# Patient Record
Sex: Male | Born: 1950
Health system: Southern US, Community
[De-identification: ages and names within clinical notes are randomized; demographics above are authoritative.]

## PROBLEM LIST (undated history)

## (undated) ENCOUNTER — Emergency Department (HOSPITAL_COMMUNITY): Admission: EM

## (undated) ENCOUNTER — Ambulatory Visit: Admission: EM | Payer: PPO

## (undated) DIAGNOSIS — N200 Calculus of kidney: Secondary | ICD-10-CM

## (undated) DIAGNOSIS — E119 Type 2 diabetes mellitus without complications: Secondary | ICD-10-CM

## (undated) DIAGNOSIS — H3530 Unspecified macular degeneration: Secondary | ICD-10-CM

## (undated) DIAGNOSIS — S3992XA Unspecified injury of lower back, initial encounter: Secondary | ICD-10-CM

## (undated) DIAGNOSIS — I2510 Atherosclerotic heart disease of native coronary artery without angina pectoris: Secondary | ICD-10-CM

## (undated) DIAGNOSIS — I509 Heart failure, unspecified: Secondary | ICD-10-CM

## (undated) DIAGNOSIS — M1990 Unspecified osteoarthritis, unspecified site: Secondary | ICD-10-CM

## (undated) DIAGNOSIS — R0683 Snoring: Secondary | ICD-10-CM

## (undated) DIAGNOSIS — I10 Essential (primary) hypertension: Secondary | ICD-10-CM

## (undated) DIAGNOSIS — Z794 Long term (current) use of insulin: Secondary | ICD-10-CM

## (undated) DIAGNOSIS — F329 Major depressive disorder, single episode, unspecified: Secondary | ICD-10-CM

## (undated) DIAGNOSIS — E785 Hyperlipidemia, unspecified: Secondary | ICD-10-CM

## (undated) DIAGNOSIS — F32A Depression, unspecified: Secondary | ICD-10-CM

## (undated) DIAGNOSIS — I119 Hypertensive heart disease without heart failure: Secondary | ICD-10-CM

## (undated) HISTORY — DX: Unspecified osteoarthritis, unspecified site: M19.90

## (undated) HISTORY — DX: Major depressive disorder, single episode, unspecified: F32.9

## (undated) HISTORY — PX: CORONARY ANGIOPLASTY: SHX604

## (undated) HISTORY — DX: Snoring: R06.83

## (undated) HISTORY — DX: Hyperlipidemia, unspecified: E78.5

## (undated) HISTORY — DX: Type 2 diabetes mellitus without complications: E11.9

## (undated) HISTORY — DX: Unspecified injury of lower back, initial encounter: S39.92XA

## (undated) HISTORY — DX: Calculus of kidney: N20.0

## (undated) HISTORY — DX: Essential (primary) hypertension: I10

## (undated) HISTORY — PX: CORONARY ARTERY BYPASS GRAFT: SHX141

## (undated) HISTORY — DX: Depression, unspecified: F32.A

## (undated) HISTORY — DX: Long term (current) use of insulin: Z79.4

## (undated) HISTORY — PX: CIRCUMCISION: SUR203

## (undated) HISTORY — DX: Hypertensive heart disease without heart failure: I11.9

---

## 1999-12-03 ENCOUNTER — Encounter: Payer: Self-pay | Admitting: Emergency Medicine

## 1999-12-03 ENCOUNTER — Emergency Department (HOSPITAL_COMMUNITY): Admission: EM | Admit: 1999-12-03 | Discharge: 1999-12-03 | Payer: Self-pay | Admitting: Emergency Medicine

## 2000-09-29 HISTORY — PX: CARDIAC CATHETERIZATION: SHX172

## 2000-10-04 ENCOUNTER — Inpatient Hospital Stay (HOSPITAL_COMMUNITY): Admission: EM | Admit: 2000-10-04 | Discharge: 2000-10-06 | Payer: Self-pay | Admitting: Emergency Medicine

## 2000-10-04 ENCOUNTER — Encounter: Payer: Self-pay | Admitting: Emergency Medicine

## 2002-02-26 DIAGNOSIS — S3992XA Unspecified injury of lower back, initial encounter: Secondary | ICD-10-CM

## 2002-02-26 HISTORY — DX: Unspecified injury of lower back, initial encounter: S39.92XA

## 2002-08-29 ENCOUNTER — Encounter: Payer: Self-pay | Admitting: Family Medicine

## 2002-08-29 LAB — CONVERTED CEMR LAB: PSA: 0.2 ng/mL

## 2002-10-04 ENCOUNTER — Encounter: Admission: RE | Admit: 2002-10-04 | Discharge: 2002-10-04 | Payer: Self-pay | Admitting: Family Medicine

## 2002-10-04 ENCOUNTER — Encounter: Payer: Self-pay | Admitting: Family Medicine

## 2002-10-18 ENCOUNTER — Encounter: Payer: Self-pay | Admitting: Family Medicine

## 2002-10-18 ENCOUNTER — Encounter: Admission: RE | Admit: 2002-10-18 | Discharge: 2002-10-18 | Payer: Self-pay | Admitting: Family Medicine

## 2003-07-21 ENCOUNTER — Ambulatory Visit (HOSPITAL_COMMUNITY): Admission: RE | Admit: 2003-07-21 | Discharge: 2003-07-23 | Payer: Self-pay | Admitting: Neurosurgery

## 2006-10-28 ENCOUNTER — Encounter: Payer: Self-pay | Admitting: Family Medicine

## 2006-10-28 LAB — CONVERTED CEMR LAB
Hgb A1c MFr Bld: 11.5 %
Microalbumin U total vol: NEGATIVE mg/L
PSA: 0.21 ng/mL

## 2006-11-21 ENCOUNTER — Ambulatory Visit: Payer: Self-pay | Admitting: Family Medicine

## 2006-11-21 LAB — CONVERTED CEMR LAB
ALT: 22 units/L (ref 0–40)
AST: 16 units/L (ref 0–37)
Albumin: 3.8 g/dL (ref 3.5–5.2)
Alkaline Phosphatase: 71 units/L (ref 39–117)
BUN: 14 mg/dL (ref 6–23)
Bilirubin, Direct: 0.1 mg/dL (ref 0.0–0.3)
CO2: 28 meq/L (ref 19–32)
Calcium: 9 mg/dL (ref 8.4–10.5)
Chloride: 101 meq/L (ref 96–112)
Cholesterol: 245 mg/dL (ref 0–200)
Creatinine, Ser: 0.9 mg/dL (ref 0.4–1.5)
Creatinine,U: 64.1 mg/dL
Direct LDL: 156.6 mg/dL
GFR calc Af Amer: 112 mL/min
GFR calc non Af Amer: 92 mL/min
Glucose, Bld: 354 mg/dL — ABNORMAL HIGH (ref 70–99)
HDL: 33.3 mg/dL — ABNORMAL LOW (ref >39.0)
Hgb A1c MFr Bld: 11.5 % — ABNORMAL HIGH (ref 4.6–6.0)
Microalb Creat Ratio: 14 mg/g (ref 0.0–30.0)
Microalb, Ur: 0.9 mg/dL (ref 0.0–1.9)
PSA: 0.21 ng/mL (ref 0.10–4.00)
Potassium: 5.1 meq/L (ref 3.5–5.1)
Sodium: 136 meq/L (ref 135–145)
TSH: 2.28 microintl units/mL (ref 0.35–5.50)
Total Bilirubin: 0.8 mg/dL (ref 0.3–1.2)
Total CHOL/HDL Ratio: 7.4
Total Protein: 6.9 g/dL (ref 6.0–8.3)
Triglycerides: 276 mg/dL (ref 0–149)
VLDL: 55 mg/dL — ABNORMAL HIGH (ref 0–40)

## 2006-12-05 ENCOUNTER — Ambulatory Visit: Payer: Self-pay | Admitting: Family Medicine

## 2006-12-05 ENCOUNTER — Encounter: Payer: Self-pay | Admitting: Family Medicine

## 2006-12-05 DIAGNOSIS — E785 Hyperlipidemia, unspecified: Secondary | ICD-10-CM

## 2006-12-05 DIAGNOSIS — E1159 Type 2 diabetes mellitus with other circulatory complications: Secondary | ICD-10-CM | POA: Insufficient documentation

## 2006-12-05 DIAGNOSIS — I10 Essential (primary) hypertension: Secondary | ICD-10-CM

## 2006-12-05 DIAGNOSIS — M1990 Unspecified osteoarthritis, unspecified site: Secondary | ICD-10-CM | POA: Insufficient documentation

## 2006-12-05 DIAGNOSIS — E1169 Type 2 diabetes mellitus with other specified complication: Secondary | ICD-10-CM | POA: Insufficient documentation

## 2006-12-14 ENCOUNTER — Ambulatory Visit: Payer: Self-pay | Admitting: Family Medicine

## 2006-12-14 LAB — CONVERTED CEMR LAB
BUN: 14 mg/dL (ref 6–23)
CO2: 27 meq/L (ref 19–32)
Calcium: 9.4 mg/dL (ref 8.4–10.5)
Chloride: 95 meq/L — ABNORMAL LOW (ref 96–112)
Creatinine, Ser: 1 mg/dL (ref 0.4–1.5)
GFR calc Af Amer: 99 mL/min
GFR calc non Af Amer: 82 mL/min
Glucose, Bld: 413 mg/dL — ABNORMAL HIGH (ref 70–99)
Potassium: 4.9 meq/L (ref 3.5–5.1)
Sodium: 134 meq/L — ABNORMAL LOW (ref 135–145)

## 2007-03-26 ENCOUNTER — Encounter: Payer: Self-pay | Admitting: Family Medicine

## 2007-03-26 DIAGNOSIS — Z87442 Personal history of urinary calculi: Secondary | ICD-10-CM | POA: Insufficient documentation

## 2007-04-10 ENCOUNTER — Ambulatory Visit: Payer: Self-pay | Admitting: Family Medicine

## 2007-04-10 DIAGNOSIS — F528 Other sexual dysfunction not due to a substance or known physiological condition: Secondary | ICD-10-CM | POA: Insufficient documentation

## 2007-04-11 ENCOUNTER — Ambulatory Visit: Payer: Self-pay | Admitting: Family Medicine

## 2007-04-13 LAB — CONVERTED CEMR LAB
BUN: 19 mg/dL (ref 6–23)
CO2: 28 meq/L (ref 19–32)
Calcium: 9.7 mg/dL (ref 8.4–10.5)
Chloride: 104 meq/L (ref 96–112)
Cholesterol: 235 mg/dL (ref 0–200)
Creatinine, Ser: 0.9 mg/dL (ref 0.4–1.5)
Direct LDL: 163.8 mg/dL
GFR calc Af Amer: 112 mL/min
GFR calc non Af Amer: 92 mL/min
Glucose, Bld: 288 mg/dL — ABNORMAL HIGH (ref 70–99)
HDL: 29.4 mg/dL — ABNORMAL LOW (ref >39.0)
Hgb A1c MFr Bld: 10.4 % — ABNORMAL HIGH (ref 4.6–6.0)
Potassium: 4.7 meq/L (ref 3.5–5.1)
Sodium: 140 meq/L (ref 135–145)
Total CHOL/HDL Ratio: 8
Triglycerides: 234 mg/dL (ref 0–149)
VLDL: 47 mg/dL — ABNORMAL HIGH (ref 0–40)

## 2007-06-21 ENCOUNTER — Ambulatory Visit: Payer: Self-pay | Admitting: Family Medicine

## 2007-06-29 ENCOUNTER — Ambulatory Visit: Payer: Self-pay | Admitting: Cardiology

## 2007-06-29 ENCOUNTER — Encounter (INDEPENDENT_AMBULATORY_CARE_PROVIDER_SITE_OTHER): Payer: Self-pay | Admitting: *Deleted

## 2007-06-29 ENCOUNTER — Observation Stay (HOSPITAL_COMMUNITY): Admission: EM | Admit: 2007-06-29 | Discharge: 2007-07-04 | Payer: Self-pay | Admitting: Emergency Medicine

## 2007-06-29 ENCOUNTER — Ambulatory Visit: Payer: Self-pay | Admitting: Internal Medicine

## 2007-06-30 ENCOUNTER — Encounter: Payer: Self-pay | Admitting: Internal Medicine

## 2007-06-30 HISTORY — PX: CARDIAC CATHETERIZATION: SHX172

## 2007-07-01 ENCOUNTER — Encounter: Payer: Self-pay | Admitting: Internal Medicine

## 2007-07-03 ENCOUNTER — Ambulatory Visit: Payer: Self-pay | Admitting: Internal Medicine

## 2007-07-03 ENCOUNTER — Encounter: Payer: Self-pay | Admitting: Family Medicine

## 2007-07-06 ENCOUNTER — Ambulatory Visit: Payer: Self-pay | Admitting: Family Medicine

## 2007-07-06 ENCOUNTER — Encounter (INDEPENDENT_AMBULATORY_CARE_PROVIDER_SITE_OTHER): Payer: Self-pay | Admitting: *Deleted

## 2007-07-06 DIAGNOSIS — F331 Major depressive disorder, recurrent, moderate: Secondary | ICD-10-CM | POA: Insufficient documentation

## 2007-09-05 ENCOUNTER — Telehealth: Payer: Self-pay | Admitting: Family Medicine

## 2007-10-09 ENCOUNTER — Ambulatory Visit: Payer: Self-pay | Admitting: Family Medicine

## 2007-10-09 LAB — CONVERTED CEMR LAB
Cholesterol, target level: 200 mg/dL
HDL goal, serum: 40 mg/dL
LDL Goal: 70 mg/dL

## 2007-10-11 LAB — CONVERTED CEMR LAB: Hgb A1c MFr Bld: 10.2 % — ABNORMAL HIGH (ref 4.6–6.0)

## 2007-10-17 ENCOUNTER — Ambulatory Visit: Payer: Self-pay | Admitting: Family Medicine

## 2007-10-26 ENCOUNTER — Encounter (INDEPENDENT_AMBULATORY_CARE_PROVIDER_SITE_OTHER): Payer: Self-pay | Admitting: *Deleted

## 2007-10-26 LAB — CONVERTED CEMR LAB
BUN: 13 mg/dL (ref 6–23)
CO2: 30 meq/L (ref 19–32)
Calcium: 8.7 mg/dL (ref 8.4–10.5)
Chloride: 104 meq/L (ref 96–112)
Creatinine, Ser: 1.1 mg/dL (ref 0.4–1.5)
GFR calc Af Amer: 89 mL/min
GFR calc non Af Amer: 73 mL/min
Glucose, Bld: 266 mg/dL — ABNORMAL HIGH (ref 70–99)
Hgb A1c MFr Bld: 10.2 % — ABNORMAL HIGH (ref 4.6–6.0)
Potassium: 4.4 meq/L (ref 3.5–5.1)
Sodium: 140 meq/L (ref 135–145)

## 2007-11-20 ENCOUNTER — Encounter: Payer: Self-pay | Admitting: Family Medicine

## 2007-11-22 ENCOUNTER — Ambulatory Visit: Payer: Self-pay | Admitting: Family Medicine

## 2007-11-23 ENCOUNTER — Ambulatory Visit (HOSPITAL_BASED_OUTPATIENT_CLINIC_OR_DEPARTMENT_OTHER): Admission: RE | Admit: 2007-11-23 | Discharge: 2007-11-23 | Payer: Self-pay | Admitting: Urology

## 2007-11-23 ENCOUNTER — Encounter (INDEPENDENT_AMBULATORY_CARE_PROVIDER_SITE_OTHER): Payer: Self-pay | Admitting: Urology

## 2007-12-13 ENCOUNTER — Telehealth: Payer: Self-pay | Admitting: Family Medicine

## 2007-12-25 ENCOUNTER — Ambulatory Visit: Payer: Self-pay | Admitting: Family Medicine

## 2008-01-08 ENCOUNTER — Ambulatory Visit: Payer: Self-pay | Admitting: Family Medicine

## 2008-01-10 ENCOUNTER — Ambulatory Visit: Payer: Self-pay | Admitting: Family Medicine

## 2008-01-11 LAB — CONVERTED CEMR LAB
ALT: 16 units/L (ref 0–53)
AST: 14 units/L (ref 0–37)
Albumin: 3.6 g/dL (ref 3.5–5.2)
Alkaline Phosphatase: 81 units/L (ref 39–117)
BUN: 16 mg/dL (ref 6–23)
Bilirubin, Direct: 0.1 mg/dL (ref 0.0–0.3)
CO2: 29 meq/L (ref 19–32)
Calcium: 9 mg/dL (ref 8.4–10.5)
Chloride: 108 meq/L (ref 96–112)
Cholesterol: 246 mg/dL (ref 0–200)
Creatinine, Ser: 0.9 mg/dL (ref 0.4–1.5)
Direct LDL: 186.1 mg/dL
GFR calc Af Amer: 111 mL/min
GFR calc non Af Amer: 92 mL/min
Glucose, Bld: 372 mg/dL — ABNORMAL HIGH (ref 70–99)
HDL: 27.7 mg/dL — ABNORMAL LOW (ref >39.0)
Hgb A1c MFr Bld: 10.6 % — ABNORMAL HIGH (ref 4.6–6.0)
Potassium: 5 meq/L (ref 3.5–5.1)
Sodium: 140 meq/L (ref 135–145)
Total Bilirubin: 0.6 mg/dL (ref 0.3–1.2)
Total CHOL/HDL Ratio: 8.9
Total Protein: 7 g/dL (ref 6.0–8.3)
Triglycerides: 165 mg/dL — ABNORMAL HIGH (ref 0–149)
VLDL: 33 mg/dL (ref 0–40)

## 2008-03-19 ENCOUNTER — Encounter (INDEPENDENT_AMBULATORY_CARE_PROVIDER_SITE_OTHER): Payer: Self-pay | Admitting: *Deleted

## 2008-03-19 ENCOUNTER — Telehealth: Payer: Self-pay | Admitting: Family Medicine

## 2008-03-19 ENCOUNTER — Emergency Department (HOSPITAL_COMMUNITY): Admission: EM | Admit: 2008-03-19 | Discharge: 2008-03-20 | Payer: Self-pay | Admitting: Emergency Medicine

## 2008-03-20 ENCOUNTER — Ambulatory Visit: Payer: Self-pay | Admitting: Family Medicine

## 2008-03-21 ENCOUNTER — Encounter: Payer: Self-pay | Admitting: Family Medicine

## 2008-03-21 ENCOUNTER — Telehealth: Payer: Self-pay | Admitting: Family Medicine

## 2008-03-25 ENCOUNTER — Telehealth: Payer: Self-pay | Admitting: Family Medicine

## 2008-04-09 ENCOUNTER — Ambulatory Visit: Payer: Self-pay | Admitting: Family Medicine

## 2008-04-09 LAB — CONVERTED CEMR LAB
ALT: 19 units/L (ref 0–53)
AST: 15 units/L (ref 0–37)
Albumin: 3.6 g/dL (ref 3.5–5.2)
Alkaline Phosphatase: 99 units/L (ref 39–117)
Bilirubin, Direct: 0.1 mg/dL (ref 0.0–0.3)
Cholesterol: 174 mg/dL (ref 0–200)
HDL: 28.7 mg/dL — ABNORMAL LOW (ref >39.0)
Hgb A1c MFr Bld: 12.8 % — ABNORMAL HIGH (ref 4.6–6.0)
LDL Cholesterol: 120 mg/dL — ABNORMAL HIGH (ref 0–99)
Total Bilirubin: 0.6 mg/dL (ref 0.3–1.2)
Total CHOL/HDL Ratio: 6.1
Total Protein: 6.9 g/dL (ref 6.0–8.3)
Triglycerides: 129 mg/dL (ref 0–149)
VLDL: 26 mg/dL (ref 0–40)

## 2008-04-14 ENCOUNTER — Ambulatory Visit: Payer: Self-pay | Admitting: Family Medicine

## 2008-07-09 ENCOUNTER — Ambulatory Visit: Payer: Self-pay | Admitting: Family Medicine

## 2008-07-11 LAB — CONVERTED CEMR LAB
ALT: 17 units/L (ref 0–53)
AST: 13 units/L (ref 0–37)
Albumin: 3.5 g/dL (ref 3.5–5.2)
Alkaline Phosphatase: 70 units/L (ref 39–117)
BUN: 16 mg/dL (ref 6–23)
Bilirubin, Direct: 0.1 mg/dL (ref 0.0–0.3)
CO2: 28 meq/L (ref 19–32)
Calcium: 9.2 mg/dL (ref 8.4–10.5)
Chloride: 106 meq/L (ref 96–112)
Cholesterol: 192 mg/dL (ref 0–200)
Creatinine, Ser: 0.9 mg/dL (ref 0.4–1.5)
Creatinine,U: 159.7 mg/dL
GFR calc Af Amer: 111 mL/min
GFR calc non Af Amer: 92 mL/min
Glucose, Bld: 207 mg/dL — ABNORMAL HIGH (ref 70–99)
HDL: 32.3 mg/dL — ABNORMAL LOW (ref >39.0)
Hgb A1c MFr Bld: 11.9 % — ABNORMAL HIGH (ref 4.6–6.0)
LDL Cholesterol: 142 mg/dL — ABNORMAL HIGH (ref 0–99)
Microalb Creat Ratio: 3.1 mg/g (ref 0.0–30.0)
Microalb, Ur: 0.5 mg/dL (ref 0.0–1.9)
PSA: 0.25 ng/mL (ref 0.10–4.00)
Potassium: 5.2 meq/L — ABNORMAL HIGH (ref 3.5–5.1)
Sodium: 141 meq/L (ref 135–145)
Total Bilirubin: 0.7 mg/dL (ref 0.3–1.2)
Total CHOL/HDL Ratio: 5.9
Total Protein: 6.8 g/dL (ref 6.0–8.3)
Triglycerides: 88 mg/dL (ref 0–149)
VLDL: 18 mg/dL (ref 0–40)

## 2008-07-14 ENCOUNTER — Ambulatory Visit: Payer: Self-pay | Admitting: Family Medicine

## 2008-07-22 ENCOUNTER — Encounter: Payer: Self-pay | Admitting: Family Medicine

## 2008-10-17 ENCOUNTER — Encounter (INDEPENDENT_AMBULATORY_CARE_PROVIDER_SITE_OTHER): Payer: Self-pay | Admitting: *Deleted

## 2008-11-04 ENCOUNTER — Encounter: Payer: Self-pay | Admitting: Family Medicine

## 2009-01-12 ENCOUNTER — Ambulatory Visit: Payer: Self-pay | Admitting: Family Medicine

## 2009-01-15 LAB — CONVERTED CEMR LAB
ALT: 16 units/L (ref 0–53)
AST: 15 units/L (ref 0–37)
Albumin: 3.7 g/dL (ref 3.5–5.2)
Alkaline Phosphatase: 83 units/L (ref 39–117)
BUN: 13 mg/dL (ref 6–23)
Bilirubin, Direct: 0 mg/dL (ref 0.0–0.3)
CO2: 30 meq/L (ref 19–32)
Calcium: 8.9 mg/dL (ref 8.4–10.5)
Chloride: 110 meq/L (ref 96–112)
Cholesterol: 228 mg/dL — ABNORMAL HIGH (ref 0–200)
Creatinine, Ser: 0.9 mg/dL (ref 0.4–1.5)
Creatinine,U: 101.3 mg/dL
Direct LDL: 183.3 mg/dL
GFR calc non Af Amer: 91.74 mL/min (ref >60)
Glucose, Bld: 288 mg/dL — ABNORMAL HIGH (ref 70–99)
HDL: 33.1 mg/dL — ABNORMAL LOW (ref >39.00)
Hgb A1c MFr Bld: 11 % — ABNORMAL HIGH (ref 4.6–6.5)
Microalb Creat Ratio: 4.9 mg/g (ref 0.0–30.0)
Microalb, Ur: 0.5 mg/dL (ref 0.0–1.9)
Potassium: 4.7 meq/L (ref 3.5–5.1)
Sodium: 143 meq/L (ref 135–145)
Total Bilirubin: 0.7 mg/dL (ref 0.3–1.2)
Total CHOL/HDL Ratio: 7
Total Protein: 7.2 g/dL (ref 6.0–8.3)
Triglycerides: 103 mg/dL (ref 0.0–149.0)
VLDL: 20.6 mg/dL (ref 0.0–40.0)

## 2009-05-19 ENCOUNTER — Ambulatory Visit: Payer: Self-pay | Admitting: Cardiology

## 2009-05-19 ENCOUNTER — Observation Stay (HOSPITAL_COMMUNITY): Admission: EM | Admit: 2009-05-19 | Discharge: 2009-05-20 | Payer: Self-pay | Admitting: Emergency Medicine

## 2009-05-21 ENCOUNTER — Telehealth: Payer: Self-pay | Admitting: Family Medicine

## 2009-06-02 ENCOUNTER — Ambulatory Visit: Payer: Self-pay | Admitting: Family Medicine

## 2009-06-04 ENCOUNTER — Encounter: Payer: Self-pay | Admitting: Family Medicine

## 2009-06-08 ENCOUNTER — Telehealth: Payer: Self-pay | Admitting: Family Medicine

## 2009-06-11 ENCOUNTER — Ambulatory Visit: Payer: Self-pay | Admitting: Family Medicine

## 2009-06-12 LAB — CONVERTED CEMR LAB
ALT: 19 units/L (ref 0–53)
AST: 19 units/L (ref 0–37)
Albumin: 4 g/dL (ref 3.5–5.2)
Alkaline Phosphatase: 66 units/L (ref 39–117)
BUN: 15 mg/dL (ref 6–23)
Bilirubin, Direct: 0 mg/dL (ref 0.0–0.3)
CO2: 31 meq/L (ref 19–32)
Calcium: 9 mg/dL (ref 8.4–10.5)
Chloride: 102 meq/L (ref 96–112)
Creatinine, Ser: 0.8 mg/dL (ref 0.4–1.5)
GFR calc non Af Amer: 104.95 mL/min (ref >60)
Glucose, Bld: 75 mg/dL (ref 70–99)
Lipase: 12 units/L (ref 11.0–59.0)
Potassium: 4 meq/L (ref 3.5–5.1)
Sodium: 142 meq/L (ref 135–145)
Total Bilirubin: 0.8 mg/dL (ref 0.3–1.2)
Total Protein: 7.5 g/dL (ref 6.0–8.3)

## 2009-06-15 ENCOUNTER — Encounter: Payer: Self-pay | Admitting: Family Medicine

## 2009-06-15 ENCOUNTER — Ambulatory Visit: Payer: Self-pay | Admitting: Family Medicine

## 2009-06-18 ENCOUNTER — Telehealth (INDEPENDENT_AMBULATORY_CARE_PROVIDER_SITE_OTHER): Payer: Self-pay

## 2009-06-19 ENCOUNTER — Ambulatory Visit: Payer: Self-pay | Admitting: Internal Medicine

## 2009-06-25 ENCOUNTER — Encounter: Payer: Self-pay | Admitting: Internal Medicine

## 2009-06-25 ENCOUNTER — Ambulatory Visit: Payer: Self-pay | Admitting: Internal Medicine

## 2009-06-26 ENCOUNTER — Ambulatory Visit: Payer: Self-pay | Admitting: Family Medicine

## 2009-06-26 LAB — CONVERTED CEMR LAB
ALT: 17 units/L (ref 0–53)
AST: 15 units/L (ref 0–37)
Albumin: 3.8 g/dL (ref 3.5–5.2)
Alkaline Phosphatase: 70 units/L (ref 39–117)
BUN: 16 mg/dL (ref 6–23)
Bilirubin, Direct: 0 mg/dL (ref 0.0–0.3)
CO2: 31 meq/L (ref 19–32)
Calcium: 8.7 mg/dL (ref 8.4–10.5)
Chloride: 101 meq/L (ref 96–112)
Cholesterol: 252 mg/dL — ABNORMAL HIGH (ref 0–200)
Creatinine, Ser: 1 mg/dL (ref 0.4–1.5)
Direct LDL: 203.4 mg/dL
GFR calc non Af Amer: 81.11 mL/min (ref >60)
Glucose, Bld: 198 mg/dL — ABNORMAL HIGH (ref 70–99)
HDL: 35.5 mg/dL — ABNORMAL LOW (ref >39.00)
Hgb A1c MFr Bld: 8.6 % — ABNORMAL HIGH (ref 4.6–6.5)
Potassium: 4.6 meq/L (ref 3.5–5.1)
Sodium: 142 meq/L (ref 135–145)
Total Bilirubin: 0.5 mg/dL (ref 0.3–1.2)
Total CHOL/HDL Ratio: 7
Total Protein: 7.3 g/dL (ref 6.0–8.3)
Triglycerides: 152 mg/dL — ABNORMAL HIGH (ref 0.0–149.0)
VLDL: 30.4 mg/dL (ref 0.0–40.0)

## 2009-06-29 ENCOUNTER — Encounter: Payer: Self-pay | Admitting: Internal Medicine

## 2009-06-30 ENCOUNTER — Encounter: Payer: Self-pay | Admitting: Internal Medicine

## 2009-07-03 ENCOUNTER — Ambulatory Visit: Payer: Self-pay | Admitting: Family Medicine

## 2009-07-17 ENCOUNTER — Ambulatory Visit: Payer: Self-pay | Admitting: Family Medicine

## 2009-07-17 LAB — CONVERTED CEMR LAB
BUN: 21 mg/dL (ref 6–23)
CO2: 32 meq/L (ref 19–32)
Calcium: 9.4 mg/dL (ref 8.4–10.5)
Chloride: 108 meq/L (ref 96–112)
Creatinine, Ser: 0.9 mg/dL (ref 0.4–1.5)
GFR calc non Af Amer: 91.58 mL/min (ref >60)
Glucose, Bld: 103 mg/dL — ABNORMAL HIGH (ref 70–99)
Potassium: 4.3 meq/L (ref 3.5–5.1)
Sodium: 144 meq/L (ref 135–145)

## 2009-07-29 DIAGNOSIS — K2970 Gastritis, unspecified, without bleeding: Secondary | ICD-10-CM | POA: Insufficient documentation

## 2009-07-29 DIAGNOSIS — K2990 Gastroduodenitis, unspecified, without bleeding: Secondary | ICD-10-CM

## 2009-07-31 ENCOUNTER — Ambulatory Visit: Payer: Self-pay | Admitting: Internal Medicine

## 2009-08-05 ENCOUNTER — Telehealth: Payer: Self-pay | Admitting: Internal Medicine

## 2009-08-07 ENCOUNTER — Telehealth: Payer: Self-pay | Admitting: Family Medicine

## 2009-08-10 ENCOUNTER — Ambulatory Visit: Payer: Self-pay | Admitting: Family Medicine

## 2009-08-10 DIAGNOSIS — K5900 Constipation, unspecified: Secondary | ICD-10-CM | POA: Insufficient documentation

## 2009-09-01 ENCOUNTER — Emergency Department (HOSPITAL_COMMUNITY): Admission: EM | Admit: 2009-09-01 | Discharge: 2009-09-01 | Payer: Self-pay | Admitting: Emergency Medicine

## 2009-09-03 ENCOUNTER — Ambulatory Visit: Payer: Self-pay | Admitting: Internal Medicine

## 2009-09-03 LAB — HM COLONOSCOPY

## 2009-10-06 ENCOUNTER — Ambulatory Visit: Payer: Self-pay | Admitting: Family Medicine

## 2009-10-06 DIAGNOSIS — M79606 Pain in leg, unspecified: Secondary | ICD-10-CM | POA: Insufficient documentation

## 2009-10-07 ENCOUNTER — Encounter: Payer: Self-pay | Admitting: Family Medicine

## 2009-10-07 LAB — CONVERTED CEMR LAB
ALT: 20 units/L (ref 0–53)
AST: 15 units/L (ref 0–37)
Albumin: 3.7 g/dL (ref 3.5–5.2)
Alkaline Phosphatase: 88 units/L (ref 39–117)
BUN: 16 mg/dL (ref 6–23)
Bilirubin, Direct: 0.1 mg/dL (ref 0.0–0.3)
CO2: 30 meq/L (ref 19–32)
Calcium: 9.3 mg/dL (ref 8.4–10.5)
Chloride: 101 meq/L (ref 96–112)
Cholesterol: 208 mg/dL — ABNORMAL HIGH (ref 0–200)
Creatinine, Ser: 0.9 mg/dL (ref 0.4–1.5)
Direct LDL: 153.2 mg/dL
GFR calc non Af Amer: 91.51 mL/min (ref >60)
Glucose, Bld: 331 mg/dL — ABNORMAL HIGH (ref 70–99)
HDL: 36.7 mg/dL — ABNORMAL LOW (ref >39.00)
Hgb A1c MFr Bld: 9.5 % — ABNORMAL HIGH (ref 4.6–6.5)
PSA: 0.21 ng/mL (ref 0.10–4.00)
Potassium: 4.8 meq/L (ref 3.5–5.1)
Sodium: 138 meq/L (ref 135–145)
Total Bilirubin: 0.7 mg/dL (ref 0.3–1.2)
Total CHOL/HDL Ratio: 6
Total Protein: 7.2 g/dL (ref 6.0–8.3)
Triglycerides: 167 mg/dL — ABNORMAL HIGH (ref 0.0–149.0)
VLDL: 33.4 mg/dL (ref 0.0–40.0)

## 2009-10-08 ENCOUNTER — Encounter: Payer: Self-pay | Admitting: Family Medicine

## 2009-10-08 ENCOUNTER — Ambulatory Visit: Payer: Self-pay

## 2009-10-16 ENCOUNTER — Encounter: Payer: Self-pay | Admitting: Family Medicine

## 2009-11-20 ENCOUNTER — Ambulatory Visit: Payer: Self-pay | Admitting: Family Medicine

## 2009-11-20 LAB — HM DIABETES FOOT EXAM

## 2009-11-26 ENCOUNTER — Ambulatory Visit: Payer: Self-pay | Admitting: Family Medicine

## 2009-11-26 DIAGNOSIS — H348392 Tributary (branch) retinal vein occlusion, unspecified eye, stable: Secondary | ICD-10-CM | POA: Insufficient documentation

## 2009-12-03 ENCOUNTER — Encounter: Payer: Self-pay | Admitting: Family Medicine

## 2009-12-03 LAB — HM DIABETES EYE EXAM

## 2009-12-21 ENCOUNTER — Ambulatory Visit: Payer: Self-pay | Admitting: Family Medicine

## 2009-12-24 ENCOUNTER — Encounter: Payer: Self-pay | Admitting: Family Medicine

## 2010-01-04 ENCOUNTER — Ambulatory Visit: Payer: Self-pay | Admitting: Family Medicine

## 2010-01-07 LAB — CONVERTED CEMR LAB
Hgb A1c MFr Bld: 8.2 % — ABNORMAL HIGH (ref 4.6–6.5)
PSA: 0.36 ng/mL (ref 0.10–4.00)

## 2010-01-08 ENCOUNTER — Ambulatory Visit: Payer: Self-pay | Admitting: Family Medicine

## 2010-09-14 ENCOUNTER — Ambulatory Visit
Admission: RE | Admit: 2010-09-14 | Discharge: 2010-09-14 | Payer: Self-pay | Source: Home / Self Care | Attending: Family Medicine | Admitting: Family Medicine

## 2010-09-14 DIAGNOSIS — M6530 Trigger finger, unspecified finger: Secondary | ICD-10-CM | POA: Insufficient documentation

## 2010-09-17 ENCOUNTER — Encounter: Payer: Self-pay | Admitting: Family Medicine

## 2010-09-17 ENCOUNTER — Other Ambulatory Visit: Payer: Self-pay | Admitting: Family Medicine

## 2010-09-17 ENCOUNTER — Ambulatory Visit
Admission: RE | Admit: 2010-09-17 | Discharge: 2010-09-17 | Payer: Self-pay | Source: Home / Self Care | Attending: Family Medicine | Admitting: Family Medicine

## 2010-09-17 DIAGNOSIS — R5381 Other malaise: Secondary | ICD-10-CM | POA: Insufficient documentation

## 2010-09-17 DIAGNOSIS — R5383 Other fatigue: Secondary | ICD-10-CM

## 2010-09-17 LAB — BASIC METABOLIC PANEL
BUN: 17 mg/dL (ref 6–23)
CO2: 30 mEq/L (ref 19–32)
Calcium: 8.7 mg/dL (ref 8.4–10.5)
Chloride: 105 mEq/L (ref 96–112)
Creatinine, Ser: 0.9 mg/dL (ref 0.4–1.5)
GFR: 92.41 mL/min (ref >60.00)
Glucose, Bld: 133 mg/dL — ABNORMAL HIGH (ref 70–99)
Potassium: 4.3 mEq/L (ref 3.5–5.1)
Sodium: 142 mEq/L (ref 135–145)

## 2010-09-17 LAB — LIPID PANEL
Cholesterol: 186 mg/dL (ref 0–200)
HDL: 29.2 mg/dL — ABNORMAL LOW (ref >39.00)
LDL Cholesterol: 131 mg/dL — ABNORMAL HIGH (ref 0–99)
Total CHOL/HDL Ratio: 6
Triglycerides: 127 mg/dL (ref 0.0–149.0)
VLDL: 25.4 mg/dL (ref 0.0–40.0)

## 2010-09-17 LAB — HEPATIC FUNCTION PANEL
ALT: 25 U/L (ref 0–53)
AST: 19 U/L (ref 0–37)
Albumin: 3.6 g/dL (ref 3.5–5.2)
Alkaline Phosphatase: 70 U/L (ref 39–117)
Bilirubin, Direct: 0.1 mg/dL (ref 0.0–0.3)
Total Bilirubin: 0.4 mg/dL (ref 0.3–1.2)
Total Protein: 6.4 g/dL (ref 6.0–8.3)

## 2010-09-17 LAB — CBC WITH DIFFERENTIAL/PLATELET
Basophils Absolute: 0 10*3/uL (ref 0.0–0.1)
Basophils Relative: 0.4 % (ref 0.0–3.0)
Eosinophils Absolute: 0.1 10*3/uL (ref 0.0–0.7)
Eosinophils Relative: 2.2 % (ref 0.0–5.0)
HCT: 39 % (ref 39.0–52.0)
Hemoglobin: 13.3 g/dL (ref 13.0–17.0)
Lymphocytes Relative: 32.9 % (ref 12.0–46.0)
Lymphs Abs: 1.7 10*3/uL (ref 0.7–4.0)
MCHC: 34 g/dL (ref 30.0–36.0)
MCV: 89.3 fl (ref 78.0–100.0)
Monocytes Absolute: 0.4 10*3/uL (ref 0.1–1.0)
Monocytes Relative: 7.2 % (ref 3.0–12.0)
Neutro Abs: 3 10*3/uL (ref 1.4–7.7)
Neutrophils Relative %: 57.3 % (ref 43.0–77.0)
Platelets: 194 10*3/uL (ref 150.0–400.0)
RBC: 4.36 Mil/uL (ref 4.22–5.81)
RDW: 14.1 % (ref 11.5–14.6)
WBC: 5.3 10*3/uL (ref 4.5–10.5)

## 2010-09-17 LAB — VITAMIN B12: Vitamin B-12: 209 pg/mL — ABNORMAL LOW (ref 211–911)

## 2010-09-17 LAB — TESTOSTERONE: Testosterone: 269.2 ng/dL — ABNORMAL LOW (ref 350.00–890.00)

## 2010-09-17 LAB — HEMOGLOBIN A1C: Hgb A1c MFr Bld: 9.4 % — ABNORMAL HIGH (ref 4.6–6.5)

## 2010-09-27 LAB — CONVERTED CEMR LAB: Vit D, 25-Hydroxy: 24 ng/mL — ABNORMAL LOW (ref 30–89)

## 2010-09-28 ENCOUNTER — Encounter: Payer: Self-pay | Admitting: Family Medicine

## 2010-09-28 ENCOUNTER — Ambulatory Visit
Admission: RE | Admit: 2010-09-28 | Discharge: 2010-09-28 | Payer: Self-pay | Source: Home / Self Care | Attending: Family Medicine | Admitting: Family Medicine

## 2010-09-28 ENCOUNTER — Other Ambulatory Visit: Payer: Self-pay | Admitting: Family Medicine

## 2010-09-28 DIAGNOSIS — E291 Testicular hypofunction: Secondary | ICD-10-CM | POA: Insufficient documentation

## 2010-09-28 DIAGNOSIS — E559 Vitamin D deficiency, unspecified: Secondary | ICD-10-CM | POA: Insufficient documentation

## 2010-09-28 DIAGNOSIS — E538 Deficiency of other specified B group vitamins: Secondary | ICD-10-CM | POA: Insufficient documentation

## 2010-09-28 LAB — TESTOSTERONE: Testosterone: 232.82 ng/dL — ABNORMAL LOW (ref 350.00–890.00)

## 2010-09-28 LAB — FOLLICLE STIMULATING HORMONE: FSH: 9.9 m[IU]/mL (ref 1.4–18.1)

## 2010-09-28 LAB — TSH: TSH: 1.73 u[IU]/mL (ref 0.35–5.50)

## 2010-09-28 LAB — LUTEINIZING HORMONE: LH: 5.96 m[IU]/mL (ref 1.50–9.30)

## 2010-09-28 NOTE — Assessment & Plan Note (Signed)
Summary: FOLLOW UP/EVE  Medications Added GLIPIZIDE 5 MG  TABS (GLIPIZIDE) Take 1 tablet by mouth once a day ALTACE 2.5 MG CAPS (RAMIPRIL) Take 1 capsule by mouth once a day DIFLUCAN 150 MG TABS (FLUCONAZOLE) Take 1 tablet by mouth single dose AVANDAMET 2-500 MG TABS (ROSIGLITAZONE-METFORMIN) Take 1 tablet by mouth twice a day MENS MULTIVITAMIN PLUS   TABS (MULTIPLE VITAMINS-MINERALS) once daily ENALAPRIL MALEATE 20 MG  TABS (ENALAPRIL MALEATE) Take 1 tablet by mouth once a day CIALIS 20 MG  TABS (TADALAFIL) 1/2-1 tab by mouth daily as needed ED        Vital Signs:  Patient Profile:   60 Years Old Male Height:     68.5 inches Weight:      272.6 pounds Temp:     97.8 degrees F oral Pulse rate:   100 / minute Pulse rhythm:   regular BP sitting:   150 / 86  (left arm) Cuff size:   large  Vitals Entered By: [name] (April 10, 2007 3:16 PM)               Chief Complaint:  Follow up.  Diabetes Management History:      He is (or has been) enrolled in the "Diabetic Education Program".  He states understanding of dietary principles and is following his diet appropriately.  He is checking home blood sugars.  He says that he is not exercising regularly.        Hypoglycemic symptoms are not occurring.        Other questions/concerns include: Was given rx of glipizide for once daily so has not been taking it two times a day. He is having significant gas from metformin.        His home fasting blood sugars are as follows: highest: 230; lowest: 180.    Hypertension History:      He notes no problems with any antihypertensive medication side effects.        Positive major cardiovascular risk factors include male age 91 years old or older, diabetes, hyperlipidemia, and hypertension.  Negative major cardiovascular risk factors include non-tobacco-user status.        Positive history for target organ damage include ASHD (either angina; prior MI; prior CABG).      Current  Allergies: ! LIPITOR (ATORVASTATIN CALCIUM)  Past Medical History:    Reviewed history from 12/05/2006 and no changes required:       Coronary artery disease       Diabetes mellitus, type II       Hyperlipidemia       Hypertension       Osteoarthritis   Family History:    Reviewed history from 12/05/2006 and no changes required:       father died age 16 Dm, MI       mother age 63 Alzheimer's, emphesema       Family History of CAD Male 1st degree relative <50       Family History Diabetes 1st degree relative       brother ? neck cancer   Risk Factors:  Exercise:  no   Review of Systems      See HPI   Physical Exam  General:     overweight-appearing.   Lungs:     Normal respiratory effort, chest expands symmetrically. Lungs are clear to auscultation, no crackles or wheezes. Heart:     Normal rate and regular rhythm. S1 and S2 normal without gallop, murmur, click,  rub or other extra sounds.  Diabetes Management Exam:    Foot Exam (with socks and/or shoes not present):       Sensory-Pinprick/Light touch:          Left medial foot (L-4): normal          Left dorsal foot (L-5): normal          Left lateral foot (S-1): normal          Right medial foot (L-4): normal          Right dorsal foot (L-5): normal          Right lateral foot (S-1): normal       Sensory-Monofilament:          Left foot: normal          Right foot: normal       Inspection:          Left foot: normal          Right foot: normal       Nails:          Left foot: normal          Right foot: normal    Impression & Recommendations:  Problem # 1:  HYPERTENSION (ICD-401.9) Not at goal.  Increase enalapril to 20 mg daily.  Check Cr after adjustment. Encouraged exercise, weight loss, healthy eating habits.  The following medications were removed from the medication list:    Altace 2.5 Mg Caps (Ramipril) .Marland Kitchen... Take 1 capsule by mouth once a day  His updated medication list for this problem  includes:    Enalapril Maleate 10 Mg Tabs (Enalapril maleate) ..... Once daily    Enalapril Maleate 20 Mg Tabs (Enalapril maleate) .Marland Kitchen... Take 1 tablet by mouth once a day   Problem # 2:  HYPERLIPIDEMIA (ICD-272.4) likely poorly controlled still, but pt has lost 9 pounds.  Recheck lipids and consider medication if needed.  Problem # 3:  DIABETES MELLITUS, TYPE II (ICD-250.00) Likely not at goal.  Pt was unable to increase glipizide becaus eonly recieved 30 tabs per month.  Will likely need to increase.  He would like to avoid increasing metformin if possible due to SE of gas that he is already experiencing.  The following medications were removed from the medication list:    Altace 2.5 Mg Caps (Ramipril) .Marland Kitchen... Take 1 capsule by mouth once a day    Avandamet 2-500 Mg Tabs (Rosiglitazone-metformin) .Marland Kitchen... Take 1 tablet by mouth twice a day  His updated medication list for this problem includes:    Metformin Hcl 1000 Mg Tabs (Metformin hcl) .Marland Kitchen..Marland Kitchen Two times a day    Glipizide 5 Mg Tabs (Glipizide) .Marland Kitchen... Take 1 tablet by mouth once a day    Enalapril Maleate 10 Mg Tabs (Enalapril maleate) ..... Once daily    Enalapril Maleate 20 Mg Tabs (Enalapril maleate) .Marland Kitchen... Take 1 tablet by mouth once a day   Problem # 4:  ERECTILE DYSFUNCTION (ICD-302.72)  His updated medication list for this problem includes:    Cialis 20 Mg Tabs (Tadalafil) .Marland Kitchen... 1/2-1 tab by mouth daily as needed ed   Complete Medication List: 1)  Metformin Hcl 1000 Mg Tabs (Metformin hcl) .... Two times a day 2)  Glipizide 5 Mg Tabs (Glipizide) .... Take 1 tablet by mouth once a day 3)  Enalapril Maleate 10 Mg Tabs (Enalapril maleate) .... Once daily 4)  Advil 200 Mg Caps (Ibuprofen) .... As needed 5)  Mens Multivitamin Plus Tabs (Multiple vitamins-minerals) .... Once daily 6)  Enalapril Maleate 20 Mg Tabs (Enalapril maleate) .... Take 1 tablet by mouth once a day 7)  Cialis 20 Mg Tabs (Tadalafil) .... 1/2-1 tab by mouth daily  as needed ed  Diabetes Management Assessment/Plan:      The following lipid goals have been established for the patient: Total cholesterol goal of 200; LDL cholesterol goal of 100; HDL cholesterol goal of 40; Triglyceride goal of 200.  His blood pressure goal is < 130/80.    Hypertension Assessment/Plan:      [name] hypertensive risk group is category C: Target organ damage and/or diabetes.  Today's blood pressure is 150/86.  His blood pressure goal is < 130/80.   Patient Instructions: 1)  Fasting lab appointment Dx 250.00 HgA1C , BMET, lipids 2)  Increase enalapril to 20 mg daily. 3)  Please schedule a follow-up appointment in 3 months. 4)  HbgA1C prior to visit, ICD-9: 250.00    Prescriptions: CIALIS 20 MG  TABS (TADALAFIL) 1/2-1 tab by mouth daily as needed ED  #6 x 0   Entered and Authorized by:   Kerby Nora MD   Signed by:   Kerby Nora MD on 04/11/2007   Method used:   Samples Given   RxID:   [id number] ENALAPRIL MALEATE 20 MG  TABS (ENALAPRIL MALEATE) Take 1 tablet by mouth once a day  #30 x 6   Entered and Authorized by:   Kerby Nora MD   Signed by:   Kerby Nora MD on 04/10/2007   Method used:   Print then Give to Patient   RxID:   [id number]       Current Allergies: ! LIPITOR (ATORVASTATIN CALCIUM)

## 2010-09-28 NOTE — Assessment & Plan Note (Signed)
Summary: ROA 30 MIN.  CYD   Vital Signs:  Patient profile:   60 year old male Height:      68.5 inches Weight:      295.6 pounds BMI:     44.45 Temp:     98.4 degrees F oral Pulse rate:   76 / minute Pulse rhythm:   regular BP sitting:   140 / 60  (left arm) Cuff size:   large  Vitals Entered By: Benny Lennert CMA Duncan Dull) (July 03, 2009 8:38 AM)  History of Present Illness: Chief complaint cpx  Recently seen by GI for right upper quadrant abdominal pain...EGD showed gasstritis...thought to be due to medicaitons (NSAID and meloxicam). H pylori negative. Increased nexium to two times a day Has been feeling some better unitl it all returned yesterday.  He states he sometimes has numbness under right arm. He feels pain in right back and right lower chest over rib cage.  He states no pain with bending forward or moving.  The patient is here for annual wellness exam and preventative care.    He also has the following poorly controlled chronic issues.   DM, very poorly controlled on insulin 70/30, but better than at last check....cannot afford other.  FBS in last few weeks 80-120,  not checking post prandial.  Never sees less than 60.  Taking 70 Units BID  in last month.  Still not exercising.Marland Kitchenlaying in bed all day.  "i don't care"  HTN, slightly above goal , 130/80. on lisinopril 10 mg daily.   High cholesterol: LDL 100 points above goal.  HAs been intolerant of statins recently.  Depression, somewhat improved...still very unmotivated.   Problems Prior to Update: 1)  Chest Pain  (ICD-786.50) 2)  Early Satiety  (ICD-780.94) 3)  Abdominal Pain Right Upper Quadrant  (ICD-789.01) 4)  Abdominal Pain, Right Upper Quadrant  (ICD-789.01) 5)  Achilles Tendinitis, Bilateral  (ICD-726.71) 6)  Abdominal Pain, Right Upper Quadrant  (ICD-789.01) 7)  Healthy Adult Male  (ICD-V70.0) 8)  Special Screening For Malignant Neoplasms Colon  (ICD-V76.51) 9)  Special Screening Malignant  Neoplasm of Prostate  (ICD-V76.44) 10)  Depressive Disorder  (ICD-311) 11)  Erectile Dysfunction  (ICD-302.72) 12)  Obesity  (ICD-278.00) 13)  Family History Diabetes 1st Degree Relative  (ICD-V18.0) 14)  Family History of Cad Male 1st Degree Relative <50  (ICD-V17.3) 15)  Osteoarthritis  (ICD-715.90) 16)  Hypertension  (ICD-401.9) 17)  Hyperlipidemia  (ICD-272.4) 18)  Diabetes Mellitus, Type II  (ICD-250.00) 19)  Coronary Artery Disease  (ICD-414.00) 20)  Hx of Renal Calculus, Hx of  (ICD-V13.01)  Current Medications (verified): 1)  Humulin 70/30 70-30 % Susp (Insulin Isophane & Regular) .... 70 Units Before Breakfast and 70 Units Before Supper 2)  Fluoxetine Hcl 40 Mg Caps (Fluoxetine Hcl) .Marland Kitchen.. 1 Tab By Mouth Daily 3)  Nexium 40 Mg Cpdr (Esomeprazole Magnesium) .... One Tablet By Mouth Twice Daily 30 Min Prior To Breakfast and Dinner 4)  Lisinopril 20 Mg Tabs (Lisinopril) .... Take 1 Tablet By Mouth Once A Day 5)  Mens Multivitamin Plus  Tabs (Multiple Vitamins-Minerals) .... Once Daily 6)  Fenofibrate 160 Mg Tabs (Fenofibrate) .Marland Kitchen.. 1 Tab By Mouth Daily 7)  Hydrocodone-Acetaminophen 10-325 Mg/84ml Soln (Hydrocodone-Acetaminophen) .Marland Kitchen.. 1 Tab By Mouth Q6 Hour As Needed Severe Pain 8)  Hydrocodone-Acetaminophen 10-325 Mg Tabs (Hydrocodone-Acetaminophen) .Marland Kitchen.. 1 Tab By Mouth Q6 Hours As Needed Pain  Allergies: 1)  ! Lipitor (Atorvastatin Calcium)  Past History:  Past medical, surgical,  family and social histories (including risk factors) reviewed, and no changes noted (except as noted below).  Past Medical History: Reviewed history from 06/19/2009 and no changes required. Coronary artery disease/MILD. Diabetes mellitus, type II/INSULIN DEPENDENT Hyperlipidemia Hypertension Osteoarthritis Depression Kidney Stones Obesity  Past Surgical History: Reviewed history from 07/06/2007 and no changes required. 09/2000: Cardiac Cath  diffuse LAD, 30 % LCA  EF 50-65% 02/2002 back injury:  workers comp 11/08 cath:no significant CAD  Family History: Reviewed history from 06/19/2009 and no changes required. father died age 8 Dm, MI mother age 4 Alzheimer's, emphesema Family History of CAD Male 1st degree relative <50 Family History Diabetes 1st degree relative brother ? neck cancer No FH of Colon Cancer:  Social History: Reviewed history from 03/26/2007 and no changes required. disabled because of back injury finacial concerns wife with depression daughter died 68 months ago Married Never Smoked Alcohol use-no Drug use-no Regular exercise-yes  Review of Systems General:  Denies fatigue and fever. GU:  Denies dysuria. Derm:  Denies rash. Psych:  Complains of depression; denies suicidal thoughts/plans. Endo:  Denies excessive hunger, excessive thirst, and polyuria.  Physical Exam  General:  alert, well-developed, well-nourished, and well-hydrated.  morbidly obese   Mouth:  MMM Neck:  no carotid bruit or thyromegaly no cervical or supraclavicular lymphadenopathy  Chest Wall:  ttp right central and lower Lungs:  Normal respiratory effort, chest expands symmetrically. Lungs are clear to auscultation, no crackles or wheezes. Heart:  Normal rate and regular rhythm. S1 and S2 normal without gallop, murmur, click, rub or other extra sounds. Abdomen:  Ttp over right upper quadrant and partially over epigastrum, no rebound, no guarding, no masses.no hepatomegaly and no splenomegaly.   Pulses:  R and L posterior tibial pulses are full and equal bilaterally  Extremities:  no edema  Diabetes Management Exam:    Foot Exam (with socks and/or shoes not present):       Sensory-Pinprick/Light touch:          Left medial foot (L-4): normal          Left dorsal foot (L-5): normal          Left lateral foot (S-1): normal          Right medial foot (L-4): normal          Right dorsal foot (L-5): normal          Right lateral foot (S-1): normal       Sensory-Monofilament:           Left foot: normal          Right foot: normal       Inspection:          Left foot: normal          Right foot: normal       Nails:          Left foot: normal          Right foot: normal   Impression & Recommendations:  Problem # 1:  ABDOMINAL PAIN RIGHT UPPER QUADRANT (ICD-789.01) ? due tpo GERD?choronic gastritis seen on ENDO. Pt still with episode of pain despite BID Nexium. May take loneger on this med and diet change to have more improvement. Continue Nexium two times a day, samples given and request sent to [facility] for drug assistance through pharmacutical program.  NO other clear etiololgy other than possible MSK pain.  Stay off NSAIDs given gastritis.   Problem # 2:  CHEST  PAIN (ICD-786.50) see above.  Problem # 3:  DEPRESSIVE DISORDER (ICD-311) ? lack of motivation caused by poor control of depression. Increase fluoxetine.  His updated medication list for this problem includes:    Fluoxetine Hcl 40 Mg Caps (Fluoxetine hcl) .Marland Kitchen... 1 tab by mouth daily  Problem # 4:  HYPERTENSION (ICD-401.9)  Poor control... increase lisinopril to max. Follow up in 2 weeks for Creatinine and BP check.  His updated medication list for this problem includes:    Lisinopril 20 Mg Tabs (Lisinopril) .Marland Kitchen... Take 1 tablet by mouth once a day  BP today: 140/60 Prior BP: 142/84 (06/19/2009)  Prior 10 Yr Risk Heart Disease: N/A (04/10/2007)  Labs Reviewed: K+: 4.6 (06/26/2009) Creat: : 1.0 (06/26/2009)   Chol: 252 (06/26/2009)   HDL: 35.50 (06/26/2009)   LDL: 142 (07/09/2008)   TG: 152.0 (06/26/2009)  Problem # 5:  DIABETES MELLITUS, TYPE II (ICD-250.00) Poor control..very noncompliant .Marland Kitchen.? due to depression and pain. Continue current dose insulin. Readdress at folow up in 2 weeks.  His updated medication list for this problem includes:    Humulin 70/30 70-30 % Susp (Insulin isophane & regular) .Marland KitchenMarland KitchenMarland KitchenMarland Kitchen 70 units before breakfast and 70 units before supper    Lisinopril 20 Mg Tabs (Lisinopril)  .Marland Kitchen... Take 1 tablet by mouth once a day  Problem # 6:  HYPERLIPIDEMIA (ICD-272.4) Far from goal. Intolerant of statins in past. Cannot afford welchol. Will add fenofibrate and recheck in 3 months. NEEDS TO MAKE DIET CHANGES!!! His updated medication list for this problem includes:    Fenofibrate 160 Mg Tabs (Fenofibrate) .Marland Kitchen... 1 tab by mouth daily  Labs Reviewed: SGOT: 15 (06/26/2009)   SGPT: 17 (06/26/2009)  Lipid Goals: Chol Goal: 200 (10/09/2007)   HDL Goal: 40 (10/09/2007)   LDL Goal: 70 (10/09/2007)   TG Goal: 150 (10/09/2007)  Prior 10 Yr Risk Heart Disease: N/A (04/10/2007)   HDL:35.50 (06/26/2009), 33.10 (01/12/2009)  LDL:142 (07/09/2008), 120 (04/09/2008)  Chol:252 (06/26/2009), 228 (01/12/2009)  Trig:152.0 (06/26/2009), 103.0 (01/12/2009)  Complete Medication List: 1)  Humulin 70/30 70-30 % Susp (Insulin isophane & regular) .... 70 units before breakfast and 70 units before supper 2)  Fluoxetine Hcl 40 Mg Caps (Fluoxetine hcl) .Marland Kitchen.. 1 tab by mouth daily 3)  Nexium 40 Mg Cpdr (Esomeprazole magnesium) .... One tablet by mouth twice daily 30 min prior to breakfast and dinner 4)  Lisinopril 20 Mg Tabs (Lisinopril) .... Take 1 tablet by mouth once a day 5)  Mens Multivitamin Plus Tabs (Multiple vitamins-minerals) .... Once daily 6)  Fenofibrate 160 Mg Tabs (Fenofibrate) .Marland Kitchen.. 1 tab by mouth daily 7)  Hydrocodone-acetaminophen 10-325 Mg/70ml Soln (Hydrocodone-acetaminophen) .Marland Kitchen.. 1 tab by mouth q6 hour as needed severe pain 8)  Hydrocodone-acetaminophen 10-325 Mg Tabs (Hydrocodone-acetaminophen) .Marland Kitchen.. 1 tab by mouth q6 hours as needed pain  Other Orders: Gastroenterology Referral (GI)  Patient Instructions: 1)  Increase fluoxetine to 40 tabs po  mg daily.  2)  Increase lisinopril to 2 tabs daily.Marland KitchenMarland KitchenReturn for BMET 7-10 days after increase Dx 401.1 3)  Start cholesterol medicaiton fenofibrate. 4)  Try pain medication, for severe pain only...do not take and drive.  5)  Continue at  higher dose of insulin if possible 70 Units daily...try to eat every 5 hours healthy foods. 6)  Referral Appointment Information 7)  Day/Date: 8)  Time: 9)  Place/MD: 10)  Address: 11)  Phone/Fax: 12)  Patient given appointment information. Information/Orders faxed/mailed.  13)  Follow up in 2 weeks for pain in right chest,  abdomen.  14)  Follow up appt in 3 months DM.  15)  BMP prior to visit, ICD-9:  16)  Hepatic Panel prior to visit ICD-9:  17)  Lipid panel prior to visit ICD-9 :  18)  PSA prior to visit ICD-9:  19)  HgBA1c prior to visit  ICD-9:  Prescriptions: HYDROCODONE-ACETAMINOPHEN 10-325 MG TABS (HYDROCODONE-ACETAMINOPHEN) 1 tab by mouth q6 hours as needed pain  #20 x 0   Entered and Authorized by:   Kerby Nora MD   Signed by:   Kerby Nora MD on 07/03/2009   Method used:   Print then Give to Patient   RxID:   [id number] HYDROCODONE-ACETAMINOPHEN 10-325 MG/15ML SOLN (HYDROCODONE-ACETAMINOPHEN) 1 tab by mouth q6 hour as needed severe pain  #20 x 0   Entered and Authorized by:   Kerby Nora MD   Signed by:   Kerby Nora MD on 07/03/2009   Method used:   Print then Give to Patient   RxID:   [id number] FENOFIBRATE 160 MG TABS (FENOFIBRATE) 1 tab by mouth daily  #30 x 11   Entered and Authorized by:   Kerby Nora MD   Signed by:   Kerby Nora MD on 07/03/2009   Method used:   Electronically to        [facility] [address] (retail)       [address]       [place], [place]  [address]       Ph: [phone]       Fax: [phone]   RxID:   [id number] LISINOPRIL 20 MG TABS (LISINOPRIL) Take 1 tablet by mouth once a day  #30 x 11   Entered and Authorized by:   Kerby Nora MD   Signed by:   Kerby Nora MD on 07/03/2009   Method used:   Electronically to        [facility] [address] (retail)       [address]       [place], [place]  [address]       Ph: [phone]       Fax:  [phone]   RxID:   [id number] FLUOXETINE HCL 40 MG CAPS (FLUOXETINE HCL) 1 tab by mouth daily  #30 x 11   Entered and Authorized by:   Kerby Nora MD   Signed by:   Kerby Nora MD on 07/03/2009   Method used:   Electronically to        [facility] [address] (retail)       [address], [address]       [place], [place]  [address]       Ph: [phone]       Fax: [phone]   RxID:   [id number]   Current Allergies (reviewed today): ! LIPITOR (ATORVASTATIN CALCIUM)  Last Flu Vaccine:  given (05/29/2008 10:22:06 PM) Flu Vaccine Result Date:  07/03/2009 Flu Vaccine Result:  given Flu Vaccine Next Due:  1 yr

## 2010-09-28 NOTE — Progress Notes (Signed)
  Phone Note Call from Patient Call back at Home Phone [phone]   Caller: Spouse Call For: Kerby Nora MD Summary of Call: Patient wife called wanting to know if you could write rx for insulin b/c it would be cheaper and he wouldnt have to come by here as much Initial call taken by: Benny Lennert CMA Duncan Dull),  August 07, 2009 3:48 PM  Follow-up for Phone Call        COme by here as much?  I don't understand. Follow-up by: Kerby Nora MD,  August 07, 2009 3:56 PM  Additional Follow-up for Phone Call Additional follow up Details #1::        Patient buys insulin otc at [facility]  Additional Follow-up by: Benny Lennert CMA Duncan Dull),  August 10, 2009 8:10 AM    Additional Follow-up for Phone Call Additional follow up Details #2::    How many vials or boxes does he want at a time? He uses a lot of insulin.  You may send in through EMR the quantity requested with 3 refills.  Follow-up by: Kerby Nora MD,  August 10, 2009 9:52 AM  Prescriptions: HUMULIN 70/30 70-30 % SUSP (INSULIN ISOPHANE & REGULAR) 70 units before breakfast and 70 units before supper  #4 vials x 3   Entered by:   Benny Lennert CMA (AAMA)   Authorized by:   Kerby Nora MD   Signed by:   Benny Lennert CMA (AAMA) on 08/10/2009   Method used:   Electronically to        [facility] [address] (retail)       [address]       [place], [place]  [address]       Ph: [phone]       Fax: [phone]   RxID:   [id number]   Current Allergies (reviewed today): ! LIPITOR (ATORVASTATIN CALCIUM)

## 2010-09-28 NOTE — Progress Notes (Signed)
Summary: prior auth needed for omeprazole  Phone Note Other Incoming   Call placed by: [employer] Call placed to: dr Ermalene Searing Summary of Call: prior auth form for omeprazole is on your desk, they will approve 20 mg two times a day but 40 mg needs review Initial call taken by: Lowella Petties,  March 21, 2008 3:42 PM  Follow-up for Phone Call        Okay have him do 20 mg 2 times a day.  Do we need to send in another Rx? We need to get this med to him before the weekend or he may go back to the ER!  To me that is the same dose  as 40mg  daily  :) Follow-up by: Kerby Nora MD,  March 21, 2008 3:48 PM  Additional Follow-up for Phone Call Additional follow up Details #1::        New Rx. sent to [facility], [employer], [place] by [facility]: Additional Follow-up by: Delilah Shan,  March 21, 2008 5:28 PM        Appended Document: Orders Update    Clinical Lists Changes  Medications: Changed medication from OMEPRAZOLE 40 MG  CPDR (OMEPRAZOLE) 1 tab by mouth daily to OMEPRAZOLE 20 MG  CPDR (OMEPRAZOLE) 1 tab by mouth two times a day - Signed Rx of OMEPRAZOLE 20 MG  CPDR (OMEPRAZOLE) 1 tab by mouth two times a day;  #60 x 3;  Signed;  Entered by: Kerby Nora MD;  Authorized by: Kerby Nora MD;  Method used: Electronic    Prescriptions: OMEPRAZOLE 20 MG  CPDR (OMEPRAZOLE) 1 tab by mouth two times a day  #60 x 3   Entered and Authorized by:   Kerby Nora MD   Signed by:   Kerby Nora MD on 03/21/2008   Method used:   Electronically sent to ...       [facility] [address]       [address]       [place], [place]  [address]       Ph: [phone]       Fax: [phone]   RxID:   [id number]

## 2010-09-28 NOTE — Miscellaneous (Signed)
Summary: PT Initial Eval/[facility]  PT Initial Eval/[facility]   Imported By: Lanelle Bal 12/30/2009 13:17:12  _____________________________________________________________________  External Attachment:    Type:   Image     Comment:   External Document

## 2010-09-28 NOTE — Medication Information (Signed)
Summary: [facility] Diabetic Supplies Order  [facility] Order   Imported By: Beau Fanny 11/05/2008 09:06:55  _____________________________________________________________________  External Attachment:    Type:   Image     Comment:   External Document

## 2010-09-28 NOTE — Letter (Signed)
Summary: EGD Instructions  [facility]  [address] [place], [place] [address]   Phone: [phone]  Fax: [phone]       [name]    03/16/1951    MRN: [id number]       Procedure Day /Date: 06-25-09     Arrival Time:  9:30 AM     Procedure Time: 10:30 AM     Location of Procedure:                        [facility] (4th Floor) _  _ [facility] ( Outpatient Registration) _  _ [facility] (Short Stay on [address]   PREPARATION FOR ENDOSCOPY   On 06-25-09 THE DAY OF THE PROCEDURE:  1.   No solid foods, milk or milk products are allowed after midnight the night before your procedure.  2.   Do not drink anything colored red or purple.  Avoid juices with pulp.  No orange juice.  3.  You may drink clear liquids until 8:30 AM which is 2 hours before your procedure.                                                                                                CLEAR LIQUIDS INCLUDE: Water Jello Ice Popsicles Tea (sugar ok, no milk/cream) Powdered fruit flavored drinks Coffee (sugar ok, no milk/cream) Gatorade Juice: apple, white grape, white cranberry  Lemonade Clear bullion, consomm, broth Carbonated beverages (any kind) Strained chicken noodle soup Hard Candy   MEDICATION INSTRUCTIONS  Unless otherwise instructed, you should take regular prescription medications with a small sip of water as early as possible the morning of your procedure.  Diabetic patients - see separate instructions.             OTHER INSTRUCTIONS  You will need a responsible adult at least 61 years of age to accompany you and drive you home.   This person must remain in the waiting room during your procedure.  Wear loose fitting clothing that is easily removed.  Leave jewelry and other valuables at home.  However, you may wish to bring a book to read or an iPod/MP3 player to listen to music as you wait for your procedure to  start.  Remove all body piercing jewelry and leave at home.  Total time from sign-in until discharge is approximately 2-3 hours.  You should go home directly after your procedure and rest.  You can resume normal activities the day after your procedure.  The day of your procedure you should not:   Drive   Make legal decisions   Operate machinery   Drink alcohol   Return to work  You will receive specific instructions about eating, activities and medications before you leave.    The above instructions have been reviewed and explained to me by   _______________________    I fully understand and can verbalize these instructions _____________________________ Date _________

## 2010-09-28 NOTE — Assessment & Plan Note (Signed)
Summary: CPX/RBH   Vital Signs:  Patient Profile:   60 Years Old Male Height:     68.5 inches Weight:      277.13 pounds Temp:     97.6 degrees F oral Pulse rate:   88 / minute Pulse rhythm:   regular BP sitting:   132 / 74  (left arm) Cuff size:   large  Vitals Entered By: Delilah Shan (July 14, 2008 11:44 AM)                 Chief Complaint:  CPX.  History of Present Illness: Feels like pravastatin at higher dose has caused some muscle ache  Diabetes Management History:      He is (or has been) enrolled in the "Diabetic Education Program".  He is not checking home blood sugars.  He says that he is not exercising regularly.        Hypoglycemic symptoms are not occurring.  No hyperglycemic symptoms are reported.        Other questions/concerns include: eats late at night.  No changes have been made to his treatment plan since last visit.        His home fasting blood sugars are as follows: highest: 220; lowest: 190.    Hypertension History:      improved control.        Positive major cardiovascular risk factors include male age 57 years old or older, diabetes, hyperlipidemia, and hypertension.  Negative major cardiovascular risk factors include non-tobacco-user status.        Positive history for target organ damage include ASHD (either angina; prior MI; prior CABG).    Lipid Management History:      Positive NCEP/ATP III risk factors include male age 12 years old or older, diabetes, HDL cholesterol less than 40, hypertension, and ASHD (atherosclerotic heart disease).  Negative NCEP/ATP III risk factors include non-tobacco-user status.         Current Allergies (reviewed today): ! LIPITOR (ATORVASTATIN CALCIUM)  Past Medical History:    Reviewed history from 12/05/2006 and no changes required:       Coronary artery disease       Diabetes mellitus, type II       Hyperlipidemia       Hypertension       Osteoarthritis   Social History:    Reviewed history  from 03/26/2007 and no changes required:       disabled because of back injury       finacial concerns       wife with depression       daughter died 9 months ago       Married       Never Smoked       Alcohol use-no       Drug use-no       Regular exercise-yes           Review of Systems  General      Denies fatigue.  CV      Denies chest pain or discomfort.  Resp      Denies shortness of breath.  GI      Denies abdominal pain.   Physical Exam  General:     alert, well-developed, well-nourished, and well-hydrated.  morbidly obese   Eyes:     No corneal or conjunctival inflammation noted. EOMI. Perrla. Funduscopic exam benign, without hemorrhages, exudates or papilledema. Vision grossly normal. Ears:     TMs retracted with some fluid Nose:  no airflow obstruction, mucosal erythema, and mucosal edema.   Mouth:     no exudates and pharyngeal erythema.   Neck:     no carotid bruit or thyromegaly  Lungs:     Normal respiratory effort, chest expands symmetrically. Lungs are clear to auscultation, no crackles or wheezes. Heart:     Normal rate and regular rhythm. S1 and S2 normal without gallop, murmur, click, rub or other extra sounds. Abdomen:     Bowel sounds positive,abdomen soft and non-tender without masses, organomegaly or hernias noted. Rectal:     No external abnormalities noted. Normal sphincter tone. No rectal masses or tenderness. Prostate:     Prostate gland firm and smooth, no enlargement, nodularity, tenderness, mass, asymmetry or induration. Pulses:     R and L posterior tibial pulses are full and equal bilaterally  Extremities:     no edema Neurologic:     alert & oriented X3 and gait normal.    Diabetes Management Exam:    Foot Exam (with socks and/or shoes not present):       Sensory-Pinprick/Light touch:          Left medial foot (L-4): normal          Left dorsal foot (L-5): normal          Left lateral foot (S-1): normal           Right medial foot (L-4): normal          Right dorsal foot (L-5): normal          Right lateral foot (S-1): normal       Sensory-Monofilament:          Left foot: normal          Right foot: normal       Inspection:          Left foot: normal          Right foot: normal       Nails:          Left foot: normal          Right foot: normal    Impression & Recommendations:  Problem # 1:  Preventive Health Care (ICD-V70.0) Reviewed preventive care protocols, scheduled due services, and updated immunizations. Encouraged exercise, weight loss, healthy eating habits.   Problem # 2:  HYPERTENSION (ICD-401.9) well controlled on current medication.  His updated medication list for this problem includes:    Enalapril Maleate 20 Mg Tabs (Enalapril maleate) .Marland Kitchen... 2 tab by mouth daily    Coreg 6.25 Mg Tabs (Carvedilol) .Marland Kitchen... Take 1 tablet by mouth two times a day    Amlodipine Besylate 5 Mg Tabs (Amlodipine besylate) .Marland Kitchen... Take 1 tablet by mouth once a day  BP today: 132/74 Prior BP: 142/76 (04/14/2008)  Prior 10 Yr Risk Heart Disease: N/A (04/10/2007)  Labs Reviewed: Creat: 0.9 (07/09/2008) Chol: 192 (07/09/2008)   HDL: 32.3 (07/09/2008)   LDL: 142 (07/09/2008)   TG: 88 (07/09/2008)   Problem # 3:  HYPERLIPIDEMIA (ICD-272.4) Not at goal, not tolerating higher dose of pravastatin. WIll decrease back to 40 mg dose and add Zetia.  Recheck in 3 months.  His updated medication list for this problem includes:    Pravastatin Sodium 40 Mg Tabs (Pravastatin sodium) .Marland Kitchen... Take 1 tablet by mouth once a day    Zetia 10 Mg Tabs (Ezetimibe) .Marland Kitchen... Take 1 tablet by mouth once a day  Labs Reviewed: Chol: 192 (07/09/2008)  HDL: 32.3 (07/09/2008)   LDL: 142 (07/09/2008)   TG: 88 (07/09/2008) SGOT: 13 (07/09/2008)   SGPT: 17 (07/09/2008)  Lipid Goals: Chol Goal: 200 (10/09/2007)   HDL Goal: 40 (10/09/2007)   LDL Goal: 70 (10/09/2007)   TG Goal: 150 (10/09/2007)  Prior 10 Yr Risk Heart Disease: N/A  (04/10/2007)   Problem # 4:  DIABETES MELLITUS, TYPE II (ICD-250.00) Very poor control. Poor compliance with diet and exercise. Increase insulin to 50 UNits two times a day (cannot change type of insulin due to cost) His updated medication list for this problem includes:    Enalapril Maleate 20 Mg Tabs (Enalapril maleate) .Marland Kitchen... 2 tab by mouth daily    Humulin 70/30 70-30 % Susp (Insulin isophane & regular) .Marland KitchenMarland KitchenMarland KitchenMarland Kitchen 50 units injected twice daily  Labs Reviewed: HgBA1c: 11.9 (07/09/2008)   Creat: 0.9 (07/09/2008)   Microalbumin: 0.5 (07/09/2008)   Complete Medication List: 1)  Advil 200 Mg Caps (Ibuprofen) .... As needed 2)  Mens Multivitamin Plus Tabs (Multiple vitamins-minerals) .... Once daily 3)  Enalapril Maleate 20 Mg Tabs (Enalapril maleate) .... 2 tab by mouth daily 4)  Coreg 6.25 Mg Tabs (Carvedilol) .... Take 1 tablet by mouth two times a day 5)  Humulin 70/30 70-30 % Susp (Insulin isophane & regular) .... 50 units injected twice daily 6)  Amlodipine Besylate 5 Mg Tabs (Amlodipine besylate) .... Take 1 tablet by mouth once a day 7)  Ventolin Hfa 108 (90 Base) Mcg/act Aers (Albuterol sulfate) .Marland Kitchen.. 1-2 puffs q 4h as needed chest tightness of wheezing 8)  Pravastatin Sodium 40 Mg Tabs (Pravastatin sodium) .... Take 1 tablet by mouth once a day 9)  Protonix 40 Mg Pack (Pantoprazole sodium) .Marland Kitchen.. 1 tab by mouth daily 10)  Zetia 10 Mg Tabs (Ezetimibe) .... Take 1 tablet by mouth once a day  Other Orders: Gastroenterology Referral (GI)  Diabetes Management Assessment/Plan:      The following lipid goals have been established for the patient: Total cholesterol goal of 200; LDL cholesterol goal of 70; HDL cholesterol goal of 40; Triglyceride goal of 150.  His blood pressure goal is < 130/80.    Current Insulin Use:    Longer Acting Insulin:       Type:  Humulin 70/30       Dosage:  15 units at breakfast; 15 units at bedtime.  Hypertension Assessment/Plan:      [name]  hypertensive risk group is category C: Target organ damage and/or diabetes.  Today's blood pressure is 132/74.  His blood pressure goal is < 130/80.  Lipid Assessment/Plan:      Based on NCEP/ATP III, the patient's risk factor category is "history of coronary disease, peripheral vascular disease, cerebrovascular disease, or aortic aneurysm along with either diabetes, current smoker, or LDL > 130 plus HDL < 40 plus triglycerides > 200".  From this information, the patient's calculated lipid goals are as follows: Total cholesterol goal is 200; LDL cholesterol goal is 70; HDL cholesterol goal is 40; Triglyceride goal is 150.  His LDL cholesterol goal has not been met.     Patient Instructions: 1)  Decrease back down to 1 tablet of pravastatin daily. 2)  Add new medicaine Zetia.   3)  Increase insulin to 50 UNits twice daily. 4)  Call in  aweek if morning blood sugar are still above 120.   5)  Please schedule a follow-up appointment in 3 months 30 min appt. Marland Kitchen 6)  BMP prior to visit, ICD-9:250.00 ALL  7)  Hepatic Panel prior to visit, ICD-9: 8)  Lipid Panel prior to visit, ICD-9: 9)  HbgA1C prior to visit, ICD-9: 10)  Referral Appointment Information 11)  Day/Date: 12)  Time: 13)  Place/MD: 14)  Address: 15)  Phone/Fax: 16)  Patient given appointment information. Information/Orders faxed/mailed.    Prescriptions: PROTONIX 40 MG  PACK (PANTOPRAZOLE SODIUM) 1 tab by mouth daily  #30 x 11   Entered and Authorized by:   Kerby Nora MD   Signed by:   Kerby Nora MD on 07/14/2008   Method used:   Electronically to        [facility] [address] (retail)       [address], [address]       [place], [place]  [address]       Ph: [phone]       Fax: [phone]   RxID:   [id number] ENALAPRIL MALEATE 20 MG  TABS (ENALAPRIL MALEATE) 2 tab by mouth daily  #60 x 5   Entered and Authorized by:   Kerby Nora MD   Signed by:   Kerby Nora MD on 07/14/2008   Method  used:   Electronically to        [facility] [address] (retail)       [name]       [place], [place]  [address]       Ph: [phone]       Fax: [phone]   RxID:   [id number] ZETIA 10 MG TABS (EZETIMIBE) Take 1 tablet by mouth once a day  #30 x 5   Entered and Authorized by:   Kerby Nora MD   Signed by:   Kerby Nora MD on 07/14/2008   Method used:   Electronically to        [facility] [address] (retail)       [address], [address]       [place], [place]  [address]       Ph: [phone]       Fax: [phone]   RxID:   [id number]  ] Current Allergies (reviewed today): ! LIPITOR (ATORVASTATIN CALCIUM) Current Medications (including changes made in today's visit):  ADVIL 200 MG  CAPS (IBUPROFEN) as needed MENS MULTIVITAMIN PLUS   TABS (MULTIPLE VITAMINS-MINERALS) once daily ENALAPRIL MALEATE 20 MG  TABS (ENALAPRIL MALEATE) 2 tab by mouth daily COREG 6.25 MG  TABS (CARVEDILOL) Take 1 tablet by mouth two times a day HUMULIN 70/30 70-30 %  SUSP (INSULIN ISOPHANE & REGULAR) 50 Units injected twice daily AMLODIPINE BESYLATE 5 MG  TABS (AMLODIPINE BESYLATE) Take 1 tablet by mouth once a day VENTOLIN HFA 108 (90 BASE) MCG/ACT  AERS (ALBUTEROL SULFATE) 1-2 puffs q 4h as needed chest tightness of wheezing PRAVASTATIN SODIUM 40 MG  TABS (PRAVASTATIN SODIUM) Take 1 tablet by mouth once a day PROTONIX 40 MG  PACK (PANTOPRAZOLE SODIUM) 1 tab by mouth daily ZETIA 10 MG TABS (EZETIMIBE) Take 1 tablet by mouth once a day  Last Flu Vaccine:  Fluvax 3+ (06/30/2007 9:44:19 AM) Flu Vaccine Result Date:  05/29/2008 Flu Vaccine Result:  given Flu Vaccine Next Due:  1 yr TD Result Date:  06/30/2007 TD Result:  given TD Next Due:  10 yr Flex Sig Next Due:  Not Indicated Hemoccult Next  Due:  Not Indicated

## 2010-09-28 NOTE — Assessment & Plan Note (Signed)
Summary: ROA   Vital Signs:  Patient profile:   60 year old male Weight:      303 pounds BMI:     45.56 Temp:     98.1 degrees F oral Pulse rate:   84 / minute Pulse rhythm:   regular BP sitting:   122 / 52  (left arm) Cuff size:   large  Vitals Entered By: Linde Gillis CMA Duncan Dull) (October 06, 2009 8:15 AM) CC: return office visit   History of Present Illness: ER visit in 08/2009 for SOB..he states occurs intermittantly..under a lot of stress. Labs and   DM, poor control... Fasting blood sugar rarely 120-200.Marland Kitchenworse when eating late. Using 70/30 &0 Untis two times a day. Eats a lot of bread. Not interested in trying to get back on metformin. Does not want to take more shots. Continues to gain weight.   Not exercising much.  B leg pain... calves.  Improves if lies down and rests. Legs swelling intermittantly. Joints stiff after sitting in car for a while. Occ numbness in right sole of foot. No curent low back pain, no incontinence.    Problems Prior to Update: 1)  Constipation  (ICD-564.00) 2)  Gastritis  (ICD-535.50) 3)  Screening, Colon Cancer  (ICD-V76.51) 4)  Chest Pain  (ICD-786.50) 5)  Early Satiety  (ICD-780.94) 6)  Abdominal Pain Right Upper Quadrant  (ICD-789.01) 7)  Abdominal Pain, Right Upper Quadrant  (ICD-789.01) 8)  Achilles Tendinitis, Bilateral  (ICD-726.71) 9)  Abdominal Pain, Right Upper Quadrant  (ICD-789.01) 10)  Healthy Adult Male  (ICD-V70.0) 67)  Special Screening For Malignant Neoplasms Colon  (ICD-V76.51) 12)  Special Screening Malignant Neoplasm of Prostate  (ICD-V76.44) 13)  Depressive Disorder  (ICD-311) 14)  Erectile Dysfunction  (ICD-302.72) 15)  Obesity  (ICD-278.00) 16)  Family History Diabetes 1st Degree Relative  (ICD-V18.0) 17)  Family History of Cad Male 1st Degree Relative <50  (ICD-V17.3) 18)  Osteoarthritis  (ICD-715.90) 19)  Hypertension  (ICD-401.9) 20)  Hyperlipidemia  (ICD-272.4) 21)  Diabetes Mellitus, Type II   (ICD-250.00) 22)  Coronary Artery Disease  (ICD-414.00) 23)  Hx of Renal Calculus, Hx of  (ICD-V13.01)  Current Medications (verified): 1)  Humulin 70/30 70-30 % Susp (Insulin Isophane & Regular) .... 70 Units Before Breakfast and 70 Units Before Supper 2)  Fluoxetine Hcl 40 Mg Caps (Fluoxetine Hcl) .Marland Kitchen.. 1 Tab By Mouth Daily 3)  Nexium 40 Mg Cpdr (Esomeprazole Magnesium) .... One Tablet By Mouth Twice Daily 30 Min Prior To Breakfast and Dinner 4)  Lisinopril 20 Mg Tabs (Lisinopril) .... Take 1 Tablet By Mouth Once A Day 5)  Mens Multivitamin Plus  Tabs (Multiple Vitamins-Minerals) .... Once Daily 6)  Fenofibrate 160 Mg Tabs (Fenofibrate) .Marland Kitchen.. 1 Tab By Mouth Daily  Allergies: 1)  ! Lipitor (Atorvastatin Calcium)  Past History:  Past medical, surgical, family and social histories (including risk factors) reviewed, and no changes noted (except as noted below).  Past Medical History: Reviewed history from 06/19/2009 and no changes required. Coronary artery disease/MILD. Diabetes mellitus, type II/INSULIN DEPENDENT Hyperlipidemia Hypertension Osteoarthritis Depression Kidney Stones Obesity  Past Surgical History: Reviewed history from 07/29/2009 and no changes required. 09/2000: Cardiac Cath  diffuse LAD, 30 % LCA  EF 50-65% 02/2002 back injury: workers comp 11/08 cath:no significant CAD  Unremarkable  Family History: Reviewed history from 07/29/2009 and no changes required. father died age 47 Dm, MI mother age 3 Alzheimer's, emphesema Family History of CAD Male 1st degree relative <50 Family History  Diabetes 1st degree relative:Father brother ? neck cancer No FH of Colon Cancer:  Social History: Reviewed history from 03/26/2007 and no changes required. disabled because of back injury finacial concerns wife with depression daughter died 52 months ago Married Never Smoked Alcohol use-no Drug use-no Regular exercise-yes  Review of Systems General:  Complains of  fatigue. CV:  Denies chest pain or discomfort. Resp:  Denies shortness of breath. GI:  Denies abdominal pain and bloody stools. GU:  Denies dysuria.  Physical Exam  General:  morbidlesey ob Mouth:  MMM Neck:  no carotid bruit or thyromegaly no cervical or supraclavicular lymphadenopathy  Lungs:  Normal respiratory effort, chest expands symmetrically. Lungs are clear to auscultation, no crackles or wheezes. Heart:  Normal rate and regular rhythm. S1 and S2 normal without gallop, murmur, click, rub or other extra sounds. Pulses:  R and L posterior tibial pulses are diminishedbilaterally  Extremities:  no edema Neurologic:  No cranial nerve deficits noted. Station and gait are normal. Sensory, motor and coordinative functions appear intact.  Diabetes Management Exam:    Foot Exam (with socks and/or shoes not present):       Sensory-Pinprick/Light touch:          Left medial foot (L-4): normal          Left dorsal foot (L-5): normal          Left lateral foot (S-1): normal          Right medial foot (L-4): normal          Right dorsal foot (L-5): normal          Right lateral foot (S-1): normal       Sensory-Monofilament:          Left foot: normal          Right foot: normal       Inspection:          Left foot: normal          Right foot: normal       Nails:          Left foot: normal          Right foot: normal   Impression & Recommendations:  Problem # 1:  LEG PAIN, BILATERAL (ICD-729.5) Eval for claudicatioon with B ABIs. ? arthritis from obesity contributing.  Orders: Radiology Referral (Radiology)  Problem # 2:  HYPERTENSION (ICD-401.9) Well controlled on ciurrent meds.  His updated medication list for this problem includes:    Lisinopril 20 Mg Tabs (Lisinopril) .Marland Kitchen... Take 1 tablet by mouth once a day  BP today: 122/52 Prior BP: 158/72 (08/10/2009)  Prior 10 Yr Risk Heart Disease: N/A (04/10/2007)  Labs Reviewed: K+: 4.3 (07/17/2009) Creat: : 0.9  (07/17/2009)   Chol: 252 (06/26/2009)   HDL: 35.50 (06/26/2009)   LDL: 142 (07/09/2008)   TG: 152.0 (06/26/2009)  Problem # 3:  DIABETES MELLITUS, TYPE II (ICD-250.00) Discussed weight gain.Larry Sierras not open to adding other meds. Cannot afford chaning to long acting insulin.  Increase 70/30 to 72 UNits two times a day...can titrate up if not at goal. Needs to be compliant with exercsie and diet changes.  A1C pending.  His updated medication list for this problem includes:    Humulin 70/30 70-30 % Susp (Insulin isophane & regular) .Marland KitchenMarland KitchenMarland KitchenMarland Kitchen 70 units before breakfast and 70 units before supper    Lisinopril 20 Mg Tabs (Lisinopril) .Marland Kitchen... Take 1 tablet by mouth once a day  Complete Medication  List: 1)  Humulin 70/30 70-30 % Susp (Insulin isophane & regular) .... 70 units before breakfast and 70 units before supper 2)  Fluoxetine Hcl 40 Mg Caps (Fluoxetine hcl) .Marland Kitchen.. 1 tab by mouth daily 3)  Nexium 40 Mg Cpdr (Esomeprazole magnesium) .... One tablet by mouth twice daily 30 min prior to breakfast and dinner 4)  Lisinopril 20 Mg Tabs (Lisinopril) .... Take 1 tablet by mouth once a day 5)  Mens Multivitamin Plus Tabs (Multiple vitamins-minerals) .... Once daily 6)  Fenofibrate 160 Mg Tabs (Fenofibrate) .Marland Kitchen.. 1 tab by mouth daily  Patient Instructions: 1)  Referral Appointment Information 2)  Day/Date: 3)  Time: 4)  Place/MD: 5)  Address: 6)  Phone/Fax: 7)  Patient given appointment information. Information/Orders faxed/mailed. 8)  HgBA1c prior to visit  ICD-9: 250.00 9)  PSA prior to visit ICD-9: v76.44 10)  Please schedule a follow-up appointment in 3 months CPX 11)  Increase inslin to 72 Units two times a day.   Current Allergies (reviewed today): ! LIPITOR (ATORVASTATIN CALCIUM)  Appended Document: ROA Notify pt..no clear blockage in B leg arteries to cause leg pain. Any low back pain?   Appended Document: ROA see other note.Consuello Masse CMA

## 2010-09-28 NOTE — Assessment & Plan Note (Signed)
Summary: cpx/rbh   Vital Signs:  Patient profile:   60 year old male Height:      68.5 inches Weight:      298.8 pounds BMI:     44.93 Temp:     98.1 degrees F oral Pulse rate:   80 / minute Pulse rhythm:   regular BP sitting:   130 / 80  (left arm) Cuff size:   large  Vitals Entered By: Benny Lennert CMA Duncan Dull) (Jan 08, 2010 8:32 AM)  History of Present Illness: Chief complaint cpx  The patient is here for annual wellness exam and preventative care.      Seeing eye MD for vision loss B eyes.  Feels due to HTN. Improved BP control on double dose of lisinopril.  No clear diabetic changes  Seen Dr. Patsy Lager for Achilles tendonitis.  Steriod injections not option..increase risk of tears. Improving on PT.   4 lb weight loss. HAs stopped all sweets, and red meat. Decreased salt in diet.   DM 70 Units 70/30  in AM and PM...  occ low CBGs in evening...taking 70/30 at bedtime .Marland KitchenNOT before supper.   Lipid Management History:      Positive NCEP/ATP III risk factors include male age 70 years old or older, diabetes, HDL cholesterol less than 40, hypertension, and ASHD (either angina/prior MI/prior CABG).  Negative NCEP/ATP III risk factors include non-tobacco-user status.        His compliance with the TLC diet is fair.  The patient expresses understanding of adjunctive measures for cholesterol lowering.  Adjunctive measures started by the patient include aerobic exercise, fiber, limit alcohol consumpton, and weight reduction.  He expresses no side effects from his lipid-lowering medication.  The patient denies any symptoms to suggest myopathy or liver disease.     Problems Prior to Update: 1)  Dermatitis  (ICD-692.9) 2)  Branch Retinal Vein Occlusion  (ICD-362.36) 3)  Leg Pain, Bilateral  (ICD-729.5) 4)  Constipation  (ICD-564.00) 5)  Gastritis  (ICD-535.50) 6)  Screening, Colon Cancer  (ICD-V76.51) 7)  Chest Pain  (ICD-786.50) 8)  Early Satiety  (ICD-780.94) 9)  Abdominal Pain  Right Upper Quadrant  (ICD-789.01) 10)  Abdominal Pain, Right Upper Quadrant  (ICD-789.01) 11)  Achilles Tendinitis, Bilateral  (ICD-726.71) 12)  Abdominal Pain, Right Upper Quadrant  (ICD-789.01) 13)  Healthy Adult Male  (ICD-V70.0) 18)  Special Screening For Malignant Neoplasms Colon  (ICD-V76.51) 15)  Special Screening Malignant Neoplasm of Prostate  (ICD-V76.44) 16)  Depressive Disorder  (ICD-311) 17)  Erectile Dysfunction  (ICD-302.72) 18)  Obesity  (ICD-278.00) 19)  Family History Diabetes 1st Degree Relative  (ICD-V18.0) 20)  Family History of Cad Male 1st Degree Relative <50  (ICD-V17.3) 21)  Osteoarthritis  (ICD-715.90) 22)  Hypertension  (ICD-401.9) 23)  Hyperlipidemia  (ICD-272.4) 24)  Diabetes Mellitus, Type II  (ICD-250.00) 25)  Coronary Artery Disease  (ICD-414.00) 26)  Hx of Renal Calculus, Hx of  (ICD-V13.01)  Current Medications (verified): 1)  Humulin 70/30 70-30 % Susp (Insulin Isophane & Regular) .... 70 Units Before Breakfast and 70 Units Before Supper 2)  Fluoxetine Hcl 40 Mg Caps (Fluoxetine Hcl) .Marland Kitchen.. 1 Tab By Mouth Daily 3)  Nexium 40 Mg Cpdr (Esomeprazole Magnesium) .... One Tablet By Mouth Twice Daily 30 Min Prior To Breakfast and Dinner 4)  Lisinopril 40 Mg Tabs (Lisinopril) .Marland Kitchen.. 1 By Mouth Daily 5)  Mens Multivitamin Plus  Tabs (Multiple Vitamins-Minerals) .... Once Daily 6)  Fenofibrate 160 Mg Tabs (Fenofibrate) .Marland Kitchen.. 1 Tab  By Mouth Daily 7)  Pravastatin Sodium 20 Mg Tabs (Pravastatin Sodium) .... Take 1 Tablet By Mouth Once A Day 8)  Meloxicam 15 Mg Tabs (Meloxicam) .Marland Kitchen.. 1 Tab By Mouth Daily 9)  Nitroglycerin 0.2 Mg/hr Pt24 (Nitroglycerin) .... Apply 1/4 Patch To Each Achilles Tendon Daily (Re: Achilles Tendinopathy) 10)  Triamcinolone Acetonide 0.5 % Crea (Triamcinolone Acetonide) .... Aaa Two Times A Day X 2 Weeks  Allergies: 1)  ! Lipitor (Atorvastatin Calcium)  Past History:  Past medical, surgical, family and social histories (including risk  factors) reviewed, and no changes noted (except as noted below).  Past Medical History: Reviewed history from 06/19/2009 and no changes required. Coronary artery disease/MILD. Diabetes mellitus, type II/INSULIN DEPENDENT Hyperlipidemia Hypertension Osteoarthritis Depression Kidney Stones Obesity  Past Surgical History: Reviewed history from 07/29/2009 and no changes required. 09/2000: Cardiac Cath  diffuse LAD, 30 % LCA  EF 50-65% 02/2002 back injury: workers comp 11/08 cath:no significant CAD  Unremarkable  Family History: Reviewed history from 07/29/2009 and no changes required. father died age 30 Dm, MI mother age 19 Alzheimer's, emphesema Family History of CAD Male 1st degree relative <50 Family History Diabetes 1st degree relative:Father brother ? neck cancer No FH of Colon Cancer:  Social History: Reviewed history from 03/26/2007 and no changes required. disabled because of back injury finacial concerns wife with depression daughter died 92 months ago Married Never Smoked Alcohol use-no Drug use-no Regular exercise-yes  Review of Systems General:  Denies fatigue and fever. CV:  Denies chest pain or discomfort. Resp:  Denies shortness of breath. GI:  Denies abdominal pain. GU:  Denies dysuria. Derm:  dry flaky skin on arms, hands, knees...ithcy.  Physical Exam  General:  morbidly obese  Head:  normocephalic, atraumatic, and no abnormalities observed.   Ears:  External ear exam shows no significant lesions or deformities.  Otoscopic examination reveals clear canals, tympanic membranes are intact bilaterally without bulging, retraction, inflammation or discharge. Hearing is grossly normal bilaterally. Nose:  External nasal examination shows no deformity or inflammation. Nasal mucosa are pink and moist without lesions or exudates. Mouth:  MMM Neck:  no carotid bruit or thyromegaly no cervical or supraclavicular lymphadenopathy  Chest Wall:  No deformities,  masses, tenderness or gynecomastia noted. Lungs:  normal respiratory effort, no intercostal retractions, and no accessory muscle use.   Heart:  Normal rate and regular rhythm. S1 and S2 normal without gallop, murmur, click, rub or other extra sounds. Abdomen:  severe central obesity Rectal:  no external abnormalities, no hemorrhoids, and normal sphincter tone.   Genitalia:  retracted penis due to obesity, circumcised, no hydrocele, no varicocele, no scrotal masses, and no testicular masses or atrophy.   Prostate:  no gland enlargement.   Pulses:  R and L posterior tibial pulses are diminishedbilaterally  Extremities:  trace left pedal edema and trace right pedal edema.   Neurologic:  No cranial nerve deficits noted. Station and gait are normal. Sensory, motor and coordinative functions appear intact. Skin:  dry flaky thickened skin on arms and hands, calluses hands Psych:  Cognition and judgment appear intact. Alert and cooperative with normal attention span and concentration. No apparent delusions, illusions, hallucinations   Impression & Recommendations:  Problem # 1:  HEALTHY ADULT MALE (ICD-V70.0) [name] preventative maintenance and recommended screening tests for an annual wellness exam were reviewed in full today. Brought up to date unless services declined.  Counselled on the importance of diet, exercise, and its role in overall health and mortality.  [name] FH and SH was reviewed, including their home life, tobacco status, and drug and alcohol status.     Problem # 2:  BRANCH RETINAL VEIN OCCLUSION (ICD-362.36) Seeing eye MD...needs better HTN control...now improved on higher dose of BP med.  Will check Cr today to insure stable.   Problem # 3:  HYPERTENSION (ICD-401.9) improved control with 40 mg lisinopril.  Orders: TLB-BMP (Basic Metabolic Panel-BMET) (80048-METABOL)  Problem # 4:  DIABETES MELLITUS, TYPE II (ICD-250.00) Improving control. Discussed insulin  dosing and low CBGs. recheck in 3 months.  His updated medication list for this problem includes:    Humulin 70/30 70-30 % Susp (Insulin isophane & regular) .Marland KitchenMarland KitchenMarland KitchenMarland Kitchen 70 units before breakfast and 70 units before supper    Lisinopril 40 Mg Tabs (Lisinopril) .Marland Kitchen... 1 by mouth daily  Problem # 5:  HYPERLIPIDEMIA (ICD-272.4) Counseled on diet changes...intolerant of pravachol 40 in past.  Recehk in 3 months.  His updated medication list for this problem includes:    Fenofibrate 160 Mg Tabs (Fenofibrate) .Marland Kitchen... 1 tab by mouth daily    Pravastatin Sodium 20 Mg Tabs (Pravastatin sodium) .Marland Kitchen... Take 1 tablet by mouth once a day  Complete Medication List: 1)  Humulin 70/30 70-30 % Susp (Insulin isophane & regular) .... 70 units before breakfast and 70 units before supper 2)  Fluoxetine Hcl 40 Mg Caps (Fluoxetine hcl) .Marland Kitchen.. 1 tab by mouth daily 3)  Nexium 40 Mg Cpdr (Esomeprazole magnesium) .... One tablet by mouth twice daily 30 min prior to breakfast and dinner 4)  Lisinopril 40 Mg Tabs (Lisinopril) .Marland Kitchen.. 1 by mouth daily 5)  Mens Multivitamin Plus Tabs (Multiple vitamins-minerals) .... Once daily 6)  Fenofibrate 160 Mg Tabs (Fenofibrate) .Marland Kitchen.. 1 tab by mouth daily 7)  Pravastatin Sodium 20 Mg Tabs (Pravastatin sodium) .... Take 1 tablet by mouth once a day 8)  Meloxicam 15 Mg Tabs (Meloxicam) .Marland Kitchen.. 1 tab by mouth daily 9)  Nitroglycerin 0.2 Mg/hr Pt24 (Nitroglycerin) .... Apply 1/4 patch to each achilles tendon daily (re: achilles tendinopathy) 10)  Triamcinolone Acetonide 0.5 % Crea (Triamcinolone acetonide) .... Aaa two times a day x 2 weeks  Other Orders: Prescription Created Electronically [phone])  Lipid Assessment/Plan:      Based on NCEP/ATP III, the patient's risk factor category is "history of coronary disease, peripheral vascular disease, cerebrovascular disease, or aortic aneurysm along with either diabetes, current smoker, or LDL > 130 plus HDL < 40 plus triglycerides > 200".  [name]  lipid goals are as follows: Total cholesterol goal is 200; LDL cholesterol goal is 70; HDL cholesterol goal is 40; Triglyceride goal is 150.  His LDL cholesterol goal has not been met.    Patient Instructions: 1)  Take insulin before breakfast and before dinner. Try to hold bedtime carbs. 2)  Increase fiber and lean protein decrease breads, sweets further. 3)   Increase activity as tolerated.  4)  Please schedule a follow-up appointment in 3 months .  5)  BMP prior to visit, ICD-9: 250.00 6)  Hepatic Panel prior to visit ICD-9:  7)  Lipid panel prior to visit ICD-9 :  8)  HgBA1c prior to visit  ICD-9:  9)  Urine Microalbumin prior to visit ICD-9 :  10)  Call insurance to determine if shingle vaccine is covered...let us know if you are interested in shingles vaccine/Zostavax.  Prescriptions: TRIAMCINOLONE ACETONIDE 0.5 % CREA (TRIAMCINOLONE ACETONIDE) AAA two times a day x 2 weeks  #30-60 gm x 0  Entered and Authorized by:   Kerby Nora MD   Signed by:   Kerby Nora MD on 01/08/2010   Method used:   Electronically to        [facility] [address] (retail)       [address]       [place], [place]  [address]       Ph: [phone]       Fax: [phone]   RxID:   [id number]   Current Allergies (reviewed today): ! LIPITOR (ATORVASTATIN CALCIUM)

## 2010-09-28 NOTE — Progress Notes (Signed)
Summary: Fluoxetine  Phone Note Refill Request Message from:  Fax from Pharmacy on December 13, 2007 4:57 PM  Refills Requested: Medication #1:  FLUOXETINE HCL 10 MG  CAPS Take 1 tablet by mouth once a day  Method Requested: [facility] Initial call taken by: Delilah Shan,  December 13, 2007 4:57 PM      Prescriptions: FLUOXETINE HCL 10 MG  CAPS (FLUOXETINE HCL) Take 1 tablet by mouth once a day  #30 x 5   Entered and Authorized by:   Kerby Nora MD   Signed by:   Kerby Nora MD on 12/13/2007   Method used:   Electronically sent to ...       [facility]*       [address]       [place], [place]  [address]       Ph: [phone] or [phone]       Fax: [phone]   RxID:   [id number]

## 2010-09-28 NOTE — Progress Notes (Signed)
Summary: Problems with medication  Phone Note Call from Patient Call back at [phone]   Caller: Patient Call For: Dr. Ermalene Searing Summary of Call: Patient states he is having a lot of problems trying to take the Omeprazole.  It is prescribed two times a day.  He says he has been having palpitations, sweating, achiness, fatigue and disorientation.  He says he didn't take it this a.m. and this is the first time that he has felt like getting out of the house in 4 or 5 days.  He uses [facility] in [place]. Initial call taken by: Delilah Shan,  March 25, 2008 12:09 PM  Follow-up for Phone Call        We will try a different PPI, but it may take a while for his insurance to approve. I will send in Rx For protonix 40 mg daily. Follow-up by: Kerby Nora MD,  March 25, 2008 12:15 PM  Additional Follow-up for Phone Call Additional follow up Details #1::        Patient Advised.  Additional Follow-up by: Delilah Shan,  March 25, 2008 12:23 PM    New/Updated Medications: PROTONIX 40 MG  PACK (PANTOPRAZOLE SODIUM) 1 tab by mouth daily   Prescriptions: PROTONIX 40 MG  PACK (PANTOPRAZOLE SODIUM) 1 tab by mouth daily  #30 x 5   Entered and Authorized by:   Kerby Nora MD   Signed by:   Kerby Nora MD on 03/25/2008   Method used:   Electronically sent to ...       [facility] [address]       [address]       [place], [place]  [address]       Ph: [phone]       Fax: [phone]   RxID:   [id number] PROTONIX 40 MG  PACK (PANTOPRAZOLE SODIUM) 1 tab by mouth daily  #30 x 5   Entered and Authorized by:   Kerby Nora MD   Signed by:   Kerby Nora MD on 03/25/2008   Method used:   Electronically sent to ...       [facility]*       [address]       [place], [place]  [address]       Ph: [phone] or [phone]       Fax: [phone]   RxID:   [id number]

## 2010-09-28 NOTE — Assessment & Plan Note (Signed)
Vital Signs:  Patient Profile:   60 Years Old Male Height:     68.5 inches Weight:      281 pounds BMI:     42.26 Temp:     98 degrees F Pulse rate:   92 / minute BP supine:   162 / 78 Cuff size:   large               Chief Complaint:  Type 2 diabetes mellitus follow-up.  History of Present Illness:  Type 2 Diabetes Mellitus Follow-Up      This is a 60 year old man who presents for Type 2 diabetes mellitus follow-up.  Started Avandamet 500/2 mg by mouth two times a day 2 weeks ago. To  expensive , wants cheaper med .  Some loose stool, not bad.   He has been trying to watch eating habits and walks occasionally....6 lb weight loss in last few weeks.  FBS: running approximately 280 .  The patient reports chronic numbness of extremities, but denies self managed hypoglycemia and hypoglycemia requiring help.  The patient denies the following symptoms: chest pain.      Hypertension Follow-Up      The patient also presents for Hypertension follow-up.  Started ramipril 2.5mg  2 weeks ago.  Expensive for him.  The patient reports lightheadedness and urinary frequency.   One episode of "woozyheadedness" yesterday lasted 2 hours.   Was having occ symptoms like that prior to starting medicines.  Did not measure BP or CBG at the time.  He does have continued fatigue.      Diabetes Management History:      He states understanding of dietary principles and is following his diet appropriately.  Sensory loss is noted.  Self foot exams are being performed.  He is checking home blood sugars.  He says that he is exercising.        Hypoglycemic symptoms are not occurring.  Hyperglycemic symptoms include polyuria and blurred vision.        Symptoms which suggest diabetic complications include lightheadedness/orthostatic.  The following changes have been made to his treatment plan since last visit: medication changes.       Past Medical History:    Coronary artery disease    Diabetes mellitus, type  II    Hyperlipidemia    Hypertension    Osteoarthritis  Past Surgical History:    09/2000: Cardiac Cath  diffuse LAD, 30 % LCA  EF 50-65%    02/2002 back injury: workers comp   Family History:    father died age 50 Dm, MI    mother age 41 Alzheimer's, emphesema    Family History of CAD Male 1st degree relative <50    Family History Diabetes 1st degree relative    brother ? neck cancer  Social History:    disabled because of back injury    finacial concerns    wife with depression    daughter died 9 months ago    Married    Never Smoked    Alcohol use-no    Drug use-no    Regular exercise-yes   Risk Factors:  Tobacco use:  never Drug use:  no Alcohol use:  no Exercise:  yes   Review of Systems       see HPI   Physical Exam  General:     Well-developed,well-nourished,in no acute distress; alert,appropriate and cooperative throughout examination Chest Wall:     No deformities, masses, tenderness or gynecomastia noted.  Lungs:     Normal respiratory effort, chest expands symmetrically. Lungs are clear to auscultation, no crackles or wheezes. Heart:     Normal rate and regular rhythm. S1 and S2 normal without gallop, murmur, click, rub or other extra sounds. Pulses:     R and L carotid,radial,femoral,dorsalis pedis and posterior tibial pulses are full and equal bilaterally Extremities:     No clubbing, cyanosis, edema, or deformity noted with normal full range of motion of all joints.   Psych:     Resigned attitude,stated "no one cares about you if you have no money" Oriented X3, memory intact for recent and remote, normally interactive, good eye contact, not anxious appearing, and not agitated.      Impression & Recommendations:  Problem # 1:  DIABETES MELLITUS, TYPE II (ICD-250.00) Assessment: Improved Change from Avandamet to metformin and glipizide for affordability.  Increase metformin to 1000 mg two times a day, glipizide at 5 mg daily.   Continue to  follow CBGs.  Call if greater than 120.  Will likely need further adjustment.  Return for HgA1C in 2 months.  Contiue exercise, lifestyle changes.  Problem # 2:  HYPERTENSION (ICD-401.9) Assessment: Unchanged Change from ramipril to generic enalapril.  Increase dosage to 10 mg daily. (While using up ramipril take 2 tab daily for 5 mg toatl)  Return in 7 days for BMET to eval for any renal fx change.  Problem # 3:  HYPERLIPIDEMIA (ICD-272.4) Once Dm and BP under better control, will start chol medicine. Recheck in 2 months after diet and exercise changes.    Diabetes Management Assessment/Plan:      The following lipid goals have been established for the patient: Total cholesterol goal of 200; LDL cholesterol goal of 100; HDL cholesterol goal of 40; Triglyceride goal of 200.

## 2010-09-28 NOTE — Miscellaneous (Signed)
Summary: carafate rx  Clinical Lists Changes  Medications: Added new medication of CARAFATE 1 GM  TABS (SUCRALFATE) one tablet twice daily x one week - Signed Rx of CARAFATE 1 GM  TABS (SUCRALFATE) one tablet twice daily x one week;  #14 x 0;  Signed;  Entered by: Weston Brass;  Authorized by: Hart Carwin MD;  Method used: Electronically to [facility] [address], [place], [place], [place]  [address], Ph: [phone], Fax: [phone]    Prescriptions: CARAFATE 1 GM  TABS (SUCRALFATE) one tablet twice daily x one week  #14 x 0   Entered by:   Weston Brass   Authorized by:   Hart Carwin MD   Signed by:   Weston Brass on 06/25/2009   Method used:   Electronically to        [facility] [address] (retail)       [address]       [place], [place]  [address]       Ph: [phone]       Fax: [phone]   RxID:   [id number]

## 2010-09-28 NOTE — Assessment & Plan Note (Signed)
Summary: SWELLING AND PAIN IN FEET/CLE   Vital Signs:  Patient profile:   60 year old male Height:      68.5 inches Weight:      303.2 pounds BMI:     45.60 Temp:     98.1 degrees F oral Pulse rate:   80 / minute Pulse rhythm:   regular BP sitting:   150 / 70  (left arm) Cuff size:   large  Vitals Entered By: Benny Lennert CMA Duncan Dull) (November 20, 2009 11:38 AM)  History of Present Illness: Pain in legs worse in last few weeks..had tried to start walking more... pain is focal over lower achilles, very tender and swollen over posterior foot.  PAin with walking more than standing. Cannot exercisie due to pain. Pain great with flexing.  In past has used diclofenac/meloxicam  for what I though was achilles tendonopathy..resolved. Did have ? stomach upset ? associated with NSAIDs at that time.  GERD, stable on Nexium daily.   Pain in calves tolerable, improving ...neg dopplers. Never got tramadol for pain   Problems Prior to Update: 1)  Blurred Vision  (ICD-368.8) 2)  Leg Pain, Bilateral  (ICD-729.5) 3)  Constipation  (ICD-564.00) 4)  Gastritis  (ICD-535.50) 5)  Screening, Colon Cancer  (ICD-V76.51) 6)  Chest Pain  (ICD-786.50) 7)  Early Satiety  (ICD-780.94) 8)  Abdominal Pain Right Upper Quadrant  (ICD-789.01) 9)  Abdominal Pain, Right Upper Quadrant  (ICD-789.01) 10)  Achilles Tendinitis, Bilateral  (ICD-726.71) 11)  Abdominal Pain, Right Upper Quadrant  (ICD-789.01) 12)  Healthy Adult Male  (ICD-V70.0) 73)  Special Screening For Malignant Neoplasms Colon  (ICD-V76.51) 14)  Special Screening Malignant Neoplasm of Prostate  (ICD-V76.44) 15)  Depressive Disorder  (ICD-311) 16)  Erectile Dysfunction  (ICD-302.72) 17)  Obesity  (ICD-278.00) 18)  Family History Diabetes 1st Degree Relative  (ICD-V18.0) 19)  Family History of Cad Male 1st Degree Relative <50  (ICD-V17.3) 20)  Osteoarthritis  (ICD-715.90) 21)  Hypertension  (ICD-401.9) 22)  Hyperlipidemia  (ICD-272.4) 23)   Diabetes Mellitus, Type II  (ICD-250.00) 24)  Coronary Artery Disease  (ICD-414.00) 25)  Hx of Renal Calculus, Hx of  (ICD-V13.01)  Current Medications (verified): 1)  Humulin 70/30 70-30 % Susp (Insulin Isophane & Regular) .... 70 Units Before Breakfast and 70 Units Before Supper 2)  Fluoxetine Hcl 40 Mg Caps (Fluoxetine Hcl) .Marland Kitchen.. 1 Tab By Mouth Daily 3)  Nexium 40 Mg Cpdr (Esomeprazole Magnesium) .... One Tablet By Mouth Twice Daily 30 Min Prior To Breakfast and Dinner 4)  Lisinopril 20 Mg Tabs (Lisinopril) .... Take 1 Tablet By Mouth Once A Day 5)  Mens Multivitamin Plus  Tabs (Multiple Vitamins-Minerals) .... Once Daily 6)  Fenofibrate 160 Mg Tabs (Fenofibrate) .Marland Kitchen.. 1 Tab By Mouth Daily 7)  Pravastatin Sodium 20 Mg Tabs (Pravastatin Sodium) .... Take 1 Tablet By Mouth Once A Day 8)  Meloxicam 15 Mg Tabs (Meloxicam) .Marland Kitchen.. 1 Tab By Mouth Daily  Allergies: 1)  ! Lipitor (Atorvastatin Calcium)  Past History:  Past medical, surgical, family and social histories (including risk factors) reviewed, and no changes noted (except as noted below).  Past Medical History: Reviewed history from 06/19/2009 and no changes required. Coronary artery disease/MILD. Diabetes mellitus, type II/INSULIN DEPENDENT Hyperlipidemia Hypertension Osteoarthritis Depression Kidney Stones Obesity  Past Surgical History: Reviewed history from 07/29/2009 and no changes required. 09/2000: Cardiac Cath  diffuse LAD, 30 % LCA  EF 50-65% 02/2002 back injury: workers comp 11/08 cath:no significant CAD  Unremarkable  Family History: Reviewed history from 07/29/2009 and no changes required. father died age 21 Dm, MI mother age 84 Alzheimer's, emphesema Family History of CAD Male 1st degree relative <50 Family History Diabetes 1st degree relative:Father brother ? neck cancer No FH of Colon Cancer:  Social History: Reviewed history from 03/26/2007 and no changes required. disabled because of back  injury finacial concerns wife with depression daughter died 91 months ago Married Never Smoked Alcohol use-no Drug use-no Regular exercise-yes  Review of Systems General:  Complains of fatigue; denies fever. CV:  Denies chest pain or discomfort. Resp:  Denies shortness of breath.  Physical Exam  General:  morbidly obese Lungs:  Normal respiratory effort, chest expands symmetrically. Lungs are clear to auscultation, no crackles or wheezes. Heart:  Normal rate and regular rhythm. S1 and S2 normal without gallop, murmur, click, rub or other extra sounds. Msk:  B ttp over insertion of achiles tendon, swelling at insertion of achilles Pulses:  R and L posterior tibial pulses are diminishedbilaterally  Extremities:  trace left pedal edema and trace right pedal edema.    Diabetes Management Exam:    Foot Exam (with socks and/or shoes not present):       Sensory-Pinprick/Light touch:          Left medial foot (L-4): normal          Left dorsal foot (L-5): normal          Left lateral foot (S-1): normal          Right medial foot (L-4): normal          Right dorsal foot (L-5): normal          Right lateral foot (S-1): normal       Sensory-Monofilament:          Left foot: normal          Right foot: normal       Inspection:          Left foot: normal          Right foot: normal       Nails:          Left foot: normal          Right foot: normal   Impression & Recommendations:  Problem # 1:  ACHILLES TENDINITIS, BILATERAL (ICD-726.71) Refer to Dr. Patsy Lager for further eval. In meantime, stretching, ice, elevation and meloxicam.   Complete Medication List: 1)  Humulin 70/30 70-30 % Susp (Insulin isophane & regular) .... 70 units before breakfast and 70 units before supper 2)  Fluoxetine Hcl 40 Mg Caps (Fluoxetine hcl) .Marland Kitchen.. 1 tab by mouth daily 3)  Nexium 40 Mg Cpdr (Esomeprazole magnesium) .... One tablet by mouth twice daily 30 min prior to breakfast and dinner 4)  Lisinopril  20 Mg Tabs (Lisinopril) .... Take 1 tablet by mouth once a day 5)  Mens Multivitamin Plus Tabs (Multiple vitamins-minerals) .... Once daily 6)  Fenofibrate 160 Mg Tabs (Fenofibrate) .Marland Kitchen.. 1 tab by mouth daily 7)  Pravastatin Sodium 20 Mg Tabs (Pravastatin sodium) .... Take 1 tablet by mouth once a day 8)  Meloxicam 15 Mg Tabs (Meloxicam) .Marland Kitchen.. 1 tab by mouth daily  Patient Instructions: 1)  COnsult appt with Dr. Patsy Lager for achilles tendonitis..? steroid injection/etc.  2)  Start meloxicam daily.  3)  ICe ankles, elevate above heart. 4)  Start gentle stretching exercsies.  Prescriptions: MELOXICAM 15 MG TABS (MELOXICAM) 1 tab by mouth daily  #30  x 1   Entered and Authorized by:   Kerby Nora MD   Signed by:   Kerby Nora MD on 11/20/2009   Method used:   Electronically to        [facility] (retail)       [address]       [place], [place]  [address]       Ph: [phone]       Fax: [phone]   RxID:   [id number]   Current Allergies (reviewed today): ! LIPITOR (ATORVASTATIN CALCIUM)

## 2010-09-28 NOTE — Progress Notes (Signed)
Summary: re chest pain  Phone Note Call from Patient   Caller: Spouse Call For: dr Ermalene Searing Summary of Call: pt has had chest pain since yesterday, eased up some today but he ate a cracker and it came back, wife thinks this is acid reflux and asks that pt either be seen or something called in. appt made for tomorrow, advised  to have pt take tums or [place] till then, ER if symptoms worsen Initial call taken by: Lowella Petties,  March 19, 2008 2:59 PM  Follow-up for Phone Call        Cardiac cath negative in 3/09. Likely reflux. Recommended prilosec but pt already tried. Likely will need different PPI. Will see in OV tommorow.  Follow-up by: Kerby Nora MD,  March 19, 2008 3:13 PM

## 2010-09-28 NOTE — Progress Notes (Signed)
Summary: prior auth needed for protonix  Phone Note From Pharmacy   Caller: [facility]  [phone] [employer] Summary of Call: Prior Berkley Harvey is needed for protonix, form is on your desk. Initial call taken by: Lowella Petties CMA,  May 21, 2009 8:37 AM

## 2010-09-28 NOTE — Assessment & Plan Note (Signed)
Summary: 2WK F/U / LFW   Vital Signs:  Patient profile:   60 year old male Height:      68.5 inches Weight:      293.50 pounds BMI:     44.14 Temp:     97.9 degrees F oral Pulse rate:   84 / minute Pulse rhythm:   regular BP sitting:   142 / 82  (left arm) Cuff size:   large  Vitals Entered By: Delilah Shan CMA (AAMA) (July 17, 2009 8:10 AM) CC: 2 week follow up   History of Present Illness:   At last OV 2 weeks ago ...we 1) Increase fluoxetine to 40 tabs po  mg daily...some improvement with motivation..mainly because pain gone. 2)  Increase lisinopril to 2 tabs daily.Marland KitchenMarland KitchenNeeds  BMET 7-10 days after increase Dx 401.1 3)  Start cholesterol medicaiton fenofibrate. 4)  Percocet for severe pain only 5)  Continue at higher dose of insulin if possible 70 Units daily...try to eat every 5 hours healthy foods. 6)referred for GI colonoscopy..fasting CBG today 120.  For his chest pain gastritis...he is now on nexium two times a day..since last seen pain is COMPLETEly GONE.  HAs not even filled te pain medication.  He feels like like he had pain improved with large BM.  Problems Prior to Update: 1)  Chest Pain  (ICD-786.50) 2)  Early Satiety  (ICD-780.94) 3)  Abdominal Pain Right Upper Quadrant  (ICD-789.01) 4)  Abdominal Pain, Right Upper Quadrant  (ICD-789.01) 5)  Achilles Tendinitis, Bilateral  (ICD-726.71) 6)  Abdominal Pain, Right Upper Quadrant  (ICD-789.01) 7)  Healthy Adult Male  (ICD-V70.0) 8)  Special Screening For Malignant Neoplasms Colon  (ICD-V76.51) 9)  Special Screening Malignant Neoplasm of Prostate  (ICD-V76.44) 10)  Depressive Disorder  (ICD-311) 11)  Erectile Dysfunction  (ICD-302.72) 12)  Obesity  (ICD-278.00) 13)  Family History Diabetes 1st Degree Relative  (ICD-V18.0) 14)  Family History of Cad Male 1st Degree Relative <50  (ICD-V17.3) 15)  Osteoarthritis  (ICD-715.90) 16)  Hypertension  (ICD-401.9) 17)  Hyperlipidemia  (ICD-272.4) 18)  Diabetes  Mellitus, Type II  (ICD-250.00) 19)  Coronary Artery Disease  (ICD-414.00) 20)  Hx of Renal Calculus, Hx of  (ICD-V13.01)  Current Medications (verified): 1)  Humulin 70/30 70-30 % Susp (Insulin Isophane & Regular) .... 70 Units Before Breakfast and 70 Units Before Supper 2)  Fluoxetine Hcl 40 Mg Caps (Fluoxetine Hcl) .Marland Kitchen.. 1 Tab By Mouth Daily 3)  Nexium 40 Mg Cpdr (Esomeprazole Magnesium) .... One Tablet By Mouth Twice Daily 30 Min Prior To Breakfast and Dinner 4)  Lisinopril 20 Mg Tabs (Lisinopril) .... Take 1 Tablet By Mouth Once A Day 5)  Carafate 1 Gm  Tabs (Sucralfate) .... One Tablet Twice Daily X One Week 6)  Fenofibrate 160 Mg Tabs (Fenofibrate) .Marland Kitchen.. 1 Tab By Mouth Daily 7)  Hydrocodone-Acetaminophen 10-325 Mg/41ml Soln (Hydrocodone-Acetaminophen) .Marland Kitchen.. 1 Tab By Mouth Q6 Hour As Needed Severe Pain 8)  Hydrocodone-Acetaminophen 10-325 Mg Tabs (Hydrocodone-Acetaminophen) .Marland Kitchen.. 1 Tab By Mouth Q6 Hours As Needed Pain  Allergies: 1)  ! Lipitor (Atorvastatin Calcium)  Past History:  Past medical, surgical, family and social histories (including risk factors) reviewed, and no changes noted (except as noted below).  Past Medical History: Reviewed history from 06/19/2009 and no changes required. Coronary artery disease/MILD. Diabetes mellitus, type II/INSULIN DEPENDENT Hyperlipidemia Hypertension Osteoarthritis Depression Kidney Stones Obesity  Past Surgical History: Reviewed history from 07/06/2007 and no changes required. 09/2000: Cardiac Cath  diffuse LAD, 30 %  LCA  EF 50-65% 02/2002 back injury: workers comp 11/08 cath:no significant CAD  Family History: Reviewed history from 06/19/2009 and no changes required. father died age 53 Dm, MI mother age 55 Alzheimer's, emphesema Family History of CAD Male 1st degree relative <50 Family History Diabetes 1st degree relative brother ? neck cancer No FH of Colon Cancer:  Social History: Reviewed history from 03/26/2007 and no  changes required. disabled because of back injury finacial concerns wife with depression daughter died 39 months ago Married Never Smoked Alcohol use-no Drug use-no Regular exercise-yes  Review of Systems General:  Denies fatigue and fever. CV:  Denies chest pain or discomfort. Resp:  Denies shortness of breath, sputum productive, and wheezing. GI:  Denies abdominal pain, bloody stools, constipation, and diarrhea. GU:  Denies dysuria.  Physical Exam  General:  obeses appearing male in NAD Mouth:  MMM Neck:  no carotid bruit or thyromegaly no cervical or supraclavicular lymphadenopathy  Lungs:  Normal respiratory effort, chest expands symmetrically. Lungs are clear to auscultation, no crackles or wheezes. Heart:  Normal rate and regular rhythm. S1 and S2 normal without gallop, murmur, click, rub or other extra sounds. Abdomen:  Bowel sounds positive,abdomen soft and non-tender without masses, organomegaly or hernias noted. Pulses:  R and L posterior tibial pulses are full and equal bilaterally  Extremities:  no edema    Impression & Recommendations:  Problem # 1:  ABDOMINAL PAIN RIGHT UPPER QUADRANT (ICD-789.01) resolved with large BM or ? due to two times a day Nexium treating acute and chronic gastritis seen on EGD. Continue two times a day Nexium for at least another few months.    Problem # 2:  CHEST PAIN (ICD-786.50) resolved  Problem # 3:  HYPERTENSION (ICD-401.9) Mild improvement..needs lifestyle change . Continue to follow. aMay need change of medicaition.  His updated medication list for this problem includes:    Lisinopril 20 Mg Tabs (Lisinopril) .Marland Kitchen... Take 1 tablet by mouth once a day  Orders: TLB-BMP (Basic Metabolic Panel-BMET) (80048-METABOL)  Problem # 4:  DIABETES MELLITUS, TYPE II (ICD-250.00) Improving per pt CBGs.  Continue on current dose of Humulin. Follow up as scheduled.  His updated medication list for this problem includes:    Humulin 70/30  70-30 % Susp (Insulin isophane & regular) .Marland KitchenMarland KitchenMarland KitchenMarland Kitchen 70 units before breakfast and 70 units before supper    Lisinopril 20 Mg Tabs (Lisinopril) .Marland Kitchen... Take 1 tablet by mouth once a day  Problem # 5:  DEPRESSIVE DISORDER (ICD-311) Improving on higher dose of medicaiton. NO SI His updated medication list for this problem includes:    Fluoxetine Hcl 40 Mg Caps (Fluoxetine hcl) .Marland Kitchen... 1 tab by mouth daily  Complete Medication List: 1)  Humulin 70/30 70-30 % Susp (Insulin isophane & regular) .... 70 units before breakfast and 70 units before supper 2)  Fluoxetine Hcl 40 Mg Caps (Fluoxetine hcl) .Marland Kitchen.. 1 tab by mouth daily 3)  Nexium 40 Mg Cpdr (Esomeprazole magnesium) .... One tablet by mouth twice daily 30 min prior to breakfast and dinner 4)  Lisinopril 20 Mg Tabs (Lisinopril) .... Take 1 tablet by mouth once a day 5)  Carafate 1 Gm Tabs (Sucralfate) .... One tablet twice daily x one week 6)  Fenofibrate 160 Mg Tabs (Fenofibrate) .Marland Kitchen.. 1 tab by mouth daily 7)  Hydrocodone-acetaminophen 10-325 Mg/37ml Soln (Hydrocodone-acetaminophen) .Marland Kitchen.. 1 tab by mouth q6 hour as needed severe pain 8)  Hydrocodone-acetaminophen 10-325 Mg Tabs (Hydrocodone-acetaminophen) .Marland Kitchen.. 1 tab by mouth q6 hours as needed  pain  Patient Instructions: 1)  On way out.Marland Kitchenspeak with Jacki Cones to get nexium financial assistance.  2)  Increase fiber and water...info given. 3)  Continue current medicaitons. 4)  keep appts as scheduled.   Current Allergies (reviewed today): ! LIPITOR (ATORVASTATIN CALCIUM)

## 2010-09-28 NOTE — Assessment & Plan Note (Signed)
Summary: per dr Ermalene Searing achilles tendonitis ? steroid injection/etc/rbh   Vital Signs:  Patient profile:   60 year old male Height:      68.5 inches Weight:      307.8 pounds BMI:     46.29 Temp:     98.1 degrees F oral Pulse rate:   80 / minute Pulse rhythm:   regular BP sitting:   170 / 82  (left arm) Cuff size:   large  Vitals Entered By: Benny Lennert CMA Duncan Dull) (November 26, 2009 11:05 AM)  Vision Screening:Left eye w/o correction: 20 / 20 Right Eye w/o correction: 20 / 100 Both eyes w/o correction:  20/ 20        Vision Entered By: Benny Lennert CMA Duncan Dull) (November 26, 2009 11:09 AM)   History of Present Illness: Chief complaint per bedsole achilles tendonitis ? steroid injection   Seen at Dr. Daphine Deutscher request for recommendations for achilles tendinopathy.  1. Additionally, Left eye for 1 month and last 2 wks has bothered him and see out of it (feels like film over eye) No eye MD. Non-acute.  Right and left achilles tendinopathy -- has ben ongoing for all winter. -- started before christmas, but today is bothering him a lot.    his Achilles tendinopathy will be variable, sometimes a walker more than the left, and other times or ore on the right. Currently he is having significant pain on the right, the left is not bothering him that much.   He has not had any focal injury that would produce a partial rupture that he can recall. He has no known history of any prior traumatic injury, fracture, or operative intervention in the affected area.  His pain does localize focally to the swollen nodule at both Achilles insertions.   Allergies: 1)  ! Lipitor (Atorvastatin Calcium)  Past History:  Past medical, surgical, family and social histories (including risk factors) reviewed, and no changes noted (except as noted below).  Past Medical History: Reviewed history from 06/19/2009 and no changes required. Coronary artery disease/MILD. Diabetes mellitus, type  II/INSULIN DEPENDENT Hyperlipidemia Hypertension Osteoarthritis Depression Kidney Stones Obesity  Past Surgical History: Reviewed history from 07/29/2009 and no changes required. 09/2000: Cardiac Cath  diffuse LAD, 30 % LCA  EF 50-65% 02/2002 back injury: workers comp 11/08 cath:no significant CAD  Unremarkable  Family History: Reviewed history from 07/29/2009 and no changes required. father died age 5 Dm, MI mother age 54 Alzheimer's, emphesema Family History of CAD Male 1st degree relative <50 Family History Diabetes 1st degree relative:Father brother ? neck cancer No FH of Colon Cancer:  Social History: Reviewed history from 03/26/2007 and no changes required. disabled because of back injury finacial concerns wife with depression daughter died 20 months ago Married Never Smoked Alcohol use-no Drug use-no Regular exercise-yes  Review of Systems       REVIEW OF SYSTEMS  GEN: No systemic complaints, no fevers, chills, sweats, or other acute illnesses MSK: Detailed in the HPI GI: tolerating PO intake without difficulty Neuro: No numbness, parasthesias, or tingling associated. Otherwise the pertinent positives of the ROS are noted above.    Physical Exam  General:  morbidlesey obwell-hydrated, appropriate dress, and normal appearance.   Head:  normocephalic, atraumatic, and no abnormalities observed.   Ears:  no external deformities.   Nose:  no external deformity.   Lungs:  normal respiratory effort, no intercostal retractions, and no accessory muscle use.   Msk:  bilaterally, notable pes planus. He  also has some significant transverse arch breakdown.  nontender throughout forefoot, midfoot, bilateral malleoli on each  ankle.  Nontender at the plantar fascia, all ligamentous structures, peroneal and posterior tibialis tendons.  At the insertion point, bilateral Achilles, there is a palpable nodule that is tender to palpation.   Impression &  Recommendations:  Problem # 1:  ACHILLES TENDINITIS, BILATERAL (ICD-726.71) Pathophysiology of achilles tendinopathy reviewed.  Additionally, I have given the patient the program emphasizing eccentric overloading detailed in the instructions based on Dr. Renato Gails work and protocols. eccentric overload he has been demonstrated in repetitive, followup  imaging, and on pathological specimen to  help remodel  avascular  tendon and promote  good blood flow and increased tensile strength.  Placed in Tuli's heel cups. NTG patches have been demonstrated to encourage blood flow in chronic tendinopathy, diminish pain, and encourage remodeling of tendon. Encouraged ice massage.   local corticosteroid injections are contraindicated in Achilles tendinopathy - the rupture rate is very high. Dexamethasone iontophoresis can be used in select patients, but in this patient with a BMI of 46, this places him at too great a risk for rupture.  BMI, foot structure, and some financial limitations make this a difficult case.  Problem # 2:  BLURRED VISION (ICD-368.8)  Orders: Ophthalmology Referral (Ophthalmology)  Complete Medication List: 1)  Humulin 70/30 70-30 % Susp (Insulin isophane & regular) .... 70 units before breakfast and 70 units before supper 2)  Fluoxetine Hcl 40 Mg Caps (Fluoxetine hcl) .Marland Kitchen.. 1 tab by mouth daily 3)  Nexium 40 Mg Cpdr (Esomeprazole magnesium) .... One tablet by mouth twice daily 30 min prior to breakfast and dinner 4)  Lisinopril 20 Mg Tabs (Lisinopril) .... Take 1 tablet by mouth once a day 5)  Mens Multivitamin Plus Tabs (Multiple vitamins-minerals) .... Once daily 6)  Fenofibrate 160 Mg Tabs (Fenofibrate) .Marland Kitchen.. 1 tab by mouth daily 7)  Pravastatin Sodium 20 Mg Tabs (Pravastatin sodium) .... Take 1 tablet by mouth once a day 8)  Tramadol Hcl 50 Mg Tabs (Tramadol hcl) .Marland Kitchen.. 1 tab by mouth daily as needed pain 9)  Meloxicam 15 Mg Tabs (Meloxicam) .Marland Kitchen.. 1 tab by mouth  daily  Patient Instructions: 1)  Achilles Rehab 2)  USE YOUR HEEL CUPS 3)  GET SOME NEW INSOLES:  4)  [employer] INSOLES, [email or web address] 5)  OR SPENCO 6)  Begin with easy walking, heel, toe and backwards 7)  Calf raises on a step 8)  First lower and then raise on 1 foot 9)  If this is painful lower on 1 foot but do the heel raise on both feet 10)  Begin with 3 sets of 10 repetitions 11)  Increase by 5 repetitions every 3 days 12)  Goal is 3 sets of 30 repetitions 13)  Do with both knee straight and knee at 20 degrees of flexion 14)  If pain persists at 3 sets of 30 - add backpack with 5 lbs 15)  Increase by 5 lbs per week to max of 30 lbs   Current Allergies (reviewed today): ! LIPITOR (ATORVASTATIN CALCIUM)

## 2010-09-28 NOTE — Progress Notes (Signed)
Summary: Ultrasound report pt still hurting badly.  Phone Note Call from Patient Call back at [phone]   Caller: Patient Call For: Dr. Patsy Lager Summary of Call: Pt calls has not heard from ultrasound report from Thursdaky. Still hurting bad. Pt request call re ultrasound report. [phone].Please advise.  Initial call taken by: Lewanda Rife LPN,  June 08, 2009 4:54 PM  Follow-up for Phone Call        discussed with him that this was normal.  Normal CT chest, abd, pelvis recently, too.  Tells me he has been taking a lot of Advil and Alleve -- Told him to stop all this, stop Voltaren and c/w Prilosec  Please schedule a f/u this week with Dr. Ermalene Searing Follow-up by: Hannah Beat MD,  June 08, 2009 5:44 PM

## 2010-09-28 NOTE — Progress Notes (Signed)
Summary: Coreg, Fluoxetine, Humulin 70/30  Phone Note Refill Request Message from:  Fax from Pharmacy on September 05, 2007 5:05 PM  Refills Requested: Medication #1:  HUMULIN 70/30 70-30 %  SUSP 15 units two times a day  Medication #2:  COREG 6.25 MG  TABS Take 1 tablet by mouth two times a day  Medication #3:  FLUOXETINE HCL 10 MG  CAPS Take 1 tablet by mouth once a day  Method Requested: Will fax back to pharmacy after approval Initial call taken by: Delilah Shan,  September 05, 2007 5:06 PM      Prescriptions: FLUOXETINE HCL 10 MG  CAPS (FLUOXETINE HCL) Take 1 tablet by mouth once a day  #30 x 5   Entered and Authorized by:   Kerby Nora MD   Signed by:   Kerby Nora MD on 09/05/2007   Method used:   Handwritten   RxID:   [id number] HUMULIN 70/30 70-30 %  SUSP (INSULIN ISOPHANE & REGULAR) 15 units two times a day  #1 box x 5   Entered and Authorized by:   Kerby Nora MD   Signed by:   Kerby Nora MD on 09/05/2007   Method used:   Handwritten   RxID:   [id number] COREG 6.25 MG  TABS (CARVEDILOL) Take 1 tablet by mouth two times a day  #60 x 5   Entered and Authorized by:   Kerby Nora MD   Signed by:   Kerby Nora MD on 09/05/2007   Method used:   Handwritten   RxID:   [id number]    Faxed

## 2010-09-28 NOTE — Medication Information (Signed)
Summary: [facility]  [facility]   Imported By: Lanelle Bal 10/20/2009 08:18:01  _____________________________________________________________________  External Attachment:    Type:   Image     Comment:   External Document

## 2010-09-28 NOTE — Letter (Signed)
Summary: Patient Notice-Endo Biopsy Results  [facility]  [address] [place], [place] [address]   Phone: [phone]  Fax: [phone]        June 30, 2009 MRN: [id number]    [name] [address] [place], [place]  [address]    Dear [name],  I am pleased to inform you that the biopsies taken during your recent endoscopic examination did not show any evidence of cancer upon pathologic examination.The tissue from Your stomach showed mild inflammation  Additional information/recommendations:  __No further action is needed at this time.  Please follow-up with      your primary care physician for your other healthcare needs.  __ Please call [phone] to schedule a return visit to review      your condition.  __x Continue with the treatment plan as outlined on the day of your      exam.  _   Please call us if you are having persistent problems or have questions about your condition that have not been fully answered at this time.  Sincerely,  Hart Carwin MD  This letter has been electronically signed by your physician.  Appended Document: Patient Notice-Endo Biopsy Results Letter mailed to patient.

## 2010-09-28 NOTE — Assessment & Plan Note (Signed)
Summary: coughing,chest congestion/bir   Vital Signs:  Patient Profile:   60 Years Old Male Height:     68.5 inches Weight:      271 pounds Temp:     98.8 degrees F oral Pulse rate:   90 / minute BP sitting:   158 / 90  (right arm) Cuff size:   large  Vitals Entered By: Cooper Render (December 25, 2007 8:26 AM)                 Chief Complaint:  URI sx.  History of Present Illness: Here for URI sx x 2wks--getting worse, nasal congestion, short of breath, wheezing, cough, fever/chills at onset.  Taking Nyquil, etc OTCs, cough syrup OTC.  Must sit up to sleep for off and on x2wks--worse past 4d.   Now disabled--due to back problems.    Current Allergies (reviewed today): ! LIPITOR (ATORVASTATIN CALCIUM)     Review of Systems      See HPI   Physical Exam  General:     alert, well-developed, well-nourished, and well-hydrated.  morbidly obese  mild reap distress Ears:     TMs retracted with some fluid Nose:     no airflow obstruction, mucosal erythema, and mucosal edema.   Mouth:     no exudates and pharyngeal erythema.   Lungs:     moist harsh cough, wheezing at end of expir. pulse Ox is 91-92 sitting in exam room on room air --8:45--started HHNeb with Xopenex 1.25 --9:20--less tight, chest without wheezes, cough moister 9:25 started HHNeb withXopenex 1.25--has drunk 2 glasses of water 10:00--no wheezes heard, cough looser Neurologic:     alert & oriented X3 and gait normal.   Cervical Nodes:     no anterior cervical adenopathy and no posterior cervical adenopathy.   Psych:     normally interactive and good eye contact.      Impression & Recommendations:  Problem # 1:  WHEEZING (ICD-786.07) Assessment: New HHNeb x2 done with improvement gave Ventolin HFA with instructions as how to use 1-2 puffs q4h as needed chest tightness/wheezing see back tomorrow if not improved, or to ER tonight if gets tight and cant move air Orders: Xopenex 1.25mg   (Z6109) Nebulizer Tx (60454) Nebulizer Tx (09811) Xopenex 1.25mg  (B1478)   Problem # 2:  BRONCHITIS, ACUTE (ICD-466.0) Assessment: New continue comfort care measures: increase po fluids, rest, tylenol or IBP as needed willl start on Amoxicillin two times a day x7 His updated medication list for this problem includes:    Amoxicillin 500 Mg Tabs (Amoxicillin) .Marland Kitchen... 2 tabs two times a day for 7d see back in 5d if not improved    Ventolin Hfa 108 (90 Base) Mcg/act Aers (Albuterol sulfate) .Marland Kitchen... 1-2 puffs q 4h as needed chest tightness of wheezing  His updated medication list for this problem includes:    Amoxicillin 500 Mg Tabs (Amoxicillin) .Marland Kitchen... 2 tabs two times a day for 7d    Ventolin Hfa 108 (90 Base) Mcg/act Aers (Albuterol sulfate) .Marland Kitchen... 1-2 puffs q 4h as needed chest tightness of wheezing  Orders: Nebulizer Tx (29562)   Complete Medication List: 1)  Advil 200 Mg Caps (Ibuprofen) .... As needed 2)  Mens Multivitamin Plus Tabs (Multiple vitamins-minerals) .... Once daily 3)  Enalapril Maleate 20 Mg Tabs (Enalapril maleate) .... 2 tab by mouth daily 4)  Glipizide 10 Mg Tabs (Glipizide) .... Take 1 tablet by mouth two times a day 5)  Coreg 6.25 Mg Tabs (Carvedilol) .... Take 1  tablet by mouth two times a day 6)  Humulin 70/30 70-30 % Susp (Insulin isophane & regular) .... 20  units two times a day 7)  Amlodipine Besylate 5 Mg Tabs (Amlodipine besylate) .... Take 1 tablet by mouth once a day 8)  Amoxicillin 500 Mg Tabs (Amoxicillin) .... 2 tabs two times a day for 7d 9)  Ventolin Hfa 108 (90 Base) Mcg/act Aers (Albuterol sulfate) .Marland Kitchen.. 1-2 puffs q 4h as needed chest tightness of wheezing     Prescriptions: AMOXICILLIN 500 MG  TABS (AMOXICILLIN) 2 tabs two times a day for 7d  #28 x 0   Entered and Authorized by:   Gildardo Griffes FNP   Signed by:   Gildardo Griffes FNP on 12/25/2007   Method used:   Print then Give to Patient   RxID:    [id number]  ] Prior Medications (reviewed today): ADVIL 200 MG  CAPS (IBUPROFEN) as needed MENS MULTIVITAMIN PLUS   TABS (MULTIPLE VITAMINS-MINERALS) once daily ENALAPRIL MALEATE 20 MG  TABS (ENALAPRIL MALEATE) 2 tab by mouth daily GLIPIZIDE 10 MG  TABS (GLIPIZIDE) Take 1 tablet by mouth two times a day COREG 6.25 MG  TABS (CARVEDILOL) Take 1 tablet by mouth two times a day HUMULIN 70/30 70-30 %  SUSP (INSULIN ISOPHANE & REGULAR) 20  units two times a day AMLODIPINE BESYLATE 5 MG  TABS (AMLODIPINE BESYLATE) Take 1 tablet by mouth once a day Current Allergies (reviewed today): ! LIPITOR (ATORVASTATIN CALCIUM)  Medication Administration  Medication # 1:    Medication: Xopenex 1.25mg     Diagnosis: WHEEZING (ICD-786.07)    Dose: 1.25/28ml    Route: inhaled    Exp Date: 11/09    Lot #: [id number]    Mfr: sepracor    Given by: Gildardo Griffes FNP (December 26, 2007 7:34 AM)  Medication # 2:    Medication: Xopenex 1.25mg     Diagnosis: WHEEZING (ICD-786.07)    Dose: 1.25/65ml    Route: inhaled    Exp Date: 11/09    Lot #: [id number]    Mfr: sepracor    Given by: Gildardo Griffes FNP (December 26, 2007 7:34 AM)  Orders Added: 1)  Xopenex 1.25mg  [Y4034] 2)  Est. Patient Level IV [74259] 3)  Nebulizer Tx [94640] 4)  Nebulizer Tx [94640] 5)  Nebulizer Tx [94640] 6)  Xopenex 1.25mg  [D6387]
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
## 2010-09-28 NOTE — Consult Note (Signed)
Summary: [facility]/Consultation Report/Dr. Earlene Plater  [facility]/Consultation Report/Dr. Earlene Plater   Imported By: Mickle Asper 11/22/2007 15:49:10  _____________________________________________________________________  External Attachment:    Type:   Image     Comment:   External Document

## 2010-09-28 NOTE — Medication Information (Signed)
Summary: [facility] Diabetic [facility]lies Order  [employer] [facility] Diabetic Supplies Order   Imported By: Beau Fanny 07/23/2008 08:52:40  _____________________________________________________________________  External Attachment:    Type:   Image     Comment:   External Document

## 2010-09-28 NOTE — Letter (Signed)
Summary: [facility]  [facility]   Imported By: Beau Fanny 11/22/2007 13:26:30  _____________________________________________________________________  External Attachment:    Type:   Image     Comment:   External Document

## 2010-09-28 NOTE — Assessment & Plan Note (Signed)
Summary: per dr copland/[facility]   Vital Signs:  Patient profile:   60 year old male Height:      68.5 inches Weight:      289.4 pounds BMI:     43.52 Temp:     98.0 degrees F oral Pulse rate:   76 / minute Pulse rhythm:   regular BP sitting:   120 / 84  (left arm) Cuff size:   large  Vitals Entered By: Benny Lennert CMA (AAMA) (June 11, 2009 1:01 PM)  History of Present Illness: Chief complaint per dr. Patsy Lager  Continues to have right upper quadrant pain..worse after eating gravy biscuit last night. Has been going on x 5 weeks. Pain occ in epigastrum and radiates to back. 5/10 on pain scale....sharp pain severe intermittant.  No nausea no vomiting. Feels numb feeling over right chest wall. Using omeprazole 40 mg daily... minimal improvement.   [place] looked normal  Stopped aleve and advil..has decreased meloxicam to once daily.  Ankle pain is resolved with exercsie.    Problems Prior to Update: 1)  Early Satiety  (ICD-780.94) 2)  Abdominal Pain Right Upper Quadrant  (ICD-789.01) 3)  Abdominal Pain, Right Upper Quadrant  (ICD-789.01) 4)  Achilles Tendinitis, Bilateral  (ICD-726.71) 5)  Abdominal Pain, Right Upper Quadrant  (ICD-789.01) 6)  Healthy Adult Male  (ICD-V70.0) 7)  Special Screening For Malignant Neoplasms Colon  (ICD-V76.51) 8)  Special Screening Malignant Neoplasm of Prostate  (ICD-V76.44) 9)  Depressive Disorder  (ICD-311) 10)  Erectile Dysfunction  (ICD-302.72) 11)  Obesity  (ICD-278.00) 12)  Family History Diabetes 1st Degree Relative  (ICD-V18.0) 13)  Family History of Cad Male 1st Degree Relative <50  (ICD-V17.3) 14)  Osteoarthritis  (ICD-715.90) 15)  Hypertension  (ICD-401.9) 16)  Hyperlipidemia  (ICD-272.4) 17)  Diabetes Mellitus, Type II  (ICD-250.00) 18)  Coronary Artery Disease  (ICD-414.00) 19)  Hx of Renal Calculus, Hx of  (ICD-V13.01)  Current Medications (verified): 1)  Advil 200 Mg  Caps (Ibuprofen) .... As Needed 2)  Mens Multivitamin  Plus   Tabs (Multiple Vitamins-Minerals) .... Once Daily 3)  Coreg 6.25 Mg  Tabs (Carvedilol) .... Take 1 Tablet By Mouth Two Times A Day 4)  Ventolin Hfa 108 (90 Base) Mcg/act  Aers (Albuterol Sulfate) .Marland Kitchen.. 1-2 Puffs Q 4h As Needed Chest Tightness of Wheezing 5)  Novolog Mix 70/30 70-30 % Susp (Insulin Aspart Prot & Aspart) .... 70 Units Prior  To Breakfast,  70 Units Prior To Supper 6)  Fish Oil   Oil (Fish Oil) .... Takes 2 Per Day 7)  Pravastatin Sodium 40 Mg Tabs (Pravastatin Sodium) .... Take 1 Tablet By Mouth Once A Day 8)  Metformin Hcl 1000 Mg Tabs (Metformin Hcl) .... Take 1 Tablet By Mouth Two Times A Day 9)  Fluoxetine Hcl 20 Mg Caps (Fluoxetine Hcl) .Marland Kitchen.. 1 Tab By Daily 10)  Omeprazole 40 Mg Cpdr (Omeprazole) .Marland Kitchen.. 1 Tab By Mouth Daily 11)  Diclofenac Sodium 75 Mg Tbec (Diclofenac Sodium) .Marland Kitchen.. 1 Tab By Mouth Two Times A Day As Needed Pain in Ankles  Allergies (verified): 1)  ! Lipitor (Atorvastatin Calcium)  Past History:  Past medical, surgical, family and social histories (including risk factors) reviewed, and no changes noted (except as noted below).  Past Medical History: Reviewed history from 12/05/2006 and no changes required. Coronary artery disease Diabetes mellitus, type II Hyperlipidemia Hypertension Osteoarthritis  Past Surgical History: Reviewed history from 07/06/2007 and no changes required. 09/2000: Cardiac Cath  diffuse LAD, 30 %  LCA  EF 50-65% 02/2002 back injury: workers comp 11/08 cath:no significant CAD  Family History: Reviewed history from 12/05/2006 and no changes required. father died age 34 Dm, MI mother age 69 Alzheimer's, emphesema Family History of CAD Male 1st degree relative <50 Family History Diabetes 1st degree relative brother ? neck cancer  Social History: Reviewed history from 03/26/2007 and no changes required. disabled because of back injury finacial concerns wife with depression daughter died 30 months ago Married Never  Smoked Alcohol use-no Drug use-no Regular exercise-yes  Review of Systems General:  Denies fatigue. CV:  Denies lightheadness, swelling of feet, and swelling of hands. Resp:  Denies shortness of breath. GI:  Complains of abdominal pain; denies bloody stools, constipation, and diarrhea. GU:  Denies dysuria.  Physical Exam  General:  alert, well-developed, well-nourished, and well-hydrated.  morbidly obese   Mouth:  MMM Neck:  no carotid bruit or thyromegaly no cervical or supraclavicular lymphadenopathy  Lungs:  Normal respiratory effort, chest expands symmetrically. Lungs are clear to auscultation, no crackles or wheezes. Heart:  Normal rate and regular rhythm. S1 and S2 normal without gallop, murmur, click, rub or other extra sounds. Abdomen:  Ttp over right upper quadrant and partially over epigastrum, no rebound, no guarding, no masses.no hepatomegaly and no splenomegaly.     Impression & Recommendations:  Problem # 1:  ABDOMINAL PAIN, RIGHT UPPER QUADRANT (ICD-789.01) Has had normal Abd/pelvic CT in hospital. Neg cardiac workup. Normal [place].  Symptoms still seem most consistent with gallbladder dysfunction.  Will send for HIDA scan to determin functioning of gallbladder.  If normal ejection...will refer to GI for possible ENDO to eval for ulcer, etc.  Less likely MSK issue.   Pt has stopped NSAIDs, instructed to avoid other OTC meds that can irritate stomach. ( of note he did say stomach felt BETTER after taking NSAID, not worse)  Orders: Radiology Referral (Radiology) TLB-BMP (Basic Metabolic Panel-BMET) (80048-METABOL) TLB-Hepatic/Liver Function Pnl (80076-HEPATIC) TLB-Lipase (83690-LIPASE)  Problem # 2:  ACHILLES TENDINITIS, BILATERAL (ICD-726.71) Resolved with exercises, stretching. Able to walk and excercise...but now states abdominal pain limiting him.  Complete Medication List: 1)  Advil 200 Mg Caps (Ibuprofen) .... As needed 2)  Mens Multivitamin Plus Tabs (Multiple  vitamins-minerals) .... Once daily 3)  Coreg 6.25 Mg Tabs (Carvedilol) .... Take 1 tablet by mouth two times a day 4)  Ventolin Hfa 108 (90 Base) Mcg/act Aers (Albuterol sulfate) .Marland Kitchen.. 1-2 puffs q 4h as needed chest tightness of wheezing 5)  Novolog Mix 70/30 70-30 % Susp (Insulin aspart prot & aspart) .... 70 units prior  to breakfast,  70 units prior to supper 6)  Fish Oil Oil (Fish oil) .... Takes 2 per day 7)  Pravastatin Sodium 40 Mg Tabs (Pravastatin sodium) .... Take 1 tablet by mouth once a day 8)  Metformin Hcl 1000 Mg Tabs (Metformin hcl) .... Take 1 tablet by mouth two times a day 9)  Fluoxetine Hcl 20 Mg Caps (Fluoxetine hcl) .Marland Kitchen.. 1 tab by daily 10)  Omeprazole 40 Mg Cpdr (Omeprazole) .Marland Kitchen.. 1 tab by mouth daily 11)  Diclofenac Sodium 75 Mg Tbec (Diclofenac sodium) .Marland Kitchen.. 1 tab by mouth two times a day as needed pain in ankles  Patient Instructions: 1)  Referral Appointment Information 2)  Day/Date: 3)  Time: 4)  Place/MD: 5)  Address: 6)  Phone/Fax: 7)  Patient given appointment information. Information/Orders faxed/mailed.  8)  Stop omeprazole...change to Nexium once daily  9)  Stop meloxicam, advil, aleve...may use tylenol.  10)  Eat low fat , low spice foods. Avoid tomsato and citris.  11)  Please schedule a follow-up appointment in 2 weeksabdominal pain.   Current Allergies (reviewed today): ! LIPITOR (ATORVASTATIN CALCIUM)

## 2010-09-28 NOTE — Letter (Signed)
Summary: Diabetic Instructions  [facility]  [address] [place], [place] [address]   Phone: [phone]  Fax: [phone]    JELAN BATTERTON [birth year] MRN: [id number]    _ x _   INSULIN (LONG ACTING) MEDICATION INSTRUCTIONS (Lantus, NPH, 70/30, Humulin, Novolin-N)   The day before your procedure:   Take  your regular evening dose    The day of your procedure:   Do not take your morning dose

## 2010-09-28 NOTE — Letter (Signed)
Summary: [name]  [facility]   Imported By: Lanelle Bal 12/08/2009 13:56:03  _____________________________________________________________________  External Attachment:    Type:   Image     Comment:   External Document  Appended Document: Orders Update    Clinical Lists Changes  Observations: Added new observation of EYES COMMENT: 11/2010 (12/08/2009 14:25) Added new observation of DMEYEEXMRES: no DR (12/03/2009 14:26) Added new observation of DIAB EYE EX: no DR (12/03/2009 14:26)       Diabetes Management History:      He is (or has been) enrolled in the "Diabetic Education Program".  He is not checking home blood sugars.  He says that he is not exercising regularly.    Diabetes Management Exam:    Eye Exam:       Eye Exam done elsewhere          Date: 12/03/2009          Results: no DR          Done by: Nile Riggs  Diabetes Management Assessment/Plan:      The following lipid goals have been established for the patient: Total cholesterol goal of 200; LDL cholesterol goal of 70; HDL cholesterol goal of 40; Triglyceride goal of 150.  His blood pressure goal is < 130/80.    Current Insulin Use:    Longer Acting Insulin:       Type:  Humulin 70/30       Dosage:  15 units at breakfast; 15 units at bedtime.

## 2010-09-28 NOTE — Assessment & Plan Note (Signed)
Summary: ruq abdominal pain/sheri   History of Present Illness Visit Type: consult  Primary GI MD: Lina Sar MD Primary Provider: Kerby Nora, MD Requesting Provider: Kerby Nora, MD Chief Complaint: RUQ abd pain, chest pain, nausea, and bloating  History of Present Illness:   60  YO MALE ,NEW TO GI TODAY,REFERRED FOR EVALUATION OF RIGHT UPPER ABDOMINAL ,RIGHT CHEST AND EPIGASTRIC PAIN. HE HAD RECENT HOSPITALIZATION IN SEPT 2010 FOR THIS SAM E PAIN WHICH HAS BEEN ONGOING FOR OVER A MONTH. WORKUP AT THAT TOME ,NEGATIVE FOR CARDIAC ETIOLOGY-PRIOR NEGATIVE CATH 08. HE HAD CT ANGIO OF CHEST/ABDOMEN/PELVIS ALL NEGATIVE EXCEPT FOR LUMBAR SPINE. OUTPATIENT HE HAD ABDOMINAL US -NORMAL, AND CCK HIDA SCAN WITH EF 99 %. HE WAS STARTED ON NEXIUM ABOUT A WEEK AGO,MAY BE HELPING A LITTLE . HE C/O FEELING FULL,LIKE HIS FOOD IS SITTING IN HIS CHEST,SOME EARLY SATIEITY. NO NAUSEA,VOMITING,NO REGURGITATION,NO REFLUX,HEARTBURN ,ETC. APPETITE FAIR,NO WT LOSS,BMS NORMAL.HE DESCRIBES THE PAIN AS A DEEP PAIN,WHICH GOES FROM RIGHT CHEST TO RUQ AROUND INTO BACK AND SOMETIMES THRU TO HIS BACK,SXS FAIRLY CONSTANT. SOMETIMES HE FEELS SWOLLEN ON THE RIGHT SIDE,ALMOST NUMB. NO INJURY ETC.   GI Review of Systems    Reports abdominal pain, chest pain, and  dysphagia with solids.     Location of  Abdominal pain: RUQ.    Denies acid reflux, belching, bloating, dysphagia with liquids, heartburn, loss of appetite, nausea, vomiting, vomiting blood, and  weight loss.        Denies anal fissure, black tarry stools, change in bowel habit, constipation, diarrhea, diverticulosis, fecal incontinence, heme positive stool, hemorrhoids, irritable bowel syndrome, jaundice, light color stool, liver problems, rectal bleeding, and  rectal pain.    Current Medications (verified): 1)  Humulin 70/30 70-30 % Susp (Insulin Isophane & Regular) .... 70 Units Before Breakfast and 70 Units Before Supper 2)  Fluoxetine Hcl 20 Mg Caps (Fluoxetine Hcl)  .Marland Kitchen.. 1 Tab By Daily 3)  Nexium 40 Mg Cpdr (Esomeprazole Magnesium) .... One Tablet By Mouth Once Daily 4)  Lisinopril 10 Mg Tabs (Lisinopril) .... One Tablet By Mouth Once Daily  Allergies (verified): 1)  ! Lipitor (Atorvastatin Calcium)  Past History:  Past Medical History: Coronary artery disease/MILD. Diabetes mellitus, type II/INSULIN DEPENDENT Hyperlipidemia Hypertension Osteoarthritis Depression Kidney Stones Obesity  Past Surgical History: Reviewed history from 07/06/2007 and no changes required. 09/2000: Cardiac Cath  diffuse LAD, 30 % LCA  EF 50-65% 02/2002 back injury: workers comp 11/08 cath:no significant CAD  Family History: father died age 78 Dm, MI mother age 71 Alzheimer's, emphesema Family History of CAD Male 1st degree relative <50 Family History Diabetes 1st degree relative brother ? neck cancer No FH of Colon Cancer:  Social History: Reviewed history from 03/26/2007 and no changes required. disabled because of back injury finacial concerns wife with depression daughter died 77 months ago Married Never Smoked Alcohol use-no Drug use-no Regular exercise-yes  Review of Systems       The patient complains of depression-new, fatigue, headaches-new, sleeping problems, swelling of feet/legs, thirst - excessive, and urination - excessive.  The patient denies allergy/sinus, anemia, anxiety-new, arthritis/joint pain, back pain, blood in urine, breast changes/lumps, change in vision, confusion, cough, coughing up blood, fainting, fever, hearing problems, heart murmur, heart rhythm changes, itching, muscle pains/cramps, night sweats, nosebleeds, shortness of breath, skin rash, sore throat, swollen lymph glands, urination changes/pain, urine leakage, vision changes, and voice change.         ROS OTHERWISE AS IN HPI  Vital Signs:  Patient profile:   60 year old male Height:      68.5 inches Weight:      293 pounds BMI:     44.06 BSA:     2.42 Pulse rate:    76 / minute Pulse rhythm:   regular BP sitting:   142 / 84  (left arm) Cuff size:   large  Vitals Entered By: Ok Anis CMA (June 19, 2009 8:58 AM)  Physical Exam  General:  Well developed, well nourished, no acute distress.,OBESE Head:  Normocephalic and atraumatic. Eyes:  PERRLA, no icterus. Neck:  Supple; no masses or thyromegaly. Chest Wall:  SOME TENDERNESS WITH HARD PALPATION OF CHEST WALL ON RIGHT Lungs:  Clear throughout to auscultation. Heart:  Regular rate and rhythm; no murmurs, rubs,  or bruits. Abdomen:  LARGE,SOFT, MILD TENDERNESS RUQ ,SUBXYPHOID, NO MASS, NO HSM,BS +,NO SPLASH, Rectal:  NOT DONE Extremities:  No clubbing, cyanosis, edema or deformities noted. Neurologic:  Alert and  oriented x4;  grossly normal neurologically. Psych:  Alert and cooperative. Normal mood and affect.depressed affect.     Impression & Recommendations:  Problem # 1:  CHEST PAIN (ICD-786.50) Assessment New 60 YO MALE WITH ONE MONTH HX OF CHEST,RIGHT UPPER ABDOMINAL PAIN,ASSOCIATED WITH FULLNESS IN CHEST,VAGUE DYSPHAGIA. NEGATIVE CARDIAC WORKUP,NEGATIVE GB WORKUP. R/O ESOPHAGEAL PAIN,STRICTURE,GASTROPARESIS. ALSO CONCERNED HE MAY HAVE MUSCULOSKETAL COMPONENT .  INCREASE NEXIUM TO 40 MG TWICE DAILY SCHEDULE EGD  WITH DR Juanda Chance WITH POSSIBLE DILATION-PROCEDURE DISCUSSED IN DETAIL WITH PT AND WIFE FURTHER PLANS PENDING EGD.  Problem # 2:  DIABETES MELLITUS, TYPE II (ICD-250.00) Assessment: Comment Only INSULIN DEPENDENT  Problem # 3:  OSTEOARTHRITIS (ICD-715.90) Assessment: Comment Only DISABLED SECONDARY TO LUMBAR SPINE DISEASE  Problem # 4:  HYPERTENSION (ICD-401.9) Assessment: Comment Only  Problem # 5:  DEPRESSIVE DISORDER (ICD-311) Assessment: Comment Only  Other Orders: EGD (EGD)  Patient Instructions: 1)  We have scheduled the Endoscopy with Dr. Lina Sar for 06-25-09. 2)  Endoscopy and conscous sedation brochure provided. 3)  Nexium samples provided.  Take 1  tab 30 min prior to breakfast and dinner. ( twice daily).  4)  Copy sent to : Kerby Nora, MD 5)  The medication list was reviewed and reconciled.  All changed / newly prescribed medications were explained.  A complete medication list was provided to the patient / caregiver.

## 2010-09-28 NOTE — Letter (Signed)
Summary: Diabetic Instructions  [facility]  [address] [place], [place] [address]   Phone: [phone]  Fax: [phone]    ALVIE FOWLES October 26, 1950 MRN: [id number]   _  _   ORAL DIABETIC MEDICATION INSTRUCTIONS  The day before your procedure:   Take your diabetic pill as you do normally  The day of your procedure:   Do not take your diabetic pill    We will check your blood sugar levels during the admission process and again in Recovery before discharging you home  ________________________________________________________________________  X  INSULIN (LONG ACTING) MEDICATION INSTRUCTIONS (Lantus, NPH, 70/30, Humulin, Novolin-N)   The day before your procedure:   Take  your regular evening dose    The day of your procedure:   Do not take your morning dose    _  _   INSULIN (SHORT ACTING) MEDICATION INSTRUCTIONS (Regular, Humulog, Novolog)   The day before your procedure:   Do not take your evening dose   The day of your procedure:   Do not take your morning dose   _  _   INSULIN PUMP MEDICATION INSTRUCTIONS  We will contact the physician managing your diabetic care for written dosage instructions for the day before your procedure and the day of your procedure.  Once we have received the instructions, we will contact you.

## 2010-09-28 NOTE — Assessment & Plan Note (Signed)
Summary: BACK PAIN/CONSPITATION/RBH   Vital Signs:  Patient profile:   60 year old male Weight:      303.04 pounds BMI:     45.57 Temp:     98.3 degrees F oral Pulse rate:   92 / minute Pulse rhythm:   regular BP sitting:   158 / 72  (left arm) Cuff size:   large  Vitals Entered By: Linde Gillis CMA Duncan Dull) (August 10, 2009 11:32 AM) CC: back pain, constipation x 4 days   History of Present Illness: Pt here for difficulty going to the bathroom. He buys stuff to drink forr constipation , the name of which he does not know. He has seen Dr Margot Chimes in prep for exam next month. He has increased his fluid intake at Dr Albertina Senegal request. He has some lower right flank pain since he has been constipated. He has felt constipated since his prior rectal exam. He has gained 30 pounds recently w/o0 changing his dietary habits. He denies fever or chills, blood in urine. He hasn't had a bowel movement in three days.  Problems Prior to Update: 1)  Gastritis  (ICD-535.50) 2)  Screening, Colon Cancer  (ICD-V76.51) 3)  Chest Pain  (ICD-786.50) 4)  Early Satiety  (ICD-780.94) 5)  Abdominal Pain Right Upper Quadrant  (ICD-789.01) 6)  Abdominal Pain, Right Upper Quadrant  (ICD-789.01) 7)  Achilles Tendinitis, Bilateral  (ICD-726.71) 8)  Abdominal Pain, Right Upper Quadrant  (ICD-789.01) 9)  Healthy Adult Male  (ICD-V70.0) 10)  Special Screening For Malignant Neoplasms Colon  (ICD-V76.51) 11)  Special Screening Malignant Neoplasm of Prostate  (ICD-V76.44) 12)  Depressive Disorder  (ICD-311) 13)  Erectile Dysfunction  (ICD-302.72) 14)  Obesity  (ICD-278.00) 15)  Family History Diabetes 1st Degree Relative  (ICD-V18.0) 16)  Family History of Cad Male 1st Degree Relative <50  (ICD-V17.3) 17)  Osteoarthritis  (ICD-715.90) 18)  Hypertension  (ICD-401.9) 19)  Hyperlipidemia  (ICD-272.4) 20)  Diabetes Mellitus, Type II  (ICD-250.00) 21)  Coronary Artery Disease  (ICD-414.00) 22)  Hx of Renal Calculus, Hx  of  (ICD-V13.01)  Medications Prior to Update: 1)  Humulin 70/30 70-30 % Susp (Insulin Isophane & Regular) .... 70 Units Before Breakfast and 70 Units Before Supper 2)  Fluoxetine Hcl 40 Mg Caps (Fluoxetine Hcl) .Marland Kitchen.. 1 Tab By Mouth Daily 3)  Nexium 40 Mg Cpdr (Esomeprazole Magnesium) .... One Tablet By Mouth Twice Daily 30 Min Prior To Breakfast and Dinner 4)  Lisinopril 20 Mg Tabs (Lisinopril) .... Take 1 Tablet By Mouth Once A Day 5)  Mens Multivitamin Plus  Tabs (Multiple Vitamins-Minerals) .... Once Daily 6)  Fenofibrate 160 Mg Tabs (Fenofibrate) .Marland Kitchen.. 1 Tab By Mouth Daily 7)  Miralax   Powd (Polyethylene Glycol 3350) .... As Per Prep  Instructions. 8)  Reglan 10 Mg  Tabs (Metoclopramide Hcl) .... As Per Prep Instructions. 9)  Dulcolax 5 Mg  Tbec (Bisacodyl) .... Day Before Procedure Take 2 At 3pm and 2 At 8pm.  Allergies: 1)  ! Lipitor (Atorvastatin Calcium)  Physical Exam  General:  obese appearing male in mild distrerss from abd discomfort and right upeer flank pain. Head:  Normocephalic and atraumatic without obvious abnormalities. No apparent alopecia or balding. Eyes:  No corneal or conjunctival inflammation noted. EOMI. Perrla. Funduscopic exam benign, without hemorrhages, exudates or papilledema. Vision grossly normal. Ears:  External ear exam shows no significant lesions or deformities.  Otoscopic examination reveals clear canals, tympanic membranes are intact bilaterally without bulging, retraction, inflammation or discharge.  Hearing is grossly normal bilaterally. Nose:  External nasal examination shows no deformity or inflammation. Nasal mucosa are pink and moist without lesions or exudates. Mouth:  Oral mucosa and oropharynx without lesions or exudates.  Teeth in good repair. Neck:  No deformities, masses, or tenderness noted. Chest Wall:  No deformities, masses, tenderness or gynecomastia noted. Lungs:  Normal respiratory effort, chest expands symmetrically. Lungs are clear  to auscultation, no crackles or wheezes. Heart:  Normal rate and regular rhythm. S1 and S2 normal without gallop, murmur, click, rub or other extra sounds. Abdomen:  Bowel sounds positive,abdomen soft and non-tender without masses, organomegaly or hernias noted. Very protuberrant, minimal percussion gas in right and left upper quadrants. Rectal:  No external abnormalities noted. Normal sphincter tone. No rectal masses or tenderness. SIgnificant firm but not hard BM in rectal vault, brown and Gneg.   Impression & Recommendations:  Problem # 1:  CONSTIPATION (ICD-564.00) Assessment New  See instructions. His updated medication list for this problem includes:    Miralax Powd (Polyethylene glycol 3350) .Marland Kitchen... As per prep  instructions.    Dulcolax 5 Mg Tbec (Bisacodyl) .Marland Kitchen... Day before procedure take 2 at 3pm and 2 at 8pm.  Discussed dietary fiber measures and increased water intake.   Complete Medication List: 1)  Humulin 70/30 70-30 % Susp (Insulin isophane & regular) .... 70 units before breakfast and 70 units before supper 2)  Fluoxetine Hcl 40 Mg Caps (Fluoxetine hcl) .Marland Kitchen.. 1 tab by mouth daily 3)  Nexium 40 Mg Cpdr (Esomeprazole magnesium) .... One tablet by mouth twice daily 30 min prior to breakfast and dinner 4)  Lisinopril 20 Mg Tabs (Lisinopril) .... Take 1 tablet by mouth once a day 5)  Mens Multivitamin Plus Tabs (Multiple vitamins-minerals) .... Once daily 6)  Fenofibrate 160 Mg Tabs (Fenofibrate) .Marland Kitchen.. 1 tab by mouth daily 7)  Miralax Powd (Polyethylene glycol 3350) .... As per prep  instructions. 8)  Reglan 10 Mg Tabs (Metoclopramide hcl) .... As per prep instructions. 9)  Dulcolax 5 Mg Tbec (Bisacodyl) .... Day before procedure take 2 at 3pm and 2 at 8pm.  Patient Instructions: 1)  Start Citrucel one tsp by mouth i 8 oz water. 2)  Walk regularly. 3)  Use MOM 2 tbsp (30 cc) now and repeat in 4 hrs if no BM.  4)  Use OTC enema 45 mins after first dose of MOM. (Milk of  Magnesia) 5)  May repeat next day, same schedule.  6)  RTC Thu with Dr Ermalene Searing.  Current Allergies (reviewed today): ! LIPITOR (ATORVASTATIN CALCIUM) Updated/Current Medications (including changes made in today's visit):  HUMULIN 70/30 70-30 % SUSP (INSULIN ISOPHANE & REGULAR) 70 units before breakfast and 70 units before supper FLUOXETINE HCL 40 MG CAPS (FLUOXETINE HCL) 1 tab by mouth daily NEXIUM 40 MG CPDR (ESOMEPRAZOLE MAGNESIUM) one tablet by mouth twice daily 30 min prior to breakfast and dinner LISINOPRIL 20 MG TABS (LISINOPRIL) Take 1 tablet by mouth once a day MENS MULTIVITAMIN PLUS  TABS (MULTIPLE VITAMINS-MINERALS) once daily FENOFIBRATE 160 MG TABS (FENOFIBRATE) 1 tab by mouth daily MIRALAX   POWD (POLYETHYLENE GLYCOL 3350) As per prep  instructions. REGLAN 10 MG  TABS (METOCLOPRAMIDE HCL) As per prep instructions. DULCOLAX 5 MG  TBEC (BISACODYL) Day before procedure take 2 at 3pm and 2 at 8pm.

## 2010-09-28 NOTE — Miscellaneous (Signed)
  Clinical Lists Changes  Medications: Changed medication from HUMULIN 70/30 70-30 %  SUSP (INSULIN ISOPHANE & REGULAR) 15 units two times a day to HUMULIN 70/30 70-30 %  SUSP (INSULIN ISOPHANE & REGULAR) 20  units two times a day

## 2010-09-28 NOTE — Progress Notes (Signed)
Summary: Prep not at pharm  Phone Note Call from Patient Call back at Home Phone [phone]   Caller: Spouse Summary of Call: Miralax prep not at pharmacy.  Pt was at pharm this AM.  Please re-send.  [facility] [address]  Initial call taken by: Francee Piccolo CMA Duncan Dull),  August 05, 2009 11:12 AM  Follow-up for Phone Call        Rx resent to patient's pharmacy. Patient advised. Follow-up by: Hortense Ramal CMA (AAMA),  August 05, 2009 11:15 AM    Prescriptions: MIRALAX   POWD (POLYETHYLENE GLYCOL 3350) As per prep  instructions.  #255 grams x 0   Entered by:   Hortense Ramal CMA (AAMA)   Authorized by:   Hart Carwin MD   Signed by:   Hortense Ramal CMA (AAMA) on 08/05/2009   Method used:   Electronically to        [facility] [address] (retail)       [address], [address]       [place], [place]  [address]       Ph: [phone]       Fax: [phone]   RxID:   [id number] REGLAN 10 MG  TABS (METOCLOPRAMIDE HCL) As per prep instructions.  #2 x 0   Entered by:   Hortense Ramal CMA (AAMA)   Authorized by:   Hart Carwin MD   Signed by:   Hortense Ramal CMA (AAMA) on 08/05/2009   Method used:   Electronically to        [facility] [address] (retail)       [address]       [name], [place]  [address]       Ph: [phone]       Fax: [phone]   RxID:   [id number] DULCOLAX 5 MG  TBEC (BISACODYL) Day before procedure take 2 at 3pm and 2 at 8pm.  #4 x 0   Entered by:   Hortense Ramal CMA (AAMA)   Authorized by:   Hart Carwin MD   Signed by:   Hortense Ramal CMA (AAMA) on 08/05/2009   Method used:   Electronically to        [facility] [address] (retail)       [address], [address]       [place], [place]  [address]       Ph: [phone]       Fax: [phone]   RxID:   [id number]

## 2010-09-28 NOTE — Miscellaneous (Signed)
Summary: Orders Update  Clinical Lists Changes  Orders: Added new Test order of Arterial Duplex Lower Extremity (Arterial Duplex Low) - Signed 

## 2010-09-28 NOTE — Miscellaneous (Signed)
Summary: Orders Update   Clinical Lists Changes  Orders: Added new Service order of Est. Patient Level IV (99214) - Signed 

## 2010-09-28 NOTE — Miscellaneous (Signed)
  Clinical Lists Changes  Observations: Added new observation of PNEUMOVAX: Pneumovax (06/30/2007 9:44) Added new observation of FLU VAX: Fluvax 3+ (06/30/2007 9:44)       Influenza Immunization History:    Influenza # 1:  Fluvax 3+ (06/30/2007)  Pneumovax Immunization History:    Pneumovax # 1:  Pneumovax (06/30/2007)

## 2010-09-28 NOTE — Letter (Signed)
Summary: [facility] Instructions  [facility]  [address] [place], [place] [address]   Phone: [phone]  Fax: [phone]       [name]    Jan 05, 1951    MRN: [id number]       Procedure Day /Date: 09/03/09 (Thursday)     Arrival Time: 10:00 am     Procedure Time: 11:00 am     Location of Procedure:                    _x_  [facility] (4th Floor)  PREPARATION FOR COLONOSCOPY WITH MIRALAX  Starting 5 days prior to your procedure 08/28/09 do not eat nuts, seeds, popcorn, corn, beans, peas,  salads, or any raw vegetables.  Do not take any fiber supplements (e.g. Metamucil, Citrucel, and Benefiber). ____________________________________________________________________________________________________   THE DAY BEFORE YOUR PROCEDURE         DATE: 09/02/08 DAY: Wednesday 1   Drink clear liquids the entire day-NO SOLID FOOD  2   Do not drink anything colored red or purple.  Avoid juices with pulp.  No orange juice.  3   Drink at least 64 oz. (8 glasses) of fluid/clear liquids during the day to prevent dehydration and help the prep work efficiently.  CLEAR LIQUIDS INCLUDE: Water Jello Ice Popsicles Tea (sugar ok, no milk/cream) Powdered fruit flavored drinks Coffee (sugar ok, no milk/cream) Gatorade Juice: apple, white grape, white cranberry  Lemonade Clear bullion, consomm, broth Carbonated beverages (any kind) Strained chicken noodle soup Hard Candy  4   Mix the entire bottle of Miralax with 64 oz. of Gatorade/Powerade in the morning and put in the refrigerator to chill.  5   At 3:00 pm take 2 Dulcolax/Bisacodyl tablets.  6   At 4:30 pm take one Reglan/Metoclopramide tablet.  7  Starting at 5:00 pm drink one 8 oz glass of the Miralax mixture every 15-20 minutes until you have finished drinking the entire 64 oz.  You should finish drinking prep around 7:30 or 8:00 pm.  8   If you are nauseated, you may take the 2nd Reglan/Metoclopramide tablet  at 6:30 pm.        9    At 8:00 pm take 2 more DULCOLAX/Bisacodyl tablets.         THE DAY OF YOUR PROCEDURE      DATE:  09/03/09 DAY: Thursday  You may drink clear liquids until 9:00 am  (2 HOURS BEFORE PROCEDURE).   MEDICATION INSTRUCTIONS  Unless otherwise instructed, you should take regular prescription medications with a small sip of water as early as possible the morning of your procedure.  Diabetic patients - see separate instructions.         OTHER INSTRUCTIONS  You will need a responsible adult at least 61 years of age to accompany you and drive you home.   This person must remain in the waiting room during your procedure.  Wear loose fitting clothing that is easily removed.  Leave jewelry and other valuables at home.  However, you may wish to bring a book to read or an iPod/MP3 player to listen to music as you wait for your procedure to start.  Remove all body piercing jewelry and leave at home.  Total time from sign-in until discharge is approximately 2-3 hours.  You should go home directly after your procedure and rest.  You can resume normal activities the day after your procedure.  The day of your procedure you should  not:   Drive   Make legal decisions   Operate machinery   Drink alcohol   Return to work  You will receive specific instructions about eating, activities and medications before you leave.   The above instructions have been reviewed and explained to me by   Hortense Ramal, CMA    I fully understand and can verbalize these instructions _____________________________ Date 07/31/09

## 2010-09-28 NOTE — Assessment & Plan Note (Signed)
Summary: PRE OP CLEARANCE/CLE   Vital Signs:  Patient Profile:   60 Years Old Male Height:     68.5 inches Weight:      284.13 pounds Temp:     98 degrees F oral Pulse rate:   76 / minute Pulse rhythm:   regular BP sitting:   176 / 84  (left arm) Cuff size:   large  Vitals Entered By: Delilah Shan (November 22, 2007 12:29 PM)                 Chief Complaint:  Pre-op clearance -( that form was faxed this a.m. to his Urologist/lsf).  History of Present Illness: Scheduled for adult circumsicion for phimois by urologist.  Day surgery at [facility] long.  BP  elevated he feels because he is very nervous about surgery BP has been elevated at home even on higher dose of enalapril 40 mg daily He thought he was to stop coreg, so has not taken this in a while  Cardiac cath : no significant CAD  DM, poor control: CBGs, fasting 130-140, later in day 200 post prandial depending on what he eats Not exercising much.  He feels like blood sugar i    Current Allergies (reviewed today): ! LIPITOR (ATORVASTATIN CALCIUM)  Past Medical History:    Reviewed history from 12/05/2006 and no changes required:       Coronary artery disease       Diabetes mellitus, type II       Hyperlipidemia       Hypertension       Osteoarthritis     Review of Systems  General      Denies fatigue.  CV      Denies chest pain or discomfort.  Resp      Denies shortness of breath.  GI      Denies abdominal pain and bloody stools.  GU      Denies dysuria.   Physical Exam  General:     fatigued appearing , NAD, overweight Neck:     no carotid bruit Lungs:     Normal respiratory effort, chest expands symmetrically. Lungs are clear to auscultation, no crackles or wheezes. Heart:     Normal rate and regular rhythm. S1 and S2 normal without gallop, murmur, click, rub or other extra sounds. Abdomen:     Bowel sounds positive,abdomen soft and non-tender without masses, organomegaly or hernias  noted. Pulses:     R and L posterior tibial pulses are full and equal bilaterally  Extremities:     no edema Neurologic:     No cranial nerve deficits noted. Station and gait are normal. Sensory, motor and coordinative functions appear intact.    Impression & Recommendations:  Problem # 1:  PREOPERATIVE EXAMINATION (ICD-V72.84) Cleared for minor elevtive suregery.  Given obesity and sleep apnea recommend IS after surgery. Early mobilization.  Follow blood sugars closely.  HTN control as below.  Problem # 2:  HYPERTENSION (ICD-401.9) poor control. Add new med amlodipine. Discussed SE and benefits.  Follow up at scheduled DM check.   His updated medication list for this problem includes:    Enalapril Maleate 20 Mg Tabs (Enalapril maleate) .Marland Kitchen... 2 tab by mouth daily    Coreg 6.25 Mg Tabs (Carvedilol) .Marland Kitchen... Take 1 tablet by mouth two times a day    Amlodipine Besylate 5 Mg Tabs (Amlodipine besylate) .Marland Kitchen... Take 1 tablet by mouth once a day  BP today: 176/84 Prior BP: 142/90 (10/09/2007)  Prior 10 Yr Risk Heart Disease: N/A (04/10/2007)  Labs Reviewed: Creat: 1.1 (10/17/2007) Chol: 235 (04/11/2007)   HDL: 29.4 (04/11/2007)   LDL: DEL (04/11/2007)   TG: 234 (04/11/2007)   Complete Medication List: 1)  Advil 200 Mg Caps (Ibuprofen) .... As needed 2)  Mens Multivitamin Plus Tabs (Multiple vitamins-minerals) .... Once daily 3)  Enalapril Maleate 20 Mg Tabs (Enalapril maleate) .... 2 tab by mouth daily 4)  Glipizide 10 Mg Tabs (Glipizide) .... Take 1 tablet by mouth two times a day 5)  Coreg 6.25 Mg Tabs (Carvedilol) .... Take 1 tablet by mouth two times a day 6)  Humulin 70/30 70-30 % Susp (Insulin isophane & regular) .... 20  units two times a day 7)  Fluoxetine Hcl 10 Mg Caps (Fluoxetine hcl) .... Take 1 tablet by mouth once a day 8)  Amlodipine Besylate 5 Mg Tabs (Amlodipine besylate) .... Take 1 tablet by mouth once a day Patient did not bring medications or an updated medication  list.  The above list is what is currently on the chart.   Patient Instructions: 1)  Start new BP med: amlodipine 2)  Keep appt for DM in [place]. 3)  BMP prior to visit, ICD-9: DX 250.00 ALL 4)  Hepatic Panel prior to visit, ICD-9: 5)  Lipid Panel prior to visit, ICD-9: 6)  HbgA1C prior to visit, ICD-9:    Prescriptions: AMLODIPINE BESYLATE 5 MG  TABS (AMLODIPINE BESYLATE) Take 1 tablet by mouth once a day  #30 x 11   Entered and Authorized by:   Kerby Nora MD   Signed by:   Kerby Nora MD on 11/22/2007   Method used:   Electronically sent to ...       [facility]*       [address]       [place], [place]  [address]       Ph: [phone] or [phone]       Fax: [phone]   RxID:   [id number] COREG 6.25 MG  TABS (CARVEDILOL) Take 1 tablet by mouth two times a day  #60 x 11   Entered and Authorized by:   Kerby Nora MD   Signed by:   Kerby Nora MD on 11/22/2007   Method used:   Electronically sent to ...       [facility]*       [address]       [place], [place]  [address]       Ph: [phone] or [phone]       Fax: [phone]   RxID:   [id number]  ] Current Allergies (reviewed today): ! LIPITOR (ATORVASTATIN CALCIUM)

## 2010-09-28 NOTE — Assessment & Plan Note (Signed)
Summary: 3 MONTH FOLLOW UP/[facility]   Vital Signs:  Patient Profile:   60 Years Old Male Height:     68.5 inches Weight:      265.25 pounds Temp:     98.3 degrees F oral Pulse rate:   76 / minute Pulse rhythm:   regular BP sitting:   142 / 76  (left arm) Cuff size:   large  Vitals Entered By: Delilah Shan (April 14, 2008 8:25 AM)                 Chief Complaint:  3 months follow up.  History of Present Illness: No further chest pain since on protonix x  2 weeks  Diabetes Management History:      He is (or has been) enrolled in the "Diabetic Education Program".  He states understanding of dietary principles but he is not following the appropriate diet.  No sensory loss is reported.  Self foot exams are not being performed.  He is not checking home blood sugars.  He says that he is not exercising regularly.        Hypoglycemic symptoms are not occurring.  No hyperglycemic symptoms are reported.        There are no symptoms to suggest diabetic complications.  Since his last visit, no infections have occurred.  The following treatment plan problems are reported: acceptance or denial issues.        His home fasting blood sugars are as follows: highest: 220; lowest: 180.    Hypertension History:      He denies headache, chest pain, palpitations, dyspnea with exertion, orthopnea, PND, peripheral edema, visual symptoms, neurologic problems, syncope, and side effects from treatment.        Positive major cardiovascular risk factors include male age 48 years old or older, diabetes, hyperlipidemia, and hypertension.  Negative major cardiovascular risk factors include non-tobacco-user status.        Positive history for target organ damage include ASHD (either angina; prior MI; prior CABG).    Lipid Management History:      Positive NCEP/ATP III risk factors include male age 6 years old or older, diabetes, HDL cholesterol less than 40, hypertension, and ASHD (atherosclerotic heart disease).   Negative NCEP/ATP III risk factors include non-tobacco-user status.         Current Allergies (reviewed today): ! LIPITOR (ATORVASTATIN CALCIUM)  Past Medical History:    Reviewed history from 12/05/2006 and no changes required:       Coronary artery disease       Diabetes mellitus, type II       Hyperlipidemia       Hypertension       Osteoarthritis   Social History:    Reviewed history from 03/26/2007 and no changes required:       disabled because of back injury       finacial concerns       wife with depression       daughter died 9 months ago       Married       Never Smoked       Alcohol use-no       Drug use-no       Regular exercise-yes           Review of Systems      See HPI  Resp      Denies shortness of breath.  GI      Denies abdominal pain.  GU  Complains of erectile dysfunction.  MS      Denies joint pain, joint redness, and joint swelling.   Physical Exam  General:     alert, well-developed, well-nourished, and well-hydrated.  morbidly obese   Neck:     no carotid bruit or thyromegaly  Chest Wall:     No deformities, masses, tenderness or gynecomastia noted. Lungs:     Normal respiratory effort, chest expands symmetrically. Lungs are clear to auscultation, no crackles or wheezes. Heart:     Normal rate and regular rhythm. S1 and S2 normal without gallop, murmur, click, rub or other extra sounds. Abdomen:     Bowel sounds positive,abdomen soft and non-tender without masses, organomegaly or hernias noted. Pulses:     R and L posterior tibial pulses are full and equal bilaterally  Extremities:     no edema  Diabetes Management Exam:    Foot Exam (with socks and/or shoes not present):       Sensory-Pinprick/Light touch:          Left medial foot (L-4): normal          Left dorsal foot (L-5): normal          Left lateral foot (S-1): normal          Right medial foot (L-4): normal          Right dorsal foot (L-5): normal           Right lateral foot (S-1): normal       Sensory-Monofilament:          Left foot: normal          Right foot: normal       Inspection:          Left foot: normal          Right foot: normal       Nails:          Left foot: normal          Right foot: normal    Impression & Recommendations:  Problem # 1:  DIABETES MELLITUS, TYPE II (ICD-250.00) Very poor control. He admits to complete noncompliance with diet, he will work on this. Increase insulin to 40 Units two times a day.  His updated medication list for this problem includes:    Enalapril Maleate 20 Mg Tabs (Enalapril maleate) .Marland Kitchen... 2 tab by mouth daily    Humulin 70/30 70-30 % Susp (Insulin isophane & regular) .Marland KitchenMarland KitchenMarland KitchenMarland Kitchen 40 units injected twice daily   Problem # 2:  HYPERTENSION (ICD-401.9) Not quite at goal. Encouraged exercise, weight loss, healthy eating habits.   Increase medicine if not at goal at next visit.  His updated medication list for this problem includes:    Enalapril Maleate 20 Mg Tabs (Enalapril maleate) .Marland Kitchen... 2 tab by mouth daily    Coreg 6.25 Mg Tabs (Carvedilol) .Marland Kitchen... Take 1 tablet by mouth two times a day    Amlodipine Besylate 5 Mg Tabs (Amlodipine besylate) .Marland Kitchen... Take 1 tablet by mouth once a day   Problem # 3:  HYPERLIPIDEMIA (ICD-272.4) Dramatically better on pravastatin and with diet changes he made because of recent GERD.  Will increase pravastatin to 80 mg daily.  His updated medication list for this problem includes:    Pravastatin Sodium 40 Mg Tabs (Pravastatin sodium) .Marland Kitchen... Take 2 tablet by mouth once a day   Complete Medication List: 1)  Advil 200 Mg Caps (Ibuprofen) .... As needed 2)  Mens Multivitamin  Plus Tabs (Multiple vitamins-minerals) .... Once daily 3)  Enalapril Maleate 20 Mg Tabs (Enalapril maleate) .... 2 tab by mouth daily 4)  Coreg 6.25 Mg Tabs (Carvedilol) .... Take 1 tablet by mouth two times a day 5)  Humulin 70/30 70-30 % Susp (Insulin isophane & regular) .... 40 units injected  twice daily 6)  Amlodipine Besylate 5 Mg Tabs (Amlodipine besylate) .... Take 1 tablet by mouth once a day 7)  Ventolin Hfa 108 (90 Base) Mcg/act Aers (Albuterol sulfate) .Marland Kitchen.. 1-2 puffs q 4h as needed chest tightness of wheezing 8)  Pravastatin Sodium 40 Mg Tabs (Pravastatin sodium) .... Take 2 tablet by mouth once a day 9)  Protonix 40 Mg Pack (Pantoprazole sodium) .Marland Kitchen.. 1 tab by mouth daily  Diabetes Management Assessment/Plan:      The following lipid goals have been established for the patient: Total cholesterol goal of 200; LDL cholesterol goal of 70; HDL cholesterol goal of 40; Triglyceride goal of 150.  His blood pressure goal is < 130/80.    Current Insulin Use:    Longer Acting Insulin:       Type:  Humulin 70/30       Dosage:  15 units at breakfast; 15 units at bedtime.  Hypertension Assessment/Plan:      [name] hypertensive risk group is category C: Target organ damage and/or diabetes.  Today's blood pressure is 142/76.  His blood pressure goal is < 130/80.  Lipid Assessment/Plan:      Based on NCEP/ATP III, the patient's risk factor category is "history of coronary disease, peripheral vascular disease, cerebrovascular disease, or aortic aneurysm along with either diabetes, current smoker, or LDL > 130 plus HDL < 40 plus triglycerides > 200".  From this information, the patient's calculated lipid goals are as follows: Total cholesterol goal is 200; LDL cholesterol goal is 70; HDL cholesterol goal is 40; Triglyceride goal is 150.  His LDL cholesterol goal has not been met.     Patient Instructions: 1)  Increase insulin to 40 units twice daily. 2)  Increase pravastatin cholesterol medicine to 2 TABLETS once a day. 3)  Please schedule a CPE in 3 months. 4)  BMP prior to visit, ICD-9: 250.00 ALL 5)  Hepatic Panel prior to visit, ICD-9: 6)  Lipid Panel prior to visit, ICD-9: 7)  HbgA1C prior to visit, ICD-9: 8)  PSA prior to visit, ICD-9: v76.44 9)  Urine Microalbumin prior  to visit, ICD-9:   Prescriptions: PRAVASTATIN SODIUM 40 MG  TABS (PRAVASTATIN SODIUM) Take 2 tablet by mouth once a day  #60 x 5   Entered and Authorized by:   Kerby Nora MD   Signed by:   Kerby Nora MD on 04/14/2008   Method used:   Electronically sent to ...       [facility] [address]       [address]       [place], [place]  [address]       Ph: [phone]       Fax: [phone]   RxID:   [id number] HUMULIN 70/30 70-30 %  SUSP (INSULIN ISOPHANE & REGULAR) 40 Units injected twice daily  #3 bottle x 5   Entered and Authorized by:   Kerby Nora MD   Signed by:   Kerby Nora MD on 04/14/2008   Method used:   Electronically sent to ...       [facility]  [phone]  [address]       [address], [address]       [place], [place]  [address]       Ph: [phone]       Fax: [phone]   RxID:   [id number]  ] Current Allergies (reviewed today): ! LIPITOR (ATORVASTATIN CALCIUM) Current Medications (including changes made in today's visit):  ADVIL 200 MG  CAPS (IBUPROFEN) as needed MENS MULTIVITAMIN PLUS   TABS (MULTIPLE VITAMINS-MINERALS) once daily ENALAPRIL MALEATE 20 MG  TABS (ENALAPRIL MALEATE) 2 tab by mouth daily COREG 6.25 MG  TABS (CARVEDILOL) Take 1 tablet by mouth two times a day HUMULIN 70/30 70-30 %  SUSP (INSULIN ISOPHANE & REGULAR) 40 Units injected twice daily AMLODIPINE BESYLATE 5 MG  TABS (AMLODIPINE BESYLATE) Take 1 tablet by mouth once a day VENTOLIN HFA 108 (90 BASE) MCG/ACT  AERS (ALBUTEROL SULFATE) 1-2 puffs q 4h as needed chest tightness of wheezing PRAVASTATIN SODIUM 40 MG  TABS (PRAVASTATIN SODIUM) Take 2 tablet by mouth once a day PROTONIX 40 MG  PACK (PANTOPRAZOLE SODIUM) 1 tab by mouth daily

## 2010-09-28 NOTE — Assessment & Plan Note (Signed)
Summary: CONSULT BEFORE COL PT INSULIN DEP.Marland KitchenEM   History of Present Illness Visit Type: Initial Consult Primary GI MD: Lina Sar MD Primary Provider: Kerby Nora, MD Requesting Provider: Kerby Nora, MD Chief Complaint: Patient states that there may be 3 to 4 days between bowel movements,bloating, colon screening: patient on insulin History of Present Illness:   This is a 60 year old white male with  change in his  bowel habits. He  is here to discuss having a colonoscopy. He had a recent upper endoscopy for gastroesophageal reflux His symptoms are  completely controlled on Nexium 40 mg daily. He has never had a colonoscopy and there is no family history of colon cancer. His hemoglobin is 13.4, hematocrit is 39.1. He is an insulin-dependent diabetic. The constipation started after he was put on an increased dose of Prozac from 20 to 40 mg a day and and increased dose of lisinopril.   GI Review of Systems      Denies abdominal pain, acid reflux, belching, bloating, chest pain, dysphagia with liquids, dysphagia with solids, heartburn, loss of appetite, nausea, vomiting, vomiting blood, weight loss, and  weight gain.      Reports change in bowel habits, constipation, and  diarrhea.     Denies anal fissure, black tarry stools, diverticulosis, fecal incontinence, heme positive stool, hemorrhoids, irritable bowel syndrome, jaundice, light color stool, liver problems, rectal bleeding, and  rectal pain.    Current Medications (verified): 1)  Humulin 70/30 70-30 % Susp (Insulin Isophane & Regular) .... 70 Units Before Breakfast and 70 Units Before Supper 2)  Fluoxetine Hcl 40 Mg Caps (Fluoxetine Hcl) .Marland Kitchen.. 1 Tab By Mouth Daily 3)  Nexium 40 Mg Cpdr (Esomeprazole Magnesium) .... One Tablet By Mouth Twice Daily 30 Min Prior To Breakfast and Dinner 4)  Lisinopril 20 Mg Tabs (Lisinopril) .... Take 1 Tablet By Mouth Once A Day 5)  Mens Multivitamin Plus  Tabs (Multiple Vitamins-Minerals) .... Once  Daily 6)  Fenofibrate 160 Mg Tabs (Fenofibrate) .Marland Kitchen.. 1 Tab By Mouth Daily  Allergies (verified): 1)  ! Lipitor (Atorvastatin Calcium)  Past History:  Past Medical History: Reviewed history from 06/19/2009 and no changes required. Coronary artery disease/MILD. Diabetes mellitus, type II/INSULIN DEPENDENT Hyperlipidemia Hypertension Osteoarthritis Depression Kidney Stones Obesity  Past Surgical History: Reviewed history from 07/29/2009 and no changes required. 09/2000: Cardiac Cath  diffuse LAD, 30 % LCA  EF 50-65% 02/2002 back injury: workers comp 11/08 cath:no significant CAD  Unremarkable  Family History: Reviewed history from 07/29/2009 and no changes required. father died age 41 Dm, MI mother age 61 Alzheimer's, emphesema Family History of CAD Male 1st degree relative <50 Family History Diabetes 1st degree relative:Father brother ? neck cancer No FH of Colon Cancer:  Social History: Reviewed history from 03/26/2007 and no changes required. disabled because of back injury finacial concerns wife with depression daughter died 56 months ago Married Never Smoked Alcohol use-no Drug use-no Regular exercise-yes  Review of Systems       The patient complains of back pain, cough, depression-new, fatigue, headaches-new, muscle pains/cramps, shortness of breath, and swelling of feet/legs.         Pertinent positive and negative review of systems were noted in the above HPI. All other ROS was otherwise negative.   Vital Signs:  Patient profile:   60 year old male Height:      68.5 inches Weight:      302 pounds BMI:     45.41 Pulse rate:   68 / minute  Pulse rhythm:   regular BP sitting:   148 / 72  (left arm) Cuff size:   large  Vitals Entered By: June McMurray CMA Duncan Dull) (July 31, 2009 3:19 PM)   Impression & Recommendations:  Problem # 1:  GASTRITIS (ICD-535.50) controlled on Nexium 40 mg daily.  Problem # 2:  SCREENING, COLON CANCER  (ICD-V76.51) Patient is due for a screening colonoscopy. I have given him a brochure on constipation and have scheduled him for a colonoscopy with  MiraLax prep. He will adjust his insulin dose prior to the procedure.  Other Orders: Colonoscopy (Colon)  Patient Instructions: 1)  screening colonoscopy., adjust Insulin dose as per protocol 2)  Booklet on constipation. 3)  Copy sent to : Dr Maryellen Pile Prescriptions: DULCOLAX 5 MG  TBEC (BISACODYL) Day before procedure take 2 at 3pm and 2 at 8pm.  #4 x 0   Entered and Authorized by:   Hart Carwin MD   Signed by:   Hart Carwin MD on 08/01/2009   Method used:   Historical   RxID:   [id number] REGLAN 10 MG  TABS (METOCLOPRAMIDE HCL) As per prep instructions.  #2 x 0   Entered by:   Hortense Ramal CMA (AAMA)   Authorized by:   Hart Carwin MD   Signed by:   Hart Carwin MD on 08/01/2009   Method used:   Historical   RxID:   [id number] MIRALAX   POWD (POLYETHYLENE GLYCOL 3350) As per prep  instructions.  #255gm x 0   Entered and Authorized by:   Hart Carwin MD   Signed by:   Hart Carwin MD on 08/01/2009   Method used:   Historical   RxID:   [id number]

## 2010-09-28 NOTE — Medication Information (Signed)
Summary: [employer]/Formulary Exception Request Form/Signed an  [employer]/Formulary Exception Request Form/Signed and Faxed   Imported By: Mickle Asper 03/25/2008 15:22:59  _____________________________________________________________________  External Attachment:    Type:   Image     Comment:   External Document

## 2010-09-28 NOTE — Procedures (Signed)
Summary: Upper Endoscopy  Patient: [name] Note: All result statuses are Final unless otherwise noted.  Tests: (1) Upper Endoscopy (EGD)   EGD Upper Endoscopy       DONE (C)     [facility]     [address]     [place], [place]  [address]           OPERATIVE PROCEDURE REPORT           PATIENT:  [name], [name]  MR#:  [id number]     BIRTHDATE:  01/14/1951  GENDER:  male           ENDOSCOPIST:  Hedwig Morton. Juanda Chance, MD     ASSISTANT:           PROCEDURE DATE:  06/25/2009     PRE-PROCEDURE PREPERATION:     PRE-PROCEDURE PHYSICAL:  Amy Michelle Nasuti, MD     PROCEDURE:  EGD with biopsy     ASA CLASS:  Class II     INDICATIONS:  1) chest pain  2) abdominal pain RUQ abd. pain,     negative cardial work up, normal sono of the abdomen, nl HIDA with     CCK,     obesity, depression           MEDICATIONS:  Versed 6 mg, Fentanyl 50 mcg     TOPICAL ANESTHETIC:  Exactacain Spray           DESCRIPTION OF PROCEDURE:   After the risks benefits and     alternatives of the procedure were thoroughly explained, informed     consent was obtained.  The LB [id number] endoscope was     introduced through the mouth and advanced to the second portion of     the duodenum, without limitations.  The instrument was slowly     withdrawn as the mucosa was fully examined.     <<PROCEDUREIMAGES>>           Moderate gastritis was found. multiple angry appearing erosions in     the antrum, bopsies taken With standard forceps, a biopsy was     obtained and sent to pathology (see image3, image4, image6, and     image7).  Otherwise the examination was normal (see image9,     image8, image5, image2, and image1).    Retroflexed views revealed     no abnormalities.    The scope was then withdrawn from the patient     and the procedure terminated.           COMPLICATIONS:  None           IMPRESSION:  1) Moderate gastritis     2) Otherwise normal examination     r/o drug induced gastritis, r/o  H.Pylori, correlation with     abdominal pain is possible but not for sure     RECOMMENDATIONS:  1) await pathology results     Nexiem 40 mg po bid, stop all NSAID's,     Carafate 1gm po bid x 1 week, #14           REPEAT EXAM:  In 0 year(s) for.     DISCHARGE INSTRUCTIONS:           ______________________________     Hedwig Morton. Juanda Chance, MD           CPT CODES:           DIAGNOSIS CODES:  CC:           n.     REVISED:  06/25/2009 03:24 PM     eSIGNED:   Hedwig Morton. Brodie at 06/25/2009 03:24 PM           Page 2 of 3   Dayne, Chait, [phone]  Note: An exclamation mark (!) indicates a result that was not dispersed into the flowsheet. Document Creation Date: 06/25/2009 4:33 PM _______________________________________________________________________  (1) Order result status: Final Collection or observation date-time: 06/25/2009 11:38 Requested date-time:  Receipt date-time:  Reported date-time:  Referring Physician:   Ordering Physician: Lina Sar [phone]) Specimen Source:  Source: Launa Grill Order Number: [id number] Lab site:

## 2010-09-28 NOTE — Assessment & Plan Note (Signed)
Summary: F/U /CLE   Vital Signs:  Patient profile:   60 year old male Height:      68.5 inches Weight:      295.4 pounds BMI:     44.42 Temp:     98.2 degrees F oral Pulse rate:   76 / minute Pulse rhythm:   regular BP sitting:   140 / 88  (left arm) Cuff size:   large  Vitals Entered By: Benny Lennert CMA Duncan Dull) (June 02, 2009 3:54 PM)  History of Present Illness: Chief complaint [place]  Juanita Laster Seen for Chest Pain 9/21-9/22 Cardiac eval..negative.  Resolved.  Told to take protonix..cannot afford.   Since then pain right upper quadrant x 3 weeks...feels numb and swollen. Radiates around right side. Non tender to palpaltion, pain deep in.  Intermittant. Advil helps a little.  No change with food.  CT abdomen and pelvis negative.  No falls or injury.  Rolling spool recently...prior to pain starting.   Poor control of DM..."i just don't care any more" HAd to cut back insulin to 60 Units because cannot afford more. Feels fatigued all the time.  B ankles hurt..cannot walk because of pain ongoing x 1 year. Worse in last 6 months...cannot stretch back of heel. Swelling on elft insertion of achilles.    Problems Prior to Update: 1)  Abdominal Pain, Right Upper Quadrant  (ICD-789.01) 2)  Healthy Adult Male  (ICD-V70.0) 3)  Special Screening For Malignant Neoplasms Colon  (ICD-V76.51) 4)  Special Screening Malignant Neoplasm of Prostate  (ICD-V76.44) 5)  Depressive Disorder  (ICD-311) 6)  Erectile Dysfunction  (ICD-302.72) 7)  Obesity  (ICD-278.00) 8)  Family History Diabetes 1st Degree Relative  (ICD-V18.0) 9)  Family History of Cad Male 1st Degree Relative <50  (ICD-V17.3) 10)  Osteoarthritis  (ICD-715.90) 11)  Hypertension  (ICD-401.9) 12)  Hyperlipidemia  (ICD-272.4) 13)  Diabetes Mellitus, Type II  (ICD-250.00) 14)  Coronary Artery Disease  (ICD-414.00) 15)  Hx of Renal Calculus, Hx of  (ICD-V13.01)  Current Medications (verified): 1)  Advil 200  Mg  Caps (Ibuprofen) .... As Needed 2)  Mens Multivitamin Plus   Tabs (Multiple Vitamins-Minerals) .... Once Daily 3)  Coreg 6.25 Mg  Tabs (Carvedilol) .... Take 1 Tablet By Mouth Two Times A Day 4)  Ventolin Hfa 108 (90 Base) Mcg/act  Aers (Albuterol Sulfate) .Marland Kitchen.. 1-2 Puffs Q 4h As Needed Chest Tightness of Wheezing 5)  Novolog Mix 70/30 70-30 % Susp (Insulin Aspart Prot & Aspart) .... 70 Units Prior  To Breakfast,  70 Units Prior To Supper 6)  Fish Oil   Oil (Fish Oil) .... Takes 2 Per Day 7)  Pravastatin Sodium 40 Mg Tabs (Pravastatin Sodium) .... Take 1 Tablet By Mouth Once A Day 8)  Metformin Hcl 1000 Mg Tabs (Metformin Hcl) .... Take 1 Tablet By Mouth Two Times A Day 9)  Fluoxetine Hcl 20 Mg Caps (Fluoxetine Hcl) .Marland Kitchen.. 1 Tab By Daily 10)  Omeprazole 40 Mg Cpdr (Omeprazole) .Marland Kitchen.. 1 Tab By Mouth Daily 11)  Diclofenac Sodium 75 Mg Tbec (Diclofenac Sodium) .Marland Kitchen.. 1 Tab By Mouth Two Times A Day As Needed Pain in Ankles  Allergies: 1)  ! Lipitor (Atorvastatin Calcium)  Past History:  Past medical, surgical, family and social histories (including risk factors) reviewed, and no changes noted (except as noted below).  Past Medical History: Reviewed history from 12/05/2006 and no changes required. Coronary artery disease Diabetes mellitus, type II Hyperlipidemia Hypertension Osteoarthritis  Past Surgical  History: Reviewed history from 07/06/2007 and no changes required. 09/2000: Cardiac Cath  diffuse LAD, 30 % LCA  EF 50-65% 02/2002 back injury: workers comp 11/08 cath:no significant CAD  Family History: Reviewed history from 12/05/2006 and no changes required. father died age 36 Dm, MI mother age 59 Alzheimer's, emphesema Family History of CAD Male 1st degree relative <50 Family History Diabetes 1st degree relative brother ? neck cancer  Social History: Reviewed history from 03/26/2007 and no changes required. disabled because of back injury finacial concerns wife with  depression daughter died 25 months ago Married Never Smoked Alcohol use-no Drug use-no Regular exercise-yes  Review of Systems General:  Complains of fatigue. CV:  Complains of chest pain or discomfort. Resp:  Denies shortness of breath. GI:  Complains of abdominal pain; denies bloody stools, constipation, and diarrhea.  Physical Exam  General:  alert, well-developed, well-nourished, and well-hydrated.  morbidly obese   Mouth:  MMM Neck:  no carotid bruit or thyromegaly no cervical or supraclavicular lymphadenopathy  Lungs:  Normal respiratory effort, chest expands symmetrically. Lungs are clear to auscultation, no crackles or wheezes. Heart:  Normal rate and regular rhythm. S1 and S2 normal without gallop, murmur, click, rub or other extra sounds. Abdomen:  ttp in right upper quadrant, no rebound no guarding, no mass Msk:  B ttp over insertion of achiles tendon Pulses:  R and L posterior tibial pulses are full and equal bilaterally  Extremities:  no edema  Diabetes Management Exam:    Foot Exam (with socks and/or shoes not present):       Sensory-Pinprick/Light touch:          Left medial foot (L-4): normal          Left dorsal foot (L-5): normal          Left lateral foot (S-1): normal          Right medial foot (L-4): normal          Right dorsal foot (L-5): normal          Right lateral foot (S-1): normal       Sensory-Monofilament:          Left foot: normal          Right foot: normal       Inspection:          Left foot: normal          Right foot: normal       Nails:          Left foot: normal          Right foot: normal   Impression & Recommendations:  Problem # 1:  ABDOMINAL PAIN, RIGHT UPPER QUADRANT (ICD-789.01)  Most consistent with gallbladder issue, or GERD.  Will check [place] of RUQ,restart omeprazole 40 mg daily.  Discussed healthy eating habits.  Orders: Radiology Referral (Radiology)  Problem # 2:  ACHILLES TENDINITIS, BILATERAL  (ICD-726.71) NSAIDs.Marland Kitchenstop if abdominal pain worsened. Info given..start stretching exercsies and ice. Follow up if not improving.   Problem # 3:  DIABETES MELLITUS, TYPE II (ICD-250.00) Poor control.. cannot afford other meidcaiton...noncompliant with recommendations and increases. Increase 70/30 to &)_ units two times a day..cannot afford long acting or other oral medication. He states he wants to work on exercise aggressively when ankles feeling better.  Consider application for financial assistance for Januvia, actos next OV.  His updated medication list for this problem includes:    Novolog Mix 70/30 70-30 % Susp (  Insulin aspart prot & aspart) .Marland KitchenMarland KitchenMarland KitchenMarland Kitchen 70 units prior  to breakfast,  70 units prior to supper    Metformin Hcl 1000 Mg Tabs (Metformin hcl) .Marland Kitchen... Take 1 tablet by mouth two times a day  Labs Reviewed: Creat: 0.9 (01/12/2009)   Microalbumin: Negative (10/28/2006) Reviewed HgBA1c results: 11.0 (01/12/2009)  11.9 (07/09/2008)  Problem # 4:  DEPRESSIVE DISORDER (ICD-311) Start low dose fluoxetine. No SI. Offered counseling, cannot afford.  His updated medication list for this problem includes:    Fluoxetine Hcl 20 Mg Caps (Fluoxetine hcl) .Marland Kitchen... 1 tab by daily  Complete Medication List: 1)  Advil 200 Mg Caps (Ibuprofen) .... As needed 2)  Mens Multivitamin Plus Tabs (Multiple vitamins-minerals) .... Once daily 3)  Coreg 6.25 Mg Tabs (Carvedilol) .... Take 1 tablet by mouth two times a day 4)  Ventolin Hfa 108 (90 Base) Mcg/act Aers (Albuterol sulfate) .Marland Kitchen.. 1-2 puffs q 4h as needed chest tightness of wheezing 5)  Novolog Mix 70/30 70-30 % Susp (Insulin aspart prot & aspart) .... 70 units prior  to breakfast,  70 units prior to supper 6)  Fish Oil Oil (Fish oil) .... Takes 2 per day 7)  Pravastatin Sodium 40 Mg Tabs (Pravastatin sodium) .... Take 1 tablet by mouth once a day 8)  Metformin Hcl 1000 Mg Tabs (Metformin hcl) .... Take 1 tablet by mouth two times a day 9)  Fluoxetine Hcl  20 Mg Caps (Fluoxetine hcl) .Marland Kitchen.. 1 tab by daily 10)  Omeprazole 40 Mg Cpdr (Omeprazole) .Marland Kitchen.. 1 tab by mouth daily 11)  Diclofenac Sodium 75 Mg Tbec (Diclofenac sodium) .Marland Kitchen.. 1 tab by mouth two times a day as needed pain in ankles  Patient Instructions: 1)  Increase to Humulin 70/30 to 70 Units two times a day.  2)  Start fluoxetine.Marland Kitchenantidepressant once daily at bedtime. 3)  Use diclofenac for ankle pain. 4)  ICe and stretches. 5)  Start omeprazole for reflux?chest pain.  6)  BMP prior to visit, ICD-9: 250.00 7)  Hepatic Panel prior to visit ICD-9:  8)  Lipid panel prior to visit ICD-9 :  9)  HgBA1c prior to visit  ICD-9:  10)  Please schedule a follow-up appointment in 1 month 30 min OV DM. 11)  Referral Appointment Information 12)  Day/Date: 13)  Time: 14)  Place/MD: 15)  Address: 16)  Phone/Fax: 17)  Patient given appointment information. Information/Orders faxed/mailed.  Prescriptions: DICLOFENAC SODIUM 75 MG TBEC (DICLOFENAC SODIUM) 1 tab by mouth two times a day as needed pain in ankles  #60 x 0   Entered and Authorized by:   Kerby Nora MD   Signed by:   Kerby Nora MD on 06/02/2009   Method used:   Electronically to        [facility] [address] (retail)       [address]       [place], [place]  [address]       Ph: [phone]       Fax: [phone]   RxID:   [id number] OMEPRAZOLE 40 MG CPDR (OMEPRAZOLE) 1 tab by mouth daily  #30 x 5   Entered and Authorized by:   Kerby Nora MD   Signed by:   Kerby Nora MD on 06/02/2009   Method used:   Electronically to        [facility] [address] (retail)       [address], [place]  [address]       [place], [place]  [address]       Ph: [phone]       Fax: [phone]   RxID:   [id number] FLUOXETINE HCL 20 MG CAPS (FLUOXETINE HCL) 1 tab by daily  #30 x 5   Entered and Authorized by:   Kerby Nora MD   Signed by:   Kerby Nora MD on 06/02/2009   Method  used:   Electronically to        [facility] [address] (retail)       [address]       [place], [place]  [address]       Ph: [phone]       Fax: [phone]   RxID:   [id number]   Current Allergies (reviewed today): ! LIPITOR (ATORVASTATIN CALCIUM)   Family History:    Reviewed history from 12/05/2006 and no changes required:       father died age 48 Dm, MI       mother age 1 Alzheimer's, emphesema       Family History of CAD Male 1st degree relative <50       Family History Diabetes 1st degree relative       brother ? neck cancer  Social History:    Reviewed history from 03/26/2007 and no changes required:       disabled because of back injury       finacial concerns       wife with depression       daughter died 9 months ago       Married       Never Smoked       Alcohol use-no       Drug use-no       Regular exercise-yes

## 2010-09-28 NOTE — Assessment & Plan Note (Signed)
Summary: OV   Vital Signs:  Patient Profile:   60 Years Old Male Height:     68.5 inches Weight:      270.56 pounds Temp:     97.7 degrees F oral Pulse rate:   84 / minute Pulse rhythm:   regular BP sitting:   146 / 90  (left arm) Cuff size:   large  Vitals Entered By: Delilah Shan (June 21, 2007 12:11 PM)                 Chief Complaint:  Cough, congestion, and feels bad.  Acute Visit History:      The patient complains of cough, fever, nasal discharge, and sore throat.  These symptoms began 1 week ago.  Other comments include: progressively worse fatigue no medication.        His highest temperature has been subjective.        The patient notes wheezing.  The cough interferes with his sleep.  The character of the cough is described as nonproductive.  There is no history of shortness of breath associated with his cough.         Current Allergies (reviewed today): ! LIPITOR (ATORVASTATIN CALCIUM)      Physical Exam  General:     fatigued appearing , NAD Ears:     External ear exam shows no significant lesions or deformities.  Otoscopic examination reveals clear canals, tympanic membranes are intact bilaterally without bulging, retraction, inflammation or discharge. Hearing is grossly normal bilaterally. Nose:     External nasal examination shows no deformity or inflammation. Nasal mucosa are pink and moist without lesions or exudates. Mouth:     Oral mucosa and oropharynx without lesions or exudates.  Teeth in good repair. Lungs:     B diffuse wheeze on expiration, no rales, rhoni=chino dullness and no fremitus.   Heart:     Normal rate and regular rhythm. S1 and S2 normal without gallop, murmur, click, rub or other extra sounds. Cervical Nodes:     No lymphadenopathy noted    Impression & Recommendations:  Problem # 1:  BRONCHITIS, ACUTE (ICD-466.0)  His updated medication list for this problem includes:    Azithromycin 250 Mg Tabs (Azithromycin)  .Marland Kitchen... 2 tab by mouth x 1 day, then 1 tab daily    Albuterol 90 Mcg/act Aers (Albuterol) .Marland Kitchen... 2 puff inh q 4 hour as needed wheeze    Guaifenesin-codeine 100-10 Mg/26ml Syrp (Guaifenesin-codeine) .Marland Kitchen... 1-2 teaspoon by mouth q hs as needed cough  Take antibiotics and other medications as directed. Encouraged to push clear liquids, get enough rest, and take acetaminophen as needed. To be seen in 5-7 days if no improvement, sooner if worse.  Call if wheezing no t improving or SOB.   Complete Medication List: 1)  Metformin Hcl 1000 Mg Tabs (Metformin hcl) .... Two times a day 2)  Glipizide 5 Mg Tabs (Glipizide) .... Take 1 tablet by mouth once a day 3)  Enalapril Maleate 10 Mg Tabs (Enalapril maleate) .... Once daily 4)  Advil 200 Mg Caps (Ibuprofen) .... As needed 5)  Mens Multivitamin Plus Tabs (Multiple vitamins-minerals) .... Once daily 6)  Enalapril Maleate 20 Mg Tabs (Enalapril maleate) .... Take 1 tablet by mouth once a day 7)  Cialis 20 Mg Tabs (Tadalafil) .... 1/2-1 tab by mouth daily as needed ed 8)  Azithromycin 250 Mg Tabs (Azithromycin) .... 2 tab by mouth x 1 day, then 1 tab daily 9)  Albuterol 90 Mcg/act  Aers (Albuterol) .... 2 puff inh q 4 hour as needed wheeze 10)  Guaifenesin-codeine 100-10 Mg/79ml Syrp (Guaifenesin-codeine) .Marland Kitchen.. 1-2 teaspoon by mouth q hs as needed cough  Other Orders: CXR- 2view (CXR)     Prescriptions: GUAIFENESIN-CODEINE 100-10 MG/5ML  SYRP (GUAIFENESIN-CODEINE) 1-2 teaspoon by mouth q HS as needed cough  #8 oz x 0   Entered and Authorized by:   Kerby Nora MD   Signed by:   Kerby Nora MD on 06/21/2007   Method used:   Print then Give to Patient   RxID:   [id number] ALBUTEROL 90 MCG/ACT  AERS (ALBUTEROL) 2 puff inh q 4 hour as needed wheeze  #1 x 0   Entered and Authorized by:   Kerby Nora MD   Signed by:   Kerby Nora MD on 06/21/2007   Method used:   Electronically sent to ...       [facility]*       [address]        [place], [place]  [address]       Ph: [phone] or [phone]       Fax: [phone]   RxID:   [id number] AZITHROMYCIN 250 MG  TABS (AZITHROMYCIN) 2 tab by mouth x 1 day, then 1 tab daily  #6 x 0   Entered and Authorized by:   Kerby Nora MD   Signed by:   Kerby Nora MD on 06/21/2007   Method used:   Electronically sent to ...       [facility]*       [address]       [place], [place]  [address]       Ph: [phone] or [phone]       Fax: [phone]   RxID:   [id number]  ] Current Allergies (reviewed today): ! LIPITOR (ATORVASTATIN CALCIUM) Current Medications (including changes made in today's visit):  METFORMIN HCL 1000 MG  TABS (METFORMIN HCL) two times a day GLIPIZIDE 5 MG  TABS (GLIPIZIDE) Take 1 tablet by mouth once a day ENALAPRIL MALEATE 10 MG  TABS (ENALAPRIL MALEATE) once daily ADVIL 200 MG  CAPS (IBUPROFEN) as needed MENS MULTIVITAMIN PLUS   TABS (MULTIPLE VITAMINS-MINERALS) once daily ENALAPRIL MALEATE 20 MG  TABS (ENALAPRIL MALEATE) Take 1 tablet by mouth once a day CIALIS 20 MG  TABS (TADALAFIL) 1/2-1 tab by mouth daily as needed ED AZITHROMYCIN 250 MG  TABS (AZITHROMYCIN) 2 tab by mouth x 1 day, then 1 tab daily ALBUTEROL 90 MCG/ACT  AERS (ALBUTEROL) 2 puff inh q 4 hour as needed wheeze GUAIFENESIN-CODEINE 100-10 MG/5ML  SYRP (GUAIFENESIN-CODEINE) 1-2 teaspoon by mouth q HS as needed cough

## 2010-09-28 NOTE — Procedures (Signed)
Summary: Colonoscopy  Patient: [name] Note: All result statuses are Final unless otherwise noted.  Tests: (1) Colonoscopy (COL)   COL Colonoscopy           DONE     [facility]     [address]     [place], [place]  [address]           COLONOSCOPY PROCEDURE REPORT           PATIENT:  [name], [name]  MR#:  [id number]     BIRTHDATE:  1950-10-26, 58 yrs. old  GENDER:  male           ENDOSCOPIST:  Hedwig Morton. Juanda Chance, MD     Referred by:  [name], M.D.           PROCEDURE DATE:  09/03/2009     PROCEDURE:  Colonoscopy [id number]     ASA CLASS:  Class II     INDICATIONS:  Routine Risk Screening           MEDICATIONS:   Versed 6 mg, Fentanyl 75 mcg           DESCRIPTION OF PROCEDURE:   After the risks benefits and     alternatives of the procedure were thoroughly explained, informed     consent was obtained.  Digital rectal exam was performed and     revealed no rectal masses.   The LB [id number] endoscope     was introduced through the anus and advanced to the cecum, which     was identified by both the appendix and ileocecal valve, without     limitations.  The quality of the prep was adequate, using MiraLax.     The instrument was then slowly withdrawn as the colon was fully     examined.     <<PROCEDUREIMAGES>>           FINDINGS:  No polyps or cancers were seen (see image1, image2,     image3, and image4).   Retroflexed views in the rectum revealed no     abnormalities.    The scope was then withdrawn from the patient     and the procedure completed.           COMPLICATIONS:  None           ENDOSCOPIC IMPRESSION:     1) No polyps or cancers     2) Normal colonoscopy     RECOMMENDATIONS:     1) high fiber diet           REPEAT EXAM:  In 10 year(s) for.           ______________________________     Hedwig Morton. Juanda Chance, MD           CC:           n.     eSIGNED:   Hedwig Morton. Dorella Laster at 09/03/2009 11:56 AM           Larry Sierras, [id number]  Note: An  exclamation mark (!) indicates a result that was not dispersed into the flowsheet. Document Creation Date: 09/03/2009 1:00 PM _______________________________________________________________________  (1) Order result status: Final Collection or observation date-time: 09/03/2009 11:51 Requested date-time:  Receipt date-time:  Reported date-time:  Referring Physician:   Ordering Physician: Lina Sar [phone]) Specimen Source:  Source: Launa Grill Order Number: [id number] Lab site:   Appended Document: Colonoscopy    Clinical Lists Changes  Observations: Added new observation  of COLONNXTDUE: 08/2019 (09/03/2009 13:26)

## 2010-09-28 NOTE — Assessment & Plan Note (Signed)
Summary: chest pain   Vital Signs:  Patient Profile:   60 Years Old Male Height:     68.5 inches Weight:      263.50 pounds Temp:     97.8 degrees F oral Pulse rate:   80 / minute Pulse rhythm:   regular BP sitting:   144 / 80  (left arm) Cuff size:   large  Vitals Entered By: Delilah Shan (March 20, 2008 9:24 AM)                 Chief Complaint:  Chest Pain.  History of Present Illness: Seen in ER last night, records reviwed: Chest pain began last PM after eating a       cheeseburger - non exertional, constant allnight long, located inthe  epigastric       region  (points  under  thexiphoid), radiates to the back,worse with eating and       associated with nasuea and vomiting.  He had [facility]  [facility]  [facility]  with  no       improvement and then 8mg  of Morphine with pain from 10/10 to 5/10. Pt had clean       cath in 11/08 by Dr. Eden Emms.  He has h/o non compliance with diet and insulin had aCBG of 450 in ER CBC neg, neg acetone, nml CMET, lipase 20, neg cardiac enzymes. CT of abdomen nml, normal bedside US for AAA. Relief of epigastric pain with famotidine. Had not taken insulin last night.  Still today has some epigastric pain, decrease appetite.     Current Allergies (reviewed today): ! LIPITOR (ATORVASTATIN CALCIUM)  Past Medical History:    Reviewed history from 12/05/2006 and no changes required:       Coronary artery disease       Diabetes mellitus, type II       Hyperlipidemia       Hypertension       Osteoarthritis   Social History:    Reviewed history from 03/26/2007 and no changes required:       disabled because of back injury       finacial concerns       wife with depression       daughter died 9 months ago       Married       Never Smoked       Alcohol use-no       Drug use-no       Regular exercise-yes           Review of Systems      See HPI   Physical Exam  General:     alert, well-developed, well-nourished, and  well-hydrated.  morbidly obese   Neck:     no carotid bruit or thyromegaly  Lungs:     Normal respiratory effort, chest expands symmetrically. Lungs are clear to auscultation, no crackles or wheezes. Heart:     Normal rate and regular rhythm. S1 and S2 normal without gallop, murmur, click, rub or other extra sounds. Abdomen:     ttp mild in epigastrum    Impression & Recommendations:  Problem # 1:  CHEST PAIN (ICD-786.50) Nml cath earlier in year.  Likely due to GERD, gastritis.  Start higher dose of omeprazole then given in ER. Rx given. Diet info given, lifestyle changes recommended.  If not improving will try nexium or protonix and check Hpylori.  Consider EGD if not improving after thoae changes.  Problem #  2:  DIABETES MELLITUS, TYPE II (ICD-250.00) Assessment: Comment Only poor control. HAs appt in next few weks. Encouraged exercise, weight loss, healthy eating habits.  His updated medication list for this problem includes:    Enalapril Maleate 20 Mg Tabs (Enalapril maleate) .Marland Kitchen... 2 tab by mouth daily    Humulin 70/30 70-30 % Susp (Insulin isophane & regular) .Marland Kitchen... 25  units two times a day, if fasting blood sugar not less than 200 increase to 30 units twice daily  Labs Reviewed: HgBA1c: 10.6 (01/08/2008)   Creat: 0.9 (01/08/2008)   Microalbumin: 0.9 (11/21/2006)   Complete Medication List: 1)  Advil 200 Mg Caps (Ibuprofen) .... As needed 2)  Mens Multivitamin Plus Tabs (Multiple vitamins-minerals) .... Once daily 3)  Enalapril Maleate 20 Mg Tabs (Enalapril maleate) .... 2 tab by mouth daily 4)  Coreg 6.25 Mg Tabs (Carvedilol) .... Take 1 tablet by mouth two times a day 5)  Humulin 70/30 70-30 % Susp (Insulin isophane & regular) .... 25  units two times a day, if fasting blood sugar not less than 200 increase to 30 units twice daily 6)  Amlodipine Besylate 5 Mg Tabs (Amlodipine besylate) .... Take 1 tablet by mouth once a day 7)  Ventolin Hfa 108 (90 Base) Mcg/act Aers  (Albuterol sulfate) .Marland Kitchen.. 1-2 puffs q 4h as needed chest tightness of wheezing 8)  Pravastatin Sodium 40 Mg Tabs (Pravastatin sodium) .... Take 1 tablet by mouth once a day 9)  Vicodin 5-500 Mg Tabs (Hydrocodone-acetaminophen) 10)  Omeprazole 40 Mg Cpdr (Omeprazole) .Marland Kitchen.. 1 tab by mouth daily    Prescriptions: OMEPRAZOLE 40 MG  CPDR (OMEPRAZOLE) 1 tab by mouth daily  #30 x 3   Entered and Authorized by:   Kerby Nora MD   Signed by:   Kerby Nora MD on 03/20/2008   Method used:   Electronically sent to ...       [facility] [address]       [address]       [place], [place]  [address]       Ph: [phone]       Fax: [phone]   RxID:   [id number]  ] Current Allergies (reviewed today): ! LIPITOR (ATORVASTATIN CALCIUM) Current Medications (including changes made in today's visit):  ADVIL 200 MG  CAPS (IBUPROFEN) as needed MENS MULTIVITAMIN PLUS   TABS (MULTIPLE VITAMINS-MINERALS) once daily ENALAPRIL MALEATE 20 MG  TABS (ENALAPRIL MALEATE) 2 tab by mouth daily COREG 6.25 MG  TABS (CARVEDILOL) Take 1 tablet by mouth two times a day HUMULIN 70/30 70-30 %  SUSP (INSULIN ISOPHANE & REGULAR) 25  units two times a day, if fasting blood sugar not less than 200 increase to 30 Units twice daily AMLODIPINE BESYLATE 5 MG  TABS (AMLODIPINE BESYLATE) Take 1 tablet by mouth once a day VENTOLIN HFA 108 (90 BASE) MCG/ACT  AERS (ALBUTEROL SULFATE) 1-2 puffs q 4h as needed chest tightness of wheezing PRAVASTATIN SODIUM 40 MG  TABS (PRAVASTATIN SODIUM) Take 1 tablet by mouth once a day VICODIN 5-500 MG  TABS (HYDROCODONE-ACETAMINOPHEN)  OMEPRAZOLE 40 MG  CPDR (OMEPRAZOLE) 1 tab by mouth daily

## 2010-09-28 NOTE — Consult Note (Signed)
Summary: [facility]  [facility] Picket FNP-C   Imported By: Eleonore Chiquito 01/14/2008 12:42:52  _____________________________________________________________________  External Attachment:    Type:   Image     Comment:   External Document  Appended Document: [facility] Picket FNP-C Please fax last labs and flowsheet of PSA values to [facility]rologist at [facility]. Please Let them know that we didn't check aPSA at this last check because we knew he was seeing them. If we need to check it we can.  Appended Document: [facility] Picket FNP-C I'm working on this.  I've printed labs and am trying to locate flowsheet.    Appended Document: [facility] Labs faxed to [facility] as instructed.

## 2010-09-28 NOTE — Assessment & Plan Note (Signed)
Summary: F/U/CLE   Vital Signs:  Patient profile:   60 year old male Height:      68.5 inches Weight:      287.50 pounds BMI:     43.23 Temp:     98.2 degrees F oral Pulse rate:   76 / minute Pulse rhythm:   regular BP sitting:   136 / 76  (left arm) Cuff size:   large  Vitals Entered By: Delilah Shan (Jan 12, 2009 8:21 AM) CC: Follow up.   Med Refill of Coreg   History of Present Illness: Insurance cancelled...cannot afford any medicaiton...stopped many medications.  DM, poor control: FBS: still around 250.  Also post prandial around 300.  HTN, fair control:stopped enalapril and amlodipine...only taking coreg 6.25 two times a day  States he feels better off medicaiton..felt drained, itchy. Watching more what he eats now... 10 l weight gain over winter...but limited with exercsie due to chronic back issues.  Chol:  stopped cholesterol medication.   Problems Prior to Update: 1)  Healthy Adult Male  (ICD-V70.0) 2)  Special Screening For Malignant Neoplasms Colon  (ICD-V76.51) 3)  Special Screening Malignant Neoplasm of Prostate  (ICD-V76.44) 4)  Depressive Disorder  (ICD-311) 5)  Erectile Dysfunction  (ICD-302.72) 6)  Obesity  (ICD-278.00) 7)  Family History Diabetes 1st Degree Relative  (ICD-V18.0) 8)  Family History of Cad Male 1st Degree Relative <50  (ICD-V17.3) 9)  Osteoarthritis  (ICD-715.90) 10)  Hypertension  (ICD-401.9) 11)  Hyperlipidemia  (ICD-272.4) 12)  Diabetes Mellitus, Type II  (ICD-250.00) 13)  Coronary Artery Disease  (ICD-414.00) 14)  Hx of Renal Calculus, Hx of  (ICD-V13.01)  Current Medications (verified): 1)  Advil 200 Mg  Caps (Ibuprofen) .... As Needed 2)  Mens Multivitamin Plus   Tabs (Multiple Vitamins-Minerals) .... Once Daily 3)  Coreg 6.25 Mg  Tabs (Carvedilol) .... Take 1 Tablet By Mouth Two Times A Day 4)  Ventolin Hfa 108 (90 Base) Mcg/act  Aers (Albuterol Sulfate) .Marland Kitchen.. 1-2 Puffs Q 4h As Needed Chest Tightness of Wheezing 5)  Novolog  Mix 70/30 70-30 % Susp (Insulin Aspart Prot & Aspart) .... 70 Units Prior  To Breakfast,  70 Units Prior To Supper 6)  Fish Oil   Oil (Fish Oil) .... Takes 2 Per Day  Allergies: 1)  ! Lipitor (Atorvastatin Calcium)  Past History:  Past medical, surgical, family and social histories (including risk factors) reviewed, and no changes noted (except as noted below).  Past Medical History:    Reviewed history from 12/05/2006 and no changes required:    Coronary artery disease    Diabetes mellitus, type II    Hyperlipidemia    Hypertension    Osteoarthritis  Past Surgical History:    Reviewed history from 07/06/2007 and no changes required:    09/2000: Cardiac Cath  diffuse LAD, 30 % LCA  EF 50-65%    02/2002 back injury: workers comp    11/08 cath:no significant CAD  Family History:    Reviewed history from 12/05/2006 and no changes required:       father died age 67 Dm, MI       mother age 27 Alzheimer's, emphesema       Family History of CAD Male 1st degree relative <50       Family History Diabetes 1st degree relative       brother ? neck cancer  Social History:    Reviewed history from 03/26/2007 and no changes required:  disabled because of back injury       finacial concerns       wife with depression       daughter died 31 months ago       Married       Never Smoked       Alcohol use-no       Drug use-no       Regular exercise-yes         Review of Systems General:  Denies fatigue and fever; 'I feel great ". CV:  Denies chest pain or discomfort. Resp:  Denies shortness of breath.  Physical Exam  General:  alert, well-developed, well-nourished, and well-hydrated.  morbidly obese   Mouth:  no exudates and pharyngeal erythema.   Neck:  no carotid bruit or thyromegaly  Lungs:  Normal respiratory effort, chest expands symmetrically. Lungs are clear to auscultation, no crackles or wheezes. Heart:  Normal rate and regular rhythm. S1 and S2 normal without gallop,  murmur, click, rub or other extra sounds. Pulses:  R and L posterior tibial pulses are full and equal bilaterally  Extremities:  no edema  Diabetes Management Exam:    Foot Exam (with socks and/or shoes not present):       Sensory-Pinprick/Light touch:          Left medial foot (L-4): normal          Left dorsal foot (L-5): normal          Left lateral foot (S-1): normal          Right medial foot (L-4): normal          Right dorsal foot (L-5): normal          Right lateral foot (S-1): normal       Sensory-Monofilament:          Left foot: normal          Right foot: normal       Inspection:          Left foot: normal          Right foot: normal       Nails:          Left foot: normal          Right foot: normal   Impression & Recommendations:  Problem # 1:  DIABETES MELLITUS, TYPE II (ICD-250.00)  Poo control...counseled on diet and exercise...x 20 mintues. Increase insulin to 70 Units two times a day...instructed on correct use.  The following medications were removed from the medication list:    Enalapril Maleate 20 Mg Tabs (Enalapril maleate) .Marland Kitchen... 2 tab by mouth daily    Humulin 70/30 70-30 % Susp (Insulin isophane & regular) .Marland KitchenMarland KitchenMarland KitchenMarland Kitchen 50 units injected twice daily His updated medication list for this problem includes:    Novolog Mix 70/30 70-30 % Susp (Insulin aspart prot & aspart) .Marland KitchenMarland KitchenMarland KitchenMarland Kitchen 70 units prior  to breakfast,  70 units prior to supper  Orders: TLB-BMP (Basic Metabolic Panel-BMET) (80048-METABOL) TLB-Hepatic/Liver Function Pnl (80076-HEPATIC) TLB-Lipid Panel (80061-LIPID) TLB-Microalbumin/Creat Ratio, Urine (82043-MALB) TLB-A1C / Hgb A1C (Glycohemoglobin) (83036-A1C)  Labs Reviewed: Creat: 0.9 (07/09/2008)   Microalbumin: Negative (10/28/2006) Reviewed HgBA1c results: 11.9 (07/09/2008)  12.8 (04/09/2008)  Problem # 2:  HYPERTENSION (ICD-401.9)  Appears controlle don mediciton...still above goal 130/80...but will address  DM and chol issues first.  The  following medications were removed from the medication list:    Enalapril Maleate 20 Mg Tabs (Enalapril maleate) .Marland Kitchen... 2 tab  by mouth daily    Amlodipine Besylate 5 Mg Tabs (Amlodipine besylate) .Marland Kitchen... Take 1 tablet by mouth once a day His updated medication list for this problem includes:    Coreg 6.25 Mg Tabs (Carvedilol) .Marland Kitchen... Take 1 tablet by mouth two times a day  BP today: 136/76 Prior BP: 132/74 (07/14/2008)  Prior 10 Yr Risk Heart Disease: N/A (04/10/2007)  Labs Reviewed: K+: 5.2 (07/09/2008) Creat: : 0.9 (07/09/2008)   Chol: 192 (07/09/2008)   HDL: 32.3 (07/09/2008)   LDL: 142 (07/09/2008)   TG: 88 (07/09/2008)  Problem # 3:  HYPERLIPIDEMIA (ICD-272.4)  Likely poor control...off medications.  The following medications were removed from the medication list:    Pravastatin Sodium 40 Mg Tabs (Pravastatin sodium) .Marland Kitchen... Take 1 tablet by mouth once a day    Zetia 10 Mg Tabs (Ezetimibe) .Marland Kitchen... Take 1 tablet by mouth once a day  Labs Reviewed: SGOT: 13 (07/09/2008)   SGPT: 17 (07/09/2008)  Lipid Goals: Chol Goal: 200 (10/09/2007)   HDL Goal: 40 (10/09/2007)   LDL Goal: 70 (10/09/2007)   TG Goal: 150 (10/09/2007)  Prior 10 Yr Risk Heart Disease: N/A (04/10/2007)   HDL:32.3 (07/09/2008), 28.7 (04/09/2008)  LDL:142 (07/09/2008), 120 (04/09/2008)  Chol:192 (07/09/2008), 174 (04/09/2008)  Trig:88 (07/09/2008), 129 (04/09/2008)  Problem # 4:  CORONARY ARTERY DISEASE (ICD-414.00) Needs better risk factor control. The following medications were removed from the medication list:    Enalapril Maleate 20 Mg Tabs (Enalapril maleate) .Marland Kitchen... 2 tab by mouth daily    Amlodipine Besylate 5 Mg Tabs (Amlodipine besylate) .Marland Kitchen... Take 1 tablet by mouth once a day His updated medication list for this problem includes:    Coreg 6.25 Mg Tabs (Carvedilol) .Marland Kitchen... Take 1 tablet by mouth two times a day  Complete Medication List: 1)  Advil 200 Mg Caps (Ibuprofen) .... As needed 2)  Mens Multivitamin Plus  Tabs (Multiple vitamins-minerals) .... Once daily 3)  Coreg 6.25 Mg Tabs (Carvedilol) .... Take 1 tablet by mouth two times a day 4)  Ventolin Hfa 108 (90 Base) Mcg/act Aers (Albuterol sulfate) .Marland Kitchen.. 1-2 puffs q 4h as needed chest tightness of wheezing 5)  Novolog Mix 70/30 70-30 % Susp (Insulin aspart prot & aspart) .... 70 units prior  to breakfast,  70 units prior to supper 6)  Fish Oil Oil (Fish oil) .... Takes 2 per day  Patient Instructions: 1)  Please schedule a follow-up appointment in 3 months  DM 2)  HgBA1c prior to visit  ICD-9: 250.00 Prescriptions: COREG 6.25 MG  TABS (CARVEDILOL) Take 1 tablet by mouth two times a day  #60 x 11   Entered and Authorized by:   Kerby Nora MD   Signed by:   Kerby Nora MD on 01/12/2009   Method used:   Electronically to        [facility] (retail)       [address]       [place], [place]  [address]       Ph: [phone]       Fax: [phone]   RxID:   [id number]   Current Allergies (reviewed today): ! LIPITOR (ATORVASTATIN CALCIUM)

## 2010-09-28 NOTE — Assessment & Plan Note (Signed)
Summary: F/U from Hospital   Vital Signs:  Patient Profile:   60 Years Old Male Height:     68.5 inches Weight:      269 pounds Temp:     97.8 degrees F oral Pulse rate:   80 / minute Pulse rhythm:   regular BP sitting:   144 / 76  (left arm) Cuff size:   large  Vitals Entered By: Delilah Shan (July 06, 2007 9:22 AM)                 Chief Complaint:  Hospital follow up.  History of Present Illness: Needs refills on his new meds that were started in hospital if you  elect to keep him on them...................................................................Marland KitchenLugene Fuquay  July 06, 2007 9:46 AM   Hospitalized 10/31 x 5 days central chest pain, sweating , weak, nauseated, neg cath, nml abdominal US except  no further chest pain doesn't feel great now tired, fatigue worried about everything anhedonia frustrated with financial issues doens't want to talk to anybody wants to cry all the time lost child 15 months ago no SI  started on fluoxetine  BP was high in hospital so started on coreg 6.25 two times a day Started on insulin Humulin 70/30yesterday 15 units two times a day  A1C was 9.3 This AM CBG 242 stopped metformin  Current Allergies (reviewed today): ! LIPITOR (ATORVASTATIN CALCIUM)  Past Medical History:    Reviewed history from 12/05/2006 and no changes required:       Coronary artery disease       Diabetes mellitus, type II       Hyperlipidemia       Hypertension       Osteoarthritis  Past Surgical History:    09/2000: Cardiac Cath  diffuse LAD, 30 % LCA  EF 50-65%    02/2002 back injury: workers comp    11/08 cath:no significant CAD   Family History:    Reviewed history from 12/05/2006 and no changes required:       father died age 32 Dm, MI       mother age 74 Alzheimer's, emphesema       Family History of CAD Male 1st degree relative <50       Family History Diabetes 1st degree relative       brother ? neck cancer     Physical  Exam  General:     fatigued appearing , NAD, overweight Neck:     no carotid bruit Lungs:     Normal respiratory effort, chest expands symmetrically. Lungs are clear to auscultation, no crackles or wheezes. Heart:     Normal rate and regular rhythm. S1 and S2 normal without gallop, murmur, click, rub or other extra sounds. Abdomen:     Bowel sounds positive,abdomen soft and non-tender without masses, organomegaly or hernias noted. Extremities:     no edema Psych:     depressed affect.      Impression & Recommendations:  Problem # 1:  CHEST PAIN (ICD-786.50) resolved. Neg cath.  ? secondary to anxiety vs GERd  Problem # 2:  DEPRESSIVE DISORDER (ICD-311) Encourased continuing medication, may take 3-4 weeks to work. No SI. His updated medication list for this problem includes:    Fluoxetine Hcl 10 Mg Caps (Fluoxetine hcl) .Marland Kitchen... Take 1 tablet by mouth once a day   Problem # 3:  HYPERTENSION (ICD-401.9) On higher doses on medication.  Follow BP at home, call if consistently above  140/90.  REcheck at next visit. The following medications were removed from the medication list:    Enalapril Maleate 10 Mg Tabs (Enalapril maleate) ..... Once daily  His updated medication list for this problem includes:    Enalapril Maleate 20 Mg Tabs (Enalapril maleate) .Marland Kitchen... Take 1 tablet by mouth once a day    Coreg 6.25 Mg Tabs (Carvedilol) .Marland Kitchen... Take 1 tablet by mouth two times a day  BP today: 144/76 Prior BP: 146/90 (06/21/2007)  Prior 10 Yr Risk Heart Disease: N/A (04/10/2007)  Labs Reviewed: Creat: 0.9 (04/11/2007) Chol: 235 (04/11/2007)   HDL: 29.4 (04/11/2007)   LDL: DEL (04/11/2007)   TG: 234 (04/11/2007)   Problem # 4:  DIABETES MELLITUS, TYPE II (ICD-250.00) Started insulin.  Following CBGs.  Still far from goal.  He will call with measurements after 1 week to titrate over the phone.  We will not see him back at office for 3 months because financial difficulties, but will try to  improve control of DM over the phone. Encouraged exercise, weight loss, healthy eating habits.  The following medications were removed from the medication list:    Metformin Hcl 1000 Mg Tabs (Metformin hcl) .Marland Kitchen..Marland Kitchen Two times a day    Glipizide 5 Mg Tabs (Glipizide) .Marland Kitchen... Take 1 tablet by mouth once a day    Enalapril Maleate 10 Mg Tabs (Enalapril maleate) ..... Once daily  His updated medication list for this problem includes:    Enalapril Maleate 20 Mg Tabs (Enalapril maleate) .Marland Kitchen... Take 1 tablet by mouth once a day    Bayer Aspirin 325 Mg Tabs (Aspirin) .Marland Kitchen... Take 1 tablet by mouth once a day    Glipizide 10 Mg Tabs (Glipizide) .Marland Kitchen... Take 1 tablet by mouth two times a day    Humulin 70/30 70-30 % Susp (Insulin isophane & regular) .Marland KitchenMarland KitchenMarland KitchenMarland Kitchen 15 units two times a day   Complete Medication List: 1)  Advil 200 Mg Caps (Ibuprofen) .... As needed 2)  Mens Multivitamin Plus Tabs (Multiple vitamins-minerals) .... Once daily 3)  Enalapril Maleate 20 Mg Tabs (Enalapril maleate) .... Take 1 tablet by mouth once a day 4)  Cialis 20 Mg Tabs (Tadalafil) .... 1/2-1 tab by mouth daily as needed ed 5)  Albuterol 90 Mcg/act Aers (Albuterol) .... 2 puff inh q 4 hour as needed wheeze 6)  Bayer Aspirin 325 Mg Tabs (Aspirin) .... Take 1 tablet by mouth once a day 7)  Zocor 40 Mg Tabs (Simvastatin) .... Take 1 tablet by mouth once a day 8)  Glipizide 10 Mg Tabs (Glipizide) .... Take 1 tablet by mouth two times a day 9)  Coreg 6.25 Mg Tabs (Carvedilol) .... Take 1 tablet by mouth two times a day 10)  Humulin 70/30 70-30 % Susp (Insulin isophane & regular) .Marland Kitchen.. 15 units two times a day 11)  Glucose Test Strips  12)  Fluoxetine Hcl 10 Mg Caps (Fluoxetine hcl) .... Take 1 tablet by mouth once a day 13)  Prilosec Otc 20 Mg Tbec (Omeprazole magnesium) .... Take 1 tablet by mouth once a day 14)  Insulin Syringes    Patient Instructions: 1)  Check blood sugar in mornings, then 2 hours after dinner. 2)  Take insulin with  breakfast and supper.  3)  Call with blood sugars early next week.  4)  Cancel appt next week.   5)  Please schedule a follow-up appointment in 3 months DM check. 6)  HbgA1C prior to visit, ICD-9: 250.00    ] Current  Allergies (reviewed today): ! LIPITOR (ATORVASTATIN CALCIUM) Current Medications (including changes made in today's visit):  ADVIL 200 MG  CAPS (IBUPROFEN) as needed MENS MULTIVITAMIN PLUS   TABS (MULTIPLE VITAMINS-MINERALS) once daily ENALAPRIL MALEATE 20 MG  TABS (ENALAPRIL MALEATE) Take 1 tablet by mouth once a day CIALIS 20 MG  TABS (TADALAFIL) 1/2-1 tab by mouth daily as needed ED ALBUTEROL 90 MCG/ACT  AERS (ALBUTEROL) 2 puff inh q 4 hour as needed wheeze BAYER ASPIRIN 325 MG  TABS (ASPIRIN) Take 1 tablet by mouth once a day ZOCOR 40 MG  TABS (SIMVASTATIN) Take 1 tablet by mouth once a day GLIPIZIDE 10 MG  TABS (GLIPIZIDE) Take 1 tablet by mouth two times a day COREG 6.25 MG  TABS (CARVEDILOL) Take 1 tablet by mouth two times a day HUMULIN 70/30 70-30 %  SUSP (INSULIN ISOPHANE & REGULAR) 15 units two times a day * GLUCOSE TEST STRIPS  FLUOXETINE HCL 10 MG  CAPS (FLUOXETINE HCL) Take 1 tablet by mouth once a day PRILOSEC OTC 20 MG  TBEC (OMEPRAZOLE MAGNESIUM) Take 1 tablet by mouth once a day * INSULIN SYRINGES

## 2010-09-28 NOTE — Letter (Signed)
Summary: Patient Notice-Endo Biopsy Results  [facility]  [address] [place], [place] [address]   Phone: [phone]  Fax: [phone]        June 29, 2009 MRN: [id number]    [name] [address] [place], [place]  [address]    Dear [name],  I am pleased to inform you that the biopsies taken during your recent endoscopic examination did not show any evidence of cancer upon pathologic examination.The tissue from the stomach show mild inflammation  Additional information/recommendations:  __No further action is needed at this time.  Please follow-up with      your primary care physician for your other healthcare needs.  __ Please call [phone] to schedule a return visit to review      your condition.  x__ Continue with the treatment plan as outlined on the day of your      exam.  _   Please call us if you are having persistent problems or have questions about your condition that have not been fully answered at this time.  Sincerely,  Hart Carwin MD  This letter has been electronically signed by your physician.

## 2010-09-28 NOTE — Miscellaneous (Signed)
  Clinical Lists Changes  Medications: Changed medication from NOVOLOG MIX 70/30 70-30 % SUSP (INSULIN ASPART PROT & ASPART) As directed. to NOVOLOG MIX 70/30 70-30 % SUSP (INSULIN ASPART PROT & ASPART) As directed.

## 2010-09-28 NOTE — Progress Notes (Signed)
Summary: RUQ pain  Phone Note From Other Clinic   Caller: Shirlee Limerick @Dr  Ermalene Searing [phone] Call For: Any GI Dr Reason for Call: Schedule Patient Appt Summary of Call: Upper RUQ pain x5weeks. Would like pt worked in fairly quickly. Call back to [facility]. Initial call taken by: Leanor Kail [facility],  June 18, 2009 10:00 AM  Follow-up for Phone Call        Np3 schedued for 06-19-09 9:00 with [facility] PA .  Shirlee Limerick will notify the patient  Follow-up by: Darcey Nora RN, CGRN,  June 18, 2009 10:45 AM

## 2010-09-28 NOTE — Assessment & Plan Note (Signed)
Summary: 3 month follow up/rbh   Vital Signs:  Patient Profile:   60 Years Old Male Height:     68.5 inches Weight:      271.25 pounds Temp:     97.9 degrees F oral Pulse rate:   84 / minute Pulse rhythm:   regular BP sitting:   130 / 80  (left arm) Cuff size:   large  Vitals Entered By: Delilah Shan (Jan 10, 2008 9:22 AM)                 Chief Complaint:  3 months follow up.  Diabetes Management History:      He is (or has been) enrolled in the "Diabetic Education Program".  He states understanding of dietary principles but he is not following the appropriate diet.  No sensory loss is reported.  Self foot exams are not being performed.  He is not checking home blood sugars.  He says that he is not exercising regularly.        Other questions/concerns include: has not been checking because out of strips and doesn't have money.  Since last visit, the following infection(s) have been reported: URI.    Hypertension History:      He notes no problems with any antihypertensive medication side effects.        Positive major cardiovascular risk factors include male age 43 years old or older, diabetes, hyperlipidemia, and hypertension.  Negative major cardiovascular risk factors include non-tobacco-user status.        Positive history for target organ damage include ASHD (either angina; prior MI; prior CABG).        Current Allergies (reviewed today): ! LIPITOR (ATORVASTATIN CALCIUM)  Past Medical History:    Reviewed history from 12/05/2006 and no changes required:       Coronary artery disease       Diabetes mellitus, type II       Hyperlipidemia       Hypertension       Osteoarthritis   Social History:    Reviewed history from 03/26/2007 and no changes required:       disabled because of back injury       finacial concerns       wife with depression       daughter died 9 months ago       Married       Never Smoked       Alcohol use-no       Drug use-no  Regular exercise-yes           Review of Systems  General      Denies fatigue.  CV      Denies chest pain or discomfort.  Resp      Denies shortness of breath.      getting over recent bronchitis  GI      Denies abdominal pain.  GU      Complains of erectile dysfunction.   Physical Exam  General:     alert, well-developed, well-nourished, and well-hydrated.  morbidly obese  mild reap distress Lungs:     Normal respiratory effort, chest expands symmetrically. Lungs are clear to auscultation, no crackles or wheezes. Heart:     Normal rate and regular rhythm. S1 and S2 normal without gallop, murmur, click, rub or other extra sounds. Pulses:     R and L posterior tibial pulses are full and equal bilaterally  Extremities:     no edema  Diabetes  Management Exam:    Foot Exam (with socks and/or shoes not present):       Sensory-Pinprick/Light touch:          Left medial foot (L-4): normal          Left dorsal foot (L-5): normal          Left lateral foot (S-1): normal          Right medial foot (L-4): normal          Right dorsal foot (L-5): normal          Right lateral foot (S-1): normal       Sensory-Monofilament:          Left foot: normal          Right foot: normal       Inspection:          Left foot: normal          Right foot: normal       Nails:          Left foot: normal          Right foot: normal    Impression & Recommendations:  Problem # 1:  DIABETES MELLITUS, TYPE II (ICD-250.00) Continued poor control. Worse with recent infection and surgery. Increase insulin, cannot afford LAntus. Stop glipizde, becasue no longer working The following medications were removed from the medication list:    Glipizide 10 Mg Tabs (Glipizide) .Marland Kitchen... Take 1 tablet by mouth two times a day  His updated medication list for this problem includes:    Enalapril Maleate 20 Mg Tabs (Enalapril maleate) .Marland Kitchen... 2 tab by mouth daily    Humulin 70/30 70-30 % Susp (Insulin  isophane & regular) .Marland Kitchen... 25  units two times a day, if fasting blood sugar not less than 200 increase to 30 units twice daily  Labs Reviewed: HgBA1c: 10.2 (10/17/2007)   Creat: 1.1 (10/17/2007)   Microalbumin: Negative (10/28/2006)   Problem # 2:  HYPERTENSION (ICD-401.9) At goal with increase in medication. Encouraged exercise, weight loss, healthy eating habits.  His updated medication list for this problem includes:    Enalapril Maleate 20 Mg Tabs (Enalapril maleate) .Marland Kitchen... 2 tab by mouth daily    Coreg 6.25 Mg Tabs (Carvedilol) .Marland Kitchen... Take 1 tablet by mouth two times a day    Amlodipine Besylate 5 Mg Tabs (Amlodipine besylate) .Marland Kitchen... Take 1 tablet by mouth once a day  BP today: 130/80 Prior BP: 158/90 (12/25/2007)  Prior 10 Yr Risk Heart Disease: N/A (04/10/2007)  Labs Reviewed: Creat: 1.1 (10/17/2007) Chol: 235 (04/11/2007)   HDL: 29.4 (04/11/2007)   LDL: DEL (04/11/2007)   TG: 234 (04/11/2007)   Problem # 3:  HYPERLIPIDEMIA (ICD-272.4) poor control. Reviewed dietary changes. Cannot afford nutritionist. Start pravastatin 40 mg, will likely need to increase if tolerated. Labs Reviewed: Chol: 235 (04/11/2007)   HDL: 29.4 (04/11/2007)   LDL: DEL (04/11/2007)   TG: 234 (04/11/2007) SGOT: 16 (11/21/2006)   SGPT: 22 (11/21/2006)  Lipid Goals: Chol Goal: 200 (10/09/2007)   HDL Goal: 40 (10/09/2007)   LDL Goal: 70 (10/09/2007)   TG Goal: 150 (10/09/2007)  Prior 10 Yr Risk Heart Disease: N/A (04/10/2007)  His updated medication list for this problem includes:    Pravastatin Sodium 40 Mg Tabs (Pravastatin sodium) .Marland Kitchen... Take 1 tablet by mouth once a day   Complete Medication List: 1)  Advil 200 Mg Caps (Ibuprofen) .... As needed 2)  Mens Multivitamin Plus Tabs (Multiple  vitamins-minerals) .... Once daily 3)  Enalapril Maleate 20 Mg Tabs (Enalapril maleate) .... 2 tab by mouth daily 4)  Coreg 6.25 Mg Tabs (Carvedilol) .... Take 1 tablet by mouth two times a day 5)  Humulin 70/30  70-30 % Susp (Insulin isophane & regular) .... 25  units two times a day, if fasting blood sugar not less than 200 increase to 30 units twice daily 6)  Amlodipine Besylate 5 Mg Tabs (Amlodipine besylate) .... Take 1 tablet by mouth once a day 7)  Ventolin Hfa 108 (90 Base) Mcg/act Aers (Albuterol sulfate) .Marland Kitchen.. 1-2 puffs q 4h as needed chest tightness of wheezing 8)  Pravastatin Sodium 40 Mg Tabs (Pravastatin sodium) .... Take 1 tablet by mouth once a day  Diabetes Management Assessment/Plan:      The following lipid goals have been established for the patient: Total cholesterol goal of 200; LDL cholesterol goal of 70; HDL cholesterol goal of 40; Triglyceride goal of 150.  His blood pressure goal is < 130/80.    Current Insulin Use:    Longer Acting Insulin:       Type:  Humulin 70/30       Dosage:  15 units at breakfast; 15 units at bedtime.  Hypertension Assessment/Plan:      [name] hypertensive risk group is category C: Target organ damage and/or diabetes.  Today's blood pressure is 130/80.  His blood pressure goal is < 130/80.   Patient Instructions: 1)  Stop glipizide 2)  Increase Insulin to 25 Units twice daily, then if after 1 week fasting sugar not less tham 200  increase to 30 units twice daily. 3)  Start simvastatin daily.  4)  Get yearly eye exam.  5)  Please schedule a follow-up appointment in 3 months Dm 6)  Hepatic Panel prior to visit, ICD-9: 272.0 7)  Lipid Panel prior to visit, ICD-9: 8)  HbgA1C prior to visit, ICD-9:250.0   Prescriptions: AMLODIPINE BESYLATE 5 MG  TABS (AMLODIPINE BESYLATE) Take 1 tablet by mouth once a day  #30 x 11   Entered and Authorized by:   Kerby Nora MD   Signed by:   Kerby Nora MD on 01/10/2008   Method used:   Electronically sent to ...       [facility] [address]       [address], [address]       [place], [place]  [address]       Ph: [phone]       Fax: [phone]   RxID:    [id number] COREG 6.25 MG  TABS (CARVEDILOL) Take 1 tablet by mouth two times a day  #60 x 11   Entered and Authorized by:   Kerby Nora MD   Signed by:   Kerby Nora MD on 01/10/2008   Method used:   Electronically sent to ...       [facility] [address]       [address], [address]       [place], [place]  [address]       Ph: [phone]       Fax: [phone]   RxID:   [id number] ENALAPRIL MALEATE 20 MG  TABS (ENALAPRIL MALEATE) 2 tab by mouth daily  #60 x 5   Entered and Authorized by:   Kerby Nora MD   Signed by:   Kerby Nora MD  on 01/10/2008   Method used:   Electronically sent to ...       [facility] [address]       [address]       [place], [place]  [address]       Ph: [phone]       Fax: [phone]   RxID:   [id number] PRAVASTATIN SODIUM 40 MG  TABS (PRAVASTATIN SODIUM) Take 1 tablet by mouth once a day  #30 x 11   Entered and Authorized by:   Kerby Nora MD   Signed by:   Kerby Nora MD on 01/10/2008   Method used:   Electronically sent to ...       [facility] [address]       [address], [address]       [place], [place]  [address]       Ph: [phone]       Fax: [phone]   RxID:   [id number] HUMULIN 70/30 70-30 %  SUSP (INSULIN ISOPHANE & REGULAR) 25  units two times a day, if fasting blood sugar not less than 200 increase to 30 Units twice daily  #1 box x 11   Entered and Authorized by:   Kerby Nora MD   Signed by:   Kerby Nora MD on 01/10/2008   Method used:   Electronically sent to ...       [facility] [address]       [address]       [place], [place]  [address]       Ph: [phone]       Fax: [phone]   RxID:   [id number]  ] Current Allergies (reviewed today): ! LIPITOR (ATORVASTATIN CALCIUM) Current Medications (including changes made in today's visit):  ADVIL 200 MG   CAPS (IBUPROFEN) as needed MENS MULTIVITAMIN PLUS   TABS (MULTIPLE VITAMINS-MINERALS) once daily ENALAPRIL MALEATE 20 MG  TABS (ENALAPRIL MALEATE) 2 tab by mouth daily COREG 6.25 MG  TABS (CARVEDILOL) Take 1 tablet by mouth two times a day HUMULIN 70/30 70-30 %  SUSP (INSULIN ISOPHANE & REGULAR) 25  units two times a day, if fasting blood sugar not less than 200 increase to 30 Units twice daily AMLODIPINE BESYLATE 5 MG  TABS (AMLODIPINE BESYLATE) Take 1 tablet by mouth once a day VENTOLIN HFA 108 (90 BASE) MCG/ACT  AERS (ALBUTEROL SULFATE) 1-2 puffs q 4h as needed chest tightness of wheezing PRAVASTATIN SODIUM 40 MG  TABS (PRAVASTATIN SODIUM) Take 1 tablet by mouth once a day

## 2010-09-30 LAB — CONVERTED CEMR LAB: Prolactin: 7.7 ng/mL (ref 2.1–17.1)

## 2010-09-30 NOTE — Assessment & Plan Note (Signed)
Summary: F/U/CLE   Vital Signs:  Patient profile:   60 year old male Height:      68.5 inches Weight:      308 pounds BMI:     46.32 Temp:     98.5 degrees F oral Pulse rate:   80 / minute Pulse rhythm:   regular BP sitting:   150 / 80  (left arm) Cuff size:   large  Vitals Entered By: Benny Lennert CMA Duncan Dull) (September 14, 2010 10:54 AM)  History of Present Illness: Chief complaint follow up an right hand pain   Today pt states that he has come to a point where he is giving up...stopped all meds except BP and Dm meds.   Did not reurn for fasting labs prior to appt.  Off cholesterol med.   DM: poor control Checking rarely...still some lows. Fasting CBS: 180-250 70 Units 70/30 two times a day.   Depression, poor control : was previously on fluoxetine but has stopped now. He feels fatigued all the time.. does not want to get out of bed... has gottens ome worse since stopping meds. Overwhelmed with health issues.   Right hand pain: pain in central pain.. causes shooting pain down finger. Very tender to gentle touch, no redness or warmth. No neck pain. Right arm feels more tired than left. Not using meloxicam at all for pain.  Occ gets stuck bent.. has to force straight.      Hypertension History:      He denies headache, palpitations, dyspnea with exertion, peripheral edema, syncope, and side effects from treatment.  BP 180-190/90s at home.  Further comments include: Still with GED chest pain... over the counter med does not work as well as nexium. Marland Kitchen        Positive major cardiovascular risk factors include male age 42 years old or older, diabetes, hyperlipidemia, and hypertension.  Negative major cardiovascular risk factors include non-tobacco-user status.        Positive history for target organ damage include ASHD (either angina/prior MI/prior CABG).     Problems Prior to Update: 1)  Trigger Finger, Right Middle  (ICD-727.03) 2)  Dermatitis  (ICD-692.9) 3)   Branch Retinal Vein Occlusion  (ICD-362.36) 4)  Leg Pain, Bilateral  (ICD-729.5) 5)  Constipation  (ICD-564.00) 6)  Gastritis  (ICD-535.50) 7)  Screening, Colon Cancer  (ICD-V76.51) 8)  Chest Pain  (ICD-786.50) 9)  Early Satiety  (ICD-780.94) 10)  Abdominal Pain Right Upper Quadrant  (ICD-789.01) 11)  Abdominal Pain, Right Upper Quadrant  (ICD-789.01) 12)  Achilles Tendinitis, Bilateral  (ICD-726.71) 13)  Abdominal Pain, Right Upper Quadrant  (ICD-789.01) 14)  Healthy Adult Male  (ICD-V70.0) 15)  Special Screening For Malignant Neoplasms Colon  (ICD-V76.51) 16)  Special Screening Malignant Neoplasm of Prostate  (ICD-V76.44) 17)  Depressive Disorder  (ICD-311) 18)  Erectile Dysfunction  (ICD-302.72) 19)  Obesity  (ICD-278.00) 20)  Family History Diabetes 1st Degree Relative  (ICD-V18.0) 21)  Family History of Cad Male 1st Degree Relative <50  (ICD-V17.3) 22)  Osteoarthritis  (ICD-715.90) 23)  Hypertension  (ICD-401.9) 24)  Hyperlipidemia  (ICD-272.4) 25)  Diabetes Mellitus, Type II  (ICD-250.00) 26)  Coronary Artery Disease  (ICD-414.00) 27)  Hx of Renal Calculus, Hx of  (ICD-V13.01)  Current Medications (verified): 1)  Humulin 70/30 70-30 % Susp (Insulin Isophane & Regular) .... 70 Units Before Breakfast and 70 Units Before Supper 2)  Fluoxetine Hcl 40 Mg Caps (Fluoxetine Hcl) .Marland Kitchen.. 1 Tab By Mouth Daily 3)  Nexium  40 Mg Cpdr (Esomeprazole Magnesium) .... One Tablet By Mouth Twice Daily 30 Min Prior To Breakfast and Dinner 4)  Lisinopril 40 Mg Tabs (Lisinopril) .Marland Kitchen.. 1 By Mouth Daily 5)  Fenofibrate 160 Mg Tabs (Fenofibrate) .Marland Kitchen.. 1 Tab By Mouth Daily 6)  Pravastatin Sodium 20 Mg Tabs (Pravastatin Sodium) .... Take 1 Tablet By Mouth Once A Day 7)  Amlodipine Besylate 10 Mg Tabs (Amlodipine Besylate) .Marland Kitchen.. 1 Tab By Mouth Daily 8)  Meloxicam 15 Mg Tabs (Meloxicam) .... Take 1 Tablet By Mouth Once A Day  Allergies: 1)  ! Lipitor (Atorvastatin Calcium)  Past History:  Past medical,  surgical, family and social histories (including risk factors) reviewed, and no changes noted (except as noted below).  Past Medical History: Reviewed history from 06/19/2009 and no changes required. Coronary artery disease/MILD. Diabetes mellitus, type II/INSULIN DEPENDENT Hyperlipidemia Hypertension Osteoarthritis Depression Kidney Stones Obesity  Past Surgical History: Reviewed history from 07/29/2009 and no changes required. 09/2000: Cardiac Cath  diffuse LAD, 30 % LCA  EF 50-65% 02/2002 back injury: workers comp 11/08 cath:no significant CAD  Unremarkable  Family History: Reviewed history from 07/29/2009 and no changes required. father died age 34 Dm, MI mother age 11 Alzheimer's, emphesema Family History of CAD Male 1st degree relative <50 Family History Diabetes 1st degree relative:Father brother ? neck cancer No FH of Colon Cancer:  Social History: Reviewed history from 03/26/2007 and no changes required. disabled because of back injury finacial concerns wife with depression daughter died 11 months ago Married Never Smoked Alcohol use-no Drug use-no Regular exercise-yes  Review of Systems General:  Complains of fatigue; denies fever. CV:  Complains of chest pain or discomfort. Resp:  Denies shortness of breath. GI:  Complains of abdominal pain, gas, and indigestion; denies bloody stools. GU:  Denies dysuria. MS:  Complains of loss of strength, low back pain, and mid back pain.  Physical Exam  General:  morbidly obese in NAD  Mouth:  MMM Neck:  no carotid bruit or thyromegaly no cervical or supraclavicular lymphadenopathy  Lungs:  normal respiratory effort, no intercostal retractions, and no accessory muscle use.   Heart:  Normal rate and regular rhythm. S1 and S2 normal without gallop, murmur, click, rub or other extra sounds. Abdomen:  severe central obesity, NABS, no ttp, no rebound , no guarding, no hepatomegaly and no splenomegaly.   Msk:  full ROM  neck, neg Spurling's  Pulses:  ttp in right PIP joint 3rd digit  , ttp at base  No redenss, no swelling.  Callus and tender point at base o f right hand.  3rd digit gets stuck in flexed osition  Neg tinel, neg Phaeln. Psych:  Oriented X3, memory intact for recent and remote, normally interactive, and poor eye contact.     Impression & Recommendations:  Problem # 1:  TRIGGER FINGER, RIGHT MIDDLE (ICD-727.03) Use NSAIDs as needed pain. refer to hand specialist for trigger injection.  Orders: Orthopedic Referral (Ortho)  Problem # 2:  HYPERTENSION (ICD-401.9)  Poor control.Marland Kitchen add amlodipine to treat. Contnue lisinopril. Recheck in 2 weeks.  His updated medication list for this problem includes:    Lisinopril 40 Mg Tabs (Lisinopril) .Marland Kitchen... 1 by mouth daily    Amlodipine Besylate 10 Mg Tabs (Amlodipine besylate) .Marland Kitchen... 1 tab by mouth daily  BP today: 150/80 Prior BP: 130/80 (01/08/2010)  Prior 10 Yr Risk Heart Disease: N/A (04/10/2007)  Labs Reviewed: K+: 4.8 (10/06/2009) Creat: : 0.9 (10/06/2009)   Chol: 208 (10/06/2009)   HDL:  36.70 (10/06/2009)   LDL: 142 (07/09/2008)   TG: 167.0 (10/06/2009)  Problem # 3:  DIABETES MELLITUS, TYPE II (ICD-250.00) Poo control... will likely need to try different insulin if pt able to avoid low and still increase to goal. Pt refuses metformin given SE in past.  His updated medication list for this problem includes:    Humulin 70/30 70-30 % Susp (Insulin isophane & regular) .Marland KitchenMarland KitchenMarland KitchenMarland Kitchen 70 units before breakfast and 70 units before supper    Lisinopril 40 Mg Tabs (Lisinopril) .Marland Kitchen... 1 by mouth daily  Problem # 4:  HYPERLIPIDEMIA (ICD-272.4) Restart meds.  His updated medication list for this problem includes:    Fenofibrate 160 Mg Tabs (Fenofibrate) .Marland Kitchen... 1 tab by mouth daily    Pravastatin Sodium 20 Mg Tabs (Pravastatin sodium) .Marland Kitchen... Take 1 tablet by mouth once a day  Problem # 5:  DEPRESSIVE DISORDER (ICD-311) Poor control.. restart fluoxetine.. likely  may need change if no improvement in mood. refuses counsleor.  His updated medication list for this problem includes:    Fluoxetine Hcl 40 Mg Caps (Fluoxetine hcl) .Marland Kitchen... 1 tab by mouth daily  Complete Medication List: 1)  Humulin 70/30 70-30 % Susp (Insulin isophane & regular) .... 70 units before breakfast and 70 units before supper 2)  Fluoxetine Hcl 40 Mg Caps (Fluoxetine hcl) .Marland Kitchen.. 1 tab by mouth daily 3)  Nexium 40 Mg Cpdr (Esomeprazole magnesium) .... One tablet by mouth twice daily 30 min prior to breakfast and dinner 4)  Lisinopril 40 Mg Tabs (Lisinopril) .Marland Kitchen.. 1 by mouth daily 5)  Fenofibrate 160 Mg Tabs (Fenofibrate) .Marland Kitchen.. 1 tab by mouth daily 6)  Pravastatin Sodium 20 Mg Tabs (Pravastatin sodium) .... Take 1 tablet by mouth once a day 7)  Amlodipine Besylate 10 Mg Tabs (Amlodipine besylate) .Marland Kitchen.. 1 tab by mouth daily 8)  Meloxicam 15 Mg Tabs (Meloxicam) .... Take 1 tablet by mouth once a day  Hypertension Assessment/Plan:      [name] hypertensive risk group is category C: Target organ damage and/or diabetes.  Today's blood pressure is 150/80.  His blood pressure goal is < 130/80.  Patient Instructions: 1)  Fasting A1C, CMET, lipids Dx 250.00, B12, vit D, cbc, testosterone Dx 780.79 2)  Restart medications as on previously. 3)   Continue lisinopril and add amlodipine for BP control. 4)   Use meloxicam for pain in hand. 5)  Call if not helping.  6)  Follow up 30 min appt in 2 weeks. multiple medical issues. 7)  Referral Appointment Information 8)  Day/Date: 9)  Time: 10)  Place/MD: 11)  Address: 12)  Phone/Fax: 13)  Patient given appointment information. Information/Orders faxed/mailed.  Prescriptions: MELOXICAM 15 MG TABS (MELOXICAM) Take 1 tablet by mouth once a day  #30 x 0   Entered and Authorized by:   Kerby Nora MD   Signed by:   Kerby Nora MD on 09/14/2010   Method used:   Electronically to        [facility] [address] (retail)       [address],  [address]       [place], [place]  [address]       Ph: [phone]       Fax: [phone]   RxID:   [id number] AMLODIPINE BESYLATE 10 MG TABS (AMLODIPINE BESYLATE) 1 tab by mouth daily  #30 x 11   Entered and Authorized by:   Kerby Nora MD  Signed by:   Kerby Nora MD on 09/14/2010   Method used:   Electronically to        [facility] [address] (retail)       [address]       [place], [place]  [address]       Ph: [phone]       Fax: [phone]   RxID:   [id number]    Orders Added: 1)  Orthopedic Referral [Ortho] 2)  Est. Patient Level IV [[address]]    Current Allergies (reviewed today): ! LIPITOR (ATORVASTATIN CALCIUM)

## 2010-10-05 ENCOUNTER — Encounter: Payer: Self-pay | Admitting: Family Medicine

## 2010-10-06 NOTE — Assessment & Plan Note (Signed)
Summary: 30 MIN APPT 2 WEEK FOLLOW UP MULTIPLE MEDICAL ISSUES/[facility]   Vital Signs:  Patient profile:   60 year old male Height:      68.5 inches Weight:      307.25 pounds BMI:     46.20 Temp:     97.9 degrees F oral Pulse rate:   80 / minute Pulse rhythm:   regular BP sitting:   120 / 70  (left arm) Cuff size:   large  Vitals Entered By: Benny Lennert CMA Duncan Dull) (September 28, 2010 8:57 AM)  History of Present Illness: Chief complaint 2 week follow up   HTN: better control with addition of amlodipine to lisinopril. Now at goal < 130/80. No SE to amlodipine.   High chol: was not at goal, but he is back on simvastatin  DM: poor control. FBS...92-180, lower numbers  if doesn't eat late  was 180 because ate candy bar last night.  70 Units BID   Fatigue: low B12, low vit D, low testosterone.  Will start B12 injections and vit D supplementation  Fatigue improved some since BP lower.  Low testosterone.. eval further with labs... likley willl need Uro referral for testosterone supplementation.  Right hand pain and trigger finger: Never saw ORHTO.  Massaged hand  and using meloxicamand pain went away.   Depression:good days and bad... does not want to restart fluoxetine. Denies SI. Refuses counsling.  Chronic back pain.. meloxicam did not help.  Problems Prior to Update: 1)  Other Malaise and Fatigue  (ICD-780.79) 2)  Trigger Finger, Right Middle  (ICD-727.03) 3)  Dermatitis  (ICD-692.9) 4)  Branch Retinal Vein Occlusion  (ICD-362.36) 5)  Leg Pain, Bilateral  (ICD-729.5) 6)  Constipation  (ICD-564.00) 7)  Gastritis  (ICD-535.50) 8)  Screening, Colon Cancer  (ICD-V76.51) 9)  Chest Pain  (ICD-786.50) 10)  Early Satiety  (ICD-780.94) 11)  Abdominal Pain Right Upper Quadrant  (ICD-789.01) 12)  Abdominal Pain, Right Upper Quadrant  (ICD-789.01) 13)  Achilles Tendinitis, Bilateral  (ICD-726.71) 14)  Abdominal Pain, Right Upper Quadrant  (ICD-789.01) 15)  Healthy Adult Male   (ICD-V70.0) 32)  Special Screening For Malignant Neoplasms Colon  (ICD-V76.51) 17)  Special Screening Malignant Neoplasm of Prostate  (ICD-V76.44) 18)  Depressive Disorder  (ICD-311) 19)  Erectile Dysfunction  (ICD-302.72) 20)  Obesity  (ICD-278.00) 21)  Family History Diabetes 1st Degree Relative  (ICD-V18.0) 22)  Family History of Cad Male 1st Degree Relative <50  (ICD-V17.3) 23)  Osteoarthritis  (ICD-715.90) 24)  Hypertension  (ICD-401.9) 25)  Hyperlipidemia  (ICD-272.4) 26)  Diabetes Mellitus, Type II  (ICD-250.00) 27)  Coronary Artery Disease  (ICD-414.00) 28)  Hx of Renal Calculus, Hx of  (ICD-V13.01)  Current Medications (verified): 1)  Humulin 70/30 70-30 % Susp (Insulin Isophane & Regular) .... 70 Units Before Breakfast and 70 Units Before Supper 2)  Nexium 40 Mg Cpdr (Esomeprazole Magnesium) .... One Tablet By Mouth Twice Daily 30 Min Prior To Breakfast and Dinner 3)  Lisinopril 40 Mg Tabs (Lisinopril) .Marland Kitchen.. 1 By Mouth Daily 4)  Fenofibrate 160 Mg Tabs (Fenofibrate) .Marland Kitchen.. 1 Tab By Mouth Daily 5)  Pravastatin Sodium 20 Mg Tabs (Pravastatin Sodium) .... Take 1 Tablet By Mouth Once A Day 6)  Amlodipine Besylate 10 Mg Tabs (Amlodipine Besylate) .Marland Kitchen.. 1 Tab By Mouth Daily 7)  Vitamin D (Ergocalciferol) 50000 Unit Caps (Ergocalciferol) .Marland Kitchen.. 1 Tab By Mouth Weekly X 12 Weeks 8)  Hydrocodone-Acetaminophen 5-500 Mg Tabs (Hydrocodone-Acetaminophen) .Marland Kitchen.. 1 Tab By Mouth Daily As Needed  For Pain  Allergies: 1)  ! Lipitor (Atorvastatin Calcium)  Past History:  Past medical, surgical, family and social histories (including risk factors) reviewed, and no changes noted (except as noted below).  Past Medical History: Reviewed history from 06/19/2009 and no changes required. Coronary artery disease/MILD. Diabetes mellitus, type II/INSULIN DEPENDENT Hyperlipidemia Hypertension Osteoarthritis Depression Kidney Stones Obesity  Past Surgical History: Reviewed history from 07/29/2009 and no  changes required. 09/2000: Cardiac Cath  diffuse LAD, 30 % LCA  EF 50-65% 02/2002 back injury: workers comp 11/08 cath:no significant CAD  Unremarkable  Family History: Reviewed history from 07/29/2009 and no changes required. father died age 3 Dm, MI mother age 29 Alzheimer's, emphesema Family History of CAD Male 1st degree relative <50 Family History Diabetes 1st degree relative:Father brother ? neck cancer No FH of Colon Cancer:  Social History: Reviewed history from 03/26/2007 and no changes required. disabled because of back injury finacial concerns wife with depression daughter died 97 months ago Married Never Smoked Alcohol use-no Drug use-no Regular exercise-yes  Physical Exam  General:  morbidly obese in NAD  Mouth:  MMM Neck:  no carotid bruit or thyromegaly no cervical or supraclavicular lymphadenopathy  Lungs:  normal respiratory effort, no intercostal retractions, and no accessory muscle use.   Heart:  Normal rate and regular rhythm. S1 and S2 normal without gallop, murmur, click, rub or other extra sounds. Pulses:  R and L posterior tibial pulses are full and equal bilaterally  Extremities:  trace left pedal edema and trace right pedal edema.   Skin:  Intact without suspicious lesions or rashes Psych:  Oriented X3, memory intact for recent and remote, normally interactive, good eye contact, not anxious appearing, and not depressed appearing.     Impression & Recommendations:  Problem # 1:  DIABETES MELLITUS, TYPE II (ICD-250.00) Assessment Unchanged Pt remarks that he is ready to get back on his diet and will be able exercsie more now that he is having more energy... Hopefully with vit supplementation he will improve more with his energy as well.  We will keep 70/30 the same for now... because with diet changes we don't want him to drop too low. His updated medication list for this problem includes:    Humulin 70/30 70-30 % Susp (Insulin isophane & regular)  .Marland KitchenMarland KitchenMarland KitchenMarland Kitchen 70 units before breakfast and 70 units before supper    Lisinopril 40 Mg Tabs (Lisinopril) .Marland Kitchen... 1 by mouth daily  Problem # 2:  HYPERTENSION (ICD-401.9) Assessment: Improved Improved control on current regimen. Encouraged exercise, weight loss, healthy eating habits.  His updated medication list for this problem includes:    Lisinopril 40 Mg Tabs (Lisinopril) .Marland Kitchen... 1 by mouth daily    Amlodipine Besylate 10 Mg Tabs (Amlodipine besylate) .Marland Kitchen... 1 tab by mouth daily  Problem # 3:  VITAMIN B12 DEFICIENCY (ICD-266.2) Assessment: New Replete.  Problem # 4:  HYPOGONADISM (ICD-257.2) Assessment: New Eval further. May be candidate for testosterone supplementation. Orders: TLB-TSH (Thyroid Stimulating Hormone) (84443-TSH) TLB-Luteinizing Hormone (LH) (83002-LH) TLB-FSH (Follicle Stimulating Hormone) (83001-FSH) T-Prolactin (16109-60454) TLB-Testosterone, Total (84403-TESTO)  Problem # 5:  UNSPECIFIED VITAMIN D DEFICIENCY (ICD-268.9) Assessment: New Replace.  Problem # 6:  TRIGGER FINGER, RIGHT MIDDLE (ICD-727.03) resolved   Problem # 7:  LEG PAIN, BILATERAL (ICD-729.5) ? due to low vit D. Continued despite being off statin.  Vicodin as needed pain  given no improvement with meloxicam and history of gastritis.  Problem # 8:  HYPERLIPIDEMIA (ICD-272.4) Restart chol meds.  His updated medication  list for this problem includes:    Fenofibrate 160 Mg Tabs (Fenofibrate) .Marland Kitchen... 1 tab by mouth daily    Pravastatin Sodium 20 Mg Tabs (Pravastatin sodium) .Marland Kitchen... Take 1 tablet by mouth once a day  Complete Medication List: 1)  Humulin 70/30 70-30 % Susp (Insulin isophane & regular) .... 70 units before breakfast and 70 units before supper 2)  Nexium 40 Mg Cpdr (Esomeprazole magnesium) .... One tablet by mouth twice daily 30 min prior to breakfast and dinner 3)  Lisinopril 40 Mg Tabs (Lisinopril) .Marland Kitchen.. 1 by mouth daily 4)  Fenofibrate 160 Mg Tabs (Fenofibrate) .Marland Kitchen.. 1 tab by mouth daily 5)   Pravastatin Sodium 20 Mg Tabs (Pravastatin sodium) .... Take 1 tablet by mouth once a day 6)  Amlodipine Besylate 10 Mg Tabs (Amlodipine besylate) .Marland Kitchen.. 1 tab by mouth daily 7)  Vitamin D (ergocalciferol) 50000 Unit Caps (Ergocalciferol) .Marland Kitchen.. 1 tab by mouth weekly x 12 weeks 8)  Hydrocodone-acetaminophen 5-500 Mg Tabs (Hydrocodone-acetaminophen) .Marland Kitchen.. 1 tab by mouth daily as needed for pain  Patient Instructions: 1)  Continue new BP meds. 2)   Restart pravastain ...cholesterol med. 3)  Start monthly B12 injections... schedule next B12 inj with nurse visit. 4)  Start vit D supplement. 5)  We will call with testosterone lab results. 6)  Decrease carbs in diet. Start regular exercise. 7)   Follow up in 2 months with fasting labs prior Dx 250.00 A1C, lipids. Prescriptions: HYDROCODONE-ACETAMINOPHEN 5-500 MG TABS (HYDROCODONE-ACETAMINOPHEN) 1 tab by mouth daily as needed for pain  #30 x 0   Entered and Authorized by:   Kerby Nora MD   Signed by:   Kerby Nora MD on 09/28/2010   Method used:   Print then Give to Patient   RxID:   [id number] PRAVASTATIN SODIUM 20 MG TABS (PRAVASTATIN SODIUM) Take 1 tablet by mouth once a day  #30 x 11   Entered and Authorized by:   Kerby Nora MD   Signed by:   Kerby Nora MD on 09/28/2010   Method used:   Electronically to        [facility] [address] (retail)       [address], [address]       [place], [place]  [address]       Ph: [phone]       Fax: [phone]   RxID:   [id number] FENOFIBRATE 160 MG TABS (FENOFIBRATE) 1 tab by mouth daily  #30 x 11   Entered and Authorized by:   Kerby Nora MD   Signed by:   Kerby Nora MD on 09/28/2010   Method used:   Electronically to        [facility] [address] (retail)       [address], [address]       [place], [place]  [address]       Ph: [phone]       Fax: [phone]   RxID:   [id number] VITAMIN D (ERGOCALCIFEROL)  50000 UNIT CAPS (ERGOCALCIFEROL) 1 tab by mouth weekly x 12 weeks  #12 x 0   Entered and Authorized by:   Kerby Nora MD   Signed by:   Kerby Nora MD on 09/28/2010   Method used:   Electronically to        [facility] [address] (retail)       [address], [address]       [place], [place]  [address]       Ph: [phone]       Fax: [phone]   RxID:   [id number]    Orders Added: 1)  TLB-TSH (Thyroid Stimulating Hormone) [84443-TSH] 2)  TLB-Luteinizing Hormone (LH) [83002-LH] 3)  TLB-FSH (Follicle Stimulating Hormone) [83001-FSH] 4)  T-Prolactin [40102-72536] 5)  TLB-Testosterone, Total [84403-TESTO] 6)  Est. Patient Level IV [64403]    Current Allergies (reviewed today): ! LIPITOR (ATORVASTATIN CALCIUM)  Appended Document: 30 MIN APPT 2 WEEK FOLLOW UP MULTIPLE MEDICAL ISSUES/[facility]    Clinical Lists Changes  Orders: Added new Service order of Vit B12 1000 mcg (K7425) - Signed Added new Service order of Admin of Therapeutic Inj  intramuscular or subcutaneous (95638) - Signed       Medication Administration  Injection # 1:    Medication: Vit B12 1000 mcg    Diagnosis: VITAMIN B12 DEFICIENCY (ICD-266.2)    Route: IM    Site: L deltoid    Exp Date: 02/27/2012    Lot #: [id number]    Mfr: [employer] Regent    Patient tolerated injection without complications    Given by: Benny Lennert CMA (AAMA) (September 28, 2010 10:25 AM)  Orders Added: 1)  Vit B12 1000 mcg [J3420] 2)  Admin of Therapeutic Inj  intramuscular or subcutaneous [95188]

## 2010-10-27 ENCOUNTER — Encounter: Payer: Self-pay | Admitting: Family Medicine

## 2010-10-27 ENCOUNTER — Ambulatory Visit (INDEPENDENT_AMBULATORY_CARE_PROVIDER_SITE_OTHER): Payer: Medicare Other

## 2010-10-27 DIAGNOSIS — E538 Deficiency of other specified B group vitamins: Secondary | ICD-10-CM

## 2010-11-04 NOTE — Assessment & Plan Note (Signed)
Summary: B-12 JRT Bronson Bressman  Nurse Visit   Allergies: 1)  ! Lipitor (Atorvastatin Calcium)  Medication Administration  Injection # 1:    Medication: Vit B12 1000 mcg    Diagnosis: VITAMIN B12 DEFICIENCY (ICD-266.2)    Route: IM    Site: R deltoid    Exp Date: 03/28/2012    Lot #: [id number]    Mfr: American Regent    Patient tolerated injection without complications    Given by: Delilah Shan CMA Duncan Dull) (October 27, 2010 10:58 AM)  Orders Added: 1)  Admin of Therapeutic Inj  intramuscular or subcutaneous [96372] 2)  Vit B12 1000 mcg [J3420]   Medication Administration  Injection # 1:    Medication: Vit B12 1000 mcg    Diagnosis: VITAMIN B12 DEFICIENCY (ICD-266.2)    Route: IM    Site: R deltoid    Exp Date: 03/28/2012    Lot #: [id number]    Mfr: American Regent    Patient tolerated injection without complications    Given by: Delilah Shan CMA (AAMA) (October 27, 2010 10:58 AM)  Orders Added: 1)  Admin of Therapeutic Inj  intramuscular or subcutaneous [96372] 2)  Vit B12 1000 mcg [J3420]

## 2010-11-13 LAB — BASIC METABOLIC PANEL
BUN: 15 mg/dL (ref 6–23)
CO2: 25 mEq/L (ref 19–32)
Calcium: 9 mg/dL (ref 8.4–10.5)
Chloride: 102 mEq/L (ref 96–112)
Creatinine, Ser: 0.84 mg/dL (ref 0.4–1.5)
GFR calc Af Amer: >60 mL/min (ref >60)
GFR calc non Af Amer: >60 mL/min (ref >60)
Glucose, Bld: 306 mg/dL — ABNORMAL HIGH (ref 70–99)
Potassium: 4.1 mEq/L (ref 3.5–5.1)
Sodium: 136 mEq/L (ref 135–145)

## 2010-11-13 LAB — TROPONIN I: Troponin I: 0.05 ng/mL (ref 0.00–0.06)

## 2010-11-13 LAB — CBC
HCT: 40 % (ref 39.0–52.0)
Hemoglobin: 14 g/dL (ref 13.0–17.0)
MCHC: 35 g/dL (ref 30.0–36.0)
MCV: 87.5 fL (ref 78.0–100.0)
Platelets: 209 10*3/uL (ref 150–400)
RBC: 4.57 MIL/uL (ref 4.22–5.81)
RDW: 13.6 % (ref 11.5–15.5)
WBC: 5.5 10*3/uL (ref 4.0–10.5)

## 2010-11-13 LAB — DIFFERENTIAL
Basophils Absolute: 0 10*3/uL (ref 0.0–0.1)
Basophils Relative: 1 % (ref 0–1)
Eosinophils Absolute: 0 10*3/uL (ref 0.0–0.7)
Eosinophils Relative: 1 % (ref 0–5)
Lymphocytes Relative: 25 % (ref 12–46)
Lymphs Abs: 1.4 10*3/uL (ref 0.7–4.0)
Monocytes Absolute: 0.3 10*3/uL (ref 0.1–1.0)
Monocytes Relative: 6 % (ref 3–12)
Neutro Abs: 3.7 10*3/uL (ref 1.7–7.7)
Neutrophils Relative %: 68 % (ref 43–77)

## 2010-11-13 LAB — BRAIN NATRIURETIC PEPTIDE: Pro B Natriuretic peptide (BNP): 45 pg/mL (ref 0.0–100.0)

## 2010-11-13 LAB — GLUCOSE, CAPILLARY: Glucose-Capillary: 288 mg/dL — ABNORMAL HIGH (ref 70–99)

## 2010-11-14 LAB — GLUCOSE, CAPILLARY
Glucose-Capillary: 190 mg/dL — ABNORMAL HIGH (ref 70–99)
Glucose-Capillary: 195 mg/dL — ABNORMAL HIGH (ref 70–99)

## 2010-11-16 ENCOUNTER — Other Ambulatory Visit (INDEPENDENT_AMBULATORY_CARE_PROVIDER_SITE_OTHER): Payer: Medicare Other | Admitting: Family Medicine

## 2010-11-16 DIAGNOSIS — E119 Type 2 diabetes mellitus without complications: Secondary | ICD-10-CM

## 2010-11-16 LAB — LIPID PANEL
Cholesterol: 161 mg/dL (ref 0–200)
HDL: 27.6 mg/dL — ABNORMAL LOW (ref >39.00)
LDL Cholesterol: 101 mg/dL — ABNORMAL HIGH (ref 0–99)
Total CHOL/HDL Ratio: 6
Triglycerides: 161 mg/dL — ABNORMAL HIGH (ref 0.0–149.0)
VLDL: 32.2 mg/dL (ref 0.0–40.0)

## 2010-11-16 LAB — HEMOGLOBIN A1C: Hgb A1c MFr Bld: 9.7 % — ABNORMAL HIGH (ref 4.6–6.5)

## 2010-11-16 NOTE — Progress Notes (Signed)
Addended byMills Koller on: 11/16/2010 10:22 AM   Modules accepted: Orders

## 2010-11-22 ENCOUNTER — Ambulatory Visit: Payer: Self-pay | Admitting: Family Medicine

## 2010-11-23 ENCOUNTER — Encounter: Payer: Self-pay | Admitting: Family Medicine

## 2010-11-23 ENCOUNTER — Ambulatory Visit (INDEPENDENT_AMBULATORY_CARE_PROVIDER_SITE_OTHER): Payer: Medicare Other | Admitting: Family Medicine

## 2010-11-23 DIAGNOSIS — E538 Deficiency of other specified B group vitamins: Secondary | ICD-10-CM

## 2010-11-23 DIAGNOSIS — I10 Essential (primary) hypertension: Secondary | ICD-10-CM

## 2010-11-23 DIAGNOSIS — E785 Hyperlipidemia, unspecified: Secondary | ICD-10-CM

## 2010-11-23 DIAGNOSIS — E291 Testicular hypofunction: Secondary | ICD-10-CM

## 2010-11-23 DIAGNOSIS — M79609 Pain in unspecified limb: Secondary | ICD-10-CM

## 2010-11-23 DIAGNOSIS — E119 Type 2 diabetes mellitus without complications: Secondary | ICD-10-CM

## 2010-11-23 MED ORDER — CYANOCOBALAMIN 1000 MCG/ML IJ SOLN
1000.0000 ug | INTRAMUSCULAR | Status: AC
  Administered 2010-11-23: 1000 ug via INTRAMUSCULAR

## 2010-11-23 MED ORDER — CYANOCOBALAMIN 1000 MCG/ML IJ SOLN: 1000.0000 ug | INTRAMUSCULAR | Status: DC

## 2010-11-23 MED ORDER — INSULIN GLARGINE 100 UNIT/ML ~~LOC~~ SOLN: 70.0000 [IU] | Freq: Every day | SUBCUTANEOUS | Status: DC

## 2010-11-23 NOTE — Assessment & Plan Note (Signed)
Well controlled. Continue current medication.  

## 2010-11-23 NOTE — Assessment & Plan Note (Signed)
Poor control.  Will stop 70/30 and change to Lantus in AMs.  Titrate up  till fasting CBGs are <120.  Will likely need to add rapid acting insulin with meals. Follow up closely in 1 month.  Encouraged exercise, weight loss, healthy eating habits.

## 2010-11-23 NOTE — Assessment & Plan Note (Signed)
LDL almost at goal < 100 back on simvatatin.  Triglycerides increased some from last OV... On fenofibrate. Liver function stable last check.

## 2010-11-23 NOTE — Patient Instructions (Addendum)
Make appt for B12 injection monthy. Stop to speak with Shirlee Limerick on way out to set up referral to URO.  Work on low fat, low carb diet.. Try to start exercsie regimen of 3 days a week for 30 in at least.   Start Lantus 70 Units daily in AM.  Stop 70/30. Check fasting blood sugar each morning.. If blood sugar >120  For 2 days .Marland Kitchen Increase Lantus 2 UNits. Check in evening 2 hours after dinner. Call if blood sugar >350. Record blood sugar measurements.

## 2010-11-23 NOTE — Progress Notes (Signed)
  Subjective:    Patient ID: [name], male    DOB: 1951-07-26, 60 y.o.   MRN: [id number]  HPI HTN: better control with addition of amlodipine to lisinopril.  Now at goal < 130/80.  No SE to amlodipine.   High chol: improved back on simvastatin   DM: Continued  poor control.  FBS...92-180, lower numbers if doesn't eat late  Later in the day .Marland Kitchen This is when he has his higher numbers...327...4PM Sneaking a lot of candy and bread. No lows less than 60. 70 Units BID takes 8 AM and 8 PM  Fatigue: low B12, low vit D, low testosterone.   On B12 injections and vit D supplementation  Fatigue improved some since BP lower.   Low testosterone.. Further eval revealed persistantly low testosterone in 200s.. nml prolactin, TSh, FSH and LH. Offered URO referral for supplementation.Marland KitchenMarland KitchenNow intersted in moving forward with this... "anything to help with energy."  Depression:good days and bad... does not want to restart fluoxetine. Denies SI. Refuses counsling.   Chronic back pain.. Tolerable at this time. Per pt vicodin did not help so he is not taking this.  B leg pain, improved on its own per pt with PT:He only has pain in ankles when wearing boots. Requested diabetic shoe prescription today.    Review of Systems  Constitutional: Positive for fatigue. Negative for fever and appetite change.  Respiratory: Negative for cough and shortness of breath.   Cardiovascular: Negative for chest pain, palpitations and leg swelling.  Gastrointestinal: Negative for abdominal pain and abdominal distention.  Genitourinary: Negative for dysuria, urgency, penile swelling, scrotal swelling, penile pain and testicular pain.  Musculoskeletal: Positive for back pain.  Neurological: Negative for syncope, weakness and numbness.       Objective:   Physical Exam  Vitals reviewed. Constitutional: He is oriented to person, place, and time. He appears well-developed.  HENT:  Head: Normocephalic.  Neck: Normal  range of motion. Neck supple. No thyromegaly present.  Cardiovascular: Normal rate, regular rhythm, normal heart sounds and intact distal pulses.  Exam reveals no gallop and no friction rub.   No murmur heard. Pulmonary/Chest: Effort normal and breath sounds normal. No respiratory distress. He has no wheezes. He has no rales.  Abdominal: Soft. Bowel sounds are normal. There is no tenderness.  Musculoskeletal:       Right foot: Normal.       Left foot: Normal.       nml monofilament test B, no lesions, no lacerations.  Toenails thickened B.  Lymphadenopathy:    He has no cervical adenopathy.  Neurological: He is alert and oriented to person, place, and time.  Skin: Skin is warm and dry.  Psychiatric: He has a normal mood and affect. His behavior is normal.          Assessment & Plan:

## 2010-11-23 NOTE — Assessment & Plan Note (Signed)
Resolved

## 2010-11-23 NOTE — Assessment & Plan Note (Signed)
Refer to URO for testosterone supplementation.

## 2010-11-26 ENCOUNTER — Telehealth: Payer: Self-pay | Admitting: *Deleted

## 2010-11-26 NOTE — Telephone Encounter (Signed)
Form for diabetic supplies is on your desk.  This is a renewal.

## 2010-11-29 ENCOUNTER — Telehealth: Payer: Self-pay | Admitting: *Deleted

## 2010-11-29 MED ORDER — INSULIN NPH ISOPHANE & REGULAR (70-30) 100 UNIT/ML ~~LOC~~ SUSP: 70.0000 [IU] | Freq: Two times a day (BID) | SUBCUTANEOUS | Status: DC

## 2010-11-29 NOTE — Telephone Encounter (Signed)
Has pt been notified of Dr. Brayton Layman recommendations yet?... If not... Have him first try Lantus 100 Units daily and I will call him in a fast acting insulin to take with the largest meal of the day.. Let me know so I can do this ASAP.

## 2010-11-29 NOTE — Telephone Encounter (Signed)
Pt states his blood sugar has gone up on lantus and he is asking what to do.  He is up to 80 units in the morning.  BS last night was 474, this morning it was 302.  Please advise, pt knows you are out today.

## 2010-11-29 NOTE — Telephone Encounter (Signed)
BS has been > 350 and sometimes > 400 and went to [facility]  Acute change when change from 70/30 to Lantus.  Recommendation: change back to Novolin 70/30 now, 70 units bid   If Lantus is desired, consider tid Novolog or Humalog as well. Cc: Dr. Ermalene Searing

## 2010-11-30 ENCOUNTER — Other Ambulatory Visit: Payer: Self-pay | Admitting: *Deleted

## 2010-11-30 MED ORDER — INSULIN NPH ISOPHANE & REGULAR (70-30) 100 UNIT/ML ~~LOC~~ SUSP: SUBCUTANEOUS | Status: DC

## 2010-11-30 NOTE — Telephone Encounter (Signed)
Patient advised as instructed via telephone.  Med list updated.

## 2010-11-30 NOTE — Telephone Encounter (Signed)
Patient advised as instructed via telephone.  He stated that his blood sugar was 162 this morning and that is before he had eating anything.  He is taking Humalog 70/30 twice daily.  Please advise.

## 2010-11-30 NOTE — Telephone Encounter (Signed)
Dr Bedsole patient.  

## 2010-11-30 NOTE — Telephone Encounter (Signed)
Okay.. Pt back on 70/30. Have him increase to  75 Units 70/30  BID.  Have him call before end of week with Wed and Thursday's fasting CBGs and blood sugar 2 hours after dinner.  Please change in EMR.

## 2010-12-02 LAB — GLUCOSE, CAPILLARY
Glucose-Capillary: 220 mg/dL — ABNORMAL HIGH (ref 70–99)
Glucose-Capillary: 224 mg/dL — ABNORMAL HIGH (ref 70–99)

## 2010-12-03 LAB — URINALYSIS, ROUTINE W REFLEX MICROSCOPIC
Bilirubin Urine: NEGATIVE
Glucose, UA: 500 mg/dL — AB
Hgb urine dipstick: NEGATIVE
Ketones, ur: NEGATIVE mg/dL
Nitrite: NEGATIVE
Protein, ur: NEGATIVE mg/dL
Specific Gravity, Urine: 1.025 (ref 1.005–1.030)
Urobilinogen, UA: 1 mg/dL (ref 0.0–1.0)
pH: 5.5 (ref 5.0–8.0)

## 2010-12-03 LAB — COMPREHENSIVE METABOLIC PANEL
ALT: 17 U/L (ref 0–53)
AST: 19 U/L (ref 0–37)
Albumin: 3.5 g/dL (ref 3.5–5.2)
Alkaline Phosphatase: 88 U/L (ref 39–117)
BUN: 14 mg/dL (ref 6–23)
CO2: 25 mEq/L (ref 19–32)
Calcium: 8.9 mg/dL (ref 8.4–10.5)
Chloride: 105 mEq/L (ref 96–112)
Creatinine, Ser: 0.74 mg/dL (ref 0.4–1.5)
GFR calc Af Amer: >60 mL/min (ref >60)
GFR calc non Af Amer: >60 mL/min (ref >60)
Glucose, Bld: 188 mg/dL — ABNORMAL HIGH (ref 70–99)
Potassium: 3.7 mEq/L (ref 3.5–5.1)
Sodium: 137 mEq/L (ref 135–145)
Total Bilirubin: 0.6 mg/dL (ref 0.3–1.2)
Total Protein: 6.9 g/dL (ref 6.0–8.3)

## 2010-12-03 LAB — BRAIN NATRIURETIC PEPTIDE: Pro B Natriuretic peptide (BNP): 55 pg/mL (ref 0.0–100.0)

## 2010-12-03 LAB — LIPID PANEL
Cholesterol: 207 mg/dL — ABNORMAL HIGH (ref 0–200)
HDL: 34 mg/dL — ABNORMAL LOW (ref >39)
LDL Cholesterol: 146 mg/dL — ABNORMAL HIGH (ref 0–99)
Total CHOL/HDL Ratio: 6.1 RATIO
Triglycerides: 134 mg/dL (ref <150)
VLDL: 27 mg/dL (ref 0–40)

## 2010-12-03 LAB — DIFFERENTIAL
Basophils Absolute: 0 10*3/uL (ref 0.0–0.1)
Basophils Relative: 0 % (ref 0–1)
Eosinophils Absolute: 0.1 10*3/uL (ref 0.0–0.7)
Eosinophils Relative: 1 % (ref 0–5)
Lymphocytes Relative: 26 % (ref 12–46)
Lymphs Abs: 1.5 10*3/uL (ref 0.7–4.0)
Monocytes Absolute: 0.3 10*3/uL (ref 0.1–1.0)
Monocytes Relative: 6 % (ref 3–12)
Neutro Abs: 3.7 10*3/uL (ref 1.7–7.7)
Neutrophils Relative %: 66 % (ref 43–77)

## 2010-12-03 LAB — PROTIME-INR
INR: 1 (ref 0.00–1.49)
Prothrombin Time: 12.9 seconds (ref 11.6–15.2)

## 2010-12-03 LAB — APTT: aPTT: 27 seconds (ref 24–37)

## 2010-12-03 LAB — POCT CARDIAC MARKERS
CKMB, poc: 1.7 ng/mL (ref 1.0–8.0)
Myoglobin, poc: 74.1 ng/mL (ref 12–200)
Troponin i, poc: <0.05 ng/mL (ref 0.00–0.09)

## 2010-12-03 LAB — CBC
HCT: 39.1 % (ref 39.0–52.0)
HCT: 39.8 % (ref 39.0–52.0)
Hemoglobin: 13.4 g/dL (ref 13.0–17.0)
Hemoglobin: 13.8 g/dL (ref 13.0–17.0)
MCHC: 34.2 g/dL (ref 30.0–36.0)
MCHC: 34.5 g/dL (ref 30.0–36.0)
MCV: 87.6 fL (ref 78.0–100.0)
MCV: 88 fL (ref 78.0–100.0)
Platelets: 195 10*3/uL (ref 150–400)
Platelets: 199 10*3/uL (ref 150–400)
RBC: 4.44 MIL/uL (ref 4.22–5.81)
RBC: 4.55 MIL/uL (ref 4.22–5.81)
RDW: 13.7 % (ref 11.5–15.5)
RDW: 14.1 % (ref 11.5–15.5)
WBC: 5.7 10*3/uL (ref 4.0–10.5)
WBC: 7.1 10*3/uL (ref 4.0–10.5)

## 2010-12-03 LAB — TSH: TSH: 1.597 u[IU]/mL (ref 0.350–4.500)

## 2010-12-03 LAB — CARDIAC PANEL(CRET KIN+CKTOT+MB+TROPI)
CK, MB: 1.9 ng/mL (ref 0.3–4.0)
Relative Index: 1.5 (ref 0.0–2.5)
Total CK: 124 U/L (ref 7–232)
Troponin I: 0.02 ng/mL (ref 0.00–0.06)

## 2010-12-03 LAB — GLUCOSE, CAPILLARY
Glucose-Capillary: 121 mg/dL — ABNORMAL HIGH (ref 70–99)
Glucose-Capillary: 214 mg/dL — ABNORMAL HIGH (ref 70–99)
Glucose-Capillary: 276 mg/dL — ABNORMAL HIGH (ref 70–99)
Glucose-Capillary: 76 mg/dL (ref 70–99)

## 2010-12-03 LAB — HEMOGLOBIN A1C
Hgb A1c MFr Bld: 9.5 % — ABNORMAL HIGH (ref 4.6–6.1)
Mean Plasma Glucose: 226 mg/dL

## 2010-12-03 LAB — HEPARIN LEVEL (UNFRACTIONATED)
Heparin Unfractionated: 0.34 IU/mL (ref 0.30–0.70)
Heparin Unfractionated: 0.35 IU/mL (ref 0.30–0.70)

## 2010-12-13 ENCOUNTER — Other Ambulatory Visit: Payer: Self-pay | Admitting: Family Medicine

## 2010-12-30 ENCOUNTER — Other Ambulatory Visit: Payer: Self-pay | Admitting: *Deleted

## 2010-12-30 MED ORDER — LISINOPRIL 40 MG PO TABS: 40.0000 mg | ORAL_TABLET | Freq: Every day | ORAL | Status: DC

## 2011-01-04 ENCOUNTER — Ambulatory Visit: Payer: Medicare Other | Admitting: Family Medicine

## 2011-01-11 ENCOUNTER — Other Ambulatory Visit: Payer: Self-pay | Admitting: Family Medicine

## 2011-01-11 NOTE — H&P (Signed)
NAMEMarland [name]  GEOFFRY, BANNISTER NO.:  [id number]   MEDICAL RECORD NO.:  [id number]          PATIENT TYPE:  INP   LOCATION:  3707                         FACILITY:  [facility]   PHYSICIAN:  Gordy Savers, MDDATE OF BIRTH:  [birth year]   DATE OF ADMISSION:  06/29/2007  DATE OF DISCHARGE:                              HISTORY & PHYSICAL   CHIEF COMPLAINT:  Chest pain.   HISTORY OF PRESENT ILLNESS:  The patient is a 60 year old gentleman with  multiple cardiac risk factors who was stable until about 8 p.m. the  night prior to admission.  At that time he developed lower mid chest  pain described as a severe ache.  This was associated with nausea,  diaphoresis and persisted for several hours.  He presented to the  emergency room where he was given nitroglycerin with alleviation of the  pain.  He was also treated with morphine with final resolution of the  pain.  Cardiac markers were obtained and were negative as was a resting  EKG.  The patient was admitted to the hospital in July of 2002 for  suspected unstable angina.  The cardiac catheterization at that time  revealed nonobstructive coronary artery disease.  The patient is now  admitted for further evaluation and treatment of his chest pain  syndrome.   PAST MEDICAL HISTORY:  Medical problems include longstanding  hypertension, diabetes and dyslipidemia.  He has had no hospitalizations  other than his admission in 2002 for his heart catheterization.  There  is no history of gallbladder or peptic ulcer disease.   SOCIAL HISTORY:  Married, nonsmoker.   FAMILY HISTORY:  Father died age 11 of complications of diabetes,  details unknown.  Mother in her early 71's in good health.   PHYSICAL EXAMINATION:  GENERAL:  Examination revealed an overweight male  slightly sedated due to analgesic in no acute distress.  VITAL SIGNS:  O2 saturation 98%, blood pressure 150/80.  SKIN:  Warm and dry without rash.  NECK:  Fundi, ears, nose  and throat clear.  NECK:  Short and stocky.  No bruits appreciated.  No neck vein  distention.  CHEST:  Clear.  CARDIOVASCULAR:  Exam revealed normal S1, S2.  There is suggestion of  some anterior chest wall tenderness to gentle palpation.  ABDOMEN:  Obese, soft and nontender.  No organomegaly.  Bowel sounds  normal.  No bruits appreciated.  EXTREMITIES:  Revealed no edema.  Peripheral pulses were intact.   IMPRESSION:  Chest pain syndrome in a patient with multiple cardiac risk  factors.   DISPOSITION:  The patient will be admitted to a telemetry setting.  Cardiac enzymes will be cycled.  He will be set up for an abdominal  ultrasound this morning since he is fasting.  If the patient remains  clinically stable and enzymes remain negative will consider early  discharge and an outpatient stress Myoview.      Gordy Savers, MD  Electronically Signed     PFK/MEDQ  D:  06/29/2007  T:  06/29/2007  Job:  [id number]

## 2011-01-11 NOTE — Consult Note (Signed)
NAME:  [name], [name] NO.:  [id number]   MEDICAL RECORD NO.:  [id number]          PATIENT TYPE:  INP   LOCATION:  3707                         FACILITY:  [facility]   PHYSICIAN:  Bevelyn Buckles. Bensimhon, MDDATE OF BIRTH:  [birth year]   DATE OF CONSULTATION:  06/29/2007  DATE OF DISCHARGE:                                 CONSULTATION   CHIEF COMPLAINT:  Chest pain.   HISTORY OF PRESENT ILLNESS:  This is a 60 year old obese Caucasian male  with known nonobstructive coronary artery disease per cardiac  catheterization in 2002, not well controlled hypertension or diabetes  and also with a history of dyslipidemia.  The patient during dinner last  nigh felt severe chest discomfort epigastric to substernal, thought it  was indigestion radiating into his back with associated nausea,  lightheadedness and mild dyspnea on exertion.  He described it as 10/10  on the pain scale.  He drove himself to the emergency room.  He was  mildly diaphoretic.  He was given nitroglycerin x3, and the pain went  away.  The patient states that he has not felt pain like this before.  The pain was recurrent throughout the night, and nitroglycerin did help  sublingually.  He was given morphine which did not help.   The patient denies any medical noncompliance.  He admits to dietary  noncompliance with diabetic diet.  He does not check his blood glucose  at home.  He has a limited income but does have some assistance.  His  daughter also states that he has been depressed lately along with his  wife over the last year since the death of their daughter.  Apparently  the daughter had spina bifida and died approximately 15 months ago from  renal failure.  Apparently he has not gotten over it and has not been  taking care of himself.  His wife has been neglecting herself as well.  The patient states his wife has just recently been admitted to the  hospital 2 weeks ago with chest pain, and she has a history of  coronary  artery disease as well.  [name] daughter has become concerned  about his and his wife's health and their neglect of their health and  monitoring of their glucose as well as medications.   REVIEW OF SYSTEMS:  Positive for sweats, chest pain, shortness of  breath, mild nausea, otherwise negative.   PAST MEDICAL HISTORY:  1. Longstanding hypertension.  2. Nonobstructive coronary artery disease.      a.     Cardiac catheterization in July 2002 by Dr. Lewayne Bunting       revealing a separate ostia for the LAD and circumflex. The LAD       proximal has a 30%, and the left circumflex had diffuse 30 to 40%       stenosis after the take-off of a very large first OM.  The right       coronary artery was a dominant with no flow-limiting disease.  3. Diabetes, not well controlled.  4. Dyslipidemia.  5. Chronic back problems.  6. Obesity.   PAST SURGICAL HISTORY:  None.   SOCIAL HISTORY:  He lives in [place] with his wife.  He is disabled  secondary to back injury.  He does not smoke, does not drink alcohol and  no illicit drug use.   FAMILY HISTORY:  His mother is in her 58s with dementia and emphysema.  His father died at age 64 from complications of diabetes.  He has  brothers and sisters without coronary artery disease.   CURRENT MEDICATIONS AT HOME:  1. Enteric-coated aspirin 325 mg once a day.  2. Lovenox 40 mg daily.  3. Enalapril 20 mg once daily.  4. Metformin 10 mg b.i.d.  5. Zocor 40 mg once daily.  6. Metoprolol 50 mg b.i.d.  7. Nitroglycerin 0.4 p.r.n.  8. Insulin per sliding scale.  9. Maalox p.r.n.  10.Mylanta p.r.n.  11.Recently has been started on a nitroglycerin drip at 10 mg per      hour.   ALLERGIES:  No known drug allergies.   CURRENT LABORATORY DATA:  Hemoglobin 14.3, hematocrit 41.9, white blood  cells 10.5, platelets 253.  Sodium 136, potassium 3.9, chloride 103, CO2  of 37.8,  BUN 15, creatinine 0.7, glucose 334, LFTs within normal   limits, lipase 120.  Point of care markers:  CK 52.9, MB less than 1.0,  troponin less than 0.05.   Chest x-ray revealed cardiomegaly without definitive evidence of  failure.  EKG revels normal sinus rhythm with a ventricular rate of 71 beats per  minute without acute ST-T wave abnormalities.   PHYSICAL EXAMINATION:  VITAL SIGNS:  Blood pressure 197/87, pulse 59,  respirations 20, temperature 97.5, O2 saturation 94% on room air.  Patient weight: 274.3 pounds.  HEENT:  Head is normocephalic, atraumatic.  Eyes:  PERRLA.  Mucous  membranes of mouth pink and moist.  Tongue is midline.  NECK:  Supple, obese.  There are positive bilateral bruits.  No JVD is  seen.  CARDIOVASCULAR:  Regular rate and rhythm with 1/6 systolic murmur  without murmurs, gallops or rubs.  Pulses 2+ and equal bilaterally  radially and dorsalis pedis.  LUNGS:  Clear to auscultation without wheezes, rales or rhonchi.  ABDOMEN:  Obese, nontender with 2+ bowel sounds, no rebound or guarding.  EXTREMITIES:  Without cyanosis, clubbing or edema.  NEURO:  Cranial nerves 2 through 12 grossly intact.   IMPRESSION:  1. Chest pain in a patient with no known obstructive coronary artery      disease per catheterization in 2002 with normal EKG but multiple      cardiovascular risk factors.  2. Uncontrolled diabetes.  3. Uncontrolled hypertension.  4. Situational depression.   PLAN:  1. The patient has been seen and examined by me and by Dr. Arvilla Meres.  [name] chest pain is atypical and also has      typical features, but given the patient's risk factors and response      to nitroglycerin probably      best to proceed with cardiac catheterization on Monday to clearly      define coronary anatomy.  2. Will start proton pump inhibitor and consider treatment for      depression per primary care.  3. Will change Lopressor to Coreg 6.25 mg b.i.d. and monitor blood      pressure response.      Bettey Mare.  Lyman Bishop, NP      Bevelyn Buckles. Bensimhon, MD  Electronically Signed  KML/MEDQ  D:  06/29/2007  T:  06/30/2007  Job:  [id number]   cc:   Gordy Savers, MD

## 2011-01-11 NOTE — Cardiovascular Report (Signed)
NAMEMarland Kitchen  [name], [name] NO.:  [id number]   MEDICAL RECORD NO.:  [id number]          PATIENT TYPE:  INP   LOCATION:  2922                         FACILITY:  [facility]   PHYSICIAN:  Noralyn Pick. Eden Emms, MD, FACCDATE OF BIRTH:  Jul 07, 1951   DATE OF PROCEDURE:  DATE OF DISCHARGE:                            CARDIAC CATHETERIZATION   CORONARY ARTERIOGRAPHY:   INDICATION:  Chest pain.   Left Heart Catheterization:  A 6-French catheter was placed in the  right femoral artery.  The  patient was morbidly obese.  Fluoroscopically, we identified the femoral  head.  His groin stick, however, turned out to be slightly high.  Angiography of the iliac showed this to be just below the inferior  epigastric artery and we decided not to StarClose the patient at the end  of the case.   Left main coronary artery was normal.   Left anterior descending artery was normal.   There was a large branching first diagonal branch which was normal.   Circumflex coronary artery was nondominant and normal.  There was a  large obtuse marginal branch which was normal.   The right coronary artery was dominant.  There was 20 to 30%  multiple  discrete lesions in the proximal and distal vessel.  PDA had 30% mid-  vessel disease.   RV ventriculography:  RV ventriculography was normal.  EF was 60%.  There was no gradient across the aortic valve and no MR.  Aortic  pressure is 130/72, LV pressure is 130/12.   IMPRESSION:  Patient does not have significant coronary artery disease.  He will be watched overnight due to the risks for retroperitoneal  bleeding given a somewhat high stick and the patient's morbid obesity.   As long as his groin looks stable and his hemoglobin and creatinine are  stable, he will be discharged by primary care service in the morning.      Noralyn Pick. Eden Emms, MD, [facility]  Electronically Signed     PCN/MEDQ  D:  07/02/2007  T:  07/02/2007  Job:  [id number]

## 2011-01-11 NOTE — Op Note (Signed)
NAME:  [name], [name] NO.:  [id number]   MEDICAL RECORD NO.:  [id number]          PATIENT TYPE:  AMB   LOCATION:  [facility]                         FACILITY:  [facility]   PHYSICIAN:  Ronald L. Earlene Plater, M.D.  DATE OF BIRTH:  01/26/1951   DATE OF PROCEDURE:  11/23/2007  DATE OF DISCHARGE:                               OPERATIVE REPORT   PREOPERATIVE DIAGNOSES:  1. Phimosis.  2. Balanitis.  3. Diabetes.   POSTOPERATIVE DIAGNOSES:  1. Phimosis.  2. Balanitis.  3. Diabetes.   OPERATIVE PROCEDURE:  Circumcision.   SURGEON:  Dr. Gaynelle Arabian.   ANESTHESIA:  LMA.   BLOOD LOSS:  15 mL.   TUBES:  None.   COMPLICATIONS:  None.   INDICATIONS FOR PROCEDURE:  [name] is a very nice 60 year old white male  who presents with significant penile problems. He notes he has not been  able to retract his foreskin for some time and when he does try to  retract it, he has significant cracking and bleeding.  He is diabetic  and has some difficulty controlling his sugars. On examination, he had a  very tight phimosis, chronic inflammation although it was not acute and  we were unable to retract the foreskin. He was also having some  hesitancy of urination. After understanding the risks, benefits and  alternatives, he elected to undergo circumcision.   DESCRIPTION OF PROCEDURE:  The patient was placed in the supine position  and after proper LMA anesthesia was placed in the supine position and  prepped and draped with Betadine in a sterile fashion.  A dorsal slit  was performed and the glans was retracted and again prepped with  Betadine. A circumferential incision was made in the shaft skin at the  appropriate level and extended down to Buck's fascia and the corpus  spongiosum. A circumferential incision was made approximately 2 mm  proximal to the corona aerota and extended to the same tissue planes.  The foreskin was then carefully resected utilizing both sharp dissection  and blunt  dissection and Bovie coagulation cautery.  Good hemostasis  noted to be present and the foreskin was submitted for identification  only. A thorough irrigation was performed with normal saline.  The shaft  skin was then approximated to the mucosa, a dorsal stitch was placed  with 4-0 chromic as was a ventral U type stitch and each was run to the  other. A frenuloplasty was then  performed with a running of 4-0 chromic catgut.  Good hemostasis was  present.  The wound was dressed with Vaseline gauze, a 4x4 sponge and  Coban.  A dorsal penile block was then performed with 10 mL of 0.25%  Marcaine and the patient was taken to the recovery room stable.      Ronald L. Earlene Plater, M.D.  Electronically Signed     RLD/MEDQ  D:  11/23/2007  T:  11/24/2007  Job:  [id number]

## 2011-01-14 NOTE — Discharge Summary (Signed)
[place]  Patient:    [name], [name]                        MRN: [id number] Adm. Date:  [id number] Disc. Date: [id number] Attending:  Learta Codding Dictator:   Annett Fabian, P.A. CC:         Lewayne Bunting, M.D.   Discharge Summary  DISCHARGE DIAGNOSES: 1. Chest pain, etiology unknown, negative cardiac catheterization. 2. Hypertension. 3. Non-insulin-dependent diabetes mellitus. 4. Hyperlipidemia.  HOSPITAL COURSE:  The patient presented to the ER, complaining of chest pain with radiation to the left arm along with nausea, diaphoresis, and shortness of breath.  He was seen and admitted by Lewayne Bunting, M.D., with an admitting diagnosis of unstable angina.  The patient was placed on IV nitroglycerin, heparin, and oral beta blocker.  He was noted placed on a IIb-IIIa agent secondary to heme-positive stools.  The patient was taken to the cardiac catheterization laboratory the following day by Lewayne Bunting, M.D.  Catheterization Results:  1. LAD:  Diffuse 30% proximally.  2. Left coronary artery:  Focal mid region 30-40%. 3. Ejection fraction 50-65%.  The following day, the patient was again see by Lewayne Bunting, M.D.  The patient was stable, pain-free, and felt to be stable for discharge.  DISCHARGE MEDICATIONS: 1. Enteric-coated aspirin 325 mg q.d. 2. Altace 2.5 mg q.d. 3. Zocor 20 mg q.d. at night. 4. Glucophage XR 500 mg q.d. at night.  Start on Saturday, October 07, 2000.  ACTIVITY:  The patient is to avoid driving, sexual activity, and tube baths until October 07, 2000.  He was to return to light duty at work until October 13, 2000.  DIET:  He is to follow a low-fat, low-cholesterol diet.  WOUND CARE:  He is to watch the catheterization site for pain, bleeding, and swelling.  He is to call the [place] office with any of these problems.  FOLLOW-UP:  He is to contact a primary care physician and establish a relationship with a follow-up  appointment within two to three weeks.  He is to obtain a BMET test within one week.  LABORATORY TESTS:  White count 5.1, hemoglobin 13.3, hematocrit 37.9, platelets 182.  Sodium 135, potassium 4.1, chloride 101, CO2 27, BUN 10, creatinine 0.9, glucose 237.  D-dimer 0.28.  Hemoglobin A1C 8.5.  Cardiac enzymes negative for MI serially.  Total cholesterol 210, triglycerides 144, HDL 78, total cholesterol/HDL 5.5, LDL 143.  The electrocardiogram revealed a normal sinus rhythm at 62, PR 0.194, and axis 24. DD:  10/06/00 TD:  10/07/00 Job: [id number]

## 2011-01-14 NOTE — Discharge Summary (Signed)
NAME:  [name], [name] NO.:  [id number]   MEDICAL RECORD NO.:  [id number]          PATIENT TYPE:  OBV   LOCATION:  2013                         FACILITY:  [facility]   PHYSICIAN:  Raenette Rover. Felicity Coyer, MDDATE OF BIRTH:  Mar 15, 1951   DATE OF ADMISSION:  06/29/2007  DATE OF DISCHARGE:  07/04/2007                               DISCHARGE SUMMARY   DISCHARGE DIAGNOSES:  1. Chest pain, noncardiac in origin, in the setting of low-grade      temperature and mild hypoxia.  2. Diabetes, type 2, uncontrolled.  3. Depression.   HISTORY OF PRESENT ILLNESS:  [name] is a 60 year old male who was  admitted on June 29, 2007, with chief complaint of substernal chest  pain.  He has multiple known cardiac risk factors.  He was stable until  about 8:00 p.m. on the evening prior to this admission, at which time he  developed lower midchest pain which he described as a severe ache.  This  was associated with nausea and diaphoresis which persisted for several  hours.  He came to the emergency room where he was given nitroglycerin  which alleviated his pain.  He was admitted for further evaluation and  treatment.   PAST MEDICAL HISTORY:  1. Hypertension.  2. Diabetes, type 2.  3. Dyslipidemia.   COURSE OF HOSPITALIZATION:  1. Chest pain.  The patient was admitted and underwent serial cardiac      enzymes, and cardiology consult was requested.  The patient      underwent a cardiac catheterization which was performed on July 02, 2007.  [name] cardiac catheterization was negative for      significant coronary artery disease.  The patient also underwent a      chest x-ray which showed cardiomegaly and findings compatible with      bronchitic changes versus interstitial edema.  2. Diabetes, type 2, uncontrolled.  The patient was started on insulin      during this admission, Humulin 70/30 15 units subcu twice daily      with plans for outpatient titration.   MEDICATIONS AT DISCHARGE:  1. Aspirin 325 mg p.o. daily.  2. Zocor 40 mg p.o. daily.  3. Glipizide 10 mg p.o. daily.  4. Enalapril 20 mg p.o. daily.  5. Centrum 1 tab p.o. daily.  6. Coreg 6.25 mg p.o. b.i.d.  7. Humulin 70/30 15 units subcu injection b.i.d.  8. Insulin test strips.  9. Fluoxetine 10 mg p.o. daily for depression.  10.Insulin syringes.  11.Prilosec OTC 20 mg p.o. daily.   DISPOSITION:  The patient is discharged to home.  He was recommended to  keep a list of his sugars and bring this record to his appointment with  Dr. Ermalene Searing.  He is scheduled to see Dr. Ermalene Searing on July 13, 2007.      Sandford Craze, NP      Raenette Rover. Felicity Coyer, MD  Electronically Signed    MO/MEDQ  D:  09/17/2007  T:  09/17/2007  Job:  [id number]
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
## 2011-01-17 ENCOUNTER — Ambulatory Visit: Payer: Self-pay | Admitting: Family Medicine

## 2011-02-07 ENCOUNTER — Ambulatory Visit (INDEPENDENT_AMBULATORY_CARE_PROVIDER_SITE_OTHER): Payer: Medicare Other | Admitting: Family Medicine

## 2011-02-07 ENCOUNTER — Encounter: Payer: Self-pay | Admitting: Family Medicine

## 2011-02-07 DIAGNOSIS — E785 Hyperlipidemia, unspecified: Secondary | ICD-10-CM

## 2011-02-07 DIAGNOSIS — I2510 Atherosclerotic heart disease of native coronary artery without angina pectoris: Secondary | ICD-10-CM

## 2011-02-07 DIAGNOSIS — E119 Type 2 diabetes mellitus without complications: Secondary | ICD-10-CM

## 2011-02-07 DIAGNOSIS — E559 Vitamin D deficiency, unspecified: Secondary | ICD-10-CM

## 2011-02-07 DIAGNOSIS — I10 Essential (primary) hypertension: Secondary | ICD-10-CM

## 2011-02-07 DIAGNOSIS — E291 Testicular hypofunction: Secondary | ICD-10-CM

## 2011-02-07 MED ORDER — LIRAGLUTIDE 18 MG/3ML ~~LOC~~ SOLN: SUBCUTANEOUS | Status: DC

## 2011-02-07 NOTE — Assessment & Plan Note (Signed)
Recommend testosterone supplementation as this is likely key cause of fatigue.

## 2011-02-07 NOTE — Assessment & Plan Note (Signed)
Due for re-eval. 

## 2011-02-07 NOTE — Progress Notes (Signed)
  Subjective:    Patient ID: [name], male    DOB: 15-Feb-1951, 60 y.o.   MRN: [id number]  HPI  Diabetes: Poor control.. 70/30 73 Units BID, occ taking 65 or 70 Units at night if running low Using medications without difficulties:No Hypoglycemic episodes: 5 episodes -- down to 60-70 at night Hyperglycemic episodes: 220 after pie the night before Feet problems:None Blood Sugars averaging:120-180, checking 2-3 times a day. Eye exam within last year: No  Hypertension: Well controlled on lisinopril   Using medication without problems or lightheadedness:  Chest pain with exertion: None Edema:None Short of breath:mild.. Mainly fatigue Average home MVH:QION controlled Other issues:  Elevated Cholesterol: almost at goal LDL <70 Using medications without problems: No Muscle aches: None Other complaints:None  Hypogonadism: Never went to urologist, wife was sick, had to cancel.  Continue severe fatigue, lack on motivation. Denies depression/anxiety.  PMH and SH reviewed.   Vital signs, Meds and allergies reviewed.  ROS: See HPI.  Otherwise nontributory.   GEN: nad, alert and oriented, morbidly obese HEENT: mucous membranes moist NECK: supple w/o LA CV: rrr.  no murmur PULM: ctab, no inc wob ABD: soft, +bs EXT: no edema SKIN: no acute rash  Diabetic foot exam: Normal inspection No skin breakdown No calluses  Normal DP pulses Normal sensation to light tough and monofilament Nails normal    Fatigue continues.Marland Kitchen He felt that vit D helped, vit B12 helped minimally. Finishe vit D several months ago.   Review of Systems     Objective:   Physical Exam        Assessment & Plan:

## 2011-02-07 NOTE — Assessment & Plan Note (Signed)
Poor control. Disucssed correct time to treat low, not to adjust insulin at home on own. Go back to 70/30 70 Units daily. Start victoza .Marland Kitchen Samples and instruction given in detail.

## 2011-02-07 NOTE — Patient Instructions (Addendum)
We will call you with vit D results.. If low we will call in prescription. If normal start taking vit D3 about 1200 mg daily Continue 70/30 at 70 Units BID.  Do not decrease insulin or treat with OJ or sugar if running 80 or above.  You need to get used to normal blood sugar numbers. Start victoza, call if too expensive. Keep appt for urologist to look into getting on testosterone replacement.  Try to get back on track with diet and exercise.

## 2011-02-07 NOTE — Assessment & Plan Note (Signed)
Almost at goal <70 on current meds. Encouraged exercise, weight loss, healthy eating habits.

## 2011-02-08 ENCOUNTER — Telehealth: Payer: Self-pay | Admitting: Family Medicine

## 2011-02-08 LAB — VITAMIN D 25 HYDROXY (VIT D DEFICIENCY, FRACTURES): Vit D, 25-Hydroxy: 29 ng/mL — ABNORMAL LOW (ref 30–89)

## 2011-02-08 MED ORDER — VITAMIN D (ERGOCALCIFEROL) 1.25 MG (50000 UNIT) PO CAPS: 50000.0000 [IU] | ORAL_CAPSULE | ORAL | Status: DC

## 2011-02-08 NOTE — Telephone Encounter (Signed)
Vit D is low again. We will repeat prescription dose x 12 weeks, then pt needs to take OTC ca and Vit D 600/400 BID.

## 2011-02-09 NOTE — Telephone Encounter (Signed)
Patient advised.

## 2011-03-10 ENCOUNTER — Other Ambulatory Visit: Payer: Self-pay | Admitting: Family Medicine

## 2011-03-17 ENCOUNTER — Encounter: Payer: Self-pay | Admitting: Family Medicine

## 2011-04-28 ENCOUNTER — Telehealth: Payer: Self-pay | Admitting: Family Medicine

## 2011-04-28 NOTE — Telephone Encounter (Signed)
Called by Dr. Zella Ball retinal specialist. 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 Dr. Zella Ball recommends tighter control of BP (pt stated today occ numbers around 160 systolic).. Past numbers in our office have been 122/70, 130/70. He has upcomig OV in 04/2011.. We will re-eval BP at that time. May need medication adjustment.

## 2011-05-06 ENCOUNTER — Other Ambulatory Visit: Payer: Self-pay | Admitting: Family Medicine

## 2011-05-09 ENCOUNTER — Other Ambulatory Visit (INDEPENDENT_AMBULATORY_CARE_PROVIDER_SITE_OTHER): Payer: Medicare Other

## 2011-05-09 DIAGNOSIS — E119 Type 2 diabetes mellitus without complications: Secondary | ICD-10-CM

## 2011-05-09 LAB — HEMOGLOBIN A1C: Hgb A1c MFr Bld: 8.1 % — ABNORMAL HIGH (ref 4.6–6.5)

## 2011-05-12 ENCOUNTER — Ambulatory Visit (INDEPENDENT_AMBULATORY_CARE_PROVIDER_SITE_OTHER): Payer: Medicare Other | Admitting: Family Medicine

## 2011-05-12 ENCOUNTER — Encounter: Payer: Self-pay | Admitting: Family Medicine

## 2011-05-12 VITALS — BP 140/72 | HR 84 | Temp 98.0°F | Ht 67.0 in | Wt 304.8 lb

## 2011-05-12 DIAGNOSIS — E785 Hyperlipidemia, unspecified: Secondary | ICD-10-CM

## 2011-05-12 DIAGNOSIS — Z23 Encounter for immunization: Secondary | ICD-10-CM

## 2011-05-12 DIAGNOSIS — E119 Type 2 diabetes mellitus without complications: Secondary | ICD-10-CM

## 2011-05-12 DIAGNOSIS — I10 Essential (primary) hypertension: Secondary | ICD-10-CM

## 2011-05-12 MED ORDER — LOSARTAN POTASSIUM 25 MG PO TABS: 25.0000 mg | ORAL_TABLET | Freq: Every day | ORAL | Status: DC

## 2011-05-12 NOTE — Patient Instructions (Signed)
Add losartan to other medicines.  Follow BP at home  After you get monitor fixed... Goal <130/80. Call if it is running above this. Stay on same dose of victoza and insulin.  Wathc diet and restart exercsie

## 2011-05-12 NOTE — Assessment & Plan Note (Signed)
Re-eval next OV. 

## 2011-05-12 NOTE — Progress Notes (Signed)
  Subjective:    Patient ID: [name], male    DOB: 12-01-1950, 60 y.o.   MRN: [id number]  HPI  Diabetes: Poor control.. 70/30 65 Units BID (saw blood sugars coming down, started victoza last OV, has been on 1.8 for a while now. Using medications without difficulties:No  Hypoglycemic episodes: Does still have slight los to 80s at night, eats small amount of cracker, peanut butter. Hyperglycemic episodes: 253    Feet problems:None  Blood Sugars averaging: 109-126, checking 2 times a day.  Eye exam within last year: Yes No weight loss.    Hypertension: Recent moderate on lisinopril, above goal today. At eye Md office recent visit was high. Given eye issues we need very close BP control. Using medication without problems or lightheadedness:  Chest pain with exertion: None  Edema:None  Short of breath:mild.. Mainly fatigue  Average home BPs: He feels home meter not working well. Other issues:   Elevated Cholesterol: almost at goal LDL <70 at last check  On fenofibrate Using medications without problems: No  Muscle aches: None  Other complaints:Due for re-eval next check.     Review of Systems  Constitutional: Positive for fatigue. Negative for fever.  HENT: Negative for ear pain.   Eyes: Negative for pain.  Respiratory: Positive for shortness of breath. Negative for wheezing.   Cardiovascular: Negative for chest pain, palpitations and leg swelling.  Gastrointestinal: Negative for abdominal distention.       Objective:   Physical Exam  Constitutional: Vital signs are normal. He appears well-developed and well-nourished.  HENT:  Head: Normocephalic.  Right Ear: Hearing normal.  Left Ear: Hearing normal.  Nose: Nose normal.  Mouth/Throat: Oropharynx is clear and moist and mucous membranes are normal.  Neck: Trachea normal. Carotid bruit is not present. No mass and no thyromegaly present.  Cardiovascular: Normal rate, regular rhythm and normal pulses.  Exam reveals no  gallop, no distant heart sounds and no friction rub.   No murmur heard.      No peripheral edema  Pulmonary/Chest: Effort normal and breath sounds normal. No respiratory distress.  Skin: Skin is warm, dry and intact. No rash noted.  Psychiatric: He has a normal mood and affect. His speech is normal and behavior is normal. Thought content normal.    Diabetic foot exam: Normal inspection No skin breakdown No calluses  Normal DP pulses Normal sensation to light touch and monofilament Nails normal       Assessment & Plan:

## 2011-05-12 NOTE — Assessment & Plan Note (Signed)
Inadequate control, add losartan for closer control given CAD and eye issues.

## 2011-05-12 NOTE — Assessment & Plan Note (Signed)
Improved control on victoza. Continue work. He has self lower dose of insulin to 65 Units BID, but we will let this stand for now. Work on diet and exercise improvement. Follow up in 3 months.

## 2011-05-23 LAB — URINALYSIS, ROUTINE W REFLEX MICROSCOPIC
Bilirubin Urine: NEGATIVE
Glucose, UA: 500 — AB
Hgb urine dipstick: NEGATIVE
Ketones, ur: NEGATIVE
Nitrite: NEGATIVE
Protein, ur: NEGATIVE
Specific Gravity, Urine: 1.012
Urobilinogen, UA: 1
pH: 7

## 2011-05-23 LAB — CBC
HCT: 37.3 — ABNORMAL LOW
Hemoglobin: 13.1
MCHC: 35.1
MCV: 84.6
Platelets: 262
RBC: 4.41
RDW: 13.6
WBC: 6

## 2011-05-23 LAB — PROTIME-INR
INR: 0.9
Prothrombin Time: 12.7

## 2011-05-23 LAB — COMPREHENSIVE METABOLIC PANEL
ALT: 27
AST: 16
Albumin: 3.7
Alkaline Phosphatase: 79
BUN: 7
CO2: 31
Calcium: 9.4
Chloride: 103
Creatinine, Ser: 0.79
GFR calc Af Amer: >60
GFR calc non Af Amer: >60
Glucose, Bld: 179 — ABNORMAL HIGH
Potassium: 5.1
Sodium: 139
Total Bilirubin: 0.6
Total Protein: 6.5

## 2011-05-23 LAB — URINE MICROSCOPIC-ADD ON

## 2011-05-23 LAB — APTT: aPTT: 30

## 2011-05-27 LAB — CBC
HCT: 41.2
Hemoglobin: 14
MCHC: 34.1
MCV: 87.8
Platelets: 205
RBC: 4.69
RDW: 14.2
WBC: 10.4

## 2011-05-27 LAB — COMPREHENSIVE METABOLIC PANEL
ALT: 15
AST: 19
Albumin: 3.8
Alkaline Phosphatase: 92
BUN: 13
CO2: 27
Calcium: 9.3
Chloride: 102
Creatinine, Ser: 0.87
GFR calc Af Amer: >60
GFR calc non Af Amer: >60
Glucose, Bld: 388 — ABNORMAL HIGH
Potassium: 3.9
Sodium: 136
Total Bilirubin: 0.9
Total Protein: 6.8

## 2011-05-27 LAB — DIFFERENTIAL
Basophils Absolute: 0
Basophils Relative: 0
Eosinophils Absolute: 0
Eosinophils Relative: 0
Lymphocytes Relative: 10 — ABNORMAL LOW
Lymphs Abs: 1
Monocytes Absolute: 0.3
Monocytes Relative: 3
Neutro Abs: 9 — ABNORMAL HIGH
Neutrophils Relative %: 87 — ABNORMAL HIGH

## 2011-05-27 LAB — POCT CARDIAC MARKERS
CKMB, poc: 1.5
CKMB, poc: <1 — ABNORMAL LOW
Myoglobin, poc: 32.2
Myoglobin, poc: 42.5
Operator id: 277751
Operator id: 277751
Troponin i, poc: <0.05
Troponin i, poc: <0.05

## 2011-05-27 LAB — LIPASE, BLOOD: Lipase: 20

## 2011-05-27 LAB — KETONES, QUALITATIVE: Acetone, Bld: NEGATIVE

## 2011-06-07 LAB — CK TOTAL AND CKMB (NOT AT ARMC)
CK, MB: 1.6
Relative Index: INVALID
Total CK: 48

## 2011-06-07 LAB — LIPID PANEL
Cholesterol: 151
HDL: 27 — ABNORMAL LOW
LDL Cholesterol: 76
Total CHOL/HDL Ratio: 5.6
Triglycerides: 238 — ABNORMAL HIGH
VLDL: 48 — ABNORMAL HIGH

## 2011-06-07 LAB — BASIC METABOLIC PANEL
BUN: 11
BUN: 13
BUN: 14
CO2: 27
CO2: 28
CO2: 29
Calcium: 8.6
Calcium: 8.6
Calcium: 8.7
Chloride: 101
Chloride: 102
Chloride: 104
Creatinine, Ser: 0.77
Creatinine, Ser: 0.81
Creatinine, Ser: 0.99
GFR calc Af Amer: >60
GFR calc Af Amer: >60
GFR calc Af Amer: >60
GFR calc non Af Amer: >60
GFR calc non Af Amer: >60
GFR calc non Af Amer: >60
Glucose, Bld: 142 — ABNORMAL HIGH
Glucose, Bld: 187 — ABNORMAL HIGH
Glucose, Bld: 209 — ABNORMAL HIGH
Potassium: 3.5
Potassium: 3.6
Potassium: 3.7
Sodium: 137
Sodium: 139
Sodium: 140

## 2011-06-07 LAB — CBC
HCT: 35.1 — ABNORMAL LOW
HCT: 36.7 — ABNORMAL LOW
HCT: 36.8 — ABNORMAL LOW
HCT: 37.8 — ABNORMAL LOW
Hemoglobin: 12.1 — ABNORMAL LOW
Hemoglobin: 12.4 — ABNORMAL LOW
Hemoglobin: 12.4 — ABNORMAL LOW
Hemoglobin: 12.8 — ABNORMAL LOW
MCHC: 33.7
MCHC: 33.9
MCHC: 34
MCHC: 34.4
MCV: 87
MCV: 87
MCV: 87.7
MCV: 87.9
Platelets: 172
Platelets: 189
Platelets: 191
Platelets: 205
RBC: 4.04 — ABNORMAL LOW
RBC: 4.19 — ABNORMAL LOW
RBC: 4.22
RBC: 4.31
RDW: 13.4
RDW: 13.6
RDW: 13.6
RDW: 13.8
WBC: 5.9
WBC: 7.4
WBC: 7.9
WBC: 8.2

## 2011-06-07 LAB — HEPATIC FUNCTION PANEL
ALT: 18
AST: 11
Albumin: 3.4 — ABNORMAL LOW
Alkaline Phosphatase: 70
Bilirubin, Direct: 0.1
Indirect Bilirubin: 0.6
Total Bilirubin: 0.7
Total Protein: 6.2

## 2011-06-07 LAB — CARDIAC PANEL(CRET KIN+CKTOT+MB+TROPI)
CK, MB: 0.9
CK, MB: 1.1
CK, MB: 1.1
Relative Index: INVALID
Relative Index: INVALID
Relative Index: INVALID
Total CK: 39
Total CK: 42
Total CK: 48
Troponin I: 0.01
Troponin I: 0.02
Troponin I: 0.02

## 2011-06-07 LAB — HEPARIN LEVEL (UNFRACTIONATED)
Heparin Unfractionated: 0.22 — ABNORMAL LOW
Heparin Unfractionated: 0.24 — ABNORMAL LOW
Heparin Unfractionated: 0.36
Heparin Unfractionated: 0.38
Heparin Unfractionated: 0.4

## 2011-06-07 LAB — PROTIME-INR
INR: 1
INR: 1
Prothrombin Time: 13.3
Prothrombin Time: 13.4

## 2011-06-07 LAB — D-DIMER, QUANTITATIVE: D-Dimer, Quant: 0.77 — ABNORMAL HIGH

## 2011-06-08 LAB — POCT I-STAT CREATININE
Creatinine, Ser: 0.7
Operator id: 270651

## 2011-06-08 LAB — I-STAT 8, (EC8 V) (CONVERTED LAB)
BUN: 15
Bicarbonate: 23.9
Chloride: 103
Glucose, Bld: 334 — ABNORMAL HIGH
HCT: 44
Hemoglobin: 15
Operator id: 270651
Potassium: 3.9
Sodium: 136
TCO2: 25
pCO2, Ven: 37.8 — ABNORMAL LOW
pH, Ven: 7.409 — ABNORMAL HIGH

## 2011-06-08 LAB — POCT CARDIAC MARKERS
CKMB, poc: 1.3
CKMB, poc: <1 — ABNORMAL LOW
Myoglobin, poc: 52.9
Myoglobin, poc: 54.8
Operator id: 270651
Operator id: 288831
Troponin i, poc: <0.05
Troponin i, poc: <0.05

## 2011-06-08 LAB — HEMOGLOBIN A1C
Hgb A1c MFr Bld: 9.3 — ABNORMAL HIGH
Mean Plasma Glucose: 254

## 2011-06-08 LAB — DIFFERENTIAL
Basophils Absolute: 0
Basophils Relative: 0
Eosinophils Absolute: 0
Eosinophils Relative: 0
Lymphocytes Relative: 15
Lymphs Abs: 1.6
Monocytes Absolute: 0.4
Monocytes Relative: 4
Neutro Abs: 8.5 — ABNORMAL HIGH
Neutrophils Relative %: 81 — ABNORMAL HIGH

## 2011-06-08 LAB — CBC
HCT: 41.9
Hemoglobin: 14.3
MCHC: 34.1
MCV: 87.4
Platelets: 253
RBC: 4.8
RDW: 13.6
WBC: 10.5

## 2011-06-08 LAB — HEPATIC FUNCTION PANEL
ALT: 21
AST: 17
Albumin: 4
Alkaline Phosphatase: 79
Bilirubin, Direct: 0.2
Indirect Bilirubin: 0.6
Total Bilirubin: 0.8
Total Protein: 7

## 2011-06-08 LAB — TSH: TSH: 1.41

## 2011-06-08 LAB — CK TOTAL AND CKMB (NOT AT ARMC)
CK, MB: 1.6
Relative Index: INVALID
Total CK: 66

## 2011-06-08 LAB — LIPASE, BLOOD: Lipase: 20

## 2011-06-08 LAB — TROPONIN I: Troponin I: 0.02

## 2011-08-04 ENCOUNTER — Other Ambulatory Visit: Payer: Medicare Other

## 2011-08-11 ENCOUNTER — Ambulatory Visit: Payer: Medicare Other | Admitting: Family Medicine

## 2011-09-29 ENCOUNTER — Other Ambulatory Visit: Payer: Self-pay | Admitting: Family Medicine

## 2011-12-17 LAB — HM DIABETES EYE EXAM

## 2012-01-12 ENCOUNTER — Other Ambulatory Visit: Payer: Medicare Other

## 2012-01-13 ENCOUNTER — Other Ambulatory Visit (INDEPENDENT_AMBULATORY_CARE_PROVIDER_SITE_OTHER): Payer: Medicare Other

## 2012-01-13 DIAGNOSIS — E785 Hyperlipidemia, unspecified: Secondary | ICD-10-CM

## 2012-01-13 DIAGNOSIS — I10 Essential (primary) hypertension: Secondary | ICD-10-CM

## 2012-01-13 DIAGNOSIS — E119 Type 2 diabetes mellitus without complications: Secondary | ICD-10-CM

## 2012-01-13 DIAGNOSIS — IMO0001 Reserved for inherently not codable concepts without codable children: Secondary | ICD-10-CM

## 2012-01-13 LAB — COMPREHENSIVE METABOLIC PANEL
ALT: 28 U/L (ref 0–53)
AST: 21 U/L (ref 0–37)
Albumin: 3.7 g/dL (ref 3.5–5.2)
Alkaline Phosphatase: 75 U/L (ref 39–117)
BUN: 19 mg/dL (ref 6–23)
CO2: 27 mEq/L (ref 19–32)
Calcium: 9 mg/dL (ref 8.4–10.5)
Chloride: 108 mEq/L (ref 96–112)
Creatinine, Ser: 0.8 mg/dL (ref 0.4–1.5)
GFR: 112.09 mL/min (ref >60.00)
Glucose, Bld: 263 mg/dL — ABNORMAL HIGH (ref 70–99)
Potassium: 4.1 mEq/L (ref 3.5–5.1)
Sodium: 142 mEq/L (ref 135–145)
Total Bilirubin: 0.6 mg/dL (ref 0.3–1.2)
Total Protein: 6.9 g/dL (ref 6.0–8.3)

## 2012-01-13 LAB — LIPID PANEL
Cholesterol: 208 mg/dL — ABNORMAL HIGH (ref 0–200)
HDL: 33.2 mg/dL — ABNORMAL LOW (ref >39.00)
Total CHOL/HDL Ratio: 6
Triglycerides: 240 mg/dL — ABNORMAL HIGH (ref 0.0–149.0)
VLDL: 48 mg/dL — ABNORMAL HIGH (ref 0.0–40.0)

## 2012-01-13 LAB — HEMOGLOBIN A1C: Hgb A1c MFr Bld: 9.2 % — ABNORMAL HIGH (ref 4.6–6.5)

## 2012-01-13 LAB — LDL CHOLESTEROL, DIRECT: Direct LDL: 148.3 mg/dL

## 2012-01-16 ENCOUNTER — Encounter: Payer: Self-pay | Admitting: Family Medicine

## 2012-01-16 ENCOUNTER — Ambulatory Visit (INDEPENDENT_AMBULATORY_CARE_PROVIDER_SITE_OTHER): Payer: Medicare Other | Admitting: Family Medicine

## 2012-01-16 VITALS — BP 150/80 | HR 82 | Temp 98.6°F | Ht 67.0 in | Wt 309.0 lb

## 2012-01-16 DIAGNOSIS — F329 Major depressive disorder, single episode, unspecified: Secondary | ICD-10-CM

## 2012-01-16 DIAGNOSIS — I10 Essential (primary) hypertension: Secondary | ICD-10-CM

## 2012-01-16 DIAGNOSIS — E785 Hyperlipidemia, unspecified: Secondary | ICD-10-CM

## 2012-01-16 DIAGNOSIS — E119 Type 2 diabetes mellitus without complications: Secondary | ICD-10-CM

## 2012-01-16 DIAGNOSIS — E291 Testicular hypofunction: Secondary | ICD-10-CM

## 2012-01-16 LAB — HM DIABETES FOOT EXAM

## 2012-01-16 MED ORDER — PAROXETINE HCL 10 MG PO TABS: 10.0000 mg | ORAL_TABLET | ORAL | Status: DC

## 2012-01-16 MED ORDER — LOSARTAN POTASSIUM 50 MG PO TABS: 50.0000 mg | ORAL_TABLET | Freq: Every day | ORAL | Status: DC

## 2012-01-16 NOTE — Progress Notes (Signed)
  Subjective:    Patient ID: [name], male    DOB: [birth year], 61 y.o.   MRN: [id number]  HPI 61 year old male with DM, high chol and HTN who states he has stopped most his medication  because he does not care any more... He  Reports depressive symptoms.   He reports fatigue, he has lost a lot of his eye sight. He has insomnia.  A lot of stresses. Wife and him arguing a lot. Feels like he wants to cry all the time.  Has history of low testosterone...tried  replacement but didn't help much and he has to help up his wife a lot (she is not supposed to touch women). Also very expensive.   Diabetes: Poor control. Has been gaining weight, eating a lot since on insulin 70/30. Using medications without difficulties:Yes, see above Hypoglycemic episodes:? Hyperglycemic episodes:? Feet problems:None Blood Sugars averaging:not checking regualrly eye exam within last year:yes  Lab Results  Component Value Date   HGBA1C 9.2* 01/13/2012   He is interested in bariatric surgery like banding.  Elevated Cholesterol: Stooped pravastatin due to side effects. Even at low dose. Using medications without problems:None Lab Results  Component Value Date   CHOL 208* 01/13/2012   HDL 33.20* 01/13/2012   LDLCALC 101* 11/16/2010   LDLDIRECT 148.3 01/13/2012   TRIG 240.0* 01/13/2012   CHOLHDL 6 01/13/2012    Muscle aches: Yes Diet compliance:Poor Exercise:None Other complaints:  Hypertension:  Stopped lisinopril (feels like it made him feel ill. , still on amlodipine and losartan  Using medication without problems or lightheadedness: NOne Chest pain with exertion: NONE Edema:occ Short of breath:None Average home BPs:not checking Other issues:  Review of Systems     Objective:   Physical Exam  Constitutional: Vital signs are normal. He appears well-developed and well-nourished.  HENT:  Head: Normocephalic.  Right Ear: Hearing normal.  Left Ear: Hearing normal.  Nose: Nose normal.    Mouth/Throat: Oropharynx is clear and moist and mucous membranes are normal.  Neck: Trachea normal. Carotid bruit is not present. No mass and no thyromegaly present.  Cardiovascular: Normal rate, regular rhythm and normal pulses.  Exam reveals no gallop, no distant heart sounds and no friction rub.   No murmur heard.      No peripheral edema  Pulmonary/Chest: Effort normal and breath sounds normal. No respiratory distress.  Skin: Skin is warm, dry and intact. No rash noted.  Psychiatric: His speech is normal. Thought content normal. He is slowed. Cognition and memory are normal. He expresses impulsivity. He exhibits a depressed mood. He expresses no homicidal and no suicidal ideation. He expresses no suicidal plans and no homicidal plans.   Diabetic foot exam: Abormal inspection No skin breakdown Positive for  calluses  On soles, no ulcers Normal DP pulses Normal sensation to light touch and monofilament Nails thickened        Assessment & Plan:

## 2012-01-16 NOTE — Assessment & Plan Note (Signed)
Refuses testosterone replacement.

## 2012-01-16 NOTE — Assessment & Plan Note (Signed)
Poor control... Causing barrier to his medication compliance. Will treat with Paxil 10 mg, follow up in 1 month.

## 2012-01-16 NOTE — Assessment & Plan Note (Signed)
Poor control of lisinopril.. Given SE.. Will hold and instead increase losartan to 50 mg. May need further increase to 100 mg daily.

## 2012-01-16 NOTE — Assessment & Plan Note (Signed)
Goal <70 given CAD. Poor control off pravastatin.. Will not restart due to SE.  Next OV consider red yeast rice if agreeable.

## 2012-01-16 NOTE — Patient Instructions (Addendum)
Stop at front desk to get phone number for [facility] [place] [facility] for information session on bariatric surgery. Increase losartan to 50 mg daily. Start paroxetine for depression, take at bedtime. Continue insulin at current dose.. Will adjust at next OV

## 2012-01-16 NOTE — Assessment & Plan Note (Signed)
Poor control on 70/30.. Cannot afford a lot of other options and has had SE from several meds. Discussed bariatric surgery.Marland Kitchen He is interested and will contact [facility] for more info.

## 2012-01-20 ENCOUNTER — Other Ambulatory Visit: Payer: Self-pay | Admitting: *Deleted

## 2012-01-20 MED ORDER — CLEVER CHOICE GLUCOSE CONTROL LOW VI LIQD: Status: DC

## 2012-01-20 MED ORDER — DR LANCETS MISC: Status: DC

## 2012-01-20 MED ORDER — CLEVER CHOICE GLUCOSE CONTROL HIGH VI LIQD: Status: DC

## 2012-01-27 ENCOUNTER — Other Ambulatory Visit: Payer: Self-pay | Admitting: *Deleted

## 2012-01-27 MED ORDER — GLUCOSE BLOOD VI STRP: ORAL_STRIP | Status: DC

## 2012-02-06 ENCOUNTER — Other Ambulatory Visit: Payer: Self-pay | Admitting: Family Medicine

## 2012-02-06 ENCOUNTER — Telehealth: Payer: Self-pay | Admitting: *Deleted

## 2012-02-06 NOTE — Telephone Encounter (Signed)
Refill on humulin was sent to pharmacy today but [facility] has faxed a note stating that novolin 70/30 is what they have now that is less expensive.

## 2012-02-07 MED ORDER — INSULIN NPH ISOPHANE & REGULAR (70-30) 100 UNIT/ML ~~LOC~~ SUSP: SUBCUTANEOUS | Status: DC

## 2012-02-07 NOTE — Telephone Encounter (Signed)
Medication updated and sent to pharmacy.

## 2012-02-07 NOTE — Telephone Encounter (Signed)
Okay ot change to novolin 70/30.Marland KitchenMarland Kitchenplease change in med list and refill.

## 2012-02-14 ENCOUNTER — Other Ambulatory Visit: Payer: Self-pay | Admitting: *Deleted

## 2012-02-14 MED ORDER — INSULIN NPH ISOPHANE & REGULAR (70-30) 100 UNIT/ML ~~LOC~~ SUSP: SUBCUTANEOUS | Status: DC

## 2012-02-16 ENCOUNTER — Ambulatory Visit: Payer: Medicare Other | Admitting: Family Medicine

## 2012-02-17 ENCOUNTER — Other Ambulatory Visit: Payer: Self-pay | Admitting: *Deleted

## 2012-02-17 MED ORDER — GLUCOSE BLOOD VI STRP: ORAL_STRIP | Status: AC

## 2012-05-01 ENCOUNTER — Other Ambulatory Visit: Payer: Self-pay | Admitting: *Deleted

## 2012-05-01 NOTE — Telephone Encounter (Signed)
Faxed refill request for humulin 70/30, inject 70 units before breakfast and 70 units before supper.  This isnt on med list, novolin is.  Please advise.  Pharmacy is [facility].

## 2012-05-03 MED ORDER — INSULIN NPH ISOPHANE & REGULAR (70-30) 100 UNIT/ML ~~LOC~~ SUSP: SUBCUTANEOUS | Status: DC

## 2012-05-03 NOTE — Telephone Encounter (Signed)
Okay to refill humulin 70/30 in place of novolin. Please change in med list.

## 2012-05-04 ENCOUNTER — Other Ambulatory Visit: Payer: Self-pay | Admitting: Internal Medicine

## 2012-05-07 ENCOUNTER — Other Ambulatory Visit: Payer: Self-pay | Admitting: Family Medicine

## 2012-05-11 ENCOUNTER — Other Ambulatory Visit: Payer: Self-pay | Admitting: Family Medicine

## 2012-05-21 ENCOUNTER — Other Ambulatory Visit: Payer: Self-pay | Admitting: *Deleted

## 2012-05-24 ENCOUNTER — Other Ambulatory Visit: Payer: Self-pay

## 2012-05-31 ENCOUNTER — Telehealth: Payer: Self-pay | Admitting: Family Medicine

## 2012-05-31 NOTE — Telephone Encounter (Signed)
Refill- humulin 70/30 subQ 10ml vial. Inject 70 units subcutaneously before breakfast and 70 units before supper. Qty 1

## 2012-06-01 MED ORDER — INSULIN NPH ISOPHANE & REGULAR (70-30) 100 UNIT/ML ~~LOC~~ SUSP: SUBCUTANEOUS | Status: DC

## 2012-06-01 NOTE — Telephone Encounter (Signed)
Okay to refill? 

## 2012-07-06 ENCOUNTER — Other Ambulatory Visit: Payer: Self-pay | Admitting: Family Medicine

## 2012-07-09 ENCOUNTER — Ambulatory Visit (INDEPENDENT_AMBULATORY_CARE_PROVIDER_SITE_OTHER): Payer: Medicare Other

## 2012-07-09 DIAGNOSIS — Z23 Encounter for immunization: Secondary | ICD-10-CM

## 2012-08-15 ENCOUNTER — Telehealth: Payer: Self-pay

## 2012-08-15 ENCOUNTER — Ambulatory Visit (INDEPENDENT_AMBULATORY_CARE_PROVIDER_SITE_OTHER): Payer: Medicare Other | Admitting: Family Medicine

## 2012-08-15 ENCOUNTER — Encounter: Payer: Self-pay | Admitting: Family Medicine

## 2012-08-15 VITALS — BP 140/80 | HR 108 | Temp 98.6°F | Ht 67.0 in | Wt 305.1 lb

## 2012-08-15 DIAGNOSIS — R509 Fever, unspecified: Secondary | ICD-10-CM

## 2012-08-15 DIAGNOSIS — R05 Cough: Secondary | ICD-10-CM

## 2012-08-15 DIAGNOSIS — J159 Unspecified bacterial pneumonia: Secondary | ICD-10-CM

## 2012-08-15 DIAGNOSIS — R52 Pain, unspecified: Secondary | ICD-10-CM

## 2012-08-15 DIAGNOSIS — R059 Cough, unspecified: Secondary | ICD-10-CM

## 2012-08-15 LAB — POCT INFLUENZA A/B: Influenza A, POC: NEGATIVE

## 2012-08-15 MED ORDER — DOXYCYCLINE HYCLATE 100 MG PO TABS: 100.0000 mg | ORAL_TABLET | Freq: Two times a day (BID) | ORAL | Status: DC

## 2012-08-15 MED ORDER — HYDROCODONE-HOMATROPINE 5-1.5 MG/5ML PO SYRP
ORAL_SOLUTION | ORAL | Status: DC
Start: 2012-08-15 — End: 2012-12-21

## 2012-08-15 MED ORDER — ALBUTEROL SULFATE HFA 108 (90 BASE) MCG/ACT IN AERS: 2.0000 | INHALATION_SPRAY | Freq: Four times a day (QID) | RESPIRATORY_TRACT | Status: DC | PRN

## 2012-08-15 NOTE — Telephone Encounter (Signed)
pts wife said [facility] has not received rx. Spoke with Meisha at [facility] [address] in process of filling 3 rx. Pt can pick up in one hr. Mrs. Bottger notified while on phone.

## 2012-08-15 NOTE — Progress Notes (Signed)
Nature conservation officer at [facility] [address] [place] [address] Phone: [phone] Fax: [phone]  Date:  08/15/2012   Name:  [name]   DOB:  1951-03-25   MRN:  [id number] Gender: male Age: 61 y.o.  PCP:  Kerby Nora, MD  Evaluating MD: Hannah Beat, MD   Chief Complaint: Cough, Fever and Generalized Body Aches   History of Present Illness:  [name] is a 61 y.o. pleasant patient who presents with 61 the following:  5-6 days of fever, coughing wheezing w/o h/o tobacco abuse or COPD, intermittently productive cough. No GI c/o. 101.6 temperature.   No production of cough, feels like throat is very sore. R ear hurting some. No nausea, vomitting.  No sleep in 3 days.   Patient Active Problem List  Diagnosis  . DIABETES MELLITUS, TYPE II  . HYPERLIPIDEMIA  . OBESITY  . ERECTILE DYSFUNCTION  . DEPRESSIVE DISORDER  . BRANCH RETINAL VEIN OCCLUSION  . HYPERTENSION  . CORONARY ARTERY DISEASE  . GASTRITIS  . CONSTIPATION  . OSTEOARTHRITIS  . LEG PAIN, BILATERAL  . RENAL CALCULUS, HX OF  . TRIGGER FINGER, RIGHT MIDDLE  . OTHER MALAISE AND FATIGUE  . HYPOGONADISM  . VITAMIN B12 DEFICIENCY  . UNSPECIFIED VITAMIN D DEFICIENCY    Past Medical History  Diagnosis Date  . CAD (coronary artery disease)     mild  . Diabetes mellitus type 2, insulin dependent   . Hyperlipemia   . Hypertension   . Osteoarthritis   . Depression   . Kidney stones   . Obesity   . Back injury 02/2002    worker's comp    Past Surgical History  Procedure Date  . Cardiac catheterization 09/2000    diffuse LAD 30% LCA  EF 50-60%  . Cardiac catheterization 06/2007    no significant CAD    History  Substance Use Topics  . Smoking status: Never Smoker   . Smokeless tobacco: Not on file  . Alcohol Use: No    Family History  Problem Relation Age of Onset  . Alzheimer's disease Mother   . Emphysema Mother   . Diabetes Father   . Heart disease Father     MI  .  Cancer Brother     ? Neck cancer    Allergies  Allergen Reactions  . Atorvastatin     REACTION: Body aches    Medication list has been reviewed and updated.  Outpatient Prescriptions Prior to Visit  Medication Sig Dispense Refill  . amLODipine (NORVASC) 10 MG tablet TAKE ONE TABLET BY MOUTH EVERY DAY  30 tablet  3  . Blood Glucose Calibration (CLEVER CHOICE GLUCOSE CONTROL) HIGH LIQD Use as directed.  1 each  3  . Blood Glucose Calibration (CLEVER CHOICE GLUCOSE CONTROL) LOW LIQD Use as directed  1 each  3  . DR Lancets MISC Test blood sugar three times a day.  300 each  6  . glucose blood (CLEVER CHOICE AUTO-CODE TEST) test strip Use as instructed  300 each  6  . insulin NPH-insulin regular (HUMULIN 70/30) (70-30) 100 UNIT/ML injection Inject 70 units before breakfast and 70 units before supper.  40 mL  10  . losartan (COZAAR) 50 MG tablet Take 1 tablet (50 mg total) by mouth daily.  30 tablet  11  . PARoxetine (PAXIL) 10 MG tablet Take 1 tablet (10 mg total) by mouth every morning.  30 tablet  3   Last reviewed on  08/15/2012  9:20 AM by Hannah Beat, MD  Review of Systems:  ROS: GEN: Acute illness details above GI: Tolerating PO intake GU: maintaining adequate hydration and urination Pulm: No SOB Interactive and getting along well at home.  Otherwise, ROS is as per the HPI.   Physical Examination: Filed Vitals:   08/15/12 0841  BP: 140/80  Pulse: 108  Temp: 98.6 F (37 C)  TempSrc: Oral  Height: 5\' 7"  (1.702 m)  Weight: 305 lb 1.9 oz (138.402 kg)  SpO2: 97%    Body mass index is 47.79 kg/(m^2). Ideal Body Weight: Weight in (lb) to have BMI = 25: 159.3    GEN: A and O x 3. WDWN. NAD.    ENT: Nose clear, ext NML.  No LAD.  No JVD.  TM's clear. Oropharynx clear.  PULM: Normal WOB, no distress. Diffuse mostly upper and midportion of lungs with rhonchi and scattered rare wheezes. CV: RRR, no M/G/R, No rubs, No JVD.   EXT: warm and well-perfused, No  c/c/e. PSYCH: Pleasant and conversant.   Assessment and Plan:  1. Pneumonia, bacterial    2. Cough  Influenza A/B  3. Fever  Influenza A/B  4. Body aches  Influenza A/B   Flu neg With pulm exam, most c/w PNA  Doxy, if not improving by Monday, will change to LVQ  Orders Today:  Orders Placed This Encounter  Procedures  . Influenza A/B   Results for orders placed in visit on 08/15/12  POCT INFLUENZA A/B      Component Value Range   Influenza A, POC Negative     Influenza B, POC         Updated Medication List: (Includes new medications, updates to list, dose adjustments) Meds ordered this encounter  Medications  . doxycycline (VIBRA-TABS) 100 MG tablet    Sig: Take 1 tablet (100 mg total) by mouth 2 (two) times daily.    Dispense:  20 tablet    Refill:  0  . HYDROcodone-homatropine (HYCODAN) 5-1.5 MG/5ML syrup    Sig: 1-2 tsp po at night before bed prn cough    Dispense:  240 mL    Refill:  0  . albuterol (PROVENTIL HFA;VENTOLIN HFA) 108 (90 BASE) MCG/ACT inhaler    Sig: Inhale 2 puffs into the lungs every 6 (six) hours as needed for wheezing.    Dispense:  1 Inhaler    Refill:  3    Medications Discontinued: There are no discontinued medications.   Hannah Beat, MD

## 2012-08-17 ENCOUNTER — Telehealth: Payer: Self-pay | Admitting: Family Medicine

## 2012-08-17 MED ORDER — LEVOFLOXACIN 500 MG PO TABS: 500.0000 mg | ORAL_TABLET | Freq: Every day | ORAL | Status: DC

## 2012-08-17 NOTE — Telephone Encounter (Signed)
Pt's wife left v/m stating that pt was seen on 08/15/12 and has been vomiting for the past 2 days.  She wants to know if he needs to have med changed and stated "and we do not have 24 hours to wait".  Please advise.

## 2012-08-17 NOTE — Telephone Encounter (Signed)
Patient checked blood sugar and it was 254. Patient and wife advised as below.

## 2012-08-17 NOTE — Telephone Encounter (Signed)
Per pt feeling some better, blood sugars not being checked. Needs to check blood sugars regularly while sick and vomiting. Continue medication regimen. N/V likely from antibiotics as SE.  If this continues change .Marland KitchenMarland KitchenStop doxy and change to levaquin. If not keeping down fluids or antibiotics in next 8 hours or if shortness of breath increasing... Go to ER.

## 2012-10-11 ENCOUNTER — Encounter: Payer: Medicare Other | Admitting: Family Medicine

## 2012-10-22 ENCOUNTER — Encounter: Payer: Medicare Other | Admitting: Family Medicine

## 2012-11-08 ENCOUNTER — Other Ambulatory Visit: Payer: Self-pay | Admitting: Family Medicine

## 2012-11-30 ENCOUNTER — Telehealth: Payer: Self-pay | Admitting: Family Medicine

## 2012-11-30 DIAGNOSIS — E119 Type 2 diabetes mellitus without complications: Secondary | ICD-10-CM

## 2012-11-30 DIAGNOSIS — E538 Deficiency of other specified B group vitamins: Secondary | ICD-10-CM

## 2012-11-30 DIAGNOSIS — I10 Essential (primary) hypertension: Secondary | ICD-10-CM

## 2012-11-30 DIAGNOSIS — E559 Vitamin D deficiency, unspecified: Secondary | ICD-10-CM

## 2012-11-30 NOTE — Telephone Encounter (Signed)
Message copied by [name] on Fri Nov 30, 2012  2:04 PM ------      Message from: Alvina Chou      Created: Thu Nov 22, 2012  3:46 PM      Regarding: Lab orders for Monday, 4.7.14       Patient is scheduled for CPX labs, please order future labs, Thanks , Terri       ------

## 2012-12-03 ENCOUNTER — Other Ambulatory Visit (INDEPENDENT_AMBULATORY_CARE_PROVIDER_SITE_OTHER): Payer: Medicare HMO

## 2012-12-03 DIAGNOSIS — E559 Vitamin D deficiency, unspecified: Secondary | ICD-10-CM

## 2012-12-03 DIAGNOSIS — E119 Type 2 diabetes mellitus without complications: Secondary | ICD-10-CM

## 2012-12-03 DIAGNOSIS — E538 Deficiency of other specified B group vitamins: Secondary | ICD-10-CM

## 2012-12-03 LAB — COMPREHENSIVE METABOLIC PANEL
ALT: 21 U/L (ref 0–53)
AST: 15 U/L (ref 0–37)
Albumin: 3.8 g/dL (ref 3.5–5.2)
Alkaline Phosphatase: 83 U/L (ref 39–117)
BUN: 13 mg/dL (ref 6–23)
CO2: 30 mEq/L (ref 19–32)
Calcium: 8.9 mg/dL (ref 8.4–10.5)
Chloride: 102 mEq/L (ref 96–112)
Creatinine, Ser: 1 mg/dL (ref 0.4–1.5)
GFR: 82.09 mL/min (ref >60.00)
Glucose, Bld: 293 mg/dL — ABNORMAL HIGH (ref 70–99)
Potassium: 4.9 mEq/L (ref 3.5–5.1)
Sodium: 139 mEq/L (ref 135–145)
Total Bilirubin: 0.4 mg/dL (ref 0.3–1.2)
Total Protein: 7.2 g/dL (ref 6.0–8.3)

## 2012-12-03 LAB — HEMOGLOBIN A1C: Hgb A1c MFr Bld: 9.8 % — ABNORMAL HIGH (ref 4.6–6.5)

## 2012-12-03 LAB — LIPID PANEL
Cholesterol: 231 mg/dL — ABNORMAL HIGH (ref 0–200)
HDL: 29.3 mg/dL — ABNORMAL LOW (ref >39.00)
Total CHOL/HDL Ratio: 8
Triglycerides: 217 mg/dL — ABNORMAL HIGH (ref 0.0–149.0)
VLDL: 43.4 mg/dL — ABNORMAL HIGH (ref 0.0–40.0)

## 2012-12-03 LAB — LDL CHOLESTEROL, DIRECT: Direct LDL: 166 mg/dL

## 2012-12-03 LAB — VITAMIN B12: Vitamin B-12: 1067 pg/mL — ABNORMAL HIGH (ref 211–911)

## 2012-12-03 NOTE — Addendum Note (Signed)
Addended by: Baldomero Lamy on: 12/03/2012 08:49 AM   Modules accepted: Orders

## 2012-12-04 LAB — VITAMIN D 25 HYDROXY (VIT D DEFICIENCY, FRACTURES): Vit D, 25-Hydroxy: 35 ng/mL (ref 30–89)

## 2012-12-06 ENCOUNTER — Encounter: Payer: Medicare Other | Admitting: Family Medicine

## 2012-12-10 ENCOUNTER — Other Ambulatory Visit: Payer: Self-pay | Admitting: Family Medicine

## 2012-12-21 ENCOUNTER — Ambulatory Visit (INDEPENDENT_AMBULATORY_CARE_PROVIDER_SITE_OTHER): Payer: Medicare HMO | Admitting: Family Medicine

## 2012-12-21 ENCOUNTER — Encounter: Payer: Self-pay | Admitting: Family Medicine

## 2012-12-21 ENCOUNTER — Telehealth: Payer: Self-pay

## 2012-12-21 VITALS — BP 140/62 | HR 88 | Temp 98.0°F | Ht 66.25 in | Wt 305.0 lb

## 2012-12-21 DIAGNOSIS — R0989 Other specified symptoms and signs involving the circulatory and respiratory systems: Secondary | ICD-10-CM

## 2012-12-21 DIAGNOSIS — I10 Essential (primary) hypertension: Secondary | ICD-10-CM

## 2012-12-21 DIAGNOSIS — Z125 Encounter for screening for malignant neoplasm of prostate: Secondary | ICD-10-CM

## 2012-12-21 DIAGNOSIS — Z23 Encounter for immunization: Secondary | ICD-10-CM

## 2012-12-21 DIAGNOSIS — R0683 Snoring: Secondary | ICD-10-CM | POA: Insufficient documentation

## 2012-12-21 DIAGNOSIS — F329 Major depressive disorder, single episode, unspecified: Secondary | ICD-10-CM

## 2012-12-21 DIAGNOSIS — E785 Hyperlipidemia, unspecified: Secondary | ICD-10-CM

## 2012-12-21 DIAGNOSIS — E119 Type 2 diabetes mellitus without complications: Secondary | ICD-10-CM

## 2012-12-21 DIAGNOSIS — R0609 Other forms of dyspnea: Secondary | ICD-10-CM

## 2012-12-21 DIAGNOSIS — E291 Testicular hypofunction: Secondary | ICD-10-CM

## 2012-12-21 DIAGNOSIS — R5381 Other malaise: Secondary | ICD-10-CM

## 2012-12-21 LAB — TESTOSTERONE: Testosterone: 272.33 ng/dL — ABNORMAL LOW (ref 350.00–890.00)

## 2012-12-21 MED ORDER — TESTOSTERONE 2.5 MG/24HR TD PT24: 1.0000 | MEDICATED_PATCH | Freq: Every day | TRANSDERMAL | Status: DC

## 2012-12-21 MED ORDER — TRIAMCINOLONE ACETONIDE 0.5 % EX CREA: TOPICAL_CREAM | Freq: Two times a day (BID) | CUTANEOUS | Status: DC

## 2012-12-21 MED ORDER — EXENATIDE 5 MCG/0.02ML ~~LOC~~ SOPN: 5.0000 ug | PEN_INJECTOR | Freq: Two times a day (BID) | SUBCUTANEOUS | Status: DC

## 2012-12-21 NOTE — Patient Instructions (Addendum)
Stop at lab on your way out. Continue insulin at current dose. Add byetta twice daily 5 mcg... Causes weight loss and better blood sugar control. Follow up in 1 month 30 min... Bring in fasting blood sugars. Call [facility] for  Bariatric surgery seminar. Stop at front desk to set up sleep test with pulmonologist.

## 2012-12-21 NOTE — Progress Notes (Signed)
Subjective:    Patient ID: [name], male    DOB: June 06, 1951, 62 y.o.   MRN: [id number]  HPI I have personally reviewed the Medicare Annual Wellness questionnaire and have noted 1. [name] medical and social history 2. Their use of alcohol, tobacco or illicit drugs 3. Their current medications and supplements 4. [name] functional ability including ADL's, fall risks, home safety risks and hearing or visual             impairment. 5. Diet and physical activities 6. Evidence for depression or mood disorders The patients weight, height, BMI and visual acuity have been recorded in the chart I have made referrals, counseling and provided education to the patient based review of the above and I have provided the pt with a written personalized care plan for preventive services.  Depression: Financial issues, stress. Continues to have feeling of frustration and hopelessness that effect his compliance with lifestyle change and at times medications. No SI.  SSRI in past made him feel worse.  Issues with testosterone low... Did not like testosterone supplement .Marland Kitchen Did not help and family could not touch him.   Some sleep apnea.Marland Kitchen Per wife.  Has different insurance than in past.. May cover more, should be better.  Diabetes: Poor worsening  control. On insulin 70/30. 70 units BID Gaining weight Lab Results  Component Value Date   HGBA1C 9.8* 12/03/2012  Using medications without difficulties:Yes, see above  Hypoglycemic episodes:?  Hyperglycemic episodes:?  Feet problems:None  Blood Sugars averaging:not checking regularly. eye exam within last year:yes   Wt Readings from Last 3 Encounters:  12/21/12 305 lb (138.347 kg)  08/15/12 305 lb 1.9 oz (138.402 kg)  01/16/12 309 lb (140.161 kg)      Elevated Cholesterol:  Poor control. Stopped pravastatin due to side effects. Even at low dose.   Lab Results  Component Value Date   CHOL 231* 12/03/2012   HDL 29.30* 12/03/2012   LDLCALC 101* 11/16/2010   LDLDIRECT 166.0 12/03/2012   TRIG 217.0* 12/03/2012   CHOLHDL 8 12/03/2012   Using medications without problems:None Muscle aches: Yes  Diet compliance:Poor ! Exercise:None! Other complaints:  CAD, goal LDL <70.  Hypertension: On amlodipine and losartan. Lisinopril made him feel ill. Using medication without problems or lightheadedness: None  Chest pain with exertion: NONE  Edema:occ  Short of breath:None  Average home BPs:not checking  Other issues:  Rash on arms and scalp for yearls off and on. Lotion makes it worse.   Review of Systems  Constitutional: Positive for fatigue. Negative for fever.  HENT: Negative for ear pain.   Eyes: Negative for pain.  Respiratory: Negative for shortness of breath.   Cardiovascular: Negative for chest pain and palpitations.  Gastrointestinal: Negative for abdominal pain.  Genitourinary: Negative for dysuria.       Objective:   Physical Exam  Constitutional: He is oriented to person, place, and time. Vital signs are normal. He appears well-developed and well-nourished.  Morbidly obese  HENT:  Head: Normocephalic.  Right Ear: Hearing normal.  Left Ear: Hearing normal.  Nose: Nose normal.  Mouth/Throat: Oropharynx is clear and moist and mucous membranes are normal.  Neck: Trachea normal. Carotid bruit is not present. No mass and no thyromegaly present.  Cardiovascular: Normal rate, regular rhythm and normal pulses.  Exam reveals no gallop, no distant heart sounds and no friction rub.   No murmur heard. No peripheral edema  Pulmonary/Chest: Effort normal and breath sounds normal. No respiratory  distress.  Abdominal: Soft. Bowel sounds are normal. There is no tenderness.  Neurological: He is alert and oriented to person, place, and time.  Skin: Skin is warm, dry and intact. No rash noted.  Psychiatric: His speech is normal. Thought content normal. He is slowed. Cognition and memory are normal. He expresses impulsivity.  He exhibits a depressed mood. He expresses no homicidal and no suicidal ideation. He expresses no suicidal plans and no homicidal plans.      Diabetic foot exam: Normal inspection No skin breakdown No calluses  Normal DP pulses Normal sensation to light touch and monofilament Nails normal      Assessment & Plan:  [name] preventative maintenance and recommended screening tests for an annual wellness exam were reviewed in full today. Brought up to date unless services declined.  Counselled on the importance of diet, exercise, and its role in overall health and mortality. [name] FH and SH was reviewed, including their home life, tobacco status, and drug and alcohol status.   Vaccines: Due for PNA vaccine. Colon: Dr. Juanda Chance 08/2009, nml, repeat in 2021. Prostate:  Due for re-eval. Lab Results  Component Value Date   PSA 0.36 01/04/2010   PSA 0.21 10/06/2009   PSA 0.25 07/09/2008  Nonsmoker

## 2012-12-21 NOTE — Assessment & Plan Note (Signed)
Very poorly controlled. Pt concerned about weight gain with insulin. Will add byetta 5 mcg BID if pt can afford and will have him follow up in 1 month.

## 2012-12-21 NOTE — Telephone Encounter (Signed)
Byetta requires prior auth; form on Dr [employer].

## 2012-12-24 ENCOUNTER — Telehealth: Payer: Self-pay | Admitting: *Deleted

## 2012-12-24 DIAGNOSIS — E291 Testicular hypofunction: Secondary | ICD-10-CM

## 2012-12-24 NOTE — Telephone Encounter (Signed)
Androderm 2.5 mg is no longer available. Needs new Rx for 2 mg or 4 mg.   Also requires PA. Form in your IN box for completion.

## 2012-12-24 NOTE — Telephone Encounter (Signed)
Error

## 2012-12-25 MED ORDER — TESTOSTERONE 2 MG/24HR TD PT24: 2.0000 mg | MEDICATED_PATCH | Freq: Every day | TRANSDERMAL | Status: DC

## 2012-12-28 NOTE — Telephone Encounter (Signed)
Approval letter received and on Dr Daphine Deutscher desk for signature and scanning.(faxed to [facility] [address]).

## 2012-12-28 NOTE — Telephone Encounter (Signed)
Ask pt, since adroderm denied.. Does he want to try testim gel again. Family can touch him. Put gel on non exposed area.

## 2012-12-28 NOTE — Telephone Encounter (Signed)
Patient does not want the gel at all

## 2012-12-28 NOTE — Telephone Encounter (Signed)
Denial letter received for androderm patch; on Dr Albertina Senegal desk.

## 2012-12-29 NOTE — Assessment & Plan Note (Signed)
Will re-eval testosterone. Discussed options for testosterone replacement.. Can try patches if covered by insurance.

## 2012-12-29 NOTE — Assessment & Plan Note (Signed)
Well controlled. Continue current medication.  

## 2012-12-29 NOTE — Assessment & Plan Note (Signed)
Sleep apnea may be contributor to daytime fatigue... Send for sleep apnea eval.

## 2012-12-29 NOTE — Assessment & Plan Note (Signed)
LDl almost at goal, trigs high anf HDl low. Encouraged exercise, weight loss, healthy eating habits.

## 2012-12-29 NOTE — Assessment & Plan Note (Signed)
Major contributor to pt non compliance given lack of motivation to care for himself.  Pt not interested in medication to treat at this time

## 2013-01-11 ENCOUNTER — Other Ambulatory Visit: Payer: Self-pay | Admitting: Family Medicine

## 2013-01-22 ENCOUNTER — Ambulatory Visit: Payer: Medicare HMO | Admitting: Family Medicine

## 2013-01-22 DIAGNOSIS — Z0289 Encounter for other administrative examinations: Secondary | ICD-10-CM

## 2013-01-23 ENCOUNTER — Institutional Professional Consult (permissible substitution): Payer: Medicare HMO | Admitting: Pulmonary Disease

## 2013-02-07 ENCOUNTER — Other Ambulatory Visit: Payer: Self-pay | Admitting: Family Medicine

## 2013-02-20 ENCOUNTER — Other Ambulatory Visit: Payer: Medicare HMO

## 2013-02-22 ENCOUNTER — Other Ambulatory Visit (INDEPENDENT_AMBULATORY_CARE_PROVIDER_SITE_OTHER): Payer: Medicare HMO

## 2013-02-22 DIAGNOSIS — E291 Testicular hypofunction: Secondary | ICD-10-CM

## 2013-02-22 DIAGNOSIS — Z125 Encounter for screening for malignant neoplasm of prostate: Secondary | ICD-10-CM

## 2013-02-22 LAB — PSA, MEDICARE: PSA: 0.25 ng/ml (ref 0.10–4.00)

## 2013-02-22 LAB — TESTOSTERONE: Testosterone: 243.81 ng/dL — ABNORMAL LOW (ref 350.00–890.00)

## 2013-02-27 ENCOUNTER — Telehealth: Payer: Self-pay

## 2013-02-27 NOTE — Telephone Encounter (Signed)
Pt can not take Amlodipine and Losartan Potassium any more;meds cause legs to cramp severely. pts wife wants to know if substitute med can be called [facility] [address]. Legs have been bothering him for awhile (not sure how long )the patient stopped both BP meds  For 2 days and leg pain stopped. Pt restarted BP meds 02/26/13 and today legs are hurting severely again. Pt does not have accurate BP cuff; pt also having problems with BS. Pt already has appt scheduled to see Dr Ermalene Searing 02/28/13 at 12 noon. Advised pt if condition changes or worsens to go to UC.

## 2013-02-27 NOTE — Telephone Encounter (Signed)
Agreee with need for appt.

## 2013-02-28 ENCOUNTER — Encounter: Payer: Self-pay | Admitting: Family Medicine

## 2013-02-28 ENCOUNTER — Ambulatory Visit: Payer: Medicare HMO | Admitting: Family Medicine

## 2013-02-28 ENCOUNTER — Ambulatory Visit (INDEPENDENT_AMBULATORY_CARE_PROVIDER_SITE_OTHER): Payer: Medicare HMO | Admitting: Family Medicine

## 2013-02-28 ENCOUNTER — Other Ambulatory Visit: Payer: Self-pay | Admitting: Family Medicine

## 2013-02-28 VITALS — BP 140/70 | HR 83 | Temp 98.1°F | Ht 67.0 in | Wt 308.0 lb

## 2013-02-28 DIAGNOSIS — M79609 Pain in unspecified limb: Secondary | ICD-10-CM

## 2013-02-28 DIAGNOSIS — I10 Essential (primary) hypertension: Secondary | ICD-10-CM

## 2013-02-28 DIAGNOSIS — E119 Type 2 diabetes mellitus without complications: Secondary | ICD-10-CM

## 2013-02-28 LAB — HM DIABETES FOOT EXAM

## 2013-02-28 NOTE — Patient Instructions (Addendum)
Take daily multivitamin and B complex (B12). Start walking 3-5 time a week, 20-30 min at a time. Gradually increase.  Stay off BP meds for now... Follow BP at home.. Call in 2 weeks with meassurement. Loose weight... Goal to start is 30 lbs in next 3 months.

## 2013-02-28 NOTE — Assessment & Plan Note (Signed)
Resolved off BP meds 

## 2013-02-28 NOTE — Progress Notes (Addendum)
  Subjective:    Patient ID: [name], male    DOB: [birth year], 62 y.o.   MRN: [id number]  HPI 62 year olfd male with chronic leg pain presents for follow up.  He had noted association of leg pain with BP medications.  He stopped both BP meds  for 4 days and he felt 100% better. ( No change if he stops one at a time, it is both of the medications)  He started them back on Tuesday and symptoms returned.  He wishes to change these.  No achiness in legs.   Diabetes:  Poor control on 70/30. Using medications without difficulties:never tried byetta. Hypoglycemic episodes:? Hyperglycemic episodes:? Feet problems:None Blood Sugars averaging:not checking lately eye exam within last year:none   Review of Systems  Constitutional: Positive for fatigue.  HENT: Negative for ear discharge.   Eyes: Negative for pain.  Respiratory: Negative for shortness of breath.   Cardiovascular: Negative for chest pain, palpitations and leg swelling.  Gastrointestinal: Negative for abdominal pain.       Objective:   Physical Exam  Constitutional: He is oriented to person, place, and time. Vital signs are normal. He appears well-developed and well-nourished.  Morbidly obese  HENT:  Head: Normocephalic.  Right Ear: Hearing normal.  Left Ear: Hearing normal.  Nose: Nose normal.  Mouth/Throat: Oropharynx is clear and moist and mucous membranes are normal.  Neck: Trachea normal. Carotid bruit is not present. No mass and no thyromegaly present.  Cardiovascular: Normal rate, regular rhythm and normal pulses.  Exam reveals no gallop, no distant heart sounds and no friction rub.   No murmur heard. No peripheral edema  Pulmonary/Chest: Effort normal and breath sounds normal. No respiratory distress.  Abdominal: Soft. Bowel sounds are normal. There is no tenderness.  Neurological: He is alert and oriented to person, place, and time.  Skin: Skin is warm, dry and intact. No rash noted.  Psychiatric: His  speech is normal. Thought content normal. He is slowed. Cognition and memory are normal. He expresses impulsivity. He exhibits a depressed mood. He expresses no homicidal and no suicidal ideation. He expresses no suicidal plans and no homicidal plans.    Diabetic foot exam: Normal inspection No skin breakdown No calluses  Normal DP pulses Normal sensation to light touch and monofilament Nails normal       Assessment & Plan:

## 2013-02-28 NOTE — Assessment & Plan Note (Signed)
Moderate control off BP meds. Goal <130/80.  Follow at home.. Work on weight loss now that able to exercsie, hopefully BP will improve with lifestyle changes. Follow up as scheduled.

## 2013-02-28 NOTE — Assessment & Plan Note (Signed)
Did not try byetta. On insulin. Likely poor control.. /Encouraged exercise, weight loss, healthy eating habits. Follow up 3 months.

## 2013-03-26 ENCOUNTER — Other Ambulatory Visit: Payer: Self-pay | Admitting: Family Medicine

## 2013-05-31 ENCOUNTER — Encounter: Payer: Self-pay | Admitting: Family Medicine

## 2013-05-31 ENCOUNTER — Ambulatory Visit (INDEPENDENT_AMBULATORY_CARE_PROVIDER_SITE_OTHER): Payer: Medicare HMO | Admitting: Family Medicine

## 2013-05-31 VITALS — BP 160/80 | HR 72 | Temp 98.2°F | Ht 67.0 in | Wt 305.2 lb

## 2013-05-31 DIAGNOSIS — I10 Essential (primary) hypertension: Secondary | ICD-10-CM

## 2013-05-31 DIAGNOSIS — E669 Obesity, unspecified: Secondary | ICD-10-CM

## 2013-05-31 DIAGNOSIS — E785 Hyperlipidemia, unspecified: Secondary | ICD-10-CM

## 2013-05-31 DIAGNOSIS — Z23 Encounter for immunization: Secondary | ICD-10-CM

## 2013-05-31 DIAGNOSIS — E119 Type 2 diabetes mellitus without complications: Secondary | ICD-10-CM

## 2013-05-31 LAB — LIPID PANEL
Cholesterol: 242 mg/dL — ABNORMAL HIGH (ref 0–200)
HDL: 38.6 mg/dL — ABNORMAL LOW (ref >39.00)
Total CHOL/HDL Ratio: 6
Triglycerides: 122 mg/dL (ref 0.0–149.0)
VLDL: 24.4 mg/dL (ref 0.0–40.0)

## 2013-05-31 LAB — COMPREHENSIVE METABOLIC PANEL
ALT: 23 U/L (ref 0–53)
AST: 19 U/L (ref 0–37)
Albumin: 4.1 g/dL (ref 3.5–5.2)
Alkaline Phosphatase: 73 U/L (ref 39–117)
BUN: 13 mg/dL (ref 6–23)
CO2: 29 mEq/L (ref 19–32)
Calcium: 9.1 mg/dL (ref 8.4–10.5)
Chloride: 104 mEq/L (ref 96–112)
Creatinine, Ser: 0.9 mg/dL (ref 0.4–1.5)
GFR: 87.06 mL/min (ref >60.00)
Glucose, Bld: 185 mg/dL — ABNORMAL HIGH (ref 70–99)
Potassium: 3.8 mEq/L (ref 3.5–5.1)
Sodium: 139 mEq/L (ref 135–145)
Total Bilirubin: 0.6 mg/dL (ref 0.3–1.2)
Total Protein: 7.4 g/dL (ref 6.0–8.3)

## 2013-05-31 LAB — LDL CHOLESTEROL, DIRECT: Direct LDL: 186.9 mg/dL

## 2013-05-31 LAB — HEMOGLOBIN A1C: Hgb A1c MFr Bld: 9.6 % — ABNORMAL HIGH (ref 4.6–6.5)

## 2013-05-31 MED ORDER — TRIAMTERENE-HCTZ 50-25 MG PO CAPS: 1.0000 | ORAL_CAPSULE | ORAL | Status: DC

## 2013-05-31 NOTE — Assessment & Plan Note (Signed)
Due fo re-eval. Encouraged exercise, weight loss, healthy eating habits.

## 2013-05-31 NOTE — Progress Notes (Signed)
Subjective:    Patient ID: [name], male    DOB: 1951-07-31, 62 y.o.   MRN: [id number]  HPI  62 year old male presetns for 3 month follow up.   At last OV: He had noted association of leg pain with BP medications.  He stopped both BP meds for 4 days and he felt 100% better. ( No change if he stops one at a time, it is both of the medications)  He started them back  and symptoms returned.  He wishes to change these.  No achiness in legs.   Hypertension:  On no medicaiton. Using medication without problems or lightheadedness: None Chest pain with exertion: Edema:None Short of breath:deconditioned. Average home BPs: elevated at [employer] Other issues:    Diabetes: Poor control on 70/30 at last check... Due for re-eval. Using 70 units BID  Lab Results  Component Value Date   HGBA1C 9.8* 12/03/2012  Using medications without difficulties:never tried byetta.  Hypoglycemic episodes:None Hyperglycemic episodes: yes Feet problems:None  Blood Sugars averaging: FBS 250-274, not checking post prandial. eye exam within last year:none    Wt Readings from Last 3 Encounters:  05/31/13 305 lb 4 oz (138.46 kg)  02/28/13 308 lb (139.708 kg)  12/21/12 305 lb (138.347 kg)     Elevated Cholesterol:  Poor control. Due fo re-eval. On no med. Lab Results  Component Value Date   CHOL 231* 12/03/2012   HDL 29.30* 12/03/2012   LDLCALC 101* 11/16/2010   LDLDIRECT 166.0 12/03/2012   TRIG 217.0* 12/03/2012   CHOLHDL 8 12/03/2012  Using medications without problems: on none Muscle aches: None Diet compliance: Exercise: With heat over summer... Not exercsiing but has started now. Other complaints:  Review of Systems  Constitutional: Positive for fatigue.  HENT: Negative for ear discharge.  Eyes: Negative for pain.  Respiratory: Negative for shortness of breath.  Cardiovascular: Negative for chest pain, palpitations and leg swelling.  Gastrointestinal: Negative for abdominal pain.   Has noted  some sharp pain 2 weeks ago in left central finger.. Was swollen.. Almost better now. Objective:   Physical Exam  Constitutional: He is oriented to person, place, and time. Vital signs are normal. He appears well-developed and well-nourished.  Morbidly obese  HENT:  Head: Normocephalic.  Right Ear: Hearing normal.  Left Ear: Hearing normal.  Nose: Nose normal.  Mouth/Throat: Oropharynx is clear and moist and mucous membranes are normal.  Neck: Trachea normal. Carotid bruit is not present. No mass and no thyromegaly present.  Cardiovascular: Normal rate, regular rhythm and normal pulses. Exam reveals no gallop, no distant heart sounds and no friction rub.  No murmur heard. No peripheral edema  Pulmonary/Chest: Effort normal and breath sounds normal. No respiratory distress.  Abdominal: Soft. Bowel sounds are normal. There is no tenderness.  Neurological: He is alert and oriented to person, place, and time.  Skin: Skin is warm, dry and intact. No rash noted.  Psychiatric: His speech is normal. Thought content normal. He is slowed. Cognition and memory are normal. He expresses impulsivity. He exhibits a depressed mood. He expresses no homicidal and no suicidal ideation. He expresses no suicidal plans and no homicidal plans.  Left central finger non tender to palpitation but decrease ROM . No swelling. Diabetic foot exam:  Normal inspection  No skin breakdown  No calluses  Normal DP pulses  Normal sensation to light touch and monofilament  Nails normal       Review of Systems  Objective:   Physical Exam        Assessment & Plan:

## 2013-05-31 NOTE — Patient Instructions (Addendum)
Stop at lab on way out. Work on exercise and weight loss. Increase insulin to 75 Units twice daily. Start new BP medication daily. Can use ibuprofen 800 mg every 8 hours for finger pain as needed for a short time. Follow up appt with labs prior in 3 months. Talk with [facility]... Ask at front for the number.

## 2013-05-31 NOTE — Assessment & Plan Note (Signed)
Poor control. Increase 70/30 to 75 units BID.

## 2013-05-31 NOTE — Assessment & Plan Note (Signed)
He has heard  about bariatric surgery " horror stories" so he is now hesitant. i wencouraged him to attend the seminar to get more info from [facility].

## 2013-05-31 NOTE — Assessment & Plan Note (Signed)
Poor control. Cannot tolerate BBlockers, amlodipine, losartan, aceI. Will try HCTZ/triamtere although this is likely not enough to get to goal < 130/80.

## 2013-06-03 ENCOUNTER — Encounter: Payer: Self-pay | Admitting: *Deleted

## 2013-06-04 ENCOUNTER — Telehealth: Payer: Self-pay | Admitting: Family Medicine

## 2013-06-04 NOTE — Telephone Encounter (Signed)
Red yeast rice 1200 mg twice daily. Make sure he has labs in 3 months scheduled.

## 2013-06-04 NOTE — Telephone Encounter (Signed)
Mrs. Neeson notified to have Mr. Cuff to take Red Yeast Rice 1200 mg two times a day.  Recheck labs in 3 months.

## 2013-06-04 NOTE — Telephone Encounter (Signed)
Message copied by [name] on Tue Jun 04, 2013  9:21 AM ------      Message from: Damita Lack      Created: Mon Jun 03, 2013 11:33 AM       Patient notified as instructed by telephone.  He is willing to try the Red Yeast Rice.  Please advise what dosage you want him to take. Copy of lab results mailed to patient. ------

## 2013-08-27 ENCOUNTER — Telehealth: Payer: Self-pay | Admitting: Family Medicine

## 2013-08-27 ENCOUNTER — Other Ambulatory Visit: Payer: Self-pay | Admitting: Family Medicine

## 2013-08-27 ENCOUNTER — Other Ambulatory Visit: Payer: Medicare HMO

## 2013-08-27 DIAGNOSIS — E1165 Type 2 diabetes mellitus with hyperglycemia: Secondary | ICD-10-CM

## 2013-08-27 DIAGNOSIS — E785 Hyperlipidemia, unspecified: Secondary | ICD-10-CM

## 2013-08-27 DIAGNOSIS — I10 Essential (primary) hypertension: Secondary | ICD-10-CM

## 2013-08-27 DIAGNOSIS — E538 Deficiency of other specified B group vitamins: Secondary | ICD-10-CM

## 2013-08-27 DIAGNOSIS — E559 Vitamin D deficiency, unspecified: Secondary | ICD-10-CM

## 2013-08-27 NOTE — Telephone Encounter (Signed)
Message copied by [name] on Tue Aug 27, 2013  7:44 AM ------      Message from: Alvina Chou      Created: Wed Aug 21, 2013 10:40 AM      Regarding: Lab orders for Tuesday, 12.30.14       Lab orders for a 3 month f/u ------

## 2013-09-03 ENCOUNTER — Ambulatory Visit: Payer: Medicare HMO | Admitting: Family Medicine

## 2013-11-01 ENCOUNTER — Other Ambulatory Visit (INDEPENDENT_AMBULATORY_CARE_PROVIDER_SITE_OTHER): Payer: Medicare HMO

## 2013-11-01 DIAGNOSIS — E1165 Type 2 diabetes mellitus with hyperglycemia: Secondary | ICD-10-CM

## 2013-11-01 DIAGNOSIS — IMO0002 Reserved for concepts with insufficient information to code with codable children: Secondary | ICD-10-CM

## 2013-11-01 DIAGNOSIS — R5383 Other fatigue: Secondary | ICD-10-CM

## 2013-11-01 DIAGNOSIS — R5381 Other malaise: Secondary | ICD-10-CM

## 2013-11-01 DIAGNOSIS — E785 Hyperlipidemia, unspecified: Secondary | ICD-10-CM

## 2013-11-01 DIAGNOSIS — I10 Essential (primary) hypertension: Secondary | ICD-10-CM

## 2013-11-01 DIAGNOSIS — E118 Type 2 diabetes mellitus with unspecified complications: Principal | ICD-10-CM

## 2013-11-01 LAB — COMPREHENSIVE METABOLIC PANEL
ALT: 18 U/L (ref 0–53)
AST: 15 U/L (ref 0–37)
Albumin: 3.8 g/dL (ref 3.5–5.2)
Alkaline Phosphatase: 74 U/L (ref 39–117)
BUN: 13 mg/dL (ref 6–23)
CO2: 29 mEq/L (ref 19–32)
Calcium: 8.9 mg/dL (ref 8.4–10.5)
Chloride: 103 mEq/L (ref 96–112)
Creatinine, Ser: 0.8 mg/dL (ref 0.4–1.5)
GFR: 101.97 mL/min (ref >60.00)
Glucose, Bld: 207 mg/dL — ABNORMAL HIGH (ref 70–99)
Potassium: 3.9 mEq/L (ref 3.5–5.1)
Sodium: 140 mEq/L (ref 135–145)
Total Bilirubin: 0.7 mg/dL (ref 0.3–1.2)
Total Protein: 7.1 g/dL (ref 6.0–8.3)

## 2013-11-01 LAB — MICROALBUMIN / CREATININE URINE RATIO
Creatinine,U: 191.8 mg/dL
Microalb Creat Ratio: 0.5 mg/g (ref 0.0–30.0)
Microalb, Ur: 1 mg/dL (ref 0.0–1.9)

## 2013-11-01 LAB — LIPID PANEL
Cholesterol: 235 mg/dL — ABNORMAL HIGH (ref 0–200)
HDL: 36.5 mg/dL — ABNORMAL LOW (ref >39.00)
LDL Cholesterol: 175 mg/dL — ABNORMAL HIGH (ref 0–99)
Total CHOL/HDL Ratio: 6
Triglycerides: 119 mg/dL (ref 0.0–149.0)
VLDL: 23.8 mg/dL (ref 0.0–40.0)

## 2013-11-01 LAB — HEMOGLOBIN A1C: Hgb A1c MFr Bld: 10.1 % — ABNORMAL HIGH (ref 4.6–6.5)

## 2013-11-08 ENCOUNTER — Ambulatory Visit (INDEPENDENT_AMBULATORY_CARE_PROVIDER_SITE_OTHER): Payer: Medicare HMO | Admitting: Family Medicine

## 2013-11-08 ENCOUNTER — Encounter: Payer: Self-pay | Admitting: Family Medicine

## 2013-11-08 VITALS — BP 148/70 | HR 90 | Temp 98.1°F | Ht 67.0 in | Wt 307.0 lb

## 2013-11-08 DIAGNOSIS — E1165 Type 2 diabetes mellitus with hyperglycemia: Secondary | ICD-10-CM

## 2013-11-08 DIAGNOSIS — R1031 Right lower quadrant pain: Secondary | ICD-10-CM | POA: Insufficient documentation

## 2013-11-08 DIAGNOSIS — IMO0002 Reserved for concepts with insufficient information to code with codable children: Secondary | ICD-10-CM

## 2013-11-08 DIAGNOSIS — E785 Hyperlipidemia, unspecified: Secondary | ICD-10-CM

## 2013-11-08 DIAGNOSIS — E118 Type 2 diabetes mellitus with unspecified complications: Principal | ICD-10-CM

## 2013-11-08 DIAGNOSIS — I10 Essential (primary) hypertension: Secondary | ICD-10-CM

## 2013-11-08 MED ORDER — MELOXICAM 15 MG PO TABS: 15.0000 mg | ORAL_TABLET | Freq: Every day | ORAL | Status: DC

## 2013-11-08 MED ORDER — HYDROCODONE-ACETAMINOPHEN 5-325 MG PO TABS: 1.0000 | ORAL_TABLET | Freq: Four times a day (QID) | ORAL | Status: DC | PRN

## 2013-11-08 NOTE — Assessment & Plan Note (Signed)
No clear hernia. Given severe pain will refer to ortho. Start NSAID and vicodin for breakthrough pain. Ice.

## 2013-11-08 NOTE — Progress Notes (Signed)
Pre visit review using our clinic review tool, if applicable. No additional management support is needed unless otherwise documented below in the visit note. 

## 2013-11-08 NOTE — Progress Notes (Signed)
63 year old male presetns for 3 month follow up.   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 Almost fell and twisted right groin  Occurred 2 days ago. Felt pull and pain, no pop heard. Now pain with movement, sitting, walking...10/10 on pain scale. Hard to sleep. Tried tyelnol.Marland Kitchen. Helped none.   Previously: He had noted association of leg pain with BP medications.  He stopped both BP meds for 4 days and he felt 100% better. ( No change if he stops one at a time, it is both of the medications)  He started them back and symptoms returned.   Hypertension: Now only on HCTZ triamterene given above SE. Improved control.  Doing much better.. No SE on meds. BP Readings from Last 3 Encounters:  11/08/13 148/70  05/31/13 160/80  02/28/13 140/70  Using medication without problems or lightheadedness: None  Chest pain with exertion:  Edema:None  Short of breath:deconditioned.  Average home BPs: 140/90s Other issues:      Given pain today... He will return tpo discuss these other chronic issues like DM, chol..   Other complaints:  Review of Systems  Constitutional: Positive for fatigue.  HENT: Negative for ear discharge.  Eyes: Negative for pain.  Respiratory: Negative for shortness of breath.  Cardiovascular: Negative for chest pain, palpitations and leg swelling.  Gastrointestinal: Negative for abdominal pain.  Has noted some sharp pain 2 weeks ago in left central finger.. Was swollen.. Almost better now.  Objective:   Physical Exam  Constitutional: He is oriented to person, place, and time. Vital signs are normal. He appears well-developed and well-nourished.  Morbidly obese  HENT:  Head: Normocephalic.  Right Ear: Hearing normal.  Left Ear: Hearing normal.  Nose: Nose normal.  Mouth/Throat: Oropharynx is clear and moist and mucous membranes are normal.  Neck: Trachea normal. Carotid bruit is not present. No mass and no thyromegaly present.  Cardiovascular: Normal rate, regular rhythm  and normal pulses. Exam reveals no gallop, no distant heart sounds and no friction rub.  No murmur heard. No peripheral edema  Pulmonary/Chest: Effort normal and breath sounds normal. No respiratory distress.  Abdominal: Soft. Bowel sounds are normal. There is no tenderness.  Neurological: He is alert and oriented to person, place, and time.  Skin: Skin is warm, dry and intact. No rash noted.    MSK: severe pain with ambulating, limping, pain with hip flexion and extension.  NO clear hernia on exam of groin, testes etc.

## 2013-11-08 NOTE — Assessment & Plan Note (Signed)
Improved control on HCTZ triamterene. Not at goal though, but pt in pain today.

## 2013-11-08 NOTE — Patient Instructions (Addendum)
Follow up in 2 weeks for 30 min appt to discuss DM chol etc.  Ice right groin 15 min off and on through day. Start meloxicam anti inflammatory. Vicodin for breakthrough pain. Stop at front desk to get ortho referral.

## 2013-11-11 ENCOUNTER — Telehealth: Payer: Self-pay | Admitting: Family Medicine

## 2013-11-11 NOTE — Telephone Encounter (Signed)
Relevant patient education mailed to patient.  

## 2013-11-22 ENCOUNTER — Ambulatory Visit: Payer: Medicare HMO | Admitting: Family Medicine

## 2013-12-17 ENCOUNTER — Encounter: Payer: Self-pay | Admitting: Family Medicine

## 2013-12-17 ENCOUNTER — Ambulatory Visit (INDEPENDENT_AMBULATORY_CARE_PROVIDER_SITE_OTHER): Payer: Commercial Managed Care - HMO | Admitting: Family Medicine

## 2013-12-17 VITALS — BP 130/58 | HR 79 | Temp 97.9°F | Ht 67.0 in | Wt 307.0 lb

## 2013-12-17 DIAGNOSIS — E1165 Type 2 diabetes mellitus with hyperglycemia: Secondary | ICD-10-CM

## 2013-12-17 DIAGNOSIS — IMO0002 Reserved for concepts with insufficient information to code with codable children: Secondary | ICD-10-CM

## 2013-12-17 DIAGNOSIS — E118 Type 2 diabetes mellitus with unspecified complications: Principal | ICD-10-CM

## 2013-12-17 DIAGNOSIS — R1031 Right lower quadrant pain: Secondary | ICD-10-CM

## 2013-12-17 MED ORDER — INSULIN DETEMIR 100 UNIT/ML FLEXPEN: 80.0000 [IU] | PEN_INJECTOR | Freq: Every day | SUBCUTANEOUS | Status: DC

## 2013-12-17 NOTE — Patient Instructions (Signed)
Stop 70/30. Start levemir flex pen 80 units daily. Check blood sugars fasting in morning (goal FBS < 120) and 2 hours after a meals. Record. Bring next appt. Don't skip meals, try a bedtime snack. Change to whole wheat, decrease potatos, pasta, bread, sweets. Work on walking 3-5 time  A week. Follow up in 1-2 weeks.

## 2013-12-17 NOTE — Progress Notes (Signed)
   Subjective:    Patient ID: [name], male    DOB: June 26, 1951, 63 y.o.   MRN: [id number]  HPI  63 year old male presents for DM follow up.  Groin strain has resolved.   Diabetes:  Poor control  on 70/30  70 units twice daily. Lab Results  Component Value Date   HGBA1C 10.1* 11/01/2013  Using medications without difficulties: Hypoglycemic episodes:None Hyperglycemic episodes: yes Feet problems:None Blood Sugars averaging: FBS 160-210, if skips dinner, low in middle of night. eye exam within last year:   He has insurance now and would like to try a different med.   Review of Systems  Constitutional: Positive for fatigue. Negative for fever.  HENT: Negative for ear pain.   Eyes: Negative for pain.  Respiratory: Negative for shortness of breath.   Cardiovascular: Negative for chest pain and palpitations.  Gastrointestinal: Negative for abdominal pain.       Objective:   Physical Exam  Constitutional: He is oriented to person, place, and time. Vital signs are normal. He appears well-developed and well-nourished.  Morbidly obese  HENT:  Head: Normocephalic.  Right Ear: Hearing normal.  Left Ear: Hearing normal.  Nose: Nose normal.  Mouth/Throat: Oropharynx is clear and moist and mucous membranes are normal.  Neck: Trachea normal. Carotid bruit is not present. No mass and no thyromegaly present.  Cardiovascular: Normal rate, regular rhythm and normal pulses.  Exam reveals no gallop, no distant heart sounds and no friction rub.   No murmur heard. No peripheral edema  Pulmonary/Chest: Effort normal and breath sounds normal. No respiratory distress.  Abdominal: Soft. Bowel sounds are normal. There is no tenderness.  Neurological: He is alert and oriented to person, place, and time.  Skin: Skin is warm, dry and intact. No rash noted.  Psychiatric: He has a normal mood and affect. His speech is normal and behavior is normal. Judgment and thought content normal. Cognition and  memory are normal. He expresses no homicidal and no suicidal ideation. He expresses no suicidal plans and no homicidal plans.  Diabetic foot exam:  inspection: dry flaky skin, ? Fungal infection, pt denies symptoms No skin breakdown Pos calluses  Normal DP pulses Normal sensation to light touch and monofilament Nails thickened         Assessment & Plan:

## 2013-12-17 NOTE — Progress Notes (Signed)
Pre visit review using our clinic review tool, if applicable. No additional management support is needed unless otherwise documented below in the visit note. 

## 2013-12-20 NOTE — Assessment & Plan Note (Signed)
Stop 70/30. Start levemir flex pen 80 units daily. Check blood sugars fasting in morning (goal FBS < 120) and 2 hours after a meals. Record. Bring next appt. Don't skip meals, try a bedtime snack. Change to whole wheat, decrease potatos, pasta, bread, sweets. Work on walking 3-5 time  A week. Follow up in 1-2 weeks.  Total visit time 30 minutes, > 50% spent counseling and cordinating patients care.

## 2013-12-20 NOTE — Assessment & Plan Note (Signed)
Resolved

## 2013-12-24 ENCOUNTER — Ambulatory Visit: Payer: Commercial Managed Care - HMO

## 2013-12-31 ENCOUNTER — Encounter: Payer: Self-pay | Admitting: Family Medicine

## 2013-12-31 ENCOUNTER — Ambulatory Visit (INDEPENDENT_AMBULATORY_CARE_PROVIDER_SITE_OTHER): Payer: Commercial Managed Care - HMO | Admitting: Family Medicine

## 2013-12-31 VITALS — BP 140/72 | HR 86 | Temp 98.0°F | Ht 67.0 in | Wt 303.5 lb

## 2013-12-31 DIAGNOSIS — IMO0002 Reserved for concepts with insufficient information to code with codable children: Secondary | ICD-10-CM

## 2013-12-31 DIAGNOSIS — E1165 Type 2 diabetes mellitus with hyperglycemia: Secondary | ICD-10-CM

## 2013-12-31 DIAGNOSIS — E118 Type 2 diabetes mellitus with unspecified complications: Principal | ICD-10-CM

## 2013-12-31 NOTE — Progress Notes (Signed)
   Subjective:    Patient ID: [name], male    DOB: 09/07/1950, 63 y.o.   MRN: [id number]  HPI  63 year old male with poorly controlled DM presents for 2 week follow up after changing to levemir .  He states that when he stopped 70/30 and started Levemir  80 units his blood sugars increased to 378-555.  He then switched back to 70/30, then 2 days ago increased to 80 unit in morning and 70 units at night. Now yesterday FBS 170,  2 hours post prandial 126,  Last night dropped to 80 at 12 AM,  then today FBS 178  Has tried bedtime snack. Lost 4 lbs , stops sweets. Wt Readings from Last 3 Encounters:  12/31/13 303 lb 8 oz (137.667 kg)  12/17/13 307 lb (139.254 kg)  11/08/13 307 lb (139.254 kg)    He is eating better now. He has started walking He has also been talking to DM nutritionist who is a neightboor.   Legs feeling good.  Did not tolearted victoza and byetta in past.  Metformin cause diarrhea.  HTN not quite at goal. He is working on lifestyle changes. BP Readings from Last 3 Encounters:  12/31/13 140/72  12/17/13 130/58  11/08/13 148/70      Review of Systems  Constitutional: Negative for fever and fatigue.  HENT: Negative for ear pain.   Eyes: Negative for pain.  Respiratory: Negative for shortness of breath.   Cardiovascular: Negative for chest pain.       Objective:   Physical Exam  Constitutional: Vital signs are normal. He appears well-developed and well-nourished.  Obese, morbid  HENT:  Head: Normocephalic.  Right Ear: Hearing normal.  Left Ear: Hearing normal.  Nose: Nose normal.  Mouth/Throat: Oropharynx is clear and moist and mucous membranes are normal.  Neck: Trachea normal. Carotid bruit is not present. No mass and no thyromegaly present.  Cardiovascular: Normal rate, regular rhythm and normal pulses.  Exam reveals no gallop, no distant heart sounds and no friction rub.   No murmur heard. No peripheral edema  Pulmonary/Chest: Effort  normal and breath sounds normal. No respiratory distress.  Skin: Skin is warm, dry and intact. No rash noted.  Psychiatric: He has a normal mood and affect. His speech is normal and behavior is normal. Thought content normal.          Assessment & Plan:

## 2013-12-31 NOTE — Progress Notes (Signed)
Pre visit review using our clinic review tool, if applicable. No additional management support is needed unless otherwise documented below in the visit note. 

## 2013-12-31 NOTE — Assessment & Plan Note (Signed)
Improving control with lifestyle changes.  Did not tolerate levemir.  Increased 70/30 to 80 in AM and 70 Units at PM.  Continue to follow , return in 2 weeks.

## 2013-12-31 NOTE — Patient Instructions (Signed)
Follow up in 2 week. Check and records everyday fasting  and 2 hours after largest meal. Call with any questions. Keep up good work with exercise and low carb diet.

## 2014-01-08 ENCOUNTER — Telehealth: Payer: Self-pay

## 2014-01-08 NOTE — Telephone Encounter (Signed)
Relevant patient education mailed to patient.  

## 2014-01-14 ENCOUNTER — Ambulatory Visit: Payer: Commercial Managed Care - HMO | Admitting: Family Medicine

## 2014-02-25 LAB — HM DIABETES EYE EXAM

## 2014-03-05 ENCOUNTER — Other Ambulatory Visit: Payer: Self-pay | Admitting: Family Medicine

## 2014-03-11 ENCOUNTER — Ambulatory Visit (INDEPENDENT_AMBULATORY_CARE_PROVIDER_SITE_OTHER): Payer: Commercial Managed Care - HMO | Admitting: Family Medicine

## 2014-03-11 ENCOUNTER — Encounter: Payer: Self-pay | Admitting: Family Medicine

## 2014-03-11 VITALS — BP 126/60 | HR 75 | Temp 98.1°F | Ht 67.0 in | Wt 303.8 lb

## 2014-03-11 DIAGNOSIS — E785 Hyperlipidemia, unspecified: Secondary | ICD-10-CM

## 2014-03-11 DIAGNOSIS — R5381 Other malaise: Secondary | ICD-10-CM

## 2014-03-11 DIAGNOSIS — IMO0002 Reserved for concepts with insufficient information to code with codable children: Secondary | ICD-10-CM

## 2014-03-11 DIAGNOSIS — E538 Deficiency of other specified B group vitamins: Secondary | ICD-10-CM

## 2014-03-11 DIAGNOSIS — E1165 Type 2 diabetes mellitus with hyperglycemia: Secondary | ICD-10-CM

## 2014-03-11 DIAGNOSIS — I10 Essential (primary) hypertension: Secondary | ICD-10-CM

## 2014-03-11 DIAGNOSIS — E118 Type 2 diabetes mellitus with unspecified complications: Principal | ICD-10-CM

## 2014-03-11 DIAGNOSIS — R5383 Other fatigue: Secondary | ICD-10-CM

## 2014-03-11 DIAGNOSIS — E291 Testicular hypofunction: Secondary | ICD-10-CM

## 2014-03-11 DIAGNOSIS — E559 Vitamin D deficiency, unspecified: Secondary | ICD-10-CM

## 2014-03-11 LAB — COMPREHENSIVE METABOLIC PANEL
ALT: 23 U/L (ref 0–53)
AST: 20 U/L (ref 0–37)
Albumin: 3.8 g/dL (ref 3.5–5.2)
Alkaline Phosphatase: 66 U/L (ref 39–117)
BUN: 18 mg/dL (ref 6–23)
CO2: 30 mEq/L (ref 19–32)
Calcium: 9.4 mg/dL (ref 8.4–10.5)
Chloride: 102 mEq/L (ref 96–112)
Creatinine, Ser: 0.9 mg/dL (ref 0.4–1.5)
GFR: 87.94 mL/min (ref >60.00)
Glucose, Bld: 187 mg/dL — ABNORMAL HIGH (ref 70–99)
Potassium: 4.4 mEq/L (ref 3.5–5.1)
Sodium: 138 mEq/L (ref 135–145)
Total Bilirubin: 0.7 mg/dL (ref 0.2–1.2)
Total Protein: 7.3 g/dL (ref 6.0–8.3)

## 2014-03-11 LAB — MICROALBUMIN / CREATININE URINE RATIO
Creatinine,U: 247.3 mg/dL
Microalb Creat Ratio: 0.3 mg/g (ref 0.0–30.0)
Microalb, Ur: 0.7 mg/dL (ref 0.0–1.9)

## 2014-03-11 LAB — CBC WITH DIFFERENTIAL/PLATELET
Basophils Absolute: 0 10*3/uL (ref 0.0–0.1)
Basophils Relative: 0.3 % (ref 0.0–3.0)
Eosinophils Absolute: 0.2 10*3/uL (ref 0.0–0.7)
Eosinophils Relative: 2.8 % (ref 0.0–5.0)
HCT: 41.4 % (ref 39.0–52.0)
Hemoglobin: 13.9 g/dL (ref 13.0–17.0)
Lymphocytes Relative: 23 % (ref 12.0–46.0)
Lymphs Abs: 1.6 10*3/uL (ref 0.7–4.0)
MCHC: 33.6 g/dL (ref 30.0–36.0)
MCV: 89.2 fl (ref 78.0–100.0)
Monocytes Absolute: 0.5 10*3/uL (ref 0.1–1.0)
Monocytes Relative: 6.8 % (ref 3.0–12.0)
Neutro Abs: 4.7 10*3/uL (ref 1.4–7.7)
Neutrophils Relative %: 67.1 % (ref 43.0–77.0)
Platelets: 209 10*3/uL (ref 150.0–400.0)
RBC: 4.64 Mil/uL (ref 4.22–5.81)
RDW: 15.1 % (ref 11.5–15.5)
WBC: 6.9 10*3/uL (ref 4.0–10.5)

## 2014-03-11 LAB — LIPID PANEL
Cholesterol: 208 mg/dL — ABNORMAL HIGH (ref 0–200)
HDL: 36.1 mg/dL — ABNORMAL LOW (ref >39.00)
LDL Cholesterol: 150 mg/dL — ABNORMAL HIGH (ref 0–99)
NonHDL: 171.9
Total CHOL/HDL Ratio: 6
Triglycerides: 110 mg/dL (ref 0.0–149.0)
VLDL: 22 mg/dL (ref 0.0–40.0)

## 2014-03-11 LAB — HM DIABETES FOOT EXAM

## 2014-03-11 LAB — HEMOGLOBIN A1C: Hgb A1c MFr Bld: 9.6 % — ABNORMAL HIGH (ref 4.6–6.5)

## 2014-03-11 LAB — TSH: TSH: 1.14 u[IU]/mL (ref 0.35–4.50)

## 2014-03-11 LAB — TESTOSTERONE: Testosterone: 297.4 ng/dL — ABNORMAL LOW (ref 300.00–890.00)

## 2014-03-11 NOTE — Assessment & Plan Note (Signed)
Well controlled. Continue current medication.  

## 2014-03-11 NOTE — Addendum Note (Signed)
Addended by: Alvina ChouWALSH, Elijah Phommachanh J on: 03/11/2014 11:36 AM   Modules accepted: Orders

## 2014-03-11 NOTE — Assessment & Plan Note (Addendum)
Likely poor control SE to statins in past.  Due for lab eval.

## 2014-03-11 NOTE — Progress Notes (Signed)
   Subjective:    Patient ID: Estella HuskJohn H Hossain, male    DOB: 1950-11-24, 63 y.o.   MRN: [id number]  Diabetes Associated symptoms include fatigue. Pertinent negatives for diabetes include no chest pain.   63 year old male presents for follow up DM, HTN and high chol.  Diabetes:  Due for re-eval a1C. Poor control previously. Did not tolearted victoza and byetta in past.  Metformin cause diarrhea.  Did not tolerate levemir. Now back on 70/30 80 units in AM and 70 Units in PM Using medications without difficulties: None Hypoglycemic episodes:None Hyperglycemic episodes: Yes Feet problems: None Blood Sugars averaging: FBS 101, 2 post prandial 220 eye exam within last year: Has appt tommorow  He is eating better now.  He has started walking  He has also been talking to DM nutritionist who is a neightboor   Elevated Cholesterol: Due for re-eval. Poor control previously.  HX of CAD.Marland Kitchen. Gaol LDL  < 70. Using medications without problems: Muscle aches:  Diet compliance: Exercise: Other complaints:  Hypertension:   Well controlled on dyazide alone! BP Readings from Last 3 Encounters:  03/11/14 126/60  12/31/13 140/72  12/17/13 130/58  Using medication without problems or lightheadedness:  Chest pain with exertion: Edema: Short of breath: Average home BPs: Other issues:  Wt Readings from Last 3 Encounters:  03/11/14 303 lb 12 oz (137.78 kg)  12/31/13 303 lb 8 oz (137.667 kg)  12/17/13 307 lb (139.254 kg)    Low testosterone: Low energy.. Cannot afford test , does not like gel as cannot be near females.    Review of Systems  Constitutional: Positive for fatigue. Negative for fever.  HENT: Negative for ear pain.   Eyes: Negative for pain.  Respiratory: Negative for cough and shortness of breath.   Cardiovascular: Negative for chest pain, palpitations and leg swelling.  Gastrointestinal: Negative for abdominal pain.       Objective:   Physical Exam  Constitutional: He is  oriented to person, place, and time. Vital signs are normal. He appears well-developed and well-nourished.  Morbid obesity  HENT:  Head: Normocephalic.  Right Ear: Hearing normal.  Left Ear: Hearing normal.  Nose: Nose normal.  Mouth/Throat: Oropharynx is clear and moist and mucous membranes are normal.  Neck: Trachea normal. Carotid bruit is not present. No mass and no thyromegaly present.  Cardiovascular: Normal rate, regular rhythm and normal pulses.  Exam reveals no gallop, no distant heart sounds and no friction rub.   No murmur heard. No peripheral edema  Pulmonary/Chest: Effort normal and breath sounds normal. No respiratory distress.  Neurological: He is alert and oriented to person, place, and time.  Skin: Skin is warm, dry and intact. No rash noted.  Psychiatric: He has a normal mood and affect. His speech is normal and behavior is normal. Thought content normal.    Diabetic foot exam: Dry skin No skin breakdown No calluses  Normal DP pulses Normal sensation to light touch and monofilament Nails thickened       Assessment & Plan:

## 2014-03-11 NOTE — Addendum Note (Signed)
Addended by: Alvina ChouWALSH, TERRI J on: 03/11/2014 10:27 AM   Modules accepted: Orders

## 2014-03-11 NOTE — Assessment & Plan Note (Signed)
Improving control current 70/30.  Due for lab eval.

## 2014-03-11 NOTE — Assessment & Plan Note (Signed)
Re-eval for secondary causes.

## 2014-03-11 NOTE — Patient Instructions (Addendum)
Stop at lab on way out. We will call with recommendations. Work on slowly starting some exercise and continue low carb diet.

## 2014-03-11 NOTE — Assessment & Plan Note (Signed)
Due for lab eval.

## 2014-03-11 NOTE — Progress Notes (Signed)
Pre visit review using our clinic review tool, if applicable. No additional management support is needed unless otherwise documented below in the visit note. 

## 2014-03-12 ENCOUNTER — Telehealth: Payer: Self-pay | Admitting: Family Medicine

## 2014-03-12 LAB — VITAMIN D 25 HYDROXY (VIT D DEFICIENCY, FRACTURES): VITD: 36.04 ng/mL

## 2014-03-12 LAB — VITAMIN B12: Vitamin B-12: 242 pg/mL (ref 211–911)

## 2014-03-12 NOTE — Telephone Encounter (Signed)
Relevant patient education mailed to patient.  

## 2014-05-16 ENCOUNTER — Encounter: Payer: Self-pay | Admitting: Internal Medicine

## 2014-06-04 ENCOUNTER — Other Ambulatory Visit: Payer: Self-pay | Admitting: Family Medicine

## 2014-06-13 ENCOUNTER — Ambulatory Visit: Payer: Commercial Managed Care - HMO | Admitting: Family Medicine

## 2014-07-09 ENCOUNTER — Ambulatory Visit (INDEPENDENT_AMBULATORY_CARE_PROVIDER_SITE_OTHER): Payer: Commercial Managed Care - HMO

## 2014-07-09 DIAGNOSIS — Z23 Encounter for immunization: Secondary | ICD-10-CM

## 2014-08-21 ENCOUNTER — Ambulatory Visit: Payer: Commercial Managed Care - HMO | Admitting: Family Medicine

## 2014-08-23 LAB — HM DIABETES EYE EXAM

## 2014-08-26 ENCOUNTER — Other Ambulatory Visit: Payer: Self-pay | Admitting: Family Medicine

## 2014-09-23 ENCOUNTER — Encounter: Payer: Self-pay | Admitting: Family Medicine

## 2014-09-23 ENCOUNTER — Ambulatory Visit (INDEPENDENT_AMBULATORY_CARE_PROVIDER_SITE_OTHER): Payer: Commercial Managed Care - HMO | Admitting: Family Medicine

## 2014-09-23 ENCOUNTER — Ambulatory Visit (INDEPENDENT_AMBULATORY_CARE_PROVIDER_SITE_OTHER)
Admission: RE | Admit: 2014-09-23 | Discharge: 2014-09-23 | Disposition: A | Discharge status: Home / Self Care | Payer: Commercial Managed Care - HMO | Source: Ambulatory Visit | Attending: Family Medicine | Admitting: Family Medicine

## 2014-09-23 VITALS — BP 136/76 | HR 88 | Temp 98.3°F | Ht 67.0 in | Wt 307.5 lb

## 2014-09-23 DIAGNOSIS — E113399 Type 2 diabetes mellitus with moderate nonproliferative diabetic retinopathy without macular edema, unspecified eye: Secondary | ICD-10-CM

## 2014-09-23 DIAGNOSIS — G8929 Other chronic pain: Secondary | ICD-10-CM | POA: Insufficient documentation

## 2014-09-23 DIAGNOSIS — M25561 Pain in right knee: Secondary | ICD-10-CM

## 2014-09-23 DIAGNOSIS — F331 Major depressive disorder, recurrent, moderate: Secondary | ICD-10-CM

## 2014-09-23 DIAGNOSIS — E785 Hyperlipidemia, unspecified: Secondary | ICD-10-CM

## 2014-09-23 DIAGNOSIS — E118 Type 2 diabetes mellitus with unspecified complications: Secondary | ICD-10-CM

## 2014-09-23 DIAGNOSIS — I10 Essential (primary) hypertension: Secondary | ICD-10-CM

## 2014-09-23 DIAGNOSIS — E1165 Type 2 diabetes mellitus with hyperglycemia: Secondary | ICD-10-CM

## 2014-09-23 DIAGNOSIS — E11319 Type 2 diabetes mellitus with unspecified diabetic retinopathy without macular edema: Secondary | ICD-10-CM | POA: Insufficient documentation

## 2014-09-23 DIAGNOSIS — IMO0002 Reserved for concepts with insufficient information to code with codable children: Secondary | ICD-10-CM

## 2014-09-23 DIAGNOSIS — E11339 Type 2 diabetes mellitus with moderate nonproliferative diabetic retinopathy without macular edema: Secondary | ICD-10-CM

## 2014-09-23 LAB — LIPID PANEL
Cholesterol: 225 mg/dL — ABNORMAL HIGH (ref 0–200)
HDL: 35.7 mg/dL — ABNORMAL LOW (ref >39.00)
LDL Cholesterol: 159 mg/dL — ABNORMAL HIGH (ref 0–99)
NonHDL: 189.3
Total CHOL/HDL Ratio: 6
Triglycerides: 154 mg/dL — ABNORMAL HIGH (ref 0.0–149.0)
VLDL: 30.8 mg/dL (ref 0.0–40.0)

## 2014-09-23 LAB — COMPREHENSIVE METABOLIC PANEL
ALT: 18 U/L (ref 0–53)
AST: 16 U/L (ref 0–37)
Albumin: 4.1 g/dL (ref 3.5–5.2)
Alkaline Phosphatase: 77 U/L (ref 39–117)
BUN: 19 mg/dL (ref 6–23)
CO2: 30 mEq/L (ref 19–32)
Calcium: 9.8 mg/dL (ref 8.4–10.5)
Chloride: 101 mEq/L (ref 96–112)
Creatinine, Ser: 1.02 mg/dL (ref 0.40–1.50)
GFR: 77.94 mL/min (ref >60.00)
Glucose, Bld: 243 mg/dL — ABNORMAL HIGH (ref 70–99)
Potassium: 4.9 mEq/L (ref 3.5–5.1)
Sodium: 138 mEq/L (ref 135–145)
Total Bilirubin: 0.4 mg/dL (ref 0.2–1.2)
Total Protein: 7.6 g/dL (ref 6.0–8.3)

## 2014-09-23 LAB — HEMOGLOBIN A1C: Hgb A1c MFr Bld: 10.1 % — ABNORMAL HIGH (ref 4.6–6.5)

## 2014-09-23 LAB — HM DIABETES FOOT EXAM

## 2014-09-23 MED ORDER — BUPROPION HCL ER (XL) 150 MG PO TB24: 150.0000 mg | ORAL_TABLET | Freq: Every day | ORAL | Status: DC

## 2014-09-23 MED ORDER — DICLOFENAC SODIUM 75 MG PO TBEC: 75.0000 mg | DELAYED_RELEASE_TABLET | Freq: Two times a day (BID) | ORAL | Status: DC

## 2014-09-23 NOTE — Progress Notes (Signed)
64 year old male presents for 3 month follow up. Last appt was 6 months ago.  He is now experiencing right knee pain. He " messed up " his right knee on 08/17/2014.  He stepped down out of barn, no fall, no twist. Stepped down hard. No pop. Knee just gave out and leaned against dryer.  Had swelling initially, but still bigger in posterior knee.  Used crutches for two week, could not afford to come to doctor.  Since then pain at rest and with walking. Worse after up on knee alot. Gets stiff after sitting a long time.  He  has pain in  Anterior kne greatest medially. He has been using advil or tylenol... Helps minimally. He most to stay off knee.   He does have  issue with decreased motion in right knee.  Diabetes: Due for re-eval a1C. Poor control previously. Lab Results  Component Value Date   HGBA1C 9.6* 03/11/2014  Did not tolearte victoza and byetta in past.  Metformin cause diarrhea. Did not tolerate levemir. Now back on 70/30 80 units in AM and 70 Units in PM Using medications without difficulties: None Hypoglycemic episodes:None Hyperglycemic episodes: Yes Feet problems: None Blood Sugars averaging: FBS 204, 2 post prandially not checking eye exam within last year: Has appt tommorow  He   Elevated Cholesterol:Due for re-eval. Poor control previously. SE to statins in past. Lab Results  Component Value Date   CHOL 208* 03/11/2014   HDL 36.10* 03/11/2014   LDLCALC 150* 03/11/2014   LDLDIRECT 186.9 05/31/2013   TRIG 110.0 03/11/2014   CHOLHDL 6 03/11/2014  HX of CAD.Marland Kitchen. Goal LDL < 70. Using medications without problems: Muscle aches:  Diet compliance: Exercise: Other complaints:  Hypertension:  Well controlled on dyazide alone! BP Readings from Last 3 Encounters:  09/23/14 136/76  03/11/14 126/60  12/31/13 140/72  Using medication without problems or lightheadedness:  None Chest pain with exertion:None Edema:occ Short of breath:None Average home BPs: not  checking Other issues:  Wt Readings from Last 3 Encounters:  09/23/14 307 lb 8 oz (139.481 kg)  03/11/14 303 lb 12 oz (137.78 kg)  12/31/13 303 lb 8 oz (137.667 kg)    He states that his vision has worsened.from chronic eye disease.. Due to pasmt elevated BP and DM. Getting injections in eye. Cataract surgery helped some in last 6 months. Tearful frequently ongoing fpor years worse in last few months. He has decreased motivation. He " just does not care"  "I don't care about me health. I don't care to make lifestyle changes." More irritable lately.   Mood is getting worse. No SI. He is not sleeping well at night. He has low testosterone but does not want to treat.    Constitutional: Positive for fatigue. Negative for fever.  HENT: Negative for ear pain.  Eyes: Negative for pain.  Respiratory: Negative for cough and shortness of breath.  Cardiovascular: Negative for chest pain, palpitations and leg swelling.  Gastrointestinal: Negative for abdominal pain.       Objective:   Physical Exam  Constitutional: He is oriented to person, place, and time. Vital signs are normal. He appears well-developed and well-nourished.  Morbid obesity  HENT:  Head: Normocephalic.  Right Ear: Hearing normal.  Left Ear: Hearing normal.  Nose: Nose normal.  Mouth/Throat: Oropharynx is clear and moist and mucous membranes are normal.  Neck: Trachea normal. Carotid bruit is not present. No mass and no thyromegaly present.  Cardiovascular: Normal rate, regular rhythm and  normal pulses. Exam reveals no gallop, no distant heart sounds and no friction rub.  No murmur heard. No peripheral edema  Pulmonary/Chest: Effort normal and breath sounds normal. No respiratory distress.  Neurological: He is alert and oriented to person, place, and time.  Skin: Skin is warm, dry and intact. No rash noted.  Psychiatric: He has sad and tearful mood and  Depressed affect. His speech is normal and behavior is  slowed. Thought content normal.  Poor judgement.  NO SI, No HI.  Riht knee, mild effusion, ttp in right medial joint line., no redness, neg Mcmurray's, neg ant/post drawer. No apin over medical collateral ligament. Decrease ROM in right knee.   Diabetic foot exam: Dry skin No skin breakdown No calluses  Normal DP pulses Normal sensation to light touch and monofilament Nails thickened

## 2014-09-23 NOTE — Assessment & Plan Note (Signed)
Due fpor re-eval. liekly poor control. Need to treat depression to get pt motivated to care abotu working on DM. Continue insulin at current dose for now.

## 2014-09-23 NOTE — Assessment & Plan Note (Signed)
Due for re-eval. 

## 2014-09-23 NOTE — Assessment & Plan Note (Signed)
Likely due to OA flare. Will eval with X-ray. Start NSAID. May need steroid injection. Reocmmended weight loss.

## 2014-09-23 NOTE — Assessment & Plan Note (Signed)
Well controlled. Continue current medication.  

## 2014-09-23 NOTE — Progress Notes (Signed)
Pre visit review using our clinic review tool, if applicable. No additional management support is needed unless otherwise documented below in the visit note. 

## 2014-09-23 NOTE — Assessment & Plan Note (Signed)
Manifested mainly as lack of motivation. Start wellbutrin Xl daily. Follow up in 1 month.

## 2014-09-23 NOTE — Patient Instructions (Addendum)
Stop at lab on way out. We will call with X-ray results. Start diclofenac twice daily for knee pain. Do not take aspirin or ibuprofen or aleve with this. Start wellbutrin (also known as bupropion) in the morning. Call if too expensive. Start exercise (walking) as much as you can tolerate with your knee.  Follow up in 1 month mood. Call in 2 weeks if knee pain is not better.

## 2014-09-26 NOTE — Addendum Note (Signed)
Addended by: Damita LackLORING, Babbette Dalesandro S on: 09/26/2014 01:02 PM   Modules accepted: Medications

## 2014-10-21 ENCOUNTER — Other Ambulatory Visit: Payer: Self-pay | Admitting: Family Medicine

## 2014-10-21 NOTE — Telephone Encounter (Signed)
Last office visit 09/23/2014.  Last refilled 09/23/2014 for #30 with no refills.  Ok to refill?

## 2014-10-24 ENCOUNTER — Encounter: Payer: Self-pay | Admitting: Family Medicine

## 2014-10-24 ENCOUNTER — Ambulatory Visit (INDEPENDENT_AMBULATORY_CARE_PROVIDER_SITE_OTHER): Payer: Commercial Managed Care - HMO | Admitting: Family Medicine

## 2014-10-24 VITALS — BP 138/64 | HR 90 | Temp 98.3°F | Ht 67.0 in | Wt 314.5 lb

## 2014-10-24 DIAGNOSIS — E1165 Type 2 diabetes mellitus with hyperglycemia: Secondary | ICD-10-CM

## 2014-10-24 DIAGNOSIS — F331 Major depressive disorder, recurrent, moderate: Secondary | ICD-10-CM

## 2014-10-24 DIAGNOSIS — IMO0002 Reserved for concepts with insufficient information to code with codable children: Secondary | ICD-10-CM

## 2014-10-24 DIAGNOSIS — E11319 Type 2 diabetes mellitus with unspecified diabetic retinopathy without macular edema: Secondary | ICD-10-CM

## 2014-10-24 NOTE — Patient Instructions (Signed)
Icnrease exercise.. Try to walk some daily or 150 min per week. Decrease bread each day, decrease sweets to 2 times a week.  Decrease fast food to 1 day per week.  B12 10  mcg daily and b complex.

## 2014-10-24 NOTE — Assessment & Plan Note (Signed)
Pt states did not tolerate wellbutrin. No interested in another med.  Feels mood is some better with weather improvement.

## 2014-10-24 NOTE — Progress Notes (Signed)
Pre visit review using our clinic review tool, if applicable. No additional management support is needed unless otherwise documented below in the visit note. 

## 2014-10-24 NOTE — Assessment & Plan Note (Signed)
Pt seems to be more motivated than previously. Disucussed lifestyle changes in detail and set out goal plan for pyt. Follow up in 2 months.

## 2014-10-24 NOTE — Progress Notes (Signed)
   Subjective:    Patient ID: [name], male    DOB: August 21, 1951, 64 y.o.   MRN: [id number]  HPI   64 year old male presents for 1 month follow up on depression and fatigue after initiating wellbutrin XL. He reports that he felt it made him mean. He took it for about 15 days. He felt like it made him bloated as well.  BP Readings from Last 3 Encounters:  10/24/14 138/64  09/23/14 136/76  03/11/14 126/60   He feels better overall because  Weather is brighter. He denies SI. He would like to try to lose weight loss. He feel more motivated to exercise. His right knee is much better than before. Wt Readings from Last 3 Encounters:  10/24/14 314 lb 8 oz (142.656 kg)  09/23/14 307 lb 8 oz (139.481 kg)  03/11/14 303 lb 12 oz (137.78 kg)        Review of Systems  Constitutional: Negative for fatigue.  HENT: Negative for ear pain.   Eyes: Negative for pain.  Respiratory: Negative for cough.   Cardiovascular: Negative for chest pain.       Objective:   Physical Exam  Constitutional: He is oriented to person, place, and time. Vital signs are normal. He appears well-developed and well-nourished.  Morbid obesity  HENT:  Head: Normocephalic.  Right Ear: Hearing normal.  Left Ear: Hearing normal.  Nose: Nose normal.  Mouth/Throat: Oropharynx is clear and moist and mucous membranes are normal.  Neck: Trachea normal. Carotid bruit is not present. No thyroid mass and no thyromegaly present.  Cardiovascular: Normal rate, regular rhythm and normal pulses.  Exam reveals no gallop, no distant heart sounds and no friction rub.   No murmur heard. No peripheral edema  Pulmonary/Chest: Effort normal and breath sounds normal. No respiratory distress.  Neurological: He is alert and oriented to person, place, and time.  Skin: Skin is warm, dry and intact. No rash noted.  Psychiatric: He has a normal mood and affect. His speech is normal and behavior is normal. Thought content normal.           Assessment & Plan:

## 2014-11-17 DIAGNOSIS — H3581 Retinal edema: Secondary | ICD-10-CM | POA: Diagnosis not present

## 2014-11-17 DIAGNOSIS — H34813 Central retinal vein occlusion, bilateral: Secondary | ICD-10-CM | POA: Diagnosis not present

## 2014-11-17 DIAGNOSIS — H3582 Retinal ischemia: Secondary | ICD-10-CM | POA: Diagnosis not present

## 2014-11-17 DIAGNOSIS — E11339 Type 2 diabetes mellitus with moderate nonproliferative diabetic retinopathy without macular edema: Secondary | ICD-10-CM | POA: Diagnosis not present

## 2014-11-20 ENCOUNTER — Other Ambulatory Visit: Payer: Self-pay | Admitting: Family Medicine

## 2014-11-20 NOTE — Telephone Encounter (Signed)
Last office visit 10/24/2014.  Last refilled 10/21/2014 for #30 with no refills.  Ok to refill?

## 2014-12-08 ENCOUNTER — Other Ambulatory Visit: Payer: Self-pay | Admitting: Family Medicine

## 2014-12-12 ENCOUNTER — Other Ambulatory Visit: Payer: Self-pay | Admitting: Family Medicine

## 2014-12-12 NOTE — Telephone Encounter (Signed)
Last office visit 10/24/2014.  Last refilled 11/20/2014 for #30.  Should we increase quantity to #60 since directions are to take twice a day?  Ok to refill?

## 2014-12-23 ENCOUNTER — Other Ambulatory Visit: Payer: Self-pay | Admitting: Family Medicine

## 2014-12-23 ENCOUNTER — Ambulatory Visit (INDEPENDENT_AMBULATORY_CARE_PROVIDER_SITE_OTHER): Payer: Commercial Managed Care - HMO | Admitting: Family Medicine

## 2014-12-23 ENCOUNTER — Encounter: Payer: Self-pay | Admitting: Family Medicine

## 2014-12-23 VITALS — BP 148/70 | HR 79 | Temp 97.8°F | Ht 67.0 in | Wt 303.5 lb

## 2014-12-23 DIAGNOSIS — F331 Major depressive disorder, recurrent, moderate: Secondary | ICD-10-CM | POA: Diagnosis not present

## 2014-12-23 DIAGNOSIS — R0683 Snoring: Secondary | ICD-10-CM

## 2014-12-23 DIAGNOSIS — E785 Hyperlipidemia, unspecified: Secondary | ICD-10-CM | POA: Diagnosis not present

## 2014-12-23 DIAGNOSIS — IMO0002 Reserved for concepts with insufficient information to code with codable children: Secondary | ICD-10-CM

## 2014-12-23 DIAGNOSIS — E1165 Type 2 diabetes mellitus with hyperglycemia: Secondary | ICD-10-CM

## 2014-12-23 DIAGNOSIS — I10 Essential (primary) hypertension: Secondary | ICD-10-CM | POA: Diagnosis not present

## 2014-12-23 DIAGNOSIS — E11319 Type 2 diabetes mellitus with unspecified diabetic retinopathy without macular edema: Secondary | ICD-10-CM

## 2014-12-23 DIAGNOSIS — M25561 Pain in right knee: Secondary | ICD-10-CM

## 2014-12-23 LAB — LIPID PANEL
Cholesterol: 237 mg/dL — ABNORMAL HIGH (ref 0–200)
HDL: 35.5 mg/dL — ABNORMAL LOW (ref >39.00)
LDL Cholesterol: 173 mg/dL — ABNORMAL HIGH (ref 0–99)
NonHDL: 201.5
Total CHOL/HDL Ratio: 7
Triglycerides: 145 mg/dL (ref 0.0–149.0)
VLDL: 29 mg/dL (ref 0.0–40.0)

## 2014-12-23 LAB — COMPREHENSIVE METABOLIC PANEL
ALT: 23 U/L (ref 0–53)
AST: 16 U/L (ref 0–37)
Albumin: 4.1 g/dL (ref 3.5–5.2)
Alkaline Phosphatase: 73 U/L (ref 39–117)
BUN: 21 mg/dL (ref 6–23)
CO2: 32 mEq/L (ref 19–32)
Calcium: 9.2 mg/dL (ref 8.4–10.5)
Chloride: 101 mEq/L (ref 96–112)
Creatinine, Ser: 0.93 mg/dL (ref 0.40–1.50)
GFR: 86.64 mL/min (ref >60.00)
Glucose, Bld: 206 mg/dL — ABNORMAL HIGH (ref 70–99)
Potassium: 3.9 mEq/L (ref 3.5–5.1)
Sodium: 138 mEq/L (ref 135–145)
Total Bilirubin: 0.3 mg/dL (ref 0.2–1.2)
Total Protein: 7.1 g/dL (ref 6.0–8.3)

## 2014-12-23 LAB — HM DIABETES FOOT EXAM

## 2014-12-23 LAB — HEMOGLOBIN A1C: Hgb A1c MFr Bld: 9.2 % — ABNORMAL HIGH (ref 4.6–6.5)

## 2014-12-23 MED ORDER — PIOGLITAZONE HCL 30 MG PO TABS: 30.0000 mg | ORAL_TABLET | Freq: Every day | ORAL | Status: DC

## 2014-12-23 MED ORDER — HYDROCODONE-ACETAMINOPHEN 5-325 MG PO TABS: 1.0000 | ORAL_TABLET | Freq: Two times a day (BID) | ORAL | Status: DC | PRN

## 2014-12-23 MED ORDER — SERTRALINE HCL 50 MG PO TABS: 50.0000 mg | ORAL_TABLET | Freq: Every day | ORAL | Status: DC

## 2014-12-23 NOTE — Assessment & Plan Note (Signed)
Due for re-eval. 

## 2014-12-23 NOTE — Assessment & Plan Note (Signed)
Due for re-eval. Likely poor control. Will need to adjust medication.

## 2014-12-23 NOTE — Progress Notes (Signed)
64 year old male presents for 3 month follow up. Last appt was 6 months ago.  He is continuing to have right  right knee pain. He " messed up " his right knee on 08/17/2014. He stepped down out of barn, no fall, no twist. Stepped down hard. No pop. Knee just gave out and leaned against dryer. Had swelling initially, but still bigger in posterior knee. Used crutches for two week, could not afford to come to doctor.   He was having good and bad days, but now pain is continued. He cannot sleep at night given knee pain. Gradually worse in last month. He has pain in Anterior knee greatest medially. Occ popping. Cannot bend knee fully. He has diclofenac twice a day. He mostly to stay off knee.  Took some of his wife tramadol , did not help much.  At last OV prescribed NSAID, X-ray showed: swelling in tissues and possible loose body.  Recommended steroid inj/ referral to Dr. Salena Saner. May also need MRI eval /arthroscopy  He reports he continues to feel exhausted. No desire to do anything. . No energy to do anything. He feels depressed and sad. Has anhedonia. Low testosterone in past.  Snores at night.  Sits on couch everyday.  Did not tolerate wellbutrin in past.  Diabetes:  Due for re-eval. Likely poorly controlled on 70/30 insulin. Lab Results  Component Value Date   HGBA1C 10.1* 09/23/2014  Did not tolearte victoza and byetta in past.  Metformin cause diarrhea. Did not tolerate levemir. On 70/30 80 units in AM and 75 Units in PM Using medications without difficulties: None Hypoglycemic episodes:None Hyperglycemic episodes: Yes Feet problems: None Blood Sugars averaging: FBS 276, 2 post prandially not checking eye exam within last year:  07/2014  Elevated Cholesterol: Due for re-eval. Poor control previously. SE to statins in past. HX of CAD.Marland Kitchen. Goal LDL < 70. Using medications without problems: Muscle aches:  Diet compliance:poor Exercise none   HTN, borderline control,  previously well controlled on  Dyazide. BP Readings from Last 3 Encounters:  12/23/14 148/70  10/24/14 138/64  09/23/14 136/76     Constitutional: Positive for fatigue. Negative for fever.  HENT: Negative for ear pain.  Eyes: Negative for pain.  Respiratory: Negative for cough and shortness of breath.  Cardiovascular: Negative for chest pain, palpitations and leg swelling.  Gastrointestinal: Negative for abdominal pain.      Objective:  Physical Exam  Constitutional: He is oriented to person, place, and time. Vital signs are normal. He appears well-developed and well-nourished.  Morbid obesity  HENT:  Head: Normocephalic.  Right Ear: Hearing normal.  Left Ear: Hearing normal.  Nose: Nose normal.  Mouth/Throat: Oropharynx is clear and moist and mucous membranes are normal.  Neck: Trachea normal. Carotid bruit is not present. No mass and no thyromegaly present.  Cardiovascular: Normal rate, regular rhythm and normal pulses. Exam reveals no gallop, no distant heart sounds and no friction rub.  No murmur heard. 1 plus peripheral edema  Pulmonary/Chest: Effort normal and breath sounds normal. No respiratory distress.  Neurological: He is alert and oriented to person, place, and time.  Skin: Skin is warm, dry and intact. No rash noted.  Psychiatric: He has sad and tearful mood and Depressed affect. His speech is normal and behavior is slowed. Thought content normal. Poor judgement. NO SI, No HI.  Right knee, mild effusion, ttp in right medial joint line., no redness, neg Mcmurray's, neg ant/post drawer. No pain over medical collateral ligament.  Decrease ROM in right knee.   Diabetic foot exam: Dry skin No skin breakdown  Calluses  On heels Normal DP pulses Normal sensation to light touch and monofilament Nails thickened

## 2014-12-23 NOTE — Assessment & Plan Note (Signed)
Concern for meniscal tear. Will refer to Dr. Patsy Lageropland for further eval and possible MRI.

## 2014-12-23 NOTE — Assessment & Plan Note (Signed)
Borderline control on dyazide.

## 2014-12-23 NOTE — Progress Notes (Signed)
Pre visit review using our clinic review tool, if applicable. No additional management support is needed unless otherwise documented below in the visit note. 

## 2014-12-23 NOTE — Patient Instructions (Addendum)
Stop at lab on way out. Work on increasing exercise as tolerated once knee improved. Decrease portion size.  Push water intake. Avoid soda, juice, sweet tea. Stop at front desk to set up referral for sleep testing. Start sertraline 50 mg daily.  Follow up in 1 month 30 min OV.

## 2014-12-23 NOTE — Assessment & Plan Note (Signed)
Poor control. Will start trial of sertraline.

## 2014-12-24 ENCOUNTER — Encounter: Payer: Self-pay | Admitting: *Deleted

## 2014-12-24 MED ORDER — GLUCOSE BLOOD VI STRP: ORAL_STRIP | Status: DC

## 2014-12-24 MED ORDER — LANCETS MISC: Status: DC

## 2014-12-24 NOTE — Addendum Note (Signed)
Addended by: Damita LackLORING, Tamre Cass S on: 12/24/2014 01:01 PM   Modules accepted: Orders

## 2014-12-31 ENCOUNTER — Ambulatory Visit (INDEPENDENT_AMBULATORY_CARE_PROVIDER_SITE_OTHER): Payer: Commercial Managed Care - HMO | Admitting: Family Medicine

## 2014-12-31 ENCOUNTER — Encounter: Payer: Self-pay | Admitting: Family Medicine

## 2014-12-31 VITALS — BP 140/86 | HR 82 | Temp 98.0°F | Ht 67.0 in | Wt 311.2 lb

## 2014-12-31 DIAGNOSIS — M25561 Pain in right knee: Secondary | ICD-10-CM | POA: Diagnosis not present

## 2014-12-31 DIAGNOSIS — M2391 Unspecified internal derangement of right knee: Secondary | ICD-10-CM

## 2014-12-31 MED ORDER — METHYLPREDNISOLONE ACETATE 40 MG/ML IJ SUSP
80.0000 mg | Freq: Once | INTRAMUSCULAR | Status: AC
  Administered 2014-12-31: 80 mg via INTRA_ARTICULAR

## 2014-12-31 NOTE — Patient Instructions (Signed)
After you have an injection of any joint, the numbing medicine will make it feel better for a few hours. Later tonight, it is common for the joint to feel worse. The steroid will take 48-72 hours to start working - it is the thing that will likely provide the most relief.  Ice the joint where you had the injection at least 2-3 times a day for 20 minutes for 3 days. If have had swelling, pain in the joint itself, ice for 1 week.  You can use an ice bag, frozen peas or corn, an ice pack - all work  

## 2014-12-31 NOTE — Progress Notes (Signed)
Pre visit review using our clinic review tool, if applicable. No additional management support is needed unless otherwise documented below in the visit note. 

## 2014-12-31 NOTE — Progress Notes (Signed)
Dr. Karleen HampshireSpencer T. Emil Weigold, MD, CAQ [facility] [address] [place], [id number] Phone: [phone] Fax: [phone]  12/31/2014  Patient: William HuskJohn H [name], MRN: [id number], DOB: 1951/07/01, 64 y.o.  Primary Physician:  William NoraAmy Bedsole, MD  Chief Complaint: Right Medial Knee Pain  Subjective:   [name] is a 64 y.o. very pleasant male patient who presents with the following:  Consulting MD: Dr. Ermalene SearingBedsole Reason: R knee pain  5-6 months ago, he was coming out of barn, and knee gave out on it and stepped down on it. Could not put any pressure on it, and it has been that way off and on. Hurts 95% of the time. Today, the patient's symptoms are actually a little bit better, but generally he is very impaired in his day-to-day life.  He has not had any locking up of the joint, but he does primarily have medial knee pain.  He has had some intermittent swelling.  He occasionally feels like it might give way.  Never had old knee injuries.  Doing some lawn service.   Past Medical History, Surgical History, Social History, Family History, Problem List, Medications, and Allergies have been reviewed and updated if relevant.  GEN: No fevers, chills. Nontoxic. Primarily MSK c/o today. MSK: Detailed in the HPI GI: tolerating PO intake without difficulty Neuro: No numbness, parasthesias, or tingling associated. Otherwise the pertinent positives of the ROS are noted above.   Objective:   BP 140/86 mmHg  Pulse 82  Temp(Src) 98 F (36.7 C) (Oral)  Ht 5\' 7"  (1.702 m)  Wt 311 lb 4 oz (141.182 kg)  BMI 48.74 kg/m2  SpO2 92%   GEN: WDWN, NAD, Non-toxic, Alert & Oriented x 3 HEENT: Atraumatic, Normocephalic.  Ears and Nose: No external deformity. EXTR: No clubbing/cyanosis/edema NEURO: Normal gait, mild antalgia PSYCH: Normally interactive. Conversant. Not depressed or anxious appearing.  Calm demeanor.    Right knee: The patient lacks 4 of extension.   Flexion to 110.  He does have some tenderness on the medial joint line.  Lateral joint line is nontender.  Stable MCL and LCL.  Lachman is negative.  Anterior and posterior drawer testing is negative.  Other special testing is equivocal given the patient's effusion.  He does have a moderate sized effusion.  Radiology: EXAM: RIGHT KNEE - COMPLETE 4+ VIEW  COMPARISON: None.  FINDINGS: Mild prepatellar soft tissue swelling is noted. Lucency noted along the superior lateral aspect of the patella most likely secondary to a bipartite patella as opposed to patellar fracture. Tiny bony density noted in the infrapatellar region, this could be a tiny loose body. No other focal abnormality identified.  IMPRESSION: 1. 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 2. Prepatellar soft tissue swelling. 3. Tiny bony density noted inferior to patella. Tiny loose body cannot be excluded.   Electronically Signed  By: [name]  On: 09/23/2014 09:43  Assessment and Plan:   Right knee pain - Plan: methylPREDNISolone acetate (DEPO-MEDROL) injection 80 mg  Internal derangement of right knee  I'm doubtful that the very very small radiopacity is contributing to the patient's symptoms.  Bipartite patella is a normal variant.  Radiographically, the patient has minimal osteoarthritic findings. Suspect probable meniscal tear, may be degenerative in nature.  It is worthwhile trying more conservative measures.  He has been doing some oral NSAIDs.  Continue icing as needed.  Aspirate and inject the patient's  right knee.  Recheck in 4 weeks.  Knee Aspiration and Injection, RIGHT Patient verbally consented; risks, benefits, and alternatives explained including possible infection. Patient prepped with Chloraprep. Ethyl chloride for anesthesia. 10 cc of 1% Lidocaine used in wheal then injected Subcutaneous fashion with 22 gauge needle on lateral  approach. Under sterilne conditions, 18 gauge needle used via lateral approach to aspirate 30 cc of clear, yellow fluid. Then 8 cc of Lidocaine 1% and Depo-Medrol 80 mg injected. Tolerated well, decreased pain, no complications.   Follow-up: Return in about 4 weeks (around 01/28/2015).  Signed,  William GaleaSpencer T. Iviona Hole, MD   Patient's Medications  New Prescriptions   No medications on file  Previous Medications   DICLOFENAC (VOLTAREN) 75 MG EC TABLET    TAKE ONE TABLET BY MOUTH TWICE DAILY   GLUCOSE BLOOD TEST STRIP    Use to check blood sugar two times a day.   HYDROCODONE-ACETAMINOPHEN (NORCO/VICODIN) 5-325 MG PER TABLET    Take 1 tablet by mouth every 12 (twelve) hours as needed for moderate pain.   INSULIN NPH-REGULAR HUMAN (NOVOLIN 70/30 RELION) (70-30) 100 UNIT/ML INJECTION    80 units in the morning and 75 units at night.   LANCETS MISC    Use to check blood sugar two times a day   PIOGLITAZONE (ACTOS) 30 MG TABLET    Take 1 tablet (30 mg total) by mouth daily.   RED YEAST RICE 600 MG CAPS    Take 1 capsule by mouth 2 (two) times daily.   SERTRALINE (ZOLOFT) 50 MG TABLET    Take 1 tablet (50 mg total) by mouth daily.   TRIAMTERENE-HYDROCHLOROTHIAZIDE (DYAZIDE) 50-25 MG PER CAPSULE    TAKE ONE CAPSULE BY MOUTH IN THE MORNING  Modified Medications   No medications on file  Discontinued Medications   No medications on file

## 2015-01-07 ENCOUNTER — Other Ambulatory Visit: Payer: Self-pay | Admitting: *Deleted

## 2015-01-07 DIAGNOSIS — E1165 Type 2 diabetes mellitus with hyperglycemia: Principal | ICD-10-CM

## 2015-01-07 DIAGNOSIS — E11319 Type 2 diabetes mellitus with unspecified diabetic retinopathy without macular edema: Secondary | ICD-10-CM

## 2015-01-07 DIAGNOSIS — IMO0002 Reserved for concepts with insufficient information to code with codable children: Secondary | ICD-10-CM

## 2015-01-07 MED ORDER — GLUCOSE BLOOD VI STRP
ORAL_STRIP | Status: DC
Start: 2015-01-07 — End: 2015-01-08

## 2015-01-08 ENCOUNTER — Other Ambulatory Visit: Payer: Self-pay | Admitting: *Deleted

## 2015-01-08 MED ORDER — GLUCOSE BLOOD VI STRP: ORAL_STRIP | Status: DC

## 2015-01-08 MED ORDER — ACCU-CHEK NANO SMARTVIEW W/DEVICE KIT: PACK | Status: DC

## 2015-01-08 NOTE — Telephone Encounter (Signed)
Mr Gilkison notified. °

## 2015-01-08 NOTE — Telephone Encounter (Signed)
[facility] clever choice meter,  that patient told me he had, can not be ordered.  Got on [employer] Website that states they cover Accu-Chek meters and test strips.  Rx for Accu-Chek Nano Meter and test strips [facility]  Hopefully patient can receive a new meter and test strips without any cost to him.

## 2015-01-12 DIAGNOSIS — H3581 Retinal edema: Secondary | ICD-10-CM | POA: Diagnosis not present

## 2015-01-12 DIAGNOSIS — H3582 Retinal ischemia: Secondary | ICD-10-CM | POA: Diagnosis not present

## 2015-01-12 DIAGNOSIS — E11339 Type 2 diabetes mellitus with moderate nonproliferative diabetic retinopathy without macular edema: Secondary | ICD-10-CM | POA: Diagnosis not present

## 2015-01-12 DIAGNOSIS — H34813 Central retinal vein occlusion, bilateral: Secondary | ICD-10-CM | POA: Diagnosis not present

## 2015-01-20 ENCOUNTER — Ambulatory Visit: Payer: Commercial Managed Care - HMO | Admitting: Family Medicine

## 2015-02-02 ENCOUNTER — Other Ambulatory Visit: Payer: Self-pay | Admitting: Family Medicine

## 2015-02-02 NOTE — Telephone Encounter (Signed)
Received refill request electronically from pharmacy. Last refill 12/12/14 #60, last office visit 12/31/14. Is it okay to refill medication?

## 2015-02-11 ENCOUNTER — Encounter: Payer: Self-pay | Admitting: Family Medicine

## 2015-02-11 ENCOUNTER — Ambulatory Visit (INDEPENDENT_AMBULATORY_CARE_PROVIDER_SITE_OTHER): Payer: Commercial Managed Care - HMO | Admitting: Family Medicine

## 2015-02-11 VITALS — BP 130/60 | HR 79 | Temp 98.0°F | Ht 67.0 in | Wt 308.5 lb

## 2015-02-11 DIAGNOSIS — M2391 Unspecified internal derangement of right knee: Secondary | ICD-10-CM | POA: Diagnosis not present

## 2015-02-11 DIAGNOSIS — IMO0002 Reserved for concepts with insufficient information to code with codable children: Secondary | ICD-10-CM

## 2015-02-11 DIAGNOSIS — M25561 Pain in right knee: Secondary | ICD-10-CM

## 2015-02-11 DIAGNOSIS — E1165 Type 2 diabetes mellitus with hyperglycemia: Secondary | ICD-10-CM | POA: Diagnosis not present

## 2015-02-11 DIAGNOSIS — E11319 Type 2 diabetes mellitus with unspecified diabetic retinopathy without macular edema: Secondary | ICD-10-CM | POA: Diagnosis not present

## 2015-02-11 MED ORDER — GLUCOSE BLOOD VI STRP: ORAL_STRIP | Status: DC

## 2015-02-11 MED ORDER — LANCETS MISC: Status: DC

## 2015-02-11 MED ORDER — METHYLPREDNISOLONE ACETATE 40 MG/ML IJ SUSP
80.0000 mg | Freq: Once | INTRAMUSCULAR | Status: AC
  Administered 2015-02-11: 80 mg via INTRA_ARTICULAR

## 2015-02-11 MED ORDER — HYDROCODONE-ACETAMINOPHEN 5-325 MG PO TABS: 1.0000 | ORAL_TABLET | Freq: Two times a day (BID) | ORAL | Status: DC | PRN

## 2015-02-11 MED ORDER — ONETOUCH DELICA LANCETS 33G MISC: Status: DC

## 2015-02-11 NOTE — Progress Notes (Signed)
Dr. Frederico Hamman T. Michelena Culmer, MD, [facility] [place], [address] Phone: [phone] Fax: [phone]  02/11/2015  Patient: [name], MRN: [id number], DOB: 10-04-1950, 64 y.o.  Primary Physician:  [name], MD  Chief Complaint: Joint Swelling  Subjective:   [name] is a 64 y.o. very pleasant male patient who presents with the following:  R knee pain: Got better for a while, now swollen a lot and still hurting.   History get better quite a bit for some time after we aspirated and injecting his knee, but he is still having pain with basic day-to-day activities and pain with with movement. He is limping and having pain when going up and downstairs. He has not had any additional trauma or injury.  12/31/2014 Last OV with Owens Loffler, MD  Consulting MD: Dr. Diona Browner Reason: R knee pain  5-6 months ago, he was coming out of barn, and knee gave out on it and stepped down on it. Could not put any pressure on it, and it has been that way off and on. Hurts 95% of the time. Today, the patient's symptoms are actually a little bit better, but generally he is very impaired in his day-to-day life.  He has not had any locking up of the joint, but he does primarily have medial knee pain.  He has had some intermittent swelling.  He occasionally feels like it might give way.  Never had old knee injuries.  Doing some lawn service.   Past Medical History, Surgical History, Social History, Family History, Problem List, Medications, and Allergies have been reviewed and updated if relevant.  GEN: No fevers, chills. Nontoxic. Primarily MSK c/o today. MSK: Detailed in the HPI GI: tolerating PO intake without difficulty Neuro: No numbness, parasthesias, or tingling associated. Otherwise the pertinent positives of the ROS are noted above.   Objective:   BP 130/60 mmHg  Pulse 79  Temp(Src) 98 F (36.7 C) (Oral)  Ht 5' 7"  (1.702 m)   Wt 308 lb 8 oz (139.935 kg)  BMI 48.31 kg/m2   GEN: WDWN, NAD, Non-toxic, Alert & Oriented x 3 HEENT: Atraumatic, Normocephalic.  Ears and Nose: No external deformity. EXTR: No clubbing/cyanosis/edema NEURO: Normal gait, mild antalgia PSYCH: Normally interactive. Conversant. Not depressed or anxious appearing.  Calm demeanor.    Right knee: The patient lacks 4 of extension.  Flexion to 110.  He does have some tenderness on the medial joint line.  Lateral joint line is nontender.  Stable MCL and LCL.  Lachman is negative.  Anterior and posterior drawer testing is negative.  Other special testing is equivocal given the patient's effusion.  He does have a moderate sized effusion.  Radiology: EXAM: RIGHT KNEE - COMPLETE 4+ VIEW  COMPARISON: None.  FINDINGS: Mild prepatellar soft tissue swelling is noted. Lucency noted along the superior lateral aspect of the patella most likely secondary to a bipartite patella as opposed to patellar fracture. Tiny bony density noted in the infrapatellar region, this could be a tiny loose body. No other focal abnormality identified.  IMPRESSION: 1. 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 2. Prepatellar soft tissue swelling. 3. Tiny bony density noted inferior to patella. Tiny loose body cannot be excluded.   Electronically Signed  By: Marcello Moores Register  On: 09/23/2014 09:43  Assessment and Plan:   Right knee pain - Plan: methylPREDNISolone acetate (DEPO-MEDROL) injection 80 mg  Internal derangement of right knee  Uncontrolled diabetes mellitus with retinopathy - Plan: DISCONTINUED: Lancets MISC  We discussed different ways of treating this. I suspect he probably has internal derangement, more likely a meniscal tear. At this point, given his initial failure of conservative management recommended obtaining an MRI of the right knee to help [place] decide definitive management.  Consideration of potential intervention if indicated. He is limited somewhat from a financial standpoint, so he wanted to do anything conservative that we could, and felt like operation was out of the question for him financially.  Therefore we are going to aspirate his knee again and see if we can get into comfort for the summer.  Knee Aspiration and Injection, R Patient verbally consented; risks, benefits, and alternatives explained including possible infection. Patient prepped with Chloraprep. Ethyl chloride for anesthesia. 10 cc of 1% Lidocaine used in wheal then injected Subcutaneous fashion with 22 gauge needle on lateral approach. Under sterilne conditions, 18 gauge needle used via lateral approach to aspirate 15 cc of clear, yellow synovial fluid. Then 8 cc of Lidocaine 1% and Depo-Medrol 80 mg injected. Tolerated well, decreased pain, no complications.   Follow-up: prn  New Prescriptions   ONETOUCH DELICA LANCETS 85U MISC    Use to check blood sugars up to three times a day.  Dx: E11.319 On insulin   No orders of the defined types were placed in this encounter.    Signed,  Maud Deed. Aminta Sakurai, MD   Patient's Medications  New Prescriptions   ONETOUCH DELICA LANCETS 31S MISC    Use to check blood sugars up to three times a day.  Dx: E11.319 On insulin  Previous Medications   DICLOFENAC (VOLTAREN) 75 MG EC TABLET    TAKE ONE TABLET BY MOUTH TWICE DAILY   INSULIN NPH-REGULAR HUMAN (NOVOLIN 70/30 RELION) (70-30) 100 UNIT/ML INJECTION    80 units in the morning and 75 units at night.   PIOGLITAZONE (ACTOS) 30 MG TABLET    Take 1 tablet (30 mg total) by mouth daily.   RED YEAST RICE 600 MG CAPS    Take 1 capsule by mouth 2 (two) times daily.   SERTRALINE (ZOLOFT) 50 MG TABLET    Take 1 tablet (50 mg total) by mouth daily.   TRIAMTERENE-HYDROCHLOROTHIAZIDE (DYAZIDE) 50-25 MG PER CAPSULE    TAKE ONE CAPSULE BY MOUTH IN THE MORNING  Modified Medications   Modified Medication Previous  Medication   GLUCOSE BLOOD (ONETOUCH VERIO) TEST STRIP glucose blood (ONETOUCH VERIO) test strip      Use to check blood sugar up to three times a day.  Dx: E11.319 On Insulin    1 each by Other route as needed for other. Use as instructed   HYDROCODONE-ACETAMINOPHEN (NORCO/VICODIN) 5-325 MG PER TABLET HYDROcodone-acetaminophen (NORCO/VICODIN) 5-325 MG per tablet      Take 1 tablet by mouth every 12 (twelve) hours as needed for moderate pain.    Take 1 tablet by mouth every 12 (twelve) hours as needed for moderate pain.  Discontinued Medications   BLOOD GLUCOSE MONITORING SUPPL (ACCU-CHEK NANO SMARTVIEW) W/DEVICE KIT    Use to check blood sugar two times a day.  Dx: E11.319  Accu Chek Voucher #[id number] for free meter   GLUCOSE BLOOD (ACCU-CHEK SMARTVIEW) TEST STRIP    Use to check blood sugar two times a day.  Dx: E11.319   LANCETS MISC    Use to check blood sugar two times a day

## 2015-02-11 NOTE — Progress Notes (Signed)
Pre visit review using our clinic review tool, if applicable. No additional management support is needed unless otherwise documented below in the visit note. 

## 2015-02-14 DIAGNOSIS — Z23 Encounter for immunization: Secondary | ICD-10-CM | POA: Diagnosis not present

## 2015-02-18 ENCOUNTER — Telehealth: Payer: Self-pay

## 2015-02-18 NOTE — Telephone Encounter (Signed)
Diabetic Bundle. I tired to contact pt. Home phone number would not connect.

## 2015-02-25 ENCOUNTER — Institutional Professional Consult (permissible substitution): Payer: Commercial Managed Care - HMO | Admitting: Pulmonary Disease

## 2015-03-10 ENCOUNTER — Other Ambulatory Visit: Payer: Self-pay | Admitting: Family Medicine

## 2015-03-23 DIAGNOSIS — H34813 Central retinal vein occlusion, bilateral: Secondary | ICD-10-CM | POA: Diagnosis not present

## 2015-03-23 DIAGNOSIS — H3581 Retinal edema: Secondary | ICD-10-CM | POA: Diagnosis not present

## 2015-04-08 ENCOUNTER — Other Ambulatory Visit: Payer: Self-pay | Admitting: Family Medicine

## 2015-04-14 ENCOUNTER — Other Ambulatory Visit: Payer: Self-pay | Admitting: *Deleted

## 2015-04-14 MED ORDER — INSULIN NPH ISOPHANE & REGULAR (70-30) 100 UNIT/ML ~~LOC~~ SUSP: SUBCUTANEOUS | Status: DC

## 2015-05-07 ENCOUNTER — Other Ambulatory Visit: Payer: Self-pay | Admitting: Family Medicine

## 2015-06-08 ENCOUNTER — Other Ambulatory Visit: Payer: Self-pay | Admitting: Family Medicine

## 2015-06-08 NOTE — Telephone Encounter (Signed)
Please call and scheduled CPE with fasting labs for Dr. Ermalene Searing.

## 2015-06-15 DIAGNOSIS — H348112 Central retinal vein occlusion, right eye, stable: Secondary | ICD-10-CM | POA: Diagnosis not present

## 2015-06-15 DIAGNOSIS — H34812 Central retinal vein occlusion, left eye, with macular edema: Secondary | ICD-10-CM | POA: Diagnosis not present

## 2015-06-15 DIAGNOSIS — E113393 Type 2 diabetes mellitus with moderate nonproliferative diabetic retinopathy without macular edema, bilateral: Secondary | ICD-10-CM | POA: Diagnosis not present

## 2015-06-25 ENCOUNTER — Ambulatory Visit (INDEPENDENT_AMBULATORY_CARE_PROVIDER_SITE_OTHER): Payer: Medicare Other

## 2015-06-25 DIAGNOSIS — Z23 Encounter for immunization: Secondary | ICD-10-CM | POA: Diagnosis not present

## 2015-07-03 ENCOUNTER — Emergency Department
Admission: EM | Admit: 2015-07-03 | Discharge: 2015-07-03 | Disposition: A | Discharge status: Home / Self Care | Payer: Medicare Other | Attending: Emergency Medicine | Admitting: Emergency Medicine

## 2015-07-03 ENCOUNTER — Emergency Department: Payer: Medicare Other

## 2015-07-03 ENCOUNTER — Encounter: Payer: Self-pay | Admitting: *Deleted

## 2015-07-03 DIAGNOSIS — Z79899 Other long term (current) drug therapy: Secondary | ICD-10-CM | POA: Insufficient documentation

## 2015-07-03 DIAGNOSIS — Z794 Long term (current) use of insulin: Secondary | ICD-10-CM | POA: Diagnosis not present

## 2015-07-03 DIAGNOSIS — Z7984 Long term (current) use of oral hypoglycemic drugs: Secondary | ICD-10-CM | POA: Insufficient documentation

## 2015-07-03 DIAGNOSIS — I10 Essential (primary) hypertension: Secondary | ICD-10-CM | POA: Diagnosis not present

## 2015-07-03 DIAGNOSIS — M25561 Pain in right knee: Secondary | ICD-10-CM | POA: Diagnosis not present

## 2015-07-03 DIAGNOSIS — M1711 Unilateral primary osteoarthritis, right knee: Secondary | ICD-10-CM

## 2015-07-03 DIAGNOSIS — E11319 Type 2 diabetes mellitus with unspecified diabetic retinopathy without macular edema: Secondary | ICD-10-CM | POA: Diagnosis not present

## 2015-07-03 DIAGNOSIS — M179 Osteoarthritis of knee, unspecified: Secondary | ICD-10-CM | POA: Diagnosis not present

## 2015-07-03 MED ORDER — KETOROLAC TROMETHAMINE 60 MG/2ML IM SOLN
60.0000 mg | Freq: Once | INTRAMUSCULAR | Status: AC
  Administered 2015-07-03: 60 mg via INTRAMUSCULAR
  Filled 2015-07-03: qty 2

## 2015-07-03 MED ORDER — OXYCODONE-ACETAMINOPHEN 5-325 MG PO TABS: 1.0000 | ORAL_TABLET | ORAL | Status: DC | PRN

## 2015-07-03 MED ORDER — ETODOLAC 400 MG PO TABS: 400.0000 mg | ORAL_TABLET | Freq: Two times a day (BID) | ORAL | Status: DC

## 2015-07-03 NOTE — Discharge Instructions (Signed)

## 2015-07-03 NOTE — ED Notes (Signed)
Pt has chronic right knee pain, has seen PCP several times, pt reports pain increased last night

## 2015-07-03 NOTE — ED Provider Notes (Signed)
[facility] Emergency Department Provider Note  ____________________________________________  Time seen: Approximately 1:26 PM  I have reviewed the triage vital signs and the nursing notes.   HISTORY  Chief Complaint Knee Pain   HPI [name] is a 64 y.o. male is for evaluation of right knee pain times one year. Patient states history of the same currently no relief with diclofenac and hydrocodone.This pain is increased when trying to bear weight or walk.   Past Medical History  Diagnosis Date  . CAD (coronary artery disease)     mild  . Diabetes mellitus type 2, insulin dependent (HCC)   . Hyperlipemia   . Hypertension   . Osteoarthritis   . Depression   . Kidney stones   . Obesity   . Back injury 02/2002    worker's comp    Patient Active Problem List   Diagnosis Date Noted  . Right medial knee pain 09/23/2014  . Diabetic retinopathy (HCC) 09/23/2014  . Snoring 12/21/2012  . HYPOGONADISM 09/28/2010  . VITAMIN B12 DEFICIENCY 09/28/2010  . UNSPECIFIED VITAMIN D DEFICIENCY 09/28/2010  . OTHER MALAISE AND FATIGUE 09/17/2010  . TRIGGER FINGER, RIGHT MIDDLE 09/14/2010  . BRANCH RETINAL VEIN OCCLUSION 11/26/2009  . LEG PAIN, BILATERAL 10/06/2009  . CONSTIPATION 08/10/2009  . GASTRITIS 07/29/2009  . Major depressive disorder, recurrent episode, moderate (HCC) 07/06/2007  . ERECTILE DYSFUNCTION 04/10/2007  . Uncontrolled diabetes mellitus with retinopathy (HCC) 03/26/2007  . OBESITY 03/26/2007  . CORONARY ARTERY DISEASE 03/26/2007  . RENAL CALCULUS, HX OF 03/26/2007  . Hyperlipidemia LDL goal <70 12/05/2006  . Essential hypertension, benign 12/05/2006  . OSTEOARTHRITIS 12/05/2006    Past Surgical History  Procedure Laterality Date  . Cardiac catheterization  09/2000    diffuse LAD 30% LCA  EF 50-60%  . Cardiac catheterization  06/2007    no significant CAD    Current Outpatient Rx  Name  Route  Sig  Dispense  Refill  . etodolac  (LODINE) 400 MG tablet   Oral   Take 1 tablet (400 mg total) by mouth 2 (two) times daily.   60 tablet   0   . glucose blood (ONETOUCH VERIO) test strip      Use to check blood sugar up to three times a day.  Dx: E11.319 On Insulin   100 each   11   . insulin NPH-regular Human (NOVOLIN 70/30 RELION) (70-30) 100 UNIT/ML injection      80 units in the morning and 75 units at night.   50 mL   5   . ONETOUCH DELICA LANCETS 33G MISC      Use to check blood sugars up to three times a day.  Dx: E11.319 On insulin   100 each   11   . oxyCODONE-acetaminophen (ROXICET) 5-325 MG tablet   Oral   Take 1-2 tablets by mouth every 4 (four) hours as needed for severe pain.   15 tablet   0   . pioglitazone (ACTOS) 30 MG tablet   Oral   Take 1 tablet (30 mg total) by mouth daily.   30 tablet   11   . Red Yeast Rice 600 MG CAPS   Oral   Take 1 capsule by mouth 2 (two) times daily.         . sertraline (ZOLOFT) 50 MG tablet   Oral   Take 1 tablet (50 mg total) by mouth daily.   30 tablet   3   .  triamterene-hydrochlorothiazide (DYAZIDE) 50-25 MG capsule      TAKE ONE CAPSULE BY MOUTH IN THE MORNING   30 capsule   2     Allergies Atorvastatin  Family History  Problem Relation Age of Onset  . Alzheimer's disease Mother   . Emphysema Mother   . Diabetes Father   . Heart disease Father     MI  . Cancer Brother     ? Neck cancer    Social History Social History  Substance Use Topics  . Smoking status: Never Smoker   . Smokeless tobacco: Never Used  . Alcohol Use: No    Review of Systems Constitutional: No fever/chills Eyes: No visual changes. ENT: No sore throat. Cardiovascular: Denies chest pain. Respiratory: Denies shortness of breath. Gastrointestinal: No abdominal pain.  No nausea, no vomiting.  No diarrhea.  No constipation. Genitourinary: Negative for dysuria. Musculoskeletal: Positive for right knee pain. Skin: Negative for rash. Neurological:  Negative for headaches, focal weakness or numbness.  10-point ROS otherwise negative.  ____________________________________________   PHYSICAL EXAM:  VITAL SIGNS: ED Triage Vitals  Enc Vitals Group     BP 07/03/15 1212 189/72 mmHg     Pulse Rate 07/03/15 1212 80     Resp 07/03/15 1212 22     Temp 07/03/15 1212 98.1 F (36.7 C)     Temp Source 07/03/15 1212 Oral     SpO2 07/03/15 1212 97 %     Weight 07/03/15 1212 305 lb (138.347 kg)     Height 07/03/15 1212  (1.676 m)     Head Cir --      Peak Flow --      Pain Score 07/03/15 1213 2     Pain Loc --      Pain Edu? --      Excl. in GC? --     Constitutional: Alert and oriented. Well appearing and in no acute distress. Eyes: Conjunctivae are normal. PERRL. EOMI. Head: Atraumatic. Nose: No congestion/rhinnorhea. Mouth/Throat: Mucous membranes are moist.  Oropharynx non-erythematous. Neck: No stridor.   Cardiovascular: Normal rate, regular rhythm. Grossly normal heart sounds.  Good peripheral circulation. Respiratory: Normal respiratory effort.  No retractions. Lungs CTAB. Gastrointestinal: Soft and nontender. No distention. No abdominal bruits. No CVA tenderness. Musculoskeletal: Right knee tenderness with increased pain with flexion. Minimal edema noted. Neurologic:  Normal speech and language. No gross focal neurologic deficits are appreciated. No gait instability. Skin:  Skin is warm, dry and intact. No rash noted. Psychiatric: Mood and affect are normal. Speech and behavior are normal.  ____________________________________________   LABS (all labs ordered are listed, but only abnormal results are displayed)  Labs Reviewed - No data to display ____________________________________________    RADIOLOGY    IMPRESSION: Mild-to-moderate medial compartment and mild patellofemoral compartment osteoarthritis in the right knee. No right knee  joint effusion. ____________________________________________   PROCEDURES  Procedure(s) performed: None  Critical Care performed: No  ____________________________________________   INITIAL IMPRESSION / ASSESSMENT AND PLAN / ED COURSE  Pertinent labs & imaging results that were available during my care of the patient were reviewed by me and considered in my medical decision making (see chart for details).  Severe tricompartmental osteoarthritis of the right knee. Rx given for lodine 400 mg twice a day, Percocet 5/325 4 times a day when necessary. Patient to follow up with PCP for MRI or continue treatment. Patient voices no other emergency medical complaints at this time. ____________________________________________   FINAL CLINICAL IMPRESSION(S) /  ED DIAGNOSES  Final diagnoses:  Osteoarthritis of right knee, unspecified osteoarthritis type      Evangeline Dakin, PA-C 07/03/15 1451  Phineas Semen, MD 07/03/15 [phone]

## 2015-07-03 NOTE — ED Notes (Signed)
Says rigth knee pain this am.  History of injry to same knee about a year ago.

## 2015-07-31 ENCOUNTER — Ambulatory Visit: Payer: Medicare Other | Admitting: Family Medicine

## 2015-08-04 ENCOUNTER — Telehealth: Payer: Self-pay | Admitting: Family Medicine

## 2015-08-04 ENCOUNTER — Ambulatory Visit: Payer: Medicare Other | Admitting: Family Medicine

## 2015-08-04 NOTE — Telephone Encounter (Signed)
Pt's wife called at 8:36am to cancel appt, he was called into work. Does the appt need to be rescheduled and would you like to keep it on as a no show?

## 2015-08-04 NOTE — Telephone Encounter (Signed)
Please reschedule.Marland Kitchen. No change for pt.

## 2015-08-09 ENCOUNTER — Other Ambulatory Visit: Payer: Self-pay | Admitting: Family Medicine

## 2015-08-10 NOTE — Telephone Encounter (Signed)
Left a message with pt's wife to call back and reschedule appt.

## 2015-08-10 NOTE — Telephone Encounter (Signed)
Last office visit 02/11/2015 with Dr. Patsy Lageropland.  Not on current medication list.  Refill?

## 2015-09-02 LAB — HM DIABETES EYE EXAM

## 2015-09-07 ENCOUNTER — Other Ambulatory Visit: Payer: Self-pay | Admitting: Family Medicine

## 2015-09-08 NOTE — Telephone Encounter (Signed)
Last office visit 02/12/2015 with Dr. Patsy Lageropland.  Last office visit with Dr. Ermalene SearingBedsole 12/23/2014 states to follow up in 4 weeks for multiple issues.  No future appointments are scheduled.  Refill?

## 2015-09-11 ENCOUNTER — Other Ambulatory Visit: Payer: Self-pay

## 2015-09-11 MED ORDER — ETODOLAC 400 MG PO TABS: 400.0000 mg | ORAL_TABLET | Freq: Two times a day (BID) | ORAL | Status: DC

## 2015-09-11 NOTE — Telephone Encounter (Signed)
Ms [name] said that diclofenac is not helping pain and request etodolac to [facility] [address]. Advised should not take diclofenac and etodolac at same time. Ms [name] voiced understanding. ED PA prescribed the etodolac.

## 2015-09-21 DIAGNOSIS — E113393 Type 2 diabetes mellitus with moderate nonproliferative diabetic retinopathy without macular edema, bilateral: Secondary | ICD-10-CM | POA: Diagnosis not present

## 2015-09-21 DIAGNOSIS — H348112 Central retinal vein occlusion, right eye, stable: Secondary | ICD-10-CM | POA: Diagnosis not present

## 2015-09-21 DIAGNOSIS — H3582 Retinal ischemia: Secondary | ICD-10-CM | POA: Diagnosis not present

## 2015-09-21 DIAGNOSIS — H34812 Central retinal vein occlusion, left eye, with macular edema: Secondary | ICD-10-CM | POA: Diagnosis not present

## 2015-09-28 ENCOUNTER — Encounter: Payer: Self-pay | Admitting: Emergency Medicine

## 2015-09-28 ENCOUNTER — Emergency Department: Payer: PPO

## 2015-09-28 ENCOUNTER — Emergency Department
Admission: EM | Admit: 2015-09-28 | Discharge: 2015-09-28 | Disposition: A | Discharge status: Home / Self Care | Payer: PPO | Attending: Emergency Medicine | Admitting: Emergency Medicine

## 2015-09-28 DIAGNOSIS — Z7984 Long term (current) use of oral hypoglycemic drugs: Secondary | ICD-10-CM | POA: Diagnosis not present

## 2015-09-28 DIAGNOSIS — E11319 Type 2 diabetes mellitus with unspecified diabetic retinopathy without macular edema: Secondary | ICD-10-CM | POA: Diagnosis not present

## 2015-09-28 DIAGNOSIS — F419 Anxiety disorder, unspecified: Secondary | ICD-10-CM | POA: Diagnosis not present

## 2015-09-28 DIAGNOSIS — Z79899 Other long term (current) drug therapy: Secondary | ICD-10-CM | POA: Insufficient documentation

## 2015-09-28 DIAGNOSIS — R109 Unspecified abdominal pain: Secondary | ICD-10-CM

## 2015-09-28 DIAGNOSIS — Z791 Long term (current) use of non-steroidal anti-inflammatories (NSAID): Secondary | ICD-10-CM | POA: Diagnosis not present

## 2015-09-28 DIAGNOSIS — M545 Low back pain, unspecified: Secondary | ICD-10-CM

## 2015-09-28 DIAGNOSIS — Z794 Long term (current) use of insulin: Secondary | ICD-10-CM | POA: Diagnosis not present

## 2015-09-28 DIAGNOSIS — I10 Essential (primary) hypertension: Secondary | ICD-10-CM | POA: Insufficient documentation

## 2015-09-28 LAB — BASIC METABOLIC PANEL
Anion gap: 7 (ref 5–15)
BUN: 20 mg/dL (ref 6–20)
CO2: 26 mmol/L (ref 22–32)
Calcium: 9.1 mg/dL (ref 8.9–10.3)
Chloride: 104 mmol/L (ref 101–111)
Creatinine, Ser: 0.99 mg/dL (ref 0.61–1.24)
GFR calc Af Amer: >60 mL/min (ref >60)
GFR calc non Af Amer: >60 mL/min (ref >60)
Glucose, Bld: 338 mg/dL — ABNORMAL HIGH (ref 65–99)
Potassium: 4.1 mmol/L (ref 3.5–5.1)
Sodium: 137 mmol/L (ref 135–145)

## 2015-09-28 LAB — URINALYSIS COMPLETE WITH MICROSCOPIC (ARMC ONLY)
Bilirubin Urine: NEGATIVE
Glucose, UA: >500 mg/dL — AB
Hgb urine dipstick: NEGATIVE
Ketones, ur: NEGATIVE mg/dL
Leukocytes, UA: NEGATIVE
Nitrite: NEGATIVE
Protein, ur: NEGATIVE mg/dL
Specific Gravity, Urine: 1.025 (ref 1.005–1.030)
Squamous Epithelial / LPF: NONE SEEN
WBC, UA: NONE SEEN WBC/hpf (ref 0–5)
pH: 6 (ref 5.0–8.0)

## 2015-09-28 LAB — CBC
HCT: 41.9 % (ref 40.0–52.0)
Hemoglobin: 14 g/dL (ref 13.0–18.0)
MCH: 29.8 pg (ref 26.0–34.0)
MCHC: 33.4 g/dL (ref 32.0–36.0)
MCV: 89 fL (ref 80.0–100.0)
Platelets: 211 10*3/uL (ref 150–440)
RBC: 4.7 MIL/uL (ref 4.40–5.90)
RDW: 14 % (ref 11.5–14.5)
WBC: 6.6 10*3/uL (ref 3.8–10.6)

## 2015-09-28 MED ORDER — KETOROLAC TROMETHAMINE 30 MG/ML IJ SOLN
30.0000 mg | Freq: Once | INTRAMUSCULAR | Status: AC
  Administered 2015-09-28: 30 mg via INTRAMUSCULAR
  Filled 2015-09-28: qty 1

## 2015-09-28 MED ORDER — CYCLOBENZAPRINE HCL 10 MG PO TABS: 10.0000 mg | ORAL_TABLET | Freq: Three times a day (TID) | ORAL | Status: DC | PRN

## 2015-09-28 MED ORDER — OXYCODONE-ACETAMINOPHEN 5-325 MG PO TABS
1.0000 | ORAL_TABLET | Freq: Once | ORAL | Status: AC
  Administered 2015-09-28: 1 via ORAL
  Filled 2015-09-28: qty 1

## 2015-09-28 NOTE — ED Notes (Signed)
MD Kinner at bedside  

## 2015-09-28 NOTE — ED Notes (Signed)
Patient did not drive, states his granddaughter brought him here

## 2015-09-28 NOTE — ED Notes (Signed)
Patient transported to CT 

## 2015-09-28 NOTE — ED Notes (Signed)
MD at bedside. 

## 2015-09-28 NOTE — ED Provider Notes (Signed)
[facility] Emergency Department Provider Note  ____________________________________________    I have reviewed the triage vital signs and the nursing notes.   HISTORY  Chief Complaint Flank Pain  back pain   HPI [name] is a 65 y.o. male who presents with left sided low back pain and left flank pain which is sharp in nature and moderate to severe. He feels this pain may be related to a kidney stone although it gets worse with movement and flexion of his back. He denies injury to the area. No trauma. No fevers no chills. No dysuria. No hematuria. No recent travel. No focal deficits. No abdominal pain     Past Medical History  Diagnosis Date  . CAD (coronary artery disease)     mild  . Diabetes mellitus type 2, insulin dependent (HCC)   . Hyperlipemia   . Hypertension   . Osteoarthritis   . Depression   . Kidney stones   . Obesity   . Back injury 02/2002    worker's comp    Patient Active Problem List   Diagnosis Date Noted  . Right medial knee pain 09/23/2014  . Diabetic retinopathy (HCC) 09/23/2014  . Snoring 12/21/2012  . HYPOGONADISM 09/28/2010  . VITAMIN B12 DEFICIENCY 09/28/2010  . UNSPECIFIED VITAMIN D DEFICIENCY 09/28/2010  . OTHER MALAISE AND FATIGUE 09/17/2010  . TRIGGER FINGER, RIGHT MIDDLE 09/14/2010  . BRANCH RETINAL VEIN OCCLUSION 11/26/2009  . LEG PAIN, BILATERAL 10/06/2009  . CONSTIPATION 08/10/2009  . GASTRITIS 07/29/2009  . Major depressive disorder, recurrent episode, moderate (HCC) 07/06/2007  . ERECTILE DYSFUNCTION 04/10/2007  . Uncontrolled diabetes mellitus with retinopathy (HCC) 03/26/2007  . OBESITY 03/26/2007  . CORONARY ARTERY DISEASE 03/26/2007  . RENAL CALCULUS, HX OF 03/26/2007  . Hyperlipidemia LDL goal <70 12/05/2006  . Essential hypertension, benign 12/05/2006  . OSTEOARTHRITIS 12/05/2006    Past Surgical History  Procedure Laterality Date  . Cardiac catheterization  09/2000    diffuse LAD 30%  LCA  EF 50-60%  . Cardiac catheterization  06/2007    no significant CAD    Current Outpatient Rx  Name  Route  Sig  Dispense  Refill  . cyclobenzaprine (FLEXERIL) 10 MG tablet   Oral   Take 1 tablet (10 mg total) by mouth every 8 (eight) hours as needed for muscle spasms.   20 tablet   1   . diclofenac (VOLTAREN) 75 MG EC tablet      TAKE ONE TABLET BY MOUTH TWICE DAILY   60 tablet   0   . etodolac (LODINE) 400 MG tablet   Oral   Take 1 tablet (400 mg total) by mouth 2 (two) times daily.   60 tablet   0   . glucose blood (ONETOUCH VERIO) test strip      Use to check blood sugar up to three times a day.  Dx: E11.319 On Insulin   100 each   11   . insulin NPH-regular Human (NOVOLIN 70/30 RELION) (70-30) 100 UNIT/ML injection      80 units in the morning and 75 units at night.   50 mL   5   . ONETOUCH DELICA LANCETS 33G MISC      Use to check blood sugars up to three times a day.  Dx: E11.319 On insulin   100 each   11   . oxyCODONE-acetaminophen (ROXICET) 5-325 MG tablet   Oral   Take 1-2 tablets by mouth every 4 (four) hours  as needed for severe pain.   15 tablet   0   . pioglitazone (ACTOS) 30 MG tablet   Oral   Take 1 tablet (30 mg total) by mouth daily.   30 tablet   11   . Red Yeast Rice 600 MG CAPS   Oral   Take 1 capsule by mouth 2 (two) times daily.         . sertraline (ZOLOFT) 50 MG tablet   Oral   Take 1 tablet (50 mg total) by mouth daily.   30 tablet   3   . triamterene-hydrochlorothiazide (DYAZIDE) 50-25 MG capsule      TAKE ONE CAPSULE BY MOUTH IN THE MORNING   30 capsule   0     Allergies Atorvastatin  Family History  Problem Relation Age of Onset  . Alzheimer's disease Mother   . Emphysema Mother   . Diabetes Father   . Heart disease Father     MI  . Cancer Brother     ? Neck cancer    Social History Social History  Substance Use Topics  . Smoking status: Never Smoker   . Smokeless tobacco: Never Used  .  Alcohol Use: No    Review of Systems  Constitutional: Negative for fever. Eyes: Negative for visual changes. ENT: Negative for sore throat Cardiovascular: Negative for chest pain. Respiratory: Negative for shortness of breath. Gastrointestinal: Negative for  vomiting and diarrhea. Genitourinary: Negative for dysuria. Musculoskeletal: Positive for back pain Skin: Negative for rash. Neurological: Negative for headaches or focal weakness Psychiatric: Mild anxiety    ____________________________________________   PHYSICAL EXAM:  VITAL SIGNS: ED Triage Vitals  Enc Vitals Group     BP 09/28/15 1245 154/68 mmHg     Pulse Rate 09/28/15 1244 84     Resp 09/28/15 1244 18     Temp 09/28/15 1244 98.1 F (36.7 C)     Temp Source 09/28/15 1244 Oral     SpO2 09/28/15 1244 98 %     Weight 09/28/15 1231 303 lb (137.44 kg)     Height 09/28/15 1231  (1.676 m)     Head Cir --      Peak Flow --      Pain Score 09/28/15 1232 8     Pain Loc --      Pain Edu? --      Excl. in GC? --      Constitutional: Alert and oriented. Well appearing and in no distress. Eyes: Conjunctivae are normal.  ENT   Head: Normocephalic and atraumatic.   Mouth/Throat: Mucous membranes are moist. Cardiovascular: Normal rate, regular rhythm. Normal and symmetric distal pulses are present in all extremities.  Respiratory: Normal respiratory effort without tachypnea nor retractions. Breath sounds are clear and equal bilaterally.  Gastrointestinal: Soft and non-tender in all quadrants. No distention. There is no CVA tenderness. Genitourinary: deferred Musculoskeletal: Nontender with normal range of motion in all extremities. Patient with moderate lumbar left paraspinal tenderness to palpation suspicious for muscle spasm Neurologic:  Normal speech and language. No gross focal neurologic deficits are appreciated. Skin:  Skin is warm, dry and intact. No rash noted. Psychiatric: Mood and affect are  normal. Patient exhibits appropriate insight and judgment.  ____________________________________________    LABS (pertinent positives/negatives)  Labs Reviewed  URINALYSIS COMPLETEWITH MICROSCOPIC ([facility] ONLY) - Abnormal; Notable for the following:    Color, Urine YELLOW (*)    APPearance CLEAR (*)    Glucose, UA >500 (*)  Bacteria, UA RARE (*)    All other components within normal limits  BASIC METABOLIC PANEL - Abnormal; Notable for the following:    Glucose, Bld 338 (*)    All other components within normal limits  CBC    ____________________________________________   EKG  None  ____________________________________________    RADIOLOGY I have personally reviewed any xrays that were ordered on this patient: CT abdomen pelvis unremarkable  ____________________________________________   PROCEDURES  Procedure(s) performed: none  Critical Care performed: none  ____________________________________________   INITIAL IMPRESSION / ASSESSMENT AND PLAN / ED COURSE  Pertinent labs & imaging results that were available during my care of the patient were reviewed by me and considered in my medical decision making (see chart for details).  Patient overall well-appearing. I suspect his pain is related to back pain as opposed to a kidney stone however he is concerned so we will obtain CT renal stone study. His lab work is unremarkable. His urine is unremarkable  CT renal stone study is normal. Toradol 30 mg IM given  I suspect this is muscular skeletal back pain. His abdomen is nontender. His labs are normal. His vitals are normal. Discharged him with a muscle relaxer for suspected muscle spasm  ____________________________________________   FINAL CLINICAL IMPRESSION(S) / ED DIAGNOSES  Final diagnoses:  Flank pain, acute  Bilateral low back pain without sciatica     Jene Every, MD 09/28/15 2156

## 2015-09-28 NOTE — ED Notes (Signed)
Pt here with c/o intermittent left lower back pain since Sat, states it hurts worse with movement, denies burning with urination, denies injury to area, denies hematuria. Pt has had kidney stones in the past, is unsure if this pain is the same.

## 2015-09-28 NOTE — Discharge Instructions (Signed)

## 2015-10-08 ENCOUNTER — Telehealth: Payer: Self-pay | Admitting: Family Medicine

## 2015-10-08 NOTE — Telephone Encounter (Signed)
Pt needs appt for DM and labs prior, can refill until appt once made.

## 2015-10-08 NOTE — Telephone Encounter (Signed)
Last office visit 02/12/2015 with Dr. Patsy Lager. Last office visit with Dr. Ermalene Searing 12/23/2014 states to follow up in 4 weeks for multiple issues. No future appointments are scheduled. Refill?

## 2015-10-09 MED ORDER — TRIAMTERENE-HCTZ 50-25 MG PO CAPS: 1.0000 | ORAL_CAPSULE | Freq: Every morning | ORAL | Status: DC

## 2015-10-09 MED ORDER — DICLOFENAC SODIUM 75 MG PO TBEC: 75.0000 mg | DELAYED_RELEASE_TABLET | Freq: Two times a day (BID) | ORAL | Status: DC

## 2015-10-09 NOTE — Addendum Note (Signed)
Addended by: Damita Lack on: 10/09/2015 03:29 PM   Modules accepted: Orders

## 2015-10-09 NOTE — Telephone Encounter (Signed)
2/13 labs 2/17 cpx Pt aware

## 2015-10-09 NOTE — Addendum Note (Signed)
Addended by: LORING, DONNA S on: 10/09/2015 03:29 PM   Modules accepted: Orders  

## 2015-10-09 NOTE — Telephone Encounter (Signed)
Please call and schedule Diabetes follow up with labs prior for Dr. Ermalene Searing. She will not refill his medicine until he has scheduled an appointment.

## 2015-10-12 ENCOUNTER — Other Ambulatory Visit: Payer: PPO

## 2015-10-13 ENCOUNTER — Other Ambulatory Visit: Payer: Self-pay | Admitting: *Deleted

## 2015-10-13 MED ORDER — TRIAMTERENE-HCTZ 37.5-25 MG PO CAPS: 1.0000 | ORAL_CAPSULE | Freq: Every day | ORAL | Status: DC

## 2015-10-13 NOTE — Telephone Encounter (Signed)
Received fax from [facility] stating Triam/HCTZ 50-25 mg is currently on backorder.  Asking if Dr. Ermalene Searing would like to use 37.5-2.5 mg instead.  Ok per Dr. Ermalene Searing to change to 37.5-25 mg.  New Rx sent into [facility] [address]  Medication list updated.

## 2015-10-16 ENCOUNTER — Encounter: Payer: Self-pay | Admitting: Family Medicine

## 2015-10-16 ENCOUNTER — Ambulatory Visit (INDEPENDENT_AMBULATORY_CARE_PROVIDER_SITE_OTHER): Payer: PPO | Admitting: Family Medicine

## 2015-10-16 VITALS — BP 140/72 | HR 83 | Temp 98.2°F | Ht 67.0 in | Wt 305.8 lb

## 2015-10-16 DIAGNOSIS — E785 Hyperlipidemia, unspecified: Secondary | ICD-10-CM | POA: Diagnosis not present

## 2015-10-16 DIAGNOSIS — Z1159 Encounter for screening for other viral diseases: Secondary | ICD-10-CM

## 2015-10-16 DIAGNOSIS — Z9119 Patient's noncompliance with other medical treatment and regimen: Secondary | ICD-10-CM

## 2015-10-16 DIAGNOSIS — E113399 Type 2 diabetes mellitus with moderate nonproliferative diabetic retinopathy without macular edema, unspecified eye: Secondary | ICD-10-CM

## 2015-10-16 DIAGNOSIS — I10 Essential (primary) hypertension: Secondary | ICD-10-CM

## 2015-10-16 DIAGNOSIS — E1165 Type 2 diabetes mellitus with hyperglycemia: Secondary | ICD-10-CM

## 2015-10-16 DIAGNOSIS — Z9111 Patient's noncompliance with dietary regimen: Secondary | ICD-10-CM

## 2015-10-16 DIAGNOSIS — Z794 Long term (current) use of insulin: Secondary | ICD-10-CM

## 2015-10-16 DIAGNOSIS — Z125 Encounter for screening for malignant neoplasm of prostate: Secondary | ICD-10-CM

## 2015-10-16 DIAGNOSIS — Z91148 Patient's other noncompliance with medication regimen for other reason: Secondary | ICD-10-CM

## 2015-10-16 DIAGNOSIS — F331 Major depressive disorder, recurrent, moderate: Secondary | ICD-10-CM

## 2015-10-16 DIAGNOSIS — E538 Deficiency of other specified B group vitamins: Secondary | ICD-10-CM

## 2015-10-16 DIAGNOSIS — Z91119 Patient's noncompliance with dietary regimen due to unspecified reason: Secondary | ICD-10-CM

## 2015-10-16 DIAGNOSIS — Z91199 Patient's noncompliance with other medical treatment and regimen due to unspecified reason: Secondary | ICD-10-CM

## 2015-10-16 DIAGNOSIS — Z9114 Patient's other noncompliance with medication regimen: Secondary | ICD-10-CM

## 2015-10-16 LAB — HM DIABETES FOOT EXAM

## 2015-10-16 MED ORDER — PIOGLITAZONE HCL 45 MG PO TABS: 45.0000 mg | ORAL_TABLET | Freq: Every day | ORAL | Status: DC

## 2015-10-16 MED ORDER — GLUCOSE BLOOD VI STRP: ORAL_STRIP | Status: DC

## 2015-10-16 MED ORDER — "INSULIN SYRINGE-NEEDLE U-100 30G X 1/2"" 0.5 ML MISC": Status: DC

## 2015-10-16 MED ORDER — ONETOUCH ULTRASOFT LANCETS MISC: Status: DC

## 2015-10-16 MED ORDER — INSULIN NPH ISOPHANE & REGULAR (70-30) 100 UNIT/ML ~~LOC~~ SUSP: SUBCUTANEOUS | Status: DC

## 2015-10-16 MED ORDER — ONETOUCH ULTRA SYSTEM W/DEVICE KIT: PACK | Status: DC

## 2015-10-16 NOTE — Patient Instructions (Addendum)
Check blood sugar fasting in morning and 2 hours after a meal each day. Twice a day. Restart actos 45 mg daily.  Get back on track with healthy low carb diet, exercise as tolerated ( walking), and weight loss.  Keep appt for fasting labs next week.

## 2015-10-16 NOTE — Progress Notes (Signed)
   Subjective:    Patient ID: [name], male    DOB: 20-Jun-1951, 65 y.o.   MRN: [id number]  HPI  65 year old male pt presents for DM follow up. He is not compliant with appointments and was last seen for DM 11/2014 .  Diabetes:  He is overdue for re-eval. Did not tolearte victoza and byetta in past.  Metformin cause diarrhea. Did not tolerate levemir. On 70/30 80 units in AM and 75 Units in PM and NO LONGER TAKING  actos 45 mg daily. Lab Results  Component Value Date   HGBA1C 9.2* 12/23/2014  Using medications without difficulties:? Hypoglycemic episodes:? Hyperglycemic episodes:? Feet problems:None Blood Sugars averaging: does not have meter, needs prescription. eye exam within last year: 07/2015  Elevated Cholesterol: Due for re-eval. Poor control previously. SE to statins in past. HX of CAD.Marland Kitchen Goal LDL < 70. Using medications without problems: Muscle aches:  Diet compliance:poor Exercise none   HTN, well controlled on Dyazide. BP Readings from Last 3 Encounters:  10/16/15 140/72  09/28/15 154/68  07/03/15 189/72  Not checking BP at home.  No CP, NO SOB. He has been very poorly motivated in past.  Major moderate depression:  Reports resolved. No longer taking sertraline. Gave him SE>       Review of Systems  Constitutional: Positive for fatigue. Negative for fever.  HENT: Negative for ear pain.   Eyes: Negative for pain.  Respiratory: Negative for shortness of breath and wheezing.   Cardiovascular: Negative for chest pain.  Gastrointestinal: Negative for abdominal pain.       Objective:   Physical Exam  Constitutional: He is oriented to person, place, and time. Vital signs are normal. He appears well-developed and well-nourished.  Morbid obesity   HENT:  Head: Normocephalic.  Right Ear: Hearing normal.  Left Ear: Hearing normal.  Nose: Nose normal.  Mouth/Throat: Oropharynx is clear and moist and mucous membranes are normal.  Neck: Trachea  normal. Carotid bruit is not present. No thyroid mass and no thyromegaly present.  Cardiovascular: Normal rate, regular rhythm and normal pulses.  Exam reveals no gallop, no distant heart sounds and no friction rub.   No murmur heard. No peripheral edema  Pulmonary/Chest: Effort normal and breath sounds normal. No respiratory distress.  Neurological: He is alert and oriented to person, place, and time.  Skin: Skin is warm, dry and intact. No rash noted.  Psychiatric: He has a normal mood and affect. His speech is normal and behavior is normal. Thought content normal.  Diabetic foot exam: Normal inspection No skin breakdown No calluses  Normal DP pulses Normal sensation to light touch and monofilament Nails normal         Assessment & Plan:

## 2015-10-16 NOTE — Progress Notes (Signed)
Pre visit review using our clinic review tool, if applicable. No additional management support is needed unless otherwise documented below in the visit note. 

## 2015-10-19 ENCOUNTER — Other Ambulatory Visit: Payer: Self-pay | Admitting: Family Medicine

## 2015-10-19 ENCOUNTER — Other Ambulatory Visit (INDEPENDENT_AMBULATORY_CARE_PROVIDER_SITE_OTHER): Payer: PPO

## 2015-10-19 DIAGNOSIS — E538 Deficiency of other specified B group vitamins: Secondary | ICD-10-CM | POA: Diagnosis not present

## 2015-10-19 DIAGNOSIS — E1165 Type 2 diabetes mellitus with hyperglycemia: Secondary | ICD-10-CM | POA: Diagnosis not present

## 2015-10-19 DIAGNOSIS — Z125 Encounter for screening for malignant neoplasm of prostate: Secondary | ICD-10-CM

## 2015-10-19 DIAGNOSIS — E113399 Type 2 diabetes mellitus with moderate nonproliferative diabetic retinopathy without macular edema, unspecified eye: Secondary | ICD-10-CM | POA: Diagnosis not present

## 2015-10-19 DIAGNOSIS — Z794 Long term (current) use of insulin: Secondary | ICD-10-CM | POA: Diagnosis not present

## 2015-10-19 DIAGNOSIS — Z1159 Encounter for screening for other viral diseases: Secondary | ICD-10-CM

## 2015-10-19 LAB — COMPREHENSIVE METABOLIC PANEL
ALT: 24 U/L (ref 0–53)
AST: 16 U/L (ref 0–37)
Albumin: 4.2 g/dL (ref 3.5–5.2)
Alkaline Phosphatase: 71 U/L (ref 39–117)
BUN: 23 mg/dL (ref 6–23)
CO2: 30 mEq/L (ref 19–32)
Calcium: 9.8 mg/dL (ref 8.4–10.5)
Chloride: 102 mEq/L (ref 96–112)
Creatinine, Ser: 1.09 mg/dL (ref 0.40–1.50)
GFR: 71.95 mL/min (ref >60.00)
Glucose, Bld: 281 mg/dL — ABNORMAL HIGH (ref 70–99)
Potassium: 5.3 mEq/L — ABNORMAL HIGH (ref 3.5–5.1)
Sodium: 142 mEq/L (ref 135–145)
Total Bilirubin: 0.4 mg/dL (ref 0.2–1.2)
Total Protein: 6.9 g/dL (ref 6.0–8.3)

## 2015-10-19 LAB — LIPID PANEL
Cholesterol: 245 mg/dL — ABNORMAL HIGH (ref 0–200)
HDL: 37.7 mg/dL — ABNORMAL LOW (ref >39.00)
LDL Cholesterol: 174 mg/dL — ABNORMAL HIGH (ref 0–99)
NonHDL: 206.97
Total CHOL/HDL Ratio: 6
Triglycerides: 165 mg/dL — ABNORMAL HIGH (ref 0.0–149.0)
VLDL: 33 mg/dL (ref 0.0–40.0)

## 2015-10-19 LAB — PSA, MEDICARE: PSA: 0.27 ng/ml (ref 0.10–4.00)

## 2015-10-19 LAB — VITAMIN B12: Vitamin B-12: >1500 pg/mL — ABNORMAL HIGH (ref 211–911)

## 2015-10-19 LAB — HEMOGLOBIN A1C: Hgb A1c MFr Bld: 10.1 % — ABNORMAL HIGH (ref 4.6–6.5)

## 2015-10-20 ENCOUNTER — Encounter: Payer: Self-pay | Admitting: *Deleted

## 2015-10-20 ENCOUNTER — Telehealth: Payer: Self-pay | Admitting: Family Medicine

## 2015-10-20 LAB — HEPATITIS C ANTIBODY

## 2015-10-20 NOTE — Telephone Encounter (Signed)
Left message for Gunnar Fusi (wife) to return my call.

## 2015-10-20 NOTE — Addendum Note (Signed)
Addended by: Alvina Chou on: 10/20/2015 04:36 PM   Modules accepted: Orders

## 2015-10-20 NOTE — Telephone Encounter (Signed)
Labs results and instructions discussed with Gunnar Fusi (wife) as requested by Mr. Amiri.  I also mailed letter with results and instructions to patient.  Medication list updated.

## 2015-10-20 NOTE — Telephone Encounter (Signed)
Pt returned call - please call wife at [phone] due to pr being at work Thanks

## 2015-10-21 ENCOUNTER — Other Ambulatory Visit: Payer: Self-pay | Admitting: Family Medicine

## 2015-10-21 DIAGNOSIS — Z1159 Encounter for screening for other viral diseases: Secondary | ICD-10-CM | POA: Diagnosis not present

## 2015-10-21 LAB — HEPATITIS C ANTIBODY: HCV Ab: NEGATIVE

## 2015-10-21 NOTE — Addendum Note (Signed)
Addended by: Baldomero Lamy on: 10/21/2015 10:17 AM   Modules accepted: Orders

## 2015-10-22 LAB — HEPATITIS C ANTIBODY: HCV Ab: NEGATIVE

## 2015-10-27 ENCOUNTER — Other Ambulatory Visit: Payer: Self-pay | Admitting: *Deleted

## 2015-10-27 MED ORDER — "INSULIN SYRINGE-NEEDLE U-100 30G X 1/2"" 0.5 ML MISC": Status: DC

## 2015-11-05 ENCOUNTER — Other Ambulatory Visit: Payer: Self-pay | Admitting: Family Medicine

## 2015-11-05 DIAGNOSIS — Z91199 Patient's noncompliance with other medical treatment and regimen due to unspecified reason: Secondary | ICD-10-CM | POA: Insufficient documentation

## 2015-11-05 DIAGNOSIS — Z9111 Patient's noncompliance with dietary regimen: Secondary | ICD-10-CM | POA: Insufficient documentation

## 2015-11-05 DIAGNOSIS — Z9119 Patient's noncompliance with other medical treatment and regimen: Secondary | ICD-10-CM | POA: Insufficient documentation

## 2015-11-05 DIAGNOSIS — Z9114 Patient's other noncompliance with medication regimen: Secondary | ICD-10-CM

## 2015-11-05 NOTE — Assessment & Plan Note (Signed)
Per pt resolved off sertraline but still amotivational and same depressed affect.

## 2015-11-05 NOTE — Assessment & Plan Note (Signed)
Due for re-eval. 

## 2015-11-05 NOTE — Assessment & Plan Note (Signed)
Well controlled. Continue current medication. Encouraged exercise, weight loss, healthy eating habits.  

## 2015-11-05 NOTE — Assessment & Plan Note (Signed)
Due for re-eval.  Check blood sugar fasting in morning and 2 hours after a meal each day. Twice a day. Restart actos 45 mg daily.  Get back on track with healthy low carb diet, exercise as tolerated ( walking), and weight loss.

## 2015-11-05 NOTE — Assessment & Plan Note (Signed)
Followed by opthamologist. Needs better DM control.

## 2015-11-19 ENCOUNTER — Other Ambulatory Visit: Payer: Self-pay | Admitting: Family Medicine

## 2015-11-20 ENCOUNTER — Encounter: Payer: Self-pay | Admitting: Family Medicine

## 2015-11-20 ENCOUNTER — Ambulatory Visit (INDEPENDENT_AMBULATORY_CARE_PROVIDER_SITE_OTHER): Payer: Medicare Other | Admitting: Family Medicine

## 2015-11-20 VITALS — BP 142/68 | HR 84 | Temp 97.8°F | Ht 66.0 in | Wt 306.8 lb

## 2015-11-20 DIAGNOSIS — E785 Hyperlipidemia, unspecified: Secondary | ICD-10-CM

## 2015-11-20 DIAGNOSIS — I10 Essential (primary) hypertension: Secondary | ICD-10-CM | POA: Diagnosis not present

## 2015-11-20 DIAGNOSIS — Z0000 Encounter for general adult medical examination without abnormal findings: Secondary | ICD-10-CM | POA: Diagnosis not present

## 2015-11-20 DIAGNOSIS — E113399 Type 2 diabetes mellitus with moderate nonproliferative diabetic retinopathy without macular edema, unspecified eye: Secondary | ICD-10-CM

## 2015-11-20 DIAGNOSIS — I2510 Atherosclerotic heart disease of native coronary artery without angina pectoris: Secondary | ICD-10-CM | POA: Diagnosis not present

## 2015-11-20 DIAGNOSIS — E1165 Type 2 diabetes mellitus with hyperglycemia: Secondary | ICD-10-CM

## 2015-11-20 DIAGNOSIS — Z794 Long term (current) use of insulin: Secondary | ICD-10-CM

## 2015-11-20 DIAGNOSIS — Z7189 Other specified counseling: Secondary | ICD-10-CM | POA: Insufficient documentation

## 2015-11-20 LAB — HM DIABETES FOOT EXAM

## 2015-11-20 NOTE — Progress Notes (Signed)
Subjective:    Patient ID: [name], male    DOB: 1951/07/23, 65 y.o.   MRN: [id number]  HPI I have personally reviewed the Medicare Annual Wellness questionnaire and have noted 1. [name] medical and social history 2. Their use of alcohol, tobacco or illicit drugs 3. Their current medications and supplements 4. [name] functional ability including ADL's, fall risks, home safety risks and hearing or visual             impairment. 5. Diet and physical activities 6. Evidence for depression or mood disorders 7.         Updated provider list Cognitive evaluation was performed and recorded on pt medicare questionnaire form. The patients weight, height, BMI and visual acuity have been recorded in the chart  I have made referrals, counseling and provided education to the patient based review of the above and I have provided the pt with a written personalized care plan for preventive services.   Pt resistant and noncompliant with medications in past. Today he is very motivated.  Diabetes:  Tried to restart but had lightheadedness with actos so stoppedpoor control despite 70/30 80 units BID. " I wants to start with exercisie and diet changes, I know I can do it if I try" He refuses no no more meds.   Lab Results  Component Value Date   HGBA1C 10.1* 10/19/2015  Using medications without difficulties: Hypoglycemic episodes:none Hyperglycemic episodes: 260 after ice cream Feet problems:none Blood Sugars averaging: FBS 150-160 eye exam within last year: every 3 months  Elevated Cholesterol:  Poor control, pt want to try lifestlye changes given SE to meds in past. Lab Results  Component Value Date   CHOL 245* 10/19/2015   HDL 37.70* 10/19/2015   LDLCALC 174* 10/19/2015   LDLDIRECT 186.9 05/31/2013   TRIG 165.0* 10/19/2015   CHOLHDL 6 10/19/2015  SE to statins in past. HX of CAD.Marland Kitchen. Goal LDL < 70. Using medications without problems: Mu Diet compliance:poor Exercise  none   HTN, moderate control on Dyazide. BP Readings from Last 3 Encounters:  11/20/15 142/68  10/16/15 140/72  09/28/15 154/68  Not checking BP at home. No CP, NO SOB.  Major moderate depression: Reports resolved. No longer taking sertraline. Gave him SE>            Review of Systems  Constitutional: Positive for fatigue. Negative for fever.  HENT: Negative for ear pain.   Eyes: Negative for pain.  Respiratory: Positive for shortness of breath. Negative for cough.   Cardiovascular: Negative for chest pain, palpitations and leg swelling.  Gastrointestinal: Negative for abdominal pain.  Genitourinary: Negative for dysuria.  Musculoskeletal: Negative for arthralgias.  Neurological: Negative for syncope, light-headedness and headaches.  Psychiatric/Behavioral: Negative for dysphoric mood.       Objective:   Physical Exam  Constitutional: He is oriented to person, place, and time. Vital signs are normal. He appears well-developed and well-nourished.  Morbid obesity   HENT:  Head: Normocephalic.  Right Ear: Hearing normal.  Left Ear: Hearing normal.  Nose: Nose normal.  Mouth/Throat: Oropharynx is clear and moist and mucous membranes are normal.  Neck: Trachea normal. Carotid bruit is not present. No thyroid mass and no thyromegaly present.  Cardiovascular: Normal rate, regular rhythm and normal pulses.  Exam reveals no gallop, no distant heart sounds and no friction rub.   No murmur heard. No peripheral edema  Pulmonary/Chest: Effort normal and breath sounds normal. No respiratory distress.  Neurological: He  is alert and oriented to person, place, and time.  Skin: Skin is warm, dry and intact. No rash noted.  Psychiatric: He has a normal mood and affect. His speech is normal and behavior is normal. Thought content normal.    Diabetic foot exam: Normal inspection No skin breakdown No calluses  Normal DP pulses Normal sensation to light touch and  monofilament Nails normal       Assessment & Plan:  [name] preventative maintenance and recommended screening tests for an annual wellness exam were reviewed in full today. Brought up to date unless services declined.  Counselled on the importance of diet, exercise, and its role in overall health and mortality. [name] FH and SH was reviewed, including their home life, tobacco status, and drug and alcohol status.   Vaccines: uptodate except for shingles, will consider.  Colon: nml 2011, repeat in 10 years  PSA:  stable Lab Results  Component Value Date   PSA 0.27 10/19/2015   PSA 0.25 02/22/2013   PSA 0.36 01/04/2010  nonsmoker  Hep C:done

## 2015-11-20 NOTE — Assessment & Plan Note (Signed)
Not at goal.. Pt refuses med at this time.. Will consider welchol if not at goal in 3 months.

## 2015-11-20 NOTE — Assessment & Plan Note (Signed)
No HCPOA or living will. Info packet given ( 2017)

## 2015-11-20 NOTE — Assessment & Plan Note (Signed)
Borderline control. Recommended ADSH diet and lifestyle changes. Continue dyazide.

## 2015-11-20 NOTE — Assessment & Plan Note (Signed)
Followed by eye MD. 

## 2015-11-20 NOTE — Assessment & Plan Note (Signed)
Pt now more motivated to make a change. Continue insulin at 80 BID and work on weight loss, exercise and diet changes. Pt refuses other meds at this point.

## 2015-11-20 NOTE — Patient Instructions (Addendum)
Stop at lab on way out for urine microalbumin. Work on low [place] diet, start regular exercise and  work on weight loss. Decrease animal fats.. Decrease cheese, butter, cream Decrease portion size.  Hold multivitamin for now. Avoid bananas. Replace with apples.  Call insurance to determine if shingles vaccine.

## 2015-11-20 NOTE — Progress Notes (Signed)
Pre visit review using our clinic review tool, if applicable. No additional management support is needed unless otherwise documented below in the visit note. 

## 2015-11-20 NOTE — Assessment & Plan Note (Signed)
Stable control. NO symptoms

## 2015-12-24 ENCOUNTER — Inpatient Hospital Stay
Admission: EM | Admit: 2015-12-24 | Discharge: 2015-12-26 | DRG: 247 | Disposition: A | Discharge status: Home / Self Care | Payer: PPO | Attending: Internal Medicine | Admitting: Internal Medicine

## 2015-12-24 ENCOUNTER — Encounter: Payer: Self-pay | Admitting: Emergency Medicine

## 2015-12-24 ENCOUNTER — Emergency Department: Payer: PPO

## 2015-12-24 DIAGNOSIS — Z8249 Family history of ischemic heart disease and other diseases of the circulatory system: Secondary | ICD-10-CM | POA: Diagnosis not present

## 2015-12-24 DIAGNOSIS — R079 Chest pain, unspecified: Secondary | ICD-10-CM

## 2015-12-24 DIAGNOSIS — Z794 Long term (current) use of insulin: Secondary | ICD-10-CM

## 2015-12-24 DIAGNOSIS — I200 Unstable angina: Secondary | ICD-10-CM | POA: Diagnosis not present

## 2015-12-24 DIAGNOSIS — M545 Low back pain: Secondary | ICD-10-CM | POA: Diagnosis not present

## 2015-12-24 DIAGNOSIS — E785 Hyperlipidemia, unspecified: Secondary | ICD-10-CM | POA: Diagnosis present

## 2015-12-24 DIAGNOSIS — Z888 Allergy status to other drugs, medicaments and biological substances status: Secondary | ICD-10-CM | POA: Diagnosis not present

## 2015-12-24 DIAGNOSIS — Z6841 Body Mass Index (BMI) 40.0 and over, adult: Secondary | ICD-10-CM | POA: Diagnosis not present

## 2015-12-24 DIAGNOSIS — F17290 Nicotine dependence, other tobacco product, uncomplicated: Secondary | ICD-10-CM | POA: Diagnosis present

## 2015-12-24 DIAGNOSIS — Z87442 Personal history of urinary calculi: Secondary | ICD-10-CM | POA: Diagnosis not present

## 2015-12-24 DIAGNOSIS — Z9114 Patient's other noncompliance with medication regimen: Secondary | ICD-10-CM

## 2015-12-24 DIAGNOSIS — Z820 Family history of epilepsy and other diseases of the nervous system: Secondary | ICD-10-CM | POA: Diagnosis not present

## 2015-12-24 DIAGNOSIS — I214 Non-ST elevation (NSTEMI) myocardial infarction: Principal | ICD-10-CM | POA: Diagnosis present

## 2015-12-24 DIAGNOSIS — G8929 Other chronic pain: Secondary | ICD-10-CM | POA: Diagnosis not present

## 2015-12-24 DIAGNOSIS — I25110 Atherosclerotic heart disease of native coronary artery with unstable angina pectoris: Secondary | ICD-10-CM | POA: Diagnosis not present

## 2015-12-24 DIAGNOSIS — E1165 Type 2 diabetes mellitus with hyperglycemia: Secondary | ICD-10-CM | POA: Diagnosis present

## 2015-12-24 DIAGNOSIS — Z825 Family history of asthma and other chronic lower respiratory diseases: Secondary | ICD-10-CM

## 2015-12-24 DIAGNOSIS — Z833 Family history of diabetes mellitus: Secondary | ICD-10-CM

## 2015-12-24 DIAGNOSIS — I10 Essential (primary) hypertension: Secondary | ICD-10-CM | POA: Diagnosis not present

## 2015-12-24 DIAGNOSIS — E781 Pure hyperglyceridemia: Secondary | ICD-10-CM | POA: Diagnosis present

## 2015-12-24 DIAGNOSIS — E119 Type 2 diabetes mellitus without complications: Secondary | ICD-10-CM | POA: Diagnosis not present

## 2015-12-24 HISTORY — DX: Atherosclerotic heart disease of native coronary artery without angina pectoris: I25.10

## 2015-12-24 HISTORY — DX: Morbid (severe) obesity due to excess calories: E66.01

## 2015-12-24 LAB — CBC
HCT: 41.8 % (ref 40.0–52.0)
Hemoglobin: 14.2 g/dL (ref 13.0–18.0)
MCH: 30.1 pg (ref 26.0–34.0)
MCHC: 34.1 g/dL (ref 32.0–36.0)
MCV: 88.2 fL (ref 80.0–100.0)
Platelets: 200 10*3/uL (ref 150–440)
RBC: 4.73 MIL/uL (ref 4.40–5.90)
RDW: 14.4 % (ref 11.5–14.5)
WBC: 7 10*3/uL (ref 3.8–10.6)

## 2015-12-24 LAB — GLUCOSE, CAPILLARY
Glucose-Capillary: 260 mg/dL — ABNORMAL HIGH (ref 65–99)
Glucose-Capillary: 273 mg/dL — ABNORMAL HIGH (ref 65–99)

## 2015-12-24 LAB — APTT: aPTT: 28 s (ref 24–36)

## 2015-12-24 LAB — BASIC METABOLIC PANEL WITH GFR
Anion gap: 13 (ref 5–15)
BUN: 24 mg/dL — ABNORMAL HIGH (ref 6–20)
CO2: 25 mmol/L (ref 22–32)
Calcium: 9.4 mg/dL (ref 8.9–10.3)
Chloride: 99 mmol/L — ABNORMAL LOW (ref 101–111)
Creatinine, Ser: 1.04 mg/dL (ref 0.61–1.24)
GFR calc Af Amer: >60 mL/min
GFR calc non Af Amer: >60 mL/min
Glucose, Bld: 357 mg/dL — ABNORMAL HIGH (ref 65–99)
Potassium: 3.8 mmol/L (ref 3.5–5.1)
Sodium: 137 mmol/L (ref 135–145)

## 2015-12-24 LAB — PROTIME-INR
INR: 0.96
Prothrombin Time: 13 s (ref 11.4–15.0)

## 2015-12-24 LAB — TROPONIN I
Troponin I: 0.08 ng/mL — ABNORMAL HIGH (ref <0.031)
Troponin I: 0.12 ng/mL — ABNORMAL HIGH (ref <0.031)

## 2015-12-24 MED ORDER — SODIUM CHLORIDE 0.9 % WEIGHT BASED INFUSION
3.0000 mL/kg/h | INTRAVENOUS | Status: AC
  Administered 2015-12-25: 3 mL/kg/h via INTRAVENOUS

## 2015-12-24 MED ORDER — CARVEDILOL 6.25 MG PO TABS
6.2500 mg | ORAL_TABLET | Freq: Two times a day (BID) | ORAL | Status: DC
  Administered 2015-12-24 – 2015-12-26 (×4): 6.25 mg via ORAL
  Filled 2015-12-24 (×4): qty 1

## 2015-12-24 MED ORDER — ASPIRIN EC 81 MG PO TBEC
81.0000 mg | DELAYED_RELEASE_TABLET | Freq: Every day | ORAL | Status: DC
  Filled 2015-12-24: qty 1

## 2015-12-24 MED ORDER — ACETAMINOPHEN 325 MG PO TABS: 650.0000 mg | ORAL_TABLET | Freq: Four times a day (QID) | ORAL | Status: DC | PRN

## 2015-12-24 MED ORDER — HEPARIN (PORCINE) IN NACL 100-0.45 UNIT/ML-% IJ SOLN
1600.0000 [IU]/h | INTRAMUSCULAR | Status: DC
  Administered 2015-12-24: 1250 [IU]/h via INTRAVENOUS
  Administered 2015-12-25: 1600 [IU]/h via INTRAVENOUS
  Filled 2015-12-24 (×4): qty 250

## 2015-12-24 MED ORDER — NITROGLYCERIN 0.4 MG SL SUBL: 0.4000 mg | SUBLINGUAL_TABLET | SUBLINGUAL | Status: DC | PRN

## 2015-12-24 MED ORDER — INSULIN ASPART 100 UNIT/ML ~~LOC~~ SOLN
0.0000 [IU] | Freq: Three times a day (TID) | SUBCUTANEOUS | Status: DC
  Administered 2015-12-24 – 2015-12-25 (×3): 8 [IU] via SUBCUTANEOUS
  Administered 2015-12-26: 2 [IU] via SUBCUTANEOUS
  Filled 2015-12-24: qty 8
  Filled 2015-12-24: qty 2
  Filled 2015-12-24 (×2): qty 8

## 2015-12-24 MED ORDER — METOPROLOL TARTRATE 25 MG PO TABS
25.0000 mg | ORAL_TABLET | Freq: Two times a day (BID) | ORAL | Status: DC
  Filled 2015-12-24: qty 1

## 2015-12-24 MED ORDER — MORPHINE SULFATE (PF) 2 MG/ML IV SOLN: 1.0000 mg | INTRAVENOUS | Status: DC | PRN

## 2015-12-24 MED ORDER — HEPARIN SODIUM (PORCINE) 5000 UNIT/ML IJ SOLN: 4000.0000 [IU] | Freq: Once | INTRAMUSCULAR | Status: DC

## 2015-12-24 MED ORDER — ONDANSETRON HCL 4 MG PO TABS: 4.0000 mg | ORAL_TABLET | Freq: Four times a day (QID) | ORAL | Status: DC | PRN

## 2015-12-24 MED ORDER — ASPIRIN 81 MG PO CHEW
81.0000 mg | CHEWABLE_TABLET | ORAL | Status: AC
  Administered 2015-12-25: 81 mg via ORAL
  Filled 2015-12-24: qty 1

## 2015-12-24 MED ORDER — MORPHINE SULFATE (PF) 2 MG/ML IV SOLN
2.0000 mg | INTRAVENOUS | Status: DC | PRN
  Administered 2015-12-25: 2 mg via INTRAVENOUS

## 2015-12-24 MED ORDER — HEPARIN BOLUS VIA INFUSION
4000.0000 [IU] | Freq: Once | INTRAVENOUS | Status: AC
  Administered 2015-12-24: 4000 [IU] via INTRAVENOUS
  Filled 2015-12-24: qty 4000

## 2015-12-24 MED ORDER — ONDANSETRON HCL 4 MG/2ML IJ SOLN: 4.0000 mg | Freq: Four times a day (QID) | INTRAMUSCULAR | Status: DC | PRN

## 2015-12-24 MED ORDER — SODIUM CHLORIDE 0.9 % IV SOLN: 250.0000 mL | INTRAVENOUS | Status: DC | PRN

## 2015-12-24 MED ORDER — NITROGLYCERIN 2 % TD OINT
1.0000 [in_us] | TOPICAL_OINTMENT | Freq: Once | TRANSDERMAL | Status: AC
  Administered 2015-12-24: 1 [in_us] via TOPICAL

## 2015-12-24 MED ORDER — ASPIRIN 81 MG PO CHEW
324.0000 mg | CHEWABLE_TABLET | Freq: Once | ORAL | Status: AC
  Administered 2015-12-24: 324 mg via ORAL

## 2015-12-24 MED ORDER — SODIUM CHLORIDE 0.9% FLUSH: 3.0000 mL | INTRAVENOUS | Status: DC | PRN

## 2015-12-24 MED ORDER — SODIUM CHLORIDE 0.9% FLUSH
3.0000 mL | Freq: Two times a day (BID) | INTRAVENOUS | Status: DC
  Administered 2015-12-24: 3 mL via INTRAVENOUS

## 2015-12-24 MED ORDER — TRIAMTERENE-HCTZ 50-25 MG PO CAPS: 1.0000 | ORAL_CAPSULE | Freq: Every morning | ORAL | Status: DC

## 2015-12-24 MED ORDER — TRIAMTERENE-HCTZ 37.5-25 MG PO TABS
1.0000 | ORAL_TABLET | Freq: Every day | ORAL | Status: DC
  Administered 2015-12-26: 1 via ORAL
  Filled 2015-12-24: qty 1

## 2015-12-24 MED ORDER — INSULIN ASPART PROT & ASPART (70-30 MIX) 100 UNIT/ML ~~LOC~~ SUSP
80.0000 [IU] | Freq: Two times a day (BID) | SUBCUTANEOUS | Status: DC
  Administered 2015-12-25 – 2015-12-26 (×2): 80 [IU] via SUBCUTANEOUS
  Filled 2015-12-24 (×2): qty 80

## 2015-12-24 MED ORDER — CLOPIDOGREL BISULFATE 75 MG PO TABS
300.0000 mg | ORAL_TABLET | Freq: Once | ORAL | Status: AC
  Administered 2015-12-24: 300 mg via ORAL
  Filled 2015-12-24: qty 4

## 2015-12-24 MED ORDER — AMLODIPINE BESYLATE 5 MG PO TABS
5.0000 mg | ORAL_TABLET | Freq: Every day | ORAL | Status: DC
  Administered 2015-12-24 – 2015-12-26 (×2): 5 mg via ORAL
  Filled 2015-12-24 (×2): qty 1

## 2015-12-24 MED ORDER — ALPRAZOLAM 0.25 MG PO TABS
0.2500 mg | ORAL_TABLET | Freq: Two times a day (BID) | ORAL | Status: DC | PRN
  Administered 2015-12-24 – 2015-12-25 (×2): 0.25 mg via ORAL
  Filled 2015-12-24 (×2): qty 1

## 2015-12-24 MED ORDER — ACETAMINOPHEN 650 MG RE SUPP: 650.0000 mg | Freq: Four times a day (QID) | RECTAL | Status: DC | PRN

## 2015-12-24 MED ORDER — HEPARIN (PORCINE) IN NACL 100-0.45 UNIT/ML-% IJ SOLN: 10.0000 [IU]/kg/h | Freq: Once | INTRAMUSCULAR | Status: DC

## 2015-12-24 MED ORDER — NITROGLYCERIN 2 % TD OINT
TOPICAL_OINTMENT | TRANSDERMAL | Status: AC
  Filled 2015-12-24: qty 1

## 2015-12-24 MED ORDER — ASPIRIN 81 MG PO CHEW
CHEWABLE_TABLET | ORAL | Status: AC
  Filled 2015-12-24: qty 4

## 2015-12-24 MED ORDER — HYDRALAZINE HCL 20 MG/ML IJ SOLN
10.0000 mg | Freq: Four times a day (QID) | INTRAMUSCULAR | Status: DC | PRN
  Administered 2015-12-24: 10 mg via INTRAVENOUS
  Filled 2015-12-24: qty 1

## 2015-12-24 MED ORDER — SODIUM CHLORIDE 0.9 % WEIGHT BASED INFUSION
1.0000 mL/kg/h | INTRAVENOUS | Status: DC
  Administered 2015-12-25: 1 mL/kg/h via INTRAVENOUS

## 2015-12-24 NOTE — ED Notes (Signed)
Patient transported to X-ray 

## 2015-12-24 NOTE — H&P (Addendum)
[facility] [place] at [facility]   PATIENT NAME: [name]    MR#:  [id number]  DATE OF BIRTH:  1950-10-23  DATE OF ADMISSION:  12/24/2015  PRIMARY CARE PHYSICIAN: Kerby Nora, MD   REQUESTING/REFERRING PHYSICIAN: Dr. Juliette Alcide  CHIEF COMPLAINT:   Chest pain today  HISTORY OF PRESENT ILLNESS:  [name]  is a 65 y.o. 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 Patient said down. Minutes did not feel better or chest pain return as he tried to work again on the farm. Came to the emergency room received aspirin Plavix nitroglycerin started on heparin drip. His troponin is 0.08. The patient is bit on the higher side. His chest pain at present is 2/10. He is being admitted for further rash of management of non-Q-wave MI/unstable angina  PAST MEDICAL HISTORY:   Past Medical History  Diagnosis Date  . CAD (coronary artery disease)     mild  . Diabetes mellitus type 2, insulin dependent (HCC)   . Hyperlipemia   . Hypertension   . Osteoarthritis   . Depression   . Kidney stones   . Obesity   . Back injury 02/2002    worker's comp    PAST SURGICAL HISTOIRY:   Past Surgical History  Procedure Laterality Date  . Cardiac catheterization  09/2000    diffuse LAD 30% LCA  EF 50-60%  . Cardiac catheterization  06/2007    no significant CAD    SOCIAL HISTORY:   Social History  Substance Use Topics  . Smoking status: Never Smoker   . Smokeless tobacco: Never Used  . Alcohol Use: No    FAMILY HISTORY:   Family History  Problem Relation Age of Onset  . Alzheimer's disease Mother   . Emphysema Mother   . Diabetes Father   . Heart disease  Father     MI  . Cancer Brother     ? Neck cancer    DRUG ALLERGIES:   Allergies  Allergen Reactions  . Atorvastatin     REACTION: Body aches    REVIEW OF SYSTEMS:  Review of Systems  Constitutional: Negative for fever, chills and weight loss.  HENT: Negative for ear discharge, ear pain and nosebleeds.   Eyes: Negative for blurred vision, pain and discharge.  Respiratory: Negative for sputum production, shortness of breath, wheezing and stridor.   Cardiovascular: Positive for chest pain. Negative for palpitations, orthopnea and PND.  Gastrointestinal: Negative for nausea, vomiting, abdominal pain and diarrhea.  Genitourinary: Negative for urgency and frequency.  Musculoskeletal: Negative for back pain and joint pain.  Neurological: Negative for sensory change, speech change, focal weakness and weakness.  Psychiatric/Behavioral: Negative for depression and hallucinations. The patient is not nervous/anxious.   All other systems reviewed and are negative.    MEDICATIONS AT HOME:   Prior to Admission medications   Medication Sig Start Date End Date Taking? Authorizing Provider  insulin NPH-regular Human (NOVOLIN 70/30 RELION) (70-30) 100 UNIT/ML injection 80 units in the morning and 75 units at night. Patient taking differently: 80 units in the morning and 80 units at night. 10/16/15  Yes Amy Michelle Nasuti, MD  triamterene-hydrochlorothiazide (DYAZIDE) 50-25 MG capsule TAKE 1  CAPSULE BY MOUTH EVERY MORNING 11/05/15  Yes Amy E Bedsole, MD      VITAL SIGNS:  Blood pressure 174/87, pulse 76, temperature 98.6 F (37 C), temperature source Oral, resp. rate 17, height 5\' 6"  (1.676 m), weight 138.347 kg (305 lb), SpO2 99 %.  PHYSICAL EXAMINATION:  GENERAL:  65 y.o.-year-old patient lying in the bed with no acute distress. Morbid obesity  EYES: Pupils equal, round, reactive to light and accommodation. No scleral icterus. Extraocular muscles intact.  HEENT: Head atraumatic, normocephalic.  Oropharynx and nasopharynx clear.  NECK:  Supple, no jugular venous distention. No thyroid enlargement, no tenderness.  LUNGS: Normal breath sounds bilaterally, no wheezing, rales,rhonchi or crepitation. No use of accessory muscles of respiration.  CARDIOVASCULAR: S1, S2 normal. No murmurs, rubs, or gallops.  ABDOMEN: Soft, nontender, nondistended. Bowel sounds present. No organomegaly or mass.  EXTREMITIES: No pedal edema, cyanosis, or clubbing.  NEUROLOGIC: Cranial nerves II through XII are intact. Muscle strength 5/5 in all extremities. Sensation intact. Gait not checked.  PSYCHIATRIC: The patient is alert and oriented x 3.  SKIN: No obvious rash, lesion, or ulcer.   LABORATORY PANEL:   CBC  Recent Labs Lab 12/24/15 1432  WBC 7.0  HGB 14.2  HCT 41.8  PLT 200   ------------------------------------------------------------------------------------------------------------------  Chemistries   Recent Labs Lab 12/24/15 1432  NA 137  K 3.8  CL 99*  CO2 25  GLUCOSE 357*  BUN 24*  CREATININE 1.04  CALCIUM 9.4   ------------------------------------------------------------------------------------------------------------------  Cardiac Enzymes  Recent Labs Lab 12/24/15 1432  TROPONINI 0.08*   ------------------------------------------------------------------------------------------------------------------  RADIOLOGY:  Dg Chest 2 View  12/24/2015  CLINICAL DATA:  Chest pain for 4 days EXAM: CHEST  2 VIEW COMPARISON:  09/01/2009 FINDINGS: Cardiac shadow is at the upper limits of normal in size. The lungs are well aerated bilaterally. No focal infiltrate or sizable effusion is seen. No bony abnormality is noted. IMPRESSION: No active cardiopulmonary disease. Electronically Signed   By: Alcide CleverMark  Lukens M.D.   On: 12/24/2015 15:38    EKG:  , Sinus rhythm. T wave changes in lateral leads. No order EKG for comparison IMPRESSION AND PLAN:  [name]  is a 65 y.o. 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   1. Acute non-Q wave MI/unstable angina -Admitted to telemetry -Cycle cardiac enzymes 3 -Cardiology consultation by Dr. Kirke CorinArida -Continue aspirin, statins, beta blockers, nitroglycerin, heparin drip  2. Accelerated hypertension -Continue hydrochlorothiazide/triamterene and beta blockers   3. Morbid obesity   4. Type 2 diabetes  Scale insulin and home dose of insulin  5. DVT prophylaxis on heparin drip   6.tobacco abuse  Smoking cessation advised 3 mins spent Discussed with cardiology , patient, family  All the records are reviewed and case discussed with ED provider. Management plans discussed with the patient, family and they are in agreement.  CODE STATUS: full  TOTAL TIME TAKING CARE OF THIS PATIENT: 50 minutes.    Jeanette Rauth M.D on 12/24/2015 at 4:34 PM  Between 7am to 6pm - Pager - [phone]  After 6pm go to [email or web address] - password [email or web address]  [facility]  Office  [phone]  CC: Primary care physician; Kerby NoraAmy Bedsole, MD

## 2015-12-24 NOTE — ED Notes (Signed)
Patient presents to the ED with centralized chest pain since yesterday that is worse on exertion.  Patient denies any history of heart attacks.  Patient states prior to yesterday he was having occasional intermittent chest pain but now the chest pain seems almost constant, with exacerbations on exertion.  Patient is alert and oriented at this time.  Skin is warm and dry.  Reports an episode earlier today with nausea, dizziness, and diaphoresis.

## 2015-12-24 NOTE — Consult Note (Signed)
Cardiology Consultation Note  Patient ID: [name], MRN: [id number], DOB/AGE: Mar 04, 1951 65 y.o. Admit date: 12/24/2015   Date of Consult: 12/24/2015 Primary Physician: Kerby Nora, MD Primary Cardiologist: New to [facility]  Chief Complaint: Chest pain Reason for Consult: NSTEMI  HPI: 65 y.o. male with h/o nonobstructive CAD described in detail below, poorly controlled DM and HTN, HLD, morbid obesity, history of chest pain, depression, and chronic low back pain who presented to [facility] with approximately 2 month history of chest pain that worsened over the past several days. Initial troponin was found to be 0.08. Cardiology is consulted for further evaluation.   Patient underwent cardiac cath in 2002 that showed no signigicant CAD. He had recurrent chest pain in 2008 with cardiac cath showing normal left main, LAD, and LCx. Proximal and distal RCA with 20-30% stenosis. PDA 30% stenosed. Medical Rx was advised. No ischemic evaluations since. He openly admits he does not take good care of himself from a medical standpoint and has been noncompliant with his meds. He eats whatever he wants. His weight has been stable in the low 300 pound range.   Over the past 2 months he has had chronic chest pain with activity. This pain was not lifestyle limiting to him at that time. Pain was only with exertion and at that time not associated with symptoms. However, over the past 2 days his pain became worse to the point that he has had to stop whatever activity he is performing (walking), and rest because of his pain. Chest pain is described as severe, radiated to the left shoulder, was associated with nausea and diaphoresis. Pain is currently a 2/10 at rest. He notes with mild exertion his pain would be back to "severe." He has had issues with LE edema for many years. No orthopnea. He does note early satiety. He was never a smoker, rarely drank ETOH, and denies any illegal drugs. His brother recently suffered an MI the  year prior. He is uncertain of his father's medical history. His mother has dementia.   Upon the patient's arrival to [facility] they were found to have a troponin of 0.08, SBP in the 180's to 190's, glucose 357, . ECG showed NSR, 83 bpm, left axis deviation, nonspecific lateral st/t changes, CXR nonacute. He was started on heparin gtt and nitro paste. BP remains elevated in the 180s systolic.    Past Medical History  Diagnosis Date  . Coronary artery disease, non-occlusive     a. cath 2002 with no sig CAD; b. cath 2008 normal LM, LAD, LCx, p&dRCA 20-30%, PDA 30%  . Diabetes mellitus type 2, insulin dependent (HCC)   . Hyperlipemia   . Hypertension   . Osteoarthritis   . Depression   . Kidney stones   . Morbid obesity (HCC)   . Back injury 02/2002    worker's comp      Most Recent Cardiac Studies: Cardiac cath 2002: Nonobstructive disease  Cardiac cath 2008: Normal left main, LAD, and LCx. Proximal and distal RCA with 20-30% stenosis. PDA 30% stenosed.     Surgical History:  Past Surgical History  Procedure Laterality Date  . Cardiac catheterization  09/2000    diffuse LAD 30% LCA  EF 50-60%  . Cardiac catheterization  06/2007    no significant CAD     Home Meds: Prior to Admission medications   Medication Sig Start Date End Date Taking? Authorizing Provider  insulin NPH-regular Human (NOVOLIN 70/30 RELION) (70-30) 100 UNIT/ML injection 80  units in the morning and 75 units at night. Patient taking differently: 80 units in the morning and 80 units at night. 10/16/15  Yes Amy Michelle Nasuti, MD  triamterene-hydrochlorothiazide (DYAZIDE) 50-25 MG capsule TAKE 1 CAPSULE BY MOUTH EVERY MORNING 11/05/15  Yes Amy Michelle Nasuti, MD    Inpatient Medications:  . amLODipine  5 mg Oral Daily  . carvedilol  6.25 mg Oral BID WC  . insulin aspart  0-15 Units Subcutaneous TID WC   . heparin 1,250 Units/hr (12/24/15 1643)    Allergies:  Allergies  Allergen Reactions  . Atorvastatin     REACTION: Body  aches    Social History   Social History  . Marital Status: Married    Spouse Name: N/A  . Number of Children: N/A  . Years of Education: N/A   Occupational History  . disabled     back injury   Social History Main Topics  . Smoking status: Never Smoker   . Smokeless tobacco: Never Used  . Alcohol Use: No  . Drug Use: No  . Sexual Activity: Not on file   Other Topics Concern  . Not on file   Social History Narrative   Financial concerns, wife with depression.   Minimal exercise.   Diet: poor.     Family History  Problem Relation Age of Onset  . Alzheimer's disease Mother   . Emphysema Mother   . Diabetes Father   . Heart disease Father     MI  . Cancer Brother     ? Neck cancer     Review of Systems: Review of Systems  Constitutional: Positive for malaise/fatigue and diaphoresis. Negative for fever, chills and weight loss.  HENT: Negative for congestion.   Eyes: Negative for discharge and redness.  Respiratory: Negative for cough, hemoptysis, sputum production, shortness of breath and wheezing.   Cardiovascular: Positive for chest pain and leg swelling. Negative for palpitations, orthopnea, claudication and PND.  Gastrointestinal: Positive for nausea. Negative for heartburn, vomiting and abdominal pain.  Musculoskeletal: Negative for myalgias and falls.  Skin: Negative for rash.  Neurological: Positive for weakness. Negative for sensory change, speech change, focal weakness and loss of consciousness.  Endo/Heme/Allergies: Does not bruise/bleed easily.  Psychiatric/Behavioral: Negative for substance abuse. The patient is not nervous/anxious.   All other systems reviewed and are negative.   Labs:  Recent Labs  12/24/15 1432  TROPONINI 0.08*   Lab Results  Component Value Date   WBC 7.0 12/24/2015   HGB 14.2 12/24/2015   HCT 41.8 12/24/2015   MCV 88.2 12/24/2015   PLT 200 12/24/2015     Recent Labs Lab 12/24/15 1432  NA 137  K 3.8  CL 99*    CO2 25  BUN 24*  CREATININE 1.04  CALCIUM 9.4  GLUCOSE 357*   Lab Results  Component Value Date   CHOL 245* 10/19/2015   HDL 37.70* 10/19/2015   LDLCALC 174* 10/19/2015   TRIG 165.0* 10/19/2015   Lab Results  Component Value Date   DDIMER * 07/03/2007    0.77        AT THE INHOUSE ESTABLISHED CUTOFF VALUE OF 0.48 ug/mL FEU, THIS ASSAY HAS BEEN DOCUMENTED IN THE LITERATURE TO HAVE    Radiology/Studies:  Dg Chest 2 View  12/24/2015  CLINICAL DATA:  Chest pain for 4 days EXAM: CHEST  2 VIEW COMPARISON:  09/01/2009 FINDINGS: Cardiac shadow is at the upper limits of normal in size. The lungs are well aerated  bilaterally. No focal infiltrate or sizable effusion is seen. No bony abnormality is noted. IMPRESSION: No active cardiopulmonary disease. Electronically Signed   By: Alcide CleverMark  Lukens M.D.   On: 12/24/2015 15:38    EKG: NSR, 83 bpm, left axis deviation, nonspecific lateral st/t changes  Weights: Filed Weights   12/24/15 1428  Weight: 305 lb (138.347 kg)     Physical Exam: Blood pressure 174/87, pulse 76, temperature 98.6 F (37 C), temperature source Oral, resp. rate 17, height 5\' 6"  (1.676 m), weight 305 lb (138.347 kg), SpO2 99 %. Body mass index is 49.25 kg/(m^2). General: Well developed, well nourished, in no acute distress. Head: Normocephalic, atraumatic, sclera non-icteric, no xanthomas, nares are without discharge.  Neck: Left-sided carotid bruit. JVD not elevated. Lungs: Clear bilaterally to auscultation without wheezes, rales, or rhonchi. Breathing is unlabored. Heart: RRR with S1 S2. No murmurs, rubs, or gallops appreciated. Abdomen: Obese, soft, non-tender, non-distended with normoactive bowel sounds. No hepatomegaly. No rebound/guarding. No obvious abdominal masses. Msk:  Strength and tone appear normal for age. Extremities: No clubbing or cyanosis. Trace pre-tibial edema along bilateral LE.  Distal pedal pulses are 2+ and equal bilaterally. Neuro: Alert and  oriented X 3. No facial asymmetry. No focal deficit. Moves all extremities spontaneously. Psych:  Responds to questions appropriately with a normal affect.    Assessment and Plan:   1. NSTEMI/history of nonobstructive CAD by cardiac cath in 2002 and 2008: -Schedule for cardiac cath 4/28  -Heparin gtt -Aspirin and Plavix (already given) -Work to make chest pain free, if unable to achieve this may need [facility] -Add morphine prn -Coreg 6.25 mg bid -Check fasting lipids and A1C -Check echo to evaluate LV systolic function, wall motion, and right-sided pressure -Continue to cycle troponin -Risks and benefits of cardiac catheterization have been discussed with the patient including risks of bleeding, bruising, infection, kidney damage, stroke, heart attack, and death. The patient understands these risks and is willing to proceed with the procedure. All questions have been answered and concerns listened to  2. Malignant HTN: -Add Coreg as above -Add Norvasc, titrate as needed -May need hydralazine  -Restart home medications  3. HLD: -Lipids as above -Lipitor  4. IDDM: -Poorly controlled -Per IM -A1C  5. Morbid obesity: -Weight loss advised    Elinor DodgeSigned, Kenyah Luba, PA-C Pager: [phone] 12/24/2015, 5:24 PM

## 2015-12-24 NOTE — ED Provider Notes (Signed)
[facility] Emergency Department Provider Note  ____________________________________________  Time seen: Approximately 3:59 PM  I have reviewed the triage vital signs and the nursing notes.   HISTORY  Chief Complaint Chest Pain    HPI [name] is a 65 y.o. male who complains of chest pain which is quite severe. It radiates to his left arm the back of his neck. He comes on with exertion gets worse if he continues to exert himself and he gets very weak and has to sit down because he cannot go any further. The pain then gets better. He comes in by sweating paleness and short illness of breath. She thinks she may have had something like this several months ago but he doesn't remember for sure. This is much more severe however. The pain lasts as long as he exerts himself and maybe 5 minutes after he stops.   Past Medical History  Diagnosis Date  . CAD (coronary artery disease)     mild  . Diabetes mellitus type 2, insulin dependent ([place])   . Hyperlipemia   . Hypertension   . Osteoarthritis   . Depression   . Kidney stones   . Obesity   . Back injury 02/2002    worker's comp    Patient Active Problem List   Diagnosis Date Noted  . Advanced care planning/counseling discussion 11/20/2015  . Noncompliance with diabetes treatment 11/05/2015  . Noncompliance with diet and medication regimen 11/05/2015  . Right medial knee pain 09/23/2014  . Diabetic retinopathy ([place]) 09/23/2014  . Snoring 12/21/2012  . HYPOGONADISM 09/28/2010  . VITAMIN B12 DEFICIENCY 09/28/2010  . UNSPECIFIED VITAMIN D DEFICIENCY 09/28/2010  . OTHER MALAISE AND FATIGUE 09/17/2010  . TRIGGER FINGER, RIGHT MIDDLE 09/14/2010  . [place] OCCLUSION 11/26/2009  . LEG PAIN, BILATERAL 10/06/2009  . CONSTIPATION 08/10/2009  . GASTRITIS 07/29/2009  . Major depressive disorder, recurrent episode, moderate ([place]) 07/06/2007  . ERECTILE DYSFUNCTION 04/10/2007  . Uncontrolled diabetes  mellitus with retinopathy ([place]) 03/26/2007  . OBESITY 03/26/2007  . CAD (coronary artery disease) 03/26/2007  . RENAL CALCULUS, HX OF 03/26/2007  . Hyperlipidemia LDL goal <70 12/05/2006  . Essential hypertension, benign 12/05/2006  . OSTEOARTHRITIS 12/05/2006    Past Surgical History  Procedure Laterality Date  . Cardiac catheterization  09/2000    diffuse LAD 30% LCA  EF 50-60%  . Cardiac catheterization  06/2007    no significant CAD    Current Outpatient Rx  Name  Route  Sig  Dispense  Refill  . Blood Glucose Monitoring Suppl (ONE TOUCH ULTRA SYSTEM KIT) w/Device KIT      Use to check blood sugar two times a day.  Dx: E11.319   1 each   0   . diclofenac (VOLTAREN) 75 MG EC tablet      TAKE ONE TABLET BY MOUTH TWICE DAILY   60 tablet   0   . glucose blood (ONE TOUCH TEST STRIPS) test strip      Use to check blood sugar two times a day.  Dx: E11.319   100 each   12   . insulin NPH-regular Human (NOVOLIN 70/30 RELION) (70-30) 100 UNIT/ML injection      80 units in the morning and 75 units at night. Patient taking differently: 80 units in the morning and 80 units at night.   40 mL   5   . Insulin Syringe-Needle U-100 (B-D INS SYRINGE 0.5CC/30GX1/2") 30G X 1/2" 0.5 ML MISC  Use to inject 80 units in the morning and 80 units in afternoon.  E11.319   100 each   5   . Lancets (ONETOUCH ULTRASOFT) lancets      Use to check blood sugar two times a day.  Dx: E11.319   100 each   12   . pioglitazone (ACTOS) 45 MG tablet   Oral   Take 1 tablet (45 mg total) by mouth daily.   30 tablet   3   . Red Yeast Rice 600 MG CAPS   Oral   Take 1 capsule by mouth 2 (two) times daily.         Marland Kitchen triamterene-hydrochlorothiazide (DYAZIDE) 50-25 MG capsule      TAKE 1 CAPSULE BY MOUTH EVERY MORNING   30 capsule   5     Allergies Atorvastatin  Family History  Problem Relation Age of Onset  . Alzheimer's disease Mother   . Emphysema Mother   . Diabetes Father    . Heart disease Father     MI  . Cancer Brother     ? Neck cancer    Social History Social History  Substance Use Topics  . Smoking status: Never Smoker   . Smokeless tobacco: Never Used  . Alcohol Use: No    Family history:  Patient reports his brother and grandfather uncle and possibly father have all had heart disease.  Review of Systems Constitutional: No fever/chills Eyes: No visual changes. ENT: No sore throat. Cardiovascular: chest pain. Respiratory: shortness of breath. Gastrointestinal: No abdominal pain.  No nausea, no vomiting.  No diarrhea.  No constipation. Genitourinary: Negative for dysuria. Musculoskeletal: Negative for back pain. Skin: Negative for rash. Neurological: Negative for headaches, focal weakness or numbness.  10-point ROS otherwise negative.  ____________________________________________   PHYSICAL EXAM:  VITAL SIGNS: ED Triage Vitals  Enc Vitals Group     BP 12/24/15 1429 182/85 mmHg     Pulse Rate 12/24/15 1428 82     Resp 12/24/15 1428 18     Temp 12/24/15 1428 98.6 F (37 C)     Temp Source 12/24/15 1428 Oral     SpO2 12/24/15 1428 95 %     Weight 12/24/15 1428 305 lb (138.347 kg)     Height 12/24/15 1428 5' 6"  (1.676 m)     Head Cir --      Peak Flow --      Pain Score 12/24/15 1429 2     Pain Loc --      Pain Edu? --      Excl. in [place]? --     Constitutional: Alert and oriented. Well appearing and in no acute distress. Eyes: Conjunctivae are normal. PERRL. EOMI. Head: Atraumatic. Nose: No congestion/rhinnorhea. Mouth/Throat: Mucous membranes are moist.  Oropharynx non-erythematous. Neck: No stridor.  Cardiovascular: Normal rate, regular rhythm. Grossly normal heart sounds.  Good peripheral circulation. Respiratory: Normal respiratory effort.  No retractions. Lungs CTAB. Gastrointestinal: Soft and nontender. No distention. No abdominal bruits. No CVA tenderness. Musculoskeletal: No lower extremity tenderness nor edema.   No joint effusions. Neurologic:  Normal speech and language. No gross focal neurologic deficits are appreciated. No gait instability. Skin:  Skin is warm, dry and intact. No rash noted. Psychiatric: Mood and affect are normal. Speech and behavior are normal.  ____________________________________________   LABS (all labs ordered are listed, but only abnormal results are displayed)  Labs Reviewed  BASIC METABOLIC PANEL - Abnormal; Notable for the following:  Chloride 99 (*)    Glucose, Bld 357 (*)    BUN 24 (*)    All other components within normal limits  TROPONIN I - Abnormal; Notable for the following:    Troponin I 0.08 (*)    All other components within normal limits  CBC  PROTIME-INR  APTT   ____________________________________________  EKG  EKG read and interpreted by me shows normal sinus rhythm rate of 83 left axis nonspecific ST-T wave changes especially laterally  ____________________________________________  RADIOLOGY  Chest x-ray read by radiology shows no acute disease ____________________________________________   PROCEDURES  Critical care time 10 minutes ____________________________________________   INITIAL IMPRESSION / ASSESSMENT AND PLAN / ED COURSE  Pertinent labs & imaging results that were available during my care of the patient were reviewed by me and considered in my medical decision making (see chart for details).  Patient's history sounds accurate for unstable angina we'll treat him with aspirin Plavix and heparin and nitro paste. ____________________________________________   FINAL CLINICAL IMPRESSION(S) / ED DIAGNOSES  Final diagnoses:  Chest pain, unspecified chest pain type      Nena Polio, MD 12/24/15 [phone]

## 2015-12-24 NOTE — ED Notes (Signed)
Pt in via triage with complaints of centralized chest pain since Sunday, worsening over the last two days with increasing shortness of breath.  Pt reports dizziness, sweating, weakness associated with the pain.  Pt unable to describe the pain at this time.  Pt A/Ox4, vitals WDL, family at bedside.

## 2015-12-24 NOTE — Consult Note (Signed)
ANTICOAGULATION CONSULT NOTE - Initial Consult  Pharmacy Consult for heparin drip Indication: chest pain/ACS  Allergies  Allergen Reactions  . Atorvastatin     REACTION: Body aches    Patient Measurements: Height: 5\' 6"  (167.6 cm) Weight: (!) 305 lb (138.347 kg) IBW/kg (Calculated) : 63.8 Heparin Dosing Weight: 93.7kg  Vital Signs: Temp: 98.6 F (37 C) (04/27 1428) Temp Source: Oral (04/27 1428) BP: 175/82 mmHg (04/27 1544) Pulse Rate: 76 (04/27 1544)  Labs:  Recent Labs  12/24/15 1432  HGB 14.2  HCT 41.8  PLT 200  CREATININE 1.04  TROPONINI 0.08*    Estimated Creatinine Clearance: 92.5 mL/min (by C-G formula based on Cr of 1.04).   Medical History: Past Medical History  Diagnosis Date  . CAD (coronary artery disease)     mild  . Diabetes mellitus type 2, insulin dependent (HCC)   . Hyperlipemia   . Hypertension   . Osteoarthritis   . Depression   . Kidney stones   . Obesity   . Back injury 02/2002    worker's comp    Medications:  Scheduled:  . aspirin  324 mg Oral Once  . heparin  4,000 Units Intravenous Once  . nitroGLYCERIN  1 inch Topical Once    Assessment: Pt is a 65 year old male who presents with chest pain. Review of patient chart shows no home anticoagulants. Baseline labs have been ordered.    Goal of Therapy:  Heparin level 0.3-0.7 units/ml Monitor platelets by anticoagulation protocol: Yes   Plan:  Give 4000 units bolus x 1 Start heparin infusion at 1250 units/hr Check anti-Xa level in 6 hours and daily while on heparin Continue to monitor H&H and platelets  Laurene FootmanMelissa D Lawanna Cecere 12/24/2015,4:06 PM

## 2015-12-25 ENCOUNTER — Encounter
Admission: EM | Disposition: A | Discharge status: Home / Self Care | Payer: Self-pay | Source: Home / Self Care | Attending: Internal Medicine

## 2015-12-25 ENCOUNTER — Encounter: Payer: Self-pay | Admitting: Cardiovascular Disease

## 2015-12-25 DIAGNOSIS — E119 Type 2 diabetes mellitus without complications: Secondary | ICD-10-CM | POA: Diagnosis not present

## 2015-12-25 DIAGNOSIS — I10 Essential (primary) hypertension: Secondary | ICD-10-CM | POA: Diagnosis not present

## 2015-12-25 DIAGNOSIS — E785 Hyperlipidemia, unspecified: Secondary | ICD-10-CM | POA: Diagnosis not present

## 2015-12-25 DIAGNOSIS — G8929 Other chronic pain: Secondary | ICD-10-CM | POA: Insufficient documentation

## 2015-12-25 DIAGNOSIS — I214 Non-ST elevation (NSTEMI) myocardial infarction: Secondary | ICD-10-CM | POA: Insufficient documentation

## 2015-12-25 DIAGNOSIS — I25110 Atherosclerotic heart disease of native coronary artery with unstable angina pectoris: Secondary | ICD-10-CM | POA: Diagnosis not present

## 2015-12-25 DIAGNOSIS — R079 Chest pain, unspecified: Secondary | ICD-10-CM

## 2015-12-25 DIAGNOSIS — I200 Unstable angina: Secondary | ICD-10-CM | POA: Diagnosis not present

## 2015-12-25 HISTORY — PX: CARDIAC CATHETERIZATION: SHX172

## 2015-12-25 LAB — LIPID PANEL
Cholesterol: 231 mg/dL — ABNORMAL HIGH (ref 0–200)
HDL: 33 mg/dL — ABNORMAL LOW (ref >40)
LDL Cholesterol: 161 mg/dL — ABNORMAL HIGH (ref 0–99)
Total CHOL/HDL Ratio: 7 RATIO
Triglycerides: 187 mg/dL — ABNORMAL HIGH (ref <150)
VLDL: 37 mg/dL (ref 0–40)

## 2015-12-25 LAB — COMPREHENSIVE METABOLIC PANEL
ALT: 31 U/L (ref 17–63)
AST: 22 U/L (ref 15–41)
Albumin: 3.6 g/dL (ref 3.5–5.0)
Alkaline Phosphatase: 59 U/L (ref 38–126)
Anion gap: 8 (ref 5–15)
BUN: 23 mg/dL — ABNORMAL HIGH (ref 6–20)
CO2: 27 mmol/L (ref 22–32)
Calcium: 8.7 mg/dL — ABNORMAL LOW (ref 8.9–10.3)
Chloride: 103 mmol/L (ref 101–111)
Creatinine, Ser: 0.98 mg/dL (ref 0.61–1.24)
GFR calc Af Amer: >60 mL/min (ref >60)
GFR calc non Af Amer: >60 mL/min (ref >60)
Glucose, Bld: 269 mg/dL — ABNORMAL HIGH (ref 65–99)
Potassium: 3.6 mmol/L (ref 3.5–5.1)
Sodium: 138 mmol/L (ref 135–145)
Total Bilirubin: 0.7 mg/dL (ref 0.3–1.2)
Total Protein: 6.7 g/dL (ref 6.5–8.1)

## 2015-12-25 LAB — CBC
HCT: 37.3 % — ABNORMAL LOW (ref 40.0–52.0)
Hemoglobin: 12.8 g/dL — ABNORMAL LOW (ref 13.0–18.0)
MCH: 29.7 pg (ref 26.0–34.0)
MCHC: 34.3 g/dL (ref 32.0–36.0)
MCV: 86.4 fL (ref 80.0–100.0)
Platelets: 180 10*3/uL (ref 150–440)
RBC: 4.32 MIL/uL — ABNORMAL LOW (ref 4.40–5.90)
RDW: 14.3 % (ref 11.5–14.5)
WBC: 6.4 10*3/uL (ref 3.8–10.6)

## 2015-12-25 LAB — TROPONIN I
Troponin I: 0.07 ng/mL — ABNORMAL HIGH (ref <0.031)
Troponin I: 0.1 ng/mL — ABNORMAL HIGH (ref <0.031)

## 2015-12-25 LAB — GLUCOSE, CAPILLARY
Glucose-Capillary: 251 mg/dL — ABNORMAL HIGH (ref 65–99)
Glucose-Capillary: 267 mg/dL — ABNORMAL HIGH (ref 65–99)
Glucose-Capillary: 294 mg/dL — ABNORMAL HIGH (ref 65–99)
Glucose-Capillary: 194 mg/dL — ABNORMAL HIGH (ref 65–99)

## 2015-12-25 LAB — HEPARIN LEVEL (UNFRACTIONATED)
Heparin Unfractionated: 0.15 IU/mL — ABNORMAL LOW (ref 0.30–0.70)
Heparin Unfractionated: 0.32 IU/mL (ref 0.30–0.70)

## 2015-12-25 LAB — HEMOGLOBIN A1C: Hgb A1c MFr Bld: 10.4 % — ABNORMAL HIGH (ref 4.0–6.0)

## 2015-12-25 SURGERY — LEFT HEART CATH AND CORONARY ANGIOGRAPHY
Anesthesia: Moderate Sedation

## 2015-12-25 MED ORDER — HEPARIN (PORCINE) IN NACL 2-0.9 UNIT/ML-% IJ SOLN
INTRAMUSCULAR | Status: AC
  Filled 2015-12-25: qty 500

## 2015-12-25 MED ORDER — NITROGLYCERIN 5 MG/ML IV SOLN
INTRAVENOUS | Status: AC
  Filled 2015-12-25: qty 10

## 2015-12-25 MED ORDER — HEPARIN (PORCINE) IN NACL 2-0.9 UNIT/ML-% IJ SOLN
INTRAMUSCULAR | Status: AC
Start: 2015-12-25 — End: 2015-12-25
  Filled 2015-12-25: qty 500

## 2015-12-25 MED ORDER — MORPHINE SULFATE (PF) 2 MG/ML IV SOLN
INTRAVENOUS | Status: AC
  Filled 2015-12-25: qty 1

## 2015-12-25 MED ORDER — FENTANYL CITRATE (PF) 100 MCG/2ML IJ SOLN
INTRAMUSCULAR | Status: AC
  Filled 2015-12-25: qty 2

## 2015-12-25 MED ORDER — SODIUM CHLORIDE 0.9 % WEIGHT BASED INFUSION
3.0000 mL/kg/h | INTRAVENOUS | Status: DC
  Administered 2015-12-25: 3 mL/kg/h via INTRAVENOUS

## 2015-12-25 MED ORDER — MIDAZOLAM HCL 2 MG/2ML IJ SOLN
INTRAMUSCULAR | Status: AC
  Filled 2015-12-25: qty 2

## 2015-12-25 MED ORDER — CLOPIDOGREL BISULFATE 75 MG PO TABS
ORAL_TABLET | ORAL | Status: AC
  Filled 2015-12-25: qty 4

## 2015-12-25 MED ORDER — SODIUM CHLORIDE 0.9% FLUSH
3.0000 mL | Freq: Two times a day (BID) | INTRAVENOUS | Status: DC
  Administered 2015-12-25 – 2015-12-26 (×2): 3 mL via INTRAVENOUS

## 2015-12-25 MED ORDER — IOPAMIDOL (ISOVUE-300) INJECTION 61%
INTRAVENOUS | Status: DC | PRN
  Administered 2015-12-25: 370 mL via INTRA_ARTERIAL

## 2015-12-25 MED ORDER — ASPIRIN 81 MG PO CHEW
CHEWABLE_TABLET | ORAL | Status: DC | PRN
  Administered 2015-12-25: 324 mg via ORAL

## 2015-12-25 MED ORDER — CLOPIDOGREL BISULFATE 75 MG PO TABS
75.0000 mg | ORAL_TABLET | Freq: Every day | ORAL | Status: DC
  Administered 2015-12-26: 75 mg via ORAL
  Filled 2015-12-25: qty 1

## 2015-12-25 MED ORDER — ASPIRIN EC 325 MG PO TBEC
325.0000 mg | DELAYED_RELEASE_TABLET | Freq: Every day | ORAL | Status: DC
  Administered 2015-12-25 – 2015-12-26 (×2): 325 mg via ORAL
  Filled 2015-12-25 (×2): qty 1

## 2015-12-25 MED ORDER — BIVALIRUDIN 250 MG IV SOLR
INTRAVENOUS | Status: AC
  Filled 2015-12-25: qty 250

## 2015-12-25 MED ORDER — ASPIRIN 81 MG PO CHEW
CHEWABLE_TABLET | ORAL | Status: AC
  Filled 2015-12-25: qty 3

## 2015-12-25 MED ORDER — ACETAMINOPHEN 325 MG PO TABS: 650.0000 mg | ORAL_TABLET | ORAL | Status: DC | PRN

## 2015-12-25 MED ORDER — MIDAZOLAM HCL 2 MG/2ML IJ SOLN
INTRAMUSCULAR | Status: DC | PRN
  Administered 2015-12-25: 1 mg via INTRAVENOUS

## 2015-12-25 MED ORDER — SODIUM CHLORIDE 0.9% FLUSH: 3.0000 mL | INTRAVENOUS | Status: DC | PRN

## 2015-12-25 MED ORDER — CLOPIDOGREL BISULFATE 75 MG PO TABS
ORAL_TABLET | ORAL | Status: DC | PRN
  Administered 2015-12-25: 300 mg via ORAL

## 2015-12-25 MED ORDER — FENTANYL CITRATE (PF) 100 MCG/2ML IJ SOLN
INTRAMUSCULAR | Status: DC | PRN
  Administered 2015-12-25 (×2): 25 ug via INTRAVENOUS

## 2015-12-25 MED ORDER — SODIUM CHLORIDE 0.9 % IV SOLN
250.0000 mg | INTRAVENOUS | Status: DC | PRN
  Administered 2015-12-25: 1.75 mg/kg/h via INTRAVENOUS

## 2015-12-25 MED ORDER — SODIUM CHLORIDE 0.9 % IV SOLN: 250.0000 mL | INTRAVENOUS | Status: DC | PRN

## 2015-12-25 MED ORDER — HEPARIN BOLUS VIA INFUSION
2900.0000 [IU] | Freq: Once | INTRAVENOUS | Status: AC
  Administered 2015-12-25: 2900 [IU] via INTRAVENOUS
  Filled 2015-12-25: qty 2900

## 2015-12-25 MED ORDER — BIVALIRUDIN BOLUS VIA INFUSION - CUPID
INTRAVENOUS | Status: DC | PRN
  Administered 2015-12-25: 103.95 mg via INTRAVENOUS

## 2015-12-25 MED ORDER — FENTANYL CITRATE (PF) 100 MCG/2ML IJ SOLN
INTRAMUSCULAR | Status: DC | PRN
  Administered 2015-12-25: 50 ug via INTRAVENOUS

## 2015-12-25 MED ORDER — ONDANSETRON HCL 4 MG/2ML IJ SOLN: 4.0000 mg | Freq: Four times a day (QID) | INTRAMUSCULAR | Status: DC | PRN

## 2015-12-25 MED ORDER — EZETIMIBE 10 MG PO TABS
10.0000 mg | ORAL_TABLET | Freq: Every day | ORAL | Status: DC
  Administered 2015-12-25 – 2015-12-26 (×2): 10 mg via ORAL
  Filled 2015-12-25 (×2): qty 1

## 2015-12-25 MED ORDER — LIVING WELL WITH DIABETES BOOK
Freq: Once | Status: AC
  Administered 2015-12-26: 10:00:00
  Filled 2015-12-25: qty 1

## 2015-12-25 SURGICAL SUPPLY — 19 items
BALLN TREK RX 2.5X15 (BALLOONS) ×2
BALLN ~~LOC~~ TREK RX 2.75X8 (BALLOONS) ×2
BALLOON TREK RX 2.5X15 (BALLOONS) ×1 IMPLANT
BALLOON ~~LOC~~ TREK RX 2.75X8 (BALLOONS) ×1 IMPLANT
CATH INFINITI 5FR ANG PIGTAIL (CATHETERS) ×2 IMPLANT
CATH INFINITI 5FR JL4 (CATHETERS) ×2 IMPLANT
CATH INFINITI JR4 5F (CATHETERS) ×2 IMPLANT
CATH VISTA GUIDE 6FR XB3.5 SH (CATHETERS) ×2 IMPLANT
DEVICE CLOSURE MYNXGRIP 6/7F (Vascular Products) ×2 IMPLANT
DEVICE INFLAT 30 PLUS (MISCELLANEOUS) ×2 IMPLANT
KIT MANI 3VAL PERCEP (MISCELLANEOUS) ×2 IMPLANT
NEEDLE PERC 18GX7CM (NEEDLE) ×2 IMPLANT
NEEDLE SMART 18G ACCESS (NEEDLE) ×2 IMPLANT
PACK CARDIAC CATH (CUSTOM PROCEDURE TRAY) ×2 IMPLANT
SHEATH AVANTI 5FR X 11CM (SHEATH) ×2 IMPLANT
SHEATH AVANTI 6FR X 11CM (SHEATH) ×2 IMPLANT
STENT XIENCE ALPINE RX 2.5X15 (Permanent Stent) ×2 IMPLANT
WIRE EMERALD 3MM-J .035X150CM (WIRE) ×2 IMPLANT
WIRE G HI TQ BMW 190 (WIRE) ×2 IMPLANT

## 2015-12-25 NOTE — Progress Notes (Signed)
[facility] - [place] at [facility]                                                                                                                                                                                            Patient Demographics   Dmani Mizer, is a 65 y.o. male, DOB - [birth year], ZOX:[id number]  Admit date - 12/24/2015   Admitting Physician Enedina Finner, MD  Outpatient Primary MD for the patient is Kerby Nora, MD   LOS - 1  Subjective: Isn't seen post-catheter. He had a stent placed he denies any chest pains currently.     Review of Systems:   CONSTITUTIONAL: No documented fever. No fatigue, weakness. No weight gain, no weight loss.  EYES: No blurry or double vision.  ENT: No tinnitus. No postnasal drip. No redness of the oropharynx.  RESPIRATORY: No cough, no wheeze, no hemoptysis. No dyspnea.  CARDIOVASCULAR: Positive chest pain. No orthopnea. No palpitations. No syncope.  GASTROINTESTINAL: No nausea, no vomiting or diarrhea. No abdominal pain. No melena or hematochezia.  GENITOURINARY: No dysuria or hematuria.  ENDOCRINE: No polyuria or nocturia. No heat or cold intolerance.  HEMATOLOGY: No anemia. No bruising. No bleeding.  INTEGUMENTARY: No rashes. No lesions.  MUSCULOSKELETAL: No arthritis. No swelling. No gout.  NEUROLOGIC: No numbness, tingling, or ataxia. No seizure-type activity.  PSYCHIATRIC: No anxiety. No insomnia. No ADD.    Vitals:   Filed Vitals:   12/25/15 1310 12/25/15 1325 12/25/15 1351 12/25/15 1415  BP: 159/76 143/70 137/76 136/63  Pulse: 64 62 63 65  Temp:    97.6 F (36.4 C)  TempSrc:    Oral  Resp: Height:      Weight:      SpO2: 93% 94% 95% 94%    Wt Readings from Last 3 Encounters:  12/24/15 138.619 kg (305 lb 9.6 oz)  11/20/15 139.141 kg (306 lb 12 oz)  10/16/15 138.687 kg (305 lb 12 oz)     Intake/Output Summary (Last 24 hours) at 12/25/15 1454 Last data filed at 12/25/15 0731  Gross  per 24 hour  Intake 698.49 ml  Output    950 ml  Net -251.51 ml    Physical Exam:   GENERAL: Pleasant-appearing in no apparent distress.  HEAD, EYES, EARS, NOSE AND THROAT: Atraumatic, normocephalic. Extraocular muscles are intact. Pupils equal and reactive to light. Sclerae anicteric. No conjunctival injection. No oro-pharyngeal erythema.  NECK: Supple. There is no jugular venous distention. No bruits, no lymphadenopathy, no thyromegaly.  HEART: Regular rate and rhythm,. No murmurs, no rubs, no clicks.  LUNGS: Clear  to auscultation bilaterally. No rales or rhonchi. No wheezes.  ABDOMEN: Soft, flat, nontender, nondistended. Has good bowel sounds. No hepatosplenomegaly appreciated.  EXTREMITIES: Bilateral lower extremity swelling and erythema which appears to be chronic NEUROLOGIC: The patient is alert, awake, and oriented x3 with no focal motor or sensory deficits appreciated bilaterally.  SKIN: Moist and warm with no rashes appreciated.  Psych: Not anxious, depressed LN: No inguinal LN enlargement    Antibiotics   Anti-infectives    None      Medications   Scheduled Meds: . amLODipine  5 mg Oral Daily  . aspirin EC  325 mg Oral Daily  . aspirin EC  81 mg Oral Daily  . carvedilol  6.25 mg Oral BID WC  . [START ON 12/26/2015] clopidogrel  75 mg Oral Q breakfast  . insulin aspart  0-15 Units Subcutaneous TID WC  . insulin aspart protamine- aspart  80 Units Subcutaneous BID WC  . living well with diabetes book   Does not apply Once  . morphine      . sodium chloride flush  3 mL Intravenous Q12H  . triamterene-hydrochlorothiazide  1 tablet Oral Daily   Continuous Infusions: . sodium chloride    . heparin 1,600 Units/hr (12/25/15 0616)   PRN Meds:.sodium chloride, acetaminophen **OR** acetaminophen, acetaminophen, ALPRAZolam, hydrALAZINE, morphine injection, morphine injection, nitroGLYCERIN, ondansetron **OR** ondansetron (ZOFRAN) IV, ondansetron (ZOFRAN) IV, sodium chloride  flush   Data Review:   Micro Results No results found for this or any previous visit (from the past 240 hour(s)).  Radiology Reports Dg Chest 2 View  12/24/2015  CLINICAL DATA:  Chest pain for 4 days EXAM: CHEST  2 VIEW COMPARISON:  09/01/2009 FINDINGS: Cardiac shadow is at the upper limits of normal in size. The lungs are well aerated bilaterally. No focal infiltrate or sizable effusion is seen. No bony abnormality is noted. IMPRESSION: No active cardiopulmonary disease. Electronically Signed   By: Alcide CleverMark  Lukens M.D.   On: 12/24/2015 15:38     CBC  Recent Labs Lab 12/24/15 1432 12/25/15 0604  WBC 7.0 6.4  HGB 14.2 12.8*  HCT 41.8 37.3*  PLT 200 180  MCV 88.2 86.4  MCH 30.1 29.7  MCHC 34.1 34.3  RDW 14.4 14.3    Chemistries   Recent Labs Lab 12/24/15 1432 12/25/15 0604  NA 137 138  K 3.8 3.6  CL 99* 103  CO2 25 27  GLUCOSE 357* 269*  BUN 24* 23*  CREATININE 1.04 0.98  CALCIUM 9.4 8.7*  AST  --  22  ALT  --  31  ALKPHOS  --  59  BILITOT  --  0.7   ------------------------------------------------------------------------------------------------------------------ estimated creatinine clearance is 98.3 mL/min (by C-G formula based on Cr of 0.98). ------------------------------------------------------------------------------------------------------------------ No results for input(s): HGBA1C in the last 72 hours. ------------------------------------------------------------------------------------------------------------------  Recent Labs  12/25/15 0604  CHOL 231*  HDL 33*  LDLCALC 161*  TRIG 187*  CHOLHDL 7.0   ------------------------------------------------------------------------------------------------------------------ No results for input(s): TSH, T4TOTAL, T3FREE, THYROIDAB in the last 72 hours.  Invalid input(s): FREET3 ------------------------------------------------------------------------------------------------------------------ No results for  input(s): VITAMINB12, FOLATE, FERRITIN, TIBC, IRON, RETICCTPCT in the last 72 hours.  Coagulation profile  Recent Labs Lab 12/24/15 1432  INR 0.96    No results for input(s): DDIMER in the last 72 hours.  Cardiac Enzymes  Recent Labs Lab 12/24/15 1918 12/24/15 2350 12/25/15 0604  TROPONINI 0.12* 0.10* 0.07*   ------------------------------------------------------------------------------------------------------------------ Invalid input(s): POCBNP    Assessment & Plan   ohn  Panuco is a 65 y.o. male with a known history ofcoronary artery disease presenting with chest pain  1. Acute non-Q wave MI/unstable angina -Status post cardiac catheter with stent placement to the LAD Continue current therapy with aspirin and Plavix Continue Coreg  2. Accelerated hypertension -Continue hydrochlorothiazide/triamterene and  and Coreg and Norvasc  3. Morbid obesity   4. Type 2 diabetes  Scale insulin and home dose of insulin  5. Hypertriglyceridemia continue start cholesterol-lowering medication  6. DVT prophylaxis Place patient currently ambulatory    Start     Ordered   12/25/15 1345  Full code   Continuous     12/25/15 1344    Code Status History    Date Active Date Inactive Code Status Order ID Comments User Context   12/24/2015  6:14 PM 12/25/2015  1:44 PM Full Code [id number]  Enedina Finner, MD Inpatient           Consu cardiology   DVT ProphyM  patient is ambulatory Lab Results  Component Value Date   PLT 180 12/25/2015     Time Spent in minutes   35  Greater than 50% of time spent in care coordination and counseling patient regarding the condition and plan of care.   Auburn Bilberry M.D on 12/25/2015 at 2:54 PM  Between 7am to 6pm - Pager - [phone]  After 6pm go to [email or web address] - password [email or web address] [facility]  [facility]   Office  [phone]

## 2015-12-25 NOTE — Progress Notes (Signed)
Cardiac cath prelim:  Chronically occluded RCA in the proximal to mid region. Severe proximal LAD disease,  Unable to exclude moderate mid LAD disease after the takeoff of the diagonal branch. Normal EF >55%, no significant AS or MR  Recommendations:  Case discussed with Dr. Juliann Paresallwood,  He will attempt PCI of the LAD. High risk case given morbid obesity, challenging images, and occluded RCA.

## 2015-12-25 NOTE — Consult Note (Signed)
ANTICOAGULATION CONSULT NOTE - Initial Consult  Pharmacy Consult for heparin drip Indication: chest pain/ACS  Allergies  Allergen Reactions  . Atorvastatin     REACTION: Body aches    Patient Measurements: Height: 5\' 6"  (167.6 cm) Weight: (!) 305 lb 9.6 oz (138.619 kg) IBW/kg (Calculated) : 63.8 Heparin Dosing Weight: 93.7kg  Vital Signs: Temp: 98.7 F (37.1 C) (04/27 1936) Temp Source: Oral (04/27 1936) BP: 136/57 mmHg (04/27 1936) Pulse Rate: 92 (04/27 1936)  Labs:  Recent Labs  12/24/15 1432 12/24/15 1918 12/24/15 2350  HGB 14.2  --   --   HCT 41.8  --   --   PLT 200  --   --   APTT 28  --   --   LABPROT 13.0  --   --   INR 0.96  --   --   HEPARINUNFRC  --   --  0.15*  CREATININE 1.04  --   --   TROPONINI 0.08* 0.12*  --     Estimated Creatinine Clearance: 92.6 mL/min (by C-G formula based on Cr of 1.04).   Medical History: Past Medical History  Diagnosis Date  . Coronary artery disease, non-occlusive     a. cath 2002 with no sig CAD; b. cath 2008 normal LM, LAD, LCx, p&dRCA 20-30%, PDA 30%  . Diabetes mellitus type 2, insulin dependent (HCC)   . Hyperlipemia   . Hypertension   . Osteoarthritis   . Depression   . Kidney stones   . Morbid obesity (HCC)   . Back injury 02/2002    worker's comp    Medications:  Scheduled:  . amLODipine  5 mg Oral Daily  . aspirin  81 mg Oral Pre-Cath  . aspirin EC  81 mg Oral Daily  . carvedilol  6.25 mg Oral BID WC  . heparin  2,900 Units Intravenous Once  . insulin aspart  0-15 Units Subcutaneous TID WC  . insulin aspart protamine- aspart  80 Units Subcutaneous BID WC  . sodium chloride flush  3 mL Intravenous Q12H  . triamterene-hydrochlorothiazide  1 tablet Oral Daily    Assessment: Pt is a 65 year old male who presents with chest pain. Review of patient chart shows no home anticoagulants. Baseline labs have been ordered.    Goal of Therapy:  Heparin level 0.3-0.7 units/ml Monitor platelets by  anticoagulation protocol: Yes   Plan:  First heparin level subtherapeutic. 2900 units IV x 1 bolus and increase rate to 1600 units/hr. Will recheck level in 6 hours.  Carola FrostNathan A Chanse Kagel, Pharm.D., BCPS Clinical Pharmacist 12/25/2015,12:23 AM

## 2015-12-25 NOTE — Progress Notes (Signed)
Inpatient Diabetes Program Recommendations  AACE/ADA: New Consensus Statement on Inpatient Glycemic Control (2015)  Target Ranges:  Prepandial:   less than 140 mg/dL      Peak postprandial:   less than 180 mg/dL (1-2 hours)      Critically ill patients:  140 - 180 mg/dL   Review of Glycemic Control  Results for [name], [name] (MRN [id number]) as of 12/25/2015 11:34  Ref. Range 12/24/2015 18:15 12/24/2015 20:45 12/25/2015 07:37 12/25/2015 07:38  Glucose-Capillary Latest Ref Range: 65-99 mg/dL 409260 ([name]) 811273 ([name]) 914251 ([name]) 267 ([name])    Diabetes history: Type 2, A1C 10.1% on 10/19/15  Outpatient Diabetes medications: Novolin 70/30 80 units bid, Actos 45mg  qday ordered on 10/16/15 (not on current med list) Current orders for Inpatient glycemic control: Novolog mix 70/30  80 units bid, Novolog 0-15 mg tid  Inpatient Diabetes Program Recommendations: Consider increasing Novolog correction to resistant scale 0-20 units tid.  Consider adding Novolog correction 0-5 units qhs  MD note from 10/16/15 states patient did not tolerate Byetta, Levemir, Victoza.  Metformin caused diarrhea.    Susette RacerJulie Tanishi Nault, RN, BA, MHA, CDE Diabetes Coordinator Inpatient Diabetes Program  [phone] (Team Pager) [phone] [facility] Office) 12/25/2015 11:45 AM

## 2015-12-25 NOTE — Consult Note (Signed)
ANTICOAGULATION CONSULT NOTE - Initial Consult  Pharmacy Consult for heparin drip Indication: chest pain/ACS  Allergies  Allergen Reactions  . Atorvastatin     REACTION: Body aches    Patient Measurements: Height: 5\' 6"  (167.6 cm) Weight: (!) 305 lb 9.6 oz (138.619 kg) IBW/kg (Calculated) : 63.8 Heparin Dosing Weight: 93.7kg  Vital Signs: Temp: 97.6 F (36.4 C) (04/28 1415) Temp Source: Oral (04/28 1415) BP: 136/63 mmHg (04/28 1415) Pulse Rate: 65 (04/28 1415)  Labs:  Recent Labs  12/24/15 1432 12/24/15 1918 12/24/15 2350 12/25/15 0604 12/25/15 0703  HGB 14.2  --   --  12.8*  --   HCT 41.8  --   --  37.3*  --   PLT 200  --   --  180  --   APTT 28  --   --   --   --   LABPROT 13.0  --   --   --   --   INR 0.96  --   --   --   --   HEPARINUNFRC  --   --  0.15*  --  0.32  CREATININE 1.04  --   --  0.98  --   TROPONINI 0.08* 0.12* 0.10* 0.07*  --     Estimated Creatinine Clearance: 98.3 mL/min (by C-G formula based on Cr of 0.98).   Assessment: Pt is a 65 year old male who presents with chest pain. Review of patient chart shows no home anticoagulants. Baseline labs have been ordered.    Goal of Therapy:  Heparin level 0.3-0.7 units/ml Monitor platelets by anticoagulation protocol: Yes   Plan:  Current orders for heparin 1600 units/hr. Heparin level therapeutic this morning. Patient to cath lab. Per RN heparin discontinued in cath lab. No additional monitoring at this time  Garlon HatchetJody Wauneta Silveria, PharmD Clinical Pharmacist  12/25/2015,2:28 PM

## 2015-12-25 NOTE — Progress Notes (Signed)
Pt returns from cath lab status post stent placement. Right groin has bandage which is clean, dry and intact, no hematoma noted with palpation.  Pedal pulses are +1 bilaterally. Pt has no complaints of pain, SR on telemetry, VSS. I will continue to assess.

## 2015-12-26 ENCOUNTER — Inpatient Hospital Stay
Admit: 2015-12-26 | Discharge: 2015-12-26 | Disposition: A | Discharge status: Home / Self Care | Payer: PPO | Attending: Internal Medicine | Admitting: Internal Medicine

## 2015-12-26 ENCOUNTER — Inpatient Hospital Stay: Admit: 2015-12-26 | Payer: PPO

## 2015-12-26 DIAGNOSIS — I10 Essential (primary) hypertension: Secondary | ICD-10-CM | POA: Diagnosis not present

## 2015-12-26 DIAGNOSIS — E785 Hyperlipidemia, unspecified: Secondary | ICD-10-CM | POA: Diagnosis not present

## 2015-12-26 DIAGNOSIS — I214 Non-ST elevation (NSTEMI) myocardial infarction: Secondary | ICD-10-CM | POA: Diagnosis not present

## 2015-12-26 DIAGNOSIS — E119 Type 2 diabetes mellitus without complications: Secondary | ICD-10-CM | POA: Diagnosis not present

## 2015-12-26 LAB — GLUCOSE, CAPILLARY
Glucose-Capillary: 142 mg/dL — ABNORMAL HIGH (ref 65–99)
Glucose-Capillary: 74 mg/dL (ref 65–99)

## 2015-12-26 LAB — ECHOCARDIOGRAM COMPLETE
Height: 66 in
Weight: 4889.6 oz

## 2015-12-26 MED ORDER — INSULIN NPH ISOPHANE & REGULAR (70-30) 100 UNIT/ML ~~LOC~~ SUSP: SUBCUTANEOUS | Status: DC

## 2015-12-26 MED ORDER — ASPIRIN 325 MG PO TBEC: 325.0000 mg | DELAYED_RELEASE_TABLET | Freq: Every day | ORAL | Status: DC

## 2015-12-26 MED ORDER — NITROGLYCERIN 0.4 MG SL SUBL: 0.4000 mg | SUBLINGUAL_TABLET | SUBLINGUAL | Status: DC | PRN

## 2015-12-26 MED ORDER — AMLODIPINE BESYLATE 5 MG PO TABS: 5.0000 mg | ORAL_TABLET | Freq: Every day | ORAL | Status: DC

## 2015-12-26 MED ORDER — CARVEDILOL 6.25 MG PO TABS: 6.2500 mg | ORAL_TABLET | Freq: Two times a day (BID) | ORAL | Status: DC

## 2015-12-26 MED ORDER — CLOPIDOGREL BISULFATE 75 MG PO TABS: 75.0000 mg | ORAL_TABLET | Freq: Every day | ORAL | Status: DC

## 2015-12-26 MED ORDER — EZETIMIBE 10 MG PO TABS: 10.0000 mg | ORAL_TABLET | Freq: Every day | ORAL | Status: DC

## 2015-12-26 NOTE — Discharge Instructions (Signed)
Keep log of sugars at home No heavy lifting for 2 weeks

## 2015-12-26 NOTE — Progress Notes (Signed)
*  PRELIMINARY RESULTS* Echocardiogram 2D Echocardiogram has been performed.  [name] 12/26/2015, 10:06 AM

## 2015-12-26 NOTE — Discharge Summary (Signed)
[facility] - [place] at [facility]   PATIENT NAME: [name]    MR#:  [id number]  DATE OF BIRTH:  08/16/1951  DATE OF ADMISSION:  12/24/2015 ADMITTING PHYSICIAN: Enedina Finner, MD  DATE OF DISCHARGE: 12/26/15  PRIMARY CARE PHYSICIAN: Kerby Nora, MD    ADMISSION DIAGNOSIS:  Chest pain, unspecified chest pain type [R07.9]  DISCHARGE DIAGNOSIS:  Acute NQWMI s/p DES in proximal 95% stenosed LAD HL DM-2 HTN Morbid obesity  SECONDARY DIAGNOSIS:   Past Medical History  Diagnosis Date  . Coronary artery disease, non-occlusive     a. cath 2002 with no sig CAD; b. cath 2008 normal LM, LAD, LCx, p&dRCA 20-30%, PDA 30%  . Diabetes mellitus type 2, insulin dependent (HCC)   . Hyperlipemia   . Hypertension   . Osteoarthritis   . Depression   . Kidney stones   . Morbid obesity (HCC)   . Back injury 02/2002    worker's comp    HOSPITAL COURSE:   Nehme is a 65 y.o. male with a known history of coronary artery disease presenting with chest pain  1. Acute non-Q wave MI/unstable angina -Status post cardiac catheter with stent placement to the LAD Continue current therapy with aspirin and Plavix Continue Coreg, prn nitro, zetia  2. Accelerated hypertension -Continue hydrochlorothiazide/triamterene,Coreg and Norvasc  3. Morbid obesity   4. Type 2 diabetes  Scale insulin and home dose of insulin  5. Hypertriglyceridemia continue start cholesterol-lowering medication zetia Pt intolerant to statins      Overall stable D/c to home  CONSULTS OBTAINED:  Treatment Team:  Iran Ouch, MD Chilton Si, MD  DRUG ALLERGIES:   Allergies  Allergen Reactions  . Atorvastatin     REACTION: Body aches    DISCHARGE MEDICATIONS:   Current Discharge Medication List    START taking these medications   Details  amLODipine (NORVASC) 5 MG tablet Take 1 tablet (5 mg total) by mouth daily. Qty: 30 tablet, Refills: 2    aspirin EC 325 MG  EC tablet Take 1 tablet (325 mg total) by mouth daily. Qty: 30 tablet, Refills: 0    carvedilol (COREG) 6.25 MG tablet Take 1 tablet (6.25 mg total) by mouth 2 (two) times daily with a meal. Qty: 60 tablet, Refills: 2    clopidogrel (PLAVIX) 75 MG tablet Take 1 tablet (75 mg total) by mouth daily with breakfast. Qty: 30 tablet, Refills: 2    ezetimibe (ZETIA) 10 MG tablet Take 1 tablet (10 mg total) by mouth daily. Qty: 30 tablet, Refills: 2    nitroGLYCERIN (NITROSTAT) 0.4 MG SL tablet Place 1 tablet (0.4 mg total) under the tongue every 5 (five) minutes as needed for chest pain. Qty: 20 tablet, Refills: 2      CONTINUE these medications which have CHANGED   Details  insulin NPH-regular Human (NOVOLIN 70/30 RELION) (70-30) 100 UNIT/ML injection 80 units in the morning and 80 units at night. Qty: 40 mL, Refills: 5      CONTINUE these medications which have NOT CHANGED   Details  triamterene-hydrochlorothiazide (DYAZIDE) 50-25 MG capsule TAKE 1 CAPSULE BY MOUTH EVERY MORNING Qty: 30 capsule, Refills: 5        If you experience worsening of your admission symptoms, develop shortness of breath, life threatening emergency, suicidal or homicidal thoughts you must seek medical attention immediately by calling 911 or calling your MD immediately  if symptoms less severe.  You Must read complete instructions/literature along with all  the possible adverse reactions/side effects for all the Medicines you take and that have been prescribed to you. Take any new Medicines after you have completely understood and accept all the possible adverse reactions/side effects.   Please note  You were cared for by a hospitalist during your hospital stay. If you have any questions about your discharge medications or the care you received while you were in the hospital after you are discharged, you can call the unit and asked to speak with the hospitalist on call if the hospitalist that took care of you is  not available. Once you are discharged, your primary care physician will handle any further medical issues. Please note that NO REFILLS for any discharge medications will be authorized once you are discharged, as it is imperative that you return to your primary care physician (or establish a relationship with a primary care physician if you do not have one) for your aftercare needs so that they can reassess your need for medications and monitor your lab values. Today   SUBJECTIVE  Doing well  VITAL SIGNS:  Blood pressure 131/52, pulse 75, temperature 98.2 F (36.8 C), temperature source Oral, resp. rate 20, height  (1.676 m), weight 138.619 kg (305 lb 9.6 oz), SpO2 95 %.  I/O:   Intake/Output Summary (Last 24 hours) at 12/26/15 0746 Last data filed at 12/26/15 0523  Gross per 24 hour  Intake    568 ml  Output   1400 ml  Net   -832 ml    PHYSICAL EXAMINATION:  GENERAL:  65 y.o.-year-old patient lying in the bed with no acute distress. obese EYES: Pupils equal, round, reactive to light and accommodation. No scleral icterus. Extraocular muscles intact.  HEENT: Head atraumatic, normocephalic. Oropharynx and nasopharynx clear.  NECK:  Supple, no jugular venous distention. No thyroid enlargement, no tenderness.  LUNGS: Normal breath sounds bilaterally, no wheezing, rales,rhonchi or crepitation. No use of accessory muscles of respiration.  CARDIOVASCULAR: S1, S2 normal. No murmurs, rubs, or gallops.  ABDOMEN: Soft, non-tender, non-distended. Bowel sounds present. No organomegaly or mass.  EXTREMITIES: No pedal edema, cyanosis, or clubbing.  NEUROLOGIC: Cranial nerves II through XII are intact. Muscle strength 5/5 in all extremities. Sensation intact. Gait not checked.  PSYCHIATRIC: The patient is alert and oriented x 3.  SKIN: No obvious rash, lesion, or ulcer.   DATA REVIEW:   CBC   Recent Labs Lab 12/25/15 0604  WBC 6.4  HGB 12.8*  HCT 37.3*  PLT 180    Chemistries    Recent Labs Lab 12/25/15 0604  NA 138  K 3.6  CL 103  CO2 27  GLUCOSE 269*  BUN 23*  CREATININE 0.98  CALCIUM 8.7*  AST 22  ALT 31  ALKPHOS 59  BILITOT 0.7    Microbiology Results   No results found for this or any previous visit (from the past 240 hour(s)).  RADIOLOGY:  Dg Chest 2 View  12/24/2015  CLINICAL DATA:  Chest pain for 4 days EXAM: CHEST  2 VIEW COMPARISON:  09/01/2009 FINDINGS: Cardiac shadow is at the upper limits of normal in size. The lungs are well aerated bilaterally. No focal infiltrate or sizable effusion is seen. No bony abnormality is noted. IMPRESSION: No active cardiopulmonary disease. Electronically Signed   By: Alcide Clever M.D.   On: 12/24/2015 15:38     Management plans discussed with the patient, family and they are in agreement.  CODE STATUS:     Code Status Orders  Start     Ordered   12/25/15 1345  Full code   Continuous     12/25/15 1344    Code Status History    Date Active Date Inactive Code Status Order ID Comments User Context   12/24/2015  6:14 PM 12/25/2015  1:44 PM Full Code [id number]  Enedina FinnerSona Marnie Fazzino, MD Inpatient      TOTAL TIME TAKING CARE OF THIS PATIENT: 40 minutes.    Ryle Buscemi M.D on 12/26/2015 at 7:46 AM  Between 7am to 6pm - Pager - [phone] After 6pm go to [email or web address] - password [email or web address] [facility]  [place] [place] [facility]  Office  [phone]  CC: Primary care physician; Kerby NoraAmy Bedsole, MD

## 2015-12-26 NOTE — Progress Notes (Signed)
A & O. Ambulated in the hallway and tolerated it well. Pt reports no pain. Groin no bleeding or hematoma. NSR. Room air. Pt has no further concerns at this time. Wife to take home.

## 2015-12-28 ENCOUNTER — Telehealth: Payer: Self-pay | Admitting: *Deleted

## 2015-12-28 NOTE — Telephone Encounter (Signed)
Transition Care Management Follow-up Telephone Call   Date discharged? 12/26/15   How have you been since you were released from the hospital? Doing well, very tired with little energy.   Do you understand why you were in the hospital? yes   Do you understand the discharge instructions? yes   Where were you discharged to? home   Items Reviewed:  Medications reviewed: yes  Allergies reviewed: yes  Dietary changes reviewed: no  Referrals reviewed: no   Functional Questionnaire:   Activities of Daily Living (ADLs):   He states they are independent in the following: ambulation, bathing and hygiene, feeding, continence, grooming, toileting and dressing States they require assistance with the following: none   Any transportation issues/concerns?: no   Any patient concerns? no   Confirmed importance and date/time of follow-up visits scheduled yes, 53/17 @ 1315  Provider Appointment booked with Kerby NoraAmy Bedsole, MD  Confirmed with patient if condition begins to worsen call PCP or go to the ER.  Patient was given the office number and encouraged to call back with question or concerns.  : yes

## 2015-12-30 ENCOUNTER — Encounter: Payer: Self-pay | Admitting: Family Medicine

## 2015-12-30 ENCOUNTER — Ambulatory Visit (INDEPENDENT_AMBULATORY_CARE_PROVIDER_SITE_OTHER): Payer: PPO | Admitting: Family Medicine

## 2015-12-30 VITALS — BP 120/58 | HR 71 | Ht 66.0 in | Wt 306.4 lb

## 2015-12-30 DIAGNOSIS — E785 Hyperlipidemia, unspecified: Secondary | ICD-10-CM | POA: Diagnosis not present

## 2015-12-30 DIAGNOSIS — IMO0002 Reserved for concepts with insufficient information to code with codable children: Secondary | ICD-10-CM

## 2015-12-30 DIAGNOSIS — E1159 Type 2 diabetes mellitus with other circulatory complications: Secondary | ICD-10-CM | POA: Diagnosis not present

## 2015-12-30 DIAGNOSIS — I10 Essential (primary) hypertension: Secondary | ICD-10-CM

## 2015-12-30 DIAGNOSIS — Z794 Long term (current) use of insulin: Secondary | ICD-10-CM

## 2015-12-30 DIAGNOSIS — I214 Non-ST elevation (NSTEMI) myocardial infarction: Secondary | ICD-10-CM

## 2015-12-30 DIAGNOSIS — E1165 Type 2 diabetes mellitus with hyperglycemia: Secondary | ICD-10-CM

## 2015-12-30 DIAGNOSIS — Z5189 Encounter for other specified aftercare: Secondary | ICD-10-CM

## 2015-12-30 DIAGNOSIS — I2510 Atherosclerotic heart disease of native coronary artery without angina pectoris: Secondary | ICD-10-CM

## 2015-12-30 MED ORDER — LIRAGLUTIDE 18 MG/3ML ~~LOC~~ SOPN: 0.6000 mg | PEN_INJECTOR | Freq: Every day | SUBCUTANEOUS | Status: DC

## 2015-12-30 NOTE — Assessment & Plan Note (Signed)
Intolerant to statins. Now on zetia. Recheck in 3 months. Encouraged exercise, weight loss, healthy eating habits.

## 2015-12-30 NOTE — Progress Notes (Signed)
Pre visit review using our clinic review tool, if applicable. No additional management support is needed unless otherwise documented below in the visit note. 

## 2015-12-30 NOTE — Assessment & Plan Note (Signed)
On asa and plavix.  Needs risk factor control.

## 2015-12-30 NOTE — Assessment & Plan Note (Signed)
Continue insulin. SE to metformin in past. Start victoza 0.6  Daily. Follow up in 3-4 weeks as scheduled with home measurments.

## 2015-12-30 NOTE — Assessment & Plan Note (Signed)
Well controlled. Continue current medication.  

## 2015-12-30 NOTE — Patient Instructions (Addendum)
Call to make appointment with cardiology. Look into bariatric surgery.[facility]. [facility] [place] [facility] seminar. Now that legs feeling better.. Start walking or water exercise 3-5 times a weeks. Start victoza daily low dose.  Check blood sugar fasting in morning and bring measuremant to next OV. Continue current dose of inulin.

## 2015-12-30 NOTE — Progress Notes (Signed)
   Subjective:    Patient ID: William HuskJohn H [name], male    DOB: 04-May-1951, 65 y.o.   MRN: [id number]  HPI   65 year old male with CAD, poorly controlled DM, cholesterol presents following admission to [facility] for NSTEMI on 4/27. Discharge date to home 12/26/2015 Transitional care call made on  12/28/2015.  1. Acute non-Q wave MI/unstable angina Status post cardiac catheter with stent placement to the LAD by Dr. Mariah MillingGollan Continue current therapy with aspirin and Plavix Continue Coreg, prn nitro, zetia  2. Accelerated hypertension -Continue hydrochlorothiazide/triamterene,Coreg and Norvasc  3. Morbid obesity   4. Type 2 diabetes, poor control.. A1C was 10.4 Scale insulin and home dose of insulin.  5. Hypertriglyceridemia and hypercholesterolemia: start cholesterol-lowering medication zetia Pt intolerant to statin  LDL was 161 and tri 187  Today pt reports he continues to feel tired. He has been tolerating the zetia well BP well controlled.  No chest pain, feeling better overall.  Less shortness breath. BP Readings from Last 3 Encounters:  12/30/15 120/58  12/26/15 131/52  11/20/15 142/68   . Wt Readings from Last 3 Encounters:  12/30/15 306 lb 6.4 oz (138.982 kg)  12/24/15 305 lb 9.6 oz (138.619 kg)  11/20/15 306 lb 12 oz (139.141 kg)    Was told to make appt with cardiology.Marland Kitchen. Has not made yet.    Social History /Family History/Past Medical History reviewed and updated if needed.  Review of Systems  Constitutional: Negative for fever and fatigue.  HENT: Negative for ear pain.   Eyes: Negative for pain.  Respiratory: Negative for cough and shortness of breath.   Cardiovascular: Negative for chest pain, palpitations and leg swelling.  Gastrointestinal: Negative for abdominal pain.  Genitourinary: Negative for dysuria.  Musculoskeletal: Negative for arthralgias.  Neurological: Negative for syncope, light-headedness and headaches.  Psychiatric/Behavioral: Negative for dysphoric  mood.       Objective:   Physical Exam  Constitutional: Vital signs are normal. He appears well-developed and well-nourished.  Morbidly obese  HENT:  Head: Normocephalic.  Right Ear: Hearing normal.  Left Ear: Hearing normal.  Nose: Nose normal.  Mouth/Throat: Oropharynx is clear and moist and mucous membranes are normal.  Neck: Trachea normal. Carotid bruit is not present. No thyroid mass and no thyromegaly present.  Cardiovascular: Normal rate, regular rhythm and normal pulses.  Exam reveals no gallop, no distant heart sounds and no friction rub.   No murmur heard. No peripheral edema  Pulmonary/Chest: Effort normal and breath sounds normal. No respiratory distress.  Skin: Skin is warm, dry and intact. No rash noted.  Psychiatric: He has a normal mood and affect. His speech is normal and behavior is normal. Thought content normal.          Assessment & Plan:

## 2016-01-11 ENCOUNTER — Other Ambulatory Visit: Payer: Self-pay | Admitting: Family Medicine

## 2016-01-12 ENCOUNTER — Encounter (INDEPENDENT_AMBULATORY_CARE_PROVIDER_SITE_OTHER): Payer: Self-pay

## 2016-01-12 ENCOUNTER — Encounter: Payer: Self-pay | Admitting: Nurse Practitioner

## 2016-01-12 ENCOUNTER — Ambulatory Visit (INDEPENDENT_AMBULATORY_CARE_PROVIDER_SITE_OTHER): Payer: PPO | Admitting: Nurse Practitioner

## 2016-01-12 VITALS — BP 120/60 | HR 73 | Ht 67.0 in | Wt 308.2 lb

## 2016-01-12 DIAGNOSIS — E1139 Type 2 diabetes mellitus with other diabetic ophthalmic complication: Secondary | ICD-10-CM | POA: Insufficient documentation

## 2016-01-12 DIAGNOSIS — E11319 Type 2 diabetes mellitus with unspecified diabetic retinopathy without macular edema: Secondary | ICD-10-CM | POA: Insufficient documentation

## 2016-01-12 DIAGNOSIS — E1339 Other specified diabetes mellitus with other diabetic ophthalmic complication: Secondary | ICD-10-CM | POA: Insufficient documentation

## 2016-01-12 DIAGNOSIS — I10 Essential (primary) hypertension: Secondary | ICD-10-CM

## 2016-01-12 DIAGNOSIS — Z794 Long term (current) use of insulin: Secondary | ICD-10-CM

## 2016-01-12 DIAGNOSIS — I110 Hypertensive heart disease with heart failure: Secondary | ICD-10-CM | POA: Insufficient documentation

## 2016-01-12 DIAGNOSIS — K219 Gastro-esophageal reflux disease without esophagitis: Secondary | ICD-10-CM

## 2016-01-12 DIAGNOSIS — I2510 Atherosclerotic heart disease of native coronary artery without angina pectoris: Secondary | ICD-10-CM

## 2016-01-12 DIAGNOSIS — I119 Hypertensive heart disease without heart failure: Secondary | ICD-10-CM

## 2016-01-12 DIAGNOSIS — R0683 Snoring: Secondary | ICD-10-CM

## 2016-01-12 DIAGNOSIS — I214 Non-ST elevation (NSTEMI) myocardial infarction: Secondary | ICD-10-CM | POA: Diagnosis not present

## 2016-01-12 DIAGNOSIS — I25118 Atherosclerotic heart disease of native coronary artery with other forms of angina pectoris: Secondary | ICD-10-CM | POA: Insufficient documentation

## 2016-01-12 DIAGNOSIS — E785 Hyperlipidemia, unspecified: Secondary | ICD-10-CM | POA: Insufficient documentation

## 2016-01-12 DIAGNOSIS — E1365 Other specified diabetes mellitus with hyperglycemia: Secondary | ICD-10-CM

## 2016-01-12 DIAGNOSIS — E119 Type 2 diabetes mellitus without complications: Secondary | ICD-10-CM

## 2016-01-12 MED ORDER — CLOPIDOGREL BISULFATE 75 MG PO TABS: 75.0000 mg | ORAL_TABLET | Freq: Every day | ORAL | Status: DC

## 2016-01-12 MED ORDER — CARVEDILOL 6.25 MG PO TABS: 6.2500 mg | ORAL_TABLET | Freq: Two times a day (BID) | ORAL | Status: DC

## 2016-01-12 MED ORDER — EZETIMIBE 10 MG PO TABS: 10.0000 mg | ORAL_TABLET | Freq: Every day | ORAL | Status: DC

## 2016-01-12 MED ORDER — AMLODIPINE BESYLATE 5 MG PO TABS: 5.0000 mg | ORAL_TABLET | Freq: Every day | ORAL | Status: DC

## 2016-01-12 MED ORDER — ASPIRIN 81 MG PO TBEC: 81.0000 mg | DELAYED_RELEASE_TABLET | Freq: Every day | ORAL | Status: DC

## 2016-01-12 NOTE — Patient Instructions (Signed)
Medication Instructions:  Your physician has recommended you make the following change in your medication:  REDUCE Aspirin to 81mg  daily.  Refills have been sent to your pharmacy for your cardiac medications  Labwork: Your physician recommends that you return for a FASTING lipid profile and lft in 6 weeks  Testing/Procedures: None ordered  Follow-Up: Your physician recommends that you schedule a follow-up appointment in: 3 months with Dr.Gollan   Any Other Special Instructions Will Be Listed Below (If Applicable).     If you need a refill on your cardiac medications before your next appointment, please call your pharmacy.

## 2016-01-12 NOTE — Progress Notes (Signed)
Office Visit    Patient Name: [name] Date of Encounter: 01/12/2016  Primary Care Provider:  Kerby Nora, MD Primary Cardiologist:  Concha Se, MD   Chief Complaint     65 year old male status post recent non-ST elevation MI and stenting of the LAD who presents for follow-up.  Past Medical History    Past Medical History  Diagnosis Date  . Coronary artery disease, non-occlusive     a. cath 2002 with no sig CAD;  b. cath 2008 normal LM, LAD, LCx, p&dRCA 20-30%, PDA 30%; c.11/2015 NSTEMI/PCI: LM nl, LAD 95p (2.5x15 Xience DES), LCX nl, RCA 100p/m w/ L->R collats, EF 55-65%.  . Diabetes mellitus type 2, insulin dependent (HCC)   . Hyperlipemia   . Hypertensive heart disease   . Osteoarthritis   . Depression   . Kidney stones   . Morbid obesity (HCC)   . Back injury 02/2002    worker's comp  . Snoring    Past Surgical History  Procedure Laterality Date  . Cardiac catheterization  09/2000    diffuse LAD 30% LCA  EF 50-60%  . Cardiac catheterization  06/2007    no significant CAD  . Cardiac catheterization N/A 12/25/2015    Procedure: Left Heart Cath and Coronary Angiography;  Surgeon: Antonieta Iba, MD;  Location: [facility] INVASIVE CV LAB;  Service: Cardiovascular;  Laterality: N/A;  . Cardiac catheterization N/A 12/25/2015    Procedure: Coronary Stent Intervention;  Surgeon: Alwyn Pea, MD;  Location: [facility] INVASIVE CV LAB;  Service: Cardiovascular;  Laterality: N/A;    Allergies  Allergies  Allergen Reactions  . Atorvastatin     REACTION: Body aches    History of Present Illness    65 year old male with prior history of coronary artery disease, hypertension, hyperlipidemia, type 2 diabetes mellitus, and morbid obesity. He has a cardiac history dating back to 2002 at which time, he underwent diagnostic catheterization revealing no significant CAD. Since gone catheterization 2008 again showed nonobstructive CAD. He was admitted to [facility] in late  April 2017 with a two-month history of progressive exertional chest discomfort. He ruled in for non-STEMI and subsequent underwent catheterization revealing a chronic total occlusion of the right coronary artery with severe proximal LAD stenosis. The LAD was felt to be the culprit vessel and this was successfully stented using a drug-eluting stent.  Since his discharge, he has done reasonably well. He has noticed that evening on full strength aspirin and Plavix, he has had intermittent indigestion following meals. This often is relieved by drinking milk. He has not had any angina or dyspnea. Further, he denies PND, orthopnea, dizziness, syncope, edema, or early satiety. His right groin has healed up well. He has been taking it easy since discharge and is looking forward to getting back to work.  Home Medications    Prior to Admission medications   Medication Sig Start Date End Date Taking? Authorizing Provider  amLODipine (NORVASC) 5 MG tablet Take 1 tablet (5 mg total) by mouth daily. 01/12/16  Yes Ok Anis, NP  aspirin 81 MG EC tablet Take 1 tablet (81 mg total) by mouth daily. 01/12/16  Yes Ok Anis, NP  carvedilol (COREG) 6.25 MG tablet Take 1 tablet (6.25 mg total) by mouth 2 (two) times daily with a meal. 01/12/16  Yes Ok Anis, NP  clopidogrel (PLAVIX) 75 MG tablet Take 1 tablet (75 mg total) by mouth daily with breakfast. 01/12/16  Yes Ok Anis,  NP  diclofenac (VOLTAREN) 75 MG EC tablet TAKE ONE TABLET BY MOUTH TWICE DAILY 01/12/16  Yes Amy E Bedsole, MD  ezetimibe (ZETIA) 10 MG tablet Take 1 tablet (10 mg total) by mouth daily. 01/12/16  Yes Ok Anis, NP  insulin NPH-regular Human (NOVOLIN 70/30 RELION) (70-30) 100 UNIT/ML injection 80 units in the morning and 80 units at night. 12/26/15  Yes Enedina Finner, MD  nitroGLYCERIN (NITROSTAT) 0.4 MG SL tablet Place 1 tablet (0.4 mg total) under the tongue every 5 (five) minutes as needed for chest pain.  12/26/15  Yes Enedina Finner, MD  triamterene-hydrochlorothiazide (DYAZIDE) 50-25 MG capsule TAKE 1 CAPSULE BY MOUTH EVERY MORNING 11/05/15  Yes Amy Michelle Nasuti, MD    Review of Systems    He has been having some postprandial indigestion. He denies chest pain, palpitations, dyspnea, pnd, orthopnea, n, v, dizziness, syncope, edema, weight gain, or early satiety.  All other systems reviewed and are otherwise negative except as noted above.  Physical Exam    VS:  BP 120/60 mmHg  Pulse 73  Ht 5\' 7"  (1.702 m)  Wt 308 lb 4 oz (139.821 kg)  BMI 48.27 kg/m2 , BMI Body mass index is 48.27 kg/(m^2). GEN: Morbidly obese Caucasian male in no acute distress. HEENT: normal. Neck: Supple, obese, difficult to assess JVP. No bruits. Cardiac: RRR, no murmurs, rubs, or gallops. No clubbing, cyanosis, edema.  Radials/DP/PT 2+ and equal bilaterally.  Right groin catheterization site without bleeding, bruit, or hematoma. Respiratory:  Respirations regular and unlabored, clear to auscultation bilaterally. GI:  Obese,Soft, nontender, nondistended, BS + x 4. MS: no deformity or atrophy. Skin: warm and dry, no rash. Neuro:  Strength and sensation are intact. Psych: Normal affect.  Accessory Clinical Findings    ECG - Regular sinus rhythm, 73, PAC, leftward axis, first-degree AV block, no acute ST or T changes.  Assessment & Plan    1.  Non-ST segment elevation myocardial infarction, subsequent episode of care/coronary artery disease: Status post recent admission and catheterization revealing severe proximal LAD disease and chronic total occlusion of the right coronary artery. The LAD was successfully treated with a drug-eluting stent. Since discharge, he has done well without chest pain or dyspnea. He is not planning on participating in cardiac rehabilitation as he says his wife is sick and he needs to take care of her. He has been having some indigestion and I note that he is currently taking 325 of aspirin and also  Plavix. I've asked him to reduce his aspirin dose to 81 mg daily. I further advised that he can use over-the-counter Pepcid or we would be happy to prescribe Protonix if he continues to have indigestion symptoms on the lower dose of aspirin. I have also noted that he takes Voltaren for bilateral knee pain. He has been on this for a long time and is currently taking 75 mg twice a day. In the setting of concomitant Plavix usage, this dose should be limited to 50 mg twice a day. Continue beta blocker and Zetia. He is producing intolerant to statins.  2. Hypertensive heart disease: Blood pressure was elevated during his hospitalization but is currently stable on beta blocker, calcium channel blocker, and triamterene-HCTZ. Given history of diabetes, will need to consider ACE inhibitor in the future.  3. Hyperlipidemia: LDL was 161 on April 28. He is intolerant to statins secondary to myalgias. Continue Zetia. Follow-up lipids and LFTs in 6 weeks.  4. Type 2 diabetes mellitus: He uses insulin  and is followed closely by primary care. His A1c in April was 10.4.  5. Morbid obesity: We discussed the importance of calorie restriction and increase activity with a goal of weight loss. I also discussed the role of cardiac rehabilitation however he does not feel as though that will fit into his lifestyle.  6. Snoring: Patient reports that he snores at night. I recommended referral to pulmonology for sleep study however at this time, he is not interested in pursuing this.  7. GERD:  Increased post-prandial Ss since hospitalization - likely attributable to full dose ASA, Plavix, and voltaren 75 bid.  Reduce ASA to 81 mg daily.  I also offered to Rx protonix.  He'd like to see if Ss improve on a lower dose of ASA and if not, he will try OTC pepcid.  I told him to avoid prilosec (omeprazone) OTC as it could interfere w/ Plavix.  8.  Disposition: Follow-up lipids and LFTs in 6 weeks. Follow-up with Dr. Mariah MillingGollan in 3  months.   Nicolasa Duckinghristopher [name], NP 01/12/2016, 8:39 AM

## 2016-01-19 DIAGNOSIS — E113393 Type 2 diabetes mellitus with moderate nonproliferative diabetic retinopathy without macular edema, bilateral: Secondary | ICD-10-CM | POA: Diagnosis not present

## 2016-01-19 DIAGNOSIS — H348132 Central retinal vein occlusion, bilateral, stable: Secondary | ICD-10-CM | POA: Diagnosis not present

## 2016-01-19 DIAGNOSIS — H3582 Retinal ischemia: Secondary | ICD-10-CM | POA: Diagnosis not present

## 2016-01-19 DIAGNOSIS — H3563 Retinal hemorrhage, bilateral: Secondary | ICD-10-CM | POA: Diagnosis not present

## 2016-02-05 ENCOUNTER — Telehealth: Payer: Self-pay

## 2016-02-05 NOTE — Telephone Encounter (Signed)
Mrs [name] left v/m(DPR signed) that pt was put on Lopressor after heart attack and pt is constipated; no BM in 7 days;pts stomach is swollen and feels bloated;  pt is taking stool softeners with no results.pt took magnesium citrate and had very small constipated stool three days ago. Pt feels miserable but no abd pain. Mrs [name] wants Lopressor changed to different med. [name] is not home now but when he gets home Mrs [name] will take pt to the [facility] near where pt lives. Mrs [name] will contact cardiologist about changing Lopressor to different med.  FYI to Dr Ermalene SearingBedsole.

## 2016-02-09 ENCOUNTER — Other Ambulatory Visit: Payer: Self-pay | Admitting: Family Medicine

## 2016-02-09 NOTE — Telephone Encounter (Signed)
Last office visit 12/30/2015.  Last refilled 01/12/2016 for #60 with no refills.  Ok to refill?

## 2016-02-16 ENCOUNTER — Other Ambulatory Visit (INDEPENDENT_AMBULATORY_CARE_PROVIDER_SITE_OTHER): Payer: PPO

## 2016-02-16 DIAGNOSIS — E785 Hyperlipidemia, unspecified: Secondary | ICD-10-CM

## 2016-02-16 DIAGNOSIS — Z794 Long term (current) use of insulin: Secondary | ICD-10-CM | POA: Diagnosis not present

## 2016-02-16 DIAGNOSIS — E119 Type 2 diabetes mellitus without complications: Secondary | ICD-10-CM | POA: Diagnosis not present

## 2016-02-16 LAB — HEPATIC FUNCTION PANEL
ALT: 23 U/L (ref 0–53)
AST: 16 U/L (ref 0–37)
Albumin: 4 g/dL (ref 3.5–5.2)
Alkaline Phosphatase: 73 U/L (ref 39–117)
Bilirubin, Direct: 0.1 mg/dL (ref 0.0–0.3)
Total Bilirubin: 0.4 mg/dL (ref 0.2–1.2)
Total Protein: 7.2 g/dL (ref 6.0–8.3)

## 2016-02-16 LAB — BASIC METABOLIC PANEL
BUN: 20 mg/dL (ref 6–23)
CO2: 33 mEq/L — ABNORMAL HIGH (ref 19–32)
Calcium: 9.6 mg/dL (ref 8.4–10.5)
Chloride: 103 mEq/L (ref 96–112)
Creatinine, Ser: 1.1 mg/dL (ref 0.40–1.50)
GFR: 71.12 mL/min (ref >60.00)
Glucose, Bld: 178 mg/dL — ABNORMAL HIGH (ref 70–99)
Potassium: 5 mEq/L (ref 3.5–5.1)
Sodium: 141 mEq/L (ref 135–145)

## 2016-02-16 LAB — HEMOGLOBIN A1C: Hgb A1c MFr Bld: 8.1 % — ABNORMAL HIGH (ref 4.6–6.5)

## 2016-02-16 LAB — MICROALBUMIN / CREATININE URINE RATIO
Creatinine,U: 155.1 mg/dL
Microalb Creat Ratio: 0.5 mg/g (ref 0.0–30.0)
Microalb, Ur: 0.8 mg/dL (ref 0.0–1.9)

## 2016-02-16 LAB — LIPID PANEL
Cholesterol: 197 mg/dL (ref 0–200)
HDL: 33.2 mg/dL — ABNORMAL LOW (ref >39.00)
LDL Cholesterol: 139 mg/dL — ABNORMAL HIGH (ref 0–99)
NonHDL: 163.46
Total CHOL/HDL Ratio: 6
Triglycerides: 122 mg/dL (ref 0.0–149.0)
VLDL: 24.4 mg/dL (ref 0.0–40.0)

## 2016-02-19 ENCOUNTER — Telehealth: Payer: Self-pay | Admitting: Family Medicine

## 2016-02-19 ENCOUNTER — Other Ambulatory Visit: Payer: PPO

## 2016-02-19 NOTE — Telephone Encounter (Signed)
-----   Message from Natasha C Chavers sent at 02/09/2016  1:37 PM EDT ----- Regarding: Dm f/u labs Fri 6/23, need orders. Thanks! :-) Please order future dm f/u labs for pt's upcoming lab appt. Thanks Tasha   

## 2016-02-24 ENCOUNTER — Other Ambulatory Visit: Payer: PPO

## 2016-02-26 ENCOUNTER — Encounter: Payer: Self-pay | Admitting: Family Medicine

## 2016-02-26 ENCOUNTER — Ambulatory Visit (INDEPENDENT_AMBULATORY_CARE_PROVIDER_SITE_OTHER): Payer: PPO | Admitting: Family Medicine

## 2016-02-26 VITALS — BP 130/60 | HR 80 | Temp 98.3°F | Ht 66.0 in | Wt 304.8 lb

## 2016-02-26 DIAGNOSIS — I10 Essential (primary) hypertension: Secondary | ICD-10-CM | POA: Diagnosis not present

## 2016-02-26 DIAGNOSIS — I2510 Atherosclerotic heart disease of native coronary artery without angina pectoris: Secondary | ICD-10-CM | POA: Diagnosis not present

## 2016-02-26 DIAGNOSIS — Z794 Long term (current) use of insulin: Secondary | ICD-10-CM

## 2016-02-26 DIAGNOSIS — E119 Type 2 diabetes mellitus without complications: Secondary | ICD-10-CM

## 2016-02-26 DIAGNOSIS — E785 Hyperlipidemia, unspecified: Secondary | ICD-10-CM

## 2016-02-26 MED ORDER — EXENATIDE 5 MCG/0.02ML ~~LOC~~ SOPN: 5.0000 ug | PEN_INJECTOR | Freq: Two times a day (BID) | SUBCUTANEOUS | Status: DC

## 2016-02-26 NOTE — Progress Notes (Signed)
65 year old male with CAD, poorly controlled DM, cholesterol with recent NSTEMI on 4/27 presents for DM follow up.  1. Acute non-Q wave MI/unstable angina Status post cardiac catheter with stent placement to the LAD by Dr. Mariah MillingGollan Continue current therapy with aspirin and Plavix Continue Coreg, prn nitro, zetia  2. Accelerated hypertension, BP now at goal on current meds. -On hydrochlorothiazide/triamterene,Coreg and Norvasc BP Readings from Last 3 Encounters:  02/26/16 130/60  01/12/16 120/60  12/30/15 120/58  Using medication without problems or lightheadedness:  Chest pain with exertion:None Edema:None Short of breath:None Average home BPs: Other issues: Still has decreased energy. Trying to move around more than previously.   3.  Type 2 diabetes, poor control.. A1C was 10.4 Lab Results  Component Value Date   HGBA1C 8.1* 02/16/2016  Improved control with lifestyle change... Could not afford victoza.  On 80 / 75 Using medications without difficulties: Hypoglycemic episodes:None Hyperglycemic episodes:None Feet problems: None Blood Sugars averaging: FBS 90-138 eye exam within last year: yes  Wt Readings from Last 3 Encounters:  02/26/16 304 lb 12 oz (138.234 kg)  01/12/16 308 lb 4 oz (139.821 kg)  12/30/15 306 lb 6.4 oz (138.982 kg)   Body mass index is 49.21 kg/(m^2).   4. Hypertriglyceridemia and hypercholesterolemia:  Pt intolerant to statin  LDL was 161 and tri 187 Now on zetia. LD improved but not yet at goal. Lab Results  Component Value Date   CHOL 197 02/16/2016   HDL 33.20* 02/16/2016   LDLCALC 139* 02/16/2016   LDLDIRECT 186.9 05/31/2013   TRIG 122.0 02/16/2016   CHOLHDL 6 02/16/2016     Social History /Family History/Past Medical History reviewed and updated if needed.  Review of Systems  Constitutional: Negative for fever and fatigue.  HENT: Negative for ear pain.  Eyes: Negative for pain.  Respiratory: Negative for cough and shortness of  breath.  Cardiovascular: Negative for chest pain, palpitations and leg swelling.  Gastrointestinal: Negative for abdominal pain.  Genitourinary: Negative for dysuria.  Musculoskeletal: Negative for arthralgias.  Neurological: Negative for syncope, light-headedness and headaches.  Psychiatric/Behavioral: Negative for dysphoric mood.       Objective:   Physical Exam  Constitutional: Vital signs are normal. He appears well-developed and well-nourished.  Morbidly obese  HENT:  Head: Normocephalic.  Right Ear: Hearing normal.  Left Ear: Hearing normal.  Nose: Nose normal.  Mouth/Throat: Oropharynx is clear and moist and mucous membranes are normal.  Neck: Trachea normal. Carotid bruit is not present. No thyroid mass and no thyromegaly present.  Cardiovascular: Normal rate, regular rhythm and normal pulses. Exam reveals no gallop, no distant heart sounds and no friction rub.  No murmur heard. No peripheral edema  Pulmonary/Chest: Effort normal and breath sounds normal. No respiratory distress.  Skin: Skin is warm, dry and intact. No rash noted.  Psychiatric: He has a normal mood and affect. His speech is normal and behavior is normal. Thought content normal.       Diabetic foot exam: Normal inspection No skin breakdown No calluses  Normal DP pulses Normal sensation to light touch and monofilament Nails normal

## 2016-02-26 NOTE — Patient Instructions (Addendum)
Start byetta twice daily  Keep up great work on healthy eating! Keep moving more, try swimming!

## 2016-02-26 NOTE — Progress Notes (Signed)
Pre visit review using our clinic review tool, if applicable. No additional management support is needed unless otherwise documented below in the visit note. 

## 2016-02-26 NOTE — Assessment & Plan Note (Signed)
Has follow up with cardiology in 03/2016. Continue current med.. Plavix, bblocker

## 2016-02-26 NOTE — Assessment & Plan Note (Signed)
Well controlled. Continue current medication.  

## 2016-02-26 NOTE — Assessment & Plan Note (Signed)
Improved control with zetia but far from goal. Intolerant of statins. Encouraged exercise, weight loss, healthy eating habits.

## 2016-02-26 NOTE — Assessment & Plan Note (Signed)
Much improved with aggressive lifestyle changes. Could not afford victoza... Will try byetta for weight loss and sugar control.

## 2016-03-04 ENCOUNTER — Other Ambulatory Visit: Payer: Self-pay | Admitting: *Deleted

## 2016-03-04 MED ORDER — TRIAMTERENE-HCTZ 37.5-25 MG PO TABS: 1.0000 | ORAL_TABLET | Freq: Every day | ORAL | Status: DC

## 2016-03-04 NOTE — Telephone Encounter (Signed)
Received fax from [facility] [address] stating that the triamterene 50-25 mg capsules are no longer available.  Spoke with pharmacist who states only the tablet form in available.  Tablets only come in 37.5-25 mg or 75-50 mg.  Per Dr. Ermalene SearingBedsole will try patient on the 37.5-25 mg tablet.  [facility] Rx sent into [facility].  [name] notified of change in medication and advised to call our office if his blood pressure starts running high or if he starts having increase swelling on new dose of medcation.  Patient states understanding.

## 2016-03-09 ENCOUNTER — Emergency Department: Payer: PPO

## 2016-03-09 ENCOUNTER — Encounter: Payer: Self-pay | Admitting: Emergency Medicine

## 2016-03-09 ENCOUNTER — Inpatient Hospital Stay (HOSPITAL_BASED_OUTPATIENT_CLINIC_OR_DEPARTMENT_OTHER)
Admit: 2016-03-09 | Discharge: 2016-03-09 | Disposition: A | Discharge status: Home / Self Care | Payer: PPO | Attending: Physician Assistant | Admitting: Physician Assistant

## 2016-03-09 ENCOUNTER — Inpatient Hospital Stay
Admission: EM | Admit: 2016-03-09 | Discharge: 2016-03-11 | DRG: 281 | Disposition: A | Discharge status: Home / Self Care | Payer: PPO | Attending: Internal Medicine | Admitting: Internal Medicine

## 2016-03-09 DIAGNOSIS — R0789 Other chest pain: Secondary | ICD-10-CM | POA: Diagnosis not present

## 2016-03-09 DIAGNOSIS — M7989 Other specified soft tissue disorders: Secondary | ICD-10-CM | POA: Diagnosis present

## 2016-03-09 DIAGNOSIS — I10 Essential (primary) hypertension: Secondary | ICD-10-CM | POA: Diagnosis not present

## 2016-03-09 DIAGNOSIS — Z955 Presence of coronary angioplasty implant and graft: Secondary | ICD-10-CM | POA: Diagnosis not present

## 2016-03-09 DIAGNOSIS — R079 Chest pain, unspecified: Secondary | ICD-10-CM

## 2016-03-09 DIAGNOSIS — D649 Anemia, unspecified: Secondary | ICD-10-CM | POA: Diagnosis not present

## 2016-03-09 DIAGNOSIS — E113399 Type 2 diabetes mellitus with moderate nonproliferative diabetic retinopathy without macular edema, unspecified eye: Secondary | ICD-10-CM

## 2016-03-09 DIAGNOSIS — R0602 Shortness of breath: Secondary | ICD-10-CM | POA: Diagnosis not present

## 2016-03-09 DIAGNOSIS — I25118 Atherosclerotic heart disease of native coronary artery with other forms of angina pectoris: Secondary | ICD-10-CM

## 2016-03-09 DIAGNOSIS — I25110 Atherosclerotic heart disease of native coronary artery with unstable angina pectoris: Secondary | ICD-10-CM | POA: Diagnosis present

## 2016-03-09 DIAGNOSIS — E11319 Type 2 diabetes mellitus with unspecified diabetic retinopathy without macular edema: Secondary | ICD-10-CM | POA: Diagnosis not present

## 2016-03-09 DIAGNOSIS — Z79899 Other long term (current) drug therapy: Secondary | ICD-10-CM | POA: Diagnosis not present

## 2016-03-09 DIAGNOSIS — I5032 Chronic diastolic (congestive) heart failure: Secondary | ICD-10-CM | POA: Diagnosis present

## 2016-03-09 DIAGNOSIS — E1165 Type 2 diabetes mellitus with hyperglycemia: Secondary | ICD-10-CM | POA: Diagnosis not present

## 2016-03-09 DIAGNOSIS — Z794 Long term (current) use of insulin: Secondary | ICD-10-CM | POA: Diagnosis not present

## 2016-03-09 DIAGNOSIS — Z6841 Body Mass Index (BMI) 40.0 and over, adult: Secondary | ICD-10-CM | POA: Diagnosis not present

## 2016-03-09 DIAGNOSIS — I252 Old myocardial infarction: Secondary | ICD-10-CM | POA: Diagnosis not present

## 2016-03-09 DIAGNOSIS — F329 Major depressive disorder, single episode, unspecified: Secondary | ICD-10-CM | POA: Diagnosis present

## 2016-03-09 DIAGNOSIS — Z889 Allergy status to unspecified drugs, medicaments and biological substances status: Secondary | ICD-10-CM

## 2016-03-09 DIAGNOSIS — R9430 Abnormal result of cardiovascular function study, unspecified: Secondary | ICD-10-CM

## 2016-03-09 DIAGNOSIS — H5441 Blindness, right eye, normal vision left eye: Secondary | ICD-10-CM | POA: Diagnosis present

## 2016-03-09 DIAGNOSIS — E785 Hyperlipidemia, unspecified: Secondary | ICD-10-CM | POA: Diagnosis not present

## 2016-03-09 DIAGNOSIS — I2510 Atherosclerotic heart disease of native coronary artery without angina pectoris: Secondary | ICD-10-CM | POA: Diagnosis not present

## 2016-03-09 DIAGNOSIS — I110 Hypertensive heart disease with heart failure: Secondary | ICD-10-CM | POA: Diagnosis present

## 2016-03-09 DIAGNOSIS — I214 Non-ST elevation (NSTEMI) myocardial infarction: Principal | ICD-10-CM

## 2016-03-09 DIAGNOSIS — M1990 Unspecified osteoarthritis, unspecified site: Secondary | ICD-10-CM | POA: Diagnosis not present

## 2016-03-09 DIAGNOSIS — Z7982 Long term (current) use of aspirin: Secondary | ICD-10-CM

## 2016-03-09 DIAGNOSIS — Z7902 Long term (current) use of antithrombotics/antiplatelets: Secondary | ICD-10-CM

## 2016-03-09 DIAGNOSIS — I200 Unstable angina: Secondary | ICD-10-CM | POA: Diagnosis not present

## 2016-03-09 DIAGNOSIS — E1169 Type 2 diabetes mellitus with other specified complication: Secondary | ICD-10-CM | POA: Diagnosis present

## 2016-03-09 DIAGNOSIS — Z789 Other specified health status: Secondary | ICD-10-CM

## 2016-03-09 DIAGNOSIS — E119 Type 2 diabetes mellitus without complications: Secondary | ICD-10-CM | POA: Diagnosis not present

## 2016-03-09 LAB — CBC WITH DIFFERENTIAL/PLATELET
Basophils Absolute: 0 10*3/uL (ref 0–0.1)
Basophils Relative: 1 %
Eosinophils Absolute: 0.3 10*3/uL (ref 0–0.7)
Eosinophils Relative: 4 %
HCT: 37.9 % — ABNORMAL LOW (ref 40.0–52.0)
Hemoglobin: 13.1 g/dL (ref 13.0–18.0)
Lymphocytes Relative: 18 %
Lymphs Abs: 1.1 10*3/uL (ref 1.0–3.6)
MCH: 30.6 pg (ref 26.0–34.0)
MCHC: 34.5 g/dL (ref 32.0–36.0)
MCV: 88.9 fL (ref 80.0–100.0)
Monocytes Absolute: 0.4 10*3/uL (ref 0.2–1.0)
Monocytes Relative: 6 %
Neutro Abs: 4.1 10*3/uL (ref 1.4–6.5)
Neutrophils Relative %: 71 %
Platelets: 197 10*3/uL (ref 150–440)
RBC: 4.26 MIL/uL — ABNORMAL LOW (ref 4.40–5.90)
RDW: 15 % — ABNORMAL HIGH (ref 11.5–14.5)
WBC: 5.9 10*3/uL (ref 3.8–10.6)

## 2016-03-09 LAB — GLUCOSE, CAPILLARY
Glucose-Capillary: 235 mg/dL — ABNORMAL HIGH (ref 65–99)
Glucose-Capillary: 184 mg/dL — ABNORMAL HIGH (ref 65–99)
Glucose-Capillary: 147 mg/dL — ABNORMAL HIGH (ref 65–99)

## 2016-03-09 LAB — COMPREHENSIVE METABOLIC PANEL
ALT: 24 U/L (ref 17–63)
AST: 20 U/L (ref 15–41)
Albumin: 3.6 g/dL (ref 3.5–5.0)
Alkaline Phosphatase: 77 U/L (ref 38–126)
Anion gap: 7 (ref 5–15)
BUN: 20 mg/dL (ref 6–20)
CO2: 27 mmol/L (ref 22–32)
Calcium: 8.5 mg/dL — ABNORMAL LOW (ref 8.9–10.3)
Chloride: 104 mmol/L (ref 101–111)
Creatinine, Ser: 0.99 mg/dL (ref 0.61–1.24)
GFR calc Af Amer: >60 mL/min (ref >60)
GFR calc non Af Amer: >60 mL/min (ref >60)
Glucose, Bld: 307 mg/dL — ABNORMAL HIGH (ref 65–99)
Potassium: 3.9 mmol/L (ref 3.5–5.1)
Sodium: 138 mmol/L (ref 135–145)
Total Bilirubin: 0.4 mg/dL (ref 0.3–1.2)
Total Protein: 6.8 g/dL (ref 6.5–8.1)

## 2016-03-09 LAB — TROPONIN I
Troponin I: 0.03 ng/mL (ref <0.03)
Troponin I: 1.31 ng/mL (ref <0.03)
Troponin I: 2.14 ng/mL (ref <0.03)

## 2016-03-09 LAB — APTT: aPTT: 27 seconds (ref 24–36)

## 2016-03-09 LAB — PROTIME-INR
INR: 0.98
Prothrombin Time: 13.2 seconds (ref 11.4–15.0)

## 2016-03-09 LAB — HEPARIN LEVEL (UNFRACTIONATED): Heparin Unfractionated: 0.38 IU/mL (ref 0.30–0.70)

## 2016-03-09 MED ORDER — SODIUM CHLORIDE 0.9% FLUSH: 3.0000 mL | INTRAVENOUS | Status: DC | PRN

## 2016-03-09 MED ORDER — SODIUM CHLORIDE 0.9% FLUSH
3.0000 mL | Freq: Two times a day (BID) | INTRAVENOUS | Status: DC
  Administered 2016-03-10 – 2016-03-11 (×2): 3 mL via INTRAVENOUS

## 2016-03-09 MED ORDER — MORPHINE SULFATE (PF) 4 MG/ML IV SOLN
4.0000 mg | Freq: Once | INTRAVENOUS | Status: AC
  Administered 2016-03-09: 4 mg via INTRAVENOUS
  Filled 2016-03-09: qty 1

## 2016-03-09 MED ORDER — SODIUM CHLORIDE 0.9% FLUSH: 3.0000 mL | Freq: Two times a day (BID) | INTRAVENOUS | Status: DC

## 2016-03-09 MED ORDER — SODIUM CHLORIDE 0.9 % WEIGHT BASED INFUSION
3.0000 mL/kg/h | INTRAVENOUS | Status: DC
  Administered 2016-03-10: 3 mL/kg/h via INTRAVENOUS

## 2016-03-09 MED ORDER — ACETAMINOPHEN 650 MG RE SUPP: 650.0000 mg | Freq: Four times a day (QID) | RECTAL | Status: DC | PRN

## 2016-03-09 MED ORDER — HEPARIN (PORCINE) IN NACL 100-0.45 UNIT/ML-% IJ SOLN
1650.0000 [IU]/h | INTRAMUSCULAR | Status: DC
  Administered 2016-03-09: 1250 [IU]/h via INTRAVENOUS
  Administered 2016-03-10: 1450 [IU]/h via INTRAVENOUS
  Filled 2016-03-09 (×4): qty 250

## 2016-03-09 MED ORDER — ENOXAPARIN SODIUM 40 MG/0.4ML ~~LOC~~ SOLN
40.0000 mg | Freq: Two times a day (BID) | SUBCUTANEOUS | Status: DC
  Administered 2016-03-09: 40 mg via SUBCUTANEOUS
  Filled 2016-03-09: qty 0.4

## 2016-03-09 MED ORDER — OXYCODONE HCL 5 MG PO TABS
5.0000 mg | ORAL_TABLET | ORAL | Status: DC | PRN
  Administered 2016-03-11: 5 mg via ORAL
  Filled 2016-03-09: qty 1

## 2016-03-09 MED ORDER — INSULIN ASPART 100 UNIT/ML ~~LOC~~ SOLN
0.0000 [IU] | Freq: Every day | SUBCUTANEOUS | Status: DC
  Administered 2016-03-10: 2 [IU] via SUBCUTANEOUS
  Filled 2016-03-09 (×2): qty 2

## 2016-03-09 MED ORDER — INSULIN ASPART 100 UNIT/ML ~~LOC~~ SOLN
0.0000 [IU] | Freq: Three times a day (TID) | SUBCUTANEOUS | Status: DC
  Administered 2016-03-09: 2 [IU] via SUBCUTANEOUS
  Administered 2016-03-09 – 2016-03-10 (×4): 3 [IU] via SUBCUTANEOUS
  Administered 2016-03-11: 2 [IU] via SUBCUTANEOUS
  Filled 2016-03-09: qty 3
  Filled 2016-03-09: qty 2
  Filled 2016-03-09 (×3): qty 3
  Filled 2016-03-09: qty 2

## 2016-03-09 MED ORDER — SODIUM CHLORIDE 0.9 % IV SOLN: 250.0000 mL | INTRAVENOUS | Status: DC | PRN

## 2016-03-09 MED ORDER — ACETAMINOPHEN 325 MG PO TABS
650.0000 mg | ORAL_TABLET | Freq: Four times a day (QID) | ORAL | Status: DC | PRN
Start: 2016-03-09 — End: 2016-03-11

## 2016-03-09 MED ORDER — ONDANSETRON HCL 4 MG PO TABS: 4.0000 mg | ORAL_TABLET | Freq: Four times a day (QID) | ORAL | Status: DC | PRN

## 2016-03-09 MED ORDER — ASPIRIN 81 MG PO CHEW
81.0000 mg | CHEWABLE_TABLET | ORAL | Status: AC
  Administered 2016-03-10: 81 mg via ORAL
  Filled 2016-03-09: qty 1

## 2016-03-09 MED ORDER — TICAGRELOR 90 MG PO TABS
90.0000 mg | ORAL_TABLET | Freq: Two times a day (BID) | ORAL | Status: DC
  Administered 2016-03-10 – 2016-03-11 (×2): 90 mg via ORAL
  Filled 2016-03-09 (×2): qty 1

## 2016-03-09 MED ORDER — CLOPIDOGREL BISULFATE 75 MG PO TABS: 75.0000 mg | ORAL_TABLET | Freq: Every day | ORAL | Status: DC

## 2016-03-09 MED ORDER — HEPARIN BOLUS VIA INFUSION
2000.0000 [IU] | Freq: Once | INTRAVENOUS | Status: AC
  Administered 2016-03-09: 2000 [IU] via INTRAVENOUS
  Filled 2016-03-09: qty 2000

## 2016-03-09 MED ORDER — ONDANSETRON HCL 4 MG/2ML IJ SOLN: 4.0000 mg | Freq: Four times a day (QID) | INTRAMUSCULAR | Status: DC | PRN

## 2016-03-09 MED ORDER — PERFLUTREN LIPID MICROSPHERE
1.0000 mL | INTRAVENOUS | Status: AC | PRN
  Administered 2016-03-09: 2 mL via INTRAVENOUS
  Filled 2016-03-09: qty 10

## 2016-03-09 MED ORDER — CARVEDILOL 6.25 MG PO TABS
6.2500 mg | ORAL_TABLET | Freq: Two times a day (BID) | ORAL | Status: DC
  Administered 2016-03-09 – 2016-03-11 (×4): 6.25 mg via ORAL
  Filled 2016-03-09 (×4): qty 1

## 2016-03-09 MED ORDER — MORPHINE SULFATE (PF) 2 MG/ML IV SOLN: 2.0000 mg | INTRAVENOUS | Status: DC | PRN

## 2016-03-09 MED ORDER — ASPIRIN EC 81 MG PO TBEC
81.0000 mg | DELAYED_RELEASE_TABLET | Freq: Every day | ORAL | Status: DC
  Administered 2016-03-11: 81 mg via ORAL
  Filled 2016-03-09 (×2): qty 1

## 2016-03-09 MED ORDER — SODIUM CHLORIDE 0.9 % WEIGHT BASED INFUSION
1.0000 mL/kg/h | INTRAVENOUS | Status: DC
  Administered 2016-03-10 (×2): 1 mL/kg/h via INTRAVENOUS

## 2016-03-09 MED ORDER — PRAVASTATIN SODIUM 40 MG PO TABS
40.0000 mg | ORAL_TABLET | Freq: Every day | ORAL | Status: DC
  Administered 2016-03-09 – 2016-03-10 (×2): 40 mg via ORAL
  Filled 2016-03-09 (×2): qty 1

## 2016-03-09 MED ORDER — AMLODIPINE BESYLATE 5 MG PO TABS
5.0000 mg | ORAL_TABLET | Freq: Every day | ORAL | Status: DC
  Administered 2016-03-11: 5 mg via ORAL
  Filled 2016-03-09 (×2): qty 1

## 2016-03-09 MED ORDER — EZETIMIBE 10 MG PO TABS
10.0000 mg | ORAL_TABLET | Freq: Every day | ORAL | Status: DC
  Administered 2016-03-11: 10 mg via ORAL
  Filled 2016-03-09 (×2): qty 1

## 2016-03-09 MED ORDER — INSULIN ASPART PROT & ASPART (70-30 MIX) 100 UNIT/ML ~~LOC~~ SUSP
45.0000 [IU] | Freq: Two times a day (BID) | SUBCUTANEOUS | Status: DC
  Administered 2016-03-09 – 2016-03-11 (×3): 45 [IU] via SUBCUTANEOUS
  Filled 2016-03-09 (×3): qty 45

## 2016-03-09 MED ORDER — NITROGLYCERIN 0.4 MG SL SUBL
0.4000 mg | SUBLINGUAL_TABLET | SUBLINGUAL | Status: DC | PRN
  Administered 2016-03-09: 0.4 mg via SUBLINGUAL
  Filled 2016-03-09: qty 1

## 2016-03-09 MED ORDER — ONDANSETRON HCL 4 MG/2ML IJ SOLN
4.0000 mg | Freq: Once | INTRAMUSCULAR | Status: AC
  Administered 2016-03-09: 4 mg via INTRAVENOUS
  Filled 2016-03-09: qty 2

## 2016-03-09 NOTE — Progress Notes (Signed)
Troponin remains elevated/ Alycia Rossettiyan, [place] aware/ heparin gtt started/ on schedule for cardiac cath 7/13/ no pain or discomfort reported by pt/ no changes on tele/ will continue to monitor.

## 2016-03-09 NOTE — H&P (Signed)
[facility] [place] [facility]   PATIENT NAME: William SierrasJohn [name]    MR#:  [id number]  DATE OF BIRTH:  [birth year]  DATE OF ADMISSION:  03/09/2016  PRIMARY CARE PHYSICIAN: Kerby NoraAmy Bedsole, MD   REQUESTING/REFERRING PHYSICIAN: Dr Raymon MuttonErika Gayle  CHIEF COMPLAINT:   Chief Complaint  Patient presents with  . Chest Pain    HISTORY OF PRESENT ILLNESS:  [name]  is a 65 y.o. male with a known history of CAD. He presents to the ER with chest pain starting this morning at 8 AM. He was already up and was walking around and sat down. Pain is in the center of his chest 10 out of 10 in intensity sharp at times. Yesterday he was short of breath the entire day. Currently chest pain free after morphine and nitroglycerin in the emergency room. Since his heart attack and stents back in April he has been tired all the time area and these had poor exercise capacity recently. No diaphoresis with episode. Positive for nausea but no vomiting. In the ER his first troponin was negative. Hospitalist services were contacted for further evaluation. He states that he's been taking his Plavix since his stents back in April.  PAST MEDICAL HISTORY:   Past Medical History  Diagnosis Date  . Coronary artery disease, non-occlusive     a. cath 2002 with no sig CAD;  b. cath 2008 normal LM, LAD, LCx, p&dRCA 20-30%, PDA 30%; c.11/2015 NSTEMI/PCI: LM nl, LAD 95p (2.5x15 Xience DES), LCX nl, RCA 100p/m w/ L->R collats, EF 55-65%.  . Diabetes mellitus type 2, insulin dependent (HCC)   . Hyperlipemia   . Hypertensive heart disease   . Osteoarthritis   . Depression   . Kidney stones   . Morbid obesity (HCC)   . Back injury 02/2002    worker's comp  . Snoring     PAST SURGICAL HISTORY:   Past Surgical History  Procedure Laterality Date  . Cardiac catheterization  09/2000    diffuse LAD 30% LCA  EF 50-60%  . Cardiac catheterization  06/2007    no significant CAD  . Cardiac catheterization N/A  12/25/2015    Procedure: Left Heart Cath and Coronary Angiography;  Surgeon: Antonieta Ibaimothy J Gollan, MD;  Location: [facility] INVASIVE CV LAB;  Service: Cardiovascular;  Laterality: N/A;  . Cardiac catheterization N/A 12/25/2015    Procedure: Coronary Stent Intervention;  Surgeon: Alwyn Peawayne D Callwood, MD;  Location: [facility] INVASIVE CV LAB;  Service: Cardiovascular;  Laterality: N/A;  . Circumcision      SOCIAL HISTORY:   Social History  Substance Use Topics  . Smoking status: Never Smoker   . Smokeless tobacco: Never Used  . Alcohol Use: No    FAMILY HISTORY:   Family History  Problem Relation Age of Onset  . Alzheimer's disease Mother   . Emphysema Mother   . Diabetes Father   . Heart disease Father     MI  . Cancer Brother     ? Neck cancer    DRUG ALLERGIES:   Allergies  Allergen Reactions  . Atorvastatin     REACTION: Body aches    REVIEW OF SYSTEMS:  CONSTITUTIONAL: No fever.  Positive for fatigue and weakness.  EYES: Legally blind in right eye and left eye blurry vision. EARS, NOSE, AND THROAT: No tinnitus or ear pain. No sore throat RESPIRATORY: No cough, shortness of breath, wheezing or hemoptysis.  CARDIOVASCULAR: Positive for chest pain and edema.  GASTROINTESTINAL: Positive for  nausea. No vomiting or abdominal pain. No blood in bowel movements. Frequent bowel movements yesterday. GENITOURINARY: No dysuria, hematuria.  ENDOCRINE: No polyuria, nocturia,  HEMATOLOGY: No anemia, easy bruising or bleeding SKIN: Rash right arm and right leg MUSCULOSKELETAL: Positive for joint pains.   NEUROLOGIC: No tingling, numbness, weakness.  PSYCHIATRY: No anxiety. Positive for depression.   MEDICATIONS AT HOME:   Prior to Admission medications   Medication Sig Start Date End Date Taking? Authorizing Provider  amLODipine (NORVASC) 5 MG tablet Take 1 tablet (5 mg total) by mouth daily. 01/12/16  Yes Ok Anis, NP  aspirin 81 MG EC tablet Take 1 tablet (81 mg total) by mouth  daily. 01/12/16  Yes Ok Anis, NP  carvedilol (COREG) 6.25 MG tablet Take 1 tablet (6.25 mg total) by mouth 2 (two) times daily with a meal. Patient taking differently: Take 6.25 mg by mouth daily.  01/12/16  Yes Ok Anis, NP  clopidogrel (PLAVIX) 75 MG tablet Take 1 tablet (75 mg total) by mouth daily with breakfast. 01/12/16  Yes Ok Anis, NP  diclofenac (VOLTAREN) 75 MG EC tablet Take 75 mg by mouth 2 (two) times daily.   Yes Historical Provider, MD  ezetimibe (ZETIA) 10 MG tablet Take 1 tablet (10 mg total) by mouth daily. 01/12/16  Yes Ok Anis, NP  insulin NPH-regular Human (NOVOLIN 70/30 RELION) (70-30) 100 UNIT/ML injection 80 units in the morning and 80 units at night. Patient taking differently: Inject 70-80 Units into the skin See admin instructions. 80 units every morning and 70 units every evening 12/26/15  Yes Enedina Finner, MD  nitroGLYCERIN (NITROSTAT) 0.4 MG SL tablet Place 1 tablet (0.4 mg total) under the tongue every 5 (five) minutes as needed for chest pain. 12/26/15  Yes Enedina Finner, MD      VITAL SIGNS:  Blood pressure 150/65, pulse 72, temperature 98.1 F (36.7 C), temperature source Oral, resp. rate 18, height  (1.702 m), weight 137.893 kg (304 lb), SpO2 95 %.  PHYSICAL EXAMINATION:  GENERAL:  65 y.o.-year-old patient lying in the bed with no acute distress.  EYES: Pupils equal, round, reactive to light and accommodation. No scleral icterus. Extraocular muscles intact.  HEENT: Head atraumatic, normocephalic. Oropharynx and nasopharynx clear.  NECK:  Supple, no jugular venous distention. No thyroid enlargement, no tenderness.  LUNGS: Normal breath sounds bilaterally, no wheezing, rales,rhonchi or crepitation. No use of accessory muscles of respiration.  CARDIOVASCULAR: S1, S2 normal. No murmurs, rubs, or gallops.  ABDOMEN: Soft, nontender, nondistended. Bowel sounds present. No organomegaly or mass.  EXTREMITIES: 2+ edema. No  cyanosis, or clubbing.  NEUROLOGIC: Cranial nerves II through XII are intact. Muscle strength 5/5 in all extremities. Sensation intact. Gait not checked.  PSYCHIATRIC: The patient is alert and oriented x 3.  SKIN: No rash, lesion, or ulcer.   LABORATORY PANEL:   CBC  Recent Labs Lab 03/09/16 0837  WBC 5.9  HGB 13.1  HCT 37.9*  PLT 197   ------------------------------------------------------------------------------------------------------------------  Chemistries   Recent Labs Lab 03/09/16 0837  NA 138  K 3.9  CL 104  CO2 27  GLUCOSE 307*  BUN 20  CREATININE 0.99  CALCIUM 8.5*  AST 20  ALT 24  ALKPHOS 77  BILITOT 0.4   ------------------------------------------------------------------------------------------------------------------  Cardiac Enzymes  Recent Labs Lab 03/09/16 0837  TROPONINI 0.03*   ------------------------------------------------------------------------------------------------------------------  RADIOLOGY:  Dg Chest Portable 1 View  03/09/2016  CLINICAL DATA:  Central chest pain shortness of breath for  1 day, coronary disease, diabetes mellitus, hypertension, hyperlipidemia EXAM: PORTABLE CHEST 1 VIEW COMPARISON:  Portable exam 0844 hours compared to 12/24/2015 FINDINGS: Enlargement of cardiac silhouette. Mediastinal contours and pulmonary vascularity normal. Lungs clear. No pleural effusion or pneumothorax. Bones unremarkable. IMPRESSION: Enlargement of cardiac silhouette. No acute abnormalities. Electronically Signed   By: Ulyses Southward M.D.   On: 03/09/2016 09:01    EKG:   Normal sinus rhythm 71 bpm  IMPRESSION AND PLAN:   1. Unstable angina with chest pain shortness of breath and nausea. Patient has history of coronary artery disease with recent stents. I will get 2 more sets of cardiac enzymes. Observe on telemetry. If cardiology consult for further testing. Resume aspirin, Plavix, Coreg. Patient has intolerance to statins. 2. Type 2  diabetes mellitus last hemoglobin A1c elevated at 8.1. Give half dose of his insulin this evening just in case testing tomorrow. 3. Essential hypertension continue usual medications and monitor 4. Hyperlipidemia unspecified. Continue Zetia 5. Morbid obesity. Weight loss needed.  All the records are reviewed and case discussed with ED provider. Management plans discussed with the patient, family and they are in agreement.  CODE STATUS: Full code  TOTAL TIME TAKING CARE OF THIS PATIENT: 50 minutes.    Alford Highland M.D on 03/09/2016 at 10:40 AM  Between 7am to 6pm - Pager - [phone]  After 6pm call admission pager [phone]  [facility]  [phone]  CC: Primary care physician; Kerby Nora, MD

## 2016-03-09 NOTE — Progress Notes (Signed)
Ryan, [place] paged to make aware of elevated troponin

## 2016-03-09 NOTE — Consult Note (Signed)
Cardiology Consultation Note  Patient ID: LLIAM HOH, MRN: [id number], DOB/AGE: [birth year] 65 y.o. Admit date: 03/09/2016   Date of Consult: 03/09/2016 Primary Physician: [name], MD Primary Cardiologist: Dr. Rockey Situ, MD Requesting Physician: Dr. Leslye Peer, MD  Chief Complaint: Chest pain Reason for Consult: Unstable angina  HPI: 65 y.o. male with h/o coronary artery disease s/p recent NSTEMI 11/2015 s/p PCI/DES to the LAD with CTO of the RCA, hypertension, hyperlipidemia, type 2 diabetes mellitus, and morbid obesity who returns to [facility] on 7/12 with chest pain.   He has a cardiac history dating back to 2002 at which time, he underwent diagnostic catheterization revealing no significant CAD. Since gone catheterization 2008 again showed nonobstructive CAD. He was admitted to [facility] regional in late April 2017 with a two-month history of progressive exertional chest discomfort. He ruled in for non-STEMI and subsequent underwent catheterization revealing a chronic total occlusion of the right coronary artery with severe proximal LAD stenosis. The LAD was felt to be the culprit vessel and this was successfully stented using a drug-eluting stent. At hospital follow up he was doing well and was taking full-dose aspirin and Plavix with intermittnet indegestion following meals, relieved with milk. He had not had any chest pain or SOB. His cath site had healed well.   He returned to [facility] on 7/12 with development of substernal chest pain this morning that radiated to his bilateral arms. Patient had just gone to get some breakfast for himself and his wife. He was back a home preparing to mow the lawn when he developed the above chest pain. Pain was associated with SOB. Pain lasted 1.5 hours and did not resolve until he got to [facility] ED and had taken SL NTG x 3. He is currently chest pain free. Of note, the patient noted increased SOB on 7/11 and has been fatigued since his MI in April. He has not missed any  doses of aspirin or Plavix. He is not taking his statin 2/2 myalgias.   Labs showed an initial troponin 0.03, SCr 0.99, K+ 3.9, WBC 5.9, HGB 13.1, CXR with no acute cardiopulmonary abnormalities, EKG showed NSR, 71 bpm, low voltage precoridal leads, no acute st/t changes.    Past Medical History  Diagnosis Date  . Coronary artery disease, non-occlusive     a. cath 2002 with no sig CAD;  b. cath 2008 normal LM, LAD, LCx, p&dRCA 20-30%, PDA 30%; c.11/2015 NSTEMI/PCI: LM nl, LAD 95p (2.5x15 Xience DES), LCX nl, RCA 100p/m w/ L->R collats, EF 55-65%.  . Diabetes mellitus type 2, insulin dependent ([place])   . Hyperlipemia   . Hypertensive heart disease   . Osteoarthritis   . Depression   . Kidney stones   . Morbid obesity ([place])   . Back injury 02/2002    worker's comp  . Snoring       Most Recent Cardiac Studies: Cardiac cath 12/25/2015: Coronary angiography:  Coronary dominance: Right  Left mainstem: Large vessel, short, that bifurcates into the LAD and left circumflex, no significant disease noted  Left anterior descending (LAD): Large vessel that extends to the apical region, diagonal branch 2 of moderate size, there is severe proximal LAD disease before the takeoff of the diagional #1. Challenging image quality, though there appears to be 60 % disease after the takeoff of the diagonal branch  Left circumflex (LCx): Large vessel with OM branch 2, no significant disease noted though not well visualized.  Right coronary artery (RCA): Right dominant vessel with PL  and PDA, occluded in the proximal to mid region. Collaterals from left to right. PDA and PL branches are small to moderate in size.   Left ventriculography: Left ventricular systolic function is normal, LVEF is estimated at 55-65%, there is no significant mitral regurgitation , no significant aortic valve stenosis  Final Conclusions:  Chronically occluded RCA in the proximal to mid region. Severe proximal LAD disease,  Unable  to exclude moderate mid LAD disease after the takeoff of the diagonal branch. Normal EF >55%, no significant AS or MR  Coronary Findings    Dominance: Right   Left Anterior Descending   . Mid LAD lesion, 95% stenosed. The lesion is type C Discrete located proximal to major branch. The lesion was not previously treated. Pressure wire/FFR was not performed on the lesionIVUS was not performed on the lesion.   Marland Kitchen PCI: The pre-interventional distal flow is normal (TIMI 3). Pre-stent angioplasty was performed. A drug-eluting stent was placed. Minimum lumen area: 2.8 mm. The strut is apposed. Post-stent angioplasty was performed. Lesion length: 12 mm. Maximum pressure: 12 atm. The post-interventional distal flow is normal (TIMI 3). The intervention was successful. No complications occurred at this lesion. Pressure wire/FFR was not performed on the lesion. IVUS was not performed on the lesion. FVR was not performed on the lesion. No optical coherence tomography (OCT) was performed.  . There is no residual stenosis post intervention.        Coronary Diagrams    Diagnostic Diagram           Post-Intervention Diagram           Surgical History:  Past Surgical History  Procedure Laterality Date  . Cardiac catheterization  09/2000    diffuse LAD 30% LCA  EF 50-60%  . Cardiac catheterization  06/2007    no significant CAD  . Cardiac catheterization N/A 12/25/2015    Procedure: Left Heart Cath and Coronary Angiography;  Surgeon: Minna Merritts, MD;  Location: [place] CV LAB;  Service: Cardiovascular;  Laterality: N/A;  . Cardiac catheterization N/A 12/25/2015    Procedure: Coronary Stent Intervention;  Surgeon: Yolonda Kida, MD;  Location: [place] CV LAB;  Service: Cardiovascular;  Laterality: N/A;  . Circumcision       Home Meds: Prior to Admission medications   Medication Sig Start Date End Date Taking? Authorizing Provider  amLODipine (NORVASC) 5 MG tablet Take 1  tablet (5 mg total) by mouth daily. 01/12/16  Yes Rogelia Mire, NP  aspirin 81 MG EC tablet Take 1 tablet (81 mg total) by mouth daily. 01/12/16  Yes Rogelia Mire, NP  carvedilol (COREG) 6.25 MG tablet Take 1 tablet (6.25 mg total) by mouth 2 (two) times daily with a meal. Patient taking differently: Take 6.25 mg by mouth daily.  01/12/16  Yes Rogelia Mire, NP  clopidogrel (PLAVIX) 75 MG tablet Take 1 tablet (75 mg total) by mouth daily with breakfast. 01/12/16  Yes Rogelia Mire, NP  diclofenac (VOLTAREN) 75 MG EC tablet Take 75 mg by mouth 2 (two) times daily.   Yes Historical Provider, MD  ezetimibe (ZETIA) 10 MG tablet Take 1 tablet (10 mg total) by mouth daily. 01/12/16  Yes Rogelia Mire, NP  insulin NPH-regular Human (NOVOLIN 70/30 RELION) (70-30) 100 UNIT/ML injection 80 units in the morning and 80 units at night. Patient taking differently: Inject 70-80 Units into the skin See admin instructions. 80 units every morning and 70 units every evening  12/26/15  Yes Fritzi Mandes, MD  nitroGLYCERIN (NITROSTAT) 0.4 MG SL tablet Place 1 tablet (0.4 mg total) under the tongue every 5 (five) minutes as needed for chest pain. 12/26/15  Yes Fritzi Mandes, MD    Inpatient Medications:  . amLODipine  5 mg Oral Daily  . aspirin  81 mg Oral Daily  . carvedilol  6.25 mg Oral BID WC  . [START ON 03/10/2016] clopidogrel  75 mg Oral Q breakfast  . enoxaparin (LOVENOX) injection  40 mg Subcutaneous Q24H  . ezetimibe  10 mg Oral Daily  . insulin aspart  0-5 Units Subcutaneous QHS  . insulin aspart  0-9 Units Subcutaneous TID WC  . insulin aspart protamine- aspart  45 Units Subcutaneous BID WC  . sodium chloride flush  3 mL Intravenous Q12H      Allergies:  Allergies  Allergen Reactions  . Atorvastatin     REACTION: Body aches    Social History   Social History  . Marital Status: Married    Spouse Name: N/A  . Number of Children: N/A  . Years of Education: N/A    Occupational History  . disabled     back injury   Social History Main Topics  . Smoking status: Never Smoker   . Smokeless tobacco: Never Used  . Alcohol Use: No  . Drug Use: No  . Sexual Activity: Not on file   Other Topics Concern  . Not on file   Social History Narrative   Financial concerns, wife with depression.   Minimal exercise.   Diet: poor.     Family History  Problem Relation Age of Onset  . Alzheimer's disease Mother   . Emphysema Mother   . Diabetes Father   . Heart disease Father     MI  . Cancer Brother     ? Neck cancer     Review of Systems: Review of Systems  Constitutional: Positive for malaise/fatigue. Negative for fever, chills, weight loss and diaphoresis.  HENT: Negative for congestion.   Eyes: Negative for discharge and redness.  Respiratory: Positive for shortness of breath. Negative for cough, hemoptysis, sputum production and wheezing.   Cardiovascular: Positive for chest pain and leg swelling. Negative for palpitations, orthopnea, claudication and PND.  Gastrointestinal: Negative for nausea, vomiting, blood in stool and melena.  Musculoskeletal: Negative for falls.  Skin: Negative for rash.  Neurological: Positive for weakness. Negative for dizziness, sensory change, speech change, focal weakness and loss of consciousness.  Endo/Heme/Allergies: Does not bruise/bleed easily.  Psychiatric/Behavioral: Negative for substance abuse. The patient is not nervous/anxious.   All other systems reviewed and are negative.   Labs:  Recent Labs  03/09/16 0837  TROPONINI 0.03*   Lab Results  Component Value Date   WBC 5.9 03/09/2016   HGB 13.1 03/09/2016   HCT 37.9* 03/09/2016   MCV 88.9 03/09/2016   PLT 197 03/09/2016     Recent Labs Lab 03/09/16 0837  NA 138  K 3.9  CL 104  CO2 27  BUN 20  CREATININE 0.99  CALCIUM 8.5*  PROT 6.8  BILITOT 0.4  ALKPHOS 77  ALT 24  AST 20  GLUCOSE 307*   Lab Results  Component Value Date    CHOL 197 02/16/2016   HDL 33.20* 02/16/2016   LDLCALC 139* 02/16/2016   TRIG 122.0 02/16/2016   Lab Results  Component Value Date   DDIMER * 07/03/2007    0.77        AT  THE INHOUSE ESTABLISHED CUTOFF VALUE OF 0.48 ug/mL FEU, THIS ASSAY HAS BEEN DOCUMENTED IN THE LITERATURE TO HAVE    Radiology/Studies:  Dg Chest Portable 1 View  03/09/2016  CLINICAL DATA:  Central chest pain shortness of breath for 1 day, coronary disease, diabetes mellitus, hypertension, hyperlipidemia EXAM: PORTABLE CHEST 1 VIEW COMPARISON:  Portable exam 0844 hours compared to 12/24/2015 FINDINGS: Enlargement of cardiac silhouette. Mediastinal contours and pulmonary vascularity normal. Lungs clear. No pleural effusion or pneumothorax. Bones unremarkable. IMPRESSION: Enlargement of cardiac silhouette. No acute abnormalities. Electronically Signed   By: Lavonia Dana M.D.   On: 03/09/2016 09:01    EKG: Interpreted by me showed: NSR, 71 bpm, low voltage precoridal leads, no acute st/t changes  Telemetry: Interpreted by me showed: NSR  Weights: Filed Weights   03/09/16 0842  Weight: 304 lb (137.893 kg)     Physical Exam: Blood pressure 120/60, pulse 73, temperature 98.1 F (36.7 C), temperature source Oral, resp. rate 18, height 5' 7"  (1.702 m), weight 304 lb (137.893 kg), SpO2 99 %. Body mass index is 47.6 kg/(m^2). General: Well developed, well nourished, in no acute distress. Head: Normocephalic, atraumatic, sclera non-icteric, no xanthomas, nares are without discharge.  Neck: Negative for carotid bruits. JVD not elevated. Lungs: Clear bilaterally to auscultation without wheezes, rales, or rhonchi. Breathing is unlabored. Heart: RRR with S1 S2. No murmurs, rubs, or gallops appreciated. Palpitation does not reproduce the patient's symptoms.  Abdomen: Obese, soft, non-tender, non-distended with normoactive bowel sounds. No hepatomegaly. No rebound/guarding. No obvious abdominal masses. Msk:  Strength and tone  appear normal for age. Extremities: No clubbing or cyanosis. Trace pre-tibial edema. Distal pedal pulses are 2+ and equal bilaterally. Neuro: Alert and oriented X 3. No facial asymmetry. No focal deficit. Moves all extremities spontaneously. Psych:  Responds to questions appropriately with a normal affect.    Assessment and Plan:  Principal Problem:   Unstable angina (HCC) Active Problems:   CAD (coronary artery disease)   Uncontrolled diabetes mellitus with retinopathy ([place])   Hypertensive heart disease   Hyperlipidemia LDL goal <70   Morbid obesity ([place])    1. Unstable angina/CAD s/p recent NSTEMI 11/2015 s/p PCI/DES to the LAD with CTO of the RCA: -Pain is similar to his prior MI in April 2017 -Currently without chest pain -Initial troponin 0.03. Continue to cycle troponin levels this afternoon -If troponin remains negative plan for Lexiscan Myoview on 03/10/16. If troponin trends positive plan for repeat cardiac cath on 03/10/16 with Dr. Fletcher Anon, MD -Check echo to evaluate LVSF, wall motion, and RVSP given patient's pain, SOB, and increased fatigue -Continue DAPT with aspirin and Plavix -Continue Coreg as BP allows -Not on a statin 2/2 myalgias, continue Zetia  2. Hypertensive heart disease: -BP soft at this time s/p SL NTG -Monitor, address accordingly  -Continue current medications as BP allows  3. Uncontrolled IDDM: -Per IM -Last A1C 8.1% on 02/16/16  4. HLD: -Zetia as above -Intolerant to statins 2/2 myalgias -LDL not at goal as of 02/16/16 -Lifestyle changes are recommended   5. Morbid obesity: -Would benefit from cardiac rehab   Signed, Marcille Blanco [place] Pager: [phone] 03/09/2016, 11:25 AM

## 2016-03-09 NOTE — ED Notes (Signed)
Pt arrived via EMS from home for reports of non-radiating chest pain that began approximately 45 minutes ago. Pt reports mild SOB and nausea. EMS administered 3 NTG and 324 mg ASA. EMS reports 164/90, CBG 409.

## 2016-03-09 NOTE — Progress Notes (Signed)
ANTICOAGULATION CONSULT NOTE - Initial Consult  Pharmacy Consult for Heparin Indication: chest pain/ACS  Allergies  Allergen Reactions  . Atorvastatin     REACTION: Body aches    Patient Measurements: Height:  (170.2 cm) Weight: (!) 301 lb 14.4 oz (136.941 kg) IBW/kg (Calculated) : 66.1 Heparin Dosing Weight: 98.2  Vital Signs: Temp: 98.3 F (36.8 C) (07/12 1136) Temp Source: Oral (07/12 1136) BP: 125/67 mmHg (07/12 1136) Pulse Rate: 66 (07/12 1136)  Labs:  Recent Labs  03/09/16 0837 03/09/16 1259  HGB 13.1  --   HCT 37.9*  --   PLT 197  --   APTT 27  --   LABPROT 13.2  --   INR 0.98  --   CREATININE 0.99  --   TROPONINI 0.03* 1.31*    Estimated Creatinine Clearance: 98 mL/min (by C-G formula based on Cr of 0.99).   Medical History: Past Medical History  Diagnosis Date  . Coronary artery disease, non-occlusive     a. cath 2002 with no sig CAD;  b. cath 2008 normal LM, LAD, LCx, p&dRCA 20-30%, PDA 30%; c.11/2015 NSTEMI/PCI: LM nl, LAD 95p (2.5x15 Xience DES), LCX nl, RCA 100p/m w/ L->R collats, EF 55-65%.  . Diabetes mellitus type 2, insulin dependent (HCC)   . Hyperlipemia   . Hypertensive heart disease   . Osteoarthritis   . Depression   . Kidney stones   . Morbid obesity (HCC)   . Back injury 02/2002    worker's comp  . Snoring     Medications:  Prescriptions prior to admission  Medication Sig Dispense Refill Last Dose  . amLODipine (NORVASC) 5 MG tablet Take 1 tablet (5 mg total) by mouth daily. 30 tablet 5 03/09/2016 at am  . aspirin 81 MG EC tablet Take 1 tablet (81 mg total) by mouth daily.   03/09/2016 at 0600  . carvedilol (COREG) 6.25 MG tablet Take 1 tablet (6.25 mg total) by mouth 2 (two) times daily with a meal. (Patient taking differently: Take 6.25 mg by mouth daily. ) 60 tablet 5 03/09/2016 at 0600  . clopidogrel (PLAVIX) 75 MG tablet Take 1 tablet (75 mg total) by mouth daily with breakfast. 30 tablet 5 03/09/2016 at 0600  . diclofenac  (VOLTAREN) 75 MG EC tablet Take 75 mg by mouth 2 (two) times daily.   03/09/2016 at am  . ezetimibe (ZETIA) 10 MG tablet Take 1 tablet (10 mg total) by mouth daily. 30 tablet 5 03/09/2016 at am  . insulin NPH-regular Human (NOVOLIN 70/30 RELION) (70-30) 100 UNIT/ML injection 80 units in the morning and 80 units at night. (Patient taking differently: Inject 70-80 Units into the skin See admin instructions. 80 units every morning and 70 units every evening) 40 mL 5 03/09/2016 at am  . nitroGLYCERIN (NITROSTAT) 0.4 MG SL tablet Place 1 tablet (0.4 mg total) under the tongue every 5 (five) minutes as needed for chest pain. 20 tablet 2 PRN   Scheduled:  . [START ON 03/10/2016] amLODipine  5 mg Oral Daily  . [START ON 03/10/2016] aspirin  81 mg Oral Daily  . carvedilol  6.25 mg Oral BID WC  . [START ON 03/10/2016] ezetimibe  10 mg Oral Daily  . heparin  2,000 Units Intravenous Once  . insulin aspart  0-5 Units Subcutaneous QHS  . insulin aspart  0-9 Units Subcutaneous TID WC  . insulin aspart protamine- aspart  45 Units Subcutaneous BID WC  . sodium chloride flush  3 mL Intravenous  Q12H  . [START ON 03/10/2016] ticagrelor  90 mg Oral BID    Assessment: Pharmacy consulted to dose and monitor heparin therapy in this 65 year old woman with ACS.  Goal of Therapy:  HL goal of 0.3-0.7 Monitor platelets by anticoagulation protocol: Yes   Plan:  Patient is currently on Ticagrelor and Aspirin therapy; therefore will give a lower heparin bolus. Will give heparin bolus 2000 units x 1 and will start heparin gtt @ 1250 units/hr. HL ordered @ 21:00. Pharmacy to follow and adjust per consult.     Rogena Deupree D 03/09/2016,2:43 PM

## 2016-03-09 NOTE — Progress Notes (Addendum)
ANTICOAGULATION CONSULT NOTE - Initial Consult  Pharmacy Consult for Heparin Indication: chest pain/ACS  Allergies  Allergen Reactions  . Atorvastatin     REACTION: Body aches    Patient Measurements: Height:  (170.2 cm) Weight: (!) 301 lb 14.4 oz (136.941 kg) IBW/kg (Calculated) : 66.1 Heparin Dosing Weight: 98.2  Vital Signs: Temp: 98.3 F (36.8 C) (07/12 2033) Temp Source: Oral (07/12 2033) BP: 123/55 mmHg (07/12 2120) Pulse Rate: 72 (07/12 2120)  Labs:  Recent Labs  03/09/16 0837 03/09/16 1259 03/09/16 1616 03/09/16 2115  HGB 13.1  --   --   --   HCT 37.9*  --   --   --   PLT 197  --   --   --   APTT 27  --   --   --   LABPROT 13.2  --   --   --   INR 0.98  --   --   --   HEPARINUNFRC  --   --   --  0.38  CREATININE 0.99  --   --   --   TROPONINI 0.03* 1.31* 2.14*  --     Estimated Creatinine Clearance: 98 mL/min (by C-G formula based on Cr of 0.99).   Medical History: Past Medical History  Diagnosis Date  . Coronary artery disease, non-occlusive     a. cath 2002 with no sig CAD;  b. cath 2008 normal LM, LAD, LCx, p&dRCA 20-30%, PDA 30%; c.11/2015 NSTEMI/PCI: LM nl, LAD 95p (2.5x15 Xience DES), LCX nl, RCA 100p/m w/ L->R collats, EF 55-65%.  . Diabetes mellitus type 2, insulin dependent (HCC)   . Hyperlipemia   . Hypertensive heart disease   . Osteoarthritis   . Depression   . Kidney stones   . Morbid obesity (HCC)   . Back injury 02/2002    worker's comp  . Snoring     Medications:  Prescriptions prior to admission  Medication Sig Dispense Refill Last Dose  . amLODipine (NORVASC) 5 MG tablet Take 1 tablet (5 mg total) by mouth daily. 30 tablet 5 03/09/2016 at am  . aspirin 81 MG EC tablet Take 1 tablet (81 mg total) by mouth daily.   03/09/2016 at 0600  . carvedilol (COREG) 6.25 MG tablet Take 1 tablet (6.25 mg total) by mouth 2 (two) times daily with a meal. (Patient taking differently: Take 6.25 mg by mouth daily. ) 60 tablet 5 03/09/2016 at  0600  . clopidogrel (PLAVIX) 75 MG tablet Take 1 tablet (75 mg total) by mouth daily with breakfast. 30 tablet 5 03/09/2016 at 0600  . diclofenac (VOLTAREN) 75 MG EC tablet Take 75 mg by mouth 2 (two) times daily.   03/09/2016 at am  . ezetimibe (ZETIA) 10 MG tablet Take 1 tablet (10 mg total) by mouth daily. 30 tablet 5 03/09/2016 at am  . insulin NPH-regular Human (NOVOLIN 70/30 RELION) (70-30) 100 UNIT/ML injection 80 units in the morning and 80 units at night. (Patient taking differently: Inject 70-80 Units into the skin See admin instructions. 80 units every morning and 70 units every evening) 40 mL 5 03/09/2016 at am  . nitroGLYCERIN (NITROSTAT) 0.4 MG SL tablet Place 1 tablet (0.4 mg total) under the tongue every 5 (five) minutes as needed for chest pain. 20 tablet 2 PRN   Scheduled:  . [START ON 03/10/2016] amLODipine  5 mg Oral Daily  . [START ON 03/10/2016] aspirin  81 mg Oral Pre-Cath  . [START ON 03/10/2016] aspirin  81 mg Oral Daily  . carvedilol  6.25 mg Oral BID WC  . [START ON 03/10/2016] ezetimibe  10 mg Oral Daily  . insulin aspart  0-5 Units Subcutaneous QHS  . insulin aspart  0-9 Units Subcutaneous TID WC  . insulin aspart protamine- aspart  45 Units Subcutaneous BID WC  . pravastatin  40 mg Oral q1800  . sodium chloride flush  3 mL Intravenous Q12H  . sodium chloride flush  3 mL Intravenous Q12H  . [START ON 03/10/2016] ticagrelor  90 mg Oral BID    Assessment: Pharmacy consulted to dose and monitor heparin therapy in this 65 year old woman with ACS.  Goal of Therapy:  HL goal of 0.3-0.7 Monitor platelets by anticoagulation protocol: Yes   Plan:  Patient is currently on Ticagrelor and Aspirin therapy; therefore will give a lower heparin bolus. Will give heparin bolus 2000 units x 1 and will start heparin gtt @ 1250 units/hr. HL ordered @ 21:00. Pharmacy to follow and adjust per consult.   7/12 21:00 heparin level 0.38. Recheck with AM labs to confirm.  7/13 AM heparin  level 0.26. 1500 unit bolus and increase rate to 1450 units/hr. Recheck in 6 hours.    Reeanna Acri S 03/09/2016,9:42 PM

## 2016-03-09 NOTE — ED Provider Notes (Signed)
[facility] Emergency Department Provider Note   ____________________________________________  Time seen: Approximately 8:47 AM  I have reviewed the triage vital signs and the nursing notes.   HISTORY  Chief Complaint Chest Pain    HPI [name] is a 65 y.o. male with coronary artery disease suspect NSTEMI and catheterization showing severe LAD and RCA disease in 11/2015 treated with DES, hypertension, hyperlipidemia, type 2 diabetes mellitus, and morbid obesity who presents for evaluation of less than one hour of nonradiating nonpleuritic central chest pressure that began while he was driving, gradual onset, constant, now significantly improved after full dose aspirin and 3 nitroglycerin tablets given by EMS. He reports that he also had shortness of breath and nausea. He reports that this does feel similar to when he previous he presented to the hospital for treatment of myocardial infarction. No recent illness including no cough, sneezing, runny nose, congestion, vomiting, diarrhea, fevers or chills.    Past Medical History  Diagnosis Date  . Coronary artery disease, non-occlusive     a. cath 2002 with no sig CAD;  b. cath 2008 normal LM, LAD, LCx, p&dRCA 20-30%, PDA 30%; c.11/2015 NSTEMI/PCI: LM nl, LAD 95p (2.5x15 Xience DES), LCX nl, RCA 100p/m w/ L->R collats, EF 55-65%.  . Diabetes mellitus type 2, insulin dependent (HCC)   . Hyperlipemia   . Hypertensive heart disease   . Osteoarthritis   . Depression   . Kidney stones   . Morbid obesity (HCC)   . Back injury 02/2002    worker's comp  . Snoring     Patient Active Problem List   Diagnosis Date Noted  . Hypertensive heart disease   . Diabetes mellitus type 2, insulin dependent (HCC)   . Coronary artery disease, non-occlusive   . Morbid obesity (HCC)   . NSTEMI (non-ST elevated myocardial infarction) (HCC)   . Uncontrolled type 2 diabetes mellitus with circulatory disorder (HCC)   .  Unstable angina (HCC) 12/24/2015  . Advanced care planning/counseling discussion 11/20/2015  . Noncompliance with diabetes treatment 11/05/2015  . Noncompliance with diet and medication regimen 11/05/2015  . Right medial knee pain 09/23/2014  . Diabetic retinopathy (HCC) 09/23/2014  . Snoring 12/21/2012  . HYPOGONADISM 09/28/2010  . VITAMIN B12 DEFICIENCY 09/28/2010  . UNSPECIFIED VITAMIN D DEFICIENCY 09/28/2010  . OTHER MALAISE AND FATIGUE 09/17/2010  . TRIGGER FINGER, RIGHT MIDDLE 09/14/2010  . BRANCH RETINAL VEIN OCCLUSION 11/26/2009  . LEG PAIN, BILATERAL 10/06/2009  . CONSTIPATION 08/10/2009  . GASTRITIS 07/29/2009  . Major depressive disorder, recurrent episode, moderate (HCC) 07/06/2007  . ERECTILE DYSFUNCTION 04/10/2007  . Uncontrolled diabetes mellitus with retinopathy (HCC) 03/26/2007  . OBESITY 03/26/2007  . CAD (coronary artery disease) 03/26/2007  . RENAL CALCULUS, HX OF 03/26/2007  . Hyperlipidemia LDL goal <70 12/05/2006  . Essential hypertension, benign 12/05/2006  . OSTEOARTHRITIS 12/05/2006    Past Surgical History  Procedure Laterality Date  . Cardiac catheterization  09/2000    diffuse LAD 30% LCA  EF 50-60%  . Cardiac catheterization  06/2007    no significant CAD  . Cardiac catheterization N/A 12/25/2015    Procedure: Left Heart Cath and Coronary Angiography;  Surgeon: Antonieta Iba, MD;  Location: [facility] INVASIVE CV LAB;  Service: Cardiovascular;  Laterality: N/A;  . Cardiac catheterization N/A 12/25/2015    Procedure: Coronary Stent Intervention;  Surgeon: Alwyn Pea, MD;  Location: [facility] INVASIVE CV LAB;  Service: Cardiovascular;  Laterality: N/A;    Current Outpatient  Rx  Name  Route  Sig  Dispense  Refill  . amLODipine (NORVASC) 5 MG tablet   Oral   Take 1 tablet (5 mg total) by mouth daily.   30 tablet   5   . aspirin 81 MG EC tablet   Oral   Take 1 tablet (81 mg total) by mouth daily.         . carvedilol (COREG) 6.25 MG tablet    Oral   Take 1 tablet (6.25 mg total) by mouth 2 (two) times daily with a meal. Patient taking differently: Take 6.25 mg by mouth daily.    60 tablet   5   . clopidogrel (PLAVIX) 75 MG tablet   Oral   Take 1 tablet (75 mg total) by mouth daily with breakfast.   30 tablet   5   . diclofenac (VOLTAREN) 75 MG EC tablet   Oral   Take 75 mg by mouth 2 (two) times daily.         Marland Kitchen. ezetimibe (ZETIA) 10 MG tablet   Oral   Take 1 tablet (10 mg total) by mouth daily.   30 tablet   5   . insulin NPH-regular Human (NOVOLIN 70/30 RELION) (70-30) 100 UNIT/ML injection      80 units in the morning and 80 units at night. Patient taking differently: Inject 70-80 Units into the skin See admin instructions. 80 units every morning and 70 units every evening   40 mL   5   . nitroGLYCERIN (NITROSTAT) 0.4 MG SL tablet   Sublingual   Place 1 tablet (0.4 mg total) under the tongue every 5 (five) minutes as needed for chest pain.   20 tablet   2   . exenatide (BYETTA) 5 MCG/0.02ML SOPN injection   Subcutaneous   Inject 0.02 mLs (5 mcg total) into the skin 2 (two) times daily with a meal. Patient not taking: Reported on 03/09/2016   1.2 mL   11   . triamterene-hydrochlorothiazide (MAXZIDE-25) 37.5-25 MG tablet   Oral   Take 1 tablet by mouth daily. Patient not taking: Reported on 03/09/2016   90 tablet   1     Allergies Atorvastatin  Family History  Problem Relation Age of Onset  . Alzheimer's disease Mother   . Emphysema Mother   . Diabetes Father   . Heart disease Father     MI  . Cancer Brother     ? Neck cancer    Social History Social History  Substance Use Topics  . Smoking status: Never Smoker   . Smokeless tobacco: Never Used  . Alcohol Use: No    Review of Systems Constitutional: No fever/chills Eyes: No visual changes. ENT: No sore throat. Cardiovascular: + chest pain. Respiratory: + shortness of breath. Gastrointestinal: No abdominal pain.  + nausea, no  vomiting.  No diarrhea.  No constipation. Genitourinary: Negative for dysuria. Musculoskeletal: Negative for back pain. Skin: Negative for rash. Neurological: Negative for headaches, focal weakness or numbness.  10-point ROS otherwise negative.  ____________________________________________   PHYSICAL EXAM:  VITAL SIGNS: ED Triage Vitals  Enc Vitals Group     BP 03/09/16 0842 150/65 mmHg     Pulse Rate 03/09/16 0842 72     Resp 03/09/16 0842 18     Temp 03/09/16 0842 98.1 F (36.7 C)     Temp Source 03/09/16 0842 Oral     SpO2 03/09/16 0842 95 %     Weight 03/09/16 0842 304  lb (137.893 kg)     Height 03/09/16 0842  (1.702 m)     Head Cir --      Peak Flow --      Pain Score 03/09/16 0841 1     Pain Loc --      Pain Edu? --      Excl. in GC? --     Constitutional: Alert and oriented. Nontoxic appearing and in no acute distress. Eyes: Conjunctivae are normal. PERRL. EOMI. Head: Atraumatic. Nose: No congestion/rhinnorhea. Mouth/Throat: Mucous membranes are moist.  Oropharynx non-erythematous. Neck: No stridor. Cardiovascular: Normal rate, regular rhythm. Grossly normal heart sounds.  Good peripheral circulation. Respiratory: Normal respiratory effort.  No retractions. Lungs CTAB. Gastrointestinal: Soft and nontender. No distention.  No CVA tenderness. Genitourinary: deferred Musculoskeletal:  No joint effusions. 1+ pitting edema bilateral lower extremities no calf tenderness or asymmetry. Neurologic:  Normal speech and language. No gross focal neurologic deficits are appreciated.  Skin:  Skin is warm, dry and intact. No rash noted. Psychiatric: Mood and affect are normal. Speech and behavior are normal.  ____________________________________________   LABS (all labs ordered are listed, but only abnormal results are displayed)  Labs Reviewed  CBC WITH DIFFERENTIAL/PLATELET - Abnormal; Notable for the following:    RBC 4.26 (*)    HCT 37.9 (*)    RDW 15.0 (*)      All other components within normal limits  COMPREHENSIVE METABOLIC PANEL - Abnormal; Notable for the following:    Glucose, Bld 307 (*)    Calcium 8.5 (*)    All other components within normal limits  TROPONIN I - Abnormal; Notable for the following:    Troponin I 0.03 (*)    All other components within normal limits  PROTIME-INR  APTT  TROPONIN I  TROPONIN I   ____________________________________________  EKG  ED ECG REPORT I, Gayla Doss, the attending physician, personally viewed and interpreted this ECG.   Date: 03/09/2016  EKG Time:08:40  Rate: 71  Rhythm: normal sinus rhythm  Axis: normal  Intervals:none  ST&T Change: No acute ST elevation or acute ST depression.  ____________________________________________  RADIOLOGY  CXR  IMPRESSION: Enlargement of cardiac silhouette.  No acute abnormalities. ____________________________________________   PROCEDURES  Procedure(s) performed: None  Procedures  Critical Care performed: No  ____________________________________________   INITIAL IMPRESSION / ASSESSMENT AND PLAN / ED COURSE  Pertinent labs & imaging results that were available during my care of the patient were reviewed by me and considered in my medical decision making (see chart for details).  [name] is a 65 y.o. male with coronary artery disease suspect NSTEMI and catheterization showing severe LAD and RCA disease in 11/2015 treated with DES, hypertension, hyperlipidemia, type 2 diabetes mellitus, and morbid obesity who presents for evaluation of less than one hour of nonradiating nonpleuritic central chest pressure that began while he was driving. His pain is improved significantly after EMS treatment, currently he is reporting just 1 out of 10 tightness in the central chest. He is generally well-appearing, in no acute distress, vital signs are stable and he is afebrile. EKG not consistent with any acute ischemia however  concern is for ACS  given prior similar presentations for same. we'll obtain screening labs, chest x-ray, treat his pain and anticipate admission. Pain is not ripping or tearing in nature, nonradiating, nonpleuritic, I doubt that this represents acute aortic dissection or pulmonary embolism.  ----------------------------------------- 10:25 AM on 03/09/2016 ----------------------------------------- Patient continues to be well-appearing, currently  chest pain-free. Chest x-ray shows no acute cardiopulmonary abnormality. Initial troponin 0.03, downtrending from prior. Unremarkable CBC and CMP. Case discussed with hospitalist, Dr. Hilton Sinclair, for admission at this time. ____________________________________________   FINAL CLINICAL IMPRESSION(S) / ED DIAGNOSES  Final diagnoses:  Chest pain, unspecified chest pain type      NEW MEDICATIONS STARTED DURING THIS VISIT:  New Prescriptions   No medications on file     Note:  This document was prepared using Dragon voice recognition software and may include unintentional dictation errors.    Gayla Doss, MD 03/09/16 1026

## 2016-03-10 ENCOUNTER — Other Ambulatory Visit: Payer: PPO

## 2016-03-10 ENCOUNTER — Encounter
Admission: EM | Disposition: A | Discharge status: Home / Self Care | Payer: Self-pay | Source: Home / Self Care | Attending: Internal Medicine

## 2016-03-10 DIAGNOSIS — E785 Hyperlipidemia, unspecified: Secondary | ICD-10-CM | POA: Diagnosis not present

## 2016-03-10 DIAGNOSIS — I214 Non-ST elevation (NSTEMI) myocardial infarction: Secondary | ICD-10-CM | POA: Diagnosis not present

## 2016-03-10 DIAGNOSIS — E119 Type 2 diabetes mellitus without complications: Secondary | ICD-10-CM | POA: Diagnosis not present

## 2016-03-10 DIAGNOSIS — I25118 Atherosclerotic heart disease of native coronary artery with other forms of angina pectoris: Secondary | ICD-10-CM | POA: Diagnosis not present

## 2016-03-10 DIAGNOSIS — I2510 Atherosclerotic heart disease of native coronary artery without angina pectoris: Secondary | ICD-10-CM

## 2016-03-10 DIAGNOSIS — I10 Essential (primary) hypertension: Secondary | ICD-10-CM | POA: Diagnosis not present

## 2016-03-10 HISTORY — PX: CARDIAC CATHETERIZATION: SHX172

## 2016-03-10 LAB — GLUCOSE, CAPILLARY
Glucose-Capillary: 209 mg/dL — ABNORMAL HIGH (ref 65–99)
Glucose-Capillary: 221 mg/dL — ABNORMAL HIGH (ref 65–99)
Glucose-Capillary: 237 mg/dL — ABNORMAL HIGH (ref 65–99)
Glucose-Capillary: 209 mg/dL — ABNORMAL HIGH (ref 65–99)

## 2016-03-10 LAB — CBC
HCT: 38.1 % — ABNORMAL LOW (ref 40.0–52.0)
Hemoglobin: 12.9 g/dL — ABNORMAL LOW (ref 13.0–18.0)
MCH: 30.4 pg (ref 26.0–34.0)
MCHC: 33.8 g/dL (ref 32.0–36.0)
MCV: 89.9 fL (ref 80.0–100.0)
Platelets: 195 10*3/uL (ref 150–440)
RBC: 4.24 MIL/uL — ABNORMAL LOW (ref 4.40–5.90)
RDW: 14.7 % — ABNORMAL HIGH (ref 11.5–14.5)
WBC: 7.7 10*3/uL (ref 3.8–10.6)

## 2016-03-10 LAB — ECHOCARDIOGRAM COMPLETE
Height: 67 in
Weight: 4830.4 oz

## 2016-03-10 LAB — TROPONIN I: Troponin I: 0.87 ng/mL (ref <0.03)

## 2016-03-10 LAB — BASIC METABOLIC PANEL
Anion gap: 6 (ref 5–15)
BUN: 22 mg/dL — ABNORMAL HIGH (ref 6–20)
CO2: 29 mmol/L (ref 22–32)
Calcium: 8.4 mg/dL — ABNORMAL LOW (ref 8.9–10.3)
Chloride: 106 mmol/L (ref 101–111)
Creatinine, Ser: 0.98 mg/dL (ref 0.61–1.24)
GFR calc Af Amer: >60 mL/min (ref >60)
GFR calc non Af Amer: >60 mL/min (ref >60)
Glucose, Bld: 136 mg/dL — ABNORMAL HIGH (ref 65–99)
Potassium: 3.5 mmol/L (ref 3.5–5.1)
Sodium: 141 mmol/L (ref 135–145)

## 2016-03-10 LAB — HEPARIN LEVEL (UNFRACTIONATED)
Heparin Unfractionated: 0.26 IU/mL — ABNORMAL LOW (ref 0.30–0.70)
Heparin Unfractionated: 0.25 IU/mL — ABNORMAL LOW (ref 0.30–0.70)

## 2016-03-10 SURGERY — LEFT HEART CATH AND CORONARY ANGIOGRAPHY
Anesthesia: Moderate Sedation

## 2016-03-10 MED ORDER — HEPARIN SODIUM (PORCINE) 1000 UNIT/ML IJ SOLN
INTRAMUSCULAR | Status: DC | PRN
  Administered 2016-03-10: 6000 [IU] via INTRAVENOUS

## 2016-03-10 MED ORDER — ISOSORBIDE MONONITRATE ER 30 MG PO TB24
30.0000 mg | ORAL_TABLET | Freq: Every day | ORAL | Status: DC
  Administered 2016-03-10 – 2016-03-11 (×2): 30 mg via ORAL
  Filled 2016-03-10 (×2): qty 1

## 2016-03-10 MED ORDER — FENTANYL CITRATE (PF) 100 MCG/2ML IJ SOLN
INTRAMUSCULAR | Status: AC
  Filled 2016-03-10: qty 2

## 2016-03-10 MED ORDER — MIDAZOLAM HCL 2 MG/2ML IJ SOLN
INTRAMUSCULAR | Status: AC
  Filled 2016-03-10: qty 2

## 2016-03-10 MED ORDER — HEPARIN BOLUS VIA INFUSION
1450.0000 [IU] | Freq: Once | INTRAVENOUS | Status: DC
  Filled 2016-03-10: qty 1450

## 2016-03-10 MED ORDER — FENTANYL CITRATE (PF) 100 MCG/2ML IJ SOLN
INTRAMUSCULAR | Status: DC | PRN
  Administered 2016-03-10: 50 ug via INTRAVENOUS

## 2016-03-10 MED ORDER — SODIUM CHLORIDE 0.9 % IV SOLN: 250.0000 mL | INTRAVENOUS | Status: DC | PRN

## 2016-03-10 MED ORDER — HEPARIN (PORCINE) IN NACL 2-0.9 UNIT/ML-% IJ SOLN
INTRAMUSCULAR | Status: AC
  Filled 2016-03-10: qty 500

## 2016-03-10 MED ORDER — IOPAMIDOL (ISOVUE-300) INJECTION 61%
INTRAVENOUS | Status: DC | PRN
  Administered 2016-03-10: 155 mL via INTRA_ARTERIAL

## 2016-03-10 MED ORDER — SODIUM CHLORIDE 0.9% FLUSH
3.0000 mL | Freq: Two times a day (BID) | INTRAVENOUS | Status: DC
  Administered 2016-03-10 – 2016-03-11 (×2): 3 mL via INTRAVENOUS

## 2016-03-10 MED ORDER — VERAPAMIL HCL 2.5 MG/ML IV SOLN
INTRAVENOUS | Status: AC
  Filled 2016-03-10: qty 2

## 2016-03-10 MED ORDER — OXYCODONE-ACETAMINOPHEN 5-325 MG PO TABS
1.0000 | ORAL_TABLET | Freq: Once | ORAL | Status: AC
Start: 2016-03-10 — End: 2016-03-10
  Administered 2016-03-10: 1 via ORAL
  Filled 2016-03-10: qty 1

## 2016-03-10 MED ORDER — MIDAZOLAM HCL 2 MG/2ML IJ SOLN
INTRAMUSCULAR | Status: DC | PRN
  Administered 2016-03-10: 1 mg via INTRAVENOUS

## 2016-03-10 MED ORDER — VERAPAMIL HCL 2.5 MG/ML IV SOLN
INTRAVENOUS | Status: DC | PRN
  Administered 2016-03-10: 2.5 mg via INTRA_ARTERIAL

## 2016-03-10 MED ORDER — HEPARIN BOLUS VIA INFUSION
1500.0000 [IU] | Freq: Once | INTRAVENOUS | Status: AC
  Administered 2016-03-10: 1500 [IU] via INTRAVENOUS
  Filled 2016-03-10: qty 1500

## 2016-03-10 MED ORDER — SODIUM CHLORIDE 0.9% FLUSH: 3.0000 mL | INTRAVENOUS | Status: DC | PRN

## 2016-03-10 MED ORDER — HEPARIN SODIUM (PORCINE) 1000 UNIT/ML IJ SOLN
INTRAMUSCULAR | Status: AC
  Filled 2016-03-10: qty 1

## 2016-03-10 MED ORDER — SODIUM CHLORIDE 0.9 % IV SOLN
INTRAVENOUS | Status: AC
  Administered 2016-03-10: 17:00:00 via INTRAVENOUS

## 2016-03-10 SURGICAL SUPPLY — 11 items
CATH INFINITI 5 FR JL3.5 (CATHETERS) ×2 IMPLANT
CATH INFINITI 5FR ANG PIGTAIL (CATHETERS) ×2 IMPLANT
CATH OPTITORQUE JACKY 4.0 5F (CATHETERS) ×2 IMPLANT
CATH VISTA GUIDE 6FR XBLAD3.5 (CATHETERS) ×2 IMPLANT
DEVICE RAD TR BAND REGULAR (VASCULAR PRODUCTS) ×2 IMPLANT
DRAPE BRACHIAL (DRAPES) ×2 IMPLANT
GLIDESHEATH SLEND SS 6F .021 (SHEATH) ×2 IMPLANT
KIT MANI 3VAL PERCEP (MISCELLANEOUS) ×2 IMPLANT
PACK CARDIAC CATH (CUSTOM PROCEDURE TRAY) ×2 IMPLANT
WIRE HITORQ VERSACORE ST 145CM (WIRE) ×2 IMPLANT
WIRE SAFE-T 1.5MM-J .035X260CM (WIRE) ×2 IMPLANT

## 2016-03-10 NOTE — Care Management (Signed)
Positive troponins, peaked at 2.14. NSTEMI- IV Heparin. For cardiac cath today. Following.

## 2016-03-10 NOTE — H&P (View-Only) (Signed)
Patient: [name] / Admit Date: 03/09/2016 / Date of Encounter: 03/10/2016, 7:23 AM   Subjective: He is for cardiac cath today at 1:30 Pm with Dr. Kirke Corin, MD. No acute overnight events. Had one episode of chest pain overnight that required one SL NTG with resolution of pain. No pain since.   Review of Systems: Review of Systems  Constitutional: Positive for malaise/fatigue. Negative for fever, chills, weight loss and diaphoresis.  HENT: Negative for congestion.   Eyes: Negative for discharge and redness.  Respiratory: Positive for shortness of breath. Negative for cough, hemoptysis, sputum production and wheezing.   Cardiovascular: Positive for chest pain and leg swelling. Negative for palpitations, orthopnea, claudication and PND.  Gastrointestinal: Negative for nausea, vomiting, blood in stool and melena.  Genitourinary: Negative for hematuria.  Musculoskeletal: Negative for falls.  Skin: Negative for rash.  Neurological: Negative for dizziness, sensory change, speech change, focal weakness, loss of consciousness and weakness.  Endo/Heme/Allergies: Does not bruise/bleed easily.  Psychiatric/Behavioral: The patient is not nervous/anxious.   All other systems reviewed and are negative.   Objective: Telemetry: NSR, 70's bpm Physical Exam: Blood pressure 138/63, pulse 68, temperature 97.9 F (36.6 C), temperature source Oral, resp. rate 18, height  (1.702 m), weight 301 lb 14.4 oz (136.941 kg), SpO2 95 %. Body mass index is 47.27 kg/(m^2). General: Well developed, well nourished, in no acute distress. Head: Normocephalic, atraumatic, sclera non-icteric, no xanthomas, nares are without discharge. Neck: Negative for carotid bruits. JVP not elevated. Lungs: Clear bilaterally to auscultation without wheezes, rales, or rhonchi. Breathing is unlabored. Heart: RRR S1 S2 without murmurs, rubs, or gallops.  Abdomen: Obese, soft, non-tender, non-distended with normoactive bowel  sounds. No rebound/guarding. Extremities: No clubbing or cyanosis. No edema. Distal pedal pulses are 2+ and equal bilaterally. Neuro: Alert and oriented X 3. Moves all extremities spontaneously. Psych:  Responds to questions appropriately with a normal affect.   Intake/Output Summary (Last 24 hours) at 03/10/16 0723 Last data filed at 03/10/16 0657  Gross per 24 hour  Intake      0 ml  Output    950 ml  Net   -950 ml    Inpatient Medications:  . amLODipine  5 mg Oral Daily  . aspirin  81 mg Oral Daily  . carvedilol  6.25 mg Oral BID WC  . ezetimibe  10 mg Oral Daily  . insulin aspart  0-5 Units Subcutaneous QHS  . insulin aspart  0-9 Units Subcutaneous TID WC  . insulin aspart protamine- aspart  45 Units Subcutaneous BID WC  . pravastatin  40 mg Oral q1800  . sodium chloride flush  3 mL Intravenous Q12H  . sodium chloride flush  3 mL Intravenous Q12H  . ticagrelor  90 mg Oral BID   Infusions:  . sodium chloride 1 mL/kg/hr (03/10/16 0711)  . heparin 1,450 Units/hr (03/10/16 1610)    Labs:  Recent Labs  03/09/16 0837 03/10/16 0455  NA 138 141  K 3.9 3.5  CL 104 106  CO2 27 29  GLUCOSE 307* 136*  BUN 20 22*  CREATININE 0.99 0.98  CALCIUM 8.5* 8.4*    Recent Labs  03/09/16 0837  AST 20  ALT 24  ALKPHOS 77  BILITOT 0.4  PROT 6.8  ALBUMIN 3.6    Recent Labs  03/09/16 0837 03/10/16 0455  WBC 5.9 7.7  NEUTROABS 4.1  --   HGB 13.1 12.9*  HCT 37.9* 38.1*  MCV 88.9 89.9  PLT  197 195    Recent Labs  03/09/16 0837 03/09/16 1259 03/09/16 1616  TROPONINI 0.03* 1.31* 2.14*   Invalid input(s): POCBNP No results for input(s): HGBA1C in the last 72 hours.   Weights: Filed Weights   03/09/16 0842 03/09/16 1136  Weight: 304 lb (137.893 kg) 301 lb 14.4 oz (136.941 kg)     Radiology/Studies:  Dg Chest Portable 1 View  03/09/2016  CLINICAL DATA:  Central chest pain shortness of breath for 1 day, coronary disease, diabetes mellitus, hypertension,  hyperlipidemia EXAM: PORTABLE CHEST 1 VIEW COMPARISON:  Portable exam 0844 hours compared to 12/24/2015 FINDINGS: Enlargement of cardiac silhouette. Mediastinal contours and pulmonary vascularity normal. Lungs clear. No pleural effusion or pneumothorax. Bones unremarkable. IMPRESSION: Enlargement of cardiac silhouette. No acute abnormalities. Electronically Signed   By: Ulyses SouthwardMark  Boles M.D.   On: 03/09/2016 09:01     Assessment and Plan  Principal Problem:   NSTEMI (non-ST elevated myocardial infarction) [facility](HCC) Active Problems:   CAD (coronary artery disease)   Unstable angina (HCC)   Uncontrolled diabetes mellitus with retinopathy (HCC)   Hypertensive heart disease   Hyperlipidemia LDL goal <70   Morbid obesity (HCC)   Essential hypertension   Statin intolerance    1.NSTEMI/CAD s/p recent PCI/DES to LAD in 11/2015: -Cardiac cath this afternoon with Dr. Kirke CorinArida, MD -Heparin gtt -Aspirin and Brilinta  -Coreg -Currently, without pain -Risks and benefits of cardiac catheterization have been discussed with the patient including risks of bleeding, bruising, infection, kidney damage, stroke, heart attack, and death. The patient understands these risks and is willing to proceed with the procedure. All questions have been answered and concerns listened to  2. Hypertensive heart disease: -Improved -Continue current antihypertensives   3. Uncontrolled IDDM: -Per IM  4. HLD: -Statin intolerant -Zetia  5. Morbid obesity: -Would benefit from cardiac rehab   Signed, Carola FrostRyan Kenedy Haisley, PA-C [facility] Pager: [phone] 03/10/2016, 7:23 AM

## 2016-03-10 NOTE — Progress Notes (Signed)
[facility] -  at [facility]   PATIENT NAME: William SierrasJohn Dosch    MR#:  [id number]  DATE OF BIRTH:  Jan 19, 1951  SUBJECTIVE:  CHIEF COMPLAINT:   Chief Complaint  Patient presents with  . Chest Pain  Patient is a 65 year old Caucasian male who is present, history significant for history of coronary artery disease status post cardiac catheterization in April 2017, status post proximal LAD stent in the setting of acute MI, history of hypertension, hyperlipidemia, diabetes, obesity, who presents to the hospital with complaints of chest pain in the sternal area, as intense as 10 out of 10 by intensity, accompanied by shortness of breath. On arrival to the hospital. Patient's troponin was found to be elevated at maximum 2.14 on the second set, then decreased to 0.87 on this third set. Patient denies any chest pains or any other abnormalities today. Cardiac catheterization as recommended by cardiologist. Patient is being managed on heparin intravenously, aspirin, Belinda, Coreg, Pravachol.  Review of Systems  Constitutional: Negative for fever, chills and weight loss.  HENT: Negative for congestion.   Eyes: Negative for blurred vision and double vision.  Respiratory: Negative for cough, sputum production, shortness of breath and wheezing.   Cardiovascular: Negative for chest pain, palpitations, orthopnea, leg swelling and PND.  Gastrointestinal: Negative for nausea, vomiting, abdominal pain, diarrhea, constipation and blood in stool.  Genitourinary: Negative for dysuria, urgency, frequency and hematuria.  Musculoskeletal: Negative for falls.  Neurological: Negative for dizziness, tremors, focal weakness and headaches.  Endo/Heme/Allergies: Does not bruise/bleed easily.  Psychiatric/Behavioral: Negative for depression. The patient does not have insomnia.     VITAL SIGNS: Blood pressure 142/64, pulse 59, temperature 98.1 F (36.7 C), temperature source Oral, resp. rate  18, height 5\' 7"  (1.702 m), weight 136.941 kg (301 lb 14.4 oz), SpO2 96 %.  PHYSICAL EXAMINATION:   GENERAL:  65 y.o.-year-old  obese patient sitting in this chair  with no acute distress.  EYES: Pupils equal, round, reactive to light and accommodation. No scleral icterus. Extraocular muscles intact.  HEENT: Head atraumatic, normocephalic. Oropharynx and nasopharynx clear.  NECK:  Supple, no jugular venous distention. No thyroid enlargement, no tenderness.  LUNGS: Normal breath sounds bilaterally, no wheezing, rales,rhonchi or crepitation. No use of accessory muscles of respiration.  CARDIOVASCULAR: S1, S2 normal. No murmurs, rubs, or gallops.  ABDOMEN: Soft, nontender, nondistended. Bowel sounds present. No organomegaly or mass.  EXTREMITIES: No pedal edema, cyanosis, or clubbing.  NEUROLOGIC: Cranial nerves II through XII are intact. Muscle strength 5/5 in all extremities. Sensation intact. Gait not checked.  PSYCHIATRIC: The patient is alert and oriented x 3.  SKIN: No obvious rash, lesion, or ulcer.   ORDERS/RESULTS REVIEWED:   CBC  Recent Labs Lab 03/09/16 0837 03/10/16 0455  WBC 5.9 7.7  HGB 13.1 12.9*  HCT 37.9* 38.1*  PLT 197 195  MCV 88.9 89.9  MCH 30.6 30.4  MCHC 34.5 33.8  RDW 15.0* 14.7*  LYMPHSABS 1.1  --   MONOABS 0.4  --   EOSABS 0.3  --   BASOSABS 0.0  --    ------------------------------------------------------------------------------------------------------------------  Chemistries   Recent Labs Lab 03/09/16 0837 03/10/16 0455  NA 138 141  K 3.9 3.5  CL 104 106  CO2 27 29  GLUCOSE 307* 136*  BUN 20 22*  CREATININE 0.99 0.98  CALCIUM 8.5* 8.4*  AST 20  --   ALT 24  --   ALKPHOS 77  --   BILITOT 0.4  --    ------------------------------------------------------------------------------------------------------------------  estimated creatinine clearance is 99 mL/min (by C-G formula based on Cr of  0.98). ------------------------------------------------------------------------------------------------------------------ No results for input(s): TSH, T4TOTAL, T3FREE, THYROIDAB in the last 72 hours.  Invalid input(s): FREET3  Cardiac Enzymes  Recent Labs Lab 03/09/16 1259 03/09/16 1616 03/10/16 1151  TROPONINI 1.31* 2.14* 0.87*   ------------------------------------------------------------------------------------------------------------------ Invalid input(s): POCBNP ---------------------------------------------------------------------------------------------------------------  RADIOLOGY: Dg Chest Portable 1 View  03/09/2016  CLINICAL DATA:  Central chest pain shortness of breath for 1 day, coronary disease, diabetes mellitus, hypertension, hyperlipidemia EXAM: PORTABLE CHEST 1 VIEW COMPARISON:  Portable exam 0844 hours compared to 12/24/2015 FINDINGS: Enlargement of cardiac silhouette. Mediastinal contours and pulmonary vascularity normal. Lungs clear. No pleural effusion or pneumothorax. Bones unremarkable. IMPRESSION: Enlargement of cardiac silhouette. No acute abnormalities. Electronically Signed   By: Ulyses Southward M.D.   On: 03/09/2016 09:01    EKG:  Orders placed or performed during the hospital encounter of 03/09/16  . ED EKG  . ED EKG  . EKG 12-Lead  . EKG 12-Lead    ASSESSMENT AND PLAN:  Principal Problem:   NSTEMI (non-ST elevated myocardial infarction) (HCC) Active Problems:   Uncontrolled diabetes mellitus with retinopathy (HCC)   Hyperlipidemia LDL goal <70   CAD (coronary artery disease)   Unstable angina (HCC)   Hypertensive heart disease   Morbid obesity (HCC)   Essential hypertension   Statin intolerance #1. Non-STEMI, continue patient on aspirin, Belinda, Coreg, Pravachol, IV heparin, cardiac enzymes are decreasing, which patient is chest pain-free. Cardiac catheterization as recommended by cardiologist, discharge planning as per cardiology  recommendations #2. Anemia, follow closely, no active bleeding noted #3. Diabetes mellitus type 2, most recent hemoglobin A1c was 8.1 in June 2017, continue patient on insulin 7030, patient may benefit from endocrinologist. Follow-up as outpatient, lifestyle Center education and follow-up #4. Hyperlipidemia, continue patient on Pravachol/Zetia, lipid panel in April 2017 showed a cholesterol level of 231, triglycerides 187, HDL 33 and LDL 161, now Pravachol is added to Zetia, follow as outpatient, patient needs to lose weight, get dietary involved for recommendations #5. Hypertension, continue Norvasc, Coreg, advance as needed  Management plans discussed with the patient, family and they are in agreement.   DRUG ALLERGIES:  Allergies  Allergen Reactions  . Atorvastatin     REACTION: Body aches    CODE STATUS:     Code Status Orders        Start     Ordered   03/09/16 1035  Full code   Continuous     03/09/16 1034    Code Status History    Date Active Date Inactive Code Status Order ID Comments User Context   12/25/2015  1:44 PM 12/26/2015  2:08 PM Full Code [id number]  Alwyn Pea, MD Inpatient   12/24/2015  6:14 PM 12/25/2015  1:44 PM Full Code [id number]  Enedina Finner, MD Inpatient      TOTAL TIME TAKING CARE OF THIS PATIENT: 40  minutes.    Katharina Caper M.D on 03/10/2016 at 12:39 PM  Between 7am to 6pm - Pager - [phone]  After 6pm go to [email or web address] - password [email or web address] [facility]  [facility]  Office  [phone]  CC: Primary care physician; Kerby Nora, MD

## 2016-03-10 NOTE — Progress Notes (Signed)
  Patient: [name] / Admit Date: 03/09/2016 / Date of Encounter: 03/10/2016, 7:23 AM   Subjective: He is for cardiac cath today at 1:30 Pm with Dr. Arida, MD. No acute overnight events. Had one episode of chest pain overnight that required one SL NTG with resolution of pain. No pain since.   Review of Systems: Review of Systems  Constitutional: Positive for malaise/fatigue. Negative for fever, chills, weight loss and diaphoresis.  HENT: Negative for congestion.   Eyes: Negative for discharge and redness.  Respiratory: Positive for shortness of breath. Negative for cough, hemoptysis, sputum production and wheezing.   Cardiovascular: Positive for chest pain and leg swelling. Negative for palpitations, orthopnea, claudication and PND.  Gastrointestinal: Negative for nausea, vomiting, blood in stool and melena.  Genitourinary: Negative for hematuria.  Musculoskeletal: Negative for falls.  Skin: Negative for rash.  Neurological: Negative for dizziness, sensory change, speech change, focal weakness, loss of consciousness and weakness.  Endo/Heme/Allergies: Does not bruise/bleed easily.  Psychiatric/Behavioral: The patient is not nervous/anxious.   All other systems reviewed and are negative.   Objective: Telemetry: NSR, 70's bpm Physical Exam: Blood pressure 138/63, pulse 68, temperature 97.9 F (36.6 C), temperature source Oral, resp. rate 18, height 5' 7" (1.702 m), weight 301 lb 14.4 oz (136.941 kg), SpO2 95 %. Body mass index is 47.27 kg/(m^2). General: Well developed, well nourished, in no acute distress. Head: Normocephalic, atraumatic, sclera non-icteric, no xanthomas, nares are without discharge. Neck: Negative for carotid bruits. JVP not elevated. Lungs: Clear bilaterally to auscultation without wheezes, rales, or rhonchi. Breathing is unlabored. Heart: RRR S1 S2 without murmurs, rubs, or gallops.  Abdomen: Obese, soft, non-tender, non-distended with normoactive bowel  sounds. No rebound/guarding. Extremities: No clubbing or cyanosis. No edema. Distal pedal pulses are 2+ and equal bilaterally. Neuro: Alert and oriented X 3. Moves all extremities spontaneously. Psych:  Responds to questions appropriately with a normal affect.   Intake/Output Summary (Last 24 hours) at 03/10/16 0723 Last data filed at 03/10/16 0657  Gross per 24 hour  Intake      0 ml  Output    950 ml  Net   -950 ml    Inpatient Medications:  . amLODipine  5 mg Oral Daily  . aspirin  81 mg Oral Daily  . carvedilol  6.25 mg Oral BID WC  . ezetimibe  10 mg Oral Daily  . insulin aspart  0-5 Units Subcutaneous QHS  . insulin aspart  0-9 Units Subcutaneous TID WC  . insulin aspart protamine- aspart  45 Units Subcutaneous BID WC  . pravastatin  40 mg Oral q1800  . sodium chloride flush  3 mL Intravenous Q12H  . sodium chloride flush  3 mL Intravenous Q12H  . ticagrelor  90 mg Oral BID   Infusions:  . sodium chloride 1 mL/kg/hr (03/10/16 0711)  . heparin 1,450 Units/hr (03/10/16 0638)    Labs:  Recent Labs  03/09/16 0837 03/10/16 0455  NA 138 141  K 3.9 3.5  CL 104 106  CO2 27 29  GLUCOSE 307* 136*  BUN 20 22*  CREATININE 0.99 0.98  CALCIUM 8.5* 8.4*    Recent Labs  03/09/16 0837  AST 20  ALT 24  ALKPHOS 77  BILITOT 0.4  PROT 6.8  ALBUMIN 3.6    Recent Labs  03/09/16 0837 03/10/16 0455  WBC 5.9 7.7  NEUTROABS 4.1  --   HGB 13.1 12.9*  HCT 37.9* 38.1*  MCV 88.9 89.9  PLT   197 195    Recent Labs  03/09/16 0837 03/09/16 1259 03/09/16 1616  TROPONINI 0.03* 1.31* 2.14*   Invalid input(s): POCBNP No results for input(s): HGBA1C in the last 72 hours.   Weights: Filed Weights   03/09/16 0842 03/09/16 1136  Weight: 304 lb (137.893 kg) 301 lb 14.4 oz (136.941 kg)     Radiology/Studies:  Dg Chest Portable 1 View  03/09/2016  CLINICAL DATA:  Central chest pain shortness of breath for 1 day, coronary disease, diabetes mellitus, hypertension,  hyperlipidemia EXAM: PORTABLE CHEST 1 VIEW COMPARISON:  Portable exam 0844 hours compared to 12/24/2015 FINDINGS: Enlargement of cardiac silhouette. Mediastinal contours and pulmonary vascularity normal. Lungs clear. No pleural effusion or pneumothorax. Bones unremarkable. IMPRESSION: Enlargement of cardiac silhouette. No acute abnormalities. Electronically Signed   By: Ulyses SouthwardMark  Boles M.D.   On: 03/09/2016 09:01     Assessment and Plan  Principal Problem:   NSTEMI (non-ST elevated myocardial infarction) [facility](HCC) Active Problems:   CAD (coronary artery disease)   Unstable angina (HCC)   Uncontrolled diabetes mellitus with retinopathy (HCC)   Hypertensive heart disease   Hyperlipidemia LDL goal <70   Morbid obesity (HCC)   Essential hypertension   Statin intolerance    1.NSTEMI/CAD s/p recent PCI/DES to LAD in 11/2015: -Cardiac cath this afternoon with Dr. Kirke CorinArida, MD -Heparin gtt -Aspirin and Brilinta  -Coreg -Currently, without pain -Risks and benefits of cardiac catheterization have been discussed with the patient including risks of bleeding, bruising, infection, kidney damage, stroke, heart attack, and death. The patient understands these risks and is willing to proceed with the procedure. All questions have been answered and concerns listened to  2. Hypertensive heart disease: -Improved -Continue current antihypertensives   3. Uncontrolled IDDM: -Per IM  4. HLD: -Statin intolerant -Zetia  5. Morbid obesity: -Would benefit from cardiac rehab   Signed, Carola FrostRyan Janssen Zee, PA-C [facility] Pager: [phone] 03/10/2016, 7:23 AM

## 2016-03-10 NOTE — Progress Notes (Signed)
ANTICOAGULATION CONSULT NOTE - Initial Consult  Pharmacy Consult for Heparin Indication: chest pain/ACS  Allergies  Allergen Reactions  . Atorvastatin     REACTION: Body aches    Patient Measurements: Height: 5\' 7"  (170.2 cm) Weight: (!) 301 lb 14.4 oz (136.941 kg) IBW/kg (Calculated) : 66.1 Heparin Dosing Weight: 98.2  Vital Signs: Temp: 98.1 F (36.7 C) (07/13 1201) Temp Source: Oral (07/13 1201) BP: 142/64 mmHg (07/13 1201) Pulse Rate: 59 (07/13 1201)  Labs:  Recent Labs  03/09/16 0837 03/09/16 1259 03/09/16 1616 03/09/16 2115 03/10/16 0455 03/10/16 1151  HGB 13.1  --   --   --  12.9*  --   HCT 37.9*  --   --   --  38.1*  --   PLT 197  --   --   --  195  --   APTT 27  --   --   --   --   --   LABPROT 13.2  --   --   --   --   --   INR 0.98  --   --   --   --   --   HEPARINUNFRC  --   --   --  0.38 0.26* 0.25*  CREATININE 0.99  --   --   --  0.98  --   TROPONINI 0.03* 1.31* 2.14*  --   --   --     Estimated Creatinine Clearance: 99 mL/min (by C-G formula based on Cr of 0.98).   Medical History: Past Medical History  Diagnosis Date  . Coronary artery disease, non-occlusive     a. cath 2002 with no sig CAD;  b. cath 2008 normal LM, LAD, LCx, p&dRCA 20-30%, PDA 30%; c.11/2015 NSTEMI/PCI: LM nl, LAD 95p (2.5x15 Xience DES), LCX nl, RCA 100p/m w/ L->R collats, EF 55-65%.  . Diabetes mellitus type 2, insulin dependent (HCC)   . Hyperlipemia   . Hypertensive heart disease   . Osteoarthritis   . Depression   . Kidney stones   . Morbid obesity (HCC)   . Back injury 02/2002    worker's comp  . Snoring     Medications:  Prescriptions prior to admission  Medication Sig Dispense Refill Last Dose  . amLODipine (NORVASC) 5 MG tablet Take 1 tablet (5 mg total) by mouth daily. 30 tablet 5 03/09/2016 at am  . aspirin 81 MG EC tablet Take 1 tablet (81 mg total) by mouth daily.   03/09/2016 at 0600  . carvedilol (COREG) 6.25 MG tablet Take 1 tablet (6.25 mg total) by  mouth 2 (two) times daily with a meal. (Patient taking differently: Take 6.25 mg by mouth daily. ) 60 tablet 5 03/09/2016 at 0600  . clopidogrel (PLAVIX) 75 MG tablet Take 1 tablet (75 mg total) by mouth daily with breakfast. 30 tablet 5 03/09/2016 at 0600  . diclofenac (VOLTAREN) 75 MG EC tablet Take 75 mg by mouth 2 (two) times daily.   03/09/2016 at am  . ezetimibe (ZETIA) 10 MG tablet Take 1 tablet (10 mg total) by mouth daily. 30 tablet 5 03/09/2016 at am  . insulin NPH-regular Human (NOVOLIN 70/30 RELION) (70-30) 100 UNIT/ML injection 80 units in the morning and 80 units at night. (Patient taking differently: Inject 70-80 Units into the skin See admin instructions. 80 units every morning and 70 units every evening) 40 mL 5 03/09/2016 at am  . nitroGLYCERIN (NITROSTAT) 0.4 MG SL tablet Place 1 tablet (0.4 mg total) under  the tongue every 5 (five) minutes as needed for chest pain. 20 tablet 2 PRN   Scheduled:  . amLODipine  5 mg Oral Daily  . aspirin  81 mg Oral Daily  . carvedilol  6.25 mg Oral BID WC  . ezetimibe  10 mg Oral Daily  . heparin  1,450 Units Intravenous Once  . insulin aspart  0-5 Units Subcutaneous QHS  . insulin aspart  0-9 Units Subcutaneous TID WC  . insulin aspart protamine- aspart  45 Units Subcutaneous BID WC  . oxyCODONE-acetaminophen  1 tablet Oral Once  . pravastatin  40 mg Oral q1800  . sodium chloride flush  3 mL Intravenous Q12H  . sodium chloride flush  3 mL Intravenous Q12H  . ticagrelor  90 mg Oral BID    Assessment: Pharmacy consulted to dose and monitor heparin therapy in this 65 year old woman with ACS.  Goal of Therapy:  HL goal of 0.3-0.7 Monitor platelets by anticoagulation protocol: Yes   Plan:  Patient is currently on Ticagrelor and Aspirin therapy; therefore will give a lower heparin bolus. Will give heparin bolus 2000 units x 1 and will start heparin gtt @ 1250 units/hr. HL ordered @ 21:00. Pharmacy to follow and adjust per consult.   7/12  21:00 heparin level 0.38. Recheck with AM labs to confirm.  7/13 AM heparin level 0.26. 1500 unit bolus and increase rate to 1450 units/hr. Recheck in 6 hours.  7/13 1151 HL 0.25  Bolus 1450 and increase rate to 1650. Spoke with RN- pt is about to leave for the cath lab at this time. Rates not adjusted. Will follow up after cath for future plan.    Laurene Footman Maccia 03/10/2016,12:24 PM

## 2016-03-10 NOTE — Progress Notes (Signed)
To cath lab via bed.

## 2016-03-10 NOTE — Progress Notes (Signed)
Pt returned from cath lab.  No distress on ra. Cardiac monitor in place, pt denies chest pain at this time.  IVF infusing well lt hand.  Dressing to rt radial wrist dry and intact. Denies need at this time.  CB in reach, SR up x 2.

## 2016-03-10 NOTE — Interval H&P Note (Signed)
Cath Lab Visit (complete for each Cath Lab visit)  Clinical Evaluation Leading to the Procedure:   ACS: Yes.    Non-ACS:    Anginal Classification: class IV  Anti-ischemic medical therapy: Maximal Therapy (2 or more classes of medications)  Non-Invasive Test Results: No non-invasive testing performed  Prior CABG: No previous CABG      History and Physical Interval Note:  03/10/2016 1:35 PM  William HuskJohn H Tally  has presented today for surgery, with the diagnosis of NSTEMI  The various methods of treatment have been discussed with the patient and family. After consideration of risks, benefits and other options for treatment, the patient has consented to  Procedure(s): Left Heart Cath and Coronary Angiography (N/A) as a surgical intervention .  [name] history has been reviewed, patient examined, no change in status, stable for surgery.  I have reviewed the patient's chart and labs.  Questions were answered to the patient's satisfaction.     Lorine BearsMuhammad Raymond Azure

## 2016-03-11 ENCOUNTER — Encounter: Payer: Self-pay | Admitting: Cardiovascular Disease

## 2016-03-11 DIAGNOSIS — I5032 Chronic diastolic (congestive) heart failure: Secondary | ICD-10-CM | POA: Diagnosis not present

## 2016-03-11 DIAGNOSIS — Z955 Presence of coronary angioplasty implant and graft: Secondary | ICD-10-CM | POA: Diagnosis not present

## 2016-03-11 DIAGNOSIS — I252 Old myocardial infarction: Secondary | ICD-10-CM | POA: Diagnosis not present

## 2016-03-11 DIAGNOSIS — Z794 Long term (current) use of insulin: Secondary | ICD-10-CM | POA: Diagnosis not present

## 2016-03-11 DIAGNOSIS — H5441 Blindness, right eye, normal vision left eye: Secondary | ICD-10-CM | POA: Diagnosis not present

## 2016-03-11 DIAGNOSIS — I214 Non-ST elevation (NSTEMI) myocardial infarction: Secondary | ICD-10-CM | POA: Diagnosis not present

## 2016-03-11 DIAGNOSIS — I25110 Atherosclerotic heart disease of native coronary artery with unstable angina pectoris: Secondary | ICD-10-CM | POA: Diagnosis not present

## 2016-03-11 DIAGNOSIS — D649 Anemia, unspecified: Secondary | ICD-10-CM | POA: Diagnosis not present

## 2016-03-11 DIAGNOSIS — E11319 Type 2 diabetes mellitus with unspecified diabetic retinopathy without macular edema: Secondary | ICD-10-CM | POA: Diagnosis not present

## 2016-03-11 DIAGNOSIS — Z79899 Other long term (current) drug therapy: Secondary | ICD-10-CM | POA: Diagnosis not present

## 2016-03-11 DIAGNOSIS — M1990 Unspecified osteoarthritis, unspecified site: Secondary | ICD-10-CM | POA: Diagnosis not present

## 2016-03-11 DIAGNOSIS — M7989 Other specified soft tissue disorders: Secondary | ICD-10-CM

## 2016-03-11 DIAGNOSIS — I10 Essential (primary) hypertension: Secondary | ICD-10-CM | POA: Diagnosis not present

## 2016-03-11 DIAGNOSIS — R9430 Abnormal result of cardiovascular function study, unspecified: Secondary | ICD-10-CM

## 2016-03-11 DIAGNOSIS — I110 Hypertensive heart disease with heart failure: Secondary | ICD-10-CM | POA: Diagnosis not present

## 2016-03-11 DIAGNOSIS — Z6841 Body Mass Index (BMI) 40.0 and over, adult: Secondary | ICD-10-CM | POA: Diagnosis not present

## 2016-03-11 DIAGNOSIS — Z7902 Long term (current) use of antithrombotics/antiplatelets: Secondary | ICD-10-CM | POA: Diagnosis not present

## 2016-03-11 DIAGNOSIS — F329 Major depressive disorder, single episode, unspecified: Secondary | ICD-10-CM | POA: Diagnosis not present

## 2016-03-11 DIAGNOSIS — E119 Type 2 diabetes mellitus without complications: Secondary | ICD-10-CM | POA: Diagnosis not present

## 2016-03-11 DIAGNOSIS — E1165 Type 2 diabetes mellitus with hyperglycemia: Secondary | ICD-10-CM | POA: Diagnosis not present

## 2016-03-11 DIAGNOSIS — E785 Hyperlipidemia, unspecified: Secondary | ICD-10-CM | POA: Diagnosis not present

## 2016-03-11 DIAGNOSIS — I2510 Atherosclerotic heart disease of native coronary artery without angina pectoris: Secondary | ICD-10-CM | POA: Diagnosis not present

## 2016-03-11 DIAGNOSIS — Z7982 Long term (current) use of aspirin: Secondary | ICD-10-CM | POA: Diagnosis not present

## 2016-03-11 DIAGNOSIS — R079 Chest pain, unspecified: Secondary | ICD-10-CM | POA: Diagnosis not present

## 2016-03-11 LAB — BASIC METABOLIC PANEL
Anion gap: 5 (ref 5–15)
BUN: 22 mg/dL — ABNORMAL HIGH (ref 6–20)
CO2: 28 mmol/L (ref 22–32)
Calcium: 8.4 mg/dL — ABNORMAL LOW (ref 8.9–10.3)
Chloride: 103 mmol/L (ref 101–111)
Creatinine, Ser: 1.01 mg/dL (ref 0.61–1.24)
GFR calc Af Amer: >60 mL/min (ref >60)
GFR calc non Af Amer: >60 mL/min (ref >60)
Glucose, Bld: 173 mg/dL — ABNORMAL HIGH (ref 65–99)
Potassium: 3.8 mmol/L (ref 3.5–5.1)
Sodium: 136 mmol/L (ref 135–145)

## 2016-03-11 LAB — TROPONIN I: Troponin I: 0.75 ng/mL (ref <0.03)

## 2016-03-11 LAB — GLUCOSE, CAPILLARY
Glucose-Capillary: 171 mg/dL — ABNORMAL HIGH (ref 65–99)
Glucose-Capillary: 228 mg/dL — ABNORMAL HIGH (ref 65–99)

## 2016-03-11 MED ORDER — FUROSEMIDE 20 MG PO TABS: 20.0000 mg | ORAL_TABLET | Freq: Every day | ORAL | Status: DC

## 2016-03-11 MED ORDER — POTASSIUM CHLORIDE CRYS ER 20 MEQ PO TBCR
20.0000 meq | EXTENDED_RELEASE_TABLET | Freq: Every day | ORAL | Status: DC
Start: 2016-03-11 — End: 2016-09-09

## 2016-03-11 MED ORDER — POTASSIUM CHLORIDE CRYS ER 20 MEQ PO TBCR
20.0000 meq | EXTENDED_RELEASE_TABLET | Freq: Every day | ORAL | Status: DC
  Administered 2016-03-11: 20 meq via ORAL
  Filled 2016-03-11: qty 1

## 2016-03-11 MED ORDER — LORAZEPAM 1 MG PO TABS
1.0000 mg | ORAL_TABLET | Freq: Once | ORAL | Status: AC
  Administered 2016-03-11: 1 mg via ORAL
  Filled 2016-03-11: qty 1

## 2016-03-11 MED ORDER — FUROSEMIDE 20 MG PO TABS
20.0000 mg | ORAL_TABLET | Freq: Every day | ORAL | Status: DC
  Administered 2016-03-11: 20 mg via ORAL
  Filled 2016-03-11: qty 1

## 2016-03-11 MED ORDER — PRAVASTATIN SODIUM 40 MG PO TABS: 40.0000 mg | ORAL_TABLET | Freq: Every day | ORAL | Status: DC

## 2016-03-11 MED ORDER — TICAGRELOR 90 MG PO TABS: 90.0000 mg | ORAL_TABLET | Freq: Two times a day (BID) | ORAL | Status: DC

## 2016-03-11 MED ORDER — ISOSORBIDE MONONITRATE ER 30 MG PO TB24: 30.0000 mg | ORAL_TABLET | Freq: Every day | ORAL | Status: DC

## 2016-03-11 NOTE — Progress Notes (Signed)
Pt complaining of smothering feeling when he lays down. O2 saturations are 97, lung sounds are clear. MD Sheryle Hailiamond made aware. No new orders at this time. Will continue to monitor.   [name], [name]

## 2016-03-11 NOTE — Care Management (Signed)
Case discussed with attending. No home health needs. Brillinta coupon given.

## 2016-03-11 NOTE — Progress Notes (Signed)
  RD consulted for nutrition education regarding diabetes and heart healthy eating  RD provided "Plate Method for Balanced Meal Plan" handout. Discussed different food groups and their effects on blood sugar, emphasizing carbohydrate-containing foods. Provided list of carbohydrates and recommended serving sizes of common foods.  Discussed importance of controlled and consistent carbohydrate intake throughout the day. Provided examples of ways to balance meals/snacks and encouraged intake of high-fiber, whole grain complex carbohydrates. Teach back method used.  RD also provided "Heart Healthy Nutrition Therapy" handout from the Academy of Nutrition and Dietetics. Reviewed patient's dietary recall. Provided examples on ways to decrease sodium and fat intake in diet. Discouraged intake of processed foods and use of salt shaker. Encouraged fresh fruits and vegetables as well as whole grain sources of carbohydrates to maximize fiber intake. Teach back method used.  Expect fair compliance.  Body mass index is 47.27 kg/(m^2). Pt meets criteria for morbid obesity based on current BMI.  Current diet order is heart healthy/carb modified, patient is consuming approximately 100% of meals at this time. Labs and medications reviewed. No further nutrition interventions warranted at this time. RD contact information provided. If additional nutrition issues arise, please re-consult RD.  Jansen Goodpasture B. Freida BusmanAllen, RD, LDN [phone] (pager) Weekend/On-Call pager [phone])

## 2016-03-11 NOTE — Progress Notes (Signed)
Inpatient Diabetes Program Recommendations  AACE/ADA: New Consensus Statement on Inpatient Glycemic Control (2015)  Target Ranges:  Prepandial:   less than 140 mg/dL      Peak postprandial:   less than 180 mg/dL (1-2 hours)      Critically ill patients:  140 - 180 mg/dL   Lab Results  Component Value Date   GLUCAP 171* 03/11/2016   HGBA1C 8.1* 02/16/2016    Review of Glycemic Control Results for [name], Bravlio H (MRN [id number]) as of 03/11/2016 09:53  Ref. Range 03/10/2016 07:19 03/10/2016 11:59 03/10/2016 16:38 03/10/2016 21:02 03/11/2016 07:39  Glucose-Capillary Latest Ref Range: 65-99 mg/dL 469209 (H) 629221 (H) 528237 (H) 209 (H) 171 (H)    Home medications-  Novolin 70/30 80units QAM, 70 units QPM, Byetta 5 mgc BID (does not take Byetta). Inpatient medications:Novolog 70/30 45units BID, Novolog 0-9units AC, 0-5units HS     Susette RacerJulie Raylee Strehl, RN, [place], [place]HA, CDE Diabetes Coordinator Inpatient Diabetes Program  [phone] (Team Pager) [phone] [facility] Office) 03/23/2016 10:07 AM

## 2016-03-11 NOTE — Discharge Summary (Signed)
[facility] [place] at [facility]   PATIENT NAME: [name]    MR#:  [id number]  DATE OF BIRTH:  [birth year]  DATE OF ADMISSION:  03/09/2016 ADMITTING PHYSICIAN: Alford Highlandichard Wieting, MD  DATE OF DISCHARGE: No discharge date for patient encounter.  PRIMARY CARE PHYSICIAN: Kerby NoraAmy Bedsole, MD     ADMISSION DIAGNOSIS:  Chest pain with moderate risk for cardiac etiology [R07.9] Chest pain, unspecified chest pain type [R07.9]  DISCHARGE DIAGNOSIS:  Principal Problem:   NSTEMI (non-ST elevated myocardial infarction) (HCC) Active Problems:   Unstable angina (HCC)   Uncontrolled diabetes mellitus with retinopathy (HCC)   Hyperlipidemia LDL goal <70   Hypertensive heart disease   Morbid obesity (HCC)   Essential hypertension   Elevated left ventricular end-diastolic pressure (LVEDP)   Leg swelling   CAD (coronary artery disease)   Statin intolerance   Anemia   SECONDARY DIAGNOSIS:   Past Medical History  Diagnosis Date  . Coronary artery disease, non-occlusive     a. cath 2002 with no sig CAD;  b. cath 2008 normal LM, LAD, LCx, p&dRCA 20-30%, PDA 30%; c.11/2015 NSTEMI/PCI: LM nl, LAD 95p (2.5x15 Xience DES), LCX nl, RCA 100p/m w/ L->R collats, EF 55-65%.  . Diabetes mellitus type 2, insulin dependent (HCC)   . Hyperlipemia   . Hypertensive heart disease   . Osteoarthritis   . Depression   . Kidney stones   . Morbid obesity (HCC)   . Back injury 02/2002    worker's comp  . Snoring     .pro HOSPITAL COURSE:   Patient is a 65 year old Caucasian male who is present, history significant for history of coronary artery disease status post cardiac catheterization in April 2017, status post proximal LAD stent in the setting of acute MI, history of hypertension, hyperlipidemia, diabetes, obesity, who presents to the hospital with complaints of chest pain in the sternal area, as intense as 10 out of 10 by intensity, accompanied by shortness of breath. On arrival  to the hospital. Patient's troponin was found to be elevated at maximum 2.14 on the second set, then decreased to 0.87 on this third set. Patient denies any chest pains or any other abnormalities today. Cardiac catheterization was recommended by cardiologist. Patient was initially managed on heparin intravenously, aspirin, Belinda, Coreg, Pravachol. The patient underwent cardiac catheterization, 03/10/2016 by Dr. Kirke CorinArida, it revealed significant two-vessel coronary artery disease with known chronically occluded RCA, left to right collaterals, patent proximal LAD stent with minimal in-stent stenosis. Normal left ventricular systolic function with moderately elevated left ventricular end-diastolic pressure at 23 mmHg. Patient exhibited orthopnea, was noted to have lower extremity swelling. He was recommended to be initiated on Imdur, Lasix, Brilinta instead of Plavix. He was felt to be stable to be discharged home Discussion by problem: #1. Non-STEMI, continue patient on aspirin, Brilinta, Coreg, Pravachol, zetia, status post cardiac catheterization, 03/10/2016 by Dr. Kirke CorinArida revealing significant two-vessel coronary artery disease, occluded RCA, collaterals, patent proximal LAD stent, cardiologist, felt that non- STEMI was due to demand ischemia, cardiac rehabilitation was recommended and follow-up with cardiologist as outpatient #2. Anemia, follow closely as outpatient, no active bleeding noted #3. Diabetes mellitus type 2, most recent hemoglobin A1c was 8.1 in June 2017, continue patient on insulin 70/30, patient may benefit from endocrinologist follow-up as outpatient, lifestyle Center education and follow-up, blood glucose levels were ranging between 170-240 in the hospital #4. Hyperlipidemia, continue patient on Pravachol/Zetia, lipid panel in April 2017 showed a cholesterol level of 231,  triglycerides 187, HDL 33 and LDL 161, now Pravachol was added to Zetia, follow as outpatient, patient was advised to lose  weight, dietary was involved  #5. Hypertension, continue Norvasc, Coreg, advance as needed , blood pressure is relatively well controlled currently #6. Elevated left ventricular end-diastolic pressure, Lasix and Imdur was initiated, follow-up as outpatient #7. Lower extremity swelling, compression stockings were recommended, discussed this family, they're agreeable.   DISCHARGE CONDITIONS:   Good  CONSULTS OBTAINED:  Treatment Team:  Antonieta Iba, MD Almond Lint, MD  DRUG ALLERGIES:   Allergies  Allergen Reactions  . Atorvastatin     REACTION: Body aches    DISCHARGE MEDICATIONS:   Current Discharge Medication List    START taking these medications   Details  furosemide (LASIX) 20 MG tablet Take 1 tablet (20 mg total) by mouth daily. Qty: 30 tablet, Refills: 5    isosorbide mononitrate (IMDUR) 30 MG 24 hr tablet Take 1 tablet (30 mg total) by mouth daily. Qty: 30 tablet, Refills: 5    potassium chloride SA (K-DUR,KLOR-CON) 20 MEQ tablet Take 1 tablet (20 mEq total) by mouth daily. Qty: 30 tablet, Refills: 5    pravastatin (PRAVACHOL) 40 MG tablet Take 1 tablet (40 mg total) by mouth daily at 6 PM. Qty: 30 tablet, Refills: 5    ticagrelor (BRILINTA) 90 MG TABS tablet Take 1 tablet (90 mg total) by mouth 2 (two) times daily. Qty: 60 tablet, Refills: 5      CONTINUE these medications which have NOT CHANGED   Details  amLODipine (NORVASC) 5 MG tablet Take 1 tablet (5 mg total) by mouth daily. Qty: 30 tablet, Refills: 5    aspirin 81 MG EC tablet Take 1 tablet (81 mg total) by mouth daily.    carvedilol (COREG) 6.25 MG tablet Take 1 tablet (6.25 mg total) by mouth 2 (two) times daily with a meal. Qty: 60 tablet, Refills: 5    ezetimibe (ZETIA) 10 MG tablet Take 1 tablet (10 mg total) by mouth daily. Qty: 30 tablet, Refills: 5    insulin NPH-regular Human (NOVOLIN 70/30 RELION) (70-30) 100 UNIT/ML injection 80 units in the morning and 80 units at night. Qty:  40 mL, Refills: 5    nitroGLYCERIN (NITROSTAT) 0.4 MG SL tablet Place 1 tablet (0.4 mg total) under the tongue every 5 (five) minutes as needed for chest pain. Qty: 20 tablet, Refills: 2      STOP taking these medications     clopidogrel (PLAVIX) 75 MG tablet      diclofenac (VOLTAREN) 75 MG EC tablet          DISCHARGE INSTRUCTIONS:    Patient is to follow-up with primary care physician, cardiologist as outpatient, would benefit from endocrinology, lifestyle follow-up, cardiac rehabilitation. The patient needs to lose weight  If you experience worsening of your admission symptoms, develop shortness of breath, life threatening emergency, suicidal or homicidal thoughts you must seek medical attention immediately by calling 911 or calling your MD immediately  if symptoms less severe.  You Must read complete instructions/literature along with all the possible adverse reactions/side effects for all the Medicines you take and that have been prescribed to you. Take any new Medicines after you have completely understood and accept all the possible adverse reactions/side effects.   Please note  You were cared for by a hospitalist during your hospital stay. If you have any questions about your discharge medications or the care you received while you were in the  hospital after you are discharged, you can call the unit and asked to speak with the hospitalist on call if the hospitalist that took care of you is not available. Once you are discharged, your primary care physician will handle any further medical issues. Please note that NO REFILLS for any discharge medications will be authorized once you are discharged, as it is imperative that you return to your primary care physician (or establish a relationship with a primary care physician if you do not have one) for your aftercare needs so that they can reassess your need for medications and monitor your lab values.    Today   CHIEF COMPLAINT:    Chief Complaint  Patient presents with  . Chest Pain    HISTORY OF PRESENT ILLNESS:  Dodge Ator  is a 65 y.o. male with a known history of coronary artery disease status post cardiac catheterization in April 2017, status post proximal LAD stent in the setting of acute MI, history of hypertension, hyperlipidemia, diabetes, obesity, who presents to the hospital with complaints of chest pain in the sternal area, as intense as 10 out of 10 by intensity, accompanied by shortness of breath. On arrival to the hospital. Patient's troponin was found to be elevated at maximum 2.14 on the second set, then decreased to 0.87 on this third set. Patient denies any chest pains or any other abnormalities today. Cardiac catheterization was recommended by cardiologist. Patient was initially managed on heparin intravenously, aspirin, Belinda, Coreg, Pravachol. The patient underwent cardiac catheterization, 03/10/2016 by Dr. Kirke Corin, it revealed significant two-vessel coronary artery disease with known chronically occluded RCA, left to right collaterals, patent proximal LAD stent with minimal in-stent stenosis. Normal left ventricular systolic function with moderately elevated left ventricular end-diastolic pressure at 23 mmHg. Patient exhibited orthopnea, was noted to have lower extremity swelling. He was recommended to be initiated on Imdur, Lasix, Brilinta instead of Plavix. He was felt to be stable to be discharged home Discussion by problem: #1. Non-STEMI, continue patient on aspirin, Brilinta, Coreg, Pravachol, zetia, status post cardiac catheterization, 03/10/2016 by Dr. Kirke Corin revealing significant two-vessel coronary artery disease, occluded RCA, collaterals, patent proximal LAD stent, cardiologist, felt that non- STEMI was due to demand ischemia, cardiac rehabilitation was recommended and follow-up with cardiologist as outpatient #2. Anemia, follow closely as outpatient, no active bleeding noted #3. Diabetes  mellitus type 2, most recent hemoglobin A1c was 8.1 in June 2017, continue patient on insulin 70/30, patient may benefit from endocrinologist follow-up as outpatient, lifestyle Center education and follow-up, blood glucose levels were ranging between 170-240 in the hospital #4. Hyperlipidemia, continue patient on Pravachol/Zetia, lipid panel in April 2017 showed a cholesterol level of 231, triglycerides 187, HDL 33 and LDL 161, now Pravachol was added to Zetia, follow as outpatient, patient was advised to lose weight, dietary was involved  #5. Hypertension, continue Norvasc, Coreg, advance as needed , blood pressure is relatively well controlled currently #6. Elevated left ventricular end-diastolic pressure, Lasix and Imdur was initiated, follow-up as outpatient #7. Lower extremity swelling, compression stockings were recommended, discussed this family, they're agreeable.   VITAL SIGNS:  Blood pressure 127/52, pulse 64, temperature 97.4 F (36.3 C), temperature source Oral, resp. rate 20, height 5\' 7"  (1.702 m), weight 136.941 kg (301 lb 14.4 oz), SpO2 100 %.  I/O:   Intake/Output Summary (Last 24 hours) at 03/11/16 1124 Last data filed at 03/11/16 0740  Gross per 24 hour  Intake 1366.21 ml  Output   1550 ml  Net -183.79 ml    PHYSICAL EXAMINATION:  GENERAL:  64 y.o.-year-old patient lying in the bed with no acute distress.  EYES: Pupils equal, round, reactive to light and accommodation. No scleral icterus. Extraocular muscles intact.  HEENT: Head atraumatic, normocephalic. Oropharynx and nasopharynx clear.  NECK:  Supple, no jugular venous distention. No thyroid enlargement, no tenderness.  LUNGS: Normal breath sounds bilaterally, no wheezing, rales,rhonchi or crepitation. No use of accessory muscles of respiration.  CARDIOVASCULAR: S1, S2 normal. No murmurs, rubs, or gallops.  ABDOMEN: Soft, non-tender, non-distended. Bowel sounds present. No organomegaly or mass.  EXTREMITIES: 1+  lower extremity and pedal edema, no cyanosis, or clubbing.  NEUROLOGIC: Cranial nerves II through XII are intact. Muscle strength 5/5 in all extremities. Sensation intact. Gait not checked.  PSYCHIATRIC: The patient is alert and oriented x 3.  SKIN: No obvious rash, lesion, or ulcer.   DATA REVIEW:   CBC  Recent Labs Lab 03/10/16 0455  WBC 7.7  HGB 12.9*  HCT 38.1*  PLT 195    Chemistries   Recent Labs Lab 03/09/16 0837  03/11/16 0445  NA 138  < > 136  K 3.9  < > 3.8  CL 104  < > 103  CO2 27  < > 28  GLUCOSE 307*  < > 173*  BUN 20  < > 22*  CREATININE 0.99  < > 1.01  CALCIUM 8.5*  < > 8.4*  AST 20  --   --   ALT 24  --   --   ALKPHOS 77  --   --   BILITOT 0.4  --   --   < > = values in this interval not displayed.  Cardiac Enzymes  Recent Labs Lab 03/11/16 0445  TROPONINI 0.75*    Microbiology Results  No results found for this or any previous visit.  RADIOLOGY:  No results found.  EKG:   Orders placed or performed during the hospital encounter of 03/09/16  . ED EKG  . ED EKG  . EKG 12-Lead  . EKG 12-Lead      Management plans discussed with the patient, family and they are in agreement.  CODE STATUS:     Code Status Orders        Start     Ordered   03/09/16 1035  Full code   Continuous     03/09/16 1034    Code Status History    Date Active Date Inactive Code Status Order ID Comments User Context   12/25/2015  1:44 PM 12/26/2015  2:08 PM Full Code [id number]  Alwyn Pea, MD Inpatient   12/24/2015  6:14 PM 12/25/2015  1:44 PM Full Code [id number]  Enedina Finner, MD Inpatient      TOTAL TIME TAKING CARE OF THIS PATIENT: 40 minutes.    Katharina Caper M.D on 03/11/2016 at 11:24 AM  Between 7am to 6pm - Pager - [phone]  After 6pm go to [email or web address] - password [email or web address] [facility]  [place] [facility]  Office  [phone]  CC: Primary care physician; Kerby Nora, MD

## 2016-03-11 NOTE — Progress Notes (Signed)
Patient is alert and oriented, independent, on room air, denies pain, reports dyspnea following taking Brilinta, patient told cardiologist PA Ryan and Dr. Winona LegatoVaickute notified via nurse, patient assured that this is a common symptom with taking Brilinta and will resolve with time. Pt is d/c to home and instructed to f/u with pcp and cardiologist, RX sent to [facility] in [place], patient's daughter at bedside during physician rounding, pt reports understanding of d/c instructions and has no further questions at this time. Incision site in Right radial is clean, dry, and intact. Pt pushed to visitor entrance via volunteer staff. Vital signs stable, uneventful shift.

## 2016-03-11 NOTE — Progress Notes (Signed)
Patient: [name] / Admit Date: 03/09/2016 / Date of Encounter: 03/11/2016, 7:12 AM   Subjective: Cardiac cath on 7/13 showed significant 2-vessel CAD with known chronically occluded RCA with left-to-right collaterals. Patent proximal LAD stent with mild ISR. Normal LVSF with moderately elevated LVEDP at 23 mm Hg. Orthopneic overnight. No orders placed at that time. Currently, SOB has resolved and he is laying flat in bed without issues.   Review of Systems: Review of Systems  Constitutional: Positive for malaise/fatigue. Negative for fever, chills, weight loss and diaphoresis.  HENT: Negative for congestion.   Eyes: Negative for discharge and redness.  Respiratory: Positive for shortness of breath. Negative for cough, hemoptysis, sputum production and wheezing.   Cardiovascular: Positive for orthopnea and leg swelling. Negative for chest pain, palpitations, claudication and PND.  Gastrointestinal: Negative for nausea and vomiting.  Musculoskeletal: Negative for falls.  Skin: Negative for rash.  Neurological: Negative for dizziness, sensory change, speech change, focal weakness, loss of consciousness and weakness.  Endo/Heme/Allergies: Does not bruise/bleed easily.  Psychiatric/Behavioral: The patient is not nervous/anxious.   All other systems reviewed and are negative.   Objective: Telemetry: NSR, 60's bpm Physical Exam: Blood pressure 162/74, pulse 63, temperature 98 F (36.7 C), temperature source Oral, resp. rate 16, height  (1.702 m), weight 301 lb 14.4 oz (136.941 kg), SpO2 99 %. Body mass index is 47.27 kg/(m^2). General: Well developed, well nourished, in no acute distress. Head: Normocephalic, atraumatic, sclera non-icteric, no xanthomas, nares are without discharge. Neck: Negative for carotid bruits. JVP not elevated. Lungs: Clear bilaterally to auscultation without wheezes, rales, or rhonchi. Breathing is unlabored. Heart: RRR S1 S2 without murmurs, rubs, or  gallops.  Abdomen: Obese, soft, non-tender, non-distended with normoactive bowel sounds. No rebound/guarding. Extremities: No clubbing or cyanosis. No edema. Distal pedal pulses are 2+ and equal bilaterally. Cath site without bleeding, bruising, swelling, erythema, or TTP.  Neuro: Alert and oriented X 3. Moves all extremities spontaneously. Psych:  Responds to questions appropriately with a normal affect.   Intake/Output Summary (Last 24 hours) at 03/11/16 0454 Last data filed at 03/11/16 0115  Gross per 24 hour  Intake 1166.21 ml  Output   1675 ml  Net -508.79 ml    Inpatient Medications:  . amLODipine  5 mg Oral Daily  . aspirin  81 mg Oral Daily  . carvedilol  6.25 mg Oral BID WC  . ezetimibe  10 mg Oral Daily  . insulin aspart  0-5 Units Subcutaneous QHS  . insulin aspart  0-9 Units Subcutaneous TID WC  . insulin aspart protamine- aspart  45 Units Subcutaneous BID WC  . isosorbide mononitrate  30 mg Oral Daily  . pravastatin  40 mg Oral q1800  . sodium chloride flush  3 mL Intravenous Q12H  . sodium chloride flush  3 mL Intravenous Q12H  . ticagrelor  90 mg Oral BID   Infusions:    Labs:  Recent Labs  03/09/16 0837 03/10/16 0455  NA 138 141  K 3.9 3.5  CL 104 106  CO2 27 29  GLUCOSE 307* 136*  BUN 20 22*  CREATININE 0.99 0.98  CALCIUM 8.5* 8.4*    Recent Labs  03/09/16 0837  AST 20  ALT 24  ALKPHOS 77  BILITOT 0.4  PROT 6.8  ALBUMIN 3.6    Recent Labs  03/09/16 0837 03/10/16 0455  WBC 5.9 7.7  NEUTROABS 4.1  --   HGB 13.1 12.9*  HCT 37.9* 38.1*  MCV 88.9 89.9  PLT 197 195    Recent Labs  03/09/16 1259 03/09/16 1616 03/10/16 1151 03/11/16 0445  TROPONINI 1.31* 2.14* 0.87* 0.75*   Invalid input(s): POCBNP No results for input(s): HGBA1C in the last 72 hours.   Weights: Filed Weights   03/09/16 0842 03/09/16 1136  Weight: 304 lb (137.893 kg) 301 lb 14.4 oz (136.941 kg)     Radiology/Studies:  Dg Chest Portable 1  View  03/09/2016  CLINICAL DATA:  Central chest pain shortness of breath for 1 day, coronary disease, diabetes mellitus, hypertension, hyperlipidemia EXAM: PORTABLE CHEST 1 VIEW COMPARISON:  Portable exam 0844 hours compared to 12/24/2015 FINDINGS: Enlargement of cardiac silhouette. Mediastinal contours and pulmonary vascularity normal. Lungs clear. No pleural effusion or pneumothorax. Bones unremarkable. IMPRESSION: Enlargement of cardiac silhouette. No acute abnormalities. Electronically Signed   By: Ulyses SouthwardMark  Boles M.D.   On: 03/09/2016 09:01     Assessment and Plan  Principal Problem:   NSTEMI (non-ST elevated myocardial infarction) [facility](HCC) Active Problems:   CAD (coronary artery disease)   Unstable angina (HCC)   Uncontrolled diabetes mellitus with retinopathy (HCC)   Hypertensive heart disease   Hyperlipidemia LDL goal <70   Morbid obesity (HCC)   Essential hypertension   Statin intolerance    1. NSTEMI/Demand ischemia/CAD s/p recent PCI in 11/2015: -No culprit for NSTEMI by cardiac cath on 03/10/16 -Possibly demand ischemia in the setting of the patient's chronically occluded RCA with underlying hypertensive heart disease and moderately elevated LVEDP -Breathing is improved this morning -SOB possibly multifactorial including elevated LVEDP, recently starting Brilinta, morbid obesity and possible OHS, and deconditioning  -Continue Brilinta for now and see how his breathing progresses as the adverse effect associated with Brilinta generally improves in a couple of weeks -Start low-dose Lasix 20 mg daily (no previous diuretic) -bmet pending this morning  -Continue DAPT with aspirin 81 mg and Brilinta 90 mg bid -Continue Coreg -Imdur added on 7/13, continue  -Cardiac rehab  2. Hypertensive heart disease:  -As above -Continue Coreg  3. Uncontrolled IDDM: -Per IM  4. HLD: -Intolerant to statins -On Low-dose pravastatin as a trial -Zetia -Healthy diet advised  5. Morbid  obesity/possibel OHS: -Weight loss advised -Cardiac rehab as above   Signed, Eula Listenyan Eyad Rochford, PA-C [facility] Pager: [phone] 03/11/2016, 7:12 AM

## 2016-03-14 ENCOUNTER — Telehealth: Payer: Self-pay

## 2016-03-14 NOTE — Telephone Encounter (Signed)
Transition Care Management Follow-up Telephone Call - Norvel Richardsulliam, Kevin M.    Date discharged? 03/11/2016   How have you been since you were released from the hospital? Health status improving   Do you understand why you were in the hospital? Yes   Do you understand the discharge instructions? Yes   Where were you discharged to? Home   Items Reviewed:  Medications reviewed: Yes  Allergies reviewed: Yes  Dietary changes reviewed:  N/A  Referrals reviewed: N/A   Functional Questionnaire:   Activities of Daily Living (ADLs):   He states they are independent in the following: independent with ADLs States they require assistance with the following: none   Any transportation issues/concerns?: NO, pt drives   Any patient concerns? NO   Confirmed importance and date/time of follow-up visits scheduled YES  Provider Appointment booked with Dr. Ermalene SearingBedsole on 03/24/16 @ 1500  Confirmed with patient if condition begins to worsen call PCP or go to the ER.  Patient was given the office number and encouraged to call back with question or concerns.  : YES

## 2016-03-24 ENCOUNTER — Ambulatory Visit (INDEPENDENT_AMBULATORY_CARE_PROVIDER_SITE_OTHER): Payer: PPO | Admitting: Family Medicine

## 2016-03-24 ENCOUNTER — Encounter: Payer: Self-pay | Admitting: Family Medicine

## 2016-03-24 VITALS — BP 120/60 | HR 72 | Temp 98.2°F | Ht 66.0 in | Wt 298.8 lb

## 2016-03-24 DIAGNOSIS — Z794 Long term (current) use of insulin: Secondary | ICD-10-CM

## 2016-03-24 DIAGNOSIS — I214 Non-ST elevation (NSTEMI) myocardial infarction: Secondary | ICD-10-CM | POA: Diagnosis not present

## 2016-03-24 DIAGNOSIS — E1165 Type 2 diabetes mellitus with hyperglycemia: Secondary | ICD-10-CM

## 2016-03-24 DIAGNOSIS — E785 Hyperlipidemia, unspecified: Secondary | ICD-10-CM

## 2016-03-24 DIAGNOSIS — M7989 Other specified soft tissue disorders: Secondary | ICD-10-CM

## 2016-03-24 DIAGNOSIS — E1159 Type 2 diabetes mellitus with other circulatory complications: Secondary | ICD-10-CM | POA: Diagnosis not present

## 2016-03-24 DIAGNOSIS — I200 Unstable angina: Secondary | ICD-10-CM | POA: Diagnosis not present

## 2016-03-24 DIAGNOSIS — IMO0002 Reserved for concepts with insufficient information to code with codable children: Secondary | ICD-10-CM

## 2016-03-24 MED ORDER — CARVEDILOL 6.25 MG PO TABS: 6.2500 mg | ORAL_TABLET | Freq: Every day | ORAL | 1 refills | Status: DC

## 2016-03-24 MED ORDER — DICLOFENAC SODIUM 75 MG PO TBEC: 75.0000 mg | DELAYED_RELEASE_TABLET | Freq: Two times a day (BID) | ORAL | 0 refills | Status: DC

## 2016-03-24 MED ORDER — EZETIMIBE 10 MG PO TABS: 10.0000 mg | ORAL_TABLET | Freq: Every day | ORAL | 1 refills | Status: DC

## 2016-03-24 MED ORDER — INSULIN NPH ISOPHANE & REGULAR (70-30) 100 UNIT/ML ~~LOC~~ SUSP: SUBCUTANEOUS | 11 refills | Status: DC

## 2016-03-24 NOTE — Assessment & Plan Note (Signed)
Improved with lasix. Encouraged weight loss, exercise and compression hose.

## 2016-03-24 NOTE — Assessment & Plan Note (Signed)
pPoor control but improving with pt starting to be compliant with diet and exercise.  Reviewed diet in detail and made changes to further lower CBGS.  Pt could not afford byetta or victoza.  He wishes to continue to low glucose with lifestyle change. Recheck  In 9i/2017. If not at goal consider adding glipizide  Given it may be the only  DM med he can afford.  Actos contraindicated. SE to metformin.

## 2016-03-24 NOTE — Assessment & Plan Note (Signed)
No further chest pain.  Has follow up with cards.

## 2016-03-24 NOTE — Progress Notes (Signed)
   Subjective:    Patient ID: [name], male    DOB: [birth year], 65 y.o.   MRN: [id number]  HPI   65 year old male history significant for history of coronary artery disease status post cardiac catheterization in April 2017, status post proximal LAD stent in the setting of acute MIpresents for hospital follow up.  Admitted on 03/09/16. Discharged  On 7/14. Dx NSTEMI, unstable angina, uncontrolled DM, hyperlipidemia. Presented with chest pain, trop elevated Admitted.  Cardiac cath performed 7/13 by Dr. Kirke Corin. it revealed significant two-vessel coronary artery disease with known chronically occluded RCA, left to right collaterals, patent proximal LAD stent with minimal in-stent stenosis. Normal left ventricular systolic function with moderately elevated left ventricular end-diastolic pressure at 23 mmHg.  He was recommended to be initiated on Imdur, Lasix, Brilinta instead of Plavix. Referred for cardiac rehab.  Pravachol and zetia continued for hyperlipidemia.  DM continued poor but improving control. CBGs 170-240 in hospital.  insulin 80 in AM and 70 at night 70/30.  CBG 130-140 at home in last week. At last OV I recommended victoza (not affordable), pt was to look into [employer] coverage) this was still too high).  He has been working aggressively on diet control. No sweet foods.  reviewed his diet in detail.. Found some problem areas.Danella Penton for breakfast., etc. Wt Readings from Last 3 Encounters:  03/24/16 298 lb 12 oz (135.5 kg)  03/09/16 (!) 301 lb 14.4 oz (136.9 kg)  02/26/16 (!) 304 lb 12 oz (138.2 kg)   BP Readings from Last 3 Encounters:  03/24/16 120/60  03/11/16 (!) 127/52  02/26/16 130/60           Review of Systems  Constitutional: Positive for fatigue. Negative for fever.  HENT: Negative for ear pain.   Eyes: Negative for pain.  Respiratory: Negative for cough and shortness of breath.   Cardiovascular: Negative for chest pain, palpitations and leg swelling.    Gastrointestinal: Negative for abdominal pain.  Genitourinary: Negative for dysuria.  Musculoskeletal: Negative for arthralgias.  Neurological: Negative for syncope, light-headedness and headaches.  Psychiatric/Behavioral: Negative for dysphoric mood.       Objective:   Physical Exam  Constitutional: Vital signs are normal. He appears well-developed and well-nourished.  Morbidly obese  HENT:  Head: Normocephalic.  Right Ear: Hearing normal.  Left Ear: Hearing normal.  Nose: Nose normal.  Mouth/Throat: Oropharynx is clear and moist and mucous membranes are normal.  Neck: Trachea normal. Carotid bruit is not present. No thyroid mass and no thyromegaly present.  Cardiovascular: Normal rate, regular rhythm and normal pulses.  Exam reveals no gallop, no distant heart sounds and no friction rub.   No murmur heard. 1 + pitting bilateral peripheral edema  Pulmonary/Chest: Effort normal and breath sounds normal. No respiratory distress.  Skin: Skin is warm, dry and intact. No rash noted.  Chronically dry flaky skin  Psychiatric: He has a normal mood and affect. His speech is normal and behavior is normal. Thought content normal.          Assessment & Plan:

## 2016-03-24 NOTE — Progress Notes (Signed)
Pre visit review using our clinic review tool, if applicable. No additional management support is needed unless otherwise documented below in the visit note. 

## 2016-03-24 NOTE — Assessment & Plan Note (Signed)
Encouraged pt to stay on statin and zetia.  Pt tolerating okay.  Will recheck in 04/2016.

## 2016-03-24 NOTE — Patient Instructions (Signed)
Replace grits in morning with hard boiled egg or oatmeal, or egg white omelet.  For snacks try mozzarella cheese sticks, low sugar protein bar, small bag of almonds. Keep up great work on diet and work on walking some more. Keep appt as schedule.

## 2016-03-28 ENCOUNTER — Other Ambulatory Visit: Payer: Self-pay | Admitting: *Deleted

## 2016-03-28 MED ORDER — INSULIN NPH ISOPHANE & REGULAR (70-30) 100 UNIT/ML ~~LOC~~ SUSP: SUBCUTANEOUS | 11 refills | Status: DC

## 2016-03-29 DIAGNOSIS — E113312 Type 2 diabetes mellitus with moderate nonproliferative diabetic retinopathy with macular edema, left eye: Secondary | ICD-10-CM | POA: Diagnosis not present

## 2016-03-29 DIAGNOSIS — H348132 Central retinal vein occlusion, bilateral, stable: Secondary | ICD-10-CM | POA: Diagnosis not present

## 2016-03-29 DIAGNOSIS — E113391 Type 2 diabetes mellitus with moderate nonproliferative diabetic retinopathy without macular edema, right eye: Secondary | ICD-10-CM | POA: Diagnosis not present

## 2016-04-05 ENCOUNTER — Observation Stay (HOSPITAL_COMMUNITY)
Admission: EM | Admit: 2016-04-05 | Discharge: 2016-04-07 | Disposition: A | Discharge status: Home / Self Care | Payer: PPO | Attending: Internal Medicine | Admitting: Internal Medicine

## 2016-04-05 ENCOUNTER — Emergency Department (HOSPITAL_COMMUNITY): Payer: PPO

## 2016-04-05 ENCOUNTER — Encounter (HOSPITAL_COMMUNITY): Payer: Self-pay | Admitting: Emergency Medicine

## 2016-04-05 DIAGNOSIS — I25110 Atherosclerotic heart disease of native coronary artery with unstable angina pectoris: Secondary | ICD-10-CM | POA: Insufficient documentation

## 2016-04-05 DIAGNOSIS — E11319 Type 2 diabetes mellitus with unspecified diabetic retinopathy without macular edema: Secondary | ICD-10-CM | POA: Diagnosis not present

## 2016-04-05 DIAGNOSIS — Z955 Presence of coronary angioplasty implant and graft: Secondary | ICD-10-CM | POA: Insufficient documentation

## 2016-04-05 DIAGNOSIS — I152 Hypertension secondary to endocrine disorders: Secondary | ICD-10-CM | POA: Diagnosis present

## 2016-04-05 DIAGNOSIS — E785 Hyperlipidemia, unspecified: Secondary | ICD-10-CM | POA: Insufficient documentation

## 2016-04-05 DIAGNOSIS — Z7982 Long term (current) use of aspirin: Secondary | ICD-10-CM | POA: Diagnosis not present

## 2016-04-05 DIAGNOSIS — I214 Non-ST elevation (NSTEMI) myocardial infarction: Principal | ICD-10-CM | POA: Insufficient documentation

## 2016-04-05 DIAGNOSIS — I2510 Atherosclerotic heart disease of native coronary artery without angina pectoris: Secondary | ICD-10-CM | POA: Diagnosis present

## 2016-04-05 DIAGNOSIS — I119 Hypertensive heart disease without heart failure: Secondary | ICD-10-CM | POA: Diagnosis not present

## 2016-04-05 DIAGNOSIS — E1159 Type 2 diabetes mellitus with other circulatory complications: Secondary | ICD-10-CM | POA: Diagnosis present

## 2016-04-05 DIAGNOSIS — I249 Acute ischemic heart disease, unspecified: Secondary | ICD-10-CM

## 2016-04-05 DIAGNOSIS — Z79899 Other long term (current) drug therapy: Secondary | ICD-10-CM | POA: Insufficient documentation

## 2016-04-05 DIAGNOSIS — I200 Unstable angina: Secondary | ICD-10-CM | POA: Diagnosis not present

## 2016-04-05 DIAGNOSIS — I252 Old myocardial infarction: Secondary | ICD-10-CM | POA: Insufficient documentation

## 2016-04-05 DIAGNOSIS — R079 Chest pain, unspecified: Secondary | ICD-10-CM | POA: Diagnosis not present

## 2016-04-05 DIAGNOSIS — I10 Essential (primary) hypertension: Secondary | ICD-10-CM | POA: Diagnosis present

## 2016-04-05 DIAGNOSIS — Z7902 Long term (current) use of antithrombotics/antiplatelets: Secondary | ICD-10-CM | POA: Insufficient documentation

## 2016-04-05 DIAGNOSIS — Z794 Long term (current) use of insulin: Secondary | ICD-10-CM | POA: Insufficient documentation

## 2016-04-05 DIAGNOSIS — Z6841 Body Mass Index (BMI) 40.0 and over, adult: Secondary | ICD-10-CM | POA: Insufficient documentation

## 2016-04-05 DIAGNOSIS — E1169 Type 2 diabetes mellitus with other specified complication: Secondary | ICD-10-CM | POA: Diagnosis present

## 2016-04-05 LAB — BASIC METABOLIC PANEL
Anion gap: 8 (ref 5–15)
BUN: 21 mg/dL — ABNORMAL HIGH (ref 6–20)
CO2: 24 mmol/L (ref 22–32)
Calcium: 8.3 mg/dL — ABNORMAL LOW (ref 8.9–10.3)
Chloride: 104 mmol/L (ref 101–111)
Creatinine, Ser: 1.18 mg/dL (ref 0.61–1.24)
GFR calc Af Amer: >60 mL/min (ref >60)
GFR calc non Af Amer: >60 mL/min (ref >60)
Glucose, Bld: 366 mg/dL — ABNORMAL HIGH (ref 65–99)
Potassium: 3.6 mmol/L (ref 3.5–5.1)
Sodium: 136 mmol/L (ref 135–145)

## 2016-04-05 LAB — CBC
HCT: 35.2 % — ABNORMAL LOW (ref 39.0–52.0)
Hemoglobin: 11.5 g/dL — ABNORMAL LOW (ref 13.0–17.0)
MCH: 29.3 pg (ref 26.0–34.0)
MCHC: 32.7 g/dL (ref 30.0–36.0)
MCV: 89.8 fL (ref 78.0–100.0)
Platelets: 193 10*3/uL (ref 150–400)
RBC: 3.92 MIL/uL — ABNORMAL LOW (ref 4.22–5.81)
RDW: 13.8 % (ref 11.5–15.5)
WBC: 6.2 10*3/uL (ref 4.0–10.5)

## 2016-04-05 LAB — I-STAT TROPONIN, ED: Troponin i, poc: 0.02 ng/mL (ref 0.00–0.08)

## 2016-04-05 MED ORDER — NITROGLYCERIN 2 % TD OINT
1.0000 [in_us] | TOPICAL_OINTMENT | Freq: Four times a day (QID) | TRANSDERMAL | Status: DC
  Administered 2016-04-05 – 2016-04-06 (×5): 1 [in_us] via TOPICAL
  Filled 2016-04-05 (×2): qty 30
  Filled 2016-04-05: qty 1
  Filled 2016-04-05 (×26): qty 30

## 2016-04-05 NOTE — ED Notes (Signed)
Liliana Clineakeem Campbell; Transporter, returned pt to room.

## 2016-04-05 NOTE — ED Triage Notes (Signed)
Pt presents to ER from home with [facility] for substernal CP that radiates to LEFT shoulder blade and back with diaphoresis and fatigue and DOE; pt reports hx of MI x 2 this year which presented similarly; pt was given  ASA and 3 SL NTG enroute which brought pain from 10/10 to 1/10 on pain scale;  EMS reports 1st degree block on EKG; CBG was 349 1 hr after meal prior to calling 911

## 2016-04-05 NOTE — ED Provider Notes (Signed)
[facility]-EMERGENCY DEPT Provider Note   CSN: [id number] Arrival date & time: 04/05/16  1958  First Provider Contact:  None       History   Chief Complaint Chief Complaint  Patient presents with  . Chest Pain    HPI [name] is a 65 y.o. male.  HPI  This patient with multiple medical comorbidities including diabetes, hypertension, morbid obesity, coronary artery disease with recent cath this year, and  NSTEMI 2 months ago who presents for chest pain. It started about an hour prior to presentation. It was crushing, central chest, with shortness of breath. He says it feels like his last heart attack. He also has recently stopped taking his Brillinta into the past month.  Past Medical History:  Diagnosis Date  . Back injury 02/2002   worker's comp  . Coronary artery disease, non-occlusive    a. cath 2002 with no sig CAD;  b. cath 2008 normal LM, LAD, LCx, p&dRCA 20-30%, PDA 30%; c.11/2015 NSTEMI/PCI: LM nl, LAD 95p (2.5x15 Xience DES), LCX nl, RCA 100p/m w/ L->R collats, EF 55-65%.  . Depression   . Diabetes mellitus type 2, insulin dependent (HCC)   . Hyperlipemia   . Hypertensive heart disease   . Kidney stones   . Morbid obesity (HCC)   . Osteoarthritis   . Snoring     Patient Active Problem List   Diagnosis Date Noted  . Elevated left ventricular end-diastolic pressure (LVEDP) 03/11/2016  . Leg swelling 03/11/2016  . Anemia 03/11/2016  . Essential hypertension   . Statin intolerance   . Hypertensive heart disease   . Diabetes mellitus type 2, insulin dependent (HCC)   . Coronary artery disease, non-occlusive   . Morbid obesity (HCC)   . NSTEMI (non-ST elevated myocardial infarction) (HCC)   . Uncontrolled type 2 diabetes mellitus with circulatory disorder (HCC)   . Unstable angina (HCC) 12/24/2015  . Advanced care planning/counseling discussion 11/20/2015  . Noncompliance with diabetes treatment 11/05/2015  . Noncompliance with diet and medication regimen  11/05/2015  . Right medial knee pain 09/23/2014  . Diabetic retinopathy (HCC) 09/23/2014  . Snoring 12/21/2012  . HYPOGONADISM 09/28/2010  . VITAMIN B12 DEFICIENCY 09/28/2010  . UNSPECIFIED VITAMIN D DEFICIENCY 09/28/2010  . OTHER MALAISE AND FATIGUE 09/17/2010  . TRIGGER FINGER, RIGHT MIDDLE 09/14/2010  . BRANCH RETINAL VEIN OCCLUSION 11/26/2009  . LEG PAIN, BILATERAL 10/06/2009  . CONSTIPATION 08/10/2009  . GASTRITIS 07/29/2009  . Major depressive disorder, recurrent episode, moderate (HCC) 07/06/2007  . ERECTILE DYSFUNCTION 04/10/2007  . Uncontrolled diabetes mellitus with retinopathy (HCC) 03/26/2007  . OBESITY 03/26/2007  . CAD (coronary artery disease) 03/26/2007  . RENAL CALCULUS, HX OF 03/26/2007  . Hyperlipidemia LDL goal <70 12/05/2006  . Essential hypertension, benign 12/05/2006  . OSTEOARTHRITIS 12/05/2006    Past Surgical History:  Procedure Laterality Date  . CARDIAC CATHETERIZATION  09/2000   diffuse LAD 30% LCA  EF 50-60%  . CARDIAC CATHETERIZATION  06/2007   no significant CAD  . CARDIAC CATHETERIZATION N/A 12/25/2015   Procedure: Left Heart Cath and Coronary Angiography;  Surgeon: Antonieta Iba, MD;  Location: [facility] INVASIVE CV LAB;  Service: Cardiovascular;  Laterality: N/A;  . CARDIAC CATHETERIZATION N/A 12/25/2015   Procedure: Coronary Stent Intervention;  Surgeon: Alwyn Pea, MD;  Location: [facility] INVASIVE CV LAB;  Service: Cardiovascular;  Laterality: N/A;  . CARDIAC CATHETERIZATION N/A 03/10/2016   Procedure: Left Heart Cath and Coronary Angiography;  Surgeon: Iran Ouch, MD;  Location:  [facility] INVASIVE CV LAB;  Service: Cardiovascular;  Laterality: N/A;  . CIRCUMCISION         Home Medications    Prior to Admission medications   Medication Sig Start Date End Date Taking? Authorizing Provider  amLODipine (NORVASC) 5 MG tablet Take 1 tablet (5 mg total) by mouth daily. 01/12/16   Ok Anis, NP  aspirin 81 MG EC tablet Take 1 tablet  (81 mg total) by mouth daily. 01/12/16   Ok Anis, NP  carvedilol (COREG) 6.25 MG tablet Take 1 tablet (6.25 mg total) by mouth daily. 03/24/16   Amy Michelle Nasuti, MD  diclofenac (VOLTAREN) 75 MG EC tablet Take 1 tablet (75 mg total) by mouth 2 (two) times daily. 03/24/16   Amy Michelle Nasuti, MD  ezetimibe (ZETIA) 10 MG tablet Take 1 tablet (10 mg total) by mouth daily. 03/24/16   Amy Michelle Nasuti, MD  furosemide (LASIX) 20 MG tablet Take 1 tablet (20 mg total) by mouth daily. 03/11/16   Katharina Caper, MD  insulin NPH-regular Human (NOVOLIN 70/30 RELION) (70-30) 100 UNIT/ML injection Inject 80 units every morning and 70 units every evening 03/28/16   Amy E Ermalene Searing, MD  isosorbide mononitrate (IMDUR) 30 MG 24 hr tablet Take 1 tablet (30 mg total) by mouth daily. 03/11/16   Katharina Caper, MD  nitroGLYCERIN (NITROSTAT) 0.4 MG SL tablet Place 1 tablet (0.4 mg total) under the tongue every 5 (five) minutes as needed for chest pain. 12/26/15   Enedina Finner, MD  potassium chloride SA (K-DUR,KLOR-CON) 20 MEQ tablet Take 1 tablet (20 mEq total) by mouth daily. 03/11/16   Katharina Caper, MD  pravastatin (PRAVACHOL) 40 MG tablet Take 1 tablet (40 mg total) by mouth daily at 6 PM. 03/11/16   Katharina Caper, MD  ticagrelor (BRILINTA) 90 MG TABS tablet Take 1 tablet (90 mg total) by mouth 2 (two) times daily. 03/11/16   Katharina Caper, MD    Family History Family History  Problem Relation Age of Onset  . Alzheimer's disease Mother   . Emphysema Mother   . Diabetes Father   . Heart disease Father     MI  . Cancer Brother     ? Neck cancer    Social History Social History  Substance Use Topics  . Smoking status: Never Smoker  . Smokeless tobacco: Never Used  . Alcohol use No     Allergies   Atorvastatin   Review of Systems Review of Systems  Constitutional: Positive for diaphoresis. Negative for chills and fever.  HENT: Negative for ear pain and sore throat.   Eyes: Negative for pain and visual  disturbance.  Respiratory: Positive for shortness of breath. Negative for cough.   Cardiovascular: Positive for chest pain. Negative for palpitations.  Gastrointestinal: Positive for nausea. Negative for abdominal pain and vomiting.  Genitourinary: Negative for dysuria and hematuria.  Musculoskeletal: Negative for arthralgias and back pain.  Skin: Negative for color change and rash.  Neurological: Negative for seizures and syncope.  All other systems reviewed and are negative.    Physical Exam Updated Vital Signs BP 140/63   Pulse 72   Temp 98.2 F (36.8 C) (Oral)   Resp 12   Ht  (1.702 m)   Wt 134.7 kg   SpO2 100%   BMI 46.52 kg/m   Physical Exam  Constitutional: He appears well-developed and well-nourished.  HENT:  Head: Normocephalic and atraumatic.  Eyes: Conjunctivae are normal.  Neck: Neck supple.  Cardiovascular:  Normal rate and regular rhythm.   No murmur heard. Pulmonary/Chest: Effort normal and breath sounds normal. No respiratory distress.  Abdominal: Soft. There is no tenderness.  Musculoskeletal: He exhibits no edema.  Neurological: He is alert.  Skin: Skin is warm and dry.  Psychiatric: He has a normal mood and affect.  Nursing note and vitals reviewed.    ED Treatments / Results  Labs (all labs ordered are listed, but only abnormal results are displayed) Labs Reviewed  BASIC METABOLIC PANEL  CBC  I-STAT TROPOININ, ED    EKG  EKG Interpretation None       Radiology No results found.  Procedures Procedures (including critical care time)  Medications Ordered in ED Medications - No data to display   Initial Impression / Assessment and Plan / ED Course  I have reviewed the triage vital signs and the nursing notes.  Pertinent labs & imaging results that were available during my care of the patient were reviewed by me and considered in my medical decision making (see chart for details).  Clinical Course    High risk ACS patient  with recent NSTEMI has typical ACS symptoms today.  EKG NSR and similar to prior, first trop negative, CXR unremarkable.  Concerned that he has stopped taking his Brilinta. Admitted to cardiology for further management.  Final Clinical Impressions(s) / ED Diagnoses   Final diagnoses:  None    New Prescriptions New Prescriptions   No medications on file     Marcelina MorelMichael Britten Seyfried, MD 04/06/16 [id number]    Pricilla LovelessScott Goldston, MD 04/07/16 [phone]

## 2016-04-05 NOTE — H&P (Signed)
Cardiologist:   CC: CP  HPI: 65 yo obese CA man with HTN, DM, CAD manifested as NSTEMI in 11/2015 (PCI to LAD; was discharged on Plavix) and again 02/2016 (no PCI; was discharged on Brilinta), presented to ER for evaluation. Reports an episode of midsternal chest tightness while sitting associated with SOB. No syncope, diaphoresis. EMS called and symptoms improved with ASA and sl NTG. He reports not taking last two doses of Brilinta (last night and this morning) due to dyspnea. Otherwise taking all other meds. Now CP free with stable vitals.     Cath/PCI 12/25/2015:   Mid RCA to Dist RCA lesion, 100% stenosed.  Mid LAD-1 lesion, 90% stenosed.  Mid LAD-2 lesion, 60% stenosed.  successful PCI and stent with DES to mid LAD 2.5 x 15 mm xience   Cath 03/18/2016:  Mid RCA to Dist RCA lesion, 100% stenosed.  Mid LAD-2 lesion, 60% stenosed.  Mid LAD-1 lesion, 20% stenosed. The lesion was previously treated with a drug-eluting stent between one and five months ago.  The left ventricular systolic function is normal.  Review of Systems:  10 systems reviewed unremarkable except as noted in HPI   Past Medical History:  Diagnosis Date  . Back injury 02/2002   worker's comp  . Coronary artery disease, non-occlusive    a. cath 2002 with no sig CAD;  b. cath 2008 normal LM, LAD, LCx, p&dRCA 20-30%, PDA 30%; c.11/2015 NSTEMI/PCI: LM nl, LAD 95p (2.5x15 Xience DES), LCX nl, RCA 100p/m w/ L->R collats, EF 55-65%.  . Depression   . Diabetes mellitus type 2, insulin dependent (HCC)   . Hyperlipemia   . Hypertensive heart disease   . Kidney stones   . Morbid obesity (HCC)   . Osteoarthritis   . Snoring     No current facility-administered medications on file prior to encounter.    Current Outpatient Prescriptions on File Prior to Encounter  Medication Sig Dispense Refill  . amLODipine (NORVASC) 5 MG tablet Take 1 tablet (5 mg total) by mouth daily. 30 tablet 5  . aspirin 81 MG EC  tablet Take 1 tablet (81 mg total) by mouth daily.    . carvedilol (COREG) 6.25 MG tablet Take 1 tablet (6.25 mg total) by mouth daily. 90 tablet 1  . diclofenac (VOLTAREN) 75 MG EC tablet Take 1 tablet (75 mg total) by mouth 2 (two) times daily. (Patient taking differently: Take 75 mg by mouth daily. ) 60 tablet 0  . ezetimibe (ZETIA) 10 MG tablet Take 1 tablet (10 mg total) by mouth daily. 90 tablet 1  . furosemide (LASIX) 20 MG tablet Take 1 tablet (20 mg total) by mouth daily. 30 tablet 5  . insulin NPH-regular Human (NOVOLIN 70/30 RELION) (70-30) 100 UNIT/ML injection Inject 80 units every morning and 70 units every evening (Patient taking differently: Inject 60-70 Units into the skin 2 (two) times daily. Inject 70 units every morning and 60 units every evening) 4 vial 11  . isosorbide mononitrate (IMDUR) 30 MG 24 hr tablet Take 1 tablet (30 mg total) by mouth daily. 30 tablet 5  . nitroGLYCERIN (NITROSTAT) 0.4 MG SL tablet Place 1 tablet (0.4 mg total) under the tongue every 5 (five) minutes as needed for chest pain. 20 tablet 2  . potassium chloride SA (K-DUR,KLOR-CON) 20 MEQ tablet Take 1 tablet (20 mEq total) by mouth daily. 30 tablet 5  . pravastatin (PRAVACHOL) 40 MG tablet Take 1 tablet (40 mg total) by mouth daily  at 6 PM. 30 tablet 5  . ticagrelor (BRILINTA) 90 MG TABS tablet Take 1 tablet (90 mg total) by mouth 2 (two) times daily. 60 tablet 5      Allergies  Allergen Reactions  . Atorvastatin     REACTION: Body aches    Social History   Social History  . Marital status: Married    Spouse name: N/A  . Number of children: N/A  . Years of education: N/A   Occupational History  . disabled Unemployed    back injury   Social History Main Topics  . Smoking status: Never Smoker  . Smokeless tobacco: Never Used  . Alcohol use No  . Drug use: No  . Sexual activity: Not on file   Other Topics Concern  . Not on file   Social History Narrative   Financial concerns, wife  with depression.   Minimal exercise.   Diet: poor.    Family History  Problem Relation Age of Onset  . Alzheimer's disease Mother   . Emphysema Mother   . Diabetes Father   . Heart disease Father     MI  . Cancer Brother     ? Neck cancer    PHYSICAL EXAM: Vitals:   04/05/16 2015 04/05/16 2113  BP: 136/67 160/71  Pulse: 72 65  Resp: 19 17  Temp:     General:  Well appearing. No respiratory difficulty HEENT: normal Neck: supple. no JVD. Carotids 2+ bilat; no bruits. No lymphadenopathy or thryomegaly appreciated. Cor: PMI nondisplaced. Regular rate & rhythm. No rubs, gallops or murmurs. Lungs: clear Abdomen: soft, nontender, nondistended. No hepatosplenomegaly. No bruits or masses. Good bowel sounds. Extremities: no cyanosis, clubbing, rash, edema Neuro: alert & oriented x 3, cranial nerves grossly intact. moves all 4 extremities w/o difficulty. Affect pleasant.  ECG: NSR, no acute ST/T changes  Results for orders placed or performed during the hospital encounter of 04/05/16 (from the past 24 hour(s))  Basic metabolic panel     Status: Abnormal   Collection Time: 04/05/16  8:15 PM  Result Value Ref Range   Sodium 136 135 - 145 mmol/L   Potassium 3.6 3.5 - 5.1 mmol/L   Chloride 104 101 - 111 mmol/L   CO2 24 22 - 32 mmol/L   Glucose, Bld 366 (H) 65 - 99 mg/dL   BUN 21 (H) 6 - 20 mg/dL   Creatinine, Ser 1.61 0.61 - 1.24 mg/dL   Calcium 8.3 (L) 8.9 - 10.3 mg/dL   GFR calc non Af Amer >60 >60 mL/min   GFR calc Af Amer >60 >60 mL/min   Anion gap 8 5 - 15  CBC     Status: Abnormal   Collection Time: 04/05/16  8:15 PM  Result Value Ref Range   WBC 6.2 4.0 - 10.5 K/uL   RBC 3.92 (L) 4.22 - 5.81 MIL/uL   Hemoglobin 11.5 (L) 13.0 - 17.0 g/dL   HCT 09.6 (L) 04.5 - 40.9 %   MCV 89.8 78.0 - 100.0 fL   MCH 29.3 26.0 - 34.0 pg   MCHC 32.7 30.0 - 36.0 g/dL   RDW 81.1 91.4 - 78.2 %   Platelets 193 150 - 400 K/uL  I-stat troponin, ED     Status: None   Collection Time:  04/05/16  8:26 PM  Result Value Ref Range   Troponin i, poc 0.02 0.00 - 0.08 ng/mL   Comment 3           Dg  Chest 2 View  Result Date: 04/05/2016 CLINICAL DATA:  Chest pain for several hours with diarrhea cysts and fatigue. Coronary artery disease and hypertension. EXAM: CHEST  2 VIEW COMPARISON:  03/09/2016 FINDINGS: Mild enlargement of the cardiopericardial silhouette, without edema. The degree of cardiomegaly is similar to the prior exam. The lungs appear clear.  No pleural effusion. IMPRESSION: 1. Stable mild cardiomegaly, without edema. Electronically Signed   By: Gaylyn Rong M.D.   On: 04/05/2016 20:45     ASSESSMENT:  1. Angina pectoris in the setting of known CAD and PCI to LAD in 11/2015 (for NSTEMI) and recurrent NSTEMI in 02/2016 (no PCI) - now CP free - initial Trop and ECG unremarkable  - No shock or acute HF - Reports missed two doses of Brilinta due to SOB - not certain if angina equivalent or Brilinta related dyspnea  2. DM  PLAN/DISCUSSION:  Admit for OBS Serial Trop  Will switch Brilinta to Plavix by loading Plavix today.  Continue other home meds Monitor BG, SSI    Nevin Bloodgood, MD Cardiology

## 2016-04-06 ENCOUNTER — Encounter (HOSPITAL_COMMUNITY)
Admission: EM | Disposition: A | Discharge status: Home / Self Care | Payer: Self-pay | Source: Home / Self Care | Attending: Emergency Medicine

## 2016-04-06 DIAGNOSIS — I214 Non-ST elevation (NSTEMI) myocardial infarction: Secondary | ICD-10-CM

## 2016-04-06 DIAGNOSIS — R7989 Other specified abnormal findings of blood chemistry: Secondary | ICD-10-CM | POA: Diagnosis not present

## 2016-04-06 DIAGNOSIS — E785 Hyperlipidemia, unspecified: Secondary | ICD-10-CM

## 2016-04-06 DIAGNOSIS — I2510 Atherosclerotic heart disease of native coronary artery without angina pectoris: Secondary | ICD-10-CM

## 2016-04-06 DIAGNOSIS — I209 Angina pectoris, unspecified: Secondary | ICD-10-CM | POA: Diagnosis not present

## 2016-04-06 DIAGNOSIS — I25110 Atherosclerotic heart disease of native coronary artery with unstable angina pectoris: Secondary | ICD-10-CM | POA: Diagnosis not present

## 2016-04-06 DIAGNOSIS — I10 Essential (primary) hypertension: Secondary | ICD-10-CM | POA: Diagnosis not present

## 2016-04-06 HISTORY — PX: CARDIAC CATHETERIZATION: SHX172

## 2016-04-06 LAB — CBC
HCT: 36.8 % — ABNORMAL LOW (ref 39.0–52.0)
HCT: 36.5 % — ABNORMAL LOW (ref 39.0–52.0)
Hemoglobin: 12.1 g/dL — ABNORMAL LOW (ref 13.0–17.0)
Hemoglobin: 11.9 g/dL — ABNORMAL LOW (ref 13.0–17.0)
MCH: 29.9 pg (ref 26.0–34.0)
MCH: 29.3 pg (ref 26.0–34.0)
MCHC: 32.9 g/dL (ref 30.0–36.0)
MCHC: 32.6 g/dL (ref 30.0–36.0)
MCV: 90.9 fL (ref 78.0–100.0)
MCV: 89.9 fL (ref 78.0–100.0)
Platelets: 193 10*3/uL (ref 150–400)
Platelets: 186 10*3/uL (ref 150–400)
RBC: 4.05 MIL/uL — ABNORMAL LOW (ref 4.22–5.81)
RBC: 4.06 MIL/uL — ABNORMAL LOW (ref 4.22–5.81)
RDW: 13.9 % (ref 11.5–15.5)
RDW: 13.9 % (ref 11.5–15.5)
WBC: 8.6 10*3/uL (ref 4.0–10.5)
WBC: 6.7 10*3/uL (ref 4.0–10.5)

## 2016-04-06 LAB — GLUCOSE, CAPILLARY
Glucose-Capillary: 262 mg/dL — ABNORMAL HIGH (ref 65–99)
Glucose-Capillary: 158 mg/dL — ABNORMAL HIGH (ref 65–99)
Glucose-Capillary: 174 mg/dL — ABNORMAL HIGH (ref 65–99)
Glucose-Capillary: 239 mg/dL — ABNORMAL HIGH (ref 65–99)
Glucose-Capillary: 299 mg/dL — ABNORMAL HIGH (ref 65–99)

## 2016-04-06 LAB — BRAIN NATRIURETIC PEPTIDE: B Natriuretic Peptide: 46.1 pg/mL (ref 0.0–100.0)

## 2016-04-06 LAB — PROTIME-INR
INR: 1.11
Prothrombin Time: 14.3 seconds (ref 11.4–15.2)

## 2016-04-06 LAB — CREATININE, SERUM
Creatinine, Ser: 1.16 mg/dL (ref 0.61–1.24)
Creatinine, Ser: 1.04 mg/dL (ref 0.61–1.24)
GFR calc Af Amer: >60 mL/min (ref >60)
GFR calc Af Amer: >60 mL/min (ref >60)
GFR calc non Af Amer: >60 mL/min (ref >60)
GFR calc non Af Amer: >60 mL/min (ref >60)

## 2016-04-06 LAB — TROPONIN I
Troponin I: 2.74 ng/mL (ref <0.03)
Troponin I: 4.75 ng/mL (ref <0.03)
Troponin I: 3.5 ng/mL (ref <0.03)

## 2016-04-06 LAB — HEPARIN LEVEL (UNFRACTIONATED): Heparin Unfractionated: 0.49 IU/mL (ref 0.30–0.70)

## 2016-04-06 SURGERY — LEFT HEART CATH AND CORONARY ANGIOGRAPHY

## 2016-04-06 MED ORDER — IOPAMIDOL (ISOVUE-370) INJECTION 76%
INTRAVENOUS | Status: AC
  Filled 2016-04-06: qty 100

## 2016-04-06 MED ORDER — INSULIN ASPART PROT & ASPART (70-30 MIX) 100 UNIT/ML ~~LOC~~ SUSP
70.0000 [IU] | Freq: Two times a day (BID) | SUBCUTANEOUS | Status: DC
  Administered 2016-04-06 – 2016-04-07 (×2): 70 [IU] via SUBCUTANEOUS
  Filled 2016-04-06: qty 10

## 2016-04-06 MED ORDER — ISOSORBIDE MONONITRATE ER 60 MG PO TB24
60.0000 mg | ORAL_TABLET | Freq: Every day | ORAL | Status: DC
  Administered 2016-04-06 – 2016-04-07 (×2): 60 mg via ORAL
  Filled 2016-04-06 (×2): qty 1

## 2016-04-06 MED ORDER — SODIUM CHLORIDE 0.9 % IV SOLN: 250.0000 mL | INTRAVENOUS | Status: DC | PRN

## 2016-04-06 MED ORDER — HEPARIN SODIUM (PORCINE) 5000 UNIT/ML IJ SOLN
5000.0000 [IU] | Freq: Three times a day (TID) | INTRAMUSCULAR | Status: DC
  Administered 2016-04-06 – 2016-04-07 (×2): 5000 [IU] via SUBCUTANEOUS
  Filled 2016-04-06 (×2): qty 1

## 2016-04-06 MED ORDER — PRAVASTATIN SODIUM 40 MG PO TABS
ORAL_TABLET | ORAL | Status: AC
  Filled 2016-04-06: qty 1

## 2016-04-06 MED ORDER — SODIUM CHLORIDE 0.9% FLUSH: 3.0000 mL | INTRAVENOUS | Status: DC | PRN

## 2016-04-06 MED ORDER — HEPARIN (PORCINE) IN NACL 100-0.45 UNIT/ML-% IJ SOLN
1600.0000 [IU]/h | INTRAMUSCULAR | Status: DC
  Administered 2016-04-06: 1600 [IU]/h via INTRAVENOUS
  Filled 2016-04-06: qty 250

## 2016-04-06 MED ORDER — SODIUM CHLORIDE 0.9 % WEIGHT BASED INFUSION: 1.0000 mL/kg/h | INTRAVENOUS | Status: AC

## 2016-04-06 MED ORDER — SODIUM CHLORIDE 0.9% FLUSH: 3.0000 mL | Freq: Two times a day (BID) | INTRAVENOUS | Status: DC

## 2016-04-06 MED ORDER — ZOLPIDEM TARTRATE 5 MG PO TABS
5.0000 mg | ORAL_TABLET | Freq: Once | ORAL | Status: AC
  Administered 2016-04-06: 5 mg via ORAL
  Filled 2016-04-06: qty 1

## 2016-04-06 MED ORDER — NITROGLYCERIN 0.4 MG SL SUBL: 0.4000 mg | SUBLINGUAL_TABLET | SUBLINGUAL | Status: DC | PRN

## 2016-04-06 MED ORDER — CLOPIDOGREL BISULFATE 75 MG PO TABS
600.0000 mg | ORAL_TABLET | Freq: Once | ORAL | Status: AC
  Administered 2016-04-06: 600 mg via ORAL

## 2016-04-06 MED ORDER — CLOPIDOGREL BISULFATE 75 MG PO TABS
ORAL_TABLET | ORAL | Status: AC
  Administered 2016-04-06: 600 mg via ORAL
  Filled 2016-04-06: qty 8

## 2016-04-06 MED ORDER — OXYCODONE-ACETAMINOPHEN 5-325 MG PO TABS: 1.0000 | ORAL_TABLET | ORAL | Status: DC | PRN

## 2016-04-06 MED ORDER — HEPARIN (PORCINE) IN NACL 2-0.9 UNIT/ML-% IJ SOLN
INTRAMUSCULAR | Status: AC
  Filled 2016-04-06: qty 1500

## 2016-04-06 MED ORDER — ASPIRIN 81 MG PO CHEW
81.0000 mg | CHEWABLE_TABLET | ORAL | Status: AC
  Administered 2016-04-06: 81 mg via ORAL
  Filled 2016-04-06: qty 1

## 2016-04-06 MED ORDER — LIDOCAINE HCL (PF) 1 % IJ SOLN
INTRAMUSCULAR | Status: AC
  Filled 2016-04-06: qty 30

## 2016-04-06 MED ORDER — VERAPAMIL HCL 2.5 MG/ML IV SOLN
INTRAVENOUS | Status: AC
  Filled 2016-04-06: qty 2

## 2016-04-06 MED ORDER — SODIUM CHLORIDE 0.9 % WEIGHT BASED INFUSION: 1.0000 mL/kg/h | INTRAVENOUS | Status: DC

## 2016-04-06 MED ORDER — IOPAMIDOL (ISOVUE-370) INJECTION 76%
INTRAVENOUS | Status: AC
  Filled 2016-04-06: qty 50

## 2016-04-06 MED ORDER — MIDAZOLAM HCL 2 MG/2ML IJ SOLN
INTRAMUSCULAR | Status: AC
  Filled 2016-04-06: qty 2

## 2016-04-06 MED ORDER — FENTANYL CITRATE (PF) 100 MCG/2ML IJ SOLN
INTRAMUSCULAR | Status: AC
  Filled 2016-04-06: qty 2

## 2016-04-06 MED ORDER — AMLODIPINE BESYLATE 5 MG PO TABS
5.0000 mg | ORAL_TABLET | Freq: Every day | ORAL | Status: DC
Start: 2016-04-06 — End: 2016-04-07
  Administered 2016-04-06 – 2016-04-07 (×2): 5 mg via ORAL
  Filled 2016-04-06 (×2): qty 1

## 2016-04-06 MED ORDER — SODIUM CHLORIDE 0.9% FLUSH
3.0000 mL | Freq: Two times a day (BID) | INTRAVENOUS | Status: DC
  Administered 2016-04-06: 3 mL via INTRAVENOUS

## 2016-04-06 MED ORDER — EZETIMIBE 10 MG PO TABS
10.0000 mg | ORAL_TABLET | Freq: Every day | ORAL | Status: DC
  Administered 2016-04-06 – 2016-04-07 (×2): 10 mg via ORAL
  Filled 2016-04-06 (×2): qty 1

## 2016-04-06 MED ORDER — TRIAMTERENE-HCTZ 37.5-25 MG PO TABS
1.0000 | ORAL_TABLET | Freq: Every day | ORAL | Status: DC
  Administered 2016-04-06 – 2016-04-07 (×2): 1 via ORAL
  Filled 2016-04-06 (×2): qty 1

## 2016-04-06 MED ORDER — VERAPAMIL HCL 2.5 MG/ML IV SOLN
INTRAVENOUS | Status: DC | PRN
  Administered 2016-04-06: 10 mL via INTRA_ARTERIAL

## 2016-04-06 MED ORDER — ASPIRIN EC 81 MG PO TBEC: 81.0000 mg | DELAYED_RELEASE_TABLET | Freq: Every day | ORAL | Status: DC

## 2016-04-06 MED ORDER — FUROSEMIDE 20 MG PO TABS
20.0000 mg | ORAL_TABLET | Freq: Every day | ORAL | Status: DC
  Administered 2016-04-07: 20 mg via ORAL
  Filled 2016-04-06 (×2): qty 1

## 2016-04-06 MED ORDER — CARVEDILOL 6.25 MG PO TABS
ORAL_TABLET | ORAL | Status: AC
  Filled 2016-04-06: qty 1

## 2016-04-06 MED ORDER — ONDANSETRON HCL 4 MG/2ML IJ SOLN: 4.0000 mg | Freq: Four times a day (QID) | INTRAMUSCULAR | Status: DC | PRN

## 2016-04-06 MED ORDER — SODIUM CHLORIDE 0.9 % WEIGHT BASED INFUSION: 3.0000 mL/kg/h | INTRAVENOUS | Status: DC

## 2016-04-06 MED ORDER — HEPARIN BOLUS VIA INFUSION
4000.0000 [IU] | Freq: Once | INTRAVENOUS | Status: AC
  Administered 2016-04-06: 4000 [IU] via INTRAVENOUS
  Filled 2016-04-06: qty 4000

## 2016-04-06 MED ORDER — ENOXAPARIN SODIUM 40 MG/0.4ML ~~LOC~~ SOLN: 40.0000 mg | Freq: Every day | SUBCUTANEOUS | Status: DC

## 2016-04-06 MED ORDER — HEPARIN (PORCINE) IN NACL 2-0.9 UNIT/ML-% IJ SOLN
INTRAMUSCULAR | Status: DC | PRN
  Administered 2016-04-06: 1000 mL

## 2016-04-06 MED ORDER — PRAVASTATIN SODIUM 40 MG PO TABS
40.0000 mg | ORAL_TABLET | Freq: Every day | ORAL | Status: DC
  Administered 2016-04-06: 40 mg via ORAL
  Filled 2016-04-06: qty 1

## 2016-04-06 MED ORDER — LIDOCAINE HCL (PF) 1 % IJ SOLN
INTRAMUSCULAR | Status: DC | PRN
Start: 2016-04-06 — End: 2016-04-06
  Administered 2016-04-06: 2 mL via INTRADERMAL

## 2016-04-06 MED ORDER — FENTANYL CITRATE (PF) 100 MCG/2ML IJ SOLN
INTRAMUSCULAR | Status: DC | PRN
  Administered 2016-04-06 (×2): 50 ug via INTRAVENOUS

## 2016-04-06 MED ORDER — CLOPIDOGREL BISULFATE 75 MG PO TABS
75.0000 mg | ORAL_TABLET | Freq: Every day | ORAL | Status: DC
  Administered 2016-04-06 – 2016-04-07 (×2): 75 mg via ORAL
  Filled 2016-04-06 (×2): qty 1

## 2016-04-06 MED ORDER — ACETAMINOPHEN 325 MG PO TABS: 650.0000 mg | ORAL_TABLET | ORAL | Status: DC | PRN

## 2016-04-06 MED ORDER — HEPARIN SODIUM (PORCINE) 1000 UNIT/ML IJ SOLN
INTRAMUSCULAR | Status: AC
  Filled 2016-04-06: qty 1

## 2016-04-06 MED ORDER — POTASSIUM CHLORIDE CRYS ER 20 MEQ PO TBCR
20.0000 meq | EXTENDED_RELEASE_TABLET | Freq: Every day | ORAL | Status: DC
  Administered 2016-04-06 – 2016-04-07 (×2): 20 meq via ORAL
  Filled 2016-04-06 (×2): qty 1

## 2016-04-06 MED ORDER — ASPIRIN 81 MG PO CHEW
81.0000 mg | CHEWABLE_TABLET | Freq: Every day | ORAL | Status: DC
Start: 2016-04-06 — End: 2016-04-07
  Administered 2016-04-07: 81 mg via ORAL
  Filled 2016-04-06: qty 1

## 2016-04-06 MED ORDER — CLOPIDOGREL BISULFATE 75 MG PO TABS: 75.0000 mg | ORAL_TABLET | Freq: Every day | ORAL | Status: DC

## 2016-04-06 MED ORDER — ACETAMINOPHEN 325 MG PO TABS
650.0000 mg | ORAL_TABLET | ORAL | Status: DC | PRN
Start: 2016-04-06 — End: 2016-04-06

## 2016-04-06 MED ORDER — IOPAMIDOL (ISOVUE-370) INJECTION 76%
INTRAVENOUS | Status: DC | PRN
Start: 2016-04-06 — End: 2016-04-06
  Administered 2016-04-06: 110 mL via INTRA_ARTERIAL

## 2016-04-06 MED ORDER — CARVEDILOL 6.25 MG PO TABS
6.2500 mg | ORAL_TABLET | Freq: Every day | ORAL | Status: DC
  Administered 2016-04-06 – 2016-04-07 (×2): 6.25 mg via ORAL
  Filled 2016-04-06 (×2): qty 1

## 2016-04-06 MED ORDER — MIDAZOLAM HCL 2 MG/2ML IJ SOLN
INTRAMUSCULAR | Status: DC | PRN
  Administered 2016-04-06 (×2): 1 mg via INTRAVENOUS

## 2016-04-06 SURGICAL SUPPLY — 11 items
CATH INFINITI 5 FR JL3.5 (CATHETERS) ×2 IMPLANT
CATH INFINITI JR4 5F (CATHETERS) ×2 IMPLANT
CATH LAUNCHER 5F EBU3.5 (CATHETERS) ×2 IMPLANT
DEVICE RAD COMP TR BAND LRG (VASCULAR PRODUCTS) ×2 IMPLANT
GLIDESHEATH SLEND A-KIT 6F 22G (SHEATH) ×2 IMPLANT
KIT HEART LEFT (KITS) ×2 IMPLANT
PACK CARDIAC CATHETERIZATION (CUSTOM PROCEDURE TRAY) ×2 IMPLANT
TRANSDUCER W/STOPCOCK (MISCELLANEOUS) ×2 IMPLANT
TUBING CIL FLEX 10 FLL-RA (TUBING) ×2 IMPLANT
WIRE HI TORQ VERSACORE-J 145CM (WIRE) ×2 IMPLANT
WIRE SAFE-T 1.5MM-J .035X260CM (WIRE) ×2 IMPLANT

## 2016-04-06 NOTE — H&P (View-Only) (Signed)
Patient Name: Estella HuskJohn H Sheek Date of Encounter: 04/06/2016     Active Problems:   Chest pain    SUBJECTIVE  No chest pain currently. Glad to be off Brilnta. Talked with wife. Plan is heart cath today.   CURRENT MEDS . amLODipine  5 mg Oral Daily  . aspirin EC  81 mg Oral Daily  . carvedilol  6.25 mg Oral Q breakfast  . clopidogrel  75 mg Oral Q breakfast  . ezetimibe  10 mg Oral Daily  . furosemide  20 mg Oral Daily  . insulin aspart protamine- aspart  70 Units Subcutaneous BID WC  . isosorbide mononitrate  60 mg Oral Daily  . nitroGLYCERIN  1 inch Topical Q6H  . potassium chloride SA  20 mEq Oral Daily  . pravastatin  40 mg Oral q1800  . triamterene-hydrochlorothiazide  1 tablet Oral Daily    OBJECTIVE  Vitals:   04/05/16 2200 04/05/16 2230 04/05/16 2310 04/06/16 0452  BP: 138/71 138/76 (!) 145/70 129/62  Pulse: 69 71 77 72  Resp: 14 20 18 18   Temp:   98.5 F (36.9 C) 98.1 F (36.7 C)  TempSrc:   Oral Oral  SpO2: 99% 98% 97% 97%  Weight:   299 lb 12.8 oz (136 kg) 296 lb 1.6 oz (134.3 kg)  Height:   5\' 7"  (1.702 m)     Intake/Output Summary (Last 24 hours) at 04/06/16 0737 Last data filed at 04/06/16 0100  Gross per 24 hour  Intake              540 ml  Output              200 ml  Net              340 ml   Filed Weights   04/05/16 2010 04/05/16 2310 04/06/16 0452  Weight: 297 lb (134.7 kg) 299 lb 12.8 oz (136 kg) 296 lb 1.6 oz (134.3 kg)    PHYSICAL EXAM  General: Pleasant, NAD. obese Neuro: Alert and oriented X 3. Moves all extremities spontaneously. Psych: Normal affect. HEENT:  Normal  Neck: Supple without bruits or JVD. Lungs:  Resp regular and unlabored, CTA. Heart: RRR no s3, s4, or murmurs. Abdomen: Soft, non-tender, non-distended, BS + x 4.  Extremities: No clubbing, cyanosis or edema. DP/PT/Radials 2+ and equal bilaterally.  Accessory Clinical Findings  CBC  Recent Labs  04/05/16 2015 04/06/16 0147  WBC 6.2 8.6  HGB 11.5* 12.1*    HCT 35.2* 36.8*  MCV 89.8 90.9  PLT 193 193   Basic Metabolic Panel  Recent Labs  04/05/16 2015 04/06/16 0147  NA 136  --   K 3.6  --   CL 104  --   CO2 24  --   GLUCOSE 366*  --   BUN 21*  --   CREATININE 1.18 1.16  CALCIUM 8.3*  --    Liver Function Tests No results for input(s): AST, ALT, ALKPHOS, BILITOT, PROT, ALBUMIN in the last 72 hours. No results for input(s): LIPASE, AMYLASE in the last 72 hours. Cardiac Enzymes  Recent Labs  04/06/16 0147  TROPONINI 2.74*    TELE  Sinus with PVCs  Radiology/Studies  Dg Chest 2 View  Result Date: 04/05/2016 CLINICAL DATA:  Chest pain for several hours with diarrhea cysts and fatigue. Coronary artery disease and hypertension. EXAM: CHEST  2 VIEW COMPARISON:  03/09/2016 FINDINGS: Mild enlargement of the cardiopericardial silhouette, without edema. The degree of cardiomegaly  is similar to the prior exam. The lungs appear clear.  No pleural effusion. IMPRESSION: 1. Stable mild cardiomegaly, without edema. Electronically Signed   By: Walter  Liebkemann M.D.   On: 04/05/2016 20:45   Dg Chest Portable 1 View  Result Date: 03/09/2016 CLINICAL DATA:  Central chest pain shortness of breath for 1 day, coronary disease, diabetes mellitus, hypertension, hyperlipidemia EXAM: PORTABLE CHEST 1 VIEW COMPARISON:  Portable exam 0844 hours compared to 12/24/2015 FINDINGS: Enlargement of cardiac silhouette. Mediastinal contours and pulmonary vascularity normal. Lungs clear. No pleural effusion or pneumothorax. Bones unremarkable. IMPRESSION: Enlargement of cardiac silhouette. No acute abnormalities. Electronically Signed   By: Mark  Boles M.D.   On: 03/09/2016 09:01   Cath/PCI 12/25/2015:   Mid RCA to Dist RCA lesion, 100% stenosed.  Mid LAD-1 lesion, 90% stenosed.  Mid LAD-2 lesion, 60% stenosed.  successful PCI and stent with DES to mid LAD 2.5 x 15 mm xience   Cath 03/18/2016:  Mid RCA to Dist RCA lesion, 100% stenosed.  Mid LAD-2  lesion, 60% stenosed.  Mid LAD-1 lesion, 20% stenosed. The lesion was previously treated with a drug-eluting stent between one and five months ago.  The left ventricular systolic function is normal.    ASSESSMENT AND PLAN 65 yo obese [place] man with morbid obesity, HTN, DM, CAD manifested as NSTEMI in 11/2015 (PCI to LAD; was discharged on Plavix) and again 02/2016 (no PCI; was discharged on Brilinta), who presented to the [facility] ER on 04/05/16 for evaluation of chest pain and now ruled in for NSTEMI.  NSTEMI: troponin now 2.74. He had missed two doses of Brilinta this week and had significant SOB associated with the drug. This was discontinued and he was loaded with plavix 600mg. On Iv heparin. Plan for cath today  I have reviewed the risks, indications, and alternatives to cardiac catheterization and possible angioplasty/stenting with the patient. Risks include but are not limited to bleeding, infection, vascular injury, stroke, myocardial infection, arrhythmia, kidney injury, radiation-related injury in the case of prolonged fluoroscopy use, emergency cardiac surgery, and death. The patient understands the risks of serious complication is low (<1%).    CAD s/p PCI to LAD in 11/2015 (for NSTEMI) and recurrent NSTEMI in 02/2016 (no PCI) with known CTO RCA: continue ASA, plavix (SOB with Brilinta), BB and statin  DMT2: last HgA1c 8.1 in 01/2016. Cont SSI   HTN: BP well controlled currently   HLD: LDL 139 on 02/16/16. He has been on pravastatin 40mg daily and Zetia 10mg daily. Has a listed allergy to atorvastatin. Consider trailing high dose Crestor or sending to lipid clinic for evaluation for PCSK9i candidacy with recurrent ischemic events.  Obesity: Body mass index is 46.38 kg/m. He will need to work on diet and exercise.    Signed, Kathryn Thompson PA-C  Pager [phone]  Agree with note by Katie Thompson PA-C  Known CAD s/p NSTEMI PCI / DES LAD with known occl RCA. Had cath last month without  changes in anatomy. Plavix switched to Brilenta. He missed 2 doses of Brilenta and had his usual CP. Currently pain free on IV hep. Trop trending up. For re look cath this AM.  Jonathan J. Berry, M.D., FACP, FACC, FAHA, FSCAI [facility] [address] , [place]  [address]  [phone] 04/06/2016 9:30 AM  

## 2016-04-06 NOTE — Interval H&P Note (Signed)
Cath Lab Visit (complete for each Cath Lab visit)  Clinical Evaluation Leading to the Procedure:   ACS: Yes.    Non-ACS:    Anginal Classification: CCS III  Anti-ischemic medical therapy: Maximal Therapy (2 or more classes of medications)  Non-Invasive Test Results: No non-invasive testing performed  Prior CABG: No previous CABG      History and Physical Interval Note:  04/06/2016 10:18 AM  [name]  has presented today for surgery, with the diagnosis of cp  The various methods of treatment have been discussed with the patient and family. After consideration of risks, benefits and other options for treatment, the patient has consented to  Procedure(s): Left Heart Cath and Coronary Angiography (N/A) as a surgical intervention .  [name] history has been reviewed, patient examined, no change in status, stable for surgery.  I have reviewed the patient's chart and labs.  Questions were answered to the patient's satisfaction.     Lyn RecordsHenry W Smith III

## 2016-04-06 NOTE — Progress Notes (Signed)
Patient Name: Estella HuskJohn H Sheek Date of Encounter: 04/06/2016     Active Problems:   Chest pain    SUBJECTIVE  No chest pain currently. Glad to be off Brilnta. Talked with wife. Plan is heart cath today.   CURRENT MEDS . amLODipine  5 mg Oral Daily  . aspirin EC  81 mg Oral Daily  . carvedilol  6.25 mg Oral Q breakfast  . clopidogrel  75 mg Oral Q breakfast  . ezetimibe  10 mg Oral Daily  . furosemide  20 mg Oral Daily  . insulin aspart protamine- aspart  70 Units Subcutaneous BID WC  . isosorbide mononitrate  60 mg Oral Daily  . nitroGLYCERIN  1 inch Topical Q6H  . potassium chloride SA  20 mEq Oral Daily  . pravastatin  40 mg Oral q1800  . triamterene-hydrochlorothiazide  1 tablet Oral Daily    OBJECTIVE  Vitals:   04/05/16 2200 04/05/16 2230 04/05/16 2310 04/06/16 0452  BP: 138/71 138/76 (!) 145/70 129/62  Pulse: 69 71 77 72  Resp: 14 20 18 18   Temp:   98.5 F (36.9 C) 98.1 F (36.7 C)  TempSrc:   Oral Oral  SpO2: 99% 98% 97% 97%  Weight:   299 lb 12.8 oz (136 kg) 296 lb 1.6 oz (134.3 kg)  Height:   5\' 7"  (1.702 m)     Intake/Output Summary (Last 24 hours) at 04/06/16 0737 Last data filed at 04/06/16 0100  Gross per 24 hour  Intake              540 ml  Output              200 ml  Net              340 ml   Filed Weights   04/05/16 2010 04/05/16 2310 04/06/16 0452  Weight: 297 lb (134.7 kg) 299 lb 12.8 oz (136 kg) 296 lb 1.6 oz (134.3 kg)    PHYSICAL EXAM  General: Pleasant, NAD. obese Neuro: Alert and oriented X 3. Moves all extremities spontaneously. Psych: Normal affect. HEENT:  Normal  Neck: Supple without bruits or JVD. Lungs:  Resp regular and unlabored, CTA. Heart: RRR no s3, s4, or murmurs. Abdomen: Soft, non-tender, non-distended, BS + x 4.  Extremities: No clubbing, cyanosis or edema. DP/PT/Radials 2+ and equal bilaterally.  Accessory Clinical Findings  CBC  Recent Labs  04/05/16 2015 04/06/16 0147  WBC 6.2 8.6  HGB 11.5* 12.1*    HCT 35.2* 36.8*  MCV 89.8 90.9  PLT 193 193   Basic Metabolic Panel  Recent Labs  04/05/16 2015 04/06/16 0147  NA 136  --   K 3.6  --   CL 104  --   CO2 24  --   GLUCOSE 366*  --   BUN 21*  --   CREATININE 1.18 1.16  CALCIUM 8.3*  --    Liver Function Tests No results for input(s): AST, ALT, ALKPHOS, BILITOT, PROT, ALBUMIN in the last 72 hours. No results for input(s): LIPASE, AMYLASE in the last 72 hours. Cardiac Enzymes  Recent Labs  04/06/16 0147  TROPONINI 2.74*    TELE  Sinus with PVCs  Radiology/Studies  Dg Chest 2 View  Result Date: 04/05/2016 CLINICAL DATA:  Chest pain for several hours with diarrhea cysts and fatigue. Coronary artery disease and hypertension. EXAM: CHEST  2 VIEW COMPARISON:  03/09/2016 FINDINGS: Mild enlargement of the cardiopericardial silhouette, without edema. The degree of cardiomegaly  is similar to the prior exam. The lungs appear clear.  No pleural effusion. IMPRESSION: 1. Stable mild cardiomegaly, without edema. Electronically Signed   By: Gaylyn Rong M.D.   On: 04/05/2016 20:45   Dg Chest Portable 1 View  Result Date: 03/09/2016 CLINICAL DATA:  Central chest pain shortness of breath for 1 day, coronary disease, diabetes mellitus, hypertension, hyperlipidemia EXAM: PORTABLE CHEST 1 VIEW COMPARISON:  Portable exam 0844 hours compared to 12/24/2015 FINDINGS: Enlargement of cardiac silhouette. Mediastinal contours and pulmonary vascularity normal. Lungs clear. No pleural effusion or pneumothorax. Bones unremarkable. IMPRESSION: Enlargement of cardiac silhouette. No acute abnormalities. Electronically Signed   By: Ulyses Southward M.D.   On: 03/09/2016 09:01   Cath/PCI 12/25/2015:   Mid RCA to Dist RCA lesion, 100% stenosed.  Mid LAD-1 lesion, 90% stenosed.  Mid LAD-2 lesion, 60% stenosed.  successful PCI and stent with DES to mid LAD 2.5 x 15 mm xience   Cath 03/18/2016:  Mid RCA to Dist RCA lesion, 100% stenosed.  Mid LAD-2  lesion, 60% stenosed.  Mid LAD-1 lesion, 20% stenosed. The lesion was previously treated with a drug-eluting stent between one and five months ago.  The left ventricular systolic function is normal.    ASSESSMENT AND PLAN 65 yo obese [place] man with morbid obesity, HTN, DM, CAD manifested as NSTEMI in 11/2015 (PCI to LAD; was discharged on Plavix) and again 02/2016 (no PCI; was discharged on Brilinta), who presented to the [facility] ER on 04/05/16 for evaluation of chest pain and now ruled in for NSTEMI.  NSTEMI: troponin now 2.74. He had missed two doses of Brilinta this week and had significant SOB associated with the drug. This was discontinued and he was loaded with plavix . On Iv heparin. Plan for cath today  I have reviewed the risks, indications, and alternatives to cardiac catheterization and possible angioplasty/stenting with the patient. Risks include but are not limited to bleeding, infection, vascular injury, stroke, myocardial infection, arrhythmia, kidney injury, radiation-related injury in the case of prolonged fluoroscopy use, emergency cardiac surgery, and death. The patient understands the risks of serious complication is low (<1%).    CAD s/p PCI to LAD in 11/2015 (for NSTEMI) and recurrent NSTEMI in 02/2016 (no PCI) with known CTO RCA: continue ASA, plavix (SOB with Brilinta), BB and statin  DMT2: last HgA1c 8.1 in 01/2016. Cont SSI   HTN: BP well controlled currently   HLD: LDL 139 on 02/16/16. He has been on pravastatin  daily and Zetia  daily. Has a listed allergy to atorvastatin. Consider trailing high dose Crestor or sending to lipid clinic for evaluation for PCSK9i candidacy with recurrent ischemic events.  Obesity: Body mass index is 46.38 kg/m. He will need to work on diet and exercise.    Signed, Cline Crock PA-C  Pager [phone]  Agree with note by Carlean Jews PA-C  Known CAD s/p NSTEMI PCI / DES LAD with known occl RCA. Had cath last month without  changes in anatomy. Plavix switched to Brilenta. He missed 2 doses of Brilenta and had his usual CP. Currently pain free on IV hep. Trop trending up. For re look cath this AM.  Runell Gess, M.D., Roseanne Reno [facility] [address] [place], [place]  [address]  [phone] 04/06/2016 9:30 AM

## 2016-04-06 NOTE — Progress Notes (Signed)
CRITICAL VALUE ALERT  Critical value received:  Troponin 2.74  Date of notification:  04/06/16  Time of notification:  0318  Critical value read back:Yes.    Nurse who received alert:  Sheppard Evensina Evian Salguero  MD notified (1st page):  Azeem  Time of first page:  0321  MD notified (2nd page):  Time of second page:  Responding MD:  Azeem  Time MD responded:  0325  Orders placed for client to be NPO and start Heparin gtt.

## 2016-04-06 NOTE — Progress Notes (Signed)
ANTICOAGULATION CONSULT NOTE - Initial Consult  Pharmacy Consult for Heparin Indication: chest pain/ACS  Allergies  Allergen Reactions  . Atorvastatin     REACTION: Body aches    Patient Measurements: Height: 5\' 7"  (170.2 cm) Weight: 299 lb 12.8 oz (136 kg) IBW/kg (Calculated) : 66.1 Heparin Dosing Weight:  100 kg  Vital Signs: Temp: 98.5 F (36.9 C) (08/08 2310) Temp Source: Oral (08/08 2310) BP: 145/70 (08/08 2310) Pulse Rate: 77 (08/08 2310)  Labs:  Recent Labs  04/05/16 2015 04/06/16 0147  HGB 11.5* 12.1*  HCT 35.2* 36.8*  PLT 193 193  CREATININE 1.18 1.16  TROPONINI  --  2.74*    Estimated Creatinine Clearance: 83.4 mL/min (by C-G formula based on SCr of 1.16 mg/dL).   Medical History: Past Medical History:  Diagnosis Date  . Back injury 02/2002   worker's comp  . Coronary artery disease, non-occlusive    a. cath 2002 with no sig CAD;  b. cath 2008 normal LM, LAD, LCx, p&dRCA 20-30%, PDA 30%; c.11/2015 NSTEMI/PCI: LM nl, LAD 95p (2.5x15 Xience DES), LCX nl, RCA 100p/m w/ L->R collats, EF 55-65%.  . Depression   . Diabetes mellitus type 2, insulin dependent (HCC)   . Hyperlipemia   . Hypertensive heart disease   . Kidney stones   . Morbid obesity (HCC)   . Osteoarthritis   . Snoring     Medications:  Prescriptions Prior to Admission  Medication Sig Dispense Refill Last Dose  . amLODipine (NORVASC) 5 MG tablet Take 1 tablet (5 mg total) by mouth daily. 30 tablet 5 04/05/2016 at Unknown time  . aspirin 81 MG EC tablet Take 1 tablet (81 mg total) by mouth daily.   04/05/2016 at Unknown time  . carvedilol (COREG) 6.25 MG tablet Take 1 tablet (6.25 mg total) by mouth daily. 90 tablet 1 04/05/2016 at 0530  . ezetimibe (ZETIA) 10 MG tablet Take 1 tablet (10 mg total) by mouth daily. 90 tablet 1 04/05/2016 at Unknown time  . furosemide (LASIX) 20 MG tablet Take 1 tablet (20 mg total) by mouth daily. 30 tablet 5 04/05/2016 at Unknown time  . insulin NPH-regular Human  (NOVOLIN 70/30 RELION) (70-30) 100 UNIT/ML injection Inject 80 units every morning and 70 units every evening (Patient taking differently: Inject 60-70 Units into the skin 2 (two) times daily. Inject 70 units every morning and 60 units every evening) 4 vial 11 04/05/2016 at Unknown time  . isosorbide mononitrate (IMDUR) 30 MG 24 hr tablet Take 1 tablet (30 mg total) by mouth daily. 30 tablet 5 04/05/2016 at Unknown time  . nitroGLYCERIN (NITROSTAT) 0.4 MG SL tablet Place 1 tablet (0.4 mg total) under the tongue every 5 (five) minutes as needed for chest pain. 20 tablet 2 04/05/2016 at Unknown time  . potassium chloride SA (K-DUR,KLOR-CON) 20 MEQ tablet Take 1 tablet (20 mEq total) by mouth daily. 30 tablet 5 04/05/2016 at Unknown time  . pravastatin (PRAVACHOL) 40 MG tablet Take 1 tablet (40 mg total) by mouth daily at 6 PM. 30 tablet 5 04/05/2016 at Unknown time  . triamterene-hydrochlorothiazide (MAXZIDE-25) 37.5-25 MG tablet Take 1 tablet by mouth daily.   04/05/2016 at Unknown time    Assessment: 65 y.o. male with chest pain, elevated cardiac markers, for heparin   Goal of Therapy:  Heparin level 0.3-0.7 units/ml Monitor platelets by anticoagulation protocol: Yes   Plan:  Heparin 4000 units IV bolus, then start heparin 1600 units/hr Check heparin level in 6 hours.  Luisalberto Beegle, Gary Fleet 04/06/2016,3:27 AM

## 2016-04-06 NOTE — Progress Notes (Signed)
ANTICOAGULATION CONSULT NOTE - Follow Up Consult  Pharmacy Consult for heparin Indication: chest pain/ACS  Allergies  Allergen Reactions  . Atorvastatin     REACTION: Body aches    Patient Measurements: Height: 5\' 7"  (170.2 cm) Weight: 296 lb 1.6 oz (134.3 kg) IBW/kg (Calculated) : 66.1 Heparin Dosing Weight: 98.6 kg  Vital Signs: Temp: 98.1 F (36.7 C) (08/09 0452) Temp Source: Oral (08/09 0452) BP: 129/62 (08/09 0452) Pulse Rate: 72 (08/09 0452)  Labs:  Recent Labs  04/05/16 2015 04/06/16 0147 04/06/16 0750 04/06/16 0950  HGB 11.5* 12.1*  --   --   HCT 35.2* 36.8*  --   --   PLT 193 193  --   --   LABPROT  --   --   --  14.3  INR  --   --   --  1.11  HEPARINUNFRC  --   --   --  0.49  CREATININE 1.18 1.16  --   --   TROPONINI  --  2.74* 4.75*  --     Estimated Creatinine Clearance: 82.8 mL/min (by C-G formula based on SCr of 1.16 mg/dL).  Assessment: 65 yo M on heparin for ACS.  First HL drawn 6 hours after 4000 units bolus and drip at 1600 units/hr is therapeutic at 0.49. INR 1.11, CBC stable, no bleeding reported.   Goal of Therapy:  Heparin level 0.3-0.7 units/ml Monitor platelets by anticoagulation protocol: Yes   Plan: continue heparin at 1600 units/hr F/u after cardiac cath  Herby AbrahamMichelle T. Rosemaria Inabinet, Pharm.D. [phone] 04/06/2016 10:35 AM

## 2016-04-06 NOTE — Plan of Care (Signed)
Cardiology   Trop above 2. Ruled in fro NSTEMI. Still CP free. Stable vitals.   He already has received his ASA 324 mg po, Plavix 600 mg po, Coreg.   Will start Heparin. Keep NPO. Possible cath in am.   Nevin BloodgoodAmir Aidian Salomon, MD

## 2016-04-07 ENCOUNTER — Encounter (HOSPITAL_COMMUNITY): Payer: Self-pay | Admitting: Interventional Cardiology

## 2016-04-07 DIAGNOSIS — I2510 Atherosclerotic heart disease of native coronary artery without angina pectoris: Secondary | ICD-10-CM | POA: Diagnosis present

## 2016-04-07 DIAGNOSIS — I209 Angina pectoris, unspecified: Secondary | ICD-10-CM

## 2016-04-07 DIAGNOSIS — R7989 Other specified abnormal findings of blood chemistry: Secondary | ICD-10-CM | POA: Diagnosis not present

## 2016-04-07 DIAGNOSIS — I214 Non-ST elevation (NSTEMI) myocardial infarction: Secondary | ICD-10-CM | POA: Diagnosis not present

## 2016-04-07 DIAGNOSIS — E785 Hyperlipidemia, unspecified: Secondary | ICD-10-CM | POA: Diagnosis not present

## 2016-04-07 DIAGNOSIS — I10 Essential (primary) hypertension: Secondary | ICD-10-CM | POA: Diagnosis not present

## 2016-04-07 LAB — GLUCOSE, CAPILLARY: Glucose-Capillary: 137 mg/dL — ABNORMAL HIGH (ref 65–99)

## 2016-04-07 MED ORDER — CLOPIDOGREL BISULFATE 75 MG PO TABS: 75.0000 mg | ORAL_TABLET | Freq: Every day | ORAL | 3 refills | Status: DC

## 2016-04-07 NOTE — Care Management Obs Status (Signed)
MEDICARE OBSERVATION STATUS NOTIFICATION   Patient Details  Name: [name] MRN: [id number] Date of Birth: 1951/05/01   Medicare Observation Status Notification Given:  Yes    Gala LewandowskyGraves-Bigelow, Kapri Nero Kaye, RN 04/07/2016, 10:22 AM

## 2016-04-07 NOTE — Discharge Summary (Signed)
Discharge Summary    Patient ID: [name],  MRN: [id number], DOB/AGE: 1950/11/24 65 y.o.  Admit date: 04/05/2016 Discharge date: 04/07/2016  Primary Care Provider: Kerby Nora Primary Cardiologist: Dr. Mariah Milling    Discharge Diagnoses    Principal Problem:   NSTEMI (non-ST elevated myocardial infarction) [facility]) Active Problems:   Uncontrolled diabetes mellitus with retinopathy (HCC)   Hyperlipidemia LDL goal <70   OBESITY   Essential hypertension, benign   Morbid obesity (HCC)   Coronary artery disease, non-occlusive   Hyperlipemia   Allergies Allergies  Allergen Reactions  . Atorvastatin     REACTION: Body aches     History of Present Illness     [name] is a 65 y.o. male with a history of morbid obesity, HTN, DM, CAD manifested as NSTEMI in 11/2015 (PCI to LAD; was discharged on Plavix) and again 02/2016 (no PCI; was discharged on Brilinta), who presented to the [facility] ER on 04/05/16 for evaluation of chest pain and ruled in for NSTEMI.  He has a cardiac history dating back to 2002 at which time, he underwent diagnostic catheterization revealing no significant CAD. Since gone catheterization 2008 again showed nonobstructive CAD. He was admitted to [facility] in late April 2017 with a two-month history of progressive exertional chest discomfort. He ruled in for non-STEMI and subsequent underwent catheterization revealing a chronic total occlusion of the right coronary artery with severe proximal LAD stenosis. The LAD was felt to be the culprit vessel and this was successfully stented using a drug-eluting stent. He was placed on ASA and plavix.  He was admitted again in 02/2016 for NSTEMI but no culprit found on cath 03/10/16. Felt to be possibly demand ischemia in the setting of the patient's chronically occluded RCA with underlying hypertensive heart disease and moderately elevated LVEDP. He was switched to Brilinta.   He then re-presented to [facility] on 04/05/16 with  recurrent chest pain. He reported an episode of midsternal chest tightness while sitting associated with SOB. No syncope, diaphoresis. EMS called and symptoms improved with ASA and SL NTG. He reported not taking his last two doses of Brilinta (the night before and that morning) due to dyspnea. He was admitted for further work up and observation.     Hospital Course     Consultants: none  NSTEMI: troponin peaked at 4.75. He had missed two doses of Brilinta this week and had significant SOB associated with the drug. This was discontinued and he was loaded with plavix . He underwent repeat cath yesterday which showed no significant change in angiographic appearance of coronary arteries since previous study. He has a known CTO of the RCA that is well collateralized and widely patent proximal to mid LAD stent and 70% marginal stenosis. Plan is to continue dual antiplatelet therapy.  CAD s/p PCI to LAD in 11/2015 (for NSTEMI) and recurrent NSTEMI in 02/2016 (no PCI) and recurrent NSTEMI this admission (no PCI): continue ASA, plavix (SOB with Brilinta), BB and statin  DMT2: last HgA1c 8.1 in 01/2016. Cont home regimen    HTN: BP well controlled currently   HLD: LDL 139 on 02/16/16. He has been on pravastatin  daily and Zetia  daily. Has a listed allergy to atorvastatin. Consider trailing high dose Crestor or sending to lipid clinic for evaluation for PCSK9i candidacy with recurrent ischemic events.  Obesity: Body mass index is 46.38 kg/m.He will need to work on diet and exercise.   Snoring: likely has OSA but  has refused sleep study in the past.   The patient has had an uncomplicated hospital course and is recovering well. The radial catheter site is stable. He  has been seen by Dr. Allyson SabalBerry today and deemed ready for discharge home. All follow-up appointments have been scheduled. Discharge medications are listed below.  _____________  Discharge Vitals Blood pressure (!) 120/54, pulse  73, temperature 98.1 F (36.7 C), temperature source Oral, resp. rate 16, height 5\' 7"  (1.702 m), weight 295 lb (133.8 kg), SpO2 96 %.  Filed Weights   04/05/16 2310 04/06/16 0452 04/07/16 0340  Weight: 299 lb 12.8 oz (136 kg) 296 lb 1.6 oz (134.3 kg) 295 lb (133.8 kg)    Labs & Radiologic Studies     CBC  Recent Labs  04/06/16 0147 04/06/16 1518  WBC 8.6 6.7  HGB 12.1* 11.9*  HCT 36.8* 36.5*  MCV 90.9 89.9  PLT 193 186   Basic Metabolic Panel  Recent Labs  04/05/16 2015 04/06/16 0147 04/06/16 1518  NA 136  --   --   K 3.6  --   --   CL 104  --   --   CO2 24  --   --   GLUCOSE 366*  --   --   BUN 21*  --   --   CREATININE 1.18 1.16 1.04  CALCIUM 8.3*  --   --     Cardiac Enzymes  Recent Labs  04/06/16 0147 04/06/16 0750 04/06/16 1518  TROPONINI 2.74* 4.75* 3.50*     Dg Chest 2 View  Result Date: 04/05/2016 CLINICAL DATA:  Chest pain for several hours with diarrhea cysts and fatigue. Coronary artery disease and hypertension. EXAM: CHEST  2 VIEW COMPARISON:  03/09/2016 FINDINGS: Mild enlargement of the cardiopericardial silhouette, without edema. The degree of cardiomegaly is similar to the prior exam. The lungs appear clear.  No pleural effusion. IMPRESSION: 1. Stable mild cardiomegaly, without edema. Electronically Signed   By: Gaylyn RongWalter  Liebkemann M.D.   On: 04/05/2016 20:45   Dg Chest Portable 1 View  Result Date: 03/09/2016 CLINICAL DATA:  Central chest pain shortness of breath for 1 day, coronary disease, diabetes mellitus, hypertension, hyperlipidemia EXAM: PORTABLE CHEST 1 VIEW COMPARISON:  Portable exam 0844 hours compared to 12/24/2015 FINDINGS: Enlargement of cardiac silhouette. Mediastinal contours and pulmonary vascularity normal. Lungs clear. No pleural effusion or pneumothorax. Bones unremarkable. IMPRESSION: Enlargement of cardiac silhouette. No acute abnormalities. Electronically Signed   By: Ulyses SouthwardMark  Boles M.D.   On: 03/09/2016 09:01      Diagnostic Studies/Procedures    Procedures 04/06/16 Left Heart Cath and Coronary Angiography  Conclusion   Mid RCA to Dist RCA lesion, 100 %stenosed.  Mid LAD-2 lesion, 60 %stenosed.  Mid LAD-1 lesion, 20 %stenosed.  The left ventricular systolic function is normal.  The left ventricular ejection fraction is 50-55% by visual estimate.  LV end diastolic pressure is normal.  2nd [name] lesion, 75 %stenosed.  Chronic total occlusion of the right coronary in the proximal segment well collateralized from the left circumflex and LAD.  Widely patent proximal to mid LAD stent previously placed in July. There is first diagonal diagonal and LAD 30 and 50% narrowing respectively.  Widely patent circumflex unchanged from previous with 70% narrowing in a small branch of the first marginal. Circumflex collateralizes the distal right coronary left ventricular branch.  Inferobasal hypokinesis. EF 50%. EDP is normal.  Unable to identify a significant change in the angiographic appearance since prior study in  July. RECOMMENDATIONS:  Continuation of dual antiplatelet therapy is stressed.  Further management per treating team.     _____________    Disposition   Pt is being discharged home today in good condition.  Follow-up Plans & Appointments    Follow-up Information    Julien Nordmann, MD. Go on 04/18/2016.   Specialty:  Cardiology Why:  @ 8am Contact information: [address] [place] [address] [phone]            Discharge Medications     Medication List    TAKE these medications   amLODipine 5 MG tablet Commonly known as:  NORVASC Take 1 tablet (5 mg total) by mouth daily.   aspirin 81 MG EC tablet Take 1 tablet (81 mg total) by mouth daily.   carvedilol 6.25 MG tablet Commonly known as:  COREG Take 1 tablet (6.25 mg total) by mouth daily.   clopidogrel 75 MG tablet Commonly known as:  PLAVIX Take 1 tablet (75 mg total) by  mouth daily with breakfast.   ezetimibe 10 MG tablet Commonly known as:  ZETIA Take 1 tablet (10 mg total) by mouth daily.   furosemide 20 MG tablet Commonly known as:  LASIX Take 1 tablet (20 mg total) by mouth daily.   insulin NPH-regular Human (70-30) 100 UNIT/ML injection Commonly known as:  NOVOLIN 70/30 RELION Inject 80 units every morning and 70 units every evening What changed:  how much to take  how to take this  when to take this  additional instructions   isosorbide mononitrate 30 MG 24 hr tablet Commonly known as:  IMDUR Take 1 tablet (30 mg total) by mouth daily.   nitroGLYCERIN 0.4 MG SL tablet Commonly known as:  NITROSTAT Place 1 tablet (0.4 mg total) under the tongue every 5 (five) minutes as needed for chest pain.   potassium chloride SA 20 MEQ tablet Commonly known as:  K-DUR,KLOR-CON Take 1 tablet (20 mEq total) by mouth daily.   pravastatin 40 MG tablet Commonly known as:  PRAVACHOL Take 1 tablet (40 mg total) by mouth daily at 6 PM.   triamterene-hydrochlorothiazide 37.5-25 MG tablet Commonly known as:  MAXZIDE-25 Take 1 tablet by mouth daily.       Aspirin prescribed at discharge?  Yes High Intensity Statin Prescribed? (Lipitor 40-80mg  or Crestor 20-40mg ): No pravastain. Does not tolerate lipitor Beta Blocker Prescribed? Yes For EF 45% or less, Was ACEI/ARB Prescribed? No: normal EF ADP Receptor Inhibitor Prescribed? (i.e. Plavix etc.-Includes Medically Managed Patients): Yes For EF <45%, Aldosterone Inhibitor Prescribed? No, normal EF  Was EF assessed during THIS hospitalization? Yes Was Cardiac Rehab II ordered? (Included Medically managed Patients): No   Outstanding Labs/Studies   none  Duration of Discharge Encounter   Greater than 30 minutes including physician time.  Signed, Cline Crock PA-C 04/07/2016, 1:25 PM

## 2016-04-07 NOTE — Progress Notes (Signed)
Patient Name: DOMENIC SCHOENBERGER Date of Encounter: 04/07/2016     Active Problems:   Uncontrolled diabetes mellitus with retinopathy (HCC)   Hyperlipidemia LDL goal <70   Essential hypertension, benign   CAD (coronary artery disease)   Ischemic chest pain (HCC)    SUBJECTIVE  No chest pain or shortness of breath. Feeling much better off the Brilinta. Ready to go home.  CURRENT MEDS . amLODipine  5 mg Oral Daily  . aspirin  81 mg Oral Daily  . carvedilol  6.25 mg Oral Q breakfast  . clopidogrel  75 mg Oral Q breakfast  . ezetimibe  10 mg Oral Daily  . furosemide  20 mg Oral Daily  . heparin  5,000 Units Subcutaneous Q8H  . insulin aspart protamine- aspart  70 Units Subcutaneous BID WC  . isosorbide mononitrate  60 mg Oral Daily  . nitroGLYCERIN  1 inch Topical Q6H  . potassium chloride SA  20 mEq Oral Daily  . pravastatin  40 mg Oral q1800  . sodium chloride flush  3 mL Intravenous Q12H  . triamterene-hydrochlorothiazide  1 tablet Oral Daily    OBJECTIVE  Vitals:   04/06/16 1711 04/06/16 1741 04/06/16 1955 04/07/16 0340  BP: 125/60 (!) 115/54 131/65 (!) 120/54  Pulse:   78 73  Resp:   16 16  Temp:   97.9 F (36.6 C) 98.1 F (36.7 C)  TempSrc:   Oral Oral  SpO2: 97% 96% 97% 96%  Weight:    295 lb (133.8 kg)  Height:        Intake/Output Summary (Last 24 hours) at 04/07/16 0900 Last data filed at 04/07/16 0341  Gross per 24 hour  Intake              240 ml  Output             1750 ml  Net            -1510 ml   Filed Weights   04/05/16 2310 04/06/16 0452 04/07/16 0340  Weight: 299 lb 12.8 oz (136 kg) 296 lb 1.6 oz (134.3 kg) 295 lb (133.8 kg)    PHYSICAL EXAM  General: Pleasant, NAD. obese Neuro: Alert and oriented X 3. Moves all extremities spontaneously. Psych: Normal affect. HEENT:  Normal  Neck: Supple without bruits or JVD. Lungs:  Resp regular and unlabored, CTA. Heart: RRR no s3, s4, or murmurs. Abdomen: Soft, non-tender, non-distended, BS + x  4.  Extremities: No clubbing, cyanosis or edema. DP/PT/Radials 2+ and equal bilaterally.  Accessory Clinical Findings  CBC  Recent Labs  04/06/16 0147 04/06/16 1518  WBC 8.6 6.7  HGB 12.1* 11.9*  HCT 36.8* 36.5*  MCV 90.9 89.9  PLT 193 186   Basic Metabolic Panel  Recent Labs  04/05/16 2015 04/06/16 0147 04/06/16 1518  NA 136  --   --   K 3.6  --   --   CL 104  --   --   CO2 24  --   --   GLUCOSE 366*  --   --   BUN 21*  --   --   CREATININE 1.18 1.16 1.04  CALCIUM 8.3*  --   --    Liver Function Tests No results for input(s): AST, ALT, ALKPHOS, BILITOT, PROT, ALBUMIN in the last 72 hours. No results for input(s): LIPASE, AMYLASE in the last 72 hours. Cardiac Enzymes  Recent Labs  04/06/16 0147 04/06/16 0750 04/06/16 1518  TROPONINI 2.74*  4.75* 3.50*    TELE  NSR  Radiology/Studies  Dg Chest 2 View  Result Date: 04/05/2016 CLINICAL DATA:  Chest pain for several hours with diarrhea cysts and fatigue. Coronary artery disease and hypertension. EXAM: CHEST  2 VIEW COMPARISON:  03/09/2016 FINDINGS: Mild enlargement of the cardiopericardial silhouette, without edema. The degree of cardiomegaly is similar to the prior exam. The lungs appear clear.  No pleural effusion. IMPRESSION: 1. Stable mild cardiomegaly, without edema. Electronically Signed   By: Gaylyn Rong M.D.   On: 04/05/2016 20:45   Dg Chest Portable 1 View  Result Date: 03/09/2016 CLINICAL DATA:  Central chest pain shortness of breath for 1 day, coronary disease, diabetes mellitus, hypertension, hyperlipidemia EXAM: PORTABLE CHEST 1 VIEW COMPARISON:  Portable exam 0844 hours compared to 12/24/2015 FINDINGS: Enlargement of cardiac silhouette. Mediastinal contours and pulmonary vascularity normal. Lungs clear. No pleural effusion or pneumothorax. Bones unremarkable. IMPRESSION: Enlargement of cardiac silhouette. No acute abnormalities. Electronically Signed   By: Ulyses Southward M.D.   On: 03/09/2016  09:01    Procedures 04/06/16 Left Heart Cath and Coronary Angiography  Conclusion    Mid RCA to Dist RCA lesion, 100 %stenosed.  Mid LAD-2 lesion, 60 %stenosed.  Mid LAD-1 lesion, 20 %stenosed.  The left ventricular systolic function is normal.  The left ventricular ejection fraction is 50-55% by visual estimate.  LV end diastolic pressure is normal.  2nd [name] lesion, 75 %stenosed.  Chronic total occlusion of the right coronary in the proximal segment well collateralized from the left circumflex and LAD.  Widely patent proximal to mid LAD stent previously placed in July. There is first diagonal diagonal and LAD 30 and 50% narrowing respectively.  Widely patent circumflex unchanged from previous with 70% narrowing in a small branch of the first marginal. Circumflex collateralizes the distal right coronary left ventricular branch.  Inferobasal hypokinesis. EF 50%. EDP is normal.  Unable to identify a significant change in the angiographic appearance since prior study in July. RECOMMENDATIONS:  Continuation of dual antiplatelet therapy is stressed.  Further management per treating team.      ASSESSMENT AND PLAN 65 yo obese [place] man with morbid obesity, HTN, DM, CAD manifested as NSTEMI in 11/2015 (PCI to LAD; was discharged on Plavix) and again 02/2016 (no PCI; was discharged on Brilinta), who presented to the [facility] ER on 04/05/16 for evaluation of chest pain and now ruled in for NSTEMI.  NSTEMI: troponin peaked at 4.75. He had missed two doses of Brilinta this week and had significant SOB associated with the drug. This was discontinued and he was loaded with plavix . He underwent repeat cath yesterday which showed no significant change in angiographic appearance of coronary arteries since previous study. He has a known CTO of the RCA that is well collateralized and widely patent proximal to mid LAD stent and 70% marginal stenosis. Plan is to continue dual antiplatelet  therapy.  CAD s/p PCI to LAD in 11/2015 (for NSTEMI) and recurrent NSTEMI in 02/2016 (no PCI) and recurrent NSTEMI this admission (no PCI): continue ASA, plavix (SOB with Brilinta), BB and statin  DMT2: last HgA1c 8.1 in 01/2016. Cont home regimen    HTN: BP well controlled currently   HLD: LDL 139 on 02/16/16. He has been on pravastatin  daily and Zetia  daily. Has a listed allergy to atorvastatin. Consider trailing high dose Crestor or sending to lipid clinic for evaluation for PCSK9i candidacy with recurrent ischemic events.  Obesity: Body mass index is  46.38 kg/m. He will need to work on diet and exercise.   Signed, Cline CrockKathryn Thompson PA-C  Pager [phone]  .Agree with note by Carlean JewsKatie Thompson PA-C  Results of cath noted Widely patent prox LAD stent, chronic total RCA with L---> R collat and OM dz. Trop trending down. No further CP. On DAPT. OK for DC home today. ROV with DR. Gollan (already scheduled).   Runell GessJonathan J. Berry, M.D., FACP, [facility]FACC, Earl LagosFAHA, [facility]SC[facility] [address] [place], [place]  [id number]  [phone] 04/07/2016 9:33 AM

## 2016-04-18 ENCOUNTER — Encounter: Payer: Self-pay | Admitting: Cardiovascular Disease

## 2016-04-18 ENCOUNTER — Ambulatory Visit (INDEPENDENT_AMBULATORY_CARE_PROVIDER_SITE_OTHER): Payer: PPO | Admitting: Cardiovascular Disease

## 2016-04-18 VITALS — BP 110/50 | HR 80 | Ht 66.0 in | Wt 298.2 lb

## 2016-04-18 DIAGNOSIS — E1159 Type 2 diabetes mellitus with other circulatory complications: Secondary | ICD-10-CM

## 2016-04-18 DIAGNOSIS — E785 Hyperlipidemia, unspecified: Secondary | ICD-10-CM | POA: Diagnosis not present

## 2016-04-18 DIAGNOSIS — E1165 Type 2 diabetes mellitus with hyperglycemia: Secondary | ICD-10-CM

## 2016-04-18 DIAGNOSIS — I214 Non-ST elevation (NSTEMI) myocardial infarction: Secondary | ICD-10-CM

## 2016-04-18 DIAGNOSIS — Z794 Long term (current) use of insulin: Secondary | ICD-10-CM

## 2016-04-18 DIAGNOSIS — IMO0002 Reserved for concepts with insufficient information to code with codable children: Secondary | ICD-10-CM

## 2016-04-18 DIAGNOSIS — I10 Essential (primary) hypertension: Secondary | ICD-10-CM

## 2016-04-18 DIAGNOSIS — R0602 Shortness of breath: Secondary | ICD-10-CM

## 2016-04-18 DIAGNOSIS — I25110 Atherosclerotic heart disease of native coronary artery with unstable angina pectoris: Secondary | ICD-10-CM

## 2016-04-18 DIAGNOSIS — I119 Hypertensive heart disease without heart failure: Secondary | ICD-10-CM

## 2016-04-18 MED ORDER — ROSUVASTATIN CALCIUM 40 MG PO TABS: 40.0000 mg | ORAL_TABLET | Freq: Every day | ORAL | 11 refills | Status: DC

## 2016-04-18 NOTE — Patient Instructions (Addendum)
Medication Instructions:   Please hold the pravastatin Start crestor 1/2 pill for a few weeks, then up to a full pill Stay on zetia   lasix/furosemide as needed for leg swelling, breathing  Labwork:  No new labs  Testing/Procedures:  We [facility], recent MI with stent, with symptoms of fatigue, shortness of breath   Follow-Up: It was a pleasure seeing you in the office today. Please call us if you have new issues that need to be addressed before your next appt.  [phone]  Your physician wants you to follow-up in: 6 months.  You will receive a reminder letter in the mail two months in advance. If you don't receive a letter, please call our office to schedule the follow-up appointment.  If you need a refill on your cardiac medications before your next appointment, please call your pharmacy.

## 2016-04-18 NOTE — Progress Notes (Signed)
Cardiology Office Note  Date:  04/18/2016   ID:  [name], DOB 1950-11-15, MRN [id number]  PCP:  Kerby NoraAmy Bedsole, MD   Chief Complaint  Patient presents with  . Other    F/u ED C/o fatigue, no energy and blurred vision with furosemide. Meds reviewed verbally with pt.    HPI:  William HuskJohn H [name] is a 65 y.o. male with a history of morbid obesity, HTN, DM, CAD manifested as NSTEMI in 11/2015 (PCI to LAD; was discharged on Plavix) and again 02/2016 (no PCI; was discharged on Brilinta), who presented to the [facility] ER on 04/05/16 for NSTEMI. He presents to establish care in the [place] office for his Coronary artery disease  He reports feeling very tired, short of breath on exertion   has not recovered from previous RCA infarct Reports he needs to take care of his wife, works on a chicken farm, unable to get going. He denies any lower extremity edema, no abdominal bloating, no other symptoms concerning for fluid retention. Presumably started on Lasix for elevated LVEDP  Has side effect on Lasix, causes blurry vision Previously had problems on brilinta, this caused shortness of breath Most recent catheterization 04/06/2016, stable LAD stent, occluded RCA with collaterals There was discussion about changing his cholesterol management. Was discharged on the same medications  Cath 04/06/16 EF 50%  Chronic total occlusion of the right coronary in the proximal segment well collateralized from the left circumflex and LAD.  Widely patent proximal to mid LAD stent previously placed in July. There is first diagonal diagonal and LAD 30 and 50% narrowing respectively.  Widely patent circumflex unchanged from previous with 70% narrowing in a small branch of the first marginal. Circumflex collateralizes the distal right coronary left ventricular branch.  Inferobasal hypokinesis. EF 50%. EDP is normal.   2002 diagnostic catheterization revealing no significant CAD.  catheterization 2008 again showed  nonobstructive CAD.    Other past medical history reviewed admitted to [facility] in late April 2017 with a two-month history of progressive exertional chest discomfort.  ruled in for non-STEMI and subsequent underwent catheterization revealing a chronic total occlusion of the right coronary artery with severe proximal LAD stenosis. The LAD was felt to be the culprit vessel and this was successfully stented using a drug-eluting stent. He was placed on ASA and plavix.  admitted again in 02/2016 for NSTEMI but no culprit found on cath 03/10/16. Felt to be possibly demand ischemia in the setting of the patient's chronically occluded RCA with underlying hypertensive heart disease and moderately elevated LVEDP. He was switched to Brilinta.   re[facility] on 04/05/16 with recurrent chest pain.    PMH:   has a past medical history of Back injury (02/2002); Coronary artery disease, non-occlusive; Depression; Diabetes mellitus type 2, insulin dependent (HCC); Hyperlipemia; Hypertensive heart disease; Kidney stones; Morbid obesity (HCC); Osteoarthritis; and Snoring.  PSH:    Past Surgical History:  Procedure Laterality Date  . CARDIAC CATHETERIZATION  09/2000   diffuse LAD 30% LCA  EF 50-60%  . CARDIAC CATHETERIZATION  06/2007   no significant CAD  . CARDIAC CATHETERIZATION N/A 12/25/2015   Procedure: Left Heart Cath and Coronary Angiography;  Surgeon: Antonieta Ibaimothy J Kameran Lallier, MD;  Location: [facility] INVASIVE CV LAB;  Service: Cardiovascular;  Laterality: N/A;  . CARDIAC CATHETERIZATION N/A 12/25/2015   Procedure: Coronary Stent Intervention;  Surgeon: Alwyn Peawayne D Callwood, MD;  Location: [facility] INVASIVE CV LAB;  Service: Cardiovascular;  Laterality: N/A;  . CARDIAC CATHETERIZATION N/A  03/10/2016   Procedure: Left Heart Cath and Coronary Angiography;  Surgeon: Iran OuchMuhammad A Arida, MD;  Location: [facility] INVASIVE CV LAB;  Service: Cardiovascular;  Laterality: N/A;  . CARDIAC CATHETERIZATION N/A 04/06/2016    Procedure: Left Heart Cath and Coronary Angiography;  Surgeon: Lyn RecordsHenry W Smith, MD;  [facility];  Service: Cardiovascular;  Laterality: N/A;  . CIRCUMCISION      Current Outpatient Prescriptions  Medication Sig Dispense Refill  . amLODipine (NORVASC) 5 MG tablet Take 1 tablet (5 mg total) by mouth daily. 30 tablet 5  . aspirin 81 MG EC tablet Take 1 tablet (81 mg total) by mouth daily.    . carvedilol (COREG) 6.25 MG tablet Take 1 tablet (6.25 mg total) by mouth daily. 90 tablet 1  . clopidogrel (PLAVIX) 75 MG tablet Take 1 tablet (75 mg total) by mouth daily with breakfast. 90 tablet 3  . ezetimibe (ZETIA) 10 MG tablet Take 1 tablet (10 mg total) by mouth daily. 90 tablet 1  . furosemide (LASIX) 20 MG tablet Take 1 tablet (20 mg total) by mouth daily. 30 tablet 5  . insulin NPH-regular Human (NOVOLIN 70/30 RELION) (70-30) 100 UNIT/ML injection Inject 80 units every morning and 70 units every evening (Patient taking differently: Inject 60-70 Units into the skin 2 (two) times daily. Inject 70 units every morning and 60 units every evening) 4 vial 11  . isosorbide mononitrate (IMDUR) 30 MG 24 hr tablet Take 1 tablet (30 mg total) by mouth daily. 30 tablet 5  . nitroGLYCERIN (NITROSTAT) 0.4 MG SL tablet Place 1 tablet (0.4 mg total) under the tongue every 5 (five) minutes as needed for chest pain. 20 tablet 2  . potassium chloride SA (K-DUR,KLOR-CON) 20 MEQ tablet Take 1 tablet (20 mEq total) by mouth daily. 30 tablet 5  . pravastatin (PRAVACHOL) 40 MG tablet Take 1 tablet (40 mg total) by mouth daily at 6 PM. 30 tablet 5  . triamterene-hydrochlorothiazide (MAXZIDE-25) 37.5-25 MG tablet Take 1 tablet by mouth daily.     No current facility-administered medications for this visit.      Allergies:   Atorvastatin   Social History:  The patient  reports that he has never smoked. He has never used smokeless tobacco. He reports that he does not drink alcohol or use drugs.   Family  History:   family history includes Alzheimer's disease in his mother; Cancer in his brother; Diabetes in his father; Emphysema in his mother; Heart disease in his father.    Review of Systems: Review of Systems  Constitutional: Negative.   Respiratory: Negative.   Cardiovascular: Negative.   Gastrointestinal: Negative.   Musculoskeletal: Negative.   Neurological: Negative.   Psychiatric/Behavioral: Negative.   All other systems reviewed and are negative.    PHYSICAL EXAM: VS:  BP (!) 110/50 (BP Location: Left Arm, Patient Position: Sitting, Cuff Size: Large)   Pulse 80   Ht 5\' 6"  (1.676 m)   Wt 298 lb 4 oz (135.3 kg)   BMI 48.14 kg/m  , BMI Body mass index is 48.14 kg/m. GEN: Well nourished, well developed, in no acute distress , obese HEENT: normal  Neck: no JVD, there is 1+ carotid bruit on the right, no masses Cardiac: RRR; no murmurs, rubs, or gallops,no edema  Respiratory:  clear to auscultation bilaterally, normal work of breathing GI: soft, nontender, nondistended, + BS MS: no deformity or atrophy  Skin: warm and dry, no rash Neuro:  Strength and sensation are  intact Psych: euthymic mood, full affect    Recent Labs: 03/09/2016: ALT 24 04/05/2016: BUN 21; Potassium 3.6; Sodium 136 04/06/2016: B Natriuretic Peptide 46.1; Creatinine, Ser 1.04; Hemoglobin 11.9; Platelets 186    Lipid Panel Lab Results  Component Value Date   CHOL 197 02/16/2016   HDL 33.20 (L) 02/16/2016   LDLCALC 139 (H) 02/16/2016   TRIG 122.0 02/16/2016      Wt Readings from Last 3 Encounters:  04/18/16 298 lb 4 oz (135.3 kg)  04/07/16 295 lb (133.8 kg)  03/24/16 298 lb 12 oz (135.5 kg)       ASSESSMENT AND PLAN:  NSTEMI: Recent  troponin peaked at 4.75.August 2017  missed two doses of Brilinta and had significant SOB associated with the drug repeat cath  showed no significant change in angiographic appearance of coronary arteries   known CTO of the RCA that is well  collateralized andwidely patent proximal to mid LAD stent and 70% marginal stenosis.  Doing well on aspirin and Plavix   CAD s/p PCI to LAD in 11/2015 (for NSTEMI) and recurrent NSTEMI in 02/2016 (no PCI) and recurrent NSTEMI this admission (no PCI): continue ASA, plavix (SOB with Brilinta), BB and statin  currently with no symptoms of angina  DMT2:  last HgA1c 8.1 in 01/2016.  Cont home regimen  Recommended close follow-up with primary care for aggressive diabetes control   HTN:  Blood pressure is well controlled on today's visit. No changes made to the medications.   HLD: LDL 139 on 02/16/16.  Recommended he stop pravastatin, start Crestor 20 mg daily for several weeks then increase up to 40 mg daily stay on Zetia.    morbid Obesity:  Body mass index is 46.38 kg/m.He will need to work on diet and exercise.  Referral made to cardiac rehabilitation  Snoring:  likely has OSA but has refused sleep study in the past.   Shortness of breath  Recent RCA infarct,  Deconditioning . Referral made to cardiac rehabilitation at [facility] at his request    Total encounter time more than 25 minutes  Greater than 50% was spent in counseling and coordination of care with the patient   Disposition:   F/U  6 months  Orders Placed This Encounter  Procedures  . EKG 12-Lead     Signed, Dossie Arbour, M.D., Ph.D. 04/18/2016  [facility] [place], [place] [phone]

## 2016-05-01 ENCOUNTER — Other Ambulatory Visit: Payer: Self-pay

## 2016-05-01 ENCOUNTER — Encounter (HOSPITAL_COMMUNITY): Payer: Self-pay

## 2016-05-01 ENCOUNTER — Emergency Department (HOSPITAL_COMMUNITY): Payer: PPO

## 2016-05-01 ENCOUNTER — Emergency Department (HOSPITAL_COMMUNITY)
Admission: EM | Admit: 2016-05-01 | Discharge: 2016-05-01 | Disposition: A | Discharge status: Home / Self Care | Payer: PPO | Attending: Emergency Medicine | Admitting: Emergency Medicine

## 2016-05-01 DIAGNOSIS — M1990 Unspecified osteoarthritis, unspecified site: Secondary | ICD-10-CM | POA: Diagnosis not present

## 2016-05-01 DIAGNOSIS — R079 Chest pain, unspecified: Secondary | ICD-10-CM | POA: Diagnosis not present

## 2016-05-01 DIAGNOSIS — I252 Old myocardial infarction: Secondary | ICD-10-CM | POA: Insufficient documentation

## 2016-05-01 DIAGNOSIS — Z9111 Patient's noncompliance with dietary regimen: Secondary | ICD-10-CM | POA: Insufficient documentation

## 2016-05-01 DIAGNOSIS — I200 Unstable angina: Secondary | ICD-10-CM

## 2016-05-01 DIAGNOSIS — I440 Atrioventricular block, first degree: Secondary | ICD-10-CM | POA: Insufficient documentation

## 2016-05-01 DIAGNOSIS — E1165 Type 2 diabetes mellitus with hyperglycemia: Secondary | ICD-10-CM | POA: Diagnosis not present

## 2016-05-01 DIAGNOSIS — Z9114 Patient's other noncompliance with medication regimen: Secondary | ICD-10-CM | POA: Diagnosis not present

## 2016-05-01 DIAGNOSIS — Z888 Allergy status to other drugs, medicaments and biological substances status: Secondary | ICD-10-CM | POA: Diagnosis not present

## 2016-05-01 DIAGNOSIS — R918 Other nonspecific abnormal finding of lung field: Secondary | ICD-10-CM | POA: Diagnosis not present

## 2016-05-01 DIAGNOSIS — Z9889 Other specified postprocedural states: Secondary | ICD-10-CM | POA: Diagnosis not present

## 2016-05-01 DIAGNOSIS — G4733 Obstructive sleep apnea (adult) (pediatric): Secondary | ICD-10-CM | POA: Insufficient documentation

## 2016-05-01 DIAGNOSIS — Z9119 Patient's noncompliance with other medical treatment and regimen: Secondary | ICD-10-CM | POA: Insufficient documentation

## 2016-05-01 DIAGNOSIS — Z794 Long term (current) use of insulin: Secondary | ICD-10-CM | POA: Insufficient documentation

## 2016-05-01 DIAGNOSIS — R0789 Other chest pain: Secondary | ICD-10-CM | POA: Diagnosis not present

## 2016-05-01 DIAGNOSIS — Z7982 Long term (current) use of aspirin: Secondary | ICD-10-CM | POA: Insufficient documentation

## 2016-05-01 DIAGNOSIS — I10 Essential (primary) hypertension: Secondary | ICD-10-CM | POA: Diagnosis not present

## 2016-05-01 DIAGNOSIS — E11319 Type 2 diabetes mellitus with unspecified diabetic retinopathy without macular edema: Secondary | ICD-10-CM | POA: Insufficient documentation

## 2016-05-01 DIAGNOSIS — Z79899 Other long term (current) drug therapy: Secondary | ICD-10-CM | POA: Insufficient documentation

## 2016-05-01 DIAGNOSIS — Z87442 Personal history of urinary calculi: Secondary | ICD-10-CM | POA: Insufficient documentation

## 2016-05-01 DIAGNOSIS — M79605 Pain in left leg: Secondary | ICD-10-CM | POA: Diagnosis not present

## 2016-05-01 DIAGNOSIS — E785 Hyperlipidemia, unspecified: Secondary | ICD-10-CM | POA: Insufficient documentation

## 2016-05-01 DIAGNOSIS — E1159 Type 2 diabetes mellitus with other circulatory complications: Secondary | ICD-10-CM | POA: Insufficient documentation

## 2016-05-01 DIAGNOSIS — Z955 Presence of coronary angioplasty implant and graft: Secondary | ICD-10-CM | POA: Insufficient documentation

## 2016-05-01 DIAGNOSIS — I25110 Atherosclerotic heart disease of native coronary artery with unstable angina pectoris: Secondary | ICD-10-CM | POA: Insufficient documentation

## 2016-05-01 LAB — BASIC METABOLIC PANEL
Anion gap: 5 (ref 5–15)
BUN: 25 mg/dL — ABNORMAL HIGH (ref 6–20)
CO2: 27 mmol/L (ref 22–32)
Calcium: 9.2 mg/dL (ref 8.9–10.3)
Chloride: 107 mmol/L (ref 101–111)
Creatinine, Ser: 1.15 mg/dL (ref 0.61–1.24)
GFR calc Af Amer: >60 mL/min (ref >60)
GFR calc non Af Amer: >60 mL/min (ref >60)
Glucose, Bld: 279 mg/dL — ABNORMAL HIGH (ref 65–99)
Potassium: 4.7 mmol/L (ref 3.5–5.1)
Sodium: 139 mmol/L (ref 135–145)

## 2016-05-01 LAB — CBC
HCT: 36.7 % — ABNORMAL LOW (ref 39.0–52.0)
Hemoglobin: 11.8 g/dL — ABNORMAL LOW (ref 13.0–17.0)
MCH: 29.6 pg (ref 26.0–34.0)
MCHC: 32.2 g/dL (ref 30.0–36.0)
MCV: 92.2 fL (ref 78.0–100.0)
Platelets: 189 10*3/uL (ref 150–400)
RBC: 3.98 MIL/uL — ABNORMAL LOW (ref 4.22–5.81)
RDW: 14 % (ref 11.5–15.5)
WBC: 6.8 10*3/uL (ref 4.0–10.5)

## 2016-05-01 LAB — I-STAT TROPONIN, ED: Troponin i, poc: 0 ng/mL (ref 0.00–0.08)

## 2016-05-01 MED ORDER — INSULIN ASPART 100 UNIT/ML ~~LOC~~ SOLN: 0.0000 [IU] | Freq: Three times a day (TID) | SUBCUTANEOUS | Status: DC

## 2016-05-01 NOTE — ED Notes (Signed)
Pt requesting to leave. States does not want to be admitted. RN notified Mayford Knifeurner, MD. MD states must sign AMA. Pt informed.

## 2016-05-01 NOTE — ED Notes (Signed)
Critical Value reported to Madilyn Hookees, MD

## 2016-05-01 NOTE — ED Triage Notes (Signed)
To hallway via EMS.  Onset 12 noon today chest pain and nausea.  Onset 24 hours of weakness, fatigue.  Pt missed church d/t not "feeling good".  Pt states "feels like last heart attack".  Pt took NTG x 1 and ASA 324 mg.  EMS gave NTG x 2 and pain resolved.  Pt c/o achy shoulders and back.

## 2016-05-01 NOTE — ED Provider Notes (Signed)
[facility]-EM[facility] DEPT Provider Note   CSN: [id number] Arrival date & time: 05/01/16  1410     History   Chief Complaint Chief Complaint  Patient presents with  . Chest Pain    HPI [name] is a 65 y.o. male.  [name] is a 65 y.o. male with h/o T2DM, HLD, obesity, HTN, CAD s/p NSTEMI 4/17 - PCI to LAD, 7/17, and 8/17, total occlusion of RCA presents to ED with complaint of chest pain. Patient states he woke up this morning "not feeling well" with generalized fatigue. Around noon today he started been experiencing centralized chest pain described as a pressure sensation, 8-9/10, with radiation to shoulders, arms and back and associated shortness of breath. Patient states he has had 3 heart attacks over the last 5 months and states symptoms are the exact same as his heart attacks. He took 2 baby aspirin and one nitroglycerin which eased the pain. EMS provided another 2 nitroglycerin which has resolved his chest pain. He does endorse bilateral lower extremity swelling that has improved. He denies any missed medication doses. No fever, nausea, vomiting, abdominal pain, trouble swallowing, changes in vision, syncope, headache, numbness, weakness, dysuria, hematuria, cough. Patient is followed by Dr. Mariah Milling.       Past Medical History:  Diagnosis Date  . Back injury 02/2002   worker's comp  . Coronary artery disease, non-occlusive    a. cath 2002 with no sig CAD;  b. cath 2008 normal LM, LAD, LCx, p&dRCA 20-30%, PDA 30%; c.11/2015 NSTEMI/PCI: LM nl, LAD 95p (2.5x15 Xience DES), LCX nl, RCA 100p/m w/ L->R collats, EF 55-65% c. NSTEMI (02/2016) with no culprit leision, switched to Brilinta.  d. NSTEMI 03/2016: again, no culprit lesion and switched back to plavix 2/2 SOB with Brilnta.    . Depression   . Diabetes mellitus type 2, insulin dependent (HCC)   . Hyperlipemia   . Hypertensive heart disease   . Kidney stones   . Morbid obesity (HCC)   . Osteoarthritis   . Snoring      Patient Active Problem List   Diagnosis Date Noted  . Coronary artery disease, non-occlusive   . Hyperlipemia   . Elevated left ventricular end-diastolic pressure (LVEDP) 03/11/2016  . Leg swelling 03/11/2016  . Anemia 03/11/2016  . Essential hypertension   . Statin intolerance   . Hypertensive heart disease   . Diabetes mellitus type 2, insulin dependent (HCC)   . Coronary artery disease involving native coronary artery of native heart with unstable angina pectoris (HCC)   . Morbid obesity (HCC)   . NSTEMI (non-ST elevated myocardial infarction) (HCC)   . Uncontrolled type 2 diabetes mellitus with circulatory disorder (HCC)   . Advanced care planning/counseling discussion 11/20/2015  . Noncompliance with diabetes treatment 11/05/2015  . Noncompliance with diet and medication regimen 11/05/2015  . Right medial knee pain 09/23/2014  . Diabetic retinopathy (HCC) 09/23/2014  . Snoring 12/21/2012  . HYPOGONADISM 09/28/2010  . VITAMIN B12 DEFICIENCY 09/28/2010  . UNSPECIFIED VITAMIN D DEFICIENCY 09/28/2010  . OTHER MALAISE AND FATIGUE 09/17/2010  . TRIGGER FINGER, RIGHT MIDDLE 09/14/2010  . BRANCH RETINAL VEIN OCCLUSION 11/26/2009  . LEG PAIN, BILATERAL 10/06/2009  . CONSTIPATION 08/10/2009  . GASTRITIS 07/29/2009  . Major depressive disorder, recurrent episode, moderate (HCC) 07/06/2007  . ERECTILE DYSFUNCTION 04/10/2007  . Uncontrolled diabetes mellitus with retinopathy (HCC) 03/26/2007  . OBESITY 03/26/2007  . RENAL CALCULUS, HX OF 03/26/2007  . Hyperlipidemia LDL goal <70 12/05/2006  .  Essential hypertension, benign 12/05/2006  . OSTEOARTHRITIS 12/05/2006    Past Surgical History:  Procedure Laterality Date  . CARDIAC CATHETERIZATION  09/2000   diffuse LAD 30% LCA  EF 50-60%  . CARDIAC CATHETERIZATION  06/2007   no significant CAD  . CARDIAC CATHETERIZATION N/A 12/25/2015   Procedure: Left Heart Cath and Coronary Angiography;  Surgeon: Antonieta Iba, MD;   Location: [facility] INVASIVE CV LAB;  Service: Cardiovascular;  Laterality: N/A;  . CARDIAC CATHETERIZATION N/A 12/25/2015   Procedure: Coronary Stent Intervention;  Surgeon: Alwyn Pea, MD;  Location: [facility] INVASIVE CV LAB;  Service: Cardiovascular;  Laterality: N/A;  . CARDIAC CATHETERIZATION N/A 03/10/2016   Procedure: Left Heart Cath and Coronary Angiography;  Surgeon: Iran Ouch, MD;  Location: [facility] INVASIVE CV LAB;  Service: Cardiovascular;  Laterality: N/A;  . CARDIAC CATHETERIZATION N/A 04/06/2016   Procedure: Left Heart Cath and Coronary Angiography;  Surgeon: Lyn Records, MD;  Location: [facility] INVASIVE CV LAB;  Service: Cardiovascular;  Laterality: N/A;  . CIRCUMCISION         Home Medications    Prior to Admission medications   Medication Sig Start Date End Date Taking? Authorizing Provider  amLODipine (NORVASC) 5 MG tablet Take 1 tablet (5 mg total) by mouth daily. 01/12/16  Yes Ok Anis, NP  aspirin 81 MG EC tablet Take 1 tablet (81 mg total) by mouth daily. 01/12/16  Yes Ok Anis, NP  carvedilol (COREG) 6.25 MG tablet Take 1 tablet (6.25 mg total) by mouth daily. 03/24/16  Yes Amy Michelle Nasuti, MD  clopidogrel (PLAVIX) 75 MG tablet Take 1 tablet (75 mg total) by mouth daily with breakfast. 04/07/16  Yes Janetta Hora, PA-C  ezetimibe (ZETIA) 10 MG tablet Take 1 tablet (10 mg total) by mouth daily. 03/24/16  Yes Amy Michelle Nasuti, MD  furosemide (LASIX) 20 MG tablet Take 1 tablet (20 mg total) by mouth daily. 03/11/16  Yes Katharina Caper, MD  insulin NPH-regular Human (NOVOLIN 70/30 RELION) (70-30) 100 UNIT/ML injection Inject 80 units every morning and 70 units every evening Patient taking differently: Inject 60-70 Units into the skin 2 (two) times daily. Inject 70 units every morning and 60 units every evening 03/28/16  Yes Amy E Bedsole, MD  isosorbide mononitrate (IMDUR) 30 MG 24 hr tablet Take 1 tablet (30 mg total) by mouth daily. 03/11/16  Yes Katharina Caper,  MD  nitroGLYCERIN (NITROSTAT) 0.4 MG SL tablet Place 1 tablet (0.4 mg total) under the tongue every 5 (five) minutes as needed for chest pain. 12/26/15  Yes Enedina Finner, MD  potassium chloride SA (K-DUR,KLOR-CON) 20 MEQ tablet Take 1 tablet (20 mEq total) by mouth daily. 03/11/16  Yes Katharina Caper, MD  pravastatin (PRAVACHOL) 40 MG tablet Take 40 mg by mouth daily.   Yes Historical Provider, MD  triamterene-hydrochlorothiazide (MAXZIDE-25) 37.5-25 MG tablet Take 1 tablet by mouth daily.   Yes Historical Provider, MD  rosuvastatin (CRESTOR) 40 MG tablet Take 1 tablet (40 mg total) by mouth daily. Patient not taking: Reported on 05/01/2016 04/18/16 04/18/17  Antonieta Iba, MD    Family History Family History  Problem Relation Age of Onset  . Alzheimer's disease Mother   . Emphysema Mother   . Diabetes Father   . Heart disease Father     MI  . Cancer Brother     ? Neck cancer    Social History Social History  Substance Use Topics  . Smoking status: Never Smoker  .  Smokeless tobacco: Never Used  . Alcohol use No     Allergies   Atorvastatin   Review of Systems Review of Systems  Constitutional: Positive for fatigue. Negative for chills, diaphoresis and fever.  HENT: Negative for trouble swallowing.   Eyes: Negative for visual disturbance.  Respiratory: Positive for shortness of breath.   Cardiovascular: Positive for chest pain and leg swelling.  Gastrointestinal: Negative for abdominal pain, blood in stool, constipation, diarrhea, nausea and vomiting.  Genitourinary: Negative for dysuria and hematuria.  Musculoskeletal: Positive for arthralgias and myalgias. Negative for neck pain.  Skin: Negative for rash.  Neurological: Negative for syncope and headaches.     Physical Exam Updated Vital Signs BP 131/95   Pulse 74   Temp 98.2 F (36.8 C) (Oral)   SpO2 96%   Physical Exam  Constitutional: He appears well-developed and well-nourished. No distress.  HENT:  Head:  Normocephalic and atraumatic.  Mouth/Throat: Oropharynx is clear and moist. No oropharyngeal exudate.  Eyes: Conjunctivae and EOM are normal. Pupils are equal, round, and reactive to light. Right eye exhibits no discharge. Left eye exhibits no discharge. No scleral icterus.  Neck: Normal range of motion and phonation normal. Neck supple. No neck rigidity. Normal range of motion present.  Cardiovascular: Normal rate, regular rhythm and normal heart sounds.   No murmur heard. Pulses:      Radial pulses are 2+ on the right side, and 2+ on the left side.       Dorsalis pedis pulses are 1+ on the right side, and 1+ on the left side.  Pulmonary/Chest: Effort normal and breath sounds normal. No stridor. No respiratory distress. He has no wheezes. He has no rales.  Abdominal: Soft. Bowel sounds are normal. He exhibits no distension. There is no tenderness. There is no rigidity, no rebound, no guarding and no CVA tenderness.  Musculoskeletal: Normal range of motion. He exhibits edema ( 1+ b/l lower extremity edema).  Lymphadenopathy:    He has no cervical adenopathy.  Neurological: He is alert. He is not disoriented. Coordination and gait normal. GCS eye subscore is 4. GCS verbal subscore is 5. GCS motor subscore is 6.  Skin: Skin is warm and dry. He is not diaphoretic.  Psychiatric: He has a normal mood and affect. His behavior is normal.    ED Treatments / Results  Labs (all labs ordered are listed, but only abnormal results are displayed) Labs Reviewed  BASIC METABOLIC PANEL - Abnormal; Notable for the following:       Result Value   Glucose, Bld 279 (*)    BUN 25 (*)    All other components within normal limits  CBC - Abnormal; Notable for the following:    RBC 3.98 (*)    Hemoglobin 11.8 (*)    HCT 36.7 (*)    All other components within normal limits  I-STAT TROPOININ, ED    EKG  EKG Interpretation  Date/Time:  Sunday May 01 2016 13:57:44 EDT Ventricular Rate:  75 PR  Interval:  226 QRS Duration: 78 QT Interval:  388 QTC Calculation: 433 R Axis:   11 Text Interpretation:  Sinus rhythm with 1st degree A-V block Otherwise normal ECG Confirmed by Lincoln Brigham [phone]) on 05/01/2016 2:51:31 PM       Radiology Dg Chest 2 View  Result Date: 05/01/2016 CLINICAL DATA:  65 year old male with a history of chest pain EXAM: CHEST  2 VIEW COMPARISON:  04/05/2016 FINDINGS: Cardiomediastinal silhouette unchanged. Similar appearance of low  lung volumes with crowded interstitial markings and fullness of the central vasculature. No pneumothorax or pleural effusion. No confluent airspace disease. Coarsened interstitial markings. No displaced fracture. IMPRESSION: Low lung volumes and chronic lung changes without evidence of superimposed acute cardiopulmonary disease. Signed, Yvone NeuJaime S. Loreta AveWagner, DO [facility] Electronically Signed   By: Gilmer MorJaime  Wagner D.O.   On: 05/01/2016 14:48    Procedures Procedures (including critical care time)  Medications Ordered in ED Medications - No data to display   Initial Impression / Assessment and Plan / ED Course  I have reviewed the triage vital signs and the nursing notes.  Pertinent labs & imaging results that were available during my care of the patient were reviewed by me and considered in my medical decision making (see chart for details).  Clinical Course    Patient presents to ED complaint of chest pain. Patient is afebrile and non-toxic appearing in NAD. Vital signs are mobile for mildly elevated blood pressure otherwise stable. Exam remarkable for mild bilateral lower extremity swelling. Review of records show patient has extensive cardiac history with 3 NSTEMI's in the last 5 months, he underwent PCI of the LAD following his initial NSTEMI. During his last NSTEMI in August, initial troponin was negative, repeat troponin elevated to 2.7. Concern for ACS. Will order labs, EKG,  chest x-ray.  Troponin negative. EKG shows 1st degree AV block. Heart score calculated to be 6-7. CBC and BMP reassuring. Chest x-ray negative for acute cardiopulmonary process. Given cardiac h/o will consult cardiology.   4:21 PM: Spoke with Dr. Diona BrownerMcDowell, appreciate his time. Cardiology to see patient.   5:29 PM: Patient seen by Dr. Gala RomneyBensimhon, greatly appreciate his time and input. Patient to be admitted for serial troponins and further evaluation and management of chest pain.    Final Clinical Impressions(s) / ED Diagnoses   Final diagnoses:  Chest pain, unspecified chest pain type    New Prescriptions New Prescriptions   No medications on file     Lona Kettleshley Laurel Lucindia Lemley, PA-C 05/01/16 1732    Tilden FossaElizabeth Rees, MD 05/02/16 [phone]

## 2016-05-01 NOTE — H&P (Signed)
CARDIOLOGY H&P  HPI:  [name] a 65 y.o.malewith a history ofmorbid obesity, HTN, DM, CAD manifested as NSTEMI in 11/2015 (PCI to LAD; was discharged on Plavix) who presents with recurrent CP.   Had NSTEMI in 4/17 and had PCI of LAD. Readmitted in 7/17 with CP. Relook cath with patent LAD stent and CTO RCA.   Readmitted in early August 2017 with NSTEMI. Peak troponin 4.7. Relook cath once again stable with no culprit felt to be possibly demand ischemia in setting of RCA CTO. Initially switched to Brilinta but unable to tolerate and switched back to Plaxix and ASA.   Cath 04/06/16 EF 50%  Chronic total occlusion of the right coronary in the proximal segment well collateralized from the left circumflex and LAD.  Widely patent proximal to mid LAD stent previously placed in July. There is first diagonal diagonal and LAD 30 and 50% narrowing respectively.  Widely patent circumflex unchanged from previous with 70% narrowing in a small branch of the first marginal. Circumflex collateralizes the distal right coronary left ventricular branch.  Inferobasal hypokinesis. EF 50%. EDP is normal.   York Spaniel he has been feeling well until last night. Restless all night. This am had several episodes of chest pressure relieved by NTG. Says felt just like MI. 1 episode of nausea. Now feels ok.  Ambulation limited by back pain and leg pain. ECG normal. Trop 0.0    Review of Systems:     Cardiac Review of Systems: {Y] = yes [ ]  = no  Chest Pain [  y  ]  Resting SOB [   ] Exertional SOB  [  y]  Orthopnea [  ]   Pedal Edema [   ]    Palpitations [  ] Syncope  [  ]   Presyncope [   ]  General Review of Systems: [Y] = yes [  ]=no Constitional: recent weight change [  ]; anorexia [  ]; fatigue [ y ]; nausea [  ]; night sweats [  ]; fever [  ]; or chills [  ];        Dental: poor dentition[  ];   Eye : blurred vision [  ]; diplopia [   ]; vision changes [  ];  Amaurosis fugax[  ]; Resp: cough [  ];   wheezing[  ];  hemoptysis[  ]; shortness of breath[  ]; paroxysmal nocturnal dyspnea[  ]; dyspnea on exertion[ y ]; or orthopnea[  ];  GI:  gallstones[  ], vomiting[  ];  dysphagia[  ]; melena[  ];  hematochezia [  ]; heartburn[  ];   GU: kidney stones [  ]; hematuria[  ];   dysuria [  ];  nocturia[  ];               Skin: rash [  ], swelling[  ];, hair loss[  ];  peripheral edema[  ];  or itching[  ]; Musculosketetal: myalgias[  ];  joint swelling[  ];  joint erythema[  ];  joint pain[y  ];  back pain[y  ];  Heme/Lymph: bruising[  ];  bleeding[  ];  anemia[  ];  Neuro: TIA[  ];  headaches[  ];  stroke[  ];  vertigo[  ];  seizures[  ];   paresthesias[  ];  difficulty walking[ y ];  Psych:depression[y  ]; anxiety[  y];  Endocrine: diabetes[ y ];  thyroid dysfunction[  ];  Other:  Past Medical History:  Diagnosis Date  . Back injury 02/2002   worker's comp  . Coronary artery disease, non-occlusive    a. cath 2002 with no sig CAD;  b. cath 2008 normal LM, LAD, LCx, p&dRCA 20-30%, PDA 30%; c.11/2015 NSTEMI/PCI: LM nl, LAD 95p (2.5x15 Xience DES), LCX nl, RCA 100p/m w/ L->R collats, EF 55-65% c. NSTEMI (02/2016) with no culprit leision, switched to Brilinta.  d. NSTEMI 03/2016: again, no culprit lesion and switched back to plavix 2/2 SOB with Brilnta.    . Depression   . Diabetes mellitus type 2, insulin dependent (HCC)   . Hyperlipemia   . Hypertensive heart disease   . Kidney stones   . Morbid obesity (HCC)   . Osteoarthritis   . Snoring     Prior to Admission medications   Medication Sig Start Date End Date Taking? Authorizing Provider  amLODipine (NORVASC) 5 MG tablet Take 1 tablet (5 mg total) by mouth daily. 01/12/16  Yes Ok Anis, NP  aspirin 81 MG EC tablet Take 1 tablet (81 mg total) by mouth daily. 01/12/16  Yes Ok Anis, NP  carvedilol (COREG) 6.25 MG tablet Take 1 tablet (6.25 mg total) by mouth daily. 03/24/16  Yes Amy Michelle Nasuti, MD  clopidogrel (PLAVIX) 75 MG  tablet Take 1 tablet (75 mg total) by mouth daily with breakfast. 04/07/16  Yes Janetta Hora, PA-C  ezetimibe (ZETIA) 10 MG tablet Take 1 tablet (10 mg total) by mouth daily. 03/24/16  Yes Amy Michelle Nasuti, MD  furosemide (LASIX) 20 MG tablet Take 1 tablet (20 mg total) by mouth daily. 03/11/16  Yes Katharina Caper, MD  insulin NPH-regular Human (NOVOLIN 70/30 RELION) (70-30) 100 UNIT/ML injection Inject 80 units every morning and 70 units every evening Patient taking differently: Inject 60-70 Units into the skin 2 (two) times daily. Inject 70 units every morning and 60 units every evening 03/28/16  Yes Amy E Bedsole, MD  isosorbide mononitrate (IMDUR) 30 MG 24 hr tablet Take 1 tablet (30 mg total) by mouth daily. 03/11/16  Yes Katharina Caper, MD  nitroGLYCERIN (NITROSTAT) 0.4 MG SL tablet Place 1 tablet (0.4 mg total) under the tongue every 5 (five) minutes as needed for chest pain. 12/26/15  Yes Enedina Finner, MD  potassium chloride SA (K-DUR,KLOR-CON) 20 MEQ tablet Take 1 tablet (20 mEq total) by mouth daily. 03/11/16  Yes Katharina Caper, MD  pravastatin (PRAVACHOL) 40 MG tablet Take 40 mg by mouth daily.   Yes Historical Provider, MD  triamterene-hydrochlorothiazide (MAXZIDE-25) 37.5-25 MG tablet Take 1 tablet by mouth daily.   Yes Historical Provider, MD  rosuvastatin (CRESTOR) 40 MG tablet Take 1 tablet (40 mg total) by mouth daily. Patient not taking: Reported on 05/01/2016 04/18/16 04/18/17  Antonieta Iba, MD      Allergies  Allergen Reactions  . Atorvastatin Other (See Comments)    REACTION: Body aches    Social History   Social History  . Marital status: Married    Spouse name: N/A  . Number of children: N/A  . Years of education: N/A   Occupational History  . disabled Unemployed    back injury   Social History Main Topics  . Smoking status: Never Smoker  . Smokeless tobacco: Never Used  . Alcohol use No  . Drug use: No  . Sexual activity: Not on file   Other Topics Concern  .  Not on file   Social History Narrative   Financial concerns, wife with depression.  Minimal exercise.   Diet: poor.    Family History  Problem Relation Age of Onset  . Alzheimer's disease Mother   . Emphysema Mother   . Diabetes Father   . Heart disease Father     MI  . Cancer Brother     ? Neck cancer    PHYSICAL EXAM: Vitals:   05/01/16 1417  BP: 131/95  Pulse: 74  Temp: 98.2 F (36.8 C)   General:  Obese male sitting in bed NAD HEENT: normal Neck: supple.no obvious JVD . Carotids 2+ bilat; no bruits. No lymphadenopathy or thryomegaly appreciated. Cor: PMI nonpalpable. Regular rate & rhythm. No rubs, gallops or murmurs. Lungs: clear Abdomen: obese soft, nontender, nondistended.  No bruits or masses. Good bowel sounds. Extremities: no cyanosis, clubbing, rash, edema Neuro: alert & oriented x 3, cranial nerves grossly intact. moves all 4 extremities w/o difficulty. Affect pleasant.  ECG: NSR 75 1AVB No ST-T wave abnormalities.    Results for orders placed or performed during the hospital encounter of 05/01/16 (from the past 24 hour(s))  Basic metabolic panel     Status: Abnormal   Collection Time: 05/01/16  2:16 PM  Result Value Ref Range   Sodium 139 135 - 145 mmol/L   Potassium 4.7 3.5 - 5.1 mmol/L   Chloride 107 101 - 111 mmol/L   CO2 27 22 - 32 mmol/L   Glucose, Bld 279 (H) 65 - 99 mg/dL   BUN 25 (H) 6 - 20 mg/dL   Creatinine, Ser 1.61 0.61 - 1.24 mg/dL   Calcium 9.2 8.9 - 09.6 mg/dL   GFR calc non Af Amer >60 >60 mL/min   GFR calc Af Amer >60 >60 mL/min   Anion gap 5 5 - 15  CBC     Status: Abnormal   Collection Time: 05/01/16  2:16 PM  Result Value Ref Range   WBC 6.8 4.0 - 10.5 K/uL   RBC 3.98 (L) 4.22 - 5.81 MIL/uL   Hemoglobin 11.8 (L) 13.0 - 17.0 g/dL   HCT 04.5 (L) 40.9 - 81.1 %   MCV 92.2 78.0 - 100.0 fL   MCH 29.6 26.0 - 34.0 pg   MCHC 32.2 30.0 - 36.0 g/dL   RDW 91.4 78.2 - 95.6 %   Platelets 189 150 - 400 K/uL  I-stat troponin, ED      Status: None   Collection Time: 05/01/16  2:27 PM  Result Value Ref Range   Troponin i, poc 0.00 0.00 - 0.08 ng/mL   Comment 3           Dg Chest 2 View  Result Date: 05/01/2016 CLINICAL DATA:  65 year old male with a history of chest pain EXAM: CHEST  2 VIEW COMPARISON:  04/05/2016 FINDINGS: Cardiomediastinal silhouette unchanged. Similar appearance of low lung volumes with crowded interstitial markings and fullness of the central vasculature. No pneumothorax or pleural effusion. No confluent airspace disease. Coarsened interstitial markings. No displaced fracture. IMPRESSION: Low lung volumes and chronic lung changes without evidence of superimposed acute cardiopulmonary disease. Signed, Yvone Neu. Loreta Ave, DO [facility] Electronically Signed   By: [name] D.O.   On: 05/01/2016 14:48     ASSESSMENT: 1. Recurrent CP/USA 2. CAD      --s/p PCI stent to LAD 5/17. With CTO RCA     --relook cath 04/05/16 with stable CAD 3. Obesity 4. DM2 5. Exertional leg pain - probable claudication  6. OSA - refuses CPAP  PLAN/DISCUSSION:  CP concerning for [place] but currently no objective evidence of ischemia. Recent cath films reviewed. Will bring in for observation to cycle troponin. Continue ASA, Plavix and statin. Start heparin. Treat HTN and cover DM2 with SSI.   Will review films with Dr. [place] or Eldridge DaceVaranasi to see if he would be candidate for CTO off RCA. (+/1 flow wire of LCX)  If rules out wants to go home tomorrow for eye doctor appt on Tuesday and come back for CTO eval.   Will also need eval for PAD if he has not alread had.   Jenner Rosier,MD 5:22 PM

## 2016-05-03 DIAGNOSIS — E113312 Type 2 diabetes mellitus with moderate nonproliferative diabetic retinopathy with macular edema, left eye: Secondary | ICD-10-CM | POA: Diagnosis not present

## 2016-05-27 ENCOUNTER — Other Ambulatory Visit: Payer: PPO

## 2016-05-27 ENCOUNTER — Other Ambulatory Visit: Payer: Self-pay | Admitting: Family Medicine

## 2016-05-27 ENCOUNTER — Telehealth: Payer: Self-pay | Admitting: Family Medicine

## 2016-05-27 DIAGNOSIS — Z794 Long term (current) use of insulin: Principal | ICD-10-CM

## 2016-05-27 DIAGNOSIS — E119 Type 2 diabetes mellitus without complications: Secondary | ICD-10-CM

## 2016-05-27 NOTE — Telephone Encounter (Signed)
Last office visit 03/24/2016.  Not on current medication list.  Refill?

## 2016-05-27 NOTE — Telephone Encounter (Signed)
-----   Message from Alvina Chouerri J Walsh sent at 05/19/2016  5:00 PM EDT ----- Regarding: Lab orders for Friday, 9.29.17 Lab orders for a 3 month follow up appt.

## 2016-05-27 NOTE — Telephone Encounter (Signed)
[name] notified as instructed by telephone. 

## 2016-05-27 NOTE — Telephone Encounter (Signed)
Notify pt .Marland Kitchen. Can use diclofenac for pain  intermittantly but can increase risk of heart attack in long run so limit use or use tylenol instead for pain

## 2016-06-03 ENCOUNTER — Ambulatory Visit: Payer: PPO | Admitting: Family Medicine

## 2016-07-03 ENCOUNTER — Emergency Department (HOSPITAL_COMMUNITY): Payer: PPO

## 2016-07-03 ENCOUNTER — Encounter (HOSPITAL_COMMUNITY): Payer: Self-pay | Admitting: Emergency Medicine

## 2016-07-03 ENCOUNTER — Emergency Department (HOSPITAL_COMMUNITY)
Admission: EM | Admit: 2016-07-03 | Discharge: 2016-07-04 | Disposition: A | Discharge status: Home / Self Care | Payer: PPO | Attending: Emergency Medicine | Admitting: Emergency Medicine

## 2016-07-03 DIAGNOSIS — R079 Chest pain, unspecified: Secondary | ICD-10-CM

## 2016-07-03 DIAGNOSIS — I252 Old myocardial infarction: Secondary | ICD-10-CM | POA: Insufficient documentation

## 2016-07-03 DIAGNOSIS — Z794 Long term (current) use of insulin: Secondary | ICD-10-CM | POA: Insufficient documentation

## 2016-07-03 DIAGNOSIS — Z79899 Other long term (current) drug therapy: Secondary | ICD-10-CM | POA: Diagnosis not present

## 2016-07-03 DIAGNOSIS — R0789 Other chest pain: Secondary | ICD-10-CM | POA: Insufficient documentation

## 2016-07-03 DIAGNOSIS — I2510 Atherosclerotic heart disease of native coronary artery without angina pectoris: Secondary | ICD-10-CM | POA: Diagnosis not present

## 2016-07-03 DIAGNOSIS — I10 Essential (primary) hypertension: Secondary | ICD-10-CM | POA: Insufficient documentation

## 2016-07-03 DIAGNOSIS — Z7982 Long term (current) use of aspirin: Secondary | ICD-10-CM | POA: Diagnosis not present

## 2016-07-03 DIAGNOSIS — E119 Type 2 diabetes mellitus without complications: Secondary | ICD-10-CM | POA: Insufficient documentation

## 2016-07-03 DIAGNOSIS — R0602 Shortness of breath: Secondary | ICD-10-CM | POA: Diagnosis not present

## 2016-07-03 LAB — CBC
HCT: 36.4 % — ABNORMAL LOW (ref 39.0–52.0)
Hemoglobin: 11.9 g/dL — ABNORMAL LOW (ref 13.0–17.0)
MCH: 28.8 pg (ref 26.0–34.0)
MCHC: 32.7 g/dL (ref 30.0–36.0)
MCV: 88.1 fL (ref 78.0–100.0)
Platelets: 205 10*3/uL (ref 150–400)
RBC: 4.13 MIL/uL — ABNORMAL LOW (ref 4.22–5.81)
RDW: 14 % (ref 11.5–15.5)
WBC: 6 10*3/uL (ref 4.0–10.5)

## 2016-07-03 LAB — I-STAT TROPONIN, ED
Troponin i, poc: 0 ng/mL (ref 0.00–0.08)
Troponin i, poc: 0.02 ng/mL (ref 0.00–0.08)

## 2016-07-03 LAB — BASIC METABOLIC PANEL
Anion gap: 9 (ref 5–15)
BUN: 13 mg/dL (ref 6–20)
CO2: 27 mmol/L (ref 22–32)
Calcium: 8.9 mg/dL (ref 8.9–10.3)
Chloride: 101 mmol/L (ref 101–111)
Creatinine, Ser: 1.09 mg/dL (ref 0.61–1.24)
GFR calc Af Amer: >60 mL/min (ref >60)
GFR calc non Af Amer: >60 mL/min (ref >60)
Glucose, Bld: 459 mg/dL — ABNORMAL HIGH (ref 65–99)
Potassium: 3.9 mmol/L (ref 3.5–5.1)
Sodium: 137 mmol/L (ref 135–145)

## 2016-07-03 LAB — PROTIME-INR
INR: 1.01
Prothrombin Time: 13.3 seconds (ref 11.4–15.2)

## 2016-07-03 MED ORDER — ALBUTEROL (5 MG/ML) CONTINUOUS INHALATION SOLN
INHALATION_SOLUTION | RESPIRATORY_TRACT | Status: AC
  Filled 2016-07-03: qty 20

## 2016-07-03 MED ORDER — ASPIRIN 81 MG PO CHEW
324.0000 mg | CHEWABLE_TABLET | Freq: Once | ORAL | Status: AC
  Administered 2016-07-03: 324 mg via ORAL
  Filled 2016-07-03: qty 4

## 2016-07-03 NOTE — ED Notes (Signed)
Patient transported to X-ray via stretcher 

## 2016-07-03 NOTE — ED Triage Notes (Signed)
[facility] with complaints of chest pain. The patient was walking down his driveway when he experienced severe chest pain that radiated to his low back. During transit the pt was hypertensive with systolic in the 230s and glucose of 400. The patient does not complain of any chest pain upon arrival to the ED. Vitals are stable.

## 2016-07-03 NOTE — ED Provider Notes (Signed)
[facility]-EMERGENCY DEPT Provider Note   CSN: [id number] Arrival date & time: 07/03/16  1943     History   Chief Complaint Chief Complaint  Patient presents with  . Chest Pain    HPI [name] is a 65 y.o. male.  HPI   Severe chest pain began 6PM, after had walked trash down driveway short walk from truck to gate.  5 minutes severe pain, worse pain than had with prior MI.  Tried chewing gum in case it was indigestion.  Similar pain to prior MI but more severe.  Severe tightness middle of chest and went to back, no radiation to arm or neck.  Felt lightheaded, short of breath, no nausea.  Took nitro at home and that helped, no pain since took the nitro. Trash was not heavy. Went away initially after resting, then it started to come back and took nitro and it got better.   Glucose elevated with EMS. Htn 230 with EMS, reports normally well controlled.   Past Medical History:  Diagnosis Date  . Back injury 02/2002   worker's comp  . Coronary artery disease, non-occlusive    a. cath 2002 with no sig CAD;  b. cath 2008 normal LM, LAD, LCx, p&dRCA 20-30%, PDA 30%; c.11/2015 NSTEMI/PCI: LM nl, LAD 95p (2.5x15 Xience DES), LCX nl, RCA 100p/m w/ L->R collats, EF 55-65% c. NSTEMI (02/2016) with no culprit leision, switched to Brilinta.  d. NSTEMI 03/2016: again, no culprit lesion and switched back to plavix 2/2 SOB with Brilnta.    . Depression   . Diabetes mellitus type 2, insulin dependent (HCC)   . Hyperlipemia   . Hypertensive heart disease   . Kidney stones   . Morbid obesity (HCC)   . Osteoarthritis   . Snoring     Patient Active Problem List   Diagnosis Date Noted  . Coronary artery disease, non-occlusive   . Hyperlipemia   . Elevated left ventricular end-diastolic pressure (LVEDP) 03/11/2016  . Leg swelling 03/11/2016  . Anemia 03/11/2016  . Essential hypertension   . Statin intolerance   . Hypertensive heart disease   . Diabetes mellitus type 2, insulin dependent (HCC)     . Coronary artery disease involving native coronary artery of native heart with unstable angina pectoris (HCC)   . Morbid obesity (HCC)   . NSTEMI (non-ST elevated myocardial infarction) (HCC)   . Uncontrolled type 2 diabetes mellitus with circulatory disorder (HCC)   . Advanced care planning/counseling discussion 11/20/2015  . Noncompliance with diabetes treatment 11/05/2015  . Noncompliance with diet and medication regimen 11/05/2015  . Right medial knee pain 09/23/2014  . Diabetic retinopathy (HCC) 09/23/2014  . Snoring 12/21/2012  . HYPOGONADISM 09/28/2010  . VITAMIN B12 DEFICIENCY 09/28/2010  . UNSPECIFIED VITAMIN D DEFICIENCY 09/28/2010  . OTHER MALAISE AND FATIGUE 09/17/2010  . TRIGGER FINGER, RIGHT MIDDLE 09/14/2010  . BRANCH RETINAL VEIN OCCLUSION 11/26/2009  . LEG PAIN, BILATERAL 10/06/2009  . CONSTIPATION 08/10/2009  . GASTRITIS 07/29/2009  . Major depressive disorder, recurrent episode, moderate (HCC) 07/06/2007  . ERECTILE DYSFUNCTION 04/10/2007  . Uncontrolled diabetes mellitus with retinopathy (HCC) 03/26/2007  . OBESITY 03/26/2007  . RENAL CALCULUS, HX OF 03/26/2007  . Hyperlipidemia LDL goal <70 12/05/2006  . Essential hypertension, benign 12/05/2006  . OSTEOARTHRITIS 12/05/2006    Past Surgical History:  Procedure Laterality Date  . CARDIAC CATHETERIZATION  09/2000   diffuse LAD 30% LCA  EF 50-60%  . CARDIAC CATHETERIZATION  06/2007   no significant CAD  .  CARDIAC CATHETERIZATION N/A 12/25/2015   Procedure: Left Heart Cath and Coronary Angiography;  Surgeon: Antonieta Ibaimothy J Gollan, MD;  Location: [facility] INVASIVE CV LAB;  Service: Cardiovascular;  Laterality: N/A;  . CARDIAC CATHETERIZATION N/A 12/25/2015   Procedure: Coronary Stent Intervention;  Surgeon: Alwyn Peawayne D Callwood, MD;  Location: [facility] INVASIVE CV LAB;  Service: Cardiovascular;  Laterality: N/A;  . CARDIAC CATHETERIZATION N/A 03/10/2016   Procedure: Left Heart Cath and Coronary Angiography;  Surgeon: Iran OuchMuhammad A  Arida, MD;  Location: [facility] INVASIVE CV LAB;  Service: Cardiovascular;  Laterality: N/A;  . CARDIAC CATHETERIZATION N/A 04/06/2016   Procedure: Left Heart Cath and Coronary Angiography;  Surgeon: Lyn RecordsHenry W Smith, MD;  [facility];  Service: Cardiovascular;  Laterality: N/A;  . CIRCUMCISION         Home Medications    Prior to Admission medications   Medication Sig Start Date End Date Taking? Authorizing Provider  aspirin 81 MG EC tablet Take 1 tablet (81 mg total) by mouth daily. 01/12/16  Yes Ok Anishristopher R [name], NP  insulin NPH-regular Human (NOVOLIN 70/30 RELION) (70-30) 100 UNIT/ML injection Inject 80 units every morning and 70 units every evening Patient taking differently: Inject 60-80 Units into the skin 2 (two) times daily. Inject 80 units subcutaneously before breakfast and 60 units before supper 03/28/16  Yes Amy E Bedsole, MD  nitroGLYCERIN (NITROSTAT) 0.4 MG SL tablet Place 1 tablet (0.4 mg total) under the tongue every 5 (five) minutes as needed for chest pain. 12/26/15  Yes Enedina FinnerSona Patel, MD  PRESCRIPTION MEDICATION Eye drops - administer day before and day after eye injections or laser treatments - next office visit mid December 2017   Yes Historical Provider, MD  amLODipine (NORVASC) 5 MG tablet Take 1 tablet (5 mg total) by mouth daily. 01/12/16   Ok Anishristopher R [name], NP  carvedilol (COREG) 6.25 MG tablet Take 1 tablet (6.25 mg total) by mouth daily. 03/24/16   Excell SeltzerAmy E Bedsole, MD  clopidogrel (PLAVIX) 75 MG tablet Take 1 tablet (75 mg total) by mouth daily with breakfast. 04/07/16   Janetta HoraKathryn R Thompson, PA-C  diclofenac (VOLTAREN) 75 MG EC tablet TAKE ONE TABLET BY MOUTH TWICE DAILY 05/27/16   Amy Michelle NasutiE Bedsole, MD  ezetimibe (ZETIA) 10 MG tablet Take 1 tablet (10 mg total) by mouth daily. 03/24/16   Amy Michelle NasutiE Bedsole, MD  furosemide (LASIX) 20 MG tablet Take 1 tablet (20 mg total) by mouth daily. 03/11/16   Katharina Caperima Vaickute, MD  isosorbide mononitrate (IMDUR) 30 MG 24 hr tablet Take 1  tablet (30 mg total) by mouth daily. 03/11/16   Katharina Caperima Vaickute, MD  potassium chloride SA (K-DUR,KLOR-CON) 20 MEQ tablet Take 1 tablet (20 mEq total) by mouth daily. 03/11/16   Katharina Caperima Vaickute, MD  pravastatin (PRAVACHOL) 40 MG tablet Take 40 mg by mouth daily.    Historical Provider, MD  rosuvastatin (CRESTOR) 40 MG tablet Take 1 tablet (40 mg total) by mouth daily. Patient not taking: Reported on 05/01/2016 04/18/16 04/18/17  Antonieta Ibaimothy J Gollan, MD  triamterene-hydrochlorothiazide (MAXZIDE-25) 37.5-25 MG tablet Take 1 tablet by mouth daily.    Historical Provider, MD    Family History Family History  Problem Relation Age of Onset  . Alzheimer's disease Mother   . Emphysema Mother   . Diabetes Father   . Heart disease Father     MI  . Cancer Brother     ? Neck cancer    Social History Social History  Substance Use Topics  .  Smoking status: Never Smoker  . Smokeless tobacco: Never Used  . Alcohol use No     Allergies   Atorvastatin   Review of Systems Review of Systems  Constitutional: Negative for fever.  HENT: Negative for congestion and sore throat.   Eyes: Negative for visual disturbance.  Respiratory: Positive for shortness of breath. Negative for cough.   Cardiovascular: Positive for chest pain. Negative for leg swelling.  Gastrointestinal: Positive for nausea. Negative for abdominal pain and vomiting.  Genitourinary: Negative for difficulty urinating.  Musculoskeletal: Positive for back pain. Negative for neck pain.  Neurological: Positive for light-headedness and headaches (mild after nitro). Negative for weakness and numbness.     Physical Exam Updated Vital Signs BP (!) 139/120   Pulse 73   Temp 97.9 F (36.6 C) (Oral)   Resp 24   Ht 5\' 6"  (1.676 m)   Wt 297 lb (134.7 kg)   SpO2 97%   BMI 47.94 kg/m   Physical Exam  Constitutional: He is oriented to person, place, and time. He appears well-developed and well-nourished. No distress.  HENT:  Head:  Normocephalic and atraumatic.  Eyes: Conjunctivae and EOM are normal.  Neck: Normal range of motion.  Cardiovascular: Normal rate, regular rhythm, normal heart sounds and intact distal pulses.  Exam reveals no gallop and no friction rub.   No murmur heard. Pulmonary/Chest: Effort normal and breath sounds normal. No respiratory distress. He has no wheezes. He has no rales. He exhibits tenderness (reports chronic chest wall tenderness).  Abdominal: Soft. He exhibits no distension. There is no tenderness. There is no guarding.  Musculoskeletal: He exhibits edema (trace bilateral).  Neurological: He is alert and oriented to person, place, and time.  Skin: Skin is warm and dry. He is not diaphoretic.  Nursing note and vitals reviewed.    ED Treatments / Results  Labs (all labs ordered are listed, but only abnormal results are displayed) Labs Reviewed  BASIC METABOLIC PANEL - Abnormal; Notable for the following:       Result Value   Glucose, Bld 459 (*)    All other components within normal limits  CBC - Abnormal; Notable for the following:    RBC 4.13 (*)    Hemoglobin 11.9 (*)    HCT 36.4 (*)    All other components within normal limits  PROTIME-INR  I-STAT TROPOININ, ED  I-STAT TROPOININ, ED    EKG  EKG Interpretation  Date/Time:  Sunday July 03 2016 19:48:01 EST Ventricular Rate:  75 PR Interval:    QRS Duration: 100 QT Interval:  389 QTC Calculation: 435 R Axis:   -25 Text Interpretation:  Sinus rhythm Borderline prolonged PR interval Borderline left axis deviation No significant change since last tracing Confirmed by [facility] MD, Keighan Amezcua ([id number]) on 07/03/2016 7:59:34 PM       Radiology Dg Chest 2 View  Result Date: 07/03/2016 CLINICAL DATA:  Acute onset chest pain and shortness of breath today. Previous myocardial infarcts. EXAM: CHEST  2 VIEW COMPARISON:  05/01/2016 FINDINGS: The heart size and mediastinal contours are within normal limits. Both lungs are clear.  The visualized skeletal structures are unremarkable. IMPRESSION: No active cardiopulmonary disease. Electronically Signed   By: Myles Rosenthal M.D.   On: 07/03/2016 21:43    Procedures Procedures (including critical care time)  Medications Ordered in ED Medications  aspirin chewable tablet 324 mg (324 mg Oral Given 07/03/16 2043)     Initial Impression / Assessment and Plan / ED Course  I have reviewed the triage vital signs and the nursing notes.  Pertinent labs & imaging results that were available during my care of the patient were reviewed by me and considered in my medical decision making (see chart for details).  Clinical Course     65yo male with history of htn, DM, CAD with NSTEMI 11/2015 with PCI, again 02/2016 with cath showing no culprit, likely demand ischemia, presents with concern for chest pain.  Bilateral upper and lower extremity pulses equal, no mediastinal widening, low suspicion for dissection. No tachypnea, no hypoxia, no asymmetric leg swelling, improvement of pain and doubt PE.  Pain resolved with nitroglycerin prior to arrival.  Troponin x2 negative and doubt ACS.  Discussed recommendation for observation given hx of CAD, however patient reports he needs to return home to care for his wife and understands risks. Glucose elevated however no evidence of DKA. Recommend close Cardiology and PCP follow up and return to ED for new or repeat symptoms.   Final Clinical Impressions(s) / ED Diagnoses   Final diagnoses:  Chest pain, unspecified type    New Prescriptions Discharge Medication List as of 07/03/2016 11:56 PM       Alvira Monday, MD 07/04/16 1059

## 2016-07-04 ENCOUNTER — Telehealth: Payer: Self-pay | Admitting: Cardiovascular Disease

## 2016-07-04 NOTE — Telephone Encounter (Signed)
[facility] er [facility] for [facility].  Per spouse they were going to keep him overnight but he wouldn't stay.  Patient calling now with wife stating he feels drained denies cp but says chest is sore.  Patient is seeing Gollan tomorrow at 3

## 2016-07-05 ENCOUNTER — Encounter: Payer: Self-pay | Admitting: Cardiovascular Disease

## 2016-07-05 ENCOUNTER — Ambulatory Visit (INDEPENDENT_AMBULATORY_CARE_PROVIDER_SITE_OTHER): Payer: PPO | Admitting: Cardiovascular Disease

## 2016-07-05 VITALS — BP 110/60 | HR 78 | Ht 67.0 in | Wt 296.5 lb

## 2016-07-05 DIAGNOSIS — I25110 Atherosclerotic heart disease of native coronary artery with unstable angina pectoris: Secondary | ICD-10-CM | POA: Diagnosis not present

## 2016-07-05 DIAGNOSIS — E785 Hyperlipidemia, unspecified: Secondary | ICD-10-CM

## 2016-07-05 DIAGNOSIS — Z23 Encounter for immunization: Secondary | ICD-10-CM

## 2016-07-05 DIAGNOSIS — I10 Essential (primary) hypertension: Secondary | ICD-10-CM | POA: Diagnosis not present

## 2016-07-05 DIAGNOSIS — G8929 Other chronic pain: Secondary | ICD-10-CM

## 2016-07-05 DIAGNOSIS — Z794 Long term (current) use of insulin: Secondary | ICD-10-CM

## 2016-07-05 DIAGNOSIS — E1159 Type 2 diabetes mellitus with other circulatory complications: Secondary | ICD-10-CM | POA: Diagnosis not present

## 2016-07-05 DIAGNOSIS — IMO0002 Reserved for concepts with insufficient information to code with codable children: Secondary | ICD-10-CM

## 2016-07-05 DIAGNOSIS — R079 Chest pain, unspecified: Secondary | ICD-10-CM

## 2016-07-05 DIAGNOSIS — E1165 Type 2 diabetes mellitus with hyperglycemia: Secondary | ICD-10-CM

## 2016-07-05 MED ORDER — ISOSORBIDE MONONITRATE ER 30 MG PO TB24: 30.0000 mg | ORAL_TABLET | Freq: Two times a day (BID) | ORAL | 3 refills | Status: DC

## 2016-07-05 MED ORDER — NITROGLYCERIN 0.4 MG SL SUBL: 0.4000 mg | SUBLINGUAL_TABLET | SUBLINGUAL | 6 refills | Status: DC | PRN

## 2016-07-05 NOTE — Progress Notes (Signed)
Cardiology Office Note  Date:  07/05/2016   ID:  [name], DOB Aug 19, 1951, MRN [id number]  PCP:  Kerby NoraAmy Bedsole, MD   Chief Complaint  Patient presents with  . other    Follow [facility]. Meds reviewed by the pt. verbally. Pt. c/o shortness of breath with over exertion and fatigue.     HPI:  William FareJohn H Atkinsis a 65 y.o.malewith a history ofmorbid obesity, HTN, DM, CAD manifested as NSTEMI in 11/2015 (PCI to LAD; was discharged on Plavix) and again 02/2016 (no PCI; was discharged on Brilinta), Again [facility] on 04/05/16 for NSTEMI (no PCI),  Who presents for follow-up of his coronary artery disease  Since his last clinic visit, he has been back to the emergency room twice 05/01/2016 was getting ready to go to the church, developed pain in his mediastinal area Symptoms relieved with nitroglycerin, ruled out in the ER, sent home  Reports his sugars have been running high  07/03/2016 reports that even relatively active, mowing all day on zero turn mower, Then lifted a heavy toilet for the church Cutback into his truck, developed acute onset chest pain Took nitroglycerin, aspirin Went to the ER 07/03/16 Symptoms resolved on the way EKG unchanged, "was not like a heart attack" BP went up in the setting of pain  Reports having some pain in the exam room today when he pulls his shoulders back, feels tight in his mediastinal area  Continues to have chronic fatigue Mild snoring, not bad Will sleep for 3 hours, then wakes up, sits in the living room for 40 minutes then goes back to bed  Takes care of his wife, she is in a wheelchair, works on a chicken farm, unable to get going.  Has side effect on Lasix, causes blurry vision Now taking Lasix as needed, denies any excessive leg swelling  Previously had problems on brilinta, this caused shortness of breath Most recent catheterization 04/06/2016, stable LAD stent, occluded RCA with collaterals  EKG on today's visit  shows normal sinus rhythm with rate 78 bpm, first-degree AV block, old inferior MI  Other past medical history reviewed Cath 04/06/16 EF 50%  Chronic total occlusion of the right coronary in the proximal segment well collateralized from the left circumflex and LAD.  Widely patent proximal to mid LAD stent previously placed in July. There is first diagonal diagonal and LAD 30 and 50% narrowing respectively.  Widely patent circumflex unchanged from previous with 70% narrowing in a small branch of the first marginal. Circumflex collateralizes the distal right coronary left ventricular branch.  Inferobasal hypokinesis. EF 50%. EDP is normal.   2002 diagnostic catheterization revealing no significant CAD.  catheterization 2008 again showed nonobstructive CAD.   admitted to [facility] in late April 2017 with a two-month history of progressive exertional chest discomfort.  ruled in for non-STEMI and subsequent underwent catheterization revealing a chronic total occlusion of the right coronary artery with severe proximal LAD stenosis. The LAD was felt to be the culprit vessel and this was successfully stented using a drug-eluting stent. He was placed on ASA and plavix.  admitted again in 02/2016 for NSTEMI but no culprit found on cath 03/10/16. Felt to be possibly demand ischemia in the setting of the patient's chronically occluded RCA with underlying hypertensive heart disease and moderately elevated LVEDP. He was switched to Brilinta.   [facility]05/16 with recurrent chest pain.    PMH:   has a past medical  history of Back injury (02/2002); Coronary artery disease, non-occlusive; Depression; Diabetes mellitus type 2, insulin dependent (HCC); Hyperlipemia; Hypertensive heart disease; Kidney stones; Morbid obesity (HCC); Osteoarthritis; and Snoring.  PSH:    Past Surgical History:  Procedure Laterality Date  . CARDIAC CATHETERIZATION  09/2000   diffuse LAD 30% LCA  EF  50-60%  . CARDIAC CATHETERIZATION  06/2007   no significant CAD  . CARDIAC CATHETERIZATION N/A 12/25/2015   Procedure: Left Heart Cath and Coronary Angiography;  Surgeon: Antonieta Iba, MD;  Location: [facility] INVASIVE CV LAB;  Service: Cardiovascular;  Laterality: N/A;  . CARDIAC CATHETERIZATION N/A 12/25/2015   Procedure: Coronary Stent Intervention;  Surgeon: Alwyn Pea, MD;  Location: [facility] INVASIVE CV LAB;  Service: Cardiovascular;  Laterality: N/A;  . CARDIAC CATHETERIZATION N/A 03/10/2016   Procedure: Left Heart Cath and Coronary Angiography;  Surgeon: Iran Ouch, MD;  Location: [facility] INVASIVE CV LAB;  Service: Cardiovascular;  Laterality: N/A;  . CARDIAC CATHETERIZATION N/A 04/06/2016   Procedure: Left Heart Cath and Coronary Angiography;  Surgeon: Lyn Records, MD;  Location: [facility] INVASIVE CV LAB;  Service: Cardiovascular;  Laterality: N/A;  . CIRCUMCISION      Current Outpatient Prescriptions  Medication Sig Dispense Refill  . aspirin 81 MG EC tablet Take 1 tablet (81 mg total) by mouth daily.    . carvedilol (COREG) 6.25 MG tablet Take 1 tablet (6.25 mg total) by mouth daily. 90 tablet 1  . clopidogrel (PLAVIX) 75 MG tablet Take 1 tablet (75 mg total) by mouth daily with breakfast. 90 tablet 3  . ezetimibe (ZETIA) 10 MG tablet Take 1 tablet (10 mg total) by mouth daily. 90 tablet 1  . furosemide (LASIX) 20 MG tablet Take 1 tablet (20 mg total) by mouth daily. 30 tablet 5  . insulin NPH-regular Human (NOVOLIN 70/30 RELION) (70-30) 100 UNIT/ML injection Inject 80 units every morning and 70 units every evening (Patient taking differently: Inject 60-80 Units into the skin 2 (two) times daily. Inject 80 units subcutaneously before breakfast and 60 units before supper) 4 vial 11  . isosorbide mononitrate (IMDUR) 30 MG 24 hr tablet Take 1 tablet (30 mg total) by mouth 2 (two) times daily. 180 tablet 3  . nitroGLYCERIN (NITROSTAT) 0.4 MG SL tablet Place 1 tablet (0.4 mg total) under the  tongue every 5 (five) minutes as needed for chest pain. 25 tablet 6  . potassium chloride SA (K-DUR,KLOR-CON) 20 MEQ tablet Take 1 tablet (20 mEq total) by mouth daily. 30 tablet 5  . pravastatin (PRAVACHOL) 40 MG tablet Take 40 mg by mouth daily.    Marland Kitchen PRESCRIPTION MEDICATION Eye drops - administer day before and day after eye injections or laser treatments - next office visit mid December 2017     No current facility-administered medications for this visit.      Allergies:   Atorvastatin   Social History:  The patient  reports that he has never smoked. He has never used smokeless tobacco. He reports that he does not drink alcohol or use drugs.   Family History:   family history includes Alzheimer's disease in his mother; Cancer in his brother; Diabetes in his father; Emphysema in his mother; Heart disease in his father.    Review of Systems: Review of Systems  Constitutional: Positive for malaise/fatigue.  Respiratory: Negative.   Cardiovascular: Positive for chest pain.  Gastrointestinal: Negative.   Musculoskeletal: Negative.   Neurological: Negative.   Psychiatric/Behavioral: Negative.   All other systems  reviewed and are negative.    PHYSICAL EXAM: VS:  BP 110/60 (BP Location: Left Arm, Patient Position: Sitting, Cuff Size: Large)   Pulse 78   Ht 5\' 7"  (1.702 m)   Wt 296 lb 8 oz (134.5 kg)   BMI 46.44 kg/m  , BMI Body mass index is 46.44 kg/m. GEN: Well nourished, well developed, in no acute distress, obese  HEENT: normal  Neck: no JVD, carotid bruits, or masses Cardiac: RRR; no murmurs, rubs, or gallops,no edema  Respiratory:  clear to auscultation bilaterally, normal work of breathing GI: soft, nontender, nondistended, + BS MS: no deformity or atrophy  Skin: warm and dry, no rash Neuro:  Strength and sensation are intact Psych: euthymic mood, full affect    Recent Labs: 03/09/2016: ALT 24 04/06/2016: B Natriuretic Peptide 46.1 07/03/2016: BUN 13; Creatinine,  Ser 1.09; Hemoglobin 11.9; Platelets 205; Potassium 3.9; Sodium 137    Lipid Panel Lab Results  Component Value Date   CHOL 197 02/16/2016   HDL 33.20 (L) 02/16/2016   LDLCALC 139 (H) 02/16/2016   TRIG 122.0 02/16/2016      Wt Readings from Last 3 Encounters:  07/05/16 296 lb 8 oz (134.5 kg)  07/03/16 297 lb (134.7 kg)  04/18/16 298 lb 4 oz (135.3 kg)       ASSESSMENT AND PLAN:  Coronary artery disease involving native coronary artery of native heart with unstable angina pectoris (HCC) - Plan: EKG 12-Lead Several cardiac catheterizations this year Details as above, trips to the emergency room, likely atypical chest pain though unable to exclude stable angina No further testing at this time  Encounter for immunization - Plan: Flu Vaccine QUAD 36+ mos IM Flu shot provided  Hyperlipidemia LDL goal <70 Unable to tolerate Crestor, had myalgias He is back on pravastatin with Zetia  Uncontrolled type 2 diabetes mellitus with other circulatory complication, with long-term current use of insulin (HCC) Reports having strict diet, does not eat breads, potatoes Tries not to eat carbs If he does not eat after 5:00, sugar drops low in the morning, has to eat a snack in the evenings But does report sugars are chronically elevated more than 200  Essential hypertension Recommended he hold the triamterene HCTZ We will increase isosorbide up to 30 mg twice a day for possible anginal symptoms Recommended he take Lasix with potassium as needed for any ankle swelling  Chronic chest pain Several trips to the emergency room Most recent episodes concerning for musculoskeletal disorder Seem to happen after heavy exertion Recommended he push on his chest when he has symptoms, if soreness, likely musculoskeletal. He does have a tendency to overdo it We did refill his nitroglycerin   Total encounter time more than 25 minutes  Greater than 50% was spent in counseling and coordination of care  with the patient   Disposition:   F/U  6 months   Orders Placed This Encounter  Procedures  . Flu Vaccine QUAD 36+ mos IM  . EKG 12-Lead     Signed, [name], M.D., Ph.D. 07/05/2016  [facility], [place] [phone]

## 2016-07-05 NOTE — Patient Instructions (Addendum)
Medication Instructions:   Please hold the triamterene HCTZ  Increase the isosorbide up to twice a day (long acting nitro)  Labwork:  No new labs needed  Testing/Procedures:  No further testing at this time   Follow-Up: It was a pleasure seeing you in the office today. Please call us if you have new issues that need to be addressed before your next appt.  [phone]  Your physician wants you to follow-up in: 6 months.  You will receive a reminder letter in the mail two months in advance. If you don't receive a letter, please call our office to schedule the follow-up appointment.  If you need a refill on your cardiac medications before your next appointment, please call your pharmacy.

## 2016-07-20 ENCOUNTER — Telehealth: Payer: Self-pay | Admitting: Cardiovascular Disease

## 2016-07-20 NOTE — Telephone Encounter (Signed)
Spoke w/ pt.  He reports that he has felt bad since starting Zetia at his last ov on 11/7. Reports that he feels funny, has no energy, chest pain, joint pain, and "just don't care anymore". Reports that he read the potential side effects and has all of them. He did not take this am and does not want to resume.  Advised him to hold and see if sx improve. Asked him to call back after the holidays and let me know how he is doing. He understands to call the office over the long weekend and may leave a message for provider on call if necessary and to proceed to ED or call 911 if sx become emergent.

## 2016-07-20 NOTE — Telephone Encounter (Signed)
Pt wife states Isosorbide is causing him chest pain. States this started when Dr. Mariah MillingGollan increased this medication. Please call.

## 2016-07-29 ENCOUNTER — Other Ambulatory Visit: Payer: Self-pay

## 2016-07-29 MED ORDER — CYCLOBENZAPRINE HCL 10 MG PO TABS: 10.0000 mg | ORAL_TABLET | Freq: Three times a day (TID) | ORAL | 1 refills | Status: DC | PRN

## 2016-07-29 NOTE — Telephone Encounter (Signed)
[facility] left v/m requesting refill flexeril 10 mg. rx last filled by ED physician for back pain. Pt last seen 03/24/16. Last refilled # 20 x 1 on 09/28/15 by Dr Fayette PhoKinner(ED physician). I spoke with Mrs Lauritsen(DPR signed) and the heart doctor told pt that if his chest was sore when pressed on chest  It was due to spasms in his chest and not a heart attack; pt wants to take flexeril for this but Dr Mariah MillingGollan did not tell pt to take the flexeril for chest pain. Pt wanted Dr Daphine DeutscherBedsole's opinion and to refill med. Pt is not having CP now; CP occurs on and off.Please advise.

## 2016-07-29 NOTE — Telephone Encounter (Signed)
Okay to try flexeril if having chest pain spasms but  to contact  US if it is not improving the pain.  call sooner or Go to ER if shortness of breath along with chest pain.

## 2016-07-29 NOTE — Telephone Encounter (Signed)
Mr. Vickey Sagestkins notified as instructed by telephone.

## 2016-08-02 NOTE — Telephone Encounter (Signed)
[facility] called, cyclobenzaprine has not been called in that was approved on 07/29/16.[facility].

## 2016-08-09 ENCOUNTER — Other Ambulatory Visit: Payer: Self-pay | Admitting: Family Medicine

## 2016-08-09 DIAGNOSIS — H34812 Central retinal vein occlusion, left eye, with macular edema: Secondary | ICD-10-CM | POA: Diagnosis not present

## 2016-08-09 DIAGNOSIS — E113312 Type 2 diabetes mellitus with moderate nonproliferative diabetic retinopathy with macular edema, left eye: Secondary | ICD-10-CM | POA: Diagnosis not present

## 2016-08-09 DIAGNOSIS — E113391 Type 2 diabetes mellitus with moderate nonproliferative diabetic retinopathy without macular edema, right eye: Secondary | ICD-10-CM | POA: Diagnosis not present

## 2016-08-09 DIAGNOSIS — H348112 Central retinal vein occlusion, right eye, stable: Secondary | ICD-10-CM | POA: Diagnosis not present

## 2016-08-17 DIAGNOSIS — H34812 Central retinal vein occlusion, left eye, with macular edema: Secondary | ICD-10-CM | POA: Diagnosis not present

## 2016-08-17 DIAGNOSIS — E113312 Type 2 diabetes mellitus with moderate nonproliferative diabetic retinopathy with macular edema, left eye: Secondary | ICD-10-CM | POA: Diagnosis not present

## 2016-08-18 ENCOUNTER — Other Ambulatory Visit: Payer: Self-pay | Admitting: Family Medicine

## 2016-09-07 DIAGNOSIS — H348112 Central retinal vein occlusion, right eye, stable: Secondary | ICD-10-CM | POA: Diagnosis not present

## 2016-09-07 DIAGNOSIS — E113312 Type 2 diabetes mellitus with moderate nonproliferative diabetic retinopathy with macular edema, left eye: Secondary | ICD-10-CM | POA: Diagnosis not present

## 2016-09-07 DIAGNOSIS — E113391 Type 2 diabetes mellitus with moderate nonproliferative diabetic retinopathy without macular edema, right eye: Secondary | ICD-10-CM | POA: Diagnosis not present

## 2016-09-07 DIAGNOSIS — H34812 Central retinal vein occlusion, left eye, with macular edema: Secondary | ICD-10-CM | POA: Diagnosis not present

## 2016-09-09 ENCOUNTER — Encounter: Payer: Self-pay | Admitting: Family Medicine

## 2016-09-09 ENCOUNTER — Ambulatory Visit (INDEPENDENT_AMBULATORY_CARE_PROVIDER_SITE_OTHER): Payer: PPO | Admitting: Family Medicine

## 2016-09-09 DIAGNOSIS — B9789 Other viral agents as the cause of diseases classified elsewhere: Secondary | ICD-10-CM | POA: Diagnosis not present

## 2016-09-09 DIAGNOSIS — J069 Acute upper respiratory infection, unspecified: Secondary | ICD-10-CM | POA: Diagnosis not present

## 2016-09-09 DIAGNOSIS — F4323 Adjustment disorder with mixed anxiety and depressed mood: Secondary | ICD-10-CM

## 2016-09-09 DIAGNOSIS — K219 Gastro-esophageal reflux disease without esophagitis: Secondary | ICD-10-CM | POA: Insufficient documentation

## 2016-09-09 MED ORDER — ALPRAZOLAM 0.25 MG PO TABS: 0.5000 mg | ORAL_TABLET | Freq: Two times a day (BID) | ORAL | 0 refills | Status: DC | PRN

## 2016-09-09 NOTE — Patient Instructions (Addendum)
Call to let me know what OTC med for heartburn you are using.  Avoid triggers for reflux.  Can use alprazolam for  anxiety/mood  and sleep  twice daily as needed.  Call if interested in grief counseling or support group info.  Symptomatic care for viral infeciton.. with mucinex and flonase as needed.  Call if not turning the corner in 3-4 days.

## 2016-09-09 NOTE — Assessment & Plan Note (Signed)
Improved with current OTC med.. Will call to let us know what it is.   trigger avoidance reviewed. Encouraged exercise, weight loss, healthy eating habits.

## 2016-09-09 NOTE — Progress Notes (Signed)
Pre visit review using our clinic review tool, if applicable. No additional management support is needed unless otherwise documented below in the visit note. 

## 2016-09-09 NOTE — Progress Notes (Signed)
   Subjective:    Patient ID: [name], male    DOB: 1950-10-01, 66 y.o.   MRN: [id number]  HPI   66 year old male presents with multiple complaints. 1. His wife passed away 2 weeks a.go and he has been noting severe depression. Cannot sleep at night.  He feels like he needs her to be there. He sits an crys all the time. Children are supportive.  He has trouble when they leave. No SI, no HI    2. Heartburn, chest pain after eating off and on. OTC med for reflux helped some. Occurred after eating bacon yesterday. Acid taste in throat.   3. Cough, sore throat and nasal congestion x 3 days  Using mucinex DM 12 hours.  No fever.  Has noted some SOB and wheeze with exertion.      Review of Systems  Constitutional: Positive for fatigue.  HENT: Negative for ear pain.   Eyes: Negative for pain.  Respiratory: Positive for cough. Negative for shortness of breath.   Cardiovascular: Negative for chest pain and leg swelling.  Gastrointestinal: Negative for abdominal distention.  Psychiatric/Behavioral: Positive for agitation, dysphoric mood and sleep disturbance. Negative for confusion. The patient is nervous/anxious.        Objective:   Physical Exam  Constitutional: Vital signs are normal. He appears well-developed and well-nourished.  HENT:  Head: Normocephalic.  Right Ear: Hearing normal.  Left Ear: Hearing normal.  Nose: Nose normal.  Mouth/Throat: Oropharynx is clear and moist and mucous membranes are normal.  Neck: Trachea normal. Carotid bruit is not present. No thyroid mass and no thyromegaly present.  Cardiovascular: Normal rate, regular rhythm and normal pulses.  Exam reveals no gallop, no distant heart sounds and no friction rub.   No murmur heard. No peripheral edema  Pulmonary/Chest: Effort normal and breath sounds normal. No respiratory distress.  Skin: Skin is warm, dry and intact. No rash noted.  Psychiatric: His speech is normal. Judgment and thought  content normal. He is withdrawn. Cognition and memory are normal. He exhibits a depressed mood.          Assessment & Plan:

## 2016-09-09 NOTE — Assessment & Plan Note (Signed)
Symptomatic care 

## 2016-09-09 NOTE — Assessment & Plan Note (Signed)
Will treat in short term with alprazolam. Close follow up in 4 weeks.  Offered grief counseling referral.

## 2016-09-20 ENCOUNTER — Emergency Department: Payer: PPO

## 2016-09-20 ENCOUNTER — Inpatient Hospital Stay
Admission: EM | Admit: 2016-09-20 | Discharge: 2016-09-21 | DRG: 292 | Disposition: A | Discharge status: Home / Self Care | Payer: PPO | Attending: Internal Medicine | Admitting: Internal Medicine

## 2016-09-20 ENCOUNTER — Encounter: Payer: Self-pay | Admitting: Emergency Medicine

## 2016-09-20 DIAGNOSIS — I110 Hypertensive heart disease with heart failure: Secondary | ICD-10-CM | POA: Diagnosis not present

## 2016-09-20 DIAGNOSIS — I5033 Acute on chronic diastolic (congestive) heart failure: Secondary | ICD-10-CM | POA: Diagnosis not present

## 2016-09-20 DIAGNOSIS — I5031 Acute diastolic (congestive) heart failure: Secondary | ICD-10-CM | POA: Diagnosis not present

## 2016-09-20 DIAGNOSIS — I2510 Atherosclerotic heart disease of native coronary artery without angina pectoris: Secondary | ICD-10-CM | POA: Diagnosis present

## 2016-09-20 DIAGNOSIS — R079 Chest pain, unspecified: Secondary | ICD-10-CM

## 2016-09-20 DIAGNOSIS — K219 Gastro-esophageal reflux disease without esophagitis: Secondary | ICD-10-CM | POA: Diagnosis present

## 2016-09-20 DIAGNOSIS — Z809 Family history of malignant neoplasm, unspecified: Secondary | ICD-10-CM

## 2016-09-20 DIAGNOSIS — I10 Essential (primary) hypertension: Secondary | ICD-10-CM | POA: Diagnosis not present

## 2016-09-20 DIAGNOSIS — Z8249 Family history of ischemic heart disease and other diseases of the circulatory system: Secondary | ICD-10-CM

## 2016-09-20 DIAGNOSIS — Z7902 Long term (current) use of antithrombotics/antiplatelets: Secondary | ICD-10-CM

## 2016-09-20 DIAGNOSIS — Z825 Family history of asthma and other chronic lower respiratory diseases: Secondary | ICD-10-CM

## 2016-09-20 DIAGNOSIS — I509 Heart failure, unspecified: Secondary | ICD-10-CM | POA: Diagnosis not present

## 2016-09-20 DIAGNOSIS — E1165 Type 2 diabetes mellitus with hyperglycemia: Secondary | ICD-10-CM | POA: Diagnosis not present

## 2016-09-20 DIAGNOSIS — E11319 Type 2 diabetes mellitus with unspecified diabetic retinopathy without macular edema: Secondary | ICD-10-CM | POA: Diagnosis not present

## 2016-09-20 DIAGNOSIS — I5032 Chronic diastolic (congestive) heart failure: Secondary | ICD-10-CM

## 2016-09-20 DIAGNOSIS — R0600 Dyspnea, unspecified: Secondary | ICD-10-CM

## 2016-09-20 DIAGNOSIS — Z794 Long term (current) use of insulin: Secondary | ICD-10-CM | POA: Diagnosis not present

## 2016-09-20 DIAGNOSIS — E785 Hyperlipidemia, unspecified: Secondary | ICD-10-CM | POA: Diagnosis present

## 2016-09-20 DIAGNOSIS — Z833 Family history of diabetes mellitus: Secondary | ICD-10-CM

## 2016-09-20 DIAGNOSIS — Z7982 Long term (current) use of aspirin: Secondary | ICD-10-CM

## 2016-09-20 DIAGNOSIS — J810 Acute pulmonary edema: Secondary | ICD-10-CM | POA: Diagnosis not present

## 2016-09-20 DIAGNOSIS — Z9111 Patient's noncompliance with dietary regimen: Secondary | ICD-10-CM

## 2016-09-20 DIAGNOSIS — R0602 Shortness of breath: Secondary | ICD-10-CM | POA: Diagnosis not present

## 2016-09-20 DIAGNOSIS — I252 Old myocardial infarction: Secondary | ICD-10-CM | POA: Diagnosis not present

## 2016-09-20 DIAGNOSIS — Z9114 Patient's other noncompliance with medication regimen: Secondary | ICD-10-CM | POA: Diagnosis not present

## 2016-09-20 DIAGNOSIS — Z820 Family history of epilepsy and other diseases of the nervous system: Secondary | ICD-10-CM

## 2016-09-20 DIAGNOSIS — M1990 Unspecified osteoarthritis, unspecified site: Secondary | ICD-10-CM | POA: Diagnosis present

## 2016-09-20 DIAGNOSIS — I25118 Atherosclerotic heart disease of native coronary artery with other forms of angina pectoris: Secondary | ICD-10-CM | POA: Diagnosis present

## 2016-09-20 DIAGNOSIS — E1169 Type 2 diabetes mellitus with other specified complication: Secondary | ICD-10-CM | POA: Diagnosis present

## 2016-09-20 DIAGNOSIS — G8929 Other chronic pain: Secondary | ICD-10-CM | POA: Diagnosis present

## 2016-09-20 DIAGNOSIS — F430 Acute stress reaction: Secondary | ICD-10-CM | POA: Diagnosis not present

## 2016-09-20 DIAGNOSIS — Z6841 Body Mass Index (BMI) 40.0 and over, adult: Secondary | ICD-10-CM

## 2016-09-20 DIAGNOSIS — E119 Type 2 diabetes mellitus without complications: Secondary | ICD-10-CM | POA: Diagnosis not present

## 2016-09-20 DIAGNOSIS — Z955 Presence of coronary angioplasty implant and graft: Secondary | ICD-10-CM

## 2016-09-20 DIAGNOSIS — Z91199 Patient's noncompliance with other medical treatment and regimen due to unspecified reason: Secondary | ICD-10-CM

## 2016-09-20 DIAGNOSIS — R748 Abnormal levels of other serum enzymes: Secondary | ICD-10-CM | POA: Diagnosis not present

## 2016-09-20 HISTORY — DX: Heart failure, unspecified: I50.9

## 2016-09-20 LAB — CBC
HCT: 35.9 % — ABNORMAL LOW (ref 40.0–52.0)
HCT: 38.7 % — ABNORMAL LOW (ref 40.0–52.0)
Hemoglobin: 12.1 g/dL — ABNORMAL LOW (ref 13.0–18.0)
Hemoglobin: 12.8 g/dL — ABNORMAL LOW (ref 13.0–18.0)
MCH: 29.6 pg (ref 26.0–34.0)
MCH: 29.1 pg (ref 26.0–34.0)
MCHC: 33.6 g/dL (ref 32.0–36.0)
MCHC: 33 g/dL (ref 32.0–36.0)
MCV: 88.1 fL (ref 80.0–100.0)
MCV: 88.3 fL (ref 80.0–100.0)
Platelets: 204 10*3/uL (ref 150–440)
Platelets: 219 10*3/uL (ref 150–440)
RBC: 4.08 MIL/uL — ABNORMAL LOW (ref 4.40–5.90)
RBC: 4.38 MIL/uL — ABNORMAL LOW (ref 4.40–5.90)
RDW: 15.6 % — ABNORMAL HIGH (ref 11.5–14.5)
RDW: 15.8 % — ABNORMAL HIGH (ref 11.5–14.5)
WBC: 6.1 10*3/uL (ref 3.8–10.6)
WBC: 6 10*3/uL (ref 3.8–10.6)

## 2016-09-20 LAB — BASIC METABOLIC PANEL
Anion gap: 8 (ref 5–15)
BUN: 19 mg/dL (ref 6–20)
CO2: 30 mmol/L (ref 22–32)
Calcium: 8.9 mg/dL (ref 8.9–10.3)
Chloride: 104 mmol/L (ref 101–111)
Creatinine, Ser: 1 mg/dL (ref 0.61–1.24)
GFR calc Af Amer: >60 mL/min (ref >60)
GFR calc non Af Amer: >60 mL/min (ref >60)
Glucose, Bld: 158 mg/dL — ABNORMAL HIGH (ref 65–99)
Potassium: 3.9 mmol/L (ref 3.5–5.1)
Sodium: 142 mmol/L (ref 135–145)

## 2016-09-20 LAB — GLUCOSE, CAPILLARY
Glucose-Capillary: 149 mg/dL — ABNORMAL HIGH (ref 65–99)
Glucose-Capillary: 148 mg/dL — ABNORMAL HIGH (ref 65–99)
Glucose-Capillary: 279 mg/dL — ABNORMAL HIGH (ref 65–99)
Glucose-Capillary: 271 mg/dL — ABNORMAL HIGH (ref 65–99)

## 2016-09-20 LAB — BRAIN NATRIURETIC PEPTIDE: B Natriuretic Peptide: 249 pg/mL — ABNORMAL HIGH (ref 0.0–100.0)

## 2016-09-20 LAB — CREATININE, SERUM
Creatinine, Ser: 1.01 mg/dL (ref 0.61–1.24)
GFR calc Af Amer: >60 mL/min (ref >60)
GFR calc non Af Amer: >60 mL/min (ref >60)

## 2016-09-20 LAB — TROPONIN I
Troponin I: 0.07 ng/mL (ref <0.03)
Troponin I: 0.08 ng/mL (ref <0.03)
Troponin I: 0.07 ng/mL (ref <0.03)
Troponin I: 0.08 ng/mL (ref <0.03)
Troponin I: 0.07 ng/mL (ref <0.03)

## 2016-09-20 MED ORDER — ALPRAZOLAM 0.5 MG PO TABS
0.5000 mg | ORAL_TABLET | Freq: Two times a day (BID) | ORAL | Status: DC | PRN
  Administered 2016-09-20 – 2016-09-21 (×2): 0.5 mg via ORAL
  Filled 2016-09-20 (×2): qty 1

## 2016-09-20 MED ORDER — ISOSORBIDE MONONITRATE ER 30 MG PO TB24
30.0000 mg | ORAL_TABLET | Freq: Two times a day (BID) | ORAL | Status: DC
  Administered 2016-09-20 – 2016-09-21 (×3): 30 mg via ORAL
  Filled 2016-09-20 (×3): qty 1

## 2016-09-20 MED ORDER — FUROSEMIDE 10 MG/ML IJ SOLN
40.0000 mg | Freq: Once | INTRAMUSCULAR | Status: AC
  Administered 2016-09-20: 40 mg via INTRAVENOUS
  Filled 2016-09-20: qty 4

## 2016-09-20 MED ORDER — EZETIMIBE 10 MG PO TABS
10.0000 mg | ORAL_TABLET | Freq: Every day | ORAL | Status: DC
  Administered 2016-09-20 – 2016-09-21 (×2): 10 mg via ORAL
  Filled 2016-09-20 (×2): qty 1

## 2016-09-20 MED ORDER — CARVEDILOL 6.25 MG PO TABS
6.2500 mg | ORAL_TABLET | Freq: Two times a day (BID) | ORAL | Status: DC
Start: 2016-09-20 — End: 2016-09-21
  Administered 2016-09-20 – 2016-09-21 (×2): 6.25 mg via ORAL
  Filled 2016-09-20 (×2): qty 1

## 2016-09-20 MED ORDER — DICLOFENAC SODIUM 75 MG PO TBEC
75.0000 mg | DELAYED_RELEASE_TABLET | Freq: Two times a day (BID) | ORAL | Status: DC
  Administered 2016-09-20 – 2016-09-21 (×3): 75 mg via ORAL
  Filled 2016-09-20 (×4): qty 1

## 2016-09-20 MED ORDER — SODIUM CHLORIDE 0.9% FLUSH
3.0000 mL | Freq: Two times a day (BID) | INTRAVENOUS | Status: DC
  Administered 2016-09-20 – 2016-09-21 (×3): 3 mL via INTRAVENOUS

## 2016-09-20 MED ORDER — NITROGLYCERIN 2 % TD OINT
1.0000 [in_us] | TOPICAL_OINTMENT | Freq: Once | TRANSDERMAL | Status: AC
  Administered 2016-09-20: 1 [in_us] via TOPICAL
  Filled 2016-09-20: qty 1

## 2016-09-20 MED ORDER — CYCLOBENZAPRINE HCL 10 MG PO TABS: 10.0000 mg | ORAL_TABLET | Freq: Three times a day (TID) | ORAL | Status: DC | PRN

## 2016-09-20 MED ORDER — INSULIN ASPART PROT & ASPART (70-30 MIX) 100 UNIT/ML ~~LOC~~ SUSP
50.0000 [IU] | Freq: Two times a day (BID) | SUBCUTANEOUS | Status: DC
  Administered 2016-09-20 – 2016-09-21 (×2): 50 [IU] via SUBCUTANEOUS
  Filled 2016-09-20 (×2): qty 50

## 2016-09-20 MED ORDER — FUROSEMIDE 10 MG/ML IJ SOLN
40.0000 mg | Freq: Two times a day (BID) | INTRAMUSCULAR | Status: DC
  Administered 2016-09-20 – 2016-09-21 (×2): 40 mg via INTRAVENOUS
  Filled 2016-09-20 (×2): qty 4

## 2016-09-20 MED ORDER — CLOPIDOGREL BISULFATE 75 MG PO TABS
75.0000 mg | ORAL_TABLET | Freq: Every day | ORAL | Status: DC
  Administered 2016-09-21: 75 mg via ORAL
  Filled 2016-09-20: qty 1

## 2016-09-20 MED ORDER — ALUM & MAG HYDROXIDE-SIMETH 200-200-20 MG/5ML PO SUSP
30.0000 mL | Freq: Four times a day (QID) | ORAL | Status: DC | PRN
  Administered 2016-09-20: 30 mL via ORAL
  Filled 2016-09-20: qty 30

## 2016-09-20 MED ORDER — PRAVASTATIN SODIUM 20 MG PO TABS
40.0000 mg | ORAL_TABLET | Freq: Every day | ORAL | Status: DC
  Administered 2016-09-20 – 2016-09-21 (×2): 40 mg via ORAL
  Filled 2016-09-20: qty 2
  Filled 2016-09-20: qty 1

## 2016-09-20 MED ORDER — ENOXAPARIN SODIUM 40 MG/0.4ML ~~LOC~~ SOLN
40.0000 mg | SUBCUTANEOUS | Status: DC
  Administered 2016-09-21: 40 mg via SUBCUTANEOUS
  Filled 2016-09-20: qty 0.4

## 2016-09-20 MED ORDER — INSULIN ASPART 100 UNIT/ML ~~LOC~~ SOLN
0.0000 [IU] | Freq: Three times a day (TID) | SUBCUTANEOUS | Status: DC
  Administered 2016-09-20: 5 [IU] via SUBCUTANEOUS
  Filled 2016-09-20: qty 5

## 2016-09-20 MED ORDER — NITROGLYCERIN 0.4 MG SL SUBL: 0.4000 mg | SUBLINGUAL_TABLET | SUBLINGUAL | Status: DC | PRN

## 2016-09-20 MED ORDER — ASPIRIN EC 81 MG PO TBEC
81.0000 mg | DELAYED_RELEASE_TABLET | Freq: Every day | ORAL | Status: DC
  Administered 2016-09-20 – 2016-09-21 (×2): 81 mg via ORAL
  Filled 2016-09-20 (×2): qty 1

## 2016-09-20 NOTE — Progress Notes (Signed)
Patient admitted to unit. Oriented to room, call bell, and staff. Bed in lowest position. Fall safety plan reviewed. Full assessment to Epic. Skin assessment verified with Serenity RN. Telemetry box verification with tele clerk and Natalie NT- Box#: [id number]. Will continue to monitor.

## 2016-09-20 NOTE — ED Notes (Signed)
SpO2 91%. Patient placed on 2L [place].  Patient tolerating well.   SpO2 99%.

## 2016-09-20 NOTE — ED Notes (Signed)
Patient given lunch tray.

## 2016-09-20 NOTE — ED Notes (Signed)
Waiting for pharmacy to send up missing medication.

## 2016-09-20 NOTE — H&P (Signed)
[employer] [facility] [facility] [place] [facility]   PATIENT NAME: [name]    MR#:  [id number]  DATE OF BIRTH:  [birth year]  DATE OF ADMISSION:  09/20/2016  PRIMARY CARE PHYSICIAN: Kerby NoraAmy Bedsole, MD   REQUESTING/REFERRING PHYSICIAN: Zenda AlpersWebster  CHIEF COMPLAINT:   Chief Complaint  Patient presents with  . Chest Pain  . Shortness of Breath    HISTORY OF PRESENT ILLNESS: [name]  is a 66 y.o. male with a known history of CAD, CHF, Htn, DM, obesity- his wife died 3 weeks ago. He drinks a lot of liquid daily. Claims " NOBODY TOLD ME ABOUT THIS BEFORE" drinks a gallon of green tea in a day and 2-3 bottles of diet coke. He was advised to take lasix tab- when have swelling on legs, taking it- but still very SOB with walking just 10 steps and can not sleep at night in bed for last 2 days. At baseline- he hae very poor functional status- can not walk from parking lot to the store or in hospital. Noted to have CHF exacerbation.  PAST MEDICAL HISTORY:   Past Medical History:  Diagnosis Date  . Back injury 02/2002   worker's comp  . CHF (congestive heart failure) (HCC)   . Coronary artery disease, non-occlusive    a. cath 2002 with no sig CAD;  b. cath 2008 normal LM, LAD, LCx, p&dRCA 20-30%, PDA 30%; c.11/2015 NSTEMI/PCI: LM nl, LAD 95p (2.5x15 Xience DES), LCX nl, RCA 100p/m w/ L->R collats, EF 55-65% c. NSTEMI (02/2016) with no culprit leision, switched to Brilinta.  d. NSTEMI 03/2016: again, no culprit lesion and switched back to plavix 2/2 SOB with Brilnta.    . Depression   . Diabetes mellitus type 2, insulin dependent (HCC)   . Hyperlipemia   . Hypertensive heart disease   . Kidney stones   . Morbid obesity (HCC)   . Osteoarthritis   . Snoring     PAST SURGICAL HISTORY: Past Surgical History:  Procedure Laterality Date  . CARDIAC CATHETERIZATION  09/2000   diffuse LAD 30% LCA  EF 50-60%  . CARDIAC CATHETERIZATION  06/2007   no significant CAD  . CARDIAC CATHETERIZATION N/A  12/25/2015   Procedure: Left Heart Cath and Coronary Angiography;  Surgeon: Antonieta Ibaimothy J Gollan, MD;  Location: [facility] INVASIVE CV LAB;  Service: Cardiovascular;  Laterality: N/A;  . CARDIAC CATHETERIZATION N/A 12/25/2015   Procedure: Coronary Stent Intervention;  Surgeon: Alwyn Peawayne D Callwood, MD;  Location: [facility] INVASIVE CV LAB;  Service: Cardiovascular;  Laterality: N/A;  . CARDIAC CATHETERIZATION N/A 03/10/2016   Procedure: Left Heart Cath and Coronary Angiography;  Surgeon: Iran OuchMuhammad A Arida, MD;  Location: [facility] INVASIVE CV LAB;  Service: Cardiovascular;  Laterality: N/A;  . CARDIAC CATHETERIZATION N/A 04/06/2016   Procedure: Left Heart Cath and Coronary Angiography;  Surgeon: Lyn RecordsHenry W Smith, MD;  [facility];  Service: Cardiovascular;  Laterality: N/A;  . CIRCUMCISION      SOCIAL HISTORY:  Social History  Substance Use Topics  . Smoking status: Never Smoker  . Smokeless tobacco: Never Used  . Alcohol use No    FAMILY HISTORY:  Family History  Problem Relation Age of Onset  . Alzheimer's disease Mother   . Emphysema Mother   . Diabetes Father   . Heart disease Father     MI  . Cancer Brother     ? Neck cancer    DRUG ALLERGIES:  Allergies  Allergen Reactions  . Atorvastatin Other (  See Comments)    Body aches    REVIEW OF SYSTEMS:   CONSTITUTIONAL: No fever, fatigue or weakness.  EYES: No blurred or double vision.  EARS, NOSE, AND THROAT: No tinnitus or ear pain.  RESPIRATORY: No cough,have shortness of breath,no wheezing or hemoptysis.  CARDIOVASCULAR: No chest pain,have severe orthopnea, edema.  GASTROINTESTINAL: No nausea, vomiting, diarrhea or abdominal pain.  GENITOURINARY: No dysuria, hematuria.  ENDOCRINE: No polyuria, nocturia,  HEMATOLOGY: No anemia, easy bruising or bleeding SKIN: No rash or lesion. MUSCULOSKELETAL: No joint pain or arthritis.   NEUROLOGIC: No tingling, numbness, weakness.  PSYCHIATRY: No anxiety or depression.   MEDICATIONS AT  HOME:  Prior to Admission medications   Medication Sig Start Date End Date Taking? Authorizing Provider  ALPRAZolam Prudy Feeler) 0.25 MG tablet Take 2 tablets (0.5 mg total) by mouth 2 (two) times daily as needed for anxiety. 09/09/16   Amy Michelle Nasuti, MD  aspirin 81 MG EC tablet Take 1 tablet (81 mg total) by mouth daily. 01/12/16   Ok Anis, NP  carvedilol (COREG) 6.25 MG tablet TAKE 1 TABLET BY MOUTH ONCE DAILY 08/09/16   Amy Michelle Nasuti, MD  clopidogrel (PLAVIX) 75 MG tablet Take 1 tablet (75 mg total) by mouth daily with breakfast. 04/07/16   Janetta Hora, PA-C  cyclobenzaprine (FLEXERIL) 10 MG tablet Take 1 tablet (10 mg total) by mouth every 8 (eight) hours as needed for muscle spasms. 07/29/16   Amy Michelle Nasuti, MD  diclofenac (VOLTAREN) 75 MG EC tablet Take 75 mg by mouth 2 (two) times daily.    Historical Provider, MD  ezetimibe (ZETIA) 10 MG tablet Take 1 tablet (10 mg total) by mouth daily. Patient not taking: Reported on 09/09/2016 03/24/16   [name], MD  furosemide (LASIX) 40 MG tablet  08/26/16   Historical Provider, MD  insulin NPH-regular Human (NOVOLIN 70/30 RELION) (70-30) 100 UNIT/ML injection Inject 80 units every morning and 70 units every evening Patient taking differently: Inject 60-80 Units into the skin 2 (two) times daily. Inject 80 units subcutaneously before breakfast and 60 units before supper 03/28/16   Amy E Ermalene Searing, MD  isosorbide mononitrate (IMDUR) 30 MG 24 hr tablet Take 1 tablet (30 mg total) by mouth 2 (two) times daily. 07/05/16   Antonieta Iba, MD  nitroGLYCERIN (NITROSTAT) 0.4 MG SL tablet Place 1 tablet (0.4 mg total) under the tongue every 5 (five) minutes as needed for chest pain. 07/05/16   Antonieta Iba, MD  ONE TOUCH ULTRA TEST test strip  07/26/16   Historical Provider, MD  pravastatin (PRAVACHOL) 40 MG tablet Take 40 mg by mouth daily.    Historical Provider, MD  PRESCRIPTION MEDICATION Eye drops - administer day before and day after eye  injections or laser treatments - next office visit mid December 2017    Historical Provider, MD      PHYSICAL EXAMINATION:   VITAL SIGNS: Blood pressure (!) 161/80, pulse 81, temperature 97.7 F (36.5 C), temperature source Oral, resp. rate 17, height 5\' 7"  (1.702 m), weight (!) 138.3 kg (305 lb), SpO2 95 %.  GENERAL:  66 y.o.-year-old obese patient lying in the bed with no acute distress.  EYES: Pupils equal, round, reactive to light and accommodation. No scleral icterus. Extraocular muscles intact.  HEENT: Head atraumatic, normocephalic. Oropharynx and nasopharynx clear.  NECK:  Supple, no jugular venous distention. No thyroid enlargement, no tenderness.  LUNGS: Normal breath sounds bilaterally, no wheezing, b/l crepitation. No use of  accessory muscles of respiration.  CARDIOVASCULAR: S1, S2 normal. No murmurs, rubs, or gallops.  ABDOMEN: Soft, nontender, nondistended. Bowel sounds present. No organomegaly or mass.  EXTREMITIES: b/l pedal edema, no cyanosis, or clubbing.  NEUROLOGIC: Cranial nerves II through XII are intact. Muscle strength 5/5 in all extremities. Sensation intact. Gait not checked.  PSYCHIATRIC: The patient is alert and oriented x 3.  SKIN: No obvious rash, lesion, or ulcer.   LABORATORY PANEL:   CBC  Recent Labs Lab 09/20/16 0507  WBC 6.1  HGB 12.1*  HCT 35.9*  PLT 204  MCV 88.1  MCH 29.6  MCHC 33.6  RDW 15.6*   ------------------------------------------------------------------------------------------------------------------  Chemistries   Recent Labs Lab 09/20/16 0507  NA 142  K 3.9  CL 104  CO2 30  GLUCOSE 158*  BUN 19  CREATININE 1.00  CALCIUM 8.9   ------------------------------------------------------------------------------------------------------------------ estimated creatinine clearance is 97.6 mL/min (by C-G formula based on SCr of 1  mg/dL). ------------------------------------------------------------------------------------------------------------------ No results for input(s): TSH, T4TOTAL, T3FREE, THYROIDAB in the last 72 hours.  Invalid input(s): FREET3   Coagulation profile No results for input(s): INR, PROTIME in the last 168 hours. ------------------------------------------------------------------------------------------------------------------- No results for input(s): DDIMER in the last 72 hours. -------------------------------------------------------------------------------------------------------------------  Cardiac Enzymes  Recent Labs Lab 09/20/16 0507  TROPONINI 0.07*   ------------------------------------------------------------------------------------------------------------------ Invalid input(s): POCBNP  ---------------------------------------------------------------------------------------------------------------  Urinalysis    Component Value Date/Time   COLORURINE YELLOW (A) 09/28/2015 1243   APPEARANCEUR CLEAR (A) 09/28/2015 1243   LABSPEC 1.025 09/28/2015 1243   PHURINE 6.0 09/28/2015 1243   GLUCOSEU >500 (A) 09/28/2015 1243   HGBUR NEGATIVE 09/28/2015 1243   BILIRUBINUR NEGATIVE 09/28/2015 1243   KETONESUR NEGATIVE 09/28/2015 1243   PROTEINUR NEGATIVE 09/28/2015 1243   UROBILINOGEN 1.0 05/19/2009 1218   NITRITE NEGATIVE 09/28/2015 1243   LEUKOCYTESUR NEGATIVE 09/28/2015 1243     RADIOLOGY: Dg Chest 1 View  Result Date: 09/20/2016 CLINICAL DATA:  Initial evaluation for acute chest pain, shortness of breath. EXAM: CHEST 1 VIEW COMPARISON:  Prior radiograph from 07/03/2016. FINDINGS: Cardiomegaly, similar to previous. Mediastinal silhouette within normal limits. Lungs normally inflated. Diffuse pulmonary vascular congestion with interstitial prominence, compatible with pulmonary interstitial edema. No focal infiltrates. The no definite pleural effusion. No pneumothorax. No acute  osseous abnormality. IMPRESSION: Cardiomegaly with diffuse pulmonary vascular congestion and interstitial prominence, compatible with pulmonary interstitial edema. Electronically Signed   By: Rise Mu M.D.   On: 09/20/2016 06:06    EKG: Orders placed or performed during the hospital encounter of 09/20/16  . EKG 12-Lead  . EKG 12-Lead  . ED EKG within 10 minutes  . ED EKG within 10 minutes    IMPRESSION AND PLAN:  * Ac on Ch diastolic CHF   Recent Echo, no need to repeat.   I councelled about fluid restriction. Daily weights   I/o monitor.   IV lasix, Tele, troponin monitor.   Consult cardio.  * DM   Ins 70/30 - 50 U BID   ISS.  * Htn   Coreg, lasix  * Hyperlipidemia   Statin.  * CAD   Cont home meds.   All the records are reviewed and case discussed with ED provider. Management plans discussed with the patient, family and they are in agreement.  CODE STATUS: Full. Code Status History    Date Active Date Inactive Code Status Order ID Comments User Context   04/06/2016  1:28 AM 04/07/2016  2:36 PM Full Code [id number]  Nevin Bloodgood, MD Inpatient   03/09/2016  10:34 AM 03/11/2016  3:22 PM Full Code [id number]  Alford Highland, MD ED   12/25/2015  1:44 PM 12/26/2015  2:08 PM Full Code [id number]  Alwyn Pea, MD Inpatient   12/24/2015  6:14 PM 12/25/2015  1:44 PM Full Code [id number]  Enedina Finner, MD Inpatient       TOTAL TIME TAKING CARE OF THIS PATIENT: 50 minutes.    Altamese Dilling M.D on 09/20/2016   Between 7am to 6pm - Pager - [phone]  After 6pm go to [email or web address] - password [email or web address]  [facility]  Office  [phone]  CC: Primary care physician; Kerby Nora, MD   Note: This dictation was prepared with Dragon dictation along with smaller phrase technology. Any transcriptional errors that result from this process are unintentional.

## 2016-09-20 NOTE — ED Notes (Addendum)
Pt oxygen saturation dropped to 89% when moving from wheelchair to stretcher. Increased respirations and labor breathing noted upon getting into stretcher. Pt placed on 2 L nasal cannula. Pt instructed to take slow deep breaths in through his nose and out through his mouth.

## 2016-09-20 NOTE — ED Provider Notes (Signed)
[facility] Emergency Department Provider Note   ____________________________________________   First MD Initiated Contact with Patient 09/20/16 0518     (approximate)  I have reviewed the triage vital signs and the nursing notes.   HISTORY  Chief Complaint Chest Pain and Shortness of Breath    HPI [name] is a 66 y.o. male who comes into the hospital today with some shortness of breath. He reports that he feels swollen. He started having symptoms yesterday. He reports that he only takes his Lasix as needed. He reports that he was told to take it when he used to. He did not take it yesterday but did take about 2 days ago. He reports that he gets really short of breath on exertion and he is unable to lay flat. The patient does not wear oxygen at home. He's had 3 heart attacks last year and reports that ever since then he is always had a little bit of chest discomfort. He says it hurts a little bit in his chest but nothing severe. He said that his wife passed away 3 weeks ago and he's been feeling very stressed. He reports that he's been unable to sleep because he so swollen and so uncomfortable. The patient decided to come into the hospital today for evaluation. He was going to wait to see his cardiologist but decided he could not do that anymore.   Past Medical History:  Diagnosis Date  . Back injury 02/2002   worker's comp  . CHF (congestive heart failure) (HCC)   . Coronary artery disease, non-occlusive    a. cath 2002 with no sig CAD;  b. cath 2008 normal LM, LAD, LCx, p&dRCA 20-30%, PDA 30%; c.11/2015 NSTEMI/PCI: LM nl, LAD 95p (2.5x15 Xience DES), LCX nl, RCA 100p/m w/ L->R collats, EF 55-65% c. NSTEMI (02/2016) with no culprit leision, switched to Brilinta.  d. NSTEMI 03/2016: again, no culprit lesion and switched back to plavix 2/2 SOB with Brilnta.    . Depression   . Diabetes mellitus type 2, insulin dependent (HCC)   . Hyperlipemia   .  Hypertensive heart disease   . Kidney stones   . Morbid obesity (HCC)   . Osteoarthritis   . Snoring     Patient Active Problem List   Diagnosis Date Noted  . Viral URI with cough 09/09/2016  . Adjustment disorder with mixed anxiety and depressed mood 09/09/2016  . GERD (gastroesophageal reflux disease) 09/09/2016  . Coronary artery disease, non-occlusive   . Hyperlipemia   . Elevated left ventricular end-diastolic pressure (LVEDP) 03/11/2016  . Leg swelling 03/11/2016  . Anemia 03/11/2016  . Essential hypertension   . Statin intolerance   . Hypertensive heart disease   . Diabetes mellitus type 2, insulin dependent (HCC)   . Coronary artery disease involving native coronary artery of native heart with unstable angina pectoris (HCC)   . Morbid obesity (HCC)   . Chronic chest pain   . NSTEMI (non-ST elevated myocardial infarction) (HCC)   . Uncontrolled type 2 diabetes mellitus with circulatory disorder (HCC)   . Advanced care planning/counseling discussion 11/20/2015  . Noncompliance with diabetes treatment 11/05/2015  . Noncompliance with diet and medication regimen 11/05/2015  . Right medial knee pain 09/23/2014  . Diabetic retinopathy (HCC) 09/23/2014  . Snoring 12/21/2012  . HYPOGONADISM 09/28/2010  . VITAMIN B12 DEFICIENCY 09/28/2010  . UNSPECIFIED VITAMIN D DEFICIENCY 09/28/2010  . OTHER MALAISE AND FATIGUE 09/17/2010  . TRIGGER FINGER, RIGHT MIDDLE 09/14/2010  .  BRANCH RETINAL VEIN OCCLUSION 11/26/2009  . LEG PAIN, BILATERAL 10/06/2009  . CONSTIPATION 08/10/2009  . GASTRITIS 07/29/2009  . Major depressive disorder, recurrent episode, moderate (HCC) 07/06/2007  . ERECTILE DYSFUNCTION 04/10/2007  . Uncontrolled diabetes mellitus with retinopathy (HCC) 03/26/2007  . OBESITY 03/26/2007  . RENAL CALCULUS, HX OF 03/26/2007  . Hyperlipidemia LDL goal <70 12/05/2006  . Essential hypertension, benign 12/05/2006  . OSTEOARTHRITIS 12/05/2006    Past Surgical History:    Procedure Laterality Date  . CARDIAC CATHETERIZATION  09/2000   diffuse LAD 30% LCA  EF 50-60%  . CARDIAC CATHETERIZATION  06/2007   no significant CAD  . CARDIAC CATHETERIZATION N/A 12/25/2015   Procedure: Left Heart Cath and Coronary Angiography;  Surgeon: Antonieta Iba, MD;  Location: [facility] INVASIVE CV LAB;  Service: Cardiovascular;  Laterality: N/A;  . CARDIAC CATHETERIZATION N/A 12/25/2015   Procedure: Coronary Stent Intervention;  Surgeon: Alwyn Pea, MD;  Location: [facility] INVASIVE CV LAB;  Service: Cardiovascular;  Laterality: N/A;  . CARDIAC CATHETERIZATION N/A 03/10/2016   Procedure: Left Heart Cath and Coronary Angiography;  Surgeon: Iran Ouch, MD;  Location: [facility] INVASIVE CV LAB;  Service: Cardiovascular;  Laterality: N/A;  . CARDIAC CATHETERIZATION N/A 04/06/2016   Procedure: Left Heart Cath and Coronary Angiography;  Surgeon: Lyn Records, MD;  Location: [facility] INVASIVE CV LAB;  Service: Cardiovascular;  Laterality: N/A;  . CIRCUMCISION      Prior to Admission medications   Medication Sig Start Date End Date Taking? Authorizing Provider  ALPRAZolam Prudy Feeler) 0.25 MG tablet Take 2 tablets (0.5 mg total) by mouth 2 (two) times daily as needed for anxiety. 09/09/16   Amy Michelle Nasuti, MD  aspirin 81 MG EC tablet Take 1 tablet (81 mg total) by mouth daily. 01/12/16   Ok Anis, NP  carvedilol (COREG) 6.25 MG tablet TAKE 1 TABLET BY MOUTH ONCE DAILY 08/09/16   Amy Michelle Nasuti, MD  clopidogrel (PLAVIX) 75 MG tablet Take 1 tablet (75 mg total) by mouth daily with breakfast. 04/07/16   Janetta Hora, PA-C  cyclobenzaprine (FLEXERIL) 10 MG tablet Take 1 tablet (10 mg total) by mouth every 8 (eight) hours as needed for muscle spasms. 07/29/16   Amy Michelle Nasuti, MD  diclofenac (VOLTAREN) 75 MG EC tablet Take 75 mg by mouth 2 (two) times daily.    Historical Provider, MD  ezetimibe (ZETIA) 10 MG tablet Take 1 tablet (10 mg total) by mouth daily. Patient not taking: Reported on  09/09/2016 03/24/16   [name], MD  furosemide (LASIX) 40 MG tablet  08/26/16   Historical Provider, MD  insulin NPH-regular Human (NOVOLIN 70/30 RELION) (70-30) 100 UNIT/ML injection Inject 80 units every morning and 70 units every evening Patient taking differently: Inject 60-80 Units into the skin 2 (two) times daily. Inject 80 units subcutaneously before breakfast and 60 units before supper 03/28/16   Amy E Ermalene Searing, MD  isosorbide mononitrate (IMDUR) 30 MG 24 hr tablet Take 1 tablet (30 mg total) by mouth 2 (two) times daily. 07/05/16   Antonieta Iba, MD  nitroGLYCERIN (NITROSTAT) 0.4 MG SL tablet Place 1 tablet (0.4 mg total) under the tongue every 5 (five) minutes as needed for chest pain. 07/05/16   Antonieta Iba, MD  ONE TOUCH ULTRA TEST test strip  07/26/16   Historical Provider, MD  pravastatin (PRAVACHOL) 40 MG tablet Take 40 mg by mouth daily.    Historical Provider, MD  PRESCRIPTION MEDICATION Eye drops -  administer day before and day after eye injections or laser treatments - next office visit mid December 2017    Historical Provider, MD    Allergies Atorvastatin  Family History  Problem Relation Age of Onset  . Alzheimer's disease Mother   . Emphysema Mother   . Diabetes Father   . Heart disease Father     MI  . Cancer Brother     ? Neck cancer    Social History Social History  Substance Use Topics  . Smoking status: Never Smoker  . Smokeless tobacco: Never Used  . Alcohol use No    Review of Systems Constitutional: No fever/chills Eyes: No visual changes. ENT: No sore throat. Cardiovascular:  chest pain. Respiratory: shortness of breath. Gastrointestinal: No abdominal pain.  No nausea, no vomiting.  No diarrhea.  No constipation. Genitourinary: Negative for dysuria. Musculoskeletal: Negative for back pain. Skin: Negative for rash. Neurological: Negative for headaches, focal weakness or numbness.  10-point ROS otherwise  negative.  ____________________________________________   PHYSICAL EXAM:  VITAL SIGNS: ED Triage Vitals  Enc Vitals Group     BP 09/20/16 0445 (!) 194/91     Pulse Rate 09/20/16 0445 85     Resp 09/20/16 0445 20     Temp 09/20/16 0445 97.7 F (36.5 C)     Temp Source 09/20/16 0445 Oral     SpO2 09/20/16 0445 96 %     Weight 09/20/16 0446 (!) 305 lb (138.3 kg)     Height 09/20/16 0446 5\' 7"  (1.702 m)     Head Circumference --      Peak Flow --      Pain Score 09/20/16 0446 3     Pain Loc --      Pain Edu? --      Excl. in GC? --     Constitutional: Alert and oriented. Well appearing and in moderate distress. Eyes: Conjunctivae are normal. PERRL. EOMI. Head: Atraumatic. Nose: No congestion/rhinnorhea. Mouth/Throat: Mucous membranes are moist.  Oropharynx non-erythematous. Cardiovascular: Normal rate, regular rhythm. Grossly normal heart sounds.  Good peripheral circulation. Respiratory: Normal respiratory effort.  No retractions. Diminished breath sounds throughout. Gastrointestinal: Soft and nontender. No distention. Positive bowel sounds Musculoskeletal: Bilateral lower extremity pitting edema  Neurologic:  Normal speech and language.  Skin:  Skin is warm, dry and intact.  Psychiatric: Mood and affect are normal.   ____________________________________________   LABS (all labs ordered are listed, but only abnormal results are displayed)  Labs Reviewed  BASIC METABOLIC PANEL - Abnormal; Notable for the following:       Result Value   Glucose, Bld 158 (*)    All other components within normal limits  CBC - Abnormal; Notable for the following:    RBC 4.08 (*)    Hemoglobin 12.1 (*)    HCT 35.9 (*)    RDW 15.6 (*)    All other components within normal limits  TROPONIN I - Abnormal; Notable for the following:    Troponin I 0.07 (*)    All other components within normal limits  BRAIN NATRIURETIC PEPTIDE - Abnormal; Notable for the following:    B Natriuretic  Peptide 249.0 (*)    All other components within normal limits   ____________________________________________  EKG  ED ECG REPORT I, Rebecka Apley, the attending physician, personally viewed and interpreted this ECG.   Date: 09/20/2016  EKG Time: 447  Rate: 83  Rhythm: normal sinus rhythm  Axis: normal  Intervals:none  ST&T Change: Flipped T waves in leads 1, aVL  ____________________________________________  RADIOLOGY  CXR ____________________________________________   PROCEDURES  Procedure(s) performed: None  Procedures  Critical Care performed: No  ____________________________________________   INITIAL IMPRESSION / ASSESSMENT AND PLAN / ED COURSE  Pertinent labs & imaging results that were available during my care of the patient were reviewed by me and considered in my medical decision making (see chart for details).  This is a 66 year old male who comes into the hospital today with some shortness of breath. He reports he has also been swelling in his lower legs. He reports that he is unable to walk without getting extremely short of breath and he cannot lay flat. The patient does have a history of heart attacks and heart failure in the past. He reports though that he only takes his Lasix as needed. I will give the patient a dose of nitroglycerin paste to his chest as well as a dose of Lasix. Given the patient's edema and the fact that he was hypoxic when he initially arrived I will admit the patient to the hospitalist service. The patient also appears to have some EKG changes compared to November 2017.  Clinical Course as of Sep 20 709  Tue Sep 20, 2016  [id number] Cardiomegaly with diffuse pulmonary vascular congestion and interstitial prominence, compatible with pulmonary interstitial edema.   DG Chest 1 View [AW]    Clinical Course User Index [AW] Rebecka ApleyAllison P Niraj Kudrna, MD     ____________________________________________   FINAL CLINICAL IMPRESSION(S) /  ED DIAGNOSES  Final diagnoses:  Acute on chronic congestive heart failure, unspecified congestive heart failure type (HCC)  Dyspnea, unspecified type  Acute pulmonary edema (HCC)      NEW MEDICATIONS STARTED DURING THIS VISIT:  New Prescriptions   No medications on file     Note:  This document was prepared using Dragon voice recognition software and may include unintentional dictation errors.    Rebecka ApleyAllison P Valente Fosberg, MD 09/20/16 [phone]

## 2016-09-20 NOTE — ED Notes (Signed)
Admitting MD at bedside.

## 2016-09-20 NOTE — ED Notes (Signed)
Patient here for SOB. Patient states he has been under a great deal of stress, wife passed two weeks ago. Patient A&O x4. Denies pain or needs at this time. SpO2 WNL, O2 removed per patient request. Maintaining SpO2 WNL. Call light within reach. Daughter at bedside.

## 2016-09-20 NOTE — Consult Note (Signed)
Cardiology Consultation Note  Patient ID: [name], MRN: [id number], DOB/AGE: May 01, 1951 66 y.o. Admit date: 09/20/2016   Date of Consult: 09/20/2016 Primary Physician: Kerby Nora, MD Primary Cardiologist: Dr. Mariah Milling, MD Requesting Physician: Dr. Elisabeth Pigeon, MD  Chief Complaint: SOB Reason for Consult: Acute on chronic diastolic CHF  HPI: 66 y.o. male with h/o CAD with NSTEMI in 11/2015 with PCI to LAD with recurrent NSTEMI x2 in 02/2016 and 03/2016 medically managed, chronic diastolic CHF, morbid obesity, HTN, DM, chronic fatigue, and deconditioning who presented to [facility] on 1/23 with increased SOB.   He has a cardiac history dating back to 2002 at which time, he underwent diagnostic catheterization revealing no significant CAD. Since gone catheterization 2008 again showed nonobstructive CAD. Admitted to [facility] 11/2015 for NSTEMI. Cardiac cath showed CTO of the RCA with severe proximal LAD stenosis. The LAD was felt to be the culprit lesion and was successfully stented. He was placed on ASA and Plavix. Admitted again in 02/2016 for NSTEMI. No culprit lesion was seen on repeat cardiac cath on 03/10/2016. Felt to be possibly demand ischemia in the setting of the patient's CTO of the RCA with underlying hpertensive heart disease and moderately elevated LVEDP. He was switched to Brilinta. Admitted for a 3rd time in August 2017 for NSTEMI with cardiac cath on 04/06/2016 showed CTO of teh RCA in the proximal segment well collateralized from the LCx and LAD. Widely patent proximal to mid LAD stent previously placed. There as D1 and LAD 30 and 50% stenosis respectively. Widely patent LCx unchanged from previous cath with 70% narrowing in a small branch of the OM1. LCx collateralizes the distal right coronary left ventricular branch. Inferobasal hypokinesis with an EF of 50%. EDP was normal. Brilinta has lead to some SOB. At his last office visit with Dr. Mariah Milling he was needing Lasix only prn for LE swelling and was  doing well. Most recent echo from 02/2016 showed an EF of 50-55%, no RWMA, GR2DD.   Sadly, his wife passed away 3 weeks prior.   He presented to [facility] with a 2 day history of increased SOB. He reports SOB with conversation and now becomes SOB with taking just several steps. He has been helping with his ill wife for the past several months and just has not been taking good care of himself. He does drink large quantities of tea, drinking 1 gallon of tea daily along with 2-3 sodas. Upon his arrival to [facility] he was noted to have a CXR that showed cardiomegaly with diffuse pulmonary vascular congestion and interstital edema. BNP 249. Troponin 0.07-->0.08. SCr 1.00, K+ 3.9. WBC 6.1, HGB 12.1, PLT 204. EKG NSR, 83 bpm, nonspecific st/t changes. He was noted to be markedly hypertensive with a SBP of 194 mmHg upon ED presentation that has since improved to 154 systolic. He was given IV Lasix 40 mg in the ED with a UOP of 1.5 L to date. IM placed him on IV Lasix 40 mg bid. He was continued on Coreg, ASA, Plavix, Imdur 30, pravastatin.    Past Medical History:  Diagnosis Date  . Back injury 02/2002   worker's comp  . CHF (congestive heart failure) (HCC)   . Coronary artery disease, non-occlusive    a. cath 2002 with no sig CAD;  b. cath 2008 normal LM, LAD, LCx, p&dRCA 20-30%, PDA 30%; c.11/2015 NSTEMI/PCI: LM nl, LAD 95p (2.5x15 Xience DES), LCX nl, RCA 100p/m w/ L->R collats, EF 55-65% c. NSTEMI (02/2016) with no culprit  leision, switched to Brilinta.  d. NSTEMI 03/2016: again, no culprit lesion and switched back to plavix 2/2 SOB with Brilnta.    . Depression   . Diabetes mellitus type 2, insulin dependent (HCC)   . Hyperlipemia   . Hypertensive heart disease   . Kidney stones   . Morbid obesity (HCC)   . Osteoarthritis   . Snoring       Most Recent Cardiac Studies: As above   Surgical History:  Past Surgical History:  Procedure Laterality Date  . CARDIAC CATHETERIZATION  09/2000   diffuse LAD 30% LCA   EF 50-60%  . CARDIAC CATHETERIZATION  06/2007   no significant CAD  . CARDIAC CATHETERIZATION N/A 12/25/2015   Procedure: Left Heart Cath and Coronary Angiography;  Surgeon: Antonieta Iba, MD;  Location: [facility] INVASIVE CV LAB;  Service: Cardiovascular;  Laterality: N/A;  . CARDIAC CATHETERIZATION N/A 12/25/2015   Procedure: Coronary Stent Intervention;  Surgeon: Alwyn Pea, MD;  Location: [facility] INVASIVE CV LAB;  Service: Cardiovascular;  Laterality: N/A;  . CARDIAC CATHETERIZATION N/A 03/10/2016   Procedure: Left Heart Cath and Coronary Angiography;  Surgeon: Iran Ouch, MD;  Location: [facility] INVASIVE CV LAB;  Service: Cardiovascular;  Laterality: N/A;  . CARDIAC CATHETERIZATION N/A 04/06/2016   Procedure: Left Heart Cath and Coronary Angiography;  Surgeon: Lyn Records, MD;  Location: [facility] INVASIVE CV LAB;  Service: Cardiovascular;  Laterality: N/A;  . CIRCUMCISION       Home Meds: Prior to Admission medications   Medication Sig Start Date End Date Taking? Authorizing Provider  ALPRAZolam Prudy Feeler) 0.25 MG tablet Take 2 tablets (0.5 mg total) by mouth 2 (two) times daily as needed for anxiety. 09/09/16  Yes Amy Michelle Nasuti, MD  aspirin 81 MG EC tablet Take 1 tablet (81 mg total) by mouth daily. 01/12/16  Yes Ok Anis, NP  carvedilol (COREG) 6.25 MG tablet TAKE 1 TABLET BY MOUTH ONCE DAILY 08/09/16  Yes Amy Michelle Nasuti, MD  clopidogrel (PLAVIX) 75 MG tablet Take 1 tablet (75 mg total) by mouth daily with breakfast. 04/07/16  Yes Janetta Hora, PA-C  cyclobenzaprine (FLEXERIL) 10 MG tablet Take 1 tablet (10 mg total) by mouth every 8 (eight) hours as needed for muscle spasms. 07/29/16  Yes Amy Michelle Nasuti, MD  diclofenac (VOLTAREN) 75 MG EC tablet Take 75 mg by mouth 2 (two) times daily.   Yes Historical Provider, MD  furosemide (LASIX) 40 MG tablet Take 20 mg by mouth daily.  08/26/16  Yes Historical Provider, MD  insulin NPH-regular Human (NOVOLIN 70/30 RELION) (70-30) 100 UNIT/ML  injection Inject 80 units every morning and 70 units every evening Patient taking differently: Inject 60-70 Units into the skin 2 (two) times daily. Inject 70 units subcutaneously before breakfast and 60 units before supper 03/28/16  Yes Amy E Bedsole, MD  isosorbide mononitrate (IMDUR) 30 MG 24 hr tablet Take 1 tablet (30 mg total) by mouth 2 (two) times daily. 07/05/16  Yes Antonieta Iba, MD  nitroGLYCERIN (NITROSTAT) 0.4 MG SL tablet Place 1 tablet (0.4 mg total) under the tongue every 5 (five) minutes as needed for chest pain. 07/05/16  Yes Antonieta Iba, MD  pravastatin (PRAVACHOL) 40 MG tablet Take 40 mg by mouth daily.   Yes Historical Provider, MD  PRESCRIPTION MEDICATION Eye drops - administer day before and day after eye injections or laser treatments - next office visit mid December 2017   Yes Historical Provider, MD  ranitidine (  ZANTAC) 150 MG tablet Take 150 mg by mouth 2 (two) times daily as needed for heartburn.   Yes Historical Provider, MD  ONE TOUCH ULTRA TEST test strip  07/26/16   Historical Provider, MD    Inpatient Medications:  . aspirin EC  81 mg Oral Daily  . carvedilol  6.25 mg Oral BID WC  . [START ON 09/21/2016] clopidogrel  75 mg Oral Q breakfast  . diclofenac  75 mg Oral BID  . enoxaparin (LOVENOX) injection  40 mg Subcutaneous Q24H  . ezetimibe  10 mg Oral Daily  . furosemide  40 mg Intravenous Q12H  . insulin aspart  0-9 Units Subcutaneous TID WC  . insulin aspart protamine- aspart  50 Units Subcutaneous BID WC  . isosorbide mononitrate  30 mg Oral BID  . pravastatin  40 mg Oral Daily  . sodium chloride flush  3 mL Intravenous Q12H     Allergies:  Allergies  Allergen Reactions  . Atorvastatin Other (See Comments)    Body aches    Social History   Social History  . Marital status: Widowed    Spouse name: N/A  . Number of children: N/A  . Years of education: N/A   Occupational History  . disabled Unemployed    back injury   Social History  Main Topics  . Smoking status: Never Smoker  . Smokeless tobacco: Never Used  . Alcohol use No  . Drug use: No  . Sexual activity: Not on file   Other Topics Concern  . Not on file   Social History Narrative   Financial concerns, wife with depression.   Minimal exercise.   Diet: poor.     Family History  Problem Relation Age of Onset  . Alzheimer's disease Mother   . Emphysema Mother   . Diabetes Father   . Heart disease Father     MI  . Cancer Brother     ? Neck cancer     Review of Systems: Review of Systems  Constitutional: Positive for malaise/fatigue. Negative for chills, diaphoresis, fever and weight loss.  HENT: Negative for congestion.   Eyes: Negative for discharge and redness.  Respiratory: Positive for shortness of breath. Negative for cough, hemoptysis, sputum production and wheezing.   Cardiovascular: Positive for chest pain, orthopnea and leg swelling. Negative for palpitations, claudication and PND.  Gastrointestinal: Negative for abdominal pain, blood in stool, heartburn, melena, nausea and vomiting.  Genitourinary: Negative for hematuria.  Musculoskeletal: Negative for falls and myalgias.  Skin: Negative for rash.  Neurological: Positive for weakness. Negative for dizziness, tingling, tremors, sensory change, speech change, focal weakness and loss of consciousness.  Endo/Heme/Allergies: Does not bruise/bleed easily.  Psychiatric/Behavioral: Negative for substance abuse. The patient is not nervous/anxious.   All other systems reviewed and are negative.   Labs:  Recent Labs  09/20/16 0507 09/20/16 0811 09/20/16 1155  TROPONINI 0.07* 0.08* 0.07*   Lab Results  Component Value Date   WBC 6.0 09/20/2016   HGB 12.8 (L) 09/20/2016   HCT 38.7 (L) 09/20/2016   MCV 88.3 09/20/2016   PLT 219 09/20/2016     Recent Labs Lab 09/20/16 0507 09/20/16 1049  NA 142  --   K 3.9  --   CL 104  --   CO2 30  --   BUN 19  --   CREATININE 1.00 1.01    CALCIUM 8.9  --   GLUCOSE 158*  --    Lab Results  Component Value  Date   CHOL 197 02/16/2016   HDL 33.20 (L) 02/16/2016   LDLCALC 139 (H) 02/16/2016   TRIG 122.0 02/16/2016     Radiology/Studies:  Dg Chest 1 View  Result Date: 09/20/2016 CLINICAL DATA:  Initial evaluation for acute chest pain, shortness of breath. EXAM: CHEST 1 VIEW COMPARISON:  Prior radiograph from 07/03/2016. FINDINGS: Cardiomegaly, similar to previous. Mediastinal silhouette within normal limits. Lungs normally inflated. Diffuse pulmonary vascular congestion with interstitial prominence, compatible with pulmonary interstitial edema. No focal infiltrates. The no definite pleural effusion. No pneumothorax. No acute osseous abnormality. IMPRESSION: Cardiomegaly with diffuse pulmonary vascular congestion and interstitial prominence, compatible with pulmonary interstitial edema. Electronically Signed   By: Rise Mu M.D.   On: 09/20/2016 06:06    EKG: Interpreted by me showed: NSR, 83 bpm, nonspecific st/t changes Telemetry: Interpreted by me showed: NSR, 60's bpm  Weights: Filed Weights   09/20/16 0446  Weight: (!) 305 lb (138.3 kg)     Physical Exam: Blood pressure (!) 157/77, pulse 74, temperature 97.7 F (36.5 C), temperature source Oral, resp. rate 17, height 5\' 7"  (1.702 m), weight (!) 305 lb (138.3 kg), SpO2 97 %. Body mass index is 47.77 kg/m. General: Well developed, well nourished, in no acute distress. Eating a chicken sandwich and baked potato chips in the ED. Head: Normocephalic, atraumatic, sclera non-icteric, no xanthomas, nares are without discharge.  Neck: Negative for carotid bruits. JVD difficult to assess 2/2 habitus. Lungs: Clear bilaterally to auscultation without wheezes, rales, or rhonchi. Breathing is unlabored. Heart: RRR with S1 S2. No murmurs, rubs, or gallops appreciated. Abdomen: Obese, soft, non-tender, distended with normoactive bowel sounds. No hepatomegaly. No  rebound/guarding. No obvious abdominal masses. Msk:  Strength and tone appear normal for age. Extremities: No clubbing or cyanosis. 2+ pitting edema. Distal pedal pulses are 2+ and equal bilaterally. Neuro: Alert and oriented X 3. No facial asymmetry. No focal deficit. Moves all extremities spontaneously. Psych:  Responds to questions appropriately with a normal affect.    Assessment and Plan:  Principal Problem:   Acute on chronic diastolic CHF (congestive heart failure) (HCC) Active Problems:   Noncompliance with diet and medication regimen   Hypertensive heart disease   Uncontrolled diabetes mellitus with retinopathy (HCC)   Chronic chest pain   CAD in native artery   Hyperlipidemia LDL goal <70   Morbid obesity (HCC)    1. Acute on chronic diastolic CHF:  -Diuresing well on IV Lasix ordered by IM, continue with KCl repletion  -Recent echo 02/2016, no need to repeat at this time  -Exacerbation likely in the setting of significant dietary indiscretions and poorly controlled hypertension  -Continue Coreg  -Daily weights and strict I&O  -Would benefit from nutrition consult to educate patient on heart healthy diet and fluids restriction, defer consult to IM   2. Hypertensive heart disease:  -Poorly controlled  -Improving  -Lasix and Coreg as above  -Add hydralazine   3. Elevated troponin/CAD as above:  -Cardiac cath x 3 within the past 12 months  -Elevated troponin likely supply demand ischemia in the setting of his volume overload and CTO of the RCA with malignant hypertension  -No plans for inpatient ischemia eavluation  -Consider outpatient stress testing  -Continue DAPT with ASA and Plavix  -Change pravastatin to Lipitor for secondary prevention   4. IDDM:  -Per IM   5. HLD:  -As above   6. Deconditioning:  -Would benefit from pulmonary rehab  7. Dietary indiscretions: -Would benefit  from nutritionist consult as above -Will defer this consult to IM  8. Acute  stress reaction/grieving: -Sadly, his wife passed away 3 weeks ago -Per IM   Signed, Eula Listen, PA-C [facility] Pager: [phone] 09/20/2016, 1:11 PM

## 2016-09-20 NOTE — ED Triage Notes (Signed)
Pt presents to ED via wheelchair with c/o chest pain and short of breath since yesterday. Generalized edema noted, (+) 2 noted to bilateral legs. Pt speaking in complete sentences, respirations even and unlabored. Skin warm and dry.

## 2016-09-21 ENCOUNTER — Telehealth: Payer: Self-pay | Admitting: Cardiovascular Disease

## 2016-09-21 ENCOUNTER — Other Ambulatory Visit: Payer: Self-pay | Admitting: Family Medicine

## 2016-09-21 ENCOUNTER — Inpatient Hospital Stay: Payer: PPO

## 2016-09-21 DIAGNOSIS — R748 Abnormal levels of other serum enzymes: Secondary | ICD-10-CM

## 2016-09-21 LAB — CBC
HCT: 37.3 % — ABNORMAL LOW (ref 40.0–52.0)
Hemoglobin: 12.6 g/dL — ABNORMAL LOW (ref 13.0–18.0)
MCH: 29.7 pg (ref 26.0–34.0)
MCHC: 33.7 g/dL (ref 32.0–36.0)
MCV: 88 fL (ref 80.0–100.0)
Platelets: 203 10*3/uL (ref 150–440)
RBC: 4.23 MIL/uL — ABNORMAL LOW (ref 4.40–5.90)
RDW: 15.7 % — ABNORMAL HIGH (ref 11.5–14.5)
WBC: 5.8 10*3/uL (ref 3.8–10.6)

## 2016-09-21 LAB — BASIC METABOLIC PANEL
Anion gap: 6 (ref 5–15)
BUN: 24 mg/dL — ABNORMAL HIGH (ref 6–20)
CO2: 33 mmol/L — ABNORMAL HIGH (ref 22–32)
Calcium: 8.8 mg/dL — ABNORMAL LOW (ref 8.9–10.3)
Chloride: 103 mmol/L (ref 101–111)
Creatinine, Ser: 1.09 mg/dL (ref 0.61–1.24)
GFR calc Af Amer: >60 mL/min (ref >60)
GFR calc non Af Amer: >60 mL/min (ref >60)
Glucose, Bld: 141 mg/dL — ABNORMAL HIGH (ref 65–99)
Potassium: 3.6 mmol/L (ref 3.5–5.1)
Sodium: 142 mmol/L (ref 135–145)

## 2016-09-21 LAB — GLUCOSE, CAPILLARY
Glucose-Capillary: 110 mg/dL — ABNORMAL HIGH (ref 65–99)
Glucose-Capillary: 156 mg/dL — ABNORMAL HIGH (ref 65–99)

## 2016-09-21 MED ORDER — DICLOFENAC SODIUM 75 MG PO TBEC: 75.0000 mg | DELAYED_RELEASE_TABLET | Freq: Two times a day (BID) | ORAL | Status: DC | PRN

## 2016-09-21 MED ORDER — FUROSEMIDE 20 MG PO TABS: 20.0000 mg | ORAL_TABLET | Freq: Two times a day (BID) | ORAL | Status: DC

## 2016-09-21 MED ORDER — ENOXAPARIN SODIUM 40 MG/0.4ML ~~LOC~~ SOLN: 40.0000 mg | Freq: Two times a day (BID) | SUBCUTANEOUS | Status: DC

## 2016-09-21 MED ORDER — ALPRAZOLAM 0.5 MG PO TABS: 0.5000 mg | ORAL_TABLET | Freq: Two times a day (BID) | ORAL | 0 refills | Status: DC | PRN

## 2016-09-21 NOTE — Care Management Important Message (Signed)
Important Message  Patient Details  Name: William HuskJohn H [name] MRN: [id number] Date of Birth: [birth year]   Medicare Important Message Given:  No  Patient was discharged before CM could give him a signed copy of his IM notice   Eber HongGreene, Lakethia Coppess R, RN 09/21/2016, 3:44 PM

## 2016-09-21 NOTE — Telephone Encounter (Signed)
Last office visit 09/09/2016.  Potassium Chloride is not on current medication list. Patient is currently admitted to the hospital.  Refill?

## 2016-09-21 NOTE — Discharge Instructions (Signed)
Heart Failure Clinic appointment on September 29, 2016 at 11:30am with Clarisa Kindredina Hackney, FNP. Please call [phone] to reschedule.   Fluid restriction up to 1200 ml daily Low salt diet. Daily weigh your self, if > 2 Lb gain in a day or > 5 Lb in 1 week- take lasix 20 mg 2 times a day for 2-3 days- and still not able to get rid of extra weight- call your doctor or cardiologist.

## 2016-09-21 NOTE — Progress Notes (Signed)
Initial Heart Failure Clinic appointment scheduled on September 29, 2016 at 11:30am. Thank you.

## 2016-09-21 NOTE — Discharge Summary (Signed)
[facility] [place] at [facility]   PATIENT NAME: [name]    MR#:  [id number]  DATE OF BIRTH:  [birth year]  DATE OF ADMISSION:  09/20/2016 ADMITTING PHYSICIAN: Altamese Dilling, MD  DATE OF DISCHARGE: 09/21/2016  PRIMARY CARE PHYSICIAN: Kerby Nora, MD    ADMISSION DIAGNOSIS:  CHF (congestive heart failure) (HCC) [I50.9] Acute pulmonary edema (HCC) [J81.0] Acute on chronic congestive heart failure, unspecified congestive heart failure type (HCC) [I50.9] Dyspnea, unspecified type [R06.00]  DISCHARGE DIAGNOSIS:  Principal Problem:   Acute on chronic diastolic CHF (congestive heart failure) (HCC) Active Problems:   Uncontrolled diabetes mellitus with retinopathy (HCC)   Hyperlipidemia LDL goal <70   Noncompliance with diet and medication regimen   Chronic chest pain   Hypertensive heart disease with heart failure (HCC)   CAD in native artery   Morbid obesity (HCC)   Acute pulmonary edema (HCC)   SECONDARY DIAGNOSIS:   Past Medical History:  Diagnosis Date  . Back injury 02/2002   worker's comp  . CHF (congestive heart failure) (HCC)   . Coronary artery disease, non-occlusive    a. cath 2002 with no sig CAD;  b. cath 2008 normal LM, LAD, LCx, p&dRCA 20-30%, PDA 30%; c.11/2015 NSTEMI/PCI: LM nl, LAD 95p (2.5x15 Xience DES), LCX nl, RCA 100p/m w/ L->R collats, EF 55-65% c. NSTEMI (02/2016) with no culprit leision, switched to Brilinta.  d. NSTEMI 03/2016: again, no culprit lesion and switched back to plavix 2/2 SOB with Brilnta.    . Depression   . Diabetes mellitus type 2, insulin dependent (HCC)   . Hyperlipemia   . Hypertensive heart disease   . Kidney stones   . Morbid obesity (HCC)   . Osteoarthritis   . Snoring     HOSPITAL COURSE:   * Ac on Ch diastolic CHF   Recent Echo, no need to repeat.   I councelled about fluid restriction. Daily weights   I/o monitor.   IV lasix, Tele, troponin monitor.   Consult cardio appreciated.    He had good diuresis with IV lasix, feels much better now.   D/c home with follow up in CHF clinic and in Cardio clinic.  * DM   Ins 70/30 - 50 U BID   ISS.  * Htn   Coreg, lasix  * Hyperlipidemia   Statin.  * CAD   Cont home meds.    DISCHARGE CONDITIONS:   Stable.  CONSULTS OBTAINED:  Treatment Team:  Yvonne Kendall, MD  DRUG ALLERGIES:   Allergies  Allergen Reactions  . Atorvastatin Other (See Comments)    Body aches    DISCHARGE MEDICATIONS:   Current Discharge Medication List    CONTINUE these medications which have CHANGED   Details  ALPRAZolam (XANAX) 0.5 MG tablet Take 1 tablet (0.5 mg total) by mouth 2 (two) times daily as needed for anxiety. Qty: 30 tablet, Refills: 0      CONTINUE these medications which have NOT CHANGED   Details  aspirin 81 MG EC tablet Take 1 tablet (81 mg total) by mouth daily.    carvedilol (COREG) 6.25 MG tablet TAKE 1 TABLET BY MOUTH ONCE DAILY Qty: 90 tablet, Refills: 1    clopidogrel (PLAVIX) 75 MG tablet Take 1 tablet (75 mg total) by mouth daily with breakfast. Qty: 90 tablet, Refills: 3    cyclobenzaprine (FLEXERIL) 10 MG tablet Take 1 tablet (10 mg total) by mouth every 8 (eight) hours as needed for muscle spasms. Qty:  30 tablet, Refills: 1    diclofenac (VOLTAREN) 75 MG EC tablet Take 75 mg by mouth 2 (two) times daily.    furosemide (LASIX) 40 MG tablet Take 20 mg by mouth daily.     insulin NPH-regular Human (NOVOLIN 70/30 RELION) (70-30) 100 UNIT/ML injection Inject 80 units every morning and 70 units every evening Qty: 4 vial, Refills: 11    isosorbide mononitrate (IMDUR) 30 MG 24 hr tablet Take 1 tablet (30 mg total) by mouth 2 (two) times daily. Qty: 180 tablet, Refills: 3    nitroGLYCERIN (NITROSTAT) 0.4 MG SL tablet Place 1 tablet (0.4 mg total) under the tongue every 5 (five) minutes as needed for chest pain. Qty: 25 tablet, Refills: 6    pravastatin (PRAVACHOL) 40 MG tablet Take 40 mg by  mouth daily.    PRESCRIPTION MEDICATION Eye drops - administer day before and day after eye injections or laser treatments - next office visit mid December 2017    ranitidine (ZANTAC) 150 MG tablet Take 150 mg by mouth 2 (two) times daily as needed for heartburn.    ONE TOUCH ULTRA TEST test strip       STOP taking these medications     ezetimibe (ZETIA) 10 MG tablet          DISCHARGE INSTRUCTIONS:    Follow with CHF clinic and Cardio clinic.  Fluid restriction up to 1200 ml daily Low salt diet. Daily weigh your self, if > 2 Lb gain in a day or > 5 Lb in 1 week- take lasix 20 mg 2 times a day for 2 days- and still not able to get rid of extra weight- call your doctor or cardiologist.    If you experience worsening of your admission symptoms, develop shortness of breath, life threatening emergency, suicidal or homicidal thoughts you must seek medical attention immediately by calling 911 or calling your MD immediately  if symptoms less severe.  You Must read complete instructions/literature along with all the possible adverse reactions/side effects for all the Medicines you take and that have been prescribed to you. Take any new Medicines after you have completely understood and accept all the possible adverse reactions/side effects.   Please note  You were cared for by a hospitalist during your hospital stay. If you have any questions about your discharge medications or the care you received while you were in the hospital after you are discharged, you can call the unit and asked to speak with the hospitalist on call if the hospitalist that took care of you is not available. Once you are discharged, your primary care physician will handle any further medical issues. Please note that NO REFILLS for any discharge medications will be authorized once you are discharged, as it is imperative that you return to your primary care physician (or establish a relationship with a primary care  physician if you do not have one) for your aftercare needs so that they can reassess your need for medications and monitor your lab values.    Today   CHIEF COMPLAINT:   Chief Complaint  Patient presents with  . Chest Pain  . Shortness of Breath    HISTORY OF PRESENT ILLNESS:  [name]  is a 66 y.o. male with a known history of CAD, CHF, Htn, DM, obesity- his wife died 3 weeks ago. He drinks a lot of liquid daily. Claims " NOBODY TOLD ME ABOUT THIS BEFORE" drinks a gallon of green tea in a day and  2-3 bottles of diet coke. He was advised to take lasix tab- when have swelling on legs, taking it- but still very SOB with walking just 10 steps and can not sleep at night in bed for last 2 days. At baseline- he hae very poor functional status- can not walk from parking lot to the store or in hospital. Noted to have CHF exacerbation.   VITAL SIGNS:  Blood pressure (!) 105/55, pulse 66, temperature 97.7 F (36.5 C), temperature source Oral, resp. rate 10, height 5\' 7"  (1.702 m), weight (!) 136.6 kg (301 lb 3.2 oz), SpO2 93 %.  I/O:   Intake/Output Summary (Last 24 hours) at 09/21/16 1357 Last data filed at 09/21/16 1047  Gross per 24 hour  Intake              240 ml  Output             1150 ml  Net             -910 ml    PHYSICAL EXAMINATION:  GENERAL:  66 y.o.-year-old patient lying in the bed with no acute distress.  EYES: Pupils equal, round, reactive to light and accommodation. No scleral icterus. Extraocular muscles intact.  HEENT: Head atraumatic, normocephalic. Oropharynx and nasopharynx clear.  NECK:  Supple, no jugular venous distention. No thyroid enlargement, no tenderness.  LUNGS: Normal breath sounds bilaterally, no wheezing, rales,rhonchi or crepitation. No use of accessory muscles of respiration.  CARDIOVASCULAR: S1, S2 normal. No murmurs, rubs, or gallops.  ABDOMEN: Soft, non-tender, non-distended. Bowel sounds present. No organomegaly or mass.  EXTREMITIES: some  pedal edema,no cyanosis, or clubbing.  NEUROLOGIC: Cranial nerves II through XII are intact. Muscle strength 5/5 in all extremities. Sensation intact. Gait not checked.  PSYCHIATRIC: The patient is alert and oriented x 3.  SKIN: No obvious rash, lesion, or ulcer.   DATA REVIEW:   CBC  Recent Labs Lab 09/21/16 0502  WBC 5.8  HGB 12.6*  HCT 37.3*  PLT 203    Chemistries   Recent Labs Lab 09/21/16 0502  NA 142  K 3.6  CL 103  CO2 33*  GLUCOSE 141*  BUN 24*  CREATININE 1.09  CALCIUM 8.8*    Cardiac Enzymes  Recent Labs Lab 09/20/16 1828  TROPONINI 0.07*    Microbiology Results  No results found for this or any previous visit.  RADIOLOGY:  Dg Chest 1 View  Result Date: 09/20/2016 CLINICAL DATA:  Initial evaluation for acute chest pain, shortness of breath. EXAM: CHEST 1 VIEW COMPARISON:  Prior radiograph from 07/03/2016. FINDINGS: Cardiomegaly, similar to previous. Mediastinal silhouette within normal limits. Lungs normally inflated. Diffuse pulmonary vascular congestion with interstitial prominence, compatible with pulmonary interstitial edema. No focal infiltrates. The no definite pleural effusion. No pneumothorax. No acute osseous abnormality. IMPRESSION: Cardiomegaly with diffuse pulmonary vascular congestion and interstitial prominence, compatible with pulmonary interstitial edema. Electronically Signed   By: Rise Mu M.D.   On: 09/20/2016 06:06   Dg Chest 2 View  Result Date: 09/21/2016 CLINICAL DATA:  66 year old male with CHF. EXAM: CHEST  2 VIEW COMPARISON:  Chest radiograph dated 09/20/2016 FINDINGS: Overall there has been slight interval improvement of the previously seen vascular congestion and pulmonary edema. An area of slight increased density in the lingula likely vascular crowding. Developing pneumonia is less likely but not excluded. Clinical correlation is recommended. No definite focal consolidation. There is no pleural effusion or  pneumothorax. Stable mild cardiomegaly. No acute osseous pathology. IMPRESSION: Slight interval  improvement of the congestive changes seen on the radiograph of 09/20/2016. Electronically Signed   By: Elgie CollardArash  Radparvar M.D.   On: 09/21/2016 05:19    EKG:   Orders placed or performed during the hospital encounter of 09/20/16  . EKG 12-Lead  . EKG 12-Lead  . ED EKG within 10 minutes  . ED EKG within 10 minutes      Management plans discussed with the patient, family and they are in agreement.  CODE STATUS:     Code Status Orders        Start     Ordered   09/20/16 0951  Full code  Continuous     09/20/16 0951    Code Status History    Date Active Date Inactive Code Status Order ID Comments User Context   04/06/2016  1:28 AM 04/07/2016  2:36 PM Full Code [id number]  Nevin BloodgoodAmir Azeem, MD Inpatient   03/09/2016 10:34 AM 03/11/2016  3:22 PM Full Code [id number]  Alford Highlandichard Wieting, MD ED   12/25/2015  1:44 PM 12/26/2015  2:08 PM Full Code [id number]  Alwyn Peawayne D Callwood, MD Inpatient   12/24/2015  6:14 PM 12/25/2015  1:44 PM Full Code [id number]  Enedina FinnerSona Patel, MD Inpatient      TOTAL TIME TAKING CARE OF THIS PATIENT: 35 minutes.    Altamese DillingVACHHANI, Kinan Safley M.D on 09/21/2016 at 1:57 PM  Between 7am to 6pm - Pager - [phone]  After 6pm go to [email or web address] - password [email or web address]  [facility]  Office  [phone]  CC: Primary care physician; Kerby NoraAmy Bedsole, MD   Note: This dictation was prepared with Dragon dictation along with smaller phrase technology. Any transcriptional errors that result from this process are unintentional.

## 2016-09-21 NOTE — Telephone Encounter (Signed)
[facility] ph [facility] for CHF  [facility]

## 2016-09-21 NOTE — Progress Notes (Signed)
Patient is alert and oriented, vss, no complaints of pain.  D/C telemetry and D/c PIV.  No questions at this time.  PAtient escorted out of hospital via wheelchair by volunteers.

## 2016-09-21 NOTE — Progress Notes (Signed)
PHARMACIST - PHYSICIAN COMMUNICATION  CONCERNING:  Enoxaparin (Lovenox) for DVT Prophylaxis   RECOMMENDATION: Patient was prescribed enoxaprin 40mg  q24 hours for VTE prophylaxis.   Filed Weights   09/20/16 0446 09/20/16 1826 09/21/16 0515  Weight: (!) 305 lb (138.3 kg) (!) 302 lb 14.6 oz (137.4 kg) (!) 301 lb 3.2 oz (136.6 kg)    Body mass index is 47.17 kg/m.  Estimated Creatinine Clearance: 88.9 mL/min (by C-G formula based on SCr of 1.09 mg/dL).   [facility] patient is candidate for enoxaparin 40mg  every 12 hour dosing due to BMI being >40.  DESCRIPTION: Pharmacy has adjusted enoxaparin dose per [facility] policy.  Patient is now receiving enoxaparin 40mg  every 12 hours.   Cher NakaiSheema Schyler Counsell, PharmD Clinical Pharmacist  09/21/2016 12:14 PM

## 2016-09-21 NOTE — Telephone Encounter (Signed)
[facility].  Patient understands to follow up with Dr. Mariah MillingGollan on 09/30/16 at 2:[facility]. Patient understands discharge instructions? yes Patient understands medications and regiment? yes Patient understands to bring all medications to this visit? yes

## 2016-09-21 NOTE — Care Management (Signed)
CM was intending to assess patient due to diagnosis and he discharge before CM could assess.  there was no consult order

## 2016-09-21 NOTE — Progress Notes (Signed)
Patient Name: [name] Date of Encounter: 09/21/2016  Primary Cardiologist: [facility] Problem List     Principal Problem:   Acute on chronic diastolic CHF (congestive heart failure) (HCC) Active Problems:   Noncompliance with diet and medication regimen   Hypertensive heart disease with heart failure (HCC)   Uncontrolled diabetes mellitus with retinopathy (HCC)   Chronic chest pain   CAD in native artery   Hyperlipidemia LDL goal <70   Morbid obesity (HCC)   Acute pulmonary edema (HCC)     Subjective   Breathing much better this morning. UOP of 2.2 L for the past 24 hours. Labs stable. Wants to go home.   Inpatient Medications    Scheduled Meds: . aspirin EC  81 mg Oral Daily  . carvedilol  6.25 mg Oral BID WC  . clopidogrel  75 mg Oral Q breakfast  . diclofenac  75 mg Oral BID  . enoxaparin (LOVENOX) injection  40 mg Subcutaneous Q24H  . ezetimibe  10 mg Oral Daily  . furosemide  40 mg Intravenous Q12H  . insulin aspart  0-9 Units Subcutaneous TID WC  . insulin aspart protamine- aspart  50 Units Subcutaneous BID WC  . isosorbide mononitrate  30 mg Oral BID  . pravastatin  40 mg Oral Daily  . sodium chloride flush  3 mL Intravenous Q12H   Continuous Infusions:  PRN Meds: ALPRAZolam, alum & mag hydroxide-simeth, cyclobenzaprine, nitroGLYCERIN   Vital Signs    Vitals:   09/20/16 1730 09/20/16 1826 09/20/16 2006 09/21/16 0515  BP: (!) 167/81 (!) 181/70 (!) 141/61 (!) 121/56  Pulse: 74 72 77 63  Resp:  16  15  Temp:  97.9 F (36.6 C) 98.4 F (36.9 C) 97.7 F (36.5 C)  TempSrc:  Oral Oral Oral  SpO2: 97% 99% 100% 99%  Weight:  (!) 302 lb 14.6 oz (137.4 kg)  (!) 301 lb 3.2 oz (136.6 kg)  Height:  5\' 7"  (1.702 m)      Intake/Output Summary (Last 24 hours) at 09/21/16 1100 Last data filed at 09/21/16 1047  Gross per 24 hour  Intake              240 ml  Output             1450 ml  Net            -1210 ml   Filed Weights   09/20/16 0446  09/20/16 1826 09/21/16 0515  Weight: (!) 305 lb (138.3 kg) (!) 302 lb 14.6 oz (137.4 kg) (!) 301 lb 3.2 oz (136.6 kg)    Physical Exam    GEN: Well nourished, well developed, in no acute distress.  HEENT: Grossly normal.  Neck: Supple, JVD improved, no carotid bruits, or masses. Cardiac: RRR, no murmurs, rubs, or gallops. No clubbing or cyanosis. 1+ bilateral pitting edema to the knees.  Radials/DP/PT 2+ and equal bilaterally.  Respiratory:  Respirations regular and unlabored, clear to auscultation bilaterally. GI: Obese, soft, nontender, distended, BS + x 4. MS: no deformity or atrophy. Skin: warm and dry, no rash. Neuro:  Strength and sensation are intact. Psych: AAOx3.  Normal affect.  Labs    CBC  Recent Labs  09/20/16 1049 09/21/16 0502  WBC 6.0 5.8  HGB 12.8* 12.6*  HCT 38.7* 37.3*  MCV 88.3 88.0  PLT 219 203   Basic Metabolic Panel  Recent Labs  09/20/16 0507 09/20/16 1049 09/21/16 0502  NA 142  --  142  K 3.9  --  3.6  CL 104  --  103  CO2 30  --  33*  GLUCOSE 158*  --  141*  BUN 19  --  24*  CREATININE 1.00 1.01 1.09  CALCIUM 8.9  --  8.8*   Liver Function Tests No results for input(s): AST, ALT, ALKPHOS, BILITOT, PROT, ALBUMIN in the last 72 hours. No results for input(s): LIPASE, AMYLASE in the last 72 hours. Cardiac Enzymes  Recent Labs  09/20/16 1155 09/20/16 1543 09/20/16 1828  TROPONINI 0.07* 0.08* 0.07*   BNP Invalid input(s): POCBNP D-Dimer No results for input(s): DDIMER in the last 72 hours. Hemoglobin A1C No results for input(s): HGBA1C in the last 72 hours. Fasting Lipid Panel No results for input(s): CHOL, HDL, LDLCALC, TRIG, CHOLHDL, LDLDIRECT in the last 72 hours. Thyroid Function Tests No results for input(s): TSH, T4TOTAL, T3FREE, THYROIDAB in the last 72 hours.  Invalid input(s): FREET3  Telemetry    Sinus rhythm - Personally Reviewed  ECG    n/a - Personally Reviewed  Radiology    Dg Chest 1 View  Result  Date: 09/20/2016 CLINICAL DATA:  Initial evaluation for acute chest pain, shortness of breath. EXAM: CHEST 1 VIEW COMPARISON:  Prior radiograph from 07/03/2016. FINDINGS: Cardiomegaly, similar to previous. Mediastinal silhouette within normal limits. Lungs normally inflated. Diffuse pulmonary vascular congestion with interstitial prominence, compatible with pulmonary interstitial edema. No focal infiltrates. The no definite pleural effusion. No pneumothorax. No acute osseous abnormality. IMPRESSION: Cardiomegaly with diffuse pulmonary vascular congestion and interstitial prominence, compatible with pulmonary interstitial edema. Electronically Signed   By: Rise Mu M.D.   On: 09/20/2016 06:06   Dg Chest 2 View  Result Date: 09/21/2016 CLINICAL DATA:  66 year old male with CHF. EXAM: CHEST  2 VIEW COMPARISON:  Chest radiograph dated 09/20/2016 FINDINGS: Overall there has been slight interval improvement of the previously seen vascular congestion and pulmonary edema. An area of slight increased density in the lingula likely vascular crowding. Developing pneumonia is less likely but not excluded. Clinical correlation is recommended. No definite focal consolidation. There is no pleural effusion or pneumothorax. Stable mild cardiomegaly. No acute osseous pathology. IMPRESSION: Slight interval improvement of the congestive changes seen on the radiograph of 09/20/2016. Electronically Signed   By: Elgie Collard M.D.   On: 09/21/2016 05:19    Cardiac Studies   LHC 03/2016: Conclusion     Mid RCA to Dist RCA lesion, 100 %stenosed.  Mid LAD-2 lesion, 60 %stenosed.  Mid LAD-1 lesion, 20 %stenosed.  The left ventricular systolic function is normal.  The left ventricular ejection fraction is 50-55% by visual estimate.  LV end diastolic pressure is normal.  2nd [name] lesion, 75 %stenosed.    Chronic total occlusion of the right coronary in the proximal segment well collateralized from the  left circumflex and LAD.  Widely patent proximal to mid LAD stent previously placed in July. There is first diagonal diagonal and LAD 30 and 50% narrowing respectively.  Widely patent circumflex unchanged from previous with 70% narrowing in a small branch of the first marginal. Circumflex collateralizes the distal right coronary left ventricular branch.  Inferobasal hypokinesis. EF 50%. EDP is normal.  Unable to identify a significant change in the angiographic appearance since prior study in July.  RECOMMENDATIONS:   Continuation of dual antiplatelet therapy is stressed.  Further management per treating team.     Patient Profile     66 y.o. male with history of CAD with  NSTEMI in 11/2015 with PCI to LAD with recurrent NSTEMI x2 in 02/2016 and 03/2016 medically managed, chronic diastolic CHF, morbid obesity, HTN, DM, chronic fatigue, and deconditioning who [facility] with increased SOB found to be volume overloaded in the setting of dietary noncompliance.   Assessment & Plan    1. Acute on chronic diastolic CHF:  -Much improved -Diuresing well on IV Lasix ordered by IM -Could transition back to PO home regimen of 20 mg daily with an extra dose prn weight gain or SOB with KCl repletion -Will increase home dose of Lasix to 20 mg bid from daily -Recent echo 02/2016, no need to repeat at this time  -Exacerbation likely in the setting of significant dietary indiscretions and poorly controlled hypertension  -Continue Coreg  -Daily weights and strict I&O  -Would benefit from nutrition consult to educate patient on heart healthy diet and fluids restriction, defer consult to IM   2. Hypertensive heart disease:  -Improved -Lasix and Coreg as above   3. Elevated troponin/CAD as above:  -Cardiac cath x 3 within the past 12 months  -Elevated troponin likely supply demand ischemia in the setting of his volume overload and CTO of the RCA with malignant hypertension  -No plans  for inpatient ischemia eavluation  -Consider outpatient stress testing  -Continue DAPT with ASA and Plavix  -Unable to change pravastatin to Lipitor 2/2 intolerance   4. IDDM:  -Per IM   5. HLD:  -As above   6. Deconditioning:  -Would benefit from pulmonary rehab  7. Dietary indiscretions: -Would benefit from nutritionist consult as above -Will defer this consult to IM  8. Acute stress reaction/grieving: -Sadly, his wife passed away 3 weeks ago -Per IM   Signed, Eula Listenyan Krishon Adkison, PA-C [facility] Pager: [phone] 09/21/2016, 11:00 AM

## 2016-09-22 NOTE — Telephone Encounter (Signed)
Await discharge recommendation.

## 2016-09-24 ENCOUNTER — Other Ambulatory Visit: Payer: Self-pay | Admitting: Family Medicine

## 2016-09-24 NOTE — Telephone Encounter (Signed)
Not listed on his discharge medication list.  Refill?

## 2016-09-29 ENCOUNTER — Ambulatory Visit: Payer: PPO | Admitting: Family

## 2016-09-30 ENCOUNTER — Ambulatory Visit (INDEPENDENT_AMBULATORY_CARE_PROVIDER_SITE_OTHER): Payer: PPO | Admitting: Cardiovascular Disease

## 2016-09-30 VITALS — BP 158/94 | HR 79 | Ht 66.0 in | Wt 301.5 lb

## 2016-09-30 DIAGNOSIS — IMO0002 Reserved for concepts with insufficient information to code with codable children: Secondary | ICD-10-CM

## 2016-09-30 DIAGNOSIS — I10 Essential (primary) hypertension: Secondary | ICD-10-CM

## 2016-09-30 DIAGNOSIS — E1165 Type 2 diabetes mellitus with hyperglycemia: Secondary | ICD-10-CM

## 2016-09-30 DIAGNOSIS — I5033 Acute on chronic diastolic (congestive) heart failure: Secondary | ICD-10-CM

## 2016-09-30 DIAGNOSIS — I2510 Atherosclerotic heart disease of native coronary artery without angina pectoris: Secondary | ICD-10-CM

## 2016-09-30 DIAGNOSIS — I214 Non-ST elevation (NSTEMI) myocardial infarction: Secondary | ICD-10-CM | POA: Diagnosis not present

## 2016-09-30 DIAGNOSIS — E1159 Type 2 diabetes mellitus with other circulatory complications: Secondary | ICD-10-CM

## 2016-09-30 DIAGNOSIS — Z794 Long term (current) use of insulin: Secondary | ICD-10-CM

## 2016-09-30 MED ORDER — LOSARTAN POTASSIUM 50 MG PO TABS: 50.0000 mg | ORAL_TABLET | Freq: Every day | ORAL | 3 refills | Status: DC

## 2016-09-30 MED ORDER — FUROSEMIDE 40 MG PO TABS
40.0000 mg | ORAL_TABLET | Freq: Two times a day (BID) | ORAL | 11 refills | Status: DC
Start: 2016-09-30 — End: 2017-11-21

## 2016-09-30 NOTE — Progress Notes (Signed)
Cardiology Office Note  Date:  09/30/2016   ID:  [name], DOB 1951-03-18, MRN [id number]  PCP:  Kerby Nora, MD   Chief Complaint  Patient presents with  . other     [facility] ED-to-Hospital F/U tcm for CHF. Pt states his wife just died, but has been doing well since hospital. Reviewed meds with pt verbally.    HPI:  William Choi Atkinsis a 65 y.o.malewith a history ofmorbid obesity, HTN, DM, CAD manifested as NSTEMI in 11/2015 (PCI to LAD; was discharged on Plavix) and again 02/2016 (no PCI; was discharged on Brilinta), Again presented to [facility] ER on 04/05/16 for NSTEMI (no PCI),  catheterization 04/06/2016, stable LAD stent, occluded RCA with collaterals Who presents for follow-up of his coronary artery disease Chronic total occlusion of RCA Preserved ejection fraction  Taking lasix 40 mg daily Weight at home 301 Watching his fluids,  Trying not to drink  GERD gone, fullness gone Currently taking isosorbide 30 mg twice a day Still grieving over the loss of his wife  hospital admission with discharge on 09/21/2016 for acute pulmonary edema, acute on chronic CHF Significant dietary noncompliance, drinks a lot of liquid Was drinking melena green tea per day, bottles of diet Coke Presented to the hospital with shortness of breath and weight gain Poor functional status at baseline Was discharged on only Lasix 40 mg daily  Frequent trips to the emergency room for chest pain relieved with nitroglycerin, none since he has left the hospital 09/21/2016  Sugars previously running high  EKG on today's visit shows normal sinus rhythm with rate 79 bpm, no significant ST or T-wave changes   Other past medical history reviewed 07/03/2016 reports that even relatively active, mowing all day on zero turn mower, Then lifted a heavy toilet for the church Cutback into his truck, developed acute onset chest pain Took nitroglycerin, aspirin Went to the ER 07/03/16 Symptoms resolved on the way EKG  unchanged, "was not like a heart attack" BP went up in the setting of pain  Continues to have chronic fatigue Mild snoring, not bad Will sleep for 3 hours, then wakes up, sits in the living room for 40 minutes then goes back to bed  Previously had problems on brilinta, this caused shortness of breath  EKG on today's visit shows normal sinus rhythm with rate 78 bpm, first-degree AV block, old inferior MI  Other past medical history reviewed Cath 04/06/16 EF 50%  Chronic total occlusion of the right coronary in the proximal segment well collateralized from the left circumflex and LAD.  Widely patent proximal to mid LAD stent previously placed in July. There is first diagonal diagonal and LAD 30 and 50% narrowing respectively.  Widely patent circumflex unchanged from previous with 70% narrowing in a small branch of the first marginal. Circumflex collateralizes the distal right coronary left ventricular branch.  Inferobasal hypokinesis. EF 50%. EDP is normal.   2002 diagnostic catheterization revealing no significant CAD.  catheterization 2008 again showed nonobstructive CAD.   admitted to [place] [facility] in late April 2017 with a two-month history of progressive exertional chest discomfort. ruled in for non-STEMI and subsequent underwent catheterization revealing a chronic total occlusion of the right coronary artery with severe proximal LAD stenosis. The LAD was felt to be the culprit vessel and this was successfully stented using a drug-eluting stent. He was placed on ASA and plavix.  admitted again in 02/2016 for NSTEMI but no culprit found on cath 03/10/16. Felt to be possibly  demand ischemia in the setting of the patient's chronically occluded RCA with underlying hypertensive heart disease and moderately elevated LVEDP. He was switched to Brilinta.   re-presented to [facility] on 04/05/16 with recurrent chest pain.    PMH:   has a past medical history of Back injury (02/2002);  CHF (congestive heart failure) (HCC); Coronary artery disease, non-occlusive; Depression; Diabetes mellitus type 2, insulin dependent (HCC); Hyperlipemia; Hypertensive heart disease; Kidney stones; Morbid obesity (HCC); Osteoarthritis; and Snoring.  PSH:    Past Surgical History:  Procedure Laterality Date  . CARDIAC CATHETERIZATION  09/2000   diffuse LAD 30% LCA  EF 50-60%  . CARDIAC CATHETERIZATION  06/2007   no significant CAD  . CARDIAC CATHETERIZATION N/A 12/25/2015   Procedure: Left Heart Cath and Coronary Angiography;  Surgeon: Antonieta Iba, MD;  Location: [facility] INVASIVE CV LAB;  Service: Cardiovascular;  Laterality: N/A;  . CARDIAC CATHETERIZATION N/A 12/25/2015   Procedure: Coronary Stent Intervention;  Surgeon: Alwyn Pea, MD;  Location: [facility] INVASIVE CV LAB;  Service: Cardiovascular;  Laterality: N/A;  . CARDIAC CATHETERIZATION N/A 03/10/2016   Procedure: Left Heart Cath and Coronary Angiography;  Surgeon: Iran Ouch, MD;  Location: [facility] INVASIVE CV LAB;  Service: Cardiovascular;  Laterality: N/A;  . CARDIAC CATHETERIZATION N/A 04/06/2016   Procedure: Left Heart Cath and Coronary Angiography;  Surgeon: Lyn Records, MD;  Location: [facility] INVASIVE CV LAB;  Service: Cardiovascular;  Laterality: N/A;  . CIRCUMCISION      Current Outpatient Prescriptions  Medication Sig Dispense Refill  . ALPRAZolam (XANAX) 0.5 MG tablet Take 1 tablet (0.5 mg total) by mouth 2 (two) times daily as needed for anxiety. 30 tablet 0  . aspirin 81 MG EC tablet Take 1 tablet (81 mg total) by mouth daily.    . carvedilol (COREG) 6.25 MG tablet TAKE 1 TABLET BY MOUTH ONCE DAILY 90 tablet 1  . clopidogrel (PLAVIX) 75 MG tablet Take 1 tablet (75 mg total) by mouth daily with breakfast. 90 tablet 3  . cyclobenzaprine (FLEXERIL) 10 MG tablet Take 1 tablet (10 mg total) by mouth every 8 (eight) hours as needed for muscle spasms. 30 tablet 1  . diclofenac (VOLTAREN) 75 MG EC tablet Take 75 mg by mouth 2  (two) times daily.    . furosemide (LASIX) 40 MG tablet Take 40 mg by mouth.    . insulin NPH-regular Human (NOVOLIN 70/30 RELION) (70-30) 100 UNIT/ML injection Inject 80 units every morning and 70 units every evening (Patient taking differently: Inject 60-70 Units into the skin 2 (two) times daily. Inject 70 units subcutaneously before breakfast and 60 units before supper) 4 vial 11  . isosorbide mononitrate (IMDUR) 30 MG 24 hr tablet Take 1 tablet (30 mg total) by mouth 2 (two) times daily. 180 tablet 3  . nitroGLYCERIN (NITROSTAT) 0.4 MG SL tablet Place 1 tablet (0.4 mg total) under the tongue every 5 (five) minutes as needed for chest pain. 25 tablet 6  . ONE TOUCH ULTRA TEST test strip     . potassium chloride SA (K-DUR,KLOR-CON) 20 MEQ tablet Take 20 mEq by mouth once.    . pravastatin (PRAVACHOL) 40 MG tablet Take 40 mg by mouth daily.    Marland Kitchen PRESCRIPTION MEDICATION Eye drops - administer day before and day after eye injections or laser treatments - next office visit mid December 2017    . ranitidine (ZANTAC) 150 MG tablet Take 150 mg by mouth 2 (two) times daily as needed for heartburn.  No current facility-administered medications for this visit.      Allergies:   Atorvastatin   Social History:  The patient  reports that he has never smoked. He has never used smokeless tobacco. He reports that he does not drink alcohol or use drugs.   Family History:   family history includes Alzheimer's disease in his mother; Cancer in his brother; Diabetes in his father; Emphysema in his mother; Heart disease in his father.    Review of Systems: Review of Systems  Constitutional: Negative.   Respiratory: Negative.   Cardiovascular: Negative.   Gastrointestinal: Negative.   Musculoskeletal: Negative.   Neurological: Negative.   Psychiatric/Behavioral: Negative.   All other systems reviewed and are negative.    PHYSICAL EXAM: VS:  BP (!) 158/94 (BP Location: Left Arm, Patient Position:  Sitting, Cuff Size: Normal)   Pulse 79   Ht 5\' 6"  (1.676 m)   Wt (!) 301 lb 8 oz (136.8 kg)   BMI 48.66 kg/m  , BMI Body mass index is 48.66 kg/m. GEN: Well nourished, well developed, in no acute distress  HEENT: normal  Neck: no JVD, carotid bruits, or masses Cardiac: RRR; no murmurs, rubs, or gallops,no edema  Respiratory:  clear to auscultation bilaterally, normal work of breathing GI: soft, nontender, nondistended, + BS MS: no deformity or atrophy  Skin: warm and dry, no rash Neuro:  Strength and sensation are intact Psych: euthymic mood, full affect    Recent Labs: 03/09/2016: ALT 24 09/20/2016: B Natriuretic Peptide 249.0 09/21/2016: BUN 24; Creatinine, Ser 1.09; Hemoglobin 12.6; Platelets 203; Potassium 3.6; Sodium 142    Lipid Panel Lab Results  Component Value Date   CHOL 197 02/16/2016   HDL 33.20 (L) 02/16/2016   LDLCALC 139 (H) 02/16/2016   TRIG 122.0 02/16/2016      Wt Readings from Last 3 Encounters:  09/30/16 (!) 301 lb 8 oz (136.8 kg)  09/21/16 (!) 301 lb 3.2 oz (136.6 kg)  09/09/16 (!) 308 lb (139.7 kg)       ASSESSMENT AND PLAN:  Acute on chronic diastolic CHF (congestive heart failure) (HCC) - Plan: EKG 12-Lead Recent hospital records reviewed with him in detail Weight is stable on his current medication regiment He has changed his diet, decreased his fluid intake Recommended he take extra Lasix after lunch for weight more than 304 pounds Otherwise stay on Lasix 40 mg daily Recommended he follow-up with CHF clinic in one or 2 months to establish care  Coronary artery disease, non-occlusive - Plan: EKG 12-Lead Currently with no symptoms of angina. No further workup at this time. Continue current medication regimen.Previously with symptoms of angina, controlled by taking isosorbide 30 minute grams twice a day   Essential hypertension - Plan: EKG 12-Lead we will add losartan 50 mg daily If blood pressure continues to run high, we need to  increase losartan up to 100 mg daily  NSTEMI (non-ST elevated myocardial infarction) (HCC) - Plan: EKG 12-Lead  Uncontrolled type 2 diabetes mellitus with other circulatory complication, with long-term current use of insulin (HCC) - Plan: EKG 12-Lead Stressed importance of aggressive diabetes control, low sugars, low carbohydrates   Total encounter time more than 25 minutes  Greater than 50% was spent in counseling and coordination of care with the patient   Disposition:   F/U  4 months  Orders Placed This Encounter  Procedures  . EKG 12-Lead     Signed, Dossie Arbourim Gollan, M.D., Ph.D. 09/30/2016  [facility],  [place] [phone]

## 2016-09-30 NOTE — Patient Instructions (Addendum)
Medication Instructions:   Please start losartan one pill a day for blood pressure, protect kidneys  Labwork:  No new labs needed  Testing/Procedures:  No further testing at this time   I recommend watching educational videos on topics of interest to you at:       [email or web address]  Enter code: HEARTCARE    Follow-Up: It was a pleasure seeing you in the office today. Please call us if you have new issues that need to be addressed before your next appt.  [phone]  Your physician wants you to follow-up in: 4 months.  You will receive a reminder letter in the mail two months in advance. If you don't receive a letter, please call our office to schedule the follow-up appointment.  If you need a refill on your cardiac medications before your next appointment, please call your pharmacy.

## 2016-10-03 ENCOUNTER — Ambulatory Visit: Payer: PPO | Admitting: Family

## 2016-10-06 ENCOUNTER — Other Ambulatory Visit (INDEPENDENT_AMBULATORY_CARE_PROVIDER_SITE_OTHER): Payer: PPO

## 2016-10-06 DIAGNOSIS — Z794 Long term (current) use of insulin: Secondary | ICD-10-CM | POA: Diagnosis not present

## 2016-10-06 DIAGNOSIS — E119 Type 2 diabetes mellitus without complications: Secondary | ICD-10-CM | POA: Diagnosis not present

## 2016-10-06 LAB — COMPREHENSIVE METABOLIC PANEL
ALT: 19 U/L (ref 0–53)
AST: 16 U/L (ref 0–37)
Albumin: 3.9 g/dL (ref 3.5–5.2)
Alkaline Phosphatase: 67 U/L (ref 39–117)
BUN: 19 mg/dL (ref 6–23)
CO2: 35 mEq/L — ABNORMAL HIGH (ref 19–32)
Calcium: 9.4 mg/dL (ref 8.4–10.5)
Chloride: 107 mEq/L (ref 96–112)
Creatinine, Ser: 1.03 mg/dL (ref 0.40–1.50)
GFR: 76.58 mL/min (ref >60.00)
Glucose, Bld: 148 mg/dL — ABNORMAL HIGH (ref 70–99)
Potassium: 4.1 mEq/L (ref 3.5–5.1)
Sodium: 145 mEq/L (ref 135–145)
Total Bilirubin: 0.4 mg/dL (ref 0.2–1.2)
Total Protein: 6.9 g/dL (ref 6.0–8.3)

## 2016-10-10 ENCOUNTER — Encounter: Payer: Self-pay | Admitting: Family Medicine

## 2016-10-10 ENCOUNTER — Ambulatory Visit (INDEPENDENT_AMBULATORY_CARE_PROVIDER_SITE_OTHER): Payer: PPO | Admitting: Family Medicine

## 2016-10-10 VITALS — BP 138/60 | HR 71 | Temp 98.0°F | Ht 69.0 in | Wt 305.5 lb

## 2016-10-10 DIAGNOSIS — F331 Major depressive disorder, recurrent, moderate: Secondary | ICD-10-CM

## 2016-10-10 DIAGNOSIS — F528 Other sexual dysfunction not due to a substance or known physiological condition: Secondary | ICD-10-CM | POA: Diagnosis not present

## 2016-10-10 DIAGNOSIS — F4323 Adjustment disorder with mixed anxiety and depressed mood: Secondary | ICD-10-CM | POA: Diagnosis not present

## 2016-10-10 MED ORDER — SILDENAFIL CITRATE 20 MG PO TABS: ORAL_TABLET | ORAL | 0 refills | Status: DC

## 2016-10-10 MED ORDER — VENLAFAXINE HCL ER 37.5 MG PO CP24: ORAL_CAPSULE | ORAL | 3 refills | Status: DC

## 2016-10-10 MED ORDER — ALPRAZOLAM 0.5 MG PO TABS: 0.5000 mg | ORAL_TABLET | Freq: Two times a day (BID) | ORAL | 0 refills | Status: DC | PRN

## 2016-10-10 NOTE — Progress Notes (Signed)
   Subjective:    Patient ID: Estella HuskJohn H Bann, male    DOB: May 24, 1951, 66 y.o.   MRN: [id number]  HPI   66 year old male presents for follow up 4 weeks for moderately severe major depression recurrent and adjustment disorder from loss of wife..  At last OV on 1/12 he was started on  Alprazolam as needed twice daily.  In meantime he was admitted to hospital on 09/20/2016 for Acute CHF.  recommended fluid restriction and low salt diet.  Daily weights and lasix 20 mg  2 times daily  For 2 days.  He followed up with cardiology on 09/30/2016  they recommended he take extra lasix for weight > 304 lbs.  Plans on establishing in CHF clinic. Added losartan for BP control    Today he reports the alprazolam was helpful.Marland Kitchen. He is using alprazolam as needed about once daily. He is not sleeping very well at night. Waking up  In early Am and hard to go back to sleep. He is still very tearful. He has noted some gradually improvement in mood but not that much.  He has tried sertraline, paxil and wellbutrin in past.. Had SE or did not take long.   He has been weighing everyday and wts are stable at home at 300. He has not noted fluid coming back. Wt Readings from Last 3 Encounters:  10/10/16 (!) 305 lb 8 oz (138.6 kg)  09/30/16 (!) 301 lb 8 oz (136.8 kg)  09/21/16 (!) 301 lb 3.2 oz (136.6 kg)    BP much improved with addition of losartan. BP Readings from Last 3 Encounters:  10/10/16 138/60  09/30/16 (!) 158/94  09/21/16 (!) 105/55      Review of Systems  Constitutional: Positive for fatigue. Negative for fever.  HENT: Negative for ear pain.   Eyes: Negative for pain.  Respiratory: Negative for shortness of breath.   Cardiovascular: Negative for chest pain.  Gastrointestinal: Negative for abdominal pain.  Genitourinary:       Erectile dysfunction       Objective:   Physical Exam  Constitutional: Vital signs are normal. He appears well-developed and well-nourished.  Morbidly obese male  in NAD  central obesity  HENT:  Head: Normocephalic.  Right Ear: Hearing normal.  Left Ear: Hearing normal.  Nose: Nose normal.  Mouth/Throat: Oropharynx is clear and moist and mucous membranes are normal.  Neck: Trachea normal. Carotid bruit is not present. No thyroid mass and no thyromegaly present.  Cardiovascular: Normal rate, regular rhythm and normal pulses.  Exam reveals no gallop, no distant heart sounds and no friction rub.   No murmur heard. No peripheral edema  Pulmonary/Chest: Effort normal and breath sounds normal. No respiratory distress.  Skin: Skin is warm, dry and intact. No rash noted.  Psychiatric: His speech is normal. Thought content normal. He is withdrawn. He exhibits a depressed mood.  Tearful through conversation          Assessment & Plan:

## 2016-10-10 NOTE — Patient Instructions (Signed)
Start  37.5 mg daily at bedtime of venlafaxine.  If tolerating increase to 2 tabs daily at bedtime.  Can use alprazolam as needed for anxiety or overwhelming feelings.

## 2016-10-10 NOTE — Assessment & Plan Note (Signed)
Inadequate control. Start venlafaxine at bedtime. Continue alprazolam prn.

## 2016-10-10 NOTE — Progress Notes (Signed)
Pre visit review using our clinic review tool, if applicable. No additional management support is needed unless otherwise documented below in the visit note. 

## 2016-10-10 NOTE — Assessment & Plan Note (Signed)
Likely due to  Vascular disease and medication SE.  Will try trial of sildenafil per pt request. Pt not toi use with nitroglycerin.

## 2016-10-11 ENCOUNTER — Other Ambulatory Visit: Payer: Self-pay | Admitting: Family Medicine

## 2016-10-11 NOTE — Telephone Encounter (Signed)
I believe he is taking this along with his lasix. Verify with pt to make sure.  Potassium at goal at last check, so what ever he is doing NOW is what we need to continue.

## 2016-10-11 NOTE — Telephone Encounter (Signed)
Last office visit 10/10/2016.  Is patient currently on K+?  Ok to refill?

## 2016-10-12 NOTE — Telephone Encounter (Signed)
Spoke with [name].  He states he takes the K+ every morning with his other medications.  [facility]

## 2016-11-08 ENCOUNTER — Ambulatory Visit: Payer: PPO | Admitting: Family Medicine

## 2016-11-09 DIAGNOSIS — H348132 Central retinal vein occlusion, bilateral, stable: Secondary | ICD-10-CM | POA: Diagnosis not present

## 2016-11-09 DIAGNOSIS — E113393 Type 2 diabetes mellitus with moderate nonproliferative diabetic retinopathy without macular edema, bilateral: Secondary | ICD-10-CM | POA: Diagnosis not present

## 2016-11-09 DIAGNOSIS — H3582 Retinal ischemia: Secondary | ICD-10-CM | POA: Diagnosis not present

## 2016-12-01 ENCOUNTER — Ambulatory Visit: Payer: PPO | Admitting: Family

## 2016-12-16 ENCOUNTER — Other Ambulatory Visit: Payer: Self-pay | Admitting: Family Medicine

## 2017-01-02 ENCOUNTER — Other Ambulatory Visit: Payer: Self-pay | Admitting: *Deleted

## 2017-01-02 MED ORDER — DICLOFENAC SODIUM 75 MG PO TBEC: 75.0000 mg | DELAYED_RELEASE_TABLET | Freq: Two times a day (BID) | ORAL | 1 refills | Status: DC

## 2017-01-02 MED ORDER — RANITIDINE HCL 150 MG PO TABS: 150.0000 mg | ORAL_TABLET | Freq: Two times a day (BID) | ORAL | 1 refills | Status: DC | PRN

## 2017-01-11 DIAGNOSIS — E113393 Type 2 diabetes mellitus with moderate nonproliferative diabetic retinopathy without macular edema, bilateral: Secondary | ICD-10-CM | POA: Diagnosis not present

## 2017-01-11 DIAGNOSIS — H3582 Retinal ischemia: Secondary | ICD-10-CM | POA: Diagnosis not present

## 2017-01-11 DIAGNOSIS — H34812 Central retinal vein occlusion, left eye, with macular edema: Secondary | ICD-10-CM | POA: Diagnosis not present

## 2017-01-11 DIAGNOSIS — H348112 Central retinal vein occlusion, right eye, stable: Secondary | ICD-10-CM | POA: Diagnosis not present

## 2017-01-11 LAB — HM DIABETES EYE EXAM

## 2017-01-29 NOTE — Progress Notes (Signed)
Cardiology Office Note  Date:  01/30/2017   ID:  [name], DOB 11/25/1950, MRN [id number]  PCP:  [name], MD   Chief Complaint  Patient presents with  . other    4 month follow up. Meds reviewed by the pt. verbally. Pt. c/o chest tightness at times; more like indigestion.     HPI:  [name] a 65 y.o.malewith a history of morbid obesity,  HTN,  DM,  CAD  NSTEMI in 11/2015 (PCI to LAD; was discharged on Plavix) and again 02/2016 (no PCI; was discharged on Brilinta), Again presented to [facility] ER on 04/05/16 for NSTEMI (no PCI),  catheterization 04/06/2016, stable LAD stent, occluded RCA with collaterals Chronic total occlusion of RCA EF 55 to 60% Who presents for follow-up of his coronary artery disease  Works part time chicken farm, Has not been going much recently Depressed around dinner time Has a lady friend, family found upon it Reports he does not want to get married again Lost his wife 5 months ago, still having trouble adjusting Continues to have atypical chest pain, not with exertion Worsening GERD symptoms not relieved with H2 blocker  Previously with Frequent trips to the emergency room for chest pain relieved with nitroglycerin, none since he has left the hospital 09/21/2016  EKG personally reviewed by myself on todays visit Shows normal sinus rhythm rate 73 bpm no significant ST or T-wave changes  Other past medical history reviewed hospital admission with discharge on 09/21/2016 for acute pulmonary edema, acute on chronic CHF Significant dietary noncompliance, drinks a lot of liquid Was drinking green tea, bottles of diet Coke  Presented to the hospital with shortness of breath and weight gain Poor functional status at baseline Was discharged on only Lasix 40 mg daily  07/03/2016 reports that even relatively active, mowing all day on zero turn mower, Then lifted a heavy toilet for the church Cutback into his truck, developed acute onset chest  pain Took nitroglycerin, aspirin Went to the ER 07/03/16 Symptoms resolved on the way EKG unchanged, "was not like a heart attack" BP went up in the setting of pain  Continues to have chronic fatigue Mild snoring, not bad Will sleep for 3 hours, then wakes up, sits in the living room for 40 minutes then goes back to bed  Previously had problems on brilinta, this caused shortness of breath  EKG on today's visit shows normal sinus rhythm with rate 78 bpm, first-degree AV block, old inferior MI  Other past medical history reviewed Cath 04/06/16 EF 50%  Chronic total occlusion of the right coronary in the proximal segment well collateralized from the left circumflex and LAD.  Widely patent proximal to mid LAD stent previously placed in July. There is first diagonal diagonal and LAD 30 and 50% narrowing respectively.  Widely patent circumflex unchanged from previous with 70% narrowing in a small branch of the first marginal. Circumflex collateralizes the distal right coronary left ventricular branch.  Inferobasal hypokinesis. EF 50%. EDP is normal.   2002 diagnostic catheterization revealing no significant CAD.  catheterization 2008 again showed nonobstructive CAD.   admitted to [facility] in late April 2017 with a two-month history of progressive exertional chest discomfort. ruled in for non-STEMI and subsequent underwent catheterization revealing a chronic total occlusion of the right coronary artery with severe proximal LAD stenosis. The LAD was felt to be the culprit vessel and this was successfully stented using a drug-eluting stent. He was placed on ASA and plavix.  admitted again in 02/2016 for NSTEMI but no culprit found on cath 03/10/16. Felt to be possibly demand ischemia in the setting of the patient's chronically occluded RCA with underlying hypertensive heart disease and moderately elevated LVEDP. He was switched to Brilinta.   re-presented to [facility] on 04/05/16  with recurrent chest pain.    PMH:   has a past medical history of Back injury (02/2002); CHF (congestive heart failure) (HCC); Coronary artery disease, non-occlusive; Depression; Diabetes mellitus type 2, insulin dependent (HCC); Hyperlipemia; Hypertensive heart disease; Kidney stones; Morbid obesity (HCC); Osteoarthritis; and Snoring.  PSH:    Past Surgical History:  Procedure Laterality Date  . CARDIAC CATHETERIZATION  09/2000   diffuse LAD 30% LCA  EF 50-60%  . CARDIAC CATHETERIZATION  06/2007   no significant CAD  . CARDIAC CATHETERIZATION N/A 12/25/2015   Procedure: Left Heart Cath and Coronary Angiography;  Surgeon: Antonieta Iba, MD;  Location: [facility] INVASIVE CV LAB;  Service: Cardiovascular;  Laterality: N/A;  . CARDIAC CATHETERIZATION N/A 12/25/2015   Procedure: Coronary Stent Intervention;  Surgeon: Alwyn Pea, MD;  Location: [facility] INVASIVE CV LAB;  Service: Cardiovascular;  Laterality: N/A;  . CARDIAC CATHETERIZATION N/A 03/10/2016   Procedure: Left Heart Cath and Coronary Angiography;  Surgeon: Iran Ouch, MD;  Location: [facility] INVASIVE CV LAB;  Service: Cardiovascular;  Laterality: N/A;  . CARDIAC CATHETERIZATION N/A 04/06/2016   Procedure: Left Heart Cath and Coronary Angiography;  Surgeon: Lyn Records, MD;  Location: [facility] INVASIVE CV LAB;  Service: Cardiovascular;  Laterality: N/A;  . CIRCUMCISION      Current Outpatient Prescriptions  Medication Sig Dispense Refill  . ALPRAZolam (XANAX) 0.5 MG tablet Take 1 tablet (0.5 mg total) by mouth 2 (two) times daily as needed for anxiety. 30 tablet 0  . aspirin 81 MG EC tablet Take 1 tablet (81 mg total) by mouth daily.    . carvedilol (COREG) 6.25 MG tablet TAKE 1 TABLET BY MOUTH ONCE DAILY 90 tablet 1  . clopidogrel (PLAVIX) 75 MG tablet Take 1 tablet (75 mg total) by mouth daily with breakfast. 90 tablet 3  . cyclobenzaprine (FLEXERIL) 10 MG tablet Take 1 tablet (10 mg total) by mouth every 8 (eight) hours as needed for  muscle spasms. 30 tablet 1  . diclofenac (VOLTAREN) 75 MG EC tablet Take 1 tablet (75 mg total) by mouth 2 (two) times daily. 180 tablet 1  . furosemide (LASIX) 40 MG tablet Take 1 tablet (40 mg total) by mouth 2 (two) times daily. 60 tablet 11  . insulin NPH-regular Human (NOVOLIN 70/30 RELION) (70-30) 100 UNIT/ML injection Inject 80 units every morning and 70 units every evening (Patient taking differently: Inject 60-70 Units into the skin 2 (two) times daily. Inject 70 units subcutaneously before breakfast and 60 units before supper) 4 vial 11  . isosorbide mononitrate (IMDUR) 30 MG 24 hr tablet Take 1 tablet (30 mg total) by mouth 2 (two) times daily. 180 tablet 3  . losartan (COZAAR) 50 MG tablet Take 1 tablet (50 mg total) by mouth daily. 90 tablet 3  . nitroGLYCERIN (NITROSTAT) 0.4 MG SL tablet Place 1 tablet (0.4 mg total) under the tongue every 5 (five) minutes as needed for chest pain. 25 tablet 6  . ONE TOUCH ULTRA TEST test strip USE ONE STRIP TO CHECK GLUCOSE TWICE DAILY 100 each 5  . potassium chloride SA (K-DUR,KLOR-CON) 20 MEQ tablet TAKE 1 TABLET BY MOUTH ONCE DAILY 30 tablet 5  . PRESCRIPTION MEDICATION  Eye drops - administer day before and day after eye injections or laser treatments - next office visit mid December 2017    . ranitidine (ZANTAC) 150 MG tablet Take 1 tablet (150 mg total) by mouth 2 (two) times daily as needed for heartburn. 180 tablet 1  . sildenafil (REVATIO) 20 MG tablet 2-5 tabs po 30 min prior to intercourse 50 tablet 0  . venlafaxine XR (EFFEXOR-XR) 37.5 MG 24 hr capsule 1 tab po daily x 1 week, increase to 2 tabs daily if tolerating 60 capsule 3  . pravastatin (PRAVACHOL) 40 MG tablet Take 1 tablet (40 mg total) by mouth every evening. 90 tablet 3   No current facility-administered medications for this visit.      Allergies:   Atorvastatin   Social History:  The patient  reports that he has never smoked. He has never used smokeless tobacco. He reports  that he does not drink alcohol or use drugs.   Family History:   family history includes Alzheimer's disease in his mother; Cancer in his brother; Diabetes in his father; Emphysema in his mother; Heart disease in his father.    Review of Systems: Review of Systems  Constitutional: Positive for malaise/fatigue.  Respiratory: Negative.   Cardiovascular: Positive for chest pain.  Gastrointestinal: Negative.   Musculoskeletal: Negative.   Neurological: Negative.   Psychiatric/Behavioral: Negative.   All other systems reviewed and are negative.    PHYSICAL EXAM: VS:  BP 130/60 (BP Location: Left Arm, Patient Position: Sitting, Cuff Size: Large)   Pulse 73   Ht 5\' 6"  (1.676 m)   Wt 297 lb 12 oz (135.1 kg)   BMI 48.06 kg/m  , BMI Body mass index is 48.06 kg/m.  GEN: Well nourished, well developed, in no acute distress, obese  HEENT: normal  Neck: no JVD, carotid bruits, or masses Cardiac: RRR; no murmurs, rubs, or gallops,no edema  Respiratory:  clear to auscultation bilaterally, normal work of breathing GI: soft, nontender, nondistended, + BS MS: no deformity or atrophy  Skin: warm and dry, no rash Neuro:  Strength and sensation are intact Psych: euthymic mood, full affect    Recent Labs: 09/20/2016: B Natriuretic Peptide 249.0 09/21/2016: Hemoglobin 12.6; Platelets 203 10/06/2016: ALT 19; BUN 19; Creatinine, Ser 1.03; Potassium 4.1; Sodium 145    Lipid Panel Lab Results  Component Value Date   CHOL 197 02/16/2016   HDL 33.20 (L) 02/16/2016   LDLCALC 139 (H) 02/16/2016   TRIG 122.0 02/16/2016      Wt Readings from Last 3 Encounters:  01/30/17 297 lb 12 oz (135.1 kg)  10/10/16 (!) 305 lb 8 oz (138.6 kg)  09/30/16 (!) 301 lb 8 oz (136.8 kg)       ASSESSMENT AND PLAN:  Acute on chronic diastolic CHF (congestive heart failure) (HCC) - Plan: EKG 12-Lead Recommended he stay on Lasix daily, weight down several pounds from prior clinic visit  Coronary artery  disease, non-occlusive - Plan: EKG 12-Lead Currently with no symptoms of angina. No further workup at this time. Continue current medication regimen. Atypical chest pain in the past We will treat for GERD symptoms, recommended he start omeprazole Will restart pravastatin, he is unclear where this was held  Essential hypertension - Plan: EKG 12-Lead Blood pressure is well controlled on today's visit. No changes made to the medications.  NSTEMI (non-ST elevated myocardial infarction) (HCC) - Plan: EKG 12-Lead Stressed importance of aggressive diabetes and cholesterol control We'll restart pravastatin May need to add  Zetia  Uncontrolled type 2 diabetes mellitus with other circulatory complication, with long-term current use of insulin (HCC) - Plan: EKG 12-Lead Stressed importance of aggressive diabetes control, low sugars, low carbohydrates   Total encounter time more than 25 minutes  Greater than 50% was spent in counseling and coordination of care with the patient   Disposition:   F/U  12 months   Orders Placed This Encounter  Procedures  . EKG 12-Lead     Signed, Dossie Arbour, M.D., Ph.D. 01/30/2017  [facility] [place], [place] [phone]

## 2017-01-30 ENCOUNTER — Encounter: Payer: Self-pay | Admitting: Cardiovascular Disease

## 2017-01-30 ENCOUNTER — Encounter: Payer: Self-pay | Admitting: Family Medicine

## 2017-01-30 ENCOUNTER — Ambulatory Visit (INDEPENDENT_AMBULATORY_CARE_PROVIDER_SITE_OTHER): Payer: PPO | Admitting: Cardiovascular Disease

## 2017-01-30 VITALS — BP 130/60 | HR 73 | Ht 66.0 in | Wt 297.8 lb

## 2017-01-30 DIAGNOSIS — E1159 Type 2 diabetes mellitus with other circulatory complications: Secondary | ICD-10-CM

## 2017-01-30 DIAGNOSIS — I25118 Atherosclerotic heart disease of native coronary artery with other forms of angina pectoris: Secondary | ICD-10-CM | POA: Diagnosis not present

## 2017-01-30 DIAGNOSIS — E785 Hyperlipidemia, unspecified: Secondary | ICD-10-CM

## 2017-01-30 DIAGNOSIS — E1165 Type 2 diabetes mellitus with hyperglycemia: Secondary | ICD-10-CM | POA: Diagnosis not present

## 2017-01-30 DIAGNOSIS — G8929 Other chronic pain: Secondary | ICD-10-CM | POA: Diagnosis not present

## 2017-01-30 DIAGNOSIS — R079 Chest pain, unspecified: Secondary | ICD-10-CM

## 2017-01-30 DIAGNOSIS — I10 Essential (primary) hypertension: Secondary | ICD-10-CM | POA: Diagnosis not present

## 2017-01-30 DIAGNOSIS — Z794 Long term (current) use of insulin: Secondary | ICD-10-CM | POA: Diagnosis not present

## 2017-01-30 DIAGNOSIS — IMO0002 Reserved for concepts with insufficient information to code with codable children: Secondary | ICD-10-CM

## 2017-01-30 DIAGNOSIS — I5032 Chronic diastolic (congestive) heart failure: Secondary | ICD-10-CM | POA: Diagnosis not present

## 2017-01-30 MED ORDER — PRAVASTATIN SODIUM 40 MG PO TABS: 40.0000 mg | ORAL_TABLET | Freq: Every evening | ORAL | 3 refills | Status: DC

## 2017-01-30 NOTE — Patient Instructions (Addendum)
Medication Instructions:   Please restart the pravastatin one a day Call if you get aches/pain  Try the over the counter omeprazole one a day  Labwork:  No new labs needed  Testing/Procedures:  No further testing at this time   I recommend watching educational videos on topics of interest to you at:       [email or web address]  Enter code: HEARTCARE    Follow-Up: It was a pleasure seeing you in the office today. Please call us if you have new issues that need to be addressed before your next appt.  [phone]  Your physician wants you to follow-up in: 12 months.  You will receive a reminder letter in the mail two months in advance. If you don't receive a letter, please call our office to schedule the follow-up appointment.  If you need a refill on your cardiac medications before your next appointment, please call your pharmacy.

## 2017-02-01 DIAGNOSIS — H348112 Central retinal vein occlusion, right eye, stable: Secondary | ICD-10-CM | POA: Diagnosis not present

## 2017-02-01 DIAGNOSIS — H3563 Retinal hemorrhage, bilateral: Secondary | ICD-10-CM | POA: Diagnosis not present

## 2017-02-01 DIAGNOSIS — H34812 Central retinal vein occlusion, left eye, with macular edema: Secondary | ICD-10-CM | POA: Diagnosis not present

## 2017-02-01 DIAGNOSIS — E113393 Type 2 diabetes mellitus with moderate nonproliferative diabetic retinopathy without macular edema, bilateral: Secondary | ICD-10-CM | POA: Diagnosis not present

## 2017-02-21 ENCOUNTER — Other Ambulatory Visit: Payer: Self-pay

## 2017-02-21 ENCOUNTER — Ambulatory Visit (INDEPENDENT_AMBULATORY_CARE_PROVIDER_SITE_OTHER): Payer: PPO

## 2017-02-21 VITALS — BP 128/76 | HR 68 | Temp 98.2°F | Ht 66.5 in | Wt 298.8 lb

## 2017-02-21 DIAGNOSIS — Z794 Long term (current) use of insulin: Secondary | ICD-10-CM | POA: Diagnosis not present

## 2017-02-21 DIAGNOSIS — I2510 Atherosclerotic heart disease of native coronary artery without angina pectoris: Secondary | ICD-10-CM

## 2017-02-21 DIAGNOSIS — E0865 Diabetes mellitus due to underlying condition with hyperglycemia: Secondary | ICD-10-CM | POA: Diagnosis not present

## 2017-02-21 DIAGNOSIS — Z125 Encounter for screening for malignant neoplasm of prostate: Secondary | ICD-10-CM | POA: Diagnosis not present

## 2017-02-21 DIAGNOSIS — Z0000 Encounter for general adult medical examination without abnormal findings: Secondary | ICD-10-CM

## 2017-02-21 DIAGNOSIS — IMO0002 Reserved for concepts with insufficient information to code with codable children: Secondary | ICD-10-CM

## 2017-02-21 DIAGNOSIS — E0859 Diabetes mellitus due to underlying condition with other circulatory complications: Secondary | ICD-10-CM

## 2017-02-21 LAB — MICROALBUMIN / CREATININE URINE RATIO
Creatinine,U: 46.4 mg/dL
Microalb Creat Ratio: 1.5 mg/g (ref 0.0–30.0)
Microalb, Ur: <0.7 mg/dL (ref 0.0–1.9)

## 2017-02-21 LAB — LIPID PANEL
Cholesterol: 150 mg/dL (ref 0–200)
HDL: 29.8 mg/dL — ABNORMAL LOW (ref >39.00)
LDL Cholesterol: 87 mg/dL (ref 0–99)
NonHDL: 120.41
Total CHOL/HDL Ratio: 5
Triglycerides: 167 mg/dL — ABNORMAL HIGH (ref 0.0–149.0)
VLDL: 33.4 mg/dL (ref 0.0–40.0)

## 2017-02-21 LAB — HEMOGLOBIN A1C: Hgb A1c MFr Bld: 8.5 % — ABNORMAL HIGH (ref 4.6–6.5)

## 2017-02-21 LAB — PSA, MEDICARE: PSA: 0.35 ng/ml (ref 0.10–4.00)

## 2017-02-21 MED ORDER — ALPRAZOLAM 0.5 MG PO TABS: 0.5000 mg | ORAL_TABLET | Freq: Two times a day (BID) | ORAL | 0 refills | Status: DC | PRN

## 2017-02-21 NOTE — Progress Notes (Signed)
Subjective:   [name] is a 66 y.o. male who presents for Medicare Annual/Subsequent preventive examination.  Review of Systems:  N/A Cardiac Risk Factors include: advanced age (>79men, >66 women);obesity (BMI >30kg/m2);male gender;diabetes mellitus;dyslipidemia;hypertension     Objective:    Vitals: BP 128/76 (BP Location: Right Arm, Patient Position: Sitting, Cuff Size: Large)   Pulse 68   Temp 98.2 F (36.8 C) (Oral)   Ht 5' 6.5" (1.689 m) Comment: shoes  Wt 298 lb 12 oz (135.5 kg)   SpO2 96%   BMI 47.50 kg/m   Body mass index is 47.5 kg/m.  Tobacco History  Smoking Status  . Never Smoker  Smokeless Tobacco  . Never Used     Counseling given: No   Past Medical History:  Diagnosis Date  . Back injury 02/2002   worker's comp  . CHF (congestive heart failure) (HCC)   . Coronary artery disease, non-occlusive    a. cath 2002 with no sig CAD;  b. cath 2008 normal LM, LAD, LCx, p&dRCA 20-30%, PDA 30%; c.11/2015 NSTEMI/PCI: LM nl, LAD 95p (2.5x15 Xience DES), LCX nl, RCA 100p/m w/ L->R collats, EF 55-65% c. NSTEMI (02/2016) with no culprit leision, switched to Brilinta.  d. NSTEMI 03/2016: again, no culprit lesion and switched back to plavix 2/2 SOB with Brilnta.    . Depression   . Diabetes mellitus type 2, insulin dependent (HCC)   . Hyperlipemia   . Hypertensive heart disease   . Kidney stones   . Morbid obesity (HCC)   . Osteoarthritis   . Snoring    Past Surgical History:  Procedure Laterality Date  . CARDIAC CATHETERIZATION  09/2000   diffuse LAD 30% LCA  EF 50-60%  . CARDIAC CATHETERIZATION  06/2007   no significant CAD  . CARDIAC CATHETERIZATION N/A 12/25/2015   Procedure: Left Heart Cath and Coronary Angiography;  Surgeon: Antonieta Iba, MD;  Location: [facility] INVASIVE CV LAB;  Service: Cardiovascular;  Laterality: N/A;  . CARDIAC CATHETERIZATION N/A 12/25/2015   Procedure: Coronary Stent Intervention;  Surgeon: Alwyn Pea, MD;  Location: [facility]  INVASIVE CV LAB;  Service: Cardiovascular;  Laterality: N/A;  . CARDIAC CATHETERIZATION N/A 03/10/2016   Procedure: Left Heart Cath and Coronary Angiography;  Surgeon: Iran Ouch, MD;  Location: [facility] INVASIVE CV LAB;  Service: Cardiovascular;  Laterality: N/A;  . CARDIAC CATHETERIZATION N/A 04/06/2016   Procedure: Left Heart Cath and Coronary Angiography;  Surgeon: Lyn Records, MD;  Location: [facility] INVASIVE CV LAB;  Service: Cardiovascular;  Laterality: N/A;  . CIRCUMCISION     Family History  Problem Relation Age of Onset  . Alzheimer's disease Mother   . Emphysema Mother   . Diabetes Father   . Heart disease Father        MI  . Cancer Brother        ? Neck cancer   History  Sexual Activity  . Sexual activity: Not on file    Outpatient Encounter Prescriptions as of 02/21/2017  Medication Sig  . aspirin 81 MG EC tablet Take 1 tablet (81 mg total) by mouth daily.  . carvedilol (COREG) 6.25 MG tablet TAKE 1 TABLET BY MOUTH ONCE DAILY  . clopidogrel (PLAVIX) 75 MG tablet Take 1 tablet (75 mg total) by mouth daily with breakfast.  . cyclobenzaprine (FLEXERIL) 10 MG tablet Take 1 tablet (10 mg total) by mouth every 8 (eight) hours as needed for muscle spasms.  . diclofenac (VOLTAREN) 75 MG EC tablet  Take 1 tablet (75 mg total) by mouth 2 (two) times daily.  . furosemide (LASIX) 40 MG tablet Take 1 tablet (40 mg total) by mouth 2 (two) times daily.  . insulin NPH-regular Human (NOVOLIN 70/30 RELION) (70-30) 100 UNIT/ML injection Inject 80 units every morning and 70 units every evening (Patient taking differently: Inject 60-70 Units into the skin 2 (two) times daily. Inject 70 units subcutaneously before breakfast and 60 units before supper)  . isosorbide mononitrate (IMDUR) 30 MG 24 hr tablet Take 1 tablet (30 mg total) by mouth 2 (two) times daily.  Marland Kitchen losartan (COZAAR) 50 MG tablet Take 1 tablet (50 mg total) by mouth daily.  . nitroGLYCERIN (NITROSTAT) 0.4 MG SL tablet Place 1 tablet  (0.4 mg total) under the tongue every 5 (five) minutes as needed for chest pain.  . ONE TOUCH ULTRA TEST test strip USE ONE STRIP TO CHECK GLUCOSE TWICE DAILY  . potassium chloride SA (K-DUR,KLOR-CON) 20 MEQ tablet TAKE 1 TABLET BY MOUTH ONCE DAILY  . pravastatin (PRAVACHOL) 40 MG tablet Take 1 tablet (40 mg total) by mouth every evening.  Marland Kitchen PRESCRIPTION MEDICATION Eye drops - administer day before and day after eye injections or laser treatments - next office visit mid December 2017  . ranitidine (ZANTAC) 150 MG tablet Take 1 tablet (150 mg total) by mouth 2 (two) times daily as needed for heartburn.  . sildenafil (REVATIO) 20 MG tablet 2-5 tabs po 30 min prior to intercourse  . venlafaxine XR (EFFEXOR-XR) 37.5 MG 24 hr capsule 1 tab po daily x 1 week, increase to 2 tabs daily if tolerating  . ALPRAZolam (XANAX) 0.5 MG tablet Take 1 tablet (0.5 mg total) by mouth 2 (two) times daily as needed for anxiety.   No facility-administered encounter medications on file as of 02/21/2017.     Activities of Daily Living In your present state of health, do you have any difficulty performing the following activities: 02/21/2017 09/20/2016  Hearing? N N  Vision? Y N  Difficulty concentrating or making decisions? Y N  Walking or climbing stairs? Y N  Dressing or bathing? N N  Doing errands, shopping? N N  Preparing Food and eating ? N -  Using the Toilet? N -  In the past six months, have you accidently leaked urine? N -  Do you have problems with loss of bowel control? N -  Managing your Medications? N -  Managing your Finances? N -  Housekeeping or managing your Housekeeping? N -  Some recent data might be hidden    Patient Care Team: [name], MD as PCP - General Mariah Milling Tollie Pizza, MD as Consulting Physician (Cardiology)   Assessment:    N/A Exercise Activities and Dietary recommendations Current Exercise Habits: The patient does not participate in regular exercise at present, Exercise  limited by: orthopedic condition(s);cardiac condition(s)  Goals    . Increase physical activity          Starting 02/21/17, I will attempt to walk at least 15 min daily.       Fall Risk Fall Risk  02/21/2017 11/20/2015 11/20/2015 12/21/2012  Falls in the past year? No No No No   Depression Screen PHQ 2/9 Scores 02/21/2017 10/10/2016 11/20/2015 11/20/2015  PHQ - 2 Score 3 6 0 0  PHQ- 9 Score 11 17 - -    Cognitive Function MMSE - Mini Mental State Exam 02/21/2017  Orientation to time 5  Orientation to Place 5  Registration 3  Attention/ Calculation 0  Recall 3  Language- name 2 objects 0  Language- repeat 1  Language- follow 3 step command 3  Language- read & follow direction 0  Write a sentence 0  Copy design 0  Total score 20     PLEASE NOTE: A Mini-Cog screen was completed. Maximum score is 20. A value of 0 denotes this part of Folstein MMSE was not completed or the patient failed this part of the Mini-Cog screening.   Mini-Cog Screening Orientation to Time - Max 5 pts Orientation to Place - Max 5 pts Registration - Max 3 pts Recall - Max 3 pts Language Repeat - Max 1 pts Language Follow 3 Step Command - Max 3 pts     Immunization History  Administered Date(s) Administered  . Influenza Split 05/12/2011, 07/09/2012  . Influenza Whole 06/30/2007, 05/29/2008, 07/03/2009  . Influenza,inj,Quad PF,36+ Mos 05/31/2013, 07/09/2014, 06/25/2015, 07/05/2016  . Pneumococcal Conjugate-13 02/14/2015  . Pneumococcal Polysaccharide-23 06/30/2007, 12/21/2012  . Td 06/30/2007   Screening Tests Health Maintenance  Topic Date Due  . FOOT EXAM  02/28/2017 (Originally 11/19/2016)  . INFLUENZA VACCINE  03/29/2017  . TETANUS/TDAP  06/29/2017  . HEMOGLOBIN A1C  08/23/2017  . PNA vac Low Risk Adult (2 of 2 - PPSV23) 12/21/2017  . OPHTHALMOLOGY EXAM  01/11/2018  . COLONOSCOPY  09/04/2019  . Hepatitis C Screening  Completed      Plan:     I have personally reviewed and addressed the  Medicare Annual Wellness questionnaire and have noted the following in the patient's chart:  A. Medical and social history B. Use of alcohol, tobacco or illicit drugs  C. Current medications and supplements D. Functional ability and status E.  Nutritional status F.  Physical activity G. Advance directives H. List of other physicians I.  Hospitalizations, surgeries, and ER visits in previous 12 months J.  Vitals K. Screenings to include hearing, vision, cognitive, depression L. Referrals and appointments - none  In addition, I have reviewed and discussed with patient certain preventive protocols, quality metrics, and best practice recommendations. A written personalized care plan for preventive services as well as general preventive health recommendations were provided to patient.  See attached scanned questionnaire for additional information.   Signed,   Randa EvensLesia Makala Fetterolf, MHA, BS, LPN Health Coach

## 2017-02-21 NOTE — Progress Notes (Signed)
Pre visit review using our clinic review tool, if applicable. No additional management support is needed unless otherwise documented below in the visit note. 

## 2017-02-21 NOTE — Progress Notes (Signed)
I reviewed health advisor's note, was available for consultation, and agree with documentation and plan.  

## 2017-02-21 NOTE — Telephone Encounter (Signed)
Patient in today for AWV. Pt requested refill for Xanax.

## 2017-02-21 NOTE — Progress Notes (Signed)
PCP notes:   Health maintenance:  Foot exam - PCP please address at next appt A1C - completed  Abnormal screenings:   Hearing - failed Depression score : 11  Patient concerns:   Pt has requested refill for Xanax. Refill request sent to PCP.   Nurse concerns:  None  Next PCP appt:   02/28/17 @ 0800

## 2017-02-21 NOTE — Patient Instructions (Signed)
[name] , Thank you for taking time to come for your Medicare Wellness Visit. I appreciate your ongoing commitment to your health goals. Please review the following plan we discussed and let me know if I can assist you in the future.   These are the goals we discussed: Goals    . Increase physical activity          Starting 02/21/17, I will attempt to walk at least 15 min daily.        This is a list of the screening recommended for you and due dates:  Health Maintenance  Topic Date Due  . Complete foot exam   02/28/2017*  . Flu Shot  03/29/2017  . Tetanus Vaccine  06/29/2017  . Hemoglobin A1C  08/23/2017  . Pneumonia vaccines (2 of 2 - PPSV23) 12/21/2017  . Eye exam for diabetics  01/11/2018  . Colon Cancer Screening  09/04/2019  .  Hepatitis C: One time screening is recommended by Center for Disease Control  (CDC) for  adults born from [birth year] through 1966.   Completed  *Topic was postponed. The date shown is not the original due date.   Preventive Care for Adults  A healthy lifestyle and preventive care can promote health and wellness. Preventive health guidelines for adults include the following key practices.  . A routine yearly physical is a good way to check with your health care provider about your health and preventive screening. It is a chance to share any concerns and updates on your health and to receive a thorough exam.  . Visit your dentist for a routine exam and preventive care every 6 months. Brush your teeth twice a day and floss once a day. Good oral hygiene prevents tooth decay and gum disease.  . The frequency of eye exams is based on your age, health, family medical history, use  of contact lenses, and other factors. Follow your health care provider's ecommendations for frequency of eye exams.  . Eat a healthy diet. Foods like vegetables, fruits, whole grains, low-fat dairy products, and lean protein foods contain the nutrients you need without too many  calories. Decrease your intake of foods high in solid fats, added sugars, and salt. Eat the right amount of calories for you. Get information about a proper diet from your health care provider, if necessary.  . Regular physical exercise is one of the most important things you can do for your health. Most adults should get at least 150 minutes of moderate-intensity exercise (any activity that increases your heart rate and causes you to sweat) each week. In addition, most adults need muscle-strengthening exercises on 2 or more days a week.  Silver Sneakers may be a benefit available to you. To determine eligibility, you may visit the website: www.silversneakers.com or contact program at 769-246-7291 Mon-Fri between 8AM-8PM.   . Maintain a healthy weight. The body mass index (BMI) is a screening tool to identify possible weight problems. It provides an estimate of body fat based on height and weight. Your health care provider can find your BMI and can help you achieve or maintain a healthy weight.   For adults 20 years and older: ? A BMI below 18.5 is considered underweight. ? A BMI of 18.5 to 24.9 is normal. ? A BMI of 25 to 29.9 is considered overweight. ? A BMI of 30 and above is considered obese.   . Maintain normal blood lipids and cholesterol levels by exercising and minimizing your intake of saturated  fat. Eat a balanced diet with plenty of fruit and vegetables. Blood tests for lipids and cholesterol should begin at age 67 and be repeated every 5 years. If your lipid or cholesterol levels are high, you are over 50, or you are at high risk for heart disease, you may need your cholesterol levels checked more frequently. Ongoing high lipid and cholesterol levels should be treated with medicines if diet and exercise are not working.  . If you smoke, find out from your health care provider how to quit. If you do not use tobacco, please do not start.  . If you choose to drink alcohol, please do  not consume more than 2 drinks per day. One drink is considered to be 12 ounces (355 mL) of beer, 5 ounces (148 mL) of wine, or 1.5 ounces (44 mL) of liquor.  . If you are 2055-67 years old, ask your health care provider if you should take aspirin to prevent strokes.  . Use sunscreen. Apply sunscreen liberally and repeatedly throughout the day. You should seek shade when your shadow is shorter than you. Protect yourself by wearing long sleeves, pants, a wide-brimmed hat, and sunglasses year round, whenever you are outdoors.  . Once a month, do a whole body skin exam, using a mirror to look at the skin on your back. Tell your health care provider of new moles, moles that have irregular borders, moles that are larger than a pencil eraser, or moles that have changed in shape or color.

## 2017-02-23 NOTE — Telephone Encounter (Signed)
Rx faxed to pharmacy  

## 2017-02-28 ENCOUNTER — Encounter: Payer: Self-pay | Admitting: Family Medicine

## 2017-02-28 ENCOUNTER — Ambulatory Visit (INDEPENDENT_AMBULATORY_CARE_PROVIDER_SITE_OTHER): Payer: PPO | Admitting: Family Medicine

## 2017-02-28 VITALS — BP 148/62 | HR 81 | Temp 98.6°F | Ht 66.5 in | Wt 300.2 lb

## 2017-02-28 DIAGNOSIS — E1365 Other specified diabetes mellitus with hyperglycemia: Secondary | ICD-10-CM

## 2017-02-28 DIAGNOSIS — E1339 Other specified diabetes mellitus with other diabetic ophthalmic complication: Secondary | ICD-10-CM

## 2017-02-28 DIAGNOSIS — I10 Essential (primary) hypertension: Secondary | ICD-10-CM | POA: Diagnosis not present

## 2017-02-28 DIAGNOSIS — K219 Gastro-esophageal reflux disease without esophagitis: Secondary | ICD-10-CM | POA: Diagnosis not present

## 2017-02-28 DIAGNOSIS — F331 Major depressive disorder, recurrent, moderate: Secondary | ICD-10-CM | POA: Diagnosis not present

## 2017-02-28 DIAGNOSIS — E11319 Type 2 diabetes mellitus with unspecified diabetic retinopathy without macular edema: Secondary | ICD-10-CM

## 2017-02-28 DIAGNOSIS — Z0001 Encounter for general adult medical examination with abnormal findings: Secondary | ICD-10-CM

## 2017-02-28 DIAGNOSIS — IMO0002 Reserved for concepts with insufficient information to code with codable children: Secondary | ICD-10-CM

## 2017-02-28 DIAGNOSIS — E113393 Type 2 diabetes mellitus with moderate nonproliferative diabetic retinopathy without macular edema, bilateral: Secondary | ICD-10-CM

## 2017-02-28 DIAGNOSIS — E785 Hyperlipidemia, unspecified: Secondary | ICD-10-CM | POA: Diagnosis not present

## 2017-02-28 DIAGNOSIS — I110 Hypertensive heart disease with heart failure: Secondary | ICD-10-CM | POA: Diagnosis not present

## 2017-02-28 DIAGNOSIS — E1165 Type 2 diabetes mellitus with hyperglycemia: Secondary | ICD-10-CM

## 2017-02-28 DIAGNOSIS — Z0000 Encounter for general adult medical examination without abnormal findings: Secondary | ICD-10-CM

## 2017-02-28 DIAGNOSIS — I2510 Atherosclerotic heart disease of native coronary artery without angina pectoris: Secondary | ICD-10-CM | POA: Diagnosis not present

## 2017-02-28 LAB — HM DIABETES FOOT EXAM

## 2017-02-28 MED ORDER — ALPRAZOLAM 0.5 MG PO TABS: 0.5000 mg | ORAL_TABLET | Freq: Two times a day (BID) | ORAL | 0 refills | Status: DC | PRN

## 2017-02-28 NOTE — Assessment & Plan Note (Signed)
Euvolemic today. 

## 2017-02-28 NOTE — Assessment & Plan Note (Signed)
Well controlled. Continue current medication.  

## 2017-02-28 NOTE — Patient Instructions (Addendum)
Work on low [place] diet.. Do not eat if blood sugar in nml range.  Try smaller bedtime snack.  Work on increasing exercise.

## 2017-02-28 NOTE — Assessment & Plan Note (Signed)
Encouraged exercise, weight loss, healthy eating habits. ? ?

## 2017-02-28 NOTE — Assessment & Plan Note (Signed)
Inadequate control. He has symptoms when sugatr in nml rage... No lows. He is eating to avoid normal blood sugar!! Counseled on normal levels and not eating to avoid nmls. Will resolve as body gets used to normals.  decrease size of bedtime snack.  Increase movement in general.

## 2017-02-28 NOTE — Assessment & Plan Note (Signed)
Stable control on current regimen. Refilled alprazolam ti use prn.. Encouraged pt to liumit as mood improving.

## 2017-02-28 NOTE — Assessment & Plan Note (Signed)
Significantly improved on statin. Pt now compliant.

## 2017-02-28 NOTE — Progress Notes (Addendum)
Subjective:    Patient ID: [name], male    DOB: [birth year], 66 y.o.   MRN: [id number]  HPI   The patient presents for annual medicare wellness, complete physical and review of chronic health problems. He/She also has the following acute concerns today: Numbness in hands when sugar in nml range.  Fatigue, chronic.    The patient saw Lu Duffel, LPN for medicare wellness. Note reviewed in detail and important notes copied below.  Health maintenance: Foot exam - PCP please address at next appt A1C - completed Abnormal screenings:  Hearing - failed Depression score : 11 Patient concerns:  Pt has requested refill for Xanax. Refill request sent to PCP.    TODAY:  Diabetes:   Poor control on DM.Marland Kitchen trouble avoiding bread and sweet. Lab Results  Component Value Date   HGBA1C 8.5 (H) 02/21/2017  Using medications without difficulties: Hypoglycemic episodes: none Hyperglycemic episodes: yes Feet problems: none Blood Sugars averaging: FBS 97-178 eye exam within last year: yes.. Followed closely as retinopathy present   Hypertension:  He feels  BP was elevated given right prior he was talking with CMA about deceased wife. BP Readings from Last 3 Encounters:  02/28/17 (!) 148/62  02/21/17 128/76  01/30/17 130/60  Using medication without problems or lightheadedness:  none Chest pain with exertion: none  stopped lemon and onion .Marland Kitchen GERD is better. Edema: stable Short of breath: stable Average home BPs: good control Other issues:   Elevated Cholesterol:  Good control on pravastatin Lab Results  Component Value Date   CHOL 150 02/21/2017   HDL 29.80 (L) 02/21/2017   LDLCALC 87 02/21/2017   LDLDIRECT 186.9 05/31/2013   TRIG 167.0 (H) 02/21/2017   CHOLHDL 5 02/21/2017  Using medications without problems: none at this time Muscle aches: none Diet compliance: moderate Exercise: minimal Other complaints: HX of CAD followed by cards, chf  Major depressive  do:  He reports good days and bad day. Venlafaxine has helped a lot.  He request refill of alprazolam to use prn... Would use 1-2 times a week if he had.  no SI.    Social History /Family History/Past Medical History reviewed in detail and updated in EMR if needed. Blood pressure (!) 148/62, pulse 81, temperature 98.6 F (37 C), temperature source Oral, height 5' 6.5" (1.689 m), weight (!) 300 lb 4 oz (136.2 kg).  Review of Systems  Constitutional: Positive for fatigue. Negative for fever.  HENT: Negative for ear pain.   Eyes: Negative for pain.  Respiratory: Negative for cough and shortness of breath.   Cardiovascular: Negative for chest pain, palpitations and leg swelling.  Gastrointestinal: Negative for abdominal pain.  Genitourinary: Negative for dysuria.  Musculoskeletal: Negative for arthralgias.  Neurological: Negative for syncope, light-headedness and headaches.  Psychiatric/Behavioral: Negative for dysphoric mood.       Objective:   Physical Exam  Constitutional: He is oriented to person, place, and time. Vital signs are normal. He appears well-developed and well-nourished.  Morbid obesity   HENT:  Head: Normocephalic.  Right Ear: Hearing normal.  Left Ear: Hearing normal.  Nose: Nose normal.  Mouth/Throat: Oropharynx is clear and moist and mucous membranes are normal.  Neck: Trachea normal. Carotid bruit is not present. No thyroid mass and no thyromegaly present.  Cardiovascular: Normal rate, regular rhythm and normal pulses.  Exam reveals no gallop, no distant heart sounds and no friction rub.   No murmur heard. No peripheral edema  Pulmonary/Chest: Effort normal and breath  sounds normal. No respiratory distress.  Neurological: He is alert and oriented to person, place, and time.  Skin: Skin is warm, dry and intact. No rash noted.  Psychiatric: He has a normal mood and affect. His speech is normal and behavior is normal. Thought content normal.      Diabetic  foot exam: Normal inspection No skin breakdown No calluses  Normal DP pulses Normal sensation to light touch and monofilament Nails normal     Assessment & Plan:  [name] preventative maintenance and recommended screening tests for an annual wellness exam were reviewed in full today. Brought up to date unless services declined.  Counselled on the importance of diet, exercise, and its role in overall health and mortality. [name] FH and SH was reviewed, including their home life, tobacco status, and drug and alcohol status.   Vaccines: uptodate except for shingles, will consider.  Colon: nml 2011, repeat in 10 years  PSA:  stable Lab Results  Component Value Date   PSA 0.35 02/21/2017   PSA 0.27 10/19/2015   PSA 0.25 02/22/2013  nonsmoker  Hep C:done

## 2017-02-28 NOTE — Assessment & Plan Note (Signed)
Resolved with avoidance of citrus.

## 2017-03-03 NOTE — Assessment & Plan Note (Signed)
Needs better diabetes control given retinal changes. Followed closely by opthalmology.

## 2017-03-23 ENCOUNTER — Other Ambulatory Visit: Payer: Self-pay | Admitting: Family Medicine

## 2017-03-27 ENCOUNTER — Telehealth: Payer: Self-pay

## 2017-03-27 MED ORDER — PANTOPRAZOLE SODIUM 40 MG PO TBEC: 40.0000 mg | DELAYED_RELEASE_TABLET | Freq: Every day | ORAL | 3 refills | Status: DC

## 2017-03-27 NOTE — Telephone Encounter (Signed)
Will see tomorrow. May start omeprazole 40mg  [facility]

## 2017-03-27 NOTE — Telephone Encounter (Signed)
Patient walks in to make an appointment to see the doctor regarding his ongoing increase in acid reflux symptoms.  I spoke with patient by phone as he is requesting we call him in something this pm to help.    Symptoms include:   Acid sensation in throat with throat irritation Upper esophageal irritation and discomfort Symptoms ongoing for a while but progressing Has never taken any PPI type medication  He denies any chest pain, SOB or radiating pains down arm. Discomfort often noted following meals.  I informed him that he will likely need to wait and see the doctor tomorrow (appt with Dr. Reece AgarG) for further workup and determination for best medication.    He is okay with waiting and if doesn't hear back from us tonight, will wait for tx recommendations at appt.     Thanks,

## 2017-03-28 ENCOUNTER — Encounter: Payer: Self-pay | Admitting: Family Medicine

## 2017-03-28 ENCOUNTER — Ambulatory Visit (INDEPENDENT_AMBULATORY_CARE_PROVIDER_SITE_OTHER): Payer: PPO | Admitting: Family Medicine

## 2017-03-28 DIAGNOSIS — K219 Gastro-esophageal reflux disease without esophagitis: Secondary | ICD-10-CM

## 2017-03-28 NOTE — Assessment & Plan Note (Addendum)
Acute worsening over the past month - not responsive to OTC omeprazole. Doing better since addition of protonix 40mg  last night - will continue for 3 wk course then PRN. Reviewed GERD precautions as per instructions, provided with educational handout, encouraged weight loss. No red flags.

## 2017-03-28 NOTE — Patient Instructions (Addendum)
I think this is GERD (reflux).  Continue protonix 40mg  daily for 3 weeks then as needed. Try to minimize diclofenac 75mg  use (for knees).   Head of bed elevated. Avoidance of citrus, fatty foods, chocolate, peppermint, and excessive alcohol, along with sodas, orange juice (acidic drinks) At least a few hours between dinner and bed, minimize naps after eating. No smoking.  Heartburn Heartburn is a type of pain or discomfort that can happen in the throat or chest. It is often described as a burning pain. It may also cause a bad taste in the mouth. Heartburn may feel worse when you lie down or bend over, and it is often worse at night. Heartburn may be caused by stomach contents that move back up into the esophagus (reflux). Follow these instructions at home: Take these actions to decrease your discomfort and to help avoid complications. Diet  Follow a diet as recommended by your health care provider. This may involve avoiding foods and drinks such as: ? Coffee and tea (with or without caffeine). ? Drinks that contain alcohol. ? Energy drinks and sports drinks. ? Carbonated drinks or sodas. ? Chocolate and cocoa. ? Peppermint and mint flavorings. ? Garlic and onions. ? Horseradish. ? Spicy and acidic foods, including peppers, chili powder, curry powder, vinegar, hot sauces, and barbecue sauce. ? Citrus fruit juices and citrus fruits, such as oranges, lemons, and limes. ? Tomato-based foods, such as red sauce, chili, salsa, and pizza with red sauce. ? Fried and fatty foods, such as donuts, french fries, potato chips, and high-fat dressings. ? High-fat meats, such as hot dogs and fatty cuts of red and white meats, such as rib eye steak, sausage, ham, and bacon. ? High-fat dairy items, such as whole milk, butter, and cream cheese.  Eat small, frequent meals instead of large meals.  Avoid drinking large amounts of liquid with your meals.  Avoid eating meals during the 2-3 hours before  bedtime.  Avoid lying down right after you eat.  Do not exercise right after you eat. General instructions  Pay attention to any changes in your symptoms.  Take over-the-counter and prescription medicines only as told by your health care provider. Do not take aspirin, ibuprofen, or other NSAIDs unless your health care provider told you to do so.  Do not use any tobacco products, including cigarettes, chewing tobacco, and e-cigarettes. If you need help quitting, ask your health care provider.  Wear loose-fitting clothing. Do not wear anything tight around your waist that causes pressure on your abdomen.  Raise (elevate) the head of your bed about 6 inches (15 cm).  Try to reduce your stress, such as with yoga or meditation. If you need help reducing stress, ask your health care provider.  If you are overweight, reduce your weight to an amount that is healthy for you. Ask your health care provider for guidance about a safe weight loss goal.  Keep all follow-up visits as told by your health care provider. This is important. Contact a health care provider if:  You have new symptoms.  You have unexplained weight loss.  You have difficulty swallowing, or it hurts to swallow.  You have wheezing or a persistent cough.  Your symptoms do not improve with treatment.  You have frequent heartburn for more than two weeks. Get help right away if:  You have pain in your arms, neck, jaw, teeth, or back.  You feel sweaty, dizzy, or light-headed.  You have chest pain or shortness of breath.  You vomit and your vomit looks like blood or coffee grounds.  Your stool is bloody or black. This information is not intended to replace advice given to you by your health care provider. Make sure you discuss any questions you have with your health care provider. Document Released: 01/01/2009 Document Revised: 01/21/2016 Document Reviewed: 12/10/2014 Elsevier Interactive Patient Education  2017  [employer]

## 2017-03-28 NOTE — Progress Notes (Signed)
BP (!) 142/68 (BP Location: Left Arm, Patient Position: Sitting, Cuff Size: Large)   Pulse 75   Temp 98.4 F (36.9 C) (Oral)   Ht 5' 6.5" (1.689 m)   Wt 297 lb (134.7 kg)   SpO2 98%   BMI 47.22 kg/m    CC: GERD Subjective:    Patient ID: [name], male    DOB: April 10, 1951, 66 y.o.   MRN: [id number]  HPI: William HuskJohn H [name] is a 66 y.o. male presenting on 03/28/2017 for Gastroesophageal Reflux   Wife passed away 7 months ago.   Ongoing heartburn, progressively worsening the last few weeks to point of needing to come in for evaluation. Describes burning at throat and upper chest. Increased clearing of throat, increased phlegm production. No water brash. No sore throat. No dysphagia, early satiety, or nausea/vomiting.   He had tried OTC omeprazole per Dr Windell HummingbirdGollan's recs without improvement.   Denies chest pain, dyspnea.  He started protonix 40mg  last night and again today. Feeling much better.  Relevant past medical, surgical, family and social history reviewed and updated as indicated. Interim medical history since our last visit reviewed. Allergies and medications reviewed and updated. Outpatient Medications Prior to Visit  Medication Sig Dispense Refill  . ALPRAZolam (XANAX) 0.5 MG tablet Take 1 tablet (0.5 mg total) by mouth 2 (two) times daily as needed for anxiety. 30 tablet 0  . aspirin 81 MG EC tablet Take 1 tablet (81 mg total) by mouth daily.    . carvedilol (COREG) 6.25 MG tablet TAKE 1 TABLET BY MOUTH ONCE DAILY 90 tablet 1  . clopidogrel (PLAVIX) 75 MG tablet Take 1 tablet (75 mg total) by mouth daily with breakfast. 90 tablet 3  . diclofenac (VOLTAREN) 75 MG EC tablet Take 1 tablet (75 mg total) by mouth 2 (two) times daily. 180 tablet 1  . furosemide (LASIX) 40 MG tablet Take 1 tablet (40 mg total) by mouth 2 (two) times daily. 60 tablet 11  . insulin NPH-regular Human (NOVOLIN 70/30 RELION) (70-30) 100 UNIT/ML injection Inject 70 units subcutaneously before breakfast  and 60 units before supper    . isosorbide mononitrate (IMDUR) 30 MG 24 hr tablet Take 1 tablet (30 mg total) by mouth 2 (two) times daily. 180 tablet 3  . losartan (COZAAR) 50 MG tablet Take 1 tablet (50 mg total) by mouth daily. 90 tablet 3  . nitroGLYCERIN (NITROSTAT) 0.4 MG SL tablet Place 1 tablet (0.4 mg total) under the tongue every 5 (five) minutes as needed for chest pain. 25 tablet 6  . ONE TOUCH ULTRA TEST test strip USE ONE STRIP TO CHECK GLUCOSE TWICE DAILY 100 each 5  . pantoprazole (PROTONIX) 40 MG tablet Take 1 tablet (40 mg total) by mouth daily. 30 tablet 3  . potassium chloride SA (K-DUR,KLOR-CON) 20 MEQ tablet TAKE 1 TABLET BY MOUTH ONCE DAILY 90 tablet 1  . pravastatin (PRAVACHOL) 40 MG tablet Take 1 tablet (40 mg total) by mouth every evening. 90 tablet 3  . PRESCRIPTION MEDICATION Eye drops - administer day before and day after eye injections or laser treatments - next office visit mid December 2017    . venlafaxine XR (EFFEXOR-XR) 37.5 MG 24 hr capsule 1 tab po daily x 1 week, increase to 2 tabs daily if tolerating 60 capsule 3   No facility-administered medications prior to visit.      Per HPI unless specifically indicated in ROS section below Review of Systems  Objective:    BP (!) 142/68 (BP Location: Left Arm, Patient Position: Sitting, Cuff Size: Large)   Pulse 75   Temp 98.4 F (36.9 C) (Oral)   Ht 5' 6.5" (1.689 m)   Wt 297 lb (134.7 kg)   SpO2 98%   BMI 47.22 kg/m   Wt Readings from Last 3 Encounters:  03/28/17 297 lb (134.7 kg)  02/28/17 (!) 300 lb 4 oz (136.2 kg)  02/21/17 298 lb 12 oz (135.5 kg)    Physical Exam  Constitutional: He appears well-developed and well-nourished. No distress.  HENT:  Mouth/Throat: Oropharynx is clear and moist. No oropharyngeal exudate.  Cardiovascular: Normal rate, regular rhythm, normal heart sounds and intact distal pulses.   No murmur heard. Pulmonary/Chest: Effort normal and breath sounds normal. No  respiratory distress. He has no wheezes. He has no rales.  Abdominal: Soft. Normal appearance and bowel sounds are normal. He exhibits no distension and no mass. There is no hepatosplenomegaly. There is no tenderness. There is no rigidity, no rebound, no guarding, no CVA tenderness and negative Murphy's sign.  Central obesity  Musculoskeletal: He exhibits no edema.  Skin: Skin is warm and dry. No rash noted.  Psychiatric: He has a normal mood and affect.  Nursing note and vitals reviewed.  Results for orders placed or performed in visit on 02/28/17  [facility] DIABETES FOOT EXAM  Result Value Ref Range   HM Diabetic Foot Exam done    Lab Results  Component Value Date   HGBA1C 8.5 (H) 02/21/2017       Assessment & Plan:   Problem List Items Addressed This Visit    GERD (gastroesophageal reflux disease)    Acute worsening over the past month - not responsive to OTC omeprazole. Doing better since addition of protonix 40mg  last night - will continue for 3 wk course then PRN. Reviewed GERD precautions as per instructions, provided with educational handout, encouraged weight loss. No red flags.          Follow up plan: Return if symptoms worsen or fail to improve.  Eustaquio BoydenJavier Magdalen Cabana, MD

## 2017-03-28 NOTE — Telephone Encounter (Signed)
Spoke to pt and confirmed appt. Pt picked up Rx and began taking last night

## 2017-03-29 ENCOUNTER — Other Ambulatory Visit: Payer: Self-pay | Admitting: Family Medicine

## 2017-04-18 DIAGNOSIS — H34812 Central retinal vein occlusion, left eye, with macular edema: Secondary | ICD-10-CM | POA: Diagnosis not present

## 2017-04-18 DIAGNOSIS — E113393 Type 2 diabetes mellitus with moderate nonproliferative diabetic retinopathy without macular edema, bilateral: Secondary | ICD-10-CM | POA: Diagnosis not present

## 2017-04-18 DIAGNOSIS — H3563 Retinal hemorrhage, bilateral: Secondary | ICD-10-CM | POA: Diagnosis not present

## 2017-04-18 DIAGNOSIS — H348112 Central retinal vein occlusion, right eye, stable: Secondary | ICD-10-CM | POA: Diagnosis not present

## 2017-04-21 ENCOUNTER — Other Ambulatory Visit: Payer: Self-pay | Admitting: *Deleted

## 2017-04-21 MED ORDER — CLOPIDOGREL BISULFATE 75 MG PO TABS: 75.0000 mg | ORAL_TABLET | Freq: Every day | ORAL | 3 refills | Status: DC

## 2017-04-25 ENCOUNTER — Other Ambulatory Visit: Payer: Self-pay | Admitting: *Deleted

## 2017-04-25 MED ORDER — ISOSORBIDE MONONITRATE ER 30 MG PO TB24: 30.0000 mg | ORAL_TABLET | Freq: Two times a day (BID) | ORAL | 3 refills | Status: DC

## 2017-05-22 ENCOUNTER — Other Ambulatory Visit: Payer: Self-pay | Admitting: Family Medicine

## 2017-05-22 MED ORDER — VENLAFAXINE HCL ER 75 MG PO CP24: 75.0000 mg | ORAL_CAPSULE | Freq: Every day | ORAL | 1 refills | Status: DC

## 2017-05-28 ENCOUNTER — Telehealth: Payer: Self-pay | Admitting: Family Medicine

## 2017-05-28 DIAGNOSIS — E1365 Other specified diabetes mellitus with hyperglycemia: Principal | ICD-10-CM

## 2017-05-28 DIAGNOSIS — E1339 Other specified diabetes mellitus with other diabetic ophthalmic complication: Secondary | ICD-10-CM

## 2017-05-28 DIAGNOSIS — IMO0002 Reserved for concepts with insufficient information to code with codable children: Secondary | ICD-10-CM

## 2017-05-28 NOTE — Telephone Encounter (Signed)
-----   Message from Alvina Chou sent at 05/22/2017 10:50 AM EDT ----- Regarding: Lab orders for Monday, 10.1.18 Lab orders for a 3 month follow up appt.

## 2017-05-29 ENCOUNTER — Other Ambulatory Visit: Payer: PPO

## 2017-06-01 ENCOUNTER — Ambulatory Visit: Payer: PPO | Admitting: Family Medicine

## 2017-06-01 DIAGNOSIS — Z0289 Encounter for other administrative examinations: Secondary | ICD-10-CM

## 2017-06-12 ENCOUNTER — Ambulatory Visit (INDEPENDENT_AMBULATORY_CARE_PROVIDER_SITE_OTHER): Payer: PPO | Admitting: Internal Medicine

## 2017-06-12 ENCOUNTER — Encounter: Payer: Self-pay | Admitting: Internal Medicine

## 2017-06-12 ENCOUNTER — Other Ambulatory Visit: Payer: Self-pay | Admitting: Internal Medicine

## 2017-06-12 ENCOUNTER — Ambulatory Visit (INDEPENDENT_AMBULATORY_CARE_PROVIDER_SITE_OTHER)
Admission: RE | Admit: 2017-06-12 | Discharge: 2017-06-12 | Disposition: A | Discharge status: Home / Self Care | Payer: PPO | Source: Ambulatory Visit | Attending: Internal Medicine | Admitting: Internal Medicine

## 2017-06-12 VITALS — BP 138/84 | HR 83 | Temp 98.2°F | Wt 295.0 lb

## 2017-06-12 DIAGNOSIS — W19XXXA Unspecified fall, initial encounter: Secondary | ICD-10-CM | POA: Diagnosis not present

## 2017-06-12 DIAGNOSIS — S6991XA Unspecified injury of right wrist, hand and finger(s), initial encounter: Secondary | ICD-10-CM | POA: Diagnosis not present

## 2017-06-12 DIAGNOSIS — M79641 Pain in right hand: Secondary | ICD-10-CM

## 2017-06-12 DIAGNOSIS — S62524A Nondisplaced fracture of distal phalanx of right thumb, initial encounter for closed fracture: Secondary | ICD-10-CM

## 2017-06-12 DIAGNOSIS — Y92009 Unspecified place in unspecified non-institutional (private) residence as the place of occurrence of the external cause: Secondary | ICD-10-CM

## 2017-06-12 NOTE — Progress Notes (Signed)
Subjective:    Patient ID: [name], male    DOB: 18-May-1951, 66 y.o.   MRN: [id number]  HPI  Pt presents to the clinic today with c/o right hand pain. He reports this started 3 days ago, after he fell while walking his dog. He reports he caught himself on his right hand. He reports his right great thumb bent backwards when he fell. He has noticed mild swelling, significant pain but no bruising. He has not taken anything OTC for his symptoms.  Review of Systems      Past Medical History:  Diagnosis Date  . Back injury 02/2002   worker's comp  . CHF (congestive heart failure) (HCC)   . Coronary artery disease, non-occlusive    a. cath 2002 with no sig CAD;  b. cath 2008 normal LM, LAD, LCx, p&dRCA 20-30%, PDA 30%; c.11/2015 NSTEMI/PCI: LM nl, LAD 95p (2.5x15 Xience DES), LCX nl, RCA 100p/m w/ L->R collats, EF 55-65% c. NSTEMI (02/2016) with no culprit leision, switched to Brilinta.  d. NSTEMI 03/2016: again, no culprit lesion and switched back to plavix 2/2 SOB with Brilnta.    . Depression   . Diabetes mellitus type 2, insulin dependent (HCC)   . Hyperlipemia   . Hypertensive heart disease   . Kidney stones   . Morbid obesity (HCC)   . Osteoarthritis   . Snoring     Current Outpatient Prescriptions  Medication Sig Dispense Refill  . ALPRAZolam (XANAX) 0.5 MG tablet Take 1 tablet (0.5 mg total) by mouth 2 (two) times daily as needed for anxiety. 30 tablet 0  . aspirin 81 MG EC tablet Take 1 tablet (81 mg total) by mouth daily.    . carvedilol (COREG) 6.25 MG tablet TAKE 1 TABLET BY MOUTH ONCE A DAY 90 tablet 3  . clopidogrel (PLAVIX) 75 MG tablet Take 1 tablet (75 mg total) by mouth daily with breakfast. 90 tablet 3  . diclofenac (VOLTAREN) 75 MG EC tablet Take 1 tablet (75 mg total) by mouth 2 (two) times daily. 180 tablet 1  . furosemide (LASIX) 40 MG tablet Take 1 tablet (40 mg total) by mouth 2 (two) times daily. 60 tablet 11  . isosorbide mononitrate (IMDUR) 30 MG 24 hr  tablet Take 1 tablet (30 mg total) by mouth 2 (two) times daily. 180 tablet 3  . losartan (COZAAR) 50 MG tablet Take 1 tablet (50 mg total) by mouth daily. 90 tablet 3  . nitroGLYCERIN (NITROSTAT) 0.4 MG SL tablet Place 1 tablet (0.4 mg total) under the tongue every 5 (five) minutes as needed for chest pain. 25 tablet 6  . NOVOLIN 70/30 (70-30) 100 UNIT/ML injection INJECT 80 UNITS SUBCUTANEOUSLY IN THE MORNING AND 70 UNITS IN THE EVENING 40 mL 11  . ONE TOUCH ULTRA TEST test strip USE ONE STRIP TO CHECK GLUCOSE TWICE DAILY 100 each 5  . pantoprazole (PROTONIX) 40 MG tablet Take 1 tablet (40 mg total) by mouth daily. 30 tablet 3  . potassium chloride SA (K-DUR,KLOR-CON) 20 MEQ tablet TAKE 1 TABLET BY MOUTH ONCE DAILY 90 tablet 1  . pravastatin (PRAVACHOL) 40 MG tablet Take 1 tablet (40 mg total) by mouth every evening. 90 tablet 3  . PRESCRIPTION MEDICATION Eye drops - administer day before and day after eye injections or laser treatments - next office visit mid December 2017    . venlafaxine XR (EFFEXOR XR) 75 MG 24 hr capsule Take 1 capsule (75 mg total) by mouth daily  with breakfast. 90 capsule 1   No current facility-administered medications for this visit.     Allergies  Allergen Reactions  . Atorvastatin Other (See Comments)    Body aches    Family History  Problem Relation Age of Onset  . Alzheimer's disease Mother   . Emphysema Mother   . Diabetes Father   . Heart disease Father        MI  . Cancer Brother        ? Neck cancer    Social History   Social History  . Marital status: Widowed    Spouse name: N/A  . Number of children: N/A  . Years of education: N/A   Occupational History  . disabled Unemployed    back injury   Social History Main Topics  . Smoking status: Never Smoker  . Smokeless tobacco: Never Used  . Alcohol use No  . Drug use: No  . Sexual activity: Not on file   Other Topics Concern  . Not on file   Social History Narrative   Financial  concerns, wife with depression.   Minimal exercise.   Diet: poor.     Constitutional: Denies fever, malaise, fatigue, headache or abrupt weight changes.  Musculoskeletal: Pt reports right hand pain. Denies difficulty with gait, muscle pain.    No other specific complaints in a complete review of systems (except as listed in HPI above).  Objective:   Physical Exam  BP 138/84   Pulse 83   Temp 98.2 F (36.8 C) (Oral)   Wt 295 lb (133.8 kg)   SpO2 98%   BMI 46.90 kg/m  Wt Readings from Last 3 Encounters:  06/12/17 295 lb (133.8 kg)  03/28/17 297 lb (134.7 kg)  02/28/17 (!) 300 lb 4 oz (136.2 kg)    General: Appears his stated age, well developed, well nourished in NAD. Musculoskeletal: Normal flexion and extension of the right wrist. Pain with rotation of the right wrist. No pain with palpation of the radius. He has pain with palpation over the right posterior lateral carpals. Pain with palpation of the right thumb. He has pain with flexion and extension of the thumb.   BMET    Component Value Date/Time   NA 145 10/06/2016 0846   K 4.1 10/06/2016 0846   CL 107 10/06/2016 0846   CO2 35 (H) 10/06/2016 0846   GLUCOSE 148 (H) 10/06/2016 0846   BUN 19 10/06/2016 0846   CREATININE 1.03 10/06/2016 0846   CALCIUM 9.4 10/06/2016 0846   GFRNONAA >60 09/21/2016 0502   GFRAA >60 09/21/2016 0502    Lipid Panel     Component Value Date/Time   CHOL 150 02/21/2017 1357   TRIG 167.0 (H) 02/21/2017 1357   HDL 29.80 (L) 02/21/2017 1357   CHOLHDL 5 02/21/2017 1357   VLDL 33.4 02/21/2017 1357   LDLCALC 87 02/21/2017 1357    CBC    Component Value Date/Time   WBC 5.8 09/21/2016 0502   RBC 4.23 (L) 09/21/2016 0502   HGB 12.6 (L) 09/21/2016 0502   HCT 37.3 (L) 09/21/2016 0502   PLT 203 09/21/2016 0502   MCV 88.0 09/21/2016 0502   MCH 29.7 09/21/2016 0502   MCHC 33.7 09/21/2016 0502   RDW 15.7 (H) 09/21/2016 0502   LYMPHSABS 1.1 03/09/2016 0837   MONOABS 0.4 03/09/2016 0837    EOSABS 0.3 03/09/2016 0837   BASOSABS 0.0 03/09/2016 0837    Hgb A1C Lab Results  Component Value Date  HGBA1C 8.5 (H) 02/21/2017            Assessment & Plan:   Right Hand Pain s/p Fall:  Xray of right hand today Right hand placed in ACE wrap Encouraged ice, elevation Tylenol 1000 mg Q8H prn  Will follow up after xray is back, return precautions discussed Nicki Reaper, NP

## 2017-06-12 NOTE — Patient Instructions (Signed)
Elastic Bandage and RICE What does an elastic bandage do? Elastic bandages come in different shapes and sizes. They generally provide support to your injury and reduce swelling while you are healing, but they can perform different functions. Your health care provider will help you to decide what is best for your protection, recovery, or rehabilitation following an injury. What are some general tips for using an elastic bandage?  Use the bandage as directed by the maker of the bandage that you are using.  Do not wrap the bandage too tightly. This may cut off the circulation in the arm or leg in the area below the bandage. ? If part of your body beyond the bandage becomes blue, numb, cold, swollen, or is more painful, your bandage is most likely too tight. If this occurs, remove your bandage and reapply it more loosely.  See your health care provider if the bandage seems to be making your problems worse rather than better.  An elastic bandage should be removed and reapplied every 3-4 hours or as directed by your health care provider. What is RICE? The routine care of many injuries includes rest, ice, compression, and elevation (RICE therapy). Rest Rest is required to allow your body to heal. Generally, you can resume your routine activities when you are comfortable and have been given permission by your health care provider. Ice Icing your injury helps to keep the swelling down and it reduces pain. Do not apply ice directly to your skin.  Put ice in a plastic bag.  Place a towel between your skin and the bag.  Leave the ice on for 20 minutes, 2-3 times per day.  Do this for as long as you are directed by your health care provider. Compression Compression helps to keep swelling down, gives support, and helps with discomfort. Compression may be done with an elastic bandage. Elevation Elevation helps to reduce swelling and it decreases pain. If possible, your injured area should be placed at  or above the level of your heart or the center of your chest. When should I seek medical care? You should seek medical care if:  You have persistent pain and swelling.  Your symptoms are getting worse rather than improving.  These symptoms may indicate that further evaluation or further X-rays are needed. Sometimes, X-rays may not show a small broken bone (fracture) until a number of days later. Make a follow-up appointment with your health care provider. Ask when your X-ray results will be ready. Make sure that you get your X-ray results. When should I seek immediate medical care? You should seek immediate medical care if:  You have a sudden onset of severe pain at or below the area of your injury.  You develop redness or increased swelling around your injury.  You have tingling or numbness at or below the area of your injury that does not improve after you remove the elastic bandage.  This information is not intended to replace advice given to you by your health care provider. Make sure you discuss any questions you have with your health care provider. Document Released: 02/04/2002 Document Revised: 07/11/2016 Document Reviewed: 03/31/2014 Elsevier Interactive Patient Education  2017 [employer]  

## 2017-06-14 DIAGNOSIS — M25531 Pain in right wrist: Secondary | ICD-10-CM | POA: Diagnosis not present

## 2017-06-20 DIAGNOSIS — H34812 Central retinal vein occlusion, left eye, with macular edema: Secondary | ICD-10-CM | POA: Diagnosis not present

## 2017-06-20 DIAGNOSIS — E113393 Type 2 diabetes mellitus with moderate nonproliferative diabetic retinopathy without macular edema, bilateral: Secondary | ICD-10-CM | POA: Diagnosis not present

## 2017-06-20 DIAGNOSIS — H348112 Central retinal vein occlusion, right eye, stable: Secondary | ICD-10-CM | POA: Diagnosis not present

## 2017-06-20 DIAGNOSIS — H3582 Retinal ischemia: Secondary | ICD-10-CM | POA: Diagnosis not present

## 2017-07-04 DIAGNOSIS — E113392 Type 2 diabetes mellitus with moderate nonproliferative diabetic retinopathy without macular edema, left eye: Secondary | ICD-10-CM | POA: Diagnosis not present

## 2017-07-05 DIAGNOSIS — M1811 Unilateral primary osteoarthritis of first carpometacarpal joint, right hand: Secondary | ICD-10-CM | POA: Diagnosis not present

## 2017-07-07 ENCOUNTER — Other Ambulatory Visit: Payer: Self-pay | Admitting: Cardiovascular Disease

## 2017-07-19 ENCOUNTER — Other Ambulatory Visit: Payer: Self-pay | Admitting: Family Medicine

## 2017-08-01 ENCOUNTER — Telehealth: Payer: Self-pay | Admitting: *Deleted

## 2017-08-01 NOTE — Telephone Encounter (Signed)
Received fax from [employer] requesting order for diabetic testing supplies.  Spoke with [name] who confirmed he would like to get supplies from this company.  Form completed and faxed back to [employer] at [phone].

## 2017-08-04 DIAGNOSIS — H348112 Central retinal vein occlusion, right eye, stable: Secondary | ICD-10-CM | POA: Diagnosis not present

## 2017-08-04 DIAGNOSIS — H348122 Central retinal vein occlusion, left eye, stable: Secondary | ICD-10-CM | POA: Diagnosis not present

## 2017-08-04 DIAGNOSIS — E113393 Type 2 diabetes mellitus with moderate nonproliferative diabetic retinopathy without macular edema, bilateral: Secondary | ICD-10-CM | POA: Diagnosis not present

## 2017-08-15 IMAGING — DX DG CHEST 2V
2 series · 2 of 2 positions shown · non-contrast
Comparison: 05/01/2016

CLINICAL DATA: Acute onset chest pain and shortness of breath
today. Previous myocardial infarcts.

EXAM:
CHEST  2 VIEW

[chest pa]
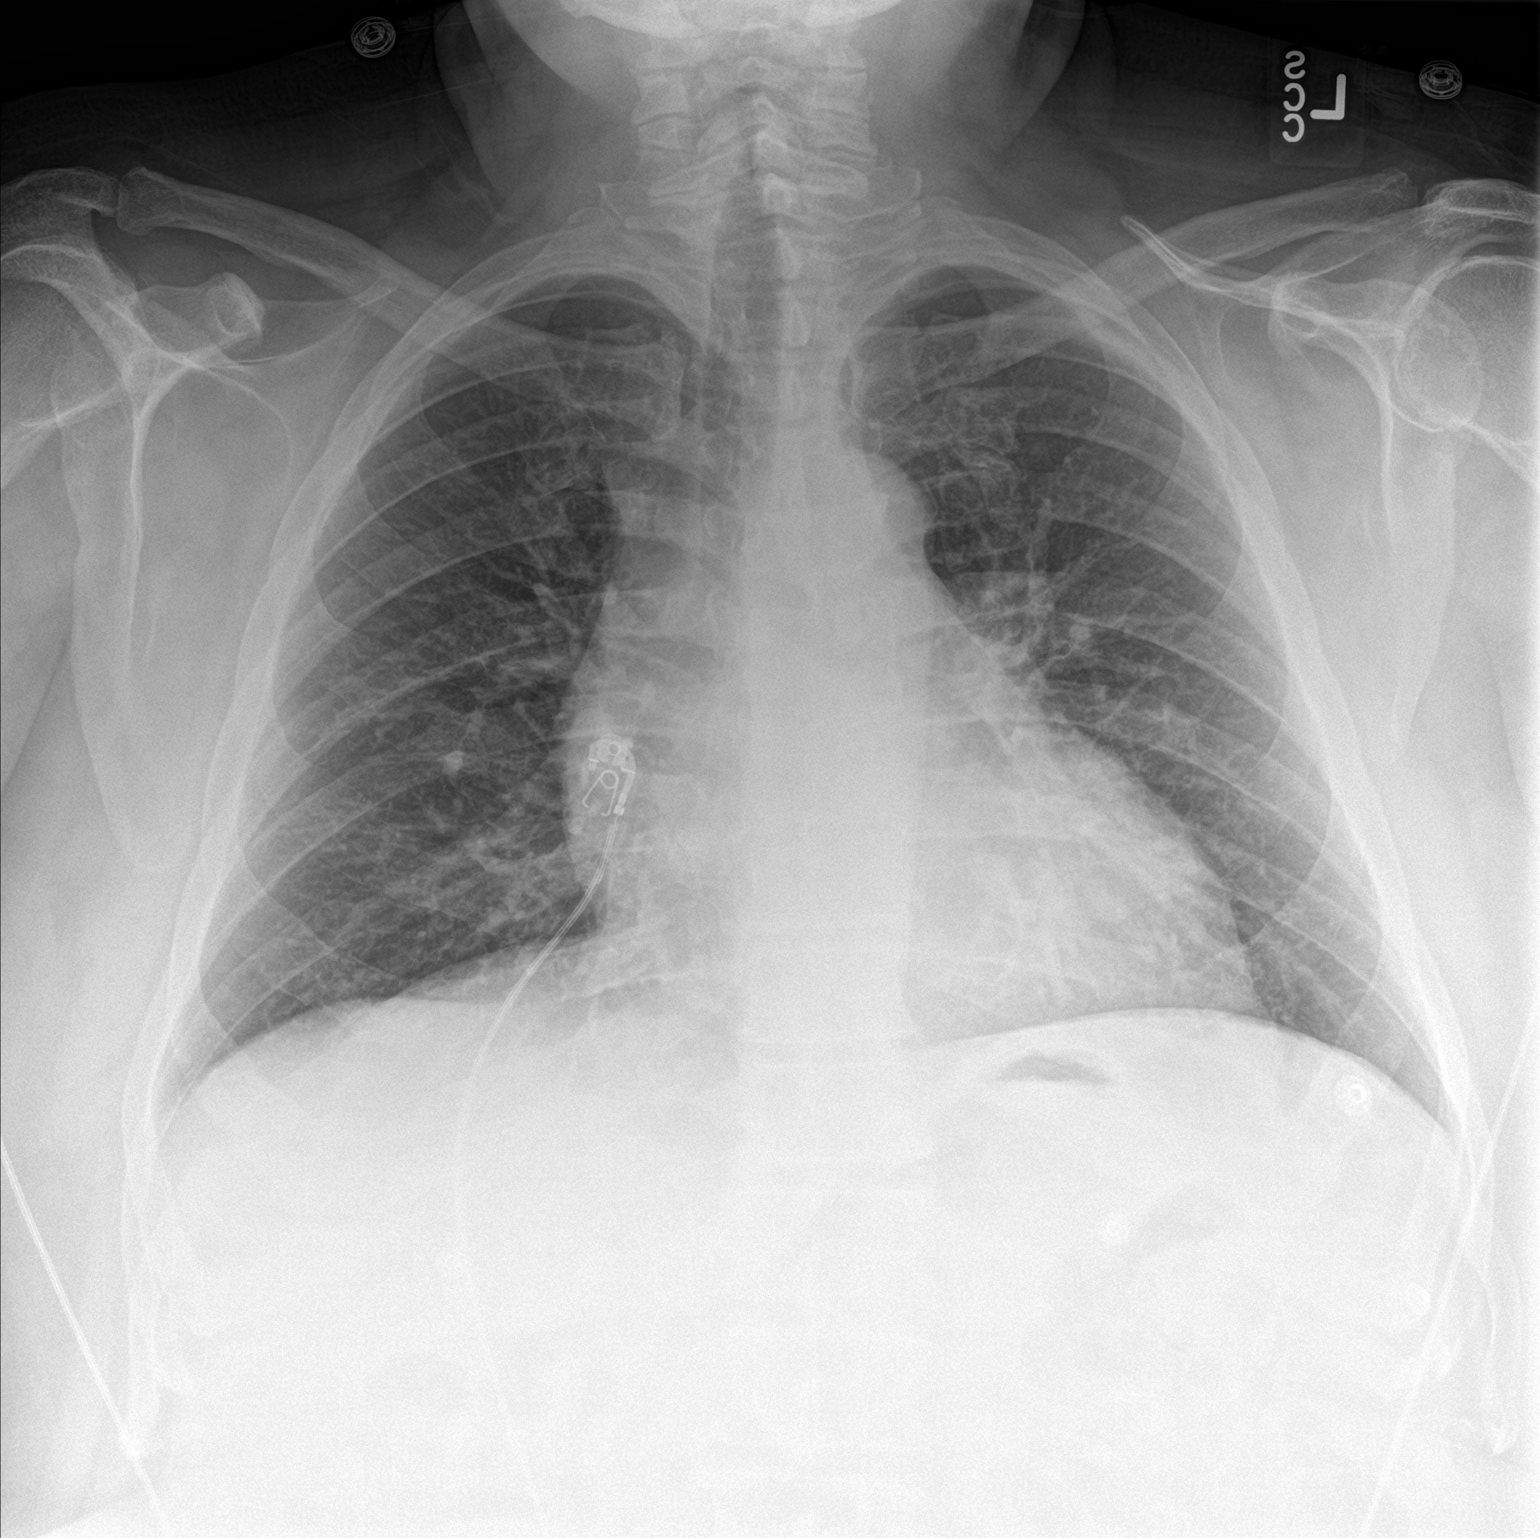

[chest lat]
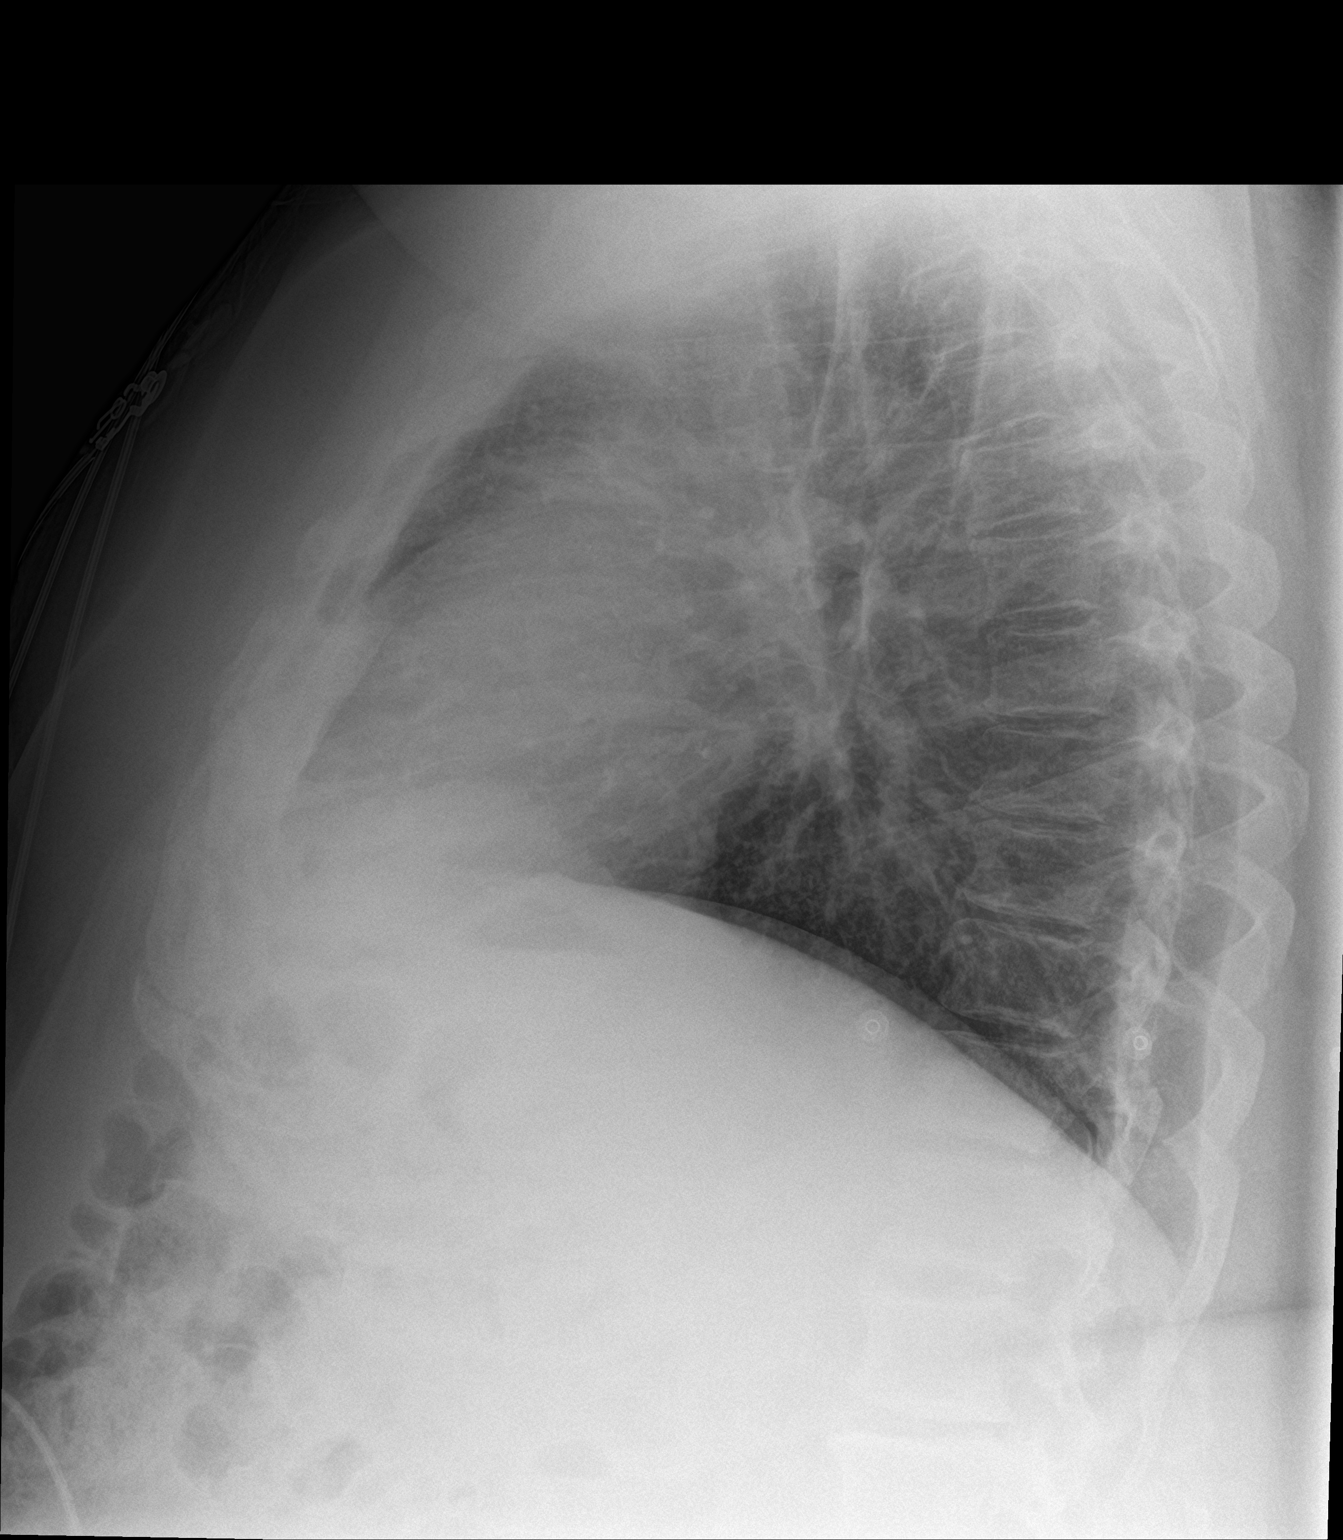

[2 of 2 positions shown; findings below may reference images not displayed]

FINDINGS: The heart size and mediastinal contours are within normal limits.
Both lungs are clear. The visualized skeletal structures are
unremarkable.
IMPRESSION: No active cardiopulmonary disease.

## 2017-09-19 ENCOUNTER — Other Ambulatory Visit: Payer: Self-pay | Admitting: Family Medicine

## 2017-10-19 ENCOUNTER — Telehealth: Payer: Self-pay | Admitting: Cardiovascular Disease

## 2017-10-19 ENCOUNTER — Ambulatory Visit (INDEPENDENT_AMBULATORY_CARE_PROVIDER_SITE_OTHER): Payer: Medicare Other | Admitting: Family Medicine

## 2017-10-19 ENCOUNTER — Encounter: Payer: Self-pay | Admitting: Family Medicine

## 2017-10-19 ENCOUNTER — Other Ambulatory Visit: Payer: Self-pay

## 2017-10-19 VITALS — BP 140/70 | HR 74 | Temp 98.8°F | Ht 66.5 in | Wt 293.8 lb

## 2017-10-19 DIAGNOSIS — Z8719 Personal history of other diseases of the digestive system: Secondary | ICD-10-CM | POA: Diagnosis not present

## 2017-10-19 DIAGNOSIS — E1365 Other specified diabetes mellitus with hyperglycemia: Secondary | ICD-10-CM | POA: Diagnosis not present

## 2017-10-19 DIAGNOSIS — I5032 Chronic diastolic (congestive) heart failure: Secondary | ICD-10-CM

## 2017-10-19 DIAGNOSIS — I25118 Atherosclerotic heart disease of native coronary artery with other forms of angina pectoris: Secondary | ICD-10-CM | POA: Diagnosis not present

## 2017-10-19 DIAGNOSIS — E113393 Type 2 diabetes mellitus with moderate nonproliferative diabetic retinopathy without macular edema, bilateral: Secondary | ICD-10-CM

## 2017-10-19 DIAGNOSIS — I10 Essential (primary) hypertension: Secondary | ICD-10-CM

## 2017-10-19 DIAGNOSIS — G8929 Other chronic pain: Secondary | ICD-10-CM | POA: Diagnosis not present

## 2017-10-19 DIAGNOSIS — E785 Hyperlipidemia, unspecified: Secondary | ICD-10-CM | POA: Diagnosis not present

## 2017-10-19 DIAGNOSIS — E1339 Other specified diabetes mellitus with other diabetic ophthalmic complication: Secondary | ICD-10-CM

## 2017-10-19 DIAGNOSIS — R5383 Other fatigue: Secondary | ICD-10-CM | POA: Diagnosis not present

## 2017-10-19 DIAGNOSIS — R079 Chest pain, unspecified: Secondary | ICD-10-CM

## 2017-10-19 DIAGNOSIS — IMO0002 Reserved for concepts with insufficient information to code with codable children: Secondary | ICD-10-CM

## 2017-10-19 DIAGNOSIS — K219 Gastro-esophageal reflux disease without esophagitis: Secondary | ICD-10-CM

## 2017-10-19 LAB — HM DIABETES FOOT EXAM

## 2017-10-19 NOTE — Patient Instructions (Addendum)
Please stop at the lab to have labs drawn.  We may need to increase depression medication. Call to set up appt to set up early follow up with cardiology given possible progressive angina. Continue pantoprazole but add ranitidine ( zantac) 150 mg  twice daily. Please stop at the front desk to set up referral to GI for consideration of GI source of worsening chest pain.

## 2017-10-19 NOTE — Telephone Encounter (Signed)
Pt c/o of Chest Pain: STAT if CP now or developed within 24 hours  1. Are you having CP right now? no   2. Are you experiencing any other symptoms (ex. SOB, nausea, vomiting, sweating)? Pain between breast, sweating sometimes  3. How long have you been experiencing CP? 2-3 weeks  4. Is your CP continuous or coming and going? Comes and goes  5. Have you taken Nitroglycerin? "A couple of times" within a week and a half ago. Seems to be getting worse. ?

## 2017-10-19 NOTE — Telephone Encounter (Signed)
S/w patient. He is not having chest pain or discomfort at this time. He saw PCP today who recommended he call us for evaluation. He's going to be set up with GI as well. He routinely has chest discomfort after eating and even from walking to the mailbox. Sometimes the pain wakes him from sleep. He does not get shortness of breath. Sometimes he sweats. He is concerned about this. Patient scheduled to see Dr Mariah MillingGollan tomorrow at 4:20pm. Pt verbalized understanding to call 911 or go to the emergency room, if he develops any new or worsening symptoms.

## 2017-10-19 NOTE — Progress Notes (Signed)
Subjective:    Patient ID: [name], male    DOB: 05/17/1951, 67 y.o.   MRN: [id number]  HPI   67 year old male who is overdue for HTN,DM, CHF and cholesterol eval presents for follow up but today he states he has an acute issue... Progressive worsening of  chest pain  after eating and  with walking to mailbox.. Worse in last few weeks.. Occ waking up in middle of night with pain.  Improves after sitting down. Nitroglycerin helps with the pain as well. Occ painful to touch.  No heartburn, no burping. Occ SOB with the chest pain.   No increase in swelling.  He continue to have chronic fatigue... Worse in last 3-4 months.  He does have history of chronic chest pain/angina.. On imdur.    Wt Readings from Last 3 Encounters:  10/19/17 293 lb 12 oz (133.2 kg)  06/12/17 295 lb (133.8 kg)  03/28/17 297 lb (134.7 kg)     Decreased motivation, decreased anhedonia last few months.  On effexor daily.  Increased fatigue.  Poor sleep at  Night, mid racing.  He is high risk for MI given past CAD with angina, DM, obesity, high cholesterol and  Family history as well as noncompliance with treatments.  Last OV with cardiology 01/2017   HtN.. borderline good control on  Coreg.losartan  He is not taking his pravastatin for cholesterol given SE.  He is on pantoprazole for GERD.  Diabetes:   Overdue for lab eval.  On novolin 70/30 units AM 40 units PM he is open to chaning to a different insulin regimen. Lab Results  Component Value Date   HGBA1C 8.5 (H) 02/21/2017  Using medications without difficulties: Hypoglycemic episodes: none Hyperglycemic episodes:none Feet problems: no ulcers  Blood Sugars averaging: FBS 130,  Not checking regularly. eye exam within last year: yes   Overdue for cholesterol eval.  Lab Results  Component Value Date   CHOL 150 02/21/2017   HDL 29.80 (L) 02/21/2017   LDLCALC 87 02/21/2017   LDLDIRECT 186.9 05/31/2013   TRIG 167.0 (H) 02/21/2017   CHOLHDL 5  02/21/2017     Social History /Family History/Past Medical History reviewed in detail and updated in EMR if needed. Blood pressure 140/70, pulse 74, temperature 98.8 F (37.1 C), temperature source Oral, height 5' 6.5" (1.689 m), weight 293 lb 12 oz (133.2 kg).4  EKG: stableEKG, with nospecific t wave changes and Q wave normal sinus rhythm, unchanged from previous tracings in 01/2017.   Review of Systems  Constitutional: Positive for fatigue. Negative for fever.  HENT: Negative for ear pain.   Eyes: Negative for pain.  Respiratory: Negative for cough and shortness of breath.   Cardiovascular: Negative for palpitations and leg swelling.  Gastrointestinal: Negative for abdominal pain.  Genitourinary: Negative for dysuria.  Musculoskeletal: Negative for arthralgias.  Neurological: Negative for syncope, light-headedness and headaches.  Psychiatric/Behavioral: Negative for dysphoric mood.       Objective:   Physical Exam  Constitutional: He is oriented to person, place, and time. Vital signs are normal. He appears well-developed and well-nourished.  Morbid obesity   HENT:  Head: Normocephalic.  Right Ear: Hearing normal.  Left Ear: Hearing normal.  Nose: Nose normal.  Mouth/Throat: Oropharynx is clear and moist and mucous membranes are normal.  Neck: Trachea normal. Carotid bruit is not present. No thyroid mass and no thyromegaly present.  Cardiovascular: Normal rate, regular rhythm and normal pulses. Exam reveals no gallop, no distant heart  sounds and no friction rub.  No murmur heard. No peripheral edema  Pulmonary/Chest: Effort normal and breath sounds normal. No respiratory distress.  Neurological: He is alert and oriented to person, place, and time.  Skin: Skin is warm, dry and intact. No rash noted.  Psychiatric: His speech is normal. Judgment and thought content normal. His affect is blunt. He is withdrawn. Cognition and memory are normal. He exhibits a depressed mood.      Diabetic foot exam: Normal inspection No skin breakdown No calluses  Normal DP pulses Normal sensation to light touch and monofilament Nails normal      Assessment & Plan:

## 2017-10-20 ENCOUNTER — Encounter: Payer: Self-pay | Admitting: Gastroenterology

## 2017-10-20 ENCOUNTER — Ambulatory Visit: Payer: Medicare Other | Admitting: Cardiovascular Disease

## 2017-10-20 ENCOUNTER — Encounter: Payer: Self-pay | Admitting: Cardiovascular Disease

## 2017-10-20 ENCOUNTER — Other Ambulatory Visit: Payer: Self-pay | Admitting: Cardiovascular Disease

## 2017-10-20 VITALS — BP 136/60 | HR 73 | Ht 66.0 in | Wt 293.0 lb

## 2017-10-20 DIAGNOSIS — I214 Non-ST elevation (NSTEMI) myocardial infarction: Secondary | ICD-10-CM | POA: Diagnosis not present

## 2017-10-20 DIAGNOSIS — I2510 Atherosclerotic heart disease of native coronary artery without angina pectoris: Secondary | ICD-10-CM | POA: Diagnosis not present

## 2017-10-20 DIAGNOSIS — IMO0002 Reserved for concepts with insufficient information to code with codable children: Secondary | ICD-10-CM

## 2017-10-20 DIAGNOSIS — R079 Chest pain, unspecified: Secondary | ICD-10-CM | POA: Diagnosis not present

## 2017-10-20 DIAGNOSIS — I5032 Chronic diastolic (congestive) heart failure: Secondary | ICD-10-CM

## 2017-10-20 DIAGNOSIS — I25118 Atherosclerotic heart disease of native coronary artery with other forms of angina pectoris: Secondary | ICD-10-CM | POA: Diagnosis not present

## 2017-10-20 DIAGNOSIS — E1339 Other specified diabetes mellitus with other diabetic ophthalmic complication: Secondary | ICD-10-CM

## 2017-10-20 DIAGNOSIS — E1365 Other specified diabetes mellitus with hyperglycemia: Secondary | ICD-10-CM

## 2017-10-20 DIAGNOSIS — I10 Essential (primary) hypertension: Secondary | ICD-10-CM

## 2017-10-20 LAB — COMPREHENSIVE METABOLIC PANEL
ALT: 17 U/L (ref 0–53)
AST: 15 U/L (ref 0–37)
Albumin: 4.1 g/dL (ref 3.5–5.2)
Alkaline Phosphatase: 94 U/L (ref 39–117)
BUN: 14 mg/dL (ref 6–23)
CO2: 29 mEq/L (ref 19–32)
Calcium: 9.8 mg/dL (ref 8.4–10.5)
Chloride: 102 mEq/L (ref 96–112)
Creatinine, Ser: 1.03 mg/dL (ref 0.40–1.50)
GFR: 76.34 mL/min (ref >60.00)
Glucose, Bld: 299 mg/dL — ABNORMAL HIGH (ref 70–99)
Potassium: 4.5 mEq/L (ref 3.5–5.1)
Sodium: 140 mEq/L (ref 135–145)
Total Bilirubin: 0.4 mg/dL (ref 0.2–1.2)
Total Protein: 7.5 g/dL (ref 6.0–8.3)

## 2017-10-20 LAB — CBC WITH DIFFERENTIAL/PLATELET
Basophils Absolute: 0.1 10*3/uL (ref 0.0–0.1)
Basophils Relative: 1.2 % (ref 0.0–3.0)
Eosinophils Absolute: 0.2 10*3/uL (ref 0.0–0.7)
Eosinophils Relative: 3 % (ref 0.0–5.0)
HCT: 42.2 % (ref 39.0–52.0)
Hemoglobin: 14.2 g/dL (ref 13.0–17.0)
Lymphocytes Relative: 26.1 % (ref 12.0–46.0)
Lymphs Abs: 1.9 10*3/uL (ref 0.7–4.0)
MCHC: 33.7 g/dL (ref 30.0–36.0)
MCV: 91.1 fl (ref 78.0–100.0)
Monocytes Absolute: 0.6 10*3/uL (ref 0.1–1.0)
Monocytes Relative: 7.8 % (ref 3.0–12.0)
Neutro Abs: 4.4 10*3/uL (ref 1.4–7.7)
Neutrophils Relative %: 61.9 % (ref 43.0–77.0)
Platelets: 234 10*3/uL (ref 150.0–400.0)
RBC: 4.63 Mil/uL (ref 4.22–5.81)
RDW: 14.8 % (ref 11.5–15.5)
WBC: 7.2 10*3/uL (ref 4.0–10.5)

## 2017-10-20 LAB — LIPID PANEL
Cholesterol: 246 mg/dL — ABNORMAL HIGH (ref 0–200)
HDL: 36.4 mg/dL — ABNORMAL LOW (ref >39.00)
NonHDL: 209.3
Total CHOL/HDL Ratio: 7
Triglycerides: 214 mg/dL — ABNORMAL HIGH (ref 0.0–149.0)
VLDL: 42.8 mg/dL — ABNORMAL HIGH (ref 0.0–40.0)

## 2017-10-20 LAB — VITAMIN D 25 HYDROXY (VIT D DEFICIENCY, FRACTURES): VITD: 27.63 ng/mL — ABNORMAL LOW (ref 30.00–100.00)

## 2017-10-20 LAB — LDL CHOLESTEROL, DIRECT: Direct LDL: 174 mg/dL

## 2017-10-20 LAB — TESTOSTERONE: Testosterone: 353.34 ng/dL (ref 300.00–890.00)

## 2017-10-20 LAB — T3, FREE: T3, Free: 3.7 pg/mL (ref 2.3–4.2)

## 2017-10-20 LAB — HEMOGLOBIN A1C: Hgb A1c MFr Bld: 10 % — ABNORMAL HIGH (ref 4.6–6.5)

## 2017-10-20 LAB — VITAMIN B12: Vitamin B-12: 1287 pg/mL — ABNORMAL HIGH (ref 211–911)

## 2017-10-20 LAB — TSH: TSH: 2.64 u[IU]/mL (ref 0.35–4.50)

## 2017-10-20 LAB — T4, FREE: Free T4: 0.79 ng/dL (ref 0.60–1.60)

## 2017-10-20 MED ORDER — EZETIMIBE 10 MG PO TABS: 10.0000 mg | ORAL_TABLET | Freq: Every day | ORAL | 3 refills | Status: DC

## 2017-10-20 MED ORDER — ROSUVASTATIN CALCIUM 40 MG PO TABS: 40.0000 mg | ORAL_TABLET | Freq: Every day | ORAL | 3 refills | Status: DC

## 2017-10-20 MED ORDER — RANITIDINE HCL 150 MG PO TABS: 150.0000 mg | ORAL_TABLET | Freq: Two times a day (BID) | ORAL | 3 refills | Status: DC

## 2017-10-20 NOTE — Patient Instructions (Addendum)
Medication Instructions:   No medication changes made  Labwork:  No new labs needed  Testing/Procedures:  We will schedule a lexiscan myoview for angina/chest pain, known CAD, unable to treadmill [facility]  Your caregiver has ordered a Stress Test with nuclear imaging. The purpose of this test is to evaluate the blood supply to your heart muscle. This procedure is referred to as a "Non-Invasive Stress Test." This is because other than having an IV started in your vein, nothing is inserted or "invades" your body. Cardiac stress tests are done to find areas of poor blood flow to the heart by determining the extent of coronary artery disease (CAD). Some patients exercise on a treadmill, which naturally increases the blood flow to your heart, while others who are  unable to walk on a treadmill due to physical limitations have a pharmacologic/chemical stress agent called Lexiscan . This medicine will mimic walking on a treadmill by temporarily increasing your coronary blood flow.   Please note: these test may take anywhere between 2-4 hours to complete  PLEASE REPORT TO [facility] ENTRANCE  THE VOLUNTEERS AT THE FIRST DESK WILL DIRECT YOU WHERE TO GO  Date of Procedure:_____Wednesday, Feb 27______  Arrival Time for Procedure:_____9:15 am_________  Instructions regarding medication:   __X__ : Hold diabetes medication morning of procedure  __X__:  Hold CARVEDILOL the night before and morning of procedure   How to prepare for your Myoview test:  1. Do not eat or drink after midnight 2. No caffeine for 24 hours prior to test 3. No smoking 24 hours prior to test. 4. Your medication may be taken with water.  If your doctor stopped a medication because of this test, do not take that medication. 5. Ladies, please do not wear dresses.  Skirts or pants are appropriate. Please wear a short sleeve shirt. 6. No perfume, cologne or lotion.  Follow-Up: It was a pleasure seeing you in  the office today. Please call us if you have new issues that need to be addressed before your next appt.  [phone]  Your physician wants you to follow-up in: 12 months.  You will receive a reminder letter in the mail two months in advance. If you don't receive a letter, please call our office to schedule the follow-up appointment.  If you need a refill on your cardiac medications before your next appointment, please call your pharmacy.  For educational health videos Log in to : [email or web address] Or : [email or web address], password : [email or web address] Cardiac Nuclear Scan A cardiac nuclear scan is a test that measures blood flow to the heart when a person is resting and when he or she is exercising. The test looks for problems such as:  Not enough blood reaching a portion of the heart.  The heart muscle not working normally.  You may need this test if:  You have heart disease.  You have had abnormal lab results.  You have had heart surgery or angioplasty.  You have chest pain.  You have shortness of breath.  In this test, a radioactive dye (tracer) is injected into your bloodstream. After the tracer has traveled to your heart, an imaging device is used to measure how much of the tracer is absorbed by or distributed to various areas of your heart. This procedure is usually done at a hospital and takes 2-4 hours. Tell a health care provider about:  Any allergies you have.  All medicines you are taking, including vitamins, herbs, eye drops, creams, and over-the-counter  medicines.  Any problems you or family members have had with the use of anesthetic medicines.  Any blood disorders you have.  Any surgeries you have had.  Any medical conditions you have.  Whether you are pregnant or may be pregnant. What are the risks? Generally, this is a safe procedure. However, problems may occur, including:  Serious chest pain and heart attack. This is only a risk if the stress portion of the test  is done.  Rapid heartbeat.  Sensation of warmth in your chest. This usually passes quickly.  What happens before the procedure?  Ask your health care provider about changing or stopping your regular medicines. This is especially important if you are taking diabetes medicines or blood thinners.  Remove your jewelry on the day of the procedure. What happens during the procedure?  An IV tube will be inserted into one of your veins.  Your health care provider will inject a small amount of radioactive tracer through the tube.  You will wait for 20-40 minutes while the tracer travels through your bloodstream.  Your heart activity will be monitored with an electrocardiogram (ECG).  You will lie down on an exam table.  Images of your heart will be taken for about 15-20 minutes.  You may be asked to exercise on a treadmill or stationary bike. While you exercise, your heart's activity will be monitored with an ECG, and your blood pressure will be checked. If you are unable to exercise, you may be given a medicine to increase blood flow to parts of your heart.  When blood flow to your heart has peaked, a tracer will again be injected through the IV tube.  After 20-40 minutes, you will get back on the exam table and have more images taken of your heart.  When the procedure is over, your IV tube will be removed. The procedure may vary among health care providers and hospitals. Depending on the type of tracer used, scans may need to be repeated 3-4 hours later. What happens after the procedure?  Unless your health care provider tells you otherwise, you may return to your normal schedule, including diet, activities, and medicines.  Unless your health care provider tells you otherwise, you may increase your fluid intake. This will help flush the contrast dye from your body. Drink enough fluid to keep your urine clear or pale yellow.  It is up to you to get your test results. Ask your health  care provider, or the department that is doing the test, when your results will be ready. Summary  A cardiac nuclear scan measures the blood flow to the heart when a person is resting and when he or she is exercising.  You may need this test if you are at risk for heart disease.  Tell your health care provider if you are pregnant.  Unless your health care provider tells you otherwise, increase your fluid intake. This will help flush the contrast dye from your body. Drink enough fluid to keep your urine clear or pale yellow. This information is not intended to replace advice given to you by your health care provider. Make sure you discuss any questions you have with your health care provider. Document Released: 09/09/2004 Document Revised: 08/17/2016 Document Reviewed: 07/24/2013 Elsevier Interactive Patient Education  2017 [employer].

## 2017-10-20 NOTE — Progress Notes (Signed)
Cardiology Office Note  Date:  10/20/2017   ID:  MALVERN KADLEC, DOB May 06, 1951, MRN [id number]  PCP:  Jinny Sanders, MD   Chief Complaint  Patient presents with  . Other    Referred by bedsole for Chest discomfort. Patient c/o chest discomfort and thinks it is from his Acid reflux. Meds reviewed verbally with patient.     HPI:  NAVRAJ DREIBELBIS a 67 y.o.malewith a history of morbid obesity,  HTN,  DM,  CAD  NSTEMI in 11/2015 (PCI to LAD; was discharged on Plavix) and again 02/2016 (no PCI; was discharged on Brilinta), Again presented to [facility] ER on 04/05/16 for NSTEMI (no PCI),  catheterization 04/06/2016, stable LAD stent, occluded RCA with collaterals Chronic total occlusion of RCA EF 55 to 60% Chronic chest pain syndrome GERD Who presents for follow-up of his coronary artery disease  In follow-up today he reports that he has had continued problems with depression Sitting around for weeks on end, not doing anything Weight continues to run high, close to 300 pounds  Long history of dietary noncompliance , high soda intake Works part time chicken farm, 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 Depression stems from loss of his wife 15 months ago Difficulty adjusting still, not sleeping well  Long history of atypical chest pain, Numerous cardiac catheterizations including July 2017, August 2017, treated medically  Currently on proton pump inhibitor H2 blocker added in the past day or so Thinks it might be helping his chest pain  Chest pain sometimes with exertion, often at rest Sometimes with food, sometimes tender with palpation Will hurt in bed, sometimes better when he sits up Thinks his bed is like a hammock, does not get chest pain when he sleeps in the middle Sometimes nitroglycerin  will help  EKG personally reviewed by myself on todays visit Shows normal sinus rhythm rate 72 bpm no significant ST or T-wave changes  Other past medical history reviewed hospital admission with discharge  on 09/21/2016 for acute pulmonary edema, acute on chronic CHF Significant dietary noncompliance, drinks a lot of liquid Was drinking green tea, bottles of diet Coke  Presented to the hospital with shortness of breath and weight gain Poor functional status at baseline Was discharged on only Lasix 40 mg daily  07/03/2016 reports that even relatively active, mowing all day on zero turn mower, Then lifted a heavy toilet for the church Cutback into his truck, developed acute onset chest pain Took nitroglycerin, aspirin Went to the ER 07/03/16 Symptoms resolved on the way EKG unchanged, "was not like a heart attack" BP went up in the setting of pain  Continues to have chronic fatigue Mild snoring, not bad Will sleep for 3 hours, then wakes up, sits in the living room for 40 minutes then goes back to bed  Previously had problems on brilinta, this caused shortness of breath  Cath 04/06/16 EF 50%  Chronic total occlusion of the right coronary in the proximal segment well collateralized from the left circumflex and LAD.  Widely patent proximal to mid LAD stent previously placed in July. There is first diagonal diagonal and LAD 30 and 50% narrowing respectively.  Widely patent circumflex unchanged from previous with 70% narrowing in a small branch of the first marginal. Circumflex collateralizes the distal right coronary left ventricular branch.  Inferobasal hypokinesis. EF 50%. EDP is normal.   admitted to [facility] in late April 2017 with a two-month history of progressive exertional chest discomfort. ruled in for non-STEMI and subsequent underwent  catheterization revealing a chronic total occlusion of the right coronary artery with severe proximal LAD stenosis. The LAD was felt to be the culprit vessel and this was successfully stented using a drug-eluting stent. He was placed on ASA and plavix.  admitted again in 02/2016 for NSTEMI but no culprit found on cath 03/10/16. Felt  to be possibly demand ischemia in the setting of the patient's chronically occluded RCA with underlying hypertensive heart disease and moderately elevated LVEDP. He was switched to Brilinta.   re-presented to [facility] on 04/05/16 with recurrent chest pain.    PMH:   has a past medical history of Back injury (02/2002), CHF (congestive heart failure) ([place]), Coronary artery disease, non-occlusive, Depression, Diabetes mellitus type 2, insulin dependent ([place]), Hyperlipemia, Hypertensive heart disease, Kidney stones, Morbid obesity ([place]), Osteoarthritis, and Snoring.  PSH:    Past Surgical History:  Procedure Laterality Date  . CARDIAC CATHETERIZATION  09/2000   diffuse LAD 30% LCA  EF 50-60%  . CARDIAC CATHETERIZATION  06/2007   no significant CAD  . CARDIAC CATHETERIZATION N/A 12/25/2015   Procedure: Left Heart Cath and Coronary Angiography;  Surgeon: Minna Merritts, MD;  Location: [facility];  Service: Cardiovascular;  Laterality: N/A;  . CARDIAC CATHETERIZATION N/A 12/25/2015   Procedure: Coronary Stent Intervention;  Surgeon: Yolonda Kida, MD;  Location: [facility];  Service: Cardiovascular;  Laterality: N/A;  . CARDIAC CATHETERIZATION N/A 03/10/2016   Procedure: Left Heart Cath and Coronary Angiography;  Surgeon: Wellington Hampshire, MD;  Location: [facility];  Service: Cardiovascular;  Laterality: N/A;  . CARDIAC CATHETERIZATION N/A 04/06/2016   Procedure: Left Heart Cath and Coronary Angiography;  Surgeon: Belva Crome, MD;  Location: [facility];  Service: Cardiovascular;  Laterality: N/A;  . CIRCUMCISION      Current Outpatient Medications  Medication Sig Dispense Refill  . aspirin 81 MG EC tablet Take 1 tablet (81 mg total) by mouth daily.    . Blood Glucose Monitoring Suppl (ACCU-CHEK GUIDE) w/Device KIT     . carvedilol (COREG) 6.25 MG tablet TAKE 1 TABLET BY MOUTH ONCE A DAY 90 tablet 3  . clopidogrel (PLAVIX) 75 MG tablet Take 1 tablet (75 mg  total) by mouth daily with breakfast. 90 tablet 3  . diclofenac (VOLTAREN) 75 MG EC tablet Take 1 tablet (75 mg total) by mouth 2 (two) times daily. 180 tablet 1  . furosemide (LASIX) 40 MG tablet Take 1 tablet (40 mg total) by mouth 2 (two) times daily. 60 tablet 11  . isosorbide mononitrate (IMDUR) 30 MG 24 hr tablet Take 1 tablet (30 mg total) by mouth 2 (two) times daily. 180 tablet 3  . losartan (COZAAR) 50 MG tablet TAKE 1 TABLET BY MOUTH DAILY 90 tablet 3  . nitroGLYCERIN (NITROSTAT) 0.4 MG SL tablet Place 1 tablet (0.4 mg total) under the tongue every 5 (five) minutes as needed for chest pain. 25 tablet 6  . NOVOLIN 70/30 (70-30) 100 UNIT/ML injection INJECT 80 UNITS SUBCUTANEOUSLY IN THE MORNING AND 70 UNITS IN THE EVENING (Patient taking differently: INJECT 70 UNITS SUBCUTANEOUSLY IN THE MORNING AND 40 UNITS IN THE EVENING) 40 mL 11  . ONE TOUCH ULTRA TEST test strip USE ONE STRIP TO CHECK GLUCOSE TWICE DAILY 100 each 5  . pantoprazole (PROTONIX) 40 MG tablet TAKE 1 TABLET BY MOUTH ONCE DAILY 30 tablet 3  . potassium chloride SA (K-DUR,KLOR-CON) 20 MEQ tablet TAKE 1 TABLET BY MOUTH ONCE  DAILY 90 tablet 1  . pravastatin (PRAVACHOL) 40 MG tablet Take 1 tablet (40 mg total) by mouth every evening. 90 tablet 3  . PRESCRIPTION MEDICATION Eye drops - administer day before and day after eye injections or laser treatments - next office visit mid December 2017    . venlafaxine XR (EFFEXOR XR) 75 MG 24 hr capsule Take 1 capsule (75 mg total) by mouth daily with breakfast. 90 capsule 1   No current facility-administered medications for this visit.      Allergies:   Atorvastatin   Social History:  The patient  reports that  has never smoked. he has never used smokeless tobacco. He reports that he does not drink alcohol or use drugs.   Family History:   family history includes Alzheimer's disease in his mother; Cancer in his brother; Diabetes in his father; Emphysema in his mother; Heart disease  in his father.    Review of Systems: Review of Systems  Constitutional: Positive for malaise/fatigue.  Respiratory: Negative.   Cardiovascular: Positive for chest pain.  Gastrointestinal: Negative.   Musculoskeletal: Negative.   Neurological: Negative.   Psychiatric/Behavioral: Negative.   All other systems reviewed and are negative.    PHYSICAL EXAM: VS:  BP 136/60 (BP Location: Left Arm, Patient Position: Sitting, Cuff Size: Normal)   Pulse 73   Ht 5' 6"  (1.676 m)   Wt 293 lb (132.9 kg)   BMI 47.29 kg/m  , BMI Body mass index is 47.29 kg/m. Constitutional:  oriented to person, place, and time. No distress. Obese  HENT:  Head: Normocephalic and atraumatic.  Eyes:  no discharge. No scleral icterus.  Neck: Normal range of motion. Neck supple. No JVD present.  Cardiovascular: Normal rate, regular rhythm, normal heart sounds and intact distal pulses. Exam reveals no gallop and no friction rub. No edema No murmur heard. Pulmonary/Chest: Effort normal and breath sounds normal. No stridor. No respiratory distress.  no wheezes.  no rales.  no tenderness.  Abdominal: Soft.  no distension.  no tenderness.  Musculoskeletal: Normal range of motion.  no  tenderness or deformity.  Neurological:  normal muscle tone. Coordination normal. No atrophy Skin: Skin is warm and dry. No rash noted. not diaphoretic.  Psychiatric:  normal mood and affect. behavior is normal. Thought content normal.      Recent Labs: 10/19/2017: ALT 17; BUN 14; Creatinine, Ser 1.03; Hemoglobin 14.2; Platelets 234.0; Potassium 4.5; Sodium 140; TSH 2.64    Lipid Panel Lab Results  Component Value Date   CHOL 246 (H) 10/19/2017   HDL 36.40 (L) 10/19/2017   LDLCALC 87 02/21/2017   TRIG 214.0 (H) 10/19/2017      Wt Readings from Last 3 Encounters:  10/20/17 293 lb (132.9 kg)  10/19/17 293 lb 12 oz (133.2 kg)  06/12/17 295 lb (133.8 kg)       ASSESSMENT AND PLAN:  Acute on chronic diastolic CHF  (congestive heart failure) ([place]) - Plan: EKG 12-Lead Appears relatively euvolemic on today's visit No changes to his medications  Coronary artery disease, non-occlusive - Plan: EKG 12-Lead Atypical symptoms, hurts with sitting up, hurts with certain foods, hurts when he touches Sometimes hurts on exertion Long history of atypical pain 2-day pharmacological Myoview has been ordered to rule out ischemia Recently started on H2 blocker which he feels is helping his symptoms He reports he is being scheduled for EGD  Essential hypertension - Plan: EKG 12-Lead Blood pressure is well controlled on today's visit. No changes made  to the medications.  NSTEMI (non-ST elevated myocardial infarction) ([place]) - Plan: EKG 12-Lead Stressed importance of aggressive diabetes and cholesterol control Unclear if he is compliant with his pravastatin LDL running markedly high Zetia sent in Will likely need to change to Crestor 40 and stressed medication compliance   Uncontrolled type 2 diabetes mellitus with other circulatory complication, with long-term current use of insulin ([place]) - Plan: EKG 12-Lead Stressed importance of aggressive diabetes control, low sugars, low carbohydrates Hemoglobin A1c 10.0 Likely exacerbated by underlying depression  High soda intake   Total encounter time more than 45 minutes  Greater than 50% was spent in counseling and coordination of care with the patient  Disposition:   F/U  12 months   No orders of the defined types were placed in this encounter.    Signed, Esmond Plants, M.D., Ph.D. 10/20/2017  [place], [place]

## 2017-10-23 ENCOUNTER — Telehealth: Payer: Self-pay | Admitting: Family Medicine

## 2017-10-23 ENCOUNTER — Telehealth: Payer: Self-pay | Admitting: *Deleted

## 2017-10-23 MED ORDER — VENLAFAXINE HCL ER 150 MG PO CP24: 150.0000 mg | ORAL_CAPSULE | Freq: Every day | ORAL | 5 refills | Status: DC

## 2017-10-23 MED ORDER — PEN NEEDLES 29G X 12MM MISC: 11 refills | Status: DC

## 2017-10-23 MED ORDER — BASAGLAR KWIKPEN 100 UNIT/ML ~~LOC~~ SOPN: 60.0000 [IU] | PEN_INJECTOR | Freq: Every day | SUBCUTANEOUS | 11 refills | Status: DC

## 2017-10-23 MED ORDER — VITAMIN D (ERGOCALCIFEROL) 1.25 MG (50000 UNIT) PO CAPS: 50000.0000 [IU] | ORAL_CAPSULE | ORAL | 0 refills | Status: DC

## 2017-10-23 NOTE — Telephone Encounter (Signed)
Let pt know meds discussed/ med changes sent in.  Increased venlafaxine to 150 mg daily ( stop the 75 mg daily) Start basalgar 60 units daily ( Stop 70/30) this last all day. We may need to increase it.  Take crestor and zetia as reocmmended by cardiology.  take vit D course. Also have him call in 2 weeks with fasting blood sugars and sugar 2 hours after largest meal of day in 2 weeks.

## 2017-10-23 NOTE — Telephone Encounter (Addendum)
Spoke with [name].  He states he changed insurance at the beginning of the year.  He states [facility] he was told that this plan does not cover insulin because you can buy it over the counter.    He states the [employer] pen was going to cost him $145 and the needles $20.  I tried to pull up the formulary on line but it is blocked by [facility].  I called [employer] and after being transferred to 3 different departments, they could not just tell me what insulin is covered.  I had to give a specific names of medication and then they could look it up.  I asked about Lantus Solostar and they said it was covered as a Tier 3 which is still going to be expensive.  Please advise.

## 2017-10-23 NOTE — Telephone Encounter (Signed)
Please have pt call  His insurance to determine what insulins they cover better.

## 2017-10-23 NOTE — Telephone Encounter (Signed)
-----   Message from Antonieta Ibaimothy J Gollan, MD sent at 10/20/2017  5:45 PM EST ----- Cholesterol is markedly high Would recommend he change pravastatin to Crestor 40 mg daily Would also add Zetia 10 mg daily Prescriptions already sent in thx TG

## 2017-10-23 NOTE — Telephone Encounter (Signed)
Patient calling back, unable to afford insulin pen. Would like a call back. Call back [phone]

## 2017-10-23 NOTE — Telephone Encounter (Signed)
Patient aware that prescriptions for Crestor and zetia were sent to his pharmacy.

## 2017-10-23 NOTE — Telephone Encounter (Signed)
[name] notified as instructed by telephone.  He states understanding.  I advised him to call us back if he has any questions.

## 2017-10-24 NOTE — Telephone Encounter (Signed)
Let pt know I am not aware of any other way to get a cheaper insulin than what he is doing. Please cahnge back his med list to what he was previously doing.  Does he want a referral to ENDO?

## 2017-10-25 ENCOUNTER — Telehealth: Payer: Self-pay | Admitting: Cardiovascular Disease

## 2017-10-25 ENCOUNTER — Encounter
Admission: RE | Admit: 2017-10-25 | Discharge: 2017-10-25 | Disposition: A | Discharge status: Home / Self Care | Payer: Medicare Other | Source: Ambulatory Visit | Attending: Cardiovascular Disease | Admitting: Cardiovascular Disease

## 2017-10-25 DIAGNOSIS — R079 Chest pain, unspecified: Secondary | ICD-10-CM

## 2017-10-25 DIAGNOSIS — E1365 Other specified diabetes mellitus with hyperglycemia: Secondary | ICD-10-CM | POA: Diagnosis not present

## 2017-10-25 DIAGNOSIS — E1339 Other specified diabetes mellitus with other diabetic ophthalmic complication: Secondary | ICD-10-CM | POA: Diagnosis not present

## 2017-10-25 DIAGNOSIS — I2510 Atherosclerotic heart disease of native coronary artery without angina pectoris: Secondary | ICD-10-CM | POA: Diagnosis not present

## 2017-10-25 DIAGNOSIS — I214 Non-ST elevation (NSTEMI) myocardial infarction: Secondary | ICD-10-CM

## 2017-10-25 DIAGNOSIS — I10 Essential (primary) hypertension: Secondary | ICD-10-CM | POA: Diagnosis not present

## 2017-10-25 DIAGNOSIS — IMO0002 Reserved for concepts with insufficient information to code with codable children: Secondary | ICD-10-CM

## 2017-10-25 MED ORDER — TECHNETIUM TC 99M TETROFOSMIN IV KIT
13.9700 | PACK | Freq: Once | INTRAVENOUS | Status: AC | PRN
  Administered 2017-10-25: 13.97 via INTRAVENOUS

## 2017-10-25 MED ORDER — TECHNETIUM TC 99M TETROFOSMIN IV KIT
33.4770 | PACK | Freq: Once | INTRAVENOUS | Status: AC | PRN
  Administered 2017-10-25: 33.477 via INTRAVENOUS

## 2017-10-25 MED ORDER — REGADENOSON 0.4 MG/5ML IV SOLN
0.4000 mg | Freq: Once | INTRAVENOUS | Status: AC
  Administered 2017-10-25: 0.4 mg via INTRAVENOUS

## 2017-10-25 NOTE — Telephone Encounter (Signed)
[name] notified as instructed by telephone.Marland Kitchen.  He does not wish to see an endocrinologist at this time.  Will continue doing 70/30 insulin.  He is currently taking 70 units in the morning and 40 units at bedtime.  Medication list updated.

## 2017-10-25 NOTE — Addendum Note (Signed)
Addended by: Damita LackLORING, DONNA S on: 10/25/2017 12:26 PM   Modules accepted: Orders

## 2017-10-25 NOTE — Telephone Encounter (Signed)
Patient states that he has been having chest pain and reviewed that I made Dr. Mariah MillingGollan aware and he will be reviewing his stress test images. EKG during stress test did not show any changes and he states that after rest it resolved. Advised that I will call him with his results either today or tomorrow. He verbalized understanding and had no further questions at this time.

## 2017-10-25 NOTE — Telephone Encounter (Signed)
Pt calling stating he had a myoview this morning He states since he's been home today His chest is hurting, broke out in a little sweat/  Went to lay down and woke up and is still having the pain   Please advise

## 2017-10-26 LAB — NM MYOCAR MULTI W/SPECT W/WALL MOTION / EF
Estimated workload: 1 METS
Exercise duration (min): 0 min
Exercise duration (sec): 0 s
LV dias vol: 157 mL (ref 62–150)
LV sys vol: 96 mL
MPHR: 153 {beats}/min
Peak HR: 90 {beats}/min
Percent HR: 58 %
Rest HR: 69 {beats}/min
SDS: 11
SRS: 7
SSS: 17
TID: 1.14

## 2017-11-02 IMAGING — DX DG CHEST 1V
1 series · 1 of 1 positions shown · non-contrast
Comparison: Prior radiograph from 07/03/2016.

CLINICAL DATA: Initial evaluation for acute chest pain, shortness
of breath.

EXAM:
CHEST 1 VIEW

[chest ap]
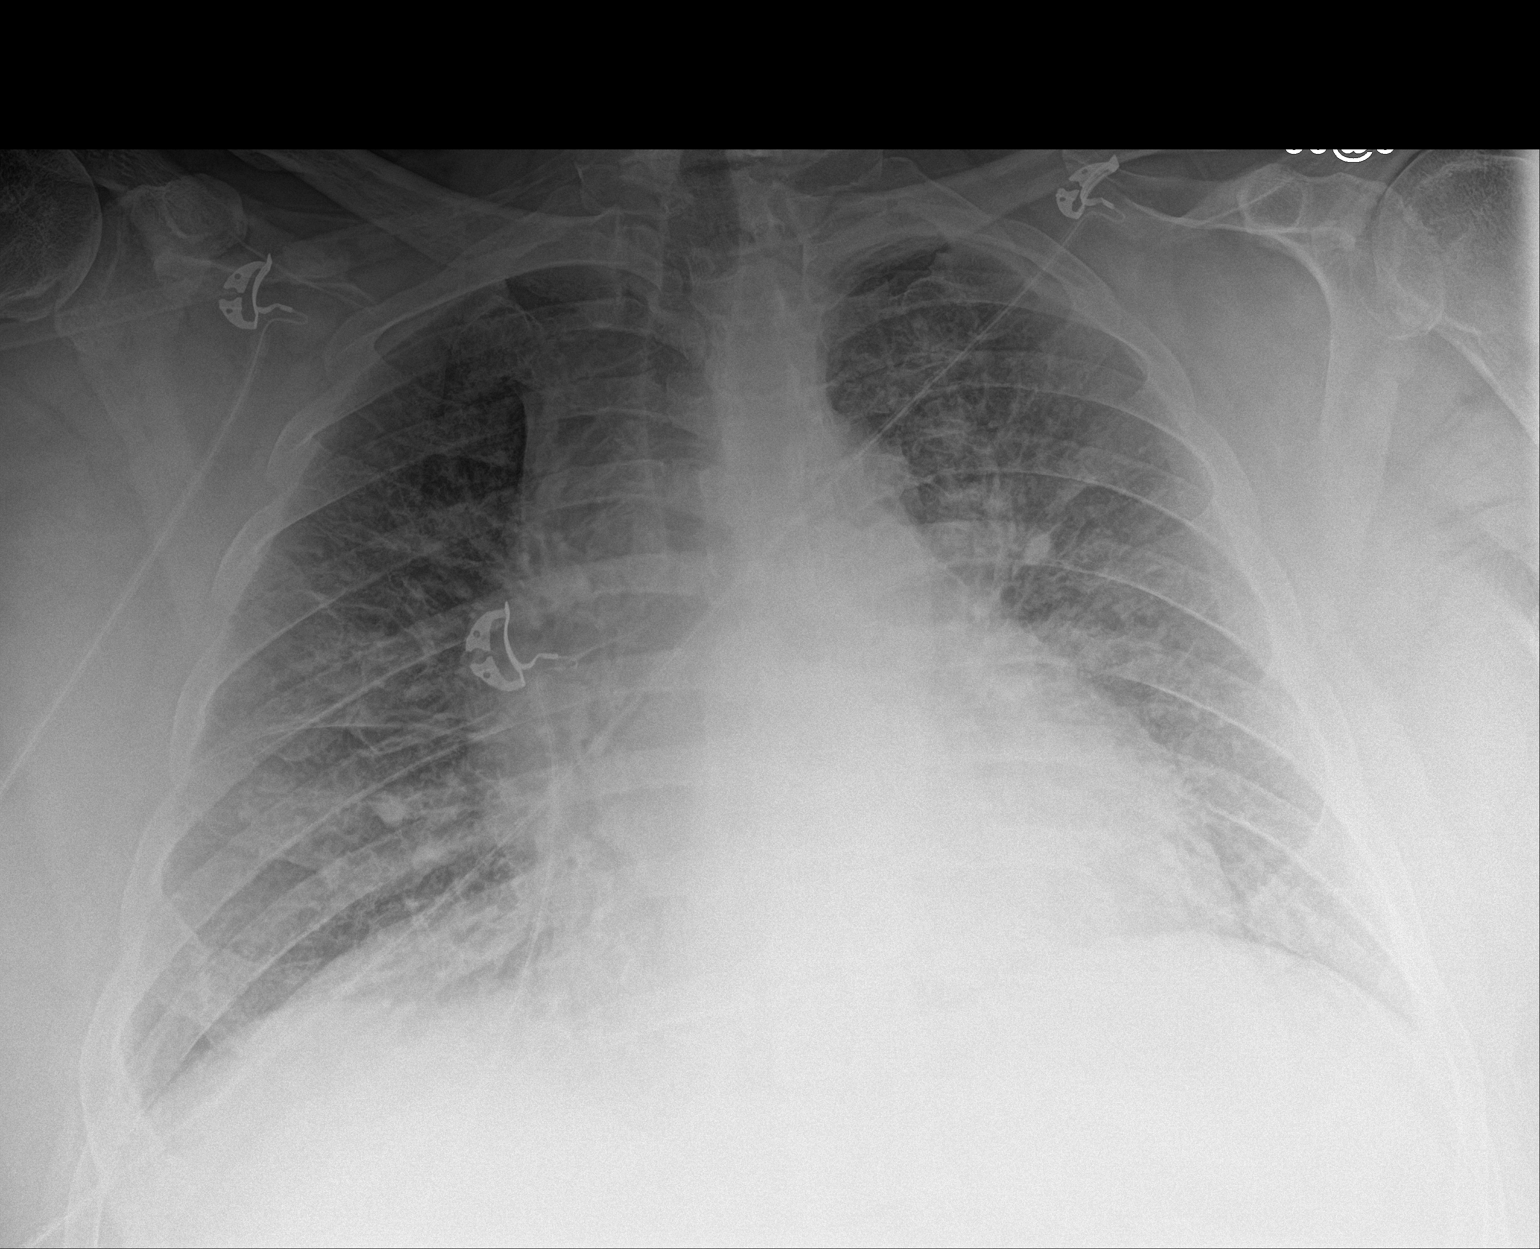

[1 of 1 positions shown; findings below may reference images not displayed]

FINDINGS: Cardiomegaly, similar to previous. Mediastinal silhouette within
normal limits.

Lungs normally inflated. Diffuse pulmonary vascular congestion with
interstitial prominence, compatible with pulmonary interstitial
edema. No focal infiltrates. The no definite pleural effusion. No
pneumothorax.

No acute osseous abnormality.
IMPRESSION: Cardiomegaly with diffuse pulmonary vascular congestion and
interstitial prominence, compatible with pulmonary interstitial
edema.

## 2017-11-03 IMAGING — CR DG CHEST 2V
2 series · 2 of 2 positions shown · non-contrast
Comparison: Chest radiograph dated 09/20/2016

CLINICAL DATA: 65-year-old male with CHF.

EXAM:
CHEST  2 VIEW

[chest pa]
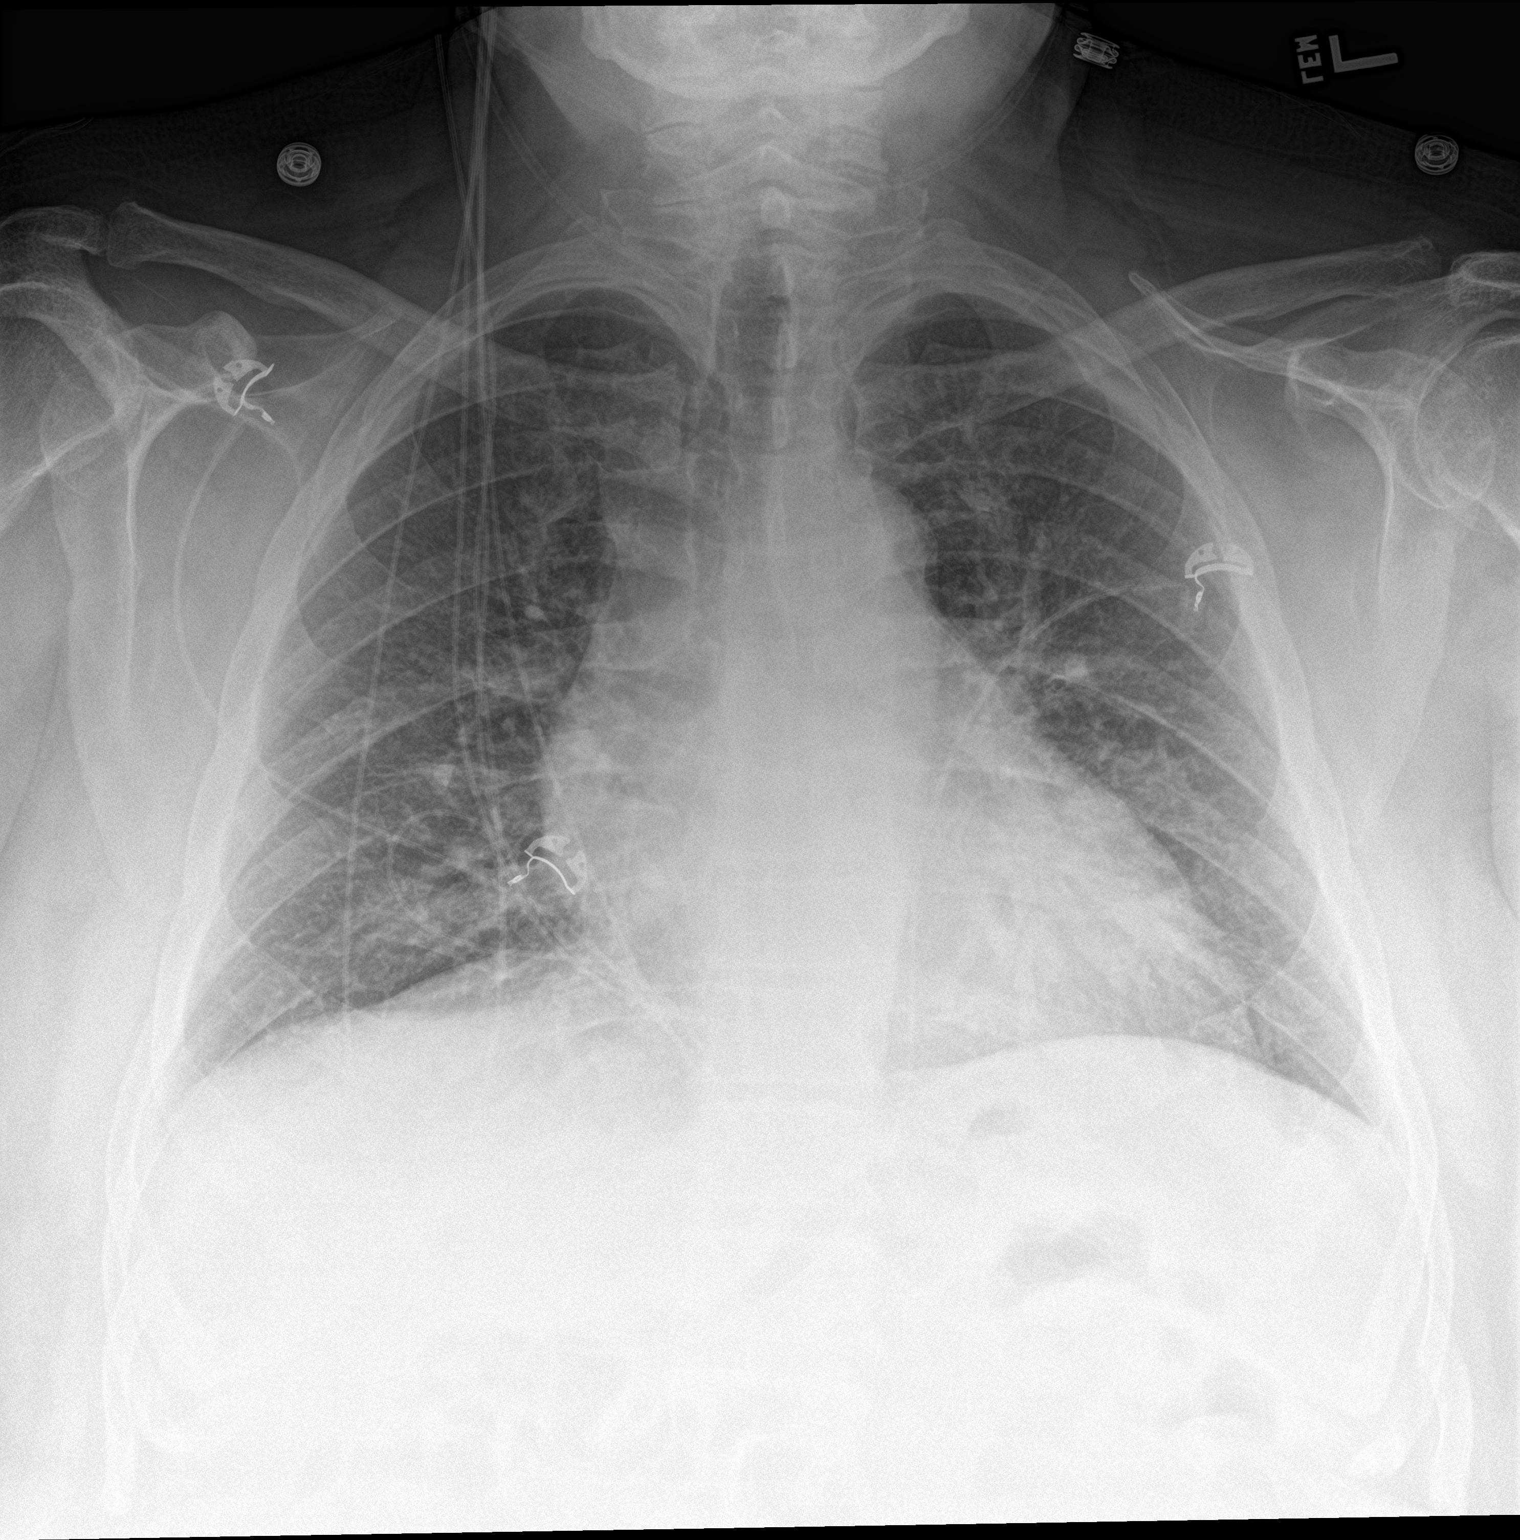

[chest lat]
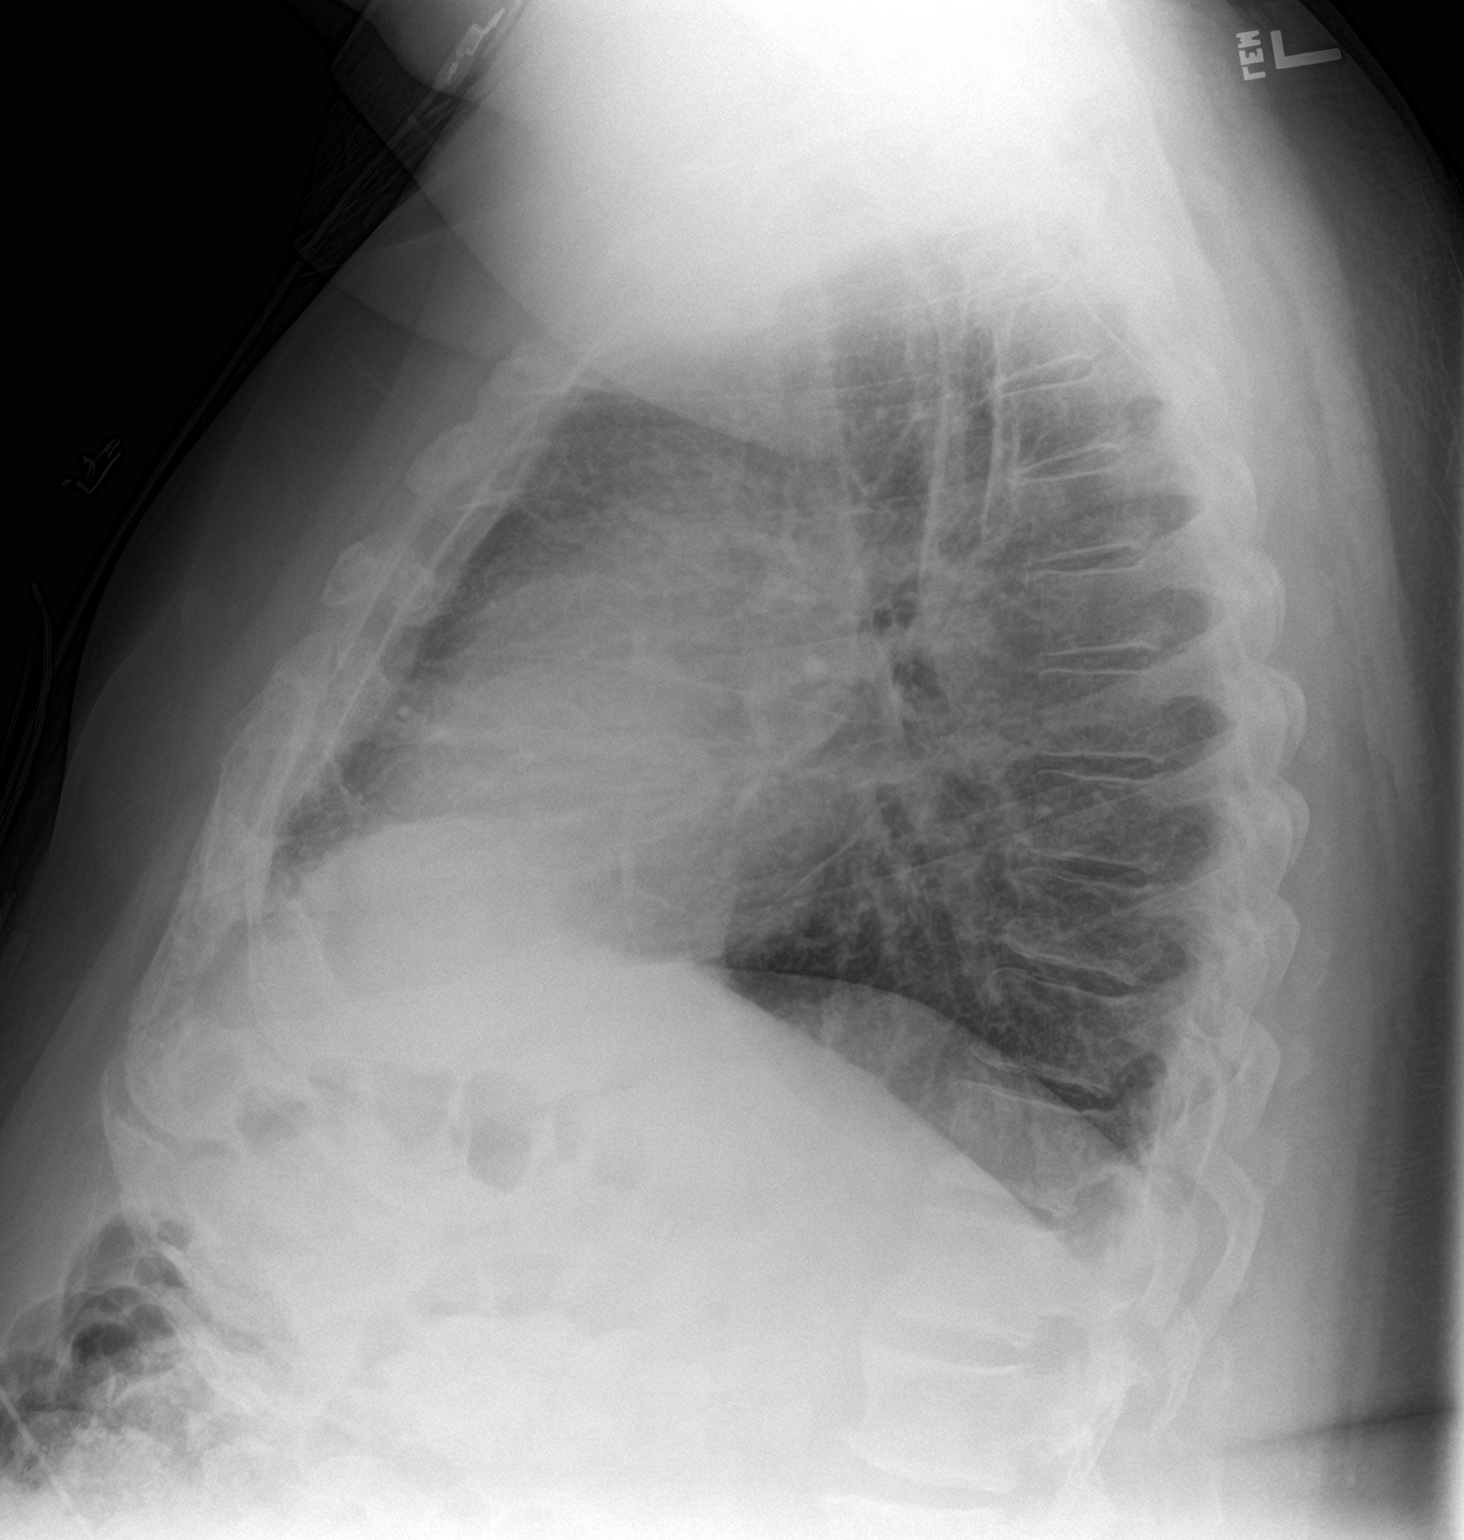

[2 of 2 positions shown; findings below may reference images not displayed]

FINDINGS: Overall there has been slight interval improvement of the previously
seen vascular congestion and pulmonary edema. An area of slight
increased density in the lingula likely vascular crowding.
Developing pneumonia is less likely but not excluded. Clinical
correlation is recommended. No definite focal consolidation. There
is no pleural effusion or pneumothorax. Stable mild cardiomegaly. No
acute osseous pathology.
IMPRESSION: Slight interval improvement of the congestive changes seen on the
radiograph of 09/20/2016.

## 2017-11-14 ENCOUNTER — Telehealth: Payer: Self-pay

## 2017-11-14 NOTE — Telephone Encounter (Signed)
I tried calling Dr Ememila at [phone] to find out what screening was done on 11/13/17 that was abnormal but no answer and mailbox was full. 

## 2017-11-14 NOTE — Telephone Encounter (Signed)
Need more info

## 2017-11-14 NOTE — Telephone Encounter (Signed)
I tried calling Dr Shelby MattocksEmemila at [phone] to find out what screening was done on 11/13/17 that was abnormal but no answer and mailbox was full.

## 2017-11-14 NOTE — Telephone Encounter (Signed)
PLEASE NOTE: All timestamps contained within this report are represented as [place] Standard Time. CONFIDENTIALTY NOTICE: This fax transmission is intended only for the addressee. It contains information that is legally privileged, confidential or otherwise protected from use or disclosure. If you are not the intended recipient, you are strictly prohibited from reviewing, disclosing, copying using or disseminating any of this information or taking any action in reliance on or regarding this information. If you have received this fax in error, please notify us immediately by telephone so that we can arrange for its return to us. Phone: [phone], Toll-Free: [phone], Fax: [phone] Page: 1 of 1 Call Id: [id number] [facility] Client Nonclinical Telephone Record [facility] Client [facility] - [facility] - Night Physician Kerby NoraBedsole, Amy - MD Contact Type Call Who Is Calling Patient / Member / Family / Caregiver Caller Name Dr. Percell BostonEmemila Caller Phone Number [phone] Patient Name William SierrasJohn [name] Patient DOB [birth year] Call Type Message Only Information Provided Reason for Call Request for General Office Information Initial Comment Caller [facility]Ememila [facility] states they saw a patient of Dr. Ermalene SearingBedsole and he had a screening today and the results are abnormal. Additional Comment Call Closed By: Ophelia CharterKayla Young Transaction Date/Time: 11/13/2017 8:32:52 PM (ET)

## 2017-11-15 NOTE — Telephone Encounter (Addendum)
I tried calling Dr Shelby MattocksEmemila at [phone] to find out what screening was done on 11/13/17 that was abnormal but no answer and mailbox was full. I called and spoke with Mr. Vickey Sagestkins.  He states [employer] came out and did a lot of crazy things to him.  He states they told him his blood sugar was high and that his right foot has poor circulation but he states he doesn't understand that because he is not having any issues with his right foot.  He states they did give him paperwork to bring to Dr. Ermalene SearingBedsole at his next office visit.  He is scheduled to see Dr. Ermalene SearingBedsole on 01/16/2018.

## 2017-11-16 NOTE — Telephone Encounter (Signed)
Noted. Will review at 12/2017 OV.

## 2017-11-20 ENCOUNTER — Other Ambulatory Visit: Payer: Self-pay | Admitting: Family Medicine

## 2017-11-21 ENCOUNTER — Other Ambulatory Visit: Payer: Self-pay | Admitting: Cardiovascular Disease

## 2017-11-21 ENCOUNTER — Other Ambulatory Visit: Payer: Self-pay | Admitting: Family Medicine

## 2017-11-22 ENCOUNTER — Encounter: Payer: Self-pay | Admitting: Family Medicine

## 2017-11-24 NOTE — Assessment & Plan Note (Signed)
Poor control likely but start with labs eval. Discussed treatment options.

## 2017-11-24 NOTE — Assessment & Plan Note (Signed)
Given increasing in frequency and exertional.. Concern for progressive angina.. recommend early return to cardiology for re-eval as well as risk factor treatment.

## 2017-11-24 NOTE — Assessment & Plan Note (Signed)
Euvolemic today. 

## 2017-11-24 NOTE — Assessment & Plan Note (Signed)
Continue pantoprazole but add ranitidine ( zantac) 150 mg  twice daily. Please stop at the front desk to set up referral to GI for consideration of GI source of worsening chest pain.

## 2017-11-24 NOTE — Assessment & Plan Note (Signed)
Recommend keeping up with yearly eye exam.

## 2017-11-24 NOTE — Assessment & Plan Note (Addendum)
Re-eval with labs. Pt refuses statin at this time.

## 2017-11-27 ENCOUNTER — Ambulatory Visit: Payer: Medicare Other | Admitting: Gastroenterology

## 2017-11-29 DIAGNOSIS — H348112 Central retinal vein occlusion, right eye, stable: Secondary | ICD-10-CM | POA: Diagnosis not present

## 2017-11-29 DIAGNOSIS — E113391 Type 2 diabetes mellitus with moderate nonproliferative diabetic retinopathy without macular edema, right eye: Secondary | ICD-10-CM | POA: Diagnosis not present

## 2017-11-29 DIAGNOSIS — H34812 Central retinal vein occlusion, left eye, with macular edema: Secondary | ICD-10-CM | POA: Diagnosis not present

## 2017-11-29 DIAGNOSIS — E113312 Type 2 diabetes mellitus with moderate nonproliferative diabetic retinopathy with macular edema, left eye: Secondary | ICD-10-CM | POA: Diagnosis not present

## 2017-12-20 DIAGNOSIS — E113312 Type 2 diabetes mellitus with moderate nonproliferative diabetic retinopathy with macular edema, left eye: Secondary | ICD-10-CM | POA: Diagnosis not present

## 2018-01-16 ENCOUNTER — Other Ambulatory Visit: Payer: Self-pay | Admitting: Cardiovascular Disease

## 2018-01-16 ENCOUNTER — Encounter: Payer: Self-pay | Admitting: Family Medicine

## 2018-01-16 ENCOUNTER — Ambulatory Visit (INDEPENDENT_AMBULATORY_CARE_PROVIDER_SITE_OTHER): Payer: Medicare Other | Admitting: Family Medicine

## 2018-01-16 VITALS — BP 138/64 | HR 85 | Temp 98.2°F | Ht 66.5 in | Wt 288.0 lb

## 2018-01-16 DIAGNOSIS — Z789 Other specified health status: Secondary | ICD-10-CM

## 2018-01-16 DIAGNOSIS — B353 Tinea pedis: Secondary | ICD-10-CM

## 2018-01-16 DIAGNOSIS — E113393 Type 2 diabetes mellitus with moderate nonproliferative diabetic retinopathy without macular edema, bilateral: Secondary | ICD-10-CM | POA: Diagnosis not present

## 2018-01-16 DIAGNOSIS — R079 Chest pain, unspecified: Secondary | ICD-10-CM | POA: Diagnosis not present

## 2018-01-16 DIAGNOSIS — L309 Dermatitis, unspecified: Secondary | ICD-10-CM | POA: Diagnosis not present

## 2018-01-16 DIAGNOSIS — IMO0002 Reserved for concepts with insufficient information to code with codable children: Secondary | ICD-10-CM

## 2018-01-16 DIAGNOSIS — E1365 Other specified diabetes mellitus with hyperglycemia: Secondary | ICD-10-CM

## 2018-01-16 DIAGNOSIS — E1339 Other specified diabetes mellitus with other diabetic ophthalmic complication: Secondary | ICD-10-CM | POA: Diagnosis not present

## 2018-01-16 DIAGNOSIS — G8929 Other chronic pain: Secondary | ICD-10-CM | POA: Diagnosis not present

## 2018-01-16 LAB — HM DIABETES FOOT EXAM

## 2018-01-16 LAB — POCT GLYCOSYLATED HEMOGLOBIN (HGB A1C): Hemoglobin A1C: 9.1 % — AB (ref 4.0–5.6)

## 2018-01-16 MED ORDER — TRIAMCINOLONE ACETONIDE 0.5 % EX CREA: 1.0000 "application " | TOPICAL_CREAM | Freq: Two times a day (BID) | CUTANEOUS | 0 refills | Status: DC

## 2018-01-16 MED ORDER — VITAMIN D (ERGOCALCIFEROL) 1.25 MG (50000 UNIT) PO CAPS: 50000.0000 [IU] | ORAL_CAPSULE | ORAL | 0 refills | Status: DC

## 2018-01-16 MED ORDER — CLOTRIMAZOLE 1 % EX CREA: 1.0000 "application " | TOPICAL_CREAM | Freq: Two times a day (BID) | CUTANEOUS | 2 refills | Status: DC

## 2018-01-16 NOTE — Progress Notes (Signed)
Subjective:    Patient ID: [name], male    DOB: Dec 17, 1950, 67 y.o.   MRN: [id number]  Diabetes  He presents for his follow-up diabetic visit. He has type 2 diabetes mellitus. His disease course has been improving. There are no hypoglycemic associated symptoms. Pertinent negatives for hypoglycemia include no headaches. (Occ lows in AMs) Associated symptoms include chest pain and fatigue. Pertinent negatives for diabetes include no foot ulcerations. There are no hypoglycemic complications. Symptoms are stable. Diabetic complications include heart disease and retinopathy. Current diabetic treatment includes insulin injections. He is compliant with treatment most of the time. His weight is decreasing steadily. He is following a diabetic (working harder on diet) diet. Meal planning includes avoidance of concentrated sweets. He has not had a previous visit with a dietitian. He participates in exercise intermittently. His breakfast blood glucose is taken between 7-8 am. His breakfast blood glucose range is generally 140-180 mg/dl. His lunch blood glucose is taken between 11-12 pm. His lunch blood glucose range is generally >200 mg/dl. His bedtime blood glucose is taken between 9-10 pm. His bedtime blood glucose range is generally >200 mg/dl. An ACE inhibitor/angiotensin II receptor blocker is being taken. He does not see a podiatrist.Eye exam is current.   Wt Readings from Last 3 Encounters:  01/16/18 288 lb (130.6 kg)  10/20/17 293 lb (132.9 kg)  10/19/17 293 lb 12 oz (133.38 kg)     67 year old morbidly obese male presents for DM follow up.   At last OV in 10/19/2017 he was having chest pain  Increase.  Returned to see cardiology on  10/20/2017 Dr. Mariah Milling. NSTEMI in 11/2015 (PCI to LAD; was discharged on Plavix) and again 02/2016 (no PCI; was discharged on Brilinta), Again presented to [facility] ER on 04/05/16 for NSTEMI (no PCI),  catheterization 04/06/2016, stable LAD stent, occluded RCA with  collaterals Chronic total occlusion of RCA EF 55 to 60%  Felt CP due to Chronic chest pain syndrome,GE RD  Today:   he feels mood is improved in last few weeks.. vit D seem to be helping.  Diabetes:   Improving A1C , 5 lb weight loss in last 3 months  Lab Results  Component Value Date   HGBA1C 9.1 (A) 01/16/2018  See above summary as well.  Stopped caffeine, still some soda, working Now more active.. 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  Now getting out of bed. Mood improving overall. Vit D has helped with energy. Will refill.   In 09/2017 zetia, stopped statin given SE.... Over due for re-eval. Will do at next labs in 02/2018, no SE to zetia.   He has had rash on arms and right upper calf for years. Picks at skin on arms although not itchy  New rash on feet, dry flaky, occ ithcy,  More red, spreading in last month.  Blood pressure 138/64, pulse 85, temperature 98.2 F (36.8 C), temperature source Oral, height 5' 6.5" (1.689 m), weight 288 lb (130.6 kg).  Review of Systems  Constitutional: Positive for fatigue. Negative for fever.  HENT: Negative for ear pain.   Eyes: Negative for pain.  Respiratory: Negative for cough and shortness of breath.   Cardiovascular: Positive for chest pain. Negative for palpitations and leg swelling.       No change and nitroglycerin  Or reflux med helps  Gastrointestinal: Negative for abdominal pain.  Genitourinary: Negative for dysuria.  Musculoskeletal: Negative for arthralgias.  Neurological: Negative for syncope, light-headedness  and headaches.  Psychiatric/Behavioral: Negative for dysphoric mood.       Objective:   Physical Exam  Constitutional: He is oriented to person, place, and time. Vital signs are normal. He appears well-developed and well-nourished.  Morbid obesity   HENT:  Head: Normocephalic.  Right Ear: Hearing normal.  Left Ear: Hearing normal.  Nose: Nose normal.  Mouth/Throat: Oropharynx is clear and moist and mucous  membranes are normal.  Neck: Trachea normal. Carotid bruit is not present. No thyroid mass and no thyromegaly present.  Cardiovascular: Normal rate, regular rhythm and normal pulses. Exam reveals no gallop, no distant heart sounds and no friction rub.  No murmur heard. No peripheral edema  Pulmonary/Chest: Effort normal and breath sounds normal. No respiratory distress.  Neurological: He is alert and oriented to person, place, and time.  Skin: Skin is warm, dry and intact. No rash noted.  thickened dry skin on hands dorsally and dorsal arms.. Excoriations and scar where chronic picking.  Scabs on scalp, no erthema  sun damage to skin on arms Leading edge to rash on right leg and feet, dry flaky skin  Psychiatric: His speech is normal. Judgment and thought content normal. His affect is blunt. He is withdrawn. Cognition and memory are normal. He exhibits a depressed mood.       Diabetic foot exam: Normal inspection No skin breakdown No calluses  Normal DP pulses Normal sensation to light touch and monofilament Nails normal  Assessment & Plan:

## 2018-01-16 NOTE — Assessment & Plan Note (Signed)
Treat with antifungal. 

## 2018-01-16 NOTE — Assessment & Plan Note (Signed)
?   Due to chronic picking, ? Photosensitivity? Treat with topical steroid.

## 2018-01-16 NOTE — Patient Instructions (Signed)
Keep up great work on being more active. Continue healthy low carb diet, decrease sweets further.  Continue current dose of insulin.  Continue zetia for cholesterol.  Start cream for arms, stop picking at them and limit sun exposure or wear sunscreen on arms. Start fungal cream for leg and feet. Spray shoes weekly for fungal infection.

## 2018-01-16 NOTE — Assessment & Plan Note (Signed)
Doing much better on zetia. Re-eval chol at [facility] labs.

## 2018-01-16 NOTE — Assessment & Plan Note (Signed)
Improving A1C with pt mood better he has been getting out of bed.. Going to a part-time job.  encouraged pt to continue improvement. He cannot afford other DM meds in past.. Not willing to increase now given lows occ in AM

## 2018-01-16 NOTE — Assessment & Plan Note (Signed)
Followed by eye MD. 

## 2018-01-17 ENCOUNTER — Other Ambulatory Visit: Payer: Self-pay | Admitting: Family Medicine

## 2018-01-25 ENCOUNTER — Other Ambulatory Visit: Payer: Self-pay

## 2018-01-25 MED ORDER — LOSARTAN POTASSIUM 50 MG PO TABS: 50.0000 mg | ORAL_TABLET | Freq: Every day | ORAL | 3 refills | Status: DC

## 2018-01-25 MED ORDER — ISOSORBIDE MONONITRATE ER 30 MG PO TB24: 30.0000 mg | ORAL_TABLET | Freq: Two times a day (BID) | ORAL | 3 refills | Status: DC

## 2018-01-25 MED ORDER — EZETIMIBE 10 MG PO TABS: 10.0000 mg | ORAL_TABLET | Freq: Every day | ORAL | 3 refills | Status: DC

## 2018-01-26 ENCOUNTER — Other Ambulatory Visit: Payer: Self-pay

## 2018-01-26 MED ORDER — FUROSEMIDE 40 MG PO TABS: 40.0000 mg | ORAL_TABLET | Freq: Two times a day (BID) | ORAL | 3 refills | Status: DC

## 2018-01-26 NOTE — Telephone Encounter (Signed)
Refill for Furosemide to [facility]

## 2018-01-30 ENCOUNTER — Other Ambulatory Visit: Payer: Self-pay | Admitting: *Deleted

## 2018-01-30 MED ORDER — CLOPIDOGREL BISULFATE 75 MG PO TABS: ORAL_TABLET | ORAL | 3 refills | Status: DC

## 2018-01-30 MED ORDER — CARVEDILOL 6.25 MG PO TABS: 6.2500 mg | ORAL_TABLET | Freq: Every day | ORAL | 3 refills | Status: DC

## 2018-01-30 MED ORDER — ROSUVASTATIN CALCIUM 40 MG PO TABS
40.0000 mg | ORAL_TABLET | Freq: Every day | ORAL | 3 refills | Status: DC
Start: 2018-01-30 — End: 2018-12-27

## 2018-01-30 MED ORDER — VENLAFAXINE HCL ER 150 MG PO CP24: 150.0000 mg | ORAL_CAPSULE | Freq: Every day | ORAL | 1 refills | Status: DC

## 2018-01-30 MED ORDER — RANITIDINE HCL 150 MG PO TABS: 150.0000 mg | ORAL_TABLET | Freq: Two times a day (BID) | ORAL | 3 refills | Status: DC

## 2018-01-30 MED ORDER — TRIAMCINOLONE ACETONIDE 0.5 % EX CREA: 1.0000 "application " | TOPICAL_CREAM | Freq: Two times a day (BID) | CUTANEOUS | 0 refills | Status: DC

## 2018-01-30 MED ORDER — VITAMIN D3 1.25 MG (50000 UT) PO CAPS: 1.0000 | ORAL_CAPSULE | ORAL | 0 refills | Status: DC

## 2018-01-30 NOTE — Telephone Encounter (Signed)
Last office visit 01/16/2018.  Last refilled Triamcinolone cream:  01/16/2018 for 30 g with no refills.  Vit D3 50,000 units 01/16/2018 for #12 with no refills.  [facility]  Ok to refill?

## 2018-01-31 MED ORDER — PANTOPRAZOLE SODIUM 40 MG PO TBEC: 40.0000 mg | DELAYED_RELEASE_TABLET | Freq: Every day | ORAL | 1 refills | Status: DC

## 2018-01-31 NOTE — Addendum Note (Signed)
Addended by: Damita LackLORING, Shyler Hamill S on: 01/31/2018 12:04 PM   Modules accepted: Orders

## 2018-02-13 DIAGNOSIS — E113312 Type 2 diabetes mellitus with moderate nonproliferative diabetic retinopathy with macular edema, left eye: Secondary | ICD-10-CM | POA: Diagnosis not present

## 2018-02-13 DIAGNOSIS — H348112 Central retinal vein occlusion, right eye, stable: Secondary | ICD-10-CM | POA: Diagnosis not present

## 2018-02-13 DIAGNOSIS — H34812 Central retinal vein occlusion, left eye, with macular edema: Secondary | ICD-10-CM | POA: Diagnosis not present

## 2018-02-13 DIAGNOSIS — E113391 Type 2 diabetes mellitus with moderate nonproliferative diabetic retinopathy without macular edema, right eye: Secondary | ICD-10-CM | POA: Diagnosis not present

## 2018-02-27 ENCOUNTER — Ambulatory Visit: Payer: PPO

## 2018-02-27 ENCOUNTER — Ambulatory Visit (INDEPENDENT_AMBULATORY_CARE_PROVIDER_SITE_OTHER): Payer: Medicare Other

## 2018-02-27 VITALS — BP 130/68 | HR 70 | Temp 98.7°F | Ht 66.5 in | Wt 284.5 lb

## 2018-02-27 DIAGNOSIS — E785 Hyperlipidemia, unspecified: Secondary | ICD-10-CM

## 2018-02-27 DIAGNOSIS — I10 Essential (primary) hypertension: Secondary | ICD-10-CM

## 2018-02-27 DIAGNOSIS — IMO0002 Reserved for concepts with insufficient information to code with codable children: Secondary | ICD-10-CM

## 2018-02-27 DIAGNOSIS — Z23 Encounter for immunization: Secondary | ICD-10-CM | POA: Diagnosis not present

## 2018-02-27 DIAGNOSIS — E1365 Other specified diabetes mellitus with hyperglycemia: Secondary | ICD-10-CM | POA: Diagnosis not present

## 2018-02-27 DIAGNOSIS — Z5181 Encounter for therapeutic drug level monitoring: Secondary | ICD-10-CM

## 2018-02-27 DIAGNOSIS — R6882 Decreased libido: Secondary | ICD-10-CM

## 2018-02-27 DIAGNOSIS — Z0000 Encounter for general adult medical examination without abnormal findings: Secondary | ICD-10-CM

## 2018-02-27 DIAGNOSIS — E559 Vitamin D deficiency, unspecified: Secondary | ICD-10-CM | POA: Diagnosis not present

## 2018-02-27 DIAGNOSIS — E1339 Other specified diabetes mellitus with other diabetic ophthalmic complication: Secondary | ICD-10-CM | POA: Diagnosis not present

## 2018-02-27 LAB — VITAMIN D 25 HYDROXY (VIT D DEFICIENCY, FRACTURES): VITD: 47.26 ng/mL (ref 30.00–100.00)

## 2018-02-27 LAB — LIPID PANEL
Cholesterol: 99 mg/dL (ref 0–200)
HDL: 32.4 mg/dL — ABNORMAL LOW (ref >39.00)
LDL Cholesterol: 50 mg/dL (ref 0–99)
NonHDL: 66.61
Total CHOL/HDL Ratio: 3
Triglycerides: 85 mg/dL (ref 0.0–149.0)
VLDL: 17 mg/dL (ref 0.0–40.0)

## 2018-02-27 LAB — COMPREHENSIVE METABOLIC PANEL
ALT: 18 U/L (ref 0–53)
AST: 15 U/L (ref 0–37)
Albumin: 3.9 g/dL (ref 3.5–5.2)
Alkaline Phosphatase: 84 U/L (ref 39–117)
BUN: 12 mg/dL (ref 6–23)
CO2: 33 mEq/L — ABNORMAL HIGH (ref 19–32)
Calcium: 9.2 mg/dL (ref 8.4–10.5)
Chloride: 102 mEq/L (ref 96–112)
Creatinine, Ser: 0.97 mg/dL (ref 0.40–1.50)
GFR: 81.73 mL/min (ref >60.00)
Glucose, Bld: 212 mg/dL — ABNORMAL HIGH (ref 70–99)
Potassium: 4.5 mEq/L (ref 3.5–5.1)
Sodium: 142 mEq/L (ref 135–145)
Total Bilirubin: 0.5 mg/dL (ref 0.2–1.2)
Total Protein: 7.3 g/dL (ref 6.0–8.3)

## 2018-02-27 LAB — HEMOGLOBIN A1C: Hgb A1c MFr Bld: 9.9 % — ABNORMAL HIGH (ref 4.6–6.5)

## 2018-02-27 NOTE — Progress Notes (Signed)
Subjective:   [name] is a 67 y.o. male who presents for Medicare Annual/Subsequent preventive examination.  Review of Systems:  N/A Cardiac Risk Factors include: advanced age (>74mn, >>66women);obesity (BMI >30kg/m2);diabetes mellitus;dyslipidemia;hypertension;male gender     Objective:    Vitals: BP 130/68 (BP Location: Right Arm, Patient Position: Sitting, Cuff Size: Normal)   Pulse 70   Temp 98.7 F (37.1 C) (Oral)   Ht 5' 6.5" (1.689 m) Comment: shoes  Wt 284 lb 8 oz (129 kg)   SpO2 98%   BMI 45.23 kg/m   Body mass index is 45.23 kg/m.  Advanced Directives 02/27/2018 02/21/2017 09/20/2016 07/03/2016 05/01/2016 04/05/2016 04/05/2016  Does Patient Have a Medical Advance Directive? _0  No No  Would patient like information on creating a medical advance directive? No - Patient declined - No - Patient declined No - patient declined information - No - patient declined information -    Tobacco Social History   Tobacco Use  Smoking Status Never Smoker  Smokeless Tobacco Never Used     Counseling given: No   Clinical Intake:  Pre-visit preparation completed: Yes  Pain : No/denies pain Pain Score: 0-No pain     Nutritional Status: BMI > 30  Obese Nutritional Risks: None Diabetes: Yes CBG done?: No Did pt. bring in CBG monitor from home?: No  How often do you need to have someone help you when you read instructions, pamphlets, or other written materials from your doctor or pharmacy?: 1 - Never What is the last grade level you completed in school?: 6th grade  Interpreter Needed?: No  Comments: pt lives alone Information entered by :: LPinson, LPN  Past Medical History:  Diagnosis Date  . Back injury 02/2002   worker's comp  . CHF (congestive heart failure) ([place]   . Coronary artery disease, non-occlusive    a. cath 2002 with no sig CAD;  b. cath 2008 normal LM, LAD, LCx, p&dRCA 20-30%, PDA 30%; c.11/2015 NSTEMI/PCI: LM nl, LAD 95p (2.5x15 Xience  DES), LCX nl, RCA 100p/m w/ L->R collats, EF 55-65% c. NSTEMI (02/2016) with no culprit leision, switched to Brilinta.  d. NSTEMI 03/2016: again, no culprit lesion and switched back to plavix 2/2 SOB with Brilnta.    . Depression   . Diabetes mellitus type 2, insulin dependent ([place]   . Hyperlipemia   . Hypertensive heart disease   . Kidney stones   . Morbid obesity ([facility]   . Osteoarthritis   . Snoring    Past Surgical History:  Procedure Laterality Date  . CARDIAC CATHETERIZATION  09/2000   diffuse LAD 30% LCA  EF 50-60%  . CARDIAC CATHETERIZATION  06/2007   no significant CAD  . CARDIAC CATHETERIZATION N/A 12/25/2015   Procedure: Left Heart Cath and Coronary Angiography;  Surgeon: TMinna Merritts MD;  [facility];  Service: Cardiovascular;  Laterality: N/A;  . CARDIAC CATHETERIZATION N/A 12/25/2015   Procedure: Coronary Stent Intervention;  Surgeon: DYolonda Kida MD;  [facility];  Service: Cardiovascular;  Laterality: N/A;  . CARDIAC CATHETERIZATION N/A 03/10/2016   Procedure: Left Heart Cath and Coronary Angiography;  Surgeon: MWellington Hampshire MD;  [facility];  Service: Cardiovascular;  Laterality: N/A;  . CARDIAC CATHETERIZATION N/A 04/06/2016   Procedure: Left Heart Cath and Coronary Angiography;  Surgeon: HBelva Crome MD;  [facility];  Service: Cardiovascular;  Laterality: N/A;  . CIRCUMCISION  Family History  Problem Relation Age of Onset  . Alzheimer's disease Mother   . Emphysema Mother   . Diabetes Father   . Heart disease Father        MI  . Cancer Brother        ? Neck cancer   Social History   Socioeconomic History  . Marital status: Widowed    Spouse name: Not on file  . Number of children: Not on file  . Years of education: Not on file  . Highest education level: Not on file  Occupational History  . Occupation: disabled    Fish farm manager: UNEMPLOYED    Comment: back injury    Social Needs  . Financial resource strain: Not on file  . Food insecurity:    Worry: Not on file    Inability: Not on file  . Transportation needs:    Medical: Not on file    Non-medical: Not on file  Tobacco Use  . Smoking status: Never Smoker  . Smokeless tobacco: Never Used  Substance and Sexual Activity  . Alcohol use: No    Alcohol/week: 0.0 oz  . Drug use: No  . Sexual activity: Not Currently  Lifestyle  . Physical activity:    Days per week: Not on file    Minutes per session: Not on file  . Stress: Not on file  Relationships  . Social connections:    Talks on phone: Not on file    Gets together: Not on file    Attends religious service: Not on file    Active member of club or organization: Not on file    Attends meetings of clubs or organizations: Not on file    Relationship status: Not on file  Other Topics Concern  . Not on file  Social History Narrative   Financial concerns, wife with depression.   Minimal exercise.   Diet: poor.    Outpatient Encounter Medications as of 02/27/2018  Medication Sig  . aspirin 81 MG EC tablet Take 1 tablet (81 mg total) by mouth daily.  . Blood Glucose Monitoring Suppl (ACCU-CHEK GUIDE) w/Device KIT   . carvedilol (COREG) 6.25 MG tablet Take 1 tablet (6.25 mg total) by mouth daily.  . Cholecalciferol (VITAMIN D3) 50000 units CAPS Take 1 capsule by mouth every 7 (seven) days.  . clopidogrel (PLAVIX) 75 MG tablet TAKE 1 TABLET BY MOUTH ONCE DAILY WITH BREAKFAST  . clotrimazole (LOTRIMIN) 1 % cream Apply 1 application topically 2 (two) times daily. Apply to feet and leg bilateral, continue 2 days after rash gone.  . ezetimibe (ZETIA) 10 MG tablet Take 1 tablet (10 mg total) by mouth daily.  . furosemide (LASIX) 40 MG tablet Take 1 tablet (40 mg total) by mouth 2 (two) times daily.  . insulin NPH-regular Human (NOVOLIN 70/30) (70-30) 100 UNIT/ML injection Inject 70 units in the morning and 40 units at bedtime.  . isosorbide  mononitrate (IMDUR) 30 MG 24 hr tablet Take 1 tablet (30 mg total) by mouth 2 (two) times daily.  Marland Kitchen losartan (COZAAR) 50 MG tablet Take 1 tablet (50 mg total) by mouth daily.  . nitroGLYCERIN (NITROSTAT) 0.4 MG SL tablet DISSOLVE 1 TABLET UNDER TONGUE AS NEEDEDFOR CHEST PAIN. MAY REPEAT 5 MINUTES APART 3 TIMES IF NEEDED  . ONE TOUCH ULTRA TEST test strip USE ONE STRIP TO CHECK GLUCOSE TWICE DAILY  . pantoprazole (PROTONIX) 40 MG tablet Take 1 tablet (40 mg total) by mouth daily.  . potassium chloride  SA (K-DUR,KLOR-CON) 20 MEQ tablet TAKE 1 TABLET BY MOUTH ONCE DAILY  . PRESCRIPTION MEDICATION Eye drops - administer day before and day after eye injections or laser treatments - next office visit mid December 2017  . ranitidine (ZANTAC) 150 MG tablet Take 1 tablet (150 mg total) by mouth 2 (two) times daily.  . rosuvastatin (CRESTOR) 40 MG tablet Take 1 tablet (40 mg total) by mouth daily.  Marland Kitchen triamcinolone cream (KENALOG) 0.5 % Apply 1 application topically 2 (two) times daily. To arms  . venlafaxine XR (EFFEXOR-XR) 150 MG 24 hr capsule Take 1 capsule (150 mg total) by mouth daily with breakfast.   No facility-administered encounter medications on file as of 02/27/2018.     Activities of Daily Living In your present state of health, do you have any difficulty performing the following activities: 02/27/2018  Hearing? N  Vision? Y  Difficulty concentrating or making decisions? N  Walking or climbing stairs? N  Dressing or bathing? N  Doing errands, shopping? N  Preparing Food and eating ? N  Using the Toilet? N  In the past six months, have you accidently leaked urine? N  Do you have problems with loss of bowel control? N  Managing your Medications? N  Managing your Finances? N  Housekeeping or managing your Housekeeping? N  Some recent data might be hidden    Patient Care Team: Jinny Sanders, MD as PCP - General Rockey Situ Kathlene November, MD as Consulting Physician (Cardiology)   Assessment:    This is a routine wellness examination for Demontrae.   Hearing Screening   _0  _1  _2  _3  _4  _5  _6  _7  _8   Right ear:   40 40 40  0    Left ear:   40 40 40  0    Vision Screening Comments: Vision exam in June 2019    Exercise Activities and Dietary recommendations Current Exercise Habits: The patient does not participate in regular exercise at present, Exercise limited by: None identified  Goals    . Patient Stated     Starting 02/27/2018, I will continue to take medications as prescribed.        Fall Risk Fall Risk  02/27/2018 02/21/2017 11/20/2015 11/20/2015 12/21/2012  Falls in the past year? Yes No No No No  Comment fell while walking dog; fell at church after missing a step - - - -  Number falls in past yr: 2 or more - - - -  Injury with Fall? Yes - - - -  Risk Factor Category  High Fall Risk - - - -   Depression Screen PHQ 2/9 Scores 02/27/2018 02/21/2017 10/10/2016 11/20/2015  PHQ - 2 Score _9 0  PHQ- 9 Score _10 -    Cognitive Function MMSE - Mini Mental State Exam 02/27/2018 02/21/2017  Orientation to time 5 5  Orientation to Place 5 5  Registration 3 3  Attention/ Calculation 0 0  Recall 3 3  Language- name 2 objects 0 0  Language- repeat 1 1  Language- follow 3 step command 3 3  Language- read & follow direction 0 0  Write a sentence 0 0  Copy design 0 0  Total score 20 20       PLEASE NOTE: A Mini-Cog screen was completed. Maximum score is 20. A value of 0 denotes this part of Folstein MMSE was not completed or the patient failed this part of the Mini-Cog screening.   Mini-Cog Screening Orientation to  Time - Max 5 pts Orientation to Place - Max 5 pts Registration - Max 3 pts Recall - Max 3 pts Language Repeat - Max 1 pts Language Follow 3 Step Command - Max 3 pts   Immunization History  Administered Date(s) Administered  . Influenza Split 05/12/2011, 07/09/2012  . Influenza Whole 06/30/2007, 05/29/2008, 07/03/2009  .  Influenza,inj,Quad PF,6+ Mos 05/31/2013, 07/09/2014, 06/25/2015, 07/05/2016, 06/22/2017  . Pneumococcal Conjugate-13 02/14/2015  . Pneumococcal Polysaccharide-23 06/30/2007, 12/21/2012, 02/27/2018  . Td 06/30/2007   Screening Tests Health Maintenance  Topic Date Due  . TETANUS/TDAP  02/28/2019 (Originally 06/29/2017)  . INFLUENZA VACCINE  03/29/2018  . HEMOGLOBIN A1C  07/19/2018  . FOOT EXAM  01/17/2019  . OPHTHALMOLOGY EXAM  02/24/2019  . COLONOSCOPY  09/04/2019  . Hepatitis C Screening  Completed  . PNA vac Low Risk Adult  Completed      Plan:     I have personally reviewed, addressed, and noted the following in the patient's chart:  A. Medical and social history B. Use of alcohol, tobacco or illicit drugs  C. Current medications and supplements D. Functional ability and status E.  Nutritional status F.  Physical activity G. Advance directives H. List of other physicians I.  Hospitalizations, surgeries, and ER visits in previous 12 months J.  [place] to include hearing, vision, cognitive, depression L. Referrals and appointments - none  In addition, I have reviewed and discussed with patient certain preventive protocols, quality metrics, and best practice recommendations. A written personalized care plan for preventive services as well as general preventive health recommendations were provided to patient.  See attached scanned questionnaire for additional information.   Signed,   Lindell Noe, MHA, BS, LPN Health Coach

## 2018-02-27 NOTE — Progress Notes (Signed)
PCP notes:   Health maintenance:  Tetanus vaccine - postponed/insurance PPSV23 - administered  Abnormal screenings:   Hearing: failed  Hearing Screening   125Hz  250Hz  500Hz  1000Hz  2000Hz  3000Hz  4000Hz  6000Hz  8000Hz   Right ear:   40 40 40  0    Left ear:   40 40 40  0      Falls: hx of multiple falls Fall Risk  02/27/2018 02/21/2017 11/20/2015 11/20/2015 12/21/2012  Falls in the past year? Yes No No No No  Comment fell while walking dog; fell at church after missing a step - - - -  Number falls in past yr: 2 or more - - - -  Injury with Fall? Yes - - - -  Risk Factor Category  High Fall Risk - - - -    Depression score: 10 Depression screen [facility] 2/9 02/27/2018 02/21/2017 10/10/2016 11/20/2015 11/20/2015  Decreased Interest 1 1 3  0 0  Down, Depressed, Hopeless 3 2 3  0 0  PHQ - 2 Score 4 3 6  0 0  Altered sleeping 2 3 3  - -  Tired, decreased energy 3 3 3  - -  Change in appetite 0 0 0 - -  Feeling bad or failure about yourself  1 2 3  - -  Trouble concentrating 0 0 2 - -  Moving slowly or fidgety/restless 0 0 0 - -  Suicidal thoughts 0 0 0 - -  PHQ-9 Score 10 11 17  - -  Difficult doing work/chores Not difficult at all Not difficult at all - - -    Patient concerns:   Patient voiced several concerns regarding erectile dysfunction. States prescribed ED medication was ineffective.  Patient states GERD medication is ineffective.  Patient reports concerns with chronic fatigue.   Nurse concerns:  None  Next PCP appt:   03/06/18 @ 1000

## 2018-02-27 NOTE — Patient Instructions (Signed)
[name] , Thank you for taking time to come for your Medicare Wellness Visit. I appreciate your ongoing commitment to your health goals. Please review the following plan we discussed and let me know if I can assist you in the future.   These are the goals we discussed: Goals    . Patient Stated     Starting 02/27/2018, I will continue to take medications as prescribed.        This is a list of the screening recommended for you and due dates:  Health Maintenance  Topic Date Due  . Tetanus Vaccine  02/28/2019*  . Flu Shot  03/29/2018  . Hemoglobin A1C  07/19/2018  . Complete foot exam   01/17/2019  . Eye exam for diabetics  02/24/2019  . Colon Cancer Screening  09/04/2019  .  Hepatitis C: One time screening is recommended by Center for Disease Control  (CDC) for  adults born from [birth year] through 1966.   Completed  . Pneumonia vaccines  Completed  *Topic was postponed. The date shown is not the original due date.   Preventive Care for Adults  A healthy lifestyle and preventive care can promote health and wellness. Preventive health guidelines for adults include the following key practices.  . A routine yearly physical is a good way to check with your health care provider about your health and preventive screening. It is a chance to share any concerns and updates on your health and to receive a thorough exam.  . Visit your dentist for a routine exam and preventive care every 6 months. Brush your teeth twice a day and floss once a day. Good oral hygiene prevents tooth decay and gum disease.  . The frequency of eye exams is based on your age, health, family medical history, use  of contact lenses, and other factors. Follow your health care provider's recommendations for frequency of eye exams.  . Eat a healthy diet. Foods like vegetables, fruits, whole grains, low-fat dairy products, and lean protein foods contain the nutrients you need without too many calories. Decrease your intake of  foods high in solid fats, added sugars, and salt. Eat the right amount of calories for you. Get information about a proper diet from your health care provider, if necessary.  . Regular physical exercise is one of the most important things you can do for your health. Most adults should get at least 150 minutes of moderate-intensity exercise (any activity that increases your heart rate and causes you to sweat) each week. In addition, most adults need muscle-strengthening exercises on 2 or more days a week.  Silver Sneakers may be a benefit available to you. To determine eligibility, you may visit the website: www.silversneakers.com or contact program at (956)[phone] Mon-Fri between 8AM-8PM.   . Maintain a healthy weight. The body mass index (BMI) is a screening tool to identify possible weight problems. It provides an estimate of body fat based on height and weight. Your health care provider can find your BMI and can help you achieve or maintain a healthy weight.   For adults 20 years and older: ? A BMI below 18.5 is considered underweight. ? A BMI of 18.5 to 24.9 is normal. ? A BMI of 25 to 29.9 is considered overweight. ? A BMI of 30 and above is considered obese.   . Maintain normal blood lipids and cholesterol levels by exercising and minimizing your intake of saturated fat. Eat a balanced diet with plenty of fruit and vegetables. Blood  tests for lipids and cholesterol should begin at age 72 and be repeated every 5 years. If your lipid or cholesterol levels are high, you are over 50, or you are at high risk for heart disease, you may need your cholesterol levels checked more frequently. Ongoing high lipid and cholesterol levels should be treated with medicines if diet and exercise are not working.  . If you smoke, find out from your health care provider how to quit. If you do not use tobacco, please do not start.  . If you choose to drink alcohol, please do not consume more than 2 drinks per  day. One drink is considered to be 12 ounces (355 mL) of beer, 5 ounces (148 mL) of wine, or 1.5 ounces (44 mL) of liquor.  . If you are 57-23 years old, ask your health care provider if you should take aspirin to prevent strokes.  . Use sunscreen. Apply sunscreen liberally and repeatedly throughout the day. You should seek shade when your shadow is shorter than you. Protect yourself by wearing long sleeves, pants, a wide-brimmed hat, and sunglasses year round, whenever you are outdoors.  . Once a month, do a whole body skin exam, using a mirror to look at the skin on your back. Tell your health care provider of new moles, moles that have irregular borders, moles that are larger than a pencil eraser, or moles that have changed in shape or color.

## 2018-02-28 LAB — TESTOSTERONE TOTAL,FREE,BIO, MALES
Albumin: 4 g/dL (ref 3.6–5.1)
Sex Hormone Binding: 49 nmol/L (ref 22–77)
Testosterone, Bioavailable: 77.3 ng/dL — ABNORMAL LOW (ref >=110.0)
Testosterone, Free: 42.1 pg/mL — ABNORMAL LOW (ref 46.0–224.0)
Testosterone: 437 ng/dL (ref 250–827)

## 2018-03-02 NOTE — Progress Notes (Signed)
I reviewed health advisor's note, was available for consultation, and agree with documentation and plan.  

## 2018-03-06 ENCOUNTER — Ambulatory Visit (INDEPENDENT_AMBULATORY_CARE_PROVIDER_SITE_OTHER): Payer: Medicare Other | Admitting: Family Medicine

## 2018-03-06 ENCOUNTER — Encounter: Payer: Self-pay | Admitting: Family Medicine

## 2018-03-06 VITALS — BP 130/56 | HR 81 | Temp 98.2°F | Ht 66.5 in | Wt 285.5 lb

## 2018-03-06 DIAGNOSIS — F331 Major depressive disorder, recurrent, moderate: Secondary | ICD-10-CM | POA: Diagnosis not present

## 2018-03-06 DIAGNOSIS — Z0000 Encounter for general adult medical examination without abnormal findings: Secondary | ICD-10-CM | POA: Diagnosis not present

## 2018-03-06 DIAGNOSIS — E113393 Type 2 diabetes mellitus with moderate nonproliferative diabetic retinopathy without macular edema, bilateral: Secondary | ICD-10-CM

## 2018-03-06 DIAGNOSIS — K219 Gastro-esophageal reflux disease without esophagitis: Secondary | ICD-10-CM

## 2018-03-06 DIAGNOSIS — I10 Essential (primary) hypertension: Secondary | ICD-10-CM | POA: Diagnosis not present

## 2018-03-06 DIAGNOSIS — E1365 Other specified diabetes mellitus with hyperglycemia: Secondary | ICD-10-CM

## 2018-03-06 DIAGNOSIS — IMO0002 Reserved for concepts with insufficient information to code with codable children: Secondary | ICD-10-CM

## 2018-03-06 DIAGNOSIS — N522 Drug-induced erectile dysfunction: Secondary | ICD-10-CM | POA: Diagnosis not present

## 2018-03-06 DIAGNOSIS — E559 Vitamin D deficiency, unspecified: Secondary | ICD-10-CM | POA: Diagnosis not present

## 2018-03-06 DIAGNOSIS — R5383 Other fatigue: Secondary | ICD-10-CM | POA: Diagnosis not present

## 2018-03-06 DIAGNOSIS — I2510 Atherosclerotic heart disease of native coronary artery without angina pectoris: Secondary | ICD-10-CM | POA: Diagnosis not present

## 2018-03-06 DIAGNOSIS — I5032 Chronic diastolic (congestive) heart failure: Secondary | ICD-10-CM

## 2018-03-06 DIAGNOSIS — N529 Male erectile dysfunction, unspecified: Secondary | ICD-10-CM | POA: Insufficient documentation

## 2018-03-06 DIAGNOSIS — E785 Hyperlipidemia, unspecified: Secondary | ICD-10-CM

## 2018-03-06 DIAGNOSIS — M79605 Pain in left leg: Secondary | ICD-10-CM

## 2018-03-06 DIAGNOSIS — E1339 Other specified diabetes mellitus with other diabetic ophthalmic complication: Secondary | ICD-10-CM | POA: Diagnosis not present

## 2018-03-06 DIAGNOSIS — M79604 Pain in right leg: Secondary | ICD-10-CM

## 2018-03-06 MED ORDER — DEXLANSOPRAZOLE 60 MG PO CPDR: 60.0000 mg | DELAYED_RELEASE_CAPSULE | Freq: Every day | ORAL | 1 refills | Status: DC

## 2018-03-06 NOTE — Patient Instructions (Addendum)
Stop pantoprazole.. Try dexilant instead. Can still use zantac twice daily as needed for breakthrough reflux.  Start stretching of legs and heat on low back.  Stop sweets in diet, increase exercise.. Walk more. Once done vit D 50,000 weekly.. Then change to OTC vit D 4000 IU unit daily.

## 2018-03-06 NOTE — Assessment & Plan Note (Signed)
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 Not clearly linked to chronic low back pain.  treat with tylenol, heat and stretching.

## 2018-03-06 NOTE — Assessment & Plan Note (Signed)
At goal on crestor.. Tolerating well.

## 2018-03-06 NOTE — Assessment & Plan Note (Signed)
Stable and unchanged per pt. Multifactorial. Encouraged exercise, weight loss, healthy eating habits.

## 2018-03-06 NOTE — Assessment & Plan Note (Signed)
Stop pantoprazole and try a trial of dexilant.

## 2018-03-06 NOTE — Assessment & Plan Note (Signed)
Resolved with supplementation.[name]. cahnge to OTC when course completed.

## 2018-03-06 NOTE — Progress Notes (Signed)
Subjective:    Patient ID: [name], male    DOB: [birth year], 67 y.o.   MRN: [id number]  HPI   The patient presents for annual medicare wellness, complete physical and review of chronic health problems. He/She also has the following acute concerns today: ED, GERD, low back pain and thigh   The patient saw Lu Duffel, LPN for medicare wellness. Note reviewed in detail and important notes copied below.  Health maintenance:  Tetanus vaccine - postponed/insurance PPSV23 - administered  Abnormal screenings:   Hearing: failed             Hearing Screening   125Hz  250Hz  500Hz  1000Hz  2000Hz  3000Hz  4000Hz  6000Hz  8000Hz   Right ear:   40 40 40  0    Left ear:   40 40 40  0      Falls: hx of multiple falls Fall Risk  02/27/2018 02/21/2017 11/20/2015 11/20/2015 12/21/2012  Falls in the past year? Yes No No No No  Comment fell while walking dog; fell at church after missing a step - - - -  Number falls in past yr: 2 or more - - - -  Injury with Fall? Yes - - - -  Risk Factor Category  High Fall Risk - - - -    Depression score: 10 Depression screen [facility] 2/9 02/27/2018 02/21/2017 10/10/2016 11/20/2015 11/20/2015  Decreased Interest 1 1 3  0 0  Down, Depressed, Hopeless 3 2 3  0 0  PHQ - 2 Score 4 3 6  0 0  Altered sleeping 2 3 3  - -  Tired, decreased energy 3 3 3  - -  Change in appetite 0 0 0 - -  Feeling bad or failure about yourself  1 2 3  - -  Trouble concentrating 0 0 2 - -  Moving slowly or fidgety/restless 0 0 0 - -  Suicidal thoughts 0 0 0 - -  PHQ-9 Score 10 11 17  - -  Difficult doing work/chores Not difficult at all Not difficult at all - - -    Patient concerns:   Patient voiced several concerns regarding erectile dysfunction. States prescribed ED medication was ineffective.  Patient states GERD medication is ineffective.  Patient reports concerns with chronic fatigue.   03/06/18 TODAY:   Has been  Mowing lawns on riding mower.. In last 3  days he has had low back pain radiating to bilateral thighs. Thigh tight.  Tylenol does not help pain. Gradually improving some.  No numbness, no weakness.  no falls.  Hx of chronic back pain.   Diabetes:   Worsened from last check at 9.1 in 12/2017 He states he cannot afford any other DM med. Lab Results  Component Value Date   HGBA1C 9.9 (H) 02/27/2018  Using medications without difficulties: Hypoglycemic episodes:none Hyperglycemic episodes: at night Feet problems:no ulcers Blood Sugars averaging: 97-119 eye exam within last year:  CAD followed by Dr. Mariah Milling  Diastolic CHF: euvolemic on exam today.  Vit D def now resolved.   Total testosterone in Nml range.  Elevated Cholesterol:  LDL at goal < 70 on crestor Lab Results  Component Value Date   CHOL 99 02/27/2018   HDL 32.40 (L) 02/27/2018   LDLCALC 50 02/27/2018   LDLDIRECT 174.0 10/19/2017   TRIG 85.0 02/27/2018   CHOLHDL 3 02/27/2018  Using medications without problems: Muscle aches:  Diet compliance: working on healthy eating Exercise: trying walk some until hurt his back. Other complaints: Wt Readings from Last 3 Encounters:  03/06/18 285 lb 8 oz (129.5 kg)  02/27/18 284 lb 8 oz (129 kg)  01/16/18 288 lb (130.6 kg)    MDD:  Gradually improiving.. Trying to do more.. Get out of bed.  Fatigue chronic is stable not worse not much better but he is used to it.    GERD:  Pantoprazole in addition to zantac not helping.   Chronic chest pain: helped with nitroglycerin   ED: multifactorial.. Due to obesity, meds, DM, CAD etc.   Social History /Family History/Past Medical History reviewed in detail and updated in EMR if needed. Blood pressure (!) 130/56, pulse 81, temperature 98.2 F (36.8 C), temperature source Oral, height 5' 6.5" (1.689 m), weight 285 lb 8 oz (129.5 kg), SpO2 98 %.    Review of Systems  Constitutional: Negative for fatigue and fever.  HENT: Negative for ear pain.   Eyes: Negative for  pain.  Respiratory: Negative for cough and shortness of breath.   Cardiovascular: Negative for chest pain, palpitations and leg swelling.  Gastrointestinal: Negative for abdominal pain.  Genitourinary: Negative for dysuria.  Musculoskeletal: Negative for arthralgias.  Neurological: Negative for syncope, light-headedness and headaches.  Psychiatric/Behavioral: Negative for dysphoric mood.       Objective:   Physical Exam  Constitutional: He appears well-developed and well-nourished.  Non-toxic appearance. He does not appear ill. No distress.  HENT:  Head: Normocephalic and atraumatic.  Right Ear: Hearing, tympanic membrane, external ear and ear canal normal.  Left Ear: Hearing, tympanic membrane, external ear and ear canal normal.  Nose: Nose normal.  Mouth/Throat: Uvula is midline, oropharynx is clear and moist and mucous membranes are normal.  Eyes: Pupils are equal, round, and reactive to light. Conjunctivae, EOM and lids are normal. Lids are everted and swept, no foreign bodies found.  Neck: Trachea normal, normal range of motion and phonation normal. Neck supple. Carotid bruit is not present. No thyroid mass and no thyromegaly present.  Cardiovascular: Normal rate, regular rhythm, S1 normal, S2 normal, intact distal pulses and normal pulses. Exam reveals no gallop.  No murmur heard. Pulmonary/Chest: Breath sounds normal. He has no wheezes. He has no rhonchi. He has no rales.  Abdominal: Soft. Normal appearance and bowel sounds are normal. There is no hepatosplenomegaly. There is no tenderness. There is no rebound, no guarding and no CVA tenderness. No hernia.  Musculoskeletal:       Lumbar back: He exhibits tenderness. He exhibits normal range of motion and no bony tenderness.  Bilateral thigh tightness.  Lymphadenopathy:    He has no cervical adenopathy.  Neurological: He is alert. He has normal strength and normal reflexes. No cranial nerve deficit or sensory deficit. Gait  normal.  Skin: Skin is warm, dry and intact. No rash noted.  Psychiatric: He has a normal mood and affect. His speech is normal and behavior is normal. Judgment normal.      Diabetic foot exam: Normal inspection No skin breakdown No calluses  Normal DP pulses Normal sensation to light touch and monofilament Nails normal     Assessment & Plan:  [name] preventative maintenance and recommended screening tests for an annual wellness exam were reviewed in full today. Brought up to date unless services declined.  Counselled on the importance of diet, exercise, and its role in overall health and mortality. [name] FH and SH was reviewed, including their home life, tobacco status, and drug and alcohol status.    Refused tetanus vaccine. uptodate with eye exam Colonoscopy 2011 due  in 2021 Lab Results  Component Value Date   PSA 0.35 02/21/2017   PSA 0.27 10/19/2015   PSA 0.25 02/22/2013   Will check PSA not lab check.

## 2018-03-06 NOTE — Assessment & Plan Note (Signed)
Euvolemic on exam today. 

## 2018-03-06 NOTE — Assessment & Plan Note (Signed)
Followed by opthalmology. Due to DM.

## 2018-03-06 NOTE — Assessment & Plan Note (Signed)
Poor control.. Pt refuses med cahnge.Marland Kitchen. He states it is " all from my diet. I need to stop the sweets" He is losing weight and continues to want to change his diet. Also encouraged increase in exercise.

## 2018-03-09 ENCOUNTER — Telehealth: Payer: Self-pay | Admitting: Family Medicine

## 2018-03-09 MED ORDER — RABEPRAZOLE SODIUM 20 MG PO TBEC: 20.0000 mg | DELAYED_RELEASE_TABLET | Freq: Every day | ORAL | 3 refills | Status: DC

## 2018-03-09 MED ORDER — RABEPRAZOLE SODIUM 20 MG PO TBEC: 20.0000 mg | DELAYED_RELEASE_TABLET | Freq: Every day | ORAL | 11 refills | Status: DC

## 2018-03-09 NOTE — Telephone Encounter (Signed)
Pharmacist at [facility] notified as instructed by telephone. Aciphex resent to [facility] and I cancelled prescription that was [facility] in [facility].

## 2018-03-09 NOTE — Telephone Encounter (Signed)
D/C dexilant Wil send in new rx.

## 2018-03-09 NOTE — Telephone Encounter (Signed)
Copied from CRM [id number]. Topic: Quick Communication - See Telephone Encounter >> Mar 09, 2018 12:25 PM Oneal GroutSebastian, Jennifer S wrote: CRM for notification. See Telephone encounter for: 03/09/18. Drug interaction with clopidogrel (PLAVIX) 75 MG tablet and dexlansoprazole (DEXILANT) 60 MG capsule. Please advise Ref # [id number]

## 2018-03-13 NOTE — Telephone Encounter (Signed)
This was a duplicate request for Aciphex refill through [facility]

## 2018-03-16 NOTE — Telephone Encounter (Signed)
[facility] needs clarification on directions for clopidogrel (PLAVIX) 75 MG tablet, call back @ [phone] reference number [id number]

## 2018-03-16 NOTE — Telephone Encounter (Signed)
Wanted to make sure the doctor was aware of the possible interaction between Aciphex and Plavix. I advised them she was aware.

## 2018-04-06 ENCOUNTER — Telehealth: Payer: Self-pay | Admitting: *Deleted

## 2018-04-06 NOTE — Telephone Encounter (Addendum)
Received fax from [facility] stating PPIs may reduce the antiplatelet activity of Plavix.  Per Dr. Ermalene SearingBedsole:  [name] was notified by telephone to stop the Aciphex because it can decrease the effectiveness of his Plavix.  I instructed him to change back to pantoprazole, which patient still has.  If the pantoprazole does not help at all, I ask that he call us back.  We can then change to either Prevacid or Dexilant.  Patient states understanding.  Medication list updated. [facility] notified of change via fax.

## 2018-04-06 NOTE — Addendum Note (Signed)
Addended by: Damita LackLORING, Niko Penson S on: 04/06/2018 12:51 PM   Modules accepted: Orders

## 2018-04-10 ENCOUNTER — Ambulatory Visit (INDEPENDENT_AMBULATORY_CARE_PROVIDER_SITE_OTHER): Payer: Medicare Other | Admitting: Family Medicine

## 2018-04-10 ENCOUNTER — Telehealth: Payer: Self-pay

## 2018-04-10 ENCOUNTER — Encounter: Payer: Self-pay | Admitting: Family Medicine

## 2018-04-10 DIAGNOSIS — L309 Dermatitis, unspecified: Secondary | ICD-10-CM | POA: Diagnosis not present

## 2018-04-10 DIAGNOSIS — K219 Gastro-esophageal reflux disease without esophagitis: Secondary | ICD-10-CM | POA: Diagnosis not present

## 2018-04-10 DIAGNOSIS — W57XXXA Bitten or stung by nonvenomous insect and other nonvenomous arthropods, initial encounter: Secondary | ICD-10-CM | POA: Diagnosis not present

## 2018-04-10 DIAGNOSIS — B354 Tinea corporis: Secondary | ICD-10-CM | POA: Diagnosis not present

## 2018-04-10 DIAGNOSIS — S30861A Insect bite (nonvenomous) of abdominal wall, initial encounter: Secondary | ICD-10-CM

## 2018-04-10 MED ORDER — TRIAMCINOLONE ACETONIDE 0.5 % EX CREA: 1.0000 "application " | TOPICAL_CREAM | Freq: Two times a day (BID) | CUTANEOUS | 1 refills | Status: DC

## 2018-04-10 MED ORDER — CLOTRIMAZOLE 1 % EX CREA
1.0000 | TOPICAL_CREAM | Freq: Two times a day (BID) | CUTANEOUS | 2 refills | Status: DC
Start: 2018-04-10 — End: 2018-06-26

## 2018-04-10 MED ORDER — LANSOPRAZOLE 30 MG PO CPDR: 30.0000 mg | DELAYED_RELEASE_CAPSULE | Freq: Every day | ORAL | 3 refills | Status: DC

## 2018-04-10 MED ORDER — RABEPRAZOLE SODIUM 20 MG PO TBEC: 20.0000 mg | DELAYED_RELEASE_TABLET | Freq: Every day | ORAL | 0 refills | Status: DC

## 2018-04-10 NOTE — Patient Instructions (Addendum)
Stop pantoprazole to aciphex. Can use zantac OTC for symtpoms twice daily as needed   Call if not improving chest pain and reflux in 2 weeks for referral to GI .  Call  If you have new onset fever, neck stiffness, numbness, new neurologic change in next 1-2 weeks.   Tick Bite Information, Adult Ticks are insects that draw blood for food. Most ticks live in shrubs and grassy areas. They climb onto people and animals that brush against the leaves and grasses that they rest on. Then they bite, attaching themselves to the skin. Most ticks are harmless, but some ticks carry germs that can spread to a person through a bite and cause a disease. To reduce your risk of getting a disease from a tick bite, it is important to take steps to prevent tick bites. It is also important to check for ticks after being outdoors. If you find that a tick has attached to you, watch for symptoms of disease. How can I prevent tick bites? Take these steps to help prevent tick bites when you are outdoors in an area where ticks are found:  Use insect repellent that has DEET (20% or higher), picaridin, or IR3535 in it. Use it on: ? Skin that is showing. ? The top of your boots. ? Your pant legs. ? Your sleeve cuffs.  For repellent products that contain permethrin, follow product instructions. Use these products on: ? Clothing. ? Gear. ? Boots. ? Tents.  Wear protective clothing. Long sleeves and long pants offer the best protection from ticks.  Wear light-colored clothing so you can see ticks more easily.  Tuck your pant legs into your socks.  If you go walking on a trail, stay in the middle of the trail so your skin, hair, and clothing do not touch the bushes.  Avoid walking through areas with long grass.  Check for ticks on your clothing, hair, and skin often while you are outside, and check again before you go inside. Make sure to check the places that ticks attach themselves most often. These places include  the scalp, neck, armpits, waist, groin, and joint areas. Ticks that carry a disease called Lyme disease have to be attached to the skin for 24-48 hours. Checking for ticks every day will lessen your risk of this and other diseases.  When you come indoors, wash your clothes and take a shower or a bath right away. Dry your clothes in a dryer on high heat for at least 60 minutes. This will kill any ticks in your clothes.  What is the proper way to remove a tick? If you find a tick on your body, remove it as soon as possible. Removing a tick sooner rather than later can prevent germs from passing from the tick to your body. To remove a tick that is crawling on your skin but has not bitten:  Go outdoors and brush the tick off.  Remove the tick with tape or a lint roller.  To remove a tick that is attached to your skin:  Wash your hands.  If you have latex gloves, put them on.  Use tweezers, curved forceps, or a tick-removal tool to gently grasp the tick as close to your skin and the tick's head as possible.  Gently pull with steady, upward pressure until the tick lets go. When removing the tick: ? Take care to keep the tick's head attached to its body. ? Do not twist or jerk the tick. This can make  the tick's head or mouth break off. ? Do not squeeze or crush the tick's body. This could force disease-carrying fluids from the tick into your body.  Do not try to remove a tick with heat, alcohol, petroleum jelly, or fingernail polish. Using these methods can cause the tick to salivate and regurgitate into your bloodstream, increasing your risk of getting a disease. What should I do after removing a tick?  Clean the bite area with soap and water, rubbing alcohol, or an iodine scrub.  If an antiseptic cream or ointment is available, apply a small amount to the bite site.  Wash and disinfect any instruments that you used to remove the tick. How should I dispose of a tick? To dispose of a live  tick, use one of these methods:  Place it in rubbing alcohol.  Place it in a sealed bag or container.  Wrap it tightly in tape.  Flush it down the toilet.  Contact a health care provider if:  You have symptoms of a disease after a tick bite. Symptoms of a tick-borne disease can occur from moments after the tick bites to up to 30 days after a tick is removed. Symptoms include: ? Muscle, joint, or bone pain. ? Difficulty walking or moving your legs. ? Numbness in the legs. ? Paralysis. ? Red rash around the tick bite area that is shaped like a target or a "bull's-eye." ? Redness and swelling in the area of the tick bite. ? Fever. ? Repeated vomiting. ? Diarrhea. ? Weight loss. ? Tender, swollen lymph glands. ? Shortness of breath. ? Cough. ? Pain in the abdomen. ? Headache. ? Abnormal tiredness. ? A change in your level of consciousness. ? Confusion. Get help right away if:  You are not able to remove a tick.  A part of a tick breaks off and gets stuck in your skin.  Your symptoms get worse. Summary  Ticks may carry germs that can spread to a person through a bite and cause disease.  Wear protective clothing and use insect repellent to prevent tick bites. Follow product instructions.  If you find a tick on your body, remove it as soon as possible. If the tick is attached, do not try to remove with heat, alcohol, petroleum jelly, or fingernail polish.  Remove the attached tick using tweezers, curved forceps, or a tick-removal tool. Gently pull with steady, upward pressure until the tick lets go. Do not twist or jerk the tick. Do not squeeze or crush the tick's body.  If you have symptoms after being bitten by a tick, contact a health care provider. This information is not intended to replace advice given to you by your health care provider. Make sure you discuss any questions you have with your health care provider. Document Released: 08/12/2000 Document Revised:  05/27/2016 Document Reviewed: 05/27/2016 Elsevier Interactive Patient Education  [employer]2018 Elsevier Inc.

## 2018-04-10 NOTE — Assessment & Plan Note (Addendum)
Stop aciphex given interaction with plavix. Trial of  Prevacid . If not improving will refer to GI for ENDO.

## 2018-04-10 NOTE — Progress Notes (Signed)
   Subjective:    Patient ID: William HuskJohn H [name], male    DOB: Nov 11, 1950, 67 y.o.   MRN: [id number]  HPI  67 year old male presents for new onset tick bite.   He reports 2 weeks tick bite on right flank, small brown. Not sure how long attached. No headache, no fever. No new rash. No neck stiffness. No joint swelling. Feeling well overall except for below.     He also continues to have chest pain and reflux despite protonix and zantac.  he has been on both of these now for > 2 months.he was much better with symptoms on aciphex but had some achiness on it.  ( He is on plavix and cannot take aciphex)  Last endoscopy 2010 with  Dr. Juanda ChanceBrodie:  Showed moderate  Acute on chronic gastritis, no malignancy on path. Neg Hpylori.  Can try prevacid or dexilant.. May need PA.   Review of Systems  Constitutional: Negative for fatigue and fever.  HENT: Negative for ear pain.   Eyes: Negative for pain.  Respiratory: Negative for cough and shortness of breath.   Cardiovascular: Negative for chest pain, palpitations and leg swelling.  Gastrointestinal: Negative for abdominal pain.  Genitourinary: Negative for dysuria.  Musculoskeletal: Negative for arthralgias.  Skin: Positive for rash.  Neurological: Negative for syncope, light-headedness and headaches.  Psychiatric/Behavioral: Negative for dysphoric mood.       Objective:   Physical Exam  Constitutional: Vital signs are normal. He appears well-developed and well-nourished.  HENT:  Head: Normocephalic.  Right Ear: Hearing normal.  Left Ear: Hearing normal.  Nose: Nose normal.  Mouth/Throat: Oropharynx is clear and moist and mucous membranes are normal.  Neck: Trachea normal. Carotid bruit is not present. No thyroid mass and no thyromegaly present.  Cardiovascular: Normal rate, regular rhythm and normal pulses. Exam reveals no gallop, no distant heart sounds and no friction rub.  No murmur heard. No peripheral edema  Pulmonary/Chest: Effort  normal and breath sounds normal. No respiratory distress.  Skin: Skin is warm, dry and intact. No rash noted.  Fungal infection ( leading edge on right arm superimposed on chronic dermatitis).  Fungal rash on leg improving but not yet gone, right.  Scabs on scalp.  Small scab on right mid back at site of tick bite, no surrounding erythema.  Psychiatric: He has a normal mood and affect. His speech is normal and behavior is normal. Thought content normal.          Assessment & Plan:

## 2018-04-10 NOTE — Telephone Encounter (Signed)
Ladona Ridgelaylor called [facility] for clarification; pt was seen 04/10/18; [facility]; Ladona Ridgelaylor said pt was to stop aciphex and try prevacid. Under Assessment and Plan has stop aciphex given interaction with Plavix;trial of Prevacid. But AVS has stop pantoprazole to aciphex. Please advise.

## 2018-04-10 NOTE — Telephone Encounter (Signed)
Pt is to stop pantoprazole. Remain off aciphex.. Already stopped.  Start a trial of prevacid.. D/C rx for aciphex and I will send in prevacid.

## 2018-04-10 NOTE — Assessment & Plan Note (Signed)
Apply topical steroid to atrm and scalp.. If not improving will refer to derm.

## 2018-04-10 NOTE — Telephone Encounter (Signed)
I spoke with [name] at [facility] and he d/c aciphex rx and understands pt is to stop pantoprazole, remain off aciphex and start trial of prevacid.nothing further needed.

## 2018-04-10 NOTE — Assessment & Plan Note (Signed)
Resolving. Can apply triamcinolone cream BID. No S/S of tick illness. INfo on tick bites provided and reviewed in detail.

## 2018-04-10 NOTE — Assessment & Plan Note (Signed)
Complete course of topical antifungal  On right arm and right leg. If not improving needs derm Bx.

## 2018-04-25 ENCOUNTER — Other Ambulatory Visit: Payer: Self-pay | Admitting: Family Medicine

## 2018-06-05 ENCOUNTER — Ambulatory Visit: Payer: Medicare Other | Admitting: Family Medicine

## 2018-06-13 ENCOUNTER — Other Ambulatory Visit: Payer: Self-pay | Admitting: Family Medicine

## 2018-06-22 ENCOUNTER — Other Ambulatory Visit (INDEPENDENT_AMBULATORY_CARE_PROVIDER_SITE_OTHER): Payer: Medicare Other

## 2018-06-22 ENCOUNTER — Telehealth: Payer: Self-pay | Admitting: Family Medicine

## 2018-06-22 DIAGNOSIS — IMO0002 Reserved for concepts with insufficient information to code with codable children: Secondary | ICD-10-CM

## 2018-06-22 DIAGNOSIS — E1339 Other specified diabetes mellitus with other diabetic ophthalmic complication: Secondary | ICD-10-CM | POA: Diagnosis not present

## 2018-06-22 DIAGNOSIS — E1365 Other specified diabetes mellitus with hyperglycemia: Secondary | ICD-10-CM

## 2018-06-22 LAB — COMPREHENSIVE METABOLIC PANEL
ALT: 21 U/L (ref 0–53)
AST: 17 U/L (ref 0–37)
Albumin: 3.8 g/dL (ref 3.5–5.2)
Alkaline Phosphatase: 85 U/L (ref 39–117)
BUN: 16 mg/dL (ref 6–23)
CO2: 29 mEq/L (ref 19–32)
Calcium: 9.3 mg/dL (ref 8.4–10.5)
Chloride: 103 mEq/L (ref 96–112)
Creatinine, Ser: 0.93 mg/dL (ref 0.40–1.50)
GFR: 85.72 mL/min (ref >60.00)
Glucose, Bld: 247 mg/dL — ABNORMAL HIGH (ref 70–99)
Potassium: 4.2 mEq/L (ref 3.5–5.1)
Sodium: 141 mEq/L (ref 135–145)
Total Bilirubin: 0.4 mg/dL (ref 0.2–1.2)
Total Protein: 6.8 g/dL (ref 6.0–8.3)

## 2018-06-22 LAB — LIPID PANEL
Cholesterol: 90 mg/dL (ref 0–200)
HDL: 30.8 mg/dL — ABNORMAL LOW (ref >39.00)
LDL Cholesterol: 39 mg/dL (ref 0–99)
NonHDL: 59.61
Total CHOL/HDL Ratio: 3
Triglycerides: 102 mg/dL (ref 0.0–149.0)
VLDL: 20.4 mg/dL (ref 0.0–40.0)

## 2018-06-22 LAB — HEMOGLOBIN A1C: Hgb A1c MFr Bld: 10 % — ABNORMAL HIGH (ref 4.6–6.5)

## 2018-06-22 NOTE — Telephone Encounter (Signed)
-----   Message from Alvina Chou sent at 06/15/2018 10:02 AM EDT ----- Regarding: Lab orders for Friday, 10.25.19 Lab orders for a dm appt

## 2018-06-26 ENCOUNTER — Encounter: Payer: Self-pay | Admitting: Family Medicine

## 2018-06-26 ENCOUNTER — Ambulatory Visit (INDEPENDENT_AMBULATORY_CARE_PROVIDER_SITE_OTHER): Payer: Medicare Other | Admitting: Family Medicine

## 2018-06-26 VITALS — BP 134/60 | HR 83 | Temp 98.1°F | Ht 66.5 in | Wt 280.5 lb

## 2018-06-26 DIAGNOSIS — E113393 Type 2 diabetes mellitus with moderate nonproliferative diabetic retinopathy without macular edema, bilateral: Secondary | ICD-10-CM

## 2018-06-26 DIAGNOSIS — E785 Hyperlipidemia, unspecified: Secondary | ICD-10-CM

## 2018-06-26 DIAGNOSIS — H34812 Central retinal vein occlusion, left eye, with macular edema: Secondary | ICD-10-CM | POA: Diagnosis not present

## 2018-06-26 DIAGNOSIS — F331 Major depressive disorder, recurrent, moderate: Secondary | ICD-10-CM

## 2018-06-26 DIAGNOSIS — H348112 Central retinal vein occlusion, right eye, stable: Secondary | ICD-10-CM | POA: Diagnosis not present

## 2018-06-26 DIAGNOSIS — E113312 Type 2 diabetes mellitus with moderate nonproliferative diabetic retinopathy with macular edema, left eye: Secondary | ICD-10-CM | POA: Diagnosis not present

## 2018-06-26 DIAGNOSIS — E113391 Type 2 diabetes mellitus with moderate nonproliferative diabetic retinopathy without macular edema, right eye: Secondary | ICD-10-CM | POA: Diagnosis not present

## 2018-06-26 DIAGNOSIS — E1339 Other specified diabetes mellitus with other diabetic ophthalmic complication: Secondary | ICD-10-CM

## 2018-06-26 DIAGNOSIS — E1365 Other specified diabetes mellitus with hyperglycemia: Secondary | ICD-10-CM

## 2018-06-26 DIAGNOSIS — IMO0002 Reserved for concepts with insufficient information to code with codable children: Secondary | ICD-10-CM

## 2018-06-26 MED ORDER — VENLAFAXINE HCL ER 75 MG PO CP24: ORAL_CAPSULE | ORAL | 5 refills | Status: DC

## 2018-06-26 MED ORDER — METFORMIN HCL ER 500 MG PO TB24: 500.0000 mg | ORAL_TABLET | Freq: Every day | ORAL | 11 refills | Status: DC

## 2018-06-26 NOTE — Assessment & Plan Note (Signed)
Poor control. NO SE to venlafaxine and helped in past... Try trial of increased dose to 225 mg daily. Follow up in 1 month.

## 2018-06-26 NOTE — Assessment & Plan Note (Signed)
LDL at goal on statin. 

## 2018-06-26 NOTE — Assessment & Plan Note (Signed)
Encouraged exercise, weight loss, healthy eating habits. ? ?

## 2018-06-26 NOTE — Patient Instructions (Addendum)
Take an additional 75 mg XL  In addition to the 150 mg XL already taking to get to a dose of 225 mg daily.  Start low dose metformin in addition to insulin. Work on low [employer], increase exercise.

## 2018-06-26 NOTE — Progress Notes (Signed)
   Subjective:    Patient ID: [name], male    DOB: 07-25-1951, 67 y.o.   MRN: [id number]  HPI    67 year old male resents for follow up DM   he reports he is too depressed to try to work on lifestyle change. He not sleeping at night, not leaving house. Watching TV all day.  Lost wife 22 months ago. Venlafaxine 150 mg daily is not helping.  Diabetes:   Inadequate control on Insulin 70/30 70 units in AM and 40 units in PM.  Could not afford victoza. Actos did not help much in PAST Lab Results  Component Value Date   HGBA1C 10.0 (H) 06/22/2018  Using medications without difficulties: Hypoglycemic episodes:none Hyperglycemic episodes:many Feet problems: no ulcer. Blood Sugars averaging: > 200 eye exam within last year: 01/2018  Elevated Cholesterol:  LDL at goal on crestor 40 mg daily. Lab Results  Component Value Date   CHOL 90 06/22/2018   HDL 30.80 (L) 06/22/2018   LDLCALC 39 06/22/2018   LDLDIRECT 174.0 10/19/2017   TRIG 102.0 06/22/2018   CHOLHDL 3 06/22/2018  Using medications without problems: Muscle aches:  Diet compliance: poor Exercise: none Other complaints:    Social History /Family History/Past Medical History reviewed in detail and updated in EMR if needed. Blood pressure 134/60, pulse 83, temperature 98.1 F (36.7 C), temperature source Oral, height 5' 6.5" (1.689 m), weight 280 lb 8 oz (127.2 kg).   Review of Systems  Constitutional: Negative for fatigue and fever.  HENT: Negative for ear pain.   Eyes: Negative for pain.  Respiratory: Negative for cough and shortness of breath.   Cardiovascular: Negative for chest pain, palpitations and leg swelling.  Gastrointestinal: Negative for abdominal pain.  Genitourinary: Negative for dysuria.  Musculoskeletal: Negative for arthralgias.  Neurological: Negative for syncope, light-headedness and headaches.  Psychiatric/Behavioral: Negative for dysphoric mood.       Objective:   Physical Exam    Constitutional: He is oriented to person, place, and time. Vital signs are normal. He appears well-developed and well-nourished.  Morbid obesity   HENT:  Head: Normocephalic.  Right Ear: Hearing normal.  Left Ear: Hearing normal.  Nose: Nose normal.  Mouth/Throat: Oropharynx is clear and moist and mucous membranes are normal.  Neck: Trachea normal. Carotid bruit is not present. No thyroid mass and no thyromegaly present.  Cardiovascular: Normal rate, regular rhythm and normal pulses. Exam reveals no gallop, no distant heart sounds and no friction rub.  No murmur heard. No peripheral edema  Pulmonary/Chest: Effort normal and breath sounds normal. No respiratory distress.  Neurological: He is alert and oriented to person, place, and time.  Skin: Skin is warm, dry and intact. No rash noted.  thickened dry skin on hands dorsally and dorsal arms.. Excoriations and scar where chronic picking.  Scabs on scalp, no erthema  sun damage to skin on arms Leading edge to rash on right leg and feet, dry flaky skin  Psychiatric: His speech is normal. Judgment and thought content normal. His affect is blunt. He is withdrawn. Cognition and memory are normal.          Assessment & Plan:

## 2018-06-26 NOTE — Assessment & Plan Note (Signed)
Add low dose metformin ( had diarrhea in past) .Marland Kitchen As insulin sensitizer.. Will try to increase as tolerated.

## 2018-07-24 ENCOUNTER — Ambulatory Visit: Payer: Medicare Other | Admitting: Family Medicine

## 2018-08-28 ENCOUNTER — Other Ambulatory Visit: Payer: Self-pay | Admitting: Family Medicine

## 2018-08-30 NOTE — Telephone Encounter (Signed)
Okay to refill.. I cannot sign refill given the way it was put in for some reason.

## 2018-08-30 NOTE — Telephone Encounter (Signed)
Last office visit 06/26/2018 for DM.  Not on current medication list.  Refill?

## 2018-09-11 ENCOUNTER — Ambulatory Visit (INDEPENDENT_AMBULATORY_CARE_PROVIDER_SITE_OTHER): Payer: Medicare Other | Admitting: Family Medicine

## 2018-09-11 ENCOUNTER — Encounter: Payer: Self-pay | Admitting: Family Medicine

## 2018-09-11 VITALS — BP 130/84 | HR 88 | Temp 98.3°F | Ht 66.5 in | Wt 279.5 lb

## 2018-09-11 DIAGNOSIS — B354 Tinea corporis: Secondary | ICD-10-CM

## 2018-09-11 MED ORDER — CLOTRIMAZOLE 1 % EX CREA: 1.0000 "application " | TOPICAL_CREAM | Freq: Two times a day (BID) | CUTANEOUS | 0 refills | Status: DC

## 2018-09-11 NOTE — Progress Notes (Signed)
   Subjective:    Patient ID: [name], male    DOB: 16-Jul-1951, 68 y.o.   MRN: [id number]  HPI   68 year old obese male  Presents for new onset rash on right upper arm in last 1-2 weeks.  it is very itchy.. starts a small circle and increases in size.  He has been using lotion OTC and steroid cream on it... itching better.Marland Kitchen spreading.  No known exposoure... except new sheet. No fever.  No new meds.   Social History /Family History/Past Medical History reviewed in detail and updated in EMR if needed. Blood pressure 130/84, pulse 88, temperature 98.3 F (36.8 C), temperature source Oral, height 5' 6.5" (1.689 m), weight 279 lb 8 oz (126.8 kg).   Review of Systems  Constitutional: Negative for fatigue and fever.  HENT: Negative for ear pain.   Eyes: Negative for pain.  Respiratory: Negative for cough and shortness of breath.   Cardiovascular: Negative for chest pain, palpitations and leg swelling.  Gastrointestinal: Negative for abdominal pain.  Genitourinary: Negative for dysuria.  Musculoskeletal: Negative for arthralgias.  Neurological: Negative for syncope, light-headedness and headaches.  Psychiatric/Behavioral: Negative for dysphoric mood.       Objective:   Physical Exam Constitutional:      Appearance: He is well-developed.  HENT:     Head: Normocephalic.     Right Ear: Hearing normal.     Left Ear: Hearing normal.     Nose: Nose normal.  Neck:     Thyroid: No thyroid mass or thyromegaly.     Vascular: No carotid bruit.     Trachea: Trachea normal.  Cardiovascular:     Rate and Rhythm: Normal rate and regular rhythm.     Pulses: Normal pulses.     Heart sounds: Heart sounds not distant. No murmur. No friction rub. No gallop.      Comments: No peripheral edema Pulmonary:     Effort: Pulmonary effort is normal. No respiratory distress.     Breath sounds: Normal breath sounds.  Skin:    General: Skin is warm and dry.     Findings: No rash.   Comments: Right lower arm with dermatitis/psoriasis   New rash... multiple ring lesions.. central clearing and  Raised borders.  Psychiatric:        Speech: Speech normal.        Behavior: Behavior normal.        Thought Content: Thought content normal.           Assessment & Plan:

## 2018-09-11 NOTE — Patient Instructions (Signed)
Body Ringworm  Body ringworm is an infection of the skin that often causes a ring-shaped rash. Body ringworm can affect any part of your skin. It can spread easily to others. Body ringworm is also called tinea corporis.  What are the causes?  This condition is caused by funguses called dermatophytes. The condition develops when these funguses grow out of control on the skin.  You can get this condition if you touch a person or animal that has it. You can also get it if you share clothing, bedding, towels, or any other object with an infected person or pet.  What increases the risk?  This condition is more likely to develop in:   Athletes who often make skin-to-skin contact with other athletes, such as wrestlers.   People who share equipment and mats.   People with a weakened immune system.  What are the signs or symptoms?  Symptoms of this condition include:   Itchy, raised red spots and bumps.   Red scaly patches.   A ring-shaped rash. The rash may have:  ? A clear center.  ? Scales or red bumps at its center.  ? Redness near its borders.  ? Dry and scaly skin on or around it.  How is this diagnosed?  This condition can usually be diagnosed with a skin exam. A skin scraping may be taken from the affected area and examined under a microscope to see if the fungus is present.  How is this treated?  This condition may be treated with:   An antifungal cream or ointment.   An antifungal shampoo.   Antifungal medicines. These may be prescribed if your ringworm is severe, keeps coming back, or lasts a long time.  Follow these instructions at home:   Take over-the-counter and prescription medicines only as told by your health care provider.   If you were given an antifungal cream or ointment:  ? Use it as told by your health care provider.  ? Wash the infected area and dry it completely before applying the cream or ointment.   If you were given an antifungal shampoo:  ? Use it as told by your health care  provider.  ? Leave the shampoo on your body for 3-5 minutes before rinsing.   While you have a rash:  ? Wear loose clothing to stop clothes from rubbing and irritating it.  ? Wash or change your bed sheets every night.   If your pet has the same infection, take your pet to see a veterinarian.  How is this prevented?   Practice good hygiene.   Wear sandals or shoes in public places and showers.   Do not share personal items with others.   Avoid touching red patches of skin on other people.   Avoid touching pets that have bald spots.   If you touch an animal that has a bald spot, wash your hands.  Contact a health care provider if:   Your rash continues to spread after 7 days of treatment.   Your rash is not gone in 4 weeks.   The area around your rash gets red, warm, tender, and swollen.  This information is not intended to replace advice given to you by your health care provider. Make sure you discuss any questions you have with your health care provider.  Document Released: 08/12/2000 Document Revised: 01/21/2016 Document Reviewed: 06/11/2015  Elsevier Interactive Patient Education  2019 Elsevier Inc.

## 2018-10-01 ENCOUNTER — Other Ambulatory Visit: Payer: Self-pay | Admitting: Cardiovascular Disease

## 2018-10-02 ENCOUNTER — Encounter: Payer: Self-pay | Admitting: Family Medicine

## 2018-10-02 ENCOUNTER — Telehealth: Payer: Self-pay

## 2018-10-02 LAB — HEMOGLOBIN A1C: Hemoglobin A1C: 9.6

## 2018-10-02 NOTE — Telephone Encounter (Signed)
NP called to say she did an A1C on him today and it was 9.6 Wanted to let Dr Pattricia Boss know.

## 2018-10-02 NOTE — Telephone Encounter (Signed)
Please enter a1c in quick abstraction.. Ask pt if he is tolerating metformin low dose.

## 2018-10-02 NOTE — Telephone Encounter (Signed)
I will refer him to dermatology.  I will send in an different medication for DM if he is agreeable.. such as semaglutide oral.

## 2018-10-02 NOTE — Telephone Encounter (Signed)
A1c abstracted as instructed by Dr. Ermalene Searing.   Spoke with Mr. Andreotti.  He states could not tolerate the Metformin so he is not taking it at all.  He states he still has red spots all over from the ringworm that is very itchy.  He is out of the cream.  Wants to know what he should do.  Please advise.

## 2018-10-03 MED ORDER — SEMAGLUTIDE 3 MG PO TABS: 3.0000 mg | ORAL_TABLET | Freq: Every day | ORAL | 1 refills | Status: DC

## 2018-10-03 MED ORDER — VENLAFAXINE HCL ER 75 MG PO CP24: ORAL_CAPSULE | ORAL | 1 refills | Status: DC

## 2018-10-03 MED ORDER — SEMAGLUTIDE 3 MG PO TABS: 3.0000 mg | ORAL_TABLET | Freq: Every day | ORAL | 0 refills | Status: DC

## 2018-10-03 MED ORDER — LOSARTAN POTASSIUM 50 MG PO TABS: 50.0000 mg | ORAL_TABLET | Freq: Every day | ORAL | 1 refills | Status: DC

## 2018-10-03 MED ORDER — VENLAFAXINE HCL ER 150 MG PO CP24: 150.0000 mg | ORAL_CAPSULE | Freq: Every day | ORAL | 1 refills | Status: DC

## 2018-10-03 NOTE — Telephone Encounter (Signed)
[name] notified as instructed by telephone.  He is agreeable to trying different diabetic medication.

## 2018-10-03 NOTE — Telephone Encounter (Signed)
[name] notified as instructed by telephone. Patient states understanding.  ?

## 2018-10-03 NOTE — Addendum Note (Signed)
Addended by: Damita Lack on: 10/03/2018 04:29 PM   Modules accepted: Orders

## 2018-10-03 NOTE — Telephone Encounter (Signed)
Pt returning your call

## 2018-10-03 NOTE — Telephone Encounter (Signed)
Left message for [name] to return my call.

## 2018-10-03 NOTE — Addendum Note (Signed)
Addended by: Kerby Nora E on: 10/03/2018 02:54 PM   Modules accepted: Orders

## 2018-10-03 NOTE — Telephone Encounter (Signed)
Call Start semaglutide daily low dose.. if tolerated have pt call in 30 days to increase to effective dose 7 mg.

## 2018-10-03 NOTE — Telephone Encounter (Signed)
Spoke with Mr. Kubala.  He states the semaglutide was going to cost him over $100 at [facility] and is asking if we can send the prescription to his mail order pharmacy instead.  He also ask that we send in refills for his venlafaxine and losartan to them as well.  Refills sent as requested to [facility].

## 2018-10-18 ENCOUNTER — Ambulatory Visit (INDEPENDENT_AMBULATORY_CARE_PROVIDER_SITE_OTHER): Payer: Medicare Other | Admitting: Family Medicine

## 2018-10-18 ENCOUNTER — Encounter: Payer: Self-pay | Admitting: Family Medicine

## 2018-10-18 VITALS — BP 128/68 | HR 81 | Temp 98.2°F | Ht 66.5 in | Wt 277.2 lb

## 2018-10-18 DIAGNOSIS — E1365 Other specified diabetes mellitus with hyperglycemia: Secondary | ICD-10-CM

## 2018-10-18 DIAGNOSIS — E1339 Other specified diabetes mellitus with other diabetic ophthalmic complication: Secondary | ICD-10-CM

## 2018-10-18 DIAGNOSIS — IMO0002 Reserved for concepts with insufficient information to code with codable children: Secondary | ICD-10-CM

## 2018-10-18 DIAGNOSIS — R197 Diarrhea, unspecified: Secondary | ICD-10-CM | POA: Diagnosis not present

## 2018-10-18 LAB — COMPREHENSIVE METABOLIC PANEL
ALT: 19 U/L (ref 0–53)
AST: 16 U/L (ref 0–37)
Albumin: 3.7 g/dL (ref 3.5–5.2)
Alkaline Phosphatase: 105 U/L (ref 39–117)
BUN: 15 mg/dL (ref 6–23)
CO2: 29 mEq/L (ref 19–32)
Calcium: 8.7 mg/dL (ref 8.4–10.5)
Chloride: 103 mEq/L (ref 96–112)
Creatinine, Ser: 1.06 mg/dL (ref 0.40–1.50)
GFR: 69.28 mL/min (ref >60.00)
Glucose, Bld: 197 mg/dL — ABNORMAL HIGH (ref 70–99)
Potassium: 3.5 mEq/L (ref 3.5–5.1)
Sodium: 139 mEq/L (ref 135–145)
Total Bilirubin: 0.4 mg/dL (ref 0.2–1.2)
Total Protein: 7 g/dL (ref 6.0–8.3)

## 2018-10-18 LAB — CBC WITH DIFFERENTIAL/PLATELET
Basophils Absolute: 0 10*3/uL (ref 0.0–0.1)
Basophils Relative: 0.3 % (ref 0.0–3.0)
Eosinophils Absolute: 0.1 10*3/uL (ref 0.0–0.7)
Eosinophils Relative: 1.9 % (ref 0.0–5.0)
HCT: 39.8 % (ref 39.0–52.0)
Hemoglobin: 13.5 g/dL (ref 13.0–17.0)
Lymphocytes Relative: 18.8 % (ref 12.0–46.0)
Lymphs Abs: 1.4 10*3/uL (ref 0.7–4.0)
MCHC: 33.9 g/dL (ref 30.0–36.0)
MCV: 88.1 fl (ref 78.0–100.0)
Monocytes Absolute: 1 10*3/uL (ref 0.1–1.0)
Monocytes Relative: 13.1 % — ABNORMAL HIGH (ref 3.0–12.0)
Neutro Abs: 4.8 10*3/uL (ref 1.4–7.7)
Neutrophils Relative %: 65.9 % (ref 43.0–77.0)
Platelets: 218 10*3/uL (ref 150.0–400.0)
RBC: 4.52 Mil/uL (ref 4.22–5.81)
RDW: 15.6 % — ABNORMAL HIGH (ref 11.5–15.5)
WBC: 7.3 10*3/uL (ref 4.0–10.5)

## 2018-10-18 NOTE — Assessment & Plan Note (Signed)
Decrease insulin if glucose become low with decreased intake.

## 2018-10-18 NOTE — Addendum Note (Signed)
Addended by: Damita Lack on: 10/18/2018 02:00 PM   Modules accepted: Orders

## 2018-10-18 NOTE — Progress Notes (Addendum)
   Subjective:    Patient ID: [name], male    DOB: March 16, 1951, 68 y.o.   MRN: [id number]  HPI    68 year old male presents with new onset diarrhea.  He reports it started on 2/16.  He has  Watery stools.. happens after eating... 12 times a day. Low abd pain started 2 days ago.He feels very tired and weak. No fever, no emesis, no blood in stool.  Stool started brown  But now dark black, has and odor. Did take small pepto bismol.  Afraid to eat because it will cause BM. No sick exposures.  The day before onset had spaghetti at church.  He works at a [employer]... touches raw eggs and chicken stool.   no travel, no well or creek water.  no recent antibitoics  FBS 2 days ago 152. Felt low but did not check it this AM.  Social History /Family History/Past Medical History reviewed in detail and updated in EMR if needed. Blood pressure 128/68, pulse 81, temperature 98.2 F (36.8 C), temperature source Oral, height 5' 6.5" (1.689 m), weight 277 lb 4 oz (125.8 kg), SpO2 98 %.   NEGATIVE HEMEOCULT CARD  Review of Systems  Constitutional: Positive for fatigue. Negative for fever.  HENT: Negative for ear pain.   Eyes: Negative for pain.  Respiratory: Negative for cough and shortness of breath.   Cardiovascular: Negative for chest pain, palpitations and leg swelling.  Gastrointestinal: Positive for abdominal pain and diarrhea. Negative for anal bleeding.  Genitourinary: Negative for dysuria.  Musculoskeletal: Negative for arthralgias.  Neurological: Negative for syncope, light-headedness and headaches.  Psychiatric/Behavioral: Negative for dysphoric mood.       Objective:   Physical Exam Constitutional:      Appearance: He is well-developed. He is obese.  HENT:     Head: Normocephalic.     Right Ear: Hearing normal.     Left Ear: Hearing normal.     Nose: Nose normal.  Neck:     Thyroid: No thyroid mass or thyromegaly.     Vascular: No carotid bruit.     Trachea:  Trachea normal.  Cardiovascular:     Rate and Rhythm: Normal rate and regular rhythm.     Pulses: Normal pulses.     Heart sounds: Heart sounds not distant. No murmur. No friction rub. No gallop.      Comments: No peripheral edema Pulmonary:     Effort: Pulmonary effort is normal. No respiratory distress.     Breath sounds: Normal breath sounds.  Abdominal:     Tenderness: There is abdominal tenderness in the right lower quadrant and left lower quadrant.     Comments:  Very mild soreness in bilateral lower quadrant  Skin:    General: Skin is warm and dry.     Findings: No rash.  Psychiatric:        Speech: Speech normal.        Behavior: Behavior normal.        Thought Content: Thought content normal.           Assessment & Plan:

## 2018-10-18 NOTE — Patient Instructions (Addendum)
Hold lasix until diarrhea resolved.  Push fluids as much as tolerated.  Eat something!  At least chicken soup etc.  Stop pepto bismol for now.

## 2018-10-18 NOTE — Assessment & Plan Note (Signed)
Hold lasix as mild dehydration likely, increase fluids.  Eval with labs and GI panel.

## 2018-10-19 LAB — GASTROINTESTINAL PATHOGEN PANEL PCR
C. difficile Tox A/B, PCR: NOT DETECTED
Campylobacter, PCR: DETECTED — AB
Cryptosporidium, PCR: NOT DETECTED
E coli (ETEC) LT/ST PCR: NOT DETECTED
E coli (STEC) stx1/stx2, PCR: NOT DETECTED
E coli 0157, PCR: NOT DETECTED
Giardia lamblia, PCR: NOT DETECTED
Norovirus, PCR: NOT DETECTED
Rotavirus A, PCR: NOT DETECTED
Salmonella, PCR: NOT DETECTED
Shigella, PCR: NOT DETECTED

## 2018-10-23 DIAGNOSIS — H34813 Central retinal vein occlusion, bilateral, with macular edema: Secondary | ICD-10-CM | POA: Diagnosis not present

## 2018-10-23 DIAGNOSIS — H3563 Retinal hemorrhage, bilateral: Secondary | ICD-10-CM | POA: Diagnosis not present

## 2018-10-23 DIAGNOSIS — H3582 Retinal ischemia: Secondary | ICD-10-CM | POA: Diagnosis not present

## 2018-10-23 DIAGNOSIS — E113393 Type 2 diabetes mellitus with moderate nonproliferative diabetic retinopathy without macular edema, bilateral: Secondary | ICD-10-CM | POA: Diagnosis not present

## 2018-11-28 ENCOUNTER — Telehealth: Payer: Self-pay

## 2018-11-28 NOTE — Telephone Encounter (Signed)
LMOV for patient to call back.  Need to see if they have a smartphone and trial run Doxy with patient.

## 2018-11-29 ENCOUNTER — Other Ambulatory Visit: Payer: Self-pay

## 2018-11-29 ENCOUNTER — Telehealth (INDEPENDENT_AMBULATORY_CARE_PROVIDER_SITE_OTHER): Payer: HMO | Admitting: Cardiovascular Disease

## 2018-11-29 DIAGNOSIS — E1365 Other specified diabetes mellitus with hyperglycemia: Secondary | ICD-10-CM

## 2018-11-29 DIAGNOSIS — E1339 Other specified diabetes mellitus with other diabetic ophthalmic complication: Secondary | ICD-10-CM | POA: Diagnosis not present

## 2018-11-29 DIAGNOSIS — E785 Hyperlipidemia, unspecified: Secondary | ICD-10-CM | POA: Diagnosis not present

## 2018-11-29 DIAGNOSIS — I119 Hypertensive heart disease without heart failure: Secondary | ICD-10-CM

## 2018-11-29 DIAGNOSIS — I25118 Atherosclerotic heart disease of native coronary artery with other forms of angina pectoris: Secondary | ICD-10-CM

## 2018-11-29 DIAGNOSIS — I10 Essential (primary) hypertension: Secondary | ICD-10-CM

## 2018-11-29 DIAGNOSIS — IMO0002 Reserved for concepts with insufficient information to code with codable children: Secondary | ICD-10-CM

## 2018-11-29 NOTE — Progress Notes (Addendum)
Virtual Visit via Telephone Note   This visit type was conducted due to national recommendations for restrictions regarding the COVID-19 Pandemic (e.g. social distancing) in an effort to limit this patient's exposure and mitigate transmission in our community.  Due to her co-morbid illnesses, this patient is at least at moderate risk for complications without adequate follow up.  This format is felt to be most appropriate for this patient at this time.  The patient did not have access to video technology/had technical difficulties with video requiring transitioning to audio format only (telephone).  All issues noted in this document were discussed and addressed.  No physical exam could be performed with this format.  Please refer to the patient's chart for her  consent to telehealth for [facility].    Date:  11/29/2018   ID:  [name], DOB 10-27-1950, MRN [id number]  Patient Location:  [place] [address]   Provider location:   [facility], [place] office  PCP:  Jinny Sanders, MD  Cardiologist:  Patsy Baltimore  Chief Complaint: Fatigue, depression    History of Present Illness:    [name] is a 68 y.o. male who presents via audio/video conferencing for a telehealth visit today.   The patient does not symptoms concerning for COVID-19 infection (fever, chills, cough, or new SHORTNESS OF BREATH).   Patient has a past medical history of morbid obesity,  HTN,  DM,  CAD  NSTEMI in 11/2015 (PCI to LAD; was discharged on Plavix) and again 02/2016 (no PCI; was discharged on Brilinta), Again presented to [facility] ER on 04/05/16 for NSTEMI (no PCI),  catheterization 04/06/2016, stable LAD stent, occluded RCA with collaterals Chronic total occlusion of RCA EF 55 to 60% Chronic chest pain syndrome GERD Who presents for follow-up of his coronary artery disease  Wife died 2 years ago Lives on his own Does his own chores  Tired all the time  With walking , gets tired Watches TV a lot  Depression still an issue Weight continues to run high,  dietary noncompliance ,  Previously worked part time chicken farm, 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  Widely patent proximal to mid LAD stent previously placed in July. There is first diagonal diagonal and LAD 30 and 50% narrowing respectively.  Widely patent circumflex unchanged from previous with 70% narrowing in a small branch of the first marginal. Circumflex collateralizes the distal right coronary left ventricular branch.  Inferobasal hypokinesis. EF 50%. EDP is normal.  Unable to identify a significant change in the angiographic appearance since prior study in July.   Past Medical History:  Diagnosis Date  . Back injury 02/2002   worker's comp  . CHF (congestive heart failure) ([place])   . Coronary artery disease, non-occlusive    a. cath 2002 with no sig CAD;  b. cath 2008 normal LM, LAD, LCx, p&dRCA 20-30%, PDA 30%; c.11/2015 NSTEMI/PCI: LM nl, LAD 95p (2.5x15 Xience DES), LCX nl, RCA 100p/m w/ L->R collats, EF 55-65% c. NSTEMI (02/2016)  with no culprit leision, switched to Brilinta.  d. NSTEMI 03/2016: again, no culprit lesion and switched back to plavix 2/2 SOB with Brilnta.    . Depression   . Diabetes mellitus type 2, insulin dependent ([place])   . Hyperlipemia   . Hypertensive heart disease   . Kidney stones   . Morbid obesity ([place])   . Osteoarthritis   . Snoring    Past Surgical History:  Procedure Laterality Date  . CARDIAC  CATHETERIZATION  09/2000   diffuse LAD 30% LCA  EF 50-60%  . CARDIAC CATHETERIZATION  06/2007   no significant CAD  . CARDIAC CATHETERIZATION N/A 12/25/2015   Procedure: Left Heart Cath and Coronary Angiography;  Surgeon: Minna Merritts, MD;  Location: [place] [facility];  Service: Cardiovascular;  Laterality: N/A;  . CARDIAC CATHETERIZATION N/A 12/25/2015   Procedure: Coronary Stent Intervention;  Surgeon: Yolonda Kida, MD;  Location: [place] [facility];  Service: Cardiovascular;  Laterality: N/A;  . CARDIAC CATHETERIZATION N/A 03/10/2016   Procedure: Left Heart Cath and Coronary Angiography;  Surgeon: Wellington Hampshire, MD;  Location: [place] [facility];  Service: Cardiovascular;  Laterality: N/A;  . CARDIAC CATHETERIZATION N/A 04/06/2016   Procedure: Left Heart Cath and Coronary Angiography;  Surgeon: Belva Crome, MD;  Location: [place] [facility];  Service: Cardiovascular;  Laterality: N/A;  . CIRCUMCISION       No outpatient medications have been marked as taking for the 11/29/18 encounter (Appointment) with Minna Merritts, MD.     Allergies:   Atorvastatin   Social History   Tobacco Use  . Smoking status: Never Smoker  . Smokeless tobacco: Never Used  Substance Use Topics  . Alcohol use: No    Alcohol/week: 0.0 standard drinks  . Drug use: No     Current Outpatient Medications on File Prior to Visit  Medication Sig Dispense Refill  . aspirin 81 MG EC tablet Take 1 tablet (81 mg total) by mouth daily.    . Blood Glucose Monitoring Suppl (ACCU-CHEK GUIDE) w/Device KIT     . carvedilol (COREG) 6.25 MG tablet Take 1 tablet (6.25 mg total) by mouth daily. 90 tablet 3  . clopidogrel (PLAVIX) 75 MG tablet TAKE 1 TABLET BY MOUTH ONCE DAILY WITH BREAKFAST 90 tablet 3  . ezetimibe (ZETIA) 10 MG tablet Take 1 tablet (10 mg total) by mouth daily. 90 tablet 3  . furosemide (LASIX) 40 MG tablet Take 1 tablet (40 mg total) by mouth 2 (two) times daily. 180 tablet 3  . insulin  NPH-regular Human (NOVOLIN 70/30) (70-30) 100 UNIT/ML injection Inject 70 units in the morning and 40 units at bedtime.    . isosorbide mononitrate (IMDUR) 30 MG 24 hr tablet Take 1 tablet (30 mg total) by mouth 2 (two) times daily. 180 tablet 3  . losartan (COZAAR) 50 MG tablet Take 1 tablet (50 mg total) by mouth daily. 90 tablet 1  . nitroGLYCERIN (NITROSTAT) 0.4 MG SL tablet DISSOLVE 1 TABLET UNDER TONGUE AS NEEDEDFOR CHEST PAIN. MAY REPEAT 5 MINUTES APART 3 TIMES IF NEEDED 25 tablet 3  . ONE TOUCH ULTRA TEST test strip USE ONE STRIP TO CHECK GLUCOSE TWICE DAILY 100 each 5  . potassium chloride SA (K-DUR,KLOR-CON) 20 MEQ tablet TAKE 1 TABLET BY MOUTH ONCE DAILY 90 tablet 1  . PRESCRIPTION MEDICATION Eye drops - administer day before and day after eye injections or laser treatments - next office visit mid December 2017    .  ranitidine (ZANTAC) 150 MG tablet Take 1 tablet (150 mg total) by mouth 2 (two) times daily. 180 tablet 3  . rosuvastatin (CRESTOR) 40 MG tablet Take 1 tablet (40 mg total) by mouth daily. 90 tablet 3  . triamcinolone cream (KENALOG) 0.5 % Apply topically twice daily to arms 30 g 1  . venlafaxine XR (EFFEXOR-XR) 150 MG 24 hr capsule Take 1 capsule (150 mg total) by mouth daily with breakfast. 90 capsule 1  . venlafaxine XR (EFFEXOR-XR) 75 MG 24 hr capsule Take an additional 75 mg  In addition to the 150 mg XL already taking to get to a d ose of 225 mg daily. 90 capsule 1   No current facility-administered medications on file prior to visit.      Family Hx: [name] family history includes Alzheimer's disease in his mother; Cancer in his brother; Diabetes in his father; Emphysema in his mother; Heart disease in his father.  ROS:   Please see the history of present illness.    Review of Systems  Constitutional: Negative.   Respiratory: Negative.   Cardiovascular: Negative.   Gastrointestinal: Positive for heartburn.  Musculoskeletal: Negative.   Neurological:  Negative.   Psychiatric/Behavioral: Negative.   All other systems reviewed and are negative.     Labs/Other Tests and Data Reviewed:    Recent Labs: 10/18/2018: ALT 19; BUN 15; Creatinine, Ser 1.06; Hemoglobin 13.5; Platelets 218.0; Potassium 3.5; Sodium 139   Recent Lipid Panel Lab Results  Component Value Date/Time   CHOL 90 06/22/2018 08:50 AM   TRIG 102.0 06/22/2018 08:50 AM   HDL 30.80 (L) 06/22/2018 08:50 AM   CHOLHDL 3 06/22/2018 08:50 AM   LDLCALC 39 06/22/2018 08:50 AM   LDLDIRECT 174.0 10/19/2017 04:00 PM    Wt Readings from Last 3 Encounters:  10/18/18 277 lb 4 oz (125.8 kg)  09/11/18 279 lb 8 oz (126.8 kg)  06/26/18 280 lb 8 oz (127.2 kg)     Exam:    Vital Signs:  120s over 70, pulse 80, respirations 16  Well nourished, well developed male in no acute distress.    ASSESSMENT & PLAN:    Coronary artery disease of native artery of native heart with stable angina pectoris (HCC) Currently with no symptoms of angina. No further workup at this time. Continue current medication regimen.Stable  Morbid obesity ([place]) We have encouraged continued exercise, careful diet management in an effort to lose weight.  Very deconditioned  Hyperlipidemia LDL goal <70 Cholesterol is at goal on the current lipid regimen. No changes to the medications were made.  Essential hypertension, benign Blood pressure is well controlled on today's visit. No changes made to the medications. Stable  DM (diabetes mellitus), secondary, uncontrolled, w/eye complications (HCC) Hemoglobin A1c markedly elevated greater than 9 Discussed his diet with him  he drinks diet soda Recommended low carbohydrate meals and more walking  GERD: Stable, takes PPI, sometimes H2 blocker Tries to avoid certain foods  JSEGB-15 Education: The signs and symptoms of COVID-19 were discussed with the patient and how to seek care for testing (follow up with PCP or arrange E-visit).  The importance of social  distancing was discussed today.  Patient Risk:   After full review of this patients clinical status, I feel that they are at least moderate risk at this time.  Time:   Today, I have spent 25 minutes with the patient with telehealth technology discussing GERD symptoms, anginal symptoms, diet changes needed for weight loss and conditioning.  Recommended walking program.     Medication Adjustments/Labs and Tests Ordered: Current medicines are reviewed at length with the patient today.  Concerns regarding medicines are outlined above.   Tests Ordered: No tests ordered   Medication Changes: No changes made   Disposition: Follow-up in 6 months   Signed, Ida Rogue, MD  11/29/2018 3:21 PM    [place] Office [address] [place] [address], [place], [place] [address]

## 2018-11-29 NOTE — Patient Instructions (Signed)

## 2018-12-16 ENCOUNTER — Other Ambulatory Visit: Payer: Self-pay | Admitting: Cardiovascular Disease

## 2018-12-26 ENCOUNTER — Telehealth: Payer: Self-pay | Admitting: Family Medicine

## 2018-12-26 NOTE — Telephone Encounter (Signed)
Copied from CRM [id number]. Topic: Quick Communication - See Telephone Encounter >> Dec 26, 2018  4:06 PM Angela Nevin wrote: CRM for notification. See Telephone encounter for: 12/26/18.   Patient requesting all medications on file be sent to new pharmacy due to insurance change.    Pharmacy (added to chart) : [facility] [address], [place] - [address]  [phone] (Phone) [phone] (Fax)

## 2018-12-26 NOTE — Telephone Encounter (Signed)
Forms were received today.  They have been completed and placed in Dr. Daphine Deutscher in box for signature.  She is not back in the office until Thursday.

## 2018-12-26 NOTE — Telephone Encounter (Signed)
Shanda Bumps with [employer] is calling in regards to the paperwork that was faxed to our office yesterday .  This was a form to state that he either has diabetes or congestive heart failure.   They are needing this filled out asap for the patient or for someone to call an verbally give them this information   PHONE- [phone] Member service line. They will give message to [place].

## 2018-12-27 MED ORDER — VENLAFAXINE HCL ER 150 MG PO CP24: 150.0000 mg | ORAL_CAPSULE | Freq: Every day | ORAL | 1 refills | Status: DC

## 2018-12-27 MED ORDER — POTASSIUM CHLORIDE CRYS ER 20 MEQ PO TBCR: 20.0000 meq | EXTENDED_RELEASE_TABLET | Freq: Every day | ORAL | 1 refills | Status: DC

## 2018-12-27 MED ORDER — LOSARTAN POTASSIUM 50 MG PO TABS: 50.0000 mg | ORAL_TABLET | Freq: Every day | ORAL | 1 refills | Status: DC

## 2018-12-27 MED ORDER — VENLAFAXINE HCL ER 75 MG PO CP24: 75.0000 mg | ORAL_CAPSULE | Freq: Every day | ORAL | 1 refills | Status: DC

## 2018-12-27 MED ORDER — FUROSEMIDE 40 MG PO TABS: 40.0000 mg | ORAL_TABLET | Freq: Two times a day (BID) | ORAL | 1 refills | Status: DC

## 2018-12-27 MED ORDER — CLOPIDOGREL BISULFATE 75 MG PO TABS: ORAL_TABLET | ORAL | 1 refills | Status: DC

## 2018-12-27 MED ORDER — VENLAFAXINE HCL ER 75 MG PO CP24: ORAL_CAPSULE | ORAL | 1 refills | Status: DC

## 2018-12-27 NOTE — Addendum Note (Signed)
Addended by: Damita Lack on: 12/27/2018 12:17 PM   Modules accepted: Orders

## 2018-12-27 NOTE — Telephone Encounter (Signed)
Form faxed to [facility] [employer] at [phone].

## 2018-12-27 NOTE — Telephone Encounter (Signed)
Done and placed in Donna's box.  

## 2018-12-27 NOTE — Telephone Encounter (Signed)
I have sent refills to [facility] that Dr. Ermalene Searing normally prescribes for Mr. Ciszek.  The rest are prescribed by Dr. Mariah Milling.  Will forward to Dr. Mariah Milling for his approval.

## 2018-12-27 NOTE — Telephone Encounter (Signed)
We should have refilled his meds on recent evisit 11/2018

## 2018-12-28 MED ORDER — NITROGLYCERIN 0.4 MG SL SUBL: SUBLINGUAL_TABLET | SUBLINGUAL | 3 refills | Status: DC

## 2018-12-28 MED ORDER — ISOSORBIDE MONONITRATE ER 30 MG PO TB24: 30.0000 mg | ORAL_TABLET | Freq: Two times a day (BID) | ORAL | 3 refills | Status: DC

## 2018-12-28 MED ORDER — EZETIMIBE 10 MG PO TABS: 10.0000 mg | ORAL_TABLET | Freq: Every day | ORAL | 3 refills | Status: DC

## 2018-12-28 MED ORDER — ROSUVASTATIN CALCIUM 40 MG PO TABS: 40.0000 mg | ORAL_TABLET | Freq: Every day | ORAL | 3 refills | Status: DC

## 2018-12-28 NOTE — Telephone Encounter (Signed)
Carvedilol can be increased to 6.25 twice daily Reviewed blood pressure heart rates, should be able to tolerate it fine

## 2018-12-28 NOTE — Telephone Encounter (Signed)
Dr. Mariah Milling, it looks like he has been taking coreg 6.25 mg once daily since summer of 2017, but I don't see where anyone advised him to do this- just wanted to make sure you were aware before I refilled.   Thanks!  Other cardiac drugs not refilled by Dr. Ermalene Searing were sent to the patient's new pharmacy as requested.

## 2019-01-01 ENCOUNTER — Ambulatory Visit (INDEPENDENT_AMBULATORY_CARE_PROVIDER_SITE_OTHER): Payer: HMO | Admitting: Family Medicine

## 2019-01-01 ENCOUNTER — Encounter: Payer: Self-pay | Admitting: Family Medicine

## 2019-01-01 VITALS — Ht 66.5 in | Wt 280.0 lb

## 2019-01-01 DIAGNOSIS — IMO0002 Reserved for concepts with insufficient information to code with codable children: Secondary | ICD-10-CM

## 2019-01-01 DIAGNOSIS — E1339 Other specified diabetes mellitus with other diabetic ophthalmic complication: Secondary | ICD-10-CM

## 2019-01-01 DIAGNOSIS — E1365 Other specified diabetes mellitus with hyperglycemia: Secondary | ICD-10-CM | POA: Diagnosis not present

## 2019-01-01 MED ORDER — DULAGLUTIDE 0.75 MG/0.5ML ~~LOC~~ SOAJ: 0.5000 mL | SUBCUTANEOUS | 11 refills | Status: DC

## 2019-01-01 MED ORDER — INSULIN NPH ISOPHANE & REGULAR (70-30) 100 UNIT/ML ~~LOC~~ SUSP: SUBCUTANEOUS | 5 refills | Status: DC

## 2019-01-01 MED ORDER — MISC. DEVICES MISC: 11 refills | Status: DC

## 2019-01-01 MED ORDER — MISC. DEVICES KIT: PACK | 0 refills | Status: DC

## 2019-01-01 NOTE — Patient Instructions (Signed)
Continue current &0/30 dose.  Add trulicty once weekly.  Follow blood sugars daily fasting and occ 2 hour after meals.

## 2019-01-01 NOTE — Assessment & Plan Note (Signed)
Continue insulin 70/30. Add trulicity weekly as now covered.  Work on low [employer].  follow up in 2 weeks to reassess med SE and effect. Pt will follow CBGs.. sent in rx for meter and test strips.

## 2019-01-01 NOTE — Progress Notes (Signed)
VIRTUAL VISIT Due to national recommendations of social distancing due to [place] 19, a virtual visit is felt to be most appropriate for this patient at this time.   Interactive audio and video telecommunications were attempted between this provider and patient, however failed, due to patient having technical difficulties OR patient did not have access to video capability.  We continued and completed visit with audio only.    I connected with the patient on 01/01/19 at  2:50 PM EDT by virtual telehealth platform and verified that I am speaking with the correct person using two identifiers.   I discussed the limitations, risks, security and privacy concerns of performing an evaluation and management service by  virtual telehealth platform and the availability of in person appointments. I also discussed with the patient that there may be a patient responsible charge related to this service. The patient expressed understanding and agreed to proceed.  Patient location: Home Provider Location: [facility] Participants: [name] and [name]   Chief Complaint  Patient presents with  . Diabetes    Discuss changing insulin     History of Present Illness: 68 year old morbidly obese  male with poorly controlled DM, retinopathy due to diabetes, CAD,and HTN presents for discussion of changing diabetes medication. [facility].  Diabetes: he is currently poorly controlled on Novolin 70/30 70 units in AM and 40 units at night Lab Results  Component Value Date   HGBA1C 9.6 10/02/2018  In past cost is one of his biggest issue.. could not afford Victoza. Using medications without difficulties: Hypoglycemic episodes:none Hyperglycemic episodes:occ Feet problems:no ulcers Blood Sugars averaging: FBS 120-150, 2 hour post prandial: not checking eye exam within last year: 01/2018   Wt Readings from Last 3 Encounters:  01/01/19 280 lb (127 kg)   10/18/18 277 lb 4 oz (125.8 kg)  09/11/18 279 lb 8 oz (126.8 kg)     COVID 19 screen No recent travel or known exposure to [place] The patient denies respiratory symptoms of COVID 19 at this time.  The importance of social distancing was discussed today.   Review of Systems  Constitutional: Negative for chills and fever.  HENT: Negative for congestion and ear pain.   Eyes: Negative for pain and redness.  Respiratory: Negative for cough and shortness of breath.   Cardiovascular: Negative for chest pain, palpitations and leg swelling.  Gastrointestinal: Negative for abdominal pain, blood in stool, constipation, diarrhea, nausea and vomiting.  Genitourinary: Negative for dysuria.  Musculoskeletal: Negative for falls and myalgias.  Skin: Negative for rash.  Neurological: Negative for dizziness.  Psychiatric/Behavioral: Negative for depression. The patient is not nervous/anxious.       Past Medical History:  Diagnosis Date  . Back injury 02/2002   worker's comp  . CHF (congestive heart failure) ([place])   . Coronary artery disease, non-occlusive    a. cath 2002 with no sig CAD;  b. cath 2008 normal LM, LAD, LCx, p&dRCA 20-30%, PDA 30%; c.11/2015 NSTEMI/PCI: LM nl, LAD 95p (2.5x15 Xience DES), LCX nl, RCA 100p/m w/ L->R collats, EF 55-65% c. NSTEMI (02/2016) with no culprit leision, switched to Brilinta.  d. NSTEMI 03/2016: again, no culprit lesion and switched back to plavix 2/2 SOB with Brilnta.    . Depression   . Diabetes mellitus type 2, insulin dependent ([place])   . Hyperlipemia   . Hypertensive heart disease   . Kidney stones   . Morbid obesity (  [place])   . Osteoarthritis   . Snoring     reports that he has never smoked. He has never used smokeless tobacco. He reports that he does not drink alcohol or use drugs.   Current Outpatient Medications:  .  aspirin 81 MG EC tablet, Take 1 tablet (81 mg total) by mouth daily., Disp: , Rfl:  .  Blood Glucose Monitoring Suppl (ACCU-CHEK GUIDE)  w/Device KIT, , Disp: , Rfl:  .  carvedilol (COREG) 6.25 MG tablet, TAKE 1 TABLET BY MOUTH  DAILY, Disp: 90 tablet, Rfl: 1 .  clopidogrel (PLAVIX) 75 MG tablet, TAKE 1 TABLET BY MOUTH ONCE DAILY WITH BREAKFAST, Disp: 90 tablet, Rfl: 1 .  ezetimibe (ZETIA) 10 MG tablet, Take 1 tablet (10 mg total) by mouth daily., Disp: 90 tablet, Rfl: 3 .  furosemide (LASIX) 40 MG tablet, Take 1 tablet (40 mg total) by mouth 2 (two) times daily., Disp: 180 tablet, Rfl: 1 .  insulin NPH-regular Human (NOVOLIN 70/30) (70-30) 100 UNIT/ML injection, Inject 70 units in the morning and 40 units at bedtime., Disp: , Rfl:  .  isosorbide mononitrate (IMDUR) 30 MG 24 hr tablet, Take 1 tablet (30 mg total) by mouth 2 (two) times daily., Disp: 180 tablet, Rfl: 3 .  losartan (COZAAR) 50 MG tablet, Take 1 tablet (50 mg total) by mouth daily., Disp: 90 tablet, Rfl: 1 .  nitroGLYCERIN (NITROSTAT) 0.4 MG SL tablet, DISSOLVE 1 TABLET UNDER TONGUE AS NEEDEDFOR CHEST PAIN. MAY REPEAT 5 MINUTES APART 3 TIMES IF NEEDED, Disp: 25 tablet, Rfl: 3 .  ONE TOUCH ULTRA TEST test strip, USE ONE STRIP TO CHECK GLUCOSE TWICE DAILY, Disp: 100 each, Rfl: 5 .  potassium chloride SA (K-DUR) 20 MEQ tablet, Take 1 tablet (20 mEq total) by mouth daily., Disp: 90 tablet, Rfl: 1 .  PRESCRIPTION MEDICATION, Eye drops - administer day before and day after eye injections or laser treatments - next office visit mid December 2017, Disp: , Rfl:  .  ranitidine (ZANTAC) 150 MG tablet, Take 1 tablet (150 mg total) by mouth 2 (two) times daily., Disp: 180 tablet, Rfl: 3 .  rosuvastatin (CRESTOR) 40 MG tablet, Take 1 tablet (40 mg total) by mouth daily., Disp: 90 tablet, Rfl: 3 .  triamcinolone cream (KENALOG) 0.5 %, Apply topically twice daily to arms, Disp: 30 g, Rfl: 1 .  venlafaxine XR (EFFEXOR-XR) 150 MG 24 hr capsule, Take 1 capsule (150 mg total) by mouth daily with breakfast., Disp: 90 capsule, Rfl: 1 .  venlafaxine XR (EFFEXOR-XR) 75 MG 24 hr capsule, Take 1  capsule (75 mg total) by mouth daily. in addition to the 150 mg XL already taking to get to a total dose of 225 mg daily., Disp: 90 capsule, Rfl: 1   Observations/Objective: Height 5' 6.5" (1.689 m), weight 280 lb (127 kg).  Physical Exam  Physical Exam Constitutional:      General: The patient is not in acute distress. Pulmonary:     Effort: Pulmonary effort is normal. No respiratory distress.  Neurological:     Mental Status: The patient is alert and oriented to person, place, and time.  Psychiatric:        Mood and Affect: Mood normal.        Behavior: Behavior normal.   Assessment and Plan DM (diabetes mellitus), secondary, uncontrolled, w/eye complications (HCC) Continue insulin 70/30. Add trulicity weekly as now covered.  Work on low [employer].  follow up in 2 weeks to reassess med  SE and effect. Pt will follow CBGs.. sent in rx for meter and test strips.   Discussed the assessment and treatment plan with the patient. The patient was provided an opportunity to ask questions and all were answered. The patient agreed with the plan and demonstrated an understanding of the instructions.   The patient was advised to call back or seek an in-person evaluation if the symptoms worsen or if the condition fails to improve as anticipated.     [name], MD

## 2019-01-02 NOTE — Progress Notes (Signed)
Left message asking pt to call office  °

## 2019-01-03 ENCOUNTER — Other Ambulatory Visit: Payer: Self-pay | Admitting: Family Medicine

## 2019-01-03 MED ORDER — FUROSEMIDE 40 MG PO TABS: 40.0000 mg | ORAL_TABLET | Freq: Two times a day (BID) | ORAL | 1 refills | Status: DC

## 2019-01-03 MED ORDER — CLOPIDOGREL BISULFATE 75 MG PO TABS: ORAL_TABLET | ORAL | 1 refills | Status: DC

## 2019-01-03 MED ORDER — VENLAFAXINE HCL ER 150 MG PO CP24: 150.0000 mg | ORAL_CAPSULE | Freq: Every day | ORAL | 1 refills | Status: DC

## 2019-01-03 MED ORDER — LOSARTAN POTASSIUM 50 MG PO TABS: 50.0000 mg | ORAL_TABLET | Freq: Every day | ORAL | 1 refills | Status: DC

## 2019-01-03 MED ORDER — VENLAFAXINE HCL ER 75 MG PO CP24: 75.0000 mg | ORAL_CAPSULE | Freq: Every day | ORAL | 1 refills | Status: DC

## 2019-01-03 MED ORDER — POTASSIUM CHLORIDE CRYS ER 20 MEQ PO TBCR: 20.0000 meq | EXTENDED_RELEASE_TABLET | Freq: Every day | ORAL | 1 refills | Status: DC

## 2019-01-03 NOTE — Telephone Encounter (Signed)
Spoke with pt his insurance has changed and he needs to get this [facility]   Pt stated he needs all his meds sent there   He didn't have the names of the rx's   Best number [phone]

## 2019-01-03 NOTE — Telephone Encounter (Signed)
I have sent refills for the medications that Dr. Ermalene Searing normally refills.  The rest are from Dr. Mariah Milling.  Will forward to him to approve other medications.

## 2019-01-03 NOTE — Progress Notes (Signed)
Appointment 5/19

## 2019-01-04 ENCOUNTER — Other Ambulatory Visit: Payer: Self-pay | Admitting: Cardiovascular Disease

## 2019-01-04 ENCOUNTER — Other Ambulatory Visit: Payer: Self-pay | Admitting: Family Medicine

## 2019-01-07 NOTE — Telephone Encounter (Signed)
Normally this medication is refilled by Dr. Mariah Milling.  Refill was sent to him but he has not authorized refill yet.  I will ask Dr. Ermalene Searing if she is okay refill his Carvedilol?

## 2019-01-07 NOTE — Telephone Encounter (Signed)
Chris left v/m requesting status of carvedilol refill to [facility].

## 2019-01-08 NOTE — Telephone Encounter (Signed)
Okay to refill... for some reason it wioll not let me put in a pharmacy... can you please refill #90  1 refill

## 2019-01-15 ENCOUNTER — Encounter: Payer: Self-pay | Admitting: Family Medicine

## 2019-01-15 ENCOUNTER — Ambulatory Visit (INDEPENDENT_AMBULATORY_CARE_PROVIDER_SITE_OTHER): Payer: HMO | Admitting: Family Medicine

## 2019-01-15 ENCOUNTER — Ambulatory Visit: Payer: HMO | Admitting: Family Medicine

## 2019-01-15 DIAGNOSIS — E1365 Other specified diabetes mellitus with hyperglycemia: Secondary | ICD-10-CM | POA: Diagnosis not present

## 2019-01-15 DIAGNOSIS — E1339 Other specified diabetes mellitus with other diabetic ophthalmic complication: Secondary | ICD-10-CM

## 2019-01-15 DIAGNOSIS — IMO0002 Reserved for concepts with insufficient information to code with codable children: Secondary | ICD-10-CM

## 2019-01-15 NOTE — Assessment & Plan Note (Signed)
Improved control of blood sugar but no weight loss yet.  Continue current dose.. but will likely increase to max dose at next OV  If A1C not at goal in 3 months

## 2019-01-15 NOTE — Progress Notes (Signed)
VIRTUAL VISIT Due to national recommendations of social distancing due to [place] 19, a virtual visit is felt to be most appropriate for this patient at this time.   I connected with the patient on 01/15/19 at  3:40 PM EDT by virtual telehealth platform and verified that I am speaking with the correct person using two identifiers.   I discussed the limitations, risks, security and privacy concerns of performing an evaluation and management service by  virtual telehealth platform and the availability of in person appointments. I also discussed with the patient that there may be a patient responsible charge related to this service. The patient expressed understanding and agreed to proceed.  Patient location: Home Provider Location: [facility] Participants: [name] and [name]   Chief Complaint  Patient presents with  . Follow-up    Trulicity start    History of Present Illness: 68 year old male presents for follow up DM, following Trulicity start on 08/29/313. FOr 2 weeks. He reports no side effects... feels more hungry. NO N/V/abd pain.  He is stuck at home so nibbling.   FBS: 105-130, not checking after meals. Occ 90 in middle of night.. has PB sandwich.. 2-3 nights a week.  No 60s or below.  Wt Readings from Last 3 Encounters:  01/15/19 282 lb (127.9 kg)  01/01/19 280 lb (127 kg)  10/18/18 277 lb 4 oz (125.8 kg)    Going back to work tommorow.  COVID 19 screen No recent travel or known exposure to COVID19 The patient denies respiratory symptoms of COVID 19 at this time.  The importance of social distancing was discussed today.   Review of Systems  Constitutional: Positive for malaise/fatigue. Negative for chills and fever.  HENT: Negative for congestion and ear pain.   Eyes: Negative for pain and redness.  Respiratory: Negative for cough and shortness of breath.   Cardiovascular: Negative for chest pain, palpitations and leg swelling.  Gastrointestinal:  Negative for abdominal pain, blood in stool, constipation, diarrhea, nausea and vomiting.  Genitourinary: Negative for dysuria.  Musculoskeletal: Negative for falls and myalgias.  Skin: Negative for rash.  Neurological: Negative for dizziness.  Psychiatric/Behavioral: Negative for depression. The patient is not nervous/anxious.       Past Medical History:  Diagnosis Date  . Back injury 02/2002   worker's comp  . CHF (congestive heart failure) ([place])   . Coronary artery disease, non-occlusive    a. cath 2002 with no sig CAD;  b. cath 2008 normal LM, LAD, LCx, p&dRCA 20-30%, PDA 30%; c.11/2015 NSTEMI/PCI: LM nl, LAD 95p (2.5x15 Xience DES), LCX nl, RCA 100p/m w/ L->R collats, EF 55-65% c. NSTEMI (02/2016) with no culprit leision, switched to Brilinta.  d. NSTEMI 03/2016: again, no culprit lesion and switched back to plavix 2/2 SOB with Brilnta.    . Depression   . Diabetes mellitus type 2, insulin dependent ([place])   . Hyperlipemia   . Hypertensive heart disease   . Kidney stones   . Morbid obesity ([place])   . Osteoarthritis   . Snoring     reports that he has never smoked. He has never used smokeless tobacco. He reports that he does not drink alcohol or use drugs.   Current Outpatient Medications:  .  aspirin 81 MG EC tablet, Take 1 tablet (81 mg total) by mouth daily., Disp: , Rfl:  .  Blood Glucose Monitoring Suppl (ACCU-CHEK GUIDE) w/Device KIT, , Disp: , Rfl:  .  carvedilol (COREG) 6.25  MG tablet, Take 1 tablet (6.25 mg total) by mouth daily., Disp: 90 tablet, Rfl: 1 .  clopidogrel (PLAVIX) 75 MG tablet, TAKE 1 TABLET BY MOUTH ONCE DAILY WITH BREAKFAST, Disp: 90 tablet, Rfl: 1 .  Dulaglutide (TRULICITY) 1.75 ZW/2.5EN SOPN, Inject 0.5 mLs into the skin once a week., Disp: 3 mL, Rfl: 11 .  ezetimibe (ZETIA) 10 MG tablet, TAKE 1 TABLET BY MOUTH ONCE DAILY, Disp: 90 tablet, Rfl: 3 .  furosemide (LASIX) 40 MG tablet, Take 1 tablet (40 mg total) by mouth 2 (two) times daily., Disp: 180 tablet,  Rfl: 1 .  insulin NPH-regular Human (NOVOLIN 70/30) (70-30) 100 UNIT/ML injection, Inject 70 units in the morning and 40 units at bedtime., Disp: 50 mL, Rfl: 5 .  isosorbide mononitrate (IMDUR) 30 MG 24 hr tablet, TAKE 1 TABLET BY MOUTH TWICE A DAY, Disp: 180 tablet, Rfl: 3 .  Lancets (ONETOUCH DELICA PLUS [id number]) MISC, , Disp: , Rfl:  .  losartan (COZAAR) 50 MG tablet, Take 1 tablet (50 mg total) by mouth daily., Disp: 90 tablet, Rfl: 1 .  Misc. Devices KIT, One touch ultra glucometer Check blood sugar 1-2 times daily  Dx 13.39, Disp: 1 each, Rfl: 0 .  Misc. Devices MISC, One touch Ultra Test Strips  Check blood sugar fasting and 2 hours after meals  Dx 13.39, Disp: 100 each, Rfl: 11 .  nitroGLYCERIN (NITROSTAT) 0.4 MG SL tablet, DISSOLVE 1 TABLET UNDER TONGUE AS NEEDEDFOR CHEST PAIN. MAY REPEAT 5 MINUTES APART 3 TIMES IF NEEDED, Disp: 25 tablet, Rfl: 3 .  ONE TOUCH ULTRA TEST test strip, USE ONE STRIP TO CHECK GLUCOSE TWICE DAILY, Disp: 100 each, Rfl: 5 .  potassium chloride SA (K-DUR) 20 MEQ tablet, Take 1 tablet (20 mEq total) by mouth daily., Disp: 90 tablet, Rfl: 1 .  PRESCRIPTION MEDICATION, Eye drops - administer day before and day after eye injections or laser treatments - next office visit mid December 2017, Disp: , Rfl:  .  rosuvastatin (CRESTOR) 40 MG tablet, Take 1 tablet (40 mg total) by mouth daily., Disp: 90 tablet, Rfl: 3 .  triamcinolone cream (KENALOG) 0.5 %, Apply topically twice daily to arms, Disp: 30 g, Rfl: 1 .  venlafaxine XR (EFFEXOR-XR) 150 MG 24 hr capsule, Take 1 capsule (150 mg total) by mouth daily with breakfast., Disp: 90 capsule, Rfl: 1 .  venlafaxine XR (EFFEXOR-XR) 75 MG 24 hr capsule, Take 1 capsule (75 mg total) by mouth daily. in addition to the 150 mg XL already taking to get to a total dose of 225 mg daily., Disp: 90 capsule, Rfl: 1   Observations/Objective: Height 5' 6.5" (1.689 m), weight 282 lb (127.9 kg).  Physical Exam  Physical  Exam Constitutional:      General: The patient is not in acute distress. Pulmonary:     Effort: Pulmonary effort is normal. No respiratory distress.  Neurological:     Mental Status: The patient is alert and oriented to person, place, and time.  Psychiatric:        Mood and Affect: Mood normal.        Behavior: Behavior normal.   Assessment and Plan DM (diabetes mellitus), secondary, uncontrolled, w/eye complications ([place]) Improved control of blood sugar but no weight loss yet.  Continue current dose.. but will likely increase to max dose at next OV  If A1C not at goal in 3 months       I discussed the assessment and treatment plan with the  patient. The patient was provided an opportunity to ask questions and all were answered. The patient agreed with the plan and demonstrated an understanding of the instructions.   The patient was advised to call back or seek an in-person evaluation if the symptoms worsen or if the condition fails to improve as anticipated.     William Lofts, MD

## 2019-01-17 NOTE — Progress Notes (Signed)
Pt has medicare wellness 7/14 Medicare cpx 7/21

## 2019-02-01 ENCOUNTER — Other Ambulatory Visit: Payer: Self-pay

## 2019-02-01 NOTE — Patient Outreach (Signed)
  [facility]) Care Management Chronic Special Needs Program    02/01/2019  Name: [name], DOB: Jun 06, 1951  MRN: [id number]   Mr. Champion Phelan is enrolled in a chronic special needs plan. RNCM called to review health risk assessment and complete individualized care plan. RNCM spoke with client. Two patient identifiers confirmed. Client states this is not a good time to talk and request a call back next week.  Plan: RNCM will make telephonic outreach next week.  Kathyrn Sheriff, RN, MSN, [facility] Chronic Care Management Coordinator [facility] [phone]

## 2019-02-05 ENCOUNTER — Other Ambulatory Visit: Payer: Self-pay

## 2019-02-05 NOTE — Patient Outreach (Signed)
  [facility]) Care Management Chronic Special Needs Program  02/05/2019  Name: [name] DOB: 1951-04-02  MRN: [id number]  Mr. Daud Cayer is enrolled in a chronic special needs plan for Heart Failure. Chronic Care Management Coordinator telephoned client to review health risk assessment and to develop individualized care plan.  Introduced the chronic care management program, importance of client participation, and taking their care plan to all provider appointments and inpatient facilities.    Subjective: Client reports history of diabetes, heart failure, heart attack. PHQ2=4 PHQ9=11. Client reports a history of depression. He states the medication helps. He talks about missing his wife since she passed away. 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 Client is receptive to social work referral. Client is also on greater than 8 medications and is receptive to pharmacy referral. Client did not know his A1C, but states that it will be checked at his next appointment. He reports he was just started on a different medication to help manage his diabetes. Client reports he weighs himself daily, but does not record his weights. Client states he feels he is more knowledgeable about diabetes than heart failure management.    Goals Addressed            This Visit's Progress   . Client understands the importance of follow-up with providers by attending scheduled visits      . Client verbalize knowledge of Heart Failure disease self management skills within 6 months.      . Client verbalizes knowledge of Heart Attack self management skills within the next 6 months.      . Client will report weighing and recording weights daily within 6 months.      . Client will verbalize knowledge of diabetes self-management as evidenced by Hgb A1C <7 or as defined by provider.       Diabetes self management actions:  Glucose  monitoring per provider recommendations  Perform Quality checks on blood meter  Eat Healthy  Check feet daily  Visit provider every 3-6 months as directed  Hbg A1C level every 3-6 months.  Eye Exam yearly    . Client will verbalize knowledge of self management of Hypertension as evidences by BP reading of 140/90 or less; or as defined by provider      . Maintain timely refills of Heart Failure medication as prescribed within the year       . Obtain annual  Lipid Profile, LDL-C      . Visit Primary Care Provider or Cardiologist at least 2 times per year        Covid 19 precautions discussed. RNCM reinforced the 24 hour nurse advice line. RNCM encouraged client to contact health care concierge for benefit questions. Client encouraged to call RNCM as needed/Confirmed he has contact number.  Plan:  Send successful outreach letter with a copy of their individualized care plan, Send individual care plan to provider and Send educational material. Send advanced directive packet per client request. Chronic care management coordination will outreach in: 6 months. Will refer client to:  Social Work and [place], RN, MSN, [place] [phone]

## 2019-02-06 ENCOUNTER — Telehealth: Payer: Self-pay | Admitting: Family Medicine

## 2019-02-06 ENCOUNTER — Other Ambulatory Visit: Payer: Self-pay

## 2019-02-06 NOTE — Patient Outreach (Signed)
  [facility]) Care Management Chronic Special Needs Program    02/06/2019  Name: [name], DOB: [birth year]  MRN: [id number]   [name] is enrolled in a chronic special needs plan.   Care Coordination: RNCM called primary care office. Left message regarding client's PHQ2/9 scores, [facility] social work referral regarding community resources. Also informed note routed to primary care on yesterday.  Plan: RNCM will continue to follow.  Thea Silversmith, RN, MSN, [place] [phone]

## 2019-02-06 NOTE — Telephone Encounter (Signed)
[facility], spoke with patient yesterday. He has PHQ2-4 and PHQ9-11.  Patient told her the medication is helping.  She referred patient to a Education officer, museum to help him with community resources.  Denton Brick routed her notes to [facility] yesterday, but she wanted to make Dr.Bedsole aware of the PHQ results.

## 2019-02-07 ENCOUNTER — Other Ambulatory Visit: Payer: Self-pay | Admitting: Pharmacy Technician

## 2019-02-07 ENCOUNTER — Other Ambulatory Visit: Payer: Self-pay | Admitting: Pharmacist

## 2019-02-07 NOTE — Telephone Encounter (Signed)
Noted. Patient and I have discussed his mood frequently in past.  Call if pt wishes to discuss mood further he can make a virtual OV.

## 2019-02-07 NOTE — Patient Outreach (Signed)
[facility]) Care Management  02/07/2019  NAZIR HACKER [birth year] [id number]                          Medication Assistance Referral  Referral From: [place]  Medication/Company: [employer] 78/93 and Trulicity  / [employer] Patient application portion:  Education officer, museum portion: Faxed  to Dr. Diona Browner   Follow up:  Will follow up with patient in 7-10 business days to confirm application(s) have been received.  Maud Deed Chana Bode [place] Certified Pharmacy Technician [place] Management Direct Dial:[phone]

## 2019-02-07 NOTE — Telephone Encounter (Signed)
Spoke with [name].  He denies appointment at this time.    He states he just feels depressed on some day but states he is doing alright.  He will call back to schedule virtual visit if the need arises.

## 2019-02-07 NOTE — Patient Outreach (Addendum)
[facility]) Care Management  [place]   02/07/2019  [name] May 01, 1951 [id number]  Reason for referral: Medication Review  Referral source: [employer] C-SNP Care Manager with [facility] Current insurance: [employer] C-SNP  PMHx includes but not limited to:  T2DM, retinopathy, CAD, NSTEMI x 3 in 2017, PCI to LAD, depression, HTN, HLD, morbid obesity, OA, kidney stones, GERD  Outreach:  Successful telephone call with [name].  HIPAA identifiers verified.   Subjective:  Patient reports he has no issues with paying for medication at this time as HTA is paying all co-pays.  He states he is concerned about costs once he enters the coverage gap though and won't be able to afford his insulin and Trulicity.  He states he has the One Touch glucometer and tries to check CBGs 2x daily, however sometimes forgets.  He reports being "tired" all the time and mentions his wife's death a few years ago as a primary reason.    Objective:  Lab Results  Component Value Date   CREATININE 1.06 10/18/2018   CREATININE 0.93 06/22/2018   CREATININE 0.97 02/27/2018    Lab Results  Component Value Date   HGBA1C 9.6 10/02/2018    Lipid Panel     Component Value Date/Time   CHOL 90 06/22/2018 0850   TRIG 102.0 06/22/2018 0850   HDL 30.80 (L) 06/22/2018 0850   CHOLHDL 3 06/22/2018 0850   VLDL 20.4 06/22/2018 0850   LDLCALC 39 06/22/2018 0850   LDLDIRECT 174.0 10/19/2017 1600    BP Readings from Last 3 Encounters:  10/18/18 128/68  09/11/18 130/84  06/26/18 134/60    Allergies  Allergen Reactions  . Atorvastatin Other (See Comments)    Body aches    Medications Reviewed Today    Reviewed by Luretha Rued, RN (Registered Nurse) on 02/05/19 at 1511  Med List Status: <None>  Medication Order Taking? Sig Documenting Provider Last Dose Status Informant  aspirin 81 MG EC tablet [id number] Yes Take 1 tablet (81 mg total) by mouth daily. Theora Gianotti, NP Taking Active Family Member           Med Note [place] Nunnery, Marva Panda May 01, 2016  3:28 PM)    Blood Glucose Monitoring Suppl (ACCU-CHEK GUIDE) w/Device Drucie Opitz [id number]   [provider]  Active   carvedilol (COREG) 6.25 MG tablet [id number] Yes Take 1 tablet (6.25 mg total) by mouth daily. Jinny Sanders, MD Taking Active   clopidogrel (PLAVIX) 75 MG tablet [id number] Yes TAKE 1 TABLET BY MOUTH ONCE DAILY WITH BREAKFAST Bedsole, Amy E, MD Taking Active   Dulaglutide (TRULICITY) 2.63 ZC/5.8IF SOPN [id number] Yes Inject 0.5 mLs into the skin once a week. Jinny Sanders, MD Taking Active   ezetimibe (ZETIA) 10 MG tablet [id number] Yes TAKE 1 TABLET BY MOUTH ONCE DAILY Gollan, Kathlene November, MD Taking Active   furosemide (LASIX) 40 MG tablet [id number] Yes Take 1 tablet (40 mg total) by mouth 2 (two) times daily. Jinny Sanders, MD Taking Active            Med Note Luretha Rued   Tue Feb 05, 2019  3:08 PM) Takes once a day.  insulin NPH-regular Human (NOVOLIN 70/30) (70-30) 100 UNIT/ML injection [id number] Yes Inject 70 units in the morning and 40 units at bedtime. Jinny Sanders, MD Taking Active   isosorbide mononitrate (IMDUR) 30 MG 24 hr tablet [id number] Yes TAKE  1 TABLET BY MOUTH TWICE A DAY Gollan, Kathlene November, MD Taking Active   Lancets (ONETOUCH DELICA PLUS QIWLNL89Q) [place] [id number]   [provider]  Active   losartan (COZAAR) 50 MG tablet [id number] Yes Take 1 tablet (50 mg total) by mouth daily. Jinny Sanders, MD Taking Active   Misc. Devices KIT [id number]  One touch ultra glucometer Check blood sugar 1-2 times daily  Dx 13.39 Diona Browner, Amy E, MD  Active   Misc. Devices MISC [id number]  One touch Ultra Test Strips  Check blood sugar fasting and 2 hours after meals  Dx 13.39 Bedsole, Amy E, MD  Active   nitroGLYCERIN (NITROSTAT) 0.4 MG SL tablet [id number] Yes DISSOLVE 1 TABLET UNDER TONGUE AS NEEDEDFOR CHEST PAIN. MAY REPEAT 5 MINUTES APART 3 TIMES IF NEEDED  Gollan, Kathlene November, MD Taking Active   ONE TOUCH ULTRA TEST test strip [id number]  USE ONE STRIP TO CHECK GLUCOSE TWICE DAILY Bedsole, Amy E, MD  Active   potassium chloride SA (K-DUR) 20 MEQ tablet [id number] Yes Take 1 tablet (20 mEq total) by mouth daily. Jinny Sanders, MD Taking Active   PRESCRIPTION MEDICATION [id number] Yes Eye drops - administer day before and day after eye injections or laser treatments - next office visit mid December 2017 [provider] Taking Active Family Member           Med Note Samara Snide, CALLIE M   Tue Sep 20, 2016  8:11 AM) Pt still does not know the name of the eye drops  rosuvastatin (CRESTOR) 40 MG tablet [id number] Yes Take 1 tablet (40 mg total) by mouth daily. Minna Merritts, MD Taking Active   triamcinolone cream (KENALOG) 0.5 % [id number] Yes Apply topically twice daily to arms Jinny Sanders, MD Taking Active            Med Note Luretha Rued   Tue Feb 05, 2019  3:10 PM) As needed.  venlafaxine XR (EFFEXOR-XR) 150 MG 24 hr capsule [id number] Yes Take 1 capsule (150 mg total) by mouth daily with breakfast. Diona Browner, Amy E, MD Taking Active   venlafaxine XR (EFFEXOR-XR) 75 MG 24 hr capsule [id number] Yes Take 1 capsule (75 mg total) by mouth daily. in addition to the 150 mg XL already taking to get to a total dose of 225 mg daily. Jinny Sanders, MD Taking Active           Assessment: Drugs sorted by system:  Neurologic/Psychologic: venlafaxine  Hematologic: aspirin, clopidogrel  Cardiovascular: carvedilol, isosorbide mononitrate, losartan, rosuvastatin, ezetimibe, furosemide, potassium, NTG SL  Endocrine: Novolin 70/30, dulaglutide  Medication Review Findings:  . Allergy to statins: Tolerating rosuvastatin . Carvedilol:  Prescribed once daily, usual dose is BID, noted telephone note from 4/29 with MD ok to increase to BID.   . Isosorbide mononitrate:  Prescribed BID, usual dose once daily, noted change in dose by cardiologist in 2017 for  possible anginal symptoms  Medication Assistance Findings:  Medication assistance needs identified: Trulicity, Novolin 01/77  Extra Help:  Not eligible for Extra Help Low Income Subsidy based on reported income and assets  Patient Assistance Programs: Trulicity made by [employer] o Income requirement met: Yes o Out-of-pocket prescription expenditure met:   Not Applicable - Patient has met application requirements to apply for this patient assistance program.   - Reviewed program requirements with patient.    Novolin 70/30 made by [place] requirement met: Yes o Out-of-pocket prescription expenditure met:  Not Applicable - Patient has met application requirements to apply for this patient assistance program.   - Reviewed program requirements with patient.    Plan: . Will contact Dr. Zachery Dauer  regarding patient assistance programs .  Patient could substitute Trulicity --> Ozempic and apply to one program, [employer] for both GLP-1 agonist and insulin OR substitute Novolin to Humulin 70/30 and apply to [employer] for GLP-1 agonist and insulin.  . Will contact Dr. Rockey Situ regarding carvedilol dosing  Ralene Bathe, PharmD, [place] [phone]     Addendum: Will assist patient with applying for Trulicity and Humulin 92/95.  I will route patient assistance letter to [place] technician who will coordinate patient assistance program application process for medications listed above.  [facility] pharmacy technician will assist with obtaining all required documents from both patient and provider(s) and submit application(s) once completed.    Ralene Bathe, PharmD, [place] [phone]

## 2019-02-07 NOTE — Progress Notes (Signed)
I would prefer 1 or 3  Thanks,  [name], MD [place] at [facility]

## 2019-02-11 ENCOUNTER — Ambulatory Visit: Payer: Self-pay | Admitting: Pharmacist

## 2019-02-12 ENCOUNTER — Telehealth: Payer: Self-pay | Admitting: *Deleted

## 2019-02-12 MED ORDER — CARVEDILOL 6.25 MG PO TABS: 6.2500 mg | ORAL_TABLET | Freq: Every day | ORAL | 3 refills | Status: DC

## 2019-02-12 NOTE — Telephone Encounter (Signed)
Spoke with Dr. Rockey Situ regarding patients carvedilol 6.25 mg and he reports that patient has been on once a day dosing for years. Previous message mentioned that if needed we could increase but patient has continued to take once a day. Reviewed with patient to see how he has been taking it and he has in fact been taking once a day for years. Notes revealed that nurse was just getting clarification on dosage since this is usually a twice a day medication but after discussion with Dr. Rockey Situ and confirmation with patient we are going to leave his prescription at once a day. Will route message to pharmacy member and let her know. Discussed with patient to continue taking once a day and to let us know if he should have any further questions or concerns.

## 2019-02-12 NOTE — Telephone Encounter (Signed)
-----   Message from Minna Merritts, MD sent at 02/09/2019 11:19 PM EDT -----  ----- Message ----- From: Rudean Haskell, [facility] Sent: 02/07/2019  11:14 AM EDT To: Minna Merritts, MD  Hi Dr. Rockey Situ,  I spoke with Mr. Eyerman today for a medication review.  He is still taking carvedilol once daily based on prescription label instructions.  I saw you had ok'd increase to BID last month.  Can you please send in new RX to pharmacy with updated instructions?  This may help him remember correct dosing.   Thanks so much, Ralene Bathe, PharmD, [place] [phone]

## 2019-02-18 ENCOUNTER — Other Ambulatory Visit: Payer: Self-pay | Admitting: *Deleted

## 2019-02-18 ENCOUNTER — Encounter: Payer: Self-pay | Admitting: *Deleted

## 2019-02-18 NOTE — Patient Outreach (Signed)
[facility] [place] [facility]) Care Management  02/18/2019  TRESEAN MATTIX 14-May-1951 [id number]   CSW was able to make initial contact with patient today to perform phone assessment, as well as assess and assist with social work needs and services.  CSW introduced self, explained role and types of services provided through [facility] ([place] [facility]).  CSW further explained to patient that CSW works with patient's C-SNP Chronic Care Management Coordinator, also with [place] [facility], Thea Silversmith. CSW then explained the reason for the call, indicating that Ms. Juleen China thought that patient would benefit from social work services and resources to assist with counseling and supportive services for symptoms of depression.  CSW obtained two HIPAA compliant identifiers from patient, which included patient's name and date of birth.  Patient admitted that he is still grieving the loss of his wife, who died roughly two and a half years ago.  Patient reported that he and his wife had been together since the 4th grade, and married for 53 years.  Patient went on to explain that he and his wife were always together, as they did everything as a couple and were basically inseparable.  Patient indicated that he is now trying to learn to live his life without her, which is most difficult in the evenings and on weekends.  Patient stated that he stopped taking his antidepressant medication for a while because he did not think it was working, but has since started back and now admits to taking the medication exactly as prescribed.  CSW noted that patient is currently working with Ralene Bathe, Pharmacist with [facility] regarding medication regimen.  Patient reported that he is taking Effexor-XR 150 MG PO Daily with breakfast, as well as 75 MG PO Daily.  CSW offered to provide telephonic counseling and supportive services to patient, at least until COVID-19  restrictions are lifted, and then in-home face-to-face counseling services, but patient denied, indicating that he is "managing well".  CSW then offered to help patient get established with a counselor/therapist in [facility] that accepts [employer], agreeing to mail patient a complete listing of counselors/therapists, but again, patient declined.  Last, CSW spoke with patient at length about Grief and Loss Counseling services, offered through [facility] (now Manufacturing engineer), but patient was not the least bit interested in receiving information or having CSW make a referral.  CSW will perform a case closure on patient, as patient denies the needs for social work services at this time, nor is patient interested in receiving counseling and supportive services for symptoms of grief and loss and depression.  CSW will notify patient's C-SNP Chronic Care Management Coordinator with [place] [facility], Thea Silversmith of CSW's plans to close patient's case.  CSW will fax an update to patient's Primary Care Physician, Dr. [name] to ensure that they are aware of CSW's involvement with patient's plan of care.  CSW was able to confirm that patient has the correct contact information for CSW, encouraging patient to contact CSW directly if he changes his mind about receiving social work services.  Patient voiced understanding and was agreeable to this plan.    Nat Christen, BSW, MSW, LCSW  Licensed Education officer, [facility]  Mailing [place] [address], [place], [place] [address] Physical Address-[address], [place], [place] [address] Toll Free Main # [phone] Fax # [phone] Cell # [phone]  Office # [phone] [email or web address]

## 2019-02-19 DIAGNOSIS — E113393 Type 2 diabetes mellitus with moderate nonproliferative diabetic retinopathy without macular edema, bilateral: Secondary | ICD-10-CM | POA: Diagnosis not present

## 2019-02-19 DIAGNOSIS — H34812 Central retinal vein occlusion, left eye, with macular edema: Secondary | ICD-10-CM | POA: Diagnosis not present

## 2019-02-19 DIAGNOSIS — H3582 Retinal ischemia: Secondary | ICD-10-CM | POA: Diagnosis not present

## 2019-02-19 DIAGNOSIS — H348112 Central retinal vein occlusion, right eye, stable: Secondary | ICD-10-CM | POA: Diagnosis not present

## 2019-03-04 ENCOUNTER — Telehealth: Payer: Self-pay | Admitting: *Deleted

## 2019-03-04 NOTE — Telephone Encounter (Signed)
Yes he can stop the medication. Have him call his insurance and find out if byetta or victoza cheaper option.

## 2019-03-04 NOTE — Telephone Encounter (Signed)
Patient called stating that he has been on Trulicity for two months and the cost has been $4200. Patient stated that the medication is too expensive and he wants to know if he can come off of it?  Patient stated that he does not understanding insurance, but he is in the donut hole.

## 2019-03-04 NOTE — Telephone Encounter (Signed)
If patient is in the donut hole, everything will be expensive because he will have to pay out of pocket for.

## 2019-03-06 ENCOUNTER — Other Ambulatory Visit: Payer: Self-pay | Admitting: Pharmacist

## 2019-03-06 ENCOUNTER — Other Ambulatory Visit: Payer: Self-pay | Admitting: Pharmacy Technician

## 2019-03-06 DIAGNOSIS — H34812 Central retinal vein occlusion, left eye, with macular edema: Secondary | ICD-10-CM | POA: Diagnosis not present

## 2019-03-06 NOTE — Patient Outreach (Signed)
[facility]) Care Management  [place] 03/06/2019  [name] 19-Jan-1951 [id number]  Communication received from [employer] that patient called regarding concerns about affording medication.   F/u call placed to patient.  Patient was mailed paperwork to complete for patient assistance program for Trulicity and Humulin 92/42 on 02/07/2019.  Paperwork has not yet been returned to [name].  Patient states he has poor eyesight and usually leaves his mail in a pile for family to help him review.  He will ask his daughter to go through his mail this afternoon.  I reviewed with patient that the applications will be in a [employer] envelope and will include my contact information in a letter if he or his daughter need to reach out to me with questions.  Patient voiced understanding.   Plan: Will route note to [place] technician to f/u again with patient re: paperwork in the next few days.   Ralene Bathe, PharmD, [place] [phone]

## 2019-03-08 ENCOUNTER — Other Ambulatory Visit: Payer: Self-pay

## 2019-03-08 ENCOUNTER — Telehealth: Payer: Self-pay | Admitting: Family Medicine

## 2019-03-08 ENCOUNTER — Other Ambulatory Visit: Payer: Self-pay | Admitting: Pharmacy Technician

## 2019-03-08 DIAGNOSIS — E1339 Other specified diabetes mellitus with other diabetic ophthalmic complication: Secondary | ICD-10-CM

## 2019-03-08 DIAGNOSIS — IMO0002 Reserved for concepts with insufficient information to code with codable children: Secondary | ICD-10-CM

## 2019-03-08 NOTE — Patient Outreach (Signed)
[facility]) Care Management  03/08/2019  [name] 1950-09-09 [id number]    Successful call placed to patient regarding patient assistance application(s) for Trulicity and Humulin , HIPAA identifiers verified. [name] states that his daughter is going going to come by over the weekend and look thru his mail to confirm he has received the application.   Follow up:  Will follow up with patient in 2-3 business days to verify application was received.  Maud Deed Chana Bode [place] Certified Pharmacy Technician [place] Management Direct Dial:[phone]

## 2019-03-08 NOTE — Telephone Encounter (Signed)
-----   Message from Cloyd Stagers, RT sent at 03/07/2019  4:10 PM EDT ----- Regarding: Lab Orders for Monday 7.13.2020 Please place lab orders for Monday 7.13.2020, office visit for physical on Tuesday 7.21.2020 Thank you, Dyke Maes RT(R)

## 2019-03-08 NOTE — Patient Outreach (Signed)
  [facility]) Care Management Chronic Special Needs Program    03/08/2019  Name: [name], DOB: 09/04/1950  MRN: [id number]   [name] is enrolled in a chronic special needs plan. Referral from healthcare concierge regarding medication assistance need. [facility] pharmacy actively involved.  Plan: RNCM will continue to follow.  Thea Silversmith, RN, MSN, [place] [phone]

## 2019-03-11 ENCOUNTER — Other Ambulatory Visit: Payer: Self-pay | Admitting: Pharmacy Technician

## 2019-03-11 NOTE — Patient Outreach (Signed)
[facility]) Care Management  03/11/2019  [name] [birth year] [id number]    Successful call placed to patient regarding patient assistance application(s) for Trulicity and Humulin , HIPAA identifiers verified. [name] states that he and his daughter were unable to go through his mail due to attending a wedding this weekend. He states that he will go thru it by Wednesday. Requested that he contact me to let me know whether or not he comes across it. Provided patient with my contact number.  Follow up:  Will follow up with patient in 3-5 business days if call has not been returned.  Maud Deed Chana Bode [place] Certified Pharmacy Technician [place] Management Direct Dial:[phone]

## 2019-03-12 ENCOUNTER — Other Ambulatory Visit (INDEPENDENT_AMBULATORY_CARE_PROVIDER_SITE_OTHER): Payer: HMO

## 2019-03-12 ENCOUNTER — Ambulatory Visit: Payer: Medicare Other

## 2019-03-12 DIAGNOSIS — E1365 Other specified diabetes mellitus with hyperglycemia: Secondary | ICD-10-CM | POA: Diagnosis not present

## 2019-03-12 DIAGNOSIS — IMO0002 Reserved for concepts with insufficient information to code with codable children: Secondary | ICD-10-CM

## 2019-03-12 DIAGNOSIS — E1339 Other specified diabetes mellitus with other diabetic ophthalmic complication: Secondary | ICD-10-CM

## 2019-03-12 LAB — COMPREHENSIVE METABOLIC PANEL
ALT: 19 U/L (ref 0–53)
AST: 16 U/L (ref 0–37)
Albumin: 3.9 g/dL (ref 3.5–5.2)
Alkaline Phosphatase: 85 U/L (ref 39–117)
BUN: 19 mg/dL (ref 6–23)
CO2: 29 mEq/L (ref 19–32)
Calcium: 8.8 mg/dL (ref 8.4–10.5)
Chloride: 102 mEq/L (ref 96–112)
Creatinine, Ser: 1.07 mg/dL (ref 0.40–1.50)
GFR: 68.46 mL/min (ref >60.00)
Glucose, Bld: 362 mg/dL — ABNORMAL HIGH (ref 70–99)
Potassium: 4.8 mEq/L (ref 3.5–5.1)
Sodium: 139 mEq/L (ref 135–145)
Total Bilirubin: 0.5 mg/dL (ref 0.2–1.2)
Total Protein: 6.7 g/dL (ref 6.0–8.3)

## 2019-03-12 LAB — HEMOGLOBIN A1C: Hgb A1c MFr Bld: 9.1 % — ABNORMAL HIGH (ref 4.6–6.5)

## 2019-03-12 LAB — LIPID PANEL
Cholesterol: 180 mg/dL (ref 0–200)
HDL: 35.5 mg/dL — ABNORMAL LOW (ref >39.00)
LDL Cholesterol: 115 mg/dL — ABNORMAL HIGH (ref 0–99)
NonHDL: 144.76
Total CHOL/HDL Ratio: 5
Triglycerides: 147 mg/dL (ref 0.0–149.0)
VLDL: 29.4 mg/dL (ref 0.0–40.0)

## 2019-03-12 NOTE — Telephone Encounter (Signed)
Will discuss med replacments at upcoming [place] 03/19/2019

## 2019-03-18 ENCOUNTER — Other Ambulatory Visit: Payer: Self-pay | Admitting: Pharmacy Technician

## 2019-03-18 NOTE — Patient Outreach (Signed)
[facility]) Care Management  03/18/2019  VIRAJ LIBY [birth year] [id number]    Successful call placed to patient regarding patient assistance application(s) for Trulicity and Humulin , HIPAA identifiers verified. Mr. Hubbert confirms that he received the application and that his daughter is coming to help him fill it out tonight. He states he will try to have it in the mail tomorrow.  Follow up:  Will submit application to [employer] once all documents have been received.  Maud Deed Chana Bode [place] Certified Pharmacy Technician [place] Management Direct Dial:[phone]

## 2019-03-19 ENCOUNTER — Encounter: Payer: Self-pay | Admitting: Family Medicine

## 2019-03-19 ENCOUNTER — Other Ambulatory Visit: Payer: Self-pay

## 2019-03-19 ENCOUNTER — Ambulatory Visit (INDEPENDENT_AMBULATORY_CARE_PROVIDER_SITE_OTHER): Payer: HMO | Admitting: Family Medicine

## 2019-03-19 VITALS — BP 130/66 | HR 89 | Temp 98.6°F | Ht 66.5 in | Wt 289.8 lb

## 2019-03-19 DIAGNOSIS — Z125 Encounter for screening for malignant neoplasm of prostate: Secondary | ICD-10-CM

## 2019-03-19 DIAGNOSIS — Z9111 Patient's noncompliance with dietary regimen: Secondary | ICD-10-CM

## 2019-03-19 DIAGNOSIS — Z0000 Encounter for general adult medical examination without abnormal findings: Secondary | ICD-10-CM

## 2019-03-19 DIAGNOSIS — Z9114 Patient's other noncompliance with medication regimen: Secondary | ICD-10-CM | POA: Diagnosis not present

## 2019-03-19 DIAGNOSIS — I25118 Atherosclerotic heart disease of native coronary artery with other forms of angina pectoris: Secondary | ICD-10-CM

## 2019-03-19 DIAGNOSIS — F331 Major depressive disorder, recurrent, moderate: Secondary | ICD-10-CM | POA: Diagnosis not present

## 2019-03-19 DIAGNOSIS — E1339 Other specified diabetes mellitus with other diabetic ophthalmic complication: Secondary | ICD-10-CM | POA: Diagnosis not present

## 2019-03-19 DIAGNOSIS — E785 Hyperlipidemia, unspecified: Secondary | ICD-10-CM | POA: Diagnosis not present

## 2019-03-19 DIAGNOSIS — I110 Hypertensive heart disease with heart failure: Secondary | ICD-10-CM | POA: Diagnosis not present

## 2019-03-19 DIAGNOSIS — E1365 Other specified diabetes mellitus with hyperglycemia: Secondary | ICD-10-CM | POA: Diagnosis not present

## 2019-03-19 DIAGNOSIS — IMO0002 Reserved for concepts with insufficient information to code with codable children: Secondary | ICD-10-CM

## 2019-03-19 DIAGNOSIS — I10 Essential (primary) hypertension: Secondary | ICD-10-CM

## 2019-03-19 LAB — HM DIABETES FOOT EXAM

## 2019-03-19 LAB — PSA, MEDICARE: PSA: 0.3 ng/ml (ref 0.10–4.00)

## 2019-03-19 NOTE — Assessment & Plan Note (Signed)
Well controlled. Continue current medication.  

## 2019-03-19 NOTE — Assessment & Plan Note (Signed)
No longer at goal.. pt thinks he may have missed statin for 1 month accidentally.. now back on.

## 2019-03-19 NOTE — Assessment & Plan Note (Signed)
He is doing much better with compliance in last year.

## 2019-03-19 NOTE — Assessment & Plan Note (Signed)
Euvo,emic in office today.

## 2019-03-19 NOTE — Patient Instructions (Addendum)
Please stop at the lab to have labs drawn. Call if interested in counselor. We will request a copy of recent eye exam.   [name] , Thank you for taking time to come for your Medicare Wellness Visit. I appreciate your ongoing commitment to your health goals. Please review the following plan we discussed and let me know if I can assist you in the future.   These are the goals we discussed: Goals    . Client understands the importance of follow-up with providers by attending scheduled visits    . Client verbalize knowledge of Heart Failure disease self management skills within 6 months.    . Client verbalizes knowledge of Heart Attack self management skills within the next 6 months.    . Client will report weighing and recording weights daily within 6 months.    . Client will verbalize knowledge of diabetes self-management as evidenced by Hgb A1C <7 or as defined by provider.     Diabetes self management actions:  Glucose monitoring per provider recommendations  Perform Quality checks on blood meter  Eat Healthy  Check feet daily  Visit provider every 3-6 months as directed  Hbg A1C level every 3-6 months.  Eye Exam yearly    . Client will verbalize knowledge of self management of Hypertension as evidences by BP reading of 140/90 or less; or as defined by provider    . Increase physical activity     Starting 02/21/17, I will attempt to walk at least 15 min daily.     . Maintain timely refills of Heart Failure medication as prescribed within the year     . Obtain annual  Lipid Profile, LDL-C    . Patient Stated     Starting 02/27/2018, I will continue to take medications as prescribed.     . Visit Primary Care Provider or Cardiologist at least 2 times per year       This is a list of the screening recommended for you and due dates:  Health Maintenance  Topic Date Due  . Tetanus Vaccine  06/29/2017  . Complete foot exam   01/17/2019  . Eye exam for diabetics  02/24/2019   . Flu Shot  03/30/2019  . Colon Cancer Screening  09/04/2019  . Hemoglobin A1C  09/12/2019  .  Hepatitis C: One time screening is recommended by Center for Disease Control  (CDC) for  adults born from [birth year] through 1966.   Completed  . Pneumonia vaccines  Completed

## 2019-03-19 NOTE — Assessment & Plan Note (Signed)
Encouraged exercise, weight loss, healthy eating habits. ? ?

## 2019-03-19 NOTE — Assessment & Plan Note (Signed)
Followed by cards °

## 2019-03-19 NOTE — Progress Notes (Signed)
Chief Complaint  Patient presents with  . Medicare Wellness    Wants to discuss Trulicity.     History of Present Illness: HPI  The patient presents for annual medicare wellness, complete physical and review of chronic health problems. He/She also has the following acute concerns today: none  I have personally reviewed the Medicare Annual Wellness questionnaire and have noted 1. [name] medical and social history 2. Their use of alcohol, tobacco or illicit drugs 3. Their current medications and supplements 4. [name] functional ability including ADL's, fall risks, home safety risks and hearing or visual             impairment. 5. Diet and physical activities 6. Evidence for depression or mood disorders 7.         Updated provider list Cognitive evaluation was performed and recorded on pt medicare questionnaire form. The patients weight, height, BMI and visual acuity have been recorded in the chart  I have made referrals, counseling and provided education to the patient based review of the above and I have provided the pt with a written personalized care plan for preventive services.   Documentation of this information was scanned into the electronic record under the media tab.   Advance directives and end of life planning reviewed in detail with patient and documented in EMR. Patient given handout on advance care directives if needed. HCPOA and living will updated if needed.  Hearing Screening   125Hz  250Hz  500Hz  1000Hz  2000Hz  3000Hz  4000Hz  6000Hz  8000Hz   Right ear:   20 20 20   0    Left ear:   20 20 20   0    Vision Screening Comments: Last eye exam, 02/2019   Depression screen [facility] 2/9 03/19/2019 02/18/2019 02/05/2019  Decreased Interest 3 1 3   Down, Depressed, Hopeless 3 1 1   PHQ - 2 Score 6 2 4   Altered sleeping 0 0 3  Tired, decreased energy 3 0 3  Change in appetite 0 1 0  Feeling bad or failure about yourself  1 0 0  Trouble concentrating 1 1 1   Moving slowly  or fidgety/restless 1 1 0  Suicidal thoughts 0 0 0  PHQ-9 Score 12 5 11   Difficult doing work/chores - Somewhat difficult Extremely dIfficult   Fall Risk  03/19/2019 02/18/2019 02/27/2018 02/21/2017 11/20/2015  Falls in the past year? 1 0 Yes No No  Comment - - fell while walking dog; fell at church after missing a step - -  Number falls in past yr: 1 0 2 or more - -  Injury with Fall? 0 0 Yes - -  Risk Factor Category  - - High Fall Risk - -  Risk for fall due to : - Other (Comment) - - -  Follow up - Education provided;Falls prevention discussed - - -   Diabetes:  Cannot afford Trulicity  as in donut hole.. had to stop... but [place] now helping apply for assistance. A1C improved in last 9 months down from 10.  Lab Results  Component Value Date   HGBA1C 9.1 (H) 03/12/2019  Using medications without difficulties: Hypoglycemic episodes:? Hyperglycemic episodes:? Feet problems: no ulcer Blood Sugars averaging:not checking eye exam within last year: frequently Wt Readings from Last 3 Encounters:  03/19/19 289 lb 12 oz (131.4 kg)  01/15/19 282 lb (127.9 kg)  01/01/19 280 lb (127 kg)   Elevated Cholesterol:  LDL not at goal < 70.. has been previosly well controlled on crestor. Had stopped for  1 month by accident. Lab Results  Component Value Date   CHOL 180 03/12/2019   HDL 35.50 (L) 03/12/2019   LDLCALC 115 (H) 03/12/2019   LDLDIRECT 174.0 10/19/2017   TRIG 147.0 03/12/2019   CHOLHDL 5 03/12/2019  Using medications without problems: Muscle aches:  Diet compliance: Exercise: mowing yards, but that is it. Other complaints: CAD followed by Dr. Rockey Situ  Hypertension:    BP at goal  BP Readings from Last 3 Encounters:  03/19/19 130/66  10/18/18 128/68  09/11/18 130/84  Using medication without problems or lightheadedness: none Chest pain with exertion:none Edema:none Short of breath:none Average home BPs: Other issues:  MDD:  Gradually improiving.. Trying to do more..  Get out of bed. On venlafaxine 225 mg daily.  Fatigue chronic is stable but difficult for him.   GERD:  Pantoprazole in addition to zantac.   Chronic chest pain: helped with nitroglycerin   ED: multifactorial.. Due to obesity, meds, DM, CAD etc.   Patient Care Team: Jinny Sanders, MD as PCP - General Rockey Situ Kathlene November, MD as Consulting Physician (Cardiology) Luretha Rued, RN as [place] Management [place], Octavio Graves, [facility] as [place] Management (Pharmacist) Adaline Sill, CPhT as [place] Research scientist (life sciences))  COVID 19 screen No recent travel or known exposure to [place] The patient denies respiratory symptoms of COVID 19 at this time.  The importance of social distancing was discussed today.   Review of Systems  Constitutional: Negative for chills and fever.  HENT: Negative for congestion and ear pain.   Eyes: Negative for pain and redness.  Respiratory: Negative for cough and shortness of breath.   Cardiovascular: Negative for chest pain, palpitations and leg swelling.  Gastrointestinal: Negative for abdominal pain, blood in stool, constipation, diarrhea, nausea and vomiting.  Genitourinary: Negative for dysuria.  Musculoskeletal: Negative for falls and myalgias.  Skin: Negative for rash.  Neurological: Negative for dizziness.  Psychiatric/Behavioral: Negative for depression. The patient is not nervous/anxious.       Past Medical History:  Diagnosis Date  . Back injury 02/2002   worker's comp  . CHF (congestive heart failure) ([place])   . Coronary artery disease, non-occlusive    a. cath 2002 with no sig CAD;  b. cath 2008 normal LM, LAD, LCx, p&dRCA 20-30%, PDA 30%; c.11/2015 NSTEMI/PCI: LM nl, LAD 95p (2.5x15 Xience DES), LCX nl, RCA 100p/m w/ L->R collats, EF 55-65% c. NSTEMI (02/2016) with no culprit leision, switched to Brilinta.  d. NSTEMI 03/2016: again, no culprit lesion and switched back  to plavix 2/2 SOB with Brilnta.    . Depression   . Diabetes mellitus type 2, insulin dependent ([place])   . Hyperlipemia   . Hypertensive heart disease   . Kidney stones   . Morbid obesity ([place])   . Osteoarthritis   . Snoring     reports that he has never smoked. He has never used smokeless tobacco. He reports that he does not drink alcohol or use drugs.   Current Outpatient Medications:  .  aspirin 81 MG EC tablet, Take 1 tablet (81 mg total) by mouth daily., Disp: , Rfl:  .  carvedilol (COREG) 6.25 MG tablet, Take 1 tablet (6.25 mg total) by mouth daily. ONCE A DAY, Disp: 90 tablet, Rfl: 3 .  clopidogrel (PLAVIX) 75 MG tablet, TAKE 1 TABLET BY MOUTH ONCE DAILY WITH BREAKFAST, Disp: 90 tablet, Rfl: 1 .  Dulaglutide (TRULICITY) 8.36 OQ/9.4TM SOPN, Inject  0.5 mLs into the skin once a week., Disp: 3 mL, Rfl: 11 .  ezetimibe (ZETIA) 10 MG tablet, TAKE 1 TABLET BY MOUTH ONCE DAILY, Disp: 90 tablet, Rfl: 3 .  furosemide (LASIX) 40 MG tablet, Take 1 tablet (40 mg total) by mouth 2 (two) times daily., Disp: 180 tablet, Rfl: 1 .  insulin NPH-regular Human (NOVOLIN 70/30) (70-30) 100 UNIT/ML injection, Inject 70 units in the morning and 40 units at bedtime., Disp: 50 mL, Rfl: 5 .  isosorbide mononitrate (IMDUR) 30 MG 24 hr tablet, TAKE 1 TABLET BY MOUTH TWICE A DAY, Disp: 180 tablet, Rfl: 3 .  Lancets (ONETOUCH DELICA PLUS ZOXWRU04V) MISC, , Disp: , Rfl:  .  losartan (COZAAR) 50 MG tablet, Take 1 tablet (50 mg total) by mouth daily., Disp: 90 tablet, Rfl: 1 .  Misc. Devices KIT, One touch ultra glucometer Check blood sugar 1-2 times daily  Dx 13.39, Disp: 1 each, Rfl: 0 .  Misc. Devices MISC, One touch Ultra Test Strips  Check blood sugar fasting and 2 hours after meals  Dx 13.39, Disp: 100 each, Rfl: 11 .  nitroGLYCERIN (NITROSTAT) 0.4 MG SL tablet, DISSOLVE 1 TABLET UNDER TONGUE AS NEEDEDFOR CHEST PAIN. MAY REPEAT 5 MINUTES APART 3 TIMES IF NEEDED, Disp: 25 tablet, Rfl: 3 .  potassium chloride SA  (K-DUR) 20 MEQ tablet, Take 1 tablet (20 mEq total) by mouth daily., Disp: 90 tablet, Rfl: 1 .  rosuvastatin (CRESTOR) 40 MG tablet, Take 1 tablet (40 mg total) by mouth daily., Disp: 90 tablet, Rfl: 3 .  venlafaxine XR (EFFEXOR-XR) 150 MG 24 hr capsule, Take 1 capsule (150 mg total) by mouth daily with breakfast. (Patient taking differently: Take 150 mg by mouth daily with breakfast. Take with Effexor-XR 22m for a total dose of 2247m, Disp: 90 capsule, Rfl: 1 .  venlafaxine XR (EFFEXOR-XR) 75 MG 24 hr capsule, Take 1 capsule (75 mg total) by mouth daily. in addition to the 150 mg XL already taking to get to a total dose of 225 mg daily., Disp: 90 capsule, Rfl: 1   Observations/Objective: Blood pressure 130/66, pulse 89, temperature 98.6 F (37 C), temperature source Temporal, height 5' 6.5" (1.689 m), weight 289 lb 12 oz (131.4 kg), SpO2 97 %.  Physical Exam Constitutional:      General: He is not in acute distress.    Appearance: Normal appearance. He is well-developed. He is obese. He is not ill-appearing or toxic-appearing.  HENT:     Head: Normocephalic and atraumatic.     Right Ear: Hearing, tympanic membrane, ear canal and external ear normal.     Left Ear: Hearing, tympanic membrane, ear canal and external ear normal.     Nose: Nose normal.     Mouth/Throat:     Pharynx: Uvula midline.  Eyes:     General: Lids are normal. Lids are everted, no foreign bodies appreciated.     Conjunctiva/sclera: Conjunctivae normal.     Pupils: Pupils are equal, round, and reactive to light.  Neck:     Musculoskeletal: Normal range of motion and neck supple.     Thyroid: No thyroid mass or thyromegaly.     Vascular: No carotid bruit.     Trachea: Trachea and phonation normal.  Cardiovascular:     Rate and Rhythm: Normal rate and regular rhythm.     Pulses: Normal pulses.     Heart sounds: S1 normal and S2 normal. Heart sounds not distant. No murmur. No friction rub.  No gallop.      Comments:  No peripheral edema Pulmonary:     Effort: Pulmonary effort is normal. No respiratory distress.     Breath sounds: Normal breath sounds. No wheezing, rhonchi or rales.  Abdominal:     General: Bowel sounds are normal.     Palpations: Abdomen is soft.     Tenderness: There is no abdominal tenderness. There is no guarding or rebound.     Hernia: No hernia is present.  Lymphadenopathy:     Cervical: No cervical adenopathy.  Skin:    General: Skin is warm and dry.     Findings: No rash.  Neurological:     Mental Status: He is alert.     Cranial Nerves: No cranial nerve deficit.     Sensory: No sensory deficit.     Gait: Gait normal.     Deep Tendon Reflexes: Reflexes are normal and symmetric.  Psychiatric:        Speech: Speech normal.        Behavior: Behavior normal.        Thought Content: Thought content normal.        Judgment: Judgment normal.       Foot exam:  Normal sensation.  No peripheral swelling  No ulcers on feet.  Assessment and Plan  [name] preventative maintenance and recommended screening tests for an annual wellness exam were reviewed in full today. Brought up to date unless services declined.  Counselled on the importance of diet, exercise, and its role in overall health and mortality. [name] FH and SH was reviewed, including their home life, tobacco status, and drug and alcohol status.   Refused tetanus vaccine. uptodate with eye exam Colonoscopy 2011 due in 2021 Due for repeat PSA Lab Results  Component Value Date   PSA 0.35 02/21/2017   PSA 0.27 10/19/2015   PSA 0.25 02/22/2013    Major depressive disorder, recurrent episode, moderate On high dose venlafaxine. Pt states he is not doing so great with this but medicaiotn definitely helps. He refuses counselor , support group for bereavement or psychiatrist.  " It's just me".  Morbid obesity ([place]) Encouraged exercise, weight loss, healthy eating habits.   Noncompliance with  diet and medication regimen He is doing much better with compliance in last year.   Hypertensive heart disease with heart failure ([place]) Euvo,emic in office today.  Hyperlipidemia LDL goal <70 No longer at goal.. pt thinks he may have missed statin for 1 month accidentally.. now back on.  Essential hypertension, benign Well controlled. Continue current medication.   DM (diabetes mellitus), secondary, uncontrolled, w/eye complications ([place]) Improving control but still poor. [facility] pharmacist helping with med assistance. If trulicity more affordable.. will plan in increasing dose.  Coronary artery disease of native artery of native heart with stable angina pectoris ([place]) Followed by cards.    [name], MD

## 2019-03-19 NOTE — Assessment & Plan Note (Signed)
On high dose venlafaxine. Pt states he is not doing so great with this but medicaiotn definitely helps. He refuses counselor , support group for bereavement or psychiatrist.  " It's just me".

## 2019-03-19 NOTE — Assessment & Plan Note (Signed)
Improving control but still poor. [facility] pharmacist helping with med assistance. If trulicity more affordable.. will plan in increasing dose.

## 2019-03-21 ENCOUNTER — Other Ambulatory Visit: Payer: Self-pay | Admitting: Pharmacy Technician

## 2019-03-21 NOTE — Patient Outreach (Signed)
[place] [facility]) Care Management  03/21/2019  [name] 1951/02/26 [id number]   Received patient portion(s) of patient assistance application for Trulicity and Humulin. Faxed completed application and required documents into [employer].  Will follow up with company in 3-5 business days to check status of application.  Maud Deed Chana Bode [place] Certified Pharmacy Technician [place] Management Direct Dial:[phone]

## 2019-03-25 ENCOUNTER — Other Ambulatory Visit: Payer: Self-pay | Admitting: Pharmacy Technician

## 2019-03-25 NOTE — Patient Outreach (Signed)
[place] [facility]) Care Management  03/25/2019  [name] [birth year] [id number]    Follow up call placed to [facility] regarding patient assistance application(s) for Trulicity and Humulin , Catana confirms patient has been approved as of 7/26 until 08/29/19. Faxed prescription portion of application into [facility].  Follow up:  Will follow up with [facility] in 3-5 business days to check prescription status  Maud Deed. Chana Bode [place] Certified Pharmacy Technician [place] Management Direct Dial:[phone]

## 2019-03-27 ENCOUNTER — Other Ambulatory Visit: Payer: Self-pay | Admitting: Pharmacy Technician

## 2019-03-27 NOTE — Patient Outreach (Addendum)
[place] [facility]) Care Management  03/27/2019  [name] Nov 22, 1950 [id number]    Follow up call placed to [facility]) regarding patient assistance application(s) for Trulicity and Humulin , Theadora Rama confirms that both medications are ready to be shipped to patient.    Successful call placed to patient regarding patient assistance update for Trulicity and Humulin, HIPAA identifiers verified. Informed [name] of above details, requested that [name] have 1 of his daughters contact me so that I can provide them with [facility] contact information so that shipment of medication can be set up.  Follow up:  Will follow up with patient in 2-3 business days if have not received call from daughter.  Maud Deed Chana Bode [place] Certified Pharmacy Technician [place] Management Direct [phone]   ADDENDUM 2:16pm  Return call from patients daughter [name], [place] identifiers verified. Provided Nikki with contact information for [employer] so that she can call to set up dad's shipment of medications.  Will follow up with patient in 3-5 business days to confirm shipment has been set up.  Maud Deed Chana Bode [place] Certified Pharmacy Technician [place] Management Direct [phone]

## 2019-04-01 ENCOUNTER — Other Ambulatory Visit: Payer: Self-pay | Admitting: Pharmacy Technician

## 2019-04-01 NOTE — Patient Outreach (Signed)
[place] [facility]) Care Management  04/01/2019  AMMIEL GUINEY 04-07-1951 [id number]    Successful call placed to patient regarding patient assistance medication delivery of Trulicity and Humulin 03/00, HIPAA identifiers verified. Mr. Staver confirms his daughter was able to set up for medication to be delivered tomorrow. Requested that he contact me if he runs into any issues.  Follow up:  Will route note to [place] for case closure  Maud Deed. Chana Bode [place] Certified Pharmacy Technician [place] Management Direct Dial:[phone]

## 2019-04-02 ENCOUNTER — Other Ambulatory Visit: Payer: Self-pay | Admitting: Pharmacy Technician

## 2019-04-02 DIAGNOSIS — H35372 Puckering of macula, left eye: Secondary | ICD-10-CM | POA: Diagnosis not present

## 2019-04-02 DIAGNOSIS — H34812 Central retinal vein occlusion, left eye, with macular edema: Secondary | ICD-10-CM | POA: Diagnosis not present

## 2019-04-02 NOTE — Patient Outreach (Signed)
[place] [facility]) Care Management  04/02/2019  [name] 1950-11-09 [id number]   Incoming voicemail from patient stating he received his Trulicity today from [facility] [employer]) but not his Humulin.  Outreach call made to [facility], Collie Siad states that Humulin was scheduled to be delivered today as well and is not sure why it wasn't. She resubmitted order and states that she would outreach patient when she confirms shipment.  Return call to patient, HIPAA identifiers verified. [name] in the cooler box again and states that Humulin 70/30 was not in box. Informed him of above details. Informed him that if he has not heard from [facility] by Thursday that he should have his daughter contact the company (with number I had provided to her for [employer])  again due to me not being available for the remainder of the week. He stated he would do so if need be.  Requested that he contact Monday if issue has not been resolved  Maud Deed. Chana Bode [place] Certified Pharmacy Technician [place] Management Direct Dial:[phone]

## 2019-04-03 ENCOUNTER — Other Ambulatory Visit: Payer: Self-pay | Admitting: Pharmacist

## 2019-04-03 NOTE — Patient Outreach (Signed)
[place]) Care Management [place]  04/03/2019  DAWUD MAYS [birth year] [id number]  Patient has been approved for Humulin 99/77 and Trulicity patient assistance program(s).  Patient has been instructed on how to order refills and renewal process for 2020.    Plan: [place] case is being closed due to the following reasons: -Goals of care have been met. -I have provided my contact information if patient or family needs to reach out to me in the future.  -Thank you for allowing [facility] pharmacy to be involved in this patient's care.    Ralene Bathe, PharmD, [place] [phone]

## 2019-05-29 DIAGNOSIS — H34812 Central retinal vein occlusion, left eye, with macular edema: Secondary | ICD-10-CM | POA: Diagnosis not present

## 2019-05-29 DIAGNOSIS — E113393 Type 2 diabetes mellitus with moderate nonproliferative diabetic retinopathy without macular edema, bilateral: Secondary | ICD-10-CM | POA: Diagnosis not present

## 2019-05-29 DIAGNOSIS — H3582 Retinal ischemia: Secondary | ICD-10-CM | POA: Diagnosis not present

## 2019-05-29 DIAGNOSIS — H35372 Puckering of macula, left eye: Secondary | ICD-10-CM | POA: Diagnosis not present

## 2019-06-13 ENCOUNTER — Telehealth: Payer: Self-pay | Admitting: Family Medicine

## 2019-06-13 DIAGNOSIS — E1339 Other specified diabetes mellitus with other diabetic ophthalmic complication: Secondary | ICD-10-CM

## 2019-06-13 DIAGNOSIS — IMO0002 Reserved for concepts with insufficient information to code with codable children: Secondary | ICD-10-CM

## 2019-06-13 NOTE — Telephone Encounter (Signed)
-----   Message from Ellamae Sia sent at 06/11/2019  3:09 PM EDT ----- Regarding: Lab orders for Friday, 10.23.20 Lab orders for a 3 month follow up appt.

## 2019-06-21 ENCOUNTER — Other Ambulatory Visit (INDEPENDENT_AMBULATORY_CARE_PROVIDER_SITE_OTHER): Payer: HMO

## 2019-06-21 ENCOUNTER — Other Ambulatory Visit: Payer: Self-pay

## 2019-06-21 DIAGNOSIS — E1339 Other specified diabetes mellitus with other diabetic ophthalmic complication: Secondary | ICD-10-CM

## 2019-06-21 DIAGNOSIS — E1365 Other specified diabetes mellitus with hyperglycemia: Secondary | ICD-10-CM

## 2019-06-21 DIAGNOSIS — IMO0002 Reserved for concepts with insufficient information to code with codable children: Secondary | ICD-10-CM

## 2019-06-21 LAB — LIPID PANEL
Cholesterol: 200 mg/dL (ref 0–200)
HDL: 35.7 mg/dL — ABNORMAL LOW (ref >39.00)
LDL Cholesterol: 133 mg/dL — ABNORMAL HIGH (ref 0–99)
NonHDL: 164
Total CHOL/HDL Ratio: 6
Triglycerides: 153 mg/dL — ABNORMAL HIGH (ref 0.0–149.0)
VLDL: 30.6 mg/dL (ref 0.0–40.0)

## 2019-06-21 LAB — COMPREHENSIVE METABOLIC PANEL
ALT: 21 U/L (ref 0–53)
AST: 18 U/L (ref 0–37)
Albumin: 4 g/dL (ref 3.5–5.2)
Alkaline Phosphatase: 86 U/L (ref 39–117)
BUN: 22 mg/dL (ref 6–23)
CO2: 30 mEq/L (ref 19–32)
Calcium: 9.2 mg/dL (ref 8.4–10.5)
Chloride: 101 mEq/L (ref 96–112)
Creatinine, Ser: 1.03 mg/dL (ref 0.40–1.50)
GFR: 71.47 mL/min (ref >60.00)
Glucose, Bld: 232 mg/dL — ABNORMAL HIGH (ref 70–99)
Potassium: 4.1 mEq/L (ref 3.5–5.1)
Sodium: 140 mEq/L (ref 135–145)
Total Bilirubin: 0.5 mg/dL (ref 0.2–1.2)
Total Protein: 7 g/dL (ref 6.0–8.3)

## 2019-06-21 LAB — HEMOGLOBIN A1C: Hgb A1c MFr Bld: 10.5 % — ABNORMAL HIGH (ref 4.6–6.5)

## 2019-06-21 NOTE — Progress Notes (Signed)
No critical labs need to be addressed urgently. We will discuss labs in detail at upcoming office visit.   

## 2019-06-25 ENCOUNTER — Ambulatory Visit: Payer: HMO | Admitting: Family Medicine

## 2019-06-25 ENCOUNTER — Telehealth: Payer: Self-pay

## 2019-06-25 NOTE — Telephone Encounter (Signed)
[place] Night - Client Nonclinical Telephone Record AccessNurse Client [place] Night - Client Client Site [place] Physician [name] - MD Contact Type Call Who Is Calling Patient / Member / Family / Caregiver Caller Name Melvyn Neth Caller Phone Number [phone] Patient Name [name] Patient DOB 09/18/2050 Call Type Message Only Information Provided Reason for Call Request to [place] Appointment Initial Comment Cancel her fathers appt. for today, is running a fever Additional Comment Pls call back to reschedule. Call Closed By: [name] Transaction Date/Time: 06/25/2019 7:51:33 AM (ET)

## 2019-06-25 NOTE — Telephone Encounter (Signed)
Noted  

## 2019-06-25 NOTE — Telephone Encounter (Signed)
R/s appointment 11/10

## 2019-07-09 ENCOUNTER — Ambulatory Visit (INDEPENDENT_AMBULATORY_CARE_PROVIDER_SITE_OTHER): Payer: HMO | Admitting: Family Medicine

## 2019-07-09 ENCOUNTER — Encounter: Payer: Self-pay | Admitting: Family Medicine

## 2019-07-09 ENCOUNTER — Other Ambulatory Visit: Payer: Self-pay

## 2019-07-09 VITALS — BP 146/82 | HR 69 | Temp 97.7°F | Ht 66.5 in | Wt 292.5 lb

## 2019-07-09 DIAGNOSIS — I10 Essential (primary) hypertension: Secondary | ICD-10-CM | POA: Diagnosis not present

## 2019-07-09 DIAGNOSIS — IMO0002 Reserved for concepts with insufficient information to code with codable children: Secondary | ICD-10-CM

## 2019-07-09 DIAGNOSIS — R21 Rash and other nonspecific skin eruption: Secondary | ICD-10-CM

## 2019-07-09 DIAGNOSIS — E785 Hyperlipidemia, unspecified: Secondary | ICD-10-CM

## 2019-07-09 DIAGNOSIS — E1339 Other specified diabetes mellitus with other diabetic ophthalmic complication: Secondary | ICD-10-CM | POA: Diagnosis not present

## 2019-07-09 DIAGNOSIS — E1365 Other specified diabetes mellitus with hyperglycemia: Secondary | ICD-10-CM

## 2019-07-09 MED ORDER — FREESTYLE LIBRE 14 DAY SENSOR MISC: 1.0000 | 0 refills | Status: DC

## 2019-07-09 MED ORDER — FREESTYLE LIBRE 14 DAY READER DEVI: 1.0000 | 0 refills | Status: DC

## 2019-07-09 NOTE — Progress Notes (Signed)
Chief Complaint  Patient presents with  . Follow-up    3 months    History of Present Illness: HPI   68 year old male pt presents for 3 month follow up of DM  Diabetes:   He is taking  60 units and 40 Units at night of NPH and taking the Trulicity weekly. Lab Results  Component Value Date   HGBA1C 10.5 (H) 06/21/2019  He has been eating more sweets. Using medications without difficulties: Hypoglycemic episodes: Hyperglycemic episodes: Feet problems:none Blood Sugars averaging: not checking meter not working right. Hard to see machine and to get it to work right. Eye exam within last year:    Daughter Lexine Baton is helping him out.  Hypertension:    Not at goal on  Current regimen BP Readings from Last 3 Encounters:  07/09/19 (!) 146/82  03/19/19 130/66  10/18/18 128/68  Using medication without problems or lightheadedness: none Chest pain with exertion:none Edema:none Short of breath:none Average home BPs: Other issues:  Elevated Cholesterol: CAD LDL goal < 70.. no longer at goal on crestor and zetia Using medications without problems: Muscle aches:  Diet compliance: poor Exercise: none Other complaints:   COVID 19 screen No recent travel or known exposure to COVID19 The patient denies respiratory symptoms of COVID 19 at this time.  The importance of social distancing was discussed today.   Review of Systems  Constitutional: Negative for chills and fever.  HENT: Negative for congestion and ear pain.   Eyes: Negative for pain and redness.  Respiratory: Negative for cough and shortness of breath.   Cardiovascular: Negative for chest pain, palpitations and leg swelling.  Gastrointestinal: Negative for abdominal pain, blood in stool, constipation, diarrhea, nausea and vomiting.  Genitourinary: Negative for dysuria.  Musculoskeletal: Negative for falls and myalgias.  Skin: Negative for rash.  Neurological: Negative for dizziness.  Psychiatric/Behavioral: Negative  for depression. The patient is not nervous/anxious.       Past Medical History:  Diagnosis Date  . Back injury 02/2002   worker's comp  . CHF (congestive heart failure) ([place])   . Coronary artery disease, non-occlusive    a. cath 2002 with no sig CAD;  b. cath 2008 normal LM, LAD, LCx, p&dRCA 20-30%, PDA 30%; c.11/2015 NSTEMI/PCI: LM nl, LAD 95p (2.5x15 Xience DES), LCX nl, RCA 100p/m w/ L->R collats, EF 55-65% c. NSTEMI (02/2016) with no culprit leision, switched to Brilinta.  d. NSTEMI 03/2016: again, no culprit lesion and switched back to plavix 2/2 SOB with Brilnta.    . Depression   . Diabetes mellitus type 2, insulin dependent ([place])   . Hyperlipemia   . Hypertensive heart disease   . Kidney stones   . Morbid obesity ([place])   . Osteoarthritis   . Snoring     reports that he has never smoked. He has never used smokeless tobacco. He reports that he does not drink alcohol or use drugs.   Current Outpatient Medications:  .  aspirin 81 MG EC tablet, Take 1 tablet (81 mg total) by mouth daily., Disp: , Rfl:  .  Blood Glucose Calibration (ONETOUCH VERIO) High SOLN, , Disp: , Rfl:  .  Blood Glucose Monitoring Suppl (ONETOUCH VERIO FLEX SYSTEM) w/Device KIT, , Disp: , Rfl:  .  carvedilol (COREG) 6.25 MG tablet, Take 1 tablet (6.25 mg total) by mouth daily. ONCE A DAY, Disp: 90 tablet, Rfl: 3 .  clopidogrel (PLAVIX) 75 MG tablet, TAKE 1 TABLET BY MOUTH ONCE DAILY WITH BREAKFAST, Disp:  90 tablet, Rfl: 1 .  Dulaglutide (TRULICITY) 5.27 PO/2.4MP SOPN, Inject 0.5 mLs into the skin once a week., Disp: 3 mL, Rfl: 11 .  ezetimibe (ZETIA) 10 MG tablet, TAKE 1 TABLET BY MOUTH ONCE DAILY, Disp: 90 tablet, Rfl: 3 .  furosemide (LASIX) 40 MG tablet, Take 1 tablet (40 mg total) by mouth 2 (two) times daily., Disp: 180 tablet, Rfl: 1 .  insulin NPH-regular Human (NOVOLIN 70/30) (70-30) 100 UNIT/ML injection, Inject 70 units in the morning and 40 units at bedtime., Disp: 50 mL, Rfl: 5 .  isosorbide mononitrate  (IMDUR) 30 MG 24 hr tablet, TAKE 1 TABLET BY MOUTH TWICE A DAY, Disp: 180 tablet, Rfl: 3 .  Lancets (ONETOUCH DELICA PLUS NTIRWE31V) MISC, , Disp: , Rfl:  .  losartan (COZAAR) 50 MG tablet, Take 1 tablet (50 mg total) by mouth daily., Disp: 90 tablet, Rfl: 1 .  nitroGLYCERIN (NITROSTAT) 0.4 MG SL tablet, DISSOLVE 1 TABLET UNDER TONGUE AS NEEDEDFOR CHEST PAIN. MAY REPEAT 5 MINUTES APART 3 TIMES IF NEEDED, Disp: 25 tablet, Rfl: 3 .  ONETOUCH VERIO test strip, , Disp: , Rfl:  .  rosuvastatin (CRESTOR) 40 MG tablet, Take 1 tablet (40 mg total) by mouth daily., Disp: 90 tablet, Rfl: 3 .  venlafaxine XR (EFFEXOR-XR) 150 MG 24 hr capsule, Take 1 capsule (150 mg total) by mouth daily with breakfast. (Patient taking differently: Take 150 mg by mouth daily with breakfast. Take with Effexor-XR 25m for a total dose of 2251m, Disp: 90 capsule, Rfl: 1 .  venlafaxine XR (EFFEXOR-XR) 75 MG 24 hr capsule, Take 1 capsule (75 mg total) by mouth daily. in addition to the 150 mg XL already taking to get to a total dose of 225 mg daily., Disp: 90 capsule, Rfl: 1 .  potassium chloride SA (K-DUR) 20 MEQ tablet, Take 1 tablet (20 mEq total) by mouth daily. (Patient not taking: Reported on 07/09/2019), Disp: 90 tablet, Rfl: 1   Observations/Objective: Blood pressure (!) 146/82, pulse 69, temperature 97.7 F (36.5 C), temperature source Temporal, height 5' 6.5" (1.689 m), weight 292 lb 8 oz (132.7 kg), SpO2 98 %.  Physical Exam Constitutional:      Appearance: He is well-developed.  HENT:     Head: Normocephalic.     Right Ear: Hearing normal.     Left Ear: Hearing normal.     Nose: Nose normal.  Neck:     Thyroid: No thyroid mass or thyromegaly.     Vascular: No carotid bruit.     Trachea: Trachea normal.  Cardiovascular:     Rate and Rhythm: Normal rate and regular rhythm.     Pulses: Normal pulses.     Heart sounds: Heart sounds not distant. No murmur. No friction rub. No gallop.      Comments: No peripheral  edema Pulmonary:     Effort: Pulmonary effort is normal. No respiratory distress.     Breath sounds: Normal breath sounds.  Skin:    General: Skin is warm and dry.     Findings: No rash.  Psychiatric:        Speech: Speech normal.        Behavior: Behavior normal.        Thought Content: Thought content normal.      Assessment and Plan   Hyperlipidemia LDL goal <70  Cholesterol: CAD LDL goal < 70.. no longer at goal on crestor and zetia   Essential hypertension, benign Not at goal on current regimen.  Plan more aggressive diet and weight loss efforts.  DM (diabetes mellitus), secondary, uncontrolled, w/eye complications ([place]) We will try to see in freestyle meter is covered.. contact [facility] for assistance.  Decrease the sweets in diet.  Watch portion size.  Increase walking as able.  Plan increase in trulicity once ready for next refill.    [name], MD

## 2019-07-09 NOTE — Patient Instructions (Addendum)
We will try to see in freestyle meter is covered.. contact [facility] for assistance.  Decrease the sweets in diet.  Watch portion size.  Increase walking as able.  When you are almost out of trulicity.Marland Kitchen we will increase to the highest dose.

## 2019-07-18 ENCOUNTER — Other Ambulatory Visit: Payer: Self-pay | Admitting: Family Medicine

## 2019-07-18 DIAGNOSIS — M8580 Other specified disorders of bone density and structure, unspecified site: Secondary | ICD-10-CM

## 2019-07-22 DIAGNOSIS — B359 Dermatophytosis, unspecified: Secondary | ICD-10-CM | POA: Diagnosis not present

## 2019-07-24 ENCOUNTER — Other Ambulatory Visit: Payer: Self-pay | Admitting: Family Medicine

## 2019-08-01 ENCOUNTER — Ambulatory Visit: Payer: Self-pay

## 2019-08-07 DIAGNOSIS — H34812 Central retinal vein occlusion, left eye, with macular edema: Secondary | ICD-10-CM | POA: Diagnosis not present

## 2019-08-07 DIAGNOSIS — E113393 Type 2 diabetes mellitus with moderate nonproliferative diabetic retinopathy without macular edema, bilateral: Secondary | ICD-10-CM | POA: Diagnosis not present

## 2019-08-07 DIAGNOSIS — H348112 Central retinal vein occlusion, right eye, stable: Secondary | ICD-10-CM | POA: Diagnosis not present

## 2019-08-07 DIAGNOSIS — H35372 Puckering of macula, left eye: Secondary | ICD-10-CM | POA: Diagnosis not present

## 2019-08-09 ENCOUNTER — Other Ambulatory Visit: Payer: Self-pay

## 2019-08-09 NOTE — Patient Outreach (Signed)
[facility]) Care Management Chronic Special Needs Program  08/09/2019  Name: [name] DOB: Feb 04, 1951  MRN: [id number]  Mr. Minas Bonser is enrolled in a chronic special needs plan for Heart Failure. Reviewed and updated care plan.  Subjective: client reports he is doing better. He reports he has seen cardiologist and denies any issues at this time. Client reports he takes his medications as prescribed. He states he does not weigh daily, but checks his weight at least three times/week. Client states his weight ranges between 285-290. Client denies shortness of breath or edema. He denies any issue related to heart failure at this time. Client reports, "I am watching what I eat and all."   He reports A1C was up when last checked because he has been eating a lot of "sweets".  A1C 10/5 on 06/21/2019. However he reports he is staying away from sweets. " don't even buy anything sweet no more". I'm watching what I eat". Client reports blood sugar 135 today and states he expects his A1C to be lower at his next check.    Goals Addressed            This Visit's Progress   . COMPLETED: Client understands the importance of follow-up with providers by attending scheduled visits       Voiced importance of attending provider visits.    . COMPLETED: Client verbalize knowledge of Heart Failure disease self management skills within 6 months.       Voiced taking medications; attending provider visits; reports weighs self about three times/week; signs/symptoms of exacerbation: increased shortness of breath, increased swelling or edema; increased fatigue; monitoring salt intake and fluid intake.    . COMPLETED: Client verbalizes knowledge of Heart Attack self management skills within the next 6 months.       Voiced takes medications as prescribed; attends provider visits as scheduled    . Client will report weighing and recording weights daily within 6 months.   On track   . Client  will verbalize knowledge of diabetes self-management as evidenced by Hgb A1C <7 or as defined by provider.   On track    Diabetes self management actions:  Glucose monitoring per provider recommendations  Eat Healthy  Check feet daily  Visit provider every 3-6 months as directed  Hbg A1C level every 3-6 months.   Eye Exam yearly    . COMPLETED: Client will verbalize knowledge of self management of Hypertension as evidences by BP reading of 140/90 or less; or as defined by provider       Reports takes medications as prescribed, attends provider visits as prescribed, watches salt intake.    . COMPLETED: Increase physical activity       Starting 02/21/17, I will attempt to walk at least 15 min daily.  Reports walks at least 15 minutes "every day".    . COMPLETED: Maintain timely refills of Heart Failure medication as prescribed within the year        Denies difficulty with obtaining medications.    . COMPLETED: Obtain annual  Lipid Profile, LDL-C       Done 06/21/2019    . COMPLETED: Patient Stated       States is taking medications as prescribed.    . COMPLETED: Visit Primary Care Provider or Cardiologist at least 2 times per year       Reports has seen provider at least twice this year.      RNCM reviewed signs/symptoms of heart failure  exacerbation; discussed the role of salt in hear failure and other heart conditions. Discussed foods to avoid that is high in salt. Discussed how to read the label to determine about how much salt is in what he is eating. RNCM also discussed importance of monitoring fluid intake.   Covid 19 precautions discussed; RNCM encouraged client to call the 24 hour nurse advice line as needed; RNCM reinforced the availability of the health care concierge for benefits questions and encouraged client to call RNCM as needed.  Plan: RNCM will send updated care plan to client; send updated care plan to primary care. RNCM will outreach within the next 6 months  or sooner as indicated. RNCM will send client sodium/how to read a label handout.  Thea Silversmith, RN, MSN, [place] [phone]       .

## 2019-08-15 ENCOUNTER — Telehealth: Payer: Self-pay | Admitting: Family Medicine

## 2019-08-15 NOTE — Telephone Encounter (Signed)
[name] with [employer] called in regards to a form they faxed over to the office yesterday (12/16)  She stated that this is for the patient to have Cardiac Genic testing done  She stated that if you did not receive the fax it there is a web site that our office can go to in order to sign for the patient to have this done.  [email or web address]  CODEMarlis [name]    PHONE- [phone]

## 2019-08-15 NOTE — Telephone Encounter (Signed)
Spoke with Mr. Osten.   He states this company called him and he does not want to do the Cardiac Genetic Testing.  Form faxed back to [employer] denying their request.  FYI to Dr. Diona Browner.

## 2019-08-19 ENCOUNTER — Other Ambulatory Visit: Payer: Self-pay | Admitting: *Deleted

## 2019-08-19 MED ORDER — FREESTYLE LIBRE 14 DAY SENSOR MISC: 1.0000 | 5 refills | Status: DC

## 2019-08-19 NOTE — Assessment & Plan Note (Signed)
Cholesterol: CAD LDL goal < 70.. no longer at goal on crestor and zetia

## 2019-08-19 NOTE — Assessment & Plan Note (Addendum)
Not at goal on current regimen. Plan more aggressive diet and weight loss efforts.

## 2019-08-19 NOTE — Assessment & Plan Note (Signed)
We will try to see in freestyle meter is covered.. contact [facility] for assistance.  Decrease the sweets in diet.  Watch portion size.  Increase walking as able.  Plan increase in trulicity once ready for next refill.

## 2019-10-08 ENCOUNTER — Telehealth: Payer: Self-pay | Admitting: Family Medicine

## 2019-10-08 NOTE — Chronic Care Management (AMB) (Signed)
  Chronic Care Management   Note  10/08/2019 Name: ESA RADEN MRN: [id number] DOB: April 30, 1951  [name] is a 69 y.o. year old male who is a primary care patient of Bedsole, Amy E, MD. I reached out to [name] by phone today in response to a referral sent by Mr. Desiree Lucy Sharpnack's PCP, Jinny Sanders, MD.   Mr. Pinson was given information about Chronic Care Management services today including:  1. CCM service includes personalized support from designated clinical staff supervised by his physician, including individualized plan of care and coordination with other care providers 2. 24/7 contact phone numbers for assistance for urgent and routine care needs. 3. Service will only be billed when office clinical staff spend 20 minutes or more in a month to coordinate care. 4. Only one practitioner may furnish and bill the service in a calendar month. 5. The patient may stop CCM services at any time (effective at the end of the month) by phone call to the office staff. 6. The patient will be responsible for cost sharing (co-pay) of up to 20% of the service fee (after annual deductible is met).  Patient agreed to services and verbal consent obtained.   Follow up plan:   Raynicia Dukes [employer] Scheduler

## 2019-10-09 ENCOUNTER — Telehealth: Payer: Self-pay

## 2019-10-09 ENCOUNTER — Other Ambulatory Visit: Payer: Self-pay

## 2019-10-09 ENCOUNTER — Ambulatory Visit: Payer: HMO

## 2019-10-09 ENCOUNTER — Other Ambulatory Visit: Payer: Self-pay | Admitting: Family Medicine

## 2019-10-09 ENCOUNTER — Encounter (INDEPENDENT_AMBULATORY_CARE_PROVIDER_SITE_OTHER): Payer: Self-pay

## 2019-10-09 DIAGNOSIS — I25118 Atherosclerotic heart disease of native coronary artery with other forms of angina pectoris: Secondary | ICD-10-CM

## 2019-10-09 DIAGNOSIS — I5032 Chronic diastolic (congestive) heart failure: Secondary | ICD-10-CM

## 2019-10-09 DIAGNOSIS — F331 Major depressive disorder, recurrent, moderate: Secondary | ICD-10-CM

## 2019-10-09 DIAGNOSIS — IMO0002 Reserved for concepts with insufficient information to code with codable children: Secondary | ICD-10-CM

## 2019-10-09 DIAGNOSIS — E785 Hyperlipidemia, unspecified: Secondary | ICD-10-CM

## 2019-10-09 DIAGNOSIS — I10 Essential (primary) hypertension: Secondary | ICD-10-CM

## 2019-10-09 DIAGNOSIS — E1339 Other specified diabetes mellitus with other diabetic ophthalmic complication: Secondary | ICD-10-CM

## 2019-10-09 DIAGNOSIS — K219 Gastro-esophageal reflux disease without esophagitis: Secondary | ICD-10-CM

## 2019-10-09 DIAGNOSIS — E113393 Type 2 diabetes mellitus with moderate nonproliferative diabetic retinopathy without macular edema, bilateral: Secondary | ICD-10-CM

## 2019-10-09 NOTE — Telephone Encounter (Signed)
I would like to request a referral for [name] to chronic care management pharmacy services focusing on the following conditions:   Essential hypertension, benign  [I10]  Coronary artery disease of native artery of native heart with stable angina pectoris (HCC) [I25.118]  DM (diabetes mellitus), secondary, uncontrolled, w/eye complications (HCC) [E13.39, E13.65]  Hyperlipidemia LDL goal <70  [E78.5]  Thank you,  [name], PharmD Clinical Pharmacist [facility] [phone]

## 2019-10-09 NOTE — Patient Instructions (Addendum)
Dear [name],  It was a pleasure meeting you during our initial appointment on October 09, 2019. Below is a summary of the goals we discussed and components of chronic care management. Please contact me anytime with questions or concerns.  I have attached a handout on diabetes and nutrition for your information.   Visit Information  Goals Addressed            This Visit's Progress   . Pharmacy Care Plan       Current Barriers:  . Chronic Disease Management support, education, and care coordination needs related to: hypertension, hyperlipidemia, congestive heart failure, coronary artery disease, GERD, diabetes, diabetic retinopathy, osteoarthritis, major depressive disorder  Pharmacist Clinical Goal(s):  Marland Kitchen Maintain blood pressure within goal of < 140/90 mmHg . Use Freestyle Libre without errors . Improve diabetes control with goal of A1c < 7% . Patient will check weight daily, if weight gain of more than 3 pounds in one day or 5 pounds in one week, call your doctor  . Achieve cholesterol goal of LDL < 70 mg/dL  Interventions: . Comprehensive medication review performed. . Coordinate adherence packaging and medication delivery . Resume rosuvastatin 40 mg once daily  Patient Self Care Activities:  . Self-administers medications as prescribed . Self monitors vital signs, including blood pressure, heart rate, blood glucose  Initial goal documentation         [name] was given information about Chronic Care Management services today including:  1. CCM service includes personalized support from designated clinical staff supervised by his physician, including individualized plan of care and coordination with other care providers 2. 24/7 contact phone numbers for assistance for urgent and routine care needs. 3. Service will only be billed when office clinical staff spend 20 minutes or more in a month to coordinate care. 4. Only one practitioner may furnish and bill the  service in a calendar month. 5. The patient may stop CCM services at any time (effective at the end of the month) by phone call to the office staff. 6. The patient will be responsible for cost sharing (co-pay) of up to 20% of the service fee (after annual deductible is met).  Patient agreed to services and verbal consent obtained.   Face to Face appointment with pharmacist scheduled for:  Tuesday, March 2nd, 11:30 AM  Please bring your medications, freestyle libre kit used to check your sugars, and your home blood pressure monitor (if you have one) to this visit.   Debbora Dus, PharmD Clinical Pharmacist [facility] [phone]  . Diabetes Mellitus and Nutrition, Adult When you have diabetes (diabetes mellitus), it is very important to have healthy eating habits because your blood sugar (glucose) levels are greatly affected by what you eat and drink. Eating healthy foods in the appropriate amounts, at about the same times every day, can help you:  Control your blood glucose.  Lower your risk of heart disease.  Improve your blood pressure.  Reach or maintain a healthy weight. Every person with diabetes is different, and each person has different needs for a meal plan. Your health care provider may recommend that you work with a diet and nutrition specialist (dietitian) to make a meal plan that is best for you. Your meal plan may vary depending on factors such as:  The calories you need.  The medicines you take.  Your weight.  Your blood glucose, blood pressure, and cholesterol levels.  Your activity level.  Other health  conditions you have, such as heart or kidney disease. How do carbohydrates affect me? Carbohydrates, also called carbs, affect your blood glucose level more than any other type of food. Eating carbs naturally raises the amount of glucose in your blood. Carb counting is a method for keeping track of how many carbs you eat. Counting  carbs is important to keep your blood glucose at a healthy level, especially if you use insulin or take certain oral diabetes medicines. It is important to know how many carbs you can safely have in each meal. This is different for every person. Your dietitian can help you calculate how many carbs you should have at each meal and for each snack. Foods that contain carbs include:  Bread, cereal, rice, pasta, and crackers.  Potatoes and corn.  Peas, beans, and lentils.  Milk and yogurt.  Fruit and juice.  Desserts, such as cakes, cookies, ice cream, and candy. How does alcohol affect me? Alcohol can cause a sudden decrease in blood glucose (hypoglycemia), especially if you use insulin or take certain oral diabetes medicines. Hypoglycemia can be a life-threatening condition. Symptoms of hypoglycemia (sleepiness, dizziness, and confusion) are similar to symptoms of having too much alcohol. If your health care provider says that alcohol is safe for you, follow these guidelines:  Limit alcohol intake to no more than 1 drink per day for nonpregnant women and 2 drinks per day for men. One drink equals 12 oz of beer, 5 oz of wine, or 1 oz of hard liquor.  Do not drink on an empty stomach.  Keep yourself hydrated with water, diet soda, or unsweetened iced tea.  Keep in mind that regular soda, juice, and other mixers may contain a lot of sugar and must be counted as carbs. What are tips for following this plan?  Reading food labels  Start by checking the serving size on the "Nutrition Facts" label of packaged foods and drinks. The amount of calories, carbs, fats, and other nutrients listed on the label is based on one serving of the item. Many items contain more than one serving per package.  Check the total grams (g) of carbs in one serving. You can calculate the number of servings of carbs in one serving by dividing the total carbs by 15. For example, if a food has 30 g of total carbs, it  would be equal to 2 servings of carbs.  Check the number of grams (g) of saturated and trans fats in one serving. Choose foods that have low or no amount of these fats.  Check the number of milligrams (mg) of salt (sodium) in one serving. Most people should limit total sodium intake to less than 2,300 mg per day.  Always check the nutrition information of foods labeled as "low-fat" or "nonfat". These foods may be higher in added sugar or refined carbs and should be avoided.  Talk to your dietitian to identify your daily goals for nutrients listed on the label. Shopping  Avoid buying canned, premade, or processed foods. These foods tend to be high in fat, sodium, and added sugar.  Shop around the outside edge of the grocery store. This includes fresh fruits and vegetables, bulk grains, fresh meats, and fresh dairy. Cooking  Use low-heat cooking methods, such as baking, instead of high-heat cooking methods like deep frying.  Cook using healthy oils, such as olive, canola, or sunflower oil.  Avoid cooking with butter, cream, or high-fat meats. Meal planning  Eat meals and snacks regularly,  preferably at the same times every day. Avoid going long periods of time without eating.  Eat foods high in fiber, such as fresh fruits, vegetables, beans, and whole grains. Talk to your dietitian about how many servings of carbs you can eat at each meal.  Eat 4-6 ounces (oz) of lean protein each day, such as lean meat, chicken, fish, eggs, or tofu. One oz of lean protein is equal to: ? 1 oz of meat, chicken, or fish. ? 1 egg. ?  cup of tofu.  Eat some foods each day that contain healthy fats, such as avocado, nuts, seeds, and fish. Lifestyle  Check your blood glucose regularly.  Exercise regularly as told by your health care provider. This may include: ? 150 minutes of moderate-intensity or vigorous-intensity exercise each week. This could be brisk walking, biking, or water  aerobics. ? Stretching and doing strength exercises, such as yoga or weightlifting, at least 2 times a week.  Take medicines as told by your health care provider.  Do not use any products that contain nicotine or tobacco, such as cigarettes and e-cigarettes. If you need help quitting, ask your health care provider.  Work with a Social worker or diabetes educator to identify strategies to manage stress and any emotional and social challenges. Questions to ask a health care provider  Do I need to meet with a diabetes educator?  Do I need to meet with a dietitian?  What number can I call if I have questions?  When are the best times to check my blood glucose? Where to find more information:  American Diabetes Association: diabetes.org  Academy of Nutrition and Dietetics: www.eatright.[employer] of Diabetes and Digestive and Kidney Diseases (NIH): [employer] Summary  A healthy meal plan will help you control your blood glucose and maintain a healthy lifestyle.  Working with a diet and nutrition specialist (dietitian) can help you make a meal plan that is best for you.  Keep in mind that carbohydrates (carbs) and alcohol have immediate effects on your blood glucose levels. It is important to count carbs and to use alcohol carefully. This information is not intended to replace advice given to you by your health care provider. Make sure you discuss any questions you have with your health care provider. Document Revised: 07/28/2017 Document Reviewed: 09/19/2016 Elsevier Patient Education  2020 [employer].

## 2019-10-09 NOTE — Chronic Care Management (AMB) (Signed)
Chronic Care Management Pharmacy  Name: [name]  MRN: [id number] DOB: 05/08/1951  Chief Complaint/ HPI  [name],  69 y.o., male presents for their Initial CCM visit with the clinical pharmacist via telephone.  PCP : Jinny Sanders, MD   Specialists:  [facility], Cardiology  Their chronic conditions include: HTN, HLD, CHF (diastolic), CAD, GERD, DM, diabetic retinopathy, OA, MDD  Allergies/Intolerances atorvastatin (body aches)  Patient concerns:   Very low energy, tired - falls sleep anywhere from 11 PM-1AM,  wakes up 10 AM, sleeps well; has to sit down after walking to mailbox and back, gets tired, but not SOB; counseled on exercise and eating healthy for energy, patient has history of OSA per cardio 2017 ([place]) and refuses CPAP, this likely increases fatigue, may also improve with better DM control. Mood may also be contributing.  Freestyle libre - fell off when he took a bath, needs help keeping it on skin  Office Visits:  07/09/19, [facility] - DM, rash, BG meter not working correctly, ordered freestyle meter if covered, watch diet, increase walking, increase Trulicity once ready for next refill, dermatology referral  04/03/19, [facility] - patient approved for Humulin 20/35 and Trulicity PAP  5/97/41, [facility] - Donut hole with Trulicity, discontinued, applying for assistance  Consult Visit:  11/29/18, [facility] - h/o NSTEMI 2017 x 3, PCI x 2, no medication changes  Medications: Outpatient Encounter Medications as of 10/09/2019  Medication Sig Note  . aspirin 81 MG EC tablet Take 1 tablet (81 mg total) by mouth daily.   . Blood Glucose Calibration (ONETOUCH VERIO) High SOLN    . Blood Glucose Monitoring Suppl (ONETOUCH VERIO FLEX SYSTEM) w/Device KIT    . carvedilol (COREG) 6.25 MG tablet Take 1 tablet (6.25 mg total) by mouth daily. ONCE A DAY   . clopidogrel (PLAVIX) 75 MG tablet TAKE 1 TABLET BY MOUTH ONCE DAILY WITH BREAKFAST     . Continuous Blood Gluc Receiver (FREESTYLE LIBRE 14 DAY READER) DEVI 1 Device by Does not apply route every 14 (fourteen) days.   . Continuous Blood Gluc Sensor (FREESTYLE LIBRE 14 DAY SENSOR) MISC 1 Device by Does not apply route every 14 (fourteen) days.   . Dulaglutide (TRULICITY) 6.38 GT/3.6IW SOPN Inject 0.5 mLs into the skin once a week.   . ezetimibe (ZETIA) 10 MG tablet TAKE 1 TABLET BY MOUTH ONCE DAILY   . furosemide (LASIX) 40 MG tablet Take 1 tablet (40 mg total) by mouth 2 (two) times daily. 02/05/2019: Takes once a day.  . insulin NPH-regular Human (NOVOLIN 70/30) (70-30) 100 UNIT/ML injection Inject 70 units in the morning and 40 units at bedtime.   . isosorbide mononitrate (IMDUR) 30 MG 24 hr tablet TAKE 1 TABLET BY MOUTH TWICE A DAY   . Lancets (ONETOUCH DELICA PLUS OEHOZY24M) MISC    . losartan (COZAAR) 50 MG tablet Take 1 tablet (50 mg total) by mouth daily.   . nitroGLYCERIN (NITROSTAT) 0.4 MG SL tablet DISSOLVE 1 TABLET UNDER TONGUE AS NEEDEDFOR CHEST PAIN. MAY REPEAT 5 MINUTES APART 3 TIMES IF NEEDED   . ONETOUCH VERIO test strip    . potassium chloride SA (K-DUR) 20 MEQ tablet Take 1 tablet (20 mEq total) by mouth daily. (Patient not taking: Reported on 07/09/2019)   . rosuvastatin (CRESTOR) 40 MG tablet Take 1 tablet (40 mg total) by mouth daily.   Marland Kitchen venlafaxine XR (EFFEXOR-XR) 150 MG 24 hr capsule Take 1 capsule (150  mg total) by mouth daily with breakfast. Take with Effexor-XR 6m for a total dose of 2250m  . venlafaxine XR (EFFEXOR-XR) 75 MG 24 hr capsule TAKE 1 CAPSULE BY MOUTH ONCE DAILY. TAKEIN ADDITION TO THE 150 MG CAPSULE TO EQUAL A TOTAL DOSE OF 225 MG DAILY    No facility-administered encounter medications on file as of 10/09/2019.   Current Diagnosis/Assessment: Goals    . Client will report weighing and recording weights daily within 6 months.    . Client will verbalize knowledge of diabetes self-management as evidenced by Hgb A1C <7 or as defined by  provider.     Diabetes self management actions:  Glucose monitoring per provider recommendations  Eat Healthy  Check feet daily  Visit provider every 3-6 months as directed  Hbg A1C level every 3-6 months.   Eye Exam yearly    . Pharmacy Care Plan     Current Barriers:  . Chronic Disease Management support, education, and care coordination needs related to: hypertension, hyperlipidemia, congestive heart failure, coronary artery disease, GERD, diabetes, diabetic retinopathy, osteoarthritis, major depressive disorder  Pharmacist Clinical Goal(s):  . Marland Kitchenaintain blood pressure within goal of < 140/90 mmHg . Use Freestyle Libre without errors . Improve diabetes control with goal of A1c < 7% . Patient will check weight daily, if weight gain of more than 3 pounds in one day or 5 pounds in one week, call your doctor  . Achieve cholesterol goal of LDL < 70 mg/dL  Interventions: . Comprehensive medication review performed. . Coordinate adherence packaging and medication delivery . Resume rosuvastatin 40 mg once daily  Patient Self Care Activities:  . Self-administers medications as prescribed . Self monitors vital signs, including blood pressure, heart rate, blood glucose  Initial goal documentation         Diabetes  Recent Relevant Labs: Lab Results  Component Value Date/Time   HGBA1C 10.5 (H) 06/21/2019 07:57 AM   HGBA1C 9.1 (H) 03/12/2019 10:03 AM   HGBA1C 9.6 10/02/2018 12:00 AM   MICROALBUR <0.7 02/21/2017 01:57 PM   MICROALBUR 0.8 02/16/2016 08:24 AM    Checking BG: Last used Freestyle Libre 3 weeks ago; sensor fell off early and he didn't want to waste sensors; reports he washes skin first, this is the first time he has had an issue with falling off early Recent FBG Readings: none to report, unable to see numbers on liCalumetreports occasional hypoglycemia overnight if skipping bedtime snack Patient is currently uncontrolled on the following medications:   Dulaglutide  0.75 mg/0.5 mL - Inject once weekly  Novolin 70/30 - Inject 70 units in the morning and 40 units at bedtime (takes up to 15 minutes after meals)   Last diabetic eye exam:  Lab Results  Component Value Date/Time   HMDIABEYEEXA Retinopathy (A) 01/11/2017 12:00 AM    Last diabetic foot exam:  Lab Results  Component Value Date/Time   HMDIABFOOTEX done 03/19/2019 12:00 AM    We discussed: Tegaderm patches to cover sensors; patient will come in office on 10/29/19 for assessment of BG and libre use. Tolerating Trulicity well, has noticed reduced appetite, but hasn't noticed weight change; PCP already plans to increase Trulicity dose to 1.5 mg/dL. Pt will discuss with PCP at appt later this week.  Diet: trying to avoid sweets  Plan: Continue current medications; Discuss Trulicity dose increase with Dr. BeDiona Brownern 10/11/19. Patient will come into office on 10/29/19 for visit with pharmacist to explain freestyle libre and assess BG readings.  Instructed patient to restart using libre today.  Heart Failure  Managed by Dr. Rockey Situ Type: Diastolic Last ejection fraction: 55-60%  NYHA Class: III (marked limitation of activity) AHA HF Stage: C (Heart disease and symptoms present)  Patient has failed these meds in past: none  Patient is currently controlled on the following medications:   Furosemide 40 mg - twice daily (usually only takes once daily unless excess fluid)  We discussed weighing daily; if you gain more than 3 pounds in one day or 5 pounds in one week call your doctor  Plan: Continue current medications ,  Hypertension  Office blood pressures are  BP Readings from Last 3 Encounters:  07/09/19 (!) 146/82  03/19/19 130/66  10/18/18 128/68   BMP Latest Ref Rng & Units 06/21/2019 03/12/2019 10/18/2018  Glucose 70 - 99 mg/dL 232(H) 362(H) 197(H)  BUN 6 - 23 mg/dL 22 19 15   Creatinine 0.40 - 1.50 mg/dL 1.03 1.07 1.06  Sodium 135 - 145 mEq/L 140 139 139  Potassium 3.5 - 5.1 mEq/L 4.1 4.8  3.5  Chloride 96 - 112 mEq/L 101 102 103  CO2 19 - 32 mEq/L 30 29 29   Calcium 8.4 - 10.5 mg/dL 9.2 8.8 8.7   Patient is currently uncontrolled on the following medications:   Carvedilol 6.25 mg - once daily (Gollan)  Losartan 50 mg - once daily   Furosemide 40 mg - once daily (second dose is PRN)  Patient has failed these meds in the past: none reported Patient checks BP at home infrequently Patient home BP readings are ranging: none reported  Plan: Continue current medications. Patient to bring home monitor to clinic for assessment on 10/29/19.   Hyperlipidemia  Lipid Panel     Component Value Date/Time   CHOL 200 06/21/2019 0757   TRIG 153.0 (H) 06/21/2019 0757   HDL 35.70 (L) 06/21/2019 0757   CHOLHDL 6 06/21/2019 0757   VLDL 30.6 06/21/2019 0757   LDLCALC 133 (H) 06/21/2019 0757   LDLDIRECT 174.0 10/19/2017 1600  LDL goal < 70  Patient has failed these meds in past: atorvastatin Patient is currently uncontrolled on the following medications:   Rosuvastatin 40 mg - once daily  Ezetimibe 10 mg - once daily (Gollan)  We discussed:  Patient is not currently taking rosuvastatin, didn't realize he was no longer taking until he couldn't find it in his pillbox today. Seems patient was also not taking by accident prior to last lipid panel, per chart review.  Plan: Continue ezetimibe daily. Discussed with patient the need to resume rosuvastatin. Will ask Dr. Diona Browner for new refills. Please send rx to [facility].  CAD   CBC Latest Ref Rng & Units 10/18/2018 10/19/2017 09/21/2016  WBC 4.0 - 10.5 K/uL 7.3 7.2 5.8  Hemoglobin 13.0 - 17.0 g/dL 13.5 14.2 12.6(L)  Hematocrit 39.0 - 52.0 % 39.8 42.2 37.3(L)  Platelets 150.0 - 400.0 K/uL 218.0 234.0 203  NSTEMI 2017 x 3, PCI x 2 Patient has failed these meds in past: none Patient is currently controlled on the following medications:   Aspirin 81 mg - once daily  Isosorbide Mononitrate 30 mg - twice daily  (Gollan)  Nitroglycerin 0.4 mg SL - as needed  Plavix 75 mg - once daily with breakfast  We discussed: hasn't taken nitrogylcerin in 1-2 years  Plan: Continue current medications  MDD  Symptoms: patient still lacks desire to get out of the house, but reports doing much better with medication Patient has failed these  meds in past: none reported Patient is currently controlled on the following medications:   Venlafaxine XR 75 mg and 150 mg - take total dose of 225 mg once daily We discussed: encouraged family time, doing things he used to enjoy, 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 which keeps him busy  Plan: Continue current medications  Medication Management:  OTCs: no longer taking anything for acid reflux, taking vitamin D 1000 IU - 2 a day, Nepro drink 1 daily, tylenol PM PRN   [facility]   Adherence: Family fills pillbox once weekly; history of non-adherence due to cost; Takes morning medications around 11 AM (with breakfast- oatmeal, eggs, cornflakes, takes the rest at supper (evening meal); Thursdays - Trulicity  Cost concerns: PAP - Trulicity, Novolin 70, 40 units (currently free with insurance until he goes into donut hole; we will reapply for PAP prior to donut hole this year)   Exercise: limited due to fatigue, fear of falls  Verbal consent obtained for [facility] enhanced pharmacy services (medication synchronization, adherence packaging, delivery coordination). A medication sync plan was created to allow patient to get all medications delivered once every 30 to 90 days per patient preference. Patient understands they have freedom to choose pharmacy and clinical pharmacist will coordinate care between all prescribers and [facility].  CCM Follow Up: Tuesday, March 2nd, 11:30 AM face to face  Debbora Dus, PharmD Clinical Pharmacist [facility] at [facility] [phone]

## 2019-10-09 NOTE — Telephone Encounter (Signed)
Referral entered in Epic.  Will forward to Dr. Ermalene Searing to sign.

## 2019-10-10 ENCOUNTER — Other Ambulatory Visit: Payer: Self-pay

## 2019-10-10 MED ORDER — CARVEDILOL 6.25 MG PO TABS: 6.2500 mg | ORAL_TABLET | Freq: Every day | ORAL | 0 refills | Status: DC

## 2019-10-10 MED ORDER — EZETIMIBE 10 MG PO TABS: 10.0000 mg | ORAL_TABLET | Freq: Every day | ORAL | 0 refills | Status: DC

## 2019-10-10 MED ORDER — ISOSORBIDE MONONITRATE ER 30 MG PO TB24: 30.0000 mg | ORAL_TABLET | Freq: Two times a day (BID) | ORAL | 0 refills | Status: DC

## 2019-10-10 MED ORDER — ROSUVASTATIN CALCIUM 40 MG PO TABS: 40.0000 mg | ORAL_TABLET | Freq: Every day | ORAL | 0 refills | Status: DC

## 2019-10-10 NOTE — Telephone Encounter (Signed)
Requested Prescriptions   Signed Prescriptions Disp Refills  . rosuvastatin (CRESTOR) 40 MG tablet 90 tablet 0    Sig: Take 1 tablet (40 mg total) by mouth daily.    Authorizing Provider: Antonieta Iba    Ordering User: Margrett Rud carvedilol (COREG) 6.25 MG tablet 90 tablet 0    Sig: Take 1 tablet (6.25 mg total) by mouth daily.    Authorizing Provider: Antonieta Iba    Ordering User: Margrett Rud ezetimibe (ZETIA) 10 MG tablet 90 tablet 0    Sig: Take 1 tablet (10 mg total) by mouth daily.    Authorizing Provider: Antonieta Iba    Ordering User: Margrett Rud isosorbide mononitrate (IMDUR) 30 MG 24 hr tablet 180 tablet 0    Sig: Take 1 tablet (30 mg total) by mouth 2 (two) times daily.    Authorizing Provider: Antonieta Iba    Ordering User: Margrett Rud

## 2019-10-11 ENCOUNTER — Ambulatory Visit: Payer: HMO | Admitting: Family Medicine

## 2019-10-15 ENCOUNTER — Encounter: Payer: Self-pay | Admitting: Family Medicine

## 2019-10-15 ENCOUNTER — Ambulatory Visit (INDEPENDENT_AMBULATORY_CARE_PROVIDER_SITE_OTHER): Payer: HMO | Admitting: Family Medicine

## 2019-10-15 ENCOUNTER — Other Ambulatory Visit: Payer: Self-pay

## 2019-10-15 VITALS — BP 150/90 | HR 87 | Temp 97.3°F | Ht 66.5 in | Wt 294.5 lb

## 2019-10-15 DIAGNOSIS — E1365 Other specified diabetes mellitus with hyperglycemia: Secondary | ICD-10-CM

## 2019-10-15 DIAGNOSIS — E1339 Other specified diabetes mellitus with other diabetic ophthalmic complication: Secondary | ICD-10-CM | POA: Diagnosis not present

## 2019-10-15 DIAGNOSIS — I10 Essential (primary) hypertension: Secondary | ICD-10-CM | POA: Diagnosis not present

## 2019-10-15 DIAGNOSIS — Z9119 Patient's noncompliance with other medical treatment and regimen: Secondary | ICD-10-CM | POA: Diagnosis not present

## 2019-10-15 DIAGNOSIS — R7989 Other specified abnormal findings of blood chemistry: Secondary | ICD-10-CM | POA: Diagnosis not present

## 2019-10-15 DIAGNOSIS — Z91199 Patient's noncompliance with other medical treatment and regimen due to unspecified reason: Secondary | ICD-10-CM

## 2019-10-15 DIAGNOSIS — E785 Hyperlipidemia, unspecified: Secondary | ICD-10-CM | POA: Diagnosis not present

## 2019-10-15 DIAGNOSIS — E113393 Type 2 diabetes mellitus with moderate nonproliferative diabetic retinopathy without macular edema, bilateral: Secondary | ICD-10-CM

## 2019-10-15 DIAGNOSIS — IMO0002 Reserved for concepts with insufficient information to code with codable children: Secondary | ICD-10-CM

## 2019-10-15 LAB — POCT GLYCOSYLATED HEMOGLOBIN (HGB A1C): Hemoglobin A1C: 10.1 % — AB (ref 4.0–5.6)

## 2019-10-15 MED ORDER — TRULICITY 0.75 MG/0.5ML ~~LOC~~ SOAJ: 1.5000 mg | SUBCUTANEOUS | 11 refills | Status: DC

## 2019-10-15 MED ORDER — ROSUVASTATIN CALCIUM 40 MG PO TABS: 40.0000 mg | ORAL_TABLET | Freq: Every day | ORAL | 3 refills | Status: DC

## 2019-10-15 NOTE — Progress Notes (Signed)
Chief Complaint  Patient presents with  . Diabetes    History of Present Illness: HPI  Diabetes:  Minimal improvement in last 3 months. He plans on starting to use freestyle Elenor Legato He was out of trulicity for 1 month. No SE to trulicty  He is eating less and feeling more full. Lab Results  Component Value Date   HGBA1C 10.1 (A) 10/15/2019  Using medications without difficulties: Hypoglycemic episodes:none Hyperglycemic episodes: yes Feet problems: no ulcers Blood Sugars averaging: above goal eye exam within last year: due  Wt Readings from Last 3 Encounters:  10/15/19 294 lb 8 oz (133.6 kg)  07/09/19 292 lb 8 oz (132.7 kg)  03/19/19 289 lb 12 oz (131.4 kg)    He plans to move all meds to [facility].    Not checking BP at home  BP Readings from Last 3 Encounters:  10/15/19 (!) 150/90  07/09/19 (!) 146/82  03/19/19 130/66   Needs to restart crestor... per DERM.Marland Kitchen now no longer on that treatment  On Zetia.Marland Kitchen not at goal This visit occurred during the SARS-CoV-2 public health emergency.  Safety protocols were in place, including screening questions prior to the visit, additional usage of staff PPE, and extensive cleaning of exam room while observing appropriate contact time as indicated for disinfecting solutions.   COVID 19 screen:  No recent travel or known exposure to COVID19 The patient denies respiratory symptoms of COVID 19 at this time. The importance of social distancing was discussed today.     Review of Systems  Constitutional: Positive for malaise/fatigue. Negative for chills and fever.  HENT: Negative for congestion and ear pain.   Eyes: Negative for pain and redness.  Respiratory: Negative for cough and shortness of breath.   Cardiovascular: Negative for chest pain, palpitations and leg swelling.  Gastrointestinal: Negative for abdominal pain, blood in stool, constipation, diarrhea, nausea and vomiting.  Genitourinary: Negative for dysuria.   Musculoskeletal: Negative for falls and myalgias.  Skin: Negative for rash.  Neurological: Negative for dizziness.  Psychiatric/Behavioral: Negative for depression. The patient is not nervous/anxious.       Past Medical History:  Diagnosis Date  . Back injury 02/2002   worker's comp  . CHF (congestive heart failure) ([place])   . Coronary artery disease, non-occlusive    a. cath 2002 with no sig CAD;  b. cath 2008 normal LM, LAD, LCx, p&dRCA 20-30%, PDA 30%; c.11/2015 NSTEMI/PCI: LM nl, LAD 95p (2.5x15 Xience DES), LCX nl, RCA 100p/m w/ L->R collats, EF 55-65% c. NSTEMI (02/2016) with no culprit leision, switched to Brilinta.  d. NSTEMI 03/2016: again, no culprit lesion and switched back to plavix 2/2 SOB with Brilnta.    . Depression   . Diabetes mellitus type 2, insulin dependent ([place])   . Hyperlipemia   . Hypertensive heart disease   . Kidney stones   . Morbid obesity ([place])   . Osteoarthritis   . Snoring     reports that he has never smoked. He has never used smokeless tobacco. He reports that he does not drink alcohol or use drugs.   Current Outpatient Medications:  .  aspirin 81 MG EC tablet, Take 1 tablet (81 mg total) by mouth daily., Disp: , Rfl:  .  Blood Glucose Calibration (ONETOUCH VERIO) High SOLN, , Disp: , Rfl:  .  Blood Glucose Monitoring Suppl (ONETOUCH VERIO FLEX SYSTEM) w/Device KIT, , Disp: , Rfl:  .  carvedilol (COREG) 6.25 MG tablet, Take 1 tablet (6.25 mg total)  by mouth daily., Disp: 90 tablet, Rfl: 0 .  clopidogrel (PLAVIX) 75 MG tablet, TAKE 1 TABLET BY MOUTH ONCE DAILY WITH BREAKFAST, Disp: 90 tablet, Rfl: 1 .  Continuous Blood Gluc Receiver (FREESTYLE LIBRE 14 DAY READER) DEVI, 1 Device by Does not apply route every 14 (fourteen) days., Disp: 1 Device, Rfl: 0 .  Continuous Blood Gluc Sensor (FREESTYLE LIBRE 14 DAY SENSOR) MISC, 1 Device by Does not apply route every 14 (fourteen) days., Disp: 2 each, Rfl: 5 .  Dulaglutide (TRULICITY) 9.81 XB/1.4NW SOPN, Inject  0.5 mLs into the skin once a week., Disp: 3 mL, Rfl: 11 .  ezetimibe (ZETIA) 10 MG tablet, Take 1 tablet (10 mg total) by mouth daily., Disp: 90 tablet, Rfl: 0 .  furosemide (LASIX) 40 MG tablet, Take 1 tablet (40 mg total) by mouth 2 (two) times daily., Disp: 180 tablet, Rfl: 1 .  insulin NPH-regular Human (NOVOLIN 70/30) (70-30) 100 UNIT/ML injection, Inject 70 units in the morning and 40 units at bedtime., Disp: 50 mL, Rfl: 5 .  isosorbide mononitrate (IMDUR) 30 MG 24 hr tablet, Take 1 tablet (30 mg total) by mouth 2 (two) times daily., Disp: 180 tablet, Rfl: 0 .  Lancets (ONETOUCH DELICA PLUS [id number]) MISC, , Disp: , Rfl:  .  losartan (COZAAR) 50 MG tablet, TAKE 1 TABLET BY MOUTH ONCE DAILY, Disp: 90 tablet, Rfl: 1 .  nitroGLYCERIN (NITROSTAT) 0.4 MG SL tablet, DISSOLVE 1 TABLET UNDER TONGUE AS NEEDEDFOR CHEST PAIN. MAY REPEAT 5 MINUTES APART 3 TIMES IF NEEDED, Disp: 25 tablet, Rfl: 3 .  ONETOUCH VERIO test strip, , Disp: , Rfl:  .  potassium chloride SA (K-DUR) 20 MEQ tablet, Take 1 tablet (20 mEq total) by mouth daily., Disp: 90 tablet, Rfl: 1 .  rosuvastatin (CRESTOR) 40 MG tablet, Take 1 tablet (40 mg total) by mouth daily., Disp: 90 tablet, Rfl: 0 .  venlafaxine XR (EFFEXOR-XR) 150 MG 24 hr capsule, Take 1 capsule (150 mg total) by mouth daily with breakfast. Take with Effexor-XR 76m for a total dose of 2231m Disp: 90 capsule, Rfl: 1 .  venlafaxine XR (EFFEXOR-XR) 75 MG 24 hr capsule, TAKE 1 CAPSULE BY MOUTH ONCE DAILY. TAKEIN ADDITION TO THE 150 MG CAPSULE TO EQUAL A TOTAL DOSE OF 225 MG DAILY, Disp: 90 capsule, Rfl: 1   Observations/Objective: Blood pressure (!) 150/90, pulse 87, temperature (!) 97.3 F (36.3 C), temperature source Temporal, height 5' 6.5" (1.689 m), weight 294 lb 8 oz (133.6 kg), SpO2 96 %.  Physical Exam Constitutional:      Appearance: He is well-developed. He is obese.  HENT:     Head: Normocephalic.     Right Ear: Hearing normal.     Left Ear: Hearing  normal.     Nose: Nose normal.  Neck:     Thyroid: No thyroid mass or thyromegaly.     Vascular: No carotid bruit.     Trachea: Trachea normal.  Cardiovascular:     Rate and Rhythm: Normal rate and regular rhythm.     Pulses: Normal pulses.     Heart sounds: Heart sounds not distant. No murmur. No friction rub. No gallop.      Comments: No peripheral edema Pulmonary:     Effort: Pulmonary effort is normal. No respiratory distress.     Breath sounds: Normal breath sounds.  Skin:    General: Skin is warm and dry.     Findings: No rash.  Psychiatric:  Speech: Speech normal.        Behavior: Behavior normal.        Thought Content: Thought content normal.      Assessment and Plan  Morbid obesity ([place]) Encouraged exercise, weight loss, healthy eating habits.   Noncompliance with diabetes treatment  Currently seeing our pharmacist to assist with compliance issues as well as to assist with cost of medications.  Hyperlipidemia LDL goal <70 Restart crestor. Low cholesterol diet reviewed.  Essential hypertension, benign  Follow BP at home and call with measurements in 1-2 weeks.  Diabetic retinopathy Overdue for eye eval...encouraged pt to return to opthalmology.  DM (diabetes mellitus), secondary, uncontrolled, w/eye complications ([place]) Increase trulicity to 1.5 mg weekly. Continue work on [employer] and low carb diet.      [name], MD

## 2019-10-15 NOTE — Patient Instructions (Addendum)
Increase trulicity to 1.5 mg weekly.  Restart crestor daily now off fungal medication.  If possible get blood pressure cuff, check blood pressure at home.Marland Kitchen goal < 140/90. Bring measurements to next office visit.

## 2019-10-16 DIAGNOSIS — H34812 Central retinal vein occlusion, left eye, with macular edema: Secondary | ICD-10-CM | POA: Diagnosis not present

## 2019-10-16 DIAGNOSIS — H3582 Retinal ischemia: Secondary | ICD-10-CM | POA: Diagnosis not present

## 2019-10-16 DIAGNOSIS — E113393 Type 2 diabetes mellitus with moderate nonproliferative diabetic retinopathy without macular edema, bilateral: Secondary | ICD-10-CM | POA: Diagnosis not present

## 2019-10-16 DIAGNOSIS — H35372 Puckering of macula, left eye: Secondary | ICD-10-CM | POA: Diagnosis not present

## 2019-10-23 ENCOUNTER — Telehealth: Payer: Self-pay

## 2019-10-23 MED ORDER — CLOPIDOGREL BISULFATE 75 MG PO TABS: ORAL_TABLET | ORAL | 1 refills | Status: DC

## 2019-10-23 MED ORDER — FUROSEMIDE 40 MG PO TABS: 40.0000 mg | ORAL_TABLET | Freq: Every day | ORAL | 1 refills | Status: DC

## 2019-10-23 MED ORDER — VENLAFAXINE HCL ER 75 MG PO CP24: ORAL_CAPSULE | ORAL | 1 refills | Status: DC

## 2019-10-23 MED ORDER — LOSARTAN POTASSIUM 50 MG PO TABS: 50.0000 mg | ORAL_TABLET | Freq: Every day | ORAL | 1 refills | Status: DC

## 2019-10-23 MED ORDER — NOVOLIN 70/30 (70-30) 100 UNIT/ML ~~LOC~~ SUSP: SUBCUTANEOUS | 5 refills | Status: DC

## 2019-10-23 MED ORDER — VENLAFAXINE HCL ER 150 MG PO CP24: 150.0000 mg | ORAL_CAPSULE | Freq: Every day | ORAL | 1 refills | Status: DC

## 2019-10-23 NOTE — Telephone Encounter (Signed)
Refills sent as requested

## 2019-10-23 NOTE — Telephone Encounter (Signed)
[name] is transferring his preferred pharmacy to [facility]. The following medication refills are needed:    Novolin 70/30 - 70 units AM, 40 units PM   Furosemide - can we add 'as needed' to the directions? pt takes once daily and second daily dose PRN (reports he was instructed by cardiologist)  Venlafaxine (75 mg and 150 mg)  Clopidogrel  Losartan  Please send refills to [facility] in [place].  Thank you!  [name], PharmD Clinical Pharmacist [facility] at [facility] [phone]

## 2019-10-28 NOTE — Chronic Care Management (AMB) (Deleted)
Chronic Care Management Pharmacy  Name: [name]  MRN: [id number] DOB: 1950/09/06  Chief Complaint/ HPI  [name],  69 y.o. , male presents for their Follow-Up CCM visit with the clinical pharmacist In office.  PCP : Jinny Sanders, MD  Their chronic conditions include:   Office Visits:  7/84/12: out of Trulicity for 1 month, increase Trulicity to 1.5 mg weekly, restart Crestor (off antifungal), purchase BP cuff and monitor at home  Consult Visit:***  Current Diagnosis/Assessment:  Diabetes   Recent Relevant Labs: Lab Results  Component Value Date/Time   HGBA1C 10.1 (A) 10/15/2019 03:04 PM   HGBA1C 10.5 (H) 06/21/2019 07:57 AM   HGBA1C 9.1 (H) 03/12/2019 10:03 AM   HGBA1C 9.6 10/02/2018 12:00 AM   MICROALBUR <0.7 02/21/2017 01:57 PM   MICROALBUR 0.8 02/16/2016 08:24 AM     Checking BG: Last used Freestyle Libre 3 weeks ago; sensor fell off early and he didn't want to waste sensors; reports he washes skin first, this is the first time he has had an issue with falling off early Recent FBG Readings: none to report, unable to see numbers on [place]; reports occasional hypoglycemia overnight if skipping bedtime snack Patient is currently uncontrolled on the following medications:   Dulaglutide 0.75 mg/0.5 mL - Inject once weekly  Novolin 70/30 - Inject 70 units in the morning and 40 units at bedtime (takes up to 15 minutes after meals)   Last diabetic Foot exam:  Lab Results  Component Value Date/Time   HMDIABEYEEXA Retinopathy (A) 01/11/2017 12:00 AM    Last diabetic Eye exam:  Lab Results  Component Value Date/Time   HMDIABFOOTEX done 03/19/2019 12:00 AM    We discussed: Tegaderm patches to cover sensors; patient will come in office on 10/29/19 for assessment of BG and libre use. Tolerating Trulicity well, has noticed reduced appetite, but hasn't noticed weight change; PCP already plans to increase Trulicity dose to 1.5 mg/dL. Pt will discuss with PCP at appt  later this week.  Diet: trying to avoid sweets  Plan: Continue current medications; Discuss Trulicity dose increase with Dr. Diona Browner on 10/11/19. Patient will come into office on 10/29/19 for visit with pharmacist to explain freestyle libre and assess BG readings. Instructed patient to restart using libre today.  Hypertension   BP today is:  {CHL HP UPSTREAM Pharmacist BP ranges:[id number]}  Office blood pressures are  BP Readings from Last 3 Encounters:  10/15/19 (!) 150/90  07/09/19 (!) 146/82  03/19/19 130/66   CMP Latest Ref Rng & Units 06/21/2019 03/12/2019 10/18/2018  Glucose 70 - 99 mg/dL 232(H) 362(H) 197(H)  BUN 6 - 23 mg/dL _0 Creatinine 0.40 - 1.50 mg/dL 1.03 1.07 1.06  Sodium 135 - 145 mEq/L 140 139 139  Potassium 3.5 - 5.1 mEq/L 4.1 4.8 3.5  Chloride 96 - 112 mEq/L 101 102 103  CO2 19 - 32 mEq/L _1 Calcium 8.4 - 10.5 mg/dL 9.2 8.8 8.7  Total Protein 6.0 - 8.3 g/dL 7.0 6.7 7.0  Total Bilirubin 0.2 - 1.2 mg/dL 0.5 0.5 0.4  Alkaline Phos 39 - 117 U/L 86 85 105  AST 0 - 37 U/L _2 ALT 0 - 53 U/L _3 Patient is currently uncontrolled on the following medications:   Carvedilol 6.25 mg - once daily (Gollan)  Losartan 50 mg - once daily   Furosemide 40 mg - once daily (second dose is PRN)  Patient has failed these meds in the past: none reported Patient checks BP at home infrequently Patient home BP readings are ranging: none reported  Plan: Continue current medications. Patient to bring home monitor to clinic for assessment on 10/29/19.   Restarted rosuvastatin??  Current Outpatient Medications on File Prior to Visit  Medication Sig Dispense Refill  . aspirin 81 MG EC tablet Take 1 tablet (81 mg total) by mouth daily.    . Blood Glucose Calibration (ONETOUCH VERIO) High SOLN     . Blood Glucose Monitoring Suppl (ONETOUCH VERIO FLEX SYSTEM) w/Device KIT     . carvedilol (COREG) 6.25 MG tablet Take 1 tablet (6.25 mg total) by mouth  daily. 90 tablet 0  . clopidogrel (PLAVIX) 75 MG tablet TAKE 1 TABLET BY MOUTH ONCE DAILY WITH BREAKFAST 90 tablet 1  . Continuous Blood Gluc Receiver (FREESTYLE LIBRE 14 DAY READER) DEVI 1 Device by Does not apply route every 14 (fourteen) days. 1 Device 0  . Continuous Blood Gluc Sensor (FREESTYLE LIBRE 14 DAY SENSOR) MISC 1 Device by Does not apply route every 14 (fourteen) days. 2 each 5  . Dulaglutide (TRULICITY) 1.96 QI/2.9NL SOPN Inject 1.5 mg into the skin once a week. 3 mL 11  . ezetimibe (ZETIA) 10 MG tablet Take 1 tablet (10 mg total) by mouth daily. 90 tablet 0  . furosemide (LASIX) 40 MG tablet Take 1 tablet (40 mg total) by mouth daily. Can take a 2nd daily dose as needed. 180 tablet 1  . insulin NPH-regular Human (NOVOLIN 70/30) (70-30) 100 UNIT/ML injection Inject 70 units in the morning and 40 units at bedtime. 50 mL 5  . isosorbide mononitrate (IMDUR) 30 MG 24 hr tablet Take 1 tablet (30 mg total) by mouth 2 (two) times daily. 180 tablet 0  . Lancets (ONETOUCH DELICA PLUS [id number]) MISC     . losartan (COZAAR) 50 MG tablet Take 1 tablet (50 mg total) by mouth daily. 90 tablet 1  . nitroGLYCERIN (NITROSTAT) 0.4 MG SL tablet DISSOLVE 1 TABLET UNDER TONGUE AS NEEDEDFOR CHEST PAIN. MAY REPEAT 5 MINUTES APART 3 TIMES IF NEEDED 25 tablet 3  . ONETOUCH VERIO test strip     . potassium chloride SA (K-DUR) 20 MEQ tablet Take 1 tablet (20 mEq total) by mouth daily. 90 tablet 1  . rosuvastatin (CRESTOR) 40 MG tablet Take 1 tablet (40 mg total) by mouth daily. 90 tablet 3  . venlafaxine XR (EFFEXOR-XR) 150 MG 24 hr capsule Take 1 capsule (150 mg total) by mouth daily with breakfast. Take with Effexor-XR 49m for a total dose of 2225m90 capsule 1  . venlafaxine XR (EFFEXOR-XR) 75 MG 24 hr capsule TAKE 1 CAPSULE BY MOUTH ONCE DAILY. TAKEIN ADDITION TO THE 150 MG CAPSULE TO EQUAL A TOTAL DOSE OF 225 MG DAILY 90 capsule 1   No current facility-administered medications on file prior to visit.

## 2019-10-29 ENCOUNTER — Telehealth: Payer: Self-pay | Admitting: Family Medicine

## 2019-10-29 ENCOUNTER — Ambulatory Visit: Payer: HMO

## 2019-10-29 NOTE — Telephone Encounter (Signed)
Patient called today and cancelled his 11:30 appointment He stated that he is not feeling well and did not want to take a chance coming in.

## 2019-11-04 NOTE — Assessment & Plan Note (Signed)
Follow BP at home and call with measurements in 1-2 weeks.

## 2019-11-04 NOTE — Assessment & Plan Note (Signed)
Overdue for eye eval...encouraged pt to return to opthalmology.

## 2019-11-04 NOTE — Assessment & Plan Note (Signed)
Increase trulicity to 1.5 mg weekly. Continue work on [employer] and low carb diet.

## 2019-11-04 NOTE — Assessment & Plan Note (Signed)
Encouraged exercise, weight loss, healthy eating habits. ? ?

## 2019-11-04 NOTE — Assessment & Plan Note (Signed)
Restart crestor. Low cholesterol diet reviewed.

## 2019-11-04 NOTE — Assessment & Plan Note (Signed)
Currently seeing our pharmacist to assist with compliance issues as well as to assist with cost of medications.

## 2019-11-19 ENCOUNTER — Ambulatory Visit: Payer: HMO

## 2019-11-19 ENCOUNTER — Telehealth: Payer: Self-pay | Admitting: Family Medicine

## 2019-11-19 NOTE — Telephone Encounter (Signed)
Patient's daughter called today to cancel patient's 3:30 appointment  They had a death in the family and will not be able to make appointment

## 2019-11-27 ENCOUNTER — Telehealth: Payer: Self-pay

## 2019-11-27 ENCOUNTER — Ambulatory Visit: Payer: Self-pay

## 2019-11-27 DIAGNOSIS — E1339 Other specified diabetes mellitus with other diabetic ophthalmic complication: Secondary | ICD-10-CM

## 2019-11-27 DIAGNOSIS — R5383 Other fatigue: Secondary | ICD-10-CM

## 2019-11-27 DIAGNOSIS — IMO0002 Reserved for concepts with insufficient information to code with codable children: Secondary | ICD-10-CM

## 2019-11-27 DIAGNOSIS — I10 Essential (primary) hypertension: Secondary | ICD-10-CM

## 2019-11-27 DIAGNOSIS — I2510 Atherosclerotic heart disease of native coronary artery without angina pectoris: Secondary | ICD-10-CM

## 2019-11-27 DIAGNOSIS — F4323 Adjustment disorder with mixed anxiety and depressed mood: Secondary | ICD-10-CM

## 2019-11-27 NOTE — Chronic Care Management (AMB) (Signed)
Chronic Care Management Pharmacy  Name: [name]  MRN: [id number] DOB: [birth year]  Chief Complaint/ HPI  [name],  69 y.o., male presents for their Follow-Up CCM visit with the clinical pharmacist via telephone.  PCP : Jinny Sanders, MD  Their chronic conditions addressed today include: depression, diabetes, hypertension, CAD, hyperlipidemia   Patient concerns: very depressed, naps during the day, unable to sleep at night until 4-5 AM  Office Visits: 8/58/85: out of Trulicity for 1 month, increase Trulicity to 1.5 mg weekly, restart Crestor, (off antifungal), purchase BP cuff and monitor at home  Consult Visit: none since last CCM visit on 10/09/19  Allergies  Allergen Reactions  . Atorvastatin Other (See Comments)    Body aches   Medications: Outpatient Encounter Medications as of 11/27/2019  Medication Sig  . aspirin 81 MG EC tablet Take 1 tablet (81 mg total) by mouth daily.  . Blood Glucose Calibration (ONETOUCH VERIO) High SOLN   . Blood Glucose Monitoring Suppl (ONETOUCH VERIO FLEX SYSTEM) w/Device KIT   . carvedilol (COREG) 6.25 MG tablet Take 1 tablet (6.25 mg total) by mouth daily.  . clopidogrel (PLAVIX) 75 MG tablet TAKE 1 TABLET BY MOUTH ONCE DAILY WITH BREAKFAST  . Continuous Blood Gluc Receiver (FREESTYLE LIBRE 14 DAY READER) DEVI 1 Device by Does not apply route every 14 (fourteen) days.  . Continuous Blood Gluc Sensor (FREESTYLE LIBRE 14 DAY SENSOR) MISC 1 Device by Does not apply route every 14 (fourteen) days.  . Dulaglutide (TRULICITY) 0.27 XA/1.2IN SOPN Inject 1.5 mg into the skin once a week.  . ezetimibe (ZETIA) 10 MG tablet Take 1 tablet (10 mg total) by mouth daily.  . furosemide (LASIX) 40 MG tablet Take 1 tablet (40 mg total) by mouth daily. Can take a 2nd daily dose as needed.  . insulin NPH-regular Human (NOVOLIN 70/30) (70-30) 100 UNIT/ML injection Inject 70 units in the morning and 40 units at bedtime.  . isosorbide mononitrate (IMDUR)  30 MG 24 hr tablet Take 1 tablet (30 mg total) by mouth 2 (two) times daily.  . Lancets (ONETOUCH DELICA PLUS [id number]) [place]   . losartan (COZAAR) 50 MG tablet Take 1 tablet (50 mg total) by mouth daily.  . nitroGLYCERIN (NITROSTAT) 0.4 MG SL tablet DISSOLVE 1 TABLET UNDER TONGUE AS NEEDEDFOR CHEST PAIN. MAY REPEAT 5 MINUTES APART 3 TIMES IF NEEDED  . ONETOUCH VERIO test strip   . potassium chloride SA (K-DUR) 20 MEQ tablet Take 1 tablet (20 mEq total) by mouth daily.  . rosuvastatin (CRESTOR) 40 MG tablet Take 1 tablet (40 mg total) by mouth daily.  Marland Kitchen venlafaxine XR (EFFEXOR-XR) 150 MG 24 hr capsule Take 1 capsule (150 mg total) by mouth daily with breakfast. Take with Effexor-XR 83m for a total dose of 2221m . venlafaxine XR (EFFEXOR-XR) 75 MG 24 hr capsule TAKE 1 CAPSULE BY MOUTH ONCE DAILY. TAKEIN ADDITION TO THE 150 MG CAPSULE TO EQUAL A TOTAL DOSE OF 225 MG DAILY   No facility-administered encounter medications on file as of 11/27/2019.   Current Diagnosis/Assessment: Goals    . Client will report weighing and recording weights daily within 6 months.    . Client will verbalize knowledge of diabetes self-management as evidenced by Hgb A1C <7 or as defined by provider.     Diabetes self management actions:  Glucose monitoring per provider recommendations  Eat Healthy  Check feet daily  Visit provider every 3-6 months as directed  Hbg A1C  level every 3-6 months.   Eye Exam yearly    . Pharmacy Care Plan     Current Barriers:  . Chronic Disease Management support, education, and care coordination needs related to: hypertension, hyperlipidemia, congestive heart failure, coronary artery disease, GERD, diabetes, diabetic retinopathy, osteoarthritis, major depressive disorder  Pharmacist Clinical Goal(s):  Marland Kitchen Improve mood and fatigue. We discussed the following goals today: o Continue to get outside by mowing 2-3 lawns per week. o Attend church this Sunday to be around your  community. o Walk to your mailbox daily. o Participate in one additional activity outside of your home weekly. o Reduce caffeine intake and limit daytime napping. . Maintain blood pressure within goal of < 140/90 mmHg. Recommend picking up a blood monitor from [employer]. The price is $35. Let me know if this is affordable for you. Check your blood pressure daily in the morning before medications and keep a written log of your readings. . Improve diabetes control with goal of A1c < 7%. Begin using freestyle Libre sensor today.  Dr. Diona Browner increased your Trulicity to 1.5 mg weekly. I have contacted [name] so they know your correct dose. Please finish out the 0.75 mg dose by taking 2 injections next Thursday 12/05/19, then begin one 1.5 mg injection weekly. Pharmacist will call to follow up on readings in one week.  . Prevent heart failure exacerbations. Recommend checking weight daily, if weight gain of more than 3 pounds in one day or 5 pounds in one week, call your doctor. . Achieve cholesterol goal of LDL < 70 mg/dL. I have called the rosuvastatin in to [facility] and it will be delivered with your next bubble pack.   Interventions: . Comprehensive medication review performed. . Coordinate adherence packaging and medication delivery through [facility].  Patient Self Care Activities:  . Self-administers medications as prescribed . Self monitors vital signs, including blood pressure, heart rate, blood glucose  Please see past updates related to this goal by clicking on the "Past Updates" button in the selected goal         Diabetes  Recent Relevant Labs: Lab Results  Component Value Date/Time   HGBA1C 10.1 (A) 10/15/2019 03:04 PM   HGBA1C 10.5 (H) 06/21/2019 07:57 AM   HGBA1C 9.1 (H) 03/12/2019 10:03 AM   HGBA1C 9.6 10/02/2018 12:00 AM   MICROALBUR <0.7 02/21/2017 01:57 PM   MICROALBUR 0.8 02/16/2016 08:24 AM    Checking BG: None Patient is  currently uncontrolled on the following medications:   Dulaglutide 0.75 mg/0.5 mL - Inject 1.5 mg once weekly  Novolin 70/30 - Inject 70 units in the morning and 40 units at bedtime (takes up to 15 minutes after meals)   Last diabetic eye exam:  Lab Results  Component Value Date/Time   HMDIABEYEEXA Retinopathy (A) 01/11/2017 12:00 AM    Last diabetic foot exam:  Lab Results  Component Value Date/Time   HMDIABFOOTEX done 03/19/2019 12:00 AM    We discussed: patient continues medications as prescribed but has very little energy and has not started back on freestyle [place]; confirms he would rather use Libre than traditional fingerstick method and will start the [place] today with plan for a telephone follow up call in 1 week to assess readings; due to confusion with pharmacies, pt has not started increased dose of Trulicity. [place] today and set up patient for medication sync and bubble packaging. Will have patient take Trulicity 4.23 mg this Thursday, then 2 injections the following  week, then pick up Trulicity 1.5 mg (refilled 0.75 mg dose last week).  Exercise: limited due to fatigue, fear of falls Diet: tries to limit sweets  Plan: [place] today. Pharmacist will call in 1 week to reassess. Begin taking 2 injections of Trulicity next Thursday, 12/05/19, then pick up higher dose from pharmacy.   Hyperlipidemia  Lipid Panel     Component Value Date/Time   CHOL 200 06/21/2019 0757   TRIG 153.0 (H) 06/21/2019 0757   HDL 35.70 (L) 06/21/2019 0757   CHOLHDL 6 06/21/2019 0757   VLDL 30.6 06/21/2019 0757   LDLCALC 133 (H) 06/21/2019 0757   LDLDIRECT 174.0 10/19/2017 1600   LDL goal < 70  Patient has failed these meds in past: atorvastatin Patient is currently uncontrolled on the following medications:   Rosuvastatin 40 mg - once daily  Ezetimibe 10 mg - once daily (Gollan)  We discussed: Pt has not yet started rosuvastatin, called [facility] today to  ensure they have rx and they will put it in the bubble packaging for next delivery.  Plan: Continue current medications. Recommend repeat lipid panel in 4-12 weeks.   Hypertension   Office blood pressures are  BP Readings from Last 3 Encounters:  10/15/19 (!) 150/90  07/09/19 (!) 146/82  03/19/19 130/66   CMP Latest Ref Rng & Units 06/21/2019 03/12/2019 10/18/2018  Glucose 70 - 99 mg/dL 232(H) 362(H) 197(H)  BUN 6 - 23 mg/dL 22 19 15   Creatinine 0.40 - 1.50 mg/dL 1.03 1.07 1.06  Sodium 135 - 145 mEq/L 140 139 139  Potassium 3.5 - 5.1 mEq/L 4.1 4.8 3.5  Chloride 96 - 112 mEq/L 101 102 103  CO2 19 - 32 mEq/L 30 29 29   Calcium 8.4 - 10.5 mg/dL 9.2 8.8 8.7  Total Protein 6.0 - 8.3 g/dL 7.0 6.7 7.0  Total Bilirubin 0.2 - 1.2 mg/dL 0.5 0.5 0.4  Alkaline Phos 39 - 117 U/L 86 85 105  AST 0 - 37 U/L 18 16 16   ALT 0 - 53 U/L 21 19 19    Patient is currently uncontrolled on the following medications:   Carvedilol 6.25 mg - once daily (Gollan)  Losartan 50 mg - once daily   Furosemide 40 mg - once daily (second dose is PRN)  Patient has failed these meds in the past: none reported (does not have a monitor); not taking potassium anymore, unsure when it was discontinued, but will recheck potassium at next office visit   Plan: Continue current medications. Recommend purchasing a home blood pressure monitor with arm cuff. Checked pricing at [place], $35. Recommend labs with next office visit to assess potassium.   MDD  Symptoms: energy remains low, but sleep schedule is increasingly more erratic; previously going to sleep at 11PM-1AM now unable to fall asleep until 4-5 AM, due to depressed mood, low energy, no longer enjoys watching TV and hasn't checked his mail in more than a week Sleep hygiene: taking multiple naps through the day, drinks caffeine (diet soda) throughout the day  Patient has failed these meds in past: none reported Patient is currently uncontrolled on the following  medications:   Venlafaxine XR 75 mg and 150 mg - take total dose of 225 mg once daily  Tylenol PM - PRN for sleep   We discussed: pt is aware not leaving the home, not doing the things he used to do, and mourning his wife all contribute to current mood; completed PHQ-9 today (score:15--> moderately severe depression); denies  any thoughts of harming himself, reports he has faith that gives him hope; would like to stay on venlafaxine as he believes it helps  Goal setting: mow 2-3 lawns per week, go to church this Sunday on Easter (hasn't been in over a year and would like to return), check mail daily, reduce caffeine intake and limit napping; participate in 1 additional activity outside of the home weekly  Plan: Continue current medications; Follow up with pharmacist monthly for accountability with goal setting.   Medication Management: OTCs: no longer taking anything for acid reflux, taking vitamin D 1000 IU - 2 a day, Nepro drink 1 daily, tylenol PM PRN  Pharmacy: [facility]; currently picks up or delivery --> recommend sync meds and packaging (would like me to call [name] and get bubble packs and med sync in place - called and completed today) Adherence: Family fills pillbox once weekly; history of non-adherence due to cost; Takes morning medications around 11 AM (with breakfast - oatmeal, eggs, cornflakes, takes the rest at supper (evening meal); Thursdays - Trulicity Cost concerns: PAP - Trulicity, Novolin 70, 40 units (currently free with insurance until he goes into donut hole; we will reapply for PAP prior to donut hole this year)   Debbora Dus, PharmD Clinical Pharmacist [facility] at [facility] [phone]

## 2019-11-27 NOTE — Patient Instructions (Addendum)
November 27, 2019  Dear [name],  Below is a summary of the goals we discussed on November 27, 2019. Please contact me anytime with questions or concerns.   Visit Information  Goals    . Client will report weighing and recording weights daily within 6 months.    . Client will verbalize knowledge of diabetes self-management as evidenced by Hgb A1C <7 or as defined by provider.     Diabetes self management actions:  Glucose monitoring per provider recommendations  Eat Healthy  Check feet daily  Visit provider every 3-6 months as directed  Hbg A1C level every 3-6 months.   Eye Exam yearly    . Pharmacy Care Plan     Current Barriers:  . Chronic Disease Management support, education, and care coordination needs related to: hypertension, hyperlipidemia, congestive heart failure, coronary artery disease, GERD, diabetes, diabetic retinopathy, osteoarthritis, major depressive disorder  Pharmacist Clinical Goal(s):  Marland Kitchen Improve mood and fatigue. We discussed the following goals today: o Continue to get outside by mowing 2-3 lawns per week. o Attend church this Sunday to be around your community. o Walk to your mailbox daily. o Participate in one additional activity outside of your home weekly. o Reduce caffeine intake and limit daytime napping. . Maintain blood pressure within goal of < 140/90 mmHg. Recommend picking up a blood monitor from [employer]. The price is $35. Let me know if this is affordable for you. Check your blood pressure daily in the morning before medications and keep a written log of your readings. . Improve diabetes control with goal of A1c < 7%. Begin using freestyle Libre sensor today.  Dr. Ermalene Searing increased your Trulicity to 1.5 mg weekly. I have contacted [facility] so they know your correct dose. Please finish out the 0.75 mg dose by taking 2 injections next Thursday 12/05/19, then begin one 1.5 mg injection weekly. Pharmacist will call to follow up on  readings in one week.  . Prevent heart failure exacerbations. Recommend checking weight daily, if weight gain of more than 3 pounds in one day or 5 pounds in one week, call your doctor. . Achieve cholesterol goal of LDL < 70 mg/dL. I have called the rosuvastatin in to [facility] and it will be delivered with your next bubble pack.   Interventions: . Comprehensive medication review performed. . Coordinate adherence packaging and medication delivery through [facility].  Patient Self Care Activities:  . Self-administers medications as prescribed . Self monitors vital signs, including blood pressure, heart rate, blood glucose  Please see past updates related to this goal by clicking on the "Past Updates" button in the selected goal          The patient verbalized understanding of instructions provided today and agreed to receive a mailed copy of patient instruction and/or educational materials.  The pharmacy team will reach out to the patient again over the next 7 days.   [name], PharmD Clinical Pharmacist [facility] at [facility] [phone]

## 2019-11-27 NOTE — Telephone Encounter (Signed)
Dr Ermalene Searing,  I believe you increased Mr. Hanisch Trulicity at your last visit with him. It seems the prescription was written for the 0.75 mg dose but the directions say to take 1.5 mg weekly. Could you send in a new prescription for the 1.5 mg dose so he doesn't have to give himself 2 injections each week or did you want him to keep the lower dose in case he didn't tolerate it?  Thanks!  [name], PharmD Clinical Pharmacist [facility] at [facility] [phone]

## 2019-11-28 MED ORDER — TRULICITY 1.5 MG/0.5ML ~~LOC~~ SOAJ: 1.5000 mg | SUBCUTANEOUS | 11 refills | Status: DC

## 2019-11-28 NOTE — Telephone Encounter (Signed)
Nope, that was unintentional. I sent in the correct dose prescription. Thanks!

## 2019-12-02 NOTE — Progress Notes (Deleted)
Virtual Visit via Telephone Note   This visit type was conducted due to national recommendations for restrictions regarding the COVID-19 Pandemic (e.g. social distancing) in an effort to limit this patient's exposure and mitigate transmission in our community.  Due to her co-morbid illnesses, this patient is at least at moderate risk for complications without adequate follow up.  This format is felt to be most appropriate for this patient at this time.  The patient did not have access to video technology/had technical difficulties with video requiring transitioning to audio format only (telephone).  All issues noted in this document were discussed and addressed.  No physical exam could be performed with this format.  Please refer to the patient's chart for her  consent to telehealth for [facility].    Date:  12/02/2019   ID:  [name], DOB Jul 14, 1951, MRN [id number]  Patient Location:  [place] [address]   Provider location:   [facility], [place] office  PCP:  Jinny Sanders, MD  Cardiologist:  Patsy Baltimore  Chief Complaint: Fatigue, depression    History of Present Illness:    [name] is a 69 y.o. male who presents via audio/video conferencing for a telehealth visit today.   The patient does not symptoms concerning for COVID-19 infection (fever, chills, cough, or new SHORTNESS OF BREATH).   Patient has a past medical history of morbid obesity,  HTN,  DM,  CAD  NSTEMI in 11/2015 (PCI to LAD; was discharged on Plavix) and again 02/2016 (no PCI; was discharged on Brilinta), Again presented to [facility] on 04/05/16 for NSTEMI (no PCI),  catheterization 04/06/2016, stable LAD stent, occluded RCA with collaterals Chronic total occlusion of RCA EF 55 to 60% Chronic chest pain syndrome GERD Who presents for follow-up of his coronary artery disease  Wife died 2 years ago Lives on his own Does his own chores  Tired all the  time With walking , gets tired Watches TV a lot  Depression still an issue Weight continues to run high,  dietary noncompliance ,  Previously worked part time chicken farm, 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  Widely patent proximal to mid LAD stent previously placed in July. There is first diagonal diagonal and LAD 30 and 50% narrowing respectively.  Widely patent circumflex unchanged from previous with 70% narrowing in a small branch of the first marginal. Circumflex collateralizes the distal right coronary left ventricular branch.  Inferobasal hypokinesis. EF 50%. EDP is normal.  Unable to identify a significant change in the angiographic appearance since prior study in July.   Past Medical History:  Diagnosis Date  . Back injury 02/2002   worker's comp  . CHF (congestive heart failure) ([place])   . Coronary artery disease, non-occlusive    a. cath 2002 with no sig CAD;  b. cath 2008 normal LM, LAD, LCx, p&dRCA 20-30%, PDA 30%; c.11/2015 NSTEMI/PCI: LM nl, LAD 95p (2.5x15 Xience DES), LCX nl, RCA 100p/m w/ L->R collats, EF 55-65% c. NSTEMI (02/2016)  with no culprit leision, switched to Brilinta.  d. NSTEMI 03/2016: again, no culprit lesion and switched back to plavix 2/2 SOB with Brilnta.    . Depression   . Diabetes mellitus type 2, insulin dependent ([place])   . Hyperlipemia   . Hypertensive heart disease   . Kidney stones   . Morbid obesity ([place])   . Osteoarthritis   . Snoring    Past Surgical History:  Procedure Laterality Date  .  CARDIAC CATHETERIZATION  09/2000   diffuse LAD 30% LCA  EF 50-60%  . CARDIAC CATHETERIZATION  06/2007   no significant CAD  . CARDIAC CATHETERIZATION N/A 12/25/2015   Procedure: Left Heart Cath and Coronary Angiography;  Surgeon: Minna Merritts, MD;  Location: [place] [facility];  Service: Cardiovascular;  Laterality: N/A;  . CARDIAC CATHETERIZATION N/A 12/25/2015   Procedure: Coronary Stent Intervention;  Surgeon: Yolonda Kida, MD;  Location: [place] [facility];  Service: Cardiovascular;  Laterality: N/A;  . CARDIAC CATHETERIZATION N/A 03/10/2016   Procedure: Left Heart Cath and Coronary Angiography;  Surgeon: Wellington Hampshire, MD;  Location: [place] [facility];  Service: Cardiovascular;  Laterality: N/A;  . CARDIAC CATHETERIZATION N/A 04/06/2016   Procedure: Left Heart Cath and Coronary Angiography;  Surgeon: Belva Crome, MD;  Location: [place] [facility];  Service: Cardiovascular;  Laterality: N/A;  . CIRCUMCISION       No outpatient medications have been marked as taking for the 12/03/19 encounter (Appointment) with Minna Merritts, MD.     Allergies:   Atorvastatin   Social History   Tobacco Use  . Smoking status: Never Smoker  . Smokeless tobacco: Never Used  Substance Use Topics  . Alcohol use: No    Alcohol/week: 0.0 standard drinks  . Drug use: No     Current Outpatient Medications on File Prior to Visit  Medication Sig Dispense Refill  . aspirin 81 MG EC tablet Take 1 tablet (81 mg total) by mouth daily.    . Blood Glucose Calibration (ONETOUCH VERIO) High SOLN     . Blood Glucose Monitoring Suppl (ONETOUCH VERIO FLEX SYSTEM) w/Device KIT     . carvedilol (COREG) 6.25 MG tablet Take 1 tablet (6.25 mg total) by mouth daily. 90 tablet 0  . clopidogrel (PLAVIX) 75 MG tablet TAKE 1 TABLET BY MOUTH ONCE DAILY WITH BREAKFAST 90 tablet 1  . Continuous Blood Gluc Receiver (FREESTYLE LIBRE 14 DAY READER) DEVI 1 Device by Does not apply route every 14 (fourteen) days. 1  Device 0  . Continuous Blood Gluc Sensor (FREESTYLE LIBRE 14 DAY SENSOR) MISC 1 Device by Does not apply route every 14 (fourteen) days. 2 each 5  . Dulaglutide (TRULICITY) 1.5 KM/6.2MM SOPN Inject 1.5 mg into the skin once a week. 3 mL 11  . ezetimibe (ZETIA) 10 MG tablet Take 1 tablet (10 mg total) by mouth daily. 90 tablet 0  . furosemide (LASIX) 40 MG tablet Take 1 tablet (40 mg total) by mouth daily. Can take a 2nd daily dose as needed. 180 tablet 1  . insulin NPH-regular Human (NOVOLIN 70/30) (70-30) 100 UNIT/ML injection Inject 70 units in the morning and 40 units at bedtime. 50 mL 5  . isosorbide mononitrate (IMDUR) 30 MG 24 hr tablet Take 1 tablet (30 mg total) by mouth 2 (two) times daily. 180 tablet 0  . Lancets (ONETOUCH DELICA PLUS [id number]) MISC     . losartan (COZAAR) 50 MG tablet Take 1 tablet (50  mg total) by mouth daily. 90 tablet 1  . nitroGLYCERIN (NITROSTAT) 0.4 MG SL tablet DISSOLVE 1 TABLET UNDER TONGUE AS NEEDEDFOR CHEST PAIN. MAY REPEAT 5 MINUTES APART 3 TIMES IF NEEDED 25 tablet 3  . ONETOUCH VERIO test strip     . potassium chloride SA (K-DUR) 20 MEQ tablet Take 1 tablet (20 mEq total) by mouth daily. (Patient not taking: Reported on 11/27/2019) 90 tablet 1  . rosuvastatin (CRESTOR) 40 MG tablet Take 1 tablet (40 mg total) by mouth daily. 90 tablet 3  . venlafaxine XR (EFFEXOR-XR) 150 MG 24 hr capsule Take 1 capsule (150 mg total) by mouth daily with breakfast. Take with Effexor-XR 65m for a total dose of 2233m90 capsule 1  . venlafaxine XR (EFFEXOR-XR) 75 MG 24 hr capsule TAKE 1 CAPSULE BY MOUTH ONCE DAILY. TAKEIN ADDITION TO THE 150 MG CAPSULE TO EQUAL A TOTAL DOSE OF 225 MG DAILY 90 capsule 1   No current facility-administered medications on file prior to visit.     Family Hx: [name] family history includes Alzheimer's disease in his mother; Cancer in his brother; Diabetes in his father; Emphysema in his mother; Heart disease in his father.  ROS:   Please  see the history of present illness.    Review of Systems  Constitutional: Negative.   Respiratory: Negative.   Cardiovascular: Negative.   Gastrointestinal: Positive for heartburn.  Musculoskeletal: Negative.   Neurological: Negative.   Psychiatric/Behavioral: Negative.   All other systems reviewed and are negative.     Labs/Other Tests and Data Reviewed:    Recent Labs: 06/21/2019: ALT 21; BUN 22; Creatinine, Ser 1.03; Potassium 4.1; Sodium 140   Recent Lipid Panel Lab Results  Component Value Date/Time   CHOL 200 06/21/2019 07:57 AM   TRIG 153.0 (H) 06/21/2019 07:57 AM   HDL 35.70 (L) 06/21/2019 07:57 AM   CHOLHDL 6 06/21/2019 07:57 AM   LDLCALC 133 (H) 06/21/2019 07:57 AM   LDLDIRECT 174.0 10/19/2017 04:00 PM    Wt Readings from Last 3 Encounters:  10/15/19 294 lb 8 oz (133.6 kg)  07/09/19 292 lb 8 oz (132.7 kg)  03/19/19 289 lb 12 oz (131.4 kg)     Exam:    Vital Signs:  120s over 70, pulse 80, respirations 16  Well nourished, well developed male in no acute distress.    ASSESSMENT & PLAN:    Coronary artery disease of native artery of native heart with stable angina pectoris (HCC) Currently with no symptoms of angina. No further workup at this time. Continue current medication regimen.Stable  [place] have encouraged continued exercise, careful diet management in an effort to lose weight.  Very deconditioned  Hyperlipidemia LDL goal <70 Cholesterol is at goal on the current lipid regimen. No changes to the medications were made.  Essential hypertension, benign Blood pressure is well controlled on today's visit. No changes made to the medications. Stable  DM (diabetes mellitus), secondary, uncontrolled, w/eye complications (HCC) Hemoglobin A1c markedly elevated greater than 9 Discussed his diet with him  he drinks diet soda Recommended low carbohydrate meals and more walking  GERD: Stable, takes PPI, sometimes H2 blocker Tries to  avoid certain foods  COYTKPT-46ducation: The signs and symptoms of COVID-19 were discussed with the patient and how to seek care for testing (follow up with PCP or arrange E-visit).  The importance of social distancing was discussed today.  Patient Risk:   After full review of this patients clinical  status, I feel that they are at least moderate risk at this time.  Time:   Today, I have spent 25 minutes with the patient with telehealth technology discussing GERD symptoms, anginal symptoms, diet changes needed for weight loss and conditioning.  Recommended walking program.     Medication Adjustments/Labs and Tests Ordered: Current medicines are reviewed at length with the patient today.  Concerns regarding medicines are outlined above.   Tests Ordered: No tests ordered   Medication Changes: No changes made   Disposition: Follow-up in 6 months   Signed, Ida Rogue, MD  12/02/2019 6:29 PM    [place] Office [address], [place], [place] [address]

## 2019-12-03 ENCOUNTER — Ambulatory Visit: Payer: HMO | Admitting: Cardiovascular Disease

## 2019-12-11 DIAGNOSIS — E113393 Type 2 diabetes mellitus with moderate nonproliferative diabetic retinopathy without macular edema, bilateral: Secondary | ICD-10-CM | POA: Diagnosis not present

## 2019-12-11 DIAGNOSIS — H348112 Central retinal vein occlusion, right eye, stable: Secondary | ICD-10-CM | POA: Diagnosis not present

## 2019-12-11 DIAGNOSIS — H34812 Central retinal vein occlusion, left eye, with macular edema: Secondary | ICD-10-CM | POA: Diagnosis not present

## 2019-12-11 DIAGNOSIS — H35372 Puckering of macula, left eye: Secondary | ICD-10-CM | POA: Diagnosis not present

## 2019-12-11 LAB — HM DIABETES EYE EXAM

## 2019-12-23 NOTE — Progress Notes (Signed)
Date:  12/24/2019   ID:  [name], DOB May 22, 1951, MRN [id number]  Patient Location:  [place] [address]   Provider location:   [facility], [place] office  PCP:  Jinny Sanders, MD  Cardiologist:  Patsy Baltimore  Chief Complaint  Patient presents with  . other    12 month follow up. Meds reviewed by the pt. verbally. Pt. c/o feeling tired all the time, has no energy and doesn't want to be around anybody.      History of Present Illness:    [name] is a 69 y.o. male past medical history of morbid obesity,  HTN,  DM,  CAD  NSTEMI in 11/2015 (PCI to LAD; was discharged on Plavix) and again 02/2016 (no PCI; was discharged on Brilinta), Again presented to [facility] ER on 04/05/16 for NSTEMI (no PCI),  catheterization 04/06/2016, stable LAD stent, occluded RCA with collaterals Chronic total occlusion of RCA EF 55 to 60% Chronic chest pain syndrome GERD Who presents for follow-up of his coronary artery disease  Wife died 3 years ago Lives on his own Does his own chores Sleeps day, goes to sleep 3-4 Am Sleeps till 12 noon Watches TV a lot dietary noncompliance ,   Previously worked part time chicken farm, 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  Was off statin for a while, Back on it now  Having leg swelling Thinks he is retaining fluid, high fluid intake Takes Lasix 40 daily with extra Lasix as needed  Lab work reviewed with him HBA1C 10.1 Having difficulty with his diet  EKG personally reviewed by myself on todays visit Shows normal sinus rhythm rate 75 bpm consider old inferior MI  Long history of atypical chest pain, Numerous cardiac catheterizations including July 2017, August 2017, treated medically Not much chest pain     Prior CV studies:   The following studies were reviewed today:   Chronic total occlusion of the right coronary in the proximal segment well collateralized from the left circumflex and LAD.  Widely  patent proximal to mid LAD stent previously placed in July. There is first diagonal diagonal and LAD 30 and 50% narrowing respectively.  Widely patent circumflex unchanged from previous with 70% narrowing in a small branch of the first marginal. Circumflex collateralizes the distal right coronary left ventricular branch.  Inferobasal hypokinesis. EF 50%. EDP is normal.  Unable to identify a significant change in the angiographic appearance since prior study in July.   Past Medical History:  Diagnosis Date  . Back injury 02/2002   worker's comp  . CHF (congestive heart failure) ([place])   . Coronary artery disease, non-occlusive    a. cath 2002 with no sig CAD;  b. cath 2008 normal LM, LAD, LCx, p&dRCA 20-30%, PDA 30%; c.11/2015 NSTEMI/PCI: LM nl, LAD 95p (2.5x15 Xience DES), LCX nl, RCA 100p/m w/ L->R collats, EF 55-65% c. NSTEMI (02/2016) with no culprit leision, switched to Brilinta.  d. NSTEMI 03/2016: again, no culprit lesion and switched back to plavix 2/2 SOB with Brilnta.    . Depression   . Diabetes mellitus type 2, insulin dependent ([place])   . Hyperlipemia   . Hypertensive heart disease   . Kidney stones   . Morbid obesity ([place])   . Osteoarthritis   . Snoring    Past Surgical History:  Procedure Laterality Date  . CARDIAC CATHETERIZATION  09/2000   diffuse LAD 30% LCA  EF 50-60%  . CARDIAC  CATHETERIZATION  06/2007   no significant CAD  . CARDIAC CATHETERIZATION N/A 12/25/2015   Procedure: Left Heart Cath and Coronary Angiography;  Surgeon: Minna Merritts, MD;  Location: [place] [facility];  Service: Cardiovascular;  Laterality: N/A;  . CARDIAC CATHETERIZATION N/A 12/25/2015   Procedure: Coronary Stent Intervention;  Surgeon: Yolonda Kida, MD;  Location: [place] [facility];  Service: Cardiovascular;  Laterality: N/A;  . CARDIAC CATHETERIZATION N/A 03/10/2016   Procedure: Left Heart Cath and Coronary Angiography;  Surgeon: Wellington Hampshire, MD;  Location: [place] [facility]  LAB;  Service: Cardiovascular;  Laterality: N/A;  . CARDIAC CATHETERIZATION N/A 04/06/2016   Procedure: Left Heart Cath and Coronary Angiography;  Surgeon: Belva Crome, MD;  Location: [place] [facility];  Service: Cardiovascular;  Laterality: N/A;  . CIRCUMCISION       Current Meds  Medication Sig  . aspirin 81 MG EC tablet Take 1 tablet (81 mg total) by mouth daily.  . Blood Glucose Calibration (ONETOUCH VERIO) High SOLN   . Blood Glucose Monitoring Suppl (ONETOUCH VERIO FLEX SYSTEM) w/Device KIT   . carvedilol (COREG) 6.25 MG tablet Take 1 tablet (6.25 mg total) by mouth daily.  . clopidogrel (PLAVIX) 75 MG tablet TAKE 1 TABLET BY MOUTH ONCE DAILY WITH BREAKFAST  . Continuous Blood Gluc Receiver (FREESTYLE LIBRE 14 DAY READER) DEVI 1 Device by Does not apply route every 14 (fourteen) days.  . Continuous Blood Gluc Sensor (FREESTYLE LIBRE 14 DAY SENSOR) MISC 1 Device by Does not apply route every 14 (fourteen) days.  . Dulaglutide (TRULICITY) 1.5 RU/0.4VW SOPN Inject 1.5 mg into the skin once a week.  . ezetimibe (ZETIA) 10 MG tablet Take 1 tablet (10 mg total) by mouth daily.  . furosemide (LASIX) 40 MG tablet Take 1 tablet (40 mg total) by mouth daily. Can take a 2nd daily dose as needed.  . insulin NPH-regular Human (NOVOLIN 70/30) (70-30) 100 UNIT/ML injection Inject 70 units in the morning and 40 units at bedtime.  . isosorbide mononitrate (IMDUR) 30 MG 24 hr tablet Take 1 tablet (30 mg total) by mouth 2 (two) times daily.  . Lancets (ONETOUCH DELICA PLUS [id number]) [place]   . losartan (COZAAR) 50 MG tablet Take 1 tablet (50 mg total) by mouth daily.  . nitroGLYCERIN (NITROSTAT) 0.4 MG SL tablet DISSOLVE 1 TABLET UNDER TONGUE AS NEEDEDFOR CHEST PAIN. MAY REPEAT 5 MINUTES APART 3 TIMES IF NEEDED  . ONETOUCH VERIO test strip   . potassium chloride SA (K-DUR) 20 MEQ tablet Take 1 tablet (20 mEq total) by mouth daily.  . rosuvastatin (CRESTOR) 40 MG tablet Take 1 tablet (40 mg total) by  mouth daily.  Marland Kitchen venlafaxine XR (EFFEXOR-XR) 150 MG 24 hr capsule Take 1 capsule (150 mg total) by mouth daily with breakfast. Take with Effexor-XR 75m for a total dose of 2216m . venlafaxine XR (EFFEXOR-XR) 75 MG 24 hr capsule TAKE 1 CAPSULE BY MOUTH ONCE DAILY. TAKEIN ADDITION TO THE 150 MG CAPSULE TO EQUAL A TOTAL DOSE OF 225 MG DAILY     Allergies:   Atorvastatin   Social History   Tobacco Use  . Smoking status: Never Smoker  . Smokeless tobacco: Never Used  Substance Use Topics  . Alcohol use: No    Alcohol/week: 0.0 standard drinks  . Drug use: No     Family Hx: [name] family history includes Alzheimer's disease in his mother; Cancer in his brother; Diabetes in his father; Emphysema  in his mother; Heart disease in his father.  ROS:   Please see the history of present illness.    Review of Systems  Constitutional: Negative.   Respiratory: Negative.   Cardiovascular: Negative.   Gastrointestinal: Positive for heartburn.  Musculoskeletal: Negative.   Neurological: Negative.   Psychiatric/Behavioral: Negative.   All other systems reviewed and are negative.     Labs/Other Tests and Data Reviewed:    Recent Labs: 06/21/2019: ALT 21; BUN 22; Creatinine, Ser 1.03; Potassium 4.1; Sodium 140   Recent Lipid Panel Lab Results  Component Value Date/Time   CHOL 200 06/21/2019 07:57 AM   TRIG 153.0 (H) 06/21/2019 07:57 AM   HDL 35.70 (L) 06/21/2019 07:57 AM   CHOLHDL 6 06/21/2019 07:57 AM   LDLCALC 133 (H) 06/21/2019 07:57 AM   LDLDIRECT 174.0 10/19/2017 04:00 PM    Wt Readings from Last 3 Encounters:  12/24/19 298 lb 8 oz (135.4 kg)  10/15/19 294 lb 8 oz (133.6 kg)  07/09/19 292 lb 8 oz (132.7 kg)     Exam:   BP 140/64 (BP Location: Left Arm, Patient Position: Sitting, Cuff Size: Large)   Pulse 75   Ht 5' 6"  (1.676 m)   Wt 298 lb 8 oz (135.4 kg)   BMI 48.18 kg/m  Constitutional:  oriented to person, place, and time. No distress.  HENT:  Head: Grossly  normal Eyes:  no discharge. No scleral icterus.  Neck: No JVD, no carotid bruits  Cardiovascular: Regular rate and rhythm, no murmurs appreciated Pulmonary/Chest: Clear to auscultation bilaterally, no wheezes or rails Abdominal: Soft.  no distension.  no tenderness.  Musculoskeletal: Normal range of motion Neurological:  normal muscle tone. Coordination normal. No atrophy Skin: Skin warm and dry Psychiatric: normal affect, pleasant   ASSESSMENT & PLAN:    Coronary artery disease of native artery of native heart with stable angina pectoris (HCC) Currently with no symptoms of angina. No further workup at this time. Continue current medication regimen. Recommended lifestyle modifications  Morbid obesity ([place]) Deconditioned, does not go outside the house much, recommended he work on his diet, walking program  Hyperlipidemia LDL goal <70 Was off the statin, now back on the medication  Essential hypertension, benign Blood pressure is well controlled on today's visit. No changes made to the medications.  DM (diabetes mellitus), secondary, uncontrolled, w/eye complications ([place]) Hemoglobin A1c markedly elevated greater than 10 Poor diet in general, discussed with him in detail  GERD: Stable, takes PPI   Total encounter time more than 25 minutes  Greater than 50% was spent in counseling and coordination of care with the patient   Disposition: Follow-up in 6 months   Signed, Ida Rogue, MD  12/24/2019 3:08 PM    [place] Office [address], [place], [place] [address]

## 2019-12-24 ENCOUNTER — Encounter: Payer: Self-pay | Admitting: Cardiovascular Disease

## 2019-12-24 ENCOUNTER — Ambulatory Visit (INDEPENDENT_AMBULATORY_CARE_PROVIDER_SITE_OTHER): Payer: HMO | Admitting: Cardiovascular Disease

## 2019-12-24 ENCOUNTER — Other Ambulatory Visit: Payer: Self-pay

## 2019-12-24 VITALS — BP 140/64 | HR 75 | Ht 66.0 in | Wt 298.5 lb

## 2019-12-24 DIAGNOSIS — E1339 Other specified diabetes mellitus with other diabetic ophthalmic complication: Secondary | ICD-10-CM

## 2019-12-24 DIAGNOSIS — I10 Essential (primary) hypertension: Secondary | ICD-10-CM | POA: Diagnosis not present

## 2019-12-24 DIAGNOSIS — E1365 Other specified diabetes mellitus with hyperglycemia: Secondary | ICD-10-CM

## 2019-12-24 DIAGNOSIS — I25118 Atherosclerotic heart disease of native coronary artery with other forms of angina pectoris: Secondary | ICD-10-CM | POA: Diagnosis not present

## 2019-12-24 DIAGNOSIS — IMO0002 Reserved for concepts with insufficient information to code with codable children: Secondary | ICD-10-CM

## 2019-12-24 DIAGNOSIS — E785 Hyperlipidemia, unspecified: Secondary | ICD-10-CM

## 2019-12-24 MED ORDER — FUROSEMIDE 40 MG PO TABS: 40.0000 mg | ORAL_TABLET | Freq: Every day | ORAL | 3 refills | Status: DC

## 2019-12-24 NOTE — Patient Instructions (Signed)
Medication Instructions:  Refill on lasix 40 daily Extra lasix in the Pm for leg swelling   If you need a refill on your cardiac medications before your next appointment, please call your pharmacy.    Lab work: No new labs needed   If you have labs (blood work) drawn today and your tests are completely normal, you will receive your results only by: Marland Kitchen MyChart Message (if you have MyChart) OR . A paper copy in the mail If you have any lab test that is abnormal or we need to change your treatment, we will call you to review the results.   Testing/Procedures: No new testing needed   Follow-Up: At [facility], you and your health needs are our priority.  As part of our continuing mission to provide you with exceptional heart care, we have created designated Provider Care Teams.  These Care Teams include your primary Cardiologist (physician) and Advanced Practice Providers (APPs -  Physician Assistants and Nurse Practitioners) who all work together to provide you with the care you need, when you need it.  . You will need a follow up appointment in 12 months   . Providers on your designated Care Team:   . Nicolasa Ducking, NP . Eula Listen, PA-C . Marisue Ivan, PA-C  Any Other Special Instructions Will Be Listed Below (If Applicable).  For educational health videos Log in to : [email or web address] Or : [email or web address], password : [email or web address]

## 2020-01-07 ENCOUNTER — Other Ambulatory Visit (INDEPENDENT_AMBULATORY_CARE_PROVIDER_SITE_OTHER): Payer: HMO

## 2020-01-07 DIAGNOSIS — E1339 Other specified diabetes mellitus with other diabetic ophthalmic complication: Secondary | ICD-10-CM

## 2020-01-07 DIAGNOSIS — E1365 Other specified diabetes mellitus with hyperglycemia: Secondary | ICD-10-CM

## 2020-01-07 DIAGNOSIS — IMO0002 Reserved for concepts with insufficient information to code with codable children: Secondary | ICD-10-CM

## 2020-01-07 DIAGNOSIS — R7989 Other specified abnormal findings of blood chemistry: Secondary | ICD-10-CM

## 2020-01-07 LAB — COMPREHENSIVE METABOLIC PANEL
ALT: 18 U/L (ref 0–53)
AST: 16 U/L (ref 0–37)
Albumin: 4.2 g/dL (ref 3.5–5.2)
Alkaline Phosphatase: 82 U/L (ref 39–117)
BUN: 16 mg/dL (ref 6–23)
CO2: 30 mEq/L (ref 19–32)
Calcium: 9.7 mg/dL (ref 8.4–10.5)
Chloride: 104 mEq/L (ref 96–112)
Creatinine, Ser: 0.93 mg/dL (ref 0.40–1.50)
GFR: 80.29 mL/min (ref >60.00)
Glucose, Bld: 211 mg/dL — ABNORMAL HIGH (ref 70–99)
Potassium: 4.8 mEq/L (ref 3.5–5.1)
Sodium: 142 mEq/L (ref 135–145)
Total Bilirubin: 0.4 mg/dL (ref 0.2–1.2)
Total Protein: 7.5 g/dL (ref 6.0–8.3)

## 2020-01-07 LAB — HEMOGLOBIN A1C: Hgb A1c MFr Bld: 10 % — ABNORMAL HIGH (ref 4.6–6.5)

## 2020-01-07 LAB — LIPID PANEL
Cholesterol: 148 mg/dL (ref 0–200)
HDL: 36.2 mg/dL — ABNORMAL LOW (ref >39.00)
LDL Cholesterol: 92 mg/dL (ref 0–99)
NonHDL: 111.76
Total CHOL/HDL Ratio: 4
Triglycerides: 97 mg/dL (ref 0.0–149.0)
VLDL: 19.4 mg/dL (ref 0.0–40.0)

## 2020-01-07 NOTE — Progress Notes (Signed)
No critical labs need to be addressed urgently. We will discuss labs in detail at upcoming office visit.   

## 2020-01-08 LAB — TESTOSTERONE TOTAL,FREE,BIO, MALES
Albumin: 4.1 g/dL (ref 3.6–5.1)
Sex Hormone Binding: 52 nmol/L (ref 22–77)
Testosterone, Bioavailable: 67.8 ng/dL (ref 15.0–150.0)
Testosterone, Free: 36 pg/mL (ref 6.0–73.0)
Testosterone: 401 ng/dL (ref 250–827)

## 2020-01-08 NOTE — Progress Notes (Signed)
No critical labs need to be addressed urgently. We will discuss labs in detail at upcoming office visit.   

## 2020-01-14 ENCOUNTER — Ambulatory Visit: Payer: HMO | Admitting: Family Medicine

## 2020-01-21 ENCOUNTER — Telehealth: Payer: Self-pay

## 2020-01-21 ENCOUNTER — Ambulatory Visit (INDEPENDENT_AMBULATORY_CARE_PROVIDER_SITE_OTHER): Payer: HMO | Admitting: Family Medicine

## 2020-01-21 ENCOUNTER — Encounter: Payer: Self-pay | Admitting: Family Medicine

## 2020-01-21 ENCOUNTER — Other Ambulatory Visit: Payer: Self-pay

## 2020-01-21 DIAGNOSIS — I10 Essential (primary) hypertension: Secondary | ICD-10-CM | POA: Diagnosis not present

## 2020-01-21 DIAGNOSIS — E1365 Other specified diabetes mellitus with hyperglycemia: Secondary | ICD-10-CM

## 2020-01-21 DIAGNOSIS — E785 Hyperlipidemia, unspecified: Secondary | ICD-10-CM

## 2020-01-21 DIAGNOSIS — IMO0002 Reserved for concepts with insufficient information to code with codable children: Secondary | ICD-10-CM

## 2020-01-21 DIAGNOSIS — E1339 Other specified diabetes mellitus with other diabetic ophthalmic complication: Secondary | ICD-10-CM | POA: Diagnosis not present

## 2020-01-21 MED ORDER — TRULICITY 1.5 MG/0.5ML ~~LOC~~ SOAJ: 1.5000 mg | SUBCUTANEOUS | 11 refills | Status: DC

## 2020-01-21 NOTE — Progress Notes (Signed)
Medication list updated as instructed by Dr. Ermalene Searing.

## 2020-01-21 NOTE — Patient Instructions (Addendum)
Try 1/2 tabs Crestor daily.. to se if less side effects.  Can also try OTC Co enzyme Q10 ( CoQ10).  We will have you restart Trulicity.  Work on simple daily exercises.

## 2020-01-21 NOTE — Assessment & Plan Note (Signed)
Improvement in diet, encouraged small amount of physical activity. Leg lifts etc.

## 2020-01-21 NOTE — Assessment & Plan Note (Signed)
Poor control. Trulicity 1.5 mg daily was sent to [facility].. pt requests meds to instead go to [facility] as in the past.  he has not been on Trulicity at all.. will restart at 1.5 mg daily as no SE in past to lower dose. New rx sent in.

## 2020-01-21 NOTE — Assessment & Plan Note (Signed)
Intolerant of crestor 40 mg daily.. will try 1/2 tab daily as well as CoQ10. Re-eval in 3 months.

## 2020-01-21 NOTE — Addendum Note (Signed)
Addended by: Damita Lack on: 01/21/2020 04:24 PM   Modules accepted: Orders

## 2020-01-21 NOTE — Progress Notes (Signed)
Chief Complaint  Patient presents with  . Diabetes    History of Present Illness: HPI  69 year old  Obese male presents for follow up DM.    Diabetes:  Not at goal  He has been 70/30 insulin 70 units in AM and 40-50 units at night and trulicity ( He has not been using trulicity at [facility] given issue at pharmacy per pt.) Reviewed last OV with Pharmacist on 11/27/2019. Was Libre glucose monitor placed. He reports he has been using it off and on. " I am just to lazy" He is eating healthier, less weets. Lab Results  Component Value Date   HGBA1C 10.0 (H) 01/07/2020  Using medications without difficulties: Hypoglycemic episodes: Hyperglycemic episodes: Feet problems: no ulcers Blood Sugars averaging: FBS 61-90-101, later in day even if not eating  Increases to 300 eye exam within last year:  Elevated Cholesterol:  CAD.Marland Kitchen started crestor 40 mg ( stopped caused arm and shoulder ache and pain) and zetia. Lab Results  Component Value Date   CHOL 148 01/07/2020   HDL 36.20 (L) 01/07/2020   LDLCALC 92 01/07/2020   LDLDIRECT 174.0 10/19/2017   TRIG 97.0 01/07/2020   CHOLHDL 4 01/07/2020  Using medications without problems: Muscle aches:  yes Diet compliance: improved Exercise: minimal Other complaints:   Hypertension:  Good control on Carvedilol 6.25 mg - once daily, Losartan 50 mg - once daily ,Furosemide 40 mg -oncedaily BP Readings from Last 3 Encounters:  01/21/20 138/60  12/24/19 140/64  10/15/19 (!) 150/90  Using medication without problems or lightheadedness: none Chest pain with exertion:none Edema: off and on Short of breath: stable Average home BPs: Other issues:   Fatigue: normal testosterone levels.  very inactive/deconditioned  This visit occurred during the SARS-CoV-2 public health emergency.  Safety protocols were in place, including screening questions prior to the visit, additional usage of staff PPE, and extensive cleaning of exam room while observing  appropriate contact time as indicated for disinfecting solutions.   COVID 19 screen:  No recent travel or known exposure to COVID19 The patient denies respiratory symptoms of COVID 19 at this time. The importance of social distancing was discussed today.     Review of Systems  Constitutional: Positive for malaise/fatigue. Negative for chills and fever.  HENT: Negative for congestion and ear pain.   Eyes: Negative for pain and redness.  Respiratory: Negative for cough.   Cardiovascular: Negative for chest pain, palpitations and leg swelling.  Gastrointestinal: Negative for abdominal pain, blood in stool, constipation, diarrhea, nausea and vomiting.  Genitourinary: Negative for dysuria.  Musculoskeletal: Negative for falls and myalgias.  Skin: Negative for rash.  Neurological: Negative for dizziness.  Psychiatric/Behavioral: Negative for depression. The patient is not nervous/anxious.       Past Medical History:  Diagnosis Date  . Back injury 02/2002   worker's comp  . CHF (congestive heart failure) ([place])   . Coronary artery disease, non-occlusive    a. cath 2002 with no sig CAD;  b. cath 2008 normal LM, LAD, LCx, p&dRCA 20-30%, PDA 30%; c.11/2015 NSTEMI/PCI: LM nl, LAD 95p (2.5x15 Xience DES), LCX nl, RCA 100p/m w/ L->R collats, EF 55-65% c. NSTEMI (02/2016) with no culprit leision, switched to Brilinta.  d. NSTEMI 03/2016: again, no culprit lesion and switched back to plavix 2/2 SOB with Brilnta.    . Depression   . Diabetes mellitus type 2, insulin dependent ([place])   . Hyperlipemia   . Hypertensive heart disease   . Kidney stones   .  Morbid obesity ([place])   . Osteoarthritis   . Snoring     reports that he has never smoked. He has never used smokeless tobacco. He reports that he does not drink alcohol or use drugs.   Current Outpatient Medications:  .  aspirin 81 MG EC tablet, Take 1 tablet (81 mg total) by mouth daily., Disp: , Rfl:  .  Blood Glucose Calibration (ONETOUCH VERIO)  High SOLN, , Disp: , Rfl:  .  Blood Glucose Monitoring Suppl (ONETOUCH VERIO FLEX SYSTEM) w/Device KIT, , Disp: , Rfl:  .  carvedilol (COREG) 6.25 MG tablet, Take 1 tablet (6.25 mg total) by mouth daily., Disp: 90 tablet, Rfl: 0 .  clopidogrel (PLAVIX) 75 MG tablet, TAKE 1 TABLET BY MOUTH ONCE DAILY WITH BREAKFAST, Disp: 90 tablet, Rfl: 1 .  Continuous Blood Gluc Receiver (FREESTYLE LIBRE 14 DAY READER) DEVI, 1 Device by Does not apply route every 14 (fourteen) days., Disp: 1 Device, Rfl: 0 .  Continuous Blood Gluc Sensor (FREESTYLE LIBRE 14 DAY SENSOR) MISC, 1 Device by Does not apply route every 14 (fourteen) days., Disp: 2 each, Rfl: 5 .  Dulaglutide (TRULICITY) 1.5 QJ/3.3LK SOPN, Inject 1.5 mg into the skin once a week., Disp: 3 mL, Rfl: 11 .  ezetimibe (ZETIA) 10 MG tablet, Take 1 tablet (10 mg total) by mouth daily., Disp: 90 tablet, Rfl: 0 .  furosemide (LASIX) 40 MG tablet, Take 1 tablet (40 mg total) by mouth daily. Can take a 2nd daily dose as needed., Disp: 180 tablet, Rfl: 3 .  insulin NPH-regular Human (NOVOLIN 70/30) (70-30) 100 UNIT/ML injection, Inject 70 units in the morning and 40 units at bedtime., Disp: 50 mL, Rfl: 5 .  isosorbide mononitrate (IMDUR) 30 MG 24 hr tablet, Take 1 tablet (30 mg total) by mouth 2 (two) times daily., Disp: 180 tablet, Rfl: 0 .  Lancets (ONETOUCH DELICA PLUS TGYBWL89H) MISC, , Disp: , Rfl:  .  losartan (COZAAR) 50 MG tablet, Take 1 tablet (50 mg total) by mouth daily., Disp: 90 tablet, Rfl: 1 .  nitroGLYCERIN (NITROSTAT) 0.4 MG SL tablet, DISSOLVE 1 TABLET UNDER TONGUE AS NEEDEDFOR CHEST PAIN. MAY REPEAT 5 MINUTES APART 3 TIMES IF NEEDED, Disp: 25 tablet, Rfl: 3 .  ONETOUCH VERIO test strip, , Disp: , Rfl:  .  potassium chloride SA (K-DUR) 20 MEQ tablet, Take 1 tablet (20 mEq total) by mouth daily., Disp: 90 tablet, Rfl: 1 .  venlafaxine XR (EFFEXOR-XR) 150 MG 24 hr capsule, Take 1 capsule (150 mg total) by mouth daily with breakfast. Take with  Effexor-XR 36m for a total dose of 2225m Disp: 90 capsule, Rfl: 1 .  venlafaxine XR (EFFEXOR-XR) 75 MG 24 hr capsule, TAKE 1 CAPSULE BY MOUTH ONCE DAILY. TAKEIN ADDITION TO THE 150 MG CAPSULE TO EQUAL A TOTAL DOSE OF 225 MG DAILY, Disp: 90 capsule, Rfl: 1 .  rosuvastatin (CRESTOR) 40 MG tablet, Take 1 tablet (40 mg total) by mouth daily. (Patient not taking: Reported on 01/21/2020), Disp: 90 tablet, Rfl: 3   Observations/Objective: Blood pressure 138/60, pulse 79, temperature 97.6 F (36.4 C), temperature source Temporal, height 5' 6" (1.676 m), weight 296 lb 4 oz (134.4 kg), SpO2 95 %.  Physical Exam Constitutional:      Appearance: He is well-developed. He is obese.     Comments: Central obesity  HENT:     Head: Normocephalic.     Right Ear: Hearing normal.     Left Ear: Hearing normal.  Nose: Nose normal.  Neck:     Thyroid: No thyroid mass or thyromegaly.     Vascular: No carotid bruit.     Trachea: Trachea normal.  Cardiovascular:     Rate and Rhythm: Normal rate and regular rhythm.     Pulses: Normal pulses.     Heart sounds: Heart sounds not distant. No murmur. No friction rub. No gallop.      Comments: No peripheral edema Pulmonary:     Effort: Pulmonary effort is normal. No respiratory distress.     Breath sounds: Normal breath sounds.  Skin:    General: Skin is warm and dry.     Findings: No rash.  Psychiatric:        Speech: Speech normal.        Behavior: Behavior normal.        Thought Content: Thought content normal.      Assessment and Plan   Morbid obesity (HCC) Improvement in diet, encouraged small amount of physical activity. Leg lifts etc.  Hyperlipidemia LDL goal <70 Intolerant of crestor 40 mg daily.. will try 1/2 tab daily as well as CoQ10. Re-eval in 3 months.  Essential hypertension, benign Well controlled. Continue current medication.   DM (diabetes mellitus), secondary, uncontrolled, w/eye complications (HCC) Poor control. Trulicity  1.5 mg daily was sent to [facility].. pt requests meds to instead go to [facility] as in the past.  he has not been on Trulicity at all.. will restart at 1.5 mg daily as no SE in past to lower dose. New rx sent in.     Amy Bedsole, MD   

## 2020-01-21 NOTE — Assessment & Plan Note (Signed)
Well controlled. Continue current medication.  

## 2020-01-22 ENCOUNTER — Other Ambulatory Visit: Payer: Self-pay | Admitting: Family Medicine

## 2020-01-23 ENCOUNTER — Encounter: Payer: Self-pay | Admitting: Family Medicine

## 2020-01-30 ENCOUNTER — Other Ambulatory Visit: Payer: Self-pay

## 2020-01-30 NOTE — Patient Outreach (Signed)
[facility]) Care Management Chronic Special Needs Program  01/30/2020  Name: [name] DOB: 1950/10/30  MRN: [id number]  [name] is enrolled in a chronic special needs plan for Heart Failure. Chronic Care Management Coordinator telephoned client to review health risk assessment and to update individualized care plan. RNCM reviewed the chronic care management program, importance of client participation, and taking their care plan to all provider appointments and inpatient facilities.    Subjective: client lives alone. Client states since the loss of his wife over a year ago that he has not been very active in self management of disease processes.   Client reports he takes his medications as prescribed, but does not have Trulicity and has not taken this in about three weeks because of cost. Client reports he went into the donut hole last year and may be in it now, but is unsure. Client is receptive to pharmacy referral for medication assistance.  Client reports he has not fallen, but has to be careful to change position slowly due to balance. Client states he can increase muscle strength and tone on his on and is not receptive to options such as physical therapy at this time.    PHQ2 6 and PHQ9 12. 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 Client states that is all he does . He reports he has not been interested in going places and doing other things. States. He states he has supportive children. They visit him daily and try to encourage client to socialize more. They shop for groceries for him and assist him as needed. However, client states he is interested in going places and meeting people and doing more things. Client is receptive to social work referral.   History of diabetes Hemoglobin A1C 10 on 01/07/2020. Client reports he is taking his medications as prescribed, but has not had Trulicity in about 3  weeks. Client reports primarily eats sandwiches and soup. Client states he would like to improve his health and is receptive to referral to health coach.  Goals Addressed            This Visit's Progress   .  Acknowledge receipt of Advanced Directive package (pt-stated)   On track    Discussed advance care planning. Emmi education provided: "Advance directives". Please review and plan to discuss with RN during next telephonic assessment. RN provided Advance Directive packet via mail to client per request. Plan to have Advance Directives (Living Will and POA) forms notarized and witnessed.    . Client verbalize knowledge of Heart Failure disease self management within the next 6 months.   On track    Signs and symptoms of heart failure exacerbation reviewed. Client voiced understanding. Review [employer] calendar sent in the mail for Heart Failure information. It is important to weigh daily and write down weights. Pay attention to your body if you have any shortness of breath, swelling in your feet, ankles, legs or your waistband get tight. Take medications as prescribed by your provider. Discussed the importance of monitoring amount of salt in your meal plan.  Emmi education provided on "DASH diet". Review and plan to discuss with RN during next telephonic assessment.     . Client will report no worsening of symptoms related to heart disease within the next 6 months   On track    Notify provider for symptoms of chest pain, sweating, nausea/vomiting,  irregular heartbeat, palpitations, rapid heart rate, shortness of breath, dizziness or fainting. Call 911 for severe symptoms of chest pain or shortness of breath. Take medications as prescribed. Follow a low salt meal plan, limit or avoid drinks with alcohol. Follow up with your cardiologist (heart doctor) for yearly visits. Emmi education provided on "heart disease in diabetics". Review and plan to discuss with RN during next  telephonic assessment.    . Client will report weighing and recording weights daily within 6 months. (pt-stated)   Not on track    Discussed the importance of daily weights in the management of congestive heart failure. You have said you will begin daily weights. Start by increasing to three times/week and work your way up to daily. Review [employer] calendar sent in the mail for Heart failure information.    . COMPLETED: Client will verbalize knowledge of diabetes self-management as evidenced by Hgb A1C <7 or as defined by provider.   Not on track    Hemoglobin A1C 10 on 01/07/2020. Duplicate goal, therefore, RNCM will complete this goal.    . HEMOGLOBIN A1C < 7.0       Hemoglobin A1C 10 on 01/07/2020. Have your A1C checked every 6 months if you are at goal or every 3 months if you are not at goal. Ask your provider what is your target A1C goal? Ask your provider what is your target blood sugar range? Plan to eat low carbohydrate and low salt meals, watch portion sizes and avoid sugar sweetened drinks. Review [employer] calendar sent in the mail for Diabetes action plan. Reviewed Health Coach counseling benefit provided by [employer] to assist you with achieving your goals. RN place referral to [place] Increase your exercise/activity if you are able to . Follow doctor recommendations. Emmi education provided on "diabetes diet". Please review and plan to discuss with RN during next telephonic assessment.    Marland Kitchen LIFESTYLE - DECREASE FALLS RISK   On track    Discussed importance of maintaining muscle tone and strength. Continue to change positions slowly As discussed, speak with your daughter about obtaining a Tub Bench. You may find them at your local [employer] or medical supply store. Inside your home: don't use throw rugs, use plenty of lighting, keep walkways clear of clutter, and remove tripping hazards such as cords.  Consider  installing grab bars in the hallway and grab bars in shower or tub. [facility] [place] can provide more information at [phone]. Review exercise program activity booklet mailed to you by [employer].     . Maintain timely refills of diabetic medication as prescribed within the year .   On track    Medications reviewed with client. It is important to take your medications as prescribed. Please continue to communicate with Clinical Pharmacist at [facility] as they are there to also help you achieve your goals. RN made Pharmacy referral for medication assistance (Trulicity).      . COMPLETED: Obtain Annual Eye (retinal)  Exam        Per chart review, last eye exam done 12/11/2019    . COMPLETED: Obtain Annual Foot Exam       Per chart review foot exam completed on 01/21/2020 and 03/19/2019.    Marland Kitchen Obtain annual screen for micro albuminuria (urine) , nephropathy (kidney problems)   On track    Last noted per review of chart-completed 02/21/17.  Goal renewed 2021: Diabetes can affect your kidneys.  It is important for your doctor to check your urine at least once a year. These test show how your kidneys are working.     Clydene Pugh Hemoglobin A1C at least 2 times per year   On track    Per review of chart completed  10/14/2018; 06/21/2019  Goal renewed 2021: Last done 01/07/2020 Discussed importance of consistent follow up with provider for exams and lab work. Continue to keep your follow up appointments with your provider and have lab work completed as recommended.     . Visit Primary Care Provider or Endocrinologist at least 2 times per year    On track    It is important to follow up with provider for recommended labs, procedures and medication refills. Complete your Annual Wellness visit every year.       Program changed from heart failure to diabetes, as client has not had any exacerbations and heart failure seem stable. hemoglobine A1C 10.     Plan: RNCM will send updated care plan to client; send updated care plan to primary care provider. RNCM will outreach within the next 6 months. Referral to health coach, referral to social work, send [employer] calendar, send advance directive packet.     Kathyrn Sheriff, RN, MSN, [facility] Chronic Care Management Coordinator [facility] [phone]

## 2020-01-31 NOTE — Patient Outreach (Signed)
Received a Pharmacy referral for a [employer] patient. Sent the referral to [facility]   Ticket saved successfully with Tracking ID: [id number]

## 2020-02-04 ENCOUNTER — Encounter: Payer: Self-pay | Admitting: *Deleted

## 2020-02-04 ENCOUNTER — Other Ambulatory Visit: Payer: Self-pay | Admitting: *Deleted

## 2020-02-04 NOTE — Patient Outreach (Signed)
Notes from [facility] Pharmacist Member does not qualify for LIS Advised his Novolin will be free, even in gap because he is on CSNP plan Completed [employer] PAP application for Trulicity-see attached and mailed to member Advised to call if questions 02/03/2020

## 2020-02-04 NOTE — Patient Outreach (Signed)
[facility]) Care Management  02/04/2020  [name] 1951-01-02 [id number]   CSW made an initial attempt to try and contact patient today to perform the initial phone assessment, as well as assess and assist with social work needs and services, without success.  A HIPAA compliant message was left for patient on voicemail.  CSW is currently awaiting a return call.  CSW will make a second outreach attempt within the next 3-4 business days, if a return call is not received from patient in the meantime.  CSW will also mail an Outreach Letter to patient's home requesting that patient contact CSW if patient is interested in receiving social work services through CSW with Chief Executive Officer.  Danford Bad, BSW, MSW, LCSW  Licensed Restaurant manager, [employer] [facility]  Mailing [place] [address] [place], [place] [address] Physical Address-[address], [place], [place] [address] Toll Free Main # [phone] Fax # [phone] Cell # [phone]  Office # [phone] [email or web address]

## 2020-02-06 ENCOUNTER — Ambulatory Visit: Payer: Self-pay | Admitting: *Deleted

## 2020-02-07 ENCOUNTER — Other Ambulatory Visit: Payer: Self-pay | Admitting: *Deleted

## 2020-02-07 DIAGNOSIS — H34812 Central retinal vein occlusion, left eye, with macular edema: Secondary | ICD-10-CM | POA: Diagnosis not present

## 2020-02-07 DIAGNOSIS — E113393 Type 2 diabetes mellitus with moderate nonproliferative diabetic retinopathy without macular edema, bilateral: Secondary | ICD-10-CM | POA: Diagnosis not present

## 2020-02-07 DIAGNOSIS — H348112 Central retinal vein occlusion, right eye, stable: Secondary | ICD-10-CM | POA: Diagnosis not present

## 2020-02-07 DIAGNOSIS — H35372 Puckering of macula, left eye: Secondary | ICD-10-CM | POA: Diagnosis not present

## 2020-02-07 NOTE — Patient Outreach (Signed)
[facility] Care Management  02/07/2020  DEKARI BURES Apr 03, 1951 [id number]   CSW made a second attempt to try and contact patient today to perform phone assessment, as well as assess and assist with social work needs and services, without success.  A HIPAA compliant message was left for patient on voicemail.  CSW continues to await a return call.  CSW will make a third outreach attempt within the next 3-4 business days, if a return call is not received from patient in the meantime.    Danford Bad, BSW, MSW, LCSW  Licensed Restaurant manager, [employer] [facility]  Mailing [place] [address] [place], [place] [address] Physical Address-[address], [place], [place] [address] Toll Free Main # [phone] Fax # [phone] Cell # [phone]  Office # [phone] [email or web address]

## 2020-02-11 ENCOUNTER — Telehealth: Payer: HMO

## 2020-02-11 NOTE — Chronic Care Management (AMB) (Deleted)
Chronic Care Management Pharmacy  Name: [name]  MRN: [id number] DOB: 1950/09/13  Chief Complaint/ HPI  [name],  69 y.o., male presents for their Follow-Up CCM visit with the clinical pharmacist via telephone.  PCP : Jinny Sanders, MD  Their chronic conditions addressed today include: depression, diabetes, hypertension, CAD, hyperlipidemia   Patient concerns: very depressed, naps during the day, unable to sleep at night until 4-5 AM  Office Visits: 3/76/28: out of Trulicity for 1 month, increase Trulicity to 1.5 mg weekly, restart Crestor, (off antifungal), purchase BP cuff and monitor at home  Consult Visit: none since last CCM visit on 10/09/19  Allergies  Allergen Reactions  . Atorvastatin Other (See Comments)    Body aches   Medications: Outpatient Encounter Medications as of 02/11/2020  Medication Sig  . aspirin 81 MG EC tablet Take 1 tablet (81 mg total) by mouth daily.  . Blood Glucose Calibration (ONETOUCH VERIO) High SOLN   . Blood Glucose Monitoring Suppl (ONETOUCH VERIO FLEX SYSTEM) w/Device KIT   . carvedilol (COREG) 6.25 MG tablet Take 1 tablet (6.25 mg total) by mouth daily.  . clopidogrel (PLAVIX) 75 MG tablet TAKE 1 TABLET BY MOUTH ONCE DAILY WITH BREAKFAST  . Continuous Blood Gluc Receiver (FREESTYLE LIBRE 14 DAY READER) DEVI 1 Device by Does not apply route every 14 (fourteen) days.  . Continuous Blood Gluc Sensor (FREESTYLE LIBRE 14 DAY SENSOR) MISC 1 Device by Does not apply route every 14 (fourteen) days.  . Dulaglutide (TRULICITY) 1.5 BT/5.1VO SOPN Inject 1.5 mg into the skin once a week. (Patient not taking: Reported on 01/30/2020)  . ezetimibe (ZETIA) 10 MG tablet Take 1 tablet (10 mg total) by mouth daily.  . furosemide (LASIX) 40 MG tablet Take 1 tablet (40 mg total) by mouth daily. Can take a 2nd daily dose as needed.  . insulin NPH-regular Human (NOVOLIN 70/30) (70-30) 100 UNIT/ML injection Inject 70 units in the morning and 40 units at  bedtime.  . isosorbide mononitrate (IMDUR) 30 MG 24 hr tablet Take 1 tablet (30 mg total) by mouth 2 (two) times daily.  . Lancets (ONETOUCH DELICA PLUS [id number]) [place]   . losartan (COZAAR) 50 MG tablet Take 1 tablet (50 mg total) by mouth daily.  . nitroGLYCERIN (NITROSTAT) 0.4 MG SL tablet DISSOLVE 1 TABLET UNDER TONGUE AS NEEDEDFOR CHEST PAIN. MAY REPEAT 5 MINUTES APART 3 TIMES IF NEEDED  . ONETOUCH VERIO test strip   . potassium chloride SA (K-DUR) 20 MEQ tablet Take 1 tablet (20 mEq total) by mouth daily.  . rosuvastatin (CRESTOR) 40 MG tablet Take 20 mg by mouth daily.  Marland Kitchen venlafaxine XR (EFFEXOR-XR) 150 MG 24 hr capsule TAKE 1 CAPSULE BY MOUTH ONCE DAILY WITH BREAKFAST. TAKE WITH 75 MG DOSE TO EQUAL 225MG DAILY  . venlafaxine XR (EFFEXOR-XR) 75 MG 24 hr capsule TAKE 1 CAPSULE BY MOUTH ONCE DAILY. TAKEIN ADDITION TO THE 150 MG CAPSULE TO EQUAL A TOTAL DOSE OF 225 MG DAILY   No facility-administered encounter medications on file as of 02/11/2020.   Current Diagnosis/Assessment: Goals    .   Acknowledge receipt of Advanced Directive package (pt-stated)      Discussed advance care planning. Emmi education provided: "Advance directives". Please review and plan to discuss with RN during next telephonic assessment. RN provided Advance Directive packet via mail to client per request. Plan to have Advance Directives (Living Will and POA) forms notarized and witnessed.    .  Client  verbalize knowledge of Heart Failure disease self management within the next 6 months.      Signs and symptoms of heart failure exacerbation reviewed. Client voiced understanding. Review [employer] calendar sent in the mail for Heart Failure information. It is important to weigh daily and write down weights. Pay attention to your body if you have any shortness of breath, swelling in your feet, ankles, legs or your waistband get tight. Take medications as prescribed by your provider. Discussed the importance  of monitoring amount of salt in your meal plan.  Emmi education provided on "DASH diet". Review and plan to discuss with RN during next telephonic assessment.     .  Client will report no worsening of symptoms related to heart disease within the next 6 months      Notify provider for symptoms of chest pain, sweating, nausea/vomiting, irregular heartbeat, palpitations, rapid heart rate, shortness of breath, dizziness or fainting. Call 911 for severe symptoms of chest pain or shortness of breath. Take medications as prescribed. Follow a low salt meal plan, limit or avoid drinks with alcohol. Follow up with your cardiologist (heart doctor) for yearly visits. Emmi education provided on "heart disease in diabetics". Review and plan to discuss with RN during next telephonic assessment.    .  Client will report weighing and recording weights daily within 6 months. (pt-stated)      Discussed the importance of daily weights in the management of congestive heart failure. You have said you will begin daily weights. Start by increasing to three times/week and work your way up to daily. Review [employer] calendar sent in the mail for Heart failure information.    Marland Kitchen  HEMOGLOBIN A1C < 7.0      Hemoglobin A1C 10 on 01/07/2020. Have your A1C checked every 6 months if you are at goal or every 3 months if you are not at goal. Ask your provider what is your target A1C goal? Ask your provider what is your target blood sugar range? Plan to eat low carbohydrate and low salt meals, watch portion sizes and avoid sugar sweetened drinks. Review [employer] calendar sent in the mail for Diabetes action plan. Reviewed Health Coach counseling benefit provided by [employer] to assist you with achieving your goals. RN place referral to [place] Increase your exercise/activity if you are able to . Follow doctor recommendations. Emmi education provided on "diabetes diet".  Please review and plan to discuss with RN during next telephonic assessment.    Marland Kitchen  LIFESTYLE - DECREASE FALLS RISK      Discussed importance of maintaining muscle tone and strength. Continue to change positions slowly As discussed, speak with your daughter about obtaining a Tub Bench. You may find them at your local [employer] or medical supply store. Inside your home: don't use throw rugs, use plenty of lighting, keep walkways clear of clutter, and remove tripping hazards such as cords.  Consider installing grab bars in the hallway and grab bars in shower or tub. [employer] [facility] [place] can provide more information at [phone]. Review exercise program activity booklet mailed to you by [employer].     .  Maintain timely refills of diabetic medication as prescribed within the year .      Medications reviewed with client. It is important to take your medications as prescribed. Please continue to communicate with Clinical Pharmacist at [facility] at [facility] as they are there to also help you achieve your goals.  RN made Pharmacy referral for medication assistance (Trulicity).      .  Obtain annual screen for micro albuminuria (urine) , nephropathy (kidney problems)      Last noted per review of chart-completed 02/21/17.  Goal renewed 2021: Diabetes can affect your kidneys. It is important for your doctor to check your urine at least once a year. These test show how your kidneys are working.     Illa Level Hemoglobin A1C at least 2 times per year      Per review of chart completed  10/14/2018; 06/21/2019  Goal renewed 2021: Last done 01/07/2020 Discussed importance of consistent follow up with provider for exams and lab work. Continue to keep your follow up appointments with your provider and have lab work completed as recommended.     .  Pharmacy Care Plan      Current Barriers:  . Chronic Disease Management support, education, and care coordination needs  related to: hypertension, hyperlipidemia, congestive heart failure, coronary artery disease, GERD, diabetes, diabetic retinopathy, osteoarthritis, major depressive disorder  Pharmacist Clinical Goal(s):  Marland Kitchen Improve mood and fatigue. We discussed the following goals today: o Continue to get outside by mowing 2-3 lawns per week. o Attend church this Sunday to be around your community. o Walk to your mailbox daily. o Participate in one additional activity outside of your home weekly. o Reduce caffeine intake and limit daytime napping. . Maintain blood pressure within goal of < 140/90 mmHg. Recommend picking up a blood monitor from [employer]. The price is $35. Let me know if this is affordable for you. Check your blood pressure daily in the morning before medications and keep a written log of your readings. . Improve diabetes control with goal of A1c < 7%. Begin using freestyle Libre sensor today.  Dr. Diona Browner increased your Trulicity to 1.5 mg weekly. I have contacted [facility] so they know your correct dose. Please finish out the 0.75 mg dose by taking 2 injections next Thursday 12/05/19, then begin one 1.5 mg injection weekly. Pharmacist will call to follow up on readings in one week.  . Prevent heart failure exacerbations. Recommend checking weight daily, if weight gain of more than 3 pounds in one day or 5 pounds in one week, call your doctor. . Achieve cholesterol goal of LDL < 70 mg/dL. I have called the rosuvastatin in to [facility] and it will be delivered with your next bubble pack.   Interventions: . Comprehensive medication review performed. . Coordinate adherence packaging and medication delivery through [facility].  Patient Self Care Activities:  . Self-administers medications as prescribed . Self monitors vital signs, including blood pressure, heart rate, blood glucose  Please see past updates related to this goal by clicking on the "Past Updates" button  in the selected goal       .  Visit Primary Care Provider or Endocrinologist at least 2 times per year       It is important to follow up with provider for recommended labs, procedures and medication refills. Complete your Annual Wellness visit every year.       Diabetes  Recent Relevant Labs: Lab Results  Component Value Date/Time   HGBA1C 10.0 (H) 01/07/2020 07:40 AM   HGBA1C 10.1 (A) 10/15/2019 03:04 PM   HGBA1C 10.5 (H) 06/21/2019 07:57 AM   HGBA1C 9.6 10/02/2018 12:00 AM   MICROALBUR <0.7 02/21/2017 01:57 PM   MICROALBUR 0.8 02/16/2016 08:24 AM    Checking BG: None Patient is currently  uncontrolled on the following medications:   Dulaglutide 0.75 mg/0.5 mL - Inject 1.5 mg once weekly  Novolin 70/30 - Inject 70 units in the morning and 40 units at bedtime (takes up to 15 minutes after meals)   Last diabetic eye exam:  Lab Results  Component Value Date/Time   HMDIABEYEEXA Retinopathy (A) 12/11/2019 12:00 AM    Last diabetic foot exam:  Lab Results  Component Value Date/Time   HMDIABFOOTEX done 03/19/2019 12:00 AM    We discussed: patient continues medications as prescribed but has very little energy and has not started back on freestyle Libre; confirms he would rather use Libre than traditional fingerstick method and will start the [place] today with plan for a telephone follow up call in 1 week to assess readings; due to confusion with pharmacies, pt has not started increased dose of Trulicity. [place] today and set up patient for medication sync and bubble packaging. Will have patient take Trulicity 6.83 mg this Thursday, then 2 injections the following week, then pick up Trulicity 1.5 mg (refilled 0.75 mg dose last week).  Exercise: limited due to fatigue, fear of falls Diet: tries to limit sweets  Plan: [place] today. Pharmacist will call in 1 week to reassess. Begin taking 2 injections of Trulicity next Thursday, 12/05/19, then pick up higher  dose from pharmacy.   Hyperlipidemia  Lipid Panel     Component Value Date/Time   CHOL 148 01/07/2020 0740   TRIG 97.0 01/07/2020 0740   HDL 36.20 (L) 01/07/2020 0740   CHOLHDL 4 01/07/2020 0740   VLDL 19.4 01/07/2020 0740   LDLCALC 92 01/07/2020 0740   LDLDIRECT 174.0 10/19/2017 1600   LDL goal < 70  Patient has failed these meds in past: atorvastatin Patient is currently uncontrolled on the following medications:   Rosuvastatin 40 mg - once daily - pt stopped?? Per pcp try 1/2 tab 01/21/20 as well as coq10  Ezetimibe 10 mg - once daily ([place])  We discussed: Pt has not yet started rosuvastatin, called [facility] today to ensure they have rx and they will put it in the bubble packaging for next delivery.  Plan: Continue current medications. Recommend repeat lipid panel in 4-12 weeks.   Hypertension   Office blood pressures are  BP Readings from Last 3 Encounters:  10/15/19 (!) 150/90  07/09/19 (!) 146/82  03/19/19 130/66   CMP Latest Ref Rng & Units 06/21/2019 03/12/2019 10/18/2018  Glucose 70 - 99 mg/dL 232(H) 362(H) 197(H)  BUN 6 - 23 mg/dL _0 Creatinine 0.40 - 1.50 mg/dL 1.03 1.07 1.06  Sodium 135 - 145 mEq/L 140 139 139  Potassium 3.5 - 5.1 mEq/L 4.1 4.8 3.5  Chloride 96 - 112 mEq/L 101 102 103  CO2 19 - 32 mEq/L _1 Calcium 8.4 - 10.5 mg/dL 9.2 8.8 8.7  Total Protein 6.0 - 8.3 g/dL 7.0 6.7 7.0  Total Bilirubin 0.2 - 1.2 mg/dL 0.5 0.5 0.4  Alkaline Phos 39 - 117 U/L 86 85 105  AST 0 - 37 U/L _2 ALT 0 - 53 U/L _3 Patient is currently uncontrolled on the following medications:   Carvedilol 6.25 mg - once daily ([place])  Losartan 50 mg - once daily   Furosemide 40 mg - once daily (second dose is PRN)  Patient has failed these meds in the past: none reported (does not have a monitor); not taking potassium anymore, unsure when  it was discontinued, but will recheck potassium at next office visit   Plan: Continue current  medications. Recommend purchasing a home blood pressure monitor with arm cuff. Checked pricing at [employer], $35. Recommend labs with next office visit to assess potassium.   MDD  Symptoms: energy remains low, but sleep schedule is increasingly more erratic; previously going to sleep at 11PM-1AM now unable to fall asleep until 4-5 AM, due to depressed mood, low energy, no longer enjoys watching TV and hasn't checked his mail in more than a week Sleep hygiene: taking multiple naps through the day, drinks caffeine (diet soda) throughout the day  Patient has failed these meds in past: none reported Patient is currently uncontrolled on the following medications:   Venlafaxine XR 75 mg and 150 mg - take total dose of 225 mg once daily  Tylenol PM - PRN for sleep   We discussed: pt is aware not leaving the home, not doing the things he used to do, and mourning his wife all contribute to current mood; completed PHQ-9 today (score:15--> moderately severe depression); denies any thoughts of harming himself, reports he has faith that gives him hope; would like to stay on venlafaxine as he believes it helps  Goal setting: mow 2-3 lawns per week, go to church this Sunday on Easter (hasn't been in over a year and would like to return), check mail daily, reduce caffeine intake and limit napping; participate in 1 additional activity outside of the home weekly  Plan: Continue current medications; Follow up with pharmacist monthly for accountability with goal setting.   Medication Management: OTCs: no longer taking anything for acid reflux, taking vitamin D 1000 IU - 2 a day, Nepro drink 1 daily, tylenol PM PRN  Pharmacy: [facility]; currently picks up or delivery --> recommend sync meds and packaging (would like me to call [facility] and get bubble packs and med sync in place - called and completed today) Adherence: Family fills pillbox once weekly; history of non-adherence due to cost; Takes  morning medications around 11 AM (with breakfast - oatmeal, eggs, cornflakes, takes the rest at supper (evening meal); Thursdays - Trulicity Cost concerns: PAP - Trulicity, Novolin 70, 40 units (currently free with insurance until he goes into donut hole; we will reapply for PAP prior to donut hole this year)   Debbora Dus, PharmD Clinical Pharmacist [facility] at [facility] [phone]

## 2020-02-13 ENCOUNTER — Other Ambulatory Visit: Payer: Self-pay | Admitting: *Deleted

## 2020-02-13 NOTE — Patient Outreach (Signed)
[facility]) Care Management  02/13/2020  NOLEN LINDAMOOD [birth year] [id number]   CSW made a third attempt to try and contact patient today to perform the phone assessment, as well as assess and assist with social work needs and services, without success.  A HIPAA compliant message was left for patient on voicemail.  CSW continues to await a return call.  CSW will make a fourth and final outreach attempt within the next four weeks, if a return call is not received from patient in the meantime.    Danford Bad, BSW, MSW, LCSW  Licensed Restaurant manager, [employer] [facility]  Mailing [place] [address] [place], [place] [address] Physical Address-[address], [place], [place] [address] Toll Free Main # [phone] Fax # [phone] Cell # [phone]  Office # [phone] [email or web address]

## 2020-03-06 ENCOUNTER — Ambulatory Visit: Payer: HMO

## 2020-03-06 ENCOUNTER — Other Ambulatory Visit: Payer: Self-pay

## 2020-03-06 ENCOUNTER — Telehealth: Payer: Self-pay | Admitting: Family Medicine

## 2020-03-06 DIAGNOSIS — E1339 Other specified diabetes mellitus with other diabetic ophthalmic complication: Secondary | ICD-10-CM

## 2020-03-06 DIAGNOSIS — IMO0002 Reserved for concepts with insufficient information to code with codable children: Secondary | ICD-10-CM

## 2020-03-06 DIAGNOSIS — E785 Hyperlipidemia, unspecified: Secondary | ICD-10-CM

## 2020-03-06 DIAGNOSIS — F331 Major depressive disorder, recurrent, moderate: Secondary | ICD-10-CM

## 2020-03-06 NOTE — Telephone Encounter (Signed)
Placed in dr Daphine Deutscher in basket

## 2020-03-06 NOTE — Telephone Encounter (Signed)
Completed in outbox.

## 2020-03-06 NOTE — Telephone Encounter (Signed)
Patient dropped off form for patient assistance for his medications.  Form is in rx tower.

## 2020-03-06 NOTE — Patient Instructions (Signed)
Dear [name],  Below is a summary of the goals we discussed during our follow up appointment on March 06, 2020. Please contact me anytime with questions or concerns.   Visit Information  Goals Addressed            This Visit's Progress   . Pharmacy Care Plan       CARE PLAN ENTRY (see longitudinal plan of care for additional care plan information)  Current Barriers:  . Chronic Disease Management support, education, and care coordination needs related to Hyperlipidemia, Diabetes, and Depression  Hyperlipidemia Lab Results  Component Value Date/Time   LDLCALC 92 01/07/2020 07:40 AM   LDLDIRECT 174.0 10/19/2017 04:00 PM .  Pharmacist Clinical Goal(s): o Over the next 3 months, patient will work with PharmD and providers to achieve LDL goal < 70 . Current regimen:  o Ezetimibe 10 mg - 1 tablet daily  . Interventions: o Retry rosuvastatin 40 mg - 1 tablet every other day . Patient self care activities - Over the next 3 months, patient will: o Resume rosuvastatin 40 mg - 1 tablet every other day. o Call if you have any joint pain.   Diabetes Lab Results  Component Value Date/Time   HGBA1C 10.0 (H) 01/07/2020 07:40 AM   HGBA1C 10.1 (A) 10/15/2019 03:04 PM   HGBA1C 10.5 (H) 06/21/2019 07:57 AM   HGBA1C 9.6 10/02/2018 12:00 AM .  Pharmacist Clinical Goal(s): o Over the next 3 months, patient will work with PharmD and providers to achieve A1c goal <7% . Current regimen:  o Novolin 70/30 - Inject 70 units in the morning and 40 units at evening meal . Interventions: o Recommend taking insulin 30-45 minutes before breakfast and supper to avoid low blood glucose . Patient self care activities - Over the next 3 months, patient will: o Check blood sugar daily with Freestyle Libre continuous glucose monitor o Contact provider with any episodes of hypoglycemia o Bring patient assistance forms to office for Trulicity o Begin taking your insulin 30 minutes before you eat instead of  after meals o Try limiting your carbohydrates at each meal to 45-60 grams and 15-30 grams for snacks  Depression . Pharmacist Clinical Goal(s) o Over the next 3 months, patient will work with PharmD and providers to improve depressed mood . Current regimen:  o Venlafaxine XR 75 mg and 150 mg - take total dose of 225 mg once daily . Interventions: o Recommend setting a bedtime routine and sleeping between the hours of 10 PM and 8 AM.  . Patient self care activities - Over the next 3 months, patient will: o Continue to mow 2-3 lawns per week. o Consider attending your church again. o Walk to your mailbox daily. o Try to avoid daytime naps and move bedtime up to before midnight. Avoid TV after 11 PM. Avoid caffeine after lunch.   Please see past updates related to this goal by clicking on the "Past Updates" button in the selected goal        Patient verbalizes understanding of instructions provided today.   Telephone follow up appointment with pharmacy team member scheduled for: 06/08/20 at 11:00 AM (telephone)  [name], PharmD Clinical Pharmacist  Primary Care at [facility] [phone]   Carbohydrate Counting for Diabetes Mellitus, Adult  Carbohydrate counting is a method of keeping track of how many carbohydrates you eat. Eating carbohydrates naturally increases the amount of sugar (glucose) in the blood. Counting how many carbohydrates you eat helps keep  your blood glucose within normal limits, which helps you manage your diabetes (diabetes mellitus). It is important to know how many carbohydrates you can safely have in each meal. This is different for every person. A diet and nutrition specialist (registered dietitian) can help you make a meal plan and calculate how many carbohydrates you should have at each meal and snack. Carbohydrates are found in the following foods:  Grains, such as breads and cereals.  Dried beans and soy products.  Starchy vegetables,  such as potatoes, peas, and corn.  Fruit and fruit juices.  Milk and yogurt.  Sweets and snack foods, such as cake, cookies, candy, chips, and soft drinks. How do I count carbohydrates? There are two ways to count carbohydrates in food. You can use either of the methods or a combination of both. Reading "Nutrition Facts" on packaged food The "Nutrition Facts" list is included on the labels of almost all packaged foods and beverages in the [place] It includes:  The serving size.  Information about nutrients in each serving, including the grams (g) of carbohydrate per serving. To use the "Nutrition Facts":  Decide how many servings you will have.  Multiply the number of servings by the number of carbohydrates per serving.  The resulting number is the total amount of carbohydrates that you will be having. Learning standard serving sizes of other foods When you eat carbohydrate foods that are not packaged or do not include "Nutrition Facts" on the label, you need to measure the servings in order to count the amount of carbohydrates:  Measure the foods that you will eat with a food scale or measuring cup, if needed.  Decide how many standard-size servings you will eat.  Multiply the number of servings by 15. Most carbohydrate-rich foods have about 15 g of carbohydrates per serving. ? For example, if you eat 8 oz (170 g) of strawberries, you will have eaten 2 servings and 30 g of carbohydrates (2 servings x 15 g = 30 g).  For foods that have more than one food mixed, such as soups and casseroles, you must count the carbohydrates in each food that is included. The following list contains standard serving sizes of common carbohydrate-rich foods. Each of these servings has about 15 g of carbohydrates:   hamburger bun or  English muffin.   oz (15 mL) syrup.   oz (14 g) jelly.  1 slice of bread.  1 six-inch tortilla.  3 oz (85 g) cooked rice or pasta.  4 oz (113 g) cooked dried  beans.  4 oz (113 g) starchy vegetable, such as peas, corn, or potatoes.  4 oz (113 g) hot cereal.  4 oz (113 g) mashed potatoes or  of a large baked potato.  4 oz (113 g) canned or frozen fruit.  4 oz (120 mL) fruit juice.  4-6 crackers.  6 chicken nuggets.  6 oz (170 g) unsweetened dry cereal.  6 oz (170 g) plain fat-free yogurt or yogurt sweetened with artificial sweeteners.  8 oz (240 mL) milk.  8 oz (170 g) fresh fruit or one small piece of fruit.  24 oz (680 g) popped popcorn. Example of carbohydrate counting Sample meal  3 oz (85 g) chicken breast.  6 oz (170 g) brown rice.  4 oz (113 g) corn.  8 oz (240 mL) milk.  8 oz (170 g) strawberries with sugar-free whipped topping. Carbohydrate calculation 1. Identify the foods that contain carbohydrates: ? Rice. ? Corn. ? Milk. ?  Strawberries. 2. Calculate how many servings you have of each food: ? 2 servings rice. ? 1 serving corn. ? 1 serving milk. ? 1 serving strawberries. 3. Multiply each number of servings by 15 g: ? 2 servings rice x 15 g = 30 g. ? 1 serving corn x 15 g = 15 g. ? 1 serving milk x 15 g = 15 g. ? 1 serving strawberries x 15 g = 15 g. 4. Add together all of the amounts to find the total grams of carbohydrates eaten: ? 30 g + 15 g + 15 g + 15 g = 75 g of carbohydrates total. Summary  Carbohydrate counting is a method of keeping track of how many carbohydrates you eat.  Eating carbohydrates naturally increases the amount of sugar (glucose) in the blood.  Counting how many carbohydrates you eat helps keep your blood glucose within normal limits, which helps you manage your diabetes.  A diet and nutrition specialist (registered dietitian) can help you make a meal plan and calculate how many carbohydrates you should have at each meal and snack. This information is not intended to replace advice given to you by your health care provider. Make sure you discuss any questions you have with  your health care provider. Document Revised: 03/09/2017 Document Reviewed: 01/27/2016 Elsevier Patient Education  2020 [employer].

## 2020-03-06 NOTE — Chronic Care Management (AMB) (Signed)
Chronic Care Management Pharmacy  Name: GARRICK MIDGLEY  MRN: [id number] DOB: 07-27-1951  Chief Complaint/ HPI  [name],  69 y.o., male presents for their Follow-Up CCM visit with the clinical pharmacist via telephone.  PCP : Jinny Sanders, MD  Their chronic conditions addressed today include: depression, diabetes, hypertension, CAD, hyperlipidemia   Missed CCM follow up appt on 02/11/20, rescheduled for today.  Office Visits:  01/21/20: PCP visit - DM on insulin 70/30 70 units AM, 40-50 units at night, denies taking Trulicity, uses libre on and off, A1c 18%, restart Trulicity at 1.5 mg daily; pt stopped Crestor due to arm and should pain, try 1/2 tablet daily with CoQ10, HTN controlled   11/27/19: CCM visit - assist with adherence/bubble packs through [place], increase Trulicity to 1.5 (pt has not yet increased)  10/15/19: PCP visit - out of Trulicity for 1 month, increase Trulicity to 1.5 mg weekly, restart Crestor, (off antifungal), purchase BP cuff and monitor at home  Consult Visit:   12/24/19: Cardiology - 12 month follow up, back on statin, thinks he is retaining fluid, takes Lasix 40 mg daily and extra tablet PRN. GERD, stable on PPI, HTN controlled, no med changes, RTC 6 months   Allergies  Allergen Reactions  . Atorvastatin Other (See Comments)    Body aches   Medications: Outpatient Encounter Medications as of 03/06/2020  Medication Sig  . aspirin 81 MG EC tablet Take 1 tablet (81 mg total) by mouth daily.  . Blood Glucose Calibration (ONETOUCH VERIO) High SOLN   . Blood Glucose Monitoring Suppl (ONETOUCH VERIO FLEX SYSTEM) w/Device KIT   . carvedilol (COREG) 6.25 MG tablet Take 1 tablet (6.25 mg total) by mouth daily.  . clopidogrel (PLAVIX) 75 MG tablet TAKE 1 TABLET BY MOUTH ONCE DAILY WITH BREAKFAST  . Continuous Blood Gluc Receiver (FREESTYLE LIBRE 14 DAY READER) DEVI 1 Device by Does not apply route every 14 (fourteen) days.  . Continuous Blood  Gluc Sensor (FREESTYLE LIBRE 14 DAY SENSOR) MISC 1 Device by Does not apply route every 14 (fourteen) days.  . Dulaglutide (TRULICITY) 1.5 EX/9.3ZJ SOPN Inject 1.5 mg into the skin once a week. (Patient not taking: Reported on 01/30/2020)  . ezetimibe (ZETIA) 10 MG tablet Take 1 tablet (10 mg total) by mouth daily.  . furosemide (LASIX) 40 MG tablet Take 1 tablet (40 mg total) by mouth daily. Can take a 2nd daily dose as needed.  . insulin NPH-regular Human (NOVOLIN 70/30) (70-30) 100 UNIT/ML injection Inject 70 units in the morning and 40 units at bedtime.  . isosorbide mononitrate (IMDUR) 30 MG 24 hr tablet Take 1 tablet (30 mg total) by mouth 2 (two) times daily.  . Lancets (ONETOUCH DELICA PLUS IRCVEL38B) [place]   . losartan (COZAAR) 50 MG tablet Take 1 tablet (50 mg total) by mouth daily.  . nitroGLYCERIN (NITROSTAT) 0.4 MG SL tablet DISSOLVE 1 TABLET UNDER TONGUE AS NEEDEDFOR CHEST PAIN. MAY REPEAT 5 MINUTES APART 3 TIMES IF NEEDED  . ONETOUCH VERIO test strip   . potassium chloride SA (K-DUR) 20 MEQ tablet Take 1 tablet (20 mEq total) by mouth daily.  . rosuvastatin (CRESTOR) 40 MG tablet Take 20 mg by mouth daily.  Mar[place] Kitchen venlafaxine XR (EFFEXOR-XR) 150 MG 24 hr capsule TAKE 1 CAPSULE BY MOUTH ONCE DAILY WITH BREAKFAST. TAKE WITH 75 MG DOSE TO EQUAL 225MG DAILY  . venlafaxine XR (EFFEXOR-XR) 75 MG 24 hr capsule TAKE 1 CAPSULE BY MOUTH ONCE DAILY.  TAKEIN ADDITION TO THE 150 MG CAPSULE TO EQUAL A TOTAL DOSE OF 225 MG DAILY   No facility-administered encounter medications on file as of 03/06/2020.   Current Diagnosis/Assessment: Goals    .  Pharmacy Care Plan      CARE PLAN ENTRY (see longitudinal plan of care for additional care plan information)  Current Barriers:  . Chronic Disease Management support, education, and care coordination needs related to Hyperlipidemia, Diabetes, and Depression  Hyperlipidemia Lab Results  Component Value Date/Time   LDLCALC 92 01/07/2020 07:40 AM   LDLDIRECT  174.0 10/19/2017 04:00 PM .  Pharmacist Clinical Goal(s): o Over the next 3 months, patient will work with PharmD and providers to achieve LDL goal < 70 . Current regimen:  o Ezetimibe 10 mg - 1 tablet daily  . Interventions: o Retry rosuvastatin 40 mg - 1 tablet every other day . Patient self care activities - Over the next 3 months, patient will: o Resume rosuvastatin 40 mg - 1 tablet every other day. o Call if you have any joint pain.   Diabetes Lab Results  Component Value Date/Time   HGBA1C 10.0 (H) 01/07/2020 07:40 AM   HGBA1C 10.1 (A) 10/15/2019 03:04 PM   HGBA1C 10.5 (H) 06/21/2019 07:57 AM   HGBA1C 9.6 10/02/2018 12:00 AM .  Pharmacist Clinical Goal(s): o Over the next 3 months, patient will work with PharmD and providers to achieve A1c goal <7% . Current regimen:  o Novolin 70/30 - Inject 70 units in the morning and 40 units at evening meal . Interventions: o Recommend taking insulin 30-45 minutes before breakfast and supper to avoid low blood glucose . Patient self care activities - Over the next 3 months, patient will: o Check blood sugar daily with Freestyle Libre continuous glucose monitor o Contact provider with any episodes of hypoglycemia o Bring patient assistance forms to office for Trulicity o Begin taking your insulin 30 minutes before you eat instead of after meals o Try limiting your carbohydrates at each meal to 45-60 grams and 15-30 grams for snacks  Depression . Pharmacist Clinical Goal(s) o Over the next 3 months, patient will work with PharmD and providers to improve depressed mood . Current regimen:  o Venlafaxine XR 75 mg and 150 mg - take total dose of 225 mg once daily . Interventions: o Recommend setting a bedtime routine and sleeping between the hours of 10 PM and 8 AM.  . Patient self care activities - Over the next 3 months, patient will: o Continue to mow 2-3 lawns per week. o Consider attending your church again. o Walk to your mailbox  daily. o Try to avoid daytime naps and move bedtime up to before midnight. Avoid TV after 11 PM. Avoid caffeine after lunch.   Please see past updates related to this goal by clicking on the "Past Updates" button in the selected goal             Diabetes  Recent Relevant Labs: Lab Results  Component Value Date/Time   HGBA1C 10.0 (H) 01/07/2020 07:40 AM   HGBA1C 10.1 (A) 10/15/2019 03:04 PM   HGBA1C 10.5 (H) 06/21/2019 07:57 AM   HGBA1C 9.6 10/02/2018 12:00 AM   MICROALBUR <0.7 02/21/2017 01:57 PM   MICROALBUR 0.8 02/16/2016 08:24 AM    Checking BG: applied Libre last night, continues to use off and on  Fasting BG: this morning - 247, reports BG dropped (felt bad, did not check reading) in the middle of the night  and he ate a snack resulting in high AM reading Denies frequent hypoglycemia  Patient is currently uncontrolled on the following medications:   Novolin 70/30 - Inject 70 units in the morning and 40 units at evening meal   Up to date on eye and foot exam Not taking Trulicity - needs to bring in patient assistance forms for signing, reports he will bring it this afternoon 03/06/20 Adherence: Confirms taking Novolin 70 in the morning, 40 in the evening, up to 15 minutes after start of meal --> recommend taking 30-45 min before meals Diet - denies many sweets, eats [employer] often, loves bread; discussed carb counting Exercise: limited due to fatigue, fear of falls  Plan: Bring in patient assistance forms today. Begin taking Insulin 30-45 minutes before meals to prevent hypoglycemia. Continue to wear freestyle libre daily.   Hyperlipidemia  Lipid Panel     Component Value Date/Time   CHOL 148 01/07/2020 0740   TRIG 97.0 01/07/2020 0740   HDL 36.20 (L) 01/07/2020 0740   CHOLHDL 4 01/07/2020 0740   VLDL 19.4 01/07/2020 0740   LDLCALC 92 01/07/2020 0740   LDLDIRECT 174.0 10/19/2017 1600   LDL goal < 70  Patient has failed these meds in past: atorvastatin Patient is  currently uncontrolled on the following medications:   Rosuvastatin 40 mg - 1/2 tablet daily   Ezetimibe 10 mg - 1 tablet daily   Adherence: Reports tried reducing rosuvastatin 40 mg to 1/2 tablet daily as recommended by PCP 01/21/20 and experienced joint pain. Did not try CoQ10 with rosuvastatin. He stated he would like to try rosuvastatin again with 1 tablet every other day.   Plan: Restart rosuvastatin 40 mg - 1 tablet every other day.   Hypertension   CMP Latest Ref Rng & Units 01/07/2020 06/21/2019 03/12/2019  Glucose 70 - 99 mg/dL 211(H) 232(H) 362(H)  BUN 6 - 23 mg/dL 16 22 19   Creatinine 0.40 - 1.50 mg/dL 0.93 1.03 1.07  Sodium 135 - 145 mEq/L 142 140 139  Potassium 3.5 - 5.1 mEq/L 4.8 4.1 4.8  Chloride 96 - 112 mEq/L 104 101 102  CO2 19 - 32 mEq/L 30 30 29   Calcium 8.4 - 10.5 mg/dL 9.7 9.2 8.8  Total Protein 6.0 - 8.3 g/dL 7.5 7.0 6.7  Total Bilirubin 0.2 - 1.2 mg/dL 0.4 0.5 0.5  Alkaline Phos 39 - 117 U/L 82 86 85  AST 0 - 37 U/L 16 18 16   ALT 0 - 53 U/L 18 21 19    Office blood pressures are: BP Readings from Last 3 Encounters:  01/21/20 138/60  12/24/19 140/64  10/15/19 (!) 150/90   Patient is currently uncontrolled on the following medications:   Carvedilol 6.25 mg - 1 tablet daily  Losartan 50 mg - 1 tablet daily   Patient has failed these meds in the past: none reported Previously encouraged home BP monitoring, patient did not pick up BP cuff as recommended. Office visit BP readings have improved. Adherence: > 5 day gap for losartan refill, carvedilol timely --> previously worked with Fernand Parkins to start pill packs, but pt decided to continue vials due to desire to take out medications when he has a problem.  Plan: Continue current medications.   MDD   PHQ-9 (11/27/19): 15 moderately severe depression Symptoms: Continues to report low energy, lack of motivation to leave home. He only leaves home to mow 2-3 lawns per week and occasional trip to [employer].   Usually stays in bed until 11-12 PM  and stays up watching TV until 2-3 AM each night. However, he was enjoying a television show today, which he previously reported he no longer enjoyed TV and has been checking his mail daily, which he previously reports would pile up for weeks. Sleep hygiene: Continues to take multiple naps during the day and drinks diet soda.  Goal update: At last visit set goal of mowing 2-3 lawns per week, go to church  on Easter Sunday, check mail daily, reduce caffeine intake and limit napping, participate in 1 additional activity outside of the home/week. Pt has not been to church and reports his biggest problem is not feeling like going anywhere or being around others. He does have a friend call to check in on him every day. He is mowing more lawns and checking mail daily. No change in naps or caffeine. He watches church services on TV.  Patient has failed these meds in past: multiple  Patient is currently uncontrolled on the following medications:   Venlafaxine XR 75 mg and 150 mg - take total dose of 225 mg once daily  Tylenol PM - PRN for sleep   We discussed: would like to stay on venlafaxine as he believes it helps Assessment: patient seems to be improving although he continues to mourn his wife, adjusting his sleep schedule would likely benefit his fatigue/lack of motivation   Plan: Continue current medications; Recommend setting a bedtime routine and limiting daytime naps.   CCM Follow Up: June 08, 2020 at 11 AM (telephone visit)  Debbora Dus, PharmD Clinical Pharmacist [place] [facility] [phone]

## 2020-03-06 NOTE — Progress Notes (Signed)
I have collaborated with the care management provider regarding care management and care coordination activities outlined in this encounter and have reviewed this encounter including documentation in the note and care plan. I am certifying that I agree with the content of this note and encounter as supervising physician.  

## 2020-03-09 NOTE — Telephone Encounter (Signed)
There is another place that needs Dr. Daphine Deutscher signature.  Forms placed back in her office in box to sign on Tuesday.

## 2020-03-10 NOTE — Telephone Encounter (Signed)
Mr. Ellery notified that his patient assistance paperwork is complete but they do require proof of income.  I ask that he bring me that documentation and then we can fax in his application.  Patient states he will see if he can find income info.

## 2020-03-12 ENCOUNTER — Encounter: Payer: Self-pay | Admitting: *Deleted

## 2020-03-12 ENCOUNTER — Other Ambulatory Visit: Payer: Self-pay | Admitting: *Deleted

## 2020-03-12 NOTE — Patient Outreach (Signed)
[facility]) Care Management  03/12/2020  [name] 1950-12-31 [id number]    CSW made a fourth and final attempt to try and contact patient today to perform the initial phone assessment, as well as assess and assist with social work needs and services; however, patient was unavailable at the time of CSW's call.  CSW has left HIPAA compliant messages on voicemail for patient, while awaiting a return call.  CSW will proceed with case closure, as required number of phone attempts were made and an Unsuccessful Patient Outreach Letter was mailed to patient's home, allowing a total of 4 weeks for patient to respond if patient was interested in receiving social work services and resources through CSW with Chief Executive Officer.  CSW will notify patient's Primary Care Physician, Dr. Kerby Nora of CSW's plans to close patient's case, as well as send a Physician Case Closure Letter.  CSW will also mail a Case Closure Letter to patient's home.  Last, CSW will route this note to Kathyrn Sheriff, Chronic Special Needs Program Coordinator, and individual who placed the initial referral.    Danford Bad, BSW, MSW, LCSW  Licensed Clinical Social Worker  [facility]  Mailing [place]. [address], [place], [place] [address] Physical Address-[address], [place], [place] [address] Toll Free Main # [phone] Fax # [phone] Cell # [phone]  Office # [phone] [email or web address]

## 2020-03-31 ENCOUNTER — Encounter: Payer: Self-pay | Admitting: Emergency Medicine

## 2020-03-31 ENCOUNTER — Emergency Department: Payer: HMO

## 2020-03-31 ENCOUNTER — Other Ambulatory Visit: Payer: Self-pay

## 2020-03-31 ENCOUNTER — Inpatient Hospital Stay
Admission: EM | Admit: 2020-03-31 | Discharge: 2020-04-02 | DRG: 292 | Disposition: A | Discharge status: Home / Self Care | Payer: HMO | Attending: Internal Medicine | Admitting: Internal Medicine

## 2020-03-31 DIAGNOSIS — F329 Major depressive disorder, single episode, unspecified: Secondary | ICD-10-CM | POA: Diagnosis not present

## 2020-03-31 DIAGNOSIS — I110 Hypertensive heart disease with heart failure: Secondary | ICD-10-CM | POA: Diagnosis not present

## 2020-03-31 DIAGNOSIS — I10 Essential (primary) hypertension: Secondary | ICD-10-CM | POA: Diagnosis not present

## 2020-03-31 DIAGNOSIS — I2510 Atherosclerotic heart disease of native coronary artery without angina pectoris: Secondary | ICD-10-CM | POA: Diagnosis not present

## 2020-03-31 DIAGNOSIS — E785 Hyperlipidemia, unspecified: Secondary | ICD-10-CM | POA: Diagnosis not present

## 2020-03-31 DIAGNOSIS — N529 Male erectile dysfunction, unspecified: Secondary | ICD-10-CM | POA: Diagnosis not present

## 2020-03-31 DIAGNOSIS — R0789 Other chest pain: Secondary | ICD-10-CM | POA: Diagnosis present

## 2020-03-31 DIAGNOSIS — Z7982 Long term (current) use of aspirin: Secondary | ICD-10-CM | POA: Diagnosis not present

## 2020-03-31 DIAGNOSIS — I5033 Acute on chronic diastolic (congestive) heart failure: Secondary | ICD-10-CM | POA: Diagnosis not present

## 2020-03-31 DIAGNOSIS — Z20822 Contact with and (suspected) exposure to covid-19: Secondary | ICD-10-CM | POA: Diagnosis present

## 2020-03-31 DIAGNOSIS — Z7902 Long term (current) use of antithrombotics/antiplatelets: Secondary | ICD-10-CM

## 2020-03-31 DIAGNOSIS — E1165 Type 2 diabetes mellitus with hyperglycemia: Secondary | ICD-10-CM | POA: Diagnosis not present

## 2020-03-31 DIAGNOSIS — R739 Hyperglycemia, unspecified: Secondary | ICD-10-CM

## 2020-03-31 DIAGNOSIS — Z79899 Other long term (current) drug therapy: Secondary | ICD-10-CM

## 2020-03-31 DIAGNOSIS — Z833 Family history of diabetes mellitus: Secondary | ICD-10-CM | POA: Diagnosis not present

## 2020-03-31 DIAGNOSIS — R0602 Shortness of breath: Secondary | ICD-10-CM | POA: Diagnosis not present

## 2020-03-31 DIAGNOSIS — R079 Chest pain, unspecified: Secondary | ICD-10-CM

## 2020-03-31 DIAGNOSIS — Z87442 Personal history of urinary calculi: Secondary | ICD-10-CM

## 2020-03-31 DIAGNOSIS — I509 Heart failure, unspecified: Secondary | ICD-10-CM | POA: Diagnosis not present

## 2020-03-31 DIAGNOSIS — R0600 Dyspnea, unspecified: Secondary | ICD-10-CM | POA: Diagnosis not present

## 2020-03-31 DIAGNOSIS — K219 Gastro-esophageal reflux disease without esophagitis: Secondary | ICD-10-CM | POA: Diagnosis present

## 2020-03-31 DIAGNOSIS — Z6841 Body Mass Index (BMI) 40.0 and over, adult: Secondary | ICD-10-CM | POA: Diagnosis not present

## 2020-03-31 DIAGNOSIS — J810 Acute pulmonary edema: Secondary | ICD-10-CM

## 2020-03-31 DIAGNOSIS — R531 Weakness: Secondary | ICD-10-CM

## 2020-03-31 DIAGNOSIS — I517 Cardiomegaly: Secondary | ICD-10-CM | POA: Diagnosis not present

## 2020-03-31 DIAGNOSIS — J811 Chronic pulmonary edema: Secondary | ICD-10-CM | POA: Diagnosis not present

## 2020-03-31 DIAGNOSIS — Z794 Long term (current) use of insulin: Secondary | ICD-10-CM

## 2020-03-31 DIAGNOSIS — E1159 Type 2 diabetes mellitus with other circulatory complications: Secondary | ICD-10-CM | POA: Diagnosis present

## 2020-03-31 LAB — TROPONIN I (HIGH SENSITIVITY)
Troponin I (High Sensitivity): 19 ng/L — ABNORMAL HIGH (ref <18)
Troponin I (High Sensitivity): 22 ng/L — ABNORMAL HIGH (ref <18)

## 2020-03-31 LAB — COMPREHENSIVE METABOLIC PANEL
ALT: 23 U/L (ref 0–44)
AST: 21 U/L (ref 15–41)
Albumin: 3.9 g/dL (ref 3.5–5.0)
Alkaline Phosphatase: 98 U/L (ref 38–126)
Anion gap: 11 (ref 5–15)
BUN: 24 mg/dL — ABNORMAL HIGH (ref 8–23)
CO2: 25 mmol/L (ref 22–32)
Calcium: 8.6 mg/dL — ABNORMAL LOW (ref 8.9–10.3)
Chloride: 95 mmol/L — ABNORMAL LOW (ref 98–111)
Creatinine, Ser: 1.18 mg/dL (ref 0.61–1.24)
GFR calc Af Amer: >60 mL/min (ref >60)
GFR calc non Af Amer: >60 mL/min (ref >60)
Glucose, Bld: 719 mg/dL (ref 70–99)
Potassium: 4.1 mmol/L (ref 3.5–5.1)
Sodium: 131 mmol/L — ABNORMAL LOW (ref 135–145)
Total Bilirubin: 0.5 mg/dL (ref 0.3–1.2)
Total Protein: 7.4 g/dL (ref 6.5–8.1)

## 2020-03-31 LAB — BRAIN NATRIURETIC PEPTIDE: B Natriuretic Peptide: 48.4 pg/mL (ref 0.0–100.0)

## 2020-03-31 LAB — SARS CORONAVIRUS 2 BY RT PCR (HOSPITAL ORDER, PERFORMED IN ~~LOC~~ HOSPITAL LAB): SARS Coronavirus 2: NEGATIVE

## 2020-03-31 LAB — CBC
HCT: 41 % (ref 39.0–52.0)
Hemoglobin: 13.4 g/dL (ref 13.0–17.0)
MCH: 29.8 pg (ref 26.0–34.0)
MCHC: 32.7 g/dL (ref 30.0–36.0)
MCV: 91.3 fL (ref 80.0–100.0)
Platelets: 224 10*3/uL (ref 150–400)
RBC: 4.49 MIL/uL (ref 4.22–5.81)
RDW: 14.2 % (ref 11.5–15.5)
WBC: 7 10*3/uL (ref 4.0–10.5)
nRBC: 0 % (ref 0.0–0.2)

## 2020-03-31 LAB — GLUCOSE, CAPILLARY: Glucose-Capillary: 413 mg/dL — ABNORMAL HIGH (ref 70–99)

## 2020-03-31 MED ORDER — NITROGLYCERIN 0.4 MG SL SUBL: 0.4000 mg | SUBLINGUAL_TABLET | SUBLINGUAL | Status: DC | PRN

## 2020-03-31 MED ORDER — SODIUM CHLORIDE 0.9% FLUSH
3.0000 mL | Freq: Once | INTRAVENOUS | Status: AC
  Administered 2020-03-31: 3 mL via INTRAVENOUS

## 2020-03-31 MED ORDER — INSULIN ASPART 100 UNIT/ML ~~LOC~~ SOLN
10.0000 [IU] | Freq: Once | SUBCUTANEOUS | Status: AC
  Administered 2020-03-31: 10 [IU] via INTRAVENOUS
  Filled 2020-03-31: qty 1

## 2020-03-31 MED ORDER — INSULIN ASPART PROT & ASPART (70-30 MIX) 100 UNIT/ML ~~LOC~~ SUSP
30.0000 [IU] | Freq: Every day | SUBCUTANEOUS | Status: DC
  Administered 2020-04-01: 30 [IU] via SUBCUTANEOUS
  Filled 2020-03-31: qty 10

## 2020-03-31 MED ORDER — ASPIRIN EC 81 MG PO TBEC
81.0000 mg | DELAYED_RELEASE_TABLET | Freq: Every day | ORAL | Status: DC
  Administered 2020-04-01 – 2020-04-02 (×2): 81 mg via ORAL
  Filled 2020-03-31 (×2): qty 1

## 2020-03-31 MED ORDER — LOSARTAN POTASSIUM 50 MG PO TABS
50.0000 mg | ORAL_TABLET | Freq: Every day | ORAL | Status: DC
  Administered 2020-04-01 – 2020-04-02 (×2): 50 mg via ORAL
  Filled 2020-03-31 (×2): qty 1

## 2020-03-31 MED ORDER — INSULIN ASPART 100 UNIT/ML ~~LOC~~ SOLN
0.0000 [IU] | Freq: Three times a day (TID) | SUBCUTANEOUS | Status: DC
  Administered 2020-04-01: 15 [IU] via SUBCUTANEOUS
  Administered 2020-04-01: 11 [IU] via SUBCUTANEOUS
  Administered 2020-04-01: 15 [IU] via SUBCUTANEOUS
  Administered 2020-04-02: 7 [IU] via SUBCUTANEOUS
  Filled 2020-03-31 (×4): qty 1

## 2020-03-31 MED ORDER — ACETAMINOPHEN 325 MG PO TABS: 650.0000 mg | ORAL_TABLET | ORAL | Status: DC | PRN

## 2020-03-31 MED ORDER — SODIUM CHLORIDE 0.9% FLUSH: 3.0000 mL | INTRAVENOUS | Status: DC | PRN

## 2020-03-31 MED ORDER — SODIUM CHLORIDE 0.9% FLUSH
3.0000 mL | Freq: Two times a day (BID) | INTRAVENOUS | Status: DC
  Administered 2020-04-01 (×2): 3 mL via INTRAVENOUS

## 2020-03-31 MED ORDER — INSULIN ASPART PROT & ASPART (70-30 MIX) 100 UNIT/ML ~~LOC~~ SUSP
50.0000 [IU] | Freq: Every day | SUBCUTANEOUS | Status: DC
  Administered 2020-04-01 – 2020-04-02 (×2): 50 [IU] via SUBCUTANEOUS
  Filled 2020-03-31 (×2): qty 10

## 2020-03-31 MED ORDER — FUROSEMIDE 10 MG/ML IJ SOLN: 40.0000 mg | Freq: Once | INTRAMUSCULAR | Status: DC

## 2020-03-31 MED ORDER — SODIUM CHLORIDE 0.9 % IV SOLN: 250.0000 mL | INTRAVENOUS | Status: DC | PRN

## 2020-03-31 MED ORDER — CARVEDILOL 6.25 MG PO TABS
6.2500 mg | ORAL_TABLET | Freq: Every day | ORAL | Status: DC
  Administered 2020-04-01 – 2020-04-02 (×2): 6.25 mg via ORAL
  Filled 2020-03-31 (×2): qty 1

## 2020-03-31 MED ORDER — ENOXAPARIN SODIUM 40 MG/0.4ML ~~LOC~~ SOLN
40.0000 mg | SUBCUTANEOUS | Status: DC
  Administered 2020-04-01 – 2020-04-02 (×2): 40 mg via SUBCUTANEOUS
  Filled 2020-03-31 (×2): qty 0.4

## 2020-03-31 MED ORDER — FUROSEMIDE 10 MG/ML IJ SOLN
60.0000 mg | Freq: Two times a day (BID) | INTRAMUSCULAR | Status: DC
  Administered 2020-03-31: 60 mg via INTRAVENOUS
  Filled 2020-03-31: qty 8

## 2020-03-31 MED ORDER — ONDANSETRON HCL 4 MG/2ML IJ SOLN: 4.0000 mg | Freq: Four times a day (QID) | INTRAMUSCULAR | Status: DC | PRN

## 2020-03-31 NOTE — H&P (Signed)
History and Physical    [name] [id number]:[id number] DOB: Jan 04, 1951 DOA: 03/31/2020  PCP: Jinny Sanders, MD   Patient coming from: Home  I have personally briefly reviewed patient's old medical records in [place]  Chief Complaint: Shortness of breath, chest pain  HPI: [name] is a 69 y.o. male with medical history significant for insulin-dependent type 2 diabetes, hypertensive heart disease, diastolic heart failure, morbid obesity, CAD, nonobstructive per cath in 2008, who presents to the emergency room by EMS with  chest pain that started earlier in the day.  Chest pain described as retrosternal tightness, 5 out of 10, nonradiating, associated diaphoresis but no nausea vomiting, palpitations lightheadedness.  Patient reports shortness of breath and weakness for the past 2 weeks, orthopnea with PND at nights, sometimes associated with chest pain. ED Course: On arrival, BP 161/79 with otherwise normal vitals.  EKG with normal sinus rhythm and nonspecific ST-T wave changes.  First troponin 19, blood work significant for glucose of 719 but otherwise unremarkable.  Chest x-ray showed features suggesting CHF/volume overload with cardiomegaly vascular congestion and interstitial opacities which could reflect some mild interstitial edema with developing alveolar edema versus infection.  Patient given a dose of Lasix.  Hospitalist consulted for admission.  Review of Systems: As per HPI otherwise all other systems on review of systems negative.    Past Medical History:  Diagnosis Date  . Back injury 02/2002   worker's comp  . CHF (congestive heart failure) ([place])   . Coronary artery disease, non-occlusive    a. cath 2002 with no sig CAD;  b. cath 2008 normal LM, LAD, LCx, p&dRCA 20-30%, PDA 30%; c.11/2015 NSTEMI/PCI: LM nl, LAD 95p (2.5x15 Xience DES), LCX nl, RCA 100p/m w/ L->R collats, EF 55-65% c. NSTEMI (02/2016) with no culprit leision, switched to Brilinta.  d. NSTEMI 03/2016: again,  no culprit lesion and switched back to plavix 2/2 SOB with Brilnta.    . Depression   . Diabetes mellitus type 2, insulin dependent ([place])   . Hyperlipemia   . Hypertensive heart disease   . Kidney stones   . Morbid obesity ([place])   . Osteoarthritis   . Snoring     Past Surgical History:  Procedure Laterality Date  . CARDIAC CATHETERIZATION  09/2000   diffuse LAD 30% LCA  EF 50-60%  . CARDIAC CATHETERIZATION  06/2007   no significant CAD  . CARDIAC CATHETERIZATION N/A 12/25/2015   Procedure: Left Heart Cath and Coronary Angiography;  Surgeon: Minna Merritts, MD;  Location: [facility];  Service: Cardiovascular;  Laterality: N/A;  . CARDIAC CATHETERIZATION N/A 12/25/2015   Procedure: Coronary Stent Intervention;  Surgeon: Yolonda Kida, MD;  Location: [facility];  Service: Cardiovascular;  Laterality: N/A;  . CARDIAC CATHETERIZATION N/A 03/10/2016   Procedure: Left Heart Cath and Coronary Angiography;  Surgeon: Wellington Hampshire, MD;  Location: [facility];  Service: Cardiovascular;  Laterality: N/A;  . CARDIAC CATHETERIZATION N/A 04/06/2016   Procedure: Left Heart Cath and Coronary Angiography;  Surgeon: Belva Crome, MD;  Location: [facility];  Service: Cardiovascular;  Laterality: N/A;  . CIRCUMCISION       reports that he has never smoked. He has never used smokeless tobacco. He reports that he does not drink alcohol and does not use drugs.  Allergies  Allergen Reactions  . Atorvastatin Other (See Comments)    Body aches    Family History  Problem  Relation Age of Onset  . Alzheimer's disease Mother   . Emphysema Mother   . Diabetes Father   . Heart disease Father        MI  . Cancer Brother        ? Neck cancer      Prior to Admission medications   Medication Sig Start Date End Date Taking? Authorizing Provider  aspirin 81 MG EC tablet Take 1 tablet (81 mg total) by mouth daily. 01/12/16   Theora Gianotti, NP  Blood  Glucose Calibration (ONETOUCH VERIO) High SOLN  04/09/19   [provider]  Blood Glucose Monitoring Suppl ([place]) w/Device KIT  04/09/19   [provider]  carvedilol (COREG) 6.25 MG tablet Take 1 tablet (6.25 mg total) by mouth daily. 10/10/19   Minna Merritts, MD  clopidogrel (PLAVIX) 75 MG tablet TAKE 1 TABLET BY MOUTH ONCE DAILY WITH BREAKFAST 10/23/19   Bedsole, Amy E, MD  Continuous Blood Gluc Receiver (FREESTYLE LIBRE 14 DAY READER) DEVI 1 Device by Does not apply route every 14 (fourteen) days. 07/09/19   Bedsole, Amy E, MD  Continuous Blood Gluc Sensor (FREESTYLE LIBRE 14 DAY SENSOR) MISC 1 Device by Does not apply route every 14 (fourteen) days. 08/19/19   Bedsole, Amy E, MD  Dulaglutide (TRULICITY) 1.5 PV/9.4IA SOPN Inject 1.5 mg into the skin once a week. Patient not taking: Reported on 01/30/2020 01/21/20   Jinny Sanders, MD  ezetimibe (ZETIA) 10 MG tablet Take 1 tablet (10 mg total) by mouth daily. 10/10/19   Minna Merritts, MD  furosemide (LASIX) 40 MG tablet Take 1 tablet (40 mg total) by mouth daily. Can take a 2nd daily dose as needed. 12/24/19   Minna Merritts, MD  insulin NPH-regular Human (NOVOLIN 70/30) (70-30) 100 UNIT/ML injection Inject 70 units in the morning and 40 units at bedtime. 10/23/19   Bedsole, Amy E, MD  isosorbide mononitrate (IMDUR) 30 MG 24 hr tablet Take 1 tablet (30 mg total) by mouth 2 (two) times daily. 10/10/19   Minna Merritts, MD  Lancets (ONETOUCH DELICA PLUS [id number]) [place]  01/01/19   [provider]  losartan (COZAAR) 50 MG tablet Take 1 tablet (50 mg total) by mouth daily. 10/23/19   Bedsole, Amy E, MD  nitroGLYCERIN (NITROSTAT) 0.4 MG SL tablet DISSOLVE 1 TABLET UNDER TONGUE AS NEEDEDFOR CHEST PAIN. MAY REPEAT 5 MINUTES APART 3 TIMES IF NEEDED 12/28/18   Minna Merritts, MD  [facility] VERIO test strip  04/09/19   [provider]  potassium chloride SA (K-DUR) 20 MEQ tablet Take 1 tablet (20 mEq  total) by mouth daily. 01/03/19   Bedsole, Amy E, MD  rosuvastatin (CRESTOR) 40 MG tablet Take 20 mg by mouth daily.    [provider]  venlafaxine XR (EFFEXOR-XR) 150 MG 24 hr capsule TAKE 1 CAPSULE BY MOUTH ONCE DAILY WITH BREAKFAST. TAKE WITH 75 MG DOSE TO EQUAL 225MG DAILY 01/22/20   Bedsole, Amy E, MD  venlafaxine XR (EFFEXOR-XR) 75 MG 24 hr capsule TAKE 1 CAPSULE BY MOUTH ONCE DAILY. TAKEIN ADDITION TO THE 150 MG CAPSULE TO EQUAL A TOTAL DOSE OF 225 MG DAILY 01/22/20   Jinny Sanders, MD    Physical Exam: Vitals:   03/31/20 1639 03/31/20 1643  BP:  (!) 161/79  Pulse:  80  Resp:  16  Temp:  98.2 F (36.8 C)  TempSrc:  Oral  SpO2:  96%  Weight: 132  kg   Height: 5' 6"  (1.676 m)      Vitals:   03/31/20 1639 03/31/20 1643  BP:  (!) 161/79  Pulse:  80  Resp:  16  Temp:  98.2 F (36.8 C)  TempSrc:  Oral  SpO2:  96%  Weight: 132 kg   Height: 5' 6"  (1.676 m)       Constitutional: Alert and oriented x 3 . Not in any apparent distress HEENT:      Head: Normocephalic and atraumatic.         Eyes: PERLA, EOMI, Conjunctivae are normal. Sclera is non-icteric.       Mouth/Throat: Mucous membranes are moist.       Neck: Supple with no signs of meningismus. Cardiovascular: Regular rate and rhythm. No murmurs, gallops, or rubs. 2+ symmetrical distal pulses are present . No JVD. No 2+ LE edema Respiratory: Respiratory effort increased.Lungs sounds diminished bilaterally.  Bibasilar crackles Gastrointestinal: Soft, and mildly distended with positive bowel sounds. No rebound or guarding. Genitourinary: No CVA tenderness. Musculoskeletal: Nontender with normal range of motion in all extremities. No cyanosis, or erythema of extremities. Neurologic: Normal speech and language. Face is symmetric. Moving all extremities. No gross focal neurologic deficits . Skin: Skin is warm, dry.  No rash or ulcers Psychiatric: Mood and affect are normal Speech and behavior are normal   Labs on  Admission: I have personally reviewed following labs and imaging studies  CBC: Recent Labs  Lab 03/31/20 1641  WBC 7.0  HGB 13.4  HCT 41.0  MCV 91.3  PLT 096   Basic Metabolic Panel: Recent Labs  Lab 03/31/20 1641  NA 131*  K 4.1  CL 95*  CO2 25  GLUCOSE 719*  BUN 24*  CREATININE 1.18  CALCIUM 8.6*   GFR: Estimated Creatinine Clearance: 75.1 mL/min (by C-G formula based on SCr of 1.18 mg/dL). Liver Function Tests: Recent Labs  Lab 03/31/20 1641  AST 21  ALT 23  ALKPHOS 98  BILITOT 0.5  PROT 7.4  ALBUMIN 3.9   No results for input(s): LIPASE, AMYLASE in the last 168 hours. No results for input(s): AMMONIA in the last 168 hours. Coagulation Profile: No results for input(s): INR, PROTIME in the last 168 hours. Cardiac Enzymes: No results for input(s): CKTOTAL, CKMB, CKMBINDEX, TROPONINI in the last 168 hours. BNP (last 3 results) No results for input(s): PROBNP in the last 8760 hours. HbA1C: No results for input(s): HGBA1C in the last 72 hours. CBG: Recent Labs  Lab 03/31/20 2105  GLUCAP 413*   Lipid Profile: No results for input(s): CHOL, HDL, LDLCALC, TRIG, CHOLHDL, LDLDIRECT in the last 72 hours. Thyroid Function Tests: No results for input(s): TSH, T4TOTAL, FREET4, T3FREE, THYROIDAB in the last 72 hours. Anemia Panel: No results for input(s): VITAMINB12, FOLATE, FERRITIN, TIBC, IRON, RETICCTPCT in the last 72 hours. Urine analysis:    Component Value Date/Time   COLORURINE YELLOW (A) 09/28/2015 1243   APPEARANCEUR CLEAR (A) 09/28/2015 1243   LABSPEC 1.025 09/28/2015 1243   PHURINE 6.0 09/28/2015 1243   GLUCOSEU >500 (A) 09/28/2015 1243   HGBUR NEGATIVE 09/28/2015 1243   BILIRUBINUR NEGATIVE 09/28/2015 1243   KETONESUR NEGATIVE 09/28/2015 1243   PROTEINUR NEGATIVE 09/28/2015 1243   UROBILINOGEN 1.0 05/19/2009 1218   NITRITE NEGATIVE 09/28/2015 1243   LEUKOCYTESUR NEGATIVE 09/28/2015 1243    Radiological Exams on Admission: DG Chest 2  View  Result Date: 03/31/2020 CLINICAL DATA:  Shortness of breath, chest pain and tightness, dyspnea on exertion  EXAM: CHEST - 2 VIEW COMPARISON:  Radiograph 09/21/2016, CT 09/01/2009 FINDINGS: There is cardiomegaly and central pulmonary vascular congestion with cephalization. Some diffuse hazy opacities are present throughout the lungs with basilar and perihilar predominance. More focal consolidative opacity is seen in the retrocardiac space and right infrahilar lung. No pneumothorax or visible effusion. Cardiomediastinal contours are otherwise unremarkable. No acute osseous or soft tissue abnormality. Degenerative changes are present in the imaged spine and shoulders. IMPRESSION: Features suggesting CHF/volume overload with cardiomegaly, vascular congestion and interstitial opacities which could reflect some mild interstitial edema. Additional patchy consolidative opacities in the lungs could reflect developing alveolar edema or infection. Electronically Signed   By: Lovena Le M.D.   On: 03/31/2020 17:07    EKG: Independently reviewed. Interpretation : Normal sinus rhythm with nonspecific ST-T wave changes  Assessment/Plan 69 year old male with history of insulin-dependent type 2 diabetes, hypertensive heart disease, diastolic heart failure, morbid obesity, CAD, nonobstructive per cath in 2008, presenting with 2-week history of dyspnea on exertion, orthopnea PND and retrosternal chest pain    Acute on chronic diastolic CHF (congestive heart failure) ([place]) -Patient presents with dyspnea on exertion, orthopnea, chest x-ray consistent with heart failure.  BNP pending -IV Lasix.  Continue home carvedilol and losartan -Possibly precipitated by poor compliance with medication -Continue to trend troponins to evaluate for ACS given chest pain -Daily weights intake and output monitoring -Echocardiogram in the a.m.    Chest pain -Patient with chest congestion/tightness associated with shortness of  breath -First troponin was 19, suspect demand ischemia.  EKG nonacute. -Continue to trend to evaluate for ACS -History of cardiac cath in 2008 that showed nonocclusive disease -Follow echocardiogram to evaluate for segmental wall motion abnormality -Nitroglycerin sublingual as needed chest pain with morphine for breakthrough -Continue home aspirin, carvedilol, statin    Hyperglycemia due to type 2 diabetes mellitus (HCC) -Blood sugar over 700 in the emergency room, improving to 400 by admission -Continue home insulin 70/30 but on reduced dosages with sliding scale backup coverage -Follow A1c    Essential hypertension, benign -Continue home antihypertensives pending med rec    Morbid obesity ([place]) -Complicates overall prognosis and care     DVT prophylaxis: Lovenox  Code Status: full code  Family Communication:  none  Disposition Plan: Back to previous home environment Consults called: none  Status:At the time of admission, it appears that the appropriate admission status for this patient is INPATIENT. This is judged to be reasonable and necessary in order to provide the required intensity of service to ensure the patient's safety given the presenting symptoms, physical exam findings, and initial radiographic and laboratory data in the context of their  Comorbid conditions.   Patient requires inpatient status due to high intensity of service, high risk for further deterioration and high frequency of surveillance required.   I certify that at the point of admission it is my clinical judgment that the patient will require inpatient hospital care spanning beyond [facility] [place] MD [facility]     03/31/2020, 9:43 PM

## 2020-03-31 NOTE — ED Triage Notes (Signed)
See first RN Note, pt states CP started when he woke up this morning, states generalized chest tightness, pt with noted dyspnea with exertion in triage. Pt states pain 5/10, tightness in nature.   Pt also noted to be very diaphoretic upon arrival to triage.

## 2020-03-31 NOTE — ED Provider Notes (Signed)
[facility] Emergency Department Provider Note   ____________________________________________    I have reviewed the triage vital signs and the nursing notes.   HISTORY  Chief Complaint Hyperglycemia, Chest Pain, and Weakness     HPI [name] is a 69 y.o. male with history of CHF, CAD, diabetes who presents with elevated glucose, chest discomfort, and worsening shortness of breath as well as diffuse weakness over the last 2 weeks.  Patient reports he frequently wakes up in the middle night feeling short of breath.  He becomes very short of breath with any exertion and does feel his chest occasionally get tight as well.  Does not currently have any chest pain.  No fevers or chills or cough.  No nausea or vomiting.  Has had COVID-19 vaccines  Past Medical History:  Diagnosis Date  . Back injury 02/2002   worker's comp  . CHF (congestive heart failure) ([place])   . Coronary artery disease, non-occlusive    a. cath 2002 with no sig CAD;  b. cath 2008 normal LM, LAD, LCx, p&dRCA 20-30%, PDA 30%; c.11/2015 NSTEMI/PCI: LM nl, LAD 95p (2.5x15 Xience DES), LCX nl, RCA 100p/m w/ L->R collats, EF 55-65% c. NSTEMI (02/2016) with no culprit leision, switched to Brilinta.  d. NSTEMI 03/2016: again, no culprit lesion and switched back to plavix 2/2 SOB with Brilnta.    . Depression   . Diabetes mellitus type 2, insulin dependent ([place])   . Hyperlipemia   . Hypertensive heart disease   . Kidney stones   . Morbid obesity ([place])   . Osteoarthritis   . Snoring     Patient Active Problem List   Diagnosis Date Noted  . Chest pain 03/31/2020  . Hyperglycemia due to type 2 diabetes mellitus ([place]) 03/31/2020  . Acute on chronic diastolic (congestive) heart failure ([place]) 03/31/2020  . Acute diarrhea 10/18/2018  . Tick bite of abdominal wall 04/10/2018  . Tinea corporis 04/10/2018  . Fatigue 03/06/2018  . ED (erectile dysfunction) 03/06/2018  . Dermatitis 01/16/2018  .  Tinea pedis of both feet 01/16/2018  . Acute on chronic diastolic CHF (congestive heart failure) ([place]) 09/20/2016  . Adjustment disorder with mixed anxiety and depressed mood 09/09/2016  . Gastroesophageal reflux disease 09/09/2016  . Coronary artery disease, non-occlusive   . Elevated left ventricular end-diastolic pressure (LVEDP) 03/11/2016  . Leg swelling 03/11/2016  . Anemia 03/11/2016  . Hypertensive heart disease with heart failure ([place])   . DM (diabetes mellitus), secondary, uncontrolled, w/eye complications ([place])   . Coronary artery disease of native artery of native heart with stable angina pectoris ([place])   . Morbid obesity ([place])   . Chronic chest pain   . NSTEMI (non-ST elevated myocardial infarction) ([place])   . Advanced care planning/counseling discussion 11/20/2015  . Noncompliance with diabetes treatment 11/05/2015  . Noncompliance with diet and medication regimen 11/05/2015  . Diabetic retinopathy ([place]) 09/23/2014  . Snoring 12/21/2012  . HYPOGONADISM 09/28/2010  . VITAMIN B12 DEFICIENCY 09/28/2010  . Vitamin D deficiency 09/28/2010  . OTHER MALAISE AND FATIGUE 09/17/2010  . TRIGGER FINGER, RIGHT MIDDLE 09/14/2010  . [place] OCCLUSION 11/26/2009  . Leg pain 10/06/2009  . CONSTIPATION 08/10/2009  . GASTRITIS 07/29/2009  . Major depressive disorder, recurrent episode, moderate ([place]) 07/06/2007  . ERECTILE DYSFUNCTION 04/10/2007  . RENAL CALCULUS, HX OF 03/26/2007  . Hyperlipidemia LDL goal <70 12/05/2006  . Essential hypertension, benign 12/05/2006  . Osteoarthritis 12/05/2006    Past  Surgical History:  Procedure Laterality Date  . CARDIAC CATHETERIZATION  09/2000   diffuse LAD 30% LCA  EF 50-60%  . CARDIAC CATHETERIZATION  06/2007   no significant CAD  . CARDIAC CATHETERIZATION N/A 12/25/2015   Procedure: Left Heart Cath and Coronary Angiography;  Surgeon: Minna Merritts, MD;  Location: [place] [facility];  Service: Cardiovascular;  Laterality:  N/A;  . CARDIAC CATHETERIZATION N/A 12/25/2015   Procedure: Coronary Stent Intervention;  Surgeon: Yolonda Kida, MD;  Location: [place] [facility];  Service: Cardiovascular;  Laterality: N/A;  . CARDIAC CATHETERIZATION N/A 03/10/2016   Procedure: Left Heart Cath and Coronary Angiography;  Surgeon: Wellington Hampshire, MD;  Location: [place] [facility];  Service: Cardiovascular;  Laterality: N/A;  . CARDIAC CATHETERIZATION N/A 04/06/2016   Procedure: Left Heart Cath and Coronary Angiography;  Surgeon: Belva Crome, MD;  Location: [place] [facility];  Service: Cardiovascular;  Laterality: N/A;  . CIRCUMCISION      Prior to Admission medications   Medication Sig Start Date End Date Taking? Authorizing Provider  aspirin 81 MG EC tablet Take 1 tablet (81 mg total) by mouth daily. 01/12/16   Theora Gianotti, NP  Blood Glucose Calibration (ONETOUCH VERIO) High SOLN  04/09/19   [provider]  Blood Glucose Monitoring Suppl ([place]) w/Device KIT  04/09/19   [provider]  carvedilol (COREG) 6.25 MG tablet Take 1 tablet (6.25 mg total) by mouth daily. 10/10/19   Minna Merritts, MD  clopidogrel (PLAVIX) 75 MG tablet TAKE 1 TABLET BY MOUTH ONCE DAILY WITH BREAKFAST 10/23/19   Bedsole, Amy E, MD  Continuous Blood Gluc Receiver (FREESTYLE LIBRE 14 DAY READER) DEVI 1 Device by Does not apply route every 14 (fourteen) days. 07/09/19   Bedsole, Amy E, MD  Continuous Blood Gluc Sensor (FREESTYLE LIBRE 14 DAY SENSOR) MISC 1 Device by Does not apply route every 14 (fourteen) days. 08/19/19   Bedsole, Amy E, MD  Dulaglutide (TRULICITY) 1.5 IR/5.1OA SOPN Inject 1.5 mg into the skin once a week. Patient not taking: Reported on 01/30/2020 01/21/20   Jinny Sanders, MD  ezetimibe (ZETIA) 10 MG tablet Take 1 tablet (10 mg total) by mouth daily. 10/10/19   Minna Merritts, MD  furosemide (LASIX) 40 MG tablet Take 1 tablet (40 mg total) by mouth daily. Can take a 2nd  daily dose as needed. 12/24/19   Minna Merritts, MD  insulin NPH-regular Human (NOVOLIN 70/30) (70-30) 100 UNIT/ML injection Inject 70 units in the morning and 40 units at bedtime. 10/23/19   Bedsole, Amy E, MD  isosorbide mononitrate (IMDUR) 30 MG 24 hr tablet Take 1 tablet (30 mg total) by mouth 2 (two) times daily. 10/10/19   Minna Merritts, MD  Lancets (ONETOUCH DELICA PLUS [id number]) [place]  01/01/19   [provider]  losartan (COZAAR) 50 MG tablet Take 1 tablet (50 mg total) by mouth daily. 10/23/19   Bedsole, Amy E, MD  nitroGLYCERIN (NITROSTAT) 0.4 MG SL tablet DISSOLVE 1 TABLET UNDER TONGUE AS NEEDEDFOR CHEST PAIN. MAY REPEAT 5 MINUTES APART 3 TIMES IF NEEDED 12/28/18   Minna Merritts, MD  [facility] VERIO test strip  04/09/19   [provider]  potassium chloride SA (K-DUR) 20 MEQ tablet Take 1 tablet (20 mEq total) by mouth daily. 01/03/19   Bedsole, Amy E, MD  rosuvastatin (CRESTOR) 40 MG tablet Take 20 mg by mouth daily.    [provider]  venlafaxine XR (EFFEXOR-XR) 150 MG 24 hr capsule TAKE 1 CAPSULE BY MOUTH ONCE DAILY WITH BREAKFAST. TAKE WITH 75 MG DOSE TO EQUAL 225MG DAILY 01/22/20   Bedsole, Amy E, MD  venlafaxine XR (EFFEXOR-XR) 75 MG 24 hr capsule TAKE 1 CAPSULE BY MOUTH ONCE DAILY. TAKEIN ADDITION TO THE 150 MG CAPSULE TO EQUAL A TOTAL DOSE OF 225 MG DAILY 01/22/20   Bedsole, Amy E, MD     Allergies Atorvastatin  Family History  Problem Relation Age of Onset  . Alzheimer's disease Mother   . Emphysema Mother   . Diabetes Father   . Heart disease Father        MI  . Cancer Brother        ? Neck cancer    Social History Social History   Tobacco Use  . Smoking status: Never Smoker  . Smokeless tobacco: Never Used  Vaping Use  . Vaping Use: Never used  Substance Use Topics  . Alcohol use: No    Alcohol/week: 0.0 standard drinks  . Drug use: No    Review of Systems  Constitutional: No fever/chills Eyes: No visual changes.  ENT: No  sore throat. Cardiovascular: As above Respiratory: As above Gastrointestinal: No abdominal pain.   Genitourinary: Negative for dysuria.  Positive frequent Musculoskeletal: Negative for back pain. Skin: Negative for rash. Neurological: Negative for headaches  ____________________________________________   PHYSICAL EXAM:  VITAL SIGNS: ED Triage Vitals  Enc Vitals Group     BP 03/31/20 1643 (!) 161/79     Pulse Rate 03/31/20 1643 80     Resp 03/31/20 1643 16     Temp 03/31/20 1643 98.2 F (36.8 C)     Temp Source 03/31/20 1643 Oral     SpO2 03/31/20 1643 96 %     Weight 03/31/20 1639 132 kg (291 lb)     Height 03/31/20 1639 1.676 m (5' 6" )     Head Circumference --      Peak Flow --      Pain Score 03/31/20 1639 5     Pain Loc --      Pain Edu? --      Excl. in [place]? --     Constitutional: Alert and oriented.   Nose: No congestion/rhinnorhea. Mouth/Throat: Mucous membranes are moist.    Cardiovascular: Normal rate, regular rhythm  Good peripheral circulation. Respiratory: Normal respiratory effort.  No retractions. Lungs CTAB. Gastrointestinal: Soft and nontender. No distention.  Bibasilar Rales  Musculoskeletal: No lower extremity tenderness nor edema.  Warm and well perfused Neurologic:  Normal speech and language. No gross focal neurologic deficits are appreciated.  Skin:  Skin is warm, dry and intact. No rash noted. Psychiatric: Mood and affect are normal. Speech and behavior are normal.  ____________________________________________   LABS (all labs ordered are listed, but only abnormal results are displayed)  Labs Reviewed  COMPREHENSIVE METABOLIC PANEL - Abnormal; Notable for the following components:      Result Value   Sodium 131 (*)    Chloride 95 (*)    Glucose, Bld 719 (*)    BUN 24 (*)    Calcium 8.6 (*)    All other components within normal limits  GLUCOSE, CAPILLARY - Abnormal; Notable for the following components:   Glucose-Capillary 413 (*)      All other components within normal limits  TROPONIN I (HIGH SENSITIVITY) - Abnormal; Notable for the following components:   Troponin I (High Sensitivity) 19 (*)  All other components within normal limits  SARS CORONAVIRUS 2 BY RT PCR (HOSPITAL ORDER, [facility])  CBC  CBG MONITORING, ED  TROPONIN I (HIGH SENSITIVITY)  TROPONIN I (HIGH SENSITIVITY)   ____________________________________________  EKG  ED ECG REPORT I, Lavonia Drafts, the attending physician, personally viewed and interpreted this ECG.  Date: 03/31/2020  Rhythm: normal sinus rhythm QRS Axis: normal Intervals: First-degree AV block ST/T Wave abnormalities: normal Narrative Interpretation: no evidence of acute ischemia  ____________________________________________  RADIOLOGY  Chest x-ray reviewed by me, cardiomegaly and interstitial edema ____________________________________________   PROCEDURES  Procedure(s) performed: No  Procedures   Critical Care performed: No ____________________________________________   INITIAL IMPRESSION / ASSESSMENT AND PLAN / ED COURSE  Pertinent labs & imaging results that were available during my care of the patient were reviewed by me and considered in my medical decision making (see chart for details).  Patient presents with multiple complaints, including shortness of breath, chest tightness with exertion, fatigue and weakness over the last 2 weeks.  Lab work is notable for glucose of 719, anion gap is normal, CO2 is normal.  Troponin mildly elevated at 19, no active chest pain currently.  White blood cell count is normal.  Chest x-ray most consistent with pulmonary edema/CHF.  This is likely the cause of his shortness of breath with exertion and chest discomfort as well.  It may also explain his fatigue.  Becomes markedly short of breath with ambulation although was able to maintain saturations above 90% during a very short  walk  Treated with IV Lasix, IV insulin and admitted to the hospital service for diuresis    ____________________________________________   FINAL CLINICAL IMPRESSION(S) / ED DIAGNOSES  Final diagnoses:  Acute pulmonary edema (HCC)  Hyperglycemia  Weakness  Chest pain, unspecified type        Note:  This document was prepared using Dragon voice recognition software and may include unintentional dictation errors.   Lavonia Drafts, MD 03/31/20 [phone]

## 2020-03-31 NOTE — ED Triage Notes (Signed)
Pt comes into the ED via EMS from home with c/o CP and SOB today, LBB, 98HR, CBG548, a/ox4 on arrival 98%RA

## 2020-04-01 ENCOUNTER — Inpatient Hospital Stay
Admit: 2020-04-01 | Discharge: 2020-04-01 | Disposition: A | Discharge status: Home / Self Care | Payer: HMO | Attending: Internal Medicine | Admitting: Internal Medicine

## 2020-04-01 ENCOUNTER — Other Ambulatory Visit: Payer: Self-pay

## 2020-04-01 DIAGNOSIS — E1165 Type 2 diabetes mellitus with hyperglycemia: Secondary | ICD-10-CM

## 2020-04-01 DIAGNOSIS — R079 Chest pain, unspecified: Secondary | ICD-10-CM

## 2020-04-01 DIAGNOSIS — Z794 Long term (current) use of insulin: Secondary | ICD-10-CM

## 2020-04-01 DIAGNOSIS — F329 Major depressive disorder, single episode, unspecified: Secondary | ICD-10-CM

## 2020-04-01 LAB — GLUCOSE, CAPILLARY
Glucose-Capillary: 173 mg/dL — ABNORMAL HIGH (ref 70–99)
Glucose-Capillary: 276 mg/dL — ABNORMAL HIGH (ref 70–99)
Glucose-Capillary: 309 mg/dL — ABNORMAL HIGH (ref 70–99)
Glucose-Capillary: 330 mg/dL — ABNORMAL HIGH (ref 70–99)
Glucose-Capillary: 338 mg/dL — ABNORMAL HIGH (ref 70–99)

## 2020-04-01 LAB — BASIC METABOLIC PANEL
Anion gap: 12 (ref 5–15)
BUN: 23 mg/dL (ref 8–23)
CO2: 27 mmol/L (ref 22–32)
Calcium: 8.9 mg/dL (ref 8.9–10.3)
Chloride: 99 mmol/L (ref 98–111)
Creatinine, Ser: 1.09 mg/dL (ref 0.61–1.24)
GFR calc Af Amer: >60 mL/min (ref >60)
GFR calc non Af Amer: >60 mL/min (ref >60)
Glucose, Bld: 353 mg/dL — ABNORMAL HIGH (ref 70–99)
Potassium: 3.4 mmol/L — ABNORMAL LOW (ref 3.5–5.1)
Sodium: 138 mmol/L (ref 135–145)

## 2020-04-01 LAB — ECHOCARDIOGRAM COMPLETE
AR max vel: 2.23 cm2
AV Area VTI: 2.53 cm2
AV Area mean vel: 2.13 cm2
AV Mean grad: 4.5 mmHg
AV Peak grad: 7.7 mmHg
Ao pk vel: 1.39 m/s
Area-P 1/2: 2.83 cm2
Height: 67 in
S' Lateral: 2.75 cm
Weight: 4683.2 oz

## 2020-04-01 LAB — HEMOGLOBIN A1C
Hgb A1c MFr Bld: 11 % — ABNORMAL HIGH (ref 4.8–5.6)
Mean Plasma Glucose: 269 mg/dL

## 2020-04-01 LAB — HIV ANTIBODY (ROUTINE TESTING W REFLEX): HIV Screen 4th Generation wRfx: NONREACTIVE

## 2020-04-01 LAB — TROPONIN I (HIGH SENSITIVITY)
Troponin I (High Sensitivity): 22 ng/L — ABNORMAL HIGH (ref <18)
Troponin I (High Sensitivity): 24 ng/L — ABNORMAL HIGH (ref <18)

## 2020-04-01 MED ORDER — VENLAFAXINE HCL ER 75 MG PO CP24
150.0000 mg | ORAL_CAPSULE | Freq: Every day | ORAL | Status: DC
  Administered 2020-04-01 – 2020-04-02 (×2): 150 mg via ORAL
  Filled 2020-04-01 (×2): qty 2

## 2020-04-01 MED ORDER — POTASSIUM CHLORIDE CRYS ER 20 MEQ PO TBCR
20.0000 meq | EXTENDED_RELEASE_TABLET | Freq: Two times a day (BID) | ORAL | Status: DC
  Administered 2020-04-01 – 2020-04-02 (×3): 20 meq via ORAL
  Filled 2020-04-01 (×3): qty 1

## 2020-04-01 MED ORDER — ISOSORBIDE MONONITRATE ER 30 MG PO TB24
30.0000 mg | ORAL_TABLET | Freq: Two times a day (BID) | ORAL | Status: DC
  Administered 2020-04-01 – 2020-04-02 (×3): 30 mg via ORAL
  Filled 2020-04-01 (×3): qty 1

## 2020-04-01 MED ORDER — TRAZODONE HCL 50 MG PO TABS
50.0000 mg | ORAL_TABLET | Freq: Every day | ORAL | Status: DC
  Administered 2020-04-01: 50 mg via ORAL
  Filled 2020-04-01: qty 1

## 2020-04-01 MED ORDER — VENLAFAXINE HCL ER 75 MG PO CP24
75.0000 mg | ORAL_CAPSULE | Freq: Every day | ORAL | Status: DC
  Administered 2020-04-01 – 2020-04-02 (×2): 75 mg via ORAL
  Filled 2020-04-01 (×2): qty 1

## 2020-04-01 MED ORDER — CLOPIDOGREL BISULFATE 75 MG PO TABS
75.0000 mg | ORAL_TABLET | Freq: Every day | ORAL | Status: DC
  Administered 2020-04-01 – 2020-04-02 (×2): 75 mg via ORAL
  Filled 2020-04-01 (×2): qty 1

## 2020-04-01 MED ORDER — VITAMIN D 25 MCG (1000 UNIT) PO TABS
2000.0000 [IU] | ORAL_TABLET | Freq: Every day | ORAL | Status: DC
  Administered 2020-04-01 – 2020-04-02 (×2): 2000 [IU] via ORAL
  Filled 2020-04-01 (×2): qty 2

## 2020-04-01 MED ORDER — FUROSEMIDE 10 MG/ML IJ SOLN
60.0000 mg | Freq: Two times a day (BID) | INTRAMUSCULAR | Status: DC
  Administered 2020-04-01 – 2020-04-02 (×3): 60 mg via INTRAVENOUS
  Filled 2020-04-01 (×3): qty 6

## 2020-04-01 MED ORDER — EZETIMIBE 10 MG PO TABS
10.0000 mg | ORAL_TABLET | Freq: Every day | ORAL | Status: DC
  Administered 2020-04-01 – 2020-04-02 (×2): 10 mg via ORAL
  Filled 2020-04-01 (×2): qty 1

## 2020-04-01 NOTE — Plan of Care (Signed)
  Problem: Education: Goal: Emotional status will improve 04/01/2020 1543 by Ruben Reason, RN Outcome: Progressing 04/01/2020 1536 by Ruben Reason, RN Outcome: Progressing   Problem: Education: Goal: Mental status will improve 04/01/2020 1543 by Ruben Reason, RN Outcome: Progressing 04/01/2020 1536 by Ruben Reason, RN Outcome: Progressing   Problem: Activity: Goal: Sleeping patterns will improve Outcome: Progressing

## 2020-04-01 NOTE — Progress Notes (Signed)
BMI out of range for Reds vest reading to be completed

## 2020-04-01 NOTE — Progress Notes (Signed)
Inpatient Diabetes Program Recommendations  AACE/ADA: New Consensus Statement on Inpatient Glycemic Control   Target Ranges:  Prepandial:   less than 140 mg/dL      Peak postprandial:   less than 180 mg/dL (1-2 hours)      Critically ill patients:  140 - 180 mg/dL  Results for AQUAN, KOPE (MRN [id number]) as of 04/01/2020 12:19  Ref. Range 04/01/2020 12:10  Glucose-Capillary Latest Ref Range: 70 - 99 mg/dL 629 (H)   Results for AKHIL, PISCOPO (MRN [id number]) as of 04/01/2020 12:05  Ref. Range 03/31/2020 21:05 04/01/2020 00:40 04/01/2020 07:45  Glucose-Capillary Latest Ref Range: 70 - 99 mg/dL 010 (H)  Novolog 10 units 338 (H) 330 (H)  Novolog 15 units  70/30 50 units  Results for TARRIS, DELBENE (MRN [id number]) as of 04/01/2020 12:05  Ref. Range 03/31/2020 16:41  Glucose Latest Ref Range: 70 - 99 mg/dL 034 [facility])   Results for MORRELL, FLUKE (MRN [id number]) as of 04/01/2020 12:05  Ref. Range 10/15/2019 15:04 01/07/2020 07:40 03/31/2020 21:49  Hemoglobin A1C Latest Ref Range: 4.8 - 5.6 % 10.1 (A) 10.0 (H) 11.0 (H)   Review of Glycemic Control  Diabetes history: DM2 Outpatient Diabetes medications: 70/30 70 units QAM, 70/30 40 units QPM Current orders for Inpatient glycemic control: 70/30 50 units QAM, 70/30 30 units QPM, Novolog 0-20 units TID with meals  Inpatient Diabetes Program Recommendations:    HbgA1C:  A1C 11% on 03/31/20 indicating an average glucose of 269 mg/dl over the past 2-3 months.  NOTE: Spoke with patient about diabetes and home regimen for diabetes control. Patient reports being followed by PCP for diabetes management and currently taking 70/30 70 units QAM and 70/30 40 units QPM as an outpatient for diabetes control. Inquired about Trulicity noted on home medication list and patient states he has not taken Trulicity in months. 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  Patient states that he has not checked his glucose in the past week because he has a FreeStyle Libre CGM but it won't stay stuck to his arm lately due to him sweating so much. Patient reports that he does not have a glucometer to check finger stick glucose. Discussed things he could try to see if FreeStyle Libre sticks to his skin better (such as with skin tac or using a clear tegaderm dressing to cover the CGM or some other type of tape).  Encouraged patient to talk with nursing staff at PCP office to see what other options they may have to keep the FreeStyle from not sticking. Would recommend patient be given Rx for a glucometer and testing supplies for finger sticks to have available to monitor glucose since he is having an issue with the FreeStyle [place] staying on. Patient states that it is hard for him to do finger sticks due to poor eyesight (notes he is partially blind in both eyes).   Patient states that when he was able to check his glucose it "was always high, 200's and higher".  Patient reports that last A1C was 10%.  Discussed A1C results (11% on 03/31/20 ) and explained that current A1C indicates an average glucose of 269 mg/dl over the past 2-3 months. Discussed glucose and A1C goals. Discussed importance of checking CBGs and maintaining good  CBG control to prevent long-term and short-term complications. Explained how hyperglycemia leads to damage within blood vessels which lead to the common complications seen with uncontrolled diabetes. Stressed to the patient the importance of improving glycemic control to prevent further complications from uncontrolled diabetes. Encouraged patient to reach out to PCP to find out status of Trulicity, to ask about being referred to an Endocrinologist to help improve DM control, to try the [employer] again with suggestions noted above to see if it stays on better, and to provide  glucose information to providers to assist them with making adjustments with insulin if needed.   Patient verbalized understanding of information discussed and reports no further questions at this time related to diabetes.  Thanks, Orlando Penner, RN, MSN, CDE Diabetes Coordinator Inpatient Diabetes Program [phone] (Team Pager)

## 2020-04-01 NOTE — Plan of Care (Signed)
  Problem: Education: Goal: Knowledge of [facility] Education information/materials will improve Outcome: Progressing Goal: Emotional status will improve Outcome: Progressing Goal: Mental status will improve Outcome: Progressing Goal: Verbalization of understanding the information provided will improve Outcome: Progressing   Problem: Activity: Goal: Interest or engagement in activities will improve Outcome: Progressing Goal: Sleeping patterns will improve Outcome: Progressing   

## 2020-04-01 NOTE — Progress Notes (Signed)
*  PRELIMINARY RESULTS* Echocardiogram 2D Echocardiogram has been performed.  [name] 04/01/2020, 9:26 AM 

## 2020-04-01 NOTE — Evaluation (Signed)
Physical Therapy Evaluation Patient Details Name: [name] MRN: [id number] DOB: August 16, 1951 Today's Date: 04/01/2020   History of Present Illness  Pt is a 69 y.o. male presenting to hospital 8/3 with elevated glucose, chest discomfort/tightness with exertion, worsening SOB, and diffuse weakness x2 weeks.  Pt admitted with acute on chronic diastolic CHF with LE edema and chest pain.  PMH includes CHF, CAD, DM, h/o back injury, depression, NSTEMI.  Clinical Impression  Prior to hospital admission, pt was modified independent with ambulation (used walking stick in home and rollator short distances outside); lives alone in 1 level home with ramp to enter.  Pt appearing down when talking about his wife who had passed a few years ago.  Currently pt is independent with transfers and SBA ambulating 40 feet (no AD) in room.  Limited distance ambulating d/t SOB (O2 sats 93% or greater on room air; HR WFL).  Pt with mild antalgic gait (pt reports h/o R knee issues) but overall steady and safe with ambulation in room.  Of note, pt reports he is "85% blind"--nurse notified (pt able to compensate well and takes his time with mobility in room).  Pt would benefit from skilled PT to address noted impairments and functional limitations (see below for any additional details).  Upon hospital discharge, no further PT needs anticipated.    Follow Up Recommendations No PT follow up    Equipment Recommendations  None recommended by PT    Recommendations for Other Services       Precautions / Restrictions Precautions Precaution Comments: impaired vision Restrictions Weight Bearing Restrictions: No      Mobility  Bed Mobility               General bed mobility comments: Deferred  Transfers Overall transfer level: Independent Equipment used: None             General transfer comment: steady safe transfer from bed noted  Ambulation/Gait Ambulation/Gait assistance: Supervision Gait Distance  (Feet): 40 Feet Assistive device: None   Gait velocity: decreased   General Gait Details: mild antalgic gait (pt reports h/o R knee issues) with decreased stance time R LE; mild increased B lateral sway  Stairs            Wheelchair Mobility    Modified Rankin (Stroke Patients Only)       Balance Overall balance assessment: Needs assistance Sitting-balance support: No upper extremity supported;Feet supported Sitting balance-Leahy Scale: Normal Sitting balance - Comments: steady sitting reaching outside BOS   Standing balance support: No upper extremity supported;During functional activity Standing balance-Leahy Scale: Good Standing balance comment: steady ambulation noted                             Pertinent Vitals/Pain Pain Assessment: No/denies pain    Home Living Family/patient expects to be discharged to:: Private residence Living Arrangements: Alone Available Help at Discharge: Family Type of Home: House Home Access: Ramped entrance     Home Layout: One level Home Equipment: Environmental consultant - 4 wheels;Tub bench (walking stick)      Prior Function Level of Independence: Independent with assistive device(s)         Comments: Pt uses walking stick in home and rollator when outside (can sit to rest when needed).  Pt reports balance can be challenged d/t impaired vision causing him to stumble at times but no falls in past year ("85% blind" per pt report)  Hand Dominance        Extremity/Trunk Assessment   Upper Extremity Assessment Upper Extremity Assessment: Generalized weakness    Lower Extremity Assessment Lower Extremity Assessment: Generalized weakness    Cervical / Trunk Assessment Cervical / Trunk Assessment: Normal  Communication   Communication: No difficulties  Cognition Arousal/Alertness: Awake/alert Behavior During Therapy: WFL for tasks assessed/performed Overall Cognitive Status: Within Functional Limits for tasks  assessed                                        General Comments   Nursing cleared pt for participation in physical therapy.  Pt agreeable to PT session.    Exercises     Assessment/Plan    PT Assessment Patient needs continued PT services  PT Problem List Decreased strength;Decreased activity tolerance;Decreased balance;Decreased mobility;Cardiopulmonary status limiting activity;Obesity       PT Treatment Interventions DME instruction;Gait training;Functional mobility training;Therapeutic activities;Therapeutic exercise;Balance training;Patient/family education    PT Goals (Current goals can be found in the Care Plan section)  Acute Rehab PT Goals Patient Stated Goal: to improve breathing with activity PT Goal Formulation: With patient Time For Goal Achievement: 04/15/20 Potential to Achieve Goals: Fair    Frequency Min 2X/week   Barriers to discharge        Co-evaluation               AM-PAC PT "6 Clicks" Mobility  Outcome Measure Help needed turning from your back to your side while in a flat bed without using bedrails?: None Help needed moving from lying on your back to sitting on the side of a flat bed without using bedrails?: None Help needed moving to and from a bed to a chair (including a wheelchair)?: None Help needed standing up from a chair using your arms (e.g., wheelchair or bedside chair)?: None Help needed to walk in hospital room?: None Help needed climbing 3-5 steps with a railing? : A Little 6 Click Score: 23    End of Session   Activity Tolerance: Other (comment) (Limited ambulation distance d/t SOB) Patient left: with call bell/phone within reach (sitting on edge of bed) Nurse Communication: Mobility status;Precautions PT Visit Diagnosis: Muscle weakness (generalized) (M62.81);History of falling (Z91.81)    Time: 1439-1500 PT Time Calculation (min) (ACUTE ONLY): 21 min   Charges:   PT Evaluation $PT Eval Low  Complexity: 1 Low          Otoniel Myhand, PT 04/01/20, 3:17 PM

## 2020-04-01 NOTE — Plan of Care (Signed)
  Problem: Education: Goal: Emotional status will improve Outcome: Progressing   Problem: Activity: Goal: Sleeping patterns will improve Outcome: Progressing   Problem: Coping: Goal: Ability to verbalize frustrations and anger appropriately will improve Outcome: Progressing

## 2020-04-01 NOTE — Plan of Care (Signed)
Nutrition Education Note  RD consulted for nutrition education regarding CHF.  Patient awake, alert, and reports feeling a little better today. He reports good po intake of breakfast, recalls eggs, chicken sausage, and pancake. Patient reports good appetite at home, says "I can eat" Patient recalls not eating his first meal until around 11 or 12, recalls [place] sandwiches on wheat, snacks through out the day, limits intake of red meat, usually will have a hamburger once a week, and has a peanut butter sandwich before bed. Patient reports drinking 20 2L diet soda's every week, (~3 bottles/day or 6 L of fluid/daily) He keeps bottles in various places in the house, drinking them as he goes about his day.   RD provided "Low Sodium Nutrition Therapy" handout from the Academy of [employer] and [employer]etics. Reviewed patient's dietary recall. Provided examples on ways to decrease sodium intake in diet. Discouraged intake of processed foods and use of salt shaker. Encouraged fresh fruits and vegetables as well as whole grain sources of carbohydrates to maximize fiber intake.   RD discussed why it is important for patient to adhere to diet recommendations, and emphasized the role of fluids, ways to decrease daily fluid intake, foods to avoid, and importance of weighing self daily. Teach back method used.  Expect good compliance.  Body mass index is 45.84 kg/m. Pt meets criteria for morbid obesity based on current BMI.  Current diet order is HH/CM, patient is consuming approximately 100% of meals at this time. Labs and medications reviewed. No further nutrition interventions warranted at this time. RD contact information provided. If additional nutrition issues arise, please re-consult RD.   Lars Masson, RD, LDN Clinical Nutrition After Hours/Weekend Pager # in Amion

## 2020-04-01 NOTE — Patient Outreach (Signed)
  [facility]) Care Management Chronic Special Needs Program    04/01/2020  Name: [name], DOB: November 16, 1950  MRN: [id number]   [name] is enrolled in a chronic special needs plan. RNCM received notification from care management assistant that client was admitted on 03/31/2020. Acute on chronic diastolic congestive heart failure.  Care Coordination: care plan sent to [facility] utilization management team.  Plan: RNCM will continue to follow. Care Coordinate with [facility] hospital liaison and inpatient care management as indicated.  Kathyrn Sheriff, RN, MSN, [facility] Chronic Care Management Coordinator [facility] [phone]

## 2020-04-01 NOTE — ED Notes (Signed)
Report called to [facility] rn floor nurse

## 2020-04-01 NOTE — Progress Notes (Signed)
Food tray given to patient 

## 2020-04-01 NOTE — Progress Notes (Signed)
Patient ID: [name], male   DOB: 1950-12-14, 69 y.o.   MRN: [id number] [facility] PROGRESS NOTE  LAMIN CHANDLEY KPT:[id number] DOB: 05-25-1951 DOA: 03/31/2020 PCP: [name], MD  HPI/Subjective: Patient states he just has no energy.  Does not feel like doing things.  He has been gaining fluid weight and his legs are swollen. Some shortness of breath.  Does have some feet pain.  Patient's daughter states that he drinks a lot of diet soda which can be contributing to the weight gain.  Objective: Vitals:   04/01/20 0743 04/01/20 1208  BP: (!) 128/58 132/63  Pulse: 72 84  Resp: 16 17  Temp: 97.7 F (36.5 C)   SpO2: 96% 95%    Intake/Output Summary (Last 24 hours) at 04/01/2020 1408 Last data filed at 04/01/2020 1146 Gross per 24 hour  Intake 480 ml  Output 1250 ml  Net -770 ml   Filed Weights   03/31/20 1639 04/01/20 0236 04/01/20 0243  Weight: 132 kg 132.8 kg 132.8 kg    ROS: Review of Systems  Respiratory: Positive for shortness of breath.   Cardiovascular: Negative for chest pain.  Gastrointestinal: Negative for abdominal pain, nausea and vomiting.  Musculoskeletal: Positive for joint pain.   Exam: Physical Exam HENT:     Nose: No mucosal edema.     Mouth/Throat:     Pharynx: No oropharyngeal exudate.  Eyes:     General: Lids are normal.     Conjunctiva/sclera: Conjunctivae normal.     Pupils: Pupils are equal, round, and reactive to light.  Cardiovascular:     Rate and Rhythm: Normal rate and regular rhythm.     Heart sounds: Normal heart sounds, S1 normal and S2 normal.  Pulmonary:     Breath sounds: Examination of the right-lower field reveals decreased breath sounds. Examination of the left-lower field reveals decreased breath sounds. Decreased breath sounds present. No wheezing, rhonchi or rales.  Abdominal:     Palpations: Abdomen is soft.     Tenderness: There is no abdominal tenderness.  Musculoskeletal:     Right ankle: Swelling present.      Left ankle: Swelling present.  Skin:    General: Skin is warm.     Findings: No rash.  Neurological:     Mental Status: He is alert and oriented to person, place, and time.       Data Reviewed: Basic Metabolic Panel: Recent Labs  Lab 03/31/20 1641 04/01/20 0602  NA 131* 138  K 4.1 3.4*  CL 95* 99  CO2 25 27  GLUCOSE 719* 353*  BUN 24* 23  CREATININE 1.18 1.09  CALCIUM 8.6* 8.9   Liver Function Tests: Recent Labs  Lab 03/31/20 1641  AST 21  ALT 23  ALKPHOS 98  BILITOT 0.5  PROT 7.4  ALBUMIN 3.9   CBC: Recent Labs  Lab 03/31/20 1641  WBC 7.0  HGB 13.4  HCT 41.0  MCV 91.3  PLT 224    BNP (last 3 results) Recent Labs    03/31/20 2149  BNP 48.4    CBG: Recent Labs  Lab 03/31/20 2105 04/01/20 0040 04/01/20 0745 04/01/20 1210  GLUCAP 413* 338* 330* 309*    Recent Results (from the past 240 hour(s))  SARS Coronavirus 2 by RT PCR (hospital order, performed in [facility] hospital lab) Nasopharyngeal Nasopharyngeal Swab     Status: None   Collection Time: 03/31/20  9:23 PM   Specimen: Nasopharyngeal Swab  Result Value  Ref Range Status   SARS Coronavirus 2 NEGATIVE NEGATIVE Final    Comment: (NOTE) SARS-CoV-2 target nucleic acids are NOT DETECTED.  The SARS-CoV-2 RNA is generally detectable in upper and lower respiratory specimens during the acute phase of infection. The lowest concentration of SARS-CoV-2 viral copies this assay can detect is 250 copies / mL. A negative result does not preclude SARS-CoV-2 infection and should not be used as the sole basis for treatment or other patient management decisions.  A negative result may occur with improper specimen collection / handling, submission of specimen other than nasopharyngeal swab, presence of viral mutation(s) within the areas targeted by this assay, and inadequate number of viral copies (<250 copies / mL). A negative result must be combined with clinical observations, patient history, and  epidemiological information.  Fact Sheet for Patients:   BoilerBrush.com.cy  Fact Sheet for Healthcare Providers: https://pope.com/  This test is not yet approved or  cleared by the [place] FDA and has been authorized for detection and/or diagnosis of SARS-CoV-2 by FDA under an Emergency Use Authorization (EUA).  This EUA will remain in effect (meaning this test can be used) for the duration of the COVID-19 declaration under Section 564(b)(1) of the Act, 21 U.S.C. section 360bbb-3(b)(1), unless the authorization is terminated or revoked sooner.  Performed at [facility], [address] [place], [place] [address]      Studies: DG Chest 2 View  Result Date: 03/31/2020 CLINICAL DATA:  Shortness of breath, chest pain and tightness, dyspnea on exertion EXAM: CHEST - 2 VIEW COMPARISON:  Radiograph 09/21/2016, CT 09/01/2009 FINDINGS: There is cardiomegaly and central pulmonary vascular congestion with cephalization. Some diffuse hazy opacities are present throughout the lungs with basilar and perihilar predominance. More focal consolidative opacity is seen in the retrocardiac space and right infrahilar lung. No pneumothorax or visible effusion. Cardiomediastinal contours are otherwise unremarkable. No acute osseous or soft tissue abnormality. Degenerative changes are present in the imaged spine and shoulders. IMPRESSION: Features suggesting CHF/volume overload with cardiomegaly, vascular congestion and interstitial opacities which could reflect some mild interstitial edema. Additional patchy consolidative opacities in the lungs could reflect developing alveolar edema or infection. Electronically Signed   By: Kreg Shropshire M.D.   On: 03/31/2020 17:07   ECHOCARDIOGRAM COMPLETE  Result Date: 04/01/2020    ECHOCARDIOGRAM REPORT   Patient Name:   [name] Date of Exam: 04/01/2020 Medical Rec #:  [id number]     Height:       67.0 in  Accession #:    [id number]    Weight:       292.7 lb Date of Birth:  26-Nov-1950     BSA:          2.378 m Patient Age:    69 years      BP:           128/58 mmHg Patient Gender: M             HR:           72 bpm. Exam Location:  [facility] Procedure: 2D Echo, Cardiac Doppler and Color Doppler Indications:     CHF- acute diastolic 428.31  History:         Patient has prior history of Echocardiogram examinations, most                  recent 03/09/2016. CHF, CAD; Risk Factors:Hypertension and  Diabetes.  Sonographer:     Cristela BlueJerry Hege RDCS (AE) Referring Phys:  [id number] Andris BaumannHAZEL V DUNCAN Diagnosing Phys: Harold HedgeKenneth Fath MD  Sonographer Comments: Suboptimal apical window and no subcostal window. IMPRESSIONS  1. Left ventricular ejection fraction, by estimation, is 60 to 65%. Left ventricular ejection fraction by PLAX is 61 %. The left ventricle has normal function. The left ventricle has no regional wall motion abnormalities. There is mild left ventricular hypertrophy. Left ventricular diastolic parameters are consistent with Grade I diastolic dysfunction (impaired relaxation).  2. Right ventricular systolic function is normal. The right ventricular size is normal. There is normal pulmonary artery systolic pressure.  3. Left atrial size was mildly dilated.  4. Right atrial size was mildly dilated.  5. The mitral valve was not well visualized. Trivial mitral valve regurgitation.  6. The aortic valve was not well visualized. Aortic valve regurgitation is not visualized. FINDINGS  Left Ventricle: Left ventricular ejection fraction, by estimation, is 60 to 65%. Left ventricular ejection fraction by PLAX is 61 %. The left ventricle has normal function. The left ventricle has no regional wall motion abnormalities. The left ventricular internal cavity size was normal in size. There is mild left ventricular hypertrophy. Left ventricular diastolic parameters are consistent with Grade I diastolic dysfunction (impaired  relaxation). Right Ventricle: The right ventricular size is normal. No increase in right ventricular wall thickness. Right ventricular systolic function is normal. There is normal pulmonary artery systolic pressure. The tricuspid regurgitant velocity is 1.21 m/s, and  with an assumed right atrial pressure of 10 mmHg, the estimated right ventricular systolic pressure is 15.9 mmHg. Left Atrium: Left atrial size was mildly dilated. Right Atrium: Right atrial size was mildly dilated. Pericardium: There is no evidence of pericardial effusion. Mitral Valve: The mitral valve was not well visualized. Trivial mitral valve regurgitation. Tricuspid Valve: The tricuspid valve is not well visualized. Tricuspid valve regurgitation is trivial. Aortic Valve: The aortic valve was not well visualized. Aortic valve regurgitation is not visualized. Aortic valve mean gradient measures 4.5 mmHg. Aortic valve peak gradient measures 7.7 mmHg. Aortic valve area, by VTI measures 2.53 cm. Pulmonic Valve: The pulmonic valve was not well visualized. Pulmonic valve regurgitation is trivial. Aorta: The aortic root is normal in size and structure. IAS/Shunts: The interatrial septum was not assessed.  LEFT VENTRICLE PLAX 2D LV EF:         Left            Diastology                ventricular     LV e' lateral:   9.57 cm/s                ejection        LV E/e' lateral: 7.5                fraction by     LV e' medial:    4.46 cm/s                PLAX is 61      LV E/e' medial:  16.1                %. LVIDd:         4.04 cm LVIDs:         2.75 cm LV PW:         1.48 cm LV IVS:        1.76 cm LVOT diam:  2.10 cm LV SV:         57 LV SV Index:   24 LVOT Area:     3.46 cm  RIGHT VENTRICLE RV S prime:     15.10 cm/s TAPSE (M-mode): 3.8 cm LEFT ATRIUM           Index       RIGHT ATRIUM           Index LA diam:      4.30 cm 1.81 cm/m  RA Area:     24.30 cm LA Vol (A4C): 67.2 ml 28.26 ml/m RA Volume:   78.00 ml  32.81 ml/m  AORTIC VALVE                     PULMONIC VALVE AV Area (Vmax):    2.23 cm     PV Vmax:        1.00 m/s AV Area (Vmean):   2.13 cm     PV Peak grad:   4.0 mmHg AV Area (VTI):     2.53 cm     RVOT Peak grad: 5 mmHg AV Vmax:           139.00 cm/s AV Vmean:          101.200 cm/s AV VTI:            0.226 m AV Peak Grad:      7.7 mmHg AV Mean Grad:      4.5 mmHg LVOT Vmax:         89.40 cm/s LVOT Vmean:        62.100 cm/s LVOT VTI:          0.165 m LVOT/AV VTI ratio: 0.73  AORTA Ao Root diam: 3.30 cm MITRAL VALVE                TRICUSPID VALVE MV Area (PHT): 2.83 cm     TR Peak grad:   5.9 mmHg MV Decel Time: 268 msec     TR Vmax:        121.00 cm/s MV E velocity: 71.80 cm/s MV A velocity: 122.00 cm/s  SHUNTS MV E/A ratio:  0.59         Systemic VTI:  0.16 m                             Systemic Diam: 2.10 cm Harold Hedge MD Electronically signed by Harold Hedge MD Signature Date/Time: 04/01/2020/10:15:17 AM    Final     Scheduled Meds: . aspirin EC  81 mg Oral Daily  . carvedilol  6.25 mg Oral Daily  . cholecalciferol  2,000 Units Oral Daily  . clopidogrel  75 mg Oral Daily  . enoxaparin (LOVENOX) injection  40 mg Subcutaneous Q24H  . ezetimibe  10 mg Oral Daily  . furosemide  60 mg Intravenous BID  . insulin aspart  0-20 Units Subcutaneous TID WC  . insulin aspart protamine- aspart  30 Units Subcutaneous Q supper  . insulin aspart protamine- aspart  50 Units Subcutaneous Q breakfast  . isosorbide mononitrate  30 mg Oral BID  . losartan  50 mg Oral Daily  . potassium chloride SA  20 mEq Oral BID  . sodium chloride flush  3 mL Intravenous Q12H  . traZODone  50 mg Oral QHS  . venlafaxine XR  150 mg Oral Q breakfast  . venlafaxine XR  75 mg Oral Q  breakfast   Continuous Infusions: . sodium chloride      Assessment/Plan:  1. Acute on chronic diastolic congestive heart failure with lower extremity edema.  Patient on Lasix 60 mg IV twice daily.  Continue Coreg and losartan. 2. Chest pain.  This has resolved.  Troponin  stayed on the lower side. 3. Type 2 diabetes mellitus with hyperglycemia with elevated hemoglobin A1c.  Patient started on 70/30 insulin.  A1c very elevated at 11.0 4. Essential hypertension on Coreg, Lasix and losartan 5. Morbid obesity with a BMI of 45.84.  Weight loss needed 6. Depression on Effexor.  No suicidal homicidal ideation.  Patient does not want to speak with a psychiatrist at this time. The patient has been down since his wife passed away around 3-1/2 years ago.      Code Status:     Code Status Orders  (From admission, onward)         Start     Ordered   03/31/20 2140  Full code  Continuous        03/31/20 2143        Code Status History    Date Active Date Inactive Code Status Order ID Comments User Context   09/20/2016 0951 09/21/2016 1955 Full Code [id number]  Altamese Dilling, MD ED   04/06/2016 0128 04/07/2016 1436 Full Code [id number]  Nevin Bloodgood Inpatient   03/09/2016 1034 03/11/2016 1522 Full Code [id number]  Alford Highland, MD ED   12/25/2015 1344 12/26/2015 1408 Full Code [id number]  Alwyn Pea, MD Inpatient   12/24/2015 1814 12/25/2015 1344 Full Code [id number]  Enedina Finner, MD Inpatient   Advance Care Planning Activity     Family Communication: Spoke with daughter on the phone Disposition Plan: Status is: Inpatient  Dispo: The patient is from: Home              Anticipated d/c is to: Home              Anticipated d/c date is: Potentially 04/03/2020 depending on how he is doing and how he is feeling              Patient currently being treated for acute diastolic congestive heart failure with IV Lasix.    Of note the patient did not receive a dose of IV Lasix that was ordered in the emergency room.  Pharmacy technician to report into the safety zone.  Time spent: 28 minutes  [employer]

## 2020-04-02 DIAGNOSIS — I10 Essential (primary) hypertension: Secondary | ICD-10-CM

## 2020-04-02 LAB — BASIC METABOLIC PANEL
Anion gap: 9 (ref 5–15)
BUN: 27 mg/dL — ABNORMAL HIGH (ref 8–23)
CO2: 30 mmol/L (ref 22–32)
Calcium: 8.9 mg/dL (ref 8.9–10.3)
Chloride: 99 mmol/L (ref 98–111)
Creatinine, Ser: 1.12 mg/dL (ref 0.61–1.24)
GFR calc Af Amer: >60 mL/min (ref >60)
GFR calc non Af Amer: >60 mL/min (ref >60)
Glucose, Bld: 219 mg/dL — ABNORMAL HIGH (ref 70–99)
Potassium: 4.4 mmol/L (ref 3.5–5.1)
Sodium: 138 mmol/L (ref 135–145)

## 2020-04-02 LAB — GLUCOSE, CAPILLARY: Glucose-Capillary: 226 mg/dL — ABNORMAL HIGH (ref 70–99)

## 2020-04-02 LAB — URIC ACID: Uric Acid, Serum: 6.1 mg/dL (ref 3.7–8.6)

## 2020-04-02 MED ORDER — NOVOLIN 70/30 (70-30) 100 UNIT/ML ~~LOC~~ SUSP: SUBCUTANEOUS | 5 refills | Status: DC

## 2020-04-02 MED ORDER — FUROSEMIDE 40 MG PO TABS: 40.0000 mg | ORAL_TABLET | Freq: Two times a day (BID) | ORAL | 0 refills | Status: DC

## 2020-04-02 NOTE — Progress Notes (Signed)
Inpatient Diabetes Program Recommendations  AACE/ADA: New Consensus Statement on Inpatient Glycemic Control  Target Ranges:  Prepandial:   less than 140 mg/dL      Peak postprandial:   less than 180 mg/dL (1-2 hours)      Critically ill patients:  140 - 180 mg/dL   Results for HENDERSON, FRAMPTON (MRN [id number]) as of 04/02/2020 09:02  Ref. Range 04/01/2020 07:45 04/01/2020 12:10 04/01/2020 16:40 04/01/2020 21:24 04/02/2020 07:57  Glucose-Capillary Latest Ref Range: 70 - 99 mg/dL 884 (H) 166 (H) 063 (H) 173 (H) 226 (H)   Review of Glycemic Control  Diabetes history: DM2 Outpatient Diabetes medications: 70/30 70 units QAM, 70/30 40 units QPM Current orders for Inpatient glycemic control: 70/30 50 units QAM, 70/30 30 units QPM, Novolog 0-20 units TID with meals  Inpatient Diabetes Program Recommendations:    Insulin-Please consider increasing 70/30 to 60 units QAM with breakfast and 70/30 35 units QPM with supper.  Thanks, Orlando Penner, RN, MSN, CDE Diabetes Coordinator Inpatient Diabetes Program [phone] (Team Pager from 8am to 5pm)

## 2020-04-02 NOTE — Discharge Summary (Signed)
[facility] [place] NAME: [name]    MR#:  [id number]  DATE OF BIRTH:  Apr 15, 1951  DATE OF ADMISSION:  03/31/2020 ADMITTING PHYSICIAN: Athena Masse, MD  DATE OF DISCHARGE: 04/02/2020 12:28 PM  PRIMARY CARE PHYSICIAN: Jinny Sanders, MD    ADMISSION DIAGNOSIS:  Acute pulmonary edema (HCC) [J81.0] Weakness [R53.1] Hyperglycemia [R73.9] Acute on chronic diastolic (congestive) heart failure (HCC) [I50.33] Chest pain, unspecified type [R07.9]  DISCHARGE DIAGNOSIS:  Principal Problem:   Acute on chronic diastolic CHF (congestive heart failure) (HCC) Active Problems:   Essential hypertension, benign   Morbid obesity (HCC)   Coronary artery disease, non-occlusive   Chest pain   Hyperglycemia due to type 2 diabetes mellitus (HCC)   Acute on chronic diastolic (congestive) heart failure ([place])   Depression   SECONDARY DIAGNOSIS:   Past Medical History:  Diagnosis Date  . Back injury 02/2002   worker's comp  . CHF (congestive heart failure) ([place])   . Coronary artery disease, non-occlusive    a. cath 2002 with no sig CAD;  b. cath 2008 normal LM, LAD, LCx, p&dRCA 20-30%, PDA 30%; c.11/2015 NSTEMI/PCI: LM nl, LAD 95p (2.5x15 Xience DES), LCX nl, RCA 100p/m w/ L->R collats, EF 55-65% c. NSTEMI (02/2016) with no culprit leision, switched to Brilinta.  d. NSTEMI 03/2016: again, no culprit lesion and switched back to plavix 2/2 SOB with Brilnta.    . Depression   . Diabetes mellitus type 2, insulin dependent ([place])   . Hyperlipemia   . Hypertensive heart disease   . Kidney stones   . Morbid obesity ([place])   . Osteoarthritis   . Snoring     HOSPITAL COURSE:   1.  Acute on chronic diastolic congestive heart failure with lower extremity edema.  He did have the patient on Lasix 60 mg IV twice a day.  Patient already on Coreg and losartan.  Patient was feeling better and wanted to go home on the morning of 04/02/2020.  Follow-up with Dr. Rockey Situ  cardiology as outpatient.  Follows up with CHF clinic.  Lasix switched over to oral twice a day. 2.  Chest pain this is resolved.  Troponin on the lower side. 3.  Type 2 diabetes mellitus with hyperglycemia with elevated hemoglobin A1c.  Patient restarted on 70/30 insulin.  Patient states he has it at home and he does take it.  Diabetes coordinator helped out with dosing.  Dietitian helped out with diet.  Patient advised by myself to stop drinking soda. 4.  Essential hypertension on Coreg, Lasix and losartan 5.  Morbid obesity with a BMI of 45.6 2. 6.  Depression on Effexor.  DISCHARGE CONDITIONS:   Satisfactory  CONSULTS OBTAINED:  None  DRUG ALLERGIES:   Allergies  Allergen Reactions  . Atorvastatin Other (See Comments)    Body aches    DISCHARGE MEDICATIONS:   Allergies as of 04/02/2020      Reactions   Atorvastatin Other (See Comments)   Body aches      Medication List    TAKE these medications   aspirin 81 MG EC tablet Take 1 tablet (81 mg total) by mouth daily.   carvedilol 6.25 MG tablet Commonly known as: COREG Take 1 tablet (6.25 mg total) by mouth daily.   cholecalciferol 25 MCG (1000 UNIT) tablet Commonly known as: VITAMIN D3 Take 2,000 Units by mouth daily.   clopidogrel 75 MG tablet Commonly known as: PLAVIX TAKE 1 TABLET BY  MOUTH ONCE DAILY WITH BREAKFAST   ezetimibe 10 MG tablet Commonly known as: ZETIA Take 1 tablet (10 mg total) by mouth daily.   FreeStyle Libre 14 Day Reader Kerrin Mo 1 Device by Does not apply route every 14 (fourteen) days.   FreeStyle Libre 14 Day Sensor Misc 1 Device by Does not apply route every 14 (fourteen) days.   furosemide 40 MG tablet Commonly known as: LASIX Take 1 tablet (40 mg total) by mouth 2 (two) times daily. Can take a 2nd daily dose as needed. What changed: when to take this   isosorbide mononitrate 30 MG 24 hr tablet Commonly known as: IMDUR Take 1 tablet (30 mg total) by mouth 2 (two) times daily.    losartan 50 MG tablet Commonly known as: COZAAR Take 1 tablet (50 mg total) by mouth daily.   nitroGLYCERIN 0.4 MG SL tablet Commonly known as: NITROSTAT DISSOLVE 1 TABLET UNDER TONGUE AS NEEDEDFOR CHEST PAIN. MAY REPEAT 5 MINUTES APART 3 TIMES IF NEEDED   NovoLIN 70/30 (70-30) 100 UNIT/ML injection Generic drug: insulin NPH-regular Human Inject 60 units in the morning and 35 units at bedtime. What changed: additional instructions   OneTouch Delica Plus [id number] Misc   OneTouch Verio Flex System w/Device Kit   OneTouch Verio High Soln   OneTouch Verio test strip Generic drug: glucose blood   potassium chloride SA 20 MEQ tablet Commonly known as: KLOR-CON Take 1 tablet (20 mEq total) by mouth daily.   venlafaxine XR 75 MG 24 hr capsule Commonly known as: EFFEXOR-XR TAKE 1 CAPSULE BY MOUTH ONCE DAILY. TAKEIN ADDITION TO THE 150 MG CAPSULE TO EQUAL A TOTAL DOSE OF 225 MG DAILY   venlafaxine XR 150 MG 24 hr capsule Commonly known as: EFFEXOR-XR TAKE 1 CAPSULE BY MOUTH ONCE DAILY WITH BREAKFAST. TAKE WITH 75 MG DOSE TO EQUAL 225MG DAILY        DISCHARGE INSTRUCTIONS:   Follow-up PMD 5 days Follow-up CHF clinic and cardiology as outpatient  If you experience worsening of your admission symptoms, develop shortness of breath, life threatening emergency, suicidal or homicidal thoughts you must seek medical attention immediately by calling 911 or calling your MD immediately  if symptoms less severe.  You Must read complete instructions/literature along with all the possible adverse reactions/side effects for all the Medicines you take and that have been prescribed to you. Take any new Medicines after you have completely understood and accept all the possible adverse reactions/side effects.   Please note  You were cared for by a hospitalist during your hospital stay. If you have any questions about your discharge medications or the care you received while you were in the  hospital after you are discharged, you can call the unit and asked to speak with the hospitalist on call if the hospitalist that took care of you is not available. Once you are discharged, your primary care physician will handle any further medical issues. Please note that NO REFILLS for any discharge medications will be authorized once you are discharged, as it is imperative that you return to your primary care physician (or establish a relationship with a primary care physician if you do not have one) for your aftercare needs so that they can reassess your need for medications and monitor your lab values.    Today   CHIEF COMPLAINT:   Chief Complaint  Patient presents with  . Hyperglycemia  . Chest Pain  . Weakness    HISTORY OF PRESENT ILLNESS:  [name]  Every  is a 69 y.o. male came in with chest pain shortness of breath weakness and swelling.   VITAL SIGNS:  Blood pressure (!) 137/50, pulse 79, temperature 97.7 F (36.5 C), temperature source Oral, resp. rate 16, height 5' 7" (1.702 m), weight 132.1 kg, SpO2 91 %.  I/O:    Intake/Output Summary (Last 24 hours) at 04/02/2020 1550 Last data filed at 04/02/2020 1015 Gross per 24 hour  Intake 240 ml  Output 1800 ml  Net -1560 ml    PHYSICAL EXAMINATION:  GENERAL:  69 y.o.-year-old patient lying in the bed with no acute distress.  EYES: Pupils equal, round, reactive to light and accommodation. No scleral icterus. Extraocular muscles intact.  HEENT: Head atraumatic, normocephalic. Oropharynx and nasopharynx clear.  LUNGS: Normal breath sounds bilaterally, no wheezing, rales,rhonchi or crepitation. No use of accessory muscles of respiration.  CARDIOVASCULAR: S1, S2 normal. No murmurs, rubs, or gallops.  ABDOMEN: Soft, non-tender, non-distended. Bowel sounds present. No organomegaly or mass.  EXTREMITIES: Trace pedal edema.  NEUROLOGIC: Cranial nerves II through XII are intact. Muscle strength 5/5 in all extremities. Sensation intact.  Gait not checked.  PSYCHIATRIC: The patient is alert and oriented x 3.  SKIN: No obvious rash, lesion, or ulcer.   DATA REVIEW:   CBC Recent Labs  Lab 03/31/20 1641  WBC 7.0  HGB 13.4  HCT 41.0  PLT 224    Chemistries  Recent Labs  Lab 03/31/20 1641 04/01/20 0602 04/02/20 0617  NA 131*   < > 138  K 4.1   < > 4.4  CL 95*   < > 99  CO2 25   < > 30  GLUCOSE 719*   < > 219*  BUN 24*   < > 27*  CREATININE 1.18   < > 1.12  CALCIUM 8.6*   < > 8.9  AST 21  --   --   ALT 23  --   --   ALKPHOS 98  --   --   BILITOT 0.5  --   --    < > = values in this interval not displayed.     Microbiology Results  Results for orders placed or performed during the hospital encounter of 03/31/20  SARS Coronavirus 2 by RT PCR (hospital order, performed in [facility] hospital lab) Nasopharyngeal Nasopharyngeal Swab     Status: None   Collection Time: 03/31/20  9:23 PM   Specimen: Nasopharyngeal Swab  Result Value Ref Range Status   SARS Coronavirus 2 NEGATIVE NEGATIVE Final    Comment: (NOTE) SARS-CoV-2 target nucleic acids are NOT DETECTED.  The SARS-CoV-2 RNA is generally detectable in upper and lower respiratory specimens during the acute phase of infection. The lowest concentration of SARS-CoV-2 viral copies this assay can detect is 250 copies / mL. A negative result does not preclude SARS-CoV-2 infection and should not be used as the sole basis for treatment or other patient management decisions.  A negative result may occur with improper specimen collection / handling, submission of specimen other than nasopharyngeal swab, presence of viral mutation(s) within the areas targeted by this assay, and inadequate number of viral copies (<250 copies / mL). A negative result must be combined with clinical observations, patient history, and epidemiological information.  Fact Sheet for Patients:   StrictlyIdeas.no  Fact Sheet for Healthcare  Providers: BankingDealers.co.za  This test is not yet approved or  cleared by the [place] FDA and has been authorized for detection and/or diagnosis of  SARS-CoV-2 by FDA under an Emergency Use Authorization (EUA).  This EUA will remain in effect (meaning this test can be used) for the duration of the COVID-19 declaration under Section 564(b)(1) of the Act, 21 U.S.C. section 360bbb-3(b)(1), unless the authorization is terminated or revoked sooner.  Performed at [facility], [address] [place], [place] [address]     RADIOLOGY:  DG Chest 2 View  Result Date: 03/31/2020 CLINICAL DATA:  Shortness of breath, chest pain and tightness, dyspnea on exertion EXAM: CHEST - 2 VIEW COMPARISON:  Radiograph 09/21/2016, CT 09/01/2009 FINDINGS: There is cardiomegaly and central pulmonary vascular congestion with cephalization. Some diffuse hazy opacities are present throughout the lungs with basilar and perihilar predominance. More focal consolidative opacity is seen in the retrocardiac space and right infrahilar lung. No pneumothorax or visible effusion. Cardiomediastinal contours are otherwise unremarkable. No acute osseous or soft tissue abnormality. Degenerative changes are present in the imaged spine and shoulders. IMPRESSION: Features suggesting CHF/volume overload with cardiomegaly, vascular congestion and interstitial opacities which could reflect some mild interstitial edema. Additional patchy consolidative opacities in the lungs could reflect developing alveolar edema or infection. Electronically Signed   By: Lovena Le M.D.   On: 03/31/2020 17:07   ECHOCARDIOGRAM COMPLETE  Result Date: 04/01/2020    ECHOCARDIOGRAM REPORT   Patient Name:   [name] Date of Exam: 04/01/2020 Medical Rec #:  [id number]     Height:       67.0 in Accession #:    [id number]    Weight:       292.7 lb Date of Birth:  December 22, 1950     BSA:          2.378 m Patient Age:    69 years       BP:           128/58 mmHg Patient Gender: M             HR:           72 bpm. Exam Location:  [facility] Procedure: 2D Echo, Cardiac Doppler and Color Doppler Indications:     CHF- acute diastolic 500.37  History:         Patient has prior history of Echocardiogram examinations, most                  recent 03/09/2016. CHF, CAD; Risk Factors:Hypertension and                  Diabetes.  Sonographer:     Sherrie Sport RDCS (AE) Referring Phys:  [id number] Athena Masse Diagnosing Phys: Bartholome Bill MD  Sonographer Comments: Suboptimal apical window and no subcostal window. IMPRESSIONS  1. Left ventricular ejection fraction, by estimation, is 60 to 65%. Left ventricular ejection fraction by PLAX is 61 %. The left ventricle has normal function. The left ventricle has no regional wall motion abnormalities. There is mild left ventricular hypertrophy. Left ventricular diastolic parameters are consistent with Grade I diastolic dysfunction (impaired relaxation).  2. Right ventricular systolic function is normal. The right ventricular size is normal. There is normal pulmonary artery systolic pressure.  3. Left atrial size was mildly dilated.  4. Right atrial size was mildly dilated.  5. The mitral valve was not well visualized. Trivial mitral valve regurgitation.  6. The aortic valve was not well visualized. Aortic valve regurgitation is not visualized. FINDINGS  Left Ventricle: Left ventricular ejection fraction, by estimation, is 60 to 65%. Left ventricular ejection fraction  by PLAX is 61 %. The left ventricle has normal function. The left ventricle has no regional wall motion abnormalities. The left ventricular internal cavity size was normal in size. There is mild left ventricular hypertrophy. Left ventricular diastolic parameters are consistent with Grade I diastolic dysfunction (impaired relaxation). Right Ventricle: The right ventricular size is normal. No increase in right ventricular wall thickness. Right ventricular systolic  function is normal. There is normal pulmonary artery systolic pressure. The tricuspid regurgitant velocity is 1.21 m/s, and  with an assumed right atrial pressure of 10 mmHg, the estimated right ventricular systolic pressure is 46.5 mmHg. Left Atrium: Left atrial size was mildly dilated. Right Atrium: Right atrial size was mildly dilated. Pericardium: There is no evidence of pericardial effusion. Mitral Valve: The mitral valve was not well visualized. Trivial mitral valve regurgitation. Tricuspid Valve: The tricuspid valve is not well visualized. Tricuspid valve regurgitation is trivial. Aortic Valve: The aortic valve was not well visualized. Aortic valve regurgitation is not visualized. Aortic valve mean gradient measures 4.5 mmHg. Aortic valve peak gradient measures 7.7 mmHg. Aortic valve area, by VTI measures 2.53 cm. Pulmonic Valve: The pulmonic valve was not well visualized. Pulmonic valve regurgitation is trivial. Aorta: The aortic root is normal in size and structure. IAS/Shunts: The interatrial septum was not assessed.  LEFT VENTRICLE PLAX 2D LV EF:         Left            Diastology                ventricular     LV e' lateral:   9.57 cm/s                ejection        LV E/e' lateral: 7.5                fraction by     LV e' medial:    4.46 cm/s                PLAX is 61      LV E/e' medial:  16.1                %. LVIDd:         4.04 cm LVIDs:         2.75 cm LV PW:         1.48 cm LV IVS:        1.76 cm LVOT diam:     2.10 cm LV SV:         57 LV SV Index:   24 LVOT Area:     3.46 cm  RIGHT VENTRICLE RV S prime:     15.10 cm/s TAPSE (M-mode): 3.8 cm LEFT ATRIUM           Index       RIGHT ATRIUM           Index LA diam:      4.30 cm 1.81 cm/m  RA Area:     24.30 cm LA Vol (A4C): 67.2 ml 28.26 ml/m RA Volume:   78.00 ml  32.81 ml/m  AORTIC VALVE                    PULMONIC VALVE AV Area (Vmax):    2.23 cm     PV Vmax:        1.00 m/s AV Area (Vmean):   2.13 cm     PV  Peak grad:   4.0 mmHg AV Area  (VTI):     2.53 cm     RVOT Peak grad: 5 mmHg AV Vmax:           139.00 cm/s AV Vmean:          101.200 cm/s AV VTI:            0.226 m AV Peak Grad:      7.7 mmHg AV Mean Grad:      4.5 mmHg LVOT Vmax:         89.40 cm/s LVOT Vmean:        62.100 cm/s LVOT VTI:          0.165 m LVOT/AV VTI ratio: 0.73  AORTA Ao Root diam: 3.30 cm MITRAL VALVE                TRICUSPID VALVE MV Area (PHT): 2.83 cm     TR Peak grad:   5.9 mmHg MV Decel Time: 268 msec     TR Vmax:        121.00 cm/s MV E velocity: 71.80 cm/s MV A velocity: 122.00 cm/s  SHUNTS MV E/A ratio:  0.59         Systemic VTI:  0.16 m                             Systemic Diam: 2.10 cm Bartholome Bill MD Electronically signed by Bartholome Bill MD Signature Date/Time: 04/01/2020/10:15:17 AM    Final     Management plans discussed with the patient, family and they are in agreement.  CODE STATUS:     Code Status Orders  (From admission, onward)         Start     Ordered   03/31/20 2140  Full code  Continuous        03/31/20 2143        Code Status History    Date Active Date Inactive Code Status Order ID Comments User Context   09/20/2016 0951 09/21/2016 1955 Full Code [id number]  Vaughan Basta, MD ED   04/06/2016 0128 04/07/2016 1436 Full Code [id number]  Wandra [name] Inpatient   03/09/2016 1034 03/11/2016 1522 Full Code [id number]  Loletha Grayer, MD ED   12/25/2015 1344 12/26/2015 1408 Full Code [id number]  Yolonda Kida, MD Inpatient   12/24/2015 1814 12/25/2015 1344 Full Code [id number]  Fritzi Mandes, MD Inpatient   Advance Care Planning Activity      TOTAL TIME TAKING CARE OF THIS PATIENT: 34 minutes.    Loletha Grayer M.D on 04/02/2020 at 3:50 PM  Between 7am to 6pm - Pager - [phone]  After 6pm go to [email or web address] - password [email or web address]  [facility]  CC: Primary care physician; Jinny Sanders, MD

## 2020-04-02 NOTE — Plan of Care (Signed)
Discharge instructions provided to pt.  All questions addressed.  Understanding verified through teach back.  Awaiting transportation home via POV.   Problem: Education: Goal: Knowledge of [facility] Education information/materials will improve Outcome: Adequate for Discharge Goal: Emotional status will improve Outcome: Adequate for Discharge Goal: Mental status will improve Outcome: Adequate for Discharge Goal: Verbalization of understanding the information provided will improve Outcome: Adequate for Discharge   Problem: Activity: Goal: Interest or engagement in activities will improve Outcome: Adequate for Discharge Goal: Sleeping patterns will improve Outcome: Adequate for Discharge   Problem: Coping: Goal: Ability to verbalize frustrations and anger appropriately will improve Outcome: Adequate for Discharge Goal: Ability to demonstrate self-control will improve Outcome: Adequate for Discharge   Problem: Health Behavior/Discharge Planning: Goal: Identification of resources available to assist in meeting health care needs will improve Outcome: Adequate for Discharge Goal: Compliance with treatment plan for underlying cause of condition will improve Outcome: Adequate for Discharge   Problem: Physical Regulation: Goal: Ability to maintain clinical measurements within normal limits will improve Outcome: Adequate for Discharge   Problem: Safety: Goal: Periods of time without injury will increase Outcome: Adequate for Discharge   Problem: Acute Rehab PT Goals(only PT should resolve) Goal: Pt Will Ambulate Outcome: Adequate for Discharge Goal: Pt/caregiver will Perform Home Exercise Program Outcome: Adequate for Discharge   Problem: Education: Goal: Knowledge of General Education information will improve Description: Including pain rating scale, medication(s)/side effects and non-pharmacologic comfort measures Outcome: Adequate for Discharge   Problem: Health  Behavior/Discharge Planning: Goal: Ability to manage health-related needs will improve Outcome: Adequate for Discharge   Problem: Clinical Measurements: Goal: Ability to maintain clinical measurements within normal limits will improve Outcome: Adequate for Discharge Goal: Will remain free from infection Outcome: Adequate for Discharge Goal: Diagnostic test results will improve Outcome: Adequate for Discharge Goal: Respiratory complications will improve Outcome: Adequate for Discharge Goal: Cardiovascular complication will be avoided Outcome: Adequate for Discharge   Problem: Activity: Goal: Risk for activity intolerance will decrease Outcome: Adequate for Discharge   Problem: Nutrition: Goal: Adequate nutrition will be maintained Outcome: Adequate for Discharge   Problem: Coping: Goal: Level of anxiety will decrease Outcome: Adequate for Discharge   Problem: Elimination: Goal: Will not experience complications related to bowel motility Outcome: Adequate for Discharge Goal: Will not experience complications related to urinary retention Outcome: Adequate for Discharge   Problem: Pain Managment: Goal: General experience of comfort will improve Outcome: Adequate for Discharge   Problem: Safety: Goal: Ability to remain free from injury will improve Outcome: Adequate for Discharge   Problem: Skin Integrity: Goal: Risk for impaired skin integrity will decrease Outcome: Adequate for Discharge

## 2020-04-02 NOTE — Discharge Instructions (Signed)
Fluid restrict per day Stop drinking soda

## 2020-04-03 ENCOUNTER — Other Ambulatory Visit: Payer: Self-pay

## 2020-04-03 ENCOUNTER — Telehealth: Payer: Self-pay

## 2020-04-03 NOTE — Telephone Encounter (Signed)
Transition Care Management Follow-up Telephone Call  Date of discharge and from where: 04/02/2020, [facility]  How have you been since you were released from the hospital? Patient is doing fine just tired.  Any questions or concerns? No  Items Reviewed:  Did the pt receive and understand the discharge instructions provided? Yes   Medications obtained and verified? Yes   Any new allergies since your discharge? No   Dietary orders reviewed? Yes  Do you have support at home? Yes   Functional Questionnaire: (I = Independent and D = Dependent) ADLs: I  Bathing/Dressing- I  Meal Prep- I  Eating- I  Maintaining continence- I  Transferring/Ambulation- I  Managing Meds- I  Follow up appointments reviewed:   PCP Hospital f/u appt confirmed? Yes  Scheduled to see Dr. Ermalene Searing on 04/21/2020 @ 2:40 pm. Patient cancelled appointment on 8/13 because he has another appointment scheduled that day.  Specialist Hospital f/u appt confirmed? Yes  Scheduled to see cardiology   Are transportation arrangements needed? No   If their condition worsens, is the pt aware to call PCP or go to the Emergency Dept.? Yes  Was the patient provided with contact information for the PCP's office or ED? Yes  Was to pt encouraged to call back with questions or concerns? Yes

## 2020-04-03 NOTE — Patient Outreach (Addendum)
  [facility]) Care Management Chronic Special Needs Program    04/03/2020  Name: [name], DOB: 11-02-1950  MRN: [id number]   [name] is enrolled in a chronic special needs plan for Diabetes. Client admitted 03/31/20-04/02/20- acute pulmonary edema/acute on chronic heart failure. Client will receive transition of care call per [facility] discharge. RNCM will follow up on any red flags as indicated. Care plan updated.  Goals Addressed            This Visit's Progress   . General - Client will not be readmitted within 30 days (C-SNP)- discharge date 04/02/2020   On track    Please follow discharge instructions and call provider if you have any questions. Please attend all follow up appointments as scheduled. Please take your medications as prescribed. Please call 24 hour nurse advice line as needed. [phone]).       Plan: send updated care plan to client, send updated care plan to primary care provider. RNCM will outreach to client within 1-2 months or sooner as indicated.  Kathyrn Sheriff, RN, MSN, [facility] Chronic Care Management Coordinator [facility] [phone]

## 2020-04-09 ENCOUNTER — Ambulatory Visit: Payer: HMO | Admitting: Physician Assistant

## 2020-04-09 ENCOUNTER — Telehealth: Payer: Self-pay | Admitting: Family

## 2020-04-09 NOTE — Telephone Encounter (Signed)
Spoke to patient who said he is doing well since discharged from the hospital. He as no complaints at this time, is taking his medications and checking weight daily. He is trying to stay active and follow a low sodium diet as much as he can. HE confirmed his new patient CHF Clinic appointment with [place] for 8/23.    Amber, NT

## 2020-04-10 ENCOUNTER — Inpatient Hospital Stay: Payer: HMO | Admitting: Family Medicine

## 2020-04-10 DIAGNOSIS — H348112 Central retinal vein occlusion, right eye, stable: Secondary | ICD-10-CM | POA: Diagnosis not present

## 2020-04-10 DIAGNOSIS — E113393 Type 2 diabetes mellitus with moderate nonproliferative diabetic retinopathy without macular edema, bilateral: Secondary | ICD-10-CM | POA: Diagnosis not present

## 2020-04-10 DIAGNOSIS — H34812 Central retinal vein occlusion, left eye, with macular edema: Secondary | ICD-10-CM | POA: Diagnosis not present

## 2020-04-10 DIAGNOSIS — H35372 Puckering of macula, left eye: Secondary | ICD-10-CM | POA: Diagnosis not present

## 2020-04-13 ENCOUNTER — Telehealth: Payer: Self-pay | Admitting: Family Medicine

## 2020-04-13 DIAGNOSIS — E1339 Other specified diabetes mellitus with other diabetic ophthalmic complication: Secondary | ICD-10-CM

## 2020-04-13 DIAGNOSIS — E559 Vitamin D deficiency, unspecified: Secondary | ICD-10-CM

## 2020-04-13 DIAGNOSIS — E538 Deficiency of other specified B group vitamins: Secondary | ICD-10-CM

## 2020-04-13 DIAGNOSIS — IMO0002 Reserved for concepts with insufficient information to code with codable children: Secondary | ICD-10-CM

## 2020-04-13 NOTE — Telephone Encounter (Signed)
-----   Message from Aquilla Solian, RT sent at 03/30/2020  2:29 PM EDT ----- Regarding: Lab Orders for Tuesday 8.17.2021 Please place lab orders for Tuesday 8.17.2021, office visit for f/u on Tuesday 8.24.2021 Thank you, Jones Bales RT(R)

## 2020-04-14 ENCOUNTER — Other Ambulatory Visit: Payer: Self-pay

## 2020-04-14 ENCOUNTER — Other Ambulatory Visit (INDEPENDENT_AMBULATORY_CARE_PROVIDER_SITE_OTHER): Payer: HMO

## 2020-04-14 DIAGNOSIS — E1365 Other specified diabetes mellitus with hyperglycemia: Secondary | ICD-10-CM | POA: Diagnosis not present

## 2020-04-14 DIAGNOSIS — IMO0002 Reserved for concepts with insufficient information to code with codable children: Secondary | ICD-10-CM

## 2020-04-14 DIAGNOSIS — E1339 Other specified diabetes mellitus with other diabetic ophthalmic complication: Secondary | ICD-10-CM | POA: Diagnosis not present

## 2020-04-14 LAB — COMPREHENSIVE METABOLIC PANEL
ALT: 19 U/L (ref 0–53)
AST: 16 U/L (ref 0–37)
Albumin: 4 g/dL (ref 3.5–5.2)
Alkaline Phosphatase: 85 U/L (ref 39–117)
BUN: 17 mg/dL (ref 6–23)
CO2: 31 mEq/L (ref 19–32)
Calcium: 9.4 mg/dL (ref 8.4–10.5)
Chloride: 100 mEq/L (ref 96–112)
Creatinine, Ser: 0.97 mg/dL (ref 0.40–1.50)
GFR: 76.42 mL/min (ref >60.00)
Glucose, Bld: 212 mg/dL — ABNORMAL HIGH (ref 70–99)
Potassium: 4 mEq/L (ref 3.5–5.1)
Sodium: 139 mEq/L (ref 135–145)
Total Bilirubin: 0.3 mg/dL (ref 0.2–1.2)
Total Protein: 7.2 g/dL (ref 6.0–8.3)

## 2020-04-14 LAB — LIPID PANEL
Cholesterol: 173 mg/dL (ref 0–200)
HDL: 40.3 mg/dL (ref >39.00)
LDL Cholesterol: 113 mg/dL — ABNORMAL HIGH (ref 0–99)
NonHDL: 132.73
Total CHOL/HDL Ratio: 4
Triglycerides: 99 mg/dL (ref 0.0–149.0)
VLDL: 19.8 mg/dL (ref 0.0–40.0)

## 2020-04-14 LAB — HEMOGLOBIN A1C: Hgb A1c MFr Bld: 11.1 % — ABNORMAL HIGH (ref 4.6–6.5)

## 2020-04-14 NOTE — Progress Notes (Signed)
No critical labs need to be addressed urgently. We will discuss labs in detail at upcoming office visit.   

## 2020-04-19 NOTE — Progress Notes (Deleted)
   Patient ID: [name], male    DOB: Aug 16, 1951, 69 y.o.   MRN: [id number]  HPI  [name] is a 69 yr old male with a history of  Echo report from 04/01/20 reviewed and showed an EF of 60-65% along with mild LVH.  Catheterization done 04/06/16 showed:  Mid RCA to Dist RCA lesion, 100 %stenosed.  Mid LAD-2 lesion, 60 %stenosed.  Mid LAD-1 lesion, 20 %stenosed.  The left ventricular systolic function is normal.  The left ventricular ejection fraction is 50-55% by visual estimate.  LV end diastolic pressure is normal.  2nd [name] lesion, 75 %stenosed.       Chronic total occlusion of the right coronary in the proximal segment well collateralized from the left circumflex and LAD.  Widely patent proximal to mid LAD stent previously placed in July. There is first diagonal diagonal and LAD 30 and 50% narrowing respectively.  Widely patent circumflex unchanged from previous with 70% narrowing in a small branch of the first marginal. Circumflex collateralizes the distal right coronary left ventricular branch.  Inferobasal hypokinesis. EF 50%. EDP is normal.  Admitted 03/31/20 due to acute on chronic HF. Initially given IV lasix with transition to oral diuretics. Discharged after 2 days.   He presents today for his initial visit with a chief complaint of  Review of Systems           Physical Exam  Assessment & Plan:  1: Chronic heart failure with preserved ejection fraction with mild structural changes- - NYHA class - saw cardiology Mariah Milling) 12/24/19 - BNP 03/31/20 was 48.4  2:  HTN- - BP - saw PCP Ermalene Searing) 01/21/20 - BMP 04/14/20 reviewed and showed sodium 139, potassium 4.0, creatinine 0.97 and GFR 76.42  3: DM- - A1c 04/14/20 was 11.1%

## 2020-04-20 ENCOUNTER — Ambulatory Visit: Payer: HMO | Admitting: Family

## 2020-04-21 ENCOUNTER — Encounter: Payer: Self-pay | Admitting: Family Medicine

## 2020-04-21 ENCOUNTER — Telehealth (INDEPENDENT_AMBULATORY_CARE_PROVIDER_SITE_OTHER): Payer: HMO | Admitting: Family Medicine

## 2020-04-21 VITALS — Ht 66.0 in | Wt 291.0 lb

## 2020-04-21 DIAGNOSIS — E785 Hyperlipidemia, unspecified: Secondary | ICD-10-CM

## 2020-04-21 DIAGNOSIS — I5033 Acute on chronic diastolic (congestive) heart failure: Secondary | ICD-10-CM

## 2020-04-21 DIAGNOSIS — E1339 Other specified diabetes mellitus with other diabetic ophthalmic complication: Secondary | ICD-10-CM | POA: Diagnosis not present

## 2020-04-21 DIAGNOSIS — IMO0002 Reserved for concepts with insufficient information to code with codable children: Secondary | ICD-10-CM

## 2020-04-21 DIAGNOSIS — E1365 Other specified diabetes mellitus with hyperglycemia: Secondary | ICD-10-CM

## 2020-04-21 NOTE — Assessment & Plan Note (Signed)
Unable  o tolerate 1/2 tab daily crestor.. will try 1/2 tab three days a week.

## 2020-04-21 NOTE — Assessment & Plan Note (Addendum)
Poor control. Never started back on trulicity, has titrated up 70/30 to 80 units in Am and 40 units PM.. but is dropping low in night , eating sandwich and some Am lows.   Sounds like per pt that he has had daughter complete assistance forms for Trulicity given his poor vision, but left salary blank as he was unsure but together we calculated it is a total of 1650 per month ( social security and fire depatment), 19,800 per year. We will pass this info forward and try to get pt to start Trulicity weekly.

## 2020-04-21 NOTE — Assessment & Plan Note (Signed)
Now resolved.. stable back on BID dose of lasix. And K

## 2020-04-21 NOTE — Progress Notes (Signed)
VIRTUAL VISIT Due to national recommendations of social distancing due to COVID 19, a virtual visit is felt to be most appropriate for this patient at this time.   I connected with the patient on 04/21/20 at  2:40 PM EDT by virtual telehealth platform and verified that I am speaking with the correct person using two identifiers.   Interactive audio and video telecommunications were attempted between this provider and patient, however failed, due to patient having technical difficulties OR patient did not have access to video capability.  We continued and completed visit with audio only.   I discussed the limitations, risks, security and privacy concerns of performing an evaluation and management service by  virtual telehealth platform and the availability of in person appointments. I also discussed with the patient that there may be a patient responsible charge related to this service. The patient expressed understanding and agreed to proceed.  Patient location: Home Provider Location: [place] Participants: Kerby Nora and [name]   Chief Complaint  Patient presents with  . Diabetes    History of Present Illness:  69 year old male presents for follow up DM  was admitted to hospital 8/3/to 8/5.. for acute pulmonary edema. IMproved with lasix 60 IV twice daily.  No further SOB.   Diabetes:  Poor control on insulin, was to restart Trulicity 1.5 mg weekly... he reports  He has not received it yet. Using 80 in AM and 50 in evening in last few days.. CBGs seem to be coming down.  Lab Results  Component Value Date   HGBA1C 11.1 (H) 04/14/2020  Using medications without difficulties: Hypoglycemic episodes: occ 70 Hyperglycemic episodes: yes 421 after Feet problems:no ulcer Blood Sugars averaging: has freestyle meter, fbs usually 70-180 eye exam within last year:  Elevated Cholesterol:  Not as well controlled as previously on zetia alone. Cannot tolerate Crestor even  at 1/2 dose with coQ 10. Lab Results  Component Value Date   CHOL 173 04/14/2020   HDL 40.30 04/14/2020   LDLCALC 113 (H) 04/14/2020   LDLDIRECT 174.0 10/19/2017   TRIG 99.0 04/14/2020   CHOLHDL 4 04/14/2020  Using medications without problems: Muscle aches:  Diet compliance: moderate, improving Exercise: minimal Other complaints:  COVID 19 screen No recent travel or known exposure to COVID19 The patient denies respiratory symptoms of COVID 19 at this time.  The importance of social distancing was discussed today.   ROS    Past Medical History:  Diagnosis Date  . Back injury 02/2002   worker's comp  . CHF (congestive heart failure) (HCC)   . Coronary artery disease, non-occlusive    a. cath 2002 with no sig CAD;  b. cath 2008 normal LM, LAD, LCx, p&dRCA 20-30%, PDA 30%; c.11/2015 NSTEMI/PCI: LM nl, LAD 95p (2.5x15 Xience DES), LCX nl, RCA 100p/m w/ L->R collats, EF 55-65% c. NSTEMI (02/2016) with no culprit leision, switched to Brilinta.  d. NSTEMI 03/2016: again, no culprit lesion and switched back to plavix 2/2 SOB with Brilnta.    . Depression   . Diabetes mellitus type 2, insulin dependent (HCC)   . Hyperlipemia   . Hypertensive heart disease   . Kidney stones   . Morbid obesity (HCC)   . Osteoarthritis   . Snoring     reports that he has never smoked. He has never used smokeless tobacco. He reports that he does not drink alcohol and does not use drugs.   Current Outpatient Medications:  .  aspirin  81 MG EC tablet, Take 1 tablet (81 mg total) by mouth daily., Disp: , Rfl:  .  Blood Glucose Calibration (ONETOUCH VERIO) High SOLN, , Disp: , Rfl:  .  Blood Glucose Monitoring Suppl (ONETOUCH VERIO FLEX SYSTEM) w/Device KIT, , Disp: , Rfl:  .  carvedilol (COREG) 6.25 MG tablet, Take 1 tablet (6.25 mg total) by mouth daily., Disp: 90 tablet, Rfl: 0 .  cholecalciferol (VITAMIN D3) 25 MCG (1000 UNIT) tablet, Take 2,000 Units by mouth daily., Disp: , Rfl:  .  clopidogrel (PLAVIX)  75 MG tablet, TAKE 1 TABLET BY MOUTH ONCE DAILY WITH BREAKFAST, Disp: 90 tablet, Rfl: 1 .  Continuous Blood Gluc Receiver (FREESTYLE LIBRE 14 DAY READER) DEVI, 1 Device by Does not apply route every 14 (fourteen) days., Disp: 1 Device, Rfl: 0 .  Continuous Blood Gluc Sensor (FREESTYLE LIBRE 14 DAY SENSOR) MISC, 1 Device by Does not apply route every 14 (fourteen) days., Disp: 2 each, Rfl: 5 .  ezetimibe (ZETIA) 10 MG tablet, Take 1 tablet (10 mg total) by mouth daily., Disp: 90 tablet, Rfl: 0 .  furosemide (LASIX) 40 MG tablet, Take 1 tablet (40 mg total) by mouth 2 (two) times daily. Can take a 2nd daily dose as needed., Disp: 60 tablet, Rfl: 0 .  insulin NPH-regular Human (NOVOLIN 70/30) (70-30) 100 UNIT/ML injection, Inject 60 units in the morning and 35 units at bedtime., Disp: 50 mL, Rfl: 5 .  isosorbide mononitrate (IMDUR) 30 MG 24 hr tablet, Take 1 tablet (30 mg total) by mouth 2 (two) times daily., Disp: 180 tablet, Rfl: 0 .  Lancets (ONETOUCH DELICA PLUS HCWCBJ62G) MISC, , Disp: , Rfl:  .  losartan (COZAAR) 50 MG tablet, Take 1 tablet (50 mg total) by mouth daily., Disp: 90 tablet, Rfl: 1 .  nitroGLYCERIN (NITROSTAT) 0.4 MG SL tablet, DISSOLVE 1 TABLET UNDER TONGUE AS NEEDEDFOR CHEST PAIN. MAY REPEAT 5 MINUTES APART 3 TIMES IF NEEDED, Disp: 25 tablet, Rfl: 3 .  ONETOUCH VERIO test strip, , Disp: , Rfl:  .  potassium chloride SA (K-DUR) 20 MEQ tablet, Take 1 tablet (20 mEq total) by mouth daily., Disp: 90 tablet, Rfl: 1 .  venlafaxine XR (EFFEXOR-XR) 150 MG 24 hr capsule, TAKE 1 CAPSULE BY MOUTH ONCE DAILY WITH BREAKFAST. TAKE WITH 75 MG DOSE TO EQUAL 225MG  DAILY, Disp: 90 capsule, Rfl: 1 .  venlafaxine XR (EFFEXOR-XR) 75 MG 24 hr capsule, TAKE 1 CAPSULE BY MOUTH ONCE DAILY. TAKEIN ADDITION TO THE 150 MG CAPSULE TO EQUAL A TOTAL DOSE OF 225 MG DAILY, Disp: 90 capsule, Rfl: 1   Observations/Objective: Height 5\' 6"  (1.676 m), weight 291 lb (132 kg).  Physical Exam  Physical  Exam Constitutional:      General: The patient is not in acute distress. Pulmonary:     Effort: Pulmonary effort is normal. No respiratory distress.  Neurological:     Mental Status: The patient is alert and oriented to person, place, and time.  Psychiatric:        Mood and Affect: Mood normal.        Behavior: Behavior normal.   Assessment and Plan    I discussed the assessment and treatment plan with the patient. The patient was provided an opportunity to ask questions and all were answered. The patient agreed with the plan and demonstrated an understanding of the instructions.   The patient was advised to call back or seek an in-person evaluation if the symptoms worsen or if the condition  fails to improve as anticipated.   DM (diabetes mellitus), secondary, uncontrolled, w/eye complications (HCC) Poor control. Never started back on trulicity, has titrated up 70/30 to 80 units in Am and 40 units PM.. but is dropping low in night , eating sandwich and some Am lows.   Sounds like per pt that he has had daughter complete assistance forms for Trulicity given his poor vision, but left salary blank as he was unsure but together we calculated it is a total of 1650 per month ( social security and lawnmowing), 19,800 per year. We will pass this info forward and try to get pt to start Trulicity weekly.   Acute on chronic diastolic (congestive) heart failure ([place]) Now resolved.. stable back on BID dose of lasix. And K  Hyperlipidemia LDL goal <70 Unable  o tolerate 1/2 tab daily crestor.. will try 1/2 tab three days a week.  Total visit time 20 minutes, > 50% spent counseling and cordinating patients care.    [name], MD

## 2020-04-21 NOTE — Patient Instructions (Addendum)
Change insulin 70/30 to 80 units in AM and decreased to 45 Units at night to avoid lows.  Start Trulcity back once we get it set up.  Start Crestor 1/2 tab 3 days a week.  Call to set up follow up appt in 3 months with labs prior.

## 2020-04-22 ENCOUNTER — Other Ambulatory Visit: Payer: Self-pay | Admitting: Cardiovascular Disease

## 2020-04-23 ENCOUNTER — Ambulatory Visit: Payer: Self-pay

## 2020-05-06 ENCOUNTER — Other Ambulatory Visit: Payer: Self-pay

## 2020-05-06 NOTE — Patient Outreach (Addendum)
[facility]) Care Management Chronic Special Needs Program  05/06/2020  Name: HAZAIAH EDGECOMBE DOB: 06/22/1951  MRN: [id number]  Mr. Elijan Googe is enrolled in a chronic special needs plan for Diabetes. RNCM called client to follow up and updated care plan.  Subjective: Client reports he has followed up with provider visits. Last visit with primary care 04/21/20 (tele-health). Client reports an A1C of 11 and acknowledges this to be high. Client states he is unable to afford medication Trulicity. He seems somewhat confused about his current insulin dosage and states he is currently taking Novolin 70/30 70 units in the morning and  40 units in the evening. However provider note is different (80 units in the morning and 45 units in the evening) and Medication list is different(60 units in the morning and 35 units at bedtime.. Client states he is using freestyle libre 3-4 times/day. Blood sugar this morning was 197. However, client reports he is changing lifestyle habits and has improved his diet.   History of heart failure. Last hospitalization due to heart failure 03/31/20-04/02/20. Client reports he is feeling a lot better. He reports he is weighing self daily. However, client did not attend his post hospital visit with the heart failure clinic.   Goals Addressed              This Visit's Progress   .   Acknowledge receipt of Advanced Directive package (pt-stated)   On track     Discussed advance care planning. Client request another advanced directive packet.  Emmi education resent: "Advance directives". Please review and call if you have any questions.  RN provided Advance Directive packet via mail to client per request.     .  Client verbalize knowledge of Heart Failure disease self management within the next 6 months.   On track     Please call and schedule visit with heart failure clinic at [facility], if you do not already have one scheduled [phone]. Per your request, your  RN care coordinator called and left message requesting them to reschedule your appointment.  Reports weighs self every day. It is important to weigh daily and write down weights.  Reviewed Signs and symptoms of heart failure exacerbation. Client voiced understanding. Continue to take medications as prescribed by your provider. Discussed the importance of monitoring intake of fluid.        .  COMPLETED: Client will report no worsening of symptoms related to heart disease within the next 6 months        Reports no worsening of heart disease.    .  COMPLETED: Client will report weighing and recording weights daily within 6 months. (pt-stated)        Client reports he has been weighing self every day.    .  COMPLETED: General - Client will not be readmitted within 30 days (C-SNP)- discharge date 04/02/2020        No readmissions.     Marland Kitchen  HEMOGLOBIN A1C < 7        Hemoglobin A1C 11.1  Have your A1C checked every 6 months if you are at goal or every 3 months if you are not at goal. Ask your provider what is your target A1C goal? Ask your provider what is your target blood sugar range? Plan to eat low carbohydrate and low salt meals, watch portion sizes and avoid sugar sweetened drinks. Provided Living Well with Diabetes book. Please review and plan to discuss at next telephone assessment. Increase  your exercise/activity if you are able to . Follow doctor recommendations.     Marland Kitchen  LIFESTYLE - DECREASE FALLS RISK   On track     Discussed no falls, client reports sometimes will stumble. Reinforced the importance of maintaining muscle tone and strength. Reiterated possibility of physical therapy for balance issues. Discuss with your provider if you .  Continue to change positions slowly Reports daughter obtained tub bench for daughter. Inside your home: don't use throw rugs, use plenty of lighting, keep walkways clear of clutter, and remove tripping hazards such as cords.  Consider installing  grab bars in the hallway and grab bars in shower or tub. [employer] [facility] [place] can provide more information at [phone]. Continue to be as active and maintain muscle tone-walking.     .  Maintain timely refills of diabetic medication as prescribed within the year .   On track     Medications reviewed with client. It is important to take your medications as prescribed. Please continue to communicate with Clinical Pharmacist at [facility] as they are there to also help you achieve your goals. Pharmacy referral for medication assistance (Trulicity). Pharmacy referral for medication reconciliation regarding insulin dosage.       .  COMPLETED: Obtain annual screen for micro albuminuria (urine) , nephropathy (kidney problems)        Discussed with client, who reports done.  Diabetes can affect your kidneys. It is important for your doctor to check your urine at least once a year. These test show how your kidneys are working.     .  Obtain Hemoglobin A1C at least 2 times per year   On track     A1C 11.1 on 04/14/20     .  Visit Primary Care Provider or Endocrinologist at least 2 times per year    On track     Please call and schedule visit with heart failure clinic at [facility], if you do not already have one scheduled [phone] Per your request, your RN care coordinator has called and left message requesting them to reschedule your appointment.   Latest visit with primary care provider completed (tele-health) on 04/21/20; 01/21/20 and 10/15/19.       RNCM called and left message with Heart failure clinic([place]) requesting to reschedule client's appointment. RNCM called primary care provider office for clarification of client's insulin dosage. Pharmacy referral completed for medication assistance. Also pharmacy referral for medication reconciliation regarding insulin dosage. RNCM transferred client to health care concierge regarding a benefit  question.  Plan: send updated care plan to client; send updated care plan to primary care. RNCM will continue to follow.     Kathyrn Sheriff, RN, MSN, [facility] Chronic Care Management Coordinator [facility] [phone]

## 2020-05-07 ENCOUNTER — Other Ambulatory Visit: Payer: Self-pay | Admitting: Cardiovascular Disease

## 2020-05-07 ENCOUNTER — Other Ambulatory Visit: Payer: Self-pay

## 2020-05-07 NOTE — Patient Outreach (Signed)
  [facility]) Care Management Chronic Special Needs Program  05/07/2020  Name: [name] DOB: 10-26-1950  MRN: [id number]  [name] is enrolled in a chronic special needs plan for Diabetes.    RNCM called to follow up. Client reports he has not received a call from the Heart failure clinic. RNCM provided contact number for heart failure clinic([place]) to client and client to call to schedule an appointment.  RNCM also reinforced with client that Pharmacist  Referral made for medication assistance and medication reconciliation regarding insulin dosage.    Plan: RNCM will continue to follow.    Kathyrn Sheriff, RN, MSN, [facility] Chronic Care Management Coordinator [facility] [phone]

## 2020-05-08 ENCOUNTER — Telehealth: Payer: Self-pay

## 2020-05-08 ENCOUNTER — Ambulatory Visit: Payer: HMO

## 2020-05-08 ENCOUNTER — Other Ambulatory Visit: Payer: Self-pay

## 2020-05-08 DIAGNOSIS — IMO0002 Reserved for concepts with insufficient information to code with codable children: Secondary | ICD-10-CM

## 2020-05-08 DIAGNOSIS — E1339 Other specified diabetes mellitus with other diabetic ophthalmic complication: Secondary | ICD-10-CM

## 2020-05-08 NOTE — Chronic Care Management (AMB) (Signed)
Chronic Care Management Pharmacy  Name: [name]  MRN: [id number] DOB: March 01, 1951  Chief Complaint/ HPI  [name],  69 y.o., male presents for their Follow-Up CCM visit with the clinical pharmacist via telephone.  PCP : William Sanders, MD  Their chronic conditions addressed today include: depression, diabetes, hypertension, CAD, hyperlipidemia   Acute CCM follow-up call 05/08/2020 concerning recent elevated blood sugars and out of Trulicity.   Office Visits:  04/21/2020 - Patient having morning low blood sugars with Insulin 70/30 80 units in the morning and 40 units in the evening. Daughter is helping him complete Trulicity PAP form. Try Crestor 1/2 tablet three times weekly. Resume Trulicity 1.5 mg once arrives.   01/21/20: PCP visit - DM on insulin 70/30 70 units AM, 40-50 units at night, denies taking Trulicity, uses libre on and off, A1c 39%, restart Trulicity at 1.5 mg daily; pt stopped Crestor due to arm and should pain, try 1/2 tablet daily with CoQ10, HTN controlled   11/27/19: CCM visit - assist with adherence/bubble packs through [place], increase Trulicity to 1.5 (pt has not yet increased)  10/15/19: PCP visit - out of Trulicity for 1 month, increase Trulicity to 1.5 mg weekly, restart Crestor, (off antifungal), purchase BP cuff and monitor at home  Consult Visit:   05/06/2020 [facility] Nurse visit - Patient having affordability issues with Trulicity. Discrepancy on med list. Did not attend heart failure clinic appointment post-hospital.   04/03/2020 [facility] Nurse - Patient was hospitalized for heart failure 08/03-08/05.   12/24/19: Cardiology - 12 month follow up, back on statin, thinks he is retaining fluid, takes Lasix 40 mg daily and extra tablet PRN. GERD, stable on PPI, HTN controlled, no med changes, RTC 6 months   Allergies  Allergen Reactions  . Atorvastatin Other (See Comments)    Body aches   Medications: Outpatient Encounter Medications as  of 05/08/2020  Medication Sig Note  . aspirin 81 MG EC tablet Take 1 tablet (81 mg total) by mouth daily.   . Blood Glucose Calibration (ONETOUCH VERIO) High SOLN    . Blood Glucose Monitoring Suppl (ONETOUCH VERIO FLEX SYSTEM) w/Device KIT    . carvedilol (COREG) 6.25 MG tablet Take 1 tablet (6.25 mg total) by mouth daily.   . cholecalciferol (VITAMIN D3) 25 MCG (1000 UNIT) tablet Take 2,000 Units by mouth daily.   . clopidogrel (PLAVIX) 75 MG tablet TAKE 1 TABLET BY MOUTH ONCE DAILY WITH BREAKFAST   . Continuous Blood Gluc Receiver (FREESTYLE LIBRE 14 DAY READER) DEVI 1 Device by Does not apply route every 14 (fourteen) days.   . Continuous Blood Gluc Sensor (FREESTYLE LIBRE 14 DAY SENSOR) MISC 1 Device by Does not apply route every 14 (fourteen) days.   Marland Kitchen ezetimibe (ZETIA) 10 MG tablet TAKE 1 TABLET BY MOUTH ONCE DAILY   . furosemide (LASIX) 40 MG tablet Take 1 tablet (40 mg total) by mouth 2 (two) times daily. Can take a 2nd daily dose as needed.   . insulin NPH-regular Human (NOVOLIN 70/30) (70-30) 100 UNIT/ML injection Inject 60 units in the morning and 35 units at bedtime. 05/06/2020: Client reports he is taking 70 units in the morning and 40 units in the evening.  . isosorbide mononitrate (IMDUR) 30 MG 24 hr tablet TAKE 1 TABLET BY MOUTH TWICE DAILY   . Lancets (ONETOUCH DELICA PLUS [id number]) MISC    . losartan (COZAAR) 50 MG tablet Take 1 tablet (50 mg total) by mouth  daily.   . nitroGLYCERIN (NITROSTAT) 0.4 MG SL tablet DISSOLVE 1 TABLET UNDER TONGUE AS NEEDEDFOR CHEST PAIN. MAY REPEAT 5 MINUTES APART 3 TIMES IF NEEDED   . ONETOUCH VERIO test strip    . potassium chloride SA (K-DUR) 20 MEQ tablet Take 1 tablet (20 mEq total) by mouth daily.   Marland Kitchen venlafaxine XR (EFFEXOR-XR) 150 MG 24 hr capsule TAKE 1 CAPSULE BY MOUTH ONCE DAILY WITH BREAKFAST. TAKE WITH 75 MG DOSE TO EQUAL 225MG DAILY   . venlafaxine XR (EFFEXOR-XR) 75 MG 24 hr capsule TAKE 1 CAPSULE BY MOUTH ONCE DAILY. TAKEIN ADDITION TO  THE 150 MG CAPSULE TO EQUAL A TOTAL DOSE OF 225 MG DAILY    No facility-administered encounter medications on file as of 05/08/2020.   Current Diagnosis/Assessment: Goals    .  Pharmacy Care Plan      CARE PLAN ENTRY (see longitudinal plan of care for additional care plan information)  Current Barriers:  . Chronic Disease Management support, education, and care coordination needs related to Hyperlipidemia, Diabetes, and Depression  Hyperlipidemia Lab Results  Component Value Date/Time   LDLCALC 92 01/07/2020 07:40 AM   LDLDIRECT 174.0 10/19/2017 04:00 PM .  Pharmacist Clinical Goal(s): o Over the next 3 months, patient will work with PharmD and providers to achieve LDL goal < 70 . Current regimen:  o Ezetimibe 10 mg - 1 tablet daily  . Interventions: o Retry rosuvastatin 40 mg - 1 tablet every other day . Patient self care activities - Over the next 3 months, patient will: o Resume rosuvastatin 40 mg - 1 tablet every other day. o Call if you have any joint pain.   Diabetes Lab Results  Component Value Date/Time   HGBA1C 10.0 (H) 01/07/2020 07:40 AM   HGBA1C 10.1 (A) 10/15/2019 03:04 PM   HGBA1C 10.5 (H) 06/21/2019 07:57 AM   HGBA1C 9.6 10/02/2018 12:00 AM .  Pharmacist Clinical Goal(s): o Over the next 3 months, patient will work with PharmD and providers to achieve A1c goal <7% . Current regimen:  o Novolin 70/30 - Inject 70 units in the morning and 40 units at evening meal . Interventions: o Recommend taking insulin 30-45 minutes before breakfast and supper to avoid low blood glucose . Patient self care activities - Over the next 3 months, patient will: o Check blood sugar daily with Freestyle Libre continuous glucose monitor o Contact provider with any episodes of hypoglycemia o Bring patient assistance forms to office for Trulicity o Begin taking your insulin 30 minutes before you eat instead of after meals o Try limiting your carbohydrates at each meal to 45-60 grams  and 15-30 grams for snacks  Depression . Pharmacist Clinical Goal(s) o Over the next 3 months, patient will work with PharmD and providers to improve depressed mood . Current regimen:  o Venlafaxine XR 75 mg and 150 mg - take total dose of 225 mg once daily . Interventions: o Recommend setting a bedtime routine and sleeping between the hours of 10 PM and 8 AM.  . Patient self care activities - Over the next 3 months, patient will: o Continue to mow 2-3 lawns per week. o Consider attending your church again. o Walk to your mailbox daily. o Try to avoid daytime naps and move bedtime up to before midnight. Avoid TV after 11 PM. Avoid caffeine after lunch.   Please see past updates related to this goal by clicking on the "Past Updates" button in the selected goal  Diabetes  Recent Relevant Labs: Lab Results  Component Value Date/Time   HGBA1C 11.1 (H) 04/14/2020 08:34 AM   HGBA1C 11.0 (H) 03/31/2020 09:49 PM   HGBA1C 9.6 10/02/2018 12:00 AM   MICROALBUR <0.7 02/21/2017 01:57 PM   MICROALBUR 0.8 02/16/2016 08:24 AM     Patient is currently uncontrolled on the following medications:   Novolin 70/30 - Inject 80 units in the morning and 50 units at evening meal   Update 05/08/2020 - Patient's blood sugar has been low in the morning and high in the evening per patient report. Patient is very forgetful per daughters report. He is using 80 units in the morning and 50 units in the evening. He states sometimes when it is really high he will take an extra shot. Patient reports that his diet is no the best and drives up his blood sugar as well. Patient doesn't feel like his insulin regimen works well right now but it is more affordable and he is in the doughnut hole. Encouraged patient that Trulicity is very important for his blood sugar numbers to improve. Patient acknowledges understanding.   Still not taking Trulicity - needs to bring in patient assistance forms for signing,  reports he will bring it this afternoon 05/08/2020 or Monday 05/11/2020. Was supposed to bring back in July but patient states he forget things easily and doesn't always feel like driving this far. Spoke to Daughter as well and provided her the number for [employer] to determine if patient is eligible for any extra assistance.    Diet: Patient reports that he does not eat the way he should. His libre often reads "high" because can't register number. He eats ice cream but says he doesn't plan to buy any more. Today he went to get a biscuit at [facility] for breakfast. Patient does indicate that he can keep his sugar better when he eats better.   Blood sugar readings: Reports home readings of 80-120 mg/dL Occasional readings in the 200s when eats ice cream. Patient reports evening readings 250-350 mg/dL.   Plan: Bring in patient assistance forms. Resume Trulicity once available. Contact [employer] to see if eligible for any form of extra help with medications. Continue to wear freestyle libre daily.    CCM Follow Up: June 08, 2020 at 11 AM (telephone visit)  Sherre Poot, PharmD, [facility] Clinical Pharmacist [facility] [phone] (office) [phone] (mobile)

## 2020-05-08 NOTE — Telephone Encounter (Signed)
Mr. Farve notified as instructed by telephone.  Patient states he blood sugar has come way down. He checked his blood while on the phone and it was 270 mg/dl.  Dr. Ermalene Searing notified.  Per Dr. Ermalene Searing, patient was notified to continue his insulin 80 units in the morning and 50 units as night.  Drink plenty of water and work on eating low carb foods.  ER precautions given.  Patient states understanding.  I reminded him that I still need his income information so I can send in his application for his Trulicity.  He states he has it and will bring it to me Monday morning.

## 2020-05-08 NOTE — Telephone Encounter (Addendum)
If pt refuses to go to ER.  He can go ahead and take his PM dose of 70/30 now but increase it to 50 units. Increase water intake. Low carb foods.  If sugar not coming down in next 2 hours or if abd pain, vomiting fever chest pain or shortness of breath.. go to ER.  He needs to restart Trulicity as soon as he can.

## 2020-05-08 NOTE — Telephone Encounter (Signed)
[facility] Day - Client TELEPHONE ADVICE RECORD AccessNurse Patient Name: [name] Gender: Male DOB: 10-01-1950 Age: 69 Y 5 M 29 D Return Phone Number: [phone] (Primary), [phone] (Secondary) Address[name] [address] Client [facility] Day - Client Client Site [facility] - Day Physician Kerby Nora - MD Contact Type Call Who Is Calling Patient / Member / Family / Caregiver Call Type Triage / Clinical Relationship To Patient Self Return Phone Number [phone] (Primary) Chief Complaint BLOOD SUGAR HIGH - 500 or greater Reason for Call Symptomatic / Request for Health Information Initial Comment Caller states he has high sugar. - too high to register on the machine Translation No Nurse Assessment Nurse: Thurmond Butts, RN, Meriam Sprague Date/Time (Eastern Time): 05/08/2020 12:27:12 PM Confirm and document reason for call. If symptomatic, describe symptoms. ---Caller states his blood sugar was 257 this am. It is now too high to read. No symptoms. just feels tired. He took 80 units 70/30 this am. Has the patient had close contact with a person known or suspected to have the novel coronavirus illness OR traveled / lives in area with major community spread (including international travel) in the last 14 days from the onset of symptoms? * If Asymptomatic, screen for exposure and travel within the last 14 days. ---No Does the patient have any new or worsening symptoms? ---Yes Will a triage be completed? ---Yes Related visit to physician within the last 2 weeks? ---No Does the PT have any chronic conditions? (i.e. diabetes, asthma, this includes High risk factors for pregnancy, etc.) ---Yes List chronic conditions. ---diabetes, HTN , heart attack 6 years ago. Is this a behavioral health or substance abuse call? ---No Guidelines Guideline Title Affirmed Question Affirmed Notes Nurse Date/Time  (Eastern Time) Diabetes - High Blood Sugar Blood glucose > 500 mg/ dL (96.2 mmol/L) Thurmond Butts, RN, [facility] 05/08/2020 12:29:05 PM Disp. Time [name] Time) Disposition Final User 05/08/2020 12:22:06 PM Send to Urgent Queue Richrd Humbles 05/08/2020 12:31:16 PM Go to ED Now (or PCP triage) Yes Thurmond Butts, RN, Meriam Sprague PLEASE NOTE: All timestamps contained within this report are represented as [place] Standard Time. CONFIDENTIALTY NOTICE: This fax transmission is intended only for the addressee. It contains information that is legally privileged, confidential or otherwise protected from use or disclosure. If you are not the intended recipient, you are strictly prohibited from reviewing, disclosing, copying using or disseminating any of this information or taking any action in reliance on or regarding this information. If you have received this fax in error, please notify us immediately by telephone so that we can arrange for its return to [place]. Phone: [phone], Toll-Free: [phone], Fax: [phone] Page: 2 of 2 Call Id: [id number] Caller Disagree/Comply Disagree Caller Understands Yes PreDisposition Home Care Care Advice Given Per Guideline GO TO ED NOW (OR PCP TRIAGE): * IF NO PCP (PRIMARY CARE PROVIDER) SECOND-LEVEL TRIAGE: You need to be seen within the next hour. Go to the ED/UCC at _____________ Hospital. Leave as soon as you can. * It is better and safer if another adult drives instead of you. CARE ADVICE given per Diabetes - High Blood Sugar (Adult) guideline. Comments User: Bradly Chris, RN Date/Time [name] Time): 05/08/2020 12:36:04 PM Pt says he just wants his dr to know his blood sugar is still high and he is not going to ER. Referrals GO TO FACILITY REFUSED

## 2020-05-23 ENCOUNTER — Other Ambulatory Visit: Payer: Self-pay

## 2020-05-23 ENCOUNTER — Ambulatory Visit (INDEPENDENT_AMBULATORY_CARE_PROVIDER_SITE_OTHER): Payer: HMO

## 2020-05-23 DIAGNOSIS — Z23 Encounter for immunization: Secondary | ICD-10-CM

## 2020-06-01 NOTE — Chronic Care Management (AMB) (Deleted)
Chronic Care Management Pharmacy  Name: [name]  MRN: [id number] DOB: 1950-10-30  Chief Complaint/ HPI  [name],  69 y.o., male presents for their Follow-Up CCM visit with the clinical pharmacist via telephone.  PCP : Jinny Sanders, MD  Their chronic conditions addressed today include: depression, diabetes, hypertension, CAD, hyperlipidemia   ***  Office Visits:  05/23/20: flu shot.   04/21/20 : Patient having morning low blood sugars with Insulin 70/30 80 units in the morning and 40 units in the evening. Daughter is helping him complete Trulicity PAP form. Try Crestor 1/2 tablet three times weekly. Resume Trulicity 1.5 mg once arrives.   01/21/20: PCP visit - DM on insulin 70/30 70 units AM, 40-50 units at night, denies taking Trulicity, uses libre on and off, A1c 67%, restart Trulicity at 1.5 mg daily; pt stopped Crestor due to arm and should pain, try 1/2 tablet daily with CoQ10, HTN controlled   11/27/19: CCM visit - assist with adherence/bubble packs through [place], increase Trulicity to 1.5 (pt has not yet increased)  10/15/19: PCP visit - out of Trulicity for 1 month, increase Trulicity to 1.5 mg weekly, restart Crestor, (off antifungal), purchase BP cuff and monitor at home  Consult Visit:   05/06/2020 [facility] Nurse visit - Patient having affordability issues with Trulicity. Discrepancy on med list. Did not attend heart failure clinic appointment post-hospital.   04/03/2020 [facility] Nurse - Patient was hospitalized for heart failure 08/03-08/05.   12/24/19: Cardiology - 12 month follow up, back on statin, thinks he is retaining fluid, takes Lasix 40 mg daily and extra tablet PRN. GERD, stable on PPI, HTN controlled, no med changes, RTC 6 months   Allergies  Allergen Reactions  . Atorvastatin Other (See Comments)    Body aches   Medications: Outpatient Encounter Medications as of 06/08/2020  Medication Sig Note  . aspirin 81 MG EC tablet Take 1  tablet (81 mg total) by mouth daily.   . Blood Glucose Calibration (ONETOUCH VERIO) High SOLN    . Blood Glucose Monitoring Suppl (ONETOUCH VERIO FLEX SYSTEM) w/Device KIT    . carvedilol (COREG) 6.25 MG tablet Take 1 tablet (6.25 mg total) by mouth daily.   . cholecalciferol (VITAMIN D3) 25 MCG (1000 UNIT) tablet Take 2,000 Units by mouth daily.   . clopidogrel (PLAVIX) 75 MG tablet TAKE 1 TABLET BY MOUTH ONCE DAILY WITH BREAKFAST   . Continuous Blood Gluc Receiver (FREESTYLE LIBRE 14 DAY READER) DEVI 1 Device by Does not apply route every 14 (fourteen) days.   . Continuous Blood Gluc Sensor (FREESTYLE LIBRE 14 DAY SENSOR) MISC 1 Device by Does not apply route every 14 (fourteen) days.   Marland Kitchen ezetimibe (ZETIA) 10 MG tablet TAKE 1 TABLET BY MOUTH ONCE DAILY   . furosemide (LASIX) 40 MG tablet Take 1 tablet (40 mg total) by mouth 2 (two) times daily. Can take a 2nd daily dose as needed.   . insulin NPH-regular Human (NOVOLIN 70/30) (70-30) 100 UNIT/ML injection Inject 60 units in the morning and 35 units at bedtime. (Patient taking differently: Per patient report: he is Injecting 80 units in the morning and 50 units at bedtime.) 05/06/2020: Client reports he is taking 70 units in the morning and 40 units in the evening.  . isosorbide mononitrate (IMDUR) 30 MG 24 hr tablet TAKE 1 TABLET BY MOUTH TWICE DAILY   . Lancets (ONETOUCH DELICA PLUS [id number]) MISC    . losartan (COZAAR) 50  MG tablet Take 1 tablet (50 mg total) by mouth daily.   . nitroGLYCERIN (NITROSTAT) 0.4 MG SL tablet DISSOLVE 1 TABLET UNDER TONGUE AS NEEDEDFOR CHEST PAIN. MAY REPEAT 5 MINUTES APART 3 TIMES IF NEEDED   . ONETOUCH VERIO test strip    . potassium chloride SA (K-DUR) 20 MEQ tablet Take 1 tablet (20 mEq total) by mouth daily.   Marland Kitchen venlafaxine XR (EFFEXOR-XR) 150 MG 24 hr capsule TAKE 1 CAPSULE BY MOUTH ONCE DAILY WITH BREAKFAST. TAKE WITH 75 MG DOSE TO EQUAL 225MG DAILY   . venlafaxine XR (EFFEXOR-XR) 75 MG 24 hr capsule TAKE 1  CAPSULE BY MOUTH ONCE DAILY. TAKEIN ADDITION TO THE 150 MG CAPSULE TO EQUAL A TOTAL DOSE OF 225 MG DAILY    No facility-administered encounter medications on file as of 06/08/2020.   Current Diagnosis/Assessment: Goals    .  Pharmacy Care Plan      CARE PLAN ENTRY (see longitudinal plan of care for additional care plan information)  Current Barriers:  . Chronic Disease Management support, education, and care coordination needs related to Hyperlipidemia, Diabetes, and Depression  Hyperlipidemia Lab Results  Component Value Date/Time   LDLCALC 92 01/07/2020 07:40 AM   LDLDIRECT 174.0 10/19/2017 04:00 PM .  Pharmacist Clinical Goal(s): o Over the next 3 months, patient will work with PharmD and providers to achieve LDL goal < 70 . Current regimen:  o Ezetimibe 10 mg - 1 tablet daily  . Interventions: o Retry rosuvastatin 40 mg - 1 tablet every other day . Patient self care activities - Over the next 3 months, patient will: o Resume rosuvastatin 40 mg - 1 tablet every other day. o Call if you have any joint pain.   Diabetes Lab Results  Component Value Date/Time   HGBA1C 10.0 (H) 01/07/2020 07:40 AM   HGBA1C 10.1 (A) 10/15/2019 03:04 PM   HGBA1C 10.5 (H) 06/21/2019 07:57 AM   HGBA1C 9.6 10/02/2018 12:00 AM .  Pharmacist Clinical Goal(s): o Over the next 3 months, patient will work with PharmD and providers to achieve A1c goal <7% . Current regimen:  o Novolin 70/30 - Inject 70 units in the morning and 40 units at evening meal . Interventions: o Recommend taking insulin 30-45 minutes before breakfast and supper to avoid low blood glucose . Patient self care activities - Over the next 3 months, patient will: o Check blood sugar daily with Freestyle Libre continuous glucose monitor o Contact provider with any episodes of hypoglycemia o Bring patient assistance forms to office for Trulicity o Begin taking your insulin 30 minutes before you eat instead of after meals o Try  limiting your carbohydrates at each meal to 45-60 grams and 15-30 grams for snacks  Depression . Pharmacist Clinical Goal(s) o Over the next 3 months, patient will work with PharmD and providers to improve depressed mood . Current regimen:  o Venlafaxine XR 75 mg and 150 mg - take total dose of 225 mg once daily . Interventions: o Recommend setting a bedtime routine and sleeping between the hours of 10 PM and 8 AM.  . Patient self care activities - Over the next 3 months, patient will: o Continue to mow 2-3 lawns per week. o Consider attending your church again. o Walk to your mailbox daily. o Try to avoid daytime naps and move bedtime up to before midnight. Avoid TV after 11 PM. Avoid caffeine after lunch.   Please see past updates related to this goal by clicking on  the "Past Updates" button in the selected goal             Diabetes  Recent Relevant Labs: Lab Results  Component Value Date/Time   HGBA1C 11.1 (H) 04/14/2020 08:34 AM   HGBA1C 11.0 (H) 03/31/2020 09:49 PM   HGBA1C 9.6 10/02/2018 12:00 AM   MICROALBUR <0.7 02/21/2017 01:57 PM   MICROALBUR 0.8 02/16/2016 08:24 AM     Patient is currently uncontrolled on the following medications:   Novolin 70/30 - Inject 80 units in the morning and 50 units at evening meal   Update 05/08/2020 - Patient's blood sugar has been low in the morning and high in the evening per patient report. Patient is very forgetful per daughters report. He is using 80 units in the morning and 50 units in the evening. He states sometimes when it is really high he will take an extra shot. Patient reports that his diet is no the best and drives up his blood sugar as well. Patient doesn't feel like his insulin regimen works well right now but it is more affordable and he is in the doughnut hole. Encouraged patient that Trulicity is very important for his blood sugar numbers to improve. Patient acknowledges understanding.   Still not taking Trulicity -  needs to bring in patient assistance forms for signing, reports he will bring it this afternoon 05/08/2020 or Monday 05/11/2020. Was supposed to bring back in July but patient states he forget things easily and doesn't always feel like driving this far. Spoke to Daughter as well and provided her the number for [place] [employer] to determine if patient is eligible for any extra assistance.    Diet: Patient reports that he does not eat the way he should. His libre often reads "high" because can't register number. He eats ice cream but says he doesn't plan to buy any more. Today he went to get a biscuit at [facility] for breakfast. Patient does indicate that he can keep his sugar better when he eats better.   Blood sugar readings: Reports home readings of 80-120 mg/dL Occasional readings in the 200s when eats ice cream. Patient reports evening readings 250-350 mg/dL.   Plan: Bring in patient assistance forms. Resume Trulicity once available. Contact [employer] to see if eligible for any form of extra help with medications. Continue to wear freestyle libre daily.    CCM Follow Up: June 08, 2020 at 11 AM (telephone visit)  Sherre Poot, PharmD, [facility] Clinical Pharmacist [facility] [phone] (office) [phone] (mobile)

## 2020-06-02 DIAGNOSIS — E113393 Type 2 diabetes mellitus with moderate nonproliferative diabetic retinopathy without macular edema, bilateral: Secondary | ICD-10-CM | POA: Diagnosis not present

## 2020-06-02 DIAGNOSIS — H35372 Puckering of macula, left eye: Secondary | ICD-10-CM | POA: Diagnosis not present

## 2020-06-02 DIAGNOSIS — H34812 Central retinal vein occlusion, left eye, with macular edema: Secondary | ICD-10-CM | POA: Diagnosis not present

## 2020-06-02 DIAGNOSIS — H348112 Central retinal vein occlusion, right eye, stable: Secondary | ICD-10-CM | POA: Diagnosis not present

## 2020-06-05 ENCOUNTER — Other Ambulatory Visit: Payer: Self-pay | Admitting: Family Medicine

## 2020-06-08 ENCOUNTER — Telehealth: Payer: HMO

## 2020-06-12 ENCOUNTER — Other Ambulatory Visit: Payer: Self-pay

## 2020-06-12 ENCOUNTER — Ambulatory Visit: Payer: HMO

## 2020-06-12 DIAGNOSIS — E782 Mixed hyperlipidemia: Secondary | ICD-10-CM

## 2020-06-12 DIAGNOSIS — IMO0002 Reserved for concepts with insufficient information to code with codable children: Secondary | ICD-10-CM

## 2020-06-12 DIAGNOSIS — E1339 Other specified diabetes mellitus with other diabetic ophthalmic complication: Secondary | ICD-10-CM

## 2020-06-12 NOTE — Patient Instructions (Addendum)
Visit Information  Goals Addressed            This Visit's Progress   . Pharmacy Care Plan       CARE PLAN ENTRY (see longitudinal plan of care for additional care plan information)  Current Barriers:  . Chronic Disease Management support, education, and care coordination needs related to Hyperlipidemia, Diabetes, and Depression  Hyperlipidemia Lab Results  Component Value Date/Time   LDLCALC 92 01/07/2020 07:40 AM   LDLDIRECT 174.0 10/19/2017 04:00 PM .  Pharmacist Clinical Goal(s): o Over the next 3 months, patient will work with PharmD and providers to achieve LDL goal < 70 . Current regimen:  o Ezetimibe 10 mg - 1 tablet daily  o Rosuvastatin 40 mg -1 tablet three times weekly  . Interventions: o Continue rosuvastatin 40 mg - 1 tablet three times weekly.  . Patient self care activities - Over the next 3 months, patient will: o Continue rosuvastatin 40 mg - 1 tablet three times weekly. o Call if you have any joint pain.   Diabetes Lab Results  Component Value Date/Time   HGBA1C 10.0 (H) 01/07/2020 07:40 AM   HGBA1C 10.1 (A) 10/15/2019 03:04 PM   HGBA1C 10.5 (H) 06/21/2019 07:57 AM   HGBA1C 9.6 10/02/2018 12:00 AM .  Pharmacist Clinical Goal(s): o Over the next 3 months, patient will work with PharmD and providers to achieve A1c goal <7% . Current regimen:  o Novolin 70/30 - Inject 70 units in the morning and 50 units at evening meal . Interventions: o Recommend taking insulin 30-45 minutes before breakfast and supper to avoid low blood glucose o Recommend patient resume Trulicity as soon as possible.  o Requested patient to bring financial documentation to the office for patient assistance application for Trulicity. Patient acknolwedges and states he will bring on Monday 06/15/2020 to [facility].  . Patient self care activities - Over the next 3 months, patient will: o Check blood sugar daily with Freestyle Libre continuous glucose monitor o Contact provider with  any episodes of hypoglycemia o Bring patient assistance forms to office for Trulicity o Begin taking your insulin 30 minutes before you eat instead of after meals o Try limiting your carbohydrates at each meal to 45-60 grams and 15-30 grams for snacks  Depression . Pharmacist Clinical Goal(s) o Over the next 3 months, patient will work with PharmD and providers to improve depressed mood . Current regimen:  o Venlafaxine XR 75 mg and 150 mg - take total dose of 225 mg once daily . Interventions: o Recommend setting a bedtime routine and sleeping between the hours of 10 PM and 8 AM.  . Patient self care activities - Over the next 3 months, patient will: o Continue to mow 2-3 lawns per week. o Consider attending your church again. o Walk to your mailbox daily. o Try to avoid daytime naps and move bedtime up to before midnight. Avoid TV after 11 PM. Avoid caffeine after lunch.   Please see past updates related to this goal by clicking on the "Past Updates" button in the selected goal         The patient verbalized understanding of instructions provided today and declined a print copy of patient instruction materials.   Telephone follow up appointment with pharmacy team member scheduled for: 09/02/2020  Juliane Lack, PharmD, [facility] Clinical Pharmacist [facility] [phone] (office) [phone] (mobile)   Blood Glucose Monitoring, Adult Monitoring your blood sugar (glucose) is an important part of  managing your diabetes (diabetes mellitus). Blood glucose monitoring involves checking your blood glucose as often as directed and keeping a record (log) of your results over time. Checking your blood glucose regularly and keeping a blood glucose log can:  Help you and your health care provider adjust your diabetes management plan as needed, including your medicines or insulin.  Help you understand how food, exercise, illnesses, and medicines affect your blood  glucose.  Let you know what your blood glucose is at any time. You can quickly find out if you have low blood glucose (hypoglycemia) or high blood glucose (hyperglycemia). Your health care provider will set individualized treatment goals for you. Your goals will be based on your age, other medical conditions you have, and how you respond to diabetes treatment. Generally, the goal of treatment is to maintain the following blood glucose levels:  Before meals (preprandial): 80-130 mg/dL (1.7-4.9 mmol/L).  After meals (postprandial): below 180 mg/dL (10 mmol/L).  A1c level: less than 7%. Supplies needed:  Blood glucose meter.  Test strips for your meter. Each meter has its own strips. You must use the strips that came with your meter.  A needle to prick your finger (lancet). Do not use a lancet more than one time.  A device that holds the lancet (lancing device).  A journal or log book to write down your results. How to check your blood glucose  1. Wash your hands with soap and water. 2. Prick the side of your finger (not the tip) with the lancet. Use a different finger each time. 3. Gently rub the finger until a small drop of blood appears. 4. Follow instructions that come with your meter for inserting the test strip, applying blood to the strip, and using your blood glucose meter. 5. Write down your result and any notes. Some meters allow you to use areas of your body other than your finger (alternative sites) to test your blood. The most common alternative sites are:  Forearm.  Thigh.  Palm of the hand. If you think you may have hypoglycemia, or if you have a history of not knowing when your blood glucose is getting low (hypoglycemia unawareness), do not use alternative sites. Use your finger instead. Alternative sites may not be as accurate as the fingers, because blood flow is slower in these areas. This means that the result you get may be delayed, and it may be different from  the result that you would get from your finger. Follow these instructions at home: Blood glucose log   Every time you check your blood glucose, write down your result. Also write down any notes about things that may be affecting your blood glucose, such as your diet and exercise for the day. This information can help you and your health care provider: ? Look for patterns in your blood glucose over time. ? Adjust your diabetes management plan as needed.  Check if your meter allows you to download your records to a computer. Most glucose meters store a record of glucose readings in the meter. If you have type 1 diabetes:  Check your blood glucose 2 or more times a day.  Also check your blood glucose: ? Before every insulin injection. ? Before and after exercise. ? Before meals. ? 2 hours after a meal. ? Occasionally between 2:00 a.m. and 3:00 a.m., as directed. ? Before potentially dangerous tasks, like driving or using heavy machinery. ? At bedtime.  You may need to check your blood glucose more  often, up to 6-10 times a day, if you: ? Use an insulin pump. ? Need multiple daily injections (MDI). ? Have diabetes that is not well-controlled. ? Are ill. ? Have a history of severe hypoglycemia. ? Have hypoglycemia unawareness. If you have type 2 diabetes:  If you take insulin or other diabetes medicines, check your blood glucose 2 or more times a day.  If you are on intensive insulin therapy, check your blood glucose 4 or more times a day. Occasionally, you may also need to check between 2:00 a.m. and 3:00 a.m., as directed.  Also check your blood glucose: ? Before and after exercise. ? Before potentially dangerous tasks, like driving or using heavy machinery.  You may need to check your blood glucose more often if: ? Your medicine is being adjusted. ? Your diabetes is not well-controlled. ? You are ill. General tips  Always keep your supplies with you.  If you have  questions or need help, all blood glucose meters have a 24-hour "hotline" phone number that you can call. You may also contact your health care provider.  After you use a few boxes of test strips, adjust (calibrate) your blood glucose meter by following instructions that came with your meter. Contact a health care provider if:  Your blood glucose is at or above 240 mg/dL (63.7 mmol/L) for 2 days in a row.  You have been sick or have had a fever for 2 days or longer, and you are not getting better.  You have any of the following problems for more than 6 hours: ? You cannot eat or drink. ? You have nausea or vomiting. ? You have diarrhea. Get help right away if:  Your blood glucose is lower than 54 mg/dL (3 mmol/L).  You become confused or you have trouble thinking clearly.  You have difficulty breathing.  You have moderate or large ketone levels in your urine. Summary  Monitoring your blood sugar (glucose) is an important part of managing your diabetes (diabetes mellitus).  Blood glucose monitoring involves checking your blood glucose as often as directed and keeping a record (log) of your results over time.  Your health care provider will set individualized treatment goals for you. Your goals will be based on your age, other medical conditions you have, and how you respond to diabetes treatment.  Every time you check your blood glucose, write down your result. Also write down any notes about things that may be affecting your blood glucose, such as your diet and exercise for the day. This information is not intended to replace advice given to you by your health care provider. Make sure you discuss any questions you have with your health care provider. Document Revised: 06/08/2018 Document Reviewed: 01/25/2016 Elsevier Patient Education  2020 [employer].

## 2020-06-12 NOTE — Chronic Care Management (AMB) (Signed)
Chronic Care Management Pharmacy  Name: [name]  MRN: [id number] DOB: Apr 12, 1951  Chief Complaint/ HPI  [name],  69 y.o., male presents for their Follow-Up CCM visit with the clinical pharmacist via telephone.  PCP : Jinny Sanders, MD  Their chronic conditions addressed today include: depression, diabetes, hypertension, CAD, hyperlipidemia   Primary patient concern: Patient has not brought proof of income for Trulicity application. He states he will bring 06/15/2020. Patient states he is not interested in life since his wife died.   Office Visits:  05/29/20: flu shot.   04/21/20 : Patient having morning low blood sugars with Insulin 70/30 80 units in the morning and 40 units in the evening. Daughter is helping him complete Trulicity PAP form. Try Crestor 1/2 tablet three times weekly. Resume Trulicity 1.5 mg once arrives.   01/21/20: PCP visit - DM on insulin 70/30 70 units AM, 40-50 units at night, denies taking Trulicity, uses libre on and off, A1c 74%, restart Trulicity at 1.5 mg daily; pt stopped Crestor due to arm and should pain, try 1/2 tablet daily with CoQ10, HTN controlled   11/27/19: CCM visit - assist with adherence/bubble packs through [place], increase Trulicity to 1.5 (pt has not yet increased)  10/15/19: PCP visit - out of Trulicity for 1 month, increase Trulicity to 1.5 mg weekly, restart Crestor, (off antifungal), purchase BP cuff and monitor at home  Consult Visit:   05/06/2020 [facility] Nurse visit - Patient having affordability issues with Trulicity. Discrepancy on med list. Did not attend heart failure clinic appointment post-hospital.   04/03/2020 [facility] Nurse - Patient was hospitalized for heart failure 08/03-08/05.   12/24/19: Cardiology - 12 month follow up, back on statin, thinks he is retaining fluid, takes Lasix 40 mg daily and extra tablet PRN. GERD, stable on PPI, HTN controlled, no med changes, RTC 6 months   Allergies  Allergen  Reactions  . Atorvastatin Other (See Comments)    Body aches   Medications: Outpatient Encounter Medications as of 06/12/2020  Medication Sig Note  . aspirin 81 MG EC tablet Take 1 tablet (81 mg total) by mouth daily.   . carvedilol (COREG) 6.25 MG tablet Take 1 tablet (6.25 mg total) by mouth daily.   . cholecalciferol (VITAMIN D3) 25 MCG (1000 UNIT) tablet Take 2,000 Units by mouth daily.   . clopidogrel (PLAVIX) 75 MG tablet TAKE 1 TABLET BY MOUTH ONCE DAILY WITH BREAKFAST   . Continuous Blood Gluc Receiver (FREESTYLE LIBRE 14 DAY READER) DEVI 1 Device by Does not apply route every 14 (fourteen) days.   . Continuous Blood Gluc Sensor (FREESTYLE LIBRE 14 DAY SENSOR) MISC USE TO CHECK BLOOD SUGAR AS DIRECTED. CHANGE EVERY 14 DAYS.   Marland Kitchen ezetimibe (ZETIA) 10 MG tablet TAKE 1 TABLET BY MOUTH ONCE DAILY   . furosemide (LASIX) 40 MG tablet Take 1 tablet (40 mg total) by mouth 2 (two) times daily. Can take a 2nd daily dose as needed.   . insulin NPH-regular Human (NOVOLIN 70/30) (70-30) 100 UNIT/ML injection Inject 60 units in the morning and 35 units at bedtime. (Patient taking differently: Per patient report: he is Injecting 70 units in the morning and 50 units at bedtime.) 05/06/2020: Client reports he is taking 70 units in the morning and 40 units in the evening.  . isosorbide mononitrate (IMDUR) 30 MG 24 hr tablet TAKE 1 TABLET BY MOUTH TWICE DAILY   . losartan (COZAAR) 50 MG tablet Take 1  tablet (50 mg total) by mouth daily.   . nitroGLYCERIN (NITROSTAT) 0.4 MG SL tablet DISSOLVE 1 TABLET UNDER TONGUE AS NEEDEDFOR CHEST PAIN. MAY REPEAT 5 MINUTES APART 3 TIMES IF NEEDED   . potassium chloride SA (K-DUR) 20 MEQ tablet Take 1 tablet (20 mEq total) by mouth daily.   . rosuvastatin (CRESTOR) 40 MG tablet Take 40 mg by mouth 3 (three) times a week.   . venlafaxine XR (EFFEXOR-XR) 150 MG 24 hr capsule TAKE 1 CAPSULE BY MOUTH ONCE DAILY WITH BREAKFAST. TAKE WITH 75 MG DOSE TO EQUAL 225MG DAILY   .  venlafaxine XR (EFFEXOR-XR) 75 MG 24 hr capsule TAKE 1 CAPSULE BY MOUTH ONCE DAILY. TAKEIN ADDITION TO THE 150 MG CAPSULE TO EQUAL A TOTAL DOSE OF 225 MG DAILY   . Blood Glucose Calibration (ONETOUCH VERIO) High SOLN  (Patient not taking: Reported on 06/12/2020)   . Blood Glucose Monitoring Suppl ([place]) w/Device KIT  (Patient not taking: Reported on 06/12/2020)   . Lancets (ONETOUCH DELICA PLUS [id number]) [place]  (Patient not taking: Reported on 06/12/2020)   . ONETOUCH VERIO test strip  (Patient not taking: Reported on 06/12/2020)    No facility-administered encounter medications on file as of 06/12/2020.   Current Diagnosis/Assessment: Goals    .  Pharmacy Care Plan      CARE PLAN ENTRY (see longitudinal plan of care for additional care plan information)  Current Barriers:  . Chronic Disease Management support, education, and care coordination needs related to Hyperlipidemia, Diabetes, and Depression  Hyperlipidemia Lab Results  Component Value Date/Time   LDLCALC 92 01/07/2020 07:40 AM   LDLDIRECT 174.0 10/19/2017 04:00 PM .  Pharmacist Clinical Goal(s): o Over the next 3 months, patient will work with PharmD and providers to achieve LDL goal < 70 . Current regimen:  o Ezetimibe 10 mg - 1 tablet daily  . Interventions: o Retry rosuvastatin 40 mg - 1 tablet every other day . Patient self care activities - Over the next 3 months, patient will: o Resume rosuvastatin 40 mg - 1 tablet every other day. o Call if you have any joint pain.   Diabetes Lab Results  Component Value Date/Time   HGBA1C 10.0 (H) 01/07/2020 07:40 AM   HGBA1C 10.1 (A) 10/15/2019 03:04 PM   HGBA1C 10.5 (H) 06/21/2019 07:57 AM   HGBA1C 9.6 10/02/2018 12:00 AM .  Pharmacist Clinical Goal(s): o Over the next 3 months, patient will work with PharmD and providers to achieve A1c goal <7% . Current regimen:  o Novolin 70/30 - Inject 70 units in the morning and 40 units at evening  meal . Interventions: o Recommend taking insulin 30-45 minutes before breakfast and supper to avoid low blood glucose . Patient self care activities - Over the next 3 months, patient will: o Check blood sugar daily with Freestyle Libre continuous glucose monitor o Contact provider with any episodes of hypoglycemia o Bring patient assistance forms to office for Trulicity o Begin taking your insulin 30 minutes before you eat instead of after meals o Try limiting your carbohydrates at each meal to 45-60 grams and 15-30 grams for snacks  Depression . Pharmacist Clinical Goal(s) o Over the next 3 months, patient will work with PharmD and providers to improve depressed mood . Current regimen:  o Venlafaxine XR 75 mg and 150 mg - take total dose of 225 mg once daily . Interventions: o Recommend setting a bedtime routine and sleeping between the hours of 10  PM and 8 AM.  . Patient self care activities - Over the next 3 months, patient will: o Continue to mow 2-3 lawns per week. o Consider attending your church again. o Walk to your mailbox daily. o Try to avoid daytime naps and move bedtime up to before midnight. Avoid TV after 11 PM. Avoid caffeine after lunch.   Please see past updates related to this goal by clicking on the "Past Updates" button in the selected goal             Diabetes  Recent Relevant Labs: Lab Results  Component Value Date/Time   HGBA1C 11.1 (H) 04/14/2020 08:34 AM   HGBA1C 11.0 (H) 03/31/2020 09:49 PM   HGBA1C 9.6 10/02/2018 12:00 AM   MICROALBUR <0.7 02/21/2017 01:57 PM   MICROALBUR 0.8 02/16/2016 08:24 AM     Patient is currently uncontrolled on the following medications:   Novolin 70/30 - Inject 70 units in the morning and 50 units at evening meal   Update 06/12/2020 -  Still not taking Trulicity - needs to bring in patient assistance forms for signing, reports he will bring it Monday 06/15/20. Was supposed to bring back in July and September but  patient states he forget things easily and doesn't always feel like driving this far. Spoke to Daughter in September as well and provided her the number for [employer] [employer] to determine if patient is eligible for any extra assistance.   Diet: Patient reports that he does not eat the way he should. His libre often reads "high" because can't register number. He eats ice cream but says he doesn't plan to buy any more. Today he went to get a biscuit at [facility] for breakfast. Patient does indicate that he can keep his sugar better when he eats better.   Blood sugar readings: Reports home readings of 75-110 mg/dL in the morning and 200 mg/dL in the morning.     Plan: Bring in patient assistance forms. Resume Trulicity once available. Contact [employer] to see if eligible for any form of extra help with medications. Continue to wear freestyle libre daily.    MDD   PHQ-9 (11/27/19): 15 moderately severe depression Symptoms: Continues to report low energy, lack of motivation to leave home. He only leaves home to mow 2-3 lawns per week and occasional trip to [employer].  Usually stays in bed until 11-12 AM and stays up watching TV until 2-3 AM each night. However, he was enjoying a television show today, which he previously reported he no longer enjoyed TV and has been checking his mail daily, which he previously reports would pile up for weeks. Sleep hygiene: Continues to take multiple naps during the day and drinks diet soda.  Goal: At last visit set goal of mowing 2-3 lawns per week, go to church  on Easter Sunday, check mail daily, reduce caffeine intake and limit napping, participate in 1 additional activity outside of the home/week. Pt has not been to church and reports his biggest problem is not feeling like going anywhere or being around others. He does have a friend call to check in on him every day. He is mowing more lawns and checking mail daily. No change in naps or caffeine. He watches church services on  TV.  Patient has failed these meds in past: multiple  Patient is currently uncontrolled on the following medications:   Venlafaxine XR 75 mg and 150 mg - take total dose of 225 mg once daily   We discussed:  would like to stay on venlafaxine as he believes it helps Update 06/12/2020:  Assessment: patient states that he sleeps a lot. He has a friend who moved in his home 3 weeks ago which has helped him have some company. Patient states he has no desire to go places anymore. He misses his wife. It is coming up on 4 year anniversary of her death. Encouraged patient to get outside and walk dog each day.   Plan: Continue current medications; Recommend getting outside each day and walking dog.    Hyperlipidemia  Lipid Panel     Component Value Date/Time   CHOL 173 04/14/2020 0834   TRIG 99.0 04/14/2020 0834   HDL 40.30 04/14/2020 0834   CHOLHDL 4 04/14/2020 0834   VLDL 19.8 04/14/2020 0834   LDLCALC 113 (H) 04/14/2020 0834   LDLDIRECT 174.0 10/19/2017 1600   LDL goal < 70  Patient has failed these meds in past: atorvastatin Patient is currently uncontrolled on the following medications:   Rosuvastatin 40 mg - 1/2 tablet daily   Ezetimibe 10 mg - 1 tablet daily   Adherence: Reports that he is tolerating Crestor 40 mg three times a week.   Plan: Restart rosuvastatin 40 mg - 1 tablet three times a week.   Hypertension   CMP Latest Ref Rng & Units 04/14/2020 04/02/2020 04/01/2020  Glucose 70 - 99 mg/dL 212(H) 219(H) 353(H)  BUN 6 - 23 mg/dL 17 27(H) 23  Creatinine 0.40 - 1.50 mg/dL 0.97 1.12 1.09  Sodium 135 - 145 mEq/L 139 138 138  Potassium 3.5 - 5.1 mEq/L 4.0 4.4 3.4(L)  Chloride 96 - 112 mEq/L 100 99 99  CO2 19 - 32 mEq/L 31 30 27   Calcium 8.4 - 10.5 mg/dL 9.4 8.9 8.9  Total Protein 6.0 - 8.3 g/dL 7.2 - -  Total Bilirubin 0.2 - 1.2 mg/dL 0.3 - -  Alkaline Phos 39 - 117 U/L 85 - -  AST 0 - 37 U/L 16 - -  ALT 0 - 53 U/L 19 - -   Office blood pressures are: BP Readings from  Last 3 Encounters:  04/02/20 (!) 137/50  01/21/20 138/60  12/24/19 140/64   Patient is currently uncontrolled on the following medications:   Carvedilol 6.25 mg - 1 tablet daily  Losartan 50 mg - 1 tablet daily   Patient has failed these meds in the past: none reported  Previously encouraged home BP monitoring, patient did not pick up BP cuff as recommended. Office visit BP readings have improved. Adherence: Patient's adherence is 90-99% on losartan per fill history. .  Plan: Continue current medications.   CCM Follow Up:  at 09/02/2020 @ 11 AM (telephone visit)   Sherre Poot, PharmD, [facility] Clinical Pharmacist [facility] [phone] (office) [phone] (mobile)

## 2020-06-22 ENCOUNTER — Telehealth: Payer: Self-pay | Admitting: *Deleted

## 2020-06-22 NOTE — Telephone Encounter (Signed)
Received fax today from [facility] stating [name] no longer requires most patients to provide income documentation to apply for its patient assistance program.  Application for [name] faxed into [facility] at [phone].  FYI to Dr. Ermalene Searing.

## 2020-06-23 NOTE — Telephone Encounter (Signed)
Received fax from [facility] stating patient has been approved until the end of the year 2021.

## 2020-06-29 ENCOUNTER — Other Ambulatory Visit: Payer: Self-pay

## 2020-06-29 NOTE — Patient Outreach (Addendum)
[facility]) Care Management Chronic Special Needs Program  06/29/2020  Name: [name] DOB: 09/30/1950  MRN: [id number]  [name] is enrolled in a chronic special needs plan for Diabetes. RNCM called to follow up, review and update care plan.  Subjective: client reports "I'm doing fine". Client reports he has been to primary care provider since hospitalization and has been in contact with his Cardiologist, Dr. Odis [name]. Client denies any signs/symptoms of heart failure exacerbation. Client states he is weighting self daily and weight today 295.3 pounds. Client states he sees cardiologist annually and that the office will call him when it is time to reschedule a visit for next year. He reports, in the meantime, if he has any problem or concerns he will call cardiologist.   Client reports blood sugar at this time is 210 after eating. He is using a freestyle libre glucose meter. He states his blood sugars are around 120 in the morning, but tend to be higher as the day goes on. Client is working with provider pharmacist to obtain medication assistance for Trulicity.  He continues to have no desire to go out and socialize since the loss of his wife and declines referral to social work at this time. Client reports he does things that he needs to do in the house, but does not like to go out and socialize. Client reports he has another follow up with primary care provider next month.   Goals Addressed              This Visit's Progress   .   Acknowledge receipt of Advanced Directive package (pt-stated)   On track     Call your RN care management coordinator if you have any questions.    .  Client verbalize knowledge of Heart Failure disease self management within the next 6 months.   On track     Reviewed signs/symptoms of heart failure exacerbation.  Reports weighs self every day. It is important to weigh daily and write down weights.  Continue to take medications as  prescribed by your provider. Discussed the importance of monitoring intake of fluid.        Marland Kitchen  HEMOGLOBIN A1C < 7 (pt-stated)        Hemoglobin A1C 11.1 Referral made to [facility]  Continue Diabetes self management actions: Have your A1C checked every 6 months if you are at goal or every 3 months if you are not at goal. Know your target A1C goal? Know your target blood sugar range? Plan to eat low carbohydrate and low salt meals, watch portion sizes and avoid sugar sweetened drinks. Increase your exercise/activity if you are able to . Follow doctor recommendations. Take medications as prescribed. Attend provider visits as scheduled.     Marland Kitchen  LIFESTYLE - DECREASE FALLS RISK   On track     Continue fall prevention strategies.  Reinforced the importance of being active and maintaining muscle tone and strength. Continue to be as active and maintain muscle tone-walking.     .  Maintain timely refills of diabetic medication as prescribed within the year .   On track     Medications reviewed with client. Continue to take your medications as prescribed. Please continue to communicate with Clinical Pharmacist at [facility] as they are there to also help you achieve your goals. RNCM encouraged client to contact provider office pharmacy staff, who is providing medication assistance regarding status  of your application. Client to contact provider office to follow up on status of assistance application.       .  COMPLETED: Obtain Hemoglobin A1C at least 2 times per year        A1C 11.1 on 04/14/20; A1C 11 on 03/31/20;  A1C 10.0 on 01/07/20    .  COMPLETED: Visit Primary Care Provider or Endocrinologist at least 2 times per year         Latest visit with primary care provider completed (tele-health) on 04/21/20; 01/21/20 and 10/15/19. Client reports preference to follow up with his cardiologist and states he has telephonically followed up with  cardiologist.       Plan: referral to [facility] to assist with diabetes goal/nutrition. Send updated care plan to client; send updated care plan to primary care provider. [facility] RNCM will outreach in 6 months per Tier level. [facility] Care Management will continue to provide services for this member through 08/28/2020. The [facility] care management team will assume care 08/29/2020.    Kathyrn Sheriff, RN, MSN, [facility] Chronic Care Management Coordinator [facility] [phone]

## 2020-06-30 NOTE — Chronic Care Management (AMB) (Signed)
Opened in error

## 2020-07-13 ENCOUNTER — Other Ambulatory Visit: Payer: Self-pay | Admitting: Family Medicine

## 2020-07-28 ENCOUNTER — Other Ambulatory Visit: Payer: Self-pay | Admitting: Cardiovascular Disease

## 2020-07-28 DIAGNOSIS — H348112 Central retinal vein occlusion, right eye, stable: Secondary | ICD-10-CM | POA: Diagnosis not present

## 2020-07-28 DIAGNOSIS — E113393 Type 2 diabetes mellitus with moderate nonproliferative diabetic retinopathy without macular edema, bilateral: Secondary | ICD-10-CM | POA: Diagnosis not present

## 2020-07-28 DIAGNOSIS — H34812 Central retinal vein occlusion, left eye, with macular edema: Secondary | ICD-10-CM | POA: Diagnosis not present

## 2020-07-28 DIAGNOSIS — H35372 Puckering of macula, left eye: Secondary | ICD-10-CM | POA: Diagnosis not present

## 2020-07-30 ENCOUNTER — Telehealth: Payer: Self-pay | Admitting: Family Medicine

## 2020-07-30 DIAGNOSIS — E1139 Type 2 diabetes mellitus with other diabetic ophthalmic complication: Secondary | ICD-10-CM

## 2020-07-30 DIAGNOSIS — IMO0002 Reserved for concepts with insufficient information to code with codable children: Secondary | ICD-10-CM

## 2020-07-30 DIAGNOSIS — D649 Anemia, unspecified: Secondary | ICD-10-CM

## 2020-07-30 DIAGNOSIS — Z125 Encounter for screening for malignant neoplasm of prostate: Secondary | ICD-10-CM

## 2020-07-30 DIAGNOSIS — E559 Vitamin D deficiency, unspecified: Secondary | ICD-10-CM

## 2020-07-30 DIAGNOSIS — E538 Deficiency of other specified B group vitamins: Secondary | ICD-10-CM

## 2020-07-30 NOTE — Telephone Encounter (Signed)
-----   Message from Alvina Chou sent at 07/14/2020  2:22 PM EST ----- Regarding: lab orders for Friday, 12.3.21 Patient is scheduled for CPX labs, please order future labs, Thanks , Camelia Eng

## 2020-07-31 ENCOUNTER — Other Ambulatory Visit (INDEPENDENT_AMBULATORY_CARE_PROVIDER_SITE_OTHER): Payer: HMO

## 2020-07-31 ENCOUNTER — Other Ambulatory Visit: Payer: Self-pay

## 2020-07-31 DIAGNOSIS — D649 Anemia, unspecified: Secondary | ICD-10-CM

## 2020-07-31 DIAGNOSIS — E559 Vitamin D deficiency, unspecified: Secondary | ICD-10-CM

## 2020-07-31 DIAGNOSIS — IMO0002 Reserved for concepts with insufficient information to code with codable children: Secondary | ICD-10-CM

## 2020-07-31 DIAGNOSIS — E1165 Type 2 diabetes mellitus with hyperglycemia: Secondary | ICD-10-CM | POA: Diagnosis not present

## 2020-07-31 DIAGNOSIS — Z125 Encounter for screening for malignant neoplasm of prostate: Secondary | ICD-10-CM

## 2020-07-31 DIAGNOSIS — E1139 Type 2 diabetes mellitus with other diabetic ophthalmic complication: Secondary | ICD-10-CM | POA: Diagnosis not present

## 2020-07-31 LAB — CBC WITH DIFFERENTIAL/PLATELET
Basophils Absolute: 0.1 10*3/uL (ref 0.0–0.1)
Basophils Relative: 0.8 % (ref 0.0–3.0)
Eosinophils Absolute: 0.1 10*3/uL (ref 0.0–0.7)
Eosinophils Relative: 2 % (ref 0.0–5.0)
HCT: 40.2 % (ref 39.0–52.0)
Hemoglobin: 13.2 g/dL (ref 13.0–17.0)
Lymphocytes Relative: 24.4 % (ref 12.0–46.0)
Lymphs Abs: 1.6 10*3/uL (ref 0.7–4.0)
MCHC: 32.9 g/dL (ref 30.0–36.0)
MCV: 88.9 fl (ref 78.0–100.0)
Monocytes Absolute: 0.5 10*3/uL (ref 0.1–1.0)
Monocytes Relative: 7.8 % (ref 3.0–12.0)
Neutro Abs: 4.3 10*3/uL (ref 1.4–7.7)
Neutrophils Relative %: 65 % (ref 43.0–77.0)
Platelets: 198 10*3/uL (ref 150.0–400.0)
RBC: 4.53 Mil/uL (ref 4.22–5.81)
RDW: 14.8 % (ref 11.5–15.5)
WBC: 6.6 10*3/uL (ref 4.0–10.5)

## 2020-07-31 LAB — LIPID PANEL
Cholesterol: 107 mg/dL (ref 0–200)
HDL: 34.4 mg/dL — ABNORMAL LOW (ref >39.00)
LDL Cholesterol: 52 mg/dL (ref 0–99)
NonHDL: 72.18
Total CHOL/HDL Ratio: 3
Triglycerides: 102 mg/dL (ref 0.0–149.0)
VLDL: 20.4 mg/dL (ref 0.0–40.0)

## 2020-07-31 LAB — PSA, MEDICARE: PSA: 0.27 ng/ml (ref 0.10–4.00)

## 2020-07-31 LAB — COMPREHENSIVE METABOLIC PANEL
ALT: 12 U/L (ref 0–53)
AST: 12 U/L (ref 0–37)
Albumin: 3.9 g/dL (ref 3.5–5.2)
Alkaline Phosphatase: 85 U/L (ref 39–117)
BUN: 19 mg/dL (ref 6–23)
CO2: 31 mEq/L (ref 19–32)
Calcium: 9.1 mg/dL (ref 8.4–10.5)
Chloride: 103 mEq/L (ref 96–112)
Creatinine, Ser: 1 mg/dL (ref 0.40–1.50)
GFR: 76.14 mL/min (ref >60.00)
Glucose, Bld: 218 mg/dL — ABNORMAL HIGH (ref 70–99)
Potassium: 4.4 mEq/L (ref 3.5–5.1)
Sodium: 142 mEq/L (ref 135–145)
Total Bilirubin: 0.4 mg/dL (ref 0.2–1.2)
Total Protein: 6.7 g/dL (ref 6.0–8.3)

## 2020-07-31 LAB — VITAMIN D 25 HYDROXY (VIT D DEFICIENCY, FRACTURES): VITD: 37.72 ng/mL (ref 30.00–100.00)

## 2020-07-31 LAB — HEMOGLOBIN A1C: Hgb A1c MFr Bld: 8.6 % — ABNORMAL HIGH (ref 4.6–6.5)

## 2020-07-31 NOTE — Progress Notes (Signed)
No critical labs need to be addressed urgently. We will discuss labs in detail at upcoming office visit.   

## 2020-08-04 ENCOUNTER — Other Ambulatory Visit: Payer: Self-pay

## 2020-08-04 ENCOUNTER — Other Ambulatory Visit: Payer: Self-pay | Admitting: Cardiovascular Disease

## 2020-08-04 NOTE — Patient Outreach (Signed)
  [facility]) Care Management Chronic Special Needs Program    08/04/2020  Name: [name], DOB: [birth year]  MRN: [id number]   [name] is enrolled in a chronic special needs plan for Diabetes. [facility] Care Management will continue to provide services for this member through 08/28/2020. The [facility] Care Management Team will assume care 08/29/2020.  Kathyrn Sheriff, RN, MSN, [facility] Chronic Care Management Coordinator [facility] [phone]

## 2020-08-07 ENCOUNTER — Other Ambulatory Visit: Payer: Self-pay

## 2020-08-07 ENCOUNTER — Encounter: Payer: Self-pay | Admitting: Family Medicine

## 2020-08-07 ENCOUNTER — Telehealth: Payer: Self-pay

## 2020-08-07 ENCOUNTER — Telehealth (INDEPENDENT_AMBULATORY_CARE_PROVIDER_SITE_OTHER): Payer: HMO | Admitting: Family Medicine

## 2020-08-07 VITALS — Ht 66.0 in | Wt 294.0 lb

## 2020-08-07 DIAGNOSIS — E1169 Type 2 diabetes mellitus with other specified complication: Secondary | ICD-10-CM

## 2020-08-07 DIAGNOSIS — E785 Hyperlipidemia, unspecified: Secondary | ICD-10-CM

## 2020-08-07 DIAGNOSIS — E113393 Type 2 diabetes mellitus with moderate nonproliferative diabetic retinopathy without macular edema, bilateral: Secondary | ICD-10-CM | POA: Diagnosis not present

## 2020-08-07 DIAGNOSIS — E1165 Type 2 diabetes mellitus with hyperglycemia: Secondary | ICD-10-CM | POA: Diagnosis not present

## 2020-08-07 DIAGNOSIS — IMO0002 Reserved for concepts with insufficient information to code with codable children: Secondary | ICD-10-CM

## 2020-08-07 DIAGNOSIS — E1159 Type 2 diabetes mellitus with other circulatory complications: Secondary | ICD-10-CM

## 2020-08-07 DIAGNOSIS — I152 Hypertension secondary to endocrine disorders: Secondary | ICD-10-CM

## 2020-08-07 DIAGNOSIS — E1139 Type 2 diabetes mellitus with other diabetic ophthalmic complication: Secondary | ICD-10-CM

## 2020-08-07 NOTE — Assessment & Plan Note (Signed)
Needs better DM cotnrol. Followed by opthamology.

## 2020-08-07 NOTE — Assessment & Plan Note (Signed)
Chronic, improving control.. A1C down from 11.1 (NOVOLIN 70/30) (70-30) 100 UNIT/ML injection, Inject 60 units in the morning and 35 units at bedtime. (Patient taking differently: Per patient report: he is Injecting 70 units in the morning and 40 units at bedtime.)  Started back on Trulicity 0.75 mg weekly in last 5 weeks.. feeling more full.  Discussed healthy diet and avoiding skipping meals. If not continuing to decrease at next check... consider increase in Trulicity.  If continued AM lows... reduce 70/30 dose PM and use bedtime healthy snack.

## 2020-08-07 NOTE — Assessment & Plan Note (Signed)
Stable, chronic.  Continue current medication. crestor 40, 3 times a week and zetia 10 mg daily.

## 2020-08-07 NOTE — Chronic Care Management (AMB) (Addendum)
Chronic Care Management Pharmacy Assistant   Name: [name]  MRN: [id number] DOB: 06-10-1951  Reason for Encounter: Disease State  Patient Questions:  1.  Have you seen any other providers since your last visit? Yes 08/07/20 - Dr. Kerby Nora- PCP  2.  Any changes in your medicines or health? No   PCP : [name], MD  Allergies:   Allergies  Allergen Reactions   Atorvastatin Other (See Comments)    Body aches    Medications: Outpatient Encounter Medications as of 08/07/2020  Medication Sig Note   aspirin 81 MG EC tablet Take 1 tablet (81 mg total) by mouth daily.    carvedilol (COREG) 6.25 MG tablet TAKE 1 TABLET BY MOUTH ONCE DAILY    cholecalciferol (VITAMIN D3) 25 MCG (1000 UNIT) tablet Take 2,000 Units by mouth daily.    clopidogrel (PLAVIX) 75 MG tablet TAKE 1 TABLET BY MOUTH ONCE DAILY WITH BREAKFAST    Continuous Blood Gluc Receiver (FREESTYLE LIBRE 14 DAY READER) DEVI 1 Device by Does not apply route every 14 (fourteen) days.    Continuous Blood Gluc Sensor (FREESTYLE LIBRE 14 DAY SENSOR) MISC USE TO CHECK BLOOD SUGAR AS DIRECTED. CHANGE EVERY 14 DAYS.    ezetimibe (ZETIA) 10 MG tablet TAKE 1 TABLET BY MOUTH ONCE DAILY    furosemide (LASIX) 40 MG tablet Take 1 tablet (40 mg total) by mouth 2 (two) times daily. Can take a 2nd daily dose as needed. 06/29/2020: Reports takes 40 mg every day and an additional 40 mg if needed.   insulin NPH-regular Human (NOVOLIN 70/30) (70-30) 100 UNIT/ML injection Inject 60 units in the morning and 35 units at bedtime. (Patient taking differently: Per patient report: he is Injecting 70 units in the morning and 50 units at bedtime.) 05/06/2020: Client reports he is taking 70 units in the morning and 40 units in the evening.   isosorbide mononitrate (IMDUR) 30 MG 24 hr tablet TAKE 1 TABLET BY MOUTH TWICE DAILY    losartan (COZAAR) 50 MG tablet Take 1 tablet (50 mg total) by mouth daily.    nitroGLYCERIN (NITROSTAT) 0.4 MG SL tablet  DISSOLVE 1 TABLET UNDER TONGUE AS NEEDEDFOR CHEST PAIN. MAY REPEAT 5 MINUTES APART 3 TIMES IF NEEDED    potassium chloride SA (K-DUR) 20 MEQ tablet Take 1 tablet (20 mEq total) by mouth daily.    rosuvastatin (CRESTOR) 40 MG tablet Take 40 mg by mouth 3 (three) times a week.    venlafaxine XR (EFFEXOR-XR) 150 MG 24 hr capsule TAKE 1 CAPSULE BY MOUTH ONCE DAILY WITH BREAKFAST. TAKE WITH 75 MG DOSE TO EQUAL 225MG  DAILY    venlafaxine XR (EFFEXOR-XR) 75 MG 24 hr capsule TAKE 1 CAPSULE BY MOUTH ONCE DAILY. TAKEIN ADDITION TO THE 150 MG CAPSULE TO EQUAL A TOTAL DOSE OF 225 MG DAILY    No facility-administered encounter medications on file as of 08/07/2020.    Current Diagnosis: Patient Active Problem List   Diagnosis Date Noted   Depression    Chest pain 03/31/2020   Hyperglycemia due to type 2 diabetes mellitus (HCC) 03/31/2020   Acute on chronic diastolic (congestive) heart failure (HCC) 03/31/2020   Tick bite of abdominal wall 04/10/2018   Tinea corporis 04/10/2018   Fatigue 03/06/2018   ED (erectile dysfunction) 03/06/2018   Dermatitis 01/16/2018   Tinea pedis of both feet 01/16/2018   Acute on chronic diastolic CHF (congestive heart failure) (HCC) 09/20/2016   Adjustment disorder with mixed anxiety  and depressed mood 09/09/2016   Gastroesophageal reflux disease 09/09/2016   Coronary artery disease, non-occlusive    Elevated left ventricular end-diastolic pressure (LVEDP) 03/11/2016   Leg swelling 03/11/2016   Anemia 03/11/2016   Hypertensive heart disease with heart failure (HCC)    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)    Coronary artery disease of native artery of native heart with stable angina pectoris (HCC)    Morbid obesity (HCC)    Chronic chest pain    NSTEMI (non-ST elevated myocardial infarction) (HCC)    Advanced care planning/counseling discussion 11/20/2015   Noncompliance with diabetes treatment 11/05/2015   Noncompliance with diet and medication  regimen 11/05/2015   Diabetic retinopathy (HCC) 09/23/2014   Snoring 12/21/2012   HYPOGONADISM 09/28/2010   B12 deficiency 09/28/2010   Vitamin D deficiency 09/28/2010   OTHER MALAISE AND FATIGUE 09/17/2010   TRIGGER FINGER, RIGHT MIDDLE 09/14/2010   BRANCH RETINAL VEIN OCCLUSION 11/26/2009   Leg pain 10/06/2009   CONSTIPATION 08/10/2009   GASTRITIS 07/29/2009   Major depressive disorder, recurrent episode, moderate (HCC) 07/06/2007   ERECTILE DYSFUNCTION 04/10/2007   RENAL CALCULUS, HX OF 03/26/2007   Hyperlipidemia associated with type 2 diabetes mellitus (HCC) 12/05/2006   Hypertension associated with diabetes (HCC) 12/05/2006   Osteoarthritis 12/05/2006   Recent Relevant Labs: Lab Results  Component Value Date/Time   HGBA1C 8.6 (H) 07/31/2020 08:41 AM   HGBA1C 11.1 (H) 04/14/2020 08:34 AM   HGBA1C 9.6 10/02/2018 12:00 AM   MICROALBUR <0.7 02/21/2017 01:57 PM   MICROALBUR 0.8 02/16/2016 08:24 AM    Kidney Function Lab Results  Component Value Date/Time   CREATININE 1.00 07/31/2020 08:41 AM   CREATININE 0.97 04/14/2020 08:34 AM   GFR 76.14 07/31/2020 08:41 AM   GFRNONAA >60 04/02/2020 06:17 AM   GFRAA >60 04/02/2020 06:17 AM    Current antihyperglycemic regimen:   (NOVOLIN 70/30) (70-30) 100 UNIT/ML injection, Inject 60 units in the morning and 35 units at bedtime.   Trulicity 0.75 mg weekly  What recent interventions/DTPs have been made to improve glycemic control:  Started back on Trulicity about 5 weeks ago. States he is doing very well with this.  Have there been any recent hospitalizations or ED visits since last visit with CPP? No   Patient denies hypoglycemic symptoms, including Pale, Sweaty, Shaky, Hungry, Nervous/irritable and Vision changes   Patient denies hyperglycemic symptoms, including blurry vision, excessive thirst, fatigue, polyuria and weakness   How often are you checking your blood sugar? 3-4 times daily   What are your blood sugars ranging?  States he does not have any written logs and he does not know how to work the meter to look at the memory. He was asked to write down about 7 days worth of readings in the next month and we would follow up with him.  Fasting: "Ranging from 80-120" Before meals: N/A After meals: N/A Bedtime: N/A  During the week, how often does your blood glucose drop below 70? Never Are you checking your feet daily/regularly?  Yes, denies any redness, blisters or sores.  Adherence Review: Is the patient currently on a STATIN medication? Yes rosuvastatin Is the patient currently on ACE/ARB medication? Yes losartan Does the patient have >5 day gap between last estimated fill dates? Yes - no refill history on rosuvastatin, Losartan past due, last filled 04/22/20 90 DS   Reviewed - asked Lillia Abed to call patient and encourage him to refill and resume taking rosuvastatin 1/2 tablet 3  days per week to reduce his risk of heart attack or stroke. Also encourage refill of losartan as is it should have run out around 09/23/19.   Follow-Up:  Pharmacist Review   [name], CPP notified  [name], [facility] Clinical Pharmacy Assistant [phone]

## 2020-08-07 NOTE — Assessment & Plan Note (Signed)
Stable, chronic.  Continue current medication.   On coreg 6.25 mg daily, losartan 50 mg daily.

## 2020-08-07 NOTE — Progress Notes (Signed)
VIRTUAL VISIT Due to national recommendations of social distancing due to COVID 19, a virtual visit is felt to be most appropriate for this patient at this time.   I connected with the patient on 08/07/20 at  2:00 PM EST by virtual telehealth platform and verified that I am speaking with the correct person using two identifiers.   Interactive audio and video telecommunications were attempted between this provider and patient, however failed, due to patient having technical difficulties OR patient did not have access to video capability.  We continued and completed visit with audio only.   I discussed the limitations, risks, security and privacy concerns of performing an evaluation and management service by  virtual telehealth platform and the availability of in person appointments. I also discussed with the patient that there may be a patient responsible charge related to this service. The patient expressed understanding and agreed to proceed.  Patient location: Home Provider Location: [facility] Participants: Kerby Nora and [name]   Chief Complaint  Patient presents with  . Diabetes    History of Present Illness:   69 year old male presents for follow up DM  Diabetes: Improving control.. A1C down from 11.1 (NOVOLIN 70/30) (70-30) 100 UNIT/ML injection, Inject 60 units in the morning and 35 units at bedtime. (Patient taking differently: Per patient report: he is Injecting 70 units in the morning and 40 units at bedtime.)  Started back on Trulicity in last 5 weeks.. feeling more full. Lab Results  Component Value Date   HGBA1C 8.6 (H) 07/31/2020  Using medications without difficulties: Hypoglycemic episodes: once down to 62 after less to eat at night. Hyperglycemic episodes:occ after eating 250 Feet problems: none Blood Sugars averaging: fbs 76-130 eye exam within last year:  Elevated Cholesterol:LDL at goal on crestor 40, 3 times a week and zetia 10 mg daily. Lab  Results  Component Value Date   CHOL 107 07/31/2020   HDL 34.40 (L) 07/31/2020   LDLCALC 52 07/31/2020   LDLDIRECT 174.0 10/19/2017   TRIG 102.0 07/31/2020   CHOLHDL 3 07/31/2020  Using medications without problems:none Muscle aches: none Diet compliance:Trying to eat low carb diet. Exercise:walking Other complaints: Wt Readings from Last 3 Encounters:  08/07/20 294 lb (133.4 kg)  04/21/20 291 lb (132 kg)  04/02/20 291 lb 4.8 oz (132.1 kg)   Hypertension: Not able to check BP at home. Using medication without problems or lightheadedness: none Chest pain with exertion:none Edema: off and on Short of breath:none Average home BPs: Other issues:    COVID 19 screen No recent travel or known exposure to COVID19 The patient denies respiratory symptoms of COVID 19 at this time.  The importance of social distancing was discussed today.   Review of Systems  Constitutional: Positive for malaise/fatigue. Negative for chills and fever.  HENT: Negative for congestion and ear pain.   Eyes: Negative for pain and redness.  Respiratory: Negative for cough and shortness of breath.   Cardiovascular: Negative for chest pain, palpitations and leg swelling.  Gastrointestinal: Negative for abdominal pain, blood in stool, constipation, diarrhea, nausea and vomiting.  Genitourinary: Negative for dysuria.  Musculoskeletal: Negative for falls and myalgias.  Skin: Negative for rash.  Neurological: Negative for dizziness.  Psychiatric/Behavioral: Negative for depression. The patient is not nervous/anxious.       Past Medical History:  Diagnosis Date  . Back injury 02/2002   worker's comp  . CHF (congestive heart failure) (HCC)   . Coronary artery disease,  non-occlusive    a. cath 2002 with no sig CAD;  b. cath 2008 normal LM, LAD, LCx, p&dRCA 20-30%, PDA 30%; c.11/2015 NSTEMI/PCI: LM nl, LAD 95p (2.5x15 Xience DES), LCX nl, RCA 100p/m w/ L->R collats, EF 55-65% c. NSTEMI (02/2016) with no culprit  leision, switched to Brilinta.  d. NSTEMI 03/2016: again, no culprit lesion and switched back to plavix 2/2 SOB with Brilnta.    . Depression   . Diabetes mellitus type 2, insulin dependent (HCC)   . Hyperlipemia   . Hypertensive heart disease   . Kidney stones   . Morbid obesity (HCC)   . Osteoarthritis   . Snoring     reports that he has never smoked. He has never used smokeless tobacco. He reports that he does not drink alcohol and does not use drugs.   Current Outpatient Medications:  .  aspirin 81 MG EC tablet, Take 1 tablet (81 mg total) by mouth daily., Disp: , Rfl:  .  carvedilol (COREG) 6.25 MG tablet, TAKE 1 TABLET BY MOUTH ONCE DAILY, Disp: 90 tablet, Rfl: 0 .  cholecalciferol (VITAMIN D3) 25 MCG (1000 UNIT) tablet, Take 2,000 Units by mouth daily., Disp: , Rfl:  .  clopidogrel (PLAVIX) 75 MG tablet, TAKE 1 TABLET BY MOUTH ONCE DAILY WITH BREAKFAST, Disp: 90 tablet, Rfl: 1 .  Continuous Blood Gluc Receiver (FREESTYLE LIBRE 14 DAY READER) DEVI, 1 Device by Does not apply route every 14 (fourteen) days., Disp: 1 Device, Rfl: 0 .  Continuous Blood Gluc Sensor (FREESTYLE LIBRE 14 DAY SENSOR) MISC, USE TO CHECK BLOOD SUGAR AS DIRECTED. CHANGE EVERY 14 DAYS., Disp: 2 each, Rfl: 5 .  ezetimibe (ZETIA) 10 MG tablet, TAKE 1 TABLET BY MOUTH ONCE DAILY, Disp: 90 tablet, Rfl: 1 .  furosemide (LASIX) 40 MG tablet, Take 1 tablet (40 mg total) by mouth 2 (two) times daily. Can take a 2nd daily dose as needed., Disp: 60 tablet, Rfl: 0 .  insulin NPH-regular Human (NOVOLIN 70/30) (70-30) 100 UNIT/ML injection, Inject 60 units in the morning and 35 units at bedtime. (Patient taking differently: Per patient report: he is Injecting 70 units in the morning and 50 units at bedtime.), Disp: 50 mL, Rfl: 5 .  isosorbide mononitrate (IMDUR) 30 MG 24 hr tablet, TAKE 1 TABLET BY MOUTH TWICE DAILY, Disp: 180 tablet, Rfl: 2 .  losartan (COZAAR) 50 MG tablet, Take 1 tablet (50 mg total) by mouth daily., Disp: 90  tablet, Rfl: 1 .  nitroGLYCERIN (NITROSTAT) 0.4 MG SL tablet, DISSOLVE 1 TABLET UNDER TONGUE AS NEEDEDFOR CHEST PAIN. MAY REPEAT 5 MINUTES APART 3 TIMES IF NEEDED, Disp: 25 tablet, Rfl: 3 .  potassium chloride SA (K-DUR) 20 MEQ tablet, Take 1 tablet (20 mEq total) by mouth daily., Disp: 90 tablet, Rfl: 1 .  rosuvastatin (CRESTOR) 40 MG tablet, Take 40 mg by mouth 3 (three) times a week., Disp: , Rfl:  .  venlafaxine XR (EFFEXOR-XR) 150 MG 24 hr capsule, TAKE 1 CAPSULE BY MOUTH ONCE DAILY WITH BREAKFAST. TAKE WITH 75 MG DOSE TO EQUAL 225MG  DAILY, Disp: 90 capsule, Rfl: 1 .  venlafaxine XR (EFFEXOR-XR) 75 MG 24 hr capsule, TAKE 1 CAPSULE BY MOUTH ONCE DAILY. TAKEIN ADDITION TO THE 150 MG CAPSULE TO EQUAL A TOTAL DOSE OF 225 MG DAILY, Disp: 90 capsule, Rfl: 1   Observations/Objective: Height 5\' 6"  (1.676 m), weight 294 lb (133.4 kg).  Physical Exam  Physical Exam Constitutional:      General: The patient is  not in acute distress. Pulmonary:     Effort: Pulmonary effort is normal. No respiratory distress.  Neurological:     Mental Status: The patient is alert and oriented to person, place, and time.  Psychiatric:        Mood and Affect: Mood normal.        Behavior: Behavior normal.   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 A1C down from 11.1 (NOVOLIN 70/30) (70-30) 100 UNIT/ML injection, Inject 60 units in the morning and 35 units at bedtime. (Patient taking differently: Per patient report: he is Injecting 70 units in the morning and 40 units at bedtime.)  Started back on Trulicity 0.75 mg weekly in last 5 weeks.. feeling more full.  Discussed healthy diet and avoiding skipping meals. If not continuing to decrease at next check... consider increase in Trulicity.  If continued AM lows... reduce 70/30 dose PM and use bedtime healthy snack.   Diabetic retinopathy Needs better DM cotnrol. Followed by  opthamology.  Hyperlipidemia associated with type 2 diabetes mellitus (HCC) Stable, chronic.  Continue current medication. crestor 40, 3 times a week and zetia 10 mg daily.     Hypertension associated with diabetes (HCC) Stable, chronic.  Continue current medication.   On coreg 6.25 mg daily, losartan 50 mg daily.  Total visit time 15 minutes, > 50% spent counseling and cordinating patients care.    I discussed the assessment and treatment plan with the patient. The patient was provided an opportunity to ask questions and all were answered. The patient agreed with the plan and demonstrated an understanding of the instructions.   The patient was advised to call back or seek an in-person evaluation if the symptoms worsen or if the condition fails to improve as anticipated.     Kerby Nora, MD

## 2020-08-13 ENCOUNTER — Telehealth: Payer: Self-pay

## 2020-08-13 NOTE — Chronic Care Management (AMB) (Signed)
Chronic Care Management Pharmacy Assistant   Name: [name]  MRN: [id number] DOB: 06-02-1951  Reason for Encounter: Medication Review  Dr. Ermalene Searing requested that patient be contacted regarding his rosuvastatin and losartan. Dr. Ermalene Searing would like patient to take 1/2 tablet of rosuvastatin 3 times a week and take losartan daily. Patient states he has been taking the rosuvastatin 3 days a week but has 2 bottles of it because he stopped taking it earlier this year when it was causing him pain. He notes that he is taking his losartan daily and just had his medications refilled last week for 90 days.   PCP : William Seltzer, MD  Allergies:   Allergies  Allergen Reactions  . Atorvastatin Other (See Comments)    Body aches    Medications: Outpatient Encounter Medications as of 08/13/2020  Medication Sig Note  . aspirin 81 MG EC tablet Take 1 tablet (81 mg total) by mouth daily.   . carvedilol (COREG) 6.25 MG tablet TAKE 1 TABLET BY MOUTH ONCE DAILY   . cholecalciferol (VITAMIN D3) 25 MCG (1000 UNIT) tablet Take 2,000 Units by mouth daily.   . clopidogrel (PLAVIX) 75 MG tablet TAKE 1 TABLET BY MOUTH ONCE DAILY WITH BREAKFAST   . Continuous Blood Gluc Receiver (FREESTYLE LIBRE 14 DAY READER) DEVI 1 Device by Does not apply route every 14 (fourteen) days.   . Continuous Blood Gluc Sensor (FREESTYLE LIBRE 14 DAY SENSOR) MISC USE TO CHECK BLOOD SUGAR AS DIRECTED. CHANGE EVERY 14 DAYS.   Marland Kitchen ezetimibe (ZETIA) 10 MG tablet TAKE 1 TABLET BY MOUTH ONCE DAILY   . furosemide (LASIX) 40 MG tablet Take 1 tablet (40 mg total) by mouth 2 (two) times daily. Can take a 2nd daily dose as needed. 06/29/2020: Reports takes 40 mg every day and an additional 40 mg if needed.  . insulin NPH-regular Human (NOVOLIN 70/30) (70-30) 100 UNIT/ML injection Inject 60 units in the morning and 35 units at bedtime. (Patient taking differently: Per patient report: he is Injecting 70 units in the morning and 50 units at  bedtime.) 05/06/2020: Client reports he is taking 70 units in the morning and 40 units in the evening.  . isosorbide mononitrate (IMDUR) 30 MG 24 hr tablet TAKE 1 TABLET BY MOUTH TWICE DAILY   . losartan (COZAAR) 50 MG tablet Take 1 tablet (50 mg total) by mouth daily.   . nitroGLYCERIN (NITROSTAT) 0.4 MG SL tablet DISSOLVE 1 TABLET UNDER TONGUE AS NEEDEDFOR CHEST PAIN. MAY REPEAT 5 MINUTES APART 3 TIMES IF NEEDED   . potassium chloride SA (K-DUR) 20 MEQ tablet Take 1 tablet (20 mEq total) by mouth daily.   . rosuvastatin (CRESTOR) 40 MG tablet Take 40 mg by mouth 3 (three) times a week.   . venlafaxine XR (EFFEXOR-XR) 150 MG 24 hr capsule TAKE 1 CAPSULE BY MOUTH ONCE DAILY WITH BREAKFAST. TAKE WITH 75 MG DOSE TO EQUAL 225MG  DAILY   . venlafaxine XR (EFFEXOR-XR) 75 MG 24 hr capsule TAKE 1 CAPSULE BY MOUTH ONCE DAILY. TAKEIN ADDITION TO THE 150 MG CAPSULE TO EQUAL A TOTAL DOSE OF 225 MG DAILY    No facility-administered encounter medications on file as of 08/13/2020.    Current Diagnosis: Patient Active Problem List   Diagnosis Date Noted  . Depression   . Chest pain 03/31/2020  . Hyperglycemia due to type 2 diabetes mellitus (HCC) 03/31/2020  . Acute on chronic diastolic (congestive) heart failure (HCC) 03/31/2020  . Tick bite  of abdominal wall 04/10/2018  . Tinea corporis 04/10/2018  . Fatigue 03/06/2018  . ED (erectile dysfunction) 03/06/2018  . Dermatitis 01/16/2018  . Tinea pedis of both feet 01/16/2018  . Acute on chronic diastolic CHF (congestive heart failure) (HCC) 09/20/2016  . Adjustment disorder with mixed anxiety and depressed mood 09/09/2016  . Gastroesophageal reflux disease 09/09/2016  . Coronary artery disease, non-occlusive   . Elevated left ventricular end-diastolic pressure (LVEDP) 03/11/2016  . Leg swelling 03/11/2016  . Anemia 03/11/2016  . Hypertensive heart disease with heart failure (HCC)   . 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)   .  Coronary artery disease of native artery of native heart with stable angina pectoris (HCC)   . Morbid obesity (HCC)   . Chronic chest pain   . NSTEMI (non-ST elevated myocardial infarction) (HCC)   . Advanced care planning/counseling discussion 11/20/2015  . Noncompliance with diabetes treatment 11/05/2015  . Noncompliance with diet and medication regimen 11/05/2015  . Diabetic retinopathy (HCC) 09/23/2014  . Snoring 12/21/2012  . HYPOGONADISM 09/28/2010  . B12 deficiency 09/28/2010  . Vitamin D deficiency 09/28/2010  . OTHER MALAISE AND FATIGUE 09/17/2010  . TRIGGER FINGER, RIGHT MIDDLE 09/14/2010  . BRANCH RETINAL VEIN OCCLUSION 11/26/2009  . Leg pain 10/06/2009  . CONSTIPATION 08/10/2009  . GASTRITIS 07/29/2009  . Major depressive disorder, recurrent episode, moderate (HCC) 07/06/2007  . ERECTILE DYSFUNCTION 04/10/2007  . RENAL CALCULUS, HX OF 03/26/2007  . Hyperlipidemia associated with type 2 diabetes mellitus (HCC) 12/05/2006  . Hypertension associated with diabetes (HCC) 12/05/2006  . Osteoarthritis 12/05/2006    Goals Addressed   None     Follow-Up:  Pharmacist Review   [name], CPP notified  [name], [facility] Clinical Pharmacy Assistant [phone]

## 2020-09-01 ENCOUNTER — Other Ambulatory Visit: Payer: Self-pay

## 2020-09-02 ENCOUNTER — Telehealth: Payer: Self-pay

## 2020-09-02 ENCOUNTER — Ambulatory Visit: Payer: HMO

## 2020-09-02 ENCOUNTER — Other Ambulatory Visit: Payer: Self-pay

## 2020-09-02 DIAGNOSIS — I152 Hypertension secondary to endocrine disorders: Secondary | ICD-10-CM

## 2020-09-02 DIAGNOSIS — E1139 Type 2 diabetes mellitus with other diabetic ophthalmic complication: Secondary | ICD-10-CM

## 2020-09-02 DIAGNOSIS — E1159 Type 2 diabetes mellitus with other circulatory complications: Secondary | ICD-10-CM

## 2020-09-02 DIAGNOSIS — IMO0002 Reserved for concepts with insufficient information to code with codable children: Secondary | ICD-10-CM

## 2020-09-02 DIAGNOSIS — E1169 Type 2 diabetes mellitus with other specified complication: Secondary | ICD-10-CM

## 2020-09-02 NOTE — Telephone Encounter (Signed)
PCP consult regarding CCM 09/02/20:  Pt is due for renewal of his Trulicity application through [employer]. He is currently on the 1.5 mg weekly dose (verified per patient and application sent 06/25/20 per media tab). He is coming in tomorrow to complete paperwork and is interested in increasing his Trulicity dose to 3 mg weekly.   Denies any hypoglycemia since last visit with PCP. He started eating a bedtime snack and reduced his evening meal Novolin from 40 to 35 units.  He is checking his BG 2 times daily before breakfast and 2 hours after evening meal.  Reports the following BG readings:  Fasting BG 80-120  2 hours after evening meal BG 200-250  Recommend increasing Trulicity to 3 mg weekly. Decrease evening Novolin to 28 units (20% decrease) to ensure no hypoglycemia. If you agree the application will be updated with new dose of Trulicity 3 mg for your signature. Pt will be educated on insulin dose decrease.   [name], PharmD Clinical Pharmacist [facility] at [facility] [phone]

## 2020-09-02 NOTE — Chronic Care Management (AMB) (Signed)
Chronic Care Management Pharmacy  Name: [name]  MRN: [id number] DOB: [birth year]  Chief Complaint/ HPI  [name],  70 y.o., male presents for their Follow-Up CCM visit with the clinical pharmacist via telephone.  PCP : [name], MD  Their chronic conditions addressed today include: depression, diabetes, hypertension, CAD, hyperlipidemia   Office Visits:  08/07/20: PCP visit - Continue current medications   05/23/20: Flu shot    04/21/20 : PCP visit -  Patient having morning low blood sugars with Insulin 70/30 80 units in the morning and 40 units in the evening. Daughter is helping him complete Trulicity PAP form. Try Crestor 1/2 tablet three times weekly. Resume Trulicity 1.5 mg once arrives  01/21/20: PCP visit - DM on insulin 70/30 70 units AM, 40-50 units at night, denies taking Trulicity, uses libre on and off, A1c 10%, restart Trulicity at 1.5 mg daily; pt stopped Crestor due to arm and should pain, try 1/2 tablet daily with CoQ10, HTN controlled  11/27/19: CCM visit - assist with adherence/bubble packs through [facility] pharmacy, increase Trulicity to 1.5 (pt has not yet increased)  10/15/19: PCP visit - out of Trulicity for 1 month, increase Trulicity to 1.5 mg weekly, restart Crestor, (off antifungal), purchase BP cuff and monitor at home  Consult Visit:   05/06/2020 [facility] Nurse visit - Patient having affordability issues with Trulicity. Discrepancy on med list. Did not attend heart failure clinic appointment post-hospital.   04/03/2020 [facility] Nurse - Patient was hospitalized for heart failure 08/03-08/05.   12/24/19: Cardiology - 12 month follow up, back on statin, thinks he is retaining fluid, takes Lasix 40 mg daily and extra tablet PRN. GERD, stable on PPI, HTN controlled, no med changes, RTC 6 months   Allergies  Allergen Reactions  . Atorvastatin Other (See Comments)    Body aches   Medications: Outpatient Encounter Medications as of 09/02/2020   Medication Sig Note  . aspirin 81 MG EC tablet Take 1 tablet (81 mg total) by mouth daily.   . carvedilol (COREG) 6.25 MG tablet TAKE 1 TABLET BY MOUTH ONCE DAILY   . Continuous Blood Gluc Sensor (FREESTYLE LIBRE 14 DAY SENSOR) MISC USE TO CHECK BLOOD SUGAR AS DIRECTED. CHANGE EVERY 14 DAYS.   . Dulaglutide (TRULICITY) 1.5 MG/0.5ML SOPN Inject 1.5 mg into the skin. Inject 1.5 mg once weekly (patient receives from [facility])   . ezetimibe (ZETIA) 10 MG tablet TAKE 1 TABLET BY MOUTH ONCE DAILY   . losartan (COZAAR) 50 MG tablet Take 1 tablet (50 mg total) by mouth daily.   . rosuvastatin (CRESTOR) 40 MG tablet Take 40 mg by mouth 3 (three) times a week.   . venlafaxine XR (EFFEXOR-XR) 150 MG 24 hr capsule TAKE 1 CAPSULE BY MOUTH ONCE DAILY WITH BREAKFAST. TAKE WITH 75 MG DOSE TO EQUAL 225MG  DAILY   . venlafaxine XR (EFFEXOR-XR) 75 MG 24 hr capsule TAKE 1 CAPSULE BY MOUTH ONCE DAILY. TAKEIN ADDITION TO THE 150 MG CAPSULE TO EQUAL A TOTAL DOSE OF 225 MG DAILY   . cholecalciferol (VITAMIN D3) 25 MCG (1000 UNIT) tablet Take 2,000 Units by mouth daily.   . clopidogrel (PLAVIX) 75 MG tablet TAKE 1 TABLET BY MOUTH ONCE DAILY WITH BREAKFAST   . Continuous Blood Gluc Receiver (FREESTYLE LIBRE 14 DAY READER) DEVI 1 Device by Does not apply route every 14 (fourteen) days.   . furosemide (LASIX) 40 MG tablet Take 1 tablet (40 mg total) by mouth  2 (two) times daily. Can take a 2nd daily dose as needed. 06/29/2020: Reports takes 40 mg every day and an additional 40 mg if needed.  . insulin NPH-regular Human (NOVOLIN 70/30) (70-30) 100 UNIT/ML injection Inject 60 units in the morning and 35 units at bedtime. (Patient taking differently: Inject 70 units in the morning and 35 units at bedtime.)   . isosorbide mononitrate (IMDUR) 30 MG 24 hr tablet TAKE 1 TABLET BY MOUTH TWICE DAILY   . nitroGLYCERIN (NITROSTAT) 0.4 MG SL tablet DISSOLVE 1 TABLET UNDER TONGUE AS NEEDEDFOR CHEST PAIN. MAY REPEAT 5 MINUTES APART 3 TIMES  IF NEEDED   . potassium chloride SA (K-DUR) 20 MEQ tablet Take 1 tablet (20 mEq total) by mouth daily.    No facility-administered encounter medications on file as of 09/02/2020.   Current Diagnosis/Assessment:  SDOH Interventions   Flowsheet Row Most Recent Value  SDOH Interventions   Financial Strain Interventions Other (Comment)  [Manufacturer assistance program - Trulicity]      Goals    . Pharmacy Care Plan     CARE PLAN ENTRY (see longitudinal plan of care for additional care plan information)  Current Barriers:  . Chronic Disease Management support, education, and care coordination needs related to Hyperlipidemia, Diabetes, and Depression  Hyperlipidemia Lab Results  Component Value Date/Time   LDLCALC 52 07/31/2020 08:41 AM   LDLDIRECT 174.0 10/19/2017 04:00 PM .  Pharmacist Clinical Goal(s): o Over the next 3 months, patient will work with PharmD and providers to achieve LDL goal < 70 . Current regimen:  o Ezetimibe 10 mg - 1 tablet daily  o Rosuvastatin 40 mg -1 tablet three times weekly  . Interventions: o Continue rosuvastatin 40 mg - 1 tablet three times weekly.  . Patient self care activities - Over the next 3 months, patient will: o Continue rosuvastatin 40 mg - 1 tablet three times weekly.  Diabetes Lab Results  Component Value Date/Time   HGBA1C 8.6 (H) 07/31/2020 08:41 AM   HGBA1C 11.1 (H) 04/14/2020 08:34 AM   HGBA1C 9.6 10/02/2018 12:00 AM .  Pharmacist Clinical Goal(s): o Over the next 3 months, patient will work with PharmD and providers to achieve A1c goal <7% . Current regimen:  o Novolin 70/30 - Inject 70 units in the morning and 35 units at evening meal (30 minutes before meals) o Trulicity 1.5 mg - Inject once weekly  . Interventions: o Coordinate Trulicity application renewal from [facility] PCP to consider Trulicity dose increase . Patient self care activities - Over the next 3 months, patient will: o Contact provider with any  episodes of hypoglycemia o Come into office to sign Trulicity application on 03/29/18 o Take your insulin 30 minutes before you eat instead of after meals due to prevent low blood sugars  Depression . Pharmacist Clinical Goal(s) o Over the next 3 months, patient will work with PharmD and providers to improve depressed mood . Current regimen:  o Venlafaxine XR 75 mg and 150 mg - take total dose of 225 mg once daily . Interventions: o Discussed mood with patient and he is doing much better getting out of the house daily to see friends . Patient self care activities - Over the next 3 months, patient will: o Continue current medications  Please see past updates related to this goal by clicking on the "Past Updates" button in the selected goal        Diabetes  Recent Relevant Labs: Lab Results  Component  Value Date/Time   HGBA1C 8.6 (H) 07/31/2020 08:41 AM   HGBA1C 11.1 (H) 04/14/2020 08:34 AM   HGBA1C 9.6 10/02/2018 12:00 AM   MICROALBUR <0.7 02/21/2017 01:57 PM   MICROALBUR 0.8 02/16/2016 08:24 AM   Last saw PCP 08/07/20 - "if AM lows reduce Novolin"  Patient is currently uncontrolled on the following medications:   Novolin 70/30 - Inject 70 units in the morning and 35 units at evening meal (dose confirmed per patient 09/02/20)  Trulicity 1.5 mg - Inject once weekly (dose confirmed per patient 09/02/20)   Update 09/02/20:  Checking BG 2-3 times daily with Josephine Igo, last one came off about 8 days early but they have been doing better  AM 80-120 (before breakfast)  Evening 200-250 (2 hours after evening meal)  A1c much improved now that he is on Trulicity. Reports he has been on Trulicity 1.5 mg since early Nov 2021 (confirmed by [facility] application). Previously reported having some hypoglycemia episodes overnight. Denies hypoglycemia since PCP visit 08/07/20. Eating a bedtime snack and reduced his Novolin before supper from 40 to 35 units. Lowest BG - 80 mg/dL.  Taking insulin before  meals but occasionally after meals - encouraged again to take 30 min before meals.  Watching what he eats, feeling a lot better now that he is not drinking 15-20 2 liters a week. Getting out the house 2-3 times a day with friends. Appetite is down with Trulicity, tolerating well, getting free through manufacturer program. Needs renewal of application for 2022.  Pt has enough Trulicity to last until end of January. Interested in increasing Trulicity to further improve BG control.   Plan: Continue current medications; Consult PCP to consider increasing Trulicity to 3 mg. Reduce evening Novolin to 28 units to prevent hypoglycemia in the AM with Trulicity increase. Coordinate application renewal for 2022  - Pt will come in tomorrow 12-1 PM to sign forms. Take Novolin before meals.   Hyperlipidemia  Lipid Panel     Component Value Date/Time   CHOL 107 07/31/2020 0841   TRIG 102.0 07/31/2020 0841   HDL 34.40 (L) 07/31/2020 0841   CHOLHDL 3 07/31/2020 0841   VLDL 20.4 07/31/2020 0841   LDLCALC 52 07/31/2020 0841   LDLDIRECT 174.0 10/19/2017 1600   LDL goal < 70  Patient has failed these meds in past: atorvastatin - joint pain Patient is currently uncontrolled on the following medications:   Rosuvastatin 40 mg - 1 tablet MWF    Ezetimibe 10 mg - 1 tablet daily   Update 09/02/20: Taking statin three days week due to joint pain with higher dose. Tolerating well. LDL now within goal.   Plan: Continue current medications  Hypertension   CMP Latest Ref Rng & Units 07/31/2020 04/14/2020 04/02/2020  Glucose 70 - 99 mg/dL 616(W) 737(T) 062(I)  BUN 6 - 23 mg/dL 19 17 94(W)  Creatinine 0.40 - 1.50 mg/dL 5.46 2.70 3.50  Sodium 135 - 145 mEq/L 142 139 138  Potassium 3.5 - 5.1 mEq/L 4.4 4.0 4.4  Chloride 96 - 112 mEq/L 103 100 99  CO2 19 - 32 mEq/L 31 31 30   Calcium 8.4 - 10.5 mg/dL 9.1 9.4 8.9  Total Protein 6.0 - 8.3 g/dL 6.7 7.2 -  Total Bilirubin 0.2 - 1.2 mg/dL 0.4 0.3 -  Alkaline Phos 39 - 117  U/L 85 85 -  AST 0 - 37 U/L 12 16 -  ALT 0 - 53 U/L 12 19 -   Office blood pressures are:  BP Readings from Last 3 Encounters:  04/02/20 (!) 137/50  01/21/20 138/60  12/24/19 140/64   Patient has failed these meds in the past: none reported Patient is currently uncontrolled on the following medications:   Carvedilol 6.25 mg - 1 tablet daily  Losartan 50 mg - 1 tablet daily   Update 09/02/20: Pt reports HTA called and they are checking on getting him a BP monitor. Office visit BP is within goal. DBP low at last check. Patient's adherence is 90-99% on losartan per fill history for 2021. May be prudent to check home BP to ensure within goal.  Plan: Continue current medications.   MDD   PHQ-9 (11/27/19): 15 moderately severe depression  Patient has failed these meds in past: multiple  Patient is currently controlled on the following medications:   Venlafaxine XR 75 mg and 150 mg - take total dose of 225 mg once daily  Tylenol PM - PRN for sleep   Update 09/02/20: Pt reports doing much better now. Starting to feel better about wife passing after the 4 year anniversary last month. He is going out 2-3 times a day with friends. Much happier about life and feeling much better.   Plan: Continue current medications  CCM Follow Up: 6 weeks 10/15/2020 at 1 PM (phone visit)  [name], PharmD Clinical Pharmacist  Primary Care at [facility] [phone]

## 2020-09-02 NOTE — Chronic Care Management (AMB) (Signed)
Chronic Care Management Pharmacy Assistant   Name: [name]  MRN: [id number] DOB: 1951-03-21  Reason for Encounter: 2022 Trulicity patient assistance application.   Patient Questions:  1.  Have you seen any other providers since your last visit? No  2.  Any changes in your medicines or health? No    PCP : [name], MD  Allergies:   Allergies  Allergen Reactions  . Atorvastatin Other (See Comments)    Body aches    Medications: Outpatient Encounter Medications as of 09/02/2020  Medication Sig Note  . aspirin 81 MG EC tablet Take 1 tablet (81 mg total) by mouth daily.   . carvedilol (COREG) 6.25 MG tablet TAKE 1 TABLET BY MOUTH ONCE DAILY   . cholecalciferol (VITAMIN D3) 25 MCG (1000 UNIT) tablet Take 2,000 Units by mouth daily.   . clopidogrel (PLAVIX) 75 MG tablet TAKE 1 TABLET BY MOUTH ONCE DAILY WITH BREAKFAST   . Continuous Blood Gluc Receiver (FREESTYLE LIBRE 14 DAY READER) DEVI 1 Device by Does not apply route every 14 (fourteen) days.   . Continuous Blood Gluc Sensor (FREESTYLE LIBRE 14 DAY SENSOR) MISC USE TO CHECK BLOOD SUGAR AS DIRECTED. CHANGE EVERY 14 DAYS.   Marland Kitchen ezetimibe (ZETIA) 10 MG tablet TAKE 1 TABLET BY MOUTH ONCE DAILY   . furosemide (LASIX) 40 MG tablet Take 1 tablet (40 mg total) by mouth 2 (two) times daily. Can take a 2nd daily dose as needed. 06/29/2020: Reports takes 40 mg every day and an additional 40 mg if needed.  . insulin NPH-regular Human (NOVOLIN 70/30) (70-30) 100 UNIT/ML injection Inject 60 units in the morning and 35 units at bedtime. (Patient taking differently: Per patient report: he is Injecting 70 units in the morning and 50 units at bedtime.) 05/06/2020: Client reports he is taking 70 units in the morning and 40 units in the evening.  . isosorbide mononitrate (IMDUR) 30 MG 24 hr tablet TAKE 1 TABLET BY MOUTH TWICE DAILY   . losartan (COZAAR) 50 MG tablet Take 1 tablet (50 mg total) by mouth daily.   . nitroGLYCERIN (NITROSTAT) 0.4 MG  SL tablet DISSOLVE 1 TABLET UNDER TONGUE AS NEEDEDFOR CHEST PAIN. MAY REPEAT 5 MINUTES APART 3 TIMES IF NEEDED   . potassium chloride SA (K-DUR) 20 MEQ tablet Take 1 tablet (20 mEq total) by mouth daily.   . rosuvastatin (CRESTOR) 40 MG tablet Take 40 mg by mouth 3 (three) times a week.   . venlafaxine XR (EFFEXOR-XR) 150 MG 24 hr capsule TAKE 1 CAPSULE BY MOUTH ONCE DAILY WITH BREAKFAST. TAKE WITH 75 MG DOSE TO EQUAL 225MG  DAILY   . venlafaxine XR (EFFEXOR-XR) 75 MG 24 hr capsule TAKE 1 CAPSULE BY MOUTH ONCE DAILY. TAKEIN ADDITION TO THE 150 MG CAPSULE TO EQUAL A TOTAL DOSE OF 225 MG DAILY    No facility-administered encounter medications on file as of 09/02/2020.    Current Diagnosis: Patient Active Problem List   Diagnosis Date Noted  . Depression   . Chest pain 03/31/2020  . Hyperglycemia due to type 2 diabetes mellitus (HCC) 03/31/2020  . Acute on chronic diastolic (congestive) heart failure (HCC) 03/31/2020  . Tick bite of abdominal wall 04/10/2018  . Tinea corporis 04/10/2018  . Fatigue 03/06/2018  . ED (erectile dysfunction) 03/06/2018  . Dermatitis 01/16/2018  . Tinea pedis of both feet 01/16/2018  . Acute on chronic diastolic CHF (congestive heart failure) (HCC) 09/20/2016  . Adjustment disorder with mixed anxiety and  depressed mood 09/09/2016  . Gastroesophageal reflux disease 09/09/2016  . Coronary artery disease, non-occlusive   . Elevated left ventricular end-diastolic pressure (LVEDP) 03/11/2016  . Leg swelling 03/11/2016  . Anemia 03/11/2016  . Hypertensive heart disease with heart failure (HCC)   . 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)   . Coronary artery disease of native artery of native heart with stable angina pectoris (HCC)   . Morbid obesity (HCC)   . Chronic chest pain   . NSTEMI (non-ST elevated myocardial infarction) (HCC)   . Advanced care planning/counseling discussion 11/20/2015  . Noncompliance with diabetes treatment 11/05/2015  .  Noncompliance with diet and medication regimen 11/05/2015  . Diabetic retinopathy (HCC) 09/23/2014  . Snoring 12/21/2012  . HYPOGONADISM 09/28/2010  . B12 deficiency 09/28/2010  . Vitamin D deficiency 09/28/2010  . OTHER MALAISE AND FATIGUE 09/17/2010  . TRIGGER FINGER, RIGHT MIDDLE 09/14/2010  . BRANCH RETINAL VEIN OCCLUSION 11/26/2009  . Leg pain 10/06/2009  . CONSTIPATION 08/10/2009  . GASTRITIS 07/29/2009  . Major depressive disorder, recurrent episode, moderate (HCC) 07/06/2007  . ERECTILE DYSFUNCTION 04/10/2007  . RENAL CALCULUS, HX OF 03/26/2007  . Hyperlipidemia associated with type 2 diabetes mellitus (HCC) 12/05/2006  . Hypertension associated with diabetes (HCC) 12/05/2006  . Osteoarthritis 12/05/2006   2022 Trulicity patient assistance application has been completed on behalf of the patient. Patient is aware he will need to come in to Dr. Daphine Deutscher office to sign form and provide income information.     Follow-Up:  Patient Assistance Coordination and Pharmacist Review   [name], CPP notified  [name], [facility] Clinical Pharmacy Assistant [phone]

## 2020-09-02 NOTE — Patient Instructions (Addendum)
Dear [name],  Below is a summary of the goals we discussed during our follow up appointment on September 02, 2020. Please contact me anytime with questions or concerns.   Visit Information  Goals Addressed            This Visit's Progress   . Pharmacy Care Plan       CARE PLAN ENTRY (see longitudinal plan of care for additional care plan information)  Current Barriers:  . Chronic Disease Management support, education, and care coordination needs related to Hyperlipidemia, Diabetes, and Depression  Hyperlipidemia Lab Results  Component Value Date/Time   LDLCALC 52 07/31/2020 08:41 AM   LDLDIRECT 174.0 10/19/2017 04:00 PM .  Pharmacist Clinical Goal(s): o Over the next 3 months, patient will work with PharmD and providers to achieve LDL goal < 70 . Current regimen:  o Ezetimibe 10 mg - 1 tablet daily  o Rosuvastatin 40 mg -1 tablet three times weekly  . Interventions: o Continue rosuvastatin 40 mg - 1 tablet three times weekly.  . Patient self care activities - Over the next 3 months, patient will: o Continue rosuvastatin 40 mg - 1 tablet three times weekly.  Diabetes Lab Results  Component Value Date/Time   HGBA1C 8.6 (H) 07/31/2020 08:41 AM   HGBA1C 11.1 (H) 04/14/2020 08:34 AM   HGBA1C 9.6 10/02/2018 12:00 AM .  Pharmacist Clinical Goal(s): o Over the next 3 months, patient will work with PharmD and providers to achieve A1c goal <7% . Current regimen:  o Novolin 70/30 - Inject 70 units in the morning and 35 units at evening meal (30 minutes before meals) o Trulicity 1.5 mg - Inject once weekly  . Interventions: o Coordinate Trulicity application renewal from [employer] o Consult PCP to consider Trulicity dose increase . Patient self care activities - Over the next 3 months, patient will: o Contact provider with any episodes of hypoglycemia o Come into office to sign Trulicity application on 09/04/19 o Take your insulin 30 minutes before you eat instead of after  meals due to prevent low blood sugars  Depression . Pharmacist Clinical Goal(s) o Over the next 3 months, patient will work with PharmD and providers to improve depressed mood . Current regimen:  o Venlafaxine XR 75 mg and 150 mg - take total dose of 225 mg once daily . Interventions: o Discussed mood with patient and he is doing much better getting out of the house daily to see friends . Patient self care activities - Over the next 3 months, patient will: o Continue current medications  Please see past updates related to this goal by clicking on the "Past Updates" button in the selected goal         The patient verbalized understanding of instructions, educational materials, and care plan provided today and agreed to receive a mailed copy of patient instructions, educational materials, and care plan.   Telephone follow up appointment with pharmacy team member scheduled for: 10/15/2020 at 1 PM (phone visit)  [name], PharmD Clinical Pharmacist [facility] at [facility] [phone]

## 2020-09-03 ENCOUNTER — Telehealth: Payer: Self-pay

## 2020-09-03 NOTE — Telephone Encounter (Signed)
Patient came into office to sign application for Trulicity patient assistance today 09/03/20. Application was placed in Dr. Daphine Deutscher box for signatures.  [name], PharmD Clinical Pharmacist [facility] at [facility] [phone]

## 2020-09-21 ENCOUNTER — Other Ambulatory Visit: Payer: Self-pay | Admitting: Family Medicine

## 2020-09-24 NOTE — Telephone Encounter (Signed)
Dr. Ermalene Searing approved Trulicity dose increase to 3 mg and sent new prescription to [facility]. Will tell patient to monitor BG and if any < 70, please call. Continue insulin dose the same for now.

## 2020-09-25 ENCOUNTER — Telehealth: Payer: Self-pay

## 2020-09-25 NOTE — Chronic Care Management (AMB) (Addendum)
Chronic Care Management Pharmacy Assistant   Name: [name]  MRN: [id number] DOB: [birth year]  Reason for Encounter: Trulicity patient assistance approval update  Patient Questions:  1.  Have you seen any other providers since your last visit? No  2.  Any changes in your medicines or health? No    PCP : William Seltzer, MD  Allergies:   Allergies  Allergen Reactions   Atorvastatin Other (See Comments)    Body aches    Medications: Outpatient Encounter Medications as of 09/25/2020  Medication Sig Note   aspirin 81 MG EC tablet Take 1 tablet (81 mg total) by mouth daily.    carvedilol (COREG) 6.25 MG tablet TAKE 1 TABLET BY MOUTH ONCE DAILY    cholecalciferol (VITAMIN D3) 25 MCG (1000 UNIT) tablet Take 2,000 Units by mouth daily.    clopidogrel (PLAVIX) 75 MG tablet TAKE 1 TABLET BY MOUTH ONCE DAILY WITH BREAKFAST    Continuous Blood Gluc Receiver (FREESTYLE LIBRE 14 DAY READER) DEVI 1 Device by Does not apply route every 14 (fourteen) days.    Continuous Blood Gluc Sensor (FREESTYLE LIBRE 14 DAY SENSOR) MISC USE TO CHECK BLOOD SUGAR AS DIRECTED. CHANGE EVERY 14 DAYS.    Dulaglutide (TRULICITY) 1.5 MG/0.5ML SOPN Inject 1.5 mg into the skin. Inject 1.5 mg once weekly (patient receives from [employer])    ezetimibe (ZETIA) 10 MG tablet TAKE 1 TABLET BY MOUTH ONCE DAILY    furosemide (LASIX) 40 MG tablet Take 1 tablet (40 mg total) by mouth 2 (two) times daily. Can take a 2nd daily dose as needed. 06/29/2020: Reports takes 40 mg every day and an additional 40 mg if needed.   insulin NPH-regular Human (NOVOLIN 70/30) (70-30) 100 UNIT/ML injection Inject 70 units in the morning and 35 units at bedtime.    isosorbide mononitrate (IMDUR) 30 MG 24 hr tablet TAKE 1 TABLET BY MOUTH TWICE DAILY    losartan (COZAAR) 50 MG tablet Take 1 tablet (50 mg total) by mouth daily.    nitroGLYCERIN (NITROSTAT) 0.4 MG SL tablet DISSOLVE 1 TABLET UNDER TONGUE AS NEEDEDFOR CHEST PAIN. MAY REPEAT 5  MINUTES APART 3 TIMES IF NEEDED    potassium chloride SA (K-DUR) 20 MEQ tablet Take 1 tablet (20 mEq total) by mouth daily.    rosuvastatin (CRESTOR) 40 MG tablet Take 40 mg by mouth 3 (three) times a week.    venlafaxine XR (EFFEXOR-XR) 150 MG 24 hr capsule TAKE 1 CAPSULE BY MOUTH ONCE DAILY WITH BREAKFAST. TAKE WITH 75 MG DOSE TO EQUAL 225MG  DAILY    venlafaxine XR (EFFEXOR-XR) 75 MG 24 hr capsule TAKE 1 CAPSULE BY MOUTH ONCE DAILY. TAKEIN ADDITION TO THE 150 MG CAPSULE TO EQUAL A TOTAL DOSE OF 225 MG DAILY    No facility-administered encounter medications on file as of 09/25/2020.    Current Diagnosis: Patient Active Problem List   Diagnosis Date Noted   Depression    Chest pain 03/31/2020   Hyperglycemia due to type 2 diabetes mellitus (HCC) 03/31/2020   Acute on chronic diastolic (congestive) heart failure (HCC) 03/31/2020   Tick bite of abdominal wall 04/10/2018   Tinea corporis 04/10/2018   Fatigue 03/06/2018   ED (erectile dysfunction) 03/06/2018   Dermatitis 01/16/2018   Tinea pedis of both feet 01/16/2018   Acute on chronic diastolic CHF (congestive heart failure) (HCC) 09/20/2016   Adjustment disorder with mixed anxiety and depressed mood 09/09/2016   Gastroesophageal reflux disease 09/09/2016   Coronary artery  disease, non-occlusive    Elevated left ventricular end-diastolic pressure (LVEDP) 03/11/2016   Leg swelling 03/11/2016   Anemia 03/11/2016   Hypertensive heart disease with heart failure (HCC)    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)    Coronary artery disease of native artery of native heart with stable angina pectoris (HCC)    Morbid obesity (HCC)    Chronic chest pain    NSTEMI (non-ST elevated myocardial infarction) (HCC)    Advanced care planning/counseling discussion 11/20/2015   Noncompliance with diabetes treatment 11/05/2015   Noncompliance with diet and medication regimen 11/05/2015   Diabetic retinopathy (HCC) 09/23/2014    Snoring 12/21/2012   HYPOGONADISM 09/28/2010   B12 deficiency 09/28/2010   Vitamin D deficiency 09/28/2010   OTHER MALAISE AND FATIGUE 09/17/2010   TRIGGER FINGER, RIGHT MIDDLE 09/14/2010   BRANCH RETINAL VEIN OCCLUSION 11/26/2009   Leg pain 10/06/2009   CONSTIPATION 08/10/2009   GASTRITIS 07/29/2009   Major depressive disorder, recurrent episode, moderate (HCC) 07/06/2007   ERECTILE DYSFUNCTION 04/10/2007   RENAL CALCULUS, HX OF 03/26/2007   Hyperlipidemia associated with type 2 diabetes mellitus (HCC) 12/05/2006   Hypertension associated with diabetes (HCC) 12/05/2006   Osteoarthritis 12/05/2006   Contacted patient to explain that his application for Trulicity was approved and that Dr. Ermalene [name] approved Trulicity dose increase to 3 mg. Advised patient to alert [place] of any lows below 70.  Patient verbalized understanding. Contacted [name] on patients behalf to schedule delivery, they stated that since patient was on auto-refill a delivery was scheduled for today. Follow up date set up for 10/12/20 to review updated blood glucose readings after increasing to 3 mg.  Follow-Up:  Patient Assistance Coordination and Pharmacist Review   [name], CPP notified  [name], [facility] Clinical Pharmacy Assistant [phone]  I have reviewed the care management and care coordination activities outlined in this encounter and I am certifying that I agree with the content of this note. No further action required.  [name], PharmD Clinical Pharmacist [facility] at [facility] [phone]

## 2020-10-12 ENCOUNTER — Telehealth: Payer: Self-pay

## 2020-10-12 NOTE — Chronic Care Management (AMB) (Addendum)
Chronic Care Management Pharmacy Assistant   Name: [name]  MRN: [id number] DOB: Dec 24, 1950  Reason for Encounter: Disease State- Blood glucose log update  PCP : [name], MD  Allergies:   Allergies  Allergen Reactions   Atorvastatin Other (See Comments)    Body aches    Medications: Outpatient Encounter Medications as of 10/12/2020  Medication Sig Note   aspirin 81 MG EC tablet Take 1 tablet (81 mg total) by mouth daily.    carvedilol (COREG) 6.25 MG tablet TAKE 1 TABLET BY MOUTH ONCE DAILY    cholecalciferol (VITAMIN D3) 25 MCG (1000 UNIT) tablet Take 2,000 Units by mouth daily.    clopidogrel (PLAVIX) 75 MG tablet TAKE 1 TABLET BY MOUTH ONCE DAILY WITH BREAKFAST    Continuous Blood Gluc Receiver (FREESTYLE LIBRE 14 DAY READER) DEVI 1 Device by Does not apply route every 14 (fourteen) days.    Continuous Blood Gluc Sensor (FREESTYLE LIBRE 14 DAY SENSOR) MISC USE TO CHECK BLOOD SUGAR AS DIRECTED. CHANGE EVERY 14 DAYS.    Dulaglutide (TRULICITY) 1.5 MG/0.5ML SOPN Inject 1.5 mg into the skin. Inject 1.5 mg once weekly (patient receives from [employer])    ezetimibe (ZETIA) 10 MG tablet TAKE 1 TABLET BY MOUTH ONCE DAILY    furosemide (LASIX) 40 MG tablet Take 1 tablet (40 mg total) by mouth 2 (two) times daily. Can take a 2nd daily dose as needed. 06/29/2020: Reports takes 40 mg every day and an additional 40 mg if needed.   insulin NPH-regular Human (NOVOLIN 70/30) (70-30) 100 UNIT/ML injection Inject 70 units in the morning and 35 units at bedtime.    isosorbide mononitrate (IMDUR) 30 MG 24 hr tablet TAKE 1 TABLET BY MOUTH TWICE DAILY    losartan (COZAAR) 50 MG tablet Take 1 tablet (50 mg total) by mouth daily.    nitroGLYCERIN (NITROSTAT) 0.4 MG SL tablet DISSOLVE 1 TABLET UNDER TONGUE AS NEEDEDFOR CHEST PAIN. MAY REPEAT 5 MINUTES APART 3 TIMES IF NEEDED    potassium chloride SA (K-DUR) 20 MEQ tablet Take 1 tablet (20 mEq total) by mouth daily.    rosuvastatin (CRESTOR)  40 MG tablet Take 40 mg by mouth 3 (three) times a week.    venlafaxine XR (EFFEXOR-XR) 150 MG 24 hr capsule TAKE 1 CAPSULE BY MOUTH ONCE DAILY WITH BREAKFAST. TAKE WITH 75 MG DOSE TO EQUAL 225MG  DAILY    venlafaxine XR (EFFEXOR-XR) 75 MG 24 hr capsule TAKE 1 CAPSULE BY MOUTH ONCE DAILY. TAKEIN ADDITION TO THE 150 MG CAPSULE TO EQUAL A TOTAL DOSE OF 225 MG DAILY    No facility-administered encounter medications on file as of 10/12/2020.    Current Diagnosis: Patient Active Problem List   Diagnosis Date Noted   Depression    Chest pain 03/31/2020   Hyperglycemia due to type 2 diabetes mellitus (HCC) 03/31/2020   Acute on chronic diastolic (congestive) heart failure (HCC) 03/31/2020   Tick bite of abdominal wall 04/10/2018   Tinea corporis 04/10/2018   Fatigue 03/06/2018   ED (erectile dysfunction) 03/06/2018   Dermatitis 01/16/2018   Tinea pedis of both feet 01/16/2018   Acute on chronic diastolic CHF (congestive heart failure) (HCC) 09/20/2016   Adjustment disorder with mixed anxiety and depressed mood 09/09/2016   Gastroesophageal reflux disease 09/09/2016   Coronary artery disease, non-occlusive    Elevated left ventricular end-diastolic pressure (LVEDP) 03/11/2016   Leg swelling 03/11/2016   Anemia 03/11/2016   Hypertensive heart disease with heart failure (  HCC)    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)    Coronary artery disease of native artery of native heart with stable angina pectoris (HCC)    Morbid obesity (HCC)    Chronic chest pain    NSTEMI (non-ST elevated myocardial infarction) (HCC)    Advanced care planning/counseling discussion 11/20/2015   Noncompliance with diabetes treatment 11/05/2015   Noncompliance with diet and medication regimen 11/05/2015   Diabetic retinopathy (HCC) 09/23/2014   Snoring 12/21/2012   HYPOGONADISM 09/28/2010   B12 deficiency 09/28/2010   Vitamin D deficiency 09/28/2010   OTHER MALAISE AND FATIGUE 09/17/2010    TRIGGER FINGER, RIGHT MIDDLE 09/14/2010   BRANCH RETINAL VEIN OCCLUSION 11/26/2009   Leg pain 10/06/2009   CONSTIPATION 08/10/2009   GASTRITIS 07/29/2009   Major depressive disorder, recurrent episode, moderate (HCC) 07/06/2007   ERECTILE DYSFUNCTION 04/10/2007   RENAL CALCULUS, HX OF 03/26/2007   Hyperlipidemia associated with type 2 diabetes mellitus (HCC) 12/05/2006   Hypertension associated with diabetes (HCC) 12/05/2006   Osteoarthritis 12/05/2006    Recent Relevant Labs: Lab Results  Component Value Date/Time   HGBA1C 8.6 (H) 07/31/2020 08:41 AM   HGBA1C 11.1 (H) 04/14/2020 08:34 AM   HGBA1C 9.6 10/02/2018 12:00 AM   MICROALBUR <0.7 02/21/2017 01:57 PM   MICROALBUR 0.8 02/16/2016 08:24 AM    Kidney Function Lab Results  Component Value Date/Time   CREATININE 1.00 07/31/2020 08:41 AM   CREATININE 0.97 04/14/2020 08:34 AM   GFR 76.14 07/31/2020 08:41 AM   GFRNONAA >60 04/02/2020 06:17 AM   GFRAA >60 04/02/2020 06:17 AM    Current antihyperglycemic regimen:  Trulicity 3 mg- inject once weekly Novolin 70/30 - Inject 70 units in the morning and 35 units at evening meal (30 minutes before meals)  How often are you checking your blood sugar? before breakfast and at bedtime  What are your blood sugars ranging?   Fasting: Does not have dates for most readings 10/12/20- 111    76    80   110   140   52- felt shaky Before meals: N/A After meals:  10/11/20- 301 - 2 hours after icecream sandwich Bedtime: Ranges between 150-200  On insulin? Yes Novolin 70/30 How many units: Inject 70 units in the morning and 35-40 units at evening meal  Adherence Review: Is the patient currently on a STATIN medication? Yes Is the patient currently on ACE/ARB medication? Yes Does the patient have >5 day gap between last estimated fill dates? CPP to review  States he tries not to eat anything after 6:00 p.m. He also notes that he is low on energy and has constant back pain. He states  he would like pain medication but Dr. Ermalene Searing does not want to start him on any pain medication. He states he has no interest in going out and seeing people since his wife passed away. States he wants to be home away from everyone.   Follow-Up:  Pharmacist Review  [name], CPP notified  [name], [facility] Clinical Pharmacy Assistant [phone]  I have reviewed the care management and care coordination activities outlined in this encounter and I am certifying that I agree with the content of this note. Patient has visit with me 10/15/20. Will discuss at that time.  [name], PharmD Clinical Pharmacist [facility] at [facility] [phone]

## 2020-10-15 ENCOUNTER — Ambulatory Visit (INDEPENDENT_AMBULATORY_CARE_PROVIDER_SITE_OTHER): Payer: HMO

## 2020-10-15 DIAGNOSIS — E1139 Type 2 diabetes mellitus with other diabetic ophthalmic complication: Secondary | ICD-10-CM | POA: Diagnosis not present

## 2020-10-15 DIAGNOSIS — IMO0002 Reserved for concepts with insufficient information to code with codable children: Secondary | ICD-10-CM

## 2020-10-15 DIAGNOSIS — E1165 Type 2 diabetes mellitus with hyperglycemia: Secondary | ICD-10-CM

## 2020-10-15 DIAGNOSIS — F32A Depression, unspecified: Secondary | ICD-10-CM | POA: Diagnosis not present

## 2020-10-16 NOTE — Patient Instructions (Signed)
Dear [name],  Below is a summary of the goals we discussed during our follow up appointment on October 15, 2020. Please contact me anytime with questions or concerns.   Visit Information   Current Barriers:  . Hypoglycemia . Taking insulin after meal instead of before . Improper treatment of hypoglycemia  Pharmacist Clinical Goal(s):  Marland Kitchen Over the next 30 days, patient will achieve control of hypoglycemia as evidenced by no further blood glucose readings < 70  through collaboration with PharmD and provider.   Interventions: . 1:1 collaboration with [name], MD regarding development and update of comprehensive plan of care as evidenced by provider attestation and co-signature . Inter-disciplinary care team collaboration (see longitudinal plan of care) . Comprehensive medication review performed; medication list updated in electronic medical record  Diabetes (A1c goal <7%) -uncontrolled -Current medications: . Trulicity 3 mg - Inject once weekly  Novolin 70/30 - Inject 70 units in the morning and 35 units at evening meal (30 minutes before meals) -Medications previously tried:  metformin -Current home glucose readings - using libre, checking 10-15 times daily . fasting glucose: 80-110 . bedtime: 140-160 . Hypoglycemia: yes - reports waking up at night with readings 60-70, last night 45. Treated with a pack of peanut butter crackers. Reports BG came back to 110 after about an hour. - Adherence: Reports taking his insulin in the morning at 10 AM and 6 PM. Last night, he took his evening insulin after supper.  Takes 70 units in AM and 35 at supper time. Takes Trulicity 3 mg every Thursdays -Reports hypoglycemic symptoms. Denies hyperglycemic symptoms -Current meal patterns: reports eating a small supper (ham and cheese sandwich or grilled chicken salad) and does not eat after 6 PM most nights. -Educated on rule of 15. Recommended purchasing some glucose tablets. -Recommended  to continue current medication; Discussed he may need to reduce his evening Novolin by 3-5 units if continued nighttime lows. Recommend only taking insulin before meals, not after. Pick up some glucose tablets. Take 3-4 if any readings < 70. Patient will call if any BG < 70.  Depression/Anxiety (Goal: improve symptoms) -controlled -Current treatment: . Venlafaxine XR 75 and 150 mg daily  -Medications previously tried/failed: none reported Reports up and down days as far as mood. Confirms daily adherence to venlafaxine. Still seeing friends occasionally. Went to a birthday party last Saturday. He is concerned about COVID. Keeps his house clean and does yard work.  -Patient has follow up with PCP next month. He denies concerns today. -Recommended to continue current medication  Patient Goals/Self-Care Activities . Over the next 30 days, patient will:  - take medications as prescribed    The patient verbalized understanding of instructions, educational materials, and care plan provided today and declined offer to receive copy of patient instructions, educational materials, and care plan.  The pharmacy team will reach out to the patient again over the next 30 days.   [name], PharmD Clinical Pharmacist [facility] at [facility] [phone]

## 2020-10-16 NOTE — Progress Notes (Signed)
Chronic Care Management Pharmacy Note  10/16/2020 Name:  [name] MRN:  [id number] DOB:  [birth year]  Subjective: [name] is an 70 y.o. year old male who is a primary patient of Bedsole, Amy E, MD.  The CCM team was consulted for assistance with disease management and care coordination needs.    Engaged with patient by telephone for follow up visit in response to provider referral for pharmacy case management and/or care coordination services.   Consent to Services:  The patient was given information about Chronic Care Management services, agreed to services, and gave verbal consent prior to initiation of services.  Please see initial visit note for detailed documentation.   Patient Care Team: Jinny Sanders, MD as PCP - General Rockey Situ Kathlene November, MD as Consulting Physician (Cardiology) Debbora Dus, [facility] [place] as Pharmacist (Pharmacist)  Hospital visits: None in previous 6 months  Objective:  Lab Results  Component Value Date   CREATININE 1.00 07/31/2020   BUN 19 07/31/2020   GFR 76.14 07/31/2020   GFRNONAA >60 04/02/2020   GFRAA >60 04/02/2020   NA 142 07/31/2020   K 4.4 07/31/2020   CALCIUM 9.1 07/31/2020   CO2 31 07/31/2020    Lab Results  Component Value Date/Time   HGBA1C 8.6 (H) 07/31/2020 08:41 AM   HGBA1C 11.1 (H) 04/14/2020 08:34 AM   HGBA1C 9.6 10/02/2018 12:00 AM   GFR 76.14 07/31/2020 08:41 AM   GFR 76.42 04/14/2020 08:34 AM   MICROALBUR <0.7 02/21/2017 01:57 PM   MICROALBUR 0.8 02/16/2016 08:24 AM    Last diabetic Eye exam:  Lab Results  Component Value Date/Time   HMDIABEYEEXA Retinopathy (A) 12/11/2019 12:00 AM    Last diabetic Foot exam:  Lab Results  Component Value Date/Time   HMDIABFOOTEX done 03/19/2019 12:00 AM     Lab Results  Component Value Date   CHOL 107 07/31/2020   HDL 34.40 (L) 07/31/2020   LDLCALC 52 07/31/2020   LDLDIRECT 174.0 10/19/2017   TRIG 102.0 07/31/2020   CHOLHDL 3 07/31/2020    Hepatic Function Latest  Ref Rng & Units 07/31/2020 04/14/2020 03/31/2020  Total Protein 6.0 - 8.3 g/dL 6.7 7.2 7.4  Albumin 3.5 - 5.2 g/dL 3.9 4.0 3.9  AST 0 - 37 U/L 12 16 21   ALT 0 - 53 U/L 12 19 23   Alk Phosphatase 39 - 117 U/L 85 85 98  Total Bilirubin 0.2 - 1.2 mg/dL 0.4 0.3 0.5  Bilirubin, Direct 0.0 - 0.3 mg/dL - - -    Lab Results  Component Value Date/Time   TSH 2.64 10/19/2017 04:00 PM   TSH 1.14 03/11/2014 10:26 AM   FREET4 0.79 10/19/2017 04:00 PM    CBC Latest Ref Rng & Units 07/31/2020 03/31/2020 10/18/2018  WBC 4.0 - 10.5 K/uL 6.6 7.0 7.3  Hemoglobin 13.0 - 17.0 g/dL 13.2 13.4 13.5  Hematocrit 39.0 - 52.0 % 40.2 41.0 39.8  Platelets 150.0 - 400.0 K/uL 198.0 224 218.0    Lab Results  Component Value Date/Time   VD25OH 37.72 07/31/2020 08:41 AM   VD25OH 47.26 02/27/2018 09:26 AM    Clinical ASCVD: Yes  The ASCVD Risk score Mikey Bussing DC Jr., et al., 2013) failed to calculate for the following reasons:   The patient has a prior MI or stroke diagnosis    Depression screen [facility] 2/9 06/12/2020 01/30/2020 03/19/2019  Decreased Interest 3 3 3   Down, Depressed, Hopeless 3 3 3   PHQ - 2 Score 6 6 6  Altered sleeping 3 3 0  Tired, decreased energy 3 3 3   Change in appetite 2 0 0  Feeling bad or failure about yourself  0 0 1  Trouble concentrating 2 0 1  Moving slowly or fidgety/restless 2 0 1  Suicidal thoughts 0 0 0  PHQ-9 Score 18 12 12   Difficult doing work/chores Extremely dIfficult Extremely dIfficult -  Some recent data might be hidden    Social History   Tobacco Use  Smoking Status Never Smoker  Smokeless Tobacco Never Used   BP Readings from Last 3 Encounters:  04/02/20 (!) 137/50  01/21/20 138/60  12/24/19 140/64   Pulse Readings from Last 3 Encounters:  04/02/20 79  01/21/20 79  12/24/19 75   Wt Readings from Last 3 Encounters:  08/07/20 294 lb (133.4 kg)  04/21/20 291 lb (132 kg)  04/02/20 291 lb 4.8 oz (132.1 kg)    Assessment/Interventions: Review of patient past medical  history, allergies, medications, health status, including review of consultants reports, laboratory and other test data, was performed as part of comprehensive evaluation and provision of chronic care management services.   SDOH:  (Social Determinants of Health) assessments and interventions performed: Yes - Medications affordable    CCM Care Plan  Allergies  Allergen Reactions  . Atorvastatin Other (See Comments)    Body aches    Medications Reviewed Today    Reviewed by Debbora Dus, [facility] (Pharmacist) on 10/15/20 at 1326  Med List Status: <None>  Medication Order Taking? Sig Documenting Provider Last Dose Status Informant  aspirin 81 MG EC tablet [id number] Yes Take 1 tablet (81 mg total) by mouth daily. Theora Gianotti, NP Taking Active Self           Med Note Pilar Plate May 01, 2016  3:28 PM)    carvedilol (COREG) 6.25 MG tablet [id number] Yes TAKE 1 TABLET BY MOUTH ONCE DAILY Gollan, Kathlene November, MD Taking Active   cholecalciferol (VITAMIN D3) 25 MCG (1000 UNIT) tablet [id number] Yes Take 2,000 Units by mouth daily. [provider] Taking Active   clopidogrel (PLAVIX) 75 MG tablet [id number] Yes TAKE 1 TABLET BY MOUTH ONCE DAILY WITH BREAKFAST Bedsole, Amy E, MD Taking Active   Continuous Blood Gluc Receiver (FREESTYLE LIBRE 14 DAY READER) [place] [id number] Yes 1 Device by Does not apply route every 14 (fourteen) days. Jinny Sanders, MD Taking Active Self  Continuous Blood Gluc Sensor (FREESTYLE LIBRE 14 DAY SENSOR) [place] [id number] Yes USE TO CHECK BLOOD SUGAR AS DIRECTED. CHANGE EVERY 14 DAYS. Jinny Sanders, MD Taking Active   Dulaglutide (TRULICITY) 1.5 HT/0.9PJ SOPN [id number] Yes Inject 3 mg into the skin. Inject 3 mg once weekly (patient receives from [employer]) [provider] Taking Active Self  ezetimibe (ZETIA) 10 MG tablet [id number] Yes TAKE 1 TABLET BY MOUTH ONCE DAILY Gollan, Kathlene November, MD Taking Active   furosemide (LASIX) 40 MG tablet  [id number] Yes Take 1 tablet (40 mg total) by mouth 2 (two) times daily. Can take a 2nd daily dose as needed. Loletha Grayer, MD Taking Active            Med Note Juleen China, Gerre Couch Jun 29, 2020 12:21 PM) Reports takes 40 mg every day and an additional 40 mg if needed.  insulin NPH-regular Human (NOVOLIN 70/30) (70-30) 100 UNIT/ML injection [id number] Yes Inject 70 units in the morning and 35 units at bedtime. Jinny Sanders, MD Taking Active  isosorbide mononitrate (IMDUR) 30 MG 24 hr tablet [id number] Yes TAKE 1 TABLET BY MOUTH TWICE DAILY Gollan, Kathlene November, MD Taking Active   losartan (COZAAR) 50 MG tablet [id number] Yes Take 1 tablet (50 mg total) by mouth daily. Jinny Sanders, MD Taking Active Self  nitroGLYCERIN (NITROSTAT) 0.4 MG SL tablet [id number] Yes DISSOLVE 1 TABLET UNDER TONGUE AS NEEDEDFOR CHEST PAIN. MAY REPEAT 5 MINUTES APART 3 TIMES IF NEEDED Gollan, Kathlene November, MD Taking Active Self  potassium chloride SA (K-DUR) 20 MEQ tablet [id number] No Take 1 tablet (20 mEq total) by mouth daily.  Patient not taking: Reported on 10/15/2020   Jinny Sanders, MD Not Taking Active Self  rosuvastatin (CRESTOR) 40 MG tablet [id number] Yes Take 40 mg by mouth 3 (three) times a week. [provider] Taking Active   venlafaxine XR (EFFEXOR-XR) 150 MG 24 hr capsule [id number] Yes TAKE 1 CAPSULE BY MOUTH ONCE DAILY WITH BREAKFAST. TAKE WITH 75 MG DOSE TO EQUAL 225MG DAILY Bedsole, Amy E, MD Taking Active Self  venlafaxine XR (EFFEXOR-XR) 75 MG 24 hr capsule [id number] Yes TAKE 1 CAPSULE BY MOUTH ONCE DAILY. TAKEIN ADDITION TO THE 150 MG CAPSULE TO EQUAL A TOTAL DOSE OF 225 MG DAILY Bedsole, Amy E, MD Taking Active Self          Patient Active Problem List   Diagnosis Date Noted  . Depression   . Chest pain 03/31/2020  . Hyperglycemia due to type 2 diabetes mellitus ([place]) 03/31/2020  . Acute on chronic diastolic (congestive) heart failure ([place]) 03/31/2020  . Tick bite of abdominal wall  04/10/2018  . Tinea corporis 04/10/2018  . Fatigue 03/06/2018  . ED (erectile dysfunction) 03/06/2018  . Dermatitis 01/16/2018  . Tinea pedis of both feet 01/16/2018  . Acute on chronic diastolic CHF (congestive heart failure) ([place]) 09/20/2016  . Adjustment disorder with mixed anxiety and depressed mood 09/09/2016  . Gastroesophageal reflux disease 09/09/2016  . Coronary artery disease, non-occlusive   . Elevated left ventricular end-diastolic pressure (LVEDP) 03/11/2016  . Leg swelling 03/11/2016  . Anemia 03/11/2016  . Hypertensive heart disease with heart failure ([place])   . Uncontrolled type 2 diabetes with eye complications ([place])   . Coronary artery disease of native artery of native heart with stable angina pectoris ([place])   . Morbid obesity ([place])   . Chronic chest pain   . NSTEMI (non-ST elevated myocardial infarction) ([place])   . Advanced care planning/counseling discussion 11/20/2015  . Noncompliance with diabetes treatment 11/05/2015  . Noncompliance with diet and medication regimen 11/05/2015  . Diabetic retinopathy ([place]) 09/23/2014  . Snoring 12/21/2012  . HYPOGONADISM 09/28/2010  . B12 deficiency 09/28/2010  . Vitamin D deficiency 09/28/2010  . OTHER MALAISE AND FATIGUE 09/17/2010  . TRIGGER FINGER, RIGHT MIDDLE 09/14/2010  . [place] OCCLUSION 11/26/2009  . Leg pain 10/06/2009  . CONSTIPATION 08/10/2009  . GASTRITIS 07/29/2009  . Major depressive disorder, recurrent episode, moderate ([place]) 07/06/2007  . ERECTILE DYSFUNCTION 04/10/2007  . RENAL CALCULUS, HX OF 03/26/2007  . Hyperlipidemia associated with type 2 diabetes mellitus ([place]) 12/05/2006  . Hypertension associated with diabetes ([place]) 12/05/2006  . Osteoarthritis 12/05/2006    Immunization History  Administered Date(s) Administered  . Fluad Quad(high Dose 65+) 05/23/2020  . Influenza Split 05/12/2011, 07/09/2012  . Influenza Whole 06/30/2007, 05/29/2008, 07/03/2009  . Influenza, High Dose  Seasonal PF 05/14/2019  . Influenza,inj,Quad PF,6+ Mos 05/31/2013, 07/09/2014, 06/25/2015, 07/05/2016, 06/22/2017, 06/11/2018  . PFIZER(Purple Top)SARS-COV-2 Vaccination 10/28/2019, 11/18/2019  .  Pneumococcal Conjugate-13 02/14/2015  . Pneumococcal Polysaccharide-23 06/30/2007, 12/21/2012, 02/27/2018  . Td 06/30/2007    Conditions to be addressed/monitored:  Diabetes and Depression   Current Barriers:  . Hypoglycemia . Taking insulin after meal instead of before . Improper treatment of hypoglycemia  Pharmacist Clinical Goal(s):  Marland Kitchen Over the next 30 days, patient will achieve control of hypoglycemia as evidenced by no further blood glucose readings < 70  through collaboration with PharmD and provider.   Interventions: . 1:1 collaboration with Jinny Sanders, MD regarding development and update of comprehensive plan of care as evidenced by provider attestation and co-signature . Inter-disciplinary care team collaboration (see longitudinal plan of care) . Comprehensive medication review performed; medication list updated in electronic medical record  Diabetes (A1c goal <7%) -uncontrolled -Current medications: . Trulicity 3 mg - Inject once weekly  Novolin 70/30 - Inject 70 units in the morning and 35 units at evening meal (30 minutes before meals) -Medications previously tried:  metformin -Current home glucose readings - using libre, checking 10-15 times daily . fasting glucose: 80-110 . bedtime: 140-160 . Hypoglycemia: yes - reports waking up at night with readings 60-70, last night 45. Treated with a pack of peanut butter crackers. Reports BG came back to 110 after about an hour. - Adherence: Reports taking his insulin in the morning at 10 AM and 6 PM. Last night, he took his evening insulin after supper.  Takes 70 units in AM and 35 at supper time. Takes Trulicity 3 mg every Thursdays -Reports hypoglycemic symptoms. Denies hyperglycemic symptoms -Current meal patterns: reports  eating a small supper (ham and cheese sandwich or grilled chicken salad) and does not eat after 6 PM most nights. -Educated on rule of 15. Recommended purchasing some glucose tablets. -Recommended to continue current medication; Discussed he may need to reduce his evening Novolin by 3-5 units if continued nighttime lows. Recommend only taking insulin before meals, not after. Pick up some glucose tablets. Take 3-4 if any readings < 70. Patient will call if any BG < 70.  Depression/Anxiety (Goal: improve symptoms) -controlled -Current treatment: . Venlafaxine XR 75 and 150 mg daily  -Medications previously tried/failed: none reported Reports up and down days as far as mood. Confirms daily adherence to venlafaxine. Still seeing friends occasionally. Went to a birthday party last Saturday. He is concerned about COVID. Keeps his house clean and does yard work.  -Patient has follow up with PCP next month. He denies concerns today. -Recommended to continue current medication  Patient Goals/Self-Care Activities . Over the next 30 days, patient will:  - take medications as prescribed   Medication Adherence Review: Confirms:  Aspirin 81 mg  Plavix  Carvedilol  Losartan  Rosuvastatin- Taking 1 whole statin three days a week. Reports he used to take a half tablet. Able to take a whole the last 3 weeks.  Ezetimibe 10 mg - 1 daily  Venlaxafine  Furosemide - 1 daily, if gained weight takes 2 a day. Weighs daily. Weight usually fluctuates within 1-2 lbs 291 today, 292 yesterday.  Isosorbide mononitrate  1 BID  Nitroglycerin - PRN, denies chest pain  Vitamin D3 1000 units daily  Denies taking potassium currently (K+ WNL)  Follow Up Plan: The care management team will reach out to the patient again over the next 30 days.   Medication Assistance: Trulicityobtained through [facility] medication assistance program.  Enrollment ends 08/28/2021  Patient's preferred pharmacy is:  [facility] [place]  [address] [place] [address] Phone: [phone] Fax: [phone]  Uses pill box? Yes  We discussed: Benefits of medication synchronization, packaging and delivery as well as enhanced pharmacist oversight with [employer]. Patient decided to: Continue current medication management strategy  Care Plan and Follow Up Patient Decision:  Patient agrees to Care Plan and Follow-up.  Debbora Dus, PharmD Clinical Pharmacist [facility] [phone]

## 2020-10-16 NOTE — Progress Notes (Signed)
Patient called back 2/18 at 10:30 AM due to hypoglycemia. Reports one reading 10/15/20 at 6 PM of 71 and another reading on 10/16/20 at 2 AM of 72. Reports taking 70 units insulin before breakfast and 25 units before a snack around 8 PM on 2/17. Held his insulin before supper and waited until it came up to 140 then took a reduced dose of 25 units.  Recommended:    Reduce Novolin to 65 units before breakfast and 25 units before supper (current - 70 units AM, 35 units PM)   Only take Novolin 30-45 minutes before meals, not afterwards or at bedtime.   Proper treatment of hypoglycemia - rule of 15   [name], PharmD Clinical Pharmacist [facility] [phone]

## 2020-10-27 DIAGNOSIS — E113393 Type 2 diabetes mellitus with moderate nonproliferative diabetic retinopathy without macular edema, bilateral: Secondary | ICD-10-CM | POA: Diagnosis not present

## 2020-10-27 DIAGNOSIS — H35372 Puckering of macula, left eye: Secondary | ICD-10-CM | POA: Diagnosis not present

## 2020-10-27 DIAGNOSIS — H3582 Retinal ischemia: Secondary | ICD-10-CM | POA: Diagnosis not present

## 2020-10-27 DIAGNOSIS — H34813 Central retinal vein occlusion, bilateral, with macular edema: Secondary | ICD-10-CM | POA: Diagnosis not present

## 2020-10-27 DIAGNOSIS — H34812 Central retinal vein occlusion, left eye, with macular edema: Secondary | ICD-10-CM | POA: Diagnosis not present

## 2020-10-28 ENCOUNTER — Telehealth: Payer: Self-pay | Admitting: Family Medicine

## 2020-10-28 ENCOUNTER — Other Ambulatory Visit: Payer: Self-pay | Admitting: Cardiovascular Disease

## 2020-10-28 DIAGNOSIS — E559 Vitamin D deficiency, unspecified: Secondary | ICD-10-CM

## 2020-10-28 DIAGNOSIS — IMO0002 Reserved for concepts with insufficient information to code with codable children: Secondary | ICD-10-CM

## 2020-10-28 DIAGNOSIS — E538 Deficiency of other specified B group vitamins: Secondary | ICD-10-CM

## 2020-10-28 DIAGNOSIS — E1139 Type 2 diabetes mellitus with other diabetic ophthalmic complication: Secondary | ICD-10-CM

## 2020-10-28 DIAGNOSIS — D649 Anemia, unspecified: Secondary | ICD-10-CM

## 2020-10-28 NOTE — Telephone Encounter (Signed)
-----   Message from Alvina Chou sent at 10/19/2020 10:56 AM EST ----- Regarding: Lab orders for Thursday, 3.10.22 Patient is scheduled for CPX labs, please order future labs, Thanks , Camelia Eng

## 2020-11-02 ENCOUNTER — Other Ambulatory Visit (INDEPENDENT_AMBULATORY_CARE_PROVIDER_SITE_OTHER): Payer: HMO

## 2020-11-02 ENCOUNTER — Other Ambulatory Visit: Payer: Self-pay

## 2020-11-02 DIAGNOSIS — E1139 Type 2 diabetes mellitus with other diabetic ophthalmic complication: Secondary | ICD-10-CM | POA: Diagnosis not present

## 2020-11-02 DIAGNOSIS — E1165 Type 2 diabetes mellitus with hyperglycemia: Secondary | ICD-10-CM | POA: Diagnosis not present

## 2020-11-02 DIAGNOSIS — E538 Deficiency of other specified B group vitamins: Secondary | ICD-10-CM

## 2020-11-02 DIAGNOSIS — IMO0002 Reserved for concepts with insufficient information to code with codable children: Secondary | ICD-10-CM

## 2020-11-02 LAB — COMPREHENSIVE METABOLIC PANEL
ALT: 14 U/L (ref 0–53)
AST: 18 U/L (ref 0–37)
Albumin: 3.9 g/dL (ref 3.5–5.2)
Alkaline Phosphatase: 77 U/L (ref 39–117)
BUN: 14 mg/dL (ref 6–23)
CO2: 30 mEq/L (ref 19–32)
Calcium: 9.1 mg/dL (ref 8.4–10.5)
Chloride: 105 mEq/L (ref 96–112)
Creatinine, Ser: 1.05 mg/dL (ref 0.40–1.50)
GFR: 71.68 mL/min (ref >60.00)
Glucose, Bld: 144 mg/dL — ABNORMAL HIGH (ref 70–99)
Potassium: 4.5 mEq/L (ref 3.5–5.1)
Sodium: 143 mEq/L (ref 135–145)
Total Bilirubin: 0.4 mg/dL (ref 0.2–1.2)
Total Protein: 7.4 g/dL (ref 6.0–8.3)

## 2020-11-02 LAB — HEMOGLOBIN A1C: Hgb A1c MFr Bld: 7.7 % — ABNORMAL HIGH (ref 4.6–6.5)

## 2020-11-02 LAB — LIPID PANEL
Cholesterol: 107 mg/dL (ref 0–200)
HDL: 34.3 mg/dL — ABNORMAL LOW (ref >39.00)
LDL Cholesterol: 56 mg/dL (ref 0–99)
NonHDL: 72.71
Total CHOL/HDL Ratio: 3
Triglycerides: 82 mg/dL (ref 0.0–149.0)
VLDL: 16.4 mg/dL (ref 0.0–40.0)

## 2020-11-02 LAB — VITAMIN B12: Vitamin B-12: 111 pg/mL — ABNORMAL LOW (ref 211–911)

## 2020-11-03 NOTE — Progress Notes (Signed)
No critical labs need to be addressed urgently. We will discuss labs in detail at upcoming office visit.   

## 2020-11-05 ENCOUNTER — Other Ambulatory Visit: Payer: HMO

## 2020-11-09 ENCOUNTER — Other Ambulatory Visit: Payer: Self-pay | Admitting: Cardiovascular Disease

## 2020-11-10 ENCOUNTER — Other Ambulatory Visit: Payer: Self-pay

## 2020-11-10 ENCOUNTER — Ambulatory Visit (INDEPENDENT_AMBULATORY_CARE_PROVIDER_SITE_OTHER): Payer: HMO

## 2020-11-10 DIAGNOSIS — Z0000 Encounter for general adult medical examination without abnormal findings: Secondary | ICD-10-CM

## 2020-11-10 NOTE — Progress Notes (Signed)
PCP notes:  Health Maintenance: Tdap- insurance Colonoscopy- due Foot exam- due   Abnormal Screenings: none   Patient concerns: none   Nurse concerns: none   Next PCP appt.: 11/12/2020 @ 2 pm

## 2020-11-10 NOTE — Progress Notes (Signed)
Subjective:   William HuskJohn H [name] is a 70 y.o. male who presents for Medicare Annual/Subsequent preventive examination.  Review of Systems: N/A      I connected with the patient today by telephone and verified that I am speaking with the correct person using two identifiers. Location patient: home Location nurse: work Persons participating in the telephone visit: patient, nurse.   I discussed the limitations, risks, security and privacy concerns of performing an evaluation and management service by telephone and the availability of in person appointments. I also discussed with the patient that there may be a patient responsible charge related to this service. The patient expressed understanding and verbally consented to this telephonic visit.        Cardiac Risk Factors include: advanced age (>[id number], 28>65 women);diabetes mellitus;hypertension;male gender;Other (see comment), Risk factor comments: hyperlipidemia     Objective:    Today's Vitals   11/10/20 1401  PainSc: 10-Worst pain ever   There is no height or weight on file to calculate BMI.  Advanced Directives 11/10/2020 05/06/2020 04/01/2020 03/31/2020 01/30/2020 02/18/2019 02/05/2019  Does Patient Have a Medical Advance Directive? No No No No No No No  Would patient like information on creating a medical advance directive? No - Patient declined Yes (MAU/Ambulatory/Procedural Areas - Information given) No - Patient declined No - Patient declined Yes (MAU/Ambulatory/Procedural Areas - Information given) No - Patient declined Yes (MAU/Ambulatory/Procedural Areas - Information given)    Current Medications (verified) Outpatient Encounter Medications as of 11/10/2020  Medication Sig  . aspirin 81 MG EC tablet Take 1 tablet (81 mg total) by mouth daily.  . carvedilol (COREG) 6.25 MG tablet TAKE 1 TABLET BY MOUTH ONCE DAILY  . cholecalciferol (VITAMIN D3) 25 MCG (1000 UNIT) tablet Take 2,000 Units by mouth daily.  . clopidogrel (PLAVIX) 75 MG  tablet TAKE 1 TABLET BY MOUTH ONCE DAILY WITH BREAKFAST  . Continuous Blood Gluc Receiver (FREESTYLE LIBRE 14 DAY READER) DEVI 1 Device by Does not apply route every 14 (fourteen) days.  . Continuous Blood Gluc Sensor (FREESTYLE LIBRE 14 DAY SENSOR) MISC USE TO CHECK BLOOD SUGAR AS DIRECTED. CHANGE EVERY 14 DAYS.  . Dulaglutide (TRULICITY) 1.5 MG/0.5ML SOPN Inject 3 mg into the skin. Inject 3 mg once weekly (patient receives from [facility])  . ezetimibe (ZETIA) 10 MG tablet TAKE 1 TABLET BY MOUTH ONCE DAILY  . furosemide (LASIX) 40 MG tablet Take 1 tablet (40 mg total) by mouth 2 (two) times daily. Can take a 2nd daily dose as needed.  . insulin NPH-regular Human (NOVOLIN 70/30) (70-30) 100 UNIT/ML injection Inject 70 units in the morning and 35 units at bedtime.  . isosorbide mononitrate (IMDUR) 30 MG 24 hr tablet TAKE 1 TABLET BY MOUTH TWICE DAILY  . losartan (COZAAR) 50 MG tablet Take 1 tablet (50 mg total) by mouth daily.  . nitroGLYCERIN (NITROSTAT) 0.4 MG SL tablet DISSOLVE 1 TABLET UNDER TONGUE AS NEEDEDFOR CHEST PAIN. MAY REPEAT 5 MINUTES APART 3 TIMES IF NEEDED  . potassium chloride SA (K-DUR) 20 MEQ tablet Take 1 tablet (20 mEq total) by mouth daily.  . rosuvastatin (CRESTOR) 40 MG tablet Take 40 mg by mouth 3 (three) times a week.  . venlafaxine XR (EFFEXOR-XR) 150 MG 24 hr capsule TAKE 1 CAPSULE BY MOUTH ONCE DAILY WITH BREAKFAST. TAKE WITH 75 MG DOSE TO EQUAL 225MG  DAILY  . venlafaxine XR (EFFEXOR-XR) 75 MG 24 hr capsule TAKE 1 CAPSULE BY MOUTH ONCE DAILY. TAKEIN ADDITION TO THE 150 MG  CAPSULE TO EQUAL A TOTAL DOSE OF 225 MG DAILY   No facility-administered encounter medications on file as of 11/10/2020.    Allergies (verified) Atorvastatin   History: Past Medical History:  Diagnosis Date  . Back injury 02/2002   worker's comp  . CHF (congestive heart failure) (HCC)   . Coronary artery disease, non-occlusive    a. cath 2002 with no sig CAD;  b. cath 2008 normal LM, LAD, LCx,  p&dRCA 20-30%, PDA 30%; c.11/2015 NSTEMI/PCI: LM nl, LAD 95p (2.5x15 Xience DES), LCX nl, RCA 100p/m w/ L->R collats, EF 55-65% c. NSTEMI (02/2016) with no culprit leision, switched to Brilinta.  d. NSTEMI 03/2016: again, no culprit lesion and switched back to plavix 2/2 SOB with Brilnta.    . Depression   . Diabetes mellitus type 2, insulin dependent (HCC)   . Hyperlipemia   . Hypertensive heart disease   . Kidney stones   . Morbid obesity (HCC)   . Osteoarthritis   . Snoring    Past Surgical History:  Procedure Laterality Date  . CARDIAC CATHETERIZATION  09/2000   diffuse LAD 30% LCA  EF 50-60%  . CARDIAC CATHETERIZATION  06/2007   no significant CAD  . CARDIAC CATHETERIZATION N/A 12/25/2015   Procedure: Left Heart Cath and Coronary Angiography;  Surgeon: Antonieta Iba, MD;  Location: [facility] INVASIVE CV LAB;  Service: Cardiovascular;  Laterality: N/A;  . CARDIAC CATHETERIZATION N/A 12/25/2015   Procedure: Coronary Stent Intervention;  Surgeon: Alwyn Pea, MD;  Location: [facility] INVASIVE CV LAB;  Service: Cardiovascular;  Laterality: N/A;  . CARDIAC CATHETERIZATION N/A 03/10/2016   Procedure: Left Heart Cath and Coronary Angiography;  Surgeon: Iran Ouch, MD;  Location: [facility] INVASIVE CV LAB;  Service: Cardiovascular;  Laterality: N/A;  . CARDIAC CATHETERIZATION N/A 04/06/2016   Procedure: Left Heart Cath and Coronary Angiography;  Surgeon: Lyn Records, MD;  Location: [facility] INVASIVE CV LAB;  Service: Cardiovascular;  Laterality: N/A;  . CIRCUMCISION     Family History  Problem Relation Age of Onset  . Alzheimer's disease Mother   . Emphysema Mother   . Diabetes Father   . Heart disease Father        MI  . Cancer Brother        ? Neck cancer   Social History   Socioeconomic History  . Marital status: Widowed    Spouse name: Not on file  . Number of children: Not on file  . Years of education: Not on file  . Highest education level: Not on file  Occupational History  .  Occupation: disabled    Associate Professor: UNEMPLOYED    Comment: back injury  Tobacco Use  . Smoking status: Never Smoker  . Smokeless tobacco: Never Used  Vaping Use  . Vaping Use: Never used  Substance and Sexual Activity  . Alcohol use: No    Alcohol/week: 0.0 standard drinks  . Drug use: No  . Sexual activity: Not Currently  Other Topics Concern  . Not on file  Social History Narrative   Financial concerns, wife with depression.   Minimal exercise.   Diet: poor.   Social Determinants of Health   Financial Resource Strain: Low Risk   . Difficulty of Paying Living Expenses: Not hard at all  Food Insecurity: No Food Insecurity  . Worried About Programme researcher, broadcasting/film/video in the Last Year: Never true  . Ran Out of Food in the Last Year: Never true  Transportation Needs: No Transportation  Needs  . Lack of Transportation (Medical): No  . Lack of Transportation (Non-Medical): No  Physical Activity: Inactive  . Days of Exercise per Week: 0 days  . Minutes of Exercise per Session: 0 min  Stress: Stress Concern Present  . Feeling of Stress : To some extent  Social Connections: Not on file    Tobacco Counseling Counseling given: Not Answered   Clinical Intake:  Pre-visit preparation completed: Yes  Pain : 0-10 Pain Score: 10-Worst pain ever Pain Type: Chronic pain Pain Location: Back Pain Orientation: Lower Pain Descriptors / Indicators: Aching Pain Onset: More than a month ago Pain Frequency: Intermittent     Nutritional Risks: None Diabetes: Yes CBG done?: No Did pt. bring in CBG monitor from home?: No  How often do you need to have someone help you when you read instructions, pamphlets, or other written materials from your doctor or pharmacy?: 1 - Never What is the last grade level you completed in school?: 7th  Diabetic: Yes Nutrition Risk Assessment:  Has the patient had any N/V/D within the last 2 months?  No  Does the patient have any non-healing wounds?  No  Has  the patient had any unintentional weight loss or weight gain?  No   Diabetes:  Is the patient diabetic?  Yes  If diabetic, was a CBG obtained today?  No  telephone visit  Did the patient bring in their glucometer from home?  No  telephone visit How often do you monitor your CBG's? Twice daily.   Financial Strains and Diabetes Management:  Are you having any financial strains with the device, your supplies or your medication? No .  Does the patient want to be seen by Chronic Care Management for management of their diabetes?  No  Would the patient like to be referred to a Nutritionist or for Diabetic Management?  No   Diabetic Exams:  Diabetic Eye Exam: Completed 12/11/2019 Diabetic Foot Exam: Overdue, Pt has been advised about the importance in completing this exam. Pt is scheduled for diabetic foot exam on 11/12/2020.   Interpreter Needed?: No  Information entered by :: CJohnson, LPN   Activities of Daily Living In your present state of health, do you have any difficulty performing the following activities: 11/10/2020 04/01/2020  Hearing? N -  Vision? Y -  Comment 80% blind -  Difficulty concentrating or making decisions? N -  Walking or climbing stairs? N -  Dressing or bathing? N -  Doing errands, shopping? N N  Comment - -  Quarry manager and eating ? N -  Using the Toilet? N -  In the past six months, have you accidently leaked urine? N -  Do you have problems with loss of bowel control? N -  Managing your Medications? N -  Managing your Finances? N -  Housekeeping or managing your Housekeeping? N -  Some recent data might be hidden    Patient Care Team: [name], MD as PCP - General Mariah Milling Tollie Pizza, MD as Consulting Physician (Cardiology) [name], [facility] as Pharmacist (Pharmacist)  Indicate any recent Medical Services you may have received from other than [facility] providers in the past year (date may be approximate).     Assessment:   This is a routine  wellness examination for [name].  Hearing/Vision screen  Hearing Screening   125Hz  250Hz  500Hz  1000Hz  2000Hz  3000Hz  4000Hz  6000Hz  8000Hz   Right ear:           Left ear:  Vision Screening Comments: Patient gets annual eye exams   Dietary issues and exercise activities discussed: Current Exercise Habits: The patient does not participate in regular exercise at present, Exercise limited by: orthopedic condition(s)  Goals    . Patient Stated     11/10/2020, I will maintain and continue medications as prescribed.     . Pharmacy Care Plan     CARE PLAN ENTRY (see longitudinal plan of care for additional care plan information)  Current Barriers:  . Chronic Disease Management support, education, and care coordination needs related to Hyperlipidemia, Diabetes, and Depression  Hyperlipidemia Lab Results  Component Value Date/Time   LDLCALC 52 07/31/2020 08:41 AM   LDLDIRECT 174.0 10/19/2017 04:00 PM .  Pharmacist Clinical Goal(s): o Over the next 3 months, patient will work with PharmD and providers to achieve LDL goal < 70 . Current regimen:  o Ezetimibe 10 mg - 1 tablet daily  o Rosuvastatin 40 mg -1 tablet three times weekly  . Interventions: o Continue rosuvastatin 40 mg - 1 tablet three times weekly.  . Patient self care activities - Over the next 3 months, patient will: o Continue rosuvastatin 40 mg - 1 tablet three times weekly.  Diabetes Lab Results  Component Value Date/Time   HGBA1C 8.6 (H) 07/31/2020 08:41 AM   HGBA1C 11.1 (H) 04/14/2020 08:34 AM   HGBA1C 9.6 10/02/2018 12:00 AM .  Pharmacist Clinical Goal(s): o Over the next 3 months, patient will work with PharmD and providers to achieve A1c goal <7% . Current regimen:  o Novolin 70/30 - Inject 70 units in the morning and 35 units at evening meal (30 minutes before meals) o Trulicity 1.5 mg - Inject once weekly  . Interventions: o Coordinate Trulicity application renewal from [employer] o Consult PCP to  consider Trulicity dose increase . Patient self care activities - Over the next 3 months, patient will: o Contact provider with any episodes of hypoglycemia o Come into office to sign Trulicity application on 09/04/19 o Take your insulin 30 minutes before you eat instead of after meals due to prevent low blood sugars  Depression . Pharmacist Clinical Goal(s) o Over the next 3 months, patient will work with PharmD and providers to improve depressed mood . Current regimen:  o Venlafaxine XR 75 mg and 150 mg - take total dose of 225 mg once daily . Interventions: o Discussed mood with patient and he is doing much better getting out of the house daily to see friends . Patient self care activities - Over the next 3 months, patient will: o Continue current medications  Please see past updates related to this goal by clicking on the "Past Updates" button in the selected goal        Depression Screen PHQ 2/9 Scores 11/10/2020 06/12/2020 01/30/2020 03/19/2019 02/18/2019 02/05/2019 02/27/2018  PHQ - 2 Score PHQ- 9 Score Fall Risk Fall Risk  11/10/2020 03/19/2019 02/18/2019 02/27/2018 02/21/2017  Falls in the past year? 1 1 0 Yes No  Comment - - - fell while walking dog; fell at church after missing a step -  Number falls in past yr: 1 1 0 2 or more -  Injury with Fall? 0 0 0 Yes -  Risk Factor Category  - - - High Fall Risk -  Risk for fall due to : History of fall(s);Impaired balance/gait - Other (Comment) - -  Follow  up Falls evaluation completed;Falls prevention discussed - Education provided;Falls prevention discussed - -    FALL RISK PREVENTION PERTAINING TO THE HOME:  Any stairs in or around the home? Yes  If so, are there any without handrails? No  Home free of loose throw rugs in walkways, pet beds, electrical cords, etc? Yes  Adequate lighting in your home to reduce risk of falls? Yes   ASSISTIVE DEVICES UTILIZED TO PREVENT FALLS:  Life alert? No  Use  of a cane, walker or w/c? No  Grab bars in the bathroom? No  Shower chair or bench in shower? No  Elevated toilet seat or a handicapped toilet? No   TIMED UP AND GO:  Was the test performed? N/A telephone visit .    Cognitive Function: MMSE - Mini Mental State Exam 11/10/2020 02/27/2018 02/21/2017  Orientation to time 5 5 5   Orientation to Place 5 5 5   Registration 3 3 3   Attention/ Calculation 5 0 0  Recall 3 3 3   Language- name 2 objects - 0 0  Language- repeat 1 1 1   Language- follow 3 step command - 3 3  Language- read & follow direction - 0 0  Write a sentence - 0 0  Copy design - 0 0  Total score - 20 20  Mini Cog  Mini-Cog screen was completed. Maximum score is 22. A value of 0 denotes this part of the MMSE was not completed or the patient failed this part of the Mini-Cog screening.      Immunizations Immunization History  Administered Date(s) Administered  . Fluad Quad(high Dose 65+) 05/23/2020  . Influenza Split 05/12/2011, 07/09/2012  . Influenza Whole 06/30/2007, 05/29/2008, 07/03/2009  . Influenza, High Dose Seasonal PF 05/14/2019  . Influenza,inj,Quad PF,6+ Mos 05/31/2013, 07/09/2014, 06/25/2015, 07/05/2016, 06/22/2017, 06/11/2018  . PFIZER(Purple Top)SARS-COV-2 Vaccination 10/28/2019, 11/18/2019, 05/29/2020  . Pneumococcal Conjugate-13 02/14/2015  . Pneumococcal Polysaccharide-23 06/30/2007, 12/21/2012, 02/27/2018  . Td 06/30/2007    TDAP status: Due, Education has been provided regarding the importance of this vaccine. Advised may receive this vaccine at local pharmacy or Health Dept. Aware to provide a copy of the vaccination record if obtained from local pharmacy or Health Dept. Verbalized acceptance and understanding.  Flu Vaccine status: Up to date  Pneumococcal vaccine status: Up to date  Covid-19 vaccine status: Completed vaccines  Qualifies for Shingles Vaccine? Yes   Zostavax completed No   Shingrix Completed?: No.    Education has been  provided regarding the importance of this vaccine. Patient has been advised to call insurance company to determine out of pocket expense if they have not yet received this vaccine. Advised may also receive vaccine at local pharmacy or Health Dept. Verbalized acceptance and understanding.  Screening Tests Health Maintenance  Topic Date Due  . COLONOSCOPY (Pts 45-10yrs Insurance coverage will need to be confirmed)  09/04/2019  . FOOT EXAM  03/18/2020  . TETANUS/TDAP  11/11/2023 (Originally 06/29/2017)  . OPHTHALMOLOGY EXAM  12/10/2020  . HEMOGLOBIN A1C  05/05/2021  . INFLUENZA VACCINE  Completed  . COVID-19 Vaccine  Completed  . Hepatitis C Screening  Completed  . PNA vac Low Risk Adult  Completed  . HPV VACCINES  Aged Out    Health Maintenance  Health Maintenance Due  Topic Date Due  . COLONOSCOPY (Pts 45-53yrs Insurance coverage will need to be confirmed)  09/04/2019  . FOOT EXAM  03/18/2020    Colorectal cancer screening: due, will discuss with provider at physical  Lung  Cancer Screening: (Low Dose CT Chest recommended if Age 28-80 years, 30 pack-year currently smoking OR have quit w/in 15 years.) does not qualify.     Additional Screening:  Hepatitis C Screening: does qualify; Completed 10/21/2015  Vision Screening: Recommended annual ophthalmology exams for early detection of glaucoma and other disorders of the eye. Is the patient up to date with their annual eye exam?  Yes  Who is the provider or what is the name of the office in which the patient attends annual eye exams? Dr. Lelon Perla. [facility]  If pt is not established with a provider, would they like to be referred to a provider to establish care? No .   Dental Screening: Recommended annual dental exams for proper oral hygiene  Community Resource Referral / Chronic Care Management: CRR required this visit?  No   CCM required this visit?  No      Plan:     I have personally reviewed and noted the  following in the patient's chart:   . Medical and social history . Use of alcohol, tobacco or illicit drugs  . Current medications and supplements . Functional ability and status . Nutritional status . Physical activity . Advanced directives . List of other physicians . Hospitalizations, surgeries, and ER visits in previous 12 months . Vitals . Screenings to include cognitive, depression, and falls . Referrals and appointments  In addition, I have reviewed and discussed with patient certain preventive protocols, quality metrics, and best practice recommendations. A written personalized care plan for preventive services as well as general preventive health recommendations were provided to patient.   Due to this being a telephonic visit, the after visit summary with patients personalized plan was offered to patient via office or my-chart. Patient preferred to pick up at office at next visit or via mychart.   Shaft, Corigliano, LPN   [id number]/[id number]

## 2020-11-10 NOTE — Patient Instructions (Addendum)
[name] , Thank you for taking time to come for your Medicare Wellness Visit. I appreciate your ongoing commitment to your health goals. Please review the following plan we discussed and let me know if I can assist you in the future.   Screening recommendations/referrals: Colonoscopy: due, will discuss with provider at physical  Recommended yearly ophthalmology/optometry visit for glaucoma screening and checkup Recommended yearly dental visit for hygiene and checkup  Vaccinations: Influenza vaccine: Up to date, completed 05/23/2020, due 03/2021 Pneumococcal vaccine: Completed series Tdap vaccine: decline-insurance  Shingles vaccine: due, check with your insurance regarding coverage if interested    Covid-19: Completed series  Advanced directives:Advance directive discussed with you today. Even though you declined this today please call our office should you change your mind and we can give you the proper paperwork for you to fill out.   Conditions/risks identified: diabetes, hypertension, hyperlipidemia   Next appointment: Follow up in one year for your annual wellness visit.   Preventive Care 71 Years and Older, Male Preventive care refers to lifestyle choices and visits with your health care provider that can promote health and wellness. What does preventive care include?  A yearly physical exam. This is also called an annual well check.  Dental exams once or twice a year.  Routine eye exams. Ask your health care provider how often you should have your eyes checked.  Personal lifestyle choices, including:  Daily care of your teeth and gums.  Regular physical activity.  Eating a healthy diet.  Avoiding tobacco and drug use.  Limiting alcohol use.  Practicing safe sex.  Taking low doses of aspirin every day.  Taking vitamin and mineral supplements as recommended by your health care provider. What happens during an annual well check? The services and screenings done  by your health care provider during your annual well check will depend on your age, overall health, lifestyle risk factors, and family history of disease. Counseling  Your health care provider may ask you questions about your:  Alcohol use.  Tobacco use.  Drug use.  Emotional well-being.  Home and relationship well-being.  Sexual activity.  Eating habits.  History of falls.  Memory and ability to understand (cognition).  Work and work Astronomer. Screening  You may have the following tests or measurements:  Height, weight, and BMI.  Blood pressure.  Lipid and cholesterol levels. These may be checked every 5 years, or more frequently if you are over 41 years old.  Skin check.  Lung cancer screening. You may have this screening every year starting at age 14 if you have a 30-pack-year history of smoking and currently smoke or have quit within the past 15 years.  Fecal occult blood test (FOBT) of the stool. You may have this test every year starting at age 71.  Flexible sigmoidoscopy or colonoscopy. You may have a sigmoidoscopy every 5 years or a colonoscopy every 10 years starting at age 71.  Prostate cancer screening. Recommendations will vary depending on your family history and other risks.  Hepatitis C blood test.  Hepatitis B blood test.  Sexually transmitted disease (STD) testing.  Diabetes screening. This is done by checking your blood sugar (glucose) after you have not eaten for a while (fasting). You may have this done every 1-3 years.  Abdominal aortic aneurysm (AAA) screening. You may need this if you are a current or former smoker.  Osteoporosis. You may be screened starting at age 71 if you are at high risk. Talk with your  health care provider about your test results, treatment options, and if necessary, the need for more tests. Vaccines  Your health care provider Gershman recommend certain vaccines, such as:  Influenza vaccine. This is recommended every  year.  Tetanus, diphtheria, and acellular pertussis (Tdap, Td) vaccine. You Hinnenkamp need a Td booster every 10 years.  Zoster vaccine. You Helmuth need this after age 71.  Pneumococcal 13-valent conjugate (PCV13) vaccine. One dose is recommended after age 71.  Pneumococcal polysaccharide (PPSV23) vaccine. One dose is recommended after age 76. Talk to your health care provider about which screenings and vaccines you need and how often you need them. This information is not intended to replace advice given to you by your health care provider. Make sure you discuss any questions you have with your health care provider. Document Released: 09/11/2015 Document Revised: 05/04/2016 Document Reviewed: 06/16/2015 Elsevier Interactive Patient Education  2017 [place] Prevention in the Home Falls can cause injuries. They can happen to people of all ages. There are many things you can do to make your home safe and to help prevent falls. What can I do on the outside of my home?  Regularly fix the edges of walkways and driveways and fix any cracks.  Remove anything that might make you trip as you walk through a door, such as a raised step or threshold.  Trim any bushes or trees on the path to your home.  Use bright outdoor lighting.  Clear any walking paths of anything that might make someone trip, such as rocks or tools.  Regularly check to see if handrails are loose or broken. Make sure that both sides of any steps have handrails.  Any raised decks and porches should have guardrails on the edges.  Have any leaves, snow, or ice cleared regularly.  Use sand or salt on walking paths during winter.  Clean up any spills in your garage right away. This includes oil or grease spills. What can I do in the bathroom?  Use night lights.  Install grab bars by the toilet and in the tub and shower. Do not use towel bars as grab bars.  Use non-skid mats or decals in the tub or shower.  If you  need to sit down in the shower, use a plastic, non-slip stool.  Keep the floor dry. Clean up any water that spills on the floor as soon as it happens.  Remove soap buildup in the tub or shower regularly.  Attach bath mats securely with double-sided non-slip rug tape.  Do not have throw rugs and other things on the floor that can make you trip. What can I do in the bedroom?  Use night lights.  Make sure that you have a light by your bed that is easy to reach.  Do not use any sheets or blankets that are too big for your bed. They should not hang down onto the floor.  Have a firm chair that has side arms. You can use this for support while you get dressed.  Do not have throw rugs and other things on the floor that can make you trip. What can I do in the kitchen?  Clean up any spills right away.  Avoid walking on wet floors.  Keep items that you use a lot in easy-to-reach places.  If you need to reach something above you, use a strong step stool that has a grab bar.  Keep electrical cords out of the way.  Do not use  floor polish or wax that makes floors slippery. If you must use wax, use non-skid floor wax.  Do not have throw rugs and other things on the floor that can make you trip. What can I do with my stairs?  Do not leave any items on the stairs.  Make sure that there are handrails on both sides of the stairs and use them. Fix handrails that are broken or loose. Make sure that handrails are as long as the stairways.  Check any carpeting to make sure that it is firmly attached to the stairs. Fix any carpet that is loose or worn.  Avoid having throw rugs at the top or bottom of the stairs. If you do have throw rugs, attach them to the floor with carpet tape.  Make sure that you have a light switch at the top of the stairs and the bottom of the stairs. If you do not have them, ask someone to add them for you. What else can I do to help prevent falls?  Wear shoes  that:  Do not have high heels.  Have rubber bottoms.  Are comfortable and fit you well.  Are closed at the toe. Do not wear sandals.  If you use a stepladder:  Make sure that it is fully opened. Do not climb a closed stepladder.  Make sure that both sides of the stepladder are locked into place.  Ask someone to hold it for you, if possible.  Clearly mark and make sure that you can see:  Any grab bars or handrails.  First and last steps.  Where the edge of each step is.  Use tools that help you move around (mobility aids) if they are needed. These include:  Canes.  Walkers.  Scooters.  Crutches.  Turn on the lights when you go into a dark area. Replace any light bulbs as soon as they burn out.  Set up your furniture so you have a clear path. Avoid moving your furniture around.  If any of your floors are uneven, fix them.  If there are any pets around you, be aware of where they are.  Review your medicines with your doctor. Some medicines can make you feel dizzy. This can increase your chance of falling. Ask your doctor what other things that you can do to help prevent falls. This information is not intended to replace advice given to you by your health care provider. Make sure you discuss any questions you have with your health care provider. Document Released: 06/11/2009 Document Revised: 01/21/2016 Document Reviewed: 09/19/2014 Elsevier Interactive Patient Education  2017 [employer].

## 2020-11-11 ENCOUNTER — Other Ambulatory Visit: Payer: Self-pay

## 2020-11-12 ENCOUNTER — Ambulatory Visit (INDEPENDENT_AMBULATORY_CARE_PROVIDER_SITE_OTHER)
Admission: RE | Admit: 2020-11-12 | Discharge: 2020-11-12 | Disposition: A | Discharge status: Home / Self Care | Payer: HMO | Source: Ambulatory Visit | Attending: Family Medicine | Admitting: Family Medicine

## 2020-11-12 ENCOUNTER — Encounter: Payer: Self-pay | Admitting: Family Medicine

## 2020-11-12 ENCOUNTER — Ambulatory Visit (INDEPENDENT_AMBULATORY_CARE_PROVIDER_SITE_OTHER): Payer: HMO | Admitting: Family Medicine

## 2020-11-12 ENCOUNTER — Other Ambulatory Visit: Payer: Self-pay

## 2020-11-12 VITALS — BP 140/62 | HR 76 | Temp 98.1°F | Ht 65.5 in | Wt 288.8 lb

## 2020-11-12 DIAGNOSIS — E113393 Type 2 diabetes mellitus with moderate nonproliferative diabetic retinopathy without macular edema, bilateral: Secondary | ICD-10-CM | POA: Diagnosis not present

## 2020-11-12 DIAGNOSIS — E785 Hyperlipidemia, unspecified: Secondary | ICD-10-CM | POA: Diagnosis not present

## 2020-11-12 DIAGNOSIS — E1169 Type 2 diabetes mellitus with other specified complication: Secondary | ICD-10-CM

## 2020-11-12 DIAGNOSIS — F331 Major depressive disorder, recurrent, moderate: Secondary | ICD-10-CM | POA: Diagnosis not present

## 2020-11-12 DIAGNOSIS — G8929 Other chronic pain: Secondary | ICD-10-CM

## 2020-11-12 DIAGNOSIS — E1139 Type 2 diabetes mellitus with other diabetic ophthalmic complication: Secondary | ICD-10-CM | POA: Diagnosis not present

## 2020-11-12 DIAGNOSIS — IMO0002 Reserved for concepts with insufficient information to code with codable children: Secondary | ICD-10-CM

## 2020-11-12 DIAGNOSIS — E1159 Type 2 diabetes mellitus with other circulatory complications: Secondary | ICD-10-CM

## 2020-11-12 DIAGNOSIS — M545 Low back pain, unspecified: Secondary | ICD-10-CM | POA: Diagnosis not present

## 2020-11-12 DIAGNOSIS — Z0000 Encounter for general adult medical examination without abnormal findings: Secondary | ICD-10-CM

## 2020-11-12 DIAGNOSIS — E538 Deficiency of other specified B group vitamins: Secondary | ICD-10-CM

## 2020-11-12 DIAGNOSIS — I152 Hypertension secondary to endocrine disorders: Secondary | ICD-10-CM

## 2020-11-12 DIAGNOSIS — E1142 Type 2 diabetes mellitus with diabetic polyneuropathy: Secondary | ICD-10-CM | POA: Diagnosis not present

## 2020-11-12 DIAGNOSIS — E1165 Type 2 diabetes mellitus with hyperglycemia: Secondary | ICD-10-CM

## 2020-11-12 DIAGNOSIS — I110 Hypertensive heart disease with heart failure: Secondary | ICD-10-CM

## 2020-11-12 LAB — HM DIABETES FOOT EXAM

## 2020-11-12 MED ORDER — TRAMADOL HCL 50 MG PO TABS: 50.0000 mg | ORAL_TABLET | Freq: Two times a day (BID) | ORAL | 0 refills | Status: DC | PRN

## 2020-11-12 NOTE — Patient Instructions (Addendum)
Continue current regimen. Great job! Continue with weight loss and regular exercise.  Start B12 1000 mcg  daily to every other day.  We will call with X-ray results.  Start tramadol twice daily as needed for low back pain.. call for longer prescription if helping after 5 days.

## 2020-11-12 NOTE — Progress Notes (Signed)
Patient ID: [name], male    DOB: [birth year], 70 y.o.   MRN: [id number]  This visit was conducted in person.  BP 140/62   Pulse 84   Temp 98.1 F (36.7 C) (Temporal)   Ht 5' 5.5" (1.664 m)   Wt 288 lb 12 oz (131 kg)   SpO2 90%   BMI 47.32 kg/m    CC:  Chief Complaint  Patient presents with  . Annual Exam    Part 2    Subjective:   HPI: [name] is a 70 y.o. male presenting on 11/12/2020 for Annual Exam (Part 2) and  complete physical and review of chronic health problems. He/She also has the following acute concerns today:  The patient saw a LPN or RN for medicare wellness visit.  Prevention and wellness was reviewed in detail. Note reviewed and important notes copied below.  Health Maintenance: Tdap- insurance Colonoscopy- due Foot exam- due   Abnormal Screenings: None  11/12/20  Diabetes:   Improved control on  Trulicity 3 mg weekly, Novolin70/30  70 Units/35 Units daily Lab Results  Component Value Date   HGBA1C 7.7 (H) 11/02/2020  Using medications without difficulties: Hypoglycemic episodes: if does not eat at night... FBS  Low next day... he is adjusting PM insulin dose for 25-Units. Hyperglycemic episodes: Feet problems: no ulcers, numbness in feet Blood Sugars averaging: FBS 75- 120 .. later in day 250-280 Wt Readings from Last 3 Encounters:  11/12/20 288 lb 12 oz (131 kg)  08/07/20 294 lb (133.4 kg)  04/21/20 291 lb (132 kg)    eye exam within last year: Diabetic retinopathy  Elevated Cholesterol:  LDL at goal on statin.Marland Kitchen on Crestor 40 mg daily and Zetia 10 mg daily Lab Results  Component Value Date   CHOL 107 11/02/2020   HDL 34.30 (L) 11/02/2020   LDLCALC 56 11/02/2020   LDLDIRECT 174.0 10/19/2017   TRIG 82.0 11/02/2020   CHOLHDL 3 11/02/2020  Using medications without problems: Muscle aches:  Diet compliance: trying to work on low carb diet Exercise: cannot do Other complaints:  Hypertension:    Borderline control in  office today on coreg, losartan, lasix, isosorbide BP Readings from Last 3 Encounters:  11/12/20 140/62  04/02/20 (!) 137/50  01/21/20 138/60  Using medication without problems or lightheadedness:  none Chest pain with exertion: none Edema:none Short of breath: none Average home BPs: Other issues:  MDD:  Good control on venlafaxine 215 mg daily Flowsheet Row Clinical Support from 11/10/2020 in [facility] Total Score 2     He has chronic low back pain.. cannot get comfortable at night  Ongoing since injury on job  2001.  no leg pain, no  New numbness, no new weakness.  Cannot exercise given low back pain.    Pain flared up after falling summer last year.. slipped when moping.  Relevant past medical, surgical, family and social history reviewed and updated as indicated. Interim medical history since our last visit reviewed. Allergies and medications reviewed and updated. Outpatient Medications Prior to Visit  Medication Sig Dispense Refill  . aspirin 81 MG EC tablet Take 1 tablet (81 mg total) by mouth daily.    . carvedilol (COREG) 6.25 MG tablet TAKE 1 TABLET BY MOUTH ONCE DAILY 90 tablet 0  . cholecalciferol (VITAMIN D3) 25 MCG (1000 UNIT) tablet Take 2,000 Units by mouth daily.    . clopidogrel (PLAVIX) 75 MG tablet TAKE 1 TABLET  BY MOUTH ONCE DAILY WITH BREAKFAST 90 tablet 1  . Continuous Blood Gluc Receiver (FREESTYLE LIBRE 14 DAY READER) DEVI 1 Device by Does not apply route every 14 (fourteen) days. 1 Device 0  . Continuous Blood Gluc Sensor (FREESTYLE LIBRE 14 DAY SENSOR) MISC USE TO CHECK BLOOD SUGAR AS DIRECTED. CHANGE EVERY 14 DAYS. 2 each 5  . Dulaglutide (TRULICITY) 1.5 MG/0.5ML SOPN Inject 3 mg into the skin. Inject 3 mg once weekly ([facility] from [facility])    . ezetimibe (ZETIA) 10 MG tablet TAKE 1 TABLET BY MOUTH ONCE DAILY 90 tablet 0  . furosemide (LASIX) 40 MG tablet Take 1 tablet (40 mg total) by mouth 2 (two) times daily.  Can take a 2nd daily dose as needed. 60 tablet 0  . insulin NPH-regular Human (NOVOLIN 70/30) (70-30) 100 UNIT/ML injection Inject 70 units in the morning and 35 units at bedtime. 50 mL 5  . isosorbide mononitrate (IMDUR) 30 MG 24 hr tablet TAKE 1 TABLET BY MOUTH TWICE DAILY 180 tablet 2  . losartan (COZAAR) 50 MG tablet Take 1 tablet (50 mg total) by mouth daily. 90 tablet 1  . nitroGLYCERIN (NITROSTAT) 0.4 MG SL tablet DISSOLVE 1 TABLET UNDER TONGUE AS NEEDEDFOR CHEST PAIN. MAY REPEAT 5 MINUTES APART 3 TIMES IF NEEDED 25 tablet 3  . potassium chloride SA (K-DUR) 20 MEQ tablet Take 1 tablet (20 mEq total) by mouth daily. 90 tablet 1  . rosuvastatin (CRESTOR) 40 MG tablet Take 40 mg by mouth 3 (three) times a week.    . venlafaxine XR (EFFEXOR-XR) 150 MG 24 hr capsule TAKE 1 CAPSULE BY MOUTH ONCE DAILY WITH BREAKFAST. TAKE WITH 75 MG DOSE TO EQUAL 225MG  DAILY 90 capsule 1  . venlafaxine XR (EFFEXOR-XR) 75 MG 24 hr capsule TAKE 1 CAPSULE BY MOUTH ONCE DAILY. TAKEIN ADDITION TO THE 150 MG CAPSULE TO EQUAL A TOTAL DOSE OF 225 MG DAILY 90 capsule 1   No facility-administered medications prior to visit.     Per HPI unless specifically indicated in ROS section below Review of Systems  Constitutional: Negative for fatigue and fever.  HENT: Negative for ear pain.   Eyes: Negative for pain.  Respiratory: Negative for cough and shortness of breath.   Cardiovascular: Negative for chest pain, palpitations and leg swelling.  Gastrointestinal: Negative for abdominal pain.  Genitourinary: Negative for dysuria.  Musculoskeletal: Positive for back pain. Negative for arthralgias.  Neurological: Negative for syncope, light-headedness and headaches.  Psychiatric/Behavioral: Negative for dysphoric mood.   Objective:  BP 140/62   Pulse 84   Temp 98.1 F (36.7 C) (Temporal)   Ht 5' 5.5" (1.664 m)   Wt 288 lb 12 oz (131 kg)   SpO2 90%   BMI 47.32 kg/m   Wt Readings from Last 3 Encounters:  11/12/20 288  lb 12 oz (131 kg)  08/07/20 294 lb (133.4 kg)  04/21/20 291 lb (132 kg)      Physical Exam Constitutional:      Appearance: He is well-developed. He is obese.  HENT:     Head: Normocephalic.     Right Ear: Hearing normal.     Left Ear: Hearing normal.     Nose: Nose normal.  Neck:     Thyroid: No thyroid mass or thyromegaly.     Vascular: No carotid bruit.     Trachea: Trachea normal.  Cardiovascular:     Rate and Rhythm: Normal rate and regular rhythm.     Pulses: Normal  pulses.     Heart sounds: Heart sounds not distant. No murmur heard. No friction rub. No gallop.      Comments: No peripheral edema Pulmonary:     Effort: Pulmonary effort is normal. No respiratory distress.     Breath sounds: Normal breath sounds.  Musculoskeletal:     Lumbar back: Tenderness and bony tenderness present. Decreased range of motion.  Skin:    General: Skin is warm and dry.     Findings: No rash.  Psychiatric:        Speech: Speech normal.        Behavior: Behavior normal.        Thought Content: Thought content normal.       Diabetic foot exam: Normal inspection No skin breakdown No calluses  Normal DP pulses Decreased sensation to light touch and monofilament Nails normal  Results for orders placed or performed in visit on 11/02/20  Vitamin B12  Result Value Ref Range   Vitamin B-12 111 (L) 211 - 911 pg/mL  Comprehensive metabolic panel  Result Value Ref Range   Sodium 143 135 - 145 mEq/L   Potassium 4.5 3.5 - 5.1 mEq/L   Chloride 105 96 - 112 mEq/L   CO2 30 19 - 32 mEq/L   Glucose, Bld 144 (H) 70 - 99 mg/dL   BUN 14 6 - 23 mg/dL   Creatinine, Ser 8.92 0.40 - 1.50 mg/dL   Total Bilirubin 0.4 0.2 - 1.2 mg/dL   Alkaline Phosphatase 77 39 - 117 U/L   AST 18 0 - 37 U/L   ALT 14 0 - 53 U/L   Total Protein 7.4 6.0 - 8.3 g/dL   Albumin 3.9 3.5 - 5.2 g/dL   GFR 11.94 >17.40 mL/min   Calcium 9.1 8.4 - 10.5 mg/dL  Lipid panel  Result Value Ref Range   Cholesterol 107 0 -  200 mg/dL   Triglycerides 81.4 0.0 - 149.0 mg/dL   HDL 48.18 (L) >56.31 mg/dL   VLDL 49.7 0.0 - 02.6 mg/dL   LDL Cholesterol 56 0 - 99 mg/dL   Total CHOL/HDL Ratio 3    NonHDL 72.71   Hemoglobin A1c  Result Value Ref Range   Hgb A1c MFr Bld 7.7 (H) 4.6 - 6.5 %    This visit occurred during the SARS-CoV-2 public health emergency.  Safety protocols were in place, including screening questions prior to the visit, additional usage of staff PPE, and extensive cleaning of exam room while observing appropriate contact time as indicated for disinfecting solutions.   COVID 19 screen:  No recent travel or known exposure to COVID19 The patient denies respiratory symptoms of COVID 19 at this time. The importance of social distancing was discussed today.   Assessment and Plan   [name] preventative maintenance and recommended screening tests for an annual wellness exam were reviewed in full today. Brought up to date unless services declined.  Counselled on the importance of diet, exercise, and its role in overall health and mortality. [name] FH and SH was reviewed, including their home life, tobacco status, and drug and alcohol status.   Refused tetanus vaccine. uptodate with eye exam Colonoscopy 2011,.. he refused colonoscopy and cologuard. PS A indicated Recent Labs   Problem List Items Addressed This Visit    B12 deficiency (Chronic)     Start B12 1000 mcg  daily to every other day.       Chronic low back pain    Re-eval with  X-ray.  Start tramadol twice daily as needed for low back pain.. call for longer prescription if helping after 5 days.       Relevant Orders   DG Lumbar Spine Complete (Completed)   Diabetic retinopathy (HCC) (Chronic)     Followed by eye MD.      Hyperlipidemia associated with type 2 diabetes mellitus (HCC) (Chronic)    Stable, chronic.  Continue current medication.  LDL at goal on statin.Marland Kitchen on Crestor 40 mg daily and Zetia 10 mg  daily        Hypertension associated with diabetes (HCC) (Chronic)    Borderline control in office today on coreg, losartan, lasix, isosorbide      Hypertensive heart disease with heart failure (HCC) (Chronic)   Major depressive disorder, recurrent episode, moderate (HCC) (Chronic)    Stable, chronic.  Continue current medication.   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  Continue current medication.   Improved control on  Trulicity 3 mg weekly, Novolin70/30  70 Units/35 Units daily        Other Visit Diagnoses    Routine general medical examination at a health care facility    -  Primary   Diabetic polyneuropathy associated with type 2 diabetes mellitus (HCC)          Meds ordered this encounter  Medications  . DISCONTD: traMADol (ULTRAM) 50 MG tablet    Sig: Take 1 tablet (50 mg total) by mouth every 12 (twelve) hours as needed for up to 5 days.    Dispense:  10 tablet    Refill:  0   Orders Placed This Encounter  Procedures  . DG Lumbar Spine Complete    Standing Status:   Future    Number of Occurrences:   1    Standing Expiration Date:   11/12/2021    Order Specific Question:   Reason for Exam (SYMPTOM  OR DIAGNOSIS REQUIRED)    Answer:   low back pain    Order Specific Question:   Preferred imaging location?    Answer:   [facility]  . [facility] DI[facility]ETES FOOT EXAM    This external order was created through the Results Console.    Kerby Nora, MD

## 2020-11-17 ENCOUNTER — Other Ambulatory Visit: Payer: Self-pay | Admitting: Family Medicine

## 2020-11-17 MED ORDER — TRAMADOL HCL 50 MG PO TABS: 50.0000 mg | ORAL_TABLET | Freq: Two times a day (BID) | ORAL | 0 refills | Status: DC | PRN

## 2020-11-21 ENCOUNTER — Other Ambulatory Visit: Payer: Self-pay | Admitting: Family Medicine

## 2020-12-07 ENCOUNTER — Other Ambulatory Visit: Payer: Self-pay | Admitting: Family Medicine

## 2020-12-21 ENCOUNTER — Telehealth: Payer: Self-pay

## 2020-12-21 NOTE — Progress Notes (Addendum)
Chronic Care Management Pharmacy Assistant   Name: [name]  MRN: [id number] DOB: [birth year]  Reason for Encounter: Disease State- Hypertension and Diabetes   Recent office visits:  11/12/20- Dr. Kerby Nora- PCP- Started tramadol BID for lower back pain.   Recent consult visits:  None since last CCM contact.  Hospital visits:  None in previous 6 months  Medications: Outpatient Encounter Medications as of 12/21/2020  Medication Sig   aspirin 81 MG EC tablet Take 1 tablet (81 mg total) by mouth daily.   carvedilol (COREG) 6.25 MG tablet TAKE 1 TABLET BY MOUTH ONCE DAILY   cholecalciferol (VITAMIN D3) 25 MCG (1000 UNIT) tablet Take 2,000 Units by mouth daily.   clopidogrel (PLAVIX) 75 MG tablet TAKE 1 TABLET BY MOUTH ONCE DAILY WITH BREAKFAST   Continuous Blood Gluc Receiver (FREESTYLE LIBRE 14 DAY READER) DEVI 1 Device by Does not apply route every 14 (fourteen) days.   Continuous Blood Gluc Sensor (FREESTYLE LIBRE 14 DAY SENSOR) MISC USE TO CHECK BLOOD SUGAR AS DIRECTED. CHANGE EVERY 14 DAYS.   Dulaglutide (TRULICITY) 1.5 MG/0.5ML SOPN Inject 3 mg into the skin. Inject 3 mg once weekly (patient receives from [employer])   ezetimibe (ZETIA) 10 MG tablet TAKE 1 TABLET BY MOUTH ONCE DAILY   furosemide (LASIX) 40 MG tablet TAKE 1 TABLET BY MOUTH DAILY CAN TAKE A SECOND DAILY DOSE AS NEEDED   insulin NPH-regular Human (NOVOLIN 70/30) (70-30) 100 UNIT/ML injection Inject 70 units in the morning and 35 units at bedtime.   isosorbide mononitrate (IMDUR) 30 MG 24 hr tablet TAKE 1 TABLET BY MOUTH TWICE DAILY   losartan (COZAAR) 50 MG tablet Take 1 tablet (50 mg total) by mouth daily.   nitroGLYCERIN (NITROSTAT) 0.4 MG SL tablet DISSOLVE 1 TABLET UNDER TONGUE AS NEEDEDFOR CHEST PAIN. MAY REPEAT 5 MINUTES APART 3 TIMES IF NEEDED   potassium chloride SA (K-DUR) 20 MEQ tablet Take 1 tablet (20 mEq total) by mouth daily.   rosuvastatin (CRESTOR) 40 MG tablet Take 40 mg by mouth 3 (three)  times a week.   traMADol (ULTRAM) 50 MG tablet Take 1 tablet (50 mg total) by mouth every 12 (twelve) hours as needed.   venlafaxine XR (EFFEXOR-XR) 150 MG 24 hr capsule TAKE 1 CAPSULE BY MOUTH ONCE DAILY WITH BREAKFAST. TAKE WITH 75 MG DOSE TO EQUAL 225MG  DAILY   venlafaxine XR (EFFEXOR-XR) 75 MG 24 hr capsule TAKE 1 CAPSULE BY MOUTH ONCE DAILY. TAKEIN ADDITION TO THE 150 MG CAPSULE TO EQUAL A TOTAL DOSE OF 225 MG DAILY   No facility-administered encounter medications on file as of 12/21/2020.      Recent Relevant Labs: Lab Results  Component Value Date/Time   HGBA1C 7.7 (H) 11/02/2020 08:42 AM   HGBA1C 8.6 (H) 07/31/2020 08:41 AM   HGBA1C 9.6 10/02/2018 12:00 AM   MICROALBUR <0.7 02/21/2017 01:57 PM   MICROALBUR 0.8 02/16/2016 08:24 AM    Kidney Function Lab Results  Component Value Date/Time   CREATININE 1.05 11/02/2020 08:42 AM   CREATININE 1.00 07/31/2020 08:41 AM   GFR 71.68 11/02/2020 08:42 AM   GFRNONAA >60 04/02/2020 06:17 AM   GFRAA >60 04/02/2020 06:17 AM     Current antihyperglycemic regimen:  Trulicity 3 mg - Inject once weekly- gets from patient assistance Novolin 70/30 - Inject 70 units in the morning and 35 units at evening meal (30 minutes before meals)    Patient verbally confirms he is taking the above medications as directed.  Yes states if BG is around 90-95 he does not take the evening dose. If it is around 130 he only takes about 15 units.   What recent interventions/DTPs have been made to improve glycemic control:  Discussed he may need to reduce his evening Novolin by 3-5 units if continued nighttime lows. Recommend only taking insulin before meals, not after. Pick up some glucose tablets. Take 3-4 if any readings < 70. Patient will call if any BG < 70.  Have there been any recent hospitalizations or ED visits since last visit with CPP? No  Patient reports hypoglycemic symptoms, including blurry vision, fatigue and weakness when he has low  BG.  Patient denies hyperglycemic symptoms, including excessive thirst and polyuria  How often are you checking your blood sugar? twice daily  What are your blood sugars ranging?  Fasting: 101, 175, 130, 125 Before meals:  After meals: 220, 199, 200 Bedtime:    On insulin? Yes How many units:70 units in the morning and 35 units at evening meal (30 minutes before meals states if BG is around 90-95 he does not take the evening dose. If it is around 130 he only takes about 15 units)  During the week, how often does your blood glucose drop below 70? Twice a week  Are you checking your feet daily/regularly? Yes  Adherence Review: Is the patient currently on a STATIN medication? Yes Is the patient currently on ACE/ARB medication? Yes Does the patient have >5 day gap between last estimated fill dates? Yes - statin   Recent Office Vitals: BP Readings from Last 3 Encounters:  11/12/20 140/62  04/02/20 (!) 137/50  01/21/20 138/60   Pulse Readings from Last 3 Encounters:  11/12/20 76  04/02/20 79  01/21/20 79    Wt Readings from Last 3 Encounters:  11/12/20 288 lb 12 oz (131 kg)  08/07/20 294 lb (133.4 kg)  04/21/20 291 lb (132 kg)     Kidney Function Lab Results  Component Value Date/Time   CREATININE 1.05 11/02/2020 08:42 AM   CREATININE 1.00 07/31/2020 08:41 AM   GFR 71.68 11/02/2020 08:42 AM   GFRNONAA >60 04/02/2020 06:17 AM   GFRAA >60 04/02/2020 06:17 AM    BMP Latest Ref Rng & Units 11/02/2020 07/31/2020 04/14/2020  Glucose 70 - 99 mg/dL 229(N) 989(Q) 119(E)  BUN 6 - 23 mg/dL 14 19 17   Creatinine 0.40 - 1.50 mg/dL 1.74 0.81  Sodium 135 - 145 mEq/L 143 142 139  Potassium 3.5 - 5.1 mEq/L 4.5 4.4 4.0  Chloride 96 - 112 mEq/L 105 103 100  CO2 19 - 32 mEq/L 30 31 31   Calcium 8.4 - 10.5 mg/dL 9.1 9.1 9.4     Current antihypertensive regimen:  Carvedilol 6.25 mg 1 tablet daily Imdur 30 mg 1 tablet twice daily Losartan 50 mg 1 tablet daily  Patient verbally  confirms he is taking the above medications as directed. Yes  How often are you checking your Blood Pressure? Does not check.   Current home BP readings:  Does not check. States he does not have a monitor.  Wrist or arm cuff: N/A Caffeine intake: Rarely  Salt intake: Does not add salt to food. Does not like salt OTC medications including pseudoephedrine or NSAIDs? No  What recent interventions/DTPs have been made by any provider to improve Blood Pressure control since last CPP Visit:  None  Any recent hospitalizations or ED visits since last visit with CPP? No  What diet changes have  been made to improve Blood Pressure Control?  Denies any diet changes.   What exercise is being done to improve your Blood Pressure Control?   Has trouble exercising due to back pain. Tries to get around the house and get outside to walk around. He mows about 9 different yards.   Adherence Review: Is the patient currently on ACE/ARB medication? Yes Does the patient have >5 day gap between last estimated fill dates? Yes - statin   Star Rating Drugs:  Medication:  Last Fill: Day Supply Trulicity 3mg   Patient assistance Losartan 50 mg 10/19/20 90 Rosuvastatin 40 mg 07/28/20 90  Started taking B12 after PCP recommendation. He states he has not noticed much of a difference. He states he feels like the Trulicity has helped his sugars. He does not feel like the tramadol he got from his PCP helps much but he takes it sometimes.   Follow-Up:  Pharmacist Review  07/30/20, CPP notified  [name], [facility] Clinical Pharmacy Assistant [phone]   I have reviewed the care management and care coordination activities outlined in this encounter and I am certifying that I agree with the content of this note.   [phone], PharmD Clinical Pharmacist [facility] at [facility] [phone]

## 2020-12-23 ENCOUNTER — Ambulatory Visit: Payer: HMO | Admitting: Cardiovascular Disease

## 2020-12-26 NOTE — Assessment & Plan Note (Signed)
Start B12 1000 mcg  daily to every other day.

## 2020-12-26 NOTE — Assessment & Plan Note (Signed)
Stable, chronic.  Continue current medication.   Improved control on  Trulicity 3 mg weekly, Novolin70/30  70 Units/35 Units daily

## 2020-12-26 NOTE — Assessment & Plan Note (Signed)
Followed by eye MD. 

## 2020-12-26 NOTE — Assessment & Plan Note (Signed)
Stable, chronic.  Continue current medication.  LDL at goal on statin.Marland Kitchen on Crestor 40 mg daily and Zetia 10 mg daily

## 2020-12-26 NOTE — Assessment & Plan Note (Signed)
Stable, chronic.  Continue current medication.   venlafaxine 215 mg daily

## 2020-12-26 NOTE — Assessment & Plan Note (Signed)
Borderline control in office today on coreg, losartan, lasix, isosorbide

## 2020-12-26 NOTE — Assessment & Plan Note (Signed)
Re-eval with X-ray.  Start tramadol twice daily as needed for low back pain.. call for longer prescription if helping after 5 days.

## 2020-12-29 ENCOUNTER — Other Ambulatory Visit: Payer: Self-pay | Admitting: Family Medicine

## 2020-12-29 NOTE — Telephone Encounter (Signed)
Last office visit 11/12/2020 for CPE.  Last refilled 11/17/2020 for #60 with no refills.  Next Appt: 02/12/2021 for 3 month follow up.

## 2020-12-31 ENCOUNTER — Ambulatory Visit (INDEPENDENT_AMBULATORY_CARE_PROVIDER_SITE_OTHER): Payer: HMO

## 2020-12-31 ENCOUNTER — Telehealth: Payer: Self-pay

## 2020-12-31 ENCOUNTER — Other Ambulatory Visit: Payer: Self-pay

## 2020-12-31 DIAGNOSIS — E785 Hyperlipidemia, unspecified: Secondary | ICD-10-CM

## 2020-12-31 DIAGNOSIS — E1139 Type 2 diabetes mellitus with other diabetic ophthalmic complication: Secondary | ICD-10-CM | POA: Diagnosis not present

## 2020-12-31 DIAGNOSIS — E1169 Type 2 diabetes mellitus with other specified complication: Secondary | ICD-10-CM | POA: Diagnosis not present

## 2020-12-31 DIAGNOSIS — E1165 Type 2 diabetes mellitus with hyperglycemia: Secondary | ICD-10-CM | POA: Diagnosis not present

## 2020-12-31 DIAGNOSIS — I152 Hypertension secondary to endocrine disorders: Secondary | ICD-10-CM

## 2020-12-31 DIAGNOSIS — E1159 Type 2 diabetes mellitus with other circulatory complications: Secondary | ICD-10-CM | POA: Diagnosis not present

## 2020-12-31 DIAGNOSIS — IMO0002 Reserved for concepts with insufficient information to code with codable children: Secondary | ICD-10-CM

## 2020-12-31 NOTE — Patient Instructions (Signed)
Dear [name],  Below is a summary of the goals we discussed during our follow up appointment on Dec 31, 2020. Please contact me anytime with questions or concerns.   Visit Information   Patient Care Plan: CCM Pharmacy Care Plan    Problem Identified: CHL AMB "PATIENT-SPECIFIC PROBLEM"     Long-Range Goal: Patient Stated   Start Date: 12/31/2020  Note:     Current Barriers:  . Unable to achieve control of diabetes  Pharmacist Clinical Goal(s):  Marland Kitchen Over the next 30 days, patient will achieve control of diabetes as evidence by A1c and home BG monitoring through collaboration with PharmD and provider.   Interventions: . 1:1 collaboration with William Sanders, MD regarding development and update of comprehensive plan of care as evidenced by provider attestation and co-signature . Inter-disciplinary care team collaboration (see longitudinal plan of care) . Comprehensive medication review performed; medication list updated in electronic medical record  Diabetes (A1c goal <7%) -Not ideally controlled - A1c 7.7% improved from 8.6% -Current medications: . Trulicity 3 mg - Inject once weekly  Novolin 70/30 - Inject 70 units in the morning and 35 units at evening meal (30 minutes before meals) - Pt taking differently - 20-30 units in evening due to concern for hypoglycemia -Medications previously tried:  metformin -Current home glucose readings - using libre, scanning 10-15 times daily . fasting glucose: 90-135 . After meals 200-250 . Hypoglycemia: Reports one low < 70 this month, had crackers and peanut butter. This was caused by skipped meal.  -Adherence: Reports improvement in taking insulin before meals instead of afterwards. Takes Trulicity 3 mg every Thursday -Current meal patterns: eats 2 meals daily, low appetite -We discussed transitioning Novolin 70/30 to basal bolus as patient needs higher bolus dose and is on max basal dose. He is happy to accept any changes that will help his  BG control. He would like to have better control of his post-prandial readings. He also is open to increasing Trulicity. -Consider switching to basal/bolus regimen and increase Trulicity to 4.5 mg weekly. See telephone note - Consult PCP.  Hypertension (BP goal <140/90) -Controlled - per clinic readings within goal  -Current treatment: ? Carvedilol 6.25 mg 1 tablet daily ? Imdur 30 mg 1 tablet twice daily ? Losartan 50 mg 1 tablet daily -Medications previously tried: none -Current home readings: none - does not have a monitor -Current dietary habits:  Caffeine intake: Rarely   Salt intake: Does not add salt to food. Does not like salt  OTC medications including pseudoephedrine or NSAIDs? No -Current exercise habits: Has trouble exercising due to back pain. Tries to get around the house and get outside to walk around. He mows about 9 different yards.  -Denies hypotensive/hypertensive symptoms -Educated on BP goals and benefits of medications for prevention of heart attack, stroke and kidney damage; Important of daily adherence -Recommended to continue current medication  Hyperlipidemia: (LDL goal < 70) -Controlled - LDL 56 -Current treatment: . Crestor 40 mg - 1 tablet three days a week (patient takes 1 tablet every 3-4 days) -Medications previously tried: Daily Crestor - unable to tolerate -Educated on Cholesterol goals;  -Recommended to continue current medication  Depression/Anxiety (Goal: improve symptoms) -Controlled - per patient report -Primary complaint is low energy, unclear if from depressed mood or diabetes, labs normal. Fatigue as improved some with B12 supplementation  -Current treatment: . Venlafaxine XR 75 and 150 mg (225 mg) daily -Medications previously tried/failed: none reported -Recommended to continue  current medication  Patient Goals/Self-Care Activities . Over the next 30 days, patient will:  - take medications as prescribed   Star Rating Drugs:   Medication:                Last Fill:         Day Supply Trulicity 87m                Patient assistance Losartan 50 mg           10/19/20            90 Rosuvastatin 40 mg    07/28/20          90  Patient has surplus rosuvastatin due to prior on daily dosing, now taking 2-3 days per week.  Follow Up Plan: The care management team will reach out to the patient again over the next 30 days.        The patient verbalized understanding of instructions, educational materials, and care plan provided today and agreed to receive a mailed copy of patient instructions, educational materials, and care plan.    [name] PharmD Clinical Pharmacist [phone]  Preventing Hypoglycemia Hypoglycemia occurs when the level of sugar (glucose) in the blood is too low. Hypoglycemia can happen in people who do or do not have diabetes (diabetes mellitus). It can develop quickly, and it can be a medical emergency. For most people with diabetes, a blood glucose level below 70 mg/dL (3.9 mmol/L) is considered hypoglycemia. Glucose is a type of sugar that provides the body's main source of energy. Certain hormones (insulin and glucagon) control the level of glucose in the blood. Insulin lowers blood glucose, and glucagon increases blood glucose. Hypoglycemia can result from having too much insulin in the bloodstream, or from not eating enough food that contains glucose. Your risk for hypoglycemia is higher:  If you take insulin or diabetes medicines to help lower your blood glucose or help your body make more insulin.  If you skip or delay a meal or snack.  If you are ill.  During and after exercise. You can prevent hypoglycemia by working with your health care provider to adjust your meal plan as needed and by taking other precautions. How can hypoglycemia affect me? Mild symptoms Mild hypoglycemia may not cause any symptoms. If you do have symptoms, they may  include:  Hunger.  Anxiety.  Sweating and feeling clammy.  Dizziness or feeling light-headed.  Sleepiness.  Nausea.  Increased heart rate.  Headache.  Blurry vision.  Irritability.  Tingling or numbness around the mouth, lips, or tongue.  A change in coordination.  Restless sleep. If mild hypoglycemia is not recognized and treated, it can quickly become moderate or severe hypoglycemia. Moderate symptoms Moderate hypoglycemia can cause:  Mental confusion and poor judgment.  Behavior changes.  Weakness.  Irregular heartbeat. Severe symptoms Severe hypoglycemia is a medical emergency. It can cause:  Fainting.  Seizures.  Loss of consciousness (coma).  Death. What nutrition changes can be made?  Work with your health care provider or diet and nutrition specialist (dietitian) to make a healthy meal plan that is right for you. Follow your meal plan carefully.  Eat meals at regular times.  If recommended by your health care provider, have snacks between meals.  Donot skip or delay meals or snacks. You can be at risk for hypoglycemia if you are not getting enough carbohydrates. What lifestyle changes can be made?  Work closely with your health care provider to manage your blood glucose. Make sure you know: ? Your goal blood glucose levels. ? How and when to check your blood glucose. ? The symptoms of hypoglycemia. It is important to treat it right away to keep it from becoming severe.  Do not drink alcohol on an empty stomach.  When you are ill, check your blood glucose more often than usual. Follow your sick day plan whenever you cannot eat or drink normally. Make this plan in advance with your health care provider.  Always check your blood glucose before, during, and after exercise.   How is this treated? This condition can often be treated by immediately eating or drinking something that contains sugar, such as:  Fruit juice, 4-6 oz (120-150  mL).  Regular (not diet) soda, 4-6 oz (120-150 mL).  Low-fat milk, 4 oz (120 mL).  Several pieces of hard candy.  Sugar or honey, 1 Tbsp (15 mL). Treating hypoglycemia if you have diabetes If you are alert and able to swallow safely, follow the 15:15 rule:  Take 15 grams of a rapid-acting carbohydrate. Talk with your health care provider about how much you should take.  Rapid-acting options include: ? Glucose pills (take 15 grams). ? 6-8 pieces of hard candy. ? 4-6 oz (120-150 mL) of fruit juice. ? 4-6 oz (120-150 mL) of regular (not diet) soda.  Check your blood glucose 15 minutes after you take the carbohydrate.  If the repeat blood glucose level is still at or below 70 mg/dL (3.9 mmol/L), take 15 grams of a carbohydrate again.  If your blood glucose level does not increase above 70 mg/dL (3.9 mmol/L) after 3 tries, seek emergency medical care.  After your blood glucose level returns to normal, eat a meal or a snack within 1 hour. Treating severe hypoglycemia Severe hypoglycemia is when your blood glucose level is at or below 54 mg/dL (3 mmol/L). Severe hypoglycemia is a medical emergency. Get medical help right away. If you have severe hypoglycemia and you cannot eat or drink, you may need an injection of glucagon. A family member or close friend should learn how to check your blood glucose and how to give you a glucagon injection. Ask your health care provider if you need to have an emergency glucagon injection kit available. Severe hypoglycemia may need to be treated in a hospital. The treatment may include getting glucose through an IV. You may also need treatment for the cause of your hypoglycemia. Where to find more information  American Diabetes Association: www.diabetes.[employer] of Diabetes and Digestive and Kidney Diseases: [employer] Contact a health care provider if:  You have problems keeping your blood glucose in your target range.  You  have frequent episodes of hypoglycemia. Get help right away if:  You continue to have hypoglycemia symptoms after eating or drinking something containing glucose.  Your blood glucose level is at or below 54 mg/dL (3 mmol/L).  You faint.  You have a seizure. These symptoms may represent a serious problem that is an emergency. Do not wait to see if the symptoms will go away. Get medical help right away. Call your local emergency services (911 in the [place] Summary  Know the symptoms of hypoglycemia, and when you are at risk for it (such as during exercise or when you are sick). Check your blood glucose often when you are at risk for hypoglycemia.  Hypoglycemia can develop quickly, and it can be dangerous if it  is not treated right away. If you have a history of severe hypoglycemia, make sure you know how to use your glucagon injection kit.  Make sure you know how to treat hypoglycemia. Keep a carbohydrate snack available when you may be at risk for hypoglycemia. This information is not intended to replace advice given to you by your health care provider. Make sure you discuss any questions you have with your health care provider. Document Revised: 12/07/2018 Document Reviewed: 04/12/2017 Elsevier Patient Education  2021 [employer].

## 2020-12-31 NOTE — Telephone Encounter (Signed)
Consult to switch patient's insulin from premixed, Novolin 70/30 to basal-bolus regimen. Current dose per patient is 70 units before breakfast and 20-30 units before evening meal Would like to see if he does better on basal/bolus regimen. He is still having post-prandials in 200-250 range but occasional low fasting readings.   Recommend: -Stop Novolin 70/30 - 70 units before breakfast and 20-30 units before evening meal -Start Insulin degludec [name]) 50 units daily (20% dose reduction - current dose Insulin NPH 63 units) -Start Insulin aspart (Novolog)10 units before breakfast and 5 units before evening meal (40% dose reduction - current dose regular Insulin 21 units AM and 6-9 units PM) -Increase Trulicity to 4.5 mg weekly (through patient assistance)  Let me know your thoughts. Patient would like to check price at pharmacy first and if not covered, apply for patient assistance.  [name], PharmD Clinical Pharmacist [facility] at [facility] [phone]

## 2020-12-31 NOTE — Progress Notes (Signed)
Chronic Care Management Pharmacy Note  12/31/2020 Name:  CORI HENNINGSEN MRN:  [id number] DOB:  [birth year]  Subjective: [name] is an 70 y.o. year old male who is a primary patient of Bedsole, Amy E, MD.  The CCM team was consulted for assistance with disease management and care coordination needs.    Engaged with patient by telephone for follow up visit in response to provider referral for pharmacy case management and/or care coordination services.   Consent to Services:  The patient was given information about Chronic Care Management services, agreed to services, and gave verbal consent prior to initiation of services.  Please see initial visit note for detailed documentation.   Patient Care Team: Jinny Sanders, MD as PCP - General Rockey Situ Kathlene November, MD as Consulting Physician (Cardiology) Debbora Dus, [facility] as Pharmacist (Pharmacist)  Recent office visits:  11/12/20- Dr. [name]- PCP- Started tramadol BID for lower back pain. Start B12 1000 mcg daily.  Recent consult visits:  None since last CCM contact.  Hospital visits:  None in previous 6 months  Objective:  Lab Results  Component Value Date   CREATININE 1.05 11/02/2020   BUN 14 11/02/2020   GFR 71.68 11/02/2020   GFRNONAA >60 04/02/2020   GFRAA >60 04/02/2020   NA 143 11/02/2020   K 4.5 11/02/2020   CALCIUM 9.1 11/02/2020   CO2 30 11/02/2020    Lab Results  Component Value Date/Time   HGBA1C 7.7 (H) 11/02/2020 08:42 AM   HGBA1C 8.6 (H) 07/31/2020 08:41 AM   HGBA1C 9.6 10/02/2018 12:00 AM   GFR 71.68 11/02/2020 08:42 AM   GFR 76.14 07/31/2020 08:41 AM   MICROALBUR <0.7 02/21/2017 01:57 PM   MICROALBUR 0.8 02/16/2016 08:24 AM    Last diabetic Eye exam:  Lab Results  Component Value Date/Time   HMDIABEYEEXA Retinopathy (A) 12/11/2019 12:00 AM    Last diabetic Foot exam:  Lab Results  Component Value Date/Time   HMDIABFOOTEX done 11/12/2020 12:00 AM     Lab Results  Component Value Date    CHOL 107 11/02/2020   HDL 34.30 (L) 11/02/2020   LDLCALC 56 11/02/2020   LDLDIRECT 174.0 10/19/2017   TRIG 82.0 11/02/2020   CHOLHDL 3 11/02/2020    Hepatic Function Latest Ref Rng & Units 11/02/2020 07/31/2020 04/14/2020  Total Protein 6.0 - 8.3 g/dL 7.4 6.7 7.2  Albumin 3.5 - 5.2 g/dL 3.9 3.9 4.0  AST 0 - 37 U/L _0 ALT 0 - 53 U/L _1 Alk Phosphatase 39 - 117 U/L 77 85 85  Total Bilirubin 0.2 - 1.2 mg/dL 0.4 0.4 0.3  Bilirubin, Direct 0.0 - 0.3 mg/dL - - -    Lab Results  Component Value Date/Time   TSH 2.64 10/19/2017 04:00 PM   TSH 1.14 03/11/2014 10:26 AM   FREET4 0.79 10/19/2017 04:00 PM    CBC Latest Ref Rng & Units 07/31/2020 03/31/2020 10/18/2018  WBC 4.0 - 10.5 K/uL 6.6 7.0 7.3  Hemoglobin 13.0 - 17.0 g/dL 13.2 13.4 13.5  Hematocrit 39.0 - 52.0 % 40.2 41.0 39.8  Platelets 150.0 - 400.0 K/uL 198.0 224 218.0    Lab Results  Component Value Date/Time   VD25OH 37.72 07/31/2020 08:41 AM   VD25OH 47.26 02/27/2018 09:26 AM    Clinical ASCVD: Yes  The ASCVD Risk score (Goff DC Jr., et al., 2013) failed to calculate for the following reasons:   The patient has a prior MI or stroke  diagnosis    Depression screen [facility] 2/9 11/10/2020 06/12/2020 01/30/2020  Decreased Interest _0 Down, Depressed, Hopeless _1 PHQ - 2 Score _2 Altered sleeping 0 3 3  Tired, decreased energy 0 3 3  Change in appetite 0 2 0  Feeling bad or failure about yourself  0 0 0  Trouble concentrating 0 2 0  Moving slowly or fidgety/restless 0 2 0  Suicidal thoughts 0 0 0  PHQ-9 Score _3 Difficult doing work/chores Not difficult at all Extremely dIfficult Extremely dIfficult  Some recent data might be hidden    Social History   Tobacco Use  Smoking Status Never Smoker  Smokeless Tobacco Never Used   BP Readings from Last 3 Encounters:  11/12/20 140/62  04/02/20 (!) 137/50  01/21/20 138/60   Pulse Readings from Last 3 Encounters:  11/12/20 76  04/02/20 79   01/21/20 79   Wt Readings from Last 3 Encounters:  11/12/20 288 lb 12 oz (131 kg)  08/07/20 294 lb (133.4 kg)  04/21/20 291 lb (132 kg)    Assessment/Interventions: Review of patient past medical history, allergies, medications, health status, including review of consultants reports, laboratory and other test data, was performed as part of comprehensive evaluation and provision of chronic care management services.   SDOH:  (Social Determinants of Health) assessments and interventions performed: Yes - Trulicity PAP active    CCM Care Plan  Allergies  Allergen Reactions  . Atorvastatin Other (See Comments)    Body aches    Medications Reviewed Today    Reviewed by Debbora Dus, [facility] (Pharmacist) on 12/31/20 at [place] List Status: <None>  Medication Order Taking? Sig Documenting Provider Last Dose Status Informant  aspirin 81 MG EC tablet [id number] Yes Take 1 tablet (81 mg total) by mouth daily. Theora Gianotti, NP Taking Active Self           Med Note Pilar Plate May 01, 2016  3:28 PM)    carvedilol (COREG) 6.25 MG tablet [id number] Yes TAKE 1 TABLET BY MOUTH ONCE DAILY Gollan, Kathlene November, MD Taking Active   cholecalciferol (VITAMIN D3) 25 MCG (1000 UNIT) tablet [id number] Yes Take 2,000 Units by mouth daily. [provider] Taking Active   clopidogrel (PLAVIX) 75 MG tablet [id number] Yes TAKE 1 TABLET BY MOUTH ONCE DAILY WITH BREAKFAST Bedsole, Amy E, MD Taking Active   Continuous Blood Gluc Receiver (FREESTYLE LIBRE 14 DAY READER) [place] [id number] Yes 1 Device by Does not apply route every 14 (fourteen) days. Jinny Sanders, MD Taking Active Self  Continuous Blood Gluc Sensor (FREESTYLE LIBRE 14 DAY SENSOR) [place] [id number] Yes USE TO CHECK BLOOD SUGAR AS DIRECTED. CHANGE EVERY 14 DAYS. Jinny Sanders, MD Taking Active   Dulaglutide (TRULICITY) 1.5 ZD/6.6YQ SOPN [id number] Yes Inject 3 mg into the skin. Inject 3 mg once weekly (patient receives from  [employer]) [provider] Taking Active Self  ezetimibe (ZETIA) 10 MG tablet [id number] Yes TAKE 1 TABLET BY MOUTH ONCE DAILY Gollan, Kathlene November, MD Taking Active   furosemide (LASIX) 40 MG tablet [id number] Yes TAKE 1 TABLET BY MOUTH DAILY CAN TAKE A SECOND DAILY DOSE AS NEEDED Bedsole, Amy E, MD Taking Active   insulin NPH-regular Human (NOVOLIN 70/30) (70-30) 100 UNIT/ML injection [id number] Yes Inject 70 units in the morning and 35 units at bedtime. Jinny Sanders, MD Taking Active   isosorbide mononitrate (IMDUR) 30  MG 24 hr tablet [id number] Yes TAKE 1 TABLET BY MOUTH TWICE DAILY Gollan, Kathlene November, MD Taking Active   losartan (COZAAR) 50 MG tablet [id number] Yes Take 1 tablet (50 mg total) by mouth daily. Jinny Sanders, MD Taking Active Self  nitroGLYCERIN (NITROSTAT) 0.4 MG SL tablet [id number] Yes DISSOLVE 1 TABLET UNDER TONGUE AS NEEDEDFOR CHEST PAIN. MAY REPEAT 5 MINUTES APART 3 TIMES IF NEEDED Gollan, Kathlene November, MD Taking Active Self  potassium chloride SA (K-DUR) 20 MEQ tablet [id number] Yes Take 1 tablet (20 mEq total) by mouth daily. Jinny Sanders, MD Taking Active   rosuvastatin (CRESTOR) 40 MG tablet [id number] Yes Take 40 mg by mouth 3 (three) times a week. [provider] Taking Active   traMADol (ULTRAM) 50 MG tablet [id number] Yes TAKE 1 TABLET BY MOUTH EVERY 12 HOURS ASNEEDED Bedsole, Amy E, MD Taking Active   venlafaxine XR (EFFEXOR-XR) 150 MG 24 hr capsule [id number] Yes TAKE 1 CAPSULE BY MOUTH ONCE DAILY WITH BREAKFAST. TAKE WITH 75 MG DOSE TO EQUAL $Remove'225MG'TivTyzL$  DAILY Bedsole, Amy E, MD Taking Active Self  venlafaxine XR (EFFEXOR-XR) 75 MG 24 hr capsule [id number] Yes TAKE 1 CAPSULE BY MOUTH ONCE DAILY. TAKEIN ADDITION TO THE 150 MG CAPSULE TO EQUAL A TOTAL DOSE OF 225 MG DAILY Bedsole, Amy E, MD Taking Active Self  vitamin B-12 (CYANOCOBALAMIN) 1000 MCG tablet [id number] Yes Take 1,000 mcg by mouth daily. [provider] Taking Active Self          Patient Active  Problem List   Diagnosis Date Noted  . Chronic low back pain 11/12/2020  . Hyperglycemia due to type 2 diabetes mellitus ([place]) 03/31/2020  . ED (erectile dysfunction) 03/06/2018  . Acute on chronic diastolic CHF (congestive heart failure) ([place]) 09/20/2016  . Adjustment disorder with mixed anxiety and depressed mood 09/09/2016  . Gastroesophageal reflux disease 09/09/2016  . Coronary artery disease, non-occlusive   . Elevated left ventricular end-diastolic pressure (LVEDP) 03/11/2016  . Anemia 03/11/2016  . Hypertensive heart disease with heart failure ([place])   . Uncontrolled type 2 diabetes with eye complications ([place])   . Coronary artery disease of native artery of native heart with stable angina pectoris ([place])   . Morbid obesity ([place])   . Chronic chest pain   . NSTEMI (non-ST elevated myocardial infarction) ([place])   . Advanced care planning/counseling discussion 11/20/2015  . Noncompliance with diabetes treatment 11/05/2015  . Noncompliance with diet and medication regimen 11/05/2015  . Diabetic retinopathy ([place]) 09/23/2014  . Snoring 12/21/2012  . HYPOGONADISM 09/28/2010  . B12 deficiency 09/28/2010  . Vitamin D deficiency 09/28/2010  . OTHER MALAISE AND FATIGUE 09/17/2010  . TRIGGER FINGER, RIGHT MIDDLE 09/14/2010  . [place] OCCLUSION 11/26/2009  . CONSTIPATION 08/10/2009  . GASTRITIS 07/29/2009  . Major depressive disorder, recurrent episode, moderate ([place]) 07/06/2007  . ERECTILE DYSFUNCTION 04/10/2007  . RENAL CALCULUS, HX OF 03/26/2007  . Hyperlipidemia associated with type 2 diabetes mellitus ([place]) 12/05/2006  . Hypertension associated with diabetes ([place]) 12/05/2006  . Osteoarthritis 12/05/2006    Immunization History  Administered Date(s) Administered  . Fluad Quad(high Dose 65+) 05/23/2020  . Influenza Split 05/12/2011, 07/09/2012  . Influenza Whole 06/30/2007, 05/29/2008, 07/03/2009  . Influenza, High Dose Seasonal PF 05/14/2019  . Influenza,inj,Quad  PF,6+ Mos 05/31/2013, 07/09/2014, 06/25/2015, 07/05/2016, 06/22/2017, 06/11/2018  . PFIZER(Purple Top)SARS-COV-2 Vaccination 10/28/2019, 11/18/2019, 05/29/2020  . Pneumococcal Conjugate-13 02/14/2015  . Pneumococcal Polysaccharide-23 06/30/2007, 12/21/2012, 02/27/2018  . Td 06/30/2007    Conditions  to be addressed/monitored:  Hypertension, Hyperlipidemia, Diabetes and Depression  Patient Care Plan: CCM Pharmacy Care Plan    Problem Identified: CHL AMB "PATIENT-SPECIFIC PROBLEM"     Long-Range Goal: Patient Stated   Start Date: 12/31/2020  Note:     Current Barriers:  . Unable to achieve control of diabetes  Pharmacist Clinical Goal(s):  Marland Kitchen Over the next 30 days, patient will achieve control of diabetes as evidence by A1c and home BG monitoring through collaboration with PharmD and provider.   Interventions: . 1:1 collaboration with Jinny Sanders, MD regarding development and update of comprehensive plan of care as evidenced by provider attestation and co-signature . Inter-disciplinary care team collaboration (see longitudinal plan of care) . Comprehensive medication review performed; medication list updated in electronic medical record  Diabetes (A1c goal <7%) -Not ideally controlled - A1c 7.7% improved from 8.6% -Current medications: . Trulicity 3 mg - Inject once weekly  Novolin 70/30 - Inject 70 units in the morning and 35 units at evening meal (30 minutes before meals) - Pt taking differently - 20-30 units in evening due to concern for hypoglycemia -Medications previously tried:  metformin -Current home glucose readings - using libre, scanning 10-15 times daily . fasting glucose: 90-135 . After meals 200-250 . Hypoglycemia: Reports one low < 70 this month, had crackers and peanut butter. This was caused by skipped meal.  -Adherence: Reports improvement in taking insulin before meals instead of afterwards. Takes Trulicity 3 mg every Thursday -Current meal patterns: eats 2  meals daily, low appetite -We discussed transitioning Novolin 70/30 to basal bolus as patient needs higher bolus dose and is on max basal dose. He is happy to accept any changes that will help his BG control. He would like to have better control of his post-prandial readings. He also is open to increasing Trulicity. -Consider switching to basal/bolus regimen and increase Trulicity to 4.5 mg weekly. See telephone note - Consult PCP.  Hypertension (BP goal <140/90) -Controlled - per clinic readings within goal  -Current treatment: ? Carvedilol 6.25 mg 1 tablet daily ? Imdur 30 mg 1 tablet twice daily ? Losartan 50 mg 1 tablet daily -Medications previously tried: none -Current home readings: none - does not have a monitor -Current dietary habits:  Caffeine intake: Rarely   Salt intake: Does not add salt to food. Does not like salt  OTC medications including pseudoephedrine or NSAIDs? No -Current exercise habits: Has trouble exercising due to back pain. Tries to get around the house and get outside to walk around. He mows about 9 different yards.  -Denies hypotensive/hypertensive symptoms -Educated on BP goals and benefits of medications for prevention of heart attack, stroke and kidney damage; Important of daily adherence -Recommended to continue current medication  Hyperlipidemia: (LDL goal < 70) -Controlled - LDL 56 -Current treatment: . Crestor 40 mg - 1 tablet three days a week (patient takes 1 tablet every 3-4 days) -Medications previously tried: Daily Crestor - unable to tolerate -Educated on Cholesterol goals;  -Recommended to continue current medication  Depression/Anxiety (Goal: improve symptoms) -Controlled - per patient report -Primary complaint is low energy, unclear if from depressed mood or diabetes, labs normal. Fatigue as improved some with B12 supplementation  -Current treatment: . Venlafaxine XR 75 and 150 mg (225 mg) daily -Medications previously tried/failed:  none reported -Recommended to continue current medication  Patient Goals/Self-Care Activities . Over the next 30 days, patient will:  - take medications as prescribed   Star Rating Drugs:  Medication:                Last Fill:         Day Supply Trulicity 69m                Patient assistance Losartan 50 mg           10/19/20            90 Rosuvastatin 40 mg    07/28/20          90  Patient has surplus rosuvastatin due to prior on daily dosing, now taking 2-3 days per week.  Follow Up Plan: The care management team will reach out to the patient again over the next 30 days.       Medication Assistance: [facility]ulicity [facility].  Enrollment ends 08/28/2021  [id number]: [phone]: [phone] Uses pill box? Yes  Care Plan and Follow Up Patient Decision:  Patient agrees to Care Plan and Follow-up.  MDebbora Dus PharmD Clinical Pharmacist [phone]

## 2021-01-01 NOTE — Telephone Encounter (Signed)
I think that schedule sounds fine... do we wait to hear from him or can you enter rx for my signature to send.

## 2021-01-01 NOTE — Progress Notes (Signed)
Encounter details: CCM Time Spent       Value Time User   Time spent with patient (minutes)  70 12/31/2020 10:51 PM [name], [facility]   Time spent performing Chart review  30 12/31/2020 10:51 PM [name], [facility]   Total time (minutes)  100 12/31/2020 10:51 PM [name], RPH      Moderate to High Complex Decision Making       Value Time User   Moderate to High complex decision making  Yes 12/31/2020 10:51 PM [name], [facility]      CCM Services: This encounter meets complex CCM services and moderate to high decision making.  Prior to outreach and patient consent for Chronic Care Management, I referred this patient for services after reviewing the nominated patient list or from a personal encounter with the patient.  I have personally reviewed this encounter including the documentation in this note and have collaborated with the care management provider regarding care management and care coordination activities to include development and update of the comprehensive care plan. I am certifying that I agree with the content of this note and encounter as supervising physician.

## 2021-01-05 MED ORDER — NOVOLOG FLEXPEN 100 UNIT/ML ~~LOC~~ SOPN: PEN_INJECTOR | SUBCUTANEOUS | 5 refills | Status: DC

## 2021-01-05 MED ORDER — INSULIN DEGLUDEC 100 UNIT/ML ~~LOC~~ SOPN: 50.0000 [IU] | PEN_INJECTOR | Freq: Every day | SUBCUTANEOUS | 5 refills | Status: DC

## 2021-01-05 NOTE — Telephone Encounter (Signed)
Prescriptions sent to [facility].

## 2021-01-05 NOTE — Telephone Encounter (Signed)
Patient is agreeable to changes. I don't have access to order prescriptions yet. Will send to [facility] to order rx's for insulin. I will call [facility] for Trulicity dose increase and see if fax is needed.   -Start Insulin degludec [name]) 50 units daily  -Start Insulin aspart (NovoLog Flexpen) 10 units before breakfast and 5 units before evening meal  (Please include on both prescriptions - *replaces Novolin 70/30*)  [name], PharmD Clinical Pharmacist [place] [facility] at [facility] [phone]

## 2021-01-05 NOTE — Telephone Encounter (Signed)
Prescription for Trulicity 4.5 mg - Inject  once weekly, 4 month supply with 2 refills called to [facility].

## 2021-01-08 NOTE — Telephone Encounter (Signed)
Patient called back and new insulin is no copayment. It will be delivered today. He will start [place] and [employer]. Take Novolin as prescribed today, then stop and do not take any more doses. Patient confirms understanding. Follow up next week to review.  [name], PharmD Clinical Pharmacist [place] [facility] at [facility] [phone]

## 2021-01-08 NOTE — Telephone Encounter (Signed)
Contacted patient and explained changes. Patient confirms understanding. He plans to call his pharmacy to schedule a delivery. He will call if any questions.  [name], PharmD Clinical Pharmacist [place] [facility] at [facility] [phone]

## 2021-01-11 ENCOUNTER — Telehealth: Payer: Self-pay

## 2021-01-11 ENCOUNTER — Other Ambulatory Visit: Payer: Self-pay | Admitting: Family Medicine

## 2021-01-11 MED ORDER — PEN NEEDLES 31G X 6 MM MISC: 1.0000 | 12 refills | Status: DC

## 2021-01-11 NOTE — Telephone Encounter (Signed)
Fax received that pt needed pen needles

## 2021-01-12 ENCOUNTER — Other Ambulatory Visit: Payer: Self-pay | Admitting: Family Medicine

## 2021-01-12 MED ORDER — PEN NEEDLES 31G X 6 MM MISC: 1.0000 | 12 refills | Status: DC

## 2021-01-14 ENCOUNTER — Telehealth: Payer: Self-pay

## 2021-01-14 ENCOUNTER — Other Ambulatory Visit: Payer: Self-pay | Admitting: Family Medicine

## 2021-01-14 NOTE — Telephone Encounter (Signed)
Contacted patient to review BG log and recent change in insulin.  He is taking Tresiba 50 units every morning and Novolog 10 units before breakfast and 5 units before evening meal. He increased Trulicity to 4.5 mg weekly today. He is checking BG throughout the day with his CGM. He is concerned about elevated BG readings. Denies any hypoglycemia. He started new insulin on Tuesday 01/12/21.  Home BG monitoring:  Fasting BG - 180s  After meals - 300-350  Plan: (pending PCP approval) -Increase Tresiba to 55 units daily (10%) -Increase Novolog by 1-2 units prior to meals until post prandial BG < 200 -Call [place] or office with any concerns or BG < 70 -Follow up on Monday, 01/18/21 for BG log review  [name], PharmD Clinical Pharmacist [place] [facility] at [facility] [phone]

## 2021-01-14 NOTE — Telephone Encounter (Signed)
Pt left v/m that since his insulin was changed pts BS is high. I spoke with pt; pt said since he had left the v/m he has spoken with Marcelino Duster and everything is OK now. Nothing further needed. Sending as [place] Childes to [name] RPH.

## 2021-01-15 NOTE — Telephone Encounter (Signed)
Agree with plan 

## 2021-01-18 ENCOUNTER — Telehealth: Payer: Self-pay

## 2021-01-18 ENCOUNTER — Other Ambulatory Visit: Payer: Self-pay | Admitting: Family Medicine

## 2021-01-18 NOTE — Telephone Encounter (Signed)
Contacted patient to review blood glucose log. He is very happy with glucose control after dose adjustments last Friday.  Home BG monitoring (uses CGM): His BG was 102 this morning. Usually peaks around 150-160 after meals. Lowest 97. Highest 260 after supper with high carb meal. Denies any BG < 70.  Current treatment:  Tresiba - 55 units in the morning  Novolog - 12 units before breakfast and 8 units before supper  Trulicity 4.5 mg weekly on Sundays  Denies any upset stomach with Trulicity dose increase. Denies cost concerns.  Plan: (PCP review) -Recommend take 1-2 extra units before supper if having a high carb meal.  -Will review BG log again next week and update prescriptions at that time if stable.  [name], PharmD Clinical Pharmacist [place] [facility] at [facility] [phone]

## 2021-01-18 NOTE — Telephone Encounter (Signed)
Do I need to ccmattest these  short notes as well or just sign?

## 2021-01-19 NOTE — Progress Notes (Signed)
CCM care plan from Karry Causer Bolds's appointment on 12/31/20 with [name], Pharm. D, was mailed to him per his request.   [name], CPP notified  [name], [facility] Clinical Pharmacy Assistant [phone]

## 2021-01-26 DIAGNOSIS — H34813 Central retinal vein occlusion, bilateral, with macular edema: Secondary | ICD-10-CM | POA: Diagnosis not present

## 2021-01-26 DIAGNOSIS — H34812 Central retinal vein occlusion, left eye, with macular edema: Secondary | ICD-10-CM | POA: Diagnosis not present

## 2021-01-26 DIAGNOSIS — H35372 Puckering of macula, left eye: Secondary | ICD-10-CM | POA: Diagnosis not present

## 2021-01-26 DIAGNOSIS — E113393 Type 2 diabetes mellitus with moderate nonproliferative diabetic retinopathy without macular edema, bilateral: Secondary | ICD-10-CM | POA: Diagnosis not present

## 2021-01-26 DIAGNOSIS — H3563 Retinal hemorrhage, bilateral: Secondary | ICD-10-CM | POA: Diagnosis not present

## 2021-01-26 LAB — HM DIABETES EYE EXAM

## 2021-01-27 ENCOUNTER — Other Ambulatory Visit: Payer: Self-pay | Admitting: Cardiovascular Disease

## 2021-02-01 ENCOUNTER — Other Ambulatory Visit: Payer: Self-pay | Admitting: Cardiovascular Disease

## 2021-02-03 ENCOUNTER — Other Ambulatory Visit: Payer: Self-pay | Admitting: Family Medicine

## 2021-02-03 ENCOUNTER — Telehealth: Payer: Self-pay

## 2021-02-03 MED ORDER — ROSUVASTATIN CALCIUM 40 MG PO TABS: 40.0000 mg | ORAL_TABLET | ORAL | 0 refills | Status: DC

## 2021-02-03 NOTE — Addendum Note (Signed)
Addended by: Damita Lack on: 02/03/2021 05:07 PM   Modules accepted: Orders

## 2021-02-03 NOTE — Telephone Encounter (Signed)
Updated prescription sent to [facility]. 

## 2021-02-03 NOTE — Telephone Encounter (Signed)
Patient taking rosuvastatin 40 mg -  1 tablet twice weekly. This is his max tolerated dose. Can we send in updated script to ensure patient does not fail adherence measure.  Rosuvastatin 5 mg - take 1 tablet twice weekly to [employer].  Thanks,  [name], PharmD Clinical Pharmacist [place] [facility] at [facility] [phone]

## 2021-02-04 ENCOUNTER — Other Ambulatory Visit: Payer: Self-pay

## 2021-02-04 ENCOUNTER — Ambulatory Visit (INDEPENDENT_AMBULATORY_CARE_PROVIDER_SITE_OTHER): Payer: HMO

## 2021-02-04 ENCOUNTER — Telehealth: Payer: Self-pay

## 2021-02-04 DIAGNOSIS — E1165 Type 2 diabetes mellitus with hyperglycemia: Secondary | ICD-10-CM

## 2021-02-04 DIAGNOSIS — E785 Hyperlipidemia, unspecified: Secondary | ICD-10-CM

## 2021-02-04 DIAGNOSIS — E1139 Type 2 diabetes mellitus with other diabetic ophthalmic complication: Secondary | ICD-10-CM

## 2021-02-04 DIAGNOSIS — E1169 Type 2 diabetes mellitus with other specified complication: Secondary | ICD-10-CM

## 2021-02-04 DIAGNOSIS — IMO0002 Reserved for concepts with insufficient information to code with codable children: Secondary | ICD-10-CM

## 2021-02-04 MED ORDER — NOVOLOG FLEXPEN 100 UNIT/ML ~~LOC~~ SOPN: 14.0000 [IU] | PEN_INJECTOR | Freq: Two times a day (BID) | SUBCUTANEOUS | 6 refills | Status: DC

## 2021-02-04 MED ORDER — INSULIN DEGLUDEC 100 UNIT/ML ~~LOC~~ SOPN: 60.0000 [IU] | PEN_INJECTOR | Freq: Every day | SUBCUTANEOUS | 3 refills | Status: DC

## 2021-02-04 NOTE — Patient Instructions (Signed)
Dear [name],  Below is a summary of the goals we discussed during our follow up appointment on February 04, 2021. Please contact me anytime with questions or concerns.   Visit Information  Patient Care Plan: CCM Pharmacy Care Plan     Problem Identified: CHL AMB "PATIENT-SPECIFIC PROBLEM"      Long-Range Goal: Patient Stated   Start Date: 12/31/2020  Note:    Current Barriers:  Unable to achieve control of diabetes Statin dose incorrect/past due for refill  Pharmacist Clinical Goal(s):  Over the next 30 days, patient will achieve control of diabetes as evidence by A1c and home BG monitoring through collaboration with PharmD and provider.   Interventions: 1:1 collaboration with [name], MD regarding development and update of comprehensive plan of care as evidenced by provider attestation and co-signature Inter-disciplinary care team collaboration (see longitudinal plan of care) Comprehensive medication review performed; medication list updated in electronic medical record  Diabetes (A1c goal <7%) -Not ideally controlled - A1c 7.7% improved from 8.6% -Current medications: Trulicity 4.5 mg - Inject once weekly Tresiba - 55 units in the morning Novolog - 12 units before breakfast and 8 units before supper (taking 12-13 in morning and 10 before supper) -Medications previously tried:  metformin -Current home glucose readings - using libre, scanning 10-15 times daily Fasting glucose: 140-170 After meals: 250-300 after breakfast Hypoglycemia: Reports one low < 70 this month, drank a coke and BG did return to normal. He accidnetally took 50 units of his Novolog instead of [place]. -Current meal patterns: Daily routine - Sleeps from 3-4 AM until 10:30 or 11 AM, Eats BF at 11 AM. Evening meal (sandwich) at 4-6 PM. May have a pack of nabs before bed. -Recommend - Increase Tresiba from 55 units to 60 units daily due to elevated fasting BG. Increase breakfast Novolog from 12 units to  14 units due to elevated post-breakfast BG.  Hyperlipidemia: (LDL goal < 70) -Controlled - LDL 56 -Current treatment: Crestor 40 mg - 1 tablet two days a week  -Medications previously tried: Daily Crestor - unable to tolerate -Educated on Cholesterol goals;  -Patient past due for refill. Per pharmacy records, rx on file is once daily. Asked CMA to update prescription to avoid failed adherence measure. -Recommended to continue current medication  Patient Goals/Self-Care Activities Over the next 30 days, patient will:  - take medications as prescribed   Star Rating Drugs:  Medication:                Last Fill:         Day Supply Trulicity 4.5mg               Patient assistance Losartan 50 mg           01/15/21            90 Rosuvastatin 40 mg    07/28/20          90  Follow Up Plan: The care management team will reach out to the patient again over the next 30 days. PCP visit in 2 weeks.      The patient verbalized understanding of instructions, educational materials, and care plan provided today and agreed to receive a mailed copy of patient instructions, educational materials, and care plan.  The pharmacy team will reach out to the patient again over the next 30 days.   [name], PharmD Clinical Pharmacist [facility] at [facility] [phone]

## 2021-02-04 NOTE — Addendum Note (Signed)
Addended by: Damita Lack on: 02/04/2021 05:04 PM   Modules accepted: Orders

## 2021-02-04 NOTE — Telephone Encounter (Signed)
Changes approved. Please send in update prescriptions as requested.

## 2021-02-04 NOTE — Telephone Encounter (Signed)
New prescriptions sent to [facility] as instructed by Dr. Ermalene Searing.

## 2021-02-04 NOTE — Telephone Encounter (Signed)
PCP Consult  Summary: Home glucose readings - using libre, scanning 10-15 times daily Fasting glucose: 140-170 After meals: peaks at 250-300 after breakfast, 200 after supper   Recommendations: - Increase Tresiba from 55 units to 60 units daily due to elevated fasting BG. - Increase Novolog before breakfast from 12 units to 14 units due to elevated post-breakfast BG.   Plan: -PCP send updated prescriptions to [facility] if approved -Patient has PCP follow up in 1 week    [name], PharmD Clinical Pharmacist [facility] at [facility] [phone]

## 2021-02-04 NOTE — Progress Notes (Addendum)
Encounter details: CCM Time Spent       Value Time User   Time spent with patient (minutes)  30 02/04/2021  2:45 PM Debbora Dus, [facility]   Time spent performing Chart review  30 02/04/2021  2:45 PM Debbora Dus, [facility]   Total time (minutes)  60 02/04/2021  2:45 PM Debbora Dus, RPH      Moderate to High Complex Decision Making       Value Time User   Moderate to High complex decision making  Yes 02/04/2021  2:45 PM Debbora Dus, [facility]      CCM Services: This encounter meets complex CCM services and moderate to high decision making.  Prior to outreach and patient consent for Chronic Care Management, I referred this patient for services after reviewing the nominated patient list or from a personal encounter with the patient.  I have personally reviewed this encounter including the documentation in this note and have collaborated with the care management provider regarding care management and care coordination activities to include development and update of the comprehensive care plan. I am certifying that I agree with the content of this note and encounter as supervising physician.   Chronic Care Management Pharmacy Note  02/04/2021 Name:  [name]          MRN:  [id number] DOB:  Jun 20, 1951  Summary: Home glucose readings - using libre, scanning 10-15 times daily Fasting glucose: 140-170 After meals: peaks at 250-300 after breakfast, 200 after supper   Recommendations: - Increase Tresiba from 55 units to 60 units daily due to elevated fasting BG. - Increase Novolog before breakfast from 12 units to 14 units due to elevated post-breakfast BG.  Plan: -Send telephone note to PCP for approval of insulin dose adjustments  -Follow up with PCP in 1 week -CCM team call in 2-3 weeks for BG log    Subjective: [name] is an 70 y.o. year old male who is a primary patient of Bedsole, Amy E, MD.  The CCM team was consulted for assistance with disease management and care  coordination needs.    Engaged with patient by telephone for follow up visit in response to provider referral for pharmacy case management and/or care coordination services. Contacted to review diabetes medications.  Consent to Services:  The patient was given information about Chronic Care Management services, agreed to services, and gave verbal consent prior to initiation of services.  Please see initial visit note for detailed documentation.   Patient Care Team: Jinny Sanders, MD as PCP - General Rockey Situ Kathlene November, MD as Consulting Physician (Cardiology) Debbora Dus, [facility] as Pharmacist (Pharmacist)  Recent office visits:  12/31/20 - CCM - Increase Trulicity to 4.5 mg weekly; Change insulin from Novolin 70/30 to [place] and Novolog. 11/12/20- Dr. [name]- PCP- Started tramadol BID for lower back pain. Start B12 1000 mcg daily.   Recent consult visits:  None since last CCM contact.   Hospital visits:  None in previous 6 months  Objective:  Lab Results  Component Value Date   CREATININE 1.05 11/02/2020   BUN 14 11/02/2020   GFR 71.68 11/02/2020   GFRNONAA >60 04/02/2020   GFRAA >60 04/02/2020   NA 143 11/02/2020   K 4.5 11/02/2020   CALCIUM 9.1 11/02/2020   CO2 30 11/02/2020    Lab Results  Component Value Date/Time   HGBA1C 7.7 (H) 11/02/2020 08:42 AM   HGBA1C 8.6 (H) 07/31/2020 08:41 AM   HGBA1C 9.6  10/02/2018 12:00 AM   GFR 71.68 11/02/2020 08:42 AM   GFR 76.14 07/31/2020 08:41 AM   MICROALBUR <0.7 02/21/2017 01:57 PM   MICROALBUR 0.8 02/16/2016 08:24 AM    Last diabetic Eye exam:  Lab Results  Component Value Date/Time   HMDIABEYEEXA Retinopathy (A) 12/11/2019 12:00 AM    Last diabetic Foot exam:  Lab Results  Component Value Date/Time   HMDIABFOOTEX done 11/12/2020 12:00 AM     Lab Results  Component Value Date   CHOL 107 11/02/2020   HDL 34.30 (L) 11/02/2020   LDLCALC 56 11/02/2020   LDLDIRECT 174.0 10/19/2017   TRIG 82.0 11/02/2020    CHOLHDL 3 11/02/2020    Hepatic Function Latest Ref Rng & Units 11/02/2020 07/31/2020 04/14/2020  Total Protein 6.0 - 8.3 g/dL 7.4 6.7 7.2  Albumin 3.5 - 5.2 g/dL 3.9 3.9 4.0  AST 0 - 37 U/L 18 12 16   ALT 0 - 53 U/L 14 12 19   Alk Phosphatase 39 - 117 U/L 77 85 85  Total Bilirubin 0.2 - 1.2 mg/dL 0.4 0.4 0.3  Bilirubin, Direct 0.0 - 0.3 mg/dL - - -    Lab Results  Component Value Date/Time   TSH 2.64 10/19/2017 04:00 PM   TSH 1.14 03/11/2014 10:26 AM   FREET4 0.79 10/19/2017 04:00 PM    CBC Latest Ref Rng & Units 07/31/2020 03/31/2020 10/18/2018  WBC 4.0 - 10.5 K/uL 6.6 7.0 7.3  Hemoglobin 13.0 - 17.0 g/dL 13.2 13.4 13.5  Hematocrit 39.0 - 52.0 % 40.2 41.0 39.8  Platelets 150.0 - 400.0 K/uL 198.0 224 218.0    Lab Results  Component Value Date/Time   VD25OH 37.72 07/31/2020 08:41 AM   VD25OH 47.26 02/27/2018 09:26 AM    Clinical ASCVD: Yes  The ASCVD Risk score Mikey Bussing DC Jr., et al., 2013) failed to calculate for the following reasons:   The patient has a prior MI or stroke diagnosis    Depression screen [facility] 2/9 11/10/2020 06/12/2020 01/30/2020  Decreased Interest 1 3 3   Down, Depressed, Hopeless 1 3 3   PHQ - 2 Score 2 6 6   Altered sleeping 0 3 3  Tired, decreased energy 0 3 3  Change in appetite 0 2 0  Feeling bad or failure about yourself  0 0 0  Trouble concentrating 0 2 0  Moving slowly or fidgety/restless 0 2 0  Suicidal thoughts 0 0 0  PHQ-9 Score 2 18 12   Difficult doing work/chores Not difficult at all Extremely dIfficult Extremely dIfficult  Some recent data might be hidden    Social History   Tobacco Use  Smoking Status Never  Smokeless Tobacco Never   BP Readings from Last 3 Encounters:  11/12/20 140/62  04/02/20 (!) 137/50  01/21/20 138/60   Pulse Readings from Last 3 Encounters:  11/12/20 76  04/02/20 79  01/21/20 79   Wt Readings from Last 3 Encounters:  11/12/20 288 lb 12 oz (131 kg)  08/07/20 294 lb (133.4 kg)  04/21/20 291 lb (132 kg)     Assessment/Interventions: Review of patient past medical history, allergies, medications, health status, including review of consultants reports, laboratory and other test data, was performed as part of comprehensive evaluation and provision of chronic care management services.   SDOH:  (Social Determinants of Health) assessments and interventions performed: Yes - Trulicity PAP active    CCM Care Plan  Allergies  Allergen Reactions   Atorvastatin Other (See Comments)    Body aches  Medications Reviewed Today     Reviewed by Debbora Dus, [facility] (Pharmacist) on 02/04/21 at 1437  Med List Status: <None>   Medication Order Taking? Sig Documenting Provider Last Dose Status Informant  aspirin 81 MG EC tablet [id number]  Take 1 tablet (81 mg total) by mouth daily. Theora Gianotti, NP  Active Self           Med Note Pilar Plate May 01, 2016  3:28 PM)    carvedilol (COREG) 6.25 MG tablet [id number]  TAKE 1 TABLET BY MOUTH ONCE DAILY Gollan, Kathlene November, MD  Active   cholecalciferol (VITAMIN D3) 25 MCG (1000 UNIT) tablet [id number]  Take 2,000 Units by mouth daily. [provider]  Active   clopidogrel (PLAVIX) 75 MG tablet [id number]  TAKE 1 TABLET BY MOUTH ONCE DAILY WITH BREAKFAST Bedsole, Amy E, MD  Active   Continuous Blood Gluc Receiver (FREESTYLE LIBRE 14 DAY READER) [place] [id number]  1 Device by Does not apply route every 14 (fourteen) days. Jinny Sanders, MD  Active Self  Continuous Blood Gluc Sensor (FREESTYLE LIBRE 14 DAY SENSOR) [place] [id number]  USE TO CHECK BLOOD SUGAR AS DIRECTED. CHANGE EVERY 14 DAYS. Jinny Sanders, MD  Active   Dulaglutide 4.5 MG/0.5ML SOPN [id number] Yes Inject 4.5 mg into the skin. Inject 4.5 mg once weekly (patient receives from [employer]) [provider] Taking Active Self  ezetimibe (ZETIA) 10 MG tablet [id number]  TAKE 1 TABLET BY MOUTH ONCE DAILY Gollan, Kathlene November, MD  Active   furosemide (LASIX) 40 MG tablet [id number]   TAKE 1 TABLET BY MOUTH DAILY CAN TAKE A SECOND DAILY DOSE AS NEEDED Bedsole, Amy E, MD  Active   insulin aspart (NOVOLOG FLEXPEN) 100 UNIT/ML FlexPen [id number]  10 units before breakfast and 5 units before evening meal  Patient taking differently: 12-14 units before breakfast and 10 units before evening meal   Bedsole, Amy E, MD  Active Self  insulin degludec (TRESIBA) 100 UNIT/ML FlexTouch Pen [id number] Yes Inject 50 Units into the skin daily.  Patient taking differently: Inject 60 Units into the skin daily.   Jinny Sanders, MD Taking Active   Insulin Pen Needle (PEN NEEDLES) 31G X 6 MM MISC [id number]  1 each by Does not apply route as directed. To Use With Novolog FlexPen Bedsole, Amy E, MD  Active   isosorbide mononitrate (IMDUR) 30 MG 24 hr tablet [id number]  TAKE 1 TABLET BY MOUTH TWICE DAILY Gollan, Kathlene November, MD  Active   losartan (COZAAR) 50 MG tablet [id number]  TAKE 1 TABLET BY MOUTH ONCE DAILY Bedsole, Amy E, MD  Active   nitroGLYCERIN (NITROSTAT) 0.4 MG SL tablet [id number]  DISSOLVE 1 TABLET UNDER TONGUE AS NEEDEDFOR CHEST PAIN. MAY REPEAT 5 MINUTES APART 3 TIMES IF NEEDED Gollan, Kathlene November, MD  Active Self  potassium chloride SA (K-DUR) 20 MEQ tablet [id number]  Take 1 tablet (20 mEq total) by mouth daily. Jinny Sanders, MD  Active   rosuvastatin (CRESTOR) 40 MG tablet [id number]  Take 1 tablet (40 mg total) by mouth 2 (two) times a week. Jinny Sanders, MD  Active   traMADol (ULTRAM) 50 MG tablet [id number]  TAKE 1 TABLET BY MOUTH EVERY 12 HOURS ASNEEDED Bedsole, Amy E, MD  Active   venlafaxine XR (EFFEXOR-XR) 150 MG 24 hr capsule [id number]  TAKE 1 CAPSULE BY MOUTH DAILY WITH BREAKFAST. TAKE WITH EFFEXOR XR 75MG FORA TOTAL OF 225MG Jinny Sanders, MD  Active   venlafaxine XR (EFFEXOR-XR) 75 MG 24 hr capsule [id number]  TAKE 1 CAPSULE BY MOUTH ONCE DAILY. TAKEIN ADDITION TO THE 150 MG CAPSULE FOR A TOTAL DOSE OF 225MG DAILY Bedsole, Amy E, MD  Active   vitamin B-12 (CYANOCOBALAMIN) 1000 MCG  tablet [id number]  Take 1,000 mcg by mouth daily. [provider]  Active Self            Patient Active Problem List   Diagnosis Date Noted   Chronic low back pain 11/12/2020   Hyperglycemia due to type 2 diabetes mellitus ([place]) 03/31/2020   ED (erectile dysfunction) 03/06/2018   Acute on chronic diastolic CHF (congestive heart failure) ([place]) 09/20/2016   Adjustment disorder with mixed anxiety and depressed mood 09/09/2016   Gastroesophageal reflux disease 09/09/2016   Coronary artery disease, non-occlusive    Elevated left ventricular end-diastolic pressure (LVEDP) 03/11/2016   Anemia 03/11/2016   Hypertensive heart disease with heart failure ([place])    Uncontrolled type 2 diabetes with eye complications ([place])    Coronary artery disease of native artery of native heart with stable angina pectoris ([place])    Morbid obesity ([place])    Chronic chest pain    NSTEMI (non-ST elevated myocardial infarction) ([place])    Advanced care planning/counseling discussion 11/20/2015   Noncompliance with diabetes treatment 11/05/2015   Noncompliance with diet and medication regimen 11/05/2015   Diabetic retinopathy ([place]) 09/23/2014   Snoring 12/21/2012   HYPOGONADISM 09/28/2010   B12 deficiency 09/28/2010   Vitamin D deficiency 09/28/2010   OTHER MALAISE AND FATIGUE 09/17/2010   TRIGGER FINGER, RIGHT MIDDLE 09/14/2010   BRANCH RETINAL VEIN OCCLUSION 11/26/2009   CONSTIPATION 08/10/2009   GASTRITIS 07/29/2009   Major depressive disorder, recurrent episode, moderate ([place]) 07/06/2007   ERECTILE DYSFUNCTION 04/10/2007   RENAL CALCULUS, HX OF 03/26/2007   Hyperlipidemia associated with type 2 diabetes mellitus ([place]) 12/05/2006   Hypertension associated with diabetes ([place]) 12/05/2006   Osteoarthritis 12/05/2006    Immunization History  Administered Date(s) Administered   Fluad Quad(high Dose 65+) 05/23/2020   Influenza Split 05/12/2011, 07/09/2012   Influenza Whole 06/30/2007, 05/29/2008,  07/03/2009   Influenza, High Dose Seasonal PF 05/14/2019   Influenza,inj,Quad PF,6+ Mos 05/31/2013, 07/09/2014, 06/25/2015, 07/05/2016, 06/22/2017, 06/11/2018   PFIZER(Purple Top)SARS-COV-2 Vaccination 10/28/2019, 11/18/2019, 05/29/2020   Pneumococcal Conjugate-13 02/14/2015   Pneumococcal Polysaccharide-23 06/30/2007, 12/21/2012, 02/27/2018   Td 06/30/2007    Conditions to be addressed/monitored:  Hyperlipidemia and Diabetes  Patient Care Plan: CCM Pharmacy Care Plan     Problem Identified: CHL AMB "PATIENT-SPECIFIC PROBLEM"      Long-Range Goal: Patient Stated   Start Date: 12/31/2020  Note:    Current Barriers:  Unable to achieve control of diabetes Statin dose incorrect/past due for refill  Pharmacist Clinical Goal(s):  Over the next 30 days, patient will achieve control of diabetes as evidence by A1c and home BG monitoring through collaboration with PharmD and provider.   Interventions: 1:1 collaboration with Jinny Sanders, MD regarding development and update of comprehensive plan of care as evidenced by provider attestation and co-signature Inter-disciplinary care team collaboration (see longitudinal plan of care) Comprehensive medication review performed; medication list updated in electronic medical record  Diabetes (A1c goal <7%) -Not ideally controlled - A1c 7.7% improved from 8.6% -Current medications: Trulicity 4.5 mg - Inject once weekly Tresiba - 55 units in the morning Novolog - 12 units before breakfast and 8 units before supper (taking 12-13 in morning and 10 before supper) -Medications previously  tried:  metformin -Current home glucose readings - using libre, scanning 10-15 times daily Fasting glucose: 140-170 After meals: 250-300 after breakfast Hypoglycemia: Reports one low < 70 this month, drank a coke and BG did return to normal. He accidnetally took 50 units of his Novolog instead of [place]. -Current meal patterns: Daily routine - Sleeps from 3-4 AM  until 10:30 or 11 AM, Eats BF at 11 AM. Evening meal (sandwich) at 4-6 PM. May have a pack of nabs before bed. -Recommend - Increase Tresiba from 55 units to 60 units daily due to elevated fasting BG. Increase breakfast Novolog from 12 units to 14 units due to elevated post-breakfast BG.  Hyperlipidemia: (LDL goal < 70) -Controlled - LDL 56 -Current treatment: Crestor 40 mg - 1 tablet two days a week  -Medications previously tried: Daily Crestor - unable to tolerate -Educated on Cholesterol goals;  -Patient past due for refill. Per pharmacy records, rx on file is once daily. Asked CMA to update prescription to avoid failed adherence measure. -Recommended to continue current medication  Patient Goals/Self-Care Activities Over the next 30 days, patient will:  - take medications as prescribed   Star Rating Drugs:  Medication:                Last Fill:         Day Supply Trulicity 9.1RW              Patient assistance Losartan 50 mg           01/15/21            90 Rosuvastatin 40 mg    07/28/20          90  Follow Up Plan: The care management team will reach out to the patient again over the next 30 days. PCP visit in 2 weeks.     Medication Assistance:  Trulicity obtained through [facility] medication assistance program.  Enrollment ends 08/28/2021  Patient's preferred pharmacy is:  [place], [place] [address] Phone: [phone] Fax: [phone]  Uses pill box? Yes  Care Plan and Follow Up Patient Decision:  Patient agrees to Care Plan and Follow-up.  Debbora Dus, PharmD Clinical Pharmacist [facility] at [facility] [phone]

## 2021-02-10 ENCOUNTER — Other Ambulatory Visit: Payer: Self-pay | Admitting: Family Medicine

## 2021-02-10 NOTE — Telephone Encounter (Signed)
Last office visit 11/12/2020 for CPE.  Last refilled 12/29/2020 for #60 with no refills.  Next Appt: 02/12/2021 for 3 month follow up.

## 2021-02-12 ENCOUNTER — Ambulatory Visit: Payer: HMO | Admitting: Family Medicine

## 2021-02-12 ENCOUNTER — Telehealth: Payer: Self-pay | Admitting: *Deleted

## 2021-02-12 DIAGNOSIS — IMO0002 Reserved for concepts with insufficient information to code with codable children: Secondary | ICD-10-CM

## 2021-02-12 MED ORDER — PEN NEEDLES 31G X 6 MM MISC: 3 refills | Status: DC

## 2021-02-12 NOTE — Telephone Encounter (Signed)
Updated prescription sent to [facility].

## 2021-02-12 NOTE — Telephone Encounter (Signed)
Marshaun at [facility] left a voicemail stating that they are trying to get an updated script on patient's pen needles. Powell stated that the patient's script shows he is to use them twice a day. Raford stated that patient stated that he is using them three times a day now and that is why he is out. [facility] is requesting a new script with the new directions be sent to them.

## 2021-02-17 ENCOUNTER — Encounter: Payer: Self-pay | Admitting: Cardiovascular Disease

## 2021-02-17 ENCOUNTER — Other Ambulatory Visit: Payer: Self-pay

## 2021-02-17 ENCOUNTER — Ambulatory Visit (INDEPENDENT_AMBULATORY_CARE_PROVIDER_SITE_OTHER): Payer: HMO | Admitting: Cardiovascular Disease

## 2021-02-17 VITALS — BP 134/80 | HR 77 | Ht 67.0 in | Wt 279.2 lb

## 2021-02-17 DIAGNOSIS — E1159 Type 2 diabetes mellitus with other circulatory complications: Secondary | ICD-10-CM

## 2021-02-17 DIAGNOSIS — I25118 Atherosclerotic heart disease of native coronary artery with other forms of angina pectoris: Secondary | ICD-10-CM | POA: Diagnosis not present

## 2021-02-17 DIAGNOSIS — I110 Hypertensive heart disease with heart failure: Secondary | ICD-10-CM

## 2021-02-17 DIAGNOSIS — I152 Hypertension secondary to endocrine disorders: Secondary | ICD-10-CM | POA: Diagnosis not present

## 2021-02-17 DIAGNOSIS — I5033 Acute on chronic diastolic (congestive) heart failure: Secondary | ICD-10-CM | POA: Diagnosis not present

## 2021-02-17 NOTE — Progress Notes (Signed)
Date:  02/17/2021   ID:  [name], DOB 1951-03-01, MRN [id number]  Patient Location:  [address] [place] [address]   Provider location:   [facility], [place] office  PCP:  [name], MD  Cardiologist:  Fonnie Mu  Chief Complaint  Patient presents with   12 month follow up     "Doing well." Medications reviewed by the patient verbally.      History of Present Illness:    [name] is a 70 y.o. male past medical history of morbid obesity,  HTN,  DM,  CAD  NSTEMI in 01-07-2016 (PCI to LAD; was discharged on Plavix) and again 02/2016 (no PCI; was discharged on Brilinta), Again presented to [facility] ER on 04/05/16 for NSTEMI (no PCI),  catheterization 04/06/2016, stable LAD stent, occluded RCA with collaterals Chronic total occlusion of RCA EF 55 to 60% Chronic chest pain syndrome GERD Who presents for follow-up of his coronary artery disease  Last office visit with myself 2020/01/07 Wife died >3 years ago Lives on his own Does his own chores  Lab work reviewed A1c much improved 11 down to 7.7 Total cholesterol much improved 200 down to 107 Normal BMP  Lost 20 pounds, diet changes Mowes several yards early in the morning No regular exercise program  Sleeps day, goes to sleep 3-4 Am Sleeps till 12 noon Watches TV a lot Chronic fatigue  Leg swelling stable, Continues on his Lasix EKG personally reviewed by myself on todays visit Shows normal sinus rhythm rate 77 bpm consider old inferior MI  Long history of atypical chest pain, Numerous cardiac catheterizations including July 2017, August 2017, treated medically Not much chest pain     Prior CV studies:   The following studies were reviewed today:  Chronic total occlusion of the right coronary in the proximal segment well collateralized from the left circumflex and LAD. Widely patent proximal to mid LAD stent previously placed in July. There is first diagonal  diagonal and LAD 30 and 50% narrowing respectively. Widely patent circumflex unchanged from previous with 70% narrowing in a small branch of the first marginal. Circumflex collateralizes the distal right coronary left ventricular branch. Inferobasal hypokinesis. EF 50%. EDP is normal. Unable to identify a significant change in the angiographic appearance since prior study in July.   Past Medical History:  Diagnosis Date   Back injury 02/2002   worker's comp   CHF (congestive heart failure) (HCC)    Coronary artery disease, non-occlusive    a. cath 06-Jan-2001 with no sig CAD;  b. cath 2007/01/07 normal LM, LAD, LCx, p&dRCA 20-30%, PDA 30%; c.11/2015 NSTEMI/PCI: LM nl, LAD 95p (2.5x15 Xience DES), LCX nl, RCA 100p/m w/ L->R collats, EF 55-65% c. NSTEMI (02/2016) with no culprit leision, switched to Brilinta.  d. NSTEMI 03/2016: again, no culprit lesion and switched back to plavix 2/2 SOB with Brilnta.     Depression    Diabetes mellitus type 2, insulin dependent (HCC)    Hyperlipemia    Hypertensive heart disease    Kidney stones    Morbid obesity (HCC)    Osteoarthritis    Snoring    Past Surgical History:  Procedure Laterality Date   CARDIAC CATHETERIZATION  09/2000   diffuse LAD 30% LCA  EF 50-60%   CARDIAC CATHETERIZATION  06/2007   no significant CAD   CARDIAC CATHETERIZATION N/A 12/25/2015   Procedure: Left Heart Cath and Coronary Angiography;  Surgeon: Antonieta Iba, MD;  Location: [facility] INVASIVE CV LAB;  Service: Cardiovascular;  Laterality: N/A;   CARDIAC CATHETERIZATION N/A 12/25/2015   Procedure: Coronary Stent Intervention;  Surgeon: Alwyn Pea, MD;  Location: [facility] INVASIVE CV LAB;  Service: Cardiovascular;  Laterality: N/A;   CARDIAC CATHETERIZATION N/A 03/10/2016   Procedure: Left Heart Cath and Coronary Angiography;  Surgeon: Iran Ouch, MD;  Location: [facility] INVASIVE CV LAB;  Service: Cardiovascular;  Laterality: N/A;   CARDIAC CATHETERIZATION N/A 04/06/2016   Procedure: Left  Heart Cath and Coronary Angiography;  Surgeon: Lyn Records, MD;  Location: [facility] INVASIVE CV LAB;  Service: Cardiovascular;  Laterality: N/A;   CIRCUMCISION       Current Meds  Medication Sig   aspirin 81 MG EC tablet Take 1 tablet (81 mg total) by mouth daily.   carvedilol (COREG) 6.25 MG tablet TAKE 1 TABLET BY MOUTH ONCE DAILY   cholecalciferol (VITAMIN D3) 25 MCG (1000 UNIT) tablet Take 2,000 Units by mouth daily.   clopidogrel (PLAVIX) 75 MG tablet TAKE 1 TABLET BY MOUTH ONCE DAILY WITH BREAKFAST   Continuous Blood Gluc Receiver (FREESTYLE LIBRE 14 DAY READER) DEVI 1 Device by Does not apply route every 14 (fourteen) days.   Continuous Blood Gluc Sensor (FREESTYLE LIBRE 14 DAY SENSOR) MISC USE TO CHECK BLOOD SUGAR AS DIRECTED. CHANGE EVERY 14 DAYS.   Dulaglutide 4.5 MG/0.5ML SOPN Inject 4.5 mg into the skin. Inject 4.5 mg once weekly (patient receives from [employer])   ezetimibe (ZETIA) 10 MG tablet TAKE 1 TABLET BY MOUTH ONCE DAILY   furosemide (LASIX) 40 MG tablet TAKE 1 TABLET BY MOUTH DAILY CAN TAKE A SECOND DAILY DOSE AS NEEDED   insulin aspart (NOVOLOG FLEXPEN) 100 UNIT/ML FlexPen Inject 14 Units into the skin 2 (two) times daily with a meal.   insulin degludec (TRESIBA) 100 UNIT/ML FlexTouch Pen Inject 60 Units into the skin daily.   Insulin Pen Needle (PEN NEEDLES) 31G X 6 MM MISC Use to inject insulin 3 times a day   isosorbide mononitrate (IMDUR) 30 MG 24 hr tablet TAKE 1 TABLET BY MOUTH TWICE DAILY   losartan (COZAAR) 50 MG tablet TAKE 1 TABLET BY MOUTH ONCE DAILY   nitroGLYCERIN (NITROSTAT) 0.4 MG SL tablet DISSOLVE 1 TABLET UNDER TONGUE AS NEEDEDFOR CHEST PAIN. MAY REPEAT 5 MINUTES APART 3 TIMES IF NEEDED   potassium chloride SA (K-DUR) 20 MEQ tablet Take 1 tablet (20 mEq total) by mouth daily.   rosuvastatin (CRESTOR) 40 MG tablet Take 1 tablet (40 mg total) by mouth 2 (two) times a week.   traMADol (ULTRAM) 50 MG tablet TAKE 1 TABLET BY MOUTH EVERY 12 HOURS ASNEEDED    venlafaxine XR (EFFEXOR-XR) 150 MG 24 hr capsule TAKE 1 CAPSULE BY MOUTH DAILY WITH BREAKFAST. TAKE WITH EFFEXOR XR 75MG  FORA TOTAL OF 225MG    venlafaxine XR (EFFEXOR-XR) 75 MG 24 hr capsule TAKE 1 CAPSULE BY MOUTH ONCE DAILY. TAKEIN ADDITION TO THE 150 MG CAPSULE FOR A TOTAL DOSE OF 225MG  DAILY   vitamin B-12 (CYANOCOBALAMIN) 1000 MCG tablet Take 1,000 mcg by mouth daily.     Allergies:   Atorvastatin   Social History   Tobacco Use   Smoking status: Never   Smokeless tobacco: Never  Vaping Use   Vaping Use: Never used  Substance Use Topics   Alcohol use: No    Alcohol/week: 0.0 standard drinks   Drug use: No     Family Hx: [name] family history  includes Alzheimer's disease in his mother; Cancer in his brother; Diabetes in his father; Emphysema in his mother; Heart disease in his father.  ROS:   Please see the history of present illness.    Review of Systems  Constitutional: Negative.   Respiratory: Negative.    Cardiovascular: Negative.   Gastrointestinal: Negative.   Musculoskeletal: Negative.   Neurological: Negative.   Psychiatric/Behavioral: Negative.    All other systems reviewed and are negative.    Labs/Other Tests and Data Reviewed:    Recent Labs: 03/31/2020: B Natriuretic Peptide 48.4 07/31/2020: Hemoglobin 13.2; Platelets 198.0 11/02/2020: ALT 14; BUN 14; Creatinine, Ser 1.05; Potassium 4.5; Sodium 143   Recent Lipid Panel Lab Results  Component Value Date/Time   CHOL 107 11/02/2020 08:42 AM   TRIG 82.0 11/02/2020 08:42 AM   HDL 34.30 (L) 11/02/2020 08:42 AM   CHOLHDL 3 11/02/2020 08:42 AM   LDLCALC 56 11/02/2020 08:42 AM   LDLDIRECT 174.0 10/19/2017 04:00 PM    Wt Readings from Last 3 Encounters:  02/17/21 279 lb 4 oz (126.7 kg)  11/12/20 288 lb 12 oz (131 kg)  08/07/20 294 lb (133.4 kg)     Exam:   BP 134/80 (BP Location: Left Arm, Patient Position: Sitting, Cuff Size: Large)   Pulse 77   Ht 5\' 7"  (1.702 m)   Wt 279 lb 4 oz (126.7 kg)    SpO2 97%   BMI 43.74 kg/m  Constitutional: Obese oriented to person, place, and time. No distress.  HENT:  Head: Grossly normal Eyes:  no discharge. No scleral icterus.  Neck: No JVD, no carotid bruits  Cardiovascular: Regular rate and rhythm, no murmurs appreciated Pulmonary/Chest: Clear to auscultation bilaterally, no wheezes or rails Abdominal: Soft.  no distension.  no tenderness.  Musculoskeletal: Normal range of motion Neurological:  normal muscle tone. Coordination normal. No atrophy Skin: Skin warm and dry Psychiatric: normal affect, pleasant   ASSESSMENT & PLAN:    Coronary artery disease of native artery of native heart with stable angina pectoris (HCC) Currently with no symptoms of angina. No further workup at this time. Continue current medication regimen.  Morbid obesity (HCC) Weight coming down through dietary changes Recommend walking program Active  Hyperlipidemia LDL goal <70 Cholesterol is at goal on the current lipid regimen. No changes to the medications were made.  Essential hypertension, benign Blood pressure is well controlled on today's visit. No changes made to the medications.  DM (diabetes mellitus), secondary, uncontrolled, w/eye complications (HCC) Much better sugars, discussed  GERD: Stable, takes PPI   Total encounter time more than 25 minutes  Greater than 50% was spent in counseling and coordination of care with the patient    Signed, , MD  02/17/2021 2:40 PM    [facility] [place] [address], [place], [place]

## 2021-02-17 NOTE — Patient Instructions (Signed)
Medication Instructions:  No changes  If you need a refill on your cardiac medications before your next appointment, please call your pharmacy.    Lab work: No new labs needed   If you have labs (blood work) drawn today and your tests are completely normal, you will receive your results only by: . MyChart Message (if you have MyChart) OR . A paper copy in the mail If you have any lab test that is abnormal or we need to change your treatment, we will call you to review the results.   Testing/Procedures: No new testing needed   Follow-Up: At [facility], you and your health needs are our priority.  As part of our continuing mission to provide you with exceptional heart care, we have created designated Provider Care Teams.  These Care Teams include your primary Cardiologist (physician) and Advanced Practice Providers (APPs -  Physician Assistants and Nurse Practitioners) who all work together to provide you with the care you need, when you need it.  . You will need a follow up appointment in 12 months  . Providers on your designated Care Team:   . Christopher [name], NP . Ryan Dunn, PA-C . Jacquelyn Visser, PA-C  Any Other Special Instructions Will Be Listed Below (If Applicable).  COVID-19 Vaccine Information can be found at: [email or web address] For questions related to vaccine distribution or appointments, please email [email or web address] or call [phone].     

## 2021-02-19 ENCOUNTER — Other Ambulatory Visit: Payer: Self-pay | Admitting: Family Medicine

## 2021-02-25 ENCOUNTER — Telehealth: Payer: Self-pay | Admitting: Cardiovascular Disease

## 2021-02-25 ENCOUNTER — Other Ambulatory Visit: Payer: Self-pay | Admitting: Family Medicine

## 2021-02-25 MED ORDER — NITROGLYCERIN 0.4 MG SL SUBL: SUBLINGUAL_TABLET | SUBLINGUAL | 3 refills | Status: DC

## 2021-02-25 NOTE — Telephone Encounter (Signed)
*  STAT* If patient is at the pharmacy, call can be transferred to refill team.   1. Which medications need to be refilled? (please list name of each medication and dose if known) nitroglycerin  2. Which pharmacy/location (including street and city if local pharmacy) is medication to be sent to? [facility]  3. Do they need a 30 day or 90 day supply? 1 bottle

## 2021-03-08 ENCOUNTER — Other Ambulatory Visit: Payer: Self-pay | Admitting: Cardiovascular Disease

## 2021-03-12 NOTE — Telephone Encounter (Signed)
Noted Dr. Mariah Milling routed this 2 years ago Disregarding Pt recently seen by Dr. Mariah Milling 02/17/2021 Meds reviewed at that time with no changes.

## 2021-03-18 ENCOUNTER — Telehealth: Payer: Self-pay

## 2021-03-18 NOTE — Chronic Care Management (AMB) (Addendum)
Chronic Care Management Pharmacy Assistant   Name: [name]  MRN: [id number] DOB: 02-06-1951  Reason for Encounter: Diabetes Review   Recent office visits:  None since last CCM contact  Recent consult visits:  02/17/21 - Cardiology no medication changes recommend walking program  Hospital visits:  None in previous 6 months  Medications: Outpatient Encounter Medications as of 03/18/2021  Medication Sig   aspirin 81 MG EC tablet Take 1 tablet (81 mg total) by mouth daily.   carvedilol (COREG) 6.25 MG tablet TAKE 1 TABLET BY MOUTH ONCE DAILY   cholecalciferol (VITAMIN D3) 25 MCG (1000 UNIT) tablet Take 2,000 Units by mouth daily.   clopidogrel (PLAVIX) 75 MG tablet TAKE 1 TABLET BY MOUTH ONCE DAILY WITH BREAKFAST   Continuous Blood Gluc Receiver (FREESTYLE LIBRE 14 DAY READER) DEVI 1 Device by Does not apply route every 14 (fourteen) days.   Continuous Blood Gluc Sensor (FREESTYLE LIBRE 14 DAY SENSOR) MISC USE TO CHECK BLOOD SUGAR AS DIRECTED. CHANGE EVERY 14 DAYS.   Dulaglutide 4.5 MG/0.5ML SOPN Inject 4.5 mg into the skin. Inject 4.5 mg once weekly (patient receives from [employer])   ezetimibe (ZETIA) 10 MG tablet TAKE 1 TABLET BY MOUTH ONCE DAILY   furosemide (LASIX) 40 MG tablet TAKE 1 TABLET BY MOUTH DAILY CAN TAKE A SECOND DAILY DOSE AS NEEDED   Insulin Aspart FlexPen 100 UNIT/ML SOPN Inject 13 units in the morning and 8 units in the evening   insulin degludec (TRESIBA) 100 UNIT/ML FlexTouch Pen Inject 60 Units into the skin daily.   Insulin Pen Needle (PEN NEEDLES) 31G X 6 MM MISC Use to inject insulin 3 times a day   isosorbide mononitrate (IMDUR) 30 MG 24 hr tablet TAKE 1 TABLET BY MOUTH TWICE DAILY   losartan (COZAAR) 50 MG tablet TAKE 1 TABLET BY MOUTH ONCE DAILY   nitroGLYCERIN (NITROSTAT) 0.4 MG SL tablet DISSOLVE 1 TABLET UNDER TONGUE AS NEEDEDFOR CHEST PAIN. MAY REPEAT 5 MINUTES APART 3 TIMES IF NEEDED   potassium chloride SA (K-DUR) 20 MEQ tablet Take 1 tablet  (20 mEq total) by mouth daily.   rosuvastatin (CRESTOR) 40 MG tablet Take 1 tablet (40 mg total) by mouth 2 (two) times a week.   traMADol (ULTRAM) 50 MG tablet TAKE 1 TABLET BY MOUTH EVERY 12 HOURS ASNEEDED   venlafaxine XR (EFFEXOR-XR) 150 MG 24 hr capsule TAKE 1 CAPSULE BY MOUTH DAILY WITH BREAKFAST. TAKE WITH EFFEXOR XR 75MG  FORA TOTAL OF 225MG    venlafaxine XR (EFFEXOR-XR) 75 MG 24 hr capsule TAKE 1 CAPSULE BY MOUTH ONCE DAILY. TAKEIN ADDITION TO THE 150 MG CAPSULE FOR A TOTAL DOSE OF 225MG  DAILY   vitamin B-12 (CYANOCOBALAMIN) 1000 MCG tablet Take 1,000 mcg by mouth daily.   No facility-administered encounter medications on file as of 03/18/2021.      Recent Relevant Labs: Lab Results  Component Value Date/Time   HGBA1C 7.7 (H) 11/02/2020 08:42 AM   HGBA1C 8.6 (H) 07/31/2020 08:41 AM   HGBA1C 9.6 10/02/2018 12:00 AM   MICROALBUR <0.7 02/21/2017 01:57 PM   MICROALBUR 0.8 02/16/2016 08:24 AM    Kidney Function Lab Results  Component Value Date/Time   CREATININE 1.05 11/02/2020 08:42 AM   CREATININE 1.00 07/31/2020 08:41 AM   GFR 71.68 11/02/2020 08:42 AM   GFRNONAA >60 04/02/2020 06:17 AM   GFRAA >60 04/02/2020 06:17 AM    Attempted contact with 01/02/2021 3 times on 03/18/21/,03/19/21,03/23/21. Unsuccessful outreach. Will attempt contact next  month.    Current antihyperglycemic regimen:  Trulicity 4.5 mg - Inject once weekly (thursdays) [name] - Inject 60 units every day Insulin aspart - Inject 13 units in morning and 8 in evening before meals   Adherence Review: Is the patient currently on a STATIN medication? Yes Is the patient currently on ACE/ARB medication? Yes Does the patient have >5 day gap between last estimated fill dates? No  Last annual wellness visit: 11/12/20  Star Rating Drugs:  Medication:  Last Fill: Day Supply Novolog  03/17/21 71 Tresiba  02/27/21  25 Rosuvastatin 40mg  02/03/21  88 Losartan 50mg  01/15/21 90 Trulicity  PAP  No appointments  scheduled within the next 30 days.  , CPP notified  01/17/21, [facility] Clincal Pharmacy Assistant [phone]  I have reviewed the care management and care coordination activities outlined in this encounter and I am certifying that I agree with the content of this note. Patient cancelled PCP visit in June. This needs to be rescheduled. He is due for diabetes visit.  [phone], PharmD Clinical Pharmacist [facility] at [facility] [phone]

## 2021-03-24 ENCOUNTER — Other Ambulatory Visit: Payer: Self-pay | Admitting: Family Medicine

## 2021-03-25 NOTE — Telephone Encounter (Signed)
Last office visit 11/12/2020 for CPE.  Last refilled 02/11/2021 for #60 with no refills.  No future appointments with PCP.

## 2021-03-29 ENCOUNTER — Telehealth: Payer: Self-pay

## 2021-03-29 NOTE — Telephone Encounter (Signed)
Patient called. He stopped statin due to severe back and joint aches, fatigue. stomach pain. After 3-4 days he felt much better. This was on rosuvastatin 40 mg twice weekly. He continues zetia daily. Removed statin from med list.  [name], PharmD Clinical Pharmacist [facility] at [facility] [phone]

## 2021-03-30 ENCOUNTER — Encounter: Payer: Self-pay | Admitting: Family Medicine

## 2021-03-30 DIAGNOSIS — G720 Drug-induced myopathy: Secondary | ICD-10-CM | POA: Insufficient documentation

## 2021-04-08 ENCOUNTER — Other Ambulatory Visit: Payer: Self-pay | Admitting: Family Medicine

## 2021-04-08 NOTE — Telephone Encounter (Signed)
Please schedule appointment to follow up with Dr. Ermalene Searing on Diabetes.  He was suppose to have followed up in June.

## 2021-04-09 ENCOUNTER — Telehealth: Payer: Self-pay

## 2021-04-09 NOTE — Chronic Care Management (AMB) (Addendum)
Chronic Care Management Pharmacy Assistant   Name: [name]  MRN: [id number] DOB: Apr 07, 1951  Reason for Encounter: Adherence Review   Recent office visits:  None since last CCM contact  Recent consult visits:  None since last CCM  contact  Hospital visits:  None in previous 6 months  Medications: Outpatient Encounter Medications as of 04/09/2021  Medication Sig   aspirin 81 MG EC tablet Take 1 tablet (81 mg total) by mouth daily.   carvedilol (COREG) 6.25 MG tablet TAKE 1 TABLET BY MOUTH ONCE DAILY   cholecalciferol (VITAMIN D3) 25 MCG (1000 UNIT) tablet Take 2,000 Units by mouth daily.   clopidogrel (PLAVIX) 75 MG tablet TAKE 1 TABLET BY MOUTH ONCE DAILY WITH BREAKFAST   Continuous Blood Gluc Receiver (FREESTYLE LIBRE 14 DAY READER) DEVI 1 Device by Does not apply route every 14 (fourteen) days.   Continuous Blood Gluc Sensor (FREESTYLE LIBRE 14 DAY SENSOR) MISC USE TO CHECK BLOOD SUGAR AS DIRECTED. CHANGE EVERY 14 DAYS.   Dulaglutide 4.5 MG/0.5ML SOPN Inject 4.5 mg into the skin. Inject 4.5 mg once weekly (patient receives from [employer])   ezetimibe (ZETIA) 10 MG tablet TAKE 1 TABLET BY MOUTH ONCE DAILY   furosemide (LASIX) 40 MG tablet TAKE 1 TABLET BY MOUTH DAILY CAN TAKE A SECOND DAILY DOSE AS NEEDED   Insulin Aspart FlexPen 100 UNIT/ML SOPN Inject 13 units in the morning and 8 units in the evening   insulin degludec (TRESIBA) 100 UNIT/ML FlexTouch Pen Inject 60 Units into the skin daily.   Insulin Pen Needle (PEN NEEDLES) 31G X 6 MM MISC Use to inject insulin 3 times a day   isosorbide mononitrate (IMDUR) 30 MG 24 hr tablet TAKE 1 TABLET BY MOUTH TWICE DAILY   losartan (COZAAR) 50 MG tablet TAKE 1 TABLET BY MOUTH ONCE DAILY   nitroGLYCERIN (NITROSTAT) 0.4 MG SL tablet DISSOLVE 1 TABLET UNDER TONGUE AS NEEDEDFOR CHEST PAIN. MAY REPEAT 5 MINUTES APART 3 TIMES IF NEEDED   potassium chloride SA (K-DUR) 20 MEQ tablet Take 1 tablet (20 mEq total) by mouth daily.    traMADol (ULTRAM) 50 MG tablet TAKE 1 TABLET BY MOUTH EVERY 12 HOURS ASNEEDED (Patient not taking: Reported on 03/29/2021)   venlafaxine XR (EFFEXOR-XR) 150 MG 24 hr capsule TAKE 1 CAPSULE BY MOUTH DAILY WITH BREAKFAST. TAKE WITH EFFEXOR XR 75MG  FORA TOTAL OF 225MG    venlafaxine XR (EFFEXOR-XR) 75 MG 24 hr capsule TAKE 1 CAPSULE BY MOUTH ONCE DAILY. TAKEIN ADDITION TO THE 150 MG CAPSULE FOR A TOTAL DOSE OF 225MG  DAILY   vitamin B-12 (CYANOCOBALAMIN) 1000 MCG tablet Take 1,000 mcg by mouth daily.   No facility-administered encounter medications on file as of 04/09/2021.    Contacted on 04/13/2021 for general disease state and medication adherence call.   Patient is not > 5 days past due for refill on the following medications per chart history:  Star Medications: Medication Name/mg Last Fill Days Supply Tresiba 100unit/ml  03/31/21  25 Novolog 100unit/ml  03/17/21 71 Losartan 50mg   01/15/21 90 Trulicity 4.5mg /0.53ml  09/21/20 28 (PAP) (patient injects once weekly on Thursday am,  3 pens left)   What concerns do you have about your medications? The patient will discuss rosuvastatin  this coming Friday 04/16/21 with Dr.Bedsole    The patient denies side effects with his medications.   How often do you forget or accidentally miss a dose? Never  Do you use a pillbox? No The patient  states he is organized with his pill bottles   Are you having any problems [facility] your [facility]   Has the cost of your medications been a concern? Yes The patient reports he gets Trulicity from [employer])   Since last visit with CPP, the following interventions have been made:  stopped Rosuvastatin due to severe back pain , joint aches, fatigue, GI pain, will have follow up 04/16/21 with PCP  The patient has not had an ED visit since last contact.   The patient denies problems with their health.   he denies  concerns or questions for [name],  Pharm. D at this time.   Counseled patient on:  [place] job taking medications, Importance of taking medication daily without missed doses, Benefits of adherence packaging or a pillbox, and Access to CCM team for any cost, medication or pharmacy concerns.   Care Gaps: Last annual wellness visit: If Diabetic:  Last eye exam / retinopathy screening: up to date per PCP 11/12/20 Last diabetic foot exam:11/12/20  PCP appointment on 04/16/21 and CCM appointment on 05/17/21  [name], CPP notified  Burt Knack, [facility] Clincal Pharmacy Assistant [phone]  I have reviewed the care management and care coordination activities outlined in this encounter and I am certifying that I agree with the content of this note. No further action required.  [name], PharmD Clinical Pharmacist [facility] at [facility] [phone]

## 2021-04-16 ENCOUNTER — Other Ambulatory Visit: Payer: Self-pay

## 2021-04-16 ENCOUNTER — Ambulatory Visit (INDEPENDENT_AMBULATORY_CARE_PROVIDER_SITE_OTHER): Payer: HMO | Admitting: Family Medicine

## 2021-04-16 VITALS — BP 138/70 | HR 89 | Temp 97.9°F | Ht 65.5 in | Wt 280.2 lb

## 2021-04-16 DIAGNOSIS — E1165 Type 2 diabetes mellitus with hyperglycemia: Secondary | ICD-10-CM | POA: Diagnosis not present

## 2021-04-16 DIAGNOSIS — IMO0002 Reserved for concepts with insufficient information to code with codable children: Secondary | ICD-10-CM

## 2021-04-16 DIAGNOSIS — E1139 Type 2 diabetes mellitus with other diabetic ophthalmic complication: Secondary | ICD-10-CM | POA: Diagnosis not present

## 2021-04-16 DIAGNOSIS — M545 Low back pain, unspecified: Secondary | ICD-10-CM | POA: Diagnosis not present

## 2021-04-16 DIAGNOSIS — G8929 Other chronic pain: Secondary | ICD-10-CM

## 2021-04-16 LAB — POCT GLYCOSYLATED HEMOGLOBIN (HGB A1C): Hemoglobin A1C: 7.6 % — AB (ref 4.0–5.6)

## 2021-04-16 MED ORDER — HYDROCODONE-ACETAMINOPHEN 5-325 MG PO TABS: 1.0000 | ORAL_TABLET | Freq: Four times a day (QID) | ORAL | 0 refills | Status: DC | PRN

## 2021-04-16 NOTE — Patient Instructions (Addendum)
We will work on referral for pain in low back.  Use hydrocodone for severe back pain, limit as much as able. Work on low carb diet,walking some as able.

## 2021-04-16 NOTE — Progress Notes (Signed)
Patient ID: [name], male    DOB: 20-Aug-1951, 70 y.o.   MRN: [id number]  This visit was conducted in person.  BP 138/70   Pulse 89   Temp 97.9 F (36.6 C) (Temporal)   Ht 5' 5.5" (1.664 m)   Wt 280 lb 4 oz (127.1 kg)   SpO2 97%   BMI 45.93 kg/m    CC: Chief Complaint  Patient presents with   Diabetes   Back Pain    Tramadol not helping-Patient quit taking    Subjective:   HPI: [name] is a 70 y.o. male presenting on 04/16/2021 for Diabetes and Back Pain (Tramadol not helping-Patient quit taking)  Diabetes:   Improving control .Marland Kitchen A1C down from 8.6, 7.7 On trulicity 3 mg weekly and Tresieba 60 units daily, using Aspart Inject 13 units in the morning and 8 units in the evening Lab Results  Component Value Date   HGBA1C 7.6 (A) 04/16/2021  Using medications without difficulties: none Hypoglycemic episodes: none Hyperglycemic episodes:  occ after eating poorly Feet problems: no ulcers Blood Sugars averaging: FBS 980-120 eye exam within last year: DR per pt.Marland Kitchen at [facility] [place] doctor [facility] Readings from Last 3 Encounters:  04/16/21 280 lb 4 oz (127.1 kg)  02/17/21 279 lb 4 oz (126.7 kg)  11/12/20 288 lb 12 oz (131 kg)  He feels he has lost 15 lbs overall on home scale    Chronic low back pain: tramadol was not helpful.  10/2020 lumbar film showed:  Degenerative disease with disc height loss throughout the lumbar spine most severe at L3-4 and L4-5. Bilateral facet arthropathy of the lumbar spine. No acute osseous injury of the lumbar spine.      Relevant past medical, surgical, family and social history reviewed and updated as indicated. Interim medical history since our last visit reviewed. Allergies and medications reviewed and updated. Outpatient Medications Prior to Visit  Medication Sig Dispense Refill   aspirin 81 MG EC tablet Take 1 tablet (81 mg total) by mouth daily.     carvedilol (COREG) 6.25 MG tablet TAKE 1 TABLET BY MOUTH ONCE DAILY 90 tablet 0    cholecalciferol (VITAMIN D3) 25 MCG (1000 UNIT) tablet Take 2,000 Units by mouth daily.     clopidogrel (PLAVIX) 75 MG tablet TAKE 1 TABLET BY MOUTH ONCE DAILY WITH BREAKFAST 90 tablet 0   Continuous Blood Gluc Receiver (FREESTYLE LIBRE 14 DAY READER) DEVI 1 Device by Does not apply route every 14 (fourteen) days. 1 Device 0   Continuous Blood Gluc Sensor (FREESTYLE LIBRE 14 DAY SENSOR) MISC USE TO CHECK BLOOD SUGAR AS DIRECTED. CHANGE EVERY 14 DAYS. 2 each 11   Dulaglutide 4.5 MG/0.5ML SOPN Inject 4.5 mg into the skin. Inject 4.5 mg once weekly (patient receives from [employer])     ezetimibe (ZETIA) 10 MG tablet TAKE 1 TABLET BY MOUTH ONCE DAILY 30 tablet 10   furosemide (LASIX) 40 MG tablet TAKE 1 TABLET BY MOUTH DAILY CAN TAKE A SECOND DAILY DOSE AS NEEDED 180 tablet 1   Insulin Aspart FlexPen 100 UNIT/ML SOPN Inject 13 units in the morning and 8 units in the evening 15 mL 6   insulin degludec (TRESIBA) 100 UNIT/ML FlexTouch Pen Inject 60 Units into the skin daily. 15 mL 3   Insulin Pen Needle (PEN NEEDLES) 31G X 6 MM MISC Use to inject insulin 3 times a day 300 each 3   isosorbide mononitrate (IMDUR) 30 MG 24 hr tablet TAKE  1 TABLET BY MOUTH TWICE DAILY 180 tablet 2   losartan (COZAAR) 50 MG tablet TAKE 1 TABLET BY MOUTH ONCE DAILY 90 tablet 1   nitroGLYCERIN (NITROSTAT) 0.4 MG SL tablet DISSOLVE 1 TABLET UNDER TONGUE AS NEEDEDFOR CHEST PAIN. MAY REPEAT 5 MINUTES APART 3 TIMES IF NEEDED 25 tablet 3   potassium chloride SA (K-DUR) 20 MEQ tablet Take 1 tablet (20 mEq total) by mouth daily. 90 tablet 1   venlafaxine XR (EFFEXOR-XR) 150 MG 24 hr capsule TAKE 1 CAPSULE BY MOUTH DAILY WITH BREAKFAST. TAKE WITH EFFEXOR XR 75MG  FORA TOTAL OF 225MG  90 capsule 1   venlafaxine XR (EFFEXOR-XR) 75 MG 24 hr capsule TAKE 1 CAPSULE BY MOUTH ONCE DAILY. TAKEIN ADDITION TO THE 150 MG CAPSULE FOR A TOTAL DOSE OF 225MG  DAILY 90 capsule 1   vitamin B-12 (CYANOCOBALAMIN) 1000 MCG tablet Take 1,000 mcg by mouth daily.      traMADol (ULTRAM) 50 MG tablet TAKE 1 TABLET BY MOUTH EVERY 12 HOURS ASNEEDED (Patient not taking: No sig reported) 60 tablet 0   No facility-administered medications prior to visit.     Per HPI unless specifically indicated in ROS section below Review of Systems  Constitutional:  Negative for fatigue and fever.  HENT:  Negative for ear pain.   Eyes:  Negative for pain.  Respiratory:  Negative for cough and shortness of breath.   Cardiovascular:  Negative for chest pain, palpitations and leg swelling.  Gastrointestinal:  Negative for abdominal pain.  Genitourinary:  Negative for dysuria.  Musculoskeletal:  Negative for arthralgias.  Neurological:  Negative for syncope, light-headedness and headaches.  Psychiatric/Behavioral:  Negative for dysphoric mood.   Objective:  BP 138/70   Pulse 89   Temp 97.9 F (36.6 C) (Temporal)   Ht 5' 5.5" (1.664 m)   Wt 280 lb 4 oz (127.1 kg)   SpO2 97%   BMI 45.93 kg/m   Wt Readings from Last 3 Encounters:  04/16/21 280 lb 4 oz (127.1 kg)  02/17/21 279 lb 4 oz (126.7 kg)  11/12/20 288 lb 12 oz (131 kg)      Physical Exam Constitutional:      Appearance: He is well-developed. He is obese.  HENT:     Head: Normocephalic.     Right Ear: Hearing normal.     Left Ear: Hearing normal.     Nose: Nose normal.  Neck:     Thyroid: No thyroid mass or thyromegaly.     Vascular: No carotid bruit.     Trachea: Trachea normal.  Cardiovascular:     Rate and Rhythm: Normal rate and regular rhythm.     Pulses: Normal pulses.     Heart sounds: Heart sounds not distant. No murmur heard.   No friction rub. No gallop.     Comments: No peripheral edema Pulmonary:     Effort: Pulmonary effort is normal. No respiratory distress.     Breath sounds: Normal breath sounds.  Skin:    General: Skin is warm and dry.     Findings: No rash.  Psychiatric:        Speech: Speech normal.        Behavior: Behavior normal.        Thought Content: Thought  content normal.      Results for orders placed or performed in visit on 04/16/21  POCT glycosylated hemoglobin (Hb A1C)  Result Value Ref Range   Hemoglobin A1C 7.6 (A) 4.0 - 5.6 %  HbA1c POC (<> result, manual entry)     HbA1c, POC (prediabetic range)     HbA1c, POC (controlled diabetic range)      This visit occurred during the SARS-CoV-2 public health emergency.  Safety protocols were in place, including screening questions prior to the visit, additional usage of staff PPE, and extensive cleaning of exam room while observing appropriate contact time as indicated for disinfecting solutions.   COVID 19 screen:  No recent travel or known exposure to COVID19 The patient denies respiratory symptoms of COVID 19 at this time. The importance of social distancing was discussed today.   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 Trial of narcotic pain medication to treat given no relief from tramadol.  referred to physical Medicine and Rehab for further treatment options.        Relevant Orders   Ambulatory referral to Physical Medicine Rehab   Meds ordered this encounter  Medications   DISCONTD: HYDROcodone-acetaminophen (NORCO/VICODIN) 5-325 MG tablet    Sig: Take 1 tablet by mouth every 6 (six) hours as needed for moderate pain.    Dispense:  30 tablet    Refill:  0       Kerby Nora, MD

## 2021-04-19 ENCOUNTER — Other Ambulatory Visit: Payer: Self-pay | Admitting: Cardiovascular Disease

## 2021-04-20 ENCOUNTER — Encounter: Payer: Self-pay | Admitting: Family Medicine

## 2021-04-28 ENCOUNTER — Other Ambulatory Visit: Payer: Self-pay | Admitting: Family Medicine

## 2021-04-28 ENCOUNTER — Telehealth: Payer: Self-pay

## 2021-04-28 NOTE — Telephone Encounter (Signed)
Patient left voicemail this morning requesting a refill on hydrocodone. Stated he takes 2 a day therefore he ran out already. Will forward request to PCP.   [name], PharmD Clinical Pharmacist [facility] at [facility] [phone]

## 2021-04-28 NOTE — Telephone Encounter (Signed)
Call patient.Marland Kitchen as discussed at OV this medication was to be used once daily prn.. rx was written q 6 hours so he could get 30 tabs at a time.  I will send in another rx but the expectation ( and prescription) going forward is to limit the dose to once daily. If taking it once daly is not adequate.. he can follow up sooner than  planned 06/2021 OV.

## 2021-04-28 NOTE — Telephone Encounter (Signed)
[name] notified as instructed by telephone.  He states he has been taking one tablet twice a day and it has really helped with his pain.  Please advise about follow up.

## 2021-04-28 NOTE — Telephone Encounter (Signed)
Call pt as below

## 2021-04-28 NOTE — Telephone Encounter (Signed)
Last office visit 04/16/2021 for DM/Back Pain.  Last refilled 04/16/21 for #30 with no refills.  Next Appt: 07/20/2021 for DM.

## 2021-04-29 NOTE — Telephone Encounter (Signed)
I spoke with [name] yesterday.   Refill was sent in on Wednesday.  It should be available for patient at his pharmacy.

## 2021-04-30 ENCOUNTER — Telehealth: Payer: Self-pay

## 2021-04-30 NOTE — Telephone Encounter (Signed)
He needs an appointment with Dr. Ermalene Searing in 2 weeks to discuss pain management.

## 2021-04-30 NOTE — Telephone Encounter (Signed)
Left message for [name] to call the office next week and schedule 2 week appointment with Dr. Ermalene Searing for chronic pain management.

## 2021-04-30 NOTE — Telephone Encounter (Signed)
Called patient to follow up on voicemail about hydrocodone dosing. See telephone note 04/28/21.

## 2021-04-30 NOTE — Telephone Encounter (Signed)
I called patient to verify he received the medication. He affirms and I misunderstood his initial voicemail. He received hydrocodone refill but was concerned the prescription is for once daily when he takes one at 9 AM and one at 8 PM. He is requesting the next refill for BID if PCP okay with this. He states BID has been very helpful for his pain.  I asked about phys med rehab referral. He states he was called about referral for PT but he is not interested in PT. He would be willing to see a pain specialist if necessary but his happy with the hydrocodone BID.  [name], PharmD Clinical Pharmacist [facility] at [facility] [phone]

## 2021-05-04 NOTE — Telephone Encounter (Signed)
Pt has been scheduled.  °

## 2021-05-10 ENCOUNTER — Telehealth: Payer: Self-pay

## 2021-05-10 NOTE — Progress Notes (Addendum)
Chronic Care Management Pharmacy Assistant   Name: [name]  MRN: [id number] DOB: 1951-07-13  Reason for Encounter: Appointment Reminder   Medications: Outpatient Encounter Medications as of 05/10/2021  Medication Sig   aspirin 81 MG EC tablet Take 1 tablet (81 mg total) by mouth daily.   carvedilol (COREG) 6.25 MG tablet TAKE 1 TABLET BY MOUTH ONCE DAILY   cholecalciferol (VITAMIN D3) 25 MCG (1000 UNIT) tablet Take 2,000 Units by mouth daily.   clopidogrel (PLAVIX) 75 MG tablet TAKE 1 TABLET BY MOUTH ONCE DAILY WITH BREAKFAST   Continuous Blood Gluc Receiver (FREESTYLE LIBRE 14 DAY READER) DEVI 1 Device by Does not apply route every 14 (fourteen) days.   Continuous Blood Gluc Sensor (FREESTYLE LIBRE 14 DAY SENSOR) MISC USE TO CHECK BLOOD SUGAR AS DIRECTED. CHANGE EVERY 14 DAYS.   Dulaglutide 4.5 MG/0.5ML SOPN Inject 4.5 mg into the skin. Inject 4.5 mg once weekly (patient receives from [employer])   ezetimibe (ZETIA) 10 MG tablet TAKE 1 TABLET BY MOUTH ONCE DAILY   furosemide (LASIX) 40 MG tablet TAKE 1 TABLET BY MOUTH DAILY CAN TAKE A SECOND DAILY DOSE AS NEEDED   HYDROcodone-acetaminophen (NORCO/VICODIN) 5-325 MG tablet Take 1 tablet by mouth daily as needed for moderate pain.   Insulin Aspart FlexPen 100 UNIT/ML SOPN Inject 13 units in the morning and 8 units in the evening   insulin degludec (TRESIBA) 100 UNIT/ML FlexTouch Pen Inject 60 Units into the skin daily.   Insulin Pen Needle (PEN NEEDLES) 31G X 6 MM MISC Use to inject insulin 3 times a day   isosorbide mononitrate (IMDUR) 30 MG 24 hr tablet TAKE 1 TABLET BY MOUTH TWICE DAILY   losartan (COZAAR) 50 MG tablet TAKE 1 TABLET BY MOUTH ONCE DAILY   nitroGLYCERIN (NITROSTAT) 0.4 MG SL tablet DISSOLVE 1 TABLET UNDER TONGUE AS NEEDEDFOR CHEST PAIN. MAY REPEAT 5 MINUTES APART 3 TIMES IF NEEDED   potassium chloride SA (K-DUR) 20 MEQ tablet Take 1 tablet (20 mEq total) by mouth daily.   venlafaxine XR (EFFEXOR-XR) 150 MG 24 hr  capsule TAKE 1 CAPSULE BY MOUTH DAILY WITH BREAKFAST. TAKE WITH EFFEXOR XR 75MG  FORA TOTAL OF 225MG    venlafaxine XR (EFFEXOR-XR) 75 MG 24 hr capsule TAKE 1 CAPSULE BY MOUTH ONCE DAILY. TAKEIN ADDITION TO THE 150 MG CAPSULE FOR A TOTAL DOSE OF 225MG  DAILY   vitamin B-12 (CYANOCOBALAMIN) 1000 MCG tablet Take 1,000 mcg by mouth daily.   No facility-administered encounter medications on file as of 05/10/2021.   A voicemail was left on 05/10/2021 to remind him of his upcoming telephone visit with on 05/17/2021 at 11:00 am. Patient was reminded to have all medications, supplements and any blood glucose and blood pressure readings available for review at appointment.   Star Rating Drugs: Medication:   Last Fill: Day Supply Tresiba 100unit/ml                  04/23/2021 25 Novolog 100unit/ml                 03/17/2021      71 Losartan 50mg                         04/16/2021      90 Trulicity 4.5mg /0.63ml               09/21/2020      28 (PAP)  (Unable to verify with patient if he  has had it refilled since)  [name], CPP notified  Claudina Lick, [place] Clinical Pharmacy Assistant [phone]  I have reviewed the care management and care coordination activities outlined in this encounter and I am certifying that I agree with the content of this note. No further action required.  [name], PharmD Clinical Pharmacist [facility] at [facility] [phone]

## 2021-05-10 NOTE — Chronic Care Management (AMB) (Signed)
Entered in Error

## 2021-05-11 DIAGNOSIS — H35372 Puckering of macula, left eye: Secondary | ICD-10-CM | POA: Diagnosis not present

## 2021-05-11 DIAGNOSIS — H34812 Central retinal vein occlusion, left eye, with macular edema: Secondary | ICD-10-CM | POA: Diagnosis not present

## 2021-05-11 DIAGNOSIS — H3563 Retinal hemorrhage, bilateral: Secondary | ICD-10-CM | POA: Diagnosis not present

## 2021-05-11 DIAGNOSIS — H34813 Central retinal vein occlusion, bilateral, with macular edema: Secondary | ICD-10-CM | POA: Diagnosis not present

## 2021-05-11 DIAGNOSIS — H3582 Retinal ischemia: Secondary | ICD-10-CM | POA: Diagnosis not present

## 2021-05-13 ENCOUNTER — Other Ambulatory Visit: Payer: Self-pay

## 2021-05-13 ENCOUNTER — Encounter: Payer: Self-pay | Admitting: Family Medicine

## 2021-05-13 ENCOUNTER — Telehealth (INDEPENDENT_AMBULATORY_CARE_PROVIDER_SITE_OTHER): Payer: HMO | Admitting: Family Medicine

## 2021-05-13 VITALS — Ht 65.5 in

## 2021-05-13 DIAGNOSIS — M545 Low back pain, unspecified: Secondary | ICD-10-CM | POA: Diagnosis not present

## 2021-05-13 DIAGNOSIS — G8929 Other chronic pain: Secondary | ICD-10-CM | POA: Diagnosis not present

## 2021-05-13 DIAGNOSIS — Z7689 Persons encountering health services in other specified circumstances: Secondary | ICD-10-CM | POA: Insufficient documentation

## 2021-05-13 MED ORDER — HYDROCODONE-ACETAMINOPHEN 5-325 MG PO TABS: 1.0000 | ORAL_TABLET | Freq: Two times a day (BID) | ORAL | 0 refills | Status: DC | PRN

## 2021-05-13 NOTE — Assessment & Plan Note (Signed)
Will increaseing dosing to BID as this controls pain best.  No red flags . PDMP reviewed during this encounter. Given this will not be long term med.. he will return for UDS and controlled substance contract ( info reviewed with patient).  Once this is obtained.. I will send in 2 more refills future dated to last until q3 month follow up.

## 2021-05-13 NOTE — Progress Notes (Signed)
TELEPHONE VISIT  Due to national recommendations of social distancing due to COVID 19, Audio telehealth visit is felt to be most appropriate for this patient at this time.   I connected with [name] on 05/13/21 at  3:00 PM EDT by telephone and verified that I am speaking with the correct person using two identifiers.   I discussed the limitations, risks, security and privacy concerns of performing an evaluation and management service by telephone and the availability of in person appointments. I also discussed with the patient that there may be a patient responsible charge related to this service. The patient expressed understanding and agreed to proceed.  Patient location: Home Provider Location: [place] [facility] Participants: Kerby Nora and [name]   History of Present Illness:  Chief Complaint  Patient presents with   Pain Management    Indication for chronic opioid: chronic back pain Medication and dose: hydrocodone 1 tablet  daily as needed.Marland Kitchen  He reports pain is controlled  for 10-12 hours.  He is requiring BID for control of pain 8 PM and again at 8 PM. No constipation, no sleepy, no nausea. No falls, not unsteady. # pills per month: 30 but not lasting full month Last UDS date: none Opioid Treatment Agreement signed (Y/N): none Opioid Treatment Agreement last reviewed with patient:  05/13/21  [place]CC[place] reviewed this encounter (include red flags):   PDMP reviewed during this encounter.    He need to have UDS and controlled substance paperwork completed.  COVID 19 screen No recent travel or known exposure to COVID19 The patient denies respiratory symptoms of COVID 19 at this time.  The importance of social distancing was discussed today.   Review of Systems  Constitutional:  Positive for malaise/fatigue. Negative for chills and fever.  HENT:  Negative for congestion and ear pain.   Eyes:  Negative for pain and redness.  Respiratory:  Negative for cough and  shortness of breath.   Cardiovascular:  Negative for chest pain, palpitations and leg swelling.  Gastrointestinal:  Negative for abdominal pain, blood in stool, constipation, diarrhea, nausea and vomiting.  Genitourinary:  Negative for dysuria.  Musculoskeletal:  Positive for back pain. Negative for falls and myalgias.  Skin:  Negative for rash.  Neurological:  Negative for dizziness.  Psychiatric/Behavioral:  Negative for depression. The patient is not nervous/anxious.      Past Medical History:  Diagnosis Date   Back injury 02/2002   worker's comp   CHF (congestive heart failure) (HCC)    Coronary artery disease, non-occlusive    a. cath 2002 with no sig CAD;  b. cath 2008 normal LM, LAD, LCx, p&dRCA 20-30%, PDA 30%; c.11/2015 NSTEMI/PCI: LM nl, LAD 95p (2.5x15 Xience DES), LCX nl, RCA 100p/m w/ L->R collats, EF 55-65% c. NSTEMI (02/2016) with no culprit leision, switched to Brilinta.  d. NSTEMI 03/2016: again, no culprit lesion and switched back to plavix 2/2 SOB with Brilnta.     Depression    Diabetes mellitus type 2, insulin dependent (HCC)    Hyperlipemia    Hypertensive heart disease    Kidney stones    Morbid obesity (HCC)    Osteoarthritis    Snoring     reports that he has never smoked. He has never used smokeless tobacco. He reports that he does not drink alcohol and does not use drugs.   Current Outpatient Medications:    aspirin 81 MG EC tablet, Take 1 tablet (81 mg total) by mouth daily., Disp: ,  Rfl:    carvedilol (COREG) 6.25 MG tablet, TAKE 1 TABLET BY MOUTH ONCE DAILY, Disp: 90 tablet, Rfl: 3   cholecalciferol (VITAMIN D3) 25 MCG (1000 UNIT) tablet, Take 2,000 Units by mouth daily., Disp: , Rfl:    clopidogrel (PLAVIX) 75 MG tablet, TAKE 1 TABLET BY MOUTH ONCE DAILY WITH BREAKFAST, Disp: 90 tablet, Rfl: 0   Continuous Blood Gluc Receiver (FREESTYLE LIBRE 14 DAY READER) DEVI, 1 Device by Does not apply route every 14 (fourteen) days., Disp: 1 Device, Rfl: 0   Continuous  Blood Gluc Sensor (FREESTYLE LIBRE 14 DAY SENSOR) MISC, USE TO CHECK BLOOD SUGAR AS DIRECTED. CHANGE EVERY 14 DAYS., Disp: 2 each, Rfl: 11   Dulaglutide 4.5 MG/0.5ML SOPN, Inject 4.5 mg into the skin. Inject 4.5 mg once weekly (patient receives from [employer]), Disp: , Rfl:    ezetimibe (ZETIA) 10 MG tablet, TAKE 1 TABLET BY MOUTH ONCE DAILY, Disp: 30 tablet, Rfl: 10   furosemide (LASIX) 40 MG tablet, TAKE 1 TABLET BY MOUTH DAILY CAN TAKE A SECOND DAILY DOSE AS NEEDED, Disp: 180 tablet, Rfl: 1   HYDROcodone-acetaminophen (NORCO/VICODIN) 5-325 MG tablet, Take 1 tablet by mouth daily as needed for moderate pain. (Patient taking differently: Take 1 tablet by mouth in the morning and at bedtime.), Disp: 30 tablet, Rfl: 0   Insulin Aspart FlexPen 100 UNIT/ML SOPN, Inject 13 units in the morning and 8 units in the evening, Disp: 15 mL, Rfl: 6   insulin degludec (TRESIBA) 100 UNIT/ML FlexTouch Pen, Inject 60 Units into the skin daily., Disp: 15 mL, Rfl: 3   Insulin Pen Needle (PEN NEEDLES) 31G X 6 MM MISC, Use to inject insulin 3 times a day, Disp: 300 each, Rfl: 3   isosorbide mononitrate (IMDUR) 30 MG 24 hr tablet, TAKE 1 TABLET BY MOUTH TWICE DAILY, Disp: 180 tablet, Rfl: 2   losartan (COZAAR) 50 MG tablet, TAKE 1 TABLET BY MOUTH ONCE DAILY, Disp: 90 tablet, Rfl: 1   nitroGLYCERIN (NITROSTAT) 0.4 MG SL tablet, DISSOLVE 1 TABLET UNDER TONGUE AS NEEDEDFOR CHEST PAIN. MAY REPEAT 5 MINUTES APART 3 TIMES IF NEEDED, Disp: 25 tablet, Rfl: 3   potassium chloride SA (K-DUR) 20 MEQ tablet, Take 1 tablet (20 mEq total) by mouth daily., Disp: 90 tablet, Rfl: 1   venlafaxine XR (EFFEXOR-XR) 150 MG 24 hr capsule, TAKE 1 CAPSULE BY MOUTH DAILY WITH BREAKFAST. TAKE WITH EFFEXOR XR 75MG  FORA TOTAL OF 225MG , Disp: 90 capsule, Rfl: 1   venlafaxine XR (EFFEXOR-XR) 75 MG 24 hr capsule, TAKE 1 CAPSULE BY MOUTH ONCE DAILY. TAKEIN ADDITION TO THE 150 MG CAPSULE FOR A TOTAL DOSE OF 225MG  DAILY, Disp: 90 capsule, Rfl: 1   vitamin  B-12 (CYANOCOBALAMIN) 1000 MCG tablet, Take 1,000 mcg by mouth daily., Disp: , Rfl:    Observations/Objective: Height 5' 5.5" (1.664 m).  Physical Exam  Physical Exam Constitutional:      General: The patient is not in acute distress. Pulmonary:     Effort: Pulmonary effort is normal. No respiratory distress.  Neurological:     Mental Status: The patient is alert and oriented to person, place, and time.  Psychiatric:        Mood and Affect: Mood normal.        Behavior: Behavior normal.   Assessment and Plan  I discussed the assessment and treatment plan with the patient. The patient was provided an opportunity to ask questions and all were answered. The patient agreed with the plan and demonstrated  an understanding of the instructions.   The patient was advised to call back or seek an in-person evaluation if the symptoms worsen or if the condition fails to improve as anticipated.  I provided 15 minutes of non-face-to-face time during this encounter.  Problem List Items Addressed This Visit     Chronic low back pain   Relevant Medications   HYDROcodone-acetaminophen (NORCO/VICODIN) 5-325 MG tablet   Other Relevant Orders   DRUG MONITORING, PANEL 8 WITH CONFIRMATION, URINE   Encounter for chronic pain management - Primary    Will increaseing dosing to BID as this controls pain best.  No red flags . PDMP reviewed during this encounter. Given this will not be long term med.. he will return for UDS and controlled substance contract ( info reviewed with patient).  Once this is obtained.. I will send in 2 more refills future dated to last until q3 month follow up.      Relevant Orders   DRUG MONITORING, PANEL 8 WITH CONFIRMATION, URINE     Kerby Nora, MD

## 2021-05-17 ENCOUNTER — Ambulatory Visit (INDEPENDENT_AMBULATORY_CARE_PROVIDER_SITE_OTHER): Payer: HMO

## 2021-05-17 ENCOUNTER — Other Ambulatory Visit: Payer: HMO

## 2021-05-17 ENCOUNTER — Other Ambulatory Visit: Payer: Self-pay

## 2021-05-17 DIAGNOSIS — G8929 Other chronic pain: Secondary | ICD-10-CM | POA: Diagnosis not present

## 2021-05-17 DIAGNOSIS — E785 Hyperlipidemia, unspecified: Secondary | ICD-10-CM

## 2021-05-17 DIAGNOSIS — IMO0002 Reserved for concepts with insufficient information to code with codable children: Secondary | ICD-10-CM

## 2021-05-17 DIAGNOSIS — E1169 Type 2 diabetes mellitus with other specified complication: Secondary | ICD-10-CM

## 2021-05-17 DIAGNOSIS — M545 Low back pain, unspecified: Secondary | ICD-10-CM | POA: Diagnosis not present

## 2021-05-17 DIAGNOSIS — E1139 Type 2 diabetes mellitus with other diabetic ophthalmic complication: Secondary | ICD-10-CM

## 2021-05-17 NOTE — Progress Notes (Signed)
Chronic Care Management Pharmacy Note  05/17/2021 Name:  [name]          MRN:  [id number] DOB:  September 23, 1950  Summary: CCM follow up diabetes. He feels a lot better on new insulin.  Home readings improved. Continues to use [place]. His fasting BG runs high 80s-100. After breakfast up to 180. After supper up to 220. He did have one episode BG < 70 after supper last week. This was due to eating smaller meal than usual. Discussed cholesterol. No longer on statin due to muscle pain with atorvastatin twice weekly (prev failed rosuvastatin). Continues Zetia. Will update lipids in December per patient report. We will re-eval need for further treatment at that point.   Recommendations: -Continue current medications  Plan: -PCP visit 12/22 -CCM visit 6 months   Subjective: [name] is an 70 y.o. year old male who is a primary patient of Bedsole, Amy E, MD.  The CCM team was consulted for assistance with disease management and care coordination needs.    Engaged with patient by telephone for follow up visit in response to provider referral for pharmacy case management and/or care coordination services. Contacted to review diabetes medications.  Consent to Services:  The patient was given information about Chronic Care Management services, agreed to services, and gave verbal consent prior to initiation of services.  Please see initial visit note for detailed documentation.   Patient Care Team: Jinny Sanders, MD as PCP - General Rockey Situ Kathlene November, MD as Consulting Physician (Cardiology) Debbora Dus, [facility] as Pharmacist (Pharmacist)  Recent office visits:  05/13/21 - PCP - Pain management visit. Increase hydrocodone to BID. 04/16/21 - PCP - Patient presented for diabetes. A1c 7.6% improved slightly from 7.7%. Referral to phys med rehab. Start hydrocodone-apap 5-325 mg every 6 hours PRN.  03/29/21 - Patient call - stopped atorvastatin due to muscle pain.   Recent consult visits:  None  since last CCM contact   Hospital visits:  None in previous 6 months  Objective:  Lab Results  Component Value Date   CREATININE 1.05 11/02/2020   BUN 14 11/02/2020   GFR 71.68 11/02/2020   GFRNONAA >60 04/02/2020   GFRAA >60 04/02/2020   NA 143 11/02/2020   K 4.5 11/02/2020   CALCIUM 9.1 11/02/2020   CO2 30 11/02/2020    Lab Results  Component Value Date/Time   HGBA1C 7.6 (A) 04/16/2021 12:13 PM   HGBA1C 7.7 (H) 11/02/2020 08:42 AM   HGBA1C 8.6 (H) 07/31/2020 08:41 AM   HGBA1C 9.6 10/02/2018 12:00 AM   GFR 71.68 11/02/2020 08:42 AM   GFR 76.14 07/31/2020 08:41 AM   MICROALBUR <0.7 02/21/2017 01:57 PM   MICROALBUR 0.8 02/16/2016 08:24 AM    Last diabetic Eye exam:  Lab Results  Component Value Date/Time   HMDIABEYEEXA Retinopathy (A) 01/26/2021 12:00 AM    Last diabetic Foot exam:  Lab Results  Component Value Date/Time   HMDIABFOOTEX done 11/12/2020 12:00 AM     Lab Results  Component Value Date   CHOL 107 11/02/2020   HDL 34.30 (L) 11/02/2020   LDLCALC 56 11/02/2020   LDLDIRECT 174.0 10/19/2017   TRIG 82.0 11/02/2020   CHOLHDL 3 11/02/2020    Hepatic Function Latest Ref Rng & Units 11/02/2020 07/31/2020 04/14/2020  Total Protein 6.0 - 8.3 g/dL 7.4 6.7 7.2  Albumin 3.5 - 5.2 g/dL 3.9 3.9 4.0  AST 0 - 37 U/L 18 12 16   ALT 0 - 53 U/L  14 12 19   Alk Phosphatase 39 - 117 U/L 77 85 85  Total Bilirubin 0.2 - 1.2 mg/dL 0.4 0.4 0.3  Bilirubin, Direct 0.0 - 0.3 mg/dL - - -    Lab Results  Component Value Date/Time   TSH 2.64 10/19/2017 04:00 PM   TSH 1.14 03/11/2014 10:26 AM   FREET4 0.79 10/19/2017 04:00 PM    CBC Latest Ref Rng & Units 07/31/2020 03/31/2020 10/18/2018  WBC 4.0 - 10.5 K/uL 6.6 7.0 7.3  Hemoglobin 13.0 - 17.0 g/dL 13.2 13.4 13.5  Hematocrit 39.0 - 52.0 % 40.2 41.0 39.8  Platelets 150.0 - 400.0 K/uL 198.0 224 218.0    Lab Results  Component Value Date/Time   VD25OH 37.72 07/31/2020 08:41 AM   VD25OH 47.26 02/27/2018 09:26 AM    Clinical  ASCVD: Yes  The ASCVD Risk score (Arnett DK, et al., 2019) failed to calculate for the following reasons:   The patient has a prior MI or stroke diagnosis    Depression screen [facility] 2/9 11/10/2020 06/12/2020 01/30/2020  Decreased Interest 1 3 3   Down, Depressed, Hopeless 1 3 3   PHQ - 2 Score 2 6 6   Altered sleeping 0 3 3  Tired, decreased energy 0 3 3  Change in appetite 0 2 0  Feeling bad or failure about yourself  0 0 0  Trouble concentrating 0 2 0  Moving slowly or fidgety/restless 0 2 0  Suicidal thoughts 0 0 0  PHQ-9 Score 2 18 12   Difficult doing work/chores Not difficult at all Extremely dIfficult Extremely dIfficult  Some recent data might be hidden    Social History   Tobacco Use  Smoking Status Never  Smokeless Tobacco Never   BP Readings from Last 3 Encounters:  04/16/21 138/70  02/17/21 134/80  11/12/20 140/62   Pulse Readings from Last 3 Encounters:  04/16/21 89  02/17/21 77  11/12/20 76   Wt Readings from Last 3 Encounters:  04/16/21 280 lb 4 oz (127.1 kg)  02/17/21 279 lb 4 oz (126.7 kg)  11/12/20 288 lb 12 oz (131 kg)    Assessment/Interventions: Review of patient past medical history, allergies, medications, health status, including review of consultants reports, laboratory and other test data, was performed as part of comprehensive evaluation and provision of chronic care management services.   SDOH:  (Social Determinants of Health) assessments and interventions performed: Yes - Trulicity PAP active    CCM Care Plan  Allergies  Allergen Reactions   Atorvastatin Other (See Comments)    Body aches Similar effect with rosuvastatin 40 mg twice weekly    Medications Reviewed Today     Reviewed by Debbora Dus, [facility] (Pharmacist) on 05/17/21 at 1125  Med List Status: <None>   Medication Order Taking? Sig Documenting Provider Last Dose Status Informant  aspirin 81 MG EC tablet [id number]  Take 1 tablet (81 mg total) by mouth daily. Theora Gianotti, NP  Active Self           Med Note Pilar Plate May 01, 2016  3:28 PM)    carvedilol (COREG) 6.25 MG tablet [id number]  TAKE 1 TABLET BY MOUTH ONCE DAILY Gollan, Kathlene November, MD  Active   cholecalciferol (VITAMIN D3) 25 MCG (1000 UNIT) tablet [id number]  Take 2,000 Units by mouth daily. [provider]  Active   clopidogrel (PLAVIX) 75 MG tablet [id number]  TAKE 1 TABLET BY MOUTH ONCE DAILY WITH BREAKFAST Bedsole, Amy E, MD  Active  Continuous Blood Gluc Receiver (FREESTYLE LIBRE 14 DAY READER) [place] [id number]  1 Device by Does not apply route every 14 (fourteen) days. Jinny Sanders, MD  Active Self  Continuous Blood Gluc Sensor (FREESTYLE LIBRE 14 DAY SENSOR) [place] [id number]  USE TO CHECK BLOOD SUGAR AS DIRECTED. CHANGE EVERY 14 DAYS. Jinny Sanders, MD  Active   Dulaglutide 4.5 MG/0.5ML SOPN [id number]  Inject 4.5 mg into the skin. Inject 4.5 mg once weekly (patient receives from [employer]) [provider]  Active Self  ezetimibe (ZETIA) 10 MG tablet [id number]  TAKE 1 TABLET BY MOUTH ONCE DAILY Gollan, Kathlene November, MD  Active   furosemide (LASIX) 40 MG tablet [id number]  TAKE 1 TABLET BY MOUTH DAILY CAN TAKE A SECOND DAILY DOSE AS NEEDED Bedsole, Amy E, MD  Active   HYDROcodone-acetaminophen (NORCO/VICODIN) 5-325 MG tablet [id number]  Take 1 tablet by mouth 2 (two) times daily as needed for moderate pain or severe pain. Jinny Sanders, MD  Active   Insulin Aspart FlexPen 100 UNIT/ML SOPN [id number] Yes Inject 13 units in the morning and 8 units in the evening Bedsole, Amy E, MD Taking Active   insulin degludec (TRESIBA) 100 UNIT/ML FlexTouch Pen [id number] Yes Inject 60 Units into the skin daily. Bedsole, Amy E, MD Taking Active   Insulin Pen Needle (PEN NEEDLES) 31G X 6 MM MISC [id number]  Use to inject insulin 3 times a day Bedsole, Amy E, MD  Active   isosorbide mononitrate (IMDUR) 30 MG 24 hr tablet [id number]  TAKE 1 TABLET BY MOUTH TWICE DAILY Gollan, Kathlene November, MD   Active   losartan (COZAAR) 50 MG tablet [id number]  TAKE 1 TABLET BY MOUTH ONCE DAILY Bedsole, Amy E, MD  Active   nitroGLYCERIN (NITROSTAT) 0.4 MG SL tablet [id number]  DISSOLVE 1 TABLET UNDER TONGUE AS NEEDEDFOR CHEST PAIN. MAY REPEAT 5 MINUTES APART 3 TIMES IF NEEDED Gollan, Kathlene November, MD  Active   potassium chloride SA (K-DUR) 20 MEQ tablet [id number]  Take 1 tablet (20 mEq total) by mouth daily. Jinny Sanders, MD  Active   venlafaxine XR (EFFEXOR-XR) 150 MG 24 hr capsule [id number]  TAKE 1 CAPSULE BY MOUTH DAILY WITH BREAKFAST. TAKE WITH EFFEXOR XR 75MG FORA TOTAL OF 225MG Bedsole, Amy E, MD  Active   venlafaxine XR (EFFEXOR-XR) 75 MG 24 hr capsule [id number]  TAKE 1 CAPSULE BY MOUTH ONCE DAILY. TAKEIN ADDITION TO THE 150 MG CAPSULE FOR A TOTAL DOSE OF 225MG DAILY Bedsole, Amy E, MD  Active   vitamin B-12 (CYANOCOBALAMIN) 1000 MCG tablet [id number]  Take 1,000 mcg by mouth daily. [provider]  Active Self            Patient Active Problem List   Diagnosis Date Noted   Encounter for chronic pain management 05/13/2021   Statin myopathy 03/30/2021   Chronic low back pain 11/12/2020   Hyperglycemia due to type 2 diabetes mellitus ([place]) 03/31/2020   ED (erectile dysfunction) 03/06/2018   Acute on chronic diastolic CHF (congestive heart failure) ([place]) 09/20/2016   Adjustment disorder with mixed anxiety and depressed mood 09/09/2016   Gastroesophageal reflux disease 09/09/2016   Coronary artery disease, non-occlusive    Elevated left ventricular end-diastolic pressure (LVEDP) 03/11/2016   Anemia 03/11/2016   Hypertensive heart disease with heart failure ([place])    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)    Coronary artery disease of native artery of native heart with stable angina pectoris ([place])  Morbid obesity ([place])    Chronic chest pain    NSTEMI (non-ST elevated myocardial infarction) ([place])    Advanced care planning/counseling discussion 11/20/2015    Noncompliance with diabetes treatment 11/05/2015   Noncompliance with diet and medication regimen 11/05/2015   Diabetic retinopathy ([place]) 09/23/2014   Snoring 12/21/2012   HYPOGONADISM 09/28/2010   B12 deficiency 09/28/2010   Vitamin D deficiency 09/28/2010   OTHER MALAISE AND FATIGUE 09/17/2010   TRIGGER FINGER, RIGHT MIDDLE 09/14/2010   BRANCH RETINAL VEIN OCCLUSION 11/26/2009   CONSTIPATION 08/10/2009   GASTRITIS 07/29/2009   Major depressive disorder, recurrent episode, moderate ([place]) 07/06/2007   ERECTILE DYSFUNCTION 04/10/2007   RENAL CALCULUS, HX OF 03/26/2007   Hyperlipidemia associated with type 2 diabetes mellitus ([place]) 12/05/2006   Hypertension associated with diabetes ([place]) 12/05/2006   Osteoarthritis 12/05/2006    Immunization History  Administered Date(s) Administered   Fluad Quad(high Dose 65+) 05/23/2020   Influenza Split 05/12/2011, 07/09/2012   Influenza Whole 06/30/2007, 05/29/2008, 07/03/2009   Influenza, High Dose Seasonal PF 05/14/2019   Influenza,inj,Quad PF,6+ Mos 05/31/2013, 07/09/2014, 06/25/2015, 07/05/2016, 06/22/2017, 06/11/2018   PFIZER(Purple Top)SARS-COV-2 Vaccination 10/28/2019, 11/18/2019, 05/29/2020   Pneumococcal Conjugate-13 02/14/2015   Pneumococcal Polysaccharide-23 06/30/2007, 12/21/2012, 02/27/2018   Td 06/30/2007    Conditions to be addressed/monitored:  Hyperlipidemia and Diabetes  Patient Care Plan: CCM Pharmacy Care Plan     Problem Identified: CHL AMB "PATIENT-SPECIFIC PROBLEM"      Long-Range Goal: Patient Stated   Start Date: 12/31/2020  This Visit's Progress: On track  Note:   Current Barriers:  None identified  Pharmacist Clinical Goal(s):  Patient will achieve control of diabetes as evidence by A1c and home BG monitoring through collaboration with PharmD and provider.   Interventions: 1:1 collaboration with Jinny Sanders, MD regarding development and update of comprehensive plan of care as evidenced by provider  attestation and co-signature Inter-disciplinary care team collaboration (see longitudinal plan of care) Comprehensive medication review performed; medication list updated in electronic medical record  Diabetes (A1c goal <7%) -Not ideally controlled - recent A1c improved to 7.6% and his home BG readings are much improved. He states he is feeling a whole lot better on new insulin regimen.  -Current medications: Trulicity 4.5 mg - Inject once weekly Tresiba - 60 units in the morning Novolog - 13 units before breakfast and 7 units before supper   -Medications previously tried:  metformin -Current home glucose readings - using libre, scanning 10-15 times daily Fasting glucose: 101 today, 97 yesterday, run in high 80s-90s in morning (highest 120) After meals: 180 after breakfast, skips lunch, 220-230 after supper Bedtime: 130s Hypoglycemia: Reports one low < 70 this month about 1 week ago, if he does not eat a big supper his BG drops too low. He has tried taking 10 units at supper and his BG drops too low. Encouraged him to sick with 7-8 units before supper.  -Current meal patterns: Daily routine - Sleeps from 3-4 AM until 10:30 or 11 AM, Eats BF at 11 AM. Evening meal (sandwich) at 4-6 PM. May have a pack of nabs before bed. -Reviewed rule of 15 for hypoglcyemia -Recommend continue current medications   Hyperlipidemia: (LDL goal < 70) -Query controlled - LDL 56 - March 2022,He stopped statin July 2022. He reports PCP will draw lipid panel at December visit. This is his second failed statin. He continues Zetia as prescribed. His cholesterol may have improved with weight loss and improved diabetes control this year. Will re-eval  after next labs. -Current treatment: Ezetimibe 10 mg - 1 tablet  -Medications previously tried: Daily Crestor - unable to tolerate, Lipitor twice a week - unable to tolerate -Educated on Cholesterol goals;  -Recommended to continue current medication  Patient  Goals/Self-Care Activities Patient will:  - continue to take medications as prescribed  - call if BG < 70      Star Rating Drugs:  Medication:                Last Fill:         Day Supply Trulicity 7.1GX              Patient assistance Losartan 50 mg           01/15/21            90  Follow Up Plan:  6 months   Medication Assistance:  Trulicity obtained through [facility] medication assistance program.  Enrollment ends 08/28/2021  Patient's preferred pharmacy is:  [facility] [place] [address] [address] Phone: [phone] Fax: [phone]  Uses pill box? Yes  Care Plan and Follow Up Patient Decision:  Patient agrees to Care Plan and Follow-up.  Debbora Dus, PharmD Clinical Pharmacist [facility] at [facility] [phone]

## 2021-05-17 NOTE — Patient Instructions (Signed)
Dear [name],  Below is a summary of the goals we discussed during our follow up appointment on May 17, 2021. Please contact me anytime with questions or concerns.   Visit Information  Patient Care Plan: CCM Pharmacy Care Plan     Problem Identified: CHL AMB "PATIENT-SPECIFIC PROBLEM"      Long-Range Goal: Patient Stated   Start Date: 12/31/2020  This Visit's Progress: On track  Note:   Current Barriers:  None identified  Pharmacist Clinical Goal(s):  Patient will achieve control of diabetes as evidence by A1c and home BG monitoring through collaboration with PharmD and provider.   Interventions: 1:1 collaboration with [name], MD regarding development and update of comprehensive plan of care as evidenced by provider attestation and co-signature Inter-disciplinary care team collaboration (see longitudinal plan of care) Comprehensive medication review performed; medication list updated in electronic medical record  Diabetes (A1c goal <7%) -Not ideally controlled - recent A1c improved to 7.6% and his home BG readings are much improved. He states he is feeling a whole lot better on new insulin regimen.  -Current medications: Trulicity 4.5 mg - Inject once weekly Tresiba - 60 units in the morning Novolog - 13 units before breakfast and 7 units before supper   -Medications previously tried:  metformin -Current home glucose readings - using libre, scanning 10-15 times daily Fasting glucose: 101 today, 97 yesterday, run in high 80s-90s in morning (highest 120) After meals: 180 after breakfast, skips lunch, 220-230 after supper Bedtime: 130s Hypoglycemia: Reports one low < 70 this month about 1 week ago, if he does not eat a big supper his BG drops too low. He has tried taking 10 units at supper and his BG drops too low. Encouraged him to sick with 7-8 units before supper.  -Current meal patterns: Daily routine - Sleeps from 3-4 AM until 10:30 or 11 AM, Eats BF at 11  AM. Evening meal (sandwich) at 4-6 PM. May have a pack of nabs before bed. -Reviewed rule of 15 for hypoglcyemia -Recommend continue current medications   Hyperlipidemia: (LDL goal < 70) -Query controlled - LDL 56 - March 2022,He stopped statin July 2022. He reports PCP will draw lipid panel at December visit. This is his second failed statin. He continues Zetia as prescribed. His cholesterol may have improved with weight loss and improved diabetes control this year. Will re-eval after next labs. -Current treatment: Ezetimibe 10 mg - 1 tablet  -Medications previously tried: Daily Crestor - unable to tolerate, Lipitor twice a week - unable to tolerate -Educated on Cholesterol goals;  -Recommended to continue current medication  Patient Goals/Self-Care Activities Patient will:  - continue to take medications as prescribed  - call if BG < 70      Patient verbalizes understanding of instructions provided today and agrees to view in MyChart.   [name], PharmD Clinical Pharmacist [facility] at [facility] [phone]

## 2021-05-20 ENCOUNTER — Telehealth: Payer: Self-pay | Admitting: Family Medicine

## 2021-05-20 LAB — DRUG MONITORING, PANEL 8 WITH CONFIRMATION, URINE
6 Acetylmorphine: NEGATIVE ng/mL (ref <10)
Alcohol Metabolites: NEGATIVE ng/mL (ref <500)
Amphetamines: NEGATIVE ng/mL (ref <500)
Benzodiazepines: NEGATIVE ng/mL (ref <100)
Buprenorphine, Urine: NEGATIVE ng/mL (ref <5)
Cocaine Metabolite: NEGATIVE ng/mL (ref <150)
Codeine: NEGATIVE ng/mL (ref <50)
Creatinine: 70.2 mg/dL (ref >=20.0)
Hydrocodone: 431 ng/mL — ABNORMAL HIGH (ref <50)
Hydromorphone: 69 ng/mL — ABNORMAL HIGH (ref <50)
MDMA: NEGATIVE ng/mL (ref <500)
Marijuana Metabolite: NEGATIVE ng/mL (ref <20)
Morphine: NEGATIVE ng/mL (ref <50)
Norhydrocodone: 331 ng/mL — ABNORMAL HIGH (ref <50)
Opiates: POSITIVE ng/mL — AB (ref <100)
Oxidant: NEGATIVE ug/mL (ref <200)
Oxycodone: NEGATIVE ng/mL (ref <100)
pH: 5.7 (ref 4.5–9.0)

## 2021-05-20 LAB — DM TEMPLATE

## 2021-05-20 MED ORDER — HYDROCODONE-ACETAMINOPHEN 5-325 MG PO TABS: 1.0000 | ORAL_TABLET | Freq: Two times a day (BID) | ORAL | 0 refills | Status: DC | PRN

## 2021-05-20 NOTE — Addendum Note (Signed)
Addended by: Damita Lack on: 05/20/2021 10:56 AM   Modules accepted: Orders

## 2021-05-20 NOTE — Progress Notes (Signed)
Controlled Substance Contract was signed and is already scanned in his chart.

## 2021-05-20 NOTE — Addendum Note (Signed)
Addended by: Kerby Nora E on: 05/20/2021 12:35 PM   Modules accepted: Orders

## 2021-05-20 NOTE — Telephone Encounter (Signed)
Refilled

## 2021-05-24 NOTE — Assessment & Plan Note (Signed)
Chronic, improving control  On trulicity 3 mg weekly and Tresieba 60 units daily, using Aspart Inject 13 units in the morning and 8 units in the evening

## 2021-05-24 NOTE — Assessment & Plan Note (Addendum)
Chronic, worsening control of pain. Trial of narcotic pain medication to treat given no relief from tramadol.  referred to physical Medicine and Rehab for further treatment options.

## 2021-05-28 DIAGNOSIS — E785 Hyperlipidemia, unspecified: Secondary | ICD-10-CM

## 2021-05-28 DIAGNOSIS — E1139 Type 2 diabetes mellitus with other diabetic ophthalmic complication: Secondary | ICD-10-CM

## 2021-05-28 DIAGNOSIS — E1169 Type 2 diabetes mellitus with other specified complication: Secondary | ICD-10-CM | POA: Diagnosis not present

## 2021-05-28 DIAGNOSIS — E1165 Type 2 diabetes mellitus with hyperglycemia: Secondary | ICD-10-CM | POA: Diagnosis not present

## 2021-06-11 ENCOUNTER — Other Ambulatory Visit: Payer: Self-pay | Admitting: Cardiovascular Disease

## 2021-06-17 ENCOUNTER — Other Ambulatory Visit: Payer: Self-pay | Admitting: Family Medicine

## 2021-06-29 ENCOUNTER — Other Ambulatory Visit: Payer: Self-pay | Admitting: Family Medicine

## 2021-07-13 ENCOUNTER — Other Ambulatory Visit: Payer: Self-pay | Admitting: Family Medicine

## 2021-07-13 ENCOUNTER — Other Ambulatory Visit: Payer: HMO

## 2021-07-19 ENCOUNTER — Telehealth: Payer: Self-pay | Admitting: Family Medicine

## 2021-07-19 NOTE — Telephone Encounter (Signed)
Please schedule CPE with fasting labs prior after 11/11/2021.  He already has MWV scheduled with nurse.

## 2021-07-20 ENCOUNTER — Ambulatory Visit: Payer: HMO | Admitting: Family Medicine

## 2021-07-20 NOTE — Telephone Encounter (Signed)
Called patient and left vm to schuedule CPE

## 2021-07-20 NOTE — Telephone Encounter (Signed)
Appointment scheduled 3.21.23 at 9:20AM for CPE

## 2021-07-21 ENCOUNTER — Other Ambulatory Visit: Payer: Self-pay | Admitting: Family Medicine

## 2021-08-03 ENCOUNTER — Telehealth: Payer: Self-pay

## 2021-08-03 NOTE — Telephone Encounter (Signed)
That would be waiting  too long.. > 6 months. I understand he cannot come out here to GO.  Can we do a POC A1C and OV in early January at [facility]?

## 2021-08-03 NOTE — Telephone Encounter (Signed)
[name] notified as instructed by telephone. Patient states understanding and will completed kit today and get it mailed back.  [name] states he is scheduled for labs and to see Dr. Diona Browner in December but learned we will not be back at [facility] until [place].  He states he can't drive out this far and is wanting to know if he can wait to see Dr. Diona Browner in March.  He already has his annual wellness schedule then.  Please advise.

## 2021-08-03 NOTE — Telephone Encounter (Signed)
Patient wants to know if its necessary to complete the stool kit he received in the mail. He was under the impression this is no longer beneficial for him.  Debbora Dus, PharmD Clinical Pharmacist Practitioner [facility] at [facility] [phone]

## 2021-08-04 NOTE — Telephone Encounter (Signed)
Mr. Hevener notified as instructed by telephone.  Appointment rescheduled to 09/14/2021 at 11:20 am with Dr. Ermalene Searing with hopes we will be back at [facility].

## 2021-08-09 ENCOUNTER — Other Ambulatory Visit: Payer: Self-pay | Admitting: Family Medicine

## 2021-08-09 NOTE — Telephone Encounter (Signed)
Last office visit 05/13/2021 for Pain Management.  Last refilled 05/20/2021 for #60 with no refills for 10/22 & 11/22.  UDS/Contract 05/17/2021.  Next Appt: 09/14/2021 for DM.

## 2021-08-12 ENCOUNTER — Other Ambulatory Visit: Payer: HMO

## 2021-08-12 ENCOUNTER — Telehealth: Payer: Self-pay | Admitting: Family Medicine

## 2021-08-12 DIAGNOSIS — E559 Vitamin D deficiency, unspecified: Secondary | ICD-10-CM

## 2021-08-12 DIAGNOSIS — E538 Deficiency of other specified B group vitamins: Secondary | ICD-10-CM

## 2021-08-12 DIAGNOSIS — Z125 Encounter for screening for malignant neoplasm of prostate: Secondary | ICD-10-CM

## 2021-08-12 DIAGNOSIS — Z794 Long term (current) use of insulin: Secondary | ICD-10-CM

## 2021-08-12 NOTE — Telephone Encounter (Signed)
-----   Message from Alvina Chou sent at 07/28/2021 11:00 AM EST ----- Regarding: Lab orders for Thursday. 12.15.22 Lab orders for a 3 month follow up appt.

## 2021-08-17 DIAGNOSIS — H3582 Retinal ischemia: Secondary | ICD-10-CM | POA: Diagnosis not present

## 2021-08-17 DIAGNOSIS — H35372 Puckering of macula, left eye: Secondary | ICD-10-CM | POA: Diagnosis not present

## 2021-08-17 DIAGNOSIS — H3563 Retinal hemorrhage, bilateral: Secondary | ICD-10-CM | POA: Diagnosis not present

## 2021-08-17 DIAGNOSIS — H34813 Central retinal vein occlusion, bilateral, with macular edema: Secondary | ICD-10-CM | POA: Diagnosis not present

## 2021-08-19 ENCOUNTER — Ambulatory Visit: Payer: HMO | Admitting: Family Medicine

## 2021-09-09 ENCOUNTER — Telehealth: Payer: Self-pay | Admitting: Family Medicine

## 2021-09-09 NOTE — Telephone Encounter (Signed)
Last office visit 05/13/2021 for Pain Management.  Last refilled 08/10/2021 for #60 with no refills.  UDS/Contract 05/17/2021.  Next Appt: 11/05/2021 for DM.

## 2021-09-10 NOTE — Telephone Encounter (Signed)
Called Mr. Travanti and got him scheduled for 1/24 @1140 

## 2021-09-10 NOTE — Telephone Encounter (Signed)
[name] called in and stated that he out of his medication and wanted to know if it can be called in.

## 2021-09-10 NOTE — Telephone Encounter (Signed)
Please call and schedule Pain Management Appointment with Dr. Ermalene Searing.  Okay to do virtual.

## 2021-09-13 NOTE — Telephone Encounter (Signed)
Pt has an upcoming appointment on 09/21/21 with Bedsole. He is wondering if this script is going to be filled for him. Please advise pt.

## 2021-09-14 ENCOUNTER — Ambulatory Visit: Payer: HMO | Admitting: Family Medicine

## 2021-09-15 ENCOUNTER — Emergency Department
Admission: EM | Admit: 2021-09-15 | Discharge: 2021-09-15 | Disposition: A | Discharge status: Home / Self Care | Payer: HMO | Attending: Emergency Medicine | Admitting: Emergency Medicine

## 2021-09-15 ENCOUNTER — Emergency Department: Payer: HMO

## 2021-09-15 ENCOUNTER — Other Ambulatory Visit: Payer: Self-pay

## 2021-09-15 ENCOUNTER — Encounter: Payer: Self-pay | Admitting: Medical Oncology

## 2021-09-15 DIAGNOSIS — Z79899 Other long term (current) drug therapy: Secondary | ICD-10-CM | POA: Insufficient documentation

## 2021-09-15 DIAGNOSIS — R0789 Other chest pain: Secondary | ICD-10-CM | POA: Diagnosis not present

## 2021-09-15 DIAGNOSIS — I509 Heart failure, unspecified: Secondary | ICD-10-CM | POA: Insufficient documentation

## 2021-09-15 DIAGNOSIS — R140 Abdominal distension (gaseous): Secondary | ICD-10-CM | POA: Diagnosis not present

## 2021-09-15 DIAGNOSIS — I2510 Atherosclerotic heart disease of native coronary artery without angina pectoris: Secondary | ICD-10-CM | POA: Insufficient documentation

## 2021-09-15 DIAGNOSIS — E119 Type 2 diabetes mellitus without complications: Secondary | ICD-10-CM | POA: Insufficient documentation

## 2021-09-15 DIAGNOSIS — R079 Chest pain, unspecified: Secondary | ICD-10-CM | POA: Diagnosis not present

## 2021-09-15 DIAGNOSIS — I10 Essential (primary) hypertension: Secondary | ICD-10-CM | POA: Diagnosis not present

## 2021-09-15 DIAGNOSIS — I517 Cardiomegaly: Secondary | ICD-10-CM | POA: Diagnosis not present

## 2021-09-15 DIAGNOSIS — R739 Hyperglycemia, unspecified: Secondary | ICD-10-CM | POA: Diagnosis not present

## 2021-09-15 LAB — BASIC METABOLIC PANEL
Anion gap: 5 (ref 5–15)
BUN: 20 mg/dL (ref 8–23)
CO2: 27 mmol/L (ref 22–32)
Calcium: 8.6 mg/dL — ABNORMAL LOW (ref 8.9–10.3)
Chloride: 104 mmol/L (ref 98–111)
Creatinine, Ser: 1.2 mg/dL (ref 0.61–1.24)
GFR, Estimated: >60 mL/min (ref >60)
Glucose, Bld: 300 mg/dL — ABNORMAL HIGH (ref 70–99)
Potassium: 3.7 mmol/L (ref 3.5–5.1)
Sodium: 136 mmol/L (ref 135–145)

## 2021-09-15 LAB — CBC
HCT: 37.7 % — ABNORMAL LOW (ref 39.0–52.0)
Hemoglobin: 12.1 g/dL — ABNORMAL LOW (ref 13.0–17.0)
MCH: 27.3 pg (ref 26.0–34.0)
MCHC: 32.1 g/dL (ref 30.0–36.0)
MCV: 85.1 fL (ref 80.0–100.0)
Platelets: 251 10*3/uL (ref 150–400)
RBC: 4.43 MIL/uL (ref 4.22–5.81)
RDW: 15.3 % (ref 11.5–15.5)
WBC: 6.6 10*3/uL (ref 4.0–10.5)
nRBC: 0 % (ref 0.0–0.2)

## 2021-09-15 LAB — TROPONIN I (HIGH SENSITIVITY)
Troponin I (High Sensitivity): 20 ng/L — ABNORMAL HIGH (ref <18)
Troponin I (High Sensitivity): 28 ng/L — ABNORMAL HIGH (ref <18)

## 2021-09-15 NOTE — ED Triage Notes (Signed)
Pt comes via [facility] with c/o CP that started yesterday. Pt states he thinks he is building up fluid as well. Pt states SOB. Pt states mid sternal CP.

## 2021-09-15 NOTE — Discharge Instructions (Signed)
Return to the emergency department if your chest pain returns or for other symptoms of concern.

## 2021-09-15 NOTE — ED Notes (Signed)
Pt discharge information reviewed. Pt understands need for follow up care and when to return if symptoms worsen. All questions answered. Pt is alert and oriented with even and regular respirations. Pt is seen ambulating out of department with string steady gait.   

## 2021-09-15 NOTE — ED Notes (Signed)
Pt also wishes no information to be showed with daughter who is with pt.

## 2021-09-15 NOTE — ED Notes (Signed)
Pt states to this RN he has been feeling depressed. RN asks pt if he is having SI thoughts and pt states no. Pt states he lost his wife and just gets depressed around this time.

## 2021-09-15 NOTE — ED Triage Notes (Signed)
Pt comes into the ED via [facility] from home c/o chest pain.  Pt states it feels like pressure, h/o CHF and MI.  Abd is distended as well.  Pt denies any SHOB or nausea.  Lung sounds clear for EMS.  Pt took 1 nitro at home and had relief.  324 ASA given.  20g L wrist.  Denies any radiating pain.   12 lead unremarkable 130/100 90 HR 95% RA 427 CBG-- has dexcom

## 2021-09-15 NOTE — ED Provider Notes (Signed)
[facility] Provider Note    Event Date/Time   First MD Initiated Contact with Patient 09/15/21 1839     (approximate)   History   Chest Pain   HPI  [name] is a 71 y.o. male with a history of CAD, CHF, diabetes, and remaining history as listed in chart presents to the emergency department for treatment and evaluation of chest pressure without shortness of breath, nausea, or diaphoresis.  Symptoms started yesterday. He took 1 NTG with relief of pain. He had also felt like he was retaining fluid and took an extra Lasix. He has urinated several times since and believes this has resolved. He also states that his chest pain may have been indigestion.     Physical Exam   Triage Vital Signs: ED Triage Vitals  Enc Vitals Group     BP 09/15/21 1502 (!) 166/81     Pulse Rate 09/15/21 1502 84     Resp 09/15/21 1502 19     Temp 09/15/21 1502 98 F (36.7 C)     Temp Source 09/15/21 1814 Oral     SpO2 09/15/21 1502 94 %     Weight --      Height --      Head Circumference --      Peak Flow --      Pain Score 09/15/21 1501 2     Pain Loc --      Pain Edu? --      Excl. in GC? --     Most recent vital signs: Vitals:   09/15/21 1814 09/15/21 1900  BP: (!) 157/117 (!) 153/70  Pulse: 78 77  Resp: 17 17  Temp: 98.7 F (37.1 C)   SpO2: 96% 98%     General: Awake, no distress.  CV:  Good peripheral perfusion.  Resp:  Normal effort. Breath sounds clear. Abd:  No distention.  Other:     ED Results / Procedures / Treatments   Labs (all labs ordered are listed, but only abnormal results are displayed) Labs Reviewed  BASIC METABOLIC PANEL - Abnormal; Notable for the following components:      Result Value   Glucose, Bld 300 (*)    Calcium 8.6 (*)    All other components within normal limits  CBC - Abnormal; Notable for the following components:   Hemoglobin 12.1 (*)    HCT 37.7 (*)    All other components within normal limits  TROPONIN I  (HIGH SENSITIVITY) - Abnormal; Notable for the following components:   Troponin I (High Sensitivity) 20 (*)    All other components within normal limits  TROPONIN I (HIGH SENSITIVITY) - Abnormal; Notable for the following components:   Troponin I (High Sensitivity) 28 (*)    All other components within normal limits     EKG  ED ECG REPORT I, Aubria Vanecek, FNP-BC personally viewed and interpreted this ECG.   Date: 09/15/2021  EKG Time: 1502  Rate: 82  Rhythm: normal EKG, normal sinus rhythm, unchanged from previous tracings  Axis: normal  Intervals:first-degree A-V block   ST&T Change: no ST change    RADIOLOGY Chest x-ray image and radiology report reviewed by me. No acute cardiopulmonary abnormality.   PROCEDURES:  Critical Care performed: No  Procedures   MEDICATIONS ORDERED IN ED: Medications - No data to display   IMPRESSION / MDM / ASSESSMENT AND PLAN / ED COURSE  I reviewed the triage vital signs and the nursing  notes.                              Differential diagnosis includes, but is not limited to, CAD, NSTEMI, GERD, anxiety, CHF exacerbation  71 year old male presenting to the emergency department for treatment and evaluation after experiencing some chest pain and fluid retention earlier today.  Symptoms started yesterday.  Patient states that he did take 1 nitroglycerin with relief of symptoms and doubled his Lasix.  Since being here in the department, his symptoms have completely resolved and he wishes to go home.  EKG is reassuring.  Serial troponins are at or below patient's baseline.  Chest x-ray is without acute findings.  He was initially hypertensive, however now normal.  Plan will be to discharge him home with encouragement to follow-up with cardiology.  He was also given strict ER return precautions.  Patient states that if he starts having chest pain again that he will call 911 and return.      FINAL CLINICAL IMPRESSION(S) / ED DIAGNOSES    Final diagnoses:  Nonspecific chest pain     Rx / DC Orders   ED Discharge Orders     None        Note:  This document was prepared using Dragon voice recognition software and may include unintentional dictation errors.   Chinita Pester, FNP 09/15/21 2012    Shaune Pollack, MD 09/16/21 Evern [employer]    Shaune Pollack, MD 09/16/21 [phone]

## 2021-09-16 MED ORDER — HYDROCODONE-ACETAMINOPHEN 5-325 MG PO TABS: 1.0000 | ORAL_TABLET | Freq: Two times a day (BID) | ORAL | 0 refills | Status: DC | PRN

## 2021-09-16 NOTE — Telephone Encounter (Signed)
I will send in few days of rx to last until appt.  Needs to get Rx at appt given it is a narcotic.  PDMP reviewed during this encounter.

## 2021-09-16 NOTE — Telephone Encounter (Signed)
Left message for [name] that Dr. Ermalene Searing sent in a few days of rx to last until appt. But reminded him that he needs to get Rx at appt given it is a narcotic.

## 2021-09-16 NOTE — Addendum Note (Signed)
Addended byKerby Nora E on: 09/16/2021 01:54 PM   Modules accepted: Orders

## 2021-09-21 ENCOUNTER — Telehealth (INDEPENDENT_AMBULATORY_CARE_PROVIDER_SITE_OTHER): Payer: HMO | Admitting: Family Medicine

## 2021-09-21 ENCOUNTER — Other Ambulatory Visit: Payer: Self-pay

## 2021-09-21 ENCOUNTER — Encounter: Payer: Self-pay | Admitting: Family Medicine

## 2021-09-21 VITALS — Ht 65.5 in | Wt 278.2 lb

## 2021-09-21 DIAGNOSIS — M545 Low back pain, unspecified: Secondary | ICD-10-CM

## 2021-09-21 DIAGNOSIS — F411 Generalized anxiety disorder: Secondary | ICD-10-CM

## 2021-09-21 DIAGNOSIS — F331 Major depressive disorder, recurrent, moderate: Secondary | ICD-10-CM | POA: Diagnosis not present

## 2021-09-21 DIAGNOSIS — G8929 Other chronic pain: Secondary | ICD-10-CM

## 2021-09-21 MED ORDER — HYDROCODONE-ACETAMINOPHEN 5-325 MG PO TABS: 1.0000 | ORAL_TABLET | Freq: Two times a day (BID) | ORAL | 0 refills | Status: DC | PRN

## 2021-09-21 MED ORDER — HYDROXYZINE HCL 10 MG PO TABS: 10.0000 mg | ORAL_TABLET | Freq: Three times a day (TID) | ORAL | 2 refills | Status: DC | PRN

## 2021-09-21 MED ORDER — HYDROCODONE-ACETAMINOPHEN 5-325 MG PO TABS: 1.0000 | ORAL_TABLET | Freq: Two times a day (BID) | ORAL | 0 refills | Status: AC | PRN

## 2021-09-21 NOTE — Assessment & Plan Note (Signed)
Stable control on current regimen. 

## 2021-09-21 NOTE — Progress Notes (Signed)
VIRTUAL VISIT Due to national recommendations of social distancing due to COVID 19, a virtual visit is felt to be most appropriate for this patient at this time.   I connected with the patient on 09/21/21 at 11:40 AM EST by virtual telehealth platform and verified that I am speaking with the correct person using two identifiers.   I discussed the limitations, risks, security and privacy concerns of performing an evaluation and management service by  virtual telehealth platform and the availability of in person appointments. I also discussed with the patient that there may be a patient responsible charge related to this service. The patient expressed understanding and agreed to proceed.  Patient location: Home Provider Location: [facility] Participants: Kerby Nora and [name]   Chief Complaint  Patient presents with   Pain Management    UDS/Contract 05/17/2021    History of Present Illness:  71 year old male with chronic back pain presents for pain management  Indication for chronic opioid: chronic back pain Medication and dose: hydrocodone 1 tablet   BID as needed # pills per month: 60 Last UDS date: 05/17/2021 Opioid Treatment Agreement signed (Y/N): Y Opioid Treatment Agreement last reviewed with patient:  Y NCCSRS reviewed this encounter (include red flags):   he did not realize taking friends diazepam was going against the contract.. did not know that it was controlled substance. PDMP reviewed during this encounter.   He was also seen in ER on 09/15/2021 for chest pain, had some fluid overload( improved with additional dose of lasix).Marland Kitchen this started after his girlfriend left him. He has been feeling depressed and anxious.  " Feels like my wife died all over again" he has been using a friends medication which has helped.. diazepam 10 mg daily.  He is on venlafaxine 225 mg daily for  MDD     COVID 19 screen No recent travel or known exposure to COVID19 The  patient denies respiratory symptoms of COVID 19 at this time.  The importance of social distancing was discussed today.   Review of Systems  Constitutional:  Negative for chills and fever.  HENT:  Negative for congestion and ear pain.   Eyes:  Negative for pain and redness.  Respiratory:  Negative for cough and shortness of breath.   Cardiovascular:  Negative for chest pain, palpitations and leg swelling.  Gastrointestinal:  Negative for abdominal pain, blood in stool, constipation, diarrhea, nausea and vomiting.  Genitourinary:  Negative for dysuria.  Musculoskeletal:  Negative for falls and myalgias.  Skin:  Negative for rash.  Neurological:  Negative for dizziness.  Psychiatric/Behavioral:  Positive for depression. The patient is nervous/anxious and has insomnia.      Past Medical History:  Diagnosis Date   Back injury 02/2002   worker's comp   CHF (congestive heart failure) (HCC)    Coronary artery disease, non-occlusive    a. cath 2002 with no sig CAD;  b. cath 2008 normal LM, LAD, LCx, p&dRCA 20-30%, PDA 30%; c.11/2015 NSTEMI/PCI: LM nl, LAD 95p (2.5x15 Xience DES), LCX nl, RCA 100p/m w/ L->R collats, EF 55-65% c. NSTEMI (02/2016) with no culprit leision, switched to Brilinta.  d. NSTEMI 03/2016: again, no culprit lesion and switched back to plavix 2/2 SOB with Brilnta.     Depression    Diabetes mellitus type 2, insulin dependent (HCC)    Hyperlipemia    Hypertensive heart disease    Kidney stones    Morbid obesity (HCC)    Osteoarthritis  Snoring     reports that he has never smoked. He has never used smokeless tobacco. He reports that he does not drink alcohol and does not use drugs.   Current Outpatient Medications:    aspirin 81 MG EC tablet, Take 1 tablet (81 mg total) by mouth daily., Disp: , Rfl:    carvedilol (COREG) 6.25 MG tablet, TAKE 1 TABLET BY MOUTH ONCE DAILY, Disp: 90 tablet, Rfl: 3   cholecalciferol (VITAMIN D3) 25 MCG (1000 UNIT) tablet, Take 2,000 Units by  mouth daily., Disp: , Rfl:    clopidogrel (PLAVIX) 75 MG tablet, TAKE 1 TABLET BY MOUTH ONCE DAILY WITH BREAKFAST, Disp: 90 tablet, Rfl: 1   Continuous Blood Gluc Receiver (FREESTYLE LIBRE 14 DAY READER) DEVI, 1 Device by Does not apply route every 14 (fourteen) days., Disp: 1 Device, Rfl: 0   Continuous Blood Gluc Sensor (FREESTYLE LIBRE 14 DAY SENSOR) MISC, USE TO CHECK BLOOD SUGAR AS DIRECTED. CHANGE EVERY 14 DAYS., Disp: 2 each, Rfl: 11   Dulaglutide 4.5 MG/0.5ML SOPN, Inject 4.5 mg into the skin. Inject 4.5 mg once weekly (patient receives from [employer]), Disp: , Rfl:    ezetimibe (ZETIA) 10 MG tablet, TAKE 1 TABLET BY MOUTH ONCE DAILY, Disp: 30 tablet, Rfl: 10   furosemide (LASIX) 40 MG tablet, TAKE 1 TABLET BY MOUTH DAILY CAN TAKE A SECOND DAILY DOSE AS NEEDED, Disp: 180 tablet, Rfl: 1   HYDROcodone-acetaminophen (NORCO/VICODIN) 5-325 MG tablet, Take 1 tablet by mouth 2 (two) times daily as needed for up to 5 days for moderate pain or severe pain., Disp: 10 tablet, Rfl: 0   Insulin Aspart FlexPen 100 UNIT/ML SOPN, Inject 13 units in the morning and 8 units in the evening, Disp: 15 mL, Rfl: 6   Insulin Pen Needle (PEN NEEDLES) 31G X 6 MM MISC, Use to inject insulin 3 times a day, Disp: 300 each, Rfl: 3   isosorbide mononitrate (IMDUR) 30 MG 24 hr tablet, TAKE 1 TABLET BY MOUTH TWICE DAILY, Disp: 180 tablet, Rfl: 1   losartan (COZAAR) 50 MG tablet, TAKE 1 TABLET BY MOUTH ONCE DAILY, Disp: 90 tablet, Rfl: 1   nitroGLYCERIN (NITROSTAT) 0.4 MG SL tablet, DISSOLVE 1 TABLET UNDER TONGUE AS NEEDEDFOR CHEST PAIN. MAY REPEAT 5 MINUTES APART 3 TIMES IF NEEDED, Disp: 25 tablet, Rfl: 3   potassium chloride SA (K-DUR) 20 MEQ tablet, Take 1 tablet (20 mEq total) by mouth daily., Disp: 90 tablet, Rfl: 1   TRESIBA FLEXTOUCH 100 UNIT/ML FlexTouch Pen, INJECT 60 UNITS INTO THE SKIN DAILY, Disp: 15 mL, Rfl: 3   venlafaxine XR (EFFEXOR-XR) 150 MG 24 hr capsule, TAKE 1 CAPSULE BY MOUTH DAILY WITH BREAKFAST. TAKE  WITH EFFEXOR XR 75MG  FORA TOTAL OF 225MG , Disp: 90 capsule, Rfl: 1   venlafaxine XR (EFFEXOR-XR) 75 MG 24 hr capsule, TAKE 1 CAPSULE BY MOUTH ONCE DAILY. TAKEIN ADDITION TO THE 150 MG CAPSULE FOR A TOTAL DOSE OF 225MG  DAILY, Disp: 90 capsule, Rfl: 1   vitamin B-12 (CYANOCOBALAMIN) 1000 MCG tablet, Take 1,000 mcg by mouth daily., Disp: , Rfl:    Observations/Objective: Height 5' 5.5" (1.664 m), weight 278 lb 4 oz (126.2 kg).  Physical Exam  Physical Exam Constitutional:      General: The patient is not in acute distress. Pulmonary:     Effort: Pulmonary effort is normal. No respiratory distress.  Neurological:     Mental Status: The patient is alert and oriented to person, place, and time.  Psychiatric:  Mood and Affect: Mood normal.        Behavior: Behavior normal.   Assessment and Plan Problem List Items Addressed This Visit     Major depressive disorder, recurrent episode, moderate (HCC) - Primary (Chronic)    Offered counseling... refused.  Continue venlafaxine 225 mg daily.  Stop diazepam.  can start hydroxyzine 10 mg three times daily prn anxiety.  If not improving we can consider increase or change of SNRI.      Relevant Medications   hydrOXYzine (ATARAX) 10 MG tablet   Chronic low back pain     Stable control on current regimen.      Relevant Medications   HYDROcodone-acetaminophen (NORCO/VICODIN) 5-325 MG tablet   HYDROcodone-acetaminophen (NORCO) 5-325 MG tablet   HYDROcodone-acetaminophen (NORCO) 5-325 MG tablet   Encounter for chronic pain management    ndication for chronic opioid: chronic back pain Medication and dose: hydrocodone 1 tablet   BID as needed # pills per month: 60 Last UDS date: 05/17/2021 Opioid Treatment Agreement signed (Y/N): Y Opioid Treatment Agreement last reviewed with patient:  Y NCCSRS reviewed this encounter (include red flags):   he did not realize taking friends diazepam was going against the contract.. did not know that it  was controlled substance. PDMP reviewed during this encounter.      GAD (generalized anxiety disorder)   Relevant Medications   hydrOXYzine (ATARAX) 10 MG tablet      I discussed the assessment and treatment plan with the patient. The patient was provided an opportunity to ask questions and all were answered. The patient agreed with the plan and demonstrated an understanding of the instructions.   The patient was advised to call back or seek an in-person evaluation if the symptoms worsen or if the condition fails to improve as anticipated.     Kerby NoraAmy Helene Bernstein, MD

## 2021-09-21 NOTE — Assessment & Plan Note (Signed)
Offered counseling... refused.  Continue venlafaxine 225 mg daily.  Stop diazepam.  can start hydroxyzine 10 mg three times daily prn anxiety.  If not improving we can consider increase or change of SNRI.

## 2021-09-21 NOTE — Assessment & Plan Note (Signed)
ndication for chronic opioid: chronic back pain Medication and dose: hydrocodone 1 tablet   BID as needed # pills per month: 60 Last UDS date: 05/17/2021 Opioid Treatment Agreement signed (Y/N): Y Opioid Treatment Agreement last reviewed with patient:  Y NCCSRS reviewed this encounter (include red flags):   he did not realize taking friends diazepam was going against the contract.. did not know that it was controlled substance. PDMP reviewed during this encounter.

## 2021-09-23 ENCOUNTER — Telehealth: Payer: Self-pay | Admitting: Cardiovascular Disease

## 2021-09-23 ENCOUNTER — Other Ambulatory Visit: Payer: Self-pay | Admitting: Family Medicine

## 2021-09-23 ENCOUNTER — Other Ambulatory Visit: Payer: Self-pay | Admitting: Cardiovascular Disease

## 2021-09-23 NOTE — Telephone Encounter (Signed)
LMOV to schedule hosp fu Seen in ED 09/15/21

## 2021-09-28 ENCOUNTER — Other Ambulatory Visit: Payer: Self-pay | Admitting: Family Medicine

## 2021-10-08 ENCOUNTER — Other Ambulatory Visit: Payer: Self-pay | Admitting: Family Medicine

## 2021-10-14 ENCOUNTER — Other Ambulatory Visit: Payer: Self-pay | Admitting: Family Medicine

## 2021-10-15 ENCOUNTER — Other Ambulatory Visit: Payer: Self-pay | Admitting: Family Medicine

## 2021-10-19 ENCOUNTER — Other Ambulatory Visit: Payer: Self-pay | Admitting: Family Medicine

## 2021-10-19 NOTE — Telephone Encounter (Signed)
Last office visit 09/21/21 for pain management, depression and GAD.  Last refilled 09/21/21 for #60 with no refills.  I also see a refill to fill on 11/19/21 but I do not see one for Oct 21, 2022.  UDS/Contract 05/17/21.  Next Appt: 11/05/21 for DM.

## 2021-10-20 ENCOUNTER — Other Ambulatory Visit: Payer: Self-pay | Admitting: Family Medicine

## 2021-10-20 NOTE — Telephone Encounter (Signed)
Please deny refill.  Sent 10/19/2021.  I am not allowed to deny controlled prescriptions.

## 2021-11-01 LAB — HM DIABETES FOOT EXAM

## 2021-11-05 ENCOUNTER — Ambulatory Visit (INDEPENDENT_AMBULATORY_CARE_PROVIDER_SITE_OTHER): Payer: HMO | Admitting: Family Medicine

## 2021-11-05 ENCOUNTER — Other Ambulatory Visit: Payer: Self-pay

## 2021-11-05 ENCOUNTER — Telehealth: Payer: Self-pay

## 2021-11-05 VITALS — BP 136/74 | HR 89 | Ht 65.5 in | Wt 283.0 lb

## 2021-11-05 DIAGNOSIS — Z794 Long term (current) use of insulin: Secondary | ICD-10-CM

## 2021-11-05 DIAGNOSIS — G8929 Other chronic pain: Secondary | ICD-10-CM

## 2021-11-05 DIAGNOSIS — M545 Low back pain, unspecified: Secondary | ICD-10-CM | POA: Diagnosis not present

## 2021-11-05 DIAGNOSIS — E11319 Type 2 diabetes mellitus with unspecified diabetic retinopathy without macular edema: Secondary | ICD-10-CM

## 2021-11-05 DIAGNOSIS — E113393 Type 2 diabetes mellitus with moderate nonproliferative diabetic retinopathy without macular edema, bilateral: Secondary | ICD-10-CM

## 2021-11-05 LAB — POCT GLYCOSYLATED HEMOGLOBIN (HGB A1C): Hemoglobin A1C: 7.1 % — AB (ref 4.0–5.6)

## 2021-11-05 MED ORDER — HYDROCODONE-ACETAMINOPHEN 5-325 MG PO TABS: 1.0000 | ORAL_TABLET | Freq: Two times a day (BID) | ORAL | 0 refills | Status: DC | PRN

## 2021-11-05 MED ORDER — HYDROCODONE-ACETAMINOPHEN 5-325 MG PO TABS: ORAL_TABLET | ORAL | 0 refills | Status: DC

## 2021-11-05 NOTE — Patient Instructions (Addendum)
Can use Miralax as needed for constipation. ? Work on [employer] and regular exercise.  ? I will contact the pharmacist about the Dulaglutide. ?

## 2021-11-05 NOTE — Progress Notes (Signed)
? ? ?  Chronic Care Management ?Pharmacy Assistant  ? ?Name: [name]  MRN: [id number] DOB: 1950/12/31 ? ?Reason for Encounter: CCM (Trulicity 2023 PAP) ?  ?I talked with [name]; patient's assistance expired at the end of 2022; because patient had previously been receiving assistance for Trulicity he only needs to reapply. Called patient to inform him that him that he will need to reapply for Trulicity patient assistance through [name] for 2023. Patient will need to come in to the office to sign the paperwork. Patient will need to bring proof of income when he comes. Patient understood and agreed. I informed him I would call him as soon as the forms were ready for him to sign. [name] will need a new RX for [employer].  ? ?Al Corpus, CPP notified ? ?Claudina Lick, RMA ?Clinical Pharmacy Assistant ?[phone] ? ? ?

## 2021-11-05 NOTE — Progress Notes (Incomplete)
Patient ID: [name], male    DOB: [birth year], 71 y.o.   MRN: [id number]  This visit was conducted in person.  BP 136/74    Pulse 89    Ht 5' 5.5" (1.664 m)    Wt 283 lb (128.4 kg)    SpO2 93%    BMI 46.38 kg/m    CC: Chief Complaint  Patient presents with   Follow-up    Follow up diabetes     Subjective:   HPI: [name] is a 71 y.o. male presenting on 11/05/2021 for Follow-up (Follow up diabetes )  Diabetes:   Triseba 60 units , dulaglutide 4.5 mg weekly ( has been off in last 3 weeks given not sent)  Lab Results  Component Value Date   HGBA1C 7.1 (A) 11/05/2021  Using medications without difficulties: Hypoglycemic episodes: occ if skip a meal. Hyperglycemic episodes:  occ Feet problems: none Blood Sugars averaging: FBS  70-100 eye exam within last year: Wt Readings from Last 3 Encounters:  11/05/21 283 lb (128.4 kg)  09/21/21 278 lb 4 oz (126.2 kg)  04/16/21 280 lb 4 oz (127.1 kg)       Indication for chronic opioid: chronic back pain Medication and dose: hydrocodone 1 tablet   BID as needed # pills per month: 60 Last UDS date: 05/17/2021 Opioid Treatment Agreement signed (Y/N): Y Opioid Treatment Agreement last reviewed with patient:  Y NCCSRS reviewed this encounter (include red flags):   he did not realize taking friends diazepam was going against the contract.. did not know that it was controlled substance. PDMP reviewed during this encounter.  Relevant past medical, surgical, family and social history reviewed and updated as indicated. Interim medical history since our last visit reviewed. Allergies and medications reviewed and updated. Outpatient Medications Prior to Visit  Medication Sig Dispense Refill   aspirin 81 MG EC tablet Take 1 tablet (81 mg total) by mouth daily.     carvedilol (COREG) 6.25 MG tablet TAKE 1 TABLET BY MOUTH ONCE DAILY 90 tablet 3   cholecalciferol (VITAMIN D3) 25 MCG (1000 UNIT) tablet Take 2,000 Units by mouth  daily.     clopidogrel (PLAVIX) 75 MG tablet TAKE 1 TABLET BY MOUTH ONCE DAILY WITH BREAKFAST 90 tablet 1   Continuous Blood Gluc Receiver (FREESTYLE LIBRE 14 DAY READER) DEVI 1 Device by Does not apply route every 14 (fourteen) days. 1 Device 0   Continuous Blood Gluc Sensor (FREESTYLE LIBRE 14 DAY SENSOR) MISC USE TO CHECK BLOOD SUGAR AS DIRECTED. CHANGE EVERY 14 DAYS. 2 each 0   Dulaglutide 4.5 MG/0.5ML SOPN Inject 4.5 mg into the skin. Inject 4.5 mg once weekly (patient receives from [employer])     ezetimibe (ZETIA) 10 MG tablet TAKE 1 TABLET BY MOUTH ONCE DAILY 30 tablet 10   furosemide (LASIX) 40 MG tablet TAKE 1 TABLET BY MOUTH DAILY CAN TAKE A SECOND DAILY DOSE AS NEEDED 180 tablet 1   HYDROcodone-acetaminophen (NORCO) 5-325 MG tablet Take 1 tablet by mouth 2 (two) times daily as needed for moderate pain. 60 tablet 0   HYDROcodone-acetaminophen (NORCO/VICODIN) 5-325 MG tablet TAKE 1 TABLET BY MOUTH TWICE A DAY AS NEEDED FOR MODERATE PAIN 60 tablet 0   hydrOXYzine (ATARAX) 10 MG tablet Take 1 tablet (10 mg total) by mouth 3 (three) times daily as needed for anxiety. 30 tablet 2   Insulin Aspart FlexPen 100 UNIT/ML SOPN Inject 13 units in the morning and 8 units in the  evening 15 mL 6   Insulin Pen Needle (PEN NEEDLES) 31G X 6 MM MISC Use to inject insulin 3 times a day 300 each 3   isosorbide mononitrate (IMDUR) 30 MG 24 hr tablet TAKE 1 TABLET BY MOUTH TWICE DAILY 180 tablet 1   losartan (COZAAR) 50 MG tablet TAKE 1 TABLET BY MOUTH ONCE DAILY 90 tablet 1   nitroGLYCERIN (NITROSTAT) 0.4 MG SL tablet DISSOLVE 1 TABLET UNDER TONGUE AS NEEDEDFOR CHEST PAIN. MAY REPEAT 5 MINUTES APART 3 TIMES IF NEEDED 25 tablet 3   potassium chloride SA (K-DUR) 20 MEQ tablet Take 1 tablet (20 mEq total) by mouth daily. 90 tablet 1   TRESIBA FLEXTOUCH 100 UNIT/ML FlexTouch Pen INJECT 60 UNITS INTO THE SKIN DAILY 15 mL 3   venlafaxine XR (EFFEXOR-XR) 150 MG 24 hr capsule TAKE 1 CAPSULE BY MOUTH  DAILY WITH BREAKFAST. TAKE WITH EFFEXOR XR 75MG  FORA TOTAL OF 225MG  90 capsule 1   venlafaxine XR (EFFEXOR-XR) 75 MG 24 hr capsule TAKE 1 CAPSULE BY MOUTH ONCE DAILY. TAKEIN ADDITION TO THE 150 MG CAPSULE FOR A TOTAL DOSE OF 225MG  DAILY 90 capsule 1   vitamin B-12 (CYANOCOBALAMIN) 1000 MCG tablet Take 1,000 mcg by mouth daily.     No facility-administered medications prior to visit.     Per HPI unless specifically indicated in ROS section below Review of Systems Objective:  BP 136/74    Pulse 89    Ht 5' 5.5" (1.664 m)    Wt 283 lb (128.4 kg)    SpO2 93%    BMI 46.38 kg/m   Wt Readings from Last 3 Encounters:  11/05/21 283 lb (128.4 kg)  09/21/21 278 lb 4 oz (126.2 kg)  04/16/21 280 lb 4 oz (127.1 kg)      Physical Exam    Results for orders placed or performed in visit on 11/05/21  HgB A1c  Result Value Ref Range   Hemoglobin A1C 7.1 (A) 4.0 - 5.6 %   HbA1c POC (<> result, manual entry)     HbA1c, POC (prediabetic range)     HbA1c, POC (controlled diabetic range)      This visit occurred during the SARS-CoV-2 public health emergency.  Safety protocols were in place, including screening questions prior to the visit, additional usage of staff PPE, and extensive cleaning of exam room while observing appropriate contact time as indicated for disinfecting solutions.   COVID 19 screen:  No recent travel or known exposure to COVID19 The patient denies respiratory symptoms of COVID 19 at this time. The importance of social distancing was discussed today.   Assessment and Plan     09/23/21, MD

## 2021-11-10 NOTE — Progress Notes (Signed)
? ?Subjective:  ? [name] is a 71 y.o. male who presents for Medicare Annual/Subsequent preventive examination. ? ?I connected with [name] today by telephone and verified that I am speaking with the correct person using two identifiers. ?Location patient: home ?Location provider: work ?Persons participating in the virtual visit: patient, nurse.  ?  ?I discussed the limitations, risks, security and privacy concerns of performing an evaluation and management service by telephone and the availability of in person appointments. I also discussed with the patient that there may be a patient responsible charge related to this service. The patient expressed understanding and verbally consented to this telephonic visit.  ?  ?Interactive audio and video telecommunications were attempted between this provider and patient, however failed, due to patient having technical difficulties OR patient did not have access to video capability.  We continued and completed visit with audio only. ? ?Some vital signs may be absent or patient reported.  ? ?Time Spent with patient on telephone encounter: 20 minutes ? ?Review of Systems    ? ?Cardiac Risk Factors include: advanced age (>6955men, 65>65 women);diabetes mellitus;hypertension;dyslipidemia ? ?   ?Objective:  ?  ?Today's Vitals  ? 11/11/21 1356  ?Weight: 283 lb (128.4 kg)  ?Height: 5\' 5"  (1.651 m)  ? ?Body mass index is 47.09 kg/m?. ? ?Advanced Directives 11/11/2021 11/10/2020 05/06/2020 04/01/2020 03/31/2020 01/30/2020 02/18/2019  ?Does Patient Have a Medical Advance Directive? No No No No No No No  ?Would patient like information on creating a medical advance directive? Yes (MAU/Ambulatory/Procedural Areas - Information given) No - Patient declined Yes (MAU/Ambulatory/Procedural Areas - Information given) No - Patient declined No - Patient declined Yes (MAU/Ambulatory/Procedural Areas - Information given) No - Patient declined  ? ? ?Current Medications (verified) ?Outpatient Encounter  Medications as of 11/11/2021  ?Medication Sig  ? aspirin 81 MG EC tablet Take 1 tablet (81 mg total) by mouth daily.  ? carvedilol (COREG) 6.25 MG tablet TAKE 1 TABLET BY MOUTH ONCE DAILY  ? cholecalciferol (VITAMIN D3) 25 MCG (1000 UNIT) tablet Take 2,000 Units by mouth daily.  ? clopidogrel (PLAVIX) 75 MG tablet TAKE 1 TABLET BY MOUTH ONCE DAILY WITH BREAKFAST  ? Continuous Blood Gluc Receiver (FREESTYLE LIBRE 14 DAY READER) DEVI 1 Device by Does not apply route every 14 (fourteen) days.  ? Continuous Blood Gluc Sensor (FREESTYLE LIBRE 14 DAY SENSOR) MISC USE TO CHECK BLOOD SUGAR AS DIRECTED. CHANGE EVERY 14 DAYS.  ? Dulaglutide 4.5 MG/0.5ML SOPN Inject 4.5 mg into the skin. Inject 4.5 mg once weekly (patient receives from [facility])  ? ezetimibe (ZETIA) 10 MG tablet TAKE 1 TABLET BY MOUTH ONCE DAILY  ? furosemide (LASIX) 40 MG tablet TAKE 1 TABLET BY MOUTH DAILY CAN TAKE A SECOND DAILY DOSE AS NEEDED  ? HYDROcodone-acetaminophen (NORCO) 5-325 MG tablet Take 1 tablet by mouth 2 (two) times daily as needed for moderate pain.  ? HYDROcodone-acetaminophen (NORCO/VICODIN) 5-325 MG tablet TAKE 1 TABLET BY MOUTH TWICE A DAY AS NEEDED FOR MODERATE PAIN  ? hydrOXYzine (ATARAX) 10 MG tablet Take 1 tablet (10 mg total) by mouth 3 (three) times daily as needed for anxiety.  ? Insulin Aspart FlexPen 100 UNIT/ML SOPN Inject 13 units in the morning and 8 units in the evening  ? Insulin Pen Needle (PEN NEEDLES) 31G X 6 MM MISC Use to inject insulin 3 times a day  ? isosorbide mononitrate (IMDUR) 30 MG 24 hr tablet TAKE 1 TABLET BY MOUTH TWICE DAILY  ? losartan (  COZAAR) 50 MG tablet TAKE 1 TABLET BY MOUTH ONCE DAILY  ? nitroGLYCERIN (NITROSTAT) 0.4 MG SL tablet DISSOLVE 1 TABLET UNDER TONGUE AS NEEDEDFOR CHEST PAIN. MAY REPEAT 5 MINUTES APART 3 TIMES IF NEEDED  ? potassium chloride SA (K-DUR) 20 MEQ tablet Take 1 tablet (20 mEq total) by mouth daily.  ? TRESIBA FLEXTOUCH 100 UNIT/ML FlexTouch Pen INJECT 60 UNITS INTO THE SKIN  DAILY  ? venlafaxine XR (EFFEXOR-XR) 150 MG 24 hr capsule TAKE 1 CAPSULE BY MOUTH DAILY WITH BREAKFAST. TAKE WITH EFFEXOR XR 75MG  FORA TOTAL OF 225MG   ? venlafaxine XR (EFFEXOR-XR) 75 MG 24 hr capsule TAKE 1 CAPSULE BY MOUTH ONCE DAILY. TAKEIN ADDITION TO THE 150 MG CAPSULE FOR A TOTAL DOSE OF 225MG  DAILY  ? vitamin B-12 (CYANOCOBALAMIN) 1000 MCG tablet Take 1,000 mcg by mouth daily.  ? ?No facility-administered encounter medications on file as of 11/11/2021.  ? ? ?Allergies (verified) ?Atorvastatin  ? ?History: ?Past Medical History:  ?Diagnosis Date  ? Back injury 02/2002  ? worker's comp  ? CHF (congestive heart failure) (HCC)   ? Coronary artery disease, non-occlusive   ? a. cath 2002 with no sig CAD;  b. cath 2008 normal LM, LAD, LCx, p&dRCA 20-30%, PDA 30%; c.11/2015 NSTEMI/PCI: LM nl, LAD 95p (2.5x15 Xience DES), LCX nl, RCA 100p/m w/ L->R collats, EF 55-65% c. NSTEMI (02/2016) with no culprit leision, switched to Brilinta.  d. NSTEMI 03/2016: again, no culprit lesion and switched back to plavix 2/2 SOB with Brilnta.    ? Depression   ? Diabetes mellitus type 2, insulin dependent (HCC)   ? Hyperlipemia   ? Hypertensive heart disease   ? Kidney stones   ? Morbid obesity (HCC)   ? Osteoarthritis   ? Snoring   ? ?Past Surgical History:  ?Procedure Laterality Date  ? CARDIAC CATHETERIZATION  09/2000  ? diffuse LAD 30% LCA  EF 50-60%  ? CARDIAC CATHETERIZATION  06/2007  ? no significant CAD  ? CARDIAC CATHETERIZATION N/A 12/25/2015  ? Procedure: Left Heart Cath and Coronary Angiography;  Surgeon: 10/2000, MD;  Location: [facility] INVASIVE CV LAB;  Service: Cardiovascular;  Laterality: N/A;  ? CARDIAC CATHETERIZATION N/A 12/25/2015  ? Procedure: Coronary Stent Intervention;  Surgeon: 12/27/2015, MD;  Location: [facility] INVASIVE CV LAB;  Service: Cardiovascular;  Laterality: N/A;  ? CARDIAC CATHETERIZATION N/A 03/10/2016  ? Procedure: Left Heart Cath and Coronary Angiography;  Surgeon: 12/27/2015, MD;  Location:  [facility] INVASIVE CV LAB;  Service: Cardiovascular;  Laterality: N/A;  ? CARDIAC CATHETERIZATION N/A 04/06/2016  ? Procedure: Left Heart Cath and Coronary Angiography;  Surgeon: 03/12/2016, MD;  Location: [facility] INVASIVE CV LAB;  Service: Cardiovascular;  Laterality: N/A;  ? CIRCUMCISION    ? ?Family History  ?Problem Relation Age of Onset  ? Alzheimer's disease Mother   ? Emphysema Mother   ? Diabetes Father   ? Heart disease Father   ?     MI  ? Cancer Brother   ?     ? Neck cancer  ? ?Social History  ? ?Socioeconomic History  ? Marital status: Widowed  ?  Spouse name: Not on file  ? Number of children: Not on file  ? Years of education: Not on file  ? Highest education level: Not on file  ?Occupational History  ? Occupation: disabled  ?  Employer: UNEMPLOYED  ?  Comment: back injury  ?Tobacco Use  ? Smoking status: Never  ?  Smokeless tobacco: Never  ?Vaping Use  ? Vaping Use: Never used  ?Substance and Sexual Activity  ? Alcohol use: No  ?  Alcohol/week: 0.0 standard drinks  ? Drug use: No  ? Sexual activity: Not Currently  ?Other Topics Concern  ? Not on file  ?Social History Narrative  ? Financial concerns, wife with depression.  ? Minimal exercise.  ? Diet: poor.  ? ?Social Determinants of Health  ? ?Financial Resource Strain: Low Risk   ? Difficulty of Paying Living Expenses: Not very hard  ?Food Insecurity: No Food Insecurity  ? Worried About Programme researcher, broadcasting/film/video in the Last Year: Never true  ? Ran Out of Food in the Last Year: Never true  ?Transportation Needs: No Transportation Needs  ? Lack of Transportation (Medical): No  ? Lack of Transportation (Non-Medical): No  ?Physical Activity: Inactive  ? Days of Exercise per Week: 0 days  ? Minutes of Exercise per Session: 0 min  ?Stress: No Stress Concern Present  ? Feeling of Stress : Only a little  ?Social Connections: Moderately Isolated  ? Frequency of Communication with Friends and Family: More than three times a week  ? Frequency of Social Gatherings with Friends  and Family: More than three times a week  ? Attends Religious Services: More than 4 times per year  ? Active Member of Clubs or Organizations: No  ? Attends Banker Meetings: Never  ? Larey Brick

## 2021-11-11 ENCOUNTER — Ambulatory Visit (INDEPENDENT_AMBULATORY_CARE_PROVIDER_SITE_OTHER): Payer: HMO

## 2021-11-11 VITALS — Ht 65.0 in | Wt 283.0 lb

## 2021-11-11 DIAGNOSIS — Z0000 Encounter for general adult medical examination without abnormal findings: Secondary | ICD-10-CM | POA: Diagnosis not present

## 2021-11-11 NOTE — Patient Instructions (Signed)
[name] , ?Thank you for taking time to complete your Medicare Wellness Visit. I appreciate your ongoing commitment to your health goals. Please review the following plan we discussed and let me know if I can assist you in the future.  ? ?Screening recommendations/referrals: ?Colonoscopy: due 09/03/21, discuss with PCP if you change your mind  ?ophthalmology/optometry visit : up to date ?Recommended yearly dental visit for hygiene and checkup ? ?Vaccinations: ?Influenza vaccine: up to date, please provide vaccine information when available  ?Pneumococcal vaccine: up to date  ?Tdap vaccine: due 06/29/17, discuss with PCP at your next appointment  ?Shingles vaccine: Discuss with pharmacy   ?Covid-19: newest booster available at your local pharmacy  ? ?Advanced directives: information available at your next visit ? ?Conditions/risks identified: see problem list  ? ?Next appointment: Follow up in one year for your annual wellness visit.  ? ?Preventive Care 72 Years and Older, Male ?Preventive care refers to lifestyle choices and visits with your health care provider that can promote health and wellness. ?What does preventive care include? ?A yearly physical exam. This is also called an annual well check. ?Dental exams once or twice a year. ?Routine eye exams. Ask your health care provider how often you should have your eyes checked. ?Personal lifestyle choices, including: ?Daily care of your teeth and gums. ?Regular physical activity. ?Eating a healthy diet. ?Avoiding tobacco and drug use. ?Limiting alcohol use. ?Practicing safe sex. ?Taking low doses of aspirin every day. ?Taking vitamin and mineral supplements as recommended by your health care provider. ?What happens during an annual well check? ?The services and screenings done by your health care provider during your annual well check will depend on your age, overall health, lifestyle risk factors, and family history of disease. ?Counseling  ?Your health care  provider may ask you questions about your: ?Alcohol use. ?Tobacco use. ?Drug use. ?Emotional well-being. ?Home and relationship well-being. ?Sexual activity. ?Eating habits. ?History of falls. ?Memory and ability to understand (cognition). ?Work and work Astronomer. ?Screening  ?You may have the following tests or measurements: ?Height, weight, and BMI. ?Blood pressure. ?Lipid and cholesterol levels. These may be checked every 5 years, or more frequently if you are over 5 years old. ?Skin check. ?Lung cancer screening. You may have this screening every year starting at age 72 if you have a 30-pack-year history of smoking and currently smoke or have quit within the past 15 years. ?Fecal occult blood test (FOBT) of the stool. You may have this test every year starting at age 72. ?Flexible sigmoidoscopy or colonoscopy. You may have a sigmoidoscopy every 5 years or a colonoscopy every 10 years starting at age 72. ?Prostate cancer screening. Recommendations will vary depending on your family history and other risks. ?Hepatitis C blood test. ?Hepatitis B blood test. ?Sexually transmitted disease (STD) testing. ?Diabetes screening. This is done by checking your blood sugar (glucose) after you have not eaten for a while (fasting). You may have this done every 1-3 years. ?Abdominal aortic aneurysm (AAA) screening. You may need this if you are a current or former smoker. ?Osteoporosis. You may be screened starting at age 72 if you are at high risk. ?Talk with your health care provider about your test results, treatment options, and if necessary, the need for more tests. ?Vaccines  ?Your health care provider may recommend certain vaccines, such as: ?Influenza vaccine. This is recommended every year. ?Tetanus, diphtheria, and acellular pertussis (Tdap, Td) vaccine. You may need a Td booster every 10 years. ?  Zoster vaccine. You may need this after age 72. ?Pneumococcal 13-valent conjugate (PCV13) vaccine. One dose is  recommended after age 16. ?Pneumococcal polysaccharide (PPSV23) vaccine. One dose is recommended after age 72. ?Talk to your health care provider about which screenings and vaccines you need and how often you need them. ?This information is not intended to replace advice given to you by your health care provider. Make sure you discuss any questions you have with your health care provider. ?Document Released: 09/11/2015 Document Revised: 05/04/2016 Document Reviewed: 06/16/2015 ?Elsevier Interactive Patient Education ? 2017 [place]. ? ?Fall Prevention in the Home ?Falls can cause injuries. They can happen to people of all ages. There are many things you can do to make your home safe and to help prevent falls. ?What can I do on the outside of my home? ?Regularly fix the edges of walkways and driveways and fix any cracks. ?Remove anything that might make you trip as you walk through a door, such as a raised step or threshold. ?Trim any bushes or trees on the path to your home. ?Use bright outdoor lighting. ?Clear any walking paths of anything that might make someone trip, such as rocks or tools. ?Regularly check to see if handrails are loose or broken. Make sure that both sides of any steps have handrails. ?Any raised decks and porches should have guardrails on the edges. ?Have any leaves, snow, or ice cleared regularly. ?Use sand or salt on walking paths during winter. ?Clean up any spills in your garage right away. This includes oil or grease spills. ?What can I do in the bathroom? ?Use night lights. ?Install grab bars by the toilet and in the tub and shower. Do not use towel bars as grab bars. ?Use non-skid mats or decals in the tub or shower. ?If you need to sit down in the shower, use a plastic, non-slip stool. ?Keep the floor dry. Clean up any water that spills on the floor as soon as it happens. ?Remove soap buildup in the tub or shower regularly. ?Attach bath mats securely with double-sided non-slip rug  tape. ?Do not have throw rugs and other things on the floor that can make you trip. ?What can I do in the bedroom? ?Use night lights. ?Make sure that you have a light by your bed that is easy to reach. ?Do not use any sheets or blankets that are too big for your bed. They should not hang down onto the floor. ?Have a firm chair that has side arms. You can use this for support while you get dressed. ?Do not have throw rugs and other things on the floor that can make you trip. ?What can I do in the kitchen? ?Clean up any spills right away. ?Avoid walking on wet floors. ?Keep items that you use a lot in easy-to-reach places. ?If you need to reach something above you, use a strong step stool that has a grab bar. ?Keep electrical cords out of the way. ?Do not use floor polish or wax that makes floors slippery. If you must use wax, use non-skid floor wax. ?Do not have throw rugs and other things on the floor that can make you trip. ?What can I do with my stairs? ?Do not leave any items on the stairs. ?Make sure that there are handrails on both sides of the stairs and use them. Fix handrails that are broken or loose. Make sure that handrails are as long as the stairways. ?Check any carpeting to make sure that  it is firmly attached to the stairs. Fix any carpet that is loose or worn. ?Avoid having throw rugs at the top or bottom of the stairs. If you do have throw rugs, attach them to the floor with carpet tape. ?Make sure that you have a light switch at the top of the stairs and the bottom of the stairs. If you do not have them, ask someone to add them for you. ?What else can I do to help prevent falls? ?Wear shoes that: ?Do not have high heels. ?Have rubber bottoms. ?Are comfortable and fit you well. ?Are closed at the toe. Do not wear sandals. ?If you use a stepladder: ?Make sure that it is fully opened. Do not climb a closed stepladder. ?Make sure that both sides of the stepladder are locked into place. ?Ask someone to  hold it for you, if possible. ?Clearly mark and make sure that you can see: ?Any grab bars or handrails. ?First and last steps. ?Where the edge of each step is. ?Use tools that help you move around (mobility aids) if

## 2021-11-15 NOTE — Progress Notes (Signed)
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  ? ? ?Al Corpus, CPP notified ? ?Claudina Lick, RMA ?Clinical Pharmacy Assistant ?[phone] ? ? ? ?

## 2021-11-16 ENCOUNTER — Ambulatory Visit (INDEPENDENT_AMBULATORY_CARE_PROVIDER_SITE_OTHER): Payer: HMO | Admitting: Family Medicine

## 2021-11-16 ENCOUNTER — Other Ambulatory Visit: Payer: Self-pay | Admitting: Family Medicine

## 2021-11-16 ENCOUNTER — Other Ambulatory Visit: Payer: Self-pay

## 2021-11-16 VITALS — BP 136/66 | HR 81 | Temp 98.2°F | Resp 14 | Ht 67.0 in | Wt 283.3 lb

## 2021-11-16 DIAGNOSIS — Z0000 Encounter for general adult medical examination without abnormal findings: Secondary | ICD-10-CM

## 2021-11-16 DIAGNOSIS — E11319 Type 2 diabetes mellitus with unspecified diabetic retinopathy without macular edema: Secondary | ICD-10-CM

## 2021-11-16 DIAGNOSIS — Z794 Long term (current) use of insulin: Secondary | ICD-10-CM | POA: Diagnosis not present

## 2021-11-16 DIAGNOSIS — E113393 Type 2 diabetes mellitus with moderate nonproliferative diabetic retinopathy without macular edema, bilateral: Secondary | ICD-10-CM | POA: Diagnosis not present

## 2021-11-16 DIAGNOSIS — F411 Generalized anxiety disorder: Secondary | ICD-10-CM

## 2021-11-16 DIAGNOSIS — F331 Major depressive disorder, recurrent, moderate: Secondary | ICD-10-CM | POA: Diagnosis not present

## 2021-11-16 DIAGNOSIS — I25118 Atherosclerotic heart disease of native coronary artery with other forms of angina pectoris: Secondary | ICD-10-CM | POA: Diagnosis not present

## 2021-11-16 MED ORDER — BUPROPION HCL ER (XL) 150 MG PO TB24: 150.0000 mg | ORAL_TABLET | Freq: Every day | ORAL | 3 refills | Status: DC

## 2021-11-16 NOTE — Assessment & Plan Note (Signed)
Associated with diabetes.  Poor control, leading to blindness.  Followed closely by ophthalmology ?

## 2021-11-16 NOTE — Assessment & Plan Note (Signed)
Followed closely by cardiology/  

## 2021-11-16 NOTE — Telephone Encounter (Signed)
Refill request hydrocodone 5-325 LR 11/05/21 #60 ?Last office visit today 11/16/20 ?Last UDS 919/22 ?Last contract 05/17/21 ?

## 2021-11-16 NOTE — Assessment & Plan Note (Signed)
Chronic, poor control ? ?Will add Wellbutrin 150 mg XL to venlafaxine to 25 mg daily.  He did try Wellbutrin in the past but tried this alone.  I am hoping that the Wellbutrin will increase his energy and his motivation without side effects.  He will follow-up in 4 weeks for reevaluation including PHQ-9 and GAD-7. ?

## 2021-11-16 NOTE — Patient Instructions (Addendum)
Continue venlafaxine.... change that to take it at bedtime. ? Add Wellbutrin XL in mornings. ? Get COVID vaccine  bivalent booster. ? Get shingrix at pharmacy. ? ?

## 2021-11-16 NOTE — Progress Notes (Signed)
? ? Patient ID: [name], male    DOB: 1951-04-23, 71 y.o.   MRN: [id number] ? ?This visit was conducted in person. ? ?BP 136/66 (BP Location: Left Arm, Patient Position: Sitting, Cuff Size: Large)   Pulse 81   Temp 98.2 ?F (36.8 ?C) (Oral)   Resp 14   Ht 5\' 7"  (1.702 m)   Wt 283 lb 4.8 oz (128.5 kg)   SpO2 98%   BMI 44.37 kg/m?   ? ?CC: ?Chief Complaint  ?Patient presents with  ? Annual Exam  ?  Completed medicare wellness. Discuss about having no energy not in the mood on doing anything. Tired all the time.  ? ? ?Subjective:  ? ?HPI: ?[name] is a 71 y.o. male presenting on 11/16/2021 for Annual Exam (Completed medicare wellness. Discuss about having no energy not in the mood on doing anything. Tired all the time.) ? ?The patient presents for  complete physical and review of chronic health problems. He/She also has the following acute concerns today: ? ?The patient saw a LPN or RN for medicare wellness visit. ? Prevention and wellness was reviewed in detail. ?Note reviewed and important notes copied below. ? ?Screening recommendations/referrals: ?Colonoscopy: due 09/03/21, discuss with PCP if you change your mind  ?ophthalmology/optometry visit : up to date ?Recommended yearly dental visit for hygiene and checkup ?  ?Vaccinations: ?Influenza vaccine: up to date, please provide vaccine information when available  ?Pneumococcal vaccine: up to date  ?Tdap vaccine: due 06/29/17, discuss with PCP at your next appointment  ?Shingles vaccine: Discuss with pharmacy   ?Covid-19: newest booster available at your local pharmacy  ? ?11/16/21 ? Recent review of Chronic health problems  and lab evaluation at 3/10 OV ?  ? GAD,moderate MDD:  Inadequate control on venlafaxine 225 mg daily ? He is getting frustrated given vision worsening . 'I am going blind" ? He is very tearful out of the blue. ? Worsening in last  several weeks. ?Summer usually helps him... has been worse over the last few months. ? He states he has  no energy. ? ?GAD 7 : Generalized Anxiety Score 11/16/2021 11/05/2021  ?Nervous, Anxious, on Edge 2 3  ?Control/stop worrying 2 3  ?Worry too much - different things 3 0  ?Trouble relaxing 3 3  ?Restless 1 3  ?Easily annoyed or irritable 2 0  ?Afraid - awful might happen 1 0  ?Total GAD 7 Score 14 12  ?Anxiety Difficulty Extremely difficult -  ? ? ? ?Depression screen [facility] 2/9 11/16/2021 11/11/2021 11/05/2021  ?Decreased Interest 2 0 0  ?Down, Depressed, Hopeless 3 0 2  ?PHQ - 2 Score 5 0 2  ?Altered sleeping 1 - 3  ?Tired, decreased energy 3 - 3  ?Change in appetite 0 - 0  ?Feeling bad or failure about yourself  3 - 2  ?Trouble concentrating 3 - 3  ?Moving slowly or fidgety/restless 2 - 0  ?Suicidal thoughts 0 - 0  ?PHQ-9 Score 17 - 13  ?Difficult doing work/chores Extremely dIfficult - -  ?Some recent data might be hidden  ? ? ?  ? ?Relevant past medical, surgical, family and social history reviewed and updated as indicated. Interim medical history since our last visit reviewed. ?Allergies and medications reviewed and updated. ?Outpatient Medications Prior to Visit  ?Medication Sig Dispense Refill  ? aspirin 81 MG EC tablet Take 1 tablet (81 mg total) by mouth daily.    ? carvedilol (COREG) 6.25 MG tablet  TAKE 1 TABLET BY MOUTH ONCE DAILY 90 tablet 3  ? cholecalciferol (VITAMIN D3) 25 MCG (1000 UNIT) tablet Take 2,000 Units by mouth daily.    ? clopidogrel (PLAVIX) 75 MG tablet TAKE 1 TABLET BY MOUTH ONCE DAILY WITH BREAKFAST 90 tablet 1  ? Continuous Blood Gluc Receiver (FREESTYLE LIBRE 14 DAY READER) DEVI 1 Device by Does not apply route every 14 (fourteen) days. 1 Device 0  ? Continuous Blood Gluc Sensor (FREESTYLE LIBRE 14 DAY SENSOR) MISC USE TO CHECK BLOOD SUGAR AS DIRECTED. CHANGE EVERY 14 DAYS. 2 each 0  ? Dulaglutide 4.5 MG/0.5ML SOPN Inject 4.5 mg into the skin. Inject 4.5 mg once weekly (patient receives from [place] [facility])    ? ezetimibe (ZETIA) 10 MG tablet TAKE 1 TABLET BY MOUTH ONCE DAILY 30 tablet 10  ?  furosemide (LASIX) 40 MG tablet TAKE 1 TABLET BY MOUTH DAILY CAN TAKE A SECOND DAILY DOSE AS NEEDED 180 tablet 1  ? HYDROcodone-acetaminophen (NORCO) 5-325 MG tablet Take 1 tablet by mouth 2 (two) times daily as needed for moderate pain. 60 tablet 0  ? HYDROcodone-acetaminophen (NORCO/VICODIN) 5-325 MG tablet TAKE 1 TABLET BY MOUTH TWICE A DAY AS NEEDED FOR MODERATE PAIN 60 tablet 0  ? hydrOXYzine (ATARAX) 10 MG tablet Take 1 tablet (10 mg total) by mouth 3 (three) times daily as needed for anxiety. 30 tablet 2  ? Insulin Aspart FlexPen 100 UNIT/ML SOPN Inject 13 units in the morning and 8 units in the evening 15 mL 6  ? Insulin Pen Needle (PEN NEEDLES) 31G X 6 MM MISC Use to inject insulin 3 times a day 300 each 3  ? isosorbide mononitrate (IMDUR) 30 MG 24 hr tablet TAKE 1 TABLET BY MOUTH TWICE DAILY 180 tablet 1  ? losartan (COZAAR) 50 MG tablet TAKE 1 TABLET BY MOUTH ONCE DAILY 90 tablet 1  ? nitroGLYCERIN (NITROSTAT) 0.4 MG SL tablet DISSOLVE 1 TABLET UNDER TONGUE AS NEEDEDFOR CHEST PAIN. MAY REPEAT 5 MINUTES APART 3 TIMES IF NEEDED 25 tablet 3  ? potassium chloride SA (K-DUR) 20 MEQ tablet Take 1 tablet (20 mEq total) by mouth daily. 90 tablet 1  ? venlafaxine XR (EFFEXOR-XR) 150 MG 24 hr capsule TAKE 1 CAPSULE BY MOUTH DAILY WITH BREAKFAST. TAKE WITH EFFEXOR XR 75MG  FORA TOTAL OF 225MG  90 capsule 1  ? venlafaxine XR (EFFEXOR-XR) 75 MG 24 hr capsule TAKE 1 CAPSULE BY MOUTH ONCE DAILY. TAKEIN ADDITION TO THE 150 MG CAPSULE FOR A TOTAL DOSE OF 225MG  DAILY 90 capsule 1  ? vitamin B-12 (CYANOCOBALAMIN) 1000 MCG tablet Take 1,000 mcg by mouth daily.    ? TRESIBA FLEXTOUCH 100 UNIT/ML FlexTouch Pen INJECT 60 UNITS INTO THE SKIN DAILY (Patient not taking: Reported on 11/16/2021) 15 mL 3  ? ?No facility-administered medications prior to visit.  ?  ? ?Per HPI unless specifically indicated in ROS section below ?Review of Systems  ?Constitutional:  Negative for fatigue and fever.  ?HENT:  Negative for ear pain.   ?Eyes:   Negative for pain.  ?Respiratory:  Negative for cough and shortness of breath.   ?Cardiovascular:  Negative for chest pain, palpitations and leg swelling.  ?Gastrointestinal:  Negative for abdominal pain.  ?Genitourinary:  Negative for dysuria.  ?Musculoskeletal:  Negative for arthralgias.  ?Neurological:  Negative for syncope, light-headedness and headaches.  ?Psychiatric/Behavioral:  Negative for dysphoric mood.   ?Objective:  ?BP 136/66 (BP Location: Left Arm, Patient Position: Sitting, Cuff Size: Large)   Pulse 81  Temp 98.2 ?F (36.8 ?C) (Oral)   Resp 14   Ht 5\' 7"  (1.702 m)   Wt 283 lb 4.8 oz (128.5 kg)   SpO2 98%   BMI 44.37 kg/m?   ?Wt Readings from Last 3 Encounters:  ?11/16/21 283 lb 4.8 oz (128.5 kg)  ?11/11/21 283 lb (128.4 kg)  ?11/05/21 283 lb (128.4 kg)  ?  ?  ?Physical Exam ?Constitutional:   ?   Appearance: He is well-developed. He is obese.  ?HENT:  ?   Head: Normocephalic.  ?   Right Ear: Hearing normal.  ?   Left Ear: Hearing normal.  ?   Nose: Nose normal.  ?Neck:  ?   Thyroid: No thyroid mass or thyromegaly.  ?   Vascular: No carotid bruit.  ?   Trachea: Trachea normal.  ?Cardiovascular:  ?   Rate and Rhythm: Normal rate and regular rhythm.  ?   Pulses: Normal pulses.  ?   Heart sounds: Heart sounds not distant. No murmur heard. ?  No friction rub. No gallop.  ?   Comments: No peripheral edema ?Pulmonary:  ?   Effort: Pulmonary effort is normal. No respiratory distress.  ?   Breath sounds: Normal breath sounds.  ?Skin: ?   General: Skin is warm and dry.  ?   Findings: No rash.  ?Psychiatric:     ?   Speech: Speech normal.     ?   Behavior: Behavior normal.     ?   Thought Content: Thought content normal.  ? ? ?Diabetic foot exam: ?Normal inspection ?No skin breakdown ?No calluses  ?Normal DP pulses ?Normal sensation to light touch and monofilament ?Nails normal ? ?   ?Results for orders placed or performed in visit on 11/05/21  ?HgB A1c  ?Result Value Ref Range  ? Hemoglobin A1C 7.1 (A)  4.0 - 5.6 %  ? HbA1c POC (<> result, manual entry)    ? HbA1c, POC (prediabetic range)    ? HbA1c, POC (controlled diabetic range)    ? ? ?This visit occurred during the SARS-CoV-2 public health emergency.  Safety prot

## 2021-11-16 NOTE — Assessment & Plan Note (Signed)
Counseled on health issues associated with morbid obesity.  Counseled in detail on lifestyle changes including diet and exercise.  He currently has low motivation due to poor control of the mood.  Hopefully with improvement of his mood he will be able to get back to increased activity and weight loss. ?

## 2021-11-16 NOTE — Assessment & Plan Note (Signed)
Chronic, well controlled ? ?He has spoken with our pharmacist and has signed paperwork to continue getting the Trulicity that is worked well for him.  He states he is now having no issues obtaining his medication. ?

## 2021-11-23 DIAGNOSIS — E113393 Type 2 diabetes mellitus with moderate nonproliferative diabetic retinopathy without macular edema, bilateral: Secondary | ICD-10-CM | POA: Diagnosis not present

## 2021-11-23 DIAGNOSIS — H3563 Retinal hemorrhage, bilateral: Secondary | ICD-10-CM | POA: Diagnosis not present

## 2021-11-23 DIAGNOSIS — H34812 Central retinal vein occlusion, left eye, with macular edema: Secondary | ICD-10-CM | POA: Diagnosis not present

## 2021-11-23 DIAGNOSIS — H34811 Central retinal vein occlusion, right eye, with macular edema: Secondary | ICD-10-CM | POA: Diagnosis not present

## 2021-11-23 DIAGNOSIS — H34813 Central retinal vein occlusion, bilateral, with macular edema: Secondary | ICD-10-CM | POA: Diagnosis not present

## 2021-11-23 DIAGNOSIS — H35372 Puckering of macula, left eye: Secondary | ICD-10-CM | POA: Diagnosis not present

## 2021-11-26 NOTE — Assessment & Plan Note (Signed)
Chronic, well controlled ? ?He is doing well on hydrocodone 5/325 mg p.o. twice daily as needed.  PDMP was reviewed during this encounter and there were no red flags. ?

## 2021-11-26 NOTE — Assessment & Plan Note (Signed)
Chronic, improved control ? ?He is doing very well on the Tresiba 60 units and  dulaglutide 4.5 mg weekly.  Unfortunately recently he has run out and was not really sent it through pharmacy assistance.  I will contact our [facility] pharmacist to help figure out what the problem is. ?

## 2021-11-26 NOTE — Assessment & Plan Note (Signed)
Chronic, poor control, worsening.  Associated with diabetes. ? ?We discussed how his improved diabetes control will hopefully slow the decline of his vision.  He continues to be followed by ophthalmology closely. ?

## 2021-11-30 ENCOUNTER — Ambulatory Visit: Payer: HMO | Admitting: Cardiovascular Disease

## 2021-12-02 ENCOUNTER — Other Ambulatory Visit: Payer: Self-pay | Admitting: Family Medicine

## 2021-12-06 ENCOUNTER — Telehealth: Payer: Self-pay

## 2021-12-06 DIAGNOSIS — E11319 Type 2 diabetes mellitus with unspecified diabetic retinopathy without macular edema: Secondary | ICD-10-CM

## 2021-12-06 MED ORDER — DULAGLUTIDE 4.5 MG/0.5ML ~~LOC~~ SOAJ: 4.5000 mg | SUBCUTANEOUS | 0 refills | Status: DC

## 2021-12-06 NOTE — Telephone Encounter (Signed)
Refill sent as requested. 

## 2021-12-06 NOTE — Telephone Encounter (Signed)
Patient states needs refill sent in for Trulicity. States was getting with patient assistance but has been out for over two months. Can tell his readings have been getting high. Was taking 4.5mg  weekly.  ? ?[place] pharmacy  ?

## 2021-12-06 NOTE — Telephone Encounter (Signed)
Butch Penny Juluis Rainier) - Trulicity application was faxed [phone]. I just faxed it again 12/06/21 just in case. ?[employer] has been over a month behind on processing applications so they probably just have not gotten to his yet. ? ?Amy - Follow up with [facility] when able. ? ?

## 2021-12-06 NOTE — Addendum Note (Signed)
Addended by: Damita Lack on: 12/06/2021 10:10 AM ? ? Modules accepted: Orders ? ?

## 2021-12-06 NOTE — Telephone Encounter (Signed)
Received fax from [facility] requesting PA for Trulicity.  Spoke with [employer] and they will be faxing over PA form for [place] to complete to [phone]. ?

## 2021-12-07 NOTE — Telephone Encounter (Signed)
Great.  I am still waiting on PA forms to be faxed to me.  So he may receive the Trulicity from PAP before the PA get completed.  ?

## 2021-12-08 ENCOUNTER — Telehealth: Payer: Self-pay

## 2021-12-08 NOTE — Chronic Care Management (AMB) (Signed)
? ? ?  Chronic Care Management ?Pharmacy Assistant  ? ?Name: [name]  MRN: [id number] DOB: 1950/11/29 ? ? ?Reason for Encounter: Reminder Call ?  ?Conditions to be addressed/monitored: ?CAD, HTN, and DMII ? ? ?Medications: ?Outpatient Encounter Medications as of 12/08/2021  ?Medication Sig  ? aspirin 81 MG EC tablet Take 1 tablet (81 mg total) by mouth daily.  ? buPROPion (WELLBUTRIN XL) 150 MG 24 hr tablet Take 1 tablet (150 mg total) by mouth daily.  ? carvedilol (COREG) 6.25 MG tablet TAKE 1 TABLET BY MOUTH ONCE DAILY  ? cholecalciferol (VITAMIN D3) 25 MCG (1000 UNIT) tablet Take 2,000 Units by mouth daily.  ? clopidogrel (PLAVIX) 75 MG tablet TAKE 1 TABLET BY MOUTH ONCE DAILY WITH BREAKFAST  ? Continuous Blood Gluc Receiver (FREESTYLE LIBRE 14 DAY READER) DEVI 1 Device by Does not apply route every 14 (fourteen) days.  ? Continuous Blood Gluc Sensor (FREESTYLE LIBRE 14 DAY SENSOR) MISC USE TO CHECK BLOOD SUGAR AS DIRECTED. CHANGE EVERY 14 DAYS.  ? Dulaglutide 4.5 MG/0.5ML SOPN Inject 4.5 mg into the skin once a week. Inject 4.5 mg once weekly  ? ezetimibe (ZETIA) 10 MG tablet TAKE 1 TABLET BY MOUTH ONCE DAILY  ? furosemide (LASIX) 40 MG tablet TAKE 1 TABLET BY MOUTH DAILY CAN TAKE A SECOND DAILY DOSE AS NEEDED  ? HYDROcodone-acetaminophen (NORCO) 5-325 MG tablet Take 1 tablet by mouth 2 (two) times daily as needed for moderate pain.  ? HYDROcodone-acetaminophen (NORCO/VICODIN) 5-325 MG tablet TAKE 1 TABLET BY MOUTH TWICE A DAY AS NEEDED FOR MODERATE PAIN  ? hydrOXYzine (ATARAX) 10 MG tablet TAKE 1 TABLET BY MOUTH 3 TIMES DAILY AS NEEDED FOR ANXIETY  ? Insulin Aspart FlexPen 100 UNIT/ML SOPN Inject 13 units in the morning and 8 units in the evening  ? Insulin Pen Needle (PEN NEEDLES) 31G X 6 MM MISC Use to inject insulin 3 times a day  ? isosorbide mononitrate (IMDUR) 30 MG 24 hr tablet TAKE 1 TABLET BY MOUTH TWICE DAILY  ? losartan (COZAAR) 50 MG tablet TAKE 1 TABLET BY MOUTH ONCE DAILY  ? nitroGLYCERIN  (NITROSTAT) 0.4 MG SL tablet DISSOLVE 1 TABLET UNDER TONGUE AS NEEDEDFOR CHEST PAIN. MAY REPEAT 5 MINUTES APART 3 TIMES IF NEEDED  ? potassium chloride SA (K-DUR) 20 MEQ tablet Take 1 tablet (20 mEq total) by mouth daily.  ? TRESIBA FLEXTOUCH 100 UNIT/ML FlexTouch Pen INJECT 60 UNITS INTO THE SKIN DAILY (Patient not taking: Reported on 11/16/2021)  ? venlafaxine XR (EFFEXOR-XR) 150 MG 24 hr capsule TAKE 1 CAPSULE BY MOUTH DAILY WITH BREAKFAST. TAKE WITH EFFEXOR XR 75MG  FORA TOTAL OF 225MG   ? venlafaxine XR (EFFEXOR-XR) 75 MG 24 hr capsule TAKE 1 CAPSULE BY MOUTH ONCE DAILY. TAKEIN ADDITION TO THE 150 MG CAPSULE FOR A TOTAL DOSE OF 225MG  DAILY  ? vitamin B-12 (CYANOCOBALAMIN) 1000 MCG tablet Take 1,000 mcg by mouth daily.  ? ?No facility-administered encounter medications on file as of 12/08/2021.  ? ?[name] was contacted to remind of upcoming telephone visit with Charlene Brooke on 12/13/21 at 8:45am. Patient was reminded to have any blood glucose and blood pressure readings available for review at appointment.  ? ?Message was left reminding patient of appointment. ? ? ? ? ?Star Rating Drugs: ?Medication:  Last Fill: Day Supply ?Losartan 50mg  07/13/21 90 ?Trulicity 4.5mg   manufacturer ?Novolog   08/09/21 71 ? ?Charlene Brooke, [facility]  CPP notified ? ?Kiyona Mcnall, CCMA ?Clincal Pharmacy Assistant ?[phone] ? ? ?

## 2021-12-08 NOTE — Telephone Encounter (Signed)
PA forms completed and faxed.  Awaiting response.  Can take up to 72 hours for a decision. ?

## 2021-12-10 ENCOUNTER — Telehealth: Payer: Self-pay

## 2021-12-10 MED ORDER — BUPROPION HCL ER (XL) 300 MG PO TB24: 300.0000 mg | ORAL_TABLET | Freq: Every day | ORAL | 3 refills | Status: DC

## 2021-12-10 NOTE — Telephone Encounter (Signed)
Please let patient know I have sent in an increased dose tablet for Wellbutrin XL.  He will now take 300 mg p.o. daily ?

## 2021-12-10 NOTE — Telephone Encounter (Signed)
[name] notified as instructed by telephone. Patient states understanding.  ?

## 2021-12-10 NOTE — Progress Notes (Signed)
? ? ?  Chronic Care Management ?Pharmacy Assistant  ? ?Name: FARHAD BURLESON  MRN: [id number] DOB: 18-Sep-1950 ? ?Reason for Encounter: CCM (Wellbutrin Refill) ? ?Patient stated he could not tell a difference while taking Wellbutrin XL 150 mg 24 hr tablet that was prescribed to him on 11/16/2021. Patient stated he did take two tablets some days instead of one and felt much better. Two tablets made him feel better and gave him energy and motivation to want to do things. Patient is wanting to know if he can have a prescription of the medication for double his original dosage. Patient also stated he has run out of the original prescription of 150 mg. Patient would like prescription sent to [facility].  ? ?Al Corpus, CPP notified ? ?Claudina Lick, RMA ?Clinical Pharmacy Assistant ?[phone] ? ? ?  ?

## 2021-12-10 NOTE — Progress Notes (Signed)
? ? ?  Chronic Care Management ?Pharmacy Assistant  ? ?Name: [name]  MRN: [id number] DOB: 08-03-1951 ? ?Reason for Encounter: CCM (Trulicity 2023 PAP Approved) ?  ?Patient has been approved for Trulicity through [name] until 08/28/2022. Patient's medication was shipped on 12/08/2021 and delivered on 12/09/2021. I called patient to verify he received the shipment and to thank him for his patience during the approval process. Patient stated he did receive his medication yesterday and he already took his shot.  ? ?Al Corpus, CPP notified ? ?Claudina Lick, RMA ?Clinical Pharmacy Assistant ?[phone] ? ? ?

## 2021-12-10 NOTE — Addendum Note (Signed)
Addended byKerby Nora E on: 12/10/2021 01:42 PM ? ? Modules accepted: Orders ? ?

## 2021-12-13 ENCOUNTER — Ambulatory Visit (INDEPENDENT_AMBULATORY_CARE_PROVIDER_SITE_OTHER): Payer: HMO | Admitting: Pharmacist

## 2021-12-13 DIAGNOSIS — I110 Hypertensive heart disease with heart failure: Secondary | ICD-10-CM

## 2021-12-13 DIAGNOSIS — F331 Major depressive disorder, recurrent, moderate: Secondary | ICD-10-CM

## 2021-12-13 DIAGNOSIS — E11319 Type 2 diabetes mellitus with unspecified diabetic retinopathy without macular edema: Secondary | ICD-10-CM

## 2021-12-13 DIAGNOSIS — I25118 Atherosclerotic heart disease of native coronary artery with other forms of angina pectoris: Secondary | ICD-10-CM

## 2021-12-13 DIAGNOSIS — E1169 Type 2 diabetes mellitus with other specified complication: Secondary | ICD-10-CM

## 2021-12-13 DIAGNOSIS — F411 Generalized anxiety disorder: Secondary | ICD-10-CM

## 2021-12-13 NOTE — Progress Notes (Signed)
Patient last fills verified with [facility] pharmacy are as follows: ? ?Carvedilol 6.25 mg - 10/19/21 90ds ?Clopidogrel 75 mg - 09/23/21 90ds ?Ezetimibe 10 mg - 11/16/21 30ds ?Isosorbide 30 mg - 09/23/21 90ds ?Losartan 50 mg - 10/08/21 90ds ?Tresiba 100 units - 12/13/21 awaiting pickup ? ?Al Corpus, CPP notified ? ?Claudina Lick, RMA ?Clinical Pharmacy Assistant ?[phone] ? ? ? ? ?

## 2021-12-13 NOTE — Progress Notes (Signed)
? ?Chronic Care Management ?Pharmacy Note ? ?12/20/2021 ?Name:  [name] MRN:  [id number] DOB:  [birth year] ? ?Summary: CCM F/U visit ?-Pt was out of Trulicity for about a month but received shipment last week through PAP and reports sugars are now improving. He reports his Freestyle Elenor Legato is "beeping all the time" but he does have the original device which does not have alarms. He reports some readings in the 56s. ?-Discussed cholesterol. Statin was stopped last year, notably after most recent lipid panel when LDL was 56 (10/2020). Continues Zetia. Needs updated lipid panel to assess efficacy without statin. ? ?Recommendations/Changes made from today's visit: ?-Upgrade to [employer] 2 for high/low glucose alarms - assistant to coordinate ?-Recommend repeat lipid panel at upcoming PCP visit ? ?Plan: ?-[place] will call patient to see if he would like to [place] ?-PCP visit 02/08/22 ?-Pharmacist follow up televisit scheduled for 3 months ? ? ? ? ?Subjective: ?[name] is an 71 y.o. year old male who is a primary patient of Bedsole, Amy E, MD.  The CCM team was consulted for assistance with disease management and care coordination needs.   ? ?Engaged with patient by telephone for follow up visit in response to provider referral for pharmacy case management and/or care coordination services.  ? ?Consent to Services:  ?The patient was given information about Chronic Care Management services, agreed to services, and gave verbal consent prior to initiation of services.  Please see initial visit note for detailed documentation.  ? ?Patient Care Team: ?Jinny Sanders, MD as PCP - General ?Minna Merritts, MD as Consulting Physician (Cardiology) ?Berry Gallacher, [place], [facility] as Pharmacist (Pharmacist) ? ?Recent office visits: ?11/16/21 Dr Diona Browner OV: Annual exam - DM foot exam done. Started bupropion 150 mg. F/U 4 weeks for PHQ and GAD. ? ?11/05/21 Dr Diona Browner OV: f/u DM. Needs Trulicity  PAP renewed. ? ?Recent consult visits: ?02/17/21 Dr Rockey Situ (Cardiology): f/u CHF, CAD. No changes ? ?Hospital visits: ?09/15/21 ED Visit - chest pain. Resolved with NTG x 1 and extra lasix. EKG reassuring. Discharged home, encouraged to f/u with cardiology. ? ? ?Objective: ? ?Lab Results  ?Component Value Date  ? CREATININE 1.20 09/15/2021  ? BUN 20 09/15/2021  ? GFR 71.68 11/02/2020  ? GFRNONAA >60 09/15/2021  ? GFRAA >60 04/02/2020  ? NA 136 09/15/2021  ? K 3.7 09/15/2021  ? CALCIUM 8.6 (L) 09/15/2021  ? CO2 27 09/15/2021  ? GLUCOSE 300 (H) 09/15/2021  ? ? ?Lab Results  ?Component Value Date/Time  ? HGBA1C 7.1 (A) 11/05/2021 02:47 PM  ? HGBA1C 7.6 (A) 04/16/2021 12:13 PM  ? HGBA1C 7.7 (H) 11/02/2020 08:42 AM  ? HGBA1C 8.6 (H) 07/31/2020 08:41 AM  ? HGBA1C 9.6 10/02/2018 12:00 AM  ? GFR 71.68 11/02/2020 08:42 AM  ? GFR 76.14 07/31/2020 08:41 AM  ? MICROALBUR <0.7 02/21/2017 01:57 PM  ? MICROALBUR 0.8 02/16/2016 08:24 AM  ?  ?Last diabetic Eye exam:  ?Lab Results  ?Component Value Date/Time  ? HMDIABEYEEXA Retinopathy (A) 01/26/2021 12:00 AM  ?  ?Last diabetic Foot exam:  ?Lab Results  ?Component Value Date/Time  ? HMDIABFOOTEX done 11/01/2021 12:00 AM  ?  ? ?Lab Results  ?Component Value Date  ? CHOL 107 11/02/2020  ? HDL 34.30 (L) 11/02/2020  ? LDLCALC 56 11/02/2020  ? LDLDIRECT 174.0 10/19/2017  ? TRIG 82.0 11/02/2020  ? CHOLHDL 3 11/02/2020  ? ? ? ?  Latest Ref Rng & Units  11/02/2020  ?  8:42 AM 07/31/2020  ?  8:41 AM 04/14/2020  ?  8:34 AM  ?Hepatic Function  ?Total Protein 6.0 - 8.3 g/dL 7.4   6.7   7.2    ?Albumin 3.5 - 5.2 g/dL 3.9   3.9   4.0    ?AST 0 - 37 U/L _0 ?ALT 0 - 53 U/L _1 ?Alk Phosphatase 39 - 117 U/L 77   85   85    ?Total Bilirubin 0.2 - 1.2 mg/dL 0.4   0.4   0.3    ? ? ?Lab Results  ?Component Value Date/Time  ? TSH 2.64 10/19/2017 04:00 PM  ? TSH 1.14 03/11/2014 10:26 AM  ? FREET4 0.79 10/19/2017 04:00 PM  ? ? ? ?  Latest Ref Rng & Units 09/15/2021  ?  3:04 PM 07/31/2020  ?   8:41 AM 03/31/2020  ?  4:41 PM  ?CBC  ?WBC 4.0 - 10.5 K/uL 6.6   6.6   7.0    ?Hemoglobin 13.0 - 17.0 g/dL 12.1   13.2   13.4    ?Hematocrit 39.0 - 52.0 % 37.7   40.2   41.0    ?Platelets 150 - 400 K/uL 251   198.0   224    ? ? ?Lab Results  ?Component Value Date/Time  ? VD25OH 37.72 07/31/2020 08:41 AM  ? VD25OH 47.26 02/27/2018 09:26 AM  ? ? ?Clinical ASCVD: No  ?The ASCVD Risk score (Arnett DK, et al., 2019) failed to calculate for the following reasons: ?  The valid total cholesterol range is 130 to 320 mg/dL   ? ? ?  11/16/2021  ?  9:34 AM 11/11/2021  ?  2:02 PM 11/05/2021  ?  2:36 PM  ?Depression screen PHQ 2/9  ?Decreased Interest 2 0 0  ?Down, Depressed, Hopeless 3 0 2  ?PHQ - 2 Score 5 0 2  ?Altered sleeping 1  3  ?Tired, decreased energy 3  3  ?Change in appetite 0  0  ?Feeling bad or failure about yourself  3  2  ?Trouble concentrating 3  3  ?Moving slowly or fidgety/restless 2  0  ?Suicidal thoughts 0  0  ?PHQ-9 Score 17  13  ?Difficult doing work/chores Extremely dIfficult    ?  ? ? ?  11/16/2021  ?  9:37 AM 11/05/2021  ?  2:37 PM  ?GAD 7 : Generalized Anxiety Score  ?Nervous, Anxious, on Edge 2 3  ?Control/stop worrying 2 3  ?Worry too much - different things 3 0  ?Trouble relaxing 3 3  ?Restless 1 3  ?Easily annoyed or irritable 2 0  ?Afraid - awful might happen 1 0  ?Total GAD 7 Score 14 12  ?Anxiety Difficulty Extremely difficult   ? ? ? ?Social History  ? ?Tobacco Use  ?Smoking Status Never  ?Smokeless Tobacco Never  ? ?BP Readings from Last 3 Encounters:  ?11/16/21 136/66  ?11/05/21 136/74  ?09/15/21 (!) 153/70  ? ?Pulse Readings from Last 3 Encounters:  ?11/16/21 81  ?11/05/21 89  ?09/15/21 77  ? ?Wt Readings from Last 3 Encounters:  ?11/16/21 283 lb 4.8 oz (128.5 kg)  ?11/11/21 283 lb (128.4 kg)  ?11/05/21 283 lb (128.4 kg)  ? ?BMI Readings from Last 3 Encounters:  ?11/16/21 44.37 kg/m?  ?11/11/21 47.09 kg/m?  ?11/05/21 46.38 kg/m?  ? ? ?Assessment/Interventions: Review of  patient past medical history,  allergies, medications, health status, including review of consultants reports, laboratory and other test data, was performed as part of comprehensive evaluation and provision of chronic care management services.  ? ?SDOH:  (Social Determinants of Health) assessments and interventions performed: No ? ?SDOH Screenings  ? ?Alcohol Screen: Low Risk   ? Last Alcohol Screening Score (AUDIT): 0  ?Depression (PHQ2-9): Medium Risk  ? PHQ-2 Score: 17  ?Financial Resource Strain: Low Risk   ? Difficulty of Paying Living Expenses: Not very hard  ?Food Insecurity: No Food Insecurity  ? Worried About Charity fundraiser in the Last Year: Never true  ? Ran Out of Food in the Last Year: Never true  ?Housing: Low Risk   ? Last Housing Risk Score: 0  ?Physical Activity: Inactive  ? Days of Exercise per Week: 0 days  ? Minutes of Exercise per Session: 0 min  ?Social Connections: Moderately Isolated  ? Frequency of Communication with Friends and Family: More than three times a week  ? Frequency of Social Gatherings with Friends and Family: More than three times a week  ? Attends Religious Services: More than 4 times per year  ? Active Member of Clubs or Organizations: No  ? Attends Archivist Meetings: Never  ? Marital Status: Widowed  ?Stress: No Stress Concern Present  ? Feeling of Stress : Only a little  ?Tobacco Use: Low Risk   ? Smoking Tobacco Use: Never  ? Smokeless Tobacco Use: Never  ? Passive Exposure: Not on file  ?Transportation Needs: No Transportation Needs  ? Lack of Transportation (Medical): No  ? Lack of Transportation (Non-Medical): No  ? ? ?CCM Care Plan ? ?Allergies  ?Allergen Reactions  ? Atorvastatin Other (See Comments)  ?  Body aches ?Similar effect with rosuvastatin 40 mg twice weekly  ? ? ?Medications Reviewed Today   ? ? Reviewed by Charlton Haws, RPH (Pharmacist) on 12/13/21 at [place] List Status: <None>  ? ?Medication Order Taking? Sig Documenting Provider Last Dose Status Informant   ?aspirin 81 MG EC tablet [id number] Yes Take 1 tablet (81 mg total) by mouth daily. Theora Gianotti, NP Taking Active Self  ?         ?Med Note Pilar Plate May 01, 2016  3:28 PM)

## 2021-12-20 NOTE — Patient Instructions (Signed)
Visit Information ? ?Phone number for Pharmacist: [phone] ? ? Goals Addressed   ?None ?  ? ? ?Care Plan : CCM Pharmacy Care Plan  ?Updates made by Kathyrn Sheriff, RPH since 12/20/2021 12:00 AM  ?  ? ?Problem: Hypertension, Hyperlipidemia, Diabetes, Heart Failure, Coronary Artery Disease, and Anxiety   ?Priority: High  ?  ? ?Long-Range Goal: Patient Stated   ?Start Date: 12/31/2020  ?Expected End Date: 12/21/2022  ?This Visit's Progress: On track  ?Recent Progress: On track  ?Priority: High  ?Note:   ?Current Barriers:  ?None identified ? ?Pharmacist Clinical Goal(s):  ?Patient will achieve control of diabetes as evidence by A1c and home BG monitoring through collaboration with PharmD and provider.  ? ?Interventions: ?1:1 collaboration with [name], MD regarding development and update of comprehensive plan of care as evidenced by provider attestation and co-signature ?Inter-disciplinary care team collaboration (see longitudinal plan of care) ?Comprehensive medication review performed; medication list updated in electronic medical record ? ?Diabetes (A1c goal <7%) ?-Not ideally controlled - A1c 7.1% (10/2021); and his home BG readings are much improved. He states he is feeling a whole lot better on new insulin regimen.  ?-Current home glucose readings - using Libre, scanning 10-15 times daily; he does not have his reader during call and cannot give readings/averages ?-Current medications: ?Trulicity 4.5 mg weekly (PAP) - Appropriate, Effective, Safe, Accessible ?[name] - 60u AM -Appropriate, Effective, Safe, Accessible ?Novolog - 13u AM, 5u PM -Appropriate, Effective, Safe, Accessible ?Freestyle Libre 14-day sensor -Appropriate, Query Effective ?-Medications previously tried:  metformin ?-Current meal patterns: Daily routine - Sleeps from 3-4 AM until 10:30 or 11 AM, Eats BF at 11 AM. Evening meal (sandwich) at 4-6 PM. May have a pack of nabs before bed. ?-Reviewed rule of 15 for hypoglcyemia ?-Recommend  continue current medications; recommend upgrading to [facility] 2 for low/high alarms and improved accuracy ? ?Hyperlipidemia / CAD (LDL goal < 70) ?-Query controlled - LDL 56 (10/2020); He stopped statin July 2022. This is his second failed statin. He continues Zetia as prescribed. His cholesterol may have improved with weight loss and improved diabetes control this year. Will re-eval after next labs. ?-Hx CAD (hx NSTEMI/PCI 2017) ?-Current treatment: ?Ezetimibe 10 mg daily -Appropriate, Query Effective ?Clopidogrel 75 mg daily -Appropriate, Effective, Safe, Accessible ?Aspirin 81 mg daily -Appropriate, Effective, Safe, Accessible ?Nitroglycerin 0.4 mg SL prn -Appropriate, Effective, Safe, Accessible ?-Medications previously tried: Daily Crestor - unable to tolerate, Lipitor twice a week - unable to tolerate ?-Educated on Cholesterol goals;  ?-Recommended to continue current medication; recommend repeat lipid panel ? ?Hypertension / Heart Failure (BP goal <140/90) ?-Controlled - BP at goal in office ?-Last ejection fraction: 60-65% (Date: 03/2020) ?-HF type: Diastolic ?-Current home BP readings: n/a ?-Current treatment: ?Carvedilol 6.25 mg daily -Appropriate, Effective, Safe, Accessible ?Furosemide 40 mg daily -Appropriate, Effective, Safe, Accessible ?Isosorbide MN 30 mg BID -Appropriate, Effective, Safe, Accessible ?Losartan 50 mg daily -Appropriate, Effective, Safe, Accessible ?-Medications previously tried: n/a  ?-Denies hypotensive/hypertensive symptoms ?-Educated on BP goals and benefits of medications for prevention of heart attack, stroke and kidney damage;Daily salt intake goal < 2300 mg; ?-Counseled to monitor BP at home periodically ?-Recommended to continue current medication ? ?Depression/Anxiety (Goal: manage symptoms) ?-Controlled - pt reports improvement in energy/motivation with recent addition of bupropion ?-PHQ9: 17 (10/2021) - moderately severe depression ?-GAD7: 14 (10/2021) - moderate  anxiety ?-Connected with PCP for mental health support ?-Current treatment: ?Bupropion XL 300 mg daily -Appropriate, Effective, Safe, Accessible ?  Venlafaxine XR 150 mg daily -Appropriate, Effective, Safe, Accessible ?Venlafaxine XR 75 mg daily -Appropriate, Effective, Safe, Accessible ?Hydroxyzine 10 mg TID PRN - not taking ?-Medications previously tried/failed: n/a ?-Educated on Benefits of medication for symptom control ?-Recommended to continue current medication ? ?Patient Goals/Self-Care Activities ?Patient will:  ?- continue to take medications as prescribed  ?- call if BG < 70 ?  ?  ? ?Patient verbalizes understanding of instructions and care plan provided today and agrees to view in MyChart. Active MyChart status confirmed with patient.   ?Telephone follow up appointment with pharmacy team member scheduled for: 3 months ? ?Al Corpus, PharmD, BCACP ?Clinical Pharmacist ?[facility] ?[phone] ?  ?

## 2021-12-21 ENCOUNTER — Ambulatory Visit: Payer: HMO | Admitting: Family Medicine

## 2021-12-21 ENCOUNTER — Telehealth: Payer: Self-pay

## 2021-12-21 DIAGNOSIS — E11319 Type 2 diabetes mellitus with unspecified diabetic retinopathy without macular edema: Secondary | ICD-10-CM

## 2021-12-21 MED ORDER — FREESTYLE LIBRE 2 READER DEVI: 0 refills | Status: DC

## 2021-12-21 MED ORDER — FREESTYLE LIBRE 2 SENSOR MISC: 5 refills | Status: DC

## 2021-12-21 NOTE — Progress Notes (Signed)
? ? ?  Chronic Care Management ?Pharmacy Assistant  ? ?Name: [name]  MRN: [id number] DOB: 12-20-1950 ? ?Reason for Encounter: CCM (Diabetic Meter) ?  ?Spoke with patient and he would like to proceed with getting an upgrade of the [employer] 2. I advised him that he would be getting a call in regards to the [facility] 2 and it was not a scam and he should speak with them. Patient understood and agreed.  ? ?Al Corpus, CPP notified ? ?Claudina Lick, RMA ?Clinical Pharmacy Assistant ?[phone] ? ? ?

## 2021-12-21 NOTE — Addendum Note (Signed)
Addended by: Kathyrn Sheriff on: 12/21/2021 09:33 AM ? ? Modules accepted: Orders ? ?

## 2021-12-21 NOTE — Telephone Encounter (Signed)
Ordered Freestyle Libre 2 to [employer]. ? ?

## 2021-12-23 ENCOUNTER — Ambulatory Visit (INDEPENDENT_AMBULATORY_CARE_PROVIDER_SITE_OTHER): Payer: HMO | Admitting: Family Medicine

## 2021-12-23 ENCOUNTER — Encounter: Payer: Self-pay | Admitting: Family Medicine

## 2021-12-23 VITALS — BP 128/78 | HR 74 | Ht 66.0 in | Wt 279.8 lb

## 2021-12-23 DIAGNOSIS — E11319 Type 2 diabetes mellitus with unspecified diabetic retinopathy without macular edema: Secondary | ICD-10-CM

## 2021-12-23 DIAGNOSIS — Z794 Long term (current) use of insulin: Secondary | ICD-10-CM | POA: Diagnosis not present

## 2021-12-23 DIAGNOSIS — E162 Hypoglycemia, unspecified: Secondary | ICD-10-CM | POA: Diagnosis not present

## 2021-12-23 LAB — POCT GLUCOSE (DEVICE FOR HOME USE): POC Glucose: 91 mg/dl (ref 70–99)

## 2021-12-23 NOTE — Patient Instructions (Addendum)
Hold aspart for now, altogether ? Decrease Triseba to  30 units daily unless tommorow morning you continue to have low.. in that case hold the dose entirely. ?Call with blood sugars tommorrow. ? ?If blood sugar continues to drop despite eating later today got TO ER for sugar management overnight. ?

## 2021-12-23 NOTE — Progress Notes (Signed)
Patient ID: [name], male    DOB: Dec 16, 1950, 71 y.o.   MRN: [id number]  This visit was conducted in person.  BP 128/78   Pulse 74   Ht 5\' 6"  (1.676 m)   Wt 279 lb 12.8 oz (126.9 kg)   SpO2 95%   BMI 45.16 kg/m    CC:  Chief Complaint  Patient presents with   Blood Sugar Problem    Pt notice that his bloods has been running low the last 3 days in the 40's  and then goes up , he can eat some then it goes up , he can tell that his gait is off     Subjective:   HPI: [name] is a 71 y.o. male presenting on 12/23/2021 for Blood Sugar Problem (Pt notice that his bloods has been running low the last 3 days in the 40's  and then goes up , he can eat some then it goes up , he can tell that his gait is off )  Diabetes, with new hypoglycemia.  He notes that in the last 3 days he has had lower blood sugars in the 40s.  He has noted day and night.  Occ after eating BS  110   BS 40  now in office on continuous glucose monitor.  CBG on finger stick 46.  He is currently on 4.5 mg weekly of dulaglutide ( takes on Thursday), insulin and aspart 13 units in the morning and 5 units in the evening And Tresiba 60 units daily.  He took all these today. He feels weak and unsteady. Lab Results  Component Value Date   HGBA1C 7.1 (A) 11/05/2021    No dysuria, no skin rash, no cough.     Wt Readings from Last 3 Encounters:  12/23/21 279 lb 12.8 oz (126.9 kg)  11/16/21 283 lb 4.8 oz (128.5 kg)  11/11/21 283 lb (128.4 kg)     Relevant past medical, surgical, family and social history reviewed and updated as indicated. Interim medical history since our last visit reviewed. Allergies and medications reviewed and updated. Outpatient Medications Prior to Visit  Medication Sig Dispense Refill   aspirin 81 MG EC tablet Take 1 tablet (81 mg total) by mouth daily.     buPROPion (WELLBUTRIN XL) 300 MG 24 hr tablet Take 1 tablet (300 mg total) by mouth daily. 30 tablet 3   carvedilol  (COREG) 6.25 MG tablet TAKE 1 TABLET BY MOUTH ONCE DAILY 90 tablet 3   cholecalciferol (VITAMIN D3) 25 MCG (1000 UNIT) tablet Take 2,000 Units by mouth daily.     clopidogrel (PLAVIX) 75 MG tablet TAKE 1 TABLET BY MOUTH ONCE DAILY WITH BREAKFAST 90 tablet 1   Continuous Blood Gluc Receiver (FREESTYLE LIBRE 2 READER) DEVI Use with sensors to monitor sugar continuously 1 each 0   Continuous Blood Gluc Sensor (FREESTYLE LIBRE 2 SENSOR) MISC Apply sensor every 14 days to monitor sugar continously 2 each 5   Dulaglutide 4.5 MG/0.5ML SOPN Inject 4.5 mg into the skin once a week. Inject 4.5 mg once weekly 2 mL 0   ezetimibe (ZETIA) 10 MG tablet TAKE 1 TABLET BY MOUTH ONCE DAILY 30 tablet 10   furosemide (LASIX) 40 MG tablet TAKE 1 TABLET BY MOUTH DAILY CAN TAKE A SECOND DAILY DOSE AS NEEDED 180 tablet 1   HYDROcodone-acetaminophen (NORCO) 5-325 MG tablet Take 1 tablet by mouth 2 (two) times daily as needed for moderate pain. 60 tablet 0  HYDROcodone-acetaminophen (NORCO/VICODIN) 5-325 MG tablet TAKE 1 TABLET BY MOUTH TWICE A DAY AS NEEDED FOR MODERATE PAIN 60 tablet 0   hydrOXYzine (ATARAX) 10 MG tablet TAKE 1 TABLET BY MOUTH 3 TIMES DAILY AS NEEDED FOR ANXIETY 30 tablet 2   Insulin Aspart FlexPen 100 UNIT/ML SOPN Inject 13 units in the morning and 8 units in the evening (Patient taking differently: Inject 13 units in the morning and 5 units in the evening) 15 mL 6   Insulin Pen Needle (PEN NEEDLES) 31G X 6 MM MISC Use to inject insulin 3 times a day 300 each 3   isosorbide mononitrate (IMDUR) 30 MG 24 hr tablet TAKE 1 TABLET BY MOUTH TWICE DAILY 180 tablet 1   losartan (COZAAR) 50 MG tablet TAKE 1 TABLET BY MOUTH ONCE DAILY 90 tablet 1   nitroGLYCERIN (NITROSTAT) 0.4 MG SL tablet DISSOLVE 1 TABLET UNDER TONGUE AS NEEDEDFOR CHEST PAIN. MAY REPEAT 5 MINUTES APART 3 TIMES IF NEEDED 25 tablet 3   TRESIBA FLEXTOUCH 100 UNIT/ML FlexTouch Pen INJECT 60 UNITS INTO THE SKIN DAILY 15 mL 3   venlafaxine XR  (EFFEXOR-XR) 150 MG 24 hr capsule TAKE 1 CAPSULE BY MOUTH DAILY WITH BREAKFAST. TAKE WITH EFFEXOR XR 75MG  FORA TOTAL OF 225MG  90 capsule 1   venlafaxine XR (EFFEXOR-XR) 75 MG 24 hr capsule TAKE 1 CAPSULE BY MOUTH ONCE DAILY. TAKEIN ADDITION TO THE 150 MG CAPSULE FOR A TOTAL DOSE OF 225MG  DAILY 90 capsule 1   vitamin B-12 (CYANOCOBALAMIN) 1000 MCG tablet Take 1,000 mcg by mouth daily.     No facility-administered medications prior to visit.     Per HPI unless specifically indicated in ROS section below Review of Systems  Constitutional:  Positive for fatigue. Negative for fever.  HENT:  Negative for ear pain.   Eyes:  Negative for pain.  Respiratory:  Negative for cough and shortness of breath.   Cardiovascular:  Negative for chest pain, palpitations and leg swelling.  Gastrointestinal:  Negative for abdominal pain.  Genitourinary:  Negative for dysuria.  Musculoskeletal:  Negative for arthralgias.  Neurological:  Positive for weakness and light-headedness. Negative for syncope and headaches.  Psychiatric/Behavioral:  Negative for dysphoric mood.   Objective:  BP 128/78   Pulse 74   Ht 5\' 6"  (1.676 m)   Wt 279 lb 12.8 oz (126.9 kg)   SpO2 95%   BMI 45.16 kg/m   Wt Readings from Last 3 Encounters:  12/23/21 279 lb 12.8 oz (126.9 kg)  11/16/21 283 lb 4.8 oz (128.5 kg)  11/11/21 283 lb (128.4 kg)      Physical Exam Constitutional:      Appearance: He is well-developed.  HENT:     Head: Normocephalic.     Right Ear: Hearing normal.     Left Ear: Hearing normal.     Nose: Nose normal.  Neck:     Thyroid: No thyroid mass or thyromegaly.     Vascular: No carotid bruit.     Trachea: Trachea normal.  Cardiovascular:     Rate and Rhythm: Normal rate and regular rhythm.     Pulses: Normal pulses.     Heart sounds: Heart sounds not distant. No murmur heard.   No friction rub. No gallop.     Comments: No peripheral edema Pulmonary:     Effort: Pulmonary effort is normal. No  respiratory distress.     Breath sounds: Normal breath sounds.  Skin:    General: Skin is warm and dry.  Findings: No rash.  Psychiatric:        Speech: Speech normal.        Behavior: Behavior normal.        Thought Content: Thought content normal.      Results for orders placed or performed in visit on 11/16/21  HM DIABETES FOOT EXAM  Result Value Ref Range   HM Diabetic Foot Exam done     This visit occurred during the SARS-CoV-2 public health emergency.  Safety protocols were in place, including screening questions prior to the visit, additional usage of staff PPE, and extensive cleaning of exam room while observing appropriate contact time as indicated for disinfecting solutions.   COVID 19 screen:  No recent travel or known exposure to COVID19 The patient denies respiratory symptoms of COVID 19 at this time. The importance of social distancing was discussed today.   Assessment and Plan    Problem List Items Addressed This Visit     Diabetes mellitus type 2 with retinopathy ([place]) - Primary    Chronic, now with new hypoglycemia.  Hold aspart for now, altogether  Decrease Triseba to  30 units daily unless tommorow morning you continue to have low.. in that case hold the dose entirely. Call with blood sugars tommorrow.  If blood sugar continues to drop despite eating later today got TO ER for sugar management overnight.       Relevant Orders   POCT Glucose (Device for Home Use) (Completed)   Other Visit Diagnoses     Hypoglycemia             [name], MD

## 2021-12-26 DIAGNOSIS — Z794 Long term (current) use of insulin: Secondary | ICD-10-CM

## 2021-12-26 DIAGNOSIS — I10 Essential (primary) hypertension: Secondary | ICD-10-CM | POA: Diagnosis not present

## 2021-12-26 DIAGNOSIS — E785 Hyperlipidemia, unspecified: Secondary | ICD-10-CM | POA: Diagnosis not present

## 2021-12-26 DIAGNOSIS — E1169 Type 2 diabetes mellitus with other specified complication: Secondary | ICD-10-CM | POA: Diagnosis not present

## 2021-12-26 DIAGNOSIS — E11319 Type 2 diabetes mellitus with unspecified diabetic retinopathy without macular edema: Secondary | ICD-10-CM | POA: Diagnosis not present

## 2021-12-26 DIAGNOSIS — F331 Major depressive disorder, recurrent, moderate: Secondary | ICD-10-CM

## 2021-12-26 DIAGNOSIS — I25118 Atherosclerotic heart disease of native coronary artery with other forms of angina pectoris: Secondary | ICD-10-CM | POA: Diagnosis not present

## 2021-12-30 ENCOUNTER — Telehealth: Payer: Self-pay

## 2021-12-30 NOTE — Chronic Care Management (AMB) (Signed)
Chronic Care Management Pharmacy Assistant   Name: [name]  MRN: [id number] DOB: 09-Feb-1951  Reason for Encounter: Diabetes Disease State   Recent office visits:  12/23/21-William Bedsole,MD(PCP)- hypoglycemia- hold aspart for now, decrease tresiba to 30 units daily.  Recent consult visits:  None since last CCM contact  Hospital visits:  None in previous 6 months  Medications: Outpatient Encounter Medications as of 12/30/2021  Medication Sig   aspirin 81 MG EC tablet Take 1 tablet (81 mg total) by mouth daily.   buPROPion (WELLBUTRIN XL) 300 MG 24 hr tablet Take 1 tablet (300 mg total) by mouth daily.   carvedilol (COREG) 6.25 MG tablet TAKE 1 TABLET BY MOUTH ONCE DAILY   cholecalciferol (VITAMIN D3) 25 MCG (1000 UNIT) tablet Take 2,000 Units by mouth daily.   clopidogrel (PLAVIX) 75 MG tablet TAKE 1 TABLET BY MOUTH ONCE DAILY WITH BREAKFAST   Continuous Blood Gluc Receiver (FREESTYLE LIBRE 2 READER) DEVI Use with sensors to monitor sugar continuously   Continuous Blood Gluc Sensor (FREESTYLE LIBRE 2 SENSOR) MISC Apply sensor every 14 days to monitor sugar continously   Dulaglutide 4.5 MG/0.5ML SOPN Inject 4.5 mg into the skin once a week. Inject 4.5 mg once weekly   ezetimibe (ZETIA) 10 MG tablet TAKE 1 TABLET BY MOUTH ONCE DAILY   furosemide (LASIX) 40 MG tablet TAKE 1 TABLET BY MOUTH DAILY CAN TAKE A SECOND DAILY DOSE AS NEEDED   HYDROcodone-acetaminophen (NORCO) 5-325 MG tablet Take 1 tablet by mouth 2 (two) times daily as needed for moderate pain.   HYDROcodone-acetaminophen (NORCO/VICODIN) 5-325 MG tablet TAKE 1 TABLET BY MOUTH TWICE A DAY AS NEEDED FOR MODERATE PAIN   hydrOXYzine (ATARAX) 10 MG tablet TAKE 1 TABLET BY MOUTH 3 TIMES DAILY AS NEEDED FOR ANXIETY   Insulin Aspart FlexPen 100 UNIT/ML SOPN Inject 13 units in the morning and 8 units in the evening (Patient taking differently: Inject 13 units in the morning and 5 units in the evening)   Insulin Pen Needle (PEN  NEEDLES) 31G X 6 MM MISC Use to inject insulin 3 times a day   isosorbide mononitrate (IMDUR) 30 MG 24 hr tablet TAKE 1 TABLET BY MOUTH TWICE DAILY   losartan (COZAAR) 50 MG tablet TAKE 1 TABLET BY MOUTH ONCE DAILY   nitroGLYCERIN (NITROSTAT) 0.4 MG SL tablet DISSOLVE 1 TABLET UNDER TONGUE AS NEEDEDFOR CHEST PAIN. MAY REPEAT 5 MINUTES APART 3 TIMES IF NEEDED   TRESIBA FLEXTOUCH 100 UNIT/ML FlexTouch Pen INJECT 60 UNITS INTO THE SKIN DAILY   venlafaxine XR (EFFEXOR-XR) 150 MG 24 hr capsule TAKE 1 CAPSULE BY MOUTH DAILY WITH BREAKFAST. TAKE WITH EFFEXOR XR 75MG  FORA TOTAL OF 225MG    venlafaxine XR (EFFEXOR-XR) 75 MG 24 hr capsule TAKE 1 CAPSULE BY MOUTH ONCE DAILY. TAKEIN ADDITION TO THE 150 MG CAPSULE FOR A TOTAL DOSE OF 225MG  DAILY   vitamin B-12 (CYANOCOBALAMIN) 1000 MCG tablet Take 1,000 mcg by mouth daily.   No facility-administered encounter medications on file as of 12/30/2021.    Recent Relevant Labs: Lab Results  Component Value Date/Time   HGBA1C 7.1 (A) 11/05/2021 02:47 PM   HGBA1C 7.6 (A) 04/16/2021 12:13 PM   HGBA1C 7.7 (H) 11/02/2020 08:42 AM   HGBA1C 8.6 (H) 07/31/2020 08:41 AM   HGBA1C 9.6 10/02/2018 12:00 AM   MICROALBUR <0.7 02/21/2017 01:57 PM   MICROALBUR 0.8 02/16/2016 08:24 AM    Kidney Function Lab Results  Component Value Date/Time   CREATININE 1.20 09/15/2021  03:04 PM   CREATININE 1.05 11/02/2020 08:42 AM   GFR 71.68 11/02/2020 08:42 AM   GFRNONAA >60 09/15/2021 03:04 PM   GFRAA >60 04/02/2020 06:17 AM     Contacted patient on 01/04/22 to discuss diabetes disease state.   Current antihyperglycemic regimen:  Trulicity 4.5 mg weekly (PAP) Thursdays  [name] - 60u AM  Novolog - 13u AM, 5u PM  Freestyle Libre 14-day sensor- has not started new one yet     Patient verbally confirms he is taking the above medications as directed. Yes  What diet changes have been made to improve diabetes control? Limits sugar intake   What recent interventions/DTPs have been  made to improve glycemic control:  -Upgrade to [employer] 2 for high/low glucose alarms - assistant to coordinate-not complete yet. -Recommend repeat lipid panel at upcoming PCP visit-scheduled 02/01/22. -recommended decrease in aspart and tresiba, hypoglycemia continued, patient went back to previous dose of both and BG's have went back to normal range.   Have there been any recent hospitalizations or ED visits since last visit with CPP? No  Patient reports hypoglycemic symptoms, including Sweaty, Shaky, and Nervous/irritable  Patient denies hyperglycemic symptoms, including blurry vision, excessive thirst, and polyuria  How often are you checking your blood sugar? twice daily  once in morning , once at bedtime   What are your blood sugars ranging?  Fasting: 110,108,113,118 Bedtime: 140,160,144,156  During the week, how often does your blood glucose drop below 70?   2 occasions after PCP changed dose.12/23/21-William Bedsole,MD(PCP)- hypoglycemia- hold aspart for now, decrease tresiba to 30 units daily.  The 15-15 rule for low sugars: If sugar reading below 70, have 15 grams of carbohydrate to raise your blood sugar and check it after 15 minutes. If it's still below 70 mg/dL, have another serving. 15 grams of carbs may be: -Glucose tablets (see instructions) -Gel tube (see instructions) -4 ounces (1/2 cup) of juice or regular soda (not diet) -1 tablespoon of sugar, honey, or corn syrup -Hard candies, jellybeans or gumdrops--see food label for how many to consume  Repeat these steps until your blood sugar is at least 70 mg/dL. Once your blood sugar is back to normal, eat a meal or snack to make sure it doesn't lower again.    Are you checking your feet daily/regularly? Yes  Adherence Review: Is the patient currently on a STATIN medication? No Is the patient currently on ACE/ARB medication? Yes Does the patient have >5 day gap between last estimated fill dates? No   Patient states he  does not miss doses of medications.  Care Gaps: Annual wellness visit in last year? Yes Most recent A1C reading:7.1  11/05/21 Most Recent BP reading:128/78  74-P  Last eye exam / retinopathy screening:UTD Last diabetic foot exam: UTD  Counseled patient on importance of annual eye and foot exam. UTD   Star Rating Drugs:  Medication:  Last Fill: Day Supply Losartan 50mg  10/08/21 90 Trulicity  manufacturer Tresiba  08/09/21 25  Patient uses [place] [facility]  Novolog 100 unit/ml 08/09/21 71  CCM appointment on 03/23/22  03/25/22, CPP notified  Al Corpus, [facility] concierge  [phone]

## 2022-01-03 ENCOUNTER — Other Ambulatory Visit: Payer: Self-pay | Admitting: Family Medicine

## 2022-01-04 ENCOUNTER — Other Ambulatory Visit: Payer: Self-pay | Admitting: Family Medicine

## 2022-01-10 ENCOUNTER — Other Ambulatory Visit: Payer: Self-pay | Admitting: Cardiovascular Disease

## 2022-01-10 ENCOUNTER — Other Ambulatory Visit: Payer: Self-pay | Admitting: Family Medicine

## 2022-01-31 ENCOUNTER — Other Ambulatory Visit: Payer: Self-pay | Admitting: Cardiovascular Disease

## 2022-02-01 ENCOUNTER — Other Ambulatory Visit (INDEPENDENT_AMBULATORY_CARE_PROVIDER_SITE_OTHER): Payer: HMO

## 2022-02-01 DIAGNOSIS — E538 Deficiency of other specified B group vitamins: Secondary | ICD-10-CM

## 2022-02-01 DIAGNOSIS — E559 Vitamin D deficiency, unspecified: Secondary | ICD-10-CM | POA: Diagnosis not present

## 2022-02-01 LAB — VITAMIN D 25 HYDROXY (VIT D DEFICIENCY, FRACTURES): VITD: 47.61 ng/mL (ref 30.00–100.00)

## 2022-02-01 LAB — VITAMIN B12: Vitamin B-12: 355 pg/mL (ref 211–911)

## 2022-02-01 NOTE — Assessment & Plan Note (Signed)
Chronic, now with new hypoglycemia.  Hold aspart for now, altogether  Decrease Triseba to  30 units daily unless tommorow morning you continue to have low.. in that case hold the dose entirely. Call with blood sugars tommorrow.  If blood sugar continues to drop despite eating later today got TO ER for sugar management overnight.

## 2022-02-01 NOTE — Progress Notes (Signed)
No critical labs need to be addressed urgently. We will discuss labs in detail at upcoming office visit.   

## 2022-02-02 ENCOUNTER — Other Ambulatory Visit: Payer: Self-pay | Admitting: Cardiovascular Disease

## 2022-02-08 ENCOUNTER — Ambulatory Visit (INDEPENDENT_AMBULATORY_CARE_PROVIDER_SITE_OTHER): Payer: HMO | Admitting: Family Medicine

## 2022-02-08 ENCOUNTER — Encounter: Payer: Self-pay | Admitting: Family Medicine

## 2022-02-08 VITALS — BP 142/90 | HR 80 | Temp 98.0°F | Ht 66.0 in | Wt 275.0 lb

## 2022-02-08 DIAGNOSIS — I5033 Acute on chronic diastolic (congestive) heart failure: Secondary | ICD-10-CM | POA: Diagnosis not present

## 2022-02-08 DIAGNOSIS — G8929 Other chronic pain: Secondary | ICD-10-CM

## 2022-02-08 DIAGNOSIS — E538 Deficiency of other specified B group vitamins: Secondary | ICD-10-CM | POA: Diagnosis not present

## 2022-02-08 DIAGNOSIS — E1159 Type 2 diabetes mellitus with other circulatory complications: Secondary | ICD-10-CM | POA: Diagnosis not present

## 2022-02-08 DIAGNOSIS — M545 Low back pain, unspecified: Secondary | ICD-10-CM | POA: Diagnosis not present

## 2022-02-08 DIAGNOSIS — E559 Vitamin D deficiency, unspecified: Secondary | ICD-10-CM | POA: Diagnosis not present

## 2022-02-08 DIAGNOSIS — I152 Hypertension secondary to endocrine disorders: Secondary | ICD-10-CM

## 2022-02-08 DIAGNOSIS — Z794 Long term (current) use of insulin: Secondary | ICD-10-CM

## 2022-02-08 DIAGNOSIS — E11319 Type 2 diabetes mellitus with unspecified diabetic retinopathy without macular edema: Secondary | ICD-10-CM

## 2022-02-08 LAB — POCT GLYCOSYLATED HEMOGLOBIN (HGB A1C): Hemoglobin A1C: 7.5 % — AB (ref 4.0–5.6)

## 2022-02-08 MED ORDER — HYDROCODONE-ACETAMINOPHEN 5-325 MG PO TABS: 1.0000 | ORAL_TABLET | Freq: Two times a day (BID) | ORAL | 0 refills | Status: DC | PRN

## 2022-02-08 MED ORDER — HYDROCODONE-ACETAMINOPHEN 5-325 MG PO TABS: ORAL_TABLET | ORAL | 0 refills | Status: DC

## 2022-02-08 NOTE — Assessment & Plan Note (Signed)
Secondary to chronic back pain.  Encouraged him to use additional dose of Tylenol midday with maximum Tylenol use less than 4000 mg in a 24-hour period instead of increasing hydrocodone.  We will continue the hydrocodone twice daily.  Prescriptions for the next 3 months were completed.  He will follow-up in 3 months for chronic pain management.

## 2022-02-08 NOTE — Assessment & Plan Note (Signed)
Chronic, above goal today in office.  On recheck lower at 140/92.  He will continue the Coreg 6.25 mg daily and the losartan 50 mg p.o. daily. We will reevaluate at the next office visit as he has no home cuff.

## 2022-02-08 NOTE — Assessment & Plan Note (Signed)
Chronic, well controlled on supplementation. ?

## 2022-02-08 NOTE — Assessment & Plan Note (Addendum)
Chronic, slightly worsened A1c but patient has been having issues with hypoglycemia and was on lower doses of medication for a while.  He seems to be doing better back on his usual regimen.  He is continuing to lose weight on the dulaglutide 4.5 mg weekly.  Given occasional low blood sugars later in the day I encouraged him to consider holding the p.m. dose of aspart.  I do not want him to be chronically eating to keep his blood sugar up.

## 2022-02-08 NOTE — Patient Instructions (Addendum)
If blood sugars are dropping low later in the day..  can hold the  afternoon Aspart.  Instead of taking extra hydrocodone.. try 2 extra strength tylenol mid day for  breakthrough day on the days you are more active.

## 2022-02-08 NOTE — Addendum Note (Signed)
Addended byKerby Nora E on: 02/08/2022 02:15 PM   Modules accepted: Orders

## 2022-02-08 NOTE — Progress Notes (Addendum)
Patient ID: [name], male    DOB: 07/23/1951, 71 y.o.   MRN: [id number]  This visit was conducted in person.  BP (!) 160/70   Pulse 80   Temp 98 F (36.7 C)   Ht 5\' 6"  (1.676 m)   Wt 275 lb (124.7 kg)   SpO2 94%   BMI 44.39 kg/m    CC:  Chief Complaint  Patient presents with   chronic pain management    Pt here to f/u with chronic pain/DM   Diabetes    Subjective:   HPI: [name] is a 71 y.o. male presenting on 02/08/2022 for chronic pain management (Pt here to f/u with chronic pain/DM) and Diabetes  Indication for chronic opioid: Chronic back pain Medication and dose:  hydrocodone 1 tablet  BID as needed # pills per month: 60.. running out of med early given occ taking three a day. Last UDS date:  05/07/2021 Opioid Treatment Agreement signed (Y/N): Y Opioid Treatment Agreement last reviewed with patient:   Y NCCSRS reviewed this encounter (include red flags):   No red flags, PDMP reviewed during this encounter.  Diabetes:  A1C: 7.5  today In April he had issues with hypoglycemia.  At that time we decreased Tresiba to 30 units daily and held the aspart. Per CCM note on 12/30/2021 it was noted that his blood sugars had returned to the normal range and he was no longer having issues with hypoglycemia.  He is now back on previous dose of meds for 1 month.... Using medications without difficulties: none Hypoglycemic episodes:  occ late in the day Hyperglycemic episodes: none Feet problems: none Blood Sugars averaging: FBS 90-120 eye exam within last year: yes  Wt Readings from Last 3 Encounters:  02/08/22 275 lb (124.7 kg)  12/23/21 279 lb 12.8 oz (126.9 kg)  11/16/21 283 lb 4.8 oz (128.5 kg)    Hypertension:  Inadequate control in office today despite losartan 50 mg daily and Coreg 6.25 mg once daily. BP Readings from Last 3 Encounters:  02/08/22 (!) 154/72  12/23/21 128/78  11/16/21 136/66  Using medication without problems or lightheadedness:   none Chest pain with exertion: none Edema: none Short of breath: none Average home BPs: Other issues:     B12 and Vit d  in nml range on supplement   Relevant past medical, surgical, family and social history reviewed and updated as indicated. Interim medical history since our last visit reviewed. Allergies and medications reviewed and updated. Outpatient Medications Prior to Visit  Medication Sig Dispense Refill   aspirin 81 MG EC tablet Take 1 tablet (81 mg total) by mouth daily.     buPROPion (WELLBUTRIN XL) 300 MG 24 hr tablet Take 1 tablet (300 mg total) by mouth daily. 30 tablet 3   carvedilol (COREG) 6.25 MG tablet TAKE 1 TABLET BY MOUTH ONCE DAILY 90 tablet 3   cholecalciferol (VITAMIN D3) 25 MCG (1000 UNIT) tablet Take 2,000 Units by mouth daily.     clopidogrel (PLAVIX) 75 MG tablet TAKE 1 TABLET BY MOUTH ONCE DAILY WITH BREAKFAST 90 tablet 3   Continuous Blood Gluc Receiver (FREESTYLE LIBRE 2 READER) DEVI Use with sensors to monitor sugar continuously 1 each 0   Continuous Blood Gluc Sensor (FREESTYLE LIBRE 2 SENSOR) MISC Apply sensor every 14 days to monitor sugar continously 2 each 5   Dulaglutide 4.5 MG/0.5ML SOPN Inject 4.5 mg into the skin once a week. Inject 4.5 mg once weekly 2  mL 0   ezetimibe (ZETIA) 10 MG tablet TAKE 1 TABLET BY MOUTH ONCE DAILY 30 tablet 0   furosemide (LASIX) 40 MG tablet TAKE 1 TABLET BY MOUTH DAILY CAN TAKE A SECOND DAILY DOSE AS NEEDED 180 tablet 1   HYDROcodone-acetaminophen (NORCO) 5-325 MG tablet Take 1 tablet by mouth 2 (two) times daily as needed for moderate pain. 60 tablet 0   HYDROcodone-acetaminophen (NORCO/VICODIN) 5-325 MG tablet TAKE 1 TABLET BY MOUTH TWICE A DAY AS NEEDED FOR MODERATE PAIN 60 tablet 0   hydrOXYzine (ATARAX) 10 MG tablet TAKE 1 TABLET BY MOUTH 3 TIMES DAILY AS NEEDED FOR ANXIETY 30 tablet 2   Insulin Aspart FlexPen 100 UNIT/ML SOPN Inject 13 units in the morning and 8 units in the evening (Patient taking differently:  Inject 13 units in the morning and 5 units in the evening) 15 mL 6   Insulin Pen Needle (PEN NEEDLES) 31G X 6 MM MISC Use to inject insulin 3 times a day 300 each 3   isosorbide mononitrate (IMDUR) 30 MG 24 hr tablet TAKE 1 TABLET BY MOUTH TWICE DAILY 180 tablet 1   losartan (COZAAR) 50 MG tablet TAKE 1 TABLET BY MOUTH ONCE DAILY 90 tablet 1   nitroGLYCERIN (NITROSTAT) 0.4 MG SL tablet DISSOLVE 1 TABLET UNDER TONGUE AS NEEDEDFOR CHEST PAIN. MAY REPEAT 5 MINUTES APART 3 TIMES IF NEEDED 25 tablet 3   TRESIBA FLEXTOUCH 100 UNIT/ML FlexTouch Pen INJECT 60 UNITS INTO THE SKIN DAILY 15 mL 3   venlafaxine XR (EFFEXOR-XR) 150 MG 24 hr capsule TAKE 1 CAPSULE BY MOUTH DAILY WITH BREAKFAST. TAKE WITH EFFEXOR XR 75MG  FORA TOTAL OF 225MG  90 capsule 1   venlafaxine XR (EFFEXOR-XR) 75 MG 24 hr capsule TAKE 1 CAPSULE BY MOUTH ONCE DAILY. TAKEIN ADDITION TO THE 150 MG CAPSULE FOR A TOTAL DOSE OF 225MG  DAILY 90 capsule 1   vitamin B-12 (CYANOCOBALAMIN) 1000 MCG tablet Take 1,000 mcg by mouth daily.     No facility-administered medications prior to visit.     Per HPI unless specifically indicated in ROS section below Review of Systems  Constitutional:  Negative for fatigue and fever.  HENT:  Negative for ear pain.   Eyes:  Negative for pain.  Respiratory:  Negative for cough and shortness of breath.   Cardiovascular:  Negative for chest pain, palpitations and leg swelling.  Gastrointestinal:  Negative for abdominal pain.  Genitourinary:  Negative for dysuria.  Musculoskeletal:  Positive for back pain. Negative for arthralgias.  Neurological:  Negative for syncope, light-headedness and headaches.  Psychiatric/Behavioral:  Negative for dysphoric mood.    Objective:  BP (!) 160/70   Pulse 80   Temp 98 F (36.7 C)   Ht 5\' 6"  (1.676 m)   Wt 275 lb (124.7 kg)   SpO2 94%   BMI 44.39 kg/m   Wt Readings from Last 3 Encounters:  02/08/22 275 lb (124.7 kg)  12/23/21 279 lb 12.8 oz (126.9 kg)  11/16/21 283  lb 4.8 oz (128.5 kg)      Physical Exam Constitutional:      Appearance: He is well-developed. He is obese.  HENT:     Head: Normocephalic.     Right Ear: Hearing normal.     Left Ear: Hearing normal.     Nose: Nose normal.  Neck:     Thyroid: No thyroid mass or thyromegaly.     Vascular: No carotid bruit.     Trachea: Trachea normal.  Cardiovascular:  Rate and Rhythm: Normal rate and regular rhythm.     Pulses: Normal pulses.     Heart sounds: Heart sounds not distant. No murmur heard.    No friction rub. No gallop.     Comments: No peripheral edema Pulmonary:     Effort: Pulmonary effort is normal. No respiratory distress.     Breath sounds: Normal breath sounds.  Skin:    General: Skin is warm and dry.     Findings: No rash.  Psychiatric:        Speech: Speech normal.        Behavior: Behavior normal.        Thought Content: Thought content normal.       Results for orders placed or performed in visit on 02/01/22  Vitamin B12  Result Value Ref Range   Vitamin B-12 355 211 - 911 pg/mL  VITAMIN D 25 Hydroxy (Vit-D Deficiency, Fractures)  Result Value Ref Range   VITD 47.61 30.00 - 100.00 ng/mL     COVID 19 screen:  No recent travel or known exposure to COVID19 The patient denies respiratory symptoms of COVID 19 at this time. The importance of social distancing was discussed today.   Assessment and Plan    Problem List Items Addressed This Visit     Acute on chronic diastolic CHF (congestive heart failure) (HCC) (Chronic)    No acute changes today.  No fluid overload.      B12 deficiency (Chronic)    Chronic, well controlled on supplementation.      Hypertension associated with diabetes (HCC) (Chronic)    Chronic, above goal today in office.  On recheck lower at 140/92.  He will continue the Coreg 6.25 mg daily and the losartan 50 mg p.o. daily. We will reevaluate at the next office visit as he has no home cuff.      Vitamin D deficiency  (Chronic)    Chronic, well controlled on supplementation      Chronic midline low back pain   Diabetes mellitus type 2 with retinopathy (HCC) - Primary    Chronic, slightly worsened A1c but patient has been having issues with hypoglycemia and was on lower doses of medication for a while.  He seems to be doing better back on his usual regimen.  He is continuing to lose weight on the dulaglutide 4.5 mg weekly.  Given occasional low blood sugars later in the day I encouraged him to consider holding the p.m. dose of aspart.  I do not want him to be chronically eating to keep his blood sugar up.      Relevant Orders   POCT HgB A1C (Completed)   Encounter for chronic pain management    Secondary to chronic back pain.  Encouraged him to use additional dose of Tylenol midday with maximum Tylenol use less than 4000 mg in a 24-hour period instead of increasing hydrocodone.  We will continue the hydrocodone twice daily.  Prescriptions for the next 3 months were completed.  He will follow-up in 3 months for chronic pain management.           Kerby Nora, MD

## 2022-02-08 NOTE — Assessment & Plan Note (Signed)
No acute changes today.  No fluid overload.

## 2022-02-09 ENCOUNTER — Encounter: Payer: Self-pay | Admitting: Pharmacist

## 2022-02-09 NOTE — Progress Notes (Signed)
[facility])                                            [facility] Quality Pharmacy Team                                        Statin Quality Measure Assessment    02/09/2022  [name] 01-08-1951 [id number]   Per review of chart and payor information, patient has a diagnosis of diabetes but is not currently filling a statin prescription.  This places patient into the SUPD (Statin Use In Patients with Diabetes) measure for CMS.    Patient has documented trials of atorvastatin and rosuvastatin with reported body aches, but no corresponding CPT codes that would exclude patient from SUPD measure.  The ASCVD Risk score (Arnett DK, et al., 2019) failed to calculate for the following reasons:   The valid total cholesterol range is 130 to 320 mg/dL 04/02/4626     Component Value Date/Time   CHOL 107 11/02/2020 0842   TRIG 82.0 11/02/2020 0842   HDL 34.30 (L) 11/02/2020 0842   CHOLHDL 3 11/02/2020 0842   VLDL 16.4 11/02/2020 0842   LDLCALC 56 11/02/2020 0842   LDLDIRECT 174.0 10/19/2017 1600    Please consider ONE of the following recommendations:  Initiate high intensity statin Atorvastatin 40mg  once daily, #90, 3 refills   Rosuvastatin 20mg  once daily, #90, 3 refills    Initiate moderate intensity          statin with reduced frequency if prior          statin intolerance 1x weekly, #13, 3 refills   2x weekly, #26, 3 refills   3x weekly, #39, 3 refills    Code for past statin intolerance or  other exclusions (required annually)  Provider Requirements: Associate code during an office visit or telehealth encounter  Drug Induced Myopathy G72.0   Myopathy, unspecified G72.9   Myositis, unspecified M60.9   Rhabdomyolysis M62.82   Cirrhosis of liver K74.69   Prediabetes R73.03   PCOS E28.2    , PharmD [facility]  [facility] Clinical Pharmacist Office: [phone]

## 2022-02-14 NOTE — Progress Notes (Unsigned)
Date:  02/15/2022   ID:  [name], DOB Jun 29, 1951, MRN [id number]  Patient Location:  [address] [place] [address]   Provider location:   [facility], [place] office  PCP:  [name], MD  Cardiologist:  Fonnie Mu  Chief Complaint  Patient presents with   12 month follow up     Patient c/o shortness of breath with little to no exertion. Medications reviewed by the patient verbally.      History of Present Illness:    [name] is a 71 y.o. male past medical history of morbid obesity,  HTN,  DM,  CAD  NSTEMI in 11/2015 (PCI to LAD; was discharged on Plavix) and again 02/2016 (no PCI; was discharged on Brilinta), Again presented to [facility] ER on 04/05/16 for NSTEMI (no PCI),  catheterization 04/06/2016, stable LAD stent, occluded RCA with collaterals Chronic total occlusion of RCA EF 55 to 60% Chronic chest pain syndrome GERD Who presents for follow-up of his coronary artery disease  Last office visit with myself June 2022 Tired, poor sleep, reports that he watches TV shows at 3 or 4 in the morning Mows 10 yards in his free time does 2 yards a day sometimes 3 early in the morning  Diabetic eye disease, lost 80% of his vision Can still drive but only in the daylight, cannot drive when it is raining or nighttime  Wife died >3 years ago, kidney failure Lives on his own, has large extended family Does his own chores Denies significant leg swelling, Lasix daily  Lab work reviewed A1c stable 7.5 Total cholesterol 107 LDL 56 Hemoglobin 12 CR 1.2  EKG personally reviewed by myself on todays visit Shows normal sinus rhythm rate 79 bpm consider old inferior MI  Long history of atypical chest pain, Numerous cardiac catheterizations including July 2017, August 2017, treated medically    Prior catheterization Chronic total occlusion of the right coronary in the proximal segment well collateralized from the left  circumflex and LAD. Widely patent proximal to mid LAD stent previously placed in July. There is first diagonal diagonal and LAD 30 and 50% narrowing respectively. Widely patent circumflex unchanged from previous with 70% narrowing in a small branch of the first marginal. Circumflex collateralizes the distal right coronary left ventricular branch. Inferobasal hypokinesis. EF 50%. EDP is normal. Unable to identify a significant change in the angiographic appearance since prior study in July.   Past Medical History:  Diagnosis Date   Back injury 02/2002   worker's comp   CHF (congestive heart failure) (HCC)    Coronary artery disease, non-occlusive    a. cath 2002 with no sig CAD;  b. cath 2008 normal LM, LAD, LCx, p&dRCA 20-30%, PDA 30%; c.11/2015 NSTEMI/PCI: LM nl, LAD 95p (2.5x15 Xience DES), LCX nl, RCA 100p/m w/ L->R collats, EF 55-65% c. NSTEMI (02/2016) with no culprit leision, switched to Brilinta.  d. NSTEMI 03/2016: again, no culprit lesion and switched back to plavix 2/2 SOB with Brilnta.     Depression    Diabetes mellitus type 2, insulin dependent (HCC)    Hyperlipemia    Hypertensive heart disease    Kidney stones    Morbid obesity (HCC)    Osteoarthritis    Snoring    Past Surgical History:  Procedure Laterality Date   CARDIAC CATHETERIZATION  09/29/2000   diffuse LAD 30% LCA  EF 50-60%   CARDIAC CATHETERIZATION  06/30/2007   no  significant CAD   CARDIAC CATHETERIZATION N/A 12/25/2015   Procedure: Left Heart Cath and Coronary Angiography;  Surgeon: Antonieta Iba, MD;  Location: [facility] INVASIVE CV LAB;  Service: Cardiovascular;  Laterality: N/A;   CARDIAC CATHETERIZATION N/A 12/25/2015   Procedure: Coronary Stent Intervention;  Surgeon: Alwyn Pea, MD;  Location: [facility] INVASIVE CV LAB;  Service: Cardiovascular;  Laterality: N/A;   CARDIAC CATHETERIZATION N/A 03/10/2016   Procedure: Left Heart Cath and Coronary Angiography;  Surgeon: Iran Ouch, MD;  Location:  [facility] INVASIVE CV LAB;  Service: Cardiovascular;  Laterality: N/A;   CARDIAC CATHETERIZATION N/A 04/06/2016   Procedure: Left Heart Cath and Coronary Angiography;  Surgeon: Lyn Records, MD;  Location: [facility] INVASIVE CV LAB;  Service: Cardiovascular;  Laterality: N/A;   CIRCUMCISION     CORONARY ARTERY BYPASS GRAFT       Current Meds  Medication Sig   aspirin 81 MG EC tablet Take 1 tablet (81 mg total) by mouth daily.   buPROPion (WELLBUTRIN XL) 300 MG 24 hr tablet Take 1 tablet (300 mg total) by mouth daily.   carvedilol (COREG) 6.25 MG tablet TAKE 1 TABLET BY MOUTH ONCE DAILY   cholecalciferol (VITAMIN D3) 25 MCG (1000 UNIT) tablet Take 2,000 Units by mouth daily.   clopidogrel (PLAVIX) 75 MG tablet TAKE 1 TABLET BY MOUTH ONCE DAILY WITH BREAKFAST   Continuous Blood Gluc Receiver (FREESTYLE LIBRE 2 READER) DEVI Use with sensors to monitor sugar continuously   Continuous Blood Gluc Sensor (FREESTYLE LIBRE 2 SENSOR) MISC Apply sensor every 14 days to monitor sugar continously   Dulaglutide 4.5 MG/0.5ML SOPN Inject 4.5 mg into the skin once a week. Inject 4.5 mg once weekly   ezetimibe (ZETIA) 10 MG tablet TAKE 1 TABLET BY MOUTH ONCE DAILY   furosemide (LASIX) 40 MG tablet TAKE 1 TABLET BY MOUTH DAILY CAN TAKE A SECOND DAILY DOSE AS NEEDED   HYDROcodone-acetaminophen (NORCO) 5-325 MG tablet Take 1 tablet by mouth 2 (two) times daily as needed for moderate pain.   hydrOXYzine (ATARAX) 10 MG tablet TAKE 1 TABLET BY MOUTH 3 TIMES DAILY AS NEEDED FOR ANXIETY   Insulin Aspart FlexPen 100 UNIT/ML SOPN Inject 13 units in the morning and 8 units in the evening (Patient taking differently: Inject 13 units in the morning and 5 units in the evening)   isosorbide mononitrate (IMDUR) 30 MG 24 hr tablet TAKE 1 TABLET BY MOUTH TWICE DAILY   losartan (COZAAR) 50 MG tablet TAKE 1 TABLET BY MOUTH ONCE DAILY   nitroGLYCERIN (NITROSTAT) 0.4 MG SL tablet DISSOLVE 1 TABLET UNDER TONGUE AS NEEDEDFOR CHEST PAIN. MAY  REPEAT 5 MINUTES APART 3 TIMES IF NEEDED   TRESIBA FLEXTOUCH 100 UNIT/ML FlexTouch Pen INJECT 60 UNITS INTO THE SKIN DAILY   TRUEPLUS 5-BEVEL PEN NEEDLES 31G X 6 MM MISC USE TO INJECT INSULIN 3 TIMES A DAY   venlafaxine XR (EFFEXOR-XR) 150 MG 24 hr capsule TAKE 1 CAPSULE BY MOUTH DAILY WITH BREAKFAST. TAKE WITH EFFEXOR XR 75MG  FORA TOTAL OF 225MG    venlafaxine XR (EFFEXOR-XR) 75 MG 24 hr capsule TAKE 1 CAPSULE BY MOUTH ONCE DAILY. TAKEIN ADDITION TO THE 150 MG CAPSULE FOR A TOTAL DOSE OF 225MG  DAILY   vitamin B-12 (CYANOCOBALAMIN) 1000 MCG tablet Take 1,000 mcg by mouth daily.     Allergies:   Atorvastatin   Social History   Tobacco Use   Smoking status: Never   Smokeless tobacco: Never  Vaping Use   Vaping Use:  Never used  Substance Use Topics   Alcohol use: No    Alcohol/week: 0.0 standard drinks of alcohol   Drug use: No     Family Hx: [name] family history includes Alzheimer's disease in his mother; Cancer in his brother; Diabetes in his father; Emphysema in his mother; Heart disease in his father.  ROS:   Please see the history of present illness.    Review of Systems  Constitutional: Negative.   Respiratory: Negative.    Cardiovascular: Negative.   Gastrointestinal: Negative.   Musculoskeletal: Negative.   Neurological: Negative.   Psychiatric/Behavioral: Negative.    All other systems reviewed and are negative.     Labs/Other Tests and Data Reviewed:    Recent Labs: 09/15/2021: BUN 20; Creatinine, Ser 1.20; Hemoglobin 12.1; Platelets 251; Potassium 3.7; Sodium 136   Recent Lipid Panel Lab Results  Component Value Date/Time   CHOL 107 11/02/2020 08:42 AM   TRIG 82.0 11/02/2020 08:42 AM   HDL 34.30 (L) 11/02/2020 08:42 AM   CHOLHDL 3 11/02/2020 08:42 AM   LDLCALC 56 11/02/2020 08:42 AM   LDLDIRECT 174.0 10/19/2017 04:00 PM    Wt Readings from Last 3 Encounters:  02/15/22 274 lb 2 oz (124.3 kg)  02/08/22 275 lb (124.7 kg)  12/23/21 279 lb 12.8 oz  (126.9 kg)     Exam:   BP 140/60 (BP Location: Left Arm, Patient Position: Sitting, Cuff Size: Large)   Pulse 79   Ht 5\' 6"  (1.676 m)   Wt 274 lb 2 oz (124.3 kg)   SpO2 96%   BMI 44.24 kg/m  Constitutional:  oriented to person, place, and time. No distress.  HENT:  Head: Grossly normal Eyes:  no discharge. No scleral icterus.  Neck: No JVD, no carotid bruits  Cardiovascular: Regular rate and rhythm, no murmurs appreciated Pulmonary/Chest: Clear to auscultation bilaterally, no wheezes or rails Abdominal: Soft.  no distension.  no tenderness.  Musculoskeletal: Normal range of motion Neurological:  normal muscle tone. Coordination normal. No atrophy Skin: Skin warm and dry Psychiatric: normal affect, pleasant   ASSESSMENT & PLAN:    Coronary artery disease of native artery of native heart with stable angina pectoris (HCC) Denies anginal symptoms, discussed whether to hold his clopidogrel, he prefers to continue for now Cholesterol at goal  Morbid obesity (HCC) We have encouraged continued exercise, careful diet management in an effort to lose weight.  Hyperlipidemia LDL goal <70 Cholesterol is at goal on the current lipid regimen. No changes to the medications were made.  Essential hypertension, benign Blood pressure is well controlled on today's visit. No changes made to the medications.  DM (diabetes mellitus), secondary, uncontrolled, w/eye complications (HCC) A1c stable 7.5  GERD: Stable, takes PPI   Total encounter time more than 30 minutes  Greater than 50% was spent in counseling and coordination of care with the patient    Signed, , MD  02/15/2022 2:23 PM    [facility] [place] [address], [place], [place]

## 2022-02-15 ENCOUNTER — Ambulatory Visit (INDEPENDENT_AMBULATORY_CARE_PROVIDER_SITE_OTHER): Payer: HMO | Admitting: Cardiovascular Disease

## 2022-02-15 ENCOUNTER — Encounter: Payer: Self-pay | Admitting: Cardiovascular Disease

## 2022-02-15 ENCOUNTER — Other Ambulatory Visit: Payer: Self-pay | Admitting: Family Medicine

## 2022-02-15 VITALS — BP 140/60 | HR 79 | Ht 66.0 in | Wt 274.1 lb

## 2022-02-15 DIAGNOSIS — T466X5A Adverse effect of antihyperlipidemic and antiarteriosclerotic drugs, initial encounter: Secondary | ICD-10-CM

## 2022-02-15 DIAGNOSIS — I152 Hypertension secondary to endocrine disorders: Secondary | ICD-10-CM | POA: Diagnosis not present

## 2022-02-15 DIAGNOSIS — I25118 Atherosclerotic heart disease of native coronary artery with other forms of angina pectoris: Secondary | ICD-10-CM

## 2022-02-15 DIAGNOSIS — E1169 Type 2 diabetes mellitus with other specified complication: Secondary | ICD-10-CM | POA: Diagnosis not present

## 2022-02-15 DIAGNOSIS — E785 Hyperlipidemia, unspecified: Secondary | ICD-10-CM

## 2022-02-15 DIAGNOSIS — M791 Myalgia, unspecified site: Secondary | ICD-10-CM

## 2022-02-15 DIAGNOSIS — I5033 Acute on chronic diastolic (congestive) heart failure: Secondary | ICD-10-CM

## 2022-02-15 DIAGNOSIS — I110 Hypertensive heart disease with heart failure: Secondary | ICD-10-CM

## 2022-02-15 DIAGNOSIS — E1159 Type 2 diabetes mellitus with other circulatory complications: Secondary | ICD-10-CM

## 2022-02-15 NOTE — Telephone Encounter (Signed)
Last office visit 02/08/22 for chronic pain management and DM.  I see a refill that states to fill on 03/10/22 and 04/10/22 but not one for June.  I called and spoke with pharmacist at [facility] and she confirmed that is what they have on file as well.

## 2022-02-15 NOTE — Patient Instructions (Signed)
Medication Instructions:  No changes  If you need a refill on your cardiac medications before your next appointment, please call your pharmacy.   Lab work: No new labs needed  Testing/Procedures: No new testing needed  Follow-Up: At [facility], you and your health needs are our priority.  As part of our continuing mission to provide you with exceptional heart care, we have created designated Provider Care Teams.  These Care Teams include your primary Cardiologist (physician) and Advanced Practice Providers (APPs -  Physician Assistants and Nurse Practitioners) who all work together to provide you with the care you need, when you need it.  You will need a follow up appointment in 12 months  Providers on your designated Care Team:   Christopher [name], NP Ryan Dunn, PA-C Cadence Furth, PA-C  COVID-19 Vaccine Information can be found at: [email or web address] For questions related to vaccine distribution or appointments, please email [email or web address] or call [phone].   

## 2022-03-02 DIAGNOSIS — E113393 Type 2 diabetes mellitus with moderate nonproliferative diabetic retinopathy without macular edema, bilateral: Secondary | ICD-10-CM | POA: Diagnosis not present

## 2022-03-02 DIAGNOSIS — H35372 Puckering of macula, left eye: Secondary | ICD-10-CM | POA: Diagnosis not present

## 2022-03-02 DIAGNOSIS — H34813 Central retinal vein occlusion, bilateral, with macular edema: Secondary | ICD-10-CM | POA: Diagnosis not present

## 2022-03-02 DIAGNOSIS — H34811 Central retinal vein occlusion, right eye, with macular edema: Secondary | ICD-10-CM | POA: Diagnosis not present

## 2022-03-02 DIAGNOSIS — H3582 Retinal ischemia: Secondary | ICD-10-CM | POA: Diagnosis not present

## 2022-03-03 ENCOUNTER — Other Ambulatory Visit: Payer: Self-pay | Admitting: Cardiovascular Disease

## 2022-03-18 ENCOUNTER — Telehealth: Payer: Self-pay

## 2022-03-18 NOTE — Chronic Care Management (AMB) (Signed)
Chronic Care Management Pharmacy Assistant   Name: [name]  MRN: [id number] DOB: May 01, 1951   Reason for Encounter: Reminder Call   Conditions to be addressed/monitored: CAD, HTN, HLD, and DMII   Medications: Outpatient Encounter Medications as of 03/18/2022  Medication Sig   aspirin 81 MG EC tablet Take 1 tablet (81 mg total) by mouth daily.   buPROPion (WELLBUTRIN XL) 300 MG 24 hr tablet Take 1 tablet (300 mg total) by mouth daily.   carvedilol (COREG) 6.25 MG tablet TAKE 1 TABLET BY MOUTH ONCE DAILY   cholecalciferol (VITAMIN D3) 25 MCG (1000 UNIT) tablet Take 2,000 Units by mouth daily.   clopidogrel (PLAVIX) 75 MG tablet TAKE 1 TABLET BY MOUTH ONCE DAILY WITH BREAKFAST   Continuous Blood Gluc Receiver (FREESTYLE LIBRE 2 READER) DEVI Use with sensors to monitor sugar continuously   Continuous Blood Gluc Sensor (FREESTYLE LIBRE 2 SENSOR) MISC Apply sensor every 14 days to monitor sugar continously   Dulaglutide 4.5 MG/0.5ML SOPN Inject 4.5 mg into the skin once a week. Inject 4.5 mg once weekly   ezetimibe (ZETIA) 10 MG tablet TAKE 1 TABLET BY MOUTH ONCE DAILY   furosemide (LASIX) 40 MG tablet TAKE 1 TABLET BY MOUTH DAILY CAN TAKE A SECOND DAILY DOSE AS NEEDED   HYDROcodone-acetaminophen (NORCO) 5-325 MG tablet Take 1 tablet by mouth 2 (two) times daily as needed for moderate pain.   HYDROcodone-acetaminophen (NORCO/VICODIN) 5-325 MG tablet TAKE 1 TABLET BY MOUTH TWICE A DAY AS NEEDED FOR MODERATE PAIN (Patient not taking: Reported on 02/15/2022)   HYDROcodone-acetaminophen (NORCO/VICODIN) 5-325 MG tablet TAKE 1 TABLET BY MOUTH TWICE (2) DAILY AS NEEDED FOR MODERATE PAIN   hydrOXYzine (ATARAX) 10 MG tablet TAKE 1 TABLET BY MOUTH 3 TIMES DAILY AS NEEDED FOR ANXIETY   Insulin Aspart FlexPen 100 UNIT/ML SOPN Inject 13 units in the morning and 8 units in the evening (Patient taking differently: Inject 13 units in the morning and 5 units in the evening)   isosorbide mononitrate  (IMDUR) 30 MG 24 hr tablet TAKE 1 TABLET BY MOUTH TWICE DAILY   losartan (COZAAR) 50 MG tablet TAKE 1 TABLET BY MOUTH ONCE DAILY   nitroGLYCERIN (NITROSTAT) 0.4 MG SL tablet DISSOLVE 1 TABLET UNDER TONGUE AS NEEDEDFOR CHEST PAIN. MAY REPEAT 5 MINUTES APART 3 TIMES IF NEEDED   TRESIBA FLEXTOUCH 100 UNIT/ML FlexTouch Pen INJECT 60 UNITS INTO THE SKIN DAILY   TRUEPLUS 5-BEVEL PEN NEEDLES 31G X 6 MM MISC USE TO INJECT INSULIN 3 TIMES A DAY   venlafaxine XR (EFFEXOR-XR) 150 MG 24 hr capsule TAKE 1 CAPSULE BY MOUTH DAILY WITH BREAKFAST. TAKE WITH EFFEXOR XR 75MG  FORA TOTAL OF 225MG    venlafaxine XR (EFFEXOR-XR) 75 MG 24 hr capsule TAKE 1 CAPSULE BY MOUTH ONCE DAILY. TAKEIN ADDITION TO THE 150 MG CAPSULE FOR A TOTAL DOSE OF 225MG  DAILY   vitamin B-12 (CYANOCOBALAMIN) 1000 MCG tablet Take 1,000 mcg by mouth daily.   No facility-administered encounter medications on file as of 03/18/2022.   was contacted to remind of upcoming telephone visit with  on 03/23/22 at 8:45am. Patient was reminded to have any blood glucose and blood pressure readings available for review at appointment.   Patient confirmed appointment.   Are you having any problems with your medications? No  the patient reports he upgraded to [facility] 2  Do you have any concerns you like to discuss with the pharmacist? No   CCM referral  has been placed prior to visit?  Yes    Star Rating Drugs: Medication:  Last Fill: Day Supply [employer]   Manufacturer   Losartan 50mg  01/04/22  90 [address]  Novolog    The patient uses 03/06/22 and is pleased with the service from them, He states he does not miss any doses of medications and if he is unable to get to the pharmacy, they will deliver to him .   [employer], CPP notified  Al Corpus, [facility] concierge  [phone]

## 2022-03-23 ENCOUNTER — Ambulatory Visit (INDEPENDENT_AMBULATORY_CARE_PROVIDER_SITE_OTHER): Payer: PPO | Admitting: Pharmacist

## 2022-03-23 DIAGNOSIS — E11319 Type 2 diabetes mellitus with unspecified diabetic retinopathy without macular edema: Secondary | ICD-10-CM

## 2022-03-23 DIAGNOSIS — I152 Hypertension secondary to endocrine disorders: Secondary | ICD-10-CM

## 2022-03-23 DIAGNOSIS — E1169 Type 2 diabetes mellitus with other specified complication: Secondary | ICD-10-CM

## 2022-03-23 DIAGNOSIS — I25118 Atherosclerotic heart disease of native coronary artery with other forms of angina pectoris: Secondary | ICD-10-CM

## 2022-03-23 DIAGNOSIS — F331 Major depressive disorder, recurrent, moderate: Secondary | ICD-10-CM

## 2022-03-23 NOTE — Progress Notes (Signed)
Chronic Care Management Pharmacy Note  03/23/2022 Name:  [name] MRN:  [id number] DOB:  1950-11-09  Summary: CCM F/U visit -Reviewed medications; pt reports he stopped taking bupropion 4 days ago due to presumed side effects - he was feeling fatigued, unable to get out of bed, nausea, vomiting, loss of appetite. Discussed it is unlikely these symptoms are truly side effects of bupropion since he was tolerating it fine for several months prior; nonetheless pt reports symptoms have resolved off of medication, it is reasonable to continue off medication for now -DM: A1c 7.5% (01/2022) slightly worse after insulin changes due to hypoglycemia; currently patient reports fasting BG typically 90-140, few readings > 240 per [facility]. He denies recent hypoglycemia since he has mostly stopped using Novolog at night -HLD/CAD: most recent LDL was 56 (10/2020) while patient was taking rosuvastatin, which he stopped in July 2022; he continues ezetimibe  Recommendations/Changes made from today's visit: -Advised pt to always call PCP for stopping/changing medication; removed bupropion from med list and advised pt to monitor for mood changes -Continue current insulin regimen; repeat A1c at upcoming PCP visit -Recommend repeat lipid panel at upcoming PCP visit  Plan: -[place] will call patient 1 month for DM update -PCP visit 05/12/22 -Pharmacist follow up televisit scheduled for 3 months     Subjective: [name] is an 71 y.o. year old male who is a primary patient of Bedsole, Amy E, MD.  The CCM team was consulted for assistance with disease management and care coordination needs.    Engaged with patient by telephone for follow up visit in response to provider referral for pharmacy case management and/or care coordination services.   Consent to Services:  The patient was given information about Chronic Care Management services, agreed to services, and gave verbal consent  prior to initiation of services.  Please see initial visit note for detailed documentation.   Patient Care Team: Jinny Sanders, MD as PCP - General Rockey Situ Kathlene November, MD as Consulting Physician (Cardiology) Charlton Haws, [facility] as Pharmacist (Pharmacist)  Recent office visits: 02/08/22 Dr Diona Browner OV: f/u DM - A1c 7.5%; consider holding PM dose of Novolog. Try 2 Tylenol middayu for breakthrough pain.  12/23/21 Dr Diona Browner OV: f/u DM - BG in 63s. Hold Novolog. Decrease Tresiba to 30 units.  11/16/21 Dr Diona Browner OV: Annual exam - DM foot exam done. Started bupropion 150 mg. F/U 4 weeks for PHQ and GAD.  11/05/21 Dr Diona Browner OV: f/u DM. Needs Trulicity PAP renewed.  Recent consult visits: 02/15/22 Dr Rockey Situ (Cardiology): f/u CHF, CAD. No changes. 02/17/21 Dr Rockey Situ (Cardiology): f/u CHF, CAD. No changes  Hospital visits: 09/15/21 ED Visit - chest pain. Resolved with NTG x 1 and extra lasix. EKG reassuring. Discharged home, encouraged to f/u with cardiology.   Objective:  Lab Results  Component Value Date   CREATININE 1.20 09/15/2021   BUN 20 09/15/2021   GFR 71.68 11/02/2020   GFRNONAA >60 09/15/2021   GFRAA >60 04/02/2020   NA 136 09/15/2021   K 3.7 09/15/2021   CALCIUM 8.6 (L) 09/15/2021   CO2 27 09/15/2021   GLUCOSE 300 (H) 09/15/2021    Lab Results  Component Value Date/Time   HGBA1C 7.5 (A) 02/08/2022 10:52 AM   HGBA1C 7.1 (A) 11/05/2021 02:47 PM   HGBA1C 7.7 (H) 11/02/2020 08:42 AM   HGBA1C 8.6 (H) 07/31/2020 08:41 AM   HGBA1C 9.6 10/02/2018 12:00 AM   GFR 71.68 11/02/2020 08:42 AM  GFR 76.14 07/31/2020 08:41 AM   MICROALBUR <0.7 02/21/2017 01:57 PM   MICROALBUR 0.8 02/16/2016 08:24 AM    Last diabetic Eye exam:  Lab Results  Component Value Date/Time   HMDIABEYEEXA Retinopathy (A) 01/26/2021 12:00 AM    Last diabetic Foot exam:  Lab Results  Component Value Date/Time   HMDIABFOOTEX done 11/01/2021 12:00 AM     Lab Results  Component Value Date   CHOL  107 11/02/2020   HDL 34.30 (L) 11/02/2020   LDLCALC 56 11/02/2020   LDLDIRECT 174.0 10/19/2017   TRIG 82.0 11/02/2020   CHOLHDL 3 11/02/2020       Latest Ref Rng & Units 11/02/2020    8:42 AM 07/31/2020    8:41 AM 04/14/2020    8:34 AM  Hepatic Function  Total Protein 6.0 - 8.3 g/dL 7.4  6.7  7.2   Albumin 3.5 - 5.2 g/dL 3.9  3.9  4.0   AST 0 - 37 U/L 18  12  16    ALT 0 - 53 U/L 14  12  19    Alk Phosphatase 39 - 117 U/L 77  85  85   Total Bilirubin 0.2 - 1.2 mg/dL 0.4  0.4  0.3     Lab Results  Component Value Date/Time   TSH 2.64 10/19/2017 04:00 PM   TSH 1.14 03/11/2014 10:26 AM   FREET4 0.79 10/19/2017 04:00 PM       Latest Ref Rng & Units 09/15/2021    3:04 PM 07/31/2020    8:41 AM 03/31/2020    4:41 PM  CBC  WBC 4.0 - 10.5 K/uL 6.6  6.6  7.0   Hemoglobin 13.0 - 17.0 g/dL 12.1  13.2  13.4   Hematocrit 39.0 - 52.0 % 37.7  40.2  41.0   Platelets 150 - 400 K/uL 251  198.0  224     Lab Results  Component Value Date/Time   VD25OH 47.61 02/01/2022 07:52 AM   VD25OH 37.72 07/31/2020 08:41 AM    Clinical ASCVD: No  The ASCVD Risk score (Arnett DK, et al., 2019) failed to calculate for the following reasons:   The valid total cholesterol range is 130 to 320 mg/dL       11/16/2021    9:34 AM 11/11/2021    2:02 PM 11/05/2021    2:36 PM  Depression screen PHQ 2/9  Decreased Interest 2 0 0  Down, Depressed, Hopeless 3 0 2  PHQ - 2 Score 5 0 2  Altered sleeping 1  3  Tired, decreased energy 3  3  Change in appetite 0  0  Feeling bad or failure about yourself  3  2  Trouble concentrating 3  3  Moving slowly or fidgety/restless 2  0  Suicidal thoughts 0  0  PHQ-9 Score 17  13  Difficult doing work/chores Extremely dIfficult          11/16/2021    9:37 AM 11/05/2021    2:37 PM  GAD 7 : Generalized Anxiety Score  Nervous, Anxious, on Edge 2 3  Control/stop worrying 2 3  Worry too much - different things 3 0  Trouble relaxing 3 3  Restless 1 3  Easily annoyed or  irritable 2 0  Afraid - awful might happen 1 0  Total GAD 7 Score 14 12  Anxiety Difficulty Extremely difficult      Social History   Tobacco Use  Smoking Status Never  Smokeless Tobacco Never   BP Readings  from Last 3 Encounters:  02/15/22 140/60  02/08/22 (!) 142/90  12/23/21 128/78   Pulse Readings from Last 3 Encounters:  02/15/22 79  02/08/22 80  12/23/21 74   Wt Readings from Last 3 Encounters:  02/15/22 274 lb 2 oz (124.3 kg)  02/08/22 275 lb (124.7 kg)  12/23/21 279 lb 12.8 oz (126.9 kg)   BMI Readings from Last 3 Encounters:  02/15/22 44.24 kg/m  02/08/22 44.39 kg/m  12/23/21 45.16 kg/m    Assessment/Interventions: Review of patient past medical history, allergies, medications, health status, including review of consultants reports, laboratory and other test data, was performed as part of comprehensive evaluation and provision of chronic care management services.   SDOH:  (Social Determinants of Health) assessments and interventions performed: No  SDOH Screenings   Alcohol Screen: Low Risk  (11/11/2021)   Alcohol Screen    Last Alcohol Screening Score (AUDIT): 0  Depression (PHQ2-9): Medium Risk (11/16/2021)   Depression (PHQ2-9)    PHQ-2 Score: 17  Financial Resource Strain: Low Risk  (11/11/2021)   Overall Financial Resource Strain (CARDIA)    Difficulty of Paying Living Expenses: Not very hard  Food Insecurity: No Food Insecurity (11/11/2021)   Hunger Vital Sign    Worried About Running Out of Food in the Last Year: Never true    Ran Out of Food in the Last Year: Never true  Housing: Low Risk  (11/11/2021)   Housing    Last Housing Risk Score: 0  Physical Activity: Inactive (11/11/2021)   Exercise Vital Sign    Days of Exercise per Week: 0 days    Minutes of Exercise per Session: 0 min  Social Connections: Moderately Isolated (11/11/2021)   Social Connection and Isolation Panel [NHANES]    Frequency of Communication with Friends and Family: More  than three times a week    Frequency of Social Gatherings with Friends and Family: More than three times a week    Attends Religious Services: More than 4 times per year    Active Member of [employer] or Organizations: No    Attends Archivist Meetings: Never    Marital Status: Widowed  Stress: No Stress Concern Present (11/11/2021)   [place]    Feeling of Stress : Only a little  Tobacco Use: Low Risk  (02/15/2022)   Patient History    Smoking Tobacco Use: Never    Smokeless Tobacco Use: Never    Passive Exposure: Not on file  Transportation Needs: No Transportation Needs (11/11/2021)   PRAPARE - Transportation    Lack of Transportation (Medical): No    Lack of Transportation (Non-Medical): No    CCM Care Plan  Allergies  Allergen Reactions   Atorvastatin Other (See Comments)    Body aches Similar effect with rosuvastatin 40 mg twice weekly    Medications Reviewed Today     Reviewed by Charlton Haws, [facility] (Pharmacist) on 03/23/22 at [place] List Status: <None>   Medication Order Taking? Sig Documenting Provider Last Dose Status Informant  aspirin 81 MG EC tablet [id number] Yes Take 1 tablet (81 mg total) by mouth daily. Theora Gianotti, NP Taking Active Self           Med Note Pilar Plate May 01, 2016  3:28 PM)    carvedilol (COREG) 6.25 MG tablet [id number] Yes TAKE 1 TABLET BY MOUTH ONCE DAILY Gollan, Kathlene November, MD Taking Active  cholecalciferol (VITAMIN D3) 25 MCG (1000 UNIT) tablet [id number] Yes Take 2,000 Units by mouth daily. [provider] Taking Active   clopidogrel (PLAVIX) 75 MG tablet [id number] Yes TAKE 1 TABLET BY MOUTH ONCE DAILY WITH BREAKFAST Bedsole, Amy E, MD Taking Active   Continuous Blood Gluc Receiver (FREESTYLE LIBRE 2 READER) DEVI [id number] Yes Use with sensors to monitor sugar continuously Diona Browner, Amy E, MD Taking Active   Continuous Blood  Gluc Sensor (FREESTYLE LIBRE 2 SENSOR) MISC [id number] Yes Apply sensor every 14 days to monitor sugar continously Bedsole, Amy E, MD Taking Active   Dulaglutide 4.5 MG/0.5ML SOPN [id number] Yes Inject 4.5 mg into the skin once a week. Inject 4.5 mg once weekly Bedsole, Amy E, MD Taking Active   ezetimibe (ZETIA) 10 MG tablet [id number] Yes TAKE 1 TABLET BY MOUTH ONCE DAILY Gollan, Kathlene November, MD Taking Active   furosemide (LASIX) 40 MG tablet [id number] Yes TAKE 1 TABLET BY MOUTH DAILY CAN TAKE A SECOND DAILY DOSE AS NEEDED Bedsole, Amy E, MD Taking Active   HYDROcodone-acetaminophen (NORCO) 5-325 MG tablet [id number] Yes Take 1 tablet by mouth 2 (two) times daily as needed for moderate pain. Jinny Sanders, MD Taking Active   HYDROcodone-acetaminophen (NORCO/VICODIN) 5-325 MG tablet [id number] Yes TAKE 1 TABLET BY MOUTH TWICE A DAY AS NEEDED FOR MODERATE PAIN Bedsole, Amy E, MD Taking Active   HYDROcodone-acetaminophen (NORCO/VICODIN) 5-325 MG tablet [id number] Yes TAKE 1 TABLET BY MOUTH TWICE (2) DAILY AS NEEDED FOR MODERATE PAIN Bedsole, Amy E, MD Taking Active   hydrOXYzine (ATARAX) 10 MG tablet [id number] Yes TAKE 1 TABLET BY MOUTH 3 TIMES DAILY AS NEEDED FOR ANXIETY Bedsole, Amy E, MD Taking Active   Insulin Aspart FlexPen 100 UNIT/ML SOPN [id number] Yes Inject 13 units in the morning and 8 units in the evening  Patient taking differently: 13 units in AM (scheduled) and 3 units PRN in evening   Bedsole, Amy E, MD Taking Active   isosorbide mononitrate (IMDUR) 30 MG 24 hr tablet [id number] Yes TAKE 1 TABLET BY MOUTH TWICE DAILY Gollan, Kathlene November, MD Taking Active   losartan (COZAAR) 50 MG tablet [id number] Yes TAKE 1 TABLET BY MOUTH ONCE DAILY Bedsole, Amy E, MD Taking Active   nitroGLYCERIN (NITROSTAT) 0.4 MG SL tablet [id number] Yes DISSOLVE 1 TABLET UNDER TONGUE AS NEEDEDFOR CHEST PAIN. MAY REPEAT 5 MINUTES APART 3 TIMES IF NEEDED Gollan, Kathlene November, MD Taking Active   TRESIBA FLEXTOUCH 100 UNIT/ML  FlexTouch Pen [id number] Yes INJECT 60 UNITS INTO THE SKIN DAILY  Patient taking differently: Inject 50 Units into the skin daily.   Jinny Sanders, MD Taking Active   TRUEPLUS 5-BEVEL PEN NEEDLES 31G X 6 MM MISC [id number] Yes USE TO INJECT INSULIN 3 TIMES A DAY Bedsole, Amy E, MD Taking Active   venlafaxine XR (EFFEXOR-XR) 150 MG 24 hr capsule [id number] Yes TAKE 1 CAPSULE BY MOUTH DAILY WITH BREAKFAST. TAKE WITH EFFEXOR XR 75MG FORA TOTAL OF 225MG Bedsole, Amy E, MD Taking Active   venlafaxine XR (EFFEXOR-XR) 75 MG 24 hr capsule [id number] Yes TAKE 1 CAPSULE BY MOUTH ONCE DAILY. TAKEIN ADDITION TO THE 150 MG CAPSULE FOR A TOTAL DOSE OF 225MG DAILY Bedsole, Amy E, MD Taking Active   vitamin B-12 (CYANOCOBALAMIN) 1000 MCG tablet [id number] Yes Take 1,000 mcg by mouth daily. [provider] Taking Active Self            Patient Active Problem List   Diagnosis Date Noted   GAD (  generalized anxiety disorder) 09/21/2021   Encounter for chronic pain management 05/13/2021   Statin myopathy 03/30/2021   Chronic midline low back pain 11/12/2020   Hyperglycemia due to type 2 diabetes mellitus ([place]) 03/31/2020   ED (erectile dysfunction) 03/06/2018   Acute on chronic diastolic CHF (congestive heart failure) ([place]) 09/20/2016   Adjustment disorder with mixed anxiety and depressed mood 09/09/2016   Gastroesophageal reflux disease 09/09/2016   Elevated left ventricular end-diastolic pressure (LVEDP) 03/11/2016   Anemia 03/11/2016   Hypertensive heart disease with heart failure ([place])    Diabetes mellitus type 2 with retinopathy ([place])    Coronary artery disease of native artery of native heart with stable angina pectoris ([place])    Morbid obesity ([place])    Chronic chest pain    Advanced care planning/counseling discussion 11/20/2015   Diabetic retinopathy ([place]) 09/23/2014   Snoring 12/21/2012   HYPOGONADISM 09/28/2010   B12 deficiency 09/28/2010   Vitamin D deficiency 09/28/2010   OTHER  MALAISE AND FATIGUE 09/17/2010   TRIGGER FINGER, RIGHT MIDDLE 09/14/2010   BRANCH RETINAL VEIN OCCLUSION 11/26/2009   CONSTIPATION 08/10/2009   GASTRITIS 07/29/2009   Major depressive disorder, recurrent episode, moderate ([place]) 07/06/2007   ERECTILE DYSFUNCTION 04/10/2007   RENAL CALCULUS, HX OF 03/26/2007   Hyperlipidemia associated with type 2 diabetes mellitus ([place]) 12/05/2006   Hypertension associated with diabetes ([place]) 12/05/2006   Osteoarthritis 12/05/2006    Immunization History  Administered Date(s) Administered   Fluad Quad(high Dose 65+) 05/23/2020   Influenza Split 05/12/2011, 07/09/2012   Influenza Whole 06/30/2007, 05/29/2008, 07/03/2009   Influenza, High Dose Seasonal PF 05/14/2019   Influenza,inj,Quad PF,6+ Mos 05/31/2013, 07/09/2014, 06/25/2015, 07/05/2016, 06/22/2017, 06/11/2018   PFIZER(Purple Top)SARS-COV-2 Vaccination 10/28/2019, 11/18/2019, 05/29/2020   Pneumococcal Conjugate-13 02/14/2015   Pneumococcal Polysaccharide-23 06/30/2007, 12/21/2012, 02/27/2018   Td 06/30/2007    Conditions to be addressed/monitored:  Hypertension, Hyperlipidemia, Diabetes, Heart Failure, Coronary Artery Disease, and Anxiety  Care Plan : [place]  Updates made by Charlton Haws, [place] since 03/23/2022 12:00 AM     Problem: Hypertension, Hyperlipidemia, Diabetes, Heart Failure, Coronary Artery Disease, and Anxiety   Priority: High     Long-Range Goal: Disease mgmt   Start Date: 12/31/2020  Expected End Date: 12/21/2022  This Visit's Progress: On track  Recent Progress: On track  Priority: High  Note:   Current Barriers:  Due for lipid panel  Pharmacist Clinical Goal(s):  Patient will achieve control of diabetes as evidence by A1c and home BG monitoring through collaboration with PharmD and provider.   Interventions: 1:1 collaboration with Jinny Sanders, MD regarding development and update of comprehensive plan of care as evidenced by provider  attestation and co-signature Inter-disciplinary care team collaboration (see longitudinal plan of care) Comprehensive medication review performed; medication list updated in electronic medical record  Diabetes (A1c goal <7%) -Not ideally controlled - A1c 7.5% (01/2022) somewhat worse after insulin dose changes; he was having issues with hypoglycemia a few months ago but is doing better now -Not usually using Novolog at night anymore -Current home glucose readings - using Elenor Legato (reader) - he is not sure how to navigate reader to show averages, GMI, etc.  Fasting BG: 90-140  Pt says Elenor Legato does not alarm very often (set to alarm for < 70 or > 240) -Current medications: Trulicity 4.5 mg weekly (PAP) - Appropriate, Effective, Safe, Accessible Tresiba - 50u AM -Appropriate, Effective, Safe, Accessible Novolog - 13u AM, 5u PM -Appropriate, Effective, Safe, Accessible Freestyle [employer]  2 - Appropriate, Effective, Safe, Accessible -Medications previously tried:  metformin -Reviewed rule of 15 for hypoglcyemia -Recommend continue current medications;  Hyperlipidemia / CAD (LDL goal < 70) -Query controlled - LDL 56 (10/2020); He stopped rosuvastatin July 2022. This is his second failed statin. He continues Zetia as prescribed. His cholesterol may have improved with weight loss and improved diabetes control this year. -Hx CAD (hx NSTEMI/PCI 2017) -Current treatment: Ezetimibe 10 mg daily -Appropriate, Query Effective Clopidogrel 75 mg daily -Appropriate, Effective, Safe, Accessible Aspirin 81 mg daily -Appropriate, Effective, Safe, Accessible Nitroglycerin 0.4 mg SL prn -Appropriate, Effective, Safe, Accessible -Medications previously tried: rosuvastatin 40 mg 2x weekly, atorvastatin (dose unknown, on allergy list since at least 2009) -Educated on Cholesterol goals;  -Recommended to continue current medication; recommend repeat lipid panel  Hypertension / Heart Failure (BP goal <140/90) -Controlled -  BP at goal in office -Last ejection fraction: 60-65% (Date: 03/2020) -HF type: Diastolic -Current home BP readings: n/a - pt does not have meter -Current treatment: Carvedilol 6.25 mg daily -Appropriate, Effective, Safe, Accessible Furosemide 40 mg daily -Appropriate, Effective, Safe, Accessible Isosorbide MN 30 mg BID -Appropriate, Effective, Safe, Accessible Losartan 50 mg daily -Appropriate, Effective, Safe, Accessible -Medications previously tried: n/a  -Denies hypotensive/hypertensive symptoms -Educated on BP goals and benefits of medications for prevention of heart attack, stroke and kidney damage;Daily salt intake goal < 2300 mg; -Counseled to monitor BP at home periodically -Recommended to continue current medication  Depression/Anxiety (Goal: manage symptoms) -Controlled - pt stopped taking bupropion Sunday - was having nausea, vomiting, constipation, fatigue, lost taste and assumed symptoms were due to bupropion; he reports he does feel better after being off med for 4 days now -PHQ9: 17 (10/2021) - moderately severe depression -GAD7: 14 (10/2021) - moderate anxiety -Connected with PCP for mental health support -Current treatment: Bupropion XL 300 mg daily -stopped taking due to presumed side effects Venlafaxine XR 150 mg daily -Appropriate, Effective, Safe, Accessible Venlafaxine XR 75 mg daily -Appropriate, Effective, Safe, Accessible Hydroxyzine 10 mg TID PRN - not taking -Medications previously tried/failed: n/a -Educated on Benefits of medication for symptom control -Discussed fatigue, loss of taste, vomiting are unlikely to be from bupropion (especially since he started this drug months ago and is just now having issues); it is more likely there was something else causing symptoms (infection, or Trulicity is known to cause some of these side effects); pt insists he feels better now off of bupropion so will continue off this med for now; advised to always contact PCP before  stopping a medication -Recommended to continue current medication  Patient Goals/Self-Care Activities Patient will:  - continue to take medications as prescribed  -Contact PCP before stopping/changing a medication - call if BG < 70     Medication Assistance:  [place] approved through 08/28/22  Compliance/Adherence/Medication fill history: Care Gaps: None  Star-Rating Drugs: Losartan - PDC inaccurate; LF 01/04/22  Medication Access: Within the past 30 days, how often has patient missed a dose of medication? 0 Is a pillbox or other method used to improve adherence? Yes  Factors that may affect medication adherence? no barriers identified Are meds synced by current pharmacy? No  Are meds delivered by current pharmacy? Yes  Does patient experience delays in picking up medications due to transportation concerns? No   [facility] Reviewed: Is patient disadvantaged to use [facility]?: No  Current Rx insurance plan: HTA Name and location of Current pharmacy:  [facility] [place] -  [address] [place] [address] Phone: [phone] Fax: [phone]  [facility] services reviewed with patient today?: No  Patient requests to transfer care to [facility]?: No  Reason patient declined to change pharmacies: Loyalty to other pharmacy/Patient preference  Care Plan and Follow Up Patient Decision:  Patient agrees to Care Plan and Follow-up.  Plan: Telephone follow up appointment with care management team member scheduled for:  3 months  Charlene Brooke, PharmD, BCACP Clinical Pharmacist [facility] at [facility] [phone]

## 2022-03-28 DIAGNOSIS — I152 Hypertension secondary to endocrine disorders: Secondary | ICD-10-CM

## 2022-03-28 DIAGNOSIS — E785 Hyperlipidemia, unspecified: Secondary | ICD-10-CM | POA: Diagnosis not present

## 2022-03-28 DIAGNOSIS — Z794 Long term (current) use of insulin: Secondary | ICD-10-CM | POA: Diagnosis not present

## 2022-03-28 DIAGNOSIS — E1169 Type 2 diabetes mellitus with other specified complication: Secondary | ICD-10-CM

## 2022-03-28 DIAGNOSIS — I25118 Atherosclerotic heart disease of native coronary artery with other forms of angina pectoris: Secondary | ICD-10-CM

## 2022-03-28 DIAGNOSIS — E11319 Type 2 diabetes mellitus with unspecified diabetic retinopathy without macular edema: Secondary | ICD-10-CM | POA: Diagnosis not present

## 2022-03-28 DIAGNOSIS — F331 Major depressive disorder, recurrent, moderate: Secondary | ICD-10-CM | POA: Diagnosis not present

## 2022-03-28 DIAGNOSIS — E1159 Type 2 diabetes mellitus with other circulatory complications: Secondary | ICD-10-CM | POA: Diagnosis not present

## 2022-03-29 ENCOUNTER — Ambulatory Visit (INDEPENDENT_AMBULATORY_CARE_PROVIDER_SITE_OTHER): Payer: PPO | Admitting: Family

## 2022-03-29 ENCOUNTER — Encounter: Payer: Self-pay | Admitting: Family

## 2022-03-29 VITALS — BP 129/68 | HR 80 | Temp 98.3°F | Resp 16 | Ht 66.0 in | Wt 267.0 lb

## 2022-03-29 DIAGNOSIS — J029 Acute pharyngitis, unspecified: Secondary | ICD-10-CM | POA: Diagnosis not present

## 2022-03-29 DIAGNOSIS — J020 Streptococcal pharyngitis: Secondary | ICD-10-CM | POA: Diagnosis not present

## 2022-03-29 DIAGNOSIS — Z20822 Contact with and (suspected) exposure to covid-19: Secondary | ICD-10-CM | POA: Diagnosis not present

## 2022-03-29 LAB — POC COVID19 BINAXNOW: SARS Coronavirus 2 Ag: POSITIVE — AB

## 2022-03-29 LAB — POCT RAPID STREP A (OFFICE): Rapid Strep A Screen: POSITIVE — AB

## 2022-03-29 MED ORDER — AMOXICILLIN-POT CLAVULANATE 875-125 MG PO TABS: 1.0000 | ORAL_TABLET | Freq: Two times a day (BID) | ORAL | 0 refills | Status: DC

## 2022-03-29 NOTE — Assessment & Plan Note (Signed)
Strep tested in office, positive Warm salt water gargles   

## 2022-03-29 NOTE — Assessment & Plan Note (Signed)
Strep tested positive in office.  rx augmentin 875/125 mg po bid x 10 days tyelnol prn sore throat/fever Pt told to F/u if no improvement in the next 2-3 days.  

## 2022-03-29 NOTE — Patient Instructions (Signed)
You were found to be strep positive,  Take antibiotics that have been sent to the pharmacy.  Change your toothbrush after 24 hours on the antibiotics.  Gargle with warm salt water as needed for sore throat.   Due to recent changes in healthcare laws, you may see results of your imaging and/or laboratory studies on MyChart before I have had a chance to review them.  I understand that in some cases there may be results that are confusing or concerning to you. Please understand that not all results are received at the same time and often I may need to interpret multiple results in order to provide you with the best plan of care or course of treatment. Therefore, I ask that you please give me 2 business days to thoroughly review all your results before contacting my office for clarification. Should we see a critical lab result, you will be contacted sooner.   It was a pleasure seeing you today! Please do not hesitate to reach out with any questions and or concerns.  Regards,   Avah Bashor FNP-C    

## 2022-03-29 NOTE — Progress Notes (Signed)
Established Patient Office Visit  Subjective:  Patient ID: [name], male    DOB: 1951-02-22  Age: 71 y.o. MRN: [id number]  CC:  Chief Complaint  Patient presents with   Sore Throat    X 3 weeks   Cough   Fatigue    HPI [name] is here today with concerns.   Three weeks ago with sore throat, cough and fatigue.  Covid tested and negative.  No chest congestion. No cough.  No nasal congestion. Slight left sided ear pain. No sinus pressure.   No fever or chills.   Decreased appetite and no energy.    Past Medical History:  Diagnosis Date   Back injury 02/2002   worker's comp   CHF (congestive heart failure) (HCC)    Coronary artery disease, non-occlusive    a. cath 2002 with no sig CAD;  b. cath 2008 normal LM, LAD, LCx, p&dRCA 20-30%, PDA 30%; c.11/2015 NSTEMI/PCI: LM nl, LAD 95p (2.5x15 Xience DES), LCX nl, RCA 100p/m w/ L->R collats, EF 55-65% c. NSTEMI (02/2016) with no culprit leision, switched to Brilinta.  d. NSTEMI 03/2016: again, no culprit lesion and switched back to plavix 2/2 SOB with Brilnta.     Depression    Diabetes mellitus type 2, insulin dependent (HCC)    Hyperlipemia    Hypertensive heart disease    Kidney stones    Morbid obesity (HCC)    Osteoarthritis    Snoring     Past Surgical History:  Procedure Laterality Date   CARDIAC CATHETERIZATION  09/29/2000   diffuse LAD 30% LCA  EF 50-60%   CARDIAC CATHETERIZATION  06/30/2007   no significant CAD   CARDIAC CATHETERIZATION N/A 12/25/2015   Procedure: Left Heart Cath and Coronary Angiography;  Surgeon: Antonieta Iba, MD;  Location: [facility] INVASIVE CV LAB;  Service: Cardiovascular;  Laterality: N/A;   CARDIAC CATHETERIZATION N/A 12/25/2015   Procedure: Coronary Stent Intervention;  Surgeon: Alwyn Pea, MD;  Location: [facility] INVASIVE CV LAB;  Service: Cardiovascular;  Laterality: N/A;   CARDIAC CATHETERIZATION N/A 03/10/2016   Procedure: Left Heart Cath and Coronary Angiography;   Surgeon: Iran Ouch, MD;  Location: [facility] INVASIVE CV LAB;  Service: Cardiovascular;  Laterality: N/A;   CARDIAC CATHETERIZATION N/A 04/06/2016   Procedure: Left Heart Cath and Coronary Angiography;  Surgeon: Lyn Records, MD;  Location: [facility] INVASIVE CV LAB;  Service: Cardiovascular;  Laterality: N/A;   CIRCUMCISION     CORONARY ARTERY BYPASS GRAFT      Family History  Problem Relation Age of Onset   Alzheimer's disease Mother    Emphysema Mother    Diabetes Father    Heart disease Father        MI   Cancer Brother        ? Neck cancer    Social History   Socioeconomic History   Marital status: Widowed    Spouse name: Not on file   Number of children: Not on file   Years of education: Not on file   Highest education level: Not on file  Occupational History   Occupation: disabled    Employer: UNEMPLOYED    Comment: back injury  Tobacco Use   Smoking status: Never   Smokeless tobacco: Never  Vaping Use   Vaping Use: Never used  Substance and Sexual Activity   Alcohol use: No    Alcohol/week: 0.0 standard drinks of alcohol   Drug use: No   Sexual activity:  Not Currently  Other Topics Concern   Not on file  Social History Narrative   Financial concerns, wife with depression.   Minimal exercise.   Diet: poor.   Social Determinants of Health   Financial Resource Strain: Low Risk  (11/11/2021)   Overall Financial Resource Strain (CARDIA)    Difficulty of Paying Living Expenses: Not very hard  Food Insecurity: No Food Insecurity (11/11/2021)   Hunger Vital Sign    Worried About Running Out of Food in the Last Year: Never true    Ran Out of Food in the Last Year: Never true  Transportation Needs: No Transportation Needs (11/11/2021)   PRAPARE - Administrator, Civil Service (Medical): No    Lack of Transportation (Non-Medical): No  Physical Activity: Inactive (11/11/2021)   Exercise Vital Sign    Days of Exercise per Week: 0 days    Minutes of  Exercise per Session: 0 min  Stress: No Stress Concern Present (11/11/2021)   [employer] of Occupational Health - Occupational Stress Questionnaire    Feeling of Stress : Only a little  Social Connections: Moderately Isolated (11/11/2021)   Social Connection and Isolation Panel [NHANES]    Frequency of Communication with Friends and Family: More than three times a week    Frequency of Social Gatherings with Friends and Family: More than three times a week    Attends Religious Services: More than 4 times per year    Active Member of [employer] or Organizations: No    Attends Banker Meetings: Never    Marital Status: Widowed  Intimate Partner Violence: Not At Risk (11/11/2021)   Humiliation, Afraid, Rape, and Kick questionnaire    Fear of Current or Ex-Partner: No    Emotionally Abused: No    Physically Abused: No    Sexually Abused: No    Outpatient Medications Prior to Visit  Medication Sig Dispense Refill   aspirin 81 MG EC tablet Take 1 tablet (81 mg total) by mouth daily.     carvedilol (COREG) 6.25 MG tablet TAKE 1 TABLET BY MOUTH ONCE DAILY 90 tablet 3   cholecalciferol (VITAMIN D3) 25 MCG (1000 UNIT) tablet Take 2,000 Units by mouth daily.     clopidogrel (PLAVIX) 75 MG tablet TAKE 1 TABLET BY MOUTH ONCE DAILY WITH BREAKFAST 90 tablet 3   Continuous Blood Gluc Receiver (FREESTYLE LIBRE 2 READER) DEVI Use with sensors to monitor sugar continuously 1 each 0   Continuous Blood Gluc Sensor (FREESTYLE LIBRE 2 SENSOR) MISC Apply sensor every 14 days to monitor sugar continously 2 each 5   Dulaglutide 4.5 MG/0.5ML SOPN Inject 4.5 mg into the skin once a week. Inject 4.5 mg once weekly 2 mL 0   ezetimibe (ZETIA) 10 MG tablet TAKE 1 TABLET BY MOUTH ONCE DAILY 30 tablet 8   furosemide (LASIX) 40 MG tablet TAKE 1 TABLET BY MOUTH DAILY CAN TAKE A SECOND DAILY DOSE AS NEEDED 180 tablet 1   HYDROcodone-acetaminophen (NORCO) 5-325 MG tablet Take 1 tablet by mouth 2 (two) times  daily as needed for moderate pain. 60 tablet 0   HYDROcodone-acetaminophen (NORCO/VICODIN) 5-325 MG tablet TAKE 1 TABLET BY MOUTH TWICE A DAY AS NEEDED FOR MODERATE PAIN 60 tablet 0   HYDROcodone-acetaminophen (NORCO/VICODIN) 5-325 MG tablet TAKE 1 TABLET BY MOUTH TWICE (2) DAILY AS NEEDED FOR MODERATE PAIN 60 tablet 0   hydrOXYzine (ATARAX) 10 MG tablet TAKE 1 TABLET BY MOUTH 3 TIMES DAILY AS NEEDED FOR ANXIETY  30 tablet 2   Insulin Aspart FlexPen 100 UNIT/ML SOPN Inject 13 units in the morning and 8 units in the evening (Patient taking differently: 13 units in AM (scheduled) and 3 units PRN in evening) 15 mL 6   isosorbide mononitrate (IMDUR) 30 MG 24 hr tablet TAKE 1 TABLET BY MOUTH TWICE DAILY 180 tablet 1   losartan (COZAAR) 50 MG tablet TAKE 1 TABLET BY MOUTH ONCE DAILY 90 tablet 1   nitroGLYCERIN (NITROSTAT) 0.4 MG SL tablet DISSOLVE 1 TABLET UNDER TONGUE AS NEEDEDFOR CHEST PAIN. MAY REPEAT 5 MINUTES APART 3 TIMES IF NEEDED 25 tablet 3   TRESIBA FLEXTOUCH 100 UNIT/ML FlexTouch Pen INJECT 60 UNITS INTO THE SKIN DAILY (Patient taking differently: Inject 50 Units into the skin daily.) 15 mL 3   TRUEPLUS 5-BEVEL PEN NEEDLES 31G X 6 MM MISC USE TO INJECT INSULIN 3 TIMES A DAY 300 each 3   venlafaxine XR (EFFEXOR-XR) 150 MG 24 hr capsule TAKE 1 CAPSULE BY MOUTH DAILY WITH BREAKFAST. TAKE WITH EFFEXOR XR 75MG  FORA TOTAL OF 225MG  90 capsule 1   venlafaxine XR (EFFEXOR-XR) 75 MG 24 hr capsule TAKE 1 CAPSULE BY MOUTH ONCE DAILY. TAKEIN ADDITION TO THE 150 MG CAPSULE FOR A TOTAL DOSE OF 225MG  DAILY 90 capsule 1   vitamin B-12 (CYANOCOBALAMIN) 1000 MCG tablet Take 1,000 mcg by mouth daily.     No facility-administered medications prior to visit.    Allergies  Allergen Reactions   Atorvastatin Other (See Comments)    Body aches Similar effect with rosuvastatin 40 mg twice weekly        Objective:    Physical Exam Constitutional:      General: He is awake. He is not in acute distress.     Appearance: Normal appearance. He is not ill-appearing.  HENT:     Right Ear: Tympanic membrane normal.     Left Ear: Tympanic membrane normal.     Nose: Nose normal.     Right Turbinates: Not enlarged or swollen.     Left Turbinates: Not enlarged or swollen.     Right Sinus: No maxillary sinus tenderness or frontal sinus tenderness.     Left Sinus: No maxillary sinus tenderness or frontal sinus tenderness.     Mouth/Throat:     Mouth: Mucous membranes are moist.     Pharynx: No pharyngeal swelling, oropharyngeal exudate or posterior oropharyngeal erythema.  Eyes:     Extraocular Movements: Extraocular movements intact.     Pupils: Pupils are equal, round, and reactive to light.  Cardiovascular:     Rate and Rhythm: Normal rate and regular rhythm.  Pulmonary:     Effort: Pulmonary effort is normal.     Breath sounds: Normal breath sounds. No wheezing.  Neurological:     Mental Status: He is alert.     BP 129/68   Pulse 80   Temp 98.3 F (36.8 C)   Resp 16   Ht 5\' 6"  (1.676 m)   Wt 267 lb (121.1 kg)   SpO2 99%   BMI 43.09 kg/m  Wt Readings from Last 3 Encounters:  03/29/22 267 lb (121.1 kg)  02/15/22 274 lb 2 oz (124.3 kg)  02/08/22 275 lb (124.7 kg)     Health Maintenance Due  Topic Date Due   Zoster Vaccines- Shingrix (1 of 2) Never done   COVID-19 Vaccine (4 - Pfizer series) 07/24/2020   OPHTHALMOLOGY EXAM  01/26/2022   INFLUENZA VACCINE  03/29/2022  There are no preventive care reminders to display for this patient.  Lab Results  Component Value Date   TSH 2.64 10/19/2017   Lab Results  Component Value Date   WBC 6.6 09/15/2021   HGB 12.1 (L) 09/15/2021   HCT 37.7 (L) 09/15/2021   MCV 85.1 09/15/2021   PLT 251 09/15/2021   Lab Results  Component Value Date   NA 136 09/15/2021   K 3.7 09/15/2021   CO2 27 09/15/2021   GLUCOSE 300 (H) 09/15/2021   BUN 20 09/15/2021   CREATININE 1.20 09/15/2021   BILITOT 0.4 11/02/2020   ALKPHOS 77 11/02/2020    AST 18 11/02/2020   ALT 14 11/02/2020   PROT 7.4 11/02/2020   ALBUMIN 3.9 11/02/2020   CALCIUM 8.6 (L) 09/15/2021   ANIONGAP 5 09/15/2021   GFR 71.68 11/02/2020   Lab Results  Component Value Date   HGBA1C 7.5 (A) 02/08/2022      Assessment & Plan:   Problem List Items Addressed This Visit       Respiratory   Strep pharyngitis    Strep tested positive in office.  rx augmentin 875/125 mg po bid x 10 days tyelnol prn sore throat/fever Pt told to F/u if no improvement in the next 2-3 days.       Relevant Medications   amoxicillin-clavulanate (AUGMENTIN) 875-125 MG tablet     Other   Suspected COVID-19 virus infection    covid test also positive however on three weeks with symptoms Will treat strep as lung exam ok today However if pt with fatigue, fever, sob please f/u       Relevant Orders   POC COVID-19 BinaxNow   Sore throat - Primary    Strep tested in office, positive Warm salt water gargles        Relevant Medications   amoxicillin-clavulanate (AUGMENTIN) 875-125 MG tablet   Other Relevant Orders   POCT rapid strep A (Completed)   POC COVID-19 BinaxNow    Meds ordered this encounter  Medications   amoxicillin-clavulanate (AUGMENTIN) 875-125 MG tablet    Sig: Take 1 tablet by mouth 2 (two) times daily for 10 days.    Dispense:  20 tablet    Refill:  0    Order Specific Question:   Supervising Provider    Answer:   BEDSOLE, AMY E [[id number]]    Follow-up: Return if symptoms worsen or fail to improve with pcp.    Mort Sawyers, FNP

## 2022-03-29 NOTE — Assessment & Plan Note (Signed)
covid test also positive however on three weeks with symptoms Will treat strep as lung exam ok today However if pt with fatigue, fever, sob please f/u

## 2022-03-30 NOTE — Patient Instructions (Addendum)
Visit Information  Phone number for Pharmacist: [phone]   Goals Addressed   None     Patient Care Plan: [facility]CM Pharmacy Care Plan     Problem Identified: Hypertension, Hyperlipidemia, Diabetes, Heart Failure, Coronary Artery Disease, and Anxiety   Priority: High     Long-Range Goal: Disease mgmt   Start Date: 12/31/2020  Expected End Date: 12/21/2022  This Visit's Progress: On track  Recent Progress: On track  Priority: High  Note:   Current Barriers:  Due for lipid panel  Pharmacist Clinical Goal(s):  Patient will achieve control of diabetes as evidence by A1c and home BG monitoring through collaboration with PharmD and provider.   Interventions: 1:1 collaboration with [name], MD regarding development and update of comprehensive plan of care as evidenced by provider attestation and co-signature Inter-disciplinary care team collaboration (see longitudinal plan of care) Comprehensive medication review performed; medication list updated in electronic medical record  Diabetes (A1c goal <7%) -Not ideally controlled - A1c 7.5% (01/2022) somewhat worse after insulin dose changes; he was having issues with hypoglycemia a few months ago but is doing better now -Not usually using Novolog at night anymore -Current home glucose readings - using Josephine Igo (reader) - he is not sure how to navigate reader to show averages, GMI, etc.  Fasting BG: 90-140  Pt says Josephine Igo does not alarm very often (set to alarm for < 70 or > 240) -Current medications: Trulicity 4.5 mg weekly (PAP) - Appropriate, Effective, Safe, Accessible Tresiba - 50u AM -Appropriate, Effective, Safe, Accessible Novolog - 13u AM, 5u PM -Appropriate, Effective, Safe, Accessible Freestyle Libre 2 - Appropriate, Effective, Safe, Accessible -Medications previously tried:  metformin -Reviewed rule of 15 for hypoglcyemia -Recommend continue current medications;  Hyperlipidemia / CAD (LDL goal < 70) -Query controlled -  LDL 56 (10/2020); He stopped rosuvastatin July 2022. This is his second failed statin. He continues Zetia as prescribed. His cholesterol may have improved with weight loss and improved diabetes control this year. -Hx CAD (hx NSTEMI/PCI 2017) -Current treatment: Ezetimibe 10 mg daily -Appropriate, Query Effective Clopidogrel 75 mg daily -Appropriate, Effective, Safe, Accessible Aspirin 81 mg daily -Appropriate, Effective, Safe, Accessible Nitroglycerin 0.4 mg SL prn -Appropriate, Effective, Safe, Accessible -Medications previously tried: rosuvastatin 40 mg 2x weekly, atorvastatin (dose unknown, on allergy list since at least 2009) -Educated on Cholesterol goals;  -Recommended to continue current medication; recommend repeat lipid panel  Hypertension / Heart Failure (BP goal <140/90) -Controlled - BP at goal in office -Last ejection fraction: 60-65% (Date: 03/2020) -HF type: Diastolic -Current home BP readings: n/a - pt does not have meter -Current treatment: Carvedilol 6.25 mg daily -Appropriate, Effective, Safe, Accessible Furosemide 40 mg daily -Appropriate, Effective, Safe, Accessible Isosorbide MN 30 mg BID -Appropriate, Effective, Safe, Accessible Losartan 50 mg daily -Appropriate, Effective, Safe, Accessible -Medications previously tried: n/a  -Denies hypotensive/hypertensive symptoms -Educated on BP goals and benefits of medications for prevention of heart attack, stroke and kidney damage;Daily salt intake goal < 2300 mg; -Counseled to monitor BP at home periodically -Recommended to continue current medication  Depression/Anxiety (Goal: manage symptoms) -Controlled - pt stopped taking bupropion Sunday - was having nausea, vomiting, constipation, fatigue, lost taste and assumed symptoms were due to bupropion; he reports he does feel better after being off med for 4 days now -PHQ9: 17 (10/2021) - moderately severe depression -GAD7: 14 (10/2021) - moderate anxiety -Connected with PCP  for mental health support -Current treatment: Bupropion XL 300 mg daily -stopped taking due to  presumed side effects Venlafaxine XR 150 mg daily -Appropriate, Effective, Safe, Accessible Venlafaxine XR 75 mg daily -Appropriate, Effective, Safe, Accessible Hydroxyzine 10 mg TID PRN - not taking -Medications previously tried/failed: n/a -Educated on Benefits of medication for symptom control -Discussed fatigue, loss of taste, vomiting are unlikely to be from bupropion (especially since he started this drug months ago and is just now having issues); it is more likely there was something else causing symptoms (infection, or Trulicity is known to cause some of these side effects); pt insists he feels better now off of bupropion so will continue off this med for now; advised to always contact PCP before stopping a medication -Recommended to continue current medication  Patient Goals/Self-Care Activities Patient will:  - continue to take medications as prescribed  -Contact PCP before stopping/changing a medication - call if BG < 70     Patient verbalizes understanding of instructions and care plan provided today and agrees to view in MyChart. Active MyChart status and patient understanding of how to access instructions and care plan via MyChart confirmed with patient.    Telephone follow up appointment with pharmacy team member scheduled for: 3 months  Al Corpus, PharmD, [facility] Clinical Pharmacist [facility] [phone]

## 2022-04-02 ENCOUNTER — Other Ambulatory Visit: Payer: Self-pay | Admitting: Cardiovascular Disease

## 2022-04-02 ENCOUNTER — Other Ambulatory Visit: Payer: Self-pay | Admitting: Family Medicine

## 2022-04-03 ENCOUNTER — Other Ambulatory Visit: Payer: Self-pay | Admitting: *Deleted

## 2022-04-04 ENCOUNTER — Other Ambulatory Visit: Payer: Self-pay | Admitting: Cardiovascular Disease

## 2022-04-04 ENCOUNTER — Other Ambulatory Visit: Payer: Self-pay | Admitting: Family Medicine

## 2022-04-04 NOTE — Telephone Encounter (Signed)
Good Morning [name]. Could you please advise if I should refill this medication or defer to PCP? [name] PCP last wrote a prescription for this medication on 12/07/2020. Thank you so much.

## 2022-04-11 ENCOUNTER — Other Ambulatory Visit: Payer: Self-pay | Admitting: Cardiovascular Disease

## 2022-04-26 ENCOUNTER — Other Ambulatory Visit: Payer: Self-pay | Admitting: Family Medicine

## 2022-04-29 ENCOUNTER — Other Ambulatory Visit: Payer: Self-pay | Admitting: Family Medicine

## 2022-05-11 ENCOUNTER — Other Ambulatory Visit: Payer: Self-pay | Admitting: Family Medicine

## 2022-05-11 NOTE — Telephone Encounter (Signed)
Name of Medication: Hydrocodone Name of Pharmacy: [facility] or Written Date and Quantity: 02/08/22#60 for 3 months Last Office Visit and Type:03/29/22 acute  Next Office Visit and Type: 05/12/22 Last Controlled Substance Agreement Date9/20/22:  Last UDS:05/17/21

## 2022-05-12 ENCOUNTER — Encounter: Payer: Self-pay | Admitting: Family Medicine

## 2022-05-12 ENCOUNTER — Ambulatory Visit (INDEPENDENT_AMBULATORY_CARE_PROVIDER_SITE_OTHER): Payer: HMO | Admitting: Family Medicine

## 2022-05-12 VITALS — BP 130/64 | HR 78 | Temp 97.9°F | Ht 66.0 in | Wt 267.0 lb

## 2022-05-12 DIAGNOSIS — E1159 Type 2 diabetes mellitus with other circulatory complications: Secondary | ICD-10-CM

## 2022-05-12 DIAGNOSIS — G8929 Other chronic pain: Secondary | ICD-10-CM | POA: Diagnosis not present

## 2022-05-12 DIAGNOSIS — E113393 Type 2 diabetes mellitus with moderate nonproliferative diabetic retinopathy without macular edema, bilateral: Secondary | ICD-10-CM

## 2022-05-12 DIAGNOSIS — N489 Disorder of penis, unspecified: Secondary | ICD-10-CM

## 2022-05-12 DIAGNOSIS — R21 Rash and other nonspecific skin eruption: Secondary | ICD-10-CM

## 2022-05-12 DIAGNOSIS — R0683 Snoring: Secondary | ICD-10-CM

## 2022-05-12 DIAGNOSIS — Z794 Long term (current) use of insulin: Secondary | ICD-10-CM

## 2022-05-12 DIAGNOSIS — Z23 Encounter for immunization: Secondary | ICD-10-CM

## 2022-05-12 DIAGNOSIS — R5383 Other fatigue: Secondary | ICD-10-CM

## 2022-05-12 DIAGNOSIS — I152 Hypertension secondary to endocrine disorders: Secondary | ICD-10-CM

## 2022-05-12 DIAGNOSIS — E11319 Type 2 diabetes mellitus with unspecified diabetic retinopathy without macular edema: Secondary | ICD-10-CM

## 2022-05-12 DIAGNOSIS — M545 Low back pain, unspecified: Secondary | ICD-10-CM | POA: Diagnosis not present

## 2022-05-12 LAB — POCT GLYCOSYLATED HEMOGLOBIN (HGB A1C): Hemoglobin A1C: 6.6 % — AB (ref 4.0–5.6)

## 2022-05-12 LAB — TESTOSTERONE: Testosterone: 293.16 ng/dL — ABNORMAL LOW (ref 300.00–890.00)

## 2022-05-12 MED ORDER — HYDROCODONE-ACETAMINOPHEN 5-325 MG PO TABS: ORAL_TABLET | ORAL | 0 refills | Status: DC

## 2022-05-12 MED ORDER — HYDROCODONE-ACETAMINOPHEN 5-325 MG PO TABS: 1.0000 | ORAL_TABLET | Freq: Two times a day (BID) | ORAL | 0 refills | Status: DC | PRN

## 2022-05-12 MED ORDER — TRIAMCINOLONE ACETONIDE 0.5 % EX CREA: 1.0000 | TOPICAL_CREAM | Freq: Two times a day (BID) | CUTANEOUS | 0 refills | Status: DC

## 2022-05-12 NOTE — Patient Instructions (Addendum)
[name].Marland Kitchen decrease to 57 units daily. Continue the Aspart  using sliding scale.  Continue Trulicity at 4.5 mg weekly.  Please stop at the lab to have labs drawn.  We will work on referral for sleep apnea testing to look into fatigue and urology for other issue.  Apply triamcinolone to rash 2 times daily for 2 weeks.

## 2022-05-12 NOTE — Progress Notes (Signed)
Patient ID: [name], male    DOB: 06-Oct-1950, 71 y.o.   MRN: [id number]  This visit was conducted in person.  BP 130/64   Pulse 78   Temp 97.9 F (36.6 C) (Oral)   Ht 5\' 6"  (1.676 m)   Wt 267 lb (121.1 kg)   SpO2 98%   BMI 43.09 kg/m    CC:  Chief Complaint  Patient presents with  . Diabetes  . Fatigue    Subjective:   HPI: [name] is a 71 y.o. male presenting on 05/12/2022 for Diabetes and Fatigue  Diabetes: Improved control on dulaglutide 4.5 mg once weekly, Tresiba 60 units daily and 12 Units  in AM and 5 units in PM Aspart per sliding scale.. but frequently not taking. Lab Results  Component Value Date   HGBA1C 6.6 (A) 05/12/2022  Using medications without difficulties: Hypoglycemic episodes: Hyperglycemic episodes: Feet problems: no ulcers Blood Sugars averaging: occ < 60 eye exam within last year:  Hypertension:  Well controlled on Coreg 6.25 milligrams p.o. twice daily, losartan 50 mg p.o. daily BP Readings from Last 3 Encounters:  05/12/22 130/64  03/29/22 129/68  02/15/22 140/60  Using medication without problems or lightheadedness:  none Chest pain with exertion:none Edema:none Short of breath:none Average home BPs: Other issues:   Wt Readings from Last 3 Encounters:  05/12/22 267 lb (121.1 kg)  03/29/22 267 lb (121.1 kg)  02/15/22 274 lb 2 oz (124.3 kg)  Body mass index is 43.09 kg/m.   Indication for chronic opioid: Chronic back pain Medication and dose: Hydrocodone 1 tablet p.o. 2 times daily daily as needed # pills per month: 60 Last UDS date: Due Opioid Treatment Agreement signed (Y/N): Y Opioid Treatment Agreement last reviewed with patient:  Y NCCSRS reviewed this encounter (include red flags):      PDMP reviewed during this encounter.    Had SE to wellbutrin.. no respose with energy or mood.   Relevant past medical, surgical, family and social history reviewed and updated as indicated. Interim medical history since  our last visit reviewed. Allergies and medications reviewed and updated. Outpatient Medications Prior to Visit  Medication Sig Dispense Refill  . aspirin 81 MG EC tablet Take 1 tablet (81 mg total) by mouth daily.    . carvedilol (COREG) 6.25 MG tablet TAKE 1 TABLET BY MOUTH ONCE DAILY 90 tablet 2  . cholecalciferol (VITAMIN D3) 25 MCG (1000 UNIT) tablet Take 2,000 Units by mouth daily.    . clopidogrel (PLAVIX) 75 MG tablet TAKE 1 TABLET BY MOUTH ONCE DAILY WITH BREAKFAST 90 tablet 3  . Continuous Blood Gluc Receiver (FREESTYLE LIBRE 2 READER) DEVI Use with sensors to monitor sugar continuously 1 each 0  . Continuous Blood Gluc Sensor (FREESTYLE LIBRE 2 SENSOR) MISC Apply sensor every 14 days to monitor sugar continously 2 each 5  . Dulaglutide 4.5 MG/0.5ML SOPN Inject 4.5 mg into the skin once a week. Inject 4.5 mg once weekly 2 mL 0  . ezetimibe (ZETIA) 10 MG tablet TAKE 1 TABLET BY MOUTH ONCE DAILY 30 tablet 8  . furosemide (LASIX) 40 MG tablet TAKE 1 TABLET BY MOUTH ONCE A DAY  CAN TAKE A 2ND DAILY DOSE AS NEEDED. 180 tablet 1  . HYDROcodone-acetaminophen (NORCO) 5-325 MG tablet Take 1 tablet by mouth 2 (two) times daily as needed for moderate pain. 60 tablet 0  . HYDROcodone-acetaminophen (NORCO/VICODIN) 5-325 MG tablet TAKE 1 TABLET BY MOUTH TWICE A DAY AS  NEEDED FOR MODERATE PAIN 60 tablet 0  . HYDROcodone-acetaminophen (NORCO/VICODIN) 5-325 MG tablet TAKE 1 TABLET BY MOUTH TWICE (2) DAILY AS NEEDED FOR MODERATE PAIN 60 tablet 0  . hydrOXYzine (ATARAX) 10 MG tablet TAKE 1 TABLET BY MOUTH 3 TIMES DAILY AS NEEDED FOR ANXIETY 30 tablet 2  . insulin aspart (NOVOLOG FLEXPEN) 100 UNIT/ML FlexPen 13 units in AM (scheduled) and 3 units PRN in evening 15 mL 6  . isosorbide mononitrate (IMDUR) 30 MG 24 hr tablet TAKE 1 TABLET BY MOUTH TWICE DAILY 180 tablet 1  . losartan (COZAAR) 50 MG tablet TAKE 1 TABLET BY MOUTH ONCE DAILY 90 tablet 1  . nitroGLYCERIN (NITROSTAT) 0.4 MG SL tablet DISSOLVE 1  TABLET UNDER TONGUE AS NEEDEDFOR CHEST PAIN. MAY REPEAT 5 MINUTES APART 3 TIMES IF NEEDED 25 tablet 3  . TRESIBA FLEXTOUCH 100 UNIT/ML FlexTouch Pen INJECT 60 UNITS INTO THE SKIN DAILY 15 mL 3  . TRUEPLUS 5-BEVEL PEN NEEDLES 31G X 6 MM MISC USE TO INJECT INSULIN 3 TIMES A DAY 300 each 3  . venlafaxine XR (EFFEXOR-XR) 150 MG 24 hr capsule TAKE 1 CAPSULE BY MOUTH DAILY WITH BREAKFAST. TAKE WITH EFFEXOR XR 75MG  FORA TOTAL OF 225MG  90 capsule 1  . venlafaxine XR (EFFEXOR-XR) 75 MG 24 hr capsule TAKE 1 CAPSULE BY MOUTH ONCE DAILY. TAKEIN ADDITION TO THE 150 MG CAPSULE FOR A TOTAL DOSE OF 225MG  DAILY 90 capsule 1  . vitamin B-12 (CYANOCOBALAMIN) 1000 MCG tablet Take 1,000 mcg by mouth daily.     No facility-administered medications prior to visit.     Per HPI unless specifically indicated in ROS section below Review of Systems Objective:  BP 130/64   Pulse 78   Temp 97.9 F (36.6 C) (Oral)   Ht 5\' 6"  (1.676 m)   Wt 267 lb (121.1 kg)   SpO2 98%   BMI 43.09 kg/m   Wt Readings from Last 3 Encounters:  05/12/22 267 lb (121.1 kg)  03/29/22 267 lb (121.1 kg)  02/15/22 274 lb 2 oz (124.3 kg)      Physical Exam    Results for orders placed or performed in visit on 03/29/22  POCT rapid strep A  Result Value Ref Range   Rapid Strep A Screen Positive (A) Negative  POC COVID-19 BinaxNow  Result Value Ref Range   SARS Coronavirus 2 Ag Positive (A) Negative     COVID 19 screen:  No recent travel or known exposure to COVID19 The patient denies respiratory symptoms of COVID 19 at this time. The importance of social distancing was discussed today.   Assessment and Plan     05/14/22, MD

## 2022-05-15 LAB — DRUG MONITORING, PANEL 8 WITH CONFIRMATION, URINE
6 Acetylmorphine: NEGATIVE ng/mL (ref <10)
Alcohol Metabolites: NEGATIVE ng/mL (ref <500)
Amphetamines: NEGATIVE ng/mL (ref <500)
Benzodiazepines: NEGATIVE ng/mL (ref <100)
Buprenorphine, Urine: NEGATIVE ng/mL (ref <5)
Cocaine Metabolite: NEGATIVE ng/mL (ref <150)
Codeine: NEGATIVE ng/mL (ref <50)
Creatinine: 191.3 mg/dL (ref >=20.0)
Hydrocodone: 265 ng/mL — ABNORMAL HIGH (ref <50)
Hydromorphone: 111 ng/mL — ABNORMAL HIGH (ref <50)
MDMA: NEGATIVE ng/mL (ref <500)
Marijuana Metabolite: NEGATIVE ng/mL (ref <20)
Morphine: NEGATIVE ng/mL (ref <50)
Norhydrocodone: 429 ng/mL — ABNORMAL HIGH (ref <50)
Opiates: POSITIVE ng/mL — AB (ref <100)
Oxidant: NEGATIVE ug/mL (ref <200)
Oxycodone: NEGATIVE ng/mL (ref <100)
pH: 5.2 (ref 4.5–9.0)

## 2022-05-15 LAB — DM TEMPLATE

## 2022-05-16 DIAGNOSIS — N489 Disorder of penis, unspecified: Secondary | ICD-10-CM | POA: Insufficient documentation

## 2022-05-16 DIAGNOSIS — R5383 Other fatigue: Secondary | ICD-10-CM | POA: Insufficient documentation

## 2022-05-16 NOTE — Assessment & Plan Note (Signed)
No improvement in energy with treatment of mute mood.  He did not tolerate Wellbutrin.  We will look into additional secondary causes such as low testosterone or sleep apnea.

## 2022-05-16 NOTE — Assessment & Plan Note (Signed)
Chronic, stable Followed by ophthalmology

## 2022-05-16 NOTE — Assessment & Plan Note (Signed)
Chronic back pain Medication and dose: Hydrocodone 1 tablet p.o. 2 times daily daily as needed # pills per month: 60 Last UDS date: Due Opioid Treatment Agreement signed (Y/N): Y Opioid Treatment Agreement last reviewed with patient:  Y

## 2022-05-16 NOTE — Assessment & Plan Note (Signed)
Well controlled on Coreg 6.25 milligrams p.o. twice daily, losartan 50 mg p.o. daily

## 2022-05-16 NOTE — Assessment & Plan Note (Signed)
Referral to pulmonary for evaluation for sleep apnea.

## 2022-05-16 NOTE — Assessment & Plan Note (Signed)
Chronic, improved control but having lows [place].Marland Kitchen decrease to 57 units daily. Continue the Aspart  using sliding scale.  Continue Trulicity at 4.5 mg weekly.

## 2022-05-16 NOTE — Assessment & Plan Note (Signed)
Acute, recurrent Treat with topical steroid cream.  Appears most consistent with contact dermatitis, allergic dermatitis or psoriasis

## 2022-05-16 NOTE — Assessment & Plan Note (Addendum)
Chronic retracted penis. Difficulty getting out of fatty tissue in groin.  Difficulty with erections and ejaculation.  Refer to urology for recommendations.  Encourage patient to continue working on weight loss

## 2022-05-18 ENCOUNTER — Ambulatory Visit (INDEPENDENT_AMBULATORY_CARE_PROVIDER_SITE_OTHER): Payer: HMO | Admitting: Urology

## 2022-05-18 ENCOUNTER — Encounter: Payer: Self-pay | Admitting: Urology

## 2022-05-18 VITALS — BP 103/53 | HR 84

## 2022-05-18 DIAGNOSIS — N5203 Combined arterial insufficiency and corporo-venous occlusive erectile dysfunction: Secondary | ICD-10-CM

## 2022-05-18 DIAGNOSIS — N4883 Acquired buried penis: Secondary | ICD-10-CM

## 2022-05-18 DIAGNOSIS — R5383 Other fatigue: Secondary | ICD-10-CM

## 2022-05-18 DIAGNOSIS — N489 Disorder of penis, unspecified: Secondary | ICD-10-CM

## 2022-05-18 DIAGNOSIS — E291 Testicular hypofunction: Secondary | ICD-10-CM | POA: Diagnosis not present

## 2022-05-18 NOTE — Progress Notes (Signed)
05/18/2022 3:35 PM   [name] 1950/10/21 [id number]  Referring provider: [name], MD [address] [place],  [place] [address]  Chief Complaint  Patient presents with   Erectile Dysfunction    Circumcision 15 years ago and have had problems since.    HPI: 71 year old male who presents today with multiple complaints.  Over the past 10 or more years, he reports that he just does not have much energy.  He is got to the point where he walks to the mailbox and feels "gassed. "  He does report that he is clinically depressed.  His wife passed away about 5 years ago and he is really never recovered from this.  He also has various urinary complaints, primarily that he has to "pop his penis out" because its hidden.  This leads to him having to sit to urinate and he does not feel like a man because of this.  His stream sprays all around and he dribbles as a result of this.  If his penis were longer or more visible, he does not think that he have any urinary issues at all.  He does get up a few times at night but attributes this to his diabetes.  He is not really bothered by that part of his urination.  He denies any dysuria or gross hematuria.  He also reports about 15 years ago, he started to experience erectile dysfunction.  His wife had coronary artery disease and so that he did not really engage in sexual activity for quite some time.  He is talking on the phone to some women.  He does not know if he wants to have treatment for his erectile dysfunction.  He does remember trying Viagra in the remote past but not recently.  He does not remember it being helpful.  He did have his testosterone tested by his PCP because of all of these various complaints and his testosterone was borderline low at 293.  He was previously 401 2 years ago.   IPSS     Row Name 05/18/22 1500         International Prostate Symptom Score   How often have you had the sensation of not emptying your  bladder? Less than 1 in 5     How often have you had to urinate less than every two hours? Less than half the time     How often have you found you stopped and started again several times when you urinated? Less than 1 in 5 times     How often have you found it difficult to postpone urination? More than half the time     How often have you had a weak urinary stream? Almost always     How often have you had to strain to start urination? Almost always     How many times did you typically get up at night to urinate? 3 Times     Total IPSS Score 21       Quality of Life due to urinary symptoms   If you were to spend the rest of your life with your urinary condition just the way it is now how would you feel about that? Unhappy              Score:  1-7 Mild 8-19 Moderate 20-35 Severe   SHIM     Row Name 05/18/22 1520         SHIM: Over the  last 6 months:   How do you rate your confidence that you could get and keep an erection? Very Low     When you had erections with sexual stimulation, how often were your erections hard enough for penetration (entering your partner)? Almost Never or Never     During sexual intercourse, how often were you able to maintain your erection after you had penetrated (entered) your partner? Almost Never or Never     During sexual intercourse, how difficult was it to maintain your erection to completion of intercourse? Extremely Difficult     When you attempted sexual intercourse, how often was it satisfactory for you? Almost Never or Never       SHIM Total Score   SHIM 5              Androgen Deficiency in the Aging Male     Row Name 05/18/22 1500         Androgen Deficiency in the Aging Male   Do you have a decrease in libido (sex drive) Yes     Do you have lack of energy Yes     Do you have a decrease in strength and/or endurance Yes     Have you lost height Yes     Have you noticed a decreased "enjoyment of life" Yes     Are you sad  and/or grumpy Yes     Are your erections less strong Yes     Have you noticed a recent deterioration in your ability to play sports Yes     Are you falling asleep after dinner Yes     Has there been a recent deterioration in your work performance Yes               PMH: Past Medical History:  Diagnosis Date   Back injury 02/2002   worker's comp   CHF (congestive heart failure) (HCC)    Coronary artery disease, non-occlusive    a. cath 2002 with no sig CAD;  b. cath 2008 normal LM, LAD, LCx, p&dRCA 20-30%, PDA 30%; c.11/2015 NSTEMI/PCI: LM nl, LAD 95p (2.5x15 Xience DES), LCX nl, RCA 100p/m w/ L->R collats, EF 55-65% c. NSTEMI (02/2016) with no culprit leision, switched to Brilinta.  d. NSTEMI 03/2016: again, no culprit lesion and switched back to plavix 2/2 SOB with Brilnta.     Depression    Diabetes mellitus type 2, insulin dependent (HCC)    Hyperlipemia    Hypertensive heart disease    Kidney stones    Morbid obesity (HCC)    Osteoarthritis    Snoring     Surgical History: Past Surgical History:  Procedure Laterality Date   CARDIAC CATHETERIZATION  09/29/2000   diffuse LAD 30% LCA  EF 50-60%   CARDIAC CATHETERIZATION  06/30/2007   no significant CAD   CARDIAC CATHETERIZATION N/A 12/25/2015   Procedure: Left Heart Cath and Coronary Angiography;  Surgeon: Antonieta Iba, MD;  Location: [facility] INVASIVE CV LAB;  Service: Cardiovascular;  Laterality: N/A;   CARDIAC CATHETERIZATION N/A 12/25/2015   Procedure: Coronary Stent Intervention;  Surgeon: Alwyn Pea, MD;  Location: [facility] INVASIVE CV LAB;  Service: Cardiovascular;  Laterality: N/A;   CARDIAC CATHETERIZATION N/A 03/10/2016   Procedure: Left Heart Cath and Coronary Angiography;  Surgeon: Iran Ouch, MD;  Location: [facility] INVASIVE CV LAB;  Service: Cardiovascular;  Laterality: N/A;   CARDIAC CATHETERIZATION N/A 04/06/2016   Procedure: Left Heart Cath and Coronary Angiography;  Surgeon:  Lyn Records, MD;   Location: [facility] INVASIVE CV LAB;  Service: Cardiovascular;  Laterality: N/A;   CIRCUMCISION     CORONARY ARTERY BYPASS GRAFT      Home Medications:  Allergies as of 05/18/2022       Reactions   Bupropion Nausea Only   Atorvastatin Other (See Comments)   Body aches Similar effect with rosuvastatin 40 mg twice weekly        Medication List        Accurate as of May 18, 2022  3:35 PM. If you have any questions, ask your nurse or doctor.          aspirin EC 81 MG tablet Take 1 tablet (81 mg total) by mouth daily.   carvedilol 6.25 MG tablet Commonly known as: COREG TAKE 1 TABLET BY MOUTH ONCE DAILY   cholecalciferol 25 MCG (1000 UNIT) tablet Commonly known as: VITAMIN D3 Take 2,000 Units by mouth daily.   clopidogrel 75 MG tablet Commonly known as: PLAVIX TAKE 1 TABLET BY MOUTH ONCE DAILY WITH BREAKFAST   cyanocobalamin 1000 MCG tablet Commonly known as: VITAMIN B12 Take 1,000 mcg by mouth daily.   Dulaglutide 4.5 MG/0.5ML Sopn Inject 4.5 mg into the skin once a week. Inject 4.5 mg once weekly   ezetimibe 10 MG tablet Commonly known as: ZETIA TAKE 1 TABLET BY MOUTH ONCE DAILY   FreeStyle Libre 2 Reader Devi Use with sensors to monitor sugar continuously   FreeStyle Libre 2 Sensor Misc Apply sensor every 14 days to monitor sugar continously   furosemide 40 MG tablet Commonly known as: LASIX TAKE 1 TABLET BY MOUTH ONCE A DAY  CAN TAKE A 2ND DAILY DOSE AS NEEDED.   HYDROcodone-acetaminophen 5-325 MG tablet Commonly known as: NORCO/VICODIN TAKE 1 TABLET BY MOUTH TWICE A DAY AS NEEDED FOR MODERATE PAIN   HYDROcodone-acetaminophen 5-325 MG tablet Commonly known as: Norco Take 1 tablet by mouth 2 (two) times daily as needed for moderate pain.   HYDROcodone-acetaminophen 5-325 MG tablet Commonly known as: NORCO/VICODIN TAKE 1 TABLET BY MOUTH TWICE (2) DAILY AS NEEDED FOR MODERATE PAIN   hydrOXYzine 10 MG tablet Commonly known as: ATARAX TAKE 1 TABLET  BY MOUTH 3 TIMES DAILY AS NEEDED FOR ANXIETY   isosorbide mononitrate 30 MG 24 hr tablet Commonly known as: IMDUR TAKE 1 TABLET BY MOUTH TWICE DAILY   losartan 50 MG tablet Commonly known as: COZAAR TAKE 1 TABLET BY MOUTH ONCE DAILY   nitroGLYCERIN 0.4 MG SL tablet Commonly known as: NITROSTAT DISSOLVE 1 TABLET UNDER TONGUE AS NEEDEDFOR CHEST PAIN. MAY REPEAT 5 MINUTES APART 3 TIMES IF NEEDED   NovoLOG FlexPen 100 UNIT/ML FlexPen Generic drug: insulin aspart 13 units in AM (scheduled) and 3 units PRN in evening   Tresiba FlexTouch 100 UNIT/ML FlexTouch Pen Generic drug: insulin degludec INJECT 60 UNITS INTO THE SKIN DAILY   triamcinolone cream 0.5 % Commonly known as: KENALOG Apply 1 Application topically 2 (two) times daily.   TRUEplus 5-Bevel Pen Needles 31G X 6 MM Misc Generic drug: Insulin Pen Needle USE TO INJECT INSULIN 3 TIMES A DAY   venlafaxine XR 75 MG 24 hr capsule Commonly known as: EFFEXOR-XR TAKE 1 CAPSULE BY MOUTH ONCE DAILY. TAKEIN ADDITION TO THE 150 MG CAPSULE FOR A TOTAL DOSE OF 225MG  DAILY   venlafaxine XR 150 MG 24 hr capsule Commonly known as: EFFEXOR-XR TAKE 1 CAPSULE BY MOUTH DAILY WITH BREAKFAST. TAKE WITH EFFEXOR XR 75MG  FORA TOTAL OF 225MG   Allergies:  Allergies  Allergen Reactions   Bupropion Nausea Only   Atorvastatin Other (See Comments)    Body aches Similar effect with rosuvastatin 40 mg twice weekly    Family History: Family History  Problem Relation Age of Onset   Alzheimer's disease Mother    Emphysema Mother    Diabetes Father    Heart disease Father        MI   Cancer Brother        ? Neck cancer    Social History:  reports that he has never smoked. He has never used smokeless tobacco. He reports that he does not drink alcohol and does not use drugs.   Physical Exam: BP (!) 103/53   Pulse 84   Constitutional:  Alert and oriented, No acute distress. HEENT: [place] AT, moist mucus membranes.  Trachea midline, no  masses. Cardiovascular: No clubbing, cyanosis, or edema. Respiratory: Normal respiratory effort, no increased work of breathing. GI: Abdomen is soft, nontender, nondistended, no abdominal masses GU: Buried penis with bilateral descended testicles Skin: No rashes, bruises or suspicious lesions. Neurologic: Grossly intact, no focal deficits, moving all 4 extremities. Psychiatric: Normal mood and affect.  Laboratory Data: Lab Results  Component Value Date   WBC 6.6 09/15/2021   HGB 12.1 (L) 09/15/2021   HCT 37.7 (L) 09/15/2021   MCV 85.1 09/15/2021   PLT 251 09/15/2021    Lab Results  Component Value Date   CREATININE 1.20 09/15/2021    Lab Results  Component Value Date   PSA 0.27 07/31/2020   PSA 0.30 03/19/2019   PSA 0.35 02/21/2017    Lab Results  Component Value Date   TESTOSTERONE 293.16 (L) 05/12/2022    Lab Results  Component Value Date   HGBA1C 6.6 (A) 05/12/2022    Assessment & Plan:    1. 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 Hypogonadism in male Lengthy discussion today about the pathophysiology of low testosterone.  He has multiple contributing factors to his symptoms which include multiple comorbid health conditions, cardiac disease, obesity, diabetes, and clinical depression all of which contribute to his fatigue and mood.  In addition to this, it is really only been recently that his testosterone levels have declined, likely secondary to conversion of testosterone to estrogen in the peripheral lipid cells.  He also has multiple risk  factors for erectile dysfunction from a cardiovascular standpoint and thus I do not feel that his erectile dysfunction is necessarily related to his lower testosterone levels.  He was also reassured that his circumcision has nothing to do with his erectile dysfunction.  We discussed that it is highly unlikely that the goals/outcome he is hoping to achieve by pursuing further evaluation and treatment for hypogonadism will achieve the desired clinical outcome.  He is interested in further testing.  We will repeat his labs.  Pending labs, will have a risk benefits further discussion about whether or not to pursue testosterone replacement therapy and/or Clomid. - FSH/LH; Future - Testosterone; Future - Prolactin; Future  3. Other fatigue As above  4. Combined arterial insufficiency  and corporo-venous occlusive erectile dysfunction As above  We discussed the option of sildenafil, ultimately he declined.  If he does wish to pursue something along with signs, he will need cardiac clearance given his cardiac history and use of isosorbide.   Follow-up to discuss labs discuss labs  Vanna ScotlandAshley Aeneas Longsworth, MD  [facility] [address], [address] [place], [place] [address] [phone]

## 2022-05-24 ENCOUNTER — Telehealth: Payer: Self-pay | Admitting: Family Medicine

## 2022-05-24 ENCOUNTER — Ambulatory Visit: Payer: HMO | Admitting: Family Medicine

## 2022-05-24 ENCOUNTER — Ambulatory Visit: Payer: HMO | Admitting: Family

## 2022-05-24 NOTE — Telephone Encounter (Addendum)
Patient called and canceled the appointment for today. He stated and said it was his meter.

## 2022-05-24 NOTE — Telephone Encounter (Signed)
Noted  

## 2022-05-24 NOTE — Telephone Encounter (Signed)
[name] Day - Client TELEPHONE ADVICE RECORD [facility]urse Patient Name: [name] Gender: Male DOB: [birth year] Age: 71 Y 32 M 14 D Return Phone Number: [phone] (Primary), [phone] (Secondary) Address[place]  [address] Client [facility] - Client Client Site [place] - Day Provider [name] - MD Contact Type Call Who Is Calling Patient / Member / Family / Caregiver Call Type Triage / Clinical Relationship To Patient Self Return Phone Number [phone] (Secondary) Chief Complaint BLOOD SUGAR LOW - 60 or less Reason for Call Symptomatic / Request for Health Information Initial Comment Caller states blood sugar low is it not registering. Translation No Nurse Assessment Nurse: Thad Ranger, RN, Denise Date/Time (Eastern Time): 05/24/2022 8:19:23 AM Confirm and document reason for call. If symptomatic, describe symptoms. ---Caller states blood sugar low is it not registering. Pt is a/o x 3. He drank milk and BG still reading low on monitor. Advised to eat PB sandwich now. Does the patient have any new or worsening symptoms? ---Yes Will a triage be completed? ---Yes Related visit to physician within the last 2 weeks? ---Yes Does the PT have any chronic conditions? (i.e. diabetes, asthma, this includes High risk factors for pregnancy, etc.) ---Yes List chronic conditions. ---IDDM Is this a behavioral health or substance abuse call? ---No Guidelines Guideline Title Affirmed Question Affirmed Notes Nurse Date/Time (Eastern Time) Diabetes - Low Blood Sugar [1] Low blood sugar symptoms persist > 30 minutes AND [2] using low blood sugar Care Advice Carmon, RN, Langley Gauss 05/24/2022 8:21:17 AM Disp. Time Eilene Ghazi Time) Disposition Final User 05/24/2022 8:15:45 AM Send to Urgent Queue Idolina Primer 05/24/2022 8:24:46 AM Go to ED Now Yes Carmon, RN, Langley Gauss PLEASE  NOTE: All timestamps contained within this report are represented as [place] Standard Time. CONFIDENTIALTY NOTICE: This fax transmission is intended only for the addressee. It contains information that is legally privileged, confidential or otherwise protected from use or disclosure. If you are not the intended recipient, you are strictly prohibited from reviewing, disclosing, copying using or disseminating any of this information or taking any action in reliance on or regarding this information. If you have received this fax in error, please notify us immediately by telephone so that we can arrange for its return to [place]. Phone: [phone], Toll-Free: [phone], Fax: [phone] Page: 2 of 2 Call Id: [id number] Final Disposition 05/24/2022 8:24:46 AM Go to ED Now Yes Thad Ranger, RN, Yevette Edwards Disagree/Comply Disagree Caller Understands Yes PreDisposition Call Doctor Care Advice Given Per Guideline GO TO ED NOW: NOTE TO TRIAGER - DRIVING: * Another adult should drive. BRING MEDICINES: * Please bring a list of your current medicines when you go to see the doctor. LOW BLOOD SUGAR - TREATMENT - EAT SOME (15-20 GRAMS) SUGAR NOW: * Milk (1 cup; 240 ml) CARE ADVICE given per Diabetes - Low Blood Sugar (Adult) guideline. Comments User: Romeo Apple, RN Date/Time Eilene Ghazi Time): 05/24/2022 8:23:02 AM States the blood glucometer has been saying LO for several days, yet the pt is NOT having s/s of hypoglycemia. Skin w/d, a/o x 3 and does not feel shaky. Referrals GO TO FACILITY REFUSE

## 2022-05-24 NOTE — Telephone Encounter (Signed)
Patient called and stated his sugar is low that it won't read on the meter. Patient was sent to access nurse.

## 2022-05-24 NOTE — Telephone Encounter (Signed)
Aaron Edelman at front desk spoke with pt and scheduled appt with Dr Diona Browner today at 2 PM. Pt told Aaron Edelman he is going to fire dept to see if his meter is working and pt will cb if needs to cancel appt. Sending note to Dr Diona Browner and Butch Penny CMA.

## 2022-06-08 ENCOUNTER — Other Ambulatory Visit: Payer: HMO

## 2022-06-10 ENCOUNTER — Other Ambulatory Visit: Payer: HMO

## 2022-06-10 DIAGNOSIS — E291 Testicular hypofunction: Secondary | ICD-10-CM

## 2022-06-11 LAB — PROLACTIN: Prolactin: 12.5 ng/mL (ref 4.0–15.2)

## 2022-06-11 LAB — TESTOSTERONE: Testosterone: 425 ng/dL (ref 264–916)

## 2022-06-11 LAB — FSH/LH
FSH: 8 m[IU]/mL (ref 1.5–12.4)
LH: 10.1 m[IU]/mL — ABNORMAL HIGH (ref 1.7–8.6)

## 2022-06-11 LAB — PSA: Prostate Specific Ag, Serum: 0.4 ng/mL (ref 0.0–4.0)

## 2022-06-15 ENCOUNTER — Encounter: Payer: Self-pay | Admitting: Physician Assistant

## 2022-06-15 ENCOUNTER — Ambulatory Visit (INDEPENDENT_AMBULATORY_CARE_PROVIDER_SITE_OTHER): Payer: HMO | Admitting: Physician Assistant

## 2022-06-15 VITALS — BP 119/66 | HR 91 | Ht 66.0 in | Wt 258.0 lb

## 2022-06-15 DIAGNOSIS — R5383 Other fatigue: Secondary | ICD-10-CM | POA: Diagnosis not present

## 2022-06-15 DIAGNOSIS — N5203 Combined arterial insufficiency and corporo-venous occlusive erectile dysfunction: Secondary | ICD-10-CM

## 2022-06-15 DIAGNOSIS — N4883 Acquired buried penis: Secondary | ICD-10-CM | POA: Diagnosis not present

## 2022-06-15 NOTE — Progress Notes (Signed)
06/15/2022 3:00 PM   KERMITT [name] 15-Sep-1950 [id number]  CC: Chief Complaint  Patient presents with   Follow-up   HPI: [name] is a 71 y.o. male with PMH buried penis, depression, fatigue, ED, and concerns for hypogonadism who presents today for lab recheck.   Repeat testosterone with Dr. Erlene Quan 5 days ago was normal at 425 ng/DL.  PSA normal at 0.4.  Today he reports he has lost approximately 30 pounds in the past several months by cutting back on simple carbohydrates and sodas.  He has noticed that his penis is protruding more than it was previously.  His buried penis remains his primary complaint today.  PMH: Past Medical History:  Diagnosis Date   Back injury 02/2002   worker's comp   CHF (congestive heart failure) ([place])    Coronary artery disease, non-occlusive    a. cath 2002 with no sig CAD;  b. cath 2008 normal LM, LAD, LCx, p&dRCA 20-30%, PDA 30%; c.11/2015 NSTEMI/PCI: LM nl, LAD 95p (2.5x15 Xience DES), LCX nl, RCA 100p/m w/ L->R collats, EF 55-65% c. NSTEMI (02/2016) with no culprit leision, switched to Brilinta.  d. NSTEMI 03/2016: again, no culprit lesion and switched back to plavix 2/2 SOB with Brilnta.     Depression    Diabetes mellitus type 2, insulin dependent ([place])    Hyperlipemia    Hypertensive heart disease    Kidney stones    Morbid obesity ([place])    Osteoarthritis    Snoring     Surgical History: Past Surgical History:  Procedure Laterality Date   CARDIAC CATHETERIZATION  09/29/2000   diffuse LAD 30% LCA  EF 50-60%   CARDIAC CATHETERIZATION  06/30/2007   no significant CAD   CARDIAC CATHETERIZATION N/A 12/25/2015   Procedure: Left Heart Cath and Coronary Angiography;  Surgeon: Minna Merritts, MD;  Location: [facility];  Service: Cardiovascular;  Laterality: N/A;   CARDIAC CATHETERIZATION N/A 12/25/2015   Procedure: Coronary Stent Intervention;  Surgeon: Yolonda Kida, MD;  Location: [facility];  Service:  Cardiovascular;  Laterality: N/A;   CARDIAC CATHETERIZATION N/A 03/10/2016   Procedure: Left Heart Cath and Coronary Angiography;  Surgeon: Wellington Hampshire, MD;  Location: [facility];  Service: Cardiovascular;  Laterality: N/A;   CARDIAC CATHETERIZATION N/A 04/06/2016   Procedure: Left Heart Cath and Coronary Angiography;  Surgeon: Belva Crome, MD;  Location: [facility];  Service: Cardiovascular;  Laterality: N/A;   CIRCUMCISION     CORONARY ARTERY BYPASS GRAFT      Home Medications:  Allergies as of 06/15/2022       Reactions   Bupropion Nausea Only   Atorvastatin Other (See Comments)   Body aches Similar effect with rosuvastatin 40 mg twice weekly        Medication List        Accurate as of June 15, 2022  3:00 PM. If you have any questions, ask your nurse or doctor.          aspirin EC 81 MG tablet Take 1 tablet (81 mg total) by mouth daily.   carvedilol 6.25 MG tablet Commonly known as: COREG TAKE 1 TABLET BY MOUTH ONCE DAILY   cholecalciferol 25 MCG (1000 UNIT) tablet Commonly known as: VITAMIN D3 Take 2,000 Units by mouth daily.   clopidogrel 75 MG tablet Commonly known as: PLAVIX TAKE 1 TABLET BY MOUTH ONCE DAILY WITH BREAKFAST   cyanocobalamin 1000 MCG tablet Commonly  known as: VITAMIN B12 Take 1,000 mcg by mouth daily.   Dulaglutide 4.5 MG/0.5ML Sopn Inject 4.5 mg into the skin once a week. Inject 4.5 mg once weekly   ezetimibe 10 MG tablet Commonly known as: ZETIA TAKE 1 TABLET BY MOUTH ONCE DAILY   FreeStyle Libre 2 Reader Devi Use with sensors to monitor sugar continuously   FreeStyle Libre 2 Sensor Misc Apply sensor every 14 days to monitor sugar continously   furosemide 40 MG tablet Commonly known as: LASIX TAKE 1 TABLET BY MOUTH ONCE A DAY  CAN TAKE A 2ND DAILY DOSE AS NEEDED.   HYDROcodone-acetaminophen 5-325 MG tablet Commonly known as: NORCO/VICODIN TAKE 1 TABLET BY MOUTH TWICE A DAY AS NEEDED FOR MODERATE  PAIN   HYDROcodone-acetaminophen 5-325 MG tablet Commonly known as: Norco Take 1 tablet by mouth 2 (two) times daily as needed for moderate pain.   HYDROcodone-acetaminophen 5-325 MG tablet Commonly known as: NORCO/VICODIN TAKE 1 TABLET BY MOUTH TWICE (2) DAILY AS NEEDED FOR MODERATE PAIN   hydrOXYzine 10 MG tablet Commonly known as: ATARAX TAKE 1 TABLET BY MOUTH 3 TIMES DAILY AS NEEDED FOR ANXIETY   isosorbide mononitrate 30 MG 24 hr tablet Commonly known as: IMDUR TAKE 1 TABLET BY MOUTH TWICE DAILY   losartan 50 MG tablet Commonly known as: COZAAR TAKE 1 TABLET BY MOUTH ONCE DAILY   nitroGLYCERIN 0.4 MG SL tablet Commonly known as: NITROSTAT DISSOLVE 1 TABLET UNDER TONGUE AS NEEDEDFOR CHEST PAIN. MAY REPEAT 5 MINUTES APART 3 TIMES IF NEEDED   NovoLOG FlexPen 100 UNIT/ML FlexPen Generic drug: insulin aspart 13 units in AM (scheduled) and 3 units PRN in evening   Tresiba FlexTouch 100 UNIT/ML FlexTouch Pen Generic drug: insulin degludec INJECT 60 UNITS INTO THE SKIN DAILY   triamcinolone cream 0.5 % Commonly known as: KENALOG Apply 1 Application topically 2 (two) times daily.   TRUEplus 5-Bevel Pen Needles 31G X 6 MM Misc Generic drug: Insulin Pen Needle USE TO INJECT INSULIN 3 TIMES A DAY   venlafaxine XR 75 MG 24 hr capsule Commonly known as: EFFEXOR-XR TAKE 1 CAPSULE BY MOUTH ONCE DAILY. TAKEIN ADDITION TO THE 150 MG CAPSULE FOR A TOTAL DOSE OF 225MG  DAILY   venlafaxine XR 150 MG 24 hr capsule Commonly known as: EFFEXOR-XR TAKE 1 CAPSULE BY MOUTH DAILY WITH BREAKFAST. TAKE WITH EFFEXOR XR 75MG  FORA TOTAL OF 225MG         Allergies:  Allergies  Allergen Reactions   Bupropion Nausea Only   Atorvastatin Other (See Comments)    Body aches Similar effect with rosuvastatin 40 mg twice weekly    Family History: Family History  Problem Relation Age of Onset   Alzheimer's disease Mother    Emphysema Mother    Diabetes Father    Heart disease Father         MI   Cancer Brother        ? Neck cancer    Social History:   reports that he has never smoked. He has never used smokeless tobacco. He reports that he does not drink alcohol and does not use drugs.  Physical Exam: BP 119/66   Pulse 91   Ht 5\' 6"  (1.676 m)   Wt 258 lb (117 kg)   BMI 41.64 kg/m   Constitutional:  Alert and oriented, no acute distress, nontoxic appearing HEENT: [place], AT Cardiovascular: No clubbing, cyanosis, or edema Respiratory: Normal respiratory effort, no increased work of breathing Skin: No rashes, bruises or suspicious  lesions Neurologic: Grossly intact, no focal deficits, moving all 4 extremities Psychiatric: Normal mood and affect  Laboratory Data: Results for orders placed or performed in visit on 06/10/22  PSA  Result Value Ref Range   Prostate Specific Ag, Serum 0.4 0.0 - 4.0 ng/mL  FSH/LH  Result Value Ref Range   LH 10.1 (H) 1.7 - 8.6 mIU/mL   FSH 8.0 1.5 - 12.4 mIU/mL  Testosterone  Result Value Ref Range   Testosterone 425 264 - 916 ng/dL  Prolactin  Result Value Ref Range   Prolactin 12.5 4.0 - 15.2 ng/mL   Assessment & Plan:   1. Acquired buried penis We discussed that PDE 5 inhibitors are not anticipated to help with this.  I continue to recommend weight loss.  He is doing an excellent job with sustainable dietary changes and has already noticed some improvement with modest weight loss so far.  I encouraged him to continue this.  2. Other fatigue Repeat testosterone WNL, no evidence of hypogonadism at this time.  I do not recommend testosterone supplementation.  I encouraged him to continue weight loss and exercise efforts as above.  3. Combined arterial insufficiency and corporo-venous occlusive erectile dysfunction He will require cardiac clearance prior to restarting PDE 5 inhibitors.  Given that his buried penis remains his primary complaint, will defer this for now but I asked him to return to clinic when he is ready and we can  pursue this at that time.  Return if symptoms worsen or fail to improve.  Debroah Loop, PA-C  [facility] [address] [place], [place] [address] [phone]

## 2022-06-17 ENCOUNTER — Telehealth: Payer: Self-pay

## 2022-06-17 NOTE — Chronic Care Management (AMB) (Signed)
Chronic Care Management Pharmacy Assistant   Name: [name]  MRN: [id number] DOB: 07-Feb-1951  Reason for Encounter: Reminder Call  Medications: Outpatient Encounter Medications as of 06/17/2022  Medication Sig   aspirin 81 MG EC tablet Take 1 tablet (81 mg total) by mouth daily.   carvedilol (COREG) 6.25 MG tablet TAKE 1 TABLET BY MOUTH ONCE DAILY   cholecalciferol (VITAMIN D3) 25 MCG (1000 UNIT) tablet Take 2,000 Units by mouth daily.   clopidogrel (PLAVIX) 75 MG tablet TAKE 1 TABLET BY MOUTH ONCE DAILY WITH BREAKFAST   Continuous Blood Gluc Receiver (FREESTYLE LIBRE 2 READER) DEVI Use with sensors to monitor sugar continuously   Continuous Blood Gluc Sensor (FREESTYLE LIBRE 2 SENSOR) MISC Apply sensor every 14 days to monitor sugar continously   Dulaglutide 4.5 MG/0.5ML SOPN Inject 4.5 mg into the skin once a week. Inject 4.5 mg once weekly   ezetimibe (ZETIA) 10 MG tablet TAKE 1 TABLET BY MOUTH ONCE DAILY   furosemide (LASIX) 40 MG tablet TAKE 1 TABLET BY MOUTH ONCE A DAY  CAN TAKE A 2ND DAILY DOSE AS NEEDED.   HYDROcodone-acetaminophen (NORCO) 5-325 MG tablet Take 1 tablet by mouth 2 (two) times daily as needed for moderate pain.   HYDROcodone-acetaminophen (NORCO/VICODIN) 5-325 MG tablet TAKE 1 TABLET BY MOUTH TWICE A DAY AS NEEDED FOR MODERATE PAIN   HYDROcodone-acetaminophen (NORCO/VICODIN) 5-325 MG tablet TAKE 1 TABLET BY MOUTH TWICE (2) DAILY AS NEEDED FOR MODERATE PAIN   hydrOXYzine (ATARAX) 10 MG tablet TAKE 1 TABLET BY MOUTH 3 TIMES DAILY AS NEEDED FOR ANXIETY   insulin aspart (NOVOLOG FLEXPEN) 100 UNIT/ML FlexPen 13 units in AM (scheduled) and 3 units PRN in evening   isosorbide mononitrate (IMDUR) 30 MG 24 hr tablet TAKE 1 TABLET BY MOUTH TWICE DAILY   losartan (COZAAR) 50 MG tablet TAKE 1 TABLET BY MOUTH ONCE DAILY   nitroGLYCERIN (NITROSTAT) 0.4 MG SL tablet DISSOLVE 1 TABLET UNDER TONGUE AS NEEDEDFOR CHEST PAIN. MAY REPEAT 5 MINUTES APART 3 TIMES IF NEEDED    TRESIBA FLEXTOUCH 100 UNIT/ML FlexTouch Pen INJECT 60 UNITS INTO THE SKIN DAILY   triamcinolone cream (KENALOG) 0.5 % Apply 1 Application topically 2 (two) times daily.   TRUEPLUS 5-BEVEL PEN NEEDLES 31G X 6 MM MISC USE TO INJECT INSULIN 3 TIMES A DAY   venlafaxine XR (EFFEXOR-XR) 150 MG 24 hr capsule TAKE 1 CAPSULE BY MOUTH DAILY WITH BREAKFAST. TAKE WITH EFFEXOR XR 75MG  FORA TOTAL OF 225MG    venlafaxine XR (EFFEXOR-XR) 75 MG 24 hr capsule TAKE 1 CAPSULE BY MOUTH ONCE DAILY. TAKEIN ADDITION TO THE 150 MG CAPSULE FOR A TOTAL DOSE OF 225MG  DAILY   vitamin B-12 (CYANOCOBALAMIN) 1000 MCG tablet Take 1,000 mcg by mouth daily.   No facility-administered encounter medications on file as of 06/17/2022.    [name] was contacted to remind of upcoming telephone visit with Charlene Brooke on 06/22/22 at 8:45. Patient was reminded to have any blood glucose and blood pressure readings available for review at appointment.   Patient confirmed appointment.  Are you having any problems with your medications? No   Do you have any concerns you like to discuss with the pharmacist? No  CCM referral has been placed prior to visit?  Yes   Star Rating Drugs: Medication:  Last Fill: Day Supply Trulicity   manufacturer Novolog  04/04/22  94 Losartan 50mg  03/31/22  90 Tresiba  04/29/22  [place], CPP notified  [employer]  [place], [place]  [phone]

## 2022-06-20 ENCOUNTER — Telehealth: Payer: Self-pay | Admitting: Family Medicine

## 2022-06-20 NOTE — Telephone Encounter (Signed)
Spoke with [name].  He states he was changed to [employer] 2 and he can't get the sensors to work.  He is asking if he can go back to the original [employer] sensors.  He states he never had issues with those.  Will forward message to [place] of recommendations.

## 2022-06-20 NOTE — Telephone Encounter (Signed)
Patient called and stated that his meter that check his blood sugar is not working. Call back number [phone].

## 2022-06-21 ENCOUNTER — Other Ambulatory Visit: Payer: Self-pay | Admitting: Cardiovascular Disease

## 2022-06-21 NOTE — Telephone Encounter (Signed)
Agree that Mendel Ryder may be able to help him with this.

## 2022-06-21 NOTE — Telephone Encounter (Signed)
Attempted to reach patient, unable to reach. Left VM on mobile to return call.  I am concerned he is using the incorrect reader for his Freestyle Libre 2 (if he tries to use 14-day reader with [place] 2 sensor, it will not work).   If he still has his original 14-day reader, we can go back to 14-day sensors. If he does not have the original reader anymore, he cannot get another one as they have been discontinued so he will have to continue with [facility] 2 sensor/reader.

## 2022-06-22 ENCOUNTER — Ambulatory Visit: Payer: HMO | Admitting: Pharmacist

## 2022-06-22 DIAGNOSIS — E1169 Type 2 diabetes mellitus with other specified complication: Secondary | ICD-10-CM

## 2022-06-22 DIAGNOSIS — E785 Hyperlipidemia, unspecified: Secondary | ICD-10-CM

## 2022-06-22 DIAGNOSIS — F32A Depression, unspecified: Secondary | ICD-10-CM

## 2022-06-22 DIAGNOSIS — I152 Hypertension secondary to endocrine disorders: Secondary | ICD-10-CM

## 2022-06-22 DIAGNOSIS — E1159 Type 2 diabetes mellitus with other circulatory complications: Secondary | ICD-10-CM

## 2022-06-22 DIAGNOSIS — E11319 Type 2 diabetes mellitus with unspecified diabetic retinopathy without macular edema: Secondary | ICD-10-CM

## 2022-06-22 NOTE — Patient Instructions (Signed)
Visit Information  Phone number for Pharmacist: [phone]   Goals Addressed   None     Care Plan : [place]  Updates made by Charlton Haws, RPH since 06/22/2022 12:00 AM     Problem: Hypertension, Hyperlipidemia, Diabetes, Heart Failure, Coronary Artery Disease, and Anxiety   Priority: High     Long-Range Goal: Disease mgmt   Start Date: 12/31/2020  Expected End Date: 12/21/2022  This Visit's Progress: On track  Recent Progress: On track  Priority: High  Note:   Current Barriers:  Due for lipid panel Hypoglycemia and suboptimal insulin use  Pharmacist Clinical Goal(s):  Patient will achieve control of diabetes as evidence by A1c and home BG monitoring through collaboration with PharmD and provider.   Interventions: 1:1 collaboration with Jinny Sanders, MD regarding development and update of comprehensive plan of care as evidenced by provider attestation and co-signature Inter-disciplinary care team collaboration (see longitudinal plan of care) Comprehensive medication review performed; medication list updated in electronic medical record  Diabetes (A1c goal <7%) -Not ideally Controlled - A1c 6.6% (04/2022) improved with weight loss, but now having some lows; he has reduced [place] down to 45 units and Novolog 10 units, but he will not take any insulin until sugar readings are above 120 -Not usually using Novolog at night anymore -Weight loss: 10/2021 283#, now 258# -Current home glucose readings - using Elenor Legato (reader) - he is not sure how to navigate reader to show averages, GMI, etc. No readings available right now as sensor/reader is malfunctioning, unable to connect sensor -Current medications: Trulicity 4.5 mg weekly (PAP) - Appropriate, Effective, Safe, Accessible Tresiba - 45 units AM -Appropriate, Effective, Safe, Accessible Novolog - 10 unit AM, 5u PM -Appropriate, Effective, Safe, Accessible Freestyle Libre 2 (reader)- Appropriate, Effective,  Safe, Accessible -Medications previously tried:  metformin -Reviewed rule of 15 for hypoglcyemia -Discussed Freestyle Libre issues - advised pt to contact manufacturer for replacement sensor and/or reader -Recommend in-office visit to download Freestyle Elenor Legato reports to better assess how to adjust insulin; he may be able to come off of Novolog and continue basal insulin alone; scheduled OV for 11/14 so he will have at least 2 weeks of CGM data to review  Hyperlipidemia / CAD (LDL goal < 70) -Query controlled - LDL 56 (10/2020); He stopped rosuvastatin July 2022. This is his second failed statin. He continues Zetia as prescribed. His cholesterol may have improved with weight loss and improved diabetes control this year. -Hx CAD (hx NSTEMI/PCI 2017) -Current treatment: Ezetimibe 10 mg daily -Appropriate, Query Effective Clopidogrel 75 mg daily -Appropriate, Effective, Safe, Accessible Aspirin 81 mg daily -Appropriate, Effective, Safe, Accessible Nitroglycerin 0.4 mg SL prn -Appropriate, Effective, Safe, Accessible -Medications previously tried: rosuvastatin 40 mg 2x weekly, atorvastatin (dose unknown, on allergy list since at least 2009) -Educated on Cholesterol goals;  -Recommended to continue current medication; recommend repeat lipid panel  Hypertension / Heart Failure (BP goal <140/90) -Controlled - BP at goal in office -Last ejection fraction: 60-65% (Date: 03/2020) -HF type: Diastolic -Current home BP readings: n/a - pt does not have meter -Current treatment: Carvedilol 6.25 mg daily -Appropriate, Effective, Safe, Accessible Furosemide 40 mg daily -Appropriate, Effective, Safe, Accessible Isosorbide [place] 30 mg BID -Appropriate, Effective, Safe, Accessible Losartan 50 mg daily -Appropriate, Effective, Safe, Accessible -Medications previously tried: n/a  -Denies hypotensive/hypertensive symptoms -Educated on BP goals and benefits of medications for prevention of heart attack, stroke and  kidney damage;Daily salt intake goal < 2300 mg; -  Counseled to monitor BP at home periodically -Recommended to continue current medication  Depression/Anxiety (Goal: manage symptoms) -Controlled - per pt report -PHQ9: 17 (10/2021) - moderately severe depression -GAD7: 14 (10/2021) - moderate anxiety -Connected with PCP for mental health support -Current treatment: Venlafaxine XR 150 mg daily -Appropriate, Effective, Safe, Accessible Venlafaxine XR 75 mg daily -Appropriate, Effective, Safe, Accessible Hydroxyzine 10 mg TID PRN - not taking -Medications previously tried/failed: bupropion (presumed SE) -Educated on Benefits of medication for symptom control -Recommended to continue current medication  Patient Goals/Self-Care Activities Patient will:  - continue to take medications as prescribed  -Contact PCP before stopping/changing a medication -Contact Freestyle for help with sensor/reader malfunctions      Patient verbalizes understanding of instructions and care plan provided today and agrees to view in MyChart. Active MyChart status and patient understanding of how to access instructions and care plan via MyChart confirmed with patient.    Telephone follow up appointment with pharmacy team member scheduled for: 3 weeks  Al Corpus, PharmD, BCACP Clinical Pharmacist [facility] [phone]

## 2022-06-22 NOTE — Telephone Encounter (Signed)
Called patient to make sure he had correct number for [facility]: [phone]. Patient wrote down the number and stated he would call.   Charlene Brooke, CPP notified  Marijean Niemann, [place] Clinical Pharmacy Assistant [phone]

## 2022-06-22 NOTE — Progress Notes (Signed)
Chronic Care Management Pharmacy Note  06/22/2022 Name:  [name] MRN:  [id number] DOB:  Dec 20, 1950  Summary: CCM F/U visit -DM: A1c 6.6% (04/2022) improved, but now having more lows; he has reduced [place] to 45 units and he will not take any insulin until glucose is > 120 -Freestyle Libre malfunctioning - pt cannot get sensor to connect to reader, unable to troubleshoot well over the phone as patient has poor eye sight and cannot navigate reader well -HLD/CAD: most recent LDL was 56 (10/2020) while patient was taking rosuvastatin, which he stopped in July 2022; he continues ezetimibe  Recommendations/Changes made from today's visit: -Advised pt to contact Freestyle for replacement sensor/reader -Scheduled office visit w/ PharmD to download CGM data -Recommend repeat lipid panel at upcoming PCP visit  Plan: -[facility] will call patient 1 week for Freestyle update -Pharmacist follow up televisit scheduled for 3 weeks -PCP appt 08/12/22    Subjective: [name] is an 71 y.o. year old male who is a primary patient of Bedsole, Amy E, MD.  The CCM team was consulted for assistance with disease management and care coordination needs.    Engaged with patient by telephone for follow up visit in response to provider referral for pharmacy case management and/or care coordination services.   Consent to Services:  The patient was given information about Chronic Care Management services, agreed to services, and gave verbal consent prior to initiation of services.  Please see initial visit note for detailed documentation.   Patient Care Team: Jinny Sanders, MD as PCP - General Rockey Situ Kathlene November, MD as Consulting Physician (Cardiology) Charlton Haws, [facility] as Pharmacist (Pharmacist)  Recent office visits: 05/12/22 Dr Diona Browner OV: A1c 6.6%, having lows. Reduce Tresiba to 57 units. Referral for sleep apnea and urology. Triamcinolone cream for rash.  03/29/22 NP Tabitha  Dugal OV: Strep and covid positive - rx Augmentin.  02/08/22 Dr Diona Browner OV: f/u DM - A1c 7.5%; consider holding PM dose of Novolog. Try 2 Tylenol middayu for breakthrough pain.  12/23/21 Dr Diona Browner OV: f/u DM - BG in 82s. Hold Novolog. Decrease Tresiba to 30 units.  11/16/21 Dr Diona Browner OV: Annual exam - DM foot exam done. Started bupropion 150 mg. F/U 4 weeks for PHQ and GAD.  11/05/21 Dr Diona Browner OV: f/u DM. Needs Trulicity PAP renewed.  Recent consult visits: 06/15/22 PA Debroah Loop (Urology): ED - PDE-5 inhibitors are not indicated. Continue wt loss. No evidence of hypogonadism. Return PRN.  05/18/22 Dr Erlene Quan (Urology): ED - discussed wt loss. ED not necessarily related to lower testosterone.   02/15/22 Dr Rockey Situ (Cardiology): f/u CHF, CAD. No changes. 02/17/21 Dr Rockey Situ (Cardiology): f/u CHF, CAD. No changes  Hospital visits: 09/15/21 ED Visit - chest pain. Resolved with NTG x 1 and extra lasix. EKG reassuring. Discharged home, encouraged to f/u with cardiology.   Objective:  Lab Results  Component Value Date   CREATININE 1.20 09/15/2021   BUN 20 09/15/2021   GFR 71.68 11/02/2020   GFRNONAA >60 09/15/2021   GFRAA >60 04/02/2020   NA 136 09/15/2021   K 3.7 09/15/2021   CALCIUM 8.6 (L) 09/15/2021   CO2 27 09/15/2021   GLUCOSE 300 (H) 09/15/2021    Lab Results  Component Value Date/Time   HGBA1C 6.6 (A) 05/12/2022 11:28 AM   HGBA1C 7.5 (A) 02/08/2022 10:52 AM   HGBA1C 7.7 (H) 11/02/2020 08:42 AM   HGBA1C 8.6 (H) 07/31/2020 08:41 AM   HGBA1C 9.6 10/02/2018  12:00 AM   GFR 71.68 11/02/2020 08:42 AM   GFR 76.14 07/31/2020 08:41 AM   MICROALBUR <0.7 02/21/2017 01:57 PM   MICROALBUR 0.8 02/16/2016 08:24 AM    Last diabetic Eye exam:  Lab Results  Component Value Date/Time   HMDIABEYEEXA Retinopathy (A) 01/26/2021 12:00 AM    Last diabetic Foot exam:  Lab Results  Component Value Date/Time   HMDIABFOOTEX done 11/01/2021 12:00 AM     Lab Results  Component  Value Date   CHOL 107 11/02/2020   HDL 34.30 (L) 11/02/2020   LDLCALC 56 11/02/2020   LDLDIRECT 174.0 10/19/2017   TRIG 82.0 11/02/2020   CHOLHDL 3 11/02/2020       Latest Ref Rng & Units 11/02/2020    8:42 AM 07/31/2020    8:41 AM 04/14/2020    8:34 AM  Hepatic Function  Total Protein 6.0 - 8.3 g/dL 7.4  6.7  7.2   Albumin 3.5 - 5.2 g/dL 3.9  3.9  4.0   AST 0 - 37 U/L 18  12  16    ALT 0 - 53 U/L 14  12  19    Alk Phosphatase 39 - 117 U/L 77  85  85   Total Bilirubin 0.2 - 1.2 mg/dL 0.4  0.4  0.3     Lab Results  Component Value Date/Time   TSH 2.64 10/19/2017 04:00 PM   TSH 1.14 03/11/2014 10:26 AM   FREET4 0.79 10/19/2017 04:00 PM       Latest Ref Rng & Units 09/15/2021    3:04 PM 07/31/2020    8:41 AM 03/31/2020    4:41 PM  CBC  WBC 4.0 - 10.5 K/uL 6.6  6.6  7.0   Hemoglobin 13.0 - 17.0 g/dL 12.1  13.2  13.4   Hematocrit 39.0 - 52.0 % 37.7  40.2  41.0   Platelets 150 - 400 K/uL 251  198.0  224     Lab Results  Component Value Date/Time   VD25OH 47.61 02/01/2022 07:52 AM   VD25OH 37.72 07/31/2020 08:41 AM    Clinical ASCVD: No  The ASCVD Risk score (Arnett DK, et al., 2019) failed to calculate for the following reasons:   The valid total cholesterol range is 130 to 320 mg/dL       11/16/2021    9:34 AM 11/11/2021    2:02 PM 11/05/2021    2:36 PM  Depression screen PHQ 2/9  Decreased Interest 2 0 0  Down, Depressed, Hopeless 3 0 2  PHQ - 2 Score 5 0 2  Altered sleeping 1  3  Tired, decreased energy 3  3  Change in appetite 0  0  Feeling bad or failure about yourself  3  2  Trouble concentrating 3  3  Moving slowly or fidgety/restless 2  0  Suicidal thoughts 0  0  PHQ-9 Score 17  13  Difficult doing work/chores Extremely dIfficult          11/16/2021    9:37 AM 11/05/2021    2:37 PM  GAD 7 : Generalized Anxiety Score  Nervous, Anxious, on Edge 2 3  Control/stop worrying 2 3  Worry too much - different things 3 0  Trouble relaxing 3 3  Restless 1 3   Easily annoyed or irritable 2 0  Afraid - awful might happen 1 0  Total GAD 7 Score 14 12  Anxiety Difficulty Extremely difficult      Social History   Tobacco Use  Smoking Status Never  Smokeless Tobacco Never   BP Readings from Last 3 Encounters:  06/15/22 119/66  05/18/22 (!) 103/53  05/12/22 130/64   Pulse Readings from Last 3 Encounters:  06/15/22 91  05/18/22 84  05/12/22 78   Wt Readings from Last 3 Encounters:  06/15/22 258 lb (117 kg)  05/12/22 267 lb (121.1 kg)  03/29/22 267 lb (121.1 kg)   BMI Readings from Last 3 Encounters:  06/15/22 41.64 kg/m  05/12/22 43.09 kg/m  03/29/22 43.09 kg/m    Assessment/Interventions: Review of patient past medical history, allergies, medications, health status, including review of consultants reports, laboratory and other test data, was performed as part of comprehensive evaluation and provision of chronic care management services.   SDOH:  (Social Determinants of Health) assessments and interventions performed: No SDOH Interventions    Flowsheet Row Office Visit from 11/16/2021 in [place] [facility] [place] from 11/10/2020 in [place] at [place] [facility] from 09/02/2020 in [place] at [facility] Patient Outreach Telephone from 01/30/2020 in [place] Patient Outreach Telephone from 02/18/2019 in [place] Patient Outreach Telephone from 02/05/2019 in [place] Interventions        Depression Interventions/Treatment  Currently on Treatment Currently on Treatment, PHQ2-9 Score <4 Follow-up Not Indicated -- Medication  [client agreeable to social work consult] Medication, Patient refuses Treatment Medication  Financial Strain Interventions -- -- Other (Comment)  [Manufacturer assistance program - Trulicity] -- -- --      SDOH Screenings   Food Insecurity: No Food Insecurity (11/11/2021)  Housing: [place]   (11/11/2021)  Transportation Needs: No Transportation Needs (11/11/2021)  Alcohol Screen: Low Risk  (11/11/2021)  Depression (PHQ2-9): High Risk (11/16/2021)  Financial Resource Strain: Low Risk  (11/11/2021)  Physical Activity: Inactive (11/11/2021)  Social Connections: Moderately Isolated (11/11/2021)  Stress: No Stress Concern Present (11/11/2021)  Tobacco Use: Low Risk  (06/15/2022)    CCM Care Plan  Allergies  Allergen Reactions   Bupropion Nausea Only   Atorvastatin Other (See Comments)    Body aches Similar effect with rosuvastatin 40 mg twice weekly    Medications Reviewed Today     Reviewed by Charlton Haws, [facility] (Pharmacist) on 06/22/22 at [place] List Status: <None>   Medication Order Taking? Sig Documenting Provider Last Dose Status Informant  aspirin 81 MG EC tablet [id number] Yes Take 1 tablet (81 mg total) by mouth daily. Theora Gianotti, NP Taking Active Self           Med Note Pilar Plate May 01, 2016  3:28 PM)    carvedilol (COREG) 6.25 MG tablet [id number] Yes TAKE 1 TABLET BY MOUTH ONCE DAILY Gollan, Kathlene November, MD Taking Active   cholecalciferol (VITAMIN D3) 25 MCG (1000 UNIT) tablet [id number] Yes Take 2,000 Units by mouth daily. [provider] Taking Active   clopidogrel (PLAVIX) 75 MG tablet [id number] Yes TAKE 1 TABLET BY MOUTH ONCE DAILY WITH BREAKFAST Bedsole, Amy E, MD Taking Active   Continuous Blood Gluc Receiver (FREESTYLE LIBRE 2 READER) DEVI [id number] Yes Use with sensors to monitor sugar continuously Diona Browner, Amy E, MD Taking Active   Continuous Blood Gluc Sensor (FREESTYLE LIBRE 2 SENSOR) MISC [id number] Yes Apply sensor every 14 days to monitor sugar continously Bedsole, Amy E, MD Taking Active   Dulaglutide 4.5 MG/0.5ML SOPN [id number] Yes Inject 4.5 mg into the skin once a week. Inject 4.5 mg  once weekly Jinny Sanders, MD Taking Active   ezetimibe (ZETIA) 10 MG tablet [id number] Yes TAKE 1 TABLET BY MOUTH ONCE DAILY  Gollan, Kathlene November, MD Taking Active   furosemide (LASIX) 40 MG tablet [id number] Yes TAKE 1 TABLET BY MOUTH ONCE A DAY  CAN TAKE A 2ND DAILY DOSE AS NEEDED. Jinny Sanders, MD Taking Active   HYDROcodone-acetaminophen (NORCO) 5-325 MG tablet [id number] Yes Take 1 tablet by mouth 2 (two) times daily as needed for moderate pain. Jinny Sanders, MD Taking Active   HYDROcodone-acetaminophen (NORCO/VICODIN) 5-325 MG tablet [id number] Yes TAKE 1 TABLET BY MOUTH TWICE A DAY AS NEEDED FOR MODERATE PAIN Bedsole, Amy E, MD Taking Active   HYDROcodone-acetaminophen (NORCO/VICODIN) 5-325 MG tablet [id number] Yes TAKE 1 TABLET BY MOUTH TWICE (2) DAILY AS NEEDED FOR MODERATE PAIN Bedsole, Amy E, MD Taking Active   hydrOXYzine (ATARAX) 10 MG tablet [id number] Yes TAKE 1 TABLET BY MOUTH 3 TIMES DAILY AS NEEDED FOR ANXIETY Bedsole, Amy E, MD Taking Active   insulin aspart (NOVOLOG FLEXPEN) 100 UNIT/ML FlexPen [id number] Yes 13 units in AM (scheduled) and 3 units PRN in evening  Patient taking differently: Inject into the skin. 10 units in AM   Bedsole, Amy E, MD Taking Active   isosorbide mononitrate (IMDUR) 30 MG 24 hr tablet [id number] Yes TAKE 1 TABLET BY MOUTH TWICE DAILY Gollan, Kathlene November, MD Taking Active   losartan (COZAAR) 50 MG tablet [id number] Yes TAKE 1 TABLET BY MOUTH ONCE DAILY Bedsole, Amy E, MD Taking Active   nitroGLYCERIN (NITROSTAT) 0.4 MG SL tablet [id number] Yes DISSOLVE 1 TABLET UNDER TONGUE AS NEEDEDFOR CHEST PAIN. MAY REPEAT 5 MINUTES APART 3 TIMES IF NEEDED Gollan, Kathlene November, MD Taking Active   TRESIBA FLEXTOUCH 100 UNIT/ML FlexTouch Pen [id number] Yes INJECT 60 UNITS INTO THE SKIN DAILY  Patient taking differently: Inject 45 Units into the skin daily.   Jinny Sanders, MD Taking Active   triamcinolone cream (KENALOG) 0.5 % [id number] Yes Apply 1 Application topically 2 (two) times daily. Jinny Sanders, MD Taking Active   TRUEPLUS 5-BEVEL PEN NEEDLES 31G X 6 MM MISC [id number] Yes USE TO INJECT INSULIN  3 TIMES A DAY Bedsole, Amy E, MD Taking Active   venlafaxine XR (EFFEXOR-XR) 150 MG 24 hr capsule [id number] Yes TAKE 1 CAPSULE BY MOUTH DAILY WITH BREAKFAST. TAKE WITH EFFEXOR XR 75MG FORA TOTAL OF 225MG Bedsole, Amy E, MD Taking Active   venlafaxine XR (EFFEXOR-XR) 75 MG 24 hr capsule [id number] Yes TAKE 1 CAPSULE BY MOUTH ONCE DAILY. TAKEIN ADDITION TO THE 150 MG CAPSULE FOR A TOTAL DOSE OF 225MG DAILY Bedsole, Amy E, MD Taking Active   vitamin B-12 (CYANOCOBALAMIN) 1000 MCG tablet [id number] Yes Take 1,000 mcg by mouth daily. [provider] Taking Active Self            Patient Active Problem List   Diagnosis Date Noted   Penis disorder 05/16/2022   Other fatigue 05/16/2022   Rash 05/12/2022   GAD (generalized anxiety disorder) 09/21/2021   Statin myopathy 03/30/2021   Chronic midline low back pain 11/12/2020   Hyperglycemia due to type 2 diabetes mellitus ([place]) 03/31/2020   ED (erectile dysfunction) 03/06/2018   Acute on chronic diastolic CHF (congestive heart failure) ([place]) 09/20/2016   Adjustment disorder with mixed anxiety and depressed mood 09/09/2016   Gastroesophageal reflux disease 09/09/2016   Elevated left ventricular end-diastolic pressure (LVEDP) 03/11/2016   Anemia 03/11/2016   Hypertensive heart disease with  heart failure ([place])    Diabetes mellitus type 2 with retinopathy ([place])    Coronary artery disease of native artery of native heart with stable angina pectoris ([place])    Morbid obesity ([place])    Chronic chest pain    Diabetic retinopathy ([place]) 09/23/2014   Snoring 12/21/2012   HYPOGONADISM 09/28/2010   B12 deficiency 09/28/2010   Vitamin D deficiency 09/28/2010   OTHER MALAISE AND FATIGUE 09/17/2010   TRIGGER FINGER, RIGHT MIDDLE 09/14/2010   BRANCH RETINAL VEIN OCCLUSION 11/26/2009   CONSTIPATION 08/10/2009   GASTRITIS 07/29/2009   Major depressive disorder, recurrent episode, moderate ([place]) 07/06/2007   RENAL CALCULUS, HX OF 03/26/2007    Hyperlipidemia associated with type 2 diabetes mellitus ([place]) 12/05/2006   Hypertension associated with diabetes ([place]) 12/05/2006   Osteoarthritis 12/05/2006    Immunization History  Administered Date(s) Administered   Fluad Quad(high Dose 65+) 05/23/2020, 05/12/2022   Influenza Split 05/12/2011, 07/09/2012   Influenza Whole 06/30/2007, 05/29/2008, 07/03/2009   Influenza, High Dose Seasonal PF 05/14/2019   Influenza,inj,Quad PF,6+ Mos 05/31/2013, 07/09/2014, 06/25/2015, 07/05/2016, 06/22/2017, 06/11/2018   PFIZER(Purple Top)SARS-COV-2 Vaccination 10/28/2019, 11/18/2019, 05/29/2020   Pneumococcal Conjugate-13 02/14/2015   Pneumococcal Polysaccharide-23 06/30/2007, 12/21/2012, 02/27/2018   Td 06/30/2007    Conditions to be addressed/monitored:  Hypertension, Hyperlipidemia, Diabetes, Heart Failure, Coronary Artery Disease, and Anxiety  Care Plan : [place]  Updates made by Charlton Haws, [place] since 06/22/2022 12:00 AM     Problem: Hypertension, Hyperlipidemia, Diabetes, Heart Failure, Coronary Artery Disease, and Anxiety   Priority: High     Long-Range Goal: Disease mgmt   Start Date: 12/31/2020  Expected End Date: 12/21/2022  This Visit's Progress: On track  Recent Progress: On track  Priority: High  Note:   Current Barriers:  Due for lipid panel Hypoglycemia and suboptimal insulin use  Pharmacist Clinical Goal(s):  Patient will achieve control of diabetes as evidence by A1c and home BG monitoring through collaboration with PharmD and provider.   Interventions: 1:1 collaboration with Jinny Sanders, MD regarding development and update of comprehensive plan of care as evidenced by provider attestation and co-signature Inter-disciplinary care team collaboration (see longitudinal plan of care) Comprehensive medication review performed; medication list updated in electronic medical record  Diabetes (A1c goal <7%) -Not ideally Controlled - A1c 6.6% (04/2022)  improved with weight loss, but now having some lows; he has reduced [place] down to 45 units and Novolog 10 units, but he will not take any insulin until sugar readings are above 120 -Not usually using Novolog at night anymore -Weight loss: 10/2021 283#, now 258# -Current home glucose readings - using Elenor Legato (reader) - he is not sure how to navigate reader to show averages, GMI, etc. No readings available right now as sensor/reader is malfunctioning, unable to connect sensor -Current medications: Trulicity 4.5 mg weekly (PAP) - Appropriate, Effective, Safe, Accessible Tresiba - 45 units AM -Appropriate, Effective, Safe, Accessible Novolog - 10 unit AM, 5u PM -Appropriate, Effective, Safe, Accessible Freestyle Libre 2 (reader)- Appropriate, Effective, Safe, Accessible -Medications previously tried:  metformin -Reviewed rule of 15 for hypoglcyemia -Discussed Freestyle Libre issues - advised pt to contact manufacturer for replacement sensor and/or reader -Recommend in-office visit to download Freestyle Elenor Legato reports to better assess how to adjust insulin; he may be able to come off of Novolog and continue basal insulin alone; scheduled OV for 11/14 so he will have at least 2 weeks of CGM data to review  Hyperlipidemia / CAD (LDL goal <  70) -Query controlled - LDL 56 (10/2020); He stopped rosuvastatin July 2022. This is his second failed statin. He continues Zetia as prescribed. His cholesterol may have improved with weight loss and improved diabetes control this year. -Hx CAD (hx NSTEMI/PCI 2017) -Current treatment: Ezetimibe 10 mg daily -Appropriate, Query Effective Clopidogrel 75 mg daily -Appropriate, Effective, Safe, Accessible Aspirin 81 mg daily -Appropriate, Effective, Safe, Accessible Nitroglycerin 0.4 mg SL prn -Appropriate, Effective, Safe, Accessible -Medications previously tried: rosuvastatin 40 mg 2x weekly, atorvastatin (dose unknown, on allergy list since at least 2009) -Educated on  Cholesterol goals;  -Recommended to continue current medication; recommend repeat lipid panel  Hypertension / Heart Failure (BP goal <140/90) -Controlled - BP at goal in office -Last ejection fraction: 60-65% (Date: 03/2020) -HF type: Diastolic -Current home BP readings: n/a - pt does not have meter -Current treatment: Carvedilol 6.25 mg daily -Appropriate, Effective, Safe, Accessible Furosemide 40 mg daily -Appropriate, Effective, Safe, Accessible Isosorbide MN 30 mg BID -Appropriate, Effective, Safe, Accessible Losartan 50 mg daily -Appropriate, Effective, Safe, Accessible -Medications previously tried: n/a  -Denies hypotensive/hypertensive symptoms -Educated on BP goals and benefits of medications for prevention of heart attack, stroke and kidney damage;Daily salt intake goal < 2300 mg; -Counseled to monitor BP at home periodically -Recommended to continue current medication  Depression/Anxiety (Goal: manage symptoms) -Controlled - per pt report -PHQ9: 17 (10/2021) - moderately severe depression -GAD7: 14 (10/2021) - moderate anxiety -Connected with PCP for mental health support -Current treatment: Venlafaxine XR 150 mg daily -Appropriate, Effective, Safe, Accessible Venlafaxine XR 75 mg daily -Appropriate, Effective, Safe, Accessible Hydroxyzine 10 mg TID PRN - not taking -Medications previously tried/failed: bupropion (presumed SE) -Educated on Benefits of medication for symptom control -Recommended to continue current medication  Patient Goals/Self-Care Activities Patient will:  - continue to take medications as prescribed  -Contact PCP before stopping/changing a medication -Contact Freestyle for help with sensor/reader malfunctions      Medication Assistance:  [place] approved through 08/28/22  Compliance/Adherence/Medication fill history: Care Gaps: None  Star-Rating Drugs: Losartan - PDC 100%  Medication Access: Within the past 30 days, how  often has patient missed a dose of medication? 0 Is a pillbox or other method used to improve adherence? Yes  Factors that may affect medication adherence? no barriers identified Are meds synced by current pharmacy? No  Are meds delivered by current pharmacy? Yes  Does patient experience delays in picking up medications due to transportation concerns? No   [facility] Services Reviewed: Is patient disadvantaged to use [facility]?: No  Current Rx insurance plan: HTA Name and location of Current pharmacy:  [place], [place] [address] [place] [address] Phone: [phone] Fax: [phone]  [facility] services reviewed with patient today?: No  Patient requests to transfer care to [facility]?: No  Reason patient declined to change pharmacies: Loyalty to other pharmacy/Patient preference  Care Plan and Follow Up Patient Decision:  Patient agrees to Care Plan and Follow-up.  Plan: Telephone follow up appointment with care management team member scheduled for:  3 weeks  Charlene Brooke, PharmD, BCACP Clinical Pharmacist [facility] at [facility] [phone]

## 2022-06-22 NOTE — Telephone Encounter (Signed)
Spoke with patient. See CCM encounter 06/22/22.

## 2022-06-23 ENCOUNTER — Other Ambulatory Visit: Payer: Self-pay | Admitting: Family Medicine

## 2022-06-23 DIAGNOSIS — E11319 Type 2 diabetes mellitus with unspecified diabetic retinopathy without macular edema: Secondary | ICD-10-CM

## 2022-06-30 ENCOUNTER — Other Ambulatory Visit: Payer: Self-pay | Admitting: Family Medicine

## 2022-06-30 ENCOUNTER — Institutional Professional Consult (permissible substitution): Payer: HMO | Admitting: Adult Health

## 2022-06-30 DIAGNOSIS — E11319 Type 2 diabetes mellitus with unspecified diabetic retinopathy without macular edema: Secondary | ICD-10-CM

## 2022-07-06 ENCOUNTER — Other Ambulatory Visit: Payer: Self-pay | Admitting: Family Medicine

## 2022-07-06 NOTE — Telephone Encounter (Signed)
Last office visit 05/12/22 for DM.  Last refilled 12/02/21 for #30 with 2 refills.  Next Appt: 08/12/22 for DM and Pain Management.

## 2022-07-07 ENCOUNTER — Telehealth: Payer: Self-pay

## 2022-07-07 NOTE — Chronic Care Management (AMB) (Signed)
Chronic Care Management Pharmacy Assistant   Name: [name]  MRN: [id number] DOB: 18-Jul-1951  Reason for Encounter: Reminder Call    Medications: Outpatient Encounter Medications as of 07/07/2022  Medication Sig   aspirin 81 MG EC tablet Take 1 tablet (81 mg total) by mouth daily.   carvedilol (COREG) 6.25 MG tablet TAKE 1 TABLET BY MOUTH ONCE DAILY   cholecalciferol (VITAMIN D3) 25 MCG (1000 UNIT) tablet Take 2,000 Units by mouth daily.   clopidogrel (PLAVIX) 75 MG tablet TAKE 1 TABLET BY MOUTH ONCE DAILY WITH BREAKFAST   Continuous Blood Gluc Receiver (FREESTYLE LIBRE 2 READER) DEVI USE WITH SENSORS TO MONITOR SUGAR CONTINUOUSLY   Continuous Blood Gluc Sensor (FREESTYLE LIBRE 2 SENSOR) MISC APPLY SENSOR EVERY 14 DAYS TO MONITOR SUGAR CONTINOUSLY   Dulaglutide 4.5 MG/0.5ML SOPN Inject 4.5 mg into the skin once a week. Inject 4.5 mg once weekly   ezetimibe (ZETIA) 10 MG tablet TAKE 1 TABLET BY MOUTH ONCE DAILY   furosemide (LASIX) 40 MG tablet TAKE 1 TABLET BY MOUTH ONCE A DAY  CAN TAKE A 2ND DAILY DOSE AS NEEDED.   HYDROcodone-acetaminophen (NORCO) 5-325 MG tablet Take 1 tablet by mouth 2 (two) times daily as needed for moderate pain.   HYDROcodone-acetaminophen (NORCO/VICODIN) 5-325 MG tablet TAKE 1 TABLET BY MOUTH TWICE A DAY AS NEEDED FOR MODERATE PAIN   HYDROcodone-acetaminophen (NORCO/VICODIN) 5-325 MG tablet TAKE 1 TABLET BY MOUTH TWICE (2) DAILY AS NEEDED FOR MODERATE PAIN   hydrOXYzine (ATARAX) 10 MG tablet TAKE 1 TABLET BY MOUTH 3 TIMES DAILY AS NEEDED FOR ANXIETY   insulin aspart (NOVOLOG FLEXPEN) 100 UNIT/ML FlexPen 13 units in AM (scheduled) and 3 units PRN in evening (Patient taking differently: Inject into the skin. 10 units in AM)   isosorbide mononitrate (IMDUR) 30 MG 24 hr tablet TAKE 1 TABLET BY MOUTH TWICE DAILY   losartan (COZAAR) 50 MG tablet TAKE 1 TABLET BY MOUTH ONCE DAILY   nitroGLYCERIN (NITROSTAT) 0.4 MG SL tablet DISSOLVE 1 TABLET UNDER TONGUE AS  NEEDEDFOR CHEST PAIN. MAY REPEAT 5 MINUTES APART 3 TIMES IF NEEDED   TRESIBA FLEXTOUCH 100 UNIT/ML FlexTouch Pen INJECT 60 UNITS INTO THE SKIN DAILY (Patient taking differently: Inject 45 Units into the skin daily.)   triamcinolone cream (KENALOG) 0.5 % Apply 1 Application topically 2 (two) times daily.   TRUEPLUS 5-BEVEL PEN NEEDLES 31G X 6 MM MISC USE TO INJECT INSULIN 3 TIMES A DAY   venlafaxine XR (EFFEXOR-XR) 150 MG 24 hr capsule TAKE 1 CAPSULE BY MOUTH DAILY WITH BREAKFAST. TAKE WITH EFFEXOR XR 75MG  FORA TOTAL OF 225MG    venlafaxine XR (EFFEXOR-XR) 75 MG 24 hr capsule TAKE 1 CAPSULE BY MOUTH ONCE DAILY. TAKEIN ADDITION TO THE 150 MG CAPSULE FOR A TOTAL DOSE OF 225MG  DAILY   vitamin B-12 (CYANOCOBALAMIN) 1000 MCG tablet Take 1,000 mcg by mouth daily.   No facility-administered encounter medications on file as of 07/07/2022.    was contacted to remind of upcoming office visit with on 07/12/22 at 2:15. Patient was reminded to have any blood glucose and blood pressure readings available for review at appointment.   Patient confirmed appointment.  Are you having any problems with your medications? No   Do you have any concerns you like to discuss with the pharmacist? Yes Patient received new BG monitor system and will bring to office for appointment for training on system   CCM referral has been placed prior to  visit?  Yes   Star Rating Drugs: Medication:  Last Fill: Day Supply Dulaglutide   manufacturer Novolog  04/04/22  94 Losartan 50mg  03/31/22  90 Tresiba  04/29/22  [place], CPP notified  Avel Sensor, [place]  [phone]

## 2022-07-12 ENCOUNTER — Ambulatory Visit: Payer: HMO | Admitting: Pharmacist

## 2022-07-12 DIAGNOSIS — E11319 Type 2 diabetes mellitus with unspecified diabetic retinopathy without macular edema: Secondary | ICD-10-CM

## 2022-07-12 DIAGNOSIS — E1169 Type 2 diabetes mellitus with other specified complication: Secondary | ICD-10-CM

## 2022-07-12 NOTE — Progress Notes (Signed)
Chronic Care Management Pharmacy Note  07/12/2022 Name:  [name] MRN:  [id number] DOB:  Mar 07, 1951  Summary: CCM F/U visit -DM: A1c 6.6% (04/2022) improved, but having some lows in AM (4-9am); he has titrated [place] down to 38 units and only taking 8 units of Novolog in AM -Reviewed AGP report: 06/29/22 to 07/12/22. Sensor active: 87%  Time in range (70-180): 79% (goal > 70%)  High (180-250): 17%  Very high (>250): 1%  Low (< 70): 3% (goal < 4%)  GMI: 6.6%; Average glucose: 139 -HLD/CAD: most recent LDL was 56 (10/2020) while patient was taking rosuvastatin, which he stopped in July 2022; he continues ezetimibe  Recommendations/Changes made from today's visit: -Recommend to reduce Tresiba to 38 units -Recommend repeat lipid panel at upcoming PCP visit  Plan: -Pharmacist follow up televisit scheduled for 6 weeks -PCP appt 08/12/22    Subjective: [name] is an 71 y.o. year old male who is a primary patient of Bedsole, Amy E, MD.  The CCM team was consulted for assistance with disease management and care coordination needs.    Engaged with patient by telephone for follow up visit in response to provider referral for pharmacy case management and/or care coordination services.   Consent to Services:  The patient was given information about Chronic Care Management services, agreed to services, and gave verbal consent prior to initiation of services.  Please see initial visit note for detailed documentation.   Patient Care Team: Jinny Sanders, MD as PCP - General Rockey Situ Kathlene November, MD as Consulting Physician (Cardiology) Charlton Haws, [facility] as Pharmacist (Pharmacist)  Recent office visits: 05/12/22 Dr Diona Browner OV: A1c 6.6%, having lows. Reduce Tresiba to 57 units. Referral for sleep apnea and urology. Triamcinolone cream for rash.  03/29/22 NP Tabitha Dugal OV: Strep and covid positive - rx Augmentin.  02/08/22 Dr Diona Browner OV: f/u DM - A1c 7.5%; consider holding PM  dose of Novolog. Try 2 Tylenol middayu for breakthrough pain.  12/23/21 Dr Diona Browner OV: f/u DM - BG in 20s. Hold Novolog. Decrease Tresiba to 30 units.  11/16/21 Dr Diona Browner OV: Annual exam - DM foot exam done. Started bupropion 150 mg. F/U 4 weeks for PHQ and GAD.  11/05/21 Dr Diona Browner OV: f/u DM. Needs Trulicity PAP renewed.  Recent consult visits: 06/15/22 PA Debroah Loop (Urology): ED - PDE-5 inhibitors are not indicated. Continue wt loss. No evidence of hypogonadism. Return PRN.  05/18/22 Dr Erlene Quan (Urology): ED - discussed wt loss. ED not necessarily related to lower testosterone.   02/15/22 Dr Rockey Situ (Cardiology): f/u CHF, CAD. No changes. 02/17/21 Dr Rockey Situ (Cardiology): f/u CHF, CAD. No changes  Hospital visits: 09/15/21 ED Visit - chest pain. Resolved with NTG x 1 and extra lasix. EKG reassuring. Discharged home, encouraged to f/u with cardiology.   Objective:  Lab Results  Component Value Date   CREATININE 1.20 09/15/2021   BUN 20 09/15/2021   GFR 71.68 11/02/2020   GFRNONAA >60 09/15/2021   GFRAA >60 04/02/2020   NA 136 09/15/2021   K 3.7 09/15/2021   CALCIUM 8.6 (L) 09/15/2021   CO2 27 09/15/2021   GLUCOSE 300 (H) 09/15/2021    Lab Results  Component Value Date/Time   HGBA1C 6.6 (A) 05/12/2022 11:28 AM   HGBA1C 7.5 (A) 02/08/2022 10:52 AM   HGBA1C 7.7 (H) 11/02/2020 08:42 AM   HGBA1C 8.6 (H) 07/31/2020 08:41 AM   HGBA1C 9.6 10/02/2018 12:00 AM   GFR 71.68 11/02/2020 08:42 AM  GFR 76.14 07/31/2020 08:41 AM   MICROALBUR <0.7 02/21/2017 01:57 PM   MICROALBUR 0.8 02/16/2016 08:24 AM    Last diabetic Eye exam:  Lab Results  Component Value Date/Time   HMDIABEYEEXA Retinopathy (A) 01/26/2021 12:00 AM    Last diabetic Foot exam:  Lab Results  Component Value Date/Time   HMDIABFOOTEX done 11/01/2021 12:00 AM     Lab Results  Component Value Date   CHOL 107 11/02/2020   HDL 34.30 (L) 11/02/2020   LDLCALC 56 11/02/2020   LDLDIRECT 174.0 10/19/2017    TRIG 82.0 11/02/2020   CHOLHDL 3 11/02/2020       Latest Ref Rng & Units 11/02/2020    8:42 AM 07/31/2020    8:41 AM 04/14/2020    8:34 AM  Hepatic Function  Total Protein 6.0 - 8.3 g/dL 7.4  6.7  7.2   Albumin 3.5 - 5.2 g/dL 3.9  3.9  4.0   AST 0 - 37 U/L _0 ALT 0 - 53 U/L _1 Alk Phosphatase 39 - 117 U/L 77  85  85   Total Bilirubin 0.2 - 1.2 mg/dL 0.4  0.4  0.3     Lab Results  Component Value Date/Time   TSH 2.64 10/19/2017 04:00 PM   TSH 1.14 03/11/2014 10:26 AM   FREET4 0.79 10/19/2017 04:00 PM       Latest Ref Rng & Units 09/15/2021    3:04 PM 07/31/2020    8:41 AM 03/31/2020    4:41 PM  CBC  WBC 4.0 - 10.5 K/uL 6.6  6.6  7.0   Hemoglobin 13.0 - 17.0 g/dL 12.1  13.2  13.4   Hematocrit 39.0 - 52.0 % 37.7  40.2  41.0   Platelets 150 - 400 K/uL 251  198.0  224     Lab Results  Component Value Date/Time   VD25OH 47.61 02/01/2022 07:52 AM   VD25OH 37.72 07/31/2020 08:41 AM    Clinical ASCVD: No  The ASCVD Risk score (Arnett DK, et al., 2019) failed to calculate for the following reasons:   The valid total cholesterol range is 130 to 320 mg/dL       11/16/2021    9:34 AM 11/11/2021    2:02 PM 11/05/2021    2:36 PM  Depression screen PHQ 2/9  Decreased Interest 2 0 0  Down, Depressed, Hopeless 3 0 2  PHQ - 2 Score 5 0 2  Altered sleeping 1  3  Tired, decreased energy 3  3  Change in appetite 0  0  Feeling bad or failure about yourself  3  2  Trouble concentrating 3  3  Moving slowly or fidgety/restless 2  0  Suicidal thoughts 0  0  PHQ-9 Score 17  13  Difficult doing work/chores Extremely dIfficult          11/16/2021    9:37 AM 11/05/2021    2:37 PM  GAD 7 : Generalized Anxiety Score  Nervous, Anxious, on Edge 2 3  Control/stop worrying 2 3  Worry too much - different things 3 0  Trouble relaxing 3 3  Restless 1 3  Easily annoyed or irritable 2 0  Afraid - awful might happen 1 0  Total GAD 7 Score 14 12  Anxiety Difficulty  Extremely difficult      Social History   Tobacco Use  Smoking Status Never  Smokeless Tobacco Never   BP Readings  from Last 3 Encounters:  06/15/22 119/66  05/18/22 (!) 103/53  05/12/22 130/64   Pulse Readings from Last 3 Encounters:  06/15/22 91  05/18/22 84  05/12/22 78   Wt Readings from Last 3 Encounters:  06/15/22 258 lb (117 kg)  05/12/22 267 lb (121.1 kg)  03/29/22 267 lb (121.1 kg)   BMI Readings from Last 3 Encounters:  06/15/22 41.64 kg/m  05/12/22 43.09 kg/m  03/29/22 43.09 kg/m    Assessment/Interventions: Review of patient past medical history, allergies, medications, health status, including review of consultants reports, laboratory and other test data, was performed as part of comprehensive evaluation and provision of chronic care management services.   SDOH:  (Social Determinants of Health) assessments and interventions performed: No SDOH Interventions    Flowsheet Row Office Visit from 11/16/2021 in [place] at [place] from 11/10/2020 in [place] at [place] Management from 09/02/2020 in [place] at [facility] Patient Outreach Telephone from 01/30/2020 in [place] Patient Outreach Telephone from 02/18/2019 in [place] Patient Outreach Telephone from 02/05/2019 in [place] Interventions        Depression Interventions/Treatment  Currently on Treatment Currently on Treatment, PHQ2-9 Score <4 Follow-up Not Indicated -- Medication  [client agreeable to social work consult] Medication, Patient refuses Treatment Medication  Financial Strain Interventions -- -- Other (Comment)  [Manufacturer assistance program - Trulicity] -- -- --      SDOH Screenings   Food Insecurity: No Food Insecurity (11/11/2021)  Housing: [place]  (11/11/2021)  Transportation Needs: No Transportation Needs (11/11/2021)  Alcohol Screen: Low Risk  (11/11/2021)  Depression  (PHQ2-9): High Risk (11/16/2021)  Financial Resource Strain: Low Risk  (11/11/2021)  Physical Activity: Inactive (11/11/2021)  Social Connections: Moderately Isolated (11/11/2021)  Stress: No Stress Concern Present (11/11/2021)  Tobacco Use: Low Risk  (06/15/2022)    CCM Care Plan  Allergies  Allergen Reactions   Bupropion Nausea Only   Atorvastatin Other (See Comments)    Body aches Similar effect with rosuvastatin 40 mg twice weekly    Medications Reviewed Today     Reviewed by Charlton Haws, [facility] (Pharmacist) on 07/20/22 at 1424  Med List Status: <None>   Medication Order Taking? Sig Documenting Provider Last Dose Status Informant  aspirin 81 MG EC tablet [id number] Yes Take 1 tablet (81 mg total) by mouth daily. Theora Gianotti, NP Taking Active Self           Med Note Pilar Plate May 01, 2016  3:28 PM)    carvedilol (COREG) 6.25 MG tablet [id number] Yes TAKE 1 TABLET BY MOUTH ONCE DAILY Gollan, Kathlene November, MD Taking Active   cholecalciferol (VITAMIN D3) 25 MCG (1000 UNIT) tablet [id number] Yes Take 2,000 Units by mouth daily. [provider] Taking Active   clopidogrel (PLAVIX) 75 MG tablet [id number] Yes TAKE 1 TABLET BY MOUTH ONCE DAILY WITH BREAKFAST Bedsole, Amy E, MD Taking Active   Continuous Blood Gluc Receiver (FREESTYLE LIBRE 2 READER) DEVI [id number] Yes USE WITH SENSORS TO MONITOR SUGAR CONTINUOUSLY Bedsole, Amy E, MD Taking Active   Continuous Blood Gluc Sensor (FREESTYLE LIBRE 2 SENSOR) MISC [id number] Yes APPLY SENSOR EVERY 14 DAYS TO MONITOR SUGAR CONTINOUSLY Bedsole, Amy E, MD Taking Active   Dulaglutide 4.5 MG/0.5ML SOPN [id number] Yes Inject 4.5 mg into the skin once a week. Inject 4.5 mg once weekly Jinny Sanders, MD Taking Active   ezetimibe (  ZETIA) 10 MG tablet [id number] Yes TAKE 1 TABLET BY MOUTH ONCE DAILY Gollan, Kathlene November, MD Taking Active   furosemide (LASIX) 40 MG tablet [id number] Yes TAKE 1 TABLET BY MOUTH ONCE A DAY  CAN  TAKE A 2ND DAILY DOSE AS NEEDED. Jinny Sanders, MD Taking Active   HYDROcodone-acetaminophen (NORCO) 5-325 MG tablet [id number] Yes Take 1 tablet by mouth 2 (two) times daily as needed for moderate pain. Jinny Sanders, MD Taking Active   HYDROcodone-acetaminophen (NORCO/VICODIN) 5-325 MG tablet [id number] Yes TAKE 1 TABLET BY MOUTH TWICE A DAY AS NEEDED FOR MODERATE PAIN Bedsole, Amy E, MD Taking Active   HYDROcodone-acetaminophen (NORCO/VICODIN) 5-325 MG tablet [id number] Yes TAKE 1 TABLET BY MOUTH TWICE (2) DAILY AS NEEDED FOR MODERATE PAIN Bedsole, Amy E, MD Taking Active   hydrOXYzine (ATARAX) 10 MG tablet [id number] Yes TAKE 1 TABLET BY MOUTH 3 TIMES DAILY AS NEEDED FOR ANXIETY Bedsole, Amy E, MD Taking Active   insulin aspart (NOVOLOG FLEXPEN) 100 UNIT/ML FlexPen [id number] Yes 13 units in AM (scheduled) and 3 units PRN in evening  Patient taking differently: Inject into the skin. 8 units in AM   Bedsole, Amy E, MD Taking Active   isosorbide mononitrate (IMDUR) 30 MG 24 hr tablet [id number] Yes TAKE 1 TABLET BY MOUTH TWICE DAILY Gollan, Kathlene November, MD Taking Active   losartan (COZAAR) 50 MG tablet [id number] Yes TAKE 1 TABLET BY MOUTH ONCE DAILY Bedsole, Amy E, MD Taking Active   nitroGLYCERIN (NITROSTAT) 0.4 MG SL tablet [id number] Yes DISSOLVE 1 TABLET UNDER TONGUE AS NEEDEDFOR CHEST PAIN. MAY REPEAT 5 MINUTES APART 3 TIMES IF NEEDED Gollan, Kathlene November, MD Taking Active   TRESIBA FLEXTOUCH 100 UNIT/ML FlexTouch Pen [id number] Yes INJECT 60 UNITS INTO THE SKIN DAILY  Patient taking differently: Inject 40 Units into the skin daily.   Jinny Sanders, MD Taking Active   triamcinolone cream (KENALOG) 0.5 % [id number] Yes Apply 1 Application topically 2 (two) times daily. Jinny Sanders, MD Taking Active   TRUEPLUS 5-BEVEL PEN NEEDLES 31G X 6 MM MISC [id number] Yes USE TO INJECT INSULIN 3 TIMES A DAY Bedsole, Amy E, MD Taking Active   venlafaxine XR (EFFEXOR-XR) 150 MG 24 hr capsule [id number] Yes TAKE 1  CAPSULE BY MOUTH DAILY WITH BREAKFAST. TAKE WITH EFFEXOR XR 75MG FORA TOTAL OF 225MG Bedsole, Amy E, MD Taking Active   venlafaxine XR (EFFEXOR-XR) 75 MG 24 hr capsule [id number] Yes TAKE 1 CAPSULE BY MOUTH ONCE DAILY. TAKEIN ADDITION TO THE 150 MG CAPSULE FOR A TOTAL DOSE OF 225MG DAILY Bedsole, Amy E, MD Taking Active   vitamin B-12 (CYANOCOBALAMIN) 1000 MCG tablet [id number] Yes Take 1,000 mcg by mouth daily. [provider] Taking Active Self            Patient Active Problem List   Diagnosis Date Noted   Penis disorder 05/16/2022   Other fatigue 05/16/2022   Rash 05/12/2022   GAD (generalized anxiety disorder) 09/21/2021   Statin myopathy 03/30/2021   Chronic midline low back pain 11/12/2020   Hyperglycemia due to type 2 diabetes mellitus ([place]) 03/31/2020   ED (erectile dysfunction) 03/06/2018   Acute on chronic diastolic CHF (congestive heart failure) ([place]) 09/20/2016   Adjustment disorder with mixed anxiety and depressed mood 09/09/2016   Gastroesophageal reflux disease 09/09/2016   Elevated left ventricular end-diastolic pressure (LVEDP) 03/11/2016   Anemia 03/11/2016   Hypertensive heart disease with heart failure ([place])    Diabetes mellitus type 2 with  retinopathy ([place])    Coronary artery disease of native artery of native heart with stable angina pectoris ([place])    Morbid obesity ([place])    Chronic chest pain    Diabetic retinopathy ([place]) 09/23/2014   Snoring 12/21/2012   HYPOGONADISM 09/28/2010   B12 deficiency 09/28/2010   Vitamin D deficiency 09/28/2010   OTHER MALAISE AND FATIGUE 09/17/2010   TRIGGER FINGER, RIGHT MIDDLE 09/14/2010   BRANCH RETINAL VEIN OCCLUSION 11/26/2009   CONSTIPATION 08/10/2009   GASTRITIS 07/29/2009   Major depressive disorder, recurrent episode, moderate ([place]) 07/06/2007   RENAL CALCULUS, HX OF 03/26/2007   Hyperlipidemia associated with type 2 diabetes mellitus ([place]) 12/05/2006   Hypertension associated with diabetes ([place])  12/05/2006   Osteoarthritis 12/05/2006    Immunization History  Administered Date(s) Administered   Fluad Quad(high Dose 65+) 05/23/2020, 05/12/2022   Influenza Split 05/12/2011, 07/09/2012   Influenza Whole 06/30/2007, 05/29/2008, 07/03/2009   Influenza, High Dose Seasonal PF 05/14/2019   Influenza,inj,Quad PF,6+ Mos 05/31/2013, 07/09/2014, 06/25/2015, 07/05/2016, 06/22/2017, 06/11/2018   PFIZER(Purple Top)SARS-COV-2 Vaccination 10/28/2019, 11/18/2019, 05/29/2020   Pneumococcal Conjugate-13 02/14/2015   Pneumococcal Polysaccharide-23 06/30/2007, 12/21/2012, 02/27/2018   Td 06/30/2007    Conditions to be addressed/monitored:  Hypertension, Hyperlipidemia, Diabetes, Heart Failure, Coronary Artery Disease, and Anxiety  Care Plan : [place]  Updates made by Charlton Haws, [place] since 07/20/2022 12:00 AM     Problem: Hypertension, Hyperlipidemia, Diabetes, Heart Failure, Coronary Artery Disease, and Anxiety   Priority: High     Long-Range Goal: Disease mgmt   Start Date: 12/31/2020  Expected End Date: 12/21/2022  Recent Progress: On track  Priority: High  Note:   Current Barriers:  Due for lipid panel Hypoglycemia and suboptimal insulin use  Pharmacist Clinical Goal(s):  Patient will achieve control of diabetes as evidence by A1c and home BG monitoring through collaboration with PharmD and provider.   Interventions: 1:1 collaboration with Jinny Sanders, MD regarding development and update of comprehensive plan of care as evidenced by provider attestation and co-signature Inter-disciplinary care team collaboration (see longitudinal plan of care) Comprehensive medication review performed; medication list updated in electronic medical record  Diabetes (A1c goal <7%) -Controlled- A1c 6.6% (04/2022) improved with weight loss, he is not using Novolog at night and only takes 8 units in AM; he has also titrated [place] down to 40 units -Weight loss: 10/2021 283#,  now 258# -Current home glucose readings - using Libre (reader)  Reviewed AGP report: 06/29/22 to 07/12/22. Sensor active: 87%  Time in range (70-180): 79% (goal > 70%)  High (180-250): 17%  Very high (>250): 1%  Low (< 70): 3% (goal < 4%)  GMI: 6.6%; Average glucose: 139  -Current medications: Trulicity 4.5 mg weekly (PAP) - Appropriate, Effective, Safe, Accessible Tresiba - 40 units AM -Appropriate, Effective, Safe, Accessible Novolog - 8 units AM -Appropriate, Effective, Safe, Accessible Freestyle Libre 2 (reader)- Appropriate, Effective, Safe, Accessible -Medications previously tried:  metformin -Reviewed rule of 15 for hypoglcyemia -Recommend to reduce Tresiba to 38 units  Hyperlipidemia / CAD (LDL goal < 70) -Query controlled - LDL 56 (10/2020); He stopped rosuvastatin July 2022. This is his second failed statin. He continues Zetia as prescribed. His cholesterol may have improved with weight loss and improved diabetes control this year. -Hx CAD (hx NSTEMI/PCI 2017) -Current treatment: Ezetimibe 10 mg daily -Appropriate, Query Effective Clopidogrel 75 mg daily -Appropriate, Effective, Safe, Accessible Aspirin 81 mg daily -Appropriate, Effective, Safe, Accessible Nitroglycerin 0.4 mg SL prn -Appropriate,  Effective, Safe, Accessible -Medications previously tried: rosuvastatin 40 mg 2x weekly, atorvastatin (dose unknown, on allergy list since at least 2009) -Educated on Cholesterol goals;  -Recommended to continue current medication; recommend repeat lipid panel  Hypertension / Heart Failure (BP goal <140/90) -Controlled - BP at goal in office -Last ejection fraction: 60-65% (Date: 03/2020) -HF type: Diastolic -Current home BP readings: n/a - pt does not have meter -Current treatment: Carvedilol 6.25 mg daily -Appropriate, Effective, Safe, Accessible Furosemide 40 mg daily -Appropriate, Effective, Safe, Accessible Isosorbide MN 30 mg BID -Appropriate, Effective, Safe,  Accessible Losartan 50 mg daily -Appropriate, Effective, Safe, Accessible -Medications previously tried: n/a  -Denies hypotensive/hypertensive symptoms -Educated on BP goals and benefits of medications for prevention of heart attack, stroke and kidney damage;Daily salt intake goal < 2300 mg; -Counseled to monitor BP at home periodically -Recommended to continue current medication  Depression/Anxiety (Goal: manage symptoms) -Controlled - per pt report -PHQ9: 17 (10/2021) - moderately severe depression -GAD7: 14 (10/2021) - moderate anxiety -Connected with PCP for mental health support -Current treatment: Venlafaxine XR 150 mg daily -Appropriate, Effective, Safe, Accessible Venlafaxine XR 75 mg daily -Appropriate, Effective, Safe, Accessible Hydroxyzine 10 mg TID PRN - not taking -Medications previously tried/failed: bupropion (presumed SE) -Educated on Benefits of medication for symptom control -Recommended to continue current medication  Patient Goals/Self-Care Activities Patient will:  - continue to take medications as prescribed  -Contact PCP before stopping/changing a medication -Contact Freestyle for help with sensor/reader malfunctions       Medication Assistance:  [place] approved through 08/28/22  Compliance/Adherence/Medication fill history: Care Gaps: None  Star-Rating Drugs: Losartan - PDC 100%  Medication Access: Within the past 30 days, how often has patient missed a dose of medication? 0 Is a pillbox or other method used to improve adherence? Yes  Factors that may affect medication adherence? no barriers identified Are meds synced by current pharmacy? No  Are meds delivered by current pharmacy? Yes  Does patient experience delays in picking up medications due to transportation concerns? No   [facility] Services Reviewed: Is patient disadvantaged to use [facility]?: No  Current Rx insurance plan: HTA Name and location of Current  pharmacy:  [place], [place] [address] [place] [address] Phone: [phone] Fax: [phone]  [facility] services reviewed with patient today?: No  Patient requests to transfer care to [facility] Pharmacy?: No  Reason patient declined to change pharmacies: Loyalty to other pharmacy/Patient preference  Care Plan and Follow Up Patient Decision:  Patient agrees to Care Plan and Follow-up.  Plan: Telephone follow up appointment with care management team member scheduled for:  1 month  Charlene Brooke, PharmD, [facility] Clinical Pharmacist [facility] [phone]

## 2022-07-20 NOTE — Patient Instructions (Signed)
Visit Information  Phone number for Pharmacist: [phone]   Goals Addressed   None     Care Plan : CCM Pharmacy Care Plan  Updates made by Kathyrn Sheriff, RPH since 07/20/2022 12:00 AM     Problem: Hypertension, Hyperlipidemia, Diabetes, Heart Failure, Coronary Artery Disease, and Anxiety   Priority: High     Long-Range Goal: Disease mgmt   Start Date: 12/31/2020  Expected End Date: 12/21/2022  Recent Progress: On track  Priority: High  Note:   Current Barriers:  Due for lipid panel Hypoglycemia and suboptimal insulin use  Pharmacist Clinical Goal(s):  Patient will achieve control of diabetes as evidence by A1c and home BG monitoring through collaboration with PharmD and provider.   Interventions: 1:1 collaboration with [name], MD regarding development and update of comprehensive plan of care as evidenced by provider attestation and co-signature Inter-disciplinary care team collaboration (see longitudinal plan of care) Comprehensive medication review performed; medication list updated in electronic medical record  Diabetes (A1c goal <7%) -Controlled- A1c 6.6% (04/2022) improved with weight loss, he is not using Novolog at night and only takes 8 units in AM; he has also titrated [place] down to 40 units -Weight loss: 10/2021 283#, now 258# -Current home glucose readings - using Libre (reader)  Reviewed AGP report: 06/29/22 to 07/12/22. Sensor active: 87%  Time in range (70-180): 79% (goal > 70%)  High (180-250): 17%  Very high (>250): 1%  Low (< 70): 3% (goal < 4%)  GMI: 6.6%; Average glucose: 139  -Current medications: Trulicity 4.5 mg weekly (PAP) - Appropriate, Effective, Safe, Accessible Tresiba - 40 units AM -Appropriate, Effective, Safe, Accessible Novolog - 8 units AM -Appropriate, Effective, Safe, Accessible Freestyle Libre 2 (reader)- Appropriate, Effective, Safe, Accessible -Medications previously tried:  metformin -Reviewed rule of 15 for  hypoglcyemia -Recommend to reduce Tresiba to 38 units  Hyperlipidemia / CAD (LDL goal < 70) -Query controlled - LDL 56 (10/2020); He stopped rosuvastatin July 2022. This is his second failed statin. He continues Zetia as prescribed. His cholesterol may have improved with weight loss and improved diabetes control this year. -Hx CAD (hx NSTEMI/PCI 2017) -Current treatment: Ezetimibe 10 mg daily -Appropriate, Query Effective Clopidogrel 75 mg daily -Appropriate, Effective, Safe, Accessible Aspirin 81 mg daily -Appropriate, Effective, Safe, Accessible Nitroglycerin 0.4 mg SL prn -Appropriate, Effective, Safe, Accessible -Medications previously tried: rosuvastatin 40 mg 2x weekly, atorvastatin (dose unknown, on allergy list since at least 2009) -Educated on Cholesterol goals;  -Recommended to continue current medication; recommend repeat lipid panel  Hypertension / Heart Failure (BP goal <140/90) -Controlled - BP at goal in office -Last ejection fraction: 60-65% (Date: 03/2020) -HF type: Diastolic -Current home BP readings: n/a - pt does not have meter -Current treatment: Carvedilol 6.25 mg daily -Appropriate, Effective, Safe, Accessible Furosemide 40 mg daily -Appropriate, Effective, Safe, Accessible Isosorbide MN 30 mg BID -Appropriate, Effective, Safe, Accessible Losartan 50 mg daily -Appropriate, Effective, Safe, Accessible -Medications previously tried: n/a  -Denies hypotensive/hypertensive symptoms -Educated on BP goals and benefits of medications for prevention of heart attack, stroke and kidney damage;Daily salt intake goal < 2300 mg; -Counseled to monitor BP at home periodically -Recommended to continue current medication  Depression/Anxiety (Goal: manage symptoms) -Controlled - per pt report -PHQ9: 17 (10/2021) - moderately severe depression -GAD7: 14 (10/2021) - moderate anxiety -Connected with PCP for mental health support -Current treatment: Venlafaxine XR 150 mg daily  -Appropriate, Effective, Safe, Accessible Venlafaxine XR 75 mg daily -Appropriate, Effective, Safe, Accessible Hydroxyzine  10 mg TID PRN - not taking -Medications previously tried/failed: bupropion (presumed SE) -Educated on Benefits of medication for symptom control -Recommended to continue current medication  Patient Goals/Self-Care Activities Patient will:  - continue to take medications as prescribed  -Contact PCP before stopping/changing a medication -Contact Freestyle for help with sensor/reader malfunctions      Patient verbalizes understanding of instructions and care plan provided today and agrees to view in MyChart. Active MyChart status and patient understanding of how to access instructions and care plan via MyChart confirmed with patient.    Telephone follow up appointment with pharmacy team member scheduled for: 6 weeks  Al Corpus, PharmD, BCACP Clinical Pharmacist  Primary Care at [facility] [phone]

## 2022-08-12 ENCOUNTER — Ambulatory Visit: Payer: HMO | Admitting: Family Medicine

## 2022-08-18 ENCOUNTER — Ambulatory Visit (INDEPENDENT_AMBULATORY_CARE_PROVIDER_SITE_OTHER): Payer: HMO | Admitting: Family Medicine

## 2022-08-18 ENCOUNTER — Encounter: Payer: Self-pay | Admitting: Family Medicine

## 2022-08-18 VITALS — BP 140/60 | HR 89 | Temp 98.7°F | Ht 66.0 in | Wt 259.5 lb

## 2022-08-18 DIAGNOSIS — I152 Hypertension secondary to endocrine disorders: Secondary | ICD-10-CM

## 2022-08-18 DIAGNOSIS — G8929 Other chronic pain: Secondary | ICD-10-CM

## 2022-08-18 DIAGNOSIS — M545 Low back pain, unspecified: Secondary | ICD-10-CM | POA: Diagnosis not present

## 2022-08-18 DIAGNOSIS — G720 Drug-induced myopathy: Secondary | ICD-10-CM

## 2022-08-18 DIAGNOSIS — E11319 Type 2 diabetes mellitus with unspecified diabetic retinopathy without macular edema: Secondary | ICD-10-CM

## 2022-08-18 DIAGNOSIS — E1159 Type 2 diabetes mellitus with other circulatory complications: Secondary | ICD-10-CM | POA: Diagnosis not present

## 2022-08-18 DIAGNOSIS — Z794 Long term (current) use of insulin: Secondary | ICD-10-CM

## 2022-08-18 DIAGNOSIS — T466X5A Adverse effect of antihyperlipidemic and antiarteriosclerotic drugs, initial encounter: Secondary | ICD-10-CM

## 2022-08-18 LAB — POCT GLYCOSYLATED HEMOGLOBIN (HGB A1C): Hemoglobin A1C: 7.1 % — AB (ref 4.0–5.6)

## 2022-08-18 MED ORDER — HYDROCODONE-ACETAMINOPHEN 5-325 MG PO TABS: 1.0000 | ORAL_TABLET | Freq: Two times a day (BID) | ORAL | 0 refills | Status: DC | PRN

## 2022-08-18 MED ORDER — HYDROCODONE-ACETAMINOPHEN 5-325 MG PO TABS: ORAL_TABLET | ORAL | 0 refills | Status: DC

## 2022-08-18 NOTE — Assessment & Plan Note (Signed)
Chronic, well-controlled in office today  on Coreg 6.25 milligrams p.o. twice daily, losartan 50 mg p.o. daily

## 2022-08-18 NOTE — Patient Instructions (Addendum)
Limit sweets.  Take a bedtime small  healthy snack to avoid lows in morning Change the Novolog fasting acting before supper per sliding scale per the MODERATE DOSE  Take the Tresiba in AM.

## 2022-08-18 NOTE — Progress Notes (Signed)
Patient ID: [name], male    DOB: Oct 25, 1950, 71 y.o.   MRN: [id number]  This visit was conducted in person.  BP (!) 140/60   Pulse 89   Temp 98.7 F (37.1 C) (Oral)   Ht 5\' 6"  (1.676 m)   Wt 259 lb 8 oz (117.7 kg)   SpO2 98%   BMI 41.88 kg/m    CC:  Chief Complaint  Patient presents with   Diabetes   Pain Management    Subjective:   HPI: [name] is a 71 y.o. male presenting on 08/18/2022 for Diabetes and Pain Management   He did not do sleep apnea test.  Diabetes:  A1C today 7.1 on dulaglutide 4.5 mg once weekly, Tresiba 55 units daily and   units Novolog in AM per sliding scale . He is a little unclear as to the dose of 08/20/2022 he has been taking.  Per the last note in November from [name], pharmacist, she had recommended 38 units but he states that he has increase this back to 55 units once he was told that the lows  (40s) were likely happening due to pressure on his continuous glucose monitor.   Now moved CBG meter to back on arm to not put pressure on it.. still  dropping occ somewhat low if eats less at dinner 65-70... going high later in day, usually after sweets. Using medications without difficulties:some confusion of when to take meds. Hypoglycemic episodes: 65- 70   not as frequent as before Hyperglycemic episodes: high if eating  ice cream and bread.. up  to 300s Feet problems: Blood Sugars averaging: Seems to be having lows  in AM..Agios Therapon 65 in AM  FBS 110-125 usually.. CBG reports 75% in range eye exam within last year: yes  Wt Readings from Last 3 Encounters:  08/18/22 259 lb 8 oz (117.7 kg)  06/15/22 258 lb (117 kg)  05/12/22 267 lb (121.1 kg)     Summary from Pharmacist 06/2022:   Indication for chronic opioid: chronic low back pain Medication and dose: Hydrocodone/acetaminophen 5/325 1 tablet p.o. 2 times daily as needed # pills per month: 60 Last UDS date: May 12, 2022 Opioid Treatment Agreement signed (Y/N): 05/12/2022 Opioid  Treatment Agreement last reviewed with patient: yes  NCCSRS reviewed this encounter (include red flags):   No red flags      Relevant past medical, surgical, family and social history reviewed and updated as indicated. Interim medical history since our last visit reviewed. Allergies and medications reviewed and updated. Outpatient Medications Prior to Visit  Medication Sig Dispense Refill   aspirin 81 MG EC tablet Take 1 tablet (81 mg total) by mouth daily.     carvedilol (COREG) 6.25 MG tablet TAKE 1 TABLET BY MOUTH ONCE DAILY 90 tablet 2   cholecalciferol (VITAMIN D3) 25 MCG (1000 UNIT) tablet Take 2,000 Units by mouth daily.     clopidogrel (PLAVIX) 75 MG tablet TAKE 1 TABLET BY MOUTH ONCE DAILY WITH BREAKFAST 90 tablet 3   Continuous Blood Gluc Receiver (FREESTYLE LIBRE 2 READER) DEVI USE WITH SENSORS TO MONITOR SUGAR CONTINUOUSLY 1 each 0   Continuous Blood Gluc Sensor (FREESTYLE LIBRE 2 SENSOR) MISC APPLY SENSOR EVERY 14 DAYS TO MONITOR SUGAR CONTINOUSLY 2 each 5   Dulaglutide 4.5 MG/0.5ML SOPN Inject 4.5 mg into the skin once a week. Inject 4.5 mg once weekly 2 mL 0   ezetimibe (ZETIA) 10 MG tablet TAKE 1 TABLET BY MOUTH ONCE DAILY  30 tablet 8   furosemide (LASIX) 40 MG tablet TAKE 1 TABLET BY MOUTH ONCE A DAY  CAN TAKE A 2ND DAILY DOSE AS NEEDED. 180 tablet 1   HYDROcodone-acetaminophen (NORCO) 5-325 MG tablet Take 1 tablet by mouth 2 (two) times daily as needed for moderate pain. 60 tablet 0   HYDROcodone-acetaminophen (NORCO/VICODIN) 5-325 MG tablet TAKE 1 TABLET BY MOUTH TWICE A DAY AS NEEDED FOR MODERATE PAIN 60 tablet 0   HYDROcodone-acetaminophen (NORCO/VICODIN) 5-325 MG tablet TAKE 1 TABLET BY MOUTH TWICE (2) DAILY AS NEEDED FOR MODERATE PAIN 60 tablet 0   hydrOXYzine (ATARAX) 10 MG tablet TAKE 1 TABLET BY MOUTH 3 TIMES DAILY AS NEEDED FOR ANXIETY 30 tablet 2   insulin aspart (NOVOLOG FLEXPEN) 100 UNIT/ML FlexPen 13 units in AM (scheduled) and 3 units PRN in evening (Patient  taking differently: Inject into the skin. 8 units in AM) 15 mL 6   isosorbide mononitrate (IMDUR) 30 MG 24 hr tablet TAKE 1 TABLET BY MOUTH TWICE DAILY 180 tablet 1   losartan (COZAAR) 50 MG tablet TAKE 1 TABLET BY MOUTH ONCE DAILY 90 tablet 1   nitroGLYCERIN (NITROSTAT) 0.4 MG SL tablet DISSOLVE 1 TABLET UNDER TONGUE AS NEEDEDFOR CHEST PAIN. MAY REPEAT 5 MINUTES APART 3 TIMES IF NEEDED 25 tablet 3   TRESIBA FLEXTOUCH 100 UNIT/ML FlexTouch Pen INJECT 60 UNITS INTO THE SKIN DAILY (Patient taking differently: Inject 40 Units into the skin daily.) 15 mL 3   triamcinolone cream (KENALOG) 0.5 % Apply 1 Application topically 2 (two) times daily. 30 g 0   TRUEPLUS 5-BEVEL PEN NEEDLES 31G X 6 MM MISC USE TO INJECT INSULIN 3 TIMES A DAY 300 each 3   venlafaxine XR (EFFEXOR-XR) 150 MG 24 hr capsule TAKE 1 CAPSULE BY MOUTH DAILY WITH BREAKFAST. TAKE WITH EFFEXOR XR 75MG  FORA TOTAL OF 225MG  90 capsule 1   venlafaxine XR (EFFEXOR-XR) 75 MG 24 hr capsule TAKE 1 CAPSULE BY MOUTH ONCE DAILY. TAKEIN ADDITION TO THE 150 MG CAPSULE FOR A TOTAL DOSE OF 225MG  DAILY 90 capsule 1   vitamin B-12 (CYANOCOBALAMIN) 1000 MCG tablet Take 1,000 mcg by mouth daily.     No facility-administered medications prior to visit.     Per HPI unless specifically indicated in ROS section below Review of Systems  Constitutional:  Negative for fatigue and fever.  HENT:  Negative for ear pain.   Eyes:  Negative for pain.  Respiratory:  Negative for cough and shortness of breath.   Cardiovascular:  Negative for chest pain, palpitations and leg swelling.  Gastrointestinal:  Negative for abdominal pain.  Genitourinary:  Negative for dysuria.  Musculoskeletal:  Negative for arthralgias.  Neurological:  Negative for syncope, light-headedness and headaches.  Psychiatric/Behavioral:  Negative for dysphoric mood.    Objective:  BP (!) 140/60   Pulse 89   Temp 98.7 F (37.1 C) (Oral)   Ht 5\' 6"  (1.676 m)   Wt 259 lb 8 oz (117.7 kg)    SpO2 98%   BMI 41.88 kg/m   Wt Readings from Last 3 Encounters:  08/18/22 259 lb 8 oz (117.7 kg)  06/15/22 258 lb (117 kg)  05/12/22 267 lb (121.1 kg)      Physical Exam Constitutional:      Appearance: He is well-developed. He is obese.  HENT:     Head: Normocephalic.     Right Ear: Hearing normal.     Left Ear: Hearing normal.     Nose: Nose normal.  Neck:     Thyroid: No thyroid mass or thyromegaly.     Vascular: No carotid bruit.     Trachea: Trachea normal.  Cardiovascular:     Rate and Rhythm: Normal rate and regular rhythm.     Pulses: Normal pulses.     Heart sounds: Heart sounds not distant. No murmur heard.    No friction rub. No gallop.     Comments: No peripheral edema Pulmonary:     Effort: Pulmonary effort is normal. No respiratory distress.     Breath sounds: Normal breath sounds.  Skin:    General: Skin is warm and dry.     Findings: No rash.  Psychiatric:        Speech: Speech normal.        Behavior: Behavior normal.        Thought Content: Thought content normal.       Results for orders placed or performed in visit on 06/10/22  PSA  Result Value Ref Range   Prostate Specific Ag, Serum 0.4 0.0 - 4.0 ng/mL  FSH/LH  Result Value Ref Range   LH 10.1 (H) 1.7 - 8.6 mIU/mL   FSH 8.0 1.5 - 12.4 mIU/mL  Testosterone  Result Value Ref Range   Testosterone 425 264 - 916 ng/dL  Prolactin  Result Value Ref Range   Prolactin 12.5 4.0 - 15.2 ng/mL     COVID 19 screen:  No recent travel or known exposure to COVID19 The patient denies respiratory symptoms of COVID 19 at this time. The importance of social distancing was discussed today.   Assessment and Plan    Problem List Items Addressed This Visit     Hypertension associated with diabetes (HCC) (Chronic)    Chronic, well-controlled in office today  on Coreg 6.25 milligrams p.o. twice daily, losartan 50 mg p.o. daily       Chronic midline low back pain    Chronic, well-controlled using  Tylenol or oxycodone/acetaminophen 5/325 mg 1 to 2 tablets daily as needed for pain.  He does try to limit the narcotic use.  He denies any sedation or constipation side effect from this medication.  Refills for the next 3 months sent to the pharmacy. PDMP reviewed during this encounter.       Relevant Medications   HYDROcodone-acetaminophen (NORCO) 5-325 MG tablet   HYDROcodone-acetaminophen (NORCO/VICODIN) 5-325 MG tablet   HYDROcodone-acetaminophen (NORCO/VICODIN) 5-325 MG tablet   Diabetes mellitus type 2 with retinopathy (HCC) - Primary    Chronic, A1c trending up some but patient experiencing less lows. Discussed in detail timing of injections.  Recommend Tresiba in the morning but take fast acting NovoLog prior to supper following which he typically has his spikes up to 300. I have also encouraged him to eat a healthy bedtime snack to avoid dropping low in the morning. He will continue to follow his blood sugars and work on low carbohydrate diet.  He will continue maximum dose of Trulicity at 4.5  mg weekly.  Unfortunately, he has not had any weight loss with this and we could consider changing it to Ozempic at the next office visit or with the pharmacist follow-up if he continues to have difficulty losing weight..      Relevant Orders   POCT glycosylated hemoglobin (Hb A1C) (Completed)   Other Visit Diagnoses     Encounter for chronic pain management            Kerby Nora, MD

## 2022-08-18 NOTE — Assessment & Plan Note (Signed)
Chronic, well-controlled using Tylenol or oxycodone/acetaminophen 5/325 mg 1 to 2 tablets daily as needed for pain.  He does try to limit the narcotic use.  He denies any sedation or constipation side effect from this medication.  Refills for the next 3 months sent to the pharmacy. PDMP reviewed during this encounter.

## 2022-08-18 NOTE — Assessment & Plan Note (Signed)
Chronic, A1c trending up some but patient experiencing less lows. Discussed in detail timing of injections.  Recommend Tresiba in the morning but take fast acting NovoLog prior to supper following which he typically has his spikes up to 300. I have also encouraged him to eat a healthy bedtime snack to avoid dropping low in the morning. He will continue to follow his blood sugars and work on low carbohydrate diet.  He will continue maximum dose of Trulicity at 4.5  mg weekly.  Unfortunately, he has not had any weight loss with this and we could consider changing it to Ozempic at the next office visit or with the pharmacist follow-up if he continues to have difficulty losing weight.[name]

## 2022-08-19 NOTE — Assessment & Plan Note (Signed)
Intolerant of statins

## 2022-08-31 ENCOUNTER — Telehealth: Payer: Self-pay

## 2022-08-31 NOTE — Progress Notes (Addendum)
Reason for Encounter: Appointment Reminder  Contacted patient on 08/31/2022   Hospital visits:  None in previous 6 months  Medications: Outpatient Encounter Medications as of 08/31/2022  Medication Sig   aspirin 81 MG EC tablet Take 1 tablet (81 mg total) by mouth daily.   carvedilol (COREG) 6.25 MG tablet TAKE 1 TABLET BY MOUTH ONCE DAILY   cholecalciferol (VITAMIN D3) 25 MCG (1000 UNIT) tablet Take 2,000 Units by mouth daily.   clopidogrel (PLAVIX) 75 MG tablet TAKE 1 TABLET BY MOUTH ONCE DAILY WITH BREAKFAST   Continuous Blood Gluc Receiver (FREESTYLE LIBRE 2 READER) DEVI USE WITH SENSORS TO MONITOR SUGAR CONTINUOUSLY   Continuous Blood Gluc Sensor (FREESTYLE LIBRE 2 SENSOR) MISC APPLY SENSOR EVERY 14 DAYS TO MONITOR SUGAR CONTINOUSLY   Dulaglutide 4.5 MG/0.5ML SOPN Inject 4.5 mg into the skin once a week. Inject 4.5 mg once weekly   ezetimibe (ZETIA) 10 MG tablet TAKE 1 TABLET BY MOUTH ONCE DAILY   furosemide (LASIX) 40 MG tablet TAKE 1 TABLET BY MOUTH ONCE A DAY  CAN TAKE A 2ND DAILY DOSE AS NEEDED.   HYDROcodone-acetaminophen (NORCO) 5-325 MG tablet Take 1 tablet by mouth 2 (two) times daily as needed for moderate pain.   HYDROcodone-acetaminophen (NORCO/VICODIN) 5-325 MG tablet TAKE 1 TABLET BY MOUTH TWICE (2) DAILY AS NEEDED FOR MODERATE PAIN   HYDROcodone-acetaminophen (NORCO/VICODIN) 5-325 MG tablet TAKE 1 TABLET BY MOUTH TWICE A DAY AS NEEDED FOR MODERATE PAIN   hydrOXYzine (ATARAX) 10 MG tablet TAKE 1 TABLET BY MOUTH 3 TIMES DAILY AS NEEDED FOR ANXIETY   insulin aspart (NOVOLOG FLEXPEN) 100 UNIT/ML FlexPen 13 units in AM (scheduled) and 3 units PRN in evening (Patient taking differently: Inject into the skin. 8 units in AM)   isosorbide mononitrate (IMDUR) 30 MG 24 hr tablet TAKE 1 TABLET BY MOUTH TWICE DAILY   losartan (COZAAR) 50 MG tablet TAKE 1 TABLET BY MOUTH ONCE DAILY   nitroGLYCERIN (NITROSTAT) 0.4 MG SL tablet DISSOLVE 1 TABLET UNDER TONGUE AS NEEDEDFOR CHEST PAIN. MAY  REPEAT 5 MINUTES APART 3 TIMES IF NEEDED   TRESIBA FLEXTOUCH 100 UNIT/ML FlexTouch Pen INJECT 60 UNITS INTO THE SKIN DAILY (Patient taking differently: Inject 40 Units into the skin daily.)   triamcinolone cream (KENALOG) 0.5 % Apply 1 Application topically 2 (two) times daily.   TRUEPLUS 5-BEVEL PEN NEEDLES 31G X 6 MM MISC USE TO INJECT INSULIN 3 TIMES A DAY   venlafaxine XR (EFFEXOR-XR) 150 MG 24 hr capsule TAKE 1 CAPSULE BY MOUTH DAILY WITH BREAKFAST. TAKE WITH EFFEXOR XR 75MG  FORA TOTAL OF 225MG    venlafaxine XR (EFFEXOR-XR) 75 MG 24 hr capsule TAKE 1 CAPSULE BY MOUTH ONCE DAILY. TAKEIN ADDITION TO THE 150 MG CAPSULE FOR A TOTAL DOSE OF 225MG  DAILY   vitamin B-12 (CYANOCOBALAMIN) 1000 MCG tablet Take 1,000 mcg by mouth daily.   No facility-administered encounter medications on file as of 08/31/2022.   Lab Results  Component Value Date/Time   HGBA1C 7.1 (A) 08/18/2022 04:06 PM   HGBA1C 6.6 (A) 05/12/2022 11:28 AM   HGBA1C 7.7 (H) 11/02/2020 08:42 AM   HGBA1C 8.6 (H) 07/31/2020 08:41 AM   HGBA1C 9.6 10/02/2018 12:00 AM   MICROALBUR <0.7 02/21/2017 01:57 PM   MICROALBUR 0.8 02/16/2016 08:24 AM    BP Readings from Last 3 Encounters:  08/18/22 (!) 140/60  06/15/22 119/66  05/18/22 (!) 103/53      Patient contacted to confirm telephone appointment with Charlene Brooke,   PharmD, on 09/02/2022  at 10:00.  Do you have any problems getting your medications? No   What is your top health concern you would like to discuss at your upcoming visit? Patient reports trying to loose weight   Have you seen any other providers since your last visit with PCP? No    Star Rating Drugs:  Medication:  Last Fill: Day Supply Trulicity 4.5mg    manufacturer Novolog Losartan 50mg  03/31/22  90 Tresiba   Care Gaps: Annual wellness visit in last year? Yes  If Diabetic: Last eye exam / retinopathy screening:2022 Last diabetic foot exam:UTD   Charlene Brooke, CPP notified  Avel Sensor,  [place]  [phone]

## 2022-09-02 ENCOUNTER — Ambulatory Visit: Payer: HMO | Admitting: Pharmacist

## 2022-09-02 NOTE — Progress Notes (Signed)
Care Management & Coordination Services Pharmacy Note  09/02/2022 Name:  [name] MRN:  [id number] DOB:  Apr 08, 1951  Summary: F/U visit -DM:  A1c 7.1% (07/2022) deteriorated slightly but he is having fewer lows now that he has moved CGM placement on his arm;  He reports using 50 units of Tresiba in AM and 3-5 units of Novolog with dinner. He reports he has lost about 6 more lbs since last discussion 07/12/22 (now 252 lbs) -Reviewed option to switch Trulicity to [employer] or Mounjaro for higher weight loss potential; he currently has 2.5 months remaining of Trulicity so does not want to switch right now, but he is open to switching in future; Mounjaro does not have PAP yet so Ozempic may be the only option; will call patient in ~6 weeks to determine if we will make the switch to Ozempic -CAD/HLD: LDL was 56 in 10/2020, but pt has been off statin since summer of 2022; cholesterol may have improved with wt loss, improved DM control  Recommendations/Changes made from today's visit: -No med changes today -Consider upgrading GLP-1 RA to Ozempic at next appt  -Recommend lipid panel at next OV  Follow up plan: -Pharmacist follow up televisit scheduled for 6 weeks -PCP appt 11/17/22    Subjective: [name] is an 72 y.o. year old male who is a primary patient of Bedsole, Amy E, MD.  The care coordination team was consulted for assistance with disease management and care coordination needs.    Engaged with patient by telephone for follow up visit.  Patient Care Team: Jinny Sanders, MD as PCP - General Rockey Situ Kathlene November, MD as Consulting Physician (Cardiology) Charlton Haws, [facility] as Pharmacist (Pharmacist)  Recent office visits: 08/18/22 Dr Diona Browner OV: f/u DM - A1c 7.1%. Consider changing Trulicity to Ozempic if no wt loss at f/u.  05/12/22 Dr Diona Browner OV: A1c 6.6%, having lows. Reduce Tresiba to 57 units. Referral for sleep apnea and urology. Triamcinolone cream for rash.    03/29/22 NP Tabitha Dugal OV: Strep and covid positive - rx Augmentin.   02/08/22 Dr Diona Browner OV: f/u DM - A1c 7.5%; consider holding PM dose of Novolog. Try 2 Tylenol middayu for breakthrough pain.  Recent consult visits: 06/15/22 PA Debroah Loop (Urology): ED - PDE-5 inhibitors are not indicated. Continue wt loss. No evidence of hypogonadism. Return PRN.   05/18/22 Dr Erlene Quan (Urology): ED - discussed wt loss. ED not necessarily related to lower testosterone.   Hospital visits: None in previous 6 months   Objective:  Lab Results  Component Value Date   CREATININE 1.20 09/15/2021   BUN 20 09/15/2021   GFR 71.68 11/02/2020   GFRNONAA >60 09/15/2021   GFRAA >60 04/02/2020   NA 136 09/15/2021   K 3.7 09/15/2021   CALCIUM 8.6 (L) 09/15/2021   CO2 27 09/15/2021   GLUCOSE 300 (H) 09/15/2021    Lab Results  Component Value Date/Time   HGBA1C 7.1 (A) 08/18/2022 04:06 PM   HGBA1C 6.6 (A) 05/12/2022 11:28 AM   HGBA1C 7.7 (H) 11/02/2020 08:42 AM   HGBA1C 8.6 (H) 07/31/2020 08:41 AM   HGBA1C 9.6 10/02/2018 12:00 AM   GFR 71.68 11/02/2020 08:42 AM   GFR 76.14 07/31/2020 08:41 AM   MICROALBUR <0.7 02/21/2017 01:57 PM   MICROALBUR 0.8 02/16/2016 08:24 AM    Last diabetic Eye exam:  Lab Results  Component Value Date/Time   HMDIABEYEEXA Retinopathy (A) 01/26/2021 12:00 AM    Last diabetic Foot exam:  Lab Results  Component Value Date/Time   HMDIABFOOTEX done 11/01/2021 12:00 AM     Lab Results  Component Value Date   CHOL 107 11/02/2020   HDL 34.30 (L) 11/02/2020   LDLCALC 56 11/02/2020   LDLDIRECT 174.0 10/19/2017   TRIG 82.0 11/02/2020   CHOLHDL 3 11/02/2020       Latest Ref Rng & Units 11/02/2020    8:42 AM 07/31/2020    8:41 AM 04/14/2020    8:34 AM  Hepatic Function  Total Protein 6.0 - 8.3 g/dL 7.4  6.7  7.2   Albumin 3.5 - 5.2 g/dL 3.9  3.9  4.0   AST 0 - 37 U/L 18  12  16    ALT 0 - 53 U/L 14  12  19    Alk Phosphatase 39 - 117 U/L 77  85  85   Total  Bilirubin 0.2 - 1.2 mg/dL 0.4  0.4  0.3     Lab Results  Component Value Date/Time   TSH 2.64 10/19/2017 04:00 PM   TSH 1.14 03/11/2014 10:26 AM   FREET4 0.79 10/19/2017 04:00 PM       Latest Ref Rng & Units 09/15/2021    3:04 PM 07/31/2020    8:41 AM 03/31/2020    4:41 PM  CBC  WBC 4.0 - 10.5 K/uL 6.6  6.6  7.0   Hemoglobin 13.0 - 17.0 g/dL 12.1  13.2  13.4   Hematocrit 39.0 - 52.0 % 37.7  40.2  41.0   Platelets 150 - 400 K/uL 251  198.0  224     Lab Results  Component Value Date/Time   VD25OH 47.61 02/01/2022 07:52 AM   VD25OH 37.72 07/31/2020 08:41 AM   VITAMINB12 355 02/01/2022 07:52 AM   VITAMINB12 111 (L) 11/02/2020 08:42 AM    Clinical ASCVD: Yes  The ASCVD Risk score (Arnett DK, et al., 2019) failed to calculate for the following reasons:   The valid total cholesterol range is 130 to 320 mg/dL       08/18/2022    3:52 PM 11/16/2021    9:34 AM 11/11/2021    2:02 PM  Depression screen PHQ 2/9  Decreased Interest 3 2 0  Down, Depressed, Hopeless 2 3 0  PHQ - 2 Score 5 5 0  Altered sleeping 3 1   Tired, decreased energy 3 3   Change in appetite 0 0   Feeling bad or failure about yourself  1 3   Trouble concentrating 1 3   Moving slowly or fidgety/restless 0 2   Suicidal thoughts 0 0   PHQ-9 Score 13 17   Difficult doing work/chores Somewhat difficult Extremely dIfficult        11/16/2021    9:37 AM 11/05/2021    2:37 PM  GAD 7 : Generalized Anxiety Score  Nervous, Anxious, on Edge 2 3  Control/stop worrying 2 3  Worry too much - different things 3 0  Trouble relaxing 3 3  Restless 1 3  Easily annoyed or irritable 2 0  Afraid - awful might happen 1 0  Total GAD 7 Score 14 12  Anxiety Difficulty Extremely difficult      Social History   Tobacco Use  Smoking Status Never  Smokeless Tobacco Never   BP Readings from Last 3 Encounters:  08/18/22 (!) 140/60  06/15/22 119/66  05/18/22 (!) 103/53   Pulse Readings from Last 3 Encounters:  08/18/22  89  06/15/22 91  05/18/22 84  Wt Readings from Last 3 Encounters:  08/18/22 259 lb 8 oz (117.7 kg)  06/15/22 258 lb (117 kg)  05/12/22 267 lb (121.1 kg)   BMI Readings from Last 3 Encounters:  08/18/22 41.88 kg/m  06/15/22 41.64 kg/m  05/12/22 43.09 kg/m    Allergies  Allergen Reactions   Bupropion Nausea Only   Atorvastatin Other (See Comments)    Body aches Similar effect with rosuvastatin 40 mg twice weekly    Medications Reviewed Today     Reviewed by Charlton Haws, [facility] (Pharmacist) on 09/02/22 at 1049  Med List Status: <None>   Medication Order Taking? Sig Documenting Provider Last Dose Status Informant  aspirin 81 MG EC tablet [id number] Yes Take 1 tablet (81 mg total) by mouth daily. Theora Gianotti, NP Taking Active Self           Med Note Pilar Plate May 01, 2016  3:28 PM)    carvedilol (COREG) 6.25 MG tablet [id number] Yes TAKE 1 TABLET BY MOUTH ONCE DAILY Gollan, Kathlene November, MD Taking Active   cholecalciferol (VITAMIN D3) 25 MCG (1000 UNIT) tablet [id number] Yes Take 2,000 Units by mouth daily. [provider] Taking Active   clopidogrel (PLAVIX) 75 MG tablet [id number] Yes TAKE 1 TABLET BY MOUTH ONCE DAILY WITH BREAKFAST Bedsole, Amy E, MD Taking Active   Continuous Blood Gluc Receiver (FREESTYLE LIBRE 2 READER) DEVI [id number] Yes USE WITH SENSORS TO MONITOR SUGAR CONTINUOUSLY Bedsole, Amy E, MD Taking Active   Continuous Blood Gluc Sensor (FREESTYLE LIBRE 2 SENSOR) MISC [id number] Yes APPLY SENSOR EVERY 14 DAYS TO MONITOR SUGAR CONTINOUSLY Bedsole, Amy E, MD Taking Active   Dulaglutide 4.5 MG/0.5ML SOPN [id number] Yes Inject 4.5 mg into the skin once a week. Inject 4.5 mg once weekly Bedsole, Amy E, MD Taking Active   ezetimibe (ZETIA) 10 MG tablet [id number] Yes TAKE 1 TABLET BY MOUTH ONCE DAILY Gollan, Kathlene November, MD Taking Active   furosemide (LASIX) 40 MG tablet [id number] Yes TAKE 1 TABLET BY MOUTH ONCE A DAY  CAN TAKE A 2ND  DAILY DOSE AS NEEDED. Jinny Sanders, MD Taking Active   HYDROcodone-acetaminophen (NORCO) 5-325 MG tablet [id number] Yes Take 1 tablet by mouth 2 (two) times daily as needed for moderate pain. Jinny Sanders, MD Taking Active   HYDROcodone-acetaminophen (NORCO/VICODIN) 5-325 MG tablet [id number] Yes TAKE 1 TABLET BY MOUTH TWICE (2) DAILY AS NEEDED FOR MODERATE PAIN Bedsole, Amy E, MD Taking Active   HYDROcodone-acetaminophen (NORCO/VICODIN) 5-325 MG tablet [id number] Yes TAKE 1 TABLET BY MOUTH TWICE A DAY AS NEEDED FOR MODERATE PAIN Bedsole, Amy E, MD Taking Active   hydrOXYzine (ATARAX) 10 MG tablet [id number] Yes TAKE 1 TABLET BY MOUTH 3 TIMES DAILY AS NEEDED FOR ANXIETY Bedsole, Amy E, MD Taking Active   insulin aspart (NOVOLOG FLEXPEN) 100 UNIT/ML FlexPen [id number] Yes 13 units in AM (scheduled) and 3 units PRN in evening  Patient taking differently: Inject into the skin. 3-5 units in PM   Bedsole, Amy E, MD Taking Active   isosorbide mononitrate (IMDUR) 30 MG 24 hr tablet [id number] Yes TAKE 1 TABLET BY MOUTH TWICE DAILY Gollan, Kathlene November, MD Taking Active   losartan (COZAAR) 50 MG tablet [id number] Yes TAKE 1 TABLET BY MOUTH ONCE DAILY Bedsole, Amy E, MD Taking Active   nitroGLYCERIN (NITROSTAT) 0.4 MG SL tablet [id number] Yes DISSOLVE 1 TABLET UNDER TONGUE AS NEEDEDFOR CHEST PAIN. MAY REPEAT 5 MINUTES APART 3 TIMES IF NEEDED Gollan,  Kathlene November, MD Taking Active   TRESIBA FLEXTOUCH 100 UNIT/ML FlexTouch Pen [id number] Yes INJECT 60 UNITS INTO THE SKIN DAILY  Patient taking differently: Inject 50 Units into the skin daily.   Jinny Sanders, MD Taking Active   triamcinolone cream (KENALOG) 0.5 % [id number] Yes Apply 1 Application topically 2 (two) times daily. Jinny Sanders, MD Taking Active   TRUEPLUS 5-BEVEL PEN NEEDLES 31G X 6 MM MISC [id number] Yes USE TO INJECT INSULIN 3 TIMES A DAY Bedsole, Amy E, MD Taking Active   venlafaxine XR (EFFEXOR-XR) 150 MG 24 hr capsule [id number] Yes TAKE 1 CAPSULE BY  MOUTH DAILY WITH BREAKFAST. TAKE WITH EFFEXOR XR 75MG  FORA TOTAL OF 225MG  Bedsole, Amy E, MD Taking Active   venlafaxine XR (EFFEXOR-XR) 75 MG 24 hr capsule [id number] Yes TAKE 1 CAPSULE BY MOUTH ONCE DAILY. TAKEIN ADDITION TO THE 150 MG CAPSULE FOR A TOTAL DOSE OF 225MG  DAILY Bedsole, Amy E, MD Taking Active   vitamin B-12 (CYANOCOBALAMIN) 1000 MCG tablet [id number] Yes Take 1,000 mcg by mouth daily. [provider] Taking Active Self            Patient Active Problem List   Diagnosis Date Noted   Penis disorder 05/16/2022   Other fatigue 05/16/2022   Rash 05/12/2022   GAD (generalized anxiety disorder) 09/21/2021   Statin myopathy 03/30/2021   Chronic midline low back pain 11/12/2020   Hyperglycemia due to type 2 diabetes mellitus ([place]) 03/31/2020   ED (erectile dysfunction) 03/06/2018   Acute on chronic diastolic CHF (congestive heart failure) ([place]) 09/20/2016   Adjustment disorder with mixed anxiety and depressed mood 09/09/2016   Gastroesophageal reflux disease 09/09/2016   Elevated left ventricular end-diastolic pressure (LVEDP) 03/11/2016   Anemia 03/11/2016   Hypertensive heart disease with heart failure ([place])    Diabetes mellitus type 2 with retinopathy ([place])    Coronary artery disease of native artery of native heart with stable angina pectoris (HCC)    Morbid obesity (HCC)    Chronic chest pain    Diabetic retinopathy ([place]) 09/23/2014   Snoring 12/21/2012   HYPOGONADISM 09/28/2010   B12 deficiency 09/28/2010   Vitamin D deficiency 09/28/2010   OTHER MALAISE AND FATIGUE 09/17/2010   TRIGGER FINGER, RIGHT MIDDLE 09/14/2010   BRANCH RETINAL VEIN OCCLUSION 11/26/2009   CONSTIPATION 08/10/2009   GASTRITIS 07/29/2009   Major depressive disorder, recurrent episode, moderate ([place]) 07/06/2007   RENAL CALCULUS, HX OF 03/26/2007   Hyperlipidemia associated with type 2 diabetes mellitus ([place]) 12/05/2006   Hypertension associated with diabetes ([place]) 12/05/2006    Osteoarthritis 12/05/2006    Immunization History  Administered Date(s) Administered   Fluad Quad(high Dose 65+) 05/23/2020, 05/12/2022   Influenza Split 05/12/2011, 07/09/2012   Influenza Whole 06/30/2007, 05/29/2008, 07/03/2009   Influenza, High Dose Seasonal PF 05/14/2019   Influenza,inj,Quad PF,6+ Mos 05/31/2013, 07/09/2014, 06/25/2015, 07/05/2016, 06/22/2017, 06/11/2018   PFIZER(Purple Top)SARS-COV-2 Vaccination 10/28/2019, 11/18/2019, 05/29/2020   Pneumococcal Conjugate-13 02/14/2015   Pneumococcal Polysaccharide-23 06/30/2007, 12/21/2012, 02/27/2018   Td 06/30/2007     Compliance/Adherence/Medication fill history: Care Gaps: Eye exam (01/26/22) UACR (02/21/18)  Star-Rating Drugs: None  SDOH:  (Social Determinants of Health) assessments and interventions performed: No SDOH Interventions    Flowsheet Row Office Visit from 08/18/2022 in [place] at [place] Office Visit from 11/16/2021 in [place] at [place] from 11/10/2020 in [place] at [place] Management from 09/02/2020 in [place] at [facility] Patient Outreach Telephone from  01/30/2020 in [place] Patient Outreach Telephone from 02/18/2019 in [place] Interventions        Depression Interventions/Treatment  Patient refuses Treatment Currently on Treatment Currently on Treatment, PHQ2-9 Score <4 Follow-up Not Indicated -- Medication  [client agreeable to social work consult] Medication, Patient refuses Treatment  Financial Strain Interventions -- -- -- Other (Comment)  [Manufacturer assistance program - Trulicity] -- --      SDOH Screenings   Food Insecurity: No Food Insecurity (11/11/2021)  Housing: Low Risk  (11/11/2021)  Transportation Needs: No Transportation Needs (11/11/2021)  Alcohol Screen: Low Risk  (11/11/2021)  Depression (PHQ2-9): High Risk (08/18/2022)  Financial Resource Strain: Low Risk   (11/11/2021)  Physical Activity: Inactive (11/11/2021)  Social Connections: Moderately Isolated (11/11/2021)  Stress: No Stress Concern Present (11/11/2021)  Tobacco Use: Low Risk  (08/18/2022)    Medication Assistance:  [place] approved 2023. May be switching to Ozempic 2024, will hold off on renewal paperwork for now.  Medication Access: Within the past 30 days, how often has patient missed a dose of medication? 0 Is a pillbox or other method used to improve adherence? Yes  Factors that may affect medication adherence? no barriers identified Are meds synced by current pharmacy? No  Are meds delivered by current pharmacy? No  Does patient experience delays in picking up medications due to transportation concerns? No   [facility]stream Services Reviewed: Is patient disadvantaged to use [facility]?: Yes  Current Rx insurance plan: HTA Name and location of Current pharmacy:  [place], [place] [address] [place] [address] Phone: [phone] Fax: [phone]  [facility] services reviewed with patient today?: No  Patient requests to transfer care to [facility] Pharmacy?: No  Reason patient declined to change pharmacies: Disadvantaged due to insurance/mail order   Assessment/Plan   Diabetes (A1c goal <7%) -Controlled- A1c 7.1% (07/2022) deteriorated slightly but he is having fewer lows;  He reports using 50 units of Tresiba in AM and 3-5 units of Novolog with dinner. He reports he has lost about 6 more lbs since last visit 07/12/22 -Weight loss: 10/2021 283#, now 252# -Current home glucose readings - using Libre (reader) -Current medications: Trulicity 4.5 mg weekly (PAP) - Appropriate, Effective, Safe, Accessible Tresiba - 50 units AM -Appropriate, Effective, Safe, Accessible Novolog - 3-5 units PM -Appropriate, Effective, Safe, Accessible Freestyle Libre 2 (reader)- Appropriate, Effective, Safe,  Accessible -Medications previously tried:  metformin -Reviewed rule of 15 for hypoglcyemia -Reviewed option to switch to Ozempic or Mounjaro for higher weight loss potential; he currently has 2.5 months remaining of Trulicity so does not want to switch right now, but he is open to switching in future; Mounjaro does not have PAP yet so Ozempic may be the only option; will call patient in ~6 weeks to determine if we will make the switch to Ozempic -Recommend to continue current medication for now  Hyperlipidemia / CAD (LDL goal < 70) -Query controlled - LDL 56 (10/2020); He stopped rosuvastatin July 2022. This is his second failed statin. He continues Zetia as prescribed. His cholesterol may have improved with weight loss and improved diabetes control this year. -Hx CAD (hx NSTEMI/PCI 2017) -Current treatment: Ezetimibe 10 mg daily -Appropriate, Query Effective Clopidogrel 75 mg daily -Appropriate, Effective, Safe, Accessible Aspirin 81 mg daily -Appropriate, Effective, Safe, Accessible Nitroglycerin 0.4 mg SL prn -Appropriate, Effective, Safe, Accessible -Medications previously tried: rosuvastatin 40 mg 2x weekly, atorvastatin (dose unknown, on  allergy list since at least 2009) -Educated on Cholesterol goals;  -Recommended to continue current medication; recommend repeat lipid panel  Hypertension / Heart Failure (BP goal <140/90) -Controlled - BP at goal in office -Last ejection fraction: 60-65% (Date: 03/2020) -HF type: Diastolic -Current home BP readings: n/a - pt does not have meter -Current treatment: Carvedilol 6.25 mg daily -Appropriate, Effective, Safe, Accessible Furosemide 40 mg daily -Appropriate, Effective, Safe, Accessible Isosorbide MN 30 mg BID -Appropriate, Effective, Safe, Accessible Losartan 50 mg daily -Appropriate, Effective, Safe, Accessible -Medications previously tried: n/a  -Denies hypotensive/hypertensive symptoms -Educated on BP goals and benefits of medications  for prevention of heart attack, stroke and kidney damage;Daily salt intake goal < 2300 mg; -Counseled to monitor BP at home periodically -Recommended to continue current medication  Depression/Anxiety (Goal: manage symptoms) -Controlled - per pt report -PHQ9: 17 (10/2021) - moderately severe depression -GAD7: 14 (10/2021) - moderate anxiety -Connected with PCP for mental health support -Current treatment: Venlafaxine XR 150 mg daily -Appropriate, Effective, Safe, Accessible Venlafaxine XR 75 mg daily -Appropriate, Effective, Safe, Accessible Hydroxyzine 10 mg TID PRN - not taking -Medications previously tried/failed: bupropion (presumed SE) -Educated on Benefits of medication for symptom control -Recommended to continue current medication  Charlene Brooke, PharmD, BCACP Clinical Pharmacist [facility] at [facility] [phone]

## 2022-09-02 NOTE — Patient Instructions (Signed)
Visit Information  Phone number for Pharmacist: [phone]  Thank you for meeting with me to discuss your medications! Below is a summary of what we talked about during the visit:   -No med changes today -Consider upgrading GLP-1 RA to Ozempic at next appt  -Recommend lipid panel at next OV  Follow up plan: -Pharmacist follow up televisit scheduled for 6 weeks -PCP appt 11/17/22   Charlene Brooke, PharmD, BCACP Clinical Pharmacist [name] at [facility] [phone]

## 2022-09-08 ENCOUNTER — Other Ambulatory Visit: Payer: Self-pay | Admitting: Family Medicine

## 2022-09-21 ENCOUNTER — Other Ambulatory Visit: Payer: Self-pay | Admitting: Cardiovascular Disease

## 2022-09-21 DIAGNOSIS — E113393 Type 2 diabetes mellitus with moderate nonproliferative diabetic retinopathy without macular edema, bilateral: Secondary | ICD-10-CM | POA: Diagnosis not present

## 2022-09-21 DIAGNOSIS — H35372 Puckering of macula, left eye: Secondary | ICD-10-CM | POA: Diagnosis not present

## 2022-09-21 DIAGNOSIS — H34833 Tributary (branch) retinal vein occlusion, bilateral, with macular edema: Secondary | ICD-10-CM | POA: Diagnosis not present

## 2022-09-30 ENCOUNTER — Other Ambulatory Visit: Payer: Self-pay | Admitting: Family Medicine

## 2022-10-03 ENCOUNTER — Other Ambulatory Visit: Payer: Self-pay | Admitting: Cardiovascular Disease

## 2022-10-03 ENCOUNTER — Other Ambulatory Visit: Payer: Self-pay | Admitting: Family Medicine

## 2022-10-04 ENCOUNTER — Telehealth: Payer: Self-pay | Admitting: Family Medicine

## 2022-10-04 NOTE — Telephone Encounter (Signed)
LVM for pt to rtn my call to schedule AWV with [facility].  

## 2022-10-04 NOTE — Telephone Encounter (Signed)
Patient scheduled.

## 2022-10-12 ENCOUNTER — Other Ambulatory Visit: Payer: Self-pay | Admitting: Family Medicine

## 2022-10-12 ENCOUNTER — Other Ambulatory Visit: Payer: Self-pay | Admitting: Cardiovascular Disease

## 2022-10-12 NOTE — Telephone Encounter (Signed)
Last office visit 08/18/22 for DM and Pain Management.  Last refilled 07/06/22 for #30 with 2 refills. Next Appt: 11/17/22 for DM.

## 2022-10-13 ENCOUNTER — Telehealth: Payer: Self-pay

## 2022-10-13 NOTE — Progress Notes (Signed)
Care Management & Coordination Services Pharmacy Team  Reason for Encounter: Appointment Reminder  Contacted patient to confirm telephone appointment with Charlene Brooke , PharmD on 10/18/22 at 11:45. Spoke with patient on 10/13/2022   Do you have any problems getting your medications? No   What is your top health concern you would like to discuss at your upcoming visit? Patient reports  fever improved, no issues with CGM  Have you seen any other providers since your last visit with PCP? No-   Hospital visits:  None in previous 6 months   Star Rating Drugs:  Medication:  Last Fill: Day Supply Trulicity   manufacturer Novolog  04/04/22  94 Tresiba   04/29/22  25 Losartan 33m 10/03/22  90   Verified picked up by pharmacy  Care Gaps: Annual wellness visit in last year? Yes  If Diabetic: Last eye exam / retinopathy screening:2022 Last diabetic foot exam:UTD   LCharlene Brooke PharmD notified  VAvel Sensor CMilamAssistant [phone]

## 2022-10-13 NOTE — Progress Notes (Signed)
Care Management & Coordination Services Pharmacy Note  10/18/2022 Name:  [name] MRN:  [id number] DOB:  1950/12/17  Summary: F/U visit -DM:  A1c 7.1% (07/2022) deteriorated slightly but he is having fewer lows now that he has moved CGM placement on his arm;  He reports using 52 units of Tresiba in AM, Novolog 12 units AM and 5 units PM. He has not lost any more weight since last month (253 lbs today) -Reviewed option to switch Trulicity to [employer] or Mounjaro for higher weight loss potential; he currently has 4 weeks remaining of Trulicity so does want to switch right now; Mounjaro does not have PAP yet so Ozempic may be the only option due to cost constraints -CAD/HLD: LDL was 56 in 10/2020, but pt has been off statin since summer of 2022; cholesterol may have improved with wt loss, improved DM control -Depression/grief: pt reports low energy, sleeping a lot, lack of motivation - worsening over time since death of his wife 6 years ago; he is fairly isolated and may benefit from counseling, he agreed to speak with LCSW over the phone  Recommendations/Changes made from today's visit: -Switch Trulicity 4.5 mg to Ozempic 2 mg - pursue PAP through [employer] (~1 month turnaround time) -Recommend lipid panel at next OV -Refer to LCSW for counseling  Follow up plan: -Pharmacist follow up televisit scheduled for 6 weeks -PCP appt 11/17/22    Subjective: [name] is an 72 y.o. year old male who is a primary patient of Bedsole, Amy E, MD.  The care coordination team was consulted for assistance with disease management and care coordination needs.    Engaged with patient by telephone for follow up visit.  Patient Care Team: Jinny Sanders, MD as PCP - General Rockey Situ Kathlene November, MD as Consulting Physician (Cardiology) Charlton Haws, [facility] as Pharmacist (Pharmacist)  Recent office visits: 08/18/22 Dr Diona Browner OV: f/u DM - A1c 7.1%. Consider changing Trulicity to Ozempic if no wt  loss at f/u.  05/12/22 Dr Diona Browner OV: A1c 6.6%, having lows. Reduce Tresiba to 57 units. Referral for sleep apnea and urology. Triamcinolone cream for rash.   03/29/22 NP Tabitha Dugal OV: Strep and covid positive - rx Augmentin.   02/08/22 Dr Diona Browner OV: f/u DM - A1c 7.5%; consider holding PM dose of Novolog. Try 2 Tylenol middayu for breakthrough pain.  Recent consult visits: 06/15/22 PA Debroah Loop (Urology): ED - PDE-5 inhibitors are not indicated. Continue wt loss. No evidence of hypogonadism. Return PRN.   05/18/22 Dr Erlene Quan (Urology): ED - discussed wt loss. ED not necessarily related to lower testosterone.   Hospital visits: None in previous 6 months   Objective:  Lab Results  Component Value Date   CREATININE 1.20 09/15/2021   BUN 20 09/15/2021   GFR 71.68 11/02/2020   GFRNONAA >60 09/15/2021   GFRAA >60 04/02/2020   NA 136 09/15/2021   K 3.7 09/15/2021   CALCIUM 8.6 (L) 09/15/2021   CO2 27 09/15/2021   GLUCOSE 300 (H) 09/15/2021    Lab Results  Component Value Date/Time   HGBA1C 7.1 (A) 08/18/2022 04:06 PM   HGBA1C 6.6 (A) 05/12/2022 11:28 AM   HGBA1C 7.7 (H) 11/02/2020 08:42 AM   HGBA1C 8.6 (H) 07/31/2020 08:41 AM   HGBA1C 9.6 10/02/2018 12:00 AM   GFR 71.68 11/02/2020 08:42 AM   GFR 76.14 07/31/2020 08:41 AM   MICROALBUR <0.7 02/21/2017 01:57 PM   MICROALBUR 0.8 02/16/2016 08:24 AM  Last diabetic Eye exam:  Lab Results  Component Value Date/Time   HMDIABEYEEXA Retinopathy (A) 01/26/2021 12:00 AM    Last diabetic Foot exam:  Lab Results  Component Value Date/Time   HMDIABFOOTEX done 11/01/2021 12:00 AM     Lab Results  Component Value Date   CHOL 107 11/02/2020   HDL 34.30 (L) 11/02/2020   LDLCALC 56 11/02/2020   LDLDIRECT 174.0 10/19/2017   TRIG 82.0 11/02/2020   CHOLHDL 3 11/02/2020       Latest Ref Rng & Units 11/02/2020    8:42 AM 07/31/2020    8:41 AM 04/14/2020    8:34 AM  Hepatic Function  Total Protein 6.0 - 8.3 g/dL 7.4   6.7  7.2   Albumin 3.5 - 5.2 g/dL 3.9  3.9  4.0   AST 0 - 37 U/L 18  12  16   $ ALT 0 - 53 U/L 14  12  19   $ Alk Phosphatase 39 - 117 U/L 77  85  85   Total Bilirubin 0.2 - 1.2 mg/dL 0.4  0.4  0.3     Lab Results  Component Value Date/Time   TSH 2.64 10/19/2017 04:00 PM   TSH 1.14 03/11/2014 10:26 AM   FREET4 0.79 10/19/2017 04:00 PM       Latest Ref Rng & Units 09/15/2021    3:04 PM 07/31/2020    8:41 AM 03/31/2020    4:41 PM  CBC  WBC 4.0 - 10.5 K/uL 6.6  6.6  7.0   Hemoglobin 13.0 - 17.0 g/dL 12.1  13.2  13.4   Hematocrit 39.0 - 52.0 % 37.7  40.2  41.0   Platelets 150 - 400 K/uL 251  198.0  224     Lab Results  Component Value Date/Time   VD25OH 47.61 02/01/2022 07:52 AM   VD25OH 37.72 07/31/2020 08:41 AM   VITAMINB12 355 02/01/2022 07:52 AM   VITAMINB12 111 (L) 11/02/2020 08:42 AM    Clinical ASCVD: Yes  The ASCVD Risk score (Arnett DK, et al., 2019) failed to calculate for the following reasons:   The valid total cholesterol range is 130 to 320 mg/dL       08/18/2022    3:52 PM 11/16/2021    9:34 AM 11/11/2021    2:02 PM  Depression screen PHQ 2/9  Decreased Interest 3 2 0  Down, Depressed, Hopeless 2 3 0  PHQ - 2 Score 5 5 0  Altered sleeping 3 1   Tired, decreased energy 3 3   Change in appetite 0 0   Feeling bad or failure about yourself  1 3   Trouble concentrating 1 3   Moving slowly or fidgety/restless 0 2   Suicidal thoughts 0 0   PHQ-9 Score 13 17   Difficult doing work/chores Somewhat difficult Extremely dIfficult        11/16/2021    9:37 AM 11/05/2021    2:37 PM  GAD 7 : Generalized Anxiety Score  Nervous, Anxious, on Edge 2 3  Control/stop worrying 2 3  Worry too much - different things 3 0  Trouble relaxing 3 3  Restless 1 3  Easily annoyed or irritable 2 0  Afraid - awful might happen 1 0  Total GAD 7 Score 14 12  Anxiety Difficulty Extremely difficult      Social History   Tobacco Use  Smoking Status Never  Smokeless Tobacco  Never   BP Readings from Last 3 Encounters:  08/18/22 Marland Kitchen)  140/60  06/15/22 119/66  05/18/22 (!) 103/53   Pulse Readings from Last 3 Encounters:  08/18/22 89  06/15/22 91  05/18/22 84   Wt Readings from Last 3 Encounters:  08/18/22 259 lb 8 oz (117.7 kg)  06/15/22 258 lb (117 kg)  05/12/22 267 lb (121.1 kg)   BMI Readings from Last 3 Encounters:  08/18/22 41.88 kg/m  06/15/22 41.64 kg/m  05/12/22 43.09 kg/m    Allergies  Allergen Reactions   Bupropion Nausea Only   Atorvastatin Other (See Comments)    Body aches Similar effect with rosuvastatin 40 mg twice weekly    Medications Reviewed Today     Reviewed by Charlton Haws, [facility] (Pharmacist) on 10/18/22 at 1257  Med List Status: <None>   Medication Order Taking? Sig Documenting Provider Last Dose Status Informant  aspirin 81 MG EC tablet [id number] Yes Take 1 tablet (81 mg total) by mouth daily. Theora Gianotti, NP Taking Active Self           Med Note Pilar Plate May 01, 2016  3:28 PM)    carvedilol (COREG) 6.25 MG tablet [id number] Yes TAKE 1 TABLET BY MOUTH ONCE DAILY Gollan, Kathlene November, MD Taking Active   cholecalciferol (VITAMIN D3) 25 MCG (1000 UNIT) tablet [id number] Yes Take 2,000 Units by mouth daily. [provider] Taking Active   clopidogrel (PLAVIX) 75 MG tablet [id number] Yes TAKE 1 TABLET BY MOUTH ONCE DAILY WITH BREAKFAST Bedsole, Amy E, MD Taking Active   Continuous Blood Gluc Receiver (FREESTYLE LIBRE 2 READER) DEVI [id number] Yes USE WITH SENSORS TO MONITOR SUGAR CONTINUOUSLY Bedsole, Amy E, MD Taking Active   Continuous Blood Gluc Sensor (FREESTYLE LIBRE 2 SENSOR) MISC [id number] Yes APPLY SENSOR EVERY 14 DAYS TO MONITOR SUGAR CONTINOUSLY Bedsole, Amy E, MD Taking Active   Dulaglutide 4.5 MG/0.5ML SOPN [id number] Yes Inject 4.5 mg into the skin once a week. Inject 4.5 mg once weekly Bedsole, Amy E, MD Taking Active   ezetimibe (ZETIA) 10 MG tablet [id number] Yes TAKE 1  TABLET BY MOUTH ONCE DAILY Gollan, Kathlene November, MD Taking Active   furosemide (LASIX) 40 MG tablet [id number] Yes TAKE 1 TABLET BY MOUTH ONCE A DAY  CAN TAKE A 2ND DAILY DOSE AS NEEDED. Jinny Sanders, MD Taking Active   HYDROcodone-acetaminophen (NORCO) 5-325 MG tablet [id number] Yes Take 1 tablet by mouth 2 (two) times daily as needed for moderate pain. Jinny Sanders, MD Taking Active   HYDROcodone-acetaminophen (NORCO/VICODIN) 5-325 MG tablet [id number] Yes TAKE 1 TABLET BY MOUTH TWICE (2) DAILY AS NEEDED FOR MODERATE PAIN Bedsole, Amy E, MD Taking Active   HYDROcodone-acetaminophen (NORCO/VICODIN) 5-325 MG tablet [id number] Yes TAKE 1 TABLET BY MOUTH TWICE A DAY AS NEEDED FOR MODERATE PAIN Bedsole, Amy E, MD Taking Active   hydrOXYzine (ATARAX) 10 MG tablet [id number] Yes TAKE 1 TABLET BY MOUTH 3 TIMES DAILY AS NEEDED FOR ANXIETY Bedsole, Amy E, MD Taking Active   insulin aspart (NOVOLOG FLEXPEN) 100 UNIT/ML FlexPen [id number] Yes 13 units in AM (scheduled) and 3 units PRN in evening  Patient taking differently: Inject into the skin. 3-5 units in PM   Bedsole, Amy E, MD Taking Active   insulin degludec (TRESIBA FLEXTOUCH) 100 UNIT/ML FlexTouch Pen [id number] Yes Inject 50 Units into the skin daily. Jinny Sanders, MD Taking Active   isosorbide mononitrate (IMDUR) 30 MG 24 hr tablet [id number] Yes TAKE 1 TABLET BY MOUTH TWICE DAILY Gollan, Kathlene November, MD Taking  Active   losartan (COZAAR) 50 MG tablet [id number] Yes TAKE 1 TABLET BY MOUTH ONCE DAILY Bedsole, Amy E, MD Taking Active   nitroGLYCERIN (NITROSTAT) 0.4 MG SL tablet [id number] Yes DISSOLVE 1 TABLET UNDER TONGUE AS NEEDEDFOR CHEST PAIN. MAY REPEAT 5 MINUTES APART 3 TIMES IF NEEDED Gollan, Kathlene November, MD Taking Active   triamcinolone cream (KENALOG) 0.5 % [id number] Yes Apply 1 Application topically 2 (two) times daily. Jinny Sanders, MD Taking Active   TRUEPLUS 5-BEVEL PEN NEEDLES 31G X 6 MM MISC [id number] Yes USE TO INJECT INSULIN 3 TIMES A DAY Bedsole,  Amy E, MD Taking Active   venlafaxine XR (EFFEXOR-XR) 150 MG 24 hr capsule [id number] Yes TAKE 1 CAPSULE BY MOUTH DAILY WITH BREAKFAST. TAKE WITH EFFEXOR XR 75MG FORA TOTAL OF 225MG Bedsole, Amy E, MD Taking Active   venlafaxine XR (EFFEXOR-XR) 75 MG 24 hr capsule [id number] Yes TAKE 1 CAPSULE BY MOUTH ONCE DAILY. TAKEIN ADDITION TO THE 150 MG CAPSULE FOR A TOTAL DOSE OF 225MG DAILY Bedsole, Amy E, MD Taking Active   vitamin B-12 (CYANOCOBALAMIN) 1000 MCG tablet [id number] Yes Take 1,000 mcg by mouth daily. [provider] Taking Active Self            Patient Active Problem List   Diagnosis Date Noted   Penis disorder 05/16/2022   Other fatigue 05/16/2022   Rash 05/12/2022   GAD (generalized anxiety disorder) 09/21/2021   Statin myopathy 03/30/2021   Chronic midline low back pain 11/12/2020   Hyperglycemia due to type 2 diabetes mellitus ([place]) 03/31/2020   ED (erectile dysfunction) 03/06/2018   Acute on chronic diastolic CHF (congestive heart failure) ([place]) 09/20/2016   Adjustment disorder with mixed anxiety and depressed mood 09/09/2016   Gastroesophageal reflux disease 09/09/2016   Elevated left ventricular end-diastolic pressure (LVEDP) 03/11/2016   Anemia 03/11/2016   Hypertensive heart disease with heart failure ([place])    Diabetes mellitus type 2 with retinopathy ([place])    Coronary artery disease of native artery of native heart with stable angina pectoris (HCC)    Morbid obesity (HCC)    Chronic chest pain    Diabetic retinopathy ([place]) 09/23/2014   Snoring 12/21/2012   HYPOGONADISM 09/28/2010   B12 deficiency 09/28/2010   Vitamin D deficiency 09/28/2010   OTHER MALAISE AND FATIGUE 09/17/2010   TRIGGER FINGER, RIGHT MIDDLE 09/14/2010   BRANCH RETINAL VEIN OCCLUSION 11/26/2009   CONSTIPATION 08/10/2009   GASTRITIS 07/29/2009   Major depressive disorder, recurrent episode, moderate ([place]) 07/06/2007   RENAL CALCULUS, HX OF 03/26/2007   Hyperlipidemia associated with  type 2 diabetes mellitus ([place]) 12/05/2006   Hypertension associated with diabetes ([place]) 12/05/2006   Osteoarthritis 12/05/2006    Immunization History  Administered Date(s) Administered   Fluad Quad(high Dose 65+) 05/23/2020, 05/12/2022   Influenza Split 05/12/2011, 07/09/2012   Influenza Whole 06/30/2007, 05/29/2008, 07/03/2009   Influenza, High Dose Seasonal PF 05/14/2019   Influenza,inj,Quad PF,6+ Mos 05/31/2013, 07/09/2014, 06/25/2015, 07/05/2016, 06/22/2017, 06/11/2018   PFIZER(Purple Top)SARS-COV-2 Vaccination 10/28/2019, 11/18/2019, 05/29/2020   Pneumococcal Conjugate-13 02/14/2015   Pneumococcal Polysaccharide-23 06/30/2007, 12/21/2012, 02/27/2018   Td 06/30/2007    SDOH:  (Social Determinants of Health) assessments and interventions performed: No SDOH Interventions    Flowsheet Row Office Visit from 08/18/2022 in [place] at [place] Office Visit from 11/16/2021 in [place] at [place] from 11/10/2020 in [place] at [place] Management from 09/02/2020 in [facility]  HealthCare at [facility] Patient Outreach Telephone from 01/30/2020 in [place] Patient Outreach Telephone from 02/18/2019 in [place] Interventions        Depression Interventions/Treatment  Patient refuses Treatment Currently on Treatment Currently on Treatment, PHQ2-9 Score <4 Follow-up Not Indicated -- Medication  [client agreeable to social work consult] Medication, Patient refuses Treatment  Financial Strain Interventions -- -- -- Other (Comment)  [Manufacturer assistance program - Trulicity] -- --      SDOH Screenings   Food Insecurity: No Food Insecurity (11/11/2021)  Housing: [place]  (11/11/2021)  Transportation Needs: No Transportation Needs (11/11/2021)  Alcohol Screen: Low Risk  (11/11/2021)  Depression (PHQ2-9): High Risk (08/18/2022)  Financial Resource  Strain: Low Risk  (11/11/2021)  Physical Activity: Inactive (11/11/2021)  Social Connections: Moderately Isolated (11/11/2021)  Stress: No Stress Concern Present (11/11/2021)  Tobacco Use: Low Risk  (08/18/2022)    Medication Assistance:  [place] approved 2023. May be switching to Ozempic 2024, will hold off on renewal paperwork for now.  Medication Access: Within the past 30 days, how often has patient missed a dose of medication? 0 Is a pillbox or other method used to improve adherence? Yes  Factors that may affect medication adherence? no barriers identified Are meds synced by current pharmacy? No  Are meds delivered by current pharmacy? No  Does patient experience delays in picking up medications due to transportation concerns? No   Upstream Services Reviewed: Is patient disadvantaged to use [facility]?: Yes  Current Rx insurance plan: HTA Name and location of Current pharmacy:  [facility], [place] [address] [place] [address] Phone: [phone] Fax: [phone]  [facility] services reviewed with patient today?: No  Patient requests to transfer care to [facility] Pharmacy?: No  Reason patient declined to change pharmacies: Disadvantaged due to insurance/mail order  Compliance/Adherence/Medication fill history: Care Gaps: Eye exam (01/26/22) UACR (02/21/18)  Star-Rating Drugs: None    ASSESSMENT / PLAN  Diabetes (A1c goal <7%) -Controlled- A1c 7.1% (07/2022) deteriorated slightly but he is having fewer lows;  He reports using 50 units of Tresiba in AM and 3-5 units of Novolog with dinner.  -Weight loss: 10/2021 283#, now 252# -Current home glucose readings - using Libre (reader)  Today: 109, 121 -Current medications: Trulicity 4.5 mg weekly (PAP) - Appropriate, Effective, Safe, Accessible Tresiba - 52 units AM -Appropriate, Effective, Safe, Accessible Novolog 12u AM, 5 uPM -Appropriate,  Effective, Safe, Accessible Freestyle Libre 2 (reader)- Appropriate, Effective, Safe, Accessible -Medications previously tried:  metformin -Reviewed rule of 15 for hypoglcyemia -Reviewed option to switch to Ozempic or Mounjaro for higher weight loss potential; he currently has 2.5 months remaining of Trulicity so does not want to switch right now, but he is open to switching in future; Mounjaro does not have PAP yet so Ozempic may be the only option -Recommend to switch Trulicity to Ozempic 2 mg - apply for PAP; consulting with PCP  Hyperlipidemia / CAD (LDL goal < 70) -Query controlled - LDL 56 (10/2020); He stopped rosuvastatin July 2022. This is his second failed statin. He continues Zetia as prescribed. His cholesterol may have improved with weight loss and improved diabetes control this year. -Hx CAD (hx NSTEMI/PCI 2017) -Current treatment: Ezetimibe 10 mg daily -Appropriate, Query Effective Clopidogrel 75 mg daily -Appropriate, Effective, Safe, Accessible Aspirin 81 mg daily -Appropriate, Effective, Safe, Accessible Nitroglycerin 0.4 mg SL prn -Appropriate, Effective, Safe, Accessible -Medications previously tried:  rosuvastatin 40 mg 2x weekly, atorvastatin (dose unknown, on allergy list since at least 2009) -Educated on Cholesterol goals;  -Recommended to continue current medication; recommend repeat lipid panel  Hypertension / Heart Failure (BP goal <140/90) -Controlled - BP at goal in office -Last ejection fraction: 60-65% (Date: 03/2020) -HF type: Diastolic -Current home BP readings: n/a - pt does not have meter -Current treatment: Carvedilol 6.25 mg daily -Appropriate, Effective, Safe, Accessible Furosemide 40 mg daily -Appropriate, Effective, Safe, Accessible Isosorbide MN 30 mg BID -Appropriate, Effective, Safe, Accessible Losartan 50 mg daily -Appropriate, Effective, Safe, Accessible -Medications previously tried: n/a  -Denies hypotensive/hypertensive symptoms -Educated on  BP goals and benefits of medications for prevention of heart attack, stroke and kidney damage;Daily salt intake goal < 2300 mg; -Counseled to monitor BP at home periodically -Recommended to continue current medication  Depression/Anxiety (Goal: manage symptoms) -Uncontrolled - per pt report -goes to bed at 4am, wakes up 2 pm -PHQ9: 17 (10/2021) - moderately severe depression -GAD7: 14 (10/2021) - moderate anxiety -Connected with PCP for mental health support -Current treatment: Venlafaxine XR 150 mg daily -Appropriate, Effective, Safe, Accessible Venlafaxine XR 75 mg daily -Appropriate, Effective, Safe, Accessible Hydroxyzine 10 mg TID PRN - not taking -Medications previously tried/failed: bupropion (presumed SE) -Educated on Benefits of medication for symptom control -Open to speaking to Care Coordination SW, sent message to [place] team -Recommended to continue current medication    Charlene Brooke, PharmD, BCACP Clinical Pharmacist [facility] at [facility] [phone]

## 2022-10-18 ENCOUNTER — Telehealth: Payer: Self-pay | Admitting: Pharmacist

## 2022-10-18 ENCOUNTER — Ambulatory Visit: Payer: HMO | Admitting: Pharmacist

## 2022-10-18 MED ORDER — SEMAGLUTIDE (2 MG/DOSE) 8 MG/3ML ~~LOC~~ SOPN: 2.0000 mg | PEN_INJECTOR | SUBCUTANEOUS | 11 refills | Status: DC

## 2022-10-18 NOTE — Telephone Encounter (Signed)
That sounds good.  Rx sent to [facility].

## 2022-10-18 NOTE — Telephone Encounter (Signed)
Patient has been on Trulicity 4.5 mg weekly and initially lost about 30 lbs (from 283# to 252#) but has been stagnant at 252-253 lbs for the past ~3 months. He is interested in further weight loss with alternative agents.  He currently gets Trulicity through manufacturer assistance and has 4 week supply remaining. I recommend to change to Ozempic 2 mg and start PAP process now (it usually takes about 4 weeks for approval/shipment of product).  Routing to PCP for input.

## 2022-10-19 ENCOUNTER — Telehealth: Payer: Self-pay

## 2022-10-19 NOTE — Patient Instructions (Signed)
Visit Information  Phone number for Pharmacist: [phone]  Thank you for meeting with me to discuss your medications! Below is a summary of what we talked about during the visit:   Recommendations/Changes made from today's visit: -Switch Trulicity 4.5 mg to Ozempic 2 mg - pursue PAP through [employer] (~1 month turnaround time) -Recommend lipid panel at next OV -Refer to LCSW for counseling  Follow up plan: -Pharmacist follow up televisit scheduled for 6 weeks -PCP appt 11/17/22   Charlene Brooke, PharmD, BCACP Clinical Pharmacist [facility] [phone]

## 2022-10-19 NOTE — Progress Notes (Signed)
Care Management & Coordination Services Pharmacy Team  Reason for Encounter: Patient Assistance forms  Patient has [employer]. he would like to pursue patient assistance for help with cost.  Reviewed application process for [place] patient assistance program. Patient meets income/out of pocket spend criteria for the program.   Patient assistance application forms have been downloaded on behalf of the patient. Forms have been sent to clinical pharmacist for review to determine next steps.  Charlene Brooke, PharmD notified  Marijean Niemann, [place] Clinical Pharmacy Assistant [phone]

## 2022-10-19 NOTE — Telephone Encounter (Cosign Needed)
Ozempic PAP has been completed electronically.  Charlene Brooke, PharmD notified  Marijean Niemann, [place] Clinical Pharmacy Assistant [phone]

## 2022-10-19 NOTE — Telephone Encounter (Signed)
Completed online application for Ozempic 2 mg. Will await response from [employer].

## 2022-10-20 ENCOUNTER — Telehealth: Payer: Self-pay | Admitting: *Deleted

## 2022-10-20 NOTE — Progress Notes (Signed)
  Care Coordination   Note   10/20/2022 Name: [name] MRN: [id number] DOB: 05/01/1951  [name] is a 72 y.o. year old male who sees Jinny Sanders, MD for primary care. I reached out to [name] by phone today to offer care coordination services.  [name] was given information about Care Coordination services today including:   The Care Coordination services include support from the care team which includes your Nurse Coordinator, Clinical Social Worker, or Pharmacist.  The Care Coordination team is here to help remove barriers to the health concerns and goals most important to you. Care Coordination services are voluntary, and the patient may decline or stop services at any time by request to their care team member.   Care Coordination Consent Status: Patient agreed to services and verbal consent obtained.   Follow up plan:  Telephone appointment with care coordination team member scheduled for:  10/21/2022  Encounter Outcome:  Pt. Scheduled  Julian Hy, [place] Direct Dial: [phone]

## 2022-10-21 ENCOUNTER — Ambulatory Visit: Payer: Self-pay | Admitting: *Deleted
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
## 2022-10-24 ENCOUNTER — Ambulatory Visit: Payer: Self-pay | Admitting: *Deleted

## 2022-10-24 NOTE — Patient Outreach (Addendum)
  Care Coordination   Follow Up Visit Note   10/24/2022 Name: TRISTEN ARRONA MRN: [id number] DOB: 04/14/1951  [name] is a 72 y.o. year old male who sees Jinny Sanders, MD for primary care. I spoke with  [name] by phone today.  What matters to the patients health and wellness today?  Grief management    Goals Addressed             This Visit's Progress    Grief Managment       Care Coordination Interventions: Patient willing consider increasing social activities to manage grief Verbalization of feelings encouraged-grief counseling offered but declined  Patient admits to thoughts of self harm at times with no plan.patient agreeable to contacting [phone] en the event of a mental health crisis Active listening / Reflection utilized  Emotional Support Provided Positive reinforcement provided for positive coping strategies          SDOH assessments and interventions completed:  Yes  SDOH Interventions Today    Flowsheet Row Most Recent Value  SDOH Interventions   Food Insecurity Interventions Intervention Not Indicated  Housing Interventions Intervention Not Indicated        Care Coordination Interventions:  Yes, provided  Interventions Today    Flowsheet Row Most Recent Value  Chronic Disease   Chronic disease during today's visit Diabetes, Hypertension (HTN)  Mental Health Interventions   Mental Health Discussed/Reviewed Mental Health Discussed, Grief and Loss, Coping Strategies       Follow up plan: Follow up call scheduled for 11/20/22 11am    Encounter Outcome:  Pt. Visit Completed

## 2022-10-24 NOTE — Patient Instructions (Addendum)
Visit Information  Thank you for taking time to visit with me today. Please don't hesitate to contact me if I can be of assistance to you.   Following are the goals we discussed today:   Goals Addressed             This Visit's Progress    Grief Managment       Care Coordination Interventions: Patient willing consider increasing social activities to manage grief Verbalization of feelings encouraged-grief counseling offered but declined  Patient admits to thoughts of self harm at times with no plan.patient agreeable to contacting [phone] en the event of a mental health crisis Active listening / Reflection utilized  Emotional Support Provided Positive reinforcement provided for positive coping strategies          Our next appointment is by telephone on 11/07/22 at 11am  Please call the care guide team at [phone] if you need to cancel or reschedule your appointment.   If you are experiencing a Mental Health or [place] or need someone to talk to, please call the Suicide and Crisis Lifeline: 988   Patient verbalizes understanding of instructions and care plan provided today and agrees to view in [place]. Active MyChart status and patient understanding of how to access instructions and care plan via MyChart confirmed with patient.     Telephone follow up appointment with care management team member scheduled for: 11/07/22   Elliot Gurney, [place] Worker  [facility] Care Management [phone]

## 2022-11-01 ENCOUNTER — Telehealth: Payer: Self-pay | Admitting: *Deleted

## 2022-11-01 NOTE — Telephone Encounter (Signed)
Pt came in office to pick up Rx Ozempic

## 2022-11-01 NOTE — Telephone Encounter (Signed)
Received from [employer] PAP:  Ozempic 8 mg/3 ml Lot # [id number] Exp: 12/26/2024 x 4 pens.  [name] notified by telephone that medication is here at the office ready to be picked up.

## 2022-11-03 ENCOUNTER — Other Ambulatory Visit: Payer: Self-pay | Admitting: Family Medicine

## 2022-11-03 DIAGNOSIS — E11319 Type 2 diabetes mellitus with unspecified diabetic retinopathy without macular edema: Secondary | ICD-10-CM

## 2022-11-04 ENCOUNTER — Telehealth: Payer: Self-pay | Admitting: Family Medicine

## 2022-11-04 NOTE — Telephone Encounter (Signed)
Noted.  Hope he and dog continue feel better!

## 2022-11-04 NOTE — Telephone Encounter (Signed)
See access nurse note.  Called and spoke to patient and was advised that he called 911 and EMS came out. Patient stated that he thew up again and now he feels just fine. Patient stated that he is not  having any SOB now. Patient stated that he is sure that the chicken and dumplings that he ate last night were bad. Patient stated that his dog got sick too. Patient stated when EMS got there his blood pressure was 191/90. Patient stated that EMS took it later and told him it was normal but did not give him the readings. Patient stated that he has no way of checking his blood pressure at home. Patient stated that he feels fine now and does not feel that he needs to be seen. Patient was given ER precautions and he verbalized understanding.

## 2022-11-04 NOTE — Telephone Encounter (Signed)
PLEASE NOTE: All timestamps contained within this report are represented as [place] Standard Time. CONFIDENTIALTY NOTICE: This fax transmission is intended only for the addressee. It contains information that is legally privileged, confidential or otherwise protected from use or disclosure. If you are not the intended recipient, you are strictly prohibited from reviewing, disclosing, copying using or disseminating any of this information or taking any action in reliance on or regarding this information. If you have received this fax in error, please notify us immediately by telephone so that we can arrange for its return to [place]. Phone: [phone], Toll-Free: [phone], Fax: [phone] Page: 1 of 2 Call Id: [id number] [facility] - Client TELEPHONE ADVICE RECORD [facility] Patient Name: [name] Gender: Male DOB: 07-10-1951 Age: 72 Y 11 M 25 D Return Phone Number: [phone] (Secondary), [phone] (Alternate) Address: City/ State/ Zip: [place]  [address] Client [facility] - Client Client Site [place] - Day Provider [name] - MD Contact Type Call Who Is Calling Patient / Member / Family / Caregiver Call Type Triage / Clinical Caller Name Christy Relationship To Patient Daughter Return Phone Number [phone] (Alternate) Chief Complaint Vomiting Reason for Call Symptomatic / Request for [place] states father has been vomiting and has body aches. States he had a shot yesterday. Translation No Nurse Assessment Nurse: Raenette Rover, RN, Zella Ball Date/Time (Eastern Time): 11/04/2022 10:44:33 AM Confirm and document reason for call. If symptomatic, describe symptoms. ---Caller states father has been vomiting and has body aches. States he had a shot yesterday in the office in the place of [place]. Vomiting started after eating some chicken and  dumplings Vomited 3 times. Does the patient have any new or worsening symptoms? ---Yes Will a triage be completed? ---Yes Related visit to physician within the last 2 weeks? ---Yes Does the PT have any chronic conditions? (i.e. diabetes, asthma, this includes High risk factors for pregnancy, etc.) ---Yes List chronic conditions. ---diabetes, htn and heart trouble. Is this a behavioral health or substance abuse call? ---No Guidelines Guideline Title Affirmed Question Affirmed Notes Nurse Date/Time (Eastern Time) Vomiting [1] MODERATE vomiting (e.g., 3 - 5 times/day) AND [2] age > 28 years Lahoma Crocker 11/04/2022 10:48:43 AM Disp. Time Eilene Ghazi Time) Disposition Final User 11/04/2022 10:55:54 AM Go to ED Now (or PCP triage) Yes Raenette Rover, RN, Zella Ball PLEASE NOTE: All timestamps contained within this report are represented as [place] Standard Time. CONFIDENTIALTY NOTICE: This fax transmission is intended only for the addressee. It contains information that is legally privileged, confidential or otherwise protected from use or disclosure. If you are not the intended recipient, you are strictly prohibited from reviewing, disclosing, copying using or disseminating any of this information or taking any action in reliance on or regarding this information. If you have received this fax in error, please notify us immediately by telephone so that we can arrange for its return to [place]. Phone: [phone], Toll-Free: [phone], Fax: [phone] Page: 2 of 2 Call Id: [id number] Final Disposition 11/04/2022 10:55:54 AM Go to ED Now (or PCP triage) Willaim Sheng, RN, Herbert Deaner Disagree/Comply Comply Caller Understands Yes PreDisposition Call Doctor Care Advice Given Per Guideline GO TO ED NOW (OR PCP TRIAGE): CARE ADVICE per Vomiting (Adult) guideline. Comments User: Wilson Singer, RN Date/Time Eilene Ghazi Time): 11/04/2022 10:49:07 AM BS was 101 this morning and then after vomiting went to  175 User: Wilson Singer, RN Date/Time Eilene Ghazi Time): 11/04/2022 10:50:14 AM  took off trullicity and started on something else not sure what the name of it was. User: Wilson Singer, RN Date/Time Eilene Ghazi Time): 11/04/2022 10:57:09 AM Up graded to go to the ER as he sounds sob over the phone and is diabetic and has been vomiting since yesterday.

## 2022-11-04 NOTE — Telephone Encounter (Signed)
Patient daughter Alyse Low called in stating that [name] has been vomiting and experiencing body aches. She stated she isn't sure if its a reaction to a shot he had got yesterday. Sent over to access nurse.

## 2022-11-05 ENCOUNTER — Inpatient Hospital Stay (HOSPITAL_COMMUNITY)
Admission: EM | Admit: 2022-11-05 | Discharge: 2022-11-08 | DRG: 871 | Disposition: A | Discharge status: Home / Self Care | Payer: HMO | Attending: Internal Medicine | Admitting: Internal Medicine

## 2022-11-05 ENCOUNTER — Other Ambulatory Visit: Payer: Self-pay

## 2022-11-05 ENCOUNTER — Encounter (HOSPITAL_COMMUNITY): Payer: Self-pay | Admitting: Emergency Medicine

## 2022-11-05 ENCOUNTER — Emergency Department (HOSPITAL_COMMUNITY): Payer: HMO

## 2022-11-05 DIAGNOSIS — I110 Hypertensive heart disease with heart failure: Secondary | ICD-10-CM | POA: Diagnosis not present

## 2022-11-05 DIAGNOSIS — A4189 Other specified sepsis: Secondary | ICD-10-CM | POA: Diagnosis not present

## 2022-11-05 DIAGNOSIS — J9601 Acute respiratory failure with hypoxia: Secondary | ICD-10-CM | POA: Diagnosis not present

## 2022-11-05 DIAGNOSIS — E785 Hyperlipidemia, unspecified: Secondary | ICD-10-CM | POA: Diagnosis not present

## 2022-11-05 DIAGNOSIS — Z820 Family history of epilepsy and other diseases of the nervous system: Secondary | ICD-10-CM

## 2022-11-05 DIAGNOSIS — E11319 Type 2 diabetes mellitus with unspecified diabetic retinopathy without macular edema: Secondary | ICD-10-CM | POA: Diagnosis present

## 2022-11-05 DIAGNOSIS — R0902 Hypoxemia: Secondary | ICD-10-CM

## 2022-11-05 DIAGNOSIS — Z7985 Long-term (current) use of injectable non-insulin antidiabetic drugs: Secondary | ICD-10-CM

## 2022-11-05 DIAGNOSIS — M545 Low back pain, unspecified: Secondary | ICD-10-CM | POA: Diagnosis not present

## 2022-11-05 DIAGNOSIS — E1165 Type 2 diabetes mellitus with hyperglycemia: Secondary | ICD-10-CM | POA: Diagnosis present

## 2022-11-05 DIAGNOSIS — I872 Venous insufficiency (chronic) (peripheral): Secondary | ICD-10-CM | POA: Diagnosis present

## 2022-11-05 DIAGNOSIS — Z79899 Other long term (current) drug therapy: Secondary | ICD-10-CM | POA: Diagnosis not present

## 2022-11-05 DIAGNOSIS — A419 Sepsis, unspecified organism: Secondary | ICD-10-CM

## 2022-11-05 DIAGNOSIS — Z7982 Long term (current) use of aspirin: Secondary | ICD-10-CM

## 2022-11-05 DIAGNOSIS — Z794 Long term (current) use of insulin: Secondary | ICD-10-CM

## 2022-11-05 DIAGNOSIS — Z8249 Family history of ischemic heart disease and other diseases of the circulatory system: Secondary | ICD-10-CM | POA: Diagnosis not present

## 2022-11-05 DIAGNOSIS — Z825 Family history of asthma and other chronic lower respiratory diseases: Secondary | ICD-10-CM

## 2022-11-05 DIAGNOSIS — R739 Hyperglycemia, unspecified: Secondary | ICD-10-CM | POA: Diagnosis not present

## 2022-11-05 DIAGNOSIS — R778 Other specified abnormalities of plasma proteins: Secondary | ICD-10-CM | POA: Diagnosis not present

## 2022-11-05 DIAGNOSIS — R6520 Severe sepsis without septic shock: Secondary | ICD-10-CM

## 2022-11-05 DIAGNOSIS — F4323 Adjustment disorder with mixed anxiety and depressed mood: Secondary | ICD-10-CM | POA: Diagnosis present

## 2022-11-05 DIAGNOSIS — R079 Chest pain, unspecified: Secondary | ICD-10-CM | POA: Diagnosis not present

## 2022-11-05 DIAGNOSIS — Z833 Family history of diabetes mellitus: Secondary | ICD-10-CM

## 2022-11-05 DIAGNOSIS — I5021 Acute systolic (congestive) heart failure: Secondary | ICD-10-CM | POA: Diagnosis not present

## 2022-11-05 DIAGNOSIS — Z808 Family history of malignant neoplasm of other organs or systems: Secondary | ICD-10-CM | POA: Diagnosis not present

## 2022-11-05 DIAGNOSIS — U071 COVID-19: Secondary | ICD-10-CM

## 2022-11-05 DIAGNOSIS — I214 Non-ST elevation (NSTEMI) myocardial infarction: Secondary | ICD-10-CM | POA: Diagnosis present

## 2022-11-05 DIAGNOSIS — E1169 Type 2 diabetes mellitus with other specified complication: Secondary | ICD-10-CM | POA: Diagnosis not present

## 2022-11-05 DIAGNOSIS — Z951 Presence of aortocoronary bypass graft: Secondary | ICD-10-CM | POA: Diagnosis not present

## 2022-11-05 DIAGNOSIS — Z7902 Long term (current) use of antithrombotics/antiplatelets: Secondary | ICD-10-CM | POA: Diagnosis not present

## 2022-11-05 DIAGNOSIS — G8929 Other chronic pain: Secondary | ICD-10-CM

## 2022-11-05 DIAGNOSIS — E538 Deficiency of other specified B group vitamins: Secondary | ICD-10-CM | POA: Diagnosis present

## 2022-11-05 DIAGNOSIS — Z6841 Body Mass Index (BMI) 40.0 and over, adult: Secondary | ICD-10-CM | POA: Diagnosis not present

## 2022-11-05 DIAGNOSIS — E113393 Type 2 diabetes mellitus with moderate nonproliferative diabetic retinopathy without macular edema, bilateral: Secondary | ICD-10-CM

## 2022-11-05 DIAGNOSIS — R0602 Shortness of breath: Secondary | ICD-10-CM | POA: Diagnosis not present

## 2022-11-05 DIAGNOSIS — I5033 Acute on chronic diastolic (congestive) heart failure: Secondary | ICD-10-CM

## 2022-11-05 DIAGNOSIS — R7989 Other specified abnormal findings of blood chemistry: Secondary | ICD-10-CM

## 2022-11-05 DIAGNOSIS — I21A1 Myocardial infarction type 2: Secondary | ICD-10-CM | POA: Diagnosis present

## 2022-11-05 DIAGNOSIS — I700 Atherosclerosis of aorta: Secondary | ICD-10-CM | POA: Diagnosis not present

## 2022-11-05 DIAGNOSIS — R000 Tachycardia, unspecified: Secondary | ICD-10-CM | POA: Diagnosis not present

## 2022-11-05 DIAGNOSIS — I2510 Atherosclerotic heart disease of native coronary artery without angina pectoris: Secondary | ICD-10-CM | POA: Diagnosis present

## 2022-11-05 DIAGNOSIS — I5041 Acute combined systolic (congestive) and diastolic (congestive) heart failure: Secondary | ICD-10-CM | POA: Diagnosis not present

## 2022-11-05 DIAGNOSIS — I25118 Atherosclerotic heart disease of native coronary artery with other forms of angina pectoris: Secondary | ICD-10-CM | POA: Diagnosis present

## 2022-11-05 DIAGNOSIS — I252 Old myocardial infarction: Secondary | ICD-10-CM

## 2022-11-05 DIAGNOSIS — J90 Pleural effusion, not elsewhere classified: Secondary | ICD-10-CM | POA: Diagnosis not present

## 2022-11-05 DIAGNOSIS — J9600 Acute respiratory failure, unspecified whether with hypoxia or hypercapnia: Secondary | ICD-10-CM

## 2022-11-05 DIAGNOSIS — R112 Nausea with vomiting, unspecified: Secondary | ICD-10-CM | POA: Diagnosis not present

## 2022-11-05 LAB — LIPASE, BLOOD: Lipase: 32 U/L (ref 11–51)

## 2022-11-05 LAB — COMPREHENSIVE METABOLIC PANEL
ALT: 16 U/L (ref 0–44)
AST: 31 U/L (ref 15–41)
Albumin: 3.5 g/dL (ref 3.5–5.0)
Alkaline Phosphatase: 73 U/L (ref 38–126)
Anion gap: 14 (ref 5–15)
BUN: 20 mg/dL (ref 8–23)
CO2: 20 mmol/L — ABNORMAL LOW (ref 22–32)
Calcium: 8.5 mg/dL — ABNORMAL LOW (ref 8.9–10.3)
Chloride: 103 mmol/L (ref 98–111)
Creatinine, Ser: 1.12 mg/dL (ref 0.61–1.24)
GFR, Estimated: >60 mL/min (ref >60)
Glucose, Bld: 299 mg/dL — ABNORMAL HIGH (ref 70–99)
Potassium: 3.8 mmol/L (ref 3.5–5.1)
Sodium: 137 mmol/L (ref 135–145)
Total Bilirubin: 0.8 mg/dL (ref 0.3–1.2)
Total Protein: 7.2 g/dL (ref 6.5–8.1)

## 2022-11-05 LAB — CBC WITH DIFFERENTIAL/PLATELET
Abs Immature Granulocytes: 0.02 10*3/uL (ref 0.00–0.07)
Basophils Absolute: 0 10*3/uL (ref 0.0–0.1)
Basophils Relative: 0 %
Eosinophils Absolute: 0 10*3/uL (ref 0.0–0.5)
Eosinophils Relative: 0 %
HCT: 35.6 % — ABNORMAL LOW (ref 39.0–52.0)
Hemoglobin: 11.1 g/dL — ABNORMAL LOW (ref 13.0–17.0)
Immature Granulocytes: 0 %
Lymphocytes Relative: 4 %
Lymphs Abs: 0.3 10*3/uL — ABNORMAL LOW (ref 0.7–4.0)
MCH: 26.1 pg (ref 26.0–34.0)
MCHC: 31.2 g/dL (ref 30.0–36.0)
MCV: 83.6 fL (ref 80.0–100.0)
Monocytes Absolute: 0.3 10*3/uL (ref 0.1–1.0)
Monocytes Relative: 5 %
Neutro Abs: 6.2 10*3/uL (ref 1.7–7.7)
Neutrophils Relative %: 91 %
Platelets: 202 10*3/uL (ref 150–400)
RBC: 4.26 MIL/uL (ref 4.22–5.81)
RDW: 17.8 % — ABNORMAL HIGH (ref 11.5–15.5)
WBC: 6.8 10*3/uL (ref 4.0–10.5)
nRBC: 0 % (ref 0.0–0.2)

## 2022-11-05 LAB — RESP PANEL BY RT-PCR (RSV, FLU A&B, COVID)  RVPGX2
Influenza A by PCR: NEGATIVE
Influenza B by PCR: NEGATIVE
Resp Syncytial Virus by PCR: NEGATIVE
SARS Coronavirus 2 by RT PCR: POSITIVE — AB

## 2022-11-05 LAB — URINALYSIS, ROUTINE W REFLEX MICROSCOPIC
Bacteria, UA: NONE SEEN
Bilirubin Urine: NEGATIVE
Glucose, UA: >=500 mg/dL — AB
Hgb urine dipstick: NEGATIVE
Ketones, ur: NEGATIVE mg/dL
Leukocytes,Ua: NEGATIVE
Nitrite: NEGATIVE
Protein, ur: NEGATIVE mg/dL
Specific Gravity, Urine: 1.023 (ref 1.005–1.030)
pH: 5 (ref 5.0–8.0)

## 2022-11-05 LAB — D-DIMER, QUANTITATIVE: D-Dimer, Quant: 1.56 ug/mL-FEU — ABNORMAL HIGH (ref 0.00–0.50)

## 2022-11-05 LAB — BRAIN NATRIURETIC PEPTIDE: B Natriuretic Peptide: 1240.5 pg/mL — ABNORMAL HIGH (ref 0.0–100.0)

## 2022-11-05 LAB — LACTIC ACID, PLASMA
Lactic Acid, Venous: 2.5 mmol/L (ref 0.5–1.9)
Lactic Acid, Venous: 1.9 mmol/L (ref 0.5–1.9)

## 2022-11-05 LAB — TROPONIN I (HIGH SENSITIVITY)
Troponin I (High Sensitivity): 1268 ng/L (ref <18)
Troponin I (High Sensitivity): 1880 ng/L (ref <18)

## 2022-11-05 LAB — GLUCOSE, CAPILLARY
Glucose-Capillary: 211 mg/dL — ABNORMAL HIGH (ref 70–99)
Glucose-Capillary: 294 mg/dL — ABNORMAL HIGH (ref 70–99)

## 2022-11-05 LAB — LACTATE DEHYDROGENASE: LDH: 227 U/L — ABNORMAL HIGH (ref 98–192)

## 2022-11-05 LAB — PROTIME-INR
INR: 1.2 (ref 0.8–1.2)
Prothrombin Time: 14.9 seconds (ref 11.4–15.2)

## 2022-11-05 LAB — SEDIMENTATION RATE: Sed Rate: 37 mm/hr — ABNORMAL HIGH (ref 0–16)

## 2022-11-05 LAB — PROCALCITONIN: Procalcitonin: <0.1 ng/mL

## 2022-11-05 LAB — MAGNESIUM: Magnesium: 1.9 mg/dL (ref 1.7–2.4)

## 2022-11-05 LAB — C-REACTIVE PROTEIN: CRP: 6.6 mg/dL — ABNORMAL HIGH (ref <1.0)

## 2022-11-05 LAB — HEPARIN LEVEL (UNFRACTIONATED): Heparin Unfractionated: 0.25 IU/mL — ABNORMAL LOW (ref 0.30–0.70)

## 2022-11-05 LAB — TSH: TSH: 2.218 u[IU]/mL (ref 0.350–4.500)

## 2022-11-05 LAB — APTT: aPTT: 32 seconds (ref 24–36)

## 2022-11-05 MED ORDER — CARVEDILOL 3.125 MG PO TABS
6.2500 mg | ORAL_TABLET | Freq: Every day | ORAL | Status: DC
  Administered 2022-11-05 – 2022-11-06 (×2): 6.25 mg via ORAL
  Filled 2022-11-05 (×2): qty 2

## 2022-11-05 MED ORDER — ASPIRIN 81 MG PO TBEC
81.0000 mg | DELAYED_RELEASE_TABLET | Freq: Every day | ORAL | Status: DC
  Administered 2022-11-05 – 2022-11-08 (×4): 81 mg via ORAL
  Filled 2022-11-05 (×4): qty 1

## 2022-11-05 MED ORDER — ONDANSETRON HCL 4 MG/2ML IJ SOLN: 4.0000 mg | Freq: Four times a day (QID) | INTRAMUSCULAR | Status: DC | PRN

## 2022-11-05 MED ORDER — IPRATROPIUM-ALBUTEROL 20-100 MCG/ACT IN AERS: 1.0000 | INHALATION_SPRAY | Freq: Four times a day (QID) | RESPIRATORY_TRACT | Status: DC | PRN

## 2022-11-05 MED ORDER — INSULIN DEGLUDEC 100 UNIT/ML ~~LOC~~ SOPN: 40.0000 [IU] | PEN_INJECTOR | Freq: Every day | SUBCUTANEOUS | Status: DC

## 2022-11-05 MED ORDER — VENLAFAXINE HCL ER 75 MG PO CP24
150.0000 mg | ORAL_CAPSULE | Freq: Every day | ORAL | Status: DC
  Administered 2022-11-06 – 2022-11-08 (×3): 150 mg via ORAL
  Filled 2022-11-05 (×3): qty 2

## 2022-11-05 MED ORDER — METRONIDAZOLE 500 MG/100ML IV SOLN
500.0000 mg | Freq: Once | INTRAVENOUS | Status: AC
  Administered 2022-11-05: 500 mg via INTRAVENOUS
  Filled 2022-11-05: qty 100

## 2022-11-05 MED ORDER — MELATONIN 3 MG PO TABS
3.0000 mg | ORAL_TABLET | Freq: Every evening | ORAL | Status: DC | PRN
  Administered 2022-11-05 – 2022-11-06 (×2): 3 mg via ORAL
  Filled 2022-11-05 (×2): qty 1

## 2022-11-05 MED ORDER — PANTOPRAZOLE SODIUM 40 MG IV SOLR
40.0000 mg | INTRAVENOUS | Status: DC
  Administered 2022-11-05 – 2022-11-07 (×3): 40 mg via INTRAVENOUS
  Filled 2022-11-05 (×3): qty 10

## 2022-11-05 MED ORDER — HYDROXYZINE HCL 10 MG PO TABS
10.0000 mg | ORAL_TABLET | Freq: Three times a day (TID) | ORAL | Status: DC | PRN
  Administered 2022-11-05 – 2022-11-07 (×3): 10 mg via ORAL
  Filled 2022-11-05 (×3): qty 1

## 2022-11-05 MED ORDER — SODIUM CHLORIDE 0.9 % IV SOLN
100.0000 mg | Freq: Every day | INTRAVENOUS | Status: AC
  Administered 2022-11-06 – 2022-11-07 (×2): 100 mg via INTRAVENOUS
  Filled 2022-11-05 (×2): qty 20

## 2022-11-05 MED ORDER — NITROGLYCERIN 0.4 MG SL SUBL: 0.4000 mg | SUBLINGUAL_TABLET | SUBLINGUAL | Status: DC | PRN

## 2022-11-05 MED ORDER — SODIUM CHLORIDE 0.9 % IV SOLN
2.0000 g | Freq: Once | INTRAVENOUS | Status: AC
  Administered 2022-11-05: 2 g via INTRAVENOUS
  Filled 2022-11-05: qty 12.5

## 2022-11-05 MED ORDER — HEPARIN BOLUS VIA INFUSION
4000.0000 [IU] | Freq: Once | INTRAVENOUS | Status: AC
  Administered 2022-11-05: 4000 [IU] via INTRAVENOUS
  Filled 2022-11-05: qty 4000

## 2022-11-05 MED ORDER — HYDROCODONE-ACETAMINOPHEN 5-325 MG PO TABS
1.0000 | ORAL_TABLET | Freq: Two times a day (BID) | ORAL | Status: DC | PRN
  Administered 2022-11-05 – 2022-11-07 (×3): 1 via ORAL
  Filled 2022-11-05 (×4): qty 1

## 2022-11-05 MED ORDER — LOSARTAN POTASSIUM 50 MG PO TABS
50.0000 mg | ORAL_TABLET | Freq: Every day | ORAL | Status: DC
  Administered 2022-11-05 – 2022-11-08 (×3): 50 mg via ORAL
  Filled 2022-11-05 (×4): qty 1

## 2022-11-05 MED ORDER — SODIUM CHLORIDE 0.9 % IV SOLN
200.0000 mg | Freq: Once | INTRAVENOUS | Status: AC
  Administered 2022-11-05: 200 mg via INTRAVENOUS
  Filled 2022-11-05: qty 40

## 2022-11-05 MED ORDER — LACTATED RINGERS IV SOLN: INTRAVENOUS | Status: DC

## 2022-11-05 MED ORDER — HEPARIN BOLUS VIA INFUSION
1300.0000 [IU] | Freq: Once | INTRAVENOUS | Status: AC
  Administered 2022-11-05: 1300 [IU] via INTRAVENOUS
  Filled 2022-11-05: qty 1300

## 2022-11-05 MED ORDER — VANCOMYCIN HCL 750 MG/150ML IV SOLN: 750.0000 mg | Freq: Two times a day (BID) | INTRAVENOUS | Status: DC

## 2022-11-05 MED ORDER — EZETIMIBE 10 MG PO TABS
10.0000 mg | ORAL_TABLET | Freq: Every day | ORAL | Status: DC
  Administered 2022-11-05 – 2022-11-08 (×4): 10 mg via ORAL
  Filled 2022-11-05 (×4): qty 1

## 2022-11-05 MED ORDER — IOHEXOL 350 MG/ML SOLN
75.0000 mL | Freq: Once | INTRAVENOUS | Status: AC | PRN
  Administered 2022-11-05: 75 mL via INTRAVENOUS

## 2022-11-05 MED ORDER — ISOSORBIDE MONONITRATE ER 30 MG PO TB24
30.0000 mg | ORAL_TABLET | Freq: Two times a day (BID) | ORAL | Status: DC
  Administered 2022-11-05 – 2022-11-08 (×6): 30 mg via ORAL
  Filled 2022-11-05 (×6): qty 1

## 2022-11-05 MED ORDER — VANCOMYCIN HCL IN DEXTROSE 1-5 GM/200ML-% IV SOLN: 1000.0000 mg | Freq: Once | INTRAVENOUS | Status: DC

## 2022-11-05 MED ORDER — SODIUM CHLORIDE 0.9% FLUSH
3.0000 mL | Freq: Two times a day (BID) | INTRAVENOUS | Status: DC
  Administered 2022-11-05 – 2022-11-08 (×6): 3 mL via INTRAVENOUS

## 2022-11-05 MED ORDER — INSULIN ASPART 100 UNIT/ML IJ SOLN
0.0000 [IU] | Freq: Three times a day (TID) | INTRAMUSCULAR | Status: DC
  Administered 2022-11-05: 5 [IU] via SUBCUTANEOUS
  Administered 2022-11-06: 15 [IU] via SUBCUTANEOUS
  Administered 2022-11-06: 8 [IU] via SUBCUTANEOUS
  Administered 2022-11-06 – 2022-11-07 (×2): 5 [IU] via SUBCUTANEOUS
  Administered 2022-11-07 – 2022-11-08 (×2): 3 [IU] via SUBCUTANEOUS

## 2022-11-05 MED ORDER — VENLAFAXINE HCL ER 75 MG PO CP24
75.0000 mg | ORAL_CAPSULE | Freq: Every day | ORAL | Status: DC
  Administered 2022-11-06 – 2022-11-08 (×3): 75 mg via ORAL
  Filled 2022-11-05 (×3): qty 1

## 2022-11-05 MED ORDER — FUROSEMIDE 10 MG/ML IJ SOLN
40.0000 mg | Freq: Every day | INTRAMUSCULAR | Status: DC
  Administered 2022-11-06: 40 mg via INTRAVENOUS
  Filled 2022-11-05: qty 4

## 2022-11-05 MED ORDER — FUROSEMIDE 10 MG/ML IJ SOLN
40.0000 mg | Freq: Once | INTRAMUSCULAR | Status: AC
  Administered 2022-11-05: 40 mg via INTRAVENOUS
  Filled 2022-11-05: qty 4

## 2022-11-05 MED ORDER — SODIUM CHLORIDE 0.9 % IV SOLN
2.0000 g | Freq: Three times a day (TID) | INTRAVENOUS | Status: DC
  Filled 2022-11-05: qty 12.5

## 2022-11-05 MED ORDER — DEXAMETHASONE SODIUM PHOSPHATE 10 MG/ML IJ SOLN
10.0000 mg | Freq: Once | INTRAMUSCULAR | Status: AC
  Administered 2022-11-05: 10 mg via INTRAVENOUS
  Filled 2022-11-05: qty 1

## 2022-11-05 MED ORDER — INSULIN DEGLUDEC 100 UNIT/ML ~~LOC~~ SOPN: 35.0000 [IU] | PEN_INJECTOR | Freq: Every day | SUBCUTANEOUS | Status: DC

## 2022-11-05 MED ORDER — CLOPIDOGREL BISULFATE 75 MG PO TABS
75.0000 mg | ORAL_TABLET | Freq: Every day | ORAL | Status: DC
  Administered 2022-11-06 – 2022-11-08 (×3): 75 mg via ORAL
  Filled 2022-11-05 (×3): qty 1

## 2022-11-05 MED ORDER — LOSARTAN POTASSIUM 50 MG PO TABS: 50.0000 mg | ORAL_TABLET | Freq: Every day | ORAL | Status: DC

## 2022-11-05 MED ORDER — VANCOMYCIN HCL 2000 MG/400ML IV SOLN
2000.0000 mg | Freq: Once | INTRAVENOUS | Status: AC
  Administered 2022-11-05: 2000 mg via INTRAVENOUS
  Filled 2022-11-05 (×2): qty 400

## 2022-11-05 MED ORDER — IPRATROPIUM-ALBUTEROL 0.5-2.5 (3) MG/3ML IN SOLN: 3.0000 mL | Freq: Four times a day (QID) | RESPIRATORY_TRACT | Status: DC | PRN

## 2022-11-05 MED ORDER — VITAMIN D 25 MCG (1000 UNIT) PO TABS
2000.0000 [IU] | ORAL_TABLET | Freq: Every day | ORAL | Status: DC
  Administered 2022-11-06 – 2022-11-08 (×3): 2000 [IU] via ORAL
  Filled 2022-11-05 (×3): qty 2

## 2022-11-05 MED ORDER — HEPARIN (PORCINE) 25000 UT/250ML-% IV SOLN
1200.0000 [IU]/h | INTRAVENOUS | Status: DC
  Administered 2022-11-05: 1050 [IU]/h via INTRAVENOUS
  Filled 2022-11-05 (×3): qty 250

## 2022-11-05 MED ORDER — VITAMIN B-12 1000 MCG PO TABS
1000.0000 ug | ORAL_TABLET | Freq: Every day | ORAL | Status: DC
  Administered 2022-11-06 – 2022-11-08 (×3): 1000 ug via ORAL
  Filled 2022-11-05 (×3): qty 1

## 2022-11-05 MED ORDER — INSULIN GLARGINE-YFGN 100 UNIT/ML ~~LOC~~ SOLN
35.0000 [IU] | Freq: Every day | SUBCUTANEOUS | Status: DC
  Administered 2022-11-05 – 2022-11-07 (×3): 35 [IU] via SUBCUTANEOUS
  Filled 2022-11-05 (×4): qty 0.35

## 2022-11-05 MED ORDER — SODIUM CHLORIDE 0.9% FLUSH: 3.0000 mL | INTRAVENOUS | Status: DC | PRN

## 2022-11-05 MED ORDER — ACETAMINOPHEN 325 MG PO TABS: 650.0000 mg | ORAL_TABLET | ORAL | Status: DC | PRN

## 2022-11-05 MED ORDER — SPIRONOLACTONE 25 MG PO TABS
25.0000 mg | ORAL_TABLET | Freq: Every day | ORAL | Status: DC
  Administered 2022-11-05 – 2022-11-08 (×4): 25 mg via ORAL
  Filled 2022-11-05: qty 1
  Filled 2022-11-05: qty 2
  Filled 2022-11-05 (×2): qty 1

## 2022-11-05 MED ORDER — INSULIN ASPART 100 UNIT/ML IJ SOLN
4.0000 [IU] | Freq: Three times a day (TID) | INTRAMUSCULAR | Status: DC
  Administered 2022-11-05 – 2022-11-08 (×7): 4 [IU] via SUBCUTANEOUS

## 2022-11-05 MED ORDER — SODIUM CHLORIDE 0.9 % IV SOLN: 250.0000 mL | INTRAVENOUS | Status: DC | PRN

## 2022-11-05 MED ORDER — ACETAMINOPHEN 325 MG PO TABS
650.0000 mg | ORAL_TABLET | Freq: Once | ORAL | Status: AC
  Administered 2022-11-05: 650 mg via ORAL
  Filled 2022-11-05: qty 2

## 2022-11-05 MED ORDER — DEXAMETHASONE SODIUM PHOSPHATE 10 MG/ML IJ SOLN
6.0000 mg | INTRAMUSCULAR | Status: DC
  Administered 2022-11-06 – 2022-11-07 (×3): 6 mg via INTRAVENOUS
  Filled 2022-11-05 (×3): qty 0.6

## 2022-11-05 NOTE — Sepsis Progress Note (Signed)
Sepsis protocol is being followed by [facility]. 

## 2022-11-05 NOTE — Progress Notes (Signed)
Pharmacy Antibiotic Note  [name] is a 72 y.o. male admitted on 11/05/2022 with sepsis.  Pharmacy has been consulted for cefepime and vancomycin dosing.  Plan: Cefepime 2g Q8H.  Give IV Vancomycin '2000mg'$  x 1 for loading dose, followed by IV Vancomycin 750 every 12 hours for eAUC535. Using Vd: 0.5, Scr: 1.12.  Follow culture data for de-escalation.  Monitor renal function for dose adjustments as indicated.    Height: '5\' 6"'$  (167.6 cm) Weight: 114.3 kg (252 lb) IBW/kg (Calculated) : 63.8  Temp (24hrs), Avg:101.2 F (38.4 C), Min:99.9 F (37.7 C), Max:102.4 F (39.1 C)  Recent Labs  Lab 11/05/22 0824 11/05/22 0839  WBC 6.8  --   CREATININE 1.12  --   LATICACIDVEN  --  2.5*    Estimated Creatinine Clearance: 70.8 mL/min (by C-G formula based on SCr of 1.12 mg/dL).    Allergies  Allergen Reactions   Bupropion Nausea Only   Ozempic (0.25 Or 0.5 Mg-Dose) [Semaglutide(0.25 Or 0.'5mg'$ -Dos)] Nausea And Vomiting   Atorvastatin Other (See Comments)    Body aches Similar effect with rosuvastatin 40 mg twice weekly    Thank you for allowing pharmacy to be a part of this patient's care.  Esmeralda Arthur, PharmD, BCCCP  11/05/2022 10:03 AM

## 2022-11-05 NOTE — H&P (Addendum)
History and Physical    DOA: 11/05/2022  PCP: Jinny Sanders, MD  Patient coming from: HOME  Chief Complaint: n/v and dyspnea  HPI: [name] is a 72 y.o. male with history h/o hypertension, hyperlipidemia, CAD/non-STEMI in 0000000, chronic diastolic CHF, morbid obesity, osteoarthritis, depression presents with complaints of nausea, vomiting and dyspnea over the last 3 days.  Patient states he was prescribed alternative insulin-Ozempic-by his primary care physician on Wednesday to help aid weight loss and better diabetes control.  Upon using the first dose, patient felt sick to his stomach and had about 5 episodes of nausea and vomiting.  He states he subsequently developed dyspnea which worsened over the last couple of days.  He vaguely reports midsternal chest pain, 4/10, dull aching, nonradiating which started after the vomiting episodes and he feels it was related to retching.  He reports compliance with his cardiac medications including diuretics and antiplatelet agents.  He does have leg swellings on exam but states that his his baseline and not very concerned.  Currently denies any chest pain. ED course: Patient febrile at 102.24F on arrival with tachycardia, tachypnea.  Blood pressure 132/74-164/88.  WBC 6.8, hemoglobin 11.1, hematocrit 35.6, platelets 202, sodium 137, potassium 3.8, chloride 103, bicarb 20, BUN 20, creatinine 1.12, calcium 8.5, troponin 1268->1880.  BNP elevated at 1240.  EKG showed sinus tachycardia with repolarization abnormality in lateral leads, QTc 409 ms.  Lactate elevated at 2.5 and respiratory panel positive for COVID-19.  Due to fever, tachycardia and elevated LA, patient initially started on sepsis protocol with broad-spectrum antibiotics and IV fluids.  D-dimer also was elevated.  CT PE protocol was negative for pulmonary embolism but showed CHF.  Given elevated BNP and troponin, cardiology consulted-per the recommendation patient initiated on heparin drip and  hospitalist admission requested.  Patient also received 1 dose of IV Lasix, IV Decadron in the ED.  He reports feeling better after these interventions in the ED.  Already evaluated by cardiology.  Daughter at bedside.   Review of Systems: As per HPI, otherwise review of systems negative.    Past Medical History:  Diagnosis Date   Back injury 02/2002   worker's comp   CHF (congestive heart failure) ([place])    Coronary artery disease, non-occlusive    a. cath 2002 with no sig CAD;  b. cath 2008 normal LM, LAD, LCx, p&dRCA 20-30%, PDA 30%; c.11/2015 NSTEMI/PCI: LM nl, LAD 95p (2.5x15 Xience DES), LCX nl, RCA 100p/m w/ L->R collats, EF 55-65% c. NSTEMI (02/2016) with no culprit leision, switched to Brilinta.  d. NSTEMI 03/2016: again, no culprit lesion and switched back to plavix 2/2 SOB with Brilnta.     Depression    Diabetes mellitus type 2, insulin dependent ([place])    Hyperlipemia    Hypertensive heart disease    Kidney stones    Morbid obesity ([place])    Osteoarthritis    Snoring     Past Surgical History:  Procedure Laterality Date   CARDIAC CATHETERIZATION  09/29/2000   diffuse LAD 30% LCA  EF 50-60%   CARDIAC CATHETERIZATION  06/30/2007   no significant CAD   CARDIAC CATHETERIZATION N/A 12/25/2015   Procedure: Left Heart Cath and Coronary Angiography;  Surgeon: Minna Merritts, MD;  Location: [facility];  Service: Cardiovascular;  Laterality: N/A;   CARDIAC CATHETERIZATION N/A 12/25/2015   Procedure: Coronary Stent Intervention;  Surgeon: Yolonda Kida, MD;  Location: [facility];  Service: Cardiovascular;  Laterality:  N/A;   CARDIAC CATHETERIZATION N/A 03/10/2016   Procedure: Left Heart Cath and Coronary Angiography;  Surgeon: Wellington Hampshire, MD;  Location: [facility];  Service: Cardiovascular;  Laterality: N/A;   CARDIAC CATHETERIZATION N/A 04/06/2016   Procedure: Left Heart Cath and Coronary Angiography;  Surgeon: Belva Crome, MD;  Location: [facility];  Service: Cardiovascular;  Laterality: N/A;   CIRCUMCISION     CORONARY ARTERY BYPASS GRAFT      Social history:  reports that he has never smoked. He has never used smokeless tobacco. He reports that he does not drink alcohol and does not use drugs.   Allergies  Allergen Reactions   Bupropion Nausea Only   Ozempic (0.25 Or 0.5 Mg-Dose) [Semaglutide(0.25 Or 0.'5mg'$ -Dos)] Nausea And Vomiting   Atorvastatin Other (See Comments)    Body aches Similar effect with rosuvastatin 40 mg twice weekly    Family History  Problem Relation Age of Onset   Alzheimer's disease Mother    Emphysema Mother    Diabetes Father    Heart disease Father        MI   Cancer Brother        ? Neck cancer      Prior to Admission medications   Medication Sig Start Date End Date Taking? Authorizing Provider  aspirin 81 MG EC tablet Take 1 tablet (81 mg total) by mouth daily. 01/12/16   Theora Gianotti, NP  carvedilol (COREG) 6.25 MG tablet TAKE 1 TABLET BY MOUTH ONCE DAILY 10/03/22   Minna Merritts, MD  cholecalciferol (VITAMIN D3) 25 MCG (1000 UNIT) tablet Take 2,000 Units by mouth daily.    [provider]  clopidogrel (PLAVIX) 75 MG tablet TAKE 1 TABLET BY MOUTH ONCE DAILY WITH BREAKFAST 01/03/22   Bedsole, Amy E, MD  Continuous Blood Gluc Receiver (FREESTYLE LIBRE 2 READER) DEVI USE WITH SENSORS TO MONITOR SUGAR CONTINUOUSLY 06/30/22   Bedsole, Amy E, MD  Continuous Blood Gluc Sensor (FREESTYLE LIBRE 2 SENSOR) MISC APPLY SENSOR EVERY 14 DAYS TO MONITOR SUGAR CONTINOUSLY 11/03/22   Bedsole, Amy E, MD  ezetimibe (ZETIA) 10 MG tablet TAKE 1 TABLET BY MOUTH ONCE DAILY 10/12/22   Minna Merritts, MD  furosemide (LASIX) 40 MG tablet TAKE 1 TABLET BY MOUTH ONCE A DAY  CAN TAKE A 2ND DAILY DOSE AS NEEDED. 09/30/22   Bedsole, Amy E, MD  HYDROcodone-acetaminophen (NORCO) 5-325 MG tablet Take 1 tablet by mouth 2 (two) times daily as needed for moderate pain. 08/18/22   Bedsole, Amy E,  MD  HYDROcodone-acetaminophen (NORCO/VICODIN) 5-325 MG tablet TAKE 1 TABLET BY MOUTH TWICE (2) DAILY AS NEEDED FOR MODERATE PAIN 08/18/22   Bedsole, Amy E, MD  HYDROcodone-acetaminophen (NORCO/VICODIN) 5-325 MG tablet TAKE 1 TABLET BY MOUTH TWICE A DAY AS NEEDED FOR MODERATE PAIN 08/18/22   Bedsole, Amy E, MD  hydrOXYzine (ATARAX) 10 MG tablet TAKE 1 TABLET BY MOUTH 3 TIMES DAILY AS NEEDED FOR ANXIETY 10/12/22   Bedsole, Amy E, MD  insulin aspart (NOVOLOG FLEXPEN) 100 UNIT/ML FlexPen 13 units in AM (scheduled) and 3 units PRN in evening Patient taking differently: Inject into the skin. 3-5 units in PM 04/03/22   Bedsole, Amy E, MD  isosorbide mononitrate (IMDUR) 30 MG 24 hr tablet TAKE 1 TABLET BY MOUTH TWICE DAILY 09/21/22   Minna Merritts, MD  losartan (COZAAR) 50 MG tablet TAKE 1 TABLET BY MOUTH ONCE DAILY 10/03/22   Jinny Sanders, MD  nitroGLYCERIN (NITROSTAT) 0.4 MG SL tablet DISSOLVE 1 TABLET UNDER TONGUE AS NEEDEDFOR CHEST PAIN. MAY REPEAT 5 MINUTES APART 3 TIMES IF NEEDED 02/25/21   Minna Merritts, MD  Semaglutide, 2 MG/DOSE, 8 MG/3ML SOPN Inject 2 mg as directed once a week. 10/18/22   Bedsole, Amy E, MD  TRESIBA FLEXTOUCH 100 UNIT/ML FlexTouch Pen INJECT 50 UNITS INTO THE SKIN DAILY 11/03/22   Bedsole, Amy E, MD  triamcinolone cream (KENALOG) 0.5 % Apply 1 Application topically 2 (two) times daily. 05/12/22   Bedsole, Amy E, MD  TRUEPLUS 5-BEVEL PEN NEEDLES 31G X 6 MM MISC USE TO INJECT INSULIN 3 TIMES A DAY 02/15/22   Bedsole, Amy E, MD  venlafaxine XR (EFFEXOR-XR) 150 MG 24 hr capsule TAKE 1 CAPSULE BY MOUTH DAILY WITH BREAKFAST. TAKE WITH EFFEXOR XR '75MG'$  FORA TOTAL OF '225MG'$  10/03/22   Bedsole, Amy E, MD  venlafaxine XR (EFFEXOR-XR) 75 MG 24 hr capsule TAKE 1 CAPSULE BY MOUTH ONCE DAILY. TAKEIN ADDITION TO THE 150 MG CAPSULE FOR A TOTAL DOSE OF '225MG'$  DAILY 10/03/22   Bedsole, Amy E, MD  vitamin B-12 (CYANOCOBALAMIN) 1000 MCG tablet Take 1,000 mcg by mouth daily.    [provider]     Physical Exam: Vitals:   11/05/22 0831 11/05/22 1130 11/05/22 1220 11/05/22 1245  BP:  (!) 164/88  132/74  Pulse:  94  87  Resp:  20  14  Temp:   98.6 F (37 C)   TempSrc:   Oral   SpO2:  99%  100%  Weight: 114.3 kg     Height: '5\' 6"'$  (1.676 m)       Constitutional: Obese male, in mild respiratory distress while talking full sentences. Eyes: PERRL, lids and conjunctivae normal ENMT: Mucous membranes are moist. Posterior pharynx clear of any exudate or lesions.Normal dentition.  Neck: normal, supple, no masses, no thyromegaly Respiratory: clear to auscultation bilaterally, no wheezing, no crackles. Normal respiratory effort. No accessory muscle use.  Cardiovascular: Regular rate and rhythm, somewhat tachycardic, no murmurs / rubs / gallops.  1+ pitting lower extremity edema. 2+ pedal pulses. No carotid bruits.  Abdomen: no tenderness, no masses palpated. No hepatosplenomegaly. Bowel sounds positive.  Musculoskeletal: no clubbing / cyanosis. No joint deformity upper and lower extremities. Good ROM, no contractures. Normal muscle tone.  Neurologic: CN 2-12 grossly intact. Sensation intact, DTR normal. Strength 5/5 in all 4.  Psychiatric: Normal judgment and insight. Alert and oriented x 3. Normal mood.  SKIN/catheters: no rashes, lesions, ulcers-does have some chronic stasis dermatitis changes in lower extremities. No induration  Labs on Admission: I have personally reviewed following labs and imaging studies  CBC: Recent Labs  Lab 11/05/22 0824  WBC 6.8  NEUTROABS 6.2  HGB 11.1*  HCT 35.6*  MCV 83.6  PLT [id number]   Basic Metabolic Panel: Recent Labs  Lab 11/05/22 0824  NA 137  K 3.8  CL 103  CO2 20*  GLUCOSE 299*  BUN 20  CREATININE 1.12  CALCIUM 8.5*  MG 1.9   GFR: Estimated Creatinine Clearance: 70.8 mL/min (by C-G formula based on SCr of 1.12 mg/dL). Recent Labs  Lab 11/05/22 0824 11/05/22 0839 11/05/22 1055  WBC 6.8  --   --   LATICACIDVEN  --  2.5* 1.9    Liver Function Tests: Recent Labs  Lab 11/05/22 0824  AST 31  ALT 16  ALKPHOS 73  BILITOT 0.8  PROT 7.2  ALBUMIN 3.5   Recent Labs  Lab 11/05/22  [id number]  LIPASE 32   No results for input(s): "AMMONIA" in the last 168 hours. Coagulation Profile: Recent Labs  Lab 11/05/22 0839  INR 1.2   Cardiac Enzymes: No results for input(s): "CKTOTAL", "CKMB", "CKMBINDEX", "TROPONINI" in the last 168 hours. BNP (last 3 results) No results for input(s): "PROBNP" in the last 8760 hours. HbA1C: No results for input(s): "HGBA1C" in the last 72 hours. CBG: No results for input(s): "GLUCAP" in the last 168 hours. Lipid Profile: No results for input(s): "CHOL", "HDL", "LDLCALC", "TRIG", "CHOLHDL", "LDLDIRECT" in the last 72 hours. Thyroid Function Tests: Recent Labs    11/05/22 0825  TSH 2.218   Anemia Panel: No results for input(s): "VITAMINB12", "FOLATE", "FERRITIN", "TIBC", "IRON", "RETICCTPCT" in the last 72 hours. Urine analysis:    Component Value Date/Time   COLORURINE YELLOW 11/05/2022 0957   APPEARANCEUR CLEAR 11/05/2022 0957   LABSPEC 1.023 11/05/2022 0957   PHURINE 5.0 11/05/2022 0957   GLUCOSEU >=500 (A) 11/05/2022 0957   HGBUR NEGATIVE 11/05/2022 0957   BILIRUBINUR NEGATIVE 11/05/2022 0957   KETONESUR NEGATIVE 11/05/2022 0957   PROTEINUR NEGATIVE 11/05/2022 0957   UROBILINOGEN 1.0 05/19/2009 1218   NITRITE NEGATIVE 11/05/2022 0957   LEUKOCYTESUR NEGATIVE 11/05/2022 0957    Radiological Exams on Admission: Personally reviewed  CT Angio Chest PE W and/or Wo Contrast  Result Date: 11/05/2022 CLINICAL DATA:  Positive D-dimer, chest pain, short of breath, hypoxia, possible sepsis EXAM: CT ANGIOGRAPHY CHEST WITH CONTRAST TECHNIQUE: Multidetector CT imaging of the chest was performed using the standard protocol during bolus administration of intravenous contrast. Multiplanar CT image reconstructions and MIPs were obtained to evaluate the vascular anatomy. RADIATION DOSE  REDUCTION: This exam was performed according to the departmental dose-optimization program which includes automated exposure control, adjustment of the mA and/or kV according to patient size and/or use of iterative reconstruction technique. CONTRAST:  3m OMNIPAQUE IOHEXOL 350 MG/ML SOLN COMPARISON:  09/01/2009, 11/05/2022 FINDINGS: Cardiovascular: This is a technically adequate evaluation of the pulmonary vasculature. No filling defects or pulmonary emboli. Mild cardiomegaly with prominent left ventricular dilatation. Normal caliber of the thoracic aorta. Atherosclerosis of the aorta and coronary vasculature. Mediastinum/Nodes: Mediastinal adenopathy measuring up to 16 mm in the subcarinal station, nonspecific. No pathologic hilar or axillary lymph nodes. Thyroid, trachea, and esophagus are grossly unremarkable. Lungs/Pleura: There are small bilateral pleural effusions, volume estimated less than 500 cc each. Bilateral perihilar ground-glass airspace disease most compatible with edema. No pneumothorax. The central airways are patent. Upper Abdomen: No acute abnormality. Musculoskeletal: No acute or destructive bony lesions. Reconstructed images demonstrate no additional findings. Review of the MIP images confirms the above findings. IMPRESSION: 1. No evidence of pulmonary embolus. 2. Cardiomegaly, bilateral perihilar ground-glass airspace disease, and bilateral pleural effusions most consistent with congestive heart failure. 3. Nonspecific mediastinal adenopathy, which may be reactive. 4. Aortic Atherosclerosis (ICD10-I70.0). Coronary artery atherosclerosis. Electronically Signed   By: MRanda NgoM.D.   On: 11/05/2022 10:56   DG Chest Portable 1 View  Result Date: 11/05/2022 CLINICAL DATA:  72year old male with chest pain, shortness of breath, hypoxia, possible sepsis. EXAM: PORTABLE CHEST 1 VIEW COMPARISON:  Chest radiographs 09/15/2021 and earlier. FINDINGS: Portable AP upright view at 0842 hours. Stable  cardiomegaly and mediastinal contours. No pneumothorax. No consolidation. Symmetric increased bilateral pulmonary interstitial opacity and veiling opacity at the right lateral costophrenic angle. No definite left pleural effusion. No acute osseous abnormality identified. Visualized tracheal air column is within normal limits. Negative visible bowel gas. IMPRESSION: Chronic cardiomegaly  with symmetric increased interstitial opacity most likely pulmonary edema, with probable small right pleural effusion. Viral/atypical respiratory infection felt less likely. Electronically Signed   By: Genevie Ann M.D.   On: 11/05/2022 08:54    EKG: Independently reviewed. sinus tachycardia with repolarization abnormality in lateral leads, QTc 409 ms.      Assessment and Plan:   Principal Problem:   NSTEMI (non-ST elevated myocardial infarction) ([place]) Active Problems:   Acute on chronic diastolic CHF (congestive heart failure) ([facility])   Sepsis ([place])   COVID-19 virus infection   Hyperlipidemia associated with type 2 diabetes mellitus ([place])   B12 deficiency   Diabetic retinopathy ([place])   Hypertensive heart disease with heart failure ([place])   Coronary artery disease of native artery of native heart with stable angina pectoris ([place])   Morbid obesity ([place])   Adjustment disorder with mixed anxiety and depressed mood   Chronic midline low back pain    1.Sepsis due to acute COVID-19 viral illness: Patient febrile, tachycardic, tachypneic with elevated lactic acid level on presentation.  Tested positive for COVID-19.  Denies any known exposure.  Patient states he was partially vaccinated with 2 doses in 2020, never got a booster dose.  Given mild hypoxia and severe symptoms, will initiate treatment with MDI, Decadron and remdesivir. F/U serial inflammatory markers.   2.  Non-STEMI/acute on chronic diastolic CHF: Patient with marked elevation in troponin levels likely type II in the setting of problem #1 versus viral  myocarditis.  No complaints of chest pain currently.  EKG as above.  Appreciate cardiology evaluation and input.  Recommended medical management with heparin drip, beta-blockers, resume home DAPT.  Patient received 1 dose of IV Lasix in the ED.  Repeat echo pending.  Continue diuretics 40 mg Lasix IV daily for now while monitoring I's and O's.  Resume home medications including beta-blockers, losartan, nitrates and cardiology added Aldactone.  Nitro SL as needed available.  3. Acute hypoxic respiratory failure: Secondary to problem #1 and problem #2.  Per ED physician patient was tachypneic and O2 sat was borderline 90 to 91% on room air prompting placement of 2 L of nasal cannula O2 also in the setting of problem #2. MDI prn.D-dimer elevated but CTA -ve for PE  4.  Nausea/vomiting: Could have contributed to problem #3 due to aspiration as patient reports development of dyspnea after nausea and vomiting.  No definite pneumonia on chest x-ray.  Will hold off on antibiotics for now and treat for problem 1 with antivirals.  Check procalcitonin levels.  Patient also feels all his symptoms started after using Ozempic-will hold off on this medication and treat with prior insulin regimen.  Denies any new rashes.  Will enter Ozempic under allergy/intolerance.  IV PPI and supportive treatment for now.  5.  Diabetes mellitus with complications of hyperlipidemia, retinopathy: Patient's last hemoglobin A1c was 7.1 in December 2023.  He states his PCP recommended changing insulin regimen (Tresiba 50 units daily, with Premeal short acting insulin) to Ozempic to aid in weight loss and better control.  Given intolerance as described in problem #4, will hold off on Ozempic and resume Tresiba at a lower dose while in the hospital.  Premeal short-acting insulin and sliding scale insulin ordered as well.  Will recheck A1c.  6.  Hypertension: Resume home medications.  7.  Hyperlipidemia: Resume home medications  8.  Vitamin  B12 deficiency: Resume home supplements  9. Depression/anxiety: Resume home medications-venlafaxine.  DVT prophylaxis: On IV heparin  COVID screen: Positive  Code Status: Full code as discussed with patient   .Health care proxy would be his daughters Alyse Low or Lexine Baton.    Patient/Family Communication: Discussed with patient and daughter Lexine Baton at bedside, all questions answered to satisfaction.  Consults called: Cardiology Admission status :I certify that at the point of admission it is my clinical judgment that the patient will require inpatient hospital care spanning beyond 2 midnights from the point of admission due to high intensity of service and high frequency of surveillance required.Inpatient status is judged to be reasonable and necessary in order to provide the required intensity of service to ensure the patient's safety. [name] presenting symptoms, physical exam findings, and initial radiographic and laboratory data in the context of their chronic comorbidities is felt to place them at high risk for further clinical deterioration. The following factors support the patient status of inpatient : Acute COVID-19 illness with non-STEMI requiring IV steroids/antiviral/IV heparin.     Guilford Shi MD [facility] Pager in [place]  If 7PM-7AM, please contact night-coverage [email or web address]   11/05/2022, 2:11 PM

## 2022-11-05 NOTE — ED Triage Notes (Signed)
Pt BIB [facility] from home for exertional dsypnea and new onset of afib.  PT received first Ozempic shot on Wed. Has been experiencing NV since.  EMS assessesed him yesterday for SOB. Not transport.  Today on scene, 92% on RA, 84% RA when walking. BP 100/60 on scene.  Given 150 NS, 117/70 on arrival.  Pt is DM and has not taken long or short acting insulin in 3 days d/t NV.

## 2022-11-05 NOTE — ED Notes (Signed)
ED TO INPATIENT HANDOFF REPORT  ED Nurse Name and Phone #: [phone]   S Name/Age/Gender [name] 72 y.o. male Room/Bed: 041C/041C  Code Status   Code Status: Full Code  Home/SNF/Other Home Patient oriented to: self, place, time, and situation Is this baseline? Yes   Triage Complete: Triage complete  Chief Complaint NSTEMI (non-ST elevated myocardial infarction) ([place]) [I21.4]  Triage Note Pt BIB [facility] from home for exertional dsypnea and new onset of afib.  PT received first Ozempic shot on Wed. Has been experiencing NV since.  EMS assessesed him yesterday for SOB. Not transport.  Today on scene, 92% on RA, 84% RA when walking. BP 100/60 on scene.  Given 150 NS, 117/70 on arrival.  Pt is DM and has not taken long or short acting insulin in 3 days d/t NV.    Allergies Allergies  Allergen Reactions   Bupropion Nausea Only   Ozempic (0.25 Or 0.5 Mg-Dose) [Semaglutide(0.25 Or 0.'5mg'$ -Dos)] Nausea And Vomiting   Atorvastatin Other (See Comments)    Body aches Similar effect with rosuvastatin 40 mg twice weekly    Level of Care/Admitting Diagnosis ED Disposition     ED Disposition  Admit   Condition  --   [place]: [facility] [[id number]]  Level of Care: Progressive [102]  Admit to Progressive based on following criteria: CARDIOVASCULAR & THORACIC of moderate stability with acute coronary syndrome symptoms/low risk myocardial infarction/hypertensive urgency/arrhythmias/heart failure potentially compromising stability and stable post cardiovascular intervention patients.  May admit patient to [facility] or [facility] if equivalent level of care is available:: Yes  Covid Evaluation: Confirmed COVID Positive  Diagnosis: NSTEMI (non-ST elevated myocardial infarction) [facility]) [id number]  Admitting Physician: Guilford Shi [id number]  Attending Physician: Guilford Shi [id number]  Certification:: I certify this patient will need inpatient  services for at least 2 midnights  Estimated Length of Stay: 3          B Medical/Surgery History Past Medical History:  Diagnosis Date   Back injury 02/2002   worker's comp   CHF (congestive heart failure) ([place])    Coronary artery disease, non-occlusive    a. cath 2002 with no sig CAD;  b. cath 2008 normal LM, LAD, LCx, p&dRCA 20-30%, PDA 30%; c.11/2015 NSTEMI/PCI: LM nl, LAD 95p (2.5x15 Xience DES), LCX nl, RCA 100p/m w/ L->R collats, EF 55-65% c. NSTEMI (02/2016) with no culprit leision, switched to [place].  d. NSTEMI 03/2016: again, no culprit lesion and switched back to plavix 2/2 SOB with Brilnta.     Depression    Diabetes mellitus type 2, insulin dependent ([place])    Hyperlipemia    Hypertensive heart disease    Kidney stones    Morbid obesity ([place])    Osteoarthritis    Snoring    Past Surgical History:  Procedure Laterality Date   CARDIAC CATHETERIZATION  09/29/2000   diffuse LAD 30% LCA  EF 50-60%   CARDIAC CATHETERIZATION  06/30/2007   no significant CAD   CARDIAC CATHETERIZATION N/A 12/25/2015   Procedure: Left Heart Cath and Coronary Angiography;  Surgeon: Minna Merritts, MD;  Location: [facility];  Service: Cardiovascular;  Laterality: N/A;   CARDIAC CATHETERIZATION N/A 12/25/2015   Procedure: Coronary Stent Intervention;  Surgeon: Yolonda Kida, MD;  Location: [facility];  Service: Cardiovascular;  Laterality: N/A;   CARDIAC CATHETERIZATION N/A 03/10/2016   Procedure: Left Heart Cath and Coronary Angiography;  Surgeon: Rayneisha Bouza [place], MD;  Location: [facility];  Service: Cardiovascular;  Laterality: N/A;   CARDIAC CATHETERIZATION N/A 04/06/2016   Procedure: Left Heart Cath and Coronary Angiography;  Surgeon: Belva Crome, MD;  Location: [facility];  Service: Cardiovascular;  Laterality: N/A;   CIRCUMCISION     CORONARY ARTERY BYPASS GRAFT       A IV Location/Drains/Wounds Patient Lines/Drains/Airways Status      Active Line/Drains/Airways     Name Placement date Placement time Site Days   Peripheral IV 11/05/22 20 G Left Antecubital 11/05/22  0857  Antecubital  less than 1   Peripheral IV 11/05/22 20 G Right Wrist 11/05/22  0858  Wrist  less than 1            Intake/Output Last 24 hours  Intake/Output Summary (Last 24 hours) at 11/05/2022 1738 Last data filed at 11/05/2022 1400 Gross per 24 hour  Intake 669.76 ml  Output --  Net 669.76 ml    Labs/Imaging Results for orders placed or performed during the hospital encounter of 11/05/22 (from the past 48 hour(s))  CBC with Differential     Status: Abnormal   Collection Time: 11/05/22  8:24 AM  Result Value Ref Range   WBC 6.8 4.0 - 10.5 K/uL   RBC 4.26 4.22 - 5.81 MIL/uL   Hemoglobin 11.1 (L) 13.0 - 17.0 g/dL   HCT 35.6 (L) 39.0 - 52.0 %   MCV 83.6 80.0 - 100.0 fL   MCH 26.1 26.0 - 34.0 pg   MCHC 31.2 30.0 - 36.0 g/dL   RDW 17.8 (H) 11.5 - 15.5 %   Platelets 202 150 - 400 K/uL   nRBC 0.0 0.0 - 0.2 %   Neutrophils Relative % 91 %   Neutro Abs 6.2 1.7 - 7.7 K/uL   Lymphocytes Relative 4 %   Lymphs Abs 0.3 (L) 0.7 - 4.0 K/uL   Monocytes Relative 5 %   Monocytes Absolute 0.3 0.1 - 1.0 K/uL   Eosinophils Relative 0 %   Eosinophils Absolute 0.0 0.0 - 0.5 K/uL   Basophils Relative 0 %   Basophils Absolute 0.0 0.0 - 0.1 K/uL   Immature Granulocytes 0 %   Abs Immature Granulocytes 0.02 0.00 - 0.07 K/uL    Comment: Performed at [facility], [address], [place], [place] [address]  Comprehensive metabolic panel     Status: Abnormal   Collection Time: 11/05/22  8:24 AM  Result Value Ref Range   Sodium 137 135 - 145 mmol/L   Potassium 3.8 3.5 - 5.1 mmol/L   Chloride 103 98 - 111 mmol/L   CO2 20 (L) 22 - 32 mmol/L   Glucose, Bld 299 (H) 70 - 99 mg/dL    Comment: Glucose reference range applies only to samples taken after fasting for at least 8 hours.   BUN 20 8 - 23 mg/dL   Creatinine, Ser 1.12 0.61 - 1.24 mg/dL   Calcium  8.5 (L) 8.9 - 10.3 mg/dL   Total Protein 7.2 6.5 - 8.1 g/dL   Albumin 3.5 3.5 - 5.0 g/dL   AST 31 15 - 41 U/L   ALT 16 0 - 44 U/L   Alkaline Phosphatase 73 38 - 126 U/L   Total Bilirubin 0.8 0.3 - 1.2 mg/dL   GFR, Estimated >60 >60 mL/min    Comment: (NOTE) Calculated using the CKD-EPI Creatinine Equation (2021)    Anion gap 14 5 - 15    Comment: Performed at  [facility], [place] [address] [place], [place] [address]  Brain natriuretic peptide     Status: Abnormal   Collection Time: 11/05/22  8:24 AM  Result Value Ref Range   B Natriuretic Peptide 1,240.5 (H) 0.0 - 100.0 pg/mL    Comment: Performed at [facility] [address], [place], [place] [address]  Troponin I (High Sensitivity)     Status: Abnormal   Collection Time: 11/05/22  8:24 AM  Result Value Ref Range   Troponin I (High Sensitivity) 1,268 (HH) <18 ng/L    Comment: CRITICAL RESULT CALLED TO, READ BACK BY AND VERIFIED WITH J,NEWTON RN '@0950'$  11/05/22 E,[place]ENTON (NOTE) Elevated high sensitivity troponin I (hsTnI) values and significant  changes across serial measurements may suggest ACS but many other  chronic and acute conditions are known to elevate hsTnI results.  Refer to the "Links" section for chest pain algorithms and additional  guidance. Performed at [facility], [place] [address] [place], [place] [address]   D-dimer, quantitative     Status: Abnormal   Collection Time: 11/05/22  8:24 AM  Result Value Ref Range   D-Dimer, Quant 1.56 (H) 0.00 - 0.50 ug/mL-FEU    Comment: (NOTE) At the manufacturer cut-off value of 0.5 g/mL FEU, this assay has a negative predictive value of 95-100%.This assay is intended for use in conjunction with a clinical pretest probability (PTP) assessment model to exclude pulmonary embolism (PE) and deep venous thrombosis (DVT) in outpatients suspected of PE or DVT. Results should be correlated with clinical presentation. Performed at [facility], [place]  [address] [place], [place] [address]   Lipase, blood     Status: None   Collection Time: 11/05/22  8:24 AM  Result Value Ref Range   Lipase 32 11 - 51 U/L    Comment: Performed at [facility] [address], [place], [place] [address]  Magnesium     Status: None   Collection Time: 11/05/22  8:24 AM  Result Value Ref Range   Magnesium 1.9 1.7 - 2.4 mg/dL    Comment: Performed at [facility], [place] [address] [place], [place] [address]  TSH     Status: None   Collection Time: 11/05/22  8:25 AM  Result Value Ref Range   TSH 2.218 0.350 - 4.500 uIU/mL    Comment: Performed by a 3rd Generation assay with a functional sensitivity of <=0.01 uIU/mL. Performed at [facility], [place] [address], [place], [place] [address]   Lactic acid, plasma     Status: Abnormal   Collection Time: 11/05/22  8:39 AM  Result Value Ref Range   Lactic Acid, Venous 2.5 (HH) 0.5 - 1.9 mmol/L    Comment: CRITICAL RESULT CALLED TO, READ BACK BY AND VERIFIED WITH J,NEWTON RN '@0950'$  11/05/22 E,BENTON Performed at [facility], [place] [address], [place], [place] [address]   Protime-INR     Status: None   Collection Time: 11/05/22  8:39 AM  Result Value Ref Range   Prothrombin Time 14.9 11.4 - 15.2 seconds   INR 1.2 0.8 - 1.2    Comment: (NOTE) INR goal varies based on device and disease states. Performed at [facility], [place] [address] [place], [place] [address]   APTT     Status: None   Collection Time: 11/05/22  8:39 AM  Result Value Ref Range   aPTT 32 24 - 36 seconds  Comment: Performed at [facility], [place] [address] [place], [place] [address]  Resp panel by RT-PCR (RSV, Flu A&B, Covid) Anterior Nasal Swab     Status: Abnormal   Collection Time: 11/05/22  8:50 AM   Specimen: Anterior Nasal Swab  Result Value Ref Range   SARS Coronavirus 2 by RT PCR POSITIVE (A) NEGATIVE   Influenza A by PCR NEGATIVE NEGATIVE   Influenza B by PCR NEGATIVE NEGATIVE    Comment: (NOTE) The  Xpert Xpress SARS-CoV-2/FLU/RSV plus assay is intended as an aid in the diagnosis of influenza from Nasopharyngeal swab specimens and should not be used as a sole basis for treatment. Nasal washings and aspirates are unacceptable for Xpert Xpress SARS-CoV-2/FLU/RSV testing.  Fact Sheet for Patients: EntrepreneurPulse.com.au  Fact Sheet for Healthcare Providers: IncredibleEmployment.be  This test is not yet approved or cleared by the [place] FDA and has been authorized for detection and/or diagnosis of SARS-CoV-2 by FDA under an Emergency Use Authorization (EUA). This EUA will remain in effect (meaning this test can be used) for the duration of the COVID-19 declaration under Section 564(b)(1) of the Act, 21 U.S.C. section 360bbb-3(b)(1), unless the authorization is terminated or revoked.     Resp Syncytial Virus by PCR NEGATIVE NEGATIVE    Comment: (NOTE) Fact Sheet for Patients: EntrepreneurPulse.com.au  Fact Sheet for Healthcare Providers: IncredibleEmployment.be  This test is not yet approved or cleared by the [place] FDA and has been authorized for detection and/or diagnosis of SARS-CoV-2 by FDA under an Emergency Use Authorization (EUA). This EUA will remain in effect (meaning this test can be used) for the duration of the COVID-19 declaration under Section 564(b)(1) of the Act, 21 U.S.C. section 360bbb-3(b)(1), unless the authorization is terminated or revoked.  Performed at [facility], [place] [address], [place], [place] [address]   Urinalysis, Routine w reflex microscopic -Urine, Clean Catch     Status: Abnormal   Collection Time: 11/05/22  9:57 AM  Result Value Ref Range   Color, Urine YELLOW YELLOW   APPearance CLEAR CLEAR   Specific Gravity, Urine 1.023 1.005 - 1.030   pH 5.0 5.0 - 8.0   Glucose, UA >=500 (A) NEGATIVE mg/dL   Hgb urine dipstick NEGATIVE NEGATIVE    Bilirubin Urine NEGATIVE NEGATIVE   Ketones, ur NEGATIVE NEGATIVE mg/dL   Protein, ur NEGATIVE NEGATIVE mg/dL   Nitrite NEGATIVE NEGATIVE   Leukocytes,Ua NEGATIVE NEGATIVE   RBC / HPF 0-5 0 - 5 RBC/hpf   WBC, UA 0-5 0 - 5 WBC/hpf   Bacteria, UA NONE SEEN NONE SEEN   Squamous Epithelial / HPF 0-5 0 - 5 /HPF   Mucus PRESENT     Comment: Performed at [facility], [place] [address] [place], [place] [address]  Lactic acid, plasma     Status: None   Collection Time: 11/05/22 10:55 AM  Result Value Ref Range   Lactic Acid, Venous 1.9 0.5 - 1.9 mmol/L    Comment: Performed at [facility] [address], [place], [place] [address]  Troponin I (High Sensitivity)     Status: Abnormal   Collection Time: 11/05/22 10:55 AM  Result Value Ref Range   Troponin I (High Sensitivity) 1,880 (HH) <18 ng/L    Comment: CRITICAL VALUE NOTED. VALUE IS CONSISTENT WITH PREVIOUSLY REPORTED/CALLED VALUE (NOTE) Elevated high sensitivity troponin I (hsTnI) values and significant  changes across serial measurements may suggest ACS but many other  chronic and acute  conditions are known to elevate hsTnI results.  Refer to the "Links" section for chest pain algorithms and additional  guidance. Performed at [facility], [place] [address] [place], [place] [address]    CT Angio Chest PE W and/or Wo Contrast  Result Date: 11/05/2022 CLINICAL DATA:  Positive D-dimer, chest pain, short of breath, hypoxia, possible sepsis EXAM: CT ANGIOGRAPHY CHEST WITH CONTRAST TECHNIQUE: Multidetector CT imaging of the chest was performed using the standard protocol during bolus administration of intravenous contrast. Multiplanar CT image reconstructions and MIPs were obtained to evaluate the vascular anatomy. RADIATION DOSE REDUCTION: This exam was performed according to the departmental dose-optimization program which includes automated exposure control, adjustment of the mA and/or kV according to patient size and/or use  of iterative reconstruction technique. CONTRAST:  4m OMNIPAQUE IOHEXOL 350 MG/ML SOLN COMPARISON:  09/01/2009, 11/05/2022 FINDINGS: Cardiovascular: This is a technically adequate evaluation of the pulmonary vasculature. No filling defects or pulmonary emboli. Mild cardiomegaly with prominent left ventricular dilatation. Normal caliber of the thoracic aorta. Atherosclerosis of the aorta and coronary vasculature. Mediastinum/Nodes: Mediastinal adenopathy measuring up to 16 mm in the subcarinal station, nonspecific. No pathologic hilar or axillary lymph nodes. Thyroid, trachea, and esophagus are grossly unremarkable. Lungs/Pleura: There are small bilateral pleural effusions, volume estimated less than 500 cc each. Bilateral perihilar ground-glass airspace disease most compatible with edema. No pneumothorax. The central airways are patent. Upper Abdomen: No acute abnormality. Musculoskeletal: No acute or destructive bony lesions. Reconstructed images demonstrate no additional findings. Review of the MIP images confirms the above findings. IMPRESSION: 1. No evidence of pulmonary embolus. 2. Cardiomegaly, bilateral perihilar ground-glass airspace disease, and bilateral pleural effusions most consistent with congestive heart failure. 3. Nonspecific mediastinal adenopathy, which may be reactive. 4. Aortic Atherosclerosis (ICD10-I70.0). Coronary artery atherosclerosis. Electronically Signed   By: MRanda NgoM.D.   On: 11/05/2022 10:56   DG Chest Portable 1 View  Result Date: 11/05/2022 CLINICAL DATA:  72year old male with chest pain, shortness of breath, hypoxia, possible sepsis. EXAM: PORTABLE CHEST 1 VIEW COMPARISON:  Chest radiographs 09/15/2021 and earlier. FINDINGS: Portable AP upright view at 0842 hours. Stable cardiomegaly and mediastinal contours. No pneumothorax. No consolidation. Symmetric increased bilateral pulmonary interstitial opacity and veiling opacity at the right lateral costophrenic angle. No  definite left pleural effusion. No acute osseous abnormality identified. Visualized tracheal air column is within normal limits. Negative visible bowel gas. IMPRESSION: Chronic cardiomegaly with symmetric increased interstitial opacity most likely pulmonary edema, with probable small right pleural effusion. Viral/atypical respiratory infection felt less likely. Electronically Signed   By: HGenevie AnnM.D.   On: 11/05/2022 08:54    Pending Labs Unresulted Labs (From admission, onward)     Start     Ordered   11/06/22 0500  Heparin level (unfractionated)  Daily,   R     See Hyperspace for full Linked Orders Report.   11/05/22 1134   11/06/22 0500  CBC  Daily,   R     See Hyperspace for full Linked Orders Report.   11/05/22 1134   11/06/22 [id number] Basic metabolic panel  Daily,   R     Comments: As Scheduled for 5 days    11/05/22 1350   11/06/22 0500  D-dimer, quantitative  Daily,   R      11/05/22 1412   11/06/22 0500  Ferritin  Daily,   R      11/05/22 1413   11/05/22 2000  Heparin level (unfractionated)  Once-Timed,   URGENT        11/05/22 1134   11/05/22 1414  Sedimentation rate  Daily,   R      11/05/22 1413   11/05/22 1414  C-reactive protein  Once,   R        11/05/22 1413   11/05/22 1413  Procalcitonin  Once,   R       References:    Procalcitonin Lower Respiratory Tract Infection AND Sepsis Procalcitonin Algorithm   11/05/22 1412   11/05/22 1413  Lactate dehydrogenase  Once,   R        11/05/22 1412   11/05/22 0840  Urine Culture (for pregnant, neutropenic or urologic patients or patients with an indwelling urinary catheter)  (Septic presentation on arrival (screening labs, nursing and treatment orders for obvious sepsis))  Once,   URGENT       Question:  Indication  Answer:  Sepsis   11/05/22 0839   11/05/22 0825  Blood culture (routine x 2)  BLOOD CULTURE X 2,   R (with STAT occurrences)      11/05/22 0825            Vitals/Pain Today's Vitals   11/05/22 0831 11/05/22  1130 11/05/22 1220 11/05/22 1245  BP:  (!) 164/88  132/74  Pulse:  94  87  Resp:  20  14  Temp:   98.6 F (37 C)   TempSrc:   Oral   SpO2:  99%  100%  Weight: 114.3 kg     Height: '5\' 6"'$  (1.676 m)     PainSc: 0-No pain       Isolation Precautions Airborne and Contact precautions  Medications Medications  ceFEPIme (MAXIPIME) 2 g in sodium chloride 0.9 % 100 mL IVPB (has no administration in time range)  heparin ADULT infusion 100 units/mL (25000 units/229m) (1,050 Units/hr Intravenous New Bag/Given 11/05/22 1153)  carvedilol (COREG) tablet 6.25 mg (has no administration in time range)  aspirin EC tablet 81 mg (has no administration in time range)  spironolactone (ALDACTONE) tablet 25 mg (25 mg Oral Given 11/05/22 1356)  HYDROcodone-acetaminophen (NORCO/VICODIN) 5-325 MG per tablet 1 tablet (has no administration in time range)  ezetimibe (ZETIA) tablet 10 mg (has no administration in time range)  furosemide (LASIX) injection 40 mg (has no administration in time range)  isosorbide mononitrate (IMDUR) 24 hr tablet 30 mg (has no administration in time range)  nitroGLYCERIN (NITROSTAT) SL tablet 0.4 mg (has no administration in time range)  hydrOXYzine (ATARAX) tablet 10 mg (has no administration in time range)  venlafaxine XR (EFFEXOR-XR) 24 hr capsule 75 mg (has no administration in time range)  venlafaxine XR (EFFEXOR-XR) 24 hr capsule 150 mg (has no administration in time range)  clopidogrel (PLAVIX) tablet 75 mg (has no administration in time range)  cyanocobalamin (VITAMIN B12) tablet 1,000 mcg (has no administration in time range)  cholecalciferol (VITAMIN D3) 25 MCG (1000 UNIT) tablet 2,000 Units (has no administration in time range)  sodium chloride flush (NS) 0.9 % injection 3 mL (has no administration in time range)  sodium chloride flush (NS) 0.9 % injection 3 mL (has no administration in time range)  0.9 %  sodium chloride infusion (has no administration in time range)   acetaminophen (TYLENOL) tablet 650 mg (has no administration in time range)  ondansetron (ZOFRAN) injection 4 mg (has no administration in time range)  losartan (COZAAR) tablet 50 mg (has no administration in time range)  insulin aspart (novoLOG) injection  0-15 Units (has no administration in time range)  insulin aspart (novoLOG) injection 4 Units (has no administration in time range)  dexamethasone (DECADRON) injection 6 mg (has no administration in time range)  remdesivir 200 mg in sodium chloride 0.9% 250 mL IVPB (200 mg Intravenous New Bag/Given 11/05/22 1642)    Followed by  remdesivir 100 mg in sodium chloride 0.9 % 100 mL IVPB (has no administration in time range)  insulin glargine-yfgn (SEMGLEE) injection 35 Units (has no administration in time range)  pantoprazole (PROTONIX) injection 40 mg (has no administration in time range)  ipratropium-albuterol (DUONEB) 0.5-2.5 (3) MG/3ML nebulizer solution 3 mL (has no administration in time range)  ceFEPIme (MAXIPIME) 2 g in sodium chloride 0.9 % 100 mL IVPB (0 g Intravenous Stopped 11/05/22 0959)  metroNIDAZOLE (FLAGYL) IVPB 500 mg (0 mg Intravenous Stopped 11/05/22 1107)  vancomycin (VANCOREADY) IVPB 2000 mg/400 mL (0 mg Intravenous Stopped 11/05/22 1329)  iohexol (OMNIPAQUE) 350 MG/ML injection 75 mL (75 mLs Intravenous Contrast Given 11/05/22 1050)  acetaminophen (TYLENOL) tablet 650 mg (650 mg Oral Given 11/05/22 1114)  heparin bolus via infusion 4,000 Units (4,000 Units Intravenous Bolus from Bag 11/05/22 1154)  furosemide (LASIX) injection 40 mg (40 mg Intravenous Given 11/05/22 1355)  dexamethasone (DECADRON) injection 10 mg (10 mg Intravenous Given 11/05/22 1356)    Mobility walks     Focused Assessments Pulmonary Assessment Handoff:  Lung sounds: L Breath Sounds: Clear, Fine crackles (in bases) R Breath Sounds: Diminished, Fine crackles (in bases) O2 Device: Nasal Cannula O2 Flow Rate (L/min): 2 L/min    R Recommendations: See  Admitting Provider Note  Report given to:   Additional Notes: On Heparin 10.5 and he is covid positive

## 2022-11-05 NOTE — Progress Notes (Signed)
ANTICOAGULATION CONSULT NOTE - Initial Consult  Pharmacy Consult for heparin  Indication: chest pain/ACS  Allergies  Allergen Reactions   Bupropion Nausea Only   Ozempic (0.25 Or 0.5 Mg-Dose) [Semaglutide(0.25 Or 0.'5mg'$ -Dos)] Nausea And Vomiting   Atorvastatin Other (See Comments)    Body aches Similar effect with rosuvastatin 40 mg twice weekly    Patient Measurements: Height: '5\' 6"'$  (167.6 cm) Weight: 114.3 kg (252 lb) IBW/kg (Calculated) : 63.8 Heparin Dosing Weight: 90.1  Vital Signs: Temp: 102.4 F (39.1 C) (03/09 0829) Temp Source: Rectal (03/09 0829) BP: 163/93 (03/09 0822) Pulse Rate: 111 (03/09 0822)  Labs: Recent Labs    11/05/22 0824 11/05/22 0839  HGB 11.1*  --   HCT 35.6*  --   PLT 202  --   APTT  --  32  LABPROT  --  14.9  INR  --  1.2  CREATININE 1.12  --   TROPONINIHS 1,268*  --     Estimated Creatinine Clearance: 70.8 mL/min (by C-G formula based on SCr of 1.12 mg/dL).   Medical History: Past Medical History:  Diagnosis Date   Back injury 02/2002   worker's comp   CHF (congestive heart failure) ([place])    Coronary artery disease, non-occlusive    a. cath 2002 with no sig CAD;  b. cath 2008 normal LM, LAD, LCx, p&dRCA 20-30%, PDA 30%; c.11/2015 NSTEMI/PCI: LM nl, LAD 95p (2.5x15 Xience DES), LCX nl, RCA 100p/m w/ L->R collats, EF 55-65% c. NSTEMI (02/2016) with no culprit leision, switched to Brilinta.  d. NSTEMI 03/2016: again, no culprit lesion and switched back to plavix 2/2 SOB with Brilnta.     Depression    Diabetes mellitus type 2, insulin dependent ([place])    Hyperlipemia    Hypertensive heart disease    Kidney stones    Morbid obesity ([place])    Osteoarthritis    Snoring     Assessment: Patient admitted for concern of sepsis, trop elevated to 1268. Differential includes demand ischemia vs. Myocarditis. Patient not on anticoag PTA, however on DAPT with ASA/Plavix. Cardiology to be consulted. Pharmacy consulted to dose heparin.   Goal of  Therapy:  Heparin level 0.3-0.7 units/ml Monitor platelets by anticoagulation protocol: Yes   Plan:  Give 4000 units bolus x 1 Start heparin infusion at 1050 units/hr Check anti-Xa level in 8 hours and daily while on heparin Continue to monitor H&H and platelets  [name] 11/05/2022,11:29 AM

## 2022-11-05 NOTE — Progress Notes (Signed)
ANTICOAGULATION CONSULT NOTE - follow-up  Pharmacy Consult for heparin  Indication: chest pain/ACS  Allergies  Allergen Reactions   Bupropion Nausea Only   Ozempic (0.25 Or 0.5 Mg-Dose) [Semaglutide(0.25 Or 0.'5mg'$ -Dos)] Nausea And Vomiting   Atorvastatin Other (See Comments)    Body aches Similar effect with rosuvastatin 40 mg twice weekly    Patient Measurements: Height: (P) '5\' 6"'$  (167.6 cm) Weight: (P) 114.8 kg (253 lb) (stated per pt) IBW/kg (Calculated) : (P) 63.8 Heparin Dosing Weight: 90.1  Vital Signs: Temp: 98.3 F (36.8 C) (03/09 1949) Temp Source: Oral (03/09 1949) BP: 172/81 (03/09 2010) Pulse Rate: 84 (03/09 2010)  Labs: Recent Labs    11/05/22 0824 11/05/22 0839 11/05/22 1055 11/05/22 2016  HGB 11.1*  --   --   --   HCT 35.6*  --   --   --   PLT 202  --   --   --   APTT  --  32  --   --   LABPROT  --  14.9  --   --   INR  --  1.2  --   --   HEPARINUNFRC  --   --   --  0.25*  CREATININE 1.12  --   --   --   TROPONINIHS 1,268*  --  1,880*  --      Estimated Creatinine Clearance: 70.8 mL/min (by C-G formula based on SCr of 1.12 mg/dL).   Medical History: Past Medical History:  Diagnosis Date   Back injury 02/2002   worker's comp   CHF (congestive heart failure) ([place])    Coronary artery disease, non-occlusive    a. cath 2002 with no sig CAD;  b. cath 2008 normal LM, LAD, LCx, p&dRCA 20-30%, PDA 30%; c.11/2015 NSTEMI/PCI: LM nl, LAD 95p (2.5x15 Xience DES), LCX nl, RCA 100p/m w/ L->R collats, EF 55-65% c. NSTEMI (02/2016) with no culprit leision, switched to Brilinta.  d. NSTEMI 03/2016: again, no culprit lesion and switched back to plavix 2/2 SOB with Brilnta.     Depression    Diabetes mellitus type 2, insulin dependent ([place])    Hyperlipemia    Hypertensive heart disease    Kidney stones    Morbid obesity ([place])    Osteoarthritis    Snoring     Assessment: Patient admitted for concern of sepsis, trop elevated to 1268. Differential includes  demand ischemia vs. Myocarditis. Patient not on anticoag PTA, however on DAPT with ASA/Plavix. Cardiology to be consulted. Pharmacy consulted to dose heparin.   3/9 PM update: HL 0.25- subtherapeutic No signs of bleeding or issues with gtt  Goal of Therapy:  Heparin level 0.3-0.7 units/ml Monitor platelets by anticoagulation protocol: Yes   Plan:  Give 1300 units bolus x 1 Start heparin infusion at 1200 units/hr Check anti-Xa level w/ AM labs (3/10) and daily while on heparin Continue to monitor H&H and platelets  Bridgit Eynon BS, PharmD, BCPS Clinical Pharmacist 11/05/2022 9:04 PM  Contact: [phone] after 3 PM  "Be curious, not judgmental..." -Jamal Maes

## 2022-11-05 NOTE — Progress Notes (Signed)
Arrived from ED A&Ox3.  Daughter at bedside.   CCMD notified.  Vitals signs obtained.  Dinner at bedside

## 2022-11-05 NOTE — ED Provider Notes (Signed)
[facility] Provider Note   CSN: [id number] Arrival date & time: 11/05/22  [id number]     History  No chief complaint on file.   [name] is a 72 y.o. male.  The history is provided by the patient and medical records. No language interpreter was used.  Shortness of Breath Severity:  Severe Onset quality:  Gradual Duration:  4 days Timing:  Constant Progression:  Worsening Chronicity:  New Context: URI   Relieved by:  Nothing Worsened by:  Nothing Ineffective treatments:  None tried Associated symptoms: chest pain, cough, diaphoresis, fever, sputum production and vomiting   Associated symptoms: no abdominal pain, no headaches, no hemoptysis, no rash, no swollen glands and no wheezing   Risk factors: obesity   Risk factors: no hx of PE/DVT        Home Medications Prior to Admission medications   Medication Sig Start Date End Date Taking? Authorizing Provider  aspirin 81 MG EC tablet Take 1 tablet (81 mg total) by mouth daily. 01/12/16   Theora Gianotti, NP  carvedilol (COREG) 6.25 MG tablet TAKE 1 TABLET BY MOUTH ONCE DAILY 10/03/22   Minna Merritts, MD  cholecalciferol (VITAMIN D3) 25 MCG (1000 UNIT) tablet Take 2,000 Units by mouth daily.    [provider]  clopidogrel (PLAVIX) 75 MG tablet TAKE 1 TABLET BY MOUTH ONCE DAILY WITH BREAKFAST 01/03/22   Bedsole, Amy E, MD  Continuous Blood Gluc Receiver (FREESTYLE LIBRE 2 READER) DEVI USE WITH SENSORS TO MONITOR SUGAR CONTINUOUSLY 06/30/22   Bedsole, Amy E, MD  Continuous Blood Gluc Sensor (FREESTYLE LIBRE 2 SENSOR) MISC APPLY SENSOR EVERY 14 DAYS TO MONITOR SUGAR CONTINOUSLY 11/03/22   Bedsole, Amy E, MD  ezetimibe (ZETIA) 10 MG tablet TAKE 1 TABLET BY MOUTH ONCE DAILY 10/12/22   Minna Merritts, MD  furosemide (LASIX) 40 MG tablet TAKE 1 TABLET BY MOUTH ONCE A DAY  CAN TAKE A 2ND DAILY DOSE AS NEEDED. 09/30/22   Bedsole, Amy E, MD  HYDROcodone-acetaminophen (NORCO)  5-325 MG tablet Take 1 tablet by mouth 2 (two) times daily as needed for moderate pain. 08/18/22   Bedsole, Amy E, MD  HYDROcodone-acetaminophen (NORCO/VICODIN) 5-325 MG tablet TAKE 1 TABLET BY MOUTH TWICE (2) DAILY AS NEEDED FOR MODERATE PAIN 08/18/22   Bedsole, Amy E, MD  HYDROcodone-acetaminophen (NORCO/VICODIN) 5-325 MG tablet TAKE 1 TABLET BY MOUTH TWICE A DAY AS NEEDED FOR MODERATE PAIN 08/18/22   Bedsole, Amy E, MD  hydrOXYzine (ATARAX) 10 MG tablet TAKE 1 TABLET BY MOUTH 3 TIMES DAILY AS NEEDED FOR ANXIETY 10/12/22   Bedsole, Amy E, MD  insulin aspart (NOVOLOG FLEXPEN) 100 UNIT/ML FlexPen 13 units in AM (scheduled) and 3 units PRN in evening Patient taking differently: Inject into the skin. 3-5 units in PM 04/03/22   Bedsole, Amy E, MD  isosorbide mononitrate (IMDUR) 30 MG 24 hr tablet TAKE 1 TABLET BY MOUTH TWICE DAILY 09/21/22   Minna Merritts, MD  losartan (COZAAR) 50 MG tablet TAKE 1 TABLET BY MOUTH ONCE DAILY 10/03/22   Bedsole, Amy E, MD  nitroGLYCERIN (NITROSTAT) 0.4 MG SL tablet DISSOLVE 1 TABLET UNDER TONGUE AS NEEDEDFOR CHEST PAIN. MAY REPEAT 5 MINUTES APART 3 TIMES IF NEEDED 02/25/21   Minna Merritts, MD  Semaglutide, 2 MG/DOSE, 8 MG/3ML SOPN Inject 2 mg as directed once a week. 10/18/22   Bedsole, Amy E, MD  TRESIBA FLEXTOUCH 100 UNIT/ML FlexTouch Pen INJECT 50 UNITS  INTO THE SKIN DAILY 11/03/22   Bedsole, Amy E, MD  triamcinolone cream (KENALOG) 0.5 % Apply 1 Application topically 2 (two) times daily. 05/12/22   Bedsole, Amy E, MD  TRUEPLUS 5-BEVEL PEN NEEDLES 31G X 6 MM MISC USE TO INJECT INSULIN 3 TIMES A DAY 02/15/22   Bedsole, Amy E, MD  venlafaxine XR (EFFEXOR-XR) 150 MG 24 hr capsule TAKE 1 CAPSULE BY MOUTH DAILY WITH BREAKFAST. TAKE WITH EFFEXOR XR '75MG'$  FORA TOTAL OF '225MG'$  10/03/22   Bedsole, Amy E, MD  venlafaxine XR (EFFEXOR-XR) 75 MG 24 hr capsule TAKE 1 CAPSULE BY MOUTH ONCE DAILY. TAKEIN ADDITION TO THE 150 MG CAPSULE FOR A TOTAL DOSE OF '225MG'$  DAILY 10/03/22   Bedsole, Amy E, MD   vitamin B-12 (CYANOCOBALAMIN) 1000 MCG tablet Take 1,000 mcg by mouth daily.    [provider]      Allergies    Bupropion and Atorvastatin    Review of Systems   Review of Systems  Constitutional:  Positive for chills, diaphoresis, fatigue and fever.  HENT:  Positive for congestion.   Respiratory:  Positive for cough, sputum production, chest tightness and shortness of breath. Negative for hemoptysis, wheezing and stridor.   Cardiovascular:  Positive for chest pain and leg swelling (chronic per pt). Negative for palpitations.  Gastrointestinal:  Positive for nausea and vomiting. Negative for abdominal pain, constipation and diarrhea.  Genitourinary:  Negative for frequency.  Musculoskeletal:  Negative for back pain.  Skin:  Negative for rash.  Neurological:  Negative for headaches.  Psychiatric/Behavioral:  Negative for agitation and confusion.   All other systems reviewed and are negative.   Physical Exam Updated Vital Signs BP (!) 163/93   Pulse (!) 111   Temp (!) 102.4 F (39.1 C) (Rectal)   Resp (!) 22   Ht '5\' 6"'$  (1.676 m)   Wt 114.3 kg   SpO2 94%   BMI 40.67 kg/m  Physical Exam Vitals and nursing note reviewed.  Constitutional:      General: He is not in acute distress.    Appearance: He is well-developed. He is ill-appearing. He is not toxic-appearing or diaphoretic.  HENT:     Head: Normocephalic and atraumatic.  Eyes:     Conjunctiva/sclera: Conjunctivae normal.     Pupils: Pupils are equal, round, and reactive to light.  Cardiovascular:     Rate and Rhythm: Regular rhythm. Tachycardia present.     Heart sounds: No murmur heard. Pulmonary:     Effort: Pulmonary effort is normal. Tachypnea present. No respiratory distress.     Breath sounds: No stridor. Rhonchi and rales present. No wheezing.  Abdominal:     Palpations: Abdomen is soft.     Tenderness: There is no abdominal tenderness.  Musculoskeletal:        General: No swelling.      Cervical back: Neck supple.     Right lower leg: No tenderness. Edema present.     Left lower leg: No tenderness. Edema present.  Skin:    General: Skin is warm and dry.     Capillary Refill: Capillary refill takes less than 2 seconds.     Findings: No erythema.  Neurological:     General: No focal deficit present.     Mental Status: He is alert.  Psychiatric:        Mood and Affect: Mood normal.     ED Results / Procedures / Treatments   Labs (all labs ordered are listed, but only  abnormal results are displayed) Labs Reviewed  RESP PANEL BY RT-PCR (RSV, FLU A&B, COVID)  RVPGX2 - Abnormal; Notable for the following components:      Result Value   SARS Coronavirus 2 by RT PCR POSITIVE (*)    All other components within normal limits  CBC WITH DIFFERENTIAL/PLATELET - Abnormal; Notable for the following components:   Hemoglobin 11.1 (*)    HCT 35.6 (*)    RDW 17.8 (*)    Lymphs Abs 0.3 (*)    All other components within normal limits  COMPREHENSIVE METABOLIC PANEL - Abnormal; Notable for the following components:   CO2 20 (*)    Glucose, Bld 299 (*)    Calcium 8.5 (*)    All other components within normal limits  BRAIN NATRIURETIC PEPTIDE - Abnormal; Notable for the following components:   B Natriuretic Peptide 1,240.5 (*)    All other components within normal limits  D-DIMER, QUANTITATIVE - Abnormal; Notable for the following components:   D-Dimer, Quant 1.56 (*)    All other components within normal limits  LACTIC ACID, PLASMA - Abnormal; Notable for the following components:   Lactic Acid, Venous 2.5 (*)    All other components within normal limits  URINALYSIS, ROUTINE W REFLEX MICROSCOPIC - Abnormal; Notable for the following components:   Glucose, UA >=500 (*)    All other components within normal limits  TROPONIN I (HIGH SENSITIVITY) - Abnormal; Notable for the following components:   Troponin I (High Sensitivity) 1,268 (*)    All other components within normal  limits  TROPONIN I (HIGH SENSITIVITY) - Abnormal; Notable for the following components:   Troponin I (High Sensitivity) 1,880 (*)    All other components within normal limits  CULTURE, BLOOD (ROUTINE X 2)  CULTURE, BLOOD (ROUTINE X 2)  URINE CULTURE  LIPASE, BLOOD  LACTIC ACID, PLASMA  MAGNESIUM  TSH  PROTIME-INR  APTT  HEPARIN LEVEL (UNFRACTIONATED)    EKG EKG Interpretation  Date/Time:  Saturday November 05 2022 08:20:16 EST Ventricular Rate:  116 PR Interval:  223 QRS Duration: 92 QT Interval:  294 QTC Calculation: 409 R Axis:   1 Text Interpretation: Sinus tachycardia with irregular rate Borderline prolonged PR interval Anterior infarct, old Nonspecific repol abnormality, lateral leads when compared to prior, similar prolonged PR and appears sinus tachycardia. No STEMI Confirmed by Antony Blackbird [phone]) on 11/05/2022 8:30:33 AM  Radiology CT Angio Chest PE W and/or Wo Contrast  Result Date: 11/05/2022 CLINICAL DATA:  Positive D-dimer, chest pain, short of breath, hypoxia, possible sepsis EXAM: CT ANGIOGRAPHY CHEST WITH CONTRAST TECHNIQUE: Multidetector CT imaging of the chest was performed using the standard protocol during bolus administration of intravenous contrast. Multiplanar CT image reconstructions and MIPs were obtained to evaluate the vascular anatomy. RADIATION DOSE REDUCTION: This exam was performed according to the departmental dose-optimization program which includes automated exposure control, adjustment of the mA and/or kV according to patient size and/or use of iterative reconstruction technique. CONTRAST:  46m OMNIPAQUE IOHEXOL 350 MG/ML SOLN COMPARISON:  09/01/2009, 11/05/2022 FINDINGS: Cardiovascular: This is a technically adequate evaluation of the pulmonary vasculature. No filling defects or pulmonary emboli. Mild cardiomegaly with prominent left ventricular dilatation. Normal caliber of the thoracic aorta. Atherosclerosis of the aorta and coronary vasculature.  Mediastinum/Nodes: Mediastinal adenopathy measuring up to 16 mm in the subcarinal station, nonspecific. No pathologic hilar or axillary lymph nodes. Thyroid, trachea, and esophagus are grossly unremarkable. Lungs/Pleura: There are small bilateral pleural effusions, volume estimated less than 500  cc each. Bilateral perihilar ground-glass airspace disease most compatible with edema. No pneumothorax. The central airways are patent. Upper Abdomen: No acute abnormality. Musculoskeletal: No acute or destructive bony lesions. Reconstructed images demonstrate no additional findings. Review of the MIP images confirms the above findings. IMPRESSION: 1. No evidence of pulmonary embolus. 2. Cardiomegaly, bilateral perihilar ground-glass airspace disease, and bilateral pleural effusions most consistent with congestive heart failure. 3. Nonspecific mediastinal adenopathy, which may be reactive. 4. Aortic Atherosclerosis (ICD10-I70.0). Coronary artery atherosclerosis. Electronically Signed   By: Randa Ngo M.D.   On: 11/05/2022 10:56   DG Chest Portable 1 View  Result Date: 11/05/2022 CLINICAL DATA:  72 year old male with chest pain, shortness of breath, hypoxia, possible sepsis. EXAM: PORTABLE CHEST 1 VIEW COMPARISON:  Chest radiographs 09/15/2021 and earlier. FINDINGS: Portable AP upright view at 0842 hours. Stable cardiomegaly and mediastinal contours. No pneumothorax. No consolidation. Symmetric increased bilateral pulmonary interstitial opacity and veiling opacity at the right lateral costophrenic angle. No definite left pleural effusion. No acute osseous abnormality identified. Visualized tracheal air column is within normal limits. Negative visible bowel gas. IMPRESSION: Chronic cardiomegaly with symmetric increased interstitial opacity most likely pulmonary edema, with probable small right pleural effusion. Viral/atypical respiratory infection felt less likely. Electronically Signed   By: Genevie Ann M.D.   On:  11/05/2022 08:54    Procedures Procedures    CRITICAL CARE Performed by: Gwenyth Allegra Ellis Mehaffey Total critical care time: 40 minutes Critical care time was exclusive of separately billable procedures and treating other patients. Critical care was necessary to treat or prevent imminent or life-threatening deterioration. Critical care was time spent personally by me on the following activities: development of treatment plan with patient and/or surrogate as well as nursing, discussions with consultants, evaluation of patient's response to treatment, examination of patient, obtaining history from patient or surrogate, ordering and performing treatments and interventions, ordering and review of laboratory studies, ordering and review of radiographic studies, pulse oximetry and re-evaluation of patient's condition.   Medications Ordered in ED Medications  lactated ringers infusion ( Intravenous New Bag/Given 11/05/22 0911)  vancomycin (VANCOREADY) IVPB 2000 mg/400 mL (2,000 mg Intravenous New Bag/Given 11/05/22 1117)  ceFEPIme (MAXIPIME) 2 g in sodium chloride 0.9 % 100 mL IVPB (has no administration in time range)  vancomycin (VANCOREADY) IVPB 750 mg/150 mL (has no administration in time range)  heparin ADULT infusion 100 units/mL (25000 units/238m) (1,050 Units/hr Intravenous New Bag/Given 11/05/22 1153)  furosemide (LASIX) injection 40 mg (has no administration in time range)  dexamethasone (DECADRON) injection 10 mg (has no administration in time range)  ceFEPIme (MAXIPIME) 2 g in sodium chloride 0.9 % 100 mL IVPB (0 g Intravenous Stopped 11/05/22 0959)  metroNIDAZOLE (FLAGYL) IVPB 500 mg (0 mg Intravenous Stopped 11/05/22 1107)  iohexol (OMNIPAQUE) 350 MG/ML injection 75 mL (75 mLs Intravenous Contrast Given 11/05/22 1050)  acetaminophen (TYLENOL) tablet 650 mg (650 mg Oral Given 11/05/22 1114)  heparin bolus via infusion 4,000 Units (4,000 Units Intravenous Bolus from Bag 11/05/22 1154)    ED Course/  Medical Decision Making/ A&P                             Medical Decision Making Amount and/or Complexity of Data Reviewed Labs: ordered. Radiology: ordered. ECG/medicine tests: ordered.  Risk OTC drugs. Prescription drug management. Decision regarding hospitalization.    William QUATTROis a 72y.o. male with past medical history significant for CAD with stenting,  CHF on diuretics, diabetes, hypertension, hyperlipidemia, obesity, previous anemia, and depression who presents with 4 days of worsening symptoms including nausea and vomiting, subjective fevers, chills, productive cough, orthopnea, shortness of breath, chest discomfort, and fatigue.  According to patient, he had his first Ozempic shot on Wednesday and since then has had nausea and vomiting.  He reports he has had chills and some sweating on and off but then developed chest tightness and shortness of breath.  This has been progressive and today with EMS was found to have oxygen saturation of 84% with walking.  On my first evaluation oxygen saturation was 85% while sitting at rest on room air.  He was placed on oxygen quickly.  Patient said he has had some nausea and vomiting that has slightly improved now.  He reports he has not any abdominal pain, constipation, diarrhea, or urinary changes.  Reports his legs are always edematous and this is not different than normal.  He has had cough with the shortness of breath.  Denies trauma.  On exam, patient has rhonchi and rales in lung fields.  Legs are edematous.  Chest and abdomen nontender.  I do not appreciate a murmur initially.  Back nontender on my exam.  Patient is ill-appearing.  On arrival, patient was warm to the touch therefore a rectal temp was obtained and was found to 102.4.  Patient is tachycardic, tachypneic, hypoxic, and febrile with productive cough so we will make a code sepsis and start him on antibiotics.  Will continue his workup to rule out heart failure exacerbation,  viral illness, or even pulmonary embolism given his vital signs and the pleuritic discomfort he is experiencing.  Patient reports he typically takes baby aspirin but did not take it today.  Will await workup results but due to the new oxygen requirement dissipate admission.  Due to my concern for acute heart failure exacerbation with this infection, will hold on initial fluid resuscitation as the patient is not hypotensive and I am concerned that with his inability to lay flat being new with orthopnea, worsened shortness of breath, and rales on exam I do not want to follow him with fluids.  Will hold on fluids initially.  11:04 AM Workup continues to return.  Patient is found to be COVID-positive.  D-dimer was elevated so a CT PE study was ordered and does not show evidence of acute pulmonary embolism.  It does show pleural effusions and edema consistent with heart failure.  BNP is elevated at 1240 and troponin elevated at 1268.  Both of these elevations appear to be new.  Lipase not elevated.  Metabolic panel showed some hyperglycemia but otherwise liver function and electrolytes relatively normal.  Urinalysis did not show UTI.  Due to the elevated troponin and BNP I suspect this may be myocarditis versus demand ischemia in the setting of previous heart failure and COVID infection.  Will discuss with cardiology for recommendations but will call for admission.  Will still hold on fluids despite being a code sepsis.  CT PE study does not show pulm embolism but shows evidence of heart failure and opacities.  Given his COVID and findings I called cardiology.  Cardiology reviewed case and due to the elevated troponin recommend starting on heparin but they agree with admission to medicine for likely demand ischemia in the setting of COVID.  Medicine requested we give Decadron for COVID and Lasix.  They will see for admission.  Cardiology will follow as well and heparin was ordered per  their  recommendation.        Final Clinical Impression(s) / ED Diagnoses Final diagnoses:  COVID-19  Hypoxia  Elevated troponin     Clinical Impression: 1. COVID-19   2. Hypoxia   3. Elevated troponin     Disposition: Admit  This note was prepared with assistance of Dragon voice recognition software. Occasional wrong-word or sound-a-like substitutions may have occurred due to the inherent limitations of voice recognition software.     Kwali Wrinkle, Gwenyth Allegra, MD 11/05/22 [phone]

## 2022-11-05 NOTE — Consult Note (Signed)
Cardiology Consultation:  Patient ID: [name] MRN: [id number]; DOB: 1951/08/14  Admit date: 11/05/2022 Date of Consult: 11/05/2022  Primary Care Provider: Jinny Sanders, MD Primary Cardiologist: None  Primary Electrophysiologist:  None   Patient Profile:  [name] is a 72 y.o. male with a hx of diastolic heart failure, hypertension, CAD (PCI to LAD in the past, CTO RCA), diabetes, obesity who is being seen today for the evaluation of non-STEMI at the request of William Morrison, MD.  History of Present Illness:  [name] reports he is felt poorly since starting Ozempic on Wednesday.  He reports 3 to 4 days of nausea and vomiting.  He describes tightness in his chest but this is associated with vomiting.  He reports that he has had some lower extremity edema but nothing worrisome.  He reports no fevers or chills.  He reports just feeling very poor and then came to the emergency room.  On arrival to the emergency room vitals are stable.  BP elevated at 164/88.  Labs notable for serum creatinine of 1.12.  He does have elevated glucose at 299.  BNP 1240.  Initial troponin 1268 and trending up.  Lactic acid 2.5 and trending down.  WBC 6.8.  Hemoglobin values are stable 11.1.  D-dimer was elevated.  TSH is normal.  CT PE study negative.  Notable pleural effusions on lung imaging.  He reports no chest discomfort at time examination.  He tells me his breathing is unlabored.  He does appear slightly volume up.  Heart Pathway Score:       Past Medical History: Past Medical History:  Diagnosis Date   Back injury 02/2002   worker's comp   CHF (congestive heart failure) ([place])    Coronary artery disease, non-occlusive    a. cath 2002 with no sig CAD;  b. cath 2008 normal LM, LAD, LCx, p&dRCA 20-30%, PDA 30%; c.11/2015 NSTEMI/PCI: LM nl, LAD 95p (2.5x15 Xience DES), LCX nl, RCA 100p/m w/ L->R collats, EF 55-65% c. NSTEMI (02/2016) with no culprit leision, switched to Brilinta.  d. NSTEMI 03/2016:  again, no culprit lesion and switched back to plavix 2/2 SOB with Brilnta.     Depression    Diabetes mellitus type 2, insulin dependent ([place])    Hyperlipemia    Hypertensive heart disease    Kidney stones    Morbid obesity ([place])    Osteoarthritis    Snoring     Past Surgical History: Past Surgical History:  Procedure Laterality Date   CARDIAC CATHETERIZATION  09/29/2000   diffuse LAD 30% LCA  EF 50-60%   CARDIAC CATHETERIZATION  06/30/2007   no significant CAD   CARDIAC CATHETERIZATION N/A 12/25/2015   Procedure: Left Heart Cath and Coronary Angiography;  Surgeon: Minna Merritts, MD;  Location: [facility];  Service: Cardiovascular;  Laterality: N/A;   CARDIAC CATHETERIZATION N/A 12/25/2015   Procedure: Coronary Stent Intervention;  Surgeon: Yolonda Kida, MD;  Location: [facility];  Service: Cardiovascular;  Laterality: N/A;   CARDIAC CATHETERIZATION N/A 03/10/2016   Procedure: Left Heart Cath and Coronary Angiography;  Surgeon: Wellington Hampshire, MD;  Location: [facility];  Service: Cardiovascular;  Laterality: N/A;   CARDIAC CATHETERIZATION N/A 04/06/2016   Procedure: Left Heart Cath and Coronary Angiography;  Surgeon: Belva Crome, MD;  Location: [facility];  Service: Cardiovascular;  Laterality: N/A;   CIRCUMCISION     CORONARY ARTERY BYPASS GRAFT  Home Medications:  Prior to Admission medications   Medication Sig Start Date End Date Taking? Authorizing Provider  aspirin 81 MG EC tablet Take 1 tablet (81 mg total) by mouth daily. 01/12/16   Theora Gianotti, NP  carvedilol (COREG) 6.25 MG tablet TAKE 1 TABLET BY MOUTH ONCE DAILY 10/03/22   Minna Merritts, MD  cholecalciferol (VITAMIN D3) 25 MCG (1000 UNIT) tablet Take 2,000 Units by mouth daily.    [provider]  clopidogrel (PLAVIX) 75 MG tablet TAKE 1 TABLET BY MOUTH ONCE DAILY WITH BREAKFAST 01/03/22   Bedsole, Amy E, MD  Continuous Blood Gluc Receiver  (FREESTYLE LIBRE 2 READER) DEVI USE WITH SENSORS TO MONITOR SUGAR CONTINUOUSLY 06/30/22   Bedsole, Amy E, MD  Continuous Blood Gluc Sensor (FREESTYLE LIBRE 2 SENSOR) MISC APPLY SENSOR EVERY 14 DAYS TO MONITOR SUGAR CONTINOUSLY 11/03/22   Bedsole, Amy E, MD  ezetimibe (ZETIA) 10 MG tablet TAKE 1 TABLET BY MOUTH ONCE DAILY 10/12/22   Minna Merritts, MD  furosemide (LASIX) 40 MG tablet TAKE 1 TABLET BY MOUTH ONCE A DAY  CAN TAKE A 2ND DAILY DOSE AS NEEDED. 09/30/22   Bedsole, Amy E, MD  HYDROcodone-acetaminophen (NORCO) 5-325 MG tablet Take 1 tablet by mouth 2 (two) times daily as needed for moderate pain. 08/18/22   Bedsole, Amy E, MD  HYDROcodone-acetaminophen (NORCO/VICODIN) 5-325 MG tablet TAKE 1 TABLET BY MOUTH TWICE (2) DAILY AS NEEDED FOR MODERATE PAIN 08/18/22   Bedsole, Amy E, MD  HYDROcodone-acetaminophen (NORCO/VICODIN) 5-325 MG tablet TAKE 1 TABLET BY MOUTH TWICE A DAY AS NEEDED FOR MODERATE PAIN 08/18/22   Bedsole, Amy E, MD  hydrOXYzine (ATARAX) 10 MG tablet TAKE 1 TABLET BY MOUTH 3 TIMES DAILY AS NEEDED FOR ANXIETY 10/12/22   Bedsole, Amy E, MD  insulin aspart (NOVOLOG FLEXPEN) 100 UNIT/ML FlexPen 13 units in AM (scheduled) and 3 units PRN in evening Patient taking differently: Inject into the skin. 3-5 units in PM 04/03/22   Bedsole, Amy E, MD  isosorbide mononitrate (IMDUR) 30 MG 24 hr tablet TAKE 1 TABLET BY MOUTH TWICE DAILY 09/21/22   Minna Merritts, MD  losartan (COZAAR) 50 MG tablet TAKE 1 TABLET BY MOUTH ONCE DAILY 10/03/22   Bedsole, Amy E, MD  nitroGLYCERIN (NITROSTAT) 0.4 MG SL tablet DISSOLVE 1 TABLET UNDER TONGUE AS NEEDEDFOR CHEST PAIN. MAY REPEAT 5 MINUTES APART 3 TIMES IF NEEDED 02/25/21   Minna Merritts, MD  Semaglutide, 2 MG/DOSE, 8 MG/3ML SOPN Inject 2 mg as directed once a week. 10/18/22   Bedsole, Amy E, MD  TRESIBA FLEXTOUCH 100 UNIT/ML FlexTouch Pen INJECT 50 UNITS INTO THE SKIN DAILY 11/03/22   Bedsole, Amy E, MD  triamcinolone cream (KENALOG) 0.5 % Apply 1 Application  topically 2 (two) times daily. 05/12/22   Bedsole, Amy E, MD  TRUEPLUS 5-BEVEL PEN NEEDLES 31G X 6 MM MISC USE TO INJECT INSULIN 3 TIMES A DAY 02/15/22   Bedsole, Amy E, MD  venlafaxine XR (EFFEXOR-XR) 150 MG 24 hr capsule TAKE 1 CAPSULE BY MOUTH DAILY WITH BREAKFAST. TAKE WITH EFFEXOR XR '75MG'$  FORA TOTAL OF '225MG'$  10/03/22   Bedsole, Amy E, MD  venlafaxine XR (EFFEXOR-XR) 75 MG 24 hr capsule TAKE 1 CAPSULE BY MOUTH ONCE DAILY. TAKEIN ADDITION TO THE 150 MG CAPSULE FOR A TOTAL DOSE OF '225MG'$  DAILY 10/03/22   Bedsole, Amy E, MD  vitamin B-12 (CYANOCOBALAMIN) 1000 MCG tablet Take 1,000 mcg by mouth daily.    [provider]  Inpatient Medications: Scheduled Meds:  dexamethasone (DECADRON) injection  10 mg Intravenous Once   furosemide  40 mg Intravenous Once   Continuous Infusions:  ceFEPime (MAXIPIME) IV     heparin 1,050 Units/hr (11/05/22 1153)   lactated ringers 150 mL/hr at 11/05/22 0911   vancomycin     PRN Meds:   Allergies:    Allergies  Allergen Reactions   Bupropion Nausea Only   Ozempic (0.25 Or 0.5 Mg-Dose) [Semaglutide(0.25 Or 0.'5mg'$ -Dos)] Nausea And Vomiting   Atorvastatin Other (See Comments)    Body aches Similar effect with rosuvastatin 40 mg twice weekly    Social History:   Social History   Socioeconomic History   Marital status: Widowed    Spouse name: Not on file   Number of children: Not on file   Years of education: Not on file   Highest education level: Not on file  Occupational History   Occupation: disabled    Employer: UNEMPLOYED    Comment: back injury  Tobacco Use   Smoking status: Never   Smokeless tobacco: Never  Vaping Use   Vaping Use: Never used  Substance and Sexual Activity   Alcohol use: No    Alcohol/week: 0.0 standard drinks of alcohol   Drug use: No   Sexual activity: Not Currently  Other Topics Concern   Not on file  Social History Narrative   Financial concerns, wife with depression.   Minimal exercise.   Diet: poor.    Social Determinants of Health   Financial Resource Strain: Low Risk  (11/11/2021)   Overall Financial Resource Strain (CARDIA)    Difficulty of Paying Living Expenses: Not very hard  Food Insecurity: No Food Insecurity (10/24/2022)   Hunger Vital Sign    Worried About Running Out of Food in the Last Year: Never true    Ran Out of Food in the Last Year: Never true  Transportation Needs: No Transportation Needs (10/24/2022)   PRAPARE - Hydrologist (Medical): No    Lack of Transportation (Non-Medical): No  Physical Activity: Inactive (11/11/2021)   Exercise Vital Sign    Days of Exercise per Week: 0 days    Minutes of Exercise per Session: 0 min  Stress: No Stress Concern Present (11/11/2021)   [place]    Feeling of Stress : Only a little  Social Connections: Moderately Isolated (11/11/2021)   Social Connection and Isolation Panel [NHANES]    Frequency of Communication with Friends and Family: More than three times a week    Frequency of Social Gatherings with Friends and Family: More than three times a week    Attends Religious Services: More than 4 times per year    Active Member of [employer] or Organizations: No    Attends Archivist Meetings: Never    Marital Status: Widowed  Intimate Partner Violence: Not At Risk (11/11/2021)   Humiliation, Afraid, Rape, and Kick questionnaire    Fear of Current or Ex-Partner: No    Emotionally Abused: No    Physically Abused: No    Sexually Abused: No     Family History:    Family History  Problem Relation Age of Onset   Alzheimer's disease Mother    Emphysema Mother    Diabetes Father    Heart disease Father        MI   Cancer Brother        ? Neck cancer  ROS:  All other ROS reviewed and negative. Pertinent positives noted in the HPI.     Physical Exam/Data:   Vitals:   11/05/22 0830 11/05/22 0831 11/05/22 1130 11/05/22  1220  BP:   (!) 164/88   Pulse:   94   Resp:   20   Temp:    98.6 F (37 C)  TempSrc:    Oral  SpO2: 94%  99%   Weight:  114.3 kg    Height:  '5\' 6"'$  (1.676 m)     No intake or output data in the 24 hours ending 11/05/22 1319     11/05/2022    8:31 AM 08/18/2022    3:39 PM 06/15/2022    2:37 PM  Last 3 Weights  Weight (lbs) 252 lb 259 lb 8 oz 258 lb  Weight (kg) 114.306 kg 117.708 kg 117.028 kg    Body mass index is 40.67 kg/m.  General: Well nourished, well developed, in no acute distress Head: Atraumatic, normal size  Eyes: PEERLA, EOMI  Neck: Supple, no JVD Endocrine: No thryomegaly Cardiac: Normal S1, S2; RRR; no murmurs, rubs, or gallops Lungs: Diminished sounds bilaterally Abd: Soft, nontender, no hepatomegaly  Ext: 1+ edema Musculoskeletal: No deformities, BUE and BLE strength normal and equal Skin: Warm and dry, no rashes   Neuro: Alert and oriented to person, place, time, and situation, CNII-XII grossly intact, no focal deficits  Psych: Normal mood and affect   EKG:  The EKG was personally reviewed and demonstrates: Sinus tachycardia with PACs, heart rate 116, old anterior infarct, inferior infarct Telemetry:  Telemetry was personally reviewed and demonstrates: Sinus rhythm with PACs and PVCs  Relevant CV Studies: LHC 04/06/2016 Mid RCA to Dist RCA lesion, 100 %stenosed. Mid LAD-2 lesion, 60 %stenosed. Mid LAD-1 lesion, 20 %stenosed. The left ventricular systolic function is normal. The left ventricular ejection fraction is 50-55% by visual estimate. LV end diastolic pressure is normal. 2nd [name] lesion, 75 %stenosed.   Chronic total occlusion of the right coronary in the proximal segment well collateralized from the left circumflex and LAD. Widely patent proximal to mid LAD stent previously placed in July. There is first diagonal diagonal and LAD 30 and 50% narrowing respectively. Widely patent circumflex unchanged from previous with 70% narrowing in a small branch  of the first marginal. Circumflex collateralizes the distal right coronary left ventricular branch. Inferobasal hypokinesis. EF 50%. EDP is normal. Unable to identify a significant change in the angiographic appearance since prior study in July.  TTE 04/01/2020  1. Left ventricular ejection fraction, by estimation, is 60 to 65%. Left  ventricular ejection fraction by PLAX is 61 %. The left ventricle has  normal function. The left ventricle has no regional wall motion  abnormalities. There is mild left ventricular  hypertrophy. Left ventricular diastolic parameters are consistent with  Grade I diastolic dysfunction (impaired relaxation).   2. Right ventricular systolic function is normal. The right ventricular  size is normal. There is normal pulmonary artery systolic pressure.   3. Left atrial size was mildly dilated.   4. Right atrial size was mildly dilated.   5. The mitral valve was not well visualized. Trivial mitral valve  regurgitation.   6. The aortic valve was not well visualized. Aortic valve regurgitation  is not visualized.    Laboratory Data: High Sensitivity Troponin:   Recent Labs  Lab 11/05/22 0824 11/05/22 1055  TROPONINIHS 1,268* 1,880*     Cardiac EnzymesNo results for input(s): "TROPONINI" in the  last 168 hours. No results for input(s): "TROPIPOC" in the last 168 hours.  Chemistry Recent Labs  Lab 11/05/22 0824  NA 137  K 3.8  CL 103  CO2 20*  GLUCOSE 299*  BUN 20  CREATININE 1.12  CALCIUM 8.5*  GFRNONAA >60  ANIONGAP 14    Recent Labs  Lab 11/05/22 0824  PROT 7.2  ALBUMIN 3.5  AST 31  ALT 16  ALKPHOS 73  BILITOT 0.8   Hematology Recent Labs  Lab 11/05/22 0824  WBC 6.8  RBC 4.26  HGB 11.1*  HCT 35.6*  MCV 83.6  MCH 26.1  MCHC 31.2  RDW 17.8*  PLT 202   BNP Recent Labs  Lab 11/05/22 0824  BNP 1,240.5*    DDimer  Recent Labs  Lab 11/05/22 0824  DDIMER 1.56*    Radiology/Studies:  CT Angio Chest PE W and/or Wo  Contrast  Result Date: 11/05/2022 CLINICAL DATA:  Positive D-dimer, chest pain, short of breath, hypoxia, possible sepsis EXAM: CT ANGIOGRAPHY CHEST WITH CONTRAST TECHNIQUE: Multidetector CT imaging of the chest was performed using the standard protocol during bolus administration of intravenous contrast. Multiplanar CT image reconstructions and MIPs were obtained to evaluate the vascular anatomy. RADIATION DOSE REDUCTION: This exam was performed according to the departmental dose-optimization program which includes automated exposure control, adjustment of the mA and/or kV according to patient size and/or use of iterative reconstruction technique. CONTRAST:  56m OMNIPAQUE IOHEXOL 350 MG/ML SOLN COMPARISON:  09/01/2009, 11/05/2022 FINDINGS: Cardiovascular: This is a technically adequate evaluation of the pulmonary vasculature. No filling defects or pulmonary emboli. Mild cardiomegaly with prominent left ventricular dilatation. Normal caliber of the thoracic aorta. Atherosclerosis of the aorta and coronary vasculature. Mediastinum/Nodes: Mediastinal adenopathy measuring up to 16 mm in the subcarinal station, nonspecific. No pathologic hilar or axillary lymph nodes. Thyroid, trachea, and esophagus are grossly unremarkable. Lungs/Pleura: There are small bilateral pleural effusions, volume estimated less than 500 cc each. Bilateral perihilar ground-glass airspace disease most compatible with edema. No pneumothorax. The central airways are patent. Upper Abdomen: No acute abnormality. Musculoskeletal: No acute or destructive bony lesions. Reconstructed images demonstrate no additional findings. Review of the MIP images confirms the above findings. IMPRESSION: 1. No evidence of pulmonary embolus. 2. Cardiomegaly, bilateral perihilar ground-glass airspace disease, and bilateral pleural effusions most consistent with congestive heart failure. 3. Nonspecific mediastinal adenopathy, which may be reactive. 4. Aortic  Atherosclerosis (ICD10-I70.0). Coronary artery atherosclerosis. Electronically Signed   By: MRanda NgoM.D.   On: 11/05/2022 10:56   DG Chest Portable 1 View  Result Date: 11/05/2022 CLINICAL DATA:  72year old male with chest pain, shortness of breath, hypoxia, possible sepsis. EXAM: PORTABLE CHEST 1 VIEW COMPARISON:  Chest radiographs 09/15/2021 and earlier. FINDINGS: Portable AP upright view at 0842 hours. Stable cardiomegaly and mediastinal contours. No pneumothorax. No consolidation. Symmetric increased bilateral pulmonary interstitial opacity and veiling opacity at the right lateral costophrenic angle. No definite left pleural effusion. No acute osseous abnormality identified. Visualized tracheal air column is within normal limits. Negative visible bowel gas. IMPRESSION: Chronic cardiomegaly with symmetric increased interstitial opacity most likely pulmonary edema, with probable small right pleural effusion. Viral/atypical respiratory infection felt less likely. Electronically Signed   By: HGenevie AnnM.D.   On: 11/05/2022 08:54    Assessment and Plan:   #NSTEMI #CTO RCA #Covid 19 Infection  #Sepsis #Acute Systolic HF -Admitted with feeling poorly after starting Ozempic.  Now with COVID-19 infection. -Reports chest discomfort with vomiting.  Troponins are elevated.  BNP elevated. -Suspect he has newly reduced LV dysfunction.  His troponin is likely secondary to this.  Would recommend diuresis with 40 mg of IV Lasix twice daily. -Follow-up echo. -Known history of CTO of the RCA.  Did have residual disease in a diagonal and circumflex.  Suspect this is a secondary process.  Plan to treat this medically with 48 hours of heparin.  Continue aspirin 81 mg daily as well.  No chest discomfort reported. -Statin intolerant.  Continue Zetia. -For heart failure regimen continue home carvedilol 6.25 mg twice daily, losartan 25 mg daily.  Add Aldactone 25 mg daily. -Transition to [place] as we are able.   Also may benefit from SGLT2 inhibitor. -Symptoms could also be due to COVID-19 myocarditis.  Regardless suspect he has a new cardiomyopathy.  Best option is medical management at this time.  Further recommendations pending echo. -Antibiotics per primary team.  For questions or updates, please contact [place] Please consult [email or web address] for contact info under    Signed, [place] T. Audie Box, MD, [place]  11/05/2022 1:19 PM

## 2022-11-06 ENCOUNTER — Inpatient Hospital Stay (HOSPITAL_COMMUNITY): Payer: HMO

## 2022-11-06 DIAGNOSIS — I214 Non-ST elevation (NSTEMI) myocardial infarction: Secondary | ICD-10-CM

## 2022-11-06 LAB — CBC
HCT: 32.6 % — ABNORMAL LOW (ref 39.0–52.0)
Hemoglobin: 10.3 g/dL — ABNORMAL LOW (ref 13.0–17.0)
MCH: 26.4 pg (ref 26.0–34.0)
MCHC: 31.6 g/dL (ref 30.0–36.0)
MCV: 83.6 fL (ref 80.0–100.0)
Platelets: 198 10*3/uL (ref 150–400)
RBC: 3.9 MIL/uL — ABNORMAL LOW (ref 4.22–5.81)
RDW: 17.7 % — ABNORMAL HIGH (ref 11.5–15.5)
WBC: 3.9 10*3/uL — ABNORMAL LOW (ref 4.0–10.5)
nRBC: 0 % (ref 0.0–0.2)

## 2022-11-06 LAB — ECHOCARDIOGRAM COMPLETE
AR max vel: 3.07 cm2
AV Area VTI: 3.43 cm2
AV Area mean vel: 3.66 cm2
AV Mean grad: 2.3 mmHg
AV Peak grad: 5.1 mmHg
Ao pk vel: 1.13 m/s
Area-P 1/2: 2.84 cm2
Height: 66 in
MV M vel: 4.71 m/s
MV Peak grad: 88.7 mmHg
Radius: 0.6 cm
S' Lateral: 3.6 cm
Weight: 4048 oz

## 2022-11-06 LAB — BASIC METABOLIC PANEL
Anion gap: 10 (ref 5–15)
BUN: 21 mg/dL (ref 8–23)
CO2: 22 mmol/L (ref 22–32)
Calcium: 8.2 mg/dL — ABNORMAL LOW (ref 8.9–10.3)
Chloride: 105 mmol/L (ref 98–111)
Creatinine, Ser: 1.03 mg/dL (ref 0.61–1.24)
GFR, Estimated: >60 mL/min (ref >60)
Glucose, Bld: 274 mg/dL — ABNORMAL HIGH (ref 70–99)
Potassium: 3.7 mmol/L (ref 3.5–5.1)
Sodium: 137 mmol/L (ref 135–145)

## 2022-11-06 LAB — D-DIMER, QUANTITATIVE: D-Dimer, Quant: 0.82 ug/mL-FEU — ABNORMAL HIGH (ref 0.00–0.50)

## 2022-11-06 LAB — GLUCOSE, CAPILLARY
Glucose-Capillary: 249 mg/dL — ABNORMAL HIGH (ref 70–99)
Glucose-Capillary: 294 mg/dL — ABNORMAL HIGH (ref 70–99)
Glucose-Capillary: 366 mg/dL — ABNORMAL HIGH (ref 70–99)
Glucose-Capillary: 265 mg/dL — ABNORMAL HIGH (ref 70–99)

## 2022-11-06 LAB — SEDIMENTATION RATE: Sed Rate: 36 mm/hr — ABNORMAL HIGH (ref 0–16)

## 2022-11-06 LAB — URINE CULTURE: Culture: NO GROWTH

## 2022-11-06 LAB — FERRITIN: Ferritin: 97 ng/mL (ref 24–336)

## 2022-11-06 LAB — HEPARIN LEVEL (UNFRACTIONATED)
Heparin Unfractionated: 0.43 IU/mL (ref 0.30–0.70)
Heparin Unfractionated: 0.4 IU/mL (ref 0.30–0.70)

## 2022-11-06 MED ORDER — CARVEDILOL 6.25 MG PO TABS
6.2500 mg | ORAL_TABLET | Freq: Two times a day (BID) | ORAL | Status: DC
  Administered 2022-11-06 – 2022-11-08 (×5): 6.25 mg via ORAL
  Filled 2022-11-06 (×4): qty 1

## 2022-11-06 MED ORDER — PERFLUTREN LIPID MICROSPHERE
1.0000 mL | INTRAVENOUS | Status: AC | PRN
  Administered 2022-11-06: 4 mL via INTRAVENOUS

## 2022-11-06 MED ORDER — FUROSEMIDE 10 MG/ML IJ SOLN
40.0000 mg | Freq: Two times a day (BID) | INTRAMUSCULAR | Status: DC
  Administered 2022-11-06 – 2022-11-08 (×4): 40 mg via INTRAVENOUS
  Filled 2022-11-06 (×4): qty 4

## 2022-11-06 NOTE — Evaluation (Signed)
Physical Therapy Evaluation Patient Details Name: [name] MRN: [id number] DOB: March 23, 1951 Today's Date: 11/06/2022  History of Present Illness  Pt is 72 year old presented to [facility] on  N/V and dyspnea. Pt with covid 19 infection with sepsis and chf exacerbation. PMH - morbid obesity, dm, htn, CAD, chf, OA, back pain  Clinical Impression  Pt at or very close to baseline with mobility. Able to amb in hallway on RA with SpO2 >95%. Used bariatric rollator. Has been borrowing a rollator at home but will need one of his own. No further PT recommended.        Recommendations for follow up therapy are one component of a multi-disciplinary discharge planning process, led by the attending physician.  Recommendations may be updated based on patient status, additional functional criteria and insurance authorization.  Follow Up Recommendations No PT follow up      Assistance Recommended at Discharge None  Patient can return home with the following       Equipment Recommendations Rollator (4 wheels) (bariatric)  Recommendations for Other Services       Functional Status Assessment Patient has not had a recent decline in their functional status     Precautions / Restrictions Precautions Precautions: Other (comment) Precaution Comments: covid      Mobility  Bed Mobility Overal bed mobility: Modified Independent                  Transfers Overall transfer level: Modified independent Equipment used: None, Rollator (4 wheels)                    Ambulation/Gait Ambulation/Gait assistance: Modified independent (Device/Increase time), Supervision Gait Distance (Feet): 175 Feet Assistive device: Rollator (4 wheels) Gait Pattern/deviations: Step-through pattern, Decreased stride length Gait velocity: decr Gait velocity interpretation: 1.31 - 2.62 ft/sec, indicative of limited community ambulator   General Gait Details: Supervision for monitoring of O2 and covid  precautions. Fatigued toward end of amb as pt states that he hadn't walked that far in quite some time.  Stairs            Wheelchair Mobility    Modified Rankin (Stroke Patients Only)       Balance Overall balance assessment: Mild deficits observed, not formally tested                                           Pertinent Vitals/Pain Pain Assessment Pain Assessment: No/denies pain    Home Living Family/patient expects to be discharged to:: Private residence Living Arrangements: Alone (Has someone that rents a room from him that is there intermittently)   Type of Home: House Home Access: Ramped entrance       Home Layout: One level Home Equipment: Tub bench (walking stick) Additional Comments: Pt has been borrowing a rollator    Prior Function Prior Level of Function : Independent/Modified Independent             Mobility Comments: Amb with no device inside or walking stick. Uses borrowed rollator outside       Hand Dominance        Extremity/Trunk Assessment   Upper Extremity Assessment Upper Extremity Assessment: Overall WFL for tasks assessed    Lower Extremity Assessment Lower Extremity Assessment: Generalized weakness       Communication   Communication: No difficulties  Cognition Arousal/Alertness: Awake/alert Behavior During  Therapy: WFL for tasks assessed/performed Overall Cognitive Status: Within Functional Limits for tasks assessed                                          General Comments General comments (skin integrity, edema, etc.): Amb on RA with SpO2 >95%.    Exercises     Assessment/Plan    PT Assessment Patient does not need any further PT services  PT Problem List         PT Treatment Interventions      PT Goals (Current goals can be found in the Care Plan section)  Acute Rehab PT Goals PT Goal Formulation: All assessment and education complete, DC therapy    Frequency        Co-evaluation               AM-PAC PT "6 Clicks" Mobility  Outcome Measure Help needed turning from your back to your side while in a flat bed without using bedrails?: None Help needed moving from lying on your back to sitting on the side of a flat bed without using bedrails?: None Help needed moving to and from a bed to a chair (including a wheelchair)?: None Help needed standing up from a chair using your arms (e.g., wheelchair or bedside chair)?: None Help needed to walk in hospital room?: None Help needed climbing 3-5 steps with a railing? : A Little 6 Click Score: 23    End of Session   Activity Tolerance: Patient tolerated treatment well Patient left: in bed;with call bell/phone within reach (sitting EOB) Nurse Communication: Mobility status PT Visit Diagnosis: Muscle weakness (generalized) (M62.81)    Time: [id number] PT Time Calculation (min) (ACUTE ONLY): 18 min   Charges:   PT Evaluation $PT Eval Low Complexity: 1 Low          [place] [facility] [place] 11/06/2022, 4:09 PM

## 2022-11-06 NOTE — Progress Notes (Signed)
ANTICOAGULATION CONSULT NOTE - follow-up  Pharmacy Consult for heparin  Indication: chest pain/ACS  Allergies  Allergen Reactions   Bupropion Nausea Only   Ozempic (0.25 Or 0.5 Mg-Dose) [Semaglutide(0.25 Or 0.'5mg'$ -Dos)] Nausea And Vomiting   Atorvastatin Other (See Comments)    Body aches Similar effect with rosuvastatin 40 mg twice weekly    Patient Measurements: Height: '5\' 6"'$  (167.6 cm) Weight: 114.8 kg (253 lb) (stated per pt) IBW/kg (Calculated) : 63.8 Heparin Dosing Weight: 90.1  Vital Signs: Temp: 97.7 F (36.5 C) (03/10 1210) Temp Source: Oral (03/10 1210) BP: 104/47 (03/10 1210) Pulse Rate: 63 (03/10 1210)  Labs: Recent Labs    11/05/22 0824 11/05/22 0839 11/05/22 1055 11/05/22 2016 11/06/22 0112 11/06/22 1404  HGB 11.1*  --   --   --  10.3*  --   HCT 35.6*  --   --   --  32.6*  --   PLT 202  --   --   --  198  --   APTT  --  32  --   --   --   --   LABPROT  --  14.9  --   --   --   --   INR  --  1.2  --   --   --   --   HEPARINUNFRC  --   --   --  0.25* 0.43 0.40  CREATININE 1.12  --   --   --  1.03  --   TROPONINIHS 1,268*  --  1,880*  --   --   --      Estimated Creatinine Clearance: 77.2 mL/min (by C-G formula based on SCr of 1.03 mg/dL).   Medical History: Past Medical History:  Diagnosis Date   Back injury 02/2002   worker's comp   CHF (congestive heart failure) ([place])    Coronary artery disease, non-occlusive    a. cath 2002 with no sig CAD;  b. cath 2008 normal LM, LAD, LCx, p&dRCA 20-30%, PDA 30%; c.11/2015 NSTEMI/PCI: LM nl, LAD 95p (2.5x15 Xience DES), LCX nl, RCA 100p/m w/ L->R collats, EF 55-65% c. NSTEMI (02/2016) with no culprit leision, switched to Brilinta.  d. NSTEMI 03/2016: again, no culprit lesion and switched back to plavix 2/2 SOB with Brilnta.     Depression    Diabetes mellitus type 2, insulin dependent ([place])    Hyperlipemia    Hypertensive heart disease    Kidney stones    Morbid obesity ([place])    Osteoarthritis    Snoring      Assessment: Patient admitted for concern of sepsis, trop elevated to 1268. Differential includes demand ischemia vs. Myocarditis. Patient not on anticoag PTA, however on DAPT with ASA/Plavix. Cardiology to be consulted. Pharmacy consulted to dose heparin.   Heparin level today remains therapeutic at 0.40, on 1200 units/hr. Hgb 10.3, plt 198--stable. No line issues or signs/symptoms of bleeding reported. Per cardiology note, heparin to infuse x48hrs for medical management--will place an end date on the heparin order.   Goal of Therapy:  Heparin level 0.3-0.7 units/ml Monitor platelets by anticoagulation protocol: Yes   Plan:  -Continue heparin gtt '@1200'$  units/hr -Monitor CBC, heparin level, and s/sx of bleeding daily -Plan to treat with heparin x 48 hours per cardiology    Billey Gosling, PharmD PGY1 Pharmacy Resident 3/10/20243:04 PM

## 2022-11-06 NOTE — Care Management (Signed)
  Transition of Care [facility]) Screening Note   Patient Details  Name: [name] Date of Birth: 04/27/1951   Transition of Care [facility]) CM/SW Contact:    Bethena Roys, RN Phone Number: 11/06/2022, 4:23 PM    Transition of Care Department [facility]) has reviewed the patient. Case Manager received a secure chat from physical therapy that the patient needs a bariatric rollator- patient does not weigh more than the guidelines of 300 lbs. Case Manager spoke with patient and he has no agency preference. Case Manager submitted the referral to [facility]- DME will be delivered to the room on 11-07-22. We will continue to monitor patient advancement through interdisciplinary progression rounds. If new patient transition needs arise, please place a TOC consult.

## 2022-11-06 NOTE — Progress Notes (Signed)
Cardiology Progress Note  Patient ID: [name] MRN: [id number] DOB: 1951/01/06 Date of Encounter: 11/06/2022  Primary Cardiologist: None  Subjective   Chief Complaint: None.   HPI: Denies chest pain or trouble breathing.  I's and O's inaccurate.  ROS:  All other ROS reviewed and negative. Pertinent positives noted in the HPI.     Inpatient Medications  Scheduled Meds:  aspirin EC  81 mg Oral Daily   carvedilol  6.25 mg Oral Daily   cholecalciferol  2,000 Units Oral Daily   clopidogrel  75 mg Oral Q breakfast   cyanocobalamin  1,000 mcg Oral Daily   dexamethasone (DECADRON) injection  6 mg Intravenous Q24H   ezetimibe  10 mg Oral Daily   furosemide  40 mg Intravenous Daily   insulin aspart  0-15 Units Subcutaneous TID WC   insulin aspart  4 Units Subcutaneous TID WC   insulin glargine-yfgn  35 Units Subcutaneous QHS   isosorbide mononitrate  30 mg Oral BID   losartan  50 mg Oral Daily   pantoprazole (PROTONIX) IV  40 mg Intravenous Q24H   sodium chloride flush  3 mL Intravenous Q12H   spironolactone  25 mg Oral Daily   venlafaxine XR  150 mg Oral Q breakfast   venlafaxine XR  75 mg Oral Q breakfast   Continuous Infusions:  sodium chloride     heparin 1,200 Units/hr (11/06/22 1111)   remdesivir 100 mg in sodium chloride 0.9 % 100 mL IVPB Stopped (11/06/22 0859)   PRN Meds: sodium chloride, acetaminophen, HYDROcodone-acetaminophen, hydrOXYzine, ipratropium-albuterol, melatonin, nitroGLYCERIN, ondansetron (ZOFRAN) IV, sodium chloride flush   Vital Signs   Vitals:   11/05/22 2010 11/05/22 2302 11/06/22 0311 11/06/22 0812  BP: (!) 172/81 126/71 131/65 126/75  Pulse: 84 70 64 62  Resp: (!) '21 14 13 16  '$ Temp:  98.2 F (36.8 C) 98.3 F (36.8 C) 98.2 F (36.8 C)  TempSrc:  Oral Oral Axillary  SpO2: 99% 95% 100% 100%  Weight:      Height:        Intake/Output Summary (Last 24 hours) at 11/06/2022 1210 Last data filed at 11/06/2022 1111 Gross per 24 hour   Intake 1070.17 ml  Output 800 ml  Net 270.17 ml      11/05/2022    6:40 PM 11/05/2022    8:31 AM 08/18/2022    3:39 PM  Last 3 Weights  Weight (lbs) 253 lb 252 lb 259 lb 8 oz  Weight (kg) 114.76 kg 114.306 kg 117.708 kg      Telemetry  Overnight telemetry shows sinus rhythm 70s, which I personally reviewed.   ECG  The most recent ECG shows ST with PACs, which I personally reviewed.   Physical Exam   Vitals:   11/05/22 2010 11/05/22 2302 11/06/22 0311 11/06/22 0812  BP: (!) 172/81 126/71 131/65 126/75  Pulse: 84 70 64 62  Resp: (!) '21 14 13 16  '$ Temp:  98.2 F (36.8 C) 98.3 F (36.8 C) 98.2 F (36.8 C)  TempSrc:  Oral Oral Axillary  SpO2: 99% 95% 100% 100%  Weight:      Height:        Intake/Output Summary (Last 24 hours) at 11/06/2022 1210 Last data filed at 11/06/2022 1111 Gross per 24 hour  Intake 1070.17 ml  Output 800 ml  Net 270.17 ml       11/05/2022    6:40 PM 11/05/2022    8:31 AM 08/18/2022    3:39 PM  Last 3 Weights  Weight (lbs) 253 lb 252 lb 259 lb 8 oz  Weight (kg) 114.76 kg 114.306 kg 117.708 kg    Body mass index is 40.84 kg/m.   General: Well nourished, well developed, in no acute distress Head: Atraumatic, normal size  Eyes: PEERLA, EOMI  Neck: Supple, no JVD Endocrine: No thryomegaly Cardiac: Normal S1, S2; RRR; no murmurs, rubs, or gallops Lungs: Diminished breath sounds bilaterally Abd: Soft, nontender, no hepatomegaly  Ext: 1+ pitting edema Musculoskeletal: No deformities, BUE and BLE strength normal and equal Skin: Warm and dry, no rashes   Neuro: Alert and oriented to person, place, time, and situation, CNII-XII grossly intact, no focal deficits  Psych: Normal mood and affect   Labs  High Sensitivity Troponin:   Recent Labs  Lab 11/05/22 0824 11/05/22 1055  TROPONINIHS 1,268* 1,880*     Cardiac EnzymesNo results for input(s): "TROPONINI" in the last 168 hours. No results for input(s): "TROPIPOC" in the last 168 hours.   Chemistry Recent Labs  Lab 11/05/22 0824 11/06/22 0112  NA 137 137  K 3.8 3.7  CL 103 105  CO2 20* 22  GLUCOSE 299* 274*  BUN 20 21  CREATININE 1.12 1.03  CALCIUM 8.5* 8.2*  PROT 7.2  --   ALBUMIN 3.5  --   AST 31  --   ALT 16  --   ALKPHOS 73  --   BILITOT 0.8  --   GFRNONAA >60 >60  ANIONGAP 14 10    Hematology Recent Labs  Lab 11/05/22 0824 11/06/22 0112  WBC 6.8 3.9*  RBC 4.26 3.90*  HGB 11.1* 10.3*  HCT 35.6* 32.6*  MCV 83.6 83.6  MCH 26.1 26.4  MCHC 31.2 31.6  RDW 17.8* 17.7*  PLT 202 198   BNP Recent Labs  Lab 11/05/22 0824  BNP 1,240.5*    DDimer  Recent Labs  Lab 11/05/22 0824 11/06/22 0112  DDIMER 1.56* 0.82*     Radiology  CT Angio Chest PE W and/or Wo Contrast  Result Date: 11/05/2022 CLINICAL DATA:  Positive D-dimer, chest pain, short of breath, hypoxia, possible sepsis EXAM: CT ANGIOGRAPHY CHEST WITH CONTRAST TECHNIQUE: Multidetector CT imaging of the chest was performed using the standard protocol during bolus administration of intravenous contrast. Multiplanar CT image reconstructions and MIPs were obtained to evaluate the vascular anatomy. RADIATION DOSE REDUCTION: This exam was performed according to the departmental dose-optimization program which includes automated exposure control, adjustment of the mA and/or kV according to patient size and/or use of iterative reconstruction technique. CONTRAST:  66m OMNIPAQUE IOHEXOL 350 MG/ML SOLN COMPARISON:  09/01/2009, 11/05/2022 FINDINGS: Cardiovascular: This is a technically adequate evaluation of the pulmonary vasculature. No filling defects or pulmonary emboli. Mild cardiomegaly with prominent left ventricular dilatation. Normal caliber of the thoracic aorta. Atherosclerosis of the aorta and coronary vasculature. Mediastinum/Nodes: Mediastinal adenopathy measuring up to 16 mm in the subcarinal station, nonspecific. No pathologic hilar or axillary lymph nodes. Thyroid, trachea, and esophagus are  grossly unremarkable. Lungs/Pleura: There are small bilateral pleural effusions, volume estimated less than 500 cc each. Bilateral perihilar ground-glass airspace disease most compatible with edema. No pneumothorax. The central airways are patent. Upper Abdomen: No acute abnormality. Musculoskeletal: No acute or destructive bony lesions. Reconstructed images demonstrate no additional findings. Review of the MIP images confirms the above findings. IMPRESSION: 1. No evidence of pulmonary embolus. 2. Cardiomegaly, bilateral perihilar ground-glass airspace disease, and bilateral pleural effusions most consistent with congestive heart failure. 3. Nonspecific mediastinal adenopathy,  which may be reactive. 4. Aortic Atherosclerosis (ICD10-I70.0). Coronary artery atherosclerosis. Electronically Signed   By: Randa Ngo M.D.   On: 11/05/2022 10:56   DG Chest Portable 1 View  Result Date: 11/05/2022 CLINICAL DATA:  72 year old male with chest pain, shortness of breath, hypoxia, possible sepsis. EXAM: PORTABLE CHEST 1 VIEW COMPARISON:  Chest radiographs 09/15/2021 and earlier. FINDINGS: Portable AP upright view at 0842 hours. Stable cardiomegaly and mediastinal contours. No pneumothorax. No consolidation. Symmetric increased bilateral pulmonary interstitial opacity and veiling opacity at the right lateral costophrenic angle. No definite left pleural effusion. No acute osseous abnormality identified. Visualized tracheal air column is within normal limits. Negative visible bowel gas. IMPRESSION: Chronic cardiomegaly with symmetric increased interstitial opacity most likely pulmonary edema, with probable small right pleural effusion. Viral/atypical respiratory infection felt less likely. Electronically Signed   By: Genevie Ann M.D.   On: 11/05/2022 08:54    Cardiac Studies  TTE 04/01/2020  1. Left ventricular ejection fraction, by estimation, is 60 to 65%. Left  ventricular ejection fraction by PLAX is 61 %. The left  ventricle has  normal function. The left ventricle has no regional wall motion  abnormalities. There is mild left ventricular  hypertrophy. Left ventricular diastolic parameters are consistent with  Grade I diastolic dysfunction (impaired relaxation).   2. Right ventricular systolic function is normal. The right ventricular  size is normal. There is normal pulmonary artery systolic pressure.   3. Left atrial size was mildly dilated.   4. Right atrial size was mildly dilated.   5. The mitral valve was not well visualized. Trivial mitral valve  regurgitation.   6. The aortic valve was not well visualized. Aortic valve regurgitation  is not visualized.   Patient Profile  TALLAN HIERS is a 72 y.o. male with diastolic heart failure, hypertension, CAD (PCI to LAD, CTO RCA), diabetes, obesity who was admitted on 11/05/2022 with COVID-19 infection.  Cardiology consulted for acute heart failure and non-STEMI.  Assessment & Plan   # Non-STEMI # CAD with PCI to LAD and CTO RCA -Admitted with volume overload and COVID-19 infection.  Troponins are elevated.  Reports no chest pain.  Did have chest pain on admission but this with occurred with vomiting. -For now recommend medical management.  Follow-up echo for definitive recommendations.  Given COVID-19 infection likely best option is medical management with consideration for repeat heart catheterization pending results of echo. -On aspirin and Plavix at home.  Continue this.  On heparin drip.  Plan for 48 hours of medical management. -Statin intolerant.  Continue Zetia.  Consider outpatient PCSK9 inhibitor therapy.  # Acute heart failure -Known history of diastolic heart failure.  BNP is elevated.  Suspect his LVEF is reduced.  Could be due to COVID-19 infection versus MI.  See discussion above. -For now continue with diuresis.  Still volume up.  40 mg IV Lasix twice daily. -On home losartan.  Continue.  Add Aldactone 25 mg daily.  Continue carvedilol  6.25 mg twice daily.  Transition to [facility] pending results of echo.  # COVID-19 infection -Per hospital medicine      For questions or updates, please contact [place] Please consult [email or web address] for contact info under        Signed, [place] T. Audie Box, MD, [place]  11/06/2022 12:10 PM

## 2022-11-06 NOTE — Progress Notes (Signed)
Echocardiogram 2D Echocardiogram has been performed.  Oneal Deputy Reinhart Saulters RDCS 11/06/2022, 2:01 PM

## 2022-11-06 NOTE — Progress Notes (Signed)
ANTICOAGULATION CONSULT NOTE - follow-up  Pharmacy Consult for heparin  Indication: chest pain/ACS  Allergies  Allergen Reactions   Bupropion Nausea Only   Ozempic (0.25 Or 0.5 Mg-Dose) [Semaglutide(0.25 Or 0.'5mg'$ -Dos)] Nausea And Vomiting   Atorvastatin Other (See Comments)    Body aches Similar effect with rosuvastatin 40 mg twice weekly    Patient Measurements: Height: '5\' 6"'$  (167.6 cm) Weight: 114.8 kg (253 lb) (stated per pt) IBW/kg (Calculated) : 63.8 Heparin Dosing Weight: 90.1  Vital Signs: Temp: 98.3 F (36.8 C) (03/10 0311) Temp Source: Oral (03/10 0311) BP: 131/65 (03/10 0311) Pulse Rate: 64 (03/10 0311)  Labs: Recent Labs    11/05/22 0824 11/05/22 0839 11/05/22 1055 11/05/22 2016 11/06/22 0112  HGB 11.1*  --   --   --  10.3*  HCT 35.6*  --   --   --  32.6*  PLT 202  --   --   --  198  APTT  --  32  --   --   --   LABPROT  --  14.9  --   --   --   INR  --  1.2  --   --   --   HEPARINUNFRC  --   --   --  0.25* 0.43  CREATININE 1.12  --   --   --  1.03  TROPONINIHS 1,268*  --  1,880*  --   --      Estimated Creatinine Clearance: 77.2 mL/min (by C-G formula based on SCr of 1.03 mg/dL).   Medical History: Past Medical History:  Diagnosis Date   Back injury 02/2002   worker's comp   CHF (congestive heart failure) ([place])    Coronary artery disease, non-occlusive    a. cath 2002 with no sig CAD;  b. cath 2008 normal LM, LAD, LCx, p&dRCA 20-30%, PDA 30%; c.11/2015 NSTEMI/PCI: LM nl, LAD 95p (2.5x15 Xience DES), LCX nl, RCA 100p/m w/ L->R collats, EF 55-65% c. NSTEMI (02/2016) with no culprit leision, switched to Brilinta.  d. NSTEMI 03/2016: again, no culprit lesion and switched back to plavix 2/2 SOB with Brilnta.     Depression    Diabetes mellitus type 2, insulin dependent ([place])    Hyperlipemia    Hypertensive heart disease    Kidney stones    Morbid obesity ([place])    Osteoarthritis    Snoring     Assessment: Patient admitted for concern of sepsis,  trop elevated to 1268. Differential includes demand ischemia vs. Myocarditis. Patient not on anticoag PTA, however on DAPT with ASA/Plavix. Cardiology to be consulted. Pharmacy consulted to dose heparin.   Heparin level today is therapeutic at 0.43, on 1200 units/hr. Hgb 10.3, plt 198--stable. No line issues or signs/symptoms of bleeding reported.  Goal of Therapy:  Heparin level 0.3-0.7 units/ml Monitor platelets by anticoagulation protocol: Yes   Plan:  -Continue heparin gtt '@1200'$  units/hr -F/u 8hr confirmatory heparin level  -Monitor CBC, heparin level, and s/sx of bleeding daily -Plan to treat with heparin x 48 hours per cardiology    Billey Gosling, PharmD PGY1 Pharmacy Resident 3/10/20247:21 AM

## 2022-11-06 NOTE — Progress Notes (Signed)
PROGRESS NOTE  [name]  DOB: 03-03-1951  PCP: Jinny Sanders, MD QE:[id number]  DOA: 11/05/2022  LOS: 1 day  Hospital Day: 2  Brief narrative: JHOAN PIEROTTI is a 72 y.o. male with PMH significant for morbid obesity, DM2, HTN, HLD, CAD/NSTEMI 0000000, chronic diastolic CHF, osteoarthritis, back injury, nephrolithiasis. 3/9, patient presented to the ED with complaint of nausea, vomiting and dyspnea for the last 3 days. 3/6, patient was prescribed Ozempic by PCP to help aid weight loss and better diabetes control. Upon using the first dose, patient felt sick to his stomach and had about 5 episodes of nausea and vomiting.  He states he subsequently developed dyspnea, chest pain that worsened over the next few days.  He reports compliance with his cardiac medications including diuretics and antiplatelet agents.   With worsening symptoms, presented to the ED on 3/9.  In the ED, patient had a fever of 102.4 with tachycardia, tachypnea, blood pressure maintained WBC count 6.8, creatinine 1.12, lactate elevated at 2.5 troponin 1268->1880.  BNP elevated at 1240.   EKG showed sinus tachycardia with repolarization abnormality in lateral leads, QTc 409 ms.   Respiratory virus panel positive for COVID D-dimer was elevated as well CTPA was negative for pulm embolism but showed findings of CHF Blood culture sent. Initially, given sepsis parameters, patient was started on IV fluid.  But with the possibility of CHF noted in imaging, IVF was stopped and patient was given 1 dose of IV Lasix Cardiology was consulted and per recommendation started on heparin drip Admitted to [facility]  Subjective: Patient was seen and examined this morning.  Pleasant elderly Caucasian male.  Sitting up at the edge of the bed.  On 2 L oxygen by nasal cannula.  Feels better than at presentation.  On heparin drip.  No family at bedside. Chart reviewed No recurrence of fever after the initial 102.4 in the ED.  Hemodynamically  stable.  On 2 L oxygen overnight  Assessment and plan: Sepsis POA COVID-19 infection  Present with fever, tachycardia, tachypnea, elevated lactic acid and COVID PCR positive  No pneumonia and imaging.  Procalcitonin not elevated. On admission, patient was started on remdesivir, Decadron and bronchodilators. Add antitussives as needed, incentive spirometry WBC count and inflammatory markers trend as below. Recent Labs  Lab 11/05/22 0824 11/05/22 0839 11/05/22 0850 11/05/22 1055 11/05/22 2016 11/06/22 0112  SARSCOV2NAA  --   --  POSITIVE*  --   --   --   WBC 6.8  --   --   --   --  3.9*  LATICACIDVEN  --  2.5*  --  1.9  --   --   PROCALCITON  --   --   --   --  <0.10  --   DDIMER 1.56*  --   --   --   --  0.82*  FERRITIN  --   --   --   --   --  97  LDH  --   --   --   --  227*  --   CRP  --   --   --   --  6.6*  --   ALT 16  --   --   --   --   --    NSTEMI History of CAD, HLD Troponin significantly elevated, hard to attribute to demand ischemia only. Currently on heparin drip per recommendation by cardiology. PTA on Coreg 6.25 mg daily (??  Not BID), aspirin  81 mg daily, Plavix 75 mg daily, Zetia 10 mg daily (intolerant to statin) Continue all Recent Labs    11/05/22 0824 11/05/22 1055  TROPONINIHS 1,268* 1,880*     Acute exacerbation of chronic diastolic CHF  Chronic bilateral lower extremity edema Essential hypertension BNP elevated.  CT chest with findings of CHF. Given IV Lasix 40 mg dose in the ED. Most recent echo from 2021 with EF 60 to 65%, mild LVH, grade 1 diastolic dysfunction PTA on Coreg 6.25 mg daily, Lasix 40 mg daily, Imdur 30 mg twice daily, losartan 50 mg daily Continue all.  Aldactone 25 mg daily was added by cardiology on admission. Net IO Since Admission: 907.99 mL [11/06/22 1102] Continue to monitor for daily intake output, weight, blood pressure, BNP, renal function and electrolytes. Recent Labs  Lab 11/05/22 0824 11/06/22 0112  BNP  1,240.5*  --   BUN 20 21  CREATININE 1.12 1.03  K 3.8 3.7  MG 1.9  --    Acute hypoxic respiratory failure Secondary to combination of COVID and CHF flareup.   At rest on 2 to 3 L oxygen to maintain saturation over 90%.  Check ambulatory oxygen requirement.  Not on supplemental oxygen at home.  Nausea/vomiting Patient reports her nausea/vomiting started after she was initiated on Ozempic.  Being a GLP-1 agonist, Ozempic delayed gastric emptying and hence could have led to the symptoms.   Symptom improved.  Able to tolerate diet. Currently on IV Protonix 40 mg daily, IV Zofran as needed  Type 2 diabetes mellitus A1c 7.1 in December 2023 Was recently switched from [place] 50 units daily to [place] Currently ordered for [place] 35 units nightly, NovoLog Premeal schedule III times 3 times daily and sliding scale insulin Blood sugar may run further elevated because of use of Decadron. Given intolerance to Ozempic, plan to resume [place] at discharge Continue sliding scale insulin with Accu-Cheks. Lab Results  Component Value Date   HGBA1C 7.1 (A) 08/18/2022   Recent Labs  Lab 11/05/22 1641 11/05/22 2150 11/06/22 0619  GLUCAP 211* 294* 249*     Vitamin B12 deficiency Continue vitamin B12. Recent Labs    02/01/22 0752 11/05/22 0824 11/06/22 0112  HGB  --  11.1* 10.3*  MCV  --  83.6 83.6  VITAMINB12 355  --   --   FERRITIN  --   --  97   Depression/anxiety Continue venlafaxine  Chronic back pain Osteoarthritis Norco as needed   Mobility: Encourage ambulation.  PT eval ordered  Goals of care   Code Status: Full Code     DVT prophylaxis: Heparin drip    Antimicrobials: Antiviral for COVID Fluid: Not on fluid Consultants: Cardiology Family Communication: None at bedside  Status is: Inpatient Level of care: Progressive   Dispo: Patient is from: Home              Anticipated d/c is to: Home Continue in-hospital care because: Pending clinical  course   Scheduled Meds:  aspirin EC  81 mg Oral Daily   carvedilol  6.25 mg Oral Daily   cholecalciferol  2,000 Units Oral Daily   clopidogrel  75 mg Oral Q breakfast   cyanocobalamin  1,000 mcg Oral Daily   dexamethasone (DECADRON) injection  6 mg Intravenous Q24H   ezetimibe  10 mg Oral Daily   furosemide  40 mg Intravenous Daily   insulin aspart  0-15 Units Subcutaneous TID WC   insulin aspart  4 Units Subcutaneous TID WC  insulin glargine-yfgn  35 Units Subcutaneous QHS   isosorbide mononitrate  30 mg Oral BID   losartan  50 mg Oral Daily   pantoprazole (PROTONIX) IV  40 mg Intravenous Q24H   sodium chloride flush  3 mL Intravenous Q12H   spironolactone  25 mg Oral Daily   venlafaxine XR  150 mg Oral Q breakfast   venlafaxine XR  75 mg Oral Q breakfast    PRN meds: sodium chloride, acetaminophen, HYDROcodone-acetaminophen, hydrOXYzine, ipratropium-albuterol, melatonin, nitroGLYCERIN, ondansetron (ZOFRAN) IV, sodium chloride flush   Infusions:   sodium chloride     heparin 1,200 Units/hr (11/06/22 0539)   remdesivir 100 mg in sodium chloride 0.9 % 100 mL IVPB 100 mg (11/06/22 0829)    Diet:  Diet Order             Diet heart healthy/carb modified Room service appropriate? Yes; Fluid consistency: Thin  Diet effective now                   Antimicrobials: Anti-infectives (From admission, onward)    Start     Dose/Rate Route Frequency Ordered Stop   11/06/22 1000  remdesivir 100 mg in sodium chloride 0.9 % 100 mL IVPB       See Hyperspace for full Linked Orders Report.   100 mg 200 mL/hr over 30 Minutes Intravenous Daily 11/05/22 1358 11/08/22 0959   11/05/22 2300  vancomycin (VANCOREADY) IVPB 750 mg/150 mL  Status:  Discontinued        750 mg 150 mL/hr over 60 Minutes Intravenous Every 12 hours 11/05/22 1006 11/05/22 1350   11/05/22 1700  ceFEPIme (MAXIPIME) 2 g in sodium chloride 0.9 % 100 mL IVPB  Status:  Discontinued        2 g 200 mL/hr over 30  Minutes Intravenous Every 8 hours 11/05/22 1006 11/05/22 2103   11/05/22 1400  remdesivir 200 mg in sodium chloride 0.9% 250 mL IVPB       See Hyperspace for full Linked Orders Report.   200 mg 580 mL/hr over 30 Minutes Intravenous Once 11/05/22 1358 11/05/22 1741   11/05/22 0900  vancomycin (VANCOREADY) IVPB 2000 mg/400 mL        2,000 mg 200 mL/hr over 120 Minutes Intravenous  Once 11/05/22 0852 11/05/22 1329   11/05/22 0845  ceFEPIme (MAXIPIME) 2 g in sodium chloride 0.9 % 100 mL IVPB        2 g 200 mL/hr over 30 Minutes Intravenous  Once 11/05/22 0839 11/05/22 0959   11/05/22 0845  metroNIDAZOLE (FLAGYL) IVPB 500 mg        500 mg 100 mL/hr over 60 Minutes Intravenous  Once 11/05/22 0839 11/05/22 1107   11/05/22 0845  vancomycin (VANCOCIN) IVPB 1000 mg/200 mL premix  Status:  Discontinued        1,000 mg 200 mL/hr over 60 Minutes Intravenous  Once 11/05/22 0839 11/05/22 0852       Skin assessment:       Nutritional status:  Body mass index is 40.84 kg/m.          Objective: Vitals:   11/06/22 0311 11/06/22 0812  BP: 131/65 126/75  Pulse: 64 62  Resp: 13 16  Temp: 98.3 F (36.8 C) 98.2 F (36.8 C)  SpO2: 100% 100%    Intake/Output Summary (Last 24 hours) at 11/06/2022 1102 Last data filed at 11/06/2022 0539 Gross per 24 hour  Intake 907.99 ml  Output --  Net 907.99 ml  Filed Weights   11/05/22 0831 11/05/22 1840  Weight: 114.3 kg 114.8 kg   Weight change:  Body mass index is 40.84 kg/m.   Physical Exam: General exam: Pleasant, elderly.  Sitting up at the edge of the bed.  Not in pain Skin: No rashes, lesions or ulcers. HEENT: Atraumatic, normocephalic, no obvious bleeding Lungs: Clear to auscultation bilaterally CVS: Regular rate and rhythm, no murmur GI/Abd soft, nontender, nondistended, bowel sound present CNS: Alert, awake, oriented x 3 Psychiatry: Mood able. Extremities: Trace bilateral pedal edema with chronic stasis changes  Data Review:  I have personally reviewed the laboratory data and studies available.  F/u labs ordered Unresulted Labs (From admission, onward)     Start     Ordered   11/06/22 1400  Heparin level (unfractionated)  Once-Timed,   TIMED        11/06/22 0733   11/06/22 0500  Heparin level (unfractionated)  Daily,   R     See Hyperspace for full Linked Orders Report.   11/05/22 1134   11/06/22 0500  CBC  Daily,   R     See Hyperspace for full Linked Orders Report.   11/05/22 1134   11/06/22 [id number]  Basic metabolic panel  Daily,   R     Comments: As Scheduled for 5 days    11/05/22 1350   11/06/22 0500  D-dimer, quantitative  Daily,   R      11/05/22 1412   11/06/22 0500  Ferritin  Daily,   R      11/05/22 1413   11/05/22 1414  Sedimentation rate  Daily,   R      11/05/22 1413            Total time spent in review of labs and imaging, patient evaluation, formulation of plan, documentation and communication with family: 22 minutes  Signed, Terrilee Croak, MD [facility] 11/06/2022

## 2022-11-07 ENCOUNTER — Other Ambulatory Visit (HOSPITAL_COMMUNITY): Payer: Self-pay

## 2022-11-07 ENCOUNTER — Encounter: Payer: Self-pay | Admitting: *Deleted

## 2022-11-07 ENCOUNTER — Encounter (HOSPITAL_COMMUNITY): Payer: Self-pay | Admitting: Internal Medicine

## 2022-11-07 LAB — BASIC METABOLIC PANEL
Anion gap: 8 (ref 5–15)
BUN: 30 mg/dL — ABNORMAL HIGH (ref 8–23)
CO2: 26 mmol/L (ref 22–32)
Calcium: 8.5 mg/dL — ABNORMAL LOW (ref 8.9–10.3)
Chloride: 105 mmol/L (ref 98–111)
Creatinine, Ser: 1.09 mg/dL (ref 0.61–1.24)
GFR, Estimated: >60 mL/min (ref >60)
Glucose, Bld: 103 mg/dL — ABNORMAL HIGH (ref 70–99)
Potassium: 3.5 mmol/L (ref 3.5–5.1)
Sodium: 139 mmol/L (ref 135–145)

## 2022-11-07 LAB — GLUCOSE, CAPILLARY
Glucose-Capillary: 103 mg/dL — ABNORMAL HIGH (ref 70–99)
Glucose-Capillary: 187 mg/dL — ABNORMAL HIGH (ref 70–99)
Glucose-Capillary: 238 mg/dL — ABNORMAL HIGH (ref 70–99)
Glucose-Capillary: 206 mg/dL — ABNORMAL HIGH (ref 70–99)

## 2022-11-07 LAB — CBC
HCT: 33.8 % — ABNORMAL LOW (ref 39.0–52.0)
Hemoglobin: 10.6 g/dL — ABNORMAL LOW (ref 13.0–17.0)
MCH: 25.9 pg — ABNORMAL LOW (ref 26.0–34.0)
MCHC: 31.4 g/dL (ref 30.0–36.0)
MCV: 82.6 fL (ref 80.0–100.0)
Platelets: 245 10*3/uL (ref 150–400)
RBC: 4.09 MIL/uL — ABNORMAL LOW (ref 4.22–5.81)
RDW: 17.5 % — ABNORMAL HIGH (ref 11.5–15.5)
WBC: 6 10*3/uL (ref 4.0–10.5)
nRBC: 0 % (ref 0.0–0.2)

## 2022-11-07 LAB — D-DIMER, QUANTITATIVE: D-Dimer, Quant: 0.49 ug/mL-FEU (ref 0.00–0.50)

## 2022-11-07 LAB — SEDIMENTATION RATE: Sed Rate: 36 mm/hr — ABNORMAL HIGH (ref 0–16)

## 2022-11-07 LAB — HEPARIN LEVEL (UNFRACTIONATED): Heparin Unfractionated: 0.37 IU/mL (ref 0.30–0.70)

## 2022-11-07 LAB — FERRITIN: Ferritin: 109 ng/mL (ref 24–336)

## 2022-11-07 MED ORDER — MELATONIN 5 MG PO TABS
5.0000 mg | ORAL_TABLET | Freq: Every evening | ORAL | Status: DC | PRN
  Administered 2022-11-07: 5 mg via ORAL
  Filled 2022-11-07: qty 1

## 2022-11-07 MED ORDER — EMPAGLIFLOZIN 10 MG PO TABS
10.0000 mg | ORAL_TABLET | Freq: Every day | ORAL | Status: DC
  Administered 2022-11-07 – 2022-11-08 (×2): 10 mg via ORAL
  Filled 2022-11-07 (×2): qty 1

## 2022-11-07 NOTE — Progress Notes (Signed)
Progress Note  Patient Name: [name] Date of Encounter: 11/07/2022  Primary Cardiologist: Dr. Rockey Situ  Subjective   Overnight patient has been asymptomatic.  He is eager to leave tomorrow as it is his birthday. No CP, SOB, Palpitations.  Inpatient Medications    Scheduled Meds:  aspirin EC  81 mg Oral Daily   carvedilol  6.25 mg Oral BID WC   cholecalciferol  2,000 Units Oral Daily   clopidogrel  75 mg Oral Q breakfast   cyanocobalamin  1,000 mcg Oral Daily   dexamethasone (DECADRON) injection  6 mg Intravenous Q24H   ezetimibe  10 mg Oral Daily   furosemide  40 mg Intravenous BID   insulin aspart  0-15 Units Subcutaneous TID WC   insulin aspart  4 Units Subcutaneous TID WC   insulin glargine-yfgn  35 Units Subcutaneous QHS   isosorbide mononitrate  30 mg Oral BID   losartan  50 mg Oral Daily   pantoprazole (PROTONIX) IV  40 mg Intravenous Q24H   sodium chloride flush  3 mL Intravenous Q12H   spironolactone  25 mg Oral Daily   venlafaxine XR  150 mg Oral Q breakfast   venlafaxine XR  75 mg Oral Q breakfast   Continuous Infusions:  sodium chloride     heparin Stopped (11/07/22 0509)   PRN Meds: sodium chloride, acetaminophen, HYDROcodone-acetaminophen, hydrOXYzine, ipratropium-albuterol, melatonin, nitroGLYCERIN, ondansetron (ZOFRAN) IV, sodium chloride flush   Vital Signs    Vitals:   11/06/22 2353 11/07/22 0412 11/07/22 0522 11/07/22 0800  BP: 117/62 (!) 96/57  (!) 99/46  Pulse: (!) 53 67  66  Resp: '11 18  17  '$ Temp: 98 F (36.7 C) 98 F (36.7 C)  97.6 F (36.4 C)  TempSrc: Oral Oral  Oral  SpO2: 100% 94%  100%  Weight:   117.5 kg   Height:        Intake/Output Summary (Last 24 hours) at 11/07/2022 1133 Last data filed at 11/07/2022 0800 Gross per 24 hour  Intake 455.23 ml  Output 850 ml  Net -394.77 ml   Filed Weights   11/05/22 0831 11/05/22 1840 11/07/22 0522  Weight: 114.3 kg 114.8 kg 117.5 kg    Telemetry    Sinus bradycardia with  frequent PACs - Personally Reviewed  Physical Exam   Gen: no distress, morbid obesity  Cardiac: No Rubs or Gallops, holosystolic murmur, irregular beats,  Respiratory: Decreased breath sounds bilaterally, normal effort, normal  respiratory rate GI: Soft, nontender, distended with fluid wave MS: No  edema;  moves all extremities Integument: Skin feels warm Neuro:  At time of evaluation, alert and oriented to person/place/time/situation  Psych: Normal affect, patient feels great   Labs    Chemistry Recent Labs  Lab 11/05/22 0824 11/06/22 0112 11/07/22 0059  NA 137 137 139  K 3.8 3.7 3.5  CL 103 105 105  CO2 20* 22 26  GLUCOSE 299* 274* 103*  BUN 20 21 30*  CREATININE 1.12 1.03 1.09  CALCIUM 8.5* 8.2* 8.5*  PROT 7.2  --   --   ALBUMIN 3.5  --   --   AST 31  --   --   ALT 16  --   --   ALKPHOS 73  --   --   BILITOT 0.8  --   --   GFRNONAA >60 >60 >60  ANIONGAP '14 10 8     '$ Hematology Recent Labs  Lab 11/05/22 [id number] 11/06/22 0112 11/07/22 0059  WBC 6.8 3.9* 6.0  RBC 4.26 3.90* 4.09*  HGB 11.1* 10.3* 10.6*  HCT 35.6* 32.6* 33.8*  MCV 83.6 83.6 82.6  MCH 26.1 26.4 25.9*  MCHC 31.2 31.6 31.4  RDW 17.8* 17.7* 17.5*  PLT 202 198 245    Cardiac EnzymesNo results for input(s): "TROPONINI" in the last 168 hours. No results for input(s): "TROPIPOC" in the last 168 hours.   BNP Recent Labs  Lab 11/05/22 0824  BNP 1,240.5*     DDimer  Recent Labs  Lab 11/05/22 0824 11/06/22 0112 11/07/22 0059  DDIMER 1.56* 0.82* 0.49     Radiology    ECHOCARDIOGRAM COMPLETE  Result Date: 11/06/2022    ECHOCARDIOGRAM REPORT   Patient Name:   [name] Date of Exam: 11/06/2022 Medical Rec #:  [id number]     Height:       66.0 in Accession #:    [id number]    Weight:       253.0 lb Date of Birth:  12-26-1950     BSA:          2.210 m Patient Age:    71 years      BP:           107/47 mmHg Patient Gender: M             HR:           70 bpm. Exam Location:  Inpatient  Procedure: 2D Echo, Color Doppler, Cardiac Doppler and Intracardiac            Opacification Agent Indications:    NSTEMI  History:        Patient has prior history of Echocardiogram examinations, most                 recent 04/01/2020. CHF, CAD; Risk Factors:Hypertension, Diabetes                 and Dyslipidemia.  Sonographer:    Raquel Sarna Senior RDCS Referring Phys: [id number] [place]  Sonographer Comments: Technically difficult due to poor echo windows, COVID+ at time of study IMPRESSIONS  1. Left ventricular ejection fraction, by estimation, is 40 to 45%. The left ventricle has mildly decreased function. The left ventricle demonstrates global hypokinesis. Left ventricular diastolic parameters are consistent with Grade II diastolic dysfunction (pseudonormalization). Elevated left atrial pressure.  2. Right ventricular systolic function is normal. The right ventricular size is normal. Tricuspid regurgitation signal is inadequate for assessing PA pressure.  3. The mitral valve is abnormal. Mild to moderate mitral valve regurgitation. No evidence of mitral stenosis.  4. The aortic valve has an indeterminant number of cusps. There is mild calcification of the aortic valve. There is mild thickening of the aortic valve. Aortic valve regurgitation is not visualized. No aortic stenosis is present.  5. Aortic dilatation noted. There is mild dilatation of the ascending aorta, measuring 38 mm. FINDINGS  Left Ventricle: Left ventricular ejection fraction, by estimation, is 40 to 45%. The left ventricle has mildly decreased function. The left ventricle demonstrates global hypokinesis. Definity contrast agent was given IV to delineate the left ventricular  endocardial borders. The left ventricular internal cavity size was normal in size. There is no left ventricular hypertrophy. Left ventricular diastolic parameters are consistent with Grade II diastolic dysfunction (pseudonormalization). Elevated left atrial pressure. Right  Ventricle: The right ventricular size is normal. Right vetricular wall thickness was not well visualized. Right ventricular systolic function is normal. Tricuspid regurgitation signal is inadequate for  assessing PA pressure. Left Atrium: Left atrial size was normal in size. Right Atrium: Right atrial size was normal in size. Pericardium: The pericardium was not well visualized. Mitral Valve: The mitral valve is abnormal. Mild to moderate mitral valve regurgitation. No evidence of mitral valve stenosis. Tricuspid Valve: The tricuspid valve is normal in structure. Tricuspid valve regurgitation is not demonstrated. No evidence of tricuspid stenosis. Aortic Valve: The aortic valve has an indeterminant number of cusps. There is mild calcification of the aortic valve. There is mild thickening of the aortic valve. There is mild aortic valve annular calcification. Aortic valve regurgitation is not visualized. No aortic stenosis is present. Aortic valve mean gradient measures 2.3 mmHg. Aortic valve peak gradient measures 5.1 mmHg. Aortic valve area, by VTI measures 3.43 cm. Pulmonic Valve: The pulmonic valve was not well visualized. Pulmonic valve regurgitation is not visualized. No evidence of pulmonic stenosis. Aorta: The aortic root is normal in size and structure and aortic dilatation noted. There is mild dilatation of the ascending aorta, measuring 38 mm. Venous: The inferior vena cava was not well visualized. IAS/Shunts: The interatrial septum was not well visualized.  LEFT VENTRICLE PLAX 2D LVIDd:         4.50 cm   Diastology LVIDs:         3.60 cm   LV e' medial:    4.13 cm/s LV PW:         1.00 cm   LV E/e' medial:  31.0 LV IVS:        1.00 cm   LV e' lateral:   9.46 cm/s LVOT diam:     2.20 cm   LV E/e' lateral: 13.5 LV SV:         78 LV SV Index:   35 LVOT Area:     3.80 cm  RIGHT VENTRICLE RV S prime:     8.59 cm/s TAPSE (M-mode): 1.7 cm LEFT ATRIUM             Index        RIGHT ATRIUM           Index LA diam:         3.70 cm 1.67 cm/m   RA Area:     16.60 cm LA Vol (A2C):   54.4 ml 24.62 ml/m  RA Volume:   36.90 ml  16.70 ml/m LA Vol (A4C):   58.6 ml 26.52 ml/m LA Biplane Vol: 57.7 ml 26.11 ml/m  AORTIC VALVE AV Area (Vmax):    3.07 cm AV Area (Vmean):   3.66 cm AV Area (VTI):     3.43 cm AV Vmax:           112.75 cm/s AV Vmean:          68.878 cm/s AV VTI:            0.228 m AV Peak Grad:      5.1 mmHg AV Mean Grad:      2.3 mmHg LVOT Vmax:         91.00 cm/s LVOT Vmean:        66.300 cm/s LVOT VTI:          0.206 m LVOT/AV VTI ratio: 0.90  AORTA Ao Root diam: 3.30 cm Ao Asc diam:  3.80 cm MITRAL VALVE MV Area (PHT): 2.84 cm       SHUNTS MV Decel Time: 267 msec       Systemic VTI:  0.21 m MR Peak grad:  88.7 mmHg    Systemic Diam: 2.20 cm MR Mean grad:    64.0 mmHg MR Vmax:         471.00 cm/s MR Vmean:        388.0 cm/s MR PISA:         2.26 cm MR PISA Eff ROA: 18 mm MR PISA Radius:  0.60 cm MV E velocity: 128.00 cm/s MV A velocity: 37.70 cm/s MV E/A ratio:  3.40 Carlyle Dolly MD Electronically signed by Carlyle Dolly MD Signature Date/Time: 11/06/2022/2:16:49 PM    Final      Patient Profile     72 y.o. male with NSTEMI and COVID-19  Assessment & Plan    # Non-STEMI # CAD with PCI to LAD and CTO RCA - NSTEMI Peak troponin 1880  (not peaked to trend) asymptomatic with new global HF - discussed options with patient: SDM, planned for 48 hours of heparin ending today, DAPT, then outpatient f/u, if LVEF still depressed or if angina, would pursue LHC -Statin intolerant.  Continue Zetia.  Consider outpatient PCSK9 inhibitor therapy.   # Acute heart failure (combined systolic and diastolic) - Hypervolemic, continue IV diuresis - continue at current dose - If BP rebounds, transition to ARNI tomorrow - continue MRA - adding SGLT2i today   # COVID-19 infection -Per hospital medicine  He is NYHA I and is ambulating.  If he is near euvolemic like cardiology sign off tomorrow (he is eager to  DC)  For questions or updates, please contact [facility] Please consult [email or web address] for contact info under Cardiology/STEMI.      Rudean Haskell, MD [facility] [place] [place], [place] [address] [phone]  11:33 AM

## 2022-11-07 NOTE — Progress Notes (Signed)
Heart Failure Nurse Navigator Progress Note  PCP: Jinny Sanders, MD PCP-Cardiologist: None Admission Diagnosis: Covid-19, Hypoxia, Elevated troponin Admitted from: Home via EMS  Presentation:   [name] presented with new onset A fib and exceptional dyspnea. BLE, N,V, 3 days prior patient started on Ozempic and received his first shot. BP 163/93, HR 111, rectal temp 102.4,BMI 40.67, Covid +, BNP 1,240, Glucose >500, Troponin 1,880. D-dimer elevated, CT negative for PE. CXR showed chronic cardiomegaly and pulmonary edema,   Patient with a history of diabetes and vision loss from diabetes, was verbally educated on the sign and symptoms of heart failure, daily weights, when to call his doctor or go to the ED. Detailed diet/ fluid educations on the restrictions, and salt. Patient reported that friends and family usually bring him food, most of the time its canned food or fast food, he reported to drinking Pepsi daily and flavored waters. Education done on taking all medications as prescribed, he reported that he will have his daughter help with these moving forward, and also driving him to all appointments. Patient verbalized his understanding of education, he is scheduled for a HF TOC appointment at [facility] on 11/21/2022 @ 11 :30, per patient request. He lives about 5 minutes fromt here and his daughter can take him.   ECHO/ LVEF: 40-45 % G3DD  Clinical Course:  Past Medical History:  Diagnosis Date   Back injury 02/2002   worker's comp   CHF (congestive heart failure) (HCC)    Coronary artery disease, non-occlusive    a. cath 2002 with no sig CAD;  b. cath 2008 normal LM, LAD, LCx, p&dRCA 20-30%, PDA 30%; c.11/2015 NSTEMI/PCI: LM nl, LAD 95p (2.5x15 Xience DES), LCX nl, RCA 100p/m w/ L->R collats, EF 55-65% c. NSTEMI (02/2016) with no culprit leision, switched to Brilinta.  d. NSTEMI 03/2016: again, no culprit lesion and switched back to plavix 2/2 SOB with Brilnta.     Depression    Diabetes  mellitus type 2, insulin dependent ([place])    Hyperlipemia    Hypertensive heart disease    Kidney stones    Morbid obesity (HCC)    Osteoarthritis    Snoring      Social History   Socioeconomic History   Marital status: Widowed    Spouse name: Not on file   Number of children: Not on file   Years of education: Not on file   Highest education level: Not on file  Occupational History   Occupation: disabled    Employer: UNEMPLOYED    Comment: back injury  Tobacco Use   Smoking status: Never   Smokeless tobacco: Never  Vaping Use   Vaping Use: Never used  Substance and Sexual Activity   Alcohol use: No    Alcohol/week: 0.0 standard drinks of alcohol   Drug use: No   Sexual activity: Not Currently  Other Topics Concern   Not on file  Social History Narrative   Financial concerns, wife with depression.   Minimal exercise.   Diet: poor.   Social Determinants of Health   Financial Resource Strain: Low Risk  (11/11/2021)   Overall Financial Resource Strain (CARDIA)    Difficulty of Paying Living Expenses: Not very hard  Food Insecurity: No Food Insecurity (10/24/2022)   Hunger Vital Sign    Worried About Running Out of Food in the Last Year: Never true    Ran Out of Food in the Last Year: Never true  Transportation Needs: No Transportation Needs (  10/24/2022)   PRAPARE - Hydrologist (Medical): No    Lack of Transportation (Non-Medical): No  Physical Activity: Inactive (11/11/2021)   Exercise Vital Sign    Days of Exercise per Week: 0 days    Minutes of Exercise per Session: 0 min  Stress: No Stress Concern Present (11/11/2021)   [place]    Feeling of Stress : Only a little  Social Connections: Moderately Isolated (11/11/2021)   Social Connection and Isolation Panel [NHANES]    Frequency of Communication with Friends and Family: More than three times a week    Frequency of  Social Gatherings with Friends and Family: More than three times a week    Attends Religious Services: More than 4 times per year    Active Member of [employer] or Organizations: No    Attends Archivist Meetings: Never    Marital Status: Widowed   Education Assessment and Provision:  Detailed education and instructions provided on heart failure disease management including the following:  Signs and symptoms of Heart Failure When to call the physician Importance of daily weights Low sodium diet Fluid restriction Medication management Anticipated future follow-up appointments  Patient education given on each of the above topics.  Patient acknowledges understanding via teach back method and acceptance of all instructions.  Education Materials:  "Living Better With Heart Failure" Booklet, HF zone tool, & Daily Weight Tracker Tool.  Patient has scale at home: yes Patient has pill box at home: NA     High Risk Criteria for Readmission and/or Poor Patient Outcomes: Heart failure hospital admissions (last 6 months): 0  No Show rate: 5% Difficult social situation: No Demonstrates medication adherence: No Primary Language: English Literacy level: has a 7 th grade education, can read, write and comprehend.  Barriers of Care:   Medication compliance Diet/ fluid restrictions ( can foods, pepsi)  Daily weights Continued HF education  Considerations/Referrals:   Referral made to Heart Failure Pharmacist Stewardship: Yes Referral made to Heart Failure CSW/NCM TOC: Yes, paramedicine ? [facility]  Referral made to Heart & Vascular TOC clinic: yes, [facility] 11/21/2022 @ 11 :30 am  Items for Follow-up on DC/TOC: Medication compliance Diet/ fluid restrictions ( can foods, pepsi) Daily weights Continued HF education ( especially diet/ fluids)   Earnestine Leys, BSN, RN Heart Failure Transport planner Only

## 2022-11-07 NOTE — Progress Notes (Signed)
Mobility Specialist Progress Note:   11/07/22 1021  Mobility  Activity Ambulated with assistance in hallway  Level of Assistance Standby assist, set-up cues, supervision of patient - no hands on  Assistive Device Front wheel walker  Distance Ambulated (ft) 160 ft  Activity Response Tolerated well  $Mobility charge 1 Mobility   Pt received EOB willing to participate in mobility. No complaints of pain. Left EOB with call bell in reach and all needs met.  Gareth Eagle Kebra [place]rimore Mobility Specialist Please contact via [employer] or  Rehab Office at [phone]

## 2022-11-07 NOTE — Progress Notes (Signed)
PROGRESS NOTE  [name]  DOB: 12-Sep-1950  PCP: Jinny Sanders, MD QE:[id number]  DOA: 11/05/2022  LOS: 2 days  Hospital Day: 3  Brief narrative: [name] is a 72 y.o. male with PMH significant for morbid obesity, DM2, HTN, HLD, CAD/NSTEMI 0000000, chronic diastolic CHF, osteoarthritis, back injury, nephrolithiasis. 3/9, patient presented to the ED with complaint of nausea, vomiting and dyspnea for the last 3 days. 3/6, patient was prescribed Ozempic by PCP to help aid weight loss and better diabetes control. Upon using the first dose, patient felt sick to his stomach and had about 5 episodes of nausea and vomiting.  He states he subsequently developed dyspnea, chest pain that worsened over the next few days.  He reports compliance with his cardiac medications including diuretics and antiplatelet agents.   With worsening symptoms, presented to the ED on 3/9.  In the ED, patient had a fever of 102.4 with tachycardia, tachypnea, blood pressure maintained WBC count 6.8, creatinine 1.12, lactate elevated at 2.5 troponin 1268->1880.  BNP elevated at 1240.   EKG showed sinus tachycardia with repolarization abnormality in lateral leads, QTc 409 ms.   Respiratory virus panel positive for COVID D-dimer was elevated as well CTPA was negative for pulm embolism but showed findings of CHF Blood culture sent. Initially, given sepsis parameters, patient was started on IV fluid.  But with the possibility of CHF noted in imaging, IVF was stopped and patient was given 1 dose of IV Lasix Cardiology was consulted and per recommendation started on heparin drip Admitted to [facility]  Subjective: Patient was seen and examined this morning.   Sitting up in bed.  Not in distress.  Not on supplemental oxygen today.  Remains on heparin drip.   Ambulating without supplemental oxygen today. No recurrence of chest pain.  Assessment and plan: Sepsis POA COVID-19 infection  Present with fever, tachycardia,  tachypnea, elevated lactic acid and COVID PCR positive  No pneumonia and imaging.  Procalcitonin not elevated. On admission, patient was started on remdesivir, Decadron and bronchodilators. Improving respiratory status. Continue antitussives as needed, incentive spirometry WBC count and inflammatory markers trend as below. Recent Labs  Lab 11/05/22 0824 11/05/22 0839 11/05/22 0850 11/05/22 1055 11/05/22 2016 11/06/22 0112 11/07/22 0059  SARSCOV2NAA  --   --  POSITIVE*  --   --   --   --   WBC 6.8  --   --   --   --  3.9* 6.0  LATICACIDVEN  --  2.5*  --  1.9  --   --   --   PROCALCITON  --   --   --   --  <0.10  --   --   DDIMER 1.56*  --   --   --   --  0.82* 0.49  FERRITIN  --   --   --   --   --  97 109  LDH  --   --   --   --  227*  --   --   CRP  --   --   --   --  6.6*  --   --   ALT 16  --   --   --   --   --   --    NSTEMI History of CAD, HLD Troponin significantly elevated, hard to attribute to demand ischemia only. Currently on heparin drip per recommendation by cardiology. PTA on Coreg 6.25 mg daily (??  Not BID), aspirin  81 mg daily, Plavix 75 mg daily, Zetia 10 mg daily (intolerant to statin) Currently continued on all. Remains on heparin drip per cardiology.  Continue to decide tomorrow if patient needs any further inpatient care Recent Labs    11/05/22 0824 11/05/22 1055  TROPONINIHS 1,268* 1,880*    Acute exacerbation of chronic diastolic CHF  Chronic bilateral lower extremity edema Essential hypertension BNP elevated.  CT chest with findings of CHF. Given IV Lasix 40 mg dose in the ED. Most recent echo from 2021 with EF 60 to 65%, mild LVH, grade 1 diastolic dysfunction PTA on Coreg 6.25 mg daily, Lasix 40 mg daily, Imdur 30 mg twice daily, losartan 50 mg daily Continue all.  Aldactone 25 mg daily was added by cardiology on admission. Net IO Since Admission: -924.6 mL [11/07/22 1459] Continue to monitor for daily intake output, weight, blood pressure,  BNP, renal function and electrolytes. Recent Labs  Lab 11/05/22 0824 11/06/22 0112 11/07/22 0059  BNP 1,240.5*  --   --   BUN 20 21 30*  CREATININE 1.12 1.03 1.09  K 3.8 3.7 3.5  MG 1.9  --   --    Acute hypoxic respiratory failure Secondary to combination of COVID and CHF flareup.   Initially required supplemental oxygen.  Gradually weaned off.  Able to ambulate on room air  Nausea/vomiting Patient reports her nausea/vomiting started after she was initiated on Ozempic.  Being a GLP-1 agonist, Ozempic delayed gastric emptying and hence could have led to the symptoms.   Symptom improved.  Able to tolerate diet. Currently on IV Protonix 40 mg daily, IV Zofran as needed  Type 2 diabetes mellitus A1c 7.1 in December 2023 Was recently switched from [place] 50 units daily to [place] Currently ordered for Semglee 35 units nightly, NovoLog Premeal schedule III times 3 times daily and sliding scale insulin Blood sugar may run further elevated because of use of Decadron. Given intolerance to Ozempic, plan to resume [place] at discharge Continue sliding scale insulin with Accu-Cheks. Recent Labs  Lab 11/06/22 1208 11/06/22 1852 11/06/22 2116 11/07/22 0558 11/07/22 1148  GLUCAP 294* 366* 265* 103* 187*   Vitamin B12 deficiency Continue vitamin B12. Recent Labs    02/01/22 0752 11/05/22 0824 11/05/22 0824 11/06/22 0112 11/07/22 0059  HGB  --  11.1*  --  10.3* 10.6*  MCV  --  83.6   < > 83.6 82.6  VITAMINB12 355  --   --   --   --   FERRITIN  --   --   --  97 109   < > = values in this interval not displayed.   Depression/anxiety Continue venlafaxine  Chronic back pain Osteoarthritis Norco as needed   Mobility: Encourage ambulation.  PT eval ordered  Goals of care   Code Status: Full Code     DVT prophylaxis: Heparin drip    Antimicrobials: Antiviral for COVID Fluid: Not on fluid Consultants: Cardiology Family Communication: None at bedside  Status is:  Inpatient Level of care: Progressive   Dispo: Patient is from: Home              Anticipated d/c is to: Home hopefully tomorrow if no further inpatient cardiology care required Continue in-hospital care because: Pending clinical course   Scheduled Meds:  aspirin EC  81 mg Oral Daily   carvedilol  6.25 mg Oral BID WC   cholecalciferol  2,000 Units Oral Daily   clopidogrel  75 mg Oral Q breakfast  cyanocobalamin  1,000 mcg Oral Daily   dexamethasone (DECADRON) injection  6 mg Intravenous Q24H   empagliflozin  10 mg Oral Daily   ezetimibe  10 mg Oral Daily   furosemide  40 mg Intravenous BID   insulin aspart  0-15 Units Subcutaneous TID WC   insulin aspart  4 Units Subcutaneous TID WC   insulin glargine-yfgn  35 Units Subcutaneous QHS   isosorbide mononitrate  30 mg Oral BID   losartan  50 mg Oral Daily   pantoprazole (PROTONIX) IV  40 mg Intravenous Q24H   sodium chloride flush  3 mL Intravenous Q12H   spironolactone  25 mg Oral Daily   venlafaxine XR  150 mg Oral Q breakfast   venlafaxine XR  75 mg Oral Q breakfast    PRN meds: sodium chloride, acetaminophen, HYDROcodone-acetaminophen, hydrOXYzine, ipratropium-albuterol, melatonin, nitroGLYCERIN, ondansetron (ZOFRAN) IV, sodium chloride flush   Infusions:   sodium chloride      Diet:  Diet Order             Diet heart healthy/carb modified Room service appropriate? Yes; Fluid consistency: Thin  Diet effective now                   Antimicrobials: Anti-infectives (From admission, onward)    Start     Dose/Rate Route Frequency Ordered Stop   11/06/22 1000  remdesivir 100 mg in sodium chloride 0.9 % 100 mL IVPB       See Hyperspace for full Linked Orders Report.   100 mg 200 mL/hr over 30 Minutes Intravenous Daily 11/05/22 1358 11/07/22 1028   11/05/22 2300  vancomycin (VANCOREADY) IVPB 750 mg/150 mL  Status:  Discontinued        750 mg 150 mL/hr over 60 Minutes Intravenous Every 12 hours 11/05/22 1006  11/05/22 1350   11/05/22 1700  ceFEPIme (MAXIPIME) 2 g in sodium chloride 0.9 % 100 mL IVPB  Status:  Discontinued        2 g 200 mL/hr over 30 Minutes Intravenous Every 8 hours 11/05/22 1006 11/05/22 2103   11/05/22 1400  remdesivir 200 mg in sodium chloride 0.9% 250 mL IVPB       See Hyperspace for full Linked Orders Report.   200 mg 580 mL/hr over 30 Minutes Intravenous Once 11/05/22 1358 11/05/22 1741   11/05/22 0900  vancomycin (VANCOREADY) IVPB 2000 mg/400 mL        2,000 mg 200 mL/hr over 120 Minutes Intravenous  Once 11/05/22 0852 11/05/22 1329   11/05/22 0845  ceFEPIme (MAXIPIME) 2 g in sodium chloride 0.9 % 100 mL IVPB        2 g 200 mL/hr over 30 Minutes Intravenous  Once 11/05/22 0839 11/05/22 0959   11/05/22 0845  metroNIDAZOLE (FLAGYL) IVPB 500 mg        500 mg 100 mL/hr over 60 Minutes Intravenous  Once 11/05/22 0839 11/05/22 1107   11/05/22 0845  vancomycin (VANCOCIN) IVPB 1000 mg/200 mL premix  Status:  Discontinued        1,000 mg 200 mL/hr over 60 Minutes Intravenous  Once 11/05/22 0839 11/05/22 0852       Skin assessment:       Nutritional status:  Body mass index is 41.82 kg/m.          Objective: Vitals:   11/07/22 0800 11/07/22 1151  BP: (!) 99/46 136/74  Pulse: 66 68  Resp: 17 15  Temp: 97.6 F (36.4 C) 97.8 F (36.6  C)  SpO2: 100% 97%    Intake/Output Summary (Last 24 hours) at 11/07/2022 1459 Last data filed at 11/07/2022 1152 Gross per 24 hour  Intake 215.23 ml  Output 1650 ml  Net -1434.77 ml   Filed Weights   11/05/22 0831 11/05/22 1840 11/07/22 0522  Weight: 114.3 kg 114.8 kg 117.5 kg   Weight change: 3.221 kg Body mass index is 41.82 kg/m.   Physical Exam: General exam: Pleasant, elderly.  Sitting up at the edge of the bed.  Not in pain Skin: No rashes, lesions or ulcers. HEENT: Atraumatic, normocephalic, no obvious bleeding Lungs: Clear to auscultation bilaterally CVS: Regular rate and rhythm, no murmur GI/Abd soft,  nontender, nondistended, bowel sound present CNS: Alert, awake, oriented x 3 Psychiatry: Mood appropriate. Extremities: Trace bilateral pedal edema with chronic stasis changes  Data Review: I have personally reviewed the laboratory data and studies available.  F/u labs ordered Unresulted Labs (From admission, onward)     Start     Ordered   11/06/22 0500  CBC  Daily,   R     See Hyperspace for full Linked Orders Report.   11/05/22 1134   11/06/22 [id number]  Basic metabolic panel  Daily,   R     Comments: As Scheduled for 5 days    11/05/22 1350            Total time spent in review of labs and imaging, patient evaluation, formulation of plan, documentation and communication with family: 73 minutes  Signed, Terrilee Croak, MD [facility] 11/07/2022

## 2022-11-07 NOTE — Progress Notes (Signed)
ANTICOAGULATION CONSULT NOTE - follow-up  Pharmacy Consult for heparin  Indication: chest pain/ACS  Allergies  Allergen Reactions   Bupropion Nausea Only   Ozempic (0.25 Or 0.5 Mg-Dose) [Semaglutide(0.25 Or 0.'5mg'$ -Dos)] Nausea And Vomiting   Atorvastatin Other (See Comments)    Body aches Similar effect with rosuvastatin 40 mg twice weekly    Patient Measurements: Height: '5\' 6"'$  (167.6 cm) Weight: 117.5 kg (259 lb 1.6 oz) IBW/kg (Calculated) : 63.8 Heparin Dosing Weight: 90.1  Vital Signs: Temp: 98 F (36.7 C) (03/11 0412) Temp Source: Oral (03/11 0412) BP: 96/57 (03/11 0412) Pulse Rate: 67 (03/11 0412)  Labs: Recent Labs    11/05/22 0824 11/05/22 0839 11/05/22 1055 11/05/22 2016 11/06/22 0112 11/06/22 1404 11/07/22 0059  HGB 11.1*  --   --   --  10.3*  --  10.6*  HCT 35.6*  --   --   --  32.6*  --  33.8*  PLT 202  --   --   --  198  --  245  APTT  --  32  --   --   --   --   --   LABPROT  --  14.9  --   --   --   --   --   INR  --  1.2  --   --   --   --   --   HEPARINUNFRC  --   --   --    < > 0.43 0.40 0.37  CREATININE 1.12  --   --   --  1.03  --  1.09  TROPONINIHS 1,268*  --  1,880*  --   --   --   --    < > = values in this interval not displayed.    Estimated Creatinine Clearance: 73.9 mL/min (by C-G formula based on SCr of 1.09 mg/dL).   Medical History: Past Medical History:  Diagnosis Date   Back injury 02/2002   worker's comp   CHF (congestive heart failure) ([place])    Coronary artery disease, non-occlusive    a. cath 2002 with no sig CAD;  b. cath 2008 normal LM, LAD, LCx, p&dRCA 20-30%, PDA 30%; c.11/2015 NSTEMI/PCI: LM nl, LAD 95p (2.5x15 Xience DES), LCX nl, RCA 100p/m w/ L->R collats, EF 55-65% c. NSTEMI (02/2016) with no culprit leision, switched to Brilinta.  d. NSTEMI 03/2016: again, no culprit lesion and switched back to plavix 2/2 SOB with Brilnta.     Depression    Diabetes mellitus type 2, insulin dependent ([place])    Hyperlipemia     Hypertensive heart disease    Kidney stones    Morbid obesity ([name])    Osteoarthritis    Snoring     Assessment: Patient admitted for concern of sepsis, trop elevated to 1268. Differential includes demand ischemia vs. Myocarditis. Patient not on anticoag PTA, however on DAPT with ASA/Plavix. Cardiology to be consulted. Pharmacy consulted to dose heparin. Duration 48 hours per Cardiology - ends today at 1200 PM.   Heparin level today remains therapeutic on 1200 units/hr. CBC is stable. No line issues or signs/symptoms of bleeding reported.   Goal of Therapy:  Heparin level 0.3-0.7 units/ml Monitor platelets by anticoagulation protocol: Yes   Plan:  Continue heparin gtt '@1200'$  units/hr - ends at 1200 PM today (total 48 hours per Cardiology).  Pharmacy will sign off consult. Please re-consult as needed.    Sloan Leiter, PharmD, BCPS, BCCCP Clinical Pharmacist Please refer to [facility] for  [place] [facility] numbers 3/11/20247:24 AM

## 2022-11-07 NOTE — Progress Notes (Signed)
Inpatient Diabetes Program Recommendations  AACE/ADA: New Consensus Statement on Inpatient Glycemic Control (2015)  Target Ranges:  Prepandial:   less than 140 mg/dL      Peak postprandial:   less than 180 mg/dL (1-2 hours)      Critically ill patients:  140 - 180 mg/dL   Lab Results  Component Value Date   GLUCAP 103 (H) 11/07/2022   HGBA1C 7.1 (A) 08/18/2022    Review of Glycemic Control  Latest Reference Range & Units 11/05/22 16:41 11/05/22 21:50 11/06/22 06:19 11/06/22 12:08 11/06/22 18:52 11/06/22 21:16 11/07/22 05:58  Glucose-Capillary 70 - 99 mg/dL 211 (H) 294 (H) 249 (H) 294 (H) 366 (H) 265 (H) 103 (H)   Diabetes history: DM 2 Outpatient Diabetes medications:  Ozempic (has only taken one dose) Tresiba 55 units q AM Novolog 10-11 units in the mornings and 5 units in the evening (takes prn) Current orders for Inpatient glycemic control:  Novolog 0-15 units tid with meals Novolog 4 units tid with meals Semglee 35 units q HS Decadron 6 mg q 24 hours Inpatient Diabetes Program Recommendations:    Spoke with patient by phone to confirm home doses of insulin.  It appears he was recently switched from Trulicity to [facility] by PCP.  Patient states he had only taken one dose of Ozempic and had nausea/vomiting.  He was then admitted for COVID/Non-stemi.   Last A1C was close to goal.  Recommended f/u with PCP regarding GLP-1 restart.  At this point, recommend d/c on insulin only.  Patient states that he has appointment with PCP on March 21.    May consider increasing Novolog meal coverage to 6 units tid with meals and increase Semglee to 40 units q HS.    Thanks,  Adah Perl, RN, BC-ADM Inpatient Diabetes Coordinator Pager [phone]  (8a-5p)

## 2022-11-07 NOTE — Progress Notes (Addendum)
   Heart Failure Stewardship Pharmacist Progress Note   PCP: Jinny Sanders, MD PCP-Cardiologist: None    HPI:  72 yo M with PMH of CAD, CHF, T2DM, HTN, HLD, obesity, anemia, and depression.   Presented to the ED on 3/9 with worsening shortness of breath, orthopnea, fevers, chills, N/V following first dose of Ozempic, productive cough, and fatigue. BNP elevated. CXR showed chronic cardiomegaly and pulmonary edema. CTA negative for PE. Found to be COVID+ on admission. ECHO done 3/10 showed LVEF reduced to 40-45% (was 60-65% in 2021), global hypokinesis, G3DD, RV normal, mild-moderate MR.   Current HF Medications: Diuretic: furosemide 40 mg IV BID Beta Blocker: carvedilol 6.25 mg BID ACE/ARB/ARNI: losartan 50 mg daily MRA: spironolactone 25 mg daily  Prior to admission HF Medications: Diuretic: furosemide 20 mg daily Beta blocker: carvedilol 6.25 mg daily ACE/ARB/ARNI: losartan 50 mg daily  Pertinent Lab Values: Serum creatinine 1.09, BUN 30, Potassium 3.5, Sodium 139, BNP 1240.5, Magnesium 1.9, A1c 7.1   Vital Signs: Weight: 259 lbs  Blood pressure: 90/50s  Heart rate: 60-70s  I/O: -0.6L yesterday; net -0.1L  Medication Assistance / Insurance Benefits Check: Does the patient have prescription insurance?  Yes Type of insurance plan: [employer]  Outpatient Pharmacy:  Prior to admission outpatient pharmacy: [place] Is the patient willing to use [facility] at discharge? Yes Is the patient willing to transition their outpatient pharmacy to utilize a [facility] outpatient pharmacy?   No - prefers delivery service from [place]: 1. Acute on chronic systolic and diastolic CHF (LVEF 17-00%), plan outpatient ischemic evaluation per cardiology if no angina. NYHA class I-II symptoms. - Continue furosemide 40 mg IV BID. Strict I/Os and daily weights. Keep K>4 and Mg>2. - Continue carvedilol 6.25 mg BID - Continue losartan 50 mg  daily, trend BP for transition to Entresto  - Continue spironolactone 25 mg daily - Consider starting SGLT2i today   Plan: 1) Medication changes recommended at this time: - Start Jardiance 10 mg daily  2) Patient assistance: - Jardiance/Farxiga copay $0 - Entresto copay $0 - Educated about pill packing services from pharmacy or family - Educated about BID dosing of carvedilol  3)  Education  - Patient has been educated on current HF medications and potential additions to HF medication regimen - Patient verbalizes understanding that over the next few months, these medication doses may change and more medications may be added to optimize HF regimen - Patient has been educated on basic disease state pathophysiology and goals of therapy   Kerby Nora, PharmD, BCPS Heart Failure Cytogeneticist Phone [phone]

## 2022-11-08 ENCOUNTER — Other Ambulatory Visit (HOSPITAL_COMMUNITY): Payer: Self-pay

## 2022-11-08 DIAGNOSIS — I5041 Acute combined systolic (congestive) and diastolic (congestive) heart failure: Secondary | ICD-10-CM

## 2022-11-08 LAB — BASIC METABOLIC PANEL
Anion gap: 10 (ref 5–15)
BUN: 34 mg/dL — ABNORMAL HIGH (ref 8–23)
CO2: 27 mmol/L (ref 22–32)
Calcium: 8.4 mg/dL — ABNORMAL LOW (ref 8.9–10.3)
Chloride: 102 mmol/L (ref 98–111)
Creatinine, Ser: 1.06 mg/dL (ref 0.61–1.24)
GFR, Estimated: >60 mL/min (ref >60)
Glucose, Bld: 100 mg/dL — ABNORMAL HIGH (ref 70–99)
Potassium: 3.3 mmol/L — ABNORMAL LOW (ref 3.5–5.1)
Sodium: 139 mmol/L (ref 135–145)

## 2022-11-08 LAB — CBC
HCT: 35.3 % — ABNORMAL LOW (ref 39.0–52.0)
Hemoglobin: 10.8 g/dL — ABNORMAL LOW (ref 13.0–17.0)
MCH: 25.6 pg — ABNORMAL LOW (ref 26.0–34.0)
MCHC: 30.6 g/dL (ref 30.0–36.0)
MCV: 83.6 fL (ref 80.0–100.0)
Platelets: 231 10*3/uL (ref 150–400)
RBC: 4.22 MIL/uL (ref 4.22–5.81)
RDW: 17.8 % — ABNORMAL HIGH (ref 11.5–15.5)
WBC: 6.2 10*3/uL (ref 4.0–10.5)
nRBC: 0 % (ref 0.0–0.2)

## 2022-11-08 LAB — GLUCOSE, CAPILLARY
Glucose-Capillary: 77 mg/dL (ref 70–99)
Glucose-Capillary: 151 mg/dL — ABNORMAL HIGH (ref 70–99)

## 2022-11-08 MED ORDER — POTASSIUM CHLORIDE CRYS ER 20 MEQ PO TBCR: 20.0000 meq | EXTENDED_RELEASE_TABLET | Freq: Every day | ORAL | Status: DC

## 2022-11-08 MED ORDER — FUROSEMIDE 40 MG PO TABS: 40.0000 mg | ORAL_TABLET | Freq: Every day | ORAL | Status: DC

## 2022-11-08 MED ORDER — SPIRONOLACTONE 25 MG PO TABS
25.0000 mg | ORAL_TABLET | Freq: Every day | ORAL | 2 refills | Status: DC
  Filled 2022-11-08: qty 30, 30d supply, fill #0

## 2022-11-08 MED ORDER — GUAIFENESIN-DM 100-10 MG/5ML PO SYRP
5.0000 mL | ORAL_SOLUTION | ORAL | 0 refills | Status: DC | PRN
  Filled 2022-11-08: qty 118, 4d supply, fill #0

## 2022-11-08 MED ORDER — ISOSORBIDE MONONITRATE ER 30 MG PO TB24
30.0000 mg | ORAL_TABLET | Freq: Two times a day (BID) | ORAL | 2 refills | Status: DC
  Filled 2022-11-08: qty 60, 30d supply, fill #0

## 2022-11-08 MED ORDER — CARVEDILOL 6.25 MG PO TABS
6.2500 mg | ORAL_TABLET | Freq: Two times a day (BID) | ORAL | 2 refills | Status: DC
  Filled 2022-11-08: qty 60, 30d supply, fill #0

## 2022-11-08 MED ORDER — FUROSEMIDE 40 MG PO TABS
40.0000 mg | ORAL_TABLET | Freq: Every day | ORAL | 2 refills | Status: DC
  Filled 2022-11-08: qty 30, 30d supply, fill #0

## 2022-11-08 MED ORDER — PANTOPRAZOLE SODIUM 40 MG PO TBEC: 40.0000 mg | DELAYED_RELEASE_TABLET | Freq: Every day | ORAL | Status: DC

## 2022-11-08 MED ORDER — EMPAGLIFLOZIN 10 MG PO TABS
10.0000 mg | ORAL_TABLET | Freq: Every day | ORAL | 2 refills | Status: DC
  Filled 2022-11-08: qty 30, 30d supply, fill #0

## 2022-11-08 MED ORDER — LOSARTAN POTASSIUM 50 MG PO TABS
50.0000 mg | ORAL_TABLET | Freq: Every day | ORAL | 2 refills | Status: DC
  Filled 2022-11-08: qty 30, 30d supply, fill #0

## 2022-11-08 NOTE — Progress Notes (Signed)
   Heart Failure Stewardship Pharmacist Progress Note   PCP: Jinny Sanders, MD PCP-Cardiologist: None    HPI:  72 yo M with PMH of CAD, CHF, T2DM, HTN, HLD, obesity, anemia, and depression.   Presented to the ED on 3/9 with worsening shortness of breath, orthopnea, fevers, chills, N/V following first dose of Ozempic, productive cough, and fatigue. BNP elevated. CXR showed chronic cardiomegaly and pulmonary edema. CTA negative for PE. Found to be COVID+ on admission. ECHO done 3/10 showed LVEF reduced to 40-45% (was 60-65% in 2021), global hypokinesis, G3DD, RV normal, mild-moderate MR.   Discharge HF Medications: Diuretic: furosemide 40 mg PO daily Beta Blocker: carvedilol 6.25 mg BID ACE/ARB/ARNI: losartan 50 mg daily MRA: spironolactone 25 mg daily SGLT2i: Jardiance 10 mg daily  Prior to admission HF Medications: Diuretic: furosemide 20 mg daily Beta blocker: carvedilol 6.25 mg daily ACE/ARB/ARNI: losartan 50 mg daily  Pertinent Lab Values: Serum creatinine 1.06, BUN 34, Potassium 3.3, Sodium 139, BNP 1240.5, Magnesium 1.9, A1c 7.1   Vital Signs: Weight: 259>253 lbs  Blood pressure: 110-130/50s  Heart rate: 60-70s  I/O: -2.7L yesterday; net -2.5L  Medication Assistance / Insurance Benefits Check: Does the patient have prescription insurance?  Yes Type of insurance plan: [employer] Medicare  Outpatient Pharmacy:  Prior to admission outpatient pharmacy: [place] Is the patient willing to use [facility] at discharge? Yes Is the patient willing to transition their outpatient pharmacy to utilize a [facility] outpatient pharmacy?   No - prefers delivery service from [place]: 1. Acute on chronic systolic and diastolic CHF (LVEF 45-62%), plan outpatient ischemic evaluation per cardiology if no angina. NYHA class I-II symptoms. - Agree with furosemide 40 mg PO daily at discharge. Strict I/Os and daily weights. Keep K>4 and Mg>2. -  Continue carvedilol 6.25 mg BID - Continue losartan 50 mg daily, trend BP for transition to Entresto  - Continue spironolactone 25 mg daily - Continue Jardiance 10 mg daily  Plan: 1) Medication changes recommended at this time: - Discharge today  2) Patient assistance: - Jardiance/Farxiga copay $0 - Entresto copay $0 - Educated about pill packing services from pharmacy or family - Educated about BID dosing of carvedilol  3)  Education  - Patient has been educated on current HF medications and potential additions to HF medication regimen - Patient verbalizes understanding that over the next few months, these medication doses may change and more medications may be added to optimize HF regimen - Patient has been educated on basic disease state pathophysiology and goals of therapy   Kerby Nora, PharmD, BCPS Heart Failure Cytogeneticist Phone [phone]

## 2022-11-08 NOTE — Plan of Care (Signed)
DISCHARGE NOTE HOME [name] to be discharged home per MD order. Discussed prescriptions and follow up appointments with the patient. Prescriptions given to patient; medication list explained in detail. Patient verbalized understanding.  Skin clean, dry and intact without evidence of skin break down, no evidence of skin tears noted. IV catheter discontinued intact. Site without signs and symptoms of complications. Dressing and pressure applied. Pt denies pain at the site currently. No complaints noted.  Patient free of lines, drains, and wounds.   An After Visit Summary (AVS) was printed and given to the patient. Patient to be escorted via wheelchair, and discharged home via private auto.  Stephan Minister, RN

## 2022-11-08 NOTE — Progress Notes (Signed)
Rounding Note    Patient Name: [name] Date of Encounter: 11/08/2022  [place] Cardiologist:  Rockey Situ   Subjective   Morbidly obese, elderly male  Admitted with volume overload and COVID-19 infection.  Had positive cardiac enzymes.  Plan is for 48 hours of medical therapy.  Echo from March 10 , 2024 reveals global hypokinesis with EF 40-45%.   Grade II DD  Mild - mod MR   He has diuresed 2.5 L so far during this hospitalization.  Inpatient Medications    Scheduled Meds:  aspirin EC  81 mg Oral Daily   carvedilol  6.25 mg Oral BID WC   cholecalciferol  2,000 Units Oral Daily   clopidogrel  75 mg Oral Q breakfast   cyanocobalamin  1,000 mcg Oral Daily   dexamethasone (DECADRON) injection  6 mg Intravenous Q24H   empagliflozin  10 mg Oral Daily   ezetimibe  10 mg Oral Daily   furosemide  40 mg Intravenous BID   insulin aspart  0-15 Units Subcutaneous TID WC   insulin aspart  4 Units Subcutaneous TID WC   insulin glargine-yfgn  35 Units Subcutaneous QHS   isosorbide mononitrate  30 mg Oral BID   losartan  50 mg Oral Daily   pantoprazole (PROTONIX) IV  40 mg Intravenous Q24H   sodium chloride flush  3 mL Intravenous Q12H   spironolactone  25 mg Oral Daily   venlafaxine XR  150 mg Oral Q breakfast   venlafaxine XR  75 mg Oral Q breakfast   Continuous Infusions:  sodium chloride     PRN Meds: sodium chloride, acetaminophen, HYDROcodone-acetaminophen, hydrOXYzine, ipratropium-albuterol, melatonin, nitroGLYCERIN, ondansetron (ZOFRAN) IV, sodium chloride flush   Vital Signs    Vitals:   11/07/22 1948 11/07/22 2340 11/08/22 0615 11/08/22 0805  BP: (!) 121/51 (!) 108/52 119/61 (!) 105/57  Pulse: 70 72 (!) 117 66  Resp: '16 16  18  '$ Temp: 97.7 F (36.5 C) 98.3 F (36.8 C) (!) 97.5 F (36.4 C) 97.8 F (36.6 C)  TempSrc: Oral Oral Oral Oral  SpO2: 98% 97% 93% 93%  Weight:   114.9 kg   Height:        Intake/Output Summary (Last 24 hours) at 11/08/2022  1018 Last data filed at 11/08/2022 0803 Gross per 24 hour  Intake 340 ml  Output 2700 ml  Net -2360 ml      11/08/2022    6:15 AM 11/07/2022    5:22 AM 11/05/2022    6:40 PM  Last 3 Weights  Weight (lbs) 253 lb 6.4 oz 259 lb 1.6 oz 253 lb  Weight (kg) 114.941 kg 117.527 kg 114.76 kg      Telemetry    NSR  - Personally Reviewed  ECG     - Personally Reviewed  Physical Exam   GEN: No acute distress.   Neck: No JVD Cardiac: RRR, no murmurs, rubs, or gallops.  Respiratory: Clear to auscultation bilaterally. GI: Soft, nontender, non-distended  MS: No edema; No deformity. Neuro:  Nonfocal  Psych: Normal affect   Labs    High Sensitivity Troponin:   Recent Labs  Lab 11/05/22 0824 11/05/22 1055  TROPONINIHS 1,268* 1,880*     Chemistry Recent Labs  Lab 11/05/22 0824 11/06/22 0112 11/07/22 0059 11/08/22 0708  NA 137 137 139 139  K 3.8 3.7 3.5 3.3*  CL 103 105 105 102  CO2 20* '22 26 27  '$ GLUCOSE 299* 274* 103* 100*  BUN 20  21 30* 34*  CREATININE 1.12 1.03 1.09 1.06  CALCIUM 8.5* 8.2* 8.5* 8.4*  MG 1.9  --   --   --   PROT 7.2  --   --   --   ALBUMIN 3.5  --   --   --   AST 31  --   --   --   ALT 16  --   --   --   ALKPHOS 73  --   --   --   BILITOT 0.8  --   --   --   GFRNONAA >60 >60 >60 >60  ANIONGAP '14 10 8 10    '$ Lipids No results for input(s): "CHOL", "TRIG", "HDL", "LABVLDL", "LDLCALC", "CHOLHDL" in the last 168 hours.  Hematology Recent Labs  Lab 11/06/22 0112 11/07/22 0059 11/08/22 0708  WBC 3.9* 6.0 6.2  RBC 3.90* 4.09* 4.22  HGB 10.3* 10.6* 10.8*  HCT 32.6* 33.8* 35.3*  MCV 83.6 82.6 83.6  MCH 26.4 25.9* 25.6*  MCHC 31.6 31.4 30.6  RDW 17.7* 17.5* 17.8*  PLT 198 245 231   Thyroid  Recent Labs  Lab 11/05/22 0825  TSH 2.218    BNP Recent Labs  Lab 11/05/22 0824  BNP 1,240.5*    DDimer  Recent Labs  Lab 11/05/22 0824 11/06/22 0112 11/07/22 0059  DDIMER 1.56* 0.82* 0.49     Radiology    ECHOCARDIOGRAM COMPLETE  Result  Date: 11/06/2022    ECHOCARDIOGRAM REPORT   Patient Name:   [name] Date of Exam: 11/06/2022 Medical Rec #:  [id number]     Height:       66.0 in Accession #:    [id number]    Weight:       253.0 lb Date of Birth:  10/20/1950     BSA:          2.210 m Patient Age:    72 years      BP:           107/47 mmHg Patient Gender: M             HR:           70 bpm. Exam Location:  Inpatient Procedure: 2D Echo, Color Doppler, Cardiac Doppler and Intracardiac            Opacification Agent Indications:    NSTEMI  History:        Patient has prior history of Echocardiogram examinations, most                 recent 04/01/2020. CHF, CAD; Risk Factors:Hypertension, Diabetes                 and Dyslipidemia.  Sonographer:    Raquel Sarna Senior RDCS Referring Phys: [id number] [place] Shi  Sonographer Comments: Technically difficult due to poor echo windows, COVID+ at time of study IMPRESSIONS  1. Left ventricular ejection fraction, by estimation, is 40 to 45%. The left ventricle has mildly decreased function. The left ventricle demonstrates global hypokinesis. Left ventricular diastolic parameters are consistent with Grade II diastolic dysfunction (pseudonormalization). Elevated left atrial pressure.  2. Right ventricular systolic function is normal. The right ventricular size is normal. Tricuspid regurgitation signal is inadequate for assessing PA pressure.  3. The mitral valve is abnormal. Mild to moderate mitral valve regurgitation. No evidence of mitral stenosis.  4. The aortic valve has an indeterminant number of cusps. There is mild calcification of the aortic valve. There is mild thickening of  the aortic valve. Aortic valve regurgitation is not visualized. No aortic stenosis is present.  5. Aortic dilatation noted. There is mild dilatation of the ascending aorta, measuring 38 mm. FINDINGS  Left Ventricle: Left ventricular ejection fraction, by estimation, is 40 to 45%. The left ventricle has mildly decreased function. The left  ventricle demonstrates global hypokinesis. Definity contrast agent was given IV to delineate the left ventricular  endocardial borders. The left ventricular internal cavity size was normal in size. There is no left ventricular hypertrophy. Left ventricular diastolic parameters are consistent with Grade II diastolic dysfunction (pseudonormalization). Elevated left atrial pressure. Right Ventricle: The right ventricular size is normal. Right vetricular wall thickness was not well visualized. Right ventricular systolic function is normal. Tricuspid regurgitation signal is inadequate for assessing PA pressure. Left Atrium: Left atrial size was normal in size. Right Atrium: Right atrial size was normal in size. Pericardium: The pericardium was not well visualized. Mitral Valve: The mitral valve is abnormal. Mild to moderate mitral valve regurgitation. No evidence of mitral valve stenosis. Tricuspid Valve: The tricuspid valve is normal in structure. Tricuspid valve regurgitation is not demonstrated. No evidence of tricuspid stenosis. Aortic Valve: The aortic valve has an indeterminant number of cusps. There is mild calcification of the aortic valve. There is mild thickening of the aortic valve. There is mild aortic valve annular calcification. Aortic valve regurgitation is not visualized. No aortic stenosis is present. Aortic valve mean gradient measures 2.3 mmHg. Aortic valve peak gradient measures 5.1 mmHg. Aortic valve area, by VTI measures 3.43 cm. Pulmonic Valve: The pulmonic valve was not well visualized. Pulmonic valve regurgitation is not visualized. No evidence of pulmonic stenosis. Aorta: The aortic root is normal in size and structure and aortic dilatation noted. There is mild dilatation of the ascending aorta, measuring 38 mm. Venous: The inferior vena cava was not well visualized. IAS/Shunts: The interatrial septum was not well visualized.  LEFT VENTRICLE PLAX 2D LVIDd:         4.50 cm   Diastology LVIDs:          3.60 cm   LV e' medial:    4.13 cm/s LV PW:         1.00 cm   LV E/e' medial:  31.0 LV IVS:        1.00 cm   LV e' lateral:   9.46 cm/s LVOT diam:     2.20 cm   LV E/e' lateral: 13.5 LV SV:         78 LV SV Index:   35 LVOT Area:     3.80 cm  RIGHT VENTRICLE RV S prime:     8.59 cm/s TAPSE (M-mode): 1.7 cm LEFT ATRIUM             Index        RIGHT ATRIUM           Index LA diam:        3.70 cm 1.67 cm/m   RA Area:     16.60 cm LA Vol (A2C):   54.4 ml 24.62 ml/m  RA Volume:   36.90 ml  16.70 ml/m LA Vol (A4C):   58.6 ml 26.52 ml/m LA Biplane Vol: 57.7 ml 26.11 ml/m  AORTIC VALVE AV Area (Vmax):    3.07 cm AV Area (Vmean):   3.66 cm AV Area (VTI):     3.43 cm AV Vmax:           112.75 cm/s AV Vmean:  68.878 cm/s AV VTI:            0.228 m AV Peak Grad:      5.1 mmHg AV Mean Grad:      2.3 mmHg LVOT Vmax:         91.00 cm/s LVOT Vmean:        66.300 cm/s LVOT VTI:          0.206 m LVOT/AV VTI ratio: 0.90  AORTA Ao Root diam: 3.30 cm Ao Asc diam:  3.80 cm MITRAL VALVE MV Area (PHT): 2.84 cm       SHUNTS MV Decel Time: 267 msec       Systemic VTI:  0.21 m MR Peak grad:    88.7 mmHg    Systemic Diam: 2.20 cm MR Mean grad:    64.0 mmHg MR Vmax:         471.00 cm/s MR Vmean:        388.0 cm/s MR PISA:         2.26 cm MR PISA Eff ROA: 18 mm MR PISA Radius:  0.60 cm MV E velocity: 128.00 cm/s MV A velocity: 37.70 cm/s MV E/A ratio:  3.40 Carlyle Dolly MD Electronically signed by Carlyle Dolly MD Signature Date/Time: 11/06/2022/2:16:49 PM    Final     Cardiac Studies      Patient Profile     72 y.o. male    Assessment & Plan     1.   NSTEMI:   likely due to demand ischemia form COVID.  Is on DAPT.  EF is mildly and globally depressed.   Would consider cath if he develops symptoms once he is over his current illness   2.  Acute combined CHF:  on Coreg, 6.25 BID, Jardiance 10 mg QD, lasix 40 QD, Losartan 50 mg a day , spironolactone 25 QD  BP is marginal .  Would change the Losartan  to Entresto once BP allows.    [place] will sign off.   Medication Recommendations:  cont current meds.  Other recommendations (labs, testing, etc):   Follow up as an outpatient:  Dr. Rockey Situ or APP in several weeks .   For questions or updates, please contact [place] Please consult [email or web address] for contact info under        Signed, Mertie Moores, MD  11/08/2022, 10:18 AM

## 2022-11-08 NOTE — TOC Transition Note (Signed)
Transition of Care (TOC) - CM/SW Discharge Note Marvetta Gibbons RN, BSN Transitions of Care Unit 4E- RN Case Manager See Treatment Team for direct phone #   Patient Details  Name: [name] MRN: [id number] Date of Birth: February 13, 1951  Transition of Care [facility]) CM/SW Contact:  Dawayne Patricia, RN Phone Number: 11/08/2022, 12:37 PM   Clinical Narrative:    Pt stable for transition home today, DME- rollator has been delivered to room on 3/11 per [employer].  No other TOC needs noted, family to transport home, pt to follow up per AVS instructions.    Final next level of care: Home/Self Care Barriers to Discharge: No Barriers Identified   Patient Goals and CMS Choice CMS Medicare.gov Compare Post Acute Care list provided to:: Patient Choice offered to / list presented to : Patient  Discharge Placement               Home          Discharge Plan and Services Additional resources added to the After Visit Summary for     Discharge Planning Services: CM Consult Post Acute Care Choice: Durable Medical Equipment          DME Arranged: Gilford Rile rolling with seat DME Agency: [employer] Date DME Agency Contacted: 11/06/22   Representative spoke with at DME Agency: Brenton Grills HH Arranged: NA Spray Agency: NA        Social Determinants of Health (SDOH) Interventions SDOH Screenings   Food Insecurity: No Food Insecurity (11/07/2022)  Housing: Low Risk  (11/07/2022)  Transportation Needs: No Transportation Needs (11/07/2022)  Alcohol Screen: Low Risk  (11/07/2022)  Depression (PHQ2-9): High Risk (10/24/2022)  Financial Resource Strain: Low Risk  (11/07/2022)  Physical Activity: Inactive (11/11/2021)  Social Connections: Moderately Isolated (11/11/2021)  Stress: No Stress Concern Present (11/11/2021)  Tobacco Use: Low Risk  (11/07/2022)     Readmission Risk Interventions    11/08/2022   12:37 PM  Readmission Risk Prevention Plan  Transportation Screening Complete  PCP  or Specialist Appt within 5-7 Days Complete  Home Care Screening Complete  Medication Review (RN CM) Complete

## 2022-11-08 NOTE — Plan of Care (Signed)

## 2022-11-08 NOTE — Discharge Summary (Addendum)
Physician Discharge Summary  [name] [id number] DOB: 12-Jun-1951 DOA: 11/05/2022  PCP: Jinny Sanders, MD  Admit date: 11/05/2022 Discharge date: 11/08/2022  Admitted From: Home Discharge disposition: Home  Recommendations at discharge:  Jardiance and Aldactone have been added to your CHF regimen Switch back from Ozempic to [place] as you were doing before. Antitussives as needed.   Brief narrative: [name] is a 72 y.o. male with PMH significant for morbid obesity, DM2, HTN, HLD, CAD/NSTEMI 0000000, chronic diastolic CHF, osteoarthritis, back injury, nephrolithiasis. 3/9, patient presented to the ED with complaint of nausea, vomiting and dyspnea for the last 3 days. 3/6, patient was prescribed Ozempic by PCP to help aid weight loss and better diabetes control. Upon using the first dose, patient felt sick to his stomach and had about 5 episodes of nausea and vomiting.  He states he subsequently developed dyspnea, chest pain that worsened over the next few days.  He reports compliance with his cardiac medications including diuretics and antiplatelet agents.   With worsening symptoms, presented to the ED on 3/9.  In the ED, patient had a fever of 102.4 with tachycardia, tachypnea, blood pressure maintained WBC count 6.8, creatinine 1.12, lactate elevated at 2.5 troponin 1268->1880.  BNP elevated at 1240.   EKG showed sinus tachycardia with repolarization abnormality in lateral leads, QTc 409 ms.   Respiratory virus panel positive for COVID D-dimer was elevated as well CTPA was negative for pulm embolism but showed findings of CHF Blood culture sent. Initially, given sepsis parameters, patient was started on IV fluid.  But with the possibility of CHF noted in imaging, IVF was stopped and patient was given 1 dose of IV Lasix Cardiology was consulted and per recommendation started on heparin drip Admitted to [facility] See below for details  Subjective: Patient was seen and  examined this morning. Lying on bed.  Not in distress.  It is his birthday today. Feels ready to go home today. States that he has [place] at home which she was using before he switched to [employer].  Hospital course: Sepsis POA COVID-19 infection  Present with fever, tachycardia, tachypnea, elevated lactic acid and COVID PCR positive  No pneumonia and imaging.  Procalcitonin not elevated. On admission, patient was started on remdesivir, Decadron and bronchodilators. Significant improvement in respiratory status.  Received remdesivir for last 3 doses. He is currently on Decadron because of initial hypoxia.  Did not have infiltrate on chest x-ray.  With continuation of Decadron, he is at high risk of fluid retention and flareup of CHF.  Hence I would not continue him on steroids at discharge. Can continue antitussives as needed, incentive spirometry WBC count and inflammatory markers trend as below. Recent Labs  Lab 11/05/22 0824 11/05/22 0839 11/05/22 0850 11/05/22 1055 11/05/22 2016 11/06/22 0112 11/07/22 0059 11/08/22 0708  SARSCOV2NAA  --   --  POSITIVE*  --   --   --   --   --   WBC 6.8  --   --   --   --  3.9* 6.0 6.2  LATICACIDVEN  --  2.5*  --  1.9  --   --   --   --   PROCALCITON  --   --   --   --  <0.10  --   --   --   DDIMER 1.56*  --   --   --   --  0.82* 0.49  --   FERRITIN  --   --   --   --   --  97 109  --   LDH  --   --   --   --  227*  --   --   --   CRP  --   --   --   --  6.6*  --   --   --   ALT 16  --   --   --   --   --   --   --    Demand ischemia History of CAD, HLD Troponin significantly elevated, and hence as per recommendation by cardiology, he was kept on heparin drip for the first 48 hours. Echocardiogram 3/10 showed global hypokinesis with EF 40 to 45% and grade 2 diastolic dysfunction. Per cardiology recommendation, patient does not need any ischemia workup at this time.  However if his symptoms recur after his current illness is over, he may need  to be considered for cardiac cath. PTA on Coreg 6.25 mg twice daily, aspirin 81 mg daily, Plavix 75 mg daily, Zetia 10 mg daily (intolerant to statin) Currently continued on all. No results for input(s): "CKTOTAL", "CKMB", "TROPONINIHS", "RELINDX" in the last 72 hours.   Acute exacerbation of chronic diastolic CHF  Chronic bilateral lower extremity edema Essential hypertension On admission BNP was elevated.  CT chest showed findings of CHF. He was given IV Lasix 40 mg dose in the ED. Echocardiogram 3/10 showed global hypokinesis with EF 40 to 45% and grade 2 diastolic dysfunction. PTA on Coreg 6.25 mg daily, Lasix 40 mg daily, Imdur 30 mg twice daily, losartan 50 mg daily.  Jardiance 10 mg daily and Aldactone 25 mg daily have been added by cardiology. At discharge, cardiology recommended to continue the above medicines.  Entresto to be considered as an outpatient.  Follows up with Dr. Adora Fridge as an outpatient Net IO Since Admission: -2,484.6 mL [11/08/22 1155] Recent Labs  Lab 11/05/22 0824 11/06/22 0112 11/07/22 0059 11/08/22 0708  BNP 1,240.5*  --   --   --   BUN 20 21 30* 34*  CREATININE 1.12 1.03 1.09 1.06  K 3.8 3.7 3.5 3.3*  MG 1.9  --   --   --    Acute hypoxic respiratory failure Secondary to combination of COVID and CHF flareup.   Initially required supplemental oxygen.  Gradually weaned off.  Currently able to ambulate on room air  Nausea/vomiting Patient reports her nausea/vomiting started after she was initiated on Ozempic.  Being a GLP-1 agonist, Ozempic delayed gastric emptying and hence could have led to the symptoms.   Symptom improved.  Able to tolerate diet.  Type 2 diabetes mellitus A1c 7.1 in December 2023 Was recently switched from [place] 50 units daily to [place] Currently ordered for Semglee 35 units nightly, NovoLog Premeal schedule III times 3 times daily and sliding scale insulin Blood sugar may run further elevated because of use of  Decadron. Given intolerance to Ozempic, plan to resume Tresiba at discharge as you were doing before Recent Labs  Lab 11/07/22 1148 11/07/22 1700 11/07/22 2130 11/08/22 0612 11/08/22 0800  GLUCAP 187* 238* 206* 77 151*   Vitamin B12 deficiency Continue vitamin B12. Recent Labs    02/01/22 0752 11/05/22 0824 11/05/22 0824 11/06/22 0112 11/07/22 0059 11/08/22 0708  HGB  --  11.1*  --  10.3* 10.6* 10.8*  MCV  --  83.6   < > 83.6 82.6 83.6  VITAMINB12 355  --   --   --   --   --   FERRITIN  --   --   --  97 109  --    < > = values in this interval not displayed.   Depression/anxiety Continue venlafaxine  Chronic back pain Osteoarthritis Norco as needed    Mobility: Encourage ambulation.  PT eval obtained.  No need of follow-up.  Goals of care   Code Status: Full Code   Wounds:  -    Discharge Exam:   Vitals:   11/07/22 1948 11/07/22 2340 11/08/22 0615 11/08/22 0805  BP: (!) 121/51 (!) 108/52 119/61 (!) 105/57  Pulse: 70 72 (!) 117 66  Resp: '16 16  18  '$ Temp: 97.7 F (36.5 C) 98.3 F (36.8 C) (!) 97.5 F (36.4 C) 97.8 F (36.6 C)  TempSrc: Oral Oral Oral Oral  SpO2: 98% 97% 93% 93%  Weight:   114.9 kg   Height:        Body mass index is 40.9 kg/m.   General exam: Pleasant, elderly.  Sitting up at the edge of the bed.  Not in pain Skin: No rashes, lesions or ulcers. HEENT: Atraumatic, normocephalic, no obvious bleeding Lungs: Clear to auscultation bilaterally CVS: Regular rate and rhythm, no murmur GI/Abd soft, nontender, nondistended, bowel sound present CNS: Alert, awake, oriented x 3 Psychiatry: Mood appropriate. Extremities: Improving bilateral pedal edema with chronic stasis changes  Follow ups:    Follow-up Information     [facility] Follow up.   Why: Rolling walker with seat to be delivered to the room Contact information: [employer] Address: [address], [place], [place] [address] Phone: [phone]         [place]. Go in 13 day(s).   Specialty: Cardiology Why: Hospital follow up 11/21/2022 @ 11 :30 am PLEASE bring a current medication list Contact information: [place] [phone]        Jinny Sanders, MD Follow up.   Specialty: Family Medicine Contact information: [place] [address] [phone]                 Discharge Instructions:   Discharge Instructions     Call MD for:  difficulty breathing, headache or visual disturbances   Complete by: As directed    Call MD for:  extreme fatigue   Complete by: As directed    Call MD for:  hives   Complete by: As directed    Call MD for:  persistant dizziness or light-headedness   Complete by: As directed    Call MD for:  persistant nausea and vomiting   Complete by: As directed    Call MD for:  severe uncontrolled pain   Complete by: As directed    Call MD for:  temperature >100.4   Complete by: As directed    Diet - low sodium heart healthy   Complete by: As directed    Diet Carb Modified   Complete by: As directed    Discharge instructions   Complete by: As directed    Recommendations at discharge:   Jardiance and Aldactone have been added to your CHF regimen  Switch back from Ozempic to [place] as you were doing before.  Antitussives as needed.  Discharge instructions for diabetes mellitus: Check blood sugar 3 times a day and bedtime at home. If blood sugar running above 200 or less than 70 please call your MD to adjust insulin. If you notice signs and symptoms of hypoglycemia (low blood sugar) like jitteriness, confusion,  thirst, tremor and sweating, please check blood sugar, drink sugary drink/biscuits/sweets to increase sugar level and call MD or return to ER.    Discharge instructions for CHF Check weight daily -preferably same time every day. Restrict fluid intake to 1200  ml daily Restrict salt intake to less than 2 g daily. Call MD if you have one of the following symptoms 1) 3 pound weight gain in 24 hours or 5 pounds in 1 week  2) swelling in the hands, feet or stomach  3) progressive shortness of breath 4) if you have to sleep on extra pillows at night in order to breathe     General discharge instructions: Follow with Primary MD Jinny Sanders, MD in 7 days  Please request your PCP  to go over your hospital tests, procedures, radiology results at the follow up. Please get your medicines reviewed and adjusted.  Your PCP may decide to repeat certain labs or tests as needed. Do not drive, operate heavy machinery, perform activities at heights, swimming or participation in water activities or provide baby sitting services if your were admitted for syncope or siezures until you have seen by Primary MD or a Neurologist and advised to do so again. [place] Controlled Substance Reporting System database was reviewed. Do not drive, operate heavy machinery, perform activities at heights, swim, participate in water activities or provide baby-sitting services while on medications for pain, sleep and mood until your outpatient physician has reevaluated you and advised to do so again.  You are strongly recommended to comply with the dose, frequency and duration of prescribed medications. Activity: As tolerated with Full fall precautions use walker/cane & assistance as needed Avoid using any recreational substances like cigarette, tobacco, alcohol, or non-prescribed drug. If you experience worsening of your admission symptoms, develop shortness of breath, life threatening emergency, suicidal or homicidal thoughts you must seek medical attention immediately by calling 911 or calling your MD immediately  if symptoms less severe. You must read complete instructions/literature along with all the possible adverse reactions/side effects for all the medicines you take and  that have been prescribed to you. Take any new medicine only after you have completely understood and accepted all the possible adverse reactions/side effects.  Wear Seat belts while driving. You were cared for by a hospitalist during your hospital stay. If you have any questions about your discharge medications or the care you received while you were in the hospital after you are discharged, you can call the unit and ask to speak with the hospitalist or the covering physician. Once you are discharged, your primary care physician will handle any further medical issues. Please note that NO REFILLS for any discharge medications will be authorized once you are discharged, as it is imperative that you return to your primary care physician (or establish a relationship with a primary care physician if you do not have one).   Increase activity slowly   Complete by: As directed        Discharge Medications:   Allergies as of 11/08/2022       Reactions   Bupropion Nausea Only   Ozempic (0.25 Or 0.5 Mg-dose) [semaglutide(0.25 Or 0.'5mg'$ -dos)] Nausea And Vomiting   Atorvastatin Other (See Comments)   Body aches Similar effect with rosuvastatin 40 mg twice weekly        Medication List     STOP taking these medications    Semaglutide (2 MG/DOSE) 8 MG/3ML Sopn       TAKE these  medications    aspirin EC 81 MG tablet Take 1 tablet (81 mg total) by mouth daily.   carvedilol 6.25 MG tablet Commonly known as: COREG Take 1 tablet (6.25 mg total) by mouth 2 (two) times daily with a meal. What changed: when to take this   cholecalciferol 25 MCG (1000 UNIT) tablet Commonly known as: VITAMIN D3 Take 2,000 Units by mouth daily.   clopidogrel 75 MG tablet Commonly known as: PLAVIX TAKE 1 TABLET BY MOUTH ONCE DAILY WITH BREAKFAST What changed: when to take this   cyanocobalamin 1000 MCG tablet Commonly known as: VITAMIN B12 Take 1,000 mcg by mouth daily.   empagliflozin 10 MG Tabs  tablet Commonly known as: JARDIANCE Take 1 tablet (10 mg total) by mouth daily. Start taking on: November 09, 2022   ezetimibe 10 MG tablet Commonly known as: ZETIA TAKE 1 TABLET BY MOUTH ONCE DAILY   FreeStyle Libre 2 Sensor Misc APPLY SENSOR EVERY 14 DAYS TO MONITOR SUGAR CONTINOUSLY   furosemide 40 MG tablet Commonly known as: LASIX Take 1 tablet (40 mg total) by mouth daily. Start taking on: November 09, 2022 What changed: See the new instructions.   guaiFENesin-dextromethorphan 100-10 MG/5ML syrup Commonly known as: ROBITUSSIN DM Take 5 mLs by mouth every 4 (four) hours as needed for cough.   HYDROcodone-acetaminophen 5-325 MG tablet Commonly known as: Norco Take 1 tablet by mouth 2 (two) times daily as needed for moderate pain. What changed: how much to take   hydrOXYzine 10 MG tablet Commonly known as: ATARAX TAKE 1 TABLET BY MOUTH 3 TIMES DAILY AS NEEDED FOR ANXIETY   isosorbide mononitrate 30 MG 24 hr tablet Commonly known as: IMDUR Take 1 tablet (30 mg total) by mouth 2 (two) times daily. What changed: when to take this   losartan 50 MG tablet Commonly known as: COZAAR Take 1 tablet (50 mg total) by mouth daily. Start taking on: November 09, 2022   nitroGLYCERIN 0.4 MG SL tablet Commonly known as: NITROSTAT DISSOLVE 1 TABLET UNDER TONGUE AS NEEDEDFOR CHEST PAIN. MAY REPEAT 5 MINUTES APART 3 TIMES IF NEEDED What changed:  how much to take how to take this when to take this reasons to take this additional instructions   NovoLOG FlexPen 100 UNIT/ML FlexPen Generic drug: insulin aspart 13 units in AM (scheduled) and 3 units PRN in evening What changed:  how much to take how to take this when to take this additional instructions   spironolactone 25 MG tablet Commonly known as: ALDACTONE Take 1 tablet (25 mg total) by mouth daily. Start taking on: November 09, 2022   Tyler Aas FlexTouch 100 UNIT/ML FlexTouch Pen Generic drug: insulin degludec INJECT 50 UNITS  INTO THE SKIN DAILY   triamcinolone cream 0.5 % Commonly known as: KENALOG Apply 1 Application topically 2 (two) times daily.   TRUEplus 5-Bevel Pen Needles 31G X 6 MM Misc Generic drug: Insulin Pen Needle USE TO INJECT INSULIN 3 TIMES A DAY   TYLENOL PO Take 3-4 tablets by mouth as needed (pain).   venlafaxine XR 150 MG 24 hr capsule Commonly known as: EFFEXOR-XR TAKE 1 CAPSULE BY MOUTH DAILY WITH BREAKFAST. TAKE WITH EFFEXOR XR '75MG'$  FORA TOTAL OF '225MG'$  What changed:  See the new instructions. Another medication with the same name was removed. Continue taking this medication, and follow the directions you see here.               Durable Medical Equipment  (From admission, onward)  Start     Ordered   11/06/22 1612  For home use only DME 4 wheeled rolling walker with seat  Once       Comments: Needs Bariatric  Question:  Patient needs a walker to treat with the following condition  Answer:  Abnormal gait   11/06/22 1612             The results of significant diagnostics from this hospitalization (including imaging, microbiology, ancillary and laboratory) are listed below for reference.    Procedures and Diagnostic Studies:   ECHOCARDIOGRAM COMPLETE  Result Date: 11/06/2022    ECHOCARDIOGRAM REPORT   Patient Name:   LANGFORD TYGART Date of Exam: 11/06/2022 Medical Rec #:  [id number]     Height:       66.0 in Accession #:    [id number]    Weight:       253.0 lb Date of Birth:  1951-07-16     BSA:          2.210 m Patient Age:    2 years      BP:           107/47 mmHg Patient Gender: M             HR:           70 bpm. Exam Location:  Inpatient Procedure: 2D Echo, Color Doppler, Cardiac Doppler and Intracardiac            Opacification Agent Indications:    NSTEMI  History:        Patient has prior history of Echocardiogram examinations, most                 recent 04/01/2020. CHF, CAD; Risk Factors:Hypertension, Diabetes                 and Dyslipidemia.   Sonographer:    Raquel Sarna Senior RDCS Referring Phys: [id number] [place] Shi  Sonographer Comments: Technically difficult due to poor echo windows, COVID+ at time of study IMPRESSIONS  1. Left ventricular ejection fraction, by estimation, is 40 to 45%. The left ventricle has mildly decreased function. The left ventricle demonstrates global hypokinesis. Left ventricular diastolic parameters are consistent with Grade II diastolic dysfunction (pseudonormalization). Elevated left atrial pressure.  2. Right ventricular systolic function is normal. The right ventricular size is normal. Tricuspid regurgitation signal is inadequate for assessing PA pressure.  3. The mitral valve is abnormal. Mild to moderate mitral valve regurgitation. No evidence of mitral stenosis.  4. The aortic valve has an indeterminant number of cusps. There is mild calcification of the aortic valve. There is mild thickening of the aortic valve. Aortic valve regurgitation is not visualized. No aortic stenosis is present.  5. Aortic dilatation noted. There is mild dilatation of the ascending aorta, measuring 38 mm. FINDINGS  Left Ventricle: Left ventricular ejection fraction, by estimation, is 40 to 45%. The left ventricle has mildly decreased function. The left ventricle demonstrates global hypokinesis. Definity contrast agent was given IV to delineate the left ventricular  endocardial borders. The left ventricular internal cavity size was normal in size. There is no left ventricular hypertrophy. Left ventricular diastolic parameters are consistent with Grade II diastolic dysfunction (pseudonormalization). Elevated left atrial pressure. Right Ventricle: The right ventricular size is normal. Right vetricular wall thickness was not well visualized. Right ventricular systolic function is normal. Tricuspid regurgitation signal is inadequate for assessing PA pressure. Left Atrium: Left atrial size was normal in size. Right Atrium: Right  atrial size was  normal in size. Pericardium: The pericardium was not well visualized. Mitral Valve: The mitral valve is abnormal. Mild to moderate mitral valve regurgitation. No evidence of mitral valve stenosis. Tricuspid Valve: The tricuspid valve is normal in structure. Tricuspid valve regurgitation is not demonstrated. No evidence of tricuspid stenosis. Aortic Valve: The aortic valve has an indeterminant number of cusps. There is mild calcification of the aortic valve. There is mild thickening of the aortic valve. There is mild aortic valve annular calcification. Aortic valve regurgitation is not visualized. No aortic stenosis is present. Aortic valve mean gradient measures 2.3 mmHg. Aortic valve peak gradient measures 5.1 mmHg. Aortic valve area, by VTI measures 3.43 cm. Pulmonic Valve: The pulmonic valve was not well visualized. Pulmonic valve regurgitation is not visualized. No evidence of pulmonic stenosis. Aorta: The aortic root is normal in size and structure and aortic dilatation noted. There is mild dilatation of the ascending aorta, measuring 38 mm. Venous: The inferior vena cava was not well visualized. IAS/Shunts: The interatrial septum was not well visualized.  LEFT VENTRICLE PLAX 2D LVIDd:         4.50 cm   Diastology LVIDs:         3.60 cm   LV e' medial:    4.13 cm/s LV PW:         1.00 cm   LV E/e' medial:  31.0 LV IVS:        1.00 cm   LV e' lateral:   9.46 cm/s LVOT diam:     2.20 cm   LV E/e' lateral: 13.5 LV SV:         78 LV SV Index:   35 LVOT Area:     3.80 cm  RIGHT VENTRICLE RV S prime:     8.59 cm/s TAPSE (M-mode): 1.7 cm LEFT ATRIUM             Index        RIGHT ATRIUM           Index LA diam:        3.70 cm 1.67 cm/m   RA Area:     16.60 cm LA Vol (A2C):   54.4 ml 24.62 ml/m  RA Volume:   36.90 ml  16.70 ml/m LA Vol (A4C):   58.6 ml 26.52 ml/m LA Biplane Vol: 57.7 ml 26.11 ml/m  AORTIC VALVE AV Area (Vmax):    3.07 cm AV Area (Vmean):   3.66 cm AV Area (VTI):     3.43 cm AV Vmax:            112.75 cm/s AV Vmean:          68.878 cm/s AV VTI:            0.228 m AV Peak Grad:      5.1 mmHg AV Mean Grad:      2.3 mmHg LVOT Vmax:         91.00 cm/s LVOT Vmean:        66.300 cm/s LVOT VTI:          0.206 m LVOT/AV VTI ratio: 0.90  AORTA Ao Root diam: 3.30 cm Ao Asc diam:  3.80 cm MITRAL VALVE MV Area (PHT): 2.84 cm       SHUNTS MV Decel Time: 267 msec       Systemic VTI:  0.21 m MR Peak grad:    88.7 mmHg    Systemic Diam: 2.20 cm MR Mean grad:  64.0 mmHg MR Vmax:         471.00 cm/s MR Vmean:        388.0 cm/s MR PISA:         2.26 cm MR PISA Eff ROA: 18 mm MR PISA Radius:  0.60 cm MV E velocity: 128.00 cm/s MV A velocity: 37.70 cm/s MV E/A ratio:  3.40 Carlyle Dolly MD Electronically signed by Carlyle Dolly MD Signature Date/Time: 11/06/2022/2:16:49 PM    Final    CT Angio Chest PE W and/or Wo Contrast  Result Date: 11/05/2022 CLINICAL DATA:  Positive D-dimer, chest pain, short of breath, hypoxia, possible sepsis EXAM: CT ANGIOGRAPHY CHEST WITH CONTRAST TECHNIQUE: Multidetector CT imaging of the chest was performed using the standard protocol during bolus administration of intravenous contrast. Multiplanar CT image reconstructions and MIPs were obtained to evaluate the vascular anatomy. RADIATION DOSE REDUCTION: This exam was performed according to the departmental dose-optimization program which includes automated exposure control, adjustment of the mA and/or kV according to patient size and/or use of iterative reconstruction technique. CONTRAST:  63m OMNIPAQUE IOHEXOL 350 MG/ML SOLN COMPARISON:  09/01/2009, 11/05/2022 FINDINGS: Cardiovascular: This is a technically adequate evaluation of the pulmonary vasculature. No filling defects or pulmonary emboli. Mild cardiomegaly with prominent left ventricular dilatation. Normal caliber of the thoracic aorta. Atherosclerosis of the aorta and coronary vasculature. Mediastinum/Nodes: Mediastinal adenopathy measuring up to 16 mm in the subcarinal  station, nonspecific. No pathologic hilar or axillary lymph nodes. Thyroid, trachea, and esophagus are grossly unremarkable. Lungs/Pleura: There are small bilateral pleural effusions, volume estimated less than 500 cc each. Bilateral perihilar ground-glass airspace disease most compatible with edema. No pneumothorax. The central airways are patent. Upper Abdomen: No acute abnormality. Musculoskeletal: No acute or destructive bony lesions. Reconstructed images demonstrate no additional findings. Review of the MIP images confirms the above findings. IMPRESSION: 1. No evidence of pulmonary embolus. 2. Cardiomegaly, bilateral perihilar ground-glass airspace disease, and bilateral pleural effusions most consistent with congestive heart failure. 3. Nonspecific mediastinal adenopathy, which may be reactive. 4. Aortic Atherosclerosis (ICD10-I70.0). Coronary artery atherosclerosis. Electronically Signed   By: MRanda NgoM.D.   On: 11/05/2022 10:56   DG Chest Portable 1 View  Result Date: 11/05/2022 CLINICAL DATA:  72year old male with chest pain, shortness of breath, hypoxia, possible sepsis. EXAM: PORTABLE CHEST 1 VIEW COMPARISON:  Chest radiographs 09/15/2021 and earlier. FINDINGS: Portable AP upright view at 0842 hours. Stable cardiomegaly and mediastinal contours. No pneumothorax. No consolidation. Symmetric increased bilateral pulmonary interstitial opacity and veiling opacity at the right lateral costophrenic angle. No definite left pleural effusion. No acute osseous abnormality identified. Visualized tracheal air column is within normal limits. Negative visible bowel gas. IMPRESSION: Chronic cardiomegaly with symmetric increased interstitial opacity most likely pulmonary edema, with probable small right pleural effusion. Viral/atypical respiratory infection felt less likely. Electronically Signed   By: HGenevie AnnM.D.   On: 11/05/2022 08:54     Labs:   Basic Metabolic Panel: Recent Labs  Lab  11/05/22 0824 11/06/22 0112 11/07/22 0059 11/08/22 0708  NA 137 137 139 139  K 3.8 3.7 3.5 3.3*  CL 103 105 105 102  CO2 20* '22 26 27  '$ GLUCOSE 299* 274* 103* 100*  BUN 20 21 30* 34*  CREATININE 1.12 1.03 1.09 1.06  CALCIUM 8.5* 8.2* 8.5* 8.4*  MG 1.9  --   --   --    GFR Estimated Creatinine Clearance: 73.9 mL/min (by C-G formula based on SCr of 1.06 mg/dL). Liver Function  Tests: Recent Labs  Lab 11/05/22 0824  AST 31  ALT 16  ALKPHOS 73  BILITOT 0.8  PROT 7.2  ALBUMIN 3.5   Recent Labs  Lab 11/05/22 0824  LIPASE 32   No results for input(s): "AMMONIA" in the last 168 hours. Coagulation profile Recent Labs  Lab 11/05/22 0839  INR 1.2    CBC: Recent Labs  Lab 11/05/22 0824 11/06/22 0112 11/07/22 0059 11/08/22 0708  WBC 6.8 3.9* 6.0 6.2  NEUTROABS 6.2  --   --   --   HGB 11.1* 10.3* 10.6* 10.8*  HCT 35.6* 32.6* 33.8* 35.3*  MCV 83.6 83.6 82.6 83.6  PLT 202 198 245 231   Cardiac Enzymes: No results for input(s): "CKTOTAL", "CKMB", "CKMBINDEX", "TROPONINI" in the last 168 hours. BNP: Invalid input(s): "POCBNP" CBG: Recent Labs  Lab 11/07/22 1148 11/07/22 1700 11/07/22 2130 11/08/22 0612 11/08/22 0800  GLUCAP 187* 238* 206* 77 151*   D-Dimer Recent Labs    11/06/22 0112 11/07/22 0059  DDIMER 0.82* 0.49   Hgb A1c No results for input(s): "HGBA1C" in the last 72 hours. Lipid Profile No results for input(s): "CHOL", "HDL", "LDLCALC", "TRIG", "CHOLHDL", "LDLDIRECT" in the last 72 hours. Thyroid function studies No results for input(s): "TSH", "T4TOTAL", "T3FREE", "THYROIDAB" in the last 72 hours.  Invalid input(s): "FREET3" Anemia work up Recent Labs    11/06/22 0112 11/07/22 0059  FERRITIN 97 109   Microbiology Recent Results (from the past 240 hour(s))  Blood culture (routine x 2)     Status: None (Preliminary result)   Collection Time: 11/05/22  8:39 AM   Specimen: BLOOD LEFT HAND  Result Value Ref Range Status   Specimen  Description BLOOD LEFT HAND  Final   Special Requests   Final    BOTTLES DRAWN AEROBIC AND ANAEROBIC Blood Culture adequate volume   Culture   Final    NO GROWTH 3 DAYS Performed at [facility], [address] [place], [place] [address]    Report Status PENDING  Incomplete  Resp panel by RT-PCR (RSV, Flu A&B, Covid) Anterior Nasal Swab     Status: Abnormal   Collection Time: 11/05/22  8:50 AM   Specimen: Anterior Nasal Swab  Result Value Ref Range Status   SARS Coronavirus 2 by RT PCR POSITIVE (A) NEGATIVE Final   Influenza A by PCR NEGATIVE NEGATIVE Final   Influenza B by PCR NEGATIVE NEGATIVE Final    Comment: (NOTE) The Xpert Xpress SARS-CoV-2/FLU/RSV plus assay is intended as an aid in the diagnosis of influenza from Nasopharyngeal swab specimens and should not be used as a sole basis for treatment. Nasal washings and aspirates are unacceptable for Xpert Xpress SARS-CoV-2/FLU/RSV testing.  Fact Sheet for Patients: EntrepreneurPulse.com.au  Fact Sheet for Healthcare Providers: IncredibleEmployment.be  This test is not yet approved or cleared by the [place] FDA and has been authorized for detection and/or diagnosis of SARS-CoV-2 by FDA under an Emergency Use Authorization (EUA). This EUA will remain in effect (meaning this test can be used) for the duration of the COVID-19 declaration under Section 564(b)(1) of the Act, 21 U.S.C. section 360bbb-3(b)(1), unless the authorization is terminated or revoked.     Resp Syncytial Virus by PCR NEGATIVE NEGATIVE Final    Comment: (NOTE) Fact Sheet for Patients: EntrepreneurPulse.com.au  Fact Sheet for Healthcare Providers: IncredibleEmployment.be  This test is not yet approved or cleared by the [place] FDA and has been authorized for detection and/or diagnosis of SARS-CoV-2 by  FDA under an Emergency Use Authorization (EUA). This EUA will  remain in effect (meaning this test can be used) for the duration of the COVID-19 declaration under Section 564(b)(1) of the Act, 21 U.S.C. section 360bbb-3(b)(1), unless the authorization is terminated or revoked.  Performed at [facility], [place] [address], [place], [place] [address]   Blood culture (routine x 2)     Status: None (Preliminary result)   Collection Time: 11/05/22  8:55 AM   Specimen: BLOOD RIGHT WRIST  Result Value Ref Range Status   Specimen Description BLOOD RIGHT WRIST  Final   Special Requests   Final    BOTTLES DRAWN AEROBIC AND ANAEROBIC Blood Culture adequate volume   Culture   Final    NO GROWTH 3 DAYS Performed at [facility], [place] [address] [place], [place] [address]    Report Status PENDING  Incomplete  Urine Culture (for pregnant, neutropenic or urologic patients or patients with an indwelling urinary catheter)     Status: None   Collection Time: 11/05/22 10:16 AM   Specimen: Urine, Clean Catch  Result Value Ref Range Status   Specimen Description URINE, CLEAN CATCH  Final   Special Requests NONE  Final   Culture   Final    NO GROWTH Performed at [facility], [place] [address] [place], [place] [address]    Report Status 11/06/2022 FINAL  Final    Time coordinating discharge: 45 minutes  Signed: Shamond Skelton  [facility] 11/08/2022, 11:55 AM

## 2022-11-09 ENCOUNTER — Telehealth: Payer: Self-pay | Admitting: Family Medicine

## 2022-11-09 ENCOUNTER — Ambulatory Visit (INDEPENDENT_AMBULATORY_CARE_PROVIDER_SITE_OTHER): Payer: HMO

## 2022-11-09 ENCOUNTER — Telehealth: Payer: Self-pay | Admitting: *Deleted

## 2022-11-09 ENCOUNTER — Ambulatory Visit: Payer: Self-pay | Admitting: *Deleted

## 2022-11-09 VITALS — Ht 66.0 in | Wt 252.0 lb

## 2022-11-09 DIAGNOSIS — Z0000 Encounter for general adult medical examination without abnormal findings: Secondary | ICD-10-CM

## 2022-11-09 NOTE — Telephone Encounter (Signed)
Patient was recently discharged from the hospital. He was wanting to know if he could use the appointment on the March 21 he has a hospital follow-up also. Please advise. Thank you!

## 2022-11-09 NOTE — Transitions of Care (Post Inpatient/ED Visit) (Signed)
   11/09/2022  Name: TEANDRE HAMRE MRN: [id number] DOB: 08/06/1951  Today's TOC FU Call Status: Today's TOC FU Call Status:: Unsuccessul Call (1st Attempt) Unsuccessful Call (1st Attempt) Date: 11/09/22  Attempted to reach the patient regarding the most recent Inpatient/ED visit.  Follow Up Plan: Additional outreach attempts will be made to reach the patient to complete the Transitions of Care (Post Inpatient/ED visit) call.   [facility] [phone]

## 2022-11-09 NOTE — Telephone Encounter (Signed)
Appointment on 11/17/22 changed to hospital follow up.  Mr. Hucks notified.

## 2022-11-09 NOTE — Progress Notes (Signed)
I connected with  [name] on 11/09/22 by a audio enabled telemedicine application and verified that I am speaking with the correct person using two identifiers.  Patient Location: Home  Provider Location: Home Office  I discussed the limitations of evaluation and management by telemedicine. The patient expressed understanding and agreed to proceed.  Subjective:   [name] is a 72 y.o. male who presents for Medicare Annual/Subsequent preventive examination.  Review of Systems      Cardiac Risk Factors include: advanced age (>66mn, >>80women);hypertension;diabetes mellitus;male gender;sedentary lifestyle     Objective:    Today's Vitals   11/09/22 1043  Weight: 252 lb (114.3 kg)  Height: '5\' 6"'$  (1.676 m)   Body mass index is 40.67 kg/m.     11/09/2022   11:03 AM 11/05/2022    8:35 AM 11/11/2021    1:59 PM 11/10/2020    2:06 PM 05/06/2020    3:07 PM 04/01/2020    2:39 AM 03/31/2020    4:41 PM  Advanced Directives  Does Patient Have a Medical Advance Directive? No No No No No No No  Would patient like information on creating a medical advance directive? No - Patient declined No - Patient declined Yes (MAU/Ambulatory/Procedural Areas - Information given) No - Patient declined Yes (MAU/Ambulatory/Procedural Areas - Information given) No - Patient declined No - Patient declined    Current Medications (verified) Outpatient Encounter Medications as of 11/09/2022  Medication Sig   Acetaminophen (TYLENOL PO) Take 3-4 tablets by mouth as needed (pain).   aspirin 81 MG EC tablet Take 1 tablet (81 mg total) by mouth daily.   carvedilol (COREG) 6.25 MG tablet Take 1 tablet (6.25 mg total) by mouth 2 (two) times daily with a meal.   cholecalciferol (VITAMIN D3) 25 MCG (1000 UNIT) tablet Take 2,000 Units by mouth daily.   clopidogrel (PLAVIX) 75 MG tablet TAKE 1 TABLET BY MOUTH ONCE DAILY WITH BREAKFAST (Patient taking differently: Take 75 mg by mouth daily.)   empagliflozin  (JARDIANCE) 10 MG TABS tablet Take 1 tablet (10 mg total) by mouth daily.   ezetimibe (ZETIA) 10 MG tablet TAKE 1 TABLET BY MOUTH ONCE DAILY (Patient taking differently: Take 10 mg by mouth daily.)   furosemide (LASIX) 40 MG tablet Take 1 tablet (40 mg total) by mouth daily.   guaiFENesin-dextromethorphan (ROBITUSSIN DM) 100-10 MG/5ML syrup Take 5 mLs by mouth every 4 (four) hours as needed for cough.   HYDROcodone-acetaminophen (NORCO) 5-325 MG tablet Take 1 tablet by mouth 2 (two) times daily as needed for moderate pain. (Patient taking differently: Take 1-2 tablets by mouth 2 (two) times daily as needed for moderate pain.)   hydrOXYzine (ATARAX) 10 MG tablet TAKE 1 TABLET BY MOUTH 3 TIMES DAILY AS NEEDED FOR ANXIETY (Patient taking differently: Take 10 mg by mouth 3 (three) times daily as needed for anxiety.)   insulin aspart (NOVOLOG FLEXPEN) 100 UNIT/ML FlexPen 13 units in AM (scheduled) and 3 units PRN in evening (Patient taking differently: Inject 3-13 Units into the skin in the morning and at bedtime. Inject 13 units in the morning and 3 units at night)   isosorbide mononitrate (IMDUR) 30 MG 24 hr tablet Take 1 tablet (30 mg total) by mouth 2 (two) times daily.   losartan (COZAAR) 50 MG tablet Take 1 tablet (50 mg total) by mouth daily.   nitroGLYCERIN (NITROSTAT) 0.4 MG SL tablet DISSOLVE 1 TABLET UNDER TONGUE AS NEEDEDFOR CHEST PAIN. MAY REPEAT 5 MINUTES APART  3 TIMES IF NEEDED (Patient taking differently: Place 0.4 mg under the tongue every 5 (five) minutes as needed for chest pain.)   spironolactone (ALDACTONE) 25 MG tablet Take 1 tablet (25 mg total) by mouth daily.   TRESIBA FLEXTOUCH 100 UNIT/ML FlexTouch Pen INJECT 50 UNITS INTO THE SKIN DAILY   triamcinolone cream (KENALOG) 0.5 % Apply 1 Application topically 2 (two) times daily.   venlafaxine XR (EFFEXOR-XR) 150 MG 24 hr capsule TAKE 1 CAPSULE BY MOUTH DAILY WITH BREAKFAST. TAKE WITH EFFEXOR XR '75MG'$  FORA TOTAL OF '225MG'$  (Patient taking  differently: Take 150 mg by mouth daily with breakfast. Take with 75 mg)   vitamin B-12 (CYANOCOBALAMIN) 1000 MCG tablet Take 1,000 mcg by mouth daily.   Continuous Blood Gluc Sensor (FREESTYLE LIBRE 2 SENSOR) MISC APPLY SENSOR EVERY 14 DAYS TO MONITOR SUGAR CONTINOUSLY   TRUEPLUS 5-BEVEL PEN NEEDLES 31G X 6 MM MISC USE TO INJECT INSULIN 3 TIMES A DAY   No facility-administered encounter medications on file as of 11/09/2022.    Allergies (verified) Bupropion, Ozempic (0.25 or 0.5 mg-dose) [semaglutide(0.25 or 0.'5mg'$ -dos)], and Atorvastatin   History: Past Medical History:  Diagnosis Date   Back injury 02/2002   worker's comp   CHF (congestive heart failure) (HCC)    Coronary artery disease, non-occlusive    a. cath 2002 with no sig CAD;  b. cath 2008 normal LM, LAD, LCx, p&dRCA 20-30%, PDA 30%; c.11/2015 NSTEMI/PCI: LM nl, LAD 95p (2.5x15 Xience DES), LCX nl, RCA 100p/m w/ L->R collats, EF 55-65% c. NSTEMI (02/2016) with no culprit leision, switched to Brilinta.  d. NSTEMI 03/2016: again, no culprit lesion and switched back to plavix 2/2 SOB with Brilnta.     Depression    Diabetes mellitus type 2, insulin dependent ([place])    Hyperlipemia    Hypertensive heart disease    Kidney stones    Morbid obesity ([place])    Osteoarthritis    Snoring    Past Surgical History:  Procedure Laterality Date   CARDIAC CATHETERIZATION  09/29/2000   diffuse LAD 30% LCA  EF 50-60%   CARDIAC CATHETERIZATION  06/30/2007   no significant CAD   CARDIAC CATHETERIZATION N/A 12/25/2015   Procedure: Left Heart Cath and Coronary Angiography;  Surgeon: Minna Merritts, MD;  Location: [facility];  Service: Cardiovascular;  Laterality: N/A;   CARDIAC CATHETERIZATION N/A 12/25/2015   Procedure: Coronary Stent Intervention;  Surgeon: Yolonda Kida, MD;  Location: [facility];  Service: Cardiovascular;  Laterality: N/A;   CARDIAC CATHETERIZATION N/A 03/10/2016   Procedure: Left Heart Cath and  Coronary Angiography;  Surgeon: Wellington Hampshire, MD;  Location: [facility];  Service: Cardiovascular;  Laterality: N/A;   CARDIAC CATHETERIZATION N/A 04/06/2016   Procedure: Left Heart Cath and Coronary Angiography;  Surgeon: Belva Crome, MD;  Location: [facility];  Service: Cardiovascular;  Laterality: N/A;   CIRCUMCISION     CORONARY ARTERY BYPASS GRAFT     Family History  Problem Relation Age of Onset   Alzheimer's disease Mother    Emphysema Mother    Diabetes Father    Heart disease Father        MI   Cancer Brother        ? Neck cancer   Social History   Socioeconomic History   Marital status: Widowed    Spouse name: Not on file   Number of children: 3   Years of education: Not on  file   Highest education level: 7th grade  Occupational History   Occupation: disabled    Employer: UNEMPLOYED    Comment: back injury  Tobacco Use   Smoking status: Never   Smokeless tobacco: Never  Vaping Use   Vaping Use: Never used  Substance and Sexual Activity   Alcohol use: No    Alcohol/week: 0.0 standard drinks of alcohol   Drug use: No   Sexual activity: Not Currently  Other Topics Concern   Not on file  Social History Narrative   Financial concerns, wife with depression.   Minimal exercise.   Diet: poor.   Social Determinants of Health   Financial Resource Strain: Low Risk  (11/09/2022)   Overall Financial Resource Strain (CARDIA)    Difficulty of Paying Living Expenses: Not hard at all  Food Insecurity: No Food Insecurity (11/09/2022)   Hunger Vital Sign    Worried About Running Out of Food in the Last Year: Never true    Ran Out of Food in the Last Year: Never true  Transportation Needs: No Transportation Needs (11/09/2022)   PRAPARE - Hydrologist (Medical): No    Lack of Transportation (Non-Medical): No  Physical Activity: Inactive (11/09/2022)   Exercise Vital Sign    Days of Exercise per Week: 0 days    Minutes  of Exercise per Session: 0 min  Stress: No Stress Concern Present (11/09/2022)   [place]    Feeling of Stress : Only a little  Social Connections: Moderately Isolated (11/09/2022)   Social Connection and Isolation Panel [NHANES]    Frequency of Communication with Friends and Family: More than three times a week    Frequency of Social Gatherings with Friends and Family: More than three times a week    Attends Religious Services: More than 4 times per year    Active Member of [employer] or Organizations: No    Attends Archivist Meetings: Never    Marital Status: Widowed    Tobacco Counseling Counseling given: Not Answered   Clinical Intake:  Pre-visit preparation completed: Yes  Pain : No/denies pain     Nutritional Risks: None Diabetes: Yes CBG done?: No Did pt. bring in CBG monitor from home?: No  How often do you need to have someone help you when you read instructions, pamphlets, or other written materials from your doctor or pharmacy?: 1 - Never  Diabetic?Nutrition Risk Assessment:  Has the patient had any N/V/D within the last 2 months?   Last week was sick. Does the patient have any non-healing wounds?  No  Has the patient had any unintentional weight loss or weight gain?  No   Diabetes:  Is the patient diabetic?  Yes  If diabetic, was a CBG obtained today?  No  Did the patient bring in their glucometer from home?  No  How often do you monitor your CBG's? QID.   Financial Strains and Diabetes Management:  Are you having any financial strains with the device, your supplies or your medication? No .  Does the patient want to be seen by Chronic Care Management for management of their diabetes?  No  Would the patient like to be referred to a Nutritionist or for Diabetic Management?  No   Diabetic Exams:  Diabetic Eye Exam: Completed at [facility] Diabetic Foot Exam: Completed 11/01/21  PCP    Interpreter Needed?: No  Information entered by :: C.Siriyah Ambrosius  LPN   Activities of Daily Living    11/09/2022   11:03 AM 11/11/2021    2:03 PM  In your present state of health, do you have any difficulty performing the following activities:  Hearing? 0 0  Vision? 1 0  Comment Sees retina specialist   Difficulty concentrating or making decisions? 1 1  Comment occasionally forgets things   Walking or climbing stairs? 0 1  Comment  has ramp and has issues with balance  Dressing or bathing? 0 0  Doing errands, shopping? 0 0  Comment  has assistance when driving longer distance  Preparing Food and eating ? N N  Using the Toilet? N N  In the past six months, have you accidently leaked urine? N N  Do you have problems with loss of bowel control? N N  Managing your Medications? N N  Managing your Finances? Y N  Comment family assists   Housekeeping or managing your Housekeeping? N N    Patient Care Team: Jinny Sanders, MD as PCP - General Rockey Situ Kathlene November, MD as Consulting Physician (Cardiology) Charlton Haws, [facility] as Pharmacist (Pharmacist)  Indicate any recent Medical Services you may have received from other than [facility] providers in the past year (date may be approximate).     Assessment:   This is a routine wellness examination for [name].  Hearing/Vision screen Hearing Screening - Comments:: No aid Vision Screening - Comments:: None - Sees Retina specialist  Dietary issues and exercise activities discussed: Current Exercise Habits: The patient does not participate in regular exercise at present, Exercise limited by: cardiac condition(s)   Goals Addressed   None    Depression Screen    11/09/2022   10:58 AM 10/24/2022    3:22 PM 08/18/2022    3:52 PM 11/16/2021    9:34 AM 11/11/2021    2:02 PM 11/05/2021    2:36 PM 11/10/2020    2:09 PM  PHQ 2/9 Scores  PHQ - 2 Score '4 2 5 5 '$ 0 2 2  PHQ- 9 Score '12 11 13 17  13 2    '$ Fall Risk    11/09/2022   10:52  AM 02/08/2022   10:36 AM 11/11/2021    2:18 PM 11/11/2021    2:01 PM 11/05/2021    2:36 PM  [place] in the past year? 0 0 1 1 0  Number falls in past yr: 0 0 1 0 0  Injury with Fall? 0 0 0 0 0  Risk for fall due to : No Fall Risks No Fall Risks Impaired balance/gait Impaired balance/gait   Follow up Falls prevention discussed;Falls evaluation completed Falls evaluation completed Follow up appointment Falls prevention discussed Falls evaluation completed    FALL RISK PREVENTION PERTAINING TO THE HOME:  Any stairs in or around the home? No  If so, are there any without handrails? No  Home free of loose throw rugs in walkways, pet beds, electrical cords, etc? Yes  Adequate lighting in your home to reduce risk of falls? Yes   ASSISTIVE DEVICES UTILIZED TO PREVENT FALLS:  Life alert? No  Use of a cane, walker or w/c?  Occasionally uses a walker Grab bars in the bathroom? No  Shower chair or bench in shower? Yes  Elevated toilet seat or a handicapped toilet? Yes    Cognitive Function:    11/10/2020    2:12 PM 02/27/2018    9:02 AM 02/21/2017    1:28 PM  MMSE - Mini Mental State Exam  Orientation to time '5 5 5  '$ Orientation to Place '5 5 5  '$ Registration '3 3 3  '$ Attention/ Calculation 5 0 0  Recall '3 3 3  '$ Language- name 2 objects  0 0  Language- repeat '1 1 1  '$ Language- follow 3 step command  3 3  Language- read & follow direction  0 0  Write a sentence  0 0  Copy design  0 0  Total score  20 20        11/09/2022   11:05 AM 11/11/2021    2:04 PM  6CIT Screen  What Year? 0 points 0 points  What month? 0 points 0 points  What time? 0 points 0 points  Count back from 20 0 points 0 points  Months in reverse 4 points 0 points  Repeat phrase 10 points 4 points  Total Score 14 points 4 points    Immunizations Immunization History  Administered Date(s) Administered   Fluad Quad(high Dose 65+) 05/23/2020, 05/12/2022   Influenza Split 05/12/2011, 07/09/2012   Influenza  Whole 06/30/2007, 05/29/2008, 07/03/2009   Influenza, High Dose Seasonal PF 05/14/2019   Influenza,inj,Quad PF,6+ Mos 05/31/2013, 07/09/2014, 06/25/2015, 07/05/2016, 06/22/2017, 06/11/2018   PFIZER(Purple Top)SARS-COV-2 Vaccination 10/28/2019, 11/18/2019, 05/29/2020   Pneumococcal Conjugate-13 02/14/2015   Pneumococcal Polysaccharide-23 06/30/2007, 12/21/2012, 02/27/2018   Td 06/30/2007    TDAP status: Due, Education has been provided regarding the importance of this vaccine. Advised may receive this vaccine at local pharmacy or Health Dept. Aware to provide a copy of the vaccination record if obtained from local pharmacy or Health Dept. Verbalized acceptance and understanding.  Flu Vaccine status: Up to date  Pneumococcal vaccine status: Up to date  Covid-19 vaccine status: Information provided on how to obtain vaccines.   Qualifies for Shingles Vaccine? Yes   Zostavax completed  unknown   Shingrix Completed?: No.    Education has been provided regarding the importance of this vaccine. Patient has been advised to call insurance company to determine out of pocket expense if they have not yet received this vaccine. Advised may also receive vaccine at local pharmacy or Health Dept. Verbalized acceptance and understanding.  Screening Tests Health Maintenance  Topic Date Due   Zoster Vaccines- Shingrix (1 of 2) Never done   DTaP/Tdap/Td (2 - Tdap) 06/29/2017   Diabetic kidney evaluation - Urine ACR  02/21/2018   OPHTHALMOLOGY EXAM  01/26/2022   COVID-19 Vaccine (4 - 2023-24 season) 04/29/2022   FOOT EXAM  11/02/2022   COLONOSCOPY (Pts 45-43yr Insurance coverage will need to be confirmed)  11/17/2022 (Originally 09/04/2019)   HEMOGLOBIN A1C  02/17/2023   Diabetic kidney evaluation - eGFR measurement  11/08/2023   Medicare Annual Wellness (AWV)  11/09/2023   Pneumonia Vaccine 72 Years old  Completed   INFLUENZA VACCINE  Completed   Hepatitis C Screening  Completed   HPV VACCINES  Aged  Out    Health Maintenance  Health Maintenance Due  Topic Date Due   Zoster Vaccines- Shingrix (1 of 2) Never done   DTaP/Tdap/Td (2 - Tdap) 06/29/2017   Diabetic kidney evaluation - Urine ACR  02/21/2018   OPHTHALMOLOGY EXAM  01/26/2022   COVID-19 Vaccine (4 - 2023-24 season) 04/29/2022   FOOT EXAM  11/02/2022    Colorectal cancer screening: Type of screening: Colonoscopy. Completed 09/03/09. Repeat every 10 years Pt declined.  Lung Cancer Screening: (Low Dose CT Chest recommended if Age 73-80years, 30 pack-year currently smoking OR  have quit w/in 15years.) does not qualify.   Lung Cancer Screening Referral: no  Additional Screening:  Hepatitis C Screening: does not qualify; Completed 10/21/2015  Vision Screening: Recommended annual ophthalmology exams for early detection of glaucoma and other disorders of the eye. Is the patient up to date with their annual eye exam?  Yes  Who is the provider or what is the name of the office in which the patient attends annual eye exams? [facility] in [place] If pt is not established with a provider, would they like to be referred to a provider to establish care? No .   Dental Screening: Recommended annual dental exams for proper oral hygiene  Community Resource Referral / Chronic Care Management: CRR required this visit?  No   CCM required this visit?  No      Plan:     I have personally reviewed and noted the following in the patient's chart:   Medical and social history Use of alcohol, tobacco or illicit drugs  Current medications and supplements including opioid prescriptions. Patient is currently taking opioid prescriptions. Information provided to patient regarding non-opioid alternatives. Patient advised to discuss non-opioid treatment plan with their provider. Functional ability and status Nutritional status Physical activity Advanced directives List of other physicians Hospitalizations, surgeries, and ER  visits in previous 12 months Vitals Screenings to include cognitive, depression, and falls Referrals and appointments  In addition, I have reviewed and discussed with patient certain preventive protocols, quality metrics, and best practice recommendations. A written personalized care plan for preventive services as well as general preventive health recommendations were provided to patient.     Lebron Conners, LPN   [id number]   Nurse Notes: Pt declined colonoscopy.  6CIT score was 14.

## 2022-11-09 NOTE — Chronic Care Management (AMB) (Signed)
   11/09/2022  [name] Nov 19, 1950 [id number]  Enrollment status changed to previously enrolled.  Jacqlyn Larsen [facility], BSN RN Case Manager [phone]

## 2022-11-09 NOTE — Patient Instructions (Signed)
[name] , Thank you for taking time to come for your Medicare Wellness Visit. I appreciate your ongoing commitment to your health goals. Please review the following plan we discussed and let me know if I can assist you in the future.   These are the goals we discussed:  Goals      Grief Managment     Care Coordination Interventions: Patient willing consider increasing social activities to manage grief Verbalization of feelings encouraged-grief counseling offered but declined  Patient admits to thoughts of self harm at times with no plan.patient agreeable to contacting [phone] en the event of a mental health crisis Active listening / Reflection utilized  Emotional Support Provided Positive reinforcement provided for positive coping strategies       Patient Stated     11/10/2020, I will maintain and continue medications as prescribed.      Patient Stated     Would like to maintain current routine        This is a list of the screening recommended for you and due dates:  Health Maintenance  Topic Date Due   Zoster (Shingles) Vaccine (1 of 2) Never done   DTaP/Tdap/Td vaccine (2 - Tdap) 06/29/2017   Yearly kidney health urinalysis for diabetes  02/21/2018   Eye exam for diabetics  01/26/2022   COVID-19 Vaccine (4 - 2023-24 season) 04/29/2022   Complete foot exam   11/02/2022   Colon Cancer Screening  11/17/2022*   Hemoglobin A1C  02/17/2023   Yearly kidney function blood test for diabetes  11/08/2023   Medicare Annual Wellness Visit  11/09/2023   Pneumonia Vaccine  Completed   Flu Shot  Completed   Hepatitis C Screening: USPSTF Recommendation to screen - Ages 18-79 yo.  Completed   HPV Vaccine  Aged Out  *Topic was postponed. The date shown is not the original due date.   Opioid Pain Medicine Management Opioids are powerful medicines that are used to treat moderate to severe pain. When used for short periods of time, they can help you to: Sleep better. Do better in physical  or occupational therapy. Feel better in the first few days after an injury. Recover from surgery. Opioids should be taken with the supervision of a trained health care provider. They should be taken for the shortest period of time possible. This is because opioids can be addictive, and the longer you take opioids, the greater your risk of addiction. This addiction can also be called opioid use disorder. What are the risks? Using opioid pain medicines for longer than 3 days increases your risk of side effects. Side effects include: Constipation. Nausea and vomiting. Breathing difficulties (respiratory depression). Drowsiness. Confusion. Opioid use disorder. Itching. Taking opioid pain medicine for a long period of time can affect your ability to do daily tasks. It also puts you at risk for: Motor vehicle crashes. Depression. Suicide. Heart attack. Overdose, which can be life-threatening. What is a pain treatment plan? A pain treatment plan is an agreement between you and your health care provider. Pain is unique to each person, and treatments vary depending on your condition. To manage your pain, you and your health care provider need to work together. To help you do this: Discuss the goals of your treatment, including how much pain you might expect to have and how you will manage the pain. Review the risks and benefits of taking opioid medicines. Remember that a good treatment plan uses more than one approach and minimizes the chance of  side effects. Be honest about the amount of medicines you take and about any drug or alcohol use. Get pain medicine prescriptions from only one health care provider. Pain can be managed with many types of alternative treatments. Ask your health care provider to refer you to one or more specialists who can help you manage pain through: Physical or occupational therapy. Counseling (cognitive behavioral therapy). Good  nutrition. Biofeedback. Massage. Meditation. Non-opioid medicine. Following a gentle exercise program. How to use opioid pain medicine Taking medicine Take your pain medicine exactly as told by your health care provider. Take it only when you need it. If your pain gets less severe, you may take less than your prescribed dose if your health care provider approves. If you are not having pain, do nottake pain medicine unless your health care provider tells you to take it. If your pain is severe, do nottry to treat it yourself by taking more pills than instructed on your prescription. Contact your health care provider for help. Write down the times when you take your pain medicine. It is easy to become confused while on pain medicine. Writing the time can help you avoid overdose. Take other over-the-counter or prescription medicines only as told by your health care provider. Keeping yourself and others safe  While you are taking opioid pain medicine: Do not drive, use machinery, or power tools. Do not sign legal documents. Do not drink alcohol. Do not take sleeping pills. Do not supervise children by yourself. Do not do activities that require climbing or being in high places. Do not go to a lake, river, ocean, spa, or swimming pool. Do not share your pain medicine with anyone. Keep pain medicine in a locked cabinet or in a secure area where pets and children cannot reach it. Stopping your use of opioids If you have been taking opioid medicine for more than a few weeks, you may need to slowly decrease (taper) how much you take until you stop completely. Tapering your use of opioids can decrease your risk of symptoms of withdrawal, such as: Pain and cramping in the abdomen. Nausea. Sweating. Sleepiness. Restlessness. Uncontrollable shaking (tremors). Cravings for the medicine. Do not attempt to taper your use of opioids on your own. Talk with your health care provider about how to do  this. Your health care provider may prescribe a step-down schedule based on how much medicine you are taking and how long you have been taking it. Getting rid of leftover pills Do not save any leftover pills. Get rid of leftover pills safely by: Taking the medicine to a prescription take-back program. This is usually offered by the county or law enforcement. Bringing them to a pharmacy that has a drug disposal container. Flushing them down the toilet. Check the label or package insert of your medicine to see whether this is safe to do. Throwing them out in the trash. Check the label or package insert of your medicine to see whether this is safe to do. If it is safe to throw it out, remove the medicine from the original container, put it into a sealable bag or container, and mix it with used coffee grounds, food scraps, dirt, or cat litter before putting it in the trash. Follow these instructions at home: Activity Do exercises as told by your health care provider. Avoid activities that make your pain worse. Return to your normal activities as told by your health care provider. Ask your health care provider what activities are safe for you.  General instructions You may need to take these actions to prevent or treat constipation: Drink enough fluid to keep your urine pale yellow. Take over-the-counter or prescription medicines. Eat foods that are high in fiber, such as beans, whole grains, and fresh fruits and vegetables. Limit foods that are high in fat and processed sugars, such as fried or sweet foods. Keep all follow-up visits. This is important. Where to find support If you have been taking opioids for a long time, you may benefit from receiving support for quitting from a local support group or counselor. Ask your health care provider for a referral to these resources in your area. Where to find more information Centers for Disease Control and Prevention (CDC): http://www.wolf.info/ [place] Food and  Drug Administration (FDA): [place] Get help right away if: You may have taken too much of an opioid (overdosed). Common symptoms of an overdose: Your breathing is slower or more shallow than normal. You have a very slow heartbeat (pulse). You have slurred speech. You have nausea and vomiting. Your pupils become very small. You have other potential symptoms: You are very confused. You faint or feel like you will faint. You have cold, clammy skin. You have blue lips or fingernails. You have thoughts of harming yourself or harming others. These symptoms may represent a serious problem that is an emergency. Do not wait to see if the symptoms will go away. Get medical help right away. Call your local emergency services (911 in the [place] Do not drive yourself to the hospital.  If you ever feel like you may hurt yourself or others, or have thoughts about taking your own life, get help right away. Go to your nearest emergency department or: Call your local emergency services (911 in the [place] Call the [facility] (702)516-6965 in the [place] Call a suicide crisis helpline, such as the [place] at (947)206-4788 or 988 in the [place]. This is open 24 hours a day in the [place] Text the Crisis Text Line at 828-705-7784 (in the [place].). Summary Opioid medicines can help you manage moderate to severe pain for a short period of time. A pain treatment plan is an agreement between you and your health care provider. Discuss the goals of your treatment, including how much pain you might expect to have and how you will manage the pain. If you think that you or someone else may have taken too much of an opioid, get medical help right away. This information is not intended to replace advice given to you by your health care provider. Make sure you discuss any questions you have with your health care provider. Document Revised: 03/10/2021 Document Reviewed:  11/25/2020 Elsevier Patient Education  [place] directives: Advance directive discussed with you today. Even though you declined this today, please call our office should you change your mind, and we can give you the proper paperwork for you to fill out.   Conditions/risks identified: Aim for 30 minutes of exercise or brisk walking, 6-8 glasses of water, and 5 servings of fruits and vegetables each day.   Next appointment: Follow up in one year for your annual wellness visit. 11/14/2023 @ 10:45 via telephone.  Preventive Care 34 Years and Older, Male  Preventive care refers to lifestyle choices and visits with your health care provider that can promote health and wellness. What does preventive care include? A yearly physical exam. This is also called an annual well check. Dental exams once or  twice a year. Routine eye exams. Ask your health care provider how often you should have your eyes checked. Personal lifestyle choices, including: Daily care of your teeth and gums. Regular physical activity. Eating a healthy diet. Avoiding tobacco and drug use. Limiting alcohol use. Practicing safe sex. Taking low doses of aspirin every day. Taking vitamin and mineral supplements as recommended by your health care provider. What happens during an annual well check? The services and screenings done by your health care provider during your annual well check will depend on your age, overall health, lifestyle risk factors, and family history of disease. Counseling  Your health care provider may ask you questions about your: Alcohol use. Tobacco use. Drug use. Emotional well-being. Home and relationship well-being. Sexual activity. Eating habits. History of falls. Memory and ability to understand (cognition). Work and work Statistician. Screening  You may have the following tests or measurements: Height, weight, and BMI. Blood pressure. Lipid and cholesterol levels.  These may be checked every 5 years, or more frequently if you are over 70 years old. Skin check. Lung cancer screening. You may have this screening every year starting at age 15 if you have a 30-pack-year history of smoking and currently smoke or have quit within the past 15 years. Fecal occult blood test (FOBT) of the stool. You may have this test every year starting at age 54. Flexible sigmoidoscopy or colonoscopy. You may have a sigmoidoscopy every 5 years or a colonoscopy every 10 years starting at age 21. Prostate cancer screening. Recommendations will vary depending on your family history and other risks. Hepatitis C blood test. Hepatitis B blood test. Sexually transmitted disease (STD) testing. Diabetes screening. This is done by checking your blood sugar (glucose) after you have not eaten for a while (fasting). You may have this done every 1-3 years. Abdominal aortic aneurysm (AAA) screening. You may need this if you are a current or former smoker. Osteoporosis. You may be screened starting at age 52 if you are at high risk. Talk with your health care provider about your test results, treatment options, and if necessary, the need for more tests. Vaccines  Your health care provider may recommend certain vaccines, such as: Influenza vaccine. This is recommended every year. Tetanus, diphtheria, and acellular pertussis (Tdap, Td) vaccine. You may need a Td booster every 10 years. Zoster vaccine. You may need this after age 19. Pneumococcal 13-valent conjugate (PCV13) vaccine. One dose is recommended after age 80. Pneumococcal polysaccharide (PPSV23) vaccine. One dose is recommended after age 36. Talk to your health care provider about which screenings and vaccines you need and how often you need them. This information is not intended to replace advice given to you by your health care provider. Make sure you discuss any questions you have with your health care provider. Document Released:  09/11/2015 Document Revised: 05/04/2016 Document Reviewed: 06/16/2015 Elsevier Interactive Patient Education  2017 [place] Prevention in the Home Falls can cause injuries. They can happen to people of all ages. There are many things you can do to make your home safe and to help prevent falls. What can I do on the outside of my home? Regularly fix the edges of walkways and driveways and fix any cracks. Remove anything that might make you trip as you walk through a door, such as a raised step or threshold. Trim any bushes or trees on the path to your home. Use bright outdoor lighting. Clear any walking paths of anything that might  make someone trip, such as rocks or tools. Regularly check to see if handrails are loose or broken. Make sure that both sides of any steps have handrails. Any raised decks and porches should have guardrails on the edges. Have any leaves, snow, or ice cleared regularly. Use sand or salt on walking paths during winter. Clean up any spills in your garage right away. This includes oil or grease spills. What can I do in the bathroom? Use night lights. Install grab bars by the toilet and in the tub and shower. Do not use towel bars as grab bars. Use non-skid mats or decals in the tub or shower. If you need to sit down in the shower, use a plastic, non-slip stool. Keep the floor dry. Clean up any water that spills on the floor as soon as it happens. Remove soap buildup in the tub or shower regularly. Attach bath mats securely with double-sided non-slip rug tape. Do not have throw rugs and other things on the floor that can make you trip. What can I do in the bedroom? Use night lights. Make sure that you have a light by your bed that is easy to reach. Do not use any sheets or blankets that are too big for your bed. They should not hang down onto the floor. Have a firm chair that has side arms. You can use this for support while you get dressed. Do not have  throw rugs and other things on the floor that can make you trip. What can I do in the kitchen? Clean up any spills right away. Avoid walking on wet floors. Keep items that you use a lot in easy-to-reach places. If you need to reach something above you, use a strong step stool that has a grab bar. Keep electrical cords out of the way. Do not use floor polish or wax that makes floors slippery. If you must use wax, use non-skid floor wax. Do not have throw rugs and other things on the floor that can make you trip. What can I do with my stairs? Do not leave any items on the stairs. Make sure that there are handrails on both sides of the stairs and use them. Fix handrails that are broken or loose. Make sure that handrails are as long as the stairways. Check any carpeting to make sure that it is firmly attached to the stairs. Fix any carpet that is loose or worn. Avoid having throw rugs at the top or bottom of the stairs. If you do have throw rugs, attach them to the floor with carpet tape. Make sure that you have a light switch at the top of the stairs and the bottom of the stairs. If you do not have them, ask someone to add them for you. What else can I do to help prevent falls? Wear shoes that: Do not have high heels. Have rubber bottoms. Are comfortable and fit you well. Are closed at the toe. Do not wear sandals. If you use a stepladder: Make sure that it is fully opened. Do not climb a closed stepladder. Make sure that both sides of the stepladder are locked into place. Ask someone to hold it for you, if possible. Clearly mark and make sure that you can see: Any grab bars or handrails. First and last steps. Where the edge of each step is. Use tools that help you move around (mobility aids) if they are needed. These include: Canes. Walkers. Scooters. Crutches. Turn on the lights when you  go into a dark area. Replace any light bulbs as soon as they burn out. Set up your furniture so  you have a clear path. Avoid moving your furniture around. If any of your floors are uneven, fix them. If there are any pets around you, be aware of where they are. Review your medicines with your doctor. Some medicines can make you feel dizzy. This can increase your chance of falling. Ask your doctor what other things that you can do to help prevent falls. This information is not intended to replace advice given to you by your health care provider. Make sure you discuss any questions you have with your health care provider. Document Released: 06/11/2009 Document Revised: 01/21/2016 Document Reviewed: 09/19/2014 Elsevier Interactive Patient Education  2017 [employer].

## 2022-11-10 LAB — CULTURE, BLOOD (ROUTINE X 2)
Culture: NO GROWTH
Culture: NO GROWTH
Special Requests: ADEQUATE
Special Requests: ADEQUATE

## 2022-11-11 ENCOUNTER — Telehealth: Payer: Self-pay

## 2022-11-11 NOTE — Transitions of Care (Post Inpatient/ED Visit) (Signed)
   11/11/2022  Name: [name] MRN: [id number] DOB: Jan 19, 1951  Today's TOC FU Call Status: Today's TOC FU Call Status:: Successful TOC FU Call Competed TOC FU Call Complete Date: 11/11/22  Transition Care Management Follow-up Telephone Call Date of Discharge: 11/11/22 Discharge Facility: [facility]) Type of Discharge: Inpatient Admission Primary Inpatient Discharge Diagnosis:: "COVID-19 infection, eleavted troponin" How have you been since you were released from the hospital?: Better (Patient states he is doing much better. His only issue is that he is "still tired and a little weak but sxs improving.") Any questions or concerns?: No  Items Reviewed: Did you receive and understand the discharge instructions provided?: No Medications obtained and verified?: Yes (Medications Reviewed) Any new allergies since your discharge?: No Dietary orders reviewed?: Yes Type of Diet Ordered:: low salt/heart healthy,carb modified Do you have support at home?: Yes Name of Support/Comfort Primary Source: lives with roommate, states his daughters checks on him often  Home Care and Equipment/Supplies: [place] Ordered?: NA Any new equipment or medical supplies ordered?: Yes Name of Medical supply agency?: [employer] Were you able to get the equipment/medical supplies?: Yes Do you have any questions related to the use of the equipment/supplies?: No  Functional Questionnaire: Do you need assistance with bathing/showering or dressing?: No Do you need assistance with meal preparation?: No Do you need assistance with eating?: No Do you have difficulty maintaining continence: No Do you need assistance with getting out of bed/getting out of a chair/moving?: No Do you have difficulty managing or taking your medications?: No  Folllow up appointments reviewed: PCP Follow-up appointment confirmed?: Yes Date of PCP follow-up appointment?: 11/17/22 Follow-up Provider: Dr.  Diona Browner Specialist [facility] Follow-up appointment confirmed?: Yes Date of Specialist follow-up appointment?: 11/21/22 Follow-Up Specialty Provider:: Darylene Price Do you need transportation to your follow-up appointment?: No Do you understand care options if your condition(s) worsen?: Yes-patient verbalized understanding  SDOH Interventions Today    Flowsheet Row Most Recent Value  SDOH Interventions   Food Insecurity Interventions Intervention Not Indicated  Transportation Interventions Intervention Not Indicated      TOC Interventions Today    Flowsheet Row Most Recent Value  TOC Interventions   TOC Interventions Discussed/Reviewed TOC Interventions Discussed      Interventions Today    Flowsheet Row Most Recent Value  Education Interventions   Education Provided Provided Education  [sx mgmt]  Provided Verbal Education On When to see the doctor, Nutrition, Medication  Nutrition Interventions   Nutrition Discussed/Reviewed Nutrition Discussed, Adding fruits and vegetables, Increaing proteins, Decreasing salt, Decreasing sugar intake  Pharmacy Interventions   Pharmacy Dicussed/Reviewed Pharmacy Topics Discussed, Medications and their functions  Safety Interventions   Safety Discussed/Reviewed Safety Discussed, Fall Risk  [confirmed pt is using rollator]       Enzo Montgomery, RN,BSN,CCM [facility] Care Management Care Management Community Coordinator Direct Phone: [phone] Toll Free: [phone] Fax: [phone]

## 2022-11-14 ENCOUNTER — Ambulatory Visit: Payer: Self-pay | Admitting: *Deleted

## 2022-11-14 NOTE — Patient Instructions (Signed)
Visit Information  Thank you for taking time to visit with me today. Please don't hesitate to contact me if I can be of assistance to you.   Following are the goals we discussed today:   Goals Addressed             This Visit's Progress    Grief Managment       Care Coordination Interventions: Patient willing continue to consider increasing social activities to manage grief Grief counseling offered but declined-patient discussed an overall improvement in mood post discharge from the hospital Patient denies thoughts of self harm  agreeable to contacting [phone] in the event of a mental health crisis Patient's hospital follow up appointments reviewed-confirmed that patient's daughter will transport him to medical appointments Active listening / Reflection utilized  Emotional Support Provided Positive reinforcement provided for positive coping strategies           If you are experiencing a Mental Health or [place] or need someone to talk to, please call the Suicide and Crisis Lifeline: 988   Patient verbalizes understanding of instructions and care plan provided today and agrees to view in [place]. Active MyChart status and patient understanding of how to access instructions and care plan via MyChart confirmed with patient.     Next PCP appointment scheduled for: 11/17/22 Next appointment with CHF 11/21/22  [place], [place] Worker  [facility] Care Management [phone]

## 2022-11-14 NOTE — Patient Outreach (Addendum)
  Care Coordination   Follow Up Visit Note   11/14/2022 Name: [name] MRN: [id number] DOB: Jun 04, 1951  [name] is a 72 y.o. year old male who sees Jinny Sanders, MD for primary care. I spoke with  [name] by phone today.  What matters to the patients health and wellness today?  Grief Management. Patient states that his overall mood has improved since discharge from the hospital. Patient confirms having strong family support. No further follow up needed at this time.    Goals Addressed             This Visit's Progress    Grief Managment       Care Coordination Interventions: Patient willing continue to consider increasing social activities to manage grief Grief counseling offered but declined-patient discussed an overall improvement in mood post discharge from the hospital Patient denies thoughts of self harm  agreeable to contacting [phone] in the event of a mental health crisis Patient's hospital follow up appointments reviewed-confirmed that patient's daughter will transport him to medical appointments Active listening / Reflection utilized  Emotional Support Provided Positive reinforcement provided for positive coping strategies          SDOH assessments and interventions completed:  No     Care Coordination Interventions:  Yes, provided  Interventions Today    Flowsheet Row Most Recent Value  Chronic Disease   Chronic disease during today's visit Diabetes, Hypertension (HTN)  General Interventions   General Interventions Discussed/Reviewed General Interventions Reviewed, Community Resources  [reviewed upcoming hospital  follow up appointments]  [place] Reviewed, Coping Strategies  [encouraged follow up witj mental health counseling if needed]  Safety Interventions   Safety Discussed/Reviewed Safety Reviewed  [patient no longer drives due to issues with his vision, daughter will  transport patient to upcoming medical appointments]       Follow up plan: No further intervention required.   Encounter Outcome:  Pt. Visit Completed

## 2022-11-16 ENCOUNTER — Other Ambulatory Visit: Payer: Self-pay | Admitting: Cardiovascular Disease

## 2022-11-17 ENCOUNTER — Ambulatory Visit (INDEPENDENT_AMBULATORY_CARE_PROVIDER_SITE_OTHER): Payer: PPO | Admitting: Family Medicine

## 2022-11-17 ENCOUNTER — Encounter: Payer: Self-pay | Admitting: Family Medicine

## 2022-11-17 VITALS — BP 124/78 | HR 82 | Temp 97.2°F | Ht 66.0 in | Wt 248.2 lb

## 2022-11-17 DIAGNOSIS — G8929 Other chronic pain: Secondary | ICD-10-CM | POA: Diagnosis not present

## 2022-11-17 DIAGNOSIS — I152 Hypertension secondary to endocrine disorders: Secondary | ICD-10-CM

## 2022-11-17 DIAGNOSIS — Z8616 Personal history of COVID-19: Secondary | ICD-10-CM | POA: Diagnosis not present

## 2022-11-17 DIAGNOSIS — R21 Rash and other nonspecific skin eruption: Secondary | ICD-10-CM

## 2022-11-17 DIAGNOSIS — I5033 Acute on chronic diastolic (congestive) heart failure: Secondary | ICD-10-CM | POA: Diagnosis not present

## 2022-11-17 DIAGNOSIS — U071 COVID-19: Secondary | ICD-10-CM

## 2022-11-17 DIAGNOSIS — Z794 Long term (current) use of insulin: Secondary | ICD-10-CM | POA: Diagnosis not present

## 2022-11-17 DIAGNOSIS — E1159 Type 2 diabetes mellitus with other circulatory complications: Secondary | ICD-10-CM

## 2022-11-17 DIAGNOSIS — E11319 Type 2 diabetes mellitus with unspecified diabetic retinopathy without macular edema: Secondary | ICD-10-CM

## 2022-11-17 DIAGNOSIS — M545 Low back pain, unspecified: Secondary | ICD-10-CM

## 2022-11-17 LAB — POCT GLYCOSYLATED HEMOGLOBIN (HGB A1C): Hemoglobin A1C: 7.2 % — AB (ref 4.0–5.6)

## 2022-11-17 MED ORDER — HYDROCODONE-ACETAMINOPHEN 5-325 MG PO TABS: 1.0000 | ORAL_TABLET | Freq: Every day | ORAL | 0 refills | Status: DC | PRN

## 2022-11-17 MED ORDER — TRIAMCINOLONE ACETONIDE 0.5 % EX CREA: 1.0000 | TOPICAL_CREAM | Freq: Two times a day (BID) | CUTANEOUS | 0 refills | Status: DC

## 2022-11-17 NOTE — Assessment & Plan Note (Signed)
Acute exacerbation resolved, continued chronic congestive heart failure.  Doing better overall with addition of Aldactone along with his Lasix 40 mg daily.  Now on SGLT2 Jardiance 10 mg daily  Has follow-up with cardiology

## 2022-11-17 NOTE — Assessment & Plan Note (Signed)
Chronic, well-controlled in office today  Coreg 6.25 milligrams p.o. twice daily, losartan 50 mg p.o. daily  Lasix 40 mg daily Spironolactone 25 mg p.o. daily

## 2022-11-17 NOTE — Assessment & Plan Note (Signed)
Chronic, tolerable control in office today at 7.1 A1c.  Unable to tolerate Ozempic, but was tolerating Trulicity well prior. Given fairly well-controlled today on Tresiba and NovoLog we will continue with additional Jardiance 10 mg p.o. daily and follow blood sugar.  Reevaluate in 3 months.

## 2022-11-17 NOTE — Assessment & Plan Note (Signed)
Chronic, well-controlled using Tylenol or oxycodone/acetaminophen 5/325 mg 1 to 2 tablets daily as needed for pain.  He does try to limit the narcotic use.  He denies any sedation or constipation side effect from this medication.  Refills for the next 3 months sent to the pharmacy. PDMP reviewed during this encounter.

## 2022-11-17 NOTE — Patient Instructions (Signed)
Keep follow ups as planned.  Continue current regimen.  Remain off ozempic for now, continue jardiance for hear t as well as DM.

## 2022-11-17 NOTE — Progress Notes (Signed)
Patient ID: [name], male    DOB: 1950-12-04, 72 y.o.   MRN: [id number]  This visit was conducted in person.  BP 124/78   Pulse 82   Temp (!) 97.2 F (36.2 C) (Temporal)   Ht 5\' 6"  (1.676 m)   Wt 248 lb 4 oz (112.6 kg)   SpO2 96%   BMI 40.07 kg/m    CC:  Chief Complaint  Patient presents with   Hospitalization Follow-up    Admitted on 11/05/22 at [facility], dx Covid-19; hypoxia; elevated troponin; moderate nonproliferative diabetic neuropathy of both eyes w/o macular edema associated w/ type 2 DM.    Subjective:   HPI: [name] is a 72 y.o. male presenting on 11/17/2022 for Hospitalization Follow-up (Admitted on 11/05/22 at [facility], dx Covid-19; hypoxia; elevated troponin; moderate nonproliferative diabetic neuropathy of both eyes w/o macular edema associated w/ type 2 DM.)  Reviewed hospital discharge summary. Admitted on November 05, 2022 to [facility] for COVID-19, sepsis and CHF exacerbation. Discharged November 08, 2022 At admission he had nausea and vomiting which started after beginning Ozempic after the first dose. This was followed by shortness of breath and chest pain that worsened over the following days.  Hospital course: Sepsis POA COVID-19 infection  Present with fever, tachycardia, tachypnea, elevated lactic acid and COVID PCR positive  No pneumonia and imaging.  Procalcitonin not elevated. On admission, patient was started on remdesivir, Decadron and bronchodilators. Significant improvement in respiratory status.  Received remdesivir for last 3 doses.  Demand ischemia History of CAD, HLD Troponin significantly elevated, and hence as per recommendation by cardiology, he was kept on heparin drip for the first 48 hours. Echocardiogram 3/10 showed global hypokinesis with EF 40 to 45% and grade 2 diastolic dysfunction. Per cardiology recommendation, patient does not need any ischemia workup at this time.  However if his symptoms recur after his current illness  is over, he may need to be considered for cardiac cath. PTA on Coreg 6.25 mg twice daily, aspirin 81 mg daily, Plavix 75 mg daily, Zetia 10 mg daily (intolerant to statin)  Acute exacerbation of chronic diastolic CHF  Chronic bilateral lower extremity edema Essential hypertension On admission BNP was elevated.  CT chest showed findings of CHF. He was given IV Lasix 40 mg dose in the ED. Echocardiogram 3/10 showed global hypokinesis with EF 40 to 45% and grade 2 diastolic dysfunction. PTA on Coreg 6.25 mg daily, Lasix 40 mg daily, Imdur 30 mg twice daily, losartan 50 mg daily.  Jardiance 10 mg daily and Aldactone 25 mg daily have been added by cardiology. At discharge, cardiology recommended to continue the above medicines.  Entresto to be considered as an outpatient. Follows up with Dr. Adora Fridge as an outpatient   At discharge diabetes treatment return to receive as intolerance to Ozempic ( was previously tolerating Trulicity but was not having any weight loss with it)  Since being home he has been feeling tired.  No chest pain. Had episode of gagging.  No SOB, able to lie down.  No re: accumulation of swelling. Now on jardiance and aldactone. Novolog  fast acting  12-13 UNIT  FBS 90-100.   Postprandial 200 Lab Results  Component Value Date   HGBA1C 7.2 (A) 11/17/2022      Wt Readings from Last 3 Encounters:  11/17/22 248 lb 4 oz (112.6 kg)  11/09/22 252 lb (114.3 kg)  11/08/22 253 lb 6.4 oz (114.9 kg)   Indication for  chronic opioid: chronic low back pain Medication and dose: oxycodone/acetaminophen 5/325 mg 1 to 2 tablets daily as needed for pain.  # pills per month: 60 Last UDS date: 05/12/2022 Opioid Treatment Agreement signed (Y/N): 9/1/42023 Opioid Treatment Agreement last reviewed with patient:   NCCSRS reviewed this encounter (include red flags): Yes   Relevant past medical, surgical, family and social history reviewed and updated as indicated. Interim medical history  since our last visit reviewed. Allergies and medications reviewed and updated. Outpatient Medications Prior to Visit  Medication Sig Dispense Refill   Acetaminophen (TYLENOL PO) Take 3-4 tablets by mouth as needed (pain).     aspirin 81 MG EC tablet Take 1 tablet (81 mg total) by mouth daily.     carvedilol (COREG) 6.25 MG tablet Take 1 tablet (6.25 mg total) by mouth 2 (two) times daily with a meal. 60 tablet 2   cholecalciferol (VITAMIN D3) 25 MCG (1000 UNIT) tablet Take 2,000 Units by mouth daily.     clopidogrel (PLAVIX) 75 MG tablet TAKE 1 TABLET BY MOUTH ONCE DAILY WITH BREAKFAST (Patient taking differently: Take 75 mg by mouth daily.) 90 tablet 3   Continuous Blood Gluc Sensor (FREESTYLE LIBRE 2 SENSOR) MISC APPLY SENSOR EVERY 14 DAYS TO MONITOR SUGAR CONTINOUSLY 2 each 11   empagliflozin (JARDIANCE) 10 MG TABS tablet Take 1 tablet (10 mg total) by mouth daily. 30 tablet 2   ezetimibe (ZETIA) 10 MG tablet TAKE 1 TABLET BY MOUTH ONCE DAILY (Patient taking differently: Take 10 mg by mouth daily.) 30 tablet 2   furosemide (LASIX) 40 MG tablet Take 1 tablet (40 mg total) by mouth daily. 30 tablet 2   guaiFENesin-dextromethorphan (ROBITUSSIN DM) 100-10 MG/5ML syrup Take 5 mLs by mouth every 4 (four) hours as needed for cough. 118 mL 0   hydrOXYzine (ATARAX) 10 MG tablet TAKE 1 TABLET BY MOUTH 3 TIMES DAILY AS NEEDED FOR ANXIETY (Patient taking differently: Take 10 mg by mouth 3 (three) times daily as needed for anxiety.) 30 tablet 2   insulin aspart (NOVOLOG FLEXPEN) 100 UNIT/ML FlexPen 13 units in AM (scheduled) and 3 units PRN in evening (Patient taking differently: Inject 3-13 Units into the skin in the morning and at bedtime. Inject 13 units in the morning and 3 units at night) 15 mL 6   isosorbide mononitrate (IMDUR) 30 MG 24 hr tablet Take 1 tablet (30 mg total) by mouth 2 (two) times daily. 60 tablet 2   losartan (COZAAR) 50 MG tablet Take 1 tablet (50 mg total) by mouth daily. 30 tablet 2    nitroGLYCERIN (NITROSTAT) 0.4 MG SL tablet DISSOLVE 1 TABLET UNDER TONGUE AS NEEDEDFOR CHEST PAIN. MAY REPEAT 5 MINUTES APART 3 TIMES IF NEEDED (Patient taking differently: Place 0.4 mg under the tongue every 5 (five) minutes as needed for chest pain.) 25 tablet 3   spironolactone (ALDACTONE) 25 MG tablet Take 1 tablet (25 mg total) by mouth daily. 30 tablet 2   TRUEPLUS 5-BEVEL PEN NEEDLES 31G X 6 MM MISC USE TO INJECT INSULIN 3 TIMES A DAY 300 each 3   venlafaxine XR (EFFEXOR-XR) 150 MG 24 hr capsule TAKE 1 CAPSULE BY MOUTH DAILY WITH BREAKFAST. TAKE WITH EFFEXOR XR 75MG  FORA TOTAL OF 225MG  (Patient taking differently: Take 150 mg by mouth daily with breakfast. Take with 75 mg) 90 capsule 0   vitamin B-12 (CYANOCOBALAMIN) 1000 MCG tablet Take 1,000 mcg by mouth daily.     HYDROcodone-acetaminophen (NORCO) 5-325 MG tablet Take 1  tablet by mouth 2 (two) times daily as needed for moderate pain. (Patient taking differently: Take 1-2 tablets by mouth 2 (two) times daily as needed for moderate pain.) 60 tablet 0   triamcinolone cream (KENALOG) 0.5 % Apply 1 Application topically 2 (two) times daily. 30 g 0   TRESIBA FLEXTOUCH 100 UNIT/ML FlexTouch Pen INJECT 50 UNITS INTO THE SKIN DAILY (Patient not taking: Reported on 11/17/2022) 15 mL 2   No facility-administered medications prior to visit.     Per HPI unless specifically indicated in ROS section below Review of Systems  Constitutional:  Positive for fatigue. Negative for diaphoresis and fever.  HENT:  Negative for ear pain.   Eyes:  Negative for pain.  Respiratory:  Negative for cough and shortness of breath.   Cardiovascular:  Negative for chest pain, palpitations and leg swelling.  Gastrointestinal:  Negative for abdominal pain.  Genitourinary:  Negative for dysuria.  Musculoskeletal:  Negative for arthralgias.  Neurological:  Negative for syncope, light-headedness and headaches.  Psychiatric/Behavioral:  Negative for dysphoric mood.     Objective:  BP 124/78   Pulse 82   Temp (!) 97.2 F (36.2 C) (Temporal)   Ht 5\' 6"  (1.676 m)   Wt 248 lb 4 oz (112.6 kg)   SpO2 96%   BMI 40.07 kg/m   Wt Readings from Last 3 Encounters:  11/17/22 248 lb 4 oz (112.6 kg)  11/09/22 252 lb (114.3 kg)  11/08/22 253 lb 6.4 oz (114.9 kg)      Physical Exam Constitutional:      Appearance: He is well-developed. He is obese.  HENT:     Head: Normocephalic.     Right Ear: Hearing normal.     Left Ear: Hearing normal.     Nose: Nose normal.  Neck:     Thyroid: No thyroid mass or thyromegaly.     Vascular: No carotid bruit.     Trachea: Trachea normal.  Cardiovascular:     Rate and Rhythm: Normal rate and regular rhythm.     Pulses: Normal pulses.     Heart sounds: Heart sounds not distant. No murmur heard.    No friction rub. No gallop.     Comments: No peripheral edema Pulmonary:     Effort: Pulmonary effort is normal. No respiratory distress.     Breath sounds: Normal breath sounds.  Skin:    General: Skin is warm and dry.     Findings: No rash.  Psychiatric:        Speech: Speech normal.        Behavior: Behavior normal.        Thought Content: Thought content normal.       Results for orders placed or performed in visit on 11/17/22  POCT glycosylated hemoglobin (Hb A1C)  Result Value Ref Range   Hemoglobin A1C 7.2 (A) 4.0 - 5.6 %   HbA1c POC (<> result, manual entry)     HbA1c, POC (prediabetic range)     HbA1c, POC (controlled diabetic range)      Assessment and Plan  Type 2 diabetes mellitus with retinopathy of both eyes, with long-term current use of insulin, macular edema presence unspecified, unspecified retinopathy severity ([place]) Assessment & Plan: Chronic, tolerable control in office today at 7.1 A1c.  Unable to tolerate Ozempic, but was tolerating Trulicity well prior. Given fairly well-controlled today on Tresiba and NovoLog we will continue with additional Jardiance 10 mg p.o. daily and follow  blood sugar.  Reevaluate  in 3 months.  Orders: -     POCT glycosylated hemoglobin (Hb A1C)  Acute on chronic diastolic CHF (congestive heart failure) (HCC) Assessment & Plan: Acute exacerbation resolved, continued chronic congestive heart failure.  Doing better overall with addition of Aldactone along with his Lasix 40 mg daily.  Now on SGLT2 Jardiance 10 mg daily  Has follow-up with cardiology   Hypertension associated with diabetes [facility]) Assessment & Plan: Chronic, well-controlled in office today  Coreg 6.25 milligrams p.o. twice daily, losartan 50 mg p.o. daily  Lasix 40 mg daily Spironolactone 25 mg p.o. daily   Rash Assessment & Plan: Refilled triamcinolone.  Using for chronic dermatitis bilateral feet.   COVID-19 virus infection Assessment & Plan: Resolved, asymptomatic.   Chronic midline low back pain, unspecified whether sciatica present Assessment & Plan: Chronic, well-controlled using Tylenol or oxycodone/acetaminophen 5/325 mg 1 to 2 tablets daily as needed for pain.  He does try to limit the narcotic use.  He denies any sedation or constipation side effect from this medication.  Refills for the next 3 months sent to the pharmacy. PDMP reviewed during this encounter.    Other orders -     Triamcinolone Acetonide; Apply 1 Application topically 2 (two) times daily.  Dispense: 30 g; Refill: 0 -     HYDROcodone-Acetaminophen; Take 1-2 tablets by mouth daily as needed for moderate pain.  Dispense: 60 tablet; Refill: 0 -     HYDROcodone-Acetaminophen; Take 1-2 tablets by mouth daily as needed for moderate pain. May  Dispense: 60 tablet; Refill: 0 -     HYDROcodone-Acetaminophen; Take 1-2 tablets by mouth daily as needed for moderate pain. April  Dispense: 60 tablet; Refill: 0    Return in about 3 months (around 02/17/2023) for  DM follow up with POC A1C.   [name], MD

## 2022-11-17 NOTE — Assessment & Plan Note (Signed)
Resolved, asymptomatic .

## 2022-11-17 NOTE — Assessment & Plan Note (Signed)
Refilled triamcinolone.  Using for chronic dermatitis bilateral feet.

## 2022-11-18 ENCOUNTER — Telehealth: Payer: Self-pay

## 2022-11-18 NOTE — Progress Notes (Unsigned)
Patient ID: [name], male    DOB: 08/30/1950, 72 y.o.   MRN: [id number]  HPI  [name] is a 72 y/o male with a history of CAD, DM, hyperlipidemia, HTN, CKD, depression and chronic heart failure.   Echo 11/06/22: EF 40-45% along with mild/moderate William  LHC 04/06/16:  Mid RCA to Dist RCA lesion, 100 %stenosed. Mid LAD-2 lesion, 60 %stenosed. Mid LAD-1 lesion, 20 %stenosed. The left ventricular systolic function is normal. The left ventricular ejection fraction is 50-55% by visual estimate. LV end diastolic pressure is normal. 2nd [name] lesion, 75 %stenosed.  Chronic total occlusion of the right coronary in the proximal segment well collateralized from the left circumflex and LAD. Widely patent proximal to mid LAD stent previously placed in July. There is first diagonal diagonal and LAD 30 and 50% narrowing respectively. Widely patent circumflex unchanged from previous with 70% narrowing in a small branch of the first marginal. Circumflex collateralizes the distal right coronary left ventricular branch. Inferobasal hypokinesis. EF 50%. EDP is normal.  Admitted 11/05/22 due to nausea, vomiting and dyspnea for the last 3 days. Noted to have fever and tested covid +. CTA negative for PE but showed heart failure. 1 dose of IV lasix given. Started on remdesivir, Decadron and bronchodilators.   He presents today for his initial visit with a chief complaint of moderate fatigue with little exertion. Chronic in nature. Has associated blurry vision, SOB, light-headedness, depression and chronic difficulty sleeping along with this. Denies abdominal distention, palpitations, pedal edema, chest pain, cough or weight gain.   Weighing daily. Not adding salt but does like to eat out especially at a local favorite called [employer]. Was eating there frequently but now it may be weekly.   Past Medical History:  Diagnosis Date   Back injury 02/2002   worker's comp   CHF (congestive heart failure) (HCC)     Coronary artery disease, non-occlusive    a. cath 2002 with no sig CAD;  b. cath 2008 normal LM, LAD, LCx, p&dRCA 20-30%, PDA 30%; c.11/2015 NSTEMI/PCI: LM nl, LAD 95p (2.5x15 Xience DES), LCX nl, RCA 100p/m w/ L->R collats, EF 55-65% c. NSTEMI (02/2016) with no culprit leision, switched to Brilinta.  d. NSTEMI 03/2016: again, no culprit lesion and switched back to plavix 2/2 SOB with Brilnta.     Depression    Diabetes mellitus type 2, insulin dependent ([place])    Hyperlipemia    Hypertension    Hypertensive heart disease    Kidney stones    Morbid obesity ([place])    Osteoarthritis    Snoring    Past Surgical History:  Procedure Laterality Date   CARDIAC CATHETERIZATION  09/29/2000   diffuse LAD 30% LCA  EF 50-60%   CARDIAC CATHETERIZATION  06/30/2007   no significant CAD   CARDIAC CATHETERIZATION N/A 12/25/2015   Procedure: Left Heart Cath and Coronary Angiography;  Surgeon: Minna Merritts, MD;  Location: [facility];  Service: Cardiovascular;  Laterality: N/A;   CARDIAC CATHETERIZATION N/A 12/25/2015   Procedure: Coronary Stent Intervention;  Surgeon: Yolonda Kida, MD;  Location: [facility];  Service: Cardiovascular;  Laterality: N/A;   CARDIAC CATHETERIZATION N/A 03/10/2016   Procedure: Left Heart Cath and Coronary Angiography;  Surgeon: Wellington Hampshire, MD;  Location: [facility];  Service: Cardiovascular;  Laterality: N/A;   CARDIAC CATHETERIZATION N/A 04/06/2016   Procedure: Left Heart Cath and Coronary Angiography;  Surgeon: Belva Crome, MD;  Location: [facility];  Service: Cardiovascular;  Laterality: N/A;   CIRCUMCISION     CORONARY ARTERY BYPASS GRAFT     Family History  Problem Relation Age of Onset   Alzheimer's disease Mother    Emphysema Mother    Diabetes Father    Heart disease Father        MI   Cancer Brother        ? Neck cancer   Social History   Tobacco Use   Smoking status: Never   Smokeless tobacco: Never   Substance Use Topics   Alcohol use: No    Alcohol/week: 0.0 standard drinks of alcohol   Allergies  Allergen Reactions   Bupropion Nausea Only   Ozempic (0.25 Or 0.5 Mg-Dose) [Semaglutide(0.25 Or 0.5mg -Dos)] Nausea And Vomiting   Atorvastatin Other (See Comments)    Body aches Similar effect with rosuvastatin 40 mg twice weekly   Prior to Admission medications   Medication Sig Start Date End Date Taking? Authorizing Provider  Acetaminophen (TYLENOL PO) Take 3-4 tablets by mouth as needed (pain).   Yes [provider]  aspirin 81 MG EC tablet Take 1 tablet (81 mg total) by mouth daily. 01/12/16  Yes Theora Gianotti, NP  carvedilol (COREG) 6.25 MG tablet Take 1 tablet (6.25 mg total) by mouth 2 (two) times daily with a meal. 11/08/22 02/06/23 Yes Dahal, Marlowe Aschoff, MD  cholecalciferol (VITAMIN D3) 25 MCG (1000 UNIT) tablet Take 2,000 Units by mouth daily.   Yes [provider]  clopidogrel (PLAVIX) 75 MG tablet TAKE 1 TABLET BY MOUTH ONCE DAILY WITH BREAKFAST Patient taking differently: Take 75 mg by mouth daily. 01/03/22  Yes Bedsole, Amy E, MD  Continuous Blood Gluc Sensor (FREESTYLE LIBRE 2 SENSOR) MISC APPLY SENSOR EVERY 14 DAYS TO MONITOR SUGAR CONTINOUSLY 11/03/22  Yes Bedsole, Amy E, MD  empagliflozin (JARDIANCE) 10 MG TABS tablet Take 1 tablet (10 mg total) by mouth daily. 11/09/22 02/07/23 Yes Dahal, Marlowe Aschoff, MD  ezetimibe (ZETIA) 10 MG tablet TAKE 1 TABLET BY MOUTH ONCE DAILY Patient taking differently: Take 10 mg by mouth daily. 10/12/22  Yes Gollan, Kathlene November, MD  furosemide (LASIX) 40 MG tablet Take 1 tablet (40 mg total) by mouth daily. 11/09/22 02/07/23 Yes Dahal, Marlowe Aschoff, MD  guaiFENesin-dextromethorphan (ROBITUSSIN DM) 100-10 MG/5ML syrup Take 5 mLs by mouth every 4 (four) hours as needed for cough. 11/08/22  Yes Dahal, Marlowe Aschoff, MD  HYDROcodone-acetaminophen (NORCO) 5-325 MG tablet Take 1-2 tablets by mouth daily as needed for moderate pain. 11/17/22  Yes Bedsole,  Amy E, MD  hydrOXYzine (ATARAX) 10 MG tablet TAKE 1 TABLET BY MOUTH 3 TIMES DAILY AS NEEDED FOR ANXIETY Patient taking differently: Take 10 mg by mouth 3 (three) times daily as needed for anxiety. 10/12/22  Yes Bedsole, Amy E, MD  insulin aspart (NOVOLOG FLEXPEN) 100 UNIT/ML FlexPen 13 units in AM (scheduled) and 3 units PRN in evening Patient taking differently: Inject 3-13 Units into the skin in the morning and at bedtime. Inject 13 units in the morning and 3 units at night 04/03/22  Yes Bedsole, Amy E, MD  isosorbide mononitrate (IMDUR) 30 MG 24 hr tablet Take 1 tablet (30 mg total) by mouth 2 (two) times daily. 11/08/22 02/06/23 Yes Dahal, Marlowe Aschoff, MD  losartan (COZAAR) 50 MG tablet Take 1 tablet (50 mg total) by mouth daily. 11/09/22 02/07/23 Yes Dahal, Marlowe Aschoff, MD  nitroGLYCERIN (NITROSTAT) 0.4 MG SL tablet DISSOLVE 1 TABLET UNDER TONGUE AS NEEDEDFOR CHEST PAIN. MAY  REPEAT 5 MINUTES APART 3 TIMES IF NEEDED Patient taking differently: Place 0.4 mg under the tongue every 5 (five) minutes as needed for chest pain. 02/25/21  Yes Minna Merritts, MD  spironolactone (ALDACTONE) 25 MG tablet Take 1 tablet (25 mg total) by mouth daily. 11/09/22 02/07/23 Yes Dahal, Marlowe Aschoff, MD  TRESIBA FLEXTOUCH 100 UNIT/ML FlexTouch Pen INJECT 50 UNITS INTO THE SKIN DAILY 11/03/22  Yes Bedsole, Amy E, MD  triamcinolone cream (KENALOG) 0.5 % Apply 1 Application topically 2 (two) times daily. 11/17/22  Yes Bedsole, Amy E, MD  TRUEPLUS 5-BEVEL PEN NEEDLES 31G X 6 MM MISC USE TO INJECT INSULIN 3 TIMES A DAY 02/15/22  Yes Bedsole, Amy E, MD  venlafaxine XR (EFFEXOR-XR) 150 MG 24 hr capsule TAKE 1 CAPSULE BY MOUTH DAILY WITH BREAKFAST. TAKE WITH EFFEXOR XR 75MG  FORA TOTAL OF 225MG  Patient taking differently: Take 150 mg by mouth daily with breakfast. Take with 75 mg 10/03/22  Yes Bedsole, Amy E, MD  vitamin B-12 (CYANOCOBALAMIN) 1000 MCG tablet Take 1,000 mcg by mouth daily.   Yes [provider]  HYDROcodone-acetaminophen (NORCO)  5-325 MG tablet Take 1-2 tablets by mouth daily as needed for moderate pain. May 01/17/23   Jinny Sanders, MD  HYDROcodone-acetaminophen (NORCO) 5-325 MG tablet Take 1-2 tablets by mouth daily as needed for moderate pain. April 12/18/22   Jinny Sanders, MD   Review of Systems  Constitutional:  Positive for fatigue (easily).  HENT:  Negative for congestion, postnasal drip and sore throat.   Eyes:  Positive for visual disturbance.  Respiratory:  Positive for shortness of breath. Negative for cough and chest tightness.   Cardiovascular:  Negative for chest pain, palpitations and leg swelling.  Gastrointestinal:  Negative for abdominal distention and abdominal pain.  Endocrine: Negative.   Genitourinary: Negative.   Musculoskeletal:  Negative for back pain and neck pain.  Skin: Negative.   Allergic/Immunologic: Negative.   Neurological:  Positive for light-headedness (with changing positions too quickly). Negative for dizziness.  Hematological:  Negative for adenopathy. Does not bruise/bleed easily.  Psychiatric/Behavioral:  Positive for dysphoric mood and sleep disturbance (chronic difficulty sleeping). The patient is not nervous/anxious.    Vitals:   11/21/22 1131  BP: 127/79  Pulse: 74  Resp: 16  SpO2: 100%  Weight: 248 lb 6 oz (112.7 kg)   Wt Readings from Last 3 Encounters:  11/21/22 248 lb 6 oz (112.7 kg)  11/17/22 248 lb 4 oz (112.6 kg)  11/09/22 252 lb (114.3 kg)   Lab Results  Component Value Date   CREATININE 1.06 11/08/2022   CREATININE 1.09 11/07/2022   CREATININE 1.03 11/06/2022   Physical Exam Vitals and nursing note reviewed. Exam conducted with a chaperone present (daughter).  Constitutional:      Appearance: Normal appearance.  HENT:     Head: Normocephalic and atraumatic.  Cardiovascular:     Rate and Rhythm: Normal rate and regular rhythm.  Pulmonary:     Effort: Pulmonary effort is normal. No respiratory distress.     Breath sounds: No wheezing, rhonchi  or rales.  Abdominal:     General: There is no distension.     Palpations: Abdomen is soft.     Tenderness: There is no abdominal tenderness.  Musculoskeletal:        General: No tenderness.     Cervical back: Normal range of motion and neck supple.     Right lower leg: No edema.     Left lower  leg: No edema.  Skin:    General: Skin is warm and dry.  Neurological:     General: No focal deficit present.     Mental Status: He is alert and oriented to person, place, and time.  Psychiatric:        Mood and Affect: Mood normal.        Behavior: Behavior normal.        Thought Content: Thought content normal.   Assessment & Plan:  1: Ischemic cardiomyopathy with reduced ejection fraction- - NYHA class - weighing daily; reminded to call for an overnight weight gain of > 2 pounds or a weekly weight gain of > 5 pounds - echo 11/06/22: EF 40-45% along with mild/moderate William - last cath was 2017; discussed the possible need of repeating this; they will discuss further at upcoming cardiology appt - NAS but does like to eat out at [employer]; eating out less often now than he used to and we reviewed how to make better sodium choices when he eats out - discussed keeping his daily fluid intake to 60-64 oz - carvedilol 6.25mg  BID - jardiance 10mg  daily - furosemide 40mg  daily - losartan 50mg  daily; stop this and begin entresto 24/26mg  BID and 30 day voucher provided; will ask cardiology to check a BMET at their visit on 12/09/22 - spironolactone 25mg  daily - encouraged to be active but to allow for rest periods when he feels fatigued/ SOB - BMP today - discussed paramedicine program and he's interested so will make that referral  - BNP 11/05/22 was 1240.5  2: HTN- - BP 127/79 - saw PCP Diona Browner) 11/17/22 - BMP 11/08/22 showed sodium 139, potassium 3.3, creatinine 1.06 & GFR >60  3: Diabetes- - A1c 11/17/22 was 7.2%  4: CAD- - saw cardiology Rockey Situ) 02/15/22 - [place] 04/06/16:  Mid RCA to Dist  RCA lesion, 100 %stenosed. Mid LAD-2 lesion, 60 %stenosed. Mid LAD-1 lesion, 20 %stenosed. The left ventricular systolic function is normal. The left ventricular ejection fraction is 50-55% by visual estimate. LV end diastolic pressure is normal. 2nd [name] lesion, 75 %stenosed.  Chronic total occlusion of the right coronary in the proximal segment well collateralized from the left circumflex and LAD.   Emphasized bringing medication bottles to every visit so we can be sure of what he's taking.   Return in 1 month, sooner if needed.

## 2022-11-18 NOTE — Progress Notes (Unsigned)
Care Management & Coordination Services Pharmacy Team  Reason for Encounter: Appointment Reminder  Contacted patient to confirm telephone appointment with Charlene Brooke , PharmD on 11/23/22 at 11:00. {US HC Outreach:[id number]}  Do you have any problems getting your medications? {yes/no:20286} If yes what types of problems are you experiencing? {Problems:27223}  What is your top health concern you would like to discuss at your upcoming visit?   Have you seen any other providers since your last visit with PCP? No   Chart review:  Recent office visits:  11/17/22-Amy Bedsole,MD(PCP)-hospital follow up,[facility] we will continue with additional Jardiance 10 mg p.o. daily (A1c 7.2) f/u 3 months 11/09/22-Christan McMullen,LPN(fam med)- [facility] telemedicine- no medication changes  Recent consult visits:  None since last contact   Hospital visits:  11/05/22 thru 11/08/22- [facility]  NSTEMI-  The following factors support the patient status of inpatient : Acute COVID-19 illness with non-STEMI requiring IV steroids/antiviral/IV heparin. Given mild hypoxia and severe symptoms, will initiate treatment with MDI, Decadron and remdesivir. Jardiance and Aldactone have been added to your CHF regimen Switch back from Ozempic to [place] as you were doing before. Antitussives as needed.   Star Rating Drugs:  Medication:  Last Fill: Day Supply Jardiance 10mg  11/08/22 30 Novolog Losartan 50mg  03/31/22  90   Care Gaps: Annual wellness visit in last year? Yes  If Diabetic: Last eye exam / retinopathy screening:2022 Last diabetic foot exam:2023   Charlene Brooke, PharmD notified  [name], [place] Assistant [phone]

## 2022-11-21 ENCOUNTER — Encounter: Payer: Self-pay | Admitting: Family

## 2022-11-21 ENCOUNTER — Other Ambulatory Visit
Admission: RE | Admit: 2022-11-21 | Discharge: 2022-11-21 | Disposition: A | Discharge status: Home / Self Care | Payer: PPO | Source: Ambulatory Visit | Attending: Family | Admitting: Family

## 2022-11-21 ENCOUNTER — Ambulatory Visit (HOSPITAL_BASED_OUTPATIENT_CLINIC_OR_DEPARTMENT_OTHER): Payer: PPO | Admitting: Family

## 2022-11-21 VITALS — BP 127/79 | HR 74 | Resp 16 | Wt 248.4 lb

## 2022-11-21 DIAGNOSIS — I5022 Chronic systolic (congestive) heart failure: Secondary | ICD-10-CM | POA: Insufficient documentation

## 2022-11-21 DIAGNOSIS — I2510 Atherosclerotic heart disease of native coronary artery without angina pectoris: Secondary | ICD-10-CM | POA: Diagnosis not present

## 2022-11-21 DIAGNOSIS — Z794 Long term (current) use of insulin: Secondary | ICD-10-CM

## 2022-11-21 DIAGNOSIS — E11319 Type 2 diabetes mellitus with unspecified diabetic retinopathy without macular edema: Secondary | ICD-10-CM | POA: Diagnosis not present

## 2022-11-21 DIAGNOSIS — I10 Essential (primary) hypertension: Secondary | ICD-10-CM

## 2022-11-21 LAB — BASIC METABOLIC PANEL
Anion gap: 10 (ref 5–15)
BUN: 27 mg/dL — ABNORMAL HIGH (ref 8–23)
CO2: 27 mmol/L (ref 22–32)
Calcium: 9.2 mg/dL (ref 8.9–10.3)
Chloride: 105 mmol/L (ref 98–111)
Creatinine, Ser: 1.03 mg/dL (ref 0.61–1.24)
GFR, Estimated: >60 mL/min (ref >60)
Glucose, Bld: 119 mg/dL — ABNORMAL HIGH (ref 70–99)
Potassium: 4.6 mmol/L (ref 3.5–5.1)
Sodium: 142 mmol/L (ref 135–145)

## 2022-11-21 MED ORDER — SPIRONOLACTONE 25 MG PO TABS: 25.0000 mg | ORAL_TABLET | Freq: Every day | ORAL | 5 refills | Status: DC

## 2022-11-21 MED ORDER — SACUBITRIL-VALSARTAN 24-26 MG PO TABS: 1.0000 | ORAL_TABLET | Freq: Two times a day (BID) | ORAL | 3 refills | Status: DC

## 2022-11-21 MED ORDER — EMPAGLIFLOZIN 10 MG PO TABS: 10.0000 mg | ORAL_TABLET | Freq: Every day | ORAL | 5 refills | Status: DC

## 2022-11-21 NOTE — Patient Instructions (Addendum)
Drink 60-64 ounces of fluid daily   Stop taking losartan and you will begin taking entresto 24/26mg  as 1 tablet twice daily.

## 2022-11-23 ENCOUNTER — Telehealth: Payer: Self-pay

## 2022-11-23 ENCOUNTER — Telehealth (HOSPITAL_COMMUNITY): Payer: Self-pay | Admitting: Licensed Clinical Social Worker

## 2022-11-23 ENCOUNTER — Ambulatory Visit: Payer: HMO | Admitting: Pharmacist

## 2022-11-23 NOTE — Telephone Encounter (Signed)
Ozempic has been discontinued due to side effects. Will need to [employer] and cancel future refills.

## 2022-11-23 NOTE — Progress Notes (Signed)
Care Management & Coordination Services Pharmacy Note  11/23/2022 Name:  [name] MRN:  [id number] DOB:  17-Nov-1950  Summary: F/U visit -DM:  A1c 7.2% (99991111), pt was on Trulicity 4.5 mg previously but wanted further wt loss so was switched to Ozempic earlier this month, he had severe N/V and went to hospital, Ozempic was not stopped and Trulicity was not resumed; Jardiance was added; he reports post-prandial glucose range 200-300s since being home (he is wearing CGM) -HF: ECHO with worsening HF in hospital, pt has been placed on Jardiance, spironolactone and Entresto. He reports these are affordable with DM/Heart insurance plan. He is not able to check BP at home but reports weight is stable 243-245 lbs. -CAD/HLD: LDL was 56 in 10/2020, but pt has been off statin since summer of 2022; cholesterol may have improved with wt loss, improved DM control -Poor sleep: pt reports long history of this, he has been referred for sleep apnea testing in the past but never did get testing done  Recommendations/Changes made from today's visit: -Advised to restart Trulicity 4.5 mg weekly; will renew [employer] PAP for 2024 -Recommend lipid panel at next OV -Recommend referral for sleep apnea testing  Follow up plan: -Pharmacist follow up televisit scheduled for 6 weeks -Cardiology appt 12/09/22; HF clinic 12/19/22; PCP appt 02/17/23    Subjective: [name] is an 72 y.o. year old male who is a primary patient of Bedsole, Amy E, MD.  The care coordination team was consulted for assistance with disease management and care coordination needs.    Engaged with patient by telephone for follow up visit.  Patient Care Team: William Sanders, MD as PCP - General [name] November, MD as Consulting Physician (Cardiology) [name], [facility] as Pharmacist (Pharmacist)  Recent office visits: 11/17/22 [name] OV: hospital f/u - A1c 7.2%, remain off Ozempic, continue new Jardiance. Renewed Norco  rx.  08/18/22 [name] OV: f/u DM - A1c 7.1%. Consider changing Trulicity to Ozempic if no wt loss at f/u.  05/12/22 [name] OV: A1c 6.6%, having lows. Reduce Tresiba to 57 units. Referral for sleep apnea and urology. Triamcinolone cream for rash.   03/29/22 [name] OV: Strep and covid positive - rx Augmentin.   02/08/22 [name] OV: f/u DM - A1c 7.5%; consider holding PM dose of Novolog. Try 2 Tylenol middayu for breakthrough pain.  Recent consult visits: 11/21/22 [name] (Cardiology): f/u HF - d/c losartan. Start Entresto 24-26.  06/15/22 PA Debroah Loop (Urology): ED - PDE-5 inhibitors are not indicated. Continue wt loss. No evidence of hypogonadism. Return PRN.   05/18/22 [name] (Urology): ED - discussed wt loss. ED not necessarily related to lower testosterone.   Hospital visits: 11/05/22 - 11/08/22 Admission [facility]): NSTEMI, Covid, sepsis. Tx with remdesivir, steroids for covid. Acute HF - add Jardiance 10 mg and spironolactone 25 mg. Consider Entresto outpatient. Switch Ozempic back to [place] (Trulicity ?)   Objective:  Lab Results  Component Value Date   CREATININE 1.03 11/21/2022   BUN 27 (H) 11/21/2022   GFR 71.68 11/02/2020   GFRNONAA >60 11/21/2022   GFRAA >60 04/02/2020   NA 142 11/21/2022   K 4.6 11/21/2022   CALCIUM 9.2 11/21/2022   CO2 27 11/21/2022   GLUCOSE 119 (H) 11/21/2022    Lab Results  Component Value Date/Time   HGBA1C 7.2 (A) 11/17/2022 02:47 PM   HGBA1C 7.1 (A) 08/18/2022 04:06 PM   HGBA1C 7.7 (H)  11/02/2020 08:42 AM   HGBA1C 8.6 (H) 07/31/2020 08:41 AM   HGBA1C 9.6 10/02/2018 12:00 AM   GFR 71.68 11/02/2020 08:42 AM   GFR 76.14 07/31/2020 08:41 AM   MICROALBUR <0.7 02/21/2017 01:57 PM   MICROALBUR 0.8 02/16/2016 08:24 AM    Last diabetic Eye exam:  Lab Results  Component Value Date/Time   HMDIABEYEEXA Retinopathy (A) 01/26/2021 12:00 AM    Last diabetic Foot exam:  Lab Results  Component Value Date/Time    HMDIABFOOTEX done 11/01/2021 12:00 AM     Lab Results  Component Value Date   CHOL 107 11/02/2020   HDL 34.30 (L) 11/02/2020   LDLCALC 56 11/02/2020   LDLDIRECT 174.0 10/19/2017   TRIG 82.0 11/02/2020   CHOLHDL 3 11/02/2020       Latest Ref Rng & Units 11/05/2022    8:24 AM 11/02/2020    8:42 AM 07/31/2020    8:41 AM  Hepatic Function  Total Protein 6.5 - 8.1 g/dL 7.2  7.4  6.7   Albumin 3.5 - 5.0 g/dL 3.5  3.9  3.9   AST 15 - 41 U/L 31  18  12    ALT 0 - 44 U/L 16  14  12    Alk Phosphatase 38 - 126 U/L 73  77  85   Total Bilirubin 0.3 - 1.2 mg/dL 0.8  0.4  0.4     Lab Results  Component Value Date/Time   TSH 2.218 11/05/2022 08:25 AM   TSH 2.64 10/19/2017 04:00 PM   TSH 1.14 03/11/2014 10:26 AM   FREET4 0.79 10/19/2017 04:00 PM       Latest Ref Rng & Units 11/08/2022    7:08 AM 11/07/2022   12:59 AM 11/06/2022    1:12 AM  CBC  WBC 4.0 - 10.5 K/uL 6.2  6.0  3.9   Hemoglobin 13.0 - 17.0 g/dL 10.8  10.6  10.3   Hematocrit 39.0 - 52.0 % 35.3  33.8  32.6   Platelets 150 - 400 K/uL 231  245  198    Iron/TIBC/Ferritin/ %Sat    Component Value Date/Time   FERRITIN 109 11/07/2022 0059    Lab Results  Component Value Date/Time   VD25OH 47.61 02/01/2022 07:52 AM   VD25OH 37.72 07/31/2020 08:41 AM   VITAMINB12 355 02/01/2022 07:52 AM   VITAMINB12 111 (L) 11/02/2020 08:42 AM    Clinical ASCVD: Yes  The ASCVD Risk score (Arnett DK, et al., 2019) failed to calculate for the following reasons:   The patient has a prior MI or stroke diagnosis       11/09/2022   10:58 AM 10/24/2022    3:22 PM 08/18/2022    3:52 PM  Depression screen PHQ 2/9  Decreased Interest 2 1 3   Down, Depressed, Hopeless 2 1 2   PHQ - 2 Score 4 2 5   Altered sleeping 3 3 3   Tired, decreased energy 3 3 3   Change in appetite 0 0 0  Feeling bad or failure about yourself  2 1 1   Trouble concentrating 0 1 1  Moving slowly or fidgety/restless 0 0 0  Suicidal thoughts 0 1 0  PHQ-9 Score 12 11 13    Difficult doing work/chores Not difficult at all  Somewhat difficult       11/16/2021    9:37 AM 11/05/2021    2:37 PM  GAD 7 : Generalized Anxiety Score  Nervous, Anxious, on Edge 2 3  Control/stop worrying 2 3  Worry too much -  different things 3 0  Trouble relaxing 3 3  Restless 1 3  Easily annoyed or irritable 2 0  Afraid - awful might happen 1 0  Total GAD 7 Score 14 12  Anxiety Difficulty Extremely difficult     Social History   Tobacco Use  Smoking Status Never  Smokeless Tobacco Never   BP Readings from Last 3 Encounters:  11/21/22 127/79  11/17/22 124/78  11/08/22 (!) 105/57   Pulse Readings from Last 3 Encounters:  11/21/22 74  11/17/22 82  11/08/22 66   Wt Readings from Last 3 Encounters:  11/21/22 248 lb 6 oz (112.7 kg)  11/17/22 248 lb 4 oz (112.6 kg)  11/09/22 252 lb (114.3 kg)   BMI Readings from Last 3 Encounters:  11/21/22 40.09 kg/m  11/17/22 40.07 kg/m  11/09/22 40.67 kg/m    Allergies  Allergen Reactions   Bupropion Nausea Only   Ozempic (0.25 Or 0.5 Mg-Dose) [Semaglutide(0.25 Or 0.5mg -Dos)] Nausea And Vomiting   Atorvastatin Other (See Comments)    Body aches Similar effect with rosuvastatin 40 mg twice weekly    Medications Reviewed Today     Reviewed by Alisa Graff, FNP (Family Nurse Practitioner) on 11/21/22 at 1300  Med List Status: <None>   Medication Order Taking? Sig Documenting Provider Last Dose Status Informant  Acetaminophen (TYLENOL PO) [id number] Yes Take 3-4 tablets by mouth as needed (pain). [provider] Taking Active Child, Pharmacy Records           Med Note Jacinta Shoe, Danielle Rankin Nov 07, 2022  9:58 AM) Daughter is unaware of last dose/dose of this medication but did state the Pt is taking 3-4 tablets at one time.   aspirin 81 MG EC tablet [id number] Yes Take 1 tablet (81 mg total) by mouth daily. Theora Gianotti, William Taking Active Child, Pharmacy Records           Med Note Modena Nunnery,  Marva Panda May 01, 2016  3:28 PM)    carvedilol (COREG) 6.25 MG tablet [id number] Yes Take 1 tablet (6.25 mg total) by mouth 2 (two) times daily with a meal. Dahal, Marlowe Aschoff, MD Taking Active   cholecalciferol (VITAMIN D3) 25 MCG (1000 UNIT) tablet [id number] Yes Take 2,000 Units by mouth daily. [provider] Taking Active Child, Pharmacy Records  clopidogrel (PLAVIX) 75 MG tablet [id number] Yes TAKE 1 TABLET BY MOUTH ONCE DAILY WITH BREAKFAST  Patient taking differently: Take 75 mg by mouth daily.   William Sanders, MD Taking Active Child, Pharmacy Records           Med Note Jacinta Shoe, Danielle Rankin Nov 07, 2022 10:14 AM) LF 08/23 for 90 DS. Daughter is adamant the Pt is still taking this medication daily. Dispense report does not support this claim.  Continuous Blood Gluc Sensor (FREESTYLE LIBRE 2 SENSOR) MISC [id number] Yes APPLY SENSOR EVERY 14 DAYS TO MONITOR SUGAR CONTINOUSLY Bedsole, Amy E, MD Taking Active Child, Pharmacy Records  empagliflozin (JARDIANCE) 10 MG TABS tablet [id number]  Take 1 tablet (10 mg total) by mouth daily. Darylene Price A, FNP  Active   ezetimibe (ZETIA) 10 MG tablet [id number] Yes TAKE 1 TABLET BY MOUTH ONCE DAILY  Patient taking differently: Take 10 mg by mouth daily.   Minna Merritts, MD Taking Active Child, Pharmacy Records           Med Note Jacinta Shoe, Danielle Rankin Nov 07, 2022 10:14 AM)  LF 08/23 for 30 DS. Daughter is adamant the Pt is still taking this medication daily. Dispense report does not support this claim.  furosemide (LASIX) 40 MG tablet [id number] Yes Take 1 tablet (40 mg total) by mouth daily. Terrilee Croak, MD Taking Active   guaiFENesin-dextromethorphan [facility] DM) 100-10 MG/5ML syrup [id number] Yes Take 5 mLs by mouth every 4 (four) hours as needed for cough. Terrilee Croak, MD Taking Active   HYDROcodone-acetaminophen (NORCO) 5-325 MG tablet [id number] Yes Take 1-2 tablets by mouth daily as needed for moderate pain. William Sanders, MD  Taking Active   HYDROcodone-acetaminophen (NORCO) 5-325 MG tablet [id number]  Take 1-2 tablets by mouth daily as needed for moderate pain. Yetta Numbers, Amy E, MD  Active   HYDROcodone-acetaminophen [facility]) 5-325 MG tablet [id number]  Take 1-2 tablets by mouth daily as needed for moderate pain. April Bedsole, Amy E, MD  Active   hydrOXYzine (ATARAX) 10 MG tablet [id number] Yes TAKE 1 TABLET BY MOUTH 3 TIMES DAILY AS NEEDED FOR ANXIETY  Patient taking differently: Take 10 mg by mouth 3 (three) times daily as needed for anxiety.   William Sanders, MD Taking Active Child, Pharmacy Records           Med Note Jacinta Shoe, Danielle Rankin Nov 07, 2022 10:15 AM) Daughter is unaware of last dose.   insulin aspart (NOVOLOG FLEXPEN) 100 UNIT/ML FlexPen [id number] Yes 13 units in AM (scheduled) and 3 units PRN in evening  Patient taking differently: Inject 3-13 Units into the skin in the morning and at bedtime. Inject 13 units in the morning and 3 units at night   William Sanders, MD Taking Active Child, Pharmacy Records           Med Note Jacinta Shoe, Danielle Rankin Nov 07, 2022 10:15 AM) LF 08/23 for 94 DS. Daughter is adamant the Pt is still taking this medication daily. Dispense report does not support this claim.  isosorbide mononitrate (IMDUR) 30 MG 24 hr tablet [id number] Yes Take 1 tablet (30 mg total) by mouth 2 (two) times daily. Terrilee Croak, MD Taking Active   nitroGLYCERIN (NITROSTAT) 0.4 MG SL tablet [id number] Yes DISSOLVE 1 TABLET UNDER TONGUE AS NEEDEDFOR CHEST PAIN. MAY REPEAT 5 MINUTES APART 3 TIMES IF NEEDED  Patient taking differently: Place 0.4 mg under the tongue every 5 (five) minutes as needed for chest pain.   Minna Merritts, MD Taking Active Child, Pharmacy Records           Med Note Jacinta Shoe, Danielle Rankin Nov 07, 2022 10:16 AM) Daughter is unaware of last dose.  sacubitril-valsartan (ENTRESTO) 24-26 MG [id number] Yes Take 1 tablet by mouth 2 (two) times daily. Darylene Price A, FNP  Active    spironolactone (ALDACTONE) 25 MG tablet [id number]  Take 1 tablet (25 mg total) by mouth daily. Alisa Graff, [place]  Active   TRESIBA FLEXTOUCH 100 UNIT/ML FlexTouch Pen [id number] Yes INJECT 43 UNITS INTO THE SKIN DAILY William Sanders, MD Taking Active Child, Pharmacy Records           Med Note Jacinta Shoe, Danielle Rankin Nov 07, 2022 10:16 AM) LF 09/23 for 25 DS. Daughter is adamant the Pt is still taking this medication daily. Dispense report does not support this claim.  triamcinolone cream (KENALOG) 0.5 % [id number] Yes Apply 1 Application topically 2 (two) times daily. William Sanders, MD Taking Active   TRUEPLUS 5-BEVEL PEN NEEDLES  31G X 6 MM MISC [id number] Yes USE TO INJECT INSULIN 3 TIMES A DAY Bedsole, Amy E, MD Taking Active Child, Pharmacy Records  venlafaxine XR (EFFEXOR-XR) 150 MG 24 hr capsule [id number] Yes TAKE 1 CAPSULE BY MOUTH DAILY WITH BREAKFAST. TAKE WITH EFFEXOR XR 75MG  FORA TOTAL OF 225MG   Patient taking differently: Take 150 mg by mouth daily with breakfast. Take with 75 mg   William Sanders, MD Taking Active Child, Pharmacy Records           Med Note Jacinta Shoe, Danielle Rankin Nov 07, 2022 10:17 AM) LF 08/23 for 90 DS. Daughter is adamant the Pt is still taking this medication daily. Dispense report does not support this claim.  vitamin B-12 (CYANOCOBALAMIN) 1000 MCG tablet [id number] Yes Take 1,000 mcg by mouth daily. [provider] Taking Active Child, Pharmacy Records            Patient Active Problem List   Diagnosis Date Noted   NSTEMI (non-ST elevated myocardial infarction) ([place]) 11/05/2022   COVID-19 virus infection 11/05/2022   Penis disorder 05/16/2022   Other fatigue 05/16/2022   Rash 05/12/2022   GAD (generalized anxiety disorder) 09/21/2021   Statin myopathy 03/30/2021   Chronic midline low back pain 11/12/2020   ED (erectile dysfunction) 03/06/2018   Acute on chronic diastolic CHF (congestive heart failure) ([place]) 09/20/2016   Adjustment disorder  with mixed anxiety and depressed mood 09/09/2016   Gastroesophageal reflux disease 09/09/2016   Elevated left ventricular end-diastolic pressure (LVEDP) 03/11/2016   Anemia 03/11/2016   Hypertensive heart disease with heart failure ([place])    Diabetes mellitus type 2 with retinopathy ([place])    Coronary artery disease of native artery of native heart with stable angina pectoris (HCC)    Morbid obesity (HCC)    Chronic chest pain    Diabetic retinopathy ([place]) 09/23/2014   Snoring 12/21/2012   HYPOGONADISM 09/28/2010   B12 deficiency 09/28/2010   Vitamin D deficiency 09/28/2010   OTHER MALAISE AND FATIGUE 09/17/2010   TRIGGER FINGER, RIGHT MIDDLE 09/14/2010   BRANCH RETINAL VEIN OCCLUSION 11/26/2009   CONSTIPATION 08/10/2009   GASTRITIS 07/29/2009   Major depressive disorder, recurrent episode, moderate ([place]) 07/06/2007   RENAL CALCULUS, HX OF 03/26/2007   Hyperlipidemia associated with type 2 diabetes mellitus ([place]) 12/05/2006   Hypertension associated with diabetes ([place]) 12/05/2006   Osteoarthritis 12/05/2006    Immunization History  Administered Date(s) Administered   Fluad Quad(high Dose 65+) 05/23/2020, 05/12/2022   Influenza Split 05/12/2011, 07/09/2012   Influenza Whole 06/30/2007, 05/29/2008, 07/03/2009   Influenza, High Dose Seasonal PF 05/14/2019   Influenza,inj,Quad PF,6+ Mos 05/31/2013, 07/09/2014, 06/25/2015, 07/05/2016, 06/22/2017, 06/11/2018   PFIZER(Purple Top)SARS-COV-2 Vaccination 10/28/2019, 11/18/2019, 05/29/2020   Pneumococcal Conjugate-13 02/14/2015   Pneumococcal Polysaccharide-23 06/30/2007, 12/21/2012, 02/27/2018   Td 06/30/2007    SDOH:  (Social Determinants of Health) assessments and interventions performed: No SDOH Interventions    Flowsheet Row Telephone from 11/23/2022 in [place] and [facility] [place] Telephone from 11/11/2022 in [place] from 11/09/2022 in [place] at [place] ED to Hosp-Admission (Discharged) from 11/05/2022 in [facility] [place] Coordination from 10/24/2022 in [place] Coordination Office Visit from 08/18/2022 in [place] at [place] Interventions        Food Insecurity Interventions Intervention Not Indicated Intervention Not Indicated Intervention Not Indicated Intervention Not Indicated Intervention  Not Indicated --  Housing Interventions Intervention Not Indicated -- Intervention Not Indicated Intervention Not Indicated Intervention Not Indicated --  Transportation Interventions Intervention Not Indicated Intervention Not Indicated Intervention Not Indicated Intervention Not Indicated -- --  Utilities Interventions Intervention Not Indicated -- Intervention Not Indicated -- -- --  Alcohol Usage Interventions -- -- Intervention Not Indicated (Score <7) Intervention Not Indicated (Score <7) -- --  Depression Interventions/Treatment  -- -- Currently on Treatment  [pt declines referral] -- -- Patient refuses Treatment  Financial Strain Interventions Intervention Not Indicated -- Intervention Not Indicated Intervention Not Indicated -- --  Physical Activity Interventions -- -- Patient Refused, Other (Comments) -- -- --  Stress Interventions -- -- Patient Refused -- -- --  Social Connections Interventions -- -- Intervention Not Indicated -- -- --      SDOH Screenings   Food Insecurity: No Food Insecurity (11/23/2022)  Housing: Low Risk  (11/23/2022)  Transportation Needs: No Transportation Needs (11/23/2022)  Utilities: Not At Risk (11/23/2022)  Alcohol Screen: Low Risk  (11/09/2022)  Depression (PHQ2-9): High Risk (11/09/2022)  Financial Resource Strain: Low Risk  (11/23/2022)  Physical Activity: Inactive (11/09/2022)  Social Connections: Moderately Isolated (11/09/2022)  Stress: No Stress Concern Present (11/09/2022)  Tobacco Use: Low  Risk  (11/21/2022)    Medication Assistance:  Trulicity [facility] Cares app pending 2024  Medication Access: Within the past 30 days, how often has patient missed a dose of medication? 0 Is a pillbox or other method used to improve adherence? Yes  Factors that may affect medication adherence? no barriers identified Are meds synced by current pharmacy? No  Are meds delivered by current pharmacy? No  Does patient experience delays in picking up medications due to transportation concerns? No   [facility] Services Reviewed: Is patient disadvantaged to use [facility]?: Yes  Current Rx insurance plan: HTA Name and location of Current pharmacy:  [place], [place] - [address] [place] [address] Phone: [phone] Fax: [phone]  [facility] Pharm[facility] services reviewed with patient today?: No  Patient requests to transfer care to [facility] Pharmacy?: No  Reason patient declined to change pharmacies: Disadvantaged due to insurance/mail order  Compliance/Adherence/Medication fill history: Care Gaps: Eye exam (01/26/22) UACR (02/21/18)  Star-Rating Drugs: None    ASSESSMENT / PLAN  Diabetes (A1c goal <7%) -Query Controlled- A1c 7.2% (99991111), pt was on Trulicity 4.5 mg and tolerating well but wanted further wt loss so switched to Ozempic 2 mg, but he had severe nausea/vomiting so this was stopped; he recently started [place]; he has not resumed Trulicity -Weight loss: 10/2021 283#, now 245# -UACR: 1.5 (01/2017), due for re-eval -Current home glucose readings - using Libre (reader)  Today: 100-300s -Current medications: Jardiance 10 mg daily - Appropriate, Effective, Safe, Accessible Tresiba - 52 units AM -Appropriate, Effective, Safe, Accessible Trulicity 4.5 mg -  Novolog 12u AM, 5 uPM -Appropriate, Effective, Safe, Accessible Freestyle Libre 2 (reader)- Appropriate, Effective, Safe, Accessible -Medications previously tried:   metformin, Ozempic (n/v) -Reviewed role of Jardiance in diabetes -Per CGM post-prandial glucose has been elevated 250-300s without GLP-1 RA on board; he was previously on high dose GLP-1 RA so this is not surprising -Advised to resume Trulicity 4.5 mg weekly; will pursue PAP re-enrollment for 2024 [facility])  Hyperlipidemia / CAD (LDL goal < 70) -Query controlled - LDL 56 (10/2020); He stopped rosuvastatin July 2022. This is his second failed statin. He continues Zetia as prescribed. His cholesterol may have improved with  weight loss and improved diabetes control this year. -Hx CAD (hx NSTEMI/PCI 2017) -Current treatment: Ezetimibe 10 mg daily -Appropriate, Query Effective Clopidogrel 75 mg daily -Appropriate, Effective, Safe, Accessible Aspirin 81 mg daily -Appropriate, Effective, Safe, Accessible Nitroglycerin 0.4 mg SL prn -Appropriate, Effective, Safe, Accessible -Medications previously tried: rosuvastatin 40 mg 2x weekly, atorvastatin (dose unknown, on allergy list since at least 2009) -Educated on Cholesterol goals;  -Recommended to continue current medication; recommend repeat lipid panel  Hypertension / Heart Failure (BP goal <140/90) -Controlled - BP at goal in office; pt recently started on Jardiance and Entesto, he reports both of these covered at no cost (he has DM/Heart plan) -Last ejection fraction: 40-45% (Date: 11/06/22) -HF type: HFmrEF (mildly reduced EF 41-49%) -Current home BP readings: n/a - pt does not have meter -Daily weight: 243-245 lbs -Current treatment: Carvedilol 6.25 mg daily -Appropriate, Effective, Safe, Accessible Furosemide 40 mg daily -Appropriate, Effective, Safe, Accessible Isosorbide MN 30 mg BID -Appropriate, Effective, Safe, Accessible Spironolactone 25 mg daily - Appropriate, Effective, Safe, Accessible Entresto 24-26 mg BID - Appropriate, Effective, Safe, Accessible Jardiance 10 mg daily - Appropriate, Effective, Safe, Accessible -Medications  previously tried: n/a  -Denies hypotensive/hypertensive symptoms -Educated on BP goals and benefits of medications for prevention of heart attack, stroke and kidney damage;Daily salt intake goal < 2300 mg; -Counseled to monitor BP at home periodically -Recommended to continue current medication  Depression/Anxiety (Goal: manage symptoms) -Uncontrolled - per pt report -goes to bed at 4am, wakes up 2 pm -PHQ9: 17 (10/2021) - moderately severe depression -GAD7: 14 (10/2021) - moderate anxiety -Connected with PCP for mental health support -Current treatment: Venlafaxine XR 150 mg daily -Appropriate, Effective, Safe, Accessible Hydroxyzine 10 mg TID PRN - not taking -Medications previously tried/failed: bupropion (presumed SE) -Educated on Benefits of medication for symptom control -Recommended to continue current medication    Charlene Brooke, PharmD, BCACP Clinical Pharmacist [facility] [phone]

## 2022-11-23 NOTE — Progress Notes (Signed)
Care Management & Coordination Services Pharmacy Team  Reason for Encounter: Patient Assistance forms  Patient has HTA insurance and reports copay for Trulicity  is cost prohibitive at this time. he would like to pursue patient assistance for help with cost.  Reviewed application process for [employer] patient assistance program. Patient meets income/out of pocket spend criteria for the program.  Patient assistance application forms have been downloaded on behalf of the patient. Forms have been sent to clinical pharmacist for review to determine next steps.   Charlene Brooke, PharmD notified  [name], [place] Assistant [phone]

## 2022-11-23 NOTE — Progress Notes (Signed)
Heart and Vascular Care Navigation  11/23/2022  [name] Sep 15, 1950 [id number]  Reason for Referral:  Paramedicine enrollment   Engaged with patient by telephone for initial visit for Heart and Vascular Care Coordination.                                                                                                   Assessment:    CSW called pt to discuss enrollment in [employer].  Pt is agreeable to enrollment at this time.                                   Paramedicine Initial Assessment:  Housing:  In what kind of housing do you live? House/apt/trailer/shelter? house  Do you live with anyone? Lives with his roommate  who is an 73 yo male- they help each other out when they need to.  Are you currently worried about losing your housing? no   Social:  What is your current marital status? widowed  Do you have any children? 3 living 1 deceased  Do you have family or friends who live locally? Two dtrs live about [place], son lives about a mile way- reports they are a great source of support and help with whatever he needs.  Income:  What is your current source of income? 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 yes  Do you have prescription coverage? yes  Transportation:  Do you have transportation to your medical appointments? Children take him to his appts.    Daily Health Needs: Do you have a working scale at home? yes  How do you manage your medications at home? Take them out of a bottle and then set it to the side after he takes- sometimes gets confusing as he has poor eyesight   Do you ever take your medications differently than prescribed? Not intentionally  Do you have issues affording your medications? No- reports its about $100 every three months  Do you have any concerns with mobility at home? No- able to do everything indpendent   Do you use  any assistive devices at home or have PCS at home? Cane and walker when he needs, no aid or home health  Do you have a PCP? Dr. [name]- saw her last week  Do you have any trouble reading or writing? Some trouble reading due to eyesite limitations  Do you currently use tobacco products or have recently quit? Never been a smoker  Do you currently see any mental health providers? No  Any other concerns? Some trouble preparing low sodium food but he is trying- referral made to [employer] to assist   HRT/VAS Care Coordination     Patients Home Cardiology Office [place] [facility]   Outpatient Care Team [facility]; Social Worker   Social Worker Name: Quentin Mulling, [facility] [phone]   Living arrangements for the past 2 months Single Family Home   Lives with:  Roommate   Patient Current Insurance Coverage Managed Medicare   Patient Has Concern With Paying Medical Bills No   Does Patient Have Prescription Coverage? Yes   Home Assistive Devices/Equipment Walker (specify type); Cane (specify quad or straight)   [place] NA       Social History:                                                                             [place]: No Food Insecurity (11/23/2022)  Housing: Low Risk  (11/23/2022)  Transportation Needs: No Transportation Needs (11/23/2022)  Utilities: Not At Risk (11/23/2022)  Alcohol Screen: Low Risk  (11/09/2022)  Depression (PHQ2-9): High Risk (11/09/2022)  Financial Resource Strain: Low Risk  (11/23/2022)  Physical Activity: Inactive (11/09/2022)  Social Connections: Moderately Isolated (11/09/2022)  Stress: No Stress Concern Present (11/09/2022)  Tobacco Use: Low Risk  (11/21/2022)    SDOH Interventions: Financial Resources:  Financial Strain Interventions: Intervention Not Indicated Retirement and Therapist, nutritional pension  Food Insecurity:  Food Insecurity Interventions:  Intervention Not Indicated  Housing Insecurity:  Housing Interventions: Intervention Not Indicated  Transportation:   Transportation Interventions: Intervention Not Indicated    Follow-up plan:    CSW will send out referral to paramedics for assignment and follow up.  Jorge Ny, LCSW Clinical Social Worker [facility] Desk#: [phone] Cell#: [phone]

## 2022-11-24 ENCOUNTER — Telehealth: Payer: Self-pay | Admitting: Pharmacist

## 2022-11-24 DIAGNOSIS — R5383 Other fatigue: Secondary | ICD-10-CM

## 2022-11-24 DIAGNOSIS — R0683 Snoring: Secondary | ICD-10-CM

## 2022-11-24 NOTE — Patient Instructions (Signed)
Visit Information  Phone number for Pharmacist: [phone]  Thank you for meeting with me to discuss your medications! Below is a summary of what we talked about during the visit:   Recommendations/Changes made from today's visit: -Advised to restart Trulicity 4.5 mg weekly; will renew [place] PAP for 2024 -Recommend lipid panel at next OV -Recommend referral for sleep apnea testing  Follow up plan: -Pharmacist follow up televisit scheduled for 6 weeks -Cardiology appt 12/09/22; HF clinic 12/19/22; PCP appt 02/17/23   Charlene Brooke, PharmD, BCACP Clinical Pharmacist [facility] at [facility] [phone]

## 2022-11-24 NOTE — Telephone Encounter (Signed)
Patient has long history of poor sleep and has been referred previously to get a sleep study but never ended up getting it done. Discussed with patient again and he is willing to have a sleep study done now. Routing to PCP for assistance with referral.

## 2022-11-24 NOTE — Telephone Encounter (Signed)
Received signed patient section. Provider section placed in Dr Rometta Emery folder for MD signature.

## 2022-11-24 NOTE — Telephone Encounter (Signed)
Patient came by earlier and completed trulicity assistance forms and dropped off income documents. Forms are placed in Lindsey's box.

## 2022-11-24 NOTE — Telephone Encounter (Cosign Needed)
Spoke with [employer]; patient has been removed from future refills of Ozempic.  Charlene Brooke, PharmD notified  Marijean Niemann, [place] Clinical Pharmacy Assistant [phone]

## 2022-11-29 NOTE — Telephone Encounter (Signed)
Referral placed for sleep apnea evaluation.

## 2022-11-30 ENCOUNTER — Other Ambulatory Visit: Payer: Self-pay | Admitting: Family Medicine

## 2022-12-01 ENCOUNTER — Other Ambulatory Visit (HOSPITAL_COMMUNITY): Payer: Self-pay

## 2022-12-01 ENCOUNTER — Other Ambulatory Visit: Payer: Self-pay | Admitting: Family

## 2022-12-01 MED ORDER — CARVEDILOL 6.25 MG PO TABS: 6.2500 mg | ORAL_TABLET | Freq: Two times a day (BID) | ORAL | 5 refills | Status: DC

## 2022-12-01 NOTE — Progress Notes (Signed)
Paramedicine Encounter    Patient ID: [name], male    DOB: Oct 12, 1950, 72 y.o.   MRN: [id number]  SOCIAL/MEDICAL BARRIERS:  PHARMACY USED -[facility]-delivery service   PCP-[facility]    [employer]   MED ISSUES:  AFFORDABILITY-yes   PT ASSIST APPS NEEDED-trulicity he has assistance   SOURCE OF INCOME-retirement income   TRANSPORTATION-kids take him   FOOD INSECURITIES/NEEDS-sandwiches-had moms meals-"they are trying to get him more meals" FOOD STAMPS-none-too much money   REVIEWED DIET/FLUID/SALT RESTRICTIONS-yes he is aware   RENT/OWN HOME ISSUES-reverse mortgage-no issues at this time   SOCIAL SUPPORT-3 children, lives close by, 1 died 74 yrs ago  SAFETY/DOMESTIC ISSUES-none   SUBSTANCE ABUSE-none   DAILY WEIGHTS-yes   EDUCATE ON DISEASE PROCESS/SYMPTOMS/PURPOSES OF MEDS-yes   Pill box filled-uses bottles  If so, by whom-  Refills needed-have some that will be delivered to him today    Meds verified and reviewed-- His carvedilol bottle says once a day but the list shows BID. I also looked into the previous notes of AHF and she wants him to take it BID.  I called pharmacy to see what they have on file- all they have is the daily. Sent message to Otila Kluver to send over the rx for BID.   He reports he does not miss doses.  No issues or concerns since starting the entresto.  He reports he has low energy and gets tired but he reports this is ongoing for a very long time.  He reported years ago when he had his MI he wasn't taking his meds right-he said he just wasn't taking them b/c he didn't think he needed them and that's what led to his MI.  He knows now that he needs to take his meds.   CBG-172 --fasting--when he woke up this morning his CBG was 91.   Pt denies any c/p. No dizziness. He gets sob if he exerts himself.  If he gets up too fast then he may get a little light headed-uses cane.   He does not sleep well at night., but that is not  related to his breathing. He is able to lay flat if he wants to.  He does have some ankle edema to the rt foot, none to the left-he says he has bad knee and that is the cause of the edema to that foot.  He says he has this happen for a long time now.  Reviewed meds and what they are for.  He has a good understanding of what they are for.  He does not use pill box-he takes from bottles and takes them correctly.  Understands diet/fluid restrictions. He gets out each day to walk yard/mailbox.  No other questions/issues or concerns at this time.  Will f/u next week.   BP 112/62   Pulse 78   Resp 18   SpO2 97%  Weight yesterday-245 Last visit weight-248 @ clinic   Patient Care Team: Jinny Sanders, MD as PCP - General Rockey Situ, Kathlene November, MD as Consulting Physician (Cardiology) Charlton Haws, [facility] as Pharmacist (Pharmacist)  Patient Active Problem List   Diagnosis Date Noted   NSTEMI (non-ST elevated myocardial infarction) 11/05/2022   COVID-19 virus infection 11/05/2022   Penis disorder 05/16/2022   Other fatigue 05/16/2022   Rash 05/12/2022   GAD (generalized anxiety disorder) 09/21/2021   Statin myopathy 03/30/2021   Chronic midline low back pain 11/12/2020   ED (erectile dysfunction) 03/06/2018   Acute on chronic diastolic  CHF (congestive heart failure) 09/20/2016   Adjustment disorder with mixed anxiety and depressed mood 09/09/2016   Gastroesophageal reflux disease 09/09/2016   Elevated left ventricular end-diastolic pressure (LVEDP) 03/11/2016   Anemia 03/11/2016   Hypertensive heart disease with heart failure    Diabetes mellitus type 2 with retinopathy    Coronary artery disease of native artery of native heart with stable angina pectoris    Morbid obesity    Chronic chest pain    Diabetic retinopathy 09/23/2014   Snoring 12/21/2012   HYPOGONADISM 09/28/2010   B12 deficiency 09/28/2010   Vitamin D deficiency 09/28/2010   OTHER MALAISE AND FATIGUE 09/17/2010    TRIGGER FINGER, RIGHT MIDDLE 09/14/2010   BRANCH RETINAL VEIN OCCLUSION 11/26/2009   CONSTIPATION 08/10/2009   GASTRITIS 07/29/2009   Major depressive disorder, recurrent episode, moderate ([place]) 07/06/2007   RENAL CALCULUS, HX OF 03/26/2007   Hyperlipidemia associated with type 2 diabetes mellitus 12/05/2006   Hypertension associated with diabetes 12/05/2006   Osteoarthritis 12/05/2006    Current Outpatient Medications:    Acetaminophen (TYLENOL PO), Take 3-4 tablets by mouth as needed (pain)., Disp: , Rfl:    aspirin 81 MG EC tablet, Take 1 tablet (81 mg total) by mouth daily., Disp: , Rfl:    cholecalciferol (VITAMIN D3) 25 MCG (1000 UNIT) tablet, Take 2,000 Units by mouth daily., Disp: , Rfl:    clopidogrel (PLAVIX) 75 MG tablet, TAKE 1 TABLET BY MOUTH ONCE DAILY WITH BREAKFAST (Patient taking differently: Take 75 mg by mouth daily.), Disp: 90 tablet, Rfl: 3   Continuous Blood Gluc Sensor (FREESTYLE LIBRE 2 SENSOR) MISC, APPLY SENSOR EVERY 14 DAYS TO MONITOR SUGAR CONTINOUSLY, Disp: 2 each, Rfl: 11   Dulaglutide (TRULICITY) 4.5 0000000 SOPN, Inject 4.5 mg into the skin once a week. Via [employer] PAP, Disp: , Rfl:    empagliflozin (JARDIANCE) 10 MG TABS tablet, Take 1 tablet (10 mg total) by mouth daily., Disp: 30 tablet, Rfl: 5   ezetimibe (ZETIA) 10 MG tablet, TAKE 1 TABLET BY MOUTH ONCE DAILY (Patient taking differently: Take 10 mg by mouth daily.), Disp: 30 tablet, Rfl: 2   furosemide (LASIX) 40 MG tablet, Take 1 tablet (40 mg total) by mouth daily., Disp: 30 tablet, Rfl: 2   HYDROcodone-acetaminophen (NORCO) 5-325 MG tablet, Take 1-2 tablets by mouth daily as needed for moderate pain., Disp: 60 tablet, Rfl: 0   hydrOXYzine (ATARAX) 10 MG tablet, TAKE 1 TABLET BY MOUTH 3 TIMES DAILY AS NEEDED FOR ANXIETY (Patient taking differently: Take 10 mg by mouth 3 (three) times daily as needed for anxiety.), Disp: 30 tablet, Rfl: 2   insulin aspart (NOVOLOG FLEXPEN) 100 UNIT/ML FlexPen, 13  units in AM (scheduled) and 3 units PRN in evening (Patient taking differently: Inject 3-13 Units into the skin in the morning and at bedtime. Inject 13 units in the morning and 3 units at night), Disp: 15 mL, Rfl: 6   isosorbide mononitrate (IMDUR) 30 MG 24 hr tablet, Take 1 tablet (30 mg total) by mouth 2 (two) times daily., Disp: 60 tablet, Rfl: 2   sacubitril-valsartan (ENTRESTO) 24-26 MG, Take 1 tablet by mouth 2 (two) times daily., Disp: 60 tablet, Rfl: 3   spironolactone (ALDACTONE) 25 MG tablet, Take 1 tablet (25 mg total) by mouth daily., Disp: 30 tablet, Rfl: 5   TRESIBA FLEXTOUCH 100 UNIT/ML FlexTouch Pen, INJECT 50 UNITS INTO THE SKIN DAILY, Disp: 15 mL, Rfl: 2   triamcinolone cream (KENALOG) 0.5 %, Apply 1 Application topically  2 (two) times daily., Disp: 30 g, Rfl: 0   TRUEPLUS 5-BEVEL PEN NEEDLES 31G X 6 MM MISC, USE TO INJECT INSULIN THREE TIMES A DAY, Disp: 300 each, Rfl: 3   venlafaxine XR (EFFEXOR-XR) 150 MG 24 hr capsule, TAKE 1 CAPSULE BY MOUTH DAILY WITH BREAKFAST. TAKE WITH EFFEXOR XR 75MG  FORA TOTAL OF 225MG  (Patient taking differently: Take 150 mg by mouth daily with breakfast. Take with 75 mg), Disp: 90 capsule, Rfl: 0   vitamin B-12 (CYANOCOBALAMIN) 1000 MCG tablet, Take 1,000 mcg by mouth daily., Disp: , Rfl:    carvedilol (COREG) 6.25 MG tablet, Take 1 tablet (6.25 mg total) by mouth 2 (two) times daily with a meal., Disp: 60 tablet, Rfl: 5   guaiFENesin-dextromethorphan (ROBITUSSIN DM) 100-10 MG/5ML syrup, Take 5 mLs by mouth every 4 (four) hours as needed for cough. (Patient not taking: Reported on 12/01/2022), Disp: 118 mL, Rfl: 0   [START ON 01/17/2023] HYDROcodone-acetaminophen (NORCO) 5-325 MG tablet, Take 1-2 tablets by mouth daily as needed for moderate pain. May (Patient not taking: Reported on 12/01/2022), Disp: 60 tablet, Rfl: 0   [START ON 12/18/2022] HYDROcodone-acetaminophen (NORCO) 5-325 MG tablet, Take 1-2 tablets by mouth daily as needed for moderate pain. April  (Patient not taking: Reported on 12/01/2022), Disp: 60 tablet, Rfl: 0   nitroGLYCERIN (NITROSTAT) 0.4 MG SL tablet, DISSOLVE 1 TABLET UNDER TONGUE AS NEEDEDFOR CHEST PAIN. MAY REPEAT 5 MINUTES APART 3 TIMES IF NEEDED (Patient not taking: Reported on 12/01/2022), Disp: 25 tablet, Rfl: 3 Allergies  Allergen Reactions   Bupropion Nausea Only   Ozempic (0.25 Or 0.5 Mg-Dose) [Semaglutide(0.25 Or 0.5mg -Dos)] Nausea And Vomiting   Atorvastatin Other (See Comments)    Body aches Similar effect with rosuvastatin 40 mg twice weekly      Social History   Socioeconomic History   Marital status: Widowed    Spouse name: Not on file   Number of children: 3   Years of education: Not on file   Highest education level: 7th grade  Occupational History   Occupation: disabled    Fish farm manager: UNEMPLOYED    Comment: back injury  Tobacco Use   Smoking status: Never   Smokeless tobacco: Never  Vaping Use   Vaping Use: Never used  Substance and Sexual Activity   Alcohol use: No    Alcohol/week: 0.0 standard drinks of alcohol   Drug use: No   Sexual activity: Not Currently  Other Topics Concern   Not on file  Social History Narrative   Financial concerns, wife with depression.   Minimal exercise.   Diet: poor.   Social Determinants of Health   Financial Resource Strain: Low Risk  (11/23/2022)   Overall Financial Resource Strain (CARDIA)    Difficulty of Paying Living Expenses: Not hard at all  Food Insecurity: No Food Insecurity (11/23/2022)   Hunger Vital Sign    Worried About Running Out of Food in the Last Year: Never true    Ran Out of Food in the Last Year: Never true  Transportation Needs: No Transportation Needs (11/23/2022)   PRAPARE - Hydrologist (Medical): No    Lack of Transportation (Non-Medical): No  Physical Activity: Inactive (11/09/2022)   Exercise Vital Sign    Days of Exercise per Week: 0 days    Minutes of Exercise per Session: 0 min  Stress: No  Stress Concern Present (11/09/2022)   Angie  of Stress : Only a little  Social Connections: Moderately Isolated (11/09/2022)   Social Connection and Isolation Panel [NHANES]    Frequency of Communication with Friends and Family: More than three times a week    Frequency of Social Gatherings with Friends and Family: More than three times a week    Attends Religious Services: More than 4 times per year    Active Member of [employer] or Organizations: No    Attends Archivist Meetings: Never    Marital Status: Widowed  Intimate Partner Violence: Not At Risk (11/09/2022)   Humiliation, Afraid, Rape, and Kick questionnaire    Fear of Current or Ex-Partner: No    Emotionally Abused: No    Physically Abused: No    Sexually Abused: No    Physical Exam      Future Appointments  Date Time Provider [place]  12/19/2022  2:30 PM Alisa Graff, FNP [facility] None  12/26/2022  3:10 PM Gerrie Nordmann, NP [facility] None  01/02/2023  3:00 PM Charlton Haws, [facility] None  02/17/2023  3:00 PM Jinny Sanders, MD [facility]  11/14/2023 10:45 AM [facility] ANNUAL WELLNESS VISIT 1 [facility] [place], Paramedic [place] Paramedic  12/01/22

## 2022-12-06 ENCOUNTER — Telehealth (HOSPITAL_COMMUNITY): Payer: Self-pay

## 2022-12-06 ENCOUNTER — Telehealth: Payer: Self-pay

## 2022-12-06 NOTE — Progress Notes (Cosign Needed)
Contacted the patient with approval for Trulicity and informed the patient the medication will be mailed to his residence.    [name], [facility] Clinical Pharmacy Assistant [phone]

## 2022-12-06 NOTE — Progress Notes (Signed)
Contacted the patient and informed of approval for Trulicity  thru December 2024. The medication will be mailed to his residence. The patient expressed appreciation.  Al Corpus, PharmD notified  Burt Knack, [place] Clinical Pharmacy Assistant [phone]

## 2022-12-06 NOTE — Telephone Encounter (Signed)
I called pt to touch base this week so see how things were going.  He had things squared away last weeks appointment.  Only thing that was changed was his carvedilol >> BID-it was a mistake on his rx and tina sent in new rx for him.  He denies any issues or complaints.  No dizziness, no c/p. No increased sob.  He reports they are going out of town this wknd and will be back Monday afternoon..  I will f/u with him after that and make home visit next week.    Kerry Hough, EMT-Paramedic  [phone] 12/06/2022

## 2022-12-06 NOTE — Telephone Encounter (Signed)
Per [name], Trulicity PAP has been approved through 08/29/23. Medication will be shipped to patient's home.

## 2022-12-09 ENCOUNTER — Ambulatory Visit: Payer: HMO | Admitting: Cardiology

## 2022-12-14 ENCOUNTER — Other Ambulatory Visit (HOSPITAL_COMMUNITY): Payer: Self-pay

## 2022-12-14 NOTE — Progress Notes (Signed)
Paramedicine Encounter    Patient ID: [name], male    DOB: [birth year], 72 y.o.   MRN: [id number]   Complaints-none  Edema-legs  Compliance with meds-yes  Pill box filled-does not use  If so, by whom-n/a  Refills needed-none  Pt reports he is doing well. [place] days and he did put on some weight on vacation.  He denies any increased sob, no dizziness, no c/p.  He feels like his tiredness is getting better.   No appointments/changes since I seen him last.  He is good on all his meds.  Does not use pill box-manages own meds via bottles.  Legs little swollen-increased food intake while out of town. He is getting back on track.    BP 130/70   Pulse 77   Resp 16   Wt 252 lb (114.3 kg)   SpO2 95%   BMI 40.67 kg/m  Weight yesterday-? Last visit weight-248  Patient Care Team: [name], MD as PCP - General Mariah Milling Tollie Pizza, MD as Consulting Physician (Cardiology) Kathyrn Sheriff, [facility] as Pharmacist (Pharmacist)  Patient Active Problem List   Diagnosis Date Noted   NSTEMI (non-ST elevated myocardial infarction) 11/05/2022   COVID-19 virus infection 11/05/2022   Penis disorder 05/16/2022   Other fatigue 05/16/2022   Rash 05/12/2022   GAD (generalized anxiety disorder) 09/21/2021   Statin myopathy 03/30/2021   Chronic midline low back pain 11/12/2020   ED (erectile dysfunction) 03/06/2018   Acute on chronic diastolic CHF (congestive heart failure) 09/20/2016   Adjustment disorder with mixed anxiety and depressed mood 09/09/2016   Gastroesophageal reflux disease 09/09/2016   Elevated left ventricular end-diastolic pressure (LVEDP) 03/11/2016   Anemia 03/11/2016   Hypertensive heart disease with heart failure    Diabetes mellitus type 2 with retinopathy    Coronary artery disease of native artery of native heart with stable angina pectoris    Morbid obesity    Chronic chest pain    Diabetic retinopathy  09/23/2014   Snoring 12/21/2012   HYPOGONADISM 09/28/2010   B12 deficiency 09/28/2010   Vitamin D deficiency 09/28/2010   OTHER MALAISE AND FATIGUE 09/17/2010   TRIGGER FINGER, RIGHT MIDDLE 09/14/2010   BRANCH RETINAL VEIN OCCLUSION 11/26/2009   CONSTIPATION 08/10/2009   GASTRITIS 07/29/2009   Major depressive disorder, recurrent episode, moderate (HCC) 07/06/2007   RENAL CALCULUS, HX OF 03/26/2007   Hyperlipidemia associated with type 2 diabetes mellitus 12/05/2006   Hypertension associated with diabetes 12/05/2006   Osteoarthritis 12/05/2006    Current Outpatient Medications:    Acetaminophen (TYLENOL PO), Take 3-4 tablets by mouth as needed (pain)., Disp: , Rfl:    aspirin 81 MG EC tablet, Take 1 tablet (81 mg total) by mouth daily., Disp: , Rfl:    carvedilol (COREG) 6.25 MG tablet, Take 1 tablet (6.25 mg total) by mouth 2 (two) times daily with a meal., Disp: 60 tablet, Rfl: 5   cholecalciferol (VITAMIN D3) 25 MCG (1000 UNIT) tablet, Take 2,000 Units by mouth daily., Disp: , Rfl:    clopidogrel (PLAVIX) 75 MG tablet, TAKE 1 TABLET BY MOUTH ONCE DAILY WITH BREAKFAST (Patient taking differently: Take 75 mg by mouth daily.), Disp: 90 tablet, Rfl: 3   Continuous Blood Gluc Sensor (FREESTYLE LIBRE 2 SENSOR) MISC, APPLY SENSOR EVERY 14 DAYS TO MONITOR SUGAR CONTINOUSLY, Disp: 2 each, Rfl: 11   Dulaglutide (TRULICITY) 4.5 MG/0.5ML SOPN, Inject 4.5 mg into the skin  once a week. Via [name] PAP, Disp: , Rfl:    empagliflozin (JARDIANCE) 10 MG TABS tablet, Take 1 tablet (10 mg total) by mouth daily., Disp: 30 tablet, Rfl: 5   ezetimibe (ZETIA) 10 MG tablet, TAKE 1 TABLET BY MOUTH ONCE DAILY (Patient taking differently: Take 10 mg by mouth daily.), Disp: 30 tablet, Rfl: 2   furosemide (LASIX) 40 MG tablet, Take 1 tablet (40 mg total) by mouth daily., Disp: 30 tablet, Rfl: 2   guaiFENesin-dextromethorphan (ROBITUSSIN DM) 100-10 MG/5ML syrup, Take 5 mLs by mouth every 4 (four) hours as needed  for cough., Disp: 118 mL, Rfl: 0   HYDROcodone-acetaminophen (NORCO) 5-325 MG tablet, Take 1-2 tablets by mouth daily as needed for moderate pain., Disp: 60 tablet, Rfl: 0   [START ON 01/17/2023] HYDROcodone-acetaminophen (NORCO) 5-325 MG tablet, Take 1-2 tablets by mouth daily as needed for moderate pain. May, Disp: 60 tablet, Rfl: 0   [START ON 12/18/2022] HYDROcodone-acetaminophen (NORCO) 5-325 MG tablet, Take 1-2 tablets by mouth daily as needed for moderate pain. April, Disp: 60 tablet, Rfl: 0   hydrOXYzine (ATARAX) 10 MG tablet, TAKE 1 TABLET BY MOUTH 3 TIMES DAILY AS NEEDED FOR ANXIETY (Patient taking differently: Take 10 mg by mouth 3 (three) times daily as needed for anxiety.), Disp: 30 tablet, Rfl: 2   insulin aspart (NOVOLOG FLEXPEN) 100 UNIT/ML FlexPen, 13 units in AM (scheduled) and 3 units PRN in evening (Patient taking differently: Inject 3-13 Units into the skin in the morning and at bedtime. Inject 13 units in the morning and 3 units at night), Disp: 15 mL, Rfl: 6   isosorbide mononitrate (IMDUR) 30 MG 24 hr tablet, Take 1 tablet (30 mg total) by mouth 2 (two) times daily., Disp: 60 tablet, Rfl: 2   nitroGLYCERIN (NITROSTAT) 0.4 MG SL tablet, DISSOLVE 1 TABLET UNDER TONGUE AS NEEDEDFOR CHEST PAIN. MAY REPEAT 5 MINUTES APART 3 TIMES IF NEEDED, Disp: 25 tablet, Rfl: 3   sacubitril-valsartan (ENTRESTO) 24-26 MG, Take 1 tablet by mouth 2 (two) times daily., Disp: 60 tablet, Rfl: 3   spironolactone (ALDACTONE) 25 MG tablet, Take 1 tablet (25 mg total) by mouth daily., Disp: 30 tablet, Rfl: 5   TRESIBA FLEXTOUCH 100 UNIT/ML FlexTouch Pen, INJECT 50 UNITS INTO THE SKIN DAILY, Disp: 15 mL, Rfl: 2   triamcinolone cream (KENALOG) 0.5 %, Apply 1 Application topically 2 (two) times daily., Disp: 30 g, Rfl: 0   TRUEPLUS 5-BEVEL PEN NEEDLES 31G X 6 MM MISC, USE TO INJECT INSULIN THREE TIMES A DAY, Disp: 300 each, Rfl: 3   venlafaxine XR (EFFEXOR-XR) 150 MG 24 hr capsule, TAKE 1 CAPSULE BY MOUTH DAILY  WITH BREAKFAST. TAKE WITH EFFEXOR XR  FORA TOTAL OF  (Patient taking differently: Take 150 mg by mouth daily with breakfast. Take with 75 mg), Disp: 90 capsule, Rfl: 0   vitamin B-12 (CYANOCOBALAMIN) 1000 MCG tablet, Take 1,000 mcg by mouth daily., Disp: , Rfl:  Allergies  Allergen Reactions   Bupropion Nausea Only   Ozempic (0.25 Or 0.5 Mg-Dose) [Semaglutide(0.25 Or 0.5mg -Dos)] Nausea And Vomiting   Atorvastatin Other (See Comments)    Body aches Similar effect with rosuvastatin 40 mg twice weekly      Social History   Socioeconomic History   Marital status: Widowed    Spouse name: Not on file   Number of children: 3   Years of education: Not on file   Highest education level: 7th grade  Occupational History   Occupation: disabled  Employer: UNEMPLOYED    Comment: back injury  Tobacco Use   Smoking status: Never   Smokeless tobacco: Never  Vaping Use   Vaping Use: Never used  Substance and Sexual Activity   Alcohol use: No    Alcohol/week: 0.0 standard drinks of alcohol   Drug use: No   Sexual activity: Not Currently  Other Topics Concern   Not on file  Social History Narrative   Financial concerns, wife with depression.   Minimal exercise.   Diet: poor.   Social Determinants of Health   Financial Resource Strain: Low Risk  (11/23/2022)   Overall Financial Resource Strain (CARDIA)    Difficulty of Paying Living Expenses: Not hard at all  Food Insecurity: No Food Insecurity (11/23/2022)   Hunger Vital Sign    Worried About Running Out of Food in the Last Year: Never true    Ran Out of Food in the Last Year: Never true  Transportation Needs: No Transportation Needs (11/23/2022)   PRAPARE - Administrator, Civil Service (Medical): No    Lack of Transportation (Non-Medical): No  Physical Activity: Inactive (11/09/2022)   Exercise Vital Sign    Days of Exercise per Week: 0 days    Minutes of Exercise per Session: 0 min  Stress: No Stress  Concern Present (11/09/2022)   [employer] of Occupational Health - Occupational Stress Questionnaire    Feeling of Stress : Only a little  Social Connections: Moderately Isolated (11/09/2022)   Social Connection and Isolation Panel [NHANES]    Frequency of Communication with Friends and Family: More than three times a week    Frequency of Social Gatherings with Friends and Family: More than three times a week    Attends Religious Services: More than 4 times per year    Active Member of [employer] or Organizations: No    Attends Banker Meetings: Never    Marital Status: Widowed  Intimate Partner Violence: Not At Risk (11/09/2022)   Humiliation, Afraid, Rape, and Kick questionnaire    Fear of Current or Ex-Partner: No    Emotionally Abused: No    Physically Abused: No    Sexually Abused: No    Physical Exam      Future Appointments  Date Time Provider Department Center  12/19/2022  2:30 PM Delma Freeze, FNP [facility] None  12/26/2022  3:10 PM Charlsie Quest, NP [facility] None  01/02/2023  3:00 PM Kathyrn Sheriff, [facility] None  02/17/2023  3:00 PM [name], MD [facility]  11/14/2023 10:45 AM [facility] ANNUAL WELLNESS VISIT 1 [facility]       Kerry Hough, Paramedic [phone] [facility] Paramedic  12/14/22

## 2022-12-18 NOTE — Progress Notes (Unsigned)
Patient ID: [name], male    DOB: [birth year], 72 y.o.   MRN: [id number]  Primary cardiologist: William Nordmann, [name] (last seen 06/23; returns next week) PCP: William Seltzer, [name] (last seen 03/24)  HPI  [name] is [name] 72 y/o male with [name] history of CAD, DM, hyperlipidemia, HTN, CKD, depression and chronic heart failure.   Echo 11/06/22: EF 40-45% along with mild/moderate William  LHC 04/06/16:  Mid RCA to Dist RCA lesion, 100 %stenosed. Mid LAD-2 lesion, 60 %stenosed. Mid LAD-1 lesion, 20 %stenosed. The left ventricular systolic function is normal. The left ventricular ejection fraction is 50-55% by visual estimate. LV end diastolic pressure is normal. 2nd [name] lesion, 75 %stenosed.  Chronic total occlusion of the right coronary in the proximal segment well collateralized from the left circumflex and LAD. Widely patent proximal to mid LAD stent previously placed in July. There is first diagonal diagonal and LAD 30 and 50% narrowing respectively. Widely patent circumflex unchanged from previous with 70% narrowing in [name] small branch of the first marginal. Circumflex collateralizes the distal right coronary left ventricular branch. Inferobasal hypokinesis. EF 50%. EDP is normal.  Admitted 11/05/22 due to nausea, vomiting and dyspnea for the last 3 days. Noted to have fever and tested covid +. CTA negative for PE but showed heart failure. 1 dose of IV lasix given. Started on remdesivir, Decadron and bronchodilators.   He presents today for [name] HF f/u visit with [name] chief complaint of moderate fatigue with minimal exertion. Chronic in nature. Has associated SOB, difficulty sleeping and gradual weight gain along with this. Denies dizziness, abdominal distention, palpitations, pedal edema, chest pain or cough.   Losartan stopped and entresto started at last visit here and he says that his fatigue may be [name] little bit improved.   Participating in paramedicine program and was last seen by her 12/14/22. Went to  [employer] [place] with his family and says that he ate "all the time" and that is where he gained his weight. Now has been home and back on his routine.   Past Medical History:  Diagnosis Date   Back injury 02/2002   worker's comp   CHF (congestive heart failure) (HCC)    Coronary artery disease, non-occlusive    [name]. cath 2002 with no sig CAD;  b. cath 2008 normal LM, LAD, LCx, p&dRCA 20-30%, PDA 30%; c.11/2015 NSTEMI/PCI: LM nl, LAD 95p (2.5x15 Xience DES), LCX nl, RCA 100p/m w/ L->R collats, EF 55-65% c. NSTEMI (02/2016) with no culprit leision, switched to Brilinta.  d. NSTEMI 03/2016: again, no culprit lesion and switched back to plavix 2/2 SOB with Brilnta.     Depression    Diabetes mellitus type 2, insulin dependent (HCC)    Hyperlipemia    Hypertension    Hypertensive heart disease    Kidney stones    Morbid obesity (HCC)    Osteoarthritis    Snoring    Past Surgical History:  Procedure Laterality Date   CARDIAC CATHETERIZATION  09/29/2000   diffuse LAD 30% LCA  EF 50-60%   CARDIAC CATHETERIZATION  06/30/2007   no significant CAD   CARDIAC CATHETERIZATION N/[name] 12/25/2015   Procedure: Left Heart Cath and Coronary Angiography;  Surgeon: William Iba, [name];  Location: [facility] INVASIVE CV LAB;  Service: Cardiovascular;  Laterality: N/[name];   CARDIAC CATHETERIZATION N/[name] 12/25/2015   Procedure: Coronary Stent Intervention;  Surgeon: William Pea, [name];  Location: [facility] INVASIVE CV LAB;  Service: Cardiovascular;  Laterality: N/[name];  CARDIAC CATHETERIZATION N/[name] 03/10/2016   Procedure: Left Heart Cath and Coronary Angiography;  Surgeon: William Ouch, [name];  Location: [facility] INVASIVE CV LAB;  Service: Cardiovascular;  Laterality: N/[name];   CARDIAC CATHETERIZATION N/[name] 04/06/2016   Procedure: Left Heart Cath and Coronary Angiography;  Surgeon: William Records, [name];  Location: [facility] INVASIVE CV LAB;  Service: Cardiovascular;  Laterality: N/[name];   CIRCUMCISION     CORONARY ARTERY BYPASS GRAFT     Family  History  Problem Relation Age of Onset   Alzheimer's disease Mother    Emphysema Mother    Diabetes Father    Heart disease Father        MI   Cancer Brother        ? Neck cancer   Social History   Tobacco Use   Smoking status: Never   Smokeless tobacco: Never  Substance Use Topics   Alcohol use: No    Alcohol/week: 0.0 standard drinks of alcohol   Allergies  Allergen Reactions   Bupropion Nausea Only   Ozempic (0.25 Or 0.5 Mg-Dose) [Semaglutide(0.25 Or 0.5mg -Dos)] Nausea And Vomiting   Atorvastatin Other (See Comments)    Body aches Similar effect with rosuvastatin 40 mg twice weekly   Prior to Admission medications   Medication Sig Start Date End Date Taking? Authorizing Provider  Acetaminophen (TYLENOL PO) Take 3-4 tablets by mouth as needed (pain).   Yes Provider, Historical, [name]  aspirin 81 MG EC tablet Take 1 tablet (81 mg total) by mouth daily. 01/12/16  Yes William Hines, NP  carvedilol (COREG) 6.25 MG tablet Take 1 tablet (6.25 mg total) by mouth 2 (two) times daily with [name] meal. 12/01/22  Yes [name]  cholecalciferol (VITAMIN D3) 25 MCG (1000 UNIT) tablet Take 2,000 Units by mouth daily.   Yes Provider, Historical, [name]  clopidogrel (PLAVIX) 75 MG tablet TAKE 1 TABLET BY MOUTH ONCE DAILY WITH BREAKFAST Patient taking differently: Take 75 mg by mouth daily. 01/03/22  Yes [name]  Continuous Blood Gluc Sensor (FREESTYLE LIBRE 2 SENSOR) MISC APPLY SENSOR EVERY 14 DAYS TO MONITOR SUGAR CONTINOUSLY 11/03/22  Yes [name]  Dulaglutide (TRULICITY) 4.5 MG/0.5ML SOPN Inject 4.5 mg into the skin once [name] week. Via [employer] PAP 11/23/22  Yes [name]  empagliflozin (JARDIANCE) 10 MG TABS tablet Take 1 tablet (10 mg total) by mouth daily. 11/21/22  Yes [name]  ezetimibe (ZETIA) 10 MG tablet TAKE 1 TABLET BY MOUTH ONCE DAILY Patient taking differently: Take 10 mg by mouth daily. 10/12/22  Yes [name], William Pizza, [name]   furosemide (LASIX) 40 MG tablet Take 1 tablet (40 mg total) by mouth daily. 11/09/22 02/07/23 Yes [name], William Schools, [name]  HYDROcodone-acetaminophen (NORCO) 5-325 MG tablet Take 1-2 tablets by mouth daily as needed for moderate pain. May 01/17/23  Yes [name]  hydrOXYzine (ATARAX) 10 MG tablet TAKE 1 TABLET BY MOUTH 3 TIMES DAILY AS NEEDED FOR ANXIETY Patient taking differently: Take 10 mg by mouth 3 (three) times daily as needed for anxiety. 10/12/22  Yes [name]  insulin aspart (NOVOLOG FLEXPEN) 100 UNIT/ML FlexPen 13 units in AM (scheduled) and 3 units PRN in evening Patient taking differently: Inject 3-13 Units into the skin in the morning and at bedtime. Inject 13 units in the morning and 3 units at night 04/03/22  Yes [name]  isosorbide mononitrate (IMDUR) 30 MG 24 hr tablet  Take 1 tablet (30 mg total) by mouth 2 (two) times daily. 11/08/22 02/06/23 Yes [name], William Schools, [name]  sacubitril-valsartan (ENTRESTO) 24-26 MG Take 1 tablet by mouth 2 (two) times daily. 11/21/22  Yes [name]  spironolactone (ALDACTONE) 25 MG tablet Take 1 tablet (25 mg total) by mouth daily. 11/21/22  Yes Mikeisha Lemonds [name], [name]  TRESIBA FLEXTOUCH 100 UNIT/ML FlexTouch Pen INJECT 50 UNITS INTO THE SKIN DAILY 11/03/22  Yes [name]  triamcinolone cream (KENALOG) 0.5 % Apply 1 Application topically 2 (two) times daily. 11/17/22  Yes [name]  TRUEPLUS 5-BEVEL PEN NEEDLES 31G X 6 MM MISC USE TO INJECT INSULIN THREE TIMES [name] DAY 11/30/22  Yes [name]  venlafaxine XR (EFFEXOR-XR) 150 MG 24 hr capsule TAKE 1 CAPSULE BY MOUTH DAILY WITH BREAKFAST. TAKE WITH EFFEXOR XR 75MG  FORA TOTAL OF 225MG  Patient taking differently: Take 150 mg by mouth daily with breakfast. Take with 75 mg 10/03/22  Yes [name]  vitamin B-12 (CYANOCOBALAMIN) 1000 MCG tablet Take 1,000 mcg by mouth daily.   Yes Provider, Historical, [name]  guaiFENesin-dextromethorphan (ROBITUSSIN DM) 100-10 MG/5ML syrup  Take 5 mLs by mouth every 4 (four) hours as needed for cough. Patient not taking: Reported on 12/19/2022 11/08/22   Lorin Glass, [name]  HYDROcodone-acetaminophen (NORCO) 5-325 MG tablet Take 1-2 tablets by mouth daily as needed for moderate pain. 11/17/22   [name]  HYDROcodone-acetaminophen (NORCO) 5-325 MG tablet Take 1-2 tablets by mouth daily as needed for moderate pain. April 12/18/22   [name]  nitroGLYCERIN (NITROSTAT) 0.4 MG SL tablet DISSOLVE 1 TABLET UNDER TONGUE AS NEEDEDFOR CHEST PAIN. MAY REPEAT 5 MINUTES APART 3 TIMES IF NEEDED 02/25/21   William Iba, [name]   Review of Systems  Constitutional:  Positive for fatigue (easily). Negative for appetite change.  HENT:  Negative for congestion, postnasal drip and sore throat.   Eyes:  Positive for visual disturbance.  Respiratory:  Positive for shortness of breath. Negative for cough and chest tightness.   Cardiovascular:  Negative for chest pain, palpitations and leg swelling.  Gastrointestinal:  Negative for abdominal distention and abdominal pain.  Endocrine: Negative.   Genitourinary: Negative.   Musculoskeletal:  Negative for back pain and neck pain.  Skin: Negative.   Allergic/Immunologic: Negative.   Neurological:  Negative for dizziness and light-headedness.  Hematological:  Negative for adenopathy. Does not bruise/bleed easily.  Psychiatric/Behavioral:  Positive for dysphoric mood and sleep disturbance (chronic difficulty sleeping). The patient is not nervous/anxious.    Vitals:   12/19/22 1421  BP: 137/84  Pulse: 75  SpO2: 99%  Weight: 255 lb (115.7 kg)   Wt Readings from Last 3 Encounters:  12/19/22 255 lb (115.7 kg)  12/14/22 252 lb (114.3 kg)  11/21/22 248 lb 6 oz (112.7 kg)   Lab Results  Component Value Date   CREATININE 1.03 11/21/2022   CREATININE 1.06 11/08/2022   CREATININE 1.09 11/07/2022   Physical Exam Vitals and nursing note reviewed. Exam conducted with [name] chaperone present  (daughter).  Constitutional:      Appearance: Normal appearance.  HENT:     Head: Normocephalic and atraumatic.  Cardiovascular:     Rate and Rhythm: Normal rate and regular rhythm.  Pulmonary:     Effort: Pulmonary effort is normal. No respiratory distress.     Breath sounds: No wheezing, rhonchi or rales.  Abdominal:     General: There  is no distension.     Palpations: Abdomen is soft.     Tenderness: There is no abdominal tenderness.  Musculoskeletal:        General: No tenderness.     Cervical back: Normal range of motion and neck supple.     Right lower leg: No edema.     Left lower leg: No edema.  Skin:    General: Skin is warm and dry.  Neurological:     General: No focal deficit present.     Mental Status: He is alert and oriented to person, place, and time.  Psychiatric:        Mood and Affect: Mood normal.        Behavior: Behavior normal.        Thought Content: Thought content normal.   Assessment & Plan:  1: Ischemic cardiomyopathy with reduced ejection fraction- - NYHA class III - euvolemic today - weighing daily; reminded to call for an overnight weight gain of > 2 pounds or [name] weekly weight gain of > 5 pounds - weight up 7 pounds from last visit here 1 month ago; patient says this has been gradual since he [place] - echo 11/06/22: EF 40-45% along with mild/moderate William - last cath was 2017; discussed the possible need of repeating this; they will discuss further at upcoming cardiology appt - NAS but does like to eat out at [employer]; eating out less often now than he used to and we reviewed how to make better sodium choices when he eats out - discussed keeping his daily fluid intake to 60-64 oz - continue carvedilol 6.25mg  BID - continue jardiance  daily - continue furosemide  daily - continue entresto 24/26mg  BID; consider titrating this at his next visit - continue spironolactone  daily - encouraged  to be active but to allow for rest periods when he feels fatigued/ SOB - BMP today since he started entresto at his last visit - participating in paramedicine program  - BNP 11/05/22 was 1240.5  2: HTN- - BP 137/84 - saw PCP Ermalene Searing) 03/24 - BMP 11/21/22 showed sodium 142, potassium 4.6, creatinine 1.03 & GFR >60  3: Diabetes- - A1c 11/17/22 was 7.2%  4: CAD- - saw cardiology Mariah Milling) 06/23 - LHC 04/06/16:  Mid RCA to Dist RCA lesion, 100 %stenosed. Mid LAD-2 lesion, 60 %stenosed. Mid LAD-1 lesion, 20 %stenosed. The left ventricular systolic function is normal. The left ventricular ejection fraction is 50-55% by visual estimate. LV end diastolic pressure is normal. 2nd [name] lesion, 75 %stenosed.  Chronic total occlusion of the right coronary in the proximal segment well collateralized from the left circumflex and LAD.  Return in 1 month, sooner if needed.

## 2022-12-19 ENCOUNTER — Other Ambulatory Visit
Admission: RE | Admit: 2022-12-19 | Discharge: 2022-12-19 | Disposition: A | Discharge status: Home / Self Care | Payer: PPO | Source: Ambulatory Visit | Attending: Family | Admitting: Family

## 2022-12-19 ENCOUNTER — Ambulatory Visit (HOSPITAL_BASED_OUTPATIENT_CLINIC_OR_DEPARTMENT_OTHER): Payer: PPO | Admitting: Family

## 2022-12-19 ENCOUNTER — Encounter: Payer: Self-pay | Admitting: Family

## 2022-12-19 VITALS — BP 137/84 | HR 75 | Wt 255.0 lb

## 2022-12-19 DIAGNOSIS — I10 Essential (primary) hypertension: Secondary | ICD-10-CM

## 2022-12-19 DIAGNOSIS — I2510 Atherosclerotic heart disease of native coronary artery without angina pectoris: Secondary | ICD-10-CM

## 2022-12-19 DIAGNOSIS — Z794 Long term (current) use of insulin: Secondary | ICD-10-CM | POA: Diagnosis not present

## 2022-12-19 DIAGNOSIS — E11319 Type 2 diabetes mellitus with unspecified diabetic retinopathy without macular edema: Secondary | ICD-10-CM

## 2022-12-19 DIAGNOSIS — I5022 Chronic systolic (congestive) heart failure: Secondary | ICD-10-CM | POA: Insufficient documentation

## 2022-12-19 LAB — BASIC METABOLIC PANEL
Anion gap: 7 (ref 5–15)
BUN: 27 mg/dL — ABNORMAL HIGH (ref 8–23)
CO2: 27 mmol/L (ref 22–32)
Calcium: 8.8 mg/dL — ABNORMAL LOW (ref 8.9–10.3)
Chloride: 104 mmol/L (ref 98–111)
Creatinine, Ser: 1 mg/dL (ref 0.61–1.24)
GFR, Estimated: >60 mL/min (ref >60)
Glucose, Bld: 149 mg/dL — ABNORMAL HIGH (ref 70–99)
Potassium: 4.3 mmol/L (ref 3.5–5.1)
Sodium: 138 mmol/L (ref 135–145)

## 2022-12-21 ENCOUNTER — Telehealth (HOSPITAL_COMMUNITY): Payer: Self-pay | Admitting: Licensed Clinical Social Worker

## 2022-12-21 DIAGNOSIS — I5022 Chronic systolic (congestive) heart failure: Secondary | ICD-10-CM

## 2022-12-21 NOTE — Telephone Encounter (Signed)
HF Paramedicine Team Based Care Meeting  One Month Post Enrollment Assessment  HF MD- NA  HF NP - Amy Clegg NP-C   [facility] HF Paramedicine  Katie Vicente Males  [facility] admit within the last 30 days for heart failure? no  Medications concerns? Good grasp of his medications though doesn't use pill box he can verbalize how to take his medications.  Transportation issues ? no  Education needs? no  SDOH concerns? no  [facility] Referrals Placed: place referral to help get motorized scooter.  Barriers to discharge? Does not appear to have barriers to DC at this time.  Newer referral so will keep another couple of visits and consider DC.    Burna Sis, LCSW Clinical Social Worker [facility] Desk#: [phone] Cell#: [phone]

## 2022-12-22 ENCOUNTER — Telehealth: Payer: Self-pay | Admitting: *Deleted

## 2022-12-22 NOTE — Progress Notes (Signed)
  Care Coordination  Outreach Note  12/22/2022 Name: [name] MRN: [id number] DOB: 03/27/1951   Care Coordination Outreach Attempts: An unsuccessful telephone outreach was attempted today to offer the patient information about available care coordination services as a benefit of their health plan.   Follow Up Plan:  Additional outreach attempts will be made to offer the patient care coordination information and services.   Encounter Outcome:  No Answer  Burman Nieves, CCMA Care Coordination Care Guide Direct Dial: [phone]

## 2022-12-26 ENCOUNTER — Ambulatory Visit: Payer: HMO | Admitting: Cardiology

## 2022-12-26 NOTE — Progress Notes (Unsigned)
  Care Coordination  Outreach Note  12/26/2022 Name: [name] MRN: [id number] DOB: 02-27-1951   Care Coordination Outreach Attempts: A second unsuccessful outreach was attempted today to offer the patient with information about available care coordination services as a benefit of their health plan.     Follow Up Plan:  Additional outreach attempts will be made to offer the patient care coordination information and services.   Encounter Outcome:  No Answer  Burman Nieves, CCMA Care Coordination Care Guide Direct Dial: [phone]

## 2022-12-27 ENCOUNTER — Ambulatory Visit: Payer: Self-pay | Admitting: Cardiology

## 2022-12-28 ENCOUNTER — Telehealth: Payer: Self-pay

## 2022-12-28 DIAGNOSIS — H35372 Puckering of macula, left eye: Secondary | ICD-10-CM | POA: Diagnosis not present

## 2022-12-28 DIAGNOSIS — H3582 Retinal ischemia: Secondary | ICD-10-CM | POA: Diagnosis not present

## 2022-12-28 DIAGNOSIS — H34813 Central retinal vein occlusion, bilateral, with macular edema: Secondary | ICD-10-CM | POA: Diagnosis not present

## 2022-12-28 DIAGNOSIS — E113393 Type 2 diabetes mellitus with moderate nonproliferative diabetic retinopathy without macular edema, bilateral: Secondary | ICD-10-CM | POA: Diagnosis not present

## 2022-12-28 NOTE — Progress Notes (Signed)
  Care Coordination   Note   12/28/2022 Name: [name] MRN: [id number] DOB: Jun 13, 1951  [name] is a 72 y.o. year old male who sees [name], MD for primary care. I reached out to [name] by phone today to offer care coordination services.  [name] was given information about Care Coordination services today including:   The Care Coordination services include support from the care team which includes your Nurse Coordinator, Clinical Social Worker, or Pharmacist.  The Care Coordination team is here to help remove barriers to the health concerns and goals most important to you. Care Coordination services are voluntary, and the patient may decline or stop services at any time by request to their care team member.   Care Coordination Consent Status: Patient agreed to services and verbal consent obtained.   Follow up plan:  Telephone appointment with care coordination team member scheduled for:  12/29/2022  Encounter Outcome:  Pt. Scheduled from referral   Burman Nieves, [facility] Care Coordination Care Guide Direct Dial: [phone]

## 2022-12-28 NOTE — Progress Notes (Signed)
Care Management & Coordination Services Pharmacy Team  Reason for Encounter: Appointment Reminder  Contacted patient to confirm telephone appointment with Al Corpus , PharmD on 01/02/23 at 2:00. Spoke with patient on 12/28/2022   Do you have any problems getting your medications?  If yes what types of problems are you experiencing? Financial barriers- New cardiology medication - patient states given after hospital stay and first month was free, now 58$.  What is your top health concern you would like to discuss at your upcoming visit? Medication cost   Have you seen any other providers since your last visit with PCP? Yes   Cardiology   AM fasting BG's running  90,130 sometimes 140.  Hospital visits:  None in previous 6 months   Star Rating Drugs:  Medication:  Last Fill: Day Supply Trulicity   manufacturer Jardiance 10mg  11/08/22 30 Novolog  04/04/2022 94 Patient states he is taking  Entresto 24-26mg  11/21/22 30 Tresiba   04/29/22  25 Patient states he is taking    Care Gaps: Annual wellness visit in last year? Yes  If Diabetic: Last eye exam / retinopathy screening:2022 Last diabetic foot exam:2023   Al Corpus, PharmD notified  Burt Knack, [facility] Clinical Pharmacy Assistant [phone]

## 2022-12-29 ENCOUNTER — Telehealth: Payer: Self-pay | Admitting: Family Medicine

## 2022-12-29 ENCOUNTER — Other Ambulatory Visit: Payer: Self-pay | Admitting: Family Medicine

## 2022-12-29 ENCOUNTER — Ambulatory Visit: Payer: Self-pay

## 2022-12-29 ENCOUNTER — Other Ambulatory Visit (HOSPITAL_COMMUNITY): Payer: Self-pay

## 2022-12-29 NOTE — Telephone Encounter (Signed)
Patient called in and stated that he was informed to reach out to [place] with help affording medications. He can be reached at [phone]. Thank you!

## 2022-12-29 NOTE — Telephone Encounter (Signed)
Patient called in and stated that he needs an order for an electric scooter to be sent in for him. He stated that he can't walk. Thank you!

## 2022-12-29 NOTE — Patient Outreach (Signed)
  Care Coordination   Initial Visit Note   12/29/2022 Name: [name] MRN: [id number] DOB: 1950/09/08  [name] is a 72 y.o. year old male who sees [name], MD for primary care. I spoke with  [name] by phone today.  What matters to the patients health and wellness today?  Patient lives independently and his family helps.  He has a rollator walker but feels he would also benefit from an electric scooter.  His vision is impaired and he is exhausted after performing small tasks.  He has a ramp on the home.     Goals Addressed             This Visit's Progress    Obtain a scooter       -Patient will speak to his primary doctor to request an order for a scooter -Patient has bed bugs and will work with his family during the month of May to resolve -Patient has been previously educated on house keeping after bed bug treatment         SDOH assessments and interventions completed:  Yes  SDOH Interventions Today    Flowsheet Row Most Recent Value  SDOH Interventions   Food Insecurity Interventions Intervention Not Indicated  Housing Interventions Intervention Not Indicated  Transportation Interventions Other (Comment)  [Driver's license not able to renew.  Family provides transportation.]  Utilities Interventions Intervention Not Indicated        Care Coordination Interventions:  Yes, provided   -CM agreed to follow up with coordinating DME supplier if needed, once the doctor has made a decision.  Follow up plan: Follow up call scheduled for 01/05/23 at 1pm.    Encounter Outcome:  Pt. Visit Completed

## 2022-12-29 NOTE — Patient Instructions (Signed)
Visit Information  Thank you for taking time to visit with me today. Please don't hesitate to contact me if I can be of assistance to you.   Following are the goals we discussed today:   Goals Addressed             This Visit's Progress    Obtain a scooter       -Patient will speak to his primary doctor to request an order for a scooter -Patient has bed bugs and will work with his family during the month of May to resolve -Patient has been previously educated on house keeping after bed bug treatment         Our next appointment is by telephone on 01/05/23 at 1pm  Please call the care guide team at [phone] if you need to cancel or reschedule your appointment.   If you are experiencing a Mental Health or Behavioral Health Crisis or need someone to talk to, please call 911  Patient verbalizes understanding of instructions and care plan provided today and agrees to view in MyChart. Active MyChart status and patient understanding of how to access instructions and care plan via MyChart confirmed with patient.     Telephone follow up appointment with care management team member scheduled for:01/05/23 at 1pm.  Lysle Morales, BSW Social Worker [facility] Care Management  [phone]

## 2022-12-29 NOTE — Progress Notes (Signed)
Paramedicine Encounter    Patient ID: [name], male    DOB: [birth year], 72 y.o.   MRN: [id number]   Complaints-none   Edema-some to rt leg, none to left   Compliance with meds-yes-no   Pill box filled-no-takes from bottles If so, by whom-n/a  Refills needed-delivering meds today   Pt reports he is doing ok. His weight is down a couple lbs from last visit. He had went out of town and ate a lot more than he usually does.   He denies increased sob, no dizziness, no c/p.  He has trouble staying asleep all night long, he will wake up sometimes in middle of night but then has hard time to go back to sleep.  He recently had shots in his eyes which is still bothering him some.  He denies waking up gasping for air or sob.  He reports if he feels like he has drank too much fluids then he will take extra lasix.  Meds verified and pill bottles checked and reviewed with pt to verify he is taking correctly.he has very slight edema to rt leg but that leg always has increased swelling to his rt leg b/c of his knee and he has a chronic redness to the lower leg part.  No edema noted to left leg.  Lungs clear.  Will f/u in a couple wks.   CBG PTA-80  BP 110/66   Pulse 72   Resp 16   Wt 250 lb (113.4 kg)   SpO2 98%   BMI 40.35 kg/m  Weight yesterday--249  Last visit weight-252  Patient Care Team: [name], MD as PCP - General Mariah Milling, Tollie Pizza, MD as Consulting Physician (Cardiology) Kathyrn Sheriff, [facility] as Pharmacist (Pharmacist)  Patient Active Problem List   Diagnosis Date Noted   NSTEMI (non-ST elevated myocardial infarction) (HCC) 11/05/2022   COVID-19 virus infection 11/05/2022   Penis disorder 05/16/2022   Other fatigue 05/16/2022   Rash 05/12/2022   GAD (generalized anxiety disorder) 09/21/2021   Statin myopathy 03/30/2021   Chronic midline low back pain 11/12/2020   ED (erectile dysfunction) 03/06/2018   Acute on chronic diastolic CHF (congestive heart  failure) (HCC) 09/20/2016   Adjustment disorder with mixed anxiety and depressed mood 09/09/2016   Gastroesophageal reflux disease 09/09/2016   Elevated left ventricular end-diastolic pressure (LVEDP) 03/11/2016   Anemia 03/11/2016   Hypertensive heart disease with heart failure (HCC)    Diabetes mellitus type 2 with retinopathy (HCC)    Coronary artery disease of native artery of native heart with stable angina pectoris (HCC)    Morbid obesity (HCC)    Chronic chest pain    Diabetic retinopathy (HCC) 09/23/2014   Snoring 12/21/2012   HYPOGONADISM 09/28/2010   B12 deficiency 09/28/2010   Vitamin D deficiency 09/28/2010   OTHER MALAISE AND FATIGUE 09/17/2010   TRIGGER FINGER, RIGHT MIDDLE 09/14/2010   BRANCH RETINAL VEIN OCCLUSION 11/26/2009   CONSTIPATION 08/10/2009   GASTRITIS 07/29/2009   Major depressive disorder, recurrent episode, moderate (HCC) 07/06/2007   RENAL CALCULUS, HX OF 03/26/2007   Hyperlipidemia associated with type 2 diabetes mellitus (HCC) 12/05/2006   Hypertension associated with diabetes (HCC) 12/05/2006   Osteoarthritis 12/05/2006    Current Outpatient Medications:    Acetaminophen (TYLENOL PO), Take 3-4 tablets by mouth as needed (pain)., Disp: , Rfl:    aspirin 81 MG EC tablet, Take 1 tablet (81 mg total) by mouth daily., Disp: , Rfl:    carvedilol (COREG)  6.25 MG tablet, Take 1 tablet (6.25 mg total) by mouth 2 (two) times daily with a meal., Disp: 60 tablet, Rfl: 5   cholecalciferol (VITAMIN D3) 25 MCG (1000 UNIT) tablet, Take 2,000 Units by mouth daily., Disp: , Rfl:    clopidogrel (PLAVIX) 75 MG tablet, TAKE 1 TABLET BY MOUTH ONCE DAILY WITH BREAKFAST (Patient taking differently: Take 75 mg by mouth daily.), Disp: 90 tablet, Rfl: 3   Continuous Blood Gluc Sensor (FREESTYLE LIBRE 2 SENSOR) MISC, APPLY SENSOR EVERY 14 DAYS TO MONITOR SUGAR CONTINOUSLY, Disp: 2 each, Rfl: 11   Dulaglutide (TRULICITY) 4.5 MG/0.5ML SOPN, Inject 4.5 mg into the skin once a  week. Via [name] PAP, Disp: , Rfl:    empagliflozin (JARDIANCE) 10 MG TABS tablet, Take 1 tablet (10 mg total) by mouth daily., Disp: 30 tablet, Rfl: 5   ezetimibe (ZETIA) 10 MG tablet, TAKE 1 TABLET BY MOUTH ONCE DAILY (Patient taking differently: Take 10 mg by mouth daily.), Disp: 30 tablet, Rfl: 2   furosemide (LASIX) 40 MG tablet, Take 1 tablet (40 mg total) by mouth daily., Disp: 30 tablet, Rfl: 2   hydrOXYzine (ATARAX) 10 MG tablet, TAKE 1 TABLET BY MOUTH 3 TIMES DAILY AS NEEDED FOR ANXIETY (Patient taking differently: Take 10 mg by mouth 3 (three) times daily as needed for anxiety.), Disp: 30 tablet, Rfl: 2   insulin aspart (NOVOLOG FLEXPEN) 100 UNIT/ML FlexPen, 13 units in AM (scheduled) and 3 units PRN in evening (Patient taking differently: Inject 3-13 Units into the skin in the morning and at bedtime. Inject 13 units in the morning and 3 units at night), Disp: 15 mL, Rfl: 6   isosorbide mononitrate (IMDUR) 30 MG 24 hr tablet, Take 1 tablet (30 mg total) by mouth 2 (two) times daily., Disp: 60 tablet, Rfl: 2   nitroGLYCERIN (NITROSTAT) 0.4 MG SL tablet, DISSOLVE 1 TABLET UNDER TONGUE AS NEEDEDFOR CHEST PAIN. MAY REPEAT 5 MINUTES APART 3 TIMES IF NEEDED, Disp: 25 tablet, Rfl: 3   sacubitril-valsartan (ENTRESTO) 24-26 MG, Take 1 tablet by mouth 2 (two) times daily., Disp: 60 tablet, Rfl: 3   spironolactone (ALDACTONE) 25 MG tablet, Take 1 tablet (25 mg total) by mouth daily., Disp: 30 tablet, Rfl: 5   TRESIBA FLEXTOUCH 100 UNIT/ML FlexTouch Pen, INJECT 50 UNITS INTO THE SKIN DAILY, Disp: 15 mL, Rfl: 2   TRUEPLUS 5-BEVEL PEN NEEDLES 31G X 6 MM MISC, USE TO INJECT INSULIN THREE TIMES A DAY, Disp: 300 each, Rfl: 3   venlafaxine XR (EFFEXOR-XR) 150 MG 24 hr capsule, TAKE 1 CAPSULE BY MOUTH DAILY WITH BREAKFAST. TAKE WITH EFFEXOR XR 75MG  FORA TOTAL OF 225MG  (Patient taking differently: Take 150 mg by mouth daily with breakfast. Take with 75 mg), Disp: 90 capsule, Rfl: 0   vitamin B-12  (CYANOCOBALAMIN) 1000 MCG tablet, Take 1,000 mcg by mouth daily., Disp: , Rfl:    guaiFENesin-dextromethorphan (ROBITUSSIN DM) 100-10 MG/5ML syrup, Take 5 mLs by mouth every 4 (four) hours as needed for cough. (Patient not taking: Reported on 12/19/2022), Disp: 118 mL, Rfl: 0   HYDROcodone-acetaminophen (NORCO) 5-325 MG tablet, Take 1-2 tablets by mouth daily as needed for moderate pain., Disp: 60 tablet, Rfl: 0   [START ON 01/17/2023] HYDROcodone-acetaminophen (NORCO) 5-325 MG tablet, Take 1-2 tablets by mouth daily as needed for moderate pain. May, Disp: 60 tablet, Rfl: 0   HYDROcodone-acetaminophen (NORCO) 5-325 MG tablet, Take 1-2 tablets by mouth daily as needed for moderate pain. April, Disp: 60 tablet, Rfl: 0  triamcinolone cream (KENALOG) 0.5 %, APPLY ONE APPLICATION TOPICALLY TWO (TWO) TIMES DAILY., Disp: 30 g, Rfl: 0 Allergies  Allergen Reactions   Bupropion Nausea Only   Ozempic (0.25 Or 0.5 Mg-Dose) [Semaglutide(0.25 Or 0.5mg -Dos)] Nausea And Vomiting   Atorvastatin Other (See Comments)    Body aches Similar effect with rosuvastatin 40 mg twice weekly      Social History   Socioeconomic History   Marital status: Widowed    Spouse name: Not on file   Number of children: 3   Years of education: Not on file   Highest education level: 7th grade  Occupational History   Occupation: disabled    Associate Professor: UNEMPLOYED    Comment: back injury  Tobacco Use   Smoking status: Never   Smokeless tobacco: Never  Vaping Use   Vaping Use: Never used  Substance and Sexual Activity   Alcohol use: No    Alcohol/week: 0.0 standard drinks of alcohol   Drug use: No   Sexual activity: Not Currently  Other Topics Concern   Not on file  Social History Narrative   Financial concerns, wife with depression.   Minimal exercise.   Diet: poor.   Social Determinants of Health   Financial Resource Strain: Low Risk  (11/23/2022)   Overall Financial Resource Strain (CARDIA)    Difficulty of  Paying Living Expenses: Not hard at all  Food Insecurity: No Food Insecurity (12/29/2022)   Hunger Vital Sign    Worried About Running Out of Food in the Last Year: Never true    Ran Out of Food in the Last Year: Never true  Transportation Needs: No Transportation Needs (12/29/2022)   PRAPARE - Administrator, Civil Service (Medical): No    Lack of Transportation (Non-Medical): No  Physical Activity: Inactive (11/09/2022)   Exercise Vital Sign    Days of Exercise per Week: 0 days    Minutes of Exercise per Session: 0 min  Stress: No Stress Concern Present (11/09/2022)   [employer] of Occupational Health - Occupational Stress Questionnaire    Feeling of Stress : Only a little  Social Connections: Moderately Isolated (11/09/2022)   Social Connection and Isolation Panel [NHANES]    Frequency of Communication with Friends and Family: More than three times a week    Frequency of Social Gatherings with Friends and Family: More than three times a week    Attends Religious Services: More than 4 times per year    Active Member of [employer] or Organizations: No    Attends Banker Meetings: Never    Marital Status: Widowed  Intimate Partner Violence: Not At Risk (11/09/2022)   Humiliation, Afraid, Rape, and Kick questionnaire    Fear of Current or Ex-Partner: No    Emotionally Abused: No    Physically Abused: No    Sexually Abused: No    Physical Exam      Future Appointments  Date Time Provider Department Center  01/02/2023  2:00 PM Kathyrn Sheriff, [facility] None  01/30/2023  2:30 PM Delma Freeze, FNP [facility] None  01/30/2023  3:35 PM Charlsie Quest, NP [facility] None  02/17/2023  3:00 PM [name], MD [facility]  11/14/2023 10:45 AM [facility] ANNUAL WELLNESS VISIT 1 [facility]       Kerry Hough, Paramedic [phone] [facility] Paramedic  12/29/22

## 2022-12-30 ENCOUNTER — Other Ambulatory Visit (HOSPITAL_COMMUNITY): Payer: Self-pay

## 2022-12-30 ENCOUNTER — Telehealth (HOSPITAL_COMMUNITY): Payer: Self-pay

## 2022-12-30 NOTE — Telephone Encounter (Signed)
Advanced Heart Failure Patient Advocate Encounter  The patient was approved for a [employer] grant that will help cover the cost of Carvedilol, Jardiance, Entresto, Spironolactone.  Total amount awarded, $10,000.  Effective: 11/30/2022 - 11/29/2023.  BIN [id number] PCN [id number] [id number] ID [id number]  Processing information provided to [facility], they are not able to reprocess the claim at this time but will contact me with issues or contact patient for pickup. Informed patient by phone.  Burnell Blanks, CPhT Rx Patient Advocate Phone: [phone]

## 2022-12-30 NOTE — Telephone Encounter (Cosign Needed)
Yes, we talked about you and he discussing one of his cardiology medications that is now expensive. He will discuss with you on Monday ,   Burt Knack, [facility] Clinical Pharmacy Assistant [phone]

## 2022-12-30 NOTE — Telephone Encounter (Signed)
Referral has been sent to the [facility] and pt has been sent a Medical laboratory scientific officer

## 2022-12-30 NOTE — Telephone Encounter (Signed)
Please contact patient. Of note I have a phone appt with him on Monday 5/6

## 2022-12-30 NOTE — Telephone Encounter (Signed)
We have to see him office for a mobility assessment to document everything correctly before we can send an order for an electric scooter.   Please schedule an appt some time next week (or after)

## 2023-01-02 ENCOUNTER — Ambulatory Visit: Payer: PPO | Admitting: Pharmacist

## 2023-01-02 ENCOUNTER — Other Ambulatory Visit: Payer: Self-pay | Admitting: Family Medicine

## 2023-01-02 NOTE — Progress Notes (Signed)
Care Management & Coordination Services Pharmacy Note  01/02/2023 Name:  [name] MRN:  [id number] DOB:  02/08/1951  Summary: F/U visit -DM:  A1c 7.2% (10/2022), pt is back on Trulicity 4.5 mg now after side effects with Ozempic; he has started Jardiance (per cardiology) since last A1c; he reports glucose range 100-200s per CGM -HFmrEF: ECHO with worsening HF in hospital (40-45%), pt has been placed on Jardiance, spironolactone and Entresto. He has [employer] grant to help with copays -CAD/HLD: LDL was 56 in 10/2020, but pt has been off statin since summer of 2022; cholesterol may have improved with wt loss, improved DM control -Poor sleep: pt reports long history of this, he has been referred for sleep apnea testing  Recommendations/Changes made from today's visit: -No med changes -Recommend lipid panel at next OV -Scheduled PCP appt 01/10/23 to assess mobility for scooter  Follow up plan: -Pharmacist follow up televisit scheduled for 2 months - PCP appt 02/17/23    Subjective: [name] is an 72 y.o. year old male who is a primary patient of Bedsole, Amy E, MD.  The care coordination team was consulted for assistance with disease management and care coordination needs.    Engaged with patient by telephone for follow up visit.  Patient Care Team: [name], MD as PCP - General Mariah Milling Tollie Pizza, MD as Consulting Physician (Cardiology) Kathyrn Sheriff, [facility] as Pharmacist (Pharmacist)  Recent office visits: 11/17/22 Dr Tresa Moore OV: hospital f/u - A1c 7.2%, remain off Ozempic, continue new Jardiance. Renewed Norco rx.  08/18/22 Dr Ermalene Searing OV: f/u DM - A1c 7.1%. Consider changing Trulicity to Ozempic if no wt loss at f/u.  05/12/22 Dr Ermalene Searing OV: A1c 6.6%, having lows. Reduce Tresiba to 57 units. Referral for sleep apnea and urology. Triamcinolone cream for rash.   03/29/22 NP Tabitha Dugal OV: Strep and covid positive - rx Augmentin.   02/08/22 Dr Ermalene Searing OV: f/u DM - A1c  7.5%; consider holding PM dose of Novolog. Try 2 Tylenol middayu for breakthrough pain.  Recent consult visits: 12/19/22 NP [facility] (Cardiology): f/u HF - BMP today. Continue Entresto, consider titrating at f/u.  11/21/22 NP [facility] (Cardiology): f/u HF - d/c losartan. Start Entresto 24-26.  06/15/22 PA Carman Ching (Urology): ED - PDE-5 inhibitors are not indicated. Continue wt loss. No evidence of hypogonadism. Return PRN.   05/18/22 Dr Apolinar Junes (Urology): ED - discussed wt loss. ED not necessarily related to lower testosterone.   Hospital visits: 11/05/22 - 11/08/22 Admission [facility]): NSTEMI, Covid, sepsis. Tx with remdesivir, steroids for covid. Acute HF - add Jardiance 10 mg and spironolactone 25 mg. Consider Entresto outpatient. Switch Ozempic back to [place] (Trulicity ?)   Objective:  Lab Results  Component Value Date   CREATININE 1.00 12/19/2022   BUN 27 (H) 12/19/2022   GFR 71.68 11/02/2020   GFRNONAA >60 12/19/2022   GFRAA >60 04/02/2020   NA 138 12/19/2022   K 4.3 12/19/2022   CALCIUM 8.8 (L) 12/19/2022   CO2 27 12/19/2022   GLUCOSE 149 (H) 12/19/2022    Lab Results  Component Value Date/Time   HGBA1C 7.2 (A) 11/17/2022 02:47 PM   HGBA1C 7.1 (A) 08/18/2022 04:06 PM   HGBA1C 7.7 (H) 11/02/2020 08:42 AM   HGBA1C 8.6 (H) 07/31/2020 08:41 AM   HGBA1C 9.6 10/02/2018 12:00 AM   GFR 71.68 11/02/2020 08:42 AM   GFR 76.14 07/31/2020 08:41 AM   MICROALBUR <0.7 02/21/2017 01:57 PM   MICROALBUR 0.8 02/16/2016 08:24 AM  Last diabetic Eye exam:  Lab Results  Component Value Date/Time   HMDIABEYEEXA Retinopathy (A) 01/26/2021 12:00 AM    Last diabetic Foot exam:  Lab Results  Component Value Date/Time   HMDIABFOOTEX done 11/01/2021 12:00 AM     Lab Results  Component Value Date   CHOL 107 11/02/2020   HDL 34.30 (L) 11/02/2020   LDLCALC 56 11/02/2020   LDLDIRECT 174.0 10/19/2017   TRIG 82.0 11/02/2020   CHOLHDL 3 11/02/2020       Latest Ref Rng & Units  11/05/2022    8:24 AM 11/02/2020    8:42 AM 07/31/2020    8:41 AM  Hepatic Function  Total Protein 6.5 - 8.1 g/dL 7.2  7.4  6.7   Albumin 3.5 - 5.0 g/dL 3.5  3.9  3.9   AST 15 - 41 U/L 31  18  12    ALT 0 - 44 U/L 16  14  12    Alk Phosphatase 38 - 126 U/L 73  77  85   Total Bilirubin 0.3 - 1.2 mg/dL 0.8  0.4  0.4     Lab Results  Component Value Date/Time   TSH 2.218 11/05/2022 08:25 AM   TSH 2.64 10/19/2017 04:00 PM   TSH 1.14 03/11/2014 10:26 AM   FREET4 0.79 10/19/2017 04:00 PM       Latest Ref Rng & Units 11/08/2022    7:08 AM 11/07/2022   12:59 AM 11/06/2022    1:12 AM  CBC  WBC 4.0 - 10.5 K/uL 6.2  6.0  3.9   Hemoglobin 13.0 - 17.0 g/dL 16.1  09.6  04.5   Hematocrit 39.0 - 52.0 % 35.3  33.8  32.6   Platelets 150 - 400 K/uL 231  245  198    Iron/TIBC/Ferritin/ %Sat    Component Value Date/Time   FERRITIN 109 11/07/2022 0059    Lab Results  Component Value Date/Time   VD25OH 47.61 02/01/2022 07:52 AM   VD25OH 37.72 07/31/2020 08:41 AM   VITAMINB12 355 02/01/2022 07:52 AM   VITAMINB12 111 (L) 11/02/2020 08:42 AM    Clinical ASCVD: Yes  The ASCVD Risk score (Arnett DK, et al., 2019) failed to calculate for the following reasons:   The patient has a prior MI or stroke diagnosis       11/09/2022   10:58 AM 10/24/2022    3:22 PM 08/18/2022    3:52 PM  Depression screen PHQ 2/9  Decreased Interest 2 1 3   Down, Depressed, Hopeless 2 1 2   PHQ - 2 Score 4 2 5   Altered sleeping 3 3 3   Tired, decreased energy 3 3 3   Change in appetite 0 0 0  Feeling bad or failure about yourself  2 1 1   Trouble concentrating 0 1 1  Moving slowly or fidgety/restless 0 0 0  Suicidal thoughts 0 1 0  PHQ-9 Score 12 11 13   Difficult doing work/chores Not difficult at all  Somewhat difficult       11/16/2021    9:37 AM 11/05/2021    2:37 PM  GAD 7 : Generalized Anxiety Score  Nervous, Anxious, on Edge 2 3  Control/stop worrying 2 3  Worry too much - different things 3 0  Trouble  relaxing 3 3  Restless 1 3  Easily annoyed or irritable 2 0  Afraid - awful might happen 1 0  Total GAD 7 Score 14 12  Anxiety Difficulty Extremely difficult     Social History  Tobacco Use  Smoking Status Never  Smokeless Tobacco Never   BP Readings from Last 3 Encounters:  12/29/22 110/66  12/19/22 137/84  12/14/22 130/70   Pulse Readings from Last 3 Encounters:  12/29/22 72  12/19/22 75  12/14/22 77   Wt Readings from Last 3 Encounters:  12/29/22 250 lb (113.4 kg)  12/19/22 255 lb (115.7 kg)  12/14/22 252 lb (114.3 kg)   BMI Readings from Last 3 Encounters:  12/29/22 40.35 kg/m  12/19/22 41.16 kg/m  12/14/22 40.67 kg/m    Allergies  Allergen Reactions   Bupropion Nausea Only   Ozempic (0.25 Or 0.5 Mg-Dose) [Semaglutide(0.25 Or 0.5mg -Dos)] Nausea And Vomiting   Atorvastatin Other (See Comments)    Body aches Similar effect with rosuvastatin 40 mg twice weekly    Medications Reviewed Today     Reviewed by Kathyrn Sheriff, [facility] (Pharmacist) on 01/02/23 at 1425  Med List Status: <None>   Medication Order Taking? Sig Documenting Provider Last Dose Status Informant  Acetaminophen (TYLENOL PO) [id number] Yes Take 3-4 tablets by mouth as needed (pain). [provider] Taking Active Child, Pharmacy Records           Med Note Marco Collie Jan 02, 2023  2:23 PM)    aspirin 81 MG EC tablet [id number] Yes Take 1 tablet (81 mg total) by mouth daily. Creig Hines, NP Taking Active Child, Pharmacy Records           Med Note Christain Sacramento, Monna Fam May 01, 2016  3:28 PM)    carvedilol (COREG) 6.25 MG tablet [id number] Yes Take 1 tablet (6.25 mg total) by mouth 2 (two) times daily with a meal. Delma Freeze, FNP Taking Active   cholecalciferol (VITAMIN D3) 25 MCG (1000 UNIT) tablet [id number] Yes Take 2,000 Units by mouth daily. [provider] Taking Active Child, Pharmacy Records  clopidogrel (PLAVIX) 75 MG tablet  [id number] Yes TAKE 1 TABLET BY MOUTH ONCE DAILY WITH BREAKFAST [name], MD Taking Active Child, Pharmacy Records           Med Note [facility], KATIE   Thu Dec 01, 2022 10:32 AM)    Continuous Blood Gluc Sensor (FREESTYLE LIBRE 2 SENSOR) MISC [id number] Yes APPLY SENSOR EVERY 14 DAYS TO MONITOR SUGAR CONTINOUSLY Bedsole, Amy E, MD Taking Active Child, Pharmacy Records  Dulaglutide (TRULICITY) 4.5 MG/0.5ML SOPN [id number] Yes Inject 4.5 mg into the skin once a week. Via [name] [place], Amy E, MD Taking Active   empagliflozin (JARDIANCE) 10 MG TABS tablet [id number] Yes Take 1 tablet (10 mg total) by mouth daily. Delma Freeze, FNP Taking Active   ezetimibe (ZETIA) 10 MG tablet [id number] Yes TAKE 1 TABLET BY MOUTH ONCE DAILY Gollan, Tollie Pizza, MD Taking Active Child, Pharmacy Records           Med Note [facility], KATIE   Thu Dec 01, 2022 10:33 AM)    furosemide (LASIX) 40 MG tablet [id number] Yes Take 1 tablet (40 mg total) by mouth daily. Lorin Glass, MD Taking Active   guaiFENesin-dextromethorphan [facility] DM) 100-10 MG/5ML syrup [id number] Yes Take 5 mLs by mouth every 4 (four) hours as needed for cough. Lorin Glass, MD Taking Active   HYDROcodone-acetaminophen (NORCO) 5-325 MG tablet [id number] Yes Take 1-2 tablets by mouth daily as needed for moderate pain. [name], MD Taking Active   HYDROcodone-acetaminophen (NORCO) 5-325 MG tablet [id number] Yes Take 1-2 tablets by mouth daily  as needed for moderate pain. Candace Gallus, MD Taking Active   HYDROcodone-acetaminophen [facility]) 5-325 MG tablet [id number] Yes Take 1-2 tablets by mouth daily as needed for moderate pain. April [name], MD Taking Active   hydrOXYzine (ATARAX) 10 MG tablet [id number] Yes TAKE 1 TABLET BY MOUTH 3 TIMES DAILY AS NEEDED FOR ANXIETY Ermalene Searing, Amy E, MD Taking Active Child, Pharmacy Records           Med Note [facility], KATIE   Thu Dec 01, 2022 10:34 AM)    insulin aspart (NOVOLOG FLEXPEN) 100 UNIT/ML  FlexPen [id number] Yes 13 units in AM (scheduled) and 3 units PRN in evening  Patient taking differently: Inject 3-13 Units into the skin in the morning and at bedtime. Inject 13 units in the morning and 3 units at night   [name], MD Taking Active Child, Pharmacy Records           Med Note [facility], KATIE   Thu Dec 01, 2022 10:34 AM)    isosorbide mononitrate (IMDUR) 30 MG 24 hr tablet [id number] Yes Take 1 tablet (30 mg total) by mouth 2 (two) times daily. Lorin Glass, MD Taking Active   nitroGLYCERIN (NITROSTAT) 0.4 MG SL tablet [id number] Yes DISSOLVE 1 TABLET UNDER TONGUE AS NEEDEDFOR CHEST PAIN. MAY REPEAT 5 MINUTES APART 3 TIMES IF NEEDED Gollan, Tollie Pizza, MD Taking Active Child, Pharmacy Records           Med Note [facility], KATIE   Thu Dec 01, 2022 10:35 AM)    sacubitril-valsartan (ENTRESTO) 24-26 [place] [id number] Yes Take 1 tablet by mouth 2 (two) times daily. Delma Freeze, FNP Taking Active   spironolactone (ALDACTONE) 25 MG tablet [id number] Yes Take 1 tablet (25 mg total) by mouth daily. Delma Freeze, FNP Taking Active   TRESIBA FLEXTOUCH 100 UNIT/ML FlexTouch Pen [id number] Yes INJECT 50 UNITS INTO THE SKIN DAILY [name], MD Taking Active Child, Pharmacy Records           Med Note [facility], KATIE   Thu Dec 01, 2022 10:37 AM)    triamcinolone cream (KENALOG) 0.5 % [id number] Yes APPLY ONE APPLICATION TOPICALLY TWO (TWO) TIMES DAILY. [name], MD Taking Active   TRUEPLUS 5-BEVEL PEN NEEDLES 31G X 6 MM MISC [id number] Yes USE TO INJECT INSULIN THREE TIMES A DAY Bedsole, Amy E, MD Taking Active   venlafaxine XR (EFFEXOR-XR) 150 MG 24 hr capsule [id number] Yes TAKE 1 CAPSULE BY MOUTH DAILY WITH BREAKFAST. TAKE WITH EFFEXOR XR 75MG  FORA TOTAL OF 225MG   Patient taking differently: Take 150 mg by mouth daily with breakfast. Take with 75 mg   [name], MD Taking Active Child, Pharmacy Records           Med Note [facility], KATIE   Thu Dec 01, 2022 10:37 AM)    vitamin B-12  (CYANOCOBALAMIN) 1000 MCG tablet [id number] Yes Take 1,000 mcg by mouth daily. [provider] Taking Active Child, Pharmacy Records            Patient Active Problem List   Diagnosis Date Noted   NSTEMI (non-ST elevated myocardial infarction) (HCC) 11/05/2022   COVID-19 virus infection 11/05/2022   Penis disorder 05/16/2022   Other fatigue 05/16/2022   Rash 05/12/2022   GAD (generalized anxiety disorder) 09/21/2021   Statin myopathy 03/30/2021   Chronic midline low back pain 11/12/2020   ED (erectile dysfunction) 03/06/2018   Acute on chronic diastolic CHF (congestive heart failure) (  HCC) 09/20/2016   Adjustment disorder with mixed anxiety and depressed mood 09/09/2016   Gastroesophageal reflux disease 09/09/2016   Elevated left ventricular end-diastolic pressure (LVEDP) 03/11/2016   Anemia 03/11/2016   Hypertensive heart disease with heart failure (HCC)    Diabetes mellitus type 2 with retinopathy (HCC)    Coronary artery disease of native artery of native heart with stable angina pectoris (HCC)    Morbid obesity (HCC)    Chronic chest pain    Diabetic retinopathy (HCC) 09/23/2014   Snoring 12/21/2012   HYPOGONADISM 09/28/2010   B12 deficiency 09/28/2010   Vitamin D deficiency 09/28/2010   OTHER MALAISE AND FATIGUE 09/17/2010   TRIGGER FINGER, RIGHT MIDDLE 09/14/2010   BRANCH RETINAL VEIN OCCLUSION 11/26/2009   CONSTIPATION 08/10/2009   GASTRITIS 07/29/2009   Major depressive disorder, recurrent episode, moderate (HCC) 07/06/2007   RENAL CALCULUS, HX OF 03/26/2007   Hyperlipidemia associated with type 2 diabetes mellitus (HCC) 12/05/2006   Hypertension associated with diabetes (HCC) 12/05/2006   Osteoarthritis 12/05/2006    Immunization History  Administered Date(s) Administered   Fluad Quad(high Dose 65+) 05/23/2020, 05/12/2022   Influenza Split 05/12/2011, 07/09/2012   Influenza Whole 06/30/2007, 05/29/2008, 07/03/2009   Influenza, High Dose Seasonal PF  05/14/2019   Influenza,inj,Quad PF,6+ Mos 05/31/2013, 07/09/2014, 06/25/2015, 07/05/2016, 06/22/2017, 06/11/2018   PFIZER(Purple Top)SARS-COV-2 Vaccination 10/28/2019, 11/18/2019, 05/29/2020   Pneumococcal Conjugate-13 02/14/2015   Pneumococcal Polysaccharide-23 06/30/2007, 12/21/2012, 02/27/2018   Td 06/30/2007    SDOH:  (Social Determinants of Health) assessments and interventions performed: No SDOH Interventions    Flowsheet Row Care Coordination from 12/29/2022 in [facility] Care Coordination Telephone from 11/23/2022 in [facility] Telephone from 11/11/2022 in [facility] Care Coordination Clinical Support from 11/09/2022 in [facility] ED to Hosp-Admission (Discharged) from 11/05/2022 in [facility] CV SURGICAL PROGRESSIVE CARE Care Coordination from 10/24/2022 in [facility] Care Coordination  SDOH Interventions        Food Insecurity Interventions Intervention Not Indicated Intervention Not Indicated Intervention Not Indicated Intervention Not Indicated Intervention Not Indicated Intervention Not Indicated  Housing Interventions Intervention Not Indicated Intervention Not Indicated -- Intervention Not Indicated Intervention Not Indicated Intervention Not Indicated  Transportation Interventions Other (Comment)  [Driver's license not able to renew.  Family provides transportation.] Intervention Not Indicated Intervention Not Indicated Intervention Not Indicated Intervention Not Indicated --  Utilities Interventions Intervention Not Indicated Intervention Not Indicated -- Intervention Not Indicated -- --  Alcohol Usage Interventions -- -- -- Intervention Not Indicated (Score <7) Intervention Not Indicated (Score <7) --  Depression Interventions/Treatment  -- -- -- Currently on Treatment  [pt declines referral] -- --  Financial Strain Interventions --  Intervention Not Indicated -- Intervention Not Indicated Intervention Not Indicated --  Physical Activity Interventions -- -- -- Patient Refused, Other (Comments) -- --  Stress Interventions -- -- -- Patient Refused -- --  Social Connections Interventions -- -- -- Intervention Not Indicated -- --      SDOH Screenings   Food Insecurity: No Food Insecurity (12/29/2022)  Housing: Medium Risk (12/29/2022)  Transportation Needs: No Transportation Needs (12/29/2022)  Utilities: Not At Risk (12/29/2022)  Alcohol Screen: Low Risk  (11/09/2022)  Depression (PHQ2-9): High Risk (11/09/2022)  Financial Resource Strain: Low Risk  (11/23/2022)  Physical Activity: Inactive (11/09/2022)  Social Connections: Moderately Isolated (11/09/2022)  Stress: No Stress Concern Present (11/09/2022)  Tobacco Use: Low Risk  (12/19/2022)  Medication Assistance:  Trulicity [employer] Cares app pending 2024 [employer] [address] - Cardiomyopathy Sherryll Burger, [place]): Effective 11/30/22 - 11/29/23 BIN [id number] PCN [id number] [id number] ID [id number]   Medication Access: Within the past 30 days, how often has patient missed a dose of medication? 0 Is a pillbox or other method used to improve adherence? Yes  Factors that may affect medication adherence? no barriers identified Are meds synced by current pharmacy? No  Are meds delivered by current pharmacy? No  Does patient experience delays in picking up medications due to transportation concerns? No   [facility] Services Reviewed: Is patient disadvantaged to use [facility]?: Yes  Current Rx insurance plan: HTA Name and location of Current pharmacy:  [facility] - [place], [place] - [address] [place] [address] Phone: [phone] Fax: [phone]  [facility] services reviewed with patient today?: No  Patient requests to transfer care to [facility]?: No  Reason patient declined to change pharmacies: Disadvantaged due to  insurance/mail order  Compliance/Adherence/Medication fill history: Care Gaps: Eye exam (01/26/22) UACR (02/21/18) Foot exam (due 10/2022) Colonoscopy (due 08/2019)  Star-Rating Drugs: None    ASSESSMENT / PLAN  Diabetes (A1c goal <7%) -Query Controlled- A1c 7.2% (10/2022); [place] Pepper has been added since last A1c -Weight loss: 10/2021 283#, now 245# -UACR: 1.5 (01/2017), due for re-eval -Current home glucose readings - using Libre (reader)  BG range: 100-200s  Low glucose: 50s 1-2x in last few months; his hand goes numb with lows -Current medications: Jardiance 10 mg daily - Appropriate, Effective, Safe, Accessible Trulicity 4.5 mg - Appropriate, Effective, Safe, Accessible Tresiba - 52 units AM -Appropriate, Effective, Safe, Accessible Novolog 12u AM, 5 uPM -Appropriate, Effective, Safe, Accessible Freestyle Libre 2 (reader)- Appropriate, Effective, Safe, Accessible -Medications previously tried:  metformin, Ozempic (n/v) -Reviewed role of Jardiance in diabetes. A1c may improve with this addition -Recommend to continue current medication  Hyperlipidemia / CAD (LDL goal < 70) -Query controlled - LDL 56 (10/2020); He stopped rosuvastatin July 2022. This is his second failed statin. He continues Zetia as prescribed. His cholesterol may have improved with weight loss and improved diabetes control this year. -Hx CAD (hx NSTEMI/PCI 2017) -Current treatment: Ezetimibe 10 mg daily -Appropriate, Query Effective Clopidogrel 75 mg daily -Appropriate, Effective, Safe, Accessible Aspirin 81 mg daily -Appropriate, Effective, Safe, Accessible Nitroglycerin 0.4 mg SL prn -Appropriate, Effective, Safe, Accessible -Medications previously tried: rosuvastatin 40 mg 2x weekly, atorvastatin (dose unknown, on allergy list since at least 2009) -Educated on Cholesterol goals;  -Recommended to continue current medication; recommend repeat lipid panel at next OV; consider pravastatin trial  Hypertension /  Heart Failure (BP goal <140/90) -Controlled - BP at goal in office; pt recently started on Jardiance and Entesto, he reports both of these covered at no cost (he has DM/Heart plan) -Last ejection fraction: 40-45% (Date: 11/06/22) -HF type: HFmrEF (mildly reduced EF 41-49%) -Current home BP readings: n/a - pt does not have meter -Daily weight: 243-245 lbs -Current treatment: Carvedilol 6.25 mg daily -Appropriate, Effective, Safe, Accessible Furosemide 40 mg daily -Appropriate, Effective, Safe, Accessible Isosorbide [place] 30 mg BID -Appropriate, Effective, Safe, Accessible Spironolactone 25 mg daily - Appropriate, Effective, Safe, Accessible Entresto 24-26 mg BID (HW)- Appropriate, Effective, Safe, Accessible Jardiance 10 mg daily (HW)- Appropriate, Effective, Safe, Accessible -Medications previously tried: n/a  -Denies hypotensive/hypertensive symptoms -Educated on BP goals and benefits of medications for prevention of heart attack, stroke and kidney damage;Daily salt intake goal < 2300 mg; -Counseled to monitor  BP at home periodically -Recommended to continue current medication  Depression/Anxiety (Goal: manage symptoms) -Uncontrolled - per pt report -goes to bed at 4am, wakes up 2 pm -PHQ9: 17 (10/2021) - moderately severe depression -GAD7: 14 (10/2021) - moderate anxiety -Connected with PCP for mental health support -Current treatment: Venlafaxine XR 150 mg daily -Appropriate, Effective, Safe, Accessible Hydroxyzine 10 mg TID PRN - not taking -Medications previously tried/failed: bupropion (presumed SE) -Educated on Benefits of medication for symptom control -Recommended to continue current medication    Al Corpus, PharmD, BCACP Clinical Pharmacist [facility] [phone]

## 2023-01-02 NOTE — Telephone Encounter (Signed)
Spoke with patient for pharmacy call, scheduled appt with Dr Ermalene Searing while I had him. Appt 01/10/23 @ 2:40

## 2023-01-02 NOTE — Telephone Encounter (Signed)
Refill request Plavix Last refill 01/03/22 #90/3 Last office visit 11/17/22

## 2023-01-04 ENCOUNTER — Other Ambulatory Visit: Payer: Self-pay | Admitting: Family Medicine

## 2023-01-04 NOTE — Telephone Encounter (Signed)
Last refilled 11/09/2022 for #30 with 2 refills,  by Dr. Melina Schools Dahal  with instructions of take 1 tablet by mouth daily.  Refill request states take one tablet daily and can take a second daily dose as needed.  Pharmacy requesting #[phone].  Please advise how furosemide should be prescribed.

## 2023-01-10 ENCOUNTER — Ambulatory Visit (INDEPENDENT_AMBULATORY_CARE_PROVIDER_SITE_OTHER): Payer: PPO | Admitting: Family Medicine

## 2023-01-10 ENCOUNTER — Encounter: Payer: Self-pay | Admitting: Family Medicine

## 2023-01-10 VITALS — BP 136/72 | HR 74 | Temp 97.7°F | Ht 66.0 in | Wt 254.0 lb

## 2023-01-10 DIAGNOSIS — B354 Tinea corporis: Secondary | ICD-10-CM | POA: Diagnosis not present

## 2023-01-10 DIAGNOSIS — Z7689 Persons encountering health services in other specified circumstances: Secondary | ICD-10-CM | POA: Diagnosis not present

## 2023-01-10 DIAGNOSIS — E11622 Type 2 diabetes mellitus with other skin ulcer: Secondary | ICD-10-CM

## 2023-01-10 DIAGNOSIS — Z7985 Long-term (current) use of injectable non-insulin antidiabetic drugs: Secondary | ICD-10-CM

## 2023-01-10 DIAGNOSIS — Z794 Long term (current) use of insulin: Secondary | ICD-10-CM | POA: Diagnosis not present

## 2023-01-10 DIAGNOSIS — G6289 Other specified polyneuropathies: Secondary | ICD-10-CM

## 2023-01-10 DIAGNOSIS — R269 Unspecified abnormalities of gait and mobility: Secondary | ICD-10-CM

## 2023-01-10 DIAGNOSIS — R52 Pain, unspecified: Secondary | ICD-10-CM

## 2023-01-10 DIAGNOSIS — L97309 Non-pressure chronic ulcer of unspecified ankle with unspecified severity: Secondary | ICD-10-CM

## 2023-01-10 DIAGNOSIS — Z789 Other specified health status: Secondary | ICD-10-CM | POA: Diagnosis not present

## 2023-01-10 MED ORDER — KETOCONAZOLE 2 % EX CREA: 1.0000 | TOPICAL_CREAM | Freq: Every day | CUTANEOUS | 0 refills | Status: DC

## 2023-01-10 MED ORDER — TERBINAFINE HCL 250 MG PO TABS: 250.0000 mg | ORAL_TABLET | Freq: Every day | ORAL | 0 refills | Status: DC

## 2023-01-10 NOTE — Progress Notes (Signed)
Patient ID: [name], male    DOB: 09-28-1950, 72 y.o.   MRN: [id number]  This visit was conducted in person.  BP 136/72 (BP Location: Right Arm)   Pulse 74   Temp 97.7 F (36.5 C) (Temporal)   Ht 5\' 6"  (1.676 m)   Wt 254 lb (115.2 kg)   SpO2 98%   BMI 41.00 kg/m    CC:  Chief Complaint  Patient presents with  . Mobility Assessment    For Scooter    Subjective:   HPI: [name] is a 72 y.o. male presenting on 01/10/2023 for Mobility Assessment (For Scooter)   Decreased mobility due to chronic low back pain, chronic leg weakness, morbid obesity, peripheral neuropathy and balance issues resulting in need for hover-round/power mobility device to use  in house.   PMD is necessary to perform ADLs at home including getting to bathroom to toilet, getting to kitchen to eat and cooking, getting to bedroom to dress etc.  He is out of balance using a cane.  Legs given put on him after 8 feet of walking. Can only stand 30 seconds with leaning on anything. He has fallen multiple times given leg weakness and balance issues.   He cannot use a cane/walker given his poor balance and a leg weakness.  His finger numbness  and cramping that makes it difficult propel a manual wheelchair. Also chronic low back pain limits his ablity to maneuver a manual wheel chair. A scooter would be able to fit in his house  He can operate a scooter safely, both mentally and physically.  He is willing and motivated to use the device.Marland Kitchen He would uses it daily during all waking hours.         Continuing to loss weight overt time with GLP1... eating healthy, decrease appetite. 35 lbs  in last 6 months. Wt Readings from Last 3 Encounters:  01/10/23 254 lb (115.2 kg)  12/29/22 250 lb (113.4 kg)  12/19/22 255 lb (115.7 kg)   Peripheral neuropathy He has noted 3 ulcers on right foot.Marland Kitchen area on right heel 3 weeks ago  Anterior foot ulcer.. on present for years, other one present for 6 months.  No  discharge.  Ankle ulcer is painful.    Chronic rash on right lower leg... seen derm in past. Treated with antifungal improved but then recurred.    Relevant past medical, surgical, family and social history reviewed and updated as indicated. Interim medical history since our last visit reviewed. Allergies and medications reviewed and updated. Outpatient Medications Prior to Visit  Medication Sig Dispense Refill  . Acetaminophen (TYLENOL PO) Take 3-4 tablets by mouth as needed (pain).    Marland Kitchen aspirin 81 MG EC tablet Take 1 tablet (81 mg total) by mouth daily.    . carvedilol (COREG) 6.25 MG tablet Take 1 tablet (6.25 mg total) by mouth 2 (two) times daily with a meal. 60 tablet 5  . cholecalciferol (VITAMIN D3) 25 MCG (1000 UNIT) tablet Take 2,000 Units by mouth daily.    . clopidogrel (PLAVIX) 75 MG tablet TAKE ONE TABLET BY MOUTH ONCE DAILY WITH BREAKFAST 90 tablet 3  . Continuous Blood Gluc Sensor (FREESTYLE LIBRE 2 SENSOR) MISC APPLY SENSOR EVERY 14 DAYS TO MONITOR SUGAR CONTINOUSLY 2 each 11  . Dulaglutide (TRULICITY) 4.5 MG/0.5ML SOPN Inject 4.5 mg into the skin once a week. Via [name] PAP    . empagliflozin (JARDIANCE) 10 MG TABS tablet Take 1 tablet (  10 mg total) by mouth daily. 30 tablet 5  . ezetimibe (ZETIA) 10 MG tablet TAKE 1 TABLET BY MOUTH ONCE DAILY 30 tablet 2  . furosemide (LASIX) 40 MG tablet TAKE ONE TABLET BY MOUTH ONCE A DAY CAN TAKE A 2ND DAILY DOSE AS NEEDED. 180 tablet 1  . guaiFENesin-dextromethorphan (ROBITUSSIN DM) 100-10 MG/5ML syrup Take 5 mLs by mouth every 4 (four) hours as needed for cough. 118 mL 0  . HYDROcodone-acetaminophen (NORCO) 5-325 MG tablet Take 1-2 tablets by mouth daily as needed for moderate pain. 60 tablet 0  . [START ON 01/17/2023] HYDROcodone-acetaminophen (NORCO) 5-325 MG tablet Take 1-2 tablets by mouth daily as needed for moderate pain. May 60 tablet 0  . HYDROcodone-acetaminophen (NORCO) 5-325 MG tablet Take 1-2 tablets by mouth daily as  needed for moderate pain. April 60 tablet 0  . hydrOXYzine (ATARAX) 10 MG tablet TAKE 1 TABLET BY MOUTH 3 TIMES DAILY AS NEEDED FOR ANXIETY 30 tablet 2  . insulin aspart (NOVOLOG FLEXPEN) 100 UNIT/ML FlexPen 13 units in AM (scheduled) and 3 units PRN in evening (Patient taking differently: Inject 3-13 Units into the skin in the morning and at bedtime. Inject 13 units in the morning and 3 units at night) 15 mL 6  . isosorbide mononitrate (IMDUR) 30 MG 24 hr tablet Take 1 tablet (30 mg total) by mouth 2 (two) times daily. 60 tablet 2  . nitroGLYCERIN (NITROSTAT) 0.4 MG SL tablet DISSOLVE 1 TABLET UNDER TONGUE AS NEEDEDFOR CHEST PAIN. MAY REPEAT 5 MINUTES APART 3 TIMES IF NEEDED 25 tablet 3  . sacubitril-valsartan (ENTRESTO) 24-26 MG Take 1 tablet by mouth 2 (two) times daily. 60 tablet 3  . spironolactone (ALDACTONE) 25 MG tablet Take 1 tablet (25 mg total) by mouth daily. 30 tablet 5  . TRESIBA FLEXTOUCH 100 UNIT/ML FlexTouch Pen INJECT 50 UNITS INTO THE SKIN DAILY 15 mL 2  . triamcinolone cream (KENALOG) 0.5 % APPLY ONE APPLICATION TOPICALLY TWO (TWO) TIMES DAILY. 30 g 0  . TRUEPLUS 5-BEVEL PEN NEEDLES 31G X 6 MM MISC USE TO INJECT INSULIN THREE TIMES A DAY 300 each 3  . venlafaxine XR (EFFEXOR-XR) 150 MG 24 hr capsule TAKE 1 CAPSULE BY MOUTH DAILY WITH BREAKFAST. TAKE WITH EFFEXOR XR 75MG  FORA TOTAL OF 225MG  (Patient taking differently: Take 150 mg by mouth daily with breakfast. Take with 75 mg) 90 capsule 0  . vitamin B-12 (CYANOCOBALAMIN) 1000 MCG tablet Take 1,000 mcg by mouth daily.     No facility-administered medications prior to visit.     Per HPI unless specifically indicated in ROS section below Review of Systems  Constitutional:  Negative for fatigue and fever.  HENT:  Negative for ear pain.   Eyes:  Negative for pain.  Respiratory:  Negative for cough and shortness of breath.   Cardiovascular:  Negative for chest pain, palpitations and leg swelling.  Gastrointestinal:  Negative for  abdominal pain.  Genitourinary:  Negative for dysuria.  Musculoskeletal:  Positive for arthralgias and back pain.  Neurological:  Positive for dizziness and weakness. Negative for syncope, light-headedness and headaches.  Psychiatric/Behavioral:  Negative for dysphoric mood.    Objective:  BP 136/72 (BP Location: Right Arm)   Pulse 74   Temp 97.7 F (36.5 C) (Temporal)   Ht 5\' 6"  (1.676 m)   Wt 254 lb (115.2 kg)   SpO2 98%   BMI 41.00 kg/m   Wt Readings from Last 3 Encounters:  01/10/23 254 lb (115.2 kg)  12/29/22  250 lb (113.4 kg)  12/19/22 255 lb (115.7 kg)      Physical Exam    Results for orders placed or performed during the hospital encounter of 12/19/22  Basic metabolic panel  Result Value Ref Range   Sodium 138 135 - 145 mmol/L   Potassium 4.3 3.5 - 5.1 mmol/L   Chloride 104 98 - 111 mmol/L   CO2 27 22 - 32 mmol/L   Glucose, Bld 149 (H) 70 - 99 mg/dL   BUN 27 (H) 8 - 23 mg/dL   Creatinine, Ser 1.61 0.61 - 1.24 mg/dL   Calcium 8.8 (L) 8.9 - 10.3 mg/dL   GFR, Estimated >09 >60 mL/min   Anion gap 7 5 - 15    Assessment and Plan  There are no diagnoses linked to this encounter.  No follow-ups on file.   Kerby Nora, MD

## 2023-01-10 NOTE — Patient Instructions (Addendum)
We will place a referral for wound care center for nonhealing wounds.   Wear diabetic shoes. Start oral antifungal and topical antifungal on rash on right leg... if not improving or resolving follow up with Dr. Gwen Pounds Dermat[facility].  We will move forward with scooter prescription to a home health group.

## 2023-01-12 DIAGNOSIS — R52 Pain, unspecified: Secondary | ICD-10-CM | POA: Insufficient documentation

## 2023-01-12 DIAGNOSIS — E11621 Type 2 diabetes mellitus with foot ulcer: Secondary | ICD-10-CM | POA: Insufficient documentation

## 2023-01-12 DIAGNOSIS — G629 Polyneuropathy, unspecified: Secondary | ICD-10-CM | POA: Insufficient documentation

## 2023-01-12 DIAGNOSIS — E11622 Type 2 diabetes mellitus with other skin ulcer: Secondary | ICD-10-CM | POA: Insufficient documentation

## 2023-01-12 NOTE — Assessment & Plan Note (Signed)
Recurrent In the past has improved with dermatology treatment with topical ketoconazole cream and terbinafine.  Will treat with terbinafine 250 mg p.o. daily x 30 days with reevaluation here or at dermatology.  LFTs within normal range at last check. He will also start topical ketoconazole 2% cream on rash daily.

## 2023-01-12 NOTE — Assessment & Plan Note (Signed)
Decreased mobility due to chronic low back pain, chronic leg weakness, morbid obesity, peripheral neuropathy and balance issues resulting in need for hover-round/power mobility device to use  in house.   PMD is necessary to perform ADLs at home including getting to bathroom to toilet, getting to kitchen to eat and cooking, getting to bedroom to dress etc.  He is out of balance using a cane.  Legs given put on him after 8 feet of walking. Can only stand 30 seconds with leaning on anything. He has fallen multiple times given leg weakness and balance issues.   He cannot use a cane/walker given his poor balance and a leg weakness.  His finger numbness  and cramping that makes it difficult propel a manual wheelchair. Also chronic low back pain limits his ablity to maneuver a manual wheel chair. A scooter would be able to fit in his house  He can operate a scooter safely, both mentally and physically.  He is willing and motivated to use the device.Marland Kitchen He would uses it daily during all waking hours.

## 2023-01-12 NOTE — Assessment & Plan Note (Signed)
Chronic back pain, chronic weakness, morbid obesity, peripheral neuropathy and balance issues contribute to an alteration in mobility requiring power mobility device.

## 2023-01-12 NOTE — Assessment & Plan Note (Signed)
Chronic, poor control.  This is contributing to his poor balance and instability.  This is contributed to frequent falls and need for power mobility device.

## 2023-01-12 NOTE — Assessment & Plan Note (Signed)
Chronic, nonhealing ulcers on right foot. In setting of diabetes and peripheral neuropathy. No clear associated infection. Will refer to wound care center for definitive treatment.

## 2023-01-19 ENCOUNTER — Ambulatory Visit: Payer: PPO | Admitting: Physician Assistant

## 2023-01-20 ENCOUNTER — Telehealth: Payer: Self-pay | Admitting: *Deleted

## 2023-01-20 NOTE — Telephone Encounter (Signed)
I think he just wants a scooter... idea mentioned to him by [facility] I believe... can you call and ask him to call his insurnace or contact his [employer] social worker and ask if they will help him determine what is covered.

## 2023-01-20 NOTE — Telephone Encounter (Signed)
[name] notified by telephone to contact his insurance to see what company they are contracted with that he could get a scooter from.  He states he will check with them next week and let us know what he finds out.   Will also send message to his [facility] Social Worker to see if she can help direct [place] on this.

## 2023-01-20 NOTE — Telephone Encounter (Signed)
Zenia Resides from [employer] returned Dr. Daphine Deutscher call about Scooter vs. Passenger transport manager.  She states [employer] does not provide Scooters for patient's insurance.  She states if he gets a Psychiatric nurse from them, patient would have to pay out of pocket for it.  She states she is willing to work with patient to get him an Mining engineer wheelchair if Dr. Ermalene Searing feels this is appropriate.   She states she does not know who or where patient can get a scooter from.  She states Mr. Bejarano would need to contact his insurance to ask them.  She states she patient's with back issues, upper extremity weakness or difficulty ambulating, have trouble with mounting the scooters because they have to step up on a platform to get on the scooter.  She also mentioned with upper extremity weakness patients have a hard time with steering a scooter since they have to hold their arm out to do so.  With back pain, there is posture support with the scooter and a wheelchair would be more stable.  Debbie states if Dr. Ermalene Searing would like to move forward with a wheelchair,  the current office note would not work because the office note would have to rule out why the patient could not use a scooter.  They would also go out to patient's home to access that the wheelchair would work in their home.

## 2023-01-25 ENCOUNTER — Other Ambulatory Visit (HOSPITAL_COMMUNITY): Payer: Self-pay

## 2023-01-25 NOTE — Progress Notes (Signed)
Paramedicine Encounter    Patient ID: [name], male    DOB: 1951-07-26, 72 y.o.   MRN: [id number]   Complaints-good days and bad days   Edema-rt leg as always   Compliance with meds-yes  Pill box filled-not used -he doesn't want to use pill box b/c he wants something to do each morning  If so, by whom-n/a  Refills needed-  Pt reports he is doing ok. He has good days and bad days. He reports his bad days are like he doesn't want to do anything, no energy, depressed, dont want to talk to anyone.   He has trouble walking. He reports its like his legs aren't strong and feel like they give out on him.  He has no license now due to his diabetic eye issues.   He is not interested in cardiac rehab or any kind of exercise plan, not interested in PT at home either.  Even if transportation is done for him, he is not interested.  I did relay all this info to Inetta Fermo at [facility]F clinic and see if she can talk to him some about it also.  [facility] could get his txp set up for him.  He is not interested in bubble packs right now b/c he wants something to do each morning.   He goes to wound doc next week with wounds on your feet.  He reports wounds aren't getting better or worse.  He does have rx for diabetic shoes, he is getting a friend to take him to get them.   He said if he had more energy he would get out more but he doesn't have a way-this could be more mentally-he seems depressed about his lack of ability to be able to do for himself like he used to anymore.  His legs look better than last time I seen him. The color looks better. He said he was taking medicine for that.  His rt leg is swollen some but it always does due to his knee issue.   Will f/u in a couple wks.   Weights ranging from 248-250  CBG EMS-163 BP 130/62   Pulse 82   Resp 16   Wt 250 lb (113.4 kg)   SpO2 98%   BMI 40.35 kg/m  Weight yesterday--250  Last visit weight-250   Patient Care Team: [name], MD as PCP -  General Mariah Milling, Tollie Pizza, MD as Consulting Physician (Cardiology) Kathyrn Sheriff, [facility] as Pharmacist (Pharmacist)  Patient Active Problem List   Diagnosis Date Noted   Alteration in mobility associated with pain 01/12/2023   Diabetic ulcer of ankle (HCC) 01/12/2023   Peripheral neuropathy 01/12/2023   NSTEMI (non-ST elevated myocardial infarction) (HCC) 11/05/2022   COVID-19 virus infection 11/05/2022   Penis disorder 05/16/2022   Other fatigue 05/16/2022   Rash 05/12/2022   GAD (generalized anxiety disorder) 09/21/2021   Encounter for power mobility device assessment 05/13/2021   Statin myopathy 03/30/2021   Chronic midline low back pain 11/12/2020   Tinea corporis 04/10/2018   ED (erectile dysfunction) 03/06/2018   Acute on chronic diastolic CHF (congestive heart failure) (HCC) 09/20/2016   Adjustment disorder with mixed anxiety and depressed mood 09/09/2016   Gastroesophageal reflux disease 09/09/2016   Elevated left ventricular end-diastolic pressure (LVEDP) 03/11/2016   Anemia 03/11/2016   Hypertensive heart disease with heart failure (HCC)    Diabetes mellitus type 2 with retinopathy (HCC)    Coronary artery disease of native artery of native  heart with stable angina pectoris (HCC)    Morbid obesity (HCC)    Chronic chest pain    Diabetic retinopathy (HCC) 09/23/2014   Snoring 12/21/2012   HYPOGONADISM 09/28/2010   B12 deficiency 09/28/2010   Vitamin D deficiency 09/28/2010   OTHER MALAISE AND FATIGUE 09/17/2010   TRIGGER FINGER, RIGHT MIDDLE 09/14/2010   BRANCH RETINAL VEIN OCCLUSION 11/26/2009   CONSTIPATION 08/10/2009   GASTRITIS 07/29/2009   Major depressive disorder, recurrent episode, moderate (HCC) 07/06/2007   RENAL CALCULUS, HX OF 03/26/2007   Hyperlipidemia associated with type 2 diabetes mellitus (HCC) 12/05/2006   Hypertension associated with diabetes (HCC) 12/05/2006   Osteoarthritis 12/05/2006    Current Outpatient Medications:     Acetaminophen (TYLENOL PO), Take 3-4 tablets by mouth as needed (pain)., Disp: , Rfl:    aspirin 81 MG EC tablet, Take 1 tablet (81 mg total) by mouth daily., Disp: , Rfl:    carvedilol (COREG) 6.25 MG tablet, Take 1 tablet (6.25 mg total) by mouth 2 (two) times daily with a meal., Disp: 60 tablet, Rfl: 5   cholecalciferol (VITAMIN D3) 25 MCG (1000 UNIT) tablet, Take 2,000 Units by mouth daily., Disp: , Rfl:    clopidogrel (PLAVIX) 75 MG tablet, TAKE ONE TABLET BY MOUTH ONCE DAILY WITH BREAKFAST, Disp: 90 tablet, Rfl: 3   Continuous Blood Gluc Sensor (FREESTYLE LIBRE 2 SENSOR) MISC, APPLY SENSOR EVERY 14 DAYS TO MONITOR SUGAR CONTINOUSLY, Disp: 2 each, Rfl: 11   Dulaglutide (TRULICITY) 4.5 MG/0.5ML SOPN, Inject 4.5 mg into the skin once a week. Via [name] PAP, Disp: , Rfl:    empagliflozin (JARDIANCE) 10 MG TABS tablet, Take 1 tablet (10 mg total) by mouth daily., Disp: 30 tablet, Rfl: 5   ezetimibe (ZETIA) 10 MG tablet, TAKE 1 TABLET BY MOUTH ONCE DAILY, Disp: 30 tablet, Rfl: 2   furosemide (LASIX) 40 MG tablet, TAKE ONE TABLET BY MOUTH ONCE A DAY CAN TAKE A 2ND DAILY DOSE AS NEEDED., Disp: 180 tablet, Rfl: 1   guaiFENesin-dextromethorphan (ROBITUSSIN DM) 100-10 MG/5ML syrup, Take 5 mLs by mouth every 4 (four) hours as needed for cough., Disp: 118 mL, Rfl: 0   hydrOXYzine (ATARAX) 10 MG tablet, TAKE 1 TABLET BY MOUTH 3 TIMES DAILY AS NEEDED FOR ANXIETY, Disp: 30 tablet, Rfl: 2   insulin aspart (NOVOLOG FLEXPEN) 100 UNIT/ML FlexPen, 13 units in AM (scheduled) and 3 units PRN in evening (Patient taking differently: Inject 3-13 Units into the skin in the morning and at bedtime. Inject 13 units in the morning and 3 units at night), Disp: 15 mL, Rfl: 6   isosorbide mononitrate (IMDUR) 30 MG 24 hr tablet, Take 1 tablet (30 mg total) by mouth 2 (two) times daily., Disp: 60 tablet, Rfl: 2   sacubitril-valsartan (ENTRESTO) 24-26 MG, Take 1 tablet by mouth 2 (two) times daily., Disp: 60 tablet, Rfl: 3    spironolactone (ALDACTONE) 25 MG tablet, Take 1 tablet (25 mg total) by mouth daily., Disp: 30 tablet, Rfl: 5   terbinafine (LAMISIL) 250 MG tablet, Take 1 tablet (250 mg total) by mouth daily., Disp: 30 tablet, Rfl: 0   TRESIBA FLEXTOUCH 100 UNIT/ML FlexTouch Pen, INJECT 50 UNITS INTO THE SKIN DAILY, Disp: 15 mL, Rfl: 2   triamcinolone cream (KENALOG) 0.5 %, APPLY ONE APPLICATION TOPICALLY TWO (TWO) TIMES DAILY., Disp: 30 g, Rfl: 0   TRUEPLUS 5-BEVEL PEN NEEDLES 31G X 6 MM MISC, USE TO INJECT INSULIN THREE TIMES A DAY, Disp: 300 each, Rfl: 3  venlafaxine XR (EFFEXOR-XR) 150 MG 24 hr capsule, TAKE 1 CAPSULE BY MOUTH DAILY WITH BREAKFAST. TAKE WITH EFFEXOR XR 75MG  FORA TOTAL OF 225MG  (Patient taking differently: Take 150 mg by mouth daily with breakfast. Take with 75 mg), Disp: 90 capsule, Rfl: 0   vitamin B-12 (CYANOCOBALAMIN) 1000 MCG tablet, Take 1,000 mcg by mouth daily., Disp: , Rfl:    HYDROcodone-acetaminophen (NORCO) 5-325 MG tablet, Take 1-2 tablets by mouth daily as needed for moderate pain., Disp: 60 tablet, Rfl: 0   HYDROcodone-acetaminophen (NORCO) 5-325 MG tablet, Take 1-2 tablets by mouth daily as needed for moderate pain. May, Disp: 60 tablet, Rfl: 0   HYDROcodone-acetaminophen (NORCO) 5-325 MG tablet, Take 1-2 tablets by mouth daily as needed for moderate pain. April, Disp: 60 tablet, Rfl: 0   ketoconazole (NIZORAL) 2 % cream, Apply 1 Application topically daily., Disp: 15 g, Rfl: 0   nitroGLYCERIN (NITROSTAT) 0.4 MG SL tablet, DISSOLVE 1 TABLET UNDER TONGUE AS NEEDEDFOR CHEST PAIN. MAY REPEAT 5 MINUTES APART 3 TIMES IF NEEDED, Disp: 25 tablet, Rfl: 3 Allergies  Allergen Reactions   Bupropion Nausea Only   Ozempic (0.25 Or 0.5 Mg-Dose) [Semaglutide(0.25 Or 0.5mg -Dos)] Nausea And Vomiting   Atorvastatin Other (See Comments)    Body aches Similar effect with rosuvastatin 40 mg twice weekly      Social History   Socioeconomic History   Marital status: Widowed    Spouse name:  Not on file   Number of children: 3   Years of education: Not on file   Highest education level: 7th grade  Occupational History   Occupation: disabled    Associate Professor: UNEMPLOYED    Comment: back injury  Tobacco Use   Smoking status: Never   Smokeless tobacco: Never  Vaping Use   Vaping Use: Never used  Substance and Sexual Activity   Alcohol use: No    Alcohol/week: 0.0 standard drinks of alcohol   Drug use: No   Sexual activity: Not Currently  Other Topics Concern   Not on file  Social History Narrative   Financial concerns, wife with depression.   Minimal exercise.   Diet: poor.   Social Determinants of Health   Financial Resource Strain: Low Risk  (11/23/2022)   Overall Financial Resource Strain (CARDIA)    Difficulty of Paying Living Expenses: Not hard at all  Food Insecurity: No Food Insecurity (12/29/2022)   Hunger Vital Sign    Worried About Running Out of Food in the Last Year: Never true    Ran Out of Food in the Last Year: Never true  Transportation Needs: No Transportation Needs (12/29/2022)   PRAPARE - Administrator, Civil Service (Medical): No    Lack of Transportation (Non-Medical): No  Physical Activity: Inactive (11/09/2022)   Exercise Vital Sign    Days of Exercise per Week: 0 days    Minutes of Exercise per Session: 0 min  Stress: No Stress Concern Present (11/09/2022)   [employer] of Occupational Health - Occupational Stress Questionnaire    Feeling of Stress : Only a little  Social Connections: Moderately Isolated (11/09/2022)   Social Connection and Isolation Panel [NHANES]    Frequency of Communication with Friends and Family: More than three times a week    Frequency of Social Gatherings with Friends and Family: More than three times a week    Attends Religious Services: More than 4 times per year    Active Member of Clubs or Organizations: No    Attends  Club or Organization Meetings: Never    Marital Status: Widowed  Intimate  Partner Violence: Not At Risk (11/09/2022)   Humiliation, Afraid, Rape, and Kick questionnaire    Fear of Current or Ex-Partner: No    Emotionally Abused: No    Physically Abused: No    Sexually Abused: No    Physical Exam      Future Appointments  Date Time Provider Department Center  01/30/2023  2:30 PM Delma Freeze, FNP [facility] None  01/30/2023  3:35 PM Charlsie Quest, NP [facility] None  01/31/2023  2:00 PM Allen Derry Corralitos III, PA-C [facility] None  02/14/2023  3:00 PM Coralyn Helling, MD [facility]  02/21/2023  3:20 PM [name], MD [facility]  11/14/2023 10:45 AM [facility] ANNUAL WELLNESS VISIT 1 [facility]       Kerry Hough, Paramedic [phone] [facility] Paramedic  01/25/23

## 2023-01-30 ENCOUNTER — Encounter: Payer: Self-pay | Admitting: Cardiology

## 2023-01-30 ENCOUNTER — Ambulatory Visit (HOSPITAL_BASED_OUTPATIENT_CLINIC_OR_DEPARTMENT_OTHER): Payer: PPO | Admitting: Family

## 2023-01-30 ENCOUNTER — Encounter: Payer: Self-pay | Admitting: Family

## 2023-01-30 ENCOUNTER — Ambulatory Visit: Payer: PPO | Attending: Cardiology | Admitting: Cardiology

## 2023-01-30 ENCOUNTER — Other Ambulatory Visit
Admission: RE | Admit: 2023-01-30 | Discharge: 2023-01-30 | Disposition: A | Discharge status: Home / Self Care | Payer: PPO | Source: Ambulatory Visit | Attending: Cardiology | Admitting: Cardiology

## 2023-01-30 VITALS — BP 125/79 | HR 78 | Ht 66.0 in | Wt 252.0 lb

## 2023-01-30 VITALS — BP 111/58 | HR 74 | Wt 253.0 lb

## 2023-01-30 DIAGNOSIS — E11319 Type 2 diabetes mellitus with unspecified diabetic retinopathy without macular edema: Secondary | ICD-10-CM

## 2023-01-30 DIAGNOSIS — I4891 Unspecified atrial fibrillation: Secondary | ICD-10-CM | POA: Diagnosis not present

## 2023-01-30 DIAGNOSIS — I10 Essential (primary) hypertension: Secondary | ICD-10-CM | POA: Diagnosis not present

## 2023-01-30 DIAGNOSIS — Z7985 Long-term (current) use of injectable non-insulin antidiabetic drugs: Secondary | ICD-10-CM

## 2023-01-30 DIAGNOSIS — E785 Hyperlipidemia, unspecified: Secondary | ICD-10-CM

## 2023-01-30 DIAGNOSIS — Z794 Long term (current) use of insulin: Secondary | ICD-10-CM | POA: Diagnosis not present

## 2023-01-30 DIAGNOSIS — I25118 Atherosclerotic heart disease of native coronary artery with other forms of angina pectoris: Secondary | ICD-10-CM | POA: Diagnosis not present

## 2023-01-30 DIAGNOSIS — E1169 Type 2 diabetes mellitus with other specified complication: Secondary | ICD-10-CM

## 2023-01-30 DIAGNOSIS — I5022 Chronic systolic (congestive) heart failure: Secondary | ICD-10-CM | POA: Insufficient documentation

## 2023-01-30 DIAGNOSIS — I5033 Acute on chronic diastolic (congestive) heart failure: Secondary | ICD-10-CM | POA: Diagnosis not present

## 2023-01-30 DIAGNOSIS — I2510 Atherosclerotic heart disease of native coronary artery without angina pectoris: Secondary | ICD-10-CM

## 2023-01-30 DIAGNOSIS — I480 Paroxysmal atrial fibrillation: Secondary | ICD-10-CM

## 2023-01-30 LAB — BASIC METABOLIC PANEL
Anion gap: 8 (ref 5–15)
BUN: 24 mg/dL — ABNORMAL HIGH (ref 8–23)
CO2: 27 mmol/L (ref 22–32)
Calcium: 9.1 mg/dL (ref 8.9–10.3)
Chloride: 106 mmol/L (ref 98–111)
Creatinine, Ser: 0.98 mg/dL (ref 0.61–1.24)
GFR, Estimated: >60 mL/min (ref >60)
Glucose, Bld: 85 mg/dL (ref 70–99)
Potassium: 4.4 mmol/L (ref 3.5–5.1)
Sodium: 141 mmol/L (ref 135–145)

## 2023-01-30 LAB — CBC
HCT: 40.7 % (ref 39.0–52.0)
Hemoglobin: 12.5 g/dL — ABNORMAL LOW (ref 13.0–17.0)
MCH: 26.5 pg (ref 26.0–34.0)
MCHC: 30.7 g/dL (ref 30.0–36.0)
MCV: 86.2 fL (ref 80.0–100.0)
Platelets: 256 10*3/uL (ref 150–400)
RBC: 4.72 MIL/uL (ref 4.22–5.81)
RDW: 16.8 % — ABNORMAL HIGH (ref 11.5–15.5)
WBC: 8.4 10*3/uL (ref 4.0–10.5)
nRBC: 0 % (ref 0.0–0.2)

## 2023-01-30 LAB — TSH: TSH: 2.224 u[IU]/mL (ref 0.350–4.500)

## 2023-01-30 MED ORDER — APIXABAN 5 MG PO TABS: 5.0000 mg | ORAL_TABLET | Freq: Two times a day (BID) | ORAL | 11 refills | Status: DC

## 2023-01-30 NOTE — Patient Instructions (Signed)
Medication Instructions:  Your physician has recommended you make the following change in your medication:   STOP Aspirin START Eliquis 5 mg twice a day  *If you need a refill on your cardiac medications before your next appointment, please call your pharmacy*   Lab Work: TSH, CBC, and BMP today over at the [facility]. Stop at registration desk to check in.   If you have labs (blood work) drawn today and your tests are completely normal, you will receive your results only by: MyChart Message (if you have MyChart) OR A paper copy in the mail If you have any lab test that is abnormal or we need to change your treatment, we will call you to review the results.   Testing/Procedures: None   Follow-Up: At [facility], you and your health needs are our priority.  As part of our continuing mission to provide you with exceptional heart care, we have created designated Provider Care Teams.  These Care Teams include your primary Cardiologist (physician) and Advanced Practice Providers (APPs -  Physician Assistants and Nurse Practitioners) who all work together to provide you with the care you need, when you need it.   Your next appointment:   3 week(s)  Provider:   Julien Nordmann, MD or Charlsie Quest, NP

## 2023-01-30 NOTE — Progress Notes (Signed)
PCP: William Seltzer, MD (last seen 05/24) Primary Cardiologist: William Nordmann, MD (last seen 06/23)  HPI:  [name] is a 72 y/o male with a history of NSTEMI 04/17 (PCI to LAD; was discharged on Plavix) and again 02/2016 (no PCI; was discharged on Brilinta), Again presented to [facility] ER on 04/05/16 for NSTEMI (no PCI), chronic total occlusion of RCA, DM, hyperlipidemia, HTN, CKD, depression and chronic heart failure.   Echo 11/06/22: EF 40-45% along with mild/moderate William  LHC 04/06/16:  Mid RCA to Dist RCA lesion, 100 %stenosed. Mid LAD-2 lesion, 60 %stenosed. Mid LAD-1 lesion, 20 %stenosed. The left ventricular systolic function is normal. The left ventricular ejection fraction is 50-55% by visual estimate. LV end diastolic pressure is normal. 2nd [name] lesion, 75 %stenosed.  Chronic total occlusion of the right coronary in the proximal segment well collateralized from the left circumflex and LAD. Widely patent proximal to mid LAD stent previously placed in July. There is first diagonal diagonal and LAD 30 and 50% narrowing respectively. Widely patent circumflex unchanged from previous with 70% narrowing in a small branch of the first marginal. Circumflex collateralizes the distal right coronary left ventricular branch. Inferobasal hypokinesis. EF 50%. EDP is normal.  Admitted 11/05/22 due to nausea, vomiting and dyspnea for the last 3 days. Noted to have fever and tested covid +. CTA negative for PE but showed heart failure. 1 dose of IV lasix given. Started on remdesivir, Decadron and bronchodilators.   He presents today for a HF f/u visit with a chief complaint of moderate SOB with minimal exertion.  Chronic in nature. Has associated right lower leg swelling, right knee pain, weakness & dizziness with sudden position changes along with this. Reports sleeping well on 1 pillow. Denies cough, chest pain, palpitations, abdominal distention, weight gain or difficulty sleeping.   Participating in  paramedicine program and was last seen by her 01/25/23. She's been discussing with him possible referral to PT or cardiac rehab but he declines.   ROS: All systems negative except as listed in HPI, PMH and Problem List.  SH:  Social History   Socioeconomic History   Marital status: Widowed    Spouse name: Not on file   Number of children: 3   Years of education: Not on file   Highest education level: 7th grade  Occupational History   Occupation: disabled    Associate Professor: UNEMPLOYED    Comment: back injury  Tobacco Use   Smoking status: Never   Smokeless tobacco: Never  Vaping Use   Vaping Use: Never used  Substance and Sexual Activity   Alcohol use: No    Alcohol/week: 0.0 standard drinks of alcohol   Drug use: No   Sexual activity: Not Currently  Other Topics Concern   Not on file  Social History Narrative   Financial concerns, wife with depression.   Minimal exercise.   Diet: poor.   Social Determinants of Health   Financial Resource Strain: Low Risk  (11/23/2022)   Overall Financial Resource Strain (CARDIA)    Difficulty of Paying Living Expenses: Not hard at all  Food Insecurity: No Food Insecurity (12/29/2022)   Hunger Vital Sign    Worried About Running Out of Food in the Last Year: Never true    Ran Out of Food in the Last Year: Never true  Transportation Needs: No Transportation Needs (12/29/2022)   PRAPARE - Administrator, Civil Service (Medical): No    Lack of Transportation (Non-Medical): No  Physical Activity: Inactive (11/09/2022)   Exercise Vital Sign    Days of Exercise per Week: 0 days    Minutes of Exercise per Session: 0 min  Stress: No Stress Concern Present (11/09/2022)   [employer] of Occupational Health - Occupational Stress Questionnaire    Feeling of Stress : Only a little  Social Connections: Moderately Isolated (11/09/2022)   Social Connection and Isolation Panel [NHANES]    Frequency of Communication with Friends and Family:  More than three times a week    Frequency of Social Gatherings with Friends and Family: More than three times a week    Attends Religious Services: More than 4 times per year    Active Member of [employer] or Organizations: No    Attends Banker Meetings: Never    Marital Status: Widowed  Intimate Partner Violence: Not At Risk (11/09/2022)   Humiliation, Afraid, Rape, and Kick questionnaire    Fear of Current or Ex-Partner: No    Emotionally Abused: No    Physically Abused: No    Sexually Abused: No    FH:  Family History  Problem Relation Age of Onset   Alzheimer's disease Mother    Emphysema Mother    Diabetes Father    Heart disease Father        MI   Cancer Brother        ? Neck cancer    Past Medical History:  Diagnosis Date   Back injury 02/2002   worker's comp   CHF (congestive heart failure) (HCC)    Coronary artery disease, non-occlusive    a. cath 2002 with no sig CAD;  b. cath 2008 normal LM, LAD, LCx, p&dRCA 20-30%, PDA 30%; c.11/2015 NSTEMI/PCI: LM nl, LAD 95p (2.5x15 Xience DES), LCX nl, RCA 100p/m w/ L->R collats, EF 55-65% c. NSTEMI (02/2016) with no culprit leision, switched to Brilinta.  d. NSTEMI 03/2016: again, no culprit lesion and switched back to plavix 2/2 SOB with Brilnta.     Depression    Diabetes mellitus type 2, insulin dependent (HCC)    Hyperlipemia    Hypertension    Hypertensive heart disease    Kidney stones    Morbid obesity (HCC)    Osteoarthritis    Snoring     Current Outpatient Medications  Medication Sig Dispense Refill   Acetaminophen (TYLENOL PO) Take 3-4 tablets by mouth as needed (pain).     aspirin 81 MG EC tablet Take 1 tablet (81 mg total) by mouth daily.     carvedilol (COREG) 6.25 MG tablet Take 1 tablet (6.25 mg total) by mouth 2 (two) times daily with a meal. 60 tablet 5   cholecalciferol (VITAMIN D3) 25 MCG (1000 UNIT) tablet Take 2,000 Units by mouth daily.     clopidogrel (PLAVIX) 75 MG tablet TAKE ONE TABLET  BY MOUTH ONCE DAILY WITH BREAKFAST 90 tablet 3   Continuous Blood Gluc Sensor (FREESTYLE LIBRE 2 SENSOR) MISC APPLY SENSOR EVERY 14 DAYS TO MONITOR SUGAR CONTINOUSLY 2 each 11   Dulaglutide (TRULICITY) 4.5 MG/0.5ML SOPN Inject 4.5 mg into the skin once a week. Via [employer] PAP     empagliflozin (JARDIANCE) 10 MG TABS tablet Take 1 tablet (10 mg total) by mouth daily. 30 tablet 5   ezetimibe (ZETIA) 10 MG tablet TAKE 1 TABLET BY MOUTH ONCE DAILY 30 tablet 2   furosemide (LASIX) 40 MG tablet TAKE ONE TABLET BY MOUTH ONCE A DAY CAN TAKE A 2ND DAILY DOSE AS  NEEDED. 180 tablet 1   guaiFENesin-dextromethorphan (ROBITUSSIN DM) 100-10 MG/5ML syrup Take 5 mLs by mouth every 4 (four) hours as needed for cough. 118 mL 0   HYDROcodone-acetaminophen (NORCO) 5-325 MG tablet Take 1-2 tablets by mouth daily as needed for moderate pain. 60 tablet 0   HYDROcodone-acetaminophen (NORCO) 5-325 MG tablet Take 1-2 tablets by mouth daily as needed for moderate pain. May 60 tablet 0   HYDROcodone-acetaminophen (NORCO) 5-325 MG tablet Take 1-2 tablets by mouth daily as needed for moderate pain. April 60 tablet 0   hydrOXYzine (ATARAX) 10 MG tablet TAKE 1 TABLET BY MOUTH 3 TIMES DAILY AS NEEDED FOR ANXIETY 30 tablet 2   insulin aspart (NOVOLOG FLEXPEN) 100 UNIT/ML FlexPen 13 units in AM (scheduled) and 3 units PRN in evening (Patient taking differently: Inject 3-13 Units into the skin in the morning and at bedtime. Inject 13 units in the morning and 3 units at night) 15 mL 6   isosorbide mononitrate (IMDUR) 30 MG 24 hr tablet Take 1 tablet (30 mg total) by mouth 2 (two) times daily. 60 tablet 2   ketoconazole (NIZORAL) 2 % cream Apply 1 Application topically daily. 15 g 0   nitroGLYCERIN (NITROSTAT) 0.4 MG SL tablet DISSOLVE 1 TABLET UNDER TONGUE AS NEEDEDFOR CHEST PAIN. MAY REPEAT 5 MINUTES APART 3 TIMES IF NEEDED 25 tablet 3   sacubitril-valsartan (ENTRESTO) 24-26 MG Take 1 tablet by mouth 2 (two) times daily. 60 tablet 3    spironolactone (ALDACTONE) 25 MG tablet Take 1 tablet (25 mg total) by mouth daily. 30 tablet 5   terbinafine (LAMISIL) 250 MG tablet Take 1 tablet (250 mg total) by mouth daily. 30 tablet 0   TRESIBA FLEXTOUCH 100 UNIT/ML FlexTouch Pen INJECT 50 UNITS INTO THE SKIN DAILY 15 mL 2   triamcinolone cream (KENALOG) 0.5 % APPLY ONE APPLICATION TOPICALLY TWO (TWO) TIMES DAILY. 30 g 0   TRUEPLUS 5-BEVEL PEN NEEDLES 31G X 6 MM MISC USE TO INJECT INSULIN THREE TIMES A DAY 300 each 3   venlafaxine XR (EFFEXOR-XR) 150 MG 24 hr capsule TAKE 1 CAPSULE BY MOUTH DAILY WITH BREAKFAST. TAKE WITH EFFEXOR XR 75MG  FORA TOTAL OF 225MG  (Patient taking differently: Take 150 mg by mouth daily with breakfast. Take with 75 mg) 90 capsule 0   vitamin B-12 (CYANOCOBALAMIN) 1000 MCG tablet Take 1,000 mcg by mouth daily.     No current facility-administered medications for this visit.   Vitals:   01/30/23 1431  BP: (!) 111/58  Pulse: 74  SpO2: 99%  Weight: 253 lb (114.8 kg)   Wt Readings from Last 3 Encounters:  01/30/23 253 lb (114.8 kg)  01/25/23 250 lb (113.4 kg)  01/10/23 254 lb (115.2 kg)   Lab Results  Component Value Date   CREATININE 1.00 12/19/2022   CREATININE 1.03 11/21/2022   CREATININE 1.06 11/08/2022   PHYSICAL EXAM:  General:  Well appearing. No resp difficulty HEENT: normal Neck: supple. JVP flat. No lymphadenopathy or thryomegaly appreciated. Cor: PMI normal. Regular rate, irregular rhythm. No rubs, gallops or murmurs. Lungs: clear Abdomen: soft, nontender, nondistended. No hepatosplenomegaly. No bruits or masses.  Extremities: no cyanosis, clubbing, rash, trace pitting edema right lower leg Neuro: alert & orientedx3, cranial nerves grossly intact. Moves all 4 extremities w/o difficulty. Affect pleasant.   ECG: in office today shows a fib, rate 75   ASSESSMENT & PLAN:  1: Ischemic cardiomyopathy with reduced ejection fraction- - NYHA class III - euvolemic today - weighing daily;  reminded to call for an overnight weight gain of > 2 pounds or a weekly weight gain of > 5 pounds - weight down 2 pounds from last visit here 6 weeks ago - echo 11/06/22: EF 40-45% along with mild/moderate William - NAS but does like to eat out at [employer]; eating out less often now than he used to and we reviewed how to make better sodium choices when he eats out - discussed keeping his daily fluid intake to 60-64 oz - continue carvedilol 6.25mg  BID - continue jardiance 10mg  daily - continue furosemide 40mg  daily - continue entresto 24/26mg  BID; consider titrating this at his next visit - continue spironolactone 25mg  daily - participating in paramedicine program; last seen 01/25/23 - discussed cardiac rehab with patient but he defers at this time; did discuss that his insurance should be able to provide transportation and that he could let us know if he becomes interested.  - BNP 11/05/22 was 1240.5  2: HTN- - BP 111/58 - saw PCP [name]) 05/24 - BMP 12/19/22 showed sodium 138, potassium 4.3, creatinine 1.00 & GFR >60  3: Diabetes- - A1c 11/17/22 was 7.2%  4: CAD- - saw cardiology [name]) 06/23 - LHC 04/06/16:  Mid RCA to Dist RCA lesion, 100 %stenosed. Mid LAD-2 lesion, 60 %stenosed. Mid LAD-1 lesion, 20 %stenosed. The left ventricular systolic function is normal. The left ventricular ejection fraction is 50-55% by visual estimate. LV end diastolic pressure is normal. 2nd [name] lesion, 75 %stenosed.  Chronic total occlusion of the right coronary in the proximal segment well collateralized from the left circumflex and LAD.  5: Atrial fibrillation- - new onset with HR 75 - to see cardiology later today  Return in 2 months, sooner if needed.

## 2023-01-30 NOTE — Progress Notes (Signed)
Cardiology Office Note:   Date:  01/30/2023  ID:  [name], DOB [birth year], MRN [id number] PCP: [name], MD  [facility] Providers Cardiologist:  Julien Nordmann, MD    History of Present Illness:   [name] is a 72 y.o. male with a past medical history of combined systolic and diastolic congestive heart failure,  hypertension, coronary artery disease (PCI to LAD in the past, CTO RCA), diabetes, obesity, NSTEMI, recent COVID 19 infection, tachycardia, who is here today for hospital follow-up.  He was admitted 3//24 to [facility] with complaints of nausea, vomiting, and dyspnea over the last 3 days.  Patient stated he was prescribed alternative insulin and Ozempic by his PCP on Wednesday to help aid with his weight loss and better control of his diabetes.  Upon using the first dose he felt sick to his stomach and had about 5 episodes of nausea and vomiting.  He subsequently then developed and dyspnea which worsened over the last couple of days.  He also complained of midsternal chest discomfort that he rated 4/10, described it as a dull aching that was nonradiating which starting after the vomiting episodes and he feels it was really related to his retching and dry heaves.  Emergency department he was found to be febrile with a temperature of 102.4 and tachycardic and tachypneic on arrival.  Blood pressure was 164/88.  Labs revealed WBCs of six 6.8, hemoglobin 11.1, hematocrit 35.6, platelets of 202, sodium 137, potassium 3.8, high-sensitivity troponin 1268 and 1880, BNP 1240, respiratory panel positive for COVID-19.  Due to fever, tachycardia, and elevated lactic acid patient was started on sepsis protocol with broad-spectrum antibiotics and IV fluid resuscitation.  His D-dimer was elevated as he was sent for CT of the chest PE protocol which was negative for PE but showed pleural effusions.  Given elevated BNP and troponin cardiology was consulted where he was started on a  heparin infusion for minimum of 48 hours was also started on IV Lasix and IV Decadron in the emergency department.  He was also treated with remdesivir. Echocardiogram was ordered which revealed global hypokinesis with an LVEF of 40-45%, G2 DD, mild to moderate MR.  Recommendation was NSTEMI was low likely due to demand ischemia from COVID-19.  He was continued on DAPT.  Would only consider cath if he develops symptoms once he was over his acute illness.  For his acute combined systolic and diastolic CHF he was placed on carvedilol, Jardiance, Lasix, losartan, and spironolactone.  Blood pressure was marginal at that time the recommendation was made to transition losartan to Entresto once blood pressure allowed.  He was considered stable for discharge and was subsequently discharged from the facility on 11/08/2022.  He has been continued to be followed by advanced heart failure clinic and last time he was seen was on 12/19/2022 by [facility].  He was transition from losartan to [facility] during his visits with heart failure.  Weight had slowly started to climb from his discharge to the last date he was seen and weights been measured at 248 on 11/21/2022 up to 255 on 12/19/2022.  He returns to clinic today after being upstairs heart failure clinic and found to be in new onset atrial fibrillation.  He endorses fatigue, chronic shortness of breath, peripheral edema with the right being greater than the left.  Denies any chest pain, lightheadedness/dizziness, tachycardia, palpitations, abdominal swelling, early CAD, PND and orthopnea.  He was last seen in the  hospital back in March where he had medical treatment for NSTEMI and was found incidentally to be positive for COVID.  He denies any recurrent chest discomfort today.  States that he has been compliant with his medications.  Denies any recent hospitalizations or visits to the emergency department.  States that his weights have been stable as well.  ROS: 10 point  review of systems has been reviewed and considered negative with exception of what is been listed in the HPI  Studies Reviewed:    EKG: Rate controlled atrial fibrillation, left axis deviation, nonspecific T wave changes,  TTE 11/06/22 1. Left ventricular ejection fraction, by estimation, is 40 to 45%. The  left ventricle has mildly decreased function. The left ventricle  demonstrates global hypokinesis. Left ventricular diastolic parameters are  consistent with Grade II diastolic  dysfunction (pseudonormalization). Elevated left atrial pressure.   2. Right ventricular systolic function is normal. The right ventricular  size is normal. Tricuspid regurgitation signal is inadequate for assessing  PA pressure.   3. The mitral valve is abnormal. Mild to moderate mitral valve  regurgitation. No evidence of mitral stenosis.   4. The aortic valve has an indeterminant number of cusps. There is mild  calcification of the aortic valve. There is mild thickening of the aortic  valve. Aortic valve regurgitation is not visualized. No aortic stenosis is  present.   5. Aortic dilatation noted. There is mild dilatation of the ascending  aorta, measuring 38 mm.   Lexiscan MPI 10/25/2017 Pharmacological myocardial perfusion imaging study with large region of fixed perfusion defect in the inferior wall with mild peri-infarct ischemia in the inferolateral region Inferior wall hypokinesis.  EF estimated at 24% No EKG changes concerning for ischemia at peak stress or in recovery. Moderate  risk scan based on low EF and large fixed defect Patient has known RCA occlusion consistent with inferior wall perfusion defect.  Consider echocardiogram to confirm EF  LHC 04/06/2016 Mid RCA to Dist RCA lesion, 100 %stenosed. Mid LAD-2 lesion, 60 %stenosed. Mid LAD-1 lesion, 20 %stenosed. The left ventricular systolic function is normal. The left ventricular ejection fraction is 50-55% by visual estimate. LV end  diastolic pressure is normal. 2nd [name] lesion, 75 %stenosed.   Chronic total occlusion of the right coronary in the proximal segment well collateralized from the left circumflex and LAD. Widely patent proximal to mid LAD stent previously placed in July. There is first diagonal diagonal and LAD 30 and 50% narrowing respectively. Widely patent circumflex unchanged from previous with 70% narrowing in a small branch of the first marginal. Circumflex collateralizes the distal right coronary left ventricular branch. Inferobasal hypokinesis. EF 50%. EDP is normal. Unable to identify a significant change in the angiographic appearance since prior study in July.   RECOMMENDATIONS:   Continuation of dual antiplatelet therapy is stressed. Further management per treating team.  Risk Assessment/Calculations:              Physical Exam:   VS:  BP 125/79 (BP Location: Left Arm, Patient Position: Sitting, Cuff Size: Normal)   Pulse 78   Ht 5\' 6"  (1.676 m)   Wt 252 lb (114.3 kg)   SpO2 98%   BMI 40.67 kg/m    Wt Readings from Last 3 Encounters:  01/30/23 252 lb (114.3 kg)  01/30/23 253 lb (114.8 kg)  01/25/23 250 lb (113.4 kg)     GEN: Well nourished, well developed in no acute distress NECK: No JVD; No carotid bruits CARDIAC: IR  IR, no murmurs, rubs, gallops RESPIRATORY:  Clear to auscultation without rales, wheezing or rhonchi  ABDOMEN: Soft, non-tender, non-distended EXTREMITIES: Trace edema; No deformity   ASSESSMENT AND PLAN:   New onset atrial fibrillation-heart failure clinic.  Review of recent hospitalization EKG patient was not in atrial fibrillation in March of this year.  He remains rate controlled on carvedilol 6.25 mg twice daily.  With recent elevated high-sensitivity troponin with heparin x 48 hours in the hospital he is continued on Plavix 75 mg daily, we are holding his aspirin due to issues with previous anemia, and started him on apixaban 5 mg twice daily for CHA2DS2-VASc  score of at least 5 for stroke prophylaxis.  Discussed that he will stay on anticoagulation for minimum of 3 to 4 weeks and if he remains in atrial fibrillation without converting his self will discuss doing a direct-current cardioversion procedure.  Coronary artery disease with recent NSTEMI 10/2022 with peak troponin 1880.  Known history of PCI to LAD and CTO of the RCA.  Patient was medically treated in the hospital with IV heparin infusion for 48 hours.  EF was mildly globally depressed.  Thought it was likely from demand ischemia from COVID-19 infection.  He was continued on DAPT of aspirin 81 mg daily and clopidogrel 75 mg daily as well this Imdur mg daily for 30 mg twice daily.  EKG with no ischemic changes noted.  The patient continues to deny any chest discomfort.  Combined systolic and diastolic congestive heart failure with an LVEF of 40-45% during recent hospitalization, G2 DD, mild to moderate mitral valve regurgitation.  He continues to follow-up with outpatient clinic.  Is continued on carvedilol 6.25 mg twice daily, Jardiance 10 mg daily, furosemide 40 mg daily and the second dose daily as needed for weight gain of 2 pounds or greater, Entresto 24/26 mg twice daily and spironolactone 25 mg daily.  He is encouraged to continue to watch his sodium and fluid intake and continues to weigh self daily.  Mixed hyperlipidemia with last LDL of 56.  He is continued on Zetia 10 mg daily.  He has not noticed statin intolerance.  Will need a lipid panel on return.  With extensive coronary artery disease depending on LDL can consider PCSK9 inhibitor.  Type 2 diabetes.  Patient was previously on Ozempic did not tolerate therapy.  He is back on [place] as well as his his NovoLog and [place].  This continues to be managed by his PCP.  Hypertension with blood pressure today 125/79.  Blood pressure remained stable.  He is continued on carvedilol, Lasix, and Imdur.  Encouraged to continue to  monitor blood pressures at home.  Disposition patient return to clinic to see MD/APP in 3 weeks or sooner if determined if he continues to be in atrial fibrillation and if there is need for an elective cardioversion procedure.        CHA2DS2-VASc Score = 5   This indicates a 7.2% annual risk of stroke. [name] score is based upon: CHF History: 1 HTN History: 1 Diabetes History: 1 Stroke History: 0 Vascular Disease History: 1 Age Score: 1 Gender Score: 0     Signed, Ksenia Kunz, NP

## 2023-01-31 ENCOUNTER — Encounter: Payer: PPO | Attending: Physician Assistant | Admitting: Physician Assistant

## 2023-01-31 ENCOUNTER — Telehealth: Payer: Self-pay | Admitting: Cardiovascular Disease

## 2023-01-31 DIAGNOSIS — Z7901 Long term (current) use of anticoagulants: Secondary | ICD-10-CM | POA: Diagnosis not present

## 2023-01-31 DIAGNOSIS — E1140 Type 2 diabetes mellitus with diabetic neuropathy, unspecified: Secondary | ICD-10-CM | POA: Diagnosis not present

## 2023-01-31 DIAGNOSIS — E11621 Type 2 diabetes mellitus with foot ulcer: Secondary | ICD-10-CM | POA: Insufficient documentation

## 2023-01-31 DIAGNOSIS — L97512 Non-pressure chronic ulcer of other part of right foot with fat layer exposed: Secondary | ICD-10-CM | POA: Diagnosis not present

## 2023-01-31 DIAGNOSIS — I2510 Atherosclerotic heart disease of native coronary artery without angina pectoris: Secondary | ICD-10-CM | POA: Diagnosis not present

## 2023-01-31 DIAGNOSIS — I480 Paroxysmal atrial fibrillation: Secondary | ICD-10-CM | POA: Insufficient documentation

## 2023-01-31 DIAGNOSIS — I10 Essential (primary) hypertension: Secondary | ICD-10-CM | POA: Diagnosis not present

## 2023-01-31 DIAGNOSIS — L97412 Non-pressure chronic ulcer of right heel and midfoot with fat layer exposed: Secondary | ICD-10-CM | POA: Diagnosis not present

## 2023-01-31 MED ORDER — APIXABAN 5 MG PO TABS: 5.0000 mg | ORAL_TABLET | Freq: Two times a day (BID) | ORAL | 0 refills | Status: DC

## 2023-01-31 NOTE — Telephone Encounter (Signed)
Pt c/o medication issue:  1. Name of Medication:   apixaban (ELIQUIS) 5 MG TABS tablet   2. How are you currently taking this medication (dosage and times per day)?     3. Are you having a reaction (difficulty breathing--STAT)?   4. What is your medication issue?   Daughter stated patient was prescribed this medication and it is too expensive and wants to get assistance getting this medication.

## 2023-01-31 NOTE — Telephone Encounter (Signed)
Pt is returning call.  

## 2023-01-31 NOTE — Progress Notes (Signed)
Kidney function and electrolytes are stable. Blood counts have improved to 12.5. Continue current medication regimen without changes and keep follow up appointments.

## 2023-01-31 NOTE — Telephone Encounter (Signed)
Left voicemail message to call back  

## 2023-01-31 NOTE — Telephone Encounter (Signed)
Spoke with patients daughter. Reviewed cost of medication and assistance application. Reviewed that we need one page signed by patient then income and out of pocket expense report from his pharmacy. Advised that I would leave some samples up front when he comes in to sign the application. She verbalized understanding of our conversation and will bring all information in. Samples are available as well.   Medication Samples have been provided to the patient.  Drug name: Eliquis        Strength: 5 mg          Qty: 4 boxes   LOT: [id number]   Exp.Date: 04/2024

## 2023-02-01 NOTE — Progress Notes (Signed)
JC, GINDHART ([id number]) [id number] Page 1 of 4 Visit Report for 01/31/2023 Abuse Risk Screen Details Patient Name: Date of Service: [name] [facility] H. 01/31/2023 2:00 PM Medical Record Number: [id number] Patient Account Number: [id number] Date of Birth/Sex: Treating RN: [birth year] (72 y.o. Male) Midge Aver Primary Care Zyire Eidson: Kerby Nora Other Clinician: Referring Rishi Vicario: Treating Lilliana Turner/Extender: [name], [name] in Treatment: 0 Abuse Risk Screen Items Answer Electronic Signature(s) Signed: 01/31/2023 4:45:25 PM By: Midge Aver MSN RN CNS WTA Entered By: Midge Aver on 01/31/2023 14:29:17 -------------------------------------------------------------------------------- Activities of Daily Living Details Patient Name: Date of Service: [name] [facility] H. 01/31/2023 2:00 PM Medical Record Number: [id number] Patient Account Number: [id number] Date of Birth/Sex: Treating RN: [birth year] (72 y.o. Male) Midge Aver Primary Care Jaskarn Schweer: Kerby Nora Other Clinician: Referring Shanya Ferriss: Treating Lissa Rowles/Extender: [name], [name] in Treatment: 0 Activities of Daily Living Items Answer Activities of Daily Living (Please select one for each item) Drive Automobile Not Able T Medications ake Completely Able Use T elephone Completely Able Care for Appearance Completely Able Use T oilet Completely Able Bath / Shower Completely Able Dress Self Completely Able Feed Self Completely Able Walk Completely Able Get In / Out Bed Completely Able Housework Completely Able Prepare Meals Completely Able Handle Money Completely Able Shop for Self Completely Able Electronic Signature(s) Signed: 01/31/2023 4:45:25 PM By: Midge Aver MSN RN CNS WTA Entered By: Midge Aver on 01/31/2023 14:29:39 -------------------------------------------------------------------------------- Education Screening Details Patient Name: Date of  Service: [name]. 01/31/2023 2:00 PM Medical Record Number: [id number] Patient Account Number: [id number] Date of Birth/Sex: Treating RN: 1951-07-04 (72 y.o. Male) Midge Aver Primary Care Alexandro Line: Kerby Nora Other Clinician: Referring Kristin Barcus: Treating Shenequa Howse/Extender: [name], [name] in Treatment: 0 Primary Learner Assessed: Patient Learning Preferences/Education Level/Primary Language BAXLEY, HEMP ([id number]) [id number] Page 2 of 4 Learning Preference: Explanation, Demonstration Highest Education Level: Grade School Preferred Language: English Cognitive Barrier Language Barrier: No Translator Needed: No Memory Deficit: No Emotional Barrier: No Cultural/Religious Beliefs Affecting Medical Care: No Physical Barrier Impaired Vision: Yes Glasses, reading Impaired Hearing: No Decreased Hand dexterity: No Knowledge/Comprehension Knowledge Level: High Comprehension Level: High Ability to understand written instructions: High Ability to understand verbal instructions: High Motivation Anxiety Level: Calm Cooperation: Cooperative Education Importance: Acknowledges Need Interest in Health Problems: Asks Questions Perception: Coherent Willingness to Engage in Self-Management High Activities: Readiness to Engage in Self-Management High Activities: Electronic Signature(s) Signed: 01/31/2023 4:45:25 PM By: Midge Aver MSN RN CNS WTA Entered By: Midge Aver on 01/31/2023 14:30:52 -------------------------------------------------------------------------------- Fall Risk Assessment Details Patient Name: Date of Service: [name]. 01/31/2023 2:00 PM Medical Record Number: [id number] Patient Account Number: [id number] Date of Birth/Sex: Treating RN: Apr 01, 1951 (72 y.o. Male) Midge Aver Primary Care Oval Cavazos: Kerby Nora Other Clinician: Referring Dewanna Hurston: Treating Fartun Paradiso/Extender: [name],  [name] in Treatment: 0 Fall Risk Assessment Items Have you had 2 or more falls in the last 12 monthso 0 No Have you had any fall that resulted in injury in the last 12 monthso 0 No FALLS RISK SCREEN History of falling - immediate or within 3 months 0 No Secondary diagnosis (Do you have 2 or more medical diagnoseso) 0 No Ambulatory aid None/bed rest/wheelchair/nurse 0 No Crutches/cane/walker 15 Yes Furniture 0 No Intravenous therapy Access/Saline/Heparin Lock 0 No Gait/Transferring Normal/ bed rest/ wheelchair 0 No Weak (short steps with or without shuffle, stooped but able to lift head while walking, may seek  0 No support from furniture) Impaired (short steps with shuffle, may have difficulty arising from chair, head down, impaired 0 No balance) Mental Status Oriented to own ability 0 Yes Electronic Signature(s) Signed: 01/31/2023 4:45:25 PM By: Midge Aver MSN RN CNS WTA Entered By: Midge Aver on 01/31/2023 14:31:19 KHALE, BREY ([id number]) [phone] [id number] Page 3 of 4 -------------------------------------------------------------------------------- Foot Assessment Details Patient Name: Date of Service: [name] [facility] H. 01/31/2023 2:00 PM Medical Record Number: [id number] Patient Account Number: [id number] Date of Birth/Sex: Treating RN: [birth year] (72 y.o. Male) Midge Aver Primary Care Selby Foisy: Kerby Nora Other Clinician: Referring Sevannah Madia: Treating Jakwon Gayton/Extender: [name], [name] in Treatment: 0 Foot Assessment Items Site Locations + = Sensation present, - = Sensation absent, C = Callus, U = Ulcer R = Redness, W = Warmth, M = Maceration, PU = Pre-ulcerative lesion F = Fissure, S = Swelling, D = Dryness Assessment Right: Left: Other Deformity: No No Prior Foot Ulcer: No No Prior Amputation: No No Charcot Joint: No No Ambulatory Status: Gait: Electronic Signature(s) Signed: 01/31/2023 4:45:25 PM By: Midge Aver  MSN RN CNS WTA Entered By: Midge Aver on 01/31/2023 14:42:30 -------------------------------------------------------------------------------- Nutrition Risk Screening Details Patient Name: Date of Service: [name]. 01/31/2023 2:00 PM Medical Record Number: [id number] Patient Account Number: [id number] Date of Birth/Sex: Treating RN: April 28, 1951 (72 y.o. Male) Midge Aver Primary Care Tiyonna Sardinha: Kerby Nora Other Clinician: Referring Bertin Inabinet: Treating Ninel Abdella/Extender: [name], [name] in Treatment: 0 Height (in): 66 Weight (lbs): 250 Body Mass Index (BMI): 40.3 Nutrition Risk Screening Items IKER, HYLAND ([id number]) [phone] [id number] Page 4 of 4 Score Screening NUTRITION RISK SCREEN: I have an illness or condition that made me change the kind and/or amount of food I eat 0 No I eat fewer than two meals per day 0 No I eat few fruits and vegetables, or milk products 0 No I have three or more drinks of beer, liquor or wine almost every day 0 No I have tooth or mouth problems that make it hard for me to eat 0 No I don't always have enough money to buy the food I need 0 No I eat alone most of the time 0 No I take three or more different prescribed or over-the-counter drugs a day 1 Yes Without wanting to, I have lost or gained 10 pounds in the last six months 0 No I am not always physically able to shop, cook and/or feed myself 0 No Nutrition Protocols Good Risk Protocol 0 No interventions needed Moderate Risk Protocol High Risk Proctocol Risk Level: Good Risk Score: 1 Electronic Signature(s) Signed: 01/31/2023 4:45:25 PM By: Midge Aver MSN RN CNS WTA Entered By: Midge Aver on 01/31/2023 14:31:36

## 2023-02-02 DIAGNOSIS — E11621 Type 2 diabetes mellitus with foot ulcer: Secondary | ICD-10-CM | POA: Diagnosis not present

## 2023-02-07 ENCOUNTER — Encounter: Payer: PPO | Admitting: Physician Assistant

## 2023-02-07 DIAGNOSIS — L97512 Non-pressure chronic ulcer of other part of right foot with fat layer exposed: Secondary | ICD-10-CM | POA: Diagnosis not present

## 2023-02-07 DIAGNOSIS — L97412 Non-pressure chronic ulcer of right heel and midfoot with fat layer exposed: Secondary | ICD-10-CM | POA: Diagnosis not present

## 2023-02-07 DIAGNOSIS — E11621 Type 2 diabetes mellitus with foot ulcer: Secondary | ICD-10-CM | POA: Diagnosis not present

## 2023-02-08 ENCOUNTER — Telehealth: Payer: Self-pay | Admitting: Family Medicine

## 2023-02-08 NOTE — Telephone Encounter (Signed)
Spoke with [name].  He would like to move forward with getting a wheelchair instead of the scooter.  Patient will need order for electric wheelchair and well as office visit with note why patient can't use a scooter.  Does patient need to schedule appointment in order to do this?   He did state he already has an .  appointment scheduled on 02/21/23 for DM so that could also be done at that time..  Please advise.

## 2023-02-08 NOTE — Telephone Encounter (Signed)
Return patient call and got his voicemail.  Left message that I would try to reach him again this afternoon.

## 2023-02-08 NOTE — Telephone Encounter (Signed)
Patient called in and had questions regarding an order for a scooter. Thank you!

## 2023-02-08 NOTE — Progress Notes (Addendum)
AAREON, DRASS ([id number]) [id number] Page 1 of 7 Visit Report for 02/07/2023 Chief Complaint Document Details Patient Name: Date of Service: [name] [facility] H. 02/07/2023 3:15 PM Medical Record Number: [id number] Patient Account Number: [id number] Date of Birth/Sex: Treating RN: 1950/12/05 (72 y.o. Male) Redmond Pulling Primary Care Provider: Kerby Nora Other Clinician: Referring Provider: Treating Provider/Extender: [name], [name] in Treatment: 1 Information Obtained from: Patient Chief Complaint Right foot ulcers Electronic Signature(s) Signed: 02/07/2023 3:39:54 PM By: Allen Derry PA-C Entered By: Allen Derry on 02/07/2023 15:39:54 -------------------------------------------------------------------------------- HPI Details Patient Name: Date of Service: [name] [facility]. 02/07/2023 3:15 PM Medical Record Number: [id number] Patient Account Number: [id number] Date of Birth/Sex: Treating RN: 01/09/1951 (72 y.o. Male) Redmond Pulling Primary Care Provider: Kerby Nora Other Clinician: Referring Provider: Treating Provider/Extender: [name], [name] in Treatment: 1 History of Present Illness HPI Description: 01-31-2023 upon evaluation today patient appears to be doing poorly currently in regard to wounds on his right heel, right dorsal foot, and right lateral foot. Subsequently he does have known peripheral vascular disease and again this is something that he needs to I think Checked formally. This is what the majority of the conversation today hinged around and the patient voiced understanding as far as that is concerned. Fortunately I do not see any signs of active infection locally nor systemically which is great news. Patient does have a history of diabetes mellitus type 2, atrial fibrillation for which she is on long-term anticoagulant Therapy, hypertension, and coronary artery disease. Patient's hemoglobin A1c most recently  was on 11-17-2022 and was 7.2 and currently he is on Eliquis and Plavix. 02-07-2023 upon evaluation today patient appears to be doing well currently in regard to his wounds all things considered I feel like we are still maintaining that does not seem like it is any worse is also not significantly better. I discussed with the patient that I do believe he would benefit from the arterial evaluation we still need to get this done as quickly as possible and subsequently we did put in a follow-up call with the vascular office today with regard to this. Electronic Signature(s) Signed: 02/07/2023 5:25:21 PM By: Darlyn Chamber, [name] ([id number]) [id number] Page 2 of 7 Entered By: Allen Derry on 02/07/2023 17:25:21 -------------------------------------------------------------------------------- Physical Exam Details Patient Name: Date of Service: [name] 02/07/2023 3:15 PM Medical Record Number: [id number] Patient Account Number: [id number] Date of Birth/Sex: Treating RN: 12-07-1950 (72 y.o. Male) Redmond Pulling Primary Care Provider: Kerby Nora Other Clinician: Referring Provider: Treating Provider/Extender: [name], [name] in Treatment: 1 Constitutional Well-nourished and well-hydrated in no acute distress. Respiratory normal breathing without difficulty. Psychiatric this patient is able to make decisions and demonstrates good insight into disease process. Alert and Oriented x 3. pleasant and cooperative. Notes Upon inspection patient's wound bed actually showed signs of poor granulation. He has a lot of necrotic tissue still noted at this point and fortunately though I do not see any signs of infection unfortunately he still has not really had a healthy wound bed at all. I think we need to work on this fairly aggressively but at the same time really has not able to do that until we get the results back of the vascular studies which  have not even been done yet we are still working on the scheduling here. Electronic Signature(s) Signed: 02/07/2023 5:26:10 PM By: Allen Derry PA-C Entered By: Allen Derry on 02/07/2023 17:26:09 --------------------------------------------------------------------------------  Physician Orders Details Patient Name: Date of Service: [name] [facility] H. 02/07/2023 3:15 PM Medical Record Number: [id number] Patient Account Number: [id number] Date of Birth/Sex: Treating RN: 1951-05-15 (72 y.o. Male) Yevonne Pax Primary Care Provider: Kerby Nora Other Clinician: Referring Provider: Treating Provider/Extender: [name], [name] in Treatment: 1 Verbal / Phone Orders: No Diagnosis Coding ICD-10 Coding Code Description E11.621 Type 2 diabetes mellitus with foot ulcer L97.512 Non-pressure chronic ulcer of other part of right foot with fat layer exposed I48.0 Paroxysmal atrial fibrillation [name], [name] ([id number]) [id number] Page 3 of 7 Z79.01 Long term (current) use of anticoagulants I10 Essential (primary) hypertension I25.10 Atherosclerotic heart disease of native coronary artery without angina pectoris Follow-up Appointments Return Appointment in 1 week. Bathing/ [employer] wounds with antibacterial soap and water. Anesthetic (Use 'Patient Medications' Section for Anesthetic Order Entry) Lidocaine applied to wound bed Wound Treatment Wound #1 - Calcaneus Wound Laterality: Right Cleanser: Byram Ancillary Kit - 15 Day Supply (Generic) 3 x Per Week/30 Days Discharge Instructions: Use supplies as instructed; Kit contains: (15) Saline Bullets; (15) 3x3 Gauze; 15 pr Gloves Cleanser: Soap and Water 3 x Per Week/30 Days Discharge Instructions: Gently cleanse wound with antibacterial soap, rinse and pat dry prior to dressing wounds Prim Dressing: IODOFLEX 0.9% Cadexomer Iodine Pad (Generic) 3 x Per Week/30 Days ary Discharge Instructions:  Apply Iodoflex to wound bed only as directed. Secondary Dressing: (BORDER) Zetuvit Plus SILICONE BORDER Dressing 4x4 (in/in) (Generic) 3 x Per Week/30 Days Discharge Instructions: Please do not put silicone bordered dressings under wraps. Use non-bordered dressing only. Wound #2 - Foot Wound Laterality: Dorsal, Right, Distal Cleanser: Byram Ancillary Kit - 15 Day Supply (Generic) 3 x Per Week/30 Days Discharge Instructions: Use supplies as instructed; Kit contains: (15) Saline Bullets; (15) 3x3 Gauze; 15 pr Gloves Cleanser: Soap and Water 3 x Per Week/30 Days Discharge Instructions: Gently cleanse wound with antibacterial soap, rinse and pat dry prior to dressing wounds Prim Dressing: IODOFLEX 0.9% Cadexomer Iodine Pad (Generic) 3 x Per Week/30 Days ary Discharge Instructions: Apply Iodoflex to wound bed only as directed. Secondary Dressing: (BORDER) Zetuvit Plus SILICONE BORDER Dressing 4x4 (in/in) (Generic) 3 x Per Week/30 Days Discharge Instructions: Please do not put silicone bordered dressings under wraps. Use non-bordered dressing only. Wound #3 - Foot Wound Laterality: Dorsal, Right, Proximal Cleanser: Soap and Water 3 x Per Week/30 Days Discharge Instructions: Gently cleanse wound with antibacterial soap, rinse and pat dry prior to dressing wounds Prim Dressing: IODOFLEX 0.9% Cadexomer Iodine Pad (Generic) 3 x Per Week/30 Days ary Discharge Instructions: Apply Iodoflex to wound bed only as directed. Secondary Dressing: (BORDER) Zetuvit Plus SILICONE BORDER Dressing 4x4 (in/in) (Generic) 3 x Per Week/30 Days Discharge Instructions: Please do not put silicone bordered dressings under wraps. Use non-bordered dressing only. Electronic Signature(s) Signed: 02/07/2023 6:26:43 PM By: Allen Derry PA-C Signed: 02/09/2023 4:13:25 PM By: Yevonne Pax RN Entered By: Yevonne Pax on 02/07/2023 15:54:43 -------------------------------------------------------------------------------- Problem List  Details Patient Name: Date of Service: [name] [facility] H. 02/07/2023 3:15 PM Medical Record Number: [id number] Patient Account Number: [id number] [name], [name] ([id number]) [id number] Page 4 of 7 Date of Birth/Sex: Treating RN: 28-Nov-1950 (72 y.o. Male) Redmond Pulling Primary Care Provider: Other Clinician: Kerby Nora Referring Provider: Treating Provider/Extender: [name], [name] in Treatment: 1 Active Problems ICD-10 Encounter Code Description Active Date MDM Diagnosis E11.621 Type 2 diabetes mellitus with foot ulcer 01/31/2023 No Yes L97.512 Non-pressure chronic ulcer of other  part of right foot with fat layer exposed 01/31/2023 No Yes I48.0 Paroxysmal atrial fibrillation 01/31/2023 No Yes Z79.01 Long term (current) use of anticoagulants 01/31/2023 No Yes I10 Essential (primary) hypertension 01/31/2023 No Yes I25.10 Atherosclerotic heart disease of native coronary artery without angina pectoris 01/31/2023 No Yes Inactive Problems Resolved Problems Electronic Signature(s) Signed: 02/07/2023 3:39:51 PM By: Allen Derry PA-C Entered By: Allen Derry on 02/07/2023 15:39:50 -------------------------------------------------------------------------------- Progress Note Details Patient Name: Date of Service: [name] [facility] H. 02/07/2023 3:15 PM Medical Record Number: [id number] Patient Account Number: [id number] Date of Birth/Sex: Treating RN: June 11, 1951 (72 y.o. Male) Redmond Pulling Primary Care Provider: Kerby Nora Other Clinician: Referring Provider: Treating Provider/Extender: [name], [name] in Treatment: 1 Subjective Chief Complaint Information obtained from Patient Right foot ulcers History of Present Illness (HPI) 01-31-2023 upon evaluation today patient appears to be doing poorly currently in regard to wounds on his right heel, right dorsal foot, and right lateral foot. [name], [name] ([id number])  [id number] Page 5 of 7 Subsequently he does have known peripheral vascular disease and again this is something that he needs to I think Checked formally. This is what the majority of the conversation today hinged around and the patient voiced understanding as far as that is concerned. Fortunately I do not see any signs of active infection locally nor systemically which is great news. Patient does have a history of diabetes mellitus type 2, atrial fibrillation for which she is on long-term anticoagulant Therapy, hypertension, and coronary artery disease. Patient's hemoglobin A1c most recently was on 11-17-2022 and was 7.2 and currently he is on Eliquis and Plavix. 02-07-2023 upon evaluation today patient appears to be doing well currently in regard to his wounds all things considered I feel like we are still maintaining that does not seem like it is any worse is also not significantly better. I discussed with the patient that I do believe he would benefit from the arterial evaluation we still need to get this done as quickly as possible and subsequently we did put in a follow-up call with the vascular office today with regard to this. Objective Constitutional Well-nourished and well-hydrated in no acute distress. Vitals Time Taken: 3:29 PM, Height: 66 in, Weight: 250 lbs, BMI: 40.3, Temperature: 98.1 F, Pulse: 67 bpm, Respiratory Rate: 18 breaths/min, Blood Pressure: 117/64 mmHg. Respiratory normal breathing without difficulty. Psychiatric this patient is able to make decisions and demonstrates good insight into disease process. Alert and Oriented x 3. pleasant and cooperative. General Notes: Upon inspection patient's wound bed actually showed signs of poor granulation. He has a lot of necrotic tissue still noted at this point and fortunately though I do not see any signs of infection unfortunately he still has not really had a healthy wound bed at all. I think we need to  work on this fairly aggressively but at the same time really has not able to do that until we get the results back of the vascular studies which have not even been done yet we are still working on the scheduling here. Integumentary (Hair, Skin) Wound #1 status is Open. Original cause of wound was Gradually Appeared. The date acquired was: 12/10/2022. The wound has been in treatment 1 weeks. The wound is located on the Right Calcaneus. The wound measures 3cm length x 1.4cm width x 0.1cm depth; 3.299cm^2 area and 0.33cm^3 volume. There is Fat Layer (Subcutaneous Tissue) exposed. There is no tunneling or undermining noted. There is a medium amount of serosanguineous drainage  noted. There is medium (34-66%) red granulation within the wound bed. There is no necrotic tissue within the wound bed. Wound #2 status is Open. Original cause of wound was Gradually Appeared. The date acquired was: 12/10/2022. The wound has been in treatment 1 weeks. The wound is located on the Right,Distal,Dorsal Foot. The wound measures 1cm length x 1cm width x 0.1cm depth; 0.785cm^2 area and 0.079cm^3 volume. There is Fat Layer (Subcutaneous Tissue) exposed. There is no tunneling or undermining noted. There is a medium amount of serosanguineous drainage noted. There is no granulation within the wound bed. There is no necrotic tissue within the wound bed. Wound #3 status is Open. Original cause of wound was Gradually Appeared. The date acquired was: 12/10/2022. The wound has been in treatment 1 weeks. The wound is located on the Right,Proximal,Dorsal Foot. The wound measures 1.5cm length x 2cm width x 0.1cm depth; 2.356cm^2 area and 0.236cm^3 volume. There is Fat Layer (Subcutaneous Tissue) exposed. There is no tunneling or undermining noted. There is a medium amount of serosanguineous drainage noted. There is no granulation within the wound bed. There is a large (67-100%) amount of necrotic tissue within the wound bed including  Adherent Slough. Assessment Active Problems ICD-10 Type 2 diabetes mellitus with foot ulcer Non-pressure chronic ulcer of other part of right foot with fat layer exposed Paroxysmal atrial fibrillation Long term (current) use of anticoagulants Essential (primary) hypertension Atherosclerotic heart disease of native coronary artery without angina pectoris Plan Follow-up Appointments: Return Appointment in 1 week. Bathing/ Shower/ Hygiene: Wash wounds with antibacterial soap and water. Anesthetic (Use 'Patient Medications' Section for Anesthetic Order Entry): Lidocaine applied to wound bed WOUND #1: - Calcaneus Wound Laterality: Right Cleanser: Byram Ancillary Kit - 15 Day Supply (Generic) 3 x Per Week/30 Days Discharge Instructions: Use supplies as instructed; Kit contains: (15) Saline Bullets; (15) 3x3 Gauze; 15 pr Gloves Cleanser: Soap and Water 3 x Per Week/30 Days [name], [name] ([id number]) [id number] Page 6 of 7 Discharge Instructions: Gently cleanse wound with antibacterial soap, rinse and pat dry prior to dressing wounds Prim Dressing: IODOFLEX 0.9% Cadexomer Iodine Pad (Generic) 3 x Per Week/30 Days ary Discharge Instructions: Apply Iodoflex to wound bed only as directed. Secondary Dressing: (BORDER) Zetuvit Plus SILICONE BORDER Dressing 4x4 (in/in) (Generic) 3 x Per Week/30 Days Discharge Instructions: Please do not put silicone bordered dressings under wraps. Use non-bordered dressing only. WOUND #2: - Foot Wound Laterality: Dorsal, Right, Distal Cleanser: Byram Ancillary Kit - 15 Day Supply (Generic) 3 x Per Week/30 Days Discharge Instructions: Use supplies as instructed; Kit contains: (15) Saline Bullets; (15) 3x3 Gauze; 15 pr Gloves Cleanser: Soap and Water 3 x Per Week/30 Days Discharge Instructions: Gently cleanse wound with antibacterial soap, rinse and pat dry prior to dressing wounds Prim Dressing: IODOFLEX 0.9% Cadexomer Iodine Pad  (Generic) 3 x Per Week/30 Days ary Discharge Instructions: Apply Iodoflex to wound bed only as directed. Secondary Dressing: (BORDER) Zetuvit Plus SILICONE BORDER Dressing 4x4 (in/in) (Generic) 3 x Per Week/30 Days Discharge Instructions: Please do not put silicone bordered dressings under wraps. Use non-bordered dressing only. WOUND #3: - Foot Wound Laterality: Dorsal, Right, Proximal Cleanser: Soap and Water 3 x Per Week/30 Days Discharge Instructions: Gently cleanse wound with antibacterial soap, rinse and pat dry prior to dressing wounds Prim Dressing: IODOFLEX 0.9% Cadexomer Iodine Pad (Generic) 3 x Per Week/30 Days ary Discharge Instructions: Apply Iodoflex to wound bed only as directed. Secondary Dressing: (BORDER) Zetuvit Plus SILICONE BORDER Dressing 4x4 (in/in) (  Generic) 3 x Per Week/30 Days Discharge Instructions: Please do not put silicone bordered dressings under wraps. Use non-bordered dressing only. 1. We did contact the vascular office to follow-up on scheduling the patient hopefully will get the arterial studies with ABI and TBI scheduled as quickly as possible. 2. I would recommend as well in the meantime we continue with Iodoflex. 3. Will continue with the bordered foam dressings to cover. We will see patient back for reevaluation in 1 week here in the clinic. If anything worsens or changes patient will contact our office for additional recommendations. Electronic Signature(s) Signed: 02/07/2023 5:26:30 PM By: Allen Derry PA-C Entered By: Allen Derry on 02/07/2023 17:26:29 -------------------------------------------------------------------------------- SuperBill Details Patient Name: Date of Service: [name] HN H. 02/07/2023 Medical Record Number: [id number] Patient Account Number: [id number] Date of Birth/Sex: Treating RN: 1951/03/28 (72 y.o. Male) Yevonne Pax Primary Care Provider: Kerby Nora Other Clinician: Referring Provider: Treating Provider/Extender:  [name], [name] in Treatment: 1 Diagnosis Coding ICD-10 Codes Code Description E11.621 Type 2 diabetes mellitus with foot ulcer L97.512 Non-pressure chronic ulcer of other part of right foot with fat layer exposed I48.0 Paroxysmal atrial fibrillation Z79.01 Long term (current) use of anticoagulants I10 Essential (primary) hypertension I25.10 Atherosclerotic heart disease of native coronary artery without angina pectoris Facility Procedures : CPT4 Code: [id number] Description: 30865 - WOUND CARE VISIT-LEV 4 EST PT Modifier: Quantity: 1 Physician Procedures [name], [name] ([id number]): CPT4 Code Description [id number] [phone] - WC PHYS LEVEL 3 - EST PT ICD-10 Diagnosis Description E11.621 Type 2 diabetes mellitus with foot ulcer L97.512 Non-pressure chronic ulcer of other part of right foot with fat l I48.0  Paroxysmal atrial fibrillation Z79.01 Long term (current) use of anticoagulants [id number] Page 7 of 7: Quantity Modifier 1 ayer exposed Electronic Signature(s) Signed: 02/07/2023 5:28:40 PM By: Allen Derry PA-C Entered By: Allen Derry on 02/07/2023 17:28:39

## 2023-02-09 ENCOUNTER — Other Ambulatory Visit (INDEPENDENT_AMBULATORY_CARE_PROVIDER_SITE_OTHER): Payer: Self-pay | Admitting: Internal Medicine

## 2023-02-09 ENCOUNTER — Other Ambulatory Visit (INDEPENDENT_AMBULATORY_CARE_PROVIDER_SITE_OTHER): Payer: Self-pay | Admitting: Physician Assistant

## 2023-02-09 DIAGNOSIS — E13621 Other specified diabetes mellitus with foot ulcer: Secondary | ICD-10-CM

## 2023-02-09 DIAGNOSIS — L97512 Non-pressure chronic ulcer of other part of right foot with fat layer exposed: Secondary | ICD-10-CM

## 2023-02-09 DIAGNOSIS — L89620 Pressure ulcer of left heel, unstageable: Secondary | ICD-10-CM

## 2023-02-09 DIAGNOSIS — L89610 Pressure ulcer of right heel, unstageable: Secondary | ICD-10-CM

## 2023-02-09 NOTE — Telephone Encounter (Signed)
Per [employer] phone call, I think we can use back issues and difficulty ambulating, have trouble with mounting the scooters because they have to step up on a platform to get on the scooter.

## 2023-02-09 NOTE — Telephone Encounter (Signed)
Well, we need a reason for why he cannot use the scooter?  Typically I believe it is a dysfunction of core stability, not able to support himself upright. Does he have this or other issue that makes scooter not possible?

## 2023-02-10 NOTE — Telephone Encounter (Signed)
Per Dr. Ermalene Searing, we will do wheelchair assessment at upcoming appointment on 02/21/2023.  Patient notified of this via telephone.

## 2023-02-11 NOTE — Progress Notes (Signed)
[name], [name] ([id number]) [id number] Page 1 of 13 Visit Report for 02/07/2023 Arrival Information Details Patient Name: Date of Service: [name] [facility] H. 02/07/2023 3:15 PM Medical Record Number: [id number] Patient Account Number: [id number] Date of Birth/Sex: Treating RN: 03-Jun-1951 (72 y.o. Male) [name] Primary Care Jazleen Robeck: [name] Other Clinician: Referring Anyra Kaufman: Treating [name]/Extender: [name], [name] in Treatment: 1 Visit Information History Since Last Visit Added or deleted any medications: No Patient Arrived: Ambulatory Any new allergies or adverse reactions: No Arrival Time: 15:24 Had a fall or experienced change in No Accompanied By: self activities of daily living that may affect Transfer Assistance: None risk of falls: Patient Identification Verified: Yes Signs or symptoms of abuse/neglect since last visito No Secondary Verification Process Completed: Yes Hospitalized since last visit: No Patient Requires Transmission-Based Precautions: No Implantable device outside of the clinic excluding No Patient Has Alerts: Yes cellular tissue based products placed in the center Patient Alerts: Patient on Blood Thinner since last visit: Diabetes type 2 Has Dressing in Place as Prescribed: Yes Eliquis/Plavix Pain Present Now: No Bilateral PAD 01/31/23 Electronic Signature(s) Signed: 02/09/2023 4:13:25 PM By: William Pax RN Entered By: [name] on 02/07/2023 15:29:10 -------------------------------------------------------------------------------- Clinic Level of Care Assessment Details Patient Name: Date of Service: [name] [facility] H. 02/07/2023 3:15 PM Medical Record Number: [id number] Patient Account Number: [id number] Date of Birth/Sex: Treating RN: 1951/07/06 (72 y.o. Male) [name] Primary Care Eliyahu Bille: [name] Other Clinician: Referring [name]: Treating Casson Catena/Extender: [name],  [name] in Treatment: 1 Clinic Level of Care Assessment Items TOOL 4 Quantity Score X- 1 0 Use when only an EandM is performed on FOLLOW-UP visit ASSESSMENTS - Nursing Assessment / Reassessment X- 1 10 Reassessment of Co-morbidities (includes updates in patient status) X- 1 5 Reassessment of Adherence to Treatment Plan [name], [name] ([id number]) [id number] Page 2 of 13 ASSESSMENTS - Wound and Skin A ssessment / Reassessment []  - Simple Wound Assessment / Reassessment - one wound 0 X- 3 5 Complex Wound Assessment / Reassessment - multiple wounds []  - 0 Dermatologic / Skin Assessment (not related to wound area) ASSESSMENTS - Focused Assessment []  - 0 Circumferential Edema Measurements - multi extremities []  - 0 Nutritional Assessment / Counseling / Intervention []  - 0 Lower Extremity Assessment (monofilament, tuning fork, pulses) []  - 0 Peripheral Arterial Disease Assessment (using hand held doppler) ASSESSMENTS - Ostomy and/or Continence Assessment and Care []  - 0 Incontinence Assessment and Management []  - 0 Ostomy Care Assessment and Management (repouching, etc.) PROCESS - Coordination of Care X - Simple Patient / Family Education for ongoing care 1 15 []  - 0 Complex (extensive) Patient / Family Education for ongoing care []  - 0 Staff obtains Chiropractor, Records, T Results / Process Orders est []  - 0 Staff telephones HHA, Nursing Homes / Clarify orders / etc []  - 0 Routine Transfer to another Facility (non-emergent condition) []  - 0 Routine Hospital Admission (non-emergent condition) []  - 0 New Admissions / Manufacturing engineer / Ordering NPWT Apligraf, etc. , []  - 0 Emergency Hospital Admission (emergent condition) X- 1 10 Simple Discharge Coordination []  - 0 Complex (extensive) Discharge Coordination PROCESS - Special Needs []  - 0 Pediatric / Minor Patient Management []  - 0 Isolation Patient Management []  - 0 Hearing /  Language / Visual special needs []  - 0 Assessment of Community assistance (transportation, D/C planning, etc.) []  - 0 Additional assistance / Altered mentation []  - 0 Support Surface(s) Assessment (bed, cushion,  seat, etc.) INTERVENTIONS - Wound Cleansing / Measurement []  - 0 Simple Wound Cleansing - one wound X- 3 5 Complex Wound Cleansing - multiple wounds X- 1 5 Wound Imaging (photographs - any number of wounds) []  - 0 Wound Tracing (instead of photographs) []  - 0 Simple Wound Measurement - one wound X- 3 5 Complex Wound Measurement - multiple wounds INTERVENTIONS - Wound Dressings X - Small Wound Dressing one or multiple wounds 3 10 []  - 0 Medium Wound Dressing one or multiple wounds []  - 0 Large Wound Dressing one or multiple wounds []  - 0 Application of Medications - topical []  - 0 Application of Medications - injection INTERVENTIONS - Miscellaneous []  - 0 External ear exam [name], [name] ([id number]) [id number] Page 3 of 13 []  - 0 Specimen Collection (cultures, biopsies, blood, body fluids, etc.) []  - 0 Specimen(s) / Culture(s) sent or taken to Lab for analysis []  - 0 Patient Transfer (multiple staff / Michiel Sites Lift / Similar devices) []  - 0 Simple Staple / Suture removal (25 or less) []  - 0 Complex Staple / Suture removal (26 or more) []  - 0 Hypo / Hyperglycemic Management (close monitor of Blood Glucose) []  - 0 Ankle / Brachial Index (ABI) - do not check if billed separately X- 1 5 Vital Signs Has the patient been seen at the hospital within the last three years: Yes Total Score: 125 Level Of Care: New/Established - Level 4 Electronic Signature(s) Signed: 02/09/2023 4:13:25 PM By: William Pax RN Entered By: [name] on 02/07/2023 15:55:26 -------------------------------------------------------------------------------- Encounter Discharge Information Details Patient Name: Date of Service: [name] [facility] H. 02/07/2023 3:15  PM Medical Record Number: [id number] Patient Account Number: [id number] Date of Birth/Sex: Treating RN: December 05, 1950 (72 y.o. Male) [name] Primary Care Melondy Blanchard: [name] Other Clinician: Referring Mouhamad Teed: Treating Kerith Sherley/Extender: [name], [name] in Treatment: 1 Encounter Discharge Information Items Discharge Condition: Stable Ambulatory Status: Ambulatory Discharge Destination: Home Transportation: Private Auto Accompanied By: self Schedule Follow-up Appointment: Yes Clinical Summary of Care: Electronic Signature(s) Signed: 02/09/2023 4:13:25 PM By: William Pax RN Entered By: [name] on 02/07/2023 15:56:14 -------------------------------------------------------------------------------- Lower Extremity Assessment Details Patient Name: Date of Service: [name] [facility] H. 02/07/2023 3:15 PM [name], [name] ([id number]) [id number] Page 4 of 13 Medical Record Number: [id number] Patient Account Number: [id number] Date of Birth/Sex: Treating RN: 04-Mar-1951 (72 y.o. Male) [name] Primary Care Cherlynn Popiel: [name] Other Clinician: Referring Dejia Ebron: Treating Tayshun Gappa/Extender: [name], [name] in Treatment: 1 Edema Assessment Assessed: [Left: No] [Right: No] Edema: [Left: Ye] [Right: s] Calf Left: Right: Point of Measurement: 35 cm From Medial Instep 40 cm Ankle Left: Right: Point of Measurement: 15 cm From Medial Instep 24 cm Knee To Floor Left: Right: From Medial Instep 40 cm Vascular Assessment Pulses: Dorsalis Pedis Palpable: [Right:Yes] Electronic Signature(s) Signed: 02/09/2023 4:13:25 PM By: William Pax RN Entered By: [name] on 02/07/2023 15:36:44 -------------------------------------------------------------------------------- Multi Wound Chart Details Patient Name: Date of Service: [name] [facility] H. 02/07/2023 3:15 PM Medical Record Number: [id number] Patient Account Number:  [id number] Date of Birth/Sex: Treating RN: 1951/08/05 (72 y.o. Male) [name] Primary Care Angelo Prindle: [name] Other Clinician: Referring Destinee Taber: Treating Ellina Sivertsen/Extender: [name], [name] in Treatment: 1 Vital Signs Height(in): 66 Pulse(bpm): 67 Weight(lbs): 250 Blood Pressure(mmHg): 117/64 Body Mass Index(BMI): 40.3 Temperature(F): 98.1 Respiratory Rate(breaths/min): 18 [1:Photos:] [3:[id number] Page 5 of 13] Right Calcaneus Right, Distal, Dorsal Foot Right, Proximal, Dorsal Foot Wound Location: Gradually Appeared Gradually  Appeared Gradually Appeared Wounding Event: Diabetic Wound/Ulcer of the Lower Diabetic Wound/Ulcer of the Lower Diabetic Wound/Ulcer of the Lower Primary Etiology: Extremity Extremity Extremity Congestive Heart Failure, Coronary Congestive Heart Failure, Coronary Congestive Heart Failure, Coronary Comorbid History: Artery Disease, Hypertension, Type II Artery Disease, Hypertension, Type II Artery Disease, Hypertension, Type II Diabetes, Neuropathy Diabetes, Neuropathy Diabetes, Neuropathy 12/10/2022 12/10/2022 12/10/2022 Date Acquired: 1 1 1  [name] of Treatment: Open Open Open Wound Status: No No No Wound Recurrence: Yes No No Clustered Wound: 3x1.4x0.1 1x1x0.1 1.5x2x0.1 Measurements L x W x D (cm) 3.299 0.785 2.356 A (cm) : rea 0.33 0.079 0.236 Volume (cm) : 22.80% 9.10% 6.20% % Reduction in A rea: 22.70% 8.10% 6.00% % Reduction in Volume: Grade 1 Grade 1 Grade 1 Classification: Medium Medium Medium Exudate A mount: Serosanguineous Serosanguineous Serosanguineous Exudate Type: red, brown red, brown red, brown Exudate Color: Medium (34-66%) None Present (0%) None Present (0%) Granulation A mount: Red N/A N/A Granulation Quality: None Present (0%) None Present (0%) Large (67-100%) Necrotic A mount: Fat Layer (Subcutaneous Tissue): Yes Fat Layer (Subcutaneous Tissue): Yes Fat Layer  (Subcutaneous Tissue): Yes Exposed Structures: Fascia: No Fascia: No Fascia: No Tendon: No Tendon: No Tendon: No Muscle: No Muscle: No Muscle: No Joint: No Joint: No Joint: No Bone: No Bone: No Bone: No None Small (1-33%) Small (1-33%) Epithelialization: Treatment Notes Electronic Signature(s) Signed: 02/09/2023 4:13:25 PM By: William Pax RN Entered By: [name] on 02/07/2023 15:36:48 -------------------------------------------------------------------------------- Multi-Disciplinary Care Plan Details Patient Name: Date of Service: [name] HN H. 02/07/2023 3:15 PM Medical Record Number: [id number] Patient Account Number: [id number] Date of Birth/Sex: Treating RN: 15-Mar-1951 (72 y.o. Male) [name] Primary Care Lydie Stammen: [name] Other Clinician: Referring Katrena Stehlin: Treating Taren Toops/Extender: [name], [name] in Treatment: 1 Active Inactive Necrotic Tissue Nursing Diagnoses: Impaired tissue integrity related to necrotic/devitalized tissue Knowledge deficit related to management of necrotic/devitalized tissue Goals: Necrotic/devitalized tissue will be minimized in the wound bed Date Initiated: 02/01/2023 Target Resolution Date: 03/04/2023 Goal Status: Active Patient/caregiver will verbalize understanding of reason and process for debridement of necrotic tissue [name], [name] ([id number]) [id number] Page 6 of 13 Date Initiated: 02/01/2023 Target Resolution Date: 03/04/2023 Goal Status: Active Interventions: Assess patient pain level pre-, during and post procedure and prior to discharge Provide education on necrotic tissue and debridement process Treatment Activities: Apply topical anesthetic as ordered : 01/31/2023 Notes: Orientation to the Wound Care Program Nursing Diagnoses: Knowledge deficit related to the wound healing center program Goals: Patient/caregiver will verbalize understanding of the Wound Healing  Center Program Date Initiated: 02/01/2023 Target Resolution Date: 02/16/2023 Goal Status: Active Interventions: Provide education on orientation to the wound center Notes: Pressure Nursing Diagnoses: Knowledge deficit related to causes and risk factors for pressure ulcer development Knowledge deficit related to management of pressures ulcers Potential for impaired tissue integrity related to pressure, friction, moisture, and shear Goals: Patient will remain free from development of additional pressure ulcers Date Initiated: 02/01/2023 Target Resolution Date: 03/04/2023 Goal Status: Active Patient/caregiver will verbalize risk factors for pressure ulcer development Date Initiated: 02/01/2023 Target Resolution Date: 03/04/2023 Goal Status: Active Patient/caregiver will verbalize understanding of pressure ulcer management Date Initiated: 02/01/2023 Target Resolution Date: 03/04/2023 Goal Status: Active Interventions: Assess potential for pressure ulcer upon admission and as needed Provide education on pressure ulcers Treatment Activities: T ordered outside of clinic : 01/31/2023 est Notes: Wound/Skin Impairment Nursing Diagnoses: Impaired tissue integrity Knowledge deficit related to ulceration/compromised skin integrity Goals: Patient/caregiver will verbalize understanding of  skin care regimen Date Initiated: 02/01/2023 Target Resolution Date: 03/04/2023 Goal Status: Active Ulcer/skin breakdown will have a volume reduction of 30% by week 4 Date Initiated: 02/01/2023 Target Resolution Date: 03/04/2023 Goal Status: Active Ulcer/skin breakdown will have a volume reduction of 50% by week 8 Date Initiated: 02/01/2023 Target Resolution Date: 04/04/2023 Goal Status: Active Ulcer/skin breakdown will have a volume reduction of 80% by week 12 Date Initiated: 02/01/2023 Target Resolution Date: 05/05/2023 Goal Status: Active Interventions: Assess patient/caregiver ability to obtain necessary supplies Assess  patient/caregiver ability to perform ulcer/skin care regimen upon admission and as needed Assess ulceration(s) every visit [name], [name] ([id number]) [id number] Page 7 of 13 Provide education on ulcer and skin care Treatment Activities: Referred to DME Kamari Buch for dressing supplies : 01/31/2023 Skin care regimen initiated : 01/31/2023 Notes: Electronic Signature(s) Signed: 02/09/2023 4:13:25 PM By: William Pax RN Entered By: [name] on 02/07/2023 15:36:58 -------------------------------------------------------------------------------- Pain Assessment Details Patient Name: Date of Service: [name] HN H. 02/07/2023 3:15 PM Medical Record Number: [id number] Patient Account Number: [id number] Date of Birth/Sex: Treating RN: October 28, 1950 (72 y.o. Male) [name] Primary Care Cyra Spader: [name] Other Clinician: Referring Monzerat Handler: Treating Garon Melander/Extender: [name], [name] in Treatment: 1 Active Problems Location of Pain Severity and Description of Pain Patient Has Paino No Site Locations Pain Management and Medication Current Pain Management: Electronic Signature(s) Signed: 02/09/2023 4:13:25 PM By: William Pax RN Entered By: [name] on 02/07/2023 15:30:30 [name] ([id number]) [id number] Page 8 of 13 -------------------------------------------------------------------------------- Patient/Caregiver Education Details Patient Name: Date of Service: [name] 6/11/2024andnbsp3:15 PM Medical Record Number: [id number] Patient Account Number: [id number] Date of Birth/Gender: Treating RN: 02/17/1951 (72 y.o. Male) [name] Primary Care Physician: [name] Other Clinician: Referring Physician: Treating Physician/Extender: [name], [name] in Treatment: 1 Education Assessment Education Provided To: Patient Education Topics Provided Wound/Skin Impairment: Handouts:  Caring for Your Ulcer Methods: Explain/Verbal Responses: State content correctly Electronic Signature(s) Signed: 02/09/2023 4:13:25 PM By: William Pax RN Entered By: [name] on 02/07/2023 15:37:11 -------------------------------------------------------------------------------- Wound Assessment Details Patient Name: Date of Service: [name] HN H. 02/07/2023 3:15 PM Medical Record Number: [id number] Patient Account Number: [id number] Date of Birth/Sex: Treating RN: April 17, 1951 (72 y.o. Male) [name] Primary Care Asah Lamay: [name] Other Clinician: Referring Yanelli Zapanta: Treating Dunbar Buras/Extender: [name], [name] in Treatment: 1 Wound Status Wound Number: 1 Primary Diabetic Wound/Ulcer of the Lower Extremity Etiology: Wound Location: Right Calcaneus Wound Open Wounding Event: Gradually Appeared Status: Date Acquired: 12/10/2022 Comorbid Congestive Heart Failure, Coronary Artery Disease, [name] Of Treatment: 1 History: Hypertension, Type II Diabetes, Neuropathy Clustered Wound: Yes Photos [name], [name] ([id number]) [id number] Page 9 of 13 Wound Measurements Length: (cm) 3 Width: (cm) 1.4 Depth: (cm) 0.1 Area: (cm) 3.299 Volume: (cm) 0.33 % Reduction in Area: 22.8% % Reduction in Volume: 22.7% Epithelialization: None Tunneling: No Undermining: No Wound Description Classification: Grade 1 Exudate Amount: Medium Exudate Type: Serosanguineous Exudate Color: red, brown Foul Odor After Cleansing: No Slough/Fibrino No Wound Bed Granulation Amount: Medium (34-66%) Exposed Structure Granulation Quality: Red Fascia Exposed: No Necrotic Amount: None Present (0%) Fat Layer (Subcutaneous Tissue) Exposed: Yes Tendon Exposed: No Muscle Exposed: No Joint Exposed: No Bone Exposed: No Treatment Notes Wound #1 (Calcaneus) Wound Laterality: Right Cleanser Byram Ancillary Kit - 15 Day Supply Discharge Instruction: Use supplies  as instructed; Kit contains: (15) Saline Bullets; (15) 3x3 Gauze; 15 pr Gloves Soap and Water Discharge Instruction: Gently cleanse wound  with antibacterial soap, rinse and pat dry prior to dressing wounds Peri-Wound Care Topical Primary Dressing IODOFLEX 0.9% Cadexomer Iodine Pad Discharge Instruction: Apply Iodoflex to wound bed only as directed. Secondary Dressing (BORDER) Zetuvit Plus SILICONE BORDER Dressing 4x4 (in/in) Discharge Instruction: Please do not put silicone bordered dressings under wraps. Use non-bordered dressing only. Secured With Compression Wrap Compression Stockings Add-Ons Electronic Signature(s) Signed: 02/09/2023 4:13:25 PM By: William Pax RN Entered By: [name] on 02/07/2023 15:34:59 [name] ([id number]) [id number] Page 10 of 13 -------------------------------------------------------------------------------- Wound Assessment Details Patient Name: Date of Service: [name] [facility] H. 02/07/2023 3:15 PM Medical Record Number: [id number] Patient Account Number: [id number] Date of Birth/Sex: Treating RN: 1950-08-30 (72 y.o. Male) [name] Primary Care Aftan Vint: [name] Other Clinician: Referring Jolan Upchurch: Treating Stayce Delancy/Extender: [name], [name] in Treatment: 1 Wound Status Wound Number: 2 Primary Diabetic Wound/Ulcer of the Lower Extremity Etiology: Wound Location: Right, Distal, Dorsal Foot Wound Open Wounding Event: Gradually Appeared Status: Date Acquired: 12/10/2022 Comorbid Congestive Heart Failure, Coronary Artery Disease, [name] Of Treatment: 1 History: Hypertension, Type II Diabetes, Neuropathy Clustered Wound: No Photos Wound Measurements Length: (cm) 1 Width: (cm) 1 Depth: (cm) 0.1 Area: (cm) 0.785 Volume: (cm) 0.079 % Reduction in Area: 9.1% % Reduction in Volume: 8.1% Epithelialization: Small (1-33%) Tunneling: No Undermining: No Wound Description Classification: Grade  1 Exudate Amount: Medium Exudate Type: Serosanguineous Exudate Color: red, brown Foul Odor After Cleansing: No Slough/Fibrino No Wound Bed Granulation Amount: None Present (0%) Exposed Structure Necrotic Amount: None Present (0%) Fascia Exposed: No Fat Layer (Subcutaneous Tissue) Exposed: Yes Tendon Exposed: No Muscle Exposed: No Joint Exposed: No Bone Exposed: No Treatment Notes Wound #2 (Foot) Wound Laterality: Dorsal, Right, Distal Cleanser Byram Ancillary Kit - 15 Day Supply Discharge Instruction: Use supplies as instructed; Kit contains: (15) Saline Bullets; (15) 3x3 Gauze; 15 pr [address] [name], [name] ([id number]) [id number] Page 11 of 13 Discharge Instruction: Gently cleanse wound with antibacterial soap, rinse and pat dry prior to dressing wounds Peri-Wound Care Topical Primary Dressing IODOFLEX 0.9% Cadexomer Iodine Pad Discharge Instruction: Apply Iodoflex to wound bed only as directed. Secondary Dressing (BORDER) Zetuvit Plus SILICONE BORDER Dressing 4x4 (in/in) Discharge Instruction: Please do not put silicone bordered dressings under wraps. Use non-bordered dressing only. Secured With Compression Wrap Compression Stockings Facilities manager) Signed: 02/09/2023 4:13:25 PM By: William Pax RN Entered By: [name] on 02/07/2023 15:36:00 -------------------------------------------------------------------------------- Wound Assessment Details Patient Name: Date of Service: [name] HN H. 02/07/2023 3:15 PM Medical Record Number: [id number] Patient Account Number: [id number] Date of Birth/Sex: Treating RN: 02-13-1951 (72 y.o. Male) [name] Primary Care Andreika Vandagriff: [name] Other Clinician: Referring Chrishun Scheer: Treating Kerron Sedano/Extender: [name], [name] in Treatment: 1 Wound Status Wound Number: 3 Primary Diabetic Wound/Ulcer of the Lower Extremity Etiology: Wound Location: Right,  Proximal, Dorsal Foot Wound Open Wounding Event: Gradually Appeared Status: Date Acquired: 12/10/2022 Comorbid Congestive Heart Failure, Coronary Artery Disease, [name] Of Treatment: 1 History: Hypertension, Type II Diabetes, Neuropathy Clustered Wound: No Photos Wound Measurements Length: (cm) 1.5 Width: (cm) 2 Depth: (cm) 0.1 Area: (cm) 2.356 [name], [name] ([id number]) Volume: (cm) 0.236 % Reduction in Area: 6.2% % Reduction in Volume: 6% Epithelialization: Small (1-33%) Tunneling: No [id number] Page 12 of 13 Undermining: No Wound Description Classification: Grade 1 Exudate Amount: Medium Exudate Type: Serosanguineous Exudate Color: red, brown Foul Odor After Cleansing: No Slough/Fibrino Yes Wound Bed Granulation Amount: None Present (0%) Exposed Structure Necrotic Amount:  Large (67-100%) Fascia Exposed: No Necrotic Quality: Adherent Slough Fat Layer (Subcutaneous Tissue) Exposed: Yes Tendon Exposed: No Muscle Exposed: No Joint Exposed: No Bone Exposed: No Treatment Notes Wound #3 (Foot) Wound Laterality: Dorsal, Right, Proximal Cleanser Soap and Water Discharge Instruction: Gently cleanse wound with antibacterial soap, rinse and pat dry prior to dressing wounds Peri-Wound Care Topical Primary Dressing IODOFLEX 0.9% Cadexomer Iodine Pad Discharge Instruction: Apply Iodoflex to wound bed only as directed. Secondary Dressing (BORDER) Zetuvit Plus SILICONE BORDER Dressing 4x4 (in/in) Discharge Instruction: Please do not put silicone bordered dressings under wraps. Use non-bordered dressing only. Secured With Compression Wrap Compression Stockings Facilities manager) Signed: 02/09/2023 4:13:25 PM By: William Pax RN Entered By: [name] on 02/07/2023 15:36:24 -------------------------------------------------------------------------------- Vitals Details Patient Name: Date of Service: [name] HN H. 02/07/2023 3:15  PM Medical Record Number: [id number] Patient Account Number: [id number] Date of Birth/Sex: Treating RN: 1951/03/02 (72 y.o. Male) [name] Primary Care Rhondalyn Clingan: [name] Other Clinician: Referring Dolphus Linch: Treating Cherelle Midkiff/Extender: [name], [name] in Treatment: 1 Vital Signs Time Taken: 15:29 Temperature (F): 98.1 Height (in): 66 Pulse (bpm): [address] H ([id number]) [id number] Page 13 of 13 Weight (lbs): 250 Respiratory Rate (breaths/min): 18 Body Mass Index (BMI): 40.3 Blood Pressure (mmHg): 117/64 Reference Range: 80 - 120 mg / dl Electronic Signature(s) Signed: 02/09/2023 4:13:25 PM By: William Pax RN Entered By: [name] on 02/07/2023 15:30:24

## 2023-02-13 ENCOUNTER — Other Ambulatory Visit: Payer: Self-pay | Admitting: *Deleted

## 2023-02-13 ENCOUNTER — Other Ambulatory Visit: Payer: Self-pay | Admitting: Family Medicine

## 2023-02-13 MED ORDER — APIXABAN 5 MG PO TABS: 5.0000 mg | ORAL_TABLET | Freq: Two times a day (BID) | ORAL | 3 refills | Status: DC

## 2023-02-13 NOTE — Telephone Encounter (Signed)
Last office visit 01/10/23 for mobility assessment.  Last refilled Norco 11/17/2022 for #60 with no refills x 3.  Lamisil 01/10/2023 for #30 with no refills.  Next appt: 02/21/2023.  UDS/Contract 05/12/2022

## 2023-02-13 NOTE — Telephone Encounter (Signed)
Application completed, faxed, and filed.  

## 2023-02-14 ENCOUNTER — Institutional Professional Consult (permissible substitution) (HOSPITAL_BASED_OUTPATIENT_CLINIC_OR_DEPARTMENT_OTHER): Payer: PPO | Admitting: Pulmonary Disease

## 2023-02-14 ENCOUNTER — Encounter: Payer: PPO | Admitting: Physician Assistant

## 2023-02-14 DIAGNOSIS — E11621 Type 2 diabetes mellitus with foot ulcer: Secondary | ICD-10-CM | POA: Diagnosis not present

## 2023-02-14 DIAGNOSIS — L97412 Non-pressure chronic ulcer of right heel and midfoot with fat layer exposed: Secondary | ICD-10-CM | POA: Diagnosis not present

## 2023-02-14 DIAGNOSIS — L97512 Non-pressure chronic ulcer of other part of right foot with fat layer exposed: Secondary | ICD-10-CM | POA: Diagnosis not present

## 2023-02-14 NOTE — Progress Notes (Addendum)
JAWAD, WIACEK ([id number]) [id number] Page 1 of 7 Visit Report for 02/14/2023 Chief Complaint Document Details Patient Name: Date of Service: [name] [facility] H. 02/14/2023 2:30 PM Medical Record Number: [id number] Patient Account Number: [id number] Date of Birth/Sex: Treating RN: 01-26-1951 (72 y.o. [name]) Yevonne Pax Primary Care Provider: Kerby Nora Other Clinician: Referring Provider: Treating Provider/Extender: [name], [name] in Treatment: 2 Information Obtained from: Patient Chief Complaint Right foot ulcers Electronic Signature(s) Signed: 02/14/2023 2:36:57 PM By: Allen Derry PA-C Entered By: Allen Derry on 02/14/2023 14:36:57 -------------------------------------------------------------------------------- HPI Details Patient Name: Date of Service: [name] [facility]. 02/14/2023 2:30 PM Medical Record Number: [id number] Patient Account Number: [id number] Date of Birth/Sex: Treating RN: 02-May-1951 (72 y.o. [name]) Yevonne Pax Primary Care Provider: Kerby Nora Other Clinician: Referring Provider: Treating Provider/Extender: [name], [name] in Treatment: 2 History of Present Illness HPI Description: 01-31-2023 upon evaluation today patient appears to be doing poorly currently in regard to wounds on his right heel, right dorsal foot, and right lateral foot. Subsequently he does have known peripheral vascular disease and again this is something that he needs to I think Checked formally. This is what the majority of the conversation today hinged around and the patient voiced understanding as far as that is concerned. Fortunately I do not see any signs of active infection locally nor systemically which is great news. Patient does have a history of diabetes mellitus type 2, atrial fibrillation for which she is on long-term anticoagulant Therapy, hypertension, and coronary artery disease. Patient's hemoglobin A1c most recently was on  11-17-2022 and was 7.2 and currently he is on Eliquis and Plavix. 02-07-2023 upon evaluation today patient appears to be doing well currently in regard to his wounds all things considered I feel like we are still maintaining that does not seem like it is any worse is also not significantly better. I discussed with the patient that I do believe he would benefit from the arterial evaluation we still need to get this done as quickly as possible and subsequently we did put in a follow-up call with the vascular office today with regard to this. 02-14-2023 upon evaluation today patient appears to be doing a little better in regard to his wounds which are showing signs of loosening which is great news. With that being said he is experiencing an improvement overall in his symptoms and we are still waiting on the vascular evaluation want to get this result and consider whether or not his blood flow is good or not we will be able to make a better determination of next steps. For now we will try and avoid any aggressive sharp debridement to know that he has good arterial flow. [name], [name] ([id number]) [id number] Page 2 of 7 Electronic Signature(s) Signed: 02/17/2023 7:43:06 AM By: Allen Derry PA-C Entered By: Allen Derry on 02/17/2023 07:43:06 -------------------------------------------------------------------------------- Physical Exam Details Patient Name: Date of Service: [name] [facility]. 02/14/2023 2:30 PM Medical Record Number: [id number] Patient Account Number: [id number] Date of Birth/Sex: Treating RN: [birth year] (72 y.o. [name] Primary Care Provider: Kerby Nora Other Clinician: Referring Provider: Treating Provider/Extender: [name], [name] in Treatment: 2 Constitutional Well-nourished and well-hydrated in no acute distress. Respiratory normal breathing without difficulty. Psychiatric this patient is able to make decisions and demonstrates  good insight into disease process. Alert and Oriented x 3. pleasant and cooperative. Notes Upon inspection patient's wound manage patient actually showed signs of improvement with regard to  the slough buildup and eschar on the surface of the wounds. I do believe this is softening up which is good news. I do not see any signs of active infection at this time which is also good news. Electronic Signature(s) Signed: 02/17/2023 7:43:55 AM By: Allen Derry PA-C Entered By: Allen Derry on 02/17/2023 07:43:55 -------------------------------------------------------------------------------- Physician Orders Details Patient Name: Date of Service: [name] [facility]. 02/14/2023 2:30 PM Medical Record Number: [id number] Patient Account Number: [id number] Date of Birth/Sex: Treating RN: 10/09/1950 (72 y.o. [name] Primary Care Provider: Kerby Nora Other Clinician: Referring Provider: Treating Provider/Extender: [name], [name] in Treatment: 2 Verbal / Phone Orders: No Diagnosis Coding ICD-10 Coding Code Description E11.621 Type 2 diabetes mellitus with foot ulcer [name], [name] ([id number]) [id number] Page 3 of 7 L97.512 Non-pressure chronic ulcer of other part of right foot with fat layer exposed I48.0 Paroxysmal atrial fibrillation Z79.01 Long term (current) use of anticoagulants I10 Essential (primary) hypertension I25.10 Atherosclerotic heart disease of native coronary artery without angina pectoris Follow-up Appointments Return Appointment in 1 week. Bathing/ [employer] wounds with antibacterial soap and water. Anesthetic (Use 'Patient Medications' Section for Anesthetic Order Entry) Lidocaine applied to wound bed Wound Treatment Wound #1 - Calcaneus Wound Laterality: Right Cleanser: Byram Ancillary Kit - 15 Day Supply (Generic) 3 x Per Week/30 Days Discharge Instructions: Use supplies as instructed; Kit contains: (15) Saline  Bullets; (15) 3x3 Gauze; 15 pr Gloves Cleanser: Soap and Water 3 x Per Week/30 Days Discharge Instructions: Gently cleanse wound with antibacterial soap, rinse and pat dry prior to dressing wounds Prim Dressing: IODOFLEX 0.9% Cadexomer Iodine Pad (Generic) 3 x Per Week/30 Days ary Discharge Instructions: Apply Iodoflex to wound bed only as directed. Secondary Dressing: (BORDER) Zetuvit Plus SILICONE BORDER Dressing 4x4 (in/in) (Generic) 3 x Per Week/30 Days Discharge Instructions: Please do not put silicone bordered dressings under wraps. Use non-bordered dressing only. Wound #2 - Foot Wound Laterality: Dorsal, Right, Distal Cleanser: Byram Ancillary Kit - 15 Day Supply (Generic) 3 x Per Week/30 Days Discharge Instructions: Use supplies as instructed; Kit contains: (15) Saline Bullets; (15) 3x3 Gauze; 15 pr Gloves Cleanser: Soap and Water 3 x Per Week/30 Days Discharge Instructions: Gently cleanse wound with antibacterial soap, rinse and pat dry prior to dressing wounds Prim Dressing: IODOFLEX 0.9% Cadexomer Iodine Pad (Generic) 3 x Per Week/30 Days ary Discharge Instructions: Apply Iodoflex to wound bed only as directed. Secondary Dressing: (BORDER) Zetuvit Plus SILICONE BORDER Dressing 4x4 (in/in) (Generic) 3 x Per Week/30 Days Discharge Instructions: Please do not put silicone bordered dressings under wraps. Use non-bordered dressing only. Wound #3 - Foot Wound Laterality: Dorsal, Right, Proximal Cleanser: Soap and Water 3 x Per Week/30 Days Discharge Instructions: Gently cleanse wound with antibacterial soap, rinse and pat dry prior to dressing wounds Prim Dressing: IODOFLEX 0.9% Cadexomer Iodine Pad (Generic) 3 x Per Week/30 Days ary Discharge Instructions: Apply Iodoflex to wound bed only as directed. Secondary Dressing: (BORDER) Zetuvit Plus SILICONE BORDER Dressing 4x4 (in/in) (Generic) 3 x Per Week/30 Days Discharge Instructions: Please do not put silicone bordered dressings under  wraps. Use non-bordered dressing only. Electronic Signature(s) Signed: 02/16/2023 12:58:51 PM By: Yevonne Pax RN Signed: 02/16/2023 5:52:14 PM By: Allen Derry PA-C Entered By: Yevonne Pax on 02/14/2023 14:41:14 Problem List Details -------------------------------------------------------------------------------- [name] ([id number]) [id number] Page 4 of 7 Patient Name: Date of Service: [name] H. 02/14/2023 2:30 PM Medical Record Number: [id number] Patient Account Number: [id number]  Date of Birth/Sex: Treating RN: December 05, 1950 (72 y.o. [name]) Yevonne Pax Primary Care Provider: Kerby Nora Other Clinician: Referring Provider: Treating Provider/Extender: [name], [name] in Treatment: 2 Active Problems ICD-10 Encounter Code Description Active Date MDM Diagnosis E11.621 Type 2 diabetes mellitus with foot ulcer 01/31/2023 No Yes L97.512 Non-pressure chronic ulcer of other part of right foot with fat layer exposed 01/31/2023 No Yes I48.0 Paroxysmal atrial fibrillation 01/31/2023 No Yes Z79.01 Long term (current) use of anticoagulants 01/31/2023 No Yes I10 Essential (primary) hypertension 01/31/2023 No Yes I25.10 Atherosclerotic heart disease of native coronary artery without angina pectoris 01/31/2023 No Yes Inactive Problems Resolved Problems Electronic Signature(s) Signed: 02/14/2023 2:36:50 PM By: Allen Derry PA-C Entered By: Allen Derry on 02/14/2023 14:36:50 -------------------------------------------------------------------------------- Progress Note Details Patient Name: Date of Service: [name] [facility] H. 02/14/2023 2:30 PM Medical Record Number: [id number] Patient Account Number: [id number] Date of Birth/Sex: Treating RN: [birth year] (72 y.o. [name] Primary Care Provider: Kerby Nora Other Clinician: Referring Provider: Treating Provider/Extender: [name], [name] in Treatment: 2 Subjective Chief  Complaint Information obtained from Patient Right foot ulcers [name], [name] ([id number]) [id number] Page 5 of 7 History of Present Illness (HPI) 01-31-2023 upon evaluation today patient appears to be doing poorly currently in regard to wounds on his right heel, right dorsal foot, and right lateral foot. Subsequently he does have known peripheral vascular disease and again this is something that he needs to I think Checked formally. This is what the majority of the conversation today hinged around and the patient voiced understanding as far as that is concerned. Fortunately I do not see any signs of active infection locally nor systemically which is great news. Patient does have a history of diabetes mellitus type 2, atrial fibrillation for which she is on long-term anticoagulant Therapy, hypertension, and coronary artery disease. Patient's hemoglobin A1c most recently was on 11-17-2022 and was 7.2 and currently he is on Eliquis and Plavix. 02-07-2023 upon evaluation today patient appears to be doing well currently in regard to his wounds all things considered I feel like we are still maintaining that does not seem like it is any worse is also not significantly better. I discussed with the patient that I do believe he would benefit from the arterial evaluation we still need to get this done as quickly as possible and subsequently we did put in a follow-up call with the vascular office today with regard to this. 02-14-2023 upon evaluation today patient appears to be doing a little better in regard to his wounds which are showing signs of loosening which is great news. With that being said he is experiencing an improvement overall in his symptoms and we are still waiting on the vascular evaluation want to get this result and consider whether or not his blood flow is good or not we will be able to make a better determination of next steps. For now we will try and avoid any  aggressive sharp debridement to know that he has good arterial flow. Objective Constitutional Well-nourished and well-hydrated in no acute distress. Vitals Time Taken: 2:27 PM, Height: 66 in, Weight: 250 lbs, BMI: 40.3, Temperature: 97.9 F, Pulse: 63 bpm, Respiratory Rate: 18 breaths/min, Blood Pressure: 116/66 mmHg. Respiratory normal breathing without difficulty. Psychiatric this patient is able to make decisions and demonstrates good insight into disease process. Alert and Oriented x 3. pleasant and cooperative. General Notes: Upon inspection patient's wound manage patient actually showed signs of improvement with regard to  the slough buildup and eschar on the surface of the wounds. I do believe this is softening up which is good news. I do not see any signs of active infection at this time which is also good news. Integumentary (Hair, Skin) Wound #1 status is Open. Original cause of wound was Gradually Appeared. The date acquired was: 12/10/2022. The wound has been in treatment 2 weeks. The wound is located on the Right Calcaneus. The wound measures 3cm length x 1cm width x 0.1cm depth; 2.356cm^2 area and 0.236cm^3 volume. There is Fat Layer (Subcutaneous Tissue) exposed. There is no tunneling or undermining noted. There is a medium amount of serosanguineous drainage noted. There is medium (34-66%) red granulation within the wound bed. There is no necrotic tissue within the wound bed. Wound #2 status is Open. Original cause of wound was Gradually Appeared. The date acquired was: 12/10/2022. The wound has been in treatment 2 weeks. The wound is located on the Right,Distal,Dorsal Foot. The wound measures 1cm length x 1cm width x 0.1cm depth; 0.785cm^2 area and 0.079cm^3 volume. There is Fat Layer (Subcutaneous Tissue) exposed. There is no tunneling or undermining noted. There is a medium amount of serosanguineous drainage noted. There is no granulation within the wound bed. There is no necrotic  tissue within the wound bed. Wound #3 status is Open. Original cause of wound was Gradually Appeared. The date acquired was: 12/10/2022. The wound has been in treatment 2 weeks. The wound is located on the Right,Proximal,Dorsal Foot. The wound measures 1.1cm length x 2cm width x 0.1cm depth; 1.728cm^2 area and 0.173cm^3 volume. There is Fat Layer (Subcutaneous Tissue) exposed. There is no tunneling or undermining noted. There is a medium amount of serosanguineous drainage noted. There is no granulation within the wound bed. There is a large (67-100%) amount of necrotic tissue within the wound bed including Adherent Slough. Assessment Active Problems ICD-10 Type 2 diabetes mellitus with foot ulcer Non-pressure chronic ulcer of other part of right foot with fat layer exposed Paroxysmal atrial fibrillation Long term (current) use of anticoagulants Essential (primary) hypertension Atherosclerotic heart disease of native coronary artery without angina pectoris Plan Follow-up Appointments: Return Appointment in 1 week. Bathing/ Shower/ Hygiene: Wash wounds with antibacterial soap and water. [name], [name] ([id number]) [id number] Page 6 of 7 Anesthetic (Use 'Patient Medications' Section for Anesthetic Order Entry): Lidocaine applied to wound bed WOUND #1: - Calcaneus Wound Laterality: Right Cleanser: Byram Ancillary Kit - 15 Day Supply (Generic) 3 x Per Week/30 Days Discharge Instructions: Use supplies as instructed; Kit contains: (15) Saline Bullets; (15) 3x3 Gauze; 15 pr Gloves Cleanser: Soap and Water 3 x Per Week/30 Days Discharge Instructions: Gently cleanse wound with antibacterial soap, rinse and pat dry prior to dressing wounds Prim Dressing: IODOFLEX 0.9% Cadexomer Iodine Pad (Generic) 3 x Per Week/30 Days ary Discharge Instructions: Apply Iodoflex to wound bed only as directed. Secondary Dressing: (BORDER) Zetuvit Plus SILICONE BORDER Dressing 4x4 (in/in)  (Generic) 3 x Per Week/30 Days Discharge Instructions: Please do not put silicone bordered dressings under wraps. Use non-bordered dressing only. WOUND #2: - Foot Wound Laterality: Dorsal, Right, Distal Cleanser: Byram Ancillary Kit - 15 Day Supply (Generic) 3 x Per Week/30 Days Discharge Instructions: Use supplies as instructed; Kit contains: (15) Saline Bullets; (15) 3x3 Gauze; 15 pr Gloves Cleanser: Soap and Water 3 x Per Week/30 Days Discharge Instructions: Gently cleanse wound with antibacterial soap, rinse and pat dry prior to dressing wounds Prim Dressing: IODOFLEX 0.9% Cadexomer Iodine Pad (Generic) 3 x  Per Week/30 Days ary Discharge Instructions: Apply Iodoflex to wound bed only as directed. Secondary Dressing: (BORDER) Zetuvit Plus SILICONE BORDER Dressing 4x4 (in/in) (Generic) 3 x Per Week/30 Days Discharge Instructions: Please do not put silicone bordered dressings under wraps. Use non-bordered dressing only. WOUND #3: - Foot Wound Laterality: Dorsal, Right, Proximal Cleanser: Soap and Water 3 x Per Week/30 Days Discharge Instructions: Gently cleanse wound with antibacterial soap, rinse and pat dry prior to dressing wounds Prim Dressing: IODOFLEX 0.9% Cadexomer Iodine Pad (Generic) 3 x Per Week/30 Days ary Discharge Instructions: Apply Iodoflex to wound bed only as directed. Secondary Dressing: (BORDER) Zetuvit Plus SILICONE BORDER Dressing 4x4 (in/in) (Generic) 3 x Per Week/30 Days Discharge Instructions: Please do not put silicone bordered dressings under wraps. Use non-bordered dressing only. 1. I am going to recommend based on what we are seeing that we have the patient going continue with the Iodoflex currently I think this is doing decently well. 2 we will be having his vascular evaluation in a couple of days once that is complete we will have a better idea of exactly what is going on and where things stand going forward. Will also be able to make more specific  recommendations. We will see patient back for reevaluation in 1 week here in the clinic. If anything worsens or changes patient will contact our office for additional recommendations. Electronic Signature(s) Signed: 02/17/2023 7:44:45 AM By: Allen Derry PA-C Entered By: Allen Derry on 02/17/2023 07:44:44 -------------------------------------------------------------------------------- SuperBill Details Patient Name: Date of Service: [name] [facility] H. 02/14/2023 Medical Record Number: [id number] Patient Account Number: [id number] Date of Birth/Sex: Treating RN: [birth year] (72 y.o. [name]) Yevonne Pax Primary Care Provider: Kerby Nora Other Clinician: Referring Provider: Treating Provider/Extender: [name], [name] in Treatment: 2 Diagnosis Coding ICD-10 Codes Code Description E11.621 Type 2 diabetes mellitus with foot ulcer L97.512 Non-pressure chronic ulcer of other part of right foot with fat layer exposed I48.0 Paroxysmal atrial fibrillation Z79.01 Long term (current) use of anticoagulants I10 Essential (primary) hypertension I25.10 Atherosclerotic heart disease of native coronary artery without angina pectoris Facility Procedures : SHAYDON, LEASE 7 CPT4 Code: HN H ([id number]) [id number] Description: [id number] - WOUND CARE VISIT-LEV 4 EST PT Modifier: [id number] Page 7 of 7 1 Quantity: Electronic Signature(s) Signed: 02/16/2023 12:58:51 PM By: Yevonne Pax RN Signed: 02/16/2023 5:52:14 PM By: Allen Derry PA-C Entered By: Yevonne Pax on 02/14/2023 14:43:01

## 2023-02-15 MED ORDER — HYDROCODONE-ACETAMINOPHEN 5-325 MG PO TABS: 1.0000 | ORAL_TABLET | Freq: Every day | ORAL | 0 refills | Status: DC | PRN

## 2023-02-16 ENCOUNTER — Ambulatory Visit (INDEPENDENT_AMBULATORY_CARE_PROVIDER_SITE_OTHER): Payer: PPO

## 2023-02-16 DIAGNOSIS — L97512 Non-pressure chronic ulcer of other part of right foot with fat layer exposed: Secondary | ICD-10-CM

## 2023-02-16 DIAGNOSIS — L97509 Non-pressure chronic ulcer of other part of unspecified foot with unspecified severity: Secondary | ICD-10-CM

## 2023-02-16 DIAGNOSIS — E13621 Other specified diabetes mellitus with foot ulcer: Secondary | ICD-10-CM

## 2023-02-16 NOTE — Progress Notes (Signed)
William, [name] ([id number]) [id number] Page 1 of 13 Visit Report for 02/14/2023 Arrival Information Details Patient Name: Date of Service: [name] [facility] H. 02/14/2023 2:30 PM Medical Record Number: [id number] Patient Account Number: [id number] Date of Birth/Sex: Treating RN: 08-04-1951 (72 y.o. [name]) Yevonne Pax Primary Care Nijel Flink: Kerby Nora Other Clinician: Referring Chrsitopher Wik: Treating Kadin Canipe/Extender: [name], [name] in Treatment: 2 Visit Information History Since Last Visit Added or deleted any medications: No Patient Arrived: Ambulatory Any new allergies or adverse reactions: No Arrival Time: 14:24 Had a fall or experienced change in No Accompanied By: self activities of daily living that may affect Transfer Assistance: None risk of falls: Patient Identification Verified: Yes Signs or symptoms of abuse/neglect since last visito No Secondary Verification Process Completed: Yes Hospitalized since last visit: No Patient Requires Transmission-Based Precautions: No Implantable device outside of the clinic excluding No Patient Has Alerts: Yes cellular tissue based products placed in the center Patient Alerts: Patient on Blood Thinner since last visit: Diabetes type 2 Has Dressing in Place as Prescribed: Yes Eliquis/Plavix Pain Present Now: No Bilateral PAD 01/31/23 Electronic Signature(s) Signed: 02/16/2023 12:58:51 PM By: Yevonne Pax RN Entered By: Yevonne Pax on 02/14/2023 14:27:32 -------------------------------------------------------------------------------- Clinic Level of Care Assessment Details Patient Name: Date of Service: [name] [facility] H. 02/14/2023 2:30 PM Medical Record Number: [id number] Patient Account Number: [id number] Date of Birth/Sex: Treating RN: Nov 16, 1950 (72 y.o. [name]) Yevonne Pax Primary Care Dalan Cowger: Kerby Nora Other Clinician: Referring Demaya Hardge: Treating Mannat Benedetti/Extender: [name],  [name] in Treatment: 2 Clinic Level of Care Assessment Items TOOL 4 Quantity Score X- 1 0 Use when only an EandM is performed on FOLLOW-UP visit ASSESSMENTS - Nursing Assessment / Reassessment X- 1 10 Reassessment of Co-morbidities (includes updates in patient status) X- 1 5 Reassessment of Adherence to Treatment Plan William, [name] ([id number]) [id number] Page 2 of 13 ASSESSMENTS - Wound and Skin A ssessment / Reassessment []  - Simple Wound Assessment / Reassessment - one wound 0 X- 3 5 Complex Wound Assessment / Reassessment - multiple wounds []  - 0 Dermatologic / Skin Assessment (not related to wound area) ASSESSMENTS - Focused Assessment []  - 0 Circumferential Edema Measurements - multi extremities []  - 0 Nutritional Assessment / Counseling / Intervention []  - 0 Lower Extremity Assessment (monofilament, tuning fork, pulses) []  - 0 Peripheral Arterial Disease Assessment (using hand held doppler) ASSESSMENTS - Ostomy and/or Continence Assessment and Care []  - 0 Incontinence Assessment and Management []  - 0 Ostomy Care Assessment and Management (repouching, etc.) PROCESS - Coordination of Care X - Simple Patient / Family Education for ongoing care 1 15 []  - 0 Complex (extensive) Patient / Family Education for ongoing care []  - 0 Staff obtains Chiropractor, Records, T Results / Process Orders est []  - 0 Staff telephones HHA, Nursing Homes / Clarify orders / etc []  - 0 Routine Transfer to another Facility (non-emergent condition) []  - 0 Routine Hospital Admission (non-emergent condition) []  - 0 New Admissions / Manufacturing engineer / Ordering NPWT Apligraf, etc. , []  - 0 Emergency Hospital Admission (emergent condition) X- 1 10 Simple Discharge Coordination []  - 0 Complex (extensive) Discharge Coordination PROCESS - Special Needs []  - 0 Pediatric / Minor Patient Management []  - 0 Isolation Patient Management []  - 0 Hearing /  Language / Visual special needs []  - 0 Assessment of Community assistance (transportation, D/C planning, etc.) []  - 0 Additional assistance / Altered mentation []  - 0 Support Surface(s) Assessment (bed, cushion,  seat, etc.) INTERVENTIONS - Wound Cleansing / Measurement []  - 0 Simple Wound Cleansing - one wound X- 3 5 Complex Wound Cleansing - multiple wounds X- 1 5 Wound Imaging (photographs - any number of wounds) []  - 0 Wound Tracing (instead of photographs) []  - 0 Simple Wound Measurement - one wound X- 3 5 Complex Wound Measurement - multiple wounds INTERVENTIONS - Wound Dressings X - Small Wound Dressing one or multiple wounds 3 10 []  - 0 Medium Wound Dressing one or multiple wounds []  - 0 Large Wound Dressing one or multiple wounds []  - 0 Application of Medications - topical []  - 0 Application of Medications - injection INTERVENTIONS - Miscellaneous []  - 0 External ear exam William, [name] ([id number]) [id number] Page 3 of 13 []  - 0 Specimen Collection (cultures, biopsies, blood, body fluids, etc.) []  - 0 Specimen(s) / Culture(s) sent or taken to Lab for analysis []  - 0 Patient Transfer (multiple staff / Michiel Sites Lift / Similar devices) []  - 0 Simple Staple / Suture removal (25 or less) []  - 0 Complex Staple / Suture removal (26 or more) []  - 0 Hypo / Hyperglycemic Management (close monitor of Blood Glucose) []  - 0 Ankle / Brachial Index (ABI) - do not check if billed separately X- 1 5 Vital Signs Has the patient been seen at the hospital within the last three years: Yes Total Score: 125 Level Of Care: New/Established - Level 4 Electronic Signature(s) Signed: 02/16/2023 12:58:51 PM By: Yevonne Pax RN Entered By: Yevonne Pax on 02/14/2023 14:42:41 -------------------------------------------------------------------------------- Encounter Discharge Information Details Patient Name: Date of Service: [name] [facility] H. 02/14/2023 2:30  PM Medical Record Number: [id number] Patient Account Number: [id number] Date of Birth/Sex: Treating RN: November 17, 1950 (72 y.o. [name] Primary Care Sumedha Munnerlyn: Kerby Nora Other Clinician: Referring Jene Huq: Treating Maryssa Giampietro/Extender: [name], [name] in Treatment: 2 Encounter Discharge Information Items Discharge Condition: Stable Ambulatory Status: Cane Discharge Destination: Home Transportation: Private Auto Accompanied By: self Schedule Follow-up Appointment: Yes Clinical Summary of Care: Electronic Signature(s) Signed: 02/16/2023 12:58:51 PM By: Yevonne Pax RN Entered By: Yevonne Pax on 02/14/2023 14:43:53 -------------------------------------------------------------------------------- Lower Extremity Assessment Details Patient Name: Date of Service: [name] HN H. 02/14/2023 2:30 PM SCHON, ETHEREDGE ([id number]) [id number] Page 4 of 13 Medical Record Number: [id number] Patient Account Number: [id number] Date of Birth/Sex: Treating RN: Jul 04, 1951 (72 y.o. [name]) Yevonne Pax Primary Care Cassidy Tashiro: Kerby Nora Other Clinician: Referring Alesandra Smart: Treating Jakya Dovidio/Extender: [name], [name] in Treatment: 2 Edema Assessment Assessed: [Left: No] [Right: No] Edema: [Left: Ye] [Right: s] Calf Left: Right: Point of Measurement: 36 cm From Medial Instep 40 cm Ankle Left: Right: Point of Measurement: 15 cm From Medial Instep 24 cm Knee To Floor Left: Right: From Medial Instep 40 cm Vascular Assessment Pulses: Dorsalis Pedis Palpable: [Right:Yes] Electronic Signature(s) Signed: 02/16/2023 12:58:51 PM By: Yevonne Pax RN Entered By: Yevonne Pax on 02/14/2023 14:34:51 -------------------------------------------------------------------------------- Multi Wound Chart Details Patient Name: Date of Service: [name] [facility] H. 02/14/2023 2:30 PM Medical Record Number: [id number] Patient Account Number: [id number] Date of  Birth/Sex: Treating RN: [birth year] (72 y.o. [name]) Yevonne Pax Primary Care Kamarri Fischetti: Kerby Nora Other Clinician: Referring Brieanne Mignone: Treating Naviyah Schaffert/Extender: [name], [name] in Treatment: 2 Vital Signs Height(in): 66 Pulse(bpm): 63 Weight(lbs): 250 Blood Pressure(mmHg): 116/66 Body Mass Index(BMI): 40.3 Temperature(F): 97.9 Respiratory Rate(breaths/min): 18 [1:Photos:] [3:[id number] Page 5 of 13] Right Calcaneus Right, Distal, Dorsal Foot Right, Proximal, Dorsal Foot Wound Location: Gradually Appeared Gradually  Appeared Gradually Appeared Wounding Event: Diabetic Wound/Ulcer of the Lower Diabetic Wound/Ulcer of the Lower Diabetic Wound/Ulcer of the Lower Primary Etiology: Extremity Extremity Extremity Congestive Heart Failure, Coronary Congestive Heart Failure, Coronary Congestive Heart Failure, Coronary Comorbid History: Artery Disease, Hypertension, Type II Artery Disease, Hypertension, Type II Artery Disease, Hypertension, Type II Diabetes, Neuropathy Diabetes, Neuropathy Diabetes, Neuropathy 12/10/2022 12/10/2022 12/10/2022 Date Acquired: 2 2 2  Weeks of Treatment: Open Open Open Wound Status: No No No Wound Recurrence: Yes No No Clustered Wound: 3x1x0.1 1x1x0.1 1.1x2x0.1 Measurements L x W x D (cm) 2.356 0.785 1.728 A (cm) : rea 0.236 0.079 0.173 Volume (cm) : 44.90% 9.10% 31.20% % Reduction in A rea: 44.70% 8.10% 31.10% % Reduction in Volume: Grade 1 Grade 1 Grade 1 Classification: Medium Medium Medium Exudate A mount: Serosanguineous Serosanguineous Serosanguineous Exudate Type: red, brown red, brown red, brown Exudate Color: Medium (34-66%) None Present (0%) None Present (0%) Granulation A mount: Red N/A N/A Granulation Quality: None Present (0%) None Present (0%) Large (67-100%) Necrotic A mount: Fat Layer (Subcutaneous Tissue): Yes Fat Layer (Subcutaneous Tissue): Yes Fat Layer (Subcutaneous Tissue):  Yes Exposed Structures: Fascia: No Fascia: No Fascia: No Tendon: No Tendon: No Tendon: No Muscle: No Muscle: No Muscle: No Joint: No Joint: No Joint: No Bone: No Bone: No Bone: No None Small (1-33%) Small (1-33%) Epithelialization: Treatment Notes Electronic Signature(s) Signed: 02/16/2023 12:58:51 PM By: Yevonne Pax RN Entered By: Yevonne Pax on 02/14/2023 14:34:56 -------------------------------------------------------------------------------- Multi-Disciplinary Care Plan Details Patient Name: Date of Service: [name] HN H. 02/14/2023 2:30 PM Medical Record Number: [id number] Patient Account Number: [id number] Date of Birth/Sex: Treating RN: 05/22/1951 (72 y.o. [name]) Yevonne Pax Primary Care Dequita Schleicher: Kerby Nora Other Clinician: Referring Tagg Eustice: Treating Iriel Nason/Extender: [name], [name] in Treatment: 2 Active Inactive Necrotic Tissue Nursing Diagnoses: Impaired tissue integrity related to necrotic/devitalized tissue Knowledge deficit related to management of necrotic/devitalized tissue Goals: Necrotic/devitalized tissue will be minimized in the wound bed Date Initiated: 02/01/2023 Target Resolution Date: 03/04/2023 Goal Status: Active Patient/caregiver will verbalize understanding of reason and process for debridement of necrotic tissue THANG, CRAVER ([id number]) [id number] Page 6 of 13 Date Initiated: 02/01/2023 Target Resolution Date: 03/04/2023 Goal Status: Active Interventions: Assess patient pain level pre-, during and post procedure and prior to discharge Provide education on necrotic tissue and debridement process Treatment Activities: Apply topical anesthetic as ordered : 01/31/2023 Notes: Orientation to the Wound Care Program Nursing Diagnoses: Knowledge deficit related to the wound healing center program Goals: Patient/caregiver will verbalize understanding of the Wound Healing Center Program Date  Initiated: 02/01/2023 Target Resolution Date: 02/16/2023 Goal Status: Active Interventions: Provide education on orientation to the wound center Notes: Pressure Nursing Diagnoses: Knowledge deficit related to causes and risk factors for pressure ulcer development Knowledge deficit related to management of pressures ulcers Potential for impaired tissue integrity related to pressure, friction, moisture, and shear Goals: Patient will remain free from development of additional pressure ulcers Date Initiated: 02/01/2023 Target Resolution Date: 03/04/2023 Goal Status: Active Patient/caregiver will verbalize risk factors for pressure ulcer development Date Initiated: 02/01/2023 Target Resolution Date: 03/04/2023 Goal Status: Active Patient/caregiver will verbalize understanding of pressure ulcer management Date Initiated: 02/01/2023 Target Resolution Date: 03/04/2023 Goal Status: Active Interventions: Assess potential for pressure ulcer upon admission and as needed Provide education on pressure ulcers Treatment Activities: T ordered outside of clinic : 01/31/2023 est Notes: Wound/Skin Impairment Nursing Diagnoses: Impaired tissue integrity Knowledge deficit related to ulceration/compromised skin integrity Goals: Patient/caregiver will verbalize understanding of  skin care regimen Date Initiated: 02/01/2023 Target Resolution Date: 03/04/2023 Goal Status: Active Ulcer/skin breakdown will have a volume reduction of 30% by week 4 Date Initiated: 02/01/2023 Target Resolution Date: 03/04/2023 Goal Status: Active Ulcer/skin breakdown will have a volume reduction of 50% by week 8 Date Initiated: 02/01/2023 Target Resolution Date: 04/04/2023 Goal Status: Active Ulcer/skin breakdown will have a volume reduction of 80% by week 12 Date Initiated: 02/01/2023 Target Resolution Date: 05/05/2023 Goal Status: Active Interventions: Assess patient/caregiver ability to obtain necessary supplies Assess patient/caregiver  ability to perform ulcer/skin care regimen upon admission and as needed Assess ulceration(s) every visit NICOLAE, DANN ([id number]) [id number] Page 7 of 13 Provide education on ulcer and skin care Treatment Activities: Referred to DME Mung Rinker for dressing supplies : 01/31/2023 Skin care regimen initiated : 01/31/2023 Notes: Electronic Signature(s) Signed: 02/16/2023 12:58:51 PM By: Yevonne Pax RN Entered By: Yevonne Pax on 02/14/2023 14:43:10 -------------------------------------------------------------------------------- Pain Assessment Details Patient Name: Date of Service: [name] HN H. 02/14/2023 2:30 PM Medical Record Number: [id number] Patient Account Number: [id number] Date of Birth/Sex: Treating RN: [birth year] (72 y.o. [name]) Yevonne Pax Primary Care Riyah Bardon: Kerby Nora Other Clinician: Referring Ruth Tully: Treating Kylen Schliep/Extender: [name], [name] in Treatment: 2 Active Problems Location of Pain Severity and Description of Pain Patient Has Paino No Site Locations Pain Management and Medication Current Pain Management: Electronic Signature(s) Signed: 02/16/2023 12:58:51 PM By: Yevonne Pax RN Entered By: Yevonne Pax on 02/14/2023 14:28:37 [name] ([id number]) [id number] Page 8 of 13 -------------------------------------------------------------------------------- Patient/Caregiver Education Details Patient Name: Date of Service: [name] 6/18/2024andnbsp2:30 PM Medical Record Number: [id number] Patient Account Number: [id number] Date of Birth/Gender: Treating RN: 1951-06-23 (72 y.o. [name]) Yevonne Pax Primary Care Physician: Kerby Nora Other Clinician: Referring Physician: Treating Physician/Extender: [name], [name] in Treatment: 2 Education Assessment Education Provided To: Patient Education Topics Provided Wound/Skin Impairment: Handouts: Caring for Your  Ulcer Methods: Explain/Verbal Responses: State content correctly Electronic Signature(s) Signed: 02/16/2023 12:58:51 PM By: Yevonne Pax RN Entered By: Yevonne Pax on 02/14/2023 14:35:30 -------------------------------------------------------------------------------- Wound Assessment Details Patient Name: Date of Service: [name] HN H. 02/14/2023 2:30 PM Medical Record Number: [id number] Patient Account Number: [id number] Date of Birth/Sex: Treating RN: 1951/08/29 (72 y.o. [name]) Yevonne Pax Primary Care Zephaniah Enyeart: Kerby Nora Other Clinician: Referring Reynoldo Mainer: Treating Henna Derderian/Extender: [name], [name] in Treatment: 2 Wound Status Wound Number: 1 Primary Diabetic Wound/Ulcer of the Lower Extremity Etiology: Wound Location: Right Calcaneus Wound Open Wounding Event: Gradually Appeared Status: Date Acquired: 12/10/2022 Comorbid Congestive Heart Failure, Coronary Artery Disease, Weeks Of Treatment: 2 History: Hypertension, Type II Diabetes, Neuropathy Clustered Wound: Yes Photos MANUELITO, BEEGHLY ([id number]) [id number] Page 9 of 13 Wound Measurements Length: (cm) 3 Width: (cm) 1 Depth: (cm) 0.1 Area: (cm) 2.356 Volume: (cm) 0.236 % Reduction in Area: 44.9% % Reduction in Volume: 44.7% Epithelialization: None Tunneling: No Undermining: No Wound Description Classification: Grade 1 Exudate Amount: Medium Exudate Type: Serosanguineous Exudate Color: red, brown Foul Odor After Cleansing: No Slough/Fibrino No Wound Bed Granulation Amount: Medium (34-66%) Exposed Structure Granulation Quality: Red Fascia Exposed: No Necrotic Amount: None Present (0%) Fat Layer (Subcutaneous Tissue) Exposed: Yes Tendon Exposed: No Muscle Exposed: No Joint Exposed: No Bone Exposed: No Treatment Notes Wound #1 (Calcaneus) Wound Laterality: Right Cleanser Byram Ancillary Kit - 15 Day Supply Discharge Instruction: Use supplies as instructed; Kit  contains: (15) Saline Bullets; (15) 3x3 Gauze; 15 pr Gloves Soap and Water Discharge Instruction: Gently cleanse wound  with antibacterial soap, rinse and pat dry prior to dressing wounds Peri-Wound Care Topical Primary Dressing IODOFLEX 0.9% Cadexomer Iodine Pad Discharge Instruction: Apply Iodoflex to wound bed only as directed. Secondary Dressing (BORDER) Zetuvit Plus SILICONE BORDER Dressing 4x4 (in/in) Discharge Instruction: Please do not put silicone bordered dressings under wraps. Use non-bordered dressing only. Secured With Compression Wrap Compression Stockings Facilities manager) Signed: 02/16/2023 12:58:51 PM By: Yevonne Pax RN Entered By: Yevonne Pax on 02/14/2023 14:33:17 Gaines, [name] ([id number]) [id number] Page 10 of 13 -------------------------------------------------------------------------------- Wound Assessment Details Patient Name: Date of Service: [name] [facility] H. 02/14/2023 2:30 PM Medical Record Number: [id number] Patient Account Number: [id number] Date of Birth/Sex: Treating RN: [birth year] (72 y.o. [name]) Yevonne Pax Primary Care Cheyne Boulden: Kerby Nora Other Clinician: Referring Brittin Belnap: Treating Charese Abundis/Extender: [name], [name] in Treatment: 2 Wound Status Wound Number: 2 Primary Diabetic Wound/Ulcer of the Lower Extremity Etiology: Wound Location: Right, Distal, Dorsal Foot Wound Open Wounding Event: Gradually Appeared Status: Date Acquired: 12/10/2022 Comorbid Congestive Heart Failure, Coronary Artery Disease, Weeks Of Treatment: 2 History: Hypertension, Type II Diabetes, Neuropathy Clustered Wound: No Photos Wound Measurements Length: (cm) 1 Width: (cm) 1 Depth: (cm) 0.1 Area: (cm) 0.785 Volume: (cm) 0.079 % Reduction in Area: 9.1% % Reduction in Volume: 8.1% Epithelialization: Small (1-33%) Tunneling: No Undermining: No Wound Description Classification: Grade 1 Exudate Amount:  Medium Exudate Type: Serosanguineous Exudate Color: red, brown Foul Odor After Cleansing: No Slough/Fibrino No Wound Bed Granulation Amount: None Present (0%) Exposed Structure Necrotic Amount: None Present (0%) Fascia Exposed: No Fat Layer (Subcutaneous Tissue) Exposed: Yes Tendon Exposed: No Muscle Exposed: No Joint Exposed: No Bone Exposed: No Treatment Notes Wound #2 (Foot) Wound Laterality: Dorsal, Right, Distal Cleanser Byram Ancillary Kit - 15 Day Supply Discharge Instruction: Use supplies as instructed; Kit contains: (15) Saline Bullets; (15) 3x3 Gauze; 15 pr [address] SALAAM, CERESA ([id number]) [id number] Page 11 of 13 Discharge Instruction: Gently cleanse wound with antibacterial soap, rinse and pat dry prior to dressing wounds Peri-Wound Care Topical Primary Dressing IODOFLEX 0.9% Cadexomer Iodine Pad Discharge Instruction: Apply Iodoflex to wound bed only as directed. Secondary Dressing (BORDER) Zetuvit Plus SILICONE BORDER Dressing 4x4 (in/in) Discharge Instruction: Please do not put silicone bordered dressings under wraps. Use non-bordered dressing only. Secured With Compression Wrap Compression Stockings Facilities manager) Signed: 02/16/2023 12:58:51 PM By: Yevonne Pax RN Entered By: Yevonne Pax on 02/14/2023 14:33:57 -------------------------------------------------------------------------------- Wound Assessment Details Patient Name: Date of Service: [name] HN H. 02/14/2023 2:30 PM Medical Record Number: [id number] Patient Account Number: [id number] Date of Birth/Sex: Treating RN: [birth year] (72 y.o. [name]) Yevonne Pax Primary Care Haide Klinker: Kerby Nora Other Clinician: Referring Jene Oravec: Treating Jermani Pund/Extender: [name], [name] in Treatment: 2 Wound Status Wound Number: 3 Primary Diabetic Wound/Ulcer of the Lower Extremity Etiology: Wound Location: Right, Proximal, Dorsal  Foot Wound Open Wounding Event: Gradually Appeared Status: Date Acquired: 12/10/2022 Comorbid Congestive Heart Failure, Coronary Artery Disease, Weeks Of Treatment: 2 History: Hypertension, Type II Diabetes, Neuropathy Clustered Wound: No Photos Wound Measurements Length: (cm) 1.1 Width: (cm) 2 Depth: (cm) 0.1 Area: (cm) 1.728 LAKEITH, SHAMBAUGH ([id number]) Volume: (cm) 0.173 % Reduction in Area: 31.2% % Reduction in Volume: 31.1% Epithelialization: Small (1-33%) Tunneling: No [phone].pdf Page 12 of 13 Undermining: No Wound Description Classification: Grade 1 Exudate Amount: Medium Exudate Type: Serosanguineous Exudate Color: red, brown Foul Odor After Cleansing: No Slough/Fibrino Yes Wound Bed Granulation Amount: None Present (0%) Exposed Structure Necrotic Amount:  Large (67-100%) Fascia Exposed: No Necrotic Quality: Adherent Slough Fat Layer (Subcutaneous Tissue) Exposed: Yes Tendon Exposed: No Muscle Exposed: No Joint Exposed: No Bone Exposed: No Treatment Notes Wound #3 (Foot) Wound Laterality: Dorsal, Right, Proximal Cleanser Soap and Water Discharge Instruction: Gently cleanse wound with antibacterial soap, rinse and pat dry prior to dressing wounds Peri-Wound Care Topical Primary Dressing IODOFLEX 0.9% Cadexomer Iodine Pad Discharge Instruction: Apply Iodoflex to wound bed only as directed. Secondary Dressing (BORDER) Zetuvit Plus SILICONE BORDER Dressing 4x4 (in/in) Discharge Instruction: Please do not put silicone bordered dressings under wraps. Use non-bordered dressing only. Secured With Compression Wrap Compression Stockings Facilities manager) Signed: 02/16/2023 12:58:51 PM By: Yevonne Pax RN Entered By: Yevonne Pax on 02/14/2023 14:34:15 -------------------------------------------------------------------------------- Vitals Details Patient Name: Date of Service: [name] HN H. 02/14/2023 2:30 PM Medical  Record Number: [id number] Patient Account Number: [id number] Date of Birth/Sex: Treating RN: November 24, 1950 (72 y.o. [name] Primary Care Jehan Bonano: Kerby Nora Other Clinician: Referring Alicha Raspberry: Treating Quantavis Obryant/Extender: [name], [name] in Treatment: 2 Vital Signs Time Taken: 14:27 Temperature (F): 97.9 Height (in): 66 Pulse (bpm): [address] H ([id number]) [id number] Page 13 of 13 Weight (lbs): 250 Respiratory Rate (breaths/min): 18 Body Mass Index (BMI): 40.3 Blood Pressure (mmHg): 116/66 Reference Range: 80 - 120 mg / dl Electronic Signature(s) Signed: 02/16/2023 12:58:51 PM By: Yevonne Pax RN Entered By: Yevonne Pax on 02/14/2023 14:28:20

## 2023-02-17 ENCOUNTER — Ambulatory Visit: Payer: HMO | Admitting: Family Medicine

## 2023-02-20 ENCOUNTER — Encounter: Payer: Self-pay | Admitting: Pharmacist

## 2023-02-20 ENCOUNTER — Telehealth (HOSPITAL_COMMUNITY): Payer: Self-pay

## 2023-02-20 NOTE — Progress Notes (Signed)
[facility] Quality Pharmacy Team Statin Quality Measure Assessment  02/20/2023  HADDON FYFE [birth year] [id number]  Per review of chart and payor information, patient has a diagnosis of diabetes and cardiovascular disease but is not currently filling a statin prescription.  This places patient into the Statin Use In Patients with Diabetes (SUPD) and Statin Use in Patients with Cardiovascular Disease (SPC) measures for CMS.    Patient has documented trials of atorvastatin and rosuvastatin with reported body aches, patient will need to be accessed for prior statin intolerance and statin exclusion coding associated for the 2024 year.      Component Value Date/Time   CHOL 107 11/02/2020 0842   TRIG 82.0 11/02/2020 0842   HDL 34.30 (L) 11/02/2020 0842   CHOLHDL 3 11/02/2020 0842   VLDL 16.4 11/02/2020 0842   LDLCALC 56 11/02/2020 0842   LDLDIRECT 174.0 10/19/2017 1600    Please consider ONE of the following recommendations:  Initiate high intensity statin Atorvastatin 40 mg once daily, #90, 3 refills   Rosuvastatin 20 mg once daily, #90, 3 refills    Initiate moderate intensity  statin with reduced frequency if prior  statin intolerance 1x weekly, #13, 3 refills   2x weekly, #26, 3 refills   3x weekly, #39, 3 refills    Code for past statin intolerance  (required annually)  Provider Requirements: Must associate code during an office visit or telehealth encounter   Drug Induced Myopathy G72.0   Myositis, unspecified M60.9   Myopathy, unspecified G72.9   Rhabdomyolysis  M62.82   Myalgia (SPC ONLY) M79.1   Alcoholic cirrhosis of liver without ascites K70.30   Alcoholic cirrhosis of liver with ascites K70.31   Unspecified cirrhosis of liver K74.60   Toxic liver disease with fibrosis and cirrhosis of liver K71.7   Thank you for allowing [facility] pharmacy to be a part of this patient's care.   Reynold Bowen, PharmD Clinical Pharmacist [facility] Direct Dial: [phone]

## 2023-02-20 NOTE — Telephone Encounter (Signed)
I spoke to pt today regarding visit this week but he has 3 doctors appointments this week already, so I will f/u with him next week.   Kerry Hough, EMT-Paramedic  [phone] 02/20/2023

## 2023-02-20 NOTE — Progress Notes (Signed)
GIAVONNI, FONDER ([id number]) [id number] Page 1 of 12 Visit Report for 01/31/2023 Allergy List Details Patient Name: Date of Service: [name] [facility] H. 01/31/2023 2:00 PM Medical Record Number: [id number] Patient Account Number: [id number] Date of Birth/Sex: Treating RN: 11-Sep-1950 (72 y.o. [name] Primary Care Darrin Apodaca: Kerby Nora Other Clinician: Referring Randell Teare: Treating Alleyne Lac/Extender: [name], [name] in Treatment: 0 Allergies Active Allergies No Known Allergies Allergy Notes Electronic Signature(s) Signed: 01/31/2023 4:45:25 PM By: Midge Aver MSN RN CNS WTA Entered By: Midge Aver on 01/31/2023 14:25:00 -------------------------------------------------------------------------------- Arrival Information Details Patient Name: Date of Service: [name] [facility] H. 01/31/2023 2:00 PM Medical Record Number: [id number] Patient Account Number: [id number] Date of Birth/Sex: Treating RN: Sep 22, 1950 (72 y.o. [name] Primary Care Randa Riss: Kerby Nora Other Clinician: Referring Baraa Tubbs: Treating Elleigh Cassetta/Extender: [name], [name] in Treatment: 0 Visit Information Patient Arrived: [name] Arrival Time: 14:12 Accompanied By: self Transfer Assistance: None Patient Identification Verified: Yes Secondary Verification Process Completed: Yes Patient Requires Transmission-Based Precautions: No Patient Has Alerts: Yes Patient Alerts: Patient on Blood Thinner Diabetes type 2 Eliquis/Plavix Bilateral PAD 01/31/23 Electronic Signature(s) ESTES, LEHNER ([id number]) [id number] Page 2 of 12 Signed: 01/31/2023 4:45:25 PM By: Midge Aver MSN RN CNS WTA Entered By: Midge Aver on 01/31/2023 14:58:55 -------------------------------------------------------------------------------- Clinic Level of Care Assessment Details Patient Name: Date of Service: [name] [facility] H. 01/31/2023 2:00 PM Medical Record  Number: [id number] Patient Account Number: [id number] Date of Birth/Sex: Treating RN: May 13, 1951 (72 y.o. [name] Primary Care Zakariah Dejarnette: Kerby Nora Other Clinician: Referring Jaszmine Navejas: Treating Etheridge Geil/Extender: [name], [name] in Treatment: 0 Clinic Level of Care Assessment Items TOOL 2 Quantity Score X- 1 0 Use when only an EandM is performed on the INITIAL visit ASSESSMENTS - Nursing Assessment / Reassessment X- 1 20 General Physical Exam (combine w/ comprehensive assessment (listed just below) when performed on new pt. evals) X- 1 25 Comprehensive Assessment (HX, ROS, Risk Assessments, Wounds Hx, etc.) ASSESSMENTS - Wound and Skin A ssessment / Reassessment []  - 0 Simple Wound Assessment / Reassessment - one wound X- 3 5 Complex Wound Assessment / Reassessment - multiple wounds []  - 0 Dermatologic / Skin Assessment (not related to wound area) ASSESSMENTS - Ostomy and/or Continence Assessment and Care []  - 0 Incontinence Assessment and Management []  - 0 Ostomy Care Assessment and Management (repouching, etc.) PROCESS - Coordination of Care []  - 0 Simple Patient / Family Education for ongoing care X- 1 20 Complex (extensive) Patient / Family Education for ongoing care X- 1 10 Staff obtains Chiropractor, Records, T Results / Process Orders est []  - 0 Staff telephones HHA, Nursing Homes / Clarify orders / etc []  - 0 Routine Transfer to another Facility (non-emergent condition) []  - 0 Routine Hospital Admission (non-emergent condition) X- 1 15 New Admissions / Manufacturing engineer / Ordering NPWT Apligraf, etc. , []  - 0 Emergency Hospital Admission (emergent condition) []  - 0 Simple Discharge Coordination X- 1 15 Complex (extensive) Discharge Coordination PROCESS - Special Needs []  - 0 Pediatric / Minor Patient Management []  - 0 Isolation Patient Management []  - 0 Hearing / Language / Visual special needs []  - 0 Assessment of  Community assistance (transportation, D/C planning, etc.) []  - 0 Additional assistance / Altered mentation [name], [name] ([id number]) [id number] Page 3 of 12 []  - 0 Support Surface(s) Assessment (bed, cushion, seat, etc.) INTERVENTIONS - Wound Cleansing / Measurement X- 1 5 Wound Imaging (photographs - any  number of wounds) []  - 0 Wound Tracing (instead of photographs) []  - 0 Simple Wound Measurement - one wound X- 3 5 Complex Wound Measurement - multiple wounds []  - 0 Simple Wound Cleansing - one wound X- 3 5 Complex Wound Cleansing - multiple wounds INTERVENTIONS - Wound Dressings X - Small Wound Dressing one or multiple wounds 3 10 []  - 0 Medium Wound Dressing one or multiple wounds []  - 0 Large Wound Dressing one or multiple wounds []  - 0 Application of Medications - injection INTERVENTIONS - Miscellaneous []  - 0 External ear exam []  - 0 Specimen Collection (cultures, biopsies, blood, body fluids, etc.) []  - 0 Specimen(s) / Culture(s) sent or taken to Lab for analysis []  - 0 Patient Transfer (multiple staff / Nurse, adult / Similar devices) []  - 0 Simple Staple / Suture removal (25 or less) []  - 0 Complex Staple / Suture removal (26 or more) []  - 0 Hypo / Hyperglycemic Management (close monitor of Blood Glucose) X- 1 15 Ankle / Brachial Index (ABI) - do not check if billed separately Has the patient been seen at the hospital within the last three years: Yes Total Score: 200 Level Of Care: New/Established - Level 5 Electronic Signature(s) Signed: 01/31/2023 4:45:25 PM By: Midge Aver MSN RN CNS WTA Entered By: Midge Aver on 01/31/2023 15:45:16 -------------------------------------------------------------------------------- Encounter Discharge Information Details Patient Name: Date of Service: [name] [facility] H. 01/31/2023 2:00 PM Medical Record Number: [id number] Patient Account Number: [id number] Date of Birth/Sex: Treating  RN: [birth year] (72 y.o. [name] Primary Care [name]: Kerby Nora Other Clinician: Referring [name]: Treating [name]/Extender: [name], [name] in Treatment: 0 Encounter Discharge Information Items Post Procedure Vitals Discharge Condition: Stable Temperature (F): 98.0 Ambulatory Status: Cane Pulse (bpm): 81 Discharge Destination: Home Respiratory Rate (breaths/min): 16 Transportation: Private Auto Blood Pressure (mmHg): 116/69 Accompanied By: self Schedule Follow-up Appointment: Yes Clinical Summary of Care: [name], [name] ([id number]) [id number] Page 4 of 12 Electronic Signature(s) Signed: 02/01/2023 2:10:38 PM By: Midge Aver MSN RN CNS WTA Previous Signature: 01/31/2023 4:45:25 PM Version By: Midge Aver MSN RN CNS WTA Entered By: Midge Aver on 02/01/2023 14:10:38 -------------------------------------------------------------------------------- Lower Extremity Assessment Details Patient Name: Date of Service: [name] [facility] H. 01/31/2023 2:00 PM Medical Record Number: [id number] Patient Account Number: [id number] Date of Birth/Sex: Treating RN: Dec 25, 1950 (72 y.o. [name] Primary Care [name]: Kerby Nora Other Clinician: Referring Nilan Iddings: Treating Jeanine Caven/Extender: [name], [name] in Treatment: 0 Edema Assessment Assessed: [Left: No] [Right: No] [Left: Edema] [Right: :] Calf Left: Right: Point of Measurement: 35 cm From Medial Instep 40 cm Ankle Left: Right: Point of Measurement: 15 cm From Medial Instep 24.6 cm Knee To Floor Left: Right: From Medial Instep 40 cm Vascular Assessment Pulses: Dorsalis Pedis Palpable: [Right:Yes] Electronic Signature(s) Signed: 01/31/2023 4:45:25 PM By: Midge Aver MSN RN CNS WTA Entered By: Midge Aver on 01/31/2023 14:58:29 -------------------------------------------------------------------------------- Multi Wound Chart Details Patient Name: Date  of Service: [name] [facility] H. 01/31/2023 2:00 PM [name] ([id number]) [id number] Page 5 of 12 Medical Record Number: [id number] Patient Account Number: [id number] Date of Birth/Sex: Treating RN: Sep 07, 1950 (72 y.o. [name] Primary Care Vita Currin: Kerby Nora Other Clinician: Referring [name]: Treating Henri Baumler/Extender: [name], [name] in Treatment: 0 Vital Signs Height(in): 66 Pulse(bpm): 81 Weight(lbs): 250 Blood Pressure(mmHg): 115/69 Body Mass Index(BMI): 40.3 Temperature(F): 98.0 Respiratory Rate(breaths/min): 16 [1:Photos:] Right Calcaneus Dorsal, Anterior Foot Right, Lateral Foot Wound Location: Gradually  Appeared Gradually Appeared Gradually Appeared Wounding Event: Diabetic Wound/Ulcer of the Lower Diabetic Wound/Ulcer of the Lower Diabetic Wound/Ulcer of the Lower Primary Etiology: Extremity Extremity Extremity Congestive Heart Failure, Coronary Congestive Heart Failure, Coronary Congestive Heart Failure, Coronary Comorbid History: Artery Disease, Hypertension, Type II Artery Disease, Hypertension, Type II Artery Disease, Hypertension, Type II Diabetes, Neuropathy Diabetes, Neuropathy Diabetes, Neuropathy 12/10/2022 12/10/2022 12/10/2022 Date Acquired: 0 0 0 Weeks of Treatment: Open Open Open Wound Status: No No No Wound Recurrence: Yes No No Clustered Wound: 3.2x1.7x0.1 1x1.1x0.1 1.6x2x0.1 Measurements L x W x D (cm) 4.273 0.864 2.513 A (cm) : rea 0.427 0.086 0.251 Volume (cm) : Grade 1 Grade 1 Grade 1 Classification: Medium Medium Medium Exudate A mount: Serosanguineous Serosanguineous Serosanguineous Exudate Type: red, brown red, brown red, brown Exudate Color: Medium (34-66%) None Present (0%) Small (1-33%) Granulation A mount: Red N/A Red Granulation Quality: None Present (0%) None Present (0%) None Present (0%) Necrotic A mount: Fascia: No Fascia: No Fascia: No Exposed Structures: Fat  Layer (Subcutaneous Tissue): No Fat Layer (Subcutaneous Tissue): No Fat Layer (Subcutaneous Tissue): No Tendon: No Tendon: No Tendon: No Muscle: No Muscle: No Muscle: No Joint: No Joint: No Joint: No Bone: No Bone: No Bone: No None Small (1-33%) Small (1-33%) Epithelialization: Treatment Notes Electronic Signature(s) Signed: 01/31/2023 4:45:25 PM By: Midge Aver MSN RN CNS WTA Entered By: Midge Aver on 01/31/2023 15:16:09 -------------------------------------------------------------------------------- Multi-Disciplinary Care Plan Details Patient Name: Date of Service: [name] [facility] H. 01/31/2023 2:00 PM [name] ([id number]) [id number] Page 6 of 12 Medical Record Number: [id number] Patient Account Number: [id number] Date of Birth/Sex: Treating RN: 09/27/1950 (72 y.o. [name] Primary Care Toben Acuna: Kerby Nora Other Clinician: Referring Ewen Varnell: Treating [name]/Extender: [name], [name] in Treatment: 0 Active Inactive Necrotic Tissue Nursing Diagnoses: Impaired tissue integrity related to necrotic/devitalized tissue Knowledge deficit related to management of necrotic/devitalized tissue Goals: Necrotic/devitalized tissue will be minimized in the wound bed Date Initiated: 02/01/2023 Target Resolution Date: 03/04/2023 Goal Status: Active Patient/caregiver will verbalize understanding of reason and process for debridement of necrotic tissue Date Initiated: 02/01/2023 Target Resolution Date: 03/04/2023 Goal Status: Active Interventions: Assess patient pain level pre-, during and post procedure and prior to discharge Provide education on necrotic tissue and debridement process Treatment Activities: Apply topical anesthetic as ordered : 01/31/2023 Notes: Orientation to the Wound Care Program Nursing Diagnoses: Knowledge deficit related to the wound healing center program Goals: Patient/caregiver will verbalize  understanding of the Wound Healing Center Program Date Initiated: 02/01/2023 Target Resolution Date: 02/16/2023 Goal Status: Active Interventions: Provide education on orientation to the wound center Notes: Pressure Nursing Diagnoses: Knowledge deficit related to causes and risk factors for pressure ulcer development Knowledge deficit related to management of pressures ulcers Potential for impaired tissue integrity related to pressure, friction, moisture, and shear Goals: Patient will remain free from development of additional pressure ulcers Date Initiated: 02/01/2023 Target Resolution Date: 03/04/2023 Goal Status: Active Patient/caregiver will verbalize risk factors for pressure ulcer development Date Initiated: 02/01/2023 Target Resolution Date: 03/04/2023 Goal Status: Active Patient/caregiver will verbalize understanding of pressure ulcer management Date Initiated: 02/01/2023 Target Resolution Date: 03/04/2023 Goal Status: Active Interventions: Assess potential for pressure ulcer upon admission and as needed Provide education on pressure ulcers Treatment Activities: T ordered outside of clinic : 01/31/2023 est Notes: Wound/Skin Impairment Nursing Diagnoses: [name], [name] ([id number]) [id number] Page 7 of 12 Impaired tissue integrity Knowledge deficit related to ulceration/compromised skin integrity Goals: Patient/caregiver will verbalize understanding of skin  care regimen Date Initiated: 02/01/2023 Target Resolution Date: 03/04/2023 Goal Status: Active Ulcer/skin breakdown will have a volume reduction of 30% by week 4 Date Initiated: 02/01/2023 Target Resolution Date: 03/04/2023 Goal Status: Active Ulcer/skin breakdown will have a volume reduction of 50% by week 8 Date Initiated: 02/01/2023 Target Resolution Date: 04/04/2023 Goal Status: Active Ulcer/skin breakdown will have a volume reduction of 80% by week 12 Date Initiated: 02/01/2023 Target Resolution Date:  05/05/2023 Goal Status: Active Interventions: Assess patient/caregiver ability to obtain necessary supplies Assess patient/caregiver ability to perform ulcer/skin care regimen upon admission and as needed Assess ulceration(s) every visit Provide education on ulcer and skin care Treatment Activities: Referred to DME Rika Daughdrill for dressing supplies : 01/31/2023 Skin care regimen initiated : 01/31/2023 Notes: Electronic Signature(s) Signed: 02/01/2023 2:09:27 PM By: Midge Aver MSN RN CNS WTA Entered By: Midge Aver on 02/01/2023 14:09:27 -------------------------------------------------------------------------------- Pain Assessment Details Patient Name: Date of Service: [name] HN H. 01/31/2023 2:00 PM Medical Record Number: [id number] Patient Account Number: [id number] Date of Birth/Sex: Treating RN: 09-30-1950 (72 y.o. [name] Primary Care Arris Meyn: Kerby Nora Other Clinician: Referring Jaice Digioia: Treating [name]/Extender: [name], [name] in Treatment: 0 Active Problems Location of Pain Severity and Description of Pain Patient Has Paino No Site Locations [name], [name] H ([id number]) [id number] Page 8 of 12 Pain Management and Medication Current Pain Management: Electronic Signature(s) Signed: 01/31/2023 4:45:25 PM By: Midge Aver MSN RN CNS WTA Entered By: Midge Aver on 01/31/2023 14:24:10 -------------------------------------------------------------------------------- Patient/Caregiver Education Details Patient Name: Date of Service: [name] 6/4/2024andnbsp2:00 PM Medical Record Number: [id number] Patient Account Number: [id number] Date of Birth/Gender: Treating RN: Nov 02, 1950 (72 y.o. [name] Primary Care Physician: Kerby Nora Other Clinician: Referring Physician: Treating Physician/Extender: [name], [name] in Treatment: 0 Education Assessment Education Provided To: Patient Education  Topics Provided Welcome T [facility]-New Patient Packet: o Handouts: Welcome T [facility] o Methods: Explain/Verbal Responses: State content correctly Wound/Skin Impairment: Handouts: Caring for Your Ulcer Methods: Explain/Verbal Responses: State content correctly Electronic Signature(s) Signed: 02/01/2023 5:26:05 PM By: Midge Aver MSN RN CNS WTA Entered By: Midge Aver on 02/01/2023 14:09:57 [name] ([id number]) [id number] Page 9 of 12 -------------------------------------------------------------------------------- Wound Assessment Details Patient Name: Date of Service: [name] [facility] H. 01/31/2023 2:00 PM Medical Record Number: [id number] Patient Account Number: [id number] Date of Birth/Sex: Treating RN: Feb 16, 1951 (72 y.o. [name] Primary Care Jiovany Scheffel: Kerby Nora Other Clinician: Referring Natlie Asfour: Treating Amala Petion/Extender: [name], [name] in Treatment: 0 Wound Status Wound Number: 1 Primary Diabetic Wound/Ulcer of the Lower Extremity Etiology: Wound Location: Right Calcaneus Wound Open Wounding Event: Gradually Appeared Status: Date Acquired: 12/10/2022 Comorbid Congestive Heart Failure, Coronary Artery Disease, Weeks Of Treatment: 0 History: Hypertension, Type II Diabetes, Neuropathy Clustered Wound: Yes Photos Wound Measurements Length: (cm) 3.2 Width: (cm) 1.7 Depth: (cm) 0.1 Area: (cm) 4.273 Volume: (cm) 0.427 % Reduction in Area: % Reduction in Volume: Epithelialization: None Wound Description Classification: Grade 1 Exudate Amount: Medium Exudate Type: Serosanguineous Exudate Color: red, brown Foul Odor After Cleansing: No Slough/Fibrino No Wound Bed Granulation Amount: Medium (34-66%) Exposed Structure Granulation Quality: Red Fascia Exposed: No Necrotic Amount: None Present (0%) Fat Layer (Subcutaneous Tissue) Exposed: Yes Tendon Exposed: No Muscle Exposed:  No Joint Exposed: No Bone Exposed: No Electronic Signature(s) Signed: 01/31/2023 4:45:25 PM By: Midge Aver MSN RN CNS WTA Entered By: Midge Aver on 01/31/2023 15:39:41 [name] ([id number]) [id number]  Page 10 of 12 -------------------------------------------------------------------------------- Wound Assessment Details Patient Name: Date of Service: [name] [facility] H. 01/31/2023 2:00 PM Medical Record Number: [id number] Patient Account Number: [id number] Date of Birth/Sex: Treating RN: February 15, 1951 (72 y.o. [name] Primary Care Kaneshia Cater: Kerby Nora Other Clinician: Referring Kenneith Stief: Treating Anayia Eugene/Extender: [name], [name] in Treatment: 0 Wound Status Wound Number: 2 Primary Diabetic Wound/Ulcer of the Lower Extremity Etiology: Wound Location: Right, Distal, Dorsal Foot Wound Open Wounding Event: Gradually Appeared Status: Date Acquired: 12/10/2022 Comorbid Congestive Heart Failure, Coronary Artery Disease, Weeks Of Treatment: 0 History: Hypertension, Type II Diabetes, Neuropathy Clustered Wound: No Photos Wound Measurements Length: (cm) 1 Width: (cm) 1.1 Depth: (cm) 0.1 Area: (cm) 0.864 Volume: (cm) 0.086 % Reduction in Area: % Reduction in Volume: Epithelialization: Small (1-33%) Tunneling: No Undermining: No Wound Description Classification: Grade 1 Exudate Amount: Medium Exudate Type: Serosanguineous Exudate Color: red, brown Foul Odor After Cleansing: No Slough/Fibrino No Wound Bed Granulation Amount: None Present (0%) Exposed Structure Necrotic Amount: None Present (0%) Fascia Exposed: No Fat Layer (Subcutaneous Tissue) Exposed: Yes Tendon Exposed: No Muscle Exposed: No Joint Exposed: No Bone Exposed: No Electronic Signature(s) Signed: 02/20/2023 4:11:39 PM By: Midge Aver MSN RN CNS WTA Previous Signature: 01/31/2023 4:45:25 PM Version By: Midge Aver MSN RN CNS WTA Entered By: Midge Aver on  02/07/2023 15:29:01 [name] ([id number]) [id number] Page 11 of 12 -------------------------------------------------------------------------------- Wound Assessment Details Patient Name: Date of Service: [name] [facility] H. 01/31/2023 2:00 PM Medical Record Number: [id number] Patient Account Number: [id number] Date of Birth/Sex: Treating RN: 02/22/1951 (72 y.o. [name] Primary Care Aniesa Boback: Kerby Nora Other Clinician: Referring Tanijah Morais: Treating Gokul Waybright/Extender: [name], [name] in Treatment: 0 Wound Status Wound Number: 3 Primary Diabetic Wound/Ulcer of the Lower Extremity Etiology: Wound Location: Right, Lateral Foot Wound Open Wounding Event: Gradually Appeared Status: Date Acquired: 12/10/2022 Comorbid Congestive Heart Failure, Coronary Artery Disease, Weeks Of Treatment: 0 History: Hypertension, Type II Diabetes, Neuropathy Clustered Wound: No Photos Wound Measurements Length: (cm) 1.6 Width: (cm) 2 Depth: (cm) 0.1 Area: (cm) 2.513 Volume: (cm) 0.251 % Reduction in Area: % Reduction in Volume: Epithelialization: Small (1-33%) Tunneling: No Undermining: No Wound Description Classification: Grade 1 Exudate Amount: Medium Exudate Type: Serosanguineous Exudate Color: red, brown Foul Odor After Cleansing: No Slough/Fibrino No Wound Bed Granulation Amount: Small (1-33%) Exposed Structure Granulation Quality: Red Fascia Exposed: No Necrotic Amount: None Present (0%) Fat Layer (Subcutaneous Tissue) Exposed: Yes Tendon Exposed: No Muscle Exposed: No Joint Exposed: No Bone Exposed: No Electronic Signature(s) Signed: 01/31/2023 4:45:25 PM By: Midge Aver MSN RN CNS WTA Entered By: Midge Aver on 01/31/2023 15:40:05 [name] ([id number]) [id number] Page 12 of 12 -------------------------------------------------------------------------------- Vitals Details Patient Name: Date of  Service: [name] [facility] H. 01/31/2023 2:00 PM Medical Record Number: [id number] Patient Account Number: [id number] Date of Birth/Sex: Treating RN: 10/05/1950 (72 y.o. [name] Primary Care Lashunda Greis: Kerby Nora Other Clinician: Referring Babygirl Trager: Treating Amorah Sebring/Extender: [name], [name] in Treatment: 0 Vital Signs Time Taken: 14:24 Temperature (F): 98.0 Height (in): 66 Pulse (bpm): 81 Source: Stated Respiratory Rate (breaths/min): 16 Weight (lbs): 250 Blood Pressure (mmHg): 115/69 Source: Stated Reference Range: 80 - 120 mg / dl Body Mass Index (BMI): 40.3 Electronic Signature(s) Signed: 01/31/2023 4:45:25 PM By: Midge Aver MSN RN CNS WTA Entered By: Midge Aver on 01/31/2023 14:24:44

## 2023-02-21 ENCOUNTER — Ambulatory Visit: Payer: HMO | Admitting: Family Medicine

## 2023-02-21 ENCOUNTER — Encounter: Payer: PPO | Admitting: Physician Assistant

## 2023-02-21 DIAGNOSIS — L97412 Non-pressure chronic ulcer of right heel and midfoot with fat layer exposed: Secondary | ICD-10-CM | POA: Diagnosis not present

## 2023-02-21 DIAGNOSIS — L97512 Non-pressure chronic ulcer of other part of right foot with fat layer exposed: Secondary | ICD-10-CM | POA: Diagnosis not present

## 2023-02-21 DIAGNOSIS — E11621 Type 2 diabetes mellitus with foot ulcer: Secondary | ICD-10-CM | POA: Diagnosis not present

## 2023-02-21 NOTE — Progress Notes (Addendum)
TRANELL, WOJTKIEWICZ ([id number]) [id number] Page 1 of 7 Visit Report for 02/21/2023 Chief Complaint Document Details Patient Name: Date of Service: [name] [facility] [name]. 02/21/2023 3:30 PM Medical Record Number: [id number] Patient Account Number: [id number] Date of Birth/Sex: Treating RN: [birth year] (72 y.o. [name] Primary Care Provider: Kerby Nora Other Clinician: Referring Provider: Treating Provider/Extender: [name], [name] in Treatment: 3 Information Obtained from: Patient Chief Complaint Right foot ulcers Electronic Signature(s) Signed: 02/21/2023 3:47:10 PM By: Allen Derry PA-C Entered By: Allen Derry on 02/21/2023 15:47:10 -------------------------------------------------------------------------------- HPI Details Patient Name: Date of Service: [name] [name] 02/21/2023 3:30 PM Medical Record Number: [id number] Patient Account Number: [id number] Date of Birth/Sex: Treating RN: 06/29/1951 (72 y.o. [name] Primary Care Provider: Kerby Nora Other Clinician: Referring Provider: Treating Provider/Extender: [name], [name] in Treatment: 3 History of Present Illness HPI Description: 01-31-2023 upon evaluation today patient appears to be doing poorly currently in regard to wounds on his right heel, right dorsal foot, and right lateral foot. Subsequently he does have known peripheral vascular disease and again this is something that he needs to I think Checked formally. This is what the majority of the conversation today hinged around and the patient voiced understanding as far as that is concerned. Fortunately I do not see any signs of active infection locally nor systemically which is great news. Patient does have a history of diabetes mellitus type 2, atrial fibrillation for which she is on long-term anticoagulant Therapy, hypertension, and coronary artery disease. Patient's hemoglobin A1c most recently was on  11-17-2022 and was 7.2 and currently he is on Eliquis and Plavix. 02-07-2023 upon evaluation today patient appears to be doing well currently in regard to his wounds all things considered I feel like we are still maintaining that does not seem like it is any worse is also not significantly better. I discussed with the patient that I do believe he would benefit from the arterial evaluation we still need to get this done as quickly as possible and subsequently we did put in a follow-up call with the vascular office today with regard to this. 02-14-2023 upon evaluation today patient appears to be doing a little better in regard to his wounds which are showing signs of loosening which is great news. With that being said he is experiencing an improvement overall in his symptoms and we are still waiting on the vascular evaluation want to get this result and consider whether or not his blood flow is good or not we will be able to make a better determination of next steps. For now we will try and avoid any aggressive sharp debridement to know that he has good arterial flow. DAMAN, STEFFENHAGEN ([id number]) [id number] Page 2 of 7 02-21-2023 upon evaluation today patient appears to be doing about the same in regard to his wound. Fortunately there does not appear to be any signs of active infection at this time which is great news. No fevers, chills, nausea, vomiting, or diarrhea. I did review patient's arterial study and it appears that he has pretty good flow in the left is not perfect but it is decent. On the right however he is definitely not doing nearly as good and I think that he is going to need to see one of the vascular doctors for further evaluation and treatment of this right leg in order to get these wounds to heal. Electronic Signature(s) Signed: 02/21/2023 4:00:01 PM By: Allen Derry PA-C Previous  Signature: 02/21/2023 3:59:23 PM Version By: Allen Derry PA-C Entered By: Allen Derry on 02/21/2023 16:00:01 -------------------------------------------------------------------------------- Physical Exam Details Patient Name: Date of Service: [name] 02/21/2023 3:30 PM Medical Record Number: [id number] Patient Account Number: [id number] Date of Birth/Sex: Treating RN: 1951/07/25 (72 y.o. [name] Primary Care Provider: Kerby Nora Other Clinician: Referring Provider: Treating Provider/Extender: [name], [name] in Treatment: 3 Constitutional Well-nourished and well-hydrated in no acute distress. Respiratory normal breathing without difficulty. Psychiatric this patient is able to make decisions and demonstrates good insight into disease process. Alert and Oriented x 3. pleasant and cooperative. Notes Patient's wound beds actually still show signs of eschar coverage we do need to get him cleared up but at the same time right now I am avoiding any aggressive debridement due to his arterial flow and overall making things worse. He is in agreement the plan as his daughter who was present during the evaluation today. Electronic Signature(s) Signed: 02/21/2023 4:00:26 PM By: Allen Derry PA-C Entered By: Allen Derry on 02/21/2023 16:00:25 -------------------------------------------------------------------------------- Physician Orders Details Patient Name: Date of Service: [name] [name] 02/21/2023 3:30 PM Medical Record Number: [id number] Patient Account Number: [id number] Date of Birth/Sex: Treating RN: 02-Jan-1951 (72 y.o. [name] Primary Care Provider: Kerby Nora Other Clinician: Referring Provider: Treating Provider/Extender: [name], [name] in Treatment: 3 Verbal / Phone Orders: No [name], [name] ([id number]) [id number] Page 3 of 7 Diagnosis Coding ICD-10 Coding Code Description E11.621 Type 2 diabetes mellitus with foot ulcer L97.512 Non-pressure chronic ulcer of other part  of right foot with fat layer exposed I48.0 Paroxysmal atrial fibrillation Z79.01 Long term (current) use of anticoagulants I10 Essential (primary) hypertension I25.10 Atherosclerotic heart disease of native coronary artery without angina pectoris Follow-up Appointments Return Appointment in 1 week. Bathing/ [employer] wounds with antibacterial soap and water. Anesthetic (Use 'Patient Medications' Section for Anesthetic Order Entry) Lidocaine applied to wound bed Wound Treatment Wound #1 - Calcaneus Wound Laterality: Right Cleanser: Byram Ancillary Kit - 15 Day Supply (Generic) 3 x Per Week/30 Days Discharge Instructions: Use supplies as instructed; Kit contains: (15) Saline Bullets; (15) 3x3 Gauze; 15 pr Gloves Cleanser: Soap and Water 3 x Per Week/30 Days Discharge Instructions: Gently cleanse wound with antibacterial soap, rinse and pat dry prior to dressing wounds Prim Dressing: IODOFLEX 0.9% Cadexomer Iodine Pad (Generic) 3 x Per Week/30 Days ary Discharge Instructions: Apply Iodoflex to wound bed only as directed. Secondary Dressing: (BORDER) Zetuvit Plus SILICONE BORDER Dressing 4x4 (in/in) (Generic) 3 x Per Week/30 Days Discharge Instructions: Please do not put silicone bordered dressings under wraps. Use non-bordered dressing only. Wound #2 - Foot Wound Laterality: Dorsal, Right, Distal Cleanser: Byram Ancillary Kit - 15 Day Supply (Generic) 3 x Per Week/30 Days Discharge Instructions: Use supplies as instructed; Kit contains: (15) Saline Bullets; (15) 3x3 Gauze; 15 pr Gloves Cleanser: Soap and Water 3 x Per Week/30 Days Discharge Instructions: Gently cleanse wound with antibacterial soap, rinse and pat dry prior to dressing wounds Prim Dressing: IODOFLEX 0.9% Cadexomer Iodine Pad (Generic) 3 x Per Week/30 Days ary Discharge Instructions: Apply Iodoflex to wound bed only as directed. Secondary Dressing: (BORDER) Zetuvit Plus SILICONE BORDER Dressing 4x4 (in/in)  (Generic) 3 x Per Week/30 Days Discharge Instructions: Please do not put silicone bordered dressings under wraps. Use non-bordered dressing only. Wound #3 - Foot Wound Laterality: Dorsal, Right, Proximal Cleanser: Soap and Water 3 x Per Week/30 Days Discharge Instructions: Gently  cleanse wound with antibacterial soap, rinse and pat dry prior to dressing wounds Prim Dressing: IODOFLEX 0.9% Cadexomer Iodine Pad (Generic) 3 x Per Week/30 Days ary Discharge Instructions: Apply Iodoflex to wound bed only as directed. Secondary Dressing: (BORDER) Zetuvit Plus SILICONE BORDER Dressing 4x4 (in/in) (Generic) 3 x Per Week/30 Days Discharge Instructions: Please do not put silicone bordered dressings under wraps. Use non-bordered dressing only. Consults Vein Clinic Referral Electronic Signature(s) Signed: 02/21/2023 4:35:57 PM By: Midge Aver MSN RN CNS WTA Signed: 02/21/2023 5:11:00 PM By: Allen Derry PA-C Entered By: Midge Aver on 02/21/2023 16:30:01 [name], [name] ([id number]) [id number] Page 4 of 7 -------------------------------------------------------------------------------- Problem List Details Patient Name: Date of Service: [name] 02/21/2023 3:30 PM Medical Record Number: [id number] Patient Account Number: [id number] Date of Birth/Sex: Treating RN: 1950-10-25 (72 y.o. [name] Primary Care Provider: Kerby Nora Other Clinician: Referring Provider: Treating Provider/Extender: [name], [name] in Treatment: 3 Active Problems ICD-10 Encounter Code Description Active Date MDM Diagnosis E11.621 Type 2 diabetes mellitus with foot ulcer 01/31/2023 No Yes L97.512 Non-pressure chronic ulcer of other part of right foot with fat layer exposed 01/31/2023 No Yes I48.0 Paroxysmal atrial fibrillation 01/31/2023 No Yes Z79.01 Long term (current) use of anticoagulants 01/31/2023 No Yes I10 Essential (primary) hypertension 01/31/2023 No Yes I25.10  Atherosclerotic heart disease of native coronary artery without angina pectoris 01/31/2023 No Yes Inactive Problems Resolved Problems Electronic Signature(s) Signed: 02/21/2023 3:47:07 PM By: Allen Derry PA-C Entered By: Allen Derry on 02/21/2023 15:47:07 Progress Note Details -------------------------------------------------------------------------------- [name] ([id number]) [id number] Page 5 of 7 Patient Name: Date of Service: [name] 02/21/2023 3:30 PM Medical Record Number: [id number] Patient Account Number: [id number] Date of Birth/Sex: Treating RN: 03-17-1951 (72 y.o. [name] Primary Care Provider: Kerby Nora Other Clinician: Referring Provider: Treating Provider/Extender: [name], [name] in Treatment: 3 Subjective Chief Complaint Information obtained from Patient Right foot ulcers History of Present Illness (HPI) 01-31-2023 upon evaluation today patient appears to be doing poorly currently in regard to wounds on his right heel, right dorsal foot, and right lateral foot. Subsequently he does have known peripheral vascular disease and again this is something that he needs to I think Checked formally. This is what the majority of the conversation today hinged around and the patient voiced understanding as far as that is concerned. Fortunately I do not see any signs of active infection locally nor systemically which is great news. Patient does have a history of diabetes mellitus type 2, atrial fibrillation for which she is on long-term anticoagulant Therapy, hypertension, and coronary artery disease. Patient's hemoglobin A1c most recently was on 11-17-2022 and was 7.2 and currently he is on Eliquis and Plavix. 02-07-2023 upon evaluation today patient appears to be doing well currently in regard to his wounds all things considered I feel like we are still maintaining that does not seem like it is any worse is also not  significantly better. I discussed with the patient that I do believe he would benefit from the arterial evaluation we still need to get this done as quickly as possible and subsequently we did put in a follow-up call with the vascular office today with regard to this. 02-14-2023 upon evaluation today patient appears to be doing a little better in regard to his wounds which are showing signs of loosening which is great news. With that being said he is experiencing an improvement overall in his symptoms and we are  still waiting on the vascular evaluation want to get this result and consider whether or not his blood flow is good or not we will be able to make a better determination of next steps. For now we will try and avoid any aggressive sharp debridement to know that he has good arterial flow. 02-21-2023 upon evaluation today patient appears to be doing about the same in regard to his wound. Fortunately there does not appear to be any signs of active infection at this time which is great news. No fevers, chills, nausea, vomiting, or diarrhea. I did review patient's arterial study and it appears that he has pretty good flow in the left is not perfect but it is decent. On the right however he is definitely not doing nearly as good and I think that he is going to need to see one of the vascular doctors for further evaluation and treatment of this right leg in order to get these wounds to heal. Objective Constitutional Well-nourished and well-hydrated in no acute distress. Vitals Time Taken: 3:28 PM, Height: 66 in, Weight: 250 lbs, BMI: 40.3, Temperature: 98.2 F, Pulse: 71 bpm, Respiratory Rate: 18 breaths/min, Blood Pressure: 133/65 mmHg. Respiratory normal breathing without difficulty. Psychiatric this patient is able to make decisions and demonstrates good insight into disease process. Alert and Oriented x 3. pleasant and cooperative. General Notes: Patient's wound beds actually still show signs of  eschar coverage we do need to get him cleared up but at the same time right now I am avoiding any aggressive debridement due to his arterial flow and overall making things worse. He is in agreement the plan as his daughter who was present during the evaluation today. Integumentary (Hair, Skin) Wound #1 status is Open. Original cause of wound was Gradually Appeared. The date acquired was: 12/10/2022. The wound has been in treatment 3 weeks. The wound is located on the Right Calcaneus. The wound measures 2.8cm length x 0.6cm width x 0.2cm depth; 1.319cm^2 area and 0.264cm^3 volume. There is Fat Layer (Subcutaneous Tissue) exposed. There is a medium amount of serosanguineous drainage noted. There is medium (34-66%) red granulation within the wound bed. There is no necrotic tissue within the wound bed. Wound #2 status is Open. Original cause of wound was Gradually Appeared. The date acquired was: 12/10/2022. The wound has been in treatment 3 weeks. The wound is located on the Right,Distal,Dorsal Foot. The wound measures 1cm length x 1cm width x 0.2cm depth; 0.785cm^2 area and 0.157cm^3 volume. There is Fat Layer (Subcutaneous Tissue) exposed. There is a medium amount of serosanguineous drainage noted. There is no granulation within the wound bed. There is no necrotic tissue within the wound bed. Wound #3 status is Open. Original cause of wound was Gradually Appeared. The date acquired was: 12/10/2022. The wound has been in treatment 3 weeks. The wound is located on the Right,Proximal,Dorsal Foot. The wound measures 1.4cm length x 1.7cm width x 0.2cm depth; 1.869cm^2 area and 0.374cm^3 volume. There is Fat Layer (Subcutaneous Tissue) exposed. There is a medium amount of serosanguineous drainage noted. There is no granulation within the wound bed. There is a large (67-100%) amount of necrotic tissue within the wound bed including Adherent Slough. Assessment [name],  ([id number])  [id number] Page 6 of 7 Active Problems ICD-10 Type 2 diabetes mellitus with foot ulcer Non-pressure chronic ulcer of other part of right foot with fat layer exposed Paroxysmal atrial fibrillation Long term (current) use of anticoagulants Essential (primary) hypertension Atherosclerotic heart disease of native  coronary artery without angina pectoris Plan Follow-up Appointments: Return Appointment in 1 week. Bathing/ Shower/ Hygiene: Wash wounds with antibacterial soap and water. Anesthetic (Use 'Patient Medications' Section for Anesthetic Order Entry): Lidocaine applied to wound bed WOUND #1: - Calcaneus Wound Laterality: Right Cleanser: Byram Ancillary Kit - 15 Day Supply (Generic) 3 x Per Week/30 Days Discharge Instructions: Use supplies as instructed; Kit contains: (15) Saline Bullets; (15) 3x3 Gauze; 15 pr Gloves Cleanser: Soap and Water 3 x Per Week/30 Days Discharge Instructions: Gently cleanse wound with antibacterial soap, rinse and pat dry prior to dressing wounds Prim Dressing: IODOFLEX 0.9% Cadexomer Iodine Pad (Generic) 3 x Per Week/30 Days ary Discharge Instructions: Apply Iodoflex to wound bed only as directed. Secondary Dressing: (BORDER) Zetuvit Plus SILICONE BORDER Dressing 4x4 (in/in) (Generic) 3 x Per Week/30 Days Discharge Instructions: Please do not put silicone bordered dressings under wraps. Use non-bordered dressing only. WOUND #2: - Foot Wound Laterality: Dorsal, Right, Distal Cleanser: Byram Ancillary Kit - 15 Day Supply (Generic) 3 x Per Week/30 Days Discharge Instructions: Use supplies as instructed; Kit contains: (15) Saline Bullets; (15) 3x3 Gauze; 15 pr Gloves Cleanser: Soap and Water 3 x Per Week/30 Days Discharge Instructions: Gently cleanse wound with antibacterial soap, rinse and pat dry prior to dressing wounds Prim Dressing: IODOFLEX 0.9% Cadexomer Iodine Pad (Generic) 3 x Per Week/30 Days ary Discharge Instructions:  Apply Iodoflex to wound bed only as directed. Secondary Dressing: (BORDER) Zetuvit Plus SILICONE BORDER Dressing 4x4 (in/in) (Generic) 3 x Per Week/30 Days Discharge Instructions: Please do not put silicone bordered dressings under wraps. Use non-bordered dressing only. WOUND #3: - Foot Wound Laterality: Dorsal, Right, Proximal Cleanser: Soap and Water 3 x Per Week/30 Days Discharge Instructions: Gently cleanse wound with antibacterial soap, rinse and pat dry prior to dressing wounds Prim Dressing: IODOFLEX 0.9% Cadexomer Iodine Pad (Generic) 3 x Per Week/30 Days ary Discharge Instructions: Apply Iodoflex to wound bed only as directed. Secondary Dressing: (BORDER) Zetuvit Plus SILICONE BORDER Dressing 4x4 (in/in) (Generic) 3 x Per Week/30 Days Discharge Instructions: Please do not put silicone bordered dressings under wraps. Use non-bordered dressing only. 1. I would recommend currently that we have the patient continue to monitor for any signs of infection or worsening. Based on what I am seeing I do believe that we are making good progress towards closure. 2. I would recommend as well that the patient should continue with the Iodoflex along with a bordered foam dressing to all wound locations. 3. Based on his poor arterial flow we will also get a make referral to vascular for evaluation and treatment of the arterial insufficiency of the right lower extremity he is in agreement with this plan. We will see patient back for reevaluation in 1 week here in the clinic. If anything worsens or changes patient will contact our office for additional recommendations. Electronic Signature(s) Signed: 02/21/2023 4:00:59 PM By: Allen Derry PA-C Entered By: Allen Derry on 02/21/2023 16:00:59 -------------------------------------------------------------------------------- SuperBill Details Patient Name: Date of Service: [name] [name] 02/21/2023 Medical Record Number: [id number] Patient Account Number:  [id number] Date of Birth/Sex: Treating RN: Apr 18, 1951 (72 y.o. [name], [name], [name] ([id number]) [id number] Page 7 of 7 Primary Care Provider: Kerby Nora Other Clinician: Referring Provider: Treating Provider/Extender: [name], [name] in Treatment: 3 Diagnosis Coding ICD-10 Codes Code Description E11.621 Type 2 diabetes mellitus with foot ulcer L97.512 Non-pressure chronic ulcer of other part of right foot with fat layer exposed I48.0 Paroxysmal atrial fibrillation  Z79.01 Long term (current) use of anticoagulants I10 Essential (primary) hypertension I25.10 Atherosclerotic heart disease of native coronary artery without angina pectoris Facility Procedures : CPT4 Code: [id number] Description: 99214 - WOUND CARE VISIT-LEV 4 EST PT Modifier: Quantity: 1 Physician Procedures : CPT4 Code Description Modifier [id number] - WC PHYS LEVEL 3 - EST PT ICD-10 Diagnosis Description E11.621 Type 2 diabetes mellitus with foot ulcer L97.512 Non-pressure chronic ulcer of other part of right foot with fat layer exposed I48.0 Paroxysmal  atrial fibrillation Z79.01 Long term (current) use of anticoagulants Quantity: 1 Electronic Signature(s) Signed: 02/21/2023 4:01:14 PM By: Allen Derry PA-C Entered By: Allen Derry on 02/21/2023 16:01:13

## 2023-02-21 NOTE — Progress Notes (Addendum)
[name], [name] ([id number]) [id number] Page 1 of 12 Visit Report for 02/21/2023 Arrival Information Details Patient Name: Date of Service: [name] [facility] H. 02/21/2023 3:30 PM Medical Record Number: [id number] Patient Account Number: [id number] Date of Birth/Sex: Treating RN: 1951/06/21 (72 y.o. [name] Primary Care Anum Palecek: Kerby Nora Other Clinician: Referring Akasia Ahmad: Treating Shaylon Aden/Extender: [name], [name] in Treatment: 3 Visit Information History Since Last Visit Added or deleted any medications: No Patient Arrived: Cane Has Dressing in Place as Prescribed: Yes Arrival Time: 15:24 Pain Present Now: No Accompanied By: daughter Transfer Assistance: [name] Patient Identification Verified: Yes Secondary Verification Process Completed: Yes Patient Requires Transmission-Based Precautions: No Patient Has Alerts: Yes Patient Alerts: Patient on Blood Thinner Diabetes type 2 Eliquis/Plavix ABI R 1.23 TBI 0.57 ABI L 0.98 TBI 0.80 Electronic Signature(s) Signed: 02/21/2023 4:35:57 PM By: Midge Aver MSN RN CNS WTA Previous Signature: 02/21/2023 10:41:54 AM Version By: Midge Aver MSN RN CNS WTA Previous Signature: 02/21/2023 10:41:21 AM Version By: Midge Aver MSN RN CNS WTA Entered By: Midge Aver on 02/21/2023 15:51:25 -------------------------------------------------------------------------------- Clinic Level of Care Assessment Details Patient Name: Date of Service: [name] [facility] H. 02/21/2023 3:30 PM Medical Record Number: [id number] Patient Account Number: [id number] Date of Birth/Sex: Treating RN: 14-Feb-1951 (72 y.o. [name] Primary Care Shevaun Lovan: Kerby Nora Other Clinician: Referring Nadelyn Enriques: Treating Gaelyn Tukes/Extender: [name], [name] in Treatment: 3 Clinic Level of Care Assessment Items TOOL 4 Quantity Score X- 1 0 Use when only an EandM is performed on FOLLOW-UP visit ASSESSMENTS -  Nursing Assessment / Reassessment X- 1 10 Reassessment of Co-morbidities (includes updates in patient status) [name], [name] ([id number]) [id number] Page 2 of 12 X- 1 5 Reassessment of Adherence to Treatment Plan ASSESSMENTS - Wound and Skin A ssessment / Reassessment []  - 0 Simple Wound Assessment / Reassessment - one wound X- 3 5 Complex Wound Assessment / Reassessment - multiple wounds []  - 0 Dermatologic / Skin Assessment (not related to wound area) ASSESSMENTS - Focused Assessment []  - 0 Circumferential Edema Measurements - multi extremities []  - 0 Nutritional Assessment / Counseling / Intervention []  - 0 Lower Extremity Assessment (monofilament, tuning fork, pulses) []  - 0 Peripheral Arterial Disease Assessment (using hand held doppler) ASSESSMENTS - Ostomy and/or Continence Assessment and Care []  - 0 Incontinence Assessment and Management []  - 0 Ostomy Care Assessment and Management (repouching, etc.) PROCESS - Coordination of Care []  - 0 Simple Patient / Family Education for ongoing care X- 1 20 Complex (extensive) Patient / Family Education for ongoing care X- 1 10 Staff obtains Chiropractor, Records, T Results / Process Orders est []  - 0 Staff telephones HHA, Nursing Homes / Clarify orders / etc []  - 0 Routine Transfer to another Facility (non-emergent condition) []  - 0 Routine Hospital Admission (non-emergent condition) []  - 0 New Admissions / Manufacturing engineer / Ordering NPWT Apligraf, etc. , []  - 0 Emergency Hospital Admission (emergent condition) []  - 0 Simple Discharge Coordination X- 1 15 Complex (extensive) Discharge Coordination PROCESS - Special Needs []  - 0 Pediatric / Minor Patient Management []  - 0 Isolation Patient Management []  - 0 Hearing / Language / Visual special needs []  - 0 Assessment of Community assistance (transportation, D/C planning, etc.) []  - 0 Additional assistance / Altered mentation []  -  0 Support Surface(s) Assessment (bed, cushion, seat, etc.) INTERVENTIONS - Wound Cleansing / Measurement []  - 0 Simple Wound Cleansing - one wound X- 3 5 Complex Wound Cleansing -  multiple wounds X- 1 5 Wound Imaging (photographs - any number of wounds) []  - 0 Wound Tracing (instead of photographs) []  - 0 Simple Wound Measurement - one wound X- 3 5 Complex Wound Measurement - multiple wounds INTERVENTIONS - Wound Dressings X - Small Wound Dressing one or multiple wounds 3 10 []  - 0 Medium Wound Dressing one or multiple wounds []  - 0 Large Wound Dressing one or multiple wounds X- 1 5 Application of Medications - topical []  - 0 Application of Medications - injection INTERVENTIONS - Miscellaneous [name], [name] ([id number]) [id number] Page 3 of 12 []  - 0 External ear exam []  - 0 Specimen Collection (cultures, biopsies, blood, body fluids, etc.) []  - 0 Specimen(s) / Culture(s) sent or taken to Lab for analysis []  - 0 Patient Transfer (multiple staff / Michiel Sites Lift / Similar devices) []  - 0 Simple Staple / Suture removal (25 or less) []  - 0 Complex Staple / Suture removal (26 or more) []  - 0 Hypo / Hyperglycemic Management (close monitor of Blood Glucose) []  - 0 Ankle / Brachial Index (ABI) - do not check if billed separately X- 1 5 Vital Signs Has the patient been seen at the hospital within the last three years: Yes Total Score: 150 Level Of Care: New/Established - Level 4 Electronic Signature(s) Signed: 02/21/2023 4:35:57 PM By: Midge Aver MSN RN CNS WTA Entered By: Midge Aver on 02/21/2023 15:55:53 -------------------------------------------------------------------------------- Encounter Discharge Information Details Patient Name: Date of Service: [name] HN H. 02/21/2023 3:30 PM Medical Record Number: [id number] Patient Account Number: [id number] Date of Birth/Sex: Treating RN: 02-24-1951 (72 y.o. [name] Primary Care  Shira Bobst: Kerby Nora Other Clinician: Referring Karmella Bouvier: Treating Renan Danese/Extender: [name], [name] in Treatment: 3 Encounter Discharge Information Items Discharge Condition: Stable Ambulatory Status: Cane Discharge Destination: Home Transportation: Private Auto Accompanied By: daughter Schedule Follow-up Appointment: Yes Clinical Summary of Care: Electronic Signature(s) Signed: 02/21/2023 4:35:57 PM By: Midge Aver MSN RN CNS WTA Entered By: Midge Aver on 02/21/2023 15:58:14 [name] ([id number]) [id number] Page 4 of 12 -------------------------------------------------------------------------------- Lower Extremity Assessment Details Patient Name: Date of Service: [name] 02/21/2023 3:30 PM Medical Record Number: [id number] Patient Account Number: [id number] Date of Birth/Sex: Treating RN: Nov 30, 1950 (72 y.o. [name] Primary Care Yasaman Kolek: Kerby Nora Other Clinician: Referring Anzleigh Slaven: Treating Neziah Vogelgesang/Extender: [name], [name] in Treatment: 3 Edema Assessment Assessed: [Left: No] [Right: No] [Left: Edema] [Right: :] Calf Left: Right: Point of Measurement: 36 cm From Medial Instep 40 cm Ankle Left: Right: Point of Measurement: 18 cm From Medial Instep 24 cm Knee To Floor Left: Right: From Medial Instep 40 cm Vascular Assessment Pulses: Dorsalis Pedis Palpable: [Right:Yes] Electronic Signature(s) Signed: 02/21/2023 4:35:57 PM By: Midge Aver MSN RN CNS WTA Entered By: Midge Aver on 02/21/2023 15:45:27 -------------------------------------------------------------------------------- Multi Wound Chart Details Patient Name: Date of Service: [name] HN H. 02/21/2023 3:30 PM Medical Record Number: [id number] Patient Account Number: [id number] Date of Birth/Sex: Treating RN: 16-Apr-1951 (72 y.o. [name] Primary Care Terrika Zuver: Kerby Nora Other Clinician: Referring  Quadre Bristol: Treating Jonell Brumbaugh/Extender: [name], [name] in Treatment: 3 Vital Signs Height(in): 66 Pulse(bpm): 71 Weight(lbs): 250 Blood Pressure(mmHg): 133/65 Body Mass Index(BMI): 40.3 Temperature(F): 98.2 Respiratory Rate(breaths/min): 18 [1:Photos:] [phone].pdf Page 5 of 12] Right Calcaneus Right, Distal, Dorsal Foot Right, Proximal, Dorsal Foot Wound Location: Gradually Appeared Gradually Appeared Gradually Appeared Wounding Event: Diabetic Wound/Ulcer of the Lower Diabetic Wound/Ulcer of the Lower Diabetic  Wound/Ulcer of the Lower Primary Etiology: Extremity Extremity Extremity Congestive Heart Failure, Coronary Congestive Heart Failure, Coronary Congestive Heart Failure, Coronary Comorbid History: Artery Disease, Hypertension, Type II Artery Disease, Hypertension, Type II Artery Disease, Hypertension, Type II Diabetes, Neuropathy Diabetes, Neuropathy Diabetes, Neuropathy 12/10/2022 12/10/2022 12/10/2022 Date Acquired: 3 3 3  Weeks of Treatment: Open Open Open Wound Status: No No No Wound Recurrence: Yes No No Clustered Wound: 2.8x0.6x0.2 1x1x0.2 1.4x1.7x0.2 Measurements L x W x D (cm) 1.319 0.785 1.869 A (cm) : rea 0.264 0.157 0.374 Volume (cm) : 69.10% 9.10% 25.60% % Reduction in A rea: 38.20% -82.60% -49.00% % Reduction in Volume: Grade 1 Grade 1 Grade 1 Classification: Medium Medium Medium Exudate A mount: Serosanguineous Serosanguineous Serosanguineous Exudate Type: red, brown red, brown red, brown Exudate Color: Medium (34-66%) [name] Present (0%) [name] Present (0%) Granulation A mount: Red N/A N/A Granulation Quality: [name] Present (0%) [name] Present (0%) Large (67-100%) Necrotic A mount: Fat Layer (Subcutaneous Tissue): Yes Fat Layer (Subcutaneous Tissue): Yes Fat Layer (Subcutaneous Tissue): Yes Exposed Structures: Fascia: No Fascia: No Fascia: No Tendon: No Tendon: No Tendon: No Muscle: No Muscle:  No Muscle: No Joint: No Joint: No Joint: No Bone: No Bone: No Bone: No [name] Small (1-33%) Small (1-33%) Epithelialization: Treatment Notes Electronic Signature(s) Signed: 02/21/2023 4:35:57 PM By: Midge Aver MSN RN CNS WTA Entered By: Midge Aver on 02/21/2023 15:51:33 -------------------------------------------------------------------------------- Multi-Disciplinary Care Plan Details Patient Name: Date of Service: [name] HN H. 02/21/2023 3:30 PM Medical Record Number: [id number] Patient Account Number: [id number] Date of Birth/Sex: Treating RN: Apr 07, 1951 (72 y.o. [name] Primary Care Briannah Lona: Kerby Nora Other Clinician: Referring Lanae Federer: Treating Oria Klimas/Extender: [name], [name] in Treatment: 3 Active Inactive Necrotic Tissue Nursing Diagnoses: Impaired tissue integrity related to necrotic/devitalized tissue Knowledge deficit related to management of necrotic/devitalized tissue Goals: Necrotic/devitalized tissue will be minimized in the wound bed [name], [name] ([id number]) [id number] Page 6 of 12 Date Initiated: 02/01/2023 Target Resolution Date: 03/04/2023 Goal Status: Active Patient/caregiver will verbalize understanding of reason and process for debridement of necrotic tissue Date Initiated: 02/01/2023 Target Resolution Date: 03/04/2023 Goal Status: Active Interventions: Assess patient pain level pre-, during and post procedure and prior to discharge Provide education on necrotic tissue and debridement process Treatment Activities: Apply topical anesthetic as ordered : 01/31/2023 Notes: Pressure Nursing Diagnoses: Knowledge deficit related to causes and risk factors for pressure ulcer development Knowledge deficit related to management of pressures ulcers Potential for impaired tissue integrity related to pressure, friction, moisture, and shear Goals: Patient will remain free from development of additional  pressure ulcers Date Initiated: 02/01/2023 Target Resolution Date: 03/04/2023 Goal Status: Active Patient/caregiver will verbalize risk factors for pressure ulcer development Date Initiated: 02/01/2023 Target Resolution Date: 03/04/2023 Goal Status: Active Patient/caregiver will verbalize understanding of pressure ulcer management Date Initiated: 02/01/2023 Target Resolution Date: 03/04/2023 Goal Status: Active Interventions: Assess potential for pressure ulcer upon admission and as needed Provide education on pressure ulcers Treatment Activities: T ordered outside of clinic : 01/31/2023 est Notes: Wound/Skin Impairment Nursing Diagnoses: Impaired tissue integrity Knowledge deficit related to ulceration/compromised skin integrity Goals: Patient/caregiver will verbalize understanding of skin care regimen Date Initiated: 02/01/2023 Target Resolution Date: 03/04/2023 Goal Status: Active Ulcer/skin breakdown will have a volume reduction of 30% by week 4 Date Initiated: 02/01/2023 Target Resolution Date: 03/04/2023 Goal Status: Active Ulcer/skin breakdown will have a volume reduction of 50% by week 8 Date Initiated: 02/01/2023 Target Resolution Date: 04/04/2023 Goal Status: Active Ulcer/skin breakdown will have  a volume reduction of 80% by week 12 Date Initiated: 02/01/2023 Target Resolution Date: 05/05/2023 Goal Status: Active Interventions: Assess patient/caregiver ability to obtain necessary supplies Assess patient/caregiver ability to perform ulcer/skin care regimen upon admission and as needed Assess ulceration(s) every visit Provide education on ulcer and skin care Treatment Activities: Referred to DME Marilouise Densmore for dressing supplies : 01/31/2023 Skin care regimen initiated : 01/31/2023 Notes: Electronic Signature(s) Signed: 02/21/2023 4:35:57 PM By: Midge Aver MSN RN CNS [address], [name] ([id number]) [id number] Page 7 of 12 Entered By: Midge Aver on 02/21/2023  15:56:19 -------------------------------------------------------------------------------- Pain Assessment Details Patient Name: Date of Service: [name] 02/21/2023 3:30 PM Medical Record Number: [id number] Patient Account Number: [id number] Date of Birth/Sex: Treating RN: 03-13-1951 (72 y.o. [name] Primary Care Sharesa Kemp: Kerby Nora Other Clinician: Referring Hilma Steinhilber: Treating Kearstyn Avitia/Extender: [name], [name] in Treatment: 3 Active Problems Location of Pain Severity and Description of Pain Patient Has Paino No Site Locations Pain Management and Medication Current Pain Management: Electronic Signature(s) Signed: 02/21/2023 4:35:57 PM By: Midge Aver MSN RN CNS WTA Entered By: Midge Aver on 02/21/2023 15:31:00 -------------------------------------------------------------------------------- Patient/Caregiver Education Details Patient Name: Date of Service: [name] 6/25/2024andnbsp3:30 PM Medical Record Number: [id number] Patient Account Number: [id number] Date of Birth/Gender: Treating RN: [birth year] (72 y.o. [name] Primary Care Physician: Kerby Nora Other Clinician: MATHEAU, [name] ([id number]) [id number] Page 8 of 12 Referring Physician: Treating Physician/Extender: [name], [name] in Treatment: 3 Education Assessment Education Provided To: Patient Education Topics Provided Wound/Skin Impairment: Handouts: Caring for Your Ulcer Methods: Explain/Verbal Responses: State content correctly Electronic Signature(s) Signed: 02/21/2023 4:35:57 PM By: Midge Aver MSN RN CNS WTA Entered By: Midge Aver on 02/21/2023 15:56:35 -------------------------------------------------------------------------------- Wound Assessment Details Patient Name: Date of Service: [name] HN H. 02/21/2023 3:30 PM Medical Record Number: [id number] Patient Account Number: [id number] Date of Birth/Sex:  Treating RN: 1950-09-12 (72 y.o. [name] Primary Care Naseem Varden: Kerby Nora Other Clinician: Referring Bethania Schlotzhauer: Treating Ragena Fiola/Extender: [name], [name] in Treatment: 3 Wound Status Wound Number: 1 Primary Diabetic Wound/Ulcer of the Lower Extremity Etiology: Wound Location: Right Calcaneus Wound Open Wounding Event: Gradually Appeared Status: Date Acquired: 12/10/2022 Comorbid Congestive Heart Failure, Coronary Artery Disease, Weeks Of Treatment: 3 History: Hypertension, Type II Diabetes, Neuropathy Clustered Wound: Yes Photos Wound Measurements Length: (cm) 2.8 Width: (cm) 0.6 Depth: (cm) 0.2 Area: (cm) 1.319 Volume: (cm) 0.264 % Reduction in Area: 69.1% % Reduction in Volume: 38.2% Epithelialization: [name] Wound Description TELESFORO, BROSNAHAN ([id number]) Classification: Grade 1 Exudate Amount: Medium Exudate Type: Serosanguineous Exudate Color: red, brown [id number] Page 9 of 12 Foul Odor After Cleansing: No Slough/Fibrino No Wound Bed Granulation Amount: Medium (34-66%) Exposed Structure Granulation Quality: Red Fascia Exposed: No Necrotic Amount: [name] Present (0%) Fat Layer (Subcutaneous Tissue) Exposed: Yes Tendon Exposed: No Muscle Exposed: No Joint Exposed: No Bone Exposed: No Treatment Notes Wound #1 (Calcaneus) Wound Laterality: Right Cleanser Byram Ancillary Kit - 15 Day Supply Discharge Instruction: Use supplies as instructed; Kit contains: (15) Saline Bullets; (15) 3x3 Gauze; 15 pr Gloves Soap and Water Discharge Instruction: Gently cleanse wound with antibacterial soap, rinse and pat dry prior to dressing wounds Peri-Wound Care Topical Primary Dressing IODOFLEX 0.9% Cadexomer Iodine Pad Discharge Instruction: Apply Iodoflex to wound bed only as directed. Secondary Dressing (BORDER) Zetuvit Plus SILICONE BORDER Dressing 4x4 (in/in) Discharge Instruction: Please do not put silicone bordered  dressings under wraps. Use non-bordered dressing only.  Secured With Compression Wrap Compression Stockings Facilities manager) Signed: 02/21/2023 4:35:57 PM By: Midge Aver MSN RN CNS WTA Entered By: Midge Aver on 02/21/2023 15:39:05 -------------------------------------------------------------------------------- Wound Assessment Details Patient Name: Date of Service: [name] HN H. 02/21/2023 3:30 PM Medical Record Number: [id number] Patient Account Number: [id number] Date of Birth/Sex: Treating RN: 07-25-1951 (72 y.o. [name] Primary Care Atiya Yera: Kerby Nora Other Clinician: Referring Piper Hassebrock: Treating Lyrical Sowle/Extender: [name], [name] in Treatment: 3 Wound Status Wound Number: 2 Primary Diabetic Wound/Ulcer of the Lower Extremity Etiology: Wound Location: Right, Distal, Dorsal Foot Wound Open Wounding Event: Gradually Appeared Status: Date Acquired: 12/10/2022 [name], [name] ([id number]) [id number] Page 10 of 12 Date Acquired: 12/10/2022 Comorbid Congestive Heart Failure, Coronary Artery Disease, Weeks Of Treatment: 3 History: Hypertension, Type II Diabetes, Neuropathy Clustered Wound: No Photos Wound Measurements Length: (cm) 1 Width: (cm) 1 Depth: (cm) 0.2 Area: (cm) 0.785 Volume: (cm) 0.157 % Reduction in Area: 9.1% % Reduction in Volume: -82.6% Epithelialization: Small (1-33%) Wound Description Classification: Grade 1 Exudate Amount: Medium Exudate Type: Serosanguineous Exudate Color: red, brown Foul Odor After Cleansing: No Slough/Fibrino No Wound Bed Granulation Amount: [name] Present (0%) Exposed Structure Necrotic Amount: [name] Present (0%) Fascia Exposed: No Fat Layer (Subcutaneous Tissue) Exposed: Yes Tendon Exposed: No Muscle Exposed: No Joint Exposed: No Bone Exposed: No Treatment Notes Wound #2 (Foot) Wound Laterality: Dorsal, Right, Distal Cleanser Byram Ancillary Kit - 15  Day Supply Discharge Instruction: Use supplies as instructed; Kit contains: (15) Saline Bullets; (15) 3x3 Gauze; 15 pr Gloves Soap and Water Discharge Instruction: Gently cleanse wound with antibacterial soap, rinse and pat dry prior to dressing wounds Peri-Wound Care Topical Primary Dressing IODOFLEX 0.9% Cadexomer Iodine Pad Discharge Instruction: Apply Iodoflex to wound bed only as directed. Secondary Dressing (BORDER) Zetuvit Plus SILICONE BORDER Dressing 4x4 (in/in) Discharge Instruction: Please do not put silicone bordered dressings under wraps. Use non-bordered dressing only. Secured With Compression Wrap Compression Stockings Facilities manager) Signed: 02/21/2023 4:35:57 PM By: Midge Aver MSN RN CNS WTA Entered By: Midge Aver on 02/21/2023 15:39:23 [name] ([id number]) [id number] Page 11 of 12 -------------------------------------------------------------------------------- Wound Assessment Details Patient Name: Date of Service: [name] 02/21/2023 3:30 PM Medical Record Number: [id number] Patient Account Number: [id number] Date of Birth/Sex: Treating RN: 1950/09/24 (72 y.o. [name] Primary Care Lorra Freeman: Kerby Nora Other Clinician: Referring Shadaya Marschner: Treating Hatim Homann/Extender: [name], [name] in Treatment: 3 Wound Status Wound Number: 3 Primary Diabetic Wound/Ulcer of the Lower Extremity Etiology: Wound Location: Right, Proximal, Dorsal Foot Wound Open Wounding Event: Gradually Appeared Status: Date Acquired: 12/10/2022 Comorbid Congestive Heart Failure, Coronary Artery Disease, Weeks Of Treatment: 3 History: Hypertension, Type II Diabetes, Neuropathy Clustered Wound: No Photos Wound Measurements Length: (cm) 1.4 Width: (cm) 1.7 Depth: (cm) 0.2 Area: (cm) 1.869 Volume: (cm) 0.374 % Reduction in Area: 25.6% % Reduction in Volume: -49% Epithelialization: Small (1-33%) Wound  Description Classification: Grade 1 Exudate Amount: Medium Exudate Type: Serosanguineous Exudate Color: red, brown Foul Odor After Cleansing: No Slough/Fibrino Yes Wound Bed Granulation Amount: [name] Present (0%) Exposed Structure Necrotic Amount: Large (67-100%) Fascia Exposed: No Necrotic Quality: Adherent Slough Fat Layer (Subcutaneous Tissue) Exposed: Yes Tendon Exposed: No Muscle Exposed: No Joint Exposed: No Bone Exposed: No Treatment Notes Wound #3 (Foot) Wound Laterality: Dorsal, Right, Proximal Cleanser Soap and Water Discharge Instruction: Gently cleanse wound with antibacterial soap, rinse and pat dry prior to dressing wounds [name], [name] ([id number]) [id number] Page 12  of 12 Peri-Wound Care Topical Primary Dressing IODOFLEX 0.9% Cadexomer Iodine Pad Discharge Instruction: Apply Iodoflex to wound bed only as directed. Secondary Dressing (BORDER) Zetuvit Plus SILICONE BORDER Dressing 4x4 (in/in) Discharge Instruction: Please do not put silicone bordered dressings under wraps. Use non-bordered dressing only. Secured With Compression Wrap Compression Stockings Facilities manager) Signed: 02/21/2023 4:35:57 PM By: Midge Aver MSN RN CNS WTA Entered By: Midge Aver on 02/21/2023 15:39:56 -------------------------------------------------------------------------------- Vitals Details Patient Name: Date of Service: [name] HN H. 02/21/2023 3:30 PM Medical Record Number: [id number] Patient Account Number: [id number] Date of Birth/Sex: Treating RN: 14-Apr-1951 (72 y.o. [name] Primary Care Criss Pallone: Kerby Nora Other Clinician: Referring Naima Veldhuizen: Treating Clotine Heiner/Extender: [name], [name] in Treatment: 3 Vital Signs Time Taken: 15:28 Temperature (F): 98.2 Height (in): 66 Pulse (bpm): 71 Weight (lbs): 250 Respiratory Rate (breaths/min): 18 Body Mass Index (BMI): 40.3 Blood Pressure (mmHg):  133/65 Reference Range: 80 - 120 mg / dl Electronic Signature(s) Signed: 02/21/2023 4:35:57 PM By: Midge Aver MSN RN CNS WTA Entered By: Midge Aver on 02/21/2023 15:30:51

## 2023-02-22 ENCOUNTER — Ambulatory Visit (INDEPENDENT_AMBULATORY_CARE_PROVIDER_SITE_OTHER): Payer: PPO | Admitting: Family Medicine

## 2023-02-22 ENCOUNTER — Other Ambulatory Visit: Payer: Self-pay | Admitting: Cardiovascular Disease

## 2023-02-22 ENCOUNTER — Encounter: Payer: Self-pay | Admitting: Family Medicine

## 2023-02-22 VITALS — BP 102/50 | HR 94 | Temp 98.0°F | Ht 66.0 in | Wt 254.0 lb

## 2023-02-22 DIAGNOSIS — Z794 Long term (current) use of insulin: Secondary | ICD-10-CM

## 2023-02-22 DIAGNOSIS — M545 Low back pain, unspecified: Secondary | ICD-10-CM

## 2023-02-22 DIAGNOSIS — I4819 Other persistent atrial fibrillation: Secondary | ICD-10-CM | POA: Diagnosis not present

## 2023-02-22 DIAGNOSIS — G894 Chronic pain syndrome: Secondary | ICD-10-CM

## 2023-02-22 DIAGNOSIS — G720 Drug-induced myopathy: Secondary | ICD-10-CM | POA: Diagnosis not present

## 2023-02-22 DIAGNOSIS — E11319 Type 2 diabetes mellitus with unspecified diabetic retinopathy without macular edema: Secondary | ICD-10-CM | POA: Diagnosis not present

## 2023-02-22 DIAGNOSIS — T466X5A Adverse effect of antihyperlipidemic and antiarteriosclerotic drugs, initial encounter: Secondary | ICD-10-CM | POA: Diagnosis not present

## 2023-02-22 DIAGNOSIS — Z789 Other specified health status: Secondary | ICD-10-CM | POA: Diagnosis not present

## 2023-02-22 DIAGNOSIS — I4891 Unspecified atrial fibrillation: Secondary | ICD-10-CM | POA: Insufficient documentation

## 2023-02-22 DIAGNOSIS — R52 Pain, unspecified: Secondary | ICD-10-CM

## 2023-02-22 LAB — POCT GLYCOSYLATED HEMOGLOBIN (HGB A1C): Hemoglobin A1C: 6.6 % — AB (ref 4.0–5.6)

## 2023-02-22 NOTE — Addendum Note (Signed)
Addended by: Kerby Nora E on: 02/22/2023 01:31 PM   Modules accepted: Orders

## 2023-02-22 NOTE — Assessment & Plan Note (Signed)
History of trial of multiple statin medications.  On Zetia.  Cardiology looking into PCSK9 inhibitor.

## 2023-02-22 NOTE — Progress Notes (Signed)
Patient ID: [name], male    DOB: [birth year], 72 y.o.   MRN: [id number]  This visit was conducted in person.  BP (!) 102/50 (BP Location: Right Arm, Patient Position: Sitting, Cuff Size: Normal)   Pulse 94   Temp 98 F (36.7 C) (Temporal)   Ht 5\' 6"  (1.676 m)   Wt 254 lb (115.2 kg)   SpO2 94%   BMI 41.00 kg/m    CC:  Chief Complaint  Patient presents with   Diabetes    And A1c check    Subjective:   HPI: [name] is a 72 y.o. male presenting on 02/22/2023 for Diabetes (And A1c check)  Reviewed recent cardiology office visit from January 30, 2023 new diagnosis atrial fibrillation..  Noted at heart failure clinic office visit. Rate controlled with carvedilol 6.25 mg twice daily On Plavix 75 mg daily, started on apixaban for anticoagulation. Plan on anticoagulation for minimum of 3 to 4 weeks and if he remains in atrial fibrillation without converting his self will discuss doing a direct-current cardioversion procedure.  Heart failure controlled with s continued on carvedilol 6.25 mg twice daily, Jardiance 10 mg daily, furosemide 40 mg daily and the second dose daily as needed for weight gain of 2 pounds or greater, Entresto 24/26 mg twice daily and spironolactone 25 mg daily.   Patient reports continued mobility issues as documented at last office visit. Decreased mobility due to chronic low back pain, chronic leg weakness, morbid obesity, peripheral neuropathy and balance issues resulting in need for hover-round/power mobility device to use  in house.   PMD is necessary to perform ADLs at home including getting to bathroom to toilet, getting to kitchen to eat and cooking, getting to bedroom to dress etc.  He is out of balance using a cane.  Legs given out on him after 8 feet of walking... new diagnosis of PAD and upcoming vascular surgery planned to re-vascularize. Can only stand 30 seconds with leaning on anything. He has fallen multiple times given leg weakness and  balance issues.   He cannot use a cane/walker given his poor balance and a leg weakness.  His finger numbness  and cramping that makes it difficult propel a manual wheelchair. Also chronic low back pain limits his ablity to maneuver a manual wheel chair. A motorized wheelchair and Hoveround would be able to fit in his house.  He has back pain and decreased back stability that would make the electric wheelchair the best device for his use. His weakness in his lower extremities would limit him from safely stepping up to a platform to mount a Hoveround scooter.  He can operate a mobility device safely, both mentally and physically.  He is willing and motivated to use the device.Marland Kitchen He would uses it daily during all waking hours.      Diabetes: Well-controlled on Jardiance and Trulicity, NovoLog and Tresiba Lab Results  Component Value Date   HGBA1C 6.6 (A) 02/22/2023  Using medications without difficulties: Hypoglycemic episodes: none Hyperglycemic episodes:  elevated short term to 280 Feet problems: seeing wound doctor... reviewed yesterday OV... ABI showed decreased blood flow Blood Sugars averaging: FBS 109 eye exam within last year: yes  Statin indicated that patient intolerant to multiple statins in the past Currently on Zetia 10 mg daily.  Cardiology looking into a PCSK9 inhibitor.  Relevant past medical, surgical, family and social history reviewed and updated as indicated. Interim medical history since our last visit reviewed. Allergies  and medications reviewed and updated. Outpatient Medications Prior to Visit  Medication Sig Dispense Refill   Acetaminophen (TYLENOL PO) Take 3-4 tablets by mouth as needed (pain).     apixaban (ELIQUIS) 5 MG TABS tablet Take 1 tablet (5 mg total) by mouth 2 (two) times daily. 90 tablet 3   carvedilol (COREG) 6.25 MG tablet Take 1 tablet (6.25 mg total) by mouth 2 (two) times daily with a meal. 60 tablet 5   cholecalciferol (VITAMIN D3) 25 MCG  (1000 UNIT) tablet Take 2,000 Units by mouth daily.     clopidogrel (PLAVIX) 75 MG tablet TAKE ONE TABLET BY MOUTH ONCE DAILY WITH BREAKFAST 90 tablet 3   Continuous Blood Gluc Sensor (FREESTYLE LIBRE 2 SENSOR) MISC APPLY SENSOR EVERY 14 DAYS TO MONITOR SUGAR CONTINOUSLY 2 each 11   Continuous Glucose Receiver (FREESTYLE LIBRE 2 READER) DEVI USE WITH SENSORS TO MONITOR SUGAR CONTINUOUSLY 1 each 0   Dulaglutide (TRULICITY) 4.5 MG/0.5ML SOPN Inject 4.5 mg into the skin once a week. Via [employer] PAP     empagliflozin (JARDIANCE) 10 MG TABS tablet Take 1 tablet (10 mg total) by mouth daily. 30 tablet 5   ezetimibe (ZETIA) 10 MG tablet TAKE 1 TABLET BY MOUTH ONCE DAILY 30 tablet 2   furosemide (LASIX) 40 MG tablet TAKE ONE TABLET BY MOUTH ONCE A DAY CAN TAKE A 2ND DAILY DOSE AS NEEDED. 180 tablet 1   guaiFENesin-dextromethorphan (ROBITUSSIN DM) 100-10 MG/5ML syrup Take 5 mLs by mouth every 4 (four) hours as needed for cough. 118 mL 0   HYDROcodone-acetaminophen (NORCO) 5-325 MG tablet Take 1-2 tablets by mouth daily as needed for moderate pain. 60 tablet 0   HYDROcodone-acetaminophen (NORCO) 5-325 MG tablet Take 1-2 tablets by mouth daily as needed for moderate pain. April 60 tablet 0   HYDROcodone-acetaminophen (NORCO/VICODIN) 5-325 MG tablet TAKE ONE (1) TO TWO (2) TABLETS BY MOUTH DAILY AS NEEDED FOR MODERATE PAIN. MAY 60 tablet 0   hydrOXYzine (ATARAX) 10 MG tablet TAKE 1 TABLET BY MOUTH 3 TIMES DAILY AS NEEDED FOR ANXIETY 30 tablet 2   insulin aspart (NOVOLOG FLEXPEN) 100 UNIT/ML FlexPen 13 units in AM (scheduled) and 3 units PRN in evening (Patient taking differently: Inject 3-13 Units into the skin in the morning and at bedtime. Inject 13 units in the morning and 3 units at night) 15 mL 6   ketoconazole (NIZORAL) 2 % cream Apply 1 Application topically daily. 15 g 0   nitroGLYCERIN (NITROSTAT) 0.4 MG SL tablet DISSOLVE 1 TABLET UNDER TONGUE AS NEEDEDFOR CHEST PAIN. MAY REPEAT 5 MINUTES APART 3  TIMES IF NEEDED 25 tablet 3   sacubitril-valsartan (ENTRESTO) 24-26 MG Take 1 tablet by mouth 2 (two) times daily. 60 tablet 3   spironolactone (ALDACTONE) 25 MG tablet Take 1 tablet (25 mg total) by mouth daily. 30 tablet 5   terbinafine (LAMISIL) 250 MG tablet TAKE ONE TABLET (250 MG TOTAL) BY MOUTH DAILY. 30 tablet 0   TRESIBA FLEXTOUCH 100 UNIT/ML FlexTouch Pen INJECT 50 UNITS INTO THE SKIN DAILY 15 mL 2   triamcinolone cream (KENALOG) 0.5 % APPLY ONE APPLICATION TOPICALLY TWO (TWO) TIMES DAILY. 30 g 0   TRUEPLUS 5-BEVEL PEN NEEDLES 31G X 6 MM MISC USE TO INJECT INSULIN THREE TIMES A DAY 300 each 3   venlafaxine XR (EFFEXOR-XR) 150 MG 24 hr capsule TAKE 1 CAPSULE BY MOUTH DAILY WITH BREAKFAST. TAKE WITH EFFEXOR XR 75MG  FORA TOTAL OF 225MG  (Patient taking differently: Take 150 mg  by mouth daily with breakfast. Take with 75 mg) 90 capsule 0   vitamin B-12 (CYANOCOBALAMIN) 1000 MCG tablet Take 1,000 mcg by mouth daily.     isosorbide mononitrate (IMDUR) 30 MG 24 hr tablet Take 1 tablet (30 mg total) by mouth 2 (two) times daily. 60 tablet 2   No facility-administered medications prior to visit.     Per HPI unless specifically indicated in ROS section below Review of Systems  Constitutional:  Positive for fatigue. Negative for diaphoresis and fever.  HENT:  Negative for ear pain.   Eyes:  Negative for pain.  Respiratory:  Negative for cough and shortness of breath.   Cardiovascular:  Negative for chest pain, palpitations and leg swelling.  Gastrointestinal:  Negative for abdominal pain.  Genitourinary:  Negative for dysuria.  Musculoskeletal:  Negative for arthralgias.  Neurological:  Negative for syncope, light-headedness and headaches.  Psychiatric/Behavioral:  Negative for dysphoric mood.    Objective:  BP (!) 102/50 (BP Location: Right Arm, Patient Position: Sitting, Cuff Size: Normal)   Pulse 94   Temp 98 F (36.7 C) (Temporal)   Ht 5\' 6"  (1.676 m)   Wt 254 lb (115.2 kg)   SpO2  94%   BMI 41.00 kg/m   Wt Readings from Last 3 Encounters:  02/22/23 254 lb (115.2 kg)  01/30/23 252 lb (114.3 kg)  01/30/23 253 lb (114.8 kg)      Physical Exam Constitutional:      Appearance: He is well-developed. He is obese.  HENT:     Head: Normocephalic.     Right Ear: Hearing normal.     Left Ear: Hearing normal.     Nose: Nose normal.  Neck:     Thyroid: No thyroid mass or thyromegaly.     Vascular: No carotid bruit.     Trachea: Trachea normal.  Cardiovascular:     Rate and Rhythm: Normal rate. Rhythm irregularly irregular.     Pulses: Normal pulses.     Heart sounds: Heart sounds not distant. No murmur heard.    No friction rub. No gallop.     Comments: No peripheral edema Pulmonary:     Effort: Pulmonary effort is normal. No respiratory distress.     Breath sounds: Normal breath sounds.  Musculoskeletal:     Right shoulder: Decreased range of motion.     Left shoulder: Decreased range of motion.     Cervical back: Decreased range of motion.     Thoracic back: Decreased range of motion.     Lumbar back: Tenderness present. Decreased range of motion.     Right hip: Decreased range of motion.     Left hip: Decreased range of motion.     Right knee: Bony tenderness present. Decreased range of motion. Tenderness present over the medial joint line and lateral joint line.     Left knee: Bony tenderness present. Decreased range of motion. Tenderness present over the medial joint line and lateral joint line.  Skin:    General: Skin is warm and dry.     Findings: No rash.  Neurological:     Mental Status: He is oriented to person, place, and time. Mental status is at baseline.     Cranial Nerves: Cranial nerves 2-12 are intact.     Sensory: Sensory deficit present.     Motor: Weakness present.     Comments: Decreased sensation to light touch in feet Upper extremity strength 5 out of 5 bilaterally Hip flexor strength 4  out of 5 bilaterally Knee extension and  flexion 4 out of 5 bilaterally Ankle flexion extension 4 out of 5 bilaterally  Gait wobbly and antalgic. Unable to ambulate more than 5 feet without assistance. Can only stand unassisted for 5 seconds without needing to sit given bilateral leg weakness.  Psychiatric:        Speech: Speech normal.        Behavior: Behavior normal.        Thought Content: Thought content normal.       Results for orders placed or performed in visit on 02/22/23  POCT glycosylated hemoglobin (Hb A1C)  Result Value Ref Range   Hemoglobin A1C 6.6 (A) 4.0 - 5.6 %   HbA1c POC (<> result, manual entry)     HbA1c, POC (prediabetic range)     HbA1c, POC (controlled diabetic range)      Assessment and Plan  Type 2 diabetes mellitus with retinopathy of both eyes, with long-term current use of insulin, macular edema presence unspecified, unspecified retinopathy severity (HCC) Assessment & Plan: Chronic, improved control in office   Now doing well back on Trulicity 4.5 mg weekly. [name] and NovoLog  Jardiance 10 mg p.o. daily   Reevaluate in 3 months.  Orders: -     POCT glycosylated hemoglobin (Hb A1C) -     Hemoglobin A1c; Future -     Lipid panel; Future -     Comprehensive metabolic panel; Future -     Microalbumin / creatinine urine ratio; Future  Statin myopathy Assessment & Plan: History of trial of multiple statin medications.  On Zetia.  Cardiology looking into PCSK9 inhibitor.   Chronic pain syndrome -     Drug Screen 10 W/Conf, Serum; Future  Alteration in mobility associated with pain Assessment & Plan: Decreased mobility due to chronic low back pain, chronic leg weakness, morbid obesity, peripheral neuropathy and balance issues resulting in need for hover-round/power mobility device to use  in house.   PMD is necessary to perform ADLs at home including getting to bathroom to toilet, getting to kitchen to eat and cooking, getting to bedroom to dress etc.  He is out of balance using  a cane.  Legs given out on him after 8 feet of walking... new diagnosis of PAD and upcoming vascular surgery planned to re-vascularize. Can only stand 30 seconds with leaning on anything. He has fallen multiple times given leg weakness and balance issues.   He cannot use a cane/walker given his poor balance and a leg weakness.  His finger numbness  and cramping that makes it difficult propel a manual wheelchair. Also chronic low back pain limits his ablity to maneuver a manual wheel chair. A motorized wheelchair and Hoveround would be able to fit in his house.  He has back pain and decreased back stability that would make the electric wheelchair the best device for his use. His weakness in his lower extremities would limit him from safely stepping up to a platform to mount a Hoveround scooter.  He can operate a mobility device safely, both mentally and physically.  He is willing and motivated to use the device.Marland Kitchen He would uses it daily during all waking hours.     Chronic midline low back pain, unspecified whether sciatica present  Persistent atrial fibrillation (HCC) Assessment & Plan: Acute, new diagnosis Followed by cardiology.  On anticoagulation with possible cardioversion planned.     Return in about 3 months (around 05/14/2023) for  chrpnic pain managemnet  and DM follow up wiht fasting labs prior.   Kerby Nora, MD

## 2023-02-22 NOTE — Assessment & Plan Note (Signed)
Acute, new diagnosis Followed by cardiology.  On anticoagulation with possible cardioversion planned.

## 2023-02-22 NOTE — Assessment & Plan Note (Signed)
Decreased mobility due to chronic low back pain, chronic leg weakness, morbid obesity, peripheral neuropathy and balance issues resulting in need for hover-round/power mobility device to use  in house.   PMD is necessary to perform ADLs at home including getting to bathroom to toilet, getting to kitchen to eat and cooking, getting to bedroom to dress etc.  He is out of balance using a cane.  Legs given out on him after 8 feet of walking... new diagnosis of PAD and upcoming vascular surgery planned to re-vascularize. Can only stand 30 seconds with leaning on anything. He has fallen multiple times given leg weakness and balance issues.   He cannot use a cane/walker given his poor balance and a leg weakness.  His finger numbness  and cramping that makes it difficult propel a manual wheelchair. Also chronic low back pain limits his ablity to maneuver a manual wheel chair. A motorized wheelchair and Hoveround would be able to fit in his house.  He has back pain and decreased back stability that would make the electric wheelchair the best device for his use. His weakness in his lower extremities would limit him from safely stepping up to a platform to mount a Hoveround scooter.  He can operate a mobility device safely, both mentally and physically.  He is willing and motivated to use the device.Marland Kitchen He would uses it daily during all waking hours.

## 2023-02-22 NOTE — Assessment & Plan Note (Signed)
Chronic, improved control in office   Now doing well back on Trulicity 4.5 mg weekly. [name] and NovoLog  Jardiance 10 mg p.o. daily   Reevaluate in 3 months.

## 2023-02-23 ENCOUNTER — Ambulatory Visit: Payer: PPO | Admitting: Cardiology

## 2023-02-23 NOTE — Progress Notes (Signed)
Date:  02/24/2023   ID:  [name], DOB [birth year], MRN [id number]  Patient Location:  [address] [place] [address]   Provider location:   [facility], [place] office  PCP:  [name], MD  Cardiologist:  Fonnie Mu  Chief Complaint  Patient presents with   Atrial Fibrillation    New onset A-fib and evaluate need for cardioversion [name], NP.  DOE Medications reviewed verbally by patient.      History of Present Illness:    [name] is a 72 y.o. male past medical history of morbid obesity,  HTN,  DM,  CAD  NSTEMI in 11/2015 (PCI to LAD; was discharged on Plavix) and again 02/2016 (no PCI; was discharged on Brilinta), Again presented to [facility] ER on 04/05/16 for NSTEMI (no PCI),  catheterization 04/06/2016, stable LAD stent, occluded RCA with collaterals Chronic total occlusion of RCA EF 55 to 60% Chronic chest pain syndrome GERD Who presents for follow-up of his coronary artery disease  Last office visit with myself 5/23 He presents today with his daughter  On last clinic visit diagnosed with atrial fibrillation On carvedilol 6.25 twice daily for rate control, Eliquis 5 twice daily Interested in restoring normal sinus rhythm Reports that he is relatively sedentary, asymptomatic from his atrial fibrillation Trace lower extremity edema, on Lasix daily 40 mg  May need workup for diabetic ulcers Likely needs lower extremity arterial angiography with vascular  EKG personally reviewed by myself on todays visit EKG Interpretation Date/Time:  Friday February 24 2023 16:15:44 EDT Ventricular Rate:  72 PR Interval:    QRS Duration:  92 QT Interval:  376 QTC Calculation: 411 R Axis:   -13  Text Interpretation: Atrial fibrillation with a competing junctional pacemaker Inferior infarct , age undetermined When compared with ECG of 30-Jan-2023 14:58, Inferior infarct is now Present Confirmed by Julien Nordmann [phone]) on  02/24/2023 4:42:21 PM    Long history of atypical chest pain, Numerous cardiac catheterizations including July 2017, August 2017, treated medically   Prior catheterization Chronic total occlusion of the right coronary in the proximal segment well collateralized from the left circumflex and LAD. Widely patent proximal to mid LAD stent previously placed in July. There is first diagonal diagonal and LAD 30 and 50% narrowing respectively. Widely patent circumflex unchanged from previous with 70% narrowing in a small branch of the first marginal. Circumflex collateralizes the distal right coronary left ventricular branch. Inferobasal hypokinesis. EF 50%. EDP is normal. Unable to identify a significant change in the angiographic appearance since prior study in July.   Past Medical History:  Diagnosis Date   Back injury 02/2002   worker's comp   CHF (congestive heart failure) (HCC)    Coronary artery disease, non-occlusive    a. cath 2002 with no sig CAD;  b. cath 2008 normal LM, LAD, LCx, p&dRCA 20-30%, PDA 30%; c.11/2015 NSTEMI/PCI: LM nl, LAD 95p (2.5x15 Xience DES), LCX nl, RCA 100p/m w/ L->R collats, EF 55-65% c. NSTEMI (02/2016) with no culprit leision, switched to Brilinta.  d. NSTEMI 03/2016: again, no culprit lesion and switched back to plavix 2/2 SOB with Brilnta.     Depression    Diabetes mellitus type 2, insulin dependent (HCC)    Hyperlipemia    Hypertension    Hypertensive heart disease    Kidney stones    Morbid obesity (HCC)    Osteoarthritis    Snoring    Past  Surgical History:  Procedure Laterality Date   CARDIAC CATHETERIZATION  09/29/2000   diffuse LAD 30% LCA  EF 50-60%   CARDIAC CATHETERIZATION  06/30/2007   no significant CAD   CARDIAC CATHETERIZATION N/A 12/25/2015   Procedure: Left Heart Cath and Coronary Angiography;  Surgeon: Antonieta Iba, MD;  Location: [facility] INVASIVE CV LAB;  Service: Cardiovascular;  Laterality: N/A;   CARDIAC CATHETERIZATION N/A  12/25/2015   Procedure: Coronary Stent Intervention;  Surgeon: Alwyn Pea, MD;  Location: [facility] INVASIVE CV LAB;  Service: Cardiovascular;  Laterality: N/A;   CARDIAC CATHETERIZATION N/A 03/10/2016   Procedure: Left Heart Cath and Coronary Angiography;  Surgeon: Iran Ouch, MD;  Location: [facility] INVASIVE CV LAB;  Service: Cardiovascular;  Laterality: N/A;   CARDIAC CATHETERIZATION N/A 04/06/2016   Procedure: Left Heart Cath and Coronary Angiography;  Surgeon: Lyn Records, MD;  Location: [facility] INVASIVE CV LAB;  Service: Cardiovascular;  Laterality: N/A;   CIRCUMCISION     CORONARY ARTERY BYPASS GRAFT       Current Meds  Medication Sig   Acetaminophen (TYLENOL PO) Take 3-4 tablets by mouth as needed (pain).   apixaban (ELIQUIS) 5 MG TABS tablet Take 1 tablet (5 mg total) by mouth 2 (two) times daily.   carvedilol (COREG) 6.25 MG tablet Take 1 tablet (6.25 mg total) by mouth 2 (two) times daily with a meal.   cholecalciferol (VITAMIN D3) 25 MCG (1000 UNIT) tablet Take 2,000 Units by mouth daily.   clopidogrel (PLAVIX) 75 MG tablet TAKE ONE TABLET BY MOUTH ONCE DAILY WITH BREAKFAST   Continuous Blood Gluc Sensor (FREESTYLE LIBRE 2 SENSOR) MISC APPLY SENSOR EVERY 14 DAYS TO MONITOR SUGAR CONTINOUSLY   Continuous Glucose Receiver (FREESTYLE LIBRE 2 READER) DEVI USE WITH SENSORS TO MONITOR SUGAR CONTINUOUSLY   Dulaglutide (TRULICITY) 4.5 MG/0.5ML SOPN Inject 4.5 mg into the skin once a week. Via [employer] Cares PAP   empagliflozin (JARDIANCE) 10 MG TABS tablet Take 1 tablet (10 mg total) by mouth daily.   ezetimibe (ZETIA) 10 MG tablet TAKE ONE TABLET BY MOUTH ONCE DAILY   furosemide (LASIX) 40 MG tablet TAKE ONE TABLET BY MOUTH ONCE A DAY CAN TAKE A 2ND DAILY DOSE AS NEEDED.   guaiFENesin-dextromethorphan (ROBITUSSIN DM) 100-10 MG/5ML syrup Take 5 mLs by mouth every 4 (four) hours as needed for cough.   HYDROcodone-acetaminophen (NORCO) 5-325 MG tablet Take 1-2 tablets by mouth daily as needed  for moderate pain.   HYDROcodone-acetaminophen (NORCO) 5-325 MG tablet Take 1-2 tablets by mouth daily as needed for moderate pain. April   HYDROcodone-acetaminophen (NORCO/VICODIN) 5-325 MG tablet TAKE ONE (1) TO TWO (2) TABLETS BY MOUTH DAILY AS NEEDED FOR MODERATE PAIN. MAY   hydrOXYzine (ATARAX) 10 MG tablet TAKE 1 TABLET BY MOUTH 3 TIMES DAILY AS NEEDED FOR ANXIETY   insulin aspart (NOVOLOG FLEXPEN) 100 UNIT/ML FlexPen 13 units in AM (scheduled) and 3 units PRN in evening (Patient taking differently: Inject 3-13 Units into the skin in the morning and at bedtime. Inject 13 units in the morning and 3 units at night)   sacubitril-valsartan (ENTRESTO) 24-26 MG Take 1 tablet by mouth 2 (two) times daily.   spironolactone (ALDACTONE) 25 MG tablet Take 1 tablet (25 mg total) by mouth daily.   terbinafine (LAMISIL) 250 MG tablet TAKE ONE TABLET (250 MG TOTAL) BY MOUTH DAILY.   TRESIBA FLEXTOUCH 100 UNIT/ML FlexTouch Pen INJECT 50 UNITS INTO THE SKIN DAILY   triamcinolone cream (KENALOG) 0.5 % APPLY ONE  APPLICATION TOPICALLY TWO (TWO) TIMES DAILY.   TRUEPLUS 5-BEVEL PEN NEEDLES 31G X 6 MM MISC USE TO INJECT INSULIN THREE TIMES A DAY   venlafaxine XR (EFFEXOR-XR) 150 MG 24 hr capsule TAKE 1 CAPSULE BY MOUTH DAILY WITH BREAKFAST. TAKE WITH EFFEXOR XR 75MG  FORA TOTAL OF 225MG  (Patient taking differently: Take 150 mg by mouth daily with breakfast. Take with 75 mg)   vitamin B-12 (CYANOCOBALAMIN) 1000 MCG tablet Take 1,000 mcg by mouth daily.     Allergies:   Bupropion, Ozempic (0.25 or 0.5 mg-dose) [semaglutide(0.25 or 0.5mg -dos)], and Atorvastatin   Social History   Tobacco Use   Smoking status: Never   Smokeless tobacco: Never  Vaping Use   Vaping Use: Never used  Substance Use Topics   Alcohol use: No    Alcohol/week: 0.0 standard drinks of alcohol   Drug use: No     Family Hx: [name] family history includes Alzheimer's disease in his mother; Cancer in his brother; Diabetes in his  father; Emphysema in his mother; Heart disease in his father.  ROS:   Please see the history of present illness.    Review of Systems  Constitutional: Negative.   Respiratory: Negative.    Cardiovascular: Negative.   Gastrointestinal: Negative.   Musculoskeletal: Negative.   Neurological: Negative.   Psychiatric/Behavioral: Negative.    All other systems reviewed and are negative.     Labs/Other Tests and Data Reviewed:    Recent Labs: 11/05/2022: ALT 16; B Natriuretic Peptide 1,240.5; Magnesium 1.9 01/30/2023: BUN 24; Creatinine, Ser 0.98; Hemoglobin 12.5; Platelets 256; Potassium 4.4; Sodium 141; TSH 2.224   Recent Lipid Panel Lab Results  Component Value Date/Time   CHOL 107 11/02/2020 08:42 AM   TRIG 82.0 11/02/2020 08:42 AM   HDL 34.30 (L) 11/02/2020 08:42 AM   CHOLHDL 3 11/02/2020 08:42 AM   LDLCALC 56 11/02/2020 08:42 AM   LDLDIRECT 174.0 10/19/2017 04:00 PM    Wt Readings from Last 3 Encounters:  02/24/23 252 lb 3.2 oz (114.4 kg)  02/22/23 254 lb (115.2 kg)  01/30/23 252 lb (114.3 kg)     Exam:   BP 130/70 (BP Location: Left Arm, Patient Position: Sitting, Cuff Size: Normal)   Pulse 72   Ht 5\' 6"  (1.676 m)   Wt 252 lb 3.2 oz (114.4 kg)   SpO2 97%   BMI 40.71 kg/m  Constitutional:  oriented to person, place, and time. No distress.  HENT:  Head: Grossly normal Eyes:  no discharge. No scleral icterus.  Neck: No JVD, no carotid bruits  Cardiovascular: Regular rate and rhythm, no murmurs appreciated Pulmonary/Chest: Clear to auscultation bilaterally, no wheezes or rails Abdominal: Soft.  no distension.  no tenderness.  Musculoskeletal: Normal range of motion Neurological:  normal muscle tone. Coordination normal. No atrophy Skin: Skin warm and dry Psychiatric: normal affect, pleasant   ASSESSMENT & PLAN:   Atrial fibrillation, persistent We have recommended he increase carvedilol up to 12.5 twice daily Continue Eliquis 5 twice daily We will arrange  cardioversion next week, orders placed, Will need to hold some of his diabetes medications in preparation for general anesthesia  Coronary artery disease of native artery of native heart with stable angina pectoris [facility]) Echocardiogram March 2024 EF 40 to 45% Currently with no symptoms of angina. No further workup at this time. Continue current medication regimen.  Morbid obesity (HCC) We have encouraged continued exercise, careful diet management in an effort to lose weight.  Hyperlipidemia LDL goal <70 Cholesterol  is at goal on the current lipid regimen. No changes to the medications were made.  Only on Zetia Statin intolerance  Essential hypertension, benign Blood pressure is well controlled on today's visit.  Carvedilol up to 12.5 twice daily  DM (diabetes mellitus), secondary, uncontrolled, w/eye complications (HCC) A1c stable 6.6, improving on current medications  GERD: Stable, takes PPI   Total encounter time more than 40 minutes  Greater than 50% was spent in counseling and coordination of care with the patient    Signed, Julien Nordmann, MD  02/24/2023 4:37 PM    [facility] [address], [place], [place] [address]

## 2023-02-23 NOTE — H&P (View-Only) (Signed)
      Date:  02/24/2023   ID:  [name], DOB 02/08/1951, MRN [id number]  Patient Location:  [address] [place] [address]   Provider location:   [facility], [place] office  PCP:  Bedsole, Amy E, MD  Cardiologist:  Kaoru Rezendes, [facility]  Chief Complaint  Patient presents with   Atrial Fibrillation    New onset A-fib and evaluate need for cardioversion ber Sheri, NP.  DOE Medications reviewed verbally by patient.      History of Present Illness:    [name] is a 72 y.o. male past medical history of morbid obesity,  HTN,  DM,  CAD  NSTEMI in 11/2015 (PCI to LAD; was discharged on Plavix) and again 02/2016 (no PCI; was discharged on Brilinta), Again presented to [facility] ER on 04/05/16 for NSTEMI (no PCI),  catheterization 04/06/2016, stable LAD stent, occluded RCA with collaterals Chronic total occlusion of RCA EF 55 to 60% Chronic chest pain syndrome GERD Who presents for follow-up of his coronary artery disease  Last office visit with myself 5/23 He presents today with his daughter  On last clinic visit diagnosed with atrial fibrillation On carvedilol 6.25 twice daily for rate control, Eliquis 5 twice daily Interested in restoring normal sinus rhythm Reports that he is relatively sedentary, asymptomatic from his atrial fibrillation Trace lower extremity edema, on Lasix daily 40 mg  May need workup for diabetic ulcers Likely needs lower extremity arterial angiography with vascular  EKG personally reviewed by myself on todays visit EKG Interpretation Date/Time:  Friday February 24 2023 16:15:44 EDT Ventricular Rate:  72 PR Interval:    QRS Duration:  92 QT Interval:  376 QTC Calculation: 411 R Axis:   -13  Text Interpretation: Atrial fibrillation with a competing junctional pacemaker Inferior infarct , age undetermined When compared with ECG of 30-Jan-2023 14:58, Inferior infarct is now Present Confirmed by Tay Whitwell ([id number]) on  02/24/2023 4:42:21 PM    Long history of atypical chest pain, Numerous cardiac catheterizations including July 2017, August 2017, treated medically   Prior catheterization Chronic total occlusion of the right coronary in the proximal segment well collateralized from the left circumflex and LAD. Widely patent proximal to mid LAD stent previously placed in July. There is first diagonal diagonal and LAD 30 and 50% narrowing respectively. Widely patent circumflex unchanged from previous with 70% narrowing in a small branch of the first marginal. Circumflex collateralizes the distal right coronary left ventricular branch. Inferobasal hypokinesis. EF 50%. EDP is normal. Unable to identify a significant change in the angiographic appearance since prior study in July.   Past Medical History:  Diagnosis Date   Back injury 02/2002   worker's comp   CHF (congestive heart failure) (HCC)    Coronary artery disease, non-occlusive    a. cath 2002 with no sig CAD;  b. cath 2008 normal LM, LAD, LCx, p&dRCA 20-30%, PDA 30%; c.11/2015 NSTEMI/PCI: LM nl, LAD 95p (2.5x15 Xience DES), LCX nl, RCA 100p/m w/ L->R collats, EF 55-65% c. NSTEMI (02/2016) with no culprit leision, switched to Brilinta.  d. NSTEMI 03/2016: again, no culprit lesion and switched back to plavix 2/2 SOB with Brilnta.     Depression    Diabetes mellitus type 2, insulin dependent (HCC)    Hyperlipemia    Hypertension    Hypertensive heart disease    Kidney stones    Morbid obesity (HCC)    Osteoarthritis    Snoring    Past   Surgical History:  Procedure Laterality Date   CARDIAC CATHETERIZATION  09/29/2000   diffuse LAD 30% LCA  EF 50-60%   CARDIAC CATHETERIZATION  06/30/2007   no significant CAD   CARDIAC CATHETERIZATION N/A 12/25/2015   Procedure: Left Heart Cath and Coronary Angiography;  Surgeon: Fedora Knisely J Hosanna Betley, MD;  Location: [facility] INVASIVE CV LAB;  Service: Cardiovascular;  Laterality: N/A;   CARDIAC CATHETERIZATION N/A  12/25/2015   Procedure: Coronary Stent Intervention;  Surgeon: Dwayne D Callwood, MD;  Location: [facility] INVASIVE CV LAB;  Service: Cardiovascular;  Laterality: N/A;   CARDIAC CATHETERIZATION N/A 03/10/2016   Procedure: Left Heart Cath and Coronary Angiography;  Surgeon: Muhammad A Arida, MD;  Location: [facility] INVASIVE CV LAB;  Service: Cardiovascular;  Laterality: N/A;   CARDIAC CATHETERIZATION N/A 04/06/2016   Procedure: Left Heart Cath and Coronary Angiography;  Surgeon: Henry W Smith, MD;  Location: [facility] INVASIVE CV LAB;  Service: Cardiovascular;  Laterality: N/A;   CIRCUMCISION     CORONARY ARTERY BYPASS GRAFT       Current Meds  Medication Sig   Acetaminophen (TYLENOL PO) Take 3-4 tablets by mouth as needed (pain).   apixaban (ELIQUIS) 5 MG TABS tablet Take 1 tablet (5 mg total) by mouth 2 (two) times daily.   carvedilol (COREG) 6.25 MG tablet Take 1 tablet (6.25 mg total) by mouth 2 (two) times daily with a meal.   cholecalciferol (VITAMIN D3) 25 MCG (1000 UNIT) tablet Take 2,000 Units by mouth daily.   clopidogrel (PLAVIX) 75 MG tablet TAKE ONE TABLET BY MOUTH ONCE DAILY WITH BREAKFAST   Continuous Blood Gluc Sensor (FREESTYLE LIBRE 2 SENSOR) MISC APPLY SENSOR EVERY 14 DAYS TO MONITOR SUGAR CONTINOUSLY   Continuous Glucose Receiver (FREESTYLE LIBRE 2 READER) DEVI USE WITH SENSORS TO MONITOR SUGAR CONTINUOUSLY   Dulaglutide (TRULICITY) 4.5 MG/0.5ML SOPN Inject 4.5 mg into the skin once a week. Via [employer] Cares PAP   empagliflozin (JARDIANCE) 10 MG TABS tablet Take 1 tablet (10 mg total) by mouth daily.   ezetimibe (ZETIA) 10 MG tablet TAKE ONE TABLET BY MOUTH ONCE DAILY   furosemide (LASIX) 40 MG tablet TAKE ONE TABLET BY MOUTH ONCE A DAY CAN TAKE A 2ND DAILY DOSE AS NEEDED.   guaiFENesin-dextromethorphan (ROBITUSSIN DM) 100-10 MG/5ML syrup Take 5 mLs by mouth every 4 (four) hours as needed for cough.   HYDROcodone-acetaminophen (NORCO) 5-325 MG tablet Take 1-2 tablets by mouth daily as needed  for moderate pain.   HYDROcodone-acetaminophen (NORCO) 5-325 MG tablet Take 1-2 tablets by mouth daily as needed for moderate pain. April   HYDROcodone-acetaminophen (NORCO/VICODIN) 5-325 MG tablet TAKE ONE (1) TO TWO (2) TABLETS BY MOUTH DAILY AS NEEDED FOR MODERATE PAIN. MAY   hydrOXYzine (ATARAX) 10 MG tablet TAKE 1 TABLET BY MOUTH 3 TIMES DAILY AS NEEDED FOR ANXIETY   insulin aspart (NOVOLOG FLEXPEN) 100 UNIT/ML FlexPen 13 units in AM (scheduled) and 3 units PRN in evening (Patient taking differently: Inject 3-13 Units into the skin in the morning and at bedtime. Inject 13 units in the morning and 3 units at night)   sacubitril-valsartan (ENTRESTO) 24-26 MG Take 1 tablet by mouth 2 (two) times daily.   spironolactone (ALDACTONE) 25 MG tablet Take 1 tablet (25 mg total) by mouth daily.   terbinafine (LAMISIL) 250 MG tablet TAKE ONE TABLET (250 MG TOTAL) BY MOUTH DAILY.   TRESIBA FLEXTOUCH 100 UNIT/ML FlexTouch Pen INJECT 50 UNITS INTO THE SKIN DAILY   triamcinolone cream (KENALOG) 0.5 % APPLY ONE   APPLICATION TOPICALLY TWO (TWO) TIMES DAILY.   TRUEPLUS 5-BEVEL PEN NEEDLES 31G X 6 MM MISC USE TO INJECT INSULIN THREE TIMES A DAY   venlafaxine XR (EFFEXOR-XR) 150 MG 24 hr capsule TAKE 1 CAPSULE BY MOUTH DAILY WITH BREAKFAST. TAKE WITH EFFEXOR XR 75MG FORA TOTAL OF 225MG (Patient taking differently: Take 150 mg by mouth daily with breakfast. Take with 75 mg)   vitamin B-12 (CYANOCOBALAMIN) 1000 MCG tablet Take 1,000 mcg by mouth daily.     Allergies:   Bupropion, Ozempic (0.25 or 0.5 mg-dose) [semaglutide(0.25 or 0.5mg-dos)], and Atorvastatin   Social History   Tobacco Use   Smoking status: Never   Smokeless tobacco: Never  Vaping Use   Vaping Use: Never used  Substance Use Topics   Alcohol use: No    Alcohol/week: 0.0 standard drinks of alcohol   Drug use: No     Family Hx: [name] family history includes Alzheimer's disease in his mother; Cancer in his brother; Diabetes in his  father; Emphysema in his mother; Heart disease in his father.  ROS:   Please see the history of present illness.    Review of Systems  Constitutional: Negative.   Respiratory: Negative.    Cardiovascular: Negative.   Gastrointestinal: Negative.   Musculoskeletal: Negative.   Neurological: Negative.   Psychiatric/Behavioral: Negative.    All other systems reviewed and are negative.     Labs/Other Tests and Data Reviewed:    Recent Labs: 11/05/2022: ALT 16; B Natriuretic Peptide 1,240.5; Magnesium 1.9 01/30/2023: BUN 24; Creatinine, Ser 0.98; Hemoglobin 12.5; Platelets 256; Potassium 4.4; Sodium 141; TSH 2.224   Recent Lipid Panel Lab Results  Component Value Date/Time   CHOL 107 11/02/2020 08:42 AM   TRIG 82.0 11/02/2020 08:42 AM   HDL 34.30 (L) 11/02/2020 08:42 AM   CHOLHDL 3 11/02/2020 08:42 AM   LDLCALC 56 11/02/2020 08:42 AM   LDLDIRECT 174.0 10/19/2017 04:00 PM    Wt Readings from Last 3 Encounters:  02/24/23 252 lb 3.2 oz (114.4 kg)  02/22/23 254 lb (115.2 kg)  01/30/23 252 lb (114.3 kg)     Exam:   BP 130/70 (BP Location: Left Arm, Patient Position: Sitting, Cuff Size: Normal)   Pulse 72   Ht 5' 6" (1.676 m)   Wt 252 lb 3.2 oz (114.4 kg)   SpO2 97%   BMI 40.71 kg/m  Constitutional:  oriented to person, place, and time. No distress.  HENT:  Head: Grossly normal Eyes:  no discharge. No scleral icterus.  Neck: No JVD, no carotid bruits  Cardiovascular: Regular rate and rhythm, no murmurs appreciated Pulmonary/Chest: Clear to auscultation bilaterally, no wheezes or rails Abdominal: Soft.  no distension.  no tenderness.  Musculoskeletal: Normal range of motion Neurological:  normal muscle tone. Coordination normal. No atrophy Skin: Skin warm and dry Psychiatric: normal affect, pleasant   ASSESSMENT & PLAN:   Atrial fibrillation, persistent We have recommended he increase carvedilol up to 12.5 twice daily Continue Eliquis 5 twice daily We will arrange  cardioversion next week, orders placed, Will need to hold some of his diabetes medications in preparation for general anesthesia  Coronary artery disease of native artery of native heart with stable angina pectoris (HCC) Echocardiogram March 2024 EF 40 to 45% Currently with no symptoms of angina. No further workup at this time. Continue current medication regimen.  Morbid obesity (HCC) We have encouraged continued exercise, careful diet management in an effort to lose weight.  Hyperlipidemia LDL goal <70 Cholesterol   is at goal on the current lipid regimen. No changes to the medications were made.  Only on Zetia Statin intolerance  Essential hypertension, benign Blood pressure is well controlled on today's visit.  Carvedilol up to 12.5 twice daily  DM (diabetes mellitus), secondary, uncontrolled, w/eye complications (HCC) A1c stable 6.6, improving on current medications  GERD: Stable, takes PPI   Total encounter time more than 40 minutes  Greater than 50% was spent in counseling and coordination of care with the patient    Signed, Chelby Salata, MD  02/24/2023 4:37 PM    [facility] [place] Office [address], [place], [place] [address]  

## 2023-02-24 ENCOUNTER — Ambulatory Visit: Payer: PPO | Attending: Cardiovascular Disease | Admitting: Cardiovascular Disease

## 2023-02-24 ENCOUNTER — Encounter: Payer: Self-pay | Admitting: Cardiovascular Disease

## 2023-02-24 VITALS — BP 130/70 | HR 72 | Ht 66.0 in | Wt 252.2 lb

## 2023-02-24 DIAGNOSIS — I110 Hypertensive heart disease with heart failure: Secondary | ICD-10-CM | POA: Diagnosis not present

## 2023-02-24 DIAGNOSIS — E1169 Type 2 diabetes mellitus with other specified complication: Secondary | ICD-10-CM | POA: Diagnosis not present

## 2023-02-24 DIAGNOSIS — I10 Essential (primary) hypertension: Secondary | ICD-10-CM | POA: Diagnosis not present

## 2023-02-24 DIAGNOSIS — Z79899 Other long term (current) drug therapy: Secondary | ICD-10-CM | POA: Diagnosis not present

## 2023-02-24 DIAGNOSIS — T466X5D Adverse effect of antihyperlipidemic and antiarteriosclerotic drugs, subsequent encounter: Secondary | ICD-10-CM

## 2023-02-24 DIAGNOSIS — I2510 Atherosclerotic heart disease of native coronary artery without angina pectoris: Secondary | ICD-10-CM

## 2023-02-24 DIAGNOSIS — Z794 Long term (current) use of insulin: Secondary | ICD-10-CM | POA: Diagnosis not present

## 2023-02-24 DIAGNOSIS — M791 Myalgia, unspecified site: Secondary | ICD-10-CM

## 2023-02-24 DIAGNOSIS — E785 Hyperlipidemia, unspecified: Secondary | ICD-10-CM

## 2023-02-24 DIAGNOSIS — I4891 Unspecified atrial fibrillation: Secondary | ICD-10-CM

## 2023-02-24 DIAGNOSIS — E11319 Type 2 diabetes mellitus with unspecified diabetic retinopathy without macular edema: Secondary | ICD-10-CM | POA: Diagnosis not present

## 2023-02-24 DIAGNOSIS — I5033 Acute on chronic diastolic (congestive) heart failure: Secondary | ICD-10-CM

## 2023-02-24 DIAGNOSIS — I5022 Chronic systolic (congestive) heart failure: Secondary | ICD-10-CM | POA: Diagnosis not present

## 2023-02-24 MED ORDER — CARVEDILOL 12.5 MG PO TABS: 12.5000 mg | ORAL_TABLET | Freq: Two times a day (BID) | ORAL | 3 refills | Status: DC

## 2023-02-24 NOTE — Patient Instructions (Addendum)
Cardioversion for atrial fibrillation   Medication Instructions:  Coreg up to 12.5 mg twice a day  If you need a refill on your cardiac medications before your next appointment, please call your pharmacy.   Lab work: Your provider would like for you to return in Monday to have the following labs drawn: CBC and BMET.   Please go to the [facility] entrance and check in at the front desk.  You do not need an appointment.  They are open from 7am-6 pm.    Testing/Procedures:      Dear [name]  You are scheduled for a Cardioversion on Friday, July 5 with Dr. Mariah Milling.  Please arrive at the [facility] Entrance of [facility], [address], [place] [address] at 6:30 AM (This is 1 hour(s) prior to your procedure time).  Proceed to the Check-In Desk directly inside the entrance.  Procedure Parking: Use the entrance off of the [address] side of the hospital. Turn right upon entering and follow the driveway to parking that is directly in front of the [facility]. There is no valet parking available at this entrance, however there is an awning directly in front of the [facility] for drop off/ pick up for patients.    DIET:  Nothing to eat or drink after midnight except a sip of water with medications (see medication instructions below)  MEDICATION INSTRUCTIONS: !!IF ANY NEW MEDICATIONS ARE STARTED AFTER TODAY, PLEASE NOTIFY YOUR PROVIDER AS SOON AS POSSIBLE!!  FYI: Medications such as Semaglutide (Ozempic, [place]), Tirzepatide (Mounjaro, Zepbound), Dulaglutide (Trulicity), etc ("GLP1 agonists") AND Canagliflozin (Invokana), Dapagliflozin (Farxiga), Empagliflozin (Jardiance), Ertugliflozin (Steglatro), Bexagliflozin [employer]) or any combination with one of these drugs such as Invokamet (Canagliflozin/Metformin), Synjardy (Empagliflozin/Metformin), etc ("SGLT2 inhibitors") must be held around the time of a procedure. This is not a comprehensive  list of all of these drugs. Please review all of your medications and talk to your provider if you take any one of these. If you are not sure, ask your provider.  HOLD: Dulaglutide (Trulicity) for 7 days prior to the procedure. Last dose on Thursday, June 27.24 HOLD: Empagliflozin [place]) for 3 days prior to the procedure. Last dose on Monday, July 01.  HOLD: All diabetic medication the morning of the procedure. Hold: Lasix and Spironolactone. Continue taking your anticoagulant (blood thinner): Apixaban (Eliquis).  You will need to continue this after your procedure until you are told by your provider that it is safe to stop.     FYI:  For your safety, and to allow [place] to monitor your vital signs accurately during the surgery/procedure we request: If you have artificial nails, gel coating, SNS etc, please have those removed prior to your surgery/procedure. Not having the nail coverings /polish removed may result in cancellation or delay of your surgery/procedure.  You must have a responsible person to drive you home and stay in the waiting area during your procedure. Failure to do so could result in cancellation.  Bring your insurance cards.  *Special Note: Every effort is made to have your procedure done on time. Occasionally there are emergencies that occur at the hospital that may cause delays. Please be patient if a delay does occur.      Follow-Up: At [facility], you and your health needs are our priority.  As part of our continuing mission to provide you with exceptional heart care, we have created designated Provider Care Teams.  These Care  Teams include your primary Cardiologist (physician) and Advanced Practice Providers (APPs -  Physician Assistants and Nurse Practitioners) who all work together to provide you with the care you need, when you need it.  You will need a follow up appointment in 1 month  Providers on your designated Care Team:   Nicolasa Ducking, NP Eula Listen, PA-C Cadence Fransico Michael, [place]  COVID-19 Vaccine Information can be found at: [email or web address] For questions related to vaccine distribution or appointments, please email [email or web address] or call [phone].

## 2023-02-28 ENCOUNTER — Other Ambulatory Visit
Admission: RE | Admit: 2023-02-28 | Discharge: 2023-02-28 | Disposition: A | Discharge status: Home / Self Care | Payer: PPO | Attending: Cardiovascular Disease | Admitting: Cardiovascular Disease

## 2023-02-28 ENCOUNTER — Encounter: Payer: PPO | Attending: Physician Assistant | Admitting: Physician Assistant

## 2023-02-28 DIAGNOSIS — I480 Paroxysmal atrial fibrillation: Secondary | ICD-10-CM | POA: Insufficient documentation

## 2023-02-28 DIAGNOSIS — I2510 Atherosclerotic heart disease of native coronary artery without angina pectoris: Secondary | ICD-10-CM | POA: Insufficient documentation

## 2023-02-28 DIAGNOSIS — Z79899 Other long term (current) drug therapy: Secondary | ICD-10-CM | POA: Insufficient documentation

## 2023-02-28 DIAGNOSIS — E1140 Type 2 diabetes mellitus with diabetic neuropathy, unspecified: Secondary | ICD-10-CM | POA: Insufficient documentation

## 2023-02-28 DIAGNOSIS — E1151 Type 2 diabetes mellitus with diabetic peripheral angiopathy without gangrene: Secondary | ICD-10-CM | POA: Insufficient documentation

## 2023-02-28 DIAGNOSIS — E11621 Type 2 diabetes mellitus with foot ulcer: Secondary | ICD-10-CM | POA: Diagnosis not present

## 2023-02-28 DIAGNOSIS — I509 Heart failure, unspecified: Secondary | ICD-10-CM | POA: Insufficient documentation

## 2023-02-28 DIAGNOSIS — I110 Hypertensive heart disease with heart failure: Secondary | ICD-10-CM | POA: Diagnosis not present

## 2023-02-28 DIAGNOSIS — Z7901 Long term (current) use of anticoagulants: Secondary | ICD-10-CM | POA: Diagnosis not present

## 2023-02-28 DIAGNOSIS — L97512 Non-pressure chronic ulcer of other part of right foot with fat layer exposed: Secondary | ICD-10-CM | POA: Diagnosis not present

## 2023-02-28 DIAGNOSIS — L97412 Non-pressure chronic ulcer of right heel and midfoot with fat layer exposed: Secondary | ICD-10-CM | POA: Diagnosis not present

## 2023-02-28 LAB — CBC
HCT: 40.6 % (ref 39.0–52.0)
Hemoglobin: 12.7 g/dL — ABNORMAL LOW (ref 13.0–17.0)
MCH: 27.5 pg (ref 26.0–34.0)
MCHC: 31.3 g/dL (ref 30.0–36.0)
MCV: 87.9 fL (ref 80.0–100.0)
Platelets: 248 10*3/uL (ref 150–400)
RBC: 4.62 MIL/uL (ref 4.22–5.81)
RDW: 17.2 % — ABNORMAL HIGH (ref 11.5–15.5)
WBC: 8.2 10*3/uL (ref 4.0–10.5)
nRBC: 0 % (ref 0.0–0.2)

## 2023-02-28 LAB — BASIC METABOLIC PANEL
Anion gap: 8 (ref 5–15)
BUN: 27 mg/dL — ABNORMAL HIGH (ref 8–23)
CO2: 27 mmol/L (ref 22–32)
Calcium: 8.6 mg/dL — ABNORMAL LOW (ref 8.9–10.3)
Chloride: 103 mmol/L (ref 98–111)
Creatinine, Ser: 1.01 mg/dL (ref 0.61–1.24)
GFR, Estimated: >60 mL/min (ref >60)
Glucose, Bld: 152 mg/dL — ABNORMAL HIGH (ref 70–99)
Potassium: 4.2 mmol/L (ref 3.5–5.1)
Sodium: 138 mmol/L (ref 135–145)

## 2023-02-28 NOTE — Progress Notes (Addendum)
OCTAVIEN, FAGGART ([id number]) [id number] Page 1 of 12 Visit Report for 02/28/2023 Arrival Information Details Patient Name: Date of Service: [name] [facility] [name]. 02/28/2023 1:15 PM Medical Record Number: [id number] Patient Account Number: [id number] Date of Birth/Sex: Treating RN: Jan 29, 1951 (72 y.o. [name] Primary Care Lonzie Simmer: Kerby Nora Other Clinician: Referring Timber Marshman: Treating Shima Compere/Extender: [name], [name] in Treatment: 4 Visit Information History Since Last Visit Added or deleted any medications: No Patient Arrived: Cane Any new allergies or adverse reactions: No Arrival Time: 13:56 Has Dressing in Place as Prescribed: Yes Accompanied By: self Pain Present Now: No Transfer Assistance: None Patient Identification Verified: Yes Secondary Verification Process Completed: Yes Patient Requires Transmission-Based Precautions: No Patient Has Alerts: Yes Patient Alerts: Patient on Blood Thinner Diabetes type 2 Eliquis/Plavix ABI R 1.23 TBI 0.57 ABI L 0.98 TBI 0.80 Electronic Signature(s) Signed: 03/06/2023 5:27:27 PM By: Midge Aver MSN RN CNS WTA Entered By: Midge Aver on 02/28/2023 13:59:48 -------------------------------------------------------------------------------- Clinic Level of Care Assessment Details Patient Name: Date of Service: [name] [name] 02/28/2023 1:15 PM Medical Record Number: [id number] Patient Account Number: [id number] Date of Birth/Sex: Treating RN: Dec 25, 1950 (72 y.o. [name] Primary Care De Libman: Kerby Nora Other Clinician: Referring Mariyam Remington: Treating Leialoha Hanna/Extender: [name], [name] in Treatment: 4 Clinic Level of Care Assessment Items TOOL 1 Quantity Score []  - 0 Use when EandM and Procedure is performed on INITIAL visit ASSESSMENTS - Nursing Assessment / Reassessment []  - 0 General Physical Exam (combine w/ comprehensive assessment (listed just below) when  performed on new pt. evals) []  - 0 Comprehensive Assessment (HX, ROS, Risk Assessments, Wounds Hx, etc.) [name], [name] ([id number]) [id number] Page 2 of 12 ASSESSMENTS - Wound and Skin Assessment / Reassessment []  - 0 Dermatologic / Skin Assessment (not related to wound area) ASSESSMENTS - Ostomy and/or Continence Assessment and Care []  - 0 Incontinence Assessment and Management []  - 0 Ostomy Care Assessment and Management (repouching, etc.) PROCESS - Coordination of Care []  - 0 Simple Patient / Family Education for ongoing care []  - 0 Complex (extensive) Patient / Family Education for ongoing care []  - 0 Staff obtains Chiropractor, Records, T Results / Process Orders est []  - 0 Staff telephones HHA, Nursing Homes / Clarify orders / etc []  - 0 Routine Transfer to another Facility (non-emergent condition) []  - 0 Routine Hospital Admission (non-emergent condition) []  - 0 New Admissions / Manufacturing engineer / Ordering NPWT Apligraf, etc. , []  - 0 Emergency Hospital Admission (emergent condition) PROCESS - Special Needs []  - 0 Pediatric / Minor Patient Management []  - 0 Isolation Patient Management []  - 0 Hearing / Language / Visual special needs []  - 0 Assessment of Community assistance (transportation, D/C planning, etc.) []  - 0 Additional assistance / Altered mentation []  - 0 Support Surface(s) Assessment (bed, cushion, seat, etc.) INTERVENTIONS - Miscellaneous []  - 0 External ear exam []  - 0 Patient Transfer (multiple staff / Nurse, adult / Similar devices) []  - 0 Simple Staple / Suture removal (25 or less) []  - 0 Complex Staple / Suture removal (26 or more) []  - 0 Hypo/Hyperglycemic Management (do not check if billed separately) []  - 0 Ankle / Brachial Index (ABI) - do not check if billed separately Has the patient been seen at the hospital within the last three years: Yes Total Score: 0 Level Of Care: ____ Electronic  Signature(s) Signed: 03/06/2023 5:27:27 PM By: Midge Aver MSN RN CNS WTA Entered By: Midge Aver  on 02/28/2023 16:29:58 -------------------------------------------------------------------------------- Encounter Discharge Information Details Patient Name: Date of Service: [name] [facility] [name]. 02/28/2023 1:15 PM Medical Record Number: [id number] Patient Account Number: [id number] Date of Birth/Sex: Treating RN: [birth year] (72 y.o. [name] Primary Care Matison Nuccio: Kerby Nora Other Clinician: Referring Kamyla Olejnik: Treating Vylet Maffia/Extender: [name], [name] in Treatment: [address] [name], [name] ([id number]) [id number] Page 3 of 12 Encounter Discharge Information Items Post Procedure Vitals Discharge Condition: Stable Temperature (F): 97.8 Ambulatory Status: Ambulatory Pulse (bpm): 72 Discharge Destination: Home Respiratory Rate (breaths/min): 18 Transportation: Private Auto Blood Pressure (mmHg): 122/79 Accompanied By: self Schedule Follow-up Appointment: Yes Clinical Summary of Care: Electronic Signature(s) Signed: 02/28/2023 4:32:11 PM By: Midge Aver MSN RN CNS WTA Entered By: Midge Aver on 02/28/2023 16:32:11 -------------------------------------------------------------------------------- Lower Extremity Assessment Details Patient Name: Date of Service: [name] [name] 02/28/2023 1:15 PM Medical Record Number: [id number] Patient Account Number: [id number] Date of Birth/Sex: Treating RN: [birth year] (72 y.o. [name] Primary Care Tuwanna Krausz: Kerby Nora Other Clinician: Referring Xylina Rhoads: Treating Donna Snooks/Extender: [name], [name] in Treatment: 4 Edema Assessment Assessed: [Left: No] [Right: Yes] [Left: Edema] [Right: :] Calf Left: Right: Point of Measurement: 36 cm From Medial Instep 40 cm Ankle Left: Right: Point of Measurement: 18 cm From Medial Instep 24 cm Knee To Floor Left: Right: From Medial Instep 40  cm Electronic Signature(s) Signed: 03/06/2023 5:27:27 PM By: Midge Aver MSN RN CNS WTA Entered By: Midge Aver on 02/28/2023 14:15:24 [name] ([id number]) [id number] Page 4 of 12 -------------------------------------------------------------------------------- Multi Wound Chart Details Patient Name: Date of Service: [name] [facility] [name]. 02/28/2023 1:15 PM Medical Record Number: [id number] Patient Account Number: [id number] Date of Birth/Sex: Treating RN: 05-22-1951 (72 y.o. [name] Primary Care Andrian Sabala: Kerby Nora Other Clinician: Referring Zani Kyllonen: Treating Dohn Stclair/Extender: [name], [name] in Treatment: 4 Vital Signs Height(in): 66 Pulse(bpm): 72 Weight(lbs): 250 Blood Pressure(mmHg): 122/79 Body Mass Index(BMI): 40.3 Temperature(F): 97.8 Respiratory Rate(breaths/min): 18 [1:Photos:] Right Calcaneus Right, Distal, Dorsal Foot Right, Proximal, Dorsal Foot Wound Location: Gradually Appeared Gradually Appeared Gradually Appeared Wounding Event: Diabetic Wound/Ulcer of the Lower Diabetic Wound/Ulcer of the Lower Diabetic Wound/Ulcer of the Lower Primary Etiology: Extremity Extremity Extremity Congestive Heart Failure, Coronary Congestive Heart Failure, Coronary Congestive Heart Failure, Coronary Comorbid History: Artery Disease, Hypertension, Type II Artery Disease, Hypertension, Type II Artery Disease, Hypertension, Type II Diabetes, Neuropathy Diabetes, Neuropathy Diabetes, Neuropathy 12/10/2022 12/10/2022 12/10/2022 Date Acquired: 4 4 4  Weeks of Treatment: Open Open Open Wound Status: No No No Wound Recurrence: Yes No No Clustered Wound: 3.3x0.8x0.2 0.37x1x0.2 1.5x1.5x0.3 Measurements L x W x D (cm) 2.073 0.291 1.767 A (cm) : rea 0.415 0.058 0.53 Volume (cm) : 51.50% 66.30% 29.70% % Reduction in A rea: 2.80% 32.60% -111.20% % Reduction in Volume: Grade 1 Grade 1 Grade 1 Classification: Medium Medium  Medium Exudate A mount: Serosanguineous Serosanguineous Serosanguineous Exudate Type: red, brown red, brown red, brown Exudate Color: Medium (34-66%) None Present (0%) None Present (0%) Granulation A mount: Red N/A N/A Granulation Quality: None Present (0%) None Present (0%) Large (67-100%) Necrotic A mount: Fat Layer (Subcutaneous Tissue): Yes Fat Layer (Subcutaneous Tissue): Yes Fat Layer (Subcutaneous Tissue): Yes Exposed Structures: Fascia: No Fascia: No Fascia: No Tendon: No Tendon: No Tendon: No Muscle: No Muscle: No Muscle: No Joint: No Joint: No Joint: No Bone: No Bone: No Bone: No None Small (1-33%) Small (1-33%) Epithelialization: Debridement - Excisional Debridement - Excisional Debridement - Excisional Debridement: Pre-procedure Verification/Time  Out 14:29 14:29 14:29 Taken: Lidocaine 4% Topical Solution Lidocaine 4% Topical Solution Lidocaine 4% Topical Solution Pain Control: Subcutaneous, Slough Subcutaneous, Slough Subcutaneous, Slough Tissue Debrided: Skin/Subcutaneous Tissue Skin/Subcutaneous Tissue Skin/Subcutaneous Tissue Level: 2.07 0.24 1.77 Debridement A (sq cm): rea Curette Curette Curette Instrument: Moderate Moderate Moderate Bleeding: Pressure Pressure Pressure Hemostasis A chieved: 3 3 3  Procedural Pain: 3 3 3  Post Procedural Pain: Procedure was tolerated well Procedure was tolerated well Procedure was tolerated well Debridement Treatment Response: 3.3x0.8x0.2 0.3x1x0.3 1.5x1.5x0.4 Post Debridement Measurements L x W x D (cm) 0.415 0.071 0.707 Post Debridement Volume: (cm) Debridement Debridement Debridement Procedures Performed: Treatment Notes [name], [name] ([id number]) [id number] Page 5 of 12 Electronic Signature(s) Signed: 02/28/2023 4:29:25 PM By: Midge Aver MSN RN CNS WTA Entered By: Midge Aver on 02/28/2023  16:29:25 -------------------------------------------------------------------------------- Multi-Disciplinary Care Plan Details Patient Name: Date of Service: [name] [name] 02/28/2023 1:15 PM Medical Record Number: [id number] Patient Account Number: [id number] Date of Birth/Sex: Treating RN: May 02, 1951 (72 y.o. [name] Primary Care Waleska Buttery: Kerby Nora Other Clinician: Referring Burdell Peed: Treating Johnanna Bakke/Extender: [name], [name] in Treatment: 4 Active Inactive Necrotic Tissue Nursing Diagnoses: Impaired tissue integrity related to necrotic/devitalized tissue Knowledge deficit related to management of necrotic/devitalized tissue Goals: Necrotic/devitalized tissue will be minimized in the wound bed Date Initiated: 02/01/2023 Date Inactivated: 02/28/2023 Target Resolution Date: 03/04/2023 Goal Status: Met Patient/caregiver will verbalize understanding of reason and process for debridement of necrotic tissue Date Initiated: 02/01/2023 Target Resolution Date: 03/04/2023 Goal Status: Active Interventions: Assess patient pain level pre-, during and post procedure and prior to discharge Provide education on necrotic tissue and debridement process Treatment Activities: Apply topical anesthetic as ordered : 01/31/2023 Notes: Wound/Skin Impairment Nursing Diagnoses: Impaired tissue integrity Knowledge deficit related to ulceration/compromised skin integrity Goals: Patient/caregiver will verbalize understanding of skin care regimen Date Initiated: 02/01/2023 Date Inactivated: 02/28/2023 Target Resolution Date: 03/04/2023 Goal Status: Met Ulcer/skin breakdown will have a volume reduction of 30% by week 4 Date Initiated: 02/01/2023 Date Inactivated: 02/28/2023 Target Resolution Date: 03/04/2023 Goal Status: Met Ulcer/skin breakdown will have a volume reduction of 50% by week 8 Date Initiated: 02/01/2023 Target Resolution Date: 04/04/2023 Goal Status: Active Ulcer/skin breakdown  will have a volume reduction of 80% by week 12 Date Initiated: 02/01/2023 Target Resolution Date: 05/05/2023 Goal Status: Active Interventions: Assess patient/caregiver ability to obtain necessary supplies Assess patient/caregiver ability to perform ulcer/skin care regimen upon admission and as needed [name], [name] ([id number]) [id number] Page 6 of 12 Assess ulceration(s) every visit Provide education on ulcer and skin care Treatment Activities: Referred to DME Madoline Bhatt for dressing supplies : 01/31/2023 Skin care regimen initiated : 01/31/2023 Notes: Electronic Signature(s) Signed: 02/28/2023 4:31:03 PM By: Midge Aver MSN RN CNS WTA Entered By: Midge Aver on 02/28/2023 16:31:03 -------------------------------------------------------------------------------- Pain Assessment Details Patient Name: Date of Service: [name] [name] 02/28/2023 1:15 PM Medical Record Number: [id number] Patient Account Number: [id number] Date of Birth/Sex: Treating RN: May 04, 1951 (72 y.o. [name] Primary Care Eamonn Sermeno: Kerby Nora Other Clinician: Referring Merinda Victorino: Treating Kregg Cihlar/Extender: [name], [name] in Treatment: 4 Active Problems Location of Pain Severity and Description of Pain Patient Has Paino No Site Locations Pain Management and Medication Current Pain Management: Electronic Signature(s) Signed: 03/06/2023 5:27:27 PM By: Midge Aver MSN RN CNS WTA Entered By: Midge Aver on 02/28/2023 14:02:25 [name] ([id number]) [id number] Page 7 of 12 -------------------------------------------------------------------------------- Patient/Caregiver Education Details Patient Name: Date of Service: [name] [name] 7/2/2024andnbsp1:15  PM Medical Record Number: [id number] Patient Account Number: [id number] Date of Birth/Gender: Treating RN: [birth year] (72 y.o. [name] Primary Care Physician: Kerby Nora Other  Clinician: Referring Physician: Treating Physician/Extender: [name], [name] in Treatment: 4 Education Assessment Education Provided To: Patient Education Topics Provided Wound Debridement: Handouts: Wound Debridement Methods: Explain/Verbal Responses: State content correctly Wound/Skin Impairment: Handouts: Caring for Your Ulcer Methods: Explain/Verbal Responses: State content correctly Electronic Signature(s) Signed: 03/06/2023 5:27:27 PM By: Midge Aver MSN RN CNS WTA Entered By: Midge Aver on 02/28/2023 16:31:09 -------------------------------------------------------------------------------- Wound Assessment Details Patient Name: Date of Service: [name] [name] 02/28/2023 1:15 PM Medical Record Number: [id number] Patient Account Number: [id number] Date of Birth/Sex: Treating RN: 1950/09/22 (72 y.o. [name] Primary Care Quartez Lagos: Kerby Nora Other Clinician: Referring Lex Linhares: Treating Brent Taillon/Extender: [name], [name] in Treatment: 4 Wound Status Wound Number: 1 Primary Diabetic Wound/Ulcer of the Lower Extremity Etiology: Wound Location: Right Calcaneus Wound Open Wounding Event: Gradually Appeared Status: Date Acquired: 12/10/2022 Comorbid Congestive Heart Failure, Coronary Artery Disease, Weeks Of Treatment: 4 History: Hypertension, Type II Diabetes, Neuropathy Clustered Wound: [name], [name] ([id number]) [id number] Page 8 of 12 Photos Wound Measurements Length: (cm) 3.3 Width: (cm) 0.8 Depth: (cm) 0.2 Area: (cm) 2.073 Volume: (cm) 0.415 % Reduction in Area: 51.5% % Reduction in Volume: 2.8% Epithelialization: None Wound Description Classification: Grade 1 Exudate Amount: Medium Exudate Type: Serosanguineous Exudate Color: red, brown Foul Odor After Cleansing: No Slough/Fibrino No Wound Bed Granulation Amount: Medium (34-66%) Exposed Structure Granulation Quality: Red Fascia  Exposed: No Necrotic Amount: None Present (0%) Fat Layer (Subcutaneous Tissue) Exposed: Yes Tendon Exposed: No Muscle Exposed: No Joint Exposed: No Bone Exposed: No Treatment Notes Wound #1 (Calcaneus) Wound Laterality: Right Cleanser Byram Ancillary Kit - 15 Day Supply Discharge Instruction: Use supplies as instructed; Kit contains: (15) Saline Bullets; (15) 3x3 Gauze; 15 pr Gloves Soap and Water Discharge Instruction: Gently cleanse wound with antibacterial soap, rinse and pat dry prior to dressing wounds Peri-Wound Care Topical Primary Dressing IODOFLEX 0.9% Cadexomer Iodine Pad Discharge Instruction: Apply Iodoflex to wound bed only as directed. Secondary Dressing (BORDER) Zetuvit Plus SILICONE BORDER Dressing 4x4 (in/in) Discharge Instruction: Please do not put silicone bordered dressings under wraps. Use non-bordered dressing only. Secured With Compression Wrap Compression Stockings Facilities manager) Signed: 03/06/2023 5:27:27 PM By: Midge Aver MSN RN CNS WTA Entered By: Midge Aver on 02/28/2023 14:13:57 [name], [name] ([id number]) [id number] Page 9 of 12 -------------------------------------------------------------------------------- Wound Assessment Details Patient Name: Date of Service: [name] [facility] [name]. 02/28/2023 1:15 PM Medical Record Number: [id number] Patient Account Number: [id number] Date of Birth/Sex: Treating RN: Jul 13, 1951 (72 y.o. [name] Primary Care Perrin Gens: Kerby Nora Other Clinician: Referring Janie Capp: Treating Kree Armato/Extender: [name], [name] in Treatment: 4 Wound Status Wound Number: 2 Primary Diabetic Wound/Ulcer of the Lower Extremity Etiology: Wound Location: Right, Distal, Dorsal Foot Wound Open Wounding Event: Gradually Appeared Status: Date Acquired: 12/10/2022 Comorbid Congestive Heart Failure, Coronary Artery Disease, Weeks Of Treatment: 4 History: Hypertension, Type  II Diabetes, Neuropathy Clustered Wound: No Photos Wound Measurements Length: (cm) 0.37 Width: (cm) 1 Depth: (cm) 0.2 Area: (cm) 0.291 Volume: (cm) 0.058 % Reduction in Area: 66.3% % Reduction in Volume: 32.6% Epithelialization: Small (1-33%) Wound Description Classification: Grade 1 Exudate Amount: Medium Exudate Type: Serosanguineous Exudate Color: red, brown Foul Odor After Cleansing: No Slough/Fibrino No Wound Bed Granulation Amount: None Present (0%) Exposed Structure Necrotic Amount: None Present (0%) Fascia  Exposed: No Fat Layer (Subcutaneous Tissue) Exposed: Yes Tendon Exposed: No Muscle Exposed: No Joint Exposed: No Bone Exposed: No Treatment Notes Wound #2 (Foot) Wound Laterality: Dorsal, Right, Distal Cleanser Byram Ancillary Kit - 15 Day Supply Discharge Instruction: Use supplies as instructed; Kit contains: (15) Saline Bullets; (15) 3x3 Gauze; [address] JEF, HOGUE ([id number]) [id number] Page 10 of 12 Discharge Instruction: Gently cleanse wound with antibacterial soap, rinse and pat dry prior to dressing wounds Peri-Wound Care Topical Primary Dressing IODOFLEX 0.9% Cadexomer Iodine Pad Discharge Instruction: Apply Iodoflex to wound bed only as directed. Secondary Dressing (BORDER) Zetuvit Plus SILICONE BORDER Dressing 4x4 (in/in) Discharge Instruction: Please do not put silicone bordered dressings under wraps. Use non-bordered dressing only. Secured With Compression Wrap Compression Stockings Facilities manager) Signed: 03/06/2023 5:27:27 PM By: Midge Aver MSN RN CNS WTA Entered By: Midge Aver on 02/28/2023 14:13:28 -------------------------------------------------------------------------------- Wound Assessment Details Patient Name: Date of Service: [name] [name] 02/28/2023 1:15 PM Medical Record Number: [id number] Patient Account Number: [id number] Date of Birth/Sex: Treating  RN: 1950-11-21 (72 y.o. [name] Primary Care Beila Purdie: Kerby Nora Other Clinician: Referring Raif Chachere: Treating Asyria Kolander/Extender: [name], [name] in Treatment: 4 Wound Status Wound Number: 3 Primary Diabetic Wound/Ulcer of the Lower Extremity Etiology: Wound Location: Right, Proximal, Dorsal Foot Wound Open Wounding Event: Gradually Appeared Status: Date Acquired: 12/10/2022 Comorbid Congestive Heart Failure, Coronary Artery Disease, Weeks Of Treatment: 4 History: Hypertension, Type II Diabetes, Neuropathy Clustered Wound: No Photos Wound Measurements Length: (cm) 1.5 Width: (cm) 1.5 Depth: (cm) 0.3 Area: (cm) 1.767 [name], [name] ([id number]) Volume: (cm) 0.53 % Reduction in Area: 29.7% % Reduction in Volume: -111.2% Epithelialization: Small (1-33%) [id number] Page 11 of 12 Wound Description Classification: Grade 1 Exudate Amount: Medium Exudate Type: Serosanguineous Exudate Color: red, brown Foul Odor After Cleansing: No Slough/Fibrino Yes Wound Bed Granulation Amount: None Present (0%) Exposed Structure Necrotic Amount: Large (67-100%) Fascia Exposed: No Necrotic Quality: Adherent Slough Fat Layer (Subcutaneous Tissue) Exposed: Yes Tendon Exposed: No Muscle Exposed: No Joint Exposed: No Bone Exposed: No Treatment Notes Wound #3 (Foot) Wound Laterality: Dorsal, Right, Proximal Cleanser Soap and Water Discharge Instruction: Gently cleanse wound with antibacterial soap, rinse and pat dry prior to dressing wounds Peri-Wound Care Topical Primary Dressing IODOFLEX 0.9% Cadexomer Iodine Pad Discharge Instruction: Apply Iodoflex to wound bed only as directed. Secondary Dressing (BORDER) Zetuvit Plus SILICONE BORDER Dressing 4x4 (in/in) Discharge Instruction: Please do not put silicone bordered dressings under wraps. Use non-bordered dressing only. Secured With Compression Wrap Compression  Stockings Facilities manager) Signed: 03/06/2023 5:27:27 PM By: Midge Aver MSN RN CNS WTA Entered By: Midge Aver on 02/28/2023 14:14:32 -------------------------------------------------------------------------------- Vitals Details Patient Name: Date of Service: [name] [name] 02/28/2023 1:15 PM Medical Record Number: [id number] Patient Account Number: [id number] Date of Birth/Sex: Treating RN: Sep 08, 1950 (72 y.o. [name] Primary Care Dayanara Sherrill: Kerby Nora Other Clinician: Referring Tashiba Timoney: Treating Tj Kitchings/Extender: [name], [name] in Treatment: 4 Vital Signs Time Taken: 14:01 Temperature (F): 97.8 Height (in): 66 Pulse (bpm): 72 [name], [name] ([id number]) [id number] Page 12 of 12 Weight (lbs): 250 Respiratory Rate (breaths/min): 18 Body Mass Index (BMI): 40.3 Blood Pressure (mmHg): 122/79 Reference Range: 80 - 120 mg / dl Electronic Signature(s) Signed: 03/06/2023 5:27:27 PM By: Midge Aver MSN RN CNS WTA Entered By: Midge Aver on 02/28/2023 14:02:19

## 2023-02-28 NOTE — Progress Notes (Addendum)
[name], [name] ([id number]) [id number] Page 1 of 10 Visit Report for 02/28/2023 Chief Complaint Document Details Patient Name: Date of Service: [name] [facility] H. 02/28/2023 1:15 PM Medical Record Number: [id number] Patient Account Number: [id number] Date of Birth/Sex: Treating RN: [birth year] (72 y.o. [name] Primary Care Provider: Kerby [name] Other Clinician: Referring Provider: Treating Provider/Extender: [name], [name] in Treatment: 4 Information Obtained from: Patient Chief Complaint Right foot ulcers Electronic Signature(s) Signed: 02/28/2023 1:26:59 PM By: William Derry PA-C Entered By: [name] on 02/28/2023 13:26:59 -------------------------------------------------------------------------------- Debridement Details Patient Name: Date of Service: [name] [facility] H. 02/28/2023 1:15 PM Medical Record Number: [id number] Patient Account Number: [id number] Date of Birth/Sex: Treating RN: [name] (72 y.o. [name] Primary Care Provider: Kerby [name] Other Clinician: Referring Provider: Treating Provider/Extender: [name], [name] in Treatment: 4 Debridement Performed for Assessment: Wound #3 Right,Proximal,Dorsal Foot Performed By: Physician William Derry, PA-C Debridement Type: Debridement Severity of Tissue Pre Debridement: Fat layer exposed Level of Consciousness (Pre-procedure): Awake and Alert Pre-procedure Verification/Time Out Yes - 14:29 Taken: Start Time: 14:29 Pain Control: Lidocaine 4% T opical Solution Percent of Wound Bed Debrided: 100% T Area Debrided (cm): otal 1.77 Tissue and other material debrided: Viable, Non-Viable, Slough, Subcutaneous, Slough Level: Skin/Subcutaneous Tissue Debridement Description: Excisional Instrument: Curette Bleeding: Moderate Hemostasis Achieved: Pressure Procedural Pain: 3 Post Procedural Pain: 491 [name] ([id number])  [id number] Page 2 of 10 Response to Treatment: Procedure was tolerated well Level of Consciousness (Post- Awake and Alert procedure): Post Debridement Measurements of Total Wound Length: (cm) 1.5 Width: (cm) 1.5 Depth: (cm) 0.4 Volume: (cm) 0.707 Character of Wound/Ulcer Post Debridement: Stable Severity of Tissue Post Debridement: Fat layer exposed Post Procedure Diagnosis Same as Pre-procedure Electronic Signature(s) Signed: 02/28/2023 5:13:07 PM By: William Derry PA-C Signed: 03/06/2023 5:27:27 PM By: William Aver MSN RN CNS WTA Entered By: [name] on 02/28/2023 14:30:30 -------------------------------------------------------------------------------- Debridement Details Patient Name: Date of Service: [name] HN H. 02/28/2023 1:15 PM Medical Record Number: [id number] Patient Account Number: [id number] Date of Birth/Sex: Treating RN: [name] (72 y.o. [name] Primary Care Provider: Kerby [name] Other Clinician: Referring Provider: Treating Provider/Extender: [name], [name] in Treatment: 4 Debridement Performed for Assessment: Wound #2 Right,Distal,Dorsal Foot Performed By: Physician William Derry, PA-C Debridement Type: Debridement Severity of Tissue Pre Debridement: Fat layer exposed Level of Consciousness (Pre-procedure): Awake and Alert Pre-procedure Verification/Time Out Yes - 14:29 Taken: Start Time: 14:29 Pain Control: Lidocaine 4% T opical Solution Percent of Wound Bed Debrided: 100% T Area Debrided (cm): otal 0.24 Tissue and other material debrided: Viable, Non-Viable, Slough, Subcutaneous, Slough Level: Skin/Subcutaneous Tissue Debridement Description: Excisional Instrument: Curette Bleeding: Moderate Hemostasis Achieved: Pressure Procedural Pain: 3 Post Procedural Pain: 3 Response to Treatment: Procedure was tolerated well Level of Consciousness (Post- Awake and Alert procedure): Post Debridement  Measurements of Total Wound Length: (cm) 0.3 Width: (cm) 1 Depth: (cm) 0.3 Volume: (cm) 0.071 Character of Wound/Ulcer Post Debridement: Stable Severity of Tissue Post Debridement: Fat layer exposed Post Procedure Diagnosis Same as [name] ([id number]) [id number] Page 3 of 10 Electronic Signature(s) Signed: 02/28/2023 5:13:07 PM By: William Derry PA-C Signed: 03/06/2023 5:27:27 PM By: William Aver MSN RN CNS WTA Entered By: [name] on 02/28/2023 14:31:18 -------------------------------------------------------------------------------- Debridement Details Patient Name: Date of Service: [name] HN H. 02/28/2023 1:15 PM Medical Record Number: [id number] Patient Account Number: [id number] Date of Birth/Sex: Treating RN: William 12, [name] (72 y.o. [name],  [name] Primary Care Provider: Kerby [name] Other Clinician: Referring Provider: Treating Provider/Extender: [name], [name] in Treatment: 4 Debridement Performed for Assessment: Wound #1 Right Calcaneus Performed By: Physician William Derry, PA-C Debridement Type: Debridement Severity of Tissue Pre Debridement: Fat layer exposed Level of Consciousness (Pre-procedure): Awake and Alert Pre-procedure Verification/Time Out Yes - 14:29 Taken: Start Time: 14:29 Pain Control: Lidocaine 4% T opical Solution Percent of Wound Bed Debrided: 100% T Area Debrided (cm): otal 2.07 Tissue and other material debrided: Viable, Non-Viable, Slough, Subcutaneous, Slough Level: Skin/Subcutaneous Tissue Debridement Description: Excisional Instrument: Curette Bleeding: Moderate Hemostasis Achieved: Pressure Procedural Pain: 3 Post Procedural Pain: 3 Response to Treatment: Procedure was tolerated well Level of Consciousness (Post- Awake and Alert procedure): Post Debridement Measurements of Total Wound Length: (cm) 3.3 Width: (cm) 0.8 Depth: (cm) 0.2 Volume: (cm) 0.415 Character of  Wound/Ulcer Post Debridement: Stable Severity of Tissue Post Debridement: Fat layer exposed Post Procedure Diagnosis Same as Pre-procedure Electronic Signature(s) Signed: 02/28/2023 5:13:07 PM By: William Derry PA-C Signed: 03/06/2023 5:27:27 PM By: William Aver MSN RN CNS WTA Entered By: [name] on 02/28/2023 14:32:13 [name] ([id number]) [id number] Page 4 of 10 -------------------------------------------------------------------------------- HPI Details Patient Name: Date of Service: [name] 02/28/2023 1:15 PM Medical Record Number: [id number] Patient Account Number: [id number] Date of Birth/Sex: Treating RN: [name] (72 y.o. [name] Primary Care Provider: Kerby [name] Other Clinician: Referring Provider: Treating Provider/Extender: [name], [name] in Treatment: 4 History of Present Illness HPI Description: 01-31-2023 upon evaluation today patient appears to be doing poorly currently in regard to wounds on his right heel, right dorsal foot, and right lateral foot. Subsequently he does have known peripheral vascular disease and again this is something that he needs to I think Checked formally. This is what the majority of the conversation today hinged around and the patient voiced understanding as far as that is concerned. Fortunately I do not see any signs of active infection locally nor systemically which is great news. Patient does have a history of diabetes mellitus type 2, atrial fibrillation for which she is on long-term anticoagulant Therapy, hypertension, and coronary artery disease. Patient's hemoglobin A1c most recently was on 11-17-2022 and was 7.2 and currently he is on Eliquis and Plavix. 02-07-2023 upon evaluation today patient appears to be doing well currently in regard to his wounds all things considered I feel like we are still maintaining that does not seem like it is any worse is also not significantly  better. I discussed with the patient that I do believe he would benefit from the arterial evaluation we still need to get this done as quickly as possible and subsequently we did put in a follow-up call with the vascular office today with regard to this. 02-14-2023 upon evaluation today patient appears to be doing a little better in regard to his wounds which are showing signs of loosening which is great news. With that being said he is experiencing an improvement overall in his symptoms and we are still waiting on the vascular evaluation want to get this result and consider whether or not his blood flow is good or not we will be able to make a better determination of next steps. For now we will try and avoid any aggressive sharp debridement to know that he has good arterial flow. 02-21-2023 upon evaluation today patient appears to be doing about the same in regard to his wound. Fortunately there does not appear to be any  signs of active infection at this time which is great news. No fevers, chills, nausea, vomiting, or diarrhea. I did review patient's arterial study and it appears that he has pretty good flow in the left is not perfect but it is decent. On the right however he is definitely not doing nearly as good and I think that he is going to need to see one of the vascular doctors for further evaluation and treatment of this right leg in order to get these wounds to heal. 02-28-2023 upon evaluation today patient appears to be doing well currently in regard to his wound. He has been tolerating the dressing changes without complication. Fortunately I do not see any evidence of active infection locally nor systemically at this time. I do think that the eschar started to soften up and I would like to try to get some of this off to see if we can get things moving in the right direction. He is in agreement with this plan. Electronic Signature(s) Signed: 02/28/2023 2:38:37 PM By: William Derry PA-C Entered  By: [name] on 02/28/2023 14:38:37 -------------------------------------------------------------------------------- Physical Exam Details Patient Name: Date of Service: [name] HN H. 02/28/2023 1:15 PM Medical Record Number: [id number] Patient Account Number: [id number] Date of Birth/Sex: Treating RN: [name] (72 y.o. [name] Primary Care Provider: Kerby [name] Other Clinician: Referring Provider: Treating Provider/Extender: [name], [name] in Treatment: [address] SAMMIE, AKKERMAN ([id number]) [id number] Page 5 of 10 Constitutional Obese and well-hydrated in no acute distress. Respiratory normal breathing without difficulty. Psychiatric this patient is able to make decisions and demonstrates good insight into disease process. Alert and Oriented x 3. pleasant and cooperative. Electronic Signature(s) Signed: 02/28/2023 2:39:27 PM By: William Derry PA-C Entered By: [name] on 02/28/2023 14:39:27 -------------------------------------------------------------------------------- Physician Orders Details Patient Name: Date of Service: [name] HN H. 02/28/2023 1:15 PM Medical Record Number: [id number] Patient Account Number: [id number] Date of Birth/Sex: Treating RN: [name] (72 y.o. [name] Primary Care Provider: Kerby [name] Other Clinician: Referring Provider: Treating Provider/Extender: [name], [name] in Treatment: 4 Verbal / Phone Orders: No Diagnosis Coding ICD-10 Coding Code Description E11.621 Type 2 diabetes mellitus with foot ulcer L97.512 Non-pressure chronic ulcer of other part of right foot with fat layer exposed I48.0 Paroxysmal atrial fibrillation Z79.01 Long term (current) use of anticoagulants I10 Essential (primary) hypertension I25.10 Atherosclerotic heart disease of native coronary artery without angina pectoris Follow-up Appointments Return Appointment in 1 week. Bathing/ [employer] wounds with antibacterial soap and water. Anesthetic (Use 'Patient Medications' Section for Anesthetic Order Entry) Lidocaine applied to wound bed Wound Treatment Wound #1 - Calcaneus Wound Laterality: Right Cleanser: Byram Ancillary Kit - 15 Day Supply (Generic) 3 x Per Week/30 Days Discharge Instructions: Use supplies as instructed; Kit contains: (15) Saline Bullets; (15) 3x3 Gauze; 15 pr Gloves Cleanser: Soap and Water 3 x Per Week/30 Days Discharge Instructions: Gently cleanse wound with antibacterial soap, rinse and pat dry prior to dressing wounds Prim Dressing: IODOFLEX 0.9% Cadexomer Iodine Pad (Generic) 3 x Per Week/30 Days ary Discharge Instructions: Apply Iodoflex to wound bed only as directed. Secondary Dressing: (BORDER) Zetuvit Plus SILICONE BORDER Dressing 4x4 (in/in) (Generic) 3 x Per Week/30 Days Discharge Instructions: Please do not put silicone bordered dressings under wraps. Use non-bordered dressing only. Wound #2 - Foot Wound Laterality: Dorsal, Right, Distal Cleanser: Byram Ancillary Kit - 15 Day Supply (Generic) 3 x Per Week/30 Days GRAE, NISKA ([id number]) [id number] Page  6 of 10 Discharge Instructions: Use supplies as instructed; Kit contains: (15) Saline Bullets; (15) 3x3 Gauze; 15 pr Gloves Cleanser: Soap and Water 3 x Per Week/30 Days Discharge Instructions: Gently cleanse wound with antibacterial soap, rinse and pat dry prior to dressing wounds Prim Dressing: IODOFLEX 0.9% Cadexomer Iodine Pad (Generic) 3 x Per Week/30 Days ary Discharge Instructions: Apply Iodoflex to wound bed only as directed. Secondary Dressing: (BORDER) Zetuvit Plus SILICONE BORDER Dressing 4x4 (in/in) (Generic) 3 x Per Week/30 Days Discharge Instructions: Please do not put silicone bordered dressings under wraps. Use non-bordered dressing only. Wound #3 - Foot Wound Laterality: Dorsal, Right, Proximal Cleanser: Soap and Water 3 x Per Week/30  Days Discharge Instructions: Gently cleanse wound with antibacterial soap, rinse and pat dry prior to dressing wounds Prim Dressing: IODOFLEX 0.9% Cadexomer Iodine Pad (Generic) 3 x Per Week/30 Days ary Discharge Instructions: Apply Iodoflex to wound bed only as directed. Secondary Dressing: (BORDER) Zetuvit Plus SILICONE BORDER Dressing 4x4 (in/in) (Generic) 3 x Per Week/30 Days Discharge Instructions: Please do not put silicone bordered dressings under wraps. Use non-bordered dressing only. Electronic Signature(s) Signed: 02/28/2023 5:13:07 PM By: William Derry PA-C Signed: 03/06/2023 5:27:27 PM By: William Aver MSN RN CNS WTA Entered By: [name] on 02/28/2023 16:29:47 -------------------------------------------------------------------------------- Problem List Details Patient Name: Date of Service: [name] HN H. 02/28/2023 1:15 PM Medical Record Number: [id number] Patient Account Number: [id number] Date of Birth/Sex: Treating RN: [birth year] (72 y.o. [name] Primary Care Provider: Kerby [name] Other Clinician: Referring Provider: Treating Provider/Extender: [name], [name] in Treatment: 4 Active Problems ICD-10 Encounter Code Description Active Date MDM Diagnosis E11.621 Type 2 diabetes mellitus with foot ulcer 01/31/2023 No Yes L97.512 Non-pressure chronic ulcer of other part of right foot with fat layer exposed 01/31/2023 No Yes I48.0 Paroxysmal atrial fibrillation 01/31/2023 No Yes Z79.01 Long term (current) use of anticoagulants 01/31/2023 No Yes I10 Essential (primary) hypertension 01/31/2023 No Yes AAVEN, BIGGIO ([id number]) [id number] Page 7 of 10 I25.10 Atherosclerotic heart disease of native coronary artery without angina pectoris 01/31/2023 No Yes Inactive Problems Resolved Problems Electronic Signature(s) Signed: 02/28/2023 1:26:55 PM By: William Derry PA-C Entered By: [name] on 02/28/2023  13:26:55 -------------------------------------------------------------------------------- Progress Note Details Patient Name: Date of Service: [name] HN H. 02/28/2023 1:15 PM Medical Record Number: [id number] Patient Account Number: [id number] Date of Birth/Sex: Treating RN: [name] (72 y.o. [name] Primary Care Provider: Kerby [name] Other Clinician: Referring Provider: Treating Provider/Extender: [name], [name] in Treatment: 4 Subjective Chief Complaint Information obtained from Patient Right foot ulcers History of Present Illness (HPI) 01-31-2023 upon evaluation today patient appears to be doing poorly currently in regard to wounds on his right heel, right dorsal foot, and right lateral foot. Subsequently he does have known peripheral vascular disease and again this is something that he needs to I think Checked formally. This is what the majority of the conversation today hinged around and the patient voiced understanding as far as that is concerned. Fortunately I do not see any signs of active infection locally nor systemically which is great news. Patient does have a history of diabetes mellitus type 2, atrial fibrillation for which she is on long-term anticoagulant Therapy, hypertension, and coronary artery disease. Patient's hemoglobin A1c most recently was on 11-17-2022 and was 7.2 and currently he is on Eliquis and Plavix. 02-07-2023 upon evaluation today patient appears to be doing well currently in regard to his wounds all things considered I  feel like we are still maintaining that does not seem like it is any worse is also not significantly better. I discussed with the patient that I do believe he would benefit from the arterial evaluation we still need to get this done as quickly as possible and subsequently we did put in a follow-up call with the vascular office today with regard to this. 02-14-2023 upon evaluation today patient appears to be doing a  little better in regard to his wounds which are showing signs of loosening which is great news. With that being said he is experiencing an improvement overall in his symptoms and we are still waiting on the vascular evaluation want to get this result and consider whether or not his blood flow is good or not we will be able to make a better determination of next steps. For now we will try and avoid any aggressive sharp debridement to know that he has good arterial flow. 02-21-2023 upon evaluation today patient appears to be doing about the same in regard to his wound. Fortunately there does not appear to be any signs of active infection at this time which is great news. No fevers, chills, nausea, vomiting, or diarrhea. I did review patient's arterial study and it appears that he has pretty good flow in the left is not perfect but it is decent. On the right however he is definitely not doing nearly as good and I think that he is going to need to see one of the vascular doctors for further evaluation and treatment of this right leg in order to get these wounds to heal. 02-28-2023 upon evaluation today patient appears to be doing well currently in regard to his wound. He has been tolerating the dressing changes without complication. Fortunately I do not see any evidence of active infection locally nor systemically at this time. I do think that the eschar started to soften up and I would like to try to get some of this off to see if we can get things moving in the right direction. He is in agreement with this plan. [address] JSOEPH, ARELLANES ([id number]) [id number] Page 8 of 10 Constitutional Obese and well-hydrated in no acute distress. Vitals Time Taken: 2:01 PM, Height: 66 in, Weight: 250 lbs, BMI: 40.3, Temperature: 97.8 F, Pulse: 72 bpm, Respiratory Rate: 18 breaths/min, Blood Pressure: 122/79 mmHg. Respiratory normal breathing without difficulty. Psychiatric this patient  is able to make decisions and demonstrates good insight into disease process. Alert and Oriented x 3. pleasant and cooperative. Integumentary (Hair, Skin) Wound #1 status is Open. Original cause of wound was Gradually Appeared. The date acquired was: 12/10/2022. The wound has been in treatment 4 [name]. The wound is located on the Right Calcaneus. The wound measures 3.3cm length x 0.8cm width x 0.2cm depth; 2.073cm^2 area and 0.415cm^3 volume. There is Fat Layer (Subcutaneous Tissue) exposed. There is a medium amount of serosanguineous drainage noted. There is medium (34-66%) red granulation within the wound bed. There is no necrotic tissue within the wound bed. Wound #2 status is Open. Original cause of wound was Gradually Appeared. The date acquired was: 12/10/2022. The wound has been in treatment 4 [name]. The wound is located on the Right,Distal,Dorsal Foot. The wound measures 0.37cm length x 1cm width x 0.2cm depth; 0.291cm^2 area and 0.058cm^3 volume. There is Fat Layer (Subcutaneous Tissue) exposed. There is a medium amount of serosanguineous drainage noted. There is no granulation within the wound bed. There is no necrotic tissue within the wound  bed. Wound #3 status is Open. Original cause of wound was Gradually Appeared. The date acquired was: 12/10/2022. The wound has been in treatment 4 [name]. The wound is located on the Right,Proximal,Dorsal Foot. The wound measures 1.5cm length x 1.5cm width x 0.3cm depth; 1.767cm^2 area and 0.53cm^3 volume. There is Fat Layer (Subcutaneous Tissue) exposed. There is a medium amount of serosanguineous drainage noted. There is no granulation within the wound bed. There is a large (67-100%) amount of necrotic tissue within the wound bed including Adherent Slough. Assessment Active Problems ICD-10 Type 2 diabetes mellitus with foot ulcer Non-pressure chronic ulcer of other part of right foot with fat layer exposed Paroxysmal atrial fibrillation Long term  (current) use of anticoagulants Essential (primary) hypertension Atherosclerotic heart disease of native coronary artery without angina pectoris Procedures Wound #1 Pre-procedure diagnosis of Wound #1 is a Diabetic Wound/Ulcer of the Lower Extremity located on the Right Calcaneus .Severity of Tissue Pre Debridement is: Fat layer exposed. There was a Excisional Skin/Subcutaneous Tissue Debridement with a total area of 2.07 sq cm performed by William Derry, PA-C. With the following instrument(s): Curette to remove Viable and Non-Viable tissue/material. Material removed includes Subcutaneous Tissue and Slough and after achieving pain control using Lidocaine 4% T opical Solution. No specimens were taken. A time out was conducted at 14:29, prior to the start of the procedure. A Moderate amount of bleeding was controlled with Pressure. The procedure was tolerated well with a pain level of 3 throughout and a pain level of 3 following the procedure. Post Debridement Measurements: 3.3cm length x 0.8cm width x 0.2cm depth; 0.415cm^3 volume. Character of Wound/Ulcer Post Debridement is stable. Severity of Tissue Post Debridement is: Fat layer exposed. Post procedure Diagnosis Wound #1: Same as Pre-Procedure Wound #2 Pre-procedure diagnosis of Wound #2 is a Diabetic Wound/Ulcer of the Lower Extremity located on the Right,Distal,Dorsal Foot .Severity of Tissue Pre Debridement is: Fat layer exposed. There was a Excisional Skin/Subcutaneous Tissue Debridement with a total area of 0.24 sq cm performed by William Derry, PA-C. With the following instrument(s): Curette to remove Viable and Non-Viable tissue/material. Material removed includes Subcutaneous Tissue and Slough and after achieving pain control using Lidocaine 4% Topical Solution. No specimens were taken. A time out was conducted at 14:29, prior to the start of the procedure. A Moderate amount of bleeding was controlled with Pressure. The procedure was  tolerated well with a pain level of 3 throughout and a pain level of 3 following the procedure. Post Debridement Measurements: 0.3cm length x 1cm width x 0.3cm depth; 0.071cm^3 volume. Character of Wound/Ulcer Post Debridement is stable. Severity of Tissue Post Debridement is: Fat layer exposed. Post procedure Diagnosis Wound #2: Same as Pre-Procedure Wound #3 Pre-procedure diagnosis of Wound #3 is a Diabetic Wound/Ulcer of the Lower Extremity located on the Right,Proximal,Dorsal Foot .Severity of Tissue Pre Debridement is: Fat layer exposed. There was a Excisional Skin/Subcutaneous Tissue Debridement with a total area of 1.77 sq cm performed by William Derry, PA-C. With the following instrument(s): Curette to remove Viable and Non-Viable tissue/material. Material removed includes Subcutaneous Tissue and Slough and after achieving pain control using Lidocaine 4% Topical Solution. No specimens were taken. A time out was conducted at 14:29, prior to the start of the procedure. A Moderate amount of bleeding was controlled with Pressure. The procedure was tolerated well with a pain level of 3 throughout and a pain level of 3 following the procedure. Post Debridement Measurements: 1.5cm length x 1.5cm width x 0.4cm depth;  0.707cm^3 volume. Character of Wound/Ulcer Post Debridement is stable. Severity of Tissue Post Debridement is: Fat layer exposed. Post procedure Diagnosis Wound #3: Same as Pre-Procedure VONTAE, KAPLOWITZ ([id number]) [id number] Page 9 of 10 Plan 1. I would recommend currently that we have the patient continue to monitor for any signs of infection or worsening. 2. I am good recommend as well that we continue with the Iodoflex although this looks much better already. 3. I am also going to suggest that the patient should continue to follow-up with vascular as recommended we are still awaiting referral appointment in the meantime we will keep a close eye on how  things are here although I do feel like getting some of this eschar off is going on the left for better chances for healing here. We will see patient back for reevaluation in 1 week here in the clinic. If anything worsens or changes patient will contact our office for additional recommendations. Electronic Signature(s) Signed: 02/28/2023 2:40:15 PM By: William Derry PA-C Entered By: [name] on 02/28/2023 14:40:15 -------------------------------------------------------------------------------- SuperBill Details Patient Name: Date of Service: [name] HN H. 02/28/2023 Medical Record Number: [id number] Patient Account Number: [id number] Date of Birth/Sex: Treating RN: [birth year] [name] (72 y.o. [name] Primary Care Provider: Kerby [name] Other Clinician: Referring Provider: Treating Provider/Extender: [name], [name] in Treatment: 4 Diagnosis Coding ICD-10 Codes Code Description E11.621 Type 2 diabetes mellitus with foot ulcer L97.512 Non-pressure chronic ulcer of other part of right foot with fat layer exposed I48.0 Paroxysmal atrial fibrillation Z79.01 Long term (current) use of anticoagulants I10 Essential (primary) hypertension I25.10 Atherosclerotic heart disease of native coronary artery without angina pectoris Facility Procedures : CPT4 Code: 78295621 Description: 11042 - DEB SUBQ TISSUE 20 SQ CM/< ICD-10 Diagnosis Description L97.512 Non-pressure chronic ulcer of other part of right foot with fat layer exposed Modifier: Quantity: 1 Physician Procedures : CPT4 Code Description Modifier [id number] 11042 - WC PHYS SUBQ TISS 20 SQ CM ICD-10 Diagnosis Description L97.512 Non-pressure chronic ulcer of other part of right foot with fat layer exposed Quantity: 1 Electronic Signature(s) Signed: 02/28/2023 4:30:09 PM By: William Aver MSN RN CNS WTA Signed: 02/28/2023 5:13:07 PM By: William Derry PA-C Previous Signature: 02/28/2023 2:40:29 PM Version By: William Derry  PA-C Entered By: [name] on 02/28/2023 16:30:08 [name] ([id number]) [id number] Page 10 of 10

## 2023-03-02 ENCOUNTER — Other Ambulatory Visit: Payer: Self-pay | Admitting: Cardiovascular Disease

## 2023-03-02 DIAGNOSIS — I4819 Other persistent atrial fibrillation: Secondary | ICD-10-CM

## 2023-03-02 MED ORDER — SODIUM CHLORIDE 0.9 % IV SOLN: INTRAVENOUS | Status: DC

## 2023-03-03 ENCOUNTER — Ambulatory Visit: Payer: PPO | Admitting: Anesthesiology

## 2023-03-03 ENCOUNTER — Other Ambulatory Visit: Payer: Self-pay

## 2023-03-03 ENCOUNTER — Encounter: Payer: Self-pay | Admitting: Cardiovascular Disease

## 2023-03-03 ENCOUNTER — Encounter
Admission: RE | Disposition: A | Discharge status: Home / Self Care | Payer: Self-pay | Source: Home / Self Care | Attending: Cardiovascular Disease

## 2023-03-03 ENCOUNTER — Ambulatory Visit
Admission: RE | Admit: 2023-03-03 | Discharge: 2023-03-03 | Disposition: A | Discharge status: Home / Self Care | Payer: PPO | Attending: Cardiovascular Disease | Admitting: Cardiovascular Disease

## 2023-03-03 ENCOUNTER — Telehealth: Payer: Self-pay | Admitting: Emergency Medicine

## 2023-03-03 DIAGNOSIS — I5033 Acute on chronic diastolic (congestive) heart failure: Secondary | ICD-10-CM | POA: Diagnosis not present

## 2023-03-03 DIAGNOSIS — I4891 Unspecified atrial fibrillation: Secondary | ICD-10-CM | POA: Diagnosis not present

## 2023-03-03 DIAGNOSIS — R0602 Shortness of breath: Secondary | ICD-10-CM

## 2023-03-03 DIAGNOSIS — E0839 Diabetes mellitus due to underlying condition with other diabetic ophthalmic complication: Secondary | ICD-10-CM | POA: Insufficient documentation

## 2023-03-03 DIAGNOSIS — I509 Heart failure, unspecified: Secondary | ICD-10-CM | POA: Diagnosis not present

## 2023-03-03 DIAGNOSIS — E785 Hyperlipidemia, unspecified: Secondary | ICD-10-CM | POA: Diagnosis not present

## 2023-03-03 DIAGNOSIS — Z955 Presence of coronary angioplasty implant and graft: Secondary | ICD-10-CM | POA: Diagnosis not present

## 2023-03-03 DIAGNOSIS — Z79899 Other long term (current) drug therapy: Secondary | ICD-10-CM | POA: Diagnosis not present

## 2023-03-03 DIAGNOSIS — I4819 Other persistent atrial fibrillation: Secondary | ICD-10-CM | POA: Insufficient documentation

## 2023-03-03 DIAGNOSIS — I110 Hypertensive heart disease with heart failure: Secondary | ICD-10-CM | POA: Diagnosis not present

## 2023-03-03 DIAGNOSIS — Z7985 Long-term (current) use of injectable non-insulin antidiabetic drugs: Secondary | ICD-10-CM | POA: Diagnosis not present

## 2023-03-03 DIAGNOSIS — Z950 Presence of cardiac pacemaker: Secondary | ICD-10-CM | POA: Insufficient documentation

## 2023-03-03 DIAGNOSIS — Z7901 Long term (current) use of anticoagulants: Secondary | ICD-10-CM | POA: Insufficient documentation

## 2023-03-03 DIAGNOSIS — Z6841 Body Mass Index (BMI) 40.0 and over, adult: Secondary | ICD-10-CM | POA: Insufficient documentation

## 2023-03-03 DIAGNOSIS — M1990 Unspecified osteoarthritis, unspecified site: Secondary | ICD-10-CM | POA: Insufficient documentation

## 2023-03-03 DIAGNOSIS — I2510 Atherosclerotic heart disease of native coronary artery without angina pectoris: Secondary | ICD-10-CM | POA: Diagnosis not present

## 2023-03-03 DIAGNOSIS — K219 Gastro-esophageal reflux disease without esophagitis: Secondary | ICD-10-CM | POA: Insufficient documentation

## 2023-03-03 DIAGNOSIS — Z794 Long term (current) use of insulin: Secondary | ICD-10-CM | POA: Insufficient documentation

## 2023-03-03 DIAGNOSIS — I252 Old myocardial infarction: Secondary | ICD-10-CM | POA: Diagnosis not present

## 2023-03-03 DIAGNOSIS — I25118 Atherosclerotic heart disease of native coronary artery with other forms of angina pectoris: Secondary | ICD-10-CM | POA: Insufficient documentation

## 2023-03-03 HISTORY — PX: CARDIOVERSION: SHX1299

## 2023-03-03 LAB — GLUCOSE, CAPILLARY: Glucose-Capillary: 126 mg/dL — ABNORMAL HIGH (ref 70–99)

## 2023-03-03 SURGERY — CARDIOVERSION
Anesthesia: General

## 2023-03-03 MED ORDER — APIXABAN 5 MG PO TABS: 5.0000 mg | ORAL_TABLET | Freq: Two times a day (BID) | ORAL | 0 refills | Status: DC

## 2023-03-03 MED ORDER — PROPOFOL 10 MG/ML IV BOLUS
INTRAVENOUS | Status: DC | PRN
  Administered 2023-03-03 (×2): 10 mg via INTRAVENOUS
  Administered 2023-03-03: 30 mg via INTRAVENOUS

## 2023-03-03 MED ORDER — PROPOFOL 1000 MG/100ML IV EMUL
INTRAVENOUS | Status: AC
  Filled 2023-03-03: qty 100

## 2023-03-03 NOTE — Anesthesia Preprocedure Evaluation (Signed)
Anesthesia Evaluation  Patient identified by MRN, date of birth, ID band Patient awake    Reviewed: Allergy & Precautions, NPO status , Patient's Chart, lab work & pertinent test results  History of Anesthesia Complications Negative for: history of anesthetic complications  Airway Mallampati: II  TM Distance: >3 FB Neck ROM: Full    Dental  (+) Poor Dentition, Chipped   Pulmonary neg pulmonary ROS, neg sleep apnea, neg COPD, Patient abstained from smoking.Not current smoker   Pulmonary exam normal breath sounds clear to auscultation       Cardiovascular Exercise Tolerance: Poor METShypertension, Pt. on medications + CAD, + Past MI, + Cardiac Stents and +CHF  + dysrhythmias Atrial Fibrillation (-) pacemaker Rhythm:Irregular Rate:Normal - Systolic murmurs    Neuro/Psych  PSYCHIATRIC DISORDERS Anxiety Depression     Neuromuscular disease negative neurological ROS     GI/Hepatic ,GERD  ,,(+)     (-) substance abuse    Endo/Other  diabetes, Well Controlled, Insulin Dependent  Morbid obesity  Renal/GU negative Renal ROS     Musculoskeletal   Abdominal  (+) + obese  Peds  Hematology   Anesthesia Other Findings Past Medical History: 02/2002: Back injury     Comment:  worker's comp No date: CHF (congestive heart failure) (HCC) No date: Coronary artery disease, non-occlusive     Comment:  a. cath 2002 with no sig CAD;  b. cath 2008 normal LM,               LAD, LCx, p&dRCA 20-30%, PDA 30%; c.11/2015 NSTEMI/PCI: LM              nl, LAD 95p (2.5x15 Xience DES), LCX nl, RCA 100p/m w/               L->R collats, EF 55-65% c. NSTEMI (02/2016) with no               culprit leision, switched to Brilinta.  d. NSTEMI 03/2016:              again, no culprit lesion and switched back to plavix 2/2               SOB with Brilnta.   No date: Depression No date: Diabetes mellitus type 2, insulin dependent (HCC) No date:  Hyperlipemia No date: Hypertension No date: Hypertensive heart disease No date: Kidney stones No date: Morbid obesity (HCC) No date: Osteoarthritis No date: Snoring  Reproductive/Obstetrics                             Anesthesia Physical Anesthesia Plan  ASA: 3  Anesthesia Plan: General   Post-op Pain Management: Minimal or no pain anticipated   Induction: Intravenous  PONV Risk Score and Plan: 2 and Propofol infusion, TIVA and Ondansetron  Airway Management Planned: Nasal Cannula  Additional Equipment: None  Intra-op Plan:   Post-operative Plan:   Informed Consent: I have reviewed the patients History and Physical, chart, labs and discussed the procedure including the risks, benefits and alternatives for the proposed anesthesia with the patient or authorized representative who has indicated his/her understanding and acceptance.     Dental advisory given  Plan Discussed with: CRNA and Surgeon  Anesthesia Plan Comments: (Discussed risks of anesthesia with patient, including possibility of difficulty with spontaneous ventilation under anesthesia necessitating airway intervention, PONV, and rare risks such as cardiac or respiratory or neurological events, and allergic reactions. Discussed the role of  CRNA in patient's perioperative care. Patient understands.)       Anesthesia Quick Evaluation

## 2023-03-03 NOTE — Anesthesia Procedure Notes (Signed)
Date/Time: 03/03/2023 7:38 AM  Performed by: Ginger Carne, CRNAPre-anesthesia Checklist: Patient identified, Emergency Drugs available, Suction available, Patient being monitored and Timeout performed Patient Re-evaluated:Patient Re-evaluated prior to induction Oxygen Delivery Method: Nasal cannula Preoxygenation: Pre-oxygenation with 100% oxygen Induction Type: IV induction

## 2023-03-03 NOTE — Telephone Encounter (Signed)
Patient requesting Eliquis samples. Samples placed in sample room for patient.

## 2023-03-03 NOTE — CV Procedure (Signed)
Cardioversion procedure note For atrial fibrillation, stent.  Procedure Details:  Consent: Risks of procedure as well as the alternatives and risks of each were explained to the (patient/caregiver).  Consent for procedure obtained.  Time Out: Verified patient identification, verified procedure, site/side was marked, verified correct patient position, special equipment/implants available, medications/allergies/relevent history reviewed, required imaging and test results available.  Performed  Patient placed on cardiac monitor, pulse oximetry, supplemental oxygen as necessary.   Sedation given: propofol IV, Dr. Suzan Slick Pacer pads placed anterior and posterior chest.   Cardioverted 1 time(s).   Cardioverted at  150 J. Synchronized biphasic Converted to NSR   Evaluation: Findings: Post procedure EKG shows: NSR Complications: None Patient did tolerate procedure well.  Time Spent Directly with the Patient:  45 minutes   Dossie Arbour, M.D., Ph.D.

## 2023-03-03 NOTE — Transfer of Care (Signed)
Immediate Anesthesia Transfer of Care Note  Patient: [name]  Procedure(s) Performed: CARDIOVERSION  Patient Location: cardiac specials  Anesthesia Type:General  Level of Consciousness: awake, alert , and oriented  Airway & Oxygen Therapy: Patient Spontanous Breathing and Patient connected to nasal cannula oxygen  Post-op Assessment: Report given to RN and Post -op Vital signs reviewed and stable  Post vital signs: Reviewed and stable  Last Vitals:  Vitals Value Taken Time  BP 103/63 03/03/23 0749  Temp    Pulse 74 03/03/23 0750  Resp 19 03/03/23 0750  SpO2 98 % 03/03/23 0750  Vitals shown include unvalidated device data.  Last Pain:  Vitals:   03/03/23 0659  TempSrc: Oral  PainSc: 0-No pain         Complications: No notable events documented.

## 2023-03-03 NOTE — Anesthesia Postprocedure Evaluation (Signed)
Anesthesia Post Note  Patient: [name]  Procedure(s) Performed: CARDIOVERSION  Patient location during evaluation: Specials Recovery Anesthesia Type: General Level of consciousness: awake and alert Pain management: pain level controlled Vital Signs Assessment: post-procedure vital signs reviewed and stable Respiratory status: spontaneous breathing, nonlabored ventilation, respiratory function stable and patient connected to nasal cannula oxygen Cardiovascular status: stable and blood pressure returned to baseline Postop Assessment: no apparent nausea or vomiting Anesthetic complications: no   No notable events documented.   Last Vitals:  Vitals:   03/03/23 0815 03/03/23 0830  BP: 134/72 131/72  Pulse: 76 77  Resp: 14 12  Temp:    SpO2: 98% 97%    Last Pain:  Vitals:   03/03/23 0830  TempSrc:   PainSc: 0-No pain                 Corinda Gubler

## 2023-03-06 ENCOUNTER — Encounter: Payer: Self-pay | Admitting: Cardiovascular Disease

## 2023-03-08 ENCOUNTER — Other Ambulatory Visit (HOSPITAL_COMMUNITY): Payer: Self-pay

## 2023-03-08 NOTE — Progress Notes (Signed)
Paramedicine Encounter    Patient ID: [name], male    DOB: 02-27-1951, 72 y.o.   MRN: [id number]   Complaints-tired some   Edema-yes to both legs, the rt more than left   Compliance with meds-yes  Pill box filled-no, takes from bottles  If so, by whom-n/a  Refills needed-none   Finally able to see pt in the home, he has had multiple appointments the past couple wks and wasn't able to come see him.   He had cardioversion recently, he said he felt somewhat better after that, but not that much.  He was put on eliquis, the office gave him samples, unsure what the plan is for him to continue to get it. For 30 day supply it was $147.  He is not able to afford this.  I sent message to RN that gave him samples to see what the plan is.  He has the 6.25mg  of the carvedilol- I asked him how he was taking that and he is taking 2 tabs BID until they run out and then he will start the new bottle of the 12.5mg  tabs.  He is able to do his meds correctly.  Appetite ok. Eating/drinking ok.    He has swelling to his left leg and more to his rt leg.  He goes tomor for wound doc and then he goes to another doc on Friday to the vascular doc.   They are still working on a motorized wheelchair for him.   He would have to have paid $695 in OOP expense for the eliquis assistance.  His total income is $23,196 a year.   He does feel that he has some extra fluid on him today, swelling plus higher weight gain, he will take another lasix this afternoon.   He needs to get batteries for his scales. He weighed today and the batteries was beeping then.   CBG freestyle-130 this am  Not weighing daily.  BP 118/60   Pulse 77   Resp 16   Wt 255 lb (115.7 kg)   SpO2 97%   BMI 41.16 kg/m  Weight yesterday-? Last visit weight-252 on 6/28 at doc office   Patient Care Team: [name], MD as PCP - General Mariah Milling Tollie Pizza, MD as PCP - Cardiology (Cardiology) Antonieta Iba, MD as Consulting  Physician (Cardiology) Kathyrn Sheriff, [facility] (Inactive) as Pharmacist (Pharmacist)  Patient Active Problem List   Diagnosis Date Noted   SOB (shortness of breath) 03/03/2023   Atrial fibrillation (HCC) 02/22/2023   Alteration in mobility associated with pain 01/12/2023   Diabetic ulcer of ankle (HCC) 01/12/2023   Peripheral neuropathy 01/12/2023   NSTEMI (non-ST elevated myocardial infarction) (HCC) 11/05/2022   COVID-19 virus infection 11/05/2022   Penis disorder 05/16/2022   Other fatigue 05/16/2022   Rash 05/12/2022   GAD (generalized anxiety disorder) 09/21/2021   Encounter for power mobility device assessment 05/13/2021   Statin myopathy 03/30/2021   Chronic midline low back pain 11/12/2020   Tinea corporis 04/10/2018   ED (erectile dysfunction) 03/06/2018   Acute on chronic diastolic CHF (congestive heart failure) (HCC) 09/20/2016   Adjustment disorder with mixed anxiety and depressed mood 09/09/2016   Gastroesophageal reflux disease 09/09/2016   Elevated left ventricular end-diastolic pressure (LVEDP) 03/11/2016   Anemia 03/11/2016   Hypertensive heart disease with heart failure (HCC)    Diabetes mellitus type 2 with retinopathy (HCC)    Coronary artery disease of native artery of native heart with  stable angina pectoris (HCC)    Morbid obesity (HCC)    Chronic chest pain    Diabetic retinopathy (HCC) 09/23/2014   Snoring 12/21/2012   HYPOGONADISM 09/28/2010   B12 deficiency 09/28/2010   Vitamin D deficiency 09/28/2010   OTHER MALAISE AND FATIGUE 09/17/2010   TRIGGER FINGER, RIGHT MIDDLE 09/14/2010   BRANCH RETINAL VEIN OCCLUSION 11/26/2009   CONSTIPATION 08/10/2009   GASTRITIS 07/29/2009   Major depressive disorder, recurrent episode, moderate (HCC) 07/06/2007   RENAL CALCULUS, HX OF 03/26/2007   Hyperlipidemia associated with type 2 diabetes mellitus (HCC) 12/05/2006   Hypertension associated with diabetes (HCC) 12/05/2006   Osteoarthritis 12/05/2006     Current Outpatient Medications:    Acetaminophen (TYLENOL PO), Take 3-4 tablets by mouth as needed (pain)., Disp: , Rfl:    apixaban (ELIQUIS) 5 MG TABS tablet, Take 1 tablet (5 mg total) by mouth 2 (two) times daily., Disp: 56 tablet, Rfl: 0   carvedilol (COREG) 12.5 MG tablet, Take 1 tablet (12.5 mg total) by mouth 2 (two) times daily with a meal., Disp: 180 tablet, Rfl: 3   cholecalciferol (VITAMIN D3) 25 MCG (1000 UNIT) tablet, Take 2,000 Units by mouth daily., Disp: , Rfl:    clopidogrel (PLAVIX) 75 MG tablet, TAKE ONE TABLET BY MOUTH ONCE DAILY WITH BREAKFAST, Disp: 90 tablet, Rfl: 3   Continuous Blood Gluc Sensor (FREESTYLE LIBRE 2 SENSOR) MISC, APPLY SENSOR EVERY 14 DAYS TO MONITOR SUGAR CONTINOUSLY, Disp: 2 each, Rfl: 11   Continuous Glucose Receiver (FREESTYLE LIBRE 2 READER) DEVI, USE WITH SENSORS TO MONITOR SUGAR CONTINUOUSLY, Disp: 1 each, Rfl: 0   Dulaglutide (TRULICITY) 4.5 MG/0.5ML SOPN, Inject 4.5 mg into the skin once a week. Via [name] PAP, Disp: , Rfl:    empagliflozin (JARDIANCE) 10 MG TABS tablet, Take 1 tablet (10 mg total) by mouth daily., Disp: 30 tablet, Rfl: 5   ezetimibe (ZETIA) 10 MG tablet, TAKE ONE TABLET BY MOUTH ONCE DAILY, Disp: 30 tablet, Rfl: 2   furosemide (LASIX) 40 MG tablet, TAKE ONE TABLET BY MOUTH ONCE A DAY CAN TAKE A 2ND DAILY DOSE AS NEEDED., Disp: 180 tablet, Rfl: 1   HYDROcodone-acetaminophen (NORCO) 5-325 MG tablet, Take 1-2 tablets by mouth daily as needed for moderate pain., Disp: 60 tablet, Rfl: 0   insulin aspart (NOVOLOG FLEXPEN) 100 UNIT/ML FlexPen, 13 units in AM (scheduled) and 3 units PRN in evening (Patient taking differently: Inject 3-13 Units into the skin in the morning and at bedtime. Inject 13 units in the morning and 3 units at night), Disp: 15 mL, Rfl: 6   sacubitril-valsartan (ENTRESTO) 24-26 MG, Take 1 tablet by mouth 2 (two) times daily., Disp: 60 tablet, Rfl: 3   spironolactone (ALDACTONE) 25 MG tablet, Take 1 tablet (25  mg total) by mouth daily., Disp: 30 tablet, Rfl: 5   terbinafine (LAMISIL) 250 MG tablet, TAKE ONE TABLET (250 MG TOTAL) BY MOUTH DAILY., Disp: 30 tablet, Rfl: 0   TRESIBA FLEXTOUCH 100 UNIT/ML FlexTouch Pen, INJECT 50 UNITS INTO THE SKIN DAILY, Disp: 15 mL, Rfl: 2   triamcinolone cream (KENALOG) 0.5 %, APPLY ONE APPLICATION TOPICALLY TWO (TWO) TIMES DAILY., Disp: 30 g, Rfl: 0   TRUEPLUS 5-BEVEL PEN NEEDLES 31G X 6 MM MISC, USE TO INJECT INSULIN THREE TIMES A DAY, Disp: 300 each, Rfl: 3   venlafaxine XR (EFFEXOR-XR) 150 MG 24 hr capsule, TAKE 1 CAPSULE BY MOUTH DAILY WITH BREAKFAST. TAKE WITH EFFEXOR XR 75MG  FORA TOTAL OF 225MG  (Patient taking differently:  Take 150 mg by mouth daily with breakfast. Take with 75 mg), Disp: 90 capsule, Rfl: 0   vitamin B-12 (CYANOCOBALAMIN) 1000 MCG tablet, Take 1,000 mcg by mouth daily., Disp: , Rfl:    guaiFENesin-dextromethorphan (ROBITUSSIN DM) 100-10 MG/5ML syrup, Take 5 mLs by mouth every 4 (four) hours as needed for cough. (Patient not taking: Reported on 03/08/2023), Disp: 118 mL, Rfl: 0   HYDROcodone-acetaminophen (NORCO) 5-325 MG tablet, Take 1-2 tablets by mouth daily as needed for moderate pain. April (Patient not taking: Reported on 03/08/2023), Disp: 60 tablet, Rfl: 0   HYDROcodone-acetaminophen (NORCO/VICODIN) 5-325 MG tablet, TAKE ONE (1) TO TWO (2) TABLETS BY MOUTH DAILY AS NEEDED FOR MODERATE PAIN. MAY (Patient not taking: Reported on 03/08/2023), Disp: 60 tablet, Rfl: 0   hydrOXYzine (ATARAX) 10 MG tablet, TAKE 1 TABLET BY MOUTH 3 TIMES DAILY AS NEEDED FOR ANXIETY (Patient not taking: Reported on 03/08/2023), Disp: 30 tablet, Rfl: 2   isosorbide mononitrate (IMDUR) 30 MG 24 hr tablet, Take 1 tablet (30 mg total) by mouth 2 (two) times daily., Disp: 60 tablet, Rfl: 2   ketoconazole (NIZORAL) 2 % cream, Apply 1 Application topically daily. (Patient not taking: Reported on 02/24/2023), Disp: 15 g, Rfl: 0   nitroGLYCERIN (NITROSTAT) 0.4 MG SL tablet, DISSOLVE 1  TABLET UNDER TONGUE AS NEEDEDFOR CHEST PAIN. MAY REPEAT 5 MINUTES APART 3 TIMES IF NEEDED (Patient not taking: Reported on 02/24/2023), Disp: 25 tablet, Rfl: 3 Allergies  Allergen Reactions   Bupropion Nausea Only   Ozempic (0.25 Or 0.5 Mg-Dose) [Semaglutide(0.25 Or 0.5mg -Dos)] Nausea And Vomiting   Atorvastatin Other (See Comments)    Body aches Similar effect with rosuvastatin 40 mg twice weekly      Social History   Socioeconomic History   Marital status: Widowed    Spouse name: Not on file   Number of children: 3   Years of education: Not on file   Highest education level: 7th grade  Occupational History   Occupation: disabled    Associate Professor: UNEMPLOYED    Comment: back injury  Tobacco Use   Smoking status: Never   Smokeless tobacco: Never  Vaping Use   Vaping Use: Never used  Substance and Sexual Activity   Alcohol use: No    Alcohol/week: 0.0 standard drinks of alcohol   Drug use: No   Sexual activity: Not Currently  Other Topics Concern   Not on file  Social History Narrative   Has a roommate, Mr. [name]. No pets.   Social Determinants of Health   Financial Resource Strain: Low Risk  (11/23/2022)   Overall Financial Resource Strain (CARDIA)    Difficulty of Paying Living Expenses: Not hard at all  Food Insecurity: No Food Insecurity (12/29/2022)   Hunger Vital Sign    Worried About Running Out of Food in the Last Year: Never true    Ran Out of Food in the Last Year: Never true  Transportation Needs: No Transportation Needs (12/29/2022)   PRAPARE - Administrator, Civil Service (Medical): No    Lack of Transportation (Non-Medical): No  Physical Activity: Inactive (11/09/2022)   Exercise Vital Sign    Days of Exercise per Week: 0 days    Minutes of Exercise per Session: 0 min  Stress: No Stress Concern Present (11/09/2022)   [employer] of Occupational Health - Occupational Stress Questionnaire    Feeling of Stress : Only a little  Social  Connections: Moderately Isolated (11/09/2022)   Social Connection and Isolation  Panel [NHANES]    Frequency of Communication with Friends and Family: More than three times a week    Frequency of Social Gatherings with Friends and Family: More than three times a week    Attends Religious Services: More than 4 times per year    Active Member of [employer] or Organizations: No    Attends Banker Meetings: Never    Marital Status: Widowed  Intimate Partner Violence: Not At Risk (11/09/2022)   Humiliation, Afraid, Rape, and Kick questionnaire    Fear of Current or Ex-Partner: No    Emotionally Abused: No    Physically Abused: No    Sexually Abused: No    Physical Exam      Future Appointments  Date Time Provider Department Center  03/09/2023  1:15 PM Allen Derry Eday III, PA-C [facility] None  03/10/2023 11:00 AM Georgiana Spinner, NP [facility] None  04/03/2023  3:00 PM Delma Freeze, FNP [facility] None  04/05/2023  1:55 PM Charlsie Quest, NP [facility] None  05/18/2023  8:00 AM [facility]  05/25/2023  2:40 PM [name], MD [facility]  11/14/2023 10:45 AM [facility] ANNUAL WELLNESS VISIT 1 [facility]       Kerry Hough, Paramedic [phone] [facility] Paramedic  03/08/23

## 2023-03-09 ENCOUNTER — Encounter: Payer: PPO | Admitting: Physician Assistant

## 2023-03-09 DIAGNOSIS — E11621 Type 2 diabetes mellitus with foot ulcer: Secondary | ICD-10-CM | POA: Diagnosis not present

## 2023-03-09 DIAGNOSIS — L97412 Non-pressure chronic ulcer of right heel and midfoot with fat layer exposed: Secondary | ICD-10-CM | POA: Diagnosis not present

## 2023-03-09 DIAGNOSIS — L97512 Non-pressure chronic ulcer of other part of right foot with fat layer exposed: Secondary | ICD-10-CM | POA: Diagnosis not present

## 2023-03-09 NOTE — Progress Notes (Addendum)
BLADEN, UMAR ([id number]) [id number] Page 1 of 10 Visit Report for 03/09/2023 Chief Complaint Document Details Patient Name: Date of Service: [name] [facility] H. 03/09/2023 1:15 PM Medical Record Number: [id number] Patient Account Number: [id number] Date of Birth/Sex: Treating RN: February 10, 1951 (72 y.o. [name] Primary Care Provider: Kerby Nora Other Clinician: Referring Provider: Treating Provider/Extender: [name], [name] in Treatment: 5 Information Obtained from: Patient Chief Complaint Right foot ulcers Electronic Signature(s) Signed: 03/09/2023 1:19:50 PM By: Allen Derry PA-C Entered By: Allen Derry on 03/09/2023 13:19:50 -------------------------------------------------------------------------------- Debridement Details Patient Name: Date of Service: [name] [facility] H. 03/09/2023 1:15 PM Medical Record Number: [id number] Patient Account Number: [id number] Date of Birth/Sex: Treating RN: 1951-05-29 (72 y.o. [name] Primary Care Provider: Kerby Nora Other Clinician: Referring Provider: Treating Provider/Extender: [name], [name] in Treatment: 5 Debridement Performed for Assessment: Wound #2 Right,Distal,Dorsal Foot Performed By: Physician Allen Derry, PA-C Debridement Type: Debridement Severity of Tissue Pre Debridement: Fat layer exposed Level of Consciousness (Pre-procedure): Awake and Alert Pre-procedure Verification/Time Out Yes - 13:42 Taken: Start Time: 13:42 Pain Control: Lidocaine 2% T opical Liquid Percent of Wound Bed Debrided: 100% T Area Debrided (cm): otal 0.63 Tissue and other material debrided: Viable, Non-Viable, Slough, Subcutaneous, Slough Level: Skin/Subcutaneous Tissue Debridement Description: Excisional Instrument: Curette Bleeding: Minimum Hemostasis Achieved: Pressure Procedural Pain: 2 Post Procedural Pain: 2 ETAN, VASUDEVAN ([id number])  [id number] Page 2 of 10 Response to Treatment: Procedure was tolerated well Level of Consciousness (Post- Awake and Alert procedure): Post Debridement Measurements of Total Wound Length: (cm) 1 Width: (cm) 0.8 Depth: (cm) 0.2 Volume: (cm) 0.126 Character of Wound/Ulcer Post Debridement: Stable Severity of Tissue Post Debridement: Fat layer exposed Post Procedure Diagnosis Same as Pre-procedure Electronic Signature(s) Signed: 03/09/2023 5:16:05 PM By: Midge Aver MSN RN CNS WTA Signed: 03/09/2023 5:17:09 PM By: Allen Derry PA-C Entered By: Midge Aver on 03/09/2023 13:43:07 -------------------------------------------------------------------------------- Debridement Details Patient Name: Date of Service: [name] HN H. 03/09/2023 1:15 PM Medical Record Number: [id number] Patient Account Number: [id number] Date of Birth/Sex: Treating RN: 02-11-1951 (72 y.o. [name] Primary Care Provider: Kerby Nora Other Clinician: Referring Provider: Treating Provider/Extender: [name], [name] in Treatment: 5 Debridement Performed for Assessment: Wound #3 Right,Proximal,Dorsal Foot Performed By: Physician Allen Derry, PA-C Debridement Type: Debridement Severity of Tissue Pre Debridement: Fat layer exposed Level of Consciousness (Pre-procedure): Awake and Alert Pre-procedure Verification/Time Out Yes - 13:42 Taken: Start Time: 13:42 Pain Control: Lidocaine 4% T opical Solution Percent of Wound Bed Debrided: 100% T Area Debrided (cm): otal 2.83 Tissue and other material debrided: Viable, Non-Viable, Slough, Subcutaneous, Slough Level: Skin/Subcutaneous Tissue Debridement Description: Excisional Instrument: Curette Bleeding: Minimum Hemostasis Achieved: Pressure Procedural Pain: 2 Post Procedural Pain: 2 Response to Treatment: Procedure was tolerated well Level of Consciousness (Post- Awake and Alert procedure): Post Debridement  Measurements of Total Wound Length: (cm) 1.8 Width: (cm) 2 Depth: (cm) 0.2 Volume: (cm) 0.565 Character of Wound/Ulcer Post Debridement: Stable Severity of Tissue Post Debridement: Fat layer exposed Post Procedure Diagnosis Same as [name], [name] ([id number]) [id number] Page 3 of 10 Electronic Signature(s) Signed: 03/09/2023 5:16:05 PM By: Midge Aver MSN RN CNS WTA Signed: 03/09/2023 5:17:09 PM By: Allen Derry PA-C Entered By: Midge Aver on 03/09/2023 13:43:55 -------------------------------------------------------------------------------- Debridement Details Patient Name: Date of Service: [name] HN H. 03/09/2023 1:15 PM Medical Record Number: [id number] Patient Account Number: [id number] Date of Birth/Sex: Treating RN: 1950-09-05 (72 y.o. [name],  [name] Primary Care Provider: Kerby Nora Other Clinician: Referring Provider: Treating Provider/Extender: [name], [name] in Treatment: 5 Debridement Performed for Assessment: Wound #1 Right Calcaneus Performed By: Physician Allen Derry, PA-C Debridement Type: Debridement Severity of Tissue Pre Debridement: Fat layer exposed Level of Consciousness (Pre-procedure): Awake and Alert Pre-procedure Verification/Time Out Yes - 13:42 Taken: Start Time: 13:42 Pain Control: Lidocaine 2% T opical Liquid Percent of Wound Bed Debrided: 100% T Area Debrided (cm): otal 1.88 Tissue and other material debrided: Viable, Non-Viable, Slough, Subcutaneous, Slough Level: Skin/Subcutaneous Tissue Debridement Description: Excisional Instrument: Curette Bleeding: Minimum Hemostasis Achieved: Pressure Procedural Pain: 2 Post Procedural Pain: 2 Response to Treatment: Procedure was tolerated well Level of Consciousness (Post- Awake and Alert procedure): Post Debridement Measurements of Total Wound Length: (cm) 3 Width: (cm) 0.8 Depth: (cm) 0.3 Volume: (cm) 0.565 Character of  Wound/Ulcer Post Debridement: Stable Severity of Tissue Post Debridement: Fat layer exposed Post Procedure Diagnosis Same as Pre-procedure Electronic Signature(s) Signed: 03/09/2023 5:16:05 PM By: Midge Aver MSN RN CNS WTA Signed: 03/09/2023 5:17:09 PM By: Allen Derry PA-C Entered By: Midge Aver on 03/09/2023 13:44:33 [name], [name] ([id number]) [id number] Page 4 of 10 -------------------------------------------------------------------------------- HPI Details Patient Name: Date of Service: [name] [facility] H. 03/09/2023 1:15 PM Medical Record Number: [id number] Patient Account Number: [id number] Date of Birth/Sex: Treating RN: [birth year] (72 y.o. [name] Primary Care Provider: Kerby Nora Other Clinician: Referring Provider: Treating Provider/Extender: [name], [name] in Treatment: 5 History of Present Illness HPI Description: 01-31-2023 upon evaluation today patient appears to be doing poorly currently in regard to wounds on his right heel, right dorsal foot, and right lateral foot. Subsequently he does have known peripheral vascular disease and again this is something that he needs to I think Checked formally. This is what the majority of the conversation today hinged around and the patient voiced understanding as far as that is concerned. Fortunately I do not see any signs of active infection locally nor systemically which is great news. Patient does have a history of diabetes mellitus type 2, atrial fibrillation for which she is on long-term anticoagulant Therapy, hypertension, and coronary artery disease. Patient's hemoglobin A1c most recently was on 11-17-2022 and was 7.2 and currently he is on Eliquis and Plavix. 02-07-2023 upon evaluation today patient appears to be doing well currently in regard to his wounds all things considered I feel like we are still maintaining that does not seem like it is any worse is also not significantly  better. I discussed with the patient that I do believe he would benefit from the arterial evaluation we still need to get this done as quickly as possible and subsequently we did put in a follow-up call with the vascular office today with regard to this. 02-14-2023 upon evaluation today patient appears to be doing a little better in regard to his wounds which are showing signs of loosening which is great news. With that being said he is experiencing an improvement overall in his symptoms and we are still waiting on the vascular evaluation want to get this result and consider whether or not his blood flow is good or not we will be able to make a better determination of next steps. For now we will try and avoid any aggressive sharp debridement to know that he has good arterial flow. 02-21-2023 upon evaluation today patient appears to be doing about the same in regard to his wound. Fortunately there does not appear to be any  signs of active infection at this time which is great news. No fevers, chills, nausea, vomiting, or diarrhea. I did review patient's arterial study and it appears that he has pretty good flow in the left is not perfect but it is decent. On the right however he is definitely not doing nearly as good and I think that he is going to need to see one of the vascular doctors for further evaluation and treatment of this right leg in order to get these wounds to heal. 02-28-2023 upon evaluation today patient appears to be doing well currently in regard to his wound. He has been tolerating the dressing changes without complication. Fortunately I do not see any evidence of active infection locally nor systemically at this time. I do think that the eschar started to soften up and I would like to try to get some of this off to see if we can get things moving in the right direction. He is in agreement with this plan. 03-09-2023 upon evaluation today patient appears to be doing well currently in regard  to his wound. He has been tolerating the dressing changes without complication. Fortunately there does not appear to be any signs of active infection locally nor systemically at this time which is great news. I do believe clearing out some of the necrotic debris last week has helped to a degree. Electronic Signature(s) Signed: 03/09/2023 1:45:59 PM By: Allen Derry PA-C Entered By: Allen Derry on 03/09/2023 13:45:59 -------------------------------------------------------------------------------- Physical Exam Details Patient Name: Date of Service: [name] HN H. 03/09/2023 1:15 PM Medical Record Number: [id number] Patient Account Number: [id number] Date of Birth/Sex: Treating RN: [birth year] (72 y.o. [name] Primary Care Provider: Kerby Nora Other Clinician: Referring Provider: Treating Provider/Extender: [name], [name] ([id number]) [id number] Page 5 of 10 [name] in Treatment: 5 Constitutional Obese and well-hydrated in no acute distress. Respiratory normal breathing without difficulty. Psychiatric this patient is able to make decisions and demonstrates good insight into disease process. Alert and Oriented x 3. pleasant and cooperative. Notes Upon inspection patient's wound bed actually showed signs of good granulation. There is also some good epithelization but at the same time there is also still some necrotic tissue that is can have to be cleared away. Fortunately I do not see any signs of worsening overall and I do believe that the patient is doing quite well. I did perform debridement today to clearway some of the necrotic debris again looking at this little by little until we get it completely cleared off. and some area Electronic Signature(s) Signed: 03/09/2023 1:46:27 PM By: Allen Derry PA-C Entered By: Allen Derry on 03/09/2023  13:46:27 -------------------------------------------------------------------------------- Physician Orders Details Patient Name: Date of Service: [name] HN H. 03/09/2023 1:15 PM Medical Record Number: [id number] Patient Account Number: [id number] Date of Birth/Sex: Treating RN: [birth year] (72 y.o. [name] Primary Care Provider: Kerby Nora Other Clinician: Referring Provider: Treating Provider/Extender: [name], [name] in Treatment: 5 Verbal / Phone Orders: No Diagnosis Coding ICD-10 Coding Code Description E11.621 Type 2 diabetes mellitus with foot ulcer L97.512 Non-pressure chronic ulcer of other part of right foot with fat layer exposed I48.0 Paroxysmal atrial fibrillation Z79.01 Long term (current) use of anticoagulants I10 Essential (primary) hypertension I25.10 Atherosclerotic heart disease of native coronary artery without angina pectoris Follow-up Appointments Return Appointment in 1 week. Bathing/ [employer] wounds with antibacterial soap and water. Anesthetic (Use 'Patient Medications' Section for Anesthetic Order Entry) Lidocaine applied  to wound bed Wound Treatment Wound #1 - Calcaneus Wound Laterality: Right Cleanser: Byram Ancillary Kit - 15 Day Supply (Generic) 3 x Per Week/30 Days Discharge Instructions: Use supplies as instructed; Kit contains: (15) Saline Bullets; (15) 3x3 Gauze; 15 pr Gloves Cleanser: Soap and Water 3 x Per Week/30 Days Discharge Instructions: Gently cleanse wound with antibacterial soap, rinse and pat dry prior to dressing wounds [name], [name] ([id number]) [id number] Page 6 of 10 Prim Dressing: IODOFLEX 0.9% Cadexomer Iodine Pad (DME) (Generic) 3 x Per Week/30 Days ary Discharge Instructions: Apply Iodoflex to wound bed only as directed. Secondary Dressing: (BORDER) Zetuvit Plus SILICONE BORDER Dressing 4x4 (in/in) (DME) (Generic) 3 x Per Week/30 Days Discharge Instructions:  Please do not put silicone bordered dressings under wraps. Use non-bordered dressing only. Wound #2 - Foot Wound Laterality: Dorsal, Right, Distal Cleanser: Byram Ancillary Kit - 15 Day Supply (Generic) 3 x Per Week/30 Days Discharge Instructions: Use supplies as instructed; Kit contains: (15) Saline Bullets; (15) 3x3 Gauze; 15 pr Gloves Cleanser: Soap and Water 3 x Per Week/30 Days Discharge Instructions: Gently cleanse wound with antibacterial soap, rinse and pat dry prior to dressing wounds Prim Dressing: IODOFLEX 0.9% Cadexomer Iodine Pad (DME) (Generic) 3 x Per Week/30 Days ary Discharge Instructions: Apply Iodoflex to wound bed only as directed. Secondary Dressing: (BORDER) Zetuvit Plus SILICONE BORDER Dressing 4x4 (in/in) (DME) (Generic) 3 x Per Week/30 Days Discharge Instructions: Please do not put silicone bordered dressings under wraps. Use non-bordered dressing only. Wound #3 - Foot Wound Laterality: Dorsal, Right, Proximal Cleanser: Soap and Water 3 x Per Week/30 Days Discharge Instructions: Gently cleanse wound with antibacterial soap, rinse and pat dry prior to dressing wounds Prim Dressing: IODOFLEX 0.9% Cadexomer Iodine Pad (DME) (Generic) 3 x Per Week/30 Days ary Discharge Instructions: Apply Iodoflex to wound bed only as directed. Secondary Dressing: (BORDER) Zetuvit Plus SILICONE BORDER Dressing 4x4 (in/in) (DME) (Generic) 3 x Per Week/30 Days Discharge Instructions: Please do not put silicone bordered dressings under wraps. Use non-bordered dressing only. Electronic Signature(s) Signed: 03/09/2023 5:16:05 PM By: Midge Aver MSN RN CNS WTA Signed: 03/09/2023 5:17:09 PM By: Allen Derry PA-C Entered By: Midge Aver on 03/09/2023 13:46:32 -------------------------------------------------------------------------------- Problem List Details Patient Name: Date of Service: [name] HN H. 03/09/2023 1:15 PM Medical Record Number: [id number] Patient Account Number:  [id number] Date of Birth/Sex: Treating RN: [birth year] (72 y.o. [name] Primary Care Provider: Kerby Nora Other Clinician: Referring Provider: Treating Provider/Extender: [name], [name] in Treatment: 5 Active Problems ICD-10 Encounter Code Description Active Date MDM Diagnosis E11.621 Type 2 diabetes mellitus with foot ulcer 01/31/2023 No Yes L97.512 Non-pressure chronic ulcer of other part of right foot with fat layer exposed 01/31/2023 No Yes I48.0 Paroxysmal atrial fibrillation 01/31/2023 No Yes [name], [name] ([id number]) [id number] Page 7 of 10 Z79.01 Long term (current) use of anticoagulants 01/31/2023 No Yes I10 Essential (primary) hypertension 01/31/2023 No Yes I25.10 Atherosclerotic heart disease of native coronary artery without angina pectoris 01/31/2023 No Yes Inactive Problems Resolved Problems Electronic Signature(s) Signed: 03/09/2023 1:19:46 PM By: Allen Derry PA-C Entered By: Allen Derry on 03/09/2023 13:19:46 -------------------------------------------------------------------------------- Progress Note Details Patient Name: Date of Service: [name] HN H. 03/09/2023 1:15 PM Medical Record Number: [id number] Patient Account Number: [id number] Date of Birth/Sex: Treating RN: 07-13-1951 (72 y.o. [name] Primary Care Provider: Kerby Nora Other Clinician: Referring Provider: Treating Provider/Extender: [name], [name] in Treatment: 5 Subjective Chief Complaint Information obtained from Patient  Right foot ulcers History of Present Illness (HPI) 01-31-2023 upon evaluation today patient appears to be doing poorly currently in regard to wounds on his right heel, right dorsal foot, and right lateral foot. Subsequently he does have known peripheral vascular disease and again this is something that he needs to I think Checked formally. This is what the majority of the conversation today hinged around and  the patient voiced understanding as far as that is concerned. Fortunately I do not see any signs of active infection locally nor systemically which is great news. Patient does have a history of diabetes mellitus type 2, atrial fibrillation for which she is on long-term anticoagulant Therapy, hypertension, and coronary artery disease. Patient's hemoglobin A1c most recently was on 11-17-2022 and was 7.2 and currently he is on Eliquis and Plavix. 02-07-2023 upon evaluation today patient appears to be doing well currently in regard to his wounds all things considered I feel like we are still maintaining that does not seem like it is any worse is also not significantly better. I discussed with the patient that I do believe he would benefit from the arterial evaluation we still need to get this done as quickly as possible and subsequently we did put in a follow-up call with the vascular office today with regard to this. 02-14-2023 upon evaluation today patient appears to be doing a little better in regard to his wounds which are showing signs of loosening which is great news. With that being said he is experiencing an improvement overall in his symptoms and we are still waiting on the vascular evaluation want to get this result and consider whether or not his blood flow is good or not we will be able to make a better determination of next steps. For now we will try and avoid any aggressive sharp debridement to know that he has good arterial flow. 02-21-2023 upon evaluation today patient appears to be doing about the same in regard to his wound. Fortunately there does not appear to be any signs of active infection at this time which is great news. No fevers, chills, nausea, vomiting, or diarrhea. I did review patient's arterial study and it appears that he has pretty good flow in the left is not perfect but it is decent. On the right however he is definitely not doing nearly as good and I think that he is going  to need to see one of the vascular doctors for further evaluation and treatment of this right leg in order to get these wounds to heal. 02-28-2023 upon evaluation today patient appears to be doing well currently in regard to his wound. He has been tolerating the dressing changes without complication. Fortunately I do not see any evidence of active infection locally nor systemically at this time. I do think that the eschar started to soften up and I would like to try to get some of this off to see if we can get things moving in the right direction. He is in agreement with this plan. [name], [name] ([id number]) [id number] Page 8 of 10 03-09-2023 upon evaluation today patient appears to be doing well currently in regard to his wound. He has been tolerating the dressing changes without complication. Fortunately there does not appear to be any signs of active infection locally nor systemically at this time which is great news. I do believe clearing out some of the necrotic debris last week has helped to a degree. Objective Constitutional Obese and well-hydrated in no acute distress. Vitals  Time Taken: 1:06 PM, Height: 66 in, Weight: 250 lbs, BMI: 40.3, Temperature: 98.0 F, Pulse: 81 bpm, Respiratory Rate: 18 breaths/min, Blood Pressure: 121/67 mmHg. Respiratory normal breathing without difficulty. Psychiatric this patient is able to make decisions and demonstrates good insight into disease process. Alert and Oriented x 3. pleasant and cooperative. General Notes: Upon inspection patient's wound bed actually showed signs of good granulation. There is also some good epithelization but at the same time there is also still some necrotic tissue that is can have to be cleared away. Fortunately I do not see any signs of worsening overall and I do believe that the patient is doing quite well. I did perform debridement today to clearway some of the necrotic debris again looking at this  little by little until we get it completely cleared off. and some area Integumentary (Hair, Skin) Wound #1 status is Open. Original cause of wound was Gradually Appeared. The date acquired was: 12/10/2022. The wound has been in treatment 5 [name]. The wound is located on the Right Calcaneus. The wound measures 3cm length x 0.8cm width x 0.3cm depth; 1.885cm^2 area and 0.565cm^3 volume. There is Fat Layer (Subcutaneous Tissue) exposed. There is a medium amount of serosanguineous drainage noted. There is medium (34-66%) red granulation within the wound bed. There is no necrotic tissue within the wound bed. Wound #2 status is Open. Original cause of wound was Gradually Appeared. The date acquired was: 12/10/2022. The wound has been in treatment 5 [name]. The wound is located on the Right,Distal,Dorsal Foot. The wound measures 1cm length x 0.8cm width x 0.2cm depth; 0.628cm^2 area and 0.126cm^3 volume. There is Fat Layer (Subcutaneous Tissue) exposed. There is a medium amount of serosanguineous drainage noted. There is no granulation within the wound bed. There is no necrotic tissue within the wound bed. Wound #3 status is Open. Original cause of wound was Gradually Appeared. The date acquired was: 12/10/2022. The wound has been in treatment 5 [name]. The wound is located on the Right,Proximal,Dorsal Foot. The wound measures 1.8cm length x 2cm width x 0.2cm depth; 2.827cm^2 area and 0.565cm^3 volume. There is Fat Layer (Subcutaneous Tissue) exposed. There is a medium amount of serosanguineous drainage noted. There is no granulation within the wound bed. There is a large (67-100%) amount of necrotic tissue within the wound bed including Adherent Slough. Assessment Active Problems ICD-10 Type 2 diabetes mellitus with foot ulcer Non-pressure chronic ulcer of other part of right foot with fat layer exposed Paroxysmal atrial fibrillation Long term (current) use of anticoagulants Essential (primary)  hypertension Atherosclerotic heart disease of native coronary artery without angina pectoris Procedures Wound #1 Pre-procedure diagnosis of Wound #1 is a Diabetic Wound/Ulcer of the Lower Extremity located on the Right Calcaneus .Severity of Tissue Pre Debridement is: Fat layer exposed. There was a Excisional Skin/Subcutaneous Tissue Debridement with a total area of 1.88 sq cm performed by Allen Derry, PA-C. With the following instrument(s): Curette to remove Viable and Non-Viable tissue/material. Material removed includes Subcutaneous Tissue and Slough and after achieving pain control using Lidocaine 2% T opical Liquid. No specimens were taken. A time out was conducted at 13:42, prior to the start of the procedure. A Minimum amount of bleeding was controlled with Pressure. The procedure was tolerated well with a pain level of 2 throughout and a pain level of 2 following the procedure. Post Debridement Measurements: 3cm length x 0.8cm width x 0.3cm depth; 0.565cm^3 volume. Character of Wound/Ulcer Post Debridement is stable. Severity of Tissue Post  Debridement is: Fat layer exposed. Post procedure Diagnosis Wound #1: Same as Pre-Procedure Wound #2 Pre-procedure diagnosis of Wound #2 is a Diabetic Wound/Ulcer of the Lower Extremity located on the Right,Distal,Dorsal Foot .Severity of Tissue Pre Debridement is: Fat layer exposed. There was a Excisional Skin/Subcutaneous Tissue Debridement with a total area of 0.63 sq cm performed by Allen Derry, PA-C. With the following instrument(s): Curette to remove Viable and Non-Viable tissue/material. Material removed includes Subcutaneous Tissue and Slough and after achieving pain control using Lidocaine 2% Topical Liquid. No specimens were taken. A time out was conducted at 13:42, prior to the start of the [name], [name] ([id number]) [id number] Page 9 of 10 procedure. A Minimum amount of bleeding was controlled with Pressure. The  procedure was tolerated well with a pain level of 2 throughout and a pain level of 2 following the procedure. Post Debridement Measurements: 1cm length x 0.8cm width x 0.2cm depth; 0.126cm^3 volume. Character of Wound/Ulcer Post Debridement is stable. Severity of Tissue Post Debridement is: Fat layer exposed. Post procedure Diagnosis Wound #2: Same as Pre-Procedure Wound #3 Pre-procedure diagnosis of Wound #3 is a Diabetic Wound/Ulcer of the Lower Extremity located on the Right,Proximal,Dorsal Foot .Severity of Tissue Pre Debridement is: Fat layer exposed. There was a Excisional Skin/Subcutaneous Tissue Debridement with a total area of 2.83 sq cm performed by Allen Derry, PA-C. With the following instrument(s): Curette to remove Viable and Non-Viable tissue/material. Material removed includes Subcutaneous Tissue and Slough and after achieving pain control using Lidocaine 4% Topical Solution. No specimens were taken. A time out was conducted at 13:42, prior to the start of the procedure. A Minimum amount of bleeding was controlled with Pressure. The procedure was tolerated well with a pain level of 2 throughout and a pain level of 2 following the procedure. Post Debridement Measurements: 1.8cm length x 2cm width x 0.2cm depth; 0.565cm^3 volume. Character of Wound/Ulcer Post Debridement is stable. Severity of Tissue Post Debridement is: Fat layer exposed. Post procedure Diagnosis Wound #3: Same as Pre-Procedure Plan 1. I would recommend that we have the patient continue to monitor for any signs of infection or worsening. 2. I would recommend as well that the patient should continue with the Iodoflex. 3. He also has a appointment tomorrow with vascular for further evaluation and see what they have to say as well. We will see patient back for reevaluation in 1 week here in the clinic. If anything worsens or changes patient will contact our office for additional recommendations. Electronic  Signature(s) Signed: 03/09/2023 1:46:45 PM By: Allen Derry PA-C Entered By: Allen Derry on 03/09/2023 13:46:45 -------------------------------------------------------------------------------- SuperBill Details Patient Name: Date of Service: [name] [facility]. 03/09/2023 Medical Record Number: [id number] Patient Account Number: [id number] Date of Birth/Sex: Treating RN: 07-01-1951 (72 y.o. [name] Primary Care Provider: Kerby Nora Other Clinician: Referring Provider: Treating Provider/Extender: [name], [name] in Treatment: 5 Diagnosis Coding ICD-10 Codes Code Description E11.621 Type 2 diabetes mellitus with foot ulcer L97.512 Non-pressure chronic ulcer of other part of right foot with fat layer exposed I48.0 Paroxysmal atrial fibrillation Z79.01 Long term (current) use of anticoagulants I10 Essential (primary) hypertension I25.10 Atherosclerotic heart disease of native coronary artery without angina pectoris Facility Procedures Physician Procedures : CPT4 Code Description Modifier [id number] - WC PHYS SUBQ TISS 20 SQ CM ICD-10 Diagnosis Description L97.512 Non-pressure chronic ulcer of other part of right foot with fat layer exposed Quantity: 1 Electronic Signature(s) Signed: 03/09/2023 1:46:55 PM By: Allen Derry  PA-C Entered By: Allen Derry on 03/09/2023 13:46:55

## 2023-03-10 ENCOUNTER — Ambulatory Visit (INDEPENDENT_AMBULATORY_CARE_PROVIDER_SITE_OTHER): Payer: PPO | Admitting: Nurse Practitioner

## 2023-03-10 ENCOUNTER — Encounter (INDEPENDENT_AMBULATORY_CARE_PROVIDER_SITE_OTHER): Payer: Self-pay | Admitting: Nurse Practitioner

## 2023-03-10 VITALS — BP 108/73 | HR 76 | Resp 16 | Wt 252.0 lb

## 2023-03-10 DIAGNOSIS — Z794 Long term (current) use of insulin: Secondary | ICD-10-CM | POA: Diagnosis not present

## 2023-03-10 DIAGNOSIS — I7025 Atherosclerosis of native arteries of other extremities with ulceration: Secondary | ICD-10-CM | POA: Diagnosis not present

## 2023-03-10 DIAGNOSIS — I152 Hypertension secondary to endocrine disorders: Secondary | ICD-10-CM | POA: Diagnosis not present

## 2023-03-10 DIAGNOSIS — E11319 Type 2 diabetes mellitus with unspecified diabetic retinopathy without macular edema: Secondary | ICD-10-CM | POA: Diagnosis not present

## 2023-03-10 DIAGNOSIS — L97512 Non-pressure chronic ulcer of other part of right foot with fat layer exposed: Secondary | ICD-10-CM | POA: Diagnosis not present

## 2023-03-10 DIAGNOSIS — E1159 Type 2 diabetes mellitus with other circulatory complications: Secondary | ICD-10-CM

## 2023-03-10 NOTE — H&P (Signed)
H&P Addendum, pre-cardioversion ° °Patient was seen and evaluated prior to -cardioversion procedure °Symptoms, prior testing details again confirmed with the patient °Patient examined, no significant change from prior exam °Lab work reviewed in detail personally by myself °Patient understands risk and benefit of the procedure,  °The risks (stroke, cardiac arrhythmias rarely resulting in the need for a temporary or permanent pacemaker, skin irritation or burns and complications associated with conscious sedation including aspiration, arrhythmia, respiratory failure and death), benefits (restoration of normal sinus rhythm) and alternatives of a direct current cardioversion were explained in detail °Patient willing to proceed. ° °Signed, °Tim Almarie Kurdziel, MD, Ph.D °[facility]  °

## 2023-03-10 NOTE — Interval H&P Note (Signed)
History and Physical Interval Note:  03/10/2023 4:50 PM  [name]  has presented today for surgery, with the diagnosis of Cardioversion   Afib.  The various methods of treatment have been discussed with the patient and family. After consideration of risks, benefits and other options for treatment, the patient has consented to  Procedure(s): CARDIOVERSION (N/A) as a surgical intervention.  [name] history has been reviewed, patient examined, no change in status, stable for surgery.  I have reviewed the patient's chart and labs.  Questions were answered to the patient's satisfaction.     Julien Nordmann

## 2023-03-10 NOTE — Progress Notes (Signed)
SIRUS, DEPPE ([id number]) [id number] Page 1 of 10 Visit Report for 03/09/2023 Arrival Information Details Patient Name: Date of Service: [name] [facility] H. 03/09/2023 1:15 PM Medical Record Number: [id number] Patient Account Number: [id number] Date of Birth/Sex: Treating RN: Apr 22, 1951 (72 y.o. [name] Primary Care Alesa Echevarria: Kerby Nora Other Clinician: Referring Tamicka Shimon: Treating Veta Dambrosia/Extender: [name], [name] in Treatment: 5 Visit Information History Since Last Visit Added or deleted any medications: No Patient Arrived: Cane Any new allergies or adverse reactions: No Arrival Time: 13:05 Has Dressing in Place as Prescribed: Yes Accompanied By: self Pain Present Now: No Transfer Assistance: None Patient Identification Verified: Yes Secondary Verification Process Completed: Yes Patient Requires Transmission-Based Precautions: No Patient Has Alerts: Yes Patient Alerts: Patient on Blood Thinner Diabetes type 2 Eliquis/Plavix ABI R 1.23 TBI 0.57 ABI L 0.98 TBI 0.80 Electronic Signature(s) Signed: 03/09/2023 5:16:05 PM By: Midge Aver MSN RN CNS WTA Entered By: Midge Aver on 03/09/2023 13:05:39 -------------------------------------------------------------------------------- Clinic Level of Care Assessment Details Patient Name: Date of Service: [name] [facility]. 03/09/2023 1:15 PM Medical Record Number: [id number] Patient Account Number: [id number] Date of Birth/Sex: Treating RN: Nov 26, 1950 (72 y.o. [name] Primary Care Rufino Staup: Kerby Nora Other Clinician: Referring Salene Mohamud: Treating Stewart Pimenta/Extender: [name], [name] in Treatment: 5 Clinic Level of Care Assessment Items TOOL 1 Quantity Score []  - 0 Use when EandM and Procedure is performed on INITIAL visit ASSESSMENTS - Nursing Assessment / Reassessment []  - 0 General Physical Exam (combine w/ comprehensive assessment (listed just below) when  performed on new pt. evals) []  - 0 Comprehensive Assessment (HX, ROS, Risk Assessments, Wounds Hx, etc.) YOSGAR, WEISENBERG ([id number]) [id number] Page 2 of 10 ASSESSMENTS - Wound and Skin Assessment / Reassessment []  - 0 Dermatologic / Skin Assessment (not related to wound area) ASSESSMENTS - Ostomy and/or Continence Assessment and Care []  - 0 Incontinence Assessment and Management []  - 0 Ostomy Care Assessment and Management (repouching, etc.) PROCESS - Coordination of Care []  - 0 Simple Patient / Family Education for ongoing care []  - 0 Complex (extensive) Patient / Family Education for ongoing care []  - 0 Staff obtains Chiropractor, Records, T Results / Process Orders est []  - 0 Staff telephones HHA, Nursing Homes / Clarify orders / etc []  - 0 Routine Transfer to another Facility (non-emergent condition) []  - 0 Routine Hospital Admission (non-emergent condition) []  - 0 New Admissions / Manufacturing engineer / Ordering NPWT Apligraf, etc. , []  - 0 Emergency Hospital Admission (emergent condition) PROCESS - Special Needs []  - 0 Pediatric / Minor Patient Management []  - 0 Isolation Patient Management []  - 0 Hearing / Language / Visual special needs []  - 0 Assessment of Community assistance (transportation, D/C planning, etc.) []  - 0 Additional assistance / Altered mentation []  - 0 Support Surface(s) Assessment (bed, cushion, seat, etc.) INTERVENTIONS - Miscellaneous []  - 0 External ear exam []  - 0 Patient Transfer (multiple staff / Nurse, adult / Similar devices) []  - 0 Simple Staple / Suture removal (25 or less) []  - 0 Complex Staple / Suture removal (26 or more) []  - 0 Hypo/Hyperglycemic Management (do not check if billed separately) []  - 0 Ankle / Brachial Index (ABI) - do not check if billed separately Has the patient been seen at the hospital within the last three years: Yes Total Score: 0 Level Of Care: ____ Electronic  Signature(s) Signed: 03/09/2023 5:16:05 PM By: Midge Aver MSN RN CNS WTA Entered By: Midge Aver  on 03/09/2023 13:46:38 -------------------------------------------------------------------------------- Encounter Discharge Information Details Patient Name: Date of Service: [name] [facility] H. 03/09/2023 1:15 PM Medical Record Number: [id number] Patient Account Number: [id number] Date of Birth/Sex: Treating RN: 04/03/1951 (72 y.o. [name] Primary Care Lucilla Petrenko: Kerby Nora Other Clinician: Referring Marquel Spoto: Treating Daphine Loch/Extender: [name], [name] in Treatment: [address] CHRISTOPER, NOGUERAS ([id number]) [id number] Page 3 of 10 Encounter Discharge Information Items Post Procedure Vitals Discharge Condition: Stable Temperature (F): 98.0 Ambulatory Status: Cane Pulse (bpm): 81 Discharge Destination: Home Respiratory Rate (breaths/min): 18 Transportation: Private Auto Blood Pressure (mmHg): 121/67 Accompanied By: self Schedule Follow-up Appointment: Yes Clinical Summary of Care: Electronic Signature(s) Signed: 03/09/2023 5:16:05 PM By: Midge Aver MSN RN CNS WTA Entered By: Midge Aver on 03/09/2023 13:47:32 -------------------------------------------------------------------------------- Lower Extremity Assessment Details Patient Name: Date of Service: [name] [facility] H. 03/09/2023 1:15 PM Medical Record Number: [id number] Patient Account Number: [id number] Date of Birth/Sex: Treating RN: 04/16/1951 (72 y.o. [name] Primary Care Kylieann Eagles: Kerby Nora Other Clinician: Referring Veta Dambrosia: Treating Davan Nawabi/Extender: [name], [name] in Treatment: 5 Edema Assessment Assessed: [Left: No] [Right: No] [Left: Edema] [Right: :] Calf Left: Right: Point of Measurement: 36 cm From Medial Instep 40 cm Ankle Left: Right: Point of Measurement: 18 cm From Medial Instep 24 cm Knee To Floor Left: Right: From Medial Instep 40  cm Vascular Assessment Pulses: Dorsalis Pedis Palpable: [Right:Yes] Electronic Signature(s) Signed: 03/09/2023 5:16:05 PM By: Midge Aver MSN RN CNS WTA Entered By: Midge Aver on 03/09/2023 13:22:07 [name] ([id number]) [id number] Page 4 of 10 -------------------------------------------------------------------------------- Multi Wound Chart Details Patient Name: Date of Service: [name] [facility] H. 03/09/2023 1:15 PM Medical Record Number: [id number] Patient Account Number: [id number] Date of Birth/Sex: Treating RN: 07-09-1951 (72 y.o. [name] Primary Care Vera Furniss: Kerby Nora Other Clinician: Referring Aadarsh Cozort: Treating Shawnese Magner/Extender: [name], [name] in Treatment: 5 Vital Signs Height(in): 66 Pulse(bpm): 81 Weight(lbs): 250 Blood Pressure(mmHg): 121/67 Body Mass Index(BMI): 40.3 Temperature(F): 98.0 Respiratory Rate(breaths/min): 18 [1:Photos:] Right Calcaneus Right, Distal, Dorsal Foot Right, Proximal, Dorsal Foot Wound Location: Gradually Appeared Gradually Appeared Gradually Appeared Wounding Event: Diabetic Wound/Ulcer of the Lower Diabetic Wound/Ulcer of the Lower Diabetic Wound/Ulcer of the Lower Primary Etiology: Extremity Extremity Extremity Congestive Heart Failure, Coronary Congestive Heart Failure, Coronary Congestive Heart Failure, Coronary Comorbid History: Artery Disease, Hypertension, Type II Artery Disease, Hypertension, Type II Artery Disease, Hypertension, Type II Diabetes, Neuropathy Diabetes, Neuropathy Diabetes, Neuropathy 12/10/2022 12/10/2022 12/10/2022 Date Acquired: 5 5 5  Weeks of Treatment: Open Open Open Wound Status: No No No Wound Recurrence: Yes No No Clustered Wound: 3x0.8x0.3 1x0.8x0.2 1.8x2x0.2 Measurements L x W x D (cm) 1.885 0.628 2.827 A (cm) : rea 0.565 0.126 0.565 Volume (cm) : 55.90% 27.30% -12.50% % Reduction in A rea: -32.30% -46.50% -125.10% % Reduction in  Volume: Grade 1 Grade 1 Grade 1 Classification: Medium Medium Medium Exudate A mount: Serosanguineous Serosanguineous Serosanguineous Exudate Type: red, brown red, brown red, brown Exudate Color: Medium (34-66%) None Present (0%) None Present (0%) Granulation A mount: Red N/A N/A Granulation Quality: None Present (0%) None Present (0%) Large (67-100%) Necrotic A mount: Fat Layer (Subcutaneous Tissue): Yes Fat Layer (Subcutaneous Tissue): Yes Fat Layer (Subcutaneous Tissue): Yes Exposed Structures: Fascia: No Fascia: No Fascia: No Tendon: No Tendon: No Tendon: No Muscle: No Muscle: No Muscle: No Joint: No Joint: No Joint: No Bone: No Bone: No Bone: No None Small (1-33%) Small (1-33%) Epithelialization: Treatment Notes Electronic Signature(s) Signed:  03/09/2023 5:16:05 PM By: Midge Aver MSN RN CNS WTA Entered By: Midge Aver on 03/09/2023 13:41:45 Amaral, [name] ([id number]) [id number] Page 5 of 10 -------------------------------------------------------------------------------- Pain Assessment Details Patient Name: Date of Service: [name] [facility]. 03/09/2023 1:15 PM Medical Record Number: [id number] Patient Account Number: [id number] Date of Birth/Sex: Treating RN: February 11, 1951 (72 y.o. [name] Primary Care Dwane Andres: Kerby Nora Other Clinician: Referring Kenny Rea: Treating Shamaria Kavan/Extender: [name], [name] in Treatment: 5 Active Problems Location of Pain Severity and Description of Pain Patient Has Paino No Site Locations Pain Management and Medication Current Pain Management: Electronic Signature(s) Signed: 03/09/2023 5:16:05 PM By: Midge Aver MSN RN CNS WTA Entered By: Midge Aver on 03/09/2023 13:09:03 -------------------------------------------------------------------------------- Wound Assessment Details Patient Name: Date of Service: [name] [facility] H. 03/09/2023 1:15 PM Medical Record Number:  [id number] Patient Account Number: [id number] Date of Birth/Sex: Treating RN: [birth year] (72 y.o. [name] Primary Care Yolunda Kloos: Kerby Nora Other Clinician: Referring Capers Hagmann: Treating Elissa Grieshop/Extender: [name], [name] in Treatment: [address] HAWKINS, DUYCK ([id number]) [id number] Page 6 of 10 Wound Status Wound Number: 1 Primary Diabetic Wound/Ulcer of the Lower Extremity Etiology: Wound Location: Right Calcaneus Wound Open Wounding Event: Gradually Appeared Status: Date Acquired: 12/10/2022 Comorbid Congestive Heart Failure, Coronary Artery Disease, Weeks Of Treatment: 5 History: Hypertension, Type II Diabetes, Neuropathy Clustered Wound: Yes Photos Wound Measurements Length: (cm) 3 Width: (cm) 0.8 Depth: (cm) 0.3 Area: (cm) 1.885 Volume: (cm) 0.565 % Reduction in Area: 55.9% % Reduction in Volume: -32.3% Epithelialization: None Wound Description Classification: Grade 1 Exudate Amount: Medium Exudate Type: Serosanguineous Exudate Color: red, brown Foul Odor After Cleansing: No Slough/Fibrino No Wound Bed Granulation Amount: Medium (34-66%) Exposed Structure Granulation Quality: Red Fascia Exposed: No Necrotic Amount: None Present (0%) Fat Layer (Subcutaneous Tissue) Exposed: Yes Tendon Exposed: No Muscle Exposed: No Joint Exposed: No Bone Exposed: No Treatment Notes Wound #1 (Calcaneus) Wound Laterality: Right Cleanser Byram Ancillary Kit - 15 Day Supply Discharge Instruction: Use supplies as instructed; Kit contains: (15) Saline Bullets; (15) 3x3 Gauze; 15 pr Gloves Soap and Water Discharge Instruction: Gently cleanse wound with antibacterial soap, rinse and pat dry prior to dressing wounds Peri-Wound Care Topical Primary Dressing IODOFLEX 0.9% Cadexomer Iodine Pad Discharge Instruction: Apply Iodoflex to wound bed only as directed. Secondary Dressing (BORDER) Zetuvit Plus SILICONE BORDER Dressing 4x4  (in/in) Discharge Instruction: Please do not put silicone bordered dressings under wraps. Use non-bordered dressing only. Secured With [employer] Compression Stockings Add-Ons OSSIAN, LIEBOLD ([id number]) [id number] Page 7 of 10 Electronic Signature(s) Signed: 03/09/2023 5:16:05 PM By: Midge Aver MSN RN CNS WTA Entered By: Midge Aver on 03/09/2023 13:20:47 -------------------------------------------------------------------------------- Wound Assessment Details Patient Name: Date of Service: [name] [facility] H. 03/09/2023 1:15 PM Medical Record Number: [id number] Patient Account Number: [id number] Date of Birth/Sex: Treating RN: [birth year] (72 y.o. [name] Primary Care Boots Mcglown: Kerby Nora Other Clinician: Referring Vasilia Dise: Treating Zannah Melucci/Extender: [name], [name] in Treatment: 5 Wound Status Wound Number: 2 Primary Diabetic Wound/Ulcer of the Lower Extremity Etiology: Wound Location: Right, Distal, Dorsal Foot Wound Open Wounding Event: Gradually Appeared Status: Date Acquired: 12/10/2022 Comorbid Congestive Heart Failure, Coronary Artery Disease, Weeks Of Treatment: 5 History: Hypertension, Type II Diabetes, Neuropathy Clustered Wound: No Photos Wound Measurements Length: (cm) 1 Width: (cm) 0.8 Depth: (cm) 0.2 Area: (cm) 0.628 Volume: (cm) 0.126 % Reduction in Area: 27.3% % Reduction in Volume: -46.5% Epithelialization: Small (1-33%) Wound Description Classification: Grade 1 Exudate  Amount: Medium Exudate Type: Serosanguineous Exudate Color: red, brown Foul Odor After Cleansing: No Slough/Fibrino No Wound Bed Granulation Amount: None Present (0%) Exposed Structure Necrotic Amount: None Present (0%) Fascia Exposed: No Fat Layer (Subcutaneous Tissue) Exposed: Yes Tendon Exposed: No Muscle Exposed: No Joint Exposed: No Bone Exposed: No Treatment Notes ERCOLE, IDEMA ([id number])  [id number] Page 8 of 10 Wound #2 (Foot) Wound Laterality: Dorsal, Right, Distal Cleanser Byram Ancillary Kit - 15 Day Supply Discharge Instruction: Use supplies as instructed; Kit contains: (15) Saline Bullets; (15) 3x3 Gauze; 15 pr Gloves Soap and Water Discharge Instruction: Gently cleanse wound with antibacterial soap, rinse and pat dry prior to dressing wounds Peri-Wound Care Topical Primary Dressing IODOFLEX 0.9% Cadexomer Iodine Pad Discharge Instruction: Apply Iodoflex to wound bed only as directed. Secondary Dressing (BORDER) Zetuvit Plus SILICONE BORDER Dressing 4x4 (in/in) Discharge Instruction: Please do not put silicone bordered dressings under wraps. Use non-bordered dressing only. Secured With Compression Wrap Compression Stockings Facilities manager) Signed: 03/09/2023 5:16:05 PM By: Midge Aver MSN RN CNS WTA Entered By: Midge Aver on 03/09/2023 13:21:07 -------------------------------------------------------------------------------- Wound Assessment Details Patient Name: Date of Service: [name] [facility] H. 03/09/2023 1:15 PM Medical Record Number: [id number] Patient Account Number: [id number] Date of Birth/Sex: Treating RN: 09/15/1950 (72 y.o. [name] Primary Care Matea Stanard: Kerby Nora Other Clinician: Referring Jyquan Kenley: Treating Kallee Nam/Extender: [name], [name] in Treatment: 5 Wound Status Wound Number: 3 Primary Diabetic Wound/Ulcer of the Lower Extremity Etiology: Wound Location: Right, Proximal, Dorsal Foot Wound Open Wounding Event: Gradually Appeared Status: Date Acquired: 12/10/2022 Comorbid Congestive Heart Failure, Coronary Artery Disease, Weeks Of Treatment: 5 History: Hypertension, Type II Diabetes, Neuropathy Clustered Wound: No Photos JAQUAIL, KLUESNER ([id number]) [id number] Page 9 of 10 Wound Measurements Length: (cm) 1.8 Width: (cm) 2 Depth: (cm)  0.2 Area: (cm) 2.827 Volume: (cm) 0.565 % Reduction in Area: -12.5% % Reduction in Volume: -125.1% Epithelialization: Small (1-33%) Wound Description Classification: Grade 1 Exudate Amount: Medium Exudate Type: Serosanguineous Exudate Color: red, brown Foul Odor After Cleansing: No Slough/Fibrino Yes Wound Bed Granulation Amount: None Present (0%) Exposed Structure Necrotic Amount: Large (67-100%) Fascia Exposed: No Necrotic Quality: Adherent Slough Fat Layer (Subcutaneous Tissue) Exposed: Yes Tendon Exposed: No Muscle Exposed: No Joint Exposed: No Bone Exposed: No Treatment Notes Wound #3 (Foot) Wound Laterality: Dorsal, Right, Proximal Cleanser Soap and Water Discharge Instruction: Gently cleanse wound with antibacterial soap, rinse and pat dry prior to dressing wounds Peri-Wound Care Topical Primary Dressing IODOFLEX 0.9% Cadexomer Iodine Pad Discharge Instruction: Apply Iodoflex to wound bed only as directed. Secondary Dressing (BORDER) Zetuvit Plus SILICONE BORDER Dressing 4x4 (in/in) Discharge Instruction: Please do not put silicone bordered dressings under wraps. Use non-bordered dressing only. Secured With Compression Wrap Compression Stockings Facilities manager) Signed: 03/09/2023 5:16:05 PM By: Midge Aver MSN RN CNS WTA Entered By: Midge Aver on 03/09/2023 13:21:36 -------------------------------------------------------------------------------- Vitals Details Patient Name: Date of Service: [name] [facility] H. 03/09/2023 1:15 PM Medical Record Number: [id number] Patient Account Number: [id number] Date of Birth/Sex: Treating RN: [birth year] (72 y.o. [name] Primary Care Yahye Siebert: Kerby Nora Other Clinician: Referring Malaka Ruffner: Treating Karlena Luebke/Extender: Donevan, Clyne ([id number]) [id number] Page 10 of 10 Weeks in Treatment: 5 Vital Signs Time Taken: 13:06 Temperature (F):  98.0 Height (in): 66 Pulse (bpm): 81 Weight (lbs): 250 Respiratory Rate (breaths/min): 18 Body Mass Index (BMI): 40.3 Blood Pressure (mmHg): 121/67 Reference Range: 80 - 120 mg / dl Electronic Signature(s) Signed: 03/09/2023  5:16:05 PM By: Midge Aver MSN RN CNS WTA Entered By: Midge Aver on 03/09/2023 13:08:57

## 2023-03-11 ENCOUNTER — Encounter (INDEPENDENT_AMBULATORY_CARE_PROVIDER_SITE_OTHER): Payer: Self-pay | Admitting: Nurse Practitioner

## 2023-03-11 NOTE — H&P (View-Only) (Signed)
Subjective:    Patient ID: [name], male    DOB: [birth year], 72 y.o.   MRN: [id number] Chief Complaint  Patient presents with   New Patient (Initial Visit)    Ref Larina Bras consult abnormal rle flow with recent ABI    The patient is seen for evaluation of painful lower extremities and diminished pulses associated with ulceration of the foot.  1 wound has been present for nearly 2 years and the others have been present for several months.  He has been receiving excellent wound care at the wound care center but despite this the wounds have not been healing well.  The patient denies fever or chills.  the patient does have diabetes which has been difficult to control.   The patient denies rest pain or dangling of an extremity off the side of the bed during the night for relief. No prior interventions or surgeries.  No history of back problems or DJD of the lumbar sacral spine.   The patient denies amaurosis fugax or recent TIA symptoms. There are no recent neurological changes noted. The patient denies history of DVT, PE or superficial thrombophlebitis. The patient denies recent episodes of angina or shortness of breath.   The patient has noncompressible right with a TBI 0.57.  The patient has ABI 0.98 on the left and TBI 0.80.  Patient has monophasic tibial vessel waveforms bilaterally.    Review of Systems  Musculoskeletal:  Positive for arthralgias and gait problem.  Skin:  Positive for wound.  All other systems reviewed and are negative.      Objective:   Physical Exam Vitals reviewed.  HENT:     Head: Normocephalic.  Cardiovascular:     Rate and Rhythm: Normal rate and regular rhythm.     Pulses:          Dorsalis pedis pulses are detected w/ Doppler on the right side and detected w/ Doppler on the left side.       Posterior tibial pulses are detected w/ Doppler on the right side and detected w/ Doppler on the left side.  Pulmonary:     Effort: Pulmonary effort is  normal.  Skin:    General: Skin is warm and dry.  Neurological:     Mental Status: He is alert and oriented to person, place, and time.     Gait: Gait abnormal.  Psychiatric:        Mood and Affect: Mood normal.        Behavior: Behavior normal.        Thought Content: Thought content normal.        Judgment: Judgment normal.     BP 108/73 (BP Location: Right Arm)   Pulse 76   Resp 16   Wt 252 lb (114.3 kg)   BMI 40.67 kg/m   Past Medical History:  Diagnosis Date   Back injury 02/2002   worker's comp   CHF (congestive heart failure) (HCC)    Coronary artery disease, non-occlusive    a. cath 2002 with no sig CAD;  b. cath 2008 normal LM, LAD, LCx, p&dRCA 20-30%, PDA 30%; c.11/2015 NSTEMI/PCI: LM nl, LAD 95p (2.5x15 Xience DES), LCX nl, RCA 100p/m w/ L->R collats, EF 55-65% c. NSTEMI (02/2016) with no culprit leision, switched to Brilinta.  d. NSTEMI 03/2016: again, no culprit lesion and switched back to plavix 2/2 SOB with Brilnta.     Depression    Diabetes mellitus type 2, insulin dependent (HCC)  Hyperlipemia    Hypertension    Hypertensive heart disease    Kidney stones    Morbid obesity (HCC)    Osteoarthritis    Snoring     Social History   Socioeconomic History   Marital status: Widowed    Spouse name: Not on file   Number of children: 3   Years of education: Not on file   Highest education level: 7th grade  Occupational History   Occupation: disabled    Associate Professor: UNEMPLOYED    Comment: back injury  Tobacco Use   Smoking status: Never   Smokeless tobacco: Never  Vaping Use   Vaping status: Never Used  Substance and Sexual Activity   Alcohol use: No    Alcohol/week: 0.0 standard drinks of alcohol   Drug use: No   Sexual activity: Not Currently  Other Topics Concern   Not on file  Social History Narrative   Has a roommate, Mr. [name]. No pets.   Social Determinants of Health   Financial Resource Strain: Low Risk  (11/23/2022)   Overall Financial  Resource Strain (CARDIA)    Difficulty of Paying Living Expenses: Not hard at all  Food Insecurity: No Food Insecurity (12/29/2022)   Hunger Vital Sign    Worried About Running Out of Food in the Last Year: Never true    Ran Out of Food in the Last Year: Never true  Transportation Needs: No Transportation Needs (12/29/2022)   PRAPARE - Administrator, Civil Service (Medical): No    Lack of Transportation (Non-Medical): No  Physical Activity: Inactive (11/09/2022)   Exercise Vital Sign    Days of Exercise per Week: 0 days    Minutes of Exercise per Session: 0 min  Stress: No Stress Concern Present (11/09/2022)   [employer] of Occupational Health - Occupational Stress Questionnaire    Feeling of Stress : Only a little  Social Connections: Moderately Isolated (11/09/2022)   Social Connection and Isolation Panel [NHANES]    Frequency of Communication with Friends and Family: More than three times a week    Frequency of Social Gatherings with Friends and Family: More than three times a week    Attends Religious Services: More than 4 times per year    Active Member of [employer] or Organizations: No    Attends Banker Meetings: Never    Marital Status: Widowed  Intimate Partner Violence: Not At Risk (11/09/2022)   Humiliation, Afraid, Rape, and Kick questionnaire    Fear of Current or Ex-Partner: No    Emotionally Abused: No    Physically Abused: No    Sexually Abused: No    Past Surgical History:  Procedure Laterality Date   CARDIAC CATHETERIZATION  09/29/2000   diffuse LAD 30% LCA  EF 50-60%   CARDIAC CATHETERIZATION  06/30/2007   no significant CAD   CARDIAC CATHETERIZATION N/A 12/25/2015   Procedure: Left Heart Cath and Coronary Angiography;  Surgeon: Antonieta Iba, MD;  Location: [facility] INVASIVE CV LAB;  Service: Cardiovascular;  Laterality: N/A;   CARDIAC CATHETERIZATION N/A 12/25/2015   Procedure: Coronary Stent Intervention;  Surgeon: Alwyn Pea,  MD;  Location: [facility] INVASIVE CV LAB;  Service: Cardiovascular;  Laterality: N/A;   CARDIAC CATHETERIZATION N/A 03/10/2016   Procedure: Left Heart Cath and Coronary Angiography;  Surgeon: Iran Ouch, MD;  Location: [facility] INVASIVE CV LAB;  Service: Cardiovascular;  Laterality: N/A;   CARDIAC CATHETERIZATION N/A 04/06/2016   Procedure: Left Heart Cath and  Coronary Angiography;  Surgeon: Lyn Records, MD;  Location: [facility] INVASIVE CV LAB;  Service: Cardiovascular;  Laterality: N/A;   CARDIOVERSION N/A 03/03/2023   Procedure: CARDIOVERSION;  Surgeon: Antonieta Iba, MD;  Location: [facility] ORS;  Service: Cardiovascular;  Laterality: N/A;   CIRCUMCISION     CORONARY ARTERY BYPASS GRAFT      Family History  Problem Relation Age of Onset   Alzheimer's disease Mother    Emphysema Mother    Diabetes Father    Heart disease Father        MI   Cancer Brother        ? Neck cancer    Allergies  Allergen Reactions   Bupropion Nausea Only   Ozempic (0.25 Or 0.5 Mg-Dose) [Semaglutide(0.25 Or 0.5mg -Dos)] Nausea And Vomiting   Atorvastatin Other (See Comments)    Body aches Similar effect with rosuvastatin 40 mg twice weekly       Latest Ref Rng & Units 02/28/2023   12:31 PM 01/30/2023    4:45 PM 11/08/2022    7:08 AM  CBC  WBC 4.0 - 10.5 K/uL 8.2  8.4  6.2   Hemoglobin 13.0 - 17.0 g/dL 82.9  56.2  13.0   Hematocrit 39.0 - 52.0 % 40.6  40.7  35.3   Platelets 150 - 400 K/uL 248  256  231       CMP     Component Value Date/Time   NA 138 02/28/2023 1231   K 4.2 02/28/2023 1231   CL 103 02/28/2023 1231   CO2 27 02/28/2023 1231   GLUCOSE 152 (H) 02/28/2023 1231   BUN 27 (H) 02/28/2023 1231   CREATININE 1.01 02/28/2023 1231   CALCIUM 8.6 (L) 02/28/2023 1231   PROT 7.2 11/05/2022 0824   ALBUMIN 3.5 11/05/2022 0824   AST 31 11/05/2022 0824   ALT 16 11/05/2022 0824   ALKPHOS 73 11/05/2022 0824   BILITOT 0.8 11/05/2022 0824   GFR 71.68 11/02/2020 0842   GFRNONAA >60 02/28/2023 1231      VAS [place] ABI WITH/WO TBI  Result Date: 02/20/2023  LOWER EXTREMITY DOPPLER STUDY Patient Name:  HALSEY CRUMEDY  Date of Exam:   02/16/2023 Medical Rec #: [id number]      Accession #:    [id number] Date of Birth: [birth year]      Patient Gender: M Patient Age:   29 years Exam Location:  [facility] Procedure:      VAS [place] ABI WITH/WO TBI Referring Phys: --------------------------------------------------------------------------------  Indications: Peripheral artery disease. rt foot ulcers/non-healing wounds  Performing Technologist: Salvadore Farber RVT  Examination Guidelines: A complete evaluation includes at minimum, Doppler waveform signals and systolic blood pressure reading at the level of bilateral brachial, anterior tibial, and posterior tibial arteries, when vessel segments are accessible. Bilateral testing is considered an integral part of a complete examination. Photoelectric Plethysmograph (PPG) waveforms and toe systolic pressure readings are included as required and additional duplex testing as needed. Limited examinations for reoccurring indications may be performed as noted.  ABI Findings: +---------+------------------+-----+----------+--------+ Right    Rt Pressure (mmHg)IndexWaveform  Comment  +---------+------------------+-----+----------+--------+ Brachial 119                                       +---------+------------------+-----+----------+--------+ ATA      50                0.42  monophasic         +---------+------------------+-----+----------+--------+ PTA      147               1.23 monophasicNonComp  +---------+------------------+-----+----------+--------+ Great Toe69                0.57 Abnormal           +---------+------------------+-----+----------+--------+ +---------+------------------+-----+----------+-------+ Left     Lt Pressure (mmHg)IndexWaveform  Comment +---------+------------------+-----+----------+-------+ Brachial 120                                       +---------+------------------+-----+----------+-------+ ATA      118               0.98 monophasic        +---------+------------------+-----+----------+-------+ PTA      94                0.78 monophasic        +---------+------------------+-----+----------+-------+ Great Toe96                0.80 Abnormal          +---------+------------------+-----+----------+-------+  Summary: Right: Resting right ankle-brachial index indicates noncompressible right lower extremity arteries. The right toe-brachial index is abnormal. PPG tracings appear dampened. TBI suggests moderate disease. Left: Resting left ankle-brachial index is within normal range. The left toe-brachial index is normal. *See table(s) above for measurements and observations.  Electronically signed by Levora Dredge MD on 02/20/2023 at 11:21:11 AM.    Final        Assessment & Plan:   1. Atherosclerosis of native arteries of the extremities with ulceration (HCC)  Recommend:  The patient has evidence of severe atherosclerotic changes of both lower extremities associated with ulceration and tissue loss of the right foot.  This represents a limb threatening ischemia and places the patient at the risk for right limb loss.  Patient should undergo angiography of the right lower extremity with the hope for intervention for limb salvage.  The risks and benefits as well as the alternative therapies was discussed in detail with the patient.  All questions were answered.  Patient agrees to proceed with right angiography.  The patient will follow up with me in the office after the procedure.   2. Type 2 diabetes mellitus with retinopathy of both eyes, with long-term current use of insulin, macular edema presence unspecified, unspecified retinopathy severity (HCC) Continue hypoglycemic medications as already ordered, these medications have been reviewed and there are no changes at this time.  Hgb A1C  to be monitored as already arranged by primary service  3. Right foot ulcer, with fat layer exposed (HCC) Continue to follow-up with wound care for treatment.  4. Hypertension associated with diabetes (HCC) Continue antihypertensive medications as already ordered, these medications have been reviewed and there are no changes at this time.   Current Outpatient Medications on File Prior to Visit  Medication Sig Dispense Refill   Acetaminophen (TYLENOL PO) Take 3-4 tablets by mouth as needed (pain).     apixaban (ELIQUIS) 5 MG TABS tablet Take 1 tablet (5 mg total) by mouth 2 (two) times daily. 56 tablet 0   carvedilol (COREG) 12.5 MG tablet Take 1 tablet (12.5 mg total) by mouth 2 (two) times daily with a meal. 180 tablet 3   cholecalciferol (VITAMIN D3) 25 MCG (1000 UNIT) tablet Take 2,000 Units by mouth daily.  clopidogrel (PLAVIX) 75 MG tablet TAKE ONE TABLET BY MOUTH ONCE DAILY WITH BREAKFAST 90 tablet 3   Continuous Blood Gluc Sensor (FREESTYLE LIBRE 2 SENSOR) MISC APPLY SENSOR EVERY 14 DAYS TO MONITOR SUGAR CONTINOUSLY 2 each 11   Continuous Glucose Receiver (FREESTYLE LIBRE 2 READER) DEVI USE WITH SENSORS TO MONITOR SUGAR CONTINUOUSLY 1 each 0   Dulaglutide (TRULICITY) 4.5 MG/0.5ML SOPN Inject 4.5 mg into the skin once a week. Via [employer] PAP     empagliflozin (JARDIANCE) 10 MG TABS tablet Take 1 tablet (10 mg total) by mouth daily. 30 tablet 5   ezetimibe (ZETIA) 10 MG tablet TAKE ONE TABLET BY MOUTH ONCE DAILY 30 tablet 2   furosemide (LASIX) 40 MG tablet TAKE ONE TABLET BY MOUTH ONCE A DAY CAN TAKE A 2ND DAILY DOSE AS NEEDED. 180 tablet 1   HYDROcodone-acetaminophen (NORCO) 5-325 MG tablet Take 1-2 tablets by mouth daily as needed for moderate pain. 60 tablet 0   insulin aspart (NOVOLOG FLEXPEN) 100 UNIT/ML FlexPen 13 units in AM (scheduled) and 3 units PRN in evening (Patient taking differently: Inject 3-13 Units into the skin in the morning and at bedtime. Inject 13 units in  the morning and 3 units at night) 15 mL 6   sacubitril-valsartan (ENTRESTO) 24-26 MG Take 1 tablet by mouth 2 (two) times daily. 60 tablet 3   spironolactone (ALDACTONE) 25 MG tablet Take 1 tablet (25 mg total) by mouth daily. 30 tablet 5   terbinafine (LAMISIL) 250 MG tablet TAKE ONE TABLET (250 MG TOTAL) BY MOUTH DAILY. 30 tablet 0   TRESIBA FLEXTOUCH 100 UNIT/ML FlexTouch Pen INJECT 50 UNITS INTO THE SKIN DAILY 15 mL 2   triamcinolone cream (KENALOG) 0.5 % APPLY ONE APPLICATION TOPICALLY TWO (TWO) TIMES DAILY. 30 g 0   TRUEPLUS 5-BEVEL PEN NEEDLES 31G X 6 MM MISC USE TO INJECT INSULIN THREE TIMES A DAY 300 each 3   venlafaxine XR (EFFEXOR-XR) 150 MG 24 hr capsule TAKE 1 CAPSULE BY MOUTH DAILY WITH BREAKFAST. TAKE WITH EFFEXOR XR 75MG  FORA TOTAL OF 225MG  (Patient taking differently: Take 150 mg by mouth daily with breakfast. Take with 75 mg) 90 capsule 0   vitamin B-12 (CYANOCOBALAMIN) 1000 MCG tablet Take 1,000 mcg by mouth daily.     guaiFENesin-dextromethorphan (ROBITUSSIN DM) 100-10 MG/5ML syrup Take 5 mLs by mouth every 4 (four) hours as needed for cough. (Patient not taking: Reported on 03/08/2023) 118 mL 0   HYDROcodone-acetaminophen (NORCO) 5-325 MG tablet Take 1-2 tablets by mouth daily as needed for moderate pain. April (Patient not taking: Reported on 03/08/2023) 60 tablet 0   HYDROcodone-acetaminophen (NORCO/VICODIN) 5-325 MG tablet TAKE ONE (1) TO TWO (2) TABLETS BY MOUTH DAILY AS NEEDED FOR MODERATE PAIN. MAY (Patient not taking: Reported on 03/08/2023) 60 tablet 0   hydrOXYzine (ATARAX) 10 MG tablet TAKE 1 TABLET BY MOUTH 3 TIMES DAILY AS NEEDED FOR ANXIETY (Patient not taking: Reported on 03/08/2023) 30 tablet 2   isosorbide mononitrate (IMDUR) 30 MG 24 hr tablet Take 1 tablet (30 mg total) by mouth 2 (two) times daily. 60 tablet 2   ketoconazole (NIZORAL) 2 % cream Apply 1 Application topically daily. (Patient not taking: Reported on 02/24/2023) 15 g 0   nitroGLYCERIN (NITROSTAT) 0.4 MG  SL tablet DISSOLVE 1 TABLET UNDER TONGUE AS NEEDEDFOR CHEST PAIN. MAY REPEAT 5 MINUTES APART 3 TIMES IF NEEDED (Patient not taking: Reported on 02/24/2023) 25 tablet 3   No current facility-administered medications on file prior  to visit.    There are no Patient Instructions on file for this visit. No follow-ups on file.   Georgiana Spinner, NP

## 2023-03-11 NOTE — Progress Notes (Signed)
Subjective:    Patient ID: [name], male    DOB: [birth year], 72 y.o.   MRN: [id number] Chief Complaint  Patient presents with   New Patient (Initial Visit)    Ref Larina Bras consult abnormal rle flow with recent ABI    The patient is seen for evaluation of painful lower extremities and diminished pulses associated with ulceration of the foot.  1 wound has been present for nearly 2 years and the others have been present for several months.  He has been receiving excellent wound care at the wound care center but despite this the wounds have not been healing well.  The patient denies fever or chills.  the patient does have diabetes which has been difficult to control.   The patient denies rest pain or dangling of an extremity off the side of the bed during the night for relief. No prior interventions or surgeries.  No history of back problems or DJD of the lumbar sacral spine.   The patient denies amaurosis fugax or recent TIA symptoms. There are no recent neurological changes noted. The patient denies history of DVT, PE or superficial thrombophlebitis. The patient denies recent episodes of angina or shortness of breath.   The patient has noncompressible right with a TBI 0.57.  The patient has ABI 0.98 on the left and TBI 0.80.  Patient has monophasic tibial vessel waveforms bilaterally.    Review of Systems  Musculoskeletal:  Positive for arthralgias and gait problem.  Skin:  Positive for wound.  All other systems reviewed and are negative.      Objective:   Physical Exam Vitals reviewed.  HENT:     Head: Normocephalic.  Cardiovascular:     Rate and Rhythm: Normal rate and regular rhythm.     Pulses:          Dorsalis pedis pulses are detected w/ Doppler on the right side and detected w/ Doppler on the left side.       Posterior tibial pulses are detected w/ Doppler on the right side and detected w/ Doppler on the left side.  Pulmonary:     Effort: Pulmonary effort is  normal.  Skin:    General: Skin is warm and dry.  Neurological:     Mental Status: He is alert and oriented to person, place, and time.     Gait: Gait abnormal.  Psychiatric:        Mood and Affect: Mood normal.        Behavior: Behavior normal.        Thought Content: Thought content normal.        Judgment: Judgment normal.     BP 108/73 (BP Location: Right Arm)   Pulse 76   Resp 16   Wt 252 lb (114.3 kg)   BMI 40.67 kg/m   Past Medical History:  Diagnosis Date   Back injury 02/2002   worker's comp   CHF (congestive heart failure) (HCC)    Coronary artery disease, non-occlusive    a. cath 2002 with no sig CAD;  b. cath 2008 normal LM, LAD, LCx, p&dRCA 20-30%, PDA 30%; c.11/2015 NSTEMI/PCI: LM nl, LAD 95p (2.5x15 Xience DES), LCX nl, RCA 100p/m w/ L->R collats, EF 55-65% c. NSTEMI (02/2016) with no culprit leision, switched to Brilinta.  d. NSTEMI 03/2016: again, no culprit lesion and switched back to plavix 2/2 SOB with Brilnta.     Depression    Diabetes mellitus type 2, insulin dependent (HCC)  Hyperlipemia    Hypertension    Hypertensive heart disease    Kidney stones    Morbid obesity (HCC)    Osteoarthritis    Snoring     Social History   Socioeconomic History   Marital status: Widowed    Spouse name: Not on file   Number of children: 3   Years of education: Not on file   Highest education level: 7th grade  Occupational History   Occupation: disabled    Associate Professor: UNEMPLOYED    Comment: back injury  Tobacco Use   Smoking status: Never   Smokeless tobacco: Never  Vaping Use   Vaping status: Never Used  Substance and Sexual Activity   Alcohol use: No    Alcohol/week: 0.0 standard drinks of alcohol   Drug use: No   Sexual activity: Not Currently  Other Topics Concern   Not on file  Social History Narrative   Has a roommate, Mr. [name]. No pets.   Social Determinants of Health   Financial Resource Strain: Low Risk  (11/23/2022)   Overall Financial  Resource Strain (CARDIA)    Difficulty of Paying Living Expenses: Not hard at all  Food Insecurity: No Food Insecurity (12/29/2022)   Hunger Vital Sign    Worried About Running Out of Food in the Last Year: Never true    Ran Out of Food in the Last Year: Never true  Transportation Needs: No Transportation Needs (12/29/2022)   PRAPARE - Administrator, Civil Service (Medical): No    Lack of Transportation (Non-Medical): No  Physical Activity: Inactive (11/09/2022)   Exercise Vital Sign    Days of Exercise per Week: 0 days    Minutes of Exercise per Session: 0 min  Stress: No Stress Concern Present (11/09/2022)   [employer] of Occupational Health - Occupational Stress Questionnaire    Feeling of Stress : Only a little  Social Connections: Moderately Isolated (11/09/2022)   Social Connection and Isolation Panel [NHANES]    Frequency of Communication with Friends and Family: More than three times a week    Frequency of Social Gatherings with Friends and Family: More than three times a week    Attends Religious Services: More than 4 times per year    Active Member of [employer] or Organizations: No    Attends Banker Meetings: Never    Marital Status: Widowed  Intimate Partner Violence: Not At Risk (11/09/2022)   Humiliation, Afraid, Rape, and Kick questionnaire    Fear of Current or Ex-Partner: No    Emotionally Abused: No    Physically Abused: No    Sexually Abused: No    Past Surgical History:  Procedure Laterality Date   CARDIAC CATHETERIZATION  09/29/2000   diffuse LAD 30% LCA  EF 50-60%   CARDIAC CATHETERIZATION  06/30/2007   no significant CAD   CARDIAC CATHETERIZATION N/A 12/25/2015   Procedure: Left Heart Cath and Coronary Angiography;  Surgeon: Antonieta Iba, MD;  Location: [facility] INVASIVE CV LAB;  Service: Cardiovascular;  Laterality: N/A;   CARDIAC CATHETERIZATION N/A 12/25/2015   Procedure: Coronary Stent Intervention;  Surgeon: Alwyn Pea,  MD;  Location: [facility] INVASIVE CV LAB;  Service: Cardiovascular;  Laterality: N/A;   CARDIAC CATHETERIZATION N/A 03/10/2016   Procedure: Left Heart Cath and Coronary Angiography;  Surgeon: Iran Ouch, MD;  Location: [facility] INVASIVE CV LAB;  Service: Cardiovascular;  Laterality: N/A;   CARDIAC CATHETERIZATION N/A 04/06/2016   Procedure: Left Heart Cath and  Coronary Angiography;  Surgeon: Lyn Records, MD;  Location: [facility] INVASIVE CV LAB;  Service: Cardiovascular;  Laterality: N/A;   CARDIOVERSION N/A 03/03/2023   Procedure: CARDIOVERSION;  Surgeon: Antonieta Iba, MD;  Location: [facility] ORS;  Service: Cardiovascular;  Laterality: N/A;   CIRCUMCISION     CORONARY ARTERY BYPASS GRAFT      Family History  Problem Relation Age of Onset   Alzheimer's disease Mother    Emphysema Mother    Diabetes Father    Heart disease Father        MI   Cancer Brother        ? Neck cancer    Allergies  Allergen Reactions   Bupropion Nausea Only   Ozempic (0.25 Or 0.5 Mg-Dose) [Semaglutide(0.25 Or 0.5mg -Dos)] Nausea And Vomiting   Atorvastatin Other (See Comments)    Body aches Similar effect with rosuvastatin 40 mg twice weekly       Latest Ref Rng & Units 02/28/2023   12:31 PM 01/30/2023    4:45 PM 11/08/2022    7:08 AM  CBC  WBC 4.0 - 10.5 K/uL 8.2  8.4  6.2   Hemoglobin 13.0 - 17.0 g/dL 82.9  56.2  13.0   Hematocrit 39.0 - 52.0 % 40.6  40.7  35.3   Platelets 150 - 400 K/uL 248  256  231       CMP     Component Value Date/Time   NA 138 02/28/2023 1231   K 4.2 02/28/2023 1231   CL 103 02/28/2023 1231   CO2 27 02/28/2023 1231   GLUCOSE 152 (H) 02/28/2023 1231   BUN 27 (H) 02/28/2023 1231   CREATININE 1.01 02/28/2023 1231   CALCIUM 8.6 (L) 02/28/2023 1231   PROT 7.2 11/05/2022 0824   ALBUMIN 3.5 11/05/2022 0824   AST 31 11/05/2022 0824   ALT 16 11/05/2022 0824   ALKPHOS 73 11/05/2022 0824   BILITOT 0.8 11/05/2022 0824   GFR 71.68 11/02/2020 0842   GFRNONAA >60 02/28/2023 1231      VAS [place] ABI WITH/WO TBI  Result Date: 02/20/2023  LOWER EXTREMITY DOPPLER STUDY Patient Name:  HALSEY CRUMEDY  Date of Exam:   02/16/2023 Medical Rec #: [id number]      Accession #:    [id number] Date of Birth: [birth year]      Patient Gender: M Patient Age:   29 years Exam Location:  [facility] Procedure:      VAS [place] ABI WITH/WO TBI Referring Phys: --------------------------------------------------------------------------------  Indications: Peripheral artery disease. rt foot ulcers/non-healing wounds  Performing Technologist: Salvadore Farber RVT  Examination Guidelines: A complete evaluation includes at minimum, Doppler waveform signals and systolic blood pressure reading at the level of bilateral brachial, anterior tibial, and posterior tibial arteries, when vessel segments are accessible. Bilateral testing is considered an integral part of a complete examination. Photoelectric Plethysmograph (PPG) waveforms and toe systolic pressure readings are included as required and additional duplex testing as needed. Limited examinations for reoccurring indications may be performed as noted.  ABI Findings: +---------+------------------+-----+----------+--------+ Right    Rt Pressure (mmHg)IndexWaveform  Comment  +---------+------------------+-----+----------+--------+ Brachial 119                                       +---------+------------------+-----+----------+--------+ ATA      50                0.42  monophasic         +---------+------------------+-----+----------+--------+ PTA      147               1.23 monophasicNonComp  +---------+------------------+-----+----------+--------+ Great Toe69                0.57 Abnormal           +---------+------------------+-----+----------+--------+ +---------+------------------+-----+----------+-------+ Left     Lt Pressure (mmHg)IndexWaveform  Comment +---------+------------------+-----+----------+-------+ Brachial 120                                       +---------+------------------+-----+----------+-------+ ATA      118               0.98 monophasic        +---------+------------------+-----+----------+-------+ PTA      94                0.78 monophasic        +---------+------------------+-----+----------+-------+ Great Toe96                0.80 Abnormal          +---------+------------------+-----+----------+-------+  Summary: Right: Resting right ankle-brachial index indicates noncompressible right lower extremity arteries. The right toe-brachial index is abnormal. PPG tracings appear dampened. TBI suggests moderate disease. Left: Resting left ankle-brachial index is within normal range. The left toe-brachial index is normal. *See table(s) above for measurements and observations.  Electronically signed by Levora Dredge MD on 02/20/2023 at 11:21:11 AM.    Final        Assessment & Plan:   1. Atherosclerosis of native arteries of the extremities with ulceration (HCC)  Recommend:  The patient has evidence of severe atherosclerotic changes of both lower extremities associated with ulceration and tissue loss of the right foot.  This represents a limb threatening ischemia and places the patient at the risk for right limb loss.  Patient should undergo angiography of the right lower extremity with the hope for intervention for limb salvage.  The risks and benefits as well as the alternative therapies was discussed in detail with the patient.  All questions were answered.  Patient agrees to proceed with right angiography.  The patient will follow up with me in the office after the procedure.   2. Type 2 diabetes mellitus with retinopathy of both eyes, with long-term current use of insulin, macular edema presence unspecified, unspecified retinopathy severity (HCC) Continue hypoglycemic medications as already ordered, these medications have been reviewed and there are no changes at this time.  Hgb A1C  to be monitored as already arranged by primary service  3. Right foot ulcer, with fat layer exposed (HCC) Continue to follow-up with wound care for treatment.  4. Hypertension associated with diabetes (HCC) Continue antihypertensive medications as already ordered, these medications have been reviewed and there are no changes at this time.   Current Outpatient Medications on File Prior to Visit  Medication Sig Dispense Refill   Acetaminophen (TYLENOL PO) Take 3-4 tablets by mouth as needed (pain).     apixaban (ELIQUIS) 5 MG TABS tablet Take 1 tablet (5 mg total) by mouth 2 (two) times daily. 56 tablet 0   carvedilol (COREG) 12.5 MG tablet Take 1 tablet (12.5 mg total) by mouth 2 (two) times daily with a meal. 180 tablet 3   cholecalciferol (VITAMIN D3) 25 MCG (1000 UNIT) tablet Take 2,000 Units by mouth daily.  clopidogrel (PLAVIX) 75 MG tablet TAKE ONE TABLET BY MOUTH ONCE DAILY WITH BREAKFAST 90 tablet 3   Continuous Blood Gluc Sensor (FREESTYLE LIBRE 2 SENSOR) MISC APPLY SENSOR EVERY 14 DAYS TO MONITOR SUGAR CONTINOUSLY 2 each 11   Continuous Glucose Receiver (FREESTYLE LIBRE 2 READER) DEVI USE WITH SENSORS TO MONITOR SUGAR CONTINUOUSLY 1 each 0   Dulaglutide (TRULICITY) 4.5 MG/0.5ML SOPN Inject 4.5 mg into the skin once a week. Via [employer] PAP     empagliflozin (JARDIANCE) 10 MG TABS tablet Take 1 tablet (10 mg total) by mouth daily. 30 tablet 5   ezetimibe (ZETIA) 10 MG tablet TAKE ONE TABLET BY MOUTH ONCE DAILY 30 tablet 2   furosemide (LASIX) 40 MG tablet TAKE ONE TABLET BY MOUTH ONCE A DAY CAN TAKE A 2ND DAILY DOSE AS NEEDED. 180 tablet 1   HYDROcodone-acetaminophen (NORCO) 5-325 MG tablet Take 1-2 tablets by mouth daily as needed for moderate pain. 60 tablet 0   insulin aspart (NOVOLOG FLEXPEN) 100 UNIT/ML FlexPen 13 units in AM (scheduled) and 3 units PRN in evening (Patient taking differently: Inject 3-13 Units into the skin in the morning and at bedtime. Inject 13 units in  the morning and 3 units at night) 15 mL 6   sacubitril-valsartan (ENTRESTO) 24-26 MG Take 1 tablet by mouth 2 (two) times daily. 60 tablet 3   spironolactone (ALDACTONE) 25 MG tablet Take 1 tablet (25 mg total) by mouth daily. 30 tablet 5   terbinafine (LAMISIL) 250 MG tablet TAKE ONE TABLET (250 MG TOTAL) BY MOUTH DAILY. 30 tablet 0   TRESIBA FLEXTOUCH 100 UNIT/ML FlexTouch Pen INJECT 50 UNITS INTO THE SKIN DAILY 15 mL 2   triamcinolone cream (KENALOG) 0.5 % APPLY ONE APPLICATION TOPICALLY TWO (TWO) TIMES DAILY. 30 g 0   TRUEPLUS 5-BEVEL PEN NEEDLES 31G X 6 MM MISC USE TO INJECT INSULIN THREE TIMES A DAY 300 each 3   venlafaxine XR (EFFEXOR-XR) 150 MG 24 hr capsule TAKE 1 CAPSULE BY MOUTH DAILY WITH BREAKFAST. TAKE WITH EFFEXOR XR 75MG  FORA TOTAL OF 225MG  (Patient taking differently: Take 150 mg by mouth daily with breakfast. Take with 75 mg) 90 capsule 0   vitamin B-12 (CYANOCOBALAMIN) 1000 MCG tablet Take 1,000 mcg by mouth daily.     guaiFENesin-dextromethorphan (ROBITUSSIN DM) 100-10 MG/5ML syrup Take 5 mLs by mouth every 4 (four) hours as needed for cough. (Patient not taking: Reported on 03/08/2023) 118 mL 0   HYDROcodone-acetaminophen (NORCO) 5-325 MG tablet Take 1-2 tablets by mouth daily as needed for moderate pain. April (Patient not taking: Reported on 03/08/2023) 60 tablet 0   HYDROcodone-acetaminophen (NORCO/VICODIN) 5-325 MG tablet TAKE ONE (1) TO TWO (2) TABLETS BY MOUTH DAILY AS NEEDED FOR MODERATE PAIN. MAY (Patient not taking: Reported on 03/08/2023) 60 tablet 0   hydrOXYzine (ATARAX) 10 MG tablet TAKE 1 TABLET BY MOUTH 3 TIMES DAILY AS NEEDED FOR ANXIETY (Patient not taking: Reported on 03/08/2023) 30 tablet 2   isosorbide mononitrate (IMDUR) 30 MG 24 hr tablet Take 1 tablet (30 mg total) by mouth 2 (two) times daily. 60 tablet 2   ketoconazole (NIZORAL) 2 % cream Apply 1 Application topically daily. (Patient not taking: Reported on 02/24/2023) 15 g 0   nitroGLYCERIN (NITROSTAT) 0.4 MG  SL tablet DISSOLVE 1 TABLET UNDER TONGUE AS NEEDEDFOR CHEST PAIN. MAY REPEAT 5 MINUTES APART 3 TIMES IF NEEDED (Patient not taking: Reported on 02/24/2023) 25 tablet 3   No current facility-administered medications on file prior  to visit.    There are no Patient Instructions on file for this visit. No follow-ups on file.   Georgiana Spinner, NP

## 2023-03-13 ENCOUNTER — Other Ambulatory Visit: Payer: Self-pay | Admitting: Family Medicine

## 2023-03-13 ENCOUNTER — Other Ambulatory Visit (HOSPITAL_COMMUNITY): Payer: Self-pay

## 2023-03-13 DIAGNOSIS — E11621 Type 2 diabetes mellitus with foot ulcer: Secondary | ICD-10-CM | POA: Diagnosis not present

## 2023-03-14 ENCOUNTER — Other Ambulatory Visit: Payer: Self-pay | Admitting: Family

## 2023-03-15 ENCOUNTER — Other Ambulatory Visit (HOSPITAL_COMMUNITY): Payer: Self-pay

## 2023-03-15 NOTE — Progress Notes (Signed)
Paramedicine Encounter    Patient ID: [name], male    DOB: 06/12/1951, 72 y.o.   MRN: [id number]   Complaints-none   Edema-yes but better than last week   Compliance with meds-yes  Pill box filled-no If so, by whom-n/a  Refills needed-no  Pt reports he has been feeling ok.     I have his OOP expense report from pharm and I will take it to patient advocate to turn in for his eliquis app.  Last week his weight was up and he had some swelling to his legs.  His weight in the office was less than his home weight. His legs look a little better than last week.  Have not gotten the batteries for scales yet.  He denies increased sob, no c/p, no dizziness.   He went to vein and vascular last week, there is plan for intervention for that. He is waiting on phone call to hear back about the appointment. ' Pts b/p is quite low, but he has no symptoms.  Sent message to tina. If any changes he will be able to do them as he does  meds.   ----heard back from tina and she advised for him to hold his spiro tomor, since he already took it today--pt advised of this and understands plan  Will f/u next week.    BP (!) 80/0 Comment: systolic palpated  Pulse 71   Resp 16   SpO2 97%  B/p standing-80 systolic HR standing -70>>80 Weight yesterday--needs batteries  Last visit weight-252 @ office   Patient Care Team: [name], MD as PCP - General Mariah Milling Tollie Pizza, MD as PCP - Cardiology (Cardiology) Antonieta Iba, MD as Consulting Physician (Cardiology) Kathyrn Sheriff, [facility] (Inactive) as Pharmacist (Pharmacist)  Patient Active Problem List   Diagnosis Date Noted   SOB (shortness of breath) 03/03/2023   Atrial fibrillation (HCC) 02/22/2023   Alteration in mobility associated with pain 01/12/2023   Diabetic ulcer of ankle (HCC) 01/12/2023   Peripheral neuropathy 01/12/2023   NSTEMI (non-ST elevated myocardial infarction) (HCC) 11/05/2022   COVID-19 virus infection  11/05/2022   Penis disorder 05/16/2022   Other fatigue 05/16/2022   Rash 05/12/2022   GAD (generalized anxiety disorder) 09/21/2021   Encounter for power mobility device assessment 05/13/2021   Statin myopathy 03/30/2021   Chronic midline low back pain 11/12/2020   Tinea corporis 04/10/2018   ED (erectile dysfunction) 03/06/2018   Acute on chronic diastolic CHF (congestive heart failure) (HCC) 09/20/2016   Adjustment disorder with mixed anxiety and depressed mood 09/09/2016   Gastroesophageal reflux disease 09/09/2016   Elevated left ventricular end-diastolic pressure (LVEDP) 03/11/2016   Anemia 03/11/2016   Hypertensive heart disease with heart failure (HCC)    Diabetes mellitus type 2 with retinopathy (HCC)    Coronary artery disease of native artery of native heart with stable angina pectoris (HCC)    Morbid obesity (HCC)    Chronic chest pain    Diabetic retinopathy (HCC) 09/23/2014   Snoring 12/21/2012   HYPOGONADISM 09/28/2010   B12 deficiency 09/28/2010   Vitamin D deficiency 09/28/2010   OTHER MALAISE AND FATIGUE 09/17/2010   TRIGGER FINGER, RIGHT MIDDLE 09/14/2010   BRANCH RETINAL VEIN OCCLUSION 11/26/2009   CONSTIPATION 08/10/2009   GASTRITIS 07/29/2009   Major depressive disorder, recurrent episode, moderate (HCC) 07/06/2007   RENAL CALCULUS, HX OF 03/26/2007   Hyperlipidemia associated with type 2 diabetes mellitus (HCC) 12/05/2006   Hypertension associated with diabetes (HCC)  12/05/2006   Osteoarthritis 12/05/2006    Current Outpatient Medications:    Acetaminophen (TYLENOL PO), Take 3-4 tablets by mouth as needed (pain)., Disp: , Rfl:    apixaban (ELIQUIS) 5 MG TABS tablet, Take 1 tablet (5 mg total) by mouth 2 (two) times daily., Disp: 56 tablet, Rfl: 0   carvedilol (COREG) 12.5 MG tablet, Take 1 tablet (12.5 mg total) by mouth 2 (two) times daily with a meal., Disp: 180 tablet, Rfl: 3   cholecalciferol (VITAMIN D3) 25 MCG (1000 UNIT) tablet, Take 2,000 Units  by mouth daily., Disp: , Rfl:    clopidogrel (PLAVIX) 75 MG tablet, TAKE ONE TABLET BY MOUTH ONCE DAILY WITH BREAKFAST, Disp: 90 tablet, Rfl: 3   Continuous Blood Gluc Sensor (FREESTYLE LIBRE 2 SENSOR) MISC, APPLY SENSOR EVERY 14 DAYS TO MONITOR SUGAR CONTINOUSLY, Disp: 2 each, Rfl: 11   Continuous Glucose Receiver (FREESTYLE LIBRE 2 READER) DEVI, USE WITH SENSORS TO MONITOR SUGAR CONTINUOUSLY, Disp: 1 each, Rfl: 0   Dulaglutide (TRULICITY) 4.5 MG/0.5ML SOPN, Inject 4.5 mg into the skin once a week. Via [name] PAP, Disp: , Rfl:    empagliflozin (JARDIANCE) 10 MG TABS tablet, Take 1 tablet (10 mg total) by mouth daily., Disp: 30 tablet, Rfl: 5   ENTRESTO 24-26 MG, TAKE ONE TABLET BY MOUTH TWO (TWO) TIMES DAILY., Disp: 60 tablet, Rfl: 3   ezetimibe (ZETIA) 10 MG tablet, TAKE ONE TABLET BY MOUTH ONCE DAILY, Disp: 30 tablet, Rfl: 2   furosemide (LASIX) 40 MG tablet, TAKE ONE TABLET BY MOUTH ONCE A DAY CAN TAKE A 2ND DAILY DOSE AS NEEDED., Disp: 180 tablet, Rfl: 1   guaiFENesin-dextromethorphan (ROBITUSSIN DM) 100-10 MG/5ML syrup, Take 5 mLs by mouth every 4 (four) hours as needed for cough., Disp: 118 mL, Rfl: 0   HYDROcodone-acetaminophen (NORCO) 5-325 MG tablet, Take 1-2 tablets by mouth daily as needed for moderate pain., Disp: 60 tablet, Rfl: 0   HYDROcodone-acetaminophen (NORCO) 5-325 MG tablet, Take 1-2 tablets by mouth daily as needed for moderate pain. April, Disp: 60 tablet, Rfl: 0   HYDROcodone-acetaminophen (NORCO/VICODIN) 5-325 MG tablet, TAKE ONE (1) TO TWO (2) TABLETS BY MOUTH DAILY AS NEEDED FOR MODERATE PAIN. MAY, Disp: 60 tablet, Rfl: 0   hydrOXYzine (ATARAX) 10 MG tablet, TAKE 1 TABLET BY MOUTH 3 TIMES DAILY AS NEEDED FOR ANXIETY, Disp: 30 tablet, Rfl: 2   insulin aspart (NOVOLOG FLEXPEN) 100 UNIT/ML FlexPen, 13 units in AM (scheduled) and 3 units PRN in evening (Patient taking differently: Inject 3-13 Units into the skin in the morning and at bedtime. Inject 13 units in the morning  and 3 units at night), Disp: 15 mL, Rfl: 6   ketoconazole (NIZORAL) 2 % cream, Apply 1 Application topically daily., Disp: 15 g, Rfl: 0   nitroGLYCERIN (NITROSTAT) 0.4 MG SL tablet, DISSOLVE 1 TABLET UNDER TONGUE AS NEEDEDFOR CHEST PAIN. MAY REPEAT 5 MINUTES APART 3 TIMES IF NEEDED, Disp: 25 tablet, Rfl: 3   spironolactone (ALDACTONE) 25 MG tablet, Take 1 tablet (25 mg total) by mouth daily., Disp: 30 tablet, Rfl: 5   terbinafine (LAMISIL) 250 MG tablet, TAKE ONE TABLET (250 MG TOTAL) BY MOUTH DAILY., Disp: 30 tablet, Rfl: 0   TRESIBA FLEXTOUCH 100 UNIT/ML FlexTouch Pen, INJECT 50 UNITS INTO THE SKIN DAILY, Disp: 15 mL, Rfl: 2   triamcinolone cream (KENALOG) 0.5 %, APPLY ONE APPLICATION TOPICALLY TWO (TWO) TIMES DAILY., Disp: 30 g, Rfl: 0   TRUEPLUS 5-BEVEL PEN NEEDLES 31G X 6 MM MISC,  USE TO INJECT INSULIN THREE TIMES A DAY, Disp: 300 each, Rfl: 3   venlafaxine XR (EFFEXOR-XR) 150 MG 24 hr capsule, TAKE 1 CAPSULE BY MOUTH DAILY WITH BREAKFAST. TAKE WITH EFFEXOR XR 75MG  FORA TOTAL OF 225MG  (Patient taking differently: Take 150 mg by mouth daily with breakfast. Take with 75 mg), Disp: 90 capsule, Rfl: 0   vitamin B-12 (CYANOCOBALAMIN) 1000 MCG tablet, Take 1,000 mcg by mouth daily., Disp: , Rfl:    isosorbide mononitrate (IMDUR) 30 MG 24 hr tablet, Take 1 tablet (30 mg total) by mouth 2 (two) times daily., Disp: 60 tablet, Rfl: 2 Allergies  Allergen Reactions   Bupropion Nausea Only   Ozempic (0.25 Or 0.5 Mg-Dose) [Semaglutide(0.25 Or 0.5mg -Dos)] Nausea And Vomiting   Atorvastatin Other (See Comments)    Body aches Similar effect with rosuvastatin 40 mg twice weekly      Social History   Socioeconomic History   Marital status: Widowed    Spouse name: Not on file   Number of children: 3   Years of education: Not on file   Highest education level: 7th grade  Occupational History   Occupation: disabled    Associate Professor: UNEMPLOYED    Comment: back injury  Tobacco Use   Smoking status: Never    Smokeless tobacco: Never  Vaping Use   Vaping status: Never Used  Substance and Sexual Activity   Alcohol use: No    Alcohol/week: 0.0 standard drinks of alcohol   Drug use: No   Sexual activity: Not Currently  Other Topics Concern   Not on file  Social History Narrative   Has a roommate, Mr. [name]. No pets.   Social Determinants of Health   Financial Resource Strain: Low Risk  (11/23/2022)   Overall Financial Resource Strain (CARDIA)    Difficulty of Paying Living Expenses: Not hard at all  Food Insecurity: No Food Insecurity (12/29/2022)   Hunger Vital Sign    Worried About Running Out of Food in the Last Year: Never true    Ran Out of Food in the Last Year: Never true  Transportation Needs: No Transportation Needs (12/29/2022)   PRAPARE - Administrator, Civil Service (Medical): No    Lack of Transportation (Non-Medical): No  Physical Activity: Inactive (11/09/2022)   Exercise Vital Sign    Days of Exercise per Week: 0 days    Minutes of Exercise per Session: 0 min  Stress: No Stress Concern Present (11/09/2022)   [employer] of Occupational Health - Occupational Stress Questionnaire    Feeling of Stress : Only a little  Social Connections: Moderately Isolated (11/09/2022)   Social Connection and Isolation Panel [NHANES]    Frequency of Communication with Friends and Family: More than three times a week    Frequency of Social Gatherings with Friends and Family: More than three times a week    Attends Religious Services: More than 4 times per year    Active Member of [employer] or Organizations: No    Attends Banker Meetings: Never    Marital Status: Widowed  Intimate Partner Violence: Not At Risk (11/09/2022)   Humiliation, Afraid, Rape, and Kick questionnaire    Fear of Current or Ex-Partner: No    Emotionally Abused: No    Physically Abused: No    Sexually Abused: No    Physical Exam      Future Appointments  Date Time Provider  Department Center  03/16/2023 10:15 AM Dalbert Mayotte III, PA-C [facility]  None  04/03/2023  3:00 PM Delma Freeze, FNP [facility] None  04/05/2023  1:55 PM Charlsie Quest, NP [facility] None  05/18/2023  8:00 AM [facility] LAB [facility]  05/25/2023  2:40 PM [name], MD [facility]  11/14/2023 10:45 AM [facility] ANNUAL WELLNESS VISIT 1 [facility]       Kerry Hough, Paramedic [phone] [facility] Paramedic  03/15/23

## 2023-03-16 ENCOUNTER — Telehealth (INDEPENDENT_AMBULATORY_CARE_PROVIDER_SITE_OTHER): Payer: Self-pay

## 2023-03-16 ENCOUNTER — Encounter: Payer: PPO | Admitting: Physician Assistant

## 2023-03-16 ENCOUNTER — Telehealth (HOSPITAL_COMMUNITY): Payer: Self-pay

## 2023-03-16 DIAGNOSIS — L97412 Non-pressure chronic ulcer of right heel and midfoot with fat layer exposed: Secondary | ICD-10-CM | POA: Diagnosis not present

## 2023-03-16 DIAGNOSIS — L97512 Non-pressure chronic ulcer of other part of right foot with fat layer exposed: Secondary | ICD-10-CM | POA: Diagnosis not present

## 2023-03-16 DIAGNOSIS — E11621 Type 2 diabetes mellitus with foot ulcer: Secondary | ICD-10-CM | POA: Diagnosis not present

## 2023-03-16 NOTE — Progress Notes (Addendum)
[name], [name] ([id number]) [id number] Page 1 of 7 Visit Report for 03/16/2023 Chief Complaint Document Details Patient Name: Date of Service: [name] [facility]. 03/16/2023 10:15 A M Medical Record Number: [id number] Patient Account Number: [id number] Date of Birth/Sex: Treating RN: 1951-06-07 (72 y.o. [name] Primary Care Provider: Kerby Nora Other Clinician: Referring Provider: Treating Provider/Extender: [name], [name] in Treatment: 6 Information Obtained from: Patient Chief Complaint Right foot ulcers Electronic Signature(s) Signed: 03/16/2023 10:20:47 AM By: Allen Derry PA-C Entered By: Allen Derry on 03/16/2023 10:20:47 -------------------------------------------------------------------------------- HPI Details Patient Name: Date of Service: [name] [facility]. 03/16/2023 10:15 A M Medical Record Number: [id number] Patient Account Number: [id number] Date of Birth/Sex: Treating RN: Jun 05, 1951 (72 y.o. [name] Primary Care Provider: Kerby Nora Other Clinician: Referring Provider: Treating Provider/Extender: [name], [name] in Treatment: 6 History of Present Illness HPI Description: 01-31-2023 upon evaluation today patient appears to be doing poorly currently in regard to wounds on his right heel, right dorsal foot, and right lateral foot. Subsequently he does have known peripheral vascular disease and again this is something that he needs to I think Checked formally. This is what the majority of the conversation today hinged around and the patient voiced understanding as far as that is concerned. Fortunately I do not see any signs of active infection locally nor systemically which is great news. Patient does have a history of diabetes mellitus type 2, atrial fibrillation for which she is on long-term anticoagulant Therapy, hypertension, and coronary artery disease. Patient's hemoglobin A1c most recently was on  11-17-2022 and was 7.2 and currently he is on Eliquis and Plavix. 02-07-2023 upon evaluation today patient appears to be doing well currently in regard to his wounds all things considered I feel like we are still maintaining that does not seem like it is any worse is also not significantly better. I discussed with the patient that I do believe he would benefit from the arterial evaluation we still need to get this done as quickly as possible and subsequently we did put in a follow-up call with the vascular office today with regard to this. 02-14-2023 upon evaluation today patient appears to be doing a little better in regard to his wounds which are showing signs of loosening which is great news. With that being said he is experiencing an improvement overall in his symptoms and we are still waiting on the vascular evaluation want to get this result and consider whether or not his blood flow is good or not we will be able to make a better determination of next steps. For now we will try and avoid any aggressive sharp debridement to know that he has good arterial flow. [name], [name] ([id number]) [id number] Page 2 of 7 02-21-2023 upon evaluation today patient appears to be doing about the same in regard to his wound. Fortunately there does not appear to be any signs of active infection at this time which is great news. No fevers, chills, nausea, vomiting, or diarrhea. I did review patient's arterial study and it appears that he has pretty good flow in the left is not perfect but it is decent. On the right however he is definitely not doing nearly as good and I think that he is going to need to see one of the vascular doctors for further evaluation and treatment of this right leg in order to get these wounds to heal. 02-28-2023 upon evaluation today patient appears to be doing  well currently in regard to his wound. He has been tolerating the dressing changes without complication.  Fortunately I do not see any evidence of active infection locally nor systemically at this time. I do think that the eschar started to soften up and I would like to try to get some of this off to see if we can get things moving in the right direction. He is in agreement with this plan. 03-09-2023 upon evaluation today patient appears to be doing well currently in regard to his wound. He has been tolerating the dressing changes without complication. Fortunately there does not appear to be any signs of active infection locally nor systemically at this time which is great news. I do believe clearing out some of the necrotic debris last week has helped to a degree. 03-16-2023 upon evaluation today patient appears to be doing well currently in regard to his wounds. He is going to be having a vascular angiogram in order to open up blood flow and I think this is going to be very beneficial for him. Fortunately I do not see any evidence of active infection locally nor systemically which is great news. Electronic Signature(s) Signed: 03/16/2023 12:41:38 PM By: Allen Derry PA-C Entered By: Allen Derry on 03/16/2023 12:41:37 -------------------------------------------------------------------------------- Physical Exam Details Patient Name: Date of Service: [name] [facility] H. 03/16/2023 10:15 A M Medical Record Number: [id number] Patient Account Number: [id number] Date of Birth/Sex: Treating RN: 1951/05/30 (72 y.o. [name] Primary Care Provider: Kerby Nora Other Clinician: Referring Provider: Treating Provider/Extender: [name], [name] in Treatment: 6 Constitutional Well-nourished and well-hydrated in no acute distress. Respiratory normal breathing without difficulty. Psychiatric this patient is able to make decisions and demonstrates good insight into disease process. Alert and Oriented x 3. pleasant and cooperative. Notes Patient's wound still has some necrotic debris. With that  being said I am avoiding any aggressive sharp debridement at this point due to the fact that he does not have good arterial flow and he is aware of this as well. With that being said I did discuss with the patient that he should continue to use the Iodoflex I think that still the best way to go and soon as he gets arterial flow going I think we can do a more appropriate debridement to clear things away. Electronic Signature(s) Signed: 03/16/2023 12:42:03 PM By: Allen Derry PA-C Entered By: Allen Derry on 03/16/2023 12:42:03 [name] ([id number]) [id number] Page 3 of 7 -------------------------------------------------------------------------------- Physician Orders Details Patient Name: Date of Service: [name] [facility] H. 03/16/2023 10:15 A M Medical Record Number: [id number] Patient Account Number: [id number] Date of Birth/Sex: Treating RN: [birth year] (72 y.o. [name] Primary Care Provider: Kerby Nora Other Clinician: Referring Provider: Treating Provider/Extender: [name], [name] in Treatment: 6 Verbal / Phone Orders: No Diagnosis Coding ICD-10 Coding Code Description E11.621 Type 2 diabetes mellitus with foot ulcer L97.512 Non-pressure chronic ulcer of other part of right foot with fat layer exposed I48.0 Paroxysmal atrial fibrillation Z79.01 Long term (current) use of anticoagulants I10 Essential (primary) hypertension I25.10 Atherosclerotic heart disease of native coronary artery without angina pectoris Follow-up Appointments Return Appointment in 1 week. Bathing/ [employer] wounds with antibacterial soap and water. Anesthetic (Use 'Patient Medications' Section for Anesthetic Order Entry) Lidocaine applied to wound bed Wound Treatment Wound #1 - Calcaneus Wound Laterality: Right Cleanser: Byram Ancillary Kit - 15 Day Supply (Generic) 3 x Per Week/30 Days Discharge Instructions: Use supplies as instructed;  Kit  contains: (15) Saline Bullets; (15) 3x3 Gauze; 15 pr Gloves Cleanser: Soap and Water 3 x Per Week/30 Days Discharge Instructions: Gently cleanse wound with antibacterial soap, rinse and pat dry prior to dressing wounds Prim Dressing: IODOFLEX 0.9% Cadexomer Iodine Pad (Generic) 3 x Per Week/30 Days ary Discharge Instructions: Apply Iodoflex to wound bed only as directed. Secondary Dressing: (BORDER) Zetuvit Plus SILICONE BORDER Dressing 4x4 (in/in) (Generic) 3 x Per Week/30 Days Discharge Instructions: Please do not put silicone bordered dressings under wraps. Use non-bordered dressing only. Wound #2 - Foot Wound Laterality: Dorsal, Right, Distal Cleanser: Byram Ancillary Kit - 15 Day Supply (Generic) 3 x Per Week/30 Days Discharge Instructions: Use supplies as instructed; Kit contains: (15) Saline Bullets; (15) 3x3 Gauze; 15 pr Gloves Cleanser: Soap and Water 3 x Per Week/30 Days Discharge Instructions: Gently cleanse wound with antibacterial soap, rinse and pat dry prior to dressing wounds Prim Dressing: IODOFLEX 0.9% Cadexomer Iodine Pad (Generic) 3 x Per Week/30 Days ary Discharge Instructions: Apply Iodoflex to wound bed only as directed. Secondary Dressing: (BORDER) Zetuvit Plus SILICONE BORDER Dressing 4x4 (in/in) (Generic) 3 x Per Week/30 Days Discharge Instructions: Please do not put silicone bordered dressings under wraps. Use non-bordered dressing only. Wound #3 - Foot Wound Laterality: Dorsal, Right, Proximal Cleanser: Soap and Water 3 x Per Week/30 Days Discharge Instructions: Gently cleanse wound with antibacterial soap, rinse and pat dry prior to dressing wounds Prim Dressing: IODOFLEX 0.9% Cadexomer Iodine Pad (Generic) 3 x Per Week/30 Days ary Discharge Instructions: Apply Iodoflex to wound bed only as directed. Secondary Dressing: (BORDER) Zetuvit Plus SILICONE BORDER Dressing 4x4 (in/in) (Generic) 3 x Per Week/30 Days Discharge Instructions: Please do not put silicone  bordered dressings under wraps. Use non-bordered dressing only. Electronic Signature(s) Signed: 03/16/2023 4:23:38 PM By: Midge Aver MSN RN CNS WTA Signed: 03/27/2023 3:00:23 PM By: Darlyn Chamber, Biggs ([id number]) [id number] Page 4 of 7 Entered By: Midge Aver on 03/16/2023 11:44:24 -------------------------------------------------------------------------------- Problem List Details Patient Name: Date of Service: [name] [facility] H. 03/16/2023 10:15 A M Medical Record Number: [id number] Patient Account Number: [id number] Date of Birth/Sex: Treating RN: 1950-11-15 (72 y.o. [name] Primary Care Provider: Kerby Nora Other Clinician: Referring Provider: Treating Provider/Extender: [name], [name] in Treatment: 6 Active Problems ICD-10 Encounter Code Description Active Date MDM Diagnosis E11.621 Type 2 diabetes mellitus with foot ulcer 01/31/2023 No Yes L97.512 Non-pressure chronic ulcer of other part of right foot with fat layer exposed 01/31/2023 No Yes I48.0 Paroxysmal atrial fibrillation 01/31/2023 No Yes Z79.01 Long term (current) use of anticoagulants 01/31/2023 No Yes I10 Essential (primary) hypertension 01/31/2023 No Yes I25.10 Atherosclerotic heart disease of native coronary artery without angina pectoris 01/31/2023 No Yes Inactive Problems Resolved Problems Electronic Signature(s) Signed: 03/16/2023 4:23:38 PM By: Midge Aver MSN RN CNS WTA Signed: 03/27/2023 3:00:23 PM By: Allen Derry PA-C Previous Signature: 03/16/2023 10:20:44 AM Version By: Allen Derry PA-C Entered By: Midge Aver on 03/16/2023 11:46:13 Giglia, [name] ([id number]) [id number] Page 5 of 7 -------------------------------------------------------------------------------- Progress Note Details Patient Name: Date of Service: [name] [facility] H. 03/16/2023 10:15 A M Medical Record Number: [id number] Patient Account Number:  [id number] Date of Birth/Sex: Treating RN: 14-Jun-1951 (72 y.o. [name] Primary Care Provider: Kerby Nora Other Clinician: Referring Provider: Treating Provider/Extender: [name], [name] in Treatment: 6 Subjective Chief Complaint Information obtained from Patient Right foot ulcers History of Present Illness (HPI) 01-31-2023 upon evaluation today patient appears  to be doing poorly currently in regard to wounds on his right heel, right dorsal foot, and right lateral foot. Subsequently he does have known peripheral vascular disease and again this is something that he needs to I think Checked formally. This is what the majority of the conversation today hinged around and the patient voiced understanding as far as that is concerned. Fortunately I do not see any signs of active infection locally nor systemically which is great news. Patient does have a history of diabetes mellitus type 2, atrial fibrillation for which she is on long-term anticoagulant Therapy, hypertension, and coronary artery disease. Patient's hemoglobin A1c most recently was on 11-17-2022 and was 7.2 and currently he is on Eliquis and Plavix. 02-07-2023 upon evaluation today patient appears to be doing well currently in regard to his wounds all things considered I feel like we are still maintaining that does not seem like it is any worse is also not significantly better. I discussed with the patient that I do believe he would benefit from the arterial evaluation we still need to get this done as quickly as possible and subsequently we did put in a follow-up call with the vascular office today with regard to this. 02-14-2023 upon evaluation today patient appears to be doing a little better in regard to his wounds which are showing signs of loosening which is great news. With that being said he is experiencing an improvement overall in his symptoms and we are still waiting on the vascular evaluation want to get  this result and consider whether or not his blood flow is good or not we will be able to make a better determination of next steps. For now we will try and avoid any aggressive sharp debridement to know that he has good arterial flow. 02-21-2023 upon evaluation today patient appears to be doing about the same in regard to his wound. Fortunately there does not appear to be any signs of active infection at this time which is great news. No fevers, chills, nausea, vomiting, or diarrhea. I did review patient's arterial study and it appears that he has pretty good flow in the left is not perfect but it is decent. On the right however he is definitely not doing nearly as good and I think that he is going to need to see one of the vascular doctors for further evaluation and treatment of this right leg in order to get these wounds to heal. 02-28-2023 upon evaluation today patient appears to be doing well currently in regard to his wound. He has been tolerating the dressing changes without complication. Fortunately I do not see any evidence of active infection locally nor systemically at this time. I do think that the eschar started to soften up and I would like to try to get some of this off to see if we can get things moving in the right direction. He is in agreement with this plan. 03-09-2023 upon evaluation today patient appears to be doing well currently in regard to his wound. He has been tolerating the dressing changes without complication. Fortunately there does not appear to be any signs of active infection locally nor systemically at this time which is great news. I do believe clearing out some of the necrotic debris last week has helped to a degree. 03-16-2023 upon evaluation today patient appears to be doing well currently in regard to his wounds. He is going to be having a vascular angiogram in order to open up blood flow and I  think this is going to be very beneficial for him. Fortunately I do not see  any evidence of active infection locally nor systemically which is great news. Objective Constitutional Well-nourished and well-hydrated in no acute distress. Vitals Time Taken: 11:13 AM, Height: 66 in, Weight: 250 lbs, BMI: 40.3, Temperature: 97.7 F, Pulse: 73 bpm, Respiratory Rate: 18 breaths/min, Blood Pressure: 131/63 mmHg. Respiratory normal breathing without difficulty. Psychiatric [name], [name] ([id number]) [id number] Page 6 of 7 this patient is able to make decisions and demonstrates good insight into disease process. Alert and Oriented x 3. pleasant and cooperative. General Notes: Patient's wound still has some necrotic debris. With that being said I am avoiding any aggressive sharp debridement at this point due to the fact that he does not have good arterial flow and he is aware of this as well. With that being said I did discuss with the patient that he should continue to use the Iodoflex I think that still the best way to go and soon as he gets arterial flow going I think we can do a more appropriate debridement to clear things away. Integumentary (Hair, Skin) Wound #1 status is Open. Original cause of wound was Gradually Appeared. The date acquired was: 12/10/2022. The wound has been in treatment 6 weeks. The wound is located on the Right Calcaneus. The wound measures 2.8cm length x 1.2cm width x 0.2cm depth; 2.639cm^2 area and 0.528cm^3 volume. There is Fat Layer (Subcutaneous Tissue) exposed. There is a medium amount of serosanguineous drainage noted. There is medium (34-66%) red granulation within the wound bed. There is no necrotic tissue within the wound bed. Wound #2 status is Open. Original cause of wound was Gradually Appeared. The date acquired was: 12/10/2022. The wound has been in treatment 6 weeks. The wound is located on the Right,Distal,Dorsal Foot. The wound measures 0.8cm length x 1cm width x 0.2cm depth; 0.628cm^2 area and 0.126cm^3  volume. There is Fat Layer (Subcutaneous Tissue) exposed. There is a medium amount of serosanguineous drainage noted. There is no granulation within the wound bed. There is no necrotic tissue within the wound bed. Wound #3 status is Open. Original cause of wound was Gradually Appeared. The date acquired was: 12/10/2022. The wound has been in treatment 6 weeks. The wound is located on the Right,Proximal,Dorsal Foot. The wound measures 1.8cm length x 2.2cm width x 0.3cm depth; 3.11cm^2 area and 0.933cm^3 volume. There is Fat Layer (Subcutaneous Tissue) exposed. There is a medium amount of serosanguineous drainage noted. There is no granulation within the wound bed. There is a large (67-100%) amount of necrotic tissue within the wound bed including Adherent Slough. Assessment Active Problems ICD-10 Type 2 diabetes mellitus with foot ulcer Non-pressure chronic ulcer of other part of right foot with fat layer exposed Paroxysmal atrial fibrillation Long term (current) use of anticoagulants Essential (primary) hypertension Atherosclerotic heart disease of native coronary artery without angina pectoris Plan Follow-up Appointments: Return Appointment in 1 week. Bathing/ Shower/ Hygiene: Wash wounds with antibacterial soap and water. Anesthetic (Use 'Patient Medications' Section for Anesthetic Order Entry): Lidocaine applied to wound bed WOUND #1: - Calcaneus Wound Laterality: Right Cleanser: Byram Ancillary Kit - 15 Day Supply (Generic) 3 x Per Week/30 Days Discharge Instructions: Use supplies as instructed; Kit contains: (15) Saline Bullets; (15) 3x3 Gauze; 15 pr Gloves Cleanser: Soap and Water 3 x Per Week/30 Days Discharge Instructions: Gently cleanse wound with antibacterial soap, rinse and pat dry prior to dressing wounds Prim Dressing: IODOFLEX 0.9% Cadexomer Iodine Pad (Generic)  3 x Per Week/30 Days ary Discharge Instructions: Apply Iodoflex to wound bed only as directed. Secondary  Dressing: (BORDER) Zetuvit Plus SILICONE BORDER Dressing 4x4 (in/in) (Generic) 3 x Per Week/30 Days Discharge Instructions: Please do not put silicone bordered dressings under wraps. Use non-bordered dressing only. WOUND #2: - Foot Wound Laterality: Dorsal, Right, Distal Cleanser: Byram Ancillary Kit - 15 Day Supply (Generic) 3 x Per Week/30 Days Discharge Instructions: Use supplies as instructed; Kit contains: (15) Saline Bullets; (15) 3x3 Gauze; 15 pr Gloves Cleanser: Soap and Water 3 x Per Week/30 Days Discharge Instructions: Gently cleanse wound with antibacterial soap, rinse and pat dry prior to dressing wounds Prim Dressing: IODOFLEX 0.9% Cadexomer Iodine Pad (Generic) 3 x Per Week/30 Days ary Discharge Instructions: Apply Iodoflex to wound bed only as directed. Secondary Dressing: (BORDER) Zetuvit Plus SILICONE BORDER Dressing 4x4 (in/in) (Generic) 3 x Per Week/30 Days Discharge Instructions: Please do not put silicone bordered dressings under wraps. Use non-bordered dressing only. WOUND #3: - Foot Wound Laterality: Dorsal, Right, Proximal Cleanser: Soap and Water 3 x Per Week/30 Days Discharge Instructions: Gently cleanse wound with antibacterial soap, rinse and pat dry prior to dressing wounds Prim Dressing: IODOFLEX 0.9% Cadexomer Iodine Pad (Generic) 3 x Per Week/30 Days ary Discharge Instructions: Apply Iodoflex to wound bed only as directed. Secondary Dressing: (BORDER) Zetuvit Plus SILICONE BORDER Dressing 4x4 (in/in) (Generic) 3 x Per Week/30 Days Discharge Instructions: Please do not put silicone bordered dressings under wraps. Use non-bordered dressing only. 1. Would recommend currently we continue with Iodoflex which I think is still doing a good job here. 2. I am also can recommend patient should continue to monitor for any signs of infection or worsening. Basically if he is doing well with Iodoflex we will stick with that once he gets the good arterial flow we will be able  to perform a more appropriate debridement cleaning this off and allow this to heal much more effectively and quickly. We will see patient back for reevaluation in 1 week here in the clinic. If anything worsens or changes patient will contact our office for additional recommendations. [name], [name] ([id number]) [id number] Page 7 of 7 Electronic Signature(s) Signed: 03/16/2023 12:42:29 PM By: Allen Derry PA-C Entered By: Allen Derry on 03/16/2023 12:42:29 -------------------------------------------------------------------------------- SuperBill Details Patient Name: Date of Service: [name] [facility] H. 03/16/2023 Medical Record Number: [id number] Patient Account Number: [id number] Date of Birth/Sex: Treating RN: [birth year] (72 y.o. [name] Primary Care Provider: Kerby Nora Other Clinician: Referring Provider: Treating Provider/Extender: [name], [name] in Treatment: 6 Diagnosis Coding ICD-10 Codes Code Description E11.621 Type 2 diabetes mellitus with foot ulcer L97.512 Non-pressure chronic ulcer of other part of right foot with fat layer exposed I48.0 Paroxysmal atrial fibrillation Z79.01 Long term (current) use of anticoagulants I10 Essential (primary) hypertension I25.10 Atherosclerotic heart disease of native coronary artery without angina pectoris Facility Procedures : CPT4 Code: [id number] Description: 99214 - WOUND CARE VISIT-LEV 4 EST PT Modifier: Quantity: 1 Physician Procedures : CPT4 Code Description Modifier [id number] - WC PHYS LEVEL 3 - EST PT ICD-10 Diagnosis Description E11.621 Type 2 diabetes mellitus with foot ulcer L97.512 Non-pressure chronic ulcer of other part of right foot with fat layer exposed I48.0 Paroxysmal  atrial fibrillation Z79.01 Long term (current) use of anticoagulants Quantity: 1 Electronic Signature(s) Signed: 03/16/2023 12:42:58 PM By: Allen Derry PA-C Entered By: Allen Derry on 03/16/2023  12:42:57

## 2023-03-16 NOTE — Telephone Encounter (Signed)
Advanced Heart Failure Patient Advocate Encounter  Patient inquired about Eliquis assistance. Discussed 3% criteria for application. Patient had previously started application but it was not submitted. Pt consented for me to submit application on his behalf, and pt provided expense report to paramed to be included.  Application for Eliquis faxed to [employer] along with OOP on 03/16/23.  Burnell Blanks, CPhT Rx Patient Advocate Phone: [phone]

## 2023-03-16 NOTE — Telephone Encounter (Signed)
Spoke with the patient's daughter and the patient is scheduled with Dr. Wyn Quaker on 03/27/23 for a RLE angio with a 10:00 am arrival time to the [facility] Pre-procedure instructions were discussed and will be sent to Mychart and mailed.

## 2023-03-16 NOTE — Progress Notes (Addendum)
CASTLE, LAMONS ([id number]) [id number] Page 1 of 12 Visit Report for 03/16/2023 Arrival Information Details Patient Name: Date of Service: [name] [facility] H. 03/16/2023 10:15 A M Medical Record Number: [id number] Patient Account Number: [id number] Date of Birth/Sex: Treating RN: 11/21/1950 (72 y.o. [name] Primary Care [name]: [name] Other Clinician: Referring [name]: Treating [name]/Extender: [name], [name] in Treatment: 6 Visit Information History Since Last Visit Added or deleted any medications: No Patient Arrived: Cane Any new allergies or adverse reactions: No Arrival Time: 11:10 Has Dressing in Place as Prescribed: Yes Accompanied By: self Pain Present Now: No Transfer Assistance: None Patient Identification Verified: Yes Secondary Verification Process Completed: Yes Patient Requires Transmission-Based Precautions: No Patient Has Alerts: Yes Patient Alerts: Patient on Blood Thinner Diabetes type 2 Eliquis/Plavix ABI R 1.23 TBI 0.57 ABI L 0.98 TBI 0.80 Electronic Signature(s) Signed: 03/16/2023 4:23:38 PM By: William Aver MSN RN CNS WTA Entered By: [name] on 03/16/2023 11:13:42 -------------------------------------------------------------------------------- Clinic Level of Care Assessment Details Patient Name: Date of Service: [name] [facility] H. 03/16/2023 10:15 A M Medical Record Number: [id number] Patient Account Number: [id number] Date of Birth/Sex: Treating RN: 1951/08/16 (72 y.o. [name] Primary Care [name]: [name] Other Clinician: Referring [name]: Treating [name]/Extender: [name], [name] in Treatment: 6 Clinic Level of Care Assessment Items TOOL 4 Quantity Score X- 1 0 Use when only an EandM is performed on FOLLOW-UP visit ASSESSMENTS - Nursing Assessment / Reassessment X- 1 10 Reassessment of Co-morbidities (includes updates in patient status) X- 1  5 Reassessment of Adherence to Treatment Plan [name] ([id number]) [id number] Page 2 of 12 ASSESSMENTS - Wound and Skin A ssessment / Reassessment []  - Simple Wound Assessment / Reassessment - one wound 0 X- 3 5 Complex Wound Assessment / Reassessment - multiple wounds []  - 0 Dermatologic / Skin Assessment (not related to wound area) ASSESSMENTS - Focused Assessment []  - 0 Circumferential Edema Measurements - multi extremities []  - 0 Nutritional Assessment / Counseling / Intervention []  - 0 Lower Extremity Assessment (monofilament, tuning fork, pulses) []  - 0 Peripheral Arterial Disease Assessment (using hand held doppler) ASSESSMENTS - Ostomy and/or Continence Assessment and Care []  - 0 Incontinence Assessment and Management []  - 0 Ostomy Care Assessment and Management (repouching, etc.) PROCESS - Coordination of Care []  - 0 Simple Patient / Family Education for ongoing care X- 1 20 Complex (extensive) Patient / Family Education for ongoing care X- 1 10 Staff obtains Chiropractor, Records, T Results / Process Orders est []  - 0 Staff telephones HHA, Nursing Homes / Clarify orders / etc []  - 0 Routine Transfer to another Facility (non-emergent condition) []  - 0 Routine Hospital Admission (non-emergent condition) []  - 0 New Admissions / Manufacturing engineer / Ordering NPWT Apligraf, etc. , []  - 0 Emergency Hospital Admission (emergent condition) []  - 0 Simple Discharge Coordination X- 1 15 Complex (extensive) Discharge Coordination PROCESS - Special Needs []  - 0 Pediatric / Minor Patient Management []  - 0 Isolation Patient Management []  - 0 Hearing / Language / Visual special needs []  - 0 Assessment of Community assistance (transportation, D/C planning, etc.) []  - 0 Additional assistance / Altered mentation []  - 0 Support Surface(s) Assessment (bed, cushion, seat, etc.) INTERVENTIONS - Wound Cleansing / Measurement []  -  0 Simple Wound Cleansing - one wound X- 3 5 Complex Wound Cleansing - multiple wounds X- 1 5 Wound Imaging (photographs - any number of wounds) []  - 0  Wound Tracing (instead of photographs) []  - 0 Simple Wound Measurement - one wound X- 3 5 Complex Wound Measurement - multiple wounds INTERVENTIONS - Wound Dressings X - Small Wound Dressing one or multiple wounds 3 10 []  - 0 Medium Wound Dressing one or multiple wounds []  - 0 Large Wound Dressing one or multiple wounds []  - 0 Application of Medications - topical []  - 0 Application of Medications - injection INTERVENTIONS - Miscellaneous []  - 0 External ear exam [name] ([id number]) [phone].pdf Page 3 of 12 []  - 0 Specimen Collection (cultures, biopsies, blood, body fluids, etc.) []  - 0 Specimen(s) / Culture(s) sent or taken to Lab for analysis []  - 0 Patient Transfer (multiple staff / Michiel Sites Lift / Similar devices) []  - 0 Simple Staple / Suture removal (25 or less) []  - 0 Complex Staple / Suture removal (26 or more) []  - 0 Hypo / Hyperglycemic Management (close monitor of Blood Glucose) []  - 0 Ankle / Brachial Index (ABI) - do not check if billed separately X- 1 5 Vital Signs Has the patient been seen at the hospital within the last three years: Yes Total Score: 145 Level Of Care: New/Established - Level 4 Electronic Signature(s) Signed: 03/16/2023 4:23:38 PM By: William Aver MSN RN CNS WTA Entered By: [name] on 03/16/2023 11:45:20 -------------------------------------------------------------------------------- Encounter Discharge Information Details Patient Name: Date of Service: [name] HN H. 03/16/2023 10:15 A M Medical Record Number: [id number] Patient Account Number: [id number] Date of Birth/Sex: Treating RN: Oct 05, 1950 (72 y.o. [name] Primary Care [name]: [name] Other Clinician: Referring [name]: Treating [name]/Extender: [name],  [name] in Treatment: 6 Encounter Discharge Information Items Discharge Condition: Stable Ambulatory Status: Cane Discharge Destination: Home Transportation: Private Auto Accompanied By: self Schedule Follow-up Appointment: Yes Clinical Summary of Care: Electronic Signature(s) Signed: 03/16/2023 4:23:38 PM By: William Aver MSN RN CNS WTA Entered By: [name] on 03/16/2023 11:46:50 -------------------------------------------------------------------------------- Lower Extremity Assessment Details Patient Name: Date of Service: [name] HN H. 03/16/2023 10:15 A JERZY, ROEPKE ([id number]) [id number] Page 4 of 12 Medical Record Number: [id number] Patient Account Number: [id number] Date of Birth/Sex: Treating RN: Aug 17, 1951 (72 y.o. [name] Primary Care Azariah Bonura: [name] Other Clinician: Referring Julies Carmickle: Treating Earnestine Shipp/Extender: [name], [name] in Treatment: 6 Edema Assessment Assessed: [Left: No] [Right: No] [Left: Edema] [Right: :] Calf Left: Right: Point of Measurement: 36 cm From Medial Instep 40 cm Ankle Left: Right: Point of Measurement: 18 cm From Medial Instep 24 cm Knee To Floor Left: Right: From Medial Instep 40 cm Vascular Assessment Pulses: Dorsalis Pedis Palpable: [Right:Yes] Electronic Signature(s) Signed: 03/16/2023 4:23:38 PM By: William Aver MSN RN CNS WTA Entered By: [name] on 03/16/2023 11:30:56 -------------------------------------------------------------------------------- Multi Wound Chart Details Patient Name: Date of Service: [name] HN H. 03/16/2023 10:15 A M Medical Record Number: [id number] Patient Account Number: [id number] Date of Birth/Sex: Treating RN: 1951-03-28 (72 y.o. [name] Primary Care Dvontae Ruan: [name] Other Clinician: Referring Amyra Vantuyl: Treating Klee Kolek/Extender: [name], [name] in Treatment: 6 Vital Signs Height(in):  66 Pulse(bpm): 73 Weight(lbs): 250 Blood Pressure(mmHg): 131/63 Body Mass Index(BMI): 40.3 Temperature(F): 97.7 Respiratory Rate(breaths/min): 18 [1:Photos:] [3:[id number] Page 5 of 12] Right Calcaneus Right, Distal, Dorsal Foot Right, Proximal, Dorsal Foot Wound Location: Gradually Appeared Gradually Appeared Gradually Appeared Wounding Event: Diabetic Wound/Ulcer of the Lower Diabetic Wound/Ulcer of the Lower Diabetic Wound/Ulcer of the Lower Primary Etiology: Extremity Extremity Extremity Congestive Heart Failure, Coronary  Congestive Heart Failure, Coronary Congestive Heart Failure, Coronary Comorbid History: Artery Disease, Hypertension, Type II Artery Disease, Hypertension, Type II Artery Disease, Hypertension, Type II Diabetes, Neuropathy Diabetes, Neuropathy Diabetes, Neuropathy 12/10/2022 12/10/2022 12/10/2022 Date Acquired: 6 6 6  [name] of Treatment: Open Open Open Wound Status: No No No Wound Recurrence: Yes No No Clustered Wound: 2.8x1.2x0.2 0.8x1x0.2 1.8x2.2x0.3 Measurements L x W x D (cm) 2.639 0.628 3.11 A (cm) : rea 0.528 0.126 0.933 Volume (cm) : 38.20% 27.30% -23.80% % Reduction in A rea: -23.70% -46.50% -271.70% % Reduction in Volume: Grade 1 Grade 1 Grade 1 Classification: Medium Medium Medium Exudate A mount: Serosanguineous Serosanguineous Serosanguineous Exudate Type: red, brown red, brown red, brown Exudate Color: Medium (34-66%) None Present (0%) None Present (0%) Granulation A mount: Red N/A N/A Granulation Quality: None Present (0%) None Present (0%) Large (67-100%) Necrotic A mount: Fat Layer (Subcutaneous Tissue): Yes Fat Layer (Subcutaneous Tissue): Yes Fat Layer (Subcutaneous Tissue): Yes Exposed Structures: Fascia: No Fascia: No Fascia: No Tendon: No Tendon: No Tendon: No Muscle: No Muscle: No Muscle: No Joint: No Joint: No Joint: No Bone: No Bone: No Bone: No None Small (1-33%) Small  (1-33%) Epithelialization: Treatment Notes Electronic Signature(s) Signed: 03/16/2023 4:23:38 PM By: William Aver MSN RN CNS WTA Entered By: [name] on 03/16/2023 11:31:48 -------------------------------------------------------------------------------- Multi-Disciplinary Care Plan Details Patient Name: Date of Service: [name] HN H. 03/16/2023 10:15 A M Medical Record Number: [id number] Patient Account Number: [id number] Date of Birth/Sex: Treating RN: [birth year] (72 y.o. [name] Primary Care Blain Hunsucker: [name] Other Clinician: Referring Navraj Dreibelbis: Treating Linton Stolp/Extender: [name], [name] in Treatment: 6 Active Inactive Necrotic Tissue Nursing Diagnoses: Impaired tissue integrity related to necrotic/devitalized tissue Knowledge deficit related to management of necrotic/devitalized tissue Goals: Necrotic/devitalized tissue will be minimized in the wound bed Date Initiated: 02/01/2023 Date Inactivated: 02/28/2023 Target Resolution Date: 03/04/2023 Goal Status: Met Patient/caregiver will verbalize understanding of reason and process for debridement of necrotic tissue AMYR, SLUDER ([id number]) [id number].pdf Page 6 of 12 Date Initiated: 02/01/2023 Target Resolution Date: 03/25/2023 Goal Status: Active Interventions: Assess patient pain level pre-, during and post procedure and prior to discharge Provide education on necrotic tissue and debridement process Treatment Activities: Apply topical anesthetic as ordered : 01/31/2023 Notes: Wound/Skin Impairment Nursing Diagnoses: Impaired tissue integrity Knowledge deficit related to ulceration/compromised skin integrity Goals: Patient/caregiver will verbalize understanding of skin care regimen Date Initiated: 02/01/2023 Date Inactivated: 02/28/2023 Target Resolution Date: 03/04/2023 Goal Status: Met Ulcer/skin breakdown will have a volume reduction of 30% by week 4 Date Initiated:  02/01/2023 Date Inactivated: 02/28/2023 Target Resolution Date: 03/04/2023 Goal Status: Met Ulcer/skin breakdown will have a volume reduction of 50% by week 8 Date Initiated: 02/01/2023 Target Resolution Date: 04/04/2023 Goal Status: Active Ulcer/skin breakdown will have a volume reduction of 80% by week 12 Date Initiated: 02/01/2023 Target Resolution Date: 05/05/2023 Goal Status: Active Interventions: Assess patient/caregiver ability to obtain necessary supplies Assess patient/caregiver ability to perform ulcer/skin care regimen upon admission and as needed Assess ulceration(s) every visit Provide education on ulcer and skin care Treatment Activities: Referred to DME Elzia Hott for dressing supplies : 01/31/2023 Skin care regimen initiated : 01/31/2023 Notes: Electronic Signature(s) Signed: 03/16/2023 4:23:38 PM By: William Aver MSN RN CNS WTA Entered By: [name] on 03/16/2023 11:45:51 -------------------------------------------------------------------------------- Pain Assessment Details Patient Name: Date of Service: [name] HN H. 03/16/2023 10:15 A M Medical Record Number: [id number] Patient Account Number: [id number] Date of Birth/Sex: Treating RN: January 30, 1951 (72 y.o. M)  [name] Primary Care Jonathon Tan: [name] Other Clinician: Referring Coleta Grosshans: Treating Vergia Chea/Extender: [name], [name] in Treatment: 6 Active Problems Location of Pain Severity and Description of Pain Patient Has Paino No Site Locations CASSIAN, TORELLI H ([id number]) [id number] Page 7 of 12 Pain Management and Medication Current Pain Management: Electronic Signature(s) Signed: 03/16/2023 4:23:38 PM By: William Aver MSN RN CNS WTA Entered By: [name] on 03/16/2023 11:16:59 -------------------------------------------------------------------------------- Patient/Caregiver Education Details Patient Name: Date of Service: [name] 7/18/2024andnbsp10:15 A  M Medical Record Number: [id number] Patient Account Number: [id number] Date of Birth/Gender: Treating RN: 08/29/1951 (72 y.o. [name] Primary Care Physician: [name] Other Clinician: Referring Physician: Treating Physician/Extender: [name], [name] in Treatment: 6 Education Assessment Education Provided To: Patient Education Topics Provided Wound/Skin Impairment: Handouts: Caring for Your Ulcer Methods: Explain/Verbal Responses: State content correctly Electronic Signature(s) Signed: 03/16/2023 4:23:38 PM By: William Aver MSN RN CNS WTA Entered By: [name] on 03/16/2023 11:46:02 [name] ([id number]) [id number] Page 8 of 12 -------------------------------------------------------------------------------- Wound Assessment Details Patient Name: Date of Service: [name] [facility] H. 03/16/2023 10:15 A M Medical Record Number: [id number] Patient Account Number: [id number] Date of Birth/Sex: Treating RN: 13-Nov-1950 (72 y.o. [name] Primary Care Semir Brill: [name] Other Clinician: Referring Javone Ybanez: Treating Audriana Aldama/Extender: [name], [name] in Treatment: 6 Wound Status Wound Number: 1 Primary Diabetic Wound/Ulcer of the Lower Extremity Etiology: Wound Location: Right Calcaneus Wound Open Wounding Event: Gradually Appeared Status: Date Acquired: 12/10/2022 Comorbid Congestive Heart Failure, Coronary Artery Disease, [name] Of Treatment: 6 History: Hypertension, Type II Diabetes, Neuropathy Clustered Wound: Yes Photos Wound Measurements Length: (cm) 2.8 Width: (cm) 1.2 Depth: (cm) 0.2 Area: (cm) 2.639 Volume: (cm) 0.528 % Reduction in Area: 38.2% % Reduction in Volume: -23.7% Epithelialization: None Wound Description Classification: Grade 1 Exudate Amount: Medium Exudate Type: Serosanguineous Exudate Color: red, brown Foul Odor After Cleansing: No Slough/Fibrino No Wound  Bed Granulation Amount: Medium (34-66%) Exposed Structure Granulation Quality: Red Fascia Exposed: No Necrotic Amount: None Present (0%) Fat Layer (Subcutaneous Tissue) Exposed: Yes Tendon Exposed: No Muscle Exposed: No Joint Exposed: No Bone Exposed: No Treatment Notes Wound #1 (Calcaneus) Wound Laterality: Right Cleanser Byram Ancillary Kit - 15 Day Supply Discharge Instruction: Use supplies as instructed; Kit contains: (15) Saline Bullets; (15) 3x3 Gauze; 15 pr [address] RANNY, WIEBELHAUS ([id number]) [id number] Page 9 of 12 Discharge Instruction: Gently cleanse wound with antibacterial soap, rinse and pat dry prior to dressing wounds Peri-Wound Care Topical Primary Dressing IODOFLEX 0.9% Cadexomer Iodine Pad Discharge Instruction: Apply Iodoflex to wound bed only as directed. Secondary Dressing (BORDER) Zetuvit Plus SILICONE BORDER Dressing 4x4 (in/in) Discharge Instruction: Please do not put silicone bordered dressings under wraps. Use non-bordered dressing only. Secured With Compression Wrap Compression Stockings Facilities manager) Signed: 03/16/2023 4:23:38 PM By: William Aver MSN RN CNS WTA Entered By: [name] on 03/16/2023 11:29:24 -------------------------------------------------------------------------------- Wound Assessment Details Patient Name: Date of Service: [name] HN H. 03/16/2023 10:15 A M Medical Record Number: [id number] Patient Account Number: [id number] Date of Birth/Sex: Treating RN: August 24, 1951 (72 y.o. [name] Primary Care Armel Rabbani: [name] Other Clinician: Referring Charish Schroepfer: Treating Taiana Temkin/Extender: [name], [name] in Treatment: 6 Wound Status Wound Number: 2 Primary Diabetic Wound/Ulcer of the Lower Extremity Etiology: Wound Location: Right, Distal, Dorsal Foot Wound Open Wounding Event: Gradually Appeared Status: Date Acquired: 12/10/2022 Comorbid  Congestive Heart Failure, Coronary Artery Disease, [name]  Of Treatment: 6 History: Hypertension, Type II Diabetes, Neuropathy Clustered Wound: No Photos Wound Measurements Length: (cm) 0.8 Width: (cm) 1 Depth: (cm) 0.2 Area: (cm) 0.628 MANSUR, PATTI ([id number]) Volume: (cm) 0.126 % Reduction in Area: 27.3% % Reduction in Volume: -46.5% Epithelialization: Small (1-33%) [id number] Page 10 of 12 Wound Description Classification: Grade 1 Exudate Amount: Medium Exudate Type: Serosanguineous Exudate Color: red, brown Foul Odor After Cleansing: No Slough/Fibrino No Wound Bed Granulation Amount: None Present (0%) Exposed Structure Necrotic Amount: None Present (0%) Fascia Exposed: No Fat Layer (Subcutaneous Tissue) Exposed: Yes Tendon Exposed: No Muscle Exposed: No Joint Exposed: No Bone Exposed: No Treatment Notes Wound #2 (Foot) Wound Laterality: Dorsal, Right, Distal Cleanser Byram Ancillary Kit - 15 Day Supply Discharge Instruction: Use supplies as instructed; Kit contains: (15) Saline Bullets; (15) 3x3 Gauze; 15 pr Gloves Soap and Water Discharge Instruction: Gently cleanse wound with antibacterial soap, rinse and pat dry prior to dressing wounds Peri-Wound Care Topical Primary Dressing IODOFLEX 0.9% Cadexomer Iodine Pad Discharge Instruction: Apply Iodoflex to wound bed only as directed. Secondary Dressing (BORDER) Zetuvit Plus SILICONE BORDER Dressing 4x4 (in/in) Discharge Instruction: Please do not put silicone bordered dressings under wraps. Use non-bordered dressing only. Secured With Compression Wrap Compression Stockings Facilities manager) Signed: 03/16/2023 4:23:38 PM By: William Aver MSN RN CNS WTA Entered By: [name] on 03/16/2023 11:29:55 -------------------------------------------------------------------------------- Wound Assessment Details Patient Name: Date of Service: [name] HN H. 03/16/2023 10:15 A  M Medical Record Number: [id number] Patient Account Number: [id number] Date of Birth/Sex: Treating RN: Nov 22, 1950 (72 y.o. [name] Primary Care Egor Fullilove: [name] Other Clinician: Referring Joanette Silveria: Treating Nusaiba Guallpa/Extender: [name], [name] in Treatment: [address] Wound Status NAJIB, COLMENARES ([id number]) [id number] Page 11 of 12 Wound Number: 3 Primary Diabetic Wound/Ulcer of the Lower Extremity Etiology: Wound Location: Right, Proximal, Dorsal Foot Wound Open Wounding Event: Gradually Appeared Status: Date Acquired: 12/10/2022 Comorbid Congestive Heart Failure, Coronary Artery Disease, [name] Of Treatment: 6 History: Hypertension, Type II Diabetes, Neuropathy Clustered Wound: No Photos Wound Measurements Length: (cm) 1.8 Width: (cm) 2.2 Depth: (cm) 0.3 Area: (cm) 3.11 Volume: (cm) 0.933 % Reduction in Area: -23.8% % Reduction in Volume: -271.7% Epithelialization: Small (1-33%) Wound Description Classification: Grade 1 Exudate Amount: Medium Exudate Type: Serosanguineous Exudate Color: red, brown Foul Odor After Cleansing: No Slough/Fibrino Yes Wound Bed Granulation Amount: None Present (0%) Exposed Structure Necrotic Amount: Large (67-100%) Fascia Exposed: No Necrotic Quality: Adherent Slough Fat Layer (Subcutaneous Tissue) Exposed: Yes Tendon Exposed: No Muscle Exposed: No Joint Exposed: No Bone Exposed: No Treatment Notes Wound #3 (Foot) Wound Laterality: Dorsal, Right, Proximal Cleanser Soap and Water Discharge Instruction: Gently cleanse wound with antibacterial soap, rinse and pat dry prior to dressing wounds Peri-Wound Care Topical Primary Dressing IODOFLEX 0.9% Cadexomer Iodine Pad Discharge Instruction: Apply Iodoflex to wound bed only as directed. Secondary Dressing (BORDER) Zetuvit Plus SILICONE BORDER Dressing 4x4 (in/in) Discharge Instruction: Please do not put silicone bordered dressings under  wraps. Use non-bordered dressing only. Secured With Compression Wrap Compression Stockings Facilities manager) Signed: 03/16/2023 4:23:38 PM By: William Aver MSN RN CNS WTA Entered By: [name] on 03/16/2023 11:30:17 [name] ([id number]) [id number] Page 12 of 12 -------------------------------------------------------------------------------- Vitals Details Patient Name: Date of Service: [name] [facility] H. 03/16/2023 10:15 A M Medical Record Number: [id number] Patient Account Number: [id number] Date of Birth/Sex: Treating RN: 1950/10/27 (72 y.o. [name] Primary Care Elchanan Bob: [name] Other Clinician: Referring Sharmin Foulk: Treating Nadege Carriger/Extender:  [employer], [name] in Treatment: 6 Vital Signs Time Taken: 11:13 Temperature (F): 97.7 Height (in): 66 Pulse (bpm): 73 Weight (lbs): 250 Respiratory Rate (breaths/min): 18 Body Mass Index (BMI): 40.3 Blood Pressure (mmHg): 131/63 Reference Range: 80 - 120 mg / dl Electronic Signature(s) Signed: 03/16/2023 4:23:38 PM By: William Aver MSN RN CNS WTA Entered By: [name] on 03/16/2023 11:16:53

## 2023-03-20 DIAGNOSIS — E11622 Type 2 diabetes mellitus with other skin ulcer: Secondary | ICD-10-CM | POA: Diagnosis not present

## 2023-03-22 ENCOUNTER — Other Ambulatory Visit (HOSPITAL_COMMUNITY): Payer: Self-pay

## 2023-03-22 ENCOUNTER — Telehealth: Payer: Self-pay | Admitting: Cardiovascular Disease

## 2023-03-22 NOTE — Telephone Encounter (Signed)
Contacted [employer] for status update. OOP Expenses provided are not enough for approval at this time. Informed pt that we can submit additional expense reports if/when the criteria is met. Patient is also encouraged to request samples from the office, or discuss a change in therapy with the provider at the next visit.

## 2023-03-22 NOTE — Telephone Encounter (Signed)
Pt c/o medication issue:  1. Name of Medication:   apixaban (ELIQUIS) 5 MG TABS tablet    2. How are you currently taking this medication (dosage and times per day)? Take 1 tablet (5 mg total) by mouth 2 (two) times daily.   3. Are you having a reaction (difficulty breathing--STAT)? No  4. What is your medication issue? Patient's daughter is calling because the patient's insurance is currently in the donut hole. Patient's daughter is requesting to know their options with getting the medication at a cheaper cost or if their is a cheaper option for this medication. Please advise.

## 2023-03-22 NOTE — Telephone Encounter (Signed)
Patient's daughter called and said that the patient will have to spend $400 more dollars out of pocket before he will qualify for patient assistance. Patient is not able to afford Eliquis. Daughter is asking if there is a cheaper option.

## 2023-03-22 NOTE — Progress Notes (Signed)
Paramedicine Encounter    Patient ID: [name], male    DOB: [birth year], 72 y.o.   MRN: [id number]   Complaints-none   Edema-look better   Compliance with meds-yes  Refills needed-none   Pt reports he has been feeling alright. No dizziness, no c/p. No increased sob.   Pt does not qualify for assistance for the eliquis, he will have to get samples unless his doc changes his meds.  He has [facility] appoint coming up next week and is aware of the meds to hold prior to his appointment.  Last week his b/p was low and his spiro was held the next day since he already had it for that day.  He said he went to foot doc the next day and his b/p was better.  Today his b/p better but still soft. I did send update to tina at [facility] to let her know.   Will f/u next week.   Still no batteries for the scales.      BP 98/60   Pulse 76   Resp 16   SpO2 97%  Weight yesterday-no batteries yet  Last visit weight-252 @ office   Patient Care Team: [name], MD as PCP - General Mariah Milling Tollie Pizza, MD as PCP - Cardiology (Cardiology) Antonieta Iba, MD as Consulting Physician (Cardiology) Kathyrn Sheriff, [facility] (Inactive) as Pharmacist (Pharmacist)  Patient Active Problem List   Diagnosis Date Noted   SOB (shortness of breath) 03/03/2023   Atrial fibrillation (HCC) 02/22/2023   Alteration in mobility associated with pain 01/12/2023   Diabetic ulcer of ankle (HCC) 01/12/2023   Peripheral neuropathy 01/12/2023   NSTEMI (non-ST elevated myocardial infarction) (HCC) 11/05/2022   COVID-19 virus infection 11/05/2022   Penis disorder 05/16/2022   Other fatigue 05/16/2022   Rash 05/12/2022   GAD (generalized anxiety disorder) 09/21/2021   Encounter for power mobility device assessment 05/13/2021   Statin myopathy 03/30/2021   Chronic midline low back pain 11/12/2020   Tinea corporis 04/10/2018   ED (erectile dysfunction) 03/06/2018   Acute on chronic diastolic CHF (congestive  heart failure) (HCC) 09/20/2016   Adjustment disorder with mixed anxiety and depressed mood 09/09/2016   Gastroesophageal reflux disease 09/09/2016   Elevated left ventricular end-diastolic pressure (LVEDP) 03/11/2016   Anemia 03/11/2016   Hypertensive heart disease with heart failure (HCC)    Diabetes mellitus type 2 with retinopathy (HCC)    Coronary artery disease of native artery of native heart with stable angina pectoris (HCC)    Morbid obesity (HCC)    Chronic chest pain    Diabetic retinopathy (HCC) 09/23/2014   Snoring 12/21/2012   HYPOGONADISM 09/28/2010   B12 deficiency 09/28/2010   Vitamin D deficiency 09/28/2010   OTHER MALAISE AND FATIGUE 09/17/2010   TRIGGER FINGER, RIGHT MIDDLE 09/14/2010   BRANCH RETINAL VEIN OCCLUSION 11/26/2009   CONSTIPATION 08/10/2009   GASTRITIS 07/29/2009   Major depressive disorder, recurrent episode, moderate (HCC) 07/06/2007   RENAL CALCULUS, HX OF 03/26/2007   Hyperlipidemia associated with type 2 diabetes mellitus (HCC) 12/05/2006   Hypertension associated with diabetes (HCC) 12/05/2006   Osteoarthritis 12/05/2006    Current Outpatient Medications:    Acetaminophen (TYLENOL PO), Take 3-4 tablets by mouth as needed (pain)., Disp: , Rfl:    apixaban (ELIQUIS) 5 MG TABS tablet, Take 1 tablet (5 mg total) by mouth 2 (two) times daily., Disp: 56 tablet, Rfl: 0   carvedilol (COREG) 12.5 MG tablet, Take 1 tablet (12.5 mg  total) by mouth 2 (two) times daily with a meal., Disp: 180 tablet, Rfl: 3   cholecalciferol (VITAMIN D3) 25 MCG (1000 UNIT) tablet, Take 2,000 Units by mouth daily., Disp: , Rfl:    clopidogrel (PLAVIX) 75 MG tablet, TAKE ONE TABLET BY MOUTH ONCE DAILY WITH BREAKFAST, Disp: 90 tablet, Rfl: 3   Continuous Blood Gluc Sensor (FREESTYLE LIBRE 2 SENSOR) MISC, APPLY SENSOR EVERY 14 DAYS TO MONITOR SUGAR CONTINOUSLY, Disp: 2 each, Rfl: 11   Continuous Glucose Receiver (FREESTYLE LIBRE 2 READER) DEVI, USE WITH SENSORS TO MONITOR SUGAR  CONTINUOUSLY, Disp: 1 each, Rfl: 0   Dulaglutide (TRULICITY) 4.5 MG/0.5ML SOPN, Inject 4.5 mg into the skin once a week. Via [name] PAP, Disp: , Rfl:    empagliflozin (JARDIANCE) 10 MG TABS tablet, Take 1 tablet (10 mg total) by mouth daily., Disp: 30 tablet, Rfl: 5   ENTRESTO 24-26 MG, TAKE ONE TABLET BY MOUTH TWO (TWO) TIMES DAILY., Disp: 60 tablet, Rfl: 3   ezetimibe (ZETIA) 10 MG tablet, TAKE ONE TABLET BY MOUTH ONCE DAILY, Disp: 30 tablet, Rfl: 2   furosemide (LASIX) 40 MG tablet, TAKE ONE TABLET BY MOUTH ONCE A DAY CAN TAKE A 2ND DAILY DOSE AS NEEDED., Disp: 180 tablet, Rfl: 1   guaiFENesin-dextromethorphan (ROBITUSSIN DM) 100-10 MG/5ML syrup, Take 5 mLs by mouth every 4 (four) hours as needed for cough., Disp: 118 mL, Rfl: 0   HYDROcodone-acetaminophen (NORCO) 5-325 MG tablet, Take 1-2 tablets by mouth daily as needed for moderate pain., Disp: 60 tablet, Rfl: 0   HYDROcodone-acetaminophen (NORCO) 5-325 MG tablet, Take 1-2 tablets by mouth daily as needed for moderate pain. April, Disp: 60 tablet, Rfl: 0   HYDROcodone-acetaminophen (NORCO/VICODIN) 5-325 MG tablet, TAKE ONE (1) TO TWO (2) TABLETS BY MOUTH DAILY AS NEEDED FOR MODERATE PAIN. MAY, Disp: 60 tablet, Rfl: 0   hydrOXYzine (ATARAX) 10 MG tablet, TAKE 1 TABLET BY MOUTH 3 TIMES DAILY AS NEEDED FOR ANXIETY, Disp: 30 tablet, Rfl: 2   insulin aspart (NOVOLOG FLEXPEN) 100 UNIT/ML FlexPen, 13 units in AM (scheduled) and 3 units PRN in evening (Patient taking differently: Inject 3-13 Units into the skin in the morning and at bedtime. Inject 13 units in the morning and 3 units at night), Disp: 15 mL, Rfl: 6   isosorbide mononitrate (IMDUR) 30 MG 24 hr tablet, Take 1 tablet (30 mg total) by mouth 2 (two) times daily., Disp: 60 tablet, Rfl: 2   ketoconazole (NIZORAL) 2 % cream, Apply 1 Application topically daily., Disp: 15 g, Rfl: 0   nitroGLYCERIN (NITROSTAT) 0.4 MG SL tablet, DISSOLVE 1 TABLET UNDER TONGUE AS NEEDEDFOR CHEST PAIN. MAY REPEAT  5 MINUTES APART 3 TIMES IF NEEDED, Disp: 25 tablet, Rfl: 3   spironolactone (ALDACTONE) 25 MG tablet, Take 1 tablet (25 mg total) by mouth daily., Disp: 30 tablet, Rfl: 5   terbinafine (LAMISIL) 250 MG tablet, TAKE ONE TABLET (250 MG TOTAL) BY MOUTH DAILY., Disp: 30 tablet, Rfl: 0   TRESIBA FLEXTOUCH 100 UNIT/ML FlexTouch Pen, INJECT 50 UNITS INTO THE SKIN DAILY, Disp: 15 mL, Rfl: 2   triamcinolone cream (KENALOG) 0.5 %, APPLY ONE APPLICATION TOPICALLY TWO (TWO) TIMES DAILY., Disp: 30 g, Rfl: 0   TRUEPLUS 5-BEVEL PEN NEEDLES 31G X 6 MM MISC, USE TO INJECT INSULIN THREE TIMES A DAY, Disp: 300 each, Rfl: 3   venlafaxine XR (EFFEXOR-XR) 150 MG 24 hr capsule, TAKE 1 CAPSULE BY MOUTH DAILY WITH BREAKFAST. TAKE WITH EFFEXOR XR 75MG  FORA TOTAL OF  225MG  (Patient taking differently: Take 150 mg by mouth daily with breakfast. Take with 75 mg), Disp: 90 capsule, Rfl: 0   vitamin B-12 (CYANOCOBALAMIN) 1000 MCG tablet, Take 1,000 mcg by mouth daily., Disp: , Rfl:  Allergies  Allergen Reactions   Bupropion Nausea Only   Ozempic (0.25 Or 0.5 Mg-Dose) [Semaglutide(0.25 Or 0.5mg -Dos)] Nausea And Vomiting   Atorvastatin Other (See Comments)    Body aches Similar effect with rosuvastatin 40 mg twice weekly      Social History   Socioeconomic History   Marital status: Widowed    Spouse name: Not on file   Number of children: 3   Years of education: Not on file   Highest education level: 7th grade  Occupational History   Occupation: disabled    Associate Professor: UNEMPLOYED    Comment: back injury  Tobacco Use   Smoking status: Never   Smokeless tobacco: Never  Vaping Use   Vaping status: Never Used  Substance and Sexual Activity   Alcohol use: No    Alcohol/week: 0.0 standard drinks of alcohol   Drug use: No   Sexual activity: Not Currently  Other Topics Concern   Not on file  Social History Narrative   Has a roommate, Mr. [name]. No pets.   Social Determinants of Health   Financial Resource  Strain: Low Risk  (11/23/2022)   Overall Financial Resource Strain (CARDIA)    Difficulty of Paying Living Expenses: Not hard at all  Food Insecurity: No Food Insecurity (12/29/2022)   Hunger Vital Sign    Worried About Running Out of Food in the Last Year: Never true    Ran Out of Food in the Last Year: Never true  Transportation Needs: No Transportation Needs (12/29/2022)   PRAPARE - Administrator, Civil Service (Medical): No    Lack of Transportation (Non-Medical): No  Physical Activity: Inactive (11/09/2022)   Exercise Vital Sign    Days of Exercise per Week: 0 days    Minutes of Exercise per Session: 0 min  Stress: No Stress Concern Present (11/09/2022)   [employer] of Occupational Health - Occupational Stress Questionnaire    Feeling of Stress : Only a little  Social Connections: Moderately Isolated (11/09/2022)   Social Connection and Isolation Panel [NHANES]    Frequency of Communication with Friends and Family: More than three times a week    Frequency of Social Gatherings with Friends and Family: More than three times a week    Attends Religious Services: More than 4 times per year    Active Member of [employer] or Organizations: No    Attends Banker Meetings: Never    Marital Status: Widowed  Intimate Partner Violence: Not At Risk (11/09/2022)   Humiliation, Afraid, Rape, and Kick questionnaire    Fear of Current or Ex-Partner: No    Emotionally Abused: No    Physically Abused: No    Sexually Abused: No    Physical Exam      Future Appointments  Date Time Provider Department Center  03/23/2023  1:15 PM Maxwell Caul, MD [facility] None  04/03/2023  3:00 PM Delma Freeze, FNP [facility] None  04/05/2023  1:55 PM Charlsie Quest, NP [facility] None  05/18/2023  8:00 AM [facility]  05/25/2023  2:40 PM [name], MD [facility]  11/14/2023 10:45 AM [facility] ANNUAL WELLNESS VISIT 1 [facility]       Kerry Hough,  Paramedic [phone] [facility] Paramedic  03/22/23 

## 2023-03-23 ENCOUNTER — Encounter: Payer: PPO | Admitting: Internal Medicine

## 2023-03-23 ENCOUNTER — Encounter: Payer: Self-pay | Admitting: Cardiology

## 2023-03-23 DIAGNOSIS — E11621 Type 2 diabetes mellitus with foot ulcer: Secondary | ICD-10-CM | POA: Diagnosis not present

## 2023-03-23 DIAGNOSIS — L97512 Non-pressure chronic ulcer of other part of right foot with fat layer exposed: Secondary | ICD-10-CM | POA: Diagnosis not present

## 2023-03-23 DIAGNOSIS — L97412 Non-pressure chronic ulcer of right heel and midfoot with fat layer exposed: Secondary | ICD-10-CM | POA: Diagnosis not present

## 2023-03-23 NOTE — Progress Notes (Signed)
[name] ([id number]) [id number] Page 1 of 12 Visit Report for 03/23/2023 Arrival Information Details Patient Name: Date of Service: [name] [facility] H. 03/23/2023 1:15 PM Medical Record Number: [id number] Patient Account Number: [id number] Date of Birth/Sex: Treating RN: 06-29-1951 (72 y.o. [name] Primary Care [name]: [name] Other Clinician: Referring [name]: Treating [name]/Extender: RO BSO N, [name], [name] in Treatment: 7 Visit Information History Since Last Visit Added or deleted any medications: No Patient Arrived: Ambulatory Any new allergies or adverse reactions: No Arrival Time: 13:24 Has Dressing in Place as Prescribed: Yes Accompanied By: self Pain Present Now: No Transfer Assistance: None Patient Identification Verified: Yes Secondary Verification Process Completed: Yes Patient Requires Transmission-Based Precautions: No Patient Has Alerts: Yes Patient Alerts: Patient on Blood Thinner Diabetes type 2 Eliquis/Plavix ABI R 1.23 TBI 0.57 ABI L 0.98 TBI 0.80 Electronic Signature(s) Signed: 03/23/2023 4:43:06 PM By: William Aver MSN RN CNS WTA Entered By: [name] on 03/23/2023 13:25:23 -------------------------------------------------------------------------------- Clinic Level of Care Assessment Details Patient Name: Date of Service: [name] [facility] H. 03/23/2023 1:15 PM Medical Record Number: [id number] Patient Account Number: [id number] Date of Birth/Sex: Treating RN: Jun 03, 1951 (72 y.o. [name] Primary Care [name]: [name] Other Clinician: Referring [name]: Treating [name]/Extender: RO BSO N, [name], [name] in Treatment: 7 Clinic Level of Care Assessment Items TOOL 4 Quantity Score X- 1 0 Use when only an EandM is performed on FOLLOW-UP visit ASSESSMENTS - Nursing Assessment / Reassessment X- 1 10 Reassessment of Co-morbidities (includes updates in patient  status) X- 1 5 Reassessment of Adherence to Treatment Plan [name] ([id number]) [id number] Page 2 of 12 ASSESSMENTS - Wound and Skin A ssessment / Reassessment []  - Simple Wound Assessment / Reassessment - one wound 0 X- 3 5 Complex Wound Assessment / Reassessment - multiple wounds []  - 0 Dermatologic / Skin Assessment (not related to wound area) ASSESSMENTS - Focused Assessment []  - 0 Circumferential Edema Measurements - multi extremities []  - 0 Nutritional Assessment / Counseling / Intervention []  - 0 Lower Extremity Assessment (monofilament, tuning fork, pulses) []  - 0 Peripheral Arterial Disease Assessment (using hand held doppler) ASSESSMENTS - Ostomy and/or Continence Assessment and Care []  - 0 Incontinence Assessment and Management []  - 0 Ostomy Care Assessment and Management (repouching, etc.) PROCESS - Coordination of Care []  - 0 Simple Patient / Family Education for ongoing care X- 1 20 Complex (extensive) Patient / Family Education for ongoing care []  - 0 Staff obtains Chiropractor, Records, T Results / Process Orders est []  - 0 Staff telephones HHA, Nursing Homes / Clarify orders / etc []  - 0 Routine Transfer to another Facility (non-emergent condition) []  - 0 Routine Hospital Admission (non-emergent condition) []  - 0 New Admissions / Manufacturing engineer / Ordering NPWT Apligraf, etc. , []  - 0 Emergency Hospital Admission (emergent condition) []  - 0 Simple Discharge Coordination X- 1 15 Complex (extensive) Discharge Coordination PROCESS - Special Needs []  - 0 Pediatric / Minor Patient Management []  - 0 Isolation Patient Management []  - 0 Hearing / Language / Visual special needs []  - 0 Assessment of Community assistance (transportation, D/C planning, etc.) []  - 0 Additional assistance / Altered mentation []  - 0 Support Surface(s) Assessment (bed, cushion, seat, etc.) INTERVENTIONS - Wound Cleansing /  Measurement []  - 0 Simple Wound Cleansing - one wound X- 3 5 Complex Wound Cleansing - multiple wounds X- 1 5 Wound Imaging (photographs - any  number of wounds) []  - 0 Wound Tracing (instead of photographs) []  - 0 Simple Wound Measurement - one wound X- 3 5 Complex Wound Measurement - multiple wounds INTERVENTIONS - Wound Dressings X - Small Wound Dressing one or multiple wounds 3 10 []  - 0 Medium Wound Dressing one or multiple wounds []  - 0 Large Wound Dressing one or multiple wounds []  - 0 Application of Medications - topical []  - 0 Application of Medications - injection INTERVENTIONS - Miscellaneous []  - 0 External ear exam [name] ([id number]) [id number] Page 3 of 12 []  - 0 Specimen Collection (cultures, biopsies, blood, body fluids, etc.) []  - 0 Specimen(s) / Culture(s) sent or taken to Lab for analysis []  - 0 Patient Transfer (multiple staff / Michiel Sites Lift / Similar devices) []  - 0 Simple Staple / Suture removal (25 or less) []  - 0 Complex Staple / Suture removal (26 or more) []  - 0 Hypo / Hyperglycemic Management (close monitor of Blood Glucose) []  - 0 Ankle / Brachial Index (ABI) - do not check if billed separately X- 1 5 Vital Signs Has the patient been seen at the hospital within the last three years: Yes Total Score: 135 Level Of Care: New/Established - Level 4 Electronic Signature(s) Signed: 03/23/2023 4:43:06 PM By: William Aver MSN RN CNS WTA Entered By: [name] on 03/23/2023 13:59:59 -------------------------------------------------------------------------------- Encounter Discharge Information Details Patient Name: Date of Service: [name] [facility] H. 03/23/2023 1:15 PM Medical Record Number: [id number] Patient Account Number: [id number] Date of Birth/Sex: Treating RN: 1951-02-11 (72 y.o. [name] Primary Care [name]: [name] Other Clinician: Referring [name]: Treating [name]/Extender: RO BSO N,  [name] [name], [name] in Treatment: 7 Encounter Discharge Information Items Discharge Condition: Stable Ambulatory Status: Cane Discharge Destination: Home Transportation: Private Auto Accompanied By: self Schedule Follow-up Appointment: Yes Clinical Summary of Care: Electronic Signature(s) Signed: 03/23/2023 4:43:06 PM By: William Aver MSN RN CNS WTA Entered By: [name] on 03/23/2023 14:01:31 -------------------------------------------------------------------------------- Lower Extremity Assessment Details Patient Name: Date of Service: [name] HN H. 03/23/2023 1:15 PM DRAVIN, LANCE ([id number]) [id number] Page 4 of 12 Medical Record Number: [id number] Patient Account Number: [id number] Date of Birth/Sex: Treating RN: 22-Nov-1950 (72 y.o. [name] Primary Care Torrey Ballinas: [name] Other Clinician: Referring Ramatoulaye Pack: Treating Daeshawn Redmann/Extender: RO BSO N, [name] [name], [name] in Treatment: 7 Edema Assessment Assessed: [Left: No] [Right: No] Edema: [Left: N] [Right: o] Calf Left: Right: Point of Measurement: 30 cm From Medial Instep 40 cm Ankle Left: Right: Point of Measurement: 18 cm From Medial Instep 24 cm Knee To Floor Left: Right: From Medial Instep 40 cm Vascular Assessment Pulses: Dorsalis Pedis Palpable: [Right:Yes] Electronic Signature(s) Signed: 03/23/2023 4:43:06 PM By: William Aver MSN RN CNS WTA Entered By: [name] on 03/23/2023 13:43:45 -------------------------------------------------------------------------------- Multi Wound Chart Details Patient Name: Date of Service: [name] HN H. 03/23/2023 1:15 PM Medical Record Number: [id number] Patient Account Number: [id number] Date of Birth/Sex: Treating RN: 06/23/1951 (72 y.o. [name] Primary Care Ahriyah Vannest: [name] Other Clinician: Referring Mikki Ziff: Treating Mileydi Milsap/Extender: RO BSO N, [name], [name] in  Treatment: 7 Vital Signs Height(in): 66 Pulse(bpm): 73 Weight(lbs): 250 Blood Pressure(mmHg): 119/70 Body Mass Index(BMI): 40.3 Temperature(F): 97.7 Respiratory Rate(breaths/min): 18 [1:Photos:] [3:[id number] Page 5 of 12] Right Calcaneus Right, Distal, Dorsal Foot Right, Proximal, Dorsal Foot Wound Location: Gradually Appeared Gradually Appeared Gradually Appeared Wounding Event: Diabetic Wound/Ulcer of the Lower Diabetic Wound/Ulcer of  the Lower Diabetic Wound/Ulcer of the Lower Primary Etiology: Extremity Extremity Extremity Congestive Heart Failure, Coronary Congestive Heart Failure, Coronary Congestive Heart Failure, Coronary Comorbid History: Artery Disease, Hypertension, Type II Artery Disease, Hypertension, Type II Artery Disease, Hypertension, Type II Diabetes, Neuropathy Diabetes, Neuropathy Diabetes, Neuropathy 12/10/2022 12/10/2022 12/10/2022 Date Acquired: 7 7 7  [name] of Treatment: Open Open Open Wound Status: No No No Wound Recurrence: Yes No No Clustered Wound: 3.5x0.8x0.4 1x0.8x0.2 1.7x1.8x0.3 Measurements L x W x D (cm) 2.199 0.628 2.403 A (cm) : rea 0.88 0.126 0.721 Volume (cm) : 48.50% 27.30% 4.40% % Reduction in A rea: -106.10% -46.50% -187.30% % Reduction in Volume: Grade 1 Grade 1 Grade 1 Classification: Medium Medium Medium Exudate A mount: Serosanguineous Serosanguineous Serosanguineous Exudate Type: red, brown red, brown red, brown Exudate Color: Medium (34-66%) None Present (0%) None Present (0%) Granulation A mount: Red N/A N/A Granulation Quality: None Present (0%) None Present (0%) Large (67-100%) Necrotic A mount: Fat Layer (Subcutaneous Tissue): Yes Fat Layer (Subcutaneous Tissue): Yes Fat Layer (Subcutaneous Tissue): Yes Exposed Structures: Fascia: No Fascia: No Fascia: No Tendon: No Tendon: No Tendon: No Muscle: No Muscle: No Muscle: No Joint: No Joint: No Joint: No Bone: No Bone: No Bone:  No None Small (1-33%) Small (1-33%) Epithelialization: Treatment Notes Electronic Signature(s) Signed: 03/23/2023 4:43:06 PM By: William Aver MSN RN CNS WTA Entered By: [name] on 03/23/2023 13:48:46 -------------------------------------------------------------------------------- Multi-Disciplinary Care Plan Details Patient Name: Date of Service: [name] HN H. 03/23/2023 1:15 PM Medical Record Number: [id number] Patient Account Number: [id number] Date of Birth/Sex: Treating RN: [birth year] (72 y.o. [name] Primary Care Kona Yusuf: [name] Other Clinician: Referring Aubrei Bouchie: Treating Kemuel Buchmann/Extender: RO BSO N, [name] [name], [name] in Treatment: 7 Active Inactive Necrotic Tissue Nursing Diagnoses: Impaired tissue integrity related to necrotic/devitalized tissue Knowledge deficit related to management of necrotic/devitalized tissue Goals: Necrotic/devitalized tissue will be minimized in the wound bed Date Initiated: 02/01/2023 Date Inactivated: 02/28/2023 Target Resolution Date: 03/04/2023 Goal Status: Met Patient/caregiver will verbalize understanding of reason and process for debridement of necrotic tissue STANISLAW, ACTON ([id number]) [id number] Page 6 of 12 Date Initiated: 02/01/2023 Target Resolution Date: 04/25/2023 Goal Status: Active Interventions: Assess patient pain level pre-, during and post procedure and prior to discharge Provide education on necrotic tissue and debridement process Treatment Activities: Apply topical anesthetic as ordered : 01/31/2023 Notes: Wound/Skin Impairment Nursing Diagnoses: Impaired tissue integrity Knowledge deficit related to ulceration/compromised skin integrity Goals: Patient/caregiver will verbalize understanding of skin care regimen Date Initiated: 02/01/2023 Date Inactivated: 02/28/2023 Target Resolution Date: 03/04/2023 Goal Status: Met Ulcer/skin breakdown will have a volume reduction of  30% by week 4 Date Initiated: 02/01/2023 Date Inactivated: 02/28/2023 Target Resolution Date: 03/04/2023 Goal Status: Met Ulcer/skin breakdown will have a volume reduction of 50% by week 8 Date Initiated: 02/01/2023 Target Resolution Date: 04/04/2023 Goal Status: Active Ulcer/skin breakdown will have a volume reduction of 80% by week 12 Date Initiated: 02/01/2023 Target Resolution Date: 05/05/2023 Goal Status: Active Interventions: Assess patient/caregiver ability to obtain necessary supplies Assess patient/caregiver ability to perform ulcer/skin care regimen upon admission and as needed Assess ulceration(s) every visit Provide education on ulcer and skin care Treatment Activities: Referred to DME Miryam Mcelhinney for dressing supplies : 01/31/2023 Skin care regimen initiated : 01/31/2023 Notes: Electronic Signature(s) Signed: 03/23/2023 4:43:06 PM By: William Aver MSN RN CNS WTA Entered By: [name] on 03/23/2023 14:00:31 -------------------------------------------------------------------------------- Pain Assessment Details Patient Name: Date of Service: [name] HN H. 03/23/2023 1:15  PM Medical Record Number: [id number] Patient Account Number: [id number] Date of Birth/Sex: Treating RN: 12-21-1950 (72 y.o. [name] Primary Care Candace Begue: [name] Other Clinician: Referring N[employer]: Treating Amel Gianino/Extender: RO BSO N, [name], [name] in Treatment: 7 Active Problems Location of Pain Severity and Description of Pain Patient Has Paino Yes Site Locations Rate the pain. JACEION, ADAY ([id number]) [id number] Page 7 of 12 Rate the pain. Current Pain Level: 2 Pain Management and Medication Current Pain Management: Electronic Signature(s) Signed: 03/23/2023 2:05:35 PM By: William Aver MSN RN CNS WTA Entered By: [name] on 03/23/2023 14:05:35 -------------------------------------------------------------------------------- Patient/Caregiver  Education Details Patient Name: Date of Service: [name] 7/25/2024andnbsp1:15 PM Medical Record Number: [id number] Patient Account Number: [id number] Date of Birth/Gender: Treating RN: 03/28/1951 (72 y.o. [name] Primary Care Physician: [name] Other Clinician: Referring Physician: Treating Physician/Extender: RO BSO N, [name] [name], [name] in Treatment: 7 Education Assessment Education Provided To: Patient Education Topics Provided Wound/Skin Impairment: Handouts: Caring for Your Ulcer Methods: Explain/Verbal Responses: State content correctly Electronic Signature(s) Signed: 03/23/2023 4:43:06 PM By: William Aver MSN RN CNS WTA Entered By: [name] on 03/23/2023 14:00:43 [name] ([id number]) [id number] Page 8 of 12 -------------------------------------------------------------------------------- Wound Assessment Details Patient Name: Date of Service: [name] 03/23/2023 1:15 PM Medical Record Number: [id number] Patient Account Number: [id number] Date of Birth/Sex: Treating RN: 04-01-1951 (72 y.o. [name] Primary Care Venola Castello: [name] Other Clinician: Referring Somaly Marteney: Treating Farhiya Rosten/Extender: RO BSO N, [name] [name], [name] in Treatment: 7 Wound Status Wound Number: 1 Primary Diabetic Wound/Ulcer of the Lower Extremity Etiology: Wound Location: Right Calcaneus Wound Open Wounding Event: Gradually Appeared Status: Date Acquired: 12/10/2022 Comorbid Congestive Heart Failure, Coronary Artery Disease, [name] Of Treatment: 7 History: Hypertension, Type II Diabetes, Neuropathy Clustered Wound: Yes Photos Wound Measurements Length: (cm) 3.5 Width: (cm) 0.8 Depth: (cm) 0.4 Area: (cm) 2.199 Volume: (cm) 0.88 % Reduction in Area: 48.5% % Reduction in Volume: -106.1% Epithelialization: None Wound Description Classification: Grade 1 Exudate Amount: Medium Exudate Type:  Serosanguineous Exudate Color: red, brown Foul Odor After Cleansing: No Slough/Fibrino No Wound Bed Granulation Amount: Medium (34-66%) Exposed Structure Granulation Quality: Red Fascia Exposed: No Necrotic Amount: None Present (0%) Fat Layer (Subcutaneous Tissue) Exposed: Yes Tendon Exposed: No Muscle Exposed: No Joint Exposed: No Bone Exposed: No Treatment Notes Wound #1 (Calcaneus) Wound Laterality: Right Cleanser Byram Ancillary Kit - 15 Day Supply Discharge Instruction: Use supplies as instructed; Kit contains: (15) Saline Bullets; (15) 3x3 Gauze; 15 pr [address] NITISH, ROES H ([id number]) [id number] Page 9 of 12 Discharge Instruction: Gently cleanse wound with antibacterial soap, rinse and pat dry prior to dressing wounds Peri-Wound Care Topical Primary Dressing IODOFLEX 0.9% Cadexomer Iodine Pad Discharge Instruction: Apply Iodoflex to wound bed only as directed. Secondary Dressing (BORDER) Zetuvit Plus SILICONE BORDER Dressing 4x4 (in/in) Discharge Instruction: Please do not put silicone bordered dressings under wraps. Use non-bordered dressing only. Secured With Compression Wrap Compression Stockings Facilities manager) Signed: 03/23/2023 4:43:06 PM By: William Aver MSN RN CNS WTA Entered By: [name] on 03/23/2023 13:41:51 -------------------------------------------------------------------------------- Wound Assessment Details Patient Name: Date of Service: [name] HN H. 03/23/2023 1:15 PM Medical Record Number: [id number] Patient Account Number: [id number] Date of Birth/Sex: Treating RN: [birth year] (72 y.o. [name] Primary Care Ruthetta Koopmann: [name] Other Clinician: Referring Kiyan Burmester: Treating Emmalou Hunger/Extender: RO BSO N, [name] [name], [name]  in Treatment: 7 Wound Status Wound Number: 2 Primary Diabetic Wound/Ulcer of the Lower Extremity Etiology: Wound Location: Right, Distal,  Dorsal Foot Wound Open Wounding Event: Gradually Appeared Status: Date Acquired: 12/10/2022 Comorbid Congestive Heart Failure, Coronary Artery Disease, [name] Of Treatment: 7 History: Hypertension, Type II Diabetes, Neuropathy Clustered Wound: No Photos Wound Measurements Length: (cm) 1 Width: (cm) 0.8 Depth: (cm) 0.2 Area: (cm) 0.628 MARCELLE, BEBOUT ([id number]) Volume: (cm) 0.126 % Reduction in Area: 27.3% % Reduction in Volume: -46.5% Epithelialization: Small (1-33%) [id number] Page 10 of 12 Wound Description Classification: Grade 1 Exudate Amount: Medium Exudate Type: Serosanguineous Exudate Color: red, brown Foul Odor After Cleansing: No Slough/Fibrino No Wound Bed Granulation Amount: None Present (0%) Exposed Structure Necrotic Amount: None Present (0%) Fascia Exposed: No Fat Layer (Subcutaneous Tissue) Exposed: Yes Tendon Exposed: No Muscle Exposed: No Joint Exposed: No Bone Exposed: No Treatment Notes Wound #2 (Foot) Wound Laterality: Dorsal, Right, Distal Cleanser Byram Ancillary Kit - 15 Day Supply Discharge Instruction: Use supplies as instructed; Kit contains: (15) Saline Bullets; (15) 3x3 Gauze; 15 pr Gloves Soap and Water Discharge Instruction: Gently cleanse wound with antibacterial soap, rinse and pat dry prior to dressing wounds Peri-Wound Care Topical Primary Dressing IODOFLEX 0.9% Cadexomer Iodine Pad Discharge Instruction: Apply Iodoflex to wound bed only as directed. Secondary Dressing (BORDER) Zetuvit Plus SILICONE BORDER Dressing 4x4 (in/in) Discharge Instruction: Please do not put silicone bordered dressings under wraps. Use non-bordered dressing only. Secured With Compression Wrap Compression Stockings Facilities manager) Signed: 03/23/2023 4:43:06 PM By: William Aver MSN RN CNS WTA Entered By: [name] on 03/23/2023  13:42:21 -------------------------------------------------------------------------------- Wound Assessment Details Patient Name: Date of Service: [name] HN H. 03/23/2023 1:15 PM Medical Record Number: [id number] Patient Account Number: [id number] Date of Birth/Sex: Treating RN: [birth year] (72 y.o. [name] Primary Care Mabel Unrein: [name] Other Clinician: Referring Renwick Asman: Treating Sequoya Hogsett/Extender: RO BSO N, [name] [name], [name] in Treatment: 7 Wound Status OCTAVIAN, GODEK ([id number]) [id number] Page 11 of 12 Wound Number: 3 Primary Diabetic Wound/Ulcer of the Lower Extremity Etiology: Wound Location: Right, Proximal, Dorsal Foot Wound Open Wounding Event: Gradually Appeared Status: Date Acquired: 12/10/2022 Comorbid Congestive Heart Failure, Coronary Artery Disease, [name] Of Treatment: 7 History: Hypertension, Type II Diabetes, Neuropathy Clustered Wound: No Photos Wound Measurements Length: (cm) 1.7 Width: (cm) 1.8 Depth: (cm) 0.3 Area: (cm) 2.403 Volume: (cm) 0.721 % Reduction in Area: 4.4% % Reduction in Volume: -187.3% Epithelialization: Small (1-33%) Wound Description Classification: Grade 1 Exudate Amount: Medium Exudate Type: Serosanguineous Exudate Color: red, brown Foul Odor After Cleansing: No Slough/Fibrino Yes Wound Bed Granulation Amount: None Present (0%) Exposed Structure Necrotic Amount: Large (67-100%) Fascia Exposed: No Necrotic Quality: Adherent Slough Fat Layer (Subcutaneous Tissue) Exposed: Yes Tendon Exposed: No Muscle Exposed: No Joint Exposed: No Bone Exposed: No Treatment Notes Wound #3 (Foot) Wound Laterality: Dorsal, Right, Proximal Cleanser Soap and Water Discharge Instruction: Gently cleanse wound with antibacterial soap, rinse and pat dry prior to dressing wounds Peri-Wound Care Topical Primary Dressing IODOFLEX 0.9% Cadexomer Iodine Pad Discharge Instruction: Apply Iodoflex  to wound bed only as directed. Secondary Dressing (BORDER) Zetuvit Plus SILICONE BORDER Dressing 4x4 (in/in) Discharge Instruction: Please do not put silicone bordered dressings under wraps. Use non-bordered dressing only. Secured With Compression Wrap Compression Stockings Facilities manager) Signed: 03/23/2023 4:43:06 PM By: William Aver MSN RN CNS WTA Entered By: [name] on 03/23/2023 13:42:43 Pitter, [name] ([id number]) [id number] Page 12 of 12 -------------------------------------------------------------------------------- Vitals Details  Patient Name: Date of Service: [name] 03/23/2023 1:15 PM Medical Record Number: [id number] Patient Account Number: [id number] Date of Birth/Sex: Treating RN: 05-20-1951 (72 y.o. [name] Primary Care Jaskirat Zertuche: [name] Other Clinician: Referring Sriram Febles: Treating Hollie Wojahn/Extender: RO BSO N, [name], [name] in Treatment: 7 Vital Signs Time Taken: 13:27 Temperature (F): 97.7 Height (in): 66 Pulse (bpm): 73 Weight (lbs): 250 Respiratory Rate (breaths/min): 18 Body Mass Index (BMI): 40.3 Blood Pressure (mmHg): 119/70 Reference Range: 80 - 120 mg / dl Electronic Signature(s) Signed: 03/23/2023 4:43:06 PM By: William Aver MSN RN CNS WTA Entered By: [name] on 03/23/2023 13:29:56

## 2023-03-24 NOTE — Progress Notes (Signed)
JERREN, LANDESMAN ([id number]) [id number] Page 1 of 6 Visit Report for 03/23/2023 HPI Details Patient Name: Date of Service: [name] 03/23/2023 1:15 PM Medical Record Number: [id number] Patient Account Number: [id number] Date of Birth/Sex: Treating RN: [birth year] (72 y.o. [name] Primary Care Provider: Kerby Nora Other Clinician: Referring Provider: Treating Provider/Extender: RO BSO N, MICHA EL Vernie Ammons, [name] in Treatment: 7 History of Present Illness HPI Description: 01-31-2023 upon evaluation today patient appears to be doing poorly currently in regard to wounds on his right heel, right dorsal foot, and right lateral foot. Subsequently he does have known peripheral vascular disease and again this is something that he needs to I think Checked formally. This is what the majority of the conversation today hinged around and the patient voiced understanding as far as that is concerned. Fortunately I do not see any signs of active infection locally nor systemically which is great news. Patient does have a history of diabetes mellitus type 2, atrial fibrillation for which she is on long-term anticoagulant Therapy, hypertension, and coronary artery disease. Patient's hemoglobin A1c most recently was on 11-17-2022 and was 7.2 and currently he is on Eliquis and Plavix. 02-07-2023 upon evaluation today patient appears to be doing well currently in regard to his wounds all things considered I feel like we are still maintaining that does not seem like it is any worse is also not significantly better. I discussed with the patient that I do believe he would benefit from the arterial evaluation we still need to get this done as quickly as possible and subsequently we did put in a follow-up call with the vascular office today with regard to this. 02-14-2023 upon evaluation today patient appears to be doing a little better in regard to his wounds which are showing  signs of loosening which is great news. With that being said he is experiencing an improvement overall in his symptoms and we are still waiting on the vascular evaluation want to get this result and consider whether or not his blood flow is good or not we will be able to make a better determination of next steps. For now we will try and avoid any aggressive sharp debridement to know that he has good arterial flow. 02-21-2023 upon evaluation today patient appears to be doing about the same in regard to his wound. Fortunately there does not appear to be any signs of active infection at this time which is great news. No fevers, chills, nausea, vomiting, or diarrhea. I did review patient's arterial study and it appears that he has pretty good flow in the left is not perfect but it is decent. On the right however he is definitely not doing nearly as good and I think that he is going to need to see one of the vascular doctors for further evaluation and treatment of this right leg in order to get these wounds to heal. 02-28-2023 upon evaluation today patient appears to be doing well currently in regard to his wound. He has been tolerating the dressing changes without complication. Fortunately I do not see any evidence of active infection locally nor systemically at this time. I do think that the eschar started to soften up and I would like to try to get some of this off to see if we can get things moving in the right direction. He is in agreement with this plan. 03-09-2023 upon evaluation today patient appears to be doing well currently in regard to his wound.  He has been tolerating the dressing changes without complication. Fortunately there does not appear to be any signs of active infection locally nor systemically at this time which is great news. I do believe clearing out some of the necrotic debris last week has helped to a degree. 03-16-2023 upon evaluation today patient appears to be doing well currently  in regard to his wounds. He is going to be having a vascular angiogram in order to open up blood flow and I think this is going to be very beneficial for him. Fortunately I do not see any evidence of active infection locally nor systemically which is great news. 7/25; patient with predominantly ischemic wounds to on his dorsal foot and 1 on the right heel. He is going for his angiogram on Monday. He has been using the Iodoflex to the wound and changing this dressing himself. Electronic Signature(s) Signed: 03/24/2023 7:34:23 AM By: Baltazar Najjar MD Entered By: Baltazar Najjar on 03/23/2023 13:57:45 Gosnell, [name] ([id number]) [id number] Page 2 of 6 -------------------------------------------------------------------------------- Physical Exam Details Patient Name: Date of Service: [name] 03/23/2023 1:15 PM Medical Record Number: [id number] Patient Account Number: [id number] Date of Birth/Sex: Treating RN: February 07, 1951 (72 y.o. [name] Primary Care Provider: Kerby Nora Other Clinician: Referring Provider: Treating Provider/Extender: RO BSO N, MICHA EL G Bedsole, [name] in Treatment: 7 Constitutional Sitting or standing Blood Pressure is within target range for patient.. Pulse regular and within target range for patient.Marland Kitchen Respirations regular, non-labored and within target range.. Temperature is normal and within the target range for the patient.Marland Kitchen appears in no distress. Notes Wound exam; the patient has 2 wounds on the dorsal right foot punched-out not completely viable surface. One is just proximal to the right fourth toe and the other further up the foot in line with the right second toe. He also has 2 areas on the right Achilles heel area. All of these wounds are in the same condition not a completely viable surface but not too bad. His foot is cool no palpable dorsalis pedis or posterior tibial pulse in fact I could not feel a popliteal  pulse either Electronic Signature(s) Signed: 03/24/2023 7:34:23 AM By: Baltazar Najjar MD Entered By: Baltazar Najjar on 03/23/2023 13:58:53 -------------------------------------------------------------------------------- Physician Orders Details Patient Name: Date of Service: [name] 03/23/2023 1:15 PM Medical Record Number: [id number] Patient Account Number: [id number] Date of Birth/Sex: Treating RN: Jul 23, 1951 (72 y.o. [name] Primary Care Provider: Kerby Nora Other Clinician: Referring Provider: Treating Provider/Extender: RO BSO N, MICHA EL Vernie Ammons, [name] in Treatment: 7 Verbal / Phone Orders: No Diagnosis Coding Follow-up Appointments Return Appointment in 1 week. Bathing/ [employer] wounds with antibacterial soap and water. Anesthetic (Use 'Patient Medications' Section for Anesthetic Order Entry) Lidocaine applied to wound bed Wound Treatment Wound #1 - Calcaneus Wound Laterality: Right Cleanser: Byram Ancillary Kit - 15 Day Supply (Generic) 3 x Per Week/30 Days Discharge Instructions: Use supplies as instructed; Kit contains: (15) Saline Bullets; (15) 3x3 Gauze; 15 pr Gloves HADRIEN, MILEWSKI ([id number]) [id number] Page 3 of 6 Cleanser: Soap and Water 3 x Per Week/30 Days Discharge Instructions: Gently cleanse wound with antibacterial soap, rinse and pat dry prior to dressing wounds Prim Dressing: IODOFLEX 0.9% Cadexomer Iodine Pad (Generic) 3 x Per Week/30 Days ary Discharge Instructions: Apply Iodoflex to wound bed only as directed. Secondary Dressing: (BORDER) Zetuvit Plus SILICONE BORDER Dressing 4x4 (in/in) (Generic) 3 x Per Week/30 Days Discharge Instructions: Please  do not put silicone bordered dressings under wraps. Use non-bordered dressing only. Wound #2 - Foot Wound Laterality: Dorsal, Right, Distal Cleanser: Byram Ancillary Kit - 15 Day Supply (Generic) 3 x Per Week/30 Days Discharge Instructions: Use  supplies as instructed; Kit contains: (15) Saline Bullets; (15) 3x3 Gauze; 15 pr Gloves Cleanser: Soap and Water 3 x Per Week/30 Days Discharge Instructions: Gently cleanse wound with antibacterial soap, rinse and pat dry prior to dressing wounds Prim Dressing: IODOFLEX 0.9% Cadexomer Iodine Pad (Generic) 3 x Per Week/30 Days ary Discharge Instructions: Apply Iodoflex to wound bed only as directed. Secondary Dressing: (BORDER) Zetuvit Plus SILICONE BORDER Dressing 4x4 (in/in) (Generic) 3 x Per Week/30 Days Discharge Instructions: Please do not put silicone bordered dressings under wraps. Use non-bordered dressing only. Wound #3 - Foot Wound Laterality: Dorsal, Right, Proximal Cleanser: Soap and Water 3 x Per Week/30 Days Discharge Instructions: Gently cleanse wound with antibacterial soap, rinse and pat dry prior to dressing wounds Prim Dressing: IODOFLEX 0.9% Cadexomer Iodine Pad (Generic) 3 x Per Week/30 Days ary Discharge Instructions: Apply Iodoflex to wound bed only as directed. Secondary Dressing: (BORDER) Zetuvit Plus SILICONE BORDER Dressing 4x4 (in/in) (Generic) 3 x Per Week/30 Days Discharge Instructions: Please do not put silicone bordered dressings under wraps. Use non-bordered dressing only. Electronic Signature(s) Signed: 03/23/2023 4:43:06 PM By: Midge Aver MSN RN CNS WTA Signed: 03/24/2023 7:34:23 AM By: Baltazar Najjar MD Entered By: Midge Aver on 03/23/2023 13:50:08 -------------------------------------------------------------------------------- Problem List Details Patient Name: Date of Service: [name] 03/23/2023 1:15 PM Medical Record Number: [id number] Patient Account Number: [id number] Date of Birth/Sex: Treating RN: 28-Jan-1951 (72 y.o. [name] Primary Care Provider: Kerby Nora Other Clinician: Referring Provider: Treating Provider/Extender: RO BSO N, MICHA EL Vernie Ammons, [name] in Treatment: 7 Active Problems ICD-10 Encounter Code  Description Active Date MDM Diagnosis E11.621 Type 2 diabetes mellitus with foot ulcer 01/31/2023 No Yes L97.512 Non-pressure chronic ulcer of other part of right foot with fat layer exposed 01/31/2023 No Yes TRELON, BLISH ([id number]) [id number] Page 4 of 6 I48.0 Paroxysmal atrial fibrillation 01/31/2023 No Yes Z79.01 Long term (current) use of anticoagulants 01/31/2023 No Yes I10 Essential (primary) hypertension 01/31/2023 No Yes I25.10 Atherosclerotic heart disease of native coronary artery without angina pectoris 01/31/2023 No Yes Inactive Problems Resolved Problems Electronic Signature(s) Signed: 03/24/2023 7:34:23 AM By: Baltazar Najjar MD Entered By: Baltazar Najjar on 03/23/2023 13:56:11 -------------------------------------------------------------------------------- Progress Note Details Patient Name: Date of Service: [name] 03/23/2023 1:15 PM Medical Record Number: [id number] Patient Account Number: [id number] Date of Birth/Sex: Treating RN: 1950-08-31 (72 y.o. [name] Primary Care Provider: Kerby Nora Other Clinician: Referring Provider: Treating Provider/Extender: RO BSO N, MICHA EL Vernie Ammons, [name] in Treatment: 7 Subjective History of Present Illness (HPI) 01-31-2023 upon evaluation today patient appears to be doing poorly currently in regard to wounds on his right heel, right dorsal foot, and right lateral foot. Subsequently he does have known peripheral vascular disease and again this is something that he needs to I think Checked formally. This is what the majority of the conversation today hinged around and the patient voiced understanding as far as that is concerned. Fortunately I do not see any signs of active infection locally nor systemically which is great news. Patient does have a history of diabetes mellitus type 2, atrial fibrillation for which she is on long-term anticoagulant Therapy, hypertension, and coronary  artery disease. Patient's hemoglobin A1c most recently was on  11-17-2022 and was 7.2 and currently he is on Eliquis and Plavix. 02-07-2023 upon evaluation today patient appears to be doing well currently in regard to his wounds all things considered I feel like we are still maintaining that does not seem like it is any worse is also not significantly better. I discussed with the patient that I do believe he would benefit from the arterial evaluation we still need to get this done as quickly as possible and subsequently we did put in a follow-up call with the vascular office today with regard to this. 02-14-2023 upon evaluation today patient appears to be doing a little better in regard to his wounds which are showing signs of loosening which is great news. With that being said he is experiencing an improvement overall in his symptoms and we are still waiting on the vascular evaluation want to get this result and consider whether or not his blood flow is good or not we will be able to make a better determination of next steps. For now we will try and avoid any aggressive sharp debridement to know that he has good arterial flow. 02-21-2023 upon evaluation today patient appears to be doing about the same in regard to his wound. Fortunately there does not appear to be any signs of active infection at this time which is great news. No fevers, chills, nausea, vomiting, or diarrhea. I did review patient's arterial study and it appears that he has pretty good flow in the left is not perfect but it is decent. On the right however he is definitely not doing nearly as good and I think that he is going to need to see one of the vascular doctors for further evaluation and treatment of this right leg in order to get these wounds to heal. 02-28-2023 upon evaluation today patient appears to be doing well currently in regard to his wound. He has been tolerating the dressing changes without complication. Fortunately I do not  see any evidence of active infection locally nor systemically at this time. I do think that the eschar started to soften up and I would like to try to get some of this off to see if we can get things moving in the right direction. He is in agreement with this plan. 03-09-2023 upon evaluation today patient appears to be doing well currently in regard to his wound. He has been tolerating the dressing changes without TYLEEK, WHILES ([id number]) [id number] Page 5 of 6 complication. Fortunately there does not appear to be any signs of active infection locally nor systemically at this time which is great news. I do believe clearing out some of the necrotic debris last week has helped to a degree. 03-16-2023 upon evaluation today patient appears to be doing well currently in regard to his wounds. He is going to be having a vascular angiogram in order to open up blood flow and I think this is going to be very beneficial for him. Fortunately I do not see any evidence of active infection locally nor systemically which is great news. 7/25; patient with predominantly ischemic wounds to on his dorsal foot and 1 on the right heel. He is going for his angiogram on Monday. He has been using the Iodoflex to the wound and changing this dressing himself. Objective Constitutional Sitting or standing Blood Pressure is within target range for patient.. Pulse regular and within target range for patient.Marland Kitchen Respirations regular, non-labored and within target range.. Temperature is normal and within the target range for  the patient.Marland Kitchen appears in no distress. Vitals Time Taken: 1:27 PM, Height: 66 in, Weight: 250 lbs, BMI: 40.3, Temperature: 97.7 F, Pulse: 73 bpm, Respiratory Rate: 18 breaths/min, Blood Pressure: 119/70 mmHg. General Notes: Wound exam; the patient has 2 wounds on the dorsal right foot punched-out not completely viable surface. One is just proximal to the right fourth toe and the other  further up the foot in line with the right second toe. He also has 2 areas on the right Achilles heel area. All of these wounds are in the same condition not a completely viable surface but not too bad. His foot is cool no palpable dorsalis pedis or posterior tibial pulse in fact I could not feel a popliteal pulse either Integumentary (Hair, Skin) Wound #1 status is Open. Original cause of wound was Gradually Appeared. The date acquired was: 12/10/2022. The wound has been in treatment 7 weeks. The wound is located on the Right Calcaneus. The wound measures 3.5cm length x 0.8cm width x 0.4cm depth; 2.199cm^2 area and 0.88cm^3 volume. There is Fat Layer (Subcutaneous Tissue) exposed. There is a medium amount of serosanguineous drainage noted. There is medium (34-66%) red granulation within the wound bed. There is no necrotic tissue within the wound bed. Wound #2 status is Open. Original cause of wound was Gradually Appeared. The date acquired was: 12/10/2022. The wound has been in treatment 7 weeks. The wound is located on the Right,Distal,Dorsal Foot. The wound measures 1cm length x 0.8cm width x 0.2cm depth; 0.628cm^2 area and 0.126cm^3 volume. There is Fat Layer (Subcutaneous Tissue) exposed. There is a medium amount of serosanguineous drainage noted. There is no granulation within the wound bed. There is no necrotic tissue within the wound bed. Wound #3 status is Open. Original cause of wound was Gradually Appeared. The date acquired was: 12/10/2022. The wound has been in treatment 7 weeks. The wound is located on the Right,Proximal,Dorsal Foot. The wound measures 1.7cm length x 1.8cm width x 0.3cm depth; 2.403cm^2 area and 0.721cm^3 volume. There is Fat Layer (Subcutaneous Tissue) exposed. There is a medium amount of serosanguineous drainage noted. There is no granulation within the wound bed. There is a large (67-100%) amount of necrotic tissue within the wound bed including Adherent  Slough. Assessment Active Problems ICD-10 Type 2 diabetes mellitus with foot ulcer Non-pressure chronic ulcer of other part of right foot with fat layer exposed Paroxysmal atrial fibrillation Long term (current) use of anticoagulants Essential (primary) hypertension Atherosclerotic heart disease of native coronary artery without angina pectoris Plan Follow-up Appointments: Return Appointment in 1 week. Bathing/ Shower/ Hygiene: Wash wounds with antibacterial soap and water. Anesthetic (Use 'Patient Medications' Section for Anesthetic Order Entry): Lidocaine applied to wound bed WOUND #1: - Calcaneus Wound Laterality: Right Cleanser: Byram Ancillary Kit - 15 Day Supply (Generic) 3 x Per Week/30 Days Discharge Instructions: Use supplies as instructed; Kit contains: (15) Saline Bullets; (15) 3x3 Gauze; 15 pr Gloves Cleanser: Soap and Water 3 x Per Week/30 Days Discharge Instructions: Gently cleanse wound with antibacterial soap, rinse and pat dry prior to dressing wounds Prim Dressing: IODOFLEX 0.9% Cadexomer Iodine Pad (Generic) 3 x Per Week/30 Days ary Discharge Instructions: Apply Iodoflex to wound bed only as directed. Secondary Dressing: (BORDER) Zetuvit Plus SILICONE BORDER Dressing 4x4 (in/in) (Generic) 3 x Per Week/30 Days Discharge Instructions: Please do not put silicone bordered dressings under wraps. Use non-bordered dressing only. WOUND #2: - Foot Wound Laterality: Dorsal, Right, Distal DAYTRON, GULAN ([id number]) [id number] Page 6 of 6  Cleanser: Byram Ancillary Kit - 15 Day Supply (Generic) 3 x Per Week/30 Days Discharge Instructions: Use supplies as instructed; Kit contains: (15) Saline Bullets; (15) 3x3 Gauze; 15 pr Gloves Cleanser: Soap and Water 3 x Per Week/30 Days Discharge Instructions: Gently cleanse wound with antibacterial soap, rinse and pat dry prior to dressing wounds Prim Dressing: IODOFLEX 0.9% Cadexomer Iodine Pad (Generic) 3 x  Per Week/30 Days ary Discharge Instructions: Apply Iodoflex to wound bed only as directed. Secondary Dressing: (BORDER) Zetuvit Plus SILICONE BORDER Dressing 4x4 (in/in) (Generic) 3 x Per Week/30 Days Discharge Instructions: Please do not put silicone bordered dressings under wraps. Use non-bordered dressing only. WOUND #3: - Foot Wound Laterality: Dorsal, Right, Proximal Cleanser: Soap and Water 3 x Per Week/30 Days Discharge Instructions: Gently cleanse wound with antibacterial soap, rinse and pat dry prior to dressing wounds Prim Dressing: IODOFLEX 0.9% Cadexomer Iodine Pad (Generic) 3 x Per Week/30 Days ary Discharge Instructions: Apply Iodoflex to wound bed only as directed. Secondary Dressing: (BORDER) Zetuvit Plus SILICONE BORDER Dressing 4x4 (in/in) (Generic) 3 x Per Week/30 Days Discharge Instructions: Please do not put silicone bordered dressings under wraps. Use non-bordered dressing only. 1. I continued the same dressing at this point. 2. The wounds may need mechanical debridement but I would like to have optimal revascularization before we attempt to do this. Electronic Signature(s) Signed: 03/24/2023 7:34:23 AM By: Baltazar Najjar MD Entered By: Baltazar Najjar on 03/23/2023 13:59:44 -------------------------------------------------------------------------------- SuperBill Details Patient Name: Date of Service: [name] 03/23/2023 Medical Record Number: [id number] Patient Account Number: [id number] Date of Birth/Sex: Treating RN: Dec 27, 1950 (72 y.o. [name] Primary Care Provider: Kerby Nora Other Clinician: Referring Provider: Treating Provider/Extender: RO BSO N, MICHA EL Vernie Ammons, [name] in Treatment: 7 Diagnosis Coding ICD-10 Codes Code Description E11.621 Type 2 diabetes mellitus with foot ulcer L97.512 Non-pressure chronic ulcer of other part of right foot with fat layer exposed I48.0 Paroxysmal atrial fibrillation Z79.01 Long term (current)  use of anticoagulants I10 Essential (primary) hypertension I25.10 Atherosclerotic heart disease of native coronary artery without angina pectoris Physician Procedures : CPT4 Code Description Modifier [id number] - WC PHYS LEVEL 3 - EST PT ICD-10 Diagnosis Description L97.512 Non-pressure chronic ulcer of other part of right foot with fat layer exposed E11.621 Type 2 diabetes mellitus with foot ulcer Quantity: 1 Electronic Signature(s) Signed: 03/24/2023 7:34:23 AM By: Baltazar Najjar MD Entered By: Baltazar Najjar on 03/23/2023 14:00:09

## 2023-03-27 ENCOUNTER — Encounter: Payer: Self-pay | Admitting: Certified Registered"

## 2023-03-27 ENCOUNTER — Encounter
Admission: RE | Disposition: A | Discharge status: Home / Self Care | Payer: Self-pay | Source: Home / Self Care | Attending: Vascular Surgery

## 2023-03-27 ENCOUNTER — Other Ambulatory Visit: Payer: Self-pay

## 2023-03-27 ENCOUNTER — Encounter: Payer: Self-pay | Admitting: Vascular Surgery

## 2023-03-27 ENCOUNTER — Ambulatory Visit
Admission: RE | Admit: 2023-03-27 | Discharge: 2023-03-27 | Disposition: A | Discharge status: Home / Self Care | Payer: PPO | Attending: Vascular Surgery | Admitting: Vascular Surgery

## 2023-03-27 DIAGNOSIS — L97512 Non-pressure chronic ulcer of other part of right foot with fat layer exposed: Secondary | ICD-10-CM | POA: Diagnosis not present

## 2023-03-27 DIAGNOSIS — I70238 Atherosclerosis of native arteries of right leg with ulceration of other part of lower right leg: Secondary | ICD-10-CM | POA: Diagnosis not present

## 2023-03-27 DIAGNOSIS — Z794 Long term (current) use of insulin: Secondary | ICD-10-CM | POA: Diagnosis not present

## 2023-03-27 DIAGNOSIS — I70235 Atherosclerosis of native arteries of right leg with ulceration of other part of foot: Secondary | ICD-10-CM | POA: Diagnosis not present

## 2023-03-27 DIAGNOSIS — E1151 Type 2 diabetes mellitus with diabetic peripheral angiopathy without gangrene: Secondary | ICD-10-CM | POA: Insufficient documentation

## 2023-03-27 DIAGNOSIS — L97909 Non-pressure chronic ulcer of unspecified part of unspecified lower leg with unspecified severity: Secondary | ICD-10-CM

## 2023-03-27 DIAGNOSIS — E11319 Type 2 diabetes mellitus with unspecified diabetic retinopathy without macular edema: Secondary | ICD-10-CM | POA: Diagnosis not present

## 2023-03-27 DIAGNOSIS — L97519 Non-pressure chronic ulcer of other part of right foot with unspecified severity: Secondary | ICD-10-CM

## 2023-03-27 DIAGNOSIS — I110 Hypertensive heart disease with heart failure: Secondary | ICD-10-CM | POA: Insufficient documentation

## 2023-03-27 DIAGNOSIS — L97919 Non-pressure chronic ulcer of unspecified part of right lower leg with unspecified severity: Secondary | ICD-10-CM | POA: Diagnosis not present

## 2023-03-27 DIAGNOSIS — I509 Heart failure, unspecified: Secondary | ICD-10-CM | POA: Insufficient documentation

## 2023-03-27 DIAGNOSIS — Z7985 Long-term (current) use of injectable non-insulin antidiabetic drugs: Secondary | ICD-10-CM | POA: Diagnosis not present

## 2023-03-27 DIAGNOSIS — I701 Atherosclerosis of renal artery: Secondary | ICD-10-CM

## 2023-03-27 HISTORY — PX: LOWER EXTREMITY ANGIOGRAPHY: CATH118251

## 2023-03-27 HISTORY — DX: Unspecified macular degeneration: H35.30

## 2023-03-27 LAB — CREATININE, SERUM
Creatinine, Ser: 0.99 mg/dL (ref 0.61–1.24)
GFR, Estimated: >60 mL/min (ref >60)

## 2023-03-27 LAB — GLUCOSE, CAPILLARY
Glucose-Capillary: 220 mg/dL — ABNORMAL HIGH (ref 70–99)
Glucose-Capillary: 148 mg/dL — ABNORMAL HIGH (ref 70–99)
Glucose-Capillary: 126 mg/dL — ABNORMAL HIGH (ref 70–99)

## 2023-03-27 LAB — BUN: BUN: 26 mg/dL — ABNORMAL HIGH (ref 8–23)

## 2023-03-27 SURGERY — LOWER EXTREMITY ANGIOGRAPHY
Anesthesia: Moderate Sedation | Site: Leg Lower | Laterality: Right

## 2023-03-27 MED ORDER — FENTANYL CITRATE (PF) 100 MCG/2ML IJ SOLN
INTRAMUSCULAR | Status: DC | PRN
  Administered 2023-03-27: 50 ug via INTRAVENOUS
  Administered 2023-03-27: 25 ug via INTRAVENOUS

## 2023-03-27 MED ORDER — APIXABAN 5 MG PO TABS: 5.0000 mg | ORAL_TABLET | Freq: Two times a day (BID) | ORAL | 0 refills | Status: DC

## 2023-03-27 MED ORDER — MIDAZOLAM HCL 5 MG/5ML IJ SOLN
INTRAMUSCULAR | Status: AC
  Filled 2023-03-27: qty 5

## 2023-03-27 MED ORDER — MIDAZOLAM HCL 2 MG/2ML IJ SOLN
INTRAMUSCULAR | Status: DC | PRN
  Administered 2023-03-27: 1 mg via INTRAVENOUS
  Administered 2023-03-27: 2 mg via INTRAVENOUS

## 2023-03-27 MED ORDER — FAMOTIDINE 20 MG PO TABS: 40.0000 mg | ORAL_TABLET | Freq: Once | ORAL | Status: DC | PRN

## 2023-03-27 MED ORDER — METHYLPREDNISOLONE SODIUM SUCC 125 MG IJ SOLR: 125.0000 mg | Freq: Once | INTRAMUSCULAR | Status: DC | PRN

## 2023-03-27 MED ORDER — SODIUM CHLORIDE 0.9 % IV SOLN: INTRAVENOUS | Status: DC

## 2023-03-27 MED ORDER — FENTANYL CITRATE (PF) 100 MCG/2ML IJ SOLN
INTRAMUSCULAR | Status: AC
  Filled 2023-03-27: qty 2

## 2023-03-27 MED ORDER — HYDROMORPHONE HCL 1 MG/ML IJ SOLN: 1.0000 mg | Freq: Once | INTRAMUSCULAR | Status: DC | PRN

## 2023-03-27 MED ORDER — ONDANSETRON HCL 4 MG/2ML IJ SOLN: 4.0000 mg | Freq: Four times a day (QID) | INTRAMUSCULAR | Status: DC | PRN

## 2023-03-27 MED ORDER — LIDOCAINE-EPINEPHRINE (PF) 1 %-1:200000 IJ SOLN
INTRAMUSCULAR | Status: DC | PRN
  Administered 2023-03-27: 10 mL

## 2023-03-27 MED ORDER — HYDRALAZINE HCL 20 MG/ML IJ SOLN: 5.0000 mg | INTRAMUSCULAR | Status: DC | PRN

## 2023-03-27 MED ORDER — ACETAMINOPHEN 325 MG PO TABS: 650.0000 mg | ORAL_TABLET | ORAL | Status: DC | PRN

## 2023-03-27 MED ORDER — SODIUM CHLORIDE 0.9 % IV SOLN: 250.0000 mL | INTRAVENOUS | Status: DC | PRN

## 2023-03-27 MED ORDER — CEFAZOLIN SODIUM-DEXTROSE 2-4 GM/100ML-% IV SOLN
INTRAVENOUS | Status: AC
  Filled 2023-03-27: qty 100

## 2023-03-27 MED ORDER — DIPHENHYDRAMINE HCL 50 MG/ML IJ SOLN: 50.0000 mg | Freq: Once | INTRAMUSCULAR | Status: DC | PRN

## 2023-03-27 MED ORDER — CEFAZOLIN SODIUM-DEXTROSE 2-4 GM/100ML-% IV SOLN
2.0000 g | INTRAVENOUS | Status: AC
  Administered 2023-03-27: 2 g via INTRAVENOUS

## 2023-03-27 MED ORDER — IODIXANOL 320 MG/ML IV SOLN
INTRAVENOUS | Status: DC | PRN
  Administered 2023-03-27: 65 mL

## 2023-03-27 MED ORDER — SODIUM CHLORIDE 0.9% FLUSH: 3.0000 mL | Freq: Two times a day (BID) | INTRAVENOUS | Status: DC

## 2023-03-27 MED ORDER — LABETALOL HCL 5 MG/ML IV SOLN: 10.0000 mg | INTRAVENOUS | Status: DC | PRN

## 2023-03-27 MED ORDER — HEPARIN SODIUM (PORCINE) 1000 UNIT/ML IJ SOLN
INTRAMUSCULAR | Status: DC | PRN
  Administered 2023-03-27: 6000 [IU] via INTRAVENOUS

## 2023-03-27 MED ORDER — HEPARIN (PORCINE) IN NACL 1000-0.9 UT/500ML-% IV SOLN
INTRAVENOUS | Status: DC | PRN
  Administered 2023-03-27: 1000 mL

## 2023-03-27 MED ORDER — HEPARIN SODIUM (PORCINE) 1000 UNIT/ML IJ SOLN
INTRAMUSCULAR | Status: AC
  Filled 2023-03-27: qty 10

## 2023-03-27 MED ORDER — SODIUM CHLORIDE 0.9% FLUSH: 3.0000 mL | INTRAVENOUS | Status: DC | PRN

## 2023-03-27 MED ORDER — MIDAZOLAM HCL 2 MG/ML PO SYRP: 8.0000 mg | ORAL_SOLUTION | Freq: Once | ORAL | Status: DC | PRN

## 2023-03-27 SURGICAL SUPPLY — 21 items
BALLN LUTONIX 018 4X80X130 (BALLOONS) ×1
BALLN LUTONIX 018 5X80X130 (BALLOONS) ×1
BALLN ULTRVRSE 2.5X300X150 (BALLOONS) ×1
BALLOON LUTONIX 018 4X80X130 (BALLOONS) IMPLANT
BALLOON LUTONIX 018 5X80X130 (BALLOONS) IMPLANT
BALLOON ULTRVRSE 2.5X300X150 (BALLOONS) IMPLANT
CATH ANGIO 5F PIGTAIL 65CM (CATHETERS) IMPLANT
CATH NAVICROSS ANGLED 135CM (MICROCATHETER) IMPLANT
CATH VERT 5X100 (CATHETERS) IMPLANT
COVER EZ STRL 42X30 (DRAPES) IMPLANT
DEVICE STARCLOSE SE CLOSURE (Vascular Products) IMPLANT
DRAPE FEMORAL ANGIO W/ POUCH (DRAPES) IMPLANT
GLIDEWIRE ADV .035X260CM (WIRE) IMPLANT
GUIDEWIRE PFTE-COATED .018X300 (WIRE) IMPLANT
KIT ENCORE 26 ADVANTAGE (KITS) IMPLANT
PACK ANGIOGRAPHY (CUSTOM PROCEDURE TRAY) ×1 IMPLANT
SHEATH BRITE TIP 5FRX11 (SHEATH) IMPLANT
SHEATH RAABE 6FRX70 (SHEATH) IMPLANT
SYR MEDRAD MARK 7 150ML (SYRINGE) IMPLANT
TUBING CONTRAST HIGH PRESS 72 (TUBING) IMPLANT
WIRE GUIDERIGHT .035X150 (WIRE) IMPLANT

## 2023-03-27 NOTE — Telephone Encounter (Signed)
Called and spoke with daughter Clair Gulling. Informed her of the following from Dr. Mariah Milling.  Perhaps he can work with pharmacy for options  Could consider Xarelto with Xarelto patient assistance  If unable to qualify with Eliquis or Xarelto may need to change to warfarin  Thx  TG   Patient Assistance Forms left in office for daughter to pick up.

## 2023-03-27 NOTE — Interval H&P Note (Signed)
History and Physical Interval Note:  03/27/2023 10:27 AM  [name]  has presented today for surgery, with the diagnosis of RLE Angio    ASO w ulceration.  The various methods of treatment have been discussed with the patient and family. After consideration of risks, benefits and other options for treatment, the patient has consented to  Procedure(s): Lower Extremity Angiography (Right) as a surgical intervention.  [name] history has been reviewed, patient examined, no change in status, stable for surgery.  I have reviewed the patient's chart and labs.  Questions were answered to the patient's satisfaction.     Festus Barren

## 2023-03-27 NOTE — Op Note (Signed)
[facility]  Percutaneous Study/Intervention Procedural Note   Date of Surgery: 03/27/2023  Surgeon(s):Marra Fraga    Assistants:none  Pre-operative Diagnosis: PAD with ulceration RLE  Post-operative diagnosis:  Same  Procedure(s) Performed:             1.  Ultrasound guidance for vascular access left femoral artery             2.  Catheter placement into right SFA from left femoral approach             3.  Aortogram and selective right lower extremity angiogram             4.  Percutaneous transluminal angioplasty of right posterior tibial artery with 2.5 mm diameter by 30 cm length angioplasty balloon             5.  Percutaneous transluminal angioplasty of the right popliteal artery with 4 and 5 mm diameter Lutonix drug-coated angioplasty balloon  6.  StarClose closure device left femoral artery  EBL: 10 cc  Contrast: 65 cc  Fluoro Time: 5.6 minutes  Moderate Conscious Sedation Time: approximately 42 minutes using 3 mg of Versed and 75 mcg of Fentanyl              Indications:  Patient is a 72 y.o.male with nonhealing ulcerations of the right foot and lower leg. The patient has noninvasive study showing reduced perfusion on the right. The patient is brought in for angiography for further evaluation and potential treatment.  Due to the limb threatening nature of the situation, angiogram was performed for attempted limb salvage. The patient is aware that if the procedure fails, amputation would be expected.  The patient also understands that even with successful revascularization, amputation may still be required due to the severity of the situation.  Risks and benefits are discussed and informed consent is obtained.   Procedure:  The patient was identified and appropriate procedural time out was performed.  The patient was then placed supine on the table and prepped and draped in the usual sterile fashion. Moderate conscious sedation was administered during a  face to face encounter with the patient throughout the procedure with my supervision of the RN administering medicines and monitoring the patient's vital signs, pulse oximetry, telemetry and mental status throughout from the start of the procedure until the patient was taken to the recovery room. Ultrasound was used to evaluate the left common femoral artery.  It was patent .  A digital ultrasound image was acquired.  A Seldinger needle was used to access the left common femoral artery under direct ultrasound guidance and a permanent image was performed.  A 0.035 J wire was advanced without resistance and a 5Fr sheath was placed.  Pigtail catheter was placed into the aorta and an AP aortogram was performed. This demonstrated normal left renal artery, moderate stenosis in the right renal artery likely in the 60 to 70% range, and normal aorta and iliac segments without significant stenosis. I then crossed the aortic bifurcation and advanced to the right femoral head and then into the right SFA to help opacify distally due to slow circulation time. Selective right lower extremity angiogram was then performed. This demonstrated fairly normal common femoral artery, profunda femoris artery, and superficial femoral artery.  The popliteal artery below the knee had a string sign stenosis in the 90 to 95% range.  The tibioperoneal trunk normalized and then there was diffuse disease throughout the tibial vessels.  The  peroneal and anterior tibial arteries were chronically occluded without distal reconstitution.  The posterior tibial artery had a long segment occlusion in the proximal to mid segment with reconstitution in the mid to distal posterior tibial artery and some disease in the posterior tibial artery at the ankle and the foot in the moderate range. It was felt that it was in the patient's best interest to proceed with intervention after these images to avoid a second procedure and a larger amount of contrast and  fluoroscopy based off of the findings from the initial angiogram. The patient was systemically heparinized and a 6 [place] Ansell sheath was then placed over the [employer] wire. I then used a Kumpe catheter and the 0.018 advantage wire to cross the popliteal stenosis and get down into the posterior tibial artery.  I then exchanged for a Marshia Ly cross catheter and was able to cross the long segment occlusion in the posterior tibial artery parking the wire in the foot.  Proceeded with treatment.  A 2.5 mm diameter by 30 cm length angioplasty balloon was inflated all the way down into the proximal foot and ankle up to the mid posterior tibial artery up to 10 atm for 1 minute.  Was then pulled back for the proximal posterior tibial artery and tibioperoneal trunk area and inflated to 12 atm for 1 minute.  Completion imaging following this showed marked improvement with less than 30% residual stenosis and now with patency of the posterior tibial artery.  I then turned my attention to the popliteal artery.  I initially placed a 5 mm diameter by 6 cm length Lutonix drug-coated angioplasty balloon but it did not easily cross the stenosis.  It was inflated at the start of the stenosis and then a 4 mm diameter by 8 cm length Lutonix drug-coated angioplasty balloon was inflated to 12 atm for 1 minute in the popliteal artery.  Completion imaging showed marked improvement with only about a 15 to 20% residual stenosis in the popliteal artery. I elected to terminate the procedure. The sheath was removed and StarClose closure device was deployed in the lefty femoral artery with excellent hemostatic result. The patient was taken to the recovery room in stable condition having tolerated the procedure well.  Findings:               Aortogram:  This demonstrated normal left renal artery, moderate stenosis in the right renal artery likely in the 60 to 70% range, and normal aorta and iliac segments without significant stenosis.              Right lower Extremity:  This demonstrated fairly normal common femoral artery, profunda femoris artery, and superficial femoral artery.  The popliteal artery below the knee had a string sign stenosis in the 90 to 95% range.  The tibioperoneal trunk normalized and then there was diffuse disease throughout the tibial vessels.  The peroneal and anterior tibial arteries were chronically occluded without distal reconstitution.  The posterior tibial artery had a long segment occlusion in the proximal to mid segment with reconstitution in the mid to distal posterior tibial artery and some disease in the posterior tibial artery at the ankle and the foot in the moderate range.   Disposition: Patient was taken to the recovery room in stable condition having tolerated the procedure well.  Complications: None  Festus Barren 03/27/2023 12:22 PM   This note was created with Dragon Medical transcription system. Any errors in dictation are purely unintentional.

## 2023-03-30 ENCOUNTER — Ambulatory Visit: Payer: PPO | Admitting: Physician Assistant

## 2023-04-03 ENCOUNTER — Ambulatory Visit: Payer: PPO | Attending: Family | Admitting: Family

## 2023-04-03 ENCOUNTER — Encounter: Payer: Self-pay | Admitting: Family

## 2023-04-03 VITALS — BP 126/66 | HR 79 | Ht 66.0 in | Wt 255.0 lb

## 2023-04-03 DIAGNOSIS — E1151 Type 2 diabetes mellitus with diabetic peripheral angiopathy without gangrene: Secondary | ICD-10-CM | POA: Insufficient documentation

## 2023-04-03 DIAGNOSIS — Z794 Long term (current) use of insulin: Secondary | ICD-10-CM | POA: Insufficient documentation

## 2023-04-03 DIAGNOSIS — E1122 Type 2 diabetes mellitus with diabetic chronic kidney disease: Secondary | ICD-10-CM | POA: Diagnosis not present

## 2023-04-03 DIAGNOSIS — E11319 Type 2 diabetes mellitus with unspecified diabetic retinopathy without macular edema: Secondary | ICD-10-CM | POA: Diagnosis not present

## 2023-04-03 DIAGNOSIS — I739 Peripheral vascular disease, unspecified: Secondary | ICD-10-CM | POA: Diagnosis not present

## 2023-04-03 DIAGNOSIS — Z7984 Long term (current) use of oral hypoglycemic drugs: Secondary | ICD-10-CM | POA: Insufficient documentation

## 2023-04-03 DIAGNOSIS — I130 Hypertensive heart and chronic kidney disease with heart failure and stage 1 through stage 4 chronic kidney disease, or unspecified chronic kidney disease: Secondary | ICD-10-CM | POA: Diagnosis not present

## 2023-04-03 DIAGNOSIS — I480 Paroxysmal atrial fibrillation: Secondary | ICD-10-CM | POA: Insufficient documentation

## 2023-04-03 DIAGNOSIS — E785 Hyperlipidemia, unspecified: Secondary | ICD-10-CM | POA: Diagnosis not present

## 2023-04-03 DIAGNOSIS — Z79899 Other long term (current) drug therapy: Secondary | ICD-10-CM | POA: Insufficient documentation

## 2023-04-03 DIAGNOSIS — I5022 Chronic systolic (congestive) heart failure: Secondary | ICD-10-CM | POA: Insufficient documentation

## 2023-04-03 DIAGNOSIS — I10 Essential (primary) hypertension: Secondary | ICD-10-CM

## 2023-04-03 DIAGNOSIS — Z7902 Long term (current) use of antithrombotics/antiplatelets: Secondary | ICD-10-CM | POA: Diagnosis not present

## 2023-04-03 DIAGNOSIS — I252 Old myocardial infarction: Secondary | ICD-10-CM | POA: Diagnosis not present

## 2023-04-03 DIAGNOSIS — Z955 Presence of coronary angioplasty implant and graft: Secondary | ICD-10-CM | POA: Diagnosis not present

## 2023-04-03 DIAGNOSIS — Z95820 Peripheral vascular angioplasty status with implants and grafts: Secondary | ICD-10-CM | POA: Diagnosis not present

## 2023-04-03 DIAGNOSIS — F32A Depression, unspecified: Secondary | ICD-10-CM | POA: Diagnosis not present

## 2023-04-03 DIAGNOSIS — I2582 Chronic total occlusion of coronary artery: Secondary | ICD-10-CM | POA: Insufficient documentation

## 2023-04-03 DIAGNOSIS — I2510 Atherosclerotic heart disease of native coronary artery without angina pectoris: Secondary | ICD-10-CM | POA: Diagnosis not present

## 2023-04-03 DIAGNOSIS — Z8616 Personal history of COVID-19: Secondary | ICD-10-CM | POA: Diagnosis not present

## 2023-04-03 DIAGNOSIS — Z7985 Long-term (current) use of injectable non-insulin antidiabetic drugs: Secondary | ICD-10-CM | POA: Diagnosis not present

## 2023-04-03 DIAGNOSIS — N189 Chronic kidney disease, unspecified: Secondary | ICD-10-CM | POA: Insufficient documentation

## 2023-04-03 DIAGNOSIS — I255 Ischemic cardiomyopathy: Secondary | ICD-10-CM | POA: Diagnosis not present

## 2023-04-03 NOTE — Progress Notes (Signed)
PCP: [name], MD (last seen 06/24) Primary Cardiologist: Julien Nordmann, MD (last seen 06/24)  HPI:  [name] is a 72 y/o male with a history of NSTEMI 04/17 (PCI to LAD; was discharged on Plavix) and again 02/2016 (no PCI; was discharged on Brilinta), Again presented to [facility] ER on 04/05/16 for NSTEMI (no PCI), chronic total occlusion of RCA, DM, hyperlipidemia, HTN, CKD, depression and chronic heart failure. Due to paroxysmal atrial fibrillation, was successfully cardioverted 03/03/23.   Echo 11/06/22: EF 40-45% along with mild/moderate William  LHC 04/06/16:  Mid RCA to Dist RCA lesion, 100 %stenosed. Mid LAD-2 lesion, 60 %stenosed. Mid LAD-1 lesion, 20 %stenosed. The left ventricular systolic function is normal. The left ventricular ejection fraction is 50-55% by visual estimate. LV end diastolic pressure is normal. 2nd [name] lesion, 75 %stenosed.  Chronic total occlusion of the right coronary in the proximal segment well collateralized from the left circumflex and LAD. Widely patent proximal to mid LAD stent previously placed in July. There is first diagonal diagonal and LAD 30 and 50% narrowing respectively. Widely patent circumflex unchanged from previous with 70% narrowing in a small branch of the first marginal. Circumflex collateralizes the distal right coronary left ventricular branch. Inferobasal hypokinesis. EF 50%. EDP is normal.  Admitted 11/05/22 due to nausea, vomiting and dyspnea for the last 3 days. Noted to have fever and tested covid +. CTA negative for PE but showed heart failure. 1 dose of IV lasix given. Started on remdesivir, Decadron and bronchodilators.   He presents today for a HF f/u visit with a chief complaint of minimal fatigue with moderate exertion. Chronic in nature although much improved. Has associated pain in his right foot and wound on his right foot. Does endorse that prior to his aortogram last month, he wasn't having any pain in the foot. & understands that his  pain is now because he has good blood flow in that leg. He can now walk without his cane and is overall feeling better. Denies any SOB, chest pain, palpitations, abdominal distention, pedal edema, dizziness or difficulty sleeping.    Cardioverted 03/03/23. Had right lower leg aortogram and angioplasty of right posterior tibial artery and right popliteal artery on 03/27/23.  Participating in paramedicine program and was last seen by her 07/24.   ROS: All systems negative except as listed in HPI, PMH and Problem List.  SH:  Social History   Socioeconomic History   Marital status: Widowed    Spouse name: Not on file   Number of children: 3   Years of education: Not on file   Highest education level: 7th grade  Occupational History   Occupation: disabled    Associate Professor: UNEMPLOYED    Comment: back injury  Tobacco Use   Smoking status: Never   Smokeless tobacco: Never  Vaping Use   Vaping status: Never Used  Substance and Sexual Activity   Alcohol use: No    Alcohol/week: 0.0 standard drinks of alcohol   Drug use: No   Sexual activity: Not Currently  Other Topics Concern   Not on file  Social History Narrative   Has a roommate, William. [name]. No pets.   Social Determinants of Health   Financial Resource Strain: Low Risk  (11/23/2022)   Overall Financial Resource Strain (CARDIA)    Difficulty of Paying Living Expenses: Not hard at all  Food Insecurity: No Food Insecurity (12/29/2022)   Hunger Vital Sign    Worried About Programme researcher, broadcasting/film/video in  the Last Year: Never true    Ran Out of Food in the Last Year: Never true  Transportation Needs: No Transportation Needs (12/29/2022)   PRAPARE - Administrator, Civil Service (Medical): No    Lack of Transportation (Non-Medical): No  Physical Activity: Inactive (11/09/2022)   Exercise Vital Sign    Days of Exercise per Week: 0 days    Minutes of Exercise per Session: 0 min  Stress: No Stress Concern Present (11/09/2022)   [employer] of Occupational Health - Occupational Stress Questionnaire    Feeling of Stress : Only a little  Social Connections: Moderately Isolated (11/09/2022)   Social Connection and Isolation Panel [NHANES]    Frequency of Communication with Friends and Family: More than three times a week    Frequency of Social Gatherings with Friends and Family: More than three times a week    Attends Religious Services: More than 4 times per year    Active Member of [employer] or Organizations: No    Attends Banker Meetings: Never    Marital Status: Widowed  Intimate Partner Violence: Not At Risk (11/09/2022)   Humiliation, Afraid, Rape, and Kick questionnaire    Fear of Current or Ex-Partner: No    Emotionally Abused: No    Physically Abused: No    Sexually Abused: No    FH:  Family History  Problem Relation Age of Onset   Alzheimer's disease Mother    Emphysema Mother    Diabetes Father    Heart disease Father        MI   Cancer Brother        ? Neck cancer    Past Medical History:  Diagnosis Date   Back injury 02/2002   worker's comp   CHF (congestive heart failure) (HCC)    Coronary artery disease, non-occlusive    a. cath 2002 with no sig CAD;  b. cath 2008 normal LM, LAD, LCx, p&dRCA 20-30%, PDA 30%; c.11/2015 NSTEMI/PCI: LM nl, LAD 95p (2.5x15 Xience DES), LCX nl, RCA 100p/m w/ L->R collats, EF 55-65% c. NSTEMI (02/2016) with no culprit leision, switched to Brilinta.  d. NSTEMI 03/2016: again, no culprit lesion and switched back to plavix 2/2 SOB with Brilnta.     Depression    Diabetes mellitus type 2, insulin dependent (HCC)    Hyperlipemia    Hypertension    Hypertensive heart disease    Kidney stones    Macular degeneration    Morbid obesity (HCC)    Osteoarthritis    Snoring     Current Outpatient Medications  Medication Sig Dispense Refill   Acetaminophen (TYLENOL PO) Take 3-4 tablets by mouth as needed (pain).     apixaban (ELIQUIS) 5 MG TABS tablet Take 1  tablet (5 mg total) by mouth 2 (two) times daily. 56 tablet 0   carvedilol (COREG) 12.5 MG tablet Take 1 tablet (12.5 mg total) by mouth 2 (two) times daily with a meal. 180 tablet 3   cholecalciferol (VITAMIN D3) 25 MCG (1000 UNIT) tablet Take 2,000 Units by mouth daily.     clopidogrel (PLAVIX) 75 MG tablet TAKE ONE TABLET BY MOUTH ONCE DAILY WITH BREAKFAST 90 tablet 3   Continuous Blood Gluc Sensor (FREESTYLE LIBRE 2 SENSOR) MISC APPLY SENSOR EVERY 14 DAYS TO MONITOR SUGAR CONTINOUSLY 2 each 11   Continuous Glucose Receiver (FREESTYLE LIBRE 2 READER) DEVI USE WITH SENSORS TO MONITOR SUGAR CONTINUOUSLY 1 each 0   Dulaglutide (TRULICITY)  4.5 MG/0.5ML SOPN Inject 4.5 mg into the skin once a week. Via [employer] PAP     empagliflozin (JARDIANCE) 10 MG TABS tablet Take 1 tablet (10 mg total) by mouth daily. 30 tablet 5   ENTRESTO 24-26 MG TAKE ONE TABLET BY MOUTH TWO (TWO) TIMES DAILY. 60 tablet 3   ezetimibe (ZETIA) 10 MG tablet TAKE ONE TABLET BY MOUTH ONCE DAILY 30 tablet 2   furosemide (LASIX) 40 MG tablet TAKE ONE TABLET BY MOUTH ONCE A DAY CAN TAKE A 2ND DAILY DOSE AS NEEDED. 180 tablet 1   guaiFENesin-dextromethorphan (ROBITUSSIN DM) 100-10 MG/5ML syrup Take 5 mLs by mouth every 4 (four) hours as needed for cough. (Patient not taking: Reported on 03/27/2023) 118 mL 0   HYDROcodone-acetaminophen (NORCO) 5-325 MG tablet Take 1-2 tablets by mouth daily as needed for moderate pain. 60 tablet 0   HYDROcodone-acetaminophen (NORCO/VICODIN) 5-325 MG tablet TAKE ONE (1) TO TWO (2) TABLETS BY MOUTH DAILY AS NEEDED FOR MODERATE PAIN. MAY (Patient not taking: Reported on 03/27/2023) 60 tablet 0   hydrOXYzine (ATARAX) 10 MG tablet TAKE 1 TABLET BY MOUTH 3 TIMES DAILY AS NEEDED FOR ANXIETY (Patient taking differently: Take 10 mg by mouth daily.) 30 tablet 2   insulin aspart (NOVOLOG FLEXPEN) 100 UNIT/ML FlexPen 13 units in AM (scheduled) and 3 units PRN in evening (Patient taking differently: Inject 3-13 Units  into the skin in the morning and at bedtime. Inject 13 units in the morning and 3 units at night) 15 mL 6   isosorbide mononitrate (IMDUR) 30 MG 24 hr tablet Take 1 tablet (30 mg total) by mouth 2 (two) times daily. 60 tablet 2   ketoconazole (NIZORAL) 2 % cream Apply 1 Application topically daily. 15 g 0   nitroGLYCERIN (NITROSTAT) 0.4 MG SL tablet DISSOLVE 1 TABLET UNDER TONGUE AS NEEDEDFOR CHEST PAIN. MAY REPEAT 5 MINUTES APART 3 TIMES IF NEEDED 25 tablet 3   spironolactone (ALDACTONE) 25 MG tablet Take 1 tablet (25 mg total) by mouth daily. 30 tablet 5   TRESIBA FLEXTOUCH 100 UNIT/ML FlexTouch Pen INJECT 50 UNITS INTO THE SKIN DAILY 15 mL 2   triamcinolone cream (KENALOG) 0.5 % APPLY ONE APPLICATION TOPICALLY TWO (TWO) TIMES DAILY. (Patient not taking: Reported on 03/27/2023) 30 g 0   TRUEPLUS 5-BEVEL PEN NEEDLES 31G X 6 MM MISC USE TO INJECT INSULIN THREE TIMES A DAY 300 each 3   venlafaxine XR (EFFEXOR-XR) 150 MG 24 hr capsule TAKE 1 CAPSULE BY MOUTH DAILY WITH BREAKFAST. TAKE WITH EFFEXOR XR 75MG  FORA TOTAL OF 225MG  (Patient taking differently: Take 150 mg by mouth daily with breakfast. Take with 75 mg) 90 capsule 0   vitamin B-12 (CYANOCOBALAMIN) 1000 MCG tablet Take 1,000 mcg by mouth daily.     No current facility-administered medications for this visit.   Vitals:   04/03/23 1454  BP: 126/66  Pulse: 79  SpO2: 100%  Weight: 255 lb (115.7 kg)  Height: 5\' 6"  (1.676 m)   Wt Readings from Last 3 Encounters:  04/03/23 255 lb (115.7 kg)  03/27/23 248 lb (112.5 kg)  03/10/23 252 lb (114.3 kg)   Lab Results  Component Value Date   CREATININE 0.99 03/27/2023   CREATININE 1.01 02/28/2023   CREATININE 0.98 01/30/2023   PHYSICAL EXAM:  General:  Well appearing. No resp difficulty HEENT: normal Neck: supple. JVP flat. No lymphadenopathy or thryomegaly appreciated. Cor: PMI normal. Regular rate, regular rhythm. No rubs, gallops or murmurs. Lungs: clear Abdomen: soft,  nontender,  nondistended. No hepatosplenomegaly. No bruits or masses.  Extremities: no cyanosis, clubbing, rash, trace pitting edema right lower leg Neuro: alert & oriented x3, cranial nerves grossly intact. Moves all 4 extremities w/o difficulty. Affect pleasant.   ECG: not done   ASSESSMENT & PLAN:  1: Ischemic cardiomyopathy with reduced ejection fraction- - NYHA class II - euvolemic today - weighing daily; reminded to call for an overnight weight gain of > 2 pounds or a weekly weight gain of > 5 pounds - weight up 2 pounds from last visit here 2 months ago - echo 11/06/22: EF 40-45% along with mild/moderate William - NAS but does like to eat out at [employer]; eating out less often now than he used to and we reviewed how to make better sodium choices when he eats out - discussed keeping his daily fluid intake to 60-64 oz - continue carvedilol 6.25mg  BID - continue jardiance 10mg  daily - continue furosemide 40mg  daily - continue entresto 24/26mg  BID; consider titrating in the future although BP does fluctuate quite a bit - continue spironolactone 25mg  daily - participating in paramedicine program; last seen 07/24 - more active at home now and reports overall feeling better - BNP 11/05/22 was 1240.5  2: HTN- - BP 126/66 - saw PCP Ermalene Searing) 06/24 - BMP 02/28/23 showed sodium 138, potassium 4.2, creatinine 1.01 & GFR >60  3: Diabetes- - A1c 02/22/23 was 6.6%  4: CAD- - saw cardiology Mariah Milling) 06/24 - LHC 04/06/16:  Mid RCA to Dist RCA lesion, 100 %stenosed. Mid LAD-2 lesion, 60 %stenosed. Mid LAD-1 lesion, 20 %stenosed. The left ventricular systolic function is normal. The left ventricular ejection fraction is 50-55% by visual estimate. LV end diastolic pressure is normal. 2nd [name] lesion, 75 %stenosed.  Chronic total occlusion of the right coronary in the proximal segment well collateralized from the left circumflex and LAD.  5: Atrial fibrillation- - cardioverted 03/03/23 - to see cardiology  in 2 days so will defer EKG to them  6: PAD- - saw vascular Manson Passey) 07/24 - goes to the wound center - had right lower leg aortogram and angioplasty of right posterior tibial artery and right popliteal artery on 03/27/23 - says that his right foot now hurts at times and he understands it's because of the blood flow now  Return in 2-3 months, sooner if needed.

## 2023-04-05 ENCOUNTER — Ambulatory Visit: Payer: PPO | Admitting: Cardiology

## 2023-04-05 DIAGNOSIS — H3563 Retinal hemorrhage, bilateral: Secondary | ICD-10-CM | POA: Diagnosis not present

## 2023-04-05 DIAGNOSIS — H35372 Puckering of macula, left eye: Secondary | ICD-10-CM | POA: Diagnosis not present

## 2023-04-05 DIAGNOSIS — H3582 Retinal ischemia: Secondary | ICD-10-CM | POA: Diagnosis not present

## 2023-04-05 DIAGNOSIS — H34813 Central retinal vein occlusion, bilateral, with macular edema: Secondary | ICD-10-CM | POA: Diagnosis not present

## 2023-04-05 DIAGNOSIS — E113393 Type 2 diabetes mellitus with moderate nonproliferative diabetic retinopathy without macular edema, bilateral: Secondary | ICD-10-CM | POA: Diagnosis not present

## 2023-04-06 ENCOUNTER — Ambulatory Visit: Payer: PPO | Admitting: Physician Assistant

## 2023-04-06 ENCOUNTER — Other Ambulatory Visit: Payer: Self-pay | Admitting: Family Medicine

## 2023-04-11 ENCOUNTER — Telehealth: Payer: Self-pay | Admitting: Family Medicine

## 2023-04-11 ENCOUNTER — Encounter: Payer: Self-pay | Admitting: Family Medicine

## 2023-04-11 ENCOUNTER — Encounter: Payer: PPO | Attending: Physician Assistant | Admitting: Physician Assistant

## 2023-04-11 ENCOUNTER — Ambulatory Visit (INDEPENDENT_AMBULATORY_CARE_PROVIDER_SITE_OTHER): Payer: PPO | Admitting: Family Medicine

## 2023-04-11 VITALS — BP 100/60 | HR 84 | Temp 97.5°F | Ht 66.0 in | Wt 253.5 lb

## 2023-04-11 DIAGNOSIS — Z789 Other specified health status: Secondary | ICD-10-CM | POA: Diagnosis not present

## 2023-04-11 DIAGNOSIS — E1140 Type 2 diabetes mellitus with diabetic neuropathy, unspecified: Secondary | ICD-10-CM | POA: Insufficient documentation

## 2023-04-11 DIAGNOSIS — I2510 Atherosclerotic heart disease of native coronary artery without angina pectoris: Secondary | ICD-10-CM | POA: Insufficient documentation

## 2023-04-11 DIAGNOSIS — E1151 Type 2 diabetes mellitus with diabetic peripheral angiopathy without gangrene: Secondary | ICD-10-CM | POA: Insufficient documentation

## 2023-04-11 DIAGNOSIS — I110 Hypertensive heart disease with heart failure: Secondary | ICD-10-CM | POA: Diagnosis not present

## 2023-04-11 DIAGNOSIS — E11621 Type 2 diabetes mellitus with foot ulcer: Secondary | ICD-10-CM | POA: Diagnosis not present

## 2023-04-11 DIAGNOSIS — R52 Pain, unspecified: Secondary | ICD-10-CM

## 2023-04-11 DIAGNOSIS — R2689 Other abnormalities of gait and mobility: Secondary | ICD-10-CM

## 2023-04-11 DIAGNOSIS — Z7901 Long term (current) use of anticoagulants: Secondary | ICD-10-CM | POA: Insufficient documentation

## 2023-04-11 DIAGNOSIS — L97512 Non-pressure chronic ulcer of other part of right foot with fat layer exposed: Secondary | ICD-10-CM | POA: Diagnosis not present

## 2023-04-11 DIAGNOSIS — E11319 Type 2 diabetes mellitus with unspecified diabetic retinopathy without macular edema: Secondary | ICD-10-CM | POA: Diagnosis not present

## 2023-04-11 DIAGNOSIS — R296 Repeated falls: Secondary | ICD-10-CM

## 2023-04-11 DIAGNOSIS — Z794 Long term (current) use of insulin: Secondary | ICD-10-CM

## 2023-04-11 DIAGNOSIS — I480 Paroxysmal atrial fibrillation: Secondary | ICD-10-CM | POA: Insufficient documentation

## 2023-04-11 DIAGNOSIS — I739 Peripheral vascular disease, unspecified: Secondary | ICD-10-CM | POA: Diagnosis not present

## 2023-04-11 DIAGNOSIS — I7025 Atherosclerosis of native arteries of other extremities with ulceration: Secondary | ICD-10-CM | POA: Insufficient documentation

## 2023-04-11 DIAGNOSIS — R29898 Other symptoms and signs involving the musculoskeletal system: Secondary | ICD-10-CM

## 2023-04-11 DIAGNOSIS — L97412 Non-pressure chronic ulcer of right heel and midfoot with fat layer exposed: Secondary | ICD-10-CM | POA: Diagnosis not present

## 2023-04-11 NOTE — Progress Notes (Signed)
Patient ID: [name], male    DOB: 05-07-1951, 72 y.o.   MRN: [id number]  This visit was conducted in person.  BP 100/60 (BP Location: Right Arm, Patient Position: Sitting, Cuff Size: Large)   Pulse 84   Temp (!) 97.5 F (36.4 C) (Temporal)   Ht 5\' 6"  (1.676 m)   Wt 253 lb 8 oz (115 kg)   SpO2 97%   BMI 40.92 kg/m    CC:  Chief Complaint  Patient presents with   Wheelchair Accessment    Subjective:   HPI: [name] is a 72 y.o. male presenting on 04/11/2023 for Wheelchair Accessment   Patient reports continued mobility issues . He presents for a mobility assessment for a power wheelchair.  Decreased mobility due to chronic low back pain, chronic leg weakness, morbid obesity, peripheral neuropathy, PAD and balance issues resulting in need for hover-round/power mobility device to use  in house.   PMD is necessary to perform ADLs at home including getting to bathroom to toilet, getting to kitchen to eat and cooking, getting to bedroom to dress etc.  He is out of balance using a cane.  Legs given out on him after 8 feet of walking... new diagnosis of PAD and   Recent revascularization in right leg... now diabetic ulcer now healing slowly but better than before... no improvement in leg pain/weakness. Can  still only stand 30 seconds with leaning on anything. He has fallen multiple times given leg weakness and balance issues.  He cannot use a cane/walker given his poor balance and a leg weakness.  His finger numbness  and cramping that makes it difficult propel a manual wheelchair. Also chronic low back pain limits his ablity to maneuver a manual wheel chair. A motorized wheelchair and Hoveround would be able to fit in his house but  He has back pain and decreased back stability that would make the electric wheelchair the best device for his use.   Also his weakness in his lower extremities would limit him from safely stepping up to a platform to mount a Hoveround  scooter. He has poor balance and frequent falls given balance issues , pain and weakness issues  He has a ramp at home given he does not have strength to step up  a step.  He can operate a mobility device safely, both mentally and physically.  He is willing and motivated to use the device.Marland Kitchen He would uses it daily during all waking hours.     Relevant past medical, surgical, family and social history reviewed and updated as indicated. Interim medical history since our last visit reviewed. Allergies and medications reviewed and updated. Outpatient Medications Prior to Visit  Medication Sig Dispense Refill   Acetaminophen (TYLENOL PO) Take 3-4 tablets by mouth as needed (pain).     apixaban (ELIQUIS) 5 MG TABS tablet Take 1 tablet (5 mg total) by mouth 2 (two) times daily. 56 tablet 0   carvedilol (COREG) 12.5 MG tablet Take 1 tablet (12.5 mg total) by mouth 2 (two) times daily with a meal. 180 tablet 3   cholecalciferol (VITAMIN D3) 25 MCG (1000 UNIT) tablet Take 2,000 Units by mouth daily.     clopidogrel (PLAVIX) 75 MG tablet TAKE ONE TABLET BY MOUTH ONCE DAILY WITH BREAKFAST 90 tablet 3   Continuous Blood Gluc Sensor (FREESTYLE LIBRE 2 SENSOR) MISC APPLY SENSOR EVERY 14 DAYS TO MONITOR SUGAR CONTINOUSLY 2 each 11   Continuous Glucose Receiver (FREESTYLE LIBRE  2 READER) DEVI USE WITH SENSORS TO MONITOR SUGAR CONTINUOUSLY 1 each 0   Dulaglutide (TRULICITY) 4.5 MG/0.5ML SOPN Inject 4.5 mg into the skin once a week. Via [employer] PAP     empagliflozin (JARDIANCE) 10 MG TABS tablet Take 1 tablet (10 mg total) by mouth daily. 30 tablet 5   ENTRESTO 24-26 MG TAKE ONE TABLET BY MOUTH TWO (TWO) TIMES DAILY. 60 tablet 3   ezetimibe (ZETIA) 10 MG tablet TAKE ONE TABLET BY MOUTH ONCE DAILY 30 tablet 2   furosemide (LASIX) 40 MG tablet Take 40 mg by mouth daily.     HYDROcodone-acetaminophen (NORCO) 5-325 MG tablet Take 1-2 tablets by mouth daily as needed for moderate pain. 60 tablet 0    HYDROcodone-acetaminophen (NORCO/VICODIN) 5-325 MG tablet TAKE ONE (1) TO TWO (2) TABLETS BY MOUTH DAILY AS NEEDED FOR MODERATE PAIN. MAY 60 tablet 0   HYDROcodone-acetaminophen (NORCO/VICODIN) 5-325 MG tablet Take 1-2 tablets by mouth daily as needed for moderate pain.     hydrOXYzine (ATARAX) 10 MG tablet TAKE 1 TABLET BY MOUTH 3 TIMES DAILY AS NEEDED FOR ANXIETY 30 tablet 2   insulin aspart (NOVOLOG FLEXPEN) 100 UNIT/ML FlexPen Inject 13 Units into the skin every morning.     isosorbide mononitrate (IMDUR) 30 MG 24 hr tablet Take 1 tablet (30 mg total) by mouth 2 (two) times daily. 60 tablet 2   nitroGLYCERIN (NITROSTAT) 0.4 MG SL tablet DISSOLVE 1 TABLET UNDER TONGUE AS NEEDEDFOR CHEST PAIN. MAY REPEAT 5 MINUTES APART 3 TIMES IF NEEDED 25 tablet 3   spironolactone (ALDACTONE) 25 MG tablet Take 1 tablet (25 mg total) by mouth daily. 30 tablet 5   TRESIBA FLEXTOUCH 100 UNIT/ML FlexTouch Pen INJECT 50 UNITS INTO THE SKIN DAILY 15 mL 2   TRUEPLUS 5-BEVEL PEN NEEDLES 31G X 6 MM MISC USE TO INJECT INSULIN THREE TIMES A DAY 300 each 3   venlafaxine XR (EFFEXOR-XR) 150 MG 24 hr capsule TAKE ONE CAPSULE BY MOUTH DAILY WITH BREAKFAST. TAKE WITH EFFEXOR XR 75MG  FOR A TOTAL OF 225MG  90 capsule 0   venlafaxine XR (EFFEXOR-XR) 75 MG 24 hr capsule TAKE ONE CAPSULE BY MOUTH ONCE DAILY. TAKE IN ADDITION TO THE 150 MG CAPSULE FOR A TOTAL DOSE OF 225MG  DAILY 90 capsule 0   vitamin B-12 (CYANOCOBALAMIN) 1000 MCG tablet Take 1,000 mcg by mouth daily.     furosemide (LASIX) 40 MG tablet TAKE ONE TABLET BY MOUTH ONCE A DAY CAN TAKE A 2ND DAILY DOSE AS NEEDED. (Patient taking differently: Take 20 mg by mouth daily.) 180 tablet 1   insulin aspart (NOVOLOG FLEXPEN) 100 UNIT/ML FlexPen 13 units in AM (scheduled) and 3 units PRN in evening (Patient taking differently: Inject 13 Units into the skin in the morning.) 15 mL 6   guaiFENesin-dextromethorphan (ROBITUSSIN DM) 100-10 MG/5ML syrup Take 5 mLs by mouth every 4 (four)  hours as needed for cough. (Patient not taking: Reported on 03/27/2023) 118 mL 0   ketoconazole (NIZORAL) 2 % cream Apply 1 Application topically daily. (Patient not taking: Reported on 04/03/2023) 15 g 0   triamcinolone cream (KENALOG) 0.5 % APPLY ONE APPLICATION TOPICALLY TWO (TWO) TIMES DAILY. (Patient not taking: Reported on 03/27/2023) 30 g 0   No facility-administered medications prior to visit.     Per HPI unless specifically indicated in ROS section below Review of Systems  Constitutional:  Positive for fatigue. Negative for diaphoresis and fever.  HENT:  Negative for ear pain.   Eyes:  Negative for pain.  Respiratory:  Negative for cough and shortness of breath.   Cardiovascular:  Negative for chest pain, palpitations and leg swelling.  Gastrointestinal:  Negative for abdominal pain.  Genitourinary:  Negative for dysuria.  Musculoskeletal:  Negative for arthralgias.  Neurological:  Negative for syncope, light-headedness and headaches.  Psychiatric/Behavioral:  Negative for dysphoric mood.    Objective:  BP 100/60 (BP Location: Right Arm, Patient Position: Sitting, Cuff Size: Large)   Pulse 84   Temp (!) 97.5 F (36.4 C) (Temporal)   Ht 5\' 6"  (1.676 m)   Wt 253 lb 8 oz (115 kg)   SpO2 97%   BMI 40.92 kg/m   Wt Readings from Last 3 Encounters:  04/11/23 253 lb 8 oz (115 kg)  04/03/23 255 lb (115.7 kg)  03/27/23 248 lb (112.5 kg)      Physical Exam Constitutional:      Appearance: He is well-developed. He is obese.  HENT:     Head: Normocephalic.     Right Ear: Hearing normal.     Left Ear: Hearing normal.     Nose: Nose normal.  Neck:     Thyroid: No thyroid mass or thyromegaly.     Vascular: No carotid bruit.     Trachea: Trachea normal.  Cardiovascular:     Rate and Rhythm: Normal rate. Rhythm irregularly irregular.     Pulses: Normal pulses.     Heart sounds: Heart sounds not distant. No murmur heard.    No friction rub. No gallop.     Comments: No  peripheral edema Pulmonary:     Effort: Pulmonary effort is normal. No respiratory distress.     Breath sounds: Normal breath sounds.  Musculoskeletal:     Right shoulder: Decreased range of motion.     Left shoulder: Decreased range of motion.     Cervical back: Decreased range of motion.     Thoracic back: Decreased range of motion.     Lumbar back: Tenderness and bony tenderness present. Decreased range of motion. Positive right straight leg raise test and positive left straight leg raise test.     Right hip: Decreased range of motion.     Left hip: Decreased range of motion.     Right knee: Bony tenderness present. Decreased range of motion. Tenderness present over the medial joint line and lateral joint line.     Left knee: Bony tenderness present. Decreased range of motion. Tenderness present over the medial joint line and lateral joint line.     Comments:  Pain in legs with standing 10/10  Skin:    General: Skin is warm and dry.     Findings: No rash.  Neurological:     Mental Status: He is oriented to person, place, and time. Mental status is at baseline.     Cranial Nerves: Cranial nerves 2-12 are intact.     Sensory: Sensory deficit present.     Motor: Weakness present.     Comments: Decreased sensation to light touch in feet Upper extremity strength 5/5 bilaterally except 4/5 grip strength bilaterally Hip flexor strength 3/5 bilaterally Knee extension and flexion 4/5 bilaterally Ankle flexion extension 4/5 bilaterally  Gait wobbly and antalgic. Unable to ambulate more than 5 feet without assistance. Can only stand unassisted for 5 seconds without needing to sit given bilateral leg weakness.  Psychiatric:        Speech: Speech normal.        Behavior: Behavior normal.  Thought Content: Thought content normal.       Results for orders placed or performed during the hospital encounter of 03/27/23  BUN  Result Value Ref Range   BUN 26 (H) 8 - 23 mg/dL   Creatinine, serum  Result Value Ref Range   Creatinine, Ser 0.99 0.61 - 1.24 mg/dL   GFR, Estimated >82 >95 mL/min  Glucose, capillary  Result Value Ref Range   Glucose-Capillary 220 (H) 70 - 99 mg/dL  Glucose, capillary  Result Value Ref Range   Glucose-Capillary 148 (H) 70 - 99 mg/dL  Glucose, capillary  Result Value Ref Range   Glucose-Capillary 126 (H) 70 - 99 mg/dL    Assessment and Plan  Alteration in mobility associated with pain Assessment & Plan: Decreased mobility due to chronic low back pain, chronic leg weakness, morbid obesity, peripheral neuropathy, PAD and balance issues resulting in need for hover-round/power mobility device to use  in house.   PMD is necessary to perform ADLs at home including getting to bathroom to toilet, getting to kitchen to eat and cooking, getting to bedroom to dress etc.  He is out of balance using a cane.  Legs given out on him after 8 feet of walking... new diagnosis of PAD and   Recent revascularization in right leg... now diabetic ulcer now healing slowly but better than before... no improvement in leg pain/weakness. Can  still only stand 30 seconds with leaning on anything. He has fallen multiple times given leg weakness and balance issues.  He cannot use a cane/walker given his poor balance and a leg weakness.  His finger numbness  and cramping that makes it difficult propel a manual wheelchair. Also chronic low back pain limits his ablity to maneuver a manual wheel chair. A motorized wheelchair and Hoveround would be able to fit in his house but  He has back pain and decreased back stability that would make the electric wheelchair the best device for his use.   Also his weakness in his lower extremities would limit him from safely stepping up to a platform to mount a Hoveround scooter. He has poor balance and frequent falls given balnce , pain and weakness issues  He has a ramp at home given he does not have strength to step up  a  step.  He can operate a mobility device safely, both mentally and physically.  He is willing and motivated to use the device.Marland Kitchen He would uses it daily during all waking hours.   Leg weakness, bilateral  PAD (peripheral artery disease) (HCC)  Type 2 diabetes mellitus with retinopathy of both eyes, with long-term current use of insulin, macular edema presence unspecified, unspecified retinopathy severity (HCC)  Poor balance  Frequent falls  Decreased mobility    No follow-ups on file.   Kerby Nora, MD

## 2023-04-11 NOTE — Telephone Encounter (Signed)
Left message for Eunice Blase to return call in regards to Mr. Ulbrich electric wheelchair order and office note submitted today.  Just wanted to make sure they have everything that is needed.

## 2023-04-11 NOTE — Assessment & Plan Note (Signed)
Decreased mobility due to chronic low back pain, chronic leg weakness, morbid obesity, peripheral neuropathy, PAD and balance issues resulting in need for hover-round/power mobility device to use  in house.   PMD is necessary to perform ADLs at home including getting to bathroom to toilet, getting to kitchen to eat and cooking, getting to bedroom to dress etc.  He is out of balance using a cane.  Legs given out on him after 8 feet of walking... new diagnosis of PAD and   Recent revascularization in right leg... now diabetic ulcer now healing slowly but better than before... no improvement in leg pain/weakness. Can  still only stand 30 seconds with leaning on anything. He has fallen multiple times given leg weakness and balance issues.  He cannot use a cane/walker given his poor balance and a leg weakness.  His finger numbness  and cramping that makes it difficult propel a manual wheelchair. Also chronic low back pain limits his ablity to maneuver a manual wheel chair. A motorized wheelchair and Hoveround would be able to fit in his house but  He has back pain and decreased back stability that would make the electric wheelchair the best device for his use.   Also his weakness in his lower extremities would limit him from safely stepping up to a platform to mount a Hoveround scooter. He has poor balance and frequent falls given balnce , pain and weakness issues  He has a ramp at home given he does not have strength to step up  a step.  He can operate a mobility device safely, both mentally and physically.  He is willing and motivated to use the device.Marland Kitchen He would uses it daily during all waking hours.

## 2023-04-11 NOTE — Telephone Encounter (Signed)
Debbie from [facility] return donnas call from earlier request a call back  when  possible at [phone]

## 2023-04-11 NOTE — Progress Notes (Signed)
[name], OO ([id number]) [id number] Page 1 of 10 Visit Report for 04/11/2023 Chief Complaint Document Details Patient Name: Date of Service: [name] [facility] H. 04/11/2023 3:30 PM Medical Record Number: [id number] Patient Account Number: [id number] Date of Birth/Sex: Treating RN: 10/18/1950 (72 y.o. [name] Primary Care Provider: Kerby Nora Other Clinician: Referring Provider: Treating Provider/Extender: [name], [name] in Treatment: 10 Information Obtained from: Patient Chief Complaint Right foot ulcers Electronic Signature(s) Signed: 04/11/2023 3:18:56 PM By: Allen Derry PA-C Entered By: Allen Derry on 04/11/2023 15:18:56 -------------------------------------------------------------------------------- Debridement Details Patient Name: Date of Service: [name] [facility] H. 04/11/2023 3:30 PM Medical Record Number: [id number] Patient Account Number: [id number] Date of Birth/Sex: Treating RN: 04/26/1951 (72 y.o. [name] Primary Care Provider: Kerby Nora Other Clinician: Referring Provider: Treating Provider/Extender: [name], [name] in Treatment: 10 Debridement Performed for Assessment: Wound #2 Right,Distal,Dorsal Foot Performed By: Physician Allen Derry, PA-C Debridement Type: Debridement Severity of Tissue Pre Debridement: Fat layer exposed Level of Consciousness (Pre-procedure): Awake and Alert Pre-procedure Verification/Time Out Yes - 03:56 Taken: Start Time: 03:56 Pain Control: Lidocaine 4% T opical Solution Percent of Wound Bed Debrided: 100% T Area Debrided (cm): otal 0.94 Tissue and other material debrided: Viable, Non-Viable, Slough, Subcutaneous, Slough Level: Skin/Subcutaneous Tissue Debridement Description: Excisional Instrument: Curette Bleeding: Moderate Hemostasis Achieved: Pressure Procedural Pain: 0 Post Procedural PainIzora Gala [name], [name] ([id number])  [id number] Page 2 of 10 Response to Treatment: Procedure was tolerated well Level of Consciousness (Post- Awake and Alert procedure): Post Debridement Measurements of Total Wound Length: (cm) 1.2 Width: (cm) 1 Depth: (cm) 0.3 Volume: (cm) 0.283 Character of Wound/Ulcer Post Debridement: Stable Severity of Tissue Post Debridement: Fat layer exposed Post Procedure Diagnosis Same as Pre-procedure Electronic Signature(s) Signed: 04/11/2023 4:30:30 PM By: Allen Derry PA-C Signed: 04/11/2023 4:57:03 PM By: Midge Aver MSN RN CNS WTA Entered By: Midge Aver on 04/11/2023 15:57:02 -------------------------------------------------------------------------------- Debridement Details Patient Name: Date of Service: [name] [facility] H. 04/11/2023 3:30 PM Medical Record Number: [id number] Patient Account Number: [id number] Date of Birth/Sex: Treating RN: October 27, 1950 (72 y.o. [name] Primary Care Provider: Kerby Nora Other Clinician: Referring Provider: Treating Provider/Extender: [name], [name] in Treatment: 10 Debridement Performed for Assessment: Wound #1 Right Calcaneus Performed By: Physician Allen Derry, PA-C Debridement Type: Debridement Severity of Tissue Pre Debridement: Fat layer exposed Level of Consciousness (Pre-procedure): Awake and Alert Pre-procedure Verification/Time Out Yes - 03:56 Taken: Start Time: 03:56 Pain Control: Lidocaine 4% T opical Solution Percent of Wound Bed Debrided: 100% T Area Debrided (cm): otal 0.94 Tissue and other material debrided: Viable, Non-Viable, Slough, Subcutaneous, Slough Level: Skin/Subcutaneous Tissue Debridement Description: Excisional Instrument: Curette Bleeding: Moderate Hemostasis Achieved: Pressure Procedural Pain: 0 Post Procedural Pain: 0 Response to Treatment: Procedure was tolerated well Level of Consciousness (Post- Awake and Alert procedure): Post Debridement Measurements  of Total Wound Length: (cm) 1.5 Width: (cm) 0.8 Depth: (cm) 0.4 Volume: (cm) 0.377 Character of Wound/Ulcer Post Debridement: Stable Severity of Tissue Post Debridement: Fat layer exposed Post Procedure Diagnosis Same as [name], [name] ([id number]) [id number] Page 3 of 10 Electronic Signature(s) Signed: 04/11/2023 4:30:30 PM By: Allen Derry PA-C Signed: 04/11/2023 4:57:03 PM By: Midge Aver MSN RN CNS WTA Entered By: Midge Aver on 04/11/2023 15:57:45 -------------------------------------------------------------------------------- Debridement Details Patient Name: Date of Service: [name] [facility] H. 04/11/2023 3:30 PM Medical Record Number: [id number] Patient Account Number: [id number] Date of Birth/Sex: Treating RN: [birth year] (72 y.o. [name],  [name] Primary Care Provider: Kerby Nora Other Clinician: Referring Provider: Treating Provider/Extender: [name], [name] in Treatment: 10 Debridement Performed for Assessment: Wound #3 Right,Proximal,Dorsal Foot Performed By: Physician Allen Derry, PA-C Debridement Type: Debridement Severity of Tissue Pre Debridement: Fat layer exposed Level of Consciousness (Pre-procedure): Awake and Alert Pre-procedure Verification/Time Out Yes - 03:56 Taken: Start Time: 03:56 Pain Control: Lidocaine 4% T opical Solution Percent of Wound Bed Debrided: 100% T Area Debrided (cm): otal 2 Tissue and other material debrided: Viable, Non-Viable, Slough, Subcutaneous, Slough Level: Skin/Subcutaneous Tissue Debridement Description: Excisional Instrument: Curette Bleeding: Moderate Hemostasis Achieved: Pressure Procedural Pain: 0 Post Procedural Pain: 0 Response to Treatment: Procedure was tolerated well Level of Consciousness (Post- Awake and Alert procedure): Post Debridement Measurements of Total Wound Length: (cm) 1.5 Width: (cm) 1.7 Depth: (cm) 0.3 Volume: (cm) 0.601 Character of  Wound/Ulcer Post Debridement: Stable Severity of Tissue Post Debridement: Fat layer exposed Post Procedure Diagnosis Same as Pre-procedure Electronic Signature(s) Signed: 04/11/2023 4:30:30 PM By: Allen Derry PA-C Signed: 04/11/2023 4:57:03 PM By: Midge Aver MSN RN CNS WTA Entered By: Midge Aver on 04/11/2023 15:58:58 [name] ([id number]) [id number] Page 4 of 10 -------------------------------------------------------------------------------- HPI Details Patient Name: Date of Service: [name] [facility] H. 04/11/2023 3:30 PM Medical Record Number: [id number] Patient Account Number: [id number] Date of Birth/Sex: Treating RN: 18-Jun-1951 (71 y.o. [name] Primary Care Provider: Kerby Nora Other Clinician: Referring Provider: Treating Provider/Extender: [name], [name] in Treatment: 10 History of Present Illness HPI Description: 01-31-2023 upon evaluation today patient appears to be doing poorly currently in regard to wounds on his right heel, right dorsal foot, and right lateral foot. Subsequently he does have known peripheral vascular disease and again this is something that he needs to I think Checked formally. This is what the majority of the conversation today hinged around and the patient voiced understanding as far as that is concerned. Fortunately I do not see any signs of active infection locally nor systemically which is great news. Patient does have a history of diabetes mellitus type 2, atrial fibrillation for which she is on long-term anticoagulant Therapy, hypertension, and coronary artery disease. Patient's hemoglobin A1c most recently was on 11-17-2022 and was 7.2 and currently he is on Eliquis and Plavix. 02-07-2023 upon evaluation today patient appears to be doing well currently in regard to his wounds all things considered I feel like we are still maintaining that does not seem like it is any worse is also not significantly  better. I discussed with the patient that I do believe he would benefit from the arterial evaluation we still need to get this done as quickly as possible and subsequently we did put in a follow-up call with the vascular office today with regard to this. 02-14-2023 upon evaluation today patient appears to be doing a little better in regard to his wounds which are showing signs of loosening which is great news. With that being said he is experiencing an improvement overall in his symptoms and we are still waiting on the vascular evaluation want to get this result and consider whether or not his blood flow is good or not we will be able to make a better determination of next steps. For now we will try and avoid any aggressive sharp debridement to know that he has good arterial flow. 02-21-2023 upon evaluation today patient appears to be doing about the same in regard to his wound. Fortunately there does not appear to be any  signs of active infection at this time which is great news. No fevers, chills, nausea, vomiting, or diarrhea. I did review patient's arterial study and it appears that he has pretty good flow in the left is not perfect but it is decent. On the right however he is definitely not doing nearly as good and I think that he is going to need to see one of the vascular doctors for further evaluation and treatment of this right leg in order to get these wounds to heal. 02-28-2023 upon evaluation today patient appears to be doing well currently in regard to his wound. He has been tolerating the dressing changes without complication. Fortunately I do not see any evidence of active infection locally nor systemically at this time. I do think that the eschar started to soften up and I would like to try to get some of this off to see if we can get things moving in the right direction. He is in agreement with this plan. 03-09-2023 upon evaluation today patient appears to be doing well currently in regard  to his wound. He has been tolerating the dressing changes without complication. Fortunately there does not appear to be any signs of active infection locally nor systemically at this time which is great news. I do believe clearing out some of the necrotic debris last week has helped to a degree. 03-16-2023 upon evaluation today patient appears to be doing well currently in regard to his wounds. He is going to be having a vascular angiogram in order to open up blood flow and I think this is going to be very beneficial for him. Fortunately I do not see any evidence of active infection locally nor systemically which is great news. 7/25; patient with predominantly ischemic wounds to on his dorsal foot and 1 on the right heel. He is going for his angiogram on Monday. He has been using the Iodoflex to the wound and changing this dressing himself. 04-11-2023 upon evaluation today patient appears to be doing well currently in regard to his wounds. In fact he had the arterial procedure with vascular and the great news is he actually has much improved blood flow he had a 95% blockage of the popliteal as well as the posterior tibial based on the record review. Post procedure he now has a residual of only about 30% in the popliteal and closer around 10% in the posterior tibial. The blood flow is greatly improved and it is obvious based on what we are seeing. Electronic Signature(s) Signed: 04/11/2023 4:01:29 PM By: Allen Derry PA-C Entered By: Allen Derry on 04/11/2023 16:01:29 [name] ([id number]) [id number] Page 5 of 10 -------------------------------------------------------------------------------- Physical Exam Details Patient Name: Date of Service: [name] [facility] H. 04/11/2023 3:30 PM Medical Record Number: [id number] Patient Account Number: [id number] Date of Birth/Sex: Treating RN: 05/15/1951 (72 y.o. [name] Primary Care Provider: Kerby Nora Other  Clinician: Referring Provider: Treating Provider/Extender: [name], [name] in Treatment: 10 Constitutional Well-nourished and well-hydrated in no acute distress. Respiratory normal breathing without difficulty. Psychiatric this patient is able to make decisions and demonstrates good insight into disease process. Alert and Oriented x 3. pleasant and cooperative. Notes Upon inspection patient's wound bed actually showed signs of good granulation epithelization at this point. Fortunately I do not see any signs of worsening or infection at this time. I actually very pleased with where we stand I think the patient is moving in the right direction here. Electronic Signature(s) Signed: 04/11/2023  4:01:44 PM By: Allen Derry PA-C Entered By: Allen Derry on 04/11/2023 16:01:43 -------------------------------------------------------------------------------- Physician Orders Details Patient Name: Date of Service: [name] [facility] H. 04/11/2023 3:30 PM Medical Record Number: [id number] Patient Account Number: [id number] Date of Birth/Sex: Treating RN: [birth year] (72 y.o. [name] Primary Care Provider: Kerby Nora Other Clinician: Referring Provider: Treating Provider/Extender: [name], [name] in Treatment: 10 Verbal / Phone Orders: No Diagnosis Coding ICD-10 Coding Code Description E11.621 Type 2 diabetes mellitus with foot ulcer L97.512 Non-pressure chronic ulcer of other part of right foot with fat layer exposed I48.0 Paroxysmal atrial fibrillation Z79.01 Long term (current) use of anticoagulants I10 Essential (primary) hypertension I25.10 Atherosclerotic heart disease of native coronary artery without angina pectoris [name], [name] ([id number]) [id number] Page 6 of 10 Follow-up Appointments Return Appointment in 1 week. Bathing/ [employer] wounds with antibacterial soap and water. Anesthetic (Use 'Patient  Medications' Section for Anesthetic Order Entry) Lidocaine applied to wound bed Wound Treatment Wound #1 - Calcaneus Wound Laterality: Right Cleanser: Soap and Water Every Other Day/30 Days Discharge Instructions: Gently cleanse wound with antibacterial soap, rinse and pat dry prior to dressing wounds Prim Dressing: Prisma 4.34 (in) Every Other Day/30 Days ary Discharge Instructions: Moisten w/normal saline or sterile water; Cover wound as directed. Do not remove from wound bed. Secondary Dressing: (BORDER) Zetuvit Plus SILICONE BORDER Dressing 4x4 (in/in) (Generic) Every Other Day/30 Days Discharge Instructions: Please do not put silicone bordered dressings under wraps. Use non-bordered dressing only. Wound #2 - Foot Wound Laterality: Dorsal, Right, Distal Cleanser: Soap and Water Every Other Day/30 Days Discharge Instructions: Gently cleanse wound with antibacterial soap, rinse and pat dry prior to dressing wounds Prim Dressing: Prisma 4.34 (in) Every Other Day/30 Days ary Discharge Instructions: Moisten w/normal saline or sterile water; Cover wound as directed. Do not remove from wound bed. Secondary Dressing: (BORDER) Zetuvit Plus SILICONE BORDER Dressing 4x4 (in/in) (Generic) Every Other Day/30 Days Discharge Instructions: Please do not put silicone bordered dressings under wraps. Use non-bordered dressing only. Wound #3 - Foot Wound Laterality: Dorsal, Right, Proximal Cleanser: Soap and Water Every Other Day/30 Days Discharge Instructions: Gently cleanse wound with antibacterial soap, rinse and pat dry prior to dressing wounds Prim Dressing: Prisma 4.34 (in) Every Other Day/30 Days ary Discharge Instructions: Moisten w/normal saline or sterile water; Cover wound as directed. Do not remove from wound bed. Secondary Dressing: (BORDER) Zetuvit Plus SILICONE BORDER Dressing 4x4 (in/in) (Generic) Every Other Day/30 Days Discharge Instructions: Please do not put silicone bordered dressings  under wraps. Use non-bordered dressing only. Electronic Signature(s) Signed: 04/11/2023 4:57:03 PM By: Midge Aver MSN RN CNS WTA Signed: 04/12/2023 5:04:56 PM By: Allen Derry PA-C Previous Signature: 04/11/2023 4:30:30 PM Version By: Allen Derry PA-C Entered By: Midge Aver on 04/11/2023 16:31:01 -------------------------------------------------------------------------------- Problem List Details Patient Name: Date of Service: [name] [facility] H. 04/11/2023 3:30 PM Medical Record Number: [id number] Patient Account Number: [id number] Date of Birth/Sex: Treating RN: 03-21-1951 (72 y.o. [name] Primary Care Provider: Kerby Nora Other Clinician: Referring Provider: Treating Provider/Extender: [name], [name] in Treatment: [address] Problems ICD-10 ELIZA, DOBROWOLSKI ([id number]) [id number] Page 7 of 10 Encounter Code Description Active Date MDM Diagnosis E11.621 Type 2 diabetes mellitus with foot ulcer 01/31/2023 No Yes L97.512 Non-pressure chronic ulcer of other part of right foot with fat layer exposed 01/31/2023 No Yes I48.0 Paroxysmal atrial fibrillation 01/31/2023 No Yes Z79.01 Long term (current) use of anticoagulants 01/31/2023 No Yes  I10 Essential (primary) hypertension 01/31/2023 No Yes I25.10 Atherosclerotic heart disease of native coronary artery without angina pectoris 01/31/2023 No Yes Inactive Problems Resolved Problems Electronic Signature(s) Signed: 04/11/2023 3:18:53 PM By: Allen Derry PA-C Entered By: Allen Derry on 04/11/2023 15:18:53 -------------------------------------------------------------------------------- Progress Note Details Patient Name: Date of Service: [name] HN H. 04/11/2023 3:30 PM Medical Record Number: [id number] Patient Account Number: [id number] Date of Birth/Sex: Treating RN: 1951/02/03 (72 y.o. [name] Primary Care Provider: Kerby Nora Other Clinician: Referring Provider: Treating  Provider/Extender: [name], [name] in Treatment: 10 Subjective Chief Complaint Information obtained from Patient Right foot ulcers History of Present Illness (HPI) 01-31-2023 upon evaluation today patient appears to be doing poorly currently in regard to wounds on his right heel, right dorsal foot, and right lateral foot. Subsequently he does have known peripheral vascular disease and again this is something that he needs to I think Checked formally. This is what the majority of the conversation today hinged around and the patient voiced understanding as far as that is concerned. Fortunately I do not see any signs of active infection locally nor systemically which is great news. Patient does have a history of diabetes mellitus type 2, atrial fibrillation for which she is on long-term anticoagulant Therapy, hypertension, and coronary artery disease. Patient's hemoglobin A1c most recently was on 11-17-2022 and was 7.2 and currently he is on Eliquis and Plavix. 02-07-2023 upon evaluation today patient appears to be doing well currently in regard to his wounds all things considered I feel like we are still maintaining that does not seem like it is any worse is also not significantly better. I discussed with the patient that I do believe he would benefit from the arterial evaluation we still need to get this done as quickly as possible and subsequently we did put in a follow-up call with the vascular office today with regard to this. 02-14-2023 upon evaluation today patient appears to be doing a little better in regard to his wounds which are showing signs of loosening which is great news. [name], [name] ([id number]) [id number] Page 8 of 10 With that being said he is experiencing an improvement overall in his symptoms and we are still waiting on the vascular evaluation want to get this result and consider whether or not his blood flow is good or not we will be  able to make a better determination of next steps. For now we will try and avoid any aggressive sharp debridement to know that he has good arterial flow. 02-21-2023 upon evaluation today patient appears to be doing about the same in regard to his wound. Fortunately there does not appear to be any signs of active infection at this time which is great news. No fevers, chills, nausea, vomiting, or diarrhea. I did review patient's arterial study and it appears that he has pretty good flow in the left is not perfect but it is decent. On the right however he is definitely not doing nearly as good and I think that he is going to need to see one of the vascular doctors for further evaluation and treatment of this right leg in order to get these wounds to heal. 02-28-2023 upon evaluation today patient appears to be doing well currently in regard to his wound. He has been tolerating the dressing changes without complication. Fortunately I do not see any evidence of active infection locally nor systemically at this time. I do think that the eschar started to soften up and  I would like to try to get some of this off to see if we can get things moving in the right direction. He is in agreement with this plan. 03-09-2023 upon evaluation today patient appears to be doing well currently in regard to his wound. He has been tolerating the dressing changes without complication. Fortunately there does not appear to be any signs of active infection locally nor systemically at this time which is great news. I do believe clearing out some of the necrotic debris last week has helped to a degree. 03-16-2023 upon evaluation today patient appears to be doing well currently in regard to his wounds. He is going to be having a vascular angiogram in order to open up blood flow and I think this is going to be very beneficial for him. Fortunately I do not see any evidence of active infection locally nor systemically which is great  news. 7/25; patient with predominantly ischemic wounds to on his dorsal foot and 1 on the right heel. He is going for his angiogram on Monday. He has been using the Iodoflex to the wound and changing this dressing himself. 04-11-2023 upon evaluation today patient appears to be doing well currently in regard to his wounds. In fact he had the arterial procedure with vascular and the great news is he actually has much improved blood flow he had a 95% blockage of the popliteal as well as the posterior tibial based on the record review. Post procedure he now has a residual of only about 30% in the popliteal and closer around 10% in the posterior tibial. The blood flow is greatly improved and it is obvious based on what we are seeing. Objective Constitutional Well-nourished and well-hydrated in no acute distress. Vitals Time Taken: 3:20 PM, Height: 66 in, Weight: 250 lbs, BMI: 40.3, Temperature: 98.2 F, Pulse: 79 bpm, Respiratory Rate: 18 breaths/min, Blood Pressure: 127/73 mmHg. Respiratory normal breathing without difficulty. Psychiatric this patient is able to make decisions and demonstrates good insight into disease process. Alert and Oriented x 3. pleasant and cooperative. General Notes: Upon inspection patient's wound bed actually showed signs of good granulation epithelization at this point. Fortunately I do not see any signs of worsening or infection at this time. I actually very pleased with where we stand I think the patient is moving in the right direction here. Integumentary (Hair, Skin) Wound #1 status is Open. Original cause of wound was Gradually Appeared. The date acquired was: 12/10/2022. The wound has been in treatment 10 weeks. The wound is located on the Right Calcaneus. The wound measures 1.5cm length x 0.8cm width x 0.4cm depth; 0.942cm^2 area and 0.377cm^3 volume. There is Fat Layer (Subcutaneous Tissue) exposed. There is a medium amount of serosanguineous drainage noted. There is  medium (34-66%) red granulation within the wound bed. There is no necrotic tissue within the wound bed. Wound #2 status is Open. Original cause of wound was Gradually Appeared. The date acquired was: 12/10/2022. The wound has been in treatment 10 weeks. The wound is located on the Right,Distal,Dorsal Foot. The wound measures 1.2cm length x 1cm width x 0.3cm depth; 0.942cm^2 area and 0.283cm^3 volume. There is Fat Layer (Subcutaneous Tissue) exposed. There is a medium amount of serosanguineous drainage noted. There is no granulation within the wound bed. There is no necrotic tissue within the wound bed. Wound #3 status is Open. Original cause of wound was Gradually Appeared. The date acquired was: 12/10/2022. The wound has been in treatment 10 weeks. The wound  is located on the Right,Proximal,Dorsal Foot. The wound measures 1.5cm length x 1.7cm width x 0.3cm depth; 2.003cm^2 area and 0.601cm^3 volume. There is Fat Layer (Subcutaneous Tissue) exposed. There is a medium amount of serosanguineous drainage noted. There is no granulation within the wound bed. There is a large (67-100%) amount of necrotic tissue within the wound bed including Adherent Slough. Assessment Active Problems ICD-10 Type 2 diabetes mellitus with foot ulcer Non-pressure chronic ulcer of other part of right foot with fat layer exposed Paroxysmal atrial fibrillation Long term (current) use of anticoagulants Essential (primary) hypertension Atherosclerotic heart disease of native coronary artery without angina pectoris [name], [name] ([id number]) [id number] Page 9 of 10 Procedures Wound #1 Pre-procedure diagnosis of Wound #1 is a Diabetic Wound/Ulcer of the Lower Extremity located on the Right Calcaneus .Severity of Tissue Pre Debridement is: Fat layer exposed. There was a Excisional Skin/Subcutaneous Tissue Debridement with a total area of 0.94 sq cm performed by Allen Derry, PA-C. With the following  instrument(s): Curette to remove Viable and Non-Viable tissue/material. Material removed includes Subcutaneous Tissue and Slough and after achieving pain control using Lidocaine 4% T opical Solution. No specimens were taken. A time out was conducted at 03:56, prior to the start of the procedure. A Moderate amount of bleeding was controlled with Pressure. The procedure was tolerated well with a pain level of 0 throughout and a pain level of 0 following the procedure. Post Debridement Measurements: 1.5cm length x 0.8cm width x 0.4cm depth; 0.377cm^3 volume. Character of Wound/Ulcer Post Debridement is stable. Severity of Tissue Post Debridement is: Fat layer exposed. Post procedure Diagnosis Wound #1: Same as Pre-Procedure Wound #2 Pre-procedure diagnosis of Wound #2 is a Diabetic Wound/Ulcer of the Lower Extremity located on the Right,Distal,Dorsal Foot .Severity of Tissue Pre Debridement is: Fat layer exposed. There was a Excisional Skin/Subcutaneous Tissue Debridement with a total area of 0.94 sq cm performed by Allen Derry, PA-C. With the following instrument(s): Curette to remove Viable and Non-Viable tissue/material. Material removed includes Subcutaneous Tissue and Slough and after achieving pain control using Lidocaine 4% Topical Solution. No specimens were taken. A time out was conducted at 03:56, prior to the start of the procedure. A Moderate amount of bleeding was controlled with Pressure. The procedure was tolerated well with a pain level of 0 throughout and a pain level of 0 following the procedure. Post Debridement Measurements: 1.2cm length x 1cm width x 0.3cm depth; 0.283cm^3 volume. Character of Wound/Ulcer Post Debridement is stable. Severity of Tissue Post Debridement is: Fat layer exposed. Post procedure Diagnosis Wound #2: Same as Pre-Procedure Wound #3 Pre-procedure diagnosis of Wound #3 is a Diabetic Wound/Ulcer of the Lower Extremity located on the Right,Proximal,Dorsal Foot  .Severity of Tissue Pre Debridement is: Fat layer exposed. There was a Excisional Skin/Subcutaneous Tissue Debridement with a total area of 2 sq cm performed by Allen Derry, PA- C. With the following instrument(s): Curette to remove Viable and Non-Viable tissue/material. Material removed includes Subcutaneous Tissue and Slough and after achieving pain control using Lidocaine 4% T opical Solution. No specimens were taken. A time out was conducted at 03:56, prior to the start of the procedure. A Moderate amount of bleeding was controlled with Pressure. The procedure was tolerated well with a pain level of 0 throughout and a pain level of 0 following the procedure. Post Debridement Measurements: 1.5cm length x 1.7cm width x 0.3cm depth; 0.601cm^3 volume. Character of Wound/Ulcer Post Debridement is stable. Severity of Tissue Post Debridement is: Fat layer  exposed. Post procedure Diagnosis Wound #3: Same as Pre-Procedure Plan Follow-up Appointments: Return Appointment in 1 week. Bathing/ Shower/ Hygiene: Wash wounds with antibacterial soap and water. Anesthetic (Use 'Patient Medications' Section for Anesthetic Order Entry): Lidocaine applied to wound bed WOUND #1: - Calcaneus Wound Laterality: Right Cleanser: Byram Ancillary Kit - 15 Day Supply 3 x Per Week/30 Days Discharge Instructions: Use supplies as instructed; Kit contains: (15) Saline Bullets; (15) 3x3 Gauze; 15 pr Gloves Cleanser: Soap and Water 3 x Per Week/30 Days Discharge Instructions: Gently cleanse wound with antibacterial soap, rinse and pat dry prior to dressing wounds Prim Dressing: Prisma 4.34 (in) 3 x Per Week/30 Days ary Discharge Instructions: Moisten w/normal saline or sterile water; Cover wound as directed. Do not remove from wound bed. Prim Dressing: (BORDER) Zetuvit Plus Silicone Border Dressing 4x4 (in/in) 3 x Per Week/30 Days ary Secondary Dressing: (BORDER) Zetuvit Plus SILICONE BORDER Dressing 4x4 (in/in) (Generic) 3  x Per Week/30 Days Discharge Instructions: Please do not put silicone bordered dressings under wraps. Use non-bordered dressing only. WOUND #2: - Foot Wound Laterality: Dorsal, Right, Distal Cleanser: Byram Ancillary Kit - 15 Day Supply 3 x Per Week/30 Days Discharge Instructions: Use supplies as instructed; Kit contains: (15) Saline Bullets; (15) 3x3 Gauze; 15 pr Gloves Cleanser: Soap and Water 3 x Per Week/30 Days Discharge Instructions: Gently cleanse wound with antibacterial soap, rinse and pat dry prior to dressing wounds Prim Dressing: Prisma 4.34 (in) 3 x Per Week/30 Days ary Discharge Instructions: Moisten w/normal saline or sterile water; Cover wound as directed. Do not remove from wound bed. Prim Dressing: (BORDER) Zetuvit Plus Silicone Border Dressing 4x4 (in/in) 3 x Per Week/30 Days ary Secondary Dressing: (BORDER) Zetuvit Plus SILICONE BORDER Dressing 4x4 (in/in) (Generic) 3 x Per Week/30 Days Discharge Instructions: Please do not put silicone bordered dressings under wraps. Use non-bordered dressing only. WOUND #3: - Foot Wound Laterality: Dorsal, Right, Proximal Cleanser: Byram Ancillary Kit - 15 Day Supply 3 x Per Week/30 Days Discharge Instructions: Use supplies as instructed; Kit contains: (15) Saline Bullets; (15) 3x3 Gauze; 15 pr Gloves Cleanser: Soap and Water 3 x Per Week/30 Days Discharge Instructions: Gently cleanse wound with antibacterial soap, rinse and pat dry prior to dressing wounds Prim Dressing: Prisma 4.34 (in) 3 x Per Week/30 Days ary Discharge Instructions: Moisten w/normal saline or sterile water; Cover wound as directed. Do not remove from wound bed. Prim Dressing: (BORDER) Zetuvit Plus Silicone Border Dressing 4x4 (in/in) 3 x Per Week/30 Days ary Secondary Dressing: (BORDER) Zetuvit Plus SILICONE BORDER Dressing 4x4 (in/in) (Generic) 3 x Per Week/30 Days Discharge Instructions: Please do not put silicone bordered dressings under wraps. Use non-bordered  dressing only. 1. I am going to suggest that we have the patient continue to monitor for any signs of infection or worsening. Based on what I am seeing I do believe that we will make an excellent progress towards complete closure. 2. I am going to recommend we continue with the collagen dressing at this point followed by the bordered foam I think this is can be the best way to go is in agreement with that plan. 3. I am also going to suggest appropriate and continued offloading he is doing a really good job with this and I think now that he has good blood flow his leg [name], [name] ([id number]) [id number] Page 10 of 10 already feels better and the wounds look tremendously better he actually had quite a bit of bleeding with even lighter debridement  than normal today. We will see patient back for reevaluation in 1 week here in the clinic. If anything worsens or changes patient will contact our office for additional recommendations. Electronic Signature(s) Signed: 04/11/2023 4:02:25 PM By: Allen Derry PA-C Entered By: Allen Derry on 04/11/2023 16:02:25 -------------------------------------------------------------------------------- SuperBill Details Patient Name: Date of Service: [name] HN H. 04/11/2023 Medical Record Number: [id number] Patient Account Number: [id number] Date of Birth/Sex: Treating RN: May 25, 1951 (72 y.o. [name] Primary Care Provider: Kerby Nora Other Clinician: Referring Provider: Treating Provider/Extender: [name], [name] in Treatment: 10 Diagnosis Coding ICD-10 Codes Code Description E11.621 Type 2 diabetes mellitus with foot ulcer L97.512 Non-pressure chronic ulcer of other part of right foot with fat layer exposed I48.0 Paroxysmal atrial fibrillation Z79.01 Long term (current) use of anticoagulants I10 Essential (primary) hypertension I25.10 Atherosclerotic heart disease of native coronary artery without  angina pectoris Facility Procedures : CPT4 Code: [id number] Description: 11042 - DEB SUBQ TISSUE 20 SQ CM/< ICD-10 Diagnosis Description L97.512 Non-pressure chronic ulcer of other part of right foot with fat layer exposed Modifier: Quantity: 1 Physician Procedures : CPT4 Code Description Modifier [id number] 11042 - WC PHYS SUBQ TISS 20 SQ CM ICD-10 Diagnosis Description L97.512 Non-pressure chronic ulcer of other part of right foot with fat layer exposed Quantity: 1 Electronic Signature(s) Signed: 04/11/2023 4:31:15 PM By: Midge Aver MSN RN CNS WTA Signed: 04/12/2023 5:04:56 PM By: Allen Derry PA-C Previous Signature: 04/11/2023 4:30:30 PM Version By: Allen Derry PA-C Previous Signature: 04/11/2023 4:02:35 PM Version By: Allen Derry PA-C Entered By: Midge Aver on 04/11/2023 16:31:15

## 2023-04-11 NOTE — Progress Notes (Signed)
RALLY, SEGUR ([id number]) [id number] Page 1 of 12 Visit Report for 04/11/2023 Arrival Information Details Patient Name: Date of Service: [name] [facility] Main H. 04/11/2023 3:30 PM Medical Record Number: [id number] Patient Account Number: [id number] Date of Birth/Sex: Treating RN: 03/16/1951 (72 y.o. [name] Primary Care : Kerby Nora Other Clinician: Referring : Treating /Extender: [name], [name] in Treatment: 10 Visit Information History Since Last Visit All ordered tests and consults were completed: No Patient Arrived: [name] Added or deleted any medications: No Arrival Time: 15:19 Any new allergies or adverse reactions: No Accompanied By: self Had a fall or experienced change in No Transfer Assistance: None activities of daily living that may affect Patient Identification Verified: Yes risk of falls: Secondary Verification Process Completed: Yes Hospitalized since last visit: No Patient Requires Transmission-Based Precautions: No Has Dressing in Place as Prescribed: Yes Patient Has Alerts: Yes Pain Present Now: No Patient Alerts: Patient on Blood Thinner Diabetes type 2 Eliquis/Plavix ABI R 1.23 TBI 0.57 ABI L 0.98 TBI 0.80 Electronic Signature(s) Signed: 04/11/2023 4:30:06 PM By: Midge Aver MSN RN CNS WTA Entered By: Midge Aver on 04/11/2023 16:30:06 -------------------------------------------------------------------------------- Clinic Level of Care Assessment Details Patient Name: Date of Service: [name] [facility]. 04/11/2023 3:30 PM Medical Record Number: [id number] Patient Account Number: [id number] Date of Birth/Sex: Treating RN: 01/07/1951 (72 y.o. [name] Primary Care : Kerby Nora Other Clinician: Referring : Treating /Extender: [name], [name] in Treatment: 10 Clinic Level of Care Assessment Items TOOL 1 Quantity Score []  - 0 Use when  EandM and Procedure is performed on INITIAL visit ASSESSMENTS - Nursing Assessment / Reassessment []  - 0 General Physical Exam (combine w/ comprehensive assessment (listed just below) when performed on new pt. evals) []  - 0 Comprehensive Assessment (HX, ROS, Risk Assessments, Wounds Hx, etc.) [name], [name] ([id number]) [id number] Page 2 of 12 ASSESSMENTS - Wound and Skin Assessment / Reassessment []  - 0 Dermatologic / Skin Assessment (not related to wound area) ASSESSMENTS - Ostomy and/or Continence Assessment and Care []  - 0 Incontinence Assessment and Management []  - 0 Ostomy Care Assessment and Management (repouching, etc.) PROCESS - Coordination of Care []  - 0 Simple Patient / Family Education for ongoing care []  - 0 Complex (extensive) Patient / Family Education for ongoing care []  - 0 Staff obtains Chiropractor, Records, T Results / Process Orders est []  - 0 Staff telephones HHA, Nursing Homes / Clarify orders / etc []  - 0 Routine Transfer to another Facility (non-emergent condition) []  - 0 Routine Hospital Admission (non-emergent condition) []  - 0 New Admissions / Manufacturing engineer / Ordering NPWT Apligraf, etc. , []  - 0 Emergency Hospital Admission (emergent condition) PROCESS - Special Needs []  - 0 Pediatric / Minor Patient Management []  - 0 Isolation Patient Management []  - 0 Hearing / Language / Visual special needs []  - 0 Assessment of Community assistance (transportation, D/C planning, etc.) []  - 0 Additional assistance / Altered mentation []  - 0 Support Surface(s) Assessment (bed, cushion, seat, etc.) INTERVENTIONS - Miscellaneous []  - 0 External ear exam []  - 0 Patient Transfer (multiple staff / Nurse, adult / Similar devices) []  - 0 Simple Staple / Suture removal (25 or less) []  - 0 Complex Staple / Suture removal (26 or more) []  - 0 Hypo/Hyperglycemic Management (do not check if billed separately) []  - 0 Ankle /  Brachial Index (ABI) - do not check if billed separately Has the patient been seen at the  hospital within the last three years: Yes Total Score: 0 Level Of Care: ____ Electronic Signature(s) Signed: 04/11/2023 4:57:03 PM By: Midge Aver MSN RN CNS WTA Entered By: Midge Aver on 04/11/2023 16:31:09 -------------------------------------------------------------------------------- Encounter Discharge Information Details Patient Name: Date of Service: [name] [facility] H. 04/11/2023 3:30 PM Medical Record Number: [id number] Patient Account Number: [id number] Date of Birth/Sex: Treating RN: Mar 13, 1951 (72 y.o. [name] Primary Care : Kerby Nora Other Clinician: Referring : Treating /Extender: [name], [name] in Treatment: [address] ([id number]) [id number] Page 3 of 12 Encounter Discharge Information Items Post Procedure Vitals Discharge Condition: Stable Temperature (F): 98.2 Ambulatory Status: Cane Pulse (bpm): 79 Discharge Destination: Home Respiratory Rate (breaths/min): 18 Transportation: Private Auto Blood Pressure (mmHg): 127/73 Accompanied By: self Schedule Follow-up Appointment: Yes Clinical Summary of Care: Electronic Signature(s) Signed: 04/11/2023 4:55:20 PM By: Midge Aver MSN RN CNS WTA Previous Signature: 04/11/2023 4:34:08 PM Version By: Midge Aver MSN RN CNS WTA Entered By: Midge Aver on 04/11/2023 16:55:20 -------------------------------------------------------------------------------- Lower Extremity Assessment Details Patient Name: Date of Service: [name] [facility] H. 04/11/2023 3:30 PM Medical Record Number: [id number] Patient Account Number: [id number] Date of Birth/Sex: Treating RN: 12-May-1951 (72 y.o. [name] Primary Care : Kerby Nora Other Clinician: Referring : Treating /Extender: [name], [name] in Treatment: 10 Edema  Assessment Assessed: [Left: No] [Right: No] Edema: [Left: N] [Right: o] Calf Left: Right: Point of Measurement: 30 cm From Medial Instep 40 cm Ankle Left: Right: Point of Measurement: 18 cm From Medial Instep 24 cm Knee To Floor Left: Right: From Medial Instep 40 cm Vascular Assessment Pulses: Dorsalis Pedis Palpable: [Right:Yes] Extremity colors, hair growth, and conditions: Extremity Color: [Right:Normal] Hair Growth on Extremity: [Right:Yes] Temperature of Extremity: [Right:Warm] Capillary Refill: [Right:< 3 seconds] Dependent Rubor: [Right:No] Blanched when Elevated: [Right:No No] Toe Nail Assessment Left: Right: Thick: Yes Discolored: IVARS, BELTRAMO ([id number]) [id number] Page 4 of 12 Deformed: Yes Improper Length and Hygiene: Yes Electronic Signature(s) Signed: 04/11/2023 4:57:03 PM By: Midge Aver MSN RN CNS WTA Entered By: Midge Aver on 04/11/2023 15:37:07 -------------------------------------------------------------------------------- Multi Wound Chart Details Patient Name: Date of Service: [name] [facility] H. 04/11/2023 3:30 PM Medical Record Number: [id number] Patient Account Number: [id number] Date of Birth/Sex: Treating RN: 12/24/1950 (72 y.o. [name] Primary Care : Kerby Nora Other Clinician: Referring : Treating /Extender: [name], [name] in Treatment: 10 Vital Signs Height(in): 66 Pulse(bpm): 79 Weight(lbs): 250 Blood Pressure(mmHg): 127/73 Body Mass Index(BMI): 40.3 Temperature(F): 98.2 Respiratory Rate(breaths/min): 18 [1:Photos:] Right Calcaneus Right, Distal, Dorsal Foot Right, Proximal, Dorsal Foot Wound Location: Gradually Appeared Gradually Appeared Gradually Appeared Wounding Event: Diabetic Wound/Ulcer of the Lower Diabetic Wound/Ulcer of the Lower Diabetic Wound/Ulcer of the Lower Primary Etiology: Extremity Extremity Extremity Congestive Heart Failure,  Coronary Congestive Heart Failure, Coronary Congestive Heart Failure, Coronary Comorbid History: Artery Disease, Hypertension, Type II Artery Disease, Hypertension, Type II Artery Disease, Hypertension, Type II Diabetes, Neuropathy Diabetes, Neuropathy Diabetes, Neuropathy 12/10/2022 12/10/2022 12/10/2022 Date Acquired: 10 10 10  Weeks of Treatment: Open Open Open Wound Status: No No No Wound Recurrence: Yes No No Clustered Wound: 3x0.8x0.4 1.2x1x0.3 1.5x1.7x0.3 Measurements L x W x D (cm) 1.885 0.942 2.003 A (cm) : rea 0.754 0.283 0.601 Volume (cm) : 55.90% -9.00% 20.30% % Reduction in A rea: -76.60% -229.10% -139.40% % Reduction in Volume: Grade 1 Grade 1 Grade 1 Classification: Medium Medium Medium Exudate A mount: Serosanguineous Serosanguineous Serosanguineous Exudate Type: red,  brown red, brown red, brown Exudate Color: Medium (34-66%) None Present (0%) None Present (0%) Granulation A mount: Red N/A N/A Granulation Quality: None Present (0%) None Present (0%) Large (67-100%) Necrotic A mount: Fat Layer (Subcutaneous Tissue): Yes Fat Layer (Subcutaneous Tissue): Yes Fat Layer (Subcutaneous Tissue): Yes Exposed Structures: Fascia: No Fascia: No Fascia: No Tendon: No Tendon: No Tendon: No Muscle: No Muscle: No Muscle: No Joint: No Joint: No Joint: No Bone: No Bone: No Bone: No GIANMARCO, SETHER ([id number]) [id number] Page 5 of 12 None Small (1-33%) Small (1-33%) Epithelialization: Treatment Notes Electronic Signature(s) Signed: 04/11/2023 4:57:03 PM By: Midge Aver MSN RN CNS WTA Entered By: Midge Aver on 04/11/2023 15:55:35 -------------------------------------------------------------------------------- Multi-Disciplinary Care Plan Details Patient Name: Date of Service: [name] HN H. 04/11/2023 3:30 PM Medical Record Number: [id number] Patient Account Number: [id number] Date of Birth/Sex: Treating RN: 1951/06/16 (72 y.o.  [name] Primary Care : Kerby Nora Other Clinician: Referring : Treating /Extender: [name], [name] in Treatment: 10 Active Inactive Necrotic Tissue Nursing Diagnoses: Impaired tissue integrity related to necrotic/devitalized tissue Knowledge deficit related to management of necrotic/devitalized tissue Goals: Necrotic/devitalized tissue will be minimized in the wound bed Date Initiated: 02/01/2023 Date Inactivated: 02/28/2023 Target Resolution Date: 03/04/2023 Goal Status: Met Patient/caregiver will verbalize understanding of reason and process for debridement of necrotic tissue Date Initiated: 02/01/2023 Target Resolution Date: 04/25/2023 Goal Status: Active Interventions: Assess patient pain level pre-, during and post procedure and prior to discharge Provide education on necrotic tissue and debridement process Treatment Activities: Apply topical anesthetic as ordered : 01/31/2023 Notes: Wound/Skin Impairment Nursing Diagnoses: Impaired tissue integrity Knowledge deficit related to ulceration/compromised skin integrity Goals: Patient/caregiver will verbalize understanding of skin care regimen Date Initiated: 02/01/2023 Date Inactivated: 02/28/2023 Target Resolution Date: 03/04/2023 Goal Status: Met Ulcer/skin breakdown will have a volume reduction of 30% by week 4 Date Initiated: 02/01/2023 Date Inactivated: 02/28/2023 Target Resolution Date: 03/04/2023 Goal Status: Met Ulcer/skin breakdown will have a volume reduction of 50% by week 8 Date Initiated: 02/01/2023 Date Inactivated: 04/11/2023 Target Resolution Date: 04/04/2023 Goal Status: Met Ulcer/skin breakdown will have a volume reduction of 80% by week [address] ([id number]) [id number] Page 6 of 12 Date Initiated: 02/01/2023 Target Resolution Date: 05/05/2023 Goal Status: Active Interventions: Assess patient/caregiver ability to obtain necessary  supplies Assess patient/caregiver ability to perform ulcer/skin care regimen upon admission and as needed Assess ulceration(s) every visit Provide education on ulcer and skin care Treatment Activities: Referred to DME  for dressing supplies : 01/31/2023 Skin care regimen initiated : 01/31/2023 Notes: Electronic Signature(s) Signed: 04/11/2023 4:31:32 PM By: Midge Aver MSN RN CNS WTA Entered By: Midge Aver on 04/11/2023 16:31:32 -------------------------------------------------------------------------------- Pain Assessment Details Patient Name: Date of Service: [name] HN H. 04/11/2023 3:30 PM Medical Record Number: [id number] Patient Account Number: [id number] Date of Birth/Sex: Treating RN: 30-Jan-1951 (72 y.o. [name] Primary Care : Kerby Nora Other Clinician: Referring : Treating /Extender: [name], [name] in Treatment: 10 Active Problems Location of Pain Severity and Description of Pain Patient Has Paino No Site Locations Pain Management and Medication Current Pain Management: Electronic Signature(s) Signed: 04/11/2023 4:57:03 PM By: Midge Aver MSN RN CNS WTA Entered By: Midge Aver on 04/11/2023 15:20:55 [name] ([id number]) [id number] Page 7 of 12 -------------------------------------------------------------------------------- Patient/Caregiver Education Details Patient Name: Date of Service: [name] 8/13/2024andnbsp3:30 PM Medical Record Number: [id number] Patient Account Number: [id number] Date of Birth/Gender: Treating RN: [birth year] [phone]  y.o. [name] Primary Care Physician: Kerby Nora Other Clinician: Referring Physician: Treating Physician/Extender: [name], [name] in Treatment: 10 Education Assessment Education Provided To: Patient Education Topics Provided Wound Debridement: Handouts: Wound Debridement Methods:  Explain/Verbal Responses: State content correctly Wound/Skin Impairment: Handouts: Caring for Your Ulcer Methods: Explain/Verbal Responses: State content correctly Electronic Signature(s) Signed: 04/11/2023 4:57:03 PM By: Midge Aver MSN RN CNS WTA Entered By: Midge Aver on 04/11/2023 16:31:48 -------------------------------------------------------------------------------- Wound Assessment Details Patient Name: Date of Service: [name] HN H. 04/11/2023 3:30 PM Medical Record Number: [id number] Patient Account Number: [id number] Date of Birth/Sex: Treating RN: 05/07/1951 (72 y.o. [name] Primary Care : Kerby Nora Other Clinician: Referring : Treating /Extender: [name], [name] in Treatment: 10 Wound Status Wound Number: 1 Primary Diabetic Wound/Ulcer of the Lower Extremity Etiology: Wound Location: Right Calcaneus Wound Open Wounding Event: Gradually Appeared Status: Date Acquired: 12/10/2022 Comorbid Congestive Heart Failure, Coronary Artery Disease, Weeks Of Treatment: 10 History: Hypertension, Type II Diabetes, Neuropathy Clustered Wound: GADGE, GRGURICH ([id number]) [id number] Page 8 of 12 Photos Wound Measurements Length: (cm) 1.5 Width: (cm) 0.8 Depth: (cm) 0.4 Area: (cm) 0.942 Volume: (cm) 0.377 % Reduction in Area: 78% % Reduction in Volume: 11.7% Epithelialization: None Wound Description Classification: Grade 1 Exudate Amount: Medium Exudate Type: Serosanguineous Exudate Color: red, brown Foul Odor After Cleansing: No Slough/Fibrino No Wound Bed Granulation Amount: Medium (34-66%) Exposed Structure Granulation Quality: Red Fascia Exposed: No Necrotic Amount: None Present (0%) Fat Layer (Subcutaneous Tissue) Exposed: Yes Tendon Exposed: No Muscle Exposed: No Joint Exposed: No Bone Exposed: No Treatment Notes Wound #1 (Calcaneus) Wound Laterality: Right Cleanser Soap  and Water Discharge Instruction: Gently cleanse wound with antibacterial soap, rinse and pat dry prior to dressing wounds Peri-Wound Care Topical Primary Dressing Prisma 4.34 (in) Discharge Instruction: Moisten w/normal saline or sterile water; Cover wound as directed. Do not remove from wound bed. Secondary Dressing (BORDER) Zetuvit Plus SILICONE BORDER Dressing 4x4 (in/in) Discharge Instruction: Please do not put silicone bordered dressings under wraps. Use non-bordered dressing only. Secured With Compression Wrap Compression Stockings Facilities manager) Signed: 04/11/2023 4:57:03 PM By: Midge Aver MSN RN CNS WTA Entered By: Midge Aver on 04/11/2023 15:58:00 [name] ([id number]) [id number] Page 9 of 12 -------------------------------------------------------------------------------- Wound Assessment Details Patient Name: Date of Service: [name] [facility] H. 04/11/2023 3:30 PM Medical Record Number: [id number] Patient Account Number: [id number] Date of Birth/Sex: Treating RN: 20-Dec-1950 (72 y.o. [name] Primary Care : Kerby Nora Other Clinician: Referring : Treating /Extender: [name], [name] in Treatment: 10 Wound Status Wound Number: 2 Primary Diabetic Wound/Ulcer of the Lower Extremity Etiology: Wound Location: Right, Distal, Dorsal Foot Wound Open Wounding Event: Gradually Appeared Status: Date Acquired: 12/10/2022 Comorbid Congestive Heart Failure, Coronary Artery Disease, Weeks Of Treatment: 10 History: Hypertension, Type II Diabetes, Neuropathy Clustered Wound: No Photos Wound Measurements Length: (cm) 1.2 Width: (cm) 1 Depth: (cm) 0.3 Area: (cm) 0.942 Volume: (cm) 0.283 % Reduction in Area: -9% % Reduction in Volume: -229.1% Epithelialization: Small (1-33%) Wound Description Classification: Grade 1 Exudate Amount: Medium Exudate Type: Serosanguineous Exudate  Color: red, brown Foul Odor After Cleansing: No Slough/Fibrino No Wound Bed Granulation Amount: None Present (0%) Exposed Structure Necrotic Amount: None Present (0%) Fascia Exposed: No Fat Layer (Subcutaneous Tissue) Exposed: Yes Tendon Exposed: No Muscle Exposed: No Joint Exposed: No Bone Exposed: No Treatment Notes Wound #2 (Foot) Wound Laterality: Dorsal, Right, Distal Cleanser Soap and Water  Discharge Instruction: Gently cleanse wound with antibacterial soap, rinse and pat dry prior to dressing wounds Peri-Wound Care LARNEY, SEACHRIST ([id number]) [id number] Page 10 of 12 Topical Primary Dressing Prisma 4.34 (in) Discharge Instruction: Moisten w/normal saline or sterile water; Cover wound as directed. Do not remove from wound bed. Secondary Dressing (BORDER) Zetuvit Plus SILICONE BORDER Dressing 4x4 (in/in) Discharge Instruction: Please do not put silicone bordered dressings under wraps. Use non-bordered dressing only. Secured With Compression Wrap Compression Stockings Facilities manager) Signed: 04/11/2023 4:57:03 PM By: Midge Aver MSN RN CNS WTA Entered By: Midge Aver on 04/11/2023 15:32:17 -------------------------------------------------------------------------------- Wound Assessment Details Patient Name: Date of Service: [name] HN H. 04/11/2023 3:30 PM Medical Record Number: [id number] Patient Account Number: [id number] Date of Birth/Sex: Treating RN: 02-17-1951 (72 y.o. [name] Primary Care : Kerby Nora Other Clinician: Referring : Treating /Extender: [name], [name] in Treatment: 10 Wound Status Wound Number: 3 Primary Diabetic Wound/Ulcer of the Lower Extremity Etiology: Wound Location: Right, Proximal, Dorsal Foot Wound Open Wounding Event: Gradually Appeared Status: Date Acquired: 12/10/2022 Comorbid Congestive Heart Failure, Coronary Artery Disease, Weeks Of  Treatment: 10 History: Hypertension, Type II Diabetes, Neuropathy Clustered Wound: No Photos Wound Measurements Length: (cm) 1.5 Width: (cm) 1.7 Depth: (cm) 0.3 Area: (cm) 2.003 Volume: (cm) 0.601 % Reduction in Area: 20.3% % Reduction in Volume: -139.4% Epithelialization: Small (1-33%) Wound Description JODY, HARTKOPF ([id number]) Classification: Grade 1 Exudate Amount: Medium Exudate Type: Serosanguineous Exudate Color: red, brown [id number] Page 11 of 12 Foul Odor After Cleansing: No Slough/Fibrino Yes Wound Bed Granulation Amount: None Present (0%) Exposed Structure Necrotic Amount: Large (67-100%) Fascia Exposed: No Necrotic Quality: Adherent Slough Fat Layer (Subcutaneous Tissue) Exposed: Yes Tendon Exposed: No Muscle Exposed: No Joint Exposed: No Bone Exposed: No Treatment Notes Wound #3 (Foot) Wound Laterality: Dorsal, Right, Proximal Cleanser Soap and Water Discharge Instruction: Gently cleanse wound with antibacterial soap, rinse and pat dry prior to dressing wounds Peri-Wound Care Topical Primary Dressing Prisma 4.34 (in) Discharge Instruction: Moisten w/normal saline or sterile water; Cover wound as directed. Do not remove from wound bed. Secondary Dressing (BORDER) Zetuvit Plus SILICONE BORDER Dressing 4x4 (in/in) Discharge Instruction: Please do not put silicone bordered dressings under wraps. Use non-bordered dressing only. Secured With Compression Wrap Compression Stockings Facilities manager) Signed: 04/11/2023 4:57:03 PM By: Midge Aver MSN RN CNS WTA Entered By: Midge Aver on 04/11/2023 15:32:57 -------------------------------------------------------------------------------- Vitals Details Patient Name: Date of Service: [name] HN H. 04/11/2023 3:30 PM Medical Record Number: [id number] Patient Account Number: [id number] Date of Birth/Sex: Treating RN: August 15, 1951 (72 y.o. [name] Primary Care  : Kerby Nora Other Clinician: Referring : Treating /Extender: [name], [name] in Treatment: 10 Vital Signs Time Taken: 15:20 Temperature (F): 98.2 Height (in): 66 Pulse (bpm): 79 Weight (lbs): 250 Respiratory Rate (breaths/min): 18 Body Mass Index (BMI): 40.3 Blood Pressure (mmHg): 127/73 Reference Range: 80 - 120 mg / dl RAJVIR, BYRDSONG ([id number]) [id number] Page 12 of 12 Electronic Signature(s) Signed: 04/11/2023 4:57:03 PM By: Midge Aver MSN RN CNS WTA Entered By: Midge Aver on 04/11/2023 15:20:45

## 2023-04-12 DIAGNOSIS — E11621 Type 2 diabetes mellitus with foot ulcer: Secondary | ICD-10-CM | POA: Diagnosis not present

## 2023-04-12 NOTE — Telephone Encounter (Signed)
I have also sent Zenia Resides with [employer] a Community Message through Epic to review order and office to.  I ask for her to contact us if there is anything else needed.

## 2023-04-13 ENCOUNTER — Encounter (INDEPENDENT_AMBULATORY_CARE_PROVIDER_SITE_OTHER): Payer: Self-pay

## 2023-04-13 NOTE — Telephone Encounter (Signed)
Per [employer] from [name]:  I reviewed the note and it looks good. Team member will be faxing forms for signature.

## 2023-04-17 ENCOUNTER — Other Ambulatory Visit (HOSPITAL_COMMUNITY): Payer: Self-pay

## 2023-04-17 NOTE — Progress Notes (Signed)
Paramedicine Encounter    Patient ID: [name], male    DOB: 1951/07/30, 72 y.o.   MRN: [id number]   Complaints-none   Edema-none   Compliance with meds-yes  Pill box filled-n/a If so, by whom-n/a  Pill bottles checked.   Refills needed-none-pharmacy handles that he said    Pt was given application for xarelto and if not eligible for that then they would consider warfarin-however that would mean more doc appointments for INR check and likely a lot of med changes which I think would cause more stress on him. Looks like he is in need to meet 4% of income to qualify-he couldn't meet the 3% for eliquis so he wont be able to meet the 4%.   Word around is that medicare is suppose to do away with the donut hole next year and eliquis should be affordable for him next year.  Clinic advised that samples would be ok until then.   He has 2 more wks of meds of the eliquis.   PCP is still working on motorized wheelchair.  Pt does not use pill box.   Pt reports since the procedure to clear blockages in his leg he has noticed improvement in his wounds to his toes.   He denies increased sob, no dizziness, no c/p.    BP 102/60   Pulse 82   Resp 16   Wt 249 lb (112.9 kg)   SpO2 98%   BMI 40.19 kg/m  his b/p tends to run on low side.   Weight yesterday-248  Last visit weight-253 @ PCP office   Patient Care Team: [name], MD as PCP - General Mariah Milling Tollie Pizza, MD as PCP - Cardiology (Cardiology) Antonieta Iba, MD as Consulting Physician (Cardiology) Kathyrn Sheriff, [facility] (Inactive) as Pharmacist (Pharmacist)  Patient Active Problem List   Diagnosis Date Noted   PAD (peripheral artery disease) (HCC) 04/11/2023   Leg weakness, bilateral 04/11/2023   SOB (shortness of breath) 03/03/2023   Atrial fibrillation (HCC) 02/22/2023   Alteration in mobility associated with pain 01/12/2023   Diabetic ulcer of ankle (HCC) 01/12/2023   Peripheral neuropathy 01/12/2023    NSTEMI (non-ST elevated myocardial infarction) (HCC) 11/05/2022   Penis disorder 05/16/2022   Other fatigue 05/16/2022   Rash 05/12/2022   GAD (generalized anxiety disorder) 09/21/2021   Encounter for power mobility device assessment 05/13/2021   Statin myopathy 03/30/2021   Chronic midline low back pain 11/12/2020   Tinea corporis 04/10/2018   ED (erectile dysfunction) 03/06/2018   Acute on chronic diastolic CHF (congestive heart failure) (HCC) 09/20/2016   Adjustment disorder with mixed anxiety and depressed mood 09/09/2016   Gastroesophageal reflux disease 09/09/2016   Elevated left ventricular end-diastolic pressure (LVEDP) 03/11/2016   Anemia 03/11/2016   Hypertensive heart disease with heart failure (HCC)    Diabetes mellitus type 2 with retinopathy (HCC)    Coronary artery disease of native artery of native heart with stable angina pectoris (HCC)    Morbid obesity (HCC)    Chronic chest pain    Diabetic retinopathy (HCC) 09/23/2014   Snoring 12/21/2012   HYPOGONADISM 09/28/2010   B12 deficiency 09/28/2010   Vitamin D deficiency 09/28/2010   OTHER MALAISE AND FATIGUE 09/17/2010   TRIGGER FINGER, RIGHT MIDDLE 09/14/2010   BRANCH RETINAL VEIN OCCLUSION 11/26/2009   CONSTIPATION 08/10/2009   GASTRITIS 07/29/2009   Major depressive disorder, recurrent episode, moderate (HCC) 07/06/2007   RENAL CALCULUS, HX OF 03/26/2007   Hyperlipidemia associated  with type 2 diabetes mellitus (HCC) 12/05/2006   Hypertension associated with diabetes (HCC) 12/05/2006   Osteoarthritis 12/05/2006    Current Outpatient Medications:    Acetaminophen (TYLENOL PO), Take 3-4 tablets by mouth as needed (pain)., Disp: , Rfl:    apixaban (ELIQUIS) 5 MG TABS tablet, Take 1 tablet (5 mg total) by mouth 2 (two) times daily., Disp: 56 tablet, Rfl: 0   carvedilol (COREG) 12.5 MG tablet, Take 1 tablet (12.5 mg total) by mouth 2 (two) times daily with a meal., Disp: 180 tablet, Rfl: 3   cholecalciferol  (VITAMIN D3) 25 MCG (1000 UNIT) tablet, Take 2,000 Units by mouth daily., Disp: , Rfl:    clopidogrel (PLAVIX) 75 MG tablet, TAKE ONE TABLET BY MOUTH ONCE DAILY WITH BREAKFAST, Disp: 90 tablet, Rfl: 3   Continuous Blood Gluc Sensor (FREESTYLE LIBRE 2 SENSOR) MISC, APPLY SENSOR EVERY 14 DAYS TO MONITOR SUGAR CONTINOUSLY, Disp: 2 each, Rfl: 11   Continuous Glucose Receiver (FREESTYLE LIBRE 2 READER) DEVI, USE WITH SENSORS TO MONITOR SUGAR CONTINUOUSLY, Disp: 1 each, Rfl: 0   Dulaglutide (TRULICITY) 4.5 MG/0.5ML SOPN, Inject 4.5 mg into the skin once a week. Via [name] PAP, Disp: , Rfl:    empagliflozin (JARDIANCE) 10 MG TABS tablet, Take 1 tablet (10 mg total) by mouth daily., Disp: 30 tablet, Rfl: 5   ENTRESTO 24-26 MG, TAKE ONE TABLET BY MOUTH TWO (TWO) TIMES DAILY., Disp: 60 tablet, Rfl: 3   ezetimibe (ZETIA) 10 MG tablet, TAKE ONE TABLET BY MOUTH ONCE DAILY, Disp: 30 tablet, Rfl: 2   furosemide (LASIX) 40 MG tablet, Take 40 mg by mouth daily., Disp: , Rfl:    hydrOXYzine (ATARAX) 10 MG tablet, TAKE 1 TABLET BY MOUTH 3 TIMES DAILY AS NEEDED FOR ANXIETY, Disp: 30 tablet, Rfl: 2   insulin aspart (NOVOLOG FLEXPEN) 100 UNIT/ML FlexPen, Inject 13 Units into the skin every morning., Disp: , Rfl:    isosorbide mononitrate (IMDUR) 30 MG 24 hr tablet, Take 1 tablet (30 mg total) by mouth 2 (two) times daily., Disp: 60 tablet, Rfl: 2   spironolactone (ALDACTONE) 25 MG tablet, Take 1 tablet (25 mg total) by mouth daily., Disp: 30 tablet, Rfl: 5   TRESIBA FLEXTOUCH 100 UNIT/ML FlexTouch Pen, INJECT 50 UNITS INTO THE SKIN DAILY, Disp: 15 mL, Rfl: 2   TRUEPLUS 5-BEVEL PEN NEEDLES 31G X 6 MM MISC, USE TO INJECT INSULIN THREE TIMES A DAY, Disp: 300 each, Rfl: 3   venlafaxine XR (EFFEXOR-XR) 150 MG 24 hr capsule, TAKE ONE CAPSULE BY MOUTH DAILY WITH BREAKFAST. TAKE WITH EFFEXOR XR 75MG  FOR A TOTAL OF 225MG , Disp: 90 capsule, Rfl: 0   venlafaxine XR (EFFEXOR-XR) 75 MG 24 hr capsule, TAKE ONE CAPSULE BY MOUTH  ONCE DAILY. TAKE IN ADDITION TO THE 150 MG CAPSULE FOR A TOTAL DOSE OF 225MG  DAILY, Disp: 90 capsule, Rfl: 0   vitamin B-12 (CYANOCOBALAMIN) 1000 MCG tablet, Take 1,000 mcg by mouth daily., Disp: , Rfl:    HYDROcodone-acetaminophen (NORCO) 5-325 MG tablet, Take 1-2 tablets by mouth daily as needed for moderate pain., Disp: 60 tablet, Rfl: 0   HYDROcodone-acetaminophen (NORCO/VICODIN) 5-325 MG tablet, TAKE ONE (1) TO TWO (2) TABLETS BY MOUTH DAILY AS NEEDED FOR MODERATE PAIN. MAY, Disp: 60 tablet, Rfl: 0   HYDROcodone-acetaminophen (NORCO/VICODIN) 5-325 MG tablet, Take 1-2 tablets by mouth daily as needed for moderate pain., Disp: , Rfl:    nitroGLYCERIN (NITROSTAT) 0.4 MG SL tablet, DISSOLVE 1 TABLET UNDER TONGUE AS NEEDEDFOR CHEST  PAIN. MAY REPEAT 5 MINUTES APART 3 TIMES IF NEEDED, Disp: 25 tablet, Rfl: 3 Allergies  Allergen Reactions   Bupropion Nausea Only   Ozempic (0.25 Or 0.5 Mg-Dose) [Semaglutide(0.25 Or 0.5mg -Dos)] Nausea And Vomiting   Atorvastatin Other (See Comments)    Body aches Similar effect with rosuvastatin 40 mg twice weekly      Social History   Socioeconomic History   Marital status: Widowed    Spouse name: Not on file   Number of children: 3   Years of education: Not on file   Highest education level: 7th grade  Occupational History   Occupation: disabled    Associate Professor: UNEMPLOYED    Comment: back injury  Tobacco Use   Smoking status: Never   Smokeless tobacco: Never  Vaping Use   Vaping status: Never Used  Substance and Sexual Activity   Alcohol use: No    Alcohol/week: 0.0 standard drinks of alcohol   Drug use: No   Sexual activity: Not Currently  Other Topics Concern   Not on file  Social History Narrative   Has a roommate, Mr. [name]. No pets.   Social Determinants of Health   Financial Resource Strain: Low Risk  (11/23/2022)   Overall Financial Resource Strain (CARDIA)    Difficulty of Paying Living Expenses: Not hard at all  Food Insecurity:  No Food Insecurity (12/29/2022)   Hunger Vital Sign    Worried About Running Out of Food in the Last Year: Never true    Ran Out of Food in the Last Year: Never true  Transportation Needs: No Transportation Needs (12/29/2022)   PRAPARE - Administrator, Civil Service (Medical): No    Lack of Transportation (Non-Medical): No  Physical Activity: Inactive (11/09/2022)   Exercise Vital Sign    Days of Exercise per Week: 0 days    Minutes of Exercise per Session: 0 min  Stress: No Stress Concern Present (11/09/2022)   [employer] of Occupational Health - Occupational Stress Questionnaire    Feeling of Stress : Only a little  Social Connections: Moderately Isolated (11/09/2022)   Social Connection and Isolation Panel [NHANES]    Frequency of Communication with Friends and Family: More than three times a week    Frequency of Social Gatherings with Friends and Family: More than three times a week    Attends Religious Services: More than 4 times per year    Active Member of [employer] or Organizations: No    Attends Banker Meetings: Never    Marital Status: Widowed  Intimate Partner Violence: Not At Risk (11/09/2022)   Humiliation, Afraid, Rape, and Kick questionnaire    Fear of Current or Ex-Partner: No    Emotionally Abused: No    Physically Abused: No    Sexually Abused: No    Physical Exam      Future Appointments  Date Time Provider Department Center  04/20/2023  3:30 PM Allen Derry Bronx III, PA-C [facility] None  04/26/2023  2:30 PM [facility] 2 [facility] None  04/26/2023  3:30 PM Georgiana Spinner, NP [facility] None  05/09/2023  3:35 PM Charlsie Quest, NP [facility] None  05/18/2023  8:00 AM [facility] LAB [facility]  05/25/2023  2:40 PM [name], MD [facility]  06/13/2023  3:00 PM Delma Freeze, FNP [facility] None  11/14/2023 10:45 AM [facility] ANNUAL WELLNESS VISIT 1 [facility]       Kerry Hough, Paramedic [phone] [facility] Paramedic   04/17/23

## 2023-04-18 ENCOUNTER — Ambulatory Visit: Payer: PPO | Admitting: Cardiovascular Disease

## 2023-04-20 ENCOUNTER — Encounter: Payer: PPO | Admitting: Physician Assistant

## 2023-04-20 ENCOUNTER — Other Ambulatory Visit (INDEPENDENT_AMBULATORY_CARE_PROVIDER_SITE_OTHER): Payer: Self-pay | Admitting: Vascular Surgery

## 2023-04-20 DIAGNOSIS — L97512 Non-pressure chronic ulcer of other part of right foot with fat layer exposed: Secondary | ICD-10-CM | POA: Diagnosis not present

## 2023-04-20 DIAGNOSIS — Z9889 Other specified postprocedural states: Secondary | ICD-10-CM

## 2023-04-20 DIAGNOSIS — E11621 Type 2 diabetes mellitus with foot ulcer: Secondary | ICD-10-CM | POA: Diagnosis not present

## 2023-04-20 NOTE — Progress Notes (Signed)
[name], STROBLE ([id number]) [id number] Page 1 of 12 Visit Report for 04/20/2023 Arrival Information Details Patient Name: Date of Service: [name] 04/20/2023 2:45 PM Medical Record Number: [id number] Patient Account Number: [id number] Date of Birth/Sex: Treating RN: [birth year] (72 y.o. [name] Primary Care Azarius Lambson: Kerby Nora Other Clinician: Betha Loa Referring Nanetta Wiegman: Treating Lily Kernen/Extender: [name], [name] in Treatment: 11 Visit Information History Since Last Visit All ordered tests and consults were completed: No Patient Arrived: Ambulatory Added or deleted any medications: No Arrival Time: 14:56 Any new allergies or adverse reactions: No Transfer Assistance: None Had a fall or experienced change in No Patient Identification Verified: Yes activities of daily living that may affect Secondary Verification Process Completed: Yes risk of falls: Patient Requires Transmission-Based Precautions: No Signs or symptoms of abuse/neglect since last visito No Patient Has Alerts: Yes Hospitalized since last visit: No Patient Alerts: Patient on Blood Thinner Implantable device outside of the clinic excluding No Diabetes type 2 cellular tissue based products placed in the center Eliquis/Plavix since last visit: ABI R 1.23 TBI 0.57 Has Dressing in Place as Prescribed: Yes ABI L 0.98 TBI 0.80 Pain Present Now: No Electronic Signature(s) Signed: 04/20/2023 5:01:40 PM By: Betha Loa Entered By: Betha Loa on 04/20/2023 12:00:44 -------------------------------------------------------------------------------- Clinic Level of Care Assessment Details Patient Name: Date of Service: [name] 04/20/2023 2:45 PM Medical Record Number: [id number] Patient Account Number: [id number] Date of Birth/Sex: Treating RN: 1951/05/04 (72 y.o. [name] Primary Care Markhi Kleckner: Kerby Nora Other Clinician:  Betha Loa Referring Brittannie Tawney: Treating Anylah Scheib/Extender: [name], [name] in Treatment: 11 Clinic Level of Care Assessment Items TOOL 1 Quantity Score []  - 0 Use when EandM and Procedure is performed on INITIAL visit ASSESSMENTS - Nursing Assessment / Reassessment []  - 0 General Physical Exam (combine w/ comprehensive assessment (listed just below) when performed on new pt. evals) []  - 0 Comprehensive Assessment (HX, ROS, Risk Assessments, Wounds Hx, etc.) [name], [name] ([id number]) [id number] Page 2 of 12 ASSESSMENTS - Wound and Skin Assessment / Reassessment []  - 0 Dermatologic / Skin Assessment (not related to wound area) ASSESSMENTS - Ostomy and/or Continence Assessment and Care []  - 0 Incontinence Assessment and Management []  - 0 Ostomy Care Assessment and Management (repouching, etc.) PROCESS - Coordination of Care []  - 0 Simple Patient / Family Education for ongoing care []  - 0 Complex (extensive) Patient / Family Education for ongoing care []  - 0 Staff obtains Chiropractor, Records, T Results / Process Orders est []  - 0 Staff telephones HHA, Nursing Homes / Clarify orders / etc []  - 0 Routine Transfer to another Facility (non-emergent condition) []  - 0 Routine Hospital Admission (non-emergent condition) []  - 0 New Admissions / Manufacturing engineer / Ordering NPWT Apligraf, etc. , []  - 0 Emergency Hospital Admission (emergent condition) PROCESS - Special Needs []  - 0 Pediatric / Minor Patient Management []  - 0 Isolation Patient Management []  - 0 Hearing / Language / Visual special needs []  - 0 Assessment of Community assistance (transportation, D/C planning, etc.) []  - 0 Additional assistance / Altered mentation []  - 0 Support Surface(s) Assessment (bed, cushion, seat, etc.) INTERVENTIONS - Miscellaneous []  - 0 External ear exam []  - 0 Patient Transfer (multiple staff / Nurse, adult / Similar devices) []  -  0 Simple Staple / Suture removal (25 or less) []  - 0 Complex Staple / Suture removal (26 or more) []  - 0 Hypo/Hyperglycemic Management (do not check  if billed separately) []  - 0 Ankle / Brachial Index (ABI) - do not check if billed separately Has the patient been seen at the hospital within the last three years: Yes Total Score: 0 Level Of Care: ____ Electronic Signature(s) Signed: 04/20/2023 5:01:40 PM By: Betha Loa Entered By: Betha Loa on 04/20/2023 12:29:45 -------------------------------------------------------------------------------- Encounter Discharge Information Details Patient Name: Date of Service: William Albright HN H. 04/20/2023 2:45 PM Medical Record Number: [id number] Patient Account Number: [id number] Date of Birth/Sex: Treating RN: August 14, 1951 (72 y.o. [name] Primary Care March Joos: Kerby Nora Other Clinician: Betha Loa Referring Ahmad Vanwey: Treating Shefali Ng/Extender: Dillinger, Ruvalcaba ([id number]) [id number] Page 3 of 12 Weeks in Treatment: 11 Encounter Discharge Information Items Post Procedure Vitals Discharge Condition: Stable Temperature (F): 97.6 Ambulatory Status: Ambulatory Pulse (bpm): 76 Discharge Destination: Home Respiratory Rate (breaths/min): 18 Transportation: Private Auto Blood Pressure (mmHg): 105/80 Accompanied By: self Schedule Follow-up Appointment: Yes Clinical Summary of Care: Electronic Signature(s) Signed: 04/20/2023 5:01:40 PM By: Betha Loa Entered By: Betha Loa on 04/20/2023 13:55:59 -------------------------------------------------------------------------------- Lower Extremity Assessment Details Patient Name: Date of Service: [name] 04/20/2023 2:45 PM Medical Record Number: [id number] Patient Account Number: [id number] Date of Birth/Sex: Treating RN: 05/24/1951 (72 y.o. [name] Primary Care Indigo Chaddock: Kerby Nora Other  Clinician: Betha Loa Referring Jeni Duling: Treating Minda Faas/Extender: [name], [name] in Treatment: 11 Edema Assessment Assessed: [Left: No] [Right: Yes] Edema: [Left: Ye] [Right: s] Calf Left: Right: Point of Measurement: 30 cm From Medial Instep 39.5 cm Ankle Left: Right: Point of Measurement: 18 cm From Medial Instep 25 cm Knee To Floor Left: Right: From Medial Instep 40 cm Vascular Assessment Pulses: Dorsalis Pedis Palpable: [Right:Yes] Electronic Signature(s) Signed: 04/20/2023 5:00:29 PM By: Angelina Pih Signed: 04/20/2023 5:01:40 PM By: Betha Loa Entered By: Betha Loa on 04/20/2023 12:19:30 [name] ([id number]) [id number] Page 4 of 12 -------------------------------------------------------------------------------- Multi Wound Chart Details Patient Name: Date of Service: [name] 04/20/2023 2:45 PM Medical Record Number: [id number] Patient Account Number: [id number] Date of Birth/Sex: Treating RN: 04-Jan-1951 (72 y.o. [name] Primary Care Shad Ledvina: Kerby Nora Other Clinician: Betha Loa Referring Jaryn Rosko: Treating Endre Coutts/Extender: [name], [name] in Treatment: 11 Vital Signs Height(in): 66 Pulse(bpm): 76 Weight(lbs): 250 Blood Pressure(mmHg): 105/80 Body Mass Index(BMI): 40.3 Temperature(F): 97.6 Respiratory Rate(breaths/min): 18 [1:Photos:] Right Calcaneus Right, Distal, Dorsal Foot Right, Proximal, Dorsal Foot Wound Location: Gradually Appeared Gradually Appeared Gradually Appeared Wounding Event: Diabetic Wound/Ulcer of the Lower Diabetic Wound/Ulcer of the Lower Diabetic Wound/Ulcer of the Lower Primary Etiology: Extremity Extremity Extremity Congestive Heart Failure, Coronary Congestive Heart Failure, Coronary Congestive Heart Failure, Coronary Comorbid History: Artery Disease, Hypertension, Type II Artery Disease, Hypertension, Type II Artery  Disease, Hypertension, Type II Diabetes, Neuropathy Diabetes, Neuropathy Diabetes, Neuropathy 12/10/2022 12/10/2022 12/10/2022 Date Acquired: 11 11 11  Weeks of Treatment: Open Open Open Wound Status: No No No Wound Recurrence: Yes No No Clustered Wound: 0.8x0.4x0.3 0.9x1.2x0.2 1x1x0.1 Measurements L x W x D (cm) 0.251 0.848 0.785 A (cm) : rea 0.075 0.17 0.079 Volume (cm) : 94.10% 1.90% 68.80% % Reduction in A rea: 82.40% -97.70% 68.50% % Reduction in Volume: Grade 1 Grade 1 Grade 1 Classification: Medium Medium Medium Exudate A mount: Serosanguineous Serosanguineous Serosanguineous Exudate Type: red, brown red, brown red, brown Exudate Color: Medium (34-66%) None Present (0%) None Present (0%) Granulation A mount: Red N/A N/A Granulation Quality: None Present (0%) None Present (0%) Large (67-100%) Necrotic A mount:  Fat Layer (Subcutaneous Tissue): Yes Fat Layer (Subcutaneous Tissue): Yes Fat Layer (Subcutaneous Tissue): Yes Exposed Structures: Fascia: No Fascia: No Fascia: No Tendon: No Tendon: No Tendon: No Muscle: No Muscle: No Muscle: No Joint: No Joint: No Joint: No Bone: No Bone: No Bone: No None Small (1-33%) Small (1-33%) Epithelialization: Treatment Notes Electronic Signature(s) Signed: 04/20/2023 5:01:40 PM By: Betha Loa Entered By: Betha Loa on 04/20/2023 12:19:38 [name] ([id number]) [id number] Page 5 of 12 -------------------------------------------------------------------------------- Multi-Disciplinary Care Plan Details Patient Name: Date of Service: [name] 04/20/2023 2:45 PM Medical Record Number: [id number] Patient Account Number: [id number] Date of Birth/Sex: Treating RN: 1951/07/08 (72 y.o. [name] Primary Care Trayvond Viets: Kerby Nora Other Clinician: Betha Loa Referring Terence Googe: Treating Ilianna Bown/Extender: [name], [name] in Treatment: 11 Active  Inactive Necrotic Tissue Nursing Diagnoses: Impaired tissue integrity related to necrotic/devitalized tissue Knowledge deficit related to management of necrotic/devitalized tissue Goals: Necrotic/devitalized tissue will be minimized in the wound bed Date Initiated: 02/01/2023 Date Inactivated: 02/28/2023 Target Resolution Date: 03/04/2023 Goal Status: Met Patient/caregiver will verbalize understanding of reason and process for debridement of necrotic tissue Date Initiated: 02/01/2023 Target Resolution Date: 04/25/2023 Goal Status: Active Interventions: Assess patient pain level pre-, during and post procedure and prior to discharge Provide education on necrotic tissue and debridement process Treatment Activities: Apply topical anesthetic as ordered : 01/31/2023 Notes: Wound/Skin Impairment Nursing Diagnoses: Impaired tissue integrity Knowledge deficit related to ulceration/compromised skin integrity Goals: Patient/caregiver will verbalize understanding of skin care regimen Date Initiated: 02/01/2023 Date Inactivated: 02/28/2023 Target Resolution Date: 03/04/2023 Goal Status: Met Ulcer/skin breakdown will have a volume reduction of 30% by week 4 Date Initiated: 02/01/2023 Date Inactivated: 02/28/2023 Target Resolution Date: 03/04/2023 Goal Status: Met Ulcer/skin breakdown will have a volume reduction of 50% by week 8 Date Initiated: 02/01/2023 Date Inactivated: 04/11/2023 Target Resolution Date: 04/04/2023 Goal Status: Met Ulcer/skin breakdown will have a volume reduction of 80% by week 12 Date Initiated: 02/01/2023 Target Resolution Date: 05/05/2023 Goal Status: Active Interventions: Assess patient/caregiver ability to obtain necessary supplies Assess patient/caregiver ability to perform ulcer/skin care regimen upon admission and as needed Assess ulceration(s) every visit Provide education on ulcer and skin care Treatment Activities: [name], [name] ([id number]) [id number]  Page 6 of 12 Referred to DME Sharita Bienaime for dressing supplies : 01/31/2023 Skin care regimen initiated : 01/31/2023 Notes: Electronic Signature(s) Signed: 04/20/2023 5:00:29 PM By: Angelina Pih Signed: 04/20/2023 5:01:40 PM By: Betha Loa Entered By: Betha Loa on 04/20/2023 12:30:01 -------------------------------------------------------------------------------- Pain Assessment Details Patient Name: Date of Service: William Albright HN H. 04/20/2023 2:45 PM Medical Record Number: [id number] Patient Account Number: [id number] Date of Birth/Sex: Treating RN: 1951-02-04 (72 y.o. [name] Primary Care Xavion Muscat: Kerby Nora Other Clinician: Betha Loa Referring Yaelis Scharfenberg: Treating Brandyn Thien/Extender: [name], [name] in Treatment: 11 Active Problems Location of Pain Severity and Description of Pain Patient Has Paino No Site Locations Pain Management and Medication Current Pain Management: Electronic Signature(s) Signed: 04/20/2023 5:00:29 PM By: Angelina Pih Signed: 04/20/2023 5:01:40 PM By: Betha Loa Entered By: Betha Loa on 04/20/2023 12:05:44 [name] ([id number]) [id number] Page 7 of 12 -------------------------------------------------------------------------------- Patient/Caregiver Education Details Patient Name: Date of Service: [name] 8/22/2024andnbsp2:45 PM Medical Record Number: [id number] Patient Account Number: [id number] Date of Birth/Gender: Treating RN: 1950/10/09 (72 y.o. [name] Primary Care Physician: Kerby Nora Other Clinician: Betha Loa Referring Physician: Treating Physician/Extender: Derrick Ravel Weeks in Treatment: 11 Education Assessment Education  Provided To: Patient Education Topics Provided Wound/Skin Impairment: Handouts: Other: continue wound care as directed Methods: Explain/Verbal Responses: State content correctly Electronic  Signature(s) Signed: 04/20/2023 5:01:40 PM By: Betha Loa Entered By: Betha Loa on 04/20/2023 12:31:25 -------------------------------------------------------------------------------- Wound Assessment Details Patient Name: Date of Service: William Albright HN H. 04/20/2023 2:45 PM Medical Record Number: [id number] Patient Account Number: [id number] Date of Birth/Sex: Treating RN: 1951/06/29 (72 y.o. [name] Primary Care Hazleigh Mccleave: Kerby Nora Other Clinician: Betha Loa Referring Aryani Daffern: Treating Aalani Aikens/Extender: [name], [name] in Treatment: 11 Wound Status Wound Number: 1 Primary Diabetic Wound/Ulcer of the Lower Extremity Etiology: Wound Location: Right Calcaneus Wound Open Wounding Event: Gradually Appeared Status: Date Acquired: 12/10/2022 Comorbid Congestive Heart Failure, Coronary Artery Disease, Weeks Of Treatment: 11 History: Hypertension, Type II Diabetes, Neuropathy Clustered Wound: [address] KIAN, BASSINGER ([id number]) [id number] Page 8 of 12 Wound Measurements Length: (cm) 0.8 Width: (cm) 0.4 Depth: (cm) 0.3 Area: (cm) 0.251 Volume: (cm) 0.075 % Reduction in Area: 94.1% % Reduction in Volume: 82.4% Epithelialization: None Wound Description Classification: Grade 1 Exudate Amount: Medium Exudate Type: Serosanguineous Exudate Color: red, brown Foul Odor After Cleansing: No Slough/Fibrino No Wound Bed Granulation Amount: Medium (34-66%) Exposed Structure Granulation Quality: Red Fascia Exposed: No Necrotic Amount: None Present (0%) Fat Layer (Subcutaneous Tissue) Exposed: Yes Tendon Exposed: No Muscle Exposed: No Joint Exposed: No Bone Exposed: No Treatment Notes Wound #1 (Calcaneus) Wound Laterality: Right Cleanser Soap and Water Discharge Instruction: Gently cleanse wound with antibacterial soap, rinse and pat dry prior to dressing wounds Peri-Wound Care Topical Primary  Dressing Prisma 4.34 (in) Discharge Instruction: Moisten w/normal saline or sterile water; Cover wound as directed. Do not remove from wound bed. Secondary Dressing (BORDER) Zetuvit Plus SILICONE BORDER Dressing 4x4 (in/in) Discharge Instruction: Please do not put silicone bordered dressings under wraps. Use non-bordered dressing only. Secured With Compression Wrap Compression Stockings Facilities manager) Signed: 04/20/2023 5:00:29 PM By: Angelina Pih Signed: 04/20/2023 5:01:40 PM By: Betha Loa Entered By: Betha Loa on 04/20/2023 12:16:31 [name], [name] ([id number]) [id number] Page 9 of 12 -------------------------------------------------------------------------------- Wound Assessment Details Patient Name: Date of Service: [name] 04/20/2023 2:45 PM Medical Record Number: [id number] Patient Account Number: [id number] Date of Birth/Sex: Treating RN: 03-Sep-1950 (72 y.o. [name] Primary Care Kweku Stankey: Kerby Nora Other Clinician: Betha Loa Referring Dianca Owensby: Treating Shreyas Piatkowski/Extender: [name], [name] in Treatment: 11 Wound Status Wound Number: 2 Primary Diabetic Wound/Ulcer of the Lower Extremity Etiology: Wound Location: Right, Distal, Dorsal Foot Wound Open Wounding Event: Gradually Appeared Status: Date Acquired: 12/10/2022 Comorbid Congestive Heart Failure, Coronary Artery Disease, Weeks Of Treatment: 11 History: Hypertension, Type II Diabetes, Neuropathy Clustered Wound: No Photos Wound Measurements Length: (cm) 0.9 Width: (cm) 1.2 Depth: (cm) 0.2 Area: (cm) 0.848 Volume: (cm) 0.17 % Reduction in Area: 1.9% % Reduction in Volume: -97.7% Epithelialization: Small (1-33%) Wound Description Classification: Grade 1 Exudate Amount: Medium Exudate Type: Serosanguineous Exudate Color: red, brown Foul Odor After Cleansing: No Slough/Fibrino No Wound Bed Granulation Amount:  None Present (0%) Exposed Structure Necrotic Amount: None Present (0%) Fascia Exposed: No Fat Layer (Subcutaneous Tissue) Exposed: Yes Tendon Exposed: No Muscle Exposed: No Joint Exposed: No Bone Exposed: No Treatment Notes Wound #2 (Foot) Wound Laterality: Dorsal, Right, Distal Cleanser Soap and Water Discharge Instruction: Gently cleanse wound with antibacterial soap, rinse and pat dry prior to dressing wounds Peri-Wound Care [name], [name] ([id number]) [id number] Page 10 of 12 Topical Primary Dressing Prisma 4.34 (  in) Discharge Instruction: Moisten w/normal saline or sterile water; Cover wound as directed. Do not remove from wound bed. Secondary Dressing (BORDER) Zetuvit Plus SILICONE BORDER Dressing 4x4 (in/in) Discharge Instruction: Please do not put silicone bordered dressings under wraps. Use non-bordered dressing only. Secured With Compression Wrap Compression Stockings Facilities manager) Signed: 04/20/2023 5:00:29 PM By: Angelina Pih Signed: 04/20/2023 5:01:40 PM By: Betha Loa Entered By: Betha Loa on 04/20/2023 12:17:18 -------------------------------------------------------------------------------- Wound Assessment Details Patient Name: Date of Service: William Albright HN H. 04/20/2023 2:45 PM Medical Record Number: [id number] Patient Account Number: [id number] Date of Birth/Sex: Treating RN: July 30, 1951 (72 y.o. [name] Primary Care Tiger Spieker: Kerby Nora Other Clinician: Betha Loa Referring Chaz Mcglasson: Treating Porsha Skilton/Extender: [name], [name] in Treatment: 11 Wound Status Wound Number: 3 Primary Diabetic Wound/Ulcer of the Lower Extremity Etiology: Wound Location: Right, Proximal, Dorsal Foot Wound Open Wounding Event: Gradually Appeared Status: Date Acquired: 12/10/2022 Comorbid Congestive Heart Failure, Coronary Artery Disease, Weeks Of Treatment: 11 History: Hypertension, Type  II Diabetes, Neuropathy Clustered Wound: No Photos Wound Measurements Length: (cm) 1 Width: (cm) 1 Depth: (cm) 0.1 Area: (cm) 0.785 Volume: (cm) 0.079 [name], [name] ([id number]) Wound Description Classification: Grade 1 Exudate Amount: Medium Exudate Type: Serosanguineous Exudate Color: red, brown Foul Odor After Cleansing: No Slough/Fibrino Yes % Reduction in Area: 68.8% % Reduction in Volume: 68.5% Epithelialization: Small (1-33%) [id number] Page 11 of 12 Wound Bed Granulation Amount: None Present (0%) Exposed Structure Necrotic Amount: Large (67-100%) Fascia Exposed: No Necrotic Quality: Adherent Slough Fat Layer (Subcutaneous Tissue) Exposed: Yes Tendon Exposed: No Muscle Exposed: No Joint Exposed: No Bone Exposed: No Treatment Notes Wound #3 (Foot) Wound Laterality: Dorsal, Right, Proximal Cleanser Soap and Water Discharge Instruction: Gently cleanse wound with antibacterial soap, rinse and pat dry prior to dressing wounds Peri-Wound Care Topical Primary Dressing Prisma 4.34 (in) Discharge Instruction: Moisten w/normal saline or sterile water; Cover wound as directed. Do not remove from wound bed. Secondary Dressing (BORDER) Zetuvit Plus SILICONE BORDER Dressing 4x4 (in/in) Discharge Instruction: Please do not put silicone bordered dressings under wraps. Use non-bordered dressing only. Secured With Compression Wrap Compression Stockings Facilities manager) Signed: 04/20/2023 5:00:29 PM By: Angelina Pih Signed: 04/20/2023 5:01:40 PM By: Betha Loa Entered By: Betha Loa on 04/20/2023 12:18:28 -------------------------------------------------------------------------------- Vitals Details Patient Name: Date of Service: William Albright HN H. 04/20/2023 2:45 PM Medical Record Number: [id number] Patient Account Number: [id number] Date of Birth/Sex: Treating RN: 1951-02-15 (72 y.o. [name] Primary Care  Cherisse Carrell: Kerby Nora Other Clinician: Betha Loa Referring TRUE Shackleford: Treating Derrall Hicks/Extender: [name], [name] in Treatment: 11 Vital Signs Time Taken: 15:01 Temperature (F): 97.6 Height (in): 66 Pulse (bpm): 76 Weight (lbs): 250 Respiratory Rate (breaths/min): [address] H ([id number]) [id number] Page 12 of 12 Body Mass Index (BMI): 40.3 Blood Pressure (mmHg): 105/80 Reference Range: 80 - 120 mg / dl Electronic Signature(s) Signed: 04/20/2023 5:01:40 PM By: Betha Loa Entered By: Betha Loa on 04/20/2023 12:05:19

## 2023-04-20 NOTE — Progress Notes (Addendum)
GALDINO, TWISDALE ([id number]) [id number] Page 1 of 8 Visit Report for 04/20/2023 Chief Complaint Document Details Patient Name: Date of Service: [name] 04/20/2023 2:45 PM Medical Record Number: [id number] Patient Account Number: [id number] Date of Birth/Sex: Treating RN: May 31, 1951 (72 y.o. [name] Primary Care Provider: Kerby Nora Other Clinician: Betha Loa Referring Provider: Treating Provider/Extender: [name], [name] in Treatment: 11 Information Obtained from: Patient Chief Complaint Right foot ulcers Electronic Signature(s) Signed: 04/20/2023 3:25:19 PM By: Allen Derry PA-C Entered By: Allen Derry on 04/20/2023 12:25:18 -------------------------------------------------------------------------------- Debridement Details Patient Name: Date of Service: [name]. 04/20/2023 2:45 PM Medical Record Number: [id number] Patient Account Number: [id number] Date of Birth/Sex: Treating RN: [birth year] (72 y.o. [name] Primary Care Provider: Kerby Nora Other Clinician: Betha Loa Referring Provider: Treating Provider/Extender: [name], [name] in Treatment: 11 Debridement Performed for Assessment: Wound #3 Right,Proximal,Dorsal Foot Performed By: Physician Allen Derry, PA-C Debridement Type: Debridement Severity of Tissue Pre Debridement: Fat layer exposed Level of Consciousness (Pre-procedure): Awake and Alert Pre-procedure Verification/Time Out Yes - 15:28 Taken: Start Time: 15:28 Percent of Wound Bed Debrided: 100% T Area Debrided (cm): otal 0.86 Tissue and other material debrided: Viable, Non-Viable, Slough, Subcutaneous, Slough Level: Skin/Subcutaneous Tissue Debridement Description: Excisional Instrument: Curette Bleeding: Minimum Hemostasis Achieved: Pressure Response to Treatment: Procedure was tolerated well Level of Consciousness (Post- Awake and  Alert procedure): NYSIR, KRISHNAMOORTHY ([id number]) [id number] Page 2 of 8 Post Debridement Measurements of Total Wound Length: (cm) 1 Width: (cm) 1.1 Depth: (cm) 0.1 Volume: (cm) 0.086 Character of Wound/Ulcer Post Debridement: Stable Severity of Tissue Post Debridement: Fat layer exposed Post Procedure Diagnosis Same as Pre-procedure Electronic Signature(s) Signed: 04/20/2023 5:00:29 PM By: Angelina Pih Signed: 04/20/2023 5:01:40 PM By: Betha Loa Signed: 04/21/2023 1:57:59 PM By: Allen Derry PA-C Entered By: Betha Loa on 04/20/2023 12:29:10 -------------------------------------------------------------------------------- HPI Details Patient Name: Date of Service: [name]. 04/20/2023 2:45 PM Medical Record Number: [id number] Patient Account Number: [id number] Date of Birth/Sex: Treating RN: January 29, 1951 (72 y.o. [name] Primary Care Provider: Kerby Nora Other Clinician: Betha Loa Referring Provider: Treating Provider/Extender: [name], [name] in Treatment: 11 History of Present Illness HPI Description: 01-31-2023 upon evaluation today patient appears to be doing poorly currently in regard to wounds on his right heel, right dorsal foot, and right lateral foot. Subsequently he does have known peripheral vascular disease and again this is something that he needs to I think Checked formally. This is what the majority of the conversation today hinged around and the patient voiced understanding as far as that is concerned. Fortunately I do not see any signs of active infection locally nor systemically which is great news. Patient does have a history of diabetes mellitus type 2, atrial fibrillation for which she is on long-term anticoagulant Therapy, hypertension, and coronary artery disease. Patient's hemoglobin A1c most recently was on 11-17-2022 and was 7.2 and currently he is on Eliquis and Plavix. 02-07-2023 upon  evaluation today patient appears to be doing well currently in regard to his wounds all things considered I feel like we are still maintaining that does not seem like it is any worse is also not significantly better. I discussed with the patient that I do believe he would benefit from the arterial evaluation we still need to get this done as quickly as possible and subsequently we did put in a follow-up call with the vascular office today with regard to  this. 02-14-2023 upon evaluation today patient appears to be doing a little better in regard to his wounds which are showing signs of loosening which is great news. With that being said he is experiencing an improvement overall in his symptoms and we are still waiting on the vascular evaluation want to get this result and consider whether or not his blood flow is good or not we will be able to make a better determination of next steps. For now we will try and avoid any aggressive sharp debridement to know that he has good arterial flow. 02-21-2023 upon evaluation today patient appears to be doing about the same in regard to his wound. Fortunately there does not appear to be any signs of active infection at this time which is great news. No fevers, chills, nausea, vomiting, or diarrhea. I did review patient's arterial study and it appears that he has pretty good flow in the left is not perfect but it is decent. On the right however he is definitely not doing nearly as good and I think that he is going to need to see one of the vascular doctors for further evaluation and treatment of this right leg in order to get these wounds to heal. 02-28-2023 upon evaluation today patient appears to be doing well currently in regard to his wound. He has been tolerating the dressing changes without complication. Fortunately I do not see any evidence of active infection locally nor systemically at this time. I do think that the eschar started to soften up and I would like  to try to get some of this off to see if we can get things moving in the right direction. He is in agreement with this plan. 03-09-2023 upon evaluation today patient appears to be doing well currently in regard to his wound. He has been tolerating the dressing changes without complication. Fortunately there does not appear to be any signs of active infection locally nor systemically at this time which is great news. I do believe clearing out some of the necrotic debris last week has helped to a degree. 03-16-2023 upon evaluation today patient appears to be doing well currently in regard to his wounds. He is going to be having a vascular angiogram in order to open up blood flow and I think this is going to be very beneficial for him. Fortunately I do not see any evidence of active infection locally nor systemically which is great news. 7/25; patient with predominantly ischemic wounds to on his dorsal foot and 1 on the right heel. He is going for his angiogram on Monday. He has been using the CAMRAN, TIETJEN ([id number]) [id number] Page 3 of 8 Iodoflex to the wound and changing this dressing himself. 04-11-2023 upon evaluation today patient appears to be doing well currently in regard to his wounds. In fact he had the arterial procedure with vascular and the great news is he actually has much improved blood flow he had a 95% blockage of the popliteal as well as the posterior tibial based on the record review. Post procedure he now has a residual of only about 30% in the popliteal and closer around 10% in the posterior tibial. The blood flow is greatly improved and it is obvious based on what we are seeing. 04-20-2023 upon evaluation today patient appears to be doing well currently in regard to his wounds. They are actually showing signs of excellent improvement and to be honest now that he is got good arterial flow going he is  really healing quite nicely. Fortunately I do not see  any signs of active infection locally or systemically which is great news. Electronic Signature(s) Signed: 04/20/2023 3:52:36 PM By: Allen Derry PA-C Entered By: Allen Derry on 04/20/2023 12:52:36 -------------------------------------------------------------------------------- Physical Exam Details Patient Name: Date of Service: [name] 04/20/2023 2:45 PM Medical Record Number: [id number] Patient Account Number: [id number] Date of Birth/Sex: Treating RN: [birth year] (72 y.o. [name] Primary Care Provider: Kerby Nora Other Clinician: Betha Loa Referring Provider: Treating Provider/Extender: [name], [name] in Treatment: 11 Constitutional Well-nourished and well-hydrated in no acute distress. Respiratory normal breathing without difficulty. Psychiatric this patient is able to make decisions and demonstrates good insight into disease process. Alert and Oriented x 3. pleasant and cooperative. Notes Upon inspection patient's wound bed actually showed signs of good granulation epithelization at this point. Fortunately I do not see any signs of worsening overall and I do believe that the patient is making excellent headway towards closure I did perform debridement only on the dorsal/proximal foot wound all the others were nice and clean. Electronic Signature(s) Signed: 04/20/2023 3:52:53 PM By: Allen Derry PA-C Entered By: Allen Derry on 04/20/2023 12:52:53 -------------------------------------------------------------------------------- Physician Orders Details Patient Name: Date of Service: [name]. 04/20/2023 2:45 PM Medical Record Number: [id number] Patient Account Number: [id number] Date of Birth/Sex: Treating RN: 27-Mar-1951 (72 y.o. [name] Primary Care Provider: Kerby Nora Other Clinician: Charlton, Herzfeld ([id number]) [id number] Page 4 of 8 Referring Provider: Treating  Provider/Extender: [name], [name] in Treatment: 11 Verbal / Phone Orders: Yes Clinician: Angelina Pih Read Back and Verified: Yes Diagnosis Coding ICD-10 Coding Code Description E11.621 Type 2 diabetes mellitus with foot ulcer L97.512 Non-pressure chronic ulcer of other part of right foot with fat layer exposed I48.0 Paroxysmal atrial fibrillation Z79.01 Long term (current) use of anticoagulants I10 Essential (primary) hypertension I25.10 Atherosclerotic heart disease of native coronary artery without angina pectoris Follow-up Appointments Return Appointment in 1 week. Bathing/ [employer] wounds with antibacterial soap and water. Anesthetic (Use 'Patient Medications' Section for Anesthetic Order Entry) Lidocaine applied to wound bed Wound Treatment Wound #1 - Calcaneus Wound Laterality: Right Cleanser: Soap and Water Every Other Day/30 Days Discharge Instructions: Gently cleanse wound with antibacterial soap, rinse and pat dry prior to dressing wounds Prim Dressing: Prisma 4.34 (in) Every Other Day/30 Days ary Discharge Instructions: Moisten w/normal saline or sterile water; Cover wound as directed. Do not remove from wound bed. Secondary Dressing: (BORDER) Zetuvit Plus SILICONE BORDER Dressing 4x4 (in/in) (Generic) Every Other Day/30 Days Discharge Instructions: Please do not put silicone bordered dressings under wraps. Use non-bordered dressing only. Wound #2 - Foot Wound Laterality: Dorsal, Right, Distal Cleanser: Soap and Water Every Other Day/30 Days Discharge Instructions: Gently cleanse wound with antibacterial soap, rinse and pat dry prior to dressing wounds Prim Dressing: Prisma 4.34 (in) Every Other Day/30 Days ary Discharge Instructions: Moisten w/normal saline or sterile water; Cover wound as directed. Do not remove from wound bed. Secondary Dressing: (BORDER) Zetuvit Plus SILICONE BORDER Dressing 4x4 (in/in) (Generic) Every Other Day/30  Days Discharge Instructions: Please do not put silicone bordered dressings under wraps. Use non-bordered dressing only. Wound #3 - Foot Wound Laterality: Dorsal, Right, Proximal Cleanser: Soap and Water Every Other Day/30 Days Discharge Instructions: Gently cleanse wound with antibacterial soap, rinse and pat dry prior to dressing wounds Prim Dressing: Prisma 4.34 (in) Every Other Day/30 Days ary Discharge Instructions: Moisten w/normal  saline or sterile water; Cover wound as directed. Do not remove from wound bed. Secondary Dressing: (BORDER) Zetuvit Plus SILICONE BORDER Dressing 4x4 (in/in) (Generic) Every Other Day/30 Days Discharge Instructions: Please do not put silicone bordered dressings under wraps. Use non-bordered dressing only. Electronic Signature(s) Signed: 04/20/2023 5:01:40 PM By: Betha Loa Signed: 04/21/2023 1:57:59 PM By: Allen Derry PA-C Entered By: Betha Loa on 04/20/2023 12:29:34 [name] ([id number]) [id number] Page 5 of 8 -------------------------------------------------------------------------------- Problem List Details Patient Name: Date of Service: [name] 04/20/2023 2:45 PM Medical Record Number: [id number] Patient Account Number: [id number] Date of Birth/Sex: Treating RN: 04-26-1951 (72 y.o. [name] Primary Care Provider: Kerby Nora Other Clinician: Betha Loa Referring Provider: Treating Provider/Extender: [name], [name] in Treatment: 11 Active Problems ICD-10 Encounter Code Description Active Date MDM Diagnosis E11.621 Type 2 diabetes mellitus with foot ulcer 01/31/2023 No Yes L97.512 Non-pressure chronic ulcer of other part of right foot with fat layer exposed 01/31/2023 No Yes I48.0 Paroxysmal atrial fibrillation 01/31/2023 No Yes Z79.01 Long term (current) use of anticoagulants 01/31/2023 No Yes I10 Essential (primary) hypertension 01/31/2023 No Yes I25.10 Atherosclerotic  heart disease of native coronary artery without angina pectoris 01/31/2023 No Yes Inactive Problems Resolved Problems Electronic Signature(s) Signed: 04/20/2023 3:25:13 PM By: Allen Derry PA-C Entered By: Allen Derry on 04/20/2023 12:25:13 Progress Note Details -------------------------------------------------------------------------------- [name] ([id number]) [id number] Page 6 of 8 Patient Name: Date of Service: [name] 04/20/2023 2:45 PM Medical Record Number: [id number] Patient Account Number: [id number] Date of Birth/Sex: Treating RN: 04/08/1951 (72 y.o. [name] Primary Care Provider: Kerby Nora Other Clinician: Betha Loa Referring Provider: Treating Provider/Extender: [name], [name] in Treatment: 11 Subjective Chief Complaint Information obtained from Patient Right foot ulcers History of Present Illness (HPI) 01-31-2023 upon evaluation today patient appears to be doing poorly currently in regard to wounds on his right heel, right dorsal foot, and right lateral foot. Subsequently he does have known peripheral vascular disease and again this is something that he needs to I think Checked formally. This is what the majority of the conversation today hinged around and the patient voiced understanding as far as that is concerned. Fortunately I do not see any signs of active infection locally nor systemically which is great news. Patient does have a history of diabetes mellitus type 2, atrial fibrillation for which she is on long-term anticoagulant Therapy, hypertension, and coronary artery disease. Patient's hemoglobin A1c most recently was on 11-17-2022 and was 7.2 and currently he is on Eliquis and Plavix. 02-07-2023 upon evaluation today patient appears to be doing well currently in regard to his wounds all things considered I feel like we are still maintaining that does not seem like it is any worse is also not  significantly better. I discussed with the patient that I do believe he would benefit from the arterial evaluation we still need to get this done as quickly as possible and subsequently we did put in a follow-up call with the vascular office today with regard to this. 02-14-2023 upon evaluation today patient appears to be doing a little better in regard to his wounds which are showing signs of loosening which is great news. With that being said he is experiencing an improvement overall in his symptoms and we are still waiting on the vascular evaluation want to get this result and consider whether or not his blood flow is good or not we will be able to  make a better determination of next steps. For now we will try and avoid any aggressive sharp debridement to know that he has good arterial flow. 02-21-2023 upon evaluation today patient appears to be doing about the same in regard to his wound. Fortunately there does not appear to be any signs of active infection at this time which is great news. No fevers, chills, nausea, vomiting, or diarrhea. I did review patient's arterial study and it appears that he has pretty good flow in the left is not perfect but it is decent. On the right however he is definitely not doing nearly as good and I think that he is going to need to see one of the vascular doctors for further evaluation and treatment of this right leg in order to get these wounds to heal. 02-28-2023 upon evaluation today patient appears to be doing well currently in regard to his wound. He has been tolerating the dressing changes without complication. Fortunately I do not see any evidence of active infection locally nor systemically at this time. I do think that the eschar started to soften up and I would like to try to get some of this off to see if we can get things moving in the right direction. He is in agreement with this plan. 03-09-2023 upon evaluation today patient appears to be doing well  currently in regard to his wound. He has been tolerating the dressing changes without complication. Fortunately there does not appear to be any signs of active infection locally nor systemically at this time which is great news. I do believe clearing out some of the necrotic debris last week has helped to a degree. 03-16-2023 upon evaluation today patient appears to be doing well currently in regard to his wounds. He is going to be having a vascular angiogram in order to open up blood flow and I think this is going to be very beneficial for him. Fortunately I do not see any evidence of active infection locally nor systemically which is great news. 7/25; patient with predominantly ischemic wounds to on his dorsal foot and 1 on the right heel. He is going for his angiogram on Monday. He has been using the Iodoflex to the wound and changing this dressing himself. 04-11-2023 upon evaluation today patient appears to be doing well currently in regard to his wounds. In fact he had the arterial procedure with vascular and the great news is he actually has much improved blood flow he had a 95% blockage of the popliteal as well as the posterior tibial based on the record review. Post procedure he now has a residual of only about 30% in the popliteal and closer around 10% in the posterior tibial. The blood flow is greatly improved and it is obvious based on what we are seeing. 04-20-2023 upon evaluation today patient appears to be doing well currently in regard to his wounds. They are actually showing signs of excellent improvement and to be honest now that he is got good arterial flow going he is really healing quite nicely. Fortunately I do not see any signs of active infection locally or systemically which is great news. Objective Constitutional Well-nourished and well-hydrated in no acute distress. Vitals Time Taken: 3:01 PM, Height: 66 in, Weight: 250 lbs, BMI: 40.3, Temperature: 97.6 F, Pulse: 76 bpm,  Respiratory Rate: 18 breaths/min, Blood Pressure: 105/80 mmHg. Respiratory normal breathing without difficulty. Psychiatric this patient is able to make decisions and demonstrates good insight into disease process. Alert and Oriented  x 3. pleasant and cooperative. General Notes: Upon inspection patient's wound bed actually showed signs of good granulation epithelization at this point. Fortunately I do not see any signs of ELEK, RINALDO ([id number]) [id number] Page 7 of 8 worsening overall and I do believe that the patient is making excellent headway towards closure I did perform debridement only on the dorsal/proximal foot wound all the others were nice and clean. Integumentary (Hair, Skin) Wound #1 status is Open. Original cause of wound was Gradually Appeared. The date acquired was: 12/10/2022. The wound has been in treatment 11 weeks. The wound is located on the Right Calcaneus. The wound measures 0.8cm length x 0.4cm width x 0.3cm depth; 0.251cm^2 area and 0.075cm^3 volume. There is Fat Layer (Subcutaneous Tissue) exposed. There is a medium amount of serosanguineous drainage noted. There is medium (34-66%) red granulation within the wound bed. There is no necrotic tissue within the wound bed. Wound #2 status is Open. Original cause of wound was Gradually Appeared. The date acquired was: 12/10/2022. The wound has been in treatment 11 weeks. The wound is located on the Right,Distal,Dorsal Foot. The wound measures 0.9cm length x 1.2cm width x 0.2cm depth; 0.848cm^2 area and 0.17cm^3 volume. There is Fat Layer (Subcutaneous Tissue) exposed. There is a medium amount of serosanguineous drainage noted. There is no granulation within the wound bed. There is no necrotic tissue within the wound bed. Wound #3 status is Open. Original cause of wound was Gradually Appeared. The date acquired was: 12/10/2022. The wound has been in treatment 11 weeks. The wound is located on the  Right,Proximal,Dorsal Foot. The wound measures 1cm length x 1cm width x 0.1cm depth; 0.785cm^2 area and 0.079cm^3 volume. There is Fat Layer (Subcutaneous Tissue) exposed. There is a medium amount of serosanguineous drainage noted. There is no granulation within the wound bed. There is a large (67-100%) amount of necrotic tissue within the wound bed including Adherent Slough. Assessment Active Problems ICD-10 Type 2 diabetes mellitus with foot ulcer Non-pressure chronic ulcer of other part of right foot with fat layer exposed Paroxysmal atrial fibrillation Long term (current) use of anticoagulants Essential (primary) hypertension Atherosclerotic heart disease of native coronary artery without angina pectoris Procedures Wound #3 Pre-procedure diagnosis of Wound #3 is a Diabetic Wound/Ulcer of the Lower Extremity located on the Right,Proximal,Dorsal Foot .Severity of Tissue Pre Debridement is: Fat layer exposed. There was a Excisional Skin/Subcutaneous Tissue Debridement with a total area of 0.86 sq cm performed by Allen Derry, PA-C. With the following instrument(s): Curette to remove Viable and Non-Viable tissue/material. Material removed includes Subcutaneous Tissue and Slough and. A time out was conducted at 15:28, prior to the start of the procedure. A Minimum amount of bleeding was controlled with Pressure. The procedure was tolerated well. Post Debridement Measurements: 1cm length x 1.1cm width x 0.1cm depth; 0.086cm^3 volume. Character of Wound/Ulcer Post Debridement is stable. Severity of Tissue Post Debridement is: Fat layer exposed. Post procedure Diagnosis Wound #3: Same as Pre-Procedure Plan Follow-up Appointments: Return Appointment in 1 week. Bathing/ Shower/ Hygiene: Wash wounds with antibacterial soap and water. Anesthetic (Use 'Patient Medications' Section for Anesthetic Order Entry): Lidocaine applied to wound bed WOUND #1: - Calcaneus Wound Laterality: Right Cleanser:  Soap and Water Every Other Day/30 Days Discharge Instructions: Gently cleanse wound with antibacterial soap, rinse and pat dry prior to dressing wounds Prim Dressing: Prisma 4.34 (in) Every Other Day/30 Days ary Discharge Instructions: Moisten w/normal saline or sterile water; Cover wound as directed. Do not remove from  wound bed. Secondary Dressing: (BORDER) Zetuvit Plus SILICONE BORDER Dressing 4x4 (in/in) (Generic) Every Other Day/30 Days Discharge Instructions: Please do not put silicone bordered dressings under wraps. Use non-bordered dressing only. WOUND #2: - Foot Wound Laterality: Dorsal, Right, Distal Cleanser: Soap and Water Every Other Day/30 Days Discharge Instructions: Gently cleanse wound with antibacterial soap, rinse and pat dry prior to dressing wounds Prim Dressing: Prisma 4.34 (in) Every Other Day/30 Days ary Discharge Instructions: Moisten w/normal saline or sterile water; Cover wound as directed. Do not remove from wound bed. Secondary Dressing: (BORDER) Zetuvit Plus SILICONE BORDER Dressing 4x4 (in/in) (Generic) Every Other Day/30 Days Discharge Instructions: Please do not put silicone bordered dressings under wraps. Use non-bordered dressing only. WOUND #3: - Foot Wound Laterality: Dorsal, Right, Proximal Cleanser: Soap and Water Every Other Day/30 Days Discharge Instructions: Gently cleanse wound with antibacterial soap, rinse and pat dry prior to dressing wounds Prim Dressing: Prisma 4.34 (in) Every Other Day/30 Days ary Discharge Instructions: Moisten w/normal saline or sterile water; Cover wound as directed. Do not remove from wound bed. Secondary Dressing: (BORDER) Zetuvit Plus SILICONE BORDER Dressing 4x4 (in/in) (Generic) Every Other Day/30 Days Discharge Instructions: Please do not put silicone bordered dressings under wraps. Use non-bordered dressing only. EUDELL, ANGEVINE ([id number]) [id number] Page 8 of 8 1. I would recommend that  we have the patient continue to monitor for any signs of infection or worsening. Based on what I am seeing I do believe that we are making excellent headway towards healing. 2. I am going to recommend that we continue with the collagen based dressing which seems to be doing quite well. 3. I am going to suggest that the patient should continue to elevate his legs much as possible keeping pressure off the back of the heel in general although I think he is really doing quite well. We will see patient back for reevaluation in 1 week here in the clinic. If anything worsens or changes patient will contact our office for additional recommendations. Electronic Signature(s) Signed: 04/20/2023 3:53:30 PM By: Allen Derry PA-C Entered By: Allen Derry on 04/20/2023 12:53:29 -------------------------------------------------------------------------------- SuperBill Details Patient Name: Date of Service: [name] 04/20/2023 Medical Record Number: [id number] Patient Account Number: [id number] Date of Birth/Sex: Treating RN: 1951/07/27 (72 y.o. [name] Primary Care Provider: Kerby Nora Other Clinician: Betha Loa Referring Provider: Treating Provider/Extender: [name], [name] in Treatment: 11 Diagnosis Coding ICD-10 Codes Code Description E11.621 Type 2 diabetes mellitus with foot ulcer L97.512 Non-pressure chronic ulcer of other part of right foot with fat layer exposed I48.0 Paroxysmal atrial fibrillation Z79.01 Long term (current) use of anticoagulants I10 Essential (primary) hypertension I25.10 Atherosclerotic heart disease of native coronary artery without angina pectoris Facility Procedures : CPT4 Code: [id number] Description: 11042 - DEB SUBQ TISSUE 20 SQ CM/< ICD-10 Diagnosis Description L97.512 Non-pressure chronic ulcer of other part of right foot with fat layer exposed Modifier: Quantity: 1 Physician Procedures : CPT4 Code Description Modifier  [id number] 11042 - WC PHYS SUBQ TISS 20 SQ CM ICD-10 Diagnosis Description L97.512 Non-pressure chronic ulcer of other part of right foot with fat layer exposed Quantity: 1 Electronic Signature(s) Signed: 04/20/2023 3:53:46 PM By: Allen Derry PA-C Entered By: Allen Derry on 04/20/2023 12:53:45

## 2023-04-26 ENCOUNTER — Ambulatory Visit (INDEPENDENT_AMBULATORY_CARE_PROVIDER_SITE_OTHER): Payer: PPO | Admitting: Nurse Practitioner

## 2023-04-26 ENCOUNTER — Encounter (INDEPENDENT_AMBULATORY_CARE_PROVIDER_SITE_OTHER): Payer: Self-pay | Admitting: Nurse Practitioner

## 2023-04-26 ENCOUNTER — Ambulatory Visit (INDEPENDENT_AMBULATORY_CARE_PROVIDER_SITE_OTHER): Payer: PPO

## 2023-04-26 ENCOUNTER — Other Ambulatory Visit (HOSPITAL_COMMUNITY): Payer: Self-pay

## 2023-04-26 VITALS — BP 101/63 | HR 83 | Resp 16 | Wt 254.4 lb

## 2023-04-26 DIAGNOSIS — I7025 Atherosclerosis of native arteries of other extremities with ulceration: Secondary | ICD-10-CM | POA: Diagnosis not present

## 2023-04-26 DIAGNOSIS — Z794 Long term (current) use of insulin: Secondary | ICD-10-CM | POA: Diagnosis not present

## 2023-04-26 DIAGNOSIS — I152 Hypertension secondary to endocrine disorders: Secondary | ICD-10-CM

## 2023-04-26 DIAGNOSIS — I739 Peripheral vascular disease, unspecified: Secondary | ICD-10-CM

## 2023-04-26 DIAGNOSIS — Z9889 Other specified postprocedural states: Secondary | ICD-10-CM | POA: Diagnosis not present

## 2023-04-26 DIAGNOSIS — E11319 Type 2 diabetes mellitus with unspecified diabetic retinopathy without macular edema: Secondary | ICD-10-CM

## 2023-04-26 DIAGNOSIS — E1159 Type 2 diabetes mellitus with other circulatory complications: Secondary | ICD-10-CM

## 2023-04-26 NOTE — Progress Notes (Signed)
Paramedicine Encounter    Patient ID: [name], male    DOB: 1951/01/27, 72 y.o.   MRN: [id number]   Complaints-denies  Edema-slight   Compliance with meds-yes   Pill box filled-n/a If so, by whom-n/a  Refills needed-pharmacy handles      Pt reports he is doing ok. He denies any sob, no dizziness, no c/p.  He goes to [facility] today and his legs are doing much better.  No recent med changes. He will need some eliquis samples for next week.   Not eaten yet this morning or taken insulin. He has taken his other meds.  B/p soft today-asymptomatic. Last week same b/p. No complaints.  Will send Inetta Fermo message again though to just keep her informed. He goes back to see her in October.    Cbg pta-159 BP 104/60   Pulse 74   Resp 18   SpO2 99%  Weight yesterday--249 Last visit weight-249  Patient Care Team: [name], MD as PCP - General Mariah Milling, Tollie Pizza, MD as PCP - Cardiology (Cardiology) Mariah Milling Tollie Pizza, MD as Consulting Physician (Cardiology) Kathyrn Sheriff, [facility] (Inactive) as Pharmacist (Pharmacist)  Patient Active Problem List   Diagnosis Date Noted   PAD (peripheral artery disease) (HCC) 04/11/2023   Leg weakness, bilateral 04/11/2023   SOB (shortness of breath) 03/03/2023   Atrial fibrillation (HCC) 02/22/2023   Alteration in mobility associated with pain 01/12/2023   Diabetic ulcer of ankle (HCC) 01/12/2023   Peripheral neuropathy 01/12/2023   NSTEMI (non-ST elevated myocardial infarction) (HCC) 11/05/2022   Penis disorder 05/16/2022   Other fatigue 05/16/2022   Rash 05/12/2022   GAD (generalized anxiety disorder) 09/21/2021   Encounter for power mobility device assessment 05/13/2021   Statin myopathy 03/30/2021   Chronic midline low back pain 11/12/2020   Tinea corporis 04/10/2018   ED (erectile dysfunction) 03/06/2018   Acute on chronic diastolic CHF (congestive heart failure) (HCC) 09/20/2016   Adjustment disorder with mixed anxiety and  depressed mood 09/09/2016   Gastroesophageal reflux disease 09/09/2016   Elevated left ventricular end-diastolic pressure (LVEDP) 03/11/2016   Anemia 03/11/2016   Hypertensive heart disease with heart failure (HCC)    Diabetes mellitus type 2 with retinopathy (HCC)    Coronary artery disease of native artery of native heart with stable angina pectoris (HCC)    Morbid obesity (HCC)    Chronic chest pain    Diabetic retinopathy (HCC) 09/23/2014   Snoring 12/21/2012   HYPOGONADISM 09/28/2010   B12 deficiency 09/28/2010   Vitamin D deficiency 09/28/2010   OTHER MALAISE AND FATIGUE 09/17/2010   TRIGGER FINGER, RIGHT MIDDLE 09/14/2010   BRANCH RETINAL VEIN OCCLUSION 11/26/2009   CONSTIPATION 08/10/2009   GASTRITIS 07/29/2009   Major depressive disorder, recurrent episode, moderate (HCC) 07/06/2007   RENAL CALCULUS, HX OF 03/26/2007   Hyperlipidemia associated with type 2 diabetes mellitus (HCC) 12/05/2006   Hypertension associated with diabetes (HCC) 12/05/2006   Osteoarthritis 12/05/2006    Current Outpatient Medications:    Acetaminophen (TYLENOL PO), Take 3-4 tablets by mouth as needed (pain)., Disp: , Rfl:    apixaban (ELIQUIS) 5 MG TABS tablet, Take 1 tablet (5 mg total) by mouth 2 (two) times daily., Disp: 56 tablet, Rfl: 0   carvedilol (COREG) 12.5 MG tablet, Take 1 tablet (12.5 mg total) by mouth 2 (two) times daily with a meal., Disp: 180 tablet, Rfl: 3   cholecalciferol (VITAMIN D3) 25 MCG (1000 UNIT) tablet, Take 2,000 Units by mouth daily., Disp: ,  Rfl:    clopidogrel (PLAVIX) 75 MG tablet, TAKE ONE TABLET BY MOUTH ONCE DAILY WITH BREAKFAST, Disp: 90 tablet, Rfl: 3   Continuous Blood Gluc Sensor (FREESTYLE LIBRE 2 SENSOR) MISC, APPLY SENSOR EVERY 14 DAYS TO MONITOR SUGAR CONTINOUSLY, Disp: 2 each, Rfl: 11   Continuous Glucose Receiver (FREESTYLE LIBRE 2 READER) DEVI, USE WITH SENSORS TO MONITOR SUGAR CONTINUOUSLY, Disp: 1 each, Rfl: 0   Dulaglutide (TRULICITY) 4.5 MG/0.5ML  SOPN, Inject 4.5 mg into the skin once a week. Via [name] PAP, Disp: , Rfl:    empagliflozin (JARDIANCE) 10 MG TABS tablet, Take 1 tablet (10 mg total) by mouth daily., Disp: 30 tablet, Rfl: 5   ENTRESTO 24-26 MG, TAKE ONE TABLET BY MOUTH TWO (TWO) TIMES DAILY., Disp: 60 tablet, Rfl: 3   ezetimibe (ZETIA) 10 MG tablet, TAKE ONE TABLET BY MOUTH ONCE DAILY, Disp: 30 tablet, Rfl: 2   furosemide (LASIX) 40 MG tablet, Take 40 mg by mouth daily., Disp: , Rfl:    HYDROcodone-acetaminophen (NORCO) 5-325 MG tablet, Take 1-2 tablets by mouth daily as needed for moderate pain., Disp: 60 tablet, Rfl: 0   HYDROcodone-acetaminophen (NORCO/VICODIN) 5-325 MG tablet, TAKE ONE (1) TO TWO (2) TABLETS BY MOUTH DAILY AS NEEDED FOR MODERATE PAIN. MAY, Disp: 60 tablet, Rfl: 0   HYDROcodone-acetaminophen (NORCO/VICODIN) 5-325 MG tablet, Take 1-2 tablets by mouth daily as needed for moderate pain., Disp: , Rfl:    hydrOXYzine (ATARAX) 10 MG tablet, TAKE 1 TABLET BY MOUTH 3 TIMES DAILY AS NEEDED FOR ANXIETY, Disp: 30 tablet, Rfl: 2   insulin aspart (NOVOLOG FLEXPEN) 100 UNIT/ML FlexPen, Inject 13 Units into the skin every morning., Disp: , Rfl:    isosorbide mononitrate (IMDUR) 30 MG 24 hr tablet, Take 1 tablet (30 mg total) by mouth 2 (two) times daily., Disp: 60 tablet, Rfl: 2   nitroGLYCERIN (NITROSTAT) 0.4 MG SL tablet, DISSOLVE 1 TABLET UNDER TONGUE AS NEEDEDFOR CHEST PAIN. MAY REPEAT 5 MINUTES APART 3 TIMES IF NEEDED, Disp: 25 tablet, Rfl: 3   spironolactone (ALDACTONE) 25 MG tablet, Take 1 tablet (25 mg total) by mouth daily., Disp: 30 tablet, Rfl: 5   TRESIBA FLEXTOUCH 100 UNIT/ML FlexTouch Pen, INJECT 50 UNITS INTO THE SKIN DAILY, Disp: 15 mL, Rfl: 2   TRUEPLUS 5-BEVEL PEN NEEDLES 31G X 6 MM MISC, USE TO INJECT INSULIN THREE TIMES A DAY, Disp: 300 each, Rfl: 3   venlafaxine XR (EFFEXOR-XR) 150 MG 24 hr capsule, TAKE ONE CAPSULE BY MOUTH DAILY WITH BREAKFAST. TAKE WITH EFFEXOR XR 75MG  FOR A TOTAL OF 225MG , Disp:  90 capsule, Rfl: 0   venlafaxine XR (EFFEXOR-XR) 75 MG 24 hr capsule, TAKE ONE CAPSULE BY MOUTH ONCE DAILY. TAKE IN ADDITION TO THE 150 MG CAPSULE FOR A TOTAL DOSE OF 225MG  DAILY, Disp: 90 capsule, Rfl: 0   vitamin B-12 (CYANOCOBALAMIN) 1000 MCG tablet, Take 1,000 mcg by mouth daily., Disp: , Rfl:  Allergies  Allergen Reactions   Bupropion Nausea Only   Ozempic (0.25 Or 0.5 Mg-Dose) [Semaglutide(0.25 Or 0.5mg -Dos)] Nausea And Vomiting   Atorvastatin Other (See Comments)    Body aches Similar effect with rosuvastatin 40 mg twice weekly      Social History   Socioeconomic History   Marital status: Widowed    Spouse name: Not on file   Number of children: 3   Years of education: Not on file   Highest education level: 7th grade  Occupational History   Occupation: disabled    Employer: UNEMPLOYED  Comment: back injury  Tobacco Use   Smoking status: Never   Smokeless tobacco: Never  Vaping Use   Vaping status: Never Used  Substance and Sexual Activity   Alcohol use: No    Alcohol/week: 0.0 standard drinks of alcohol   Drug use: No   Sexual activity: Not Currently  Other Topics Concern   Not on file  Social History Narrative   Has a roommate, Mr. [name]. No pets.   Social Determinants of Health   Financial Resource Strain: Low Risk  (11/23/2022)   Overall Financial Resource Strain (CARDIA)    Difficulty of Paying Living Expenses: Not hard at all  Food Insecurity: No Food Insecurity (12/29/2022)   Hunger Vital Sign    Worried About Running Out of Food in the Last Year: Never true    Ran Out of Food in the Last Year: Never true  Transportation Needs: No Transportation Needs (12/29/2022)   PRAPARE - Administrator, Civil Service (Medical): No    Lack of Transportation (Non-Medical): No  Physical Activity: Inactive (11/09/2022)   Exercise Vital Sign    Days of Exercise per Week: 0 days    Minutes of Exercise per Session: 0 min  Stress: No Stress Concern Present  (11/09/2022)   [employer] of Occupational Health - Occupational Stress Questionnaire    Feeling of Stress : Only a little  Social Connections: Moderately Isolated (11/09/2022)   Social Connection and Isolation Panel [NHANES]    Frequency of Communication with Friends and Family: More than three times a week    Frequency of Social Gatherings with Friends and Family: More than three times a week    Attends Religious Services: More than 4 times per year    Active Member of [employer] or Organizations: No    Attends Banker Meetings: Never    Marital Status: Widowed  Intimate Partner Violence: Not At Risk (11/09/2022)   Humiliation, Afraid, Rape, and Kick questionnaire    Fear of Current or Ex-Partner: No    Emotionally Abused: No    Physically Abused: No    Sexually Abused: No    Physical Exam      Future Appointments  Date Time Provider Department Center  04/27/2023  1:45 PM Maxwell Caul, MD [facility] None  05/09/2023  3:35 PM Charlsie Quest, NP [facility] None  05/18/2023  8:00 AM [facility]  05/25/2023  2:40 PM [name], MD [facility]  06/13/2023  3:00 PM Delma Freeze, FNP [facility] None  11/14/2023 10:45 AM [facility] ANNUAL WELLNESS VISIT 1 [facility]       Kerry Hough, Paramedic [phone] [facility] Paramedic  04/26/23

## 2023-04-27 ENCOUNTER — Encounter (INDEPENDENT_AMBULATORY_CARE_PROVIDER_SITE_OTHER): Payer: Self-pay | Admitting: Nurse Practitioner

## 2023-04-27 ENCOUNTER — Encounter: Payer: PPO | Admitting: Internal Medicine

## 2023-04-27 ENCOUNTER — Telehealth: Payer: Self-pay | Admitting: Cardiovascular Disease

## 2023-04-27 DIAGNOSIS — L97412 Non-pressure chronic ulcer of right heel and midfoot with fat layer exposed: Secondary | ICD-10-CM | POA: Diagnosis not present

## 2023-04-27 DIAGNOSIS — L97512 Non-pressure chronic ulcer of other part of right foot with fat layer exposed: Secondary | ICD-10-CM | POA: Diagnosis not present

## 2023-04-27 DIAGNOSIS — E11621 Type 2 diabetes mellitus with foot ulcer: Secondary | ICD-10-CM | POA: Diagnosis not present

## 2023-04-27 NOTE — Progress Notes (Signed)
Subjective:    Patient ID: [name], male    DOB: 1950-09-08, 72 y.o.   MRN: [id number] Chief Complaint  Patient presents with   Follow-up    [facility] 4 week with ABI    The patient returns to the office for followup and review status post angiogram with intervention on 03/27/2023.   Procedure: Procedure(s) Performed:             1.  Ultrasound guidance for vascular access left femoral artery             2.  Catheter placement into right SFA from left femoral approach             3.  Aortogram and selective right lower extremity angiogram             4.  Percutaneous transluminal angioplasty of right posterior tibial artery with 2.5 mm diameter by 30 cm length angioplasty balloon             5.  Percutaneous transluminal angioplasty of the right popliteal artery with 4 and 5 mm diameter Lutonix drug-coated angioplasty balloon             6.  StarClose closure device left femoral artery   The patient notes improvement in the lower extremity symptoms.  Since the patient's last visit he has had dramatic improvement in the lower extremity ulceration on his right foot.  He has been working with wound care receiving excellent care has made significant strides  There have been no significant changes to the patient's overall health care.  No documented history of amaurosis fugax or recent TIA symptoms. There are no recent neurological changes noted. No documented history of DVT, PE or superficial thrombophlebitis. The patient denies recent episodes of angina or shortness of breath.   ABI's Rt=[place] and Lt=0.80  (previous ABI's Rt=[place] and Lt=0.98) Duplex US of the right tibial vessels reveals biphasic/triphasic waveforms with good toe waveforms (previous studies reveal monophasic waveforms.  The left lower extremity has monophasic tibial vessel waveforms.    Review of Systems  Skin:  Positive for wound.  All other systems reviewed and are negative.      Objective:   Physical  Exam Vitals reviewed.  HENT:     Head: Normocephalic.  Cardiovascular:     Rate and Rhythm: Normal rate.     Pulses:          Dorsalis pedis pulses are detected w/ Doppler on the right side and detected w/ Doppler on the left side.       Posterior tibial pulses are detected w/ Doppler on the right side and detected w/ Doppler on the left side.  Skin:    General: Skin is warm and dry.  Neurological:     Mental Status: He is alert and oriented to person, place, and time.  Psychiatric:        Mood and Affect: Mood normal.        Behavior: Behavior normal.        Thought Content: Thought content normal.        Judgment: Judgment normal.     BP 101/63 (BP Location: Left Arm)   Pulse 83   Resp 16   Wt 254 lb 6.4 oz (115.4 kg)   BMI 41.06 kg/m   Past Medical History:  Diagnosis Date   Back injury 02/2002   worker's comp   CHF (congestive heart failure) (HCC)    Coronary artery disease, non-occlusive  a. cath 2002 with no sig CAD;  b. cath 2008 normal LM, LAD, LCx, p&dRCA 20-30%, PDA 30%; c.11/2015 NSTEMI/PCI: LM nl, LAD 95p (2.5x15 Xience DES), LCX nl, RCA 100p/m w/ L->R collats, EF 55-65% c. NSTEMI (02/2016) with no culprit leision, switched to Brilinta.  d. NSTEMI 03/2016: again, no culprit lesion and switched back to plavix 2/2 SOB with Brilnta.     Depression    Diabetes mellitus type 2, insulin dependent (HCC)    Hyperlipemia    Hypertension    Hypertensive heart disease    Kidney stones    Macular degeneration    Morbid obesity (HCC)    Osteoarthritis    Snoring     Social History   Socioeconomic History   Marital status: Widowed    Spouse name: Not on file   Number of children: 3   Years of education: Not on file   Highest education level: 7th grade  Occupational History   Occupation: disabled    Associate Professor: UNEMPLOYED    Comment: back injury  Tobacco Use   Smoking status: Never   Smokeless tobacco: Never  Vaping Use   Vaping status: Never Used  Substance  and Sexual Activity   Alcohol use: No    Alcohol/week: 0.0 standard drinks of alcohol   Drug use: No   Sexual activity: Not Currently  Other Topics Concern   Not on file  Social History Narrative   Has a roommate, Mr. [name]. No pets.   Social Determinants of Health   Financial Resource Strain: Low Risk  (11/23/2022)   Overall Financial Resource Strain (CARDIA)    Difficulty of Paying Living Expenses: Not hard at all  Food Insecurity: No Food Insecurity (12/29/2022)   Hunger Vital Sign    Worried About Running Out of Food in the Last Year: Never true    Ran Out of Food in the Last Year: Never true  Transportation Needs: No Transportation Needs (12/29/2022)   PRAPARE - Administrator, Civil Service (Medical): No    Lack of Transportation (Non-Medical): No  Physical Activity: Inactive (11/09/2022)   Exercise Vital Sign    Days of Exercise per Week: 0 days    Minutes of Exercise per Session: 0 min  Stress: No Stress Concern Present (11/09/2022)   [employer] of Occupational Health - Occupational Stress Questionnaire    Feeling of Stress : Only a little  Social Connections: Moderately Isolated (11/09/2022)   Social Connection and Isolation Panel [NHANES]    Frequency of Communication with Friends and Family: More than three times a week    Frequency of Social Gatherings with Friends and Family: More than three times a week    Attends Religious Services: More than 4 times per year    Active Member of [employer] or Organizations: No    Attends Banker Meetings: Never    Marital Status: Widowed  Intimate Partner Violence: Not At Risk (11/09/2022)   Humiliation, Afraid, Rape, and Kick questionnaire    Fear of Current or Ex-Partner: No    Emotionally Abused: No    Physically Abused: No    Sexually Abused: No    Past Surgical History:  Procedure Laterality Date   CARDIAC CATHETERIZATION  09/29/2000   diffuse LAD 30% LCA  EF 50-60%   CARDIAC CATHETERIZATION   06/30/2007   no significant CAD   CARDIAC CATHETERIZATION N/A 12/25/2015   Procedure: Left Heart Cath and Coronary Angiography;  Surgeon: Antonieta Iba, MD;  Location: [facility] INVASIVE CV LAB;  Service: Cardiovascular;  Laterality: N/A;   CARDIAC CATHETERIZATION N/A 12/25/2015   Procedure: Coronary Stent Intervention;  Surgeon: Alwyn Pea, MD;  Location: [facility] INVASIVE CV [facility];  Service: Cardiovascular;  Laterality: N/A;   CARDIAC CATHETERIZATION N/A 03/10/2016   Procedure: Left Heart Cath and Coronary Angiography;  Surgeon: Iran Ouch, MD;  Location: [facility] INVASIVE CV LAB;  Service: Cardiovascular;  Laterality: N/A;   CARDIAC CATHETERIZATION N/A 04/06/2016   Procedure: Left Heart Cath and Coronary Angiography;  Surgeon: Lyn Records, MD;  Location: [facility] INVASIVE CV LAB;  Service: Cardiovascular;  Laterality: N/A;   CARDIOVERSION N/A 03/03/2023   Procedure: CARDIOVERSION;  Surgeon: Antonieta Iba, MD;  Location: [facility] ORS;  Service: Cardiovascular;  Laterality: N/A;   CIRCUMCISION     CORONARY ANGIOPLASTY     LOWER EXTREMITY ANGIOGRAPHY Right 03/27/2023   Procedure: Lower Extremity Angiography;  Surgeon: Annice Needy, MD;  Location: [facility] INVASIVE CV LAB;  Service: Cardiovascular;  Laterality: Right;    Family History  Problem Relation Age of Onset   Alzheimer's disease Mother    Emphysema Mother    Diabetes Father    Heart disease Father        MI   Cancer Brother        ? Neck cancer    Allergies  Allergen Reactions   Bupropion Nausea Only   Ozempic (0.25 Or 0.5 Mg-Dose) [Semaglutide(0.25 Or 0.5mg -Dos)] Nausea And Vomiting   Atorvastatin Other (See Comments)    Body aches Similar effect with rosuvastatin 40 mg twice weekly       Latest Ref Rng & Units 02/28/2023   12:31 PM 01/30/2023    4:45 PM 11/08/2022    7:08 AM  CBC  WBC 4.0 - 10.5 K/uL 8.2  8.4  6.2   Hemoglobin 13.0 - 17.0 g/dL 40.9  81.1  91.4   Hematocrit 39.0 - 52.0 % 40.6  40.7  35.3   Platelets 150 -  400 K/uL 248  256  231       CMP     Component Value Date/Time   NA 138 02/28/2023 1231   K 4.2 02/28/2023 1231   CL 103 02/28/2023 1231   CO2 27 02/28/2023 1231   GLUCOSE 152 (H) 02/28/2023 1231   BUN 26 (H) 03/27/2023 1024   CREATININE 0.99 03/27/2023 1024   CALCIUM 8.6 (L) 02/28/2023 1231   PROT 7.2 11/05/2022 0824   ALBUMIN 3.5 11/05/2022 0824   AST 31 11/05/2022 0824   ALT 16 11/05/2022 0824   ALKPHOS 73 11/05/2022 0824   BILITOT 0.8 11/05/2022 0824   GFR 71.68 11/02/2020 0842   GFRNONAA >60 03/27/2023 1024     No results found.     Assessment & Plan:   1. Atherosclerosis of native arteries of the extremities with ulceration (HCC) Recommend:  The patient is status post successful angiogram with intervention.  The patient reports that the claudication symptoms and leg pain has improved.   The patient denies lifestyle limiting changes at this point in time.  No further invasive studies, angiography or surgery at this time The patient should continue walking and begin a more formal exercise program.  The patient should continue antiplatelet therapy and aggressive treatment of the lipid abnormalities  Continued surveillance is indicated as atherosclerosis is likely to progress with time.  Patient will follow-up in 3 months  Patient should undergo noninvasive studies as ordered. The patient will follow up with me to  review the studies.   2. Type 2 diabetes mellitus with retinopathy of both eyes, with long-term current use of insulin, macular edema presence unspecified, unspecified retinopathy severity (HCC) Continue hypoglycemic medications as already ordered, these medications have been reviewed and there are no changes at this time.  Hgb A1C to be monitored as already arranged by primary service  3. Hypertension associated with diabetes (HCC) Continue antihypertensive medications as already ordered, these medications have been reviewed and there are no changes at  this time.   Current Outpatient Medications on File Prior to Visit  Medication Sig Dispense Refill   Acetaminophen (TYLENOL PO) Take 3-4 tablets by mouth as needed (pain).     apixaban (ELIQUIS) 5 MG TABS tablet Take 1 tablet (5 mg total) by mouth 2 (two) times daily. 56 tablet 0   carvedilol (COREG) 12.5 MG tablet Take 1 tablet (12.5 mg total) by mouth 2 (two) times daily with a meal. 180 tablet 3   cholecalciferol (VITAMIN D3) 25 MCG (1000 UNIT) tablet Take 2,000 Units by mouth daily.     clopidogrel (PLAVIX) 75 MG tablet TAKE ONE TABLET BY MOUTH ONCE DAILY WITH BREAKFAST 90 tablet 3   Continuous Blood Gluc Sensor (FREESTYLE LIBRE 2 SENSOR) MISC APPLY SENSOR EVERY 14 DAYS TO MONITOR SUGAR CONTINOUSLY 2 each 11   Continuous Glucose Receiver (FREESTYLE LIBRE 2 READER) DEVI USE WITH SENSORS TO MONITOR SUGAR CONTINUOUSLY 1 each 0   Dulaglutide (TRULICITY) 4.5 MG/0.5ML SOPN Inject 4.5 mg into the skin once a week. Via [employer] PAP     empagliflozin (JARDIANCE) 10 MG TABS tablet Take 1 tablet (10 mg total) by mouth daily. 30 tablet 5   ENTRESTO 24-26 MG TAKE ONE TABLET BY MOUTH TWO (TWO) TIMES DAILY. 60 tablet 3   ezetimibe (ZETIA) 10 MG tablet TAKE ONE TABLET BY MOUTH ONCE DAILY 30 tablet 2   furosemide (LASIX) 40 MG tablet Take 40 mg by mouth daily.     HYDROcodone-acetaminophen (NORCO) 5-325 MG tablet Take 1-2 tablets by mouth daily as needed for moderate pain. 60 tablet 0   HYDROcodone-acetaminophen (NORCO/VICODIN) 5-325 MG tablet TAKE ONE (1) TO TWO (2) TABLETS BY MOUTH DAILY AS NEEDED FOR MODERATE PAIN. MAY 60 tablet 0   HYDROcodone-acetaminophen (NORCO/VICODIN) 5-325 MG tablet Take 1-2 tablets by mouth daily as needed for moderate pain.     hydrOXYzine (ATARAX) 10 MG tablet TAKE 1 TABLET BY MOUTH 3 TIMES DAILY AS NEEDED FOR ANXIETY 30 tablet 2   insulin aspart (NOVOLOG FLEXPEN) 100 UNIT/ML FlexPen Inject 13 Units into the skin every morning.     isosorbide mononitrate (IMDUR) 30 MG 24 hr  tablet Take 1 tablet (30 mg total) by mouth 2 (two) times daily. 60 tablet 2   nitroGLYCERIN (NITROSTAT) 0.4 MG SL tablet DISSOLVE 1 TABLET UNDER TONGUE AS NEEDEDFOR CHEST PAIN. MAY REPEAT 5 MINUTES APART 3 TIMES IF NEEDED 25 tablet 3   spironolactone (ALDACTONE) 25 MG tablet Take 1 tablet (25 mg total) by mouth daily. 30 tablet 5   TRESIBA FLEXTOUCH 100 UNIT/ML FlexTouch Pen INJECT 50 UNITS INTO THE SKIN DAILY 15 mL 2   TRUEPLUS 5-BEVEL PEN NEEDLES 31G X 6 MM MISC USE TO INJECT INSULIN THREE TIMES A DAY 300 each 3   venlafaxine XR (EFFEXOR-XR) 150 MG 24 hr capsule TAKE ONE CAPSULE BY MOUTH DAILY WITH BREAKFAST. TAKE WITH EFFEXOR XR 75MG  FOR A TOTAL OF 225MG  90 capsule 0   venlafaxine XR (EFFEXOR-XR) 75 MG 24 hr capsule TAKE  ONE CAPSULE BY MOUTH ONCE DAILY. TAKE IN ADDITION TO THE 150 MG CAPSULE FOR A TOTAL DOSE OF 225MG  DAILY 90 capsule 0   vitamin B-12 (CYANOCOBALAMIN) 1000 MCG tablet Take 1,000 mcg by mouth daily.     No current facility-administered medications on file prior to visit.    There are no Patient Instructions on file for this visit. No follow-ups on file.   Georgiana Spinner, NP

## 2023-04-27 NOTE — Telephone Encounter (Signed)
Spoke with patients daughter and she states that for now he is getting samples from paramedic who comes out to check on him weekly. She states they may be able to cover him until December. Advised that if they should need any further assistance to call back and ask to speak with Rinaldo Cloud. She verbalized understanding with no further questions at this time.

## 2023-04-27 NOTE — Telephone Encounter (Signed)
See other telephone encounter.

## 2023-04-27 NOTE — Telephone Encounter (Signed)
Left voicemail message to call back for review of assistance application.

## 2023-04-27 NOTE — Telephone Encounter (Signed)
Patient's daughter returning Pamela's phone call.

## 2023-04-28 NOTE — Progress Notes (Signed)
DAJHON, GRIFFIS ([id number]) [id number] Page 1 of 12 Visit Report for 04/27/2023 Arrival Information Details Patient Name: Date of Service: [name] 04/27/2023 1:45 PM Medical Record Number: [id number] Patient Account Number: [id number] Date of Birth/Sex: Treating RN: [birth year] (72 y.o. [name] Primary Care Denyse Fillion: Kerby Nora Other Clinician: Betha Loa Referring Blane Worthington: Treating Miracle Criado/Extender: RO BSO N, MICHA EL Vernie Ammons, [name] in Treatment: 12 Visit Information History Since Last Visit All ordered tests and consults were completed: No Patient Arrived: Ambulatory Added or deleted any medications: No Arrival Time: 14:09 Any new allergies or adverse reactions: No Transfer Assistance: None Had a fall or experienced change in No Patient Identification Verified: Yes activities of daily living that may affect Secondary Verification Process Completed: Yes risk of falls: Patient Requires Transmission-Based Precautions: No Signs or symptoms of abuse/neglect since last visito No Patient Has Alerts: Yes Hospitalized since last visit: No Patient Alerts: Patient on Blood Thinner Implantable device outside of the clinic excluding No Diabetes type 2 cellular tissue based products placed in the center Eliquis/Plavix since last visit: ABI R 1.23 TBI 0.57 Has Dressing in Place as Prescribed: Yes ABI L 0.98 TBI 0.80 Pain Present Now: No Electronic Signature(s) Signed: 04/28/2023 9:13:48 AM By: Betha Loa Entered By: Betha Loa on 04/27/2023 11:09:35 -------------------------------------------------------------------------------- Clinic Level of Care Assessment Details Patient Name: Date of Service: [name] 04/27/2023 1:45 PM Medical Record Number: [id number] Patient Account Number: [id number] Date of Birth/Sex: Treating RN: 1951/04/18 (72 y.o. [name] Primary Care Kord Monette: Kerby Nora Other  Clinician: Betha Loa Referring Antuane Eastridge: Treating Jeray Shugart/Extender: RO BSO N, MICHA EL Vernie Ammons, [name] in Treatment: 12 Clinic Level of Care Assessment Items TOOL 4 Quantity Score []  - 0 Use when only an EandM is performed on FOLLOW-UP visit ASSESSMENTS - Nursing Assessment / Reassessment X- 1 10 Reassessment of Co-morbidities (includes updates in patient status) X- 1 5 Reassessment of Adherence to Treatment Plan KEONTAY, BRECHBIEL ([id number]) [id number] Page 2 of 12 ASSESSMENTS - Wound and Skin A ssessment / Reassessment []  - 0 Simple Wound Assessment / Reassessment - one wound X- 3 5 Complex Wound Assessment / Reassessment - multiple wounds []  - 0 Dermatologic / Skin Assessment (not related to wound area) ASSESSMENTS - Focused Assessment []  - 0 Circumferential Edema Measurements - multi extremities []  - 0 Nutritional Assessment / Counseling / Intervention []  - 0 Lower Extremity Assessment (monofilament, tuning fork, pulses) []  - 0 Peripheral Arterial Disease Assessment (using hand held doppler) ASSESSMENTS - Ostomy and/or Continence Assessment and Care []  - 0 Incontinence Assessment and Management []  - 0 Ostomy Care Assessment and Management (repouching, etc.) PROCESS - Coordination of Care X - Simple Patient / Family Education for ongoing care 1 15 []  - 0 Complex (extensive) Patient / Family Education for ongoing care []  - 0 Staff obtains Chiropractor, Records, T Results / Process Orders est []  - 0 Staff telephones HHA, Nursing Homes / Clarify orders / etc []  - 0 Routine Transfer to another Facility (non-emergent condition) []  - 0 Routine Hospital Admission (non-emergent condition) []  - 0 New Admissions / Manufacturing engineer / Ordering NPWT Apligraf, etc. , []  - 0 Emergency Hospital Admission (emergent condition) X- 1 10 Simple Discharge Coordination []  - 0 Complex (extensive) Discharge Coordination PROCESS - Special  Needs []  - 0 Pediatric / Minor Patient Management []  - 0 Isolation Patient Management []  - 0 Hearing / Language / Visual special needs []  - 0 Assessment  of Community assistance (transportation, D/C planning, etc.) []  - 0 Additional assistance / Altered mentation []  - 0 Support Surface(s) Assessment (bed, cushion, seat, etc.) INTERVENTIONS - Wound Cleansing / Measurement []  - 0 Simple Wound Cleansing - one wound X- 3 5 Complex Wound Cleansing - multiple wounds X- 1 5 Wound Imaging (photographs - any number of wounds) []  - 0 Wound Tracing (instead of photographs) []  - 0 Simple Wound Measurement - one wound X- 3 5 Complex Wound Measurement - multiple wounds INTERVENTIONS - Wound Dressings X - Small Wound Dressing one or multiple wounds 3 10 []  - 0 Medium Wound Dressing one or multiple wounds []  - 0 Large Wound Dressing one or multiple wounds []  - 0 Application of Medications - topical []  - 0 Application of Medications - injection INTERVENTIONS - Miscellaneous []  - 0 External ear exam MANFORD, ECKHARDT ([id number]) [id number] Page 3 of 12 []  - 0 Specimen Collection (cultures, biopsies, blood, body fluids, etc.) []  - 0 Specimen(s) / Culture(s) sent or taken to Lab for analysis []  - 0 Patient Transfer (multiple staff / Michiel Sites Lift / Similar devices) []  - 0 Simple Staple / Suture removal (25 or less) []  - 0 Complex Staple / Suture removal (26 or more) []  - 0 Hypo / Hyperglycemic Management (close monitor of Blood Glucose) []  - 0 Ankle / Brachial Index (ABI) - do not check if billed separately X- 1 5 Vital Signs Has the patient been seen at the hospital within the last three years: Yes Total Score: 125 Level Of Care: New/Established - Level 4 Electronic Signature(s) Signed: 04/28/2023 9:13:48 AM By: Betha Loa Entered By: Betha Loa on 04/27/2023  11:37:45 -------------------------------------------------------------------------------- Encounter Discharge Information Details Patient Name: Date of Service: [name] HN H. 04/27/2023 1:45 PM Medical Record Number: [id number] Patient Account Number: [id number] Date of Birth/Sex: Treating RN: [birth year] (72 y.o. [name] Primary Care Jaana Brodt: Kerby Nora Other Clinician: Betha Loa Referring Harry Bark: Treating Monico Sudduth/Extender: RO BSO N, MICHA EL Vernie Ammons, [name] in Treatment: 12 Encounter Discharge Information Items Discharge Condition: Stable Ambulatory Status: Ambulatory Discharge Destination: Home Transportation: Private Auto Accompanied By: self Schedule Follow-up Appointment: Yes Clinical Summary of Care: Electronic Signature(s) Signed: 04/28/2023 9:13:48 AM By: Betha Loa Entered By: Betha Loa on 04/27/2023 12:19:20 Lower Extremity Assessment Details -------------------------------------------------------------------------------- [name] ([id number]) [id number] Page 4 of 12 Patient Name: Date of Service: [name] 04/27/2023 1:45 PM Medical Record Number: [id number] Patient Account Number: [id number] Date of Birth/Sex: Treating RN: 12/22/1950 (72 y.o. [name] Primary Care Laykin Rainone: Kerby Nora Other Clinician: Betha Loa Referring Broden Holt: Treating Hung Rhinesmith/Extender: RO BSO N, MICHA EL Vernie Ammons, [name] in Treatment: 12 Edema Assessment Left: Right: Assessed: No Yes Edema: No Calf Left: Right: Point of Measurement: 30 cm From Medial Instep 39.5 cm Ankle Left: Right: Point of Measurement: 18 cm From Medial Instep 24 cm Knee To Floor Left: Right: From Medial Instep 40 cm Vascular Assessment Left: Right: Pulses: Dorsalis Pedis Palpable: Yes Toe Nail Assessment Left: Right: Thick: Yes Discolored: No Deformed: No Improper Length and Hygiene: No Electronic  Signature(s) Signed: 04/27/2023 6:06:43 PM By: Angelina Pih Signed: 04/28/2023 9:13:48 AM By: Betha Loa Entered By: Betha Loa on 04/27/2023 11:23:53 -------------------------------------------------------------------------------- Multi Wound Chart Details Patient Name: Date of Service: [name] HN H. 04/27/2023 1:45 PM Medical Record Number: [id number] Patient Account Number: [id number] Date of Birth/Sex: Treating RN: 03/26/1951 (72 y.o. [name] Primary Care Lexington Devine: Kerby Nora Other  Clinician: Betha Loa Referring Makenzye Troutman: Treating Symeon Puleo/Extender: RO BSO N, MICHA EL G Bedsole, [name] in Treatment: 12 Vital Signs Height(in): 66 Pulse(bpm): 79 Weight(lbs): 250 Blood Pressure(mmHg): 102/88 Body Mass Index(BMI): 40.3 Temperature(F): 97.9 Respiratory Rate(breaths/min): [address] H ([id number]):Wound Assessments] [[id number] Page 5 of [id number] Page 5 of 12] Wound Number: 1 2 3  Photos: Right Calcaneus Right, Distal, Dorsal Foot Right, Proximal, Dorsal Foot Wound Location: Gradually Appeared Gradually Appeared Gradually Appeared Wounding Event: Diabetic Wound/Ulcer of the Lower Diabetic Wound/Ulcer of the Lower Diabetic Wound/Ulcer of the Lower Primary Etiology: Extremity Extremity Extremity Congestive Heart Failure, Coronary Congestive Heart Failure, Coronary Congestive Heart Failure, Coronary Comorbid History: Artery Disease, Hypertension, Type II Artery Disease, Hypertension, Type II Artery Disease, Hypertension, Type II Diabetes, Neuropathy Diabetes, Neuropathy Diabetes, Neuropathy 12/10/2022 12/10/2022 12/10/2022 Date Acquired: 12 12 12  Weeks of Treatment: Open Open Open Wound Status: No No No Wound Recurrence: Yes No No Clustered Wound: 0.5x0.3x0.2 0.4x0.5x0.2 0.7x0.8x0.1 Measurements L x W x D (cm) 0.118 0.157 0.44 A (cm) : rea 0.024 0.031 0.044 Volume (cm)  : 97.20% 81.80% 82.50% % Reduction in A rea: 94.40% 64.00% 82.50% % Reduction in Volume: Grade 1 Grade 1 Grade 1 Classification: Medium Medium Medium Exudate A mount: Serosanguineous Serosanguineous Serosanguineous Exudate Type: red, brown red, brown red, brown Exudate Color: Medium (34-66%) None Present (0%) None Present (0%) Granulation A mount: Red N/A N/A Granulation Quality: None Present (0%) None Present (0%) Large (67-100%) Necrotic A mount: Fat Layer (Subcutaneous Tissue): Yes Fat Layer (Subcutaneous Tissue): Yes Fat Layer (Subcutaneous Tissue): Yes Exposed Structures: Fascia: No Fascia: No Fascia: No Tendon: No Tendon: No Tendon: No Muscle: No Muscle: No Muscle: No Joint: No Joint: No Joint: No Bone: No Bone: No Bone: No None Small (1-33%) Small (1-33%) Epithelialization: Treatment Notes Electronic Signature(s) Signed: 04/28/2023 9:13:48 AM By: Betha Loa Entered By: Betha Loa on 04/27/2023 11:24:02 -------------------------------------------------------------------------------- Multi-Disciplinary Care Plan Details Patient Name: Date of Service: [name] HN H. 04/27/2023 1:45 PM Medical Record Number: [id number] Patient Account Number: [id number] Date of Birth/Sex: Treating RN: [birth year] (72 y.o. [name] Primary Care Kavonte Bearse: Kerby Nora Other Clinician: Betha Loa Referring Earl Losee: Treating Blakely Gluth/Extender: RO BSO N, MICHA EL Vernie Ammons, [name] in Treatment: [address] Inactive Necrotic Tissue JAYLEN, ORNE ([id number]) [id number] Page 6 of 12 Nursing Diagnoses: Impaired tissue integrity related to necrotic/devitalized tissue Knowledge deficit related to management of necrotic/devitalized tissue Goals: Necrotic/devitalized tissue will be minimized in the wound bed Date Initiated: 02/01/2023 Date Inactivated: 02/28/2023 Target Resolution Date: 03/04/2023 Goal Status: Met Patient/caregiver  will verbalize understanding of reason and process for debridement of necrotic tissue Date Initiated: 02/01/2023 Target Resolution Date: 04/25/2023 Goal Status: Active Interventions: Assess patient pain level pre-, during and post procedure and prior to discharge Provide education on necrotic tissue and debridement process Treatment Activities: Apply topical anesthetic as ordered : 01/31/2023 Notes: Wound/Skin Impairment Nursing Diagnoses: Impaired tissue integrity Knowledge deficit related to ulceration/compromised skin integrity Goals: Patient/caregiver will verbalize understanding of skin care regimen Date Initiated: 02/01/2023 Date Inactivated: 02/28/2023 Target Resolution Date: 03/04/2023 Goal Status: Met Ulcer/skin breakdown will have a volume reduction of 30% by week 4 Date Initiated: 02/01/2023 Date Inactivated: 02/28/2023 Target Resolution Date: 03/04/2023 Goal Status: Met Ulcer/skin breakdown will have a volume reduction of 50% by week 8 Date Initiated: 02/01/2023 Date Inactivated: 04/11/2023 Target Resolution Date: 04/04/2023 Goal Status: Met Ulcer/skin breakdown will have a volume reduction of 80% by week 12 Date Initiated: 02/01/2023 Target Resolution Date: 05/05/2023 Goal  Status: Active Interventions: Assess patient/caregiver ability to obtain necessary supplies Assess patient/caregiver ability to perform ulcer/skin care regimen upon admission and as needed Assess ulceration(s) every visit Provide education on ulcer and skin care Treatment Activities: Referred to DME Jayesh Marbach for dressing supplies : 01/31/2023 Skin care regimen initiated : 01/31/2023 Notes: Electronic Signature(s) Signed: 04/27/2023 6:06:43 PM By: Angelina Pih Signed: 04/28/2023 9:13:48 AM By: Betha Loa Entered By: Betha Loa on 04/27/2023 11:46:47 -------------------------------------------------------------------------------- Pain Assessment Details Patient Name: Date of Service: [name] HN H.  04/27/2023 1:45 PM Medical Record Number: [id number] Patient Account Number: [id number] Date of Birth/Sex: Treating RN: [birth year] (72 y.o. Demone, Gavidia, [place] H ([id number]) [id number] Page 7 of 12 Primary Care Maili Shutters: Kerby Nora Other Clinician: Betha Loa Referring Landry Lookingbill: Treating Shivali Quackenbush/Extender: RO BSO N, MICHA EL Vernie Ammons, [name] in Treatment: 12 Active Problems Location of Pain Severity and Description of Pain Patient Has Paino No Site Locations Pain Management and Medication Current Pain Management: Electronic Signature(s) Signed: 04/27/2023 6:06:43 PM By: Angelina Pih Signed: 04/28/2023 9:13:48 AM By: Betha Loa Entered By: Betha Loa on 04/27/2023 11:12:39 -------------------------------------------------------------------------------- Patient/Caregiver Education Details Patient Name: Date of Service: [name] 8/29/2024andnbsp1:45 PM Medical Record Number: [id number] Patient Account Number: [id number] Date of Birth/Gender: Treating RN: 1951-07-07 (72 y.o. [name] Primary Care Physician: Kerby Nora Other Clinician: Betha Loa Referring Physician: Treating Physician/Extender: RO BSO N, MICHA EL Vernie Ammons, [name] in Treatment: 12 Education Assessment Education Provided To: Patient Education Topics Provided Wound/Skin Impairment: Handouts: Other: continue wound care as directed Methods: Explain/Verbal Responses: State content correctly Electronic Signature(s) Signed: 04/28/2023 9:13:48 AM By: Jabier Gauss H 04/28/2023 9:13:48 AM By: Betha Loa Signed: ([id number]) [id number] Page 8 of 12 Entered By: Betha Loa on 04/27/2023 12:18:32 -------------------------------------------------------------------------------- Wound Assessment Details Patient Name: Date of Service: [name] 04/27/2023 1:45 PM Medical Record Number:  [id number] Patient Account Number: [id number] Date of Birth/Sex: Treating RN: 05/06/1951 (72 y.o. [name] Primary Care Milla Wahlberg: Kerby Nora Other Clinician: Betha Loa Referring Haleem Hanner: Treating Staceyann Knouff/Extender: RO BSO N, MICHA EL Vernie Ammons, [name] in Treatment: 12 Wound Status Wound Number: 1 Primary Diabetic Wound/Ulcer of the Lower Extremity Etiology: Wound Location: Right Calcaneus Wound Open Wounding Event: Gradually Appeared Status: Date Acquired: 12/10/2022 Comorbid Congestive Heart Failure, Coronary Artery Disease, Weeks Of Treatment: 12 History: Hypertension, Type II Diabetes, Neuropathy Clustered Wound: Yes Photos Wound Measurements Length: (cm) 0.5 Width: (cm) 0.3 Depth: (cm) 0.2 Area: (cm) 0.118 Volume: (cm) 0.024 % Reduction in Area: 97.2% % Reduction in Volume: 94.4% Epithelialization: None Wound Description Classification: Grade 1 Exudate Amount: Medium Exudate Type: Serosanguineous Exudate Color: red, brown Foul Odor After Cleansing: No Slough/Fibrino No Wound Bed Granulation Amount: Medium (34-66%) Exposed Structure Granulation Quality: Red Fascia Exposed: No Necrotic Amount: None Present (0%) Fat Layer (Subcutaneous Tissue) Exposed: Yes Tendon Exposed: No Muscle Exposed: No Joint Exposed: No Bone Exposed: No Treatment Notes Wound #1 (Calcaneus) Wound Laterality: Right DAVIONTAE, WICKENHAUSER ([id number]) [id number] Page 9 of 12 Soap and Water Discharge Instruction: Gently cleanse wound with antibacterial soap, rinse and pat dry prior to dressing wounds Peri-Wound Care Topical Primary Dressing Prisma 4.34 (in) Discharge Instruction: Moisten w/normal saline or sterile water; Cover wound as directed. Do not remove from wound bed. Secondary Dressing (BORDER) Zetuvit Plus SILICONE BORDER Dressing 4x4 (in/in) Discharge Instruction: Please do not put silicone bordered dressings under wraps. Use  non-bordered dressing only. Secured With Compression Wrap Compression  Stockings Facilities manager) Signed: 04/27/2023 6:06:43 PM By: Angelina Pih Signed: 04/28/2023 9:13:48 AM By: Betha Loa Entered By: Betha Loa on 04/27/2023 11:20:19 -------------------------------------------------------------------------------- Wound Assessment Details Patient Name: Date of Service: [name] HN H. 04/27/2023 1:45 PM Medical Record Number: [id number] Patient Account Number: [id number] Date of Birth/Sex: Treating RN: 03-Apr-1951 (72 y.o. [name] Primary Care Tylicia Sherman: Kerby Nora Other Clinician: Betha Loa Referring Marcella Dunnaway: Treating Kohler Pellerito/Extender: RO BSO N, MICHA EL Vernie Ammons, [name] in Treatment: 12 Wound Status Wound Number: 2 Primary Diabetic Wound/Ulcer of the Lower Extremity Etiology: Wound Location: Right, Distal, Dorsal Foot Wound Open Wounding Event: Gradually Appeared Status: Date Acquired: 12/10/2022 Comorbid Congestive Heart Failure, Coronary Artery Disease, Weeks Of Treatment: 12 History: Hypertension, Type II Diabetes, Neuropathy Clustered Wound: No Photos Wound Measurements Length: (cm) 0.4 Width: (cm) 0.5 RINALDO, SCHARPF ([id number]) Depth: (cm) 0.2 Area: (cm) 0.157 Volume: (cm) 0.031 % Reduction in Area: 81.8% % Reduction in Volume: 64% [id number] Page 10 of 12 Epithelialization: Small (1-33%) Wound Description Classification: Grade 1 Exudate Amount: Medium Exudate Type: Serosanguineous Exudate Color: red, brown Foul Odor After Cleansing: No Slough/Fibrino No Wound Bed Granulation Amount: None Present (0%) Exposed Structure Necrotic Amount: None Present (0%) Fascia Exposed: No Fat Layer (Subcutaneous Tissue) Exposed: Yes Tendon Exposed: No Muscle Exposed: No Joint Exposed: No Bone Exposed: No Treatment Notes Wound #2 (Foot) Wound Laterality: Dorsal, Right, Distal Cleanser Soap and  Water Discharge Instruction: Gently cleanse wound with antibacterial soap, rinse and pat dry prior to dressing wounds Peri-Wound Care Topical Primary Dressing Prisma 4.34 (in) Discharge Instruction: Moisten w/normal saline or sterile water; Cover wound as directed. Do not remove from wound bed. Secondary Dressing (BORDER) Zetuvit Plus SILICONE BORDER Dressing 4x4 (in/in) Discharge Instruction: Please do not put silicone bordered dressings under wraps. Use non-bordered dressing only. Secured With Compression Wrap Compression Stockings Facilities manager) Signed: 04/27/2023 6:06:43 PM By: Angelina Pih Signed: 04/28/2023 9:13:48 AM By: Betha Loa Entered By: Betha Loa on 04/27/2023 11:21:11 -------------------------------------------------------------------------------- Wound Assessment Details Patient Name: Date of Service: [name] HN H. 04/27/2023 1:45 PM Medical Record Number: [id number] Patient Account Number: [id number] Date of Birth/Sex: Treating RN: [birth year] (72 y.o. [name] Primary Care Damario Gillie: Kerby Nora Other Clinician: Betha Loa Referring Mao Lockner: Treating Landis Dowdy/Extender: RO BSO Dorris Carnes, MICHA EL Vernie Ammons, [name] in Treatment: [address] PIERSEN, HOGENSON ([id number]) [id number] Page 11 of 12 Wound Number: 3 Primary Diabetic Wound/Ulcer of the Lower Extremity Etiology: Wound Location: Right, Proximal, Dorsal Foot Wound Open Wounding Event: Gradually Appeared Status: Date Acquired: 12/10/2022 Comorbid Congestive Heart Failure, Coronary Artery Disease, Weeks Of Treatment: 12 History: Hypertension, Type II Diabetes, Neuropathy Clustered Wound: No Photos Wound Measurements Length: (cm) 0.7 Width: (cm) 0.8 Depth: (cm) 0.1 Area: (cm) 0.44 Volume: (cm) 0.044 % Reduction in Area: 82.5% % Reduction in Volume: 82.5% Epithelialization: Small (1-33%) Wound Description Classification: Grade  1 Exudate Amount: Medium Exudate Type: Serosanguineous Exudate Color: red, brown Foul Odor After Cleansing: No Slough/Fibrino Yes Wound Bed Granulation Amount: None Present (0%) Exposed Structure Necrotic Amount: Large (67-100%) Fascia Exposed: No Necrotic Quality: Adherent Slough Fat Layer (Subcutaneous Tissue) Exposed: Yes Tendon Exposed: No Muscle Exposed: No Joint Exposed: No Bone Exposed: No Treatment Notes Wound #3 (Foot) Wound Laterality: Dorsal, Right, Proximal Cleanser Soap and Water Discharge Instruction: Gently cleanse wound with antibacterial soap, rinse and pat dry prior to dressing wounds Peri-Wound Care Topical Primary Dressing Prisma 4.34 (in) Discharge Instruction: Moisten w/normal saline or sterile  water; Cover wound as directed. Do not remove from wound bed. Secondary Dressing (BORDER) Zetuvit Plus SILICONE BORDER Dressing 4x4 (in/in) Discharge Instruction: Please do not put silicone bordered dressings under wraps. Use non-bordered dressing only. Secured With Compression Wrap Compression Stockings Facilities manager) Signed: 04/27/2023 6:06:43 PM By: Angelina Pih Signed: 04/28/2023 9:13:48 AM By: Betha Loa Entered By: Betha Loa on 04/27/2023 11:22:33 TYANTHONY, SATHRE ([id number]) [id number] Page 12 of 12 -------------------------------------------------------------------------------- Vitals Details Patient Name: Date of Service: [name] 04/27/2023 1:45 PM Medical Record Number: [id number] Patient Account Number: [id number] Date of Birth/Sex: Treating RN: Mar 28, 1951 (72 y.o. [name] Primary Care Amani Marseille: Kerby Nora Other Clinician: Betha Loa Referring Janaria Mccammon: Treating Neziah Braley/Extender: RO BSO N, MICHA EL Vernie Ammons, [name] in Treatment: 12 Vital Signs Time Taken: 14:09 Temperature (F): 97.9 Height (in): 66 Pulse (bpm): 79 Weight (lbs): 250 Respiratory Rate  (breaths/min): 18 Body Mass Index (BMI): 40.3 Blood Pressure (mmHg): 102/88 Reference Range: 80 - 120 mg / dl Electronic Signature(s) Signed: 04/28/2023 9:13:48 AM By: Betha Loa Entered By: Betha Loa on 04/27/2023 11:12:08

## 2023-04-28 NOTE — Progress Notes (Signed)
FAVION, ROSENCRANCE ([id number]) [id number] Page 1 of 7 Visit Report for 04/27/2023 HPI Details Patient Name: Date of Service: [name] 04/27/2023 1:45 PM Medical Record Number: [id number] Patient Account Number: [id number] Date of Birth/Sex: Treating RN: 10-27-1950 (72 y.o. [name] Primary Care Provider: Kerby Nora Other Clinician: Betha Loa Referring Provider: Treating Provider/Extender: RO BSO N, MICHA EL Vernie Ammons, [name] in Treatment: 12 History of Present Illness HPI Description: 01-31-2023 upon evaluation today patient appears to be doing poorly currently in regard to wounds on his right heel, right dorsal foot, and right lateral foot. Subsequently he does have known peripheral vascular disease and again this is something that he needs to I think Checked formally. This is what the majority of the conversation today hinged around and the patient voiced understanding as far as that is concerned. Fortunately I do not see any signs of active infection locally nor systemically which is great news. Patient does have a history of diabetes mellitus type 2, atrial fibrillation for which she is on long-term anticoagulant Therapy, hypertension, and coronary artery disease. Patient's hemoglobin A1c most recently was on 11-17-2022 and was 7.2 and currently he is on Eliquis and Plavix. 02-07-2023 upon evaluation today patient appears to be doing well currently in regard to his wounds all things considered I feel like we are still maintaining that does not seem like it is any worse is also not significantly better. I discussed with the patient that I do believe he would benefit from the arterial evaluation we still need to get this done as quickly as possible and subsequently we did put in a follow-up call with the vascular office today with regard to this. 02-14-2023 upon evaluation today patient appears to be doing a little better in regard to his wounds  which are showing signs of loosening which is great news. With that being said he is experiencing an improvement overall in his symptoms and we are still waiting on the vascular evaluation want to get this result and consider whether or not his blood flow is good or not we will be able to make a better determination of next steps. For now we will try and avoid any aggressive sharp debridement to know that he has good arterial flow. 02-21-2023 upon evaluation today patient appears to be doing about the same in regard to his wound. Fortunately there does not appear to be any signs of active infection at this time which is great news. No fevers, chills, nausea, vomiting, or diarrhea. I did review patient's arterial study and it appears that he has pretty good flow in the left is not perfect but it is decent. On the right however he is definitely not doing nearly as good and I think that he is going to need to see one of the vascular doctors for further evaluation and treatment of this right leg in order to get these wounds to heal. 02-28-2023 upon evaluation today patient appears to be doing well currently in regard to his wound. He has been tolerating the dressing changes without complication. Fortunately I do not see any evidence of active infection locally nor systemically at this time. I do think that the eschar started to soften up and I would like to try to get some of this off to see if we can get things moving in the right direction. He is in agreement with this plan. 03-09-2023 upon evaluation today patient appears to be doing well currently in regard to  his wound. He has been tolerating the dressing changes without complication. Fortunately there does not appear to be any signs of active infection locally nor systemically at this time which is great news. I do believe clearing out some of the necrotic debris last week has helped to a degree. 03-16-2023 upon evaluation today patient appears to be  doing well currently in regard to his wounds. He is going to be having a vascular angiogram in order to open up blood flow and I think this is going to be very beneficial for him. Fortunately I do not see any evidence of active infection locally nor systemically which is great news. 7/25; patient with predominantly ischemic wounds to on his dorsal foot and 1 on the right heel. He is going for his angiogram on Monday. He has been using the Iodoflex to the wound and changing this dressing himself. 04-11-2023 upon evaluation today patient appears to be doing well currently in regard to his wounds. In fact he had the arterial procedure with vascular and the great news is he actually has much improved blood flow he had a 95% blockage of the popliteal as well as the posterior tibial based on the record review. Post procedure he now has a residual of only about 30% in the popliteal and closer around 10% in the posterior tibial. The blood flow is greatly improved and it is obvious based on what we are seeing. 04-20-2023 upon evaluation today patient appears to be doing well currently in regard to his wounds. They are actually showing signs of excellent improvement and to be honest now that he is got good arterial flow going he is really healing quite nicely. Fortunately I do not see any signs of active infection locally or systemically which is great news. 8/29 this is a patient who has known arterial insufficiency. She has 2 wounds on the dorsal right foot and 1 on the back of the heel. She had an angiogram at the end of July and had follow-up arterial studies yesterday but I cannot pull up the arterial studies. We are using Prisma and Zituvimet and really doing quite well. The wounds are better Electronic Signature(s) Signed: 04/27/2023 5:20:34 PM By: Baltazar Najjar MD Entered By: Baltazar Najjar on 04/27/2023 11:49:54 [name] ([id number]) [id number] Page 2 of  7 -------------------------------------------------------------------------------- Physical Exam Details Patient Name: Date of Service: [name] 04/27/2023 1:45 PM Medical Record Number: [id number] Patient Account Number: [id number] Date of Birth/Sex: Treating RN: 1951/01/18 (72 y.o. [name] Primary Care Provider: Kerby Nora Other Clinician: Betha Loa Referring Provider: Treating Provider/Extender: RO BSO N, MICHA EL Vernie Ammons, [name] in Treatment: 12 Constitutional Sitting or standing Blood Pressure is within target range for patient.. Pulse regular and within target range for patient.Marland Kitchen Respirations regular, non-labored and within target range.. Temperature is normal and within the target range for the patient.Marland Kitchen appears in no distress. Notes Wound exam; all of the wounds were carefully inspected there is smaller the granulation is healthy. No debridement is required in any of the area. The 2 dorsal foot wounds appeared clean in a similar area of the area over the Achilles heel on the right also looks quite good. No evidence of infection Electronic Signature(s) Signed: 04/27/2023 5:20:34 PM By: Baltazar Najjar MD Entered By: Baltazar Najjar on 04/27/2023 11:50:44 -------------------------------------------------------------------------------- Physician Orders Details Patient Name: Date of Service: William Albright HN H. 04/27/2023 1:45 PM Medical Record Number: [id number] Patient Account Number: [id number] Date of Birth/Sex:  Treating RN: 12-24-1950 (72 y.o. [name] Primary Care Provider: Kerby Nora Other Clinician: Betha Loa Referring Provider: Treating Provider/Extender: RO BSO N, MICHA EL Vernie Ammons, [name] in Treatment: 12 Verbal / Phone Orders: Yes Clinician: Angelina Pih Read Back and Verified: Yes Diagnosis Coding Follow-up Appointments Return Appointment in 1 week. Bathing/ [employer] wounds with antibacterial soap  and water. Anesthetic (Use 'Patient Medications' Section for Anesthetic Order Entry) Lidocaine applied to wound bed Wound Treatment Wound #1 - Calcaneus Wound Laterality: Right Cleanser: Soap and Water Every Other Day/30 Days Discharge Instructions: Gently cleanse wound with antibacterial soap, rinse and pat dry prior to dressing wounds RAHKIM, RINGGENBERG ([id number]) [id number] Page 3 of 7 Prim Dressing: Prisma 4.34 (in) Every Other Day/30 Days ary Discharge Instructions: Moisten w/normal saline or sterile water; Cover wound as directed. Do not remove from wound bed. Secondary Dressing: (BORDER) Zetuvit Plus SILICONE BORDER Dressing 4x4 (in/in) (Generic) Every Other Day/30 Days Discharge Instructions: Please do not put silicone bordered dressings under wraps. Use non-bordered dressing only. Wound #2 - Foot Wound Laterality: Dorsal, Right, Distal Cleanser: Soap and Water Every Other Day/30 Days Discharge Instructions: Gently cleanse wound with antibacterial soap, rinse and pat dry prior to dressing wounds Prim Dressing: Prisma 4.34 (in) Every Other Day/30 Days ary Discharge Instructions: Moisten w/normal saline or sterile water; Cover wound as directed. Do not remove from wound bed. Secondary Dressing: (BORDER) Zetuvit Plus SILICONE BORDER Dressing 4x4 (in/in) (Generic) Every Other Day/30 Days Discharge Instructions: Please do not put silicone bordered dressings under wraps. Use non-bordered dressing only. Wound #3 - Foot Wound Laterality: Dorsal, Right, Proximal Cleanser: Soap and Water Every Other Day/30 Days Discharge Instructions: Gently cleanse wound with antibacterial soap, rinse and pat dry prior to dressing wounds Prim Dressing: Prisma 4.34 (in) Every Other Day/30 Days ary Discharge Instructions: Moisten w/normal saline or sterile water; Cover wound as directed. Do not remove from wound bed. Secondary Dressing: (BORDER) Zetuvit Plus SILICONE BORDER Dressing  4x4 (in/in) (Generic) Every Other Day/30 Days Discharge Instructions: Please do not put silicone bordered dressings under wraps. Use non-bordered dressing only. Electronic Signature(s) Signed: 04/27/2023 5:20:34 PM By: Baltazar Najjar MD Signed: 04/28/2023 9:13:48 AM By: Betha Loa Entered By: Betha Loa on 04/27/2023 11:36:55 -------------------------------------------------------------------------------- Problem List Details Patient Name: Date of Service: William Albright HN H. 04/27/2023 1:45 PM Medical Record Number: [id number] Patient Account Number: [id number] Date of Birth/Sex: Treating RN: 07/11/1951 (72 y.o. [name] Primary Care Provider: Kerby Nora Other Clinician: Betha Loa Referring Provider: Treating Provider/Extender: RO BSO N, MICHA EL Vernie Ammons, [name] in Treatment: 12 Active Problems ICD-10 Encounter Code Description Active Date MDM Diagnosis E11.621 Type 2 diabetes mellitus with foot ulcer 01/31/2023 No Yes L97.512 Non-pressure chronic ulcer of other part of right foot with fat layer exposed 01/31/2023 No Yes I25.10 Atherosclerotic heart disease of native coronary artery without angina pectoris 01/31/2023 No Yes I48.0 Paroxysmal atrial fibrillation 01/31/2023 No Yes RUTVIK, REUTER ([id number]) [id number] Page 4 of 7 Z79.01 Long term (current) use of anticoagulants 01/31/2023 No Yes I10 Essential (primary) hypertension 01/31/2023 No Yes Inactive Problems Resolved Problems Electronic Signature(s) Signed: 04/27/2023 5:20:34 PM By: Baltazar Najjar MD Entered By: Baltazar Najjar on 04/27/2023 11:48:47 -------------------------------------------------------------------------------- Progress Note Details Patient Name: Date of Service: William Albright HN H. 04/27/2023 1:45 PM Medical Record Number: [id number] Patient Account Number: [id number] Date of Birth/Sex: Treating RN: 1951/03/05 (72 y.o. [name] Primary Care Provider:  Kerby Nora Other Clinician: Betha Loa  Referring Provider: Treating Provider/Extender: RO BSO N, MICHA EL G Bedsole, [name] in Treatment: 12 Subjective History of Present Illness (HPI) 01-31-2023 upon evaluation today patient appears to be doing poorly currently in regard to wounds on his right heel, right dorsal foot, and right lateral foot. Subsequently he does have known peripheral vascular disease and again this is something that he needs to I think Checked formally. This is what the majority of the conversation today hinged around and the patient voiced understanding as far as that is concerned. Fortunately I do not see any signs of active infection locally nor systemically which is great news. Patient does have a history of diabetes mellitus type 2, atrial fibrillation for which she is on long-term anticoagulant Therapy, hypertension, and coronary artery disease. Patient's hemoglobin A1c most recently was on 11-17-2022 and was 7.2 and currently he is on Eliquis and Plavix. 02-07-2023 upon evaluation today patient appears to be doing well currently in regard to his wounds all things considered I feel like we are still maintaining that does not seem like it is any worse is also not significantly better. I discussed with the patient that I do believe he would benefit from the arterial evaluation we still need to get this done as quickly as possible and subsequently we did put in a follow-up call with the vascular office today with regard to this. 02-14-2023 upon evaluation today patient appears to be doing a little better in regard to his wounds which are showing signs of loosening which is great news. With that being said he is experiencing an improvement overall in his symptoms and we are still waiting on the vascular evaluation want to get this result and consider whether or not his blood flow is good or not we will be able to make a better determination of next steps. For now we will  try and avoid any aggressive sharp debridement to know that he has good arterial flow. 02-21-2023 upon evaluation today patient appears to be doing about the same in regard to his wound. Fortunately there does not appear to be any signs of active infection at this time which is great news. No fevers, chills, nausea, vomiting, or diarrhea. I did review patient's arterial study and it appears that he has pretty good flow in the left is not perfect but it is decent. On the right however he is definitely not doing nearly as good and I think that he is going to need to see one of the vascular doctors for further evaluation and treatment of this right leg in order to get these wounds to heal. 02-28-2023 upon evaluation today patient appears to be doing well currently in regard to his wound. He has been tolerating the dressing changes without complication. Fortunately I do not see any evidence of active infection locally nor systemically at this time. I do think that the eschar started to soften up and I would like to try to get some of this off to see if we can get things moving in the right direction. He is in agreement with this plan. 03-09-2023 upon evaluation today patient appears to be doing well currently in regard to his wound. He has been tolerating the dressing changes without complication. Fortunately there does not appear to be any signs of active infection locally nor systemically at this time which is great news. I do believe clearing out some of the necrotic debris last week has helped to a degree. 03-16-2023 upon evaluation today patient appears  to be doing well currently in regard to his wounds. He is going to be having a vascular angiogram in order to open up blood flow and I think this is going to be very beneficial for him. Fortunately I do not see any evidence of active infection locally nor systemically which is great news. [name], [name] ([id number]) [id number]  Page 5 of 7 7/25; patient with predominantly ischemic wounds to on his dorsal foot and 1 on the right heel. He is going for his angiogram on Monday. He has been using the Iodoflex to the wound and changing this dressing himself. 04-11-2023 upon evaluation today patient appears to be doing well currently in regard to his wounds. In fact he had the arterial procedure with vascular and the great news is he actually has much improved blood flow he had a 95% blockage of the popliteal as well as the posterior tibial based on the record review. Post procedure he now has a residual of only about 30% in the popliteal and closer around 10% in the posterior tibial. The blood flow is greatly improved and it is obvious based on what we are seeing. 04-20-2023 upon evaluation today patient appears to be doing well currently in regard to his wounds. They are actually showing signs of excellent improvement and to be honest now that he is got good arterial flow going he is really healing quite nicely. Fortunately I do not see any signs of active infection locally or systemically which is great news. 8/29 this is a patient who has known arterial insufficiency. She has 2 wounds on the dorsal right foot and 1 on the back of the heel. She had an angiogram at the end of July and had follow-up arterial studies yesterday but I cannot pull up the arterial studies. We are using Prisma and Zituvimet and really doing quite well. The wounds are better Objective Constitutional Sitting or standing Blood Pressure is within target range for patient.. Pulse regular and within target range for patient.Marland Kitchen Respirations regular, non-labored and within target range.. Temperature is normal and within the target range for the patient.Marland Kitchen appears in no distress. Vitals Time Taken: 2:09 PM, Height: 66 in, Weight: 250 lbs, BMI: 40.3, Temperature: 97.9 F, Pulse: 79 bpm, Respiratory Rate: 18 breaths/min, Blood Pressure: 102/88 mmHg. General Notes:  Wound exam; all of the wounds were carefully inspected there is smaller the granulation is healthy. No debridement is required in any of the area. The 2 dorsal foot wounds appeared clean in a similar area of the area over the Achilles heel on the right also looks quite good. No evidence of infection Integumentary (Hair, Skin) Wound #1 status is Open. Original cause of wound was Gradually Appeared. The date acquired was: 12/10/2022. The wound has been in treatment 12 weeks. The wound is located on the Right Calcaneus. The wound measures 0.5cm length x 0.3cm width x 0.2cm depth; 0.118cm^2 area and 0.024cm^3 volume. There is Fat Layer (Subcutaneous Tissue) exposed. There is a medium amount of serosanguineous drainage noted. There is medium (34-66%) red granulation within the wound bed. There is no necrotic tissue within the wound bed. Wound #2 status is Open. Original cause of wound was Gradually Appeared. The date acquired was: 12/10/2022. The wound has been in treatment 12 weeks. The wound is located on the Right,Distal,Dorsal Foot. The wound measures 0.4cm length x 0.5cm width x 0.2cm depth; 0.157cm^2 area and 0.031cm^3 volume. There is Fat Layer (Subcutaneous Tissue) exposed. There is a medium amount of serosanguineous  drainage noted. There is no granulation within the wound bed. There is no necrotic tissue within the wound bed. Wound #3 status is Open. Original cause of wound was Gradually Appeared. The date acquired was: 12/10/2022. The wound has been in treatment 12 weeks. The wound is located on the Right,Proximal,Dorsal Foot. The wound measures 0.7cm length x 0.8cm width x 0.1cm depth; 0.44cm^2 area and 0.044cm^3 volume. There is Fat Layer (Subcutaneous Tissue) exposed. There is a medium amount of serosanguineous drainage noted. There is no granulation within the wound bed. There is a large (67-100%) amount of necrotic tissue within the wound bed including Adherent Slough. Assessment Active  Problems ICD-10 Type 2 diabetes mellitus with foot ulcer Non-pressure chronic ulcer of other part of right foot with fat layer exposed Atherosclerotic heart disease of native coronary artery without angina pectoris Paroxysmal atrial fibrillation Long term (current) use of anticoagulants Essential (primary) hypertension Plan Follow-up Appointments: Return Appointment in 1 week. Bathing/ Shower/ Hygiene: Wash wounds with antibacterial soap and water. Anesthetic (Use 'Patient Medications' Section for Anesthetic Order Entry): Lidocaine applied to wound bed WOUND #1: - Calcaneus Wound Laterality: Right Cleanser: Soap and Water Every Other Day/30 Days Discharge Instructions: Gently cleanse wound with antibacterial soap, rinse and pat dry prior to dressing wounds Prim Dressing: Prisma 4.34 (in) Every Other Day/30 Days ary Discharge Instructions: Moisten w/normal saline or sterile water; Cover wound as directed. Do not remove from wound bed. Secondary Dressing: (BORDER) Zetuvit Plus SILICONE BORDER Dressing 4x4 (in/in) (Generic) Every Other Day/30 Days ARTRELL, MATTO ([id number]) [id number] Page 6 of 7 Discharge Instructions: Please do not put silicone bordered dressings under wraps. Use non-bordered dressing only. WOUND #2: - Foot Wound Laterality: Dorsal, Right, Distal Cleanser: Soap and Water Every Other Day/30 Days Discharge Instructions: Gently cleanse wound with antibacterial soap, rinse and pat dry prior to dressing wounds Prim Dressing: Prisma 4.34 (in) Every Other Day/30 Days ary Discharge Instructions: Moisten w/normal saline or sterile water; Cover wound as directed. Do not remove from wound bed. Secondary Dressing: (BORDER) Zetuvit Plus SILICONE BORDER Dressing 4x4 (in/in) (Generic) Every Other Day/30 Days Discharge Instructions: Please do not put silicone bordered dressings under wraps. Use non-bordered dressing only. WOUND #3: - Foot Wound  Laterality: Dorsal, Right, Proximal Cleanser: Soap and Water Every Other Day/30 Days Discharge Instructions: Gently cleanse wound with antibacterial soap, rinse and pat dry prior to dressing wounds Prim Dressing: Prisma 4.34 (in) Every Other Day/30 Days ary Discharge Instructions: Moisten w/normal saline or sterile water; Cover wound as directed. Do not remove from wound bed. Secondary Dressing: (BORDER) Zetuvit Plus SILICONE BORDER Dressing 4x4 (in/in) (Generic) Every Other Day/30 Days Discharge Instructions: Please do not put silicone bordered dressings under wraps. Use non-bordered dressing only. 1. [name] wounds are improving we are using Prisma and sit to vet 2. Arterial studies were done yesterday in follow-up for a recent angiograph but I have not reviewed these records currently. Will try to get this before the end of the workday. 3. The wounds are all a lot better Electronic Signature(s) Signed: 04/27/2023 5:20:34 PM By: Baltazar Najjar MD Entered By: Baltazar Najjar on 04/27/2023 11:51:49 -------------------------------------------------------------------------------- SuperBill Details Patient Name: Date of Service: William Albright HN H. 04/27/2023 Medical Record Number: [id number] Patient Account Number: [id number] Date of Birth/Sex: Treating RN: [birth year] (72 y.o. [name] Primary Care Provider: Kerby Nora Other Clinician: Betha Loa Referring Provider: Treating Provider/Extender: RO BSO N, MICHA EL Vernie Ammons, [name] in Treatment: 12 Diagnosis Coding ICD-10 Codes  Code Description E11.621 Type 2 diabetes mellitus with foot ulcer L97.512 Non-pressure chronic ulcer of other part of right foot with fat layer exposed I48.0 Paroxysmal atrial fibrillation Z79.01 Long term (current) use of anticoagulants I10 Essential (primary) hypertension I25.10 Atherosclerotic heart disease of native coronary artery without angina pectoris Facility Procedures : CPT4  Code: 78295621 Description: 99214 - WOUND CARE VISIT-LEV 4 EST PT Modifier: Quantity: 1 Physician Procedures Electronic Signature(s) Signed: 04/27/2023 5:20:34 PM By: Baltazar Najjar MD Entered By: Baltazar Najjar on 04/27/2023 11:52:17

## 2023-04-28 NOTE — Progress Notes (Signed)
[name] ([id number]) [id number] Page 1 of 12 Visit Report for 01/31/2023 Chief Complaint Document Details Patient Name: Date of Service: [name] [facility] [name]. 01/31/2023 2:00 PM Medical Record Number: [id number] Patient Account Number: [id number] Date of Birth/Sex: Treating RN: [birth year] (72 y.o. [name] Primary Care Provider: Kerby Nora Other Clinician: Referring Provider: Treating Provider/Extender: [name], [name] in Treatment: 0 Information Obtained from: Patient Chief Complaint Right foot ulcers Electronic Signature(s) Signed: 01/31/2023 3:11:28 PM By: Allen Derry PA-C Entered By: Allen Derry on 01/31/2023 12:11:28 -------------------------------------------------------------------------------- Debridement Details Patient Name: Date of Service: [name] [name] 01/31/2023 2:00 PM Medical Record Number: [id number] Patient Account Number: [id number] Date of Birth/Sex: Treating RN: April 03, 1951 (72 y.o. [name] Primary Care Provider: Kerby Nora Other Clinician: Referring Provider: Treating Provider/Extender: [name], [name] in Treatment: 0 Debridement Performed for Assessment: Wound #1 Right Calcaneus Performed By: Physician Allen Derry, PA-C Debridement Type: Chemical/Enzymatic/Mechanical Agent Used: saline gauze Severity of Tissue Pre Debridement: Fat layer exposed Level of Consciousness (Pre-procedure): Awake and Alert Pre-procedure Verification/Time Out Yes - 15:20 Taken: Start Time: 15:20 Pain Control: Lidocaine 4% Topical Solution Percent of Wound Bed Debrided: Instrument: Other : saline gauze Bleeding: None Procedural Pain: 0 Post Procedural Pain: 0 Response to Treatment: Procedure was tolerated well Level of Consciousness (Post- Awake and Alert procedure): [name], [name] ([id number]) [id number] Page 2 of 12 Post Debridement Measurements of Total Wound Length:  (cm) 3.2 Width: (cm) 1.7 Depth: (cm) 0.1 Volume: (cm) 0.427 Character of Wound/Ulcer Post Debridement: Stable Severity of Tissue Post Debridement: Fat layer exposed Post Procedure Diagnosis Same as Pre-procedure Electronic Signature(s) Signed: 01/31/2023 4:45:25 PM By: Midge Aver MSN RN CNS WTA Signed: 01/31/2023 5:37:24 PM By: Allen Derry PA-C Entered By: Midge Aver on 01/31/2023 12:21:16 -------------------------------------------------------------------------------- Debridement Details Patient Name: Date of Service: [name] [name] 01/31/2023 2:00 PM Medical Record Number: [id number] Patient Account Number: [id number] Date of Birth/Sex: Treating RN: 07-01-1951 (72 y.o. [name] Primary Care Provider: Kerby Nora Other Clinician: Referring Provider: Treating Provider/Extender: [name], [name] in Treatment: 0 Debridement Performed for Assessment: Wound #2 Dorsal,Anterior Foot Performed By: Physician Allen Derry, PA-C Debridement Type: Chemical/Enzymatic/Mechanical Agent Used: saline gauze Severity of Tissue Pre Debridement: Fat layer exposed Level of Consciousness (Pre-procedure): Awake and Alert Pre-procedure Verification/Time Out Yes - 15:20 Taken: Start Time: 15:20 Pain Control: Lidocaine 4% Topical Solution Percent of Wound Bed Debrided: Instrument: Other : saline gauze Bleeding: None Procedural Pain: 0 Post Procedural Pain: 0 Response to Treatment: Procedure was tolerated well Level of Consciousness (Post- Awake and Alert procedure): Post Debridement Measurements of Total Wound Length: (cm) 1 Width: (cm) 1.1 Depth: (cm) 0.1 Volume: (cm) 0.086 Character of Wound/Ulcer Post Debridement: Stable Severity of Tissue Post Debridement: Fat layer exposed Post Procedure Diagnosis Same as Pre-procedure Electronic Signature(s) Signed: 01/31/2023 4:45:25 PM By: Midge Aver MSN RN CNS WTA Signed: 01/31/2023 5:37:24 PM By: Allen Derry PA-C Entered By:  Midge Aver on 01/31/2023 12:21:45 [name] ([id number]) [id number] Page 3 of 12 -------------------------------------------------------------------------------- Debridement Details Patient Name: Date of Service: [name] [facility] [name]. 01/31/2023 2:00 PM Medical Record Number: [id number] Patient Account Number: [id number] Date of Birth/Sex: Treating RN: 01-07-1951 (72 y.o. [name] Primary Care Provider: Kerby Nora Other Clinician: Referring Provider: Treating Provider/Extender: [name], [name] in Treatment: 0 Debridement Performed for Assessment: Wound #3 Right,Lateral Foot Performed By: Physician Allen Derry, PA-C Debridement Type: Chemical/Enzymatic/Mechanical Agent Used: saline  gauze Severity of Tissue Pre Debridement: Fat layer exposed Level of Consciousness (Pre-procedure): Awake and Alert Pre-procedure Verification/Time Out Yes - 15:20 Taken: Start Time: 15:20 Pain Control: Lidocaine 4% Topical Solution Percent of Wound Bed Debrided: Instrument: Other : saline gauze Bleeding: None Procedural Pain: 0 Post Procedural Pain: 0 Response to Treatment: Procedure was tolerated well Level of Consciousness (Post- Awake and Alert procedure): Post Debridement Measurements of Total Wound Length: (cm) 1.6 Width: (cm) 2 Depth: (cm) 0.1 Volume: (cm) 0.251 Character of Wound/Ulcer Post Debridement: Stable Severity of Tissue Post Debridement: Fat layer exposed Post Procedure Diagnosis Same as Pre-procedure Electronic Signature(s) Signed: 01/31/2023 4:45:25 PM By: Midge Aver MSN RN CNS WTA Signed: 01/31/2023 5:37:24 PM By: Allen Derry PA-C Entered By: Midge Aver on 01/31/2023 12:22:15 -------------------------------------------------------------------------------- HPI Details Patient Name: Date of Service: [name] [name] 01/31/2023 2:00 PM Medical Record Number: [id number] Patient Account Number: [id number] Date of Birth/Sex:  Treating RN: May 26, 1951 (72 y.o. [name] ([id number]) [id number] Page 4 of 12 Primary Care Provider: Kerby Nora Other Clinician: Referring Provider: Treating Provider/Extender: [name], [name] in Treatment: 0 History of Present Illness HPI Description: 01-31-2023 upon evaluation today patient appears to be doing poorly currently in regard to wounds on his right heel, right dorsal foot, and right lateral foot. Subsequently he does have known peripheral vascular disease and again this is something that he needs to I think Checked formally. This is what the majority of the conversation today hinged around and the patient voiced understanding as far as that is concerned. Fortunately I do not see any signs of active infection locally nor systemically which is great news. Patient does have a history of diabetes mellitus type 2, atrial fibrillation for which she is on long-term anticoagulant Therapy, hypertension, and coronary artery disease. Patient's hemoglobin A1c most recently was on 11-17-2022 and was 7.2 and currently he is on Eliquis and Plavix. Electronic Signature(s) Signed: 02/02/2023 4:10:50 PM By: Allen Derry PA-C Previous Signature: 01/31/2023 5:24:03 PM Version By: Allen Derry PA-C Entered By: Allen Derry on 02/02/2023 13:10:50 -------------------------------------------------------------------------------- Physical Exam Details Patient Name: Date of Service: [name] [name] 01/31/2023 2:00 PM Medical Record Number: [id number] Patient Account Number: [id number] Date of Birth/Sex: Treating RN: 1951/08/24 (72 y.o. [name] Primary Care Provider: Kerby Nora Other Clinician: Referring Provider: Treating Provider/Extender: [name], [name] in Treatment: 0 Constitutional sitting or standing blood pressure is within target range for patient.. pulse regular and within target range for patient.Marland Kitchen  respirations regular, non-labored and within target range for patient.Marland Kitchen temperature within target range for patient.. Well-nourished and well-hydrated in no acute distress. Eyes conjunctiva clear no eyelid edema noted. pupils equal round and reactive to light and accommodation. Ears, Nose, Mouth, and Throat no gross abnormality of ear auricles or external auditory canals. normal hearing noted during conversation. mucus membranes moist. Respiratory normal breathing without difficulty. Cardiovascular 1+ dorsalis pedis/posterior tibialis pulses. 1+ pitting edema of the bilateral lower extremities. Musculoskeletal Patient unable to walk without assistance. Psychiatric this patient is able to make decisions and demonstrates good insight into disease process. Alert and Oriented x 3. pleasant and cooperative. Notes Upon inspection patient's wounds all appear to be necrotic and again I really think that peripheral flow is probably a big culprit here. I do think he needs to be seen by vascular ASAP for evaluation of his arterial flow with a arterial study with ABI and TBI. 2. I am good recommend as  well that for the time being we will probably get her want to be very conservative and how we manage his situation currently and the patient voiced understanding. He is in agreement with that plan. Electronic Signature(s) Signed: 02/02/2023 4:31:50 PM By: Allen Derry PA-C Entered By: Allen Derry on 02/02/2023 13:31:50 [name], [name] ([id number]) [id number] Page 5 of 12 -------------------------------------------------------------------------------- Physician Orders Details Patient Name: Date of Service: [name] [facility] [name]. 01/31/2023 2:00 PM Medical Record Number: [id number] Patient Account Number: [id number] Date of Birth/Sex: Treating RN: 11-28-1950 (72 y.o. [name] Primary Care Provider: Kerby Nora Other Clinician: Referring Provider: Treating Provider/Extender:  [name], [name] in Treatment: 0 Verbal / Phone Orders: No Diagnosis Coding ICD-10 Coding Code Description E11.621 Type 2 diabetes mellitus with foot ulcer L97.512 Non-pressure chronic ulcer of other part of right foot with fat layer exposed I48.0 Paroxysmal atrial fibrillation Z79.01 Long term (current) use of anticoagulants I10 Essential (primary) hypertension I25.10 Atherosclerotic heart disease of native coronary artery without angina pectoris Follow-up Appointments Return Appointment in 1 week. Bathing/ [employer] wounds with antibacterial soap and water. Anesthetic (Use 'Patient Medications' Section for Anesthetic Order Entry) Lidocaine applied to wound bed Wound Treatment Wound #1 - Calcaneus Wound Laterality: Right Cleanser: Byram Ancillary Kit - 15 Day Supply (DME) (Generic) 3 x Per Week/30 Days Discharge Instructions: Use supplies as instructed; Kit contains: (15) Saline Bullets; (15) 3x3 Gauze; 15 pr Gloves Cleanser: Soap and Water 3 x Per Week/30 Days Discharge Instructions: Gently cleanse wound with antibacterial soap, rinse and pat dry prior to dressing wounds Prim Dressing: IODOFLEX 0.9% Cadexomer Iodine Pad (DME) (Generic) 3 x Per Week/30 Days ary Discharge Instructions: Apply Iodoflex to wound bed only as directed. Secondary Dressing: (BORDER) Zetuvit Plus SILICONE BORDER Dressing 4x4 (in/in) (DME) (Generic) 3 x Per Week/30 Days Discharge Instructions: Please do not put silicone bordered dressings under wraps. Use non-bordered dressing only. Wound #2 - Foot Wound Laterality: Dorsal, Right, Distal Cleanser: Byram Ancillary Kit - 15 Day Supply (DME) (Generic) 3 x Per Week/30 Days Discharge Instructions: Use supplies as instructed; Kit contains: (15) Saline Bullets; (15) 3x3 Gauze; 15 pr Gloves Cleanser: Soap and Water 3 x Per Week/30 Days Discharge Instructions: Gently cleanse wound with antibacterial soap, rinse and pat dry prior to dressing  wounds Prim Dressing: IODOFLEX 0.9% Cadexomer Iodine Pad (DME) (Generic) 3 x Per Week/30 Days ary Discharge Instructions: Apply Iodoflex to wound bed only as directed. Secondary Dressing: (BORDER) Zetuvit Plus SILICONE BORDER Dressing 4x4 (in/in) (DME) (Generic) 3 x Per Week/30 Days Discharge Instructions: Please do not put silicone bordered dressings under wraps. Use non-bordered dressing only. Wound #3 - Foot Wound Laterality: Dorsal, Right, Proximal Cleanser: Soap and Water 3 x Per Week/30 Days Discharge Instructions: Gently cleanse wound with antibacterial soap, rinse and pat dry prior to dressing wounds [name], [name] ([id number]) [id number] Page 6 of 12 Prim Dressing: IODOFLEX 0.9% Cadexomer Iodine Pad (DME) (Generic) 3 x Per Week/30 Days ary Discharge Instructions: Apply Iodoflex to wound bed only as directed. Secondary Dressing: (BORDER) Zetuvit Plus SILICONE BORDER Dressing 4x4 (in/in) (DME) (Generic) 3 x Per Week/30 Days Discharge Instructions: Please do not put silicone bordered dressings under wraps. Use non-bordered dressing only. Services and Therapies Ankle Brachial Index (ABI) Electronic Signature(s) Signed: 02/27/2023 3:10:51 PM By: Allen Derry PA-C Signed: 04/28/2023 2:43:20 PM By: Midge Aver MSN RN CNS WTA Previous Signature: 01/31/2023 4:45:25 PM Version By: Midge Aver MSN RN CNS WTA Previous Signature: 01/31/2023 5:37:24 PM  Version By: Allen Derry PA-C Entered By: Midge Aver on 02/21/2023 13:28:23 -------------------------------------------------------------------------------- Problem List Details Patient Name: Date of Service: [name] [name] 01/31/2023 2:00 PM Medical Record Number: [id number] Patient Account Number: [id number] Date of Birth/Sex: Treating RN: Apr 27, 1951 (72 y.o. [name] Primary Care Provider: Kerby Nora Other Clinician: Referring Provider: Treating Provider/Extender: [name], [name] in  Treatment: 0 Active Problems ICD-10 Encounter Code Description Active Date MDM Diagnosis E11.621 Type 2 diabetes mellitus with foot ulcer 01/31/2023 No Yes L97.512 Non-pressure chronic ulcer of other part of right foot with fat layer exposed 01/31/2023 No Yes I48.0 Paroxysmal atrial fibrillation 01/31/2023 No Yes Z79.01 Long term (current) use of anticoagulants 01/31/2023 No Yes I10 Essential (primary) hypertension 01/31/2023 No Yes I25.10 Atherosclerotic heart disease of native coronary artery without angina pectoris 01/31/2023 No Yes Inactive Problems Resolved Problems DEVAANSH, DOWSETT ([id number]) [id number] Page 7 of 12 Electronic Signature(s) Signed: 01/31/2023 3:11:03 PM By: Allen Derry PA-C Entered By: Allen Derry on 01/31/2023 12:11:03 -------------------------------------------------------------------------------- Progress Note Details Patient Name: Date of Service: [name] [name] 01/31/2023 2:00 PM Medical Record Number: [id number] Patient Account Number: [id number] Date of Birth/Sex: Treating RN: Feb 19, 1951 (72 y.o. [name] Primary Care Provider: Kerby Nora Other Clinician: Referring Provider: Treating Provider/Extender: [name], [name] in Treatment: 0 Subjective Chief Complaint Information obtained from Patient Right foot ulcers History of Present Illness (HPI) 01-31-2023 upon evaluation today patient appears to be doing poorly currently in regard to wounds on his right heel, right dorsal foot, and right lateral foot. Subsequently he does have known peripheral vascular disease and again this is something that he needs to I think Checked formally. This is what the majority of the conversation today hinged around and the patient voiced understanding as far as that is concerned. Fortunately I do not see any signs of active infection locally nor systemically which is great news. Patient does have a history of diabetes mellitus type 2,  atrial fibrillation for which she is on long-term anticoagulant Therapy, hypertension, and coronary artery disease. Patient's hemoglobin A1c most recently was on 11-17-2022 and was 7.2 and currently he is on Eliquis and Plavix. Patient History Information obtained from Patient. Allergies No Known Allergies Social History Never smoker, Marital Status - Widowed, Alcohol Use - Never, Drug Use - No History, Caffeine Use - Moderate. Medical History Cardiovascular Patient has history of Congestive Heart Failure, Coronary Artery Disease, Hypertension Endocrine Patient has history of Type II Diabetes Neurologic Patient has history of Neuropathy Patient is treated with Insulin, Oral Agents. Blood sugar is tested. Blood sugar results noted at the following times: Lunch - 92. Medical A Surgical History Notes nd Eyes Retinopathy Review of Systems (ROS) Constitutional Symptoms (General Health) Denies complaints or symptoms of Fatigue, Fever, Chills, Marked Weight Change. Ear/Nose/Mouth/Throat Denies complaints or symptoms of Difficult clearing ears, Sinusitis. Hematologic/Lymphatic Denies complaints or symptoms of Bleeding / Clotting Disorders, Human Immunodeficiency Virus. Respiratory Denies complaints or symptoms of Chronic or frequent coughs, Shortness of Breath. Gastrointestinal Denies complaints or symptoms of Frequent diarrhea, Nausea, Vomiting. Genitourinary Denies complaints or symptoms of Kidney failure/ Dialysis, Incontinence/dribbling. Immunological Denies complaints or symptoms of Hives, Itching. [name], [name] ([id number]) [id number] Page 8 of 12 Integumentary (Skin) Denies complaints or symptoms of Wounds, Bleeding or bruising tendency, Breakdown, Swelling. Musculoskeletal Denies complaints or symptoms of Muscle Pain, Muscle Weakness. Psychiatric Denies complaints or symptoms of Anxiety, Claustrophobia. Objective Constitutional sitting or  standing blood pressure is within target range  for patient.. pulse regular and within target range for patient.Marland Kitchen respirations regular, non-labored and within target range for patient.Marland Kitchen temperature within target range for patient.. Well-nourished and well-hydrated in no acute distress. Vitals Time Taken: 2:24 PM, Height: 66 in, Source: Stated, Weight: 250 lbs, Source: Stated, BMI: 40.3, Temperature: 98.0 F, Pulse: 81 bpm, Respiratory Rate: 16 breaths/min, Blood Pressure: 115/69 mmHg. Eyes conjunctiva clear no eyelid edema noted. pupils equal round and reactive to light and accommodation. Ears, Nose, Mouth, and Throat no gross abnormality of ear auricles or external auditory canals. normal hearing noted during conversation. mucus membranes moist. Respiratory normal breathing without difficulty. Cardiovascular 1+ dorsalis pedis/posterior tibialis pulses. 1+ pitting edema of the bilateral lower extremities. Musculoskeletal Patient unable to walk without assistance. Psychiatric this patient is able to make decisions and demonstrates good insight into disease process. Alert and Oriented x 3. pleasant and cooperative. General Notes: Upon inspection patient's wounds all appear to be necrotic and again I really think that peripheral flow is probably a big culprit here. I do think he needs to be seen by vascular ASAP for evaluation of his arterial flow with a arterial study with ABI and TBI. 2. I am good recommend as well that for the time being we will probably get her want to be very conservative and how we manage his situation currently and the patient voiced understanding. He is in agreement with that plan. Integumentary (Hair, Skin) Wound #1 status is Open. Original cause of wound was Gradually Appeared. The date acquired was: 12/10/2022. The wound is located on the Right Calcaneus. The wound measures 3.2cm length x 1.7cm width x 0.1cm depth; 4.273cm^2 area and 0.427cm^3 volume. There is Fat Layer  (Subcutaneous Tissue) exposed. There is a medium amount of serosanguineous drainage noted. There is medium (34-66%) red granulation within the wound bed. There is no necrotic tissue within the wound bed. Wound #2 status is Open. Original cause of wound was Gradually Appeared. The date acquired was: 12/10/2022. The wound is located on the Dorsal,Anterior Foot. The wound measures 1cm length x 1.1cm width x 0.1cm depth; 0.864cm^2 area and 0.086cm^3 volume. There is Fat Layer (Subcutaneous Tissue) exposed. There is no tunneling or undermining noted. There is a medium amount of serosanguineous drainage noted. There is no granulation within the wound bed. There is no necrotic tissue within the wound bed. Wound #3 status is Open. Original cause of wound was Gradually Appeared. The date acquired was: 12/10/2022. The wound is located on the Right,Lateral Foot. The wound measures 1.6cm length x 2cm width x 0.1cm depth; 2.513cm^2 area and 0.251cm^3 volume. There is Fat Layer (Subcutaneous Tissue) exposed. There is no tunneling or undermining noted. There is a medium amount of serosanguineous drainage noted. There is small (1-33%) red granulation within the wound bed. There is no necrotic tissue within the wound bed. Assessment Active Problems ICD-10 Type 2 diabetes mellitus with foot ulcer Non-pressure chronic ulcer of other part of right foot with fat layer exposed Paroxysmal atrial fibrillation Long term (current) use of anticoagulants Essential (primary) hypertension Atherosclerotic heart disease of native coronary artery without angina pectoris Procedures Wound #1 [name], [name] ([id number]) [id number] Page 9 of 12 Pre-procedure diagnosis of Wound #1 is a Diabetic Wound/Ulcer of the Lower Extremity located on the Right Calcaneus .Severity of Tissue Pre Debridement is: Fat layer exposed. There was a Chemical/Enzymatic/Mechanical debridement performed by Allen Derry, PA-C.  With the following instrument(s): saline gauze after achieving pain control using Lidocaine 4% Topical Solution. Other agent used  was saline gauze. A time out was conducted at 15:20, prior to the start of the procedure. There was no bleeding. The procedure was tolerated well with a pain level of 0 throughout and a pain level of 0 following the procedure. Post Debridement Measurements: 3.2cm length x 1.7cm width x 0.1cm depth; 0.427cm^3 volume. Character of Wound/Ulcer Post Debridement is stable. Severity of Tissue Post Debridement is: Fat layer exposed. Post procedure Diagnosis Wound #1: Same as Pre-Procedure Wound #2 Pre-procedure diagnosis of Wound #2 is a Diabetic Wound/Ulcer of the Lower Extremity located on the Dorsal,Anterior Foot .Severity of Tissue Pre Debridement is: Fat layer exposed. There was a Chemical/Enzymatic/Mechanical debridement performed by Allen Derry, PA-C. With the following instrument(s): saline gauze after achieving pain control using Lidocaine 4% Topical Solution. Other agent used was saline gauze. A time out was conducted at 15:20, prior to the start of the procedure. There was no bleeding. The procedure was tolerated well with a pain level of 0 throughout and a pain level of 0 following the procedure. Post Debridement Measurements: 1cm length x 1.1cm width x 0.1cm depth; 0.086cm^3 volume. Character of Wound/Ulcer Post Debridement is stable. Severity of Tissue Post Debridement is: Fat layer exposed. Post procedure Diagnosis Wound #2: Same as Pre-Procedure Wound #3 Pre-procedure diagnosis of Wound #3 is a Diabetic Wound/Ulcer of the Lower Extremity located on the Right,Lateral Foot .Severity of Tissue Pre Debridement is: Fat layer exposed. There was a Chemical/Enzymatic/Mechanical debridement performed by Allen Derry, PA-C. With the following instrument(s): saline gauze after achieving pain control using Lidocaine 4% Topical Solution. Other agent used was saline gauze. A  time out was conducted at 15:20, prior to the start of the procedure. There was no bleeding. The procedure was tolerated well with a pain level of 0 throughout and a pain level of 0 following the procedure. Post Debridement Measurements: 1.6cm length x 2cm width x 0.1cm depth; 0.251cm^3 volume. Character of Wound/Ulcer Post Debridement is stable. Severity of Tissue Post Debridement is: Fat layer exposed. Post procedure Diagnosis Wound #3: Same as Pre-Procedure Plan Follow-up Appointments: Return Appointment in 1 week. Bathing/ Shower/ Hygiene: Wash wounds with antibacterial soap and water. Anesthetic (Use 'Patient Medications' Section for Anesthetic Order Entry): Lidocaine applied to wound bed WOUND #1: - Calcaneus Wound Laterality: Right Cleanser: Byram Ancillary Kit - 15 Day Supply (DME) (Generic) 3 x Per Week/30 Days Discharge Instructions: Use supplies as instructed; Kit contains: (15) Saline Bullets; (15) 3x3 Gauze; 15 pr Gloves Cleanser: Soap and Water 3 x Per Week/30 Days Discharge Instructions: Gently cleanse wound with antibacterial soap, rinse and pat dry prior to dressing wounds Prim Dressing: IODOFLEX 0.9% Cadexomer Iodine Pad (DME) (Generic) 3 x Per Week/30 Days ary Discharge Instructions: Apply Iodoflex to wound bed only as directed. Secondary Dressing: (BORDER) Zetuvit Plus SILICONE BORDER Dressing 4x4 (in/in) (DME) (Generic) 3 x Per Week/30 Days Discharge Instructions: Please do not put silicone bordered dressings under wraps. Use non-bordered dressing only. WOUND #2: - Foot Wound Laterality: Dorsal, Anterior Cleanser: [place]ram Ancillary Kit - 15 Day Supply (DME) (Generic) 3 x Per Week/30 Days Discharge Instructions: Use supplies as instructed; Kit contains: (15) Saline Bullets; (15) 3x3 Gauze; 15 pr Gloves Cleanser: Soap and Water 3 x Per Week/30 Days Discharge Instructions: Gently cleanse wound with antibacterial soap, rinse and pat dry prior to dressing wounds Prim  Dressing: IODOFLEX 0.9% Cadexomer Iodine Pad (DME) (Generic) 3 x Per Week/30 Days ary Discharge Instructions: Apply Iodoflex to wound bed only as directed. Secondary Dressing: (BORDER) Zetuvit  Plus SILICONE BORDER Dressing 4x4 (in/in) (DME) (Generic) 3 x Per Week/30 Days Discharge Instructions: Please do not put silicone bordered dressings under wraps. Use non-bordered dressing only. WOUND #3: - Foot Wound Laterality: Right, Lateral Cleanser: Soap and Water 3 x Per Week/30 Days Discharge Instructions: Gently cleanse wound with antibacterial soap, rinse and pat dry prior to dressing wounds Prim Dressing: IODOFLEX 0.9% Cadexomer Iodine Pad (DME) (Generic) 3 x Per Week/30 Days ary Discharge Instructions: Apply Iodoflex to wound bed only as directed. Secondary Dressing: (BORDER) Zetuvit Plus SILICONE BORDER Dressing 4x4 (in/in) (DME) (Generic) 3 x Per Week/30 Days Discharge Instructions: Please do not put silicone bordered dressings under wraps. Use non-bordered dressing only. 1. I would recommend based on what we are seeing that we have the patient go ahead and go forward with an arterial study with ABI and TBI I think this is good to be the primary goal and concern at this point. 2. I am good recommend as well for the time being that we use Iodoflex for the wound locations try to help clean these up and see if we can get it moving in the right direction. I also think this will help prevent infection. 3. With regard to elevation he does have some swelling as well would recommend he elevate is much as possible. 4. He is on blood thinners both Eliquis and Plavix although no longer on aspirin again this will make him bleed a lot but does not necessarily indicate good arterial perfusion which I discussed with him as well. We will see patient back for reevaluation in 1 week here in the clinic. If anything worsens or changes patient will contact our office for additional recommendations. Electronic  Signature(s) Signed: 02/02/2023 4:32:36 PM By: Allen Derry PA-C Entered By: Allen Derry on 02/02/2023 13:32:36 [name], [name] ([id number]) [id number] Page 10 of 12 -------------------------------------------------------------------------------- ROS/PFSH Details Patient Name: Date of Service: [name] [facility] [name]. 01/31/2023 2:00 PM Medical Record Number: [id number] Patient Account Number: [id number] Date of Birth/Sex: Treating RN: 11/22/1950 (72 y.o. [name] Primary Care Provider: Kerby Nora Other Clinician: Referring Provider: Treating Provider/Extender: [name], [name] in Treatment: 0 Information Obtained From Patient Constitutional Symptoms (General Health) Complaints and Symptoms: Negative for: Fatigue; Fever; Chills; Marked Weight Change Ear/Nose/Mouth/Throat Complaints and Symptoms: Negative for: Difficult clearing ears; Sinusitis Hematologic/Lymphatic Complaints and Symptoms: Negative for: Bleeding / Clotting Disorders; Human Immunodeficiency Virus Respiratory Complaints and Symptoms: Negative for: Chronic or frequent coughs; Shortness of Breath Gastrointestinal Complaints and Symptoms: Negative for: Frequent diarrhea; Nausea; Vomiting Genitourinary Complaints and Symptoms: Negative for: Kidney failure/ Dialysis; Incontinence/dribbling Immunological Complaints and Symptoms: Negative for: Hives; Itching Integumentary (Skin) Complaints and Symptoms: Negative for: Wounds; Bleeding or bruising tendency; Breakdown; Swelling Musculoskeletal Complaints and Symptoms: Negative for: Muscle Pain; Muscle Weakness Psychiatric Complaints and Symptoms: Negative for: Anxiety; Claustrophobia Eyes Medical History: Past Medical History NotesOAKLYN, [name] ([id number]) [id number] Page 11 of 12 Retinopathy Cardiovascular Medical History: Positive for: Congestive Heart Failure; Coronary Artery Disease;  Hypertension Endocrine Medical History: Positive for: Type II Diabetes Treated with: Insulin, Oral agents Blood sugar tested every day: Yes Tested : Blood sugar testing results: Lunch: 92 Neurologic Medical History: Positive for: Neuropathy Oncologic Immunizations Pneumococcal Vaccine: Received Pneumococcal Vaccination: Yes Received Pneumococcal Vaccination On or After 60th Birthday: Yes Implantable Devices None Family and Social History Never smoker; Marital Status - Widowed; Alcohol Use: Never; Drug Use: No History; Caffeine Use: Moderate Electronic Signature(s) Signed: 01/31/2023 4:45:25 PM By: Midge Aver MSN  RN CNS WTA Signed: 01/31/2023 5:37:24 PM By: Allen Derry PA-C Entered By: Midge Aver on 01/31/2023 11:29:06 -------------------------------------------------------------------------------- SuperBill Details Patient Name: Date of Service: [name] [name] 01/31/2023 Medical Record Number: [id number] Patient Account Number: [id number] Date of Birth/Sex: Treating RN: [birth year] (72 y.o. [name] Primary Care Provider: Kerby Nora Other Clinician: Referring Provider: Treating Provider/Extender: [name], [name] in Treatment: 0 Diagnosis Coding ICD-10 Codes Code Description E11.621 Type 2 diabetes mellitus with foot ulcer L97.512 Non-pressure chronic ulcer of other part of right foot with fat layer exposed I48.0 Paroxysmal atrial fibrillation Z79.01 Long term (current) use of anticoagulants I10 Essential (primary) hypertension [name], [name] ([id number]) [id number] Page 12 of 12 I25.10 Atherosclerotic heart disease of native coronary artery without angina pectoris Facility Procedures : CPT4 Code: [id number] Description: [phone] - WOUND CARE VISIT-LEV 5 EST PT Modifier: Quantity: 1 Physician Procedures : CPT4 Code Description Modifier [id number] WC PHYS LEVEL 3 NEW PT ICD-10 Diagnosis Description E11.621 Type 2 diabetes  mellitus with foot ulcer L97.512 Non-pressure chronic ulcer of other part of right foot with fat layer exposed I48.0 Paroxysmal atrial  fibrillation Z79.01 Long term (current) use of anticoagulants Quantity: 1 Electronic Signature(s) Signed: 01/31/2023 5:25:08 PM By: Allen Derry PA-C Previous Signature: 01/31/2023 4:45:25 PM Version By: Midge Aver MSN RN CNS WTA Entered By: Allen Derry on 01/31/2023 14:25:07

## 2023-05-02 LAB — VAS US ABI WITH/WO TBI: Left ABI: 0.8

## 2023-05-03 ENCOUNTER — Telehealth (HOSPITAL_COMMUNITY): Payer: Self-pay

## 2023-05-03 ENCOUNTER — Ambulatory Visit: Payer: PPO | Admitting: Internal Medicine

## 2023-05-03 NOTE — Telephone Encounter (Signed)
Medication Samples have been provided to the patient.  Drug name: Eliquis       Strength: 5 mg        Qty: 4  LOT: [id number]  Exp.Date: 05/2024  Dosing instructions: Take 1 tablet Twice daily   The patient has been instructed regarding the correct time, dose, and frequency of taking this medication, including desired effects and most common side effects.   [name] [name] 1:29 PM 05/03/2023

## 2023-05-04 ENCOUNTER — Other Ambulatory Visit (HOSPITAL_COMMUNITY): Payer: Self-pay

## 2023-05-04 NOTE — Progress Notes (Signed)
Took pt month supply of eliquis.    Will f/u next week.   Kerry Hough, EMT-Paramedic  [phone] 05/04/2023

## 2023-05-08 ENCOUNTER — Encounter: Payer: PPO | Attending: Internal Medicine | Admitting: Physician Assistant

## 2023-05-08 DIAGNOSIS — L97512 Non-pressure chronic ulcer of other part of right foot with fat layer exposed: Secondary | ICD-10-CM | POA: Insufficient documentation

## 2023-05-08 DIAGNOSIS — I480 Paroxysmal atrial fibrillation: Secondary | ICD-10-CM | POA: Diagnosis not present

## 2023-05-08 DIAGNOSIS — I110 Hypertensive heart disease with heart failure: Secondary | ICD-10-CM | POA: Diagnosis not present

## 2023-05-08 DIAGNOSIS — E1151 Type 2 diabetes mellitus with diabetic peripheral angiopathy without gangrene: Secondary | ICD-10-CM | POA: Diagnosis not present

## 2023-05-08 DIAGNOSIS — E1140 Type 2 diabetes mellitus with diabetic neuropathy, unspecified: Secondary | ICD-10-CM | POA: Insufficient documentation

## 2023-05-08 DIAGNOSIS — E11621 Type 2 diabetes mellitus with foot ulcer: Secondary | ICD-10-CM | POA: Insufficient documentation

## 2023-05-08 DIAGNOSIS — L97412 Non-pressure chronic ulcer of right heel and midfoot with fat layer exposed: Secondary | ICD-10-CM | POA: Diagnosis not present

## 2023-05-08 DIAGNOSIS — Z7901 Long term (current) use of anticoagulants: Secondary | ICD-10-CM | POA: Diagnosis not present

## 2023-05-08 DIAGNOSIS — I509 Heart failure, unspecified: Secondary | ICD-10-CM | POA: Diagnosis not present

## 2023-05-08 NOTE — Progress Notes (Signed)
CALYN, ARENDALL ([id number]) [id number] Page 1 of 7 Visit Report for 05/08/2023 Chief Complaint Document Details Patient Name: Date of Service: [name] [facility] H. 05/08/2023 1:30 PM Medical Record Number: [id number] Patient Account Number: [id number] Date of Birth/Sex: Treating RN: 1951/06/16 (72 y.o. [name]) Yevonne Pax Primary Care Provider: Kerby Nora Other Clinician: Referring Provider: Treating Provider/Extender: [name], [name] in Treatment: 13 Information Obtained from: Patient Chief Complaint Right foot ulcers Electronic Signature(s) Signed: 05/08/2023 1:45:24 PM By: Allen Derry PA-C Entered By: Allen Derry on 05/08/2023 10:45:24 -------------------------------------------------------------------------------- HPI Details Patient Name: Date of Service: [name] [facility]. 05/08/2023 1:30 PM Medical Record Number: [id number] Patient Account Number: [id number] Date of Birth/Sex: Treating RN: November 24, 1950 (72 y.o. [name]) Yevonne Pax Primary Care Provider: Kerby Nora Other Clinician: Referring Provider: Treating Provider/Extender: [name], [name] in Treatment: 13 History of Present Illness HPI Description: 01-31-2023 upon evaluation today patient appears to be doing poorly currently in regard to wounds on his right heel, right dorsal foot, and right lateral foot. Subsequently he does have known peripheral vascular disease and again this is something that he needs to I think Checked formally. This is what the majority of the conversation today hinged around and the patient voiced understanding as far as that is concerned. Fortunately I do not see any signs of active infection locally nor systemically which is great news. Patient does have a history of diabetes mellitus type 2, atrial fibrillation for which she is on long-term anticoagulant Therapy, hypertension, and coronary artery disease. Patient's hemoglobin A1c most recently was on  11-17-2022 and was 7.2 and currently he is on Eliquis and Plavix. 02-07-2023 upon evaluation today patient appears to be doing well currently in regard to his wounds all things considered I feel like we are still maintaining that does not seem like it is any worse is also not significantly better. I discussed with the patient that I do believe he would benefit from the arterial evaluation we still need to get this done as quickly as possible and subsequently we did put in a follow-up call with the vascular office today with regard to this. 02-14-2023 upon evaluation today patient appears to be doing a little better in regard to his wounds which are showing signs of loosening which is great news. With that being said he is experiencing an improvement overall in his symptoms and we are still waiting on the vascular evaluation want to get this result and consider whether or not his blood flow is good or not we will be able to make a better determination of next steps. For now we will try and avoid any aggressive sharp debridement to know that he has good arterial flow. [name], [name] ([id number]) [id number] Page 2 of 7 02-21-2023 upon evaluation today patient appears to be doing about the same in regard to his wound. Fortunately there does not appear to be any signs of active infection at this time which is great news. No fevers, chills, nausea, vomiting, or diarrhea. I did review patient's arterial study and it appears that he has pretty good flow in the left is not perfect but it is decent. On the right however he is definitely not doing nearly as good and I think that he is going to need to see one of the vascular doctors for further evaluation and treatment of this right leg in order to get these wounds to heal. 02-28-2023 upon evaluation today patient appears to be doing well currently  in regard to his wound. He has been tolerating the dressing changes without complication.  Fortunately I do not see any evidence of active infection locally nor systemically at this time. I do think that the eschar started to soften up and I would like to try to get some of this off to see if we can get things moving in the right direction. He is in agreement with this plan. 03-09-2023 upon evaluation today patient appears to be doing well currently in regard to his wound. He has been tolerating the dressing changes without complication. Fortunately there does not appear to be any signs of active infection locally nor systemically at this time which is great news. I do believe clearing out some of the necrotic debris last week has helped to a degree. 03-16-2023 upon evaluation today patient appears to be doing well currently in regard to his wounds. He is going to be having a vascular angiogram in order to open up blood flow and I think this is going to be very beneficial for him. Fortunately I do not see any evidence of active infection locally nor systemically which is great news. 7/25; patient with predominantly ischemic wounds to on his dorsal foot and 1 on the right heel. He is going for his angiogram on Monday. He has been using the Iodoflex to the wound and changing this dressing himself. 04-11-2023 upon evaluation today patient appears to be doing well currently in regard to his wounds. In fact he had the arterial procedure with vascular and the great news is he actually has much improved blood flow he had a 95% blockage of the popliteal as well as the posterior tibial based on the record review. Post procedure he now has a residual of only about 30% in the popliteal and closer around 10% in the posterior tibial. The blood flow is greatly improved and it is obvious based on what we are seeing. 04-20-2023 upon evaluation today patient appears to be doing well currently in regard to his wounds. They are actually showing signs of excellent improvement and to be honest now that he is got  good arterial flow going he is really healing quite nicely. Fortunately I do not see any signs of active infection locally or systemically which is great news. 8/29 this is a patient who has known arterial insufficiency. She has 2 wounds on the dorsal right foot and 1 on the back of the heel. She had an angiogram at the end of July and had follow-up arterial studies yesterday but I cannot pull up the arterial studies. We are using Prisma and Zituvimet and really doing quite well. The wounds are better 05-08-2023 upon evaluation today patient appears to be doing well currently in regard to his ulcers. He has been tolerating the dressing changes without complication. Fortunately there does not appear to be any signs of active infection looking or systemically at this time. Electronic Signature(s) Signed: 05/08/2023 2:29:56 PM By: Allen Derry PA-C Entered By: Allen Derry on 05/08/2023 11:29:55 -------------------------------------------------------------------------------- Physical Exam Details Patient Name: Date of Service: [name] [facility] H. 05/08/2023 1:30 PM Medical Record Number: [id number] Patient Account Number: [id number] Date of Birth/Sex: Treating RN: 1950/09/21 (72 y.o. [name]) Yevonne Pax Primary Care Provider: Kerby Nora Other Clinician: Referring Provider: Treating Provider/Extender: [name], [name] in Treatment: 13 Constitutional Well-nourished and well-hydrated in no acute distress. Respiratory normal breathing without difficulty. Psychiatric this patient is able to make decisions and demonstrates good insight into disease process. Alert  and Oriented x 3. pleasant and cooperative. Notes Upon inspection patient's wound bed actually showed signs of good granulation epithelization at this point. Fortunately I do not see any overall worsening which is great news and in general I do believe that we are making headway towards complete closure. Electronic  Signature(s) Signed: 05/08/2023 2:30:10 PM By: Darlyn Chamber, [place] ([id number]) [id number] Page 3 of 7 Entered By: Allen Derry on 05/08/2023 11:30:10 -------------------------------------------------------------------------------- Physician Orders Details Patient Name: Date of Service: [name] [facility] H. 05/08/2023 1:30 PM Medical Record Number: [id number] Patient Account Number: [id number] Date of Birth/Sex: Treating RN: 1951/01/31 (72 y.o. [name]) Yevonne Pax Primary Care Provider: Kerby Nora Other Clinician: Referring Provider: Treating Provider/Extender: [name], [name] in Treatment: 15 Verbal / Phone Orders: No Diagnosis Coding ICD-10 Coding Code Description E11.621 Type 2 diabetes mellitus with foot ulcer L97.512 Non-pressure chronic ulcer of other part of right foot with fat layer exposed I25.10 Atherosclerotic heart disease of native coronary artery without angina pectoris I48.0 Paroxysmal atrial fibrillation Z79.01 Long term (current) use of anticoagulants I10 Essential (primary) hypertension Follow-up Appointments Return Appointment in 1 week. Bathing/ [employer] wounds with antibacterial soap and water. Other: - AandD ointment to healed wounds on top of foot and calcan with coverlet for protection times 1 week Anesthetic (Use 'Patient Medications' Section for Anesthetic Order Entry) Lidocaine applied to wound bed Wound Treatment Wound #3 - Foot Wound Laterality: Dorsal, Right, Proximal Cleanser: Soap and Water Every Other Day/30 Days Discharge Instructions: Gently cleanse wound with antibacterial soap, rinse and pat dry prior to dressing wounds Prim Dressing: Prisma 4.34 (in) Every Other Day/30 Days ary Discharge Instructions: Moisten w/normal saline or sterile water; Cover wound as directed. Do not remove from wound bed. Secondary Dressing: (BORDER) Zetuvit Plus SILICONE BORDER Dressing 4x4 (in/in) (Generic)  Every Other Day/30 Days Discharge Instructions: Please do not put silicone bordered dressings under wraps. Use non-bordered dressing only. Electronic Signature(s) Signed: 05/08/2023 2:24:51 PM By: Yevonne Pax RN Signed: 05/11/2023 4:31:22 PM By: Allen Derry PA-C Entered By: Yevonne Pax on 05/08/2023 11:24:51 [name] ([id number]) [id number] Page 4 of 7 -------------------------------------------------------------------------------- Problem List Details Patient Name: Date of Service: [name] [facility] H. 05/08/2023 1:30 PM Medical Record Number: [id number] Patient Account Number: [id number] Date of Birth/Sex: Treating RN: [birth year] (72 y.o. [name]) Yevonne Pax Primary Care Provider: Kerby Nora Other Clinician: Referring Provider: Treating Provider/Extender: [name], [name] in Treatment: 89 Active Problems ICD-10 Encounter Code Description Active Date MDM Diagnosis E11.621 Type 2 diabetes mellitus with foot ulcer 01/31/2023 No Yes L97.512 Non-pressure chronic ulcer of other part of right foot with fat layer exposed 01/31/2023 No Yes I25.10 Atherosclerotic heart disease of native coronary artery without angina pectoris 01/31/2023 No Yes I48.0 Paroxysmal atrial fibrillation 01/31/2023 No Yes Z79.01 Long term (current) use of anticoagulants 01/31/2023 No Yes I10 Essential (primary) hypertension 01/31/2023 No Yes Inactive Problems Resolved Problems Electronic Signature(s) Signed: 05/08/2023 1:45:21 PM By: Allen Derry PA-C Entered By: Allen Derry on 05/08/2023 10:45:21 Progress Note Details -------------------------------------------------------------------------------- [name] ([id number]) [id number] Page 5 of 7 Patient Name: Date of Service: [name] [facility] H. 05/08/2023 1:30 PM Medical Record Number: [id number] Patient Account Number: [id number] Date of Birth/Sex: Treating RN: Apr 16, 1951 (72 y.o. [name]) Yevonne Pax Primary  Care Provider: Kerby Nora Other Clinician: Referring Provider: Treating Provider/Extender: [name], [name] in Treatment: 13 Subjective Chief Complaint Information obtained from Patient Right foot ulcers History of Present Illness (  HPI) 01-31-2023 upon evaluation today patient appears to be doing poorly currently in regard to wounds on his right heel, right dorsal foot, and right lateral foot. Subsequently he does have known peripheral vascular disease and again this is something that he needs to I think Checked formally. This is what the majority of the conversation today hinged around and the patient voiced understanding as far as that is concerned. Fortunately I do not see any signs of active infection locally nor systemically which is great news. Patient does have a history of diabetes mellitus type 2, atrial fibrillation for which she is on long-term anticoagulant Therapy, hypertension, and coronary artery disease. Patient's hemoglobin A1c most recently was on 11-17-2022 and was 7.2 and currently he is on Eliquis and Plavix. 02-07-2023 upon evaluation today patient appears to be doing well currently in regard to his wounds all things considered I feel like we are still maintaining that does not seem like it is any worse is also not significantly better. I discussed with the patient that I do believe he would benefit from the arterial evaluation we still need to get this done as quickly as possible and subsequently we did put in a follow-up call with the vascular office today with regard to this. 02-14-2023 upon evaluation today patient appears to be doing a little better in regard to his wounds which are showing signs of loosening which is great news. With that being said he is experiencing an improvement overall in his symptoms and we are still waiting on the vascular evaluation want to get this result and consider whether or not his blood flow is good or not we will be able to  make a better determination of next steps. For now we will try and avoid any aggressive sharp debridement to know that he has good arterial flow. 02-21-2023 upon evaluation today patient appears to be doing about the same in regard to his wound. Fortunately there does not appear to be any signs of active infection at this time which is great news. No fevers, chills, nausea, vomiting, or diarrhea. I did review patient's arterial study and it appears that he has pretty good flow in the left is not perfect but it is decent. On the right however he is definitely not doing nearly as good and I think that he is going to need to see one of the vascular doctors for further evaluation and treatment of this right leg in order to get these wounds to heal. 02-28-2023 upon evaluation today patient appears to be doing well currently in regard to his wound. He has been tolerating the dressing changes without complication. Fortunately I do not see any evidence of active infection locally nor systemically at this time. I do think that the eschar started to soften up and I would like to try to get some of this off to see if we can get things moving in the right direction. He is in agreement with this plan. 03-09-2023 upon evaluation today patient appears to be doing well currently in regard to his wound. He has been tolerating the dressing changes without complication. Fortunately there does not appear to be any signs of active infection locally nor systemically at this time which is great news. I do believe clearing out some of the necrotic debris last week has helped to a degree. 03-16-2023 upon evaluation today patient appears to be doing well currently in regard to his wounds. He is going to be having a vascular angiogram in order  to open up blood flow and I think this is going to be very beneficial for him. Fortunately I do not see any evidence of active infection locally nor systemically which is great news. 7/25;  patient with predominantly ischemic wounds to on his dorsal foot and 1 on the right heel. He is going for his angiogram on Monday. He has been using the Iodoflex to the wound and changing this dressing himself. 04-11-2023 upon evaluation today patient appears to be doing well currently in regard to his wounds. In fact he had the arterial procedure with vascular and the great news is he actually has much improved blood flow he had a 95% blockage of the popliteal as well as the posterior tibial based on the record review. Post procedure he now has a residual of only about 30% in the popliteal and closer around 10% in the posterior tibial. The blood flow is greatly improved and it is obvious based on what we are seeing. 04-20-2023 upon evaluation today patient appears to be doing well currently in regard to his wounds. They are actually showing signs of excellent improvement and to be honest now that he is got good arterial flow going he is really healing quite nicely. Fortunately I do not see any signs of active infection locally or systemically which is great news. 8/29 this is a patient who has known arterial insufficiency. She has 2 wounds on the dorsal right foot and 1 on the back of the heel. She had an angiogram at the end of July and had follow-up arterial studies yesterday but I cannot pull up the arterial studies. We are using Prisma and Zituvimet and really doing quite well. The wounds are better 05-08-2023 upon evaluation today patient appears to be doing well currently in regard to his ulcers. He has been tolerating the dressing changes without complication. Fortunately there does not appear to be any signs of active infection looking or systemically at this time. Objective Constitutional Well-nourished and well-hydrated in no acute distress. Vitals Time Taken: 1:48 PM, Height: 66 in, Weight: 250 lbs, BMI: 40.3, Temperature: 97.6 F, Pulse: 82 bpm, Respiratory Rate: 18 breaths/min, Blood  Pressure: 137/65 mmHg. Respiratory [name], [name] ([id number]) [id number] Page 6 of 7 normal breathing without difficulty. Psychiatric this patient is able to make decisions and demonstrates good insight into disease process. Alert and Oriented x 3. pleasant and cooperative. General Notes: Upon inspection patient's wound bed actually showed signs of good granulation epithelization at this point. Fortunately I do not see any overall worsening which is great news and in general I do believe that we are making headway towards complete closure. Integumentary (Hair, Skin) Wound #1 status is Open. Original cause of wound was Gradually Appeared. The date acquired was: 12/10/2022. The wound has been in treatment 13 weeks. The wound is located on the Right Calcaneus. The wound measures 0cm length x 0cm width x 0cm depth; 0cm^2 area and 0cm^3 volume. There is no tunneling or undermining noted. There is a none present amount of drainage noted. There is no granulation within the wound bed. There is no necrotic tissue within the wound bed. Wound #2 status is Open. Original cause of wound was Gradually Appeared. The date acquired was: 12/10/2022. The wound has been in treatment 13 weeks. The wound is located on the Right,Distal,Dorsal Foot. The wound measures 0cm length x 0cm width x 0cm depth; 0cm^2 area and 0cm^3 volume. There is no tunneling or undermining noted. There is a none present amount  of drainage noted. There is no granulation within the wound bed. There is no necrotic tissue within the wound bed. Wound #3 status is Open. Original cause of wound was Gradually Appeared. The date acquired was: 12/10/2022. The wound has been in treatment 13 weeks. The wound is located on the Right,Proximal,Dorsal Foot. The wound measures 0.6cm length x 0.5cm width x 0.1cm depth; 0.236cm^2 area and 0.024cm^3 volume. There is Fat Layer (Subcutaneous Tissue) exposed. There is no tunneling or  undermining noted. There is a medium amount of serosanguineous drainage noted. There is medium (34-66%) red granulation within the wound bed. There is a medium (34-66%) amount of necrotic tissue within the wound bed including Adherent Slough. 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 Bathing/ Shower/ Hygiene: Wash wounds with antibacterial soap and water. Other: - AandD ointment to healed wounds on top of foot and calcan with coverlet for protection times 1 week Anesthetic (Use 'Patient Medications' Section for Anesthetic Order Entry): Lidocaine applied to wound bed WOUND #3: - Foot Wound Laterality: Dorsal, Right, Proximal Cleanser: Soap and Water Every Other Day/30 Days Discharge Instructions: Gently cleanse wound with antibacterial soap, rinse and pat dry prior to dressing wounds Prim Dressing: Prisma 4.34 (in) Every Other Day/30 Days ary Discharge Instructions: Moisten w/normal saline or sterile water; Cover wound as directed. Do not remove from wound bed. Secondary Dressing: (BORDER) Zetuvit Plus SILICONE BORDER Dressing 4x4 (in/in) (Generic) Every Other Day/30 Days Discharge Instructions: Please do not put silicone bordered dressings under wraps. Use non-bordered dressing only. 1. I am going to recommend that we have the patient continue to monitor for any evidence of overall worsening in general. I do believe that we are using Prisma and this is doing a really good job. 2. I do recommend that we have the patient continue to utilize the Prisma to help with additional growth of the tissue. 3. I am also going to recommend the  patient should continue to change this 3 times per week he is doing a great job. Over the healed area so he is AandD ointment. We will see patient back for reevaluation in 1 week here in the clinic. If anything worsens or changes patient will contact our office for additional recommendations. Electronic Signature(s) Signed: 05/08/2023 2:30:48 PM By: Allen Derry PA-C Entered By: Allen Derry on 05/08/2023 11:30:47 [name] ([id number]) [id number] Page 7 of 7 -------------------------------------------------------------------------------- SuperBill Details Patient Name: Date of Service: [name] 05/08/2023 Medical Record Number: [id number] Patient Account Number: [id number] Date of Birth/Sex: Treating RN: 12/07/1950 (72 y.o. [name]) Yevonne Pax Primary Care Provider: Kerby Nora Other Clinician: Referring Provider: Treating Provider/Extender: [name], [name] in Treatment: 13 Diagnosis Coding ICD-10 Codes Code Description E11.621 Type 2 diabetes mellitus with foot ulcer L97.512 Non-pressure chronic ulcer of other part of right foot with fat layer exposed I25.10 Atherosclerotic heart disease of native coronary artery without angina pectoris I48.0 Paroxysmal atrial fibrillation Z79.01 Long term (current) use of anticoagulants I10 Essential (primary) hypertension Facility Procedures : CPT4 Code: [id number] Description: 99213 - WOUND CARE VISIT-LEV 3 EST PT Modifier: Quantity: 1 Physician Procedures : CPT4 Code Description Modifier [id number] 99213 - WC PHYS LEVEL 3 - EST PT ICD-10 Diagnosis Description E11.621  Type 2 diabetes mellitus with foot ulcer L97.512 Non-pressure chronic ulcer of other part of right foot with fat layer exposed I25.10  Atherosclerotic heart disease of native coronary artery without angina pectoris I48.0 Paroxysmal atrial fibrillation Quantity: 1 Electronic Signature(s) Signed: 05/08/2023 2:34:06 PM By: Allen Derry  PA-C Previous Signature: 05/08/2023 2:25:20 PM Version By: Yevonne Pax RN Entered By: Allen Derry on 05/08/2023 11:34:06

## 2023-05-08 NOTE — Progress Notes (Addendum)
1:30 PM Medical Record Number: [id number] Patient Account Number: [id number] Date of Birth/Sex: Treating RN: 1951-03-07 (72 y.o. [name]) Yevonne Pax Primary Care Jarmarcus Wambold: Kerby Nora Other Clinician: Referring Jaydynn Wolford: Treating Victoriah Wilds/Extender: [name], [name] in Treatment: 13 Wound Status Wound Number: 1 Primary Diabetic Wound/Ulcer of the Lower Extremity Etiology: Wound Location: Right Calcaneus Wound Open Wounding Event: Gradually Appeared Status: Date Acquired: 12/10/2022 Comorbid Congestive Heart Failure, Coronary Artery Disease, Weeks Of Treatment: 13 History:  Hypertension, Type II Diabetes, Neuropathy Clustered Wound: Yes Photos Wound Measurements Length: (cm) [name], [name] ([id number]) Width: (cm) Depth: (cm) Area: (cm) Volume: (cm) 0 % Reduction in Area: 100% [id number] Page 8 of 11 0 % Reduction in Volume: 100% 0 Epithelialization: Large (67-100%) 0 Tunneling: No 0 Undermining: No Wound Description Classification: Grade 1 Exudate Amount: None Present Foul Odor After Cleansing: No Slough/Fibrino No Wound Bed Granulation Amount: None Present (0%) Exposed Structure Necrotic Amount: None Present (0%) Fascia Exposed: No Fat Layer (Subcutaneous Tissue) Exposed: No Tendon Exposed: No Muscle Exposed: No Joint Exposed: No Bone Exposed: No Electronic Signature(s) Signed: 05/12/2023 1:15:19 PM By: Yevonne Pax RN Entered By: Yevonne Pax on 05/08/2023 13:53:10 -------------------------------------------------------------------------------- Wound Assessment Details Patient Name: Date of Service: [name] [name] 05/08/2023 1:30 PM Medical Record Number: [id number] Patient Account Number: [id number] Date of Birth/Sex: Treating RN: Aug 15, 1951 (72 y.o. [name]) Yevonne Pax Primary Care Kaede Clendenen: Kerby Nora Other Clinician: Referring Vertie Dibbern: Treating Falon Huesca/Extender: [name], [name] in Treatment: 13 Wound Status Wound Number: 2 Primary Diabetic Wound/Ulcer of the Lower Extremity Etiology: Wound Location: Right, Distal, Dorsal Foot Wound Open Wounding Event: Gradually Appeared Status: Date Acquired: 12/10/2022 Comorbid Congestive Heart Failure, Coronary Artery Disease, Weeks Of Treatment: 13 History: Hypertension, Type II Diabetes, Neuropathy Clustered Wound: No Photos Wound Measurements Length: (cm) 0 Width: (cm) 0 Depth: (cm) 0 Area: (cm) 0 Volume: (cm) 0 [name], [name] ([id number]) % Reduction in Area: 100% % Reduction in Volume: 100% Epithelialization: Large  (67-100%) Tunneling: No Undermining: No [id number] Page 9 of 11 Wound Description Classification: Grade 1 Exudate Amount: None Present Foul Odor After Cleansing: No Slough/Fibrino No Wound Bed Granulation Amount: None Present (0%) Exposed Structure Necrotic Amount: None Present (0%) Fascia Exposed: No Fat Layer (Subcutaneous Tissue) Exposed: No Tendon Exposed: No Muscle Exposed: No Joint Exposed: No Bone Exposed: No Electronic Signature(s) Signed: 05/12/2023 1:15:19 PM By: Yevonne Pax RN Entered By: Yevonne Pax on 05/08/2023 13:54:21 -------------------------------------------------------------------------------- Wound Assessment Details Patient Name: Date of Service: [name] [name] 05/08/2023 1:30 PM Medical Record Number: [id number] Patient Account Number: [id number] Date of Birth/Sex: Treating RN: 12-Aug-1951 (72 y.o. [name]) Yevonne Pax Primary Care Achillies Buehl: Kerby Nora Other Clinician: Referring Orel Cooler: Treating Bandon Sherwin/Extender: [name], [name] in Treatment: 13 Wound Status Wound Number: 3 Primary Diabetic Wound/Ulcer of the Lower Extremity Etiology: Wound Location: Right, Proximal, Dorsal Foot Wound Open Wounding Event: Gradually Appeared Status: Date Acquired: 12/10/2022 Comorbid Congestive Heart Failure, Coronary Artery Disease, Weeks Of Treatment: 13 History: Hypertension, Type II Diabetes, Neuropathy Clustered Wound: No Photos Wound Measurements Length: (cm) 0.6 Width: (cm) 0.5 Depth: (cm) 0.1 Area: (cm) 0.236 Volume: (cm) 0.024 % Reduction in Area: 90.6% % Reduction in Volume: 90.4% Epithelialization: Small (1-33%) Tunneling: No Undermining: No Wound Description Classification: Grade 1 [name], [name] ([id number]) Exudate Amount: Medium Exudate Type: Serosanguineous Exudate Color: red, brown Foul Odor After Cleansing: No [id number] Page 10 of 11 Slough/Fibrino Yes Wound  Bed Granulation Amount: Medium (34-66%) Exposed Structure Granulation Quality: Red Fascia  Darleene Cleaver, [name] in Treatment: 13 Vital Signs Height(in): 66 Pulse(bpm): 82 Weight(lbs): 250 Blood Pressure(mmHg): 137/65 [name], [name] ([id number]) [id number] Page 5 of 11 Body Mass Index(BMI): 40.3 Temperature(F): 97.6 Respiratory Rate(breaths/min): 18 [1:Photos:] Right Calcaneus Right, Distal, Dorsal Foot Right, Proximal, Dorsal Foot Wound Location: Gradually Appeared Gradually Appeared Gradually Appeared Wounding Event: Diabetic Wound/Ulcer of the Lower Diabetic Wound/Ulcer of the Lower Diabetic Wound/Ulcer of the Lower Primary Etiology: Extremity Extremity Extremity Congestive Heart Failure, Coronary Congestive Heart Failure, Coronary Congestive Heart Failure, Coronary Comorbid History: Artery Disease, Hypertension, Type II Artery Disease, Hypertension, Type II Artery Disease, Hypertension, Type II Diabetes, Neuropathy Diabetes, Neuropathy Diabetes, Neuropathy 12/10/2022 12/10/2022 12/10/2022 Date Acquired: 13 13 13  Weeks of Treatment: Open Open Open Wound Status: No No No Wound Recurrence: Yes No No Clustered Wound: 0x0x0 0x0x0 0.6x0.5x0.1 Measurements L x W x D (cm) 0 0 0.236 A (cm) : rea 0 0 0.024 Volume (cm) : 100.00% 100.00% 90.60% % Reduction in A rea: 100.00% 100.00% 90.40% % Reduction in Volume: Grade 1 Grade 1 Grade 1 Classification: None Present None Present Medium Exudate A mount: N/A N/A  Serosanguineous Exudate Type: N/A N/A red, brown Exudate Color: None Present (0%) None Present (0%) Medium (34-66%) Granulation A mount: N/A N/A Red Granulation Quality: None Present (0%) None Present (0%) Medium (34-66%) Necrotic A mount: Fascia: No Fascia: No Fat Layer (Subcutaneous Tissue): Yes Exposed Structures: Fat Layer (Subcutaneous Tissue): No Fat Layer (Subcutaneous Tissue): No Fascia: No Tendon: No Tendon: No Tendon: No Muscle: No Muscle: No Muscle: No Joint: No Joint: No Joint: No Bone: No Bone: No Bone: No Large (67-100%) Large (67-100%) Small (1-33%) Epithelialization: Treatment Notes Electronic Signature(s) Signed: 05/12/2023 1:15:19 PM By: Yevonne Pax RN Entered By: Yevonne Pax on 05/08/2023 14:16:11 -------------------------------------------------------------------------------- Multi-Disciplinary Care Plan Details Patient Name: Date of Service: [name] [name] 05/08/2023 1:30 PM Medical Record Number: [id number] Patient Account Number: [id number] Date of Birth/Sex: Treating RN: 1950/11/19 (72 y.o. [name] Primary Care Velda Wendt: Kerby Nora Other Clinician: Referring Namrata Dangler: Treating Karee Forge/Extender: [name], [name] in Treatment: [address] [name] ([id number]) [id number] Page 6 of 11 Active Inactive Electronic Signature(s) Signed: 05/12/2023 1:15:19 PM By: Yevonne Pax RN Entered By: Yevonne Pax on 05/08/2023 13:56:16 -------------------------------------------------------------------------------- Pain Assessment Details Patient Name: Date of Service: [name] [name] 05/08/2023 1:30 PM Medical Record Number: [id number] Patient Account Number: [id number] Date of Birth/Sex: Treating RN: [birth year] (72 y.o. [name]) Yevonne Pax Primary Care Kitana Gage: Kerby Nora Other Clinician: Referring Ruberta Holck: Treating Jaisean Monteforte/Extender: [name], [name] in Treatment: 13 Active  Problems Location of Pain Severity and Description of Pain Patient Has Paino No Site Locations Pain Management and Medication Current Pain Management: Electronic Signature(s) Signed: 05/12/2023 1:15:19 PM By: Yevonne Pax RN Entered By: Yevonne Pax on 05/08/2023 13:48:48 [name] ([id number]) [id number] Page 7 of 11 -------------------------------------------------------------------------------- Patient/Caregiver Education Details Patient Name: Date of Service: [name] 9/9/2024andnbsp1:30 PM Medical Record Number: [id number] Patient Account Number: [id number] Date of Birth/Gender: Treating RN: Apr 20, 1951 (72 y.o. [name]) Yevonne Pax Primary Care Physician: Kerby Nora Other Clinician: Referring Physician: Treating Physician/Extender: [name], [name] in Treatment: 13 Education Assessment Education Provided To: Patient Education Topics Provided Wound/Skin Impairment: Handouts: Caring for Your Ulcer Methods: Explain/Verbal Responses: State content correctly Electronic Signature(s) Signed: 05/12/2023 1:15:19 PM By: Yevonne Pax RN Entered By: Yevonne Pax on 05/08/2023 13:56:28 -------------------------------------------------------------------------------- Wound Assessment Details Patient Name: Date of Service: [name] [name] 05/08/2023  Darleene Cleaver, [name] in Treatment: 13 Vital Signs Height(in): 66 Pulse(bpm): 82 Weight(lbs): 250 Blood Pressure(mmHg): 137/65 [name], [name] ([id number]) [id number] Page 5 of 11 Body Mass Index(BMI): 40.3 Temperature(F): 97.6 Respiratory Rate(breaths/min): 18 [1:Photos:] Right Calcaneus Right, Distal, Dorsal Foot Right, Proximal, Dorsal Foot Wound Location: Gradually Appeared Gradually Appeared Gradually Appeared Wounding Event: Diabetic Wound/Ulcer of the Lower Diabetic Wound/Ulcer of the Lower Diabetic Wound/Ulcer of the Lower Primary Etiology: Extremity Extremity Extremity Congestive Heart Failure, Coronary Congestive Heart Failure, Coronary Congestive Heart Failure, Coronary Comorbid History: Artery Disease, Hypertension, Type II Artery Disease, Hypertension, Type II Artery Disease, Hypertension, Type II Diabetes, Neuropathy Diabetes, Neuropathy Diabetes, Neuropathy 12/10/2022 12/10/2022 12/10/2022 Date Acquired: 13 13 13  Weeks of Treatment: Open Open Open Wound Status: No No No Wound Recurrence: Yes No No Clustered Wound: 0x0x0 0x0x0 0.6x0.5x0.1 Measurements L x W x D (cm) 0 0 0.236 A (cm) : rea 0 0 0.024 Volume (cm) : 100.00% 100.00% 90.60% % Reduction in A rea: 100.00% 100.00% 90.40% % Reduction in Volume: Grade 1 Grade 1 Grade 1 Classification: None Present None Present Medium Exudate A mount: N/A N/A  Serosanguineous Exudate Type: N/A N/A red, brown Exudate Color: None Present (0%) None Present (0%) Medium (34-66%) Granulation A mount: N/A N/A Red Granulation Quality: None Present (0%) None Present (0%) Medium (34-66%) Necrotic A mount: Fascia: No Fascia: No Fat Layer (Subcutaneous Tissue): Yes Exposed Structures: Fat Layer (Subcutaneous Tissue): No Fat Layer (Subcutaneous Tissue): No Fascia: No Tendon: No Tendon: No Tendon: No Muscle: No Muscle: No Muscle: No Joint: No Joint: No Joint: No Bone: No Bone: No Bone: No Large (67-100%) Large (67-100%) Small (1-33%) Epithelialization: Treatment Notes Electronic Signature(s) Signed: 05/12/2023 1:15:19 PM By: Yevonne Pax RN Entered By: Yevonne Pax on 05/08/2023 14:16:11 -------------------------------------------------------------------------------- Multi-Disciplinary Care Plan Details Patient Name: Date of Service: [name] [name] 05/08/2023 1:30 PM Medical Record Number: [id number] Patient Account Number: [id number] Date of Birth/Sex: Treating RN: 1950/11/19 (72 y.o. [name] Primary Care Velda Wendt: Kerby Nora Other Clinician: Referring Namrata Dangler: Treating Karee Forge/Extender: [name], [name] in Treatment: [address] [name] ([id number]) [id number] Page 6 of 11 Active Inactive Electronic Signature(s) Signed: 05/12/2023 1:15:19 PM By: Yevonne Pax RN Entered By: Yevonne Pax on 05/08/2023 13:56:16 -------------------------------------------------------------------------------- Pain Assessment Details Patient Name: Date of Service: [name] [name] 05/08/2023 1:30 PM Medical Record Number: [id number] Patient Account Number: [id number] Date of Birth/Sex: Treating RN: [birth year] (72 y.o. [name]) Yevonne Pax Primary Care Kitana Gage: Kerby Nora Other Clinician: Referring Ruberta Holck: Treating Jaisean Monteforte/Extender: [name], [name] in Treatment: 13 Active  Problems Location of Pain Severity and Description of Pain Patient Has Paino No Site Locations Pain Management and Medication Current Pain Management: Electronic Signature(s) Signed: 05/12/2023 1:15:19 PM By: Yevonne Pax RN Entered By: Yevonne Pax on 05/08/2023 13:48:48 [name] ([id number]) [id number] Page 7 of 11 -------------------------------------------------------------------------------- Patient/Caregiver Education Details Patient Name: Date of Service: [name] 9/9/2024andnbsp1:30 PM Medical Record Number: [id number] Patient Account Number: [id number] Date of Birth/Gender: Treating RN: Apr 20, 1951 (72 y.o. [name]) Yevonne Pax Primary Care Physician: Kerby Nora Other Clinician: Referring Physician: Treating Physician/Extender: [name], [name] in Treatment: 13 Education Assessment Education Provided To: Patient Education Topics Provided Wound/Skin Impairment: Handouts: Caring for Your Ulcer Methods: Explain/Verbal Responses: State content correctly Electronic Signature(s) Signed: 05/12/2023 1:15:19 PM By: Yevonne Pax RN Entered By: Yevonne Pax on 05/08/2023 13:56:28 -------------------------------------------------------------------------------- Wound Assessment Details Patient Name: Date of Service: [name] [name] 05/08/2023  1:30 PM Medical Record Number: [id number] Patient Account Number: [id number] Date of Birth/Sex: Treating RN: 1951-03-07 (72 y.o. [name]) Yevonne Pax Primary Care Jarmarcus Wambold: Kerby Nora Other Clinician: Referring Jaydynn Wolford: Treating Victoriah Wilds/Extender: [name], [name] in Treatment: 13 Wound Status Wound Number: 1 Primary Diabetic Wound/Ulcer of the Lower Extremity Etiology: Wound Location: Right Calcaneus Wound Open Wounding Event: Gradually Appeared Status: Date Acquired: 12/10/2022 Comorbid Congestive Heart Failure, Coronary Artery Disease, Weeks Of Treatment: 13 History:  Hypertension, Type II Diabetes, Neuropathy Clustered Wound: Yes Photos Wound Measurements Length: (cm) [name], [name] ([id number]) Width: (cm) Depth: (cm) Area: (cm) Volume: (cm) 0 % Reduction in Area: 100% [id number] Page 8 of 11 0 % Reduction in Volume: 100% 0 Epithelialization: Large (67-100%) 0 Tunneling: No 0 Undermining: No Wound Description Classification: Grade 1 Exudate Amount: None Present Foul Odor After Cleansing: No Slough/Fibrino No Wound Bed Granulation Amount: None Present (0%) Exposed Structure Necrotic Amount: None Present (0%) Fascia Exposed: No Fat Layer (Subcutaneous Tissue) Exposed: No Tendon Exposed: No Muscle Exposed: No Joint Exposed: No Bone Exposed: No Electronic Signature(s) Signed: 05/12/2023 1:15:19 PM By: Yevonne Pax RN Entered By: Yevonne Pax on 05/08/2023 13:53:10 -------------------------------------------------------------------------------- Wound Assessment Details Patient Name: Date of Service: [name] [name] 05/08/2023 1:30 PM Medical Record Number: [id number] Patient Account Number: [id number] Date of Birth/Sex: Treating RN: Aug 15, 1951 (72 y.o. [name]) Yevonne Pax Primary Care Kaede Clendenen: Kerby Nora Other Clinician: Referring Vertie Dibbern: Treating Falon Huesca/Extender: [name], [name] in Treatment: 13 Wound Status Wound Number: 2 Primary Diabetic Wound/Ulcer of the Lower Extremity Etiology: Wound Location: Right, Distal, Dorsal Foot Wound Open Wounding Event: Gradually Appeared Status: Date Acquired: 12/10/2022 Comorbid Congestive Heart Failure, Coronary Artery Disease, Weeks Of Treatment: 13 History: Hypertension, Type II Diabetes, Neuropathy Clustered Wound: No Photos Wound Measurements Length: (cm) 0 Width: (cm) 0 Depth: (cm) 0 Area: (cm) 0 Volume: (cm) 0 [name], [name] ([id number]) % Reduction in Area: 100% % Reduction in Volume: 100% Epithelialization: Large  (67-100%) Tunneling: No Undermining: No [id number] Page 9 of 11 Wound Description Classification: Grade 1 Exudate Amount: None Present Foul Odor After Cleansing: No Slough/Fibrino No Wound Bed Granulation Amount: None Present (0%) Exposed Structure Necrotic Amount: None Present (0%) Fascia Exposed: No Fat Layer (Subcutaneous Tissue) Exposed: No Tendon Exposed: No Muscle Exposed: No Joint Exposed: No Bone Exposed: No Electronic Signature(s) Signed: 05/12/2023 1:15:19 PM By: Yevonne Pax RN Entered By: Yevonne Pax on 05/08/2023 13:54:21 -------------------------------------------------------------------------------- Wound Assessment Details Patient Name: Date of Service: [name] [name] 05/08/2023 1:30 PM Medical Record Number: [id number] Patient Account Number: [id number] Date of Birth/Sex: Treating RN: 12-Aug-1951 (72 y.o. [name]) Yevonne Pax Primary Care Achillies Buehl: Kerby Nora Other Clinician: Referring Orel Cooler: Treating Bandon Sherwin/Extender: [name], [name] in Treatment: 13 Wound Status Wound Number: 3 Primary Diabetic Wound/Ulcer of the Lower Extremity Etiology: Wound Location: Right, Proximal, Dorsal Foot Wound Open Wounding Event: Gradually Appeared Status: Date Acquired: 12/10/2022 Comorbid Congestive Heart Failure, Coronary Artery Disease, Weeks Of Treatment: 13 History: Hypertension, Type II Diabetes, Neuropathy Clustered Wound: No Photos Wound Measurements Length: (cm) 0.6 Width: (cm) 0.5 Depth: (cm) 0.1 Area: (cm) 0.236 Volume: (cm) 0.024 % Reduction in Area: 90.6% % Reduction in Volume: 90.4% Epithelialization: Small (1-33%) Tunneling: No Undermining: No Wound Description Classification: Grade 1 [name], [name] ([id number]) Exudate Amount: Medium Exudate Type: Serosanguineous Exudate Color: red, brown Foul Odor After Cleansing: No [id number] Page 10 of 11 Slough/Fibrino Yes Wound  Bed Granulation Amount: Medium (34-66%) Exposed Structure Granulation Quality: Red Fascia  Darleene Cleaver, [name] in Treatment: 13 Vital Signs Height(in): 66 Pulse(bpm): 82 Weight(lbs): 250 Blood Pressure(mmHg): 137/65 [name], [name] ([id number]) [id number] Page 5 of 11 Body Mass Index(BMI): 40.3 Temperature(F): 97.6 Respiratory Rate(breaths/min): 18 [1:Photos:] Right Calcaneus Right, Distal, Dorsal Foot Right, Proximal, Dorsal Foot Wound Location: Gradually Appeared Gradually Appeared Gradually Appeared Wounding Event: Diabetic Wound/Ulcer of the Lower Diabetic Wound/Ulcer of the Lower Diabetic Wound/Ulcer of the Lower Primary Etiology: Extremity Extremity Extremity Congestive Heart Failure, Coronary Congestive Heart Failure, Coronary Congestive Heart Failure, Coronary Comorbid History: Artery Disease, Hypertension, Type II Artery Disease, Hypertension, Type II Artery Disease, Hypertension, Type II Diabetes, Neuropathy Diabetes, Neuropathy Diabetes, Neuropathy 12/10/2022 12/10/2022 12/10/2022 Date Acquired: 13 13 13  Weeks of Treatment: Open Open Open Wound Status: No No No Wound Recurrence: Yes No No Clustered Wound: 0x0x0 0x0x0 0.6x0.5x0.1 Measurements L x W x D (cm) 0 0 0.236 A (cm) : rea 0 0 0.024 Volume (cm) : 100.00% 100.00% 90.60% % Reduction in A rea: 100.00% 100.00% 90.40% % Reduction in Volume: Grade 1 Grade 1 Grade 1 Classification: None Present None Present Medium Exudate A mount: N/A N/A  Serosanguineous Exudate Type: N/A N/A red, brown Exudate Color: None Present (0%) None Present (0%) Medium (34-66%) Granulation A mount: N/A N/A Red Granulation Quality: None Present (0%) None Present (0%) Medium (34-66%) Necrotic A mount: Fascia: No Fascia: No Fat Layer (Subcutaneous Tissue): Yes Exposed Structures: Fat Layer (Subcutaneous Tissue): No Fat Layer (Subcutaneous Tissue): No Fascia: No Tendon: No Tendon: No Tendon: No Muscle: No Muscle: No Muscle: No Joint: No Joint: No Joint: No Bone: No Bone: No Bone: No Large (67-100%) Large (67-100%) Small (1-33%) Epithelialization: Treatment Notes Electronic Signature(s) Signed: 05/12/2023 1:15:19 PM By: Yevonne Pax RN Entered By: Yevonne Pax on 05/08/2023 14:16:11 -------------------------------------------------------------------------------- Multi-Disciplinary Care Plan Details Patient Name: Date of Service: [name] [name] 05/08/2023 1:30 PM Medical Record Number: [id number] Patient Account Number: [id number] Date of Birth/Sex: Treating RN: 1950/11/19 (72 y.o. [name] Primary Care Velda Wendt: Kerby Nora Other Clinician: Referring Namrata Dangler: Treating Karee Forge/Extender: [name], [name] in Treatment: [address] [name] ([id number]) [id number] Page 6 of 11 Active Inactive Electronic Signature(s) Signed: 05/12/2023 1:15:19 PM By: Yevonne Pax RN Entered By: Yevonne Pax on 05/08/2023 13:56:16 -------------------------------------------------------------------------------- Pain Assessment Details Patient Name: Date of Service: [name] [name] 05/08/2023 1:30 PM Medical Record Number: [id number] Patient Account Number: [id number] Date of Birth/Sex: Treating RN: [birth year] (72 y.o. [name]) Yevonne Pax Primary Care Kitana Gage: Kerby Nora Other Clinician: Referring Ruberta Holck: Treating Jaisean Monteforte/Extender: [name], [name] in Treatment: 13 Active  Problems Location of Pain Severity and Description of Pain Patient Has Paino No Site Locations Pain Management and Medication Current Pain Management: Electronic Signature(s) Signed: 05/12/2023 1:15:19 PM By: Yevonne Pax RN Entered By: Yevonne Pax on 05/08/2023 13:48:48 [name] ([id number]) [id number] Page 7 of 11 -------------------------------------------------------------------------------- Patient/Caregiver Education Details Patient Name: Date of Service: [name] 9/9/2024andnbsp1:30 PM Medical Record Number: [id number] Patient Account Number: [id number] Date of Birth/Gender: Treating RN: Apr 20, 1951 (72 y.o. [name]) Yevonne Pax Primary Care Physician: Kerby Nora Other Clinician: Referring Physician: Treating Physician/Extender: [name], [name] in Treatment: 13 Education Assessment Education Provided To: Patient Education Topics Provided Wound/Skin Impairment: Handouts: Caring for Your Ulcer Methods: Explain/Verbal Responses: State content correctly Electronic Signature(s) Signed: 05/12/2023 1:15:19 PM By: Yevonne Pax RN Entered By: Yevonne Pax on 05/08/2023 13:56:28 -------------------------------------------------------------------------------- Wound Assessment Details Patient Name: Date of Service: [name] [name] 05/08/2023

## 2023-05-09 ENCOUNTER — Ambulatory Visit: Payer: PPO | Attending: Cardiology | Admitting: Cardiology

## 2023-05-09 ENCOUNTER — Other Ambulatory Visit
Admission: RE | Admit: 2023-05-09 | Discharge: 2023-05-09 | Disposition: A | Discharge status: Home / Self Care | Payer: PPO | Attending: Cardiology | Admitting: Cardiology

## 2023-05-09 ENCOUNTER — Encounter: Payer: Self-pay | Admitting: Cardiology

## 2023-05-09 VITALS — BP 80/50 | HR 78 | Ht 66.0 in | Wt 252.2 lb

## 2023-05-09 DIAGNOSIS — I4819 Other persistent atrial fibrillation: Secondary | ICD-10-CM

## 2023-05-09 DIAGNOSIS — I2510 Atherosclerotic heart disease of native coronary artery without angina pectoris: Secondary | ICD-10-CM

## 2023-05-09 DIAGNOSIS — E785 Hyperlipidemia, unspecified: Secondary | ICD-10-CM | POA: Diagnosis not present

## 2023-05-09 DIAGNOSIS — Z794 Long term (current) use of insulin: Secondary | ICD-10-CM | POA: Diagnosis not present

## 2023-05-09 DIAGNOSIS — I5020 Unspecified systolic (congestive) heart failure: Secondary | ICD-10-CM | POA: Diagnosis not present

## 2023-05-09 DIAGNOSIS — E1169 Type 2 diabetes mellitus with other specified complication: Secondary | ICD-10-CM | POA: Diagnosis not present

## 2023-05-09 DIAGNOSIS — E11319 Type 2 diabetes mellitus with unspecified diabetic retinopathy without macular edema: Secondary | ICD-10-CM | POA: Diagnosis not present

## 2023-05-09 DIAGNOSIS — I959 Hypotension, unspecified: Secondary | ICD-10-CM | POA: Diagnosis not present

## 2023-05-09 LAB — BASIC METABOLIC PANEL
Anion gap: 9 (ref 5–15)
BUN: 27 mg/dL — ABNORMAL HIGH (ref 8–23)
CO2: 29 mmol/L (ref 22–32)
Calcium: 9 mg/dL (ref 8.9–10.3)
Chloride: 99 mmol/L (ref 98–111)
Creatinine, Ser: 1.16 mg/dL (ref 0.61–1.24)
GFR, Estimated: >60 mL/min (ref >60)
Glucose, Bld: 162 mg/dL — ABNORMAL HIGH (ref 70–99)
Potassium: 4.3 mmol/L (ref 3.5–5.1)
Sodium: 137 mmol/L (ref 135–145)

## 2023-05-09 LAB — CBC
HCT: 42.4 % (ref 39.0–52.0)
Hemoglobin: 13.8 g/dL (ref 13.0–17.0)
MCH: 29.9 pg (ref 26.0–34.0)
MCHC: 32.5 g/dL (ref 30.0–36.0)
MCV: 91.8 fL (ref 80.0–100.0)
Platelets: 256 10*3/uL (ref 150–400)
RBC: 4.62 MIL/uL (ref 4.22–5.81)
RDW: 15.5 % (ref 11.5–15.5)
WBC: 9.6 10*3/uL (ref 4.0–10.5)
nRBC: 0 % (ref 0.0–0.2)

## 2023-05-09 MED ORDER — CARVEDILOL 6.25 MG PO TABS: 6.2500 mg | ORAL_TABLET | Freq: Two times a day (BID) | ORAL | 3 refills | Status: DC

## 2023-05-09 NOTE — Patient Instructions (Signed)
Medication Instructions:  Your physician has recommended you make the following change in your medication:  carvedilol (COREG) 6.25 MG tablet - Take 1 tablet (6.25 mg total) by mouth 2 (two) times daily with a meal  *If you need a refill on your cardiac medications before your next appointment, please call your pharmacy*  Lab Work: Your physician recommends that you get lab work today: CBC & Engineer, civil (consulting) at [facility] 1st desk on the right to check in (REGISTRATION)  Lab hours: Monday- Friday (7:30 am- 5:30 pm)  If you have labs (blood work) drawn today and your tests are completely normal, you will receive your results only by: MyChart Message (if you have MyChart) OR A paper copy in the mail If you have any lab test that is abnormal or we need to change your treatment, we will call you to review the results.  Testing/Procedures: -None ordered   Follow-Up: At [facility], you and your health needs are our priority.  As part of our continuing mission to provide you with exceptional heart care, we have created designated Provider Care Teams.  These Care Teams include your primary Cardiologist (physician) and Advanced Practice Providers (APPs -  Physician Assistants and Nurse Practitioners) who all work together to provide you with the care you need, when you need it.  Your next appointment:   3 week(s)  Provider:   Julien Nordmann, MD    Other Instructions -None

## 2023-05-09 NOTE — Progress Notes (Signed)
Cardiology Office Note:  .   Date:  05/09/2023  ID:  [name], DOB 05/10/1951, MRN [id number] PCP: [name], MD  [facility] Providers Cardiologist:  Julien Nordmann, MD    History of Present Illness: .   [name] is a 72 y.o. male with a past medical history of combined systolic and diastolic congestive heart failure, hypertension, coronary artery disease (PCI to LAD in the past, CTO RCA), diabetes, obesity, NSTEMI, recent COVID-19 infection, tachycardia, new onset atrial fibrillation status post cardioversion, who is here today for follow-up.  Previously hospital last and [facility] in 10/2022.  Midsternal chest discomfort.  High-sensitivity troponin peaked at 1880 and he had a BNP of 1240, respiratory panel was positive for COVID-19. Echocardiogram revealed global hypokinesis with an LVEF of 40-25%, G2 DD, mild to moderate MR.  Recommendation was NSTEMI with low likely due to to demand ischemia from COVID-19 infection he was continued on DAPT.  Would only consider cath if he develops symptoms once he was over acute illness.  He was considered stable and discharged with the facility in 11/08/2022.  Continue to follow with advanced heart failure on an outpatient basis.  He was seen in clinic 01/30/2023 and found to have new onset atrial fibrillation during his visit with advanced heart failure.  He had complaints of fatigue, shortness of breath, peripheral edema.  He was started on apixaban 5 mg twice daily for CHA2DS2-VASc score of at least 5 for stroke prophylaxis.  Was advised that he would stay on that for minimum of 3 to 4 weeks and if he remained in atrial fibrillation symptomatic would need cardioversion procedure completed.  At that time his aspirin 81 mg was discontinued.  He was last seen in clinic 02/24/2023 by Dr. Mariah Milling.  Reported that he was relatively sedentary and asymptomatic from the atrial fibrillation.  He had lower extremity edema but remained in A-fib on EKG.  His  carvedilol was increased to 12.5 mg twice daily and he was continued on apixaban and he was scheduled for cardioversion.  He underwent successful cardioversion on 03/02/2020 for management 1 time 150 J and converted back to normal sinus rhythm.  He returns to clinic today accompanied by his daughter. He has complaints today of being overly fatigued over the last few days.  He denies any chest pain, shortness of breath, palpitations, worsening peripheral edema.  States that overall he has been doing well since his cardioversion.  He is continue to follow with wound care.  Denies any bleeding with no blood noted in his urine or stool has been compliant with his apixaban.  Denies any recent hospitalizations or visits to the emergency department.  ROS: 10 point review of systems has been reviewed and considered negative with exception of what been listed in HPI  Studies Reviewed: Marland Kitchen   EKG Interpretation Date/Time:  Tuesday May 09 2023 15:43:35 EDT Ventricular Rate:  78 PR Interval:  258 QRS Duration:  84 QT Interval:  376 QTC Calculation: 428 R Axis:   -21  Text Interpretation: Sinus rhythm with 1st degree A-V block Inferior infarct , age undetermined Anterior infarct , age undetermined When compared with ECG of 03-Mar-2023 07:44, PREVIOUS ECG IS PRESENT Confirmed by Charlsie Quest ([id number]) on 05/09/2023 3:55:36 PM    TTE 11/06/22 1. Left ventricular ejection fraction, by estimation, is 40 to 45%. The  left ventricle has mildly decreased function. The left ventricle  demonstrates global hypokinesis. Left ventricular diastolic parameters are  consistent  with Grade II diastolic  dysfunction (pseudonormalization). Elevated left atrial pressure.   2. Right ventricular systolic function is normal. The right ventricular  size is normal. Tricuspid regurgitation signal is inadequate for assessing  PA pressure.   3. The mitral valve is abnormal. Mild to moderate mitral valve  regurgitation. No  evidence of mitral stenosis.   4. The aortic valve has an indeterminant number of cusps. There is mild  calcification of the aortic valve. There is mild thickening of the aortic  valve. Aortic valve regurgitation is not visualized. No aortic stenosis is  present.   5. Aortic dilatation noted. There is mild dilatation of the ascending  aorta, measuring 38 mm.    Lexiscan MPI 10/25/2017 Pharmacological myocardial perfusion imaging study with large region of fixed perfusion defect in the inferior wall with mild peri-infarct ischemia in the inferolateral region Inferior wall hypokinesis.  EF estimated at 24% No EKG changes concerning for ischemia at peak stress or in recovery. Moderate  risk scan based on low EF and large fixed defect Patient has known RCA occlusion consistent with inferior wall perfusion defect.  Consider echocardiogram to confirm EF   LHC 04/06/2016 Mid RCA to Dist RCA lesion, 100 %stenosed. Mid LAD-2 lesion, 60 %stenosed. Mid LAD-1 lesion, 20 %stenosed. The left ventricular systolic function is normal. The left ventricular ejection fraction is 50-55% by visual estimate. LV end diastolic pressure is normal. 2nd [name] lesion, 75 %stenosed.   Chronic total occlusion of the right coronary in the proximal segment well collateralized from the left circumflex and LAD. Widely patent proximal to mid LAD stent previously placed in July. There is first diagonal diagonal and LAD 30 and 50% narrowing respectively. Widely patent circumflex unchanged from previous with 70% narrowing in a small branch of the first marginal. Circumflex collateralizes the distal right coronary left ventricular branch. Inferobasal hypokinesis. EF 50%. EDP is normal. Unable to identify a significant change in the angiographic appearance since prior study in July.   RECOMMENDATIONS:   Continuation of dual antiplatelet therapy is stressed. Further management per treating team. Risk Assessment/Calculations:     CHA2DS2-VASc Score = 5   This indicates a 7.2% annual risk of stroke. [name] score is based upon: CHF History: 1 HTN History: 1 Diabetes History: 1 Stroke History: 0 Vascular Disease History: 1 Age Score: 1 Gender Score: 0            Physical Exam:   VS:  BP (!) 80/50 (BP Location: Left Arm, Patient Position: Sitting, Cuff Size: Normal)   Pulse 78   Ht 5\' 6"  (1.676 m)   Wt 252 lb 3.2 oz (114.4 kg)   SpO2 97%   BMI 40.71 kg/m    Wt Readings from Last 3 Encounters:  05/09/23 252 lb 3.2 oz (114.4 kg)  04/26/23 254 lb 6.4 oz (115.4 kg)  04/17/23 249 lb (112.9 kg)    GEN: Well nourished, well developed in no acute distress NECK: No JVD; No carotid bruits CARDIAC: RRR, no murmurs, rubs, gallops RESPIRATORY:  Clear to auscultation without rales, wheezing or rhonchi  ABDOMEN: Soft, non-tender, non-distended EXTREMITIES:  No edema; No deformity   ASSESSMENT AND PLAN: .   Hypotension with history of hypertension with a blood pressure today of 80/50 recheck of 84/52.  Unfortunately patient states that he has not had anything to eat today as he does not typically sleep until noon and has not had any oral intake as well.  He has been advised to hold his  carvedilol dosing tonight and restart his carvedilol tomorrow if his blood pressure is greater than 90 add 6.25 mg twice daily.  He has also been encouraged to eat and increase his fluid intake slightly.  As well with hypotension he has been sent for CBC to rule out anemia and a BMP to check his kidney function and electrolytes.  He has also been encouraged to monitor his pressures at home as well.  Persistent atrial fibrillation status post cardioversion who is maintaining sinus rhythm on EKG with a first-degree AV block.  Patient denies any palpitations.  He is continued on apixaban 5 mg twice daily for CHA2DS2-VASc score of at least 5 for stroke prophylaxis.  Denies any blood in his urine or stool and has been compliant with his  medications.  With a drop in pressure today carvedilol dosing has been decreased to 6.25 mg twice daily.  Coronary artery disease of the native heart with stable angina.  Echocardiogram in March 2024 revealed LVEF of 40-45%.  He has currently no symptoms of angina.  He he is continued on apixaban and lieu of aspirin clopidogrel 75 mg daily, lisinopril 10 mg daily, Imdur 30 mg twice daily.  EKG today reveals sinus rhythm with a first degree AV block without any concern for ischemic changes.  Hyperlipidemia with LDL goal less than 70.  Cholesterol previously been goal.  Continue ezetimibe 10 mg daily as he has a known statin intolerance.  Type 2 diabetes secondary with uncontrolled complications.  Hemoglobin A1c was and improving.  He is continued on his current medication regimen.  This continues to be managed by his PCP.  HFrEF with an LVEF of 40-45% on previous echocardiogram.  He is continued on spironolactone 25 mg daily, Entresto 24/26 mg twice daily of nadolol with reduced dosing today and Jardiance as well as furosemide.  Last echocardiogram was completed in March 2024.  Will be scheduled for repeat study after being on GDMT return appointment.       Dispo: Patient return to clinic to see MD/APP in 3 weeks or sooner if needed for reevaluation of blood pressure after medication changes made today  Signed, Kaya Klausing, NP

## 2023-05-10 NOTE — Progress Notes (Signed)
Kidney function stable. No dehydration noted. Blood counts stable and no elevated white count concerning for infection.

## 2023-05-15 ENCOUNTER — Ambulatory Visit: Payer: PPO | Admitting: Physician Assistant

## 2023-05-16 ENCOUNTER — Other Ambulatory Visit (HOSPITAL_COMMUNITY): Payer: Self-pay

## 2023-05-16 NOTE — Progress Notes (Unsigned)
Paramedicine Encounter    Patient ID: [name], male    DOB: [birth year], 72 y.o.   MRN: [id number]   Complaints-none   Edema-none   Compliance with meds-yes  Pill box filled-no pt does not use  If so, by whom-n/a  Refills needed-pharmacy handles refills   Pt was seen in office last week and his bp was low. His carvedilol was decreased to 6.25mg  BID. He reports he was not feeling bad at that time either.  Pt denies any complaints at this time.  No dizziness, no c/p, no sob.  No insulin this morning-he has not eaten yet.  He reports feeling ok now.  Will check back in a couple wks. He has 2 doc appointments next week.  Advised him to keep me informed on his eliquis status when he is running low.     CBG EMS-274 BP 122/64   Pulse 84   Resp 16   Wt 247 lb (112 kg)   SpO2 98%   BMI 39.87 kg/m  Weight yesterday-250 about  Last visit weight-249  Patient Care Team: [name], MD as PCP - General Mariah Milling, Tollie Pizza, MD as PCP - Cardiology (Cardiology) Antonieta Iba, MD as Consulting Physician (Cardiology) Kathyrn Sheriff, [facility] (Inactive) as Pharmacist (Pharmacist)  Patient Active Problem List   Diagnosis Date Noted   PAD (peripheral artery disease) (HCC) 04/11/2023   Leg weakness, bilateral 04/11/2023   SOB (shortness of breath) 03/03/2023   Atrial fibrillation (HCC) 02/22/2023   Alteration in mobility associated with pain 01/12/2023   Diabetic ulcer of ankle (HCC) 01/12/2023   Peripheral neuropathy 01/12/2023   NSTEMI (non-ST elevated myocardial infarction) (HCC) 11/05/2022   Penis disorder 05/16/2022   Other fatigue 05/16/2022   Rash 05/12/2022   GAD (generalized anxiety disorder) 09/21/2021   Encounter for power mobility device assessment 05/13/2021   Statin myopathy 03/30/2021   Chronic midline low back pain 11/12/2020   Tinea corporis 04/10/2018   ED (erectile dysfunction) 03/06/2018   Acute on chronic diastolic CHF (congestive heart failure)  (HCC) 09/20/2016   Adjustment disorder with mixed anxiety and depressed mood 09/09/2016   Gastroesophageal reflux disease 09/09/2016   Elevated left ventricular end-diastolic pressure (LVEDP) 03/11/2016   Anemia 03/11/2016   Hypertensive heart disease with heart failure (HCC)    Diabetes mellitus type 2 with retinopathy (HCC)    Coronary artery disease of native artery of native heart with stable angina pectoris (HCC)    Morbid obesity (HCC)    Chronic chest pain    Diabetic retinopathy (HCC) 09/23/2014   Snoring 12/21/2012   HYPOGONADISM 09/28/2010   B12 deficiency 09/28/2010   Vitamin D deficiency 09/28/2010   OTHER MALAISE AND FATIGUE 09/17/2010   TRIGGER FINGER, RIGHT MIDDLE 09/14/2010   BRANCH RETINAL VEIN OCCLUSION 11/26/2009   CONSTIPATION 08/10/2009   GASTRITIS 07/29/2009   Major depressive disorder, recurrent episode, moderate (HCC) 07/06/2007   RENAL CALCULUS, HX OF 03/26/2007   Hyperlipidemia associated with type 2 diabetes mellitus (HCC) 12/05/2006   Hypertension associated with diabetes (HCC) 12/05/2006   Osteoarthritis 12/05/2006    Current Outpatient Medications:    Acetaminophen (TYLENOL PO), Take 3-4 tablets by mouth as needed (pain)., Disp: , Rfl:    apixaban (ELIQUIS) 5 MG TABS tablet, Take 1 tablet (5 mg total) by mouth 2 (two) times daily., Disp: 56 tablet, Rfl: 0   carvedilol (COREG) 6.25 MG tablet, Take 1 tablet (6.25 mg total) by mouth 2 (two) times daily with a meal.,  Disp: 90 tablet, Rfl: 3   cholecalciferol (VITAMIN D3) 25 MCG (1000 UNIT) tablet, Take 2,000 Units by mouth daily., Disp: , Rfl:    clopidogrel (PLAVIX) 75 MG tablet, TAKE ONE TABLET BY MOUTH ONCE DAILY WITH BREAKFAST, Disp: 90 tablet, Rfl: 3   Dulaglutide (TRULICITY) 4.5 MG/0.5ML SOPN, Inject 4.5 mg into the skin once a week. Via [name] PAP, Disp: , Rfl:    empagliflozin (JARDIANCE) 10 MG TABS tablet, Take 1 tablet (10 mg total) by mouth daily., Disp: 30 tablet, Rfl: 5   ENTRESTO 24-26  MG, TAKE ONE TABLET BY MOUTH TWO (TWO) TIMES DAILY., Disp: 60 tablet, Rfl: 3   ezetimibe (ZETIA) 10 MG tablet, TAKE ONE TABLET BY MOUTH ONCE DAILY, Disp: 30 tablet, Rfl: 2   furosemide (LASIX) 40 MG tablet, Take 40 mg by mouth daily., Disp: , Rfl:    hydrOXYzine (ATARAX) 10 MG tablet, TAKE 1 TABLET BY MOUTH 3 TIMES DAILY AS NEEDED FOR ANXIETY, Disp: 30 tablet, Rfl: 2   insulin aspart (NOVOLOG FLEXPEN) 100 UNIT/ML FlexPen, Inject 13 Units into the skin every morning., Disp: , Rfl:    isosorbide mononitrate (IMDUR) 30 MG 24 hr tablet, Take 1 tablet (30 mg total) by mouth 2 (two) times daily., Disp: 60 tablet, Rfl: 2   spironolactone (ALDACTONE) 25 MG tablet, Take 1 tablet (25 mg total) by mouth daily., Disp: 30 tablet, Rfl: 5   TRESIBA FLEXTOUCH 100 UNIT/ML FlexTouch Pen, INJECT 50 UNITS INTO THE SKIN DAILY, Disp: 15 mL, Rfl: 2   TRUEPLUS 5-BEVEL PEN NEEDLES 31G X 6 MM MISC, USE TO INJECT INSULIN THREE TIMES A DAY, Disp: 300 each, Rfl: 3   venlafaxine XR (EFFEXOR-XR) 150 MG 24 hr capsule, TAKE ONE CAPSULE BY MOUTH DAILY WITH BREAKFAST. TAKE WITH EFFEXOR XR 75MG  FOR A TOTAL OF 225MG , Disp: 90 capsule, Rfl: 0   venlafaxine XR (EFFEXOR-XR) 75 MG 24 hr capsule, TAKE ONE CAPSULE BY MOUTH ONCE DAILY. TAKE IN ADDITION TO THE 150 MG CAPSULE FOR A TOTAL DOSE OF 225MG  DAILY, Disp: 90 capsule, Rfl: 0   vitamin B-12 (CYANOCOBALAMIN) 1000 MCG tablet, Take 1,000 mcg by mouth daily., Disp: , Rfl:    Continuous Blood Gluc Sensor (FREESTYLE LIBRE 2 SENSOR) MISC, APPLY SENSOR EVERY 14 DAYS TO MONITOR SUGAR CONTINOUSLY, Disp: 2 each, Rfl: 11   Continuous Glucose Receiver (FREESTYLE LIBRE 2 READER) DEVI, USE WITH SENSORS TO MONITOR SUGAR CONTINUOUSLY, Disp: 1 each, Rfl: 0   HYDROcodone-acetaminophen (NORCO) 5-325 MG tablet, Take 1-2 tablets by mouth daily as needed for moderate pain., Disp: 60 tablet, Rfl: 0   HYDROcodone-acetaminophen (NORCO/VICODIN) 5-325 MG tablet, TAKE ONE (1) TO TWO (2) TABLETS BY MOUTH DAILY AS  NEEDED FOR MODERATE PAIN. MAY, Disp: 60 tablet, Rfl: 0   HYDROcodone-acetaminophen (NORCO/VICODIN) 5-325 MG tablet, Take 1-2 tablets by mouth daily as needed for moderate pain., Disp: , Rfl:    nitroGLYCERIN (NITROSTAT) 0.4 MG SL tablet, DISSOLVE 1 TABLET UNDER TONGUE AS NEEDEDFOR CHEST PAIN. MAY REPEAT 5 MINUTES APART 3 TIMES IF NEEDED, Disp: 25 tablet, Rfl: 3 Allergies  Allergen Reactions   Bupropion Nausea Only   Ozempic (0.25 Or 0.5 Mg-Dose) [Semaglutide(0.25 Or 0.5mg -Dos)] Nausea And Vomiting   Atorvastatin Other (See Comments)    Body aches Similar effect with rosuvastatin 40 mg twice weekly      Social History   Socioeconomic History   Marital status: Widowed    Spouse name: Not on file   Number of children: 3   Years of  education: Not on file   Highest education level: 7th grade  Occupational History   Occupation: disabled    Employer: UNEMPLOYED    Comment: back injury  Tobacco Use   Smoking status: Never   Smokeless tobacco: Never  Vaping Use   Vaping status: Never Used  Substance and Sexual Activity   Alcohol use: No    Alcohol/week: 0.0 standard drinks of alcohol   Drug use: No   Sexual activity: Not Currently  Other Topics Concern   Not on file  Social History Narrative   Has a roommate, Mr. [name]. No pets.   Social Determinants of Health   Financial Resource Strain: Low Risk  (11/23/2022)   Overall Financial Resource Strain (CARDIA)    Difficulty of Paying Living Expenses: Not hard at all  Food Insecurity: No Food Insecurity (12/29/2022)   Hunger Vital Sign    Worried About Running Out of Food in the Last Year: Never true    Ran Out of Food in the Last Year: Never true  Transportation Needs: No Transportation Needs (12/29/2022)   PRAPARE - Administrator, Civil Service (Medical): No    Lack of Transportation (Non-Medical): No  Physical Activity: Inactive (11/09/2022)   Exercise Vital Sign    Days of Exercise per Week: 0 days    Minutes of  Exercise per Session: 0 min  Stress: No Stress Concern Present (11/09/2022)   [employer] of Occupational Health - Occupational Stress Questionnaire    Feeling of Stress : Only a little  Social Connections: Moderately Isolated (11/09/2022)   Social Connection and Isolation Panel [NHANES]    Frequency of Communication with Friends and Family: More than three times a week    Frequency of Social Gatherings with Friends and Family: More than three times a week    Attends Religious Services: More than 4 times per year    Active Member of [employer] or Organizations: No    Attends Banker Meetings: Never    Marital Status: Widowed  Intimate Partner Violence: Not At Risk (11/09/2022)   Humiliation, Afraid, Rape, and Kick questionnaire    Fear of Current or Ex-Partner: No    Emotionally Abused: No    Physically Abused: No    Sexually Abused: No    Physical Exam      Future Appointments  Date Time Provider Department Center  05/18/2023  8:00 AM [facility] LAB [facility]  05/22/2023  3:45 PM Allen Derry Eday III, PA-C [facility] None  05/25/2023  2:40 PM [name], MD [facility]  05/30/2023  3:20 PM Antonieta Iba, MD [facility] None  06/13/2023  3:00 PM Delma Freeze, FNP [facility] None  11/14/2023 10:45 AM [facility] ANNUAL WELLNESS VISIT 1 [facility]       Kerry Hough, Paramedic [phone] [facility] Paramedic  05/16/23

## 2023-05-18 ENCOUNTER — Other Ambulatory Visit (INDEPENDENT_AMBULATORY_CARE_PROVIDER_SITE_OTHER): Payer: PPO

## 2023-05-18 DIAGNOSIS — Z794 Long term (current) use of insulin: Secondary | ICD-10-CM

## 2023-05-18 DIAGNOSIS — M545 Low back pain, unspecified: Secondary | ICD-10-CM | POA: Diagnosis not present

## 2023-05-18 DIAGNOSIS — E11319 Type 2 diabetes mellitus with unspecified diabetic retinopathy without macular edema: Secondary | ICD-10-CM | POA: Diagnosis not present

## 2023-05-18 DIAGNOSIS — G8929 Other chronic pain: Secondary | ICD-10-CM | POA: Diagnosis not present

## 2023-05-18 LAB — COMPREHENSIVE METABOLIC PANEL
ALT: 17 U/L (ref 0–53)
AST: 15 U/L (ref 0–37)
Albumin: 3.8 g/dL (ref 3.5–5.2)
Alkaline Phosphatase: 83 U/L (ref 39–117)
BUN: 25 mg/dL — ABNORMAL HIGH (ref 6–23)
CO2: 30 mEq/L (ref 19–32)
Calcium: 9.3 mg/dL (ref 8.4–10.5)
Chloride: 102 mEq/L (ref 96–112)
Creatinine, Ser: 1.15 mg/dL (ref 0.40–1.50)
GFR: 63.13 mL/min (ref >60.00)
Glucose, Bld: 154 mg/dL — ABNORMAL HIGH (ref 70–99)
Potassium: 4.3 mEq/L (ref 3.5–5.1)
Sodium: 140 mEq/L (ref 135–145)
Total Bilirubin: 0.4 mg/dL (ref 0.2–1.2)
Total Protein: 7.1 g/dL (ref 6.0–8.3)

## 2023-05-18 LAB — LIPID PANEL
Cholesterol: 146 mg/dL (ref 0–200)
HDL: 38.6 mg/dL — ABNORMAL LOW (ref >39.00)
LDL Cholesterol: 92 mg/dL (ref 0–99)
NonHDL: 107.57
Total CHOL/HDL Ratio: 4
Triglycerides: 76 mg/dL (ref 0.0–149.0)
VLDL: 15.2 mg/dL (ref 0.0–40.0)

## 2023-05-18 LAB — HEMOGLOBIN A1C: Hgb A1c MFr Bld: 7.9 % — ABNORMAL HIGH (ref 4.6–6.5)

## 2023-05-18 LAB — MICROALBUMIN / CREATININE URINE RATIO
Creatinine,U: 81.7 mg/dL
Microalb Creat Ratio: 0.9 mg/g (ref 0.0–30.0)
Microalb, Ur: <0.7 mg/dL (ref 0.0–1.9)

## 2023-05-18 NOTE — Progress Notes (Signed)
No critical labs need to be addressed urgently. We will discuss labs in detail at upcoming office visit.   

## 2023-05-19 ENCOUNTER — Other Ambulatory Visit: Payer: Self-pay | Admitting: Family Medicine

## 2023-05-20 LAB — DRUG MONITORING, PANEL 8 WITH CONFIRMATION, URINE
6 Acetylmorphine: NEGATIVE ng/mL (ref <10)
Alcohol Metabolites: NEGATIVE ng/mL (ref <500)
Amphetamines: NEGATIVE ng/mL (ref <500)
Benzodiazepines: NEGATIVE ng/mL (ref <100)
Buprenorphine, Urine: NEGATIVE ng/mL (ref <5)
Cocaine Metabolite: NEGATIVE ng/mL (ref <150)
Codeine: NEGATIVE ng/mL (ref <50)
Creatinine: 87.7 mg/dL (ref >=20.0)
Hydrocodone: 242 ng/mL — ABNORMAL HIGH (ref <50)
Hydromorphone: NEGATIVE ng/mL (ref <50)
MDMA: NEGATIVE ng/mL (ref <500)
Marijuana Metabolite: NEGATIVE ng/mL (ref <20)
Morphine: NEGATIVE ng/mL (ref <50)
Norhydrocodone: 251 ng/mL — ABNORMAL HIGH (ref <50)
Opiates: POSITIVE ng/mL — AB (ref <100)
Oxidant: NEGATIVE ug/mL (ref <200)
Oxycodone: NEGATIVE ng/mL (ref <100)
pH: 5.5 (ref 4.5–9.0)

## 2023-05-20 LAB — DM TEMPLATE

## 2023-05-22 ENCOUNTER — Ambulatory Visit: Payer: PPO | Admitting: Physician Assistant

## 2023-05-22 ENCOUNTER — Telehealth: Payer: Self-pay

## 2023-05-22 MED ORDER — HYDROCODONE-ACETAMINOPHEN 5-325 MG PO TABS: 1.0000 | ORAL_TABLET | Freq: Every day | ORAL | 0 refills | Status: DC | PRN

## 2023-05-22 NOTE — Telephone Encounter (Signed)
Received a phone call from pharmacy over the weekend.

## 2023-05-22 NOTE — Telephone Encounter (Signed)
Call patient Let patient know that it it appeared to me as though his pain medication was being filled early.  Pharmacy states that it was not as it was filled on August 22.  I will refill the medication but in the future he needs to get pain medications when he is here at an office visit given it is a controlled substance

## 2023-05-22 NOTE — Telephone Encounter (Signed)
[name] notified as instructed by telephone.  [name] states understanding.  He does have an office visit scheduled on Thursday 05/25/2023 for pain management.

## 2023-05-22 NOTE — Addendum Note (Signed)
Addended by: Kerby Nora E on: 05/22/2023 10:23 AM   Modules accepted: Orders

## 2023-05-23 ENCOUNTER — Other Ambulatory Visit: Payer: Self-pay | Admitting: Family

## 2023-05-23 ENCOUNTER — Other Ambulatory Visit: Payer: Self-pay | Admitting: Cardiovascular Disease

## 2023-05-25 ENCOUNTER — Ambulatory Visit: Payer: PPO | Admitting: Family Medicine

## 2023-05-25 ENCOUNTER — Encounter: Payer: Self-pay | Admitting: Family Medicine

## 2023-05-25 VITALS — BP 126/66 | HR 82 | Temp 98.5°F | Ht 66.0 in | Wt 253.4 lb

## 2023-05-25 DIAGNOSIS — E1169 Type 2 diabetes mellitus with other specified complication: Secondary | ICD-10-CM

## 2023-05-25 DIAGNOSIS — E785 Hyperlipidemia, unspecified: Secondary | ICD-10-CM | POA: Diagnosis not present

## 2023-05-25 DIAGNOSIS — F411 Generalized anxiety disorder: Secondary | ICD-10-CM | POA: Diagnosis not present

## 2023-05-25 DIAGNOSIS — Z794 Long term (current) use of insulin: Secondary | ICD-10-CM

## 2023-05-25 DIAGNOSIS — Z23 Encounter for immunization: Secondary | ICD-10-CM | POA: Diagnosis not present

## 2023-05-25 DIAGNOSIS — I152 Hypertension secondary to endocrine disorders: Secondary | ICD-10-CM | POA: Diagnosis not present

## 2023-05-25 DIAGNOSIS — E1159 Type 2 diabetes mellitus with other circulatory complications: Secondary | ICD-10-CM

## 2023-05-25 DIAGNOSIS — E11319 Type 2 diabetes mellitus with unspecified diabetic retinopathy without macular edema: Secondary | ICD-10-CM

## 2023-05-25 MED ORDER — GABAPENTIN 100 MG PO CAPS: 100.0000 mg | ORAL_CAPSULE | Freq: Every day | ORAL | 3 refills | Status: DC

## 2023-05-25 NOTE — Progress Notes (Signed)
Patient ID: [name], male    DOB: 1950-09-30, 72 y.o.   MRN: [id number]  This visit was conducted in person.  BP 126/66   Pulse 82   Temp 98.5 F (36.9 C) (Oral)   Ht 5\' 6"  (1.676 m)   Wt 253 lb 6 oz (114.9 kg)   SpO2 99%   BMI 40.90 kg/m    CC:  Chief Complaint  Patient presents with   Medical Management of Chronic Issues    Here for DM and chronic pain mgmt f/u.    Subjective:   HPI: SIAH MEWS is a 72 y.o. male presenting on 05/25/2023 for Medical Management of Chronic Issues (Here for DM and chronic pain mgmt f/u.)  Reviewed recent cardiology office visit from  May 09, 2023  atrial fibrillation, HFrEF Rate controlled with carvedilol .Marland Kitchen Did not tolerate 12.5 mg twice daily given low BP.. now back on 6.25 mg BID On Plavix 75 mg daily, started on apixaban for anticoagulation. On anticoagulation.  He underwent successful cardioversion on 03/02/2020 for management 1 time 150 J and converted back to normal sinus rhythm.  Heart failure controlled with s continued on carvedilol 6.25 mg twice daily, Jardiance 10 mg daily, furosemide 40 mg daily and the second dose daily as needed for weight gain of 2 pounds or greater, Entresto 24/26 mg twice daily and spironolactone 25 mg daily.   BP Readings from Last 3 Encounters:  05/25/23 126/66  05/16/23 122/64  05/09/23 (!) 80/50      Diabetes:  recent worsening control on Jardiance and Trulicity, NovoLog and Tresiba Lab Results  Component Value Date   HGBA1C 7.9 (H) 05/18/2023  Using medications without difficulties: Hypoglycemic episodes: none Hyperglycemic episodes:   occ Feet problems: PAD Blood Sugars averaging: FBS 100 eye exam within last year: yes  Statin indicated that patient intolerant to multiple statins in the past Currently on Zetia 10 mg daily.  Cardiology looking into a PCSK9 inhibitor. Lab Results  Component Value Date   CHOL 146 05/18/2023   HDL 38.60 (L) 05/18/2023   LDLCALC 92 05/18/2023    LDLDIRECT 174.0 10/19/2017   TRIG 76.0 05/18/2023   CHOLHDL 4 05/18/2023   Indication for chronic opioid: chronic low back pain Medication and dose: oxycodone/acetaminophen 5/325 mg 1 to 2 tablets daily as needed for pain.   Worsening pain in low back.  Requiring 3 times daily. # pills per month: 60 Last UDS date: 05/12/2022 Opioid Treatment Agreement signed (Y/N): 05/25/23 Opioid Treatment Agreement last reviewed with patient:   [facility] reviewed this encounter (include red flags): Yes 05/25/23 No red flags.   MDD.Marland Kitchen states depression well controlled but more anxious, nervous. Trouble with  pain keeping up  at night.   Relevant past medical, surgical, family and social history reviewed and updated as indicated. Interim medical history since our last visit reviewed. Allergies and medications reviewed and updated. Outpatient Medications Prior to Visit  Medication Sig Dispense Refill   Acetaminophen (TYLENOL PO) Take 3-4 tablets by mouth as needed (pain).     apixaban (ELIQUIS) 5 MG TABS tablet Take 1 tablet (5 mg total) by mouth 2 (two) times daily. 56 tablet 0   carvedilol (COREG) 6.25 MG tablet Take 1 tablet (6.25 mg total) by mouth 2 (two) times daily with a meal. 90 tablet 3   cholecalciferol (VITAMIN D3) 25 MCG (1000 UNIT) tablet Take 2,000 Units by mouth daily.     clopidogrel (PLAVIX) 75 MG tablet TAKE ONE  TABLET BY MOUTH ONCE DAILY WITH BREAKFAST 90 tablet 3   Continuous Blood Gluc Sensor (FREESTYLE LIBRE 2 SENSOR) MISC APPLY SENSOR EVERY 14 DAYS TO MONITOR SUGAR CONTINOUSLY 2 each 11   Continuous Glucose Receiver (FREESTYLE LIBRE 2 READER) DEVI USE WITH SENSORS TO MONITOR SUGAR CONTINUOUSLY 1 each 0   Dulaglutide (TRULICITY) 4.5 MG/0.5ML SOPN Inject 4.5 mg into the skin once a week. Via [employer] PAP     empagliflozin (JARDIANCE) 10 MG TABS tablet Take 1 tablet (10 mg total) by mouth daily. 30 tablet 5   ENTRESTO 24-26 MG TAKE ONE TABLET BY MOUTH TWO (TWO) TIMES DAILY. 60 tablet 3    ezetimibe (ZETIA) 10 MG tablet TAKE ONE TABLET BY MOUTH ONCE DAILY 30 tablet 0   furosemide (LASIX) 40 MG tablet Take 40 mg by mouth daily.     HYDROcodone-acetaminophen (NORCO/VICODIN) 5-325 MG tablet Take 1-2 tablets by mouth daily as needed for moderate pain. 60 tablet 0   hydrOXYzine (ATARAX) 10 MG tablet TAKE 1 TABLET BY MOUTH 3 TIMES DAILY AS NEEDED FOR ANXIETY 30 tablet 2   insulin aspart (NOVOLOG FLEXPEN) 100 UNIT/ML FlexPen Inject 13 Units into the skin every morning.     isosorbide mononitrate (IMDUR) 30 MG 24 hr tablet Take 1 tablet (30 mg total) by mouth 2 (two) times daily. 60 tablet 2   nitroGLYCERIN (NITROSTAT) 0.4 MG SL tablet DISSOLVE 1 TABLET UNDER TONGUE AS NEEDEDFOR CHEST PAIN. MAY REPEAT 5 MINUTES APART 3 TIMES IF NEEDED 25 tablet 3   spironolactone (ALDACTONE) 25 MG tablet TAKE ONE TABLET (25 MG TOTAL) BY MOUTH DAILY. 30 tablet 5   TRESIBA FLEXTOUCH 100 UNIT/ML FlexTouch Pen INJECT 50 UNITS INTO THE SKIN DAILY 15 mL 2   TRUEPLUS 5-BEVEL PEN NEEDLES 31G X 6 MM MISC USE TO INJECT INSULIN THREE TIMES A DAY 300 each 3   venlafaxine XR (EFFEXOR-XR) 150 MG 24 hr capsule TAKE ONE CAPSULE BY MOUTH DAILY WITH BREAKFAST. TAKE WITH EFFEXOR XR 75MG  FOR A TOTAL OF 225MG  90 capsule 0   venlafaxine XR (EFFEXOR-XR) 75 MG 24 hr capsule TAKE ONE CAPSULE BY MOUTH ONCE DAILY. TAKE IN ADDITION TO THE 150 MG CAPSULE FOR A TOTAL DOSE OF 225MG  DAILY 90 capsule 0   vitamin B-12 (CYANOCOBALAMIN) 1000 MCG tablet Take 1,000 mcg by mouth daily.     No facility-administered medications prior to visit.     Per HPI unless specifically indicated in ROS section below Review of Systems  Constitutional:  Positive for fatigue. Negative for diaphoresis and fever.  HENT:  Negative for ear pain.   Eyes:  Negative for pain.  Respiratory:  Negative for cough and shortness of breath.   Cardiovascular:  Negative for chest pain, palpitations and leg swelling.  Gastrointestinal:  Negative for abdominal pain.   Genitourinary:  Negative for dysuria.  Musculoskeletal:  Negative for arthralgias.  Neurological:  Negative for syncope, light-headedness and headaches.  Psychiatric/Behavioral:  Negative for dysphoric mood.    Objective:  BP 126/66   Pulse 82   Temp 98.5 F (36.9 C) (Oral)   Ht 5\' 6"  (1.676 m)   Wt 253 lb 6 oz (114.9 kg)   SpO2 99%   BMI 40.90 kg/m   Wt Readings from Last 3 Encounters:  05/25/23 253 lb 6 oz (114.9 kg)  05/16/23 247 lb (112 kg)  05/09/23 252 lb 3.2 oz (114.4 kg)      Physical Exam Constitutional:      Appearance: He is well-developed.  He is obese.  HENT:     Head: Normocephalic.     Right Ear: Hearing normal.     Left Ear: Hearing normal.     Nose: Nose normal.  Neck:     Thyroid: No thyroid mass or thyromegaly.     Vascular: No carotid bruit.     Trachea: Trachea normal.  Cardiovascular:     Rate and Rhythm: Normal rate. Rhythm irregularly irregular.     Pulses: Normal pulses.     Heart sounds: Heart sounds not distant. No murmur heard.    No friction rub. No gallop.     Comments: No peripheral edema Pulmonary:     Effort: Pulmonary effort is normal. No respiratory distress.     Breath sounds: Normal breath sounds.  Musculoskeletal:     Right shoulder: Decreased range of motion.     Left shoulder: Decreased range of motion.     Cervical back: Decreased range of motion.     Thoracic back: Decreased range of motion.     Lumbar back: Tenderness present. Decreased range of motion.     Right hip: Decreased range of motion.     Left hip: Decreased range of motion.     Right knee: Bony tenderness present. Decreased range of motion. Tenderness present over the medial joint line and lateral joint line.     Left knee: Bony tenderness present. Decreased range of motion. Tenderness present over the medial joint line and lateral joint line.  Skin:    General: Skin is warm and dry.     Findings: No rash.  Neurological:     Mental Status: He is oriented to  person, place, and time. Mental status is at baseline.     Cranial Nerves: Cranial nerves 2-12 are intact.     Sensory: Sensory deficit present.     Motor: Weakness present.     Comments: Decreased sensation to light touch in feet Upper extremity strength 5 out of 5 bilaterally Hip flexor strength 4 out of 5 bilaterally Knee extension and flexion 4 out of 5 bilaterally Ankle flexion extension 4 out of 5 bilaterally  Gait wobbly and antalgic. Unable to ambulate more than 5 feet without assistance. Can only stand unassisted for 5 seconds without needing to sit given bilateral leg weakness.  Psychiatric:        Speech: Speech normal.        Behavior: Behavior normal.        Thought Content: Thought content normal.       Results for orders placed or performed in visit on 05/18/23  Microalbumin / creatinine urine ratio  Result Value Ref Range   Microalb, Ur <0.7 0.0 - 1.9 mg/dL   Creatinine,U 62.9 mg/dL   Microalb Creat Ratio 0.9 0.0 - 30.0 mg/g  Comprehensive metabolic panel  Result Value Ref Range   Sodium 140 135 - 145 mEq/L   Potassium 4.3 3.5 - 5.1 mEq/L   Chloride 102 96 - 112 mEq/L   CO2 30 19 - 32 mEq/L   Glucose, Bld 154 (H) 70 - 99 mg/dL   BUN 25 (H) 6 - 23 mg/dL   Creatinine, Ser 5.28 0.40 - 1.50 mg/dL   Total Bilirubin 0.4 0.2 - 1.2 mg/dL   Alkaline Phosphatase 83 39 - 117 U/L   AST 15 0 - 37 U/L   ALT 17 0 - 53 U/L   Total Protein 7.1 6.0 - 8.3 g/dL   Albumin 3.8 3.5 - 5.2 g/dL  GFR 63.13 >60.00 mL/min   Calcium 9.3 8.4 - 10.5 mg/dL  Lipid panel  Result Value Ref Range   Cholesterol 146 0 - 200 mg/dL   Triglycerides 91.4 0.0 - 149.0 mg/dL   HDL 78.29 (L) >56.21 mg/dL   VLDL 30.8 0.0 - 65.7 mg/dL   LDL Cholesterol 92 0 - 99 mg/dL   Total CHOL/HDL Ratio 4    NonHDL 107.57   Hemoglobin A1c  Result Value Ref Range   Hgb A1c MFr Bld 7.9 (H) 4.6 - 6.5 %  DRUG MONITORING, PANEL 8 WITH CONFIRMATION, URINE  Result Value Ref Range   Alcohol Metabolites NEGATIVE  <500 ng/mL   Amphetamines NEGATIVE <500 ng/mL   Benzodiazepines NEGATIVE <100 ng/mL   Buprenorphine, Urine NEGATIVE <5 ng/mL   Cocaine Metabolite NEGATIVE <150 ng/mL   6 Acetylmorphine NEGATIVE <10 ng/mL   Marijuana Metabolite NEGATIVE <20 ng/mL   MDMA NEGATIVE <500 ng/mL   Opiates POSITIVE (A) <100 ng/mL   Codeine NEGATIVE <50 ng/mL   Hydrocodone 242 (H) <50 ng/mL   Hydromorphone NEGATIVE <50 ng/mL   Morphine NEGATIVE <50 ng/mL   Norhydrocodone 251 (H) <50 ng/mL   Opiates Comments     Oxycodone NEGATIVE <100 ng/mL   Creatinine 87.7 > or = 20.0 mg/dL   pH 5.5 4.5 - 9.0   Oxidant NEGATIVE <200 mcg/mL  DM TEMPLATE  Result Value Ref Range   Notes and Comments      Assessment and Plan  Encounter for immunization -     Flu Vaccine Trivalent High Dose (Fluad)  Hyperlipidemia associated with type 2 diabetes mellitus (HCC) Assessment & Plan: Chronic, inadequate control with Zetia.  Would definitely suggest consideration of PCSK9 inhibitor.  If cardiologist not looking into this in the near future I will move forward with it.   Hypertension associated with diabetes (HCC) Assessment & Plan: Chronic, well-controlled in office today  Coreg 6.25 milligrams p.o. twice daily, losartan 50 mg p.o. daily  Lasix 40 mg daily Spironolactone 25 mg p.o. daily   Type 2 diabetes mellitus with retinopathy of both eyes, with long-term current use of insulin, macular edema presence unspecified, unspecified retinopathy severity (HCC) Assessment & Plan: Chronic, worsening control likely due to multiple celebrations in the last 3 months and not sticking to low carbohydrate diet.  He will get back on track with this.  I am hesitant on elevating his medication as he does continue to occasionally have lows.  Encouraged bedtime snack.  On Trulicity 4.5 mg weekly. [name] and NovoLog  Jardiance 10 mg p.o. daily   Reevaluate in 3 months.   GAD (generalized anxiety disorder) Assessment &  Plan: Chronic, he if he feels that the hydroxyzine 10 mg is not effective.  I have recommended that he if he uses this on a limited basis he can take 2 at a time as long as it is not over sedating.   Other orders -     Gabapentin; Take 1 capsule (100 mg total) by mouth at bedtime.  Dispense: 30 capsule; Refill: 3    Return in about 2 weeks (around 06/08/2023) for  follow up chronic pain.   Kerby Nora, MD

## 2023-05-25 NOTE — Assessment & Plan Note (Signed)
Chronic, inadequate control with Zetia.  Would definitely suggest consideration of PCSK9 inhibitor.  If cardiologist not looking into this in the near future I will move forward with it.

## 2023-05-25 NOTE — Assessment & Plan Note (Signed)
Chronic, he if he feels that the hydroxyzine 10 mg is not effective.  I have recommended that he if he uses this on a limited basis he can take 2 at a time as long as it is not over sedating.

## 2023-05-25 NOTE — Assessment & Plan Note (Signed)
Chronic, worsening control likely due to multiple celebrations in the last 3 months and not sticking to low carbohydrate diet.  He will get back on track with this.  I am hesitant on elevating his medication as he does continue to occasionally have lows.  Encouraged bedtime snack.  On Trulicity 4.5 mg weekly. [name] and NovoLog  Jardiance 10 mg p.o. daily   Reevaluate in 3 months.

## 2023-05-25 NOTE — Patient Instructions (Addendum)
Start gabapentin 100 mg at bedtime for pain.  Can use hydrocodone three time daily if needed for pain.  Can  use hydroxyzine 1-2 tablets for anxiety up to three times daily but not regualrly as it is over sedating.

## 2023-05-25 NOTE — Assessment & Plan Note (Signed)
Chronic, well-controlled in office today  Coreg 6.25 milligrams p.o. twice daily, losartan 50 mg p.o. daily  Lasix 40 mg daily Spironolactone 25 mg p.o. daily

## 2023-05-29 ENCOUNTER — Encounter: Payer: PPO | Admitting: Physician Assistant

## 2023-05-29 DIAGNOSIS — L97412 Non-pressure chronic ulcer of right heel and midfoot with fat layer exposed: Secondary | ICD-10-CM | POA: Diagnosis not present

## 2023-05-29 DIAGNOSIS — E11621 Type 2 diabetes mellitus with foot ulcer: Secondary | ICD-10-CM | POA: Diagnosis not present

## 2023-05-29 DIAGNOSIS — L97512 Non-pressure chronic ulcer of other part of right foot with fat layer exposed: Secondary | ICD-10-CM | POA: Diagnosis not present

## 2023-05-29 NOTE — Progress Notes (Signed)
Odor After Cleansing: No Slough/Fibrino No Wound Bed Granulation Amount: Medium (34-66%) Exposed Structure Granulation Quality: Red Fascia Exposed: No Necrotic Amount: None Present (0%) Fat Layer (Subcutaneous Tissue) Exposed: Yes Tendon Exposed:  No Muscle Exposed: No Joint Exposed: No Bone Exposed: No Treatment Notes Wound #1R (Calcaneus) Wound Laterality: Right Cleanser Soap and Water Discharge Instruction: Gently cleanse wound with antibacterial soap, rinse and pat dry prior to dressing wounds Peri-Wound Care TREYTON, SLIMP ([id number]) [id number] Page 8 of 11 Topical Primary Dressing Prisma 4.34 (in) Discharge Instruction: Moisten w/normal saline or sterile water; Cover wound as directed. Do not remove from wound bed. Secondary Dressing (BORDER) Zetuvit Plus SILICONE BORDER Dressing 4x4 (in/in) Discharge Instruction: Please do not put silicone bordered dressings under wraps. Use non-bordered dressing only. Secured With Compression Wrap Compression Stockings Facilities manager) Signed: 05/29/2023 4:57:37 PM By: Midge Aver MSN RN CNS WTA Entered By: Midge Aver on 05/29/2023 15:38:05 -------------------------------------------------------------------------------- Wound Assessment Details Patient Name: Date of Service: William Albright HN H. 05/29/2023 3:45 PM Medical Record Number: [id number] Patient Account Number: [id number] Date of Birth/Sex: Treating RN: 1951/06/10 (72 y.o. [name] Primary Care Izabel Chim: Kerby Nora Other Clinician: Referring Josalin Carneiro: Treating Sarabella Caprio/Extender: [name], [name] in Treatment: 16 Wound Status Wound Number: 2R Primary Diabetic Wound/Ulcer of the Lower Extremity Etiology: Wound Location: Right, Distal, Dorsal Foot Wound Open Wounding Event: Gradually Appeared Status: Date Acquired: 12/10/2022 Comorbid Congestive Heart Failure, Coronary Artery Disease, Weeks Of Treatment: 16 History: Hypertension, Type II Diabetes, Neuropathy Clustered Wound: No Photos Wound Measurements Length: (cm) 0.7 Width: (cm) 0.4 Depth: (cm) 0.1 Area: (cm) 0.22 Volume: (cm) 0.022 % Reduction in Area: 74.5% % Reduction in Volume:  74.4% Epithelialization: Large (67-100%) Wound Description AZION, CENTRELLA ([id number]) Classification: Grade 1 Exudate Amount: None Present [id number] Page 9 of 11 Foul Odor After Cleansing: No Slough/Fibrino No Wound Bed Granulation Amount: Medium (34-66%) Exposed Structure Granulation Quality: Red, Pink Fascia Exposed: No Necrotic Amount: None Present (0%) Fat Layer (Subcutaneous Tissue) Exposed: Yes Tendon Exposed: No Muscle Exposed: No Joint Exposed: No Bone Exposed: No Treatment Notes Wound #2R (Foot) Wound Laterality: Dorsal, Right, Distal Cleanser Soap and Water Discharge Instruction: Gently cleanse wound with antibacterial soap, rinse and pat dry prior to dressing wounds Peri-Wound Care Topical Primary Dressing Prisma 4.34 (in) Discharge Instruction: Moisten w/normal saline or sterile water; Cover wound as directed. Do not remove from wound bed. Secondary Dressing (BORDER) Zetuvit Plus SILICONE BORDER Dressing 4x4 (in/in) Discharge Instruction: Please do not put silicone bordered dressings under wraps. Use non-bordered dressing only. Secured With Compression Wrap Compression Stockings Facilities manager) Signed: 05/29/2023 4:57:37 PM By: Midge Aver MSN RN CNS WTA Entered By: Midge Aver on 05/29/2023 15:41:05 -------------------------------------------------------------------------------- Wound Assessment Details Patient Name: Date of Service: William Albright HN H. 05/29/2023 3:45 PM Medical Record Number: [id number] Patient Account Number: [id number] Date of Birth/Sex: Treating RN: 1950-12-20 (72 y.o. [name] Primary Care Jess Sulak: Kerby Nora Other Clinician: Referring Machelle Raybon: Treating Trena Dunavan/Extender: [name], [name] in Treatment: 16 Wound Status Wound Number: 3 Primary Diabetic Wound/Ulcer of the Lower Extremity Etiology: Wound Location: Right, Proximal, Dorsal Foot Wound Open Wounding  Event: Gradually Appeared Status: Date Acquired: 12/10/2022 Comorbid Congestive Heart Failure, Coronary Artery Disease, Weeks Of Treatment: 16 History: Hypertension, Type II Diabetes, Neuropathy Clustered Wound: No BRYDAN, DOWNARD ([id number]) [id number] Page 10 of 11 Photos Wound Measurements Length: (cm) 1.3 Width: (cm) 2 Depth: (cm) 0.3 Area: (cm) 2.042 Volume: (cm) 0.613 % Reduction  NIKOLOS, BILLIG ([id number]) [id number] Page 1 of 11 Visit Report for 05/29/2023 Arrival Information Details Patient Name: Date of Service: [name] 05/29/2023 3:45 PM Medical Record Number: [id number] Patient Account Number: [id number] Date of Birth/Sex: Treating RN: 03/14/1951 (72 y.o. [name] Primary Care Cole Klugh: Kerby Nora Other Clinician: Referring Jamarquis Crull: Treating Sherle Mello/Extender: [name], [name] in Treatment: 16 Visit Information History Since Last Visit Added or deleted any medications: No Patient Arrived: Ambulatory Any new allergies or adverse reactions: No Arrival Time: 15:22 Has Dressing in Place as Prescribed: Yes Accompanied By: self Pain Present Now: No Transfer Assistance: None Patient Identification Verified: Yes Secondary Verification Process Completed: Yes Patient Requires Transmission-Based Precautions: No Patient Has Alerts: Yes Patient Alerts: Patient on Blood Thinner Diabetes type 2 Eliquis/Plavix ABI R 1.23 TBI 0.57 ABI L 0.98 TBI 0.80 Electronic Signature(s) Signed: 05/29/2023 4:57:37 PM By: Midge Aver MSN RN CNS WTA Entered By: Midge Aver on 05/29/2023 15:29:59 -------------------------------------------------------------------------------- Clinic Level of Care Assessment Details Patient Name: Date of Service: William Albright HN H. 05/29/2023 3:45 PM Medical Record Number: [id number] Patient Account Number: [id number] Date of Birth/Sex: Treating RN: 1950/09/06 (72 y.o. [name] Primary Care Christy Friede: Kerby Nora Other Clinician: Referring Charma Mocarski: Treating Jaivian Battaglini/Extender: [name], [name] in Treatment: 16 Clinic Level of Care Assessment Items TOOL 1 Quantity Score []  - 0 Use when EandM and Procedure is performed on INITIAL visit ASSESSMENTS - Nursing Assessment / Reassessment []  - 0 General Physical Exam (combine w/ comprehensive assessment (listed just  below) when performed on new pt. evals) []  - 0 Comprehensive Assessment (HX, ROS, Risk Assessments, Wounds Hx, etc.) WILLOW, RECZEK ([id number]) [id number] Page 2 of 11 ASSESSMENTS - Wound and Skin Assessment / Reassessment []  - 0 Dermatologic / Skin Assessment (not related to wound area) ASSESSMENTS - Ostomy and/or Continence Assessment and Care []  - 0 Incontinence Assessment and Management []  - 0 Ostomy Care Assessment and Management (repouching, etc.) PROCESS - Coordination of Care []  - 0 Simple Patient / Family Education for ongoing care []  - 0 Complex (extensive) Patient / Family Education for ongoing care []  - 0 Staff obtains Chiropractor, Records, T Results / Process Orders est []  - 0 Staff telephones HHA, Nursing Homes / Clarify orders / etc []  - 0 Routine Transfer to another Facility (non-emergent condition) []  - 0 Routine Hospital Admission (non-emergent condition) []  - 0 New Admissions / Manufacturing engineer / Ordering NPWT Apligraf, etc. , []  - 0 Emergency Hospital Admission (emergent condition) PROCESS - Special Needs []  - 0 Pediatric / Minor Patient Management []  - 0 Isolation Patient Management []  - 0 Hearing / Language / Visual special needs []  - 0 Assessment of Community assistance (transportation, D/C planning, etc.) []  - 0 Additional assistance / Altered mentation []  - 0 Support Surface(s) Assessment (bed, cushion, seat, etc.) INTERVENTIONS - Miscellaneous []  - 0 External ear exam []  - 0 Patient Transfer (multiple staff / Nurse, adult / Similar devices) []  - 0 Simple Staple / Suture removal (25 or less) []  - 0 Complex Staple / Suture removal (26 or more) []  - 0 Hypo/Hyperglycemic Management (do not check if billed separately) []  - 0 Ankle / Brachial Index (ABI) - do not check if billed separately Has the patient been seen at the hospital within the last three years: Yes Total Score: 0 Level Of Care: ____ Electronic  Signature(s) Signed: 05/29/2023 4:57:37 PM By: Midge Aver MSN RN CNS WTA Entered By: Midge Aver  05/29/2023 3:45 PM Medical Record Number: [id number] Patient Account Number: [id number] Date of Birth/Sex: Treating RN: Dec 12, 1950 (72 y.o. [name] Primary Care Euva Rundell: Kerby Nora Other Clinician: Referring Gyanna Jarema: Treating Shanedra Lave/Extender: [name], [name] in Treatment: 79 Active Inactive Wound/Skin Impairment Nursing Diagnoses: BERTRAN, ZEIMET ([id number]) [id number] Page 5 of 11 Impaired tissue integrity Knowledge deficit related to ulceration/compromised skin integrity Goals: Patient/caregiver will verbalize understanding of skin care regimen Date Initiated: 02/01/2023 Date Inactivated: 02/28/2023 Target Resolution Date: 03/04/2023 Goal Status: Met Ulcer/skin breakdown will have a volume reduction of 30% by week 4 Date Initiated: 02/01/2023 Date Inactivated: 02/28/2023 Target Resolution Date: 03/04/2023 Goal Status: Met Ulcer/skin breakdown will have a volume reduction of 50% by week 8 Date Initiated: 02/01/2023 Date Inactivated: 04/11/2023 Target Resolution Date: 04/04/2023 Goal Status:  Met Ulcer/skin breakdown will have a volume reduction of 80% by week 12 Date Initiated: 02/01/2023 Target Resolution Date: 07/05/2023 Goal Status: Active Interventions: Assess patient/caregiver ability to obtain necessary supplies Assess patient/caregiver ability to perform ulcer/skin care regimen upon admission and as needed Assess ulceration(s) every visit Provide education on ulcer and skin care Treatment Activities: Referred to DME Monzerrat Wellen for dressing supplies : 01/31/2023 Skin care regimen initiated : 01/31/2023 Notes: Electronic Signature(s) Signed: 05/29/2023 4:57:37 PM By: Midge Aver MSN RN CNS WTA Previous Signature: 05/29/2023 3:51:29 PM Version By: Midge Aver MSN RN CNS WTA Entered By: Midge Aver on 05/29/2023 16:16:25 -------------------------------------------------------------------------------- Pain Assessment Details Patient Name: Date of Service: William Albright HN H. 05/29/2023 3:45 PM Medical Record Number: [id number] Patient Account Number: [id number] Date of Birth/Sex: Treating RN: 21-Mar-1951 (72 y.o. [name] Primary Care Genevia Bouldin: Kerby Nora Other Clinician: Referring Mahnoor Mathisen: Treating Gerad Cornelio/Extender: [name], [name] in Treatment: 16 Active Problems Location of Pain Severity and Description of Pain Patient Has Paino No Site Locations HOBY, KAWAI H ([id number]) [id number] Page 6 of 11 Pain Management and Medication Current Pain Management: Electronic Signature(s) Signed: 05/29/2023 4:57:37 PM By: Midge Aver MSN RN CNS WTA Entered By: Midge Aver on 05/29/2023 15:23:07 -------------------------------------------------------------------------------- Patient/Caregiver Education Details Patient Name: Date of Service: [name] 9/30/2024andnbsp3:45 PM Medical Record Number: [id number] Patient Account Number: [id number] Date of Birth/Gender: Treating RN: 1950-11-04 (72 y.o. [name] Primary  Care Physician: Kerby Nora Other Clinician: Referring Physician: Treating Physician/Extender: [name], [name] in Treatment: 16 Education Assessment Education Provided To: Patient Education Topics Provided Wound/Skin Impairment: Handouts: Caring for Your Ulcer Methods: Explain/Verbal Responses: State content correctly Electronic Signature(s) Signed: 05/29/2023 4:57:37 PM By: Midge Aver MSN RN CNS WTA Entered By: Midge Aver on 05/29/2023 16:16:31 [name] ([id number]) [id number] Page 7 of 11 -------------------------------------------------------------------------------- Wound Assessment Details Patient Name: Date of Service: [name] 05/29/2023 3:45 PM Medical Record Number: [id number] Patient Account Number: [id number] Date of Birth/Sex: Treating RN: Jul 16, 1951 (72 y.o. [name] Primary Care Desera Graffeo: Kerby Nora Other Clinician: Referring Amazing Cowman: Treating Amarachi Kotz/Extender: [name], [name] in Treatment: 16 Wound Status Wound Number: 1R Primary Diabetic Wound/Ulcer of the Lower Extremity Etiology: Wound Location: Right Calcaneus Wound Open Wounding Event: Gradually Appeared Status: Date Acquired: 12/10/2022 Comorbid Congestive Heart Failure, Coronary Artery Disease, Weeks Of Treatment: 16 History: Hypertension, Type II Diabetes, Neuropathy Clustered Wound: Yes Photos Wound Measurements Length: (cm) 1.3 Width: (cm) 0.5 Depth: (cm) 0.2 Area: (cm) 0.511 Volume: (cm) 0.102 % Reduction in Area: 88% % Reduction in Volume: 76.1% Epithelialization: Large (67-100%) Wound Description Classification: Grade 1 Exudate Amount: None Present Foul  on 05/29/2023 16:12:44 -------------------------------------------------------------------------------- Encounter Discharge Information Details Patient Name: Date of Service: [name] 05/29/2023 3:45 PM Medical Record Number: [id number] Patient Account Number: [id number] Date of Birth/Sex: Treating RN: 20-Feb-1951 (72 y.o. [name] Primary Care Judith Campillo: Kerby Nora Other Clinician: Referring Matti Minney: Treating Renn Stille/Extender: [name], [name] in Treatment: [address], [place] ([id number]) [id number] Page 3 of 11 Encounter Discharge Information Items Post Procedure Vitals Discharge Condition: Stable Temperature (F): 97.6 Ambulatory Status: Ambulatory Pulse (bpm): 82 Discharge Destination: Home Respiratory Rate (breaths/min): 18 Transportation: Private Auto Blood Pressure (mmHg): 137/65 Accompanied By: self Schedule Follow-up Appointment: Yes Clinical Summary of Care: Electronic Signature(s) Signed: 05/29/2023 4:57:37 PM By: Midge Aver MSN RN CNS WTA Entered By: Midge Aver on 05/29/2023 16:18:02 -------------------------------------------------------------------------------- Lower Extremity Assessment Details Patient Name: Date of Service: William Albright HN H. 05/29/2023 3:45 PM Medical Record Number: [id number] Patient Account Number: [id number] Date of Birth/Sex: Treating RN: 1950/12/13 (72 y.o. [name] Primary Care Shawonda Kerce: Kerby Nora Other Clinician: Referring Chalyn Amescua: Treating Cayce Paschal/Extender: [name], [name] in Treatment: 16 Electronic Signature(s) Signed: 05/29/2023 4:57:37 PM By: Midge Aver MSN RN CNS WTA Entered By: Midge Aver on 05/29/2023 15:41:15 -------------------------------------------------------------------------------- Multi Wound Chart Details Patient Name: Date of  Service: William Albright HN H. 05/29/2023 3:45 PM Medical Record Number: [id number] Patient Account Number: [id number] Date of Birth/Sex: Treating RN: 12-06-1950 (72 y.o. [name] Primary Care Manreet Kiernan: Kerby Nora Other Clinician: Referring Erin Obando: Treating Fahd Galea/Extender: [name], [name] in Treatment: 16 Vital Signs Height(in): 66 Pulse(bpm): 88 Weight(lbs): 250 Blood Pressure(mmHg): 145/75 Body Mass Index(BMI): 40.3 Temperature(F): 98.0 Respiratory Rate(breaths/min): 18 [1R:Photos:] [[id number] Page 4 of 11] Right Calcaneus Right, Distal, Dorsal Foot Right, Proximal, Dorsal Foot Wound Location: Gradually Appeared Gradually Appeared Gradually Appeared Wounding Event: Diabetic Wound/Ulcer of the Lower Diabetic Wound/Ulcer of the Lower Diabetic Wound/Ulcer of the Lower Primary Etiology: Extremity Extremity Extremity Congestive Heart Failure, Coronary Congestive Heart Failure, Coronary Congestive Heart Failure, Coronary Comorbid History: Artery Disease, Hypertension, Type II Artery Disease, Hypertension, Type II Artery Disease, Hypertension, Type II Diabetes, Neuropathy Diabetes, Neuropathy Diabetes, Neuropathy 12/10/2022 12/10/2022 12/10/2022 Date Acquired: 16 16 16  Weeks of Treatment: Open Open Open Wound Status: No No No Wound Recurrence: Yes No No Clustered Wound: 1.3x0.5x0.2 0.7x0.4x0.1 1.3x2x0.3 Measurements L x W x D (cm) 0.511 0.22 2.042 A (cm) : rea 0.102 0.022 0.613 Volume (cm) : 88.00% 74.50% 18.70% % Reduction in A rea: 76.10% 74.40% -144.20% % Reduction in Volume: Grade 1 Grade 1 Grade 1 Classification: None Present None Present Medium Exudate A mount: N/A N/A Serosanguineous Exudate Type: N/A N/A red, brown Exudate Color: Medium (34-66%) Medium (34-66%) Medium (34-66%) Granulation A mount: Red Red, Pink Red Granulation Quality: None Present (0%) None Present (0%) Medium (34-66%) Necrotic A  mount: Fat Layer (Subcutaneous Tissue): Yes Fat Layer (Subcutaneous Tissue): Yes Fat Layer (Subcutaneous Tissue): Yes Exposed Structures: Fascia: No Fascia: No Fascia: No Tendon: No Tendon: No Tendon: No Muscle: No Muscle: No Muscle: No Joint: No Joint: No Joint: No Bone: No Bone: No Bone: No Large (67-100%) Large (67-100%) Small (1-33%) Epithelialization: Treatment Notes Electronic Signature(s) Signed: 05/29/2023 4:57:37 PM By: Midge Aver MSN RN CNS WTA Previous Signature: 05/29/2023 3:53:59 PM Version By: Midge Aver MSN RN CNS WTA Previous Signature: 05/29/2023 3:50:57 PM Version By: Midge Aver MSN RN CNS WTA Entered By: Midge Aver on 05/29/2023 15:55:41 -------------------------------------------------------------------------------- Multi-Disciplinary Care Plan Details Patient Name: Date of Service: William Albright HN H.  Odor After Cleansing: No Slough/Fibrino No Wound Bed Granulation Amount: Medium (34-66%) Exposed Structure Granulation Quality: Red Fascia Exposed: No Necrotic Amount: None Present (0%) Fat Layer (Subcutaneous Tissue) Exposed: Yes Tendon Exposed:  No Muscle Exposed: No Joint Exposed: No Bone Exposed: No Treatment Notes Wound #1R (Calcaneus) Wound Laterality: Right Cleanser Soap and Water Discharge Instruction: Gently cleanse wound with antibacterial soap, rinse and pat dry prior to dressing wounds Peri-Wound Care TREYTON, SLIMP ([id number]) [id number] Page 8 of 11 Topical Primary Dressing Prisma 4.34 (in) Discharge Instruction: Moisten w/normal saline or sterile water; Cover wound as directed. Do not remove from wound bed. Secondary Dressing (BORDER) Zetuvit Plus SILICONE BORDER Dressing 4x4 (in/in) Discharge Instruction: Please do not put silicone bordered dressings under wraps. Use non-bordered dressing only. Secured With Compression Wrap Compression Stockings Facilities manager) Signed: 05/29/2023 4:57:37 PM By: Midge Aver MSN RN CNS WTA Entered By: Midge Aver on 05/29/2023 15:38:05 -------------------------------------------------------------------------------- Wound Assessment Details Patient Name: Date of Service: William Albright HN H. 05/29/2023 3:45 PM Medical Record Number: [id number] Patient Account Number: [id number] Date of Birth/Sex: Treating RN: 1951/06/10 (72 y.o. [name] Primary Care Izabel Chim: Kerby Nora Other Clinician: Referring Josalin Carneiro: Treating Sarabella Caprio/Extender: [name], [name] in Treatment: 16 Wound Status Wound Number: 2R Primary Diabetic Wound/Ulcer of the Lower Extremity Etiology: Wound Location: Right, Distal, Dorsal Foot Wound Open Wounding Event: Gradually Appeared Status: Date Acquired: 12/10/2022 Comorbid Congestive Heart Failure, Coronary Artery Disease, Weeks Of Treatment: 16 History: Hypertension, Type II Diabetes, Neuropathy Clustered Wound: No Photos Wound Measurements Length: (cm) 0.7 Width: (cm) 0.4 Depth: (cm) 0.1 Area: (cm) 0.22 Volume: (cm) 0.022 % Reduction in Area: 74.5% % Reduction in Volume:  74.4% Epithelialization: Large (67-100%) Wound Description AZION, CENTRELLA ([id number]) Classification: Grade 1 Exudate Amount: None Present [id number] Page 9 of 11 Foul Odor After Cleansing: No Slough/Fibrino No Wound Bed Granulation Amount: Medium (34-66%) Exposed Structure Granulation Quality: Red, Pink Fascia Exposed: No Necrotic Amount: None Present (0%) Fat Layer (Subcutaneous Tissue) Exposed: Yes Tendon Exposed: No Muscle Exposed: No Joint Exposed: No Bone Exposed: No Treatment Notes Wound #2R (Foot) Wound Laterality: Dorsal, Right, Distal Cleanser Soap and Water Discharge Instruction: Gently cleanse wound with antibacterial soap, rinse and pat dry prior to dressing wounds Peri-Wound Care Topical Primary Dressing Prisma 4.34 (in) Discharge Instruction: Moisten w/normal saline or sterile water; Cover wound as directed. Do not remove from wound bed. Secondary Dressing (BORDER) Zetuvit Plus SILICONE BORDER Dressing 4x4 (in/in) Discharge Instruction: Please do not put silicone bordered dressings under wraps. Use non-bordered dressing only. Secured With Compression Wrap Compression Stockings Facilities manager) Signed: 05/29/2023 4:57:37 PM By: Midge Aver MSN RN CNS WTA Entered By: Midge Aver on 05/29/2023 15:41:05 -------------------------------------------------------------------------------- Wound Assessment Details Patient Name: Date of Service: William Albright HN H. 05/29/2023 3:45 PM Medical Record Number: [id number] Patient Account Number: [id number] Date of Birth/Sex: Treating RN: 1950-12-20 (72 y.o. [name] Primary Care Jess Sulak: Kerby Nora Other Clinician: Referring Machelle Raybon: Treating Trena Dunavan/Extender: [name], [name] in Treatment: 16 Wound Status Wound Number: 3 Primary Diabetic Wound/Ulcer of the Lower Extremity Etiology: Wound Location: Right, Proximal, Dorsal Foot Wound Open Wounding  Event: Gradually Appeared Status: Date Acquired: 12/10/2022 Comorbid Congestive Heart Failure, Coronary Artery Disease, Weeks Of Treatment: 16 History: Hypertension, Type II Diabetes, Neuropathy Clustered Wound: No BRYDAN, DOWNARD ([id number]) [id number] Page 10 of 11 Photos Wound Measurements Length: (cm) 1.3 Width: (cm) 2 Depth: (cm) 0.3 Area: (cm) 2.042 Volume: (cm) 0.613 % Reduction

## 2023-05-29 NOTE — Progress Notes (Addendum)
Pre-procedure): Awake and Alert Pre-procedure Verification/Time Out Yes - 16:00 Taken: Start Time: 16:00 Percent of Wound Bed Debrided: Instrument: Other : saline gauze Bleeding: None Procedural Pain: 0 Post Procedural Pain: 0 Response to Treatment: Procedure was tolerated well Level of Consciousness (Post- Awake and Alert procedure): Post Debridement Measurements of Total Wound Length: (cm) 1.3 Width: (cm) 2 Depth: (cm) 0.3 Volume: (cm) 0.613 Character of Wound/Ulcer Post Debridement: Stable Severity of Tissue Post Debridement: Fat layer exposed Post Procedure Diagnosis Same as Pre-procedure Electronic Signature(s) Signed: 05/29/2023 4:50:01 PM By: Allen Derry PA-C Signed: 05/29/2023 4:57:37 PM By: Midge Aver MSN RN CNS WTA Entered By: Midge Aver on 05/29/2023 16:11:47 -------------------------------------------------------------------------------- HPI Details Patient Name: Date of Service: William Albright HN H. 05/29/2023 3:45 PM Medical Record Number: [id number] Patient Account Number: [id number] Date of Birth/Sex: Treating RN: 07-07-1951 (72 y.o. [name] Primary Care Provider: Kerby Nora Other  Clinician: Referring Provider: Treating Provider/Extender: [name], [name] in Treatment: [address] [place] ([id number]) [id number] Page 4 of 9 History of Present Illness HPI Description: 01-31-2023 upon evaluation today patient appears to be doing poorly currently in regard to wounds on his right heel, right dorsal foot, and right lateral foot. Subsequently he does have known peripheral vascular disease and again this is something that he needs to I think Checked formally. This is what the majority of the conversation today hinged around and the patient voiced understanding as far as that is concerned. Fortunately I do not see any signs of active infection locally nor systemically which is great news. Patient does have a history of diabetes mellitus type 2, atrial fibrillation for which she is on long-term anticoagulant Therapy, hypertension, and coronary artery disease. Patient's hemoglobin A1c most recently was on 11-17-2022 and was 7.2 and currently he is on Eliquis and Plavix. 02-07-2023 upon evaluation today patient appears to be doing well currently in regard to his wounds all things considered I feel like we are still maintaining that does not seem like it is any worse is also not significantly better. I discussed with the patient that I do believe he would benefit from the arterial evaluation we still need to get this done as quickly as possible and subsequently we did put in a follow-up call with the vascular office today with regard to this. 02-14-2023 upon evaluation today patient appears to be doing a little better in regard to his wounds which are showing signs of loosening which is great news. With that being said he is experiencing an improvement overall in his symptoms and we are still waiting on the vascular evaluation want to get this result and consider whether or not his blood flow is good or not we will be able to make a better  determination of next steps. For now we will try and avoid any aggressive sharp debridement to know that he has good arterial flow. 02-21-2023 upon evaluation today patient appears to be doing about the same in regard to his wound. Fortunately there does not appear to be any signs of active infection at this time which is great news. No fevers, chills, nausea, vomiting, or diarrhea. I did review patient's arterial study and it appears that he has pretty good flow in the left is not perfect but it is decent. On the right however he is definitely not doing nearly as good and I think that he is going to need to see one of the vascular doctors for further evaluation and treatment of this  By: Midge Aver MSN RN CNS WTA Entered By: Midge Aver on 05/29/2023 16:12:28 -------------------------------------------------------------------------------- Problem List Details Patient Name: Date of Service: William Albright HN H. 05/29/2023 3:45 PM Medical Record Number: [id number] Patient Account Number: [id number] Date of Birth/Sex: Treating RN: March 08, 1951 (72 y.o. [name] Primary Care Provider: Kerby Nora Other Clinician: Referring Provider: Treating Provider/Extender: [name], [name] in Treatment: 65 Active Problems ICD-10 Encounter Code Description Active Date MDM Diagnosis E11.621 Type 2 diabetes mellitus with foot ulcer 01/31/2023 No Yes L97.512 Non-pressure chronic ulcer of other part of right foot with fat layer exposed 01/31/2023 No Yes I25.10 Atherosclerotic heart disease of native coronary artery without angina pectoris 01/31/2023 No Yes I48.0 Paroxysmal atrial fibrillation 01/31/2023 No Yes [name], [name] ([id number]) [id number] Page 7 of 9 Z79.01 Long term (current) use of anticoagulants 01/31/2023 No Yes I10 Essential (primary) hypertension  01/31/2023 No Yes Inactive Problems Resolved Problems Electronic Signature(s) Signed: 05/29/2023 3:46:20 PM By: Allen Derry PA-C Entered By: Allen Derry on 05/29/2023 15:46:20 -------------------------------------------------------------------------------- Progress Note Details Patient Name: Date of Service: William Albright HN H. 05/29/2023 3:45 PM Medical Record Number: [id number] Patient Account Number: [id number] Date of Birth/Sex: Treating RN: 1951-02-13 (72 y.o. [name] Primary Care Provider: Kerby Nora Other Clinician: Referring Provider: Treating Provider/Extender: [name], [name] in Treatment: 16 Subjective Chief Complaint Information obtained from Patient Right foot ulcers History of Present Illness (HPI) 01-31-2023 upon evaluation today patient appears to be doing poorly currently in regard to wounds on his right heel, right dorsal foot, and right lateral foot. Subsequently he does have known peripheral vascular disease and again this is something that he needs to I think Checked formally. This is what the majority of the conversation today hinged around and the patient voiced understanding as far as that is concerned. Fortunately I do not see any signs of active infection locally nor systemically which is great news. Patient does have a history of diabetes mellitus type 2, atrial fibrillation for which she is on long-term anticoagulant Therapy, hypertension, and coronary artery disease. Patient's hemoglobin A1c most recently was on 11-17-2022 and was 7.2 and currently he is on Eliquis and Plavix. 02-07-2023 upon evaluation today patient appears to be doing well currently in regard to his wounds all things considered I feel like we are still maintaining that does not seem like it is any worse is also not significantly better. I discussed with the patient that I do believe he would benefit from the arterial evaluation we still need to get this done as quickly as  possible and subsequently we did put in a follow-up call with the vascular office today with regard to this. 02-14-2023 upon evaluation today patient appears to be doing a little better in regard to his wounds which are showing signs of loosening which is great news. With that being said he is experiencing an improvement overall in his symptoms and we are still waiting on the vascular evaluation want to get this result and consider whether or not his blood flow is good or not we will be able to make a better determination of next steps. For now we will try and avoid any aggressive sharp debridement to know that he has good arterial flow. 02-21-2023 upon evaluation today patient appears to be doing about the same in regard to his wound. Fortunately there does not appear to be any signs of active infection at this time which is great news. No fevers, chills, nausea, vomiting,  William Albright HN H. 05/29/2023 3:45 PM Medical Record Number: [id number] Patient Account Number: [id number] [name], [name] ([id number]) [id number] Page 5 of 9 Date of Birth/Sex: Treating RN: 07/02/1951 (72 y.o. [name] Primary Care Provider: Other Clinician: Kerby Nora Referring Provider: Treating Provider/Extender: [name], [name] in Treatment: 16 Constitutional Well-nourished and well-hydrated in no acute distress. Respiratory normal breathing without difficulty. Psychiatric this patient is able to make decisions and demonstrates good insight into disease process. Alert and Oriented x 3. pleasant and cooperative. Notes Upon inspection patient's wound bed actually showed signs of good granulation and epithelization at this point. Fortunately I do not see any signs of active infection at this time which is great news and in general I do believe that we are making excellent headway towards complete closure. Electronic Signature(s) Signed: 05/29/2023 4:08:02 PM By: Allen Derry PA-C Entered By: Allen Derry on 05/29/2023 16:08:02 -------------------------------------------------------------------------------- Physician Orders Details Patient Name: Date of Service: William Albright HN H. 05/29/2023 3:45 PM Medical Record Number: [id number] Patient Account Number: [id number] Date of Birth/Sex: Treating RN: 1951-05-20 (72 y.o. [name] Primary Care Provider: Kerby Nora Other Clinician: Referring Provider: Treating Provider/Extender: [name], [name] in Treatment: 63 Verbal / Phone  Orders: No Diagnosis Coding ICD-10 Coding Code Description E11.621 Type 2 diabetes mellitus with foot ulcer L97.512 Non-pressure chronic ulcer of other part of right foot with fat layer exposed I25.10 Atherosclerotic heart disease of native coronary artery without angina pectoris I48.0 Paroxysmal atrial fibrillation Z79.01 Long term (current) use of anticoagulants I10 Essential (primary) hypertension Follow-up Appointments Return Appointment in 1 week. Bathing/ [employer] wounds with antibacterial soap and water. Anesthetic (Use 'Patient Medications' Section for Anesthetic Order Entry) Lidocaine applied to wound bed Wound Treatment Wound #1R - Calcaneus Wound Laterality: Right Cleanser: Soap and Water Every Other Day/30 Days Discharge Instructions: Gently cleanse wound with antibacterial soap, rinse and pat dry prior to dressing wounds Prim Dressing: Prisma 4.34 (in) Every Other Day/30 Days ary Discharge Instructions: Moisten w/normal saline or sterile water; Cover wound as directed. Do not remove from wound bed. HUSSIEN, GREENBLATT ([id number]) [id number] Page 6 of 9 Secondary Dressing: (BORDER) Zetuvit Plus SILICONE BORDER Dressing 4x4 (in/in) (Generic) Every Other Day/30 Days Discharge Instructions: Please do not put silicone bordered dressings under wraps. Use non-bordered dressing only. Wound #2R - Foot Wound Laterality: Dorsal, Right, Distal Cleanser: Soap and Water Every Other Day/30 Days Discharge Instructions: Gently cleanse wound with antibacterial soap, rinse and pat dry prior to dressing wounds Prim Dressing: Prisma 4.34 (in) Every Other Day/30 Days ary Discharge Instructions: Moisten w/normal saline or sterile water; Cover wound as directed. Do not remove from wound bed. Secondary Dressing: (BORDER) Zetuvit Plus SILICONE BORDER Dressing 4x4 (in/in) (Generic) Every Other Day/30 Days Discharge Instructions: Please do not put silicone  bordered dressings under wraps. Use non-bordered dressing only. Wound #3 - Foot Wound Laterality: Dorsal, Right, Proximal Cleanser: Soap and Water Every Other Day/30 Days Discharge Instructions: Gently cleanse wound with antibacterial soap, rinse and pat dry prior to dressing wounds Prim Dressing: Prisma 4.34 (in) Every Other Day/30 Days ary Discharge Instructions: Moisten w/normal saline or sterile water; Cover wound as directed. Do not remove from wound bed. Secondary Dressing: (BORDER) Zetuvit Plus SILICONE BORDER Dressing 4x4 (in/in) (Generic) Every Other Day/30 Days Discharge Instructions: Please do not put silicone bordered dressings under wraps. Use non-bordered dressing only. Electronic Signature(s) Signed: 05/29/2023 4:50:01 PM By: Allen Derry PA-C Signed: 05/29/2023 4:57:37 PM  By: Midge Aver MSN RN CNS WTA Entered By: Midge Aver on 05/29/2023 16:12:28 -------------------------------------------------------------------------------- Problem List Details Patient Name: Date of Service: William Albright HN H. 05/29/2023 3:45 PM Medical Record Number: [id number] Patient Account Number: [id number] Date of Birth/Sex: Treating RN: March 08, 1951 (72 y.o. [name] Primary Care Provider: Kerby Nora Other Clinician: Referring Provider: Treating Provider/Extender: [name], [name] in Treatment: 65 Active Problems ICD-10 Encounter Code Description Active Date MDM Diagnosis E11.621 Type 2 diabetes mellitus with foot ulcer 01/31/2023 No Yes L97.512 Non-pressure chronic ulcer of other part of right foot with fat layer exposed 01/31/2023 No Yes I25.10 Atherosclerotic heart disease of native coronary artery without angina pectoris 01/31/2023 No Yes I48.0 Paroxysmal atrial fibrillation 01/31/2023 No Yes [name], [name] ([id number]) [id number] Page 7 of 9 Z79.01 Long term (current) use of anticoagulants 01/31/2023 No Yes I10 Essential (primary) hypertension  01/31/2023 No Yes Inactive Problems Resolved Problems Electronic Signature(s) Signed: 05/29/2023 3:46:20 PM By: Allen Derry PA-C Entered By: Allen Derry on 05/29/2023 15:46:20 -------------------------------------------------------------------------------- Progress Note Details Patient Name: Date of Service: William Albright HN H. 05/29/2023 3:45 PM Medical Record Number: [id number] Patient Account Number: [id number] Date of Birth/Sex: Treating RN: 1951-02-13 (72 y.o. [name] Primary Care Provider: Kerby Nora Other Clinician: Referring Provider: Treating Provider/Extender: [name], [name] in Treatment: 16 Subjective Chief Complaint Information obtained from Patient Right foot ulcers History of Present Illness (HPI) 01-31-2023 upon evaluation today patient appears to be doing poorly currently in regard to wounds on his right heel, right dorsal foot, and right lateral foot. Subsequently he does have known peripheral vascular disease and again this is something that he needs to I think Checked formally. This is what the majority of the conversation today hinged around and the patient voiced understanding as far as that is concerned. Fortunately I do not see any signs of active infection locally nor systemically which is great news. Patient does have a history of diabetes mellitus type 2, atrial fibrillation for which she is on long-term anticoagulant Therapy, hypertension, and coronary artery disease. Patient's hemoglobin A1c most recently was on 11-17-2022 and was 7.2 and currently he is on Eliquis and Plavix. 02-07-2023 upon evaluation today patient appears to be doing well currently in regard to his wounds all things considered I feel like we are still maintaining that does not seem like it is any worse is also not significantly better. I discussed with the patient that I do believe he would benefit from the arterial evaluation we still need to get this done as quickly as  possible and subsequently we did put in a follow-up call with the vascular office today with regard to this. 02-14-2023 upon evaluation today patient appears to be doing a little better in regard to his wounds which are showing signs of loosening which is great news. With that being said he is experiencing an improvement overall in his symptoms and we are still waiting on the vascular evaluation want to get this result and consider whether or not his blood flow is good or not we will be able to make a better determination of next steps. For now we will try and avoid any aggressive sharp debridement to know that he has good arterial flow. 02-21-2023 upon evaluation today patient appears to be doing about the same in regard to his wound. Fortunately there does not appear to be any signs of active infection at this time which is great news. No fevers, chills, nausea, vomiting,  in 1 week here in the clinic. If anything worsens or changes patient will contact our office for additional recommendations. Electronic Signature(s) Signed: 05/29/2023 4:09:15 PM By: Allen Derry PA-C Entered By: Allen Derry on 05/29/2023 16:09:15 -------------------------------------------------------------------------------- SuperBill Details Patient Name: Date of Service: William Albright HN H. 05/29/2023 Medical Record Number: [id number] Patient Account Number: [id number] Date of Birth/Sex: Treating RN: 09/24/1950 (72 y.o. [name] Primary Care Provider: Kerby Nora Other Clinician: Referring Provider: Treating Provider/Extender: [name], [name] in Treatment: 16 Diagnosis Coding ICD-10 Codes Code Description E11.621 Type 2 diabetes mellitus with foot ulcer L97.512 Non-pressure chronic ulcer of other part of right foot with fat layer exposed I25.10 Atherosclerotic heart disease of native coronary artery without angina pectoris I48.0 Paroxysmal atrial fibrillation Z79.01 Long term (current) use of anticoagulants I10 Essential (primary) hypertension Facility Procedures : CPT4 Code: 40981191 9 Description: 7602 - DEBRIDE W/O ANES NON SELECT Modifier: Quantity: 1 Electronic Signature(s) Signed: 05/29/2023 4:50:01 PM By: Allen Derry PA-C Signed: 05/29/2023 4:57:37 PM By: Midge Aver MSN RN CNS WTA Previous Signature: 05/29/2023 4:14:56 PM Version By: Allen Derry PA-C Entered By: Midge Aver on 05/29/2023 16:16:17  in 1 week here in the clinic. If anything worsens or changes patient will contact our office for additional recommendations. Electronic Signature(s) Signed: 05/29/2023 4:09:15 PM By: Allen Derry PA-C Entered By: Allen Derry on 05/29/2023 16:09:15 -------------------------------------------------------------------------------- SuperBill Details Patient Name: Date of Service: William Albright HN H. 05/29/2023 Medical Record Number: [id number] Patient Account Number: [id number] Date of Birth/Sex: Treating RN: 09/24/1950 (72 y.o. [name] Primary Care Provider: Kerby Nora Other Clinician: Referring Provider: Treating Provider/Extender: [name], [name] in Treatment: 16 Diagnosis Coding ICD-10 Codes Code Description E11.621 Type 2 diabetes mellitus with foot ulcer L97.512 Non-pressure chronic ulcer of other part of right foot with fat layer exposed I25.10 Atherosclerotic heart disease of native coronary artery without angina pectoris I48.0 Paroxysmal atrial fibrillation Z79.01 Long term (current) use of anticoagulants I10 Essential (primary) hypertension Facility Procedures : CPT4 Code: 40981191 9 Description: 7602 - DEBRIDE W/O ANES NON SELECT Modifier: Quantity: 1 Electronic Signature(s) Signed: 05/29/2023 4:50:01 PM By: Allen Derry PA-C Signed: 05/29/2023 4:57:37 PM By: Midge Aver MSN RN CNS WTA Previous Signature: 05/29/2023 4:14:56 PM Version By: Allen Derry PA-C Entered By: Midge Aver on 05/29/2023 16:16:17  in 1 week here in the clinic. If anything worsens or changes patient will contact our office for additional recommendations. Electronic Signature(s) Signed: 05/29/2023 4:09:15 PM By: Allen Derry PA-C Entered By: Allen Derry on 05/29/2023 16:09:15 -------------------------------------------------------------------------------- SuperBill Details Patient Name: Date of Service: William Albright HN H. 05/29/2023 Medical Record Number: [id number] Patient Account Number: [id number] Date of Birth/Sex: Treating RN: 09/24/1950 (72 y.o. [name] Primary Care Provider: Kerby Nora Other Clinician: Referring Provider: Treating Provider/Extender: [name], [name] in Treatment: 16 Diagnosis Coding ICD-10 Codes Code Description E11.621 Type 2 diabetes mellitus with foot ulcer L97.512 Non-pressure chronic ulcer of other part of right foot with fat layer exposed I25.10 Atherosclerotic heart disease of native coronary artery without angina pectoris I48.0 Paroxysmal atrial fibrillation Z79.01 Long term (current) use of anticoagulants I10 Essential (primary) hypertension Facility Procedures : CPT4 Code: 40981191 9 Description: 7602 - DEBRIDE W/O ANES NON SELECT Modifier: Quantity: 1 Electronic Signature(s) Signed: 05/29/2023 4:50:01 PM By: Allen Derry PA-C Signed: 05/29/2023 4:57:37 PM By: Midge Aver MSN RN CNS WTA Previous Signature: 05/29/2023 4:14:56 PM Version By: Allen Derry PA-C Entered By: Midge Aver on 05/29/2023 16:16:17  in 1 week here in the clinic. If anything worsens or changes patient will contact our office for additional recommendations. Electronic Signature(s) Signed: 05/29/2023 4:09:15 PM By: Allen Derry PA-C Entered By: Allen Derry on 05/29/2023 16:09:15 -------------------------------------------------------------------------------- SuperBill Details Patient Name: Date of Service: William Albright HN H. 05/29/2023 Medical Record Number: [id number] Patient Account Number: [id number] Date of Birth/Sex: Treating RN: 09/24/1950 (72 y.o. [name] Primary Care Provider: Kerby Nora Other Clinician: Referring Provider: Treating Provider/Extender: [name], [name] in Treatment: 16 Diagnosis Coding ICD-10 Codes Code Description E11.621 Type 2 diabetes mellitus with foot ulcer L97.512 Non-pressure chronic ulcer of other part of right foot with fat layer exposed I25.10 Atherosclerotic heart disease of native coronary artery without angina pectoris I48.0 Paroxysmal atrial fibrillation Z79.01 Long term (current) use of anticoagulants I10 Essential (primary) hypertension Facility Procedures : CPT4 Code: 40981191 9 Description: 7602 - DEBRIDE W/O ANES NON SELECT Modifier: Quantity: 1 Electronic Signature(s) Signed: 05/29/2023 4:50:01 PM By: Allen Derry PA-C Signed: 05/29/2023 4:57:37 PM By: Midge Aver MSN RN CNS WTA Previous Signature: 05/29/2023 4:14:56 PM Version By: Allen Derry PA-C Entered By: Midge Aver on 05/29/2023 16:16:17

## 2023-05-29 NOTE — Progress Notes (Unsigned)
Date:  05/29/2023   ID:  [name], DOB Dec 03, 1950, MRN [id number]  Patient Location:  [address] [place] [address]   Provider location:   [facility], [place] office  PCP:  [name], MD  Cardiologist:  [facility]  No chief complaint on file.    History of Present Illness:    [name] is a 72 y.o. male past medical history of morbid obesity,  HTN,  DM,  CAD  NSTEMI in 11/2015 (PCI to LAD; was discharged on Plavix) and again 02/2016 (no PCI; was discharged on Brilinta), Again presented to [facility] on 04/05/16 for NSTEMI (no PCI),  catheterization 04/06/2016, stable LAD stent, occluded RCA with collaterals Chronic total occlusion of RCA EF 55 to 60% Chronic chest pain syndrome GERD Who presents for follow-up of his coronary artery disease  Last office visit with myself June 2024 He presents today with his daughter  On last clinic visit diagnosed with atrial fibrillation On carvedilol 6.25 twice daily for rate control, Eliquis 5 twice daily Interested in restoring normal sinus rhythm Reports that he is relatively sedentary, asymptomatic from his atrial fibrillation Trace lower extremity edema, on Lasix daily 40 mg  May need workup for diabetic ulcers Likely needs lower extremity arterial angiography with vascular  EKG personally reviewed by myself on todays visit      Long history of atypical chest pain, Numerous cardiac catheterizations including July 2017, August 2017, treated medically   Prior catheterization Chronic total occlusion of the right coronary in the proximal segment well collateralized from the left circumflex and LAD. Widely patent proximal to mid LAD stent previously placed in July. There is first diagonal diagonal and LAD 30 and 50% narrowing respectively. Widely patent circumflex unchanged from previous with 70% narrowing in a small branch of the first marginal. Circumflex collateralizes the  distal right coronary left ventricular branch. Inferobasal hypokinesis. EF 50%. EDP is normal. Unable to identify a significant change in the angiographic appearance since prior study in July.   Past Medical History:  Diagnosis Date   Back injury 02/2002   worker's comp   CHF (congestive heart failure) (HCC)    Coronary artery disease, non-occlusive    a. cath 2002 with no sig CAD;  b. cath 2008 normal LM, LAD, LCx, p&dRCA 20-30%, PDA 30%; c.11/2015 NSTEMI/PCI: LM nl, LAD 95p (2.5x15 Xience DES), LCX nl, RCA 100p/m w/ L->R collats, EF 55-65% c. NSTEMI (02/2016) with no culprit leision, switched to Brilinta.  d. NSTEMI 03/2016: again, no culprit lesion and switched back to plavix 2/2 SOB with Brilnta.     Depression    Diabetes mellitus type 2, insulin dependent (HCC)    Hyperlipemia    Hypertension    Hypertensive heart disease    Kidney stones    Macular degeneration    Morbid obesity (HCC)    Osteoarthritis    Snoring    Past Surgical History:  Procedure Laterality Date   CARDIAC CATHETERIZATION  09/29/2000   diffuse LAD 30% LCA  EF 50-60%   CARDIAC CATHETERIZATION  06/30/2007   no significant CAD   CARDIAC CATHETERIZATION N/A 12/25/2015   Procedure: Left Heart Cath and Coronary Angiography;  Surgeon: Antonieta Iba, MD;  Location: [facility] INVASIVE CV LAB;  Service: Cardiovascular;  Laterality: N/A;   CARDIAC CATHETERIZATION N/A 12/25/2015   Procedure: Coronary Stent Intervention;  Surgeon: Alwyn Pea, MD;  Location: [facility] INVASIVE CV LAB;  Service:  Cardiovascular;  Laterality: N/A;   CARDIAC CATHETERIZATION N/A 03/10/2016   Procedure: Left Heart Cath and Coronary Angiography;  Surgeon: Iran Ouch, MD;  Location: [facility] INVASIVE CV LAB;  Service: Cardiovascular;  Laterality: N/A;   CARDIAC CATHETERIZATION N/A 04/06/2016   Procedure: Left Heart Cath and Coronary Angiography;  Surgeon: Lyn Records, MD;  Location: [facility] INVASIVE CV LAB;  Service: Cardiovascular;  Laterality:  N/A;   CARDIOVERSION N/A 03/03/2023   Procedure: CARDIOVERSION;  Surgeon: Antonieta Iba, MD;  Location: [facility] ORS;  Service: Cardiovascular;  Laterality: N/A;   CIRCUMCISION     CORONARY ANGIOPLASTY     LOWER EXTREMITY ANGIOGRAPHY Right 03/27/2023   Procedure: Lower Extremity Angiography;  Surgeon: Annice Needy, MD;  Location: [facility] INVASIVE CV LAB;  Service: Cardiovascular;  Laterality: Right;     No outpatient medications have been marked as taking for the 05/30/23 encounter (Appointment) with Antonieta Iba, MD.     Allergies:   Bupropion, Ozempic (0.25 or 0.5 mg-dose) [semaglutide(0.25 or 0.5mg -dos)], and Atorvastatin   Social History   Tobacco Use   Smoking status: Never   Smokeless tobacco: Never  Vaping Use   Vaping status: Never Used  Substance Use Topics   Alcohol use: No    Alcohol/week: 0.0 standard drinks of alcohol   Drug use: No     Family Hx: [name] family history includes Alzheimer's disease in his mother; Cancer in his brother; Diabetes in his father; Emphysema in his mother; Heart disease in his father.  ROS:   Please see the history of present illness.    Review of Systems  Constitutional: Negative.   Respiratory: Negative.    Cardiovascular: Negative.   Gastrointestinal: Negative.   Musculoskeletal: Negative.   Neurological: Negative.   Psychiatric/Behavioral: Negative.    All other systems reviewed and are negative.     Labs/Other Tests and Data Reviewed:    Recent Labs: 11/05/2022: B Natriuretic Peptide 1,240.5; Magnesium 1.9 01/30/2023: TSH 2.224 05/09/2023: Hemoglobin 13.8; Platelets 256 05/18/2023: ALT 17; BUN 25; Creatinine, Ser 1.15; Potassium 4.3; Sodium 140   Recent Lipid Panel Lab Results  Component Value Date/Time   CHOL 146 05/18/2023 08:02 AM   TRIG 76.0 05/18/2023 08:02 AM   HDL 38.60 (L) 05/18/2023 08:02 AM   CHOLHDL 4 05/18/2023 08:02 AM   LDLCALC 92 05/18/2023 08:02 AM   LDLDIRECT 174.0 10/19/2017 04:00 PM    Wt  Readings from Last 3 Encounters:  05/25/23 253 lb 6 oz (114.9 kg)  05/16/23 247 lb (112 kg)  05/09/23 252 lb 3.2 oz (114.4 kg)     Exam:   There were no vitals taken for this visit. Constitutional:  oriented to person, place, and time. No distress.  HENT:  Head: Grossly normal Eyes:  no discharge. No scleral icterus.  Neck: No JVD, no carotid bruits  Cardiovascular: Regular rate and rhythm, no murmurs appreciated Pulmonary/Chest: Clear to auscultation bilaterally, no wheezes or rails Abdominal: Soft.  no distension.  no tenderness.  Musculoskeletal: Normal range of motion Neurological:  normal muscle tone. Coordination normal. No atrophy Skin: Skin warm and dry Psychiatric: normal affect, pleasant   ASSESSMENT & PLAN:   Atrial fibrillation, persistent We have recommended he increase carvedilol up to 12.5 twice daily Continue Eliquis 5 twice daily We will arrange cardioversion next week, orders placed, Will need to hold some of his diabetes medications in preparation for general anesthesia  Coronary artery disease of native artery of native heart with stable angina pectoris (  HCC) Echocardiogram March 2024 EF 40 to 45% Currently with no symptoms of angina. No further workup at this time. Continue current medication regimen.  Morbid obesity (HCC) We have encouraged continued exercise, careful diet management in an effort to lose weight.  Hyperlipidemia LDL goal <70 Cholesterol is at goal on the current lipid regimen. No changes to the medications were made.  Only on Zetia Statin intolerance  Essential hypertension, benign Blood pressure is well controlled on today's visit.  Carvedilol up to 12.5 twice daily  DM (diabetes mellitus), secondary, uncontrolled, w/eye complications (HCC) A1c stable 6.6, improving on current medications  GERD: Stable, takes PPI   Total encounter time more than 40 minutes  Greater than 50% was spent in counseling and coordination of care with  the patient    Signed, Julien Nordmann, MD  05/29/2023 6:29 PM    [facility] [address], [place], [place] [address]

## 2023-05-30 ENCOUNTER — Encounter: Payer: Self-pay | Admitting: Cardiovascular Disease

## 2023-05-30 ENCOUNTER — Ambulatory Visit: Payer: PPO | Attending: Cardiovascular Disease | Admitting: Cardiovascular Disease

## 2023-05-30 VITALS — BP 110/52 | HR 76 | Ht 66.0 in | Wt 256.4 lb

## 2023-05-30 DIAGNOSIS — E1169 Type 2 diabetes mellitus with other specified complication: Secondary | ICD-10-CM

## 2023-05-30 DIAGNOSIS — E11621 Type 2 diabetes mellitus with foot ulcer: Secondary | ICD-10-CM | POA: Diagnosis not present

## 2023-05-30 DIAGNOSIS — I5022 Chronic systolic (congestive) heart failure: Secondary | ICD-10-CM | POA: Diagnosis not present

## 2023-05-30 DIAGNOSIS — I4819 Other persistent atrial fibrillation: Secondary | ICD-10-CM

## 2023-05-30 DIAGNOSIS — I70299 Other atherosclerosis of native arteries of extremities, unspecified extremity: Secondary | ICD-10-CM

## 2023-05-30 DIAGNOSIS — L97909 Non-pressure chronic ulcer of unspecified part of unspecified lower leg with unspecified severity: Secondary | ICD-10-CM | POA: Diagnosis not present

## 2023-05-30 DIAGNOSIS — E785 Hyperlipidemia, unspecified: Secondary | ICD-10-CM

## 2023-05-30 DIAGNOSIS — I959 Hypotension, unspecified: Secondary | ICD-10-CM

## 2023-05-30 DIAGNOSIS — Z794 Long term (current) use of insulin: Secondary | ICD-10-CM

## 2023-05-30 DIAGNOSIS — I10 Essential (primary) hypertension: Secondary | ICD-10-CM | POA: Diagnosis not present

## 2023-05-30 DIAGNOSIS — E11319 Type 2 diabetes mellitus with unspecified diabetic retinopathy without macular edema: Secondary | ICD-10-CM | POA: Diagnosis not present

## 2023-05-30 DIAGNOSIS — I2510 Atherosclerotic heart disease of native coronary artery without angina pectoris: Secondary | ICD-10-CM | POA: Diagnosis not present

## 2023-05-30 DIAGNOSIS — I739 Peripheral vascular disease, unspecified: Secondary | ICD-10-CM

## 2023-05-30 DIAGNOSIS — I5020 Unspecified systolic (congestive) heart failure: Secondary | ICD-10-CM | POA: Diagnosis not present

## 2023-05-30 MED ORDER — EZETIMIBE 10 MG PO TABS: 10.0000 mg | ORAL_TABLET | Freq: Every day | ORAL | 3 refills | Status: DC

## 2023-05-30 NOTE — Patient Instructions (Signed)
Medication Instructions:  ?No changes ? ?If you need a refill on your cardiac medications before your next appointment, please call your pharmacy.  ? ?Lab work: ?No new labs needed ? ?Testing/Procedures: ?No new testing needed ? ?Follow-Up: ?At [facility], you and your health needs are our priority.  As part of our continuing mission to provide you with exceptional heart care, we have created designated Provider Care Teams.  These Care Teams include your primary Cardiologist (physician) and Advanced Practice Providers (APPs -  Physician Assistants and Nurse Practitioners) who all work together to provide you with the care you need, when you need it. ? ?You will need a follow up appointment in 6 months, APP ok ? ?Providers on your designated Care Team:   ?Christopher [name], NP ?Ryan Dunn, PA-C ?Cadence Furth, PA-C ? ?COVID-19 Vaccine Information can be found at: [email or web address] For questions related to vaccine distribution or appointments, please email [email or web address] or call [phone].  ? ?

## 2023-05-31 ENCOUNTER — Other Ambulatory Visit (HOSPITAL_COMMUNITY): Payer: Self-pay

## 2023-05-31 ENCOUNTER — Other Ambulatory Visit: Payer: Self-pay | Admitting: Family

## 2023-05-31 NOTE — Progress Notes (Unsigned)
Paramedicine Encounter    Patient ID: [name], male    DOB: 1951-07-18, 72 y.o.   MRN: [id number]   Complaints-none  Edema-very little   Compliance with meds-yes  Pill box filled-n/a If so, by whom-does not use one   Refills needed-none   Pt was seen in gen card office yesterday for his afib. No med changes noted. Pt reports feeling ok. Nothing new. I gave him another month supply of eliquis samples.  He has a few more appoints this month.  Will check back on him in a couple wks.  He denies dizziness, no increased sob, no c/p.  Low on energy but that is ongoing.  Pt takes meds from bottles. All bottles are with the correct instructions and dosings.     BP 116/78   Pulse 84   Resp 16   Wt 252 lb (114.3 kg)   SpO2 95%   BMI 40.67 kg/m  Weight yesterday-256 @ office  Last visit weight-247  Patient Care Team: [name], MD as PCP - General Mariah Milling, Tollie Pizza, MD as PCP - Cardiology (Cardiology) Antonieta Iba, MD as Consulting Physician (Cardiology) Kathyrn Sheriff, [facility] (Inactive) as Pharmacist (Pharmacist)  Patient Active Problem List   Diagnosis Date Noted   PAD (peripheral artery disease) (HCC) 04/11/2023   Leg weakness, bilateral 04/11/2023   SOB (shortness of breath) 03/03/2023   Atrial fibrillation (HCC) 02/22/2023   Alteration in mobility associated with pain 01/12/2023   Diabetic ulcer of ankle (HCC) 01/12/2023   Peripheral neuropathy 01/12/2023   NSTEMI (non-ST elevated myocardial infarction) (HCC) 11/05/2022   Penis disorder 05/16/2022   Other fatigue 05/16/2022   Rash 05/12/2022   GAD (generalized anxiety disorder) 09/21/2021   Encounter for power mobility device assessment 05/13/2021   Statin myopathy 03/30/2021   Chronic midline low back pain 11/12/2020   Tinea corporis 04/10/2018   ED (erectile dysfunction) 03/06/2018   Acute on chronic diastolic CHF (congestive heart failure) (HCC) 09/20/2016   Adjustment disorder with mixed  anxiety and depressed mood 09/09/2016   Gastroesophageal reflux disease 09/09/2016   Elevated left ventricular end-diastolic pressure (LVEDP) 03/11/2016   Anemia 03/11/2016   Hypertensive heart disease with heart failure (HCC)    Diabetes mellitus type 2 with retinopathy (HCC)    Coronary artery disease of native artery of native heart with stable angina pectoris (HCC)    Morbid obesity (HCC)    Chronic chest pain    Diabetic retinopathy (HCC) 09/23/2014   Snoring 12/21/2012   HYPOGONADISM 09/28/2010   B12 deficiency 09/28/2010   Vitamin D deficiency 09/28/2010   OTHER MALAISE AND FATIGUE 09/17/2010   Trigger finger, acquired 09/14/2010   BRANCH RETINAL VEIN OCCLUSION 11/26/2009   Constipation 08/10/2009   Gastritis and gastroduodenitis 07/29/2009   Major depressive disorder, recurrent episode, moderate (HCC) 07/06/2007   RENAL CALCULUS, HX OF 03/26/2007   Hyperlipidemia associated with type 2 diabetes mellitus (HCC) 12/05/2006   Hypertension associated with diabetes (HCC) 12/05/2006   Osteoarthritis 12/05/2006    Current Outpatient Medications:    Acetaminophen (TYLENOL PO), Take 3-4 tablets by mouth as needed (pain)., Disp: , Rfl:    apixaban (ELIQUIS) 5 MG TABS tablet, Take 1 tablet (5 mg total) by mouth 2 (two) times daily., Disp: 56 tablet, Rfl: 0   carvedilol (COREG) 6.25 MG tablet, Take 1 tablet (6.25 mg total) by mouth 2 (two) times daily with a meal., Disp: 90 tablet, Rfl: 3   cholecalciferol (VITAMIN D3) 25 MCG (1000  UNIT) tablet, Take 2,000 Units by mouth daily., Disp: , Rfl:    clopidogrel (PLAVIX) 75 MG tablet, TAKE ONE TABLET BY MOUTH ONCE DAILY WITH BREAKFAST, Disp: 90 tablet, Rfl: 3   Continuous Blood Gluc Sensor (FREESTYLE LIBRE 2 SENSOR) MISC, APPLY SENSOR EVERY 14 DAYS TO MONITOR SUGAR CONTINOUSLY (Patient not taking: Reported on 05/30/2023), Disp: 2 each, Rfl: 11   Continuous Glucose Receiver (FREESTYLE LIBRE 2 READER) DEVI, USE WITH SENSORS TO MONITOR SUGAR  CONTINUOUSLY, Disp: 1 each, Rfl: 0   Dulaglutide (TRULICITY) 4.5 MG/0.5ML SOPN, Inject 4.5 mg into the skin once a week. Via [name] PAP, Disp: , Rfl:    empagliflozin (JARDIANCE) 10 MG TABS tablet, Take 1 tablet (10 mg total) by mouth daily., Disp: 30 tablet, Rfl: 5   ENTRESTO 24-26 MG, TAKE ONE TABLET BY MOUTH TWO (TWO) TIMES DAILY., Disp: 60 tablet, Rfl: 3   ezetimibe (ZETIA) 10 MG tablet, Take 1 tablet (10 mg total) by mouth daily., Disp: 90 tablet, Rfl: 3   furosemide (LASIX) 40 MG tablet, Take 40 mg by mouth daily., Disp: , Rfl:    gabapentin (NEURONTIN) 100 MG capsule, Take 1 capsule (100 mg total) by mouth at bedtime., Disp: 30 capsule, Rfl: 3   HYDROcodone-acetaminophen (NORCO/VICODIN) 5-325 MG tablet, Take 1-2 tablets by mouth daily as needed for moderate pain., Disp: 60 tablet, Rfl: 0   hydrOXYzine (ATARAX) 10 MG tablet, TAKE 1 TABLET BY MOUTH 3 TIMES DAILY AS NEEDED FOR ANXIETY, Disp: 30 tablet, Rfl: 2   insulin aspart (NOVOLOG FLEXPEN) 100 UNIT/ML FlexPen, Inject 13 Units into the skin every morning., Disp: , Rfl:    isosorbide mononitrate (IMDUR) 30 MG 24 hr tablet, Take 1 tablet (30 mg total) by mouth 2 (two) times daily., Disp: 60 tablet, Rfl: 2   nitroGLYCERIN (NITROSTAT) 0.4 MG SL tablet, DISSOLVE 1 TABLET UNDER TONGUE AS NEEDEDFOR CHEST PAIN. MAY REPEAT 5 MINUTES APART 3 TIMES IF NEEDED, Disp: 25 tablet, Rfl: 3   spironolactone (ALDACTONE) 25 MG tablet, TAKE ONE TABLET (25 MG TOTAL) BY MOUTH DAILY., Disp: 30 tablet, Rfl: 5   TRESIBA FLEXTOUCH 100 UNIT/ML FlexTouch Pen, INJECT 50 UNITS INTO THE SKIN DAILY (Patient not taking: Reported on 05/30/2023), Disp: 15 mL, Rfl: 2   TRUEPLUS 5-BEVEL PEN NEEDLES 31G X 6 MM MISC, USE TO INJECT INSULIN THREE TIMES A DAY (Patient not taking: Reported on 05/30/2023), Disp: 300 each, Rfl: 3   venlafaxine XR (EFFEXOR-XR) 150 MG 24 hr capsule, TAKE ONE CAPSULE BY MOUTH DAILY WITH BREAKFAST. TAKE WITH EFFEXOR XR 75MG  FOR A TOTAL OF 225MG , Disp: 90  capsule, Rfl: 0   venlafaxine XR (EFFEXOR-XR) 75 MG 24 hr capsule, TAKE ONE CAPSULE BY MOUTH ONCE DAILY. TAKE IN ADDITION TO THE 150 MG CAPSULE FOR A TOTAL DOSE OF 225MG  DAILY, Disp: 90 capsule, Rfl: 0   vitamin B-12 (CYANOCOBALAMIN) 1000 MCG tablet, Take 1,000 mcg by mouth daily., Disp: , Rfl:  Allergies  Allergen Reactions   Bupropion Nausea Only   Ozempic (0.25 Or 0.5 Mg-Dose) [Semaglutide(0.25 Or 0.5mg -Dos)] Nausea And Vomiting   Atorvastatin Other (See Comments)    Body aches Similar effect with rosuvastatin 40 mg twice weekly      Social History   Socioeconomic History   Marital status: Widowed    Spouse name: Not on file   Number of children: 3   Years of education: Not on file   Highest education level: 7th grade  Occupational History   Occupation: disabled  Employer: UNEMPLOYED    Comment: back injury  Tobacco Use   Smoking status: Never   Smokeless tobacco: Never  Vaping Use   Vaping status: Never Used  Substance and Sexual Activity   Alcohol use: No    Alcohol/week: 0.0 standard drinks of alcohol   Drug use: No   Sexual activity: Not Currently  Other Topics Concern   Not on file  Social History Narrative   Has a roommate, Mr. [name]. No pets.   Social Determinants of Health   Financial Resource Strain: Low Risk  (11/23/2022)   Overall Financial Resource Strain (CARDIA)    Difficulty of Paying Living Expenses: Not hard at all  Food Insecurity: No Food Insecurity (12/29/2022)   Hunger Vital Sign    Worried About Running Out of Food in the Last Year: Never true    Ran Out of Food in the Last Year: Never true  Transportation Needs: No Transportation Needs (12/29/2022)   PRAPARE - Administrator, Civil Service (Medical): No    Lack of Transportation (Non-Medical): No  Physical Activity: Inactive (11/09/2022)   Exercise Vital Sign    Days of Exercise per Week: 0 days    Minutes of Exercise per Session: 0 min  Stress: No Stress Concern Present  (11/09/2022)   [employer] of Occupational Health - Occupational Stress Questionnaire    Feeling of Stress : Only a little  Social Connections: Moderately Isolated (11/09/2022)   Social Connection and Isolation Panel [NHANES]    Frequency of Communication with Friends and Family: More than three times a week    Frequency of Social Gatherings with Friends and Family: More than three times a week    Attends Religious Services: More than 4 times per year    Active Member of [employer] or Organizations: No    Attends Banker Meetings: Never    Marital Status: Widowed  Intimate Partner Violence: Not At Risk (11/09/2022)   Humiliation, Afraid, Rape, and Kick questionnaire    Fear of Current or Ex-Partner: No    Emotionally Abused: No    Physically Abused: No    Sexually Abused: No    Physical Exam      Future Appointments  Date Time Provider Department Center  06/06/2023  2:45 PM Allen Derry [place] III, PA-C [facility] None  06/08/2023  3:20 PM [name], MD [facility]  06/13/2023  3:00 PM Delma Freeze, FNP [facility] None  11/14/2023 10:45 AM [facility] ANNUAL WELLNESS VISIT 1 [facility]  11/28/2023  3:35 PM Charlsie Quest, NP [facility] None       Kerry Hough, Paramedic [phone] [facility] Paramedic  06/01/23

## 2023-06-06 ENCOUNTER — Encounter: Payer: PPO | Attending: Physician Assistant | Admitting: Physician Assistant

## 2023-06-06 DIAGNOSIS — E11621 Type 2 diabetes mellitus with foot ulcer: Secondary | ICD-10-CM | POA: Insufficient documentation

## 2023-06-06 DIAGNOSIS — I2510 Atherosclerotic heart disease of native coronary artery without angina pectoris: Secondary | ICD-10-CM | POA: Diagnosis not present

## 2023-06-06 DIAGNOSIS — L97512 Non-pressure chronic ulcer of other part of right foot with fat layer exposed: Secondary | ICD-10-CM | POA: Insufficient documentation

## 2023-06-06 DIAGNOSIS — Z7901 Long term (current) use of anticoagulants: Secondary | ICD-10-CM | POA: Insufficient documentation

## 2023-06-06 DIAGNOSIS — I10 Essential (primary) hypertension: Secondary | ICD-10-CM | POA: Diagnosis not present

## 2023-06-06 DIAGNOSIS — I480 Paroxysmal atrial fibrillation: Secondary | ICD-10-CM | POA: Insufficient documentation

## 2023-06-06 DIAGNOSIS — L97412 Non-pressure chronic ulcer of right heel and midfoot with fat layer exposed: Secondary | ICD-10-CM | POA: Diagnosis not present

## 2023-06-06 NOTE — Progress Notes (Addendum)
[name]. 10/8/2024andnbsp2:45 PM Medical Record Number: [id number] Patient Account Number: [id number] [name], [name] ([id number]) [id number] Page 7 of 11 Date of Birth/Gender: Treating RN: Nov 26, 1950 (72 y.o. [name] Primary Care Physician: Kerby Nora Other Clinician: Betha Loa Referring Physician: Treating Physician/Extender: [name], [name] in Treatment: 49 Education Assessment Education Provided To: Patient Education Topics Provided Wound/Skin Impairment: Handouts: Other: continue wound care as directed Methods: Explain/Verbal Responses: State content  correctly Electronic Signature(s) Signed: 06/06/2023 5:04:16 PM By: Betha Loa Entered By: Betha Loa on 06/06/2023 17:01:28 -------------------------------------------------------------------------------- Wound Assessment Details Patient Name: Date of Service: [name] [name]. 06/06/2023 2:45 PM Medical Record Number: [id number] Patient Account Number: [id number] Date of Birth/Sex: Treating RN: 12/18/1950 (72 y.o. [name] Primary Care Otilia Kareem: Kerby Nora Other Clinician: Betha Loa Referring Arantza Darrington: Treating Jamarie Joplin/Extender: [name], [name] in Treatment: 18 Wound Status Wound Number: 1R Primary Diabetic Wound/Ulcer of the Lower Extremity Etiology: Wound Location: Right Calcaneus Wound Open Wounding Event: Gradually Appeared Status: Date Acquired: 12/10/2022 Comorbid Congestive Heart Failure, Coronary Artery Disease, Weeks Of Treatment: 18 History: Hypertension, Type II Diabetes, Neuropathy Clustered Wound: Yes Photos Wound Measurements Length: (cm) 1 Width: (cm) 0.4 Depth: (cm) 0.1 Area: (cm) 0.3 Volume: (cm) 0.0 [name], [name] [name] ([id number]) % Reduction in Area: 92.7% % Reduction in Volume: 92.7% Epithelialization: Large (67-100%) 14 31 [id number] Page 8 of 11 Wound Description Classification: Grade 1 Exudate Amount: None Present Foul Odor After Cleansing: No Slough/Fibrino No Wound Bed Granulation Amount: Medium (34-66%) Exposed Structure Granulation Quality: Red Fascia Exposed: No Necrotic Amount: None Present (0%) Fat Layer (Subcutaneous Tissue) Exposed: Yes Tendon Exposed: No Muscle Exposed: No Joint Exposed: No Bone Exposed: No Treatment Notes Wound #1R (Calcaneus) Wound Laterality: Right Cleanser Soap and Water Discharge Instruction: Gently cleanse wound with antibacterial soap, rinse and pat dry prior to dressing wounds Peri-Wound Care Topical Primary Dressing Prisma 4.34  (in) Discharge Instruction: Moisten w/normal saline or sterile water; Cover wound as directed. Do not remove from wound bed. Secondary Dressing Conforming Guaze Roll-Medium Discharge Instruction: Apply Conforming Stretch Guaze Bandage as directed Zetuvit Plus 4x4 (in/in) Secured With Compression Wrap Compression Stockings Facilities manager) Signed: 06/06/2023 5:02:18 PM By: Midge Aver MSN RN CNS WTA Signed: 06/06/2023 5:04:16 PM By: Betha Loa Entered By: Betha Loa on 06/06/2023 15:00:07 -------------------------------------------------------------------------------- Wound Assessment Details Patient Name: Date of Service: [name] [name]. 06/06/2023 2:45 PM Medical Record Number: [id number] Patient Account Number: [id number] Date of Birth/Sex: Treating RN: 1951-02-06 (72 y.o. [name] Primary Care Farran Amsden: Kerby Nora Other Clinician: Betha Loa Referring Davonne Baby: Treating Kashmere Staffa/Extender: [name], [name] in Treatment: 18 Wound Status Wound Number: 2R Primary Diabetic Wound/Ulcer of the Lower Extremity Etiology: Wound Location: Right, Distal, Dorsal Foot Wound Open Wounding Event: Gradually Appeared Status: Date Acquired: 12/10/2022 [name], [name] ([id number]) [id number] Page 9 of 11 Date Acquired: 12/10/2022 Comorbid Congestive Heart Failure, Coronary Artery Disease, Weeks Of Treatment: 18 History: Hypertension, Type II Diabetes, Neuropathy Clustered Wound: No Photos Wound Measurements Length: (cm) 1.5 Width: (cm) 1.5 Depth: (cm) 0.2 Area: (cm) 1.767 Volume: (cm) 0.353 % Reduction in Area: -104.5% % Reduction in Volume: -310.5% Epithelialization: Large (67-100%) Wound Description Classification: Grade 1 Exudate Amount: None Present Foul Odor After Cleansing: No Slough/Fibrino No Wound Bed Granulation Amount: Medium (34-66%) Exposed Structure Granulation Quality: Red, Pink Fascia  Exposed: No Necrotic Amount: None Present (0%) Fat Layer (Subcutaneous Tissue) Exposed: Yes Tendon Exposed: No Muscle Exposed: No Joint Exposed:  [name], [name] ([id number]) [id number] Page 1 of 11 Visit Report for 06/06/2023 Arrival Information Details Patient Name: Date of Service: [name] 06/06/2023 2:45 PM Medical Record Number: [id number] Patient Account Number: [id number] Date of Birth/Sex: Treating RN: 25-Oct-1950 (72 y.o. [name] Primary Care Eilene Voigt: Kerby Nora Other Clinician: Betha Loa Referring Avereigh Spainhower: Treating Arthuro Canelo/Extender: [name], [name] in Treatment: 18 Visit Information History Since Last Visit All ordered tests and consults were completed: No Patient Arrived: [name] Added or deleted any medications: No Arrival Time: 14:49 Any new allergies or adverse reactions: No Transfer Assistance: None Had a fall or experienced change in No Patient Identification Verified: Yes activities of daily living that may affect Secondary Verification Process Completed: Yes risk of falls: Patient Requires Transmission-Based Precautions: No Signs or symptoms of abuse/neglect since last visito No Patient Has Alerts: Yes Hospitalized since last visit: No Patient Alerts: Patient on Blood Thinner Implantable device outside of the clinic excluding No Diabetes type 2 cellular tissue based products placed in the center Eliquis/Plavix since last visit: ABI R 1.23 TBI 0.57 Has Dressing in Place as Prescribed: Yes ABI L 0.98 TBI 0.80 Pain Present Now: No Electronic Signature(s) Signed: 06/06/2023 5:04:16 PM By: Betha Loa Entered By: Betha Loa on 06/06/2023 14:50:16 -------------------------------------------------------------------------------- Clinic Level of Care Assessment Details Patient Name: Date of Service: [name] 06/06/2023 2:45 PM Medical Record Number: [id number] Patient Account Number: [id number] Date of Birth/Sex: Treating RN: May 29, 1951 (72 y.o. [name] Primary Care Rithvik Orcutt: Kerby Nora Other Clinician: Betha Loa Referring Heinz Eckert: Treating Khamauri Bauernfeind/Extender: [name], [name] in Treatment: 18 Clinic Level of Care Assessment Items TOOL 4 Quantity Score []  - 0 Use when only an EandM is performed on FOLLOW-UP visit ASSESSMENTS - Nursing Assessment / Reassessment X- 1 10 Reassessment of Co-morbidities (includes updates in patient status) X- 1 5 Reassessment of Adherence to Treatment Plan [name], [name] ([id number]) [id number] Page 2 of 11 ASSESSMENTS - Wound and Skin A ssessment / Reassessment []  - 0 Simple Wound Assessment / Reassessment - one wound X- 3 5 Complex Wound Assessment / Reassessment - multiple wounds []  - 0 Dermatologic / Skin Assessment (not related to wound area) ASSESSMENTS - Focused Assessment []  - 0 Circumferential Edema Measurements - multi extremities []  - 0 Nutritional Assessment / Counseling / Intervention []  - 0 Lower Extremity Assessment (monofilament, tuning fork, pulses) []  - 0 Peripheral Arterial Disease Assessment (using hand held doppler) ASSESSMENTS - Ostomy and/or Continence Assessment and Care []  - 0 Incontinence Assessment and Management []  - 0 Ostomy Care Assessment and Management (repouching, etc.) PROCESS - Coordination of Care X - Simple Patient / Family Education for ongoing care 1 15 []  - 0 Complex (extensive) Patient / Family Education for ongoing care []  - 0 Staff obtains Chiropractor, Records, T Results / Process Orders est []  - 0 Staff telephones HHA, Nursing Homes / Clarify orders / etc []  - 0 Routine Transfer to another Facility (non-emergent condition) []  - 0 Routine Hospital Admission (non-emergent condition) []  - 0 New Admissions / Manufacturing engineer / Ordering NPWT Apligraf, etc. , []  - 0 Emergency Hospital Admission (emergent condition) X- 1 10 Simple Discharge Coordination []  - 0 Complex (extensive) Discharge Coordination PROCESS - Special Needs []  - 0 Pediatric / Minor  Patient Management []  - 0 Isolation Patient Management []  - 0 Hearing / Language / Visual special needs []  - 0 Assessment of Community assistance (transportation, D/C planning, etc.) []  -  0 Additional assistance / Altered mentation []  - 0 Support Surface(s) Assessment (bed, cushion, seat, etc.) INTERVENTIONS - Wound Cleansing / Measurement []  - 0 Simple Wound Cleansing - one wound X- 3 5 Complex Wound Cleansing - multiple wounds X- 1 5 Wound Imaging (photographs - any number of wounds) []  - 0 Wound Tracing (instead of photographs) []  - 0 Simple Wound Measurement - one wound X- 3 5 Complex Wound Measurement - multiple wounds INTERVENTIONS - Wound Dressings X - Small Wound Dressing one or multiple wounds 3 10 []  - 0 Medium Wound Dressing one or multiple wounds []  - 0 Large Wound Dressing one or multiple wounds []  - 0 Application of Medications - topical []  - 0 Application of Medications - injection INTERVENTIONS - Miscellaneous []  - 0 External ear exam [name], [name] ([id number]) [id number] Page 3 of 11 []  - 0 Specimen Collection (cultures, biopsies, blood, body fluids, etc.) []  - 0 Specimen(s) / Culture(s) sent or taken to Lab for analysis []  - 0 Patient Transfer (multiple staff / Michiel Sites Lift / Similar devices) []  - 0 Simple Staple / Suture removal (25 or less) []  - 0 Complex Staple / Suture removal (26 or more) []  - 0 Hypo / Hyperglycemic Management (close monitor of Blood Glucose) []  - 0 Ankle / Brachial Index (ABI) - do not check if billed separately X- 1 5 Vital Signs Has the patient been seen at the hospital within the last three years: Yes Total Score: 125 Level Of Care: New/Established - Level 4 Electronic Signature(s) Signed: 06/06/2023 5:04:16 PM By: Betha Loa Entered By: Betha Loa on 06/06/2023 15:16:51 -------------------------------------------------------------------------------- Encounter Discharge Information  Details Patient Name: Date of Service: [name] [name]. 06/06/2023 2:45 PM Medical Record Number: [id number] Patient Account Number: [id number] Date of Birth/Sex: Treating RN: 03-18-1951 (73 y.o. [name] Primary Care Taleia Sadowski: Kerby Nora Other Clinician: Betha Loa Referring Bates Collington: Treating Lajune Perine/Extender: [name], [name] in Treatment: 18 Encounter Discharge Information Items Discharge Condition: Stable Ambulatory Status: Cane Discharge Destination: Home Transportation: Private Auto Accompanied By: self Schedule Follow-up Appointment: Yes Clinical Summary of Care: Electronic Signature(s) Signed: 06/06/2023 5:04:16 PM By: Betha Loa Entered By: Betha Loa on 06/06/2023 17:03:46 Lower Extremity Assessment Details -------------------------------------------------------------------------------- [name] ([id number]) [id number] Page 4 of 11 Patient Name: Date of Service: [name] 06/06/2023 2:45 PM Medical Record Number: [id number] Patient Account Number: [id number] Date of Birth/Sex: Treating RN: 12/12/1950 (72 y.o. [name] Primary Care Lorilee Cafarella: Kerby Nora Other Clinician: Betha Loa Referring Kaesyn Johnston: Treating Patty Lopezgarcia/Extender: [name], [name] in Treatment: 18 Electronic Signature(s) Signed: 06/06/2023 5:02:18 PM By: Midge Aver MSN RN CNS WTA Signed: 06/06/2023 5:04:16 PM By: Betha Loa Entered By: Betha Loa on 06/06/2023 15:01:24 -------------------------------------------------------------------------------- Multi Wound Chart Details Patient Name: Date of Service: [name] [name]. 06/06/2023 2:45 PM Medical Record Number: [id number] Patient Account Number: [id number] Date of Birth/Sex: Treating RN: [birth year] (72 y.o. [name] Primary Care Laurann Mcmorris: Kerby Nora Other Clinician: Betha Loa Referring Yostin Malacara: Treating Jayli Fogleman/Extender:  [name], [name] in Treatment: 18 Vital Signs Height(in): 66 Pulse(bpm): 83 Weight(lbs): 250 Blood Pressure(mmHg): 130/77 Body Mass Index(BMI): 40.3 Temperature(F): 97.8 Respiratory Rate(breaths/min): 18 [1R:Photos:] Right Calcaneus Right, Distal, Dorsal Foot Right, Proximal, Dorsal Foot Wound Location: Gradually Appeared Gradually Appeared Gradually Appeared Wounding Event: Diabetic Wound/Ulcer of the Lower Diabetic Wound/Ulcer of the Lower Diabetic Wound/Ulcer of the Lower Primary Etiology: Extremity Extremity Extremity Congestive Heart Failure, Coronary Congestive Heart Failure,  No Bone Exposed: No Treatment Notes Wound #2R (Foot) Wound Laterality: Dorsal, Right, Distal Cleanser Soap and Water Discharge Instruction: Gently cleanse wound with antibacterial soap, rinse and pat dry prior to dressing wounds Peri-Wound Care Topical Primary Dressing Prisma 4.34 (in) Discharge Instruction: Moisten w/normal saline or sterile water; Cover wound as directed. Do not remove from wound bed. Secondary Dressing Conforming Guaze Roll-Medium Discharge Instruction: Apply Conforming Stretch Guaze Bandage as directed Zetuvit Plus 4x4 (in/in) Secured With Compression Wrap Compression Stockings Facilities manager) Signed: 06/06/2023 5:02:18 PM By: Midge Aver MSN RN CNS WTA Signed: 06/06/2023 5:04:16 PM By: Betha Loa Entered By: Betha Loa on 06/06/2023 15:00:45 [name] ([id number]) [id number] Page 10 of 11 -------------------------------------------------------------------------------- Wound Assessment Details Patient Name: Date of Service: [name] 06/06/2023 2:45 PM Medical Record Number: [id number] Patient Account Number: [id number] Date of Birth/Sex: Treating RN: 07-13-1951 (72 y.o. [name] Primary Care Jyquan Kenley: Kerby Nora Other Clinician: Betha Loa Referring Judah Carchi: Treating Cloie Wooden/Extender: [name], [name] in Treatment: 18 Wound Status Wound Number: 3 Primary Diabetic Wound/Ulcer of the Lower Extremity Etiology: Wound Location: Right, Proximal, Dorsal Foot Wound Open Wounding Event: Gradually Appeared Status: Date Acquired: 12/10/2022 Comorbid Congestive Heart Failure, Coronary Artery Disease, Weeks Of Treatment: 18 History: Hypertension, Type II Diabetes, Neuropathy Clustered Wound: No Photos Wound  Measurements Length: (cm) 0.4 Width: (cm) 0.5 Depth: (cm) 0.2 Area: (cm) 0.157 Volume: (cm) 0.031 % Reduction in Area: 93.8% % Reduction in Volume: 87.6% Epithelialization: Small (1-33%) Wound Description Classification: Grade 1 Exudate Amount: Medium Exudate Type: Serosanguineous Exudate Color: red, brown Foul Odor After Cleansing: No Slough/Fibrino Yes Wound Bed Granulation Amount: Medium (34-66%) Exposed Structure Granulation Quality: Red Fascia Exposed: No Necrotic Amount: Medium (34-66%) Fat Layer (Subcutaneous Tissue) Exposed: Yes Necrotic Quality: Adherent Slough Tendon Exposed: No Muscle Exposed: No Joint Exposed: No Bone Exposed: No Treatment Notes Wound #3 (Foot) Wound Laterality: Dorsal, Right, Proximal Cleanser Soap and Water Discharge Instruction: Gently cleanse wound with antibacterial soap, rinse and pat dry prior to dressing wounds Peri-Wound Care [name], [name] ([id number]) [id number] Page 11 of 11 Topical Primary Dressing Prisma 4.34 (in) Discharge Instruction: Moisten w/normal saline or sterile water; Cover wound as directed. Do not remove from wound bed. Secondary Dressing Conforming Guaze Roll-Medium Discharge Instruction: Apply Conforming Stretch Guaze Bandage as directed Zetuvit Plus 4x4 (in/in) Secured With Compression Wrap Compression Stockings Facilities manager) Signed: 06/06/2023 5:02:18 PM By: Midge Aver MSN RN CNS WTA Signed: 06/06/2023 5:04:16 PM By: Betha Loa Entered By: Betha Loa on 06/06/2023 15:01:13 -------------------------------------------------------------------------------- Vitals Details Patient Name: Date of Service: [name] [name]. 06/06/2023 2:45 PM Medical Record Number: [id number] Patient Account Number: [id number] Date of Birth/Sex: Treating RN: 13-May-1951 (72 y.o. [name] Primary Care Saman Giddens: Kerby Nora Other Clinician: Betha Loa Referring  Rhyatt Muska: Treating Tiajuana Leppanen/Extender: [name], [name] in Treatment: 18 Vital Signs Time Taken: 14:50 Temperature (F): 97.8 Height (in): 66 Pulse (bpm): 83 Weight (lbs): 250 Respiratory Rate (breaths/min): 18 Body Mass Index (BMI): 40.3 Blood Pressure (mmHg): 130/77 Reference Range: 80 - 120 mg / dl Electronic Signature(s) Signed: 06/06/2023 5:04:16 PM By: Betha Loa Entered By: Betha Loa on 06/06/2023 14:52:03  Coronary Congestive Heart Failure, Coronary Comorbid History: Artery Disease, Hypertension, Type II Artery Disease, Hypertension, Type II Artery Disease, Hypertension, Type II Diabetes, Neuropathy Diabetes, Neuropathy Diabetes, Neuropathy 12/10/2022 12/10/2022 12/10/2022 Date Acquired: 18 18 18  Weeks of Treatment: Open Open Open Wound Status: No No No Wound Recurrence: Yes No No Clustered Wound: 1x0.4x0.1 1.5x1.5x0.2 0.4x0.5x0.2 Measurements L x W x D (cm) 0.314 1.767 0.157 A (cm) : rea 0.031 0.353 0.031 Volume (cm) : 92.70% -104.50% 93.80% % Reduction in A rea: 92.70% -310.50% 87.60% % Reduction in Volume: Grade 1 Grade 1 Grade 1 Classification: None Present None Present Medium Exudate A mount: N/A N/A Serosanguineous Exudate Type: N/A N/A red, brown Exudate Color: Medium (34-66%) Medium (34-66%) Medium (34-66%) Granulation A mount: Red Red, Pink Red Granulation Quality: None Present (0%) None Present (0%) Medium (34-66%) Necrotic A mountCLEDIS, SOHN ([id number]) [id number] Page 5 of 11 Fat Layer (Subcutaneous Tissue): Yes Fat Layer (Subcutaneous Tissue): Yes Fat Layer (Subcutaneous Tissue): Yes Exposed Structures: Fascia: No Fascia: No Fascia: No Tendon: No Tendon: No Tendon: No Muscle: No Muscle: No Muscle: No Joint:  No Joint: No Joint: No Bone: No Bone: No Bone: No Large (67-100%) Large (67-100%) Small (1-33%) Epithelialization: Treatment Notes Electronic Signature(s) Signed: 06/06/2023 5:04:16 PM By: Betha Loa Entered By: Betha Loa on 06/06/2023 15:01:51 -------------------------------------------------------------------------------- Multi-Disciplinary Care Plan Details Patient Name: Date of Service: [name] [name]. 06/06/2023 2:45 PM Medical Record Number: [id number] Patient Account Number: [id number] Date of Birth/Sex: Treating RN: Sep 23, 1950 (72 y.o. [name] Primary Care Jany Buckwalter: Kerby Nora Other Clinician: Betha Loa Referring Geralyn Figiel: Treating Ruth Tully/Extender: [name], [name] in Treatment: 11 Active Inactive Wound/Skin Impairment Nursing Diagnoses: Impaired tissue integrity Knowledge deficit related to ulceration/compromised skin integrity Goals: Patient/caregiver will verbalize understanding of skin care regimen Date Initiated: 02/01/2023 Date Inactivated: 02/28/2023 Target Resolution Date: 03/04/2023 Goal Status: Met Ulcer/skin breakdown will have a volume reduction of 30% by week 4 Date Initiated: 02/01/2023 Date Inactivated: 02/28/2023 Target Resolution Date: 03/04/2023 Goal Status: Met Ulcer/skin breakdown will have a volume reduction of 50% by week 8 Date Initiated: 02/01/2023 Date Inactivated: 04/11/2023 Target Resolution Date: 04/04/2023 Goal Status: Met Ulcer/skin breakdown will have a volume reduction of 80% by week 12 Date Initiated: 02/01/2023 Target Resolution Date: 07/05/2023 Goal Status: Active Interventions: Assess patient/caregiver ability to obtain necessary supplies Assess patient/caregiver ability to perform ulcer/skin care regimen upon admission and as needed Assess ulceration(s) every visit Provide education on ulcer and skin care Treatment Activities: Referred to DME Marisah Laker for dressing supplies : 01/31/2023 Skin care  regimen initiated : 01/31/2023 Notes: Electronic Signature(s) Signed: 06/06/2023 5:02:18 PM By: Midge Aver MSN RN CNS [address], [name] ([id number]) [id number] Page 6 of 11 Signed: 06/06/2023 5:04:16 PM By: Betha Loa Entered By: Betha Loa on 06/06/2023 15:25:31 -------------------------------------------------------------------------------- Pain Assessment Details Patient Name: Date of Service: [name] 06/06/2023 2:45 PM Medical Record Number: [id number] Patient Account Number: [id number] Date of Birth/Sex: Treating RN: 14-Sep-1950 (72 y.o. [name] Primary Care Lourene Hoston: Kerby Nora Other Clinician: Betha Loa Referring Ashleynicole Mcclees: Treating Keigan Tafoya/Extender: [name], [name] in Treatment: 18 Active Problems Location of Pain Severity and Description of Pain Patient Has Paino No Site Locations Pain Management and Medication Current Pain Management: Electronic Signature(s) Signed: 06/06/2023 5:02:18 PM By: Midge Aver MSN RN CNS WTA Signed: 06/06/2023 5:04:16 PM By: Betha Loa Entered By: Betha Loa on 06/06/2023 14:52:07 -------------------------------------------------------------------------------- Patient/Caregiver Education Details Patient Name: Date of Service: [name]

## 2023-06-06 NOTE — Progress Notes (Addendum)
and wrapping as opposed to using the Band-Aids. Electronic Signature(s) Signed: 06/06/2023 3:42:47 PM By: Allen Derry PA-C Entered By: Allen Derry on 06/06/2023 15:42:46 -------------------------------------------------------------------------------- Physical Exam Details Patient Name: Date of Service: [name] HN H. 06/06/2023 2:45 PM Medical Record Number: [id number] Patient Account Number: [id number] Date of Birth/Sex: Treating RN: September 07, 1950 (72 y.o. [name] Primary Care Provider: Kerby Nora Other Clinician:  Betha Loa Referring Provider: Treating Provider/Extender: [name], [name] in Treatment: 36 Constitutional Well-nourished and well-hydrated in no acute distress. Respiratory normal breathing without difficulty. Psychiatric this patient is able to make decisions and demonstrates good insight into disease process. Alert and Oriented x 3. pleasant and cooperative. Notes [name], [name] ([id number]) [id number] Page 3 of 8 Patient's wounds actually did have some slough and biofilm noted on the surface of the wound I was able to clean this away just with saline and gauze I did not have to perform any sharp debridement post mechanical debridement this looks to be doing much better which is great news and very pleased in that regard. Electronic Signature(s) Signed: 06/06/2023 3:43:03 PM By: Allen Derry PA-C Entered By: Allen Derry on 06/06/2023 15:43:01 -------------------------------------------------------------------------------- Physician Orders Details Patient Name: Date of Service: [name] HN H. 06/06/2023 2:45 PM Medical Record Number: [id number] Patient Account Number: [id number] Date of Birth/Sex: Treating RN: 1951-06-04 (72 y.o. [name] Primary Care Provider: Kerby Nora Other Clinician: Betha Loa Referring Provider: Treating Provider/Extender: [name], [name] in Treatment: 58 Verbal / Phone Orders: Yes Clinician: Midge Aver Read Back and Verified: Yes Diagnosis Coding ICD-10 Coding Code Description E11.621 Type 2 diabetes mellitus with foot ulcer L97.512 Non-pressure chronic ulcer of other part of right foot with fat layer exposed I25.10 Atherosclerotic heart disease of native coronary artery without angina pectoris I48.0 Paroxysmal atrial fibrillation Z79.01 Long term (current) use of anticoagulants I10 Essential (primary) hypertension Follow-up Appointments Return Appointment in 1  week. Bathing/ [employer] wounds with antibacterial soap and water. Anesthetic (Use 'Patient Medications' Section for Anesthetic Order Entry) Lidocaine applied to wound bed Wound Treatment Wound #1R - Calcaneus Wound Laterality: Right Cleanser: Soap and Water Every Other Day/30 Days Discharge Instructions: Gently cleanse wound with antibacterial soap, rinse and pat dry prior to dressing wounds Prim Dressing: Prisma 4.34 (in) Every Other Day/30 Days ary Discharge Instructions: Moisten w/normal saline or sterile water; Cover wound as directed. Do not remove from wound bed. Secondary Dressing: Conforming Guaze Roll-Medium Every Other Day/30 Days Discharge Instructions: Apply Conforming Stretch Guaze Bandage as directed Secondary Dressing: Zetuvit Plus 4x4 (in/in) Every Other Day/30 Days Wound #2R - Foot Wound Laterality: Dorsal, Right, Distal Cleanser: Soap and Water Every Other Day/30 Days Discharge Instructions: Gently cleanse wound with antibacterial soap, rinse and pat dry prior to dressing wounds Prim Dressing: Prisma 4.34 (in) Every Other Day/30 Days ary Discharge Instructions: Moisten w/normal saline or sterile water; Cover wound as directed. Do not remove from wound bed. Secondary Dressing: Conforming Guaze Roll-Medium Every Other Day/30 Days Discharge Instructions: Apply Conforming Stretch Guaze Bandage as directed PIERCE, BAROCIO ([id number]) [id number] Page 4 of 8 Secondary Dressing: Zetuvit Plus 4x4 (in/in) Every Other Day/30 Days Wound #3 - Foot Wound Laterality: Dorsal, Right, Proximal Cleanser: Soap and Water Every Other Day/30 Days Discharge Instructions: Gently cleanse wound with antibacterial soap, rinse and pat dry prior to dressing wounds Prim Dressing: Prisma 4.34 (in) Every Other Day/30 Days ary Discharge Instructions: Moisten w/normal saline or sterile water; Cover wound as  and wrapping as opposed to using the Band-Aids. Electronic Signature(s) Signed: 06/06/2023 3:42:47 PM By: Allen Derry PA-C Entered By: Allen Derry on 06/06/2023 15:42:46 -------------------------------------------------------------------------------- Physical Exam Details Patient Name: Date of Service: [name] HN H. 06/06/2023 2:45 PM Medical Record Number: [id number] Patient Account Number: [id number] Date of Birth/Sex: Treating RN: September 07, 1950 (72 y.o. [name] Primary Care Provider: Kerby Nora Other Clinician:  Betha Loa Referring Provider: Treating Provider/Extender: [name], [name] in Treatment: 36 Constitutional Well-nourished and well-hydrated in no acute distress. Respiratory normal breathing without difficulty. Psychiatric this patient is able to make decisions and demonstrates good insight into disease process. Alert and Oriented x 3. pleasant and cooperative. Notes [name], [name] ([id number]) [id number] Page 3 of 8 Patient's wounds actually did have some slough and biofilm noted on the surface of the wound I was able to clean this away just with saline and gauze I did not have to perform any sharp debridement post mechanical debridement this looks to be doing much better which is great news and very pleased in that regard. Electronic Signature(s) Signed: 06/06/2023 3:43:03 PM By: Allen Derry PA-C Entered By: Allen Derry on 06/06/2023 15:43:01 -------------------------------------------------------------------------------- Physician Orders Details Patient Name: Date of Service: [name] HN H. 06/06/2023 2:45 PM Medical Record Number: [id number] Patient Account Number: [id number] Date of Birth/Sex: Treating RN: 1951-06-04 (72 y.o. [name] Primary Care Provider: Kerby Nora Other Clinician: Betha Loa Referring Provider: Treating Provider/Extender: [name], [name] in Treatment: 58 Verbal / Phone Orders: Yes Clinician: Midge Aver Read Back and Verified: Yes Diagnosis Coding ICD-10 Coding Code Description E11.621 Type 2 diabetes mellitus with foot ulcer L97.512 Non-pressure chronic ulcer of other part of right foot with fat layer exposed I25.10 Atherosclerotic heart disease of native coronary artery without angina pectoris I48.0 Paroxysmal atrial fibrillation Z79.01 Long term (current) use of anticoagulants I10 Essential (primary) hypertension Follow-up Appointments Return Appointment in 1  week. Bathing/ [employer] wounds with antibacterial soap and water. Anesthetic (Use 'Patient Medications' Section for Anesthetic Order Entry) Lidocaine applied to wound bed Wound Treatment Wound #1R - Calcaneus Wound Laterality: Right Cleanser: Soap and Water Every Other Day/30 Days Discharge Instructions: Gently cleanse wound with antibacterial soap, rinse and pat dry prior to dressing wounds Prim Dressing: Prisma 4.34 (in) Every Other Day/30 Days ary Discharge Instructions: Moisten w/normal saline or sterile water; Cover wound as directed. Do not remove from wound bed. Secondary Dressing: Conforming Guaze Roll-Medium Every Other Day/30 Days Discharge Instructions: Apply Conforming Stretch Guaze Bandage as directed Secondary Dressing: Zetuvit Plus 4x4 (in/in) Every Other Day/30 Days Wound #2R - Foot Wound Laterality: Dorsal, Right, Distal Cleanser: Soap and Water Every Other Day/30 Days Discharge Instructions: Gently cleanse wound with antibacterial soap, rinse and pat dry prior to dressing wounds Prim Dressing: Prisma 4.34 (in) Every Other Day/30 Days ary Discharge Instructions: Moisten w/normal saline or sterile water; Cover wound as directed. Do not remove from wound bed. Secondary Dressing: Conforming Guaze Roll-Medium Every Other Day/30 Days Discharge Instructions: Apply Conforming Stretch Guaze Bandage as directed PIERCE, BAROCIO ([id number]) [id number] Page 4 of 8 Secondary Dressing: Zetuvit Plus 4x4 (in/in) Every Other Day/30 Days Wound #3 - Foot Wound Laterality: Dorsal, Right, Proximal Cleanser: Soap and Water Every Other Day/30 Days Discharge Instructions: Gently cleanse wound with antibacterial soap, rinse and pat dry prior to dressing wounds Prim Dressing: Prisma 4.34 (in) Every Other Day/30 Days ary Discharge Instructions: Moisten w/normal saline or sterile water; Cover wound as  [name], [name] ([id number]) [id number] Page 1 of 8 Visit Report for 06/06/2023 Chief Complaint Document Details Patient Name: Date of Service: [name] 06/06/2023 2:45 PM Medical Record Number: [id number] Patient Account Number: [id number] Date of Birth/Sex: Treating RN: [birth year] (72 y.o. [name] Primary Care Provider: Kerby Nora Other Clinician: Betha Loa Referring Provider: Treating Provider/Extender: [name], [name] in Treatment: 18 Information Obtained from: Patient Chief Complaint Right foot ulcers Electronic Signature(s) Signed: 06/06/2023 2:47:36 PM By: Allen Derry PA-C Entered By: Allen Derry on 06/06/2023 14:47:35 -------------------------------------------------------------------------------- HPI Details Patient Name: Date of Service: [name] HN H. 06/06/2023 2:45 PM Medical Record Number: [id number] Patient Account Number: [id number] Date of Birth/Sex: Treating RN: [birth year] (72 y.o. [name] Primary Care Provider: Kerby Nora Other Clinician: Betha Loa Referring Provider: Treating Provider/Extender: [name], [name] in Treatment: 18 History of Present Illness HPI Description: 01-31-2023 upon evaluation today patient appears to be doing poorly currently in regard to wounds on his right heel, right dorsal foot, and right lateral foot. Subsequently he does have known peripheral vascular disease and again this is something that he needs to I think Checked formally. This is what the majority of the conversation today hinged around and the patient voiced understanding as far as that is concerned. Fortunately I do not see any signs of active infection locally nor systemically which is great news. Patient does have a history of diabetes mellitus type 2, atrial fibrillation for which she is on long-term anticoagulant Therapy, hypertension, and coronary artery disease. Patient's  hemoglobin A1c most recently was on 11-17-2022 and was 7.2 and currently he is on Eliquis and Plavix. 02-07-2023 upon evaluation today patient appears to be doing well currently in regard to his wounds all things considered I feel like we are still maintaining that does not seem like it is any worse is also not significantly better. I discussed with the patient that I do believe he would benefit from the arterial evaluation we still need to get this done as quickly as possible and subsequently we did put in a follow-up call with the vascular office today with regard to this. 02-14-2023 upon evaluation today patient appears to be doing a little better in regard to his wounds which are showing signs of loosening which is great news. With that being said he is experiencing an improvement overall in his symptoms and we are still waiting on the vascular evaluation want to get this result and consider whether or not his blood flow is good or not we will be able to make a better determination of next steps. For now we will try and avoid any aggressive sharp debridement to know that he has good arterial flow. [name], [name] ([id number]) [id number] Page 2 of 8 02-21-2023 upon evaluation today patient appears to be doing about the same in regard to his wound. Fortunately there does not appear to be any signs of active infection at this time which is great news. No fevers, chills, nausea, vomiting, or diarrhea. I did review patient's arterial study and it appears that he has pretty good flow in the left is not perfect but it is decent. On the right however he is definitely not doing nearly as good and I think that he is going to need to see one of the vascular doctors for further evaluation and treatment of this right leg in order to get these wounds to heal. 02-28-2023 upon evaluation today patient appears to  and wrapping as opposed to using the Band-Aids. Electronic Signature(s) Signed: 06/06/2023 3:42:47 PM By: Allen Derry PA-C Entered By: Allen Derry on 06/06/2023 15:42:46 -------------------------------------------------------------------------------- Physical Exam Details Patient Name: Date of Service: [name] HN H. 06/06/2023 2:45 PM Medical Record Number: [id number] Patient Account Number: [id number] Date of Birth/Sex: Treating RN: September 07, 1950 (72 y.o. [name] Primary Care Provider: Kerby Nora Other Clinician:  Betha Loa Referring Provider: Treating Provider/Extender: [name], [name] in Treatment: 36 Constitutional Well-nourished and well-hydrated in no acute distress. Respiratory normal breathing without difficulty. Psychiatric this patient is able to make decisions and demonstrates good insight into disease process. Alert and Oriented x 3. pleasant and cooperative. Notes [name], [name] ([id number]) [id number] Page 3 of 8 Patient's wounds actually did have some slough and biofilm noted on the surface of the wound I was able to clean this away just with saline and gauze I did not have to perform any sharp debridement post mechanical debridement this looks to be doing much better which is great news and very pleased in that regard. Electronic Signature(s) Signed: 06/06/2023 3:43:03 PM By: Allen Derry PA-C Entered By: Allen Derry on 06/06/2023 15:43:01 -------------------------------------------------------------------------------- Physician Orders Details Patient Name: Date of Service: [name] HN H. 06/06/2023 2:45 PM Medical Record Number: [id number] Patient Account Number: [id number] Date of Birth/Sex: Treating RN: 1951-06-04 (72 y.o. [name] Primary Care Provider: Kerby Nora Other Clinician: Betha Loa Referring Provider: Treating Provider/Extender: [name], [name] in Treatment: 58 Verbal / Phone Orders: Yes Clinician: Midge Aver Read Back and Verified: Yes Diagnosis Coding ICD-10 Coding Code Description E11.621 Type 2 diabetes mellitus with foot ulcer L97.512 Non-pressure chronic ulcer of other part of right foot with fat layer exposed I25.10 Atherosclerotic heart disease of native coronary artery without angina pectoris I48.0 Paroxysmal atrial fibrillation Z79.01 Long term (current) use of anticoagulants I10 Essential (primary) hypertension Follow-up Appointments Return Appointment in 1  week. Bathing/ [employer] wounds with antibacterial soap and water. Anesthetic (Use 'Patient Medications' Section for Anesthetic Order Entry) Lidocaine applied to wound bed Wound Treatment Wound #1R - Calcaneus Wound Laterality: Right Cleanser: Soap and Water Every Other Day/30 Days Discharge Instructions: Gently cleanse wound with antibacterial soap, rinse and pat dry prior to dressing wounds Prim Dressing: Prisma 4.34 (in) Every Other Day/30 Days ary Discharge Instructions: Moisten w/normal saline or sterile water; Cover wound as directed. Do not remove from wound bed. Secondary Dressing: Conforming Guaze Roll-Medium Every Other Day/30 Days Discharge Instructions: Apply Conforming Stretch Guaze Bandage as directed Secondary Dressing: Zetuvit Plus 4x4 (in/in) Every Other Day/30 Days Wound #2R - Foot Wound Laterality: Dorsal, Right, Distal Cleanser: Soap and Water Every Other Day/30 Days Discharge Instructions: Gently cleanse wound with antibacterial soap, rinse and pat dry prior to dressing wounds Prim Dressing: Prisma 4.34 (in) Every Other Day/30 Days ary Discharge Instructions: Moisten w/normal saline or sterile water; Cover wound as directed. Do not remove from wound bed. Secondary Dressing: Conforming Guaze Roll-Medium Every Other Day/30 Days Discharge Instructions: Apply Conforming Stretch Guaze Bandage as directed PIERCE, BAROCIO ([id number]) [id number] Page 4 of 8 Secondary Dressing: Zetuvit Plus 4x4 (in/in) Every Other Day/30 Days Wound #3 - Foot Wound Laterality: Dorsal, Right, Proximal Cleanser: Soap and Water Every Other Day/30 Days Discharge Instructions: Gently cleanse wound with antibacterial soap, rinse and pat dry prior to dressing wounds Prim Dressing: Prisma 4.34 (in) Every Other Day/30 Days ary Discharge Instructions: Moisten w/normal saline or sterile water; Cover wound as  Medications' Section for Anesthetic Order Entry): Lidocaine applied to wound bed JANOS, SHAMPINE ([id number]) [phone] Page 7 of 8 WOUND #1R: - Calcaneus Wound Laterality: Right Cleanser: Soap and Water Every Other Day/30 Days Discharge Instructions: Gently cleanse wound with antibacterial soap, rinse and pat dry prior to dressing wounds Prim Dressing: Prisma 4.34 (in) Every Other Day/30  Days ary Discharge Instructions: Moisten w/normal saline or sterile water; Cover wound as directed. Do not remove from wound bed. Secondary Dressing: Conforming Guaze Roll-Medium Every Other Day/30 Days Discharge Instructions: Apply Conforming Stretch Guaze Bandage as directed Secondary Dressing: Zetuvit Plus 4x4 (in/in) Every Other Day/30 Days WOUND #2R: - Foot Wound Laterality: Dorsal, Right, Distal Cleanser: Soap and Water Every Other Day/30 Days Discharge Instructions: Gently cleanse wound with antibacterial soap, rinse and pat dry prior to dressing wounds Prim Dressing: Prisma 4.34 (in) Every Other Day/30 Days ary Discharge Instructions: Moisten w/normal saline or sterile water; Cover wound as directed. Do not remove from wound bed. Secondary Dressing: Conforming Guaze Roll-Medium Every Other Day/30 Days Discharge Instructions: Apply Conforming Stretch Guaze Bandage as directed Secondary Dressing: Zetuvit Plus 4x4 (in/in) Every Other Day/30 Days WOUND #3: - Foot Wound Laterality: Dorsal, Right, Proximal Cleanser: Soap and Water Every Other Day/30 Days Discharge Instructions: Gently cleanse wound with antibacterial soap, rinse and pat dry prior to dressing wounds Prim Dressing: Prisma 4.34 (in) Every Other Day/30 Days ary Discharge Instructions: Moisten w/normal saline or sterile water; Cover wound as directed. Do not remove from wound bed. Secondary Dressing: Conforming Guaze Roll-Medium Every Other Day/30 Days Discharge Instructions: Apply Conforming Stretch Guaze Bandage as directed Secondary Dressing: Zetuvit Plus 4x4 (in/in) Every Other Day/30 Days 1. I would recommend that we have the patient continue to monitor for any signs of infection or worsening. Based on what I am seeing I do believe that we are making good headway towards complete closure and very pleased with what we see and I do believe that he is making good progress here. 2. I am going to recommend we continue with the  Prisma we will use Zetuvit to cover the roll gauze and secure in place that should keep it from being too wet around the edges of the wounds which I think is what is causing the irritation. We will see patient back for reevaluation in 1 week here in the clinic. If anything worsens or changes patient will contact our office for additional recommendations. Electronic Signature(s) Signed: 06/06/2023 3:43:57 PM By: Allen Derry PA-C Entered By: Allen Derry on 06/06/2023 15:43:57 -------------------------------------------------------------------------------- SuperBill Details Patient Name: Date of Service: [name] HN H. 06/06/2023 Medical Record Number: [id number] Patient Account Number: [id number] Date of Birth/Sex: Treating RN: [birth year] (72 y.o. [name] Primary Care Provider: Kerby Nora Other Clinician: Betha Loa Referring Provider: Treating Provider/Extender: [name], [name] in Treatment: 18 Diagnosis Coding ICD-10 Codes Code Description E11.621 Type 2 diabetes mellitus with foot ulcer L97.512 Non-pressure chronic ulcer of other part of right foot with fat layer exposed I25.10 Atherosclerotic heart disease of native coronary artery without angina pectoris I48.0 Paroxysmal atrial fibrillation Z79.01 Long term (current) use of anticoagulants I10 Essential (primary) hypertension Facility Procedures : MANNIX, KROEKER Code: [id number] [facility] ([id number]) Description: 99214 - WOUND CARE VISIT-LEV 4 EST PT [phone] Modifier: [id number] Page Quantity: 1 8 of 8 Physician Procedures : CPT4 Code Description Modifier [id number] 99213 - WC PHYS LEVEL 3 - EST PT ICD-10 Diagnosis Description E11.621 Type 2 diabetes mellitus with foot ulcer L97.512 Non-pressure  and wrapping as opposed to using the Band-Aids. Electronic Signature(s) Signed: 06/06/2023 3:42:47 PM By: Allen Derry PA-C Entered By: Allen Derry on 06/06/2023 15:42:46 -------------------------------------------------------------------------------- Physical Exam Details Patient Name: Date of Service: [name] HN H. 06/06/2023 2:45 PM Medical Record Number: [id number] Patient Account Number: [id number] Date of Birth/Sex: Treating RN: September 07, 1950 (72 y.o. [name] Primary Care Provider: Kerby Nora Other Clinician:  Betha Loa Referring Provider: Treating Provider/Extender: [name], [name] in Treatment: 36 Constitutional Well-nourished and well-hydrated in no acute distress. Respiratory normal breathing without difficulty. Psychiatric this patient is able to make decisions and demonstrates good insight into disease process. Alert and Oriented x 3. pleasant and cooperative. Notes [name], [name] ([id number]) [id number] Page 3 of 8 Patient's wounds actually did have some slough and biofilm noted on the surface of the wound I was able to clean this away just with saline and gauze I did not have to perform any sharp debridement post mechanical debridement this looks to be doing much better which is great news and very pleased in that regard. Electronic Signature(s) Signed: 06/06/2023 3:43:03 PM By: Allen Derry PA-C Entered By: Allen Derry on 06/06/2023 15:43:01 -------------------------------------------------------------------------------- Physician Orders Details Patient Name: Date of Service: [name] HN H. 06/06/2023 2:45 PM Medical Record Number: [id number] Patient Account Number: [id number] Date of Birth/Sex: Treating RN: 1951-06-04 (72 y.o. [name] Primary Care Provider: Kerby Nora Other Clinician: Betha Loa Referring Provider: Treating Provider/Extender: [name], [name] in Treatment: 58 Verbal / Phone Orders: Yes Clinician: Midge Aver Read Back and Verified: Yes Diagnosis Coding ICD-10 Coding Code Description E11.621 Type 2 diabetes mellitus with foot ulcer L97.512 Non-pressure chronic ulcer of other part of right foot with fat layer exposed I25.10 Atherosclerotic heart disease of native coronary artery without angina pectoris I48.0 Paroxysmal atrial fibrillation Z79.01 Long term (current) use of anticoagulants I10 Essential (primary) hypertension Follow-up Appointments Return Appointment in 1  week. Bathing/ [employer] wounds with antibacterial soap and water. Anesthetic (Use 'Patient Medications' Section for Anesthetic Order Entry) Lidocaine applied to wound bed Wound Treatment Wound #1R - Calcaneus Wound Laterality: Right Cleanser: Soap and Water Every Other Day/30 Days Discharge Instructions: Gently cleanse wound with antibacterial soap, rinse and pat dry prior to dressing wounds Prim Dressing: Prisma 4.34 (in) Every Other Day/30 Days ary Discharge Instructions: Moisten w/normal saline or sterile water; Cover wound as directed. Do not remove from wound bed. Secondary Dressing: Conforming Guaze Roll-Medium Every Other Day/30 Days Discharge Instructions: Apply Conforming Stretch Guaze Bandage as directed Secondary Dressing: Zetuvit Plus 4x4 (in/in) Every Other Day/30 Days Wound #2R - Foot Wound Laterality: Dorsal, Right, Distal Cleanser: Soap and Water Every Other Day/30 Days Discharge Instructions: Gently cleanse wound with antibacterial soap, rinse and pat dry prior to dressing wounds Prim Dressing: Prisma 4.34 (in) Every Other Day/30 Days ary Discharge Instructions: Moisten w/normal saline or sterile water; Cover wound as directed. Do not remove from wound bed. Secondary Dressing: Conforming Guaze Roll-Medium Every Other Day/30 Days Discharge Instructions: Apply Conforming Stretch Guaze Bandage as directed PIERCE, BAROCIO ([id number]) [id number] Page 4 of 8 Secondary Dressing: Zetuvit Plus 4x4 (in/in) Every Other Day/30 Days Wound #3 - Foot Wound Laterality: Dorsal, Right, Proximal Cleanser: Soap and Water Every Other Day/30 Days Discharge Instructions: Gently cleanse wound with antibacterial soap, rinse and pat dry prior to dressing wounds Prim Dressing: Prisma 4.34 (in) Every Other Day/30 Days ary Discharge Instructions: Moisten w/normal saline or sterile water; Cover wound as  [name], [name] ([id number]) [id number] Page 1 of 8 Visit Report for 06/06/2023 Chief Complaint Document Details Patient Name: Date of Service: [name] 06/06/2023 2:45 PM Medical Record Number: [id number] Patient Account Number: [id number] Date of Birth/Sex: Treating RN: [birth year] (72 y.o. [name] Primary Care Provider: Kerby Nora Other Clinician: Betha Loa Referring Provider: Treating Provider/Extender: [name], [name] in Treatment: 18 Information Obtained from: Patient Chief Complaint Right foot ulcers Electronic Signature(s) Signed: 06/06/2023 2:47:36 PM By: Allen Derry PA-C Entered By: Allen Derry on 06/06/2023 14:47:35 -------------------------------------------------------------------------------- HPI Details Patient Name: Date of Service: [name] HN H. 06/06/2023 2:45 PM Medical Record Number: [id number] Patient Account Number: [id number] Date of Birth/Sex: Treating RN: [birth year] (72 y.o. [name] Primary Care Provider: Kerby Nora Other Clinician: Betha Loa Referring Provider: Treating Provider/Extender: [name], [name] in Treatment: 18 History of Present Illness HPI Description: 01-31-2023 upon evaluation today patient appears to be doing poorly currently in regard to wounds on his right heel, right dorsal foot, and right lateral foot. Subsequently he does have known peripheral vascular disease and again this is something that he needs to I think Checked formally. This is what the majority of the conversation today hinged around and the patient voiced understanding as far as that is concerned. Fortunately I do not see any signs of active infection locally nor systemically which is great news. Patient does have a history of diabetes mellitus type 2, atrial fibrillation for which she is on long-term anticoagulant Therapy, hypertension, and coronary artery disease. Patient's  hemoglobin A1c most recently was on 11-17-2022 and was 7.2 and currently he is on Eliquis and Plavix. 02-07-2023 upon evaluation today patient appears to be doing well currently in regard to his wounds all things considered I feel like we are still maintaining that does not seem like it is any worse is also not significantly better. I discussed with the patient that I do believe he would benefit from the arterial evaluation we still need to get this done as quickly as possible and subsequently we did put in a follow-up call with the vascular office today with regard to this. 02-14-2023 upon evaluation today patient appears to be doing a little better in regard to his wounds which are showing signs of loosening which is great news. With that being said he is experiencing an improvement overall in his symptoms and we are still waiting on the vascular evaluation want to get this result and consider whether or not his blood flow is good or not we will be able to make a better determination of next steps. For now we will try and avoid any aggressive sharp debridement to know that he has good arterial flow. [name], [name] ([id number]) [id number] Page 2 of 8 02-21-2023 upon evaluation today patient appears to be doing about the same in regard to his wound. Fortunately there does not appear to be any signs of active infection at this time which is great news. No fevers, chills, nausea, vomiting, or diarrhea. I did review patient's arterial study and it appears that he has pretty good flow in the left is not perfect but it is decent. On the right however he is definitely not doing nearly as good and I think that he is going to need to see one of the vascular doctors for further evaluation and treatment of this right leg in order to get these wounds to heal. 02-28-2023 upon evaluation today patient appears to  and wrapping as opposed to using the Band-Aids. Electronic Signature(s) Signed: 06/06/2023 3:42:47 PM By: Allen Derry PA-C Entered By: Allen Derry on 06/06/2023 15:42:46 -------------------------------------------------------------------------------- Physical Exam Details Patient Name: Date of Service: [name] HN H. 06/06/2023 2:45 PM Medical Record Number: [id number] Patient Account Number: [id number] Date of Birth/Sex: Treating RN: September 07, 1950 (72 y.o. [name] Primary Care Provider: Kerby Nora Other Clinician:  Betha Loa Referring Provider: Treating Provider/Extender: [name], [name] in Treatment: 36 Constitutional Well-nourished and well-hydrated in no acute distress. Respiratory normal breathing without difficulty. Psychiatric this patient is able to make decisions and demonstrates good insight into disease process. Alert and Oriented x 3. pleasant and cooperative. Notes [name], [name] ([id number]) [id number] Page 3 of 8 Patient's wounds actually did have some slough and biofilm noted on the surface of the wound I was able to clean this away just with saline and gauze I did not have to perform any sharp debridement post mechanical debridement this looks to be doing much better which is great news and very pleased in that regard. Electronic Signature(s) Signed: 06/06/2023 3:43:03 PM By: Allen Derry PA-C Entered By: Allen Derry on 06/06/2023 15:43:01 -------------------------------------------------------------------------------- Physician Orders Details Patient Name: Date of Service: [name] HN H. 06/06/2023 2:45 PM Medical Record Number: [id number] Patient Account Number: [id number] Date of Birth/Sex: Treating RN: 1951-06-04 (72 y.o. [name] Primary Care Provider: Kerby Nora Other Clinician: Betha Loa Referring Provider: Treating Provider/Extender: [name], [name] in Treatment: 58 Verbal / Phone Orders: Yes Clinician: Midge Aver Read Back and Verified: Yes Diagnosis Coding ICD-10 Coding Code Description E11.621 Type 2 diabetes mellitus with foot ulcer L97.512 Non-pressure chronic ulcer of other part of right foot with fat layer exposed I25.10 Atherosclerotic heart disease of native coronary artery without angina pectoris I48.0 Paroxysmal atrial fibrillation Z79.01 Long term (current) use of anticoagulants I10 Essential (primary) hypertension Follow-up Appointments Return Appointment in 1  week. Bathing/ [employer] wounds with antibacterial soap and water. Anesthetic (Use 'Patient Medications' Section for Anesthetic Order Entry) Lidocaine applied to wound bed Wound Treatment Wound #1R - Calcaneus Wound Laterality: Right Cleanser: Soap and Water Every Other Day/30 Days Discharge Instructions: Gently cleanse wound with antibacterial soap, rinse and pat dry prior to dressing wounds Prim Dressing: Prisma 4.34 (in) Every Other Day/30 Days ary Discharge Instructions: Moisten w/normal saline or sterile water; Cover wound as directed. Do not remove from wound bed. Secondary Dressing: Conforming Guaze Roll-Medium Every Other Day/30 Days Discharge Instructions: Apply Conforming Stretch Guaze Bandage as directed Secondary Dressing: Zetuvit Plus 4x4 (in/in) Every Other Day/30 Days Wound #2R - Foot Wound Laterality: Dorsal, Right, Distal Cleanser: Soap and Water Every Other Day/30 Days Discharge Instructions: Gently cleanse wound with antibacterial soap, rinse and pat dry prior to dressing wounds Prim Dressing: Prisma 4.34 (in) Every Other Day/30 Days ary Discharge Instructions: Moisten w/normal saline or sterile water; Cover wound as directed. Do not remove from wound bed. Secondary Dressing: Conforming Guaze Roll-Medium Every Other Day/30 Days Discharge Instructions: Apply Conforming Stretch Guaze Bandage as directed PIERCE, BAROCIO ([id number]) [id number] Page 4 of 8 Secondary Dressing: Zetuvit Plus 4x4 (in/in) Every Other Day/30 Days Wound #3 - Foot Wound Laterality: Dorsal, Right, Proximal Cleanser: Soap and Water Every Other Day/30 Days Discharge Instructions: Gently cleanse wound with antibacterial soap, rinse and pat dry prior to dressing wounds Prim Dressing: Prisma 4.34 (in) Every Other Day/30 Days ary Discharge Instructions: Moisten w/normal saline or sterile water; Cover wound as

## 2023-06-07 NOTE — Progress Notes (Deleted)
PCP: [name], MD (last seen 06/24) Primary Cardiologist: Julien Nordmann, MD (last seen 06/24)  HPI:  [name] is a 72 y/o male with a history of NSTEMI 04/17 (PCI to LAD; was discharged on Plavix) and again 02/2016 (no PCI; was discharged on Brilinta), Again presented to [facility] ER on 04/05/16 for NSTEMI (no PCI), chronic total occlusion of RCA, DM, hyperlipidemia, HTN, CKD, depression and chronic heart failure. Due to paroxysmal atrial fibrillation, was successfully cardioverted 03/03/23.   Echo 11/06/22: EF 40-45% along with mild/moderate MR  LHC 04/06/16:  Mid RCA to Dist RCA lesion, 100 %stenosed. Mid LAD-2 lesion, 60 %stenosed. Mid LAD-1 lesion, 20 %stenosed. The left ventricular systolic function is normal. The left ventricular ejection fraction is 50-55% by visual estimate. LV end diastolic pressure is normal. 2nd [name] lesion, 75 %stenosed.  Chronic total occlusion of the right coronary in the proximal segment well collateralized from the left circumflex and LAD. Widely patent proximal to mid LAD stent previously placed in July. There is first diagonal diagonal and LAD 30 and 50% narrowing respectively. Widely patent circumflex unchanged from previous with 70% narrowing in a small branch of the first marginal. Circumflex collateralizes the distal right coronary left ventricular branch. Inferobasal hypokinesis. EF 50%. EDP is normal.  Admitted 11/05/22 due to nausea, vomiting and dyspnea for the last 3 days. Noted to have fever and tested covid +. CTA negative for PE but showed heart failure. 1 dose of IV lasix given. Started on remdesivir, Decadron and bronchodilators.   He presents today for a HF f/u visit with a chief complaint of minimal fatigue with moderate exertion. Chronic in nature although much improved. Has associated pain in his right foot and wound on his right foot. Does endorse that prior to his aortogram last month, he wasn't having any pain in the foot. & understands that his  pain is now because he has good blood flow in that leg. He can now walk without his cane and is overall feeling better. Denies any SOB, chest pain, palpitations, abdominal distention, pedal edema, dizziness or difficulty sleeping.    Cardioverted 03/03/23. Had right lower leg aortogram and angioplasty of right posterior tibial artery and right popliteal artery on 03/27/23.  Participating in paramedicine program and was last seen by her 07/24.   ROS: All systems negative except as listed in HPI, PMH and Problem List.  SH:  Social History   Socioeconomic History   Marital status: Widowed    Spouse name: Not on file   Number of children: 3   Years of education: Not on file   Highest education level: 7th grade  Occupational History   Occupation: disabled    Associate Professor: UNEMPLOYED    Comment: back injury  Tobacco Use   Smoking status: Never   Smokeless tobacco: Never  Vaping Use   Vaping status: Never Used  Substance and Sexual Activity   Alcohol use: No    Alcohol/week: 0.0 standard drinks of alcohol   Drug use: No   Sexual activity: Not Currently  Other Topics Concern   Not on file  Social History Narrative   Has a roommate, Mr. [name]. No pets.   Social Determinants of Health   Financial Resource Strain: Low Risk  (11/23/2022)   Overall Financial Resource Strain (CARDIA)    Difficulty of Paying Living Expenses: Not hard at all  Food Insecurity: No Food Insecurity (12/29/2022)   Hunger Vital Sign    Worried About Programme researcher, broadcasting/film/video in  the Last Year: Never true    Ran Out of Food in the Last Year: Never true  Transportation Needs: No Transportation Needs (12/29/2022)   PRAPARE - Administrator, Civil Service (Medical): No    Lack of Transportation (Non-Medical): No  Physical Activity: Inactive (11/09/2022)   Exercise Vital Sign    Days of Exercise per Week: 0 days    Minutes of Exercise per Session: 0 min  Stress: No Stress Concern Present (11/09/2022)   [employer] of Occupational Health - Occupational Stress Questionnaire    Feeling of Stress : Only a little  Social Connections: Moderately Isolated (11/09/2022)   Social Connection and Isolation Panel [NHANES]    Frequency of Communication with Friends and Family: More than three times a week    Frequency of Social Gatherings with Friends and Family: More than three times a week    Attends Religious Services: More than 4 times per year    Active Member of [employer] or Organizations: No    Attends Banker Meetings: Never    Marital Status: Widowed  Intimate Partner Violence: Not At Risk (11/09/2022)   Humiliation, Afraid, Rape, and Kick questionnaire    Fear of Current or Ex-Partner: No    Emotionally Abused: No    Physically Abused: No    Sexually Abused: No    FH:  Family History  Problem Relation Age of Onset   Alzheimer's disease Mother    Emphysema Mother    Diabetes Father    Heart disease Father        MI   Cancer Brother        ? Neck cancer    Past Medical History:  Diagnosis Date   Back injury 02/2002   worker's comp   CHF (congestive heart failure) (HCC)    Coronary artery disease, non-occlusive    a. cath 2002 with no sig CAD;  b. cath 2008 normal LM, LAD, LCx, p&dRCA 20-30%, PDA 30%; c.11/2015 NSTEMI/PCI: LM nl, LAD 95p (2.5x15 Xience DES), LCX nl, RCA 100p/m w/ L->R collats, EF 55-65% c. NSTEMI (02/2016) with no culprit leision, switched to Brilinta.  d. NSTEMI 03/2016: again, no culprit lesion and switched back to plavix 2/2 SOB with Brilnta.     Depression    Diabetes mellitus type 2, insulin dependent (HCC)    Hyperlipemia    Hypertension    Hypertensive heart disease    Kidney stones    Macular degeneration    Morbid obesity (HCC)    Osteoarthritis    Snoring     Current Outpatient Medications  Medication Sig Dispense Refill   Acetaminophen (TYLENOL PO) Take 3-4 tablets by mouth as needed (pain).     apixaban (ELIQUIS) 5 MG TABS tablet Take 1  tablet (5 mg total) by mouth 2 (two) times daily. 56 tablet 0   carvedilol (COREG) 6.25 MG tablet Take 1 tablet (6.25 mg total) by mouth 2 (two) times daily with a meal. 90 tablet 3   cholecalciferol (VITAMIN D3) 25 MCG (1000 UNIT) tablet Take 2,000 Units by mouth daily.     clopidogrel (PLAVIX) 75 MG tablet TAKE ONE TABLET BY MOUTH ONCE DAILY WITH BREAKFAST 90 tablet 3   Continuous Blood Gluc Sensor (FREESTYLE LIBRE 2 SENSOR) MISC APPLY SENSOR EVERY 14 DAYS TO MONITOR SUGAR CONTINOUSLY (Patient not taking: Reported on 05/30/2023) 2 each 11   Continuous Glucose Receiver (FREESTYLE LIBRE 2 READER) DEVI USE WITH SENSORS TO MONITOR SUGAR CONTINUOUSLY 1  each 0   Dulaglutide (TRULICITY) 4.5 MG/0.5ML SOPN Inject 4.5 mg into the skin once a week. Via [employer] PAP     ENTRESTO 24-26 MG TAKE ONE TABLET BY MOUTH TWO (TWO) TIMES DAILY. 60 tablet 3   ezetimibe (ZETIA) 10 MG tablet Take 1 tablet (10 mg total) by mouth daily. 90 tablet 3   furosemide (LASIX) 40 MG tablet Take 40 mg by mouth daily.     gabapentin (NEURONTIN) 100 MG capsule Take 1 capsule (100 mg total) by mouth at bedtime. 30 capsule 3   HYDROcodone-acetaminophen (NORCO/VICODIN) 5-325 MG tablet Take 1-2 tablets by mouth daily as needed for moderate pain. 60 tablet 0   hydrOXYzine (ATARAX) 10 MG tablet TAKE 1 TABLET BY MOUTH 3 TIMES DAILY AS NEEDED FOR ANXIETY 30 tablet 2   insulin aspart (NOVOLOG FLEXPEN) 100 UNIT/ML FlexPen Inject 13 Units into the skin every morning.     isosorbide mononitrate (IMDUR) 30 MG 24 hr tablet Take 1 tablet (30 mg total) by mouth 2 (two) times daily. 60 tablet 2   JARDIANCE 10 MG TABS tablet TAKE ONE TABLET (10 MG TOTAL) BY MOUTH DAILY. 30 tablet 5   nitroGLYCERIN (NITROSTAT) 0.4 MG SL tablet DISSOLVE 1 TABLET UNDER TONGUE AS NEEDEDFOR CHEST PAIN. MAY REPEAT 5 MINUTES APART 3 TIMES IF NEEDED 25 tablet 3   spironolactone (ALDACTONE) 25 MG tablet TAKE ONE TABLET (25 MG TOTAL) BY MOUTH DAILY. 30 tablet 5   TRESIBA  FLEXTOUCH 100 UNIT/ML FlexTouch Pen INJECT 50 UNITS INTO THE SKIN DAILY (Patient not taking: Reported on 05/30/2023) 15 mL 2   TRUEPLUS 5-BEVEL PEN NEEDLES 31G X 6 MM MISC USE TO INJECT INSULIN THREE TIMES A DAY (Patient not taking: Reported on 05/30/2023) 300 each 3   venlafaxine XR (EFFEXOR-XR) 150 MG 24 hr capsule TAKE ONE CAPSULE BY MOUTH DAILY WITH BREAKFAST. TAKE WITH EFFEXOR XR 75MG  FOR A TOTAL OF 225MG  90 capsule 0   venlafaxine XR (EFFEXOR-XR) 75 MG 24 hr capsule TAKE ONE CAPSULE BY MOUTH ONCE DAILY. TAKE IN ADDITION TO THE 150 MG CAPSULE FOR A TOTAL DOSE OF 225MG  DAILY 90 capsule 0   vitamin B-12 (CYANOCOBALAMIN) 1000 MCG tablet Take 1,000 mcg by mouth daily.     No current facility-administered medications for this visit.   There were no vitals filed for this visit.  Wt Readings from Last 3 Encounters:  06/01/23 252 lb (114.3 kg)  05/30/23 256 lb 6 oz (116.3 kg)  05/25/23 253 lb 6 oz (114.9 kg)   Lab Results  Component Value Date   CREATININE 1.15 05/18/2023   CREATININE 1.16 05/09/2023   CREATININE 0.99 03/27/2023   PHYSICAL EXAM:  General:  Well appearing. No resp difficulty HEENT: normal Neck: supple. JVP flat. No lymphadenopathy or thryomegaly appreciated. Cor: PMI normal. Regular rate, regular rhythm. No rubs, gallops or murmurs. Lungs: clear Abdomen: soft, nontender, nondistended. No hepatosplenomegaly. No bruits or masses.  Extremities: no cyanosis, clubbing, rash, trace pitting edema right lower leg Neuro: alert & oriented x3, cranial nerves grossly intact. Moves all 4 extremities w/o difficulty. Affect pleasant.   ECG: not done   ASSESSMENT & PLAN:  1: Ischemic cardiomyopathy with reduced ejection fraction- - NYHA class II - euvolemic today - weighing daily; reminded to call for an overnight weight gain of > 2 pounds or a weekly weight gain of > 5 pounds - weight up 2 pounds from last visit here 2 months ago - echo 11/06/22: EF 40-45% along with  mild/moderate MR - NAS but does like to eat out at [employer]; eating out less often now than he used to and we reviewed how to make better sodium choices when he eats out - discussed keeping his daily fluid intake to 60-64 oz - continue carvedilol 6.25mg  BID - continue jardiance 10mg  daily - continue furosemide 40mg  daily - continue entresto 24/26mg  BID; consider titrating in the future although BP does fluctuate quite a bit - continue spironolactone 25mg  daily - participating in paramedicine program; last seen 07/24 - more active at home now and reports overall feeling better - BNP 11/05/22 was 1240.5  2: HTN- - BP 126/66 - saw PCP Ermalene Searing) 06/24 - BMP 02/28/23 showed sodium 138, potassium 4.2, creatinine 1.01 & GFR >60  3: Diabetes- - A1c 02/22/23 was 6.6%  4: CAD- - saw cardiology Mariah Milling) 06/24 - LHC 04/06/16:  Mid RCA to Dist RCA lesion, 100 %stenosed. Mid LAD-2 lesion, 60 %stenosed. Mid LAD-1 lesion, 20 %stenosed. The left ventricular systolic function is normal. The left ventricular ejection fraction is 50-55% by visual estimate. LV end diastolic pressure is normal. 2nd [name] lesion, 75 %stenosed.  Chronic total occlusion of the right coronary in the proximal segment well collateralized from the left circumflex and LAD.  5: Atrial fibrillation- - cardioverted 03/03/23 - to see cardiology in 2 days so will defer EKG to them  6: PAD- - saw vascular Manson Passey) 07/24 - goes to the wound center - had right lower leg aortogram and angioplasty of right posterior tibial artery and right popliteal artery on 03/27/23 - says that his right foot now hurts at times and he understands it's because of the blood flow now  Return in 2-3 months, sooner if needed.

## 2023-06-08 ENCOUNTER — Ambulatory Visit (INDEPENDENT_AMBULATORY_CARE_PROVIDER_SITE_OTHER): Payer: PPO | Admitting: Family Medicine

## 2023-06-08 ENCOUNTER — Encounter: Payer: Self-pay | Admitting: Family Medicine

## 2023-06-08 VITALS — BP 114/72 | HR 80 | Temp 97.7°F | Ht 66.0 in | Wt 253.6 lb

## 2023-06-08 DIAGNOSIS — Z789 Other specified health status: Secondary | ICD-10-CM | POA: Diagnosis not present

## 2023-06-08 DIAGNOSIS — G8929 Other chronic pain: Secondary | ICD-10-CM | POA: Diagnosis not present

## 2023-06-08 DIAGNOSIS — M545 Low back pain, unspecified: Secondary | ICD-10-CM

## 2023-06-08 MED ORDER — HYDROCODONE-ACETAMINOPHEN 5-325 MG PO TABS: 1.0000 | ORAL_TABLET | Freq: Three times a day (TID) | ORAL | 0 refills | Status: DC | PRN

## 2023-06-08 NOTE — Progress Notes (Signed)
Patient ID: RISHAD BOULDEN, male    DOB: 11-17-1950, 72 y.o.   MRN: [id number]  This visit was conducted in person.  BP 114/72   Pulse 80   Temp 97.7 F (36.5 C) (Oral)   Ht 5\' 6"  (1.676 m)   Wt 253 lb 9.6 oz (115 kg)   SpO2 99%   BMI 40.93 kg/m    CC:  Chief Complaint  Patient presents with   Pain    Back pain. Does not feel mediation is working     Subjective:   HPI: DUTCH PIECH is a 72 y.o. male presenting on 06/08/2023 for Pain (Back pain. Does not feel mediation is working )  AND MOBILITY REASSESSMENT  At last office visit May 25, 2023 seen for chronic low back pain He had mentioned worsening pain in his low back requiring  hydrocodone acetaminophen 5/325 mg 1  tablets 3 times daily... now out of pain meds as having to take med every day TID.l Started gabapentin 100 mg at bedtime for pain... minima; help and cause him to be tired.  Today he reports that he does not feel like the gabapentin is helping.  Pain is across low back, occ radiates down right leg.  Bilateral leg pain is better since PAD treatment.  Legs feeling stronger now.   Numbness in both feet.   Pain 10/10 today.. given  tried to clean house.  Using cane.   No recent MRI.   Original injury at work 2012 in  lifting 80 lbs and fell back... went through workers comp.  He states that he saw surgery at the time  Pain gradually worsened since hydrocodone BID no longer helping.  He tried some of his brother's  oxycodone 10/ 325 mg  BID and he felt much better.. no pain and was able to walk and function easier.  No recent MRI.  Last Xray 2022 reviewed.. Degenerative disease with disc height loss throughout the lumbar spine most severe at L3-4 and L4-5. Bilateral facet arthropathy of the lumbar spine.      PMD is necessary to perform ADLs at home including getting to bathroom to toilet, getting to kitchen to eat and cooking, getting to bedroom to dress etc.  He is out of balance using a cane.   Legs given put on him after 8 feet of walking. Can only stand 30 seconds with leaning on anything. He has fallen multiple times given leg weakness and balance issues.   He cannot use a cane/walker given his poor balance and a leg weakness.  His finger numbness  and cramping that makes it difficult propel a manual wheelchair. Also chronic low back pain limits his ablity to maneuver a manual wheel chair. A scooter would be able to fit in his house  He can operate a scooter safely, both mentally and physically.  He is willing and motivated to use the device.Marland Kitchen He would uses it daily during all waking hours.  Relevant past medical, surgical, family and social history reviewed and updated as indicated. Interim medical history since our last visit reviewed. Allergies and medications reviewed and updated. Outpatient Medications Prior to Visit  Medication Sig Dispense Refill   Acetaminophen (TYLENOL PO) Take 3-4 tablets by mouth as needed (pain).     apixaban (ELIQUIS) 5 MG TABS tablet Take 1 tablet (5 mg total) by mouth 2 (two) times daily. 56 tablet 0   carvedilol (COREG) 6.25 MG tablet Take 1 tablet (6.25 mg total) by  mouth 2 (two) times daily with a meal. 90 tablet 3   cholecalciferol (VITAMIN D3) 25 MCG (1000 UNIT) tablet Take 2,000 Units by mouth daily.     clopidogrel (PLAVIX) 75 MG tablet TAKE ONE TABLET BY MOUTH ONCE DAILY WITH BREAKFAST 90 tablet 3   Continuous Blood Gluc Sensor (FREESTYLE LIBRE 2 SENSOR) MISC APPLY SENSOR EVERY 14 DAYS TO MONITOR SUGAR CONTINOUSLY 2 each 11   Continuous Glucose Receiver (FREESTYLE LIBRE 2 READER) DEVI USE WITH SENSORS TO MONITOR SUGAR CONTINUOUSLY 1 each 0   Dulaglutide (TRULICITY) 4.5 MG/0.5ML SOPN Inject 4.5 mg into the skin once a week. Via [employer] PAP     ENTRESTO 24-26 MG TAKE ONE TABLET BY MOUTH TWO (TWO) TIMES DAILY. 60 tablet 3   ezetimibe (ZETIA) 10 MG tablet Take 1 tablet (10 mg total) by mouth daily. 90 tablet 3   furosemide (LASIX) 40 MG  tablet Take 40 mg by mouth daily.     gabapentin (NEURONTIN) 100 MG capsule Take 1 capsule (100 mg total) by mouth at bedtime. 30 capsule 3   hydrOXYzine (ATARAX) 10 MG tablet TAKE 1 TABLET BY MOUTH 3 TIMES DAILY AS NEEDED FOR ANXIETY 30 tablet 2   insulin aspart (NOVOLOG FLEXPEN) 100 UNIT/ML FlexPen Inject 13 Units into the skin every morning.     isosorbide mononitrate (IMDUR) 30 MG 24 hr tablet Take 1 tablet (30 mg total) by mouth 2 (two) times daily. 60 tablet 2   JARDIANCE 10 MG TABS tablet TAKE ONE TABLET (10 MG TOTAL) BY MOUTH DAILY. 30 tablet 5   nitroGLYCERIN (NITROSTAT) 0.4 MG SL tablet DISSOLVE 1 TABLET UNDER TONGUE AS NEEDEDFOR CHEST PAIN. MAY REPEAT 5 MINUTES APART 3 TIMES IF NEEDED 25 tablet 3   spironolactone (ALDACTONE) 25 MG tablet TAKE ONE TABLET (25 MG TOTAL) BY MOUTH DAILY. 30 tablet 5   venlafaxine XR (EFFEXOR-XR) 150 MG 24 hr capsule TAKE ONE CAPSULE BY MOUTH DAILY WITH BREAKFAST. TAKE WITH EFFEXOR XR 75MG  FOR A TOTAL OF 225MG  90 capsule 0   venlafaxine XR (EFFEXOR-XR) 75 MG 24 hr capsule TAKE ONE CAPSULE BY MOUTH ONCE DAILY. TAKE IN ADDITION TO THE 150 MG CAPSULE FOR A TOTAL DOSE OF 225MG  DAILY 90 capsule 0   HYDROcodone-acetaminophen (NORCO/VICODIN) 5-325 MG tablet Take 1-2 tablets by mouth daily as needed for moderate pain. 60 tablet 0   TRESIBA FLEXTOUCH 100 UNIT/ML FlexTouch Pen INJECT 50 UNITS INTO THE SKIN DAILY 15 mL 2   TRUEPLUS 5-BEVEL PEN NEEDLES 31G X 6 MM MISC USE TO INJECT INSULIN THREE TIMES A DAY 300 each 3   vitamin B-12 (CYANOCOBALAMIN) 1000 MCG tablet Take 1,000 mcg by mouth daily.     No facility-administered medications prior to visit.     Per HPI unless specifically indicated in ROS section below Review of Systems  Constitutional:  Negative for fatigue and fever.  HENT:  Negative for ear pain.   Eyes:  Negative for pain.  Respiratory:  Negative for cough and shortness of breath.   Cardiovascular:  Negative for chest pain, palpitations and leg  swelling.  Gastrointestinal:  Negative for abdominal pain.  Genitourinary:  Negative for dysuria.  Musculoskeletal:  Positive for back pain. Negative for arthralgias.  Neurological:  Negative for syncope, light-headedness and headaches.  Psychiatric/Behavioral:  Negative for dysphoric mood.    Objective:  BP 114/72   Pulse 80   Temp 97.7 F (36.5 C) (Oral)   Ht 5\' 6"  (1.676 m)   Wt 253 lb  9.6 oz (115 kg)   SpO2 99%   BMI 40.93 kg/m   Wt Readings from Last 3 Encounters:  06/19/23 256 lb (116.1 kg)  06/08/23 253 lb 9.6 oz (115 kg)  06/01/23 252 lb (114.3 kg)      Physical Exam Constitutional:      Appearance: He is well-developed. He is obese.  HENT:     Head: Normocephalic.     Right Ear: Hearing normal.     Left Ear: Hearing normal.     Nose: Nose normal.  Neck:     Thyroid: No thyroid mass or thyromegaly.     Vascular: No carotid bruit.     Trachea: Trachea normal.  Cardiovascular:     Rate and Rhythm: Normal rate. Rhythm irregularly irregular.     Pulses: Normal pulses.     Heart sounds: Heart sounds not distant. No murmur heard.    No friction rub. No gallop.     Comments: No peripheral edema Pulmonary:     Effort: Pulmonary effort is normal. No respiratory distress.     Breath sounds: Normal breath sounds.  Musculoskeletal:     Right shoulder: Decreased range of motion.     Left shoulder: Decreased range of motion.     Cervical back: Decreased range of motion.     Thoracic back: Decreased range of motion.     Lumbar back: Tenderness and bony tenderness present. Decreased range of motion. Positive right straight leg raise test and positive left straight leg raise test.     Right hip: Decreased range of motion.     Left hip: Decreased range of motion.     Right knee: Bony tenderness present. Decreased range of motion. Tenderness present over the medial joint line and lateral joint line.     Left knee: Bony tenderness present. Decreased range of motion.  Tenderness present over the medial joint line and lateral joint line.     Comments:  Pain in legs with standing 10/10  Skin:    General: Skin is warm and dry.     Findings: No rash.  Neurological:     Mental Status: He is oriented to person, place, and time. Mental status is at baseline.     Cranial Nerves: Cranial nerves 2-12 are intact.     Sensory: Sensory deficit present.     Motor: Weakness present.     Comments: Decreased sensation to light touch in feet Upper extremity strength 5/5 bilaterally except 4/5 grip strength bilaterally Hip flexor strength 3/5 bilaterally Knee extension and flexion 4/5 bilaterally Ankle flexion extension 4/5 bilaterally  Gait wobbly and antalgic. Unable to ambulate more than 5 feet without assistance. Can only stand unassisted for 5 seconds without needing to sit given bilateral leg weakness.  Psychiatric:        Speech: Speech normal.        Behavior: Behavior normal.        Thought Content: Thought content normal.       Results for orders placed or performed in visit on 05/18/23  Microalbumin / creatinine urine ratio  Result Value Ref Range   Microalb, Ur <0.7 0.0 - 1.9 mg/dL   Creatinine,U 09.8 mg/dL   Microalb Creat Ratio 0.9 0.0 - 30.0 mg/g  Comprehensive metabolic panel  Result Value Ref Range   Sodium 140 135 - 145 mEq/L   Potassium 4.3 3.5 - 5.1 mEq/L   Chloride 102 96 - 112 mEq/L   CO2 30 19 - 32 mEq/L  Glucose, Bld 154 (H) 70 - 99 mg/dL   BUN 25 (H) 6 - 23 mg/dL   Creatinine, Ser 6.04 0.40 - 1.50 mg/dL   Total Bilirubin 0.4 0.2 - 1.2 mg/dL   Alkaline Phosphatase 83 39 - 117 U/L   AST 15 0 - 37 U/L   ALT 17 0 - 53 U/L   Total Protein 7.1 6.0 - 8.3 g/dL   Albumin 3.8 3.5 - 5.2 g/dL   GFR 54.09 >81.19 mL/min   Calcium 9.3 8.4 - 10.5 mg/dL  Lipid panel  Result Value Ref Range   Cholesterol 146 0 - 200 mg/dL   Triglycerides 14.7 0.0 - 149.0 mg/dL   HDL 82.95 (L) >62.13 mg/dL   VLDL 08.6 0.0 - 57.8 mg/dL   LDL Cholesterol  92 0 - 99 mg/dL   Total CHOL/HDL Ratio 4    NonHDL 107.57   Hemoglobin A1c  Result Value Ref Range   Hgb A1c MFr Bld 7.9 (H) 4.6 - 6.5 %  DRUG MONITORING, PANEL 8 WITH CONFIRMATION, URINE  Result Value Ref Range   Alcohol Metabolites NEGATIVE <500 ng/mL   Amphetamines NEGATIVE <500 ng/mL   Benzodiazepines NEGATIVE <100 ng/mL   Buprenorphine, Urine NEGATIVE <5 ng/mL   Cocaine Metabolite NEGATIVE <150 ng/mL   6 Acetylmorphine NEGATIVE <10 ng/mL   Marijuana Metabolite NEGATIVE <20 ng/mL   MDMA NEGATIVE <500 ng/mL   Opiates POSITIVE (A) <100 ng/mL   Codeine NEGATIVE <50 ng/mL   Hydrocodone 242 (H) <50 ng/mL   Hydromorphone NEGATIVE <50 ng/mL   Morphine NEGATIVE <50 ng/mL   Norhydrocodone 251 (H) <50 ng/mL   Opiates Comments     Oxycodone NEGATIVE <100 ng/mL   Creatinine 87.7 > or = 20.0 mg/dL   pH 5.5 4.5 - 9.0   Oxidant NEGATIVE <200 mcg/mL  DM TEMPLATE  Result Value Ref Range   Notes and Comments      Assessment and Plan  Alteration in mobility associated with pain Assessment & Plan: Decreased mobility due to chronic low back pain, chronic leg weakness, morbid obesity, peripheral neuropathy, PAD and balance issues resulting in need for scooter/power mobility device to use  in house.   PMD, such as a scooter, is necessary to perform ADLs at home including getting to bathroom to toilet, getting to kitchen to eat and cooking, getting to bedroom to dress etc.  He is out of balance using a cane.  Legs given out on him after 8 feet of walking... new diagnosis of PAD and  recent revascularization in right leg... but no improvement in leg pain/weakness. Can  still only stand 30 seconds with leaning on anything. He has fallen multiple times given leg weakness and balance issues.  He cannot use a cane/walker given his poor balance and a leg weakness.  His finger numbness  and cramping that makes it difficult propel a manual wheelchair. Also chronic low back pain limits his ablity to  maneuver a manual wheel chair.    He has poor balance and frequent falls given balnce , pain and weakness issues  He has a ramp at home given he does not have strength to step up  a steps. He has back pain and decreased back stability that would make the scooter the best device for his use.  He can operate a mobility device safely, both mentally and physically. A scooter would be able to fit in his house  He is willing and motivated to use the device.Marland Kitchen He would uses it  daily during all waking hours.   Chronic midline low back pain, unspecified whether sciatica present Assessment & Plan: Chronic, given poor pain control he is requesting escalation of narcotic dose and changed to oxycodone.  We discussed the significant risk of doing this. He was unable to tolerate gabapentin 100 mg at bedtime for pain. We will try increasing hydrocodone to 3 times a day but he may need a referral back to a pain center.  No recent MRI.  Last Xray 2022 reviewed.. Degenerative disease with disc height loss throughout the lumbar spine most severe at L3-4 and L4-5. Bilateral facet arthropathy of the lumbar spine.     Other orders -     HYDROcodone-Acetaminophen; Take 1 tablet by mouth 3 (three) times daily as needed for moderate pain.  Dispense: 90 tablet; Refill: 0    No follow-ups on file.   Kerby Nora, MD

## 2023-06-13 ENCOUNTER — Encounter: Payer: PPO | Admitting: Physician Assistant

## 2023-06-13 ENCOUNTER — Encounter: Payer: PPO | Admitting: Family

## 2023-06-13 DIAGNOSIS — L97512 Non-pressure chronic ulcer of other part of right foot with fat layer exposed: Secondary | ICD-10-CM | POA: Diagnosis not present

## 2023-06-13 DIAGNOSIS — E11621 Type 2 diabetes mellitus with foot ulcer: Secondary | ICD-10-CM | POA: Diagnosis not present

## 2023-06-13 DIAGNOSIS — L97412 Non-pressure chronic ulcer of right heel and midfoot with fat layer exposed: Secondary | ICD-10-CM | POA: Diagnosis not present

## 2023-06-13 NOTE — Progress Notes (Signed)
and wrapping as opposed to using the Band-Aids. 06-13-2023 upon evaluation today patient appears to be doing excellent in regard to all 3 wound locations. He has been tolerating the dressing changes without complication and in general I believe that he is making excellent headway towards closure which is great news. No fevers, chills, nausea, vomiting, or diarrhea. Electronic Signature(s) Signed: 06/13/2023 5:55:42 PM By: Allen Derry PA-C Entered By: Allen Derry on 06/13/2023  17:55:42 -------------------------------------------------------------------------------- Physical Exam Details Patient Name: Date of Service: [name] HN H. 06/13/2023 2:45 PM Medical Record Number: [id number] Patient Account Number: [id number] Date of Birth/Sex: Treating RN: 08-02-1951 (72 y.o. [name] Primary Care Provider: Kerby Nora Other Clinician: Betha Loa Referring Provider: Treating Provider/Extender: [name], [name] in Treatment: 62 Constitutional Well-nourished and well-hydrated in no acute distress. Respiratory normal breathing without difficulty. Psychiatric this patient is able to make decisions and demonstrates good insight into disease process. Alert and Oriented x 3. pleasant and cooperative. [name], [name] ([id number]) [id number] Page 3 of 8 Notes Upon inspection patient's wound bed actually showed signs of excellent granulation and epithelization at this point. Fortunately I do not see any evidence of worsening overall and I believe that the patient is making really good headway towards complete closure. Electronic Signature(s) Signed: 06/13/2023 5:55:55 PM By: Allen Derry PA-C Entered By: Allen Derry on 06/13/2023 17:55:55 -------------------------------------------------------------------------------- Physician Orders Details Patient Name: Date of Service: [name] HN H. 06/13/2023 2:45 PM Medical Record Number: [id number] Patient Account Number: [id number] Date of Birth/Sex: Treating RN: November 17, 1950 (72 y.o. [name] Primary Care Provider: Kerby Nora Other Clinician: Betha Loa Referring Provider: Treating Provider/Extender: [name], [name] in Treatment: 51 Verbal / Phone Orders: Yes Clinician: Midge Aver Read Back and Verified: Yes Diagnosis Coding ICD-10 Coding Code Description E11.621 Type 2 diabetes mellitus with foot ulcer L97.512 Non-pressure chronic  ulcer of other part of right foot with fat layer exposed I25.10 Atherosclerotic heart disease of native coronary artery without angina pectoris I48.0 Paroxysmal atrial fibrillation Z79.01 Long term (current) use of anticoagulants I10 Essential (primary) hypertension Follow-up Appointments Return Appointment in 1 week. Bathing/ [employer] wounds with antibacterial soap and water. Anesthetic (Use 'Patient Medications' Section for Anesthetic Order Entry) Lidocaine applied to wound bed Wound Treatment Wound #1R - Calcaneus Wound Laterality: Right Cleanser: Byram Ancillary Kit - 15 Day Supply (DME) (Generic) Every Other Day/30 Days Discharge Instructions: Use supplies as instructed; Kit contains: (15) Saline Bullets; (15) 3x3 Gauze; 15 pr Gloves Cleanser: Soap and Water Every Other Day/30 Days Discharge Instructions: Gently cleanse wound with antibacterial soap, rinse and pat dry prior to dressing wounds Prim Dressing: Prisma 4.34 (in) Every Other Day/30 Days ary Discharge Instructions: Moisten w/normal saline or sterile water; Cover wound as directed. Do not remove from wound bed. Secondary Dressing: Zetuvit Plus 4x4 (in/in) (DME) (Generic) Every Other Day/30 Days Wound #2R - Foot Wound Laterality: Dorsal, Right, Distal Cleanser: Byram Ancillary Kit - 15 Day Supply (DME) (Generic) Every Other Day/30 Days Discharge Instructions: Use supplies as instructed; Kit contains: (15) Saline Bullets; (15) 3x3 Gauze; 15 pr Gloves Cleanser: Soap and Water Every Other Day/30 Days Discharge Instructions: Gently cleanse wound with antibacterial soap, rinse and pat dry prior to dressing wounds [name], [name] ([id number]) [id number] Page 4 of 8 Prim Dressing: Prisma 4.34 (in) Every Other Day/30 Days ary Discharge Instructions: Moisten w/normal saline or sterile water; Cover wound as directed. Do not remove from wound bed. Secondary Dressing: Zetuvit  [name], [name] ([id number]) [id number] Page 1 of 8 Visit Report for 06/13/2023 Chief Complaint Document Details Patient Name: Date of Service: [name] 06/13/2023 2:45 PM Medical Record Number: [id number] Patient Account Number: [id number] Date of Birth/Sex: Treating RN: Dec 20, 1950 (72 y.o. [name] Primary Care Provider: Kerby Nora Other Clinician: Betha Loa Referring Provider: Treating Provider/Extender: [name], [name] in Treatment: 19 Information Obtained from: Patient Chief Complaint Right foot ulcers Electronic Signature(s) Signed: 06/13/2023 3:32:22 PM By: Allen Derry PA-C Entered By: Allen Derry on 06/13/2023 15:32:22 -------------------------------------------------------------------------------- HPI Details Patient Name: Date of Service: [name] HN H. 06/13/2023 2:45 PM Medical Record Number: [id number] Patient Account Number: [id number] Date of Birth/Sex: Treating RN: 1951/08/25 (72 y.o. [name] Primary Care Provider: Kerby Nora Other Clinician: Betha Loa Referring Provider: Treating Provider/Extender: [name], [name] in Treatment: 9 History of Present Illness HPI Description: 01-31-2023 upon evaluation today patient appears to be doing poorly currently in regard to wounds on his right heel, right dorsal foot, and right lateral foot. Subsequently he does have known peripheral vascular disease and again this is something that he needs to I think Checked formally. This is what the majority of the conversation today hinged around and the patient voiced understanding as far as that is concerned. Fortunately I do not see any signs of active infection locally nor systemically which is great news. Patient does have a history of diabetes mellitus type 2, atrial fibrillation for which she is on long-term anticoagulant Therapy, hypertension, and coronary artery disease. Patient's  hemoglobin A1c most recently was on 11-17-2022 and was 7.2 and currently he is on Eliquis and Plavix. 02-07-2023 upon evaluation today patient appears to be doing well currently in regard to his wounds all things considered I feel like we are still maintaining that does not seem like it is any worse is also not significantly better. I discussed with the patient that I do believe he would benefit from the arterial evaluation we still need to get this done as quickly as possible and subsequently we did put in a follow-up call with the vascular office today with regard to this. 02-14-2023 upon evaluation today patient appears to be doing a little better in regard to his wounds which are showing signs of loosening which is great news. With that being said he is experiencing an improvement overall in his symptoms and we are still waiting on the vascular evaluation want to get this result and consider whether or not his blood flow is good or not we will be able to make a better determination of next steps. For now we will try and avoid any aggressive sharp debridement to know that he has good arterial flow. [name], [name] ([id number]) [id number] Page 2 of 8 02-21-2023 upon evaluation today patient appears to be doing about the same in regard to his wound. Fortunately there does not appear to be any signs of active infection at this time which is great news. No fevers, chills, nausea, vomiting, or diarrhea. I did review patient's arterial study and it appears that he has pretty good flow in the left is not perfect but it is decent. On the right however he is definitely not doing nearly as good and I think that he is going to need to see one of the vascular doctors for further evaluation and treatment of this right leg in order to get these wounds to heal. 02-28-2023 upon evaluation today patient appears to  0.031cm^3 volume. There is Fat Layer (Subcutaneous Tissue) exposed. There is a medium amount of serosanguineous drainage noted. There is medium (34-66%) red granulation within the wound bed. There is a medium (34-66%) amount of necrotic tissue within the wound bed including Adherent Slough. Assessment Active Problems ICD-10 Type 2 diabetes mellitus with foot ulcer Non-pressure chronic ulcer of other part of right foot with fat layer  exposed Atherosclerotic heart disease of native coronary artery without angina pectoris Paroxysmal atrial fibrillation Long term (current) use of anticoagulants Essential (primary) hypertension Plan [name], [name] ([id number]) [id number] Page 7 of 8 Follow-up Appointments: Return Appointment in 1 week. Bathing/ Shower/ Hygiene: Wash wounds with antibacterial soap and water. Anesthetic (Use 'Patient Medications' Section for Anesthetic Order Entry): Lidocaine applied to wound bed WOUND #1R: - Calcaneus Wound Laterality: Right Cleanser: Byram Ancillary Kit - 15 Day Supply (DME) (Generic) Every Other Day/30 Days Discharge Instructions: Use supplies as instructed; Kit contains: (15) Saline Bullets; (15) 3x3 Gauze; 15 pr Gloves Cleanser: Soap and Water Every Other Day/30 Days Discharge Instructions: Gently cleanse wound with antibacterial soap, rinse and pat dry prior to dressing wounds Prim Dressing: Prisma 4.34 (in) Every Other Day/30 Days ary Discharge Instructions: Moisten w/normal saline or sterile water; Cover wound as directed. Do not remove from wound bed. Secondary Dressing: Zetuvit Plus 4x4 (in/in) (DME) (Generic) Every Other Day/30 Days WOUND #2R: - Foot Wound Laterality: Dorsal, Right, Distal Cleanser: Byram Ancillary Kit - 15 Day Supply (DME) (Generic) Every Other Day/30 Days Discharge Instructions: Use supplies as instructed; Kit contains: (15) Saline Bullets; (15) 3x3 Gauze; 15 pr Gloves Cleanser: Soap and Water Every Other Day/30 Days Discharge Instructions: Gently cleanse wound with antibacterial soap, rinse and pat dry prior to dressing wounds Prim Dressing: Prisma 4.34 (in) Every Other Day/30 Days ary Discharge Instructions: Moisten w/normal saline or sterile water; Cover wound as directed. Do not remove from wound bed. Secondary Dressing: Zetuvit Plus 4x4 (in/in) (DME) (Generic) Every Other Day/30 Days WOUND #3: - Foot Wound Laterality: Dorsal,  Right, Proximal Cleanser: Byram Ancillary Kit - 15 Day Supply (DME) (Generic) Every Other Day/30 Days Discharge Instructions: Use supplies as instructed; Kit contains: (15) Saline Bullets; (15) 3x3 Gauze; 15 pr Gloves Cleanser: Soap and Water Every Other Day/30 Days Discharge Instructions: Gently cleanse wound with antibacterial soap, rinse and pat dry prior to dressing wounds Prim Dressing: Prisma 4.34 (in) Every Other Day/30 Days ary Discharge Instructions: Moisten w/normal saline or sterile water; Cover wound as directed. Do not remove from wound bed. Secondary Dressing: Zetuvit Plus 4x4 (in/in) (DME) (Generic) Every Other Day/30 Days 1. I would recommend that we have the patient continue to monitor for any evidence of infection or worsening in general I do believe that were making good headway towards closure and I think he is doing quite well with the collagen. 2. I would recommend as well that he continue to monitor for any signs of infection or worsening if anything changes he can contact the office and let me know. We will see patient back for reevaluation in 1 week here in the clinic. If anything worsens or changes patient will contact our office for additional recommendations. Electronic Signature(s) Signed: 06/13/2023 5:56:45 PM By: Allen Derry PA-C Entered By: Allen Derry on 06/13/2023 17:56:45 -------------------------------------------------------------------------------- SuperBill Details Patient Name: Date of Service: [name] HN H. 06/13/2023 Medical Record Number: [id number] Patient Account Number: [id number] Date of Birth/Sex: Treating RN: 29-Nov-1950 (72 y.o. [name] Primary Care Provider: Kerby Nora Other Clinician: Glynda Jaeger,  0.031cm^3 volume. There is Fat Layer (Subcutaneous Tissue) exposed. There is a medium amount of serosanguineous drainage noted. There is medium (34-66%) red granulation within the wound bed. There is a medium (34-66%) amount of necrotic tissue within the wound bed including Adherent Slough. Assessment Active Problems ICD-10 Type 2 diabetes mellitus with foot ulcer Non-pressure chronic ulcer of other part of right foot with fat layer  exposed Atherosclerotic heart disease of native coronary artery without angina pectoris Paroxysmal atrial fibrillation Long term (current) use of anticoagulants Essential (primary) hypertension Plan [name], [name] ([id number]) [id number] Page 7 of 8 Follow-up Appointments: Return Appointment in 1 week. Bathing/ Shower/ Hygiene: Wash wounds with antibacterial soap and water. Anesthetic (Use 'Patient Medications' Section for Anesthetic Order Entry): Lidocaine applied to wound bed WOUND #1R: - Calcaneus Wound Laterality: Right Cleanser: Byram Ancillary Kit - 15 Day Supply (DME) (Generic) Every Other Day/30 Days Discharge Instructions: Use supplies as instructed; Kit contains: (15) Saline Bullets; (15) 3x3 Gauze; 15 pr Gloves Cleanser: Soap and Water Every Other Day/30 Days Discharge Instructions: Gently cleanse wound with antibacterial soap, rinse and pat dry prior to dressing wounds Prim Dressing: Prisma 4.34 (in) Every Other Day/30 Days ary Discharge Instructions: Moisten w/normal saline or sterile water; Cover wound as directed. Do not remove from wound bed. Secondary Dressing: Zetuvit Plus 4x4 (in/in) (DME) (Generic) Every Other Day/30 Days WOUND #2R: - Foot Wound Laterality: Dorsal, Right, Distal Cleanser: Byram Ancillary Kit - 15 Day Supply (DME) (Generic) Every Other Day/30 Days Discharge Instructions: Use supplies as instructed; Kit contains: (15) Saline Bullets; (15) 3x3 Gauze; 15 pr Gloves Cleanser: Soap and Water Every Other Day/30 Days Discharge Instructions: Gently cleanse wound with antibacterial soap, rinse and pat dry prior to dressing wounds Prim Dressing: Prisma 4.34 (in) Every Other Day/30 Days ary Discharge Instructions: Moisten w/normal saline or sterile water; Cover wound as directed. Do not remove from wound bed. Secondary Dressing: Zetuvit Plus 4x4 (in/in) (DME) (Generic) Every Other Day/30 Days WOUND #3: - Foot Wound Laterality: Dorsal,  Right, Proximal Cleanser: Byram Ancillary Kit - 15 Day Supply (DME) (Generic) Every Other Day/30 Days Discharge Instructions: Use supplies as instructed; Kit contains: (15) Saline Bullets; (15) 3x3 Gauze; 15 pr Gloves Cleanser: Soap and Water Every Other Day/30 Days Discharge Instructions: Gently cleanse wound with antibacterial soap, rinse and pat dry prior to dressing wounds Prim Dressing: Prisma 4.34 (in) Every Other Day/30 Days ary Discharge Instructions: Moisten w/normal saline or sterile water; Cover wound as directed. Do not remove from wound bed. Secondary Dressing: Zetuvit Plus 4x4 (in/in) (DME) (Generic) Every Other Day/30 Days 1. I would recommend that we have the patient continue to monitor for any evidence of infection or worsening in general I do believe that were making good headway towards closure and I think he is doing quite well with the collagen. 2. I would recommend as well that he continue to monitor for any signs of infection or worsening if anything changes he can contact the office and let me know. We will see patient back for reevaluation in 1 week here in the clinic. If anything worsens or changes patient will contact our office for additional recommendations. Electronic Signature(s) Signed: 06/13/2023 5:56:45 PM By: Allen Derry PA-C Entered By: Allen Derry on 06/13/2023 17:56:45 -------------------------------------------------------------------------------- SuperBill Details Patient Name: Date of Service: [name] HN H. 06/13/2023 Medical Record Number: [id number] Patient Account Number: [id number] Date of Birth/Sex: Treating RN: 29-Nov-1950 (72 y.o. [name] Primary Care Provider: Kerby Nora Other Clinician: Glynda Jaeger,  0.031cm^3 volume. There is Fat Layer (Subcutaneous Tissue) exposed. There is a medium amount of serosanguineous drainage noted. There is medium (34-66%) red granulation within the wound bed. There is a medium (34-66%) amount of necrotic tissue within the wound bed including Adherent Slough. Assessment Active Problems ICD-10 Type 2 diabetes mellitus with foot ulcer Non-pressure chronic ulcer of other part of right foot with fat layer  exposed Atherosclerotic heart disease of native coronary artery without angina pectoris Paroxysmal atrial fibrillation Long term (current) use of anticoagulants Essential (primary) hypertension Plan [name], [name] ([id number]) [id number] Page 7 of 8 Follow-up Appointments: Return Appointment in 1 week. Bathing/ Shower/ Hygiene: Wash wounds with antibacterial soap and water. Anesthetic (Use 'Patient Medications' Section for Anesthetic Order Entry): Lidocaine applied to wound bed WOUND #1R: - Calcaneus Wound Laterality: Right Cleanser: Byram Ancillary Kit - 15 Day Supply (DME) (Generic) Every Other Day/30 Days Discharge Instructions: Use supplies as instructed; Kit contains: (15) Saline Bullets; (15) 3x3 Gauze; 15 pr Gloves Cleanser: Soap and Water Every Other Day/30 Days Discharge Instructions: Gently cleanse wound with antibacterial soap, rinse and pat dry prior to dressing wounds Prim Dressing: Prisma 4.34 (in) Every Other Day/30 Days ary Discharge Instructions: Moisten w/normal saline or sterile water; Cover wound as directed. Do not remove from wound bed. Secondary Dressing: Zetuvit Plus 4x4 (in/in) (DME) (Generic) Every Other Day/30 Days WOUND #2R: - Foot Wound Laterality: Dorsal, Right, Distal Cleanser: Byram Ancillary Kit - 15 Day Supply (DME) (Generic) Every Other Day/30 Days Discharge Instructions: Use supplies as instructed; Kit contains: (15) Saline Bullets; (15) 3x3 Gauze; 15 pr Gloves Cleanser: Soap and Water Every Other Day/30 Days Discharge Instructions: Gently cleanse wound with antibacterial soap, rinse and pat dry prior to dressing wounds Prim Dressing: Prisma 4.34 (in) Every Other Day/30 Days ary Discharge Instructions: Moisten w/normal saline or sterile water; Cover wound as directed. Do not remove from wound bed. Secondary Dressing: Zetuvit Plus 4x4 (in/in) (DME) (Generic) Every Other Day/30 Days WOUND #3: - Foot Wound Laterality: Dorsal,  Right, Proximal Cleanser: Byram Ancillary Kit - 15 Day Supply (DME) (Generic) Every Other Day/30 Days Discharge Instructions: Use supplies as instructed; Kit contains: (15) Saline Bullets; (15) 3x3 Gauze; 15 pr Gloves Cleanser: Soap and Water Every Other Day/30 Days Discharge Instructions: Gently cleanse wound with antibacterial soap, rinse and pat dry prior to dressing wounds Prim Dressing: Prisma 4.34 (in) Every Other Day/30 Days ary Discharge Instructions: Moisten w/normal saline or sterile water; Cover wound as directed. Do not remove from wound bed. Secondary Dressing: Zetuvit Plus 4x4 (in/in) (DME) (Generic) Every Other Day/30 Days 1. I would recommend that we have the patient continue to monitor for any evidence of infection or worsening in general I do believe that were making good headway towards closure and I think he is doing quite well with the collagen. 2. I would recommend as well that he continue to monitor for any signs of infection or worsening if anything changes he can contact the office and let me know. We will see patient back for reevaluation in 1 week here in the clinic. If anything worsens or changes patient will contact our office for additional recommendations. Electronic Signature(s) Signed: 06/13/2023 5:56:45 PM By: Allen Derry PA-C Entered By: Allen Derry on 06/13/2023 17:56:45 -------------------------------------------------------------------------------- SuperBill Details Patient Name: Date of Service: [name] HN H. 06/13/2023 Medical Record Number: [id number] Patient Account Number: [id number] Date of Birth/Sex: Treating RN: 29-Nov-1950 (72 y.o. [name] Primary Care Provider: Kerby Nora Other Clinician: Glynda Jaeger,  0.031cm^3 volume. There is Fat Layer (Subcutaneous Tissue) exposed. There is a medium amount of serosanguineous drainage noted. There is medium (34-66%) red granulation within the wound bed. There is a medium (34-66%) amount of necrotic tissue within the wound bed including Adherent Slough. Assessment Active Problems ICD-10 Type 2 diabetes mellitus with foot ulcer Non-pressure chronic ulcer of other part of right foot with fat layer  exposed Atherosclerotic heart disease of native coronary artery without angina pectoris Paroxysmal atrial fibrillation Long term (current) use of anticoagulants Essential (primary) hypertension Plan [name], [name] ([id number]) [id number] Page 7 of 8 Follow-up Appointments: Return Appointment in 1 week. Bathing/ Shower/ Hygiene: Wash wounds with antibacterial soap and water. Anesthetic (Use 'Patient Medications' Section for Anesthetic Order Entry): Lidocaine applied to wound bed WOUND #1R: - Calcaneus Wound Laterality: Right Cleanser: Byram Ancillary Kit - 15 Day Supply (DME) (Generic) Every Other Day/30 Days Discharge Instructions: Use supplies as instructed; Kit contains: (15) Saline Bullets; (15) 3x3 Gauze; 15 pr Gloves Cleanser: Soap and Water Every Other Day/30 Days Discharge Instructions: Gently cleanse wound with antibacterial soap, rinse and pat dry prior to dressing wounds Prim Dressing: Prisma 4.34 (in) Every Other Day/30 Days ary Discharge Instructions: Moisten w/normal saline or sterile water; Cover wound as directed. Do not remove from wound bed. Secondary Dressing: Zetuvit Plus 4x4 (in/in) (DME) (Generic) Every Other Day/30 Days WOUND #2R: - Foot Wound Laterality: Dorsal, Right, Distal Cleanser: Byram Ancillary Kit - 15 Day Supply (DME) (Generic) Every Other Day/30 Days Discharge Instructions: Use supplies as instructed; Kit contains: (15) Saline Bullets; (15) 3x3 Gauze; 15 pr Gloves Cleanser: Soap and Water Every Other Day/30 Days Discharge Instructions: Gently cleanse wound with antibacterial soap, rinse and pat dry prior to dressing wounds Prim Dressing: Prisma 4.34 (in) Every Other Day/30 Days ary Discharge Instructions: Moisten w/normal saline or sterile water; Cover wound as directed. Do not remove from wound bed. Secondary Dressing: Zetuvit Plus 4x4 (in/in) (DME) (Generic) Every Other Day/30 Days WOUND #3: - Foot Wound Laterality: Dorsal,  Right, Proximal Cleanser: Byram Ancillary Kit - 15 Day Supply (DME) (Generic) Every Other Day/30 Days Discharge Instructions: Use supplies as instructed; Kit contains: (15) Saline Bullets; (15) 3x3 Gauze; 15 pr Gloves Cleanser: Soap and Water Every Other Day/30 Days Discharge Instructions: Gently cleanse wound with antibacterial soap, rinse and pat dry prior to dressing wounds Prim Dressing: Prisma 4.34 (in) Every Other Day/30 Days ary Discharge Instructions: Moisten w/normal saline or sterile water; Cover wound as directed. Do not remove from wound bed. Secondary Dressing: Zetuvit Plus 4x4 (in/in) (DME) (Generic) Every Other Day/30 Days 1. I would recommend that we have the patient continue to monitor for any evidence of infection or worsening in general I do believe that were making good headway towards closure and I think he is doing quite well with the collagen. 2. I would recommend as well that he continue to monitor for any signs of infection or worsening if anything changes he can contact the office and let me know. We will see patient back for reevaluation in 1 week here in the clinic. If anything worsens or changes patient will contact our office for additional recommendations. Electronic Signature(s) Signed: 06/13/2023 5:56:45 PM By: Allen Derry PA-C Entered By: Allen Derry on 06/13/2023 17:56:45 -------------------------------------------------------------------------------- SuperBill Details Patient Name: Date of Service: [name] HN H. 06/13/2023 Medical Record Number: [id number] Patient Account Number: [id number] Date of Birth/Sex: Treating RN: 29-Nov-1950 (72 y.o. [name] Primary Care Provider: Kerby Nora Other Clinician: Glynda Jaeger,  [name], [name] ([id number]) [id number] Page 1 of 8 Visit Report for 06/13/2023 Chief Complaint Document Details Patient Name: Date of Service: [name] 06/13/2023 2:45 PM Medical Record Number: [id number] Patient Account Number: [id number] Date of Birth/Sex: Treating RN: Dec 20, 1950 (72 y.o. [name] Primary Care Provider: Kerby Nora Other Clinician: Betha Loa Referring Provider: Treating Provider/Extender: [name], [name] in Treatment: 19 Information Obtained from: Patient Chief Complaint Right foot ulcers Electronic Signature(s) Signed: 06/13/2023 3:32:22 PM By: Allen Derry PA-C Entered By: Allen Derry on 06/13/2023 15:32:22 -------------------------------------------------------------------------------- HPI Details Patient Name: Date of Service: [name] HN H. 06/13/2023 2:45 PM Medical Record Number: [id number] Patient Account Number: [id number] Date of Birth/Sex: Treating RN: 1951/08/25 (72 y.o. [name] Primary Care Provider: Kerby Nora Other Clinician: Betha Loa Referring Provider: Treating Provider/Extender: [name], [name] in Treatment: 9 History of Present Illness HPI Description: 01-31-2023 upon evaluation today patient appears to be doing poorly currently in regard to wounds on his right heel, right dorsal foot, and right lateral foot. Subsequently he does have known peripheral vascular disease and again this is something that he needs to I think Checked formally. This is what the majority of the conversation today hinged around and the patient voiced understanding as far as that is concerned. Fortunately I do not see any signs of active infection locally nor systemically which is great news. Patient does have a history of diabetes mellitus type 2, atrial fibrillation for which she is on long-term anticoagulant Therapy, hypertension, and coronary artery disease. Patient's  hemoglobin A1c most recently was on 11-17-2022 and was 7.2 and currently he is on Eliquis and Plavix. 02-07-2023 upon evaluation today patient appears to be doing well currently in regard to his wounds all things considered I feel like we are still maintaining that does not seem like it is any worse is also not significantly better. I discussed with the patient that I do believe he would benefit from the arterial evaluation we still need to get this done as quickly as possible and subsequently we did put in a follow-up call with the vascular office today with regard to this. 02-14-2023 upon evaluation today patient appears to be doing a little better in regard to his wounds which are showing signs of loosening which is great news. With that being said he is experiencing an improvement overall in his symptoms and we are still waiting on the vascular evaluation want to get this result and consider whether or not his blood flow is good or not we will be able to make a better determination of next steps. For now we will try and avoid any aggressive sharp debridement to know that he has good arterial flow. [name], [name] ([id number]) [id number] Page 2 of 8 02-21-2023 upon evaluation today patient appears to be doing about the same in regard to his wound. Fortunately there does not appear to be any signs of active infection at this time which is great news. No fevers, chills, nausea, vomiting, or diarrhea. I did review patient's arterial study and it appears that he has pretty good flow in the left is not perfect but it is decent. On the right however he is definitely not doing nearly as good and I think that he is going to need to see one of the vascular doctors for further evaluation and treatment of this right leg in order to get these wounds to heal. 02-28-2023 upon evaluation today patient appears to  Plus 4x4 (in/in)  (DME) (Generic) Every Other Day/30 Days Wound #3 - Foot Wound Laterality: Dorsal, Right, Proximal Cleanser: Byram Ancillary Kit - 15 Day Supply (DME) (Generic) Every Other Day/30 Days Discharge Instructions: Use supplies as instructed; Kit contains: (15) Saline Bullets; (15) 3x3 Gauze; 15 pr Gloves Cleanser: Soap and Water Every Other Day/30 Days Discharge Instructions: Gently cleanse wound with antibacterial soap, rinse and pat dry prior to dressing wounds Prim Dressing: Prisma 4.34 (in) Every Other Day/30 Days ary Discharge Instructions: Moisten w/normal saline or sterile water; Cover wound as directed. Do not remove from wound bed. Secondary Dressing: Zetuvit Plus 4x4 (in/in) (DME) (Generic) Every Other Day/30 Days Electronic Signature(s) Signed: 06/13/2023 4:35:53 PM By: Betha Loa Signed: 06/13/2023 6:10:25 PM By: Allen Derry PA-C Entered By: Betha Loa on 06/13/2023 16:03:11 -------------------------------------------------------------------------------- Problem List Details Patient Name: Date of Service: [name] HN H. 06/13/2023 2:45 PM Medical Record Number: [id number] Patient Account Number: [id number] Date of Birth/Sex: Treating RN: [birth year] (72 y.o. [name] Primary Care Provider: Kerby Nora Other Clinician: Betha Loa Referring Provider: Treating Provider/Extender: [name], [name] in Treatment: 38 Active Problems ICD-10 Encounter Code Description Active Date MDM Diagnosis E11.621 Type 2 diabetes mellitus with foot ulcer 01/31/2023 No Yes L97.512 Non-pressure chronic ulcer of other part of right foot with fat layer exposed 01/31/2023 No Yes I25.10 Atherosclerotic heart disease of native coronary artery without angina pectoris 01/31/2023 No Yes I48.0 Paroxysmal atrial fibrillation 01/31/2023 No Yes Z79.01 Long term (current) use of anticoagulants 01/31/2023 No Yes I10 Essential (primary) hypertension 01/31/2023 No Yes [name], [name]  ([id number]) [id number] Page 5 of 8 Inactive Problems Resolved Problems Electronic Signature(s) Signed: 06/13/2023 3:32:13 PM By: Allen Derry PA-C Entered By: Allen Derry on 06/13/2023 15:32:13 -------------------------------------------------------------------------------- Progress Note Details Patient Name: Date of Service: [name] HN H. 06/13/2023 2:45 PM Medical Record Number: [id number] Patient Account Number: [id number] Date of Birth/Sex: Treating RN: 1951/07/16 (72 y.o. [name] Primary Care Provider: Kerby Nora Other Clinician: Betha Loa Referring Provider: Treating Provider/Extender: [name], [name] in Treatment: 19 Subjective Chief Complaint Information obtained from Patient Right foot ulcers History of Present Illness (HPI) 01-31-2023 upon evaluation today patient appears to be doing poorly currently in regard to wounds on his right heel, right dorsal foot, and right lateral foot. Subsequently he does have known peripheral vascular disease and again this is something that he needs to I think Checked formally. This is what the majority of the conversation today hinged around and the patient voiced understanding as far as that is concerned. Fortunately I do not see any signs of active infection locally nor systemically which is great news. Patient does have a history of diabetes mellitus type 2, atrial fibrillation for which she is on long-term anticoagulant Therapy, hypertension, and coronary artery disease. Patient's hemoglobin A1c most recently was on 11-17-2022 and was 7.2 and currently he is on Eliquis and Plavix. 02-07-2023 upon evaluation today patient appears to be doing well currently in regard to his wounds all things considered I feel like we are still maintaining that does not seem like it is any worse is also not significantly better. I discussed with the patient that I do believe he would benefit from the  arterial evaluation we still need to get this done as quickly as possible and subsequently we did put in a follow-up call with the vascular office today with regard to this. 02-14-2023 upon evaluation today patient appears to be doing a

## 2023-06-14 DIAGNOSIS — E11621 Type 2 diabetes mellitus with foot ulcer: Secondary | ICD-10-CM | POA: Diagnosis not present

## 2023-06-16 NOTE — Progress Notes (Signed)
Congestive Heart Failure, Coronary Comorbid History: Artery Disease, Hypertension, Type II Artery Disease, Hypertension, Type II Artery Disease, Hypertension, Type II Diabetes, Neuropathy Diabetes, Neuropathy Diabetes, Neuropathy 12/10/2022 12/10/2022 12/10/2022 Date Acquired: 19 19 19  [name] of Treatment: Open Open Open Wound Status: No No No Wound Recurrence: Yes No No Clustered Wound: 1x0.3x0.1 1.2x1.5x0.2 0.5x0.4x0.2 Measurements L x W x D (cm) 0.236 1.414 0.157 A (cm) : rea 0.024 0.283 0.031 Volume (cm) : 94.50% -63.70% 93.80% % Reduction in A rea: 94.40% -229.10% 87.60% % Reduction in Volume: Grade 1 Grade 1 Grade 1 Classification: None Present None Present Medium Exudate A mount: N/A N/A Serosanguineous Exudate Type: N/A N/A red, brown Exudate Color: Medium (34-66%) Medium (34-66%) Medium (34-66%) Granulation A mount: Red Red, Pink Red Granulation Quality: None Present (0%) None Present (0%) Medium (34-66%) Necrotic A mountHEATON, [name] ([id number]) [id number] Page 5 of 11 Fat Layer (Subcutaneous Tissue): Yes Fat Layer (Subcutaneous Tissue): Yes Fat Layer (Subcutaneous Tissue): Yes Exposed Structures: Fascia: No Fascia: No Fascia: No Tendon: No Tendon: No Tendon: No Muscle: No Muscle: No Muscle:  No Joint: No Joint: No Joint: No Bone: No Bone: No Bone: No Large (67-100%) Large (67-100%) Small (1-33%) Epithelialization: Treatment Notes Electronic Signature(s) Signed: 06/13/2023 4:35:53 PM By: [name] Entered By: [name] on 06/13/2023 15:11:58 -------------------------------------------------------------------------------- Multi-Disciplinary Care Plan Details Patient Name: Date of Service: William Albright HN H. 06/13/2023 2:45 PM Medical Record Number: [id number] Patient Account Number: [id number] Date of Birth/Sex: Treating RN: 03-22-1951 (72 y.o. [name] Primary Care [name]: [name] Other Clinician: Betha [name] Referring [name]: Treating [name]/Extender: [name], [name] in Treatment: 73 Active Inactive Wound/Skin Impairment Nursing Diagnoses: Impaired tissue integrity Knowledge deficit related to ulceration/compromised skin integrity Goals: Patient/caregiver will verbalize understanding of skin care regimen Date Initiated: 02/01/2023 Date Inactivated: 02/28/2023 Target Resolution Date: 03/04/2023 Goal Status: Met Ulcer/skin breakdown will have a volume reduction of 30% by week 4 Date Initiated: 02/01/2023 Date Inactivated: 02/28/2023 Target Resolution Date: 03/04/2023 Goal Status: Met Ulcer/skin breakdown will have a volume reduction of 50% by week 8 Date Initiated: 02/01/2023 Date Inactivated: 04/11/2023 Target Resolution Date: 04/04/2023 Goal Status: Met Ulcer/skin breakdown will have a volume reduction of 80% by week 12 Date Initiated: 02/01/2023 Target Resolution Date: 07/05/2023 Goal Status: Active Interventions: Assess patient/caregiver ability to obtain necessary supplies Assess patient/caregiver ability to perform ulcer/skin care regimen upon admission and as needed Assess ulceration(s) every visit Provide education on ulcer and skin care Treatment Activities: Referred to DME [name] for dressing supplies :  01/31/2023 Skin care regimen initiated : 01/31/2023 Notes: Electronic Signature(s) Signed: 06/13/2023 4:35:53 PM By: [name] ([id number]) [id number] Page 6 of 11 Signed: 06/15/2023 5:31:34 PM By: William Aver MSN RN CNS WTA Entered By: [name] on 06/13/2023 15:37:47 -------------------------------------------------------------------------------- Pain Assessment Details Patient Name: Date of Service: William Albright HN H. 06/13/2023 2:45 PM Medical Record Number: [id number] Patient Account Number: [id number] Date of Birth/Sex: Treating RN: Sep 22, 1950 (72 y.o. [name] Primary Care [name]: [name] Other Clinician: Betha [name] Referring Christiane [name]: Treating [name]: [name], [name] in Treatment: 19 Active Problems Location of Pain Severity and Description of Pain Patient Has Paino No Site Locations Pain Management and Medication Current Pain Management: Electronic Signature(s) Signed: 06/13/2023 4:35:53 PM By: [name] Signed: 06/15/2023 5:31:34 PM By: William Aver MSN RN CNS WTA Entered By: [name] on 06/13/2023 15:02:44 -------------------------------------------------------------------------------- Patient/Caregiver Education Details Patient Name: Date of Service: William Albright HN  [name], [name] ([id number]) [id number] Page 1 of 11 Visit Report for 06/13/2023 Arrival Information Details Patient Name: Date of Service: [name] 06/13/2023 2:45 PM Medical Record Number: [id number] Patient Account Number: [id number] Date of Birth/Sex: Treating RN: 1950-09-25 (72 y.o. [name] Primary Care Jakhai [name]: [name] Other Clinician: Betha [name] Referring [name]: Treating [name]: [name], [name] in Treatment: 19 Visit Information History Since Last Visit All ordered tests and consults were completed: No Patient Arrived: [name] Added or deleted any medications: No Arrival Time: 14:55 Any new allergies or adverse reactions: No Transfer Assistance: None Had a fall or experienced change in No Patient Identification Verified: Yes activities of daily living that may affect Secondary Verification Process Completed: Yes risk of falls: Patient Requires Transmission-Based Precautions: No Signs or symptoms of abuse/neglect since last visito No Patient Has Alerts: Yes Hospitalized since last visit: No Patient Alerts: Patient on Blood Thinner Implantable device outside of the clinic excluding No Diabetes type 2 cellular tissue based products placed in the center Eliquis/Plavix since last visit: ABI R 1.23 TBI 0.57 Has Dressing in Place as Prescribed: Yes ABI L 0.98 TBI 0.80 Pain Present Now: No Electronic Signature(s) Signed: 06/13/2023 4:35:53 PM By: [name] Entered By: [name] on 06/13/2023 14:58:33 -------------------------------------------------------------------------------- Clinic Level of Care Assessment Details Patient Name: Date of Service: [name] 06/13/2023 2:45 PM Medical Record Number: [id number] Patient Account Number: [id number] Date of Birth/Sex: Treating RN: [birth year] (72 y.o. [name] Primary Care Sofia Vanmeter: [name] Other Clinician: Betha [name] Referring Wilhelm Ganaway: Treating Janard Culp/Extender: [name], [name] in Treatment: 19 Clinic Level of Care Assessment Items TOOL 4 Quantity Score []  - 0 Use when only an EandM is performed on FOLLOW-UP visit ASSESSMENTS - Nursing Assessment / Reassessment X- 1 10 Reassessment of Co-morbidities (includes updates in patient status) X- 1 5 Reassessment of Adherence to Treatment Plan [name], [name] ([id number]) [id number] Page 2 of 11 ASSESSMENTS - Wound and Skin A ssessment / Reassessment []  - 0 Simple Wound Assessment / Reassessment - one wound X- 3 5 Complex Wound Assessment / Reassessment - multiple wounds []  - 0 Dermatologic / Skin Assessment (not related to wound area) ASSESSMENTS - Focused Assessment []  - 0 Circumferential Edema Measurements - multi extremities []  - 0 Nutritional Assessment / Counseling / Intervention []  - 0 Lower Extremity Assessment (monofilament, tuning fork, pulses) []  - 0 Peripheral Arterial Disease Assessment (using hand held doppler) ASSESSMENTS - Ostomy and/or Continence Assessment and Care []  - 0 Incontinence Assessment and Management []  - 0 Ostomy Care Assessment and Management (repouching, etc.) PROCESS - Coordination of Care X - Simple Patient / Family Education for ongoing care 1 15 []  - 0 Complex (extensive) Patient / Family Education for ongoing care []  - 0 Staff obtains Chiropractor, Records, T Results / Process Orders est []  - 0 Staff telephones HHA, Nursing Homes / Clarify orders / etc []  - 0 Routine Transfer to another Facility (non-emergent condition) []  - 0 Routine Hospital Admission (non-emergent condition) []  - 0 New Admissions / Manufacturing engineer / Ordering NPWT Apligraf, etc. , []  - 0 Emergency Hospital Admission (emergent condition) X- 1 10 Simple Discharge Coordination []  - 0 Complex (extensive) Discharge Coordination PROCESS - Special Needs []  - 0 Pediatric / Minor  Patient Management []  - 0 Isolation Patient Management []  - 0 Hearing / Language / Visual special needs []  - 0 Assessment of Community assistance (transportation, D/C planning, etc.) []  -  [name], [name] ([id number]) [id number] Page 1 of 11 Visit Report for 06/13/2023 Arrival Information Details Patient Name: Date of Service: [name] 06/13/2023 2:45 PM Medical Record Number: [id number] Patient Account Number: [id number] Date of Birth/Sex: Treating RN: 1950-09-25 (72 y.o. [name] Primary Care Jakhai [name]: [name] Other Clinician: Betha [name] Referring [name]: Treating [name]: [name], [name] in Treatment: 19 Visit Information History Since Last Visit All ordered tests and consults were completed: No Patient Arrived: [name] Added or deleted any medications: No Arrival Time: 14:55 Any new allergies or adverse reactions: No Transfer Assistance: None Had a fall or experienced change in No Patient Identification Verified: Yes activities of daily living that may affect Secondary Verification Process Completed: Yes risk of falls: Patient Requires Transmission-Based Precautions: No Signs or symptoms of abuse/neglect since last visito No Patient Has Alerts: Yes Hospitalized since last visit: No Patient Alerts: Patient on Blood Thinner Implantable device outside of the clinic excluding No Diabetes type 2 cellular tissue based products placed in the center Eliquis/Plavix since last visit: ABI R 1.23 TBI 0.57 Has Dressing in Place as Prescribed: Yes ABI L 0.98 TBI 0.80 Pain Present Now: No Electronic Signature(s) Signed: 06/13/2023 4:35:53 PM By: [name] Entered By: [name] on 06/13/2023 14:58:33 -------------------------------------------------------------------------------- Clinic Level of Care Assessment Details Patient Name: Date of Service: [name] 06/13/2023 2:45 PM Medical Record Number: [id number] Patient Account Number: [id number] Date of Birth/Sex: Treating RN: [birth year] (72 y.o. [name] Primary Care Sofia Vanmeter: [name] Other Clinician: Betha [name] Referring Wilhelm Ganaway: Treating Janard Culp/Extender: [name], [name] in Treatment: 19 Clinic Level of Care Assessment Items TOOL 4 Quantity Score []  - 0 Use when only an EandM is performed on FOLLOW-UP visit ASSESSMENTS - Nursing Assessment / Reassessment X- 1 10 Reassessment of Co-morbidities (includes updates in patient status) X- 1 5 Reassessment of Adherence to Treatment Plan [name], [name] ([id number]) [id number] Page 2 of 11 ASSESSMENTS - Wound and Skin A ssessment / Reassessment []  - 0 Simple Wound Assessment / Reassessment - one wound X- 3 5 Complex Wound Assessment / Reassessment - multiple wounds []  - 0 Dermatologic / Skin Assessment (not related to wound area) ASSESSMENTS - Focused Assessment []  - 0 Circumferential Edema Measurements - multi extremities []  - 0 Nutritional Assessment / Counseling / Intervention []  - 0 Lower Extremity Assessment (monofilament, tuning fork, pulses) []  - 0 Peripheral Arterial Disease Assessment (using hand held doppler) ASSESSMENTS - Ostomy and/or Continence Assessment and Care []  - 0 Incontinence Assessment and Management []  - 0 Ostomy Care Assessment and Management (repouching, etc.) PROCESS - Coordination of Care X - Simple Patient / Family Education for ongoing care 1 15 []  - 0 Complex (extensive) Patient / Family Education for ongoing care []  - 0 Staff obtains Chiropractor, Records, T Results / Process Orders est []  - 0 Staff telephones HHA, Nursing Homes / Clarify orders / etc []  - 0 Routine Transfer to another Facility (non-emergent condition) []  - 0 Routine Hospital Admission (non-emergent condition) []  - 0 New Admissions / Manufacturing engineer / Ordering NPWT Apligraf, etc. , []  - 0 Emergency Hospital Admission (emergent condition) X- 1 10 Simple Discharge Coordination []  - 0 Complex (extensive) Discharge Coordination PROCESS - Special Needs []  - 0 Pediatric / Minor  Patient Management []  - 0 Isolation Patient Management []  - 0 Hearing / Language / Visual special needs []  - 0 Assessment of Community assistance (transportation, D/C planning, etc.) []  -  Congestive Heart Failure, Coronary Comorbid History: Artery Disease, Hypertension, Type II Artery Disease, Hypertension, Type II Artery Disease, Hypertension, Type II Diabetes, Neuropathy Diabetes, Neuropathy Diabetes, Neuropathy 12/10/2022 12/10/2022 12/10/2022 Date Acquired: 19 19 19  [name] of Treatment: Open Open Open Wound Status: No No No Wound Recurrence: Yes No No Clustered Wound: 1x0.3x0.1 1.2x1.5x0.2 0.5x0.4x0.2 Measurements L x W x D (cm) 0.236 1.414 0.157 A (cm) : rea 0.024 0.283 0.031 Volume (cm) : 94.50% -63.70% 93.80% % Reduction in A rea: 94.40% -229.10% 87.60% % Reduction in Volume: Grade 1 Grade 1 Grade 1 Classification: None Present None Present Medium Exudate A mount: N/A N/A Serosanguineous Exudate Type: N/A N/A red, brown Exudate Color: Medium (34-66%) Medium (34-66%) Medium (34-66%) Granulation A mount: Red Red, Pink Red Granulation Quality: None Present (0%) None Present (0%) Medium (34-66%) Necrotic A mountHEATON, [name] ([id number]) [id number] Page 5 of 11 Fat Layer (Subcutaneous Tissue): Yes Fat Layer (Subcutaneous Tissue): Yes Fat Layer (Subcutaneous Tissue): Yes Exposed Structures: Fascia: No Fascia: No Fascia: No Tendon: No Tendon: No Tendon: No Muscle: No Muscle: No Muscle:  No Joint: No Joint: No Joint: No Bone: No Bone: No Bone: No Large (67-100%) Large (67-100%) Small (1-33%) Epithelialization: Treatment Notes Electronic Signature(s) Signed: 06/13/2023 4:35:53 PM By: [name] Entered By: [name] on 06/13/2023 15:11:58 -------------------------------------------------------------------------------- Multi-Disciplinary Care Plan Details Patient Name: Date of Service: William Albright HN H. 06/13/2023 2:45 PM Medical Record Number: [id number] Patient Account Number: [id number] Date of Birth/Sex: Treating RN: 03-22-1951 (72 y.o. [name] Primary Care [name]: [name] Other Clinician: Betha [name] Referring [name]: Treating [name]/Extender: [name], [name] in Treatment: 73 Active Inactive Wound/Skin Impairment Nursing Diagnoses: Impaired tissue integrity Knowledge deficit related to ulceration/compromised skin integrity Goals: Patient/caregiver will verbalize understanding of skin care regimen Date Initiated: 02/01/2023 Date Inactivated: 02/28/2023 Target Resolution Date: 03/04/2023 Goal Status: Met Ulcer/skin breakdown will have a volume reduction of 30% by week 4 Date Initiated: 02/01/2023 Date Inactivated: 02/28/2023 Target Resolution Date: 03/04/2023 Goal Status: Met Ulcer/skin breakdown will have a volume reduction of 50% by week 8 Date Initiated: 02/01/2023 Date Inactivated: 04/11/2023 Target Resolution Date: 04/04/2023 Goal Status: Met Ulcer/skin breakdown will have a volume reduction of 80% by week 12 Date Initiated: 02/01/2023 Target Resolution Date: 07/05/2023 Goal Status: Active Interventions: Assess patient/caregiver ability to obtain necessary supplies Assess patient/caregiver ability to perform ulcer/skin care regimen upon admission and as needed Assess ulceration(s) every visit Provide education on ulcer and skin care Treatment Activities: Referred to DME [name] for dressing supplies :  01/31/2023 Skin care regimen initiated : 01/31/2023 Notes: Electronic Signature(s) Signed: 06/13/2023 4:35:53 PM By: [name] ([id number]) [id number] Page 6 of 11 Signed: 06/15/2023 5:31:34 PM By: William Aver MSN RN CNS WTA Entered By: [name] on 06/13/2023 15:37:47 -------------------------------------------------------------------------------- Pain Assessment Details Patient Name: Date of Service: William Albright HN H. 06/13/2023 2:45 PM Medical Record Number: [id number] Patient Account Number: [id number] Date of Birth/Sex: Treating RN: Sep 22, 1950 (72 y.o. [name] Primary Care [name]: [name] Other Clinician: Betha [name] Referring Christiane [name]: Treating [name]: [name], [name] in Treatment: 19 Active Problems Location of Pain Severity and Description of Pain Patient Has Paino No Site Locations Pain Management and Medication Current Pain Management: Electronic Signature(s) Signed: 06/13/2023 4:35:53 PM By: [name] Signed: 06/15/2023 5:31:34 PM By: William Aver MSN RN CNS WTA Entered By: [name] on 06/13/2023 15:02:44 -------------------------------------------------------------------------------- Patient/Caregiver Education Details Patient Name: Date of Service: William Albright HN  Congestive Heart Failure, Coronary Comorbid History: Artery Disease, Hypertension, Type II Artery Disease, Hypertension, Type II Artery Disease, Hypertension, Type II Diabetes, Neuropathy Diabetes, Neuropathy Diabetes, Neuropathy 12/10/2022 12/10/2022 12/10/2022 Date Acquired: 19 19 19  [name] of Treatment: Open Open Open Wound Status: No No No Wound Recurrence: Yes No No Clustered Wound: 1x0.3x0.1 1.2x1.5x0.2 0.5x0.4x0.2 Measurements L x W x D (cm) 0.236 1.414 0.157 A (cm) : rea 0.024 0.283 0.031 Volume (cm) : 94.50% -63.70% 93.80% % Reduction in A rea: 94.40% -229.10% 87.60% % Reduction in Volume: Grade 1 Grade 1 Grade 1 Classification: None Present None Present Medium Exudate A mount: N/A N/A Serosanguineous Exudate Type: N/A N/A red, brown Exudate Color: Medium (34-66%) Medium (34-66%) Medium (34-66%) Granulation A mount: Red Red, Pink Red Granulation Quality: None Present (0%) None Present (0%) Medium (34-66%) Necrotic A mountHEATON, [name] ([id number]) [id number] Page 5 of 11 Fat Layer (Subcutaneous Tissue): Yes Fat Layer (Subcutaneous Tissue): Yes Fat Layer (Subcutaneous Tissue): Yes Exposed Structures: Fascia: No Fascia: No Fascia: No Tendon: No Tendon: No Tendon: No Muscle: No Muscle: No Muscle:  No Joint: No Joint: No Joint: No Bone: No Bone: No Bone: No Large (67-100%) Large (67-100%) Small (1-33%) Epithelialization: Treatment Notes Electronic Signature(s) Signed: 06/13/2023 4:35:53 PM By: [name] Entered By: [name] on 06/13/2023 15:11:58 -------------------------------------------------------------------------------- Multi-Disciplinary Care Plan Details Patient Name: Date of Service: William Albright HN H. 06/13/2023 2:45 PM Medical Record Number: [id number] Patient Account Number: [id number] Date of Birth/Sex: Treating RN: 03-22-1951 (72 y.o. [name] Primary Care [name]: [name] Other Clinician: Betha [name] Referring [name]: Treating [name]/Extender: [name], [name] in Treatment: 73 Active Inactive Wound/Skin Impairment Nursing Diagnoses: Impaired tissue integrity Knowledge deficit related to ulceration/compromised skin integrity Goals: Patient/caregiver will verbalize understanding of skin care regimen Date Initiated: 02/01/2023 Date Inactivated: 02/28/2023 Target Resolution Date: 03/04/2023 Goal Status: Met Ulcer/skin breakdown will have a volume reduction of 30% by week 4 Date Initiated: 02/01/2023 Date Inactivated: 02/28/2023 Target Resolution Date: 03/04/2023 Goal Status: Met Ulcer/skin breakdown will have a volume reduction of 50% by week 8 Date Initiated: 02/01/2023 Date Inactivated: 04/11/2023 Target Resolution Date: 04/04/2023 Goal Status: Met Ulcer/skin breakdown will have a volume reduction of 80% by week 12 Date Initiated: 02/01/2023 Target Resolution Date: 07/05/2023 Goal Status: Active Interventions: Assess patient/caregiver ability to obtain necessary supplies Assess patient/caregiver ability to perform ulcer/skin care regimen upon admission and as needed Assess ulceration(s) every visit Provide education on ulcer and skin care Treatment Activities: Referred to DME [name] for dressing supplies :  01/31/2023 Skin care regimen initiated : 01/31/2023 Notes: Electronic Signature(s) Signed: 06/13/2023 4:35:53 PM By: [name] ([id number]) [id number] Page 6 of 11 Signed: 06/15/2023 5:31:34 PM By: William Aver MSN RN CNS WTA Entered By: [name] on 06/13/2023 15:37:47 -------------------------------------------------------------------------------- Pain Assessment Details Patient Name: Date of Service: William Albright HN H. 06/13/2023 2:45 PM Medical Record Number: [id number] Patient Account Number: [id number] Date of Birth/Sex: Treating RN: Sep 22, 1950 (72 y.o. [name] Primary Care [name]: [name] Other Clinician: Betha [name] Referring Christiane [name]: Treating [name]: [name], [name] in Treatment: 19 Active Problems Location of Pain Severity and Description of Pain Patient Has Paino No Site Locations Pain Management and Medication Current Pain Management: Electronic Signature(s) Signed: 06/13/2023 4:35:53 PM By: [name] Signed: 06/15/2023 5:31:34 PM By: William Aver MSN RN CNS WTA Entered By: [name] on 06/13/2023 15:02:44 -------------------------------------------------------------------------------- Patient/Caregiver Education Details Patient Name: Date of Service: William Albright HN  0 Additional assistance / Altered mentation []  - 0 Support Surface(s) Assessment (bed, cushion, seat, etc.) INTERVENTIONS - Wound Cleansing / Measurement []  - 0 Simple Wound Cleansing - one wound X- 3 5 Complex Wound Cleansing - multiple wounds X- 1 5 Wound Imaging (photographs - any number of wounds) []  - 0 Wound Tracing (instead of photographs) []  - 0 Simple Wound Measurement - one wound X- 3 5 Complex Wound Measurement - multiple wounds INTERVENTIONS - Wound Dressings []  - 0 Small Wound Dressing one or multiple wounds X- 3 15 Medium Wound Dressing one or multiple wounds []  - 0 Large Wound Dressing one or multiple wounds []  - 0 Application of Medications - topical []  - 0 Application of Medications - injection INTERVENTIONS - Miscellaneous []  - 0 External ear exam [name], [name] ([id number]) [id number] Page 3 of 11 []  - 0 Specimen Collection (cultures, biopsies, blood, body fluids, etc.) []  - 0 Specimen(s) / Culture(s) sent or taken to Lab for analysis []  - 0 Patient Transfer (multiple staff / Michiel Sites Lift / Similar devices) []  - 0 Simple Staple / Suture removal (25 or less) []  - 0 Complex Staple / Suture removal (26 or more) []  - 0 Hypo / Hyperglycemic Management (close monitor of Blood Glucose) []  - 0 Ankle / Brachial Index (ABI) - do not check if billed separately X- 1 5 Vital Signs Has the patient been seen at the hospital within the last three years: Yes Total Score: 140 Level Of Care: New/Established - Level 4 Electronic Signature(s) Signed: 06/13/2023 4:35:53 PM By: [name] Entered By: [name] on 06/13/2023 15:37:34 -------------------------------------------------------------------------------- Encounter Discharge  Information Details Patient Name: Date of Service: William Albright HN H. 06/13/2023 2:45 PM Medical Record Number: [id number] Patient Account Number: [id number] Date of Birth/Sex: Treating RN: 03/16/1951 (72 y.o. [name] Primary Care Tyrianna Lightle: [name] Other Clinician: Betha [name] Referring Jaci Desanto: Treating Annakate Soulier/Extender: [name], [name] in Treatment: 62 Encounter Discharge Information Items Discharge Condition: Stable Ambulatory Status: Cane Discharge Destination: Home Transportation: Private Auto Accompanied By: self Schedule Follow-up Appointment: Yes Clinical Summary of Care: Electronic Signature(s) Signed: 06/13/2023 4:35:53 PM By: [name] Entered By: [name] on 06/13/2023 15:52:00 Lower Extremity Assessment Details -------------------------------------------------------------------------------- [name] ([id number]) [id number] Page 4 of 11 Patient Name: Date of Service: [name] 06/13/2023 2:45 PM Medical Record Number: [id number] Patient Account Number: [id number] Date of Birth/Sex: Treating RN: Jun 16, 1951 (72 y.o. [name] Primary Care Reiley Bertagnolli: [name] Other Clinician: Betha [name] Referring Taitum Menton: Treating Ruairi Stutsman/Extender: [name], [name] in Treatment: 19 Electronic Signature(s) Signed: 06/13/2023 4:35:53 PM By: [name] Signed: 06/15/2023 5:31:34 PM By: William Aver MSN RN CNS WTA Entered By: [name] on 06/13/2023 15:11:54 -------------------------------------------------------------------------------- Multi Wound Chart Details Patient Name: Date of Service: William Albright HN H. 06/13/2023 2:45 PM Medical Record Number: [id number] Patient Account Number: [id number] Date of Birth/Sex: Treating RN: [birth year] (72 y.o. [name] Primary Care Harmony Sandell: [name] Other Clinician: Betha [name] Referring Ronae Noell: Treating  Shreyansh Tiffany/Extender: [name], [name] in Treatment: 39 Vital Signs Height(in): 66 Pulse(bpm): 82 Weight(lbs): 250 Blood Pressure(mmHg): 121/67 Body Mass Index(BMI): 40.3 Temperature(F): 98.0 Respiratory Rate(breaths/min): 18 [1R:Photos:] Right Calcaneus Right, Distal, Dorsal Foot Right, Proximal, Dorsal Foot Wound Location: Gradually Appeared Gradually Appeared Gradually Appeared Wounding Event: Diabetic Wound/Ulcer of the Lower Diabetic Wound/Ulcer of the Lower Diabetic Wound/Ulcer of the Lower Primary Etiology: Extremity Extremity Extremity Congestive Heart Failure, Coronary Congestive Heart Failure, Coronary

## 2023-06-19 ENCOUNTER — Encounter: Payer: Self-pay | Admitting: Family

## 2023-06-19 ENCOUNTER — Telehealth: Payer: Self-pay | Admitting: Family Medicine

## 2023-06-19 ENCOUNTER — Ambulatory Visit: Payer: PPO | Attending: Family | Admitting: Family

## 2023-06-19 VITALS — BP 130/63 | HR 90 | Wt 256.0 lb

## 2023-06-19 DIAGNOSIS — I5022 Chronic systolic (congestive) heart failure: Secondary | ICD-10-CM | POA: Diagnosis not present

## 2023-06-19 DIAGNOSIS — I739 Peripheral vascular disease, unspecified: Secondary | ICD-10-CM

## 2023-06-19 DIAGNOSIS — E11319 Type 2 diabetes mellitus with unspecified diabetic retinopathy without macular edema: Secondary | ICD-10-CM | POA: Diagnosis not present

## 2023-06-19 DIAGNOSIS — N189 Chronic kidney disease, unspecified: Secondary | ICD-10-CM | POA: Insufficient documentation

## 2023-06-19 DIAGNOSIS — Z7901 Long term (current) use of anticoagulants: Secondary | ICD-10-CM | POA: Insufficient documentation

## 2023-06-19 DIAGNOSIS — I480 Paroxysmal atrial fibrillation: Secondary | ICD-10-CM | POA: Diagnosis not present

## 2023-06-19 DIAGNOSIS — I255 Ischemic cardiomyopathy: Secondary | ICD-10-CM | POA: Diagnosis not present

## 2023-06-19 DIAGNOSIS — I2510 Atherosclerotic heart disease of native coronary artery without angina pectoris: Secondary | ICD-10-CM | POA: Diagnosis not present

## 2023-06-19 DIAGNOSIS — Z7902 Long term (current) use of antithrombotics/antiplatelets: Secondary | ICD-10-CM | POA: Diagnosis present

## 2023-06-19 DIAGNOSIS — E1122 Type 2 diabetes mellitus with diabetic chronic kidney disease: Secondary | ICD-10-CM | POA: Diagnosis not present

## 2023-06-19 DIAGNOSIS — Z794 Long term (current) use of insulin: Secondary | ICD-10-CM | POA: Diagnosis not present

## 2023-06-19 DIAGNOSIS — E1151 Type 2 diabetes mellitus with diabetic peripheral angiopathy without gangrene: Secondary | ICD-10-CM | POA: Diagnosis not present

## 2023-06-19 DIAGNOSIS — I252 Old myocardial infarction: Secondary | ICD-10-CM | POA: Diagnosis present

## 2023-06-19 DIAGNOSIS — I4819 Other persistent atrial fibrillation: Secondary | ICD-10-CM

## 2023-06-19 DIAGNOSIS — I130 Hypertensive heart and chronic kidney disease with heart failure and stage 1 through stage 4 chronic kidney disease, or unspecified chronic kidney disease: Secondary | ICD-10-CM | POA: Insufficient documentation

## 2023-06-19 DIAGNOSIS — Z955 Presence of coronary angioplasty implant and graft: Secondary | ICD-10-CM | POA: Diagnosis present

## 2023-06-19 DIAGNOSIS — I5020 Unspecified systolic (congestive) heart failure: Secondary | ICD-10-CM

## 2023-06-19 DIAGNOSIS — I110 Hypertensive heart disease with heart failure: Secondary | ICD-10-CM

## 2023-06-19 NOTE — Telephone Encounter (Signed)
[name] called in and stated that they are needing the notes for the patient to state that he needs a motor scooter and not a wheelchair. The prescription was fine but because the notes say wheelchair they aren't going to do it. This can be faxed over to [facility] [phone]. Thank you!

## 2023-06-19 NOTE — Telephone Encounter (Signed)
Spoke with [name].  He is now trying to get a scooter through a different company Emergency planning/management officer).  They need office notes to say scooter and not electric wheelchair.

## 2023-06-19 NOTE — Progress Notes (Signed)
PCP: [name], MD (last seen 10/24) Primary Cardiologist: Julien Nordmann, MD (last seen 10/24)  HPI:  [name] is a 72 y/o male with a history of NSTEMI 04/17 (PCI to LAD; was discharged on Plavix) and again 02/2016 (no PCI; was discharged on Brilinta), Again presented to [facility] ER on 04/05/16 for NSTEMI (no PCI), chronic total occlusion of RCA, DM, hyperlipidemia, HTN, CKD, depression and chronic heart failure. Due to paroxysmal atrial fibrillation, was successfully cardioverted 03/03/23.   Admitted 11/05/22 due to nausea, vomiting and dyspnea for the last 3 days. Noted to have fever and tested covid +. CTA negative for PE but showed heart failure. 1 dose of IV lasix given. Started on remdesivir, Decadron and bronchodilators.   Echo 07/02/07: EF 60% Echo 03/09/16: EF 55-60% with Grade II DD Echo 04/01/20: EF 60-65% with mild LVH, Grade I DD, normal PA pressure, mild LAE/RAE Echo 11/06/22: EF 40-45% along with mild/moderate MR  LHC 04/06/16:  Mid RCA to Dist RCA lesion, 100 %stenosed. Mid LAD-2 lesion, 60 %stenosed. Mid LAD-1 lesion, 20 %stenosed. The left ventricular systolic function is normal. The left ventricular ejection fraction is 50-55% by visual estimate. LV end diastolic pressure is normal. 2nd [name] lesion, 75 %stenosed.  Chronic total occlusion of the right coronary in the proximal segment well collateralized from the left circumflex and LAD. Widely patent proximal to mid LAD stent previously placed in July. There is first diagonal diagonal and LAD 30 and 50% narrowing respectively. Widely patent circumflex unchanged from previous with 70% narrowing in a small branch of the first marginal. Circumflex collateralizes the distal right coronary left ventricular branch. Inferobasal hypokinesis. EF 50%. EDP is normal.   He presents today for a HF f/u visit with a chief complaint of moderate fatigue with minimal exertion. Has associated dry cough which is worse when laying down at night. He  also notes dizziness at time with sudden position changes. Denies any shortness of breath, chest pain, palpitations, abdominal distention, pedal edema, weight gain or difficulty sleeping.   Glucose 152 in the office after eating 2 candy bars & a sandwich in the car because it had gotten low in the 50's because he hasn't eaten anything but a banana today.   Has received his flu vaccine for this season.   ROS: All systems negative except as listed in HPI, PMH and Problem List.  SH:  Social History   Socioeconomic History   Marital status: Widowed    Spouse name: Not on file   Number of children: 3   Years of education: Not on file   Highest education level: 7th grade  Occupational History   Occupation: disabled    Associate Professor: UNEMPLOYED    Comment: back injury  Tobacco Use   Smoking status: Never   Smokeless tobacco: Never  Vaping Use   Vaping status: Never Used  Substance and Sexual Activity   Alcohol use: No    Alcohol/week: 0.0 standard drinks of alcohol   Drug use: No   Sexual activity: Not Currently  Other Topics Concern   Not on file  Social History Narrative   Has a roommate, Mr. [name]. No pets.   Social Determinants of Health   Financial Resource Strain: Low Risk  (11/23/2022)   Overall Financial Resource Strain (CARDIA)    Difficulty of Paying Living Expenses: Not hard at all  Food Insecurity: No Food Insecurity (12/29/2022)   Hunger Vital Sign    Worried About Running Out of Food in the  Last Year: Never true    Ran Out of Food in the Last Year: Never true  Transportation Needs: No Transportation Needs (12/29/2022)   PRAPARE - Administrator, Civil Service (Medical): No    Lack of Transportation (Non-Medical): No  Physical Activity: Inactive (11/09/2022)   Exercise Vital Sign    Days of Exercise per Week: 0 days    Minutes of Exercise per Session: 0 min  Stress: No Stress Concern Present (11/09/2022)   [employer] of Occupational Health -  Occupational Stress Questionnaire    Feeling of Stress : Only a little  Social Connections: Moderately Isolated (11/09/2022)   Social Connection and Isolation Panel [NHANES]    Frequency of Communication with Friends and Family: More than three times a week    Frequency of Social Gatherings with Friends and Family: More than three times a week    Attends Religious Services: More than 4 times per year    Active Member of [employer] or Organizations: No    Attends Banker Meetings: Never    Marital Status: Widowed  Intimate Partner Violence: Not At Risk (11/09/2022)   Humiliation, Afraid, Rape, and Kick questionnaire    Fear of Current or Ex-Partner: No    Emotionally Abused: No    Physically Abused: No    Sexually Abused: No    FH:  Family History  Problem Relation Age of Onset   Alzheimer's disease Mother    Emphysema Mother    Diabetes Father    Heart disease Father        MI   Cancer Brother        ? Neck cancer    Past Medical History:  Diagnosis Date   Back injury 02/2002   worker's comp   CHF (congestive heart failure) (HCC)    Coronary artery disease, non-occlusive    a. cath 2002 with no sig CAD;  b. cath 2008 normal LM, LAD, LCx, p&dRCA 20-30%, PDA 30%; c.11/2015 NSTEMI/PCI: LM nl, LAD 95p (2.5x15 Xience DES), LCX nl, RCA 100p/m w/ L->R collats, EF 55-65% c. NSTEMI (02/2016) with no culprit leision, switched to Brilinta.  d. NSTEMI 03/2016: again, no culprit lesion and switched back to plavix 2/2 SOB with Brilnta.     Depression    Diabetes mellitus type 2, insulin dependent (HCC)    Hyperlipemia    Hypertension    Hypertensive heart disease    Kidney stones    Macular degeneration    Morbid obesity (HCC)    Osteoarthritis    Snoring     Current Outpatient Medications  Medication Sig Dispense Refill   Acetaminophen (TYLENOL PO) Take 3-4 tablets by mouth as needed (pain).     apixaban (ELIQUIS) 5 MG TABS tablet Take 1 tablet (5 mg total) by mouth 2 (two)  times daily. 56 tablet 0   carvedilol (COREG) 6.25 MG tablet Take 1 tablet (6.25 mg total) by mouth 2 (two) times daily with a meal. 90 tablet 3   cholecalciferol (VITAMIN D3) 25 MCG (1000 UNIT) tablet Take 2,000 Units by mouth daily.     clopidogrel (PLAVIX) 75 MG tablet TAKE ONE TABLET BY MOUTH ONCE DAILY WITH BREAKFAST 90 tablet 3   Continuous Blood Gluc Sensor (FREESTYLE LIBRE 2 SENSOR) MISC APPLY SENSOR EVERY 14 DAYS TO MONITOR SUGAR CONTINOUSLY 2 each 11   Continuous Glucose Receiver (FREESTYLE LIBRE 2 READER) DEVI USE WITH SENSORS TO MONITOR SUGAR CONTINUOUSLY 1 each 0   Dulaglutide (TRULICITY) 4.5  MG/0.5ML SOPN Inject 4.5 mg into the skin once a week. Via [employer] PAP     ENTRESTO 24-26 MG TAKE ONE TABLET BY MOUTH TWO (TWO) TIMES DAILY. 60 tablet 3   ezetimibe (ZETIA) 10 MG tablet Take 1 tablet (10 mg total) by mouth daily. 90 tablet 3   furosemide (LASIX) 40 MG tablet Take 40 mg by mouth daily.     gabapentin (NEURONTIN) 100 MG capsule Take 1 capsule (100 mg total) by mouth at bedtime. 30 capsule 3   HYDROcodone-acetaminophen (NORCO/VICODIN) 5-325 MG tablet Take 1 tablet by mouth 3 (three) times daily as needed for moderate pain. 90 tablet 0   hydrOXYzine (ATARAX) 10 MG tablet TAKE 1 TABLET BY MOUTH 3 TIMES DAILY AS NEEDED FOR ANXIETY 30 tablet 2   insulin aspart (NOVOLOG FLEXPEN) 100 UNIT/ML FlexPen Inject 13 Units into the skin every morning.     isosorbide mononitrate (IMDUR) 30 MG 24 hr tablet Take 1 tablet (30 mg total) by mouth 2 (two) times daily. 60 tablet 2   JARDIANCE 10 MG TABS tablet TAKE ONE TABLET (10 MG TOTAL) BY MOUTH DAILY. 30 tablet 5   nitroGLYCERIN (NITROSTAT) 0.4 MG SL tablet DISSOLVE 1 TABLET UNDER TONGUE AS NEEDEDFOR CHEST PAIN. MAY REPEAT 5 MINUTES APART 3 TIMES IF NEEDED 25 tablet 3   spironolactone (ALDACTONE) 25 MG tablet TAKE ONE TABLET (25 MG TOTAL) BY MOUTH DAILY. 30 tablet 5   TRESIBA FLEXTOUCH 100 UNIT/ML FlexTouch Pen INJECT 50 UNITS INTO THE SKIN DAILY  (Patient not taking: Reported on 05/30/2023) 15 mL 2   TRUEPLUS 5-BEVEL PEN NEEDLES 31G X 6 MM MISC USE TO INJECT INSULIN THREE TIMES A DAY (Patient not taking: Reported on 05/30/2023) 300 each 3   venlafaxine XR (EFFEXOR-XR) 150 MG 24 hr capsule TAKE ONE CAPSULE BY MOUTH DAILY WITH BREAKFAST. TAKE WITH EFFEXOR XR 75MG  FOR A TOTAL OF 225MG  90 capsule 0   venlafaxine XR (EFFEXOR-XR) 75 MG 24 hr capsule TAKE ONE CAPSULE BY MOUTH ONCE DAILY. TAKE IN ADDITION TO THE 150 MG CAPSULE FOR A TOTAL DOSE OF 225MG  DAILY 90 capsule 0   vitamin B-12 (CYANOCOBALAMIN) 1000 MCG tablet Take 1,000 mcg by mouth daily.     No current facility-administered medications for this visit.   Vitals:   06/19/23 1544  BP: 130/63  Pulse: 90  SpO2: 100%  Weight: 256 lb (116.1 kg)   Wt Readings from Last 3 Encounters:  06/19/23 256 lb (116.1 kg)  06/08/23 253 lb 9.6 oz (115 kg)  06/01/23 252 lb (114.3 kg)   Lab Results  Component Value Date   CREATININE 1.15 05/18/2023   CREATININE 1.16 05/09/2023   CREATININE 0.99 03/27/2023   PHYSICAL EXAM:  General:  Well appearing. No resp difficulty HEENT: normal Neck: supple. JVP flat. No lymphadenopathy or thryomegaly appreciated. Cor: PMI normal. Regular rate, regular rhythm. No rubs, gallops or murmurs. Lungs: clear Abdomen: soft, nontender, nondistended. No hepatosplenomegaly. No bruits or masses.  Extremities: no cyanosis, clubbing, rash, trace pitting edema right lower leg Neuro: alert & oriented x3, cranial nerves grossly intact. Moves all 4 extremities w/o difficulty. Affect pleasant.   ECG: not done   ASSESSMENT & PLAN:  1: Ischemic cardiomyopathy with reduced ejection fraction- - NYHA class II - euvolemic today - weighing daily; reminded to call for an overnight weight gain of > 2 pounds or a weekly weight gain of > 5 pounds - weight stable from last visit here 3 months ago - Echo 07/02/07:  EF 60% - Echo 03/09/16: EF 55-60% with Grade II DD - Echo  04/01/20: EF 60-65% with mild LVH, Grade I DD, normal PA pressure, mild LAE/RAE - Echo 11/06/22: EF 40-45% along with mild/moderate MR - NAS but does like to eat out at [employer] - continue carvedilol 6.25mg  BID - continue jardiance 10mg  daily - continue furosemide 40mg  daily - continue entresto 24/26mg  BID; consider titrating in the future although BP does fluctuate quite a bit - continue spironolactone 25mg  daily - participating in paramedicine program - BNP 11/05/22 was 1240.5  2: HTN- - BP 130/63 - saw PCP Ermalene Searing) 10/24 - BMP 05/18/23 showed sodium 140, potassium 4.3, creatinine 1.15 & GFR 63.13  3: Diabetes- - A1c 05/18/23 was 7.9% - reviewed eating small amounts more frequently so he doesn't get the highs and lows  4: CAD- - saw cardiology Mariah Milling) 10/24 - continue zetia 10mg  daily - LHC 04/06/16:  Mid RCA to Dist RCA lesion, 100 %stenosed. Mid LAD-2 lesion, 60 %stenosed. Mid LAD-1 lesion, 20 %stenosed. The left ventricular systolic function is normal. The left ventricular ejection fraction is 50-55% by visual estimate. LV end diastolic pressure is normal. 2nd [name] lesion, 75 %stenosed.  Chronic total occlusion of the right coronary in the proximal segment well collateralized from the left circumflex and LAD.  5: Atrial fibrillation- - cardioverted 03/03/23 - continue apixaban 5mg  BID  6: PAD- - saw vascular Manson Passey) 08/24 - goes to the wound center - had right lower leg aortogram and angioplasty of right posterior tibial artery and right popliteal artery on 03/27/23 - continue clopidogrel 75mg  daily  Return in 3 months sooner if needed. Will plan on extending this every 6 months at that time so it lands in between cardiology's visit.

## 2023-06-20 NOTE — Telephone Encounter (Signed)
Completed note from June 08, 2023 to include mobility assessment for scooter.

## 2023-06-20 NOTE — Telephone Encounter (Signed)
Office note from 06/08/2023 faxed to [facility] at [phone].

## 2023-06-20 NOTE — Assessment & Plan Note (Addendum)
Decreased mobility due to chronic low back pain, chronic leg weakness, morbid obesity, peripheral neuropathy, PAD and balance issues resulting in need for scooter/power mobility device to use  in house.   PMD, such as a scooter, is necessary to perform ADLs at home including getting to bathroom to toilet, getting to kitchen to eat and cooking, getting to bedroom to dress etc.  He is out of balance using a cane.  Legs given out on him after 8 feet of walking... new diagnosis of PAD and  recent revascularization in right leg... but no improvement in leg pain/weakness. Can  still only stand 30 seconds with leaning on anything. He has fallen multiple times given leg weakness and balance issues.  He cannot use a cane/walker given his poor balance and a leg weakness.  His finger numbness  and cramping that makes it difficult propel a manual wheelchair. Also chronic low back pain limits his ablity to maneuver a manual wheel chair.    He has poor balance and frequent falls given balnce , pain and weakness issues  He has a ramp at home given he does not have strength to step up  a steps. He has back pain and decreased back stability that would make the scooter the best device for his use.  He can operate a mobility device safely, both mentally and physically. A scooter would be able to fit in his house  He is willing and motivated to use the device.Marland Kitchen He would uses it daily during all waking hours.

## 2023-06-20 NOTE — Assessment & Plan Note (Addendum)
Chronic, given poor pain control he is requesting escalation of narcotic dose and changed to oxycodone.  We discussed the significant risk of doing this. He was unable to tolerate gabapentin 100 mg at bedtime for pain. We will try increasing hydrocodone to 3 times a day but he may need a referral back to a pain center.  No recent MRI.  Last Xray 2022 reviewed.. Degenerative disease with disc height loss throughout the lumbar spine most severe at L3-4 and L4-5. Bilateral facet arthropathy of the lumbar spine.

## 2023-06-22 ENCOUNTER — Other Ambulatory Visit: Payer: Self-pay | Admitting: Family Medicine

## 2023-06-22 ENCOUNTER — Encounter: Payer: PPO | Admitting: Physician Assistant

## 2023-06-22 DIAGNOSIS — L97512 Non-pressure chronic ulcer of other part of right foot with fat layer exposed: Secondary | ICD-10-CM | POA: Diagnosis not present

## 2023-06-22 DIAGNOSIS — L97412 Non-pressure chronic ulcer of right heel and midfoot with fat layer exposed: Secondary | ICD-10-CM | POA: Diagnosis not present

## 2023-06-22 DIAGNOSIS — E11621 Type 2 diabetes mellitus with foot ulcer: Secondary | ICD-10-CM | POA: Diagnosis not present

## 2023-06-22 NOTE — Telephone Encounter (Signed)
Prescriptions for the next 3 months sent in and recently increase dosing of 1 tablet 3 times daily. Next prescriptions will only be given at a pain management office visit in 3 months.

## 2023-06-22 NOTE — Progress Notes (Addendum)
3x3 Gauze; 15 pr Gloves Cleanser: Soap and Water Every Other Day/30 Days Discharge Instructions: Gently cleanse wound with antibacterial soap, rinse and pat dry prior to dressing wounds Prim Dressing: Promogran Matrix 4.34 (in) Every Other Day/30 Days ary Discharge Instructions: Moisten w/normal saline or sterile water;  Cover wound as directed. Do not remove from wound bed. Secondary Dressing: (BORDER) Zetuvit Plus SILICONE BORDER Dressing 4x4 (in/in) (DME) (Generic) Every Other Day/30 Days Discharge Instructions: Please do not put silicone bordered dressings under wraps. Use non-bordered dressing only. 1. I would recommend based on what we are seeing that we have the patient continue currently with the wound care measures as before and he is in agreement with the plan this includes the use of the Promogran followed by the bordered foam dressing. 2. I would recommend as well that he continue to monitor for any signs of infection or worsening if anything changes he knows to contact the office let me know. We will see patient back for reevaluation in 1 week here in the clinic. If anything worsens or changes patient will contact our office for additional recommendations. Electronic Signature(s) Signed: 06/22/2023 5:55:00 PM By: Allen Derry PA-C Entered By: Allen Derry on 06/22/2023 14:55:00 -------------------------------------------------------------------------------- SuperBill Details Patient Name: Date of Service: [name] 06/22/2023 Medical Record Number: [id number] Patient Account Number: [id number] Date of Birth/Sex: Treating RN: 15-Jun-1951 (72 y.o. [name]) Yevonne Pax Primary Care Provider: Kerby Nora Other Clinician: Referring Provider: Treating Provider/Extender: [name], [name] in Treatment: [address] ([id number]) [id number] Page 8 of 8 Diagnosis Coding ICD-10 Codes Code Description E11.621 Type 2 diabetes mellitus with foot ulcer L97.512 Non-pressure chronic ulcer of other part of right foot with fat layer exposed I25.10 Atherosclerotic heart disease of native coronary artery without angina pectoris I48.0 Paroxysmal atrial fibrillation Z79.01 Long term (current) use of anticoagulants I10 Essential (primary) hypertension Facility  Procedures : CPT4 Code: [id number] Description: 99214 - WOUND CARE VISIT-LEV 4 EST PT Modifier: Quantity: 1 Physician Procedures : CPT4 Code Description Modifier [id number] 99213 - WC PHYS LEVEL 3 - EST PT ICD-10 Diagnosis Description E11.621 Type 2 diabetes mellitus with foot ulcer L97.512 Non-pressure chronic ulcer of other part of right foot with fat layer exposed I25.10  Atherosclerotic heart disease of native coronary artery without angina pectoris I48.0 Paroxysmal atrial fibrillation Quantity: 1 Electronic Signature(s) Signed: 06/22/2023 5:58:35 PM By: Allen Derry PA-C Entered By: Allen Derry on 06/22/2023 14:58:34  to using the Band-Aids. 06-13-2023 upon evaluation today patient appears to be doing excellent in regard to all 3 wound locations. He has been tolerating the dressing changes without complication and in general I believe that he is making excellent headway towards closure which is great news. No fevers, chills, nausea, vomiting, or diarrhea. 06-22-2023 upon evaluation today patient actually appears to be making good headway towards closure I am actually very pleased with where we stand today. I do not see any signs of active infection locally or  systemically which is great news and in general I do think that we are making good headway towards complete closure which is great news. Electronic Signature(s) Signed: 06/22/2023 5:54:17 PM By: Allen Derry PA-C Entered By: Allen Derry on 06/22/2023 14:54:17 -------------------------------------------------------------------------------- Physical Exam Details Patient Name: Date of Service: [name] 06/22/2023 3:15 PM Medical Record Number: [id number] Patient Account Number: [id number] Date of Birth/Sex: Treating RN: 1951-01-09 (72 y.o. [name]) Yevonne Pax Primary Care Provider: Kerby Nora Other Clinician: Referring Provider: Treating Provider/Extender: [name], [name] in Treatment: 20 Constitutional Well-nourished and well-hydrated in no acute distress. Respiratory normal breathing without difficulty. BRAYEN, HIERHOLZER ([id number]) [id number] Page 3 of 8 Psychiatric this patient is able to make decisions and demonstrates good insight into disease process. Alert and Oriented x 3. pleasant and cooperative. Notes Upon inspection patient's wound bed actually showed signs of good granulation epithelization of all of his wounds I feel like that he is actually making good progress at all measuring a little bit smaller this is good news. Electronic Signature(s) Signed: 06/22/2023 5:54:29 PM By: Allen Derry PA-C Entered By: Allen Derry on 06/22/2023 14:54:28 -------------------------------------------------------------------------------- Physician Orders Details Patient Name: Date of Service: William Albright [facility] H. 06/22/2023 3:15 PM Medical Record Number: [id number] Patient Account Number: [id number] Date of Birth/Sex: Treating RN: November 24, 1950 (72 y.o. [name]) Yevonne Pax Primary Care Provider: Kerby Nora Other Clinician: Referring Provider: Treating Provider/Extender: [name], [name] in Treatment: 20 The following information was  scribed by: Yevonne Pax The information was scribed for: Allen Derry Verbal / Phone Orders: No Diagnosis Coding ICD-10 Coding Code Description E11.621 Type 2 diabetes mellitus with foot ulcer L97.512 Non-pressure chronic ulcer of other part of right foot with fat layer exposed I25.10 Atherosclerotic heart disease of native coronary artery without angina pectoris I48.0 Paroxysmal atrial fibrillation Z79.01 Long term (current) use of anticoagulants I10 Essential (primary) hypertension Follow-up Appointments Return Appointment in 1 week. Bathing/ [employer] wounds with antibacterial soap and water. Anesthetic (Use 'Patient Medications' Section for Anesthetic Order Entry) Lidocaine applied to wound bed Wound Treatment Wound #1R - Calcaneus Wound Laterality: Right Cleanser: Byram Ancillary Kit - 15 Day Supply (Generic) Every Other Day/30 Days Discharge Instructions: Use supplies as instructed; Kit contains: (15) Saline Bullets; (15) 3x3 Gauze; 15 pr Gloves Cleanser: Soap and Water Every Other Day/30 Days Discharge Instructions: Gently cleanse wound with antibacterial soap, rinse and pat dry prior to dressing wounds Prim Dressing: Promogran Matrix 4.34 (in) Every Other Day/30 Days ary Discharge Instructions: Moisten w/normal saline or sterile water; Cover wound as directed. Do not remove from wound bed. Secondary Dressing: (BORDER) Zetuvit Plus SILICONE BORDER Dressing 4x4 (in/in) (DME) (Generic) Every Other Day/30 Days Discharge Instructions: Please do not put silicone bordered dressings under wraps. Use non-bordered dressing only. Wound #2R - Foot Wound Laterality: Dorsal, Right, Distal [name], [name] ([id number]) [id number] Page 4 of 8 Cleanser: Byram Ancillary Kit - 15 Day Supply (Generic) Every Other  Day/30 Days Discharge Instructions: Use supplies as instructed; Kit contains: (15) Saline Bullets; (15) 3x3 Gauze; 15 pr Gloves Cleanser: Soap and  Water Every Other Day/30 Days Discharge Instructions: Gently cleanse wound with antibacterial soap, rinse and pat dry prior to dressing wounds Prim Dressing: Promogran Matrix 4.34 (in) Every Other Day/30 Days ary Discharge Instructions: Moisten w/normal saline or sterile water; Cover wound as directed. Do not remove from wound bed. Secondary Dressing: (BORDER) Zetuvit Plus SILICONE BORDER Dressing 4x4 (in/in) (DME) (Generic) Every Other Day/30 Days Discharge Instructions: Please do not put silicone bordered dressings under wraps. Use non-bordered dressing only. Wound #3 - Foot Wound Laterality: Dorsal, Right, Proximal Cleanser: Byram Ancillary Kit - 15 Day Supply (Generic) Every Other Day/30 Days Discharge Instructions: Use supplies as instructed; Kit contains: (15) Saline Bullets; (15) 3x3 Gauze; 15 pr Gloves Cleanser: Soap and Water Every Other Day/30 Days Discharge Instructions: Gently cleanse wound with antibacterial soap, rinse and pat dry prior to dressing wounds Prim Dressing: Promogran Matrix 4.34 (in) Every Other Day/30 Days ary Discharge Instructions: Moisten w/normal saline or sterile water; Cover wound as directed. Do not remove from wound bed. Secondary Dressing: (BORDER) Zetuvit Plus SILICONE BORDER Dressing 4x4 (in/in) (DME) (Generic) Every Other Day/30 Days Discharge Instructions: Please do not put silicone bordered dressings under wraps. Use non-bordered dressing only. Electronic Signature(s) Signed: 06/22/2023 6:30:03 PM By: Allen Derry PA-C Signed: 06/26/2023 8:00:25 AM By: Yevonne Pax RN Entered By: Yevonne Pax on 06/22/2023 13:04:30 -------------------------------------------------------------------------------- Problem List Details Patient Name: Date of Service: William Albright [facility] H. 06/22/2023 3:15 PM Medical Record Number: [id number] Patient Account Number: [id number] Date of Birth/Sex: Treating RN: [birth year] (72 y.o. [name] Primary Care Provider: Kerby Nora Other Clinician: Referring Provider: Treating Provider/Extender: [name], [name] in Treatment: 20 Active Problems ICD-10 Encounter Code Description Active Date MDM Diagnosis E11.621 Type 2 diabetes mellitus with foot ulcer 01/31/2023 No Yes L97.512 Non-pressure chronic ulcer of other part of right foot with fat layer exposed 01/31/2023 No Yes I25.10 Atherosclerotic heart disease of native coronary artery without angina pectoris 01/31/2023 No Yes I48.0 Paroxysmal atrial fibrillation 01/31/2023 No Yes CASMIER, TARDIF ([id number]) [id number] Page 5 of 8 Z79.01 Long term (current) use of anticoagulants 01/31/2023 No Yes I10 Essential (primary) hypertension 01/31/2023 No Yes Inactive Problems Resolved Problems Electronic Signature(s) Signed: 06/22/2023 3:32:16 PM By: Allen Derry PA-C Entered By: Allen Derry on 06/22/2023 12:32:16 -------------------------------------------------------------------------------- Progress Note Details Patient Name: Date of Service: William Albright HN H. 06/22/2023 3:15 PM Medical Record Number: [id number] Patient Account Number: [id number] Date of Birth/Sex: Treating RN: September 01, 1950 (72 y.o. [name]) Yevonne Pax Primary Care Provider: Kerby Nora Other Clinician: Referring Provider: Treating Provider/Extender: [name], [name] in Treatment: 20 Subjective Chief Complaint Information obtained from Patient Right foot ulcers History of Present Illness (HPI) 01-31-2023 upon evaluation today patient appears to be doing poorly currently in regard to wounds on his right heel, right dorsal foot, and right lateral foot. Subsequently he does have known peripheral vascular disease and again this is something that he needs to I think Checked formally. This is what the majority of the conversation today hinged around and the patient voiced understanding as far as that is concerned. Fortunately I do not see any signs of  active infection locally nor systemically which is great news. Patient does have a history of diabetes mellitus type 2, atrial fibrillation for which she is on long-term anticoagulant Therapy, hypertension, and coronary artery disease. Patient's hemoglobin A1c most  EIVEN, SANGER ([id number]) [id number] Page 1 of 8 Visit Report for 06/22/2023 Chief Complaint Document Details Patient Name: Date of Service: William Albright [facility]. 06/22/2023 3:15 PM Medical Record Number: [id number] Patient Account Number: [id number] Date of Birth/Sex: Treating RN: 02/14/1951 (72 y.o. [name]) Yevonne Pax Primary Care Provider: Kerby Nora Other Clinician: Referring Provider: Treating Provider/Extender: [name], [name] in Treatment: 20 Information Obtained from: Patient Chief Complaint Right foot ulcers Electronic Signature(s) Signed: 06/22/2023 3:32:20 PM By: Allen Derry PA-C Entered By: Allen Derry on 06/22/2023 12:32:19 -------------------------------------------------------------------------------- HPI Details Patient Name: Date of Service: William Albright HN H. 06/22/2023 3:15 PM Medical Record Number: [id number] Patient Account Number: [id number] Date of Birth/Sex: Treating RN: 1950/12/22 (72 y.o. [name]) Yevonne Pax Primary Care Provider: Kerby Nora Other Clinician: Referring Provider: Treating Provider/Extender: [name], [name] in Treatment: 20 History of Present Illness HPI Description: 01-31-2023 upon evaluation today patient appears to be doing poorly currently in regard to wounds on his right heel, right dorsal foot, and right lateral foot. Subsequently he does have known peripheral vascular disease and again this is something that he needs to I think Checked formally. This is what the majority of the conversation today hinged around and the patient voiced understanding as far as that is concerned. Fortunately I do not see any signs of active infection locally nor systemically which is great news. Patient does have a history of diabetes mellitus type 2, atrial fibrillation for which she is on long-term anticoagulant Therapy, hypertension, and coronary artery disease. Patient's hemoglobin A1c most recently was on  11-17-2022 and was 7.2 and currently he is on Eliquis and Plavix. 02-07-2023 upon evaluation today patient appears to be doing well currently in regard to his wounds all things considered I feel like we are still maintaining that does not seem like it is any worse is also not significantly better. I discussed with the patient that I do believe he would benefit from the arterial evaluation we still need to get this done as quickly as possible and subsequently we did put in a follow-up call with the vascular office today with regard to this. 02-14-2023 upon evaluation today patient appears to be doing a little better in regard to his wounds which are showing signs of loosening which is great news. With that being said he is experiencing an improvement overall in his symptoms and we are still waiting on the vascular evaluation want to get this result and consider whether or not his blood flow is good or not we will be able to make a better determination of next steps. For now we will try and avoid any aggressive sharp debridement to know that he has good arterial flow. [name], [name] ([id number]) [id number] Page 2 of 8 02-21-2023 upon evaluation today patient appears to be doing about the same in regard to his wound. Fortunately there does not appear to be any signs of active infection at this time which is great news. No fevers, chills, nausea, vomiting, or diarrhea. I did review patient's arterial study and it appears that he has pretty good flow in the left is not perfect but it is decent. On the right however he is definitely not doing nearly as good and I think that he is going to need to see one of the vascular doctors for further evaluation and treatment of this right leg in order to get these wounds to heal. 02-28-2023 upon evaluation today patient appears to be doing well currently  Day/30 Days Discharge Instructions: Use supplies as instructed; Kit contains: (15) Saline Bullets; (15) 3x3 Gauze; 15 pr Gloves Cleanser: Soap and  Water Every Other Day/30 Days Discharge Instructions: Gently cleanse wound with antibacterial soap, rinse and pat dry prior to dressing wounds Prim Dressing: Promogran Matrix 4.34 (in) Every Other Day/30 Days ary Discharge Instructions: Moisten w/normal saline or sterile water; Cover wound as directed. Do not remove from wound bed. Secondary Dressing: (BORDER) Zetuvit Plus SILICONE BORDER Dressing 4x4 (in/in) (DME) (Generic) Every Other Day/30 Days Discharge Instructions: Please do not put silicone bordered dressings under wraps. Use non-bordered dressing only. Wound #3 - Foot Wound Laterality: Dorsal, Right, Proximal Cleanser: Byram Ancillary Kit - 15 Day Supply (Generic) Every Other Day/30 Days Discharge Instructions: Use supplies as instructed; Kit contains: (15) Saline Bullets; (15) 3x3 Gauze; 15 pr Gloves Cleanser: Soap and Water Every Other Day/30 Days Discharge Instructions: Gently cleanse wound with antibacterial soap, rinse and pat dry prior to dressing wounds Prim Dressing: Promogran Matrix 4.34 (in) Every Other Day/30 Days ary Discharge Instructions: Moisten w/normal saline or sterile water; Cover wound as directed. Do not remove from wound bed. Secondary Dressing: (BORDER) Zetuvit Plus SILICONE BORDER Dressing 4x4 (in/in) (DME) (Generic) Every Other Day/30 Days Discharge Instructions: Please do not put silicone bordered dressings under wraps. Use non-bordered dressing only. Electronic Signature(s) Signed: 06/22/2023 6:30:03 PM By: Allen Derry PA-C Signed: 06/26/2023 8:00:25 AM By: Yevonne Pax RN Entered By: Yevonne Pax on 06/22/2023 13:04:30 -------------------------------------------------------------------------------- Problem List Details Patient Name: Date of Service: William Albright HN H. 06/22/2023 3:15 PM Medical Record Number: [id number] Patient Account Number: [id number] Date of Birth/Sex: Treating RN: [birth year] (72 y.o. [name] Primary Care Provider: Kerby Nora Other Clinician: Referring Provider: Treating Provider/Extender: [name], [name] in Treatment: 20 Active Problems ICD-10 Encounter Code Description Active Date MDM Diagnosis E11.621 Type 2 diabetes mellitus with foot ulcer 01/31/2023 No Yes L97.512 Non-pressure chronic ulcer of other part of right foot with fat layer exposed 01/31/2023 No Yes I25.10 Atherosclerotic heart disease of native coronary artery without angina pectoris 01/31/2023 No Yes I48.0 Paroxysmal atrial fibrillation 01/31/2023 No Yes CASMIER, TARDIF ([id number]) [id number] Page 5 of 8 Z79.01 Long term (current) use of anticoagulants 01/31/2023 No Yes I10 Essential (primary) hypertension 01/31/2023 No Yes Inactive Problems Resolved Problems Electronic Signature(s) Signed: 06/22/2023 3:32:16 PM By: Allen Derry PA-C Entered By: Allen Derry on 06/22/2023 12:32:16 -------------------------------------------------------------------------------- Progress Note Details Patient Name: Date of Service: William Albright HN H. 06/22/2023 3:15 PM Medical Record Number: [id number] Patient Account Number: [id number] Date of Birth/Sex: Treating RN: September 01, 1950 (72 y.o. [name]) Yevonne Pax Primary Care Provider: Kerby Nora Other Clinician: Referring Provider: Treating Provider/Extender: [name], [name] in Treatment: 20 Subjective Chief Complaint Information obtained from Patient Right foot ulcers History of Present Illness (HPI) 01-31-2023 upon evaluation today patient appears to be doing poorly currently in regard to wounds on his right heel, right dorsal foot, and right lateral foot. Subsequently he does have known peripheral vascular disease and again this is something that he needs to I think Checked formally. This is what the majority of the conversation today hinged around and the patient voiced understanding as far as that is concerned. Fortunately I do not see any signs of  active infection locally nor systemically which is great news. Patient does have a history of diabetes mellitus type 2, atrial fibrillation for which she is on long-term anticoagulant Therapy, hypertension, and coronary artery disease. Patient's hemoglobin A1c most  Day/30 Days Discharge Instructions: Use supplies as instructed; Kit contains: (15) Saline Bullets; (15) 3x3 Gauze; 15 pr Gloves Cleanser: Soap and  Water Every Other Day/30 Days Discharge Instructions: Gently cleanse wound with antibacterial soap, rinse and pat dry prior to dressing wounds Prim Dressing: Promogran Matrix 4.34 (in) Every Other Day/30 Days ary Discharge Instructions: Moisten w/normal saline or sterile water; Cover wound as directed. Do not remove from wound bed. Secondary Dressing: (BORDER) Zetuvit Plus SILICONE BORDER Dressing 4x4 (in/in) (DME) (Generic) Every Other Day/30 Days Discharge Instructions: Please do not put silicone bordered dressings under wraps. Use non-bordered dressing only. Wound #3 - Foot Wound Laterality: Dorsal, Right, Proximal Cleanser: Byram Ancillary Kit - 15 Day Supply (Generic) Every Other Day/30 Days Discharge Instructions: Use supplies as instructed; Kit contains: (15) Saline Bullets; (15) 3x3 Gauze; 15 pr Gloves Cleanser: Soap and Water Every Other Day/30 Days Discharge Instructions: Gently cleanse wound with antibacterial soap, rinse and pat dry prior to dressing wounds Prim Dressing: Promogran Matrix 4.34 (in) Every Other Day/30 Days ary Discharge Instructions: Moisten w/normal saline or sterile water; Cover wound as directed. Do not remove from wound bed. Secondary Dressing: (BORDER) Zetuvit Plus SILICONE BORDER Dressing 4x4 (in/in) (DME) (Generic) Every Other Day/30 Days Discharge Instructions: Please do not put silicone bordered dressings under wraps. Use non-bordered dressing only. Electronic Signature(s) Signed: 06/22/2023 6:30:03 PM By: Allen Derry PA-C Signed: 06/26/2023 8:00:25 AM By: Yevonne Pax RN Entered By: Yevonne Pax on 06/22/2023 13:04:30 -------------------------------------------------------------------------------- Problem List Details Patient Name: Date of Service: William Albright HN H. 06/22/2023 3:15 PM Medical Record Number: [id number] Patient Account Number: [id number] Date of Birth/Sex: Treating RN: [birth year] (72 y.o. [name] Primary Care Provider: Kerby Nora Other Clinician: Referring Provider: Treating Provider/Extender: [name], [name] in Treatment: 20 Active Problems ICD-10 Encounter Code Description Active Date MDM Diagnosis E11.621 Type 2 diabetes mellitus with foot ulcer 01/31/2023 No Yes L97.512 Non-pressure chronic ulcer of other part of right foot with fat layer exposed 01/31/2023 No Yes I25.10 Atherosclerotic heart disease of native coronary artery without angina pectoris 01/31/2023 No Yes I48.0 Paroxysmal atrial fibrillation 01/31/2023 No Yes CASMIER, TARDIF ([id number]) [id number] Page 5 of 8 Z79.01 Long term (current) use of anticoagulants 01/31/2023 No Yes I10 Essential (primary) hypertension 01/31/2023 No Yes Inactive Problems Resolved Problems Electronic Signature(s) Signed: 06/22/2023 3:32:16 PM By: Allen Derry PA-C Entered By: Allen Derry on 06/22/2023 12:32:16 -------------------------------------------------------------------------------- Progress Note Details Patient Name: Date of Service: William Albright HN H. 06/22/2023 3:15 PM Medical Record Number: [id number] Patient Account Number: [id number] Date of Birth/Sex: Treating RN: September 01, 1950 (72 y.o. [name]) Yevonne Pax Primary Care Provider: Kerby Nora Other Clinician: Referring Provider: Treating Provider/Extender: [name], [name] in Treatment: 20 Subjective Chief Complaint Information obtained from Patient Right foot ulcers History of Present Illness (HPI) 01-31-2023 upon evaluation today patient appears to be doing poorly currently in regard to wounds on his right heel, right dorsal foot, and right lateral foot. Subsequently he does have known peripheral vascular disease and again this is something that he needs to I think Checked formally. This is what the majority of the conversation today hinged around and the patient voiced understanding as far as that is concerned. Fortunately I do not see any signs of  active infection locally nor systemically which is great news. Patient does have a history of diabetes mellitus type 2, atrial fibrillation for which she is on long-term anticoagulant Therapy, hypertension, and coronary artery disease. Patient's hemoglobin A1c most  EIVEN, SANGER ([id number]) [id number] Page 1 of 8 Visit Report for 06/22/2023 Chief Complaint Document Details Patient Name: Date of Service: William Albright [facility] H. 06/22/2023 3:15 PM Medical Record Number: [id number] Patient Account Number: [id number] Date of Birth/Sex: Treating RN: 02/14/1951 (72 y.o. [name]) Yevonne Pax Primary Care Provider: Kerby Nora Other Clinician: Referring Provider: Treating Provider/Extender: [name], [name] in Treatment: 20 Information Obtained from: Patient Chief Complaint Right foot ulcers Electronic Signature(s) Signed: 06/22/2023 3:32:20 PM By: Allen Derry PA-C Entered By: Allen Derry on 06/22/2023 12:32:19 -------------------------------------------------------------------------------- HPI Details Patient Name: Date of Service: William Albright HN H. 06/22/2023 3:15 PM Medical Record Number: [id number] Patient Account Number: [id number] Date of Birth/Sex: Treating RN: 1950/12/22 (72 y.o. [name]) Yevonne Pax Primary Care Provider: Kerby Nora Other Clinician: Referring Provider: Treating Provider/Extender: [name], [name] in Treatment: 20 History of Present Illness HPI Description: 01-31-2023 upon evaluation today patient appears to be doing poorly currently in regard to wounds on his right heel, right dorsal foot, and right lateral foot. Subsequently he does have known peripheral vascular disease and again this is something that he needs to I think Checked formally. This is what the majority of the conversation today hinged around and the patient voiced understanding as far as that is concerned. Fortunately I do not see any signs of active infection locally nor systemically which is great news. Patient does have a history of diabetes mellitus type 2, atrial fibrillation for which she is on long-term anticoagulant Therapy, hypertension, and coronary artery disease. Patient's hemoglobin A1c most recently was on  11-17-2022 and was 7.2 and currently he is on Eliquis and Plavix. 02-07-2023 upon evaluation today patient appears to be doing well currently in regard to his wounds all things considered I feel like we are still maintaining that does not seem like it is any worse is also not significantly better. I discussed with the patient that I do believe he would benefit from the arterial evaluation we still need to get this done as quickly as possible and subsequently we did put in a follow-up call with the vascular office today with regard to this. 02-14-2023 upon evaluation today patient appears to be doing a little better in regard to his wounds which are showing signs of loosening which is great news. With that being said he is experiencing an improvement overall in his symptoms and we are still waiting on the vascular evaluation want to get this result and consider whether or not his blood flow is good or not we will be able to make a better determination of next steps. For now we will try and avoid any aggressive sharp debridement to know that he has good arterial flow. [name], [name] ([id number]) [id number] Page 2 of 8 02-21-2023 upon evaluation today patient appears to be doing about the same in regard to his wound. Fortunately there does not appear to be any signs of active infection at this time which is great news. No fevers, chills, nausea, vomiting, or diarrhea. I did review patient's arterial study and it appears that he has pretty good flow in the left is not perfect but it is decent. On the right however he is definitely not doing nearly as good and I think that he is going to need to see one of the vascular doctors for further evaluation and treatment of this right leg in order to get these wounds to heal. 02-28-2023 upon evaluation today patient appears to be doing well currently  3x3 Gauze; 15 pr Gloves Cleanser: Soap and Water Every Other Day/30 Days Discharge Instructions: Gently cleanse wound with antibacterial soap, rinse and pat dry prior to dressing wounds Prim Dressing: Promogran Matrix 4.34 (in) Every Other Day/30 Days ary Discharge Instructions: Moisten w/normal saline or sterile water;  Cover wound as directed. Do not remove from wound bed. Secondary Dressing: (BORDER) Zetuvit Plus SILICONE BORDER Dressing 4x4 (in/in) (DME) (Generic) Every Other Day/30 Days Discharge Instructions: Please do not put silicone bordered dressings under wraps. Use non-bordered dressing only. 1. I would recommend based on what we are seeing that we have the patient continue currently with the wound care measures as before and he is in agreement with the plan this includes the use of the Promogran followed by the bordered foam dressing. 2. I would recommend as well that he continue to monitor for any signs of infection or worsening if anything changes he knows to contact the office let me know. We will see patient back for reevaluation in 1 week here in the clinic. If anything worsens or changes patient will contact our office for additional recommendations. Electronic Signature(s) Signed: 06/22/2023 5:55:00 PM By: Allen Derry PA-C Entered By: Allen Derry on 06/22/2023 14:55:00 -------------------------------------------------------------------------------- SuperBill Details Patient Name: Date of Service: [name] 06/22/2023 Medical Record Number: [id number] Patient Account Number: [id number] Date of Birth/Sex: Treating RN: 15-Jun-1951 (72 y.o. [name]) Yevonne Pax Primary Care Provider: Kerby Nora Other Clinician: Referring Provider: Treating Provider/Extender: [name], [name] in Treatment: [address] ([id number]) [id number] Page 8 of 8 Diagnosis Coding ICD-10 Codes Code Description E11.621 Type 2 diabetes mellitus with foot ulcer L97.512 Non-pressure chronic ulcer of other part of right foot with fat layer exposed I25.10 Atherosclerotic heart disease of native coronary artery without angina pectoris I48.0 Paroxysmal atrial fibrillation Z79.01 Long term (current) use of anticoagulants I10 Essential (primary) hypertension Facility  Procedures : CPT4 Code: [id number] Description: 99214 - WOUND CARE VISIT-LEV 4 EST PT Modifier: Quantity: 1 Physician Procedures : CPT4 Code Description Modifier [id number] 99213 - WC PHYS LEVEL 3 - EST PT ICD-10 Diagnosis Description E11.621 Type 2 diabetes mellitus with foot ulcer L97.512 Non-pressure chronic ulcer of other part of right foot with fat layer exposed I25.10  Atherosclerotic heart disease of native coronary artery without angina pectoris I48.0 Paroxysmal atrial fibrillation Quantity: 1 Electronic Signature(s) Signed: 06/22/2023 5:58:35 PM By: Allen Derry PA-C Entered By: Allen Derry on 06/22/2023 14:58:34

## 2023-06-22 NOTE — Telephone Encounter (Signed)
Last office visit 06/08/2023 for back pain and alteration in mobility.  Last refilled 06/08/2023 for #90 with no refills.  Next Appt: No future appointments with PCP.

## 2023-06-26 NOTE — Progress Notes (Signed)
of 11 [name] in Treatment: 20 Education Assessment Education Provided To: Patient Education Topics Provided Wound/Skin Impairment: Handouts: Caring for Your Ulcer Methods: Explain/Verbal Responses: State content correctly Electronic Signature(s) Signed: 06/26/2023 8:00:25 AM By: [name] Entered By: [name] on 06/22/2023 12:33:45 -------------------------------------------------------------------------------- Wound Assessment Details Patient Name: Date of Service: [name] HN H. 06/22/2023 3:15 PM Medical Record Number: [id number] Patient  Account Number: [id number] Date of Birth/Sex: Treating [name]: 08/03/1951 (72 y.o. [name]) [name] Primary Care [name]: [name] Other Clinician: Referring [name]: Treating [name]/Extender: [name], [name] in Treatment: 20 Wound Status Wound Number: 1R Primary Diabetic Wound/Ulcer of the Lower Extremity Etiology: Wound Location: Right Calcaneus Wound Open Wounding Event: Gradually Appeared Status: Date Acquired: 12/10/2022 Comorbid Congestive Heart Failure, Coronary Artery Disease, [name] Of Treatment: 20 History: Hypertension, Type II Diabetes, Neuropathy Clustered Wound: Yes Photos Wound Measurements Length: (cm) 0.7 Width: (cm) 0.4 Depth: (cm) 0.1 Area: (cm) 0.22 Volume: (cm) 0.022 % Reduction in Area: 94.9% % Reduction in Volume: 94.8% Epithelialization: Large (67-100%) Tunneling: No Undermining: No Wound Description Classification: Grade 1 [name] ([id number]) Exudate Amount: None Present Foul Odor After Cleansing: No [id number] Page 8 of 11 Slough/Fibrino No Wound Bed Granulation Amount: Medium (34-66%) Exposed Structure Granulation Quality: Red Fascia Exposed: No Necrotic Amount: None Present (0%) Fat Layer (Subcutaneous Tissue) Exposed: Yes Tendon Exposed: No Muscle Exposed: No Joint Exposed: No Bone Exposed: No Treatment Notes Wound #1R (Calcaneus) Wound Laterality: Right Cleanser Byram Ancillary Kit - 15 Day Supply Discharge Instruction: Use supplies as instructed; Kit contains: (15) Saline Bullets; (15) 3x3 Gauze; 15 pr Gloves Soap and Water Discharge Instruction: Gently cleanse wound with antibacterial soap, rinse and pat dry prior to dressing wounds Peri-Wound Care Topical Primary Dressing Prisma 4.34 (in) Discharge Instruction: Moisten w/normal saline or sterile water; Cover wound as directed. Do not remove from wound bed. Secondary Dressing Zetuvit Plus 4x4 (in/in) Secured With Compression  Wrap Compression Stockings Add-Ons Electronic Signature(s) Signed: 06/26/2023 8:00:25 AM By: [name] Entered By: [name] on 06/22/2023 12:31:55 -------------------------------------------------------------------------------- Wound Assessment Details Patient Name: Date of Service: [name] HN H. 06/22/2023 3:15 PM Medical Record Number: [id number] Patient Account Number: [id number] Date of Birth/Sex: Treating [name]: [birth year] (72 y.o. [name]) [name] Primary Care [name]: [name] Other Clinician: Referring [name]: Treating [name]/Extender: [name], [name] in Treatment: 20 Wound Status Wound Number: 2R Primary Diabetic Wound/Ulcer of the Lower Extremity Etiology: Wound Location: Right, Distal, Dorsal Foot Wound Open Wounding Event: Gradually Appeared Status: Date Acquired: 12/10/2022 Comorbid Congestive Heart Failure, Coronary Artery Disease, [name] Of Treatment: 20 History: Hypertension, Type II Diabetes, Neuropathy Clustered Wound: No SIM, TRUNDLE ([id number]) [id number] Page 9 of 11 Photos Wound Measurements Length: (cm) 1.2 Width: (cm) 1.7 Depth: (cm) 0.2 Area: (cm) 1.602 Volume: (cm) 0.32 % Reduction in Area: -85.4% % Reduction in Volume: -272.1% Epithelialization: Large (67-100%) Tunneling: No Undermining: No Wound Description Classification: Grade 1 Exudate Amount: Medium Exudate Type: Serosanguineous Exudate Color: red, brown Foul Odor After Cleansing: No Slough/Fibrino No Wound Bed Granulation Amount: Medium (34-66%) Exposed Structure Granulation Quality: Red, Pink Fascia Exposed: No Necrotic Amount: None Present (0%) Fat Layer (Subcutaneous Tissue) Exposed: Yes Tendon Exposed: No Muscle Exposed: No Joint Exposed: No Bone Exposed: No Treatment Notes Wound #2R (Foot) Wound Laterality: Dorsal, Right, Distal Cleanser Byram Ancillary Kit - 15 Day Supply Discharge Instruction: Use supplies  as instructed; Kit contains: (15) Saline Bullets; (15) 3x3 Gauze; 15 pr Gloves Soap and  Diabetes, Neuropathy Diabetes, Neuropathy Diabetes, Neuropathy 12/10/2022 12/10/2022 12/10/2022 Date Acquired: 20 20 20  [name] of Treatment: Open Open Open Wound Status: No No No Wound Recurrence: Yes No No Clustered Wound: 0.7x0.4x0.1 1.2x1.7x0.2 0.5x0.5x0.2 Measurements L x W x D (cm) 0.22 1.602 0.196 A (cm) : rea 0.022 0.32 0.039 Volume (cm) : 94.90% -85.40% 92.20% % Reduction in A rea: 94.80% -272.10% 84.50% % Reduction in Volume: Grade 1 Grade 1 Grade 1 Classification: None Present Medium Medium Exudate A mount: N/A Serosanguineous Serosanguineous Exudate Type: N/A red, brown red, brown Exudate Color: Medium (34-66%) Medium (34-66%) Medium (34-66%) Granulation A mount: Red Red, Pink Red Granulation Quality: None Present (0%) None Present (0%) Medium (34-66%) Necrotic A mount: Fat Layer (Subcutaneous Tissue): Yes Fat Layer (Subcutaneous Tissue): Yes Fat Layer (Subcutaneous Tissue): Yes Exposed Structures: Fascia: No Fascia: No Fascia: No [name] ([id number]) [id number] Page 5 of 11 Tendon: No Tendon: No Tendon: No Muscle: No Muscle: No Muscle: No Joint: No Joint: No Joint: No Bone: No Bone: No Bone: No Large (67-100%) Large (67-100%) Small (1-33%) Epithelialization: Treatment Notes Electronic  Signature(s) Signed: 06/26/2023 8:00:25 AM By: [name] Entered By: [name] on 06/22/2023 12:33:08 -------------------------------------------------------------------------------- Multi-Disciplinary Care Plan Details Patient Name: Date of Service: [name] HN H. 06/22/2023 3:15 PM Medical Record Number: [id number] Patient Account Number: [id number] Date of Birth/Sex: Treating [name]: 04-13-1951 (72 y.o. [name]) [name] Primary Care Aizza Santiago: [name] Other Clinician: Referring Ainsleigh Kakos: Treating Taren Toops/Extender: [name], [name] in Treatment: 20 Active Inactive Wound/Skin Impairment Nursing Diagnoses: Impaired tissue integrity Knowledge deficit related to ulceration/compromised skin integrity Goals: Patient/caregiver will verbalize understanding of skin care regimen Date Initiated: 02/01/2023 Date Inactivated: 02/28/2023 Target Resolution Date: 03/04/2023 Goal Status: Met Ulcer/skin breakdown will have a volume reduction of 30% by week 4 Date Initiated: 02/01/2023 Date Inactivated: 02/28/2023 Target Resolution Date: 03/04/2023 Goal Status: Met Ulcer/skin breakdown will have a volume reduction of 50% by week 8 Date Initiated: 02/01/2023 Date Inactivated: 04/11/2023 Target Resolution Date: 04/04/2023 Goal Status: Met Ulcer/skin breakdown will have a volume reduction of 80% by week 12 Date Initiated: 02/01/2023 Target Resolution Date: 07/05/2023 Goal Status: Active Interventions: Assess patient/caregiver ability to obtain necessary supplies Assess patient/caregiver ability to perform ulcer/skin care regimen upon admission and as needed Assess ulceration(s) every visit Provide education on ulcer and skin care Treatment Activities: Referred to DME Deklyn Trachtenberg for dressing supplies : 01/31/2023 Skin care regimen initiated : 01/31/2023 Notes: Electronic Signature(s) Signed: 06/26/2023 8:00:25 AM By: [name] Entered By: [name] on 06/22/2023 12:33:28 [name] ([id number]) [id number] Page 6 of 11 -------------------------------------------------------------------------------- Pain Assessment Details Patient Name: Date of Service: [name] 06/22/2023 3:15 PM Medical Record Number: [id number] Patient Account Number: [id number] Date of Birth/Sex: Treating [name]: November 16, 1950 (72 y.o. [name]) [name] Primary Care Verenice Westrich: [name] Other Clinician: Referring Tallia Moehring: Treating Kalise Fickett/Extender: [name], [name] in Treatment: 20 Active Problems Location of Pain Severity and Description of Pain Patient Has Paino No Site Locations Pain Management and Medication Current Pain Management: Electronic Signature(s) Signed: 06/26/2023 8:00:25 AM By: [name] Entered By: [name] on 06/22/2023 12:30:56 -------------------------------------------------------------------------------- Patient/Caregiver Education Details Patient Name: Date of Service: [name] 10/24/2024andnbsp3:15 PM Medical Record Number: [id number] Patient Account Number: [id number] Date of Birth/Gender: Treating [name]: 07-Apr-1951 (72 y.o. [name] Primary Care Physician: [name] Other Clinician: Referring Physician: Treating Physician/Extender: Ilyaas, Knack ([id number]) [id number] Page 7  JAYNI, ELLWANGER ([id number]) [id number] Page 1 of 11 Visit Report for 06/22/2023 Arrival Information Details Patient Name: Date of Service: [name] [facility] H. 06/22/2023 3:15 PM Medical Record Number: [id number] Patient Account Number: [id number] Date of Birth/Sex: Treating [name]: [birth year] (72 y.o. [name]) [name] Primary Care Aadvik Roker: [name] Other Clinician: Referring Nasir Bright: Treating Kerri-Anne Haeberle/Extender: [name], [name] in Treatment: 20 Visit Information History Since Last Visit Added or deleted any medications: No Patient Arrived: [name] Any new allergies or adverse reactions: No Arrival Time: 15:21 Had a fall or experienced change in No Accompanied By: self activities of daily living that may affect Transfer Assistance: None risk of falls: Patient Identification Verified: Yes Signs or symptoms of abuse/neglect since last visito No Secondary Verification Process Completed: Yes Hospitalized since last visit: No Patient Requires Transmission-Based Precautions: No Implantable device outside of the clinic excluding No Patient Has Alerts: Yes cellular tissue based products placed in the center Patient Alerts: Patient on Blood Thinner since last visit: Diabetes type 2 Has Dressing in Place as Prescribed: Yes Eliquis/Plavix Pain Present Now: No ABI R 1.23 TBI 0.57 ABI L 0.98 TBI 0.80 Electronic Signature(s) Signed: 06/26/2023 8:00:25 AM By: [name] Entered By: [name] on 06/22/2023 12:24:29 -------------------------------------------------------------------------------- Clinic Level of Care Assessment Details Patient Name: Date of Service: [name] 06/22/2023 3:15 PM Medical Record Number: [id number] Patient Account Number: [id number] Date of Birth/Sex: Treating [name]: 09-07-1950 (72 y.o. [name]) [name] Primary Care Magdalen Cabana: [name] Other Clinician: Referring Ewart Carrera: Treating Armando Bukhari/Extender: [name], [name] in Treatment: 20 Clinic Level of Care Assessment Items TOOL 4 Quantity Score X- 1 0 Use when only an EandM is performed on FOLLOW-UP visit ASSESSMENTS - Nursing Assessment / Reassessment X- 1 10 Reassessment of Co-morbidities (includes updates in patient status) X- 1 5 Reassessment of Adherence to Treatment Plan KEMONTA, SEYMOUR ([id number]) [id number] Page 2 of 11 ASSESSMENTS - Wound and Skin A ssessment / Reassessment []  - Simple Wound Assessment / Reassessment - one wound 0 X- 3 5 Complex Wound Assessment / Reassessment - multiple wounds []  - 0 Dermatologic / Skin Assessment (not related to wound area) ASSESSMENTS - Focused Assessment []  - 0 Circumferential Edema Measurements - multi extremities []  - 0 Nutritional Assessment / Counseling / Intervention []  - 0 Lower Extremity Assessment (monofilament, tuning fork, pulses) []  - 0 Peripheral Arterial Disease Assessment (using hand held doppler) ASSESSMENTS - Ostomy and/or Continence Assessment and Care []  - 0 Incontinence Assessment and Management []  - 0 Ostomy Care Assessment and Management (repouching, etc.) PROCESS - Coordination of Care X - Simple Patient / Family Education for ongoing care 1 15 []  - 0 Complex (extensive) Patient / Family Education for ongoing care []  - 0 Staff obtains Chiropractor, Records, T Results / Process Orders est []  - 0 Staff telephones HHA, Nursing Homes / Clarify orders / etc []  - 0 Routine Transfer to another Facility (non-emergent condition) []  - 0 Routine Hospital Admission (non-emergent condition) []  - 0 New Admissions / Manufacturing engineer / Ordering NPWT Apligraf, etc. , []  - 0 Emergency Hospital Admission (emergent condition) X- 1 10 Simple Discharge Coordination []  - 0 Complex (extensive) Discharge Coordination PROCESS - Special Needs []  - 0 Pediatric / Minor Patient Management []  - 0 Isolation Patient Management []  -  0 Hearing / Language / Visual special needs []  - 0 Assessment of Community assistance (transportation, D/C planning, etc.) []  - 0 Additional assistance / Altered mentation []  -  Water Discharge Instruction: Gently cleanse wound with antibacterial soap, rinse and pat dry prior to dressing wounds Peri-Wound Care Topical Primary Dressing Prisma 4.34 (in) Discharge Instruction: Moisten w/normal saline or sterile water; Cover wound as directed. Do not remove from wound bed. Secondary Dressing Zetuvit Plus 4x4 (in/in) Secured With Compression Wrap Compression Stockings Add-Ons Electronic Signature(s) Signed: 06/26/2023 8:00:25 AM By: [name] Entered By: [name] on 06/22/2023 12:32:23 [name] ([id number]) [id number] Page 10 of 11 -------------------------------------------------------------------------------- Wound Assessment Details Patient Name: Date of Service: [name] 06/22/2023 3:15 PM Medical Record Number: [id number] Patient Account Number: [id number] Date of Birth/Sex: Treating [name]: 20-Nov-1950 (72 y.o. [name]) [name] Primary Care Addis Bennie: [name] Other Clinician: Referring Aaliyha Mumford: Treating Eldrick Penick/Extender: [name], [name] in Treatment: 20 Wound Status Wound Number: 3 Primary Diabetic Wound/Ulcer of the Lower Extremity Etiology: Wound Location: Right, Proximal, Dorsal Foot Wound Open Wounding Event: Gradually Appeared Status: Date Acquired: 12/10/2022 Comorbid Congestive Heart Failure, Coronary Artery Disease, [name] Of Treatment: 20 History: Hypertension, Type II Diabetes, Neuropathy Clustered Wound: No Photos Wound Measurements Length: (cm) 0.5 Width: (cm) 0.5 Depth: (cm) 0.2 Area: (cm) 0.196 Volume: (cm) 0.039 % Reduction in Area: 92.2% % Reduction in Volume: 84.5% Epithelialization: Small (1-33%) Tunneling: No Undermining: No Wound Description Classification: Grade 1 Exudate Amount: Medium Exudate Type: Serosanguineous Exudate Color: red, brown Foul Odor After  Cleansing: No Slough/Fibrino Yes Wound Bed Granulation Amount: Medium (34-66%) Exposed Structure Granulation Quality: Red Fascia Exposed: No Necrotic Amount: Medium (34-66%) Fat Layer (Subcutaneous Tissue) Exposed: Yes Necrotic Quality: Adherent Slough Tendon Exposed: No Muscle Exposed: No Joint Exposed: No Bone Exposed: No Treatment Notes Wound #3 (Foot) Wound Laterality: Dorsal, Right, Proximal Cleanser Byram Ancillary Kit - 15 Day Supply Discharge Instruction: Use supplies as instructed; Kit contains: (15) Saline Bullets; (15) 3x3 Gauze; [address] KIX, GREENHAGEN H ([id number]) [id number] Page 11 of 11 Discharge Instruction: Gently cleanse wound with antibacterial soap, rinse and pat dry prior to dressing wounds Peri-Wound Care Topical Primary Dressing Prisma 4.34 (in) Discharge Instruction: Moisten w/normal saline or sterile water; Cover wound as directed. Do not remove from wound bed. Secondary Dressing Zetuvit Plus 4x4 (in/in) Secured With Compression Wrap Compression Stockings Add-Ons Electronic Signature(s) Signed: 06/26/2023 8:00:25 AM By: [name] Entered By: [name] on 06/22/2023 12:32:45 -------------------------------------------------------------------------------- Vitals Details Patient Name: Date of Service: [name] HN H. 06/22/2023 3:15 PM Medical Record Number: [id number] Patient Account Number: [id number] Date of Birth/Sex: Treating [name]: 05-26-1951 (72 y.o. [name]) [name] Primary Care Jayion Schneck: [name] Other Clinician: Referring Magan Winnett: Treating Darrious Youman/Extender: [name], [name] in Treatment: 20 Vital Signs Time Taken: 15:24 Temperature (F): 97.7 Height (in): 66 Pulse (bpm): 70 Weight (lbs): 250 Respiratory Rate (breaths/min): 18 Body Mass Index (BMI): 40.3 Blood Pressure (mmHg): 138/55 Reference Range: 80 - 120 mg / dl Electronic Signature(s) Signed: 06/26/2023  8:00:25 AM By: [name] Entered By: [name] on 06/22/2023 12:24:50  Diabetes, Neuropathy Diabetes, Neuropathy Diabetes, Neuropathy 12/10/2022 12/10/2022 12/10/2022 Date Acquired: 20 20 20  [name] of Treatment: Open Open Open Wound Status: No No No Wound Recurrence: Yes No No Clustered Wound: 0.7x0.4x0.1 1.2x1.7x0.2 0.5x0.5x0.2 Measurements L x W x D (cm) 0.22 1.602 0.196 A (cm) : rea 0.022 0.32 0.039 Volume (cm) : 94.90% -85.40% 92.20% % Reduction in A rea: 94.80% -272.10% 84.50% % Reduction in Volume: Grade 1 Grade 1 Grade 1 Classification: None Present Medium Medium Exudate A mount: N/A Serosanguineous Serosanguineous Exudate Type: N/A red, brown red, brown Exudate Color: Medium (34-66%) Medium (34-66%) Medium (34-66%) Granulation A mount: Red Red, Pink Red Granulation Quality: None Present (0%) None Present (0%) Medium (34-66%) Necrotic A mount: Fat Layer (Subcutaneous Tissue): Yes Fat Layer (Subcutaneous Tissue): Yes Fat Layer (Subcutaneous Tissue): Yes Exposed Structures: Fascia: No Fascia: No Fascia: No [name] ([id number]) [id number] Page 5 of 11 Tendon: No Tendon: No Tendon: No Muscle: No Muscle: No Muscle: No Joint: No Joint: No Joint: No Bone: No Bone: No Bone: No Large (67-100%) Large (67-100%) Small (1-33%) Epithelialization: Treatment Notes Electronic  Signature(s) Signed: 06/26/2023 8:00:25 AM By: [name] Entered By: [name] on 06/22/2023 12:33:08 -------------------------------------------------------------------------------- Multi-Disciplinary Care Plan Details Patient Name: Date of Service: [name] HN H. 06/22/2023 3:15 PM Medical Record Number: [id number] Patient Account Number: [id number] Date of Birth/Sex: Treating [name]: 04-13-1951 (72 y.o. [name]) [name] Primary Care Aizza Santiago: [name] Other Clinician: Referring Ainsleigh Kakos: Treating Taren Toops/Extender: [name], [name] in Treatment: 20 Active Inactive Wound/Skin Impairment Nursing Diagnoses: Impaired tissue integrity Knowledge deficit related to ulceration/compromised skin integrity Goals: Patient/caregiver will verbalize understanding of skin care regimen Date Initiated: 02/01/2023 Date Inactivated: 02/28/2023 Target Resolution Date: 03/04/2023 Goal Status: Met Ulcer/skin breakdown will have a volume reduction of 30% by week 4 Date Initiated: 02/01/2023 Date Inactivated: 02/28/2023 Target Resolution Date: 03/04/2023 Goal Status: Met Ulcer/skin breakdown will have a volume reduction of 50% by week 8 Date Initiated: 02/01/2023 Date Inactivated: 04/11/2023 Target Resolution Date: 04/04/2023 Goal Status: Met Ulcer/skin breakdown will have a volume reduction of 80% by week 12 Date Initiated: 02/01/2023 Target Resolution Date: 07/05/2023 Goal Status: Active Interventions: Assess patient/caregiver ability to obtain necessary supplies Assess patient/caregiver ability to perform ulcer/skin care regimen upon admission and as needed Assess ulceration(s) every visit Provide education on ulcer and skin care Treatment Activities: Referred to DME Deklyn Trachtenberg for dressing supplies : 01/31/2023 Skin care regimen initiated : 01/31/2023 Notes: Electronic Signature(s) Signed: 06/26/2023 8:00:25 AM By: [name] Entered By: [name] on 06/22/2023 12:33:28 [name] ([id number]) [id number] Page 6 of 11 -------------------------------------------------------------------------------- Pain Assessment Details Patient Name: Date of Service: [name] 06/22/2023 3:15 PM Medical Record Number: [id number] Patient Account Number: [id number] Date of Birth/Sex: Treating [name]: November 16, 1950 (72 y.o. [name]) [name] Primary Care Verenice Westrich: [name] Other Clinician: Referring Tallia Moehring: Treating Kalise Fickett/Extender: [name], [name] in Treatment: 20 Active Problems Location of Pain Severity and Description of Pain Patient Has Paino No Site Locations Pain Management and Medication Current Pain Management: Electronic Signature(s) Signed: 06/26/2023 8:00:25 AM By: [name] Entered By: [name] on 06/22/2023 12:30:56 -------------------------------------------------------------------------------- Patient/Caregiver Education Details Patient Name: Date of Service: [name] 10/24/2024andnbsp3:15 PM Medical Record Number: [id number] Patient Account Number: [id number] Date of Birth/Gender: Treating [name]: 07-Apr-1951 (72 y.o. [name] Primary Care Physician: [name] Other Clinician: Referring Physician: Treating Physician/Extender: Ilyaas, Knack ([id number]) [id number] Page 7

## 2023-06-28 ENCOUNTER — Telehealth: Payer: Self-pay | Admitting: Family Medicine

## 2023-06-28 ENCOUNTER — Other Ambulatory Visit: Payer: Self-pay | Admitting: Family Medicine

## 2023-06-28 MED ORDER — TRESIBA FLEXTOUCH 100 UNIT/ML ~~LOC~~ SOPN: 50.0000 [IU] | PEN_INJECTOR | Freq: Every day | SUBCUTANEOUS | 2 refills | Status: DC

## 2023-06-28 NOTE — Telephone Encounter (Signed)
Refills sent as requested

## 2023-06-28 NOTE — Telephone Encounter (Signed)
Prescription Request  06/28/2023  LOV: 06/08/2023  What is the name of the medication or equipment? TRESIBA FLEXTOUCH 100 UNIT/ML FlexTouch Pen, has enough for today  Have you contacted your pharmacy to request a refill? Yes   Which pharmacy would you like this sent to?  [facility] - [place], [place] - [address] [place] [address] Phone: [phone] Fax: [phone]    Patient notified that their request is being sent to the clinical staff for review and that they should receive a response within 2 business days.   Please advise at Mobile [phone] (mobile)

## 2023-06-30 ENCOUNTER — Encounter: Payer: PPO | Attending: Physician Assistant | Admitting: Physician Assistant

## 2023-06-30 DIAGNOSIS — L97512 Non-pressure chronic ulcer of other part of right foot with fat layer exposed: Secondary | ICD-10-CM | POA: Diagnosis not present

## 2023-06-30 DIAGNOSIS — I480 Paroxysmal atrial fibrillation: Secondary | ICD-10-CM | POA: Insufficient documentation

## 2023-06-30 DIAGNOSIS — I2510 Atherosclerotic heart disease of native coronary artery without angina pectoris: Secondary | ICD-10-CM | POA: Diagnosis not present

## 2023-06-30 DIAGNOSIS — E1140 Type 2 diabetes mellitus with diabetic neuropathy, unspecified: Secondary | ICD-10-CM | POA: Insufficient documentation

## 2023-06-30 DIAGNOSIS — E11621 Type 2 diabetes mellitus with foot ulcer: Secondary | ICD-10-CM | POA: Insufficient documentation

## 2023-06-30 DIAGNOSIS — I509 Heart failure, unspecified: Secondary | ICD-10-CM | POA: Diagnosis not present

## 2023-06-30 DIAGNOSIS — I110 Hypertensive heart disease with heart failure: Secondary | ICD-10-CM | POA: Diagnosis not present

## 2023-06-30 DIAGNOSIS — E1151 Type 2 diabetes mellitus with diabetic peripheral angiopathy without gangrene: Secondary | ICD-10-CM | POA: Insufficient documentation

## 2023-06-30 DIAGNOSIS — L97412 Non-pressure chronic ulcer of right heel and midfoot with fat layer exposed: Secondary | ICD-10-CM | POA: Diagnosis not present

## 2023-06-30 NOTE — Progress Notes (Addendum)
SUDEYS, PRUNER ([id number]) [id number] Page 1 of 8 Visit Report for 06/30/2023 Chief Complaint Document Details Patient Name: Date of Service: [name] Medical Record Number: [id number] Patient Account Number: [id number] Date of Birth/Sex: Treating RN: [birth year] (72 y.o. [name]) Yevonne Pax Primary Care Provider: Kerby Nora Other Clinician: Referring Provider: Treating Provider/Extender: [name], [name] in Treatment: 21 Information Obtained from: Patient Chief Complaint Right foot ulcers Electronic Signature(s) Signed: 06/30/2023 10:54:55 AM By: Allen Derry PA-C Entered By: Allen Derry on 06/30/2023 10:54:54 -------------------------------------------------------------------------------- HPI Details Patient Name: Date of Service: [name] HN H. 06/30/2023 10:45 A [name] Medical Record Number: [id number] Patient Account Number: [id number] Date of Birth/Sex: Treating RN: 09/17/1950 (72 y.o. [name]) Yevonne Pax Primary Care Provider: Kerby Nora Other Clinician: Referring Provider: Treating Provider/Extender: [name], [name] in Treatment: 21 History of Present Illness HPI Description: 01-31-2023 upon evaluation today patient appears to be doing poorly currently in regard to wounds on his right heel, right dorsal foot, and right lateral foot. Subsequently he does have known peripheral vascular disease and again this is something that he needs to I think Checked formally. This is what the majority of the conversation today hinged around and the patient voiced understanding as far as that is concerned. Fortunately I do not see any signs of active infection locally nor systemically which is great news. Patient does have a history of diabetes mellitus type 2, atrial fibrillation for which she is on long-term anticoagulant Therapy, hypertension, and coronary artery disease. Patient's hemoglobin A1c most recently was  on 11-17-2022 and was 7.2 and currently he is on Eliquis and Plavix. 02-07-2023 upon evaluation today patient appears to be doing well currently in regard to his wounds all things considered I feel like we are still maintaining that does not seem like it is any worse is also not significantly better. I discussed with the patient that I do believe he would benefit from the arterial evaluation we still need to get this done as quickly as possible and subsequently we did put in a follow-up call with the vascular office today with regard to this. 02-14-2023 upon evaluation today patient appears to be doing a little better in regard to his wounds which are showing signs of loosening which is great news. With that being said he is experiencing an improvement overall in his symptoms and we are still waiting on the vascular evaluation want to get this result and consider whether or not his blood flow is good or not we will be able to make a better determination of next steps. For now we will try and avoid any aggressive sharp debridement to know that he has good arterial flow. [name], [name] ([id number]) [id number] Page 2 of 8 02-21-2023 upon evaluation today patient appears to be doing about the same in regard to his wound. Fortunately there does not appear to be any signs of active infection at this time which is great news. No fevers, chills, nausea, vomiting, or diarrhea. I did review patient's arterial study and it appears that he has pretty good flow in the left is not perfect but it is decent. On the right however he is definitely not doing nearly as good and I think that he is going to need to see one of the vascular doctors for further evaluation and treatment of this right leg in order to get these wounds to heal. 02-28-2023 upon evaluation today patient appears to be doing  well currently in regard to his wound. He has been tolerating the dressing changes without complication.  Fortunately I do not see any evidence of active infection locally nor systemically at this time. I do think that the eschar started to soften up and I would like to try to get some of this off to see if we can get things moving in the right direction. He is in agreement with this plan. 03-09-2023 upon evaluation today patient appears to be doing well currently in regard to his wound. He has been tolerating the dressing changes without complication. Fortunately there does not appear to be any signs of active infection locally nor systemically at this time which is great news. I do believe clearing out some of the necrotic debris last week has helped to a degree. 03-16-2023 upon evaluation today patient appears to be doing well currently in regard to his wounds. He is going to be having a vascular angiogram in order to open up blood flow and I think this is going to be very beneficial for him. Fortunately I do not see any evidence of active infection locally nor systemically which is great news. 7/25; patient with predominantly ischemic wounds to on his dorsal foot and 1 on the right heel. He is going for his angiogram on Monday. He has been using the Iodoflex to the wound and changing this dressing himself. 04-11-2023 upon evaluation today patient appears to be doing well currently in regard to his wounds. In fact he had the arterial procedure with vascular and the great news is he actually has much improved blood flow he had a 95% blockage of the popliteal as well as the posterior tibial based on the record review. Post procedure he now has a residual of only about 30% in the popliteal and closer around 10% in the posterior tibial. The blood flow is greatly improved and it is obvious based on what we are seeing. 04-20-2023 upon evaluation today patient appears to be doing well currently in regard to his wounds. They are actually showing signs of excellent improvement and to be honest now that he is got  good arterial flow going he is really healing quite nicely. Fortunately I do not see any signs of active infection locally or systemically which is great news. 8/29 this is a patient who has known arterial insufficiency. She has 2 wounds on the dorsal right foot and 1 on the back of the heel. She had an angiogram at the end of July and had follow-up arterial studies yesterday but I cannot pull up the arterial studies. We are using Prisma and Zituvimet and really doing quite well. The wounds are better 05-08-2023 upon evaluation today patient appears to be doing well currently in regard to his ulcers. He has been tolerating the dressing changes without complication. Fortunately there does not appear to be any signs of active infection looking or systemically at this time. 05-29-2023 upon evaluation today patient appears to be doing well currently in regard to his wounds are all pretty healthy appearing. With that being said unfortunately he is having some issues here with to the wounds that were previously pretty much closed having reopened. Either way I think that the wounds are healthy appearing. 06-06-2023 upon evaluation today patient's wounds appear to be doing well though the areas actually appear to be a little bit irritated due to what appears to be drainage from the Band-Aids. I think we need to go back to doing the Zetuvit and wrapping  as opposed to using the Band-Aids. 06-13-2023 upon evaluation today patient appears to be doing excellent in regard to all 3 wound locations. He has been tolerating the dressing changes without complication and in general I believe that he is making excellent headway towards closure which is great news. No fevers, chills, nausea, vomiting, or diarrhea. 06-22-2023 upon evaluation today patient actually appears to be making good headway towards closure I am actually very pleased with where we stand today. I do not see any signs of active infection locally or  systemically which is great news and in general I do think that we are making good headway towards complete closure which is great news. 06-30-2023 upon evaluation today patient appears to be doing well currently in regard to his wounds they are not getting quite a small sound like to see they are getting a little smaller here and there at set for the 1 on the distal foot right side which actually showing signs of plateauing. Working to see how things continue to move but if it does not significantly improved by next week I may consider looking into Apligraf for him as a possibility. The patient is in agreement with that plan. Electronic Signature(s) Signed: 06/30/2023 12:07:07 PM By: Allen Derry PA-C Entered By: Allen Derry on 06/30/2023 12:07:07 -------------------------------------------------------------------------------- Physical Exam Details Patient Name: Date of Service: [name] HN H. 06/30/2023 10:45 A [name] Medical Record Number: [id number] Patient Account Number: [id number] Date of Birth/Sex: Treating RN: 12/01/1950 (72 y.o. [name]) Yevonne Pax Primary Care Provider: Kerby Nora Other Clinician: Referring Provider: Treating Provider/Extender: [name], [name] in Treatment: [address] ([id number]) [id number] Page 3 of 8 Constitutional Well-nourished and well-hydrated in no acute distress. Respiratory normal breathing without difficulty. Psychiatric this patient is able to make decisions and demonstrates good insight into disease process. Alert and Oriented x 3. pleasant and cooperative. Notes Upon inspection patient's wound bed actually showed signs of being very well. There does not appear to be any signs of active infection at this time which is good news and there was no need for sharp debridement today. Overall I am extremely pleased with where we stand. Electronic Signature(s) Signed: 06/30/2023 12:07:28 PM By: Allen Derry  PA-C Entered By: Allen Derry on 06/30/2023 12:07:28 -------------------------------------------------------------------------------- Physician Orders Details Patient Name: Date of Service: [name] HN H. 06/30/2023 10:45 A [name] Medical Record Number: [id number] Patient Account Number: [id number] Date of Birth/Sex: Treating RN: 1951/08/03 (72 y.o. [name]) Yevonne Pax Primary Care Provider: Kerby Nora Other Clinician: Referring Provider: Treating Provider/Extender: [name], [name] in Treatment: 21 The following information was scribed by: Yevonne Pax The information was scribed for: Allen Derry Verbal / Phone Orders: No Diagnosis Coding ICD-10 Coding Code Description E11.621 Type 2 diabetes mellitus with foot ulcer L97.512 Non-pressure chronic ulcer of other part of right foot with fat layer exposed I25.10 Atherosclerotic heart disease of native coronary artery without angina pectoris I48.0 Paroxysmal atrial fibrillation Z79.01 Long term (current) use of anticoagulants I10 Essential (primary) hypertension Follow-up Appointments Return Appointment in 1 week. Bathing/ [employer] wounds with antibacterial soap and water. Anesthetic (Use 'Patient Medications' Section for Anesthetic Order Entry) Lidocaine applied to wound bed Edema Control - Orders / Instructions Elevate, Exercise Daily and A void Standing for Long Periods of Time. Elevate legs to the level of the heart and pump ankles as often as possible Elevate leg(s) parallel to the floor when sitting. Wound Treatment Wound #1R - Calcaneus Wound  Laterality: Right Cleanser: Byram Ancillary Kit - 15 Day Supply (Generic) Every Other Day/30 Days Discharge Instructions: Use supplies as instructed; Kit contains: (15) Saline Bullets; (15) 3x3 Gauze; 15 pr Gloves Cleanser: Soap and Water Every Other Day/30 Days [name], [name] ([id number]) [id number] Page 4 of 8 Discharge Instructions:  Gently cleanse wound with antibacterial soap, rinse and pat dry prior to dressing wounds Prim Dressing: Promogran Matrix 4.34 (in) Every Other Day/30 Days ary Discharge Instructions: Moisten w/normal saline or sterile water; Cover wound as directed. Do not remove from wound bed. Secondary Dressing: (BORDER) Zetuvit Plus SILICONE BORDER Dressing 4x4 (in/in) (Generic) Every Other Day/30 Days Discharge Instructions: Please do not put silicone bordered dressings under wraps. Use non-bordered dressing only. Wound #2R - Foot Wound Laterality: Dorsal, Right, Distal Cleanser: Byram Ancillary Kit - 15 Day Supply (Generic) Every Other Day/30 Days Discharge Instructions: Use supplies as instructed; Kit contains: (15) Saline Bullets; (15) 3x3 Gauze; 15 pr Gloves Cleanser: Soap and Water Every Other Day/30 Days Discharge Instructions: Gently cleanse wound with antibacterial soap, rinse and pat dry prior to dressing wounds Prim Dressing: Promogran Matrix 4.34 (in) Every Other Day/30 Days ary Discharge Instructions: Moisten w/normal saline or sterile water; Cover wound as directed. Do not remove from wound bed. Secondary Dressing: (BORDER) Zetuvit Plus SILICONE BORDER Dressing 4x4 (in/in) (Generic) Every Other Day/30 Days Discharge Instructions: Please do not put silicone bordered dressings under wraps. Use non-bordered dressing only. Wound #3 - Foot Wound Laterality: Dorsal, Right, Proximal Cleanser: Byram Ancillary Kit - 15 Day Supply (Generic) Every Other Day/30 Days Discharge Instructions: Use supplies as instructed; Kit contains: (15) Saline Bullets; (15) 3x3 Gauze; 15 pr Gloves Cleanser: Soap and Water Every Other Day/30 Days Discharge Instructions: Gently cleanse wound with antibacterial soap, rinse and pat dry prior to dressing wounds Prim Dressing: Promogran Matrix 4.34 (in) Every Other Day/30 Days ary Discharge Instructions: Moisten w/normal saline or sterile water; Cover wound as directed. Do not  remove from wound bed. Secondary Dressing: (BORDER) Zetuvit Plus SILICONE BORDER Dressing 4x4 (in/in) (Generic) Every Other Day/30 Days Discharge Instructions: Please do not put silicone bordered dressings under wraps. Use non-bordered dressing only. Electronic Signature(s) Signed: 06/30/2023 12:08:51 PM By: Allen Derry PA-C Signed: 07/12/2023 4:43:41 PM By: Yevonne Pax RN Entered By: Yevonne Pax on 06/30/2023 11:44:10 -------------------------------------------------------------------------------- Problem List Details Patient Name: Date of Service: [name] HN H. 06/30/2023 10:45 A [name] Medical Record Number: [id number] Patient Account Number: [id number] Date of Birth/Sex: Treating RN: 1950/10/02 (72 y.o. [name] Primary Care Provider: Kerby Nora Other Clinician: Referring Provider: Treating Provider/Extender: [name], [name] in Treatment: 21 Active Problems ICD-10 Encounter Code Description Active Date MDM Diagnosis E11.621 Type 2 diabetes mellitus with foot ulcer 01/31/2023 No Yes L97.512 Non-pressure chronic ulcer of other part of right foot with fat layer exposed 01/31/2023 No Yes [name], [name] ([id number]) [id number] Page 5 of 8 I25.10 Atherosclerotic heart disease of native coronary artery without angina pectoris 01/31/2023 No Yes I48.0 Paroxysmal atrial fibrillation 01/31/2023 No Yes Z79.01 Long term (current) use of anticoagulants 01/31/2023 No Yes I10 Essential (primary) hypertension 01/31/2023 No Yes Inactive Problems Resolved Problems Electronic Signature(s) Signed: 06/30/2023 10:54:50 AM By: Allen Derry PA-C Entered By: Allen Derry on 06/30/2023 10:54:50 -------------------------------------------------------------------------------- Progress Note Details Patient Name: Date of Service: [name] HN H. 06/30/2023 10:45 A [name] Medical Record Number: [id number] Patient Account Number: [id number] Date of Birth/Sex: Treating  RN: 1950/10/04 (72 y.o. [name]) Yevonne Pax Primary Care Provider: Kerby Nora  Other Clinician: Referring Provider: Treating Provider/Extender: [name], [name] in Treatment: 21 Subjective Chief Complaint Information obtained from Patient Right foot ulcers History of Present Illness (HPI) 01-31-2023 upon evaluation today patient appears to be doing poorly currently in regard to wounds on his right heel, right dorsal foot, and right lateral foot. Subsequently he does have known peripheral vascular disease and again this is something that he needs to I think Checked formally. This is what the majority of the conversation today hinged around and the patient voiced understanding as far as that is concerned. Fortunately I do not see any signs of active infection locally nor systemically which is great news. Patient does have a history of diabetes mellitus type 2, atrial fibrillation for which she is on long-term anticoagulant Therapy, hypertension, and coronary artery disease. Patient's hemoglobin A1c most recently was on 11-17-2022 and was 7.2 and currently he is on Eliquis and Plavix. 02-07-2023 upon evaluation today patient appears to be doing well currently in regard to his wounds all things considered I feel like we are still maintaining that does not seem like it is any worse is also not significantly better. I discussed with the patient that I do believe he would benefit from the arterial evaluation we still need to get this done as quickly as possible and subsequently we did put in a follow-up call with the vascular office today with regard to this. 02-14-2023 upon evaluation today patient appears to be doing a little better in regard to his wounds which are showing signs of loosening which is great news. With that being said he is experiencing an improvement overall in his symptoms and we are still waiting on the vascular evaluation want to get this result and consider whether or not  his blood flow is good or not we will be able to make a better determination of next steps. For now we will try and avoid any aggressive sharp debridement to know that he has good arterial flow. 02-21-2023 upon evaluation today patient appears to be doing about the same in regard to his wound. Fortunately there does not appear to be any signs of active infection at this time which is great news. No fevers, chills, nausea, vomiting, or diarrhea. I did review patient's arterial study and it appears that he has pretty good flow in the left is not perfect but it is decent. On the right however he is definitely not [name], [name] ([id number]) [id number] Page 6 of 8 doing nearly as good and I think that he is going to need to see one of the vascular doctors for further evaluation and treatment of this right leg in order to get these wounds to heal. 02-28-2023 upon evaluation today patient appears to be doing well currently in regard to his wound. He has been tolerating the dressing changes without complication. Fortunately I do not see any evidence of active infection locally nor systemically at this time. I do think that the eschar started to soften up and I would like to try to get some of this off to see if we can get things moving in the right direction. He is in agreement with this plan. 03-09-2023 upon evaluation today patient appears to be doing well currently in regard to his wound. He has been tolerating the dressing changes without complication. Fortunately there does not appear to be any signs of active infection locally nor systemically at this time which is great news. I do believe clearing out some of  the necrotic debris last week has helped to a degree. 03-16-2023 upon evaluation today patient appears to be doing well currently in regard to his wounds. He is going to be having a vascular angiogram in order to open up blood flow and I think this is going to be very  beneficial for him. Fortunately I do not see any evidence of active infection locally nor systemically which is great news. 7/25; patient with predominantly ischemic wounds to on his dorsal foot and 1 on the right heel. He is going for his angiogram on Monday. He has been using the Iodoflex to the wound and changing this dressing himself. 04-11-2023 upon evaluation today patient appears to be doing well currently in regard to his wounds. In fact he had the arterial procedure with vascular and the great news is he actually has much improved blood flow he had a 95% blockage of the popliteal as well as the posterior tibial based on the record review. Post procedure he now has a residual of only about 30% in the popliteal and closer around 10% in the posterior tibial. The blood flow is greatly improved and it is obvious based on what we are seeing. 04-20-2023 upon evaluation today patient appears to be doing well currently in regard to his wounds. They are actually showing signs of excellent improvement and to be honest now that he is got good arterial flow going he is really healing quite nicely. Fortunately I do not see any signs of active infection locally or systemically which is great news. 8/29 this is a patient who has known arterial insufficiency. She has 2 wounds on the dorsal right foot and 1 on the back of the heel. She had an angiogram at the end of July and had follow-up arterial studies yesterday but I cannot pull up the arterial studies. We are using Prisma and Zituvimet and really doing quite well. The wounds are better 05-08-2023 upon evaluation today patient appears to be doing well currently in regard to his ulcers. He has been tolerating the dressing changes without complication. Fortunately there does not appear to be any signs of active infection looking or systemically at this time. 05-29-2023 upon evaluation today patient appears to be doing well currently in regard to his wounds are  all pretty healthy appearing. With that being said unfortunately he is having some issues here with to the wounds that were previously pretty much closed having reopened. Either way I think that the wounds are healthy appearing. 06-06-2023 upon evaluation today patient's wounds appear to be doing well though the areas actually appear to be a little bit irritated due to what appears to be drainage from the Band-Aids. I think we need to go back to doing the Zetuvit and wrapping as opposed to using the Band-Aids. 06-13-2023 upon evaluation today patient appears to be doing excellent in regard to all 3 wound locations. He has been tolerating the dressing changes without complication and in general I believe that he is making excellent headway towards closure which is great news. No fevers, chills, nausea, vomiting, or diarrhea. 06-22-2023 upon evaluation today patient actually appears to be making good headway towards closure I am actually very pleased with where we stand today. I do not see any signs of active infection locally or systemically which is great news and in general I do think that we are making good headway towards complete closure which is great news. 06-30-2023 upon evaluation today patient appears to be doing well currently in  regard to his wounds they are not getting quite a small sound like to see they are getting a little smaller here and there at set for the 1 on the distal foot right side which actually showing signs of plateauing. Working to see how things continue to move but if it does not significantly improved by next week I may consider looking into Apligraf for him as a possibility. The patient is in agreement with that plan. Objective Constitutional Well-nourished and well-hydrated in no acute distress. Vitals Time Taken: 11:06 AM, Height: 66 in, Weight: 250 lbs, BMI: 40.3, Temperature: 97.7 F, Pulse: 74 bpm, Respiratory Rate: 18 breaths/min, Blood Pressure: 124/60  mmHg. Respiratory normal breathing without difficulty. Psychiatric this patient is able to make decisions and demonstrates good insight into disease process. Alert and Oriented x 3. pleasant and cooperative. General Notes: Upon inspection patient's wound bed actually showed signs of being very well. There does not appear to be any signs of active infection at this time which is good news and there was no need for sharp debridement today. Overall I am extremely pleased with where we stand. Integumentary (Hair, Skin) Wound #1R status is Open. Original cause of wound was Gradually Appeared. The date acquired was: 12/10/2022. The wound has been in treatment 21 weeks. The wound is located on the Right Calcaneus. The wound measures 0.7cm length x 0.4cm width x 0.1cm depth; 0.22cm^2 area and 0.022cm^3 volume. There is Fat Layer (Subcutaneous Tissue) exposed. There is no tunneling or undermining noted. There is a none present amount of drainage noted. There is large (67- 100%) red granulation within the wound bed. There is no necrotic tissue within the wound bed. Wound #2R status is Open. Original cause of wound was Gradually Appeared. The date acquired was: 12/10/2022. The wound has been in treatment 21 weeks. The wound is located on the Right,Distal,Dorsal Foot. The wound measures 1.2cm length x 1.7cm width x 0.2cm depth; 1.602cm^2 area and 0.32cm^3 volume. There is Fat Layer (Subcutaneous Tissue) exposed. There is no tunneling or undermining noted. There is a medium amount of serosanguineous drainage noted. There is large (67-100%) red, pink granulation within the wound bed. There is no necrotic tissue within the wound bed. [name], [name] ([id number]) [id number] Page 7 of 8 Wound #3 status is Open. Original cause of wound was Gradually Appeared. The date acquired was: 12/10/2022. The wound has been in treatment 21 weeks. The wound is located on the Right,Proximal,Dorsal Foot.  The wound measures 0.4cm length x 0.3cm width x 0.1cm depth; 0.094cm^2 area and 0.009cm^3 volume. There is Fat Layer (Subcutaneous Tissue) exposed. There is no tunneling or undermining noted. There is a medium amount of serosanguineous drainage noted. There is medium (34-66%) red granulation within the wound bed. There is a medium (34-66%) amount of necrotic tissue within the wound bed including Adherent Slough. 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 Bathing/ Shower/ Hygiene: Wash wounds with antibacterial soap and water. Anesthetic (Use 'Patient Medications' Section for Anesthetic Order Entry): Lidocaine applied to wound bed Edema Control - Orders / Instructions: Elevate, Exercise Daily and Avoid Standing for Long Periods of Time. Elevate legs to the level of the heart and pump ankles as often as possible Elevate leg(s)  parallel to the floor when sitting. WOUND #1R: - Calcaneus Wound Laterality: Right Cleanser: Byram Ancillary Kit - 15 Day Supply (Generic) Every Other Day/30 Days Discharge Instructions: Use supplies as instructed; Kit contains: (15) Saline Bullets; (15) 3x3 Gauze; 15 pr Gloves Cleanser: Soap and Water Every Other Day/30 Days Discharge Instructions: Gently cleanse wound with antibacterial soap, rinse and pat dry prior to dressing wounds Prim Dressing: Promogran Matrix 4.34 (in) Every Other Day/30 Days ary Discharge Instructions: Moisten w/normal saline or sterile water; Cover wound as directed. Do not remove from wound bed. Secondary Dressing: (BORDER) Zetuvit Plus SILICONE BORDER Dressing 4x4 (in/in) (Generic)  Every Other Day/30 Days Discharge Instructions: Please do not put silicone bordered dressings under wraps. Use non-bordered dressing only. WOUND #2R: - Foot Wound Laterality: Dorsal, Right, Distal Cleanser: Byram Ancillary Kit - 15 Day Supply (Generic) Every Other Day/30 Days Discharge Instructions: Use supplies as instructed; Kit contains: (15) Saline Bullets; (15) 3x3 Gauze; 15 pr Gloves Cleanser: Soap and Water Every Other Day/30 Days Discharge Instructions: Gently cleanse wound with antibacterial soap, rinse and pat dry prior to dressing wounds Prim Dressing: Promogran Matrix 4.34 (in) Every Other Day/30 Days ary Discharge Instructions: Moisten w/normal saline or sterile water; Cover wound as directed. Do not remove from wound bed. Secondary Dressing: (BORDER) Zetuvit Plus SILICONE BORDER Dressing 4x4 (in/in) (Generic) Every Other Day/30 Days Discharge Instructions: Please do not put silicone bordered dressings under wraps. Use non-bordered dressing only. WOUND #3: - Foot Wound Laterality: Dorsal, Right, Proximal Cleanser: Byram Ancillary Kit - 15 Day Supply (Generic) Every Other Day/30 Days Discharge Instructions: Use supplies as instructed; Kit contains: (15) Saline Bullets; (15) 3x3 Gauze; 15 pr Gloves Cleanser: Soap and Water Every Other Day/30 Days Discharge Instructions: Gently cleanse wound with antibacterial soap, rinse and pat dry prior to dressing wounds Prim Dressing: Promogran Matrix 4.34 (in) Every Other Day/30 Days ary Discharge Instructions: Moisten w/normal saline or sterile water; Cover wound as directed. Do not remove from wound bed. Secondary Dressing: (BORDER) Zetuvit Plus SILICONE BORDER Dressing 4x4 (in/in) (Generic) Every Other Day/30 Days Discharge Instructions: Please do not put silicone bordered dressings under wraps. Use non-bordered dressing only. 1. I would recommend that we have the patient continue to monitor for any evidence of infection or worsening overall  at this point. I do believe that the patient is making good headway with the Prisma with regard to the 2 wounds on the heel and the proximal foot. The distal foot wound is not quite as improved as the other 2 but nonetheless does not appear to be doing too poorly at all at this point. 2. I am going recommend as well that the patient should continue to monitor for any signs of infection or worsening. Office if anything changes he knows to contact the office and let me know. We will see patient back for reevaluation in 1 week here in the clinic. If anything worsens or changes patient will contact our office for additional recommendations. Electronic Signature(s) Signed: 06/30/2023 12:08:18 PM By: Allen Derry PA-C Entered By: Allen Derry on 06/30/2023 12:08:18 [name] ([id number]) [id number] Page 8 of 8 -------------------------------------------------------------------------------- SuperBill Details Patient Name: Date of Service: [name] 06/30/2023 Medical Record Number: [id number] Patient Account Number: [id number] Date of Birth/Sex: Treating RN: Jun 25, 1951 (72 y.o. [name]) Yevonne Pax Primary Care Provider: Kerby Nora Other Clinician: Referring Provider: Treating Provider/Extender: [name], [name] in Treatment: 21 Diagnosis Coding ICD-10 Codes Code Description E11.621 Type 2 diabetes mellitus  with foot ulcer L97.512 Non-pressure chronic ulcer of other part of right foot with fat layer exposed I25.10 Atherosclerotic heart disease of native coronary artery without angina pectoris I48.0 Paroxysmal atrial fibrillation Z79.01 Long term (current) use of anticoagulants I10 Essential (primary) hypertension Facility Procedures : CPT4 Code: [id number] Description: 99214 - WOUND CARE VISIT-LEV 4 EST PT Modifier: Quantity: 1 Physician Procedures : CPT4 Code Description Modifier [id number] - WC PHYS LEVEL 3 - EST PT ICD-10 Diagnosis  Description E11.621 Type 2 diabetes mellitus with foot ulcer L97.512 Non-pressure chronic ulcer of other part of right foot with fat layer exposed I25.10  Atherosclerotic heart disease of native coronary artery without angina pectoris I48.0 Paroxysmal atrial fibrillation Quantity: 1 Electronic Signature(s) Signed: 06/30/2023 12:08:29 PM By: Allen Derry PA-C Entered By: Allen Derry on 06/30/2023 12:08:28

## 2023-06-30 NOTE — Progress Notes (Addendum)
[name], SON ([id number]) [id number] Page 1 of 11 Visit Report for 06/30/2023 Arrival Information Details Patient Name: Date of Service: [name] [facility] H. 06/30/2023 10:45 A M Medical Record Number: [id number] Patient Account Number: [id number] Date of Birth/Sex: Treating RN: 1950/12/09 (72 y.o. [name]) Yevonne Pax Primary Care Kourtney Montesinos: Kerby Nora Other Clinician: Referring Abdulai Blaylock: Treating Teeghan Hammer/Extender: [name], [name] in Treatment: 21 Visit Information History Since Last Visit Added or deleted any medications: No Patient Arrived: [name] Any new allergies or adverse reactions: No Arrival Time: 11:03 Had a fall or experienced change in No Accompanied By: self activities of daily living that may affect Transfer Assistance: None risk of falls: Patient Identification Verified: Yes Signs or symptoms of abuse/neglect since last visito No Secondary Verification Process Completed: Yes Hospitalized since last visit: No Patient Requires Transmission-Based Precautions: No Implantable device outside of the clinic excluding No Patient Has Alerts: Yes cellular tissue based products placed in the center Patient Alerts: Patient on Blood Thinner since last visit: Diabetes type 2 Has Dressing in Place as Prescribed: Yes Eliquis/Plavix Pain Present Now: No ABI R 1.23 TBI 0.57 ABI L 0.98 TBI 0.80 Electronic Signature(s) Signed: 07/12/2023 4:43:41 PM By: Yevonne Pax RN Entered By: Yevonne Pax on 06/30/2023 08:06:15 -------------------------------------------------------------------------------- Clinic Level of Care Assessment Details Patient Name: Date of Service: [name] [facility] H. 06/30/2023 10:45 A M Medical Record Number: [id number] Patient Account Number: [id number] Date of Birth/Sex: Treating RN: Oct 20, 1950 (72 y.o. [name]) Yevonne Pax Primary Care Takiya Belmares: Kerby Nora Other Clinician: Referring Bakary Bramer: Treating Maureen Duesing/Extender: [name], [name] in Treatment: 21 Clinic Level of Care Assessment Items TOOL 4 Quantity Score X- 1 0 Use when only an EandM is performed on FOLLOW-UP visit ASSESSMENTS - Nursing Assessment / Reassessment X- 1 10 Reassessment of Co-morbidities (includes updates in patient status) X- 1 5 Reassessment of Adherence to Treatment Plan [name], [name] ([id number]) [id number] Page 2 of 11 ASSESSMENTS - Wound and Skin A ssessment / Reassessment []  - Simple Wound Assessment / Reassessment - one wound 0 X- 3 5 Complex Wound Assessment / Reassessment - multiple wounds []  - 0 Dermatologic / Skin Assessment (not related to wound area) ASSESSMENTS - Focused Assessment []  - 0 Circumferential Edema Measurements - multi extremities []  - 0 Nutritional Assessment / Counseling / Intervention []  - 0 Lower Extremity Assessment (monofilament, tuning fork, pulses) []  - 0 Peripheral Arterial Disease Assessment (using hand held doppler) ASSESSMENTS - Ostomy and/or Continence Assessment and Care []  - 0 Incontinence Assessment and Management []  - 0 Ostomy Care Assessment and Management (repouching, etc.) PROCESS - Coordination of Care X - Simple Patient / Family Education for ongoing care 1 15 []  - 0 Complex (extensive) Patient / Family Education for ongoing care []  - 0 Staff obtains Chiropractor, Records, T Results / Process Orders est []  - 0 Staff telephones HHA, Nursing Homes / Clarify orders / etc []  - 0 Routine Transfer to another Facility (non-emergent condition) []  - 0 Routine Hospital Admission (non-emergent condition) []  - 0 New Admissions / Manufacturing engineer / Ordering NPWT Apligraf, etc. , []  - 0 Emergency Hospital Admission (emergent condition) X- 1 10 Simple Discharge Coordination []  - 0 Complex (extensive) Discharge Coordination PROCESS - Special Needs []  - 0 Pediatric / Minor Patient Management []  - 0 Isolation Patient Management []  -  0 Hearing / Language / Visual special needs []  - 0 Assessment of Community assistance (transportation, D/C planning, etc.) []  - 0 Additional assistance / Altered  mentation []  - 0 Support Surface(s) Assessment (bed, cushion, seat, etc.) INTERVENTIONS - Wound Cleansing / Measurement []  - 0 Simple Wound Cleansing - one wound X- 3 5 Complex Wound Cleansing - multiple wounds X- 1 5 Wound Imaging (photographs - any number of wounds) []  - 0 Wound Tracing (instead of photographs) []  - 0 Simple Wound Measurement - one wound X- 3 5 Complex Wound Measurement - multiple wounds INTERVENTIONS - Wound Dressings X - Small Wound Dressing one or multiple wounds 3 10 []  - 0 Medium Wound Dressing one or multiple wounds []  - 0 Large Wound Dressing one or multiple wounds []  - 0 Application of Medications - topical []  - 0 Application of Medications - injection INTERVENTIONS - Miscellaneous []  - 0 External ear exam MOAYAD, MANTLE ([id number]) [id number] Page 3 of 11 []  - 0 Specimen Collection (cultures, biopsies, blood, body fluids, etc.) []  - 0 Specimen(s) / Culture(s) sent or taken to Lab for analysis []  - 0 Patient Transfer (multiple staff / Michiel Sites Lift / Similar devices) []  - 0 Simple Staple / Suture removal (25 or less) []  - 0 Complex Staple / Suture removal (26 or more) []  - 0 Hypo / Hyperglycemic Management (close monitor of Blood Glucose) []  - 0 Ankle / Brachial Index (ABI) - do not check if billed separately X- 1 5 Vital Signs Has the patient been seen at the hospital within the last three years: Yes Total Score: 125 Level Of Care: New/Established - Level 4 Electronic Signature(s) Signed: 07/12/2023 4:43:41 PM By: Yevonne Pax RN Entered By: Yevonne Pax on 06/30/2023 08:30:41 -------------------------------------------------------------------------------- Complex / Palliative Patient Assessment Details Patient Name: Date of Service: [name]  [facility] H. 06/30/2023 10:45 A M Medical Record Number: [id number] Patient Account Number: [id number] Date of Birth/Sex: Treating RN: Apr 29, 1951 (72 y.o. [name]) Yevonne Pax Primary Care Orel Cooler: Kerby Nora Other Clinician: Referring Alyiah Ulloa: Treating Oliviya Gilkison/Extender: [name], [name] in Treatment: 21 Complex Wound Management Criteria Patient has remarkable or complex co-morbidities requiring medications or treatments that extend wound healing times. Examples: Diabetes mellitus with chronic renal failure or end stage renal disease requiring dialysis Advanced or poorly controlled rheumatoid arthritis Diabetes mellitus and end stage chronic obstructive pulmonary disease Active cancer with current chemo- or radiation therapy DM HTN , CAD Palliative Wound Management Criteria Care Approach Wound Care Plan: Complex Wound Management Electronic Signature(s) Signed: 07/05/2023 9:40:45 AM By: Yevonne Pax RN Signed: 07/06/2023 11:25:01 AM By: Allen Derry PA-C Entered By: Yevonne Pax on 07/05/2023 06:40:45 [name] ([id number]) [id number] Page 4 of 11 -------------------------------------------------------------------------------- Encounter Discharge Information Details Patient Name: Date of Service: [name] [facility] H. 06/30/2023 10:45 A M Medical Record Number: [id number] Patient Account Number: [id number] Date of Birth/Sex: Treating RN: [birth year] (72 y.o. [name]) Yevonne Pax Primary Care Saleha Kalp: Kerby Nora Other Clinician: Referring Esperansa Sarabia: Treating Keni Elison/Extender: [name], [name] in Treatment: 21 Encounter Discharge Information Items Discharge Condition: Stable Ambulatory Status: Cane Discharge Destination: Home Transportation: Private Auto Accompanied By: self Schedule Follow-up Appointment: Yes Clinical Summary of Care: Electronic Signature(s) Signed: 06/30/2023 11:43:03 AM By: Yevonne Pax RN Entered By: Yevonne Pax on  06/30/2023 08:43:03 -------------------------------------------------------------------------------- Lower Extremity Assessment Details Patient Name: Date of Service: [name] [facility]. 06/30/2023 10:45 A M Medical Record Number: [id number] Patient Account Number: [id number] Date of Birth/Sex: Treating RN: 1951-08-01 (72 y.o. [name] Primary Care Sedonia Kitner: Kerby Nora Other Clinician: Referring Makayla Lanter: Treating Frimy Uffelman/Extender: [name], [name] in Treatment: 21 Electronic Signature(s)  Signed: 06/30/2023 11:22:54 AM By: Yevonne Pax RN Entered By: Yevonne Pax on 06/30/2023 08:22:54 [name] ([id number]) [id number] Page 5 of 11 -------------------------------------------------------------------------------- Multi Wound Chart Details Patient Name: Date of Service: [name] [facility] H. 06/30/2023 10:45 A M Medical Record Number: [id number] Patient Account Number: [id number] Date of Birth/Sex: Treating RN: 1951-07-12 (72 y.o. [name]) Yevonne Pax Primary Care Liberti Appleton: Kerby Nora Other Clinician: Referring Shanik Brookshire: Treating Diona Peregoy/Extender: [name], [name] in Treatment: 21 Vital Signs Height(in): 66 Pulse(bpm): 74 Weight(lbs): 250 Blood Pressure(mmHg): 124/60 Body Mass Index(BMI): 40.3 Temperature(F): 97.7 Respiratory Rate(breaths/min): 18 [1R:Photos:] [2R:No Photos] [3:No Photos] Right Calcaneus Right, Distal, Dorsal Foot Right, Proximal, Dorsal Foot Wound Location: Gradually Appeared Gradually Appeared Gradually Appeared Wounding Event: Diabetic Wound/Ulcer of the Lower Diabetic Wound/Ulcer of the Lower Diabetic Wound/Ulcer of the Lower Primary Etiology: Extremity Extremity Extremity Congestive Heart Failure, Coronary Congestive Heart Failure, Coronary Congestive Heart Failure, Coronary Comorbid History: Artery Disease, Hypertension, Type II Artery Disease, Hypertension, Type II Artery Disease, Hypertension,  Type II Diabetes, Neuropathy Diabetes, Neuropathy Diabetes, Neuropathy 12/10/2022 12/10/2022 12/10/2022 Date Acquired: 21 21 21  Weeks of Treatment: Open Open Open Wound Status: No No No Wound Recurrence: Yes No No Clustered Wound: 0.7x0.4x0.1 1.2x1.7x0.2 0.4x0.3x0.1 Measurements L x W x D (cm) 0.22 1.602 0.094 A (cm) : rea 0.022 0.32 0.009 Volume (cm) : 94.90% -85.40% 96.30% % Reduction in A rea: 94.80% -272.10% 96.40% % Reduction in Volume: Grade 1 Grade 1 Grade 1 Classification: None Present Medium Medium Exudate A mount: N/A Serosanguineous Serosanguineous Exudate Type: N/A red, brown red, brown Exudate Color: Large (67-100%) Large (67-100%) Medium (34-66%) Granulation A mount: Red Red, Pink Red Granulation Quality: None Present (0%) None Present (0%) Medium (34-66%) Necrotic A mount: Fat Layer (Subcutaneous Tissue): Yes Fat Layer (Subcutaneous Tissue): Yes Fat Layer (Subcutaneous Tissue): Yes Exposed Structures: Fascia: No Fascia: No Fascia: No Tendon: No Tendon: No Tendon: No Muscle: No Muscle: No Muscle: No Joint: No Joint: No Joint: No Bone: No Bone: No Bone: No Large (67-100%) Large (67-100%) Small (1-33%) Epithelialization: Treatment Notes Electronic Signature(s) Signed: 07/12/2023 4:43:41 PM By: Yevonne Pax RN Previous Signature: 06/30/2023 11:22:59 AM Version By: Yevonne Pax RN Entered By: Yevonne Pax on 06/30/2023 08:28:17 [name] ([id number]) [id number] Page 6 of 11 -------------------------------------------------------------------------------- Multi-Disciplinary Care Plan Details Patient Name: Date of Service: [name] [facility] H. 06/30/2023 10:45 A M Medical Record Number: [id number] Patient Account Number: [id number] Date of Birth/Sex: Treating RN: 1951-01-05 (72 y.o. [name]) Yevonne Pax Primary Care Eulogia Dismore: Kerby Nora Other Clinician: Referring Cleo Villamizar: Treating Jamilee Lafosse/Extender: [name],  [name] in Treatment: 21 Active Inactive Wound/Skin Impairment Nursing Diagnoses: Impaired tissue integrity Knowledge deficit related to ulceration/compromised skin integrity Goals: Patient/caregiver will verbalize understanding of skin care regimen Date Initiated: 02/01/2023 Date Inactivated: 02/28/2023 Target Resolution Date: 03/04/2023 Goal Status: Met Ulcer/skin breakdown will have a volume reduction of 30% by week 4 Date Initiated: 02/01/2023 Date Inactivated: 02/28/2023 Target Resolution Date: 03/04/2023 Goal Status: Met Ulcer/skin breakdown will have a volume reduction of 50% by week 8 Date Initiated: 02/01/2023 Date Inactivated: 04/11/2023 Target Resolution Date: 04/04/2023 Goal Status: Met Ulcer/skin breakdown will have a volume reduction of 80% by week 12 Date Initiated: 02/01/2023 Target Resolution Date: 07/05/2023 Goal Status: Active Interventions: Assess patient/caregiver ability to obtain necessary supplies Assess patient/caregiver ability to perform ulcer/skin care regimen upon admission and as needed Assess ulceration(s) every visit Provide education on ulcer and skin care Treatment Activities: Referred to DME Keandra Medero for dressing supplies :  01/31/2023 Skin care regimen initiated : 01/31/2023 Notes: Electronic Signature(s) Signed: 06/30/2023 11:23:08 AM By: Yevonne Pax RN Entered By: Yevonne Pax on 06/30/2023 08:23:08 EDI, HEITZMANN ([id number]) [id number] Page 7 of 11 -------------------------------------------------------------------------------- Pain Assessment Details Patient Name: Date of Service: [name] [facility] H. 06/30/2023 10:45 A M Medical Record Number: [id number] Patient Account Number: [id number] Date of Birth/Sex: Treating RN: Dec 31, 1950 (72 y.o. [name]) Yevonne Pax Primary Care Anice Wilshire: Kerby Nora Other Clinician: Referring Cole Klugh: Treating Tora Prunty/Extender: [name], [name] in Treatment: 21 Active Problems Location  of Pain Severity and Description of Pain Patient Has Paino No Site Locations Pain Management and Medication Current Pain Management: Electronic Signature(s) Signed: 07/12/2023 4:43:41 PM By: Yevonne Pax RN Entered By: Yevonne Pax on 06/30/2023 08:11:29 -------------------------------------------------------------------------------- Patient/Caregiver Education Details Patient Name: Date of Service: [name] [facility] H. 11/1/2024andnbsp10:45 A M Medical Record Number: [id number] Patient Account Number: [id number] Date of Birth/Gender: Treating RN: Mar 12, 1951 (72 y.o. [name] Primary Care Physician: Kerby Nora Other Clinician: Referring Physician: Treating Physician/Extender: [name], [name] in Treatment: 21 Education Assessment Education Provided To: Patient Education Topics Provided Wound/Skin Impairment: Handouts: Caring for Your Ulcer Methods: Explain/Verbal Responses: State content correctly MATHHEW, DOODY ([id number]) [id number] Page 8 of 11 Electronic Signature(s) Signed: 07/12/2023 4:43:41 PM By: Yevonne Pax RN Entered By: Yevonne Pax on 06/30/2023 08:23:20 -------------------------------------------------------------------------------- Wound Assessment Details Patient Name: Date of Service: [name] [facility] H. 06/30/2023 10:45 A M Medical Record Number: [id number] Patient Account Number: [id number] Date of Birth/Sex: Treating RN: September 22, 1950 (72 y.o. [name]) Yevonne Pax Primary Care Javeah Loeza: Kerby Nora Other Clinician: Referring [name]: Treating [name]/Extender: [name], [name] in Treatment: 21 Wound Status Wound Number: [id number] Primary Diabetic Wound/Ulcer of the Lower Extremity Etiology: Wound Location: Right Calcaneus Wound Open Wounding Event: Gradually Appeared Status: Date Acquired: 12/10/2022 Comorbid Congestive Heart Failure, Coronary Artery Disease, Weeks Of Treatment: 21 History:  Hypertension, Type II Diabetes, Neuropathy Clustered Wound: Yes Photos Wound Measurements Length: (cm) 0.7 Width: (cm) 0.4 Depth: (cm) 0.1 Area: (cm) 0.22 Volume: (cm) 0.022 % Reduction in Area: 94.9% % Reduction in Volume: 94.8% Epithelialization: Large (67-100%) Tunneling: No Undermining: No Wound Description Classification: Grade 1 Exudate Amount: None Present Foul Odor After Cleansing: No Slough/Fibrino No Wound Bed Granulation Amount: Large (67-100%) Exposed Structure Granulation Quality: Red Fascia Exposed: No Necrotic Amount: None Present (0%) Fat Layer (Subcutaneous Tissue) Exposed: Yes Tendon Exposed: No Muscle Exposed: No Joint Exposed: No Bone Exposed: No Electronic Signature(sTANNAR, WATKIN ([id number]) [id number] Page 9 of 11 Signed: 07/12/2023 4:43:41 PM By: Yevonne Pax RN Entered By: Yevonne Pax on 06/30/2023 08:19:59 -------------------------------------------------------------------------------- Wound Assessment Details Patient Name: Date of Service: [name] [facility] H. 06/30/2023 10:45 A M Medical Record Number: [id number] Patient Account Number: [id number] Date of Birth/Sex: Treating RN: 1951/07/31 (72 y.o. [name]) Yevonne Pax Primary Care Odas Ozer: Kerby Nora Other Clinician: Referring Roxine Whittinghill: Treating Kennedie Pardoe/Extender: [name], [name] in Treatment: 21 Wound Status Wound Number: 2R Primary Diabetic Wound/Ulcer of the Lower Extremity Etiology: Wound Location: Right, Distal, Dorsal Foot Wound Open Wounding Event: Gradually Appeared Status: Date Acquired: 12/10/2022 Comorbid Congestive Heart Failure, Coronary Artery Disease, Weeks Of Treatment: 21 History: Hypertension, Type II Diabetes, Neuropathy Clustered Wound: No Wound Measurements Length: (cm) 1.2 Width: (cm) 1.7 Depth: (cm) 0.2 Area: (cm) 1.602 Volume: (cm) 0.32 % Reduction in Area: -85.4% % Reduction in Volume: -272.1% Epithelialization:  Large (67-100%) Tunneling: No Undermining: No Wound Description Classification: Grade 1 Exudate Amount: Medium  Exudate Type: Serosanguineous Exudate Color: red, brown Foul Odor After Cleansing: No Slough/Fibrino No Wound Bed Granulation Amount: Large (67-100%) Exposed Structure Granulation Quality: Red, Pink Fascia Exposed: No Necrotic Amount: None Present (0%) Fat Layer (Subcutaneous Tissue) Exposed: Yes Tendon Exposed: No Muscle Exposed: No Joint Exposed: No Bone Exposed: No Electronic Signature(s) Signed: 06/30/2023 11:22:20 AM By: Yevonne Pax RN Entered By: Yevonne Pax on 06/30/2023 08:22:20 [name] ([id number]) [id number] Page 10 of 11 -------------------------------------------------------------------------------- Wound Assessment Details Patient Name: Date of Service: [name] [facility] H. 06/30/2023 10:45 A M Medical Record Number: [id number] Patient Account Number: [id number] Date of Birth/Sex: Treating RN: 1951/06/24 (72 y.o. [name]) Yevonne Pax Primary Care Saige Busby: Kerby Nora Other Clinician: Referring Casmere Hollenbeck: Treating Nicolina Hirt/Extender: [name], [name] in Treatment: 21 Wound Status Wound Number: 3 Primary Diabetic Wound/Ulcer of the Lower Extremity Etiology: Wound Location: Right, Proximal, Dorsal Foot Wound Open Wounding Event: Gradually Appeared Status: Date Acquired: 12/10/2022 Comorbid Congestive Heart Failure, Coronary Artery Disease, Weeks Of Treatment: 21 History: Hypertension, Type II Diabetes, Neuropathy Clustered Wound: No Wound Measurements Length: (cm) 0.4 Width: (cm) 0.3 Depth: (cm) 0.1 Area: (cm) 0.094 Volume: (cm) 0.009 % Reduction in Area: 96.3% % Reduction in Volume: 96.4% Epithelialization: Small (1-33%) Tunneling: No Undermining: No Wound Description Classification: Grade 1 Exudate Amount: Medium Exudate Type: Serosanguineous Exudate Color: red, brown Foul Odor After Cleansing:  No Slough/Fibrino Yes Wound Bed Granulation Amount: Medium (34-66%) Exposed Structure Granulation Quality: Red Fascia Exposed: No Necrotic Amount: Medium (34-66%) Fat Layer (Subcutaneous Tissue) Exposed: Yes Necrotic Quality: Adherent Slough Tendon Exposed: No Muscle Exposed: No Joint Exposed: No Bone Exposed: No Electronic Signature(s) Signed: 06/30/2023 11:22:43 AM By: Yevonne Pax RN Entered By: Yevonne Pax on 06/30/2023 08:22:43 -------------------------------------------------------------------------------- Vitals Details Patient Name: Date of Service: [name] [facility] H. 06/30/2023 10:45 A M Medical Record Number: [id number] Patient Account Number: [id number] Date of Birth/Sex: Treating RN: [birth year] (72 y.o. [name]) Yevonne Pax Primary Care Tambra Muller: Kerby Nora Other Clinician: Referring Daily Crate: Treating Bradlee Bridgers/Extender: [name], [name] in Treatment: 21 Vital Signs Time Taken: 11:06 Temperature (F): 97.7 Height (in): 66 Pulse (bpm): 74 Weight (lbs): 250 Respiratory Rate (breaths/min): 18 Body Mass Index (BMI): 40.3 Blood Pressure (mmHg): 124/60 Reference Range: 80 - 120 mg / dl AYTHAN, KYLLO ([id number]) [id number] Page 11 of 11 Electronic Signature(s) Signed: 07/12/2023 4:43:41 PM By: Yevonne Pax RN Entered By: Yevonne Pax on 06/30/2023 08:11:16

## 2023-07-04 ENCOUNTER — Ambulatory Visit: Payer: PPO | Admitting: Physician Assistant

## 2023-07-05 ENCOUNTER — Other Ambulatory Visit: Payer: Self-pay | Admitting: Family Medicine

## 2023-07-11 ENCOUNTER — Encounter: Payer: PPO | Admitting: Physician Assistant

## 2023-07-11 DIAGNOSIS — E11621 Type 2 diabetes mellitus with foot ulcer: Secondary | ICD-10-CM | POA: Diagnosis not present

## 2023-07-11 DIAGNOSIS — L97412 Non-pressure chronic ulcer of right heel and midfoot with fat layer exposed: Secondary | ICD-10-CM | POA: Diagnosis not present

## 2023-07-11 NOTE — Progress Notes (Signed)
WELLINGTON, AMLIN ([id number]) [id number] Page 1 of 10 Visit Report for 07/11/2023 Chief Complaint Document Details Patient Name: Date of Service: [name] [facility] H. 07/11/2023 3:15 PM Medical Record Number: [id number] Patient Account Number: [id number] Date of Birth/Sex: Treating RN: 20-May-1951 (72 y.o. [name]) Yevonne Pax Primary Care Provider: Kerby Nora Other Clinician: Referring Provider: Treating Provider/Extender: [name], [name] in Treatment: 23 Information Obtained from: Patient Chief Complaint Right foot ulcers Electronic Signature(s) Signed: 07/11/2023 3:51:22 PM By: Allen Derry PA-C Entered By: Allen Derry on 07/11/2023 15:51:22 -------------------------------------------------------------------------------- Debridement Details Patient Name: Date of Service: [name] [facility] H. 07/11/2023 3:15 PM Medical Record Number: [id number] Patient Account Number: [id number] Date of Birth/Sex: Treating RN: 11/24/1950 (72 y.o. [name]) Yevonne Pax Primary Care Provider: Kerby Nora Other Clinician: Referring Provider: Treating Provider/Extender: [name], [name] in Treatment: 23 Debridement Performed for Assessment: Wound #1R Right Calcaneus Performed By: Physician Allen Derry, PA-C Debridement Type: Debridement Severity of Tissue Pre Debridement: Fat layer exposed Level of Consciousness (Pre-procedure): Awake and Alert Pre-procedure Verification/Time Out Yes - 15:50 Taken: Start Time: 15:50 Percent of Wound Bed Debrided: 100% T Area Debrided (cm): otal 0.16 Tissue and other material debrided: Viable, Callus, Slough, Subcutaneous, Slough Level: Skin/Subcutaneous Tissue Debridement Description: Excisional Instrument: Curette Bleeding: Minimum Hemostasis Achieved: Pressure End Time: 15:56 Procedural Pain: 0 Post Procedural PainSIERRA, WENZLICK ([id number]) [id number] Page 2 of 10 Response to  Treatment: Procedure was tolerated well Level of Consciousness (Post- Awake and Alert procedure): Post Debridement Measurements of Total Wound Length: (cm) 0.5 Width: (cm) 0.4 Depth: (cm) 0.1 Volume: (cm) 0.016 Character of Wound/Ulcer Post Debridement: Improved Severity of Tissue Post Debridement: Fat layer exposed Post Procedure Diagnosis Same as Pre-procedure Electronic Signature(s) Signed: 07/12/2023 4:42:48 PM By: Yevonne Pax RN Signed: 07/13/2023 7:57:50 AM By: Allen Derry PA-C Entered By: Yevonne Pax on 07/11/2023 15:57:07 -------------------------------------------------------------------------------- Debridement Details Patient Name: Date of Service: [name] [facility] H. 07/11/2023 3:15 PM Medical Record Number: [id number] Patient Account Number: [id number] Date of Birth/Sex: Treating RN: 1951/03/14 (72 y.o. [name]) Yevonne Pax Primary Care Provider: Kerby Nora Other Clinician: Referring Provider: Treating Provider/Extender: [name], [name] in Treatment: 23 Debridement Performed for Assessment: Wound #2R Right,Distal,Dorsal Foot Performed By: Physician Allen Derry, PA-C Debridement Type: Debridement Severity of Tissue Pre Debridement: Fat layer exposed Level of Consciousness (Pre-procedure): Awake and Alert Pre-procedure Verification/Time Out Yes - 15:50 Taken: Start Time: 15:50 Percent of Wound Bed Debrided: 100% T Area Debrided (cm): otal 1.32 Tissue and other material debrided: Viable, Callus, Slough, Subcutaneous, Slough Level: Skin/Subcutaneous Tissue Debridement Description: Excisional Instrument: Curette Bleeding: Minimum Hemostasis Achieved: Pressure End Time: 15:56 Procedural Pain: 0 Post Procedural Pain: 0 Response to Treatment: Procedure was tolerated well Level of Consciousness (Post- Awake and Alert procedure): Post Debridement Measurements of Total Wound Length: (cm) 1.2 Width: (cm) 1.4 Depth: (cm) 0.2 Volume: (cm)  0.264 Character of Wound/Ulcer Post Debridement: Improved Severity of Tissue Post Debridement: Fat layer exposed Post Procedure Diagnosis Same as KAREY, [place] ([id number]) [id number] Page 3 of 10 Electronic Signature(s) Signed: 07/12/2023 4:42:48 PM By: Yevonne Pax RN Signed: 07/13/2023 7:57:50 AM By: Allen Derry PA-C Entered By: Yevonne Pax on 07/11/2023 15:57:53 -------------------------------------------------------------------------------- HPI Details Patient Name: Date of Service: [name] [facility] H. 07/11/2023 3:15 PM Medical Record Number: [id number] Patient Account Number: [id number] Date of Birth/Sex: Treating RN: 04/22/1951 (72 y.o. [name] Primary Care Provider: Kerby Nora Other Clinician: Referring Provider: Treating Provider/Extender: Allen Derry  Bedsole, [name] in Treatment: 23 History of Present Illness HPI Description: 01-31-2023 upon evaluation today patient appears to be doing poorly currently in regard to wounds on his right heel, right dorsal foot, and right lateral foot. Subsequently he does have known peripheral vascular disease and again this is something that he needs to I think Checked formally. This is what the majority of the conversation today hinged around and the patient voiced understanding as far as that is concerned. Fortunately I do not see any signs of active infection locally nor systemically which is great news. Patient does have a history of diabetes mellitus type 2, atrial fibrillation for which she is on long-term anticoagulant Therapy, hypertension, and coronary artery disease. Patient's hemoglobin A1c most recently was on 11-17-2022 and was 7.2 and currently he is on Eliquis and Plavix. 02-07-2023 upon evaluation today patient appears to be doing well currently in regard to his wounds all things considered I feel like we are still maintaining that does not seem like it is any worse is also not  significantly better. I discussed with the patient that I do believe he would benefit from the arterial evaluation we still need to get this done as quickly as possible and subsequently we did put in a follow-up call with the vascular office today with regard to this. 02-14-2023 upon evaluation today patient appears to be doing a little better in regard to his wounds which are showing signs of loosening which is great news. With that being said he is experiencing an improvement overall in his symptoms and we are still waiting on the vascular evaluation want to get this result and consider whether or not his blood flow is good or not we will be able to make a better determination of next steps. For now we will try and avoid any aggressive sharp debridement to know that he has good arterial flow. 02-21-2023 upon evaluation today patient appears to be doing about the same in regard to his wound. Fortunately there does not appear to be any signs of active infection at this time which is great news. No fevers, chills, nausea, vomiting, or diarrhea. I did review patient's arterial study and it appears that he has pretty good flow in the left is not perfect but it is decent. On the right however he is definitely not doing nearly as good and I think that he is going to need to see one of the vascular doctors for further evaluation and treatment of this right leg in order to get these wounds to heal. 02-28-2023 upon evaluation today patient appears to be doing well currently in regard to his wound. He has been tolerating the dressing changes without complication. Fortunately I do not see any evidence of active infection locally nor systemically at this time. I do think that the eschar started to soften up and I would like to try to get some of this off to see if we can get things moving in the right direction. He is in agreement with this plan. 03-09-2023 upon evaluation today patient appears to be doing well  currently in regard to his wound. He has been tolerating the dressing changes without complication. Fortunately there does not appear to be any signs of active infection locally nor systemically at this time which is great news. I do believe clearing out some of the necrotic debris last week has helped to a degree. 03-16-2023 upon evaluation today patient appears to be doing well currently in regard to his  wounds. He is going to be having a vascular angiogram in order to open up blood flow and I think this is going to be very beneficial for him. Fortunately I do not see any evidence of active infection locally nor systemically which is great news. 7/25; patient with predominantly ischemic wounds to on his dorsal foot and 1 on the right heel. He is going for his angiogram on Monday. He has been using the Iodoflex to the wound and changing this dressing himself. 04-11-2023 upon evaluation today patient appears to be doing well currently in regard to his wounds. In fact he had the arterial procedure with vascular and the great news is he actually has much improved blood flow he had a 95% blockage of the popliteal as well as the posterior tibial based on the record review. Post procedure he now has a residual of only about 30% in the popliteal and closer around 10% in the posterior tibial. The blood flow is greatly improved and it is obvious based on what we are seeing. 04-20-2023 upon evaluation today patient appears to be doing well currently in regard to his wounds. They are actually showing signs of excellent improvement and to be honest now that he is got good arterial flow going he is really healing quite nicely. Fortunately I do not see any signs of active infection locally or systemically which is great news. 8/29 this is a patient who has known arterial insufficiency. She has 2 wounds on the dorsal right foot and 1 on the back of the heel. She had an angiogram at the end of July and had follow-up  arterial studies yesterday but I cannot pull up the arterial studies. We are using Prisma and Zituvimet and really doing quite well. The wounds are better JUANDIEGO, ARNTZEN ([id number]) [id number] Page 4 of 10 05-08-2023 upon evaluation today patient appears to be doing well currently in regard to his ulcers. He has been tolerating the dressing changes without complication. Fortunately there does not appear to be any signs of active infection looking or systemically at this time. 05-29-2023 upon evaluation today patient appears to be doing well currently in regard to his wounds are all pretty healthy appearing. With that being said unfortunately he is having some issues here with to the wounds that were previously pretty much closed having reopened. Either way I think that the wounds are healthy appearing. 06-06-2023 upon evaluation today patient's wounds appear to be doing well though the areas actually appear to be a little bit irritated due to what appears to be drainage from the Band-Aids. I think we need to go back to doing the Zetuvit and wrapping as opposed to using the Band-Aids. 06-13-2023 upon evaluation today patient appears to be doing excellent in regard to all 3 wound locations. He has been tolerating the dressing changes without complication and in general I believe that he is making excellent headway towards closure which is great news. No fevers, chills, nausea, vomiting, or diarrhea. 06-22-2023 upon evaluation today patient actually appears to be making good headway towards closure I am actually very pleased with where we stand today. I do not see any signs of active infection locally or systemically which is great news and in general I do think that we are making good headway towards complete closure which is great news. 06-30-2023 upon evaluation today patient appears to be doing well currently in regard to his wounds they are not getting quite a small sound  like to see they  are getting a little smaller here and there at set for the 1 on the distal foot right side which actually showing signs of plateauing. Working to see how things continue to move but if it does not significantly improved by next week I may consider looking into Apligraf for him as a possibility. The patient is in agreement with that plan. 07-11-2023 upon evaluation today patient appears to be doing okay currently in regard to his wounds although the wound on the top of his foot approximately is actually larger I think is because is rubbing on his shoe. I believe that he may do better to switch out for different shoes. I recommended that tennis shoes in particular would probably be the best way to go. Electronic Signature(s) Signed: 07/11/2023 4:05:42 PM By: Allen Derry PA-C Entered By: Allen Derry on 07/11/2023 16:05:42 -------------------------------------------------------------------------------- Physical Exam Details Patient Name: Date of Service: [name] [facility] H. 07/11/2023 3:15 PM Medical Record Number: [id number] Patient Account Number: [id number] Date of Birth/Sex: Treating RN: May 05, 1951 (72 y.o. [name]) Yevonne Pax Primary Care Provider: Kerby Nora Other Clinician: Referring Provider: Treating Provider/Extender: [name], [name] in Treatment: 50 Constitutional Well-nourished and well-hydrated in no acute distress. Respiratory normal breathing without difficulty. Psychiatric this patient is able to make decisions and demonstrates good insight into disease process. Alert and Oriented x 3. pleasant and cooperative. Notes Upon inspection patient's wound bed actually showed signs of good granulation For the Most Part There Was Some Slough and Biofilm That I Did Clear Way down to Good Subcutaneous Tissue. The Patient T olerated This Today without Complication and Postdebridement Wound Bed Appears to Be Doing Much Better Which Is [employer] Electronic  Signature(s) Signed: 07/11/2023 4:06:09 PM By: Allen Derry PA-C Entered By: Allen Derry on 07/11/2023 16:06:08 [name] ([id number]) [id number] Page 5 of 10 -------------------------------------------------------------------------------- Physician Orders Details Patient Name: Date of Service: [name] [name] H. 07/11/2023 3:15 PM Medical Record Number: [id number] Patient Account Number: [id number] Date of Birth/Sex: Treating RN: 23-Feb-1951 (72 y.o. [name]) Yevonne Pax Primary Care Provider: Kerby Nora Other Clinician: Referring Provider: Treating Provider/Extender: [name], [name] in Treatment: 23 The following information was scribed by: Yevonne Pax The information was scribed for: Allen Derry Verbal / Phone Orders: No Diagnosis Coding ICD-10 Coding Code Description E11.621 Type 2 diabetes mellitus with foot ulcer L97.512 Non-pressure chronic ulcer of other part of right foot with fat layer exposed I25.10 Atherosclerotic heart disease of native coronary artery without angina pectoris I48.0 Paroxysmal atrial fibrillation Z79.01 Long term (current) use of anticoagulants I10 Essential (primary) hypertension Follow-up Appointments Return Appointment in 1 week. Bathing/ [employer] wounds with antibacterial soap and water. Anesthetic (Use 'Patient Medications' Section for Anesthetic Order Entry) Lidocaine applied to wound bed Edema Control - Orders / Instructions Elevate, Exercise Daily and A void Standing for Long Periods of Time. Elevate legs to the level of the heart and pump ankles as often as possible Elevate leg(s) parallel to the floor when sitting. Wound Treatment Wound #1R - Calcaneus Wound Laterality: Right Cleanser: Byram Ancillary Kit - 15 Day Supply (Generic) Every Other Day/30 Days Discharge Instructions: Use supplies as instructed; Kit contains: (15) Saline Bullets; (15) 3x3 Gauze; 15 pr Gloves Cleanser: Soap  and Water Every Other Day/30 Days Discharge Instructions: Gently cleanse wound with antibacterial soap, rinse and pat dry prior to dressing wounds Prim Dressing: Hydrofera Blue Ready Transfer Foam, 2.5x2.5 (in/in) Every Other Day/30 Days ary Discharge Instructions: Apply Hydrofera  Blue Ready to wound bed as directed Secondary Dressing: (BORDER) Zetuvit Plus SILICONE BORDER Dressing 4x4 (in/in) (Generic) Every Other Day/30 Days Discharge Instructions: Please do not put silicone bordered dressings under wraps. Use non-bordered dressing only. Wound #2R - Foot Wound Laterality: Dorsal, Right, Distal Cleanser: Byram Ancillary Kit - 15 Day Supply (Generic) Every Other Day/30 Days Discharge Instructions: Use supplies as instructed; Kit contains: (15) Saline Bullets; (15) 3x3 Gauze; 15 pr Gloves Cleanser: Soap and Water Every Other Day/30 Days Discharge Instructions: Gently cleanse wound with antibacterial soap, rinse and pat dry prior to dressing wounds Prim Dressing: Hydrofera Blue Ready Transfer Foam, 2.5x2.5 (in/in) Every Other Day/30 Days ary Discharge Instructions: Apply Hydrofera Blue Ready to wound bed as directed Secondary Dressing: (BORDER) Zetuvit Plus SILICONE BORDER Dressing 4x4 (in/in) (Generic) Every Other Day/30 Days ARASH, HEAP ([id number]) [id number] Page 6 of 10 Discharge Instructions: Please do not put silicone bordered dressings under wraps. Use non-bordered dressing only. Wound #3 - Foot Wound Laterality: Dorsal, Right, Proximal Cleanser: Byram Ancillary Kit - 15 Day Supply (Generic) Every Other Day/30 Days Discharge Instructions: Use supplies as instructed; Kit contains: (15) Saline Bullets; (15) 3x3 Gauze; 15 pr Gloves Cleanser: Soap and Water Every Other Day/30 Days Discharge Instructions: Gently cleanse wound with antibacterial soap, rinse and pat dry prior to dressing wounds Prim Dressing: Hydrofera Blue Ready Transfer Foam, 2.5x2.5 (in/in)  Every Other Day/30 Days ary Discharge Instructions: Apply Hydrofera Blue Ready to wound bed as directed Secondary Dressing: (BORDER) Zetuvit Plus SILICONE BORDER Dressing 4x4 (in/in) (Generic) Every Other Day/30 Days Discharge Instructions: Please do not put silicone bordered dressings under wraps. Use non-bordered dressing only. Electronic Signature(s) Signed: 07/12/2023 4:42:48 PM By: Yevonne Pax RN Signed: 07/13/2023 7:57:50 AM By: Allen Derry PA-C Entered By: Yevonne Pax on 07/11/2023 15:58:31 -------------------------------------------------------------------------------- Problem List Details Patient Name: Date of Service: [name] HN H. 07/11/2023 3:15 PM Medical Record Number: [id number] Patient Account Number: [id number] Date of Birth/Sex: Treating RN: 04/03/1951 (72 y.o. [name]) Yevonne Pax Primary Care Provider: Kerby Nora Other Clinician: Referring Provider: Treating Provider/Extender: [name], [name] in Treatment: 23 Active Problems ICD-10 Encounter Code Description Active Date MDM Diagnosis E11.621 Type 2 diabetes mellitus with foot ulcer 01/31/2023 No Yes L97.512 Non-pressure chronic ulcer of other part of right foot with fat layer exposed 01/31/2023 No Yes I25.10 Atherosclerotic heart disease of native coronary artery without angina pectoris 01/31/2023 No Yes I48.0 Paroxysmal atrial fibrillation 01/31/2023 No Yes Z79.01 Long term (current) use of anticoagulants 01/31/2023 No Yes I10 Essential (primary) hypertension 01/31/2023 No Yes NELSE, [place] ([id number]) [id number] Page 7 of 10 Inactive Problems Resolved Problems Electronic Signature(s) Signed: 07/11/2023 3:51:17 PM By: Allen Derry PA-C Entered By: Allen Derry on 07/11/2023 15:51:16 -------------------------------------------------------------------------------- Progress Note Details Patient Name: Date of Service: [name] HN H. 07/11/2023 3:15 PM Medical Record Number:  [id number] Patient Account Number: [id number] Date of Birth/Sex: Treating RN: 1950-09-25 (72 y.o. [name]) Yevonne Pax Primary Care Provider: Kerby Nora Other Clinician: Referring Provider: Treating Provider/Extender: [name], [name] in Treatment: 23 Subjective Chief Complaint Information obtained from Patient Right foot ulcers History of Present Illness (HPI) 01-31-2023 upon evaluation today patient appears to be doing poorly currently in regard to wounds on his right heel, right dorsal foot, and right lateral foot. Subsequently he does have known peripheral vascular disease and again this is something that he needs to I think Checked formally. This is what the majority of the conversation today hinged around and the  patient voiced understanding as far as that is concerned. Fortunately I do not see any signs of active infection locally nor systemically which is great news. Patient does have a history of diabetes mellitus type 2, atrial fibrillation for which she is on long-term anticoagulant Therapy, hypertension, and coronary artery disease. Patient's hemoglobin A1c most recently was on 11-17-2022 and was 7.2 and currently he is on Eliquis and Plavix. 02-07-2023 upon evaluation today patient appears to be doing well currently in regard to his wounds all things considered I feel like we are still maintaining that does not seem like it is any worse is also not significantly better. I discussed with the patient that I do believe he would benefit from the arterial evaluation we still need to get this done as quickly as possible and subsequently we did put in a follow-up call with the vascular office today with regard to this. 02-14-2023 upon evaluation today patient appears to be doing a little better in regard to his wounds which are showing signs of loosening which is great news. With that being said he is experiencing an improvement overall in his symptoms and we are still waiting on the  vascular evaluation want to get this result and consider whether or not his blood flow is good or not we will be able to make a better determination of next steps. For now we will try and avoid any aggressive sharp debridement to know that he has good arterial flow. 02-21-2023 upon evaluation today patient appears to be doing about the same in regard to his wound. Fortunately there does not appear to be any signs of active infection at this time which is great news. No fevers, chills, nausea, vomiting, or diarrhea. I did review patient's arterial study and it appears that he has pretty good flow in the left is not perfect but it is decent. On the right however he is definitely not doing nearly as good and I think that he is going to need to see one of the vascular doctors for further evaluation and treatment of this right leg in order to get these wounds to heal. 02-28-2023 upon evaluation today patient appears to be doing well currently in regard to his wound. He has been tolerating the dressing changes without complication. Fortunately I do not see any evidence of active infection locally nor systemically at this time. I do think that the eschar started to soften up and I would like to try to get some of this off to see if we can get things moving in the right direction. He is in agreement with this plan. 03-09-2023 upon evaluation today patient appears to be doing well currently in regard to his wound. He has been tolerating the dressing changes without complication. Fortunately there does not appear to be any signs of active infection locally nor systemically at this time which is great news. I do believe clearing out some of the necrotic debris last week has helped to a degree. 03-16-2023 upon evaluation today patient appears to be doing well currently in regard to his wounds. He is going to be having a vascular angiogram in order to open up blood flow and I think this is going to be very beneficial  for him. Fortunately I do not see any evidence of active infection locally nor systemically which is great news. 7/25; patient with predominantly ischemic wounds to on his dorsal foot and 1 on the right heel. He is going for his angiogram on Monday. He  has been using the Iodoflex to the wound and changing this dressing himself. 04-11-2023 upon evaluation today patient appears to be doing well currently in regard to his wounds. In fact he had the arterial procedure with vascular and the great news is he actually has much improved blood flow he had a 95% blockage of the popliteal as well as the posterior tibial based on the record review. Post procedure he now has a residual of only about 30% in the popliteal and closer around 10% in the posterior tibial. The blood flow is greatly improved and it is obvious based on what we are seeing. STPEHEN, TROJANOWSKI ([id number]) [id number] Page 8 of 10 04-20-2023 upon evaluation today patient appears to be doing well currently in regard to his wounds. They are actually showing signs of excellent improvement and to be honest now that he is got good arterial flow going he is really healing quite nicely. Fortunately I do not see any signs of active infection locally or systemically which is great news. 8/29 this is a patient who has known arterial insufficiency. She has 2 wounds on the dorsal right foot and 1 on the back of the heel. She had an angiogram at the end of July and had follow-up arterial studies yesterday but I cannot pull up the arterial studies. We are using Prisma and Zituvimet and really doing quite well. The wounds are better 05-08-2023 upon evaluation today patient appears to be doing well currently in regard to his ulcers. He has been tolerating the dressing changes without complication. Fortunately there does not appear to be any signs of active infection looking or systemically at this time. 05-29-2023 upon evaluation today  patient appears to be doing well currently in regard to his wounds are all pretty healthy appearing. With that being said unfortunately he is having some issues here with to the wounds that were previously pretty much closed having reopened. Either way I think that the wounds are healthy appearing. 06-06-2023 upon evaluation today patient's wounds appear to be doing well though the areas actually appear to be a little bit irritated due to what appears to be drainage from the Band-Aids. I think we need to go back to doing the Zetuvit and wrapping as opposed to using the Band-Aids. 06-13-2023 upon evaluation today patient appears to be doing excellent in regard to all 3 wound locations. He has been tolerating the dressing changes without complication and in general I believe that he is making excellent headway towards closure which is great news. No fevers, chills, nausea, vomiting, or diarrhea. 06-22-2023 upon evaluation today patient actually appears to be making good headway towards closure I am actually very pleased with where we stand today. I do not see any signs of active infection locally or systemically which is great news and in general I do think that we are making good headway towards complete closure which is great news. 06-30-2023 upon evaluation today patient appears to be doing well currently in regard to his wounds they are not getting quite a small sound like to see they are getting a little smaller here and there at set for the 1 on the distal foot right side which actually showing signs of plateauing. Working to see how things continue to move but if it does not significantly improved by next week I may consider looking into Apligraf for him as a possibility. The patient is in agreement with that plan. 07-11-2023 upon evaluation today patient appears to be doing okay currently  in regard to his wounds although the wound on the top of his foot approximately is actually larger I think is  because is rubbing on his shoe. I believe that he may do better to switch out for different shoes. I recommended that tennis shoes in particular would probably be the best way to go. Objective Constitutional Well-nourished and well-hydrated in no acute distress. Vitals Time Taken: 3:31 PM, Height: 66 in, Weight: 250 lbs, BMI: 40.3, Temperature: 97.6 F, Pulse: 78 bpm, Respiratory Rate: 18 breaths/min, Blood Pressure: 136/59 mmHg. Respiratory normal breathing without difficulty. Psychiatric this patient is able to make decisions and demonstrates good insight into disease process. Alert and Oriented x 3. pleasant and cooperative. General Notes: Upon inspection patient's wound bed actually showed signs of good granulation For the Most Part There Was Some Slough and Biofilm That I Did Clear Way down to Good Subcutaneous Tissue. The Patient T olerated This Today without Complication and Postdebridement Wound Bed Appears to Be Doing Much Better Which Is [employer] Integumentary (Hair, Skin) Wound #1R status is Open. Original cause of wound was Gradually Appeared. The date acquired was: 12/10/2022. The wound has been in treatment 23 weeks. The wound is located on the Right Calcaneus. The wound measures 0.5cm length x 0.4cm width x 0.1cm depth; 0.157cm^2 area and 0.016cm^3 volume. There is Fat Layer (Subcutaneous Tissue) exposed. There is no tunneling or undermining noted. There is a medium amount of serosanguineous drainage noted. There is large (67-100%) red granulation within the wound bed. There is no necrotic tissue within the wound bed. Wound #2R status is Open. Original cause of wound was Gradually Appeared. The date acquired was: 12/10/2022. The wound has been in treatment 23 weeks. The wound is located on the Right,Distal,Dorsal Foot. The wound measures 1.2cm length x 1.4cm width x 0.2cm depth; 1.319cm^2 area and 0.264cm^3 volume. There is Fat Layer (Subcutaneous Tissue) exposed. There is no  tunneling or undermining noted. There is a medium amount of serosanguineous drainage noted. There is large (67-100%) red, pink granulation within the wound bed. There is no necrotic tissue within the wound bed. Wound #3 status is Open. Original cause of wound was Gradually Appeared. The date acquired was: 12/10/2022. The wound has been in treatment 23 weeks. The wound is located on the Right,Proximal,Dorsal Foot. The wound measures 1.1cm length x 1.4cm width x 0.1cm depth; 1.21cm^2 area and 0.121cm^3 volume. There is Fat Layer (Subcutaneous Tissue) exposed. There is no tunneling or undermining noted. There is a medium amount of serosanguineous drainage noted. There is medium (34-66%) red granulation within the wound bed. There is a medium (34-66%) amount of necrotic tissue within the wound bed including Adherent Slough. 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 There was a Excisional Skin/Subcutaneous Tissue Debridement with a total area of 0.16 sq cm performed by Allen Derry, PA-C. With the following instrument(s): Curette to remove Viable tissue/material. Material removed includes Callus, Subcutaneous Tissue, and Slough. No specimens were taken. A time out was conducted at 15:50, prior to the start of the procedure.  A Minimum amount of bleeding was controlled with Pressure. The procedure was tolerated well with a pain level of 0  throughout and a pain level of 0 following the procedure. Post Debridement Measurements: 0.5cm length x 0.4cm width x 0.1cm depth; 0.016cm^3 volume. Character of Wound/Ulcer Post Debridement is improved. Severity of Tissue Post Debridement is: Fat layer exposed. Post procedure Diagnosis Wound #1R: Same as Pre-Procedure Wound #2R Pre-procedure diagnosis of Wound #2R is a Diabetic Wound/Ulcer of the Lower Extremity located on the Right,Distal,Dorsal Foot .Severity of Tissue Pre Debridement is: Fat layer exposed. There was a Excisional Skin/Subcutaneous Tissue Debridement with a total area of 1.32 sq cm performed by Allen Derry, PA-C. With the following instrument(s): Curette to remove Viable tissue/material. Material removed includes Callus, Subcutaneous Tissue, and Slough. No specimens were taken. A time out was conducted at 15:50, prior to the start of the procedure. A Minimum amount of bleeding was controlled with Pressure. The procedure was tolerated well with a pain level of 0 throughout and a pain level of 0 following the procedure. Post Debridement Measurements: 1.2cm length x 1.4cm width x 0.2cm depth; 0.264cm^3 volume. Character of Wound/Ulcer Post Debridement is improved. Severity of Tissue Post Debridement is: Fat layer exposed. Post procedure Diagnosis Wound #2R: Same as Pre-Procedure Plan Follow-up Appointments: Return Appointment in 1 week. Bathing/ Shower/ Hygiene: Wash wounds with antibacterial soap and water. Anesthetic (Use 'Patient Medications' Section for Anesthetic Order Entry): Lidocaine applied to wound bed Edema Control - Orders / Instructions: Elevate, Exercise Daily and Avoid Standing for Long Periods of Time. Elevate legs to the level of the heart and pump ankles as often as possible Elevate leg(s) parallel to the floor when sitting. WOUND #1R: - Calcaneus Wound Laterality: Right Cleanser: Byram Ancillary Kit - 15 Day Supply (Generic) Every Other Day/30  Days Discharge Instructions: Use supplies as instructed; Kit contains: (15) Saline Bullets; (15) 3x3 Gauze; 15 pr Gloves Cleanser: Soap and Water Every Other Day/30 Days Discharge Instructions: Gently cleanse wound with antibacterial soap, rinse and pat dry prior to dressing wounds Prim Dressing: Hydrofera Blue Ready Transfer Foam, 2.5x2.5 (in/in) Every Other Day/30 Days ary Discharge Instructions: Apply Hydrofera Blue Ready to wound bed as directed Secondary Dressing: (BORDER) Zetuvit Plus SILICONE BORDER Dressing 4x4 (in/in) (Generic) Every Other Day/30 Days Discharge Instructions: Please do not put silicone bordered dressings under wraps. Use non-bordered dressing only. WOUND #2R: - Foot Wound Laterality: Dorsal, Right, Distal Cleanser: [employer]ram Ancillary Kit - 15 Day Supply (Generic) Every Other Day/30 Days Discharge Instructions: Use supplies as instructed; Kit contains: (15) Saline Bullets; (15) 3x3 Gauze; 15 pr Gloves Cleanser: Soap and Water Every Other Day/30 Days Discharge Instructions: Gently cleanse wound with antibacterial soap, rinse and pat dry prior to dressing wounds Prim Dressing: Hydrofera Blue Ready Transfer Foam, 2.5x2.5 (in/in) Every Other Day/30 Days ary Discharge Instructions: Apply Hydrofera Blue Ready to wound bed as directed Secondary Dressing: (BORDER) Zetuvit Plus SILICONE BORDER Dressing 4x4 (in/in) (Generic) Every Other Day/30 Days Discharge Instructions: Please do not put silicone bordered dressings under wraps. Use non-bordered dressing only. WOUND #3: - Foot Wound Laterality: Dorsal, Right, Proximal Cleanser: [employer]ram Ancillary Kit - 15 Day Supply (Generic) Every Other Day/30 Days Discharge Instructions: Use supplies as instructed; Kit contains: (15) Saline Bullets; (15) 3x3 Gauze; 15 pr Gloves Cleanser: Soap and Water Every Other Day/30 Days Discharge Instructions: Gently cleanse wound with antibacterial soap, rinse and pat dry prior to dressing wounds Prim  Dressing: Hydrofera Blue Ready Transfer Foam, 2.5x2.5 (in/in) Every  Other Day/30 Days ary Discharge Instructions: Apply Hydrofera Blue Ready to wound bed as directed Secondary Dressing: (BORDER) Zetuvit Plus SILICONE BORDER Dressing 4x4 (in/in) (Generic) Every Other Day/30 Days Discharge Instructions: Please do not put silicone bordered dressings under wraps. Use non-bordered dressing only. 1. I am going to recommend that the patient going continue to monitor for any signs of infection or worsening. Based on what I am seeing I do believe that a switch to [facility] would likely be good for the patient. 2. I am good recommend as well that the patient should continue to use the bordered foam dressing to cover. 3. I am going to recommend that he should switch away from crocs and go to using a tennis shoe which I think is gena do much better for him. We will see patient back for reevaluation in 1 week here in the clinic. If anything worsens or changes patient will contact our office for additional recommendations. KAMAHAO, STEFFENSMEIER ([id number]) [id number] Page 10 of 10 Electronic Signature(s) Signed: 07/11/2023 4:06:47 PM By: Allen Derry PA-C Entered By: Allen Derry on 07/11/2023 16:06:46 -------------------------------------------------------------------------------- SuperBill Details Patient Name: Date of Service: [name] HN H. 07/11/2023 Medical Record Number: [id number] Patient Account Number: [id number] Date of Birth/Sex: Treating RN: 1950/12/10 (72 y.o. [name]) Yevonne Pax Primary Care Provider: Kerby Nora Other Clinician: Referring Provider: Treating Provider/Extender: [name], [name] in Treatment: 23 Diagnosis Coding ICD-10 Codes Code Description E11.621 Type 2 diabetes mellitus with foot ulcer L97.512 Non-pressure chronic ulcer of other part of right foot with fat layer exposed I25.10 Atherosclerotic heart disease of native coronary  artery without angina pectoris I48.0 Paroxysmal atrial fibrillation Z79.01 Long term (current) use of anticoagulants I10 Essential (primary) hypertension Facility Procedures : CPT4 Code: [id number] Description: 11042 - DEB SUBQ TISSUE 20 SQ CM/< ICD-10 Diagnosis Description L97.512 Non-pressure chronic ulcer of other part of right foot with fat layer exposed Modifier: Quantity: 1 Physician Procedures : CPT4 Code Description Modifier [id number] 11042 - WC PHYS SUBQ TISS 20 SQ CM ICD-10 Diagnosis Description L97.512 Non-pressure chronic ulcer of other part of right foot with fat layer exposed Quantity: 1 Electronic Signature(s) Signed: 07/11/2023 4:08:03 PM By: Allen Derry PA-C Entered By: Allen Derry on 07/11/2023 16:08:02

## 2023-07-12 DIAGNOSIS — H34813 Central retinal vein occlusion, bilateral, with macular edema: Secondary | ICD-10-CM | POA: Diagnosis not present

## 2023-07-12 DIAGNOSIS — E113393 Type 2 diabetes mellitus with moderate nonproliferative diabetic retinopathy without macular edema, bilateral: Secondary | ICD-10-CM | POA: Diagnosis not present

## 2023-07-12 DIAGNOSIS — H3563 Retinal hemorrhage, bilateral: Secondary | ICD-10-CM | POA: Diagnosis not present

## 2023-07-12 DIAGNOSIS — H3582 Retinal ischemia: Secondary | ICD-10-CM | POA: Diagnosis not present

## 2023-07-12 DIAGNOSIS — H35372 Puckering of macula, left eye: Secondary | ICD-10-CM | POA: Diagnosis not present

## 2023-07-12 NOTE — Progress Notes (Signed)
[name] ([id number]) [id number] Page 1 of 11 Visit Report for 07/11/2023 Arrival Information Details Patient Name: Date of Service: [name] [facility] H. 07/11/2023 3:15 PM Medical Record Number: [id number] Patient Account Number: [id number] Date of Birth/Sex: Treating RN: 12-07-1950 (72 y.o. [name]) Yevonne Pax Primary Care Felisa Zechman: Kerby Nora Other Clinician: Referring Hazelene Doten: Treating Kerim Statzer/Extender: [name], [name] in Treatment: 23 Visit Information History Since Last Visit Added or deleted any medications: No Patient Arrived: Ambulatory Any new allergies or adverse reactions: No Arrival Time: 15:30 Had a fall or experienced change in No Accompanied By: self activities of daily living that may affect Transfer Assistance: None risk of falls: Patient Identification Verified: Yes Signs or symptoms of abuse/neglect since last visito No Secondary Verification Process Completed: Yes Hospitalized since last visit: No Patient Requires Transmission-Based Precautions: No Implantable device outside of the clinic excluding No Patient Has Alerts: Yes cellular tissue based products placed in the center Patient Alerts: Patient on Blood Thinner since last visit: Diabetes type 2 Has Dressing in Place as Prescribed: Yes Eliquis/Plavix Pain Present Now: No ABI R 1.23 TBI 0.57 ABI L 0.98 TBI 0.80 Electronic Signature(s) Signed: 07/12/2023 4:42:48 PM By: Yevonne Pax RN Entered By: Yevonne Pax on 07/11/2023 12:30:36 -------------------------------------------------------------------------------- Clinic Level of Care Assessment Details Patient Name: Date of Service: [name] [facility] H. 07/11/2023 3:15 PM Medical Record Number: [id number] Patient Account Number: [id number] Date of Birth/Sex: Treating RN: [birth year] (72 y.o. [name]) Yevonne Pax Primary Care Saahas Hidrogo: Kerby Nora Other Clinician: Referring Vedansh Kerstetter: Treating Fredricka Kohrs/Extender:  [name], [name] in Treatment: 23 Clinic Level of Care Assessment Items TOOL 1 Quantity Score []  - 0 Use when EandM and Procedure is performed on INITIAL visit ASSESSMENTS - Nursing Assessment / Reassessment []  - 0 General Physical Exam (combine w/ comprehensive assessment (listed just below) when performed on new pt. evals) []  - 0 Comprehensive Assessment (HX, ROS, Risk Assessments, Wounds Hx, etc.) [name], [name] ([id number]) [id number] Page 2 of 11 ASSESSMENTS - Wound and Skin Assessment / Reassessment []  - 0 Dermatologic / Skin Assessment (not related to wound area) ASSESSMENTS - Ostomy and/or Continence Assessment and Care []  - 0 Incontinence Assessment and Management []  - 0 Ostomy Care Assessment and Management (repouching, etc.) PROCESS - Coordination of Care []  - 0 Simple Patient / Family Education for ongoing care []  - 0 Complex (extensive) Patient / Family Education for ongoing care []  - 0 Staff obtains Chiropractor, Records, T Results / Process Orders est []  - 0 Staff telephones HHA, Nursing Homes / Clarify orders / etc []  - 0 Routine Transfer to another Facility (non-emergent condition) []  - 0 Routine Hospital Admission (non-emergent condition) []  - 0 New Admissions / Manufacturing engineer / Ordering NPWT Apligraf, etc. , []  - 0 Emergency Hospital Admission (emergent condition) PROCESS - Special Needs []  - 0 Pediatric / Minor Patient Management []  - 0 Isolation Patient Management []  - 0 Hearing / Language / Visual special needs []  - 0 Assessment of Community assistance (transportation, D/C planning, etc.) []  - 0 Additional assistance / Altered mentation []  - 0 Support Surface(s) Assessment (bed, cushion, seat, etc.) INTERVENTIONS - Miscellaneous []  - 0 External ear exam []  - 0 Patient Transfer (multiple staff / Nurse, adult / Similar devices) []  - 0 Simple Staple / Suture removal (25 or less) []  - 0 Complex  Staple / Suture removal (26 or more) []  - 0 Hypo/Hyperglycemic Management (do not check if billed separately) []  - 0 Ankle /  Brachial Index (ABI) - do not check if billed separately Has the patient been seen at the hospital within the last three years: Yes Total Score: 0 Level Of Care: ____ Electronic Signature(s) Signed: 07/12/2023 4:42:48 PM By: Yevonne Pax RN Entered By: Yevonne Pax on 07/11/2023 12:58:38 -------------------------------------------------------------------------------- Encounter Discharge Information Details Patient Name: Date of Service: [name] [facility] H. 07/11/2023 3:15 PM Medical Record Number: [id number] Patient Account Number: [id number] Date of Birth/Sex: Treating RN: Feb 01, 1951 (72 y.o. [name] Primary Care Sheray Grist: Kerby Nora Other Clinician: Referring Masami Plata: Treating Henya Aguallo/Extender: [name], [name] in Treatment: [address], [name] ([id number]) [id number] Page 3 of 11 Encounter Discharge Information Items Post Procedure Vitals Discharge Condition: Stable Temperature (F): 97.6 Ambulatory Status: Ambulatory Pulse (bpm): 78 Discharge Destination: Home Respiratory Rate (breaths/min): 18 Transportation: Private Auto Blood Pressure (mmHg): 136/59 Accompanied By: self Schedule Follow-up Appointment: Yes Clinical Summary of Care: Electronic Signature(s) Signed: 07/12/2023 4:42:48 PM By: Yevonne Pax RN Entered By: Yevonne Pax on 07/11/2023 13:00:50 -------------------------------------------------------------------------------- Lower Extremity Assessment Details Patient Name: Date of Service: [name] [facility] H. 07/11/2023 3:15 PM Medical Record Number: [id number] Patient Account Number: [id number] Date of Birth/Sex: Treating RN: Mar 10, 1951 (72 y.o. [name]) Yevonne Pax Primary Care Kazzandra Desaulniers: Kerby Nora Other Clinician: Referring Eligah Anello: Treating Cecil Vandyke/Extender: [name], [name]  in Treatment: 23 Edema Assessment Assessed: [Left: No] [Right: No] Edema: [Left: Ye] [Right: s] Vascular Assessment Pulses: Dorsalis Pedis Palpable: [Right:Yes] Extremity colors, hair growth, and conditions: Extremity Color: [Right:Hyperpigmented] Hair Growth on Extremity: [Right:No] Temperature of Extremity: [Right:Warm] Capillary Refill: [Right:< 3 seconds] Dependent Rubor: [Right:No] Blanched when Elevated: [Right:No No] Toe Nail Assessment Left: Right: Thick: Yes Discolored: Yes Deformed: Yes Improper Length and Hygiene: Yes Electronic Signature(s) Signed: 07/12/2023 4:42:48 PM By: Yevonne Pax RN Entered By: Yevonne Pax on 07/11/2023 12:38:04 [name] ([id number]) [id number] Page 4 of 11 -------------------------------------------------------------------------------- Multi Wound Chart Details Patient Name: Date of Service: [name] [facility] H. 07/11/2023 3:15 PM Medical Record Number: [id number] Patient Account Number: [id number] Date of Birth/Sex: Treating RN: July 14, 1951 (72 y.o. [name]) Yevonne Pax Primary Care Ketara Cavness: Kerby Nora Other Clinician: Referring Marshelle Bilger: Treating Terryn Rosenkranz/Extender: [name], [name] in Treatment: 62 Vital Signs Height(in): 66 Pulse(bpm): 78 Weight(lbs): 250 Blood Pressure(mmHg): 136/59 Body Mass Index(BMI): 40.3 Temperature(F): 97.6 Respiratory Rate(breaths/min): 18 [1R:Photos:] Right Calcaneus Right, Distal, Dorsal Foot Right, Proximal, Dorsal Foot Wound Location: Gradually Appeared Gradually Appeared Gradually Appeared Wounding Event: Diabetic Wound/Ulcer of the Lower Diabetic Wound/Ulcer of the Lower Diabetic Wound/Ulcer of the Lower Primary Etiology: Extremity Extremity Extremity Congestive Heart Failure, Coronary Congestive Heart Failure, Coronary Congestive Heart Failure, Coronary Comorbid History: Artery Disease, Hypertension, Type II Artery Disease, Hypertension, Type II Artery  Disease, Hypertension, Type II Diabetes, Neuropathy Diabetes, Neuropathy Diabetes, Neuropathy 12/10/2022 12/10/2022 12/10/2022 Date Acquired: 23 23 23  Weeks of Treatment: Open Open Open Wound Status: No No No Wound Recurrence: Yes No No Clustered Wound: 0.5x0.4x0.1 1.2x1.4x0.2 1.1x1.4x0.1 Measurements L x W x D (cm) 0.157 1.319 1.21 A (cm) : rea 0.016 0.264 0.121 Volume (cm) : 96.30% -52.70% 51.90% % Reduction in A rea: 96.30% -207.00% 51.80% % Reduction in Volume: Grade 1 Grade 1 Grade 1 Classification: Medium Medium Medium Exudate A mount: Serosanguineous Serosanguineous Serosanguineous Exudate Type: red, brown red, brown red, brown Exudate Color: Large (67-100%) Large (67-100%) Medium (34-66%) Granulation A mount: Red Red, Pink Red Granulation Quality: None Present (0%) None Present (0%) Medium (34-66%) Necrotic A mount: Fat Layer (Subcutaneous Tissue): Yes Fat Layer (Subcutaneous  Tissue): Yes Fat Layer (Subcutaneous Tissue): Yes Exposed Structures: Fascia: No Fascia: No Fascia: No Tendon: No Tendon: No Tendon: No Muscle: No Muscle: No Muscle: No Joint: No Joint: No Joint: No Bone: No Bone: No Bone: No Large (67-100%) Large (67-100%) Small (1-33%) Epithelialization: Treatment Notes Electronic Signature(s) Signed: 07/12/2023 4:42:48 PM By: Yevonne Pax RN Entered By: Yevonne Pax on 07/11/2023 12:38:12 [name] ([id number]) [id number] Page 5 of 11 -------------------------------------------------------------------------------- Multi-Disciplinary Care Plan Details Patient Name: Date of Service: [name] [facility] H. 07/11/2023 3:15 PM Medical Record Number: [id number] Patient Account Number: [id number] Date of Birth/Sex: Treating RN: 06-Apr-1951 (72 y.o. [name]) Yevonne Pax Primary Care Deshaun Weisinger: Kerby Nora Other Clinician: Referring Yeng Frankie: Treating Albaro Deviney/Extender: [name], [name] in Treatment:  33 Active Inactive Electronic Signature(s) Signed: 07/12/2023 4:42:48 PM By: Yevonne Pax RN Entered By: Yevonne Pax on 07/11/2023 12:38:46 -------------------------------------------------------------------------------- Pain Assessment Details Patient Name: Date of Service: [name] [facility] H. 07/11/2023 3:15 PM Medical Record Number: [id number] Patient Account Number: [id number] Date of Birth/Sex: Treating RN: 12-30-1950 (72 y.o. [name] Primary Care Avrie Kedzierski: Kerby Nora Other Clinician: Referring Puneet Selden: Treating Eriona Kinchen/Extender: [name], [name] in Treatment: 23 Active Problems Location of Pain Severity and Description of Pain Patient Has Paino No Site Locations [name], [name] H ([id number]) [id number] Page 6 of 11 Pain Management and Medication Current Pain Management: Electronic Signature(s) Signed: 07/12/2023 4:42:48 PM By: Yevonne Pax RN Entered By: Yevonne Pax on 07/11/2023 12:32:02 -------------------------------------------------------------------------------- Patient/Caregiver Education Details Patient Name: Date of Service: [name] 11/12/2024andnbsp3:15 PM Medical Record Number: [id number] Patient Account Number: [id number] Date of Birth/Gender: Treating RN: 1950/11/22 (72 y.o. [name]) Yevonne Pax Primary Care Physician: Kerby Nora Other Clinician: Referring Physician: Treating Physician/Extender: [name], [name] in Treatment: 66 Education Assessment Education Provided To: Patient Education Topics Provided Wound/Skin Impairment: Handouts: Caring for Your Ulcer Methods: Printed Responses: State content correctly Electronic Signature(s) Signed: 07/12/2023 4:42:48 PM By: Yevonne Pax RN Entered By: Yevonne Pax on 07/11/2023 12:39:03 -------------------------------------------------------------------------------- Wound Assessment Details Patient Name: Date of Service: [name] [facility]  H. 07/11/2023 3:15 PM Medical Record Number: [id number] Patient Account Number: [id number] Date of Birth/Sex: Treating RN: [birth year] (72 y.o. [name] Primary Care Darryle Dennie: Kerby Nora Other Clinician: Referring Cherry Wittwer: Treating Josceline Chenard/Extender: [name], [name] in Treatment: 23 Wound Status Wound Number: 1R Primary Diabetic Wound/Ulcer of the Lower Extremity REUVEN, HILDEBRAND ([id number]) [id number] Page 7 of 11 Etiology: Wound Location: Right Calcaneus Wound Open Wounding Event: Gradually Appeared Status: Date Acquired: 12/10/2022 Comorbid Congestive Heart Failure, Coronary Artery Disease, Weeks Of Treatment: 23 History: Hypertension, Type II Diabetes, Neuropathy Clustered Wound: Yes Photos Wound Measurements Length: (cm) 0.5 Width: (cm) 0.4 Depth: (cm) 0.1 Area: (cm) 0.157 Volume: (cm) 0.016 % Reduction in Area: 96.3% % Reduction in Volume: 96.3% Epithelialization: Large (67-100%) Tunneling: No Undermining: No Wound Description Classification: Grade 1 Exudate Amount: Medium Exudate Type: Serosanguineous Exudate Color: red, brown Foul Odor After Cleansing: No Slough/Fibrino Yes Wound Bed Granulation Amount: Large (67-100%) Exposed Structure Granulation Quality: Red Fascia Exposed: No Necrotic Amount: None Present (0%) Fat Layer (Subcutaneous Tissue) Exposed: Yes Tendon Exposed: No Muscle Exposed: No Joint Exposed: No Bone Exposed: No Treatment Notes Wound #1R (Calcaneus) Wound Laterality: Right Cleanser Byram Ancillary Kit - 15 Day Supply Discharge Instruction: Use supplies as instructed; Kit contains: (15) Saline Bullets; (15) 3x3 Gauze; 15 pr Gloves Soap and Water Discharge Instruction: Gently cleanse wound with antibacterial soap, rinse and pat  dry prior to dressing wounds Peri-Wound Care Topical Primary Dressing Hydrofera Blue Ready Transfer Foam, 2.5x2.5 (in/in) Discharge Instruction: Apply Hydrofera  Blue Ready to wound bed as directed Secondary Dressing (BORDER) Zetuvit Plus SILICONE BORDER Dressing 4x4 (in/in) Discharge Instruction: Please do not put silicone bordered dressings under wraps. Use non-bordered dressing only. Secured With Compression Wrap Compression Stockings Facilities manager) Signed: 07/12/2023 4:42:48 PM By: Yevonne Pax RN [name], [name] ([id number]) [id number] Page 8 of 11 Entered By: Yevonne Pax on 07/11/2023 12:36:49 -------------------------------------------------------------------------------- Wound Assessment Details Patient Name: Date of Service: [name] [facility] H. 07/11/2023 3:15 PM Medical Record Number: [id number] Patient Account Number: [id number] Date of Birth/Sex: Treating RN: 1951/07/05 (72 y.o. [name]) Yevonne Pax Primary Care Dwan Fennel: Kerby Nora Other Clinician: Referring Sawyer Mentzer: Treating Chavez Rosol/Extender: [name], [name] in Treatment: 23 Wound Status Wound Number: 2R Primary Diabetic Wound/Ulcer of the Lower Extremity Etiology: Wound Location: Right, Distal, Dorsal Foot Wound Open Wounding Event: Gradually Appeared Status: Date Acquired: 12/10/2022 Comorbid Congestive Heart Failure, Coronary Artery Disease, Weeks Of Treatment: 23 History: Hypertension, Type II Diabetes, Neuropathy Clustered Wound: No Photos Wound Measurements Length: (cm) 1.2 Width: (cm) 1.4 Depth: (cm) 0.2 Area: (cm) 1.319 Volume: (cm) 0.264 % Reduction in Area: -52.7% % Reduction in Volume: -207% Epithelialization: Large (67-100%) Tunneling: No Undermining: No Wound Description Classification: Grade 1 Exudate Amount: Medium Exudate Type: Serosanguineous Exudate Color: red, brown Foul Odor After Cleansing: No Slough/Fibrino No Wound Bed Granulation Amount: Large (67-100%) Exposed Structure Granulation Quality: Red, Pink Fascia Exposed: No Necrotic Amount: None Present (0%) Fat Layer (Subcutaneous  Tissue) Exposed: Yes Tendon Exposed: No Muscle Exposed: No Joint Exposed: No Bone Exposed: No Treatment Notes Wound #2R (Foot) Wound Laterality: Dorsal, Right, [address] [place] ([id number]) [id number] Page 9 of 11 Byram Ancillary Kit - 15 Day Supply Discharge Instruction: Use supplies as instructed; Kit contains: (15) Saline Bullets; (15) 3x3 Gauze; 15 pr Gloves Soap and Water Discharge Instruction: Gently cleanse wound with antibacterial soap, rinse and pat dry prior to dressing wounds Peri-Wound Care Topical Primary Dressing Hydrofera Blue Ready Transfer Foam, 2.5x2.5 (in/in) Discharge Instruction: Apply Hydrofera Blue Ready to wound bed as directed Secondary Dressing (BORDER) Zetuvit Plus SILICONE BORDER Dressing 4x4 (in/in) Discharge Instruction: Please do not put silicone bordered dressings under wraps. Use non-bordered dressing only. Secured With Compression Wrap Compression Stockings Facilities manager) Signed: 07/12/2023 4:42:48 PM By: Yevonne Pax RN Entered By: Yevonne Pax on 07/11/2023 12:37:15 -------------------------------------------------------------------------------- Wound Assessment Details Patient Name: Date of Service: [name] [facility] H. 07/11/2023 3:15 PM Medical Record Number: [id number] Patient Account Number: [id number] Date of Birth/Sex: Treating RN: [birth year] (72 y.o. [name]) Yevonne Pax Primary Care Jawad Wiacek: Kerby Nora Other Clinician: Referring Katherine Tout: Treating Sheriece Jefcoat/Extender: [name], [name] in Treatment: 23 Wound Status Wound Number: 3 Primary Diabetic Wound/Ulcer of the Lower Extremity Etiology: Wound Location: Right, Proximal, Dorsal Foot Wound Open Wounding Event: Gradually Appeared Status: Date Acquired: 12/10/2022 Comorbid Congestive Heart Failure, Coronary Artery Disease, Weeks Of Treatment: 23 History: Hypertension, Type II Diabetes, Neuropathy Clustered Wound:  No Photos Wound Measurements Length: (cm) 1.1 [name], [name] ([id number]) Width: (cm) 1.4 Depth: (cm) 0.1 Area: (cm) 1.21 Volume: (cm) 0.121 % Reduction in Area: 51.9% [id number] Page 10 of 11 % Reduction in Volume: 51.8% Epithelialization: Small (1-33%) Tunneling: No Undermining: No Wound Description Classification: Grade 1 Exudate Amount: Medium Exudate Type: Serosanguineous Exudate Color: red, brown Foul Odor After Cleansing: No Slough/Fibrino Yes Wound Bed Granulation Amount: Medium (34-66%) Exposed  Structure Granulation Quality: Red Fascia Exposed: No Necrotic Amount: Medium (34-66%) Fat Layer (Subcutaneous Tissue) Exposed: Yes Necrotic Quality: Adherent Slough Tendon Exposed: No Muscle Exposed: No Joint Exposed: No Bone Exposed: No Treatment Notes Wound #3 (Foot) Wound Laterality: Dorsal, Right, Proximal Cleanser Byram Ancillary Kit - 15 Day Supply Discharge Instruction: Use supplies as instructed; Kit contains: (15) Saline Bullets; (15) 3x3 Gauze; 15 pr Gloves Soap and Water Discharge Instruction: Gently cleanse wound with antibacterial soap, rinse and pat dry prior to dressing wounds Peri-Wound Care Topical Primary Dressing Hydrofera Blue Ready Transfer Foam, 2.5x2.5 (in/in) Discharge Instruction: Apply Hydrofera Blue Ready to wound bed as directed Secondary Dressing (BORDER) Zetuvit Plus SILICONE BORDER Dressing 4x4 (in/in) Discharge Instruction: Please do not put silicone bordered dressings under wraps. Use non-bordered dressing only. Secured With Compression Wrap Compression Stockings Facilities manager) Signed: 07/12/2023 4:42:48 PM By: Yevonne Pax RN Entered By: Yevonne Pax on 07/11/2023 12:37:39 -------------------------------------------------------------------------------- Vitals Details Patient Name: Date of Service: [name] [facility] H. 07/11/2023 3:15 PM Medical Record Number: [id number] Patient Account  Number: [id number] Date of Birth/Sex: Treating RN: December 13, 1950 (72 y.o. [name] Primary Care Patton Swisher: Kerby Nora Other Clinician: Referring Alexcis Bicking: Treating Ivry Pigue/Extender: [name], [name] in Treatment: [address], [name] ([id number]) [id number] Page 11 of 11 Vital Signs Time Taken: 15:31 Temperature (F): 97.6 Height (in): 66 Pulse (bpm): 78 Weight (lbs): 250 Respiratory Rate (breaths/min): 18 Body Mass Index (BMI): 40.3 Blood Pressure (mmHg): 136/59 Reference Range: 80 - 120 mg / dl Electronic Signature(s) Signed: 07/12/2023 4:42:48 PM By: Yevonne Pax RN Entered By: Yevonne Pax on 07/11/2023 12:31:51

## 2023-07-14 ENCOUNTER — Other Ambulatory Visit: Payer: Self-pay | Admitting: Family

## 2023-07-17 ENCOUNTER — Other Ambulatory Visit (HOSPITAL_COMMUNITY): Payer: Self-pay

## 2023-07-17 NOTE — Progress Notes (Unsigned)
Paramedicine Encounter    Patient ID: [name], male    DOB: [birth year], 72 y.o.   MRN: [id number]   Complaints-foot wound open-being tended to   Edema-none   Compliance with meds-yes  Pill box filled-n/a If so, by whom-n/a  Refills needed-none   Pt reports he is doing good. He had shots in his eyes last week, his rt eye is still red and blood blood vessels. He denies increased sob no dizziness, no c/p.  Weight seems stable.  Making it to his appointments fine.  His daughter is helping him with that.  He does not prefer pill box, he labels the top of his pill bottles with how many times a day he takes it.  V/s obtained, all within normal limits    CBG's  68 in AM up to 300 in evening-takes meds and it comes down  Using freestyle meter to help manage this.  He is managing all things well for himself and has all needs met.  Will d/c with Inetta Fermo ref continuing paramedicine.  D/c with pt as well and will keep him updated but he feels comfortable as well.   BP 118/80   Pulse 78   Resp 16   Wt 252 lb (114.3 kg)   SpO2 96%   BMI 40.67 kg/m  Weight yesterday--252 Last visit weight-256-heart clinic   Patient Care Team: [name], MD as PCP - General Mariah Milling, Tollie Pizza, MD as PCP - Cardiology (Cardiology) Antonieta Iba, MD as Consulting Physician (Cardiology) Kathyrn Sheriff, [facility] (Inactive) as Pharmacist (Pharmacist)  Patient Active Problem List   Diagnosis Date Noted   PAD (peripheral artery disease) (HCC) 04/11/2023   Leg weakness, bilateral 04/11/2023   SOB (shortness of breath) 03/03/2023   Atrial fibrillation (HCC) 02/22/2023   Alteration in mobility associated with pain 01/12/2023   Diabetic ulcer of ankle (HCC) 01/12/2023   Peripheral neuropathy 01/12/2023   NSTEMI (non-ST elevated myocardial infarction) (HCC) 11/05/2022   Penis disorder 05/16/2022   Other fatigue 05/16/2022   Rash 05/12/2022   GAD (generalized anxiety disorder) 09/21/2021    Encounter for power mobility device assessment 05/13/2021   Statin myopathy 03/30/2021   Chronic midline low back pain 11/12/2020   Tinea corporis 04/10/2018   ED (erectile dysfunction) 03/06/2018   Acute on chronic diastolic CHF (congestive heart failure) (HCC) 09/20/2016   Adjustment disorder with mixed anxiety and depressed mood 09/09/2016   Gastroesophageal reflux disease 09/09/2016   Elevated left ventricular end-diastolic pressure (LVEDP) 03/11/2016   Anemia 03/11/2016   Hypertensive heart disease with heart failure (HCC)    Diabetes mellitus type 2 with retinopathy (HCC)    Coronary artery disease of native artery of native heart with stable angina pectoris (HCC)    Morbid obesity (HCC)    Chronic chest pain    Diabetic retinopathy (HCC) 09/23/2014   Snoring 12/21/2012   HYPOGONADISM 09/28/2010   B12 deficiency 09/28/2010   Vitamin D deficiency 09/28/2010   OTHER MALAISE AND FATIGUE 09/17/2010   Trigger finger, acquired 09/14/2010   BRANCH RETINAL VEIN OCCLUSION 11/26/2009   Constipation 08/10/2009   Gastritis and gastroduodenitis 07/29/2009   Major depressive disorder, recurrent episode, moderate (HCC) 07/06/2007   RENAL CALCULUS, HX OF 03/26/2007   Hyperlipidemia associated with type 2 diabetes mellitus (HCC) 12/05/2006   Hypertension associated with diabetes (HCC) 12/05/2006   Osteoarthritis 12/05/2006    Current Outpatient Medications:    Acetaminophen (TYLENOL PO), Take 3-4 tablets by mouth as needed (pain)., Disp: ,  Rfl:    apixaban (ELIQUIS) 5 MG TABS tablet, Take 1 tablet (5 mg total) by mouth 2 (two) times daily., Disp: 56 tablet, Rfl: 0   carvedilol (COREG) 6.25 MG tablet, Take 1 tablet (6.25 mg total) by mouth 2 (two) times daily with a meal., Disp: 90 tablet, Rfl: 3   cholecalciferol (VITAMIN D3) 25 MCG (1000 UNIT) tablet, Take 2,000 Units by mouth daily., Disp: , Rfl:    clopidogrel (PLAVIX) 75 MG tablet, TAKE ONE TABLET BY MOUTH ONCE DAILY WITH BREAKFAST,  Disp: 90 tablet, Rfl: 3   Continuous Blood Gluc Sensor (FREESTYLE LIBRE 2 SENSOR) MISC, APPLY SENSOR EVERY 14 DAYS TO MONITOR SUGAR CONTINOUSLY, Disp: 2 each, Rfl: 11   Continuous Glucose Receiver (FREESTYLE LIBRE 2 READER) DEVI, USE WITH SENSORS TO MONITOR SUGAR CONTINUOUSLY, Disp: 1 each, Rfl: 0   Dulaglutide (TRULICITY) 4.5 MG/0.5ML SOPN, Inject 4.5 mg into the skin once a week. Via [name] PAP, Disp: , Rfl:    ENTRESTO 24-26 MG, TAKE ONE TABLET BY MOUTH TWO TIMES DAILY., Disp: 60 tablet, Rfl: 3   ezetimibe (ZETIA) 10 MG tablet, Take 1 tablet (10 mg total) by mouth daily., Disp: 90 tablet, Rfl: 3   furosemide (LASIX) 40 MG tablet, Take 40 mg by mouth daily., Disp: , Rfl:    gabapentin (NEURONTIN) 100 MG capsule, Take 1 capsule (100 mg total) by mouth at bedtime., Disp: 30 capsule, Rfl: 3   insulin aspart (NOVOLOG FLEXPEN) 100 UNIT/ML FlexPen, Inject 13 Units into the skin every morning., Disp: , Rfl:    insulin degludec (TRESIBA FLEXTOUCH) 100 UNIT/ML FlexTouch Pen, Inject 50 Units into the skin daily., Disp: 15 mL, Rfl: 2   isosorbide mononitrate (IMDUR) 30 MG 24 hr tablet, Take 1 tablet (30 mg total) by mouth 2 (two) times daily., Disp: 60 tablet, Rfl: 2   JARDIANCE 10 MG TABS tablet, TAKE ONE TABLET (10 MG TOTAL) BY MOUTH DAILY., Disp: 30 tablet, Rfl: 5   spironolactone (ALDACTONE) 25 MG tablet, TAKE ONE TABLET (25 MG TOTAL) BY MOUTH DAILY., Disp: 30 tablet, Rfl: 5   venlafaxine XR (EFFEXOR-XR) 150 MG 24 hr capsule, TAKE ONE CAPSULE BY MOUTH DAILY WITH BREAKFAST. TAKE WITH EFFEXOR XR 75MG  FOR A TOTAL OF 225MG , Disp: 90 capsule, Rfl: 1   venlafaxine XR (EFFEXOR-XR) 75 MG 24 hr capsule, TAKE ONE CAPSULE BY MOUTH ONCE DAILY. TAKE IN ADDITION TO THE 150 MG CAPSULE FOR A TOTAL DOSE OF 225MG  DAILY, Disp: 90 capsule, Rfl: 1   vitamin B-12 (CYANOCOBALAMIN) 1000 MCG tablet, Take 1,000 mcg by mouth daily., Disp: , Rfl:    HYDROcodone-acetaminophen (NORCO/VICODIN) 5-325 MG tablet, Take 1 tablet by  mouth 3 (three) times daily as needed for moderate pain (pain score 4-6)., Disp: 90 tablet, Rfl: 0   HYDROcodone-acetaminophen (NORCO/VICODIN) 5-325 MG tablet, Take 1 tablet by mouth 3 (three) times daily as needed for moderate pain (pain score 4-6)., Disp: 90 tablet, Rfl: 0   HYDROcodone-acetaminophen (NORCO/VICODIN) 5-325 MG tablet, Take 1 tablet by mouth 3 (three) times daily as needed for moderate pain (pain score 4-6)., Disp: 90 tablet, Rfl: 0   hydrOXYzine (ATARAX) 10 MG tablet, TAKE 1 TABLET BY MOUTH 3 TIMES DAILY AS NEEDED FOR ANXIETY (Patient not taking: Reported on 07/17/2023), Disp: 30 tablet, Rfl: 2   nitroGLYCERIN (NITROSTAT) 0.4 MG SL tablet, DISSOLVE 1 TABLET UNDER TONGUE AS NEEDEDFOR CHEST PAIN. MAY REPEAT 5 MINUTES APART 3 TIMES IF NEEDED, Disp: 25 tablet, Rfl: 3   TRUEPLUS 5-BEVEL PEN NEEDLES  31G X 6 MM MISC, USE TO INJECT INSULIN THREE TIMES A DAY, Disp: 300 each, Rfl: 3 Allergies  Allergen Reactions   Bupropion Nausea Only   Ozempic (0.25 Or 0.5 Mg-Dose) [Semaglutide(0.25 Or 0.5mg -Dos)] Nausea And Vomiting   Atorvastatin Other (See Comments)    Body aches Similar effect with rosuvastatin 40 mg twice weekly      Social History   Socioeconomic History   Marital status: Widowed    Spouse name: Not on file   Number of children: 3   Years of education: Not on file   Highest education level: 7th grade  Occupational History   Occupation: disabled    Associate Professor: UNEMPLOYED    Comment: back injury  Tobacco Use   Smoking status: Never   Smokeless tobacco: Never  Vaping Use   Vaping status: Never Used  Substance and Sexual Activity   Alcohol use: No    Alcohol/week: 0.0 standard drinks of alcohol   Drug use: No   Sexual activity: Not Currently  Other Topics Concern   Not on file  Social History Narrative   Has a roommate, Mr. [name]. No pets.   Social Determinants of Health   Financial Resource Strain: Low Risk  (11/23/2022)   Overall Financial Resource Strain  (CARDIA)    Difficulty of Paying Living Expenses: Not hard at all  Food Insecurity: No Food Insecurity (12/29/2022)   Hunger Vital Sign    Worried About Running Out of Food in the Last Year: Never true    Ran Out of Food in the Last Year: Never true  Transportation Needs: No Transportation Needs (12/29/2022)   PRAPARE - Administrator, Civil Service (Medical): No    Lack of Transportation (Non-Medical): No  Physical Activity: Inactive (11/09/2022)   Exercise Vital Sign    Days of Exercise per Week: 0 days    Minutes of Exercise per Session: 0 min  Stress: No Stress Concern Present (11/09/2022)   [employer] of Occupational Health - Occupational Stress Questionnaire    Feeling of Stress : Only a little  Social Connections: Moderately Isolated (11/09/2022)   Social Connection and Isolation Panel [NHANES]    Frequency of Communication with Friends and Family: More than three times a week    Frequency of Social Gatherings with Friends and Family: More than three times a week    Attends Religious Services: More than 4 times per year    Active Member of [employer] or Organizations: No    Attends Banker Meetings: Never    Marital Status: Widowed  Intimate Partner Violence: Not At Risk (11/09/2022)   Humiliation, Afraid, Rape, and Kick questionnaire    Fear of Current or Ex-Partner: No    Emotionally Abused: No    Physically Abused: No    Sexually Abused: No    Physical Exam      Future Appointments  Date Time Provider Department Center  07/20/2023  2:45 PM Allen Derry [place] III, PA-C [facility] None  10/23/2023  3:30 PM Delma Freeze, FNP [facility] None  11/14/2023 10:45 AM [facility] ANNUAL WELLNESS VISIT 1 [facility]  11/28/2023  3:35 PM Charlsie Quest, NP [facility] None       Kerry Hough, Paramedic [phone] [facility] Paramedic  07/17/23

## 2023-07-19 ENCOUNTER — Telehealth (HOSPITAL_COMMUNITY): Payer: Self-pay

## 2023-07-19 NOTE — Telephone Encounter (Signed)
Patient is now discharged from [facility].  Patient has/has not met the following goals:  Yes :Patient expresses basic understanding of medications and what they are for Yes :Patient able to verbalize heart failure specific dietary/fluid restrictions Yes :Patient is aware of who to call if they have medical concerns or if they need to schedule or change appts Yes :Patient has a scale for daily weights and weighs regularly Yes :Patient able to verbalize concerning symptoms when they should call the HF clinic (weight gain ranges, etc) Yes :Patient has a PCP and has seen within the past year or has upcoming appt Yes :Patient has reliable access to getting their medications Yes :Patient has shown they are able to reorder medications reliably No :Patient has had admission in past 30 days- if yes how many? No :Patient has had admission in past 90 days- if yes how many?  Discharge Comments:  Discussed with Inetta Fermo, HF NP and it is agreeable with all parties that he can be safely graduated and d/c from paramedicine.  He has all resources needed and managing things well and has family support. If anything changes in the future he can be referred again.  He is very willing to take care of himself and do what is needed to manage his health needs and understands all aspects.   Kerry Hough, EMT-Paramedic  [phone] 07/19/2023

## 2023-07-20 ENCOUNTER — Encounter: Payer: PPO | Admitting: Physician Assistant

## 2023-07-20 DIAGNOSIS — L97512 Non-pressure chronic ulcer of other part of right foot with fat layer exposed: Secondary | ICD-10-CM | POA: Diagnosis not present

## 2023-07-20 DIAGNOSIS — E11621 Type 2 diabetes mellitus with foot ulcer: Secondary | ICD-10-CM | POA: Diagnosis not present

## 2023-07-20 DIAGNOSIS — L97412 Non-pressure chronic ulcer of right heel and midfoot with fat layer exposed: Secondary | ICD-10-CM | POA: Diagnosis not present

## 2023-07-21 NOTE — Progress Notes (Signed)
[name], [name] ([id number]) [id number] Page 1 of 11 Visit Report for 07/20/2023 Arrival Information Details Patient Name: Date of Service: [name] [facility] H. 07/20/2023 2:45 PM Medical Record Number: [id number] Patient Account Number: [id number] Date of Birth/Sex: Treating RN: [birth year] (72 y.o. [name] Primary Care Gailene Youkhana: Kerby Nora Other Clinician: Betha Loa Referring Nesha Counihan: Treating Chrissa Meetze/Extender: [name], [name] in Treatment: 24 Visit Information History Since Last Visit Added or deleted any medications: No Patient Arrived: Ambulatory Any new allergies or adverse reactions: No Arrival Time: 14:30 Pain Present Now: No Accompanied By: self Transfer Assistance: None Patient Requires Transmission-Based Precautions: No Patient Has Alerts: Yes Patient Alerts: Patient on Blood Thinner Diabetes type 2 Eliquis/Plavix ABI R 1.23 TBI 0.57 ABI L 0.98 TBI 0.80 Electronic Signature(s) Signed: 07/21/2023 12:46:12 PM By: Midge Aver MSN RN CNS WTA Entered By: Midge Aver on 07/20/2023 14:30:49 -------------------------------------------------------------------------------- Clinic Level of Care Assessment Details Patient Name: Date of Service: [name] [facility] H. 07/20/2023 2:45 PM Medical Record Number: [id number] Patient Account Number: [id number] Date of Birth/Sex: Treating RN: 1951/06/06 (72 y.o. [name] Primary Care Alam Guterrez: Kerby Nora Other Clinician: Referring Aniston Christman: Treating Javonnie Illescas/Extender: [name], [name] in Treatment: 24 Clinic Level of Care Assessment Items TOOL 4 Quantity Score X- 1 0 Use when only an EandM is performed on FOLLOW-UP visit ASSESSMENTS - Nursing Assessment / Reassessment X- 1 10 Reassessment of Co-morbidities (includes updates in patient status) X- 1 5 Reassessment of Adherence to Treatment Plan ASSESSMENTS - Wound and Skin A ssessment / Reassessment []  -  0 Simple Wound Assessment / Reassessment - one wound [name], [name] ([id number]) [phone] Page 2 of 11 X- 3 5 Complex Wound Assessment / Reassessment - multiple wounds []  - 0 Dermatologic / Skin Assessment (not related to wound area) ASSESSMENTS - Focused Assessment []  - 0 Circumferential Edema Measurements - multi extremities []  - 0 Nutritional Assessment / Counseling / Intervention []  - 0 Lower Extremity Assessment (monofilament, tuning fork, pulses) []  - 0 Peripheral Arterial Disease Assessment (using hand held doppler) ASSESSMENTS - Ostomy and/or Continence Assessment and Care []  - 0 Incontinence Assessment and Management []  - 0 Ostomy Care Assessment and Management (repouching, etc.) PROCESS - Coordination of Care []  - 0 Simple Patient / Family Education for ongoing care X- 1 20 Complex (extensive) Patient / Family Education for ongoing care X- 1 10 Staff obtains Chiropractor, Records, T Results / Process Orders est []  - 0 Staff telephones HHA, Nursing Homes / Clarify orders / etc []  - 0 Routine Transfer to another Facility (non-emergent condition) []  - 0 Routine Hospital Admission (non-emergent condition) []  - 0 New Admissions / Manufacturing engineer / Ordering NPWT Apligraf, etc. , []  - 0 Emergency Hospital Admission (emergent condition) []  - 0 Simple Discharge Coordination X- 1 15 Complex (extensive) Discharge Coordination PROCESS - Special Needs []  - 0 Pediatric / Minor Patient Management []  - 0 Isolation Patient Management []  - 0 Hearing / Language / Visual special needs []  - 0 Assessment of Community assistance (transportation, D/C planning, etc.) []  - 0 Additional assistance / Altered mentation []  - 0 Support Surface(s) Assessment (bed, cushion, seat, etc.) INTERVENTIONS - Wound Cleansing / Measurement []  - 0 Simple Wound Cleansing - one wound X- 3 5 Complex Wound Cleansing - multiple wounds X- 1 5 Wound Imaging  (photographs - any number of wounds) []  - 0 Wound Tracing (instead of photographs) []  - 0 Simple Wound Measurement - one wound X- 3  5 Complex Wound Measurement - multiple wounds INTERVENTIONS - Wound Dressings X - Small Wound Dressing one or multiple wounds 3 10 []  - 0 Medium Wound Dressing one or multiple wounds []  - 0 Large Wound Dressing one or multiple wounds []  - 0 Application of Medications - topical []  - 0 Application of Medications - injection INTERVENTIONS - Miscellaneous []  - 0 External ear exam []  - 0 Specimen Collection (cultures, biopsies, blood, body fluids, etc.) [name], [name] ([id number]) [id number] Page 3 of 11 []  - 0 Specimen(s) / Culture(s) sent or taken to Lab for analysis []  - 0 Patient Transfer (multiple staff / Nurse, adult / Similar devices) []  - 0 Simple Staple / Suture removal (25 or less) []  - 0 Complex Staple / Suture removal (26 or more) []  - 0 Hypo / Hyperglycemic Management (close monitor of Blood Glucose) []  - 0 Ankle / Brachial Index (ABI) - do not check if billed separately X- 1 5 Vital Signs Has the patient been seen at the hospital within the last three years: Yes Total Score: 145 Level Of Care: New/Established - Level 4 Electronic Signature(s) Signed: 07/21/2023 12:46:12 PM By: Midge Aver MSN RN CNS WTA Entered By: Midge Aver on 07/20/2023 16:28:41 -------------------------------------------------------------------------------- Encounter Discharge Information Details Patient Name: Date of Service: [name] HN H. 07/20/2023 2:45 PM Medical Record Number: [id number] Patient Account Number: [id number] Date of Birth/Sex: Treating RN: 24-Feb-1951 (72 y.o. [name] Primary Care Cranston Koors: Kerby Nora Other Clinician: Referring Roper Tolson: Treating Baron Parmelee/Extender: [name], [name] in Treatment: 24 Encounter Discharge Information Items Discharge Condition: Stable Ambulatory Status:  Ambulatory Discharge Destination: Home Transportation: Private Auto Accompanied By: self Schedule Follow-up Appointment: Yes Clinical Summary of Care: Electronic Signature(s) Signed: 07/20/2023 4:30:39 PM By: Midge Aver MSN RN CNS WTA Entered By: Midge Aver on 07/20/2023 16:30:38 -------------------------------------------------------------------------------- Lower Extremity Assessment Details Patient Name: Date of Service: [name] HN H. 07/20/2023 2:45 PM Medical Record Number: [id number] Patient Account Number: [id number] Date of Birth/Sex: Treating RN: 1951-04-21 (72 y.o. [name], [name], [name] H ([id number]) [id number] Page 4 of 11 Primary Care Lior Cartelli: Kerby Nora Other Clinician: Betha Loa Referring Makinley Muscato: Treating Promise Bushong/Extender: [name], [name] in Treatment: 24 Electronic Signature(s) Signed: 07/21/2023 12:46:12 PM By: Midge Aver MSN RN CNS WTA Entered By: Midge Aver on 07/20/2023 14:46:54 -------------------------------------------------------------------------------- Multi Wound Chart Details Patient Name: Date of Service: [name] HN H. 07/20/2023 2:45 PM Medical Record Number: [id number] Patient Account Number: [id number] Date of Birth/Sex: Treating RN: [birth year] (72 y.o. [name] Primary Care Ashby Leflore: Kerby Nora Other Clinician: Betha Loa Referring Fae Blossom: Treating Avant Printy/Extender: [name], [name] in Treatment: 24 Vital Signs Height(in): 66 Pulse(bpm): 88 Weight(lbs): 250 Blood Pressure(mmHg): 117/67 Body Mass Index(BMI): 40.3 Temperature(F): 98.0 Respiratory Rate(breaths/min): 18 [1R:Photos:] Right Calcaneus Right, Distal, Dorsal Foot Right, Proximal, Dorsal Foot Wound Location: Gradually Appeared Gradually Appeared Gradually Appeared Wounding Event: Diabetic Wound/Ulcer of the Lower Diabetic Wound/Ulcer of the Lower Diabetic Wound/Ulcer of the  Lower Primary Etiology: Extremity Extremity Extremity Congestive Heart Failure, Coronary Congestive Heart Failure, Coronary Congestive Heart Failure, Coronary Comorbid History: Artery Disease, Hypertension, Type II Artery Disease, Hypertension, Type II Artery Disease, Hypertension, Type II Diabetes, Neuropathy Diabetes, Neuropathy Diabetes, Neuropathy 12/10/2022 12/10/2022 12/10/2022 Date Acquired: 24 24 24  Weeks of Treatment: Open Open Open Wound Status: No No No Wound Recurrence: Yes No No Clustered Wound: 0.4x0.5x0.1 0.8x1x0.1 0.5x1x0.1 Measurements L x W x D (cm) 0.157 0.628 0.393 A (cm) : rea  0.016 0.063 0.039 Volume (cm) : 96.30% 27.30% 84.40% % Reduction in A rea: 96.30% 26.70% 84.50% % Reduction in Volume: Grade 1 Grade 1 Grade 1 Classification: Medium Medium Medium Exudate A mount: Serosanguineous Serosanguineous Serosanguineous Exudate Type: red, brown red, brown red, brown Exudate Color: Large (67-100%) Large (67-100%) Medium (34-66%) Granulation A mount: Red Red, Pink Red Granulation Quality: None Present (0%) None Present (0%) Medium (34-66%) Necrotic A mount: Fat Layer (Subcutaneous Tissue): Yes Fat Layer (Subcutaneous Tissue): Yes Fat Layer (Subcutaneous Tissue): Yes Exposed Structures: Fascia: No Fascia: No Fascia: No Tendon: No Tendon: No Tendon: No Muscle: No Muscle: No Muscle: No [name], [name] ([id number]) [id number] Page 5 of 11 Joint: No Joint: No Joint: No Bone: No Bone: No Bone: No Large (67-100%) Large (67-100%) Small (1-33%) Epithelialization: Treatment Notes Electronic Signature(s) Signed: 07/20/2023 4:26:53 PM By: Midge Aver MSN RN CNS WTA Entered By: Midge Aver on 07/20/2023 16:26:52 -------------------------------------------------------------------------------- Pain Assessment Details Patient Name: Date of Service: [name] HN H. 07/20/2023 2:45 PM Medical Record Number:  [id number] Patient Account Number: [id number] Date of Birth/Sex: Treating RN: [birth year] (72 y.o. [name] Primary Care Ardelle Haliburton: Kerby Nora Other Clinician: Betha Loa Referring Erinn Huskins: Treating Earlyn Sylvan/Extender: [name], [name] in Treatment: 24 Active Problems Location of Pain Severity and Description of Pain Patient Has Paino No Site Locations Pain Management and Medication Current Pain Management: Electronic Signature(s) Signed: 07/21/2023 12:46:12 PM By: Midge Aver MSN RN CNS WTA Entered By: Midge Aver on 07/20/2023 14:36:07 Reardon, [name] ([id number]) [phone].pdf Page 6 of 11 -------------------------------------------------------------------------------- Patient/Caregiver Education Details Patient Name: Date of Service: [name] 11/21/2024andnbsp2:45 PM Medical Record Number: [id number] Patient Account Number: [id number] Date of Birth/Gender: Treating RN: 09-23-1950 (72 y.o. [name] Primary Care Physician: Kerby Nora Other Clinician: Referring Physician: Treating Physician/Extender: [name], [name] in Treatment: 24 Education Assessment Education Provided To: Patient Education Topics Provided Wound/Skin Impairment: Handouts: Caring for Your Ulcer Methods: Explain/Verbal Responses: State content correctly Electronic Signature(s) Signed: 07/21/2023 12:46:12 PM By: Midge Aver MSN RN CNS WTA Entered By: Midge Aver on 07/20/2023 16:29:05 -------------------------------------------------------------------------------- Wound Assessment Details Patient Name: Date of Service: [name] HN H. 07/20/2023 2:45 PM Medical Record Number: [id number] Patient Account Number: [id number] Date of Birth/Sex: Treating RN: 02-Aug-1951 (72 y.o. [name] Primary Care Maize Brittingham: Kerby Nora Other Clinician: Betha Loa Referring Ivah Girardot: Treating Ewell Benassi/Extender: [name], [name] in Treatment: 24 Wound Status Wound Number: 1R Primary Diabetic Wound/Ulcer of the Lower Extremity Etiology: Wound Location: Right Calcaneus Wound Open Wounding Event: Gradually Appeared Status: Date Acquired: 12/10/2022 Comorbid Congestive Heart Failure, Coronary Artery Disease, Weeks Of Treatment: 24 History: Hypertension, Type II Diabetes, Neuropathy Clustered Wound: Yes Photos [name], [name] ([id number]) [id number] Page 7 of 11 Wound Measurements Length: (cm) 0.4 Width: (cm) 0.5 Depth: (cm) 0.1 Area: (cm) 0.157 Volume: (cm) 0.016 % Reduction in Area: 96.3% % Reduction in Volume: 96.3% Epithelialization: Large (67-100%) Wound Description Classification: Grade 1 Exudate Amount: Medium Exudate Type: Serosanguineous Exudate Color: red, brown Foul Odor After Cleansing: No Slough/Fibrino Yes Wound Bed Granulation Amount: Large (67-100%) Exposed Structure Granulation Quality: Red Fascia Exposed: No Necrotic Amount: None Present (0%) Fat Layer (Subcutaneous Tissue) Exposed: Yes Tendon Exposed: No Muscle Exposed: No Joint Exposed: No Bone Exposed: No Treatment Notes Wound #1R (Calcaneus) Wound Laterality: Right Cleanser Byram Ancillary Kit - 15 Day Supply Discharge Instruction: Use supplies as instructed; Kit contains: (15) Saline Bullets; (15) 3x3 Gauze; 15 pr  Gloves Soap and Water Discharge Instruction: Gently cleanse wound with antibacterial soap, rinse and pat dry prior to dressing wounds Peri-Wound Care Topical Primary Dressing Hydrofera Blue Ready Transfer Foam, 2.5x2.5 (in/in) Discharge Instruction: Apply Hydrofera Blue Ready to wound bed as directed Secondary Dressing (BORDER) Zetuvit Plus SILICONE BORDER Dressing 4x4 (in/in) Discharge Instruction: Please do not put silicone bordered dressings under wraps. Use non-bordered dressing only. Secured With Compression Wrap Compression  Stockings Facilities manager) Signed: 07/21/2023 12:46:12 PM By: Midge Aver MSN RN CNS WTA Entered By: Midge Aver on 07/20/2023 14:45:45 [name], [name] ([id number]) [id number] Page 8 of 11 -------------------------------------------------------------------------------- Wound Assessment Details Patient Name: Date of Service: [name] 07/20/2023 2:45 PM Medical Record Number: [id number] Patient Account Number: [id number] Date of Birth/Sex: Treating RN: 09-29-1950 (72 y.o. [name] Primary Care Ricci Dirocco: Kerby Nora Other Clinician: Betha Loa Referring Elden Brucato: Treating Isair Inabinet/Extender: [name], [name] in Treatment: 24 Wound Status Wound Number: 2R Primary Diabetic Wound/Ulcer of the Lower Extremity Etiology: Wound Location: Right, Distal, Dorsal Foot Wound Open Wounding Event: Gradually Appeared Status: Date Acquired: 12/10/2022 Comorbid Congestive Heart Failure, Coronary Artery Disease, Weeks Of Treatment: 24 History: Hypertension, Type II Diabetes, Neuropathy Clustered Wound: No Photos Wound Measurements Length: (cm) 0.8 Width: (cm) 1 Depth: (cm) 0.1 Area: (cm) 0.628 Volume: (cm) 0.063 % Reduction in Area: 27.3% % Reduction in Volume: 26.7% Epithelialization: Large (67-100%) Wound Description Classification: Grade 1 Exudate Amount: Medium Exudate Type: Serosanguineous Exudate Color: red, brown Foul Odor After Cleansing: No Slough/Fibrino No Wound Bed Granulation Amount: Large (67-100%) Exposed Structure Granulation Quality: Red, Pink Fascia Exposed: No Necrotic Amount: None Present (0%) Fat Layer (Subcutaneous Tissue) Exposed: Yes Tendon Exposed: No Muscle Exposed: No Joint Exposed: No Bone Exposed: No Treatment Notes Wound #2R (Foot) Wound Laterality: Dorsal, Right, Distal Cleanser Byram Ancillary Kit - 15 Day Supply Discharge Instruction: Use supplies as instructed; Kit  contains: (15) Saline Bullets; (15) 3x3 Gauze; 15 pr [address] [name], [name] ([id number]) [id number] Page 9 of 11 Discharge Instruction: Gently cleanse wound with antibacterial soap, rinse and pat dry prior to dressing wounds Peri-Wound Care Topical Primary Dressing Hydrofera Blue Ready Transfer Foam, 2.5x2.5 (in/in) Discharge Instruction: Apply Hydrofera Blue Ready to wound bed as directed Secondary Dressing (BORDER) Zetuvit Plus SILICONE BORDER Dressing 4x4 (in/in) Discharge Instruction: Please do not put silicone bordered dressings under wraps. Use non-bordered dressing only. Secured With Compression Wrap Compression Stockings Facilities manager) Signed: 07/21/2023 12:46:12 PM By: Midge Aver MSN RN CNS WTA Entered By: Midge Aver on 07/20/2023 14:46:09 -------------------------------------------------------------------------------- Wound Assessment Details Patient Name: Date of Service: [name] HN H. 07/20/2023 2:45 PM Medical Record Number: [id number] Patient Account Number: [id number] Date of Birth/Sex: Treating RN: [birth year] (72 y.o. [name] Primary Care Marshel Golubski: Kerby Nora Other Clinician: Betha Loa Referring Mayrene Bastarache: Treating Leila Schuff/Extender: [name], [name] in Treatment: 24 Wound Status Wound Number: 3 Primary Diabetic Wound/Ulcer of the Lower Extremity Etiology: Wound Location: Right, Proximal, Dorsal Foot Wound Open Wounding Event: Gradually Appeared Status: Date Acquired: 12/10/2022 Comorbid Congestive Heart Failure, Coronary Artery Disease, Weeks Of Treatment: 24 History: Hypertension, Type II Diabetes, Neuropathy Clustered Wound: No Photos Wound Measurements Length: (cm) 0.5 Width: (cm) 1 Depth: (cm) 0.1 Area: (cm) 0.393 [name], [name] ([id number]) Volume: (cm) 0.039 % Reduction in Area: 84.4% % Reduction in Volume: 84.5% Epithelialization: Small  (1-33%) [id number] Page 10 of 11 Wound Description Classification: Grade 1 Exudate Amount: Medium Exudate Type: Serosanguineous Exudate  Color: red, brown Foul Odor After Cleansing: No Slough/Fibrino Yes Wound Bed Granulation Amount: Medium (34-66%) Exposed Structure Granulation Quality: Red Fascia Exposed: No Necrotic Amount: Medium (34-66%) Fat Layer (Subcutaneous Tissue) Exposed: Yes Necrotic Quality: Adherent Slough Tendon Exposed: No Muscle Exposed: No Joint Exposed: No Bone Exposed: No Treatment Notes Wound #3 (Foot) Wound Laterality: Dorsal, Right, Proximal Cleanser Byram Ancillary Kit - 15 Day Supply Discharge Instruction: Use supplies as instructed; Kit contains: (15) Saline Bullets; (15) 3x3 Gauze; 15 pr Gloves Soap and Water Discharge Instruction: Gently cleanse wound with antibacterial soap, rinse and pat dry prior to dressing wounds Peri-Wound Care Topical Primary Dressing Hydrofera Blue Ready Transfer Foam, 2.5x2.5 (in/in) Discharge Instruction: Apply Hydrofera Blue Ready to wound bed as directed Secondary Dressing (BORDER) Zetuvit Plus SILICONE BORDER Dressing 4x4 (in/in) Discharge Instruction: Please do not put silicone bordered dressings under wraps. Use non-bordered dressing only. Secured With Compression Wrap Compression Stockings Facilities manager) Signed: 07/21/2023 12:46:12 PM By: Midge Aver MSN RN CNS WTA Entered By: Midge Aver on 07/20/2023 14:46:35 -------------------------------------------------------------------------------- Vitals Details Patient Name: Date of Service: [name] HN H. 07/20/2023 2:45 PM Medical Record Number: [id number] Patient Account Number: [id number] Date of Birth/Sex: Treating RN: 1950/10/16 (72 y.o. [name] Primary Care Ryot Burrous: Kerby Nora Other Clinician: Betha Loa Referring Nataya Bastedo: Treating Phoenyx Paulsen/Extender: [name], [name] in  Treatment: [address] [name], [name] ([id number]) [id number] Page 11 of 11 Time Taken: 14:33 Temperature (F): 98.0 Height (in): 66 Pulse (bpm): 88 Weight (lbs): 250 Respiratory Rate (breaths/min): 18 Body Mass Index (BMI): 40.3 Blood Pressure (mmHg): 117/67 Reference Range: 80 - 120 mg / dl Electronic Signature(s) Signed: 07/21/2023 12:46:12 PM By: Midge Aver MSN RN CNS WTA Entered By: Midge Aver on 07/20/2023 14:35:53

## 2023-07-21 NOTE — Progress Notes (Signed)
AIRIC, SALMELA ([id number]) [id number] Page 1 of 8 Visit Report for 07/20/2023 Chief Complaint Document Details Patient Name: Date of Service: [name] [facility] H. 07/20/2023 2:45 PM Medical Record Number: [id number] Patient Account Number: [id number] Date of Birth/Sex: Treating RN: [birth year] (72 y.o. [name] Primary Care Provider: Kerby Nora Other Clinician: Referring Provider: Treating Provider/Extender: [name], [name] in Treatment: 24 Information Obtained from: Patient Chief Complaint Right foot ulcers Electronic Signature(s) Signed: 07/20/2023 5:35:33 PM By: Allen Derry PA-C Entered By: Allen Derry on 07/20/2023 14:35:33 -------------------------------------------------------------------------------- HPI Details Patient Name: Date of Service: [name] [facility] H. 07/20/2023 2:45 PM Medical Record Number: [id number] Patient Account Number: [id number] Date of Birth/Sex: Treating RN: December 20, 1950 (72 y.o. [name] Primary Care Provider: Kerby Nora Other Clinician: Referring Provider: Treating Provider/Extender: [name], [name] in Treatment: 24 History of Present Illness HPI Description: 01-31-2023 upon evaluation today patient appears to be doing poorly currently in regard to wounds on his right heel, right dorsal foot, and right lateral foot. Subsequently he does have known peripheral vascular disease and again this is something that he needs to I think Checked formally. This is what the majority of the conversation today hinged around and the patient voiced understanding as far as that is concerned. Fortunately I do not see any signs of active infection locally nor systemically which is great news. Patient does have a history of diabetes mellitus type 2, atrial fibrillation for which she is on long-term anticoagulant Therapy, hypertension, and coronary artery disease. Patient's hemoglobin A1c most recently was on  11-17-2022 and was 7.2 and currently he is on Eliquis and Plavix. 02-07-2023 upon evaluation today patient appears to be doing well currently in regard to his wounds all things considered I feel like we are still maintaining that does not seem like it is any worse is also not significantly better. I discussed with the patient that I do believe he would benefit from the arterial evaluation we still need to get this done as quickly as possible and subsequently we did put in a follow-up call with the vascular office today with regard to this. 02-14-2023 upon evaluation today patient appears to be doing a little better in regard to his wounds which are showing signs of loosening which is great news. With that being said he is experiencing an improvement overall in his symptoms and we are still waiting on the vascular evaluation want to get this result and consider whether or not his blood flow is good or not we will be able to make a better determination of next steps. For now we will try and avoid any aggressive sharp debridement to know that he has good arterial flow. [name], [name] ([id number]) [id number] Page 2 of 8 02-21-2023 upon evaluation today patient appears to be doing about the same in regard to his wound. Fortunately there does not appear to be any signs of active infection at this time which is great news. No fevers, chills, nausea, vomiting, or diarrhea. I did review patient's arterial study and it appears that he has pretty good flow in the left is not perfect but it is decent. On the right however he is definitely not doing nearly as good and I think that he is going to need to see one of the vascular doctors for further evaluation and treatment of this right leg in order to get these wounds to heal. 02-28-2023 upon evaluation today patient appears to be doing well currently  in regard to his wound. He has been tolerating the dressing changes without complication.  Fortunately I do not see any evidence of active infection locally nor systemically at this time. I do think that the eschar started to soften up and I would like to try to get some of this off to see if we can get things moving in the right direction. He is in agreement with this plan. 03-09-2023 upon evaluation today patient appears to be doing well currently in regard to his wound. He has been tolerating the dressing changes without complication. Fortunately there does not appear to be any signs of active infection locally nor systemically at this time which is great news. I do believe clearing out some of the necrotic debris last week has helped to a degree. 03-16-2023 upon evaluation today patient appears to be doing well currently in regard to his wounds. He is going to be having a vascular angiogram in order to open up blood flow and I think this is going to be very beneficial for him. Fortunately I do not see any evidence of active infection locally nor systemically which is great news. 7/25; patient with predominantly ischemic wounds to on his dorsal foot and 1 on the right heel. He is going for his angiogram on Monday. He has been using the Iodoflex to the wound and changing this dressing himself. 04-11-2023 upon evaluation today patient appears to be doing well currently in regard to his wounds. In fact he had the arterial procedure with vascular and the great news is he actually has much improved blood flow he had a 95% blockage of the popliteal as well as the posterior tibial based on the record review. Post procedure he now has a residual of only about 30% in the popliteal and closer around 10% in the posterior tibial. The blood flow is greatly improved and it is obvious based on what we are seeing. 04-20-2023 upon evaluation today patient appears to be doing well currently in regard to his wounds. They are actually showing signs of excellent improvement and to be honest now that he is got  good arterial flow going he is really healing quite nicely. Fortunately I do not see any signs of active infection locally or systemically which is great news. 8/29 this is a patient who has known arterial insufficiency. She has 2 wounds on the dorsal right foot and 1 on the back of the heel. She had an angiogram at the end of July and had follow-up arterial studies yesterday but I cannot pull up the arterial studies. We are using Prisma and Zituvimet and really doing quite well. The wounds are better 05-08-2023 upon evaluation today patient appears to be doing well currently in regard to his ulcers. He has been tolerating the dressing changes without complication. Fortunately there does not appear to be any signs of active infection looking or systemically at this time. 05-29-2023 upon evaluation today patient appears to be doing well currently in regard to his wounds are all pretty healthy appearing. With that being said unfortunately he is having some issues here with to the wounds that were previously pretty much closed having reopened. Either way I think that the wounds are healthy appearing. 06-06-2023 upon evaluation today patient's wounds appear to be doing well though the areas actually appear to be a little bit irritated due to what appears to be drainage from the Band-Aids. I think we need to go back to doing the Zetuvit and wrapping as opposed  to using the Band-Aids. 06-13-2023 upon evaluation today patient appears to be doing excellent in regard to all 3 wound locations. He has been tolerating the dressing changes without complication and in general I believe that he is making excellent headway towards closure which is great news. No fevers, chills, nausea, vomiting, or diarrhea. 06-22-2023 upon evaluation today patient actually appears to be making good headway towards closure I am actually very pleased with where we stand today. I do not see any signs of active infection locally or  systemically which is great news and in general I do think that we are making good headway towards complete closure which is great news. 06-30-2023 upon evaluation today patient appears to be doing well currently in regard to his wounds they are not getting quite a small sound like to see they are getting a little smaller here and there at set for the 1 on the distal foot right side which actually showing signs of plateauing. Working to see how things continue to move but if it does not significantly improved by next week I may consider looking into Apligraf for him as a possibility. The patient is in agreement with that plan. 07-11-2023 upon evaluation today patient appears to be doing okay currently in regard to his wounds although the wound on the top of his foot approximately is actually larger I think is because is rubbing on his shoe. I believe that he may do better to switch out for different shoes. I recommended that tennis shoes in particular would probably be the best way to go. 07-20-2023 upon evaluation today patient appears to be doing well currently in regard to the wounds on his right foot. This is actually the best that have seen them in quite some time. Fortunately I do not see any evidence of worsening at this point with regard to the wounds and I think that he is making good headway here towards closure. Electronic Signature(s) Signed: 07/20/2023 5:35:55 PM By: Allen Derry PA-C Entered By: Allen Derry on 07/20/2023 14:35:55 -------------------------------------------------------------------------------- Physical Exam Details Patient Name: Date of Service: [name] HN H. 07/20/2023 2:45 PM Medical Record Number: [id number] Patient Account Number: [id number] William, [name] ([id number]) [id number] Page 3 of 8 Date of Birth/Sex: Treating RN: 11-17-1950 (72 y.o. [name] Primary Care Provider: Other Clinician: Kerby Nora Referring  Provider: Treating Provider/Extender: [name], [name] in Treatment: 24 Constitutional Obese and well-hydrated in no acute distress. Respiratory normal breathing without difficulty. Psychiatric this patient is able to make decisions and demonstrates good insight into disease process. Alert and Oriented x 3. pleasant and cooperative. Notes Upon inspection patient's wound bed actually showed signs of good granulation epithelization at this point. Fortunately I do not see any signs of worsening I believe that the patient is making headway here in fact this looks better he actually did get some new shoes. Electronic Signature(s) Signed: 07/20/2023 5:36:10 PM By: Allen Derry PA-C Entered By: Allen Derry on 07/20/2023 14:36:09 -------------------------------------------------------------------------------- Physician Orders Details Patient Name: Date of Service: [name] HN H. 07/20/2023 2:45 PM Medical Record Number: [id number] Patient Account Number: [id number] Date of Birth/Sex: Treating RN: 01/29/1951 (72 y.o. [name] Primary Care Provider: Kerby Nora Other Clinician: Referring Provider: Treating Provider/Extender: [name], [name] in Treatment: 24 The following information was scribed by: Midge Aver The information was scribed for: Allen Derry Verbal / Phone Orders: No Diagnosis Coding Follow-up Appointments Return Appointment in 1 week. Bathing/ [employer] technical [employer]  Wash wounds with antibacterial soap and water. Anesthetic (Use 'Patient Medications' Section for Anesthetic Order Entry) Lidocaine applied to wound bed Edema Control - Orders / Instructions Elevate, Exercise Daily and A void Standing for Long Periods of Time. Elevate legs to the level of the heart and pump ankles as often as possible Elevate leg(s) parallel to the floor when sitting. Wound Treatment Wound #1R - Calcaneus Wound Laterality: Right Cleanser: Byram Ancillary Kit -  15 Day Supply (Generic) Every Other Day/30 Days Discharge Instructions: Use supplies as instructed; Kit contains: (15) Saline Bullets; (15) 3x3 Gauze; 15 pr Gloves Cleanser: Soap and Water Every Other Day/30 Days Discharge Instructions: Gently cleanse wound with antibacterial soap, rinse and pat dry prior to dressing wounds Prim Dressing: Hydrofera Blue Ready Transfer Foam, 2.5x2.5 (in/in) Every Other Day/30 Days ary Discharge Instructions: Apply Hydrofera Blue Ready to wound bed as directed AHMOD, ANELLI ([id number]) [id number] Page 4 of 8 Secondary Dressing: (BORDER) Zetuvit Plus SILICONE BORDER Dressing 4x4 (in/in) (Generic) Every Other Day/30 Days Discharge Instructions: Please do not put silicone bordered dressings under wraps. Use non-bordered dressing only. Wound #2R - Foot Wound Laterality: Dorsal, Right, Distal Cleanser: Byram Ancillary Kit - 15 Day Supply (Generic) Every Other Day/30 Days Discharge Instructions: Use supplies as instructed; Kit contains: (15) Saline Bullets; (15) 3x3 Gauze; 15 pr Gloves Cleanser: Soap and Water Every Other Day/30 Days Discharge Instructions: Gently cleanse wound with antibacterial soap, rinse and pat dry prior to dressing wounds Prim Dressing: Hydrofera Blue Ready Transfer Foam, 2.5x2.5 (in/in) Every Other Day/30 Days ary Discharge Instructions: Apply Hydrofera Blue Ready to wound bed as directed Secondary Dressing: (BORDER) Zetuvit Plus SILICONE BORDER Dressing 4x4 (in/in) (Generic) Every Other Day/30 Days Discharge Instructions: Please do not put silicone bordered dressings under wraps. Use non-bordered dressing only. Wound #3 - Foot Wound Laterality: Dorsal, Right, Proximal Cleanser: Byram Ancillary Kit - 15 Day Supply (Generic) Every Other Day/30 Days Discharge Instructions: Use supplies as instructed; Kit contains: (15) Saline Bullets; (15) 3x3 Gauze; 15 pr Gloves Cleanser: Soap and Water Every Other Day/30  Days Discharge Instructions: Gently cleanse wound with antibacterial soap, rinse and pat dry prior to dressing wounds Prim Dressing: Hydrofera Blue Ready Transfer Foam, 2.5x2.5 (in/in) Every Other Day/30 Days ary Discharge Instructions: Apply Hydrofera Blue Ready to wound bed as directed Secondary Dressing: (BORDER) Zetuvit Plus SILICONE BORDER Dressing 4x4 (in/in) (Generic) Every Other Day/30 Days Discharge Instructions: Please do not put silicone bordered dressings under wraps. Use non-bordered dressing only. Electronic Signature(s) Signed: 07/20/2023 4:27:26 PM By: Midge Aver MSN RN CNS WTA Signed: 07/20/2023 6:01:43 PM By: Allen Derry PA-C Entered By: Midge Aver on 07/20/2023 13:27:25 -------------------------------------------------------------------------------- Problem List Details Patient Name: Date of Service: [name] HN H. 07/20/2023 2:45 PM Medical Record Number: [id number] Patient Account Number: [id number] Date of Birth/Sex: Treating RN: August 21, 1951 (72 y.o. [name] Primary Care Provider: Kerby Nora Other Clinician: Referring Provider: Treating Provider/Extender: [name], [name] in Treatment: 24 Active Problems ICD-10 Encounter Code Description Active Date MDM Diagnosis E11.621 Type 2 diabetes mellitus with foot ulcer 01/31/2023 No Yes L97.512 Non-pressure chronic ulcer of other part of right foot with fat layer exposed 01/31/2023 No Yes I25.10 Atherosclerotic heart disease of native coronary artery without angina pectoris 01/31/2023 No Yes [name], [name] ([id number]) [id number] Page 5 of 8 I48.0 Paroxysmal atrial fibrillation 01/31/2023 No Yes Z79.01 Long term (current) use of anticoagulants 01/31/2023 No Yes I10 Essential (primary) hypertension 01/31/2023 No Yes Inactive Problems Resolved Problems Electronic  Signature(s) Signed: 07/20/2023 5:35:30 PM By: Allen Derry PA-C Entered By: Allen Derry on 07/20/2023  14:35:30 -------------------------------------------------------------------------------- Progress Note Details Patient Name: Date of Service: [name] HN H. 07/20/2023 2:45 PM Medical Record Number: [id number] Patient Account Number: [id number] Date of Birth/Sex: Treating RN: [birth year] (72 y.o. [name] Primary Care Provider: Kerby Nora Other Clinician: Referring Provider: Treating Provider/Extender: [name], [name] in Treatment: 24 Subjective Chief Complaint Information obtained from Patient Right foot ulcers History of Present Illness (HPI) 01-31-2023 upon evaluation today patient appears to be doing poorly currently in regard to wounds on his right heel, right dorsal foot, and right lateral foot. Subsequently he does have known peripheral vascular disease and again this is something that he needs to I think Checked formally. This is what the majority of the conversation today hinged around and the patient voiced understanding as far as that is concerned. Fortunately I do not see any signs of active infection locally nor systemically which is great news. Patient does have a history of diabetes mellitus type 2, atrial fibrillation for which she is on long-term anticoagulant Therapy, hypertension, and coronary artery disease. Patient's hemoglobin A1c most recently was on 11-17-2022 and was 7.2 and currently he is on Eliquis and Plavix. 02-07-2023 upon evaluation today patient appears to be doing well currently in regard to his wounds all things considered I feel like we are still maintaining that does not seem like it is any worse is also not significantly better. I discussed with the patient that I do believe he would benefit from the arterial evaluation we still need to get this done as quickly as possible and subsequently we did put in a follow-up call with the vascular office today with regard to this. 02-14-2023 upon evaluation today patient appears to be doing a  little better in regard to his wounds which are showing signs of loosening which is great news. With that being said he is experiencing an improvement overall in his symptoms and we are still waiting on the vascular evaluation want to get this result and consider whether or not his blood flow is good or not we will be able to make a better determination of next steps. For now we will try and avoid any aggressive sharp debridement to know that he has good arterial flow. 02-21-2023 upon evaluation today patient appears to be doing about the same in regard to his wound. Fortunately there does not appear to be any signs of active infection at this time which is great news. No fevers, chills, nausea, vomiting, or diarrhea. I did review patient's arterial study and it appears that he has pretty good flow in the left is not perfect but it is decent. On the right however he is definitely not doing nearly as good and I think that he is going to need to see one of the vascular doctors for further evaluation and treatment of this right leg in order to get these wounds to heal. 02-28-2023 upon evaluation today patient appears to be doing well currently in regard to his wound. He has been tolerating the dressing changes without complication. Fortunately I do not see any evidence of active infection locally nor systemically at this time. I do think that the eschar started to soften up and I would like to try to get some of this off to see if we can get things moving in the right direction. He is in agreement with this plan. [name], [name] ([id number]) [id number]  Page 6 of 8 03-09-2023 upon evaluation today patient appears to be doing well currently in regard to his wound. He has been tolerating the dressing changes without complication. Fortunately there does not appear to be any signs of active infection locally nor systemically at this time which is great news. I do believe clearing out  some of the necrotic debris last week has helped to a degree. 03-16-2023 upon evaluation today patient appears to be doing well currently in regard to his wounds. He is going to be having a vascular angiogram in order to open up blood flow and I think this is going to be very beneficial for him. Fortunately I do not see any evidence of active infection locally nor systemically which is great news. 7/25; patient with predominantly ischemic wounds to on his dorsal foot and 1 on the right heel. He is going for his angiogram on Monday. He has been using the Iodoflex to the wound and changing this dressing himself. 04-11-2023 upon evaluation today patient appears to be doing well currently in regard to his wounds. In fact he had the arterial procedure with vascular and the great news is he actually has much improved blood flow he had a 95% blockage of the popliteal as well as the posterior tibial based on the record review. Post procedure he now has a residual of only about 30% in the popliteal and closer around 10% in the posterior tibial. The blood flow is greatly improved and it is obvious based on what we are seeing. 04-20-2023 upon evaluation today patient appears to be doing well currently in regard to his wounds. They are actually showing signs of excellent improvement and to be honest now that he is got good arterial flow going he is really healing quite nicely. Fortunately I do not see any signs of active infection locally or systemically which is great news. 8/29 this is a patient who has known arterial insufficiency. She has 2 wounds on the dorsal right foot and 1 on the back of the heel. She had an angiogram at the end of July and had follow-up arterial studies yesterday but I cannot pull up the arterial studies. We are using Prisma and Zituvimet and really doing quite well. The wounds are better 05-08-2023 upon evaluation today patient appears to be doing well currently in regard to his ulcers. He  has been tolerating the dressing changes without complication. Fortunately there does not appear to be any signs of active infection looking or systemically at this time. 05-29-2023 upon evaluation today patient appears to be doing well currently in regard to his wounds are all pretty healthy appearing. With that being said unfortunately he is having some issues here with to the wounds that were previously pretty much closed having reopened. Either way I think that the wounds are healthy appearing. 06-06-2023 upon evaluation today patient's wounds appear to be doing well though the areas actually appear to be a little bit irritated due to what appears to be drainage from the Band-Aids. I think we need to go back to doing the Zetuvit and wrapping as opposed to using the Band-Aids. 06-13-2023 upon evaluation today patient appears to be doing excellent in regard to all 3 wound locations. He has been tolerating the dressing changes without complication and in general I believe that he is making excellent headway towards closure which is great news. No fevers, chills, nausea, vomiting, or diarrhea. 06-22-2023 upon evaluation today patient actually appears to be making good headway towards  closure I am actually very pleased with where we stand today. I do not see any signs of active infection locally or systemically which is great news and in general I do think that we are making good headway towards complete closure which is great news. 06-30-2023 upon evaluation today patient appears to be doing well currently in regard to his wounds they are not getting quite a small sound like to see they are getting a little smaller here and there at set for the 1 on the distal foot right side which actually showing signs of plateauing. Working to see how things continue to move but if it does not significantly improved by next week I may consider looking into Apligraf for him as a possibility. The patient is in agreement  with that plan. 07-11-2023 upon evaluation today patient appears to be doing okay currently in regard to his wounds although the wound on the top of his foot approximately is actually larger I think is because is rubbing on his shoe. I believe that he may do better to switch out for different shoes. I recommended that tennis shoes in particular would probably be the best way to go. 07-20-2023 upon evaluation today patient appears to be doing well currently in regard to the wounds on his right foot. This is actually the best that have seen them in quite some time. Fortunately I do not see any evidence of worsening at this point with regard to the wounds and I think that he is making good headway here towards closure. Objective Constitutional Obese and well-hydrated in no acute distress. Vitals Time Taken: 2:33 PM, Height: 66 in, Weight: 250 lbs, BMI: 40.3, Temperature: 98.0 F, Pulse: 88 bpm, Respiratory Rate: 18 breaths/min, Blood Pressure: 117/67 mmHg. Respiratory normal breathing without difficulty. Psychiatric this patient is able to make decisions and demonstrates good insight into disease process. Alert and Oriented x 3. pleasant and cooperative. General Notes: Upon inspection patient's wound bed actually showed signs of good granulation epithelization at this point. Fortunately I do not see any signs of worsening I believe that the patient is making headway here in fact this looks better he actually did get some new shoes. Integumentary (Hair, Skin) Wound #1R status is Open. Original cause of wound was Gradually Appeared. The date acquired was: 12/10/2022. The wound has been in treatment 24 weeks. The wound is located on the Right Calcaneus. The wound measures 0.4cm length x 0.5cm width x 0.1cm depth; 0.157cm^2 area and 0.016cm^3 volume. There is Fat Layer (Subcutaneous Tissue) exposed. There is a medium amount of serosanguineous drainage noted. There is large (67-100%) red granulation  within the wound bed. There is no necrotic tissue within the wound bed. Wound #2R status is Open. Original cause of wound was Gradually Appeared. The date acquired was: 12/10/2022. The wound has been in treatment 24 weeks. The wound is located on the Right,Distal,Dorsal Foot. The wound measures 0.8cm length x 1cm width x 0.1cm depth; 0.628cm^2 area and 0.063cm^3 volume. There is Fat Layer (Subcutaneous Tissue) exposed. There is a medium amount of serosanguineous drainage noted. There is large (67-100%) red, pink [name], [name] ([id number]) [id number] Page 7 of 8 granulation within the wound bed. There is no necrotic tissue within the wound bed. Wound #3 status is Open. Original cause of wound was Gradually Appeared. The date acquired was: 12/10/2022. The wound has been in treatment 24 weeks. The wound is located on the Right,Proximal,Dorsal Foot. The wound measures 0.5cm length x 1cm width x  0.1cm depth; 0.393cm^2 area and 0.039cm^3 volume. There is Fat Layer (Subcutaneous Tissue) exposed. There is a medium amount of serosanguineous drainage noted. There is medium (34-66%) red granulation within the wound bed. There is a medium (34-66%) amount of necrotic tissue within the wound bed including Adherent Slough. 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 Bathing/ Shower/ Hygiene: Wash wounds with antibacterial soap and water. Anesthetic (Use 'Patient Medications' Section for Anesthetic Order Entry): Lidocaine applied to wound bed Edema Control - Orders / Instructions: Elevate, Exercise Daily and Avoid Standing for  Long Periods of Time. Elevate legs to the level of the heart and pump ankles as often as possible Elevate leg(s) parallel to the floor when sitting. WOUND #1R: - Calcaneus Wound Laterality: Right Cleanser: Byram Ancillary Kit - 15 Day Supply (Generic) Every Other Day/30 Days Discharge Instructions: Use supplies as instructed; Kit contains: (15) Saline Bullets; (15) 3x3 Gauze; 15 pr Gloves Cleanser: Soap and Water Every Other Day/30 Days Discharge Instructions: Gently cleanse wound with antibacterial soap, rinse and pat dry prior to dressing wounds Prim Dressing: Hydrofera Blue Ready Transfer Foam, 2.5x2.5 (in/in) Every Other Day/30 Days ary Discharge Instructions: Apply Hydrofera Blue Ready to wound bed as directed Secondary Dressing: (BORDER) Zetuvit Plus SILICONE BORDER Dressing 4x4 (in/in) (Generic) Every Other Day/30 Days Discharge Instructions: Please do not put silicone bordered dressings under wraps. Use non-bordered dressing only. WOUND #2R: - Foot Wound Laterality: Dorsal, Right, Distal Cleanser: Byram Ancillary Kit - 15 Day Supply (Generic) Every Other Day/30 Days Discharge Instructions: Use supplies as instructed; Kit contains: (15) Saline Bullets; (15) 3x3 Gauze; 15 pr Gloves Cleanser: Soap and Water Every Other Day/30 Days Discharge Instructions: Gently cleanse wound with antibacterial soap, rinse and pat dry prior to dressing wounds Prim Dressing: Hydrofera Blue Ready Transfer Foam, 2.5x2.5 (in/in) Every Other Day/30 Days ary Discharge Instructions: Apply Hydrofera Blue Ready to wound bed as directed Secondary Dressing: (BORDER) Zetuvit Plus SILICONE BORDER Dressing 4x4 (in/in) (Generic) Every Other Day/30 Days Discharge Instructions: Please do not put silicone bordered dressings under wraps. Use non-bordered dressing only. WOUND #3: - Foot Wound Laterality: Dorsal, Right, Proximal Cleanser: Byram Ancillary Kit - 15 Day Supply (Generic) Every Other Day/30 Days Discharge  Instructions: Use supplies as instructed; Kit contains: (15) Saline Bullets; (15) 3x3 Gauze; 15 pr Gloves Cleanser: Soap and Water Every Other Day/30 Days Discharge Instructions: Gently cleanse wound with antibacterial soap, rinse and pat dry prior to dressing wounds Prim Dressing: Hydrofera Blue Ready Transfer Foam, 2.5x2.5 (in/in) Every Other Day/30 Days ary Discharge Instructions: Apply Hydrofera Blue Ready to wound bed as directed Secondary Dressing: (BORDER) Zetuvit Plus SILICONE BORDER Dressing 4x4 (in/in) (Generic) Every Other Day/30 Days Discharge Instructions: Please do not put silicone bordered dressings under wraps. Use non-bordered dressing only. 1. I would recommend that the patient should continue to monitor for any signs of infection or worsening. Based on what I see I feel like there were making really good headway here. 2. I am going to recommend the patient should continue with the [facility] which is doing well. We will see patient back for reevaluation in 1 week here in the clinic. If anything worsens or changes patient will contact our office for additional recommendations. Electronic  Signature(s) Signed: 07/20/2023 5:36:28 PM By: Allen Derry PA-C Entered By: Allen Derry on 07/20/2023 14:36:28 [name] ([id number]) [id number] Page 8 of 8 -------------------------------------------------------------------------------- SuperBill Details Patient Name: Date of Service: [name] 07/20/2023 Medical Record Number: [id number] Patient Account Number: [id number] Date of Birth/Sex: Treating RN: [birth year] (72 y.o. [name] Primary Care Provider: Kerby Nora Other Clinician: Referring Provider: Treating Provider/Extender: [name], [name] in Treatment: 24 Diagnosis Coding ICD-10 Codes Code Description E11.621 Type 2 diabetes mellitus with foot ulcer L97.512 Non-pressure chronic ulcer of other part of right  foot with fat layer exposed I25.10 Atherosclerotic heart disease of native coronary artery without angina pectoris I48.0 Paroxysmal atrial fibrillation Z79.01 Long term (current) use of anticoagulants I10 Essential (primary) hypertension Facility Procedures : CPT4 Code: [id number] Description: 99214 - WOUND CARE VISIT-LEV 4 EST PT Modifier: Quantity: 1 Physician Procedures : CPT4 Code Description Modifier [id number] - WC PHYS LEVEL 3 - EST PT ICD-10 Diagnosis Description E11.621 Type 2 diabetes mellitus with foot ulcer L97.512 Non-pressure chronic ulcer of other part of right foot with fat layer exposed I25.10  Atherosclerotic heart disease of native coronary artery without angina pectoris I48.0 Paroxysmal atrial fibrillation Quantity: 1 Electronic Signature(s) Signed: 07/20/2023 5:37:17 PM By: Allen Derry PA-C Previous Signature: 07/20/2023 4:28:47 PM Version By: Midge Aver MSN RN CNS WTA Entered By: Allen Derry on 07/20/2023 14:37:16

## 2023-08-01 ENCOUNTER — Encounter: Payer: PPO | Attending: Physician Assistant | Admitting: Physician Assistant

## 2023-08-01 DIAGNOSIS — I2510 Atherosclerotic heart disease of native coronary artery without angina pectoris: Secondary | ICD-10-CM | POA: Diagnosis not present

## 2023-08-01 DIAGNOSIS — I509 Heart failure, unspecified: Secondary | ICD-10-CM | POA: Diagnosis not present

## 2023-08-01 DIAGNOSIS — E1140 Type 2 diabetes mellitus with diabetic neuropathy, unspecified: Secondary | ICD-10-CM | POA: Diagnosis not present

## 2023-08-01 DIAGNOSIS — I480 Paroxysmal atrial fibrillation: Secondary | ICD-10-CM | POA: Diagnosis not present

## 2023-08-01 DIAGNOSIS — L97512 Non-pressure chronic ulcer of other part of right foot with fat layer exposed: Secondary | ICD-10-CM | POA: Insufficient documentation

## 2023-08-01 DIAGNOSIS — E1151 Type 2 diabetes mellitus with diabetic peripheral angiopathy without gangrene: Secondary | ICD-10-CM | POA: Insufficient documentation

## 2023-08-01 DIAGNOSIS — Z7901 Long term (current) use of anticoagulants: Secondary | ICD-10-CM | POA: Insufficient documentation

## 2023-08-01 DIAGNOSIS — E11621 Type 2 diabetes mellitus with foot ulcer: Secondary | ICD-10-CM | POA: Insufficient documentation

## 2023-08-01 DIAGNOSIS — I110 Hypertensive heart disease with heart failure: Secondary | ICD-10-CM | POA: Diagnosis not present

## 2023-08-01 DIAGNOSIS — L97412 Non-pressure chronic ulcer of right heel and midfoot with fat layer exposed: Secondary | ICD-10-CM | POA: Diagnosis not present

## 2023-08-01 NOTE — Progress Notes (Signed)
[name] ([id number]) [id number] Page 1 of 12 Visit Report for 08/01/2023 Chief Complaint Document Details Patient Name: Date of Service: [name] 08/01/2023 3:45 PM Medical Record Number: [id number] Patient Account Number: [id number] Date of Birth/Sex: Treating RN: [birth year] (72 y.o. [name] Primary Care Provider: Kerby Nora Other Clinician: Betha Loa Referring Provider: Treating Provider/Extender: [name], [name] in Treatment: 26 Information Obtained from: Patient Chief Complaint Right foot ulcers Electronic Signature(s) Signed: 08/01/2023 3:54:04 PM By: Allen Derry PA-C Entered By: Allen Derry on 08/01/2023 12:54:02 -------------------------------------------------------------------------------- Debridement Details Patient Name: Date of Service: William Albright HN H. 08/01/2023 3:45 PM Medical Record Number: [id number] Patient Account Number: [id number] Date of Birth/Sex: Treating RN: 05-Mar-1951 (72 y.o. [name] Primary Care Provider: Kerby Nora Other Clinician: Betha Loa Referring Provider: Treating Provider/Extender: [name], [name] in Treatment: 26 Debridement Performed for Assessment: Wound #5 Right T Third oe Performed By: Physician Allen Derry, PA-C Debridement Type: Debridement Severity of Tissue Pre Debridement: Fat layer exposed Level of Consciousness (Pre-procedure): Awake and Alert Pre-procedure Verification/Time Out Yes - 16:02 Taken: Start Time: 16:02 Percent of Wound Bed Debrided: 100% T Area Debrided (cm): otal 0.38 Tissue and other material debrided: Viable, Non-Viable, Slough, Subcutaneous, Slough Level: Skin/Subcutaneous Tissue Debridement Description: Excisional Instrument: Curette Bleeding: Minimum Hemostasis Achieved: Pressure Response to Treatment: Procedure was tolerated well Level of Consciousness (Post- Awake and Alert procedure): William, [name]  ([id number]) [id number] Page 2 of 12 Post Debridement Measurements of Total Wound Length: (cm) 0.7 Width: (cm) 0.7 Depth: (cm) 0.1 Volume: (cm) 0.038 Character of Wound/Ulcer Post Debridement: Stable Severity of Tissue Post Debridement: Fat layer exposed Post Procedure Diagnosis Same as Pre-procedure Electronic Signature(s) Signed: 08/01/2023 4:55:35 PM By: Betha Loa Signed: 08/02/2023 11:59:16 AM By: Midge Aver MSN RN CNS WTA Signed: 08/03/2023 7:39:18 AM By: Allen Derry PA-C Entered By: Betha Loa on 08/01/2023 13:03:24 -------------------------------------------------------------------------------- Debridement Details Patient Name: Date of Service: William Albright HN H. 08/01/2023 3:45 PM Medical Record Number: [id number] Patient Account Number: [id number] Date of Birth/Sex: Treating RN: [birth year] (72 y.o. [name] Primary Care Provider: Kerby Nora Other Clinician: Betha Loa Referring Provider: Treating Provider/Extender: [name], [name] in Treatment: 26 Debridement Performed for Assessment: Wound #3 Right,Proximal,Dorsal Foot Performed By: Physician Allen Derry, PA-C Debridement Type: Debridement Severity of Tissue Pre Debridement: Fat layer exposed Level of Consciousness (Pre-procedure): Awake and Alert Pre-procedure Verification/Time Out Yes - 16:02 Taken: Start Time: 16:02 Percent of Wound Bed Debrided: 100% T Area Debrided (cm): otal 0.16 Tissue and other material debrided: Viable, Non-Viable, Skin: Dermis , Skin: Epidermis Level: Skin/Epidermis Debridement Description: Selective/Open Wound Instrument: Curette Bleeding: Minimum Hemostasis Achieved: Pressure Response to Treatment: Procedure was tolerated well Level of Consciousness (Post- Awake and Alert procedure): Post Debridement Measurements of Total Wound Length: (cm) 0.4 Width: (cm) 0.5 Depth: (cm) 0.1 Volume: (cm) 0.016 Character of  Wound/Ulcer Post Debridement: Stable Severity of Tissue Post Debridement: Fat layer exposed Post Procedure Diagnosis Same as Pre-procedure Electronic Signature(s) Signed: 08/01/2023 4:55:35 PM By: Betha Loa Signed: 08/02/2023 11:59:16 AM By: Midge Aver MSN RN CNS [name] [name] ([id number]) [id number] Page 3 of 12 Signed: 08/03/2023 7:39:18 AM By: Allen Derry PA-C Entered By: Betha Loa on 08/01/2023 13:04:19 -------------------------------------------------------------------------------- Debridement Details Patient Name: Date of Service: William Albright HN H. 08/01/2023 3:45 PM Medical Record Number: [id number] Patient Account Number: [id number] Date of Birth/Sex: Treating RN: 16-Jul-1951 (72 y.o. [name] Primary Care Provider:  Ermalene Searing, Amy Other Clinician: Betha Loa Referring Provider: Treating Provider/Extender: [name], [name] in Treatment: 26 Debridement Performed for Assessment: Wound #1R Right Calcaneus Performed By: Physician Allen Derry, PA-C Debridement Type: Debridement Severity of Tissue Pre Debridement: Fat layer exposed Level of Consciousness (Pre-procedure): Awake and Alert Pre-procedure Verification/Time Out Yes - 16:04 Taken: Start Time: 16:04 Percent of Wound Bed Debrided: 100% T Area Debrided (cm): otal 0.24 Tissue and other material debrided: Viable, Non-Viable, Callus, Biofilm Level: Non-Viable Tissue Debridement Description: Selective/Open Wound Instrument: Curette Bleeding: Minimum Hemostasis Achieved: Pressure Response to Treatment: Procedure was tolerated well Level of Consciousness (Post- Awake and Alert procedure): Post Debridement Measurements of Total Wound Length: (cm) 0.6 Width: (cm) 0.5 Depth: (cm) 0.1 Volume: (cm) 0.024 Character of Wound/Ulcer Post Debridement: Stable Severity of Tissue Post Debridement: Fat layer exposed Post Procedure Diagnosis Same as  Pre-procedure Electronic Signature(s) Signed: 08/01/2023 4:55:35 PM By: Betha Loa Signed: 08/02/2023 11:59:16 AM By: Midge Aver MSN RN CNS WTA Signed: 08/03/2023 7:39:18 AM By: Allen Derry PA-C Entered By: Betha Loa on 08/01/2023 13:05:18 [name] ([id number]) [id number] Page 4 of 12 -------------------------------------------------------------------------------- HPI Details Patient Name: Date of Service: [name] 08/01/2023 3:45 PM Medical Record Number: [id number] Patient Account Number: [id number] Date of Birth/Sex: Treating RN: 03/19/1951 (72 y.o. [name] Primary Care Provider: Kerby Nora Other Clinician: Betha Loa Referring Provider: Treating Provider/Extender: [name], [name] in Treatment: 26 History of Present Illness HPI Description: 01-31-2023 upon evaluation today patient appears to be doing poorly currently in regard to wounds on his right heel, right dorsal foot, and right lateral foot. Subsequently he does have known peripheral vascular disease and again this is something that he needs to I think Checked formally. This is what the majority of the conversation today hinged around and the patient voiced understanding as far as that is concerned. Fortunately I do not see any signs of active infection locally nor systemically which is great news. Patient does have a history of diabetes mellitus type 2, atrial fibrillation for which she is on long-term anticoagulant Therapy, hypertension, and coronary artery disease. Patient's hemoglobin A1c most recently was on 11-17-2022 and was 7.2 and currently he is on Eliquis and Plavix. 02-07-2023 upon evaluation today patient appears to be doing well currently in regard to his wounds all things considered I feel like we are still maintaining that does not seem like it is any worse is also not significantly better. I discussed with the patient that I do believe he  would benefit from the arterial evaluation we still need to get this done as quickly as possible and subsequently we did put in a follow-up call with the vascular office today with regard to this. 02-14-2023 upon evaluation today patient appears to be doing a little better in regard to his wounds which are showing signs of loosening which is great news. With that being said he is experiencing an improvement overall in his symptoms and we are still waiting on the vascular evaluation want to get this result and consider whether or not his blood flow is good or not we will be able to make a better determination of next steps. For now we will try and avoid any aggressive sharp debridement to know that he has good arterial flow. 02-21-2023 upon evaluation today patient appears to be doing about the same in regard to his wound. Fortunately there does not appear to be any signs of active infection at this time  which is great news. No fevers, chills, nausea, vomiting, or diarrhea. I did review patient's arterial study and it appears that he has pretty good flow in the left is not perfect but it is decent. On the right however he is definitely not doing nearly as good and I think that he is going to need to see one of the vascular doctors for further evaluation and treatment of this right leg in order to get these wounds to heal. 02-28-2023 upon evaluation today patient appears to be doing well currently in regard to his wound. He has been tolerating the dressing changes without complication. Fortunately I do not see any evidence of active infection locally nor systemically at this time. I do think that the eschar started to soften up and I would like to try to get some of this off to see if we can get things moving in the right direction. He is in agreement with this plan. 03-09-2023 upon evaluation today patient appears to be doing well currently in regard to his wound. He has been tolerating the dressing changes  without complication. Fortunately there does not appear to be any signs of active infection locally nor systemically at this time which is great news. I do believe clearing out some of the necrotic debris last week has helped to a degree. 03-16-2023 upon evaluation today patient appears to be doing well currently in regard to his wounds. He is going to be having a vascular angiogram in order to open up blood flow and I think this is going to be very beneficial for him. Fortunately I do not see any evidence of active infection locally nor systemically which is great news. 7/25; patient with predominantly ischemic wounds to on his dorsal foot and 1 on the right heel. He is going for his angiogram on Monday. He has been using the Iodoflex to the wound and changing this dressing himself. 04-11-2023 upon evaluation today patient appears to be doing well currently in regard to his wounds. In fact he had the arterial procedure with vascular and the great news is he actually has much improved blood flow he had a 95% blockage of the popliteal as well as the posterior tibial based on the record review. Post procedure he now has a residual of only about 30% in the popliteal and closer around 10% in the posterior tibial. The blood flow is greatly improved and it is obvious based on what we are seeing. 04-20-2023 upon evaluation today patient appears to be doing well currently in regard to his wounds. They are actually showing signs of excellent improvement and to be honest now that he is got good arterial flow going he is really healing quite nicely. Fortunately I do not see any signs of active infection locally or systemically which is great news. 8/29 this is a patient who has known arterial insufficiency. She has 2 wounds on the dorsal right foot and 1 on the back of the heel. She had an angiogram at the end of July and had follow-up arterial studies yesterday but I cannot pull up the arterial studies. We are  using Prisma and Zituvimet and really doing quite well. The wounds are better 05-08-2023 upon evaluation today patient appears to be doing well currently in regard to his ulcers. He has been tolerating the dressing changes without complication. Fortunately there does not appear to be any signs of active infection looking or systemically at this time. 05-29-2023 upon evaluation today patient appears to be doing  well currently in regard to his wounds are all pretty healthy appearing. With that being said unfortunately he is having some issues here with to the wounds that were previously pretty much closed having reopened. Either way I think that the wounds are healthy appearing. 06-06-2023 upon evaluation today patient's wounds appear to be doing well though the areas actually appear to be a little bit irritated due to what appears to be drainage from the Band-Aids. I think we need to go back to doing the Zetuvit and wrapping as opposed to using the Band-Aids. 06-13-2023 upon evaluation today patient appears to be doing excellent in regard to all 3 wound locations. He has been tolerating the dressing changes without complication and in general I believe that he is making excellent headway towards closure which is great news. No fevers, chills, nausea, vomiting, or diarrhea. 06-22-2023 upon evaluation today patient actually appears to be making good headway towards closure I am actually very pleased with where we stand today. I do not see any signs of active infection locally or systemically which is great news and in general I do think that we are making good headway towards complete closure which is great news. 06-30-2023 upon evaluation today patient appears to be doing well currently in regard to his wounds they are not getting quite a small sound like to see they are getting a little smaller here and there at set for the 1 on the distal foot right side which actually showing signs of plateauing. Working  to see how things continue to move but if it does not significantly improved by next week I may consider looking into Apligraf for him as a possibility. The patient is in agreement with that plan. 07-11-2023 upon evaluation today patient appears to be doing okay currently in regard to his wounds although the wound on the top of his foot approximately is actually larger I think is because is rubbing on his shoe. I believe that he may do better to switch out for different shoes. I recommended that tennis shoes in particular would probably be the best way to go. JUSTINO, MASTERMAN ([id number]) [id number] Page 5 of 12 07-20-2023 upon evaluation today patient appears to be doing well currently in regard to the wounds on his right foot. This is actually the best that have seen them in quite some time. Fortunately I do not see any evidence of worsening at this point with regard to the wounds and I think that he is making good headway here towards closure. 08-01-2023 upon evaluation today patient appears to be doing well currently in regard to his wound. He has been tolerating the dressing changes without complication. His wounds in general seem to be doing better although he does have some areas that are showing signs of still having some issue with healing that he has a new wound on the toe that he did have previous. Electronic Signature(s) Signed: 08/01/2023 4:14:30 PM By: Allen Derry PA-C Entered By: Allen Derry on 08/01/2023 13:14:24 -------------------------------------------------------------------------------- Physical Exam Details Patient Name: Date of Service: [name] 08/01/2023 3:45 PM Medical Record Number: [id number] Patient Account Number: [id number] Date of Birth/Sex: Treating RN: [birth year] (72 y.o. [name] Primary Care Provider: Kerby Nora Other Clinician: Betha Loa Referring Provider: Treating Provider/Extender: [name],  [name] in Treatment: 18 Constitutional Well-nourished and well-hydrated in no acute distress. Respiratory normal breathing without difficulty. Psychiatric this patient is able to make decisions and demonstrates good insight into disease  process. Alert and Oriented x 3. pleasant and cooperative. Notes Upon inspection patient's wound bed actually shows to be doing a lot better I did perform debridement of necessary areas that he tolerated that today without complication postdebridement the wound bed appears to be doing much better which is great news. Electronic Signature(s) Signed: 08/01/2023 4:15:06 PM By: Allen Derry PA-C Entered By: Allen Derry on 08/01/2023 13:14:53 -------------------------------------------------------------------------------- Physician Orders Details Patient Name: Date of Service: William Albright HN H. 08/01/2023 3:45 PM Medical Record Number: [id number] Patient Account Number: [id number] Date of Birth/Sex: Treating RN: 1950/11/22 (72 y.o. [name] Primary Care Provider: Kerby Nora Other Clinician: Betha Loa Referring Provider: Treating Provider/Extender: [name], [name] in Treatment: 26 The following information was scribed by: Aydn, Bolivar ([id number]) [id number] Page 6 of 12 The information was scribed for: Allen Derry Verbal / Phone Orders: No Diagnosis Coding ICD-10 Coding Code Description E11.621 Type 2 diabetes mellitus with foot ulcer L97.512 Non-pressure chronic ulcer of other part of right foot with fat layer exposed I25.10 Atherosclerotic heart disease of native coronary artery without angina pectoris I48.0 Paroxysmal atrial fibrillation Z79.01 Long term (current) use of anticoagulants I10 Essential (primary) hypertension Follow-up Appointments Return Appointment in 1 week. Bathing/ [employer] wounds with antibacterial soap and water. Anesthetic (Use 'Patient  Medications' Section for Anesthetic Order Entry) Lidocaine applied to wound bed Edema Control - Orders / Instructions Elevate, Exercise Daily and A void Standing for Long Periods of Time. Elevate legs to the level of the heart and pump ankles as often as possible Elevate leg(s) parallel to the floor when sitting. Wound Treatment Wound #1R - Calcaneus Wound Laterality: Right Cleanser: Byram Ancillary Kit - 15 Day Supply (DME) (Generic) 3 x Per Week/30 Days Discharge Instructions: Use supplies as instructed; Kit contains: (15) Saline Bullets; (15) 3x3 Gauze; 15 pr Gloves Cleanser: Soap and Water 3 x Per Week/30 Days Discharge Instructions: Gently cleanse wound with antibacterial soap, rinse and pat dry prior to dressing wounds Prim Dressing: Hydrofera Blue Ready Transfer Foam, 2.5x2.5 (in/in) (DME) (Dispense As Written) 3 x Per Week/30 Days ary Discharge Instructions: Apply Hydrofera Blue Ready to wound bed as directed Secondary Dressing: (BORDER) Zetuvit Plus SILICONE BORDER Dressing 4x4 (in/in) (DME) (Dispense As Written) 3 x Per Week/30 Days Discharge Instructions: Please do not put silicone bordered dressings under wraps. Use non-bordered dressing only. Wound #2R - Foot Wound Laterality: Dorsal, Right, Distal Cleanser: Byram Ancillary Kit - 15 Day Supply (DME) (Generic) 3 x Per Week/30 Days Discharge Instructions: Use supplies as instructed; Kit contains: (15) Saline Bullets; (15) 3x3 Gauze; 15 pr Gloves Cleanser: Soap and Water 3 x Per Week/30 Days Discharge Instructions: Gently cleanse wound with antibacterial soap, rinse and pat dry prior to dressing wounds Prim Dressing: Hydrofera Blue Ready Transfer Foam, 2.5x2.5 (in/in) (DME) (Dispense As Written) 3 x Per Week/30 Days ary Discharge Instructions: Apply Hydrofera Blue Ready to wound bed as directed Secondary Dressing: (BORDER) Zetuvit Plus SILICONE BORDER Dressing 4x4 (in/in) (DME) (Dispense As Written) 3 x Per Week/30 Days Discharge  Instructions: Please do not put silicone bordered dressings under wraps. Use non-bordered dressing only. Wound #3 - Foot Wound Laterality: Dorsal, Right, Proximal Cleanser: Byram Ancillary Kit - 15 Day Supply (DME) (Generic) 3 x Per Week/30 Days Discharge Instructions: Use supplies as instructed; Kit contains: (15) Saline Bullets; (15) 3x3 Gauze; 15 pr Gloves Cleanser: Soap and Water 3 x Per Week/30 Days Discharge Instructions: Gently cleanse wound with antibacterial soap,  rinse and pat dry prior to dressing wounds Prim Dressing: Hydrofera Blue Ready Transfer Foam, 2.5x2.5 (in/in) (DME) (Dispense As Written) 3 x Per Week/30 Days ary Discharge Instructions: Apply Hydrofera Blue Ready to wound bed as directed Secondary Dressing: (BORDER) Zetuvit Plus SILICONE BORDER Dressing 4x4 (in/in) (DME) (Dispense As Written) 3 x Per Week/30 Days Discharge Instructions: Please do not put silicone bordered dressings under wraps. Use non-bordered dressing only. Wound #5 - T Third oe Wound Laterality: Right Cleanser: Byram Ancillary Kit - 15 Day Supply (DME) (Dispense As Written) 3 x Per Week/30 Days Discharge Instructions: Use supplies as instructed; Kit contains: (15) Saline Bullets; (15) 3x3 Gauze; 15 pr Gloves Cleanser: Soap and Water 3 x Per Week/30 Days Discharge Instructions: Gently cleanse wound with antibacterial soap, rinse and pat dry prior to dressing wounds CARMI, OSTERKAMP ([id number]) [id number] Page 7 of 12 Prim Dressing: Hydrofera Blue Ready Transfer Foam, 2.5x2.5 (in/in) (DME) (Dispense As Written) 3 x Per Week/30 Days ary Discharge Instructions: Apply Hydrofera Blue Ready to wound bed as directed Secondary Dressing: (BORDER) Zetuvit Plus SILICONE BORDER Dressing 4x4 (in/in) (DME) (Dispense As Written) 3 x Per Week/30 Days Discharge Instructions: Please do not put silicone bordered dressings under wraps. Use non-bordered dressing only. Electronic  Signature(s) Signed: 08/01/2023 4:55:35 PM By: Betha Loa Signed: 08/03/2023 7:39:18 AM By: Allen Derry PA-C Entered By: Betha Loa on 08/01/2023 13:35:06 -------------------------------------------------------------------------------- Problem List Details Patient Name: Date of Service: William Albright HN H. 08/01/2023 3:45 PM Medical Record Number: [id number] Patient Account Number: [id number] Date of Birth/Sex: Treating RN: 1950/09/14 (72 y.o. [name] Primary Care Provider: Kerby Nora Other Clinician: Betha Loa Referring Provider: Treating Provider/Extender: [name], [name] in Treatment: 26 Active Problems ICD-10 Encounter Code Description Active Date MDM Diagnosis E11.621 Type 2 diabetes mellitus with foot ulcer 01/31/2023 No Yes L97.512 Non-pressure chronic ulcer of other part of right foot with fat layer exposed 01/31/2023 No Yes I25.10 Atherosclerotic heart disease of native coronary artery without angina pectoris 01/31/2023 No Yes I48.0 Paroxysmal atrial fibrillation 01/31/2023 No Yes Z79.01 Long term (current) use of anticoagulants 01/31/2023 No Yes I10 Essential (primary) hypertension 01/31/2023 No Yes Inactive Problems Resolved Problems Electronic Signature(s) Signed: 08/01/2023 3:53:59 PM By: Allen Derry PA-C Entered By: Allen Derry on 08/01/2023 12:53:57 Sattler, [name] ([id number]) [id number] Page 8 of 12 -------------------------------------------------------------------------------- Progress Note Details Patient Name: Date of Service: [name] 08/01/2023 3:45 PM Medical Record Number: [id number] Patient Account Number: [id number] Date of Birth/Sex: Treating RN: December 15, 1950 (72 y.o. [name] Primary Care Provider: Kerby Nora Other Clinician: Betha Loa Referring Provider: Treating Provider/Extender: [name], [name] in Treatment: 26 Subjective Chief Complaint Information obtained  from Patient Right foot ulcers History of Present Illness (HPI) 01-31-2023 upon evaluation today patient appears to be doing poorly currently in regard to wounds on his right heel, right dorsal foot, and right lateral foot. Subsequently he does have known peripheral vascular disease and again this is something that he needs to I think Checked formally. This is what the majority of the conversation today hinged around and the patient voiced understanding as far as that is concerned. Fortunately I do not see any signs of active infection locally nor systemically which is great news. Patient does have a history of diabetes mellitus type 2, atrial fibrillation for which she is on long-term anticoagulant Therapy, hypertension, and coronary artery disease. Patient's hemoglobin A1c most recently was on 11-17-2022 and was 7.2 and currently he is  on Eliquis and Plavix. 02-07-2023 upon evaluation today patient appears to be doing well currently in regard to his wounds all things considered I feel like we are still maintaining that does not seem like it is any worse is also not significantly better. I discussed with the patient that I do believe he would benefit from the arterial evaluation we still need to get this done as quickly as possible and subsequently we did put in a follow-up call with the vascular office today with regard to this. 02-14-2023 upon evaluation today patient appears to be doing a little better in regard to his wounds which are showing signs of loosening which is great news. With that being said he is experiencing an improvement overall in his symptoms and we are still waiting on the vascular evaluation want to get this result and consider whether or not his blood flow is good or not we will be able to make a better determination of next steps. For now we will try and avoid any aggressive sharp debridement to know that he has good arterial flow. 02-21-2023 upon evaluation today patient appears  to be doing about the same in regard to his wound. Fortunately there does not appear to be any signs of active infection at this time which is great news. No fevers, chills, nausea, vomiting, or diarrhea. I did review patient's arterial study and it appears that he has pretty good flow in the left is not perfect but it is decent. On the right however he is definitely not doing nearly as good and I think that he is going to need to see one of the vascular doctors for further evaluation and treatment of this right leg in order to get these wounds to heal. 02-28-2023 upon evaluation today patient appears to be doing well currently in regard to his wound. He has been tolerating the dressing changes without complication. Fortunately I do not see any evidence of active infection locally nor systemically at this time. I do think that the eschar started to soften up and I would like to try to get some of this off to see if we can get things moving in the right direction. He is in agreement with this plan. 03-09-2023 upon evaluation today patient appears to be doing well currently in regard to his wound. He has been tolerating the dressing changes without complication. Fortunately there does not appear to be any signs of active infection locally nor systemically at this time which is great news. I do believe clearing out some of the necrotic debris last week has helped to a degree. 03-16-2023 upon evaluation today patient appears to be doing well currently in regard to his wounds. He is going to be having a vascular angiogram in order to open up blood flow and I think this is going to be very beneficial for him. Fortunately I do not see any evidence of active infection locally nor systemically which is great news. 7/25; patient with predominantly ischemic wounds to on his dorsal foot and 1 on the right heel. He is going for his angiogram on Monday. He has been using the Iodoflex to the wound and changing this  dressing himself. 04-11-2023 upon evaluation today patient appears to be doing well currently in regard to his wounds. In fact he had the arterial procedure with vascular and the great news is he actually has much improved blood flow he had a 95% blockage of the popliteal as well as the posterior tibial based on  the record review. Post procedure he now has a residual of only about 30% in the popliteal and closer around 10% in the posterior tibial. The blood flow is greatly improved and it is obvious based on what we are seeing. 04-20-2023 upon evaluation today patient appears to be doing well currently in regard to his wounds. They are actually showing signs of excellent improvement and to be honest now that he is got good arterial flow going he is really healing quite nicely. Fortunately I do not see any signs of active infection locally or systemically which is great news. 8/29 this is a patient who has known arterial insufficiency. She has 2 wounds on the dorsal right foot and 1 on the back of the heel. She had an angiogram at the end of July and had follow-up arterial studies yesterday but I cannot pull up the arterial studies. We are using Prisma and Zituvimet and really doing quite well. The wounds are better 05-08-2023 upon evaluation today patient appears to be doing well currently in regard to his ulcers. He has been tolerating the dressing changes without complication. Fortunately there does not appear to be any signs of active infection looking or systemically at this time. KYLLE, FOSKETT ([id number]) [id number] Page 9 of 12 05-29-2023 upon evaluation today patient appears to be doing well currently in regard to his wounds are all pretty healthy appearing. With that being said unfortunately he is having some issues here with to the wounds that were previously pretty much closed having reopened. Either way I think that the wounds are healthy appearing. 06-06-2023 upon  evaluation today patient's wounds appear to be doing well though the areas actually appear to be a little bit irritated due to what appears to be drainage from the Band-Aids. I think we need to go back to doing the Zetuvit and wrapping as opposed to using the Band-Aids. 06-13-2023 upon evaluation today patient appears to be doing excellent in regard to all 3 wound locations. He has been tolerating the dressing changes without complication and in general I believe that he is making excellent headway towards closure which is great news. No fevers, chills, nausea, vomiting, or diarrhea. 06-22-2023 upon evaluation today patient actually appears to be making good headway towards closure I am actually very pleased with where we stand today. I do not see any signs of active infection locally or systemically which is great news and in general I do think that we are making good headway towards complete closure which is great news. 06-30-2023 upon evaluation today patient appears to be doing well currently in regard to his wounds they are not getting quite a small sound like to see they are getting a little smaller here and there at set for the 1 on the distal foot right side which actually showing signs of plateauing. Working to see how things continue to move but if it does not significantly improved by next week I may consider looking into Apligraf for him as a possibility. The patient is in agreement with that plan. 07-11-2023 upon evaluation today patient appears to be doing okay currently in regard to his wounds although the wound on the top of his foot approximately is actually larger I think is because is rubbing on his shoe. I believe that he may do better to switch out for different shoes. I recommended that tennis shoes in particular would probably be the best way to go. 07-20-2023 upon evaluation today patient appears to be doing well currently  in regard to the wounds on his right foot. This is  actually the best that have seen them in quite some time. Fortunately I do not see any evidence of worsening at this point with regard to the wounds and I think that he is making good headway here towards closure. 08-01-2023 upon evaluation today patient appears to be doing well currently in regard to his wound. He has been tolerating the dressing changes without complication. His wounds in general seem to be doing better although he does have some areas that are showing signs of still having some issue with healing that he has a new wound on the toe that he did have previous. Objective Constitutional Well-nourished and well-hydrated in no acute distress. Vitals Time Taken: 3:35 PM, Height: 66 in, Weight: 250 lbs, BMI: 40.3, Temperature: 98.2 F, Pulse: 85 bpm, Respiratory Rate: 18 breaths/min, Blood Pressure: 144/74 mmHg. Respiratory normal breathing without difficulty. Psychiatric this patient is able to make decisions and demonstrates good insight into disease process. Alert and Oriented x 3. pleasant and cooperative. General Notes: Upon inspection patient's wound bed actually shows to be doing a lot better I did perform debridement of necessary areas that he tolerated that today without complication postdebridement the wound bed appears to be doing much better which is great news. Integumentary (Hair, Skin) Wound #1R status is Open. Original cause of wound was Gradually Appeared. The date acquired was: 12/10/2022. The wound has been in treatment 26 weeks. The wound is located on the Right Calcaneus. The wound measures 0.6cm length x 0.5cm width x 0.1cm depth; 0.236cm^2 area and 0.024cm^3 volume. There is Fat Layer (Subcutaneous Tissue) exposed. There is a medium amount of serosanguineous drainage noted. There is large (67-100%) red granulation within the wound bed. There is no necrotic tissue within the wound bed. Wound #2R status is Open. Original cause of wound was Gradually Appeared. The  date acquired was: 12/10/2022. The wound has been in treatment 26 weeks. The wound is located on the Right,Distal,Dorsal Foot. The wound measures 0.3cm length x 0.6cm width x 0.1cm depth; 0.141cm^2 area and 0.014cm^3 volume. There is Fat Layer (Subcutaneous Tissue) exposed. There is a medium amount of serosanguineous drainage noted. There is large (67-100%) red, pink granulation within the wound bed. There is no necrotic tissue within the wound bed. Wound #3 status is Open. Original cause of wound was Gradually Appeared. The date acquired was: 12/10/2022. The wound has been in treatment 26 weeks. The wound is located on the Right,Proximal,Dorsal Foot. The wound measures 0.4cm length x 0.5cm width x 0.1cm depth; 0.157cm^2 area and 0.016cm^3 volume. There is Fat Layer (Subcutaneous Tissue) exposed. There is a medium amount of serosanguineous drainage noted. There is medium (34-66%) red granulation within the wound bed. There is a medium (34-66%) amount of necrotic tissue within the wound bed including Adherent Slough. Wound #5 status is Open. Original cause of wound was Blister. The date acquired was: 08/01/2023. The wound is located on the Right T Third. The wound oe measures 0.7cm length x 0.7cm width x 0.1cm depth; 0.385cm^2 area and 0.038cm^3 volume. There is Fat Layer (Subcutaneous Tissue) exposed. There is no tunneling or undermining noted. There is a medium amount of serosanguineous drainage noted. There is medium (34-66%) pink granulation within the wound bed. There is a medium (34-66%) amount of necrotic tissue within the wound bed including Adherent Slough. 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 There was a Selective/Open Wound Non-Viable Tissue Debridement with a total area of 0.24 sq cm performed by Allen Derry, PA-C. With the following instrument(s): Curette to remove Viable and Non-Viable tissue/material. Material removed includes Callus and Biofilm and. A time out was conducted at 16:04, prior to the start of the procedure. A Minimum amount of bleeding was controlled with Pressure. The procedure was tolerated well. Post Debridement Measurements: 0.6cm length x 0.5cm width x 0.1cm depth; 0.024cm^3 volume. Character of Wound/Ulcer Post Debridement is stable. Severity of Tissue Post Debridement is: Fat layer exposed. Post procedure Diagnosis Wound #1R: Same as Pre-Procedure Wound #3 Pre-procedure diagnosis of Wound #3 is a Diabetic Wound/Ulcer of the Lower Extremity located on the Right,Proximal,Dorsal Foot .Severity of Tissue Pre Debridement is: Fat layer exposed. There was a Selective/Open Wound Skin/Epidermis Debridement with a total area of 0.16 sq cm performed by Allen Derry, PA-C. With the following instrument(s): Curette to remove Viable and Non-Viable tissue/material. Material removed includes Skin: Dermis and Skin: Epidermis and. A time out was conducted at 16:02, prior to the start of the procedure. A Minimum amount of bleeding was controlled with Pressure. The procedure was tolerated well. Post Debridement Measurements: 0.4cm length x 0.5cm width x 0.1cm depth; 0.016cm^3 volume. Character of Wound/Ulcer Post Debridement is stable. Severity of Tissue Post Debridement is: Fat layer exposed. Post procedure Diagnosis Wound #3: Same as Pre-Procedure Wound #5 Pre-procedure diagnosis of Wound #5 is a  Diabetic Wound/Ulcer of the Lower Extremity located on the Right T Third .Severity of Tissue Pre Debridement is: oe Fat layer exposed. There was a Excisional Skin/Subcutaneous Tissue Debridement with a total area of 0.38 sq cm performed by Allen Derry, PA-C. With the following instrument(s): Curette to remove Viable and Non-Viable tissue/material. Material removed includes Subcutaneous Tissue and Slough and. A time out was conducted at 16:02, prior to the start of the procedure. A Minimum amount of bleeding was controlled with Pressure. The procedure was tolerated well. Post Debridement Measurements: 0.7cm length x 0.7cm width x 0.1cm depth; 0.038cm^3 volume. Character of Wound/Ulcer Post Debridement is stable. Severity of Tissue Post Debridement is: Fat layer exposed. Post procedure Diagnosis Wound #5: Same as Pre-Procedure Plan Follow-up Appointments: Return Appointment in 1 week. Bathing/ Shower/ Hygiene: Wash wounds with antibacterial soap and water. Anesthetic (Use 'Patient Medications' Section for Anesthetic Order Entry): Lidocaine applied to wound bed Edema Control - Orders / Instructions: Elevate, Exercise Daily and Avoid Standing for Long Periods of Time. Elevate legs to the level of the heart and pump ankles as often as possible Elevate leg(s) parallel to the floor when sitting. WOUND #1R: - Calcaneus Wound Laterality: Right Cleanser: Byram Ancillary Kit - 15 Day Supply (DME) (Generic) 3 x Per Week/30 Days Discharge Instructions: Use supplies as instructed; Kit contains: (15) Saline Bullets; (15) 3x3 Gauze; 15 pr Gloves Cleanser: Soap and Water 3 x Per Week/30 Days Discharge Instructions: Gently cleanse wound with antibacterial soap, rinse and pat dry prior to dressing wounds Prim Dressing: Hydrofera Blue Ready Transfer Foam, 2.5x2.5 (in/in) (DME) (Dispense As Written) 3 x Per Week/30 Days ary Discharge Instructions: Apply Hydrofera Blue Ready  to wound bed as directed Secondary  Dressing: (BORDER) Zetuvit Plus SILICONE BORDER Dressing 4x4 (in/in) (DME) (Dispense As Written) 3 x Per Week/30 Days Discharge Instructions: Please do not put silicone bordered dressings under wraps. Use non-bordered dressing only. WOUND #2R: - Foot Wound Laterality: Dorsal, Right, Distal Cleanser: Byram Ancillary Kit - 15 Day Supply (DME) (Generic) 3 x Per Week/30 Days Discharge Instructions: Use supplies as instructed; Kit contains: (15) Saline Bullets; (15) 3x3 Gauze; 15 pr Gloves Cleanser: Soap and Water 3 x Per Week/30 Days Discharge Instructions: Gently cleanse wound with antibacterial soap, rinse and pat dry prior to dressing wounds Prim Dressing: Hydrofera Blue Ready Transfer Foam, 2.5x2.5 (in/in) (DME) (Dispense As Written) 3 x Per Week/30 Days ary Discharge Instructions: Apply Hydrofera Blue Ready to wound bed as directed Secondary Dressing: (BORDER) Zetuvit Plus SILICONE BORDER Dressing 4x4 (in/in) (DME) (Dispense As Written) 3 x Per Week/30 Days Discharge Instructions: Please do not put silicone bordered dressings under wraps. Use non-bordered dressing only. WOUND #3: - Foot Wound Laterality: Dorsal, Right, Proximal Cleanser: Byram Ancillary Kit - 15 Day Supply (DME) (Generic) 3 x Per Week/30 Days Discharge Instructions: Use supplies as instructed; Kit contains: (15) Saline Bullets; (15) 3x3 Gauze; 15 pr Gloves Cleanser: Soap and Water 3 x Per Week/30 Days Discharge Instructions: Gently cleanse wound with antibacterial soap, rinse and pat dry prior to dressing wounds Prim Dressing: Hydrofera Blue Ready Transfer Foam, 2.5x2.5 (in/in) (DME) (Dispense As Written) 3 x Per Week/30 Days ary Discharge Instructions: Apply Hydrofera Blue Ready to wound bed as directed Secondary Dressing: (BORDER) Zetuvit Plus SILICONE BORDER Dressing 4x4 (in/in) (DME) (Dispense As Written) 3 x Per Week/30 Days Discharge Instructions: Please do not put silicone bordered dressings under wraps. Use non-bordered  dressing only. WOUND #5: - T Third Wound Laterality: Right oe Cleanser: Byram Ancillary Kit - 15 Day Supply (DME) (Dispense As Written) 3 x Per Week/30 Days Discharge Instructions: Use supplies as instructed; Kit contains: (15) Saline Bullets; (15) 3x3 Gauze; 15 pr Gloves Cleanser: Soap and Water 3 x Per Week/30 Days Discharge Instructions: Gently cleanse wound with antibacterial soap, rinse and pat dry prior to dressing wounds Prim Dressing: Hydrofera Blue Ready Transfer Foam, 2.5x2.5 (in/in) (DME) (Dispense As Written) 3 x Per Week/30 Days NISHAAN, KINNER ([id number]) [id number] Page 11 of 12 Discharge Instructions: Apply Hydrofera Blue Ready to wound bed as directed Secondary Dressing: (BORDER) Zetuvit Plus SILICONE BORDER Dressing 4x4 (in/in) (DME) (Dispense As Written) 3 x Per Week/30 Days Discharge Instructions: Please do not put silicone bordered dressings under wraps. Use non-bordered dressing only. 1. Based on what I am seeing I do believe that the patient is making good headway here towards closure which is great news and in general I think that we are moving in the right direction. I do not see any signs of active infection at this time. 2. I would recommend as well that going to continue to monitor for any signs of infection or worsening. Based on what I see I do believe that we are making good headway here towards closure which is great news. We will see patient back for reevaluation in 1 week here in the clinic. If anything worsens or changes patient will contact our office for additional recommendations. Electronic Signature(s) Signed: 08/01/2023 4:58:58 PM By: Allen Derry PA-C Entered By: Allen Derry on 08/01/2023 13:58:55 -------------------------------------------------------------------------------- SuperBill Details Patient Name: Date of Service: William Albright HN H. 08/01/2023 Medical Record Number: [id number] Patient Account Number:  [id number] Date of  Birth/Sex: Treating RN: 1951/02/09 (72 y.o. [name] Primary Care Provider: Kerby Nora Other Clinician: Betha Loa Referring Provider: Treating Provider/Extender: [name], [name] in Treatment: 26 Diagnosis Coding ICD-10 Codes Code Description E11.621 Type 2 diabetes mellitus with foot ulcer L97.512 Non-pressure chronic ulcer of other part of right foot with fat layer exposed I25.10 Atherosclerotic heart disease of native coronary artery without angina pectoris I48.0 Paroxysmal atrial fibrillation Z79.01 Long term (current) use of anticoagulants I10 Essential (primary) hypertension Facility Procedures : CPT4 Code: 29562130 Description: 11042 - DEB SUBQ TISSUE 20 SQ CM/< ICD-10 Diagnosis Description L97.512 Non-pressure chronic ulcer of other part of right foot with fat layer exposed Modifier: Quantity: 1 : CPT4 Code: 86578469 Description: 97597 - DEBRIDE WOUND 1ST 20 SQ CM OR < ICD-10 Diagnosis Description L97.512 Non-pressure chronic ulcer of other part of right foot with fat layer exposed Modifier: Quantity: 1 Physician Procedures : CPT4 Code Description Modifier 11042 11042 - WC PHYS SUBQ TISS 20 SQ CM ICD-10 Diagnosis Description L97.512 Non-pressure chronic ulcer of other part of right foot with fat layer exposed Quantity: 1 : [id number] 97597 - WC PHYS DEBR WO ANESTH 20 SQ CM ICD-10 Diagnosis Description SHELTON, KRELL ([id number]) [id number] Page 1 [phone] Non-pressure chronic ulcer of other part of right foot with fat layer exposed Quantity: 1 2 of 12 Electronic Signature(s) Signed: 08/01/2023 5:09:10 PM By: Allen Derry PA-C Entered By: Allen Derry on 08/01/2023 14:09:09

## 2023-08-02 ENCOUNTER — Other Ambulatory Visit: Payer: Self-pay | Admitting: Family Medicine

## 2023-08-02 DIAGNOSIS — E11621 Type 2 diabetes mellitus with foot ulcer: Secondary | ICD-10-CM | POA: Diagnosis not present

## 2023-08-02 NOTE — Progress Notes (Signed)
ISAAC, RADILLO ([id number]) [id number] Page 1 of 12 Visit Report for 08/01/2023 Arrival Information Details Patient Name: Date of Service: [name] 08/01/2023 3:45 PM Medical Record Number: [id number] Patient Account Number: [id number] Date of Birth/Sex: Treating RN: [birth year] (72 y.o. [name] Primary Care Bobie Kistler: Kerby Nora Other Clinician: Betha Loa Referring Sayed Apostol: Treating Shahan Starks/Extender: [name], [name] in Treatment: 26 Visit Information History Since Last Visit All ordered tests and consults were completed: No Patient Arrived: [name] Added or deleted any medications: No Arrival Time: 15:35 Any new allergies or adverse reactions: No Transfer Assistance: None Had a fall or experienced change in No Patient Identification Verified: Yes activities of daily living that may affect Secondary Verification Process Completed: Yes risk of falls: Patient Requires Transmission-Based Precautions: No Signs or symptoms of abuse/neglect since last visito No Patient Has Alerts: Yes Hospitalized since last visit: No Patient Alerts: Patient on Blood Thinner Implantable device outside of the clinic excluding No Diabetes type 2 cellular tissue based products placed in the center Eliquis/Plavix since last visit: ABI R 1.23 TBI 0.57 Has Dressing in Place as Prescribed: Yes ABI L 0.98 TBI 0.80 Pain Present Now: No Electronic Signature(s) Signed: 08/01/2023 4:55:35 PM By: Betha Loa Entered By: Betha Loa on 08/01/2023 12:35:26 -------------------------------------------------------------------------------- Clinic Level of Care Assessment Details Patient Name: Date of Service: [name] 08/01/2023 3:45 PM Medical Record Number: [id number] Patient Account Number: [id number] Date of Birth/Sex: Treating RN: [birth year] (72 y.o. [name] Primary Care Hayven Croy: Kerby Nora Other Clinician: Betha Loa Referring Emeric Novinger: Treating Rickita Forstner/Extender: [name], [name] in Treatment: 26 Clinic Level of Care Assessment Items TOOL 1 Quantity Score []  - 0 Use when EandM and Procedure is performed on INITIAL visit ASSESSMENTS - Nursing Assessment / Reassessment []  - 0 General Physical Exam (combine w/ comprehensive assessment (listed just below) when performed on new pt. evals) []  - 0 Comprehensive Assessment (HX, ROS, Risk Assessments, Wounds Hx, etc.) MADSEN, FROHN ([id number]) [id number] Page 2 of 12 ASSESSMENTS - Wound and Skin Assessment / Reassessment []  - 0 Dermatologic / Skin Assessment (not related to wound area) ASSESSMENTS - Ostomy and/or Continence Assessment and Care []  - 0 Incontinence Assessment and Management []  - 0 Ostomy Care Assessment and Management (repouching, etc.) PROCESS - Coordination of Care []  - 0 Simple Patient / Family Education for ongoing care []  - 0 Complex (extensive) Patient / Family Education for ongoing care []  - 0 Staff obtains Chiropractor, Records, T Results / Process Orders est []  - 0 Staff telephones HHA, Nursing Homes / Clarify orders / etc []  - 0 Routine Transfer to another Facility (non-emergent condition) []  - 0 Routine Hospital Admission (non-emergent condition) []  - 0 New Admissions / Manufacturing engineer / Ordering NPWT Apligraf, etc. , []  - 0 Emergency Hospital Admission (emergent condition) PROCESS - Special Needs []  - 0 Pediatric / Minor Patient Management []  - 0 Isolation Patient Management []  - 0 Hearing / Language / Visual special needs []  - 0 Assessment of Community assistance (transportation, D/C planning, etc.) []  - 0 Additional assistance / Altered mentation []  - 0 Support Surface(s) Assessment (bed, cushion, seat, etc.) INTERVENTIONS - Miscellaneous []  - 0 External ear exam []  - 0 Patient Transfer (multiple staff / Nurse, adult / Similar devices) []  -  0 Simple Staple / Suture removal (25 or less) []  - 0 Complex Staple / Suture removal (26 or more) []  - 0 Hypo/Hyperglycemic Management (do not check  if billed separately) []  - 0 Ankle / Brachial Index (ABI) - do not check if billed separately Has the patient been seen at the hospital within the last three years: Yes Total Score: 0 Level Of Care: ____ Electronic Signature(s) Signed: 08/01/2023 4:55:35 PM By: Betha Loa Entered By: Betha Loa on 08/01/2023 13:06:05 -------------------------------------------------------------------------------- Encounter Discharge Information Details Patient Name: Date of Service: William Albright HN H. 08/01/2023 3:45 PM Medical Record Number: [id number] Patient Account Number: [id number] Date of Birth/Sex: Treating RN: April 23, 1951 (72 y.o. [name] Primary Care Hana Trippett: Kerby Nora Other Clinician: Betha Loa Referring Jadyn Brasher: Treating Moo Gravley/Extender: Kunj, Safrit ([id number]) [id number] Page 3 of 12 Weeks in Treatment: 26 Encounter Discharge Information Items Post Procedure Vitals Discharge Condition: Stable Temperature (F): 98.2 Ambulatory Status: Cane Pulse (bpm): 85 Discharge Destination: Home Respiratory Rate (breaths/min): 18 Transportation: Private Auto Blood Pressure (mmHg): 144/74 Accompanied By: self Schedule Follow-up Appointment: Yes Clinical Summary of Care: Electronic Signature(s) Signed: 08/01/2023 4:55:35 PM By: Betha Loa Entered By: Betha Loa on 08/01/2023 13:40:07 -------------------------------------------------------------------------------- Lower Extremity Assessment Details Patient Name: Date of Service: [name] 08/01/2023 3:45 PM Medical Record Number: [id number] Patient Account Number: [id number] Date of Birth/Sex: Treating RN: [birth year] (72 y.o. [name] Primary Care Lawrnce Reyez: Kerby Nora Other Clinician: Betha Loa Referring Wadell Craddock: Treating Analyce Tavares/Extender: [name], [name] in Treatment: 26 Electronic Signature(s) Signed: 08/01/2023 4:55:35 PM By: Betha Loa Signed: 08/02/2023 11:59:16 AM By: Midge Aver MSN RN CNS WTA Entered By: Betha Loa on 08/01/2023 12:52:54 -------------------------------------------------------------------------------- Multi Wound Chart Details Patient Name: Date of Service: William Albright HN H. 08/01/2023 3:45 PM Medical Record Number: [id number] Patient Account Number: [id number] Date of Birth/Sex: Treating RN: 1951/06/20 (72 y.o. [name] Primary Care Jamion Carter: Kerby Nora Other Clinician: Betha Loa Referring Mattisyn Cardona: Treating Jaydon Avina/Extender: [name], [name] in Treatment: 26 Vital Signs Height(in): 66 Pulse(bpm): 85 Weight(lbs): 250 Blood Pressure(mmHg): 144/74 Body Mass Index(BMI): 40.3 Temperature(F): 98.2 Respiratory Rate(breaths/min): 18 BETTYE, NIECE H ([id number]):Photos:] [id number] Page 4 of 12:1R 2R 3 No Photos] Right Calcaneus Right, Distal, Dorsal Foot Right, Proximal, Dorsal Foot Wound Location: Gradually Appeared Gradually Appeared Gradually Appeared Wounding Event: Diabetic Wound/Ulcer of the Lower Diabetic Wound/Ulcer of the Lower Diabetic Wound/Ulcer of the Lower Primary Etiology: Extremity Extremity Extremity Congestive Heart Failure, Coronary Congestive Heart Failure, Coronary Congestive Heart Failure, Coronary Comorbid History: Artery Disease, Hypertension, Type II Artery Disease, Hypertension, Type II Artery Disease, Hypertension, Type II Diabetes, Neuropathy Diabetes, Neuropathy Diabetes, Neuropathy 12/10/2022 12/10/2022 12/10/2022 Date Acquired: 26 26 26  Weeks of Treatment: Open Open Open Wound Status: No No No Wound Recurrence: Yes No No Clustered Wound: 0.6x0.5x0.1 0.3x0.6x0.1 0.4x0.5x0.1 Measurements L x W x D (cm) 0.236 0.141 0.157 A (cm)  : rea 0.024 0.014 0.016 Volume (cm) : 94.50% 83.70% 93.80% % Reduction in A rea: 94.40% 83.70% 93.60% % Reduction in Volume: Grade 1 Grade 1 Grade 1 Classification: Medium Medium Medium Exudate A mount: Serosanguineous Serosanguineous Serosanguineous Exudate Type: red, brown red, brown red, brown Exudate Color: Large (67-100%) Large (67-100%) Medium (34-66%) Granulation A mount: Red Red, Pink Red Granulation Quality: None Present (0%) None Present (0%) Medium (34-66%) Necrotic A mount: Fat Layer (Subcutaneous Tissue): Yes Fat Layer (Subcutaneous Tissue): Yes Fat Layer (Subcutaneous Tissue): Yes Exposed Structures: Fascia: No Fascia: No Fascia: No Tendon: No Tendon: No Tendon: No Muscle: No Muscle: No Muscle: No Joint: No Joint: No Joint: No Bone: No Bone: No Bone: No Large (67-100%)  Large (67-100%) Small (1-33%) Epithelialization: Wound Number: 5 N/A N/A Photos: N/A N/A Right T Third oe N/A N/A Wound Location: Blister N/A N/A Wounding Event: Diabetic Wound/Ulcer of the Lower N/A N/A Primary Etiology: Extremity Congestive Heart Failure, Coronary N/A N/A Comorbid History: Artery Disease, Hypertension, Type II Diabetes, Neuropathy 08/01/2023 N/A N/A Date Acquired: 0 N/A N/A Weeks of Treatment: Open N/A N/A Wound Status: No N/A N/A Wound Recurrence: No N/A N/A Clustered Wound: 0.7x0.7x0.1 N/A N/A Measurements L x W x D (cm) 0.385 N/A N/A A (cm) : rea 0.038 N/A N/A Volume (cm) : N/A N/A N/A % Reduction in A rea: N/A N/A N/A % Reduction in Volume: Grade 2 N/A N/A Classification: Medium N/A N/A Exudate A mount: Serosanguineous N/A N/A Exudate Type: red, brown N/A N/A Exudate Color: Medium (34-66%) N/A N/A Granulation A mount: Pink N/A N/A Granulation Quality: Medium (34-66%) N/A N/A Necrotic A mount: Fat Layer (Subcutaneous Tissue): Yes N/A N/A Exposed Structures: Fascia: No Tendon: No Muscle: No Joint: No Bone: No None N/A  N/A EpithelializationOTONIEL, LLANAS ([id number]) [id number] Page 5 of 12 Treatment Notes Electronic Signature(s) Signed: 08/01/2023 4:55:35 PM By: Betha Loa Entered By: Betha Loa on 08/01/2023 12:53:21 -------------------------------------------------------------------------------- Multi-Disciplinary Care Plan Details Patient Name: Date of Service: William Albright HN H. 08/01/2023 3:45 PM Medical Record Number: [id number] Patient Account Number: [id number] Date of Birth/Sex: Treating RN: [birth year] (72 y.o. [name] Primary Care Essica Kiker: Kerby Nora Other Clinician: Betha Loa Referring Gervase Colberg: Treating Donne Baley/Extender: [name], [name] in Treatment: 77 Active Inactive Electronic Signature(s) Signed: 08/01/2023 4:55:35 PM By: Betha Loa Signed: 08/02/2023 11:59:16 AM By: Midge Aver MSN RN CNS WTA Entered By: Betha Loa on 08/01/2023 13:19:23 -------------------------------------------------------------------------------- Pain Assessment Details Patient Name: Date of Service: William Albright HN H. 08/01/2023 3:45 PM Medical Record Number: [id number] Patient Account Number: [id number] Date of Birth/Sex: Treating RN: 1950-09-21 (72 y.o. [name] Primary Care Temesgen Weightman: Kerby Nora Other Clinician: Betha Loa Referring Ameliarose Shark: Treating Shalandria Elsbernd/Extender: [name], [name] in Treatment: 26 Active Problems Location of Pain Severity and Description of Pain Patient Has Paino No Site Locations JARELLE, HAGANS H ([id number]) [id number] Page 6 of 12 Pain Management and Medication Current Pain Management: Electronic Signature(s) Signed: 08/01/2023 4:55:35 PM By: Betha Loa Signed: 08/02/2023 11:59:16 AM By: Midge Aver MSN RN CNS WTA Entered By: Betha Loa on 08/01/2023  12:37:31 -------------------------------------------------------------------------------- Patient/Caregiver Education Details Patient Name: Date of Service: [name] 12/3/2024andnbsp3:45 PM Medical Record Number: [id number] Patient Account Number: [id number] Date of Birth/Gender: Treating RN: [birth year] (73 y.o. [name] Primary Care Physician: Kerby Nora Other Clinician: Betha Loa Referring Physician: Treating Physician/Extender: [name], [name] in Treatment: 58 Education Assessment Education Provided To: Patient Education Topics Provided Wound/Skin Impairment: Handouts: Other: continue wound care as directed Methods: Explain/Verbal Responses: State content correctly Electronic Signature(s) Signed: 08/01/2023 4:55:35 PM By: Betha Loa Entered By: Betha Loa on 08/01/2023 13:36:33 Vickerman, [name] ([id number]) [id number] Page 7 of 12 -------------------------------------------------------------------------------- Wound Assessment Details Patient Name: Date of Service: [name] 08/01/2023 3:45 PM Medical Record Number: [id number] Patient Account Number: [id number] Date of Birth/Sex: Treating RN: 16-Aug-1951 (72 y.o. [name] Primary Care Sabree Nuon: Kerby Nora Other Clinician: Betha Loa Referring Kennidy Lamke: Treating Marriana Hibberd/Extender: [name], [name] in Treatment: 26 Wound Status Wound Number: 1R Primary Diabetic Wound/Ulcer of the Lower Extremity Etiology: Wound Location: Right Calcaneus Wound Open Wounding Event: Gradually Appeared Status: Date Acquired: 12/10/2022  Comorbid Congestive Heart Failure, Coronary Artery Disease, Weeks Of Treatment: 26 History: Hypertension, Type II Diabetes, Neuropathy Clustered Wound: Yes Photos Wound Measurements Length: (cm) 0.6 Width: (cm) 0.5 Depth: (cm) 0.1 Area: (cm) 0.236 Volume: (cm) 0.024 % Reduction in Area: 94.5% %  Reduction in Volume: 94.4% Epithelialization: Large (67-100%) Wound Description Classification: Grade 1 Exudate Amount: Medium Exudate Type: Serosanguineous Exudate Color: red, brown Foul Odor After Cleansing: No Slough/Fibrino Yes Wound Bed Granulation Amount: Large (67-100%) Exposed Structure Granulation Quality: Red Fascia Exposed: No Necrotic Amount: None Present (0%) Fat Layer (Subcutaneous Tissue) Exposed: Yes Tendon Exposed: No Muscle Exposed: No Joint Exposed: No Bone Exposed: No Treatment Notes Wound #1R (Calcaneus) Wound Laterality: Right Cleanser Byram Ancillary Kit - 15 Day Supply Discharge Instruction: Use supplies as instructed; Kit contains: (15) Saline Bullets; (15) 3x3 Gauze; [address] ALEXUS, KACZANOWSKI H ([id number]) [id number] Page 8 of 12 Discharge Instruction: Gently cleanse wound with antibacterial soap, rinse and pat dry prior to dressing wounds Peri-Wound Care Topical Primary Dressing Hydrofera Blue Ready Transfer Foam, 2.5x2.5 (in/in) Discharge Instruction: Apply Hydrofera Blue Ready to wound bed as directed Secondary Dressing (BORDER) Zetuvit Plus SILICONE BORDER Dressing 4x4 (in/in) Discharge Instruction: Please do not put silicone bordered dressings under wraps. Use non-bordered dressing only. Secured With Compression Wrap Compression Stockings Facilities manager) Signed: 08/01/2023 4:55:35 PM By: Betha Loa Signed: 08/02/2023 11:59:16 AM By: Midge Aver MSN RN CNS WTA Entered By: Betha Loa on 08/01/2023 12:49:25 -------------------------------------------------------------------------------- Wound Assessment Details Patient Name: Date of Service: William Albright HN H. 08/01/2023 3:45 PM Medical Record Number: [id number] Patient Account Number: [id number] Date of Birth/Sex: Treating RN: 01-05-1951 (72 y.o. [name] Primary Care Tremar Wickens: Kerby Nora Other Clinician: Betha Loa Referring Gurdeep Keesey: Treating Lajoyce Tamura/Extender: [name], [name] in Treatment: 26 Wound Status Wound Number: 2R Primary Diabetic Wound/Ulcer of the Lower Extremity Etiology: Wound Location: Right, Distal, Dorsal Foot Wound Open Wounding Event: Gradually Appeared Status: Date Acquired: 12/10/2022 Comorbid Congestive Heart Failure, Coronary Artery Disease, Weeks Of Treatment: 26 History: Hypertension, Type II Diabetes, Neuropathy Clustered Wound: No Photos Wound Measurements Length: (cm) 0.3 Width: (cm) 0.6 Depth: (cm) 0.1 KENYUN, LUMLEY ([id number]) Area: (cm) 0.141 Volume: (cm) 0.014 % Reduction in Area: 83.7% % Reduction in Volume: 83.7% Epithelialization: Large (67-100%) [id number] Page 9 of 12 Wound Description Classification: Grade 1 Exudate Amount: Medium Exudate Type: Serosanguineous Exudate Color: red, brown Foul Odor After Cleansing: No Slough/Fibrino No Wound Bed Granulation Amount: Large (67-100%) Exposed Structure Granulation Quality: Red, Pink Fascia Exposed: No Necrotic Amount: None Present (0%) Fat Layer (Subcutaneous Tissue) Exposed: Yes Tendon Exposed: No Muscle Exposed: No Joint Exposed: No Bone Exposed: No Treatment Notes Wound #2R (Foot) Wound Laterality: Dorsal, Right, Distal Cleanser Byram Ancillary Kit - 15 Day Supply Discharge Instruction: Use supplies as instructed; Kit contains: (15) Saline Bullets; (15) 3x3 Gauze; 15 pr Gloves Soap and Water Discharge Instruction: Gently cleanse wound with antibacterial soap, rinse and pat dry prior to dressing wounds Peri-Wound Care Topical Primary Dressing Hydrofera Blue Ready Transfer Foam, 2.5x2.5 (in/in) Discharge Instruction: Apply Hydrofera Blue Ready to wound bed as directed Secondary Dressing (BORDER) Zetuvit Plus SILICONE BORDER Dressing 4x4 (in/in) Discharge Instruction: Please do not put silicone bordered dressings under wraps. Use  non-bordered dressing only. Secured With Compression Wrap Compression Stockings Facilities manager) Signed: 08/01/2023 4:55:35 PM By: Betha Loa Signed: 08/02/2023 11:59:16 AM By: Midge Aver MSN RN CNS WTA Entered By: Betha Loa on 08/01/2023 12:50:07 -------------------------------------------------------------------------------- Wound  Assessment Details Patient Name: Date of Service: [name] 08/01/2023 3:45 PM Medical Record Number: [id number] Patient Account Number: [id number] Date of Birth/Sex: Treating RN: Sep 12, 1950 (72 y.o. [name] Primary Care Lexus Barletta: Kerby Nora Other Clinician: Betha Loa Referring Grady Mohabir: Treating Zadkiel Dragan/Extender: [name], [name] in Treatment: [address], [place] H ([id number]) [id number] Page 10 of 12 Wound Status Wound Number: 3 Primary Diabetic Wound/Ulcer of the Lower Extremity Etiology: Wound Location: Right, Proximal, Dorsal Foot Wound Open Wounding Event: Gradually Appeared Status: Date Acquired: 12/10/2022 Comorbid Congestive Heart Failure, Coronary Artery Disease, Weeks Of Treatment: 26 History: Hypertension, Type II Diabetes, Neuropathy Clustered Wound: No Wound Measurements Length: (cm) 0.4 Width: (cm) 0.5 Depth: (cm) 0.1 Area: (cm) 0.157 Volume: (cm) 0.016 % Reduction in Area: 93.8% % Reduction in Volume: 93.6% Epithelialization: Small (1-33%) Wound Description Classification: Grade 1 Exudate Amount: Medium Exudate Type: Serosanguineous Exudate Color: red, brown Foul Odor After Cleansing: No Slough/Fibrino Yes Wound Bed Granulation Amount: Medium (34-66%) Exposed Structure Granulation Quality: Red Fascia Exposed: No Necrotic Amount: Medium (34-66%) Fat Layer (Subcutaneous Tissue) Exposed: Yes Necrotic Quality: Adherent Slough Tendon Exposed: No Muscle Exposed: No Joint Exposed: No Bone Exposed: No Treatment Notes Wound #3 (Foot) Wound  Laterality: Dorsal, Right, Proximal Cleanser Byram Ancillary Kit - 15 Day Supply Discharge Instruction: Use supplies as instructed; Kit contains: (15) Saline Bullets; (15) 3x3 Gauze; 15 pr Gloves Soap and Water Discharge Instruction: Gently cleanse wound with antibacterial soap, rinse and pat dry prior to dressing wounds Peri-Wound Care Topical Primary Dressing Hydrofera Blue Ready Transfer Foam, 2.5x2.5 (in/in) Discharge Instruction: Apply Hydrofera Blue Ready to wound bed as directed Secondary Dressing (BORDER) Zetuvit Plus SILICONE BORDER Dressing 4x4 (in/in) Discharge Instruction: Please do not put silicone bordered dressings under wraps. Use non-bordered dressing only. Secured With Compression Wrap Compression Stockings Facilities manager) Signed: 08/01/2023 4:55:35 PM By: Betha Loa Signed: 08/02/2023 11:59:16 AM By: Midge Aver MSN RN CNS WTA Entered By: Betha Loa on 08/01/2023 12:51:42 [name] ([id number]) [id number] Page 11 of 12 -------------------------------------------------------------------------------- Wound Assessment Details Patient Name: Date of Service: [name] 08/01/2023 3:45 PM Medical Record Number: [id number] Patient Account Number: [id number] Date of Birth/Sex: Treating RN: Mar 25, 1951 (72 y.o. [name] Primary Care Roman Dubuc: Kerby Nora Other Clinician: Betha Loa Referring Arye Weyenberg: Treating Leontyne Manville/Extender: [name], [name] in Treatment: 26 Wound Status Wound Number: 5 Primary Diabetic Wound/Ulcer of the Lower Extremity Etiology: Wound Location: Right T Third oe Wound Open Wounding Event: Blister Status: Date Acquired: 08/01/2023 Comorbid Congestive Heart Failure, Coronary Artery Disease, Weeks Of Treatment: 0 History: Hypertension, Type II Diabetes, Neuropathy Clustered Wound: No Photos Wound Measurements Length: (cm) 0.7 Width: (cm) 0.7 Depth: (cm)  0.1 Area: (cm) 0.385 Volume: (cm) 0.038 % Reduction in Area: % Reduction in Volume: Epithelialization: None Tunneling: No Undermining: No Wound Description Classification: Grade 2 Exudate Amount: Medium Exudate Type: Serosanguineous Exudate Color: red, brown Foul Odor After Cleansing: No Slough/Fibrino Yes Wound Bed Granulation Amount: Medium (34-66%) Exposed Structure Granulation Quality: Pink Fascia Exposed: No Necrotic Amount: Medium (34-66%) Fat Layer (Subcutaneous Tissue) Exposed: Yes Necrotic Quality: Adherent Slough Tendon Exposed: No Muscle Exposed: No Joint Exposed: No Bone Exposed: No Treatment Notes Wound #5 (Toe Third) Wound Laterality: Right Cleanser Byram Ancillary Kit - 15 Day Supply Discharge Instruction: Use supplies as instructed; Kit contains: (15) Saline Bullets; (15) 3x3 Gauze; [address] AYAANSH, JURAS H ([id number]) [id number] Page 12 of 12 Discharge Instruction: Gently  cleanse wound with antibacterial soap, rinse and pat dry prior to dressing wounds Peri-Wound Care Topical Primary Dressing Hydrofera Blue Ready Transfer Foam, 2.5x2.5 (in/in) Discharge Instruction: Apply Hydrofera Blue Ready to wound bed as directed Secondary Dressing (BORDER) Zetuvit Plus SILICONE BORDER Dressing 4x4 (in/in) Discharge Instruction: Please do not put silicone bordered dressings under wraps. Use non-bordered dressing only. Secured With Compression Wrap Compression Stockings Facilities manager) Signed: 08/01/2023 4:55:35 PM By: Betha Loa Signed: 08/02/2023 11:59:16 AM By: Midge Aver MSN RN CNS WTA Entered By: Betha Loa on 08/01/2023 12:48:15 -------------------------------------------------------------------------------- Vitals Details Patient Name: Date of Service: William Albright HN H. 08/01/2023 3:45 PM Medical Record Number: [id number] Patient Account Number: [id number] Date of Birth/Sex: Treating  RN: [birth year] (72 y.o. [name] Primary Care Seira Cody: Kerby Nora Other Clinician: Betha Loa Referring Ethelene Closser: Treating Josias Tomerlin/Extender: [name], [name] in Treatment: 26 Vital Signs Time Taken: 15:35 Temperature (F): 98.2 Height (in): 66 Pulse (bpm): 85 Weight (lbs): 250 Respiratory Rate (breaths/min): 18 Body Mass Index (BMI): 40.3 Blood Pressure (mmHg): 144/74 Reference Range: 80 - 120 mg / dl Electronic Signature(s) Signed: 08/01/2023 4:55:35 PM By: Betha Loa Entered By: Betha Loa on 08/01/2023 12:37:21

## 2023-08-02 NOTE — Telephone Encounter (Signed)
Please try to schedule CPE with fasting labs prior with Dr. Ermalene Searing after 08/17/2023.

## 2023-08-02 NOTE — Telephone Encounter (Signed)
LVM for patient to c/b and schedule.  

## 2023-08-09 ENCOUNTER — Other Ambulatory Visit: Payer: Self-pay

## 2023-08-09 ENCOUNTER — Telehealth: Payer: Self-pay | Admitting: Family

## 2023-08-09 MED ORDER — APIXABAN 5 MG PO TABS: 5.0000 mg | ORAL_TABLET | Freq: Two times a day (BID) | ORAL | 5 refills | Status: DC

## 2023-08-10 ENCOUNTER — Encounter: Payer: PPO | Admitting: Physician Assistant

## 2023-08-10 DIAGNOSIS — L97512 Non-pressure chronic ulcer of other part of right foot with fat layer exposed: Secondary | ICD-10-CM | POA: Diagnosis not present

## 2023-08-10 DIAGNOSIS — L97412 Non-pressure chronic ulcer of right heel and midfoot with fat layer exposed: Secondary | ICD-10-CM | POA: Diagnosis not present

## 2023-08-10 DIAGNOSIS — E11621 Type 2 diabetes mellitus with foot ulcer: Secondary | ICD-10-CM | POA: Diagnosis not present

## 2023-08-10 NOTE — Progress Notes (Signed)
[name] ([id number]) [id number] Page 1 of 12 Visit Report for 08/10/2023 Arrival Information Details Patient Name: Date of Service: [name] [facility] [name]. 08/10/2023 12:30 PM Medical Record Number: [id number] Patient Account Number: [id number] Date of Birth/Sex: Treating RN: 1951/03/15 (72 y.o. [name]) Yevonne Pax Primary Care [name]: Kerby Nora Other Clinician: Referring [name]: Treating [name]/Extender: [name], [name] in Treatment: 34 Visit Information History Since Last Visit Added or deleted any medications: No Patient Arrived: Ambulatory Any new allergies or adverse reactions: No Arrival Time: 12:35 Had a fall or experienced change in No Accompanied By: self activities of daily living that may affect Transfer Assistance: None risk of falls: Patient Identification Verified: Yes Signs or symptoms of abuse/neglect since last visito No Secondary Verification Process Completed: Yes Hospitalized since last visit: No Patient Requires Transmission-Based Precautions: No Implantable device outside of the clinic excluding No Patient Has Alerts: Yes cellular tissue based products placed in the center Patient Alerts: Patient on Blood Thinner since last visit: Diabetes type 2 Has Dressing in Place as Prescribed: Yes Eliquis/Plavix Pain Present Now: No ABI R 1.23 TBI 0.57 ABI L 0.98 TBI 0.80 Electronic Signature(s) Signed: 08/10/2023 2:31:23 PM By: Yevonne Pax RN Entered By: Yevonne Pax on 08/10/2023 12:39:14 -------------------------------------------------------------------------------- Clinic Level of Care Assessment Details Patient Name: Date of Service: [name] [facility] [name]. 08/10/2023 12:30 PM Medical Record Number: [id number] Patient Account Number: [id number] Date of Birth/Sex: Treating RN: [birth year] (72 y.o. [name]) Yevonne Pax Primary Care [name]: Kerby Nora Other Clinician: Referring [name]: Treating [name]/Extender:  [name], [name] in Treatment: 27 Clinic Level of Care Assessment Items TOOL 1 Quantity Score []  - 0 Use when EandM and Procedure is performed on INITIAL visit ASSESSMENTS - Nursing Assessment / Reassessment []  - 0 General Physical Exam (combine w/ comprehensive assessment (listed just below) when performed on new pt. evals) []  - 0 Comprehensive Assessment (HX, ROS, Risk Assessments, Wounds Hx, etc.) [name] ([id number]) [id number] Page 2 of 12 ASSESSMENTS - Wound and Skin Assessment / Reassessment []  - 0 Dermatologic / Skin Assessment (not related to wound area) ASSESSMENTS - Ostomy and/or Continence Assessment and Care []  - 0 Incontinence Assessment and Management []  - 0 Ostomy Care Assessment and Management (repouching, etc.) PROCESS - Coordination of Care []  - 0 Simple Patient / Family Education for ongoing care []  - 0 Complex (extensive) Patient / Family Education for ongoing care []  - 0 Staff obtains Chiropractor, Records, T Results / Process Orders est []  - 0 Staff telephones HHA, Nursing Homes / Clarify orders / etc []  - 0 Routine Transfer to another Facility (non-emergent condition) []  - 0 Routine Hospital Admission (non-emergent condition) []  - 0 New Admissions / Manufacturing engineer / Ordering NPWT Apligraf, etc. , []  - 0 Emergency Hospital Admission (emergent condition) PROCESS - Special Needs []  - 0 Pediatric / Minor Patient Management []  - 0 Isolation Patient Management []  - 0 Hearing / Language / Visual special needs []  - 0 Assessment of Community assistance (transportation, D/C planning, etc.) []  - 0 Additional assistance / Altered mentation []  - 0 Support Surface(s) Assessment (bed, cushion, seat, etc.) INTERVENTIONS - Miscellaneous []  - 0 External ear exam []  - 0 Patient Transfer (multiple staff / Nurse, adult / Similar devices) []  - 0 Simple Staple / Suture removal (25 or less) []  - 0 Complex  Staple / Suture removal (26 or more) []  - 0 Hypo/Hyperglycemic Management (do not check if billed separately) []  - 0 Ankle /  Brachial Index (ABI) - do not check if billed separately Has the patient been seen at the hospital within the last three years: Yes Total Score: 0 Level Of Care: ____ Electronic Signature(s) Signed: 08/10/2023 2:31:23 PM By: Yevonne Pax RN Entered By: Yevonne Pax on 08/10/2023 12:57:56 -------------------------------------------------------------------------------- Encounter Discharge Information Details Patient Name: Date of Service: [name] [name] 08/10/2023 12:30 PM Medical Record Number: [id number] Patient Account Number: [id number] Date of Birth/Sex: Treating RN: 1950-11-04 (72 y.o. [name] Primary Care [name]: Kerby Nora Other Clinician: Referring Jalexus Brett: Treating [name]/Extender: [name], [name] in Treatment: [address], [name] ([id number]) [id number] Page 3 of 12 Encounter Discharge Information Items Post Procedure Vitals Discharge Condition: Stable Temperature (F): 98.7 Ambulatory Status: Cane Pulse (bpm): 80 Discharge Destination: Home Respiratory Rate (breaths/min): 18 Transportation: Private Auto Blood Pressure (mmHg): 143/77 Accompanied By: self Schedule Follow-up Appointment: Yes Clinical Summary of Care: Electronic Signature(s) Signed: 08/10/2023 1:09:38 PM By: Yevonne Pax RN Entered By: Yevonne Pax on 08/10/2023 13:09:38 -------------------------------------------------------------------------------- Lower Extremity Assessment Details Patient Name: Date of Service: [name] [name] 08/10/2023 12:30 PM Medical Record Number: [id number] Patient Account Number: [id number] Date of Birth/Sex: Treating RN: 04/04/1951 (72 y.o. [name]) Yevonne Pax Primary Care Brianca Fortenberry: Kerby Nora Other Clinician: Referring Shashwat Cleary: Treating Lolah Coghlan/Extender: [name], [name] in  Treatment: 27 Edema Assessment Assessed: [Left: No] [Right: No] Edema: [Left: N] [Right: o] Vascular Assessment Pulses: Dorsalis Pedis Palpable: [Right:Yes] Extremity colors, hair growth, and conditions: Extremity Color: [Right:Normal] Temperature of Extremity: [Right:Warm] Capillary Refill: [Right:< 3 seconds] Dependent Rubor: [Right:No] Blanched when Elevated: [Right:No No] Toe Nail Assessment Left: Right: Thick: Yes Discolored: Yes Deformed: Yes Improper Length and Hygiene: No Electronic Signature(s) Signed: 08/10/2023 2:31:23 PM By: Yevonne Pax RN Entered By: Yevonne Pax on 08/10/2023 12:49:01 [name] ([id number]) [id number] Page 4 of 12 -------------------------------------------------------------------------------- Multi Wound Chart Details Patient Name: Date of Service: [name] [facility] [name]. 08/10/2023 12:30 PM Medical Record Number: [id number] Patient Account Number: [id number] Date of Birth/Sex: Treating RN: [birth year] (72 y.o. [name]) Yevonne Pax Primary Care Octa Uplinger: Kerby Nora Other Clinician: Referring Jackee Glasner: Treating Clemmie Marxen/Extender: [name], [name] in Treatment: 63 Vital Signs Height(in): 66 Pulse(bpm): 80 Weight(lbs): 250 Blood Pressure(mmHg): 143/77 Body Mass Index(BMI): 40.3 Temperature(F): 98 Respiratory Rate(breaths/min): 18 [1R:Photos:] Right Calcaneus Right, Distal, Dorsal Foot Right, Proximal, Dorsal Foot Wound Location: Gradually Appeared Gradually Appeared Gradually Appeared Wounding Event: Diabetic Wound/Ulcer of the Lower Diabetic Wound/Ulcer of the Lower Diabetic Wound/Ulcer of the Lower Primary Etiology: Extremity Extremity Extremity Congestive Heart Failure, Coronary Congestive Heart Failure, Coronary Congestive Heart Failure, Coronary Comorbid History: Artery Disease, Hypertension, Type II Artery Disease, Hypertension, Type II Artery Disease, Hypertension, Type II Diabetes, Neuropathy  Diabetes, Neuropathy Diabetes, Neuropathy 12/10/2022 12/10/2022 12/10/2022 Date Acquired: 27 27 27  Weeks of Treatment: Open Open Open Wound Status: No No No Wound Recurrence: Yes No No Clustered Wound: 0.5x0.3x0.1 0x0x0 0.2x0.2x0.1 Measurements L x W x D (cm) 0.118 0 0.031 A (cm) : rea 0.012 0 0.003 Volume (cm) : 97.20% 100.00% 98.80% % Reduction in A rea: 97.20% 100.00% 98.80% % Reduction in Volume: Grade 1 Grade 1 Grade 1 Classification: Medium Medium Medium Exudate A mount: Serosanguineous Serosanguineous Serosanguineous Exudate Type: red, brown red, brown red, brown Exudate Color: Medium (34-66%) None Present (0%) Medium (34-66%) Granulation A mount: Red N/A Red Granulation Quality: Medium (34-66%) None Present (0%) Medium (34-66%) Necrotic A mount: Fat Layer (Subcutaneous Tissue): Yes Fascia: No Fat Layer (Subcutaneous Tissue): Yes Exposed Structures:  Fascia: No Fat Layer (Subcutaneous Tissue): No Fascia: No Tendon: No Tendon: No Tendon: No Muscle: No Muscle: No Muscle: No Joint: No Joint: No Joint: No Bone: No Bone: No Bone: No Large (67-100%) Large (67-100%) Small (1-33%) Epithelialization: Wound Number: 5 N/A N/A Photos: N/A N/A William, [name] ([id number]) [id number] Page 5 of 12 Right T Third oe N/A N/A Wound Location: Blister N/A N/A Wounding Event: Diabetic Wound/Ulcer of the Lower N/A N/A Primary Etiology: Extremity Congestive Heart Failure, Coronary N/A N/A Comorbid History: Artery Disease, Hypertension, Type II Diabetes, Neuropathy 08/01/2023 N/A N/A Date Acquired: 1 N/A N/A Weeks of Treatment: Open N/A N/A Wound Status: No N/A N/A Wound Recurrence: No N/A N/A Clustered Wound: 0.5x0.4x0.1 N/A N/A Measurements L x W x D (cm) 0.157 N/A N/A A (cm) : rea 0.016 N/A N/A Volume (cm) : 59.20% N/A N/A % Reduction in A rea: 57.90% N/A N/A % Reduction in Volume: Grade 2 N/A N/A Classification: Medium  N/A N/A Exudate A mount: Serosanguineous N/A N/A Exudate Type: red, brown N/A N/A Exudate Color: Medium (34-66%) N/A N/A Granulation A mount: Pink N/A N/A Granulation Quality: Medium (34-66%) N/A N/A Necrotic A mount: Fat Layer (Subcutaneous Tissue): Yes N/A N/A Exposed Structures: Fascia: No Tendon: No Muscle: No Joint: No Bone: No None N/A N/A Epithelialization: Treatment Notes Electronic Signature(s) Signed: 08/10/2023 2:31:23 PM By: Yevonne Pax RN Entered By: Yevonne Pax on 08/10/2023 12:49:06 -------------------------------------------------------------------------------- Multi-Disciplinary Care Plan Details Patient Name: Date of Service: [name] [name] 08/10/2023 12:30 PM Medical Record Number: [id number] Patient Account Number: [id number] Date of Birth/Sex: Treating RN: March 02, 1951 (72 y.o. [name] Primary Care Mandeep Ferch: Kerby Nora Other Clinician: Referring Garet Hooton: Treating Daelin Haste/Extender: [name], [name] in Treatment: 85 Active Inactive Electronic Signature(s) Signed: 08/10/2023 2:31:23 PM By: Yevonne Pax RN Entered By: Yevonne Pax on 08/10/2023 12:49:13 [name] ([id number]) [id number] Page 6 of 12 -------------------------------------------------------------------------------- Pain Assessment Details Patient Name: Date of Service: [name] [facility] [name]. 08/10/2023 12:30 PM Medical Record Number: [id number] Patient Account Number: [id number] Date of Birth/Sex: Treating RN: 1950/09/02 (72 y.o. [name]) Yevonne Pax Primary Care Zerline Melchior: Kerby Nora Other Clinician: Referring Khadijatou Borak: Treating Ean Gettel/Extender: [name], [name] in Treatment: 27 Active Problems Location of Pain Severity and Description of Pain Patient Has Paino No Site Locations Pain Management and Medication Current Pain Management: Electronic Signature(s) Signed: 08/10/2023 2:31:23 PM By: Yevonne Pax  RN Entered By: Yevonne Pax on 08/10/2023 12:40:24 -------------------------------------------------------------------------------- Patient/Caregiver Education Details Patient Name: Date of Service: [name] 12/12/2024andnbsp12:30 PM Medical Record Number: [id number] Patient Account Number: [id number] Date of Birth/Gender: Treating RN: [birth year] (72 y.o. [name] Primary Care Physician: Kerby Nora Other Clinician: Referring Physician: Treating Physician/Extender: [name], [name] in Treatment: [address] ([id number]) [id number] Page 7 of 12 Education Assessment Education Provided To: Patient Education Topics Provided Wound/Skin Impairment: Handouts: Caring for Your Ulcer Methods: Explain/Verbal Responses: State content correctly Electronic Signature(s) Signed: 08/10/2023 2:31:23 PM By: Yevonne Pax RN Entered By: Yevonne Pax on 08/10/2023 12:49:24 -------------------------------------------------------------------------------- Wound Assessment Details Patient Name: Date of Service: [name] [name] 08/10/2023 12:30 PM Medical Record Number: [id number] Patient Account Number: [id number] Date of Birth/Sex: Treating RN: [birth year] (72 y.o. [name] Primary Care Leon Montoya: Kerby Nora Other Clinician: Referring Kamareon Sciandra: Treating Tenna Lacko/Extender: [name], [name] in Treatment: 27 Wound Status Wound Number: 1R Primary Diabetic Wound/Ulcer of the Lower Extremity Etiology: Wound Location: Right Calcaneus Wound Open Wounding  Event: Gradually Appeared Status: Date Acquired: 12/10/2022 Comorbid Congestive Heart Failure, Coronary Artery Disease, Weeks Of Treatment: 27 History: Hypertension, Type II Diabetes, Neuropathy Clustered Wound: Yes Photos Wound Measurements Length: (cm) 0.5 Width: (cm) 0.3 Depth: (cm) 0.1 Area: (cm) 0.118 Volume: (cm) 0.012 % Reduction in Area: 97.2% %  Reduction in Volume: 97.2% Epithelialization: Large (67-100%) Tunneling: No Undermining: No Wound Description Classification: Grade 1 Exudate Amount: Medium Exudate Type: Serosanguineous William, [name] ([id number]) Exudate Color: red, brown Foul Odor After Cleansing: No Slough/Fibrino Yes [phone].pdf Page 8 of 12 Wound Bed Granulation Amount: Medium (34-66%) Exposed Structure Granulation Quality: Red Fascia Exposed: No Necrotic Amount: Medium (34-66%) Fat Layer (Subcutaneous Tissue) Exposed: Yes Necrotic Quality: Adherent Slough Tendon Exposed: No Muscle Exposed: No Joint Exposed: No Bone Exposed: No Treatment Notes Wound #1R (Calcaneus) Wound Laterality: Right Cleanser Byram Ancillary Kit - 15 Day Supply Discharge Instruction: Use supplies as instructed; Kit contains: (15) Saline Bullets; (15) 3x3 Gauze; 15 pr Gloves Soap and Water Discharge Instruction: Gently cleanse wound with antibacterial soap, rinse and pat dry prior to dressing wounds Peri-Wound Care Topical Primary Dressing Hydrofera Blue Ready Transfer Foam, 2.5x2.5 (in/in) Discharge Instruction: Apply Hydrofera Blue Ready to wound bed as directed Secondary Dressing (BORDER) Zetuvit Plus SILICONE BORDER Dressing 4x4 (in/in) Discharge Instruction: Please do not put silicone bordered dressings under wraps. Use non-bordered dressing only. Secured With Compression Wrap Compression Stockings Facilities manager) Signed: 08/10/2023 2:31:23 PM By: Yevonne Pax RN Entered By: Yevonne Pax on 08/10/2023 12:46:44 -------------------------------------------------------------------------------- Wound Assessment Details Patient Name: Date of Service: [name] [name] 08/10/2023 12:30 PM Medical Record Number: [id number] Patient Account Number: [id number] Date of Birth/Sex: Treating RN: [birth year] (72 y.o. [name]) Yevonne Pax Primary Care Dereon Corkery: Kerby Nora Other  Clinician: Referring Juliett Eastburn: Treating Vinaya Sancho/Extender: [name], [name] in Treatment: 27 Wound Status Wound Number: 2R Primary Diabetic Wound/Ulcer of the Lower Extremity Etiology: Wound Location: Right, Distal, Dorsal Foot Wound Open Wounding Event: Gradually Appeared Status: Date Acquired: 12/10/2022 Comorbid Congestive Heart Failure, Coronary Artery Disease, Weeks Of Treatment: 27 History: Hypertension, Type II Diabetes, Neuropathy Clustered Wound: No William, [name] ([id number]) [id number] Page 9 of 12 Photos Wound Measurements Length: (cm) Width: (cm) Depth: (cm) Area: (cm) Volume: (cm) 0 % Reduction in Area: 100% 0 % Reduction in Volume: 100% 0 Epithelialization: Large (67-100%) 0 Tunneling: No 0 Undermining: No Wound Description Classification: Grade 1 Exudate Amount: Medium Exudate Type: Serosanguineous Exudate Color: red, brown Foul Odor After Cleansing: No Slough/Fibrino No Wound Bed Granulation Amount: None Present (0%) Exposed Structure Necrotic Amount: None Present (0%) Fascia Exposed: No Fat Layer (Subcutaneous Tissue) Exposed: No Tendon Exposed: No Muscle Exposed: No Joint Exposed: No Bone Exposed: No Electronic Signature(s) Signed: 08/10/2023 2:31:23 PM By: Yevonne Pax RN Entered By: Yevonne Pax on 08/10/2023 12:47:47 -------------------------------------------------------------------------------- Wound Assessment Details Patient Name: Date of Service: [name] [name] 08/10/2023 12:30 PM Medical Record Number: [id number] Patient Account Number: [id number] Date of Birth/Sex: Treating RN: Dec 28, 1950 (72 y.o. [name]) Yevonne Pax Primary Care Loyalty Brashier: Kerby Nora Other Clinician: Referring Brezlyn Manrique: Treating Mary Secord/Extender: [name], [name] in Treatment: 27 Wound Status Wound Number: 3 Primary Diabetic Wound/Ulcer of the Lower Extremity Etiology: Wound Location: Right, Proximal,  Dorsal Foot Wound Open Wounding Event: Gradually Appeared Status: Date Acquired: 12/10/2022 Comorbid Congestive Heart Failure, Coronary Artery Disease, Weeks Of Treatment: 27 History: Hypertension, Type II Diabetes, Neuropathy Clustered Wound: No Photos William, [name] ([id number]) [id number] Page 10 of 12 Wound Measurements Length: (cm)  0.2 Width: (cm) 0.2 Depth: (cm) 0.1 Area: (cm) 0.031 Volume: (cm) 0.003 % Reduction in Area: 98.8% % Reduction in Volume: 98.8% Epithelialization: Small (1-33%) Tunneling: No Undermining: No Wound Description Classification: Grade 1 Exudate Amount: Medium Exudate Type: Serosanguineous Exudate Color: red, brown Foul Odor After Cleansing: No Slough/Fibrino Yes Wound Bed Granulation Amount: Medium (34-66%) Exposed Structure Granulation Quality: Red Fascia Exposed: No Necrotic Amount: Medium (34-66%) Fat Layer (Subcutaneous Tissue) Exposed: Yes Necrotic Quality: Adherent Slough Tendon Exposed: No Muscle Exposed: No Joint Exposed: No Bone Exposed: No Treatment Notes Wound #3 (Foot) Wound Laterality: Dorsal, Right, Proximal Cleanser Byram Ancillary Kit - 15 Day Supply Discharge Instruction: Use supplies as instructed; Kit contains: (15) Saline Bullets; (15) 3x3 Gauze; 15 pr Gloves Soap and Water Discharge Instruction: Gently cleanse wound with antibacterial soap, rinse and pat dry prior to dressing wounds Peri-Wound Care Topical Primary Dressing Hydrofera Blue Ready Transfer Foam, 2.5x2.5 (in/in) Discharge Instruction: Apply Hydrofera Blue Ready to wound bed as directed Secondary Dressing (BORDER) Zetuvit Plus SILICONE BORDER Dressing 4x4 (in/in) Discharge Instruction: Please do not put silicone bordered dressings under wraps. Use non-bordered dressing only. Secured With Compression Wrap Compression Stockings Facilities manager) Signed: 08/10/2023 2:31:23 PM By: Yevonne Pax RN Entered By:  Yevonne Pax on 08/10/2023 12:48:11 [name] ([id number]) [id number] Page 11 of 12 -------------------------------------------------------------------------------- Wound Assessment Details Patient Name: Date of Service: [name] [facility] [name]. 08/10/2023 12:30 PM Medical Record Number: [id number] Patient Account Number: [id number] Date of Birth/Sex: Treating RN: Jul 13, 1951 (72 y.o. [name]) Yevonne Pax Primary Care Niccolo Burggraf: Kerby Nora Other Clinician: Referring Graeden Bitner: Treating Evone Arseneau/Extender: [name], [name] in Treatment: 27 Wound Status Wound Number: 5 Primary Diabetic Wound/Ulcer of the Lower Extremity Etiology: Wound Location: Right T Third oe Wound Open Wounding Event: Blister Status: Date Acquired: 08/01/2023 Comorbid Congestive Heart Failure, Coronary Artery Disease, Weeks Of Treatment: 1 History: Hypertension, Type II Diabetes, Neuropathy Clustered Wound: No Photos Wound Measurements Length: (cm) 0.5 Width: (cm) 0.4 Depth: (cm) 0.1 Area: (cm) 0.157 Volume: (cm) 0.016 % Reduction in Area: 59.2% % Reduction in Volume: 57.9% Epithelialization: None Tunneling: No Undermining: No Wound Description Classification: Grade 2 Exudate Amount: Medium Exudate Type: Serosanguineous Exudate Color: red, brown Foul Odor After Cleansing: No Slough/Fibrino Yes Wound Bed Granulation Amount: Medium (34-66%) Exposed Structure Granulation Quality: Pink Fascia Exposed: No Necrotic Amount: Medium (34-66%) Fat Layer (Subcutaneous Tissue) Exposed: Yes Necrotic Quality: Adherent Slough Tendon Exposed: No Muscle Exposed: No Joint Exposed: No Bone Exposed: No Treatment Notes Wound #5 (Toe Third) Wound Laterality: Right Cleanser Byram Ancillary Kit - 15 Day Supply Discharge Instruction: Use supplies as instructed; Kit contains: (15) Saline Bullets; (15) 3x3 Gauze; 15 pr [address] William, [name] [name] ([id number])  [id number] Page 12 of 12 Discharge Instruction: Gently cleanse wound with antibacterial soap, rinse and pat dry prior to dressing wounds Peri-Wound Care Topical Primary Dressing Hydrofera Blue Ready Transfer Foam, 2.5x2.5 (in/in) Discharge Instruction: Apply Hydrofera Blue Ready to wound bed as directed Secondary Dressing (BORDER) Zetuvit Plus SILICONE BORDER Dressing 4x4 (in/in) Discharge Instruction: Please do not put silicone bordered dressings under wraps. Use non-bordered dressing only. Secured With Compression Wrap Compression Stockings Facilities manager) Signed: 08/10/2023 2:31:23 PM By: Yevonne Pax RN Entered By: Yevonne Pax on 08/10/2023 12:48:32 -------------------------------------------------------------------------------- Vitals Details Patient Name: Date of Service: [name] [name] 08/10/2023 12:30 PM Medical Record Number: [id number] Patient Account Number: [id number] Date of Birth/Sex: Treating RN: 1951-03-31 (72 y.o. [name]) Yevonne Pax Primary Care Janay Canan:  Ermalene Searing, Amy Other Clinician: Referring Aneli Zara: Treating Balian Schaller/Extender: [name], [name] in Treatment: 27 Vital Signs Time Taken: 12:39 Temperature (F): 98 Height (in): 66 Pulse (bpm): 80 Weight (lbs): 250 Respiratory Rate (breaths/min): 18 Body Mass Index (BMI): 40.3 Blood Pressure (mmHg): 143/77 Reference Range: 80 - 120 mg / dl Electronic Signature(s) Signed: 08/10/2023 2:31:23 PM By: Yevonne Pax RN Entered By: Yevonne Pax on 08/10/2023 12:40:18

## 2023-08-10 NOTE — Progress Notes (Addendum)
MANSOUR, LOGGINS ([id number]) [id number] Page 1 of 12 Visit Report for 08/10/2023 Chief Complaint Document Details Patient Name: Date of Service: [name] [facility]. 08/10/2023 12:30 PM Medical Record Number: [id number] Patient Account Number: [id number] Date of Birth/Sex: Treating RN: 01-30-1951 (72 y.o. [name]) Yevonne Pax Primary Care Provider: Kerby Nora Other Clinician: Referring Provider: Treating Provider/Extender: [name], [name] in Treatment: 27 Information Obtained from: Patient Chief Complaint Right foot ulcers Electronic Signature(s) Signed: 08/10/2023 12:48:28 PM By: Allen Derry PA-C Entered By: Allen Derry on 08/10/2023 12:48:28 -------------------------------------------------------------------------------- Debridement Details Patient Name: Date of Service: [name] [facility]. 08/10/2023 12:30 PM Medical Record Number: [id number] Patient Account Number: [id number] Date of Birth/Sex: Treating RN: 18-Feb-1951 (72 y.o. [name]) Yevonne Pax Primary Care Provider: Kerby Nora Other Clinician: Referring Provider: Treating Provider/Extender: [name], [name] in Treatment: 27 Debridement Performed for Assessment: Wound #3 Right,Proximal,Dorsal Foot Performed By: Physician Allen Derry, PA-C The following information was scribed by: Yevonne Pax The information was scribed for: Allen Derry Debridement Type: Debridement Severity of Tissue Pre Debridement: Fat layer exposed Level of Consciousness (Pre-procedure): Awake and Alert Pre-procedure Verification/Time Out Yes - 12:53 Taken: Start Time: 12:53 Percent of Wound Bed Debrided: 100% T Area Debrided (cm): otal 0.03 Tissue and other material debrided: Viable, Non-Viable, Skin: Dermis , Skin: Epidermis Level: Skin/Epidermis Debridement Description: Selective/Open Wound Instrument: Curette Bleeding: None End Time: 12:59 TIM, DEPIANO ([id number])  [phone] Page 2 of 12 Procedural Pain: 0 Post Procedural Pain: 0 Response to Treatment: Procedure was tolerated well Level of Consciousness (Post- Awake and Alert procedure): Post Debridement Measurements of Total Wound Length: (cm) 0.2 Width: (cm) 0.2 Depth: (cm) 0.1 Volume: (cm) 0.003 Character of Wound/Ulcer Post Debridement: Improved Severity of Tissue Post Debridement: Fat layer exposed Post Procedure Diagnosis Same as Pre-procedure Electronic Signature(s) Signed: 08/10/2023 2:31:23 PM By: Yevonne Pax RN Signed: 08/10/2023 4:38:12 PM By: Allen Derry PA-C Entered By: Yevonne Pax on 08/10/2023 12:56:15 -------------------------------------------------------------------------------- Debridement Details Patient Name: Date of Service: [name] [facility]. 08/10/2023 12:30 PM Medical Record Number: [id number] Patient Account Number: [id number] Date of Birth/Sex: Treating RN: 1951/01/30 (72 y.o. [name]) Yevonne Pax Primary Care Provider: Kerby Nora Other Clinician: Referring Provider: Treating Provider/Extender: [name], [name] in Treatment: 27 Debridement Performed for Assessment: Wound #1R Right Calcaneus Performed By: Physician Allen Derry, PA-C The following information was scribed by: Yevonne Pax The information was scribed for: Allen Derry Debridement Type: Debridement Severity of Tissue Pre Debridement: Fat layer exposed Level of Consciousness (Pre-procedure): Awake and Alert Pre-procedure Verification/Time Out Yes - 12:53 Taken: Start Time: 12:53 Percent of Wound Bed Debrided: 100% T Area Debrided (cm): otal 0.12 Tissue and other material debrided: Viable, Non-Viable, Slough, Subcutaneous, Slough Level: Skin/Subcutaneous Tissue Debridement Description: Excisional Instrument: Curette Bleeding: None End Time: 12:59 Procedural Pain: 0 Post Procedural Pain: 0 Response to Treatment: Procedure was tolerated well Level of  Consciousness (Post- Awake and Alert procedure): Post Debridement Measurements of Total Wound Length: (cm) 0.5 Width: (cm) 0.3 Depth: (cm) 0.1 Volume: (cm) 0.012 Character of Wound/Ulcer Post Debridement: Improved Severity of Tissue Post Debridement: Fat layer exposed JACORY, MCGRANE ([id number]) [id number] Page 3 of 12 Post Procedure Diagnosis Same as Pre-procedure Electronic Signature(s) Signed: 08/10/2023 1:08:14 PM By: Yevonne Pax RN Signed: 08/10/2023 4:38:12 PM By: Allen Derry PA-C Entered By: Yevonne Pax on 08/10/2023 13:08:14 -------------------------------------------------------------------------------- Debridement Details Patient Name: Date of Service: [name] [facility]. 08/10/2023 12:30 PM Medical Record Number: [id number] Patient Account  Number: [id number] Date of Birth/Sex: Treating RN: 10/17/1950 (72 y.o. [name]) Yevonne Pax Primary Care Provider: Kerby Nora Other Clinician: Referring Provider: Treating Provider/Extender: [name], [name] in Treatment: 27 Debridement Performed for Assessment: Wound #5 Right T Third oe Performed By: Physician Allen Derry, PA-C The following information was scribed by: Yevonne Pax The information was scribed for: Allen Derry Debridement Type: Debridement Severity of Tissue Pre Debridement: Fat layer exposed Level of Consciousness (Pre-procedure): Awake and Alert Pre-procedure Verification/Time Out Yes - 12:53 Taken: Start Time: 12:53 Pain Control: Lidocaine 4% T opical Solution Percent of Wound Bed Debrided: 100% T Area Debrided (cm): otal 0.16 Tissue and other material debrided: Viable, Non-Viable, Slough, Subcutaneous, Biofilm, Slough Level: Skin/Subcutaneous Tissue Debridement Description: Excisional Instrument: Curette Bleeding: None End Time: 12:59 Procedural Pain: 0 Post Procedural Pain: 0 Response to Treatment: Procedure was tolerated well Level of Consciousness (Post-  Awake and Alert procedure): Post Debridement Measurements of Total Wound Length: (cm) 0.5 Width: (cm) 0.4 Depth: (cm) 0.1 Volume: (cm) 0.016 Character of Wound/Ulcer Post Debridement: Improved Severity of Tissue Post Debridement: Fat layer exposed Post Procedure Diagnosis Same as Pre-procedure Electronic Signature(s) Signed: 08/10/2023 1:08:34 PM By: Yevonne Pax RN Signed: 08/10/2023 4:38:12 PM By: Allen Derry PA-C Entered By: Yevonne Pax on 08/10/2023 13:08:34 [name] ([id number]) [id number] Page 4 of 12 -------------------------------------------------------------------------------- HPI Details Patient Name: Date of Service: [name] [facility] H. 08/10/2023 12:30 PM Medical Record Number: [id number] Patient Account Number: [id number] Date of Birth/Sex: Treating RN: 1951/07/15 (72 y.o. [name]) Yevonne Pax Primary Care Provider: Kerby Nora Other Clinician: Referring Provider: Treating Provider/Extender: [name], [name] in Treatment: 7 History of Present Illness HPI Description: 01-31-2023 upon evaluation today patient appears to be doing poorly currently in regard to wounds on his right heel, right dorsal foot, and right lateral foot. Subsequently he does have known peripheral vascular disease and again this is something that he needs to I think Checked formally. This is what the majority of the conversation today hinged around and the patient voiced understanding as far as that is concerned. Fortunately I do not see any signs of active infection locally nor systemically which is great news. Patient does have a history of diabetes mellitus type 2, atrial fibrillation for which she is on long-term anticoagulant Therapy, hypertension, and coronary artery disease. Patient's hemoglobin A1c most recently was on 11-17-2022 and was 7.2 and currently he is on Eliquis and Plavix. 02-07-2023 upon evaluation today patient appears to be doing well  currently in regard to his wounds all things considered I feel like we are still maintaining that does not seem like it is any worse is also not significantly better. I discussed with the patient that I do believe he would benefit from the arterial evaluation we still need to get this done as quickly as possible and subsequently we did put in a follow-up call with the vascular office today with regard to this. 02-14-2023 upon evaluation today patient appears to be doing a little better in regard to his wounds which are showing signs of loosening which is great news. With that being said he is experiencing an improvement overall in his symptoms and we are still waiting on the vascular evaluation want to get this result and consider whether or not his blood flow is good or not we will be able to make a better determination of next steps. For now we will try and avoid any aggressive sharp debridement to know that he has good  arterial flow. 02-21-2023 upon evaluation today patient appears to be doing about the same in regard to his wound. Fortunately there does not appear to be any signs of active infection at this time which is great news. No fevers, chills, nausea, vomiting, or diarrhea. I did review patient's arterial study and it appears that he has pretty good flow in the left is not perfect but it is decent. On the right however he is definitely not doing nearly as good and I think that he is going to need to see one of the vascular doctors for further evaluation and treatment of this right leg in order to get these wounds to heal. 02-28-2023 upon evaluation today patient appears to be doing well currently in regard to his wound. He has been tolerating the dressing changes without complication. Fortunately I do not see any evidence of active infection locally nor systemically at this time. I do think that the eschar started to soften up and I would like to try to get some of this off to see if we can get  things moving in the right direction. He is in agreement with this plan. 03-09-2023 upon evaluation today patient appears to be doing well currently in regard to his wound. He has been tolerating the dressing changes without complication. Fortunately there does not appear to be any signs of active infection locally nor systemically at this time which is great news. I do believe clearing out some of the necrotic debris last week has helped to a degree. 03-16-2023 upon evaluation today patient appears to be doing well currently in regard to his wounds. He is going to be having a vascular angiogram in order to open up blood flow and I think this is going to be very beneficial for him. Fortunately I do not see any evidence of active infection locally nor systemically which is great news. 7/25; patient with predominantly ischemic wounds to on his dorsal foot and 1 on the right heel. He is going for his angiogram on Monday. He has been using the Iodoflex to the wound and changing this dressing himself. 04-11-2023 upon evaluation today patient appears to be doing well currently in regard to his wounds. In fact he had the arterial procedure with vascular and the great news is he actually has much improved blood flow he had a 95% blockage of the popliteal as well as the posterior tibial based on the record review. Post procedure he now has a residual of only about 30% in the popliteal and closer around 10% in the posterior tibial. The blood flow is greatly improved and it is obvious based on what we are seeing. 04-20-2023 upon evaluation today patient appears to be doing well currently in regard to his wounds. They are actually showing signs of excellent improvement and to be honest now that he is got good arterial flow going he is really healing quite nicely. Fortunately I do not see any signs of active infection locally or systemically which is great news. 8/29 this is a patient who has known arterial  insufficiency. She has 2 wounds on the dorsal right foot and 1 on the back of the heel. She had an angiogram at the end of July and had follow-up arterial studies yesterday but I cannot pull up the arterial studies. We are using Prisma and Zituvimet and really doing quite well. The wounds are better 05-08-2023 upon evaluation today patient appears to be doing well currently in regard to his ulcers. He has  been tolerating the dressing changes without complication. Fortunately there does not appear to be any signs of active infection looking or systemically at this time. 05-29-2023 upon evaluation today patient appears to be doing well currently in regard to his wounds are all pretty healthy appearing. With that being said unfortunately he is having some issues here with to the wounds that were previously pretty much closed having reopened. Either way I think that the wounds are healthy appearing. 06-06-2023 upon evaluation today patient's wounds appear to be doing well though the areas actually appear to be a little bit irritated due to what appears to be drainage from the Band-Aids. I think we need to go back to doing the Zetuvit and wrapping as opposed to using the Band-Aids. 06-13-2023 upon evaluation today patient appears to be doing excellent in regard to all 3 wound locations. He has been tolerating the dressing changes without ZAYYAN, TRIMARCO ([id number]) [id number] Page 5 of 12 complication and in general I believe that he is making excellent headway towards closure which is great news. No fevers, chills, nausea, vomiting, or diarrhea. 06-22-2023 upon evaluation today patient actually appears to be making good headway towards closure I am actually very pleased with where we stand today. I do not see any signs of active infection locally or systemically which is great news and in general I do think that we are making good headway towards complete closure which is great  news. 06-30-2023 upon evaluation today patient appears to be doing well currently in regard to his wounds they are not getting quite a small sound like to see they are getting a little smaller here and there at set for the 1 on the distal foot right side which actually showing signs of plateauing. Working to see how things continue to move but if it does not significantly improved by next week I may consider looking into Apligraf for him as a possibility. The patient is in agreement with that plan. 07-11-2023 upon evaluation today patient appears to be doing okay currently in regard to his wounds although the wound on the top of his foot approximately is actually larger I think is because is rubbing on his shoe. I believe that he may do better to switch out for different shoes. I recommended that tennis shoes in particular would probably be the best way to go. 07-20-2023 upon evaluation today patient appears to be doing well currently in regard to the wounds on his right foot. This is actually the best that have seen them in quite some time. Fortunately I do not see any evidence of worsening at this point with regard to the wounds and I think that he is making good headway here towards closure. 08-01-2023 upon evaluation today patient appears to be doing well currently in regard to his wound. He has been tolerating the dressing changes without complication. His wounds in general seem to be doing better although he does have some areas that are showing signs of still having some issue with healing that he has a new wound on the toe that he did have previous. 08-10-2023 upon evaluation today patient's wounds are in some areas getting better in other areas it is doing about the same or just slightly better in 1 spot is healed which is on the dorsum of the foot. Fortunately I do not see any signs of infection at any site although did require debridement. Electronic Signature(s) Signed: 08/10/2023 3:09:29  PM By: Allen Derry PA-C Entered By: Larina Bras,  Leonard Schwartz on 08/10/2023 15:09:28 -------------------------------------------------------------------------------- Physical Exam Details Patient Name: Date of Service: [name] [facility] H. 08/10/2023 12:30 PM Medical Record Number: [id number] Patient Account Number: [id number] Date of Birth/Sex: Treating RN: [birth year] (72 y.o. [name]) Yevonne Pax Primary Care Provider: Kerby Nora Other Clinician: Referring Provider: Treating Provider/Extender: [name], [name] in Treatment: 48 Constitutional Well-nourished and well-hydrated in no acute distress. Respiratory normal breathing without difficulty. Psychiatric this patient is able to make decisions and demonstrates good insight into disease process. Alert and Oriented x 3. pleasant and cooperative. Notes I did perform debridement all 3 locations today. The distal portion of the foot was healed the toe did require debridement down to subcutaneous tissue and I was able to remove slough and biofilm as well as necrotic tissue in general. In regard to the dorsum of the foot approximately this was just skin that was removed and slough and subcutaneous tissue as well as callus removed from the heel. Electronic Signature(s) Signed: 08/10/2023 3:09:57 PM By: Allen Derry PA-C Entered By: Allen Derry on 08/10/2023 15:09:57 [name] ([id number]) [id number] Page 6 of 12 -------------------------------------------------------------------------------- Physician Orders Details Patient Name: Date of Service: [name] [facility] H. 08/10/2023 12:30 PM Medical Record Number: [id number] Patient Account Number: [id number] Date of Birth/Sex: Treating RN: 1951/03/25 (72 y.o. [name]) Yevonne Pax Primary Care Provider: Kerby Nora Other Clinician: Referring Provider: Treating Provider/Extender: [name], [name] in Treatment: 27 The following information was scribed by: Yevonne Pax The information was scribed for: Allen Derry Verbal / Phone Orders: No Diagnosis Coding ICD-10 Coding Code Description E11.621 Type 2 diabetes mellitus with foot ulcer L97.512 Non-pressure chronic ulcer of other part of right foot with fat layer exposed I25.10 Atherosclerotic heart disease of native coronary artery without angina pectoris I48.0 Paroxysmal atrial fibrillation Z79.01 Long term (current) use of anticoagulants I10 Essential (primary) hypertension Follow-up Appointments Return Appointment in 1 week. Bathing/ [employer] wounds with antibacterial soap and water. Anesthetic (Use 'Patient Medications' Section for Anesthetic Order Entry) Lidocaine applied to wound bed Edema Control - Orders / Instructions Elevate, Exercise Daily and A void Standing for Long Periods of Time. Elevate legs to the level of the heart and pump ankles as often as possible Elevate leg(s) parallel to the floor when sitting. Wound Treatment Wound #1R - Calcaneus Wound Laterality: Right Cleanser: Byram Ancillary Kit - 15 Day Supply (Generic) 3 x Per Week/30 Days Discharge Instructions: Use supplies as instructed; Kit contains: (15) Saline Bullets; (15) 3x3 Gauze; 15 pr Gloves Cleanser: Soap and Water 3 x Per Week/30 Days Discharge Instructions: Gently cleanse wound with antibacterial soap, rinse and pat dry prior to dressing wounds Prim Dressing: Hydrofera Blue Ready Transfer Foam, 2.5x2.5 (in/in) (Dispense As Written) 3 x Per Week/30 Days ary Discharge Instructions: Apply Hydrofera Blue Ready to wound bed as directed Secondary Dressing: (BORDER) Zetuvit Plus SILICONE BORDER Dressing 4x4 (in/in) (Dispense As Written) 3 x Per Week/30 Days Discharge Instructions: Please do not put silicone bordered dressings under wraps. Use non-bordered dressing only. Wound #3 - Foot Wound Laterality: Dorsal, Right, Proximal Cleanser: Byram Ancillary Kit - 15 Day Supply (Generic) 3 x Per Week/30  Days Discharge Instructions: Use supplies as instructed; Kit contains: (15) Saline Bullets; (15) 3x3 Gauze; 15 pr Gloves Cleanser: Soap and Water 3 x Per Week/30 Days Discharge Instructions: Gently cleanse wound with antibacterial soap, rinse and pat dry prior to dressing wounds Prim Dressing: Hydrofera Blue Ready Transfer Foam, 2.5x2.5 (in/in) (Dispense As Written) 3  x Per Week/30 Days ary Discharge Instructions: Apply Hydrofera Blue Ready to wound bed as directed Secondary Dressing: (BORDER) Zetuvit Plus SILICONE BORDER Dressing 4x4 (in/in) (Dispense As Written) 3 x Per Week/30 Days KALDEN, DORIN ([id number]) [id number] Page 7 of 12 Discharge Instructions: Please do not put silicone bordered dressings under wraps. Use non-bordered dressing only. Wound #5 - T Third oe Wound Laterality: Right Cleanser: Byram Ancillary Kit - 15 Day Supply (Dispense As Written) 3 x Per Week/30 Days Discharge Instructions: Use supplies as instructed; Kit contains: (15) Saline Bullets; (15) 3x3 Gauze; 15 pr Gloves Cleanser: Soap and Water 3 x Per Week/30 Days Discharge Instructions: Gently cleanse wound with antibacterial soap, rinse and pat dry prior to dressing wounds Prim Dressing: Hydrofera Blue Ready Transfer Foam, 2.5x2.5 (in/in) (Dispense As Written) 3 x Per Week/30 Days ary Discharge Instructions: Apply Hydrofera Blue Ready to wound bed as directed Secondary Dressing: (BORDER) Zetuvit Plus SILICONE BORDER Dressing 4x4 (in/in) (Dispense As Written) 3 x Per Week/30 Days Discharge Instructions: Please do not put silicone bordered dressings under wraps. Use non-bordered dressing only. Electronic Signature(s) Signed: 08/10/2023 2:31:23 PM By: Yevonne Pax RN Signed: 08/10/2023 4:38:12 PM By: Allen Derry PA-C Entered By: Yevonne Pax on 08/10/2023 12:54:36 -------------------------------------------------------------------------------- Problem List Details Patient Name: Date of  Service: [name] [facility]. 08/10/2023 12:30 PM Medical Record Number: [id number] Patient Account Number: [id number] Date of Birth/Sex: Treating RN: [birth year] (72 y.o. [name]) Yevonne Pax Primary Care Provider: Kerby Nora Other Clinician: Referring Provider: Treating Provider/Extender: [name], [name] in Treatment: 27 Active Problems ICD-10 Encounter Code Description Active Date MDM Diagnosis E11.621 Type 2 diabetes mellitus with foot ulcer 01/31/2023 No Yes L97.512 Non-pressure chronic ulcer of other part of right foot with fat layer exposed 01/31/2023 No Yes I25.10 Atherosclerotic heart disease of native coronary artery without angina pectoris 01/31/2023 No Yes I48.0 Paroxysmal atrial fibrillation 01/31/2023 No Yes Z79.01 Long term (current) use of anticoagulants 01/31/2023 No Yes I10 Essential (primary) hypertension 01/31/2023 No Yes NIEVES, JESSEE ([id number]) [id number] Page 8 of 12 Inactive Problems Resolved Problems Electronic Signature(s) Signed: 08/10/2023 12:48:23 PM By: Allen Derry PA-C Entered By: Allen Derry on 08/10/2023 12:48:23 -------------------------------------------------------------------------------- Progress Note Details Patient Name: Date of Service: [name] [facility] H. 08/10/2023 12:30 PM Medical Record Number: [id number] Patient Account Number: [id number] Date of Birth/Sex: Treating RN: 03/19/1951 (72 y.o. [name]) Yevonne Pax Primary Care Provider: Kerby Nora Other Clinician: Referring Provider: Treating Provider/Extender: [name], [name] in Treatment: 27 Subjective Chief Complaint Information obtained from Patient Right foot ulcers History of Present Illness (HPI) 01-31-2023 upon evaluation today patient appears to be doing poorly currently in regard to wounds on his right heel, right dorsal foot, and right lateral foot. Subsequently he does have known peripheral vascular disease and again this is something  that he needs to I think Checked formally. This is what the majority of the conversation today hinged around and the patient voiced understanding as far as that is concerned. Fortunately I do not see any signs of active infection locally nor systemically which is great news. Patient does have a history of diabetes mellitus type 2, atrial fibrillation for which she is on long-term anticoagulant Therapy, hypertension, and coronary artery disease. Patient's hemoglobin A1c most recently was on 11-17-2022 and was 7.2 and currently he is on Eliquis and Plavix. 02-07-2023 upon evaluation today patient appears to be doing well currently in regard to his wounds all things considered I feel like we are still  maintaining that does not seem like it is any worse is also not significantly better. I discussed with the patient that I do believe he would benefit from the arterial evaluation we still need to get this done as quickly as possible and subsequently we did put in a follow-up call with the vascular office today with regard to this. 02-14-2023 upon evaluation today patient appears to be doing a little better in regard to his wounds which are showing signs of loosening which is great news. With that being said he is experiencing an improvement overall in his symptoms and we are still waiting on the vascular evaluation want to get this result and consider whether or not his blood flow is good or not we will be able to make a better determination of next steps. For now we will try and avoid any aggressive sharp debridement to know that he has good arterial flow. 02-21-2023 upon evaluation today patient appears to be doing about the same in regard to his wound. Fortunately there does not appear to be any signs of active infection at this time which is great news. No fevers, chills, nausea, vomiting, or diarrhea. I did review patient's arterial study and it appears that he has pretty good flow in the left is not  perfect but it is decent. On the right however he is definitely not doing nearly as good and I think that he is going to need to see one of the vascular doctors for further evaluation and treatment of this right leg in order to get these wounds to heal. 02-28-2023 upon evaluation today patient appears to be doing well currently in regard to his wound. He has been tolerating the dressing changes without complication. Fortunately I do not see any evidence of active infection locally nor systemically at this time. I do think that the eschar started to soften up and I would like to try to get some of this off to see if we can get things moving in the right direction. He is in agreement with this plan. 03-09-2023 upon evaluation today patient appears to be doing well currently in regard to his wound. He has been tolerating the dressing changes without complication. Fortunately there does not appear to be any signs of active infection locally nor systemically at this time which is great news. I do believe clearing out some of the necrotic debris last week has helped to a degree. 03-16-2023 upon evaluation today patient appears to be doing well currently in regard to his wounds. He is going to be having a vascular angiogram in order to open up blood flow and I think this is going to be very beneficial for him. Fortunately I do not see any evidence of active infection locally nor systemically which is great news. 7/25; patient with predominantly ischemic wounds to on his dorsal foot and 1 on the right heel. He is going for his angiogram on Monday. He has been using the Iodoflex to the wound and changing this dressing himself. 04-11-2023 upon evaluation today patient appears to be doing well currently in regard to his wounds. In fact he had the arterial procedure with vascular and the great news is he actually has much improved blood flow he had a 95% blockage of the popliteal as well as the posterior tibial based  on the record review. Post procedure he now has a residual of only about 30% in the popliteal and closer around 10% in the posterior tibial. The blood flow is  greatly improved and it is obvious based on what we are seeing. MITT, LAMASTER ([id number]) [id number] Page 9 of 12 04-20-2023 upon evaluation today patient appears to be doing well currently in regard to his wounds. They are actually showing signs of excellent improvement and to be honest now that he is got good arterial flow going he is really healing quite nicely. Fortunately I do not see any signs of active infection locally or systemically which is great news. 8/29 this is a patient who has known arterial insufficiency. She has 2 wounds on the dorsal right foot and 1 on the back of the heel. She had an angiogram at the end of July and had follow-up arterial studies yesterday but I cannot pull up the arterial studies. We are using Prisma and Zituvimet and really doing quite well. The wounds are better 05-08-2023 upon evaluation today patient appears to be doing well currently in regard to his ulcers. He has been tolerating the dressing changes without complication. Fortunately there does not appear to be any signs of active infection looking or systemically at this time. 05-29-2023 upon evaluation today patient appears to be doing well currently in regard to his wounds are all pretty healthy appearing. With that being said unfortunately he is having some issues here with to the wounds that were previously pretty much closed having reopened. Either way I think that the wounds are healthy appearing. 06-06-2023 upon evaluation today patient's wounds appear to be doing well though the areas actually appear to be a little bit irritated due to what appears to be drainage from the Band-Aids. I think we need to go back to doing the Zetuvit and wrapping as opposed to using the Band-Aids. 06-13-2023 upon evaluation today  patient appears to be doing excellent in regard to all 3 wound locations. He has been tolerating the dressing changes without complication and in general I believe that he is making excellent headway towards closure which is great news. No fevers, chills, nausea, vomiting, or diarrhea. 06-22-2023 upon evaluation today patient actually appears to be making good headway towards closure I am actually very pleased with where we stand today. I do not see any signs of active infection locally or systemically which is great news and in general I do think that we are making good headway towards complete closure which is great news. 06-30-2023 upon evaluation today patient appears to be doing well currently in regard to his wounds they are not getting quite a small sound like to see they are getting a little smaller here and there at set for the 1 on the distal foot right side which actually showing signs of plateauing. Working to see how things continue to move but if it does not significantly improved by next week I may consider looking into Apligraf for him as a possibility. The patient is in agreement with that plan. 07-11-2023 upon evaluation today patient appears to be doing okay currently in regard to his wounds although the wound on the top of his foot approximately is actually larger I think is because is rubbing on his shoe. I believe that he may do better to switch out for different shoes. I recommended that tennis shoes in particular would probably be the best way to go. 07-20-2023 upon evaluation today patient appears to be doing well currently in regard to the wounds on his right foot. This is actually the best that have seen them in quite some time. Fortunately I do not see any evidence  of worsening at this point with regard to the wounds and I think that he is making good headway here towards closure. 08-01-2023 upon evaluation today patient appears to be doing well currently in regard to his  wound. He has been tolerating the dressing changes without complication. His wounds in general seem to be doing better although he does have some areas that are showing signs of still having some issue with healing that he has a new wound on the toe that he did have previous. 08-10-2023 upon evaluation today patient's wounds are in some areas getting better in other areas it is doing about the same or just slightly better in 1 spot is healed which is on the dorsum of the foot. Fortunately I do not see any signs of infection at any site although did require debridement. Objective Constitutional Well-nourished and well-hydrated in no acute distress. Vitals Time Taken: 12:39 PM, Height: 66 in, Weight: 250 lbs, BMI: 40.3, Temperature: 98 F, Pulse: 80 bpm, Respiratory Rate: 18 breaths/min, Blood Pressure: 143/77 mmHg. Respiratory normal breathing without difficulty. Psychiatric this patient is able to make decisions and demonstrates good insight into disease process. Alert and Oriented x 3. pleasant and cooperative. General Notes: I did perform debridement all 3 locations today. The distal portion of the foot was healed the toe did require debridement down to subcutaneous tissue and I was able to remove slough and biofilm as well as necrotic tissue in general. In regard to the dorsum of the foot approximately this was just skin that was removed and slough and subcutaneous tissue as well as callus removed from the heel. Integumentary (Hair, Skin) Wound #1R status is Open. Original cause of wound was Gradually Appeared. The date acquired was: 12/10/2022. The wound has been in treatment 27 weeks. The wound is located on the Right Calcaneus. The wound measures 0.5cm length x 0.3cm width x 0.1cm depth; 0.118cm^2 area and 0.012cm^3 volume. There is Fat Layer (Subcutaneous Tissue) exposed. There is no tunneling or undermining noted. There is a medium amount of serosanguineous drainage noted. There is medium  (34-66%) red granulation within the wound bed. There is a medium (34-66%) amount of necrotic tissue within the wound bed including Adherent Slough. Wound #2R status is Open. Original cause of wound was Gradually Appeared. The date acquired was: 12/10/2022. The wound has been in treatment 27 weeks. The wound is located on the Right,Distal,Dorsal Foot. The wound measures 0cm length x 0cm width x 0cm depth; 0cm^2 area and 0cm^3 volume. There is no tunneling or undermining noted. There is a medium amount of serosanguineous drainage noted. There is no granulation within the wound bed. There is no necrotic tissue within the wound bed. Wound #3 status is Open. Original cause of wound was Gradually Appeared. The date acquired was: 12/10/2022. The wound has been in treatment 27 weeks. The wound is located on the Right,Proximal,Dorsal Foot. The wound measures 0.2cm length x 0.2cm width x 0.1cm depth; 0.031cm^2 area and 0.003cm^3 volume. There is Fat Layer (Subcutaneous Tissue) exposed. There is no tunneling or undermining noted. There is a medium amount of serosanguineous drainage noted. There is medium (34-66%) red granulation within the wound bed. There is a medium (34-66%) amount of necrotic tissue within the wound bed including Adherent Slough. NICHLAS, MORELAND ([id number]) [id number] Page 10 of 12 Wound #5 status is Open. Original cause of wound was Blister. The date acquired was: 08/01/2023. The wound has been in treatment 1 weeks. The wound is located on the Right  T Third. The wound measures 0.5cm length x 0.4cm width x 0.1cm depth; 0.157cm^2 area and 0.016cm^3 volume. There is Fat Layer oe (Subcutaneous Tissue) exposed. There is no tunneling or undermining noted. There is a medium amount of serosanguineous drainage noted. There is medium (34-66%) pink granulation within the wound bed. There is a medium (34-66%) amount of necrotic tissue within the wound bed including Adherent  Slough. 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 There was a Excisional Skin/Subcutaneous Tissue Debridement with a total area of 0.12 sq cm performed by Allen Derry, PA-C. With the following instrument(s): Curette to remove Viable and Non-Viable tissue/material. Material removed includes Subcutaneous Tissue and Slough and. No specimens were taken. A time out was conducted at 12:53, prior to the start of the procedure. There was no bleeding. The procedure was tolerated well with a pain level of 0 throughout and a pain level of 0 following the procedure. Post Debridement Measurements: 0.5cm length x 0.3cm width x 0.1cm depth; 0.012cm^3 volume. Character of Wound/Ulcer Post Debridement is improved. Severity of Tissue Post Debridement is: Fat layer exposed. Post procedure Diagnosis Wound #1R: Same as Pre-Procedure Wound #3 Pre-procedure diagnosis of Wound #3 is a Diabetic Wound/Ulcer of the Lower Extremity located on the Right,Proximal,Dorsal Foot .Severity of Tissue Pre Debridement is: Fat layer exposed. There was a Selective/Open Wound Skin/Epidermis Debridement with a total area of 0.03 sq cm performed by Allen Derry, PA-C. With the following instrument(s): Curette to remove Viable and Non-Viable tissue/material. Material removed includes Skin: Dermis and Skin: Epidermis and. No specimens were taken. A time out was conducted at 12:53, prior to the start of the procedure. There was no bleeding. The procedure  was tolerated well with a pain level of 0 throughout and a pain level of 0 following the procedure. Post Debridement Measurements: 0.2cm length x 0.2cm width x 0.1cm depth; 0.003cm^3 volume. Character of Wound/Ulcer Post Debridement is improved. Severity of Tissue Post Debridement is: Fat layer exposed. Post procedure Diagnosis Wound #3: Same as Pre-Procedure Wound #5 Pre-procedure diagnosis of Wound #5 is a Diabetic Wound/Ulcer of the Lower Extremity located on the Right T Third .Severity of Tissue Pre Debridement is: oe Fat layer exposed. There was a Excisional Skin/Subcutaneous Tissue Debridement with a total area of 0.16 sq cm performed by Allen Derry, PA-C. With the following instrument(s): Curette to remove Viable and Non-Viable tissue/material. Material removed includes Subcutaneous Tissue, Slough, and Biofilm after achieving pain control using Lidocaine 4% Topical Solution. No specimens were taken. A time out was conducted at 12:53, prior to the start of the procedure. There was no bleeding. The procedure was tolerated well with a pain level of 0 throughout and a pain level of 0 following the procedure. Post Debridement Measurements: 0.5cm length x 0.4cm width x 0.1cm depth; 0.016cm^3 volume. Character of Wound/Ulcer Post Debridement is improved. Severity of Tissue Post Debridement is: Fat layer exposed. Post procedure Diagnosis Wound #5: Same as Pre-Procedure Plan Follow-up Appointments: Return Appointment in 1 week. Bathing/ Shower/ Hygiene: Wash wounds with antibacterial soap and water. Anesthetic (Use 'Patient Medications' Section for Anesthetic Order Entry):  Lidocaine applied to wound bed Edema Control - Orders / Instructions: Elevate, Exercise Daily and Avoid Standing for Long Periods of Time. Elevate legs to the level of the heart and pump ankles as often as possible Elevate leg(s) parallel to the floor when sitting. WOUND #1R: - Calcaneus Wound Laterality: Right Cleanser:  Byram Ancillary Kit - 15 Day Supply (Generic) 3 x Per Week/30 Days Discharge Instructions: Use supplies as instructed; Kit contains: (15) Saline Bullets; (15) 3x3 Gauze; 15 pr Gloves Cleanser: Soap and Water 3 x Per Week/30 Days Discharge Instructions: Gently cleanse wound with antibacterial soap, rinse and pat dry prior to dressing wounds Prim Dressing: Hydrofera Blue Ready Transfer Foam, 2.5x2.5 (in/in) (Dispense As Written) 3 x Per Week/30 Days ary Discharge Instructions: Apply Hydrofera Blue Ready to wound bed as directed Secondary Dressing: (BORDER) Zetuvit Plus SILICONE BORDER Dressing 4x4 (in/in) (Dispense As Written) 3 x Per Week/30 Days Discharge Instructions: Please do not put silicone bordered dressings under wraps. Use non-bordered dressing only. WOUND #3: - Foot Wound Laterality: Dorsal, Right, Proximal Cleanser: Byram Ancillary Kit - 15 Day Supply (Generic) 3 x Per Week/30 Days Discharge Instructions: Use supplies as instructed; Kit contains: (15) Saline Bullets; (15) 3x3 Gauze; 15 pr Gloves PRINSTON, PIANKA ([id number]) [id number] Page 11 of 12 Cleanser: Soap and Water 3 x Per Week/30 Days Discharge Instructions: Gently cleanse wound with antibacterial soap, rinse and pat dry prior to dressing wounds Prim Dressing: Hydrofera Blue Ready Transfer Foam, 2.5x2.5 (in/in) (Dispense As Written) 3 x Per Week/30 Days ary Discharge Instructions: Apply Hydrofera Blue Ready to wound bed as directed Secondary Dressing: (BORDER) Zetuvit Plus SILICONE BORDER Dressing 4x4 (in/in) (Dispense As Written) 3 x Per Week/30 Days Discharge Instructions: Please do not put silicone bordered dressings under wraps. Use non-bordered dressing only. WOUND #5: - T Third Wound Laterality: Right oe Cleanser: Byram Ancillary Kit - 15 Day Supply (Dispense As Written) 3 x Per Week/30 Days Discharge Instructions: Use supplies as instructed; Kit contains: (15) Saline Bullets; (15) 3x3 Gauze;  15 pr Gloves Cleanser: Soap and Water 3 x Per Week/30 Days Discharge Instructions: Gently cleanse wound with antibacterial soap, rinse and pat dry prior to dressing wounds Prim Dressing: Hydrofera Blue Ready Transfer Foam, 2.5x2.5 (in/in) (Dispense As Written) 3 x Per Week/30 Days ary Discharge Instructions: Apply Hydrofera Blue Ready to wound bed as directed Secondary Dressing: (BORDER) Zetuvit Plus SILICONE BORDER Dressing 4x4 (in/in) (Dispense As Written) 3 x Per Week/30 Days Discharge Instructions: Please do not put silicone bordered dressings under wraps. Use non-bordered dressing only. 1. I would recommend based on what we are seeing that we have the patient going continue to monitor for any signs of infection or worsening. Based on what I see I do believe that we are making good headway here towards closure. 2. I am going to recommend as well that we have him continue with [facility] which I think is doing quite well. 3 muscle can have him continue with the bordered foam dressings to cover and Band-Aid with a border foam would not fit. We will see patient back for reevaluation in 1 week here in the clinic. If anything worsens or changes patient will contact our office for additional recommendations. Electronic Signature(s) Signed: 08/10/2023 3:10:30 PM By: Allen Derry PA-C Entered By: Allen Derry on 08/10/2023 15:10:30 -------------------------------------------------------------------------------- SuperBill Details Patient Name: Date of Service: [name] HN H. 08/10/2023 Medical Record Number: [id number] Patient Account Number: [id number] Date of Birth/Sex: Treating RN: Apr 02, 1951 (72 y.o. M)  Yevonne Pax Primary Care Provider: Kerby Nora Other Clinician: Referring Provider: Treating Provider/Extender: [name], [name] in Treatment: 27 Diagnosis Coding ICD-10 Codes Code Description E11.621 Type 2 diabetes mellitus with foot ulcer L97.512 Non-pressure  chronic ulcer of other part of right foot with fat layer exposed I25.10 Atherosclerotic heart disease of native coronary artery without angina pectoris I48.0 Paroxysmal atrial fibrillation Z79.01 Long term (current) use of anticoagulants I10 Essential (primary) hypertension Facility Procedures : CPT4 Code: [id number] Description: 11042 - DEB SUBQ TISSUE 20 SQ CM/< ICD-10 Diagnosis Description L97.512 Non-pressure chronic ulcer of other part of right foot with fat layer exposed Modifier: Quantity: 1 Physician Procedures : CPT4 Code Description Modifier 11042 11042 - WC PHYS SUBQ TISS 20 SQ CM ICD-10 Diagnosis Description L97.512 Non-pressure chronic ulcer of other part of right foot with fat layer exposed Quantity: 1 : [id number] - WC PHYS DEBR WO ANESTH 20 SQ CM ICD-10 Diagnosis Description L97.512 Non-pressure chronic ulcer of other part of right foot with fat layer exposed Quantity: 1 Electronic Signature(s) Signed: 08/10/2023 3:10:48 PM By: Allen Derry PA-C Entered By: Allen Derry on 08/10/2023 15:10:47

## 2023-08-14 ENCOUNTER — Other Ambulatory Visit: Payer: Self-pay

## 2023-08-14 MED ORDER — APIXABAN 5 MG PO TABS: 5.0000 mg | ORAL_TABLET | Freq: Two times a day (BID) | ORAL | 5 refills | Status: DC

## 2023-08-14 NOTE — Telephone Encounter (Signed)
Refill sent to [facility] per pt request.

## 2023-08-17 ENCOUNTER — Ambulatory Visit: Payer: PPO | Admitting: Physician Assistant

## 2023-08-24 ENCOUNTER — Telehealth: Payer: Self-pay | Admitting: *Deleted

## 2023-08-24 DIAGNOSIS — E1169 Type 2 diabetes mellitus with other specified complication: Secondary | ICD-10-CM

## 2023-08-24 DIAGNOSIS — Z125 Encounter for screening for malignant neoplasm of prostate: Secondary | ICD-10-CM

## 2023-08-24 DIAGNOSIS — E11319 Type 2 diabetes mellitus with unspecified diabetic retinopathy without macular edema: Secondary | ICD-10-CM

## 2023-08-24 NOTE — Telephone Encounter (Signed)
-----   Message from Alvina Chou sent at 08/24/2023  2:48 PM EST ----- Regarding: Lab order for Mon, 1.6.25 Patient is scheduled for CPX labs, please order future labs, Thanks , Camelia Eng

## 2023-08-25 ENCOUNTER — Ambulatory Visit: Payer: PPO | Admitting: Physician Assistant

## 2023-09-04 ENCOUNTER — Telehealth: Payer: Self-pay

## 2023-09-04 ENCOUNTER — Other Ambulatory Visit: Payer: PPO

## 2023-09-04 NOTE — Telephone Encounter (Signed)
 Marland Kitchen

## 2023-09-04 NOTE — Telephone Encounter (Signed)
Per appt notes appt has already been rescheduled. 

## 2023-09-05 ENCOUNTER — Other Ambulatory Visit (INDEPENDENT_AMBULATORY_CARE_PROVIDER_SITE_OTHER): Payer: PPO

## 2023-09-05 DIAGNOSIS — Z794 Long term (current) use of insulin: Secondary | ICD-10-CM | POA: Diagnosis not present

## 2023-09-05 DIAGNOSIS — E1169 Type 2 diabetes mellitus with other specified complication: Secondary | ICD-10-CM | POA: Diagnosis not present

## 2023-09-05 DIAGNOSIS — E11319 Type 2 diabetes mellitus with unspecified diabetic retinopathy without macular edema: Secondary | ICD-10-CM

## 2023-09-05 DIAGNOSIS — E785 Hyperlipidemia, unspecified: Secondary | ICD-10-CM | POA: Diagnosis not present

## 2023-09-05 DIAGNOSIS — Z125 Encounter for screening for malignant neoplasm of prostate: Secondary | ICD-10-CM | POA: Diagnosis not present

## 2023-09-05 LAB — LIPID PANEL
Cholesterol: 173 mg/dL (ref 0–200)
HDL: 41.7 mg/dL (ref >39.00)
LDL Cholesterol: 114 mg/dL — ABNORMAL HIGH (ref 0–99)
NonHDL: 130.93
Total CHOL/HDL Ratio: 4
Triglycerides: 87 mg/dL (ref 0.0–149.0)
VLDL: 17.4 mg/dL (ref 0.0–40.0)

## 2023-09-05 LAB — COMPREHENSIVE METABOLIC PANEL
ALT: 17 U/L (ref 0–53)
AST: 14 U/L (ref 0–37)
Albumin: 4.1 g/dL (ref 3.5–5.2)
Alkaline Phosphatase: 97 U/L (ref 39–117)
BUN: 27 mg/dL — ABNORMAL HIGH (ref 6–23)
CO2: 30 meq/L (ref 19–32)
Calcium: 9.3 mg/dL (ref 8.4–10.5)
Chloride: 100 meq/L (ref 96–112)
Creatinine, Ser: 1.04 mg/dL (ref 0.40–1.50)
GFR: 71.08 mL/min (ref >60.00)
Glucose, Bld: 222 mg/dL — ABNORMAL HIGH (ref 70–99)
Potassium: 4.1 meq/L (ref 3.5–5.1)
Sodium: 139 meq/L (ref 135–145)
Total Bilirubin: 0.4 mg/dL (ref 0.2–1.2)
Total Protein: 7.1 g/dL (ref 6.0–8.3)

## 2023-09-05 LAB — HEMOGLOBIN A1C: Hgb A1c MFr Bld: 9.3 % — ABNORMAL HIGH (ref 4.6–6.5)

## 2023-09-05 LAB — PSA, MEDICARE: PSA: 0.3 ng/mL (ref 0.10–4.00)

## 2023-09-05 NOTE — Progress Notes (Signed)
 No critical labs need to be addressed urgently. We will discuss labs in detail at upcoming office visit.

## 2023-09-07 ENCOUNTER — Encounter: Payer: Self-pay | Admitting: Family Medicine

## 2023-09-07 ENCOUNTER — Ambulatory Visit: Payer: PPO | Admitting: Family Medicine

## 2023-09-07 VITALS — BP 138/64 | HR 79 | Temp 97.9°F | Ht 65.75 in | Wt 262.5 lb

## 2023-09-07 DIAGNOSIS — I152 Hypertension secondary to endocrine disorders: Secondary | ICD-10-CM | POA: Diagnosis not present

## 2023-09-07 DIAGNOSIS — E11319 Type 2 diabetes mellitus with unspecified diabetic retinopathy without macular edema: Secondary | ICD-10-CM | POA: Diagnosis not present

## 2023-09-07 DIAGNOSIS — F331 Major depressive disorder, recurrent, moderate: Secondary | ICD-10-CM

## 2023-09-07 DIAGNOSIS — Z7984 Long term (current) use of oral hypoglycemic drugs: Secondary | ICD-10-CM

## 2023-09-07 DIAGNOSIS — Z7985 Long-term (current) use of injectable non-insulin antidiabetic drugs: Secondary | ICD-10-CM

## 2023-09-07 DIAGNOSIS — E11621 Type 2 diabetes mellitus with foot ulcer: Secondary | ICD-10-CM

## 2023-09-07 DIAGNOSIS — E785 Hyperlipidemia, unspecified: Secondary | ICD-10-CM

## 2023-09-07 DIAGNOSIS — I739 Peripheral vascular disease, unspecified: Secondary | ICD-10-CM

## 2023-09-07 DIAGNOSIS — E113393 Type 2 diabetes mellitus with moderate nonproliferative diabetic retinopathy without macular edema, bilateral: Secondary | ICD-10-CM

## 2023-09-07 DIAGNOSIS — G8929 Other chronic pain: Secondary | ICD-10-CM | POA: Diagnosis not present

## 2023-09-07 DIAGNOSIS — I25118 Atherosclerotic heart disease of native coronary artery with other forms of angina pectoris: Secondary | ICD-10-CM

## 2023-09-07 DIAGNOSIS — Z794 Long term (current) use of insulin: Secondary | ICD-10-CM

## 2023-09-07 DIAGNOSIS — Z0000 Encounter for general adult medical examination without abnormal findings: Secondary | ICD-10-CM | POA: Diagnosis not present

## 2023-09-07 DIAGNOSIS — M545 Low back pain, unspecified: Secondary | ICD-10-CM | POA: Diagnosis not present

## 2023-09-07 DIAGNOSIS — I4819 Other persistent atrial fibrillation: Secondary | ICD-10-CM

## 2023-09-07 DIAGNOSIS — E1169 Type 2 diabetes mellitus with other specified complication: Secondary | ICD-10-CM | POA: Diagnosis not present

## 2023-09-07 DIAGNOSIS — E1159 Type 2 diabetes mellitus with other circulatory complications: Secondary | ICD-10-CM

## 2023-09-07 LAB — HM DIABETES FOOT EXAM

## 2023-09-07 MED ORDER — HYDROCODONE-ACETAMINOPHEN 5-325 MG PO TABS: 1.0000 | ORAL_TABLET | Freq: Three times a day (TID) | ORAL | 0 refills | Status: DC | PRN

## 2023-09-07 MED ORDER — FREESTYLE LIBRE 2 READER DEVI: 0 refills | Status: AC

## 2023-09-07 NOTE — Assessment & Plan Note (Signed)
 Chronic, poor control   Venlafaxine 225 mg daily

## 2023-09-07 NOTE — Assessment & Plan Note (Signed)
 Chronic, worsening control   On Trulicity 4.5 mg weekly. [name] and NovoLog  Jardiance 10 mg p.o. daily   Reevaluate in 3 months.

## 2023-09-07 NOTE — Assessment & Plan Note (Signed)
 In setting of diabetes and peripheral artery disease. Currently followed by wound care center.

## 2023-09-07 NOTE — Assessment & Plan Note (Signed)
 Acute, status post cardioversion. Followed by cardiology.  On anticoagulation

## 2023-09-07 NOTE — Assessment & Plan Note (Signed)
Followed closely by cardiology/  

## 2023-09-07 NOTE — Assessment & Plan Note (Signed)
Chronic, inadequate control with Zetia.  Would definitely suggest consideration of PCSK9 inhibitor.  If cardiologist not looking into this in the near future I will move forward with it.

## 2023-09-07 NOTE — Patient Instructions (Signed)
 Work on low cholesterol low carb diet.Marland Kitchen decrease sweets!

## 2023-09-07 NOTE — Assessment & Plan Note (Signed)
Set up yearly eye exam for diabetes and have the opthalmologist send [place] a copy of the evaluation for the chart.

## 2023-09-07 NOTE — Progress Notes (Signed)
 Patient ID: [name], male    DOB: May 29, 1951, 73 y.o.   MRN: [id number]  This visit was conducted in person.  BP 138/64   Pulse 79   Temp 97.9 F (36.6 C) (Oral)   Ht 5' 5.75 (1.67 m)   Wt 262 lb 8 oz (119.1 kg)   SpO2 98%   BMI 42.69 kg/m    CC: Chief Complaint  Patient presents with   Annual Exam    MCR prt 2 [AWV- 11/09/22].    Subjective:   HPI: [name] is a 73 y.o. male presenting on 09/07/2023 for Annual Exam (MCR prt 2 [AWV- 11/09/22].)  The patient presents for  complete physical and review of chronic health problems. He/She also has the following acute concerns today:  The patient saw a LPN or RN for medicare wellness visit. 11/09/2022  Prevention and wellness was reviewed in detail. Note reviewed and important notes copied below.  Reviewed  cardiology office visit from  06/19/2023  atrial fibrillation, ischemic cardiomyopathy HFrEF Rate controlled with carvedilol  .. Did not tolerate 12.5 mg twice daily given low BP.. now back on 6.25 mg BID On Plavix  75 mg daily, started on apixaban  for anticoagulation. On anticoagulation: apixaban  5 mg BID  He underwent successful cardioversion on 03/02/2020  Heart failure controlled with continued on carvedilol  6.25 mg twice daily, Jardiance  10 mg daily, furosemide  40 mg daily and the second dose daily as needed for weight gain of 2 pounds or greater, Entresto  24/26 mg twice daily and spironolactone  25 mg daily.    PAD, followed by vascular   Being followed by wound care center, Dr. Bethena for right calcaneus wound.  On clopidogrel  75 mg p.o. daily BP Readings from Last 3 Encounters:  09/07/23 138/64  07/17/23 118/80  06/19/23 130/63   Diabetes:  recent  significant worsening of  control on Jardiance  and Trulicity , NovoLog  and Tresiba  50 Meter per pt has been running in 40s and he was pushing carbs... now with new meter CBG 200-300  He feel the  freestyle libre sensor may have been dropped to low. Lab Results   Component Value Date   HGBA1C 9.3 (H) 09/05/2023  Using medications without difficulties: Hypoglycemic episodes: none Hyperglycemic episodes:   occ Feet problems: PAD Blood Sugars averaging: FBS 100 eye exam within last year: yes  Statin indicated that patient intolerant to multiple statins in the past Currently on Zetia  10 mg daily.  Cardiology looking into a PCSK9 inhibitor. Lab Results  Component Value Date   CHOL 173 09/05/2023   HDL 41.70 09/05/2023   LDLCALC 114 (H) 09/05/2023   LDLDIRECT 174.0 10/19/2017   TRIG 87.0 09/05/2023   CHOLHDL 4 09/05/2023     Indication for chronic opioid: chronic low back pain Medication and dose: oxycodone /acetaminophen  5/325 mg 1 to 2 tablets daily as needed for pain.   Worsening pain in low back.  Requiring 3 times daily. # pills per month: 60 Last UDS date: 05/12/2022 Opioid Treatment Agreement signed (Y/N): 05/25/23 Opioid Treatment Agreement last reviewed with patient:   [facility] reviewed this encounter (include red flags): Yes   PDMP reviewed during this encounter.  No red flags.   MDD.[name] states depression well controlled but more anxious, nervous. Trouble with  pain keeping up  at night.    GAD,moderate MDD:  Inadequate control on venlafaxine  225 mg daily  He is getting frustrated given vision worsening . 'I am going blind  He is very tearful out of  the blue.  Worsening in last  several weeks. Summer usually helps him... has been worse over the last few months.  He states he has no energy.     02/22/2023   11:42 AM 11/16/2021    9:37 AM 11/05/2021    2:37 PM  GAD 7 : Generalized Anxiety Score  Nervous, Anxious, on Edge 0 2 3  Control/stop worrying 0 2 3  Worry too much - different things 0 3 0  Trouble relaxing 0 3 3  Restless 0 1 3  Easily annoyed or irritable 0 2 0  Afraid - awful might happen 0 1 0  Total GAD 7 Score 0 14 12  Anxiety Difficulty Not difficult at all Extremely difficult         02/22/2023   11:42  AM 11/09/2022   10:58 AM 10/24/2022    3:22 PM  Depression screen PHQ 2/9  Decreased Interest 0 2 1  Down, Depressed, Hopeless 0 2 1  PHQ - 2 Score 0 4 2  Altered sleeping 3 3 3   Tired, decreased energy 3 3 3   Change in appetite 0 0 0  Feeling bad or failure about yourself  0 2 1  Trouble concentrating 3 0 1  Moving slowly or fidgety/restless 0 0 0  Suicidal thoughts 0 0 1  PHQ-9 Score 9 12 11   Difficult doing work/chores Somewhat difficult Not difficult at all        Relevant past medical, surgical, family and social history reviewed and updated as indicated. Interim medical history since our last visit reviewed. Allergies and medications reviewed and updated. Outpatient Medications Prior to Visit  Medication Sig Dispense Refill   Acetaminophen  (TYLENOL  PO) Take 3-4 tablets by mouth as needed (pain).     apixaban  (ELIQUIS ) 5 MG TABS tablet Take 1 tablet (5 mg total) by mouth 2 (two) times daily. 60 tablet 5   carvedilol  (COREG ) 6.25 MG tablet Take 1 tablet (6.25 mg total) by mouth 2 (two) times daily with a meal. 90 tablet 3   cholecalciferol  (VITAMIN D3) 25 MCG (1000 UNIT) tablet Take 2,000 Units by mouth daily.     clopidogrel  (PLAVIX ) 75 MG tablet TAKE ONE TABLET BY MOUTH ONCE DAILY WITH BREAKFAST 90 tablet 3   Continuous Blood Gluc Sensor (FREESTYLE LIBRE 2 SENSOR) MISC APPLY SENSOR EVERY 14 DAYS TO MONITOR SUGAR CONTINOUSLY 2 each 11   Dulaglutide  (TRULICITY ) 4.5 MG/0.5ML SOPN Inject 4.5 mg into the skin once a week. Via [employer] PAP     ENTRESTO  24-26 MG TAKE ONE TABLET BY MOUTH TWO TIMES DAILY. 60 tablet 3   ezetimibe  (ZETIA ) 10 MG tablet Take 1 tablet (10 mg total) by mouth daily. 90 tablet 3   furosemide  (LASIX ) 40 MG tablet Take 40 mg by mouth daily.     gabapentin  (NEURONTIN ) 100 MG capsule Take 1 capsule (100 mg total) by mouth at bedtime. 30 capsule 3   hydrOXYzine  (ATARAX ) 10 MG tablet TAKE 1 TABLET BY MOUTH 3 TIMES DAILY AS NEEDED FOR ANXIETY 30 tablet 2   insulin   aspart (NOVOLOG  FLEXPEN) 100 UNIT/ML FlexPen INJECT 13 UNITS UNDER THE SKIN EVERY MORNING AND THREE UNITS AS NEEDED IN THE EVENING. 15 mL 2   insulin  degludec (TRESIBA  FLEXTOUCH) 100 UNIT/ML FlexTouch Pen Inject 50 Units into the skin daily. 15 mL 2   isosorbide  mononitrate (IMDUR ) 30 MG 24 hr tablet Take 1 tablet (30 mg total) by mouth 2 (two) times daily. 60 tablet 2  JARDIANCE  10 MG TABS tablet TAKE ONE TABLET (10 MG TOTAL) BY MOUTH DAILY. 30 tablet 5   nitroGLYCERIN  (NITROSTAT ) 0.4 MG SL tablet DISSOLVE 1 TABLET UNDER TONGUE AS NEEDEDFOR CHEST PAIN. MAY REPEAT 5 MINUTES APART 3 TIMES IF NEEDED 25 tablet 3   spironolactone  (ALDACTONE ) 25 MG tablet TAKE ONE TABLET (25 MG TOTAL) BY MOUTH DAILY. 30 tablet 5   TRUEPLUS 5-BEVEL PEN NEEDLES 31G X 6 MM MISC USE TO INJECT INSULIN  THREE TIMES A DAY 300 each 3   venlafaxine  XR (EFFEXOR -XR) 150 MG 24 hr capsule TAKE ONE CAPSULE BY MOUTH DAILY WITH BREAKFAST. TAKE WITH EFFEXOR  XR 75MG  FOR A TOTAL OF 225MG  90 capsule 1   venlafaxine  XR (EFFEXOR -XR) 75 MG 24 hr capsule TAKE ONE CAPSULE BY MOUTH ONCE DAILY. TAKE IN ADDITION TO THE 150 MG CAPSULE FOR A TOTAL DOSE OF 225MG  DAILY 90 capsule 1   vitamin B-12 (CYANOCOBALAMIN ) 1000 MCG tablet Take 1,000 mcg by mouth daily.     Continuous Glucose Receiver (FREESTYLE LIBRE 2 READER) DEVI USE WITH SENSORS TO MONITOR SUGAR CONTINUOUSLY 1 each 0   HYDROcodone -acetaminophen  (NORCO/VICODIN) 5-325 MG tablet Take 1 tablet by mouth 3 (three) times daily as needed for moderate pain (pain score 4-6). 90 tablet 0   HYDROcodone -acetaminophen  (NORCO/VICODIN) 5-325 MG tablet Take 1 tablet by mouth 3 (three) times daily as needed for moderate pain (pain score 4-6). 90 tablet 0   HYDROcodone -acetaminophen  (NORCO/VICODIN) 5-325 MG tablet Take 1 tablet by mouth 3 (three) times daily as needed for moderate pain (pain score 4-6). 90 tablet 0   No facility-administered medications prior to visit.     Per HPI unless specifically indicated  in ROS section below Review of Systems  Constitutional:  Negative for fatigue and fever.  HENT:  Negative for ear pain.   Eyes:  Negative for pain.  Respiratory:  Negative for cough and shortness of breath.   Cardiovascular:  Negative for chest pain, palpitations and leg swelling.  Gastrointestinal:  Negative for abdominal pain.  Genitourinary:  Negative for dysuria.  Musculoskeletal:  Negative for arthralgias.  Neurological:  Negative for syncope, light-headedness and headaches.  Psychiatric/Behavioral:  Negative for dysphoric mood.    Objective:  BP 138/64   Pulse 79   Temp 97.9 F (36.6 C) (Oral)   Ht 5' 5.75 (1.67 m)   Wt 262 lb 8 oz (119.1 kg)   SpO2 98%   BMI 42.69 kg/m   Wt Readings from Last 3 Encounters:  09/07/23 262 lb 8 oz (119.1 kg)  07/17/23 252 lb (114.3 kg)  06/19/23 256 lb (116.1 kg)      Physical Exam Constitutional:      Appearance: He is well-developed. He is obese.  HENT:     Head: Normocephalic.     Right Ear: Hearing normal.     Left Ear: Hearing normal.     Nose: Nose normal.  Neck:     Thyroid : No thyroid  mass or thyromegaly.     Vascular: No carotid bruit.     Trachea: Trachea normal.  Cardiovascular:     Rate and Rhythm: Normal rate and regular rhythm.     Pulses: Normal pulses.     Heart sounds: Heart sounds not distant. No murmur heard.    No friction rub. No gallop.     Comments: No peripheral edema Pulmonary:     Effort: Pulmonary effort is normal. No respiratory distress.     Breath sounds: Normal breath sounds.  Skin:  General: Skin is warm and dry.     Findings: No rash.  Psychiatric:        Speech: Speech normal.        Behavior: Behavior normal.        Thought Content: Thought content normal.     Diabetic foot exam:  Right calcaneus wound Pre ulcerative  calluses   decreased DP pulses  decreased sensation to light touch and monofilament Nails  thickened     Results for orders placed or performed in visit on  09/07/23  [facility] DIABETES FOOT EXAM   Collection Time: 09/07/23 12:00 AM  Result Value Ref Range   [facility] Diabetic Foot Exam done     This visit occurred during the SARS-CoV-2 public health emergency.  Safety protocols were in place, including screening questions prior to the visit, additional usage of staff PPE, and extensive cleaning of exam room while observing appropriate contact time as indicated for disinfecting solutions.   COVID 19 screen:  No recent travel or known exposure to COVID19 The patient denies respiratory symptoms of COVID 19 at this time. The importance of social distancing was discussed today.   Assessment and Plan [name] preventative maintenance and recommended screening tests for an annual wellness exam were reviewed in full today. Brought up to date unless services declined.  Counselled on the importance of diet, exercise, and its role in overall health and mortality. [name] FH and SH was reviewed, including their home life, tobacco status, and drug and alcohol status.   Refused tetanus vaccine. Consider Shingrix and  COVID booster. Uptodate with eye exam Colonoscopy 2011,.. he refused colonoscopy and cologuard again this year, understands risk. No family history. PSA  negative.   Problem List Items Addressed This Visit     Atrial fibrillation (HCC)   Acute, status post cardioversion. Followed by cardiology.  On anticoagulation       Chronic midline low back pain   Relevant Medications   HYDROcodone -acetaminophen  (NORCO/VICODIN) 5-325 MG tablet   HYDROcodone -acetaminophen  (NORCO/VICODIN) 5-325 MG tablet   HYDROcodone -acetaminophen  (NORCO/VICODIN) 5-325 MG tablet   Coronary artery disease of native artery of native heart with stable angina pectoris (HCC) (Chronic)   Followed closely by cardiology.      Relevant Medications   HYDROcodone -acetaminophen  (NORCO/VICODIN) 5-325 MG tablet   HYDROcodone -acetaminophen  (NORCO/VICODIN) 5-325 MG tablet    HYDROcodone -acetaminophen  (NORCO/VICODIN) 5-325 MG tablet   Diabetes mellitus type 2 with retinopathy (HCC)   Chronic, worsening control   On Trulicity  4.5 mg weekly. Tresiba  and NovoLog   Jardiance  10 mg p.o. daily   Reevaluate in 3 months.      Relevant Orders   Hemoglobin A1c   Lipid panel   Comprehensive metabolic panel   Diabetic retinopathy (HCC) (Chronic)   Set up yearly eye exam for diabetes and have the opthalmologist send us  a copy of the evaluation for the chart.       Diabetic ulcer of heel (HCC)   In setting of diabetes and peripheral artery disease. Currently followed by wound care center.      Hyperlipidemia associated with type 2 diabetes mellitus (HCC) - Primary (Chronic)   Chronic, inadequate control with Zetia .  Would definitely suggest consideration of PCSK9 inhibitor.  If cardiologist not looking into this in the near future I will move forward with it.      Hypertension associated with diabetes (HCC) (Chronic)   Chronic, well-controlled in office today  Coreg  6.25 milligrams p.o. twice daily, losartan  50 mg p.o. daily  Lasix  40 mg daily Spironolactone  25 mg p.o. daily      Major depressive disorder, recurrent episode, moderate (HCC) (Chronic)   Chronic, poor control   Venlafaxine  225 mg daily      PAD (peripheral artery disease) (HCC)   Chronic, followed by vascular, clopidogrel  75 mg p.o. daily         Greig Ring, MD

## 2023-09-07 NOTE — Assessment & Plan Note (Signed)
Chronic, well-controlled in office today  Coreg 6.25 milligrams p.o. twice daily, losartan 50 mg p.o. daily  Lasix 40 mg daily Spironolactone 25 mg p.o. daily

## 2023-09-07 NOTE — Assessment & Plan Note (Signed)
 Chronic, followed by vascular, clopidogrel 75 mg p.o. daily

## 2023-09-10 ENCOUNTER — Other Ambulatory Visit: Payer: Self-pay | Admitting: Family Medicine

## 2023-09-11 ENCOUNTER — Encounter: Payer: PPO | Attending: Physician Assistant | Admitting: Physician Assistant

## 2023-09-11 DIAGNOSIS — E1151 Type 2 diabetes mellitus with diabetic peripheral angiopathy without gangrene: Secondary | ICD-10-CM | POA: Diagnosis not present

## 2023-09-11 DIAGNOSIS — I480 Paroxysmal atrial fibrillation: Secondary | ICD-10-CM | POA: Diagnosis not present

## 2023-09-11 DIAGNOSIS — E1140 Type 2 diabetes mellitus with diabetic neuropathy, unspecified: Secondary | ICD-10-CM | POA: Insufficient documentation

## 2023-09-11 DIAGNOSIS — L97512 Non-pressure chronic ulcer of other part of right foot with fat layer exposed: Secondary | ICD-10-CM | POA: Diagnosis not present

## 2023-09-11 DIAGNOSIS — I110 Hypertensive heart disease with heart failure: Secondary | ICD-10-CM | POA: Diagnosis not present

## 2023-09-11 DIAGNOSIS — Z7901 Long term (current) use of anticoagulants: Secondary | ICD-10-CM | POA: Diagnosis not present

## 2023-09-11 DIAGNOSIS — L97412 Non-pressure chronic ulcer of right heel and midfoot with fat layer exposed: Secondary | ICD-10-CM | POA: Diagnosis not present

## 2023-09-11 DIAGNOSIS — I2510 Atherosclerotic heart disease of native coronary artery without angina pectoris: Secondary | ICD-10-CM | POA: Insufficient documentation

## 2023-09-11 DIAGNOSIS — E11621 Type 2 diabetes mellitus with foot ulcer: Secondary | ICD-10-CM | POA: Diagnosis not present

## 2023-09-12 ENCOUNTER — Other Ambulatory Visit: Payer: Self-pay | Admitting: Cardiovascular Disease

## 2023-09-13 NOTE — Progress Notes (Addendum)
 FILIBERTO, WAMBLE ([id number]) [id number] Page 1 of 10 Visit Report for 09/11/2023 Chief Complaint Document Details Patient Name: Date of Service: [name] [facility] H. 09/11/2023 3:45 PM Medical Record Number: [id number] Patient Account Number: [id number] Date of Birth/Sex: Treating RN: [birth year] (72 y.o. [name]) [name] Primary Care Provider: Avelina No Other Clinician: Purcell Sniff Referring Provider: Treating Provider/Extender: Bethena Andre Avelina, [name] in Treatment: 24 Information Obtained from: Patient Chief Complaint Right foot ulcers Electronic Signature(s) Signed: 09/11/2023 4:04:18 PM By: Bethena Andre PA-C Entered By: Bethena Andre on 09/11/2023 16:04:18 -------------------------------------------------------------------------------- Debridement Details Patient Name: Date of Service: [name] HN H. 09/11/2023 3:45 PM Medical Record Number: [id number] Patient Account Number: [id number] Date of Birth/Sex: Treating RN: 10-10-1950 (72 y.o. [name]) [name] Primary Care Provider: Avelina No Other Clinician: Purcell Sniff Referring Provider: Treating Provider/Extender: Bethena Andre Avelina, [name] in Treatment: 31 Debridement Performed for Assessment: Wound #1R Right Calcaneus Performed By: Physician Bethena Andre, PA-C Debridement Type: Debridement Severity of Tissue Pre Debridement: Fat layer exposed Level of Consciousness (Pre-procedure): Awake and Alert Pre-procedure Verification/Time Out Yes - 16:22 Taken: Start Time: 16:22 Percent of Wound Bed Debrided: 100% T Area Debrided (cm): otal 0.35 Tissue and other material debrided: Viable, Non-Viable, Callus, Slough, Subcutaneous, Slough Level: Skin/Subcutaneous Tissue Debridement Description: Excisional Instrument: Curette Bleeding: Minimum Hemostasis Achieved: Pressure Response to Treatment: Procedure was tolerated well Level of Consciousness (Post- Awake and Alert procedure): [name], [name] ([id number]) [id number].pdf Page 2 of 10 Post Debridement Measurements of Total Wound Length: (cm) 0.9 Width: (cm) 0.5 Depth: (cm) 0.2 Volume: (cm) 0.071 Character of Wound/Ulcer Post Debridement: Stable Severity of Tissue Post Debridement: Fat layer exposed Post Procedure Diagnosis Same as Pre-procedure Electronic Signature(s) Signed: 09/11/2023 4:43:18 PM By: Bethena Andre PA-C Signed: 09/11/2023 4:48:09 PM By: Purcell Sniff Signed: 09/12/2023 2:57:41 PM By: William Sailors RN Entered By: Purcell Sniff on 09/11/2023 16:23:24 -------------------------------------------------------------------------------- Debridement Details Patient Name: Date of Service: [name] HN H. 09/11/2023 3:45 PM Medical Record Number: [id number] Patient Account Number: [id number] Date of Birth/Sex: Treating RN: 16-Mar-1951 (72 y.o. [name]) William, [place] Primary Care Provider: Avelina No Other Clinician: Purcell Sniff Referring Provider: Treating Provider/Extender: Bethena Andre Avelina, [name] in Treatment: 31 Debridement Performed for Assessment: Wound #5 Right T Third oe Performed By: Physician Bethena Andre, PA-C Debridement Type: Debridement Severity of Tissue Pre Debridement: Fat layer exposed Level of Consciousness (Pre-procedure): Awake and Alert Pre-procedure Verification/Time Out Yes - 16:22 Taken: Start Time: 16:22 Percent of Wound Bed Debrided: 100% T Area Debrided (cm): otal 0.28 Tissue and other material debrided: Viable, Non-Viable, Slough, Subcutaneous, Fascia , Slough Level: Skin/Subcutaneous Tissue/Muscle Debridement Description: Excisional Instrument: Curette Bleeding: Minimum Hemostasis Achieved: Pressure Response to Treatment: Procedure was tolerated well Level of Consciousness (Post- Awake and Alert procedure): Post Debridement Measurements of Total Wound Length: (cm) 0.9 Width: (cm) 0.4 Depth: (cm) 0.1 Volume: (cm) 0.028 Character of  Wound/Ulcer Post Debridement: Stable Severity of Tissue Post Debridement: Fat layer exposed Post Procedure Diagnosis Same as Pre-procedure Electronic Signature(s) Signed: 09/11/2023 4:43:18 PM By: Bethena Andre PA-C Signed: 09/11/2023 4:48:09 PM By: Purcell Sniff [name] ([id number]) [id number] Page 3 of 10 Signed: 09/12/2023 2:57:41 PM By: William Sailors RN Entered By: Purcell Sniff on 09/11/2023 16:26:42 -------------------------------------------------------------------------------- HPI Details Patient Name: Date of Service: [name] HN H. 09/11/2023 3:45 PM Medical Record Number: [id number] Patient Account Number: [id number] Date of Birth/Sex: Treating RN: [birth year] (72 y.o. [name]) [name] Primary Care Provider: Avelina No Other Clinician: Purcell  Angie Referring Provider: Treating Provider/Extender: Bethena Andre Ring, [name] in Treatment: 31 History of Present Illness HPI Description: 01-31-2023 upon evaluation today patient appears to be doing poorly currently in regard to wounds on his right heel, right dorsal foot, and right lateral foot. Subsequently he does have known peripheral vascular disease and again this is something that he needs to I think Checked formally. This is what the majority of the conversation today hinged around and the patient voiced understanding as far as that is concerned. Fortunately I do not see any signs of active infection locally nor systemically which is great news. Patient does have a history of diabetes mellitus type 2, atrial fibrillation for which she is on long-term anticoagulant Therapy, hypertension, and coronary artery disease. Patient's hemoglobin A1c most recently was on 11-17-2022 and was 7.2 and currently he is on Eliquis  and Plavix . 02-07-2023 upon evaluation today patient appears to be doing well currently in regard to his wounds all things considered I feel like we are still maintaining that does  not seem like it is any worse is also not significantly better. I discussed with the patient that I do believe he would benefit from the arterial evaluation we still need to get this done as quickly as possible and subsequently we did put in a follow-up call with the vascular office today with regard to this. 02-14-2023 upon evaluation today patient appears to be doing a little better in regard to his wounds which are showing signs of loosening which is great news. With that being said he is experiencing an improvement overall in his symptoms and we are still waiting on the vascular evaluation want to get this result and consider whether or not his blood flow is good or not we will be able to make a better determination of next steps. For now we will try and avoid any aggressive sharp debridement to know that he has good arterial flow. 02-21-2023 upon evaluation today patient appears to be doing about the same in regard to his wound. Fortunately there does not appear to be any signs of active infection at this time which is great news. No fevers, chills, nausea, vomiting, or diarrhea. I did review patient's arterial study and it appears that he has pretty good flow in the left is not perfect but it is decent. On the right however he is definitely not doing nearly as good and I think that he is going to need to see one of the vascular doctors for further evaluation and treatment of this right leg in order to get these wounds to heal. 02-28-2023 upon evaluation today patient appears to be doing well currently in regard to his wound. He has been tolerating the dressing changes without complication. Fortunately I do not see any evidence of active infection locally nor systemically at this time. I do think that the eschar started to soften up and I would like to try to get some of this off to see if we can get things moving in the right direction. He is in agreement with this plan. 03-09-2023 upon evaluation  today patient appears to be doing well currently in regard to his wound. He has been tolerating the dressing changes without complication. Fortunately there does not appear to be any signs of active infection locally nor systemically at this time which is great news. I do believe clearing out some of the necrotic debris last week has helped to a degree. 03-16-2023 upon evaluation today patient appears to be  doing well currently in regard to his wounds. He is going to be having a vascular angiogram in order to open up blood flow and I think this is going to be very beneficial for him. Fortunately I do not see any evidence of active infection locally nor systemically which is great news. 7/25; patient with predominantly ischemic wounds to on his dorsal foot and 1 on the right heel. He is going for his angiogram on Monday. He has been using the Iodoflex to the wound and changing this dressing himself. 04-11-2023 upon evaluation today patient appears to be doing well currently in regard to his wounds. In fact he had the arterial procedure with vascular and the great news is he actually has much improved blood flow he had a 95% blockage of the popliteal as well as the posterior tibial based on the record review. Post procedure he now has a residual of only about 30% in the popliteal and closer around 10% in the posterior tibial. The blood flow is greatly improved and it is obvious based on what we are seeing. 04-20-2023 upon evaluation today patient appears to be doing well currently in regard to his wounds. They are actually showing signs of excellent improvement and to be honest now that he is got good arterial flow going he is really healing quite nicely. Fortunately I do not see any signs of active infection locally or systemically which is great news. 8/29 this is a patient who has known arterial insufficiency. She has 2 wounds on the dorsal right foot and 1 on the back of the heel. She had an angiogram  at the end of July and had follow-up arterial studies yesterday but I cannot pull up the arterial studies. We are using Prisma and Zituvimet and really doing quite well. The wounds are better 05-08-2023 upon evaluation today patient appears to be doing well currently in regard to his ulcers. He has been tolerating the dressing changes without complication. Fortunately there does not appear to be any signs of active infection looking or systemically at this time. 05-29-2023 upon evaluation today patient appears to be doing well currently in regard to his wounds are all pretty healthy appearing. With that being said unfortunately he is having some issues here with to the wounds that were previously pretty much closed having reopened. Either way I think that the wounds [name], [name] ([id number]) [id number] Page 4 of 10 are healthy appearing. 06-06-2023 upon evaluation today patient's wounds appear to be doing well though the areas actually appear to be a little bit irritated due to what appears to be drainage from the Band-Aids. I think we need to go back to doing the Zetuvit and wrapping as opposed to using the Band-Aids. 06-13-2023 upon evaluation today patient appears to be doing excellent in regard to all 3 wound locations. He has been tolerating the dressing changes without complication and in general I believe that he is making excellent headway towards closure which is great news. No fevers, chills, nausea, vomiting, or diarrhea. 06-22-2023 upon evaluation today patient actually appears to be making good headway towards closure I am actually very pleased with where we stand today. I do not see any signs of active infection locally or systemically which is great news and in general I do think that we are making good headway towards complete closure which is great news. 06-30-2023 upon evaluation today patient appears to be doing well currently in regard to his wounds they  are not getting quite  a small sound like to see they are getting a little smaller here and there at set for the 1 on the distal foot right side which actually showing signs of plateauing. Working to see how things continue to move but if it does not significantly improved by next week I may consider looking into Apligraf for him as a possibility. The patient is in agreement with that plan. 07-11-2023 upon evaluation today patient appears to be doing okay currently in regard to his wounds although the wound on the top of his foot approximately is actually larger I think is because is rubbing on his shoe. I believe that he may do better to switch out for different shoes. I recommended that tennis shoes in particular would probably be the best way to go. 07-20-2023 upon evaluation today patient appears to be doing well currently in regard to the wounds on his right foot. This is actually the best that have seen them in quite some time. Fortunately I do not see any evidence of worsening at this point with regard to the wounds and I think that he is making good headway here towards closure. 08-01-2023 upon evaluation today patient appears to be doing well currently in regard to his wound. He has been tolerating the dressing changes without complication. His wounds in general seem to be doing better although he does have some areas that are showing signs of still having some issue with healing that he has a new wound on the toe that he did have previous. 08-10-2023 upon evaluation today patient's wounds are in some areas getting better in other areas it is doing about the same or just slightly better in 1 spot is healed which is on the dorsum of the foot. Fortunately I do not see any signs of infection at any site although did require debridement. 09-11-2023 upon evaluation today patient appears to be doing well currently in regard to his wounds. The top of the foot wounds actually both appear to  be completely healed which is great news. Fortunately I do not see any signs of infection at this time there. With that being said he does have a wound on the right heel which is still open unfortunately he tells me that he bumped it and reopened it causing it to bleed again over the weekend. With that being said he also has a wound on the right third toe. This is very dry and he needs to probably be having something along the lines of Xeroform on this to keep it from drying out too much. Electronic Signature(s) Signed: 09/11/2023 4:28:47 PM By: Bethena Ferraris PA-C Entered By: Bethena Ferraris on 09/11/2023 16:28:46 -------------------------------------------------------------------------------- Physical Exam Details Patient Name: Date of Service: [name] HN H. 09/11/2023 3:45 PM Medical Record Number: [id number] Patient Account Number: [id number] Date of Birth/Sex: Treating RN: 11-Oct-1950 (72 y.o. [name] Primary Care Provider: Avelina No Other Clinician: Purcell Sniff Referring Provider: Treating Provider/Extender: Bethena Ferraris Avelina, [name] in Treatment: 26 Constitutional Well-nourished and well-hydrated in no acute distress. Respiratory normal breathing without difficulty. Psychiatric this patient is able to make decisions and demonstrates good insight into disease process. Alert and Oriented x 3. pleasant and cooperative. Notes Upon inspection patient's wound bed actually showed signs of good granulation epithelization at this point. Fortunately I do not see any evidence of worsening I believe that the patient is making headway here towards closure. No fevers, chills, nausea, vomiting, or diarrhea. Electronic Signature(s) Signed: 09/11/2023 4:29:03 PM By:  Bethena Ferraris [place], [place] H ([id number]) [id number] Page 5 of 10 Entered By: Bethena Ferraris on 09/11/2023  16:29:03 -------------------------------------------------------------------------------- Physician Orders Details Patient Name: Date of Service: [name] 09/11/2023 3:45 PM Medical Record Number: [id number] Patient Account Number: [id number] Date of Birth/Sex: Treating RN: Nov 02, 1950 (72 y.o. [name]) [name] Primary Care Provider: Avelina No Other Clinician: Purcell Sniff Referring Provider: Treating Provider/Extender: Bethena Ferraris Avelina, [name] in Treatment: 65 The following information was scribed by: Purcell Sniff The information was scribed for: Bethena Ferraris Verbal / Phone Orders: No Diagnosis Coding ICD-10 Coding Code Description E11.621 Type 2 diabetes mellitus with foot ulcer L97.512 Non-pressure chronic ulcer of other part of right foot with fat layer exposed I25.10 Atherosclerotic heart disease of native coronary artery without angina pectoris I48.0 Paroxysmal atrial fibrillation Z79.01 Long term (current) use of anticoagulants I10 Essential (primary) hypertension Follow-up Appointments Return Appointment in 1 week. Bathing/ [employer] wounds with antibacterial soap and water. Anesthetic (Use 'Patient Medications' Section for Anesthetic Order Entry) Lidocaine  applied to wound bed Edema Control - Orders / Instructions Elevate, Exercise Daily and A void Standing for Long Periods of Time. Elevate legs to the level of the heart and pump ankles as often as possible Elevate leg(s) parallel to the floor when sitting. Wound Treatment Wound #1R - Calcaneus Wound Laterality: Right Cleanser: Byram Ancillary Kit - 15 Day Supply (Generic) 3 x Per Week/30 Days Discharge Instructions: Use supplies as instructed; Kit contains: (15) Saline Bullets; (15) 3x3 Gauze; 15 pr Gloves Cleanser: Soap and Water 3 x Per Week/30 Days Discharge Instructions: Gently cleanse wound with antibacterial soap, rinse and pat dry prior to dressing wounds Prim Dressing: Hydrofera  Blue Ready Transfer Foam, 2.5x2.5 (in/in) (Dispense As Written) 3 x Per Week/30 Days ary Discharge Instructions: Apply Hydrofera Blue Ready to wound bed as directed Secondary Dressing: (BORDER) Zetuvit Plus SILICONE BORDER Dressing 4x4 (in/in) (Dispense As Written) 3 x Per Week/30 Days Discharge Instructions: Please do not put silicone bordered dressings under wraps. Use non-bordered dressing only. Wound #5 - T Third oe Wound Laterality: Right Cleanser: Byram Ancillary Kit - 15 Day Supply (Dispense As Written) 3 x Per Week/30 Days Discharge Instructions: Use supplies as instructed; Kit contains: (15) Saline Bullets; (15) 3x3 Gauze; 15 pr Gloves Cleanser: Soap and Water 3 x Per Week/30 Days Discharge Instructions: Gently cleanse wound with antibacterial soap, rinse and pat dry prior to dressing wounds Prim Dressing: Xeroform-HBD 2x2 (in/in) ary 3 x Per Week/30 Days [name], [name] ([id number]) [phone].pdf Page 6 of 10 Discharge Instructions: Apply Xeroform-HBD 2x2 (in/in) as directed Secondary Dressing: Coverlet Latex-Free Fabric Adhesive Dressings 3 x Per Week/30 Days Discharge Instructions: [place]uckle Electronic Signature(s) Signed: 09/11/2023 4:43:18 PM By: Bethena Ferraris PA-C Signed: 09/11/2023 4:48:09 PM By: Purcell Sniff Entered By: Purcell Sniff on 09/11/2023 16:27:50 -------------------------------------------------------------------------------- Problem List Details Patient Name: Date of Service: [name] HN H. 09/11/2023 3:45 PM Medical Record Number: [id number] Patient Account Number: [id number] Date of Birth/Sex: Treating RN: March 22, 1951 (72 y.o. [name] Primary Care Provider: Avelina No Other Clinician: Purcell Sniff Referring Provider: Treating Provider/Extender: Bethena Ferraris Avelina, [name] in Treatment: 61 Active Problems ICD-10 Encounter Code Description Active Date MDM Diagnosis E11.621 Type 2 diabetes mellitus with foot ulcer  01/31/2023 No Yes L97.512 Non-pressure chronic ulcer of other part of right foot with fat layer exposed 01/31/2023 No Yes I25.10 Atherosclerotic heart disease of native coronary artery without angina pectoris 01/31/2023 No Yes I48.0 Paroxysmal atrial fibrillation 01/31/2023  No Yes Z79.01 Long term (current) use of anticoagulants 01/31/2023 No Yes I10 Essential (primary) hypertension 01/31/2023 No Yes Inactive Problems Resolved Problems Electronic Signature(s) Signed: 09/11/2023 4:04:16 PM By: Bethena Ferraris PA-C Entered By: Bethena Ferraris on 09/11/2023 16:04:16 [place], NORLEEN [name] ([id number]) [id number] Page 7 of 10 -------------------------------------------------------------------------------- Progress Note Details Patient Name: Date of Service: [name] 09/11/2023 3:45 PM Medical Record Number: [id number] Patient Account Number: [id number] Date of Birth/Sex: Treating RN: [birth year] (72 y.o. [name]) [name] Primary Care Provider: Avelina No Other Clinician: Purcell Sniff Referring Provider: Treating Provider/Extender: Bethena Ferraris Avelina, [name] in Treatment: 63 Subjective Chief Complaint Information obtained from Patient Right foot ulcers History of Present Illness (HPI) 01-31-2023 upon evaluation today patient appears to be doing poorly currently in regard to wounds on his right heel, right dorsal foot, and right lateral foot. Subsequently he does have known peripheral vascular disease and again this is something that he needs to I think Checked formally. This is what the majority of the conversation today hinged around and the patient voiced understanding as far as that is concerned. Fortunately I do not see any signs of active infection locally nor systemically which is great news. Patient does have a history of diabetes mellitus type 2, atrial fibrillation for which she is on long-term anticoagulant Therapy, hypertension, and coronary artery disease. Patient's  hemoglobin A1c most recently was on 11-17-2022 and was 7.2 and currently he is on Eliquis  and Plavix . 02-07-2023 upon evaluation today patient appears to be doing well currently in regard to his wounds all things considered I feel like we are still maintaining that does not seem like it is any worse is also not significantly better. I discussed with the patient that I do believe he would benefit from the arterial evaluation we still need to get this done as quickly as possible and subsequently we did put in a follow-up call with the vascular office today with regard to this. 02-14-2023 upon evaluation today patient appears to be doing a little better in regard to his wounds which are showing signs of loosening which is great news. With that being said he is experiencing an improvement overall in his symptoms and we are still waiting on the vascular evaluation want to get this result and consider whether or not his blood flow is good or not we will be able to make a better determination of next steps. For now we will try and avoid any aggressive sharp debridement to know that he has good arterial flow. 02-21-2023 upon evaluation today patient appears to be doing about the same in regard to his wound. Fortunately there does not appear to be any signs of active infection at this time which is great news. No fevers, chills, nausea, vomiting, or diarrhea. I did review patient's arterial study and it appears that he has pretty good flow in the left is not perfect but it is decent. On the right however he is definitely not doing nearly as good and I think that he is going to need to see one of the vascular doctors for further evaluation and treatment of this right leg in order to get these wounds to heal. 02-28-2023 upon evaluation today patient appears to be doing well currently in regard to his wound. He has been tolerating the dressing changes without complication. Fortunately I do not see any evidence of active  infection locally nor systemically at this time. I do think that the eschar started to soften up and  I would like to try to get some of this off to see if we can get things moving in the right direction. He is in agreement with this plan. 03-09-2023 upon evaluation today patient appears to be doing well currently in regard to his wound. He has been tolerating the dressing changes without complication. Fortunately there does not appear to be any signs of active infection locally nor systemically at this time which is great news. I do believe clearing out some of the necrotic debris last week has helped to a degree. 03-16-2023 upon evaluation today patient appears to be doing well currently in regard to his wounds. He is going to be having a vascular angiogram in order to open up blood flow and I think this is going to be very beneficial for him. Fortunately I do not see any evidence of active infection locally nor systemically which is great news. 7/25; patient with predominantly ischemic wounds to on his dorsal foot and 1 on the right heel. He is going for his angiogram on Monday. He has been using the Iodoflex to the wound and changing this dressing himself. 04-11-2023 upon evaluation today patient appears to be doing well currently in regard to his wounds. In fact he had the arterial procedure with vascular and the great news is he actually has much improved blood flow he had a 95% blockage of the popliteal as well as the posterior tibial based on the record review. Post procedure he now has a residual of only about 30% in the popliteal and closer around 10% in the posterior tibial. The blood flow is greatly improved and it is obvious based on what we are seeing. 04-20-2023 upon evaluation today patient appears to be doing well currently in regard to his wounds. They are actually showing signs of excellent improvement and to be honest now that he is got good arterial flow going he is really healing  quite nicely. Fortunately I do not see any signs of active infection locally or systemically which is great news. 8/29 this is a patient who has known arterial insufficiency. She has 2 wounds on the dorsal right foot and 1 on the back of the heel. She had an angiogram at the end of July and had follow-up arterial studies yesterday but I cannot pull up the arterial studies. We are using Prisma and Zituvimet and really doing quite well. The wounds are better 05-08-2023 upon evaluation today patient appears to be doing well currently in regard to his ulcers. He has been tolerating the dressing changes without complication. Fortunately there does not appear to be any signs of active infection looking or systemically at this time. 05-29-2023 upon evaluation today patient appears to be doing well currently in regard to his wounds are all pretty healthy appearing. With that being said unfortunately he is having some issues here with to the wounds that were previously pretty much closed having reopened. Either way I think that the wounds ROSENDO, COUSER ([id number]) [id number] Page 8 of 10 are healthy appearing. 06-06-2023 upon evaluation today patient's wounds appear to be doing well though the areas actually appear to be a little bit irritated due to what appears to be drainage from the Band-Aids. I think we need to go back to doing the Zetuvit and wrapping as opposed to using the Band-Aids. 06-13-2023 upon evaluation today patient appears to be doing excellent in regard to all 3 wound locations. He has been tolerating the dressing changes without complication and in general  I believe that he is making excellent headway towards closure which is great news. No fevers, chills, nausea, vomiting, or diarrhea. 06-22-2023 upon evaluation today patient actually appears to be making good headway towards closure I am actually very pleased with where we stand today. I do not see any signs of  active infection locally or systemically which is great news and in general I do think that we are making good headway towards complete closure which is great news. 06-30-2023 upon evaluation today patient appears to be doing well currently in regard to his wounds they are not getting quite a small sound like to see they are getting a little smaller here and there at set for the 1 on the distal foot right side which actually showing signs of plateauing. Working to see how things continue to move but if it does not significantly improved by next week I may consider looking into Apligraf for him as a possibility. The patient is in agreement with that plan. 07-11-2023 upon evaluation today patient appears to be doing okay currently in regard to his wounds although the wound on the top of his foot approximately is actually larger I think is because is rubbing on his shoe. I believe that he may do better to switch out for different shoes. I recommended that tennis shoes in particular would probably be the best way to go. 07-20-2023 upon evaluation today patient appears to be doing well currently in regard to the wounds on his right foot. This is actually the best that have seen them in quite some time. Fortunately I do not see any evidence of worsening at this point with regard to the wounds and I think that he is making good headway here towards closure. 08-01-2023 upon evaluation today patient appears to be doing well currently in regard to his wound. He has been tolerating the dressing changes without complication. His wounds in general seem to be doing better although he does have some areas that are showing signs of still having some issue with healing that he has a new wound on the toe that he did have previous. 08-10-2023 upon evaluation today patient's wounds are in some areas getting better in other areas it is doing about the same or just slightly better in 1 spot is healed which is on the dorsum  of the foot. Fortunately I do not see any signs of infection at any site although did require debridement. 09-11-2023 upon evaluation today patient appears to be doing well currently in regard to his wounds. The top of the foot wounds actually both appear to be completely healed which is great news. Fortunately I do not see any signs of infection at this time there. With that being said he does have a wound on the right heel which is still open unfortunately he tells me that he bumped it and reopened it causing it to bleed again over the weekend. With that being said he also has a wound on the right third toe. This is very dry and he needs to probably be having something along the lines of Xeroform on this to keep it from drying out too much. Objective Constitutional Well-nourished and well-hydrated in no acute distress. Vitals Time Taken: 3:59 PM, Height: 66 in, Weight: 250 lbs, BMI: 40.3, Temperature: 97.9 F, Pulse: 81 bpm, Respiratory Rate: 18 breaths/min, Blood Pressure: 138/63 mmHg. Respiratory normal breathing without difficulty. Psychiatric this patient is able to make decisions and demonstrates good insight into disease process. Alert and  Oriented x 3. pleasant and cooperative. General Notes: Upon inspection patient's wound bed actually showed signs of good granulation epithelization at this point. Fortunately I do not see any evidence of worsening I believe that the patient is making headway here towards closure. No fevers, chills, nausea, vomiting, or diarrhea. Integumentary (Hair, Skin) Wound #1R status is Open. Original cause of wound was Gradually Appeared. The date acquired was: 12/10/2022. The wound has been in treatment 31 weeks. The wound is located on the Right Calcaneus. The wound measures 0.9cm length x 0.5cm width x 0.2cm depth; 0.353cm^2 area and 0.071cm^3 volume. There is Fat Layer (Subcutaneous Tissue) exposed. There is a medium amount of serosanguineous drainage noted.  There is medium (34-66%) red granulation within the wound bed. There is a medium (34-66%) amount of necrotic tissue within the wound bed including Adherent Slough. Wound #3 status is Healed - Epithelialized. Original cause of wound was Gradually Appeared. The date acquired was: 12/10/2022. The wound has been in treatment 31 weeks. The wound is located on the Right,Proximal,Dorsal Foot. The wound measures 0cm length x 0cm width x 0cm depth; 0cm^2 area and 0cm^3 volume. There is Fat Layer (Subcutaneous Tissue) exposed. There is a none present amount of drainage noted. There is no granulation within the wound bed. There is no necrotic tissue within the wound bed. Wound #5 status is Open. Original cause of wound was Blister. The date acquired was: 08/01/2023. The wound has been in treatment 5 weeks. The wound is located on the Right T Third. The wound measures 0.9cm length x 0.4cm width x 0.1cm depth; 0.283cm^2 area and 0.028cm^3 volume. There is Fat Layer oe (Subcutaneous Tissue) exposed. There is a medium amount of serosanguineous drainage noted. There is medium (34-66%) pink granulation within the wound bed. There is a medium (34-66%) amount of necrotic tissue within the wound bed including Adherent Slough. 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 There was a Excisional Skin/Subcutaneous Tissue Debridement with a total area of 0.35  sq cm performed by Bethena Ferraris, PA-C. With the following instrument(s): Curette to remove Viable and Non-Viable tissue/material. Material removed includes Callus, Subcutaneous Tissue, and Slough. A time out was conducted at 16:22, prior to the start of the procedure. A Minimum amount of bleeding was controlled with Pressure. The procedure was tolerated well. Post Debridement Measurements: 0.9cm length x 0.5cm width x 0.2cm depth; 0.071cm^3 volume. Character of Wound/Ulcer Post Debridement is stable. Severity of Tissue Post Debridement is: Fat layer exposed. Post procedure Diagnosis Wound #1R: Same as Pre-Procedure Wound #5 Pre-procedure diagnosis of Wound #5 is a Diabetic Wound/Ulcer of the Lower Extremity located on the Right T Third .Severity of Tissue Pre Debridement is: oe Fat layer exposed. There was a Excisional Skin/Subcutaneous Tissue/Muscle Debridement with a total area of 0.28 sq cm performed by Bethena Ferraris, PA-C. With the following instrument(s): Curette to remove Viable and Non-Viable tissue/material. Material removed includes Subcutaneous Tissue, Slough, and Fascia. A time out was conducted at 16:22, prior to the start of the procedure. A Minimum amount of bleeding  was controlled with Pressure. The procedure was tolerated well. Post Debridement Measurements: 0.9cm length x 0.4cm width x 0.1cm depth; 0.028cm^3 volume. Character of Wound/Ulcer Post Debridement is stable. Severity of Tissue Post Debridement is: Fat layer exposed. Post procedure Diagnosis Wound #5: Same as Pre-Procedure Plan Follow-up Appointments: Return Appointment in 1 week. Bathing/ Shower/ Hygiene: Wash wounds with antibacterial soap and water. Anesthetic (Use 'Patient Medications' Section for Anesthetic Order Entry): Lidocaine  applied to wound bed Edema Control - Orders / Instructions: Elevate, Exercise Daily and Avoid Standing for Long Periods of Time. Elevate legs to the level of the heart and pump ankles as  often as possible Elevate leg(s) parallel to the floor when sitting. WOUND #1R: - Calcaneus Wound Laterality: Right Cleanser: Byram Ancillary Kit - 15 Day Supply (Generic) 3 x Per Week/30 Days Discharge Instructions: Use supplies as instructed; Kit contains: (15) Saline Bullets; (15) 3x3 Gauze; 15 pr Gloves Cleanser: Soap and Water 3 x Per Week/30 Days Discharge Instructions: Gently cleanse wound with antibacterial soap, rinse and pat dry prior to dressing wounds Prim Dressing: Hydrofera Blue Ready Transfer Foam, 2.5x2.5 (in/in) (Dispense As Written) 3 x Per Week/30 Days ary Discharge Instructions: Apply Hydrofera Blue Ready to wound bed as directed Secondary Dressing: (BORDER) Zetuvit Plus SILICONE BORDER Dressing 4x4 (in/in) (Dispense As Written) 3 x Per Week/30 Days Discharge Instructions: Please do not put silicone bordered dressings under wraps. Use non-bordered dressing only. WOUND #5: - T Third Wound Laterality: Right oe Cleanser: Byram Ancillary Kit - 15 Day Supply (Dispense As Written) 3 x Per Week/30 Days Discharge Instructions: Use supplies as instructed; Kit contains: (15) Saline Bullets; (15) 3x3 Gauze; 15 pr Gloves Cleanser: Soap and Water 3 x Per Week/30 Days Discharge Instructions: Gently cleanse wound with antibacterial soap, rinse and pat dry prior to dressing wounds Prim Dressing: Xeroform-HBD 2x2 (in/in) 3 x Per Week/30 Days ary Discharge Instructions: Apply Xeroform-HBD 2x2 (in/in) as directed Secondary Dressing: Coverlet Latex-Free Fabric Adhesive Dressings 3 x Per Week/30 Days Discharge Instructions: Knuckle 1. I am going to recommend that we have the patient continue to monitor for any signs of infection or worsening. Based on what I see I do believe that he is continuing with the Hydrofera Blue for the heel I think this is good. 2. I would recommend that we continue with the Xeroform for the toe I think this is good to be the best thing. 3. I am also can recommend  that he should continue to avoid any excessive friction or pressure to any of the areas this is gena be of utmost importance for getting all this to heal appropriately. We will see patient back for reevaluation in 1 week here in the clinic. If anything worsens or changes patient will contact our office for additional recommendations. Electronic Signature(s) Signed: 09/11/2023 4:30:01 PM By: Bethena Ferraris PA-C Entered By: Bethena Ferraris on 09/11/2023 16:30:00 [name] ([id number]) [id number] Page 10 of 10 -------------------------------------------------------------------------------- SuperBill Details Patient Name: Date of Service: [name] 09/11/2023 Medical Record Number: [id number] Patient Account Number: [id number] Date of Birth/Sex: Treating RN: 26-Dec-1950 (72 y.o. [name]) [name] Primary Care Provider: Avelina No Other Clinician: Purcell Sniff Referring Provider: Treating Provider/Extender: Bethena Ferraris Avelina, [name] in Treatment: 31 Diagnosis Coding ICD-10 Codes Code Description E11.621 Type 2 diabetes mellitus with foot ulcer L97.512 Non-pressure chronic ulcer of other part of right foot with fat layer exposed I25.10 Atherosclerotic heart disease of native coronary artery without angina pectoris I48.0 Paroxysmal atrial fibrillation  Z79.01 Long term (current) use of anticoagulants I10 Essential (primary) hypertension Facility Procedures : CPT4 Code: 63899987 Description: 11042 - DEB SUBQ TISSUE 20 SQ CM/< ICD-10 Diagnosis Description L97.512 Non-pressure chronic ulcer of other part of right foot with fat layer exposed Modifier: Quantity: 1 : CPT4 Code: 63899985 Description: 11043 - DEB MUSC/FASCIA 20 SQ CM/< ICD-10 Diagnosis Description L97.512 Non-pressure chronic ulcer of other part of right foot with fat layer exposed Modifier: Quantity: 1 Physician Procedures : CPT4 Code Description Modifier 11042 11042 - WC PHYS SUBQ TISS  20 SQ CM ICD-10 Diagnosis Description L97.512 Non-pressure chronic ulcer of other part of right foot with fat layer exposed Quantity: 1 : [id number] 11043 - WC PHYS DEBR MUSCLE/FASCIA 20 SQ CM ICD-10 Diagnosis Description L97.512 Non-pressure chronic ulcer of other part of right foot with fat layer exposed Quantity: 1 Electronic Signature(s) Signed: 09/11/2023 4:34:17 PM By: Bethena Ferraris PA-C Entered By: Bethena Ferraris on 09/11/2023 16:34:16

## 2023-09-13 NOTE — Progress Notes (Signed)
 IRL, BODIE ([id number]) [id number] Page 1 of 10 Visit Report for 09/11/2023 Arrival Information Details Patient Name: Date of Service: [name] [facility] [name]. 09/11/2023 3:45 PM Medical Record Number: [id number] Patient Account Number: [id number] Date of Birth/Sex: Treating RN: [birth year] (72 y.o. [name]) [name] Primary Care [name]: [name] No Other Clinician: Purcell [name] Referring [name]: Treating [name]/Extender: [name] [name] in Treatment: 31 Visit Information History Since Last Visit All ordered tests and consults were completed: No Patient Arrived: [name] Added or deleted any medications: No Arrival Time: 15:55 Any new allergies or adverse reactions: No Transfer Assistance: None Had a fall or experienced change in No Patient Identification Verified: Yes activities of daily living that may affect Secondary Verification Process Completed: Yes risk of falls: Patient Requires Transmission-Based Precautions: No Signs or symptoms of abuse/neglect since last visito No Patient Has Alerts: Yes Hospitalized since last visit: No Patient Alerts: Patient on Blood Thinner Implantable device outside of the clinic excluding No Diabetes type 2 cellular tissue based products placed in the center Eliquis /Plavix  since last visit: ABI R 1.23 TBI 0.57 Has Dressing in Place as Prescribed: Yes ABI L 0.98 TBI 0.80 Pain Present Now: No Electronic Signature(s) Signed: 09/11/2023 4:48:09 PM By: [name] Entered By: [name] on 09/11/2023 15:59:35 -------------------------------------------------------------------------------- Clinic Level of Care Assessment Details Patient Name: Date of Service: [name] [name] 09/11/2023 3:45 PM Medical Record Number: [id number] Patient Account Number: [id number] Date of Birth/Sex: Treating RN: [birth year] (72 y.o. [name]) [name] Primary Care [name]: [name] No Other Clinician: Purcell [name] Referring [name]: Treating [name]/Extender: [name] [name] in Treatment: 31 Clinic Level of Care Assessment Items TOOL 1 Quantity Score []  - 0 Use when EandM and Procedure is performed on INITIAL visit ASSESSMENTS - Nursing Assessment / Reassessment []  - 0 General Physical Exam (combine w/ comprehensive assessment (listed just below) when performed on new pt. evals) []  - 0 Comprehensive Assessment (HX, ROS, Risk Assessments, Wounds Hx, etc.) [name], [name] ([id number]) [id number] Page 2 of 10 ASSESSMENTS - Wound and Skin Assessment / Reassessment []  - 0 Dermatologic / Skin Assessment (not related to wound area) ASSESSMENTS - Ostomy and/or Continence Assessment and Care []  - 0 Incontinence Assessment and Management []  - 0 Ostomy Care Assessment and Management (repouching, etc.) PROCESS - Coordination of Care []  - 0 Simple Patient / Family Education for ongoing care []  - 0 Complex (extensive) Patient / Family Education for ongoing care []  - 0 Staff obtains Chiropractor, Records, T Results / Process Orders est []  - 0 Staff telephones HHA, Nursing Homes / Clarify orders / etc []  - 0 Routine Transfer to another Facility (non-emergent condition) []  - 0 Routine Hospital Admission (non-emergent condition) []  - 0 New Admissions / Manufacturing Engineer / Ordering NPWT Apligraf, etc. , []  - 0 Emergency Hospital Admission (emergent condition) PROCESS - Special Needs []  - 0 Pediatric / Minor Patient Management []  - 0 Isolation Patient Management []  - 0 Hearing / Language / Visual special needs []  - 0 Assessment of Community assistance (transportation, D/C planning, etc.) []  - 0 Additional assistance / Altered mentation []  - 0 Support Surface(s) Assessment (bed, cushion, seat, etc.) INTERVENTIONS - Miscellaneous []  - 0 External ear exam []  - 0 Patient Transfer (multiple staff / Nurse, Adult / Similar devices) []  -  0 Simple Staple / Suture removal (25 or less) []  - 0 Complex Staple / Suture removal (26 or more) []  - 0 Hypo/Hyperglycemic Management (do not check  if billed separately) []  - 0 Ankle / Brachial Index (ABI) - do not check if billed separately Has the patient been seen at the hospital within the last three years: Yes Total Score: 0 Level Of Care: ____ Electronic Signature(s) Signed: 09/11/2023 4:48:09 PM By: [name] Entered By: [name] on 09/11/2023 16:28:12 -------------------------------------------------------------------------------- Encounter Discharge Information Details Patient Name: Date of Service: [name] [name] 09/11/2023 3:45 PM Medical Record Number: [id number] Patient Account Number: [id number] Date of Birth/Sex: Treating RN: 03-28-1951 (72 y.o. [name] [name] Primary Care Aymen Widrig: [name] No Other Clinician: Purcell [name] Referring [name]: Treating [name]/Extender: William, [name] ([id number]) [id number] Page 3 of 10 [name] in Treatment: 31 Encounter Discharge Information Items Post Procedure Vitals Discharge Condition: Stable Temperature (F): 97.9 Ambulatory Status: Cane Pulse (bpm): 81 Discharge Destination: Home Respiratory Rate (breaths/min): 18 Transportation: Private Auto Blood Pressure (mmHg): 138/63 Accompanied By: self Schedule Follow-up Appointment: Yes Clinical Summary of Care: Electronic Signature(s) Signed: 09/11/2023 4:48:09 PM By: [name] Entered By: [name] on 09/11/2023 16:44:40 -------------------------------------------------------------------------------- Lower Extremity Assessment Details Patient Name: Date of Service: [name] [name] 09/11/2023 3:45 PM Medical Record Number: [id number] Patient Account Number: [id number] Date of Birth/Sex: Treating RN: 1950-10-11 (72 y.o. [name]) [name] Primary Care Dixie Coppa: Avelina No Other Clinician: Purcell [name] Referring [name]: Treating Shaterica Mcclatchy/Extender: [name] [name] in Treatment: 31 Electronic Signature(s) Signed: 09/11/2023 4:48:09 PM By: [name] Signed: 09/12/2023 2:57:41 PM By: William Sailors RN Entered By: [name] on 09/11/2023 16:10:56 -------------------------------------------------------------------------------- Multi Wound Chart Details Patient Name: Date of Service: [name] [name] 09/11/2023 3:45 PM Medical Record Number: [id number] Patient Account Number: [id number] Date of Birth/Sex: Treating RN: 21-Mar-1951 (72 y.o. [name] [name] Primary Care Alekzander Cardell: [name] No Other Clinician: Purcell [name] Referring Joeli Fenner: Treating Maicey Barrientez/Extender: [name] [name] in Treatment: 31 Vital Signs Height(in): 66 Pulse(bpm): 81 Weight(lbs): 250 Blood Pressure(mmHg): 138/63 Body Mass Index(BMI): 40.3 Temperature(F): 97.9 Respiratory Rate(breaths/min): 18 [[place], Saabir [name] ([id number]):Photos:] [id number].pdf Page 4 of 10:1R 3 5] Right Calcaneus Right, Proximal, Dorsal Foot Right T Third oe Wound Location: Gradually Appeared Gradually Appeared Blister Wounding Event: Diabetic Wound/Ulcer of the Lower Diabetic Wound/Ulcer of the Lower Diabetic Wound/Ulcer of the Lower Primary Etiology: Extremity Extremity Extremity Congestive Heart Failure, Coronary Congestive Heart Failure, Coronary Congestive Heart Failure, Coronary Comorbid History: Artery Disease, Hypertension, Type II Artery Disease, Hypertension, Type II Artery Disease, Hypertension, Type II Diabetes, Neuropathy Diabetes, Neuropathy Diabetes, Neuropathy 12/10/2022 12/10/2022 08/01/2023 Date Acquired: 31 31 5  [name] of Treatment: Open Open Open Wound Status: No No No Wound Recurrence: Yes No No Clustered Wound: 0.9x0.5x0.2 0.1x0.1x0.1 0.9x0.4x0.1 Measurements L x W x D (cm) 0.353 0.008 0.283 A (cm) : rea 0.071 0.001 0.028 Volume (cm)  : 91.70% 99.70% 26.50% % Reduction in A rea: 83.40% 99.60% 26.30% % Reduction in Volume: Grade 1 Grade 1 Grade 2 Classification: Medium None Present Medium Exudate A mount: Serosanguineous N/A Serosanguineous Exudate Type: red, brown N/A red, brown Exudate Color: Medium (34-66%) None Present (0%) Medium (34-66%) Granulation A mount: Red N/A Pink Granulation Quality: Medium (34-66%) None Present (0%) Medium (34-66%) Necrotic A mount: Fat Layer (Subcutaneous Tissue): Yes Fat Layer (Subcutaneous Tissue): Yes Fat Layer (Subcutaneous Tissue): Yes Exposed Structures: Fascia: No Fascia: No Fascia: No Tendon: No Tendon: No Tendon: No Muscle: No Muscle: No Muscle: No Joint: No Joint: No Joint: No Bone: No Bone: No Bone: No Medium (34-66%) Large (67-100%) None Epithelialization: Treatment Notes Electronic  Signature(s) Signed: 09/11/2023 4:48:09 PM By: [name] Entered By: [name] on 09/11/2023 16:11:00 -------------------------------------------------------------------------------- Multi-Disciplinary Care Plan Details Patient Name: Date of Service: [name] [name] 09/11/2023 3:45 PM Medical Record Number: [id number] Patient Account Number: [id number] Date of Birth/Sex: Treating RN: [birth year] (72 y.o. [name]) [name] Primary Care Laikyn Gewirtz: [name] No Other Clinician: Purcell [name] Referring Breuna Loveall: Treating Aran Menning/Extender: [name] [name] in Treatment: 59 Active Inactive Electronic Signature(s) Signed: 09/11/2023 4:48:09 PM By: [name] [name] ([id number]) [id number] Page 5 of 10 Signed: 09/12/2023 2:57:41 PM By: William Sailors RN Entered By: [name] on 09/11/2023 16:28:24 -------------------------------------------------------------------------------- Pain Assessment Details Patient Name: Date of Service: [name] [name] 09/11/2023 3:45 PM Medical Record Number: [id number] Patient  Account Number: [id number] Date of Birth/Sex: Treating RN: 03/06/1951 (72 y.o. [name]) [name] Primary Care Inanna Telford: [name] No Other Clinician: Purcell [name] Referring Cloee Dunwoody: Treating Emile Kyllo/Extender: [name] [name] in Treatment: 31 Active Problems Location of Pain Severity and Description of Pain Patient Has Paino No Site Locations Pain Management and Medication Current Pain Management: Electronic Signature(s) Signed: 09/11/2023 4:05:20 PM By: William Sailors RN Signed: 09/11/2023 4:48:09 PM By: [name] Entered By: [name] on 09/11/2023 16:02:38 -------------------------------------------------------------------------------- Patient/Caregiver Education Details Patient Name: Date of Service: [name] [name] 1/13/2025andnbsp3:45 PM Medical Record Number: [id number] Patient Account Number: [id number] [name], [name] ([id number]) [id number] Page 6 of 10 Date of Birth/Gender: Treating RN: 1951-02-21 (72 y.o. [name]) [name] Primary Care Physician: [name] No Other Clinician: Purcell [name] Referring Physician: Treating Physician/Extender: [name] [name] in Treatment: 89 Education Assessment Education Provided To: Patient Education Topics Provided Wound/Skin Impairment: Handouts: Other: continue wound care as directed Methods: Explain/Verbal Responses: State content correctly Electronic Signature(s) Signed: 09/11/2023 4:48:09 PM By: [name] Entered By: [name] on 09/11/2023 16:41:36 -------------------------------------------------------------------------------- Wound Assessment Details Patient Name: Date of Service: [name] [name] 09/11/2023 3:45 PM Medical Record Number: [id number] Patient Account Number: [id number] Date of Birth/Sex: Treating RN: 05-26-1951 (72 y.o. [name]) [name] Primary Care Dellamae Rosamilia: [name] No Other Clinician: Purcell [name] Referring Taiquan Campanaro: Treating  Sadao Weyer/Extender: [name] [name] in Treatment: 31 Wound Status Wound Number: 1R Primary Diabetic Wound/Ulcer of the Lower Extremity Etiology: Wound Location: Right Calcaneus Wound Open Wounding Event: Gradually Appeared Status: Date Acquired: 12/10/2022 Comorbid Congestive Heart Failure, Coronary Artery Disease, [name] Of Treatment: 31 History: Hypertension, Type II Diabetes, Neuropathy Clustered Wound: Yes Photos Wound Measurements Length: (cm) 0.9 Width: (cm) 0.5 Depth: (cm) 0.2 Area: (cm) 0.3 Volume: (cm) 0.0 [name], [name] ([id number]) % Reduction in Area: 91.7% % Reduction in Volume: 83.4% Epithelialization: Medium (34-66%) 53 71 [id number] Page 7 of 10 Wound Description Classification: Grade 1 Exudate Amount: Medium Exudate Type: Serosanguineous Exudate Color: red, brown Foul Odor After Cleansing: No Slough/Fibrino Yes Wound Bed Granulation Amount: Medium (34-66%) Exposed Structure Granulation Quality: Red Fascia Exposed: No Necrotic Amount: Medium (34-66%) Fat Layer (Subcutaneous Tissue) Exposed: Yes Necrotic Quality: Adherent Slough Tendon Exposed: No Muscle Exposed: No Joint Exposed: No Bone Exposed: No Treatment Notes Wound #1R (Calcaneus) Wound Laterality: Right Cleanser Byram Ancillary Kit - 15 Day Supply Discharge Instruction: Use supplies as instructed; Kit contains: (15) Saline Bullets; (15) 3x3 Gauze; 15 pr Gloves Soap and Water Discharge Instruction: Gently cleanse wound with antibacterial soap, rinse and pat dry prior to dressing wounds Peri-Wound Care Topical Primary Dressing Hydrofera Blue Ready Transfer Foam, 2.5x2.5 (in/in) Discharge Instruction: Apply Hydrofera Blue Ready to wound bed  as directed Secondary Dressing (BORDER) Zetuvit Plus SILICONE BORDER Dressing 4x4 (in/in) Discharge Instruction: Please do not put silicone bordered dressings under wraps. Use non-bordered dressing only. Secured  With Compression Wrap Compression Stockings Add-Ons Electronic Signature(s) Signed: 09/11/2023 4:48:09 PM By: [name] Signed: 09/12/2023 2:57:41 PM By: William Sailors RN Entered By: [name] on 09/11/2023 16:09:41 -------------------------------------------------------------------------------- Wound Assessment Details Patient Name: Date of Service: [name] [name] 09/11/2023 3:45 PM Medical Record Number: [id number] Patient Account Number: [id number] Date of Birth/Sex: Treating RN: 02/02/1951 (72 y.o. [name] [name] Primary Care [name]: [name] No Other Clinician: Purcell [name] Referring Zoe Goonan: Treating Roena Sassaman/Extender: [name] [name] in Treatment: 31 Wound Status Wound Number: 3 Primary Diabetic Wound/Ulcer of the Lower Extremity Etiology: [name], [name] ([id number]) [id number].pdf Page 8 of 10 Etiology: Wound Location: Right, Proximal, Dorsal Foot Wound Healed - Epithelialized Wounding Event: Gradually Appeared Status: Date Acquired: 12/10/2022 Comorbid Congestive Heart Failure, Coronary Artery Disease, [name] Of Treatment: 31 History: Hypertension, Type II Diabetes, Neuropathy Clustered Wound: No Photos Wound Measurements Length: (cm) Width: (cm) Depth: (cm) Area: (cm) Volume: (cm) 0 % Reduction in Area: 100% 0 % Reduction in Volume: 100% 0 Epithelialization: Large (67-100%) 0 0 Wound Description Classification: Grade 1 Exudate Amount: None Present Foul Odor After Cleansing: No Slough/Fibrino No Wound Bed Granulation Amount: None Present (0%) Exposed Structure Necrotic Amount: None Present (0%) Fascia Exposed: No Fat Layer (Subcutaneous Tissue) Exposed: Yes Tendon Exposed: No Muscle Exposed: No Joint Exposed: No Bone Exposed: No Treatment Notes Wound #3 (Foot) Wound Laterality: Dorsal, Right, Proximal Cleanser Peri-Wound Care Topical Primary Dressing Secondary Dressing Secured  With Compression Wrap Compression Stockings Add-Ons Electronic Signature(s) Signed: 09/11/2023 4:48:09 PM By: [name] Signed: 09/12/2023 2:57:41 PM By: William Sailors RN Entered By: [name] on 09/11/2023 16:21:58 [name] ([id number]) [id number] Page 9 of 10 -------------------------------------------------------------------------------- Wound Assessment Details Patient Name: Date of Service: [name] [name] 09/11/2023 3:45 PM Medical Record Number: [id number] Patient Account Number: [id number] Date of Birth/Sex: Treating RN: 05-21-1951 (72 y.o. [name]) [name] Primary Care Gerene Nedd: [name] No Other Clinician: Purcell [name] Referring Normagene Harvie: Treating Brewster Wolters/Extender: [name] [name] in Treatment: 31 Wound Status Wound Number: 5 Primary Diabetic Wound/Ulcer of the Lower Extremity Etiology: Wound Location: Right T Third oe Wound Open Wounding Event: Blister Status: Date Acquired: 08/01/2023 Comorbid Congestive Heart Failure, Coronary Artery Disease, [name] Of Treatment: 5 History: Hypertension, Type II Diabetes, Neuropathy Clustered Wound: No Photos Wound Measurements Length: (cm) 0.9 Width: (cm) 0.4 Depth: (cm) 0.1 Area: (cm) 0.283 Volume: (cm) 0.028 % Reduction in Area: 26.5% % Reduction in Volume: 26.3% Epithelialization: None Wound Description Classification: Grade 2 Exudate Amount: Medium Exudate Type: Serosanguineous Exudate Color: red, brown Foul Odor After Cleansing: No Slough/Fibrino Yes Wound Bed Granulation Amount: Medium (34-66%) Exposed Structure Granulation Quality: Pink Fascia Exposed: No Necrotic Amount: Medium (34-66%) Fat Layer (Subcutaneous Tissue) Exposed: Yes Necrotic Quality: Adherent Slough Tendon Exposed: No Muscle Exposed: No Joint Exposed: No Bone Exposed: No Treatment Notes Wound #5 (Toe Third) Wound Laterality: Right Cleanser Byram Ancillary Kit - 15 Day  Supply Discharge Instruction: Use supplies as instructed; Kit contains: (15) Saline Bullets; (15) 3x3 Gauze; 15 pr [address] JAKYE, MULLENS [name] ([id number]) [id number] Page 10 of 10 Discharge Instruction: Gently cleanse wound with antibacterial soap, rinse and pat dry prior to dressing wounds Peri-Wound Care Topical Primary Dressing Xeroform-HBD 2x2 (in/in) Discharge Instruction: Apply Xeroform-HBD 2x2 (in/in) as directed Secondary Dressing Coverlet Latex-Free Fabric Adhesive  Dressings Discharge Instruction: Knuckle Secured With Compression Wrap Compression Stockings Add-Ons Electronic Signature(s) Signed: 09/11/2023 4:48:09 PM By: [name] Signed: 09/12/2023 2:57:41 PM By: William Sailors RN Entered By: [name] on 09/11/2023 16:10:42 -------------------------------------------------------------------------------- Vitals Details Patient Name: Date of Service: [name] [name] 09/11/2023 3:45 PM Medical Record Number: [id number] Patient Account Number: [id number] Date of Birth/Sex: Treating RN: 1951/05/15 (72 y.o. [name]) [name] Primary Care Bertine Schlottman: [name] No Other Clinician: Purcell [name] Referring Eliany Mccarter: Treating Adhvik Canady/Extender: [name] [name] in Treatment: 31 Vital Signs Time Taken: 15:59 Temperature (F): 97.9 Height (in): 66 Pulse (bpm): 81 Weight (lbs): 250 Respiratory Rate (breaths/min): 18 Body Mass Index (BMI): 40.3 Blood Pressure (mmHg): 138/63 Reference Range: 80 - 120 mg / dl Electronic Signature(s) Signed: 09/11/2023 4:48:09 PM By: [name] Entered By: [name] on 09/11/2023 16:02:34

## 2023-09-19 ENCOUNTER — Ambulatory Visit: Payer: PPO | Admitting: Physician Assistant

## 2023-09-25 ENCOUNTER — Other Ambulatory Visit: Payer: Self-pay | Admitting: *Deleted

## 2023-09-25 NOTE — Progress Notes (Unsigned)
Samples of Eliquis were never picked up by patient.   Eliquis 5 mg 4 boxes 14 tablets per box and Total of 56 tablets LOT# [id number] EXP# SEP 2025  Returned unopened boxes back into our supply.

## 2023-09-26 ENCOUNTER — Encounter: Payer: PPO | Admitting: Physician Assistant

## 2023-09-26 DIAGNOSIS — E11621 Type 2 diabetes mellitus with foot ulcer: Secondary | ICD-10-CM | POA: Diagnosis not present

## 2023-09-26 DIAGNOSIS — L97412 Non-pressure chronic ulcer of right heel and midfoot with fat layer exposed: Secondary | ICD-10-CM | POA: Diagnosis not present

## 2023-09-26 DIAGNOSIS — I110 Hypertensive heart disease with heart failure: Secondary | ICD-10-CM | POA: Diagnosis not present

## 2023-09-26 DIAGNOSIS — L97512 Non-pressure chronic ulcer of other part of right foot with fat layer exposed: Secondary | ICD-10-CM | POA: Diagnosis not present

## 2023-10-03 ENCOUNTER — Encounter: Payer: PPO | Attending: Physician Assistant | Admitting: Physician Assistant

## 2023-10-03 DIAGNOSIS — I480 Paroxysmal atrial fibrillation: Secondary | ICD-10-CM | POA: Insufficient documentation

## 2023-10-03 DIAGNOSIS — L97512 Non-pressure chronic ulcer of other part of right foot with fat layer exposed: Secondary | ICD-10-CM | POA: Insufficient documentation

## 2023-10-03 DIAGNOSIS — L97513 Non-pressure chronic ulcer of other part of right foot with necrosis of muscle: Secondary | ICD-10-CM | POA: Insufficient documentation

## 2023-10-03 DIAGNOSIS — Z7901 Long term (current) use of anticoagulants: Secondary | ICD-10-CM | POA: Diagnosis not present

## 2023-10-03 DIAGNOSIS — L97412 Non-pressure chronic ulcer of right heel and midfoot with fat layer exposed: Secondary | ICD-10-CM | POA: Insufficient documentation

## 2023-10-03 DIAGNOSIS — I2510 Atherosclerotic heart disease of native coronary artery without angina pectoris: Secondary | ICD-10-CM | POA: Insufficient documentation

## 2023-10-03 DIAGNOSIS — E11621 Type 2 diabetes mellitus with foot ulcer: Secondary | ICD-10-CM | POA: Diagnosis not present

## 2023-10-03 DIAGNOSIS — Z7902 Long term (current) use of antithrombotics/antiplatelets: Secondary | ICD-10-CM | POA: Insufficient documentation

## 2023-10-03 DIAGNOSIS — I10 Essential (primary) hypertension: Secondary | ICD-10-CM | POA: Insufficient documentation

## 2023-10-12 ENCOUNTER — Other Ambulatory Visit: Payer: Self-pay | Admitting: Family

## 2023-10-12 ENCOUNTER — Encounter: Payer: PPO | Admitting: Physician Assistant

## 2023-10-12 ENCOUNTER — Ambulatory Visit: Payer: PPO | Admitting: Physician Assistant

## 2023-10-12 ENCOUNTER — Other Ambulatory Visit: Payer: Self-pay | Admitting: Family Medicine

## 2023-10-12 DIAGNOSIS — E11621 Type 2 diabetes mellitus with foot ulcer: Secondary | ICD-10-CM | POA: Diagnosis not present

## 2023-10-12 DIAGNOSIS — L97512 Non-pressure chronic ulcer of other part of right foot with fat layer exposed: Secondary | ICD-10-CM | POA: Diagnosis not present

## 2023-10-12 DIAGNOSIS — L97412 Non-pressure chronic ulcer of right heel and midfoot with fat layer exposed: Secondary | ICD-10-CM | POA: Diagnosis not present

## 2023-10-16 DIAGNOSIS — E11621 Type 2 diabetes mellitus with foot ulcer: Secondary | ICD-10-CM | POA: Diagnosis not present

## 2023-10-20 ENCOUNTER — Telehealth: Payer: Self-pay | Admitting: Family

## 2023-10-20 NOTE — Telephone Encounter (Signed)
Pt confirmed appt for 10/23/23

## 2023-10-20 NOTE — Progress Notes (Unsigned)
 Advanced Heart Failure Clinic Note   PCP: [name], MD (last seen 01/25) Primary Cardiologist: Julien Nordmann, MD (last seen 10/24)  Chief Complaint: fatigue  HPI:  [name] is a 73 y/o male with a history of NSTEMI 04/17 (PCI to LAD; was discharged on Plavix) and again 02/2016 (no PCI; was discharged on Brilinta), Again presented to [facility] ER on 04/05/16 for NSTEMI (no PCI), chronic total occlusion of RCA, DM, hyperlipidemia, HTN, CKD, depression and chronic heart failure. Due to paroxysmal atrial fibrillation, was successfully cardioverted 03/03/23.   Admitted 11/05/22 due to nausea, vomiting and dyspnea for the last 3 days. Noted to have fever and tested covid +. CTA negative for PE but showed heart failure. 1 dose of IV lasix given. Started on remdesivir, Decadron and bronchodilators.   Echo 03/09/16: EF 55-60% with Grade II DD  LHC 04/06/16:  Mid RCA to Dist RCA lesion, 100 %stenosed. Mid LAD-2 lesion, 60 %stenosed. Mid LAD-1 lesion, 20 %stenosed. The left ventricular systolic function is normal. The left ventricular ejection fraction is 50-55% by visual estimate. LV end diastolic pressure is normal. 2nd [name] lesion, 75 %stenosed.  Chronic total occlusion of the right coronary in the proximal segment well collateralized from the left circumflex and LAD. Widely patent proximal to mid LAD stent previously placed in July. There is first diagonal diagonal and LAD 30 and 50% narrowing respectively. Widely patent circumflex unchanged from previous with 70% narrowing in a small branch of the first marginal. Circumflex collateralizes the distal right coronary left ventricular branch. Inferobasal hypokinesis. EF 50%. EDP is normal.  Echo 04/01/20: EF 60-65% with mild LVH, Grade I DD, normal PA pressure, mild LAE/RAE Echo 11/06/22: EF 40-45% along with mild/moderate William  He presents today, with his daughter, for a HF f/u visit with a chief complaint of moderate fatigue. Says that he's tired "all  the time" and sometimes doesn't feel like leaving the house. Has minimal shortness of breath along with this. Denies chest pain, palpitations, abdominal distention, pedal edema, dizziness or difficulty sleeping. He's unsure if he snores or not. Has noticed a gradual weight gain because he hasn't been as active lately due to the cold weather.   Mentions that his eliquis is ~ $47/ month and is wondering if there's any assistance that he could qualify for.   ROS: All systems negative except as listed in HPI, PMH and Problem List.  SH:  Social History   Socioeconomic History   Marital status: Widowed    Spouse name: Not on file   Number of children: 3   Years of education: Not on file   Highest education level: 7th grade  Occupational History   Occupation: disabled    Associate Professor: UNEMPLOYED    Comment: back injury  Tobacco Use   Smoking status: Never   Smokeless tobacco: Never  Vaping Use   Vaping status: Never Used  Substance and Sexual Activity   Alcohol use: No    Alcohol/week: 0.0 [name] drinks of alcohol   Drug use: No   Sexual activity: Not Currently  Other Topics Concern   Not on file  Social History Narrative   Has a roommate, [name]. No pets.   Social Drivers of Corporate investment banker Strain: Low Risk  (11/23/2022)   Overall Financial Resource Strain (CARDIA)    Difficulty of Paying Living Expenses: Not hard at all  Food Insecurity: No Food Insecurity (12/29/2022)   Hunger Vital Sign    Worried About Running  Out of Food in the Last Year: Never true    Ran Out of Food in the Last Year: Never true  Transportation Needs: No Transportation Needs (12/29/2022)   PRAPARE - Administrator, Civil Service (Medical): No    Lack of Transportation (Non-Medical): No  Physical Activity: Inactive (11/09/2022)   Exercise Vital Sign    Days of Exercise per Week: 0 days    Minutes of Exercise per Session: 0 min  Stress: No Stress Concern Present (11/09/2022)    [employer] of Occupational Health - Occupational Stress Questionnaire    Feeling of Stress : Only a little  Social Connections: Moderately Isolated (11/09/2022)   Social Connection and Isolation Panel [NHANES]    Frequency of Communication with Friends and Family: More than three times a week    Frequency of Social Gatherings with Friends and Family: More than three times a week    Attends Religious Services: More than 4 times per year    Active Member of [employer] or Organizations: No    Attends Banker Meetings: Never    Marital Status: Widowed  Intimate Partner Violence: Not At Risk (11/09/2022)   Humiliation, Afraid, Rape, and Kick questionnaire    Fear of Current or Ex-Partner: No    Emotionally Abused: No    Physically Abused: No    Sexually Abused: No    FH:  Family History  Problem Relation Age of Onset   Alzheimer's disease Mother    Emphysema Mother    Diabetes Father    Heart disease Father        MI   Cancer Brother        ? Neck cancer    Past Medical History:  Diagnosis Date   Back injury 02/2002   worker's comp   CHF (congestive heart failure) (HCC)    Coronary artery disease, non-occlusive    a. cath 2002 with no sig CAD;  b. cath 2008 normal LM, LAD, LCx, p&dRCA 20-30%, PDA 30%; c.11/2015 NSTEMI/PCI: LM nl, LAD 95p (2.5x15 Xience DES), LCX nl, RCA 100p/m w/ L->R collats, EF 55-65% c. NSTEMI (02/2016) with no culprit leision, switched to Brilinta.  d. NSTEMI 03/2016: again, no culprit lesion and switched back to plavix 2/2 SOB with Brilnta.     Depression    Diabetes mellitus type 2, insulin dependent (HCC)    Hyperlipemia    Hypertension    Hypertensive heart disease    Kidney stones    Macular degeneration    Morbid obesity (HCC)    Osteoarthritis    Snoring     Current Outpatient Medications  Medication Sig Dispense Refill   Acetaminophen (TYLENOL PO) Take 3-4 tablets by mouth as needed (pain).     apixaban (ELIQUIS) 5 MG TABS tablet  Take 1 tablet (5 mg total) by mouth 2 (two) times daily. 60 tablet 5   carvedilol (COREG) 6.25 MG tablet Take 1 tablet (6.25 mg total) by mouth 2 (two) times daily with a meal. 90 tablet 3   cholecalciferol (VITAMIN D3) 25 MCG (1000 UNIT) tablet Take 2,000 Units by mouth daily.     clopidogrel (PLAVIX) 75 MG tablet TAKE ONE TABLET BY MOUTH ONCE DAILY WITH BREAKFAST 90 tablet 3   Continuous Blood Gluc Sensor (FREESTYLE LIBRE 2 SENSOR) MISC APPLY SENSOR EVERY 14 DAYS TO MONITOR SUGAR CONTINOUSLY 2 each 11   Continuous Glucose Receiver (FREESTYLE LIBRE 2 READER) DEVI USE WITH SENSORS TO MONITOR SUGAR CONTINUOUSLY 1 each 0  Dulaglutide (TRULICITY) 4.5 MG/0.5ML SOAJ INJECT 4.5MG  (0.5ML) UNDER THE SKIN ONCE A WEEK 6 mL 3   ENTRESTO 24-26 MG TAKE ONE TABLET BY MOUTH TWO TIMES DAILY. 60 tablet 3   ezetimibe (ZETIA) 10 MG tablet Take 1 tablet (10 mg total) by mouth daily. 90 tablet 3   furosemide (LASIX) 40 MG tablet Take 40 mg by mouth daily.     gabapentin (NEURONTIN) 100 MG capsule Take 1 capsule (100 mg total) by mouth at bedtime. 30 capsule 3   HYDROcodone-acetaminophen (NORCO/VICODIN) 5-325 MG tablet Take 1 tablet by mouth 3 (three) times daily as needed for moderate pain (pain score 4-6). 90 tablet 0   HYDROcodone-acetaminophen (NORCO/VICODIN) 5-325 MG tablet Take 1 tablet by mouth 3 (three) times daily as needed for moderate pain (pain score 4-6). 90 tablet 0   HYDROcodone-acetaminophen (NORCO/VICODIN) 5-325 MG tablet Take 1 tablet by mouth 3 (three) times daily as needed for moderate pain (pain score 4-6). 90 tablet 0   hydrOXYzine (ATARAX) 10 MG tablet TAKE 1 TABLET BY MOUTH 3 TIMES DAILY AS NEEDED FOR ANXIETY 30 tablet 2   insulin aspart (NOVOLOG FLEXPEN) 100 UNIT/ML FlexPen INJECT 13 UNITS UNDER THE SKIN EVERY MORNING AND THREE UNITS AS NEEDED IN THE EVENING. 15 mL 2   isosorbide mononitrate (IMDUR) 30 MG 24 hr tablet TAKE ONE TABLET BY MOUTH TWICE DAILY 180 tablet 1   JARDIANCE 10 MG TABS  tablet TAKE ONE TABLET (10 MG TOTAL) BY MOUTH DAILY. 30 tablet 5   nitroGLYCERIN (NITROSTAT) 0.4 MG SL tablet DISSOLVE 1 TABLET UNDER TONGUE AS NEEDEDFOR CHEST PAIN. MAY REPEAT 5 MINUTES APART 3 TIMES IF NEEDED 25 tablet 3   spironolactone (ALDACTONE) 25 MG tablet TAKE ONE TABLET (25 MG TOTAL) BY MOUTH DAILY. 30 tablet 5   TRESIBA FLEXTOUCH 100 UNIT/ML FlexTouch Pen INJECT 50 UNITS INTO THE SKIN DAILY. 15 mL 2   TRUEPLUS 5-BEVEL PEN NEEDLES 31G X 6 MM MISC USE TO INJECT INSULIN THREE TIMES A DAY 300 each 3   venlafaxine XR (EFFEXOR-XR) 150 MG 24 hr capsule TAKE ONE CAPSULE BY MOUTH DAILY WITH BREAKFAST. TAKE WITH EFFEXOR XR 75MG  FOR A TOTAL OF 225MG  90 capsule 1   venlafaxine XR (EFFEXOR-XR) 75 MG 24 hr capsule TAKE ONE CAPSULE BY MOUTH ONCE DAILY. TAKE IN ADDITION TO THE 150 MG CAPSULE FOR A TOTAL DOSE OF 225MG  DAILY 90 capsule 1   vitamin B-12 (CYANOCOBALAMIN) 1000 MCG tablet Take 1,000 mcg by mouth daily.     No current facility-administered medications for this visit.   Vitals:   10/23/23 1542  BP: 110/66  Pulse: 75  SpO2: 99%  Weight: 269 lb 3.2 oz (122.1 kg)  Height: 5\' 6"  (1.676 m)   Wt Readings from Last 3 Encounters:  10/23/23 269 lb 3.2 oz (122.1 kg)  09/07/23 262 lb 8 oz (119.1 kg)  07/17/23 252 lb (114.3 kg)   Lab Results  Component Value Date   CREATININE 1.04 09/05/2023   CREATININE 1.15 05/18/2023   CREATININE 1.16 05/09/2023  .  PHYSICAL EXAM:  General: Well appearing. No resp difficulty HEENT: normal Neck: supple, no JVD Cor: Regular rhythm, rate. No rubs, gallops or murmurs Lungs: clear Abdomen: soft, nontender, nondistended. Extremities: no cyanosis, clubbing, rash, edema Neuro: alert & oriented X 3. Moves all 4 extremities w/o difficulty. Affect pleasant   ECG: not done   ASSESSMENT & PLAN:  1: Ischemic cardiomyopathy with reduced ejection fraction- - NYHA class II - euvolemic today - weighing  daily; reminded to call for an overnight weight gain  of > 2 pounds or a weekly weight gain of > 5 pounds - weight up 13 pounds from last visit here 4 months ago; encouraged him to increase his activity - Echo 03/09/16: EF 55-60% with Grade II DD - Echo 04/01/20: EF 60-65% with mild LVH, Grade IDD, normal PA pressure, mild LAE/RAE - Echo 11/06/22: EF 40-45% along with mild/moderate William - NAS but does like to eat out at [employer] - continue carvedilol 6.25mg  BID - continue jardiance 10mg  daily - continue furosemide 40mg  daily - continue entresto 24/26mg  BID; consider titrating in the future although current BP may not tolerate titration - continue spironolactone 25mg  daily - discharged from paramedicine program 11/24 - BNP 11/05/22 was 1240.5  2: HTN- - BP 110/66 - saw PCP Ermalene Searing) 01/25 - BMP 09/05/23 reviewed and showed sodium 139, potassium 4.1, creatinine 1.04 & GFR 71.08  3: Diabetes- - A1c 09/05/23 reviewed and was 9.3% - reviewed eating small amounts more frequently so he doesn't get the highs and lows  4: CAD- - saw cardiology Mariah Milling) 10/24 - continue zetia 10mg  daily - LHC 04/06/16:  Mid RCA to Dist RCA lesion, 100 %stenosed. Mid LAD-2 lesion, 60 %stenosed. Mid LAD-1 lesion, 20 %stenosed. The left ventricular systolic function is normal. The left ventricular ejection fraction is 50-55% by visual estimate. LV end diastolic pressure is normal. 2nd [name] lesion, 75 %stenosed.  Chronic total occlusion of the right coronary in the proximal segment well collateralized from the left circumflex and LAD.  5: Atrial fibrillation- - cardioverted 03/03/23 - continue apixaban 5mg  BID - reached out to HF pharm and she said he has to spend 3% of his income on medications before qualifying for patient assistance - they can reach out to medicare regarding medicare payment plan to spread the cost out; daughter will look into this  6: PAD- - saw vascular Manson Passey) 08/24 - continue clopidogrel 75mg  daily - goes to the wound center - had right lower  leg aortogram and angioplasty of right posterior tibial artery and right popliteal artery on 03/27/23  7: Fatigue- - reports being tired all the time - wakes up tired - he's unsure if he snores or not - will order Itamar home sleep study to rule out sleep apnea   Return in 6 months, sooner if needed.   Delma Freeze, FNP 10/20/23

## 2023-10-23 ENCOUNTER — Ambulatory Visit: Payer: PPO | Attending: Family | Admitting: Family

## 2023-10-23 ENCOUNTER — Other Ambulatory Visit (HOSPITAL_COMMUNITY): Payer: Self-pay

## 2023-10-23 ENCOUNTER — Other Ambulatory Visit: Payer: Self-pay | Admitting: Family Medicine

## 2023-10-23 ENCOUNTER — Encounter: Payer: Self-pay | Admitting: Family

## 2023-10-23 ENCOUNTER — Ambulatory Visit: Payer: PPO | Admitting: Physician Assistant

## 2023-10-23 VITALS — BP 110/66 | HR 75 | Ht 66.0 in | Wt 269.2 lb

## 2023-10-23 DIAGNOSIS — Z794 Long term (current) use of insulin: Secondary | ICD-10-CM | POA: Diagnosis not present

## 2023-10-23 DIAGNOSIS — Z7985 Long-term (current) use of injectable non-insulin antidiabetic drugs: Secondary | ICD-10-CM | POA: Insufficient documentation

## 2023-10-23 DIAGNOSIS — I10 Essential (primary) hypertension: Secondary | ICD-10-CM | POA: Diagnosis not present

## 2023-10-23 DIAGNOSIS — R5383 Other fatigue: Secondary | ICD-10-CM | POA: Diagnosis not present

## 2023-10-23 DIAGNOSIS — I4819 Other persistent atrial fibrillation: Secondary | ICD-10-CM | POA: Diagnosis not present

## 2023-10-23 DIAGNOSIS — Z7984 Long term (current) use of oral hypoglycemic drugs: Secondary | ICD-10-CM | POA: Diagnosis not present

## 2023-10-23 DIAGNOSIS — Z955 Presence of coronary angioplasty implant and graft: Secondary | ICD-10-CM | POA: Insufficient documentation

## 2023-10-23 DIAGNOSIS — I5022 Chronic systolic (congestive) heart failure: Secondary | ICD-10-CM | POA: Insufficient documentation

## 2023-10-23 DIAGNOSIS — F32A Depression, unspecified: Secondary | ICD-10-CM | POA: Diagnosis not present

## 2023-10-23 DIAGNOSIS — E1122 Type 2 diabetes mellitus with diabetic chronic kidney disease: Secondary | ICD-10-CM | POA: Insufficient documentation

## 2023-10-23 DIAGNOSIS — I739 Peripheral vascular disease, unspecified: Secondary | ICD-10-CM

## 2023-10-23 DIAGNOSIS — I255 Ischemic cardiomyopathy: Secondary | ICD-10-CM | POA: Insufficient documentation

## 2023-10-23 DIAGNOSIS — I480 Paroxysmal atrial fibrillation: Secondary | ICD-10-CM | POA: Insufficient documentation

## 2023-10-23 DIAGNOSIS — I252 Old myocardial infarction: Secondary | ICD-10-CM | POA: Diagnosis not present

## 2023-10-23 DIAGNOSIS — Z7902 Long term (current) use of antithrombotics/antiplatelets: Secondary | ICD-10-CM | POA: Diagnosis not present

## 2023-10-23 DIAGNOSIS — I130 Hypertensive heart and chronic kidney disease with heart failure and stage 1 through stage 4 chronic kidney disease, or unspecified chronic kidney disease: Secondary | ICD-10-CM | POA: Insufficient documentation

## 2023-10-23 DIAGNOSIS — E11319 Type 2 diabetes mellitus with unspecified diabetic retinopathy without macular edema: Secondary | ICD-10-CM | POA: Diagnosis not present

## 2023-10-23 DIAGNOSIS — N189 Chronic kidney disease, unspecified: Secondary | ICD-10-CM | POA: Insufficient documentation

## 2023-10-23 DIAGNOSIS — E1151 Type 2 diabetes mellitus with diabetic peripheral angiopathy without gangrene: Secondary | ICD-10-CM | POA: Insufficient documentation

## 2023-10-23 DIAGNOSIS — I2510 Atherosclerotic heart disease of native coronary artery without angina pectoris: Secondary | ICD-10-CM

## 2023-10-23 DIAGNOSIS — I5020 Unspecified systolic (congestive) heart failure: Secondary | ICD-10-CM | POA: Diagnosis not present

## 2023-10-23 NOTE — Progress Notes (Signed)
 ITAMAR home sleep study given to patient, all instructions explained, waiver signed, and CLOUDPAT registration complete.

## 2023-10-23 NOTE — Patient Instructions (Signed)
 Medication Changes:    Lab Work:    Testing/Procedures:  Your provider has recommended that you have a home sleep study (Itamar Test).  We have provided you with the equipment in our office today. Please go ahead and download the app. DO NOT OPEN OR TAMPER WITH THE BOX UNTIL WE ADVISE YOU TO DO SO. Once insurance has approved the test our office will call you with PIN number and approval to proceed with testing. Once you have completed the test you just dispose of the equipment, the information is automatically uploaded to [place] via blue-tooth technology. If your test is positive for sleep apnea and you need a home CPAP machine you will be contacted by Dr Norris Cross office [facility]) to set this up.   Follow-Up in:   At the Advanced [facility] Failure Clinic, you and your health needs are our priority. We have a designated team specialized in the treatment of Heart Failure. This Care Team includes your primary Heart Failure Specialized Cardiologist (physician), Advanced Practice Providers (APPs- Physician Assistants and Nurse Practitioners), and Pharmacist who all work together to provide you with the care you need, when you need it.   You may see any of the following providers on your designated Care Team at your next follow up:  Dr. Arvilla Meres Dr. Marca Ancona Dr. Dorthula Nettles Dr. Theresia Bough Tonye Becket, NP Robbie Lis, [place] [facility] [place], [place] Brynda Peon, NP [place] Lee, NP Karle Plumber, PharmD   Please be sure to bring in all your medications bottles to every appointment.   Need to Contact us:  If you have any questions or concerns before your next appointment please send [place] a message through [place] or call our office at [phone].    TO LEAVE A MESSAGE FOR THE NURSE SELECT OPTION 2, PLEASE LEAVE A MESSAGE INCLUDING: YOUR NAME DATE OF BIRTH CALL BACK NUMBER REASON FOR CALL**this is important as we prioritize the call backs  YOU WILL RECEIVE A  CALL BACK THE SAME DAY AS LONG AS YOU CALL BEFORE 4:00 PM

## 2023-10-23 NOTE — Progress Notes (Signed)
 Height:     Weight: BMI:  Today's Date:  STOP BANG RISK ASSESSMENT S (snore) Have you been told that you snore?     YES   T (tired) Are you often tired, fatigued, or sleepy during the day?   YES  O (obstruction) Do you stop breathing, choke, or gasp during sleep? NO   P (pressure) Do you have or are you being treated for high blood pressure? NO   B (BMI) Is your body index greater than 35 kg/m? YES   A (age) Are you 74 years old or older? YES   N (neck) Do you have a neck circumference greater than 16 inches?   NO   G (gender) Are you a male? YES   TOTAL STOP/BANG "YES" ANSWERS 5                                                                       For Office Use Only              Procedure Order Form    YES to 3+ Stop Bang questions OR two clinical symptoms - patient qualifies for WatchPAT (CPT 95800)      Clinical Notes: Will consult Sleep Specialist and refer for management of therapy due to patient increased risk of Sleep Apnea. Ordering a sleep study due to the following two clinical symptoms: Excessive daytime sleepiness G47.10 / Gastroesophageal reflux K21.9 / Nocturia R35.1 / Morning Headaches G44.221 / Difficulty concentrating R41.840 / Memory problems or poor judgment G31.84 / Personality changes or irritability R45.4 / Loud snoring R06.83 / Depression F32.9 / Unrefreshed by sleep G47.8 / Impotence N52.9 / History of high blood pressure R03.0 / Insomnia G47.00

## 2023-10-23 NOTE — Telephone Encounter (Signed)
 Last office visit 09/07/2023 for CPE.  Last refilled 05/25/2023 for #30 with 3 refills.  Next Appt: 12/07/2023 for DM.

## 2023-10-24 DIAGNOSIS — H3582 Retinal ischemia: Secondary | ICD-10-CM | POA: Diagnosis not present

## 2023-10-24 DIAGNOSIS — H35372 Puckering of macula, left eye: Secondary | ICD-10-CM | POA: Diagnosis not present

## 2023-10-24 DIAGNOSIS — H35033 Hypertensive retinopathy, bilateral: Secondary | ICD-10-CM | POA: Diagnosis not present

## 2023-10-24 DIAGNOSIS — E113393 Type 2 diabetes mellitus with moderate nonproliferative diabetic retinopathy without macular edema, bilateral: Secondary | ICD-10-CM | POA: Diagnosis not present

## 2023-10-24 DIAGNOSIS — H34813 Central retinal vein occlusion, bilateral, with macular edema: Secondary | ICD-10-CM | POA: Diagnosis not present

## 2023-10-24 DIAGNOSIS — H3563 Retinal hemorrhage, bilateral: Secondary | ICD-10-CM | POA: Diagnosis not present

## 2023-10-24 LAB — HM DIABETES EYE EXAM

## 2023-10-25 ENCOUNTER — Other Ambulatory Visit: Payer: Self-pay | Admitting: Family Medicine

## 2023-10-26 ENCOUNTER — Ambulatory Visit
Admission: RE | Admit: 2023-10-26 | Discharge: 2023-10-26 | Disposition: A | Discharge status: Home / Self Care | Payer: PPO | Source: Ambulatory Visit | Attending: Physician Assistant | Admitting: Physician Assistant

## 2023-10-26 ENCOUNTER — Encounter: Payer: PPO | Admitting: Physician Assistant

## 2023-10-26 ENCOUNTER — Other Ambulatory Visit: Payer: Self-pay | Admitting: Physician Assistant

## 2023-10-26 DIAGNOSIS — L97519 Non-pressure chronic ulcer of other part of right foot with unspecified severity: Secondary | ICD-10-CM | POA: Diagnosis not present

## 2023-10-26 DIAGNOSIS — M869 Osteomyelitis, unspecified: Secondary | ICD-10-CM | POA: Insufficient documentation

## 2023-10-26 DIAGNOSIS — M2011 Hallux valgus (acquired), right foot: Secondary | ICD-10-CM | POA: Diagnosis not present

## 2023-10-26 DIAGNOSIS — E11621 Type 2 diabetes mellitus with foot ulcer: Secondary | ICD-10-CM | POA: Diagnosis not present

## 2023-10-26 DIAGNOSIS — L97515 Non-pressure chronic ulcer of other part of right foot with muscle involvement without evidence of necrosis: Secondary | ICD-10-CM | POA: Diagnosis not present

## 2023-10-26 DIAGNOSIS — M7731 Calcaneal spur, right foot: Secondary | ICD-10-CM | POA: Diagnosis not present

## 2023-10-26 DIAGNOSIS — L97412 Non-pressure chronic ulcer of right heel and midfoot with fat layer exposed: Secondary | ICD-10-CM | POA: Diagnosis not present

## 2023-11-03 ENCOUNTER — Ambulatory Visit: Payer: PPO

## 2023-11-03 ENCOUNTER — Telehealth (HOSPITAL_COMMUNITY): Payer: Self-pay

## 2023-11-03 VITALS — Ht 66.0 in | Wt 258.0 lb

## 2023-11-03 DIAGNOSIS — R52 Pain, unspecified: Secondary | ICD-10-CM | POA: Diagnosis not present

## 2023-11-03 DIAGNOSIS — Z794 Long term (current) use of insulin: Secondary | ICD-10-CM

## 2023-11-03 DIAGNOSIS — F331 Major depressive disorder, recurrent, moderate: Secondary | ICD-10-CM | POA: Diagnosis not present

## 2023-11-03 DIAGNOSIS — E11319 Type 2 diabetes mellitus with unspecified diabetic retinopathy without macular edema: Secondary | ICD-10-CM

## 2023-11-03 DIAGNOSIS — Z789 Other specified health status: Secondary | ICD-10-CM

## 2023-11-03 DIAGNOSIS — E113393 Type 2 diabetes mellitus with moderate nonproliferative diabetic retinopathy without macular edema, bilateral: Secondary | ICD-10-CM

## 2023-11-03 DIAGNOSIS — F411 Generalized anxiety disorder: Secondary | ICD-10-CM

## 2023-11-03 DIAGNOSIS — Z0000 Encounter for general adult medical examination without abnormal findings: Secondary | ICD-10-CM | POA: Diagnosis not present

## 2023-11-03 NOTE — Telephone Encounter (Signed)
 Called and spoke with patient- advised him okay to proceed with itamar home sleep study. Patient aware and verbalized understanding.   Advised patient to call back to office with any issues, questions, or concerns. Patient verbalized understanding.

## 2023-11-03 NOTE — Addendum Note (Signed)
 Addended by: Kerby Nora E on: 11/03/2023 01:15 PM   Modules accepted: Orders

## 2023-11-03 NOTE — Patient Instructions (Signed)
 [name] , Thank you for taking time to come for your Medicare Wellness Visit. I appreciate your ongoing commitment to your health goals. Please review the following plan we discussed and let me know if I can assist you in the future.   Referrals/Orders/Follow-Ups/Clinician Recommendations:    This is a list of the screening recommended for you and due dates:  Health Maintenance  Topic Date Due   Zoster (Shingles) Vaccine (1 of 2) Never done   Eye exam for diabetics  01/26/2022   COVID-19 Vaccine (4 - 2024-25 season) 04/30/2023   DTaP/Tdap/Td vaccine (2 - Tdap) 09/06/2024*   Colon Cancer Screening  09/06/2024*   Hemoglobin A1C  03/04/2024   Yearly kidney health urinalysis for diabetes  05/17/2024   Yearly kidney function blood test for diabetes  09/04/2024   Complete foot exam   09/06/2024   Medicare Annual Wellness Visit  11/02/2024   Pneumonia Vaccine  Completed   Flu Shot  Completed   Hepatitis C Screening  Completed   HPV Vaccine  Aged Out  *Topic was postponed. The date shown is not the original due date.    Advanced directives: (Declined) Advance directive discussed with you today. Even though you declined this today, please call our office should you change your mind, and we can give you the proper paperwork for you to fill out.  Next Medicare Annual Wellness Visit scheduled for next year: Yes 11/06/2023 @ 10:50am

## 2023-11-03 NOTE — Progress Notes (Signed)
 Subjective:   [name] is a 73 y.o. who presents for a Medicare Wellness preventive visit.  Visit Complete: Virtual I connected with  [name] on 11/03/23 by a audio enabled telemedicine application and verified that I am speaking with the correct person using two identifiers.  Patient Location: Home  Provider Location: Home Office  I discussed the limitations of evaluation and management by telemedicine. The patient expressed understanding and agreed to proceed.  Vital Signs: Because this visit was a virtual/telehealth visit, some criteria may be missing or patient reported. Any vitals not documented were not able to be obtained and vitals that have been documented are patient reported.  VideoDeclined- This patient declined Librarian, academic. Therefore the visit was completed with audio only.  AWV Questionnaire: No: Patient Medicare AWV questionnaire was not completed prior to this visit.  Cardiac Risk Factors include: advanced age (>86men, >76 women);diabetes mellitus;dyslipidemia;hypertension;male gender;obesity (BMI >30kg/m2);sedentary lifestyle     Objective:    Today's Vitals   11/03/23 0902  Weight: 258 lb (117 kg)  Height: 5\' 6"  (1.676 m)   Body mass index is 41.64 kg/m.     11/03/2023    9:24 AM 03/27/2023   10:15 AM 03/03/2023    7:02 AM 11/09/2022   11:03 AM 11/05/2022    8:35 AM 11/11/2021    1:59 PM 11/10/2020    2:06 PM  Advanced Directives  Does Patient Have a Medical Advance Directive? No No No No No No No  Would patient like information on creating a medical advance directive?  No - Patient declined Yes (MAU/Ambulatory/Procedural Areas - Information given) No - Patient declined No - Patient declined Yes (MAU/Ambulatory/Procedural Areas - Information given) No - Patient declined    Current Medications (verified) Outpatient Encounter Medications as of 11/03/2023  Medication Sig   Acetaminophen (TYLENOL PO) Take 3-4 tablets  by mouth as needed (pain).   apixaban (ELIQUIS) 5 MG TABS tablet Take 1 tablet (5 mg total) by mouth 2 (two) times daily.   carvedilol (COREG) 6.25 MG tablet Take 1 tablet (6.25 mg total) by mouth 2 (two) times daily with a meal.   Cholecalciferol (D-3-5) 125 MCG (5000 UT) capsule Take 5,000 Units by mouth daily.   clopidogrel (PLAVIX) 75 MG tablet TAKE ONE TABLET BY MOUTH ONCE DAILY WITH BREAKFAST   Continuous Blood Gluc Sensor (FREESTYLE LIBRE 2 SENSOR) MISC APPLY SENSOR EVERY 14 DAYS TO MONITOR SUGAR CONTINOUSLY   Continuous Glucose Receiver (FREESTYLE LIBRE 2 READER) DEVI USE WITH SENSORS TO MONITOR SUGAR CONTINUOUSLY   Dulaglutide (TRULICITY) 4.5 MG/0.5ML SOAJ INJECT 4.5MG  (0.5ML) UNDER THE SKIN ONCE A WEEK   ENTRESTO 24-26 MG TAKE ONE TABLET BY MOUTH TWO TIMES DAILY.   ezetimibe (ZETIA) 10 MG tablet Take 1 tablet (10 mg total) by mouth daily.   furosemide (LASIX) 40 MG tablet Take 40 mg by mouth daily.   gabapentin (NEURONTIN) 100 MG capsule TAKE ONE CAPSULE (100 MG TOTAL) BY MOUTH AT BEDTIME.   HYDROcodone-acetaminophen (NORCO/VICODIN) 5-325 MG tablet Take 1 tablet by mouth 3 (three) times daily as needed for moderate pain (pain score 4-6).   HYDROcodone-acetaminophen (NORCO/VICODIN) 5-325 MG tablet Take 1 tablet by mouth 3 (three) times daily as needed for moderate pain (pain score 4-6).   insulin aspart (NOVOLOG FLEXPEN) 100 UNIT/ML FlexPen INJECT 13 UNITS UNDER THE SKIN EVERY MORNING AND THREE UNITS AS NEEDED IN THE EVENING.   isosorbide mononitrate (IMDUR) 30 MG 24 hr tablet TAKE ONE TABLET  BY MOUTH TWICE DAILY   JARDIANCE 10 MG TABS tablet TAKE ONE TABLET (10 MG TOTAL) BY MOUTH DAILY.   nitroGLYCERIN (NITROSTAT) 0.4 MG SL tablet DISSOLVE 1 TABLET UNDER TONGUE AS NEEDEDFOR CHEST PAIN. MAY REPEAT 5 MINUTES APART 3 TIMES IF NEEDED   spironolactone (ALDACTONE) 25 MG tablet TAKE ONE TABLET (25 MG TOTAL) BY MOUTH DAILY.   TRESIBA FLEXTOUCH 100 UNIT/ML FlexTouch Pen INJECT 50 UNITS INTO THE  SKIN DAILY.   TRUEPLUS 5-BEVEL PEN NEEDLES 31G X 6 MM MISC USE TO INJECT INSULIN THREE TIMES A DAY   venlafaxine XR (EFFEXOR-XR) 150 MG 24 hr capsule TAKE ONE CAPSULE BY MOUTH DAILY WITH BREAKFAST. TAKE WITH EFFEXOR XR 75MG  FOR A TOTAL OF 225MG    venlafaxine XR (EFFEXOR-XR) 75 MG 24 hr capsule TAKE ONE CAPSULE BY MOUTH ONCE DAILY. TAKE IN ADDITION TO THE 150 MG CAPSULE FOR A TOTAL DOSE OF 225MG  DAILY   vitamin B-12 (CYANOCOBALAMIN) 1000 MCG tablet Take 1,000 mcg by mouth daily.   No facility-administered encounter medications on file as of 11/03/2023.    Allergies (verified) Bupropion, Ozempic (0.25 or 0.5 mg-dose) [semaglutide(0.25 or 0.5mg -dos)], and Atorvastatin   History: Past Medical History:  Diagnosis Date   Back injury 02/2002   worker's comp   CHF (congestive heart failure) (HCC)    Coronary artery disease, non-occlusive    a. cath 2002 with no sig CAD;  b. cath 2008 normal LM, LAD, LCx, p&dRCA 20-30%, PDA 30%; c.11/2015 NSTEMI/PCI: LM nl, LAD 95p (2.5x15 Xience DES), LCX nl, RCA 100p/m w/ L->R collats, EF 55-65% c. NSTEMI (02/2016) with no culprit leision, switched to Brilinta.  d. NSTEMI 03/2016: again, no culprit lesion and switched back to plavix 2/2 SOB with Brilnta.     Depression    Diabetes mellitus type 2, insulin dependent (HCC)    Hyperlipemia    Hypertension    Hypertensive heart disease    Kidney stones    Macular degeneration    Morbid obesity (HCC)    Osteoarthritis    Snoring    Past Surgical History:  Procedure Laterality Date   CARDIAC CATHETERIZATION  09/29/2000   diffuse LAD 30% LCA  EF 50-60%   CARDIAC CATHETERIZATION  06/30/2007   no significant CAD   CARDIAC CATHETERIZATION N/A 12/25/2015   Procedure: Left Heart Cath and Coronary Angiography;  Surgeon: Antonieta Iba, MD;  Location: [facility] INVASIVE CV LAB;  Service: Cardiovascular;  Laterality: N/A;   CARDIAC CATHETERIZATION N/A 12/25/2015   Procedure: Coronary Stent Intervention;  Surgeon: Alwyn Pea, MD;  Location: [facility] INVASIVE CV LAB;  Service: Cardiovascular;  Laterality: N/A;   CARDIAC CATHETERIZATION N/A 03/10/2016   Procedure: Left Heart Cath and Coronary Angiography;  Surgeon: Iran Ouch, MD;  Location: [facility] INVASIVE CV LAB;  Service: Cardiovascular;  Laterality: N/A;   CARDIAC CATHETERIZATION N/A 04/06/2016   Procedure: Left Heart Cath and Coronary Angiography;  Surgeon: Lyn Records, MD;  Location: [facility] INVASIVE CV LAB;  Service: Cardiovascular;  Laterality: N/A;   CARDIOVERSION N/A 03/03/2023   Procedure: CARDIOVERSION;  Surgeon: Antonieta Iba, MD;  Location: [facility] ORS;  Service: Cardiovascular;  Laterality: N/A;   CIRCUMCISION     CORONARY ANGIOPLASTY     LOWER EXTREMITY ANGIOGRAPHY Right 03/27/2023   Procedure: Lower Extremity Angiography;  Surgeon: Annice Needy, MD;  Location: [facility] INVASIVE CV LAB;  Service: Cardiovascular;  Laterality: Right;   Family History  Problem Relation Age of Onset   Alzheimer's disease Mother  Emphysema Mother    Diabetes Father    Heart disease Father        MI   Cancer Brother        ? Neck cancer   Social History   Socioeconomic History   Marital status: Widowed    Spouse name: Not on file   Number of children: 3   Years of education: Not on file   Highest education level: 7th grade  Occupational History   Occupation: disabled    Associate Professor: UNEMPLOYED    Comment: back injury  Tobacco Use   Smoking status: Never   Smokeless tobacco: Never  Vaping Use   Vaping status: Never Used  Substance and Sexual Activity   Alcohol use: No    Alcohol/week: 0.0 standard drinks of alcohol   Drug use: No   Sexual activity: Not Currently  Other Topics Concern   Not on file  Social History Narrative   Has a roommate, Mr. [name]. No pets.   Social Drivers of Corporate investment banker Strain: Low Risk  (11/03/2023)   Overall Financial Resource Strain (CARDIA)    Difficulty of Paying Living Expenses: Not hard at all   Food Insecurity: No Food Insecurity (11/03/2023)   Hunger Vital Sign    Worried About Running Out of Food in the Last Year: Never true    Ran Out of Food in the Last Year: Never true  Transportation Needs: No Transportation Needs (11/03/2023)   PRAPARE - Administrator, Civil Service (Medical): No    Lack of Transportation (Non-Medical): No  Physical Activity: Inactive (11/03/2023)   Exercise Vital Sign    Days of Exercise per Week: 0 days    Minutes of Exercise per Session: 0 min  Stress: Stress Concern Present (11/03/2023)   [employer] of Occupational Health - Occupational Stress Questionnaire    Feeling of Stress : Rather much  Social Connections: Socially Isolated (11/03/2023)   Social Connection and Isolation Panel [NHANES]    Frequency of Communication with Friends and Family: More than three times a week    Frequency of Social Gatherings with Friends and Family: Twice a week    Attends Religious Services: Never    Database administrator or Organizations: No    Attends Banker Meetings: Never    Marital Status: Widowed    Tobacco Counseling Counseling given: Not Answered    Clinical Intake:  Pre-visit preparation completed: Yes  Pain : No/denies pain     BMI - recorded: 41.64 Nutritional Status: BMI > 30  Obese Nutritional Risks: None Diabetes: Yes CBG done?: Yes (BS 109 this am at home) CBG resulted in Enter/ Edit results?: No Did pt. bring in CBG monitor from home?: No  How often do you need to have someone help you when you read instructions, pamphlets, or other written materials from your doctor or pharmacy?: 1 - Never  Interpreter Needed?: No  Comments: have roommate lives with pt Information entered by :: B.Marena Witts,LPN   Activities of Daily Living     11/03/2023    9:24 AM 03/27/2023   10:11 AM  In your present state of health, do you have any difficulty performing the following activities:  Hearing? 1 0  Vision? 1 1   Difficulty concentrating or making decisions? 1 0  Walking or climbing stairs? 1 0  Dressing or bathing? 0 0  Doing errands, shopping? 1   Preparing Food and eating ? Y   Using  the Toilet? N   In the past six months, have you accidently leaked urine? N   Do you have problems with loss of bowel control? N   Managing your Medications? N   Managing your Finances? Y   Comment daughter and g-daughter helps   Housekeeping or managing your Housekeeping? Y     Patient Care Team: [name], MD as PCP - General Mariah Milling Tollie Pizza, MD as PCP - Cardiology (Cardiology) Antonieta Iba, MD as Consulting Physician (Cardiology) Kathyrn Sheriff, [facility] (Inactive) as Pharmacist (Pharmacist)  Indicate any recent Medical Services you may have received from other than [facility] providers in the past year (date may be approximate).     Assessment:   This is a routine wellness examination for [name].  Hearing/Vision screen Hearing Screening - Comments:: Pt says not as good as use to be but does not need hearing aids or anything Vision Screening - Comments:: Pt says his vision is bad due to DM Gets eye injections at [facility] 14 weeks decrease pressure and swelling   Goals Addressed               This Visit's Progress     Patient Stated (pt-stated)        No goals       Depression Screen     11/03/2023    9:16 AM 06/08/2023    3:31 PM 02/22/2023   11:42 AM 11/09/2022   10:58 AM 10/24/2022    3:22 PM 08/18/2022    3:52 PM 11/16/2021    9:34 AM  PHQ 2/9 Scores  PHQ - 2 Score 0  0 4 2 5 5   PHQ- 9 Score   9 12 11 13 17   Exception Documentation  Patient refusal         Fall Risk     11/03/2023    9:10 AM 02/22/2023   11:42 AM 11/09/2022   10:52 AM 02/08/2022   10:36 AM 11/11/2021    2:18 PM  Fall Risk   Falls in the past year? 1 0 0 0 1  Number falls in past yr: 1 1 0 0 1  Injury with Fall? 0 0 0 0 0  Risk for fall due to : Impaired balance/gait;Impaired mobility;History of fall(s)  Impaired balance/gait No Fall Risks No Fall Risks Impaired balance/gait  Follow up Education provided;Falls prevention discussed Falls evaluation completed Falls prevention discussed;Falls evaluation completed Falls evaluation completed Follow up appointment    MEDICARE RISK AT HOME:  Medicare Risk at Home Any stairs in or around the home?: Yes If so, are there any without handrails?: No (has ramp) Home free of loose throw rugs in walkways, pet beds, electrical cords, etc?: Yes Adequate lighting in your home to reduce risk of falls?: Yes Life alert?: No Use of a cane, walker or w/c?: Yes Grab bars in the bathroom?: No Shower chair or bench in shower?: Yes Elevated toilet seat or a handicapped toilet?: Yes  TIMED UP AND GO:  Was the test performed?  No  Cognitive Function: 6CIT completed    11/10/2020    2:12 PM 02/27/2018    9:02 AM 02/21/2017    1:28 PM  MMSE - Mini Mental State Exam  Orientation to time 5 5 5   Orientation to Place 5 5 5   Registration 3 3 3   Attention/ Calculation 5 0 0  Recall 3 3 3   Language- name 2 objects  0 0  Language- repeat 1 1 1  Language- follow 3 step command  3 3  Language- read & follow direction  0 0  Write a sentence  0 0  Copy design  0 0  Total score  20 20        11/03/2023    9:26 AM 11/09/2022   11:05 AM 11/11/2021    2:04 PM  6CIT Screen  What Year? 0 points 0 points 0 points  What month? 0 points 0 points 0 points  What time? 0 points 0 points 0 points  Count back from 20 2 points 0 points 0 points  Months in reverse 4 points 4 points 0 points  Repeat phrase 6 points 10 points 4 points  Total Score 12 points 14 points 4 points    Immunizations Immunization History  Administered Date(s) Administered   Fluad Quad(high Dose 65+) 05/23/2020, 05/12/2022   Fluad Trivalent(High Dose 65+) 05/25/2023   Influenza Split 05/12/2011, 07/09/2012   Influenza Whole 06/30/2007, 05/29/2008, 07/03/2009   Influenza, High Dose Seasonal PF  05/14/2019   Influenza,inj,Quad PF,6+ Mos 05/31/2013, 07/09/2014, 06/25/2015, 07/05/2016, 06/22/2017, 06/11/2018   PFIZER(Purple Top)SARS-COV-2 Vaccination 10/28/2019, 11/18/2019, 05/29/2020   Pneumococcal Conjugate-13 02/14/2015   Pneumococcal Polysaccharide-23 06/30/2007, 12/21/2012, 02/27/2018   Td 06/30/2007    Screening Tests Health Maintenance  Topic Date Due   Zoster Vaccines- Shingrix (1 of 2) Never done   OPHTHALMOLOGY EXAM  01/26/2022   COVID-19 Vaccine (4 - 2024-25 season) 04/30/2023   DTaP/Tdap/Td (2 - Tdap) 09/06/2024 (Originally 06/29/2017)   Colonoscopy  09/06/2024 (Originally 09/04/2019)   HEMOGLOBIN A1C  03/04/2024   Diabetic kidney evaluation - Urine ACR  05/17/2024   Diabetic kidney evaluation - eGFR measurement  09/04/2024   FOOT EXAM  09/06/2024   Medicare Annual Wellness (AWV)  11/02/2024   Pneumonia Vaccine 68+ Years old  Completed   INFLUENZA VACCINE  Completed   Hepatitis C Screening  Completed   HPV VACCINES  Aged Out    Health Maintenance  Health Maintenance Due  Topic Date Due   Zoster Vaccines- Shingrix (1 of 2) Never done   OPHTHALMOLOGY EXAM  01/26/2022   COVID-19 Vaccine (4 - 2024-25 season) 04/30/2023   Health Maintenance Items Addressed:   Additional Screening:  Vision Screening: Recommended annual ophthalmology exams for early detection of glaucoma and other disorders of the eye.  Dental Screening: Recommended annual dental exams for proper oral hygiene  Community Resource Referral / Chronic Care Management: CRR required this visit?  No  CCM required this visit?  PCP informed of CCM need     Plan:     I have personally reviewed and noted the following in the patient's chart:   Medical and social history Use of alcohol, tobacco or illicit drugs  Current medications and supplements including opioid prescriptions. Patient is currently taking opioid prescriptions. Information provided to patient regarding non-opioid alternatives.  Patient advised to discuss non-opioid treatment plan with their provider. Functional ability and status Nutritional status Physical activity Advanced directives List of other physicians Hospitalizations, surgeries, and ER visits in previous 12 months Vitals Screenings to include cognitive, depression, and falls Referrals and appointments  In addition, I have reviewed and discussed with patient certain preventive protocols, quality metrics, and best practice recommendations. A written personalized care plan for preventive services as well as general preventive health recommendations were provided to patient.     Sue Lush, LPN   03/06/2955   After Visit Summary: (MyChart) Due to this being a telephonic visit, the after visit summary  with patients personalized plan was offered to patient via MyChart   Notes:  Pt relays he is doing "the best" he can as he says he do longer can stand for any lenghts of time. 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 He says he has no energy for years (since wife death probable). He also relays he is going to the wound clinic for two open places on on his rt foot. Pt has health issues that are in need of CCM.

## 2023-11-03 NOTE — Telephone Encounter (Signed)
-----   Message from CMA Philicia B sent at 10/24/2023  8:57 AM EST ----- Regarding: RE: ITAMAR No pre cert required ----- Message ----- From: Baird Cancer, RN Sent: 10/23/2023   4:29 PM EST To: Philicia R Branch, CMA Subject: Patsey Berthold can you check pre cert for itamar?    Thank youuu!

## 2023-11-06 ENCOUNTER — Other Ambulatory Visit: Payer: Self-pay | Admitting: Family Medicine

## 2023-11-06 ENCOUNTER — Ambulatory Visit: Payer: Self-pay

## 2023-11-06 ENCOUNTER — Other Ambulatory Visit: Payer: Self-pay | Admitting: Family

## 2023-11-06 ENCOUNTER — Telehealth: Payer: Self-pay | Admitting: *Deleted

## 2023-11-06 DIAGNOSIS — E11319 Type 2 diabetes mellitus with unspecified diabetic retinopathy without macular edema: Secondary | ICD-10-CM

## 2023-11-06 NOTE — Patient Instructions (Signed)
 Visit Information  Thank you for taking time to visit with me today. Please don't hesitate to contact me if I can be of assistance to you.   Following are the goals we discussed today:   Goals Addressed             This Visit's Progress    Management and education of health conditions.       Interventions Today    Flowsheet Row Most Recent Value  Chronic Disease   Chronic disease during today's visit Diabetes, Congestive Heart Failure (CHF), Other  [right foot ulcer/ wounds, falls]  General Interventions   General Interventions Discussed/Reviewed General Interventions Discussed, Labs, Doctor Visits  [evaluation for current treatment plan for listed health conditions and patients adherence to plan as established by provider. Assessed BS, HF symptoms,  ongoing wound management.]  Labs Hgb A1c every 6 months  [Discussed most recent Hgb A1c results.]  Doctor Visits Discussed/Reviewed Doctor Visits Discussed  [reviewed upcoming provider visits. Confirmed patient has transportation to appointments. Advised to keep follow up appointments. Confirmed patient is keeping appointments documented on calendar.]  Exercise Interventions   Exercise Discussed/Reviewed Physical Activity  [Assessed patient current activity level.  Assessed for in home services, ie home health.]  Education Interventions   Education Provided Provided Education, Provided Printed Education  [Confirmed patient has assistance with foot dressing changes. Advised to change dressing as recommended. Advised to elevate legs when sitting.]  Provided Verbal Education On Blood Sugar Monitoring, When to see the doctor, Other  [Discussed Rule of 15 hypoglycemic treatment. Reviewed HF symptoms/ action plan.Advised to continue to weigh daily. Report mild/ moderate HF symptoms to provider. Call 911 for severe symptoms. Reviewed signs of infection and call provider for symptoms.]  Mental Health Interventions   Mental Health Discussed/Reviewed  Depression  [active listening and support. Confirmed with patient he is scheduled with licensed clinical SW.]  Nutrition Interventions   Nutrition Discussed/Reviewed Nutrition Discussed  [Advised to eat bedtime snack prior to bed to avoid low blood sugar during hs. Advised patient to eat balanced meals during the day.]  Pharmacy Interventions   Pharmacy Dicussed/Reviewed Pharmacy Topics Discussed  [Medication reviewed and compliance discussed. instructed to check expiration date on nitroglycerin. Advised to consider using magnifying glass to read prescriptions.]  Safety Interventions   Safety Discussed/Reviewed Fall Risk  [Assessed for falls and fall injuries. Education article for fall prevention sent to patient. Assessed for ambulatory device and advised ongoing use.]              Our next appointment is by telephone on 11/20/23 at 2:45 pm  Please call the care guide team at [phone] if you need to cancel or reschedule your appointment.   If you are experiencing a Mental Health or Behavioral Health Crisis or need someone to talk to, please call the Suicide and Crisis Lifeline: 988 call 1-800-273-TALK (toll free, 24 hour hotline)  Patient verbalizes understanding of instructions and care plan provided today and agrees to view in MyChart. Active MyChart status and patient understanding of how to access instructions and care plan via MyChart confirmed with patient.     George Ina RN, BSN, CCM [place]  [facility], Population Health Case Manager Phone: [phone]     Fall Prevention in the Home, Adult Falls can cause injuries and can happen to people of all ages. There are many things you can do to make your home safer and to help prevent falls. What actions can I take to  prevent falls? General information Use good lighting in all rooms. Make sure to: Replace any light bulbs that burn out. Turn on the lights in dark areas and use night-lights. Keep  items that you use often in easy-to-reach places. Lower the shelves around your home if needed. Move furniture so that there are clear paths around it. Do not use throw rugs or other things on the floor that can make you trip. If any of your floors are uneven, fix them. Add color or contrast paint or tape to clearly mark and help you see: Grab bars or handrails. First and last steps of staircases. Where the edge of each step is. If you use a ladder or stepladder: Make sure that it is fully opened. Do not climb a closed ladder. Make sure the sides of the ladder are locked in place. Have someone hold the ladder while you use it. Know where your pets are as you move through your home. What can I do in the bathroom?     Keep the floor dry. Clean up any water on the floor right away. Remove soap buildup in the bathtub or shower. Buildup makes bathtubs and showers slippery. Use non-skid mats or decals on the floor of the bathtub or shower. Attach bath mats securely with double-sided, non-slip rug tape. If you need to sit down in the shower, use a non-slip stool. Install grab bars by the toilet and in the bathtub and shower. Do not use towel bars as grab bars. What can I do in the bedroom? Make sure that you have a light by your bed that is easy to reach. Do not use any sheets or blankets on your bed that hang to the floor. Have a firm chair or bench with side arms that you can use for support when you get dressed. What can I do in the kitchen? Clean up any spills right away. If you need to reach something above you, use a step stool with a grab bar. Keep electrical cords out of the way. Do not use floor polish or wax that makes floors slippery. What can I do with my stairs? Do not leave anything on the stairs. Make sure that you have a light switch at the top and the bottom of the stairs. Make sure that there are handrails on both sides of the stairs. Fix handrails that are broken or  loose. Install non-slip stair treads on all your stairs if they do not have carpet. Avoid having throw rugs at the top or bottom of the stairs. Choose a carpet that does not hide the edge of the steps on the stairs. Make sure that the carpet is firmly attached to the stairs. Fix carpet that is loose or worn. What can I do on the outside of my home? Use bright outdoor lighting. Fix the edges of walkways and driveways and fix any cracks. Clear paths of anything that can make you trip, such as tools or rocks. Add color or contrast paint or tape to clearly mark and help you see anything that might make you trip as you walk through a door, such as a raised step or threshold. Trim any bushes or trees on paths to your home. Check to see if handrails are loose or broken and that both sides of all steps have handrails. Install guardrails along the edges of any raised decks and porches. Have leaves, snow, or ice cleared regularly. Use sand, salt, or ice melter on paths if you live  where there is ice and snow during the winter. Clean up any spills in your garage right away. This includes grease or oil spills. What other actions can I take? Review your medicines with your doctor. Some medicines can cause dizziness or changes in blood pressure, which increase your risk of falling. Wear shoes that: Have a low heel. Do not wear high heels. Have rubber bottoms and are closed at the toe. Feel good on your feet and fit well. Use tools that help you move around if needed. These include: Canes. Walkers. Scooters. Crutches. Ask your doctor what else you can do to help prevent falls. This may include seeing a physical therapist to learn to do exercises to move better and get stronger. Where to find more information Centers for Disease Control and Prevention, STEADI: TonerPromos.no [employer] on Aging: BaseRingTones.pl National Institute on Aging: BaseRingTones.pl Contact a doctor if: You are afraid of falling at  home. You feel weak, drowsy, or dizzy at home. You fall at home. Get help right away if you: Lose consciousness or have trouble moving after a fall. Have a fall that causes a head injury. These symptoms may be an emergency. Get help right away. Call 911. Do not wait to see if the symptoms will go away. Do not drive yourself to the hospital. This information is not intended to replace advice given to you by your health care provider. Make sure you discuss any questions you have with your health care provider. Document Revised: 04/18/2022 Document Reviewed: 04/18/2022 Elsevier Patient Education  2024 [employer].

## 2023-11-06 NOTE — Progress Notes (Signed)
 Complex Care Management Note  Care Guide Note 11/06/2023 Name: TAYTUM WHELLER MRN: [id number] DOB: [birth year]  [name] is a 73 y.o. year old male who sees Ermalene Searing, Amy E, MD for primary care. I reached out to [name] by phone today to offer complex care management services.  Mr. Macomber was given information about Complex Care Management services today including:   The Complex Care Management services include support from the care team which includes your Nurse Care Manager, Clinical Social Worker, or Pharmacist.  The Complex Care Management team is here to help remove barriers to the health concerns and goals most important to you. Complex Care Management services are voluntary, and the patient may decline or stop services at any time by request to their care team member.   Complex Care Management Consent Status: Patient agreed to services and verbal consent obtained.   Follow up plan:  Telephone appointment with complex care management team member scheduled for:  3/10/with [facility] and 3/18 with LCSW   Encounter Outcome:  Patient Scheduled  Gwenevere Ghazi  [facility]  [facility], [facility] Guide  Direct Dial: [phone]  Fax [phone]

## 2023-11-06 NOTE — Patient Outreach (Signed)
 Care Coordination   Initial Visit Note   11/06/2023 Name: [name] MRN: [id number] DOB: 11-Sep-1950  [name] is a 73 y.o. year old male who sees [name], MD for primary care. I spoke with  [name] by phone today.  What matters to the patients health and wellness today?  Patient referred to case manager by primary provider. Per chart review patients most recent Hgb A1c 9.3.  Patient reports today's fasting blood sugar is 103.  He reports hypoglycemic symptoms with 103 blood sugar. Reports normal blood sugar ranges from 150-275.  Patient reports occasional night time blood sugar in 50's. Patient states he uses [employer] and monitors blood sugars 4-5 times per day.  Patient states his eyesight is poor.  He reports seeing ophthalmologist regularly for Q14 week injections. Patient states he is still able to manage his medication. He states he marks his pill bottles for appropriate administration. Patient states he stopped driving last year at the recommendation of his provider.  Patient states he doesn't have much an appetite. He states his first meal may be at 3 pm. States he doesn't sleep much usually staying up until 5 am and sleeping until 10 am.  Patient denies having any heart failure symptoms. He states he monitors his weight daily unless he forgets. Patient  reports having 2-3 falls over the past year. Denies serious injury. Patient states his feet are numb which causes issues with his mobility/ balance.  He reports using a cane for ambulation. Patient states he stays fatigued and doesn't feel like doing anything.  He states he is still independent with his care however his daughter and lady friend assists as needed.      Goals Addressed             This Visit's Progress    Management and education of health conditions.       Interventions Today    Flowsheet Row Most Recent Value  Chronic Disease   Chronic disease during today's visit Diabetes, Congestive Heart  Failure (CHF), Other  [right foot ulcer/ wounds, falls]  General Interventions   General Interventions Discussed/Reviewed General Interventions Discussed, Labs, Doctor Visits  [evaluation for current treatment plan for listed health conditions and patients adherence to plan as established by provider. Assessed BS, HF symptoms,  ongoing wound management.]  Labs Hgb A1c every 6 months  [Discussed most recent Hgb A1c results.]  Doctor Visits Discussed/Reviewed Doctor Visits Discussed  [reviewed upcoming provider visits. Confirmed patient has transportation to appointments. Advised to keep follow up appointments. Confirmed patient is keeping appointments documented on calendar.]  Exercise Interventions   Exercise Discussed/Reviewed Physical Activity  [Assessed patient current activity level.  Assessed for in home services, ie home health.]  Education Interventions   Education Provided Provided Education, Provided Printed Education  [Confirmed patient has assistance with foot dressing changes. Advised to change dressing as recommended. Advised to elevate legs when sitting.]  Provided Verbal Education On Blood Sugar Monitoring, When to see the doctor, Other  [Discussed Rule of 15 hypoglycemic treatment. Reviewed HF symptoms/ action plan.Advised to continue to weigh daily. Report mild/ moderate HF symptoms to provider. Call 911 for severe symptoms. Reviewed signs of infection and call provider for symptoms.]  Mental Health Interventions   Mental Health Discussed/Reviewed Depression  [active listening and support. Confirmed with patient he is scheduled with licensed clinical SW.]  Nutrition Interventions   Nutrition Discussed/Reviewed Nutrition Discussed  [Advised to eat bedtime snack prior  to bed to avoid low blood sugar during hs. Advised patient to eat balanced meals during the day.]  Pharmacy Interventions   Pharmacy Dicussed/Reviewed Pharmacy Topics Discussed  [Medication reviewed and compliance  discussed. instructed to check expiration date on nitroglycerin. Advised to consider using magnifying glass to read prescriptions.]  Safety Interventions   Safety Discussed/Reviewed Fall Risk  [Assessed for falls and fall injuries. Education article for fall prevention sent to patient. Assessed for ambulatory device and advised ongoing use.]              SDOH assessments and interventions completed:  Yes  SDOH Interventions Today    Flowsheet Row Most Recent Value  SDOH Interventions   Food Insecurity Interventions Intervention Not Indicated  Housing Interventions Intervention Not Indicated  Transportation Interventions Intervention Not Indicated  Utilities Interventions Intervention Not Indicated        Care Coordination Interventions:  Yes, provided   Follow up plan: Follow up call scheduled for 11/20/23 at 2:45 pm.    Encounter Outcome:  Patient Visit Completed   George Ina RN, BSN, CCM [place]  [facility], Population Health Case Manager Phone: [phone]

## 2023-11-09 ENCOUNTER — Encounter: Payer: PPO | Attending: Physician Assistant | Admitting: Physician Assistant

## 2023-11-09 DIAGNOSIS — S91101A Unspecified open wound of right great toe without damage to nail, initial encounter: Secondary | ICD-10-CM | POA: Diagnosis not present

## 2023-11-09 DIAGNOSIS — L97512 Non-pressure chronic ulcer of other part of right foot with fat layer exposed: Secondary | ICD-10-CM | POA: Diagnosis not present

## 2023-11-09 DIAGNOSIS — L97412 Non-pressure chronic ulcer of right heel and midfoot with fat layer exposed: Secondary | ICD-10-CM | POA: Insufficient documentation

## 2023-11-09 DIAGNOSIS — Z7901 Long term (current) use of anticoagulants: Secondary | ICD-10-CM | POA: Diagnosis not present

## 2023-11-09 DIAGNOSIS — L97513 Non-pressure chronic ulcer of other part of right foot with necrosis of muscle: Secondary | ICD-10-CM | POA: Insufficient documentation

## 2023-11-09 DIAGNOSIS — E11621 Type 2 diabetes mellitus with foot ulcer: Secondary | ICD-10-CM | POA: Diagnosis not present

## 2023-11-09 DIAGNOSIS — I2510 Atherosclerotic heart disease of native coronary artery without angina pectoris: Secondary | ICD-10-CM | POA: Insufficient documentation

## 2023-11-09 DIAGNOSIS — Z7902 Long term (current) use of antithrombotics/antiplatelets: Secondary | ICD-10-CM | POA: Insufficient documentation

## 2023-11-09 DIAGNOSIS — I480 Paroxysmal atrial fibrillation: Secondary | ICD-10-CM | POA: Diagnosis not present

## 2023-11-09 DIAGNOSIS — I10 Essential (primary) hypertension: Secondary | ICD-10-CM | POA: Insufficient documentation

## 2023-11-10 DIAGNOSIS — E11621 Type 2 diabetes mellitus with foot ulcer: Secondary | ICD-10-CM | POA: Diagnosis not present

## 2023-11-14 ENCOUNTER — Encounter: Payer: Self-pay | Admitting: Licensed Clinical Social Worker

## 2023-11-14 ENCOUNTER — Telehealth: Payer: Self-pay | Admitting: Licensed Clinical Social Worker

## 2023-11-14 ENCOUNTER — Ambulatory Visit: Payer: Self-pay | Admitting: Family Medicine

## 2023-11-14 NOTE — Patient Outreach (Signed)
 Care Coordination   11/14/2023 Name: [name] MRN: [id number] DOB: 1951/03/16   Care Coordination Outreach Attempts:  An unsuccessful outreach was attempted for an appointment today.  Follow Up Plan:  Additional outreach attempts will be made to offer the patient complex care management information and services.   Encounter Outcome:  No Answer   Care Coordination Interventions:  No, not indicated    Jenel Lucks, LCSW [place]  [facility], [facility] Clinical Social Worker Direct Dial: [phone]  Fax: [phone] Website: [email or web address] 4:58 PM

## 2023-11-14 NOTE — Telephone Encounter (Signed)
  Chief Complaint: high blood sugar readings Symptoms: asymptomatic Frequency: patient states readings have been high for last month Pertinent Negatives: Patient denies fever, CP, SOB Disposition: [] ED /[] Urgent Care (no appt availability in office) / [] Appointment(In office/virtual)/ []  [place] [facility]/ [] Home Care/ [] Refused Recommended Disposition /[] [place] [facility]/ [x]  Follow-up with PCP Additional Notes: patient called with concerns for high blood sugar readings. Prior to calling, patient checked with his sensor and monitor ready HI. After speaking with patient throughout triage, This RN asked patient to check blood sugar again. BS of 367. Patient denies extreme thirst and excessive urination.  Patient states he hasn't changed how he has been eating nor has he missed his insulin medication. Patient is concerned that his medication need to be increased. Patient is asking for a phone call from staff.    Copied from CRM [id number]. Topic: Clinical - Red Word Triage >> Nov 14, 2023  4:36 PM Eunice Blase wrote: Red Word that prompted transfer to Nurse Triage: Pt called stated his blood sugar is very high can't get it down. Feels very sleepy Reason for Disposition  [1] Caller has NON-URGENT medication or insulin pump question AND [2] triager unable to answer question  Answer Assessment - Initial Assessment Questions 1. BLOOD GLUCOSE: "What is your blood glucose level?"      HI reading initially and then 367 2. ONSET: "When did you check the blood glucose?"     Just now-patient states he changed his sensor  3. USUAL RANGE: "What is your glucose level usually?" (e.g., usual fasting morning value, usual evening value)     80-150 4. KETONES: "Do you check for ketones (urine or blood test strips)?" If Yes, ask: "What does the test show now?"      no 5. TYPE 1 or 2:  "Do you know what type of diabetes you have?"  (e.g., Type 1, Type 2, Gestational; doesn't know)      Type 2 6. INSULIN:  "Do you take insulin?" "What type of insulin(s) do you use? What is the mode of delivery? (syringe, pen; injection or pump)?"      Novolog 13 units and Tresbia 50 units 7. DIABETES PILLS: "Do you take any pills for your diabetes?" If Yes, ask: "Have you missed taking any pills recently?"     Jardiance 8. OTHER SYMPTOMS: "Do you have any symptoms?" (e.g., fever, frequent urination, difficulty breathing, dizziness, weakness, vomiting)     No symptoms  Protocols used: Diabetes - High Blood Sugar-A-AH

## 2023-11-15 NOTE — Addendum Note (Signed)
 Addended by: Damita Lack on: 11/15/2023 12:03 PM   Modules accepted: Orders

## 2023-11-15 NOTE — Telephone Encounter (Signed)
 [name] notified as instructed by telephone.  He denies any signs of infection. He states he is taking his medications as prescribed.  He will increase the [place] to 55 units daily.  He states he feels good.  Follow up appointment scheduled 11/16/2023 at 3:40 pm with Dr. Ermalene Searing.  Medication list updated.  ER precautions given.

## 2023-11-15 NOTE — Telephone Encounter (Signed)
 LM for pt to returncall

## 2023-11-15 NOTE — Telephone Encounter (Signed)
 Call  Have him increase his water intake. Low carb diet.  Any sign of infeciton.. fever, cough, dysuria, diarrhea etc?  Verify he is taking meds as on his list.  If so have him increase Tresiba tp 55 Units daily.  Have him make a follow up appt for re-eval of blood sugars  Lab Results  Component Value Date   HGBA1C 9.3 (H) 09/05/2023    If not feeling better and sugars remaining high.. go to ER.

## 2023-11-16 ENCOUNTER — Telehealth: Payer: Self-pay

## 2023-11-16 ENCOUNTER — Emergency Department

## 2023-11-16 ENCOUNTER — Other Ambulatory Visit (INDEPENDENT_AMBULATORY_CARE_PROVIDER_SITE_OTHER): Payer: Self-pay | Admitting: Nurse Practitioner

## 2023-11-16 ENCOUNTER — Encounter: Payer: Self-pay | Admitting: Intensive Care

## 2023-11-16 ENCOUNTER — Emergency Department
Admission: EM | Admit: 2023-11-16 | Discharge: 2023-11-16 | Disposition: A | Discharge status: Home / Self Care | Attending: Family Medicine | Admitting: Family Medicine

## 2023-11-16 ENCOUNTER — Other Ambulatory Visit: Payer: Self-pay

## 2023-11-16 ENCOUNTER — Ambulatory Visit: Admitting: Family Medicine

## 2023-11-16 DIAGNOSIS — R531 Weakness: Secondary | ICD-10-CM | POA: Insufficient documentation

## 2023-11-16 DIAGNOSIS — Z5321 Procedure and treatment not carried out due to patient leaving prior to being seen by health care provider: Secondary | ICD-10-CM | POA: Insufficient documentation

## 2023-11-16 DIAGNOSIS — Z9889 Other specified postprocedural states: Secondary | ICD-10-CM

## 2023-11-16 DIAGNOSIS — R079 Chest pain, unspecified: Secondary | ICD-10-CM | POA: Diagnosis not present

## 2023-11-16 DIAGNOSIS — I10 Essential (primary) hypertension: Secondary | ICD-10-CM | POA: Diagnosis not present

## 2023-11-16 LAB — CBC
HCT: 41.7 % (ref 39.0–52.0)
Hemoglobin: 13.5 g/dL (ref 13.0–17.0)
MCH: 30 pg (ref 26.0–34.0)
MCHC: 32.4 g/dL (ref 30.0–36.0)
MCV: 92.7 fL (ref 80.0–100.0)
Platelets: 235 10*3/uL (ref 150–400)
RBC: 4.5 MIL/uL (ref 4.22–5.81)
RDW: 14.1 % (ref 11.5–15.5)
WBC: 12.8 10*3/uL — ABNORMAL HIGH (ref 4.0–10.5)
nRBC: 0 % (ref 0.0–0.2)

## 2023-11-16 LAB — PROTIME-INR
INR: 1.3 — ABNORMAL HIGH (ref 0.8–1.2)
Prothrombin Time: 16 s — ABNORMAL HIGH (ref 11.4–15.2)

## 2023-11-16 LAB — TROPONIN I (HIGH SENSITIVITY): Troponin I (High Sensitivity): 9 ng/L (ref <18)

## 2023-11-16 LAB — BASIC METABOLIC PANEL
Anion gap: 6 (ref 5–15)
BUN: 25 mg/dL — ABNORMAL HIGH (ref 8–23)
CO2: 26 mmol/L (ref 22–32)
Calcium: 8.4 mg/dL — ABNORMAL LOW (ref 8.9–10.3)
Chloride: 103 mmol/L (ref 98–111)
Creatinine, Ser: 1.13 mg/dL (ref 0.61–1.24)
GFR, Estimated: >60 mL/min (ref >60)
Glucose, Bld: 224 mg/dL — ABNORMAL HIGH (ref 70–99)
Potassium: 4.1 mmol/L (ref 3.5–5.1)
Sodium: 135 mmol/L (ref 135–145)

## 2023-11-16 NOTE — Telephone Encounter (Signed)
 Copied from CRM [id number]. Topic: Appointments - Appointment Cancel/Reschedule >> Nov 16, 2023 12:03 PM Desma Mcgregor wrote: Patient/patient representative is calling to cancel or reschedule an appointment. Refer to attachments for appointment information.

## 2023-11-16 NOTE — ED Provider Triage Note (Signed)
 Emergency Medicine Provider Triage Evaluation Note  [name] , a 73 y.o. male  was evaluated in triage.  Pt complains of chest pain and generalized weakness. Arrives via EMS who gave 1 nitroglycerine and ASA with some relief of pain. 5 previous MI. This feels different.  Physical Exam  BP 126/65 (BP Location: Right Arm)   Pulse 81   Temp 98.1 F (36.7 C) (Oral)   Resp 18   Ht 5\' 6"  (1.676 m)   Wt 118.8 kg   SpO2 97%   BMI 42.29 kg/m  Gen:   Awake, no distress   Resp:  Normal effort  MSK:   Moves extremities without difficulty  Other:    Medical Decision Making  Medically screening exam initiated at 1:41 PM.  Appropriate orders placed.  [name] was informed that the remainder of the evaluation will be completed by another provider, this initial triage assessment does not replace that evaluation, and the importance of remaining in the ED until their evaluation is complete.  Cardiac workup started.   Chinita Pester, FNP 11/16/23 1342

## 2023-11-16 NOTE — ED Triage Notes (Signed)
 First Nurse Note;  Pt via [facility] from home. Pt c/o chest tightness that woke him up out of his sleep. Denies any other symptoms. Pt has a hx of multiple Mis. Pt takes Plavix and Eliquis. Pt is A&Ox4 and NAD  EMS agve pt 1 Nitroglycerin and he took 324 ASA  128/84 BP  76 HR  20 RR  98% on RA 170 CBG  18 G R AC

## 2023-11-17 ENCOUNTER — Telehealth: Payer: Self-pay | Admitting: *Deleted

## 2023-11-17 ENCOUNTER — Encounter (INDEPENDENT_AMBULATORY_CARE_PROVIDER_SITE_OTHER)

## 2023-11-17 NOTE — Telephone Encounter (Signed)
 Copied from CRM [id number]. Topic: General - Other >> Nov 16, 2023  5:30 PM Eunice Blase wrote: Reason for CRM: Pt was just released from hospital [facility] asked that pt change appt time to 1:00 p.m. pt will there at 1:00.p.m.

## 2023-11-17 NOTE — Telephone Encounter (Signed)
 This phone note should have gone to [facility].  Not sure how to route to the appropriate person.  Please help.

## 2023-11-20 ENCOUNTER — Ambulatory Visit: Payer: Self-pay

## 2023-11-20 NOTE — Patient Instructions (Signed)
 Visit Information  Thank you for taking time to visit with me today. Please don't hesitate to contact me if I can be of assistance to you.   Following are the goals we discussed today:   Goals Addressed             This Visit's Progress    Management and education of health conditions.       Interventions Today    Flowsheet Row Most Recent Value  Chronic Disease   Chronic disease during today's visit Diabetes, Congestive Heart Failure (CHF), Other  [right foot ulcer wound/ falls.]  General Interventions   General Interventions Discussed/Reviewed General Interventions Reviewed, Doctor Visits  [evaluation of current treatment plan for listed health conditions and patients adherence to plan as established by provider.]  Doctor Visits Discussed/Reviewed Doctor Visits Reviewed  Annabell Sabal upcoming provider visits. Confirmed patient has transportation to appointments. Message sent to primary care provider of patients frequent elevated blood sugars]  Education Interventions   Education Provided Provided Education  [reviewed and assessed for signs of infection. Advised to continue dressing change to right foot as recommended by wound center. Advised to notify provider and wound center if infection symptoms are noted.]  Provided Verbal Education On Blood Sugar Monitoring  [assessed blood sugar readings. Discussed hyperglycemic management. Advised to notify provider of frequent blood sugars >250. confirmed patient changing free style libre sensor as recommended. Advised to check BS with glucometer to compare with Free style]  Nutrition Interventions   Nutrition Discussed/Reviewed Nutrition Reviewed, Carbohydrate meal planning  [discussed eating schedule. advised to eat at least 2 well balanced meals per day. Sample meals discussed. Diabetic nutrition education article mailed to patient. Discussed that peanut butter sandwiches higher in carbohyrdates]  Pharmacy Interventions   Pharmacy  Dicussed/Reviewed Pharmacy Topics Reviewed  [Confirmed patient taking diabetic medications as prescribed. Confirmed patient able to afford medications.]  Safety Interventions   Safety Discussed/Reviewed Fall Risk  [Assessed for falls. Fall prevention discussed.]                Our next appointment is by telephone on 11/11/23 at 2:45 pm  Please call the care guide team at [phone] if you need to cancel or reschedule your appointment.   If you are experiencing a Mental Health or Behavioral Health Crisis or need someone to talk to, please call the Suicide and Crisis Lifeline: 988 call 1-800-273-TALK (toll free, 24 hour hotline)  Patient verbalizes understanding of instructions and care plan provided today and agrees to view in MyChart. Active MyChart status and patient understanding of how to access instructions and care plan via MyChart confirmed with patient.     George Ina RN, BSN, CCM [employer], Population Health Case Manager Phone: [phone]  Diabetes Mellitus and Nutrition, Adult When you have diabetes, or diabetes mellitus, it is very important to have healthy eating habits because your blood sugar (glucose) levels are greatly affected by what you eat and drink. Eating healthy foods in the right amounts, at about the same times every day, can help you: Manage your blood glucose. Lower your risk of heart disease. Improve your blood pressure. Reach or maintain a healthy weight. What can affect my meal plan? Every person with diabetes is different, and each person has different needs for a meal plan. Your health care provider may recommend that you work with a dietitian to make a meal plan that is best for you. Your meal plan may vary depending on factors such as:  The calories you need. The medicines you take. Your weight. Your blood glucose, blood pressure, and cholesterol levels. Your activity level. Other health conditions you have,  such as heart or kidney disease. How do carbohydrates affect me? Carbohydrates, also called carbs, affect your blood glucose level more than any other type of food. Eating carbs raises the amount of glucose in your blood. It is important to know how many carbs you can safely have in each meal. This is different for every person. Your dietitian can help you calculate how many carbs you should have at each meal and for each snack. How does alcohol affect me? Alcohol can cause a decrease in blood glucose (hypoglycemia), especially if you use insulin or take certain diabetes medicines by mouth. Hypoglycemia can be a life-threatening condition. Symptoms of hypoglycemia, such as sleepiness, dizziness, and confusion, are similar to symptoms of having too much alcohol. Do not drink alcohol if: Your health care provider tells you not to drink. You are pregnant, may be pregnant, or are planning to become pregnant. If you drink alcohol: Limit how much you have to: 0-1 drink a day for women. 0-2 drinks a day for men. Know how much alcohol is in your drink. In the [place] one drink equals one 12 oz bottle of beer (355 mL), one 5 oz glass of wine (148 mL), or one 1 oz glass of hard liquor (44 mL). Keep yourself hydrated with water, diet soda, or unsweetened iced tea. Keep in mind that regular soda, juice, and other mixers may contain a lot of sugar and must be counted as carbs. What are tips for following this plan?  Reading food labels Start by checking the serving size on the Nutrition Facts label of packaged foods and drinks. The number of calories and the amount of carbs, fats, and other nutrients listed on the label are based on one serving of the item. Many items contain more than one serving per package. Check the total grams (g) of carbs in one serving. Check the number of grams of saturated fats and trans fats in one serving. Choose foods that have a low amount or none of these fats. Check the number  of milligrams (mg) of salt (sodium) in one serving. Most people should limit total sodium intake to less than 2,300 mg per day. Always check the nutrition information of foods labeled as "low-fat" or "nonfat." These foods may be higher in added sugar or refined carbs and should be avoided. Talk to your dietitian to identify your daily goals for nutrients listed on the label. Shopping Avoid buying canned, pre-made, or processed foods. These foods tend to be high in fat, sodium, and added sugar. Shop around the outside edge of the grocery store. This is where you will most often find fresh fruits and vegetables, bulk grains, fresh meats, and fresh dairy products. Cooking Use low-heat cooking methods, such as baking, instead of high-heat cooking methods, such as deep frying. Cook using healthy oils, such as olive, canola, or sunflower oil. Avoid cooking with butter, cream, or high-fat meats. Meal planning Eat meals and snacks regularly, preferably at the same times every day. Avoid going long periods of time without eating. Eat foods that are high in fiber, such as fresh fruits, vegetables, beans, and whole grains. Eat 4-6 oz (112-168 g) of lean protein each day, such as lean meat, chicken, fish, eggs, or tofu. One ounce (oz) (28 g) of lean protein is equal to: 1 oz (28 g) of meat,  chicken, or fish. 1 egg.  cup (62 g) of tofu. Eat some foods each day that contain healthy fats, such as avocado, nuts, seeds, and fish. What foods should I eat? Fruits Berries. Apples. Oranges. Peaches. Apricots. Plums. Grapes. Mangoes. Papayas. Pomegranates. Kiwi. Cherries. Vegetables Leafy greens, including lettuce, spinach, kale, chard, collard greens, mustard greens, and cabbage. Beets. Cauliflower. Broccoli. Carrots. Randon Somera beans. Tomatoes. Peppers. Onions. Cucumbers. Brussels sprouts. Grains Whole grains, such as whole-wheat or whole-grain bread, crackers, tortillas, cereal, and pasta. Unsweetened oatmeal.  Quinoa. Brown or wild rice. Meats and other proteins Seafood. Poultry without skin. Lean cuts of poultry and beef. Tofu. Nuts. Seeds. Dairy Low-fat or fat-free dairy products such as milk, yogurt, and cheese. The items listed above may not be a complete list of foods and beverages you can eat and drink. Contact a dietitian for more information. What foods should I avoid? Fruits Fruits canned with syrup. Vegetables Canned vegetables. Frozen vegetables with butter or cream sauce. Grains Refined white flour and flour products such as bread, pasta, snack foods, and cereals. Avoid all processed foods. Meats and other proteins Fatty cuts of meat. Poultry with skin. Breaded or fried meats. Processed meat. Avoid saturated fats. Dairy Full-fat yogurt, cheese, or milk. Beverages Sweetened drinks, such as soda or iced tea. The items listed above may not be a complete list of foods and beverages you should avoid. Contact a dietitian for more information. Questions to ask a health care provider Do I need to meet with a certified diabetes care and education specialist? Do I need to meet with a dietitian? What number can I call if I have questions? When are the best times to check my blood glucose? Where to find more information: American Diabetes Association: diabetes.org Academy of Nutrition and Dietetics: eatright.[employer] of Diabetes and Digestive and Kidney Diseases: StageSync.si Association of Diabetes Care & Education Specialists: diabeteseducator.org Summary It is important to have healthy eating habits because your blood sugar (glucose) levels are greatly affected by what you eat and drink. It is important to use alcohol carefully. A healthy meal plan will help you manage your blood glucose and lower your risk of heart disease. Your health care provider may recommend that you work with a dietitian to make a meal plan that is best for you. This information is not intended to  replace advice given to you by your health care provider. Make sure you discuss any questions you have with your health care provider. Document Revised: 03/17/2020 Document Reviewed: 03/18/2020 Elsevier Patient Education  2024 [employer].

## 2023-11-20 NOTE — Patient Outreach (Signed)
 Care Coordination   Follow Up Visit Note   11/20/2023 Name: [name] MRN: [id number] DOB: 1951/02/19  [name] is a 73 y.o. year old male who sees [name], MD for primary care. I spoke with  [name] by phone today.  What matters to the patients health and wellness today?  Patient reports going to ED on 11/16/23 due to chest pain. He states blood work and EKG were done.  Patient states he was advised he wasn't having a heart attack. Patient states he was not able to stay at the ED due to long wait time.  He reports leaving prior to completed visit. He states he had chinese food that day and could have possibly had indigestion.  Patient denies having any further chest pain.  Patient states he is scheduled to see his primary care provider on 12/07/23. Patient reports his blood sugars have been running higher than normal. He states his blood sugars are ranging from 150 to Hi.  He reports waking up in the middle of the night with a blood sugar reading of 200.  He reports having 1 low blood sugar of 68 within the last 2-3 weeks. Patient states he changes his sensor every 14 days as recommended for the free style libre. He reports taking all medications including diabetic medications as prescribed. Patient states he purchased a new free style libre to make sure it was working correctly. He reports having a reading on his free style libre of 197 and comparing it with the reading on the glucometer of 197.  Patient states he doesn't eat 3 meals a day because he wakes up late. He states he may eat a small meal and then dinner/ supper eating before 6 pm. He states he may eat a peanut butter sandwich for a bedtime snack.  He reports eating peanut butter sandwiches frequently.  Patient states the wounds on his foot are getting better.  He states he has a toe and heal wound left on his right foot. He states he is scheduled for a wound care center appointment on tomorrow 11/21/23. Patient states he is  changing the dressing every two days as recommended and applying the prescribed medication. Patient denies drainage of wound or signs of infection. He states he was advised by the wound care doctor to follow up with his vascular doctor which is scheduled for 12/01/23.    Goals Addressed             This Visit's Progress    Management and education of health conditions.       Interventions Today    Flowsheet Row Most Recent Value  Chronic Disease   Chronic disease during today's visit Diabetes, Congestive Heart Failure (CHF), Other  [right foot ulcer wound/ falls.]  General Interventions   General Interventions Discussed/Reviewed General Interventions Reviewed, Doctor Visits  [evaluation of current treatment plan for listed health conditions and patients adherence to plan as established by provider.]  Doctor Visits Discussed/Reviewed Doctor Visits Reviewed  Annabell Sabal upcoming provider visits. Confirmed patient has transportation to appointments. Message sent to primary care provider of patients frequent elevated blood sugars]  Education Interventions   Education Provided Provided Education  [reviewed and assessed for signs of infection. Advised to continue dressing change to right foot as recommended by wound center. Advised to notify provider and wound center if infection symptoms are noted.]  Provided Verbal Education On Blood Sugar Monitoring  [assessed blood sugar readings. Discussed hyperglycemic management. Advised  to notify provider of frequent blood sugars >250. confirmed patient changing free style libre sensor as recommended. Advised to check BS with glucometer to compare with Free style]  Nutrition Interventions   Nutrition Discussed/Reviewed Nutrition Reviewed, Carbohydrate meal planning  [discussed eating schedule. advised to eat at least 2 well balanced meals per day. Sample meals discussed. Diabetic nutrition education article mailed to patient. Discussed that peanut butter  sandwiches higher in carbohyrdates]  Pharmacy Interventions   Pharmacy Dicussed/Reviewed Pharmacy Topics Reviewed  [Confirmed patient taking diabetic medications as prescribed. Confirmed patient able to afford medications.]  Safety Interventions   Safety Discussed/Reviewed Fall Risk  [Assessed for falls. Fall prevention discussed.]                SDOH assessments and interventions completed:  No     Care Coordination Interventions:  Yes, provided   Follow up plan: Follow up call scheduled for 12/12/23 at 2:45 pm    Encounter Outcome:  Patient Visit Completed   George Ina RN, BSN, CCM [place]  [facility], Population Health Case Manager Phone: [phone]

## 2023-11-21 ENCOUNTER — Other Ambulatory Visit: Payer: Self-pay | Admitting: Family

## 2023-11-21 ENCOUNTER — Encounter: Admitting: Physician Assistant

## 2023-11-21 DIAGNOSIS — L97515 Non-pressure chronic ulcer of other part of right foot with muscle involvement without evidence of necrosis: Secondary | ICD-10-CM | POA: Diagnosis not present

## 2023-11-21 DIAGNOSIS — L97412 Non-pressure chronic ulcer of right heel and midfoot with fat layer exposed: Secondary | ICD-10-CM | POA: Diagnosis not present

## 2023-11-21 DIAGNOSIS — E11621 Type 2 diabetes mellitus with foot ulcer: Secondary | ICD-10-CM | POA: Diagnosis not present

## 2023-11-21 DIAGNOSIS — S91101A Unspecified open wound of right great toe without damage to nail, initial encounter: Secondary | ICD-10-CM | POA: Diagnosis not present

## 2023-11-22 NOTE — Telephone Encounter (Signed)
-----   Message from Kerby Nora sent at 11/22/2023  1:01 PM EDT ----- Regarding: FW: patient update Please call patient to triage  to make sure no active sign of infection and make sure we have correct meds on list... likely will need to increase insulin dose. ----- Message ----- From: Otho Ket, RN Sent: 11/20/2023   4:38 PM EDT To: [name], MD Subject: patient update                                 Hi Dr. Ermalene Searing, I spoke with Mr. Kiang today and he reported having frequent high blood sugars. He states his blood sugars are ranging from 150 to Hi.  He states he has had a couple of high readings.  He also reports waking up in the middle of the night recently with a blood sugar in the 200 range.  He states he is taking all diabetic medication as prescribed and is changing his freestyle libre sensor every 14 days as recommended.  He also reports checking his freestyle libre against his glucometer with only a few point difference.  He is eating peanut butter sandwiches fairly often even prior to bed for a snack and is only eating 1 small meal such as a sandwich and dinner daily. He states he wakes up very late during the day.   He reports having only 1 low blood sugar reading of 68 within the past 2 1/2 weeks.  His next appointment with you is scheduled for 12/07/23. I wanted to provide you this update due to his appointment with you being at least 2 weeks away.    George Ina RN, BSN, CCM [employer], Population Health Case Manager Phone: [phone]

## 2023-11-22 NOTE — Telephone Encounter (Signed)
 Call patient, have him increase Jardiance to 25 mg daily I will send a new prescription.

## 2023-11-22 NOTE — Telephone Encounter (Signed)
[name] notified as instructed by telephone. Patient states understanding.  ?

## 2023-11-22 NOTE — Telephone Encounter (Signed)
 I spoke with pt; pt said he has no signs of infection; no respiratory issues, no UTI symptoms, no open sores and no fever. Pt said taking novolog flexpen 13 units in AM and 3 units in PM; jardiance 10 mg daily in morning, Tresiba 55 units daily in AM. Pt said he feels fine right now and is eating lunch; BS now is 157. [place] [facility]. Please call pt back wit instruction after reviewed by Dr Ermalene Searing.

## 2023-11-27 ENCOUNTER — Other Ambulatory Visit (INDEPENDENT_AMBULATORY_CARE_PROVIDER_SITE_OTHER): Payer: Self-pay | Admitting: Physician Assistant

## 2023-11-27 DIAGNOSIS — E11621 Type 2 diabetes mellitus with foot ulcer: Secondary | ICD-10-CM

## 2023-11-27 DIAGNOSIS — L97513 Non-pressure chronic ulcer of other part of right foot with necrosis of muscle: Secondary | ICD-10-CM

## 2023-11-28 ENCOUNTER — Encounter: Attending: Internal Medicine | Admitting: Internal Medicine

## 2023-11-28 ENCOUNTER — Ambulatory Visit: Payer: PPO | Attending: Cardiology | Admitting: Cardiology

## 2023-11-28 ENCOUNTER — Encounter: Payer: Self-pay | Admitting: Cardiology

## 2023-11-28 VITALS — BP 121/70 | HR 79 | Ht 66.0 in | Wt 266.0 lb

## 2023-11-28 DIAGNOSIS — I480 Paroxysmal atrial fibrillation: Secondary | ICD-10-CM | POA: Insufficient documentation

## 2023-11-28 DIAGNOSIS — E11319 Type 2 diabetes mellitus with unspecified diabetic retinopathy without macular edema: Secondary | ICD-10-CM | POA: Diagnosis not present

## 2023-11-28 DIAGNOSIS — E785 Hyperlipidemia, unspecified: Secondary | ICD-10-CM

## 2023-11-28 DIAGNOSIS — L97513 Non-pressure chronic ulcer of other part of right foot with necrosis of muscle: Secondary | ICD-10-CM | POA: Diagnosis not present

## 2023-11-28 DIAGNOSIS — L97412 Non-pressure chronic ulcer of right heel and midfoot with fat layer exposed: Secondary | ICD-10-CM | POA: Diagnosis not present

## 2023-11-28 DIAGNOSIS — E11621 Type 2 diabetes mellitus with foot ulcer: Secondary | ICD-10-CM | POA: Diagnosis not present

## 2023-11-28 DIAGNOSIS — Z794 Long term (current) use of insulin: Secondary | ICD-10-CM | POA: Diagnosis not present

## 2023-11-28 DIAGNOSIS — I152 Hypertension secondary to endocrine disorders: Secondary | ICD-10-CM | POA: Diagnosis not present

## 2023-11-28 DIAGNOSIS — I2510 Atherosclerotic heart disease of native coronary artery without angina pectoris: Secondary | ICD-10-CM | POA: Insufficient documentation

## 2023-11-28 DIAGNOSIS — I10 Essential (primary) hypertension: Secondary | ICD-10-CM | POA: Insufficient documentation

## 2023-11-28 DIAGNOSIS — E1169 Type 2 diabetes mellitus with other specified complication: Secondary | ICD-10-CM

## 2023-11-28 DIAGNOSIS — E1159 Type 2 diabetes mellitus with other circulatory complications: Secondary | ICD-10-CM

## 2023-11-28 DIAGNOSIS — I739 Peripheral vascular disease, unspecified: Secondary | ICD-10-CM

## 2023-11-28 DIAGNOSIS — Z7901 Long term (current) use of anticoagulants: Secondary | ICD-10-CM | POA: Insufficient documentation

## 2023-11-28 DIAGNOSIS — L97512 Non-pressure chronic ulcer of other part of right foot with fat layer exposed: Secondary | ICD-10-CM | POA: Insufficient documentation

## 2023-11-28 DIAGNOSIS — I5020 Unspecified systolic (congestive) heart failure: Secondary | ICD-10-CM

## 2023-11-28 MED ORDER — NITROGLYCERIN 0.4 MG SL SUBL: SUBLINGUAL_TABLET | SUBLINGUAL | 3 refills | Status: AC

## 2023-11-28 NOTE — Patient Instructions (Addendum)
 Medication Instructions:   Your refill for Nitroglycerin has been refilled and sent to your pharmacy   Your physician recommends that you continue on your current medications as directed. Please refer to the Current Medication list given to you today.  *If you need a refill on your cardiac medications before your next appointment, please call your pharmacy*  Lab Work: No labs ordered today  If you have labs (blood work) drawn today and your tests are completely normal, you will receive your results only by: MyChart Message (if you have MyChart) OR A paper copy in the mail If you have any lab test that is abnormal or we need to change your treatment, we will call you to review the results.  Testing/Procedures: No test ordered today   Follow-Up: At [facility], you and your health needs are our priority.  As part of our continuing mission to provide you with exceptional heart care, our providers are all part of one team.  This team includes your primary Cardiologist (physician) and Advanced Practice Providers or APPs (Physician Assistants and Nurse Practitioners) who all work together to provide you with the care you need, when you need it.  Your next appointment:   3 month(s)  Provider:   You may see Julien Nordmann, MD or one of the following Advanced Practice Providers on your designated Care Team:   Nicolasa Ducking, NP Ames Dura, PA-C Eula Listen, PA-C Cadence Parmele, PA-C Charlsie Quest, NP Carlos Levering, NP    We recommend signing up for the patient portal called "MyChart".  Sign up information is provided on this After Visit Summary.  MyChart is used to connect with patients for Virtual Visits (Telemedicine).  Patients are able to view lab/test results, encounter notes, upcoming appointments, etc.  Non-urgent messages can be sent to your provider as well.   To learn more about what you can do with MyChart, go to [email or web address].

## 2023-11-28 NOTE — Progress Notes (Signed)
 Cardiology Office Note:  .   Date:  11/28/2023  ID:  [name], DOB 02/04/1951, MRN [id number] PCP: [name], MD  [facility] Providers Cardiologist:  Julien Nordmann, MD    History of Present Illness: .   [name] is a 73 y.o. male with past medical history of combined systolic and diastolic congestive heart rate, primary hypertension, coronary artery disease (PCI to the LAD in the past, CTO of the RCA), type II diabetes, obesity, NSTEMI, COVID-19 infection, tachycardia, persistent atrial fibrillation status post cardioversion, no send today for follow-up on his coronary artery disease persistent atrial fibrillation.   Previously hospital last and [facility] in 10/2022.  Midsternal chest discomfort.  High-sensitivity troponin peaked at 1880 and he had a BNP of 1240, respiratory panel was positive for COVID-19. Echocardiogram revealed global hypokinesis with an LVEF of 40-25%, G2 DD, mild to moderate MR.  Recommendation was NSTEMI with low likely due to to demand ischemia from COVID-19 infection he was continued on DAPT.  Would only consider cath if he develops symptoms once he was over acute illness.  He was considered stable and discharged with the facility in 11/08/2022.  Continue to follow with advanced heart failure on an outpatient basis.   He was seen in clinic 01/30/2023 and found to have new onset atrial fibrillation during his visit with advanced heart failure.  He had complaints of fatigue, shortness of breath, peripheral edema.  He was started on apixaban 5 mg twice daily for CHA2DS2-VASc score of at least 5 for stroke prophylaxis.  Was advised that he would stay on that for minimum of 3 to 4 weeks and if he remained in atrial fibrillation symptomatic would need cardioversion procedure completed.  At that time his aspirin 81 mg was discontinued.   He was last seen in clinic 02/24/2023 by Dr. Mariah Milling.  Reported that he was relatively sedentary and asymptomatic from the atrial  fibrillation.  He had lower extremity edema but remained in A-fib on EKG.  His carvedilol was increased to 12.5 mg twice daily and he was continued on apixaban and he was scheduled for cardioversion.  He underwent successful cardioversion on 03/02/2020 for management 1 time 150 J and converted back to normal sinus rhythm.    He was last seen in clinic 05/30/2023 by Dr. Mariah Milling.  At that time he felt well and had no complaints.  He presented with his daughter.  Did have poor sleep hygiene but denied any tachypalpitations concerning for arrhythmia or significant lower extremity edema.  He had been maintaining sinus rhythm since his cardioversion.  Was continued on GDMT.  No further medication changes were made at that time.  He returns to clinic today accompanied by his daughter today.  He denies any chest pain, palpitations, or worsening shortness of breath today.  Chronic shortness of breath that is stable.  He continues to have edema to his bilateral lower extremities since recently just followed up with the clinic.  He stated that he recently presented to the emergency department with complaints of chest pain.  Workup was unrevealing and he left prior to being seen.  Troponins were negative and he was advised that EKG was unchanged.  He stated that it was more like indigestion and he had eaten [place] that night and thinks that that was the culprit.  He stated that he did take one of his Nitrostat but that Nitrostat did not resolve.  He is requesting refill of that today.  Since  that time he has had no further episodes of worsening shortness of breath does continue to complain of fatigue.  States that he has been compliant with the remainder of his medications with without adverse side effects.  States he has not missed any doses of his apixaban and denies any bleeding with no blood noted in his urine or stool.   ROS: 10 point review of system has been reviewed and considered negative with exception was been  listed in the HPI  Studies Reviewed: Marland Kitchen   EKG Interpretation Date/Time:  Tuesday November 28 2023 15:32:13 EDT Ventricular Rate:  79 PR Interval:  274 QRS Duration:  88 QT Interval:  382 QTC Calculation: 438 R Axis:   -19  Text Interpretation: Sinus rhythm with 1st degree A-V block Inferior infarct (cited on or before 09-May-2023) Anterior infarct (cited on or before 09-May-2023) When compared with ECG of 16-Nov-2023 12:09, No significant change was found Confirmed by Charlsie Quest ([id number]) on 11/28/2023 3:37:09 PM    TTE 11/06/22 1. Left ventricular ejection fraction, by estimation, is 40 to 45%. The  left ventricle has mildly decreased function. The left ventricle  demonstrates global hypokinesis. Left ventricular diastolic parameters are  consistent with Grade II diastolic  dysfunction (pseudonormalization). Elevated left atrial pressure.   2. Right ventricular systolic function is normal. The right ventricular  size is normal. Tricuspid regurgitation signal is inadequate for assessing  PA pressure.   3. The mitral valve is abnormal. Mild to moderate mitral valve  regurgitation. No evidence of mitral stenosis.   4. The aortic valve has an indeterminant number of cusps. There is mild  calcification of the aortic valve. There is mild thickening of the aortic  valve. Aortic valve regurgitation is not visualized. No aortic stenosis is  present.   5. Aortic dilatation noted. There is mild dilatation of the ascending  aorta, measuring 38 mm.    Lexiscan MPI 10/25/2017 Pharmacological myocardial perfusion imaging study with large region of fixed perfusion defect in the inferior wall with mild peri-infarct ischemia in the inferolateral region Inferior wall hypokinesis.  EF estimated at 24% No EKG changes concerning for ischemia at peak stress or in recovery. Moderate  risk scan based on low EF and large fixed defect Patient has known RCA occlusion consistent with inferior wall perfusion  defect.  Consider echocardiogram to confirm EF   LHC 04/06/2016 Mid RCA to Dist RCA lesion, 100 %stenosed. Mid LAD-2 lesion, 60 %stenosed. Mid LAD-1 lesion, 20 %stenosed. The left ventricular systolic function is normal. The left ventricular ejection fraction is 50-55% by visual estimate. LV end diastolic pressure is normal. 2nd [name] lesion, 75 %stenosed.   Chronic total occlusion of the right coronary in the proximal segment well collateralized from the left circumflex and LAD. Widely patent proximal to mid LAD stent previously placed in July. There is first diagonal diagonal and LAD 30 and 50% narrowing respectively. Widely patent circumflex unchanged from previous with 70% narrowing in a small branch of the first marginal. Circumflex collateralizes the distal right coronary left ventricular branch. Inferobasal hypokinesis. EF 50%. EDP is normal. Unable to identify a significant change in the angiographic appearance since prior study in July.   RECOMMENDATIONS:   Continuation of dual antiplatelet therapy is stressed. Further management per treating team. Risk Assessment/Calculations:    CHA2DS2-VASc Score = 5   This indicates a 7.2% annual risk of stroke. [name] score is based upon: CHF History: 1 HTN History: 1 Diabetes History: 1 Stroke History: 0  Vascular Disease History: 1 Age Score: 1 Gender Score: 0            Physical Exam:   VS:  BP 121/70   Pulse 79   Ht 5\' 6"  (1.676 m)   Wt 266 lb (120.7 kg)   SpO2 96%   BMI 42.93 kg/m    Wt Readings from Last 3 Encounters:  11/28/23 266 lb (120.7 kg)  11/03/23 258 lb (117 kg)  10/23/23 269 lb 3.2 oz (122.1 kg)    GEN: Well nourished, well developed in no acute distress NECK: No JVD; No carotid bruits CARDIAC: RRR, no murmurs, rubs, gallops RESPIRATORY:  Clear to auscultation without rales, wheezing or rhonchi  ABDOMEN: Soft, non-tender, obese, non-distended EXTREMITIES: Trace pitting edema edema; No deformity    ASSESSMENT AND PLAN: .   Paroxysmal atrial fibrillation status post prior cardioversion continues to maintain sinus rhythm on EKG with first-degree AV block with a rate of 82 old inferior and anterior infarct.  He is continued on apixaban 5 mg twice daily for CHA2DS2-VASc release 5 for stroke prophylaxis.  Denies any bleeding or any blood noted in his urine or stool.  Denies any recurrent palpitations or worsening shortness of breath to suggest he has had an out of A-fib.  Coronary artery disease of native coronary artery of native heart with stable angina.  Prior echocardiogram completed 3/24 revealed an LVEF of 40/45%.  Denies any worsening shortness of breath or angina today.  EKG today shows no significant changes.  He is continued on apixaban and lieu of aspirin, clopidogrel 75 mg daily, lisinopril 10 mg daily, Imdur 30 mg twice daily.  Since he was evaluated in the emergency department with complaints of chest pain with negative workup we did offer stress testing today.  Since he is only had 1 episode that he he believes was related to [place] that he had eaten the night, further ischemic testing has been deferred.  He has been advised that he has any other episodes that repeat testing would be indicated for sinus stress test was done in 2019.  A new prescription for his Nitrostat was sent to the pharmacy of choice per the patient's request.  Mixed hyperlipidemia with a goal of less than 70 ideally 55 with his history of diabetes.  He is continued on ezetimibe 10 mg daily as he has not statin intolerance.  Last LDL was 114 on 09/05/2023.  Will discuss PCSK9 inhibitor on return.  Primary hypertension with blood pressure today 121/70.  Blood pressures remain stable.  He is continued on carvedilol 625 mg along with Entresto 24/26 mg twice daily and spironolactone 25 mg daily, furosemide 40 mg daily and Imdur 30 mg twice daily. He has been encouraged to continue to monitor his blood pressure 1 to 2  hours postmedication administration as well.  Type 2 diabetes where his hemoglobin A1c continues to improve.  He is continued on insulin, Tresiba, Jardiance.  Ongoing management per PCP.  Chronic HFrEF with an LVEF of 40 to 25% on previous echocardiogram.  He is continued on GDMT with Entresto 24/26 mg twice daily, spironolactone 25 mg daily Jardiance 25 of milligrams, furosemide 40 mg daily and carvedilol 6.25 mg twice daily.  Continues to have dietary indiscretion and eats out a lot.  Overall euvolemic on exam today.  Encouraged to continue to follow with [facility].  PAD.  Continues to follow with vascular and goes to the wound center.  Right leg arteriogram and angioplasty  of right posterior tibial artery and right popliteal artery in 03/27/2023.  He is continued on complex 75 mg daily.  Upcoming ABIs have been ordered by vascular.  Morbid obesity with a BMI of 42.93, which overall complicates prognoses.  Would benefit from decreased weight.       Dispo: Patient return to clinic with MD/APP in 3 months or sooner if needed for reevaluation   Signed, Lariya Kinzie, NP

## 2023-11-30 ENCOUNTER — Other Ambulatory Visit (INDEPENDENT_AMBULATORY_CARE_PROVIDER_SITE_OTHER): Payer: PPO

## 2023-11-30 ENCOUNTER — Encounter: Payer: Self-pay | Admitting: Family Medicine

## 2023-11-30 DIAGNOSIS — Z794 Long term (current) use of insulin: Secondary | ICD-10-CM

## 2023-11-30 DIAGNOSIS — E11319 Type 2 diabetes mellitus with unspecified diabetic retinopathy without macular edema: Secondary | ICD-10-CM | POA: Diagnosis not present

## 2023-11-30 LAB — COMPREHENSIVE METABOLIC PANEL WITH GFR
ALT: 17 U/L (ref 0–53)
AST: 13 U/L (ref 0–37)
Albumin: 3.7 g/dL (ref 3.5–5.2)
Alkaline Phosphatase: 79 U/L (ref 39–117)
BUN: 16 mg/dL (ref 6–23)
CO2: 33 meq/L — ABNORMAL HIGH (ref 19–32)
Calcium: 9.1 mg/dL (ref 8.4–10.5)
Chloride: 103 meq/L (ref 96–112)
Creatinine, Ser: 1.02 mg/dL (ref 0.40–1.50)
GFR: 72.63 mL/min (ref >60.00)
Glucose, Bld: 138 mg/dL — ABNORMAL HIGH (ref 70–99)
Potassium: 4.4 meq/L (ref 3.5–5.1)
Sodium: 142 meq/L (ref 135–145)
Total Bilirubin: 0.4 mg/dL (ref 0.2–1.2)
Total Protein: 6.6 g/dL (ref 6.0–8.3)

## 2023-11-30 LAB — LIPID PANEL
Cholesterol: 146 mg/dL (ref 0–200)
HDL: 37.6 mg/dL — ABNORMAL LOW (ref >39.00)
LDL Cholesterol: 92 mg/dL (ref 0–99)
NonHDL: 108.25
Total CHOL/HDL Ratio: 4
Triglycerides: 83 mg/dL (ref 0.0–149.0)
VLDL: 16.6 mg/dL (ref 0.0–40.0)

## 2023-11-30 LAB — HEMOGLOBIN A1C: Hgb A1c MFr Bld: 7.9 % — ABNORMAL HIGH (ref 4.6–6.5)

## 2023-12-01 ENCOUNTER — Ambulatory Visit (INDEPENDENT_AMBULATORY_CARE_PROVIDER_SITE_OTHER)

## 2023-12-01 ENCOUNTER — Other Ambulatory Visit: Payer: Self-pay | Admitting: Family Medicine

## 2023-12-01 DIAGNOSIS — E1169 Type 2 diabetes mellitus with other specified complication: Secondary | ICD-10-CM | POA: Diagnosis not present

## 2023-12-01 DIAGNOSIS — L97513 Non-pressure chronic ulcer of other part of right foot with necrosis of muscle: Secondary | ICD-10-CM | POA: Diagnosis not present

## 2023-12-01 DIAGNOSIS — M869 Osteomyelitis, unspecified: Secondary | ICD-10-CM

## 2023-12-01 DIAGNOSIS — L97509 Non-pressure chronic ulcer of other part of unspecified foot with unspecified severity: Secondary | ICD-10-CM

## 2023-12-01 DIAGNOSIS — E11621 Type 2 diabetes mellitus with foot ulcer: Secondary | ICD-10-CM | POA: Diagnosis not present

## 2023-12-04 LAB — VAS US ABI WITH/WO TBI
Left ABI: 0.77
Right ABI: 1.41

## 2023-12-05 ENCOUNTER — Encounter: Admitting: Physician Assistant

## 2023-12-05 DIAGNOSIS — E11621 Type 2 diabetes mellitus with foot ulcer: Secondary | ICD-10-CM | POA: Diagnosis not present

## 2023-12-05 DIAGNOSIS — L97512 Non-pressure chronic ulcer of other part of right foot with fat layer exposed: Secondary | ICD-10-CM | POA: Diagnosis not present

## 2023-12-05 DIAGNOSIS — L97412 Non-pressure chronic ulcer of right heel and midfoot with fat layer exposed: Secondary | ICD-10-CM | POA: Diagnosis not present

## 2023-12-05 DIAGNOSIS — L97513 Non-pressure chronic ulcer of other part of right foot with necrosis of muscle: Secondary | ICD-10-CM | POA: Diagnosis not present

## 2023-12-06 ENCOUNTER — Other Ambulatory Visit: Payer: Self-pay

## 2023-12-06 ENCOUNTER — Emergency Department

## 2023-12-06 ENCOUNTER — Emergency Department
Admission: EM | Admit: 2023-12-06 | Discharge: 2023-12-06 | Disposition: A | Discharge status: Home / Self Care | Attending: Emergency Medicine | Admitting: Emergency Medicine

## 2023-12-06 ENCOUNTER — Telehealth: Payer: Self-pay

## 2023-12-06 DIAGNOSIS — E119 Type 2 diabetes mellitus without complications: Secondary | ICD-10-CM | POA: Insufficient documentation

## 2023-12-06 DIAGNOSIS — I2510 Atherosclerotic heart disease of native coronary artery without angina pectoris: Secondary | ICD-10-CM | POA: Diagnosis not present

## 2023-12-06 DIAGNOSIS — R0902 Hypoxemia: Secondary | ICD-10-CM | POA: Diagnosis not present

## 2023-12-06 DIAGNOSIS — Y92410 Unspecified street and highway as the place of occurrence of the external cause: Secondary | ICD-10-CM | POA: Diagnosis not present

## 2023-12-06 DIAGNOSIS — I10 Essential (primary) hypertension: Secondary | ICD-10-CM | POA: Insufficient documentation

## 2023-12-06 DIAGNOSIS — R519 Headache, unspecified: Secondary | ICD-10-CM | POA: Diagnosis not present

## 2023-12-06 DIAGNOSIS — G319 Degenerative disease of nervous system, unspecified: Secondary | ICD-10-CM | POA: Diagnosis not present

## 2023-12-06 DIAGNOSIS — S0990XA Unspecified injury of head, initial encounter: Secondary | ICD-10-CM | POA: Diagnosis not present

## 2023-12-06 DIAGNOSIS — I6782 Cerebral ischemia: Secondary | ICD-10-CM | POA: Diagnosis not present

## 2023-12-06 DIAGNOSIS — S199XXA Unspecified injury of neck, initial encounter: Secondary | ICD-10-CM | POA: Diagnosis not present

## 2023-12-06 DIAGNOSIS — M542 Cervicalgia: Secondary | ICD-10-CM | POA: Diagnosis not present

## 2023-12-06 DIAGNOSIS — M25561 Pain in right knee: Secondary | ICD-10-CM | POA: Diagnosis not present

## 2023-12-06 DIAGNOSIS — S82091A Other fracture of right patella, initial encounter for closed fracture: Secondary | ICD-10-CM | POA: Diagnosis not present

## 2023-12-06 DIAGNOSIS — M1711 Unilateral primary osteoarthritis, right knee: Secondary | ICD-10-CM | POA: Diagnosis not present

## 2023-12-06 MED ORDER — KETOROLAC TROMETHAMINE 15 MG/ML IJ SOLN
15.0000 mg | Freq: Once | INTRAMUSCULAR | Status: AC
  Administered 2023-12-06: 15 mg via INTRAMUSCULAR
  Filled 2023-12-06: qty 1

## 2023-12-06 MED ORDER — ACETAMINOPHEN 500 MG PO TABS
1000.0000 mg | ORAL_TABLET | Freq: Once | ORAL | Status: AC
  Administered 2023-12-06: 1000 mg via ORAL
  Filled 2023-12-06: qty 2
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
## 2023-12-06 NOTE — Progress Notes (Signed)
 Complex Care Management Care Guide Note  12/06/2023 Name: HUNTLEY KNOOP MRN: [id number] DOB: 11/05/1950  [name] is a 73 y.o. year old male who is a primary care patient of Bedsole, Amy E, MD and is actively engaged with the care management team. I reached out to [name] by phone today to assist with re-scheduling  with the Licensed Clinical Child psychotherapist.  Follow up plan: Unsuccessful telephone outreach attempt made. A HIPAA compliant phone message was left for the patient providing contact information and requesting a return call.  Penne Lash , RMA     [facility]  [facility] Guide  Direct Dial: [phone]  Website: [email or web address]
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
## 2023-12-07 ENCOUNTER — Encounter: Payer: Self-pay | Admitting: Family Medicine

## 2023-12-07 ENCOUNTER — Ambulatory Visit: Payer: PPO | Admitting: Family Medicine

## 2023-12-07 VITALS — BP 102/60 | HR 85 | Temp 98.2°F | Ht 66.0 in | Wt 261.4 lb

## 2023-12-07 DIAGNOSIS — E785 Hyperlipidemia, unspecified: Secondary | ICD-10-CM

## 2023-12-07 DIAGNOSIS — M25561 Pain in right knee: Secondary | ICD-10-CM

## 2023-12-07 DIAGNOSIS — Z794 Long term (current) use of insulin: Secondary | ICD-10-CM

## 2023-12-07 DIAGNOSIS — I152 Hypertension secondary to endocrine disorders: Secondary | ICD-10-CM

## 2023-12-07 DIAGNOSIS — M549 Dorsalgia, unspecified: Secondary | ICD-10-CM

## 2023-12-07 DIAGNOSIS — E11319 Type 2 diabetes mellitus with unspecified diabetic retinopathy without macular edema: Secondary | ICD-10-CM | POA: Diagnosis not present

## 2023-12-07 DIAGNOSIS — E1159 Type 2 diabetes mellitus with other circulatory complications: Secondary | ICD-10-CM | POA: Diagnosis not present

## 2023-12-07 DIAGNOSIS — E1169 Type 2 diabetes mellitus with other specified complication: Secondary | ICD-10-CM | POA: Diagnosis not present
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
## 2023-12-07 NOTE — Patient Instructions (Addendum)
 Can use muscle relaxant at night as needed.  Start stretching exercises of neck.  Can apply heat on neck and ice right knee. Can [place] diclofenac (Volatren) gel up to four times a day on right knee.  Increase  Novolog to 14 UNits in Monrning.. to control spike of sugar after breakfast and lunch.. can titrate up a few units as long as no lows later in the day < 60

## 2023-12-07 NOTE — Assessment & Plan Note (Signed)
Chronic, inadequate control with Zetia.  Would definitely suggest consideration of PCSK9 inhibitor.  If cardiologist not looking into this in the near future I will move forward with it.

## 2023-12-07 NOTE — Assessment & Plan Note (Addendum)
 Chronic, well-controlled in office today  Coreg 6.25 milligrams p.o. twice daily, losartan 50 mg p.o. daily  Lasix 40 mg daily Spironolactone 25 mg p.o. daily  Entresto 24/26 mg daily

## 2023-12-07 NOTE — Assessment & Plan Note (Addendum)
 Chronic, significant improvement in  control  with increased dose of jardiance. Attempted to download his freestyle Josephine Igo was unable to with his reader. Appears blood sugars rise primarily around 8 to 11:00 in the morning .  With some dropping lower overnight. To avoid lows I will have him try to increase his a.m. NovoLog gradually to target midday highs.  Trulicity 4.5 mg weekly. Tresiba 55 units and NovoLog  13 untis in Ama nd 3 units later in day prn Jardiance  25 mg p.o. daily   Reevaluate in 3 months.   Associated with retinopathy.

## 2023-12-11 ENCOUNTER — Other Ambulatory Visit: Payer: Self-pay | Admitting: Family Medicine

## 2023-12-12 ENCOUNTER — Ambulatory Visit (INDEPENDENT_AMBULATORY_CARE_PROVIDER_SITE_OTHER): Admitting: Vascular Surgery

## 2023-12-12 ENCOUNTER — Encounter (INDEPENDENT_AMBULATORY_CARE_PROVIDER_SITE_OTHER): Payer: Self-pay | Admitting: Vascular Surgery

## 2023-12-12 VITALS — BP 121/72 | HR 74 | Resp 18 | Ht 66.0 in | Wt 266.0 lb

## 2023-12-12 DIAGNOSIS — Z794 Long term (current) use of insulin: Secondary | ICD-10-CM | POA: Diagnosis not present

## 2023-12-12 DIAGNOSIS — E1159 Type 2 diabetes mellitus with other circulatory complications: Secondary | ICD-10-CM | POA: Diagnosis not present

## 2023-12-12 DIAGNOSIS — E11319 Type 2 diabetes mellitus with unspecified diabetic retinopathy without macular edema: Secondary | ICD-10-CM

## 2023-12-12 DIAGNOSIS — E11621 Type 2 diabetes mellitus with foot ulcer: Secondary | ICD-10-CM

## 2023-12-12 DIAGNOSIS — L97419 Non-pressure chronic ulcer of right heel and midfoot with unspecified severity: Secondary | ICD-10-CM

## 2023-12-12 DIAGNOSIS — I152 Hypertension secondary to endocrine disorders: Secondary | ICD-10-CM | POA: Diagnosis not present

## 2023-12-12 DIAGNOSIS — I7025 Atherosclerosis of native arteries of other extremities with ulceration: Secondary | ICD-10-CM

## 2023-12-12 NOTE — Patient Instructions (Signed)
Angiogram  An angiogram is a procedure used to examine the blood vessels. In this procedure, contrast dye is injected through a soft tube (catheter) into an artery. X-rays are then taken, which show if there is a blockage or problem in a blood vessel. The catheter may be inserted in: The groin area. This is the most common. The fold of the arm, near the elbow. The wrist. Tell a health care provider about: Any allergies you have, including allergies to medicines, shellfish, contrast dye, or iodine. All medicines you are taking, including vitamins, herbs, eye drops, creams, and over-the-counter medicines. Any problems you or family members have had with anesthesia. Any bleeding problems you have. Any surgeries you have had. Any medical conditions you have or have had, including any kidney problems or kidney failure. Whether you are pregnant or may be pregnant. Whether you are breastfeeding. Any condition that may increase your stress and prevent you from lying still. This includes anxiety disorders or chronic pain. What are the risks? Your health care provider will talk with you about risks. These may include: Infection. Bleeding and bruising. Allergic reactions to medicines or dyes, including the contrast dye used. Damage to nearby structures or organs, including damage to blood vessels and kidney damage from the contrast dye. Blood clots that can lead to a stroke or heart attack. Death. What happens before the procedure? When to stop eating and drinking Follow instructions from your health care provider about what you may eat and drink before your procedure. These may include: 8 hours before your procedure Stop eating most foods. Do not eat meat, fried foods, or fatty foods. Eat only light foods, such as toast or crackers. All liquids are okay except energy drinks and alcohol. 6 hours before your procedure Stop eating. Drink only clear liquids, such as water, clear fruit juice,  black coffee, plain tea, and sports drinks. Do not drink energy drinks or alcohol. 2 hours before your procedure Stop drinking all liquids. You may be allowed to take medicines with small sips of water. If you do not follow your health care provider's instructions, your procedure may be delayed or canceled. Medicines Ask your health care provider about: Changing or stopping your regular medicines. These include any diabetes medicines or blood thinners you take. Taking medicines such as aspirin and ibuprofen. These medicines can thin your blood. Do not take them unless your health care provider tells you to. Taking over-the-counter medicines, vitamins, herbs, and supplements. Surgery safety Ask your health care provider: How your insertion site will be marked. What steps will be taken to help prevent infection. These may include: Removing hair at the insertion site. Washing skin with a germ-killing soap. General instructions Do not use any products that contain nicotine or tobacco for at least 4 weeks before the procedure. These products include cigarettes, chewing tobacco, and vaping devices, such as e-cigarettes. If you need help quitting, ask your health care provider. You may have blood samples taken. If you will be going home right after the procedure, plan to have a responsible adult: Take you home from the hospital or clinic. You will not be allowed to drive. Care for you for the time you are told. What happens during the procedure? You will lie on your back on an X-ray table. You may be strapped to the table if it is tilted. An IV will be inserted into one of your veins. Electrodes may be placed on your chest to monitor your heart rate during the procedure.  You will be given one or both of the following: A sedative. This helps you relax. Anesthesia. This will numb the area where the catheter will be inserted. A small incision will be made for catheter insertion. The catheter  will be inserted into an artery using a guide wire. An X-ray procedure (fluoroscopy) will be used to help guide the catheter to the blood vessel to be examined. A contrast dye will then be injected into the catheter, and X-rays will be taken. The contrast will help to show where any narrowing or blockages are located in the blood vessels. You may feel flushed as the contrast dye is injected. Tell your health care provider if you develop chest pain or trouble breathing. After the fluoroscopy is complete, the catheter will be removed. Pressure will be applied to stop bleeding. A closure device may also be used. A bandage (dressing) will be placed over the site where the catheter was inserted. The procedure may vary among health care providers and hospitals. What happens after the procedure? Your blood pressure, heart rate, breathing rate, and blood oxygen level will be monitored until you leave the hospital or clinic. You will be kept in bed lying flat for a period of time. If the catheter was inserted through your leg, you will be instructed not to bend or cross your legs. The insertion area and the pulse in your feet or wrist will be checked often. You will be instructed to drink plenty of fluids. This will help wash the contrast dye out of your body. You may have more blood tests and X-rays. You may also have a test that records the electrical activity of your heart (electrocardiogram, or ECG). If you were given a sedative, do not drive or operate machinery until your health care provider says that it is safe. You may have to avoid lifting. Ask your health care provider how much you can safely lift. It is up to you to get your test results. Ask your health care provider, or the department that is doing the test, when your results will be ready. This information is not intended to replace advice given to you by your health care provider. Make sure you discuss any questions you have with your health  care provider. Document Revised: 03/09/2022 Document Reviewed: 03/09/2022 Elsevier Patient Education  2024 [employer].

## 2023-12-12 NOTE — Assessment & Plan Note (Signed)
 Noninvasive study shows falsely elevated right ABI of 1.41.  His waveforms remain fairly poor and his digital pressure is only 54.  His left ABI 0.77 with a digit pressure of 64. This represents a critical limb threatening situation with a very high risk of limb loss.  I discussed the serious nature of the situation with he and his family today.  He had significant disease treated about 6 months ago, with much of it being tibial disease.  I suspect this has recurred.  I would recommend an angiogram with possible revascularization of the right lower extremity.  I discussed without revascularization, limb loss is highly likely.  I discussed that even with revascularization, limb loss is still a possibility.  His left lower extremity perfusion is not great, but he has no current limb threatening symptoms on the left so we will not entertain any intervention on that side for now.  Angiogram will be performed in the near future at his convenience.

## 2023-12-12 NOTE — Assessment & Plan Note (Signed)
 blood glucose control important in reducing the progression of atherosclerotic disease. Also, involved in wound healing. On appropriate medications.

## 2023-12-12 NOTE — Assessment & Plan Note (Signed)
 blood pressure control important in reducing the progression of atherosclerotic disease. On appropriate oral medications.

## 2023-12-12 NOTE — Assessment & Plan Note (Signed)
 Noninvasive study shows falsely elevated right ABI of 1.41.  His waveforms remain fairly poor and his digital pressure is only 54.  His left ABI 0.77 with a digit pressure of 64.  This represents a critical limb threatening situation.  Revascularization planned as above.
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
## 2023-12-13 ENCOUNTER — Telehealth (INDEPENDENT_AMBULATORY_CARE_PROVIDER_SITE_OTHER): Payer: Self-pay

## 2023-12-13 ENCOUNTER — Ambulatory Visit: Payer: Self-pay

## 2023-12-13 NOTE — Telephone Encounter (Signed)
 Spoke with the patient and he is scheduled with Dr. Vonna Guardian for a right leg angio on 12/25/23 with a 11:30 am to the [facility]. Pre-procedure instructions were discussed and will be sent to Mychart and mailed.

## 2023-12-13 NOTE — Telephone Encounter (Signed)
 This RN called [facility] and advised them that the patient did not want to go to the Emergency Room.
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
## 2023-12-14 ENCOUNTER — Encounter: Admitting: Physician Assistant

## 2023-12-14 DIAGNOSIS — E11621 Type 2 diabetes mellitus with foot ulcer: Secondary | ICD-10-CM | POA: Diagnosis not present

## 2023-12-14 DIAGNOSIS — L97513 Non-pressure chronic ulcer of other part of right foot with necrosis of muscle: Secondary | ICD-10-CM | POA: Diagnosis not present

## 2023-12-14 DIAGNOSIS — L97512 Non-pressure chronic ulcer of other part of right foot with fat layer exposed: Secondary | ICD-10-CM | POA: Diagnosis not present

## 2023-12-14 DIAGNOSIS — L97412 Non-pressure chronic ulcer of right heel and midfoot with fat layer exposed: Secondary | ICD-10-CM | POA: Diagnosis not present

## 2023-12-14 NOTE — Telephone Encounter (Signed)
 Call pt to have him make follow up for re-eval of issue if worsening next week.. If pain severe got to ER.

## 2023-12-14 NOTE — Telephone Encounter (Signed)
 Left message to call office.   If pt calls back, please advise him of Dr Millie Alm note.

## 2023-12-18 NOTE — Telephone Encounter (Signed)
 Noted.
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
## 2023-12-19 ENCOUNTER — Other Ambulatory Visit: Payer: Self-pay | Admitting: *Deleted

## 2023-12-19 ENCOUNTER — Ambulatory Visit: Admitting: Family Medicine

## 2023-12-19 ENCOUNTER — Encounter: Payer: Self-pay | Admitting: Family Medicine

## 2023-12-19 VITALS — BP 104/60 | HR 73 | Temp 97.7°F | Ht 66.0 in

## 2023-12-19 DIAGNOSIS — M542 Cervicalgia: Secondary | ICD-10-CM

## 2023-12-19 DIAGNOSIS — M25561 Pain in right knee: Secondary | ICD-10-CM | POA: Diagnosis not present

## 2023-12-19 MED ORDER — PREDNISONE 20 MG PO TABS: ORAL_TABLET | ORAL | 0 refills | Status: DC

## 2023-12-19 MED ORDER — CYCLOBENZAPRINE HCL 10 MG PO TABS: 5.0000 mg | ORAL_TABLET | Freq: Every evening | ORAL | 0 refills | Status: DC | PRN

## 2023-12-19 MED ORDER — HYDROCODONE-ACETAMINOPHEN 10-325 MG PO TABS: 0.5000 | ORAL_TABLET | Freq: Three times a day (TID) | ORAL | 0 refills | Status: DC | PRN
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
## 2023-12-21 NOTE — Progress Notes (Signed)
 Complex Care Management Care Guide Note  12/21/2023 Name: SULO JANCZAK MRN: [id number] DOB: Mar 02, 1951  Augustine Blocker is a 73 y.o. year old male who is a primary care patient of Bedsole, Amy E, MD and is actively engaged with the care management team. I reached out to Augustine Blocker by phone today to assist with re-scheduling  with the Licensed Clinical Child psychotherapist.  Follow up plan: Unsuccessful telephone outreach attempt made. A HIPAA compliant phone message was left for the patient providing contact information and requesting a return call.  Lenton Rail , RMA     [facility]  [facility], [facility] Guide  Direct Dial: [phone]  Website: [email or web address]

## 2023-12-22 ENCOUNTER — Other Ambulatory Visit: Payer: Self-pay | Admitting: Family Medicine

## 2023-12-25 ENCOUNTER — Ambulatory Visit
Admission: RE | Admit: 2023-12-25 | Discharge: 2023-12-25 | Disposition: A | Discharge status: Home / Self Care | Attending: Vascular Surgery | Admitting: Vascular Surgery

## 2023-12-25 ENCOUNTER — Encounter
Admission: RE | Disposition: A | Discharge status: Home / Self Care | Payer: Self-pay | Source: Home / Self Care | Attending: Vascular Surgery

## 2023-12-25 ENCOUNTER — Encounter: Payer: Self-pay | Admitting: Vascular Surgery

## 2023-12-25 ENCOUNTER — Other Ambulatory Visit: Payer: Self-pay

## 2023-12-25 DIAGNOSIS — L97909 Non-pressure chronic ulcer of unspecified part of unspecified lower leg with unspecified severity: Secondary | ICD-10-CM

## 2023-12-25 DIAGNOSIS — L97419 Non-pressure chronic ulcer of right heel and midfoot with unspecified severity: Secondary | ICD-10-CM | POA: Insufficient documentation

## 2023-12-25 DIAGNOSIS — Z794 Long term (current) use of insulin: Secondary | ICD-10-CM | POA: Diagnosis not present

## 2023-12-25 DIAGNOSIS — I509 Heart failure, unspecified: Secondary | ICD-10-CM | POA: Insufficient documentation

## 2023-12-25 DIAGNOSIS — I110 Hypertensive heart disease with heart failure: Secondary | ICD-10-CM | POA: Insufficient documentation

## 2023-12-25 DIAGNOSIS — E1151 Type 2 diabetes mellitus with diabetic peripheral angiopathy without gangrene: Secondary | ICD-10-CM | POA: Insufficient documentation

## 2023-12-25 DIAGNOSIS — I70234 Atherosclerosis of native arteries of right leg with ulceration of heel and midfoot: Secondary | ICD-10-CM | POA: Insufficient documentation

## 2023-12-25 DIAGNOSIS — Z7985 Long-term (current) use of injectable non-insulin antidiabetic drugs: Secondary | ICD-10-CM | POA: Insufficient documentation

## 2023-12-25 DIAGNOSIS — E11621 Type 2 diabetes mellitus with foot ulcer: Secondary | ICD-10-CM | POA: Insufficient documentation

## 2023-12-25 DIAGNOSIS — E1140 Type 2 diabetes mellitus with diabetic neuropathy, unspecified: Secondary | ICD-10-CM | POA: Diagnosis not present

## 2023-12-25 DIAGNOSIS — I70235 Atherosclerosis of native arteries of right leg with ulceration of other part of foot: Secondary | ICD-10-CM | POA: Diagnosis not present

## 2023-12-25 DIAGNOSIS — Z7984 Long term (current) use of oral hypoglycemic drugs: Secondary | ICD-10-CM | POA: Insufficient documentation

## 2023-12-25 DIAGNOSIS — Z79899 Other long term (current) drug therapy: Secondary | ICD-10-CM | POA: Insufficient documentation

## 2023-12-25 DIAGNOSIS — M549 Dorsalgia, unspecified: Secondary | ICD-10-CM | POA: Insufficient documentation

## 2023-12-25 DIAGNOSIS — L97519 Non-pressure chronic ulcer of other part of right foot with unspecified severity: Secondary | ICD-10-CM | POA: Diagnosis not present

## 2023-12-25 DIAGNOSIS — E11319 Type 2 diabetes mellitus with unspecified diabetic retinopathy without macular edema: Secondary | ICD-10-CM | POA: Insufficient documentation

## 2023-12-25 HISTORY — PX: LOWER EXTREMITY ANGIOGRAPHY: CATH118251

## 2023-12-25 HISTORY — PX: LOWER EXTREMITY INTERVENTION: CATH118252

## 2023-12-25 LAB — CREATININE, SERUM
Creatinine, Ser: 1.05 mg/dL (ref 0.61–1.24)
GFR, Estimated: >60 mL/min (ref >60)

## 2023-12-25 LAB — GLUCOSE, CAPILLARY
Glucose-Capillary: 101 mg/dL — ABNORMAL HIGH (ref 70–99)
Glucose-Capillary: 66 mg/dL — ABNORMAL LOW (ref 70–99)
Glucose-Capillary: 68 mg/dL — ABNORMAL LOW (ref 70–99)
Glucose-Capillary: 121 mg/dL — ABNORMAL HIGH (ref 70–99)

## 2023-12-25 LAB — BUN: BUN: 27 mg/dL — ABNORMAL HIGH (ref 8–23)

## 2023-12-25 SURGERY — LOWER EXTREMITY INTERVENTION
Anesthesia: Moderate Sedation | Site: Leg Lower | Laterality: Right

## 2023-12-25 MED ORDER — DEXTROSE 50 % IV SOLN
INTRAVENOUS | Status: AC
  Filled 2023-12-25: qty 50

## 2023-12-25 MED ORDER — MIDAZOLAM HCL 2 MG/2ML IJ SOLN
INTRAMUSCULAR | Status: DC | PRN
  Administered 2023-12-25 (×3): 1 mg via INTRAVENOUS

## 2023-12-25 MED ORDER — LABETALOL HCL 5 MG/ML IV SOLN: 10.0000 mg | INTRAVENOUS | Status: DC | PRN

## 2023-12-25 MED ORDER — HEPARIN SODIUM (PORCINE) 1000 UNIT/ML IJ SOLN
INTRAMUSCULAR | Status: DC | PRN
  Administered 2023-12-25: 5000 [IU] via INTRAVENOUS

## 2023-12-25 MED ORDER — MIDAZOLAM HCL 2 MG/ML PO SYRP: 8.0000 mg | ORAL_SOLUTION | Freq: Once | ORAL | Status: DC | PRN

## 2023-12-25 MED ORDER — SODIUM CHLORIDE 0.9 % IV SOLN: 250.0000 mL | INTRAVENOUS | Status: DC | PRN

## 2023-12-25 MED ORDER — HYDRALAZINE HCL 20 MG/ML IJ SOLN: 5.0000 mg | INTRAMUSCULAR | Status: DC | PRN

## 2023-12-25 MED ORDER — MIDAZOLAM HCL 5 MG/5ML IJ SOLN
INTRAMUSCULAR | Status: AC
  Filled 2023-12-25: qty 5

## 2023-12-25 MED ORDER — FAMOTIDINE 20 MG PO TABS: 40.0000 mg | ORAL_TABLET | Freq: Once | ORAL | Status: DC | PRN

## 2023-12-25 MED ORDER — CEFAZOLIN SODIUM-DEXTROSE 2-4 GM/100ML-% IV SOLN
2.0000 g | INTRAVENOUS | Status: AC
  Administered 2023-12-25: 2 g via INTRAVENOUS

## 2023-12-25 MED ORDER — DEXTROSE 50 % IV SOLN
12.5000 g | Freq: Once | INTRAVENOUS | Status: AC
  Administered 2023-12-25: 12.5 g via INTRAVENOUS

## 2023-12-25 MED ORDER — HEPARIN SODIUM (PORCINE) 1000 UNIT/ML IJ SOLN
INTRAMUSCULAR | Status: AC
  Filled 2023-12-25: qty 10

## 2023-12-25 MED ORDER — FENTANYL CITRATE (PF) 100 MCG/2ML IJ SOLN
INTRAMUSCULAR | Status: AC
Start: 2023-12-25 — End: ?
  Filled 2023-12-25: qty 2

## 2023-12-25 MED ORDER — FENTANYL CITRATE (PF) 100 MCG/2ML IJ SOLN
INTRAMUSCULAR | Status: DC | PRN
  Administered 2023-12-25 (×2): 25 ug via INTRAVENOUS
  Administered 2023-12-25: 50 ug via INTRAVENOUS

## 2023-12-25 MED ORDER — SODIUM CHLORIDE 0.9 % IV SOLN: INTRAVENOUS | Status: DC

## 2023-12-25 MED ORDER — DIPHENHYDRAMINE HCL 50 MG/ML IJ SOLN
50.0000 mg | Freq: Once | INTRAMUSCULAR | Status: DC | PRN
Start: 2023-12-25 — End: 2023-12-25

## 2023-12-25 MED ORDER — METHYLPREDNISOLONE SODIUM SUCC 125 MG IJ SOLR
125.0000 mg | Freq: Once | INTRAMUSCULAR | Status: DC | PRN
Start: 2023-12-25 — End: 2023-12-25

## 2023-12-25 MED ORDER — SODIUM CHLORIDE 0.9% FLUSH: 3.0000 mL | Freq: Two times a day (BID) | INTRAVENOUS | Status: DC

## 2023-12-25 MED ORDER — ACETAMINOPHEN 325 MG PO TABS: 650.0000 mg | ORAL_TABLET | ORAL | Status: DC | PRN

## 2023-12-25 MED ORDER — IODIXANOL 320 MG/ML IV SOLN
INTRAVENOUS | Status: DC | PRN
Start: 2023-12-25 — End: 2023-12-25
  Administered 2023-12-25: 40 mL via INTRA_ARTERIAL

## 2023-12-25 MED ORDER — HEPARIN (PORCINE) IN NACL 2000-0.9 UNIT/L-% IV SOLN
INTRAVENOUS | Status: DC | PRN
  Administered 2023-12-25: 1000 mL

## 2023-12-25 MED ORDER — LIDOCAINE-EPINEPHRINE (PF) 1 %-1:200000 IJ SOLN
INTRAMUSCULAR | Status: DC | PRN
  Administered 2023-12-25: 10 mL

## 2023-12-25 MED ORDER — CEFAZOLIN SODIUM-DEXTROSE 2-4 GM/100ML-% IV SOLN
INTRAVENOUS | Status: AC
  Filled 2023-12-25: qty 100

## 2023-12-25 MED ORDER — SODIUM CHLORIDE 0.9% FLUSH: 3.0000 mL | INTRAVENOUS | Status: DC | PRN

## 2023-12-25 MED ORDER — ONDANSETRON HCL 4 MG/2ML IJ SOLN: 4.0000 mg | Freq: Four times a day (QID) | INTRAMUSCULAR | Status: DC | PRN

## 2023-12-25 MED ORDER — HYDROMORPHONE HCL 1 MG/ML IJ SOLN: 1.0000 mg | Freq: Once | INTRAMUSCULAR | Status: DC | PRN

## 2023-12-25 SURGICAL SUPPLY — 21 items
BALLOON JADE .014 3.0 X 40 (BALLOONS) IMPLANT
BALLOON ULTRVRSE 2.5X220X150 (BALLOONS) IMPLANT
CANNULA 5F STIFF (CANNULA) IMPLANT
CATH CXI SUPP ANG 4FR 135 (CATHETERS) IMPLANT
CATH PIG 70CM (CATHETERS) IMPLANT
CATH TEMPO 5F RIM 65CM (CATHETERS) IMPLANT
CATH VS15FR (CATHETERS) IMPLANT
COVER PROBE ULTRASOUND 5X96 (MISCELLANEOUS) IMPLANT
DEVICE PRESTO INFLATION (MISCELLANEOUS) IMPLANT
DEVICE STARCLOSE SE CLOSURE (Vascular Products) IMPLANT
GLIDEWIRE ADV .035X260CM (WIRE) IMPLANT
GUIDEWIRE PFTE-COATED .018X300 (WIRE) IMPLANT
KIT MICROPUNCTURE VSI 5F STIFF (SHEATH) IMPLANT
PACK ANGIOGRAPHY (CUSTOM PROCEDURE TRAY) ×1 IMPLANT
SHEATH BRITE TIP 5FRX11 (SHEATH) IMPLANT
SHEATH RAABE 6FRX70 (SHEATH) IMPLANT
STENT ESPRIT BTK 3.0X38 SCAFF (Permanent Stent) IMPLANT
SYR MEDRAD MARK 7 150ML (SYRINGE) IMPLANT
TUBING CONTRAST HIGH PRESS 72 (TUBING) IMPLANT
WIRE COMMAND ST ANG 014 300 (WIRE) IMPLANT
WIRE J 3MM .035X145CM (WIRE) IMPLANT

## 2023-12-25 NOTE — Assessment & Plan Note (Signed)
 Acute, reviewed ER note in detail.  Negative imaging.  Can use muscle relaxant at night as needed.  Start stretching exercises of neck.  Can apply heat on neck and ice right knee.

## 2023-12-25 NOTE — Assessment & Plan Note (Addendum)
 Acute, Reviewed ER note in detail.  Negative imaging.   Can us  diclofenac  (Volatren) gel up to four times a day on right knee.

## 2023-12-25 NOTE — Op Note (Signed)
 [facility]  Percutaneous Study/Intervention Procedural Note   Date of Surgery: 12/25/2023  Surgeon(s):Roxine Whittinghill    Assistants:none  Pre-operative Diagnosis: PAD with ulceration right lower extremity  Post-operative diagnosis:  Same  Procedure(s) Performed:             1.  Ultrasound guidance for vascular access left femoral artery             2.  Catheter placement into right SFA from left femoral approach             3.  Aortogram and selective right lower extremity angiogram             4.  Percutaneous transluminal angioplasty of right posterior tibial artery with 2.5 mm diameter angioplasty balloon             5.  Stent placement to the proximal right posterior tibial artery with 3 mm diameter by 38 mm length Esprit scaffolding system  6.  StarClose closure device left femoral artery  EBL: 10 cc  Contrast: 40 cc  Fluoro Time: 8.5 minutes  Moderate Conscious Sedation Time: approximately 51 minutes using 3 mg of Versed  and 100 mcg of Fentanyl               Indications:  Patient is a 73 y.o.male with a history of peripheral arterial disease with a previous intervention and recurring nonhealing ulcerations on the right foot. The patient has noninvasive study showing significant reduction in the digital pressures with noncompressible vessels and poor waveforms distally on the right side. The patient is brought in for angiography for further evaluation and potential treatment.  Due to the limb threatening nature of the situation, angiogram was performed for attempted limb salvage. The patient is aware that if the procedure fails, amputation would be expected.  The patient also understands that even with successful revascularization, amputation may still be required due to the severity of the situation.  Risks and benefits are discussed and informed consent is obtained.   Procedure:  The patient was identified and appropriate procedural time out was performed.   The patient was then placed supine on the table and prepped and draped in the usual sterile fashion. Moderate conscious sedation was administered during a face to face encounter with the patient throughout the procedure with my supervision of the RN administering medicines and monitoring the patient's vital signs, pulse oximetry, telemetry and mental status throughout from the start of the procedure until the patient was taken to the recovery room. Ultrasound was used to evaluate the left common femoral artery.  It was patent .  A digital ultrasound image was acquired.  A Seldinger needle was used to access the left common femoral artery under direct ultrasound guidance and a permanent image was performed.  A 0.035 J wire was advanced without resistance and a 5Fr sheath was placed.  Pigtail catheter was placed into the aorta and an AP aortogram was performed. This demonstrated normal renal arteries and normal aorta and iliac segments without significant stenosis although the aortic bifurcation was quite steep. I then crossed the aortic bifurcation and advanced to the right femoral head.  The catheter was advanced into the right SFA to opacify distally due to his very slow perfusion pressure.  Selective right lower extremity angiogram was then performed. This demonstrated fairly normal common femoral artery, profunda femoris artery, superficial femoral and popliteal arteries.  There was then a large tibioperoneal trunk with occlusion of both the peroneal and posterior  tibial arteries.  Posterior tibial artery reconstituted in the proximal to mid segment and was then continuous to the foot.  The peroneal artery did not reconstitute.  The anterior tibial artery was occluded proximally without distal reconstitution. It was felt that it was in the patient's best interest to proceed with intervention after these images to avoid a second procedure and a larger amount of contrast and fluoroscopy based off of the findings  from the initial angiogram. The patient was systemically heparinized and a 6 French 70 cm Ansell sheath was then placed over the [employer] wire. I then used a Kumpe catheter and the advantage wire to get down to the tibioperoneal trunk where I exchanged for a CXI catheter and a 0.018 advantage wire.  I was able to cross the occlusion in the posterior tibial artery proximally without difficulty and confirmed intraluminal flow in the mid posterior tibial artery.  I then exchanged for a 0.014 command wire.  I then used a 2.5 mm diameter by 22 cm length angioplasty balloon and inflated this to 12 atm in the right posterior tibial artery.  Completion imaging showed marked improvement but there was still significant residual stenosis in the proximal portion likely at the entry point of the occlusion in the posterior tibial artery.  I elected to stent this area.  A 3 mm diameter by 38 mm length Esprit scaffolding system was then deployed in the proximal right posterior tibial artery and postdilated with a 3 mm balloon.  Completion imaging showed markedly improved flow with what appeared to be less than 20% residual stenosis throughout the posterior tibial artery at this point. I elected to terminate the procedure. The sheath was removed and StarClose closure device was deployed in the left femoral artery with excellent hemostatic result. The patient was taken to the recovery room in stable condition having tolerated the procedure well.  Findings:               Aortogram:  This demonstrated normal renal arteries and normal aorta and iliac segments without significant stenosis although the aortic bifurcation was quite steep.             Right lower Extremity:  This demonstrated fairly normal common femoral artery, profunda femoris artery, superficial femoral and popliteal arteries.  There was then a large tibioperoneal trunk with occlusion of both the peroneal and posterior tibial arteries.  Posterior tibial  artery reconstituted in the proximal to mid segment and was then continuous to the foot.  The peroneal artery did not reconstitute.  The anterior tibial artery was occluded proximally without distal reconstitution   Disposition: Patient was taken to the recovery room in stable condition having tolerated the procedure well.  Complications: None  Mikki Alexander 12/25/2023 2:37 PM   This note was created with Dragon Medical transcription system. Any errors in dictation are purely unintentional.

## 2023-12-25 NOTE — Interval H&P Note (Signed)
 History and Physical Interval Note:  12/25/2023 11:52 AM  [name]  has presented today for surgery, with the diagnosis of RLE Angio   ASO w ulceration.  The various methods of treatment have been discussed with the patient and family. After consideration of risks, benefits and other options for treatment, the patient has consented to  Procedure(s): LOWER EXTREMITY INTERVENTION (Right) Lower Extremity Angiography (Right) as a surgical intervention.  [name] history has been reviewed, patient examined, no change in status, stable for surgery.  I have reviewed the patient's chart and labs.  Questions were answered to the patient's satisfaction.     Mick Tanguma

## 2023-12-26 DIAGNOSIS — M542 Cervicalgia: Secondary | ICD-10-CM | POA: Insufficient documentation

## 2023-12-26 NOTE — Assessment & Plan Note (Signed)
 Acute, again reviewed CT neck from ED visit.  Unremarkable. Most likely muscular pain.  Some possible evidence of cervical radiculopathy. No red flags. Will treat with continued muscle relaxant cyclobenzaprine  15 mg p.o. nightly as needed. Will also complete prednisone  taper to help with inflammation at nerve.  Return and ER precautions provided.

## 2023-12-26 NOTE — Assessment & Plan Note (Signed)
 Acute, again reviewed x-ray from ED.  No acute findings noted. Some concern for soft tissue injury. Will treat with prednisone  taper.  Refer to orthopedic surgery to consider steroid injection. Pain level significant, he has had to increase the frequency of his hydrocodone . New prescription written at higher frequency.  Discussed decreasing hydrocodone  over time given associated complications.

## 2023-12-27 ENCOUNTER — Other Ambulatory Visit: Payer: Self-pay | Admitting: Family

## 2023-12-27 DIAGNOSIS — M25561 Pain in right knee: Secondary | ICD-10-CM | POA: Diagnosis not present

## 2023-12-27 DIAGNOSIS — S8001XA Contusion of right knee, initial encounter: Secondary | ICD-10-CM | POA: Diagnosis not present

## 2023-12-28 ENCOUNTER — Encounter: Attending: Physician Assistant | Admitting: Physician Assistant

## 2023-12-28 DIAGNOSIS — Z7901 Long term (current) use of anticoagulants: Secondary | ICD-10-CM | POA: Insufficient documentation

## 2023-12-28 DIAGNOSIS — I2510 Atherosclerotic heart disease of native coronary artery without angina pectoris: Secondary | ICD-10-CM | POA: Diagnosis not present

## 2023-12-28 DIAGNOSIS — L97412 Non-pressure chronic ulcer of right heel and midfoot with fat layer exposed: Secondary | ICD-10-CM | POA: Diagnosis not present

## 2023-12-28 DIAGNOSIS — E11621 Type 2 diabetes mellitus with foot ulcer: Secondary | ICD-10-CM | POA: Diagnosis not present

## 2023-12-28 DIAGNOSIS — L97513 Non-pressure chronic ulcer of other part of right foot with necrosis of muscle: Secondary | ICD-10-CM | POA: Insufficient documentation

## 2023-12-28 DIAGNOSIS — I10 Essential (primary) hypertension: Secondary | ICD-10-CM | POA: Insufficient documentation

## 2023-12-28 DIAGNOSIS — I480 Paroxysmal atrial fibrillation: Secondary | ICD-10-CM | POA: Diagnosis not present

## 2023-12-28 DIAGNOSIS — L97512 Non-pressure chronic ulcer of other part of right foot with fat layer exposed: Secondary | ICD-10-CM | POA: Insufficient documentation

## 2023-12-28 DIAGNOSIS — L97515 Non-pressure chronic ulcer of other part of right foot with muscle involvement without evidence of necrosis: Secondary | ICD-10-CM | POA: Diagnosis not present

## 2023-12-29 DIAGNOSIS — E11621 Type 2 diabetes mellitus with foot ulcer: Secondary | ICD-10-CM | POA: Diagnosis not present

## 2024-01-03 ENCOUNTER — Other Ambulatory Visit: Payer: Self-pay | Admitting: Family Medicine
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
## 2024-01-03 NOTE — Telephone Encounter (Signed)
 Spoke with Mr. Benthall.  He states he is having the same issue but the prednisone  just makes him feel like getting up and doing stuff.  I advised prednisone  is not a long term medication and patient states understanding.  He states he won't go back to the orthopedist we sent him to because he a mean to him.  I did schedule him an appointment with Dr. Geralyn Knee for 01/10/24 at 2:20 pm for knee pain/possible steroid injection.  Okay to deny refill request?

## 2024-01-04 ENCOUNTER — Encounter: Admitting: Physician Assistant

## 2024-01-04 DIAGNOSIS — L97512 Non-pressure chronic ulcer of other part of right foot with fat layer exposed: Secondary | ICD-10-CM | POA: Diagnosis not present

## 2024-01-04 DIAGNOSIS — L97513 Non-pressure chronic ulcer of other part of right foot with necrosis of muscle: Secondary | ICD-10-CM | POA: Diagnosis not present

## 2024-01-04 DIAGNOSIS — E11621 Type 2 diabetes mellitus with foot ulcer: Secondary | ICD-10-CM | POA: Diagnosis not present

## 2024-01-04 DIAGNOSIS — L97411 Non-pressure chronic ulcer of right heel and midfoot limited to breakdown of skin: Secondary | ICD-10-CM | POA: Diagnosis not present

## 2024-01-07 NOTE — Progress Notes (Unsigned)
     [name] T. Arria Naim, MD, CAQ Sports Medicine [facility] [address] [place], [address]  Phone: [phone]  FAX: [phone]  [name] - 73 y.o. male  MRN [id number]  Date of Birth: 1950/09/10  Date: 01/10/2024  PCP: Judithann Novas, MD  Referral: Judithann Novas, MD  No chief complaint on file.  Subjective:   [name] is a 73 y.o. very pleasant male patient with There is no height or weight on file to calculate BMI. who presents with the following:  He is a very pleasant patient who I last saw closely 10 years ago and he was recently seen by Dr. Cherlyn Cornet for some right-sided knee pain.  He is here for evaluation.  Looks as if he actually is being treated by the wound care center for diabetic ulcers on the right foot right now.    Review of Systems is noted in the HPI, as appropriate  Objective:   There were no vitals taken for this visit.  GEN: No acute distress; alert,appropriate. PULM: Breathing comfortably in no respiratory distress PSYCH: Normally interactive.   Laboratory and Imaging Data:  Assessment and Plan:   ***

## 2024-01-08 ENCOUNTER — Other Ambulatory Visit: Payer: Self-pay | Admitting: Family Medicine

## 2024-01-08 NOTE — Telephone Encounter (Signed)
 Please deny refill.  It won't let me due to being a controlled substance.
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
## 2024-01-10 ENCOUNTER — Encounter: Payer: Self-pay | Admitting: Family Medicine

## 2024-01-10 ENCOUNTER — Ambulatory Visit: Admitting: Family Medicine

## 2024-01-10 VITALS — BP 110/60 | HR 83 | Temp 99.3°F | Ht 66.0 in | Wt 265.1 lb

## 2024-01-10 DIAGNOSIS — M1711 Unilateral primary osteoarthritis, right knee: Secondary | ICD-10-CM

## 2024-01-10 DIAGNOSIS — M25561 Pain in right knee: Secondary | ICD-10-CM

## 2024-01-10 DIAGNOSIS — M542 Cervicalgia: Secondary | ICD-10-CM

## 2024-01-10 DIAGNOSIS — H543 Unqualified visual loss, both eyes: Secondary | ICD-10-CM | POA: Diagnosis not present

## 2024-01-12 ENCOUNTER — Encounter: Admitting: Physician Assistant

## 2024-01-12 DIAGNOSIS — L97513 Non-pressure chronic ulcer of other part of right foot with necrosis of muscle: Secondary | ICD-10-CM | POA: Diagnosis not present

## 2024-01-12 DIAGNOSIS — L97512 Non-pressure chronic ulcer of other part of right foot with fat layer exposed: Secondary | ICD-10-CM | POA: Diagnosis not present

## 2024-01-12 DIAGNOSIS — E11621 Type 2 diabetes mellitus with foot ulcer: Secondary | ICD-10-CM | POA: Diagnosis not present

## 2024-01-15 ENCOUNTER — Other Ambulatory Visit: Payer: Self-pay

## 2024-01-15 ENCOUNTER — Other Ambulatory Visit: Payer: Self-pay | Admitting: Family Medicine

## 2024-01-15 NOTE — Patient Instructions (Signed)
 Visit Information  Thank you for taking time to visit with me today. Please don't hesitate to contact me if I can be of assistance to you before our next scheduled appointment.  Your next care management appointment is by telephone on 02/19/24 at 11 am  Telephone follow-up in 1 month with Michele Ahle,  RN case manager  Please call the care guide team at [phone] if you need to cancel, schedule, or reschedule an appointment.   Please call the Suicide and Crisis Lifeline: 988 call 1-800-273-TALK (toll free, 24 hour hotline) if you are experiencing a Mental Health or Behavioral Health Crisis or need someone to talk to.  Verba Girt RN, BSN, CCM [place]  [facility], Population Health Case Manager Phone: [phone]  Hypoglycemia Hypoglycemia is when the amount of sugar, or glucose, in your blood is too low. Low blood sugar can happen if you have diabetes or if you don't have diabetes. It may be an emergency. What are the causes? Low blood sugar happens most often in people who have diabetes. It may be caused by: Diabetes medicine. Not eating enough, or not eating often enough. Being more active than normal. If you don't have diabetes, you may still get low blood sugar if: There's a tumor in your pancreas. A tumor is a growth of cells that isn't normal. You don't eat enough, or you fast. Fasting is when you don't eat for long periods at a time. You have a bad infection or illness. You have problems after weight loss surgery. You have kidney or liver problems. You take certain medicines. What increases the risk? You're more likely to have low blood sugar if: You have diabetes and take medicine for it. You drink a lot of alcohol. You get sick. What are the signs or symptoms? Mild Hunger or feeling like you may vomit. Sweating and feeling cold to the touch. Feeling dizzy or light-headed. Being sleepy or having trouble sleeping. A headache. Blurry  vision. Mood changes. These include feeling worried, nervous, or easily annoyed. Moderate Feeling confused. Changes in the way you act. Weakness. An uneven heartbeat. Very bad Having very low blood sugar is an emergency. It can cause: Fainting. Seizures. A coma. Death. How is this diagnosed?  Low blood sugar can be found with a blood test. This test tells you how much sugar is in your blood. It's done while you're having symptoms. Your health care provider may also do an exam and look at your medical history. How is this treated? Treating low blood sugar If you have low blood sugar, eat or drink something with sugar in it right away. The food or drink should have 15 grams of a fast-acting carbohydrate (carb). Options include: 4 oz (120 mL) of fruit juice. 4 oz (120 mL) of soda (not diet soda). A few pieces of hard candy. Check food labels to see how many pieces to eat. 1 Tbsp (15 mL) of sugar or honey. 4 glucose tablets. 1 tube of glucose gel. Treating low blood sugar if you have diabetes Talk with your provider about how much carb you should take. If you're alert and can swallow safely, you may follow the 15:15 rule: Take 15 grams of a fast-acting carb. Check your blood sugar 15 minutes after you take the carb. If your blood sugar is still at or below 70 mg/dL (3.9 mmol/L), take 15 grams of a carb again. If your blood sugar doesn't go above 70 mg/dL (3.9 mmol/L) after 3 tries, get  help right away. After your blood sugar goes back to normal, eat a meal or a snack within 1 hour. Always keep 15 grams of a fast-acting carb with you. This could be: 4 glucose tablets. A few pieces of hard candy. 1 Tbsp (15 mL) of honey or sugar. 1 tube of glucose gel. Treating very low blood sugar If your blood sugar is less than 54 mg/dL (3 mmol/L), it's an emergency. Get help right away. If you can't eat or drink, you will need to be given glucagon. A family member or friend should learn how to  check your blood sugar and give you glucagon. Ask your provider if you should keep a glucagon kit at home. You may also need to be treated in a hospital. Follow these instructions at home: If you have diabetes: Always keep a fast-acting carb (15 grams) with you. Follow your diabetes care plan. Make sure you: Know the symptoms of low blood sugar. Check your blood sugar as often as told. Always check it before and after you exercise. Always check your blood sugar before you drive. Take your medicines as told. Eat on time. Do not skip meals. Share your diabetes care plan with: Your work or school. The people you live with. Wear an alert bracelet or carry a card that says you have diabetes. General instructions If you drink alcohol: Limit how much you have to: 0-1 drink a day if you're male. 0-2 drinks a day if you're male. Know how much alcohol is in your drink. In the [place] one drink is one 12 oz bottle of beer (355 mL), one 5 oz glass of wine (148 mL), or one 1 oz glass of hard liquor (44 mL). Be sure to eat food when you drink alcohol. Be sure to check your blood sugar after you drink. Alcohol may lead to low blood sugar later. Where to find more information American Diabetes Association (ADA): diabetes.org Contact a health care provider if: You have low blood sugar often. You have diabetes and are having trouble keeping your blood sugar in the right range. Get help right away if: You can't get your blood sugar above 70 mg/dL (3.9 mmol/L) after 3 tries. Your blood sugar is below 54 mg/dL (3 mmol/L). You have a seizure. You faint. These symptoms may be an emergency. Call 911 right away. Do not wait to see if the symptoms will go away. Do not drive yourself to the hospital. This information is not intended to replace advice given to you by your health care provider. Make sure you discuss any questions you have with your health care provider. Document Revised: 05/18/2023 Document  Reviewed: 11/02/2022 Elsevier Patient Education  2024 Elsevier Inc.  Hyperglycemia Hyperglycemia is when the amount of sugar, or glucose, in your blood is too high. High blood sugar can happen if you have diabetes or if you don't have diabetes. It may be an emergency. What are the causes? If you have diabetes, high blood sugar may be caused by: Medicines that increase blood sugar. Not giving yourself enough insulin  (if you take it). Being less active than normal. Eating more than planned. Illness, an injury, or an infection. Having surgery. Stress. If you don't have diabetes, high blood sugar may be caused by: Certain medicines, such as steroids or thiazide diuretics. Stress. A bad illness or infection. Having surgery. Diseases of the pancreas. What increases the risk? You're more likely to have high blood sugar if: Someone in your family has diabetes. You're overweight.  You aren't active. You have or have had: Prediabetes. Diabetes when pregnant. Polycystic ovarian syndrome (PCOS). What are the signs or symptoms? High blood sugar may not cause symptoms. If you do have symptoms, they may include: Feeling more thirsty than normal. Needing to pee more often than normal. Hunger. Feeling very tired. Blurry eyesight. You may have other symptoms if your high blood sugar isn't treated. These may include: Pain in your belly. A headache. Weakness. Weight loss that's not planned. A tingling or numb feeling in your hands or feet. Cuts or bruises that heal slowly. How is this diagnosed?  High blood sugar is diagnosed with a blood test. This test tells you how much sugar is in your blood. It's done while you're having symptoms.  Your health care provider may also do a physical exam, look at your medical history, and do more blood tests. These tests may include: A fasting blood glucose (FBG) test. You can't eat for at least 8 hours before this test. An A1C test. A glucose  tolerance test. How is this treated? Treatment may include: Taking medicine to control your blood sugar levels. Changing your medicine or how much you take if you take insulin  or other diabetes medicines. Checking your blood sugar more often. Making changes to your daily life. These may include: Being more active. Eating healthier foods. Losing weight. Treating an illness or infection. Stopping or taking less steroids. If your high blood sugar stays high, you may need to be treated in the hospital. Follow these instructions at home: If you have diabetes:  Know the symptoms of high blood sugar. Follow your diabetes care plan. Make sure you: Take insulin  and medicines as told. Check your blood sugar as often as told. Eat on time. Do not skip meals. Check your blood sugar before and after you exercise. If you exercise longer or harder than normal, check your blood sugar more often. Follow your sick day plan when you can't eat or drink like normal. Make this plan ahead of time with your provider. Share your diabetes care plan with: Your work or school. The people you live with. Wear an alert bracelet or carry a card that says you have diabetes. General instructions Take medicines only as told by your provider. Drink enough fluid to keep your pee (urine) pale yellow. Make sure you drink enough when you: Exercise. Get sick. Are in hot places. If you drink alcohol: Limit how much you have to: 0-1 drink a day if you're male. 0-2 drinks a day if you're male. Know how much alcohol is in your drink. In the [place] one drink is one 12 oz bottle of beer (355 mL), one 5 oz glass of wine (148 mL), or one 1 oz glass of hard liquor (44 mL). Manage stress. If you need help with this, ask your provider. Exercise as told. Try to stay at a healthy weight. Keep all follow-up visits. Your provider will want to make sure your high blood sugar is treated. Where to find more information American  Diabetes Association (ADA): diabetes.org Contact a health care provider if: You have diabetes and have trouble keeping your blood sugar in the right range. Your blood sugar is at or above 240 mg/dL (40.9 mmol/L) for 2 days in a row. You have high blood sugar often. You have signs of illness, such as: Nausea or vomiting. A headache. A fever. You can't stop vomiting. Get help right away if: Your blood sugar monitor reads "high" even when you're  taking insulin . You have trouble breathing. These symptoms may be an emergency. Call 911 right away. Do not wait to see if the symptoms will go away. Do not drive yourself to the hospital. This information is not intended to replace advice given to you by your health care provider. Make sure you discuss any questions you have with your health care provider. Document Revised: 05/18/2023 Document Reviewed: 11/02/2022 Elsevier Patient Education  2024 [employer].
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
## 2024-01-15 NOTE — Telephone Encounter (Signed)
 Last office visit 01/10/2024 with Dr. Geralyn Knee for knee pain.  Last refilled 12/19/2023 for #90 with no refills.  Next Appt: 02/07/2024 with Dr. Geralyn Knee.

## 2024-01-16 ENCOUNTER — Encounter (INDEPENDENT_AMBULATORY_CARE_PROVIDER_SITE_OTHER): Payer: Self-pay

## 2024-01-16 DIAGNOSIS — H35372 Puckering of macula, left eye: Secondary | ICD-10-CM | POA: Diagnosis not present

## 2024-01-16 DIAGNOSIS — E113393 Type 2 diabetes mellitus with moderate nonproliferative diabetic retinopathy without macular edema, bilateral: Secondary | ICD-10-CM | POA: Diagnosis not present

## 2024-01-16 DIAGNOSIS — H35033 Hypertensive retinopathy, bilateral: Secondary | ICD-10-CM | POA: Diagnosis not present

## 2024-01-16 DIAGNOSIS — H3582 Retinal ischemia: Secondary | ICD-10-CM | POA: Diagnosis not present

## 2024-01-16 DIAGNOSIS — H3563 Retinal hemorrhage, bilateral: Secondary | ICD-10-CM | POA: Diagnosis not present

## 2024-01-16 DIAGNOSIS — H34813 Central retinal vein occlusion, bilateral, with macular edema: Secondary | ICD-10-CM | POA: Diagnosis not present

## 2024-01-17 ENCOUNTER — Other Ambulatory Visit (INDEPENDENT_AMBULATORY_CARE_PROVIDER_SITE_OTHER): Payer: Self-pay | Admitting: Vascular Surgery

## 2024-01-17 DIAGNOSIS — I739 Peripheral vascular disease, unspecified: Secondary | ICD-10-CM

## 2024-01-18 ENCOUNTER — Encounter: Admitting: Physician Assistant

## 2024-01-18 DIAGNOSIS — L97513 Non-pressure chronic ulcer of other part of right foot with necrosis of muscle: Secondary | ICD-10-CM | POA: Diagnosis not present

## 2024-01-18 DIAGNOSIS — L97512 Non-pressure chronic ulcer of other part of right foot with fat layer exposed: Secondary | ICD-10-CM | POA: Diagnosis not present

## 2024-01-18 DIAGNOSIS — E11621 Type 2 diabetes mellitus with foot ulcer: Secondary | ICD-10-CM | POA: Diagnosis not present

## 2024-01-19 ENCOUNTER — Ambulatory Visit (INDEPENDENT_AMBULATORY_CARE_PROVIDER_SITE_OTHER)

## 2024-01-19 ENCOUNTER — Ambulatory Visit (INDEPENDENT_AMBULATORY_CARE_PROVIDER_SITE_OTHER): Admitting: Vascular Surgery

## 2024-01-19 ENCOUNTER — Encounter (INDEPENDENT_AMBULATORY_CARE_PROVIDER_SITE_OTHER): Payer: Self-pay | Admitting: Vascular Surgery

## 2024-01-19 VITALS — BP 134/77 | HR 87 | Resp 16 | Ht 66.0 in | Wt 264.2 lb

## 2024-01-19 DIAGNOSIS — I739 Peripheral vascular disease, unspecified: Secondary | ICD-10-CM | POA: Diagnosis not present

## 2024-01-19 DIAGNOSIS — E11319 Type 2 diabetes mellitus with unspecified diabetic retinopathy without macular edema: Secondary | ICD-10-CM

## 2024-01-19 DIAGNOSIS — Z9889 Other specified postprocedural states: Secondary | ICD-10-CM

## 2024-01-19 DIAGNOSIS — Z794 Long term (current) use of insulin: Secondary | ICD-10-CM | POA: Diagnosis not present

## 2024-01-19 DIAGNOSIS — I152 Hypertension secondary to endocrine disorders: Secondary | ICD-10-CM | POA: Diagnosis not present

## 2024-01-19 DIAGNOSIS — I70219 Atherosclerosis of native arteries of extremities with intermittent claudication, unspecified extremity: Secondary | ICD-10-CM | POA: Diagnosis not present

## 2024-01-19 DIAGNOSIS — E1159 Type 2 diabetes mellitus with other circulatory complications: Secondary | ICD-10-CM

## 2024-01-19 NOTE — Progress Notes (Signed)
 Subjective:    Patient ID: [name], male    DOB: 1950/12/14, 73 y.o.   MRN: [id number] Chief Complaint  Patient presents with   Follow-up    [facility] 4 week with ABI    [name] is a 73 yo male who presents to clinic today for 4-week follow-up from right lower extremity angiogram with right posttibial artery stent placement on 12/25/2023.  Patient has no complaints concerning his right leg.  States that swelling is gone down and he feels much better today.  States his leg is getting stronger by the day.  However he is concerned about his left leg.  He states he is having pain at rest and cannot walk more than 50 feet without having to stop and sit down.  He states every once a while when he has been sitting for a while he gets numbness and tingling and some pain to his left foot.  Patient had ABIs completed on 12/01/2023 and they are essentially unchanged today from that study but the patient continues to have claudication symptoms.    Review of Systems  Constitutional: Negative.   HENT: Negative.    Respiratory: Negative.    Cardiovascular: Negative.   Gastrointestinal: Negative.   Genitourinary: Negative.   Musculoskeletal:  Positive for gait problem.  Neurological:  Positive for weakness and numbness.       Numbness and leaking causing gait issues due to his left leg claudication.  Psychiatric/Behavioral: Negative.    All other systems reviewed and are negative.      Objective:    Physical Exam Constitutional:      Appearance: Normal appearance. He is obese.  HENT:     Head: Normocephalic.  Eyes:     Pupils: Pupils are equal, round, and reactive to light.  Cardiovascular:     Rate and Rhythm: Normal rate and regular rhythm.     Pulses: Normal pulses.     Heart sounds: Normal heart sounds.  Pulmonary:     Effort: Pulmonary effort is normal.     Breath sounds: Normal breath sounds.  Abdominal:     General: Abdomen is flat. Bowel sounds are normal.     Palpations:  Abdomen is soft.  Musculoskeletal:        General: Tenderness present.     Comments: Left lower extremity with numbness tingling and pain due to claudication.  Skin:    General: Skin is warm and dry.     Capillary Refill: Capillary refill takes more than 3 seconds.  Neurological:     General: No focal deficit present.     Mental Status: He is alert and oriented to person, place, and time. Mental status is at baseline.  Psychiatric:        Mood and Affect: Mood normal.        Behavior: Behavior normal.        Thought Content: Thought content normal.        Judgment: Judgment normal.     BP 134/77   Pulse 87   Resp 16   Ht 5\' 6"  (1.676 m)   Wt 264 lb 3.2 oz (119.8 kg)   BMI 42.64 kg/m   Past Medical History:  Diagnosis Date   Back injury 02/2002   worker's comp   CHF (congestive heart failure) (HCC)    Coronary artery disease, non-occlusive    a. cath 2002 with no sig CAD;  b. cath 2008 normal LM, LAD, LCx, p&dRCA 20-30%, PDA 30%;  c.11/2015 NSTEMI/PCI: LM nl, LAD 95p (2.5x15 Xience DES), LCX nl, RCA 100p/m w/ L->R collats, EF 55-65% c. NSTEMI (02/2016) with no culprit leision, switched to Brilinta .  d. NSTEMI 03/2016: again, no culprit lesion and switched back to plavix  2/2 SOB with Brilnta.     Depression    Diabetes mellitus type 2, insulin  dependent (HCC)    Hyperlipemia    Hypertension    Hypertensive heart disease    Kidney stones    Macular degeneration    Morbid obesity (HCC)    Osteoarthritis    Snoring     Social History   Socioeconomic History   Marital status: Widowed    Spouse name: Not on file   Number of children: 3   Years of education: Not on file   Highest education level: 7th grade  Occupational History   Occupation: disabled    Associate Professor: UNEMPLOYED    Comment: back injury  Tobacco Use   Smoking status: Never   Smokeless tobacco: Never  Vaping Use   Vaping status: Never Used  Substance and Sexual Activity   Alcohol use: No    Alcohol/week:  0.0 standard drinks of alcohol   Drug use: No   Sexual activity: Not Currently  Other Topics Concern   Not on file  Social History Narrative   Has a roommate, [name]. No pets.   Social Drivers of Corporate investment banker Strain: Low Risk  (11/03/2023)   Overall Financial Resource Strain (CARDIA)    Difficulty of Paying Living Expenses: Not hard at all  Food Insecurity: No Food Insecurity (01/15/2024)   Hunger Vital Sign    Worried About Running Out of Food in the Last Year: Never true    Ran Out of Food in the Last Year: Never true  Transportation Needs: No Transportation Needs (01/15/2024)   PRAPARE - Administrator, Civil Service (Medical): No    Lack of Transportation (Non-Medical): No  Physical Activity: Inactive (11/03/2023)   Exercise Vital Sign    Days of Exercise per Week: 0 days    Minutes of Exercise per Session: 0 min  Stress: Stress Concern Present (11/03/2023)   [employer] of Occupational Health - Occupational Stress Questionnaire    Feeling of Stress : Rather much  Social Connections: Socially Isolated (11/03/2023)   Social Connection and Isolation Panel [NHANES]    Frequency of Communication with Friends and Family: More than three times a week    Frequency of Social Gatherings with Friends and Family: Twice a week    Attends Religious Services: Never    Database administrator or Organizations: No    Attends Banker Meetings: Never    Marital Status: Widowed  Intimate Partner Violence: Not At Risk (01/15/2024)   Humiliation, Afraid, Rape, and Kick questionnaire    Fear of Current or Ex-Partner: No    Emotionally Abused: No    Physically Abused: No    Sexually Abused: No    Past Surgical History:  Procedure Laterality Date   CARDIAC CATHETERIZATION  09/29/2000   diffuse LAD 30% LCA  EF 50-60%   CARDIAC CATHETERIZATION  06/30/2007   no significant CAD   CARDIAC CATHETERIZATION N/A 12/25/2015   Procedure: Left Heart Cath and  Coronary Angiography;  Surgeon: Devorah Fonder, MD;  Location: [facility] INVASIVE CV LAB;  Service: Cardiovascular;  Laterality: N/A;   CARDIAC CATHETERIZATION N/A 12/25/2015   Procedure: Coronary Stent Intervention;  Surgeon: Antonette Batters, MD;  Location: [facility];  Service: Cardiovascular;  Laterality: N/A;   CARDIAC CATHETERIZATION N/A 03/10/2016   Procedure: Left Heart Cath and Coronary Angiography;  Surgeon: Wenona Hamilton, MD;  Location: [facility];  Service: Cardiovascular;  Laterality: N/A;   CARDIAC CATHETERIZATION N/A 04/06/2016   Procedure: Left Heart Cath and Coronary Angiography;  Surgeon: Arty Binning, MD;  Location: [facility];  Service: Cardiovascular;  Laterality: N/A;   CARDIOVERSION N/A 03/03/2023   Procedure: CARDIOVERSION;  Surgeon: Devorah Fonder, MD;  Location: [facility] ORS;  Service: Cardiovascular;  Laterality: N/A;   CIRCUMCISION     CORONARY ANGIOPLASTY     LOWER EXTREMITY ANGIOGRAPHY Right 03/27/2023   Procedure: Lower Extremity Angiography;  Surgeon: Celso College, MD;  Location: [facility] INVASIVE CV [facility];  Service: Cardiovascular;  Laterality: Right;   LOWER EXTREMITY ANGIOGRAPHY Right 12/25/2023   Procedure: Lower Extremity Angiography;  Surgeon: Celso College, MD;  Location: [facility] INV[facility];  Service: Cardiovascular;  Laterality: Right;   LOWER EXTREMITY INTERVENTION Right 12/25/2023   Procedure: LOWER EXTREMITY INTERVENTION;  Surgeon: Celso College, MD;  Location: [facility] INV[facility]IVE CV [facility];  Service: Cardiovascular;  Laterality: Right;    Family History  Problem Relation Age of Onset   Alzheimer's disease Mother    Emphysema Mother    Diabetes Father    Heart disease Father        MI   Cancer Brother        ? Neck cancer    Allergies  Allergen Reactions   Bupropion  Nausea Only   Ozempic  (0.25 Or 0.5 Mg-Dose) [Semaglutide (0.25 Or 0.5mg -Dos)] Nausea And Vomiting   Atorvastatin Other (See Comments)    Body aches Similar effect  with rosuvastatin  40 mg twice weekly       Latest Ref Rng & Units 11/16/2023   12:12 PM 05/09/2023    4:49 PM 02/28/2023   12:31 PM  CBC  WBC 4.0 - 10.5 K/uL 12.8  9.6  8.2   Hemoglobin 13.0 - 17.0 g/dL 45.4  09.8  11.9   Hematocrit 39.0 - 52.0 % 41.7  42.4  40.6   Platelets 150 - 400 K/uL 235  256  248        CMP     Component Value Date/Time   NA 142 11/30/2023 0836   K 4.4 11/30/2023 0836   CL 103 11/30/2023 0836   CO2 33 (H) 11/30/2023 0836   GLUCOSE 138 (H) 11/30/2023 0836   BUN 27 (H) 12/25/2023 1210   CREATININE 1.05 12/25/2023 1210   CALCIUM  9.1 11/30/2023 0836   PROT 6.6 11/30/2023 0836   ALBUMIN 3.7 11/30/2023 0836   AST 13 11/30/2023 0836   ALT 17 11/30/2023 0836   ALKPHOS 79 11/30/2023 0836   BILITOT 0.4 11/30/2023 0836   GFR 72.63 11/30/2023 0836   GFRNONAA >60 12/25/2023 1210     VAS US  ABI WITH/WO TBI Result Date: 12/04/2023  LOWER EXTREMITY DOPPLER STUDY Patient Name:  JEWELZ KOBUS  Date of Exam:   12/01/2023 Medical Rec #: [id number]      Accession #:    [id number] Date of Birth: [birth year]      Patient Gender: M Patient Age:   71 years Exam Location:  [facility] Procedure:      VAS US  ABI WITH/WO TBI Referring Phys: HOYT STONE III --------------------------------------------------------------------------------  Indications: Ulceration, and peripheral artery disease. rt foot  ulcers/non-healing wounds High Risk Factors: Hypertension, prior MI, coronary artery disease.  Vascular Interventions: 03/27/2023 Right popliteal and posterior tibial PTA. Comparison Study: 03/2023 Performing Technologist: Tonie Franks RVS  Examination Guidelines: A complete evaluation includes at minimum, Doppler waveform signals and systolic blood pressure reading at the level of bilateral brachial, anterior tibial, and posterior tibial arteries, when vessel segments are accessible. Bilateral testing is considered an integral part of a complete examination.  Photoelectric Plethysmograph (PPG) waveforms and toe systolic pressure readings are included as required and additional duplex testing as needed. Limited examinations for reoccurring indications may be performed as noted.  ABI Findings: +---------+------------------+-----+----------+---------+ Right    Rt Pressure (mmHg)IndexWaveform  Comment   +---------+------------------+-----+----------+---------+ Brachial 127                                        +---------+------------------+-----+----------+---------+ PTA      179               1.41 biphasic  hyperemic +---------+------------------+-----+----------+---------+ PERO     67                0.53 monophasic          +---------+------------------+-----+----------+---------+ DP       57                0.45 monophasic          +---------+------------------+-----+----------+---------+ Great Toe54                0.43 Abnormal            +---------+------------------+-----+----------+---------+ +---------+------------------+-----+----------+-------+ Left     Lt Pressure (mmHg)IndexWaveform  Comment +---------+------------------+-----+----------+-------+ Brachial 125                                      +---------+------------------+-----+----------+-------+ PTA      83                0.65 monophasic        +---------+------------------+-----+----------+-------+ DP       98                0.77 biphasic          +---------+------------------+-----+----------+-------+ Great Toe64                0.50 Abnormal          +---------+------------------+-----+----------+-------+ +-------+-----------+-----------+------------+------------+ ABI/TBIToday's ABIToday's TBIPrevious ABIPrevious TBI +-------+-----------+-----------+------------+------------+ Right  1.41       .43        2.26        .59          +-------+-----------+-----------+------------+------------+ Left   .77        .50        .80          .51          +-------+-----------+-----------+------------+------------+ Right TBIs appear decreased compared to prior study on 04/2023. Left ABIs and TBIs appear essentially unchanged compared to prior study on 04/2023.  Summary: Right: Resting right ankle-brachial index indicates noncompressible right lower extremity arteries. The right toe-brachial index is abnormal. Imaging performed of the Popliteal Artery, and Tibial Arteries. Left: Resting left ankle-brachial index indicates moderate left lower extremity arterial disease. The left toe-brachial index is abnormal. Imaging performed of the Popliteal Artery, and Tibial Arteries.  *See table(s) above  for measurements and observations.  Suggest Peripheral Vascular Consult. Electronically signed by Mikki Alexander MD on 12/04/2023 at 3:36:29 PM.    Final        Assessment & Plan:   1. Atherosclerosis with claudication of extremity (HCC) (Primary) Patient underwent bilateral lower extremity ABIs.    Right ABI today is 1.21.  Previous right ABI was 1.41.  Left ABI today is 0.77 previous ABI was 0.77, no change in left ABI from previous study.   Patient now with increasing claudication symptoms to his left leg and his inability to walk more than 50 feet today. He states he was told in a prior visit he will need to have and angiogram to his left lower extremity. He would like to get that scheduled today. I agree with the need for a left lower extremity angiogram today due to the patient's exam and physical symptoms.  We will schedule accordingly with Dr. Mikki Alexander MD as he completed the right lower extremity angiogram on the patient back in April.  2. Hypertension associated with diabetes (HCC) Continue antihypertensive medications as already ordered, these medications have been reviewed and there are no changes at this time.  3. Type 2 diabetes mellitus with retinopathy of both eyes, with long-term current use of insulin , macular edema presence unspecified,  unspecified retinopathy severity (HCC) Continue hypoglycemic medications as already ordered, these medications have been reviewed and there are no changes at this time.  Hgb A1C to be monitored as already arranged by primary service   Current Outpatient Medications on File Prior to Visit  Medication Sig Dispense Refill   Acetaminophen  (TYLENOL  PO) Take 3-4 tablets by mouth as needed (pain).     apixaban  (ELIQUIS ) 5 MG TABS tablet Take 1 tablet (5 mg total) by mouth 2 (two) times daily. 60 tablet 5   carvedilol  (COREG ) 6.25 MG tablet TAKE ONE TABLET (6.25 MG TOTAL) BY MOUTH TWO (TWO) TIMES DAILY WITH A MEAL. 60 tablet 5   Cholecalciferol  (D-3-5) 125 MCG (5000 UT) capsule Take 5,000 Units by mouth daily.     clopidogrel  (PLAVIX ) 75 MG tablet TAKE ONE TABLET BY MOUTH ONCE DAILY WITH BREAKFAST 90 tablet 3   Continuous Glucose Receiver (FREESTYLE LIBRE 2 READER) DEVI USE WITH SENSORS TO MONITOR SUGAR CONTINUOUSLY 1 each 0   Continuous Glucose Sensor (FREESTYLE LIBRE 2 SENSOR) MISC APPLY SENSOR EVERY 14 DAYS TO MONITOR SUGAR CONTINOUSLY 2 each 11   cyclobenzaprine  (FLEXERIL ) 10 MG tablet Take 0.5-1 tablets (5-10 mg total) by mouth at bedtime as needed for muscle spasms. 15 tablet 0   Dulaglutide  (TRULICITY ) 4.5 MG/0.5ML SOAJ INJECT 4.5MG  (0.5ML) UNDER THE SKIN ONCE A WEEK 6 mL 3   empagliflozin  (JARDIANCE ) 25 MG TABS tablet Take 1 tablet (25 mg total) by mouth daily before breakfast. 30 tablet 11   ENTRESTO  24-26 MG TAKE ONE TABLET BY MOUTH TWO TIMES DAILY. 60 tablet 3   ezetimibe  (ZETIA ) 10 MG tablet Take 1 tablet (10 mg total) by mouth daily. 90 tablet 3   furosemide  (LASIX ) 40 MG tablet Take 1 tablet (40 mg total) by mouth daily. 90 tablet 1   gabapentin  (NEURONTIN ) 100 MG capsule TAKE ONE CAPSULE (100 MG TOTAL) BY MOUTH AT BEDTIME. 30 capsule 3   HYDROcodone -acetaminophen  (NORCO) 10-325 MG tablet Take 0.5-1 tablets by mouth every 8 (eight) hours as needed. 90 tablet 0   insulin  aspart (NOVOLOG   FLEXPEN) 100 UNIT/ML FlexPen INJECT 13 UNITS UNDER THE SKIN EVERY MORNING AND THREE UNITS AS NEEDED  IN THE EVENING. 15 mL 2   isosorbide  mononitrate (IMDUR ) 30 MG 24 hr tablet TAKE ONE TABLET BY MOUTH TWICE DAILY 180 tablet 1   nitroGLYCERIN  (NITROSTAT ) 0.4 MG SL tablet DISSOLVE 1 TABLET UNDER TONGUE AS NEEDEDFOR CHEST PAIN. MAY REPEAT 5 MINUTES APART 3 TIMES IF NEEDED 25 tablet 3   spironolactone  (ALDACTONE ) 25 MG tablet TAKE ONE TABLET (25 MG TOTAL) BY MOUTH DAILY. 30 tablet 5   TRESIBA  FLEXTOUCH 100 UNIT/ML FlexTouch Pen INJECT 50 UNITS INTO THE SKIN DAILY. 15 mL 2   TRUEPLUS 5-BEVEL PEN NEEDLES 31G X 6 MM MISC USE TO INJECT INSULIN  THREE TIMES A DAY 300 each 3   venlafaxine  XR (EFFEXOR -XR) 150 MG 24 hr capsule TAKE ONE CAPSULE BY MOUTH DAILY WITH BREAKFAST. TAKE WITH EFFEXOR  XR 75MG  FOR A TOTAL OF 225MG  90 capsule 1   venlafaxine  XR (EFFEXOR -XR) 75 MG 24 hr capsule TAKE ONE CAPSULE BY MOUTH ONCE DAILY. TAKE IN ADDITION TO THE 150 MG CAPSULE FOR A TOTAL DOSE OF 225MG  DAILY 90 capsule 1   vitamin B-12 (CYANOCOBALAMIN ) 1000 MCG tablet Take 1,000 mcg by mouth daily.     No current facility-administered medications on file prior to visit.    There are no Patient Instructions on file for this visit. No follow-ups on file.   Annamaria Barrette, NP

## 2024-01-19 NOTE — H&P (View-Only) (Signed)
 Subjective:    Patient ID: [name], male    DOB: 1950/12/14, 73 y.o.   MRN: [id number] Chief Complaint  Patient presents with   Follow-up    [facility] 4 week with ABI    [name] is a 73 yo male who presents to clinic today for 4-week follow-up from right lower extremity angiogram with right posttibial artery stent placement on 12/25/2023.  Patient has no complaints concerning his right leg.  States that swelling is gone down and he feels much better today.  States his leg is getting stronger by the day.  However he is concerned about his left leg.  He states he is having pain at rest and cannot walk more than 50 feet without having to stop and sit down.  He states every once a while when he has been sitting for a while he gets numbness and tingling and some pain to his left foot.  Patient had ABIs completed on 12/01/2023 and they are essentially unchanged today from that study but the patient continues to have claudication symptoms.    Review of Systems  Constitutional: Negative.   HENT: Negative.    Respiratory: Negative.    Cardiovascular: Negative.   Gastrointestinal: Negative.   Genitourinary: Negative.   Musculoskeletal:  Positive for gait problem.  Neurological:  Positive for weakness and numbness.       Numbness and leaking causing gait issues due to his left leg claudication.  Psychiatric/Behavioral: Negative.    All other systems reviewed and are negative.      Objective:    Physical Exam Constitutional:      Appearance: Normal appearance. He is obese.  HENT:     Head: Normocephalic.  Eyes:     Pupils: Pupils are equal, round, and reactive to light.  Cardiovascular:     Rate and Rhythm: Normal rate and regular rhythm.     Pulses: Normal pulses.     Heart sounds: Normal heart sounds.  Pulmonary:     Effort: Pulmonary effort is normal.     Breath sounds: Normal breath sounds.  Abdominal:     General: Abdomen is flat. Bowel sounds are normal.     Palpations:  Abdomen is soft.  Musculoskeletal:        General: Tenderness present.     Comments: Left lower extremity with numbness tingling and pain due to claudication.  Skin:    General: Skin is warm and dry.     Capillary Refill: Capillary refill takes more than 3 seconds.  Neurological:     General: No focal deficit present.     Mental Status: He is alert and oriented to person, place, and time. Mental status is at baseline.  Psychiatric:        Mood and Affect: Mood normal.        Behavior: Behavior normal.        Thought Content: Thought content normal.        Judgment: Judgment normal.     BP 134/77   Pulse 87   Resp 16   Ht 5\' 6"  (1.676 m)   Wt 264 lb 3.2 oz (119.8 kg)   BMI 42.64 kg/m   Past Medical History:  Diagnosis Date   Back injury 02/2002   worker's comp   CHF (congestive heart failure) (HCC)    Coronary artery disease, non-occlusive    a. cath 2002 with no sig CAD;  b. cath 2008 normal LM, LAD, LCx, p&dRCA 20-30%, PDA 30%;  c.11/2015 NSTEMI/PCI: LM nl, LAD 95p (2.5x15 Xience DES), LCX nl, RCA 100p/m w/ L->R collats, EF 55-65% c. NSTEMI (02/2016) with no culprit leision, switched to Brilinta .  d. NSTEMI 03/2016: again, no culprit lesion and switched back to plavix  2/2 SOB with Brilnta.     Depression    Diabetes mellitus type 2, insulin  dependent (HCC)    Hyperlipemia    Hypertension    Hypertensive heart disease    Kidney stones    Macular degeneration    Morbid obesity (HCC)    Osteoarthritis    Snoring     Social History   Socioeconomic History   Marital status: Widowed    Spouse name: Not on file   Number of children: 3   Years of education: Not on file   Highest education level: 7th grade  Occupational History   Occupation: disabled    Associate Professor: UNEMPLOYED    Comment: back injury  Tobacco Use   Smoking status: Never   Smokeless tobacco: Never  Vaping Use   Vaping status: Never Used  Substance and Sexual Activity   Alcohol use: No    Alcohol/week:  0.0 standard drinks of alcohol   Drug use: No   Sexual activity: Not Currently  Other Topics Concern   Not on file  Social History Narrative   Has a roommate, Mr. Margretta Shi. No pets.   Social Drivers of Corporate investment banker Strain: Low Risk  (11/03/2023)   Overall Financial Resource Strain (CARDIA)    Difficulty of Paying Living Expenses: Not hard at all  Food Insecurity: No Food Insecurity (01/15/2024)   Hunger Vital Sign    Worried About Running Out of Food in the Last Year: Never true    Ran Out of Food in the Last Year: Never true  Transportation Needs: No Transportation Needs (01/15/2024)   PRAPARE - Administrator, Civil Service (Medical): No    Lack of Transportation (Non-Medical): No  Physical Activity: Inactive (11/03/2023)   Exercise Vital Sign    Days of Exercise per Week: 0 days    Minutes of Exercise per Session: 0 min  Stress: Stress Concern Present (11/03/2023)   [employer] of Occupational Health - Occupational Stress Questionnaire    Feeling of Stress : Rather much  Social Connections: Socially Isolated (11/03/2023)   Social Connection and Isolation Panel [NHANES]    Frequency of Communication with Friends and Family: More than three times a week    Frequency of Social Gatherings with Friends and Family: Twice a week    Attends Religious Services: Never    Database administrator or Organizations: No    Attends Banker Meetings: Never    Marital Status: Widowed  Intimate Partner Violence: Not At Risk (01/15/2024)   Humiliation, Afraid, Rape, and Kick questionnaire    Fear of Current or Ex-Partner: No    Emotionally Abused: No    Physically Abused: No    Sexually Abused: No    Past Surgical History:  Procedure Laterality Date   CARDIAC CATHETERIZATION  09/29/2000   diffuse LAD 30% LCA  EF 50-60%   CARDIAC CATHETERIZATION  06/30/2007   no significant CAD   CARDIAC CATHETERIZATION N/A 12/25/2015   Procedure: Left Heart Cath and  Coronary Angiography;  Surgeon: Devorah Fonder, MD;  Location: [facility] INVASIVE CV LAB;  Service: Cardiovascular;  Laterality: N/A;   CARDIAC CATHETERIZATION N/A 12/25/2015   Procedure: Coronary Stent Intervention;  Surgeon: Antonette Batters, MD;  Location: [facility] INVASIVE CV [facility];  Service: Cardiovascular;  Laterality: N/A;   CARDIAC CATHETERIZATION N/A 03/10/2016   Procedure: Left Heart Cath and Coronary Angiography;  Surgeon: Wenona Hamilton, MD;  Location: [facility] INVASIVE CV [facility];  Service: Cardiovascular;  Laterality: N/A;   CARDIAC CATHETERIZATION N/A 04/06/2016   Procedure: Left Heart Cath and Coronary Angiography;  Surgeon: Arty Binning, MD;  [facility]: [facility] INVASIVE CV [facility];  Service: Cardiovascular;  Laterality: N/A;   CARDIOVERSION N/A 03/03/2023   Procedure: CARDIOVERSION;  Surgeon: Devorah Fonder, MD;  Location: [facility] ORS;  Service: Cardiovascular;  Laterality: N/A;   CIRCUMCISION     CORONARY ANGIOPLASTY     LOWER EXTREMITY ANGIOGRAPHY Right 03/27/2023   Procedure: Lower Extremity Angiography;  Surgeon: Celso College, MD;  Location: [facility] INVASIVE CV LAB;  Service: Cardiovascular;  Laterality: Right;   LOWER EXTREMITY ANGIOGRAPHY Right 12/25/2023   Procedure: Lower Extremity Angiography;  Surgeon: Celso College, MD;  Location: [facility] INVASIVE CV LAB;  Service: Cardiovascular;  Laterality: Right;   LOWER EXTREMITY INTERVENTION Right 12/25/2023   Procedure: LOWER EXTREMITY INTERVENTION;  Surgeon: Celso College, MD;  Location: [facility] INVASIVE CV LAB;  Service: Cardiovascular;  Laterality: Right;    Family History  Problem Relation Age of Onset   Alzheimer's disease Mother    Emphysema Mother    Diabetes Father    Heart disease Father        MI   Cancer Brother        ? Neck cancer    Allergies  Allergen Reactions   Bupropion  Nausea Only   Ozempic  (0.25 Or 0.5 Mg-Dose) [Semaglutide (0.25 Or 0.5mg -Dos)] Nausea And Vomiting   Atorvastatin Other (See Comments)    Body aches Similar effect  with rosuvastatin  40 mg twice weekly       Latest Ref Rng & Units 11/16/2023   12:12 PM 05/09/2023    4:49 PM 02/28/2023   12:31 PM  CBC  WBC 4.0 - 10.5 K/uL 12.8  9.6  8.2   Hemoglobin 13.0 - 17.0 g/dL 45.4  09.8  11.9   Hematocrit 39.0 - 52.0 % 41.7  42.4  40.6   Platelets 150 - 400 K/uL 235  256  248        CMP     Component Value Date/Time   NA 142 11/30/2023 0836   K 4.4 11/30/2023 0836   CL 103 11/30/2023 0836   CO2 33 (H) 11/30/2023 0836   GLUCOSE 138 (H) 11/30/2023 0836   BUN 27 (H) 12/25/2023 1210   CREATININE 1.05 12/25/2023 1210   CALCIUM  9.1 11/30/2023 0836   PROT 6.6 11/30/2023 0836   ALBUMIN 3.7 11/30/2023 0836   AST 13 11/30/2023 0836   ALT 17 11/30/2023 0836   ALKPHOS 79 11/30/2023 0836   BILITOT 0.4 11/30/2023 0836   GFR 72.63 11/30/2023 0836   GFRNONAA >60 12/25/2023 1210     VAS US  ABI WITH/WO TBI Result Date: 12/04/2023  LOWER EXTREMITY DOPPLER STUDY Patient Name:  JEWELZ KOBUS  Date of Exam:   12/01/2023 Medical Rec #: [id number]      Accession #:    [id number] Date of Birth: [birth year]      Patient Gender: M Patient Age:   71 years Exam Location:  [facility] Procedure:      VAS US  ABI WITH/WO TBI Referring Phys: HOYT STONE III --------------------------------------------------------------------------------  Indications: Ulceration, and peripheral artery disease. rt foot  ulcers/non-healing wounds High Risk Factors: Hypertension, prior MI, coronary artery disease.  Vascular Interventions: 03/27/2023 Right popliteal and posterior tibial PTA. Comparison Study: 03/2023 Performing Technologist: Tonie Franks RVS  Examination Guidelines: A complete evaluation includes at minimum, Doppler waveform signals and systolic blood pressure reading at the level of bilateral brachial, anterior tibial, and posterior tibial arteries, when vessel segments are accessible. Bilateral testing is considered an integral part of a complete examination.  Photoelectric Plethysmograph (PPG) waveforms and toe systolic pressure readings are included as required and additional duplex testing as needed. Limited examinations for reoccurring indications may be performed as noted.  ABI Findings: +---------+------------------+-----+----------+---------+ Right    Rt Pressure (mmHg)IndexWaveform  Comment   +---------+------------------+-----+----------+---------+ Brachial 127                                        +---------+------------------+-----+----------+---------+ PTA      179               1.41 biphasic  hyperemic +---------+------------------+-----+----------+---------+ PERO     67                0.53 monophasic          +---------+------------------+-----+----------+---------+ DP       57                0.45 monophasic          +---------+------------------+-----+----------+---------+ Great Toe54                0.43 Abnormal            +---------+------------------+-----+----------+---------+ +---------+------------------+-----+----------+-------+ Left     Lt Pressure (mmHg)IndexWaveform  Comment +---------+------------------+-----+----------+-------+ Brachial 125                                      +---------+------------------+-----+----------+-------+ PTA      83                0.65 monophasic        +---------+------------------+-----+----------+-------+ DP       98                0.77 biphasic          +---------+------------------+-----+----------+-------+ Great Toe64                0.50 Abnormal          +---------+------------------+-----+----------+-------+ +-------+-----------+-----------+------------+------------+ ABI/TBIToday's ABIToday's TBIPrevious ABIPrevious TBI +-------+-----------+-----------+------------+------------+ Right  1.41       .43        2.26        .59          +-------+-----------+-----------+------------+------------+ Left   .77        .50        .80          .51          +-------+-----------+-----------+------------+------------+ Right TBIs appear decreased compared to prior study on 04/2023. Left ABIs and TBIs appear essentially unchanged compared to prior study on 04/2023.  Summary: Right: Resting right ankle-brachial index indicates noncompressible right lower extremity arteries. The right toe-brachial index is abnormal. Imaging performed of the Popliteal Artery, and Tibial Arteries. Left: Resting left ankle-brachial index indicates moderate left lower extremity arterial disease. The left toe-brachial index is abnormal. Imaging performed of the Popliteal Artery, and Tibial Arteries.  *See table(s) above  for measurements and observations.  Suggest Peripheral Vascular Consult. Electronically signed by Mikki Alexander MD on 12/04/2023 at 3:36:29 PM.    Final        Assessment & Plan:   1. Atherosclerosis with claudication of extremity (HCC) (Primary) Patient underwent bilateral lower extremity ABIs.    Right ABI today is 1.21.  Previous right ABI was 1.41.  Left ABI today is 0.77 previous ABI was 0.77, no change in left ABI from previous study.   Patient now with increasing claudication symptoms to his left leg and his inability to walk more than 50 feet today. He states he was told in a prior visit he will need to have and angiogram to his left lower extremity. He would like to get that scheduled today. I agree with the need for a left lower extremity angiogram today due to the patient's exam and physical symptoms.  We will schedule accordingly with Dr. Mikki Alexander MD as he completed the right lower extremity angiogram on the patient back in April.  2. Hypertension associated with diabetes (HCC) Continue antihypertensive medications as already ordered, these medications have been reviewed and there are no changes at this time.  3. Type 2 diabetes mellitus with retinopathy of both eyes, with long-term current use of insulin , macular edema presence unspecified,  unspecified retinopathy severity (HCC) Continue hypoglycemic medications as already ordered, these medications have been reviewed and there are no changes at this time.  Hgb A1C to be monitored as already arranged by primary service   Current Outpatient Medications on File Prior to Visit  Medication Sig Dispense Refill   Acetaminophen  (TYLENOL  PO) Take 3-4 tablets by mouth as needed (pain).     apixaban  (ELIQUIS ) 5 MG TABS tablet Take 1 tablet (5 mg total) by mouth 2 (two) times daily. 60 tablet 5   carvedilol  (COREG ) 6.25 MG tablet TAKE ONE TABLET (6.25 MG TOTAL) BY MOUTH TWO (TWO) TIMES DAILY WITH A MEAL. 60 tablet 5   Cholecalciferol  (D-3-5) 125 MCG (5000 UT) capsule Take 5,000 Units by mouth daily.     clopidogrel  (PLAVIX ) 75 MG tablet TAKE ONE TABLET BY MOUTH ONCE DAILY WITH BREAKFAST 90 tablet 3   Continuous Glucose Receiver (FREESTYLE LIBRE 2 READER) DEVI USE WITH SENSORS TO MONITOR SUGAR CONTINUOUSLY 1 each 0   Continuous Glucose Sensor (FREESTYLE LIBRE 2 SENSOR) MISC APPLY SENSOR EVERY 14 DAYS TO MONITOR SUGAR CONTINOUSLY 2 each 11   cyclobenzaprine  (FLEXERIL ) 10 MG tablet Take 0.5-1 tablets (5-10 mg total) by mouth at bedtime as needed for muscle spasms. 15 tablet 0   Dulaglutide  (TRULICITY ) 4.5 MG/0.5ML SOAJ INJECT 4.5MG  (0.5ML) UNDER THE SKIN ONCE A WEEK 6 mL 3   empagliflozin  (JARDIANCE ) 25 MG TABS tablet Take 1 tablet (25 mg total) by mouth daily before breakfast. 30 tablet 11   ENTRESTO  24-26 MG TAKE ONE TABLET BY MOUTH TWO TIMES DAILY. 60 tablet 3   ezetimibe  (ZETIA ) 10 MG tablet Take 1 tablet (10 mg total) by mouth daily. 90 tablet 3   furosemide  (LASIX ) 40 MG tablet Take 1 tablet (40 mg total) by mouth daily. 90 tablet 1   gabapentin  (NEURONTIN ) 100 MG capsule TAKE ONE CAPSULE (100 MG TOTAL) BY MOUTH AT BEDTIME. 30 capsule 3   HYDROcodone -acetaminophen  (NORCO) 10-325 MG tablet Take 0.5-1 tablets by mouth every 8 (eight) hours as needed. 90 tablet 0   insulin  aspart (NOVOLOG   FLEXPEN) 100 UNIT/ML FlexPen INJECT 13 UNITS UNDER THE SKIN EVERY MORNING AND THREE UNITS AS NEEDED  IN THE EVENING. 15 mL 2   isosorbide  mononitrate (IMDUR ) 30 MG 24 hr tablet TAKE ONE TABLET BY MOUTH TWICE DAILY 180 tablet 1   nitroGLYCERIN  (NITROSTAT ) 0.4 MG SL tablet DISSOLVE 1 TABLET UNDER TONGUE AS NEEDEDFOR CHEST PAIN. MAY REPEAT 5 MINUTES APART 3 TIMES IF NEEDED 25 tablet 3   spironolactone  (ALDACTONE ) 25 MG tablet TAKE ONE TABLET (25 MG TOTAL) BY MOUTH DAILY. 30 tablet 5   TRESIBA  FLEXTOUCH 100 UNIT/ML FlexTouch Pen INJECT 50 UNITS INTO THE SKIN DAILY. 15 mL 2   TRUEPLUS 5-BEVEL PEN NEEDLES 31G X 6 MM MISC USE TO INJECT INSULIN  THREE TIMES A DAY 300 each 3   venlafaxine  XR (EFFEXOR -XR) 150 MG 24 hr capsule TAKE ONE CAPSULE BY MOUTH DAILY WITH BREAKFAST. TAKE WITH EFFEXOR  XR 75MG  FOR A TOTAL OF 225MG  90 capsule 1   venlafaxine  XR (EFFEXOR -XR) 75 MG 24 hr capsule TAKE ONE CAPSULE BY MOUTH ONCE DAILY. TAKE IN ADDITION TO THE 150 MG CAPSULE FOR A TOTAL DOSE OF 225MG  DAILY 90 capsule 1   vitamin B-12 (CYANOCOBALAMIN ) 1000 MCG tablet Take 1,000 mcg by mouth daily.     No current facility-administered medications on file prior to visit.    There are no Patient Instructions on file for this visit. No follow-ups on file.   Annamaria Barrette, NP

## 2024-01-23 ENCOUNTER — Telehealth (INDEPENDENT_AMBULATORY_CARE_PROVIDER_SITE_OTHER): Payer: Self-pay

## 2024-01-23 ENCOUNTER — Other Ambulatory Visit: Payer: Self-pay | Admitting: Family Medicine

## 2024-01-23 LAB — VAS US ABI WITH/WO TBI
Left ABI: 0.77
Right ABI: 1.21

## 2024-01-23 NOTE — Telephone Encounter (Signed)
 Spoke with the patient and he is scheduled with Dr. Vonna Guardian for a LLE angio on 01/25/24 with a 6:45 am arrival time to the [facility]. Pre- procedure instructions were discussed and will be sent to Mychart and mailed.

## 2024-01-23 NOTE — Telephone Encounter (Signed)
 Last office visit 01/10/24 with Dr. Geralyn Knee for right knee pain.  Last refilled 12/19/23 for #15 with no refills.  Next appt: 02/07/24 with Dr. Geralyn Knee for knee.

## 2024-01-25 ENCOUNTER — Encounter
Admission: RE | Disposition: A | Discharge status: Home / Self Care | Payer: Self-pay | Source: Home / Self Care | Attending: Vascular Surgery

## 2024-01-25 ENCOUNTER — Ambulatory Visit
Admission: RE | Admit: 2024-01-25 | Discharge: 2024-01-25 | Disposition: A | Discharge status: Home / Self Care | Attending: Vascular Surgery | Admitting: Vascular Surgery

## 2024-01-25 ENCOUNTER — Encounter: Payer: Self-pay | Admitting: Vascular Surgery

## 2024-01-25 DIAGNOSIS — I152 Hypertension secondary to endocrine disorders: Secondary | ICD-10-CM | POA: Insufficient documentation

## 2024-01-25 DIAGNOSIS — I701 Atherosclerosis of renal artery: Secondary | ICD-10-CM | POA: Diagnosis not present

## 2024-01-25 DIAGNOSIS — Z7984 Long term (current) use of oral hypoglycemic drugs: Secondary | ICD-10-CM | POA: Diagnosis not present

## 2024-01-25 DIAGNOSIS — Z794 Long term (current) use of insulin: Secondary | ICD-10-CM | POA: Insufficient documentation

## 2024-01-25 DIAGNOSIS — Z7985 Long-term (current) use of injectable non-insulin antidiabetic drugs: Secondary | ICD-10-CM | POA: Insufficient documentation

## 2024-01-25 DIAGNOSIS — I70222 Atherosclerosis of native arteries of extremities with rest pain, left leg: Secondary | ICD-10-CM | POA: Insufficient documentation

## 2024-01-25 DIAGNOSIS — E11319 Type 2 diabetes mellitus with unspecified diabetic retinopathy without macular edema: Secondary | ICD-10-CM | POA: Insufficient documentation

## 2024-01-25 DIAGNOSIS — I70219 Atherosclerosis of native arteries of extremities with intermittent claudication, unspecified extremity: Secondary | ICD-10-CM

## 2024-01-25 HISTORY — PX: LOWER EXTREMITY ANGIOGRAPHY: CATH118251

## 2024-01-25 LAB — CREATININE, SERUM
Creatinine, Ser: 1.06 mg/dL (ref 0.61–1.24)
GFR, Estimated: >60 mL/min (ref >60)

## 2024-01-25 LAB — GLUCOSE, CAPILLARY
Glucose-Capillary: 135 mg/dL — ABNORMAL HIGH (ref 70–99)
Glucose-Capillary: 58 mg/dL — ABNORMAL LOW (ref 70–99)
Glucose-Capillary: 113 mg/dL — ABNORMAL HIGH (ref 70–99)

## 2024-01-25 LAB — BUN: BUN: 24 mg/dL — ABNORMAL HIGH (ref 8–23)

## 2024-01-25 SURGERY — LOWER EXTREMITY ANGIOGRAPHY
Anesthesia: Moderate Sedation | Site: Leg Lower | Laterality: Left

## 2024-01-25 MED ORDER — DEXTROSE 50 % IV SOLN
1.0000 | INTRAVENOUS | Status: AC
  Administered 2024-01-25: 50 mL via INTRAVENOUS

## 2024-01-25 MED ORDER — CEFAZOLIN SODIUM-DEXTROSE 2-4 GM/100ML-% IV SOLN
2.0000 g | INTRAVENOUS | Status: AC
  Administered 2024-01-25: 2 g via INTRAVENOUS

## 2024-01-25 MED ORDER — HEPARIN SODIUM (PORCINE) 1000 UNIT/ML IJ SOLN
INTRAMUSCULAR | Status: AC
Start: 2024-01-25 — End: ?
  Filled 2024-01-25: qty 10

## 2024-01-25 MED ORDER — ONDANSETRON HCL 4 MG/2ML IJ SOLN: 4.0000 mg | Freq: Four times a day (QID) | INTRAMUSCULAR | Status: DC | PRN

## 2024-01-25 MED ORDER — SODIUM CHLORIDE 0.9% FLUSH
3.0000 mL | INTRAVENOUS | Status: DC | PRN
Start: 2024-01-25 — End: 2024-01-25

## 2024-01-25 MED ORDER — LIDOCAINE-EPINEPHRINE (PF) 1 %-1:200000 IJ SOLN
INTRAMUSCULAR | Status: DC | PRN
Start: 2024-01-25 — End: 2024-01-25
  Administered 2024-01-25: 10 mL via INTRADERMAL

## 2024-01-25 MED ORDER — IODIXANOL 320 MG/ML IV SOLN
INTRAVENOUS | Status: DC | PRN
Start: 2024-01-25 — End: 2024-01-25
  Administered 2024-01-25: 45 mL via INTRA_ARTERIAL

## 2024-01-25 MED ORDER — HEPARIN (PORCINE) IN NACL 1000-0.9 UT/500ML-% IV SOLN
INTRAVENOUS | Status: DC | PRN
Start: 2024-01-25 — End: 2024-01-25
  Administered 2024-01-25: 1000 mL

## 2024-01-25 MED ORDER — SODIUM CHLORIDE 0.9 % IV SOLN: 250.0000 mL | INTRAVENOUS | Status: DC | PRN

## 2024-01-25 MED ORDER — SODIUM CHLORIDE 0.9 % IV SOLN: INTRAVENOUS | Status: DC

## 2024-01-25 MED ORDER — MIDAZOLAM HCL 2 MG/ML PO SYRP: 8.0000 mg | ORAL_SOLUTION | Freq: Once | ORAL | Status: DC | PRN

## 2024-01-25 MED ORDER — MIDAZOLAM HCL 5 MG/5ML IJ SOLN
INTRAMUSCULAR | Status: AC
  Filled 2024-01-25: qty 5

## 2024-01-25 MED ORDER — HEPARIN SODIUM (PORCINE) 1000 UNIT/ML IJ SOLN
INTRAMUSCULAR | Status: DC | PRN
Start: 2024-01-25 — End: 2024-01-25
  Administered 2024-01-25: 5000 [IU] via INTRAVENOUS

## 2024-01-25 MED ORDER — SODIUM CHLORIDE 0.9% FLUSH: 3.0000 mL | Freq: Two times a day (BID) | INTRAVENOUS | Status: DC

## 2024-01-25 MED ORDER — MIDAZOLAM HCL 2 MG/2ML IJ SOLN
INTRAMUSCULAR | Status: DC | PRN
Start: 2024-01-25 — End: 2024-01-25
  Administered 2024-01-25 (×3): .5 mg via INTRAVENOUS
  Administered 2024-01-25: 2 mg via INTRAVENOUS

## 2024-01-25 MED ORDER — HYDRALAZINE HCL 20 MG/ML IJ SOLN: 5.0000 mg | INTRAMUSCULAR | Status: DC | PRN

## 2024-01-25 MED ORDER — LABETALOL HCL 5 MG/ML IV SOLN: 10.0000 mg | INTRAVENOUS | Status: DC | PRN

## 2024-01-25 MED ORDER — FENTANYL CITRATE (PF) 100 MCG/2ML IJ SOLN
INTRAMUSCULAR | Status: AC
  Filled 2024-01-25: qty 2

## 2024-01-25 MED ORDER — FENTANYL CITRATE (PF) 100 MCG/2ML IJ SOLN
INTRAMUSCULAR | Status: DC | PRN
  Administered 2024-01-25 (×3): 25 ug via INTRAVENOUS
  Administered 2024-01-25: 50 ug via INTRAVENOUS

## 2024-01-25 MED ORDER — DIPHENHYDRAMINE HCL 50 MG/ML IJ SOLN: 50.0000 mg | Freq: Once | INTRAMUSCULAR | Status: DC | PRN

## 2024-01-25 MED ORDER — FAMOTIDINE 20 MG PO TABS: 40.0000 mg | ORAL_TABLET | Freq: Once | ORAL | Status: DC | PRN

## 2024-01-25 MED ORDER — ACETAMINOPHEN 325 MG PO TABS: 650.0000 mg | ORAL_TABLET | ORAL | Status: DC | PRN

## 2024-01-25 MED ORDER — DEXTROSE 50 % IV SOLN
INTRAVENOUS | Status: AC
  Filled 2024-01-25: qty 50

## 2024-01-25 SURGICAL SUPPLY — 18 items
BALLOON JADE .014 4.0 X 40 0 (BALLOONS) IMPLANT
BALLOON ULTRVRSE 2X300X150 OTW (BALLOONS) IMPLANT
BALLOON ULTRVRSE 3X100X150 (BALLOONS) IMPLANT
CATH ANGIO 5F PIGTAIL 65CM (CATHETERS) IMPLANT
CATH CXI SUPP ANG 4FR 135 (CATHETERS) IMPLANT
COVER PROBE ULTRASOUND 5X96 (MISCELLANEOUS) IMPLANT
DEVICE PRESTO INFLATION (MISCELLANEOUS) IMPLANT
DEVICE STARCLOSE SE CLOSURE (Vascular Products) IMPLANT
GLIDEWIRE ADV .035X260CM (WIRE) IMPLANT
PACK ANGIOGRAPHY (CUSTOM PROCEDURE TRAY) ×1 IMPLANT
SHEATH BRITE TIP 5FRX11 (SHEATH) IMPLANT
SHEATH RAABE 6FRX70 (SHEATH) IMPLANT
STENT ESPRIT BTK 3.7X38 SCAFF (Permanent Stent) IMPLANT
SYR MEDRAD MARK 7 150ML (SYRINGE) IMPLANT
TUBING CONTRAST HIGH PRESS 72 (TUBING) IMPLANT
WIRE COMMAND ST 018 300CM (WIRE) IMPLANT
WIRE COMMAND ST STR 014 300 (WIRE) IMPLANT
WIRE J 3MM .035X145CM (WIRE) IMPLANT

## 2024-01-25 NOTE — Op Note (Signed)
  [facility]  Percutaneous Study/Intervention Procedural Note   Date of Surgery: 01/25/2024  Surgeon(s):Massie Mees    Assistants:none  Pre-operative Diagnosis: PAD with rest pain LLE  Post-operative diagnosis:  Same  Procedure(s) Performed:             1.  Ultrasound guidance for vascular access right femoral artery             2.  Catheter placement into left common femoral artery from right femoral approach             3.  Aortogram and selective left lower extremity angiogram             4.  Percutaneous transluminal angioplasty of left peroneal artery with 2 mm diameter by 30 cm length angioplasty balloon             5.  Percutaneous transluminal angioplasty of left tibioperoneal trunk/distal popliteal artery with 3 mm diameter angioplasty balloon  6.  Stent placement to the left tibioperoneal trunk/distal popliteal artery with 3.75 mm diameter by 38 mm length Esprit scaffolding system             7.  StarClose closure device right femoral artery  EBL: 10 cc  Contrast: 45 cc  Fluoro Time: 8.9 minutes  Moderate Conscious Sedation Time: approximately 33 minutes using 3.5 mg of Versed  and 125 mcg of Fentanyl               Indications:  Patient is a 73 y.o.male with disabling claudication symptoms and early rest pain to the left foot. The patient has noninvasive study showing markedly reduced left ABI with monophasic waveforms and a reduced digital pressure. The patient is brought in for angiography for further evaluation and potential treatment.  Due to the limb threatening nature of the situation, angiogram was performed for attempted limb salvage. The patient is aware that if the procedure fails, amputation would be expected.  The patient also understands that even with successful revascularization, amputation may still be required due to the severity of the situation.  Risks and benefits are discussed and informed consent is obtained.   Procedure:  The  patient was identified and appropriate procedural time out was performed.  The patient was then placed supine on the table and prepped and draped in the usual sterile fashion. Moderate conscious sedation was administered during a face to face encounter with the patient throughout the procedure with my supervision of the RN administering medicines and monitoring the patient's vital signs, pulse oximetry, telemetry and mental status throughout from the start of the procedure until the patient was taken to the recovery room. Ultrasound was used to evaluate the right common femoral artery.  It was patent .  A digital ultrasound image was acquired.  A Seldinger needle was used to access the right common femoral artery under direct ultrasound guidance and a permanent image was performed.  A 0.035 J wire was advanced without resistance and a 5Fr sheath was placed.  Pigtail catheter was placed into the aorta and an AP aortogram was performed. This demonstrated normal renal artery, mild disease of the right renal artery, and normal aorta and iliac segments without significant stenosis. I then crossed the aortic bifurcation and advanced to the left femoral head. Selective left lower extremity angiogram was then performed. This demonstrated fairly normal common femoral artery, superficial femoral artery and proximal popliteal artery.  There was a very high takeoff of the anterior tibial artery above the knee.  In what would typically be the below-knee popliteal artery but anatomically was technically the tibioperoneal trunk there was about a 90 to 95% stenosis.  Following this, the peroneal artery was patent proximally but then occluded in the proximal to mid segment with reconstitution distally.  The posterior tibial artery was chronically occluded without distal reconstitution.  The anterior tibial artery with its high takeoff had a long segment occlusion with faint reconstitution of the anterior tibial artery in the foot.  It was felt that it was in the patient's best interest to proceed with intervention after these images to avoid a second procedure and a larger amount of contrast and fluoroscopy based off of the findings from the initial angiogram. The patient was systemically heparinized and a 6 French 70 cm Ansell sheath was then placed over the [employer] wire. I then used a Kumpe catheter and the advantage wire to first navigate into the anterior tibial artery.  I then exchanged for a command 18 wire and a CXI catheter and attempted to cross the occlusion in the anterior tibial artery without success.  I then turned my attention to the peroneal artery.  I crossed the stenosis and what would typically be the distal popliteal artery and tibioperoneal trunk without difficulty and then was able to cross the occlusion in the peroneal artery from the proximal to mid segment down to the distal segment and confirm intraluminal flow at the ankle.  I then placed a command 14 wire.  A 2 mm diameter by 30 cm length angioplasty balloon was used to treat the entirety of the peroneal artery occlusion.  This was inflated to 12 atm for 1 minute.  Following this, there was marked improvement with less than 20% residual stenosis in the peroneal artery.  I then turned my attention to the high-grade stenosis in the tibioperoneal trunk/distal popliteal artery.  A 3 mm diameter by 10 cm length angioplasty balloon was inflated to 14 atm for 1 minute but greater than 50% residual stenosis was seen.  I then used a 3.75 mm x 38 mm length Esprit scaffolding system and stented this area post dilating this with a 4 mm balloon with excellent angiographic completion result and less than 10% residual stenosis. I elected to terminate the procedure. The sheath was removed and StarClose closure device was deployed in the right femoral artery with excellent hemostatic result. The patient was taken to the recovery room in stable condition having tolerated the  procedure well.  Findings:               Aortogram:  This demonstrated normal renal artery, mild disease of the right renal artery, and normal aorta and iliac segments without significant stenosis.             Left Lower Extremity:  This demonstrated fairly normal common femoral artery, superficial femoral artery and proximal popliteal artery.  There was a very high takeoff of the anterior tibial artery above the knee.  In what would typically be the below-knee popliteal artery but anatomically was technically the tibioperoneal trunk there was about a 90 to 95% stenosis.  Following this, the peroneal artery was patent proximally but then occluded in the proximal to mid segment with reconstitution distally.  The posterior tibial artery was chronically occluded without distal reconstitution.  The anterior tibial artery with its high takeoff had a long segment occlusion with faint reconstitution of the anterior tibial artery in the foot   Disposition: Patient was taken to the recovery  room in stable condition having tolerated the procedure well.  Complications: None  Mikki Alexander 01/25/2024 9:02 AM   This note was created with Dragon Medical transcription system. Any errors in dictation are purely unintentional.

## 2024-01-25 NOTE — Interval H&P Note (Signed)
 History and Physical Interval Note:  01/25/2024 8:02 AM  [name]  has presented today for surgery, with the diagnosis of LLE Angio   ASO w claudication.  The various methods of treatment have been discussed with the patient and family. After consideration of risks, benefits and other options for treatment, the patient has consented to  Procedure(s): Lower Extremity Angiography (Left) as a surgical intervention.  [name] history has been reviewed, patient examined, no change in status, stable for surgery.  I have reviewed the patient's chart and labs.  Questions were answered to the patient's satisfaction.     Thoren Hosang

## 2024-01-25 NOTE — Progress Notes (Signed)
 Dr. Vonna Guardian in at bedside, speaking with pt. Re: procedural results. Pt. Verbalized understanding of conversation.

## 2024-01-26 ENCOUNTER — Ambulatory Visit: Admitting: Physician Assistant

## 2024-01-29 ENCOUNTER — Encounter: Attending: Physician Assistant | Admitting: Physician Assistant

## 2024-01-29 DIAGNOSIS — I2510 Atherosclerotic heart disease of native coronary artery without angina pectoris: Secondary | ICD-10-CM | POA: Diagnosis not present

## 2024-01-29 DIAGNOSIS — I10 Essential (primary) hypertension: Secondary | ICD-10-CM | POA: Diagnosis not present

## 2024-01-29 DIAGNOSIS — E11621 Type 2 diabetes mellitus with foot ulcer: Secondary | ICD-10-CM | POA: Diagnosis not present

## 2024-01-29 DIAGNOSIS — L97513 Non-pressure chronic ulcer of other part of right foot with necrosis of muscle: Secondary | ICD-10-CM | POA: Insufficient documentation

## 2024-01-29 DIAGNOSIS — I480 Paroxysmal atrial fibrillation: Secondary | ICD-10-CM | POA: Insufficient documentation

## 2024-01-29 DIAGNOSIS — L97512 Non-pressure chronic ulcer of other part of right foot with fat layer exposed: Secondary | ICD-10-CM | POA: Insufficient documentation

## 2024-01-29 DIAGNOSIS — Z7901 Long term (current) use of anticoagulants: Secondary | ICD-10-CM | POA: Insufficient documentation

## 2024-01-29 DIAGNOSIS — L97412 Non-pressure chronic ulcer of right heel and midfoot with fat layer exposed: Secondary | ICD-10-CM | POA: Insufficient documentation

## 2024-01-30 ENCOUNTER — Encounter: Payer: Self-pay | Admitting: *Deleted

## 2024-02-06 ENCOUNTER — Encounter: Admitting: Physician Assistant

## 2024-02-06 DIAGNOSIS — L97512 Non-pressure chronic ulcer of other part of right foot with fat layer exposed: Secondary | ICD-10-CM | POA: Diagnosis not present

## 2024-02-06 DIAGNOSIS — E11621 Type 2 diabetes mellitus with foot ulcer: Secondary | ICD-10-CM | POA: Diagnosis not present
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
## 2024-02-07 ENCOUNTER — Encounter: Payer: Self-pay | Admitting: Family Medicine

## 2024-02-07 ENCOUNTER — Ambulatory Visit (INDEPENDENT_AMBULATORY_CARE_PROVIDER_SITE_OTHER): Admitting: Family Medicine

## 2024-02-07 VITALS — BP 140/60 | HR 93 | Temp 98.3°F | Ht 66.0 in | Wt 270.4 lb

## 2024-02-07 DIAGNOSIS — M542 Cervicalgia: Secondary | ICD-10-CM

## 2024-02-07 DIAGNOSIS — S83241A Other tear of medial meniscus, current injury, right knee, initial encounter: Secondary | ICD-10-CM

## 2024-02-07 DIAGNOSIS — M1711 Unilateral primary osteoarthritis, right knee: Secondary | ICD-10-CM | POA: Diagnosis not present

## 2024-02-07 DIAGNOSIS — M25561 Pain in right knee: Secondary | ICD-10-CM | POA: Diagnosis not present

## 2024-02-07 DIAGNOSIS — M94261 Chondromalacia, right knee: Secondary | ICD-10-CM | POA: Diagnosis not present

## 2024-02-07 MED ORDER — TRIAMCINOLONE ACETONIDE 40 MG/ML IJ SUSP: 40.0000 mg | Freq: Once | INTRAMUSCULAR | Status: DC

## 2024-02-07 NOTE — Patient Instructions (Signed)
 Voltaren 1% gel, over the counter ?You can apply up to 4 times a day ? ?This can be applied to any joint: knee, wrist, fingers, elbows, shoulders, feet and ankles. ?Can apply to any tendon: tennis elbow, achilles, tendon, rotator cuff or any other tendon. ? ?Minimal is absorbed in the bloodstream: ok with oral anti-inflammatory or a blood thinner. ? ?Cost is about 9 dollars  ?

## 2024-02-15 ENCOUNTER — Other Ambulatory Visit: Payer: Self-pay | Admitting: Family Medicine

## 2024-02-16 NOTE — Telephone Encounter (Signed)
 Called pt to let him know that the request came in yesterday and Dr Cherlyn Cornet is out of the office. There is a 48-72 hour window they request for refills. I did say I would ask another provider in the office if they would look at the refills for him.

## 2024-02-16 NOTE — Telephone Encounter (Signed)
 Patient calling to check on the status of medication refill for HYDROcodone -acetaminophen  (NORCO) 10-325 MG tablet and patient states he is currently out of medication.

## 2024-02-16 NOTE — Telephone Encounter (Signed)
 se

## 2024-02-16 NOTE — Telephone Encounter (Signed)
 Last office visit 02/07/2024 with Dr. Geralyn Knee for knee pain.  Next Appt: 02/07/2024 with Dr. Geralyn Knee.     Last refilled Hydrocodone - acetaminophen  01/15/2024 for #90 with no refills.   Last refilled Cyclobenzaprine  01/23/24 # 15 w/ no refills

## 2024-02-19 ENCOUNTER — Other Ambulatory Visit: Payer: Self-pay | Admitting: *Deleted

## 2024-02-19 ENCOUNTER — Encounter: Payer: Self-pay | Admitting: *Deleted

## 2024-02-19 NOTE — Patient Outreach (Unsigned)
 Complex Care Management   Visit Note  02/19/2024  Name:  [name] MRN: [id number] DOB: Jan 20, 1951  Situation: Referral received for Complex Care Management related to Heart Failure and DM, knee pain, wound. I obtained verbal consent from Patient.  Visit completed with [name] on the phone  Background:   Past Medical History:  Diagnosis Date   Back injury 02/2002   worker's comp   CHF (congestive heart failure) (HCC)    Coronary artery disease, non-occlusive    a. cath 2002 with no sig CAD;  b. cath 2008 normal LM, LAD, LCx, p&dRCA 20-30%, PDA 30%; c.11/2015 NSTEMI/PCI: LM nl, LAD 95p (2.5x15 Xience DES), LCX nl, RCA 100p/m w/ L->R collats, EF 55-65% c. NSTEMI (02/2016) with no culprit leision, switched to Brilinta .  d. NSTEMI 03/2016: again, no culprit lesion and switched back to plavix  2/2 SOB with Brilnta.     Depression    Diabetes mellitus type 2, insulin  dependent (HCC)    Hyperlipemia    Hypertension    Hypertensive heart disease    Kidney stones    Macular degeneration    Morbid obesity (HCC)    Osteoarthritis    Snoring     Assessment: Patient Reported Symptoms:  Cognitive Cognitive Status: Alert and oriented to person, place, and time, Normal speech and language skills Cognitive/Intellectual Conditions Management [RPT]: None reported or documented in medical history or problem list   Health Maintenance Behaviors: Annual physical exam Healing Pattern: Slow Health Facilitated by: Rest  Neurological Neurological Review of Symptoms: Not assessed    HEENT HEENT Symptoms Reported: Not assessed      Cardiovascular      Respiratory      Endocrine      Gastrointestinal        Genitourinary      Integumentary      Musculoskeletal          Psychosocial              01/15/2024   12:15 PM  Depression screen PHQ 2/9  Decreased Interest 1  Down, Depressed, Hopeless 1  PHQ - 2 Score 2  Altered sleeping 2  Tired, decreased energy 3  Change in  appetite 0  Feeling bad or failure about yourself  1  Trouble concentrating 0  Moving slowly or fidgety/restless 0  Suicidal thoughts 0  PHQ-9 Score 8  Difficult doing work/chores Somewhat difficult    There were no vitals filed for this visit.  Medications Reviewed Today     Reviewed by Charlsie Josette SAILOR, RN (Registered Nurse) on 02/19/24 at 1226  Med List Status: <None>   Medication Order Taking? Sig Documenting Provider Last Dose Status Informant  Acetaminophen  (TYLENOL  PO) [id number] Yes Take 3-4 tablets by mouth as needed (pain). [provider]  Active Child, Pharmacy Records, Self           Med Note JOSEPHINA MORNA SAILOR Pablo Jan 02, 2023  2:23 PM)    apixaban  (ELIQUIS ) 5 MG TABS tablet [id number] Yes Take 1 tablet (5 mg total) by mouth 2 (two) times daily. [place] A, FNP  Active   carvedilol  (COREG ) 6.25 MG tablet [id number] Yes TAKE ONE TABLET (6.25 MG TOTAL) BY MOUTH TWO (TWO) TIMES DAILY WITH A MEAL. [place] A, FNP  Active   Cholecalciferol  (D-3-5) 125 MCG (5000 UT) capsule [id number] Yes Take 5,000 Units by mouth daily. [provider]  Active   clopidogrel  (PLAVIX ) 75 MG tablet [id number]  Yes TAKE ONE TABLET BY MOUTH ONCE DAILY WITH BREAKFAST Bedsole, Amy E, MD  Active   Continuous Glucose Receiver (FREESTYLE LIBRE 2 READER) DEVI [id number] Yes USE WITH SENSORS TO MONITOR SUGAR CONTINUOUSLY Bedsole, Amy E, MD  Active   Continuous Glucose Sensor (FREESTYLE LIBRE 2 SENSOR) MISC [id number] Yes APPLY SENSOR EVERY 14 DAYS TO MONITOR SUGAR CONTINOUSLY Bedsole, Amy E, MD  Active   cyclobenzaprine  (FLEXERIL ) 10 MG tablet [id number] Yes TAKE 0.5-1 TABLET (5-10 MG TOTAL) BY MOUTH AT BEDTIME AS NEEDED FOR MUSCLE SPASMS. Tower, Laine LABOR, MD  Active   Dulaglutide  (TRULICITY ) 4.5 MG/0.5ML EMMANUEL [id number] Yes INJECT 4.5MG  (0.5ML) UNDER THE SKIN ONCE A WEEK Bedsole, Amy E, MD  Active   empagliflozin  (JARDIANCE ) 25 MG TABS tablet [id number] Yes Take 1 tablet (25 mg total) by  mouth daily before breakfast. Avelina Greig BRAVO, MD  Active   ENTRESTO  24-26 MG [id number] Yes TAKE ONE TABLET BY MOUTH TWO TIMES DAILY. [place] A, FNP  Active   ezetimibe  (ZETIA ) 10 MG tablet [id number] Yes Take 1 tablet (10 mg total) by mouth daily. Gollan, Timothy J, MD  Active   furosemide  (LASIX ) 40 MG tablet [id number] Yes Take 1 tablet (40 mg total) by mouth daily. Avelina Greig BRAVO, MD  Active   gabapentin  (NEURONTIN ) 100 MG capsule [id number] Yes TAKE ONE CAPSULE (100 MG TOTAL) BY MOUTH AT BEDTIME. Avelina Greig BRAVO, MD  Active   HYDROcodone -acetaminophen  (NORCO) 10-325 MG tablet [id number] Yes Take 0.5-1 tablets by mouth every 8 (eight) hours as needed. Tower, Laine LABOR, MD  Active   insulin  aspart (NOVOLOG  FLEXPEN) 100 UNIT/ML FlexPen [id number] Yes INJECT 13 UNITS UNDER THE SKIN EVERY MORNING AND THREE UNITS AS NEEDED IN THE EVENING. Avelina Greig BRAVO, MD  Active            Med Note DARBY, ROBIN R   Thu Jan 25, 2024  7:23 AM) 65 units this AM  isosorbide  mononitrate (IMDUR ) 30 MG 24 hr tablet [id number] Yes TAKE ONE TABLET BY MOUTH TWICE DAILY Gollan, Timothy J, MD  Active   nitroGLYCERIN  (NITROSTAT ) 0.4 MG SL tablet [id number] Yes DISSOLVE 1 TABLET UNDER TONGUE AS NEEDEDFOR CHEST PAIN. MAY REPEAT 5 MINUTES APART 3 TIMES IF NEEDED Hammock, Sheri, NP  Active Self  spironolactone  (ALDACTONE ) 25 MG tablet [id number] Yes TAKE ONE TABLET (25 MG TOTAL) BY MOUTH DAILY. [place] LABOR, [place]  Active   TRESIBA  FLEXTOUCH 100 UNIT/ML FlexTouch Pen [id number] Yes INJECT 50 UNITS INTO THE SKIN DAILY. Avelina Greig BRAVO, MD  Active            Med Note DARBY, ROBIN R   Thu Jan 25, 2024  7:24 AM) Took 20 units this AM   TRUEPLUS 5-BEVEL PEN NEEDLES 31G X 6 MM MISC [id number] Yes USE TO INJECT INSULIN  THREE TIMES A DAY Bedsole, Amy E, MD  Active   venlafaxine  XR (EFFEXOR -XR) 150 MG 24 hr capsule [id number] Yes TAKE ONE CAPSULE BY MOUTH DAILY WITH BREAKFAST. TAKE WITH EFFEXOR  XR 75MG  FOR A TOTAL OF 225MG  Bedsole, Amy E, MD   Active   venlafaxine  XR (EFFEXOR -XR) 75 MG 24 hr capsule [id number] Yes TAKE ONE CAPSULE BY MOUTH ONCE DAILY. TAKE IN ADDITION TO THE 150 MG CAPSULE FOR A TOTAL DOSE OF 225MG  DAILY Bedsole, Amy E, MD  Active   vitamin B-12 (CYANOCOBALAMIN ) 1000 MCG tablet [id number] Yes Take 1,000 mcg by mouth daily. [provider]  Active Child, Pharmacy Records  Recommendation:   PCP Follow-up Specialty provider follow-up cardiologist and wound specialist Continue Current Plan of Care  Follow Up Plan:   Telephone follow up appointment date/time:  03/20/24 at 11:30 with Nestora Duos, RN Care Manager  Josette Pellet, RN, BSN [facility] RN Care Manager Direct Dial: [phone]  Fax: [phone]

## 2024-02-20 ENCOUNTER — Ambulatory Visit: Admitting: Physician Assistant

## 2024-02-22 ENCOUNTER — Ambulatory Visit
Admission: RE | Admit: 2024-02-22 | Discharge: 2024-02-22 | Disposition: A | Discharge status: Home / Self Care | Source: Ambulatory Visit | Attending: Family Medicine | Admitting: Family Medicine

## 2024-02-22 DIAGNOSIS — M2241 Chondromalacia patellae, right knee: Secondary | ICD-10-CM | POA: Diagnosis not present

## 2024-02-22 DIAGNOSIS — S83241A Other tear of medial meniscus, current injury, right knee, initial encounter: Secondary | ICD-10-CM | POA: Diagnosis not present

## 2024-02-22 DIAGNOSIS — M25461 Effusion, right knee: Secondary | ICD-10-CM | POA: Diagnosis not present

## 2024-02-22 DIAGNOSIS — M25561 Pain in right knee: Secondary | ICD-10-CM | POA: Diagnosis present

## 2024-02-22 DIAGNOSIS — M1711 Unilateral primary osteoarthritis, right knee: Secondary | ICD-10-CM | POA: Diagnosis not present

## 2024-02-22 DIAGNOSIS — M94261 Chondromalacia, right knee: Secondary | ICD-10-CM | POA: Insufficient documentation

## 2024-02-22 DIAGNOSIS — M2341 Loose body in knee, right knee: Secondary | ICD-10-CM | POA: Diagnosis not present

## 2024-02-22 DIAGNOSIS — R609 Edema, unspecified: Secondary | ICD-10-CM | POA: Diagnosis not present

## 2024-02-26 ENCOUNTER — Other Ambulatory Visit (HOSPITAL_COMMUNITY): Payer: Self-pay

## 2024-02-26 ENCOUNTER — Telehealth: Payer: Self-pay

## 2024-02-26 NOTE — Telephone Encounter (Signed)
 Pharmacy Patient Advocate Encounter   Received notification from CoverMyMeds that prior authorization for Cyclobenzaprine  HCl 10MG  tablets is required/requested.   Insurance verification completed.   The patient is insured through [facility] [employer]ANT[employer]/RX ADVANCE .   Per test claim: PA required; PA started via CoverMyMeds. KEY [id number] . Waiting for clinical questions to populate. Clinical questions have been answered and PA submitted.TO PLAN. PA currently Pending.

## 2024-02-27 ENCOUNTER — Other Ambulatory Visit: Payer: Self-pay | Admitting: Family Medicine

## 2024-02-27 ENCOUNTER — Ambulatory Visit: Attending: Cardiology | Admitting: Cardiology

## 2024-02-27 ENCOUNTER — Encounter: Payer: Self-pay | Admitting: Cardiology

## 2024-02-27 ENCOUNTER — Other Ambulatory Visit (INDEPENDENT_AMBULATORY_CARE_PROVIDER_SITE_OTHER): Payer: Self-pay | Admitting: Vascular Surgery

## 2024-02-27 VITALS — BP 98/46 | HR 82 | Ht 66.0 in | Wt 270.0 lb

## 2024-02-27 DIAGNOSIS — E1169 Type 2 diabetes mellitus with other specified complication: Secondary | ICD-10-CM

## 2024-02-27 DIAGNOSIS — E11319 Type 2 diabetes mellitus with unspecified diabetic retinopathy without macular edema: Secondary | ICD-10-CM

## 2024-02-27 DIAGNOSIS — I4819 Other persistent atrial fibrillation: Secondary | ICD-10-CM

## 2024-02-27 DIAGNOSIS — Z9889 Other specified postprocedural states: Secondary | ICD-10-CM

## 2024-02-27 DIAGNOSIS — Z794 Long term (current) use of insulin: Secondary | ICD-10-CM | POA: Diagnosis not present

## 2024-02-27 DIAGNOSIS — I5020 Unspecified systolic (congestive) heart failure: Secondary | ICD-10-CM

## 2024-02-27 DIAGNOSIS — I739 Peripheral vascular disease, unspecified: Secondary | ICD-10-CM | POA: Diagnosis not present

## 2024-02-27 DIAGNOSIS — I10 Essential (primary) hypertension: Secondary | ICD-10-CM

## 2024-02-27 DIAGNOSIS — E785 Hyperlipidemia, unspecified: Secondary | ICD-10-CM

## 2024-02-27 DIAGNOSIS — I2510 Atherosclerotic heart disease of native coronary artery without angina pectoris: Secondary | ICD-10-CM

## 2024-02-27 MED ORDER — CARVEDILOL 3.125 MG PO TABS: 3.1250 mg | ORAL_TABLET | Freq: Two times a day (BID) | ORAL | 3 refills | Status: AC
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
## 2024-02-27 NOTE — Telephone Encounter (Signed)
 Pharmacy Patient Advocate Encounter  Received notification from [facility] ADVANTAGE/RX ADVANCE that Prior Authorization for Cyclobenzaprine  HCl 10MG  tablets  has been APPROVED from 02/26/2024 to 03/25/2024 SEE OUTCOME BELOW  APPROVED on June 30 by [employer]  30-JUN-25:28-JUL-25 Cyclobenzaprine  HCl 10MG  OR TABS Quantity:15;  PA #/Case ID/Reference #: [id number]

## 2024-02-27 NOTE — Patient Instructions (Signed)
 Medication Instructions:  Your physician recommends the following medication changes.  DECREASE: Carvedilol  to 3.125 mg twice dialy  *If you need a refill on your cardiac medications before your next appointment, please call your pharmacy*  Lab Work: No labs ordered today  If you have labs (blood work) drawn today and your tests are completely normal, you will receive your results only by: MyChart Message (if you have MyChart) OR A paper copy in the mail If you have any lab test that is abnormal or we need to change your treatment, we will call you to review the results.  Testing/Procedures: No test ordered today   Follow-Up: At [facility], you and your health needs are our priority.  As part of our continuing mission to provide you with exceptional heart care, our providers are all part of one team.  This team includes your primary Cardiologist (physician) and Advanced Practice Providers or APPs (Physician Assistants and Nurse Practitioners) who all work together to provide you with the care you need, when you need it.  Your next appointment:   3 month(s)  Provider:   Timothy Gollan, MD or Tylene Lunch, NP

## 2024-02-27 NOTE — Telephone Encounter (Signed)
 Last office visit 02/07/24 with Dr. Watt for right knee pain.  Last refilled 06/202/025 for #15 with no refills.  Next Appt: 03/07/24 for DM.

## 2024-02-28 ENCOUNTER — Ambulatory Visit (INDEPENDENT_AMBULATORY_CARE_PROVIDER_SITE_OTHER): Admitting: Nurse Practitioner

## 2024-02-28 ENCOUNTER — Encounter (INDEPENDENT_AMBULATORY_CARE_PROVIDER_SITE_OTHER): Payer: Self-pay | Admitting: Nurse Practitioner

## 2024-02-28 ENCOUNTER — Other Ambulatory Visit (INDEPENDENT_AMBULATORY_CARE_PROVIDER_SITE_OTHER)

## 2024-02-28 VITALS — BP 113/73 | HR 80 | Ht 66.0 in | Wt 267.4 lb

## 2024-02-28 DIAGNOSIS — Z9889 Other specified postprocedural states: Secondary | ICD-10-CM | POA: Diagnosis not present

## 2024-02-28 DIAGNOSIS — Z794 Long term (current) use of insulin: Secondary | ICD-10-CM | POA: Diagnosis not present

## 2024-02-28 DIAGNOSIS — E11319 Type 2 diabetes mellitus with unspecified diabetic retinopathy without macular edema: Secondary | ICD-10-CM

## 2024-02-28 DIAGNOSIS — E1159 Type 2 diabetes mellitus with other circulatory complications: Secondary | ICD-10-CM

## 2024-02-28 DIAGNOSIS — I152 Hypertension secondary to endocrine disorders: Secondary | ICD-10-CM | POA: Diagnosis not present

## 2024-02-28 DIAGNOSIS — I739 Peripheral vascular disease, unspecified: Secondary | ICD-10-CM

## 2024-02-28 LAB — VAS US ABI WITH/WO TBI
Left ABI: 1.19
Right ABI: 1.44
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
## 2024-03-04 ENCOUNTER — Encounter: Attending: Physician Assistant | Admitting: Physician Assistant

## 2024-03-04 ENCOUNTER — Ambulatory Visit: Payer: Self-pay | Admitting: Family Medicine

## 2024-03-04 DIAGNOSIS — E11621 Type 2 diabetes mellitus with foot ulcer: Secondary | ICD-10-CM | POA: Diagnosis not present

## 2024-03-04 DIAGNOSIS — I10 Essential (primary) hypertension: Secondary | ICD-10-CM | POA: Insufficient documentation

## 2024-03-04 DIAGNOSIS — Z7901 Long term (current) use of anticoagulants: Secondary | ICD-10-CM | POA: Insufficient documentation

## 2024-03-04 DIAGNOSIS — L97512 Non-pressure chronic ulcer of other part of right foot with fat layer exposed: Secondary | ICD-10-CM | POA: Insufficient documentation

## 2024-03-04 DIAGNOSIS — I2510 Atherosclerotic heart disease of native coronary artery without angina pectoris: Secondary | ICD-10-CM | POA: Diagnosis not present

## 2024-03-04 DIAGNOSIS — I480 Paroxysmal atrial fibrillation: Secondary | ICD-10-CM | POA: Diagnosis not present

## 2024-03-04 DIAGNOSIS — L97513 Non-pressure chronic ulcer of other part of right foot with necrosis of muscle: Secondary | ICD-10-CM | POA: Diagnosis not present

## 2024-03-04 DIAGNOSIS — L97412 Non-pressure chronic ulcer of right heel and midfoot with fat layer exposed: Secondary | ICD-10-CM | POA: Diagnosis not present

## 2024-03-06 NOTE — Addendum Note (Signed)
 Addended by: WATT MIRZA on: 03/06/2024 08:09 PM   Modules accepted: Orders

## 2024-03-07 ENCOUNTER — Ambulatory Visit: Admitting: Family Medicine

## 2024-03-11 ENCOUNTER — Ambulatory Visit: Admitting: Physician Assistant

## 2024-03-11 DIAGNOSIS — M1711 Unilateral primary osteoarthritis, right knee: Secondary | ICD-10-CM | POA: Diagnosis not present

## 2024-03-12 ENCOUNTER — Ambulatory Visit (INDEPENDENT_AMBULATORY_CARE_PROVIDER_SITE_OTHER): Admitting: Family Medicine

## 2024-03-12 ENCOUNTER — Encounter: Payer: Self-pay | Admitting: Family Medicine

## 2024-03-12 VITALS — BP 130/60 | HR 92 | Temp 97.8°F | Ht 66.0 in | Wt 270.4 lb

## 2024-03-12 DIAGNOSIS — M545 Low back pain, unspecified: Secondary | ICD-10-CM

## 2024-03-12 DIAGNOSIS — E11319 Type 2 diabetes mellitus with unspecified diabetic retinopathy without macular edema: Secondary | ICD-10-CM

## 2024-03-12 DIAGNOSIS — Z794 Long term (current) use of insulin: Secondary | ICD-10-CM | POA: Diagnosis not present

## 2024-03-12 DIAGNOSIS — I152 Hypertension secondary to endocrine disorders: Secondary | ICD-10-CM

## 2024-03-12 DIAGNOSIS — M25561 Pain in right knee: Secondary | ICD-10-CM | POA: Diagnosis not present

## 2024-03-12 DIAGNOSIS — G8929 Other chronic pain: Secondary | ICD-10-CM

## 2024-03-12 DIAGNOSIS — E1159 Type 2 diabetes mellitus with other circulatory complications: Secondary | ICD-10-CM | POA: Diagnosis not present

## 2024-03-12 LAB — POCT GLYCOSYLATED HEMOGLOBIN (HGB A1C): Hemoglobin A1C: 7.5 % — AB (ref 4.0–5.6)

## 2024-03-12 MED ORDER — HYDROCODONE-ACETAMINOPHEN 10-325 MG PO TABS: 0.5000 | ORAL_TABLET | Freq: Three times a day (TID) | ORAL | 0 refills | Status: DC | PRN

## 2024-03-12 MED ORDER — CYCLOBENZAPRINE HCL 10 MG PO TABS: 10.0000 mg | ORAL_TABLET | Freq: Every day | ORAL | 0 refills | Status: DC

## 2024-03-12 NOTE — Assessment & Plan Note (Addendum)
 Chronic, significant improvement in  control  with increased dose of jardiance .  Trulicity  4.5 mg weekly. Tresiba  55 units and NovoLog   13 untis in Am and 3 units later in day prn Jardiance   25 mg p.o. daily   Reevaluate in 3 months.   Associated with retinopathy.

## 2024-03-12 NOTE — Progress Notes (Signed)
 Patient ID: [name], male    DOB: 1950-10-04, 73 y.o.   MRN: [id number]  This visit was conducted in person.  BP 130/60   Pulse 92   Temp 97.8 F (36.6 C) (Temporal)   Ht 5' 6 (1.676 m)   Wt 270 lb 6 oz (122.6 kg)   SpO2 94%   BMI 43.64 kg/m    CC: Chief Complaint  Patient presents with   Diabetes   Pain Management    Subjective:   HPI: [name] is a 73 y.o. male presenting on 03/12/2024 for Diabetes and Pain Management  Reviewed  cardiology office visit from  11/28/2023  atrial fibrillation, ischemic cardiomyopathy HFrEF Rate controlled with carvedilol  .. Did not tolerate 12.5 mg twice daily given low BP.. now back on 6.25 mg BID On Plavix  75 mg daily, started on apixaban  for anticoagulation. On anticoagulation: apixaban  5 mg BID  He underwent successful cardioversion on 03/02/2020  Heart failure controlled with continued on carvedilol  6.25 mg twice daily, Jardiance  10 mg daily, furosemide  40 mg daily and the second dose daily as needed for weight gain of 2 pounds or greater, Entresto  24/26 mg twice daily and spironolactone  25 mg daily.    PAD, followed by vascular ... May need to  stent.... hasve upcoming appt.  Being followed by wound care center, Dr. Bethena for right calcaneus wound.  On clopidogrel  75 mg p.o. daily BP Readings from Last 3 Encounters:  03/12/24 130/60  02/28/24 113/73  02/27/24 (!) 98/46   Diabetes:  recent  significant worsening of  control on Jardiance  and Trulicity , NovoLog  and Tresiba  50  Freestyle LIbre Lab Results  Component Value Date   HGBA1C 7.5 (A) 03/12/2024  Using medications without difficulties: Hypoglycemic episodes: if skip dinner.. has a low Hyperglycemic episodes:    occ if poor diet. Feet problems: PAD.. S/P bilateral stenting procedure.  Ulcers resolving.SABRA only one more follow up at wound care center.. Blood Sugars averaging: FBS 90-130 eye exam within last year: yes  Wt Readings from Last 3 Encounters:   03/12/24 270 lb 6 oz (122.6 kg)  02/28/24 267 lb 6 oz (121.3 kg)  02/27/24 270 lb (122.5 kg)    Indication for chronic opioid: chronic low back pain and knee pain  Has follow up with Ortho next week... recommending joint replacement. Medication and dose: oxycodone /acetaminophen  10/325 mg 1 to 2 tablets daily as needed for pain.   Requiring 3 times daily. # pills per month: 6 Last UDS date: 05/12/2022 Opioid Treatment Agreement signed (Y/N): 05/25/23 Opioid Treatment Agreement last reviewed with patient:   NCCSRS reviewed this encounter (include red flags): Yes   PDMP reviewed during this encounter. No red flags.  Relevant past medical, surgical, family and social history reviewed and updated as indicated. Interim medical history since our last visit reviewed. Allergies and medications reviewed and updated. Outpatient Medications Prior to Visit  Medication Sig Dispense Refill   Acetaminophen  (TYLENOL  PO) Take 3-4 tablets by mouth as needed (pain).     apixaban  (ELIQUIS ) 5 MG TABS tablet Take 1 tablet (5 mg total) by mouth 2 (two) times daily. 60 tablet 5   carvedilol  (COREG ) 3.125 MG tablet Take 1 tablet (3.125 mg total) by mouth 2 (two) times daily. 180 tablet 3   Cholecalciferol  (D-3-5) 125 MCG (5000 UT) capsule Take 5,000 Units by mouth daily.     clopidogrel  (PLAVIX ) 75 MG tablet TAKE ONE TABLET BY MOUTH ONCE DAILY WITH BREAKFAST 90 tablet 3  Continuous Glucose Receiver (FREESTYLE LIBRE 2 READER) DEVI USE WITH SENSORS TO MONITOR SUGAR CONTINUOUSLY 1 each 0   Continuous Glucose Sensor (FREESTYLE LIBRE 2 SENSOR) MISC APPLY SENSOR EVERY 14 DAYS TO MONITOR SUGAR CONTINOUSLY 2 each 11   Dulaglutide  (TRULICITY ) 4.5 MG/0.5ML SOAJ INJECT 4.5MG  (0.5ML) UNDER THE SKIN ONCE A WEEK 6 mL 3   empagliflozin  (JARDIANCE ) 25 MG TABS tablet Take 1 tablet (25 mg total) by mouth daily before breakfast. 30 tablet 11   ENTRESTO  24-26 MG TAKE ONE TABLET BY MOUTH TWO TIMES DAILY. 60 tablet 3   ezetimibe   (ZETIA ) 10 MG tablet Take 1 tablet (10 mg total) by mouth daily. 90 tablet 3   furosemide  (LASIX ) 40 MG tablet Take 1 tablet (40 mg total) by mouth daily. 90 tablet 1   gabapentin  (NEURONTIN ) 100 MG capsule TAKE ONE CAPSULE (100 MG TOTAL) BY MOUTH AT BEDTIME. 30 capsule 3   insulin  aspart (NOVOLOG  FLEXPEN) 100 UNIT/ML FlexPen INJECT 13 UNITS UNDER THE SKIN EVERY MORNING AND THREE UNITS AS NEEDED IN THE EVENING. 15 mL 2   isosorbide  mononitrate (IMDUR ) 30 MG 24 hr tablet TAKE ONE TABLET BY MOUTH TWICE DAILY 180 tablet 1   nitroGLYCERIN  (NITROSTAT ) 0.4 MG SL tablet DISSOLVE 1 TABLET UNDER TONGUE AS NEEDEDFOR CHEST PAIN. MAY REPEAT 5 MINUTES APART 3 TIMES IF NEEDED 25 tablet 3   spironolactone  (ALDACTONE ) 25 MG tablet TAKE ONE TABLET (25 MG TOTAL) BY MOUTH DAILY. 30 tablet 5   TRESIBA  FLEXTOUCH 100 UNIT/ML FlexTouch Pen INJECT 50 UNITS INTO THE SKIN DAILY. 15 mL 2   TRUEPLUS 5-BEVEL PEN NEEDLES 31G X 6 MM MISC USE TO INJECT INSULIN  THREE TIMES A DAY 300 each 3   venlafaxine  XR (EFFEXOR -XR) 150 MG 24 hr capsule TAKE ONE CAPSULE BY MOUTH DAILY WITH BREAKFAST. TAKE WITH EFFEXOR  XR 75MG  FOR A TOTAL OF 225MG  90 capsule 1   venlafaxine  XR (EFFEXOR -XR) 75 MG 24 hr capsule TAKE ONE CAPSULE BY MOUTH ONCE DAILY. TAKE IN ADDITION TO THE 150 MG CAPSULE FOR A TOTAL DOSE OF 225MG  DAILY 90 capsule 1   vitamin B-12 (CYANOCOBALAMIN ) 1000 MCG tablet Take 1,000 mcg by mouth daily.     cyclobenzaprine  (FLEXERIL ) 10 MG tablet TAKE 0.5-1 TABLET (5-10 MG TOTAL) BY MOUTH AT BEDTIME AS NEEDED FOR MUSCLE SPASMS. 15 tablet 0   HYDROcodone -acetaminophen  (NORCO) 10-325 MG tablet Take 0.5-1 tablets by mouth every 8 (eight) hours as needed. 90 tablet 0   No facility-administered medications prior to visit.     Per HPI unless specifically indicated in ROS section below Review of Systems  Constitutional:  Negative for fatigue and fever.  HENT:  Negative for ear pain.   Eyes:  Negative for pain.  Respiratory:  Negative for cough  and shortness of breath.   Cardiovascular:  Negative for chest pain, palpitations and leg swelling.  Gastrointestinal:  Negative for abdominal pain.  Genitourinary:  Negative for dysuria.  Musculoskeletal:  Negative for arthralgias.  Neurological:  Negative for syncope, light-headedness and headaches.  Psychiatric/Behavioral:  Negative for dysphoric mood.    Objective:  BP 130/60   Pulse 92   Temp 97.8 F (36.6 C) (Temporal)   Ht 5' 6 (1.676 m)   Wt 270 lb 6 oz (122.6 kg)   SpO2 94%   BMI 43.64 kg/m   Wt Readings from Last 3 Encounters:  03/12/24 270 lb 6 oz (122.6 kg)  02/28/24 267 lb 6 oz (121.3 kg)  02/27/24 270 lb (122.5 kg)  Physical Exam Constitutional:      Appearance: He is well-developed. He is obese.  HENT:     Head: Normocephalic.     Right Ear: Hearing normal.     Left Ear: Hearing normal.     Nose: Nose normal.  Neck:     Thyroid : No thyroid  mass or thyromegaly.     Vascular: No carotid bruit.     Trachea: Trachea normal.  Cardiovascular:     Rate and Rhythm: Normal rate and regular rhythm.     Pulses: Normal pulses.     Heart sounds: Heart sounds not distant. No murmur heard.    No friction rub. No gallop.     Comments: No peripheral edema Pulmonary:     Effort: Pulmonary effort is normal. No respiratory distress.     Breath sounds: Normal breath sounds.  Musculoskeletal:     Cervical back: Pain with movement, spinous process tenderness and muscular tenderness present. Decreased range of motion.     Thoracic back: Normal.     Lumbar back: Normal.     Right knee: Swelling and effusion present. No deformity, ecchymosis or bony tenderness. Decreased range of motion. Tenderness present over the medial joint line and lateral joint line. No MCL, LCL, ACL, PCL or patellar tendon tenderness. Normal alignment.     Instability Tests: Anterior drawer test negative. Posterior drawer test negative. Anterior Lachman test negative. Medial McMurray test negative  and lateral McMurray test negative.  Skin:    General: Skin is warm and dry.     Findings: No rash.  Psychiatric:        Speech: Speech normal.        Behavior: Behavior normal.        Thought Content: Thought content normal.     Diabetic foot exam:followed by wound care currently. Bandage in place.     Results for orders placed or performed in visit on 03/12/24  POCT glycosylated hemoglobin (Hb A1C)   Collection Time: 03/12/24  3:43 PM  Result Value Ref Range   Hemoglobin A1C 7.5 (A) 4.0 - 5.6 %   HbA1c POC (<> result, manual entry)     HbA1c, POC (prediabetic range)     HbA1c, POC (controlled diabetic range)     *Note: Due to a large number of results and/or encounters for the requested time period, some results have not been displayed. A complete set of results can be found in Results Review.    This visit occurred during the SARS-CoV-2 public health emergency.  Safety protocols were in place, including screening questions prior to the visit, additional usage of staff PPE, and extensive cleaning of exam room while observing appropriate contact time as indicated for disinfecting solutions.   COVID 19 screen:  No recent travel or known exposure to COVID19 The patient denies respiratory symptoms of COVID 19 at this time. The importance of social distancing was discussed today.   Assessment and Plan    Problem List Items Addressed This Visit     Acute pain of right knee   Acute, current pain need primarily regarding right knee pain, severe.  He continues to require oxycodone /acetaminophen  10/325 1 to 2 tablets daily. He has an appointment next week to continue discuss possible knee replacement with orthopedics per patient.      Chronic midline low back pain   Chronic, back pain controlled with current pain medication.      Relevant Medications   HYDROcodone -acetaminophen  (NORCO) 10-325 MG tablet   cyclobenzaprine  (  FLEXERIL ) 10 MG tablet   Diabetes mellitus type 2 with  retinopathy (HCC) - Primary   Chronic, significant improvement in  control  with increased dose of jardiance .  Trulicity  4.5 mg weekly. Tresiba  55 units and NovoLog   13 untis in Am and 3 units later in day prn Jardiance   25 mg p.o. daily   Reevaluate in 3 months.   Associated with retinopathy.      Relevant Orders   POCT glycosylated hemoglobin (Hb A1C) (Completed)   Microalbumin / creatinine urine ratio   Hypertension associated with diabetes (HCC) (Chronic)   Chronic, well-controlled in office today  Coreg  6.25 milligrams p.o. twice daily, losartan  50 mg p.o. daily  Lasix  40 mg daily Spironolactone  25 mg p.o. daily  Entresto  24/26 mg daily          Greig Ring, MD

## 2024-03-12 NOTE — Assessment & Plan Note (Signed)
 Chronic, well-controlled in office today  Coreg 6.25 milligrams p.o. twice daily, losartan 50 mg p.o. daily  Lasix 40 mg daily Spironolactone 25 mg p.o. daily  Entresto 24/26 mg daily

## 2024-03-12 NOTE — Assessment & Plan Note (Signed)
 Acute, current pain need primarily regarding right knee pain, severe.  He continues to require oxycodone /acetaminophen  10/325 1 to 2 tablets daily. He has an appointment next week to continue discuss possible knee replacement with orthopedics per patient.

## 2024-03-12 NOTE — Assessment & Plan Note (Signed)
 Chronic, back pain controlled with current pain medication.

## 2024-03-13 ENCOUNTER — Ambulatory Visit: Payer: Self-pay | Admitting: Family Medicine

## 2024-03-13 ENCOUNTER — Other Ambulatory Visit: Payer: Self-pay | Admitting: Family

## 2024-03-13 ENCOUNTER — Other Ambulatory Visit: Payer: Self-pay | Admitting: Cardiovascular Disease

## 2024-03-13 ENCOUNTER — Other Ambulatory Visit: Payer: Self-pay | Admitting: Family Medicine

## 2024-03-13 LAB — MICROALBUMIN / CREATININE URINE RATIO
Creatinine,U: 35.3 mg/dL
Microalb Creat Ratio: 108.5 mg/g — ABNORMAL HIGH (ref 0.0–30.0)
Microalb, Ur: 3.8 mg/dL — ABNORMAL HIGH (ref 0.0–1.9)

## 2024-03-18 DIAGNOSIS — M1711 Unilateral primary osteoarthritis, right knee: Secondary | ICD-10-CM | POA: Diagnosis not present

## 2024-03-20 ENCOUNTER — Other Ambulatory Visit: Payer: Self-pay
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
## 2024-03-20 NOTE — Patient Instructions (Signed)
 Visit Information  Thank you for taking time to visit with me today. Please don't hesitate to contact me if I can be of assistance to you before our next scheduled appointment.  Your next care management appointment is by telephone on 04/17/2024  at 11:00 am  Telephone follow-up in 1 month  Please call the care guide team at [phone] if you need to cancel, schedule, or reschedule an appointment.   Please call the Suicide and Crisis Lifeline: 988 call the [place]  National Suicide Prevention Lifeline: 443-815-3156 or TTY: 856-568-3676 TTY 312-458-2427) to talk to a trained counselor call 1-800-273-TALK (toll free, 24 hour hotline) go to [facility] [address], [place] [id number]) call 911 if you are experiencing a Mental Health or Behavioral Health Crisis or need someone to talk to.  Nestora Duos, MSN, RN [place]  [name], [facility] RN Care Manager Direct Dial: [phone] Fax: [phone]   Heart-Healthy Eating Plan Many factors influence your heart health, including eating and exercise habits. Heart health is also called coronary health. Coronary risk increases with abnormal blood fat (lipid) levels. A heart-healthy eating plan includes limiting unhealthy fats, increasing healthy fats, limiting salt (sodium) intake, and making other diet and lifestyle changes. What is my plan? Your health care provider may recommend that: You limit your fat intake to _________% or less of your total calories each day. You limit your saturated fat intake to _________% or less of your total calories each day. You limit the amount of cholesterol in your diet to less than _________ mg per day. You limit the amount of sodium in your diet to less than _________ mg per day. What are tips for following this plan? Cooking Cook foods using methods other than frying. Baking, boiling, grilling, and broiling are all good options.  Other ways to reduce fat include: Removing the skin from poultry. Removing all visible fats from meats. Steaming vegetables in water or broth. Meal planning  At meals, imagine dividing your plate into fourths: Fill one-half of your plate with vegetables and green salads. Fill one-fourth of your plate with whole grains. Fill one-fourth of your plate with lean protein foods. Eat 2-4 cups of vegetables per day. One cup of vegetables equals 1 cup (91 g) broccoli or cauliflower florets, 2 medium carrots, 1 large bell pepper, 1 large sweet potato, 1 large tomato, 1 medium white potato, 2 cups (150 g) raw leafy greens. Eat 1-2 cups of fruit per day. One cup of fruit equals 1 small apple, 1 large banana, 1 cup (237 g) mixed fruit, 1 large orange,  cup (82 g) dried fruit, 1 cup (240 mL) 100% fruit juice. Eat more foods that contain soluble fiber. Examples include apples, broccoli, carrots, beans, peas, and barley. Aim to get 25-30 g of fiber per day. Increase your consumption of legumes, nuts, and seeds to 4-5 servings per week. One serving of dried beans or legumes equals  cup (90 g) cooked, 1 serving of nuts is  oz (12 almonds, 24 pistachios, or 7 walnut halves), and 1 serving of seeds equals  oz (8 g). Fats Choose healthy fats more often. Choose monounsaturated and polyunsaturated fats, such as olive and canola oils, avocado oil, flaxseeds, walnuts, almonds, and seeds. Eat more omega-3 fats. Choose salmon, mackerel, sardines, tuna, flaxseed oil, and ground flaxseeds. Aim to eat fish at least 2 times each week. Check food labels carefully to identify foods with trans fats or high amounts of saturated fat.  Limit saturated fats. These are found in animal products, such as meats, butter, and cream. Plant sources of saturated fats include palm oil, palm kernel oil, and coconut oil. Avoid foods with partially hydrogenated oils in them. These contain trans fats. Examples are stick margarine, some tub  margarines, cookies, crackers, and other baked goods. Avoid fried foods. General information Eat more home-cooked food and less restaurant, buffet, and fast food. Limit or avoid alcohol. Limit foods that are high in added sugar and simple starches such as foods made using white refined flour (white breads, pastries, sweets). Lose weight if you are overweight. Losing just 5-10% of your body weight can help your overall health and prevent diseases such as diabetes and heart disease. Monitor your sodium intake, especially if you have high blood pressure. Talk with your health care provider about your sodium intake. Try to incorporate more vegetarian meals weekly. What foods should I eat? Fruits All fresh, canned (in natural juice), or frozen fruits. Vegetables Fresh or frozen vegetables (raw, steamed, roasted, or grilled). Green salads. Grains Most grains. Choose whole wheat and whole grains most of the time. Rice and pasta, including brown rice and pastas made with whole wheat. Meats and other proteins Lean, well-trimmed beef, veal, pork, and lamb. Chicken and [place] without skin. All fish and shellfish. Wild duck, rabbit, pheasant, and venison. Egg whites or low-cholesterol egg substitutes. Dried beans, peas, lentils, and tofu. Seeds and most nuts. Dairy Low-fat or nonfat cheeses, including ricotta and mozzarella. Skim or 1% milk (liquid, powdered, or evaporated). Buttermilk made with low-fat milk. Nonfat or low-fat yogurt. Fats and oils Non-hydrogenated (trans-free) margarines. Vegetable oils, including soybean, sesame, sunflower, olive, avocado, peanut, safflower, corn, canola, and cottonseed. Salad dressings or mayonnaise made with a vegetable oil. Beverages Water (mineral or sparkling). Coffee and tea. Unsweetened ice tea. Diet beverages. Sweets and desserts Sherbet, gelatin, and fruit ice. Small amounts of dark chocolate. Limit all sweets and desserts. Seasonings and condiments All  seasonings and condiments. The items listed above may not be a complete list of foods and beverages you can eat. Contact a dietitian for more options. What foods should I avoid? Fruits Canned fruit in heavy syrup. Fruit in cream or butter sauce. Fried fruit. Limit coconut. Vegetables Vegetables cooked in cheese, cream, or butter sauce. Fried vegetables. Grains Breads made with saturated or trans fats, oils, or whole milk. Croissants. Sweet rolls. Donuts. High-fat crackers, such as cheese crackers and chips. Meats and other proteins Fatty meats, such as hot dogs, ribs, sausage, bacon, rib-eye roast or steak. High-fat deli meats, such as salami and bologna. Caviar. Domestic duck and goose. Organ meats, such as liver. Dairy Cream, sour cream, cream cheese, and creamed cottage cheese. Whole-milk cheeses. Whole or 2% milk (liquid, evaporated, or condensed). Whole buttermilk. Cream sauce or high-fat cheese sauce. Whole-milk yogurt. Fats and oils Meat fat, or shortening. Cocoa butter, hydrogenated oils, palm oil, coconut oil, palm kernel oil. Solid fats and shortenings, including bacon fat, salt pork, lard, and butter. Nondairy cream substitutes. Salad dressings with cheese or sour cream. Beverages Regular sodas and any drinks with added sugar. Sweets and desserts Frosting. Pudding. Cookies. Cakes. Pies. Milk chocolate or white chocolate. Buttered syrups. Full-fat ice cream or ice cream drinks. The items listed above may not be a complete list of foods and beverages to avoid. Contact a dietitian for more information. Summary Heart-healthy meal planning includes limiting unhealthy fats, increasing healthy fats, limiting salt (sodium) intake and making other diet and lifestyle changes.  Lose weight if you are overweight. Losing just 5-10% of your body weight can help your overall health and prevent diseases such as diabetes and heart disease. Focus on eating a balance of foods, including fruits and  vegetables, low-fat or nonfat dairy, lean protein, nuts and legumes, whole grains, and heart-healthy oils and fats. This information is not intended to replace advice given to you by your health care provider. Make sure you discuss any questions you have with your health care provider. Document Revised: 09/20/2021 Document Reviewed: 09/20/2021 Elsevier Patient Education  2024 Elsevier Inc.Quality Sleep Information, Adult Quality sleep is important for your mental and physical health. It also improves your quality of life. Quality sleep means you: Are asleep for most of the time you are in bed. Fall asleep within 30 minutes. Wake up no more than once a night. Are awake for no longer than 20 minutes if you do wake up during the night. Most adults need 7-8 hours of quality sleep each night. How can poor sleep affect me? If you do not get enough quality sleep, you may have: Mood swings. Daytime sleepiness. Decreased alertness, reaction time, and concentration. Sleep disorders, such as insomnia and sleep apnea. Difficulty with: Solving problems. Coping with stress. Paying attention. These issues may affect your performance and productivity at work, school, and home. Lack of sleep may also put you at higher risk for accidents, suicide, and risky behaviors. If you do not get quality sleep, you may also be at higher risk for several health problems, including: Infections. Type 2 diabetes. Heart disease. High blood pressure. Obesity. Worsening of long-term conditions, like arthritis, kidney disease, depression, Parkinson's disease, and epilepsy. What actions can I take to get more quality sleep? Sleep schedule and routine Stick to a sleep schedule. Go to sleep and wake up at about the same time each day. Do not try to sleep less on weekdays and make up for lost sleep on weekends. This does not work. Limit naps during the day to 30 minutes or less. Do not take naps in the late afternoon. Make  time to relax before bed. Reading, listening to music, or taking a hot bath promotes quality sleep. Make your bedroom a place that promotes quality sleep. Keep your bedroom dark, quiet, and at a comfortable room temperature. Make sure your bed is comfortable. Avoid using electronic devices that give off bright blue light for 30 minutes before bedtime. Your brain perceives bright blue light as sunlight. This includes television, phones, and computers. If you are lying awake in bed for longer than 20 minutes, get up and do a relaxing activity until you feel sleepy. Lifestyle     Try to get at least 30 minutes of exercise on most days. Do not exercise 2-3 hours before going to bed. Do not use any products that contain nicotine or tobacco. These products include cigarettes, chewing tobacco, and vaping devices, such as e-cigarettes. If you need help quitting, ask your health care provider. Do not drink caffeinated beverages for at least 8 hours before going to bed. Coffee, tea, and some sodas contain caffeine. Do not drink alcohol or eat large meals close to bedtime. Try to get at least 30 minutes of sunlight every day. Morning sunlight is best. Medical concerns Work with your health care provider to treat medical conditions that may affect sleeping, such as: Nasal obstruction. Snoring. Sleep apnea and other sleep disorders. Talk to your health care provider if you think any of your prescription medicines may  cause you to have difficulty falling or staying asleep. If you have sleep problems, talk with a sleep consultant. If you think you have a sleep disorder, talk with your health care provider about getting evaluated by a specialist. Where to find more information Sleep Foundation: sleepfoundation.org American Academy of Sleep Medicine: aasm.org Centers for Disease Control and Prevention (CDC): TonerPromos.no Contact a health care provider if: You have trouble getting to sleep or staying asleep. You  often wake up very early in the morning and cannot get back to sleep. You have daytime sleepiness. You have daytime sleep attacks of suddenly falling asleep and sudden muscle weakness (narcolepsy). You have a tingling sensation in your legs with a strong urge to move your legs (restless legs syndrome). You stop breathing briefly during sleep (sleep apnea). You think you have a sleep disorder or are taking a medicine that is affecting your quality of sleep. Summary Most adults need 7-8 hours of quality sleep each night. Getting enough quality sleep is important for your mental and physical health. Make your bedroom a place that promotes quality sleep, and avoid things that may cause you to have poor sleep, such as alcohol, caffeine, smoking, or large meals. Talk to your health care provider if you have trouble falling asleep or staying asleep. This information is not intended to replace advice given to you by your health care provider. Make sure you discuss any questions you have with your health care provider. Document Revised: 12/08/2021 Document Reviewed: 12/08/2021 Elsevier Patient Education  2024 Elsevier Inc.  Heart Failure Action Plan A heart failure action plan helps you know what to do when you have symptoms of heart failure. Your action plan is a color-coded plan that lists the symptoms to watch for and indicates what actions to take. If you have symptoms in the green zone, you're doing well. If you have symptoms in the yellow zone, you're having problems. If you have symptoms in the red zone, you need medical care right away. Follow the plan that was created by you and your health care provider. Review your plan each time you visit your provider. Green zone These signs mean you're doing well and can continue what you're doing: You don't have new or worsening shortness of breath. You have very little swelling or no new swelling. Your weight is stable (no gain or loss). You have a  normal activity level. You don't have chest pain or any other new symptoms. Yellow zone These signs and symptoms mean your condition may be getting worse and you should make some changes: You have trouble breathing when you're active. You have swelling in your feet or legs or have discomfort in your belly. You gain 2-3 lb (0.9-1.4 kg) in 24 hours, or 5 lb (2.3 kg) in a week. This amount may be more or less depending on your condition. You get tired easily. You have trouble sleeping. You have a dry cough. If you have any of these symptoms: Contact your provider within the next day. Your provider may adjust your medicines. Red zone These signs and symptoms mean you should get medical help right away: You have trouble breathing when resting or cannot lie flat and you need to raise your head to help you breathe. You have a dry cough that's getting worse. You have swelling or pain in your feet or legs or discomfort in your belly that's getting worse. You suddenly gain more than 2-3 lb (0.9-1.4 kg) in 24 hours, or more than 5  lb (2.3 kg) in a week. This amount may be more or less depending on your condition. You have trouble staying awake or you feel confused. You don't have an appetite. You have worsening sadness or depression. These symptoms may be an emergency. Call 911 right away. Do not wait to see if the symptoms will go away. Do not drive yourself to the hospital. Follow these instructions at home: Take medicines only as told. Eat a heart-healthy diet. Work with a dietitian to create an eating plan that's best for you. Weigh yourself each day. Your target weight is __________ lb (__________ kg). Call your provider if you gain more than __________ lb (__________ kg) in 24 hours, or more than __________ lb (__________ kg) in a week. Health care provider name: _____________________________________________________ Health care provider phone number:  _____________________________________________________ Where to find more information American Heart Association: heart.org This information is not intended to replace advice given to you by your health care provider. Make sure you discuss any questions you have with your health care provider. Document Revised: 03/30/2023 Document Reviewed: 03/30/2023 Elsevier Patient Education  2024 [employer].

## 2024-03-25 ENCOUNTER — Encounter: Admitting: Physician Assistant

## 2024-03-25 DIAGNOSIS — L97512 Non-pressure chronic ulcer of other part of right foot with fat layer exposed: Secondary | ICD-10-CM | POA: Diagnosis not present

## 2024-03-25 DIAGNOSIS — E11621 Type 2 diabetes mellitus with foot ulcer: Secondary | ICD-10-CM | POA: Diagnosis not present

## 2024-04-04 ENCOUNTER — Ambulatory Visit: Admitting: Physician Assistant

## 2024-04-09 ENCOUNTER — Telehealth: Payer: Self-pay

## 2024-04-09 DIAGNOSIS — H3582 Retinal ischemia: Secondary | ICD-10-CM | POA: Diagnosis not present

## 2024-04-09 DIAGNOSIS — H35372 Puckering of macula, left eye: Secondary | ICD-10-CM | POA: Diagnosis not present

## 2024-04-09 DIAGNOSIS — H34813 Central retinal vein occlusion, bilateral, with macular edema: Secondary | ICD-10-CM | POA: Diagnosis not present

## 2024-04-09 DIAGNOSIS — E113393 Type 2 diabetes mellitus with moderate nonproliferative diabetic retinopathy without macular edema, bilateral: Secondary | ICD-10-CM | POA: Diagnosis not present

## 2024-04-09 DIAGNOSIS — H3563 Retinal hemorrhage, bilateral: Secondary | ICD-10-CM | POA: Diagnosis not present

## 2024-04-09 DIAGNOSIS — H35033 Hypertensive retinopathy, bilateral: Secondary | ICD-10-CM | POA: Diagnosis not present

## 2024-04-09 DIAGNOSIS — E11319 Type 2 diabetes mellitus with unspecified diabetic retinopathy without macular edema: Secondary | ICD-10-CM

## 2024-04-09 MED ORDER — FREESTYLE LIBRE 2 PLUS SENSOR MISC: 3 refills | Status: AC

## 2024-04-09 NOTE — Telephone Encounter (Signed)
 Copied from CRM [id number]. Topic: Clinical - Prescription Issue >> Apr 09, 2024 12:33 PM [place] J wrote: Reason for CRM: [name] Glucose Sensor (FREESTYLE LIBRE 2 SENSOR) is being discontinued by the manufacturer. The pharmacist would like Dr. Avelina to know that the Freestyle Libre 2 Plus Sensors will be able to be a better option for the patient since the other sensors will be discontinuing. He also stated that the new sensors last 15 days which is longer than the previous ones. Because of this change, the scrip will have to be adjusted to reflect a refill for every 15 days.    The patient will be at the pharmacy tomorrow to pick up the old sensors that are going to soon be discontinued and the pharmacist would like to know if the new script could be sent in today so he could pick up the new ones instead.

## 2024-04-09 NOTE — Telephone Encounter (Signed)
 Rx for [employer] 2+ Senor sent to [facility].

## 2024-04-13 ENCOUNTER — Other Ambulatory Visit: Payer: Self-pay | Admitting: Family Medicine

## 2024-04-15 NOTE — Telephone Encounter (Unsigned)
 Copied from CRM #[id number]. Topic: Clinical - Medication Question >> Apr 15, 2024 11:12 AM Berneda FALCON wrote: Reason for CRM: Patient is calling to check on the status of his medication refill for HYDROcodone -acetaminophen  (NORCO) 10-325 MG tablet. He is completely out.  [facility] - [place], [place] - [address], [place] [address] Phone: [phone]  Fax: [phone]  Patient callback is [phone]

## 2024-04-15 NOTE — Telephone Encounter (Addendum)
 Last office visit 03/12/24 for DM and Pain management.  Last refilled: 03/12/24 for #90. (Not on current medication list).  UDS 05/18/23.  Next appt: 06/13/24 for 3 month follow up.

## 2024-04-16 NOTE — Telephone Encounter (Signed)
 Copied from CRM #[id number]. Topic: Clinical - Medication Question >> Apr 16, 2024 10:40 AM Berneda FALCON wrote: Reason for CRM: Patient wants to know the status of his  HYDROcodone -acetaminophen  (NORCO) 10-325 MG tablets.  He has been out since 8/15 and wants to know what is going on.  Patient callback is [phone]

## 2024-04-17 ENCOUNTER — Other Ambulatory Visit: Payer: Self-pay

## 2024-04-17 NOTE — Patient Instructions (Signed)
 Visit Information  Thank you for taking time to visit with me today. Please don't hesitate to contact me if I can be of assistance to you before our next scheduled appointment.  Your next care management appointment is by telephone on 05/15/2024  at 11:00 am  Telephone follow-up in 1 month  Please make appointment with dentist.   Please call the care guide team at [phone] if you need to cancel, schedule, or reschedule an appointment.   Please call the Suicide and Crisis Lifeline: 988 call the [place]  National Suicide Prevention Lifeline: (605)795-5076 or TTY: (401)424-5091 TTY (516)040-3316) to talk to a trained counselor call 1-800-273-TALK (toll free, 24 hour hotline) go to [facility] [address] [place] [phone]) call 911 if you are experiencing a Mental Health or Behavioral Health Crisis or need someone to talk to.   Nestora Duos, MSN, RN [facility], [facility] RN Care Manager Direct Dial: [phone] Fax: [phone]

## 2024-04-17 NOTE — Patient Outreach (Signed)
 Complex Care Management   Visit Note  04/17/2024  Name:  [name] MRN: [id number] DOB: 03/31/1951  Situation: Referral received for Complex Care Management related to Heart Failure, Diabetes with Complications, and Wound, Knee pain I obtained verbal consent from Patient.  Visit completed with Patient  on the phone  Background:   Past Medical History:  Diagnosis Date   Back injury 02/2002   worker's comp   CHF (congestive heart failure) (HCC)    Coronary artery disease, non-occlusive    a. cath 2002 with no sig CAD;  b. cath 2008 normal LM, LAD, LCx, p&dRCA 20-30%, PDA 30%; c.11/2015 NSTEMI/PCI: LM nl, LAD 95p (2.5x15 Xience DES), LCX nl, RCA 100p/m w/ L->R collats, EF 55-65% c. NSTEMI (02/2016) with no culprit leision, switched to Brilinta .  d. NSTEMI 03/2016: again, no culprit lesion and switched back to plavix  2/2 SOB with Brilnta.     Depression    Diabetes mellitus type 2, insulin  dependent (HCC)    Hyperlipemia    Hypertension    Hypertensive heart disease    Kidney stones    Macular degeneration    Morbid obesity (HCC)    Osteoarthritis    Snoring     Assessment: Patient Reported Symptoms:  Cognitive Cognitive Status: No symptoms reported Cognitive/Intellectual Conditions Management [RPT]: None reported or documented in medical history or problem list   Health Maintenance Behaviors: Annual physical exam Healing Pattern: Slow  Neurological   Neurological Management Strategies: Routine screening, Medication therapy Neurological Comment: occasional weakness and numb feet  HEENT HEENT Symptoms Reported: No symptoms reported HEENT Comment: continues with eye injections q 12 Rogue Pautler Vision problem(s), Tooth problem(s)  Cardiovascular Cardiovascular Symptoms Reported: No symptoms reported Does patient have uncontrolled Hypertension?: No Cardiovascular Comment: reviewed symptoms of HF and Afib, denies at this time, aware red flagd for ED and when to call PCP,  reports less  tired since stopping caffeinated soda  Respiratory Respiratory Symptoms Reported: No symptoms reported Respiratory Management Strategies: Routine screening  Endocrine Endocrine Symptoms Reported: No symptoms reported Is patient diabetic?: Yes Is patient checking blood sugars at home?: Yes List most recent blood sugar readings, include date and time of day: FBG today 91, denies highs/lows, ranges 87-140 Endocrine Comment: continues to have snack before bed - no longer having knee surgery due to told risk 50% would not survive  Gastrointestinal Gastrointestinal Symptoms Reported: No symptoms reported      Genitourinary Genitourinary Symptoms Reported: No symptoms reported    Integumentary Additional Integumentary Details: wound care evvery 2 Candus Braud, reports closed and almost gone, QOD dressing changes - no drainage Skin Management Strategies: Routine screening  Musculoskeletal Musculoskelatal Symptoms Reviewed: Difficulty walking, Joint pain, Muscle pain Additional Musculoskeletal Details: knee injection pain now 0-1/10, neck pain at times, stretches, Norco for leg pain Musculoskeletal Management Strategies: Routine screening, Medication therapy Falls in the past year?: Yes Number of falls in past year: 2 or more Was there an injury with Fall?: No Fall Risk Category Calculator: 2 Patient Fall Risk Level: Moderate Fall Risk Patient at Risk for Falls Due to: History of fall(s), Impaired balance/gait, Impaired mobility, Impaired vision, Orthopedic patient Fall risk Follow up: Falls evaluation completed, Falls prevention discussed (Anticoag - ED for head bumps)  Psychosocial Psychosocial Symptoms Reported: No symptoms reported Additional Psychological Details: stets much better since stopped caffeine - sleeps more regular schedule and not getting up as frequently     Quality of Family Relationships: helpful, involved, supportive    04/17/2024  PHQ2-9 Depression Screening   Little interest  or pleasure in doing things Not at all  Feeling down, depressed, or hopeless Not at all  PHQ-2 - Total Score 0  Trouble falling or staying asleep, or sleeping too much    Feeling tired or having little energy    Poor appetite or overeating     Feeling bad about yourself - or that you are a failure or have let yourself or your family down    Trouble concentrating on things, such as reading the newspaper or watching television    Moving or speaking so slowly that other people could have noticed.  Or the opposite - being so fidgety or restless that you have been moving around a lot more than usual    Thoughts that you would be better off dead, or hurting yourself in some way    PHQ2-9 Total Score    If you checked off any problems, how difficult have these problems made it for you to do your work, take care of things at home, or get along with other people    Depression Interventions/Treatment      There were no vitals filed for this visit.  Medications Reviewed Today     Reviewed by Devra Lands, RN (Registered Nurse) on 04/17/24 at 1111  Med List Status: <None>   Medication Order Taking? Sig Documenting Provider Last Dose Status Informant  Acetaminophen  (TYLENOL  PO) [id number] Yes Take 3-4 tablets by mouth as needed (pain). [provider]  Active Child, Pharmacy Records, Self           Med Note JOSEPHINA MORNA LOISE Pablo Jan 02, 2023  2:23 PM)    apixaban  (ELIQUIS ) 5 MG TABS tablet [id number] Yes Take 1 tablet (5 mg total) by mouth 2 (two) times daily. [place] A, FNP  Active   carvedilol  (COREG ) 3.125 MG tablet [id number] Yes Take 1 tablet (3.125 mg total) by mouth 2 (two) times daily. Gerard Frederick, NP  Active   Cholecalciferol  (D-3-5) 125 MCG (5000 UT) capsule [id number] Yes Take 5,000 Units by mouth daily. [provider]  Active   clopidogrel  (PLAVIX ) 75 MG tablet [id number] Yes TAKE ONE TABLET BY MOUTH ONCE DAILY WITH BREAKFAST Bedsole, Amy E, MD  Active    Continuous Glucose Receiver (FREESTYLE LIBRE 2 READER) DEVI [id number] Yes USE WITH SENSORS TO MONITOR SUGAR CONTINUOUSLY Bedsole, Amy E, MD  Active   Continuous Glucose Sensor (FREESTYLE LIBRE 2 PLUS SENSOR) MISC [id number] Yes Change sensor every 15 days. Avelina Greig BRAVO, MD  Active   cyclobenzaprine  (FLEXERIL ) 10 MG tablet [id number] Yes Take 1 tablet (10 mg total) by mouth at bedtime. Bedsole, Amy E, MD  Active   Dulaglutide  (TRULICITY ) 4.5 MG/0.5ML EMMANUEL [id number] Yes INJECT 4.5MG  (0.5ML) UNDER THE SKIN ONCE A WEEK Bedsole, Amy E, MD  Active   empagliflozin  (JARDIANCE ) 25 MG TABS tablet [id number] Yes Take 1 tablet (25 mg total) by mouth daily before breakfast. Avelina Greig BRAVO, MD  Active   ENTRESTO  24-26 MG [id number] Yes TAKE ONE TABLET BY MOUTH TWO TIMES DAILY. [place] A, FNP  Active   ezetimibe  (ZETIA ) 10 MG tablet [id number] Yes Take 1 tablet (10 mg total) by mouth daily. Gollan, Timothy J, MD  Active   furosemide  (LASIX ) 40 MG tablet [id number] Yes TAKE ONE TABLET (40 MG TOTAL) BY MOUTH DAILY. Avelina Greig BRAVO, MD  Active   gabapentin  (NEURONTIN ) 100 MG capsule [id number] Yes TAKE ONE CAPSULE (100 MG TOTAL) BY  MOUTH AT BEDTIME. Avelina Greig BRAVO, MD  Active   HYDROcodone -acetaminophen  (NORCO) 10-325 MG tablet [id number] Yes Take 0.5-1 tablets by mouth every 8 (eight) hours as needed. Avelina Greig BRAVO, MD  Active   insulin  aspart (NOVOLOG  FLEXPEN) 100 UNIT/ML FlexPen [id number] Yes INJECT 13 UNITS UNDER THE SKIN EVERY MORNING AND THREE UNITS AS NEEDED IN THE EVENING. Avelina Greig BRAVO, MD  Active            Med Note DARBY, ROBIN R   Thu Jan 25, 2024  7:23 AM) 65 units this AM  isosorbide  mononitrate (IMDUR ) 30 MG 24 hr tablet [id number] Yes TAKE ONE TABLET BY MOUTH TWICE DAILY Hammock, Sheri, NP  Active   nitroGLYCERIN  (NITROSTAT ) 0.4 MG SL tablet [id number] Yes DISSOLVE 1 TABLET UNDER TONGUE AS NEEDEDFOR CHEST PAIN. MAY REPEAT 5 MINUTES APART 3 TIMES IF NEEDED Hammock, Sheri, NP  Active Self   spironolactone  (ALDACTONE ) 25 MG tablet [id number] Yes TAKE ONE TABLET (25 MG TOTAL) BY MOUTH DAILY. Donette Ellouise LABOR, [place]  Active   TRESIBA  FLEXTOUCH 100 UNIT/ML FlexTouch Pen [id number] Yes INJECT 50 UNITS INTO THE SKIN DAILY. Avelina Greig BRAVO, MD  Active   TRUEPLUS 5-BEVEL PEN NEEDLES 31G X 6 MM MISC [id number] Yes USE TO INJECT INSULIN  THREE TIMES A DAY Bedsole, Amy E, MD  Active   venlafaxine  XR (EFFEXOR -XR) 150 MG 24 hr capsule [id number] Yes TAKE ONE CAPSULE BY MOUTH DAILY WITH BREAKFAST. TAKE WITH EFFEXOR  XR 75MG  FOR A TOTAL OF 225MG  Bedsole, Amy E, MD  Active   venlafaxine  XR (EFFEXOR -XR) 75 MG 24 hr capsule [id number] Yes TAKE ONE CAPSULE BY MOUTH ONCE DAILY. TAKE IN ADDITION TO THE 150 MG CAPSULE FOR A TOTAL DOSE OF 225MG  DAILY Bedsole, Amy E, MD  Active   vitamin B-12 (CYANOCOBALAMIN ) 1000 MCG tablet [id number] Yes Take 1,000 mcg by mouth daily. [provider]  Active Child, Pharmacy Records            Recommendation:   PCP Follow-up Continue Current Plan of Care  Follow Up Plan:   Telephone follow-up in 1 month  Nestora Duos, MSN, RN [facility]  [facility], [facility] RN Care Manager Direct Dial: [phone] Fax: [phone]

## 2024-04-23 ENCOUNTER — Telehealth: Payer: Self-pay | Admitting: Family

## 2024-04-23 ENCOUNTER — Ambulatory Visit: Admitting: Internal Medicine

## 2024-04-23 NOTE — Telephone Encounter (Signed)
 Called to confirm/remind patient of their appointment at the [facility] on 04/24/24.   Appointment:   [] Confirmed  [x] Left mess   [] No answer/No voice mail  [] VM Full/unable to leave message  [] Phone not in service  Patient reminded to bring all medications and/or complete list.  Confirmed patient has transportation. Gave directions, instructed to utilize valet parking.

## 2024-04-24 ENCOUNTER — Encounter: Payer: Self-pay | Admitting: Family

## 2024-04-24 ENCOUNTER — Ambulatory Visit: Payer: PPO | Attending: Family | Admitting: Family

## 2024-04-24 VITALS — BP 116/53 | HR 81 | Wt 266.0 lb

## 2024-04-24 DIAGNOSIS — I2510 Atherosclerotic heart disease of native coronary artery without angina pectoris: Secondary | ICD-10-CM | POA: Diagnosis not present

## 2024-04-24 DIAGNOSIS — I5020 Unspecified systolic (congestive) heart failure: Secondary | ICD-10-CM

## 2024-04-24 DIAGNOSIS — R0602 Shortness of breath: Secondary | ICD-10-CM | POA: Diagnosis present

## 2024-04-24 DIAGNOSIS — Z955 Presence of coronary angioplasty implant and graft: Secondary | ICD-10-CM | POA: Diagnosis not present

## 2024-04-24 DIAGNOSIS — Z7902 Long term (current) use of antithrombotics/antiplatelets: Secondary | ICD-10-CM | POA: Insufficient documentation

## 2024-04-24 DIAGNOSIS — I480 Paroxysmal atrial fibrillation: Secondary | ICD-10-CM | POA: Diagnosis not present

## 2024-04-24 DIAGNOSIS — I10 Essential (primary) hypertension: Secondary | ICD-10-CM | POA: Diagnosis not present

## 2024-04-24 DIAGNOSIS — I739 Peripheral vascular disease, unspecified: Secondary | ICD-10-CM | POA: Diagnosis not present

## 2024-04-24 DIAGNOSIS — Z7984 Long term (current) use of oral hypoglycemic drugs: Secondary | ICD-10-CM | POA: Diagnosis not present

## 2024-04-24 DIAGNOSIS — I255 Ischemic cardiomyopathy: Secondary | ICD-10-CM | POA: Diagnosis not present

## 2024-04-24 DIAGNOSIS — E1151 Type 2 diabetes mellitus with diabetic peripheral angiopathy without gangrene: Secondary | ICD-10-CM | POA: Insufficient documentation

## 2024-04-24 DIAGNOSIS — I252 Old myocardial infarction: Secondary | ICD-10-CM | POA: Diagnosis not present

## 2024-04-24 DIAGNOSIS — N189 Chronic kidney disease, unspecified: Secondary | ICD-10-CM | POA: Diagnosis not present

## 2024-04-24 DIAGNOSIS — E785 Hyperlipidemia, unspecified: Secondary | ICD-10-CM | POA: Diagnosis not present

## 2024-04-24 DIAGNOSIS — E1122 Type 2 diabetes mellitus with diabetic chronic kidney disease: Secondary | ICD-10-CM | POA: Insufficient documentation

## 2024-04-24 DIAGNOSIS — R5383 Other fatigue: Secondary | ICD-10-CM

## 2024-04-24 DIAGNOSIS — I130 Hypertensive heart and chronic kidney disease with heart failure and stage 1 through stage 4 chronic kidney disease, or unspecified chronic kidney disease: Secondary | ICD-10-CM | POA: Insufficient documentation

## 2024-04-24 DIAGNOSIS — Z794 Long term (current) use of insulin: Secondary | ICD-10-CM

## 2024-04-24 DIAGNOSIS — F32A Depression, unspecified: Secondary | ICD-10-CM | POA: Insufficient documentation

## 2024-04-24 DIAGNOSIS — I5022 Chronic systolic (congestive) heart failure: Secondary | ICD-10-CM | POA: Insufficient documentation

## 2024-04-24 DIAGNOSIS — E11319 Type 2 diabetes mellitus with unspecified diabetic retinopathy without macular edema: Secondary | ICD-10-CM
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
## 2024-04-24 NOTE — Patient Instructions (Addendum)
 Medication Changes:  No medication changes today!   Testing/Procedures:  Your physician has requested that you have an echocardiogram. Echocardiography is a painless test that uses sound waves to create images of your heart. It provides your doctor with information about the size and shape of your heart and how well your heart's chambers and valves are working. This procedure takes approximately one hour. There are no restrictions for this procedure. Please do NOT wear cologne, perfume, aftershave, or lotions (deodorant is allowed). Please arrive 15 minutes prior to your appointment time.  Please note: We ask at that you not bring children with you during ultrasound (echo/ vascular) testing. Due to room size and safety concerns, children are not allowed in the ultrasound rooms during exams. Our front office staff cannot provide observation of children in our lobby area while testing is being conducted. An adult accompanying a patient to their appointment will only be allowed in the ultrasound room at the discretion of the ultrasound technician under special circumstances. We apologize for any inconvenience.  This will be done at the [facility] office in [place]. This office is located on the Lower Level of this building.   Follow-Up in: Please follow up with the [facility] in 2 months with Dr. Zenaida. We do not have that schedule right now. Please give us  a call in Late September in order to schedule your appointment for October.    Thank you for choosing [facility].    At the [facility], you and your health needs are our priority. We have a designated team specialized in the treatment of Heart Failure. This Care Team includes your primary Heart Failure Specialized Cardiologist (physician), Advanced Practice Providers (APPs- Physician Assistants and Nurse Practitioners), and Pharmacist who all work together to  provide you with the care you need, when you need it.   You may see any of the following providers on your designated Care Team at your next follow up:  Dr. Toribio Fuel Dr. Ezra Shuck Dr. Ria Commander Dr. Morene Zenaida Ellouise Class, FNP Jaun Bash, RPH-CPP  Please be sure to bring in all your medications bottles to every appointment.   Need to Contact Us :  If you have any questions or concerns before your next appointment please send us  a message through [place] or call our office at [phone].    TO LEAVE A MESSAGE FOR THE NURSE SELECT OPTION 2, PLEASE LEAVE A MESSAGE INCLUDING: YOUR NAME DATE OF BIRTH CALL BACK NUMBER REASON FOR CALL**this is important as we prioritize the call backs  YOU WILL RECEIVE A CALL BACK THE SAME DAY AS LONG AS YOU CALL BEFORE 4:00 PM

## 2024-05-08 ENCOUNTER — Ambulatory Visit: Admitting: Family Medicine

## 2024-05-08 ENCOUNTER — Encounter: Payer: Self-pay | Admitting: Family Medicine

## 2024-05-08 ENCOUNTER — Telehealth: Payer: Self-pay

## 2024-05-08 VITALS — BP 122/62 | HR 78 | Temp 97.8°F | Ht 66.0 in | Wt 264.6 lb

## 2024-05-08 DIAGNOSIS — R350 Frequency of micturition: Secondary | ICD-10-CM | POA: Diagnosis not present

## 2024-05-08 LAB — POC URINALSYSI DIPSTICK (AUTOMATED)
Bilirubin, UA: NEGATIVE
Glucose, UA: POSITIVE — AB
Ketones, UA: NEGATIVE
Nitrite, UA: NEGATIVE
Protein, UA: POSITIVE — AB
Spec Grav, UA: 1.015 (ref 1.010–1.025)
Urobilinogen, UA: NEGATIVE U/dL — AB
pH, UA: 5.5 (ref 5.0–8.0)

## 2024-05-08 NOTE — Telephone Encounter (Signed)
 Copied from CRM [id number]. Topic: Clinical - Prescription Issue >> May 08, 2024  1:19 PM Taleah C wrote: Reason for CRM: pt called and stated that after his visit today with pcp, he was supposed to get a rx to treat his kidney infection. His pharmacy is [facility]. Pt is expecting a call back before EOD.

## 2024-05-08 NOTE — Progress Notes (Signed)
 Patient ID: [name], male    DOB: 1951/06/22, 73 y.o.   MRN: [id number]  This visit was conducted in person.  BP 122/62   Pulse 78   Temp 97.8 F (36.6 C) (Oral)   Ht 5' 6 (1.676 m)   Wt 264 lb 9.6 oz (120 kg)   SpO2 98%   BMI 42.71 kg/m    CC:  Chief Complaint  Patient presents with   Urinary Frequency    Ongoing for about 2 weeks. Sometimes he states that it hurts. No odor    Subjective:   HPI: [name] is a 73 y.o. male with history of diabetes on SGLT2 medication presenting on 05/08/2024 for Urinary Frequency (Ongoing for about 2 weeks. Sometimes he states that it hurts. No odor)  He has noted 2 weeks of urinary frequency, and occasional dysuria.  No urine odor.  Notes urinary urgency. Frequency incontinence.  Stopped fluid pills given going several times a hour.  Good flow, empties completely.  Occ lower abdominal pain.    Pain in penis as urine comes through.  No CVA tenderness.   No  past UTI.   Started after drinking more sugar.SABRA got wrong tea, sweetened.  FBS 300  Now back to normal on regular tea.       Lab Results  Component Value Date   PSA1 0.4 06/10/2022   PSA 0.30 09/05/2023   PSA 0.27 07/31/2020   PSA 0.30 03/19/2019      Relevant past medical, surgical, family and social history reviewed and updated as indicated. Interim medical history since our last visit reviewed. Allergies and medications reviewed and updated. Outpatient Medications Prior to Visit  Medication Sig Dispense Refill   Acetaminophen  (TYLENOL  PO) Take 3-4 tablets by mouth as needed (pain).     apixaban  (ELIQUIS ) 5 MG TABS tablet Take 1 tablet (5 mg total) by mouth 2 (two) times daily. 60 tablet 5   carvedilol  (COREG ) 3.125 MG tablet Take 1 tablet (3.125 mg total) by mouth 2 (two) times daily. 180 tablet 3   Cholecalciferol  (D-3-5) 125 MCG (5000 UT) capsule Take 5,000 Units by mouth daily.     clopidogrel  (PLAVIX ) 75 MG tablet TAKE ONE TABLET BY MOUTH ONCE  DAILY WITH BREAKFAST 90 tablet 3   Continuous Glucose Receiver (FREESTYLE LIBRE 2 READER) DEVI USE WITH SENSORS TO MONITOR SUGAR CONTINUOUSLY 1 each 0   Continuous Glucose Sensor (FREESTYLE LIBRE 2 PLUS SENSOR) MISC Change sensor every 15 days. 6 each 3   cyclobenzaprine  (FLEXERIL ) 10 MG tablet Take 1 tablet (10 mg total) by mouth at bedtime. 30 tablet 0   Dulaglutide  (TRULICITY ) 4.5 MG/0.5ML SOAJ INJECT 4.5MG  (0.5ML) UNDER THE SKIN ONCE A WEEK 6 mL 3   empagliflozin  (JARDIANCE ) 25 MG TABS tablet Take 1 tablet (25 mg total) by mouth daily before breakfast. 30 tablet 11   ENTRESTO  24-26 MG TAKE ONE TABLET BY MOUTH TWO TIMES DAILY. 60 tablet 3   ezetimibe  (ZETIA ) 10 MG tablet Take 1 tablet (10 mg total) by mouth daily. 90 tablet 3   furosemide  (LASIX ) 40 MG tablet TAKE ONE TABLET (40 MG TOTAL) BY MOUTH DAILY. 90 tablet 1   gabapentin  (NEURONTIN ) 100 MG capsule TAKE ONE CAPSULE (100 MG TOTAL) BY MOUTH AT BEDTIME. 30 capsule 3   HYDROcodone -acetaminophen  (NORCO) 10-325 MG tablet Take 0.5-1 tablets by mouth every 8 (eight) hours as needed. 90 tablet 0   insulin  aspart (NOVOLOG  FLEXPEN) 100 UNIT/ML FlexPen INJECT 13 UNITS  UNDER THE SKIN EVERY MORNING AND THREE UNITS AS NEEDED IN THE EVENING. 15 mL 2   isosorbide  mononitrate (IMDUR ) 30 MG 24 hr tablet TAKE ONE TABLET BY MOUTH TWICE DAILY 180 tablet 3   nitroGLYCERIN  (NITROSTAT ) 0.4 MG SL tablet DISSOLVE 1 TABLET UNDER TONGUE AS NEEDEDFOR CHEST PAIN. MAY REPEAT 5 MINUTES APART 3 TIMES IF NEEDED 25 tablet 3   spironolactone  (ALDACTONE ) 25 MG tablet TAKE ONE TABLET (25 MG TOTAL) BY MOUTH DAILY. 30 tablet 5   TRESIBA  FLEXTOUCH 100 UNIT/ML FlexTouch Pen INJECT 50 UNITS INTO THE SKIN DAILY. 15 mL 2   TRUEPLUS 5-BEVEL PEN NEEDLES 31G X 6 MM MISC USE TO INJECT INSULIN  THREE TIMES A DAY 300 each 3   venlafaxine  XR (EFFEXOR -XR) 150 MG 24 hr capsule TAKE ONE CAPSULE BY MOUTH DAILY WITH BREAKFAST. TAKE WITH EFFEXOR  XR 75MG  FOR A TOTAL OF 225MG  90 capsule 1    venlafaxine  XR (EFFEXOR -XR) 75 MG 24 hr capsule TAKE ONE CAPSULE BY MOUTH ONCE DAILY. TAKE IN ADDITION TO THE 150 MG CAPSULE FOR A TOTAL DOSE OF 225MG  DAILY 90 capsule 1   vitamin B-12 (CYANOCOBALAMIN ) 1000 MCG tablet Take 1,000 mcg by mouth daily.     No facility-administered medications prior to visit.     Per HPI unless specifically indicated in ROS section below Review of Systems Objective:  BP 122/62   Pulse 78   Temp 97.8 F (36.6 C) (Oral)   Ht 5' 6 (1.676 m)   Wt 264 lb 9.6 oz (120 kg)   SpO2 98%   BMI 42.71 kg/m   Wt Readings from Last 3 Encounters:  05/08/24 264 lb 9.6 oz (120 kg)  04/24/24 266 lb (120.7 kg)  03/20/24 262 lb (118.8 kg)      Physical Exam    Results for orders placed or performed in visit on 05/08/24  POCT Urinalysis Dipstick (Automated)   Collection Time: 05/08/24 10:38 AM  Result Value Ref Range   Color, UA Light Yellow    Clarity, UA CLOUDY    Glucose, UA Positive (A) Negative   Bilirubin, UA NEGATIVE    Ketones, UA NEGATIVE    Spec Grav, UA 1.015 1.010 - 1.025   Blood, UA 2+    pH, UA 5.5 5.0 - 8.0   Protein, UA Positive (A) Negative   Urobilinogen, UA negative (A) 0.2 or 1.0 E.U./dL   Nitrite, UA NEGATIVE    Leukocytes, UA Moderate (2+) (A) Negative   *Note: Due to a large number of results and/or encounters for the requested time period, some results have not been displayed. A complete set of results can be found in Results Review.    Assessment and Plan  Urinary frequency Assessment & Plan:  Acute, Urinalysis concerning for likely acute cystitis with hematuria. Likely occurring given patient on SGLT2 inhibitor with recent worsening of blood sugar control. Recommended continuing Jardiance  given additional benefit and this is patient's first urinary tract infection. No evidence of BPH as good flow. No sign of pyelonephritis.  No red flags  Will treat with cephalexin 500 mg p.o. 3 times daily x 7 days.  Push fluids.  Send urine  for culture.  Return and ER precautions provided.  Orders: -     POCT Urinalysis Dipstick (Automated)    No follow-ups on file.   Greig Ring, MD

## 2024-05-08 NOTE — Addendum Note (Signed)
 Addended by: HOPE VEVA PARAS on: 05/08/2024 01:08 PM   Modules accepted: Orders

## 2024-05-08 NOTE — Assessment & Plan Note (Signed)
 Acute, Urinalysis concerning for likely acute cystitis with hematuria. Likely occurring given patient on SGLT2 inhibitor with recent worsening of blood sugar control. Recommended continuing Jardiance  given additional benefit and this is patient's first urinary tract infection. No evidence of BPH as good flow. No sign of pyelonephritis.  No red flags  Will treat with cephalexin 500 mg p.o. 3 times daily x 7 days.  Push fluids.  Send urine for culture.  Return and ER precautions provided.

## 2024-05-09 ENCOUNTER — Other Ambulatory Visit: Payer: Self-pay | Admitting: Family

## 2024-05-09 LAB — URINE CULTURE
MICRO NUMBER:: 16949290
SPECIMEN QUALITY:: ADEQUATE

## 2024-05-09 MED ORDER — CIPROFLOXACIN HCL 500 MG PO TABS: 500.0000 mg | ORAL_TABLET | Freq: Three times a day (TID) | ORAL | 0 refills | Status: DC

## 2024-05-09 NOTE — Telephone Encounter (Signed)
 Per office note Dr. Avelina wanted cephalexin 500 mg p.o. 3 times daily x 7 days sent in. Verified with her and sent in as requested. Patient informed by Leita on phone call to patient that he should be able to pick up in one hour.

## 2024-05-10 ENCOUNTER — Ambulatory Visit: Payer: Self-pay | Admitting: Family Medicine

## 2024-05-13 DIAGNOSIS — M1711 Unilateral primary osteoarthritis, right knee: Secondary | ICD-10-CM | POA: Diagnosis not present

## 2024-05-14 ENCOUNTER — Other Ambulatory Visit (HOSPITAL_COMMUNITY): Payer: Self-pay

## 2024-05-14 ENCOUNTER — Other Ambulatory Visit: Payer: Self-pay | Admitting: Family Medicine

## 2024-05-14 ENCOUNTER — Telehealth: Payer: Self-pay

## 2024-05-14 DIAGNOSIS — M545 Low back pain, unspecified: Secondary | ICD-10-CM

## 2024-05-14 NOTE — Telephone Encounter (Signed)
 Pharmacy Patient Advocate Encounter   Received notification from CoverMyMeds that prior authorization for Cyclobenzaprine  HCl 10MG  tablets  is due for renewal.   Insurance verification completed.   The patient is insured through [facility] ADVANTAGE/RX ADVANCE.  Action: PA required; PA submitted to above mentioned insurance via Latent Key/confirmation #/EOC [id number] Status is pending

## 2024-05-14 NOTE — Telephone Encounter (Signed)
 Last office visit 05/08/24 for Urinary Frequency.  Last refilled Norco 04/16/2024 for #30 with no refills. Flexeril  03/12/2024 for #30 with no refills.  Next Appt: 06/13/24.  UDS/Contract 05/18/23.

## 2024-05-15 ENCOUNTER — Other Ambulatory Visit (HOSPITAL_COMMUNITY): Payer: Self-pay

## 2024-05-15 ENCOUNTER — Telehealth: Payer: Self-pay

## 2024-05-15 NOTE — Patient Instructions (Signed)
 [name] - I am sorry I was unable to reach you today for our scheduled appointment. I work with Avelina Greig BRAVO, MD and am calling to support your healthcare needs. Please contact me at [phone] at your earliest convenience. I look forward to speaking with you soon.   Thank you,  Nestora Duos, MSN, RN [facility]  [facility], [facility] RN Care Manager Direct Dial: [phone] Fax: [phone]

## 2024-05-22 ENCOUNTER — Other Ambulatory Visit (HOSPITAL_COMMUNITY): Payer: Self-pay

## 2024-05-22 NOTE — Telephone Encounter (Signed)
 MyChart message sent to pt

## 2024-05-22 NOTE — Telephone Encounter (Signed)
 Pharmacy Patient Advocate Encounter  Received notification from [facility] ADVANTAGE/RX ADVANCE that Prior Authorization for Cyclobenzaprine  HCl 10MG  tablets has been APPROVED from 05/14/2024 to 06/11/2024   PA #/Case ID/Reference #: [id number]

## 2024-05-28 ENCOUNTER — Telehealth: Payer: Self-pay

## 2024-05-28 ENCOUNTER — Other Ambulatory Visit: Payer: Self-pay | Admitting: Family Medicine

## 2024-05-28 NOTE — Telephone Encounter (Signed)
 Copied from CRM #[id number]. Topic: Clinical - Medication Question >> May 28, 2024 11:20 AM Anairis L wrote: Reason for CRM: Patient is returning  Wendell Arland RAMAN, CMA call regarding medication.   Please call pt back.

## 2024-05-28 NOTE — Telephone Encounter (Signed)
 Last office visit 05/08/2024 for Urinary frequency.  Last refilled 05/14/2024 for #30 with no refills.  Next Appt: 06/13/2024 for 3 month follow up.

## 2024-05-28 NOTE — Telephone Encounter (Signed)
 Devra Lands, RN called patient today.  Will forward message to her to return call to Mr. Delrosario.

## 2024-05-29 ENCOUNTER — Other Ambulatory Visit (INDEPENDENT_AMBULATORY_CARE_PROVIDER_SITE_OTHER): Payer: Self-pay | Admitting: Nurse Practitioner

## 2024-05-29 DIAGNOSIS — Z9889 Other specified postprocedural states: Secondary | ICD-10-CM

## 2024-05-30 ENCOUNTER — Ambulatory Visit: Attending: Cardiology | Admitting: Cardiology

## 2024-05-30 ENCOUNTER — Encounter (INDEPENDENT_AMBULATORY_CARE_PROVIDER_SITE_OTHER): Payer: Self-pay | Admitting: Nurse Practitioner

## 2024-05-30 ENCOUNTER — Ambulatory Visit (INDEPENDENT_AMBULATORY_CARE_PROVIDER_SITE_OTHER): Admitting: Nurse Practitioner

## 2024-05-30 ENCOUNTER — Ambulatory Visit (INDEPENDENT_AMBULATORY_CARE_PROVIDER_SITE_OTHER)

## 2024-05-30 ENCOUNTER — Other Ambulatory Visit: Payer: Self-pay

## 2024-05-30 ENCOUNTER — Encounter: Payer: Self-pay | Admitting: Cardiology

## 2024-05-30 ENCOUNTER — Other Ambulatory Visit (INDEPENDENT_AMBULATORY_CARE_PROVIDER_SITE_OTHER)

## 2024-05-30 VITALS — BP 124/68 | HR 80 | Ht 66.0 in | Wt 266.8 lb

## 2024-05-30 VITALS — BP 128/78 | HR 79 | Resp 18 | Ht 66.0 in | Wt 265.4 lb

## 2024-05-30 DIAGNOSIS — E785 Hyperlipidemia, unspecified: Secondary | ICD-10-CM

## 2024-05-30 DIAGNOSIS — Z9889 Other specified postprocedural states: Secondary | ICD-10-CM

## 2024-05-30 DIAGNOSIS — I739 Peripheral vascular disease, unspecified: Secondary | ICD-10-CM

## 2024-05-30 DIAGNOSIS — I152 Hypertension secondary to endocrine disorders: Secondary | ICD-10-CM

## 2024-05-30 DIAGNOSIS — I2510 Atherosclerotic heart disease of native coronary artery without angina pectoris: Secondary | ICD-10-CM | POA: Diagnosis not present

## 2024-05-30 DIAGNOSIS — E11319 Type 2 diabetes mellitus with unspecified diabetic retinopathy without macular edema: Secondary | ICD-10-CM

## 2024-05-30 DIAGNOSIS — Z794 Long term (current) use of insulin: Secondary | ICD-10-CM

## 2024-05-30 DIAGNOSIS — I10 Essential (primary) hypertension: Secondary | ICD-10-CM | POA: Diagnosis not present

## 2024-05-30 DIAGNOSIS — E1159 Type 2 diabetes mellitus with other circulatory complications: Secondary | ICD-10-CM | POA: Diagnosis not present

## 2024-05-30 DIAGNOSIS — I480 Paroxysmal atrial fibrillation: Secondary | ICD-10-CM | POA: Diagnosis not present

## 2024-05-30 DIAGNOSIS — I5020 Unspecified systolic (congestive) heart failure: Secondary | ICD-10-CM | POA: Diagnosis not present

## 2024-05-30 DIAGNOSIS — E1169 Type 2 diabetes mellitus with other specified complication: Secondary | ICD-10-CM

## 2024-05-30 MED ORDER — EZETIMIBE 10 MG PO TABS: 10.0000 mg | ORAL_TABLET | Freq: Every day | ORAL | 3 refills | Status: AC
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
## 2024-05-30 NOTE — Patient Instructions (Signed)
 Medication Instructions:  Your physician recommends that you continue on your current medications as directed. Please refer to the Current Medication list given to you today.  *If you need a refill on your cardiac medications before your next appointment, please call your pharmacy*  Lab Work: No labs ordered today  If you have labs (blood work) drawn today and your tests are completely normal, you will receive your results only by: MyChart Message (if you have MyChart) OR A paper copy in the mail If you have any lab test that is abnormal or we need to change your treatment, we will call you to review the results.  Testing/Procedures: No test ordered today   Follow-Up: At [facility], you and your health needs are our priority.  As part of our continuing mission to provide you with exceptional heart care, our providers are all part of one team.  This team includes your primary Cardiologist (physician) and Advanced Practice Providers or APPs (Physician Assistants and Nurse Practitioners) who all work together to provide you with the care you need, when you need it.  Your next appointment:   6 month(s)  Provider:   You may see Julien Nordmann, MD or one of the following Advanced Practice Providers on your designated Care Team:   Nicolasa Ducking, NP Ames Dura, PA-C Eula Listen, PA-C Cadence Luther, PA-C Charlsie Quest, NP Carlos Levering, NP

## 2024-06-03 LAB — VAS US ABI WITH/WO TBI
Left ABI: 1.04
Right ABI: 1.11

## 2024-06-05 ENCOUNTER — Ambulatory Visit: Attending: Family

## 2024-06-05 DIAGNOSIS — I5020 Unspecified systolic (congestive) heart failure: Secondary | ICD-10-CM

## 2024-06-05 LAB — ECHOCARDIOGRAM COMPLETE
AR max vel: 2.76 cm2
AV Area VTI: 2.51 cm2
AV Area mean vel: 2.77 cm2
AV Mean grad: 4 mmHg
AV Peak grad: 7.1 mmHg
Ao pk vel: 1.33 m/s
Area-P 1/2: 3.17 cm2
S' Lateral: 3.1 cm

## 2024-06-05 MED ORDER — PERFLUTREN LIPID MICROSPHERE
1.0000 mL | INTRAVENOUS | Status: AC | PRN
  Administered 2024-06-05: 3 mL via INTRAVENOUS

## 2024-06-06 ENCOUNTER — Ambulatory Visit: Payer: Self-pay | Admitting: Family

## 2024-06-06 ENCOUNTER — Other Ambulatory Visit: Payer: Self-pay

## 2024-06-06 NOTE — Patient Outreach (Signed)
 Complex Care Management   Visit Note  06/06/2024  Name:  [name] MRN: [id number] DOB: 1950-10-19  Situation: Referral received for Complex Care Management related to Heart Failure and Diabetes with Complications I obtained verbal consent from Patient.  Visit completed with Patient  on the phone  Background:   Past Medical History:  Diagnosis Date   Back injury 02/2002   worker's comp   CHF (congestive heart failure) (HCC)    Coronary artery disease, non-occlusive    a. cath 2002 with no sig CAD;  b. cath 2008 normal LM, LAD, LCx, p&dRCA 20-30%, PDA 30%; c.11/2015 NSTEMI/PCI: LM nl, LAD 95p (2.5x15 Xience DES), LCX nl, RCA 100p/m w/ L->R collats, EF 55-65% c. NSTEMI (02/2016) with no culprit leision, switched to Brilinta .  d. NSTEMI 03/2016: again, no culprit lesion and switched back to plavix  2/2 SOB with Brilnta.     Depression    Diabetes mellitus type 2, insulin  dependent (HCC)    Hyperlipemia    Hypertension    Hypertensive heart disease    Kidney stones    Macular degeneration    Morbid obesity (HCC)    Osteoarthritis    Snoring     Assessment: Patient Reported Symptoms:  Cognitive Cognitive Status: No symptoms reported Cognitive/Intellectual Conditions Management [RPT]: None reported or documented in medical history or problem list   Health Maintenance Behaviors: Annual physical exam  Neurological Neurological Review of Symptoms: Numbness Neurological Management Strategies: Medication therapy, Routine screening Neurological Comment: neuropathy  HEENT HEENT Symptoms Reported: No symptoms reported HEENT Management Strategies: Routine screening, Medication therapy    Cardiovascular Cardiovascular Symptoms Reported: Fatigue Does patient have uncontrolled Hypertension?: No Cardiovascular Management Strategies: Diet modification, Medication therapy, Routine screening Cardiovascular Comment: denies sx of Afib/HF at this time, saw cardiology recently re fatigue ad DOE -  persistent afib,  Respiratory Respiratory Symptoms Reported: Shortness of breath Other Respiratory Symptoms: DOE unchanged Respiratory Management Strategies: Routine screening  Endocrine Endocrine Symptoms Reported: No symptoms reported Is patient diabetic?: Yes Is patient checking blood sugars at home?: Yes List most recent blood sugar readings, include date and time of day: FBG - reports ranging 85-95 no lows/highs    Gastrointestinal Gastrointestinal Symptoms Reported: Not assessed      Genitourinary Genitourinary Symptoms Reported: No symptoms reported Additional Genitourinary Details: UTI sx resolved - completed abx as prescribed Genitourinary Management Strategies: Medication therapy  Integumentary Integumentary Symptoms Reported: No symptoms reported Additional Integumentary Details: denies any wounds/open areas - all healed Skin Management Strategies: Routine screening  Musculoskeletal Musculoskelatal Symptoms Reviewed: Difficulty walking, Joint pain, Muscle pain, Limited mobility Additional Musculoskeletal Details: wearing knee brace Musculoskeletal Management Strategies: Medication therapy, Routine screening, Medical device Musculoskeletal Comment: no new falls Falls in the past year?: Yes Number of falls in past year: 2 or more Was there an injury with Fall?: No Fall Risk Category Calculator: 2 Patient Fall Risk Level: Moderate Fall Risk Patient at Risk for Falls Due to: History of fall(s), Impaired balance/gait, Impaired mobility, Impaired vision, Orthopedic patient Fall risk Follow up: Falls evaluation completed, Falls prevention discussed  Psychosocial Psychosocial Symptoms Reported: No symptoms reported          06/06/2024    PHQ2-9 Depression Screening   Little interest or pleasure in doing things Not at all  Feeling down, depressed, or hopeless Not at all  PHQ-2 - Total Score 0  Trouble falling or staying asleep, or sleeping too much    Feeling tired or  having little energy  Poor appetite or overeating     Feeling bad about yourself - or that you are a failure or have let yourself or your family down    Trouble concentrating on things, such as reading the newspaper or watching television    Moving or speaking so slowly that other people could have noticed.  Or the opposite - being so fidgety or restless that you have been moving around a lot more than usual    Thoughts that you would be better off dead, or hurting yourself in some way    PHQ2-9 Total Score    If you checked off any problems, how difficult have these problems made it for you to do your work, take care of things at home, or get along with other people    Depression Interventions/Treatment      There were no vitals filed for this visit.  Medications Reviewed Today     Reviewed by Devra Lands, RN (Registered Nurse) on 06/06/24 at 1111  Med List Status: <None>   Medication Order Taking? Sig Documenting Provider Last Dose Status Informant  Acetaminophen  (TYLENOL  PO) [id number] Yes Take 3-4 tablets by mouth as needed (pain). [provider]  Active Child, Pharmacy Records, Self           Med Note JOSEPHINA MORNA LOISE Pablo Jan 02, 2023  2:23 PM)    apixaban  (ELIQUIS ) 5 MG TABS tablet [id number] Yes Take 1 tablet (5 mg total) by mouth 2 (two) times daily. [place] A, FNP  Active   carvedilol  (COREG ) 3.125 MG tablet [id number] Yes Take 1 tablet (3.125 mg total) by mouth 2 (two) times daily. Gerard Frederick, NP  Active   Cholecalciferol  (D-3-5) 125 MCG (5000 UT) capsule [id number] Yes Take 5,000 Units by mouth daily. [provider]  Active   clopidogrel  (PLAVIX ) 75 MG tablet [id number] Yes TAKE ONE TABLET BY MOUTH ONCE DAILY WITH BREAKFAST Bedsole, Amy E, MD  Active   Continuous Glucose Receiver (FREESTYLE LIBRE 2 READER) DEVI [id number] Yes USE WITH SENSORS TO MONITOR SUGAR CONTINUOUSLY Bedsole, Amy E, MD  Active   Continuous Glucose Sensor (FREESTYLE LIBRE  2 PLUS SENSOR) MISC [id number] Yes Change sensor every 15 days. Avelina Greig BRAVO, MD  Active   cyclobenzaprine  (FLEXERIL ) 10 MG tablet [id number]  TAKE ONE TABLET (10 MG TOTAL) BY MOUTH AT BEDTIME.  Patient not taking: Reported on 05/30/2024   Avelina Greig BRAVO, MD  Active   Dulaglutide  (TRULICITY ) 4.5 MG/0.5ML EMMANUEL [id number] Yes INJECT 4.5MG  (0.5ML) UNDER THE SKIN ONCE A WEEK Bedsole, Amy E, MD  Active   empagliflozin  (JARDIANCE ) 25 MG TABS tablet [id number] Yes Take 1 tablet (25 mg total) by mouth daily before breakfast. Avelina Greig BRAVO, MD  Active   ENTRESTO  24-26 MG [id number] Yes TAKE ONE TABLET BY MOUTH TWO TIMES DAILY. [place] A, FNP  Active   ezetimibe  (ZETIA ) 10 MG tablet [id number] Yes Take 1 tablet (10 mg total) by mouth daily. Gerard Frederick, NP  Active   furosemide  (LASIX ) 40 MG tablet [id number] Yes TAKE ONE TABLET (40 MG TOTAL) BY MOUTH DAILY. Avelina Greig BRAVO, MD  Active   gabapentin  (NEURONTIN ) 100 MG capsule [id number] Yes TAKE ONE CAPSULE (100 MG TOTAL) BY MOUTH AT BEDTIME. Avelina Greig BRAVO, MD  Active   HYDROcodone -acetaminophen  (NORCO) 10-325 MG tablet [id number] Yes Take 0.5-1 tablets by mouth every 8 (eight) hours as needed. Bedsole, Amy E, MD  Active   isosorbide  mononitrate (IMDUR ) 30 MG 24 hr tablet  [id number] Yes TAKE ONE TABLET BY MOUTH TWICE DAILY Hammock, Sheri, NP  Active   nitroGLYCERIN  (NITROSTAT ) 0.4 MG SL tablet [id number] Yes DISSOLVE 1 TABLET UNDER TONGUE AS NEEDEDFOR CHEST PAIN. MAY REPEAT 5 MINUTES APART 3 TIMES IF NEEDED Hammock, Sheri, NP  Active Self  NOVOLOG  FLEXPEN 100 UNIT/ML FlexPen [id number] Yes INJECT 13 UNITS UNDER THE SKIN EVERY MORNING AND THREE UNITS AS NEEDED IN THE EVENING. Avelina Greig BRAVO, MD  Active   spironolactone  (ALDACTONE ) 25 MG tablet [id number] Yes TAKE ONE TABLET (25 MG TOTAL) BY MOUTH DAILY. Donette Ellouise LABOR, [place]  Active   TRESIBA  FLEXTOUCH 100 UNIT/ML FlexTouch Pen [id number] Yes INJECT 50 UNITS INTO THE SKIN DAILY. Avelina Greig BRAVO, MD  Active   TRUEPLUS  5-BEVEL PEN NEEDLES 31G X 6 MM MISC [id number] Yes USE TO INJECT INSULIN  THREE TIMES A DAY Bedsole, Amy E, MD  Active   venlafaxine  XR (EFFEXOR -XR) 150 MG 24 hr capsule [id number] Yes TAKE ONE CAPSULE BY MOUTH DAILY WITH BREAKFAST. TAKE WITH EFFEXOR  XR 75MG  FOR A TOTAL OF 225MG  Bedsole, Amy E, MD  Active   venlafaxine  XR (EFFEXOR -XR) 75 MG 24 hr capsule [id number] Yes TAKE ONE CAPSULE BY MOUTH ONCE DAILY. TAKE IN ADDITION TO THE 150 MG CAPSULE FOR A TOTAL DOSE OF 225MG  DAILY Bedsole, Amy E, MD  Active   vitamin B-12 (CYANOCOBALAMIN ) 1000 MCG tablet [id number] Yes Take 1,000 mcg by mouth daily. [provider]  Active Child, Pharmacy Records            Recommendation:   PCP Follow-up Continue Current Plan of Care  Follow Up Plan:   Telephone follow-up 6 Jahaan Vanwagner  Nestora Duos, MSN, RN [facility]  [facility], [facility] RN Care Manager Direct Dial: [phone] Fax: [phone]

## 2024-06-06 NOTE — Patient Instructions (Signed)
 Visit Information  Thank you for taking time to visit with me today. Please don't hesitate to contact me if I can be of assistance to you before our next scheduled appointment.  Your next care management appointment is by telephone on 07/18/2024 at 10:00 am  Telephone follow-up 6 Nova Evett  Please call the care guide team at [phone] if you need to cancel, schedule, or reschedule an appointment.   Please call the Suicide and Crisis Lifeline: 988 call the [place]  National Suicide Prevention Lifeline: (430)610-1440 or TTY: 856-033-0121 TTY (352)426-8577) to talk to a trained counselor call 1-800-273-TALK (toll free, 24 hour hotline) go to [facility] [address] [place] [phone]) call 911 if you are experiencing a Mental Health or Behavioral Health Crisis or need someone to talk to.  Nestora Duos, MSN, RN [facility], [facility] RN Care Manager Direct Dial: [phone] Fax: [phone]

## 2024-06-10 ENCOUNTER — Other Ambulatory Visit: Payer: Self-pay | Admitting: Family Medicine

## 2024-06-10 ENCOUNTER — Encounter: Payer: Self-pay | Admitting: Family Medicine

## 2024-06-10 DIAGNOSIS — M545 Low back pain, unspecified: Secondary | ICD-10-CM

## 2024-06-10 NOTE — Telephone Encounter (Signed)
 Last office visit 05/08/2024 for Urinary Frequency.  Last refilled 05/14/2024 for #90 with no refills.  Next Appt: 06/13/2024 for 3 month follow up.   UDS/Contract:  05/18/2023

## 2024-06-13 ENCOUNTER — Ambulatory Visit: Admitting: Family Medicine

## 2024-06-18 ENCOUNTER — Ambulatory Visit (INDEPENDENT_AMBULATORY_CARE_PROVIDER_SITE_OTHER): Admitting: Family Medicine

## 2024-06-18 ENCOUNTER — Encounter: Payer: Self-pay | Admitting: Family Medicine

## 2024-06-18 VITALS — BP 120/60 | HR 92 | Temp 97.1°F | Ht 66.0 in | Wt 265.1 lb

## 2024-06-18 DIAGNOSIS — Z794 Long term (current) use of insulin: Secondary | ICD-10-CM | POA: Diagnosis not present

## 2024-06-18 DIAGNOSIS — G8929 Other chronic pain: Secondary | ICD-10-CM | POA: Diagnosis not present

## 2024-06-18 DIAGNOSIS — Z23 Encounter for immunization: Secondary | ICD-10-CM

## 2024-06-18 DIAGNOSIS — E1159 Type 2 diabetes mellitus with other circulatory complications: Secondary | ICD-10-CM

## 2024-06-18 DIAGNOSIS — M545 Low back pain, unspecified: Secondary | ICD-10-CM

## 2024-06-18 DIAGNOSIS — I152 Hypertension secondary to endocrine disorders: Secondary | ICD-10-CM

## 2024-06-18 DIAGNOSIS — E11319 Type 2 diabetes mellitus with unspecified diabetic retinopathy without macular edema: Secondary | ICD-10-CM

## 2024-06-18 DIAGNOSIS — M25561 Pain in right knee: Secondary | ICD-10-CM

## 2024-06-18 LAB — POCT GLYCOSYLATED HEMOGLOBIN (HGB A1C): Hemoglobin A1C: 7.4 % — AB (ref 4.0–5.6)

## 2024-06-18 MED ORDER — OXYCODONE-ACETAMINOPHEN 5-325 MG PO TABS: 1.0000 | ORAL_TABLET | Freq: Three times a day (TID) | ORAL | 0 refills | Status: DC | PRN

## 2024-06-18 NOTE — Progress Notes (Signed)
 Okay   Patient ID: [name], male    DOB: Sep 25, 1950, 73 y.o.   MRN: [id number]  This visit was conducted in person.  BP 120/60   Pulse 92   Temp (!) 97.1 F (36.2 C) (Temporal)   Ht 5' 6 (1.676 m)   Wt 265 lb 2 oz (120.3 kg)   SpO2 94%   BMI 42.79 kg/m    CC: Chief Complaint  Patient presents with  . Diabetes    Subjective:   HPI: [name] is a 73 y.o. male presenting on 06/18/2024 for Diabetes  Reviewed  cardiology office visit from  05/30/2024  atrial fibrillation, ischemic cardiomyopathy HFrEF Rate controlled with carvedilol  .. Did not tolerate 12.5 mg twice daily given low BP.. now back on 6.25 mg BID On Plavix  75 mg daily, started on apixaban  for anticoagulation. On anticoagulation: apixaban  5 mg BID  He underwent successful cardioversion on 03/02/2020  Heart failure controlled with continued on carvedilol  6.25 mg twice daily, Jardiance  10 mg daily, furosemide  40 mg daily and the second dose daily as needed for weight gain of 2 pounds or greater, Entresto  24/26 mg twice daily and spironolactone  25 mg daily.    PAD, followed by vascular ... May need to  stent.... hasve upcoming appt.  Being followed by wound care center, Dr. Bethena for right calcaneus wound.  On clopidogrel  75 mg p.o. daily BP Readings from Last 3 Encounters:  06/18/24 120/60  05/30/24 124/68  05/30/24 128/78   Diabetes:   improved control on Jardiance  and Trulicity , NovoLog  and Tresiba  50  Freestyle LIbre Lab Results  Component Value Date   HGBA1C 7.4 (A) 06/18/2024  Using medications without difficulties: Hypoglycemic episodes: if skip dinner.. has a low Hyperglycemic episodes:    occ if poor diet... 1-2 times a week. Feet problems: PAD.. S/P bilateral stenting procedure.  Ulcers resolved. Blood Sugars averaging: FBS 80-90 eye exam within last year: yes  Wt Readings from Last 3 Encounters:  06/18/24 265 lb 2 oz (120.3 kg)  05/30/24 266 lb 12.8 oz (121 kg)  05/30/24 265 lb 6.4  oz (120.4 kg)    Indication for chronic opioid: chronic low back pain and knee pain  Has follow up with Ortho next week... recommending joint replacement but not a candidate given heart issues. Medication and dose: hydrocodone /acetaminophen  10/325 mg 0.5 to -1 tablets daily as needed for pain.   Requiring 3 times daily... some times has been using 4-6 a day  Tried brother in laws oxycontin  helped a lot. # pills per month: 6 Last UDS date: 05/12/2022 Opioid Treatment Agreement signed (Y/N): 05/25/23 Opioid Treatment Agreement last reviewed with patient:   NCCSRS reviewed this encounter (include red flags): Yes   PDMP reviewed during this encounter. No red flags.  Relevant past medical, surgical, family and social history reviewed and updated as indicated. Interim medical history since our last visit reviewed. Allergies and medications reviewed and updated. Outpatient Medications Prior to Visit  Medication Sig Dispense Refill  . Acetaminophen  (TYLENOL  PO) Take 3-4 tablets by mouth as needed (pain).    . apixaban  (ELIQUIS ) 5 MG TABS tablet Take 1 tablet (5 mg total) by mouth 2 (two) times daily. 60 tablet 5  . carvedilol  (COREG ) 3.125 MG tablet Take 1 tablet (3.125 mg total) by mouth 2 (two) times daily. 180 tablet 3  . Cholecalciferol  (D-3-5) 125 MCG (5000 UT) capsule Take 5,000 Units by mouth daily.    . clopidogrel  (PLAVIX ) 75 MG tablet TAKE  ONE TABLET BY MOUTH ONCE DAILY WITH BREAKFAST 90 tablet 3  . Continuous Glucose Receiver (FREESTYLE LIBRE 2 READER) DEVI USE WITH SENSORS TO MONITOR SUGAR CONTINUOUSLY 1 each 0  . Continuous Glucose Sensor (FREESTYLE LIBRE 2 PLUS SENSOR) MISC Change sensor every 15 days. 6 each 3  . cyclobenzaprine  (FLEXERIL ) 10 MG tablet TAKE ONE TABLET (10 MG TOTAL) BY MOUTH AT BEDTIME. 30 tablet 0  . Dulaglutide  (TRULICITY ) 4.5 MG/0.5ML SOAJ INJECT 4.5MG  (0.5ML) UNDER THE SKIN ONCE A WEEK 6 mL 3  . empagliflozin  (JARDIANCE ) 25 MG TABS tablet Take 1 tablet (25 mg  total) by mouth daily before breakfast. 30 tablet 11  . ENTRESTO  24-26 MG TAKE ONE TABLET BY MOUTH TWO TIMES DAILY. 60 tablet 3  . ezetimibe  (ZETIA ) 10 MG tablet Take 1 tablet (10 mg total) by mouth daily. 90 tablet 3  . furosemide  (LASIX ) 40 MG tablet TAKE ONE TABLET (40 MG TOTAL) BY MOUTH DAILY. 90 tablet 1  . gabapentin  (NEURONTIN ) 100 MG capsule TAKE ONE CAPSULE (100 MG TOTAL) BY MOUTH AT BEDTIME. 30 capsule 3  . HYDROcodone -acetaminophen  (NORCO) 10-325 MG tablet Take 0.5-1 tablets by mouth every 8 (eight) hours as needed. 90 tablet 0  . isosorbide  mononitrate (IMDUR ) 30 MG 24 hr tablet TAKE ONE TABLET BY MOUTH TWICE DAILY 180 tablet 3  . nitroGLYCERIN  (NITROSTAT ) 0.4 MG SL tablet DISSOLVE 1 TABLET UNDER TONGUE AS NEEDEDFOR CHEST PAIN. MAY REPEAT 5 MINUTES APART 3 TIMES IF NEEDED 25 tablet 3  . NOVOLOG  FLEXPEN 100 UNIT/ML FlexPen INJECT 13 UNITS UNDER THE SKIN EVERY MORNING AND THREE UNITS AS NEEDED IN THE EVENING. 15 mL 2  . spironolactone  (ALDACTONE ) 25 MG tablet TAKE ONE TABLET (25 MG TOTAL) BY MOUTH DAILY. 30 tablet 5  . TRESIBA  FLEXTOUCH 100 UNIT/ML FlexTouch Pen INJECT 50 UNITS INTO THE SKIN DAILY. 15 mL 2  . TRUEPLUS 5-BEVEL PEN NEEDLES 31G X 6 MM MISC USE TO INJECT INSULIN  THREE TIMES A DAY 300 each 3  . venlafaxine  XR (EFFEXOR -XR) 150 MG 24 hr capsule TAKE ONE CAPSULE BY MOUTH DAILY WITH BREAKFAST. TAKE WITH EFFEXOR  XR 75MG  FOR A TOTAL OF 225MG  90 capsule 1  . venlafaxine  XR (EFFEXOR -XR) 75 MG 24 hr capsule TAKE ONE CAPSULE BY MOUTH ONCE DAILY. TAKE IN ADDITION TO THE 150 MG CAPSULE FOR A TOTAL DOSE OF 225MG  DAILY 90 capsule 1  . vitamin B-12 (CYANOCOBALAMIN ) 1000 MCG tablet Take 1,000 mcg by mouth daily.     No facility-administered medications prior to visit.     Per HPI unless specifically indicated in ROS section below Review of Systems  Constitutional:  Negative for fatigue and fever.  HENT:  Negative for ear pain.   Eyes:  Negative for pain.  Respiratory:  Negative for  cough and shortness of breath.   Cardiovascular:  Negative for chest pain, palpitations and leg swelling.  Gastrointestinal:  Negative for abdominal pain.  Genitourinary:  Negative for dysuria.  Musculoskeletal:  Negative for arthralgias.  Neurological:  Negative for syncope, light-headedness and headaches.  Psychiatric/Behavioral:  Negative for dysphoric mood.    Objective:  BP 120/60   Pulse 92   Temp (!) 97.1 F (36.2 C) (Temporal)   Ht 5' 6 (1.676 m)   Wt 265 lb 2 oz (120.3 kg)   SpO2 94%   BMI 42.79 kg/m   Wt Readings from Last 3 Encounters:  06/18/24 265 lb 2 oz (120.3 kg)  05/30/24 266 lb 12.8 oz (121 kg)  05/30/24 265 lb  6.4 oz (120.4 kg)      Physical Exam Constitutional:      Appearance: He is well-developed. He is obese.  HENT:     Head: Normocephalic.     Right Ear: Hearing normal.     Left Ear: Hearing normal.     Nose: Nose normal.  Neck:     Thyroid : No thyroid  mass or thyromegaly.     Vascular: No carotid bruit.     Trachea: Trachea normal.  Cardiovascular:     Rate and Rhythm: Normal rate and regular rhythm.     Pulses: Normal pulses.     Heart sounds: Heart sounds not distant. No murmur heard.    No friction rub. No gallop.     Comments: No peripheral edema Pulmonary:     Effort: Pulmonary effort is normal. No respiratory distress.     Breath sounds: Normal breath sounds.  Musculoskeletal:     Cervical back: Pain with movement, spinous process tenderness and muscular tenderness present. Decreased range of motion.     Thoracic back: Normal.     Lumbar back: Normal.     Right knee: Swelling and effusion present. No deformity, ecchymosis or bony tenderness. Decreased range of motion. Tenderness present over the medial joint line and lateral joint line. No MCL, LCL, ACL, PCL or patellar tendon tenderness. Normal alignment.     Instability Tests: Anterior drawer test negative. Posterior drawer test negative. Anterior Lachman test negative. Medial  McMurray test negative and lateral McMurray test negative.  Skin:    General: Skin is warm and dry.     Findings: No rash.  Psychiatric:        Speech: Speech normal.        Behavior: Behavior normal.        Thought Content: Thought content normal.     Diabetic foot exam:followed by wound care currently. Bandage in place.     Results for orders placed or performed in visit on 06/18/24  POCT glycosylated hemoglobin (Hb A1C)   Collection Time: 06/18/24 11:45 AM  Result Value Ref Range   Hemoglobin A1C 7.4 (A) 4.0 - 5.6 %   HbA1c POC (<> result, manual entry)     HbA1c, POC (prediabetic range)     HbA1c, POC (controlled diabetic range)     *Note: Due to a large number of results and/or encounters for the requested time period, some results have not been displayed. A complete set of results can be found in Results Review.    This visit occurred during the SARS-CoV-2 public health emergency.  Safety protocols were in place, including screening questions prior to the visit, additional usage of staff PPE, and extensive cleaning of exam room while observing appropriate contact time as indicated for disinfecting solutions.   COVID 19 screen:  No recent travel or known exposure to COVID19 The patient denies respiratory symptoms of COVID 19 at this time. The importance of social distancing was discussed today.   Assessment and Plan    Problem List Items Addressed This Visit     Diabetes mellitus type 2 with retinopathy (HCC) - Primary   Relevant Orders   POCT glycosylated hemoglobin (Hb A1C) (Completed)   Other Visit Diagnoses       Need for influenza vaccination       Relevant Orders   Flu vaccine HIGH DOSE PF(Fluzone Trivalent) (Completed)          Greig Ring, MD

## 2024-06-21 ENCOUNTER — Telehealth: Payer: Self-pay | Admitting: *Deleted

## 2024-06-21 NOTE — Telephone Encounter (Signed)
 Noted

## 2024-06-21 NOTE — Telephone Encounter (Signed)
 Copied from CRM [id number]. Topic: General - Other >> Jun 21, 2024 10:40 AM [name] wrote: Reason for CRM: [name] called in wanting to inform [name] that the medication oxyCODONE -acetaminophen  (PERCOCET/ROXICET) 5-325 MG tablet is working great for him. [name] was told by [name] to call back after full dose to see how it works for him.

## 2024-06-24 ENCOUNTER — Other Ambulatory Visit: Payer: Self-pay | Admitting: Family Medicine

## 2024-06-24 NOTE — Telephone Encounter (Unsigned)
 Copied from CRM [id number]. Topic: General - Other >> Jun 21, 2024 10:40 AM Ahlexyia S wrote: Reason for CRM: Pt called in wanting to inform Dr. Avelina that the medication oxyCODONE -acetaminophen  (PERCOCET/ROXICET) 5-325 MG tablet is working great for him. Pt was told by Dr. Avelina to call back after full dose to see how it works for him. >> Jun 24, 2024  3:15 PM China J wrote: The patient wanted to let Dr. Avelina know that he in completely out of his oxyCODONE -acetaminophen  (PERCOCET/ROXICET) 5-325 MG tablet and would like a call from her medical assistant explaining any possible updates with getting this refilled. The patient would like this to be sent to [facility].

## 2024-06-24 NOTE — Telephone Encounter (Addendum)
 Last office visit 06/18/2024 for DM. Oxycodone  not on current medication list.  Contract has Hydrocodone -APAP. Next Appt: 09/19/2024 for DM.

## 2024-06-25 ENCOUNTER — Telehealth: Payer: Self-pay | Admitting: Family Medicine

## 2024-06-25 ENCOUNTER — Telehealth: Payer: Self-pay

## 2024-06-25 NOTE — Telephone Encounter (Signed)
 This RN attempted to contact patient. Patient did not answer. Will route to office for follow up.  Copied from CRM [id number]. Topic: Clinical - Medication Question >> Jun 25, 2024  4:13 PM Alfonso ORN wrote: Reason for CRM: Pt called back to state the oxycodone  is working great for pain but he ran out and is requesting extension on medication. He was told not to mix old rx and new one so wants guidance on what to do for pain in the meantime while waiting for refill. Please advise

## 2024-06-25 NOTE — Telephone Encounter (Signed)
 Copied from CRM [id number]. Topic: Clinical - Medication Refill >> Jun 25, 2024  4:10 PM Alfonso ORN wrote: Medication: oxyCODONE -acetaminophen  (PERCOCET/ROXICET) 5-325 MG tablet   Has the patient contacted their pharmacy? No   This is the patient's preferred pharmacy:  [facility] - [place], [place] - [address] [place] [address] Phone: [phone] Fax: [phone]

## 2024-06-25 NOTE — Telephone Encounter (Signed)
 Copied from CRM [id number]. Topic: Clinical - Medication Refill >> Jun 25, 2024  4:10 PM Alfonso ORN wrote: Medication: oxyCODONE -acetaminophen  (PERCOCET/ROXICET) 5-325 MG tablet  Has the patient contacted their pharmacy? No  This is the patient's preferred pharmacy:  [facility] - [place], [place] - [address] [place] [address] Phone: [phone] Fax: [phone]   Is this the correct pharmacy for this prescription? Yes   Has the prescription been filled recently? No  Is the patient out of the medication? Yes  Has the patient been seen for an appointment in the last year OR does the patient have an upcoming appointment? Yes  Can we respond through MyChart? No

## 2024-06-26 NOTE — Telephone Encounter (Signed)
 Refill was sent in yesterday 06/25/2024 at 4:57 pm

## 2024-06-26 NOTE — Telephone Encounter (Addendum)
 Oxycodone -APAP refill was sent in yesterday 06/25/2024 at 4:57 pm.   [name] notified of this via telephone.

## 2024-07-02 DIAGNOSIS — H3582 Retinal ischemia: Secondary | ICD-10-CM | POA: Diagnosis not present

## 2024-07-02 DIAGNOSIS — H34813 Central retinal vein occlusion, bilateral, with macular edema: Secondary | ICD-10-CM | POA: Diagnosis not present

## 2024-07-02 DIAGNOSIS — E113393 Type 2 diabetes mellitus with moderate nonproliferative diabetic retinopathy without macular edema, bilateral: Secondary | ICD-10-CM | POA: Diagnosis not present

## 2024-07-02 DIAGNOSIS — H35033 Hypertensive retinopathy, bilateral: Secondary | ICD-10-CM | POA: Diagnosis not present

## 2024-07-02 DIAGNOSIS — H35372 Puckering of macula, left eye: Secondary | ICD-10-CM | POA: Diagnosis not present

## 2024-07-02 DIAGNOSIS — H3563 Retinal hemorrhage, bilateral: Secondary | ICD-10-CM | POA: Diagnosis not present

## 2024-07-04 ENCOUNTER — Telehealth: Payer: Self-pay

## 2024-07-04 ENCOUNTER — Other Ambulatory Visit (HOSPITAL_COMMUNITY): Payer: Self-pay

## 2024-07-04 ENCOUNTER — Other Ambulatory Visit: Payer: Self-pay | Admitting: Family Medicine

## 2024-07-04 NOTE — Telephone Encounter (Signed)
 Pharmacy Patient Advocate Encounter   Received notification from Onbase that prior authorization for Cyclobenzaprine  HCl 10MG  tablets  is required/requested.   Insurance verification completed.   The patient is insured through [facility] ADVANTAGE/RX ADVANCE.   Per test claim: PA required; PA submitted to above mentioned insurance via Latent Key/confirmation #/EOC [id number] Status is pending

## 2024-07-05 ENCOUNTER — Other Ambulatory Visit (HOSPITAL_COMMUNITY): Payer: Self-pay

## 2024-07-07 NOTE — Assessment & Plan Note (Signed)
 Chronic, almost at goal A1c less than 7.  Trulicity  4.5 mg weekly. Tresiba  55 units and NovoLog   13 untis in Am and 3 units later in day prn Jardiance   25 mg p.o. daily   Reevaluate in 3 months.   Associated with retinopathy.

## 2024-07-07 NOTE — Assessment & Plan Note (Signed)
 Chronic, well-controlled in office today  Coreg 6.25 milligrams p.o. twice daily, losartan 50 mg p.o. daily  Lasix 40 mg daily Spironolactone 25 mg p.o. daily  Entresto 24/26 mg daily

## 2024-07-07 NOTE — Assessment & Plan Note (Signed)
 Knee pain now chronic.  Followed by orthopedics but treatment so far unsuccessful.  Not a good candidate for knee replacement. Patient request better pain control. Hydrocodone  10/325 mg 1 to 2 tablets 3 times daily unhelpful with pain. Recommended referral to pain center for additional recommendations In meantime we will change to oxycodone /APAP 1 tablet 3 times a day as needed for pain.  PDMP reviewed during this encounter.

## 2024-07-09 ENCOUNTER — Other Ambulatory Visit (HOSPITAL_COMMUNITY): Payer: Self-pay

## 2024-07-09 NOTE — Telephone Encounter (Signed)
 Pharmacy Patient Advocate Encounter  Received notification from [facility] [employer] that Prior Authorization for Cyclobenzaprine  HCl 10MG  tablets   has been APPROVED from 07/09/2024 to 08/06/2024   PA #/Case ID/Reference #: [id number]

## 2024-07-12 ENCOUNTER — Other Ambulatory Visit: Payer: Self-pay | Admitting: Family

## 2024-07-18 ENCOUNTER — Telehealth: Payer: Self-pay

## 2024-07-18 NOTE — Patient Instructions (Signed)
 [name] - I am sorry I was unable to reach you today for our scheduled appointment. I work with Avelina Greig BRAVO, MD and am calling to support your healthcare needs. Please contact me at [phone] at your earliest convenience. I look forward to speaking with you soon.   Thank you,  Nestora Duos, MSN, RN [facility]  [facility], [facility] RN Care Manager Direct Dial: [phone] Fax: [phone]

## 2024-07-22 ENCOUNTER — Other Ambulatory Visit: Payer: Self-pay | Admitting: Family Medicine

## 2024-07-22 NOTE — Telephone Encounter (Signed)
 Last office visit 06/18/2024 for DM.  Last refilled 06/25/2024 for #90 with no refills.  Next appt: 09/19/24 for DM.  UDS/Contract 05/18/2023.

## 2024-07-29 ENCOUNTER — Other Ambulatory Visit: Payer: Self-pay | Admitting: Family Medicine

## 2024-07-29 ENCOUNTER — Telehealth: Payer: Self-pay | Admitting: Family Medicine

## 2024-07-29 NOTE — Patient Outreach (Signed)
 RNCM - patent declined to reschedule missed appointment, states doing well and no longer needing RNCM services. Aware can request new referral as needed and PCP notified of closure.

## 2024-07-29 NOTE — Patient Instructions (Signed)
 Visit Information  Thank you for taking time to visit with me today. Please don't hesitate to contact me if I can be of assistance to you before our next scheduled appointment.  Your next care management appointment is no further scheduled appointments.    Closing From: Complex Care Management.  Please call the care guide team at [phone] if you need to cancel, schedule, or reschedule an appointment.   Please call the Suicide and Crisis Lifeline: 988 call the [place]  National Suicide Prevention Lifeline: 508 073 6807 or TTY: (915)023-5940 TTY 4407023144) to talk to a trained counselor call 1-800-273-TALK (toll free, 24 hour hotline) go to [facility] [address] [place] [phone]) call 911 if you are experiencing a Mental Health or Behavioral Health Crisis or need someone to talk to.   Nestora Duos, MSN, RN [facility], [facility] Health RN Care Manager Direct Dial: [phone] Fax: [phone]

## 2024-07-29 NOTE — Telephone Encounter (Signed)
 Copied from CRM [id number]. Topic: General - Other >> Jul 29, 2024 12:25 PM Dedra B wrote: Reason for CRM: Pt returning call for population health nurse, Nestora Duos.

## 2024-08-09 ENCOUNTER — Telehealth: Payer: Self-pay

## 2024-08-09 ENCOUNTER — Other Ambulatory Visit: Payer: Self-pay | Admitting: Family

## 2024-08-09 NOTE — Telephone Encounter (Signed)
 Called pt to schedule overdue follow up. lvm

## 2024-08-15 ENCOUNTER — Other Ambulatory Visit: Payer: Self-pay | Admitting: Family Medicine

## 2024-08-15 NOTE — Telephone Encounter (Signed)
 Last office visit 06/18/2024 for DM.  Last refilled 07/23/2024 for #90 with no refills.  UDS/Contract 05/18/2023.  Next Appt: 09/19/2024 for DM.

## 2024-08-27 ENCOUNTER — Other Ambulatory Visit: Payer: Self-pay | Admitting: Family Medicine

## 2024-09-05 ENCOUNTER — Telehealth: Payer: Self-pay | Admitting: Family

## 2024-09-05 NOTE — Telephone Encounter (Signed)
 Called to confirm/remind patient of their appointment at the [facility] on 09/06/24.   Appointment:   [x] Confirmed  [] Left mess   [] No answer/No voice mail  [] VM Full/unable to leave message  [] Phone not in service  Patient reminded to bring all medications and/or complete list.  Confirmed patient has transportation. Gave directions, instructed to utilize valet parking.
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
## 2024-09-06 ENCOUNTER — Encounter: Payer: Self-pay | Admitting: Family

## 2024-09-06 ENCOUNTER — Ambulatory Visit: Attending: Family | Admitting: Family

## 2024-09-06 VITALS — BP 108/51 | HR 74 | Wt 266.8 lb

## 2024-09-06 DIAGNOSIS — I509 Heart failure, unspecified: Secondary | ICD-10-CM | POA: Diagnosis not present

## 2024-09-06 DIAGNOSIS — I952 Hypotension due to drugs: Secondary | ICD-10-CM | POA: Diagnosis not present

## 2024-09-06 DIAGNOSIS — Z7902 Long term (current) use of antithrombotics/antiplatelets: Secondary | ICD-10-CM | POA: Insufficient documentation

## 2024-09-06 DIAGNOSIS — Z794 Long term (current) use of insulin: Secondary | ICD-10-CM | POA: Insufficient documentation

## 2024-09-06 DIAGNOSIS — F32A Depression, unspecified: Secondary | ICD-10-CM | POA: Diagnosis not present

## 2024-09-06 DIAGNOSIS — Z955 Presence of coronary angioplasty implant and graft: Secondary | ICD-10-CM | POA: Insufficient documentation

## 2024-09-06 DIAGNOSIS — I130 Hypertensive heart and chronic kidney disease with heart failure and stage 1 through stage 4 chronic kidney disease, or unspecified chronic kidney disease: Secondary | ICD-10-CM | POA: Diagnosis present

## 2024-09-06 DIAGNOSIS — I10 Essential (primary) hypertension: Secondary | ICD-10-CM | POA: Diagnosis not present

## 2024-09-06 DIAGNOSIS — Z555 Less than a high school diploma: Secondary | ICD-10-CM | POA: Insufficient documentation

## 2024-09-06 DIAGNOSIS — E785 Hyperlipidemia, unspecified: Secondary | ICD-10-CM | POA: Diagnosis not present

## 2024-09-06 DIAGNOSIS — N189 Chronic kidney disease, unspecified: Secondary | ICD-10-CM | POA: Diagnosis not present

## 2024-09-06 DIAGNOSIS — Z7985 Long-term (current) use of injectable non-insulin antidiabetic drugs: Secondary | ICD-10-CM | POA: Diagnosis not present

## 2024-09-06 DIAGNOSIS — I739 Peripheral vascular disease, unspecified: Secondary | ICD-10-CM

## 2024-09-06 DIAGNOSIS — I480 Paroxysmal atrial fibrillation: Secondary | ICD-10-CM | POA: Insufficient documentation

## 2024-09-06 DIAGNOSIS — I255 Ischemic cardiomyopathy: Secondary | ICD-10-CM | POA: Diagnosis not present

## 2024-09-06 DIAGNOSIS — Z7901 Long term (current) use of anticoagulants: Secondary | ICD-10-CM | POA: Diagnosis not present

## 2024-09-06 DIAGNOSIS — Z7984 Long term (current) use of oral hypoglycemic drugs: Secondary | ICD-10-CM | POA: Insufficient documentation

## 2024-09-06 DIAGNOSIS — I5032 Chronic diastolic (congestive) heart failure: Secondary | ICD-10-CM

## 2024-09-06 DIAGNOSIS — R5383 Other fatigue: Secondary | ICD-10-CM | POA: Diagnosis not present

## 2024-09-06 DIAGNOSIS — I252 Old myocardial infarction: Secondary | ICD-10-CM | POA: Diagnosis not present

## 2024-09-06 DIAGNOSIS — E11319 Type 2 diabetes mellitus with unspecified diabetic retinopathy without macular edema: Secondary | ICD-10-CM

## 2024-09-06 DIAGNOSIS — Z79899 Other long term (current) drug therapy: Secondary | ICD-10-CM | POA: Diagnosis not present

## 2024-09-06 DIAGNOSIS — I2582 Chronic total occlusion of coronary artery: Secondary | ICD-10-CM | POA: Diagnosis not present

## 2024-09-06 DIAGNOSIS — E1122 Type 2 diabetes mellitus with diabetic chronic kidney disease: Secondary | ICD-10-CM | POA: Diagnosis not present

## 2024-09-06 DIAGNOSIS — I2510 Atherosclerotic heart disease of native coronary artery without angina pectoris: Secondary | ICD-10-CM | POA: Insufficient documentation

## 2024-09-06 DIAGNOSIS — T447X5A Adverse effect of beta-adrenoreceptor antagonists, initial encounter: Secondary | ICD-10-CM | POA: Diagnosis not present

## 2024-09-06 DIAGNOSIS — E1151 Type 2 diabetes mellitus with diabetic peripheral angiopathy without gangrene: Secondary | ICD-10-CM | POA: Insufficient documentation

## 2024-09-08 ENCOUNTER — Emergency Department
Admission: EM | Admit: 2024-09-08 | Discharge: 2024-09-08 | Disposition: A | Discharge status: Home / Self Care | Attending: Emergency Medicine | Admitting: Emergency Medicine

## 2024-09-08 ENCOUNTER — Emergency Department

## 2024-09-08 ENCOUNTER — Other Ambulatory Visit: Payer: Self-pay

## 2024-09-08 DIAGNOSIS — I700 Atherosclerosis of aorta: Secondary | ICD-10-CM | POA: Diagnosis not present

## 2024-09-08 DIAGNOSIS — Z23 Encounter for immunization: Secondary | ICD-10-CM | POA: Diagnosis not present

## 2024-09-08 DIAGNOSIS — S0990XA Unspecified injury of head, initial encounter: Secondary | ICD-10-CM | POA: Diagnosis not present

## 2024-09-08 DIAGNOSIS — S7012XA Contusion of left thigh, initial encounter: Secondary | ICD-10-CM | POA: Diagnosis not present

## 2024-09-08 DIAGNOSIS — Y92410 Unspecified street and highway as the place of occurrence of the external cause: Secondary | ICD-10-CM | POA: Diagnosis not present

## 2024-09-08 DIAGNOSIS — I509 Heart failure, unspecified: Secondary | ICD-10-CM | POA: Diagnosis not present

## 2024-09-08 DIAGNOSIS — S51012A Laceration without foreign body of left elbow, initial encounter: Secondary | ICD-10-CM | POA: Insufficient documentation

## 2024-09-08 DIAGNOSIS — Z794 Long term (current) use of insulin: Secondary | ICD-10-CM | POA: Insufficient documentation

## 2024-09-08 DIAGNOSIS — S3991XA Unspecified injury of abdomen, initial encounter: Secondary | ICD-10-CM | POA: Insufficient documentation

## 2024-09-08 DIAGNOSIS — I110 Hypertensive heart disease with heart failure: Secondary | ICD-10-CM | POA: Diagnosis not present

## 2024-09-08 DIAGNOSIS — E119 Type 2 diabetes mellitus without complications: Secondary | ICD-10-CM | POA: Insufficient documentation

## 2024-09-08 LAB — COMPREHENSIVE METABOLIC PANEL WITH GFR
ALT: 12 U/L (ref 0–44)
AST: 16 U/L (ref 15–41)
Albumin: 4.1 g/dL (ref 3.5–5.0)
Alkaline Phosphatase: 96 U/L (ref 38–126)
Anion gap: 12 (ref 5–15)
BUN: 19 mg/dL (ref 8–23)
CO2: 26 mmol/L (ref 22–32)
Calcium: 9 mg/dL (ref 8.9–10.3)
Chloride: 103 mmol/L (ref 98–111)
Creatinine, Ser: 1.05 mg/dL (ref 0.61–1.24)
GFR, Estimated: >60 mL/min
Glucose, Bld: 223 mg/dL — ABNORMAL HIGH (ref 70–99)
Potassium: 4.1 mmol/L (ref 3.5–5.1)
Sodium: 141 mmol/L (ref 135–145)
Total Bilirubin: 0.3 mg/dL (ref 0.0–1.2)
Total Protein: 7.2 g/dL (ref 6.5–8.1)

## 2024-09-08 LAB — CBC
HCT: 43.1 % (ref 39.0–52.0)
Hemoglobin: 14 g/dL (ref 13.0–17.0)
MCH: 29.7 pg (ref 26.0–34.0)
MCHC: 32.5 g/dL (ref 30.0–36.0)
MCV: 91.3 fL (ref 80.0–100.0)
Platelets: 244 K/uL (ref 150–400)
RBC: 4.72 MIL/uL (ref 4.22–5.81)
RDW: 14.1 % (ref 11.5–15.5)
WBC: 9.5 K/uL (ref 4.0–10.5)
nRBC: 0 % (ref 0.0–0.2)

## 2024-09-08 LAB — SAMPLE TO BLOOD BANK

## 2024-09-08 LAB — PROTIME-INR
INR: 1.2 (ref 0.8–1.2)
Prothrombin Time: 15.8 s — ABNORMAL HIGH (ref 11.4–15.2)

## 2024-09-08 LAB — LACTIC ACID, PLASMA: Lactic Acid, Venous: 1.6 mmol/L (ref 0.5–1.9)

## 2024-09-08 MED ORDER — IOHEXOL 300 MG/ML  SOLN
100.0000 mL | Freq: Once | INTRAMUSCULAR | Status: AC | PRN
  Administered 2024-09-08: 100 mL via INTRAVENOUS

## 2024-09-08 MED ORDER — TETANUS-DIPHTH-ACELL PERTUSSIS 5-2-15.5 LF-MCG/0.5 IM SUSP
0.5000 mL | Freq: Once | INTRAMUSCULAR | Status: AC
  Administered 2024-09-08: 0.5 mL via INTRAMUSCULAR
  Filled 2024-09-08: qty 0.5
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
## 2024-09-08 NOTE — ED Notes (Signed)
 Pt transported to CT via W/C. Per Dr. Viviann, do not need to wait for labs to result to proceed with CT scan with contrast.
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
## 2024-09-08 NOTE — ED Notes (Signed)
 Pt has some abrasions on his left arm that have been covered with bandages

## 2024-09-09 ENCOUNTER — Telehealth: Payer: Self-pay

## 2024-09-09 NOTE — Telephone Encounter (Signed)
 Spoke to pt about itamar sleep study. Made pt aware that we are unable to find his previous sleep study. Pt states that he can come pick up another sleep study. Advised to call when he is on the way.

## 2024-09-11 ENCOUNTER — Inpatient Hospital Stay: Admitting: Family Medicine

## 2024-09-13 ENCOUNTER — Ambulatory Visit: Payer: Self-pay

## 2024-09-13 MED ORDER — OXYCODONE-ACETAMINOPHEN 5-325 MG PO TABS: 1.0000 | ORAL_TABLET | Freq: Three times a day (TID) | ORAL | 0 refills | Status: DC | PRN

## 2024-09-13 NOTE — Telephone Encounter (Signed)
 Next Appt With Family Medicine Darra Ring, MD) 09/19/2024 at 2:20 PM  Seen in ER 09/08/2024.  Last office visit 06/18/2024 for DM.  Last refilled 08/15/2024 for #90 with no refills.  Next Appt: 09/19/2024.    I think patient is really just asking for refill on his pain medicine.
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
## 2024-09-18 ENCOUNTER — Other Ambulatory Visit: Payer: Self-pay | Admitting: Family Medicine

## 2024-09-18 NOTE — Telephone Encounter (Signed)
 Please resend refill.  Last one on 09/13/24 was set on phone in.

## 2024-09-19 ENCOUNTER — Ambulatory Visit: Admitting: Family Medicine

## 2024-09-26 ENCOUNTER — Telehealth: Payer: Self-pay

## 2024-09-26 ENCOUNTER — Other Ambulatory Visit (HOSPITAL_COMMUNITY): Payer: Self-pay

## 2024-09-26 ENCOUNTER — Encounter (HOSPITAL_COMMUNITY): Payer: Self-pay

## 2024-09-26 NOTE — Telephone Encounter (Signed)
 Advanced Heart Failure Patient Advocate Encounter  This patient was renewed for a [employer] grant that will help cover the cost of Carvedilol , Eliquis , Entresto , Jardiance , Spironolactone .  Total amount awarded, $7,500.  Effective: 08/27/2024 - 08/26/2025.  BIN [id number] PCN [id number] [id number] ID [id number]  Pharmacy provided with approval and processing information. Patient informed via MyChart.  Rachel DEL, CPhT Rx Patient Advocate Phone: [phone]

## 2024-10-01 ENCOUNTER — Ambulatory Visit: Admitting: Family Medicine

## 2024-10-15 ENCOUNTER — Ambulatory Visit: Admitting: Student in an Organized Health Care Education/Training Program

## 2024-10-29 ENCOUNTER — Ambulatory Visit: Admitting: Family Medicine

## 2024-11-05 ENCOUNTER — Ambulatory Visit

## 2024-11-28 ENCOUNTER — Ambulatory Visit (INDEPENDENT_AMBULATORY_CARE_PROVIDER_SITE_OTHER): Admitting: Nurse Practitioner

## 2024-11-28 ENCOUNTER — Encounter (INDEPENDENT_AMBULATORY_CARE_PROVIDER_SITE_OTHER)

## 2024-11-28 ENCOUNTER — Ambulatory Visit: Admitting: Cardiology

## 2025-03-06 ENCOUNTER — Ambulatory Visit: Admitting: Family
